# Patient Record
Sex: Female | Born: 1965
Health system: Southern US, Community
[De-identification: ages and names within clinical notes are randomized; demographics above are authoritative.]

## PROBLEM LIST (undated history)

## (undated) DIAGNOSIS — M545 Low back pain, unspecified: Secondary | ICD-10-CM

## (undated) DIAGNOSIS — R000 Tachycardia, unspecified: Secondary | ICD-10-CM

## (undated) DIAGNOSIS — E11621 Type 2 diabetes mellitus with foot ulcer: Secondary | ICD-10-CM

## (undated) DIAGNOSIS — E538 Deficiency of other specified B group vitamins: Secondary | ICD-10-CM

## (undated) DIAGNOSIS — T148XXA Other injury of unspecified body region, initial encounter: Secondary | ICD-10-CM

## (undated) DIAGNOSIS — G5750 Tarsal tunnel syndrome, unspecified lower limb: Secondary | ICD-10-CM

## (undated) DIAGNOSIS — Z8619 Personal history of other infectious and parasitic diseases: Secondary | ICD-10-CM

## (undated) DIAGNOSIS — J449 Chronic obstructive pulmonary disease, unspecified: Secondary | ICD-10-CM

## (undated) DIAGNOSIS — Z794 Long term (current) use of insulin: Secondary | ICD-10-CM

## (undated) DIAGNOSIS — M1990 Unspecified osteoarthritis, unspecified site: Secondary | ICD-10-CM

## (undated) DIAGNOSIS — E039 Hypothyroidism, unspecified: Secondary | ICD-10-CM

## (undated) DIAGNOSIS — Z87898 Personal history of other specified conditions: Secondary | ICD-10-CM

## (undated) DIAGNOSIS — E119 Type 2 diabetes mellitus without complications: Secondary | ICD-10-CM

## (undated) DIAGNOSIS — Z973 Presence of spectacles and contact lenses: Secondary | ICD-10-CM

## (undated) DIAGNOSIS — G629 Polyneuropathy, unspecified: Secondary | ICD-10-CM

## (undated) DIAGNOSIS — F419 Anxiety disorder, unspecified: Secondary | ICD-10-CM

## (undated) DIAGNOSIS — M659 Synovitis and tenosynovitis, unspecified: Secondary | ICD-10-CM

## (undated) DIAGNOSIS — L97519 Non-pressure chronic ulcer of other part of right foot with unspecified severity: Secondary | ICD-10-CM

## (undated) DIAGNOSIS — E785 Hyperlipidemia, unspecified: Secondary | ICD-10-CM

## (undated) DIAGNOSIS — R55 Syncope and collapse: Secondary | ICD-10-CM

## (undated) DIAGNOSIS — I4510 Unspecified right bundle-branch block: Secondary | ICD-10-CM

## (undated) DIAGNOSIS — Z8489 Family history of other specified conditions: Secondary | ICD-10-CM

## (undated) DIAGNOSIS — M5382 Other specified dorsopathies, cervical region: Secondary | ICD-10-CM

## (undated) DIAGNOSIS — G894 Chronic pain syndrome: Secondary | ICD-10-CM

## (undated) DIAGNOSIS — K5909 Other constipation: Secondary | ICD-10-CM

## (undated) HISTORY — DX: Tarsal tunnel syndrome, unspecified lower limb: G57.50

## (undated) HISTORY — DX: Syncope and collapse: R55

## (undated) HISTORY — PX: ABDOMINAL HYSTERECTOMY: SHX81

## (undated) HISTORY — PX: ANTERIOR CERVICAL DECOMP/DISCECTOMY FUSION: SHX1161

## (undated) HISTORY — DX: Synovitis and tenosynovitis, unspecified: M65.9

## (undated) HISTORY — PX: CARDIAC CATHETERIZATION: SHX172

## (undated) HISTORY — PX: HARDWARE REMOVAL: SHX979

## (undated) HISTORY — DX: Anxiety disorder, unspecified: F41.9

## (undated) HISTORY — PX: CERVICAL FUSION: SHX112

## (undated) HISTORY — DX: Type 2 diabetes mellitus without complications: E11.9

## (undated) HISTORY — DX: Hyperlipidemia, unspecified: E78.5

## (undated) HISTORY — DX: Low back pain: M54.5

## (undated) HISTORY — PX: CARPAL TUNNEL RELEASE: SHX101

## (undated) HISTORY — PX: ORIF METATARSAL FRACTURE: SUR942

## (undated) HISTORY — DX: Low back pain, unspecified: M54.50

## (undated) HISTORY — PX: TOTAL ABDOMINAL HYSTERECTOMY W/ BILATERAL SALPINGOOPHORECTOMY: SHX83

## (undated) HISTORY — PX: LUMBAR DISC SURGERY: SHX700

## (undated) HISTORY — DX: Chronic obstructive pulmonary disease, unspecified: J44.9

## (undated) HISTORY — DX: Tachycardia, unspecified: R00.0

## (undated) HISTORY — DX: Hypothyroidism, unspecified: E03.9

## (undated) HISTORY — DX: Polyneuropathy, unspecified: G62.9

---

## 1998-09-08 ENCOUNTER — Other Ambulatory Visit: Admission: RE | Admit: 1998-09-08 | Discharge: 1998-09-08 | Payer: Self-pay | Admitting: Obstetrics and Gynecology

## 1999-04-05 ENCOUNTER — Ambulatory Visit (HOSPITAL_COMMUNITY): Admission: RE | Admit: 1999-04-05 | Discharge: 1999-04-05 | Payer: Self-pay | Admitting: Internal Medicine

## 1999-04-05 ENCOUNTER — Encounter: Payer: Self-pay | Admitting: Internal Medicine

## 1999-11-02 ENCOUNTER — Encounter: Admission: RE | Admit: 1999-11-02 | Discharge: 1999-11-02 | Payer: Self-pay | Admitting: Internal Medicine

## 1999-11-02 ENCOUNTER — Encounter: Payer: Self-pay | Admitting: Internal Medicine

## 2000-01-12 ENCOUNTER — Encounter: Payer: Self-pay | Admitting: Internal Medicine

## 2000-01-12 ENCOUNTER — Ambulatory Visit (HOSPITAL_COMMUNITY): Admission: RE | Admit: 2000-01-12 | Discharge: 2000-01-12 | Payer: Self-pay | Admitting: Internal Medicine

## 2000-02-04 ENCOUNTER — Encounter: Payer: Self-pay | Admitting: Emergency Medicine

## 2000-02-04 ENCOUNTER — Emergency Department (HOSPITAL_COMMUNITY): Admission: RE | Admit: 2000-02-04 | Discharge: 2000-02-04 | Payer: Self-pay | Admitting: Internal Medicine

## 2000-12-28 ENCOUNTER — Encounter: Admission: RE | Admit: 2000-12-28 | Discharge: 2000-12-28 | Payer: Self-pay | Admitting: Obstetrics and Gynecology

## 2000-12-28 ENCOUNTER — Encounter: Payer: Self-pay | Admitting: Obstetrics and Gynecology

## 2001-01-27 ENCOUNTER — Emergency Department (HOSPITAL_COMMUNITY): Admission: EM | Admit: 2001-01-27 | Discharge: 2001-01-27 | Payer: Self-pay | Admitting: Emergency Medicine

## 2001-01-27 ENCOUNTER — Encounter: Payer: Self-pay | Admitting: Emergency Medicine

## 2001-01-29 ENCOUNTER — Emergency Department (HOSPITAL_COMMUNITY): Admission: EM | Admit: 2001-01-29 | Discharge: 2001-01-29 | Payer: Self-pay | Admitting: Emergency Medicine

## 2001-03-15 ENCOUNTER — Encounter: Admission: RE | Admit: 2001-03-15 | Discharge: 2001-06-13 | Payer: Self-pay | Admitting: Internal Medicine

## 2001-05-25 ENCOUNTER — Observation Stay (HOSPITAL_COMMUNITY): Admission: EM | Admit: 2001-05-25 | Discharge: 2001-05-26 | Payer: Self-pay

## 2001-05-26 ENCOUNTER — Encounter: Payer: Self-pay | Admitting: Internal Medicine

## 2001-07-04 ENCOUNTER — Encounter: Admission: RE | Admit: 2001-07-04 | Discharge: 2001-10-02 | Payer: Self-pay | Admitting: Internal Medicine

## 2001-10-18 ENCOUNTER — Encounter: Admission: RE | Admit: 2001-10-18 | Discharge: 2002-01-16 | Payer: Self-pay | Admitting: Internal Medicine

## 2002-01-10 ENCOUNTER — Observation Stay (HOSPITAL_COMMUNITY): Admission: EM | Admit: 2002-01-10 | Discharge: 2002-01-14 | Payer: Self-pay | Admitting: *Deleted

## 2002-01-13 ENCOUNTER — Encounter: Payer: Self-pay | Admitting: Internal Medicine

## 2002-01-17 ENCOUNTER — Inpatient Hospital Stay (HOSPITAL_COMMUNITY): Admission: AD | Admit: 2002-01-17 | Discharge: 2002-01-24 | Payer: Self-pay | Admitting: Internal Medicine

## 2002-03-22 ENCOUNTER — Other Ambulatory Visit: Admission: RE | Admit: 2002-03-22 | Discharge: 2002-03-22 | Payer: Self-pay | Admitting: Obstetrics and Gynecology

## 2002-05-07 ENCOUNTER — Emergency Department (HOSPITAL_COMMUNITY): Admission: EM | Admit: 2002-05-07 | Discharge: 2002-05-07 | Payer: Self-pay | Admitting: Emergency Medicine

## 2002-05-24 ENCOUNTER — Observation Stay (HOSPITAL_COMMUNITY): Admission: EM | Admit: 2002-05-24 | Discharge: 2002-05-25 | Payer: Self-pay | Admitting: Emergency Medicine

## 2002-05-25 ENCOUNTER — Encounter: Payer: Self-pay | Admitting: Cardiology

## 2002-05-27 ENCOUNTER — Encounter: Payer: Self-pay | Admitting: Cardiology

## 2002-05-27 ENCOUNTER — Ambulatory Visit (HOSPITAL_COMMUNITY): Admission: RE | Admit: 2002-05-27 | Discharge: 2002-05-27 | Payer: Self-pay | Admitting: Cardiology

## 2002-06-19 ENCOUNTER — Emergency Department (HOSPITAL_COMMUNITY): Admission: EM | Admit: 2002-06-19 | Discharge: 2002-06-19 | Payer: Self-pay | Admitting: Emergency Medicine

## 2002-06-25 ENCOUNTER — Encounter: Payer: Self-pay | Admitting: Internal Medicine

## 2002-06-25 ENCOUNTER — Encounter: Admission: RE | Admit: 2002-06-25 | Discharge: 2002-06-25 | Payer: Self-pay | Admitting: Internal Medicine

## 2002-07-12 ENCOUNTER — Emergency Department (HOSPITAL_COMMUNITY): Admission: EM | Admit: 2002-07-12 | Discharge: 2002-07-12 | Payer: Self-pay | Admitting: Emergency Medicine

## 2002-12-30 ENCOUNTER — Encounter: Admission: RE | Admit: 2002-12-30 | Discharge: 2003-03-30 | Payer: Self-pay | Admitting: Internal Medicine

## 2004-01-16 ENCOUNTER — Encounter: Admission: RE | Admit: 2004-01-16 | Discharge: 2004-01-16 | Payer: Self-pay | Admitting: Neurology

## 2004-01-17 ENCOUNTER — Encounter: Admission: RE | Admit: 2004-01-17 | Discharge: 2004-01-17 | Payer: Self-pay | Admitting: Neurology

## 2004-02-12 ENCOUNTER — Ambulatory Visit (HOSPITAL_COMMUNITY): Admission: RE | Admit: 2004-02-12 | Discharge: 2004-02-13 | Payer: Self-pay | Admitting: Neurosurgery

## 2004-04-26 ENCOUNTER — Encounter: Admission: RE | Admit: 2004-04-26 | Discharge: 2004-06-29 | Payer: Self-pay | Admitting: Neurosurgery

## 2004-06-23 ENCOUNTER — Other Ambulatory Visit: Admission: RE | Admit: 2004-06-23 | Discharge: 2004-06-23 | Payer: Self-pay | Admitting: Obstetrics and Gynecology

## 2004-07-27 ENCOUNTER — Encounter: Admission: RE | Admit: 2004-07-27 | Discharge: 2004-08-26 | Payer: Self-pay | Admitting: Internal Medicine

## 2004-09-02 ENCOUNTER — Encounter: Admission: RE | Admit: 2004-09-02 | Discharge: 2004-11-15 | Payer: Self-pay | Admitting: General Practice

## 2004-09-13 ENCOUNTER — Ambulatory Visit: Payer: Self-pay | Admitting: Internal Medicine

## 2004-09-13 ENCOUNTER — Ambulatory Visit (HOSPITAL_COMMUNITY): Admission: RE | Admit: 2004-09-13 | Discharge: 2004-09-13 | Payer: Self-pay | Admitting: Internal Medicine

## 2004-11-15 ENCOUNTER — Ambulatory Visit: Payer: Self-pay | Admitting: Internal Medicine

## 2004-11-17 ENCOUNTER — Ambulatory Visit: Payer: Self-pay | Admitting: Internal Medicine

## 2004-11-22 ENCOUNTER — Encounter: Admission: RE | Admit: 2004-11-22 | Discharge: 2005-01-13 | Payer: Self-pay | Admitting: Internal Medicine

## 2005-01-17 ENCOUNTER — Ambulatory Visit: Payer: Self-pay | Admitting: Internal Medicine

## 2005-01-26 ENCOUNTER — Ambulatory Visit: Payer: Self-pay | Admitting: Internal Medicine

## 2005-02-21 ENCOUNTER — Ambulatory Visit: Payer: Self-pay | Admitting: Internal Medicine

## 2005-03-04 ENCOUNTER — Encounter: Admission: RE | Admit: 2005-03-04 | Discharge: 2005-06-02 | Payer: Self-pay | Admitting: Anesthesiology

## 2005-03-21 ENCOUNTER — Ambulatory Visit: Payer: Self-pay | Admitting: Internal Medicine

## 2005-03-25 ENCOUNTER — Ambulatory Visit: Payer: Self-pay | Admitting: Internal Medicine

## 2005-03-28 ENCOUNTER — Emergency Department (HOSPITAL_COMMUNITY): Admission: EM | Admit: 2005-03-28 | Discharge: 2005-03-28 | Payer: Self-pay | Admitting: Emergency Medicine

## 2005-05-16 ENCOUNTER — Ambulatory Visit: Payer: Self-pay | Admitting: Internal Medicine

## 2005-05-19 ENCOUNTER — Ambulatory Visit: Payer: Self-pay

## 2005-05-23 ENCOUNTER — Ambulatory Visit: Payer: Self-pay | Admitting: Internal Medicine

## 2005-05-24 ENCOUNTER — Ambulatory Visit (HOSPITAL_COMMUNITY): Admission: RE | Admit: 2005-05-24 | Discharge: 2005-05-24 | Payer: Self-pay | Admitting: Internal Medicine

## 2005-05-30 ENCOUNTER — Ambulatory Visit: Payer: Self-pay | Admitting: Internal Medicine

## 2005-06-29 ENCOUNTER — Ambulatory Visit: Payer: Self-pay | Admitting: Internal Medicine

## 2005-07-05 ENCOUNTER — Emergency Department (HOSPITAL_COMMUNITY): Admission: EM | Admit: 2005-07-05 | Discharge: 2005-07-06 | Payer: Self-pay | Admitting: *Deleted

## 2005-07-07 ENCOUNTER — Ambulatory Visit (HOSPITAL_COMMUNITY): Admission: RE | Admit: 2005-07-07 | Discharge: 2005-07-08 | Payer: Self-pay | Admitting: Orthopaedic Surgery

## 2005-08-22 ENCOUNTER — Ambulatory Visit: Payer: Self-pay | Admitting: Internal Medicine

## 2005-10-10 ENCOUNTER — Ambulatory Visit: Payer: Self-pay | Admitting: Internal Medicine

## 2005-10-12 ENCOUNTER — Encounter: Admission: RE | Admit: 2005-10-12 | Discharge: 2005-11-28 | Payer: Self-pay | Admitting: Orthopaedic Surgery

## 2005-12-12 ENCOUNTER — Ambulatory Visit: Payer: Self-pay | Admitting: Internal Medicine

## 2006-02-06 ENCOUNTER — Encounter: Admission: RE | Admit: 2006-02-06 | Discharge: 2006-02-06 | Payer: Self-pay | Admitting: Vascular Surgery

## 2006-02-14 ENCOUNTER — Ambulatory Visit: Payer: Self-pay | Admitting: Internal Medicine

## 2006-02-27 ENCOUNTER — Encounter: Admission: RE | Admit: 2006-02-27 | Discharge: 2006-03-16 | Payer: Self-pay | Admitting: Internal Medicine

## 2006-04-24 ENCOUNTER — Encounter: Admission: RE | Admit: 2006-04-24 | Discharge: 2006-04-24 | Payer: Self-pay | Admitting: Orthopaedic Surgery

## 2006-05-29 ENCOUNTER — Ambulatory Visit: Payer: Self-pay | Admitting: Internal Medicine

## 2006-06-09 ENCOUNTER — Ambulatory Visit: Payer: Self-pay | Admitting: Internal Medicine

## 2006-06-30 ENCOUNTER — Ambulatory Visit: Payer: Self-pay | Admitting: Internal Medicine

## 2006-07-07 ENCOUNTER — Inpatient Hospital Stay (HOSPITAL_COMMUNITY): Admission: RE | Admit: 2006-07-07 | Discharge: 2006-07-09 | Payer: Self-pay | Admitting: Orthopaedic Surgery

## 2006-08-28 ENCOUNTER — Ambulatory Visit: Payer: Self-pay | Admitting: Internal Medicine

## 2006-08-28 LAB — CONVERTED CEMR LAB
ALT: 16 units/L (ref 0–40)
AST: 16 units/L (ref 0–37)
BUN: 13 mg/dL (ref 6–23)
Chol/HDL Ratio, serum: 7.2
Cholesterol: 205 mg/dL (ref 0–200)
Cortisol, Plasma: 14.4 ug/dL
Creatinine, Ser: 0.8 mg/dL (ref 0.4–1.2)
Glucose, Bld: 117 mg/dL — ABNORMAL HIGH (ref 70–99)
HDL: 28.5 mg/dL — ABNORMAL LOW (ref >39.0)
Hgb A1c MFr Bld: 6.5 % — ABNORMAL HIGH (ref 4.6–6.0)
LDL DIRECT: 95.4 mg/dL
Potassium: 4.6 meq/L (ref 3.5–5.1)
Sodium: 140 meq/L (ref 135–145)
TSH: 2.98 microintl units/mL (ref 0.35–5.50)
Triglyceride fasting, serum: 400 mg/dL (ref 0–149)
VLDL: 80 mg/dL — ABNORMAL HIGH (ref 0–40)

## 2006-10-02 ENCOUNTER — Ambulatory Visit: Payer: Self-pay | Admitting: Internal Medicine

## 2006-11-27 ENCOUNTER — Ambulatory Visit: Payer: Self-pay | Admitting: Internal Medicine

## 2007-02-26 ENCOUNTER — Ambulatory Visit: Payer: Self-pay | Admitting: Internal Medicine

## 2007-03-01 ENCOUNTER — Ambulatory Visit: Payer: Self-pay | Admitting: Internal Medicine

## 2007-03-01 LAB — CONVERTED CEMR LAB
ALT: 16 units/L (ref 0–40)
AST: 15 units/L (ref 0–37)
Albumin: 3.7 g/dL (ref 3.5–5.2)
Alkaline Phosphatase: 78 units/L (ref 39–117)
BUN: 12 mg/dL (ref 6–23)
Basophils Absolute: 0 10*3/uL (ref 0.0–0.1)
Basophils Relative: 0.1 % (ref 0.0–1.0)
Bilirubin Urine: NEGATIVE
Bilirubin, Direct: 0.1 mg/dL (ref 0.0–0.3)
CO2: 31 meq/L (ref 19–32)
Calcium: 9 mg/dL (ref 8.4–10.5)
Chloride: 102 meq/L (ref 96–112)
Cholesterol: 220 mg/dL (ref 0–200)
Creatinine, Ser: 0.8 mg/dL (ref 0.4–1.2)
Direct LDL: 83 mg/dL
Eosinophils Absolute: 0.2 10*3/uL (ref 0.0–0.6)
Eosinophils Relative: 2.6 % (ref 0.0–5.0)
GFR calc Af Amer: 102 mL/min
GFR calc non Af Amer: 84 mL/min
Glucose, Bld: 101 mg/dL — ABNORMAL HIGH (ref 70–99)
HCT: 41.9 % (ref 36.0–46.0)
HDL: 30.6 mg/dL — ABNORMAL LOW (ref >39.0)
Hemoglobin, Urine: NEGATIVE
Hemoglobin: 14.3 g/dL (ref 12.0–15.0)
Hgb A1c MFr Bld: 6.2 % — ABNORMAL HIGH (ref 4.6–6.0)
Ketones, ur: NEGATIVE mg/dL
Leukocytes, UA: NEGATIVE
Lymphocytes Relative: 28.6 % (ref 12.0–46.0)
MCHC: 34.1 g/dL (ref 30.0–36.0)
MCV: 82.9 fL (ref 78.0–100.0)
Monocytes Absolute: 0.2 10*3/uL (ref 0.2–0.7)
Monocytes Relative: 3 % (ref 3.0–11.0)
Neutro Abs: 5.4 10*3/uL (ref 1.4–7.7)
Neutrophils Relative %: 65.7 % (ref 43.0–77.0)
Nitrite: NEGATIVE
Platelets: 280 10*3/uL (ref 150–400)
Potassium: 4.6 meq/L (ref 3.5–5.1)
RBC: 5.06 M/uL (ref 3.87–5.11)
RDW: 12.7 % (ref 11.5–14.6)
Sodium: 139 meq/L (ref 135–145)
Specific Gravity, Urine: 1.01 (ref 1.000–1.03)
TSH: 2.52 microintl units/mL (ref 0.35–5.50)
Total Bilirubin: 0.6 mg/dL (ref 0.3–1.2)
Total CHOL/HDL Ratio: 7.2
Total Protein, Urine: NEGATIVE mg/dL
Total Protein: 6.8 g/dL (ref 6.0–8.3)
Triglycerides: 563 mg/dL (ref 0–149)
Urine Glucose: NEGATIVE mg/dL
Urobilinogen, UA: 0.2 (ref 0.0–1.0)
VLDL: 113 mg/dL — ABNORMAL HIGH (ref 0–40)
Vit D, 1,25-Dihydroxy: 12 — ABNORMAL LOW (ref 20–57)
WBC: 8.1 10*3/uL (ref 4.5–10.5)
pH: 5.5 (ref 5.0–8.0)

## 2007-05-22 DIAGNOSIS — E039 Hypothyroidism, unspecified: Secondary | ICD-10-CM | POA: Insufficient documentation

## 2007-05-23 ENCOUNTER — Ambulatory Visit: Payer: Self-pay | Admitting: Internal Medicine

## 2007-05-23 LAB — CONVERTED CEMR LAB
ALT: 19 units/L (ref 0–35)
AST: 17 units/L (ref 0–37)
Albumin: 3.8 g/dL (ref 3.5–5.2)
Alkaline Phosphatase: 69 units/L (ref 39–117)
BUN: 13 mg/dL (ref 6–23)
Basophils Absolute: 0 10*3/uL (ref 0.0–0.1)
Basophils Relative: 0.1 % (ref 0.0–1.0)
Bilirubin Urine: NEGATIVE
Bilirubin, Direct: 0.1 mg/dL (ref 0.0–0.3)
CO2: 32 meq/L (ref 19–32)
Calcium: 8.9 mg/dL (ref 8.4–10.5)
Chloride: 104 meq/L (ref 96–112)
Creatinine, Ser: 0.8 mg/dL (ref 0.4–1.2)
Eosinophils Absolute: 0.2 10*3/uL (ref 0.0–0.6)
Eosinophils Relative: 2.1 % (ref 0.0–5.0)
GFR calc Af Amer: 102 mL/min
GFR calc non Af Amer: 84 mL/min
Glucose, Bld: 102 mg/dL — ABNORMAL HIGH (ref 70–99)
HCT: 39.7 % (ref 36.0–46.0)
Hemoglobin, Urine: NEGATIVE
Hemoglobin: 13.7 g/dL (ref 12.0–15.0)
Hgb A1c MFr Bld: 6 % (ref 4.6–6.0)
Ketones, ur: NEGATIVE mg/dL
Leukocytes, UA: NEGATIVE
Lymphocytes Relative: 29.2 % (ref 12.0–46.0)
MCHC: 34.5 g/dL (ref 30.0–36.0)
MCV: 84.9 fL (ref 78.0–100.0)
Monocytes Absolute: 0.5 10*3/uL (ref 0.2–0.7)
Monocytes Relative: 5.3 % (ref 3.0–11.0)
Neutro Abs: 6.5 10*3/uL (ref 1.4–7.7)
Neutrophils Relative %: 63.3 % (ref 43.0–77.0)
Nitrite: NEGATIVE
Platelets: 266 10*3/uL (ref 150–400)
Potassium: 3.8 meq/L (ref 3.5–5.1)
RBC: 4.68 M/uL (ref 3.87–5.11)
RDW: 12.4 % (ref 11.5–14.6)
Sed Rate: 19 mm/hr (ref 0–25)
Sodium: 142 meq/L (ref 135–145)
Specific Gravity, Urine: >=1.03 (ref 1.000–1.03)
TSH: 1.52 microintl units/mL (ref 0.35–5.50)
Total Bilirubin: 0.6 mg/dL (ref 0.3–1.2)
Total Protein, Urine: NEGATIVE mg/dL
Total Protein: 7.1 g/dL (ref 6.0–8.3)
Urine Glucose: NEGATIVE mg/dL
Urobilinogen, UA: 0.2 (ref 0.0–1.0)
Vitamin B-12: 303 pg/mL (ref 211–911)
WBC: 10.1 10*3/uL (ref 4.5–10.5)
pH: 5.5 (ref 5.0–8.0)

## 2007-06-19 ENCOUNTER — Ambulatory Visit: Payer: Self-pay | Admitting: Endocrinology

## 2007-08-20 ENCOUNTER — Ambulatory Visit: Payer: Self-pay | Admitting: Internal Medicine

## 2007-08-20 DIAGNOSIS — E785 Hyperlipidemia, unspecified: Secondary | ICD-10-CM | POA: Insufficient documentation

## 2007-08-21 ENCOUNTER — Encounter: Payer: Self-pay | Admitting: Internal Medicine

## 2007-08-27 ENCOUNTER — Encounter: Payer: Self-pay | Admitting: Internal Medicine

## 2007-08-27 DIAGNOSIS — M545 Low back pain, unspecified: Secondary | ICD-10-CM | POA: Insufficient documentation

## 2007-08-27 DIAGNOSIS — E1140 Type 2 diabetes mellitus with diabetic neuropathy, unspecified: Secondary | ICD-10-CM | POA: Insufficient documentation

## 2007-10-03 ENCOUNTER — Telehealth: Payer: Self-pay | Admitting: Internal Medicine

## 2007-11-06 ENCOUNTER — Encounter: Payer: Self-pay | Admitting: Internal Medicine

## 2007-11-13 ENCOUNTER — Ambulatory Visit: Payer: Self-pay | Admitting: Internal Medicine

## 2007-11-13 DIAGNOSIS — J019 Acute sinusitis, unspecified: Secondary | ICD-10-CM | POA: Insufficient documentation

## 2007-11-13 DIAGNOSIS — J441 Chronic obstructive pulmonary disease with (acute) exacerbation: Secondary | ICD-10-CM | POA: Insufficient documentation

## 2007-12-10 ENCOUNTER — Ambulatory Visit: Payer: Self-pay | Admitting: Internal Medicine

## 2008-04-01 ENCOUNTER — Ambulatory Visit: Payer: Self-pay | Admitting: Oncology

## 2008-04-07 ENCOUNTER — Encounter: Payer: Self-pay | Admitting: Internal Medicine

## 2008-04-18 ENCOUNTER — Ambulatory Visit: Payer: Self-pay | Admitting: Internal Medicine

## 2008-04-18 LAB — CONVERTED CEMR LAB: Vit D, 1,25-Dihydroxy: 17 — ABNORMAL LOW (ref 30–89)

## 2008-04-21 ENCOUNTER — Ambulatory Visit: Payer: Self-pay | Admitting: Internal Medicine

## 2008-04-21 DIAGNOSIS — M79609 Pain in unspecified limb: Secondary | ICD-10-CM | POA: Insufficient documentation

## 2008-04-21 DIAGNOSIS — F17200 Nicotine dependence, unspecified, uncomplicated: Secondary | ICD-10-CM | POA: Insufficient documentation

## 2008-04-21 DIAGNOSIS — R109 Unspecified abdominal pain: Secondary | ICD-10-CM | POA: Insufficient documentation

## 2008-04-21 DIAGNOSIS — R209 Unspecified disturbances of skin sensation: Secondary | ICD-10-CM | POA: Insufficient documentation

## 2008-04-21 LAB — CONVERTED CEMR LAB
BUN: 13 mg/dL (ref 6–23)
CO2: 32 meq/L (ref 19–32)
Calcium: 8.9 mg/dL (ref 8.4–10.5)
Chloride: 101 meq/L (ref 96–112)
Creatinine, Ser: 0.8 mg/dL (ref 0.4–1.2)
GFR calc Af Amer: 101 mL/min
GFR calc non Af Amer: 84 mL/min
Glucose, Bld: 98 mg/dL (ref 70–99)
Hgb A1c MFr Bld: 6.7 % — ABNORMAL HIGH (ref 4.6–6.0)
Potassium: 4.3 meq/L (ref 3.5–5.1)
Sodium: 138 meq/L (ref 135–145)
TSH: 1.21 microintl units/mL (ref 0.35–5.50)
Vitamin B-12: 363 pg/mL (ref 211–911)

## 2008-04-22 LAB — CONVERTED CEMR LAB
Bilirubin Urine: NEGATIVE
Hemoglobin, Urine: NEGATIVE
Ketones, ur: NEGATIVE mg/dL
Leukocytes, UA: NEGATIVE
Nitrite: NEGATIVE
Specific Gravity, Urine: >=1.03 (ref 1.000–1.03)
Total Protein, Urine: NEGATIVE mg/dL
Urine Glucose: NEGATIVE mg/dL
Urobilinogen, UA: 0.2 (ref 0.0–1.0)
pH: 5 (ref 5.0–8.0)

## 2008-04-23 ENCOUNTER — Encounter: Payer: Self-pay | Admitting: Internal Medicine

## 2008-05-16 ENCOUNTER — Telehealth: Payer: Self-pay | Admitting: Internal Medicine

## 2008-05-24 ENCOUNTER — Encounter: Admission: RE | Admit: 2008-05-24 | Discharge: 2008-05-24 | Payer: Self-pay | Admitting: Orthopaedic Surgery

## 2008-05-29 ENCOUNTER — Telehealth: Payer: Self-pay | Admitting: Internal Medicine

## 2008-07-21 ENCOUNTER — Ambulatory Visit: Payer: Self-pay | Admitting: Internal Medicine

## 2008-07-21 LAB — CONVERTED CEMR LAB
ALT: 17 units/L (ref 0–35)
AST: 18 units/L (ref 0–37)
Albumin: 3.7 g/dL (ref 3.5–5.2)
Alkaline Phosphatase: 59 units/L (ref 39–117)
Basophils Absolute: 0 10*3/uL (ref 0.0–0.1)
Basophils Relative: 0.6 % (ref 0.0–3.0)
Bilirubin, Direct: 0.1 mg/dL (ref 0.0–0.3)
Cholesterol: 250 mg/dL (ref 0–200)
Direct LDL: 100.6 mg/dL
Eosinophils Absolute: 0.2 10*3/uL (ref 0.0–0.7)
Eosinophils Relative: 3 % (ref 0.0–5.0)
HCT: 41 % (ref 36.0–46.0)
HDL: 28.9 mg/dL — ABNORMAL LOW (ref >39.0)
Hemoglobin: 14.4 g/dL (ref 12.0–15.0)
Hgb A1c MFr Bld: 6.4 % — ABNORMAL HIGH (ref 4.6–6.0)
Lymphocytes Relative: 51.3 % — ABNORMAL HIGH (ref 12.0–46.0)
MCHC: 35 g/dL (ref 30.0–36.0)
MCV: 87.2 fL (ref 78.0–100.0)
Monocytes Absolute: 0.5 10*3/uL (ref 0.1–1.0)
Monocytes Relative: 8.4 % (ref 3.0–12.0)
Neutro Abs: 2.3 10*3/uL (ref 1.4–7.7)
Neutrophils Relative %: 36.7 % — ABNORMAL LOW (ref 43.0–77.0)
Platelets: 243 10*3/uL (ref 150–400)
RBC: 4.71 M/uL (ref 3.87–5.11)
RDW: 11.4 % — ABNORMAL LOW (ref 11.5–14.6)
Total Bilirubin: 0.6 mg/dL (ref 0.3–1.2)
Total CHOL/HDL Ratio: 8.7
Total Protein: 7.1 g/dL (ref 6.0–8.3)
Triglycerides: 556 mg/dL (ref 0–149)
VLDL: 111 mg/dL — ABNORMAL HIGH (ref 0–40)
WBC: 6.4 10*3/uL (ref 4.5–10.5)

## 2008-07-28 ENCOUNTER — Ambulatory Visit: Payer: Self-pay | Admitting: Internal Medicine

## 2008-07-28 DIAGNOSIS — M25529 Pain in unspecified elbow: Secondary | ICD-10-CM | POA: Insufficient documentation

## 2008-07-28 DIAGNOSIS — F419 Anxiety disorder, unspecified: Secondary | ICD-10-CM | POA: Insufficient documentation

## 2008-07-28 DIAGNOSIS — F411 Generalized anxiety disorder: Secondary | ICD-10-CM

## 2008-10-06 ENCOUNTER — Ambulatory Visit: Payer: Self-pay | Admitting: Internal Medicine

## 2008-10-13 ENCOUNTER — Encounter: Payer: Self-pay | Admitting: Internal Medicine

## 2008-10-20 ENCOUNTER — Encounter: Payer: Self-pay | Admitting: Internal Medicine

## 2008-10-31 DIAGNOSIS — G5750 Tarsal tunnel syndrome, unspecified lower limb: Secondary | ICD-10-CM

## 2008-10-31 HISTORY — DX: Tarsal tunnel syndrome, unspecified lower limb: G57.50

## 2008-10-31 HISTORY — PX: CARPAL TUNNEL RELEASE: SHX101

## 2008-11-03 ENCOUNTER — Ambulatory Visit: Payer: Self-pay | Admitting: Internal Medicine

## 2008-11-04 ENCOUNTER — Encounter: Payer: Self-pay | Admitting: Internal Medicine

## 2008-11-05 ENCOUNTER — Ambulatory Visit: Payer: Self-pay

## 2008-12-01 ENCOUNTER — Encounter: Payer: Self-pay | Admitting: Internal Medicine

## 2008-12-25 ENCOUNTER — Ambulatory Visit (HOSPITAL_BASED_OUTPATIENT_CLINIC_OR_DEPARTMENT_OTHER): Admission: RE | Admit: 2008-12-25 | Discharge: 2008-12-25 | Payer: Self-pay | Admitting: Orthopedic Surgery

## 2008-12-29 ENCOUNTER — Ambulatory Visit: Payer: Self-pay | Admitting: Internal Medicine

## 2008-12-30 LAB — CONVERTED CEMR LAB
ALT: 32 units/L (ref 0–35)
AST: 32 units/L (ref 0–37)
Albumin: 3.9 g/dL (ref 3.5–5.2)
Alkaline Phosphatase: 78 units/L (ref 39–117)
BUN: 16 mg/dL (ref 6–23)
Bilirubin, Direct: 0.2 mg/dL (ref 0.0–0.3)
CO2: 30 meq/L (ref 19–32)
Calcium: 9.3 mg/dL (ref 8.4–10.5)
Chloride: 97 meq/L (ref 96–112)
Cholesterol: 214 mg/dL (ref 0–200)
Creatinine, Ser: 0.7 mg/dL (ref 0.4–1.2)
Direct LDL: 96.7 mg/dL
GFR calc Af Amer: 117 mL/min
GFR calc non Af Amer: 97 mL/min
Glucose, Bld: 193 mg/dL — ABNORMAL HIGH (ref 70–99)
HDL: 41.4 mg/dL (ref >39.0)
Hgb A1c MFr Bld: 8 % — ABNORMAL HIGH (ref 4.6–6.0)
Potassium: 4.6 meq/L (ref 3.5–5.1)
Sodium: 136 meq/L (ref 135–145)
TSH: 3.37 microintl units/mL (ref 0.35–5.50)
Total Bilirubin: 0.9 mg/dL (ref 0.3–1.2)
Total CHOL/HDL Ratio: 5.2
Total Protein: 7.3 g/dL (ref 6.0–8.3)
Triglycerides: 403 mg/dL (ref 0–149)
VLDL: 81 mg/dL — ABNORMAL HIGH (ref 0–40)
Vitamin B-12: 299 pg/mL (ref 211–911)

## 2009-01-05 ENCOUNTER — Ambulatory Visit: Payer: Self-pay | Admitting: Internal Medicine

## 2009-01-05 DIAGNOSIS — R609 Edema, unspecified: Secondary | ICD-10-CM | POA: Insufficient documentation

## 2009-01-05 DIAGNOSIS — E538 Deficiency of other specified B group vitamins: Secondary | ICD-10-CM | POA: Insufficient documentation

## 2009-01-05 DIAGNOSIS — M109 Gout, unspecified: Secondary | ICD-10-CM | POA: Insufficient documentation

## 2009-01-07 ENCOUNTER — Encounter: Payer: Self-pay | Admitting: Internal Medicine

## 2009-01-12 ENCOUNTER — Encounter: Payer: Self-pay | Admitting: Internal Medicine

## 2009-02-09 ENCOUNTER — Encounter: Payer: Self-pay | Admitting: Internal Medicine

## 2009-03-24 ENCOUNTER — Encounter: Payer: Self-pay | Admitting: Internal Medicine

## 2009-04-16 ENCOUNTER — Encounter: Payer: Self-pay | Admitting: Internal Medicine

## 2009-04-20 ENCOUNTER — Ambulatory Visit: Payer: Self-pay | Admitting: Internal Medicine

## 2009-04-20 LAB — CONVERTED CEMR LAB
ALT: 19 units/L (ref 0–35)
AST: 20 units/L (ref 0–37)
Albumin: 4.1 g/dL (ref 3.5–5.2)
Alkaline Phosphatase: 82 units/L (ref 39–117)
BUN: 17 mg/dL (ref 6–23)
Basophils Absolute: 0.2 10*3/uL — ABNORMAL HIGH (ref 0.0–0.1)
Basophils Relative: 2.2 % (ref 0.0–3.0)
Bilirubin, Direct: 0.1 mg/dL (ref 0.0–0.3)
CO2: 31 meq/L (ref 19–32)
Calcium: 9.4 mg/dL (ref 8.4–10.5)
Chloride: 100 meq/L (ref 96–112)
Creatinine, Ser: 0.7 mg/dL (ref 0.4–1.2)
Eosinophils Absolute: 0.1 10*3/uL (ref 0.0–0.7)
Eosinophils Relative: 1.3 % (ref 0.0–5.0)
GFR calc non Af Amer: 96.89 mL/min (ref >60)
Glucose, Bld: 129 mg/dL — ABNORMAL HIGH (ref 70–99)
HCT: 45.5 % (ref 36.0–46.0)
Hemoglobin: 15.9 g/dL — ABNORMAL HIGH (ref 12.0–15.0)
Hgb A1c MFr Bld: 7.5 % — ABNORMAL HIGH (ref 4.6–6.5)
Lymphocytes Relative: 32.9 % (ref 12.0–46.0)
Lymphs Abs: 3.6 10*3/uL (ref 0.7–4.0)
MCHC: 34.9 g/dL (ref 30.0–36.0)
MCV: 86.4 fL (ref 78.0–100.0)
Monocytes Absolute: 0.7 10*3/uL (ref 0.1–1.0)
Monocytes Relative: 6.3 % (ref 3.0–12.0)
Neutro Abs: 6.2 10*3/uL (ref 1.4–7.7)
Neutrophils Relative %: 57.3 % (ref 43.0–77.0)
Platelets: 256 10*3/uL (ref 150.0–400.0)
Potassium: 4 meq/L (ref 3.5–5.1)
RBC: 5.27 M/uL — ABNORMAL HIGH (ref 3.87–5.11)
RDW: 12.6 % (ref 11.5–14.6)
Sodium: 140 meq/L (ref 135–145)
TSH: 1.52 microintl units/mL (ref 0.35–5.50)
Total Bilirubin: 0.8 mg/dL (ref 0.3–1.2)
Total Protein: 7.9 g/dL (ref 6.0–8.3)
WBC: 10.8 10*3/uL — ABNORMAL HIGH (ref 4.5–10.5)

## 2009-05-01 ENCOUNTER — Ambulatory Visit: Payer: Self-pay | Admitting: Internal Medicine

## 2009-05-01 DIAGNOSIS — R799 Abnormal finding of blood chemistry, unspecified: Secondary | ICD-10-CM | POA: Insufficient documentation

## 2009-05-05 ENCOUNTER — Telehealth: Payer: Self-pay | Admitting: Internal Medicine

## 2009-05-05 LAB — CONVERTED CEMR LAB
Bilirubin Urine: NEGATIVE
Hemoglobin, Urine: NEGATIVE
Ketones, ur: NEGATIVE mg/dL
Leukocytes, UA: NEGATIVE
Nitrite: NEGATIVE
Sed Rate: 40 mm/hr — ABNORMAL HIGH (ref 0–22)
Specific Gravity, Urine: 1.015 (ref 1.000–1.030)
Total Protein, Urine: NEGATIVE mg/dL
Urine Glucose: NEGATIVE mg/dL
Urobilinogen, UA: 0.2 (ref 0.0–1.0)
pH: 5 (ref 5.0–8.0)

## 2009-05-07 ENCOUNTER — Telehealth: Payer: Self-pay | Admitting: Internal Medicine

## 2009-05-08 ENCOUNTER — Encounter: Payer: Self-pay | Admitting: Internal Medicine

## 2009-06-09 ENCOUNTER — Encounter: Payer: Self-pay | Admitting: Internal Medicine

## 2009-07-27 ENCOUNTER — Encounter: Payer: Self-pay | Admitting: Internal Medicine

## 2009-07-29 ENCOUNTER — Telehealth: Payer: Self-pay | Admitting: Internal Medicine

## 2009-07-30 ENCOUNTER — Telehealth: Payer: Self-pay | Admitting: Internal Medicine

## 2009-09-07 ENCOUNTER — Ambulatory Visit: Payer: Self-pay | Admitting: Internal Medicine

## 2009-09-09 LAB — CONVERTED CEMR LAB
ALT: 22 units/L (ref 0–35)
AST: 19 units/L (ref 0–37)
Albumin: 3.8 g/dL (ref 3.5–5.2)
Alkaline Phosphatase: 87 units/L (ref 39–117)
BUN: 9 mg/dL (ref 6–23)
Basophils Absolute: 0.2 10*3/uL — ABNORMAL HIGH (ref 0.0–0.1)
Basophils Relative: 2.3 % (ref 0.0–3.0)
Bilirubin Urine: NEGATIVE
Bilirubin, Direct: 0.1 mg/dL (ref 0.0–0.3)
CO2: 32 meq/L (ref 19–32)
Calcium: 9.2 mg/dL (ref 8.4–10.5)
Chloride: 101 meq/L (ref 96–112)
Cholesterol: 151 mg/dL (ref 0–200)
Creatinine, Ser: 0.8 mg/dL (ref 0.4–1.2)
Direct LDL: 86.5 mg/dL
Eosinophils Absolute: 0.2 10*3/uL (ref 0.0–0.7)
Eosinophils Relative: 1.6 % (ref 0.0–5.0)
GFR calc non Af Amer: 82.91 mL/min (ref >60)
Glucose, Bld: 163 mg/dL — ABNORMAL HIGH (ref 70–99)
HCT: 43 % (ref 36.0–46.0)
HDL: 32.6 mg/dL — ABNORMAL LOW (ref >39.00)
Hemoglobin, Urine: NEGATIVE
Hemoglobin: 14.8 g/dL (ref 12.0–15.0)
Ketones, ur: NEGATIVE mg/dL
Leukocytes, UA: NEGATIVE
Lymphocytes Relative: 31.2 % (ref 12.0–46.0)
Lymphs Abs: 2.9 10*3/uL (ref 0.7–4.0)
MCHC: 34.5 g/dL (ref 30.0–36.0)
MCV: 87.4 fL (ref 78.0–100.0)
Monocytes Absolute: 0.5 10*3/uL (ref 0.1–1.0)
Monocytes Relative: 4.8 % (ref 3.0–12.0)
Neutro Abs: 5.6 10*3/uL (ref 1.4–7.7)
Neutrophils Relative %: 60.1 % (ref 43.0–77.0)
Nitrite: NEGATIVE
Platelets: 264 10*3/uL (ref 150.0–400.0)
Potassium: 4.3 meq/L (ref 3.5–5.1)
RBC: 4.92 M/uL (ref 3.87–5.11)
RDW: 13 % (ref 11.5–14.6)
Sed Rate: 23 mm/hr — ABNORMAL HIGH (ref 0–22)
Sodium: 141 meq/L (ref 135–145)
Specific Gravity, Urine: 1.01 (ref 1.000–1.030)
TSH: 0.81 microintl units/mL (ref 0.35–5.50)
Total Bilirubin: 0.5 mg/dL (ref 0.3–1.2)
Total CHOL/HDL Ratio: 5
Total Protein, Urine: NEGATIVE mg/dL
Total Protein: 7.5 g/dL (ref 6.0–8.3)
Triglycerides: 295 mg/dL — ABNORMAL HIGH (ref 0.0–149.0)
Urine Glucose: 250 mg/dL
Urobilinogen, UA: 0.2 (ref 0.0–1.0)
VLDL: 59 mg/dL — ABNORMAL HIGH (ref 0.0–40.0)
Vitamin B-12: 303 pg/mL (ref 211–911)
WBC: 9.4 10*3/uL (ref 4.5–10.5)
pH: 5.5 (ref 5.0–8.0)

## 2009-09-22 ENCOUNTER — Ambulatory Visit (HOSPITAL_BASED_OUTPATIENT_CLINIC_OR_DEPARTMENT_OTHER): Admission: RE | Admit: 2009-09-22 | Discharge: 2009-09-22 | Payer: Self-pay | Admitting: Orthopedic Surgery

## 2009-09-29 ENCOUNTER — Ambulatory Visit: Payer: Self-pay | Admitting: Internal Medicine

## 2009-09-29 LAB — CONVERTED CEMR LAB
ALT: 14 units/L (ref 0–35)
AST: 12 units/L (ref 0–37)
Albumin: 3.6 g/dL (ref 3.5–5.2)
Alkaline Phosphatase: 80 units/L (ref 39–117)
BUN: 14 mg/dL (ref 6–23)
Basophils Absolute: 0.1 10*3/uL (ref 0.0–0.1)
Basophils Relative: 1.6 % (ref 0.0–3.0)
Bilirubin Urine: NEGATIVE
Bilirubin, Direct: 0.1 mg/dL (ref 0.0–0.3)
CO2: 31 meq/L (ref 19–32)
Calcium: 8.8 mg/dL (ref 8.4–10.5)
Chloride: 103 meq/L (ref 96–112)
Cholesterol: 137 mg/dL (ref 0–200)
Creatinine, Ser: 0.8 mg/dL (ref 0.4–1.2)
Direct LDL: 74.6 mg/dL
Eosinophils Absolute: 0.1 10*3/uL (ref 0.0–0.7)
Eosinophils Relative: 1.9 % (ref 0.0–5.0)
GFR calc non Af Amer: 82.88 mL/min (ref >60)
Glucose, Bld: 124 mg/dL — ABNORMAL HIGH (ref 70–99)
HCT: 44.5 % (ref 36.0–46.0)
HDL: 30.2 mg/dL — ABNORMAL LOW (ref >39.00)
Hemoglobin, Urine: NEGATIVE
Hemoglobin: 15.1 g/dL — ABNORMAL HIGH (ref 12.0–15.0)
Ketones, ur: NEGATIVE mg/dL
Leukocytes, UA: NEGATIVE
Lymphocytes Relative: 32 % (ref 12.0–46.0)
Lymphs Abs: 2.4 10*3/uL (ref 0.7–4.0)
MCHC: 33.8 g/dL (ref 30.0–36.0)
MCV: 87.5 fL (ref 78.0–100.0)
Monocytes Absolute: 0.4 10*3/uL (ref 0.1–1.0)
Monocytes Relative: 4.7 % (ref 3.0–12.0)
Neutro Abs: 4.5 10*3/uL (ref 1.4–7.7)
Neutrophils Relative %: 59.8 % (ref 43.0–77.0)
Nitrite: NEGATIVE
Platelets: 251 10*3/uL (ref 150.0–400.0)
Potassium: 4.5 meq/L (ref 3.5–5.1)
RBC: 5.09 M/uL (ref 3.87–5.11)
RDW: 12.4 % (ref 11.5–14.6)
Sodium: 139 meq/L (ref 135–145)
Specific Gravity, Urine: <=1.005 (ref 1.000–1.030)
TSH: 2.25 microintl units/mL (ref 0.35–5.50)
Total Bilirubin: 0.5 mg/dL (ref 0.3–1.2)
Total CHOL/HDL Ratio: 5
Total Protein, Urine: NEGATIVE mg/dL
Total Protein: 7.3 g/dL (ref 6.0–8.3)
Triglycerides: 221 mg/dL — ABNORMAL HIGH (ref 0.0–149.0)
Urine Glucose: NEGATIVE mg/dL
Urobilinogen, UA: 0.2 (ref 0.0–1.0)
VLDL: 44.2 mg/dL — ABNORMAL HIGH (ref 0.0–40.0)
WBC: 7.5 10*3/uL (ref 4.5–10.5)
pH: 5 (ref 5.0–8.0)

## 2009-10-02 ENCOUNTER — Ambulatory Visit: Payer: Self-pay | Admitting: Internal Medicine

## 2009-10-02 DIAGNOSIS — R000 Tachycardia, unspecified: Secondary | ICD-10-CM | POA: Insufficient documentation

## 2009-10-05 ENCOUNTER — Telehealth: Payer: Self-pay | Admitting: Internal Medicine

## 2009-10-27 ENCOUNTER — Encounter: Payer: Self-pay | Admitting: Internal Medicine

## 2009-11-03 ENCOUNTER — Telehealth: Payer: Self-pay | Admitting: Internal Medicine

## 2009-12-15 ENCOUNTER — Encounter: Payer: Self-pay | Admitting: Internal Medicine

## 2009-12-16 ENCOUNTER — Telehealth: Payer: Self-pay | Admitting: Internal Medicine

## 2009-12-21 ENCOUNTER — Encounter: Payer: Self-pay | Admitting: Internal Medicine

## 2009-12-25 ENCOUNTER — Telehealth: Payer: Self-pay | Admitting: Internal Medicine

## 2009-12-28 ENCOUNTER — Telehealth: Payer: Self-pay | Admitting: Internal Medicine

## 2009-12-29 ENCOUNTER — Encounter: Payer: Self-pay | Admitting: Internal Medicine

## 2010-01-04 ENCOUNTER — Ambulatory Visit: Payer: Self-pay | Admitting: Internal Medicine

## 2010-01-04 DIAGNOSIS — R635 Abnormal weight gain: Secondary | ICD-10-CM | POA: Insufficient documentation

## 2010-01-04 DIAGNOSIS — M2550 Pain in unspecified joint: Secondary | ICD-10-CM | POA: Insufficient documentation

## 2010-01-07 LAB — CONVERTED CEMR LAB
ALT: 20 units/L (ref 0–35)
AST: 18 units/L (ref 0–37)
Albumin: 4 g/dL (ref 3.5–5.2)
Alkaline Phosphatase: 68 units/L (ref 39–117)
BUN: 12 mg/dL (ref 6–23)
Bilirubin, Direct: 0.1 mg/dL (ref 0.0–0.3)
CK-MB: 1.2 ng/mL (ref 0.3–4.0)
CO2: 31 meq/L (ref 19–32)
Calcium: 9.1 mg/dL (ref 8.4–10.5)
Chloride: 106 meq/L (ref 96–112)
Creatinine, Ser: 0.8 mg/dL (ref 0.4–1.2)
GFR calc non Af Amer: 82.78 mL/min (ref >60)
Glucose, Bld: 130 mg/dL — ABNORMAL HIGH (ref 70–99)
Hgb A1c MFr Bld: 7 % — ABNORMAL HIGH (ref 4.6–6.5)
Potassium: 4.5 meq/L (ref 3.5–5.1)
Sodium: 141 meq/L (ref 135–145)
TSH: 1.66 microintl units/mL (ref 0.35–5.50)
Total Bilirubin: 0.5 mg/dL (ref 0.3–1.2)
Total Protein: 7.6 g/dL (ref 6.0–8.3)
Vit D, 25-Hydroxy: 18 ng/mL — ABNORMAL LOW (ref 30–89)
Vitamin B-12: 258 pg/mL (ref 211–911)

## 2010-01-14 ENCOUNTER — Encounter: Payer: Self-pay | Admitting: Internal Medicine

## 2010-01-20 ENCOUNTER — Encounter: Payer: Self-pay | Admitting: Internal Medicine

## 2010-03-31 ENCOUNTER — Encounter: Payer: Self-pay | Admitting: Internal Medicine

## 2010-04-12 ENCOUNTER — Telehealth: Payer: Self-pay | Admitting: Internal Medicine

## 2010-04-12 ENCOUNTER — Ambulatory Visit: Payer: Self-pay | Admitting: Internal Medicine

## 2010-04-14 ENCOUNTER — Telehealth: Payer: Self-pay | Admitting: Internal Medicine

## 2010-05-05 ENCOUNTER — Ambulatory Visit: Payer: Self-pay | Admitting: Internal Medicine

## 2010-05-17 ENCOUNTER — Encounter: Payer: Self-pay | Admitting: Internal Medicine

## 2010-07-01 ENCOUNTER — Telehealth: Payer: Self-pay | Admitting: Internal Medicine

## 2010-07-19 ENCOUNTER — Ambulatory Visit: Payer: Self-pay | Admitting: Internal Medicine

## 2010-07-20 LAB — CONVERTED CEMR LAB
ALT: 15 units/L (ref 0–35)
AST: 15 units/L (ref 0–37)
Albumin: 3.7 g/dL (ref 3.5–5.2)
Alkaline Phosphatase: 82 units/L (ref 39–117)
BUN: 15 mg/dL (ref 6–23)
Basophils Absolute: 0 10*3/uL (ref 0.0–0.1)
Basophils Relative: 0.6 % (ref 0.0–3.0)
Bilirubin, Direct: 0.1 mg/dL (ref 0.0–0.3)
CO2: 33 meq/L — ABNORMAL HIGH (ref 19–32)
Calcium: 9.2 mg/dL (ref 8.4–10.5)
Chloride: 98 meq/L (ref 96–112)
Creatinine, Ser: 0.8 mg/dL (ref 0.4–1.2)
Eosinophils Absolute: 0.1 10*3/uL (ref 0.0–0.7)
Eosinophils Relative: 1.9 % (ref 0.0–5.0)
GFR calc non Af Amer: 85.03 mL/min (ref >60)
Glucose, Bld: 189 mg/dL — ABNORMAL HIGH (ref 70–99)
HCT: 43.2 % (ref 36.0–46.0)
Hemoglobin: 14.9 g/dL (ref 12.0–15.0)
Lymphocytes Relative: 36.7 % (ref 12.0–46.0)
Lymphs Abs: 2.4 10*3/uL (ref 0.7–4.0)
MCHC: 34.5 g/dL (ref 30.0–36.0)
MCV: 87.8 fL (ref 78.0–100.0)
Monocytes Absolute: 0.4 10*3/uL (ref 0.1–1.0)
Monocytes Relative: 6.1 % (ref 3.0–12.0)
Neutro Abs: 3.6 10*3/uL (ref 1.4–7.7)
Neutrophils Relative %: 54.7 % (ref 43.0–77.0)
Platelets: 236 10*3/uL (ref 150.0–400.0)
Potassium: 4.6 meq/L (ref 3.5–5.1)
RBC: 4.92 M/uL (ref 3.87–5.11)
RDW: 13.4 % (ref 11.5–14.6)
Sed Rate: 24 mm/hr — ABNORMAL HIGH (ref 0–22)
Sodium: 137 meq/L (ref 135–145)
TSH: 1.32 microintl units/mL (ref 0.35–5.50)
Total Bilirubin: 0.4 mg/dL (ref 0.3–1.2)
Total Protein: 6.7 g/dL (ref 6.0–8.3)
Uric Acid, Serum: 4.3 mg/dL (ref 2.4–7.0)
WBC: 6.6 10*3/uL (ref 4.5–10.5)

## 2010-08-05 ENCOUNTER — Telehealth: Payer: Self-pay | Admitting: Internal Medicine

## 2010-09-30 ENCOUNTER — Telehealth (INDEPENDENT_AMBULATORY_CARE_PROVIDER_SITE_OTHER): Payer: Self-pay | Admitting: *Deleted

## 2010-10-11 ENCOUNTER — Ambulatory Visit: Payer: Self-pay | Admitting: Internal Medicine

## 2010-10-18 ENCOUNTER — Ambulatory Visit: Payer: Self-pay | Admitting: Internal Medicine

## 2010-10-18 DIAGNOSIS — K5289 Other specified noninfective gastroenteritis and colitis: Secondary | ICD-10-CM | POA: Insufficient documentation

## 2010-10-20 LAB — CONVERTED CEMR LAB
BUN: 17 mg/dL (ref 6–23)
Basophils Absolute: 0 10*3/uL (ref 0.0–0.1)
Basophils Relative: 0.5 % (ref 0.0–3.0)
CO2: 32 meq/L (ref 19–32)
Calcium: 9.7 mg/dL (ref 8.4–10.5)
Chloride: 96 meq/L (ref 96–112)
Creatinine, Ser: 0.9 mg/dL (ref 0.4–1.2)
Eosinophils Absolute: 0.2 10*3/uL (ref 0.0–0.7)
Eosinophils Relative: 1.7 % (ref 0.0–5.0)
GFR calc non Af Amer: 71.09 mL/min (ref >60.00)
Glucose, Bld: 276 mg/dL — ABNORMAL HIGH (ref 70–99)
HCT: 45.8 % (ref 36.0–46.0)
Hemoglobin: 15.8 g/dL — ABNORMAL HIGH (ref 12.0–15.0)
Hgb A1c MFr Bld: 7.6 % — ABNORMAL HIGH (ref 4.6–6.5)
Lymphocytes Relative: 28.7 % (ref 12.0–46.0)
Lymphs Abs: 2.6 10*3/uL (ref 0.7–4.0)
MCHC: 34.5 g/dL (ref 30.0–36.0)
MCV: 88.1 fL (ref 78.0–100.0)
Monocytes Absolute: 0.5 10*3/uL (ref 0.1–1.0)
Monocytes Relative: 5.7 % (ref 3.0–12.0)
Neutro Abs: 5.8 10*3/uL (ref 1.4–7.7)
Neutrophils Relative %: 63.4 % (ref 43.0–77.0)
Platelets: 287 10*3/uL (ref 150.0–400.0)
Potassium: 4.4 meq/L (ref 3.5–5.1)
RBC: 5.2 M/uL — ABNORMAL HIGH (ref 3.87–5.11)
RDW: 13.4 % (ref 11.5–14.6)
Sodium: 137 meq/L (ref 135–145)
Vitamin B-12: 819 pg/mL (ref 211–911)
WBC: 9.1 10*3/uL (ref 4.5–10.5)

## 2010-10-29 ENCOUNTER — Telehealth: Payer: Self-pay | Admitting: Internal Medicine

## 2010-11-04 ENCOUNTER — Encounter: Payer: Self-pay | Admitting: Internal Medicine

## 2010-11-05 ENCOUNTER — Telehealth: Payer: Self-pay | Admitting: Internal Medicine

## 2010-11-17 DIAGNOSIS — G5750 Tarsal tunnel syndrome, unspecified lower limb: Secondary | ICD-10-CM

## 2010-11-18 ENCOUNTER — Encounter: Payer: Self-pay | Admitting: Internal Medicine

## 2010-11-19 ENCOUNTER — Encounter: Payer: Self-pay | Admitting: Internal Medicine

## 2010-12-02 NOTE — Assessment & Plan Note (Signed)
Vital Signs:  Patient Profile:   45 Years Old Female Weight:      231 pounds Pulse rate:   82 / minute BP sitting:   109 / 77                 History of Present Illness: The patient presents for a follow up of hypertension, diabetes, hyperlipidemia   Current Allergies: ! DEMEROL ! * NIASPAN ! * LOVASTATIN ! PAXIL ! * VYTORIN ! * METFORMIN  Past Medical History:    syncope, poss conversion disorder vs psychogenic seizures    Diabetes mellitus, type II    dyslipidemia    anxiety    Hypothyroidism    Low back pain Dr Noel Gerold    Peripheral neuropathy                    Gyn Dr Arelia Sneddon   Family History:    Family History Lung cance Fr  Social History:    Occupation:    Married    Current Smoker    Alcohol use-no    Drug use-no    Regular exercise-no   Risk Factors:  Tobacco use:  current Drug use:  no Alcohol use:  no Exercise:  no    Physical Exam  General:     Well-developed,well-nourished,in no acute distress; alert,appropriate and cooperative throughout examination Eyes:     No corneal or conjunctival inflammation noted. EOMI. Perrla. Funduscopic exam benign, without hemorrhages, exudates or papilledema. Vision grossly normal. Nose:     External nasal examination shows no deformity or inflammation. Nasal mucosa are pink and moist without lesions or exudates. Mouth:     Oral mucosa and oropharynx without lesions or exudates.  Teeth in good repair. Lungs:     Normal respiratory effort, chest expands symmetrically. Lungs are clear to auscultation, no crackles or wheezes. Heart:     Normal rate and regular rhythm. S1 and S2 normal without gallop, murmur, click, rub or other extra sounds. Abdomen:     Bowel sounds positive,abdomen soft and non-tender without masses, organomegaly or hernias noted. Msk:     No deformity or scoliosis noted of thoracic or lumbar spine.   Neurologic:     No cranial nerve deficits noted. Station and gait are normal.  Plantar reflexes are down-going bilaterally. DTRs are symmetrical throughout. Sensory, motor and coordinative functions appear intact. Skin:     Intact without suspicious lesions or rashes Psych:     Cognition and judgment appear intact. Alert and cooperative with normal attention span and concentration. No apparent delusions, illusions, hallucinations    Impression & Recommendations:  Problem # 1:  PERIPHERAL NEUROPATHY (ICD-356.9) Assessment: Unchanged Amitriptyline to try to try  Problem # 2:  LOW BACK PAIN (ICD-724.2) Assessment: Improved Rare Vicodin prn  Problem # 3:  DIABETES MELLITUS, TYPE II (ICD-250.00) Assessment: Unchanged  Her updated medication list for this problem includes:    Actos 45 Mg Tabs (Pioglitazone hcl) .Marland Kitchen... Take one tab by mouth once daily   Problem # 4:  HYPERLIPIDEMIA, OTHER UNSPECIFIED (ICD-272.4) Assessment: Unchanged  Her updated medication list for this problem includes:    Vytorin 10-40 Mg Tabs (Ezetimibe-simvastatin) .Marland Kitchen... Take one tab by mouth once daily   Problem # 5:  HYPOTHYROIDISM, UNSPECIFIED (ICD-244.9) Assessment: Unchanged  Her updated medication list for this problem includes:    Synthroid 75 Mcg Tabs (Levothyroxine sodium) .Marland Kitchen... Take one tab by mouth once daily   Complete Medication List: 1)  Proamatine  5 Mg Tabs (Midodrine hcl) .Marland Kitchen.. 1-2 bid 2)  Actos 45 Mg Tabs (Pioglitazone hcl) .... Take one tab by mouth once daily 3)  Synthroid 75 Mcg Tabs (Levothyroxine sodium) .... Take one tab by mouth once daily 4)  Asa 325  .... Take one tab by mouth once daily 5)  Flexeril  .... As needed 6)  Vit B Complex  .... Take one tab by mouth once daily 7)  Vytorin 10-40 Mg Tabs (Ezetimibe-simvastatin) .... Take one tab by mouth once daily 8)  Estradiol 1mg   .... Take one tab by mouth once daily 9)  Amaryl  .... Take one tab by mouth once daily 10)  Darvocet  .... Prn 11)  Flax Seed Oil  .... Take one tab by mouth once daily 12)   Amlodipine Besylate 10 Mg Tabs (Amlodipine besylate) .Marland Kitchen.. 1-2 once daily   Patient Instructions: 1)  Please schedule a follow-up appointment in 3 months with labs.    Prescriptions: AMLODIPINE BESYLATE 10 MG  TABS (AMLODIPINE BESYLATE) 1-2 once daily  #30 x 11   Entered and Authorized by:   Tresa Garter MD   Signed by:   Tresa Garter MD on 08/27/2007   Method used:   Print then Give to Patient   RxID:   [id number]  ]

## 2010-12-02 NOTE — Progress Notes (Signed)
Summary: Darvocet  Phone Note Call from Patient Call back at Home Phone [phone]   Caller: Pt Call For: Dr Posey Rea Summary of Call: Pt states Darvocet is the only thing that she can take for headaches. She states she does not take the two medications at the same time or even on the same day. Please advise. Initial call taken by: Verdell Face,  July 30, 2009 4:24 PM  Follow-up for Phone Call        OK Darvocet #30 Follow-up by: Tresa Garter MD,  July 31, 2009 12:16 AM  Additional Follow-up for Phone Call Additional follow up Details #1::        Pt informed  Additional Follow-up by: Lamar Sprinkles, CMA,  July 31, 2009 6:43 PM    New/Updated Medications: DARVOCET-N 100 100-650 MG TABS (PROPOXYPHENE N-APAP) 1 by mouth 4 times daily as neede for pain Prescriptions: DARVOCET-N 100 100-650 MG TABS (PROPOXYPHENE N-APAP) 1 by mouth 4 times daily as neede for pain  #30 x 0   Entered by:   Lamar Sprinkles, CMA   Authorized by:   Tresa Garter MD   Signed by:   Lamar Sprinkles, CMA on 07/31/2009   Method used:   [facility]ph[facility] to ...       [facility]  [address] [phone][facility] (retail)       [address]       [place], [place]  [address]       Ph: [phone] or [phone]       Fax: [phone]   RxID:   [id number]

## 2010-12-02 NOTE — Assessment & Plan Note (Signed)
Summary: PHYSICAL--STC   Vital Signs:  Patient profile:   45 year old female Weight:      223 pounds Temp:     97.8 degrees F oral Pulse rate:   114 / minute BP sitting:   120 / 80  (left arm)  Vitals Entered By: Tora Perches (October 02, 2009 3:10 PM) CC: cpx Is Patient Diabetic? Yes   CC:  cpx.  History of Present Illness: The patient presents for a wellness examination   Preventive Screening-Counseling & Management  Alcohol-Tobacco     Smoking Status: current  Current Medications (verified): 1)  Actos 45 Mg  Tabs (Pioglitazone Hcl) .... Take One Tab By Mouth Once Daily 2)  Synthroid 75 Mcg  Tabs (Levothyroxine Sodium) .... Take One Tab By Mouth Once Daily 3)  Glimepiride 1 Mg  Tabs (Glimepiride) .... 1/2 Po Bid 4)  Estrace 1 Mg  Tabs (Estradiol) .Marland Kitchen.. 1 By Mouth Qd 5)  Lamotrigine 25 Mg  Tabs (Lamotrigine) .... Two Times A Day 6)  Crestor 40 Mg Tabs (Rosuvastatin Calcium) .Marland Kitchen.. 1 Tablet By Mouth Daily 7)  Hydrocodone-Acetaminophen 5-325 Mg Tabs (Hydrocodone-Acetaminophen) .Marland Kitchen.. 1 By Mouth Up To 4 Times Per Day As Needed For Pain 8)  Vitamin B-12 Cr 1000 Mcg  Tbcr (Cyanocobalamin) .... Take One Tablet By Mouth Daily 9)  Vitamin D3 1000 Unit  Tabs (Cholecalciferol) .... 2 Qd 10)  Gabapentin 100 Mg Caps (Gabapentin) .Marland Kitchen.. 1 By Mouth Up To Qid As Needed Leg Pain and Tingling  Allergies: 1)  ! Demerol 2)  ! * Niaspan 3)  ! * Lovastatin 4)  ! Paxil 5)  ! * Metformin  Past History:  Past Surgical History: Last updated: 01/05/2009 C4-5 anterior cervical fusion Hysterectomy L CTS 2010  Family History: Last updated: 11/13/2007 Family History Lung cance Fr mother with alcoholism  Past Medical History: syncope, poss conversion disorder vs psychogenic seizures Diabetes mellitus, type II Hypothyroidism Low back pain Dr Noel Gerold Peripheral neuropathy Gyn Dr Arelia Sneddon COPD vit d deficient migraine Hyperlipidemia Anxiety Gout (?) Diet coke overuse -  tachycardia  Social History: Occupation: Married 3d GS due Nov Diet Coke: 10 cans a day Alcohol use-no Drug use-no Regular exercise-no Former Smoker 2009 restarted 2010  Review of Systems       The patient complains of weight loss and dyspnea on exertion.  The patient denies anorexia, fever, weight gain, vision loss, decreased hearing, hoarseness, chest pain, syncope, peripheral edema, prolonged cough, headaches, hemoptysis, abdominal pain, melena, hematochezia, severe indigestion/heartburn, hematuria, incontinence, genital sores, muscle weakness, suspicious skin lesions, transient blindness, difficulty walking, depression, unusual weight change, abnormal bleeding, enlarged lymph nodes, angioedema, and breast masses.    Physical Exam  General:  NAD overweight-appearing.   Head:  Normocephalic and atraumatic without obvious abnormalities. No apparent alopecia or balding. Eyes:  No corneal or conjunctival inflammation noted. EOMI. Perrla. Ears:  External ear exam shows no significant lesions or deformities.  Otoscopic examination reveals clear canals, tympanic membranes are intact bilaterally without bulging, retraction, inflammation or discharge. Hearing is grossly normal bilaterally. Nose:  External nasal examination shows no deformity or inflammation. Nasal mucosa are pink and moist without lesions or exudates. Mouth:  Oral mucosa and oropharynx without lesions or exudates.  Teeth in good repair. Neck:  No deformities, masses, or tenderness noted. Lungs:  Normal respiratory effort, chest expands symmetrically. Lungs are clear to auscultation, no crackles or wheezes. Heart:  regular rhythm, no gallop, and tachycardia.   Abdomen:  Bowel sounds positive,abdomen  soft and non-tender without masses, organomegaly or hernias noted. Msk:  Lumbar-sacral spine is tender to palpation over paraspinal muscles and painfull with the ROM  Stiff LS back Pulses:  WNL Extremities:  No edema Neurologic:   No cranial nerve deficits noted. Station and gait are normal. Plantar reflexes are down-going bilaterally. DTRs are symmetrical throughout. Sensory, motor and coordinative functions appear intact. Skin:  Clear Cervical Nodes:  No lymphadenopathy noted Inguinal Nodes:  No significant adenopathy Psych:  Cognition and judgment appear intact. Alert and cooperative with normal attention span and concentration. No apparent delusions, illusions, hallucinations   Impression & Recommendations:  Problem # 1:  PHYSICAL EXAMINATION (ICD-V70.0) Health and age related issues were discussed. Available screening tests and vaccinations were discussed as well. Healthy life style including good diet and execise was discussed.  The labs were reviewed with the patient.   Orders: EKG w/ Interpretation (93000)  Problem # 2:  TACHYCARDIA (ICD-785.0) Assessment: Unchanged Cut back on cokes  Problem # 3:  EDEMA (ICD-782.3) Assessment: Improved  Problem # 4:  DIABETES MELLITUS, TYPE II (ICD-250.00) Assessment: Comment Only  Her updated medication list for this problem includes:    Actos 45 Mg Tabs (Pioglitazone hcl) .Marland Kitchen... Take one tab by mouth once daily    Glimepiride 1 Mg Tabs (Glimepiride) .Marland Kitchen... 1/2 po bid  Problem # 5:  B12 DEFICIENCY (ICD-266.2) Assessment: Comment Only On prescription therapy   Complete Medication List: 1)  Actos 45 Mg Tabs (Pioglitazone hcl) .... Take one tab by mouth once daily 2)  Synthroid 75 Mcg Tabs (Levothyroxine sodium) .... Take one tab by mouth once daily 3)  Glimepiride 1 Mg Tabs (Glimepiride) .... 1/2 po bid 4)  Estrace 1 Mg Tabs (Estradiol) .Marland Kitchen.. 1 by mouth qd 5)  Lamotrigine 25 Mg Tabs (Lamotrigine) .... Two times a day 6)  Crestor 40 Mg Tabs (Rosuvastatin calcium) .Marland Kitchen.. 1 tablet by mouth daily 7)  Hydrocodone-acetaminophen 5-325 Mg Tabs (Hydrocodone-acetaminophen) .Marland Kitchen.. 1 by mouth up to 4 times per day as needed for pain 8)  Vitamin B-12 Cr 1000 Mcg Tbcr  (Cyanocobalamin) .... Take one tablet by mouth daily 9)  Vitamin D3 1000 Unit Tabs (Cholecalciferol) .... 2 qd 10)  Gabapentin 100 Mg Caps (Gabapentin) .Marland Kitchen.. 1 by mouth up to qid as needed leg pain and tingling 11)  Chantix Starting Month Pak 0.5 Mg X 11 & 1 Mg X 42 Tabs (Varenicline tartrate) .... As dirrected 12)  Chantix Continuing Month Pak 1 Mg Tabs (Varenicline tartrate) .... As dirrected  Contraindications/Deferment of Procedures/Staging:    Treatment: Flu Shot    Contraindication: N/A   Patient Instructions: 1)  Please schedule a follow-up appointment in 4 months. 2)  BMP prior to visit, ICD-9:250.00 3)  Hepatic Panel prior to visit, ICD-9: 4)  HbgA1C prior to visit, ICD-9: 5)  Try Melatonin Prescriptions: CHANTIX CONTINUING MONTH PAK 1 MG TABS (VARENICLINE TARTRATE) as dirrected  #1 x 5   Entered and Authorized by:   Tresa Garter MD   Signed by:   Tresa Garter MD on 10/02/2009   Method used:   Print then Give to Patient   RxID:   [id number] CHANTIX STARTING MONTH PAK 0.5 MG X 11 & 1 MG X 42 TABS (VARENICLINE TARTRATE) as dirrected  #1 x 0   Entered and Authorized by:   Tresa Garter MD   Signed by:   Tresa Garter MD on 10/02/2009   Method used:   Print then Give to  Patient   RxID:   [id number]

## 2010-12-02 NOTE — Miscellaneous (Signed)
Summary: hydrocodone-apap  Clinical Lists Changes  Medications: Rx of HYDROCODONE-ACETAMINOPHEN 5-325 MG TABS (HYDROCODONE-ACETAMINOPHEN) 1 by mouth up to 4 times per day as needed for pain;  #120 x 3;  Signed;  Entered by: Tora Perches;  Authorized by: Tresa Garter MD;  Method used: [facility]  [address] [phone]*, [address] [place], [place], [place]  [address], Ph: [phone] or [phone], Fax: [phone]    Prescriptions: HYDROCODONE-ACETAMINOPHEN 5-325 MG TABS (HYDROCODONE-ACETAMINOPHEN) 1 by mouth up to 4 times per day as needed for pain  #120 x 3   Entered by:   Tora Perches   Authorized by:   Tresa Garter MD   Signed by:   Tora Perches on 04/16/2009   Method used:   Telephoned to ...       [facility]  [address] [phone]* (retail)       [address] [place], [place]  [address]       Ph: [phone] or [phone]       Fax: [phone]   RxID:   [id number]

## 2010-12-02 NOTE — Progress Notes (Signed)
Summary: Rf Hydroco-Acetamin  Phone Note Refill Request Message from:  Fax from Pharmacy  Refills Requested: Medication #1:  HYDROCODONE-ACETAMINOPHEN 10-325 MG TABS 1 by mouth qid as needed pain   Dosage confirmed as above?Dosage Confirmed   Supply Requested: 120   Last Refilled: 07/02/2010  Method Requested: Telephone to Pharmacy Next Appointment Scheduled: 10-18-10 Initial call taken by: Lanier Prude, [facility]),  August 05, 2010 10:33 AM  Follow-up for Phone Call        ok to ref Follow-up by: Tresa Garter MD,  August 05, 2010 12:45 PM  Additional Follow-up for Phone Call Additional follow up Details #1::        Rx called to pharmacy Additional Follow-up by: Lanier Prude, [facility]),  August 05, 2010 4:50 PM    Prescriptions: HYDROCODONE-ACETAMINOPHEN 10-325 MG TABS (HYDROCODONE-ACETAMINOPHEN) 1 by mouth qid as needed pain  #120 x 0   Entered by:   Lanier Prude, [facility])   Authorized by:   Tresa Garter MD   Signed by:   Lanier Prude, CMA(AAMA) on 08/05/2010   Method used:   Telephoned to ...       [facility]  [address] [phone]* (retail)       [address] [place], [place]  [address]       Ph: [phone] or [phone]       Fax: [phone]   RxID:   [id number]

## 2010-12-02 NOTE — Progress Notes (Signed)
----   Converted from flag ---- ---- 05/04/2009 12:19 PM, Tresa Garter MD wrote: [facility] fax 05/01/09 docs to dr Bennett Scrape [facility] ?name ------------------------------  faxed to both timothy vogler in [place] and [place]/vg

## 2010-12-02 NOTE — Letter (Signed)
Summary: [facility]   Imported By: Lester  08/11/2009 10:26:57  _____________________________________________________________________  External Attachment:    Type:   Image     Comment:   External Document

## 2010-12-02 NOTE — Assessment & Plan Note (Signed)
Summary: 3 MO FU/$50/PN   Vital Signs:  Patient Profile:   45 Years Old Female Weight:      230 pounds Temp:     97 degrees F oral Pulse rate:   88 / minute BP sitting:   108 / 70  (left arm)  Vitals Entered By: Tora Perches (July 28, 2008 3:06 PM)                 Chief Complaint:  Multiple medical problems or concerns.  History of Present Illness: The patient presents for a follow up of hypertension, diabetes, hyperlipidemia. Daughter Ala Dach had a swine flu 3 wks ago. C/o L elbow pain x 2 wks     Current Allergies (reviewed today): ! DEMEROL ! * NIASPAN ! * LOVASTATIN ! PAXIL ! * METFORMIN  Past Medical History:    Reviewed history from 11/13/2007 and no changes required:       syncope, poss conversion disorder vs psychogenic seizures       Diabetes mellitus, type II       Hypothyroidism       Low back pain Dr Noel Gerold       Peripheral neuropathy       Gyn Dr Arelia Sneddon       COPD       vit d deficient       migraine       Hyperlipidemia       Anxiety   Family History:    Reviewed history from 11/13/2007 and no changes required:       Family History Lung cance Fr       mother with alcoholism  Social History:    Reviewed history from 08/20/2007 and no changes required:       Occupation:       Married 3d GS due Nov              Alcohol use-no       Drug use-no       Regular exercise-no       Former Smoker 2009   Risk Factors:  Tobacco use:  quit   Review of Systems  The patient denies anorexia, decreased hearing, peripheral edema, and abdominal pain.     Physical Exam  General:     NAD Eyes:     No corneal or conjunctival inflammation noted. EOMI. Perrla. Funduscopic exam benign, without hemorrhages, exudates or papilledema. Vision grossly normal. Nose:     External nasal examination shows no deformity or inflammation. Nasal mucosa are pink and moist without lesions or exudates. Mouth:     Oral mucosa and oropharynx without lesions or  exudates.  Teeth in good repair. Neck:     No deformities, masses, or tenderness noted. Lungs:     Normal respiratory effort, chest expands symmetrically. Lungs are clear to auscultation, no crackles or wheezes. Heart:     Normal rate and regular rhythm. S1 and S2 normal without gallop, murmur, click, rub or other extra sounds. Abdomen:     Bowel sounds positive,abdomen soft and non-tender without masses, organomegaly or hernias noted. Msk:     No deformity or scoliosis noted of thoracic or lumbar spine.  L lat epicond. is painful Extremities:     No clubbing, cyanosis, edema, or deformity noted with normal full range of motion of all joints.   Neurologic:     No cranial nerve deficits noted. Station and gait are normal. Plantar reflexes are down-going bilaterally. DTRs are symmetrical throughout.  Sensory, motor and coordinative functions appear intact. Skin:     Intact without suspicious lesions or rashes Psych:     Cognition and judgment appear intact. Alert and cooperative with normal attention span and concentration. No apparent delusions, illusions, hallucinations    Impression & Recommendations:  Problem # 1:  DIABETES MELLITUS, TYPE II (ICD-250.00) Assessment: Unchanged  Her updated medication list for this problem includes:    Actos 45 Mg Tabs (Pioglitazone hcl) .Marland Kitchen... Take one tab by mouth once daily    Glimepiride 1 Mg Tabs (Glimepiride) .Marland Kitchen... 1po q am   Problem # 2:  HYPOTHYROIDISM, UNSPECIFIED (ICD-244.9) Assessment: Unchanged  Her updated medication list for this problem includes:    Synthroid 75 Mcg Tabs (Levothyroxine sodium) .Marland Kitchen... Take one tab by mouth once daily   Problem # 3:  ELBOW PAIN (ICD-719.42) L, lat epicon-itis Assessment: New Mobic Ice Will inject if not better  Problem # 4:  HYPERLIPIDEMIA, OTHER UNSPECIFIED (ICD-272.4) Assessment: Deteriorated  The following medications were removed from the medication list:    Vytorin 10-40 Mg Tabs  (Ezetimibe-simvastatin) .Marland Kitchen... 1 by mouth qd  Her updated medication list for this problem includes:    Crestor 40 Mg Tabs (Rosuvastatin calcium) .Marland Kitchen... 1 tablet by mouth daily will try  The following medications were removed from the medication list:    Vytorin 10-40 Mg Tabs (Ezetimibe-simvastatin) .Marland Kitchen... 1 by mouth qd  Her updated medication list for this problem includes:    Crestor 40 Mg Tabs (Rosuvastatin calcium) .Marland Kitchen... 1 tablet by mouth daily   Problem # 5:  PARESTHESIA (ICD-782.0) Incr. Amitript. to 50-100 mg q hs   Problem # 6:  TOBACCO USE DISORDER/SMOKER-SMOKING CESSATION DISCUSSED (ICD-305.1) Assessment: Comment Only Stopped smoking! Her updated medication list for this problem includes:    Chantix Starting Month Pak 0.5 Mg X 11 & 1 Mg X 42 Misc (Varenicline tartrate) .Marland Kitchen... 0.5mg  by mouth once daily for 3 days, then twice daily for 4 days and then 1mg  by mouth 2 times daily    Chantix 1 Mg Tabs (Varenicline tartrate) .Marland Kitchen... 1 tab by mouth 2 times daily   Complete Medication List: 1)  Actos 45 Mg Tabs (Pioglitazone hcl) .... Take one tab by mouth once daily 2)  Synthroid 75 Mcg Tabs (Levothyroxine sodium) .... Take one tab by mouth once daily 3)  Glimepiride 1 Mg Tabs (Glimepiride) .Marland Kitchen.. 1po q am 4)  Estrace 1 Mg Tabs (Estradiol) .Marland Kitchen.. 1 by mouth qd 5)  Vitamin D3 1000 Unit Tabs (Cholecalciferol) .... 2 qd 6)  Lamotrigine 25 Mg Tabs (Lamotrigine) .... Two times a day 7)  Zolpidem Tartrate 10 Mg Tabs (Zolpidem tartrate) .... 1/2 or 1 by mouth at hs prn 8)  Chantix Starting Month Pak 0.5 Mg X 11 & 1 Mg X 42 Misc (Varenicline tartrate) .... 0.5mg  by mouth once daily for 3 days, then twice daily for 4 days and then 1mg  by mouth 2 times daily 9)  Chantix 1 Mg Tabs (Varenicline tartrate) .Marland Kitchen.. 1 tab by mouth 2 times daily 10)  Darvocet-n 100 100-650 Mg Tabs (Propoxyphene n-apap) .Marland Kitchen.. 1 by mouth two times a day as needed pain 11)  Meloxicam 15 Mg Tabs (Meloxicam) .... One by mouth daily  x 2 wks, then prn 12)  Crestor 40 Mg Tabs (Rosuvastatin calcium) .Marland Kitchen.. 1 tablet by mouth daily 13)  Amitriptyline Hcl 50 Mg Tabs (Amitriptyline hcl) .... Take 1-2  tab by mouth at bedtime   Patient Instructions: 1)  Ice 2)  Please schedule a follow-up appointment in 3 months. 3)  BMP prior to visit, ICD-9: 4)  Hepatic Panel prior to visit, ICD-9: 5)  Lipid Panel prior to visit, ICD-9: 250.00 6)  HbgA1C prior to visit, ICD-9:   Prescriptions: AMITRIPTYLINE HCL 50 MG TABS (AMITRIPTYLINE HCL) Take 1-2  tab by mouth at bedtime  #60 x 12   Entered and Authorized by:   Tresa Garter MD   Signed by:   Tresa Garter MD on 07/28/2008   Method used:   Print then Give to Patient   RxID:   [id number] CRESTOR 40 MG TABS (ROSUVASTATIN CALCIUM) 1 tablet by mouth daily  #90 x 3   Entered and Authorized by:   Tresa Garter MD   Signed by:   Tresa Garter MD on 07/28/2008   Method used:   Print then Give to Patient   RxID:   [id number] MELOXICAM 15 MG TABS (MELOXICAM) one by mouth daily x 2 wks, then prn  #30 x 2   Entered and Authorized by:   Tresa Garter MD   Signed by:   Tresa Garter MD on 07/28/2008   Method used:   Print then Give to Patient   RxID:   [id number]  ]

## 2010-12-02 NOTE — Letter (Signed)
Summary: [facility]   Imported By: Sherian Rein 04/07/2009 11:02:55  _____________________________________________________________________  External Attachment:    Type:   Image     Comment:   External Document

## 2010-12-02 NOTE — Progress Notes (Signed)
Summary: hydrocodone  Phone Note Other Incoming Call back at fax   Caller: [facility] Details for Reason: refill on hydrocodone Details of Action Taken: ok x 5 refills will fax back o pharmacy/vg   Initial call taken by: Tora Perches,  December 28, 2009 1:14 PM    Prescriptions: HYDROCODONE-ACETAMINOPHEN 10-325 MG TABS (HYDROCODONE-ACETAMINOPHEN) 1 by mouth qid as needed pain  #120 x 5   Entered by:   Tora Perches   Authorized by:   Tresa Garter MD   Signed by:   Tora Perches on 12/28/2009   Method used:   [facility]phoned to ...       [facility]  [address] [phone]* (retail)       [address] [place], [place]  [address]       Ph: [phone] or [phone]       Fax: [phone]   RxID:   [id number]

## 2010-12-02 NOTE — Assessment & Plan Note (Signed)
Summary: ARM IS SWELLING /NWS $50   Vital Signs:  Patient Profile:   45 Years Old Female Weight:      252 pounds Temp:     97.0 degrees F oral Pulse rate:   84 / minute Pulse rhythm:   regular BP sitting:   120 / 74  (left arm) Cuff size:   large  Vitals Entered By: Rock Nephew CMA (November 03, 2008 4:33 PM)                 Chief Complaint:  r ARM PAIN AND SWELLING X 1 WK.  History of Present Illness: C/o R arm swelling x 6 d - throbbing    Current Allergies: ! DEMEROL ! * NIASPAN ! * LOVASTATIN ! PAXIL ! * METFORMIN  Past Medical History:    Reviewed history from 07/28/2008 and no changes required:       syncope, poss conversion disorder vs psychogenic seizures       Diabetes mellitus, type II       Hypothyroidism       Low back pain Dr Noel Gerold       Peripheral neuropathy       Gyn Dr Arelia Sneddon       COPD       vit d deficient       migraine       Hyperlipidemia       Anxiety   Family History:    Reviewed history from 11/13/2007 and no changes required:       Family History Lung cance Fr       mother with alcoholism  Social History:    Reviewed history from 07/28/2008 and no changes required:       Occupation:       Married 3d GS due Nov              Alcohol use-no       Drug use-no       Regular exercise-no       Former Smoker 2009    Review of Systems  The patient denies fever, syncope, dyspnea on exertion, and chest pain.     Physical Exam  General:     NAD Head:     Normocephalic and atraumatic without obvious abnormalities. No apparent alopecia or balding. Nose:     External nasal examination shows no deformity or inflammation. Nasal mucosa are pink and moist without lesions or exudates. Mouth:     Oral mucosa and oropharynx without lesions or exudates.  Teeth in good repair. Neck:     No deformities, masses, or tenderness noted. Lungs:     Normal respiratory effort, chest expands symmetrically. Lungs are clear to auscultation,  no crackles or wheezes. Heart:     Normal rate and regular rhythm. S1 and S2 normal without gallop, murmur, click, rub or other extra sounds. Abdomen:     Bowel sounds positive,abdomen soft and non-tender without masses, organomegaly or hernias noted. Msk:     R arm and forearm are slightely puffy and blueish over palmar aspects, NT Pulses:     R and L carotid,radial,femoral,dorsalis pedis and posterior tibial pulses are full and equal bilaterally Extremities:     No clubbing, cyanosis, edema, or deformity noted with normal full range of motion of all joints.   Neurologic:     No cranial nerve deficits noted. Station and gait are normal. Plantar reflexes are down-going bilaterally. DTRs are symmetrical throughout. Sensory, motor and coordinative  functions appear intact. Skin:     Intact without suspicious lesions or rashes Psych:     Cognition and judgment appear intact. Alert and cooperative with normal attention span and concentration. No apparent delusions, illusions, hallucinations    Impression & Recommendations:  Problem # 1:  ELBOW PAIN (ICD-719.42)/ R arm and forearm pain - likely a hematoma Assessment: New R/o DVT- get an [place] Elev/rest Vicodin prn Orders: Doppler Referral (Doppler) >20 min  Problem # 2:  ANXIETY (ICD-300.00) Assessment: Unchanged  Her updated medication list for this problem includes:    Amitriptyline Hcl 50 Mg Tabs (Amitriptyline hcl) .Marland Kitchen... Take 1-2  tab by mouth at bedtime   Complete Medication List: 1)  Actos 45 Mg Tabs (Pioglitazone hcl) .... Take one tab by mouth once daily 2)  Synthroid 75 Mcg Tabs (Levothyroxine sodium) .... Take one tab by mouth once daily 3)  Glimepiride 1 Mg Tabs (Glimepiride) .Marland Kitchen.. 1po q am 4)  Estrace 1 Mg Tabs (Estradiol) .Marland Kitchen.. 1 by mouth qd 5)  Vitamin D3 1000 Unit Tabs (Cholecalciferol) .... 2 qd 6)  Lamotrigine 25 Mg Tabs (Lamotrigine) .... Two times a day 7)  Zolpidem Tartrate 10 Mg Tabs (Zolpidem tartrate) .... 1/2  or 1 by mouth at hs prn 8)  Darvocet-n 100 100-650 Mg Tabs (Propoxyphene n-apap) .Marland Kitchen.. 1 by mouth two times a day as needed pain 9)  Meloxicam 15 Mg Tabs (Meloxicam) .... One by mouth daily x 2 wks, then prn 10)  Crestor 40 Mg Tabs (Rosuvastatin calcium) .Marland Kitchen.. 1 tablet by mouth daily 11)  Amitriptyline Hcl 50 Mg Tabs (Amitriptyline hcl) .... Take 1-2  tab by mouth at bedtime 12)  Ibuprofen 600 Mg Tabs (Ibuprofen) .Marland Kitchen.. 1 by mouth two times a day pc x 3 wks, then prn 13)  Hydrocodone-acetaminophen 5-325 Mg Tabs (Hydrocodone-acetaminophen) .Marland Kitchen.. 1 by mouth up to 4 times per day as needed for pain   Patient Instructions: 1)  Heat, elevate on a pillow 2)  Call if you are not better in a reasonable ammount of time or if worse. Go to ER if feeling really bad! 3)  Please schedule a follow-up appointment in 2 weeks.   Prescriptions: HYDROCODONE-ACETAMINOPHEN 5-325 MG TABS (HYDROCODONE-ACETAMINOPHEN) 1 by mouth up to 4 times per day as needed for pain  #60 x 1   Entered and Authorized by:   Tresa Garter MD   Signed by:   Tresa Garter MD on 11/03/2008   Method used:   Print then Give to Patient   RxID:   [id number]  ]

## 2010-12-02 NOTE — Letter (Signed)
Summary: [facility]   Imported By: Sherian Rein 01/19/2010 12:19:44  _____________________________________________________________________  External Attachment:    Type:   Image     Comment:   External Document

## 2010-12-02 NOTE — Progress Notes (Signed)
Summary: Diabetic testing  Phone Note Other Incoming   [facility] [phone] Summary of Call: [facility] medical called to find out how many times per day the patient checks her blood sugar. I spoke with the patient and she checks it 2x per day.  Notified [employer] and they will over MD order form. Initial call taken by: Lucious Groves,  December 16, 2009 4:21 PM

## 2010-12-02 NOTE — Assessment & Plan Note (Signed)
Summary: 3 mo fu/$50/pn   Vital Signs:  Patient Profile:   45 Years Old Female Weight:      247 pounds Temp:     97.2 degrees F oral Pulse rate:   108 / minute BP sitting:   110 / 80  (left arm)  Vitals Entered By: Tora Perches (October 06, 2008 2:30 PM)                 Chief Complaint:  Multiple medical problems or concerns.  History of Present Illness: The patient presents for a follow up of hypertension, diabetes, hyperlipidemia     Current Allergies (reviewed today): ! DEMEROL ! * NIASPAN ! * LOVASTATIN ! PAXIL ! * METFORMIN  Past Medical History:    Reviewed history from 07/28/2008 and no changes required:       syncope, poss conversion disorder vs psychogenic seizures       Diabetes mellitus, type II       Hypothyroidism       Low back pain Dr Noel Gerold       Peripheral neuropathy       Gyn Dr Arelia Sneddon       COPD       vit d deficient       migraine       Hyperlipidemia       Anxiety   Family History:    Reviewed history from 11/13/2007 and no changes required:       Family History Lung cance Fr       mother with alcoholism  Social History:    Reviewed history from 07/28/2008 and no changes required:       Occupation:       Married 3d GS due Nov              Alcohol use-no       Drug use-no       Regular exercise-no       Former Smoker 2009    Review of Systems  The patient denies fever, chest pain, dyspnea on exertion, abdominal pain, and hematochezia.     Physical Exam  General:     NAD Head:     Normocephalic and atraumatic without obvious abnormalities. No apparent alopecia or balding. Ears:     External ear exam shows no significant lesions or deformities.  Otoscopic examination reveals clear canals, tympanic membranes are intact bilaterally without bulging, retraction, inflammation or discharge. Hearing is grossly normal bilaterally. Nose:     External nasal examination shows no deformity or inflammation. Nasal mucosa are pink and  moist without lesions or exudates. Mouth:     Oral mucosa and oropharynx without lesions or exudates.  Teeth in good repair. Neck:     No deformities, masses, or tenderness noted. Lungs:     Normal respiratory effort, chest expands symmetrically. Lungs are clear to auscultation, no crackles or wheezes. Heart:     Normal rate and regular rhythm. S1 and S2 normal without gallop, murmur, click, rub or other extra sounds. Abdomen:     Bowel sounds positive,abdomen soft and non-tender without masses, organomegaly or hernias noted. Msk:     No deformity or scoliosis noted of thoracic or lumbar spine.  CTS signs are (+) B Pulses:     R and L carotid,radial,femoral,dorsalis pedis and posterior tibial pulses are full and equal bilaterally Extremities:     No clubbing, cyanosis, edema, or deformity noted with normal full range of motion of all joints.  Neurologic:     No cranial nerve deficits noted. Station and gait are normal. Plantar reflexes are down-going bilaterally. DTRs are symmetrical throughout. Sensory, motor and coordinative functions appear intact. Skin:     Intact without suspicious lesions or rashes Psych:     Cognition and judgment appear intact. Alert and cooperative with normal attention span and concentration. No apparent delusions, illusions, hallucinations    Impression & Recommendations:  Problem # 1:  HYPERLIPIDEMIA (ICD-272.4) Assessment: Improved  Her updated medication list for this problem includes:    Crestor 40 Mg Tabs (Rosuvastatin calcium) .Marland Kitchen... 1 tablet by mouth daily   Problem # 2:  PARESTHESIA (ICD-782.0) B hands, worse - poss CTS Assessment: Deteriorated Ortho cons Orders: Orthopedic Referral (Ortho)   Problem # 3:  HYPOTHYROIDISM, UNSPECIFIED (ICD-244.9) Assessment: Comment Only  Her updated medication list for this problem includes:    Synthroid 75 Mcg Tabs (Levothyroxine sodium) .Marland Kitchen... Take one tab by mouth once daily   Problem # 4:   DIABETES MELLITUS, TYPE II (ICD-250.00) Assessment: Comment Only Loose wt! Her updated medication list for this problem includes:    Actos 45 Mg Tabs (Pioglitazone hcl) .Marland Kitchen... Take one tab by mouth once daily    Glimepiride 1 Mg Tabs (Glimepiride) .Marland Kitchen... 1po q am   Complete Medication List: 1)  Actos 45 Mg Tabs (Pioglitazone hcl) .... Take one tab by mouth once daily 2)  Synthroid 75 Mcg Tabs (Levothyroxine sodium) .... Take one tab by mouth once daily 3)  Glimepiride 1 Mg Tabs (Glimepiride) .Marland Kitchen.. 1po q am 4)  Estrace 1 Mg Tabs (Estradiol) .Marland Kitchen.. 1 by mouth qd 5)  Vitamin D3 1000 Unit Tabs (Cholecalciferol) .... 2 qd 6)  Lamotrigine 25 Mg Tabs (Lamotrigine) .... Two times a day 7)  Zolpidem Tartrate 10 Mg Tabs (Zolpidem tartrate) .... 1/2 or 1 by mouth at hs prn 8)  Darvocet-n 100 100-650 Mg Tabs (Propoxyphene n-apap) .Marland Kitchen.. 1 by mouth two times a day as needed pain 9)  Meloxicam 15 Mg Tabs (Meloxicam) .... One by mouth daily x 2 wks, then prn 10)  Crestor 40 Mg Tabs (Rosuvastatin calcium) .Marland Kitchen.. 1 tablet by mouth daily 11)  Amitriptyline Hcl 50 Mg Tabs (Amitriptyline hcl) .... Take 1-2  tab by mouth at bedtime 12)  Ibuprofen 600 Mg Tabs (Ibuprofen) .Marland Kitchen.. 1 by mouth two times a day pc x 3 wks, then prn   Patient Instructions: 1)  Please schedule a follow-up appointment in 3 months. 2)  BMP prior to visit, ICD-9: 3)  Hepatic Panel prior to visit, ICD-9: 250.00 4)  Lipid Panel prior to visit, ICD-9: 5)  TSH prior to visit, ICD-9: 6)  HbgA1C prior to visit, ICD-9: 7)  Vit B12 782.0   Prescriptions: IBUPROFEN 600 MG  TABS (IBUPROFEN) 1 by mouth two times a day pc x 3 wks, then prn  #60 x 6   Entered and Authorized by:   Tresa Garter MD   Signed by:   Tresa Garter MD on 10/06/2008   Method used:   Print then Give to Patient   RxID:   [id number] AMITRIPTYLINE HCL 50 MG TABS (AMITRIPTYLINE HCL) Take 1-2  tab by mouth at bedtime  #60 x 6   Entered and Authorized by:    Tresa Garter MD   Signed by:   Tresa Garter MD on 10/06/2008   Method used:   Print then Give to Patient   RxID:   [id number] DARVOCET-N 100 100-650 MG TABS (  PROPOXYPHENE N-APAP) 1 by mouth two times a day as needed pain  #120 x 3   Entered and Authorized by:   Tresa Garter MD   Signed by:   Tresa Garter MD on 10/06/2008   Method used:   Print then Give to Patient   RxID:   [id number]  ]

## 2010-12-02 NOTE — Letter (Signed)
Summary: [facility]   Imported By: Sherian Rein 05/05/2010 11:13:50  _____________________________________________________________________  External Attachment:    Type:   Image     Comment:   External Document

## 2010-12-02 NOTE — Progress Notes (Signed)
Summary: Rf Hydroco/APAP  Phone Note Refill Request Message from:  Fax from Pharmacy  Refills Requested: Medication #1:  HYDROCODONE-ACETAMINOPHEN 10-325 MG TABS 1 by mouth qid as needed pain   Dosage confirmed as above?Dosage Confirmed   Supply Requested: 120   Last Refilled: 08/05/2010  Method Requested: Telephone to Pharmacy Next Appointment Scheduled: 10-18-10 Initial call taken by: Lanier Prude, [facility]),  September 30, 2010 12:02 PM  Follow-up for Phone Call        ok to ref Follow-up by: Tresa Garter MD,  September 30, 2010 1:02 PM  Additional Follow-up for Phone Call Additional follow up Details #1::        Rx called to pharmacy Additional Follow-up by: Lanier Prude, [facility]),  September 30, 2010 2:27 PM    Prescriptions: HYDROCODONE-ACETAMINOPHEN 10-325 MG TABS (HYDROCODONE-ACETAMINOPHEN) 1 by mouth qid as needed pain  #120 x 0   Entered by:   Lanier Prude, [facility])   Authorized by:   Tresa Garter MD   Signed by:   Lanier Prude, CMA(AAMA) on 09/30/2010   Method used:   [facility]ph[facility] ...       [facility]  [address] [phone] ([facility]ail[facility]       [address] [place], [place]  [address]       Ph: [phone] or [phone]       Fax: [phone]   RxID:   [id number]

## 2010-12-02 NOTE — Progress Notes (Signed)
Summary: Propoxyphen-apap  Phone Note From Pharmacy   [facility] [phone] Reason for Call: Needs renewal Summary of Call: Requesting renewal on propoxyphen-apap 100-650 mg # 100 take 1 qid as needed for pain. Last filled on 02/15/08. This medication is not on med list. Is this OK Initial call taken by: Orlan Leavens,  May 16, 2008 9:08 AM  Follow-up for Phone Call        OK Follow-up by: Tresa Garter MD,  May 16, 2008 1:04 PM    New/Updated Medications: DARVOCET-N 100 100-650 MG TABS (PROPOXYPHENE N-APAP) 1 by mouth two times a day as needed pain   Prescriptions: DARVOCET-N 100 100-650 MG TABS (PROPOXYPHENE N-APAP) 1 by mouth two times a day as needed pain  #60 x 2   Entered by:   Lamar Sprinkles   Authorized by:   Tresa Garter MD   Signed by:   Lamar Sprinkles on 05/16/2008   Method used:   Faxed to ...       [facility] [phone][facility]       [address]       [place], [place]  [address]       Ph: [phone] or [phone]       Fax: [phone]   RxID:   [id number]

## 2010-12-02 NOTE — Assessment & Plan Note (Signed)
Summary: CHEST CONGESTION/COLD CHILLS/NML   Vital Signs:  Patient Profile:   45 Years Old Female Weight:      233 pounds Temp:     97.5 degrees F oral Pulse rate:   84 / minute BP sitting:   110 / 73  (right arm) Cuff size:   regular  Pt. in pain?   no  Vitals Entered By: Maris Berger (November 13, 2007 3:07 PM)                  Chief Complaint:  congestion.  History of Present Illness: here with acute onset facial pain, pressure, fever and greenish d/c for several days  Current Allergies (reviewed today): ! DEMEROL ! * NIASPAN ! * LOVASTATIN ! PAXIL ! * METFORMIN Updated/Current Medications (including changes made in today's visit):  ACTOS 45 MG  TABS (PIOGLITAZONE HCL) Take one tab by mouth once daily SYNTHROID 75 MCG  TABS (LEVOTHYROXINE SODIUM) Take one tab by mouth once daily * VIT B COMPLEX Take one tab by mouth once daily VYTORIN 10-40 MG  TABS (EZETIMIBE-SIMVASTATIN) Take one tab by mouth once daily * ESTRADIOL 1MG  Take one tab by mouth once daily GLIMEPIRIDE 1 MG  TABS (GLIMEPIRIDE) 1po q am PROPOXYPHENE N-APAP 100-650 MG  TABS (PROPOXYPHENE N-APAP) qid as needed VICODIN 5-500 MG  TABS (HYDROCODONE-ACETAMINOPHEN) 1 by mouth once daily prn AMITRIPTYLINE HCL 25 MG  TABS (AMITRIPTYLINE HCL) 1 by mouth qhs CEFTIN 250 MG  TABS (CEFUROXIME AXETIL) 1 by mouth bid PROMETHAZINE-CODEINE 6.25-10 MG/5ML  SYRP (PROMETHAZINE-CODEINE) 1 tsp by mouth qid prn   Past Medical History:    Reviewed history from 08/20/2007 and no changes required:       syncope, poss conversion disorder vs psychogenic seizures       Diabetes mellitus, type II       dyslipidemia       anxiety       Hypothyroidism       Low back pain Dr Noel Gerold       Peripheral neuropathy       Gyn Dr Arelia Sneddon       COPD       vit d deficient       migraine  Past Surgical History:    Reviewed history from 05/22/2007 and no changes required:       C4-5 anterior cervical fusion        Hysterectomy   Family History:    Family History Lung cance Fr    mother with alcoholism     Physical Exam  General:     mild ill Head:     Normocephalic and atraumatic without obvious abnormalities. No apparent alopecia or balding. Eyes:     No corneal or conjunctival inflammation noted. EOMI. Perrla.  Ears:     bilat tm's red, sinus tender bilat Nose:     nasal dischargemucosal pallor.   Mouth:     pharyngeal erythema.   Neck:     cervical lymphadenopathy.   Lungs:     Normal respiratory effort, chest expands symmetrically. Lungs are clear to auscultation, no crackles or wheezes. Heart:     Normal rate and regular rhythm. S1 and S2 normal without gallop, murmur, click, rub or other extra sounds. Extremities:     no edema    Impression & Recommendations:  Problem # 1:  SINUSITIS- ACUTE-NOS (ICD-461.9)  Her updated medication list for this problem includes:    Ceftin 250 Mg Tabs (Cefuroxime axetil) .Marland KitchenMarland KitchenMarland KitchenMarland Kitchen 1  by mouth bid    Promethazine-codeine 6.25-10 Mg/23ml Syrp (Promethazine-codeine) .Marland Kitchen... 1 tsp by mouth qid prn  tx as above  Problem # 2:  DIABETES MELLITUS, TYPE II (ICD-250.00)  Her updated medication list for this problem includes:    Actos 45 Mg Tabs (Pioglitazone hcl) .Marland Kitchen... Take one tab by mouth once daily    Glimepiride 1 Mg Tabs (Glimepiride) .Marland Kitchen... 1po q am cont meds, check cbg's - call for > 200  Complete Medication List: 1)  Actos 45 Mg Tabs (Pioglitazone hcl) .... Take one tab by mouth once daily 2)  Synthroid 75 Mcg Tabs (Levothyroxine sodium) .... Take one tab by mouth once daily 3)  Vit B Complex  .... Take one tab by mouth once daily 4)  Vytorin 10-40 Mg Tabs (Ezetimibe-simvastatin) .... Take one tab by mouth once daily 5)  Estradiol 1mg   .... Take one tab by mouth once daily 6)  Glimepiride 1 Mg Tabs (Glimepiride) .Marland Kitchen.. 1po q am 7)  Propoxyphene N-apap 100-650 Mg Tabs (Propoxyphene n-apap) .... Qid as needed 8)  Vicodin 5-500 Mg Tabs  (Hydrocodone-acetaminophen) .Marland Kitchen.. 1 by mouth once daily prn 9)  Amitriptyline Hcl 25 Mg Tabs (Amitriptyline hcl) .Marland Kitchen.. 1 by mouth qhs 10)  Ceftin 250 Mg Tabs (Cefuroxime axetil) .Marland Kitchen.. 1 by mouth bid 11)  Promethazine-codeine 6.25-10 Mg/10ml Syrp (Promethazine-codeine) .Marland Kitchen.. 1 tsp by mouth qid prn   Patient Instructions: 1)  Take all medications as prescribed 2)  Please schedule an appointment with your primary doctor as needed    Prescriptions: PROMETHAZINE-CODEINE 6.25-10 MG/5ML  SYRP (PROMETHAZINE-CODEINE) 1 tsp by mouth qid prn  #8 oz x 1   Entered and Authorized by:   Corwin Levins MD   Signed by:   Corwin Levins MD on 11/13/2007   Method used:   Print then Give to Patient   RxID:   [id number] CEFTIN 250 MG  TABS (CEFUROXIME AXETIL) 1 by mouth bid  #20 x 0   Entered and Authorized by:   Corwin Levins MD   Signed by:   Corwin Levins MD on 11/13/2007   Method used:   Print then Give to Patient   RxID:   [id number] AMITRIPTYLINE HCL 25 MG  TABS (AMITRIPTYLINE HCL) 1 by mouth qhs  #30 x 11   Entered and Authorized by:   Corwin Levins MD   Signed by:   Corwin Levins MD on 11/13/2007   Method used:   Print then Give to Patient   RxID:   [id number] VICODIN 5-500 MG  TABS (HYDROCODONE-ACETAMINOPHEN) 1 by mouth once daily prn  #50 x 2   Entered and Authorized by:   Corwin Levins MD   Signed by:   Corwin Levins MD on 11/13/2007   Method used:   Print then Give to Patient   RxID:   [id number]  ]

## 2010-12-02 NOTE — Letter (Signed)
[facility]: [facility]   Imported By: Lester Hermosa 02/19/2009 10:18:12  _____________________________________________________________________  External Attachment:    Type:   Image     Comment:   External Document

## 2010-12-02 NOTE — Miscellaneous (Signed)
Summary: Orders Update  Clinical Lists Changes  Orders: Added new Service order of [place] NS50 (NS50) - Signed 

## 2010-12-02 NOTE — Miscellaneous (Signed)
Summary: propoxyphen-apap  Clinical Lists Changes  Medications: Changed medication from * DARVOCET prn to PROPOXYPHENE N-APAP 100-650 MG  TABS (PROPOXYPHENE N-APAP) qid as needed - Signed Rx of PROPOXYPHENE N-APAP 100-650 MG  TABS (PROPOXYPHENE N-APAP) qid as needed;  #100 x 2;  Signed;  Entered by: Tora Perches;  Authorized by: Tresa Garter MD;  Method used: [facility]oned to [facility] [phone]*, [address] [place], [place], [place]  [address], Ph: [phone] or [phone], Fax: [phone]    Prescriptions: PROPOXYPHENE N-APAP 100-650 MG  TABS (PROPOXYPHENE N-APAP) qid as needed  #100 x 2   Entered by:   Tora Perches   Authorized by:   Tresa Garter MD   Signed by:   Tora Perches on 11/06/2007   Method used:   Telephoned to ...       [facility] [phone]*       [address]       [place], [place]  [address]       Ph: [phone] or [phone]       Fax: [phone]   RxID:   [id number]

## 2010-12-02 NOTE — Consult Note (Signed)
Summary: Bilat wrist pain/[facility]  Bilat wrist pain/[facility]   Imported By: Lester [place] 10/23/2008 11:59:43  _____________________________________________________________________  External Attachment:    Type:   Image     Comment:   External Document

## 2010-12-02 NOTE — Progress Notes (Signed)
Summary: Rf Hydroco/APAP  Phone Note Refill Request Message from:  Fax from Pharmacy  Refills Requested: Medication #1:  HYDROCODONE-ACETAMINOPHEN 10-325 MG TABS 1 by mouth qid as needed pain   Dosage confirmed as above?Dosage Confirmed   Supply Requested: 120   Last Refilled: 09/30/2010 Katherine Herrera   Method Requested: Telephone to Pharmacy Next Appointment Scheduled: 12/2010  Follow-up for Phone Call        ok to ref if time Follow-up by: Tresa Garter MD,  October 29, 2010 5:29 PM    Prescriptions: HYDROCODONE-ACETAMINOPHEN 10-325 MG TABS (HYDROCODONE-ACETAMINOPHEN) 1 by mouth qid as needed pain  #120 x 0   Entered by:   Lamar Sprinkles, CMA   Authorized by:   Tresa Garter MD   Signed by:   Lamar Sprinkles, CMA on 10/29/2010   Method used:   Telephoned to ...       [facility]* (retail)       [address]       [place], [place]  [address]       Ph: [phone]       Fax: [phone]   RxID:   [id number]

## 2010-12-02 NOTE — Progress Notes (Signed)
Summary: Levothyroxine  Phone Note From Pharmacy   [facility] Reason for Call: Needs renewal Summary of Call: Requesting renewal on Levothyroxine # 30 take 1 by mouth once daily. Last filled 04/05/08. Initial call taken by: Orlan Leavens,  May 29, 2008 2:10 PM  Follow-up for Phone Call        Sent renewal into pharm. Follow-up by: Orlan Leavens,  May 29, 2008 2:11 PM      Prescriptions: SYNTHROID 75 MCG  TABS (LEVOTHYROXINE SODIUM) Take one tab by mouth once daily  #30 x 6   Entered by:   Orlan Leavens   Authorized by:   Tresa Garter MD   Signed by:   Orlan Leavens on 05/29/2008   Method used:   Electronically sent to ...       [facility]  [address] [id number]       [address] [place], [place]         Ph: [phone] or [phone]       Fax: [phone]   RxID:   [id number]

## 2010-12-02 NOTE — Letter (Signed)
Summary: Last OV note on bottom/[facility]  Last OV note on bottom/[facility]   Imported By: Lester Peterson 11/13/2009 10:27:39  _____________________________________________________________________  External Attachment:    Type:   Image     Comment:   External Document

## 2010-12-02 NOTE — Progress Notes (Signed)
Summary: LETTER  Phone Note Call from Patient Call back at Home Phone [phone]   Summary of Call: Patient is requesting a letter from Dr Posey Rea for disability. She needs it to state dx and details of her limitations. *(ex. unable to sit or stand for long periods, "nerve problems") Initial call taken by: Lamar Sprinkles, CMA,  December 25, 2009 11:43 AM  Follow-up for Phone Call        ok Follow-up by: Tresa Garter MD,  December 29, 2009 1:30 PM  Additional Follow-up for Phone Call Additional follow up Details #1::        Patient notified and requested that letter is mailed to her new address. System update with new address  Additional Follow-up by: Rock Nephew CMA,  December 29, 2009 2:07 PM

## 2010-12-02 NOTE — Letter (Signed)
Summary: Diabetic [facility]oes/[facility]  [facility]   Imported By: Sherian Rein 01/20/2010 09:43:14  _____________________________________________________________________  External Attachment:    Type:   Image     Comment:   External Document

## 2010-12-02 NOTE — Progress Notes (Signed)
Summary: rx request  Phone Note Call from Patient   Summary of Call: Pt called wanting a perscription for Darvocet. She states it is the only medication that helps her with her headaches. I do not see medication in pt's med list. But she states she has taken it before. Please Advise Initial call taken by: Ami Bullins CMA,  July 29, 2009 3:59 PM  Follow-up for Phone Call        It is the same family as Hydroc/APAP - pls use it; can't take both... Follow-up by: Tresa Garter MD,  July 29, 2009 5:36 PM  Additional Follow-up for Phone Call Additional follow up Details #1::        LM with other per MD advisement Additional Follow-up by: Rock Nephew CMA,  July 30, 2009 9:54 AM

## 2010-12-02 NOTE — Letter (Signed)
Summary: [facility]  [facility]   Imported By: Lennie Odor 06/08/2010 11:20:58  _____________________________________________________________________  External Attachment:    Type:   Image     Comment:   External Document

## 2010-12-02 NOTE — Assessment & Plan Note (Signed)
Summary: 4 mos f/u #/cd   Vital Signs:  Patient profile:   45 year old female Weight:      242 pounds Temp:     98.5 degrees F oral Pulse rate:   85 / minute BP sitting:   110 / 80  (left arm)  Vitals Entered By: Tora Perches (January 04, 2010 2:02 PM) CC: f/u Is Patient Diabetic? Yes   CC:  f/u.  History of Present Illness: The patient presents for a follow up of hypertension, diabetes, hyperlipidemia and L foot pain On  Crestor  C/o body aches all over x months  Preventive Screening-Counseling & Management  Alcohol-Tobacco     Smoking Status: current  Current Medications (verified): 1)  Actos 45 Mg  Tabs (Pioglitazone Hcl) .... Take One Tab By Mouth Once Daily 2)  Synthroid 75 Mcg  Tabs (Levothyroxine Sodium) .... Take One Tab By Mouth Once Daily 3)  Glimepiride 1 Mg  Tabs (Glimepiride) .... 1/2 Po Bid 4)  Estrace 1 Mg  Tabs (Estradiol) .Marland Kitchen.. 1 By Mouth Qd 5)  Lamotrigine 25 Mg  Tabs (Lamotrigine) .... Two Times A Day 6)  Crestor 40 Mg Tabs (Rosuvastatin Calcium) .Marland Kitchen.. 1 Tablet By Mouth Daily 7)  Vitamin B-12 Cr 1000 Mcg  Tbcr (Cyanocobalamin) .... Take One Tablet By Mouth Daily 8)  Vitamin D3 1000 Unit  Tabs (Cholecalciferol) .... 2 Qd 9)  Gabapentin 100 Mg Caps (Gabapentin) .Marland Kitchen.. 1 By Mouth Up To Qid As Needed Leg Pain and Tingling 10)  Chantix Starting Month Pak 0.5 Mg X 11 & 1 Mg X 42 Tabs (Varenicline Tartrate) .... As Dirrected 11)  Chantix Continuing Month Pak 1 Mg Tabs (Varenicline Tartrate) .... As Dirrected 12)  Hydrocodone-Acetaminophen 10-325 Mg Tabs (Hydrocodone-Acetaminophen) .Marland Kitchen.. 1 By Mouth Qid As Needed Pain 13)  Tramadol Hcl 50 Mg Tabs (Tramadol Hcl) .Marland Kitchen.. 1-2 Tabs By Mouth Two Times A Day As Needed Pain  Allergies: 1)  ! Demerol 2)  ! * Niaspan 3)  ! * Lovastatin 4)  ! Paxil 5)  ! * Metformin  Past History:  Past Surgical History: Last updated: 01/05/2009 C4-5 anterior cervical fusion Hysterectomy L CTS 2010  Family History: Last updated:  11/13/2007 Family History Lung cance Fr mother with alcoholism  Past Medical History: syncope, poss conversion disorder vs psychogenic seizures Diabetes mellitus, type II Hypothyroidism Low back pain Dr Noel Gerold Peripheral neuropathy Gyn Dr Arelia Sneddon COPD vit d deficient migraine Hyperlipidemia Anxiety Gout (?) Diet coke overuse - tachycardia L foot tarsal tunnel syndr 2010 Dr Marco Collie in [place]  Social History: Occupation: Married 3d GS due Nov Diet Coke: 10 cans a day Alcohol use-no Drug use-no Regular exercise-no Former Smoker 2009 restarted 2010 stopped 12/2009  Review of Systems       The patient complains of weight gain.  The patient denies anorexia, dyspnea on exertion, prolonged cough, and abdominal pain.    Physical Exam  General:  NAD overweight-appearing.   Eyes:  No corneal or conjunctival inflammation noted. EOMI. Perrla. Nose:  External nasal examination shows no deformity or inflammation. Nasal mucosa are pink and moist without lesions or exudates. Mouth:  Oral mucosa and oropharynx without lesions or exudates.  Teeth in good repair. Lungs:  Normal respiratory effort, chest expands symmetrically. Lungs are clear to auscultation, no crackles or wheezes. Heart:  regular rhythm, no gallop, and tachycardia.   Abdomen:  Bowel sounds positive,abdomen soft and non-tender without masses, organomegaly or hernias noted. Msk:  Lumbar-sacral spine is tender to  palpation over paraspinal muscles and painfull with the ROM  Stiff LS back Tender FMS points. L foot hurts dorsally to palp. Extremities:  No edema Neurologic:  No cranial nerve deficits noted. Station and gait are normal. Plantar reflexes are down-going bilaterally. DTRs are symmetrical throughout. Sensory, motor and coordinative functions appear intact. Skin:  Clear Psych:  Cognition and judgment appear intact. Alert and cooperative with normal attention span and concentration. No apparent delusions, illusions,  hallucinations   Impression & Recommendations:  Problem # 1:  DIABETES MELLITUS, TYPE II (ICD-250.00) Assessment Unchanged  Her updated medication list for this problem includes:    Actos 45 Mg Tabs (Pioglitazone hcl) .Marland Kitchen... Take one tab by mouth once daily    Glimepiride 1 Mg Tabs (Glimepiride) .Marland Kitchen... 1/2 po bid  Problem # 2:  HYPERLIPIDEMIA, OTHER UNSPECIFIED (ICD-272.4) Assessment: Unchanged  Her updated medication list for this problem includes:    Crestor 40 Mg Tabs (Rosuvastatin calcium) .Marland Kitchen... 1 tablet by mouth daily  Orders: TLB-B12, Serum-Total ONLY (04540-J81) TLB-BMP (Basic Metabolic Panel-BMET) (80048-METABOL) TLB-Hepatic/Liver Function Pnl (80076-HEPATIC) TLB-A1C / Hgb A1C (Glycohemoglobin) (83036-A1C) TLB-TSH (Thyroid Stimulating Hormone) (84443-TSH) T-Vitamin D (25-Hydroxy) (19147-82956)  Problem # 3:  LOW BACK PAIN (ICD-724.2) Assessment: Unchanged  Her updated medication list for this problem includes:    Hydrocodone-acetaminophen 10-325 Mg Tabs (Hydrocodone-acetaminophen) .Marland Kitchen... 1 by mouth qid as needed pain    Tramadol Hcl 50 Mg Tabs (Tramadol hcl) .Marland Kitchen... 1-2 tabs by mouth two times a day as needed pain  Problem # 4:  B12 DEFICIENCY (ICD-266.2) Assessment: Comment Only  Orders: TLB-B12, Serum-Total ONLY (21308-M57) TLB-BMP (Basic Metabolic Panel-BMET) (80048-METABOL) TLB-Hepatic/Liver Function Pnl (80076-HEPATIC) TLB-A1C / Hgb A1C (Glycohemoglobin) (83036-A1C) TLB-TSH (Thyroid Stimulating Hormone) (84443-TSH) T-Vitamin D (25-Hydroxy) (84696-29528)  Problem # 5:  WEIGHT GAIN, ABNORMAL (ICD-783.1)  Better diet discussed  Orders: TLB-B12, Serum-Total ONLY (41324-M01) TLB-BMP (Basic Metabolic Panel-BMET) (80048-METABOL) TLB-Hepatic/Liver Function Pnl (80076-HEPATIC) TLB-A1C / Hgb A1C (Glycohemoglobin) (83036-A1C) TLB-TSH (Thyroid Stimulating Hormone) (84443-TSH) T-Vitamin D (25-Hydroxy) (02725-36644)  Problem # 6:  ARTHRALGIA (ICD-719.40) - poss  FMS Assessment: New  Hold Crestor  Orders: TLB-B12, Serum-Total ONLY (03474-Q59) TLB-BMP (Basic Metabolic Panel-BMET) (80048-METABOL) TLB-Hepatic/Liver Function Pnl (80076-HEPATIC) TLB-A1C / Hgb A1C (Glycohemoglobin) (83036-A1C) TLB-TSH (Thyroid Stimulating Hormone) (84443-TSH) T-Vitamin D (25-Hydroxy) (56387-56433) T-CK Total (29518-84166)  Complete Medication List: 1)  Actos 45 Mg Tabs (Pioglitazone hcl) .... Take one tab by mouth once daily 2)  Synthroid 75 Mcg Tabs (Levothyroxine sodium) .... Take one tab by mouth once daily 3)  Glimepiride 1 Mg Tabs (Glimepiride) .... 1/2 po bid 4)  Estrace 1 Mg Tabs (Estradiol) .Marland Kitchen.. 1 by mouth qd 5)  Lamotrigine 25 Mg Tabs (Lamotrigine) .... Two times a day 6)  Crestor 40 Mg Tabs (Rosuvastatin calcium) .Marland Kitchen.. 1 tablet by mouth daily 7)  Vitamin B-12 Cr 1000 Mcg Tbcr (Cyanocobalamin) .... Take one tablet by mouth daily 8)  Vitamin D3 1000 Unit Tabs (Cholecalciferol) .... 2 qd 9)  Gabapentin 100 Mg Caps (Gabapentin) .Marland Kitchen.. 1 by mouth up to qid as needed leg pain and tingling 10)  Chantix Starting Month Pak 0.5 Mg X 11 & 1 Mg X 42 Tabs (Varenicline tartrate) .... As dirrected 11)  Chantix Continuing Month Pak 1 Mg Tabs (Varenicline tartrate) .... As dirrected 12)  Hydrocodone-acetaminophen 10-325 Mg Tabs (Hydrocodone-acetaminophen) .Marland Kitchen.. 1 by mouth qid as needed pain 13)  Tramadol Hcl 50 Mg Tabs (Tramadol hcl) .Marland Kitchen.. 1-2 tabs by mouth two times a day as needed pain  Contraindications/Deferment of Procedures/Staging:  Test/Procedure: FLU VAX    Reason for deferment: patient declined   Patient Instructions: 1)  Hold Crestor x 2 wks to see if aches are better 2)  Do not buy chips! 3)  Please schedule a follow-up appointment in 3 months.  Appended Document: Orders Update    Clinical Lists Changes  Orders: Added new Test order of TLB-CK Total Only(Creatine Kinase/CPK) (82550-CK) - Signed

## 2010-12-02 NOTE — Assessment & Plan Note (Signed)
Summary: 4 MO FU/$50/PN--RS'D/CD   Vital Signs:  Patient profile:   45 year old female Weight:      229 pounds Temp:     96.7 degrees F oral Pulse rate:   95 / minute BP sitting:   122 / 72  (left arm)  Vitals Entered By: Tora Perches (September 07, 2009 2:07 PM) CC: f/u Is Patient Diabetic? Yes   CC:  f/u.  History of Present Illness: The patient presents for a follow up of hypertension, diabetes, hyperlipidemia   Preventive Screening-Counseling & Management  Alcohol-Tobacco     Smoking Status: current  Current Medications (verified): 1)  Actos 45 Mg  Tabs (Pioglitazone Hcl) .... Take One Tab By Mouth Once Daily 2)  Synthroid 75 Mcg  Tabs (Levothyroxine Sodium) .... Take One Tab By Mouth Once Daily 3)  Glimepiride 1 Mg  Tabs (Glimepiride) .... 1/2 Po Bid 4)  Estrace 1 Mg  Tabs (Estradiol) .Marland Kitchen.. 1 By Mouth Qd 5)  Lamotrigine 25 Mg  Tabs (Lamotrigine) .... Two Times A Day 6)  Meloxicam 15 Mg Tabs (Meloxicam) .... One By Mouth Daily X 2 Wks, Then Prn 7)  Crestor 40 Mg Tabs (Rosuvastatin Calcium) .Marland Kitchen.. 1 Tablet By Mouth Daily 8)  Amitriptyline Hcl 50 Mg Tabs (Amitriptyline Hcl) .... Take 1-2  Tab By Mouth At Bedtime 9)  Hydrocodone-Acetaminophen 5-325 Mg Tabs (Hydrocodone-Acetaminophen) .Marland Kitchen.. 1 By Mouth Up To 4 Times Per Day As Needed For Pain 10)  Vitamin B-12 Cr 1000 Mcg  Tbcr (Cyanocobalamin) .... Take One Tablet By Mouth Daily 11)  Vitamin D3 1000 Unit  Tabs (Cholecalciferol) .... 2 Qd  Allergies: 1)  ! Demerol 2)  ! * Niaspan 3)  ! * Lovastatin 4)  ! Paxil 5)  ! * Metformin  Past History:  Past Medical History: Last updated: 01/05/2009 syncope, poss conversion disorder vs psychogenic seizures Diabetes mellitus, type II Hypothyroidism Low back pain Dr Noel Gerold Peripheral neuropathy Gyn Dr Arelia Sneddon COPD vit d deficient migraine Hyperlipidemia Anxiety Gout (?)  Social History: Last updated: 05/01/2009 Occupation: Married 3d GS due Nov  Alcohol use-no Drug  use-no Regular exercise-no Former Smoker 2009 restarted 2010  Social History: Smoking Status:  current  Physical Exam  General:  NAD Nose:  External nasal examination shows no deformity or inflammation. Nasal mucosa are pink and moist without lesions or exudates. Mouth:  Oral mucosa and oropharynx without lesions or exudates.  Teeth in good repair. Lungs:  Normal respiratory effort, chest expands symmetrically. Lungs are clear to auscultation, no crackles or wheezes. Heart:  Normal rate and regular rhythm. S1 and S2 normal without gallop, murmur, click, rub or other extra sounds. Abdomen:  Bowel sounds positive,abdomen soft and non-tender without masses, organomegaly or hernias noted. Msk:  Lumbar-sacral spine is tender to palpation over paraspinal muscles and painfull with the ROM  Stiff LS back Neurologic:  No cranial nerve deficits noted. Station and gait are normal. Plantar reflexes are down-going bilaterally. DTRs are symmetrical throughout. Sensory, motor and coordinative functions appear intact.   Impression & Recommendations:  Problem # 1:  DIABETES MELLITUS, TYPE II (ICD-250.00) Assessment Unchanged  Her updated medication list for this problem includes:    Actos 45 Mg Tabs (Pioglitazone hcl) .Marland Kitchen... Take one tab by mouth once daily    Glimepiride 1 Mg Tabs (Glimepiride) .Marland Kitchen... 1/2 po bid  Orders: TLB-B12, Serum-Total ONLY (09811-B14) TLB-BMP (Basic Metabolic Panel-BMET) (80048-METABOL) TLB-CBC Platelet - w/Differential (85025-CBCD) TLB-Hepatic/Liver Function Pnl (80076-HEPATIC) TLB-Lipid Panel (80061-LIPID) TLB-TSH (Thyroid Stimulating  Hormone) (84443-TSH) TLB-Sedimentation Rate (ESR) (85652-ESR) TLB-Udip ONLY (81003-UDIP)  Problem # 2:  B12 DEFICIENCY (ICD-266.2) Assessment: Unchanged  Orders: TLB-B12, Serum-Total ONLY (40981-X91) TLB-BMP (Basic Metabolic Panel-BMET) (80048-METABOL) TLB-CBC Platelet - w/Differential (85025-CBCD) TLB-Hepatic/Liver Function Pnl  (80076-HEPATIC) TLB-Lipid Panel (80061-LIPID) TLB-TSH (Thyroid Stimulating Hormone) (84443-TSH) TLB-Sedimentation Rate (ESR) (85652-ESR) TLB-Udip ONLY (81003-UDIP)  Problem # 3:  COPD (ICD-496) Assessment: Unchanged  Problem # 4:  TOBACCO USE DISORDER/SMOKER-SMOKING CESSATION DISCUSSED (ICD-305.1) Assessment: Comment Only  Encouraged smoking cessation and discussed different methods for smoking cessation.   Problem # 5:  LOW BACK PAIN (ICD-724.2) Assessment: Unchanged See "Patient Instructions".  Her updated medication list for this problem includes:    Meloxicam 15 Mg Tabs (Meloxicam) ..... One by mouth daily x 2 wks, then prn    Hydrocodone-acetaminophen 5-325 Mg Tabs (Hydrocodone-acetaminophen) .Marland Kitchen... 1 by mouth up to 4 times per day as needed for pain    Propoxacet-n 100-650 Mg Tabs (Propoxyphene n-apap) .Marland Kitchen... 1 by mouth as needed headache qid  Complete Medication List: 1)  Actos 45 Mg Tabs (Pioglitazone hcl) .... Take one tab by mouth once daily 2)  Synthroid 75 Mcg Tabs (Levothyroxine sodium) .... Take one tab by mouth once daily 3)  Glimepiride 1 Mg Tabs (Glimepiride) .... 1/2 po bid 4)  Estrace 1 Mg Tabs (Estradiol) .Marland Kitchen.. 1 by mouth qd 5)  Lamotrigine 25 Mg Tabs (Lamotrigine) .... Two times a day 6)  Meloxicam 15 Mg Tabs (Meloxicam) .... One by mouth daily x 2 wks, then prn 7)  Crestor 40 Mg Tabs (Rosuvastatin calcium) .Marland Kitchen.. 1 tablet by mouth daily 8)  Hydrocodone-acetaminophen 5-325 Mg Tabs (Hydrocodone-acetaminophen) .Marland Kitchen.. 1 by mouth up to 4 times per day as needed for pain 9)  Vitamin B-12 Cr 1000 Mcg Tbcr (Cyanocobalamin) .... Take one tablet by mouth daily 10)  Vitamin D3 1000 Unit Tabs (Cholecalciferol) .... 2 qd 11)  Propoxacet-n 100-650 Mg Tabs (Propoxyphene n-apap) .Marland Kitchen.. 1 by mouth as needed headache qid 12)  Gabapentin 100 Mg Caps (Gabapentin) .Marland Kitchen.. 1 by mouth up to qid as needed leg pain and tingling  Patient Instructions: 1)  Try to eat more raw plant food, fresh  and dry fruit, raw almonds, leafy vegetables, whole foods and less red meat, less animal fat. Poultry and fish is better for you than pork and beef. Avoid processed foods (canned soups, hot dogs, sausage, bacon , frozen dinners). Avoid corn syrup, high fructose syrup or aspartam and Splenda  containing drinks. Honey, Agave and Stevia are better sweeteners. Make your own  dressing with olive oil, wine vinegar, lemon juce, garlic etc. for your salads. 2)  Start taking a yoga class  3)  Use stretching exercises that I have provided (15 min. or longer every day) 4)  Please schedule a follow-up appointment in 4 months. Prescriptions: GABAPENTIN 100 MG CAPS (GABAPENTIN) 1 by mouth up to qid as needed leg pain and tingling  #120 x 6   Entered and Authorized by:   Tresa Garter MD   Signed by:   Tresa Garter MD on 09/07/2009   Method used:   Print then Give to Patient   RxID:   [id number] PROPOXACET-N 100-650 MG TABS (PROPOXYPHENE N-APAP) 1 by mouth as needed headache qid  #60 x 3   Entered and Authorized by:   Tresa Garter MD   Signed by:   Tresa Garter MD on 09/07/2009   Method used:   Print then Give to Patient   RxID:   [id number]

## 2010-12-02 NOTE — Letter (Signed)
Summary: CMN for [facility]oes & [facility]s/[facility]  [facility] for [facility]oes & [facility]s/[facility]   Imported By: Sherian Rein 01/27/2010 11:40:02  _____________________________________________________________________  External Attachment:    Type:   Image     Comment:   External Document

## 2010-12-02 NOTE — Letter (Signed)
Summary: [facility]   Imported By: Sherian Rein 01/29/2009 08:28:37  _____________________________________________________________________  External Attachment:    Type:   Image     Comment:   External Document

## 2010-12-02 NOTE — Progress Notes (Signed)
Summary: crestor, actos  Phone Note Other Incoming Call back at fax   Caller: [facility] Details for Reason: refill on crestor, actos Details of Action Taken: ok x 3 refills--will fax back to pharmacy/vg Initial call taken by: Tora Perches,  December 28, 2009 12:06 PM    Prescriptions: CRESTOR 40 MG TABS (ROSUVASTATIN CALCIUM) 1 tablet by mouth daily  #90 x 3   Entered by:   Tora Perches   Authorized by:   Tresa Garter MD   Signed by:   Tora Perches on 12/28/2009   Method used:   Faxed to ...       [facility]* (mail-order)             ,          Ph: [phone]       Fax: [phone]   RxID:   [id number] ACTOS 45 MG  TABS (PIOGLITAZONE HCL) Take one tab by mouth once daily  #90 x 3   Entered by:   Tora Perches   Authorized by:   Tresa Garter MD   Signed by:   Tora Perches on 12/28/2009   Method used:   Faxed to ...       [facility]* (mail-order)             ,          Ph: [phone]       Fax: [phone]   RxID:   [id number]

## 2010-12-02 NOTE — Assessment & Plan Note (Signed)
Summary: SURGERY CLEARANCE-ELEVATED BLOOD COUNT-FOOT SURGERY-$50-PER S...   Vital Signs:  Patient profile:   45 year old female Weight:      221 pounds Temp:     96.7 degrees F oral Pulse rate:   101 / minute BP sitting:   102 / 74  (left arm)  Vitals Entered By: Tora Perches (May 01, 2009 2:40 PM) CC: f/u Is Patient Diabetic? Yes   CC:  f/u.  History of Present Illness: IM Consult Req Dr Marquette Old fax [phone] Podiatry Reuel Boom) Reason: Med clearance for L foot surgery  45 yo pt with abn CBC, DM, HTN  Current Medications (verified): 1)  Actos 45 Mg  Tabs (Pioglitazone Hcl) .... Take One Tab By Mouth Once Daily 2)  Synthroid 75 Mcg  Tabs (Levothyroxine Sodium) .... Take One Tab By Mouth Once Daily 3)  Glimepiride 1 Mg  Tabs (Glimepiride) .... 1/2 Po Q Am 4)  Estrace 1 Mg  Tabs (Estradiol) .Marland Kitchen.. 1 By Mouth Qd 5)  Lamotrigine 25 Mg  Tabs (Lamotrigine) .... Two Times A Day 6)  Zolpidem Tartrate 10 Mg  Tabs (Zolpidem Tartrate) .... 1/2 or 1 By Mouth At [facility] Prn 7)  Meloxicam 15 Mg Tabs (Meloxicam) .... One By Mouth Daily X 2 Wks, Then Prn 8)  Crestor 40 Mg Tabs (Rosuvastatin Calcium) .Marland Kitchen.. 1 Tablet By Mouth Daily 9)  Amitriptyline Hcl 50 Mg Tabs (Amitriptyline Hcl) .... Take 1-2  Tab By Mouth At Bedtime 10)  Hydrocodone-Acetaminophen 5-325 Mg Tabs (Hydrocodone-Acetaminophen) .Marland Kitchen.. 1 By Mouth Up To 4 Times Per Day As Needed For Pain 11)  Spironolactone 50 Mg  Tabs (Spironolactone) .Marland Kitchen.. 1 By Mouth Once Daily in Am 12)  Vitamin B-12 Cr 1000 Mcg  Tbcr (Cyanocobalamin) .... Take One Tablet By Mouth Daily 13)  Vitamin D3 1000 Unit  Tabs (Cholecalciferol) .... 2 Qd 14)  Darvocet-N 100 100-650 Mg Tabs (Propoxyphene N-Apap) .Marland Kitchen.. 1 By Mouth 4 Times Daily As Neede For Pain  Allergies: 1)  ! Demerol 2)  ! * Niaspan 3)  ! * Lovastatin 4)  ! Paxil 5)  ! 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  The patient denies anorexia, fever, weight loss, weight gain, vision loss, decreased hearing, hoarseness, chest pain, syncope, dyspnea on exertion, peripheral edema, prolonged cough, headaches, hemoptysis, abdominal pain, melena, hematochezia, severe indigestion/heartburn, hematuria, incontinence, genital sores, muscle weakness, suspicious skin lesions, transient blindness, depression, unusual weight change, abnormal bleeding, enlarged lymph nodes, angioedema, and breast masses.    Physical Exam  General:  NAD Eyes:  No corneal or conjunctival inflammation noted. EOMI. Perrla. Funduscopic exam benign, without hemorrhages, exudates or papilledema. Vision grossly normal. Nose:  External nasal examination shows no deformity or inflammation. Nasal mucosa are pink and moist without  lesions or exudates. Mouth:  Oral mucosa and oropharynx without lesions or exudates.  Teeth in good repair. Neck:  No deformities, masses, or tenderness noted. Lungs:  Normal respiratory effort, chest expands symmetrically. Lungs are clear to auscultation, no crackles or wheezes. Heart:  Normal rate and regular rhythm. S1 and S2 normal without gallop, murmur, click, rub or  other extra sounds. Abdomen:  Bowel sounds positive,abdomen soft and non-tender without masses, organomegaly or hernias noted. Msk:  L foot dorsum is swollen and tender Neurologic:  No cranial nerve deficits noted. Station and gait are normal. Plantar reflexes are down-going bilaterally. DTRs are symmetrical throughout. Sensory, motor and coordinative functions appear intact. Skin:  Intact without suspicious lesions or rashes Psych:  Cognition and judgment appear intact. Alert and cooperative with normal attention span and concentration. No apparent delusions, illusions, hallucinations   Impression & Recommendations:  Problem # 1:  PREOPERATIVE EXAMINATION (ICD-V72.84) Assessment Comment Only We discussed her abn CBC. Most likely it is related to smoking. We will get a pathology peripheral smear for a better understanding. She was adviced to stop smoking. Get a CXR.She should be clear for surgery if ordered tests are acceptable.Marland KitchenMarland KitchenRTC 1 months. Thank you! Orders: TLB-Udip ONLY (81003-UDIP) T- * Misc. Laboratory test [phone]) T-2 View CXR, Same Day (71020.5TC)  Problem # 2:  CBC, ABNORMAL (ICD-790.99) Assessment: Comment Only Cut back on smoking! Doubt other hematologic disease Orders: TLB-Udip ONLY (81003-UDIP) T- * Misc. Laboratory test [id number]) TLB-Sedimentation Rate (ESR) ([id number]-ESR) T-2 View CXR, Same Day (71020.5TC)  Problem # 3:  EDEMA (ICD-782.3) Assessment: Unchanged  Her updated medication list for this problem includes:    Spironolactone 50 Mg Tabs (Spironolactone) .Marland Kitchen... 1 by mouth once daily in am  Orders: T-2 View CXR, Same Day (71020.5TC)  Problem # 4:  B12 DEFICIENCY (ICD-266.2) Assessment: Unchanged On prescription therapy   Problem # 5:  TOBACCO USE DISORDER/SMOKER-SMOKING CESSATION DISCUSSED (ICD-305.1) Assessment: Deteriorated  Encouraged smoking cessation and discussed different methods for smoking cessation.   Problem # 6:  COPD (ICD-496) Assessment:  Unchanged  Problem # 7:  HYPOTHYROIDISM (ICD-244.9) Assessment: Unchanged  Her updated medication list for this problem includes:    Synthroid 75 Mcg Tabs (Levothyroxine sodium) .Marland Kitchen... Take one tab by mouth once daily  Problem # 8:  DIABETES MELLITUS, TYPE II (ICD-250.00) Assessment: Unchanged  Her updated medication list for this problem includes:    Actos 45 Mg Tabs (Pioglitazone hcl) .Marland Kitchen... Take one tab by mouth once daily    Glimepiride 1 Mg Tabs (Glimepiride) .Marland Kitchen... 1/2 po bid  Labs Reviewed: Creat: 0.7 (04/20/2009)    Reviewed HgBA1c results: 7.5 (04/20/2009)  8.0 (12/29/2008)  Complete Medication List: 1)  Actos 45 Mg Tabs (Pioglitazone hcl) .... Take one tab by mouth once daily 2)  Synthroid 75 Mcg Tabs (Levothyroxine sodium) .... Take one tab by mouth once daily 3)  Glimepiride 1 Mg Tabs (Glimepiride) .... 1/2 po bid 4)  Estrace 1 Mg Tabs (Estradiol) .Marland Kitchen.. 1 by mouth qd 5)  Lamotrigine 25 Mg Tabs (Lamotrigine) .... Two times a day 6)  Zolpidem Tartrate 10 Mg Tabs (Zolpidem tartrate) .... 1/2 or 1 by mouth at hs prn 7)  Meloxicam 15 Mg Tabs (Meloxicam) .... One by mouth daily x 2 wks, then prn 8)  Crestor 40 Mg Tabs (Rosuvastatin calcium) .Marland Kitchen.. 1 tablet by mouth daily 9)  Amitriptyline Hcl 50 Mg Tabs (Amitriptyline hcl) .... Take 1-2  tab by mouth at bedtime 10)  Hydrocodone-acetaminophen 5-325 Mg Tabs (Hydrocodone-acetaminophen) .Marland KitchenMarland KitchenMarland Kitchen  1 by mouth up to 4 times per day as needed for pain 11)  Spironolactone 50 Mg Tabs (Spironolactone) .Marland Kitchen.. 1 by mouth once daily in am 12)  Vitamin B-12 Cr 1000 Mcg Tbcr (Cyanocobalamin) .... Take one tablet by mouth daily 13)  Vitamin D3 1000 Unit Tabs (Cholecalciferol) .... 2 qd 14)  Darvocet-n 100 100-650 Mg Tabs (Propoxyphene n-apap) .Marland Kitchen.. 1 by mouth 4 times daily as neede for pain  Patient Instructions: 1)  Please schedule a follow-up appointment in 1 month. 2)  Try to eat more raw plant food, fresh and dry fruit, raw almonds, leafy vegies,  whole foods and less meat, animal fat. Avoid processed foods, (canned soups, hot dogs, sausage , frozen dinners), animal fat, red meat.  3)  Please schedule a follow-up appointment in 1 month. 4)  BMP prior to visit, ICD-9: 5)  CBC w/ Diff prior to visit, ICD-9: 995.20  Appended Document: SURGERY CLEARANCE-ELEVATED BLOOD COUNT-FOOT SURGERY-$50-PER S... copy faxed to both Timothy Vogler in [place] and [facility]  Appended Document: SURGERY CLEARANCE-ELEVATED BLOOD COUNT-FOOT SURGERY-$50-PER S... DM, HTN are stable. Abn CBC is a new problem.

## 2010-12-02 NOTE — Progress Notes (Signed)
Summary: Refill--Ibuprofen  Phone Note Refill Request Message from:  Fax from Pharmacy on April 14, 2010 11:21 AM  Refills Requested: Medication #1:  Ibuprofen 600mg  This is currently not on patient med list. Please advise.  Initial call taken by: Lucious Groves,  April 14, 2010 11:22 AM  Follow-up for Phone Call        ok Follow-up by: Tresa Garter MD,  April 14, 2010 1:22 PM  Additional Follow-up for Phone Call Additional follow up Details #1::        Written rx given to pt at office visit monday Additional Follow-up by: Lamar Sprinkles, CMA,  April 14, 2010 3:44 PM

## 2010-12-02 NOTE — Assessment & Plan Note (Signed)
Summary: 3 mo rov /nws $50   Vital Signs:  Patient Profile:   45 Years Old Female Weight:      251 pounds Temp:     98.5 degrees F oral Pulse rate:   116 / minute BP sitting:   112 / 80  (left arm)  Vitals Entered By: Tora Perches (January 05, 2009 1:46 PM)                 Chief Complaint:  Multiple medical problems or concerns.  History of Present Illness: The patient presents for a follow up of hypertension, diabetes, hyperlipidemia C/o L footpain, swelling and a  knot and LE swelling and pain x months, worse over past months . She has an appt w/podiatrist this Wed, having xrays today    Prior Medications Reviewed Using: Patient Recall  Prior Medication List:  ACTOS 45 MG  TABS (PIOGLITAZONE HCL) Take one tab by mouth once daily SYNTHROID 75 MCG  TABS (LEVOTHYROXINE SODIUM) Take one tab by mouth once daily GLIMEPIRIDE 1 MG  TABS (GLIMEPIRIDE) 1po q am ESTRACE 1 MG  TABS (ESTRADIOL) 1 by mouth qd LAMOTRIGINE 25 MG  TABS (LAMOTRIGINE) two times a day ZOLPIDEM TARTRATE 10 MG  TABS (ZOLPIDEM TARTRATE) 1/2 or 1 by mouth at hs prn MELOXICAM 15 MG TABS (MELOXICAM) one by mouth daily x 2 wks, then prn DARVOCET-N 100 100-650 MG TABS (PROPOXYPHENE N-APAP) 1 by mouth two times a day as needed pain CRESTOR 40 MG TABS (ROSUVASTATIN CALCIUM) 1 tablet by mouth daily AMITRIPTYLINE HCL 50 MG TABS (AMITRIPTYLINE HCL) Take 1-2  tab by mouth at bedtime IBUPROFEN 600 MG  TABS (IBUPROFEN) 1 by mouth two times a day pc x 3 wks, then prn HYDROCODONE-ACETAMINOPHEN 5-325 MG TABS (HYDROCODONE-ACETAMINOPHEN) 1 by mouth up to 4 times per day as needed for pain VITAMIN D3 1000 UNIT  TABS (CHOLECALCIFEROL) 2 qd   Current Allergies (reviewed today): ! DEMEROL ! * NIASPAN ! * LOVASTATIN ! PAXIL ! * METFORMIN  Past Medical History:    Reviewed history from 07/28/2008 and no changes required:       syncope, poss conversion disorder vs psychogenic seizures       Diabetes mellitus, type II  Hypothyroidism       Low back pain Dr Noel Gerold       Peripheral neuropathy       Gyn Dr Arelia Sneddon       COPD       vit d deficient       migraine       Hyperlipidemia       Anxiety       Gout (?)  Past Surgical History:    C4-5 anterior cervical fusion    Hysterectomy    L CTS 2010   Family History:    Reviewed history from 11/13/2007 and no changes required:       Family History Lung cance Fr       mother with alcoholism  Social History:    Reviewed history from 07/28/2008 and no changes required:       Occupation:       Married 3d GS due Nov              Alcohol use-no       Drug use-no       Regular exercise-no       Former Smoker 2009    Review of Systems  The patient denies fever, weight gain, prolonged cough,  hemoptysis, and abdominal pain.     Physical Exam  General:     NAD Ears:     External ear exam shows no significant lesions or deformities.  Otoscopic examination reveals clear canals, tympanic membranes are intact bilaterally without bulging, retraction, inflammation or discharge. Hearing is grossly normal bilaterally. Nose:     External nasal examination shows no deformity or inflammation. Nasal mucosa are pink and moist without lesions or exudates. Mouth:     Oral mucosa and oropharynx without lesions or exudates.  Teeth in good repair. Lungs:     Normal respiratory effort, chest expands symmetrically. Lungs are clear to auscultation, no crackles or wheezes. Heart:     Normal rate and regular rhythm. S1 and S2 normal without gallop, murmur, click, rub or other extra sounds. Abdomen:     Bowel sounds positive,abdomen soft and non-tender without masses, organomegaly or hernias noted. Msk:     L hand is swollen post op L foot dorsum is swollen and tender Extremities:     trace left pedal edema.   Neurologic:     No cranial nerve deficits noted. Station and gait are normal. Plantar reflexes are down-going bilaterally. DTRs are symmetrical throughout.  Sensory, motor and coordinative functions appear intact. Skin:     Intact without suspicious lesions or rashes Psych:     Cognition and judgment appear intact. Alert and cooperative with normal attention span and concentration. No apparent delusions, illusions, hallucinations    Impression & Recommendations:  Problem # 1:  FOOT PAIN (ICD-729.5) L due to #2 likely Assessment: New  Problem # 2:  GOUT (ICD-274.9) Assessment: New  The following medications were removed from the medication list:    Ibuprofen 600 Mg Tabs (Ibuprofen) .Marland Kitchen... 1 by mouth two times a day pc x 3 wks, then prn  Her updated medication list for this problem includes:    Meloxicam 15 Mg Tabs (Meloxicam) ..... One by mouth daily x 2 wks, then prn   Problem # 3:  EDEMA (ICD-782.3) Assessment: New  Her updated medication list for this problem includes:    Spironolactone 50 Mg Tabs (Spironolactone) .Marland Kitchen... 1 by mouth once daily in am   Problem # 4:  DIABETES MELLITUS, TYPE II (ICD-250.00) Assessment: Deteriorated  Her updated medication list for this problem includes:    Actos 45 Mg Tabs (Pioglitazone hcl) .Marland Kitchen... Take one tab by mouth once daily    Glimepiride 1 Mg Tabs (Glimepiride) .Marland Kitchen... 1po q am  Labs Reviewed: HgBA1c: 8.0 (12/29/2008)   Creat: 0.7 (12/29/2008)      Problem # 5:  B12 DEFICIENCY (ICD-266.2) Assessment: New Start Rx  Problem # 6:  HYPOTHYROIDISM, UNSPECIFIED (ICD-244.9) Assessment: Comment Only  Her updated medication list for this problem includes:    Synthroid 75 Mcg Tabs (Levothyroxine sodium) .Marland Kitchen... Take one tab by mouth once daily  Labs Reviewed: TSH: 3.37 (12/29/2008)    HgBA1c: 8.0 (12/29/2008) Chol: 214 (12/29/2008)   HDL: 41.4 (12/29/2008)   LDL: 96.7 (12/29/2008)   TG: 403 (12/29/2008)   Complete Medication List: 1)  Actos 45 Mg Tabs (Pioglitazone hcl) .... Take one tab by mouth once daily 2)  Synthroid 75 Mcg Tabs (Levothyroxine sodium) .... Take one tab by mouth once  daily 3)  Glimepiride 1 Mg Tabs (Glimepiride) .Marland Kitchen.. 1po q am 4)  Estrace 1 Mg Tabs (Estradiol) .Marland Kitchen.. 1 by mouth qd 5)  Lamotrigine 25 Mg Tabs (Lamotrigine) .... Two times a day 6)  Zolpidem Tartrate 10 Mg Tabs (Zolpidem  tartrate) .... 1/2 or 1 by mouth at hs prn 7)  Meloxicam 15 Mg Tabs (Meloxicam) .... One by mouth daily x 2 wks, then prn 8)  Crestor 40 Mg Tabs (Rosuvastatin calcium) .Marland Kitchen.. 1 tablet by mouth daily 9)  Amitriptyline Hcl 50 Mg Tabs (Amitriptyline hcl) .... Take 1-2  tab by mouth at bedtime 10)  Hydrocodone-acetaminophen 5-325 Mg Tabs (Hydrocodone-acetaminophen) .Marland Kitchen.. 1 by mouth up to 4 times per day as needed for pain 11)  Spironolactone 50 Mg Tabs (Spironolactone) .Marland Kitchen.. 1 by mouth once daily in am 12)  Vitamin B-12 Cr 1000 Mcg Tbcr (Cyanocobalamin) .... Take one tablet by mouth daily 13)  Vitamin D3 1000 Unit Tabs (Cholecalciferol) .... 2 qd   Patient Instructions: 1)  Please schedule a follow-up appointment in 3 months. 2)  BMP prior to visit, ICD-9: 3)  Hepatic Panel prior to visit, ICD-9: 4)  HbgA1C prior to visit, ICD-9: 5)  uric acid 6)  Vit D 7)  Vit B12 266.0   250.00   Prescriptions: VITAMIN B-12 CR 1000 MCG  TBCR (CYANOCOBALAMIN) Take one tablet by mouth daily  #30 x 12   Entered and Authorized by:   Tresa Garter MD   Signed by:   Tresa Garter MD on 01/05/2009   Method used:   Print then Give to Patient   RxID:   [id number] HYDROCODONE-ACETAMINOPHEN 5-325 MG TABS (HYDROCODONE-ACETAMINOPHEN) 1 by mouth up to 4 times per day as needed for pain  #120 x 1   Entered and Authorized by:   Tresa Garter MD   Signed by:   Tresa Garter MD on 01/05/2009   Method used:   Print then Give to Patient   RxID:   [id number] MELOXICAM 15 MG TABS (MELOXICAM) one by mouth daily x 2 wks, then prn  #30 x 3   Entered and Authorized by:   Tresa Garter MD   Signed by:   Tresa Garter MD on 01/05/2009   Method used:   Print then Give  to Patient   RxID:   [id number] SPIRONOLACTONE 50 MG  TABS (SPIRONOLACTONE) 1 by mouth once daily in AM  #30 x 6   Entered and Authorized by:   Tresa Garter MD   Signed by:   Tresa Garter MD on 01/05/2009   Method used:   Print then Give to Patient   RxID:   [id number]

## 2010-12-02 NOTE — Assessment & Plan Note (Signed)
Summary: 3 MO ROV /NWS   Vital Signs:  Patient profile:   45 year old female Height:      69 inches Weight:      245.25 pounds BMI:     36.35 O2 Sat:      95 % on Room air Temp:     97.1 degrees F oral Pulse rate:   81 / minute BP sitting:   120 / 74  (left arm) Cuff size:   large  Vitals Entered By: Lucious Groves (April 12, 2010 2:32 PM)  O2 Flow:  Room air CC: 3 mo rtn ov./kb Is Patient Diabetic? Yes Pain Assessment Patient in pain? no        CC:  3 mo rtn ov./kb.  History of Present Illness: The patient presents for a follow up of hypertension, diabetes, hyperlipidemia F/u foot pain CBG<120  Current Medications (verified): 1)  Actos 45 Mg  Tabs (Pioglitazone Hcl) .... Take One Tab By Mouth Once Daily 2)  Synthroid 75 Mcg  Tabs (Levothyroxine Sodium) .... Take One Tab By Mouth Once Daily 3)  Glimepiride 1 Mg  Tabs (Glimepiride) .... 1/2 Po Bid 4)  Estrace 1 Mg  Tabs (Estradiol) .Marland Kitchen.. 1 By Mouth Qd 5)  Lamotrigine 25 Mg  Tabs (Lamotrigine) .... Two Times A Day 6)  Crestor 40 Mg Tabs (Rosuvastatin Calcium) .Marland Kitchen.. 1 Tablet By Mouth Daily 7)  Vitamin B-12 Cr 1000 Mcg  Tbcr (Cyanocobalamin) .... Take One Tablet By Mouth Daily 8)  Vitamin D3 1000 Unit  Tabs (Cholecalciferol) .... 2 Qd 9)  Gabapentin 100 Mg Caps (Gabapentin) .Marland Kitchen.. 1 By Mouth Up To Qid As Needed Leg Pain and Tingling 10)  Chantix Starting Month Pak 0.5 Mg X 11 & 1 Mg X 42 Tabs (Varenicline Tartrate) .... As Dirrected 11)  Chantix Continuing Month Pak 1 Mg Tabs (Varenicline Tartrate) .... As Dirrected 12)  Hydrocodone-Acetaminophen 10-325 Mg Tabs (Hydrocodone-Acetaminophen) .Marland Kitchen.. 1 By Mouth Qid As Needed Pain 13)  Tramadol Hcl 50 Mg Tabs (Tramadol Hcl) .Marland Kitchen.. 1-2 Tabs By Mouth Two Times A Day As Needed Pain  Allergies (verified): 1)  ! Demerol 2)  ! * Niaspan 3)  ! * Lovastatin 4)  ! Paxil 5)  ! 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    Physical Exam  General:  NAD overweight-appearing.   Nose:  External nasal examination shows no deformity or inflammation. Nasal mucosa are pink and moist without lesions or exudates. Mouth:  Oral mucosa and oropharynx without lesions or exudates.  Teeth in good repair. Neck:  No deformities, masses, or tenderness noted. Lungs:  Normal respiratory effort, chest expands symmetrically. Lungs are clear to auscultation, no crackles or wheezes. Heart:  regular rhythm, no gallop, and tachycardia.   Abdomen:  Bowel sounds positive,abdomen soft and non-tender without masses, organomegaly or hernias noted. Msk:  Lumbar-sacral spine is tender to palpation over paraspinal muscles and painfull with the ROM  Stiff LS back Tender FMS points. L foot hurts dorsally to palp. Neurologic:  No cranial nerve deficits noted. Station and gait are normal. Plantar reflexes are down-going bilaterally. DTRs are symmetrical throughout. Sensory, motor and coordinative functions appear intact. Skin:  Clear Psych:  Cognition and judgment appear intact. Alert and cooperative with normal attention span and concentration. No apparent delusions, illusions, hallucinations   Impression & Recommendations:  Problem # 1:  FOOT PAIN (ICD-729.5) Assessment Unchanged Waiting on Surgery  Problem # 2:  B12 DEFICIENCY (ICD-266.2) Assessment: Comment Only On  prescription drug  therapy   Problem # 3:  HYPERLIPIDEMIA (ICD-272.4) Assessment: Unchanged  Her updated medication list for this problem includes:    Crestor 40 Mg Tabs (Rosuvastatin calcium) .Marland Kitchen... 1 tablet by mouth daily  Problem # 4:  DIABETES MELLITUS, TYPE II (ICD-250.00) Assessment: Unchanged  Her updated medication list for this problem includes:    Actos 45 Mg Tabs (Pioglitazone hcl) .Marland Kitchen... Take one tab by mouth once daily    Glimepiride 1 Mg Tabs (Glimepiride) .Marland Kitchen... 1/2 po bid  Complete Medication List: 1)  Actos 45 Mg Tabs (Pioglitazone hcl) .... Take one tab by mouth once daily 2)  Synthroid 75 Mcg Tabs (Levothyroxine sodium) .... Take one tab by mouth once daily 3)  Glimepiride 1 Mg Tabs (Glimepiride) .... 1/2 po bid 4)  Estrace 1 Mg Tabs (Estradiol) .Marland Kitchen.. 1 by mouth qd 5)  Lamotrigine 25 Mg Tabs (Lamotrigine) .... Two times a day 6)  Crestor 40 Mg Tabs (Rosuvastatin calcium) .Marland Kitchen.. 1 tablet by mouth daily 7)  Vitamin B-12 Cr 1000 Mcg Tbcr (Cyanocobalamin) .... Take one tablet by mouth daily 8)  Vitamin D3 1000 Unit Tabs (Cholecalciferol) .... 2 qd 9)  Gabapentin 100 Mg Caps (Gabapentin) .Marland Kitchen.. 1 by mouth up to qid as needed leg pain and tingling 10)  Chantix Starting Month Pak 0.5 Mg X 11 & 1 Mg X 42 Tabs (Varenicline tartrate) .... As dirrected 11)  Chantix Continuing Month Pak 1 Mg Tabs (Varenicline tartrate) .... As dirrected 12)  Hydrocodone-acetaminophen 10-325 Mg Tabs (Hydrocodone-acetaminophen) .Marland Kitchen.. 1 by mouth qid as needed pain 13)  Tramadol Hcl 50 Mg Tabs (Tramadol hcl) .Marland Kitchen.. 1-2 tabs by mouth two times a day as needed pain 14)  Gabapentin 300 Mg Caps (Gabapentin) .Marland Kitchen.. 1 by mouth three times a day as needed 15)  Ibuprofen 600 Mg Tabs (Ibuprofen) .Marland Kitchen.. 1 by mouth bid  pc x 1 wk then as needed for  pain  Patient Instructions: 1)  Please schedule a follow-up appointment in 3 months. 2)  BMP prior to visit, ICD-9: 3)  Hepatic Panel prior to visit, ICD-9: 4)  Lipid Panel  prior to visit, ICD-9: 250.00 995.20 5)  TSH prior to visit, ICD-9: 6)  CBC w/ Diff prior to visit, ICD-9: 7)  HbgA1C prior to visit, ICD-9: 8)  Urine Microalbumin prior to visit, ICD-9: Prescriptions: IBUPROFEN 600 MG TABS (IBUPROFEN) 1 by mouth bid  pc x 1 wk then as needed for  pain  #60 x 3   Entered and Authorized by:   Tresa Garter MD   Signed by:   Tresa Garter MD on 04/14/2010   Method used:   Print then Give to Patient   RxID:   [id number] GABAPENTIN 300 MG CAPS (GABAPENTIN) 1 by mouth three times a day as needed  #90 x 3   Entered by:   Lucious Groves   Authorized by:   Tresa Garter MD  Signed by:   Tresa Garter MD on 04/14/2010   Method used:   Print then Give to Patient   RxID:   [id number]

## 2010-12-02 NOTE — Assessment & Plan Note (Signed)
Summary: FU $50 STC   Vital Signs:  Patient Profile:   45 Years Old Female Weight:      231 pounds Temp:     97 degrees F oral Pulse rate:   85 / minute BP sitting:   114 / 77  (left arm)  Vitals Entered By: Tora Perches (April 21, 2008 3:22 PM)                 Chief Complaint:  Multiple medical problems or concerns.  History of Present Illness: C/o R foot pain, saw a poditrist - was told she had a high arch. tingling/pain between L 2-3 toes. C/o abd pains x 1.5 wks: lower back and to the front, "like labour". C/o insomnia.    Current Allergies (reviewed today): ! DEMEROL ! * NIASPAN ! * LOVASTATIN ! PAXIL ! * METFORMIN  Past Medical History:    Reviewed history from 11/13/2007 and no changes required:       syncope, poss conversion disorder vs psychogenic seizures       Diabetes mellitus, type II       dyslipidemia       anxiety       Hypothyroidism       Low back pain Dr Noel Gerold       Peripheral neuropathy       Gyn Dr Arelia Sneddon       COPD       vit d deficient       migraine  Past Surgical History:    Reviewed history from 11/13/2007 and no changes required:       C4-5 anterior cervical fusion       Hysterectomy   Family History:    Reviewed history from 11/13/2007 and no changes required:       Family History Lung cance Fr       mother with alcoholism  Social History:    Reviewed history from 08/20/2007 and no changes required:       Occupation:       Married       Current Smoker       Alcohol use-no       Drug use-no       Regular exercise-no    Review of Systems       The patient complains of abdominal pain and difficulty walking.  The patient denies fever, chest pain, syncope, melena, hematochezia, and depression.     Physical Exam  General:     overweight-appearing.   Head:     Normocephalic and atraumatic without obvious abnormalities. No apparent alopecia or balding. Nose:     External nasal examination shows no deformity or  inflammation. Nasal mucosa are pink and moist without lesions or exudates. Mouth:     Oral mucosa and oropharynx without lesions or exudates.  Teeth in good repair. Neck:     No deformities, masses, or tenderness noted. Lungs:     Normal respiratory effort, chest expands symmetrically. Lungs are clear to auscultation, no crackles or wheezes. Heart:     Normal rate and regular rhythm. S1 and S2 normal without gallop, murmur, click, rub or other extra sounds. Abdomen:     Bowel sounds positive,abdomen soft and non-tender without masses, organomegaly or hernias noted. Msk:     Lumbar-sacral spine is tender to palpation over paraspinal muscles and painfull with the ROM R lat foot tender with a lat callus L pain between toes #2-3 Neurologic:     No cranial  nerve deficits noted. Station and gait are normal. Plantar reflexes are down-going bilaterally. DTRs are symmetrical throughout. Sensory, motor and coordinative functions appear intact. Skin:     Intact without suspicious lesions or rashes Psych:     Cognition and judgment appear intact. Alert and cooperative with normal attention span and concentration. No apparent delusions, illusions, hallucinations    Impression & Recommendations:  Problem # 1:  DIABETES MELLITUS, TYPE II (ICD-250.00) Assessment: Unchanged  Her updated medication list for this problem includes:    Actos 45 Mg Tabs (Pioglitazone hcl) .Marland Kitchen... Take one tab by mouth once daily    Glimepiride 1 Mg Tabs (Glimepiride) .Marland Kitchen... 1po q am  Labs Reviewed: HgBA1c: 6.7 (04/18/2008)   Creat: 0.8 (04/18/2008)      Problem # 2:  HYPERLIPIDEMIA, OTHER UNSPECIFIED (ICD-272.4) Assessment: Unchanged  Her updated medication list for this problem includes:    Vytorin 10-40 Mg Tabs (Ezetimibe-simvastatin) .Marland Kitchen... 1 by mouth qd   Problem # 3:  PARESTHESIA (ICD-782.0) L foot, poss. Morton's neuroma Assessment: New Dr Charlsie Merles Orders: Podiatry Referral (Podiatry)   Problem # 4:  FOOT  PAIN (ICD-729.5) R, poss capsulitis Assessment: Deteriorated NB open back wide shoes Orders: Podiatry Referral (Podiatry)   Problem # 5:  HYPOTHYROIDISM, UNSPECIFIED (ICD-244.9)  Her updated medication list for this problem includes:    Synthroid 75 Mcg Tabs (Levothyroxine sodium) .Marland Kitchen... Take one tab by mouth once daily   Problem # 6:  COPD (ICD-496)  Problem # 7:  TOBACCO USE DISORDER/SMOKER-SMOKING CESSATION DISCUSSED (ICD-305.1) Assessment: Unchanged Encouraged smoking cessation and discussed different methods for smoking cessation > 10 min.  Her updated medication list for this problem includes:    Chantix Starting Month Pak 0.5 Mg X 11 & 1 Mg X 42 Misc (Varenicline tartrate) .Marland Kitchen... 0.5mg  by mouth once daily for 3 days, then twice daily for 4 days and then 1mg  by mouth 2 times daily    Chantix 1 Mg Tabs (Varenicline tartrate) .Marland Kitchen... 1 tab by mouth 2 times daily  Orders: Tobacco use cessation intensive >10 minutes (21308)   Complete Medication List: 1)  Vytorin 10-40 Mg Tabs (Ezetimibe-simvastatin) .Marland Kitchen.. 1 by mouth qd 2)  Actos 45 Mg Tabs (Pioglitazone hcl) .... Take one tab by mouth once daily 3)  Synthroid 75 Mcg Tabs (Levothyroxine sodium) .... Take one tab by mouth once daily 4)  Glimepiride 1 Mg Tabs (Glimepiride) .Marland Kitchen.. 1po q am 5)  Amitriptyline Hcl 25 Mg Tabs (Amitriptyline hcl) .... 2 by mouth qhs 6)  Estrace 1 Mg Tabs (Estradiol) .Marland Kitchen.. 1 by mouth qd 7)  Vicodin 5-500 Mg Tabs (Hydrocodone-acetaminophen) .Marland Kitchen.. 1 by mouth qid prn 8)  Vitamin D3 1000 Unit Tabs (Cholecalciferol) .... 2 qd 9)  Lamotrigine 25 Mg Tabs (Lamotrigine) .... Two times a day 10)  Zolpidem Tartrate 10 Mg Tabs (Zolpidem tartrate) .... 1/2 or 1 by mouth at hs prn 11)  Chantix Starting Month Pak 0.5 Mg X 11 & 1 Mg X 42 Misc (Varenicline tartrate) .... 0.5mg  by mouth once daily for 3 days, then twice daily for 4 days and then 1mg  by mouth 2 times daily 12)  Chantix 1 Mg Tabs (Varenicline tartrate) .Marland Kitchen.. 1 tab  by mouth 2 times daily  Other Orders: TLB-Udip ONLY (81003-UDIP)   Patient Instructions: 1)  Please schedule a follow-up appointment in 3 months. 2)  Hepatic Panel prior to visit, ICD-9: 3)  Lipid Panel prior to visit, ICD-9: 4)  CBC w/ Diff prior to visit, ICD-9:250.00  995.2 5)  HbgA1C prior to visit, ICD-9:   Prescriptions: ZOLPIDEM TARTRATE 10 MG  TABS (ZOLPIDEM TARTRATE) 1/2 or 1 by mouth at hs prn  #30 x 3   Entered and Authorized by:   Tresa Garter MD   Signed by:   Tresa Garter MD on 04/21/2008   Method used:   Print then Give to Patient   RxID:   [id number] CHANTIX 1 MG TABS (VARENICLINE TARTRATE) 1 tab by mouth 2 times daily  #60 x 3   Entered and Authorized by:   Tresa Garter MD   Signed by:   Tresa Garter MD on 04/21/2008   Method used:   Print then Give to Patient   RxID:   [id number] CHANTIX STARTING MONTH PAK 0.5 MG X 11 & 1 MG X 42  MISC (VARENICLINE TARTRATE) 0.5mg  by mouth once daily for 3 days, then twice daily for 4 days and then 1mg  by mouth 2 times daily  #1 pack x 0   Entered and Authorized by:   Tresa Garter MD   Signed by:   Tresa Garter MD on 04/21/2008   Method used:   Print then Give to Patient   RxID:   [id number] ZOLPIDEM TARTRATE 10 MG  TABS (ZOLPIDEM TARTRATE) 1/2 or 1 by mouth at hs prn  #30 x 3   Entered and Authorized by:   Tresa Garter MD   Signed by:   Tresa Garter MD on 04/21/2008   Method used:   Electron[facility] sent to ...       [facility] [phone][facility]       [address]       [place], [place]  [address]       Ph: [phone] or [phone]       Fax: [phone]   RxID:   [id number]  ]

## 2010-12-02 NOTE — Progress Notes (Signed)
Summary: Neurontin request  Phone Note Call from Patient Call back at Home Phone [phone]   Summary of Call: Patient left message that she forgot to talk to the MD about neurontin. Patient would like to re-start this med at a higher dosage b/c it did not work before. Please advise. Initial call taken by: Lucious Groves,  April 12, 2010 3:23 PM  Follow-up for Phone Call        OK Follow-up by: Tresa Garter MD,  April 12, 2010 5:05 PM  Additional Follow-up for Phone Call Additional follow up Details #1::        Pt informed, refill sent in. Pt wants higher dose than she was on in the past. I advised pt to start as ordered and call back if that does not help for advisement from MD for increase if possible.  Additional Follow-up by: Lamar Sprinkles, CMA,  April 13, 2010 10:30 AM    Prescriptions: GABAPENTIN 100 MG CAPS (GABAPENTIN) 1 by mouth up to qid as needed leg pain and tingling  #120 x 6   Entered by:   Lamar Sprinkles, CMA   Authorized by:   Tresa Garter MD   Signed by:   Lamar Sprinkles, CMA on 04/13/2010   Method used:   [facility]        [facility] [phone][facility] (retail)       [address]       [place], [place]  [address]       Ph: [phone] or [phone]       Fax: [phone]   RxID:   [id number]

## 2010-12-02 NOTE — Letter (Signed)
Summary: CMN for [facility]  CMN for [facility]   Imported By: Sherian Rein 06/15/2009 08:44:18  _____________________________________________________________________  External Attachment:    Type:   Image     Comment:   External Document

## 2010-12-02 NOTE — Letter (Signed)
Summary: L carpal tunnel/[facility]  L carpal tunnel/[facility]   Imported By: Lester [place] 12/11/2008 10:14:10  _____________________________________________________________________  External Attachment:    Type:   Image     Comment:   External Document

## 2010-12-02 NOTE — Progress Notes (Signed)
Summary: Rx request  Phone Note Call from Patient Call back at Home Phone [phone]   Caller: Patient Call For: Tresa Garter MD Reason for Call: Talk to Nurse Summary of Call: Patient came into the office requesting a stronger medication for her hands. The one she is currently taking is not strong enough. Initial call taken by: Irma Newness,  October 05, 2009 9:58 AM  Follow-up for Phone Call        She can take Hydroc/APAP 1.5 or 2 tabs instead of one prn Follow-up by: Tresa Garter MD,  October 06, 2009 12:19 AM  Additional Follow-up for Phone Call Additional follow up Details #1::        Patient notified, and notified that she has been taking 2 tabs. Patient increased to 2 tabs approx. 1 month ago. Please advise. Additional Follow-up by: Lucious Groves,  October 06, 2009 9:36 AM    Additional Follow-up for Phone Call Additional follow up Details #2::    I'll change to 10/325 Follow-up by: Tresa Garter MD,  October 07, 2009 1:30 PM  Additional Follow-up for Phone Call Additional follow up Details #3:: Details for Additional Follow-up Action Taken: EMR updated, original rx was put in as solution, rx called in, left mess to call office back ....................Marland KitchenLamar Sprinkles, CMA  October 07, 2009 4:15 PM   Spoke with pt, she is taking 2 gabapentin daily, informed her that rx says she can take 1 qid as needed, pt is aware and will call back with further problems. Rx cancelled at pharm and called into pharm in [place] .......................Marland KitchenLamar Sprinkles, CMA  October 07, 2009 4:31 PM   New/Updated Medications: HYDROCODONE-ACETAMINOPHEN 10-325 MG/15ML SOLN (HYDROCODONE-ACETAMINOPHEN) 1 by mouth qid as needed pain HYDROCODONE-ACETAMINOPHEN 10-325 MG TABS (HYDROCODONE-ACETAMINOPHEN) 1 by mouth qid as needed pain Prescriptions: HYDROCODONE-ACETAMINOPHEN 10-325 MG TABS (HYDROCODONE-ACETAMINOPHEN) 1 by mouth qid as needed pain  #120 x 2   Entered by:   Lamar Sprinkles, CMA   Authorized by:   Tresa Garter MD   Signed by:   Lamar Sprinkles, CMA on 10/07/2009   Method used:   Telephoned to ...       [facility]  [address] [phone]* (retail)       [address] [place], [place]  [address]       Ph: [phone] or [phone]       Fax: [phone]   RxID:   [id number] HYDROCODONE-ACETAMINOPHEN 10-325 MG TABS (HYDROCODONE-ACETAMINOPHEN) 1 by mouth qid as needed pain  #120 x 2   Entered by:   Lamar Sprinkles, CMA   Authorized by:   Tresa Garter MD   Signed by:   Lamar Sprinkles, CMA on 10/07/2009   Method used:   Telephoned to ...       [facility]  [address] [phone]* (retail)       [address]       [place], [place]  [address]       Ph: [phone] or [phone]       Fax: [phone]   RxID:   [id number] HYDROCODONE-ACETAMINOPHEN 10-325 MG/15ML SOLN (HYDROCODONE-ACETAMINOPHEN) 1 by mouth qid as needed pain  #120 x 2   Entered and Authorized by:   Tresa Garter MD   Signed by:   Lucious Groves on 10/07/2009   Method used:   Print then Give to Patient  RxID:   [id number]

## 2010-12-02 NOTE — Assessment & Plan Note (Signed)
Summary: 3 MTH FU  $50   STC   Vital Signs:  Patient profile:   45 year old female Height:      69 inches Weight:      222 pounds BMI:     32.90 Temp:     97 degrees F oral Pulse rate:   100 / minute BP sitting:   120 / 84  (left arm)  Vitals Entered By: Tora Perches (April 20, 2009 1:30 PM) CC: f/u Is Patient Diabetic? Yes   CC:  f/u.  History of Present Illness: The patient presents for a follow up of hypertension, diabetes, hyperlipidemia. Lost wt!   Current Medications (verified): 1)  Actos 45 Mg  Tabs (Pioglitazone Hcl) .... Take One Tab By Mouth Once Daily 2)  Synthroid 75 Mcg  Tabs (Levothyroxine Sodium) .... Take One Tab By Mouth Once Daily 3)  Glimepiride 1 Mg  Tabs (Glimepiride) .Marland Kitchen.. 1po Q Am 4)  Estrace 1 Mg  Tabs (Estradiol) .Marland Kitchen.. 1 By Mouth Qd 5)  Lamotrigine 25 Mg  Tabs (Lamotrigine) .... Two Times A Day 6)  Zolpidem Tartrate 10 Mg  Tabs (Zolpidem Tartrate) .... 1/2 or 1 By Mouth At [facility] Prn 7)  Meloxicam 15 Mg Tabs (Meloxicam) .... One By Mouth Daily X 2 Wks, Then Prn 8)  Crestor 40 Mg Tabs (Rosuvastatin Calcium) .Marland Kitchen.. 1 Tablet By Mouth Daily 9)  Amitriptyline Hcl 50 Mg Tabs (Amitriptyline Hcl) .... Take 1-2  Tab By Mouth At Bedtime 10)  Hydrocodone-Acetaminophen 5-325 Mg Tabs (Hydrocodone-Acetaminophen) .Marland Kitchen.. 1 By Mouth Up To 4 Times Per Day As Needed For Pain 11)  Spironolactone 50 Mg  Tabs (Spironolactone) .Marland Kitchen.. 1 By Mouth Once Daily in Am 12)  Vitamin B-12 Cr 1000 Mcg  Tbcr (Cyanocobalamin) .... Take One Tablet By Mouth Daily 13)  Vitamin D3 1000 Unit  Tabs (Cholecalciferol) .... 2 Qd  Allergies: 1)  ! Demerol 2)  ! * Niaspan 3)  ! * Lovastatin 4)  ! Paxil 5)  ! * Metformin  Past History:  Past Medical History: Last updated: 01/05/2009 syncope, poss conversion disorder vs psychogenic seizures Diabetes mellitus, type II Hypothyroidism Low back pain Dr Noel Gerold Peripheral neuropathy Gyn Dr Arelia Sneddon COPD vit d  deficient migraine Hyperlipidemia Anxiety Gout (?)  Past Surgical History: Last updated: 01/05/2009 C4-5 anterior cervical fusion Hysterectomy L CTS 2010  Family History: Last updated: 11/13/2007 Family History Lung cance Fr mother with alcoholism  Social History: Last updated: 07/28/2008 Occupation: Married 3d GS due Nov  Alcohol use-no Drug use-no Regular exercise-no Former Smoker 2009  Review of Systems  The patient denies fever, chest pain, dyspnea on exertion, and peripheral edema.         On a diet  Physical Exam  General:  NAD Nose:  External nasal examination shows no deformity or inflammation. Nasal mucosa are pink and moist without lesions or exudates. Mouth:  Oral mucosa and oropharynx without lesions or exudates.  Teeth in good repair. Neck:  No deformities, masses, or tenderness noted. Lungs:  Normal respiratory effort, chest expands symmetrically. Lungs are clear to auscultation, no crackles or wheezes. Heart:  Normal rate and regular rhythm. S1 and S2 normal without gallop, murmur, click, rub or other extra sounds. Abdomen:  Bowel sounds positive,abdomen soft and non-tender without masses, organomegaly or hernias noted. Msk:  L foot dorsum is swollen and tender Extremities:  trace left pedal edema.   Neurologic:  No cranial nerve deficits noted. Station and gait are normal. Plantar reflexes are  down-going bilaterally. DTRs are symmetrical throughout. Sensory, motor and coordinative functions appear intact. Skin:  Intact without suspicious lesions or rashes Psych:  Cognition and judgment appear intact. Alert and cooperative with normal attention span and concentration. No apparent delusions, illusions, hallucinations   Impression & Recommendations:  Problem # 1:  B12 DEFICIENCY (ICD-266.2) Assessment Comment Only On prescription therapy  Problem # 2:  HYPERLIPIDEMIA (ICD-272.4) Assessment: Comment Only  Her updated medication list for this  problem includes:    Crestor 40 Mg Tabs (Rosuvastatin calcium) .Marland Kitchen... 1 tablet by mouth daily  Problem # 3:  GOUT (ICD-274.9)  Orders: TLB-A1C / Hgb A1C (Glycohemoglobin) (83036-A1C) TLB-BMP (Basic Metabolic Panel-BMET) (80048-METABOL) TLB-Hepatic/Liver Function Pnl (80076-HEPATIC) TLB-CBC Platelet - w/Differential (85025-CBCD) TLB-TSH (Thyroid Stimulating Hormone) (84443-TSH)  Problem # 4:  PARESTHESIA (ICD-782.0) Assessment: Improved  Problem # 5:  Wt loss on a diet Assessment: Comment Only  Complete Medication List: 1)  Actos 45 Mg Tabs (Pioglitazone hcl) .... Take one tab by mouth once daily 2)  Synthroid 75 Mcg Tabs (Levothyroxine sodium) .... Take one tab by mouth once daily 3)  Glimepiride 1 Mg Tabs (Glimepiride) .... 1/2 po q am 4)  Estrace 1 Mg Tabs (Estradiol) .Marland Kitchen.. 1 by mouth qd 5)  Lamotrigine 25 Mg Tabs (Lamotrigine) .... Two times a day 6)  Zolpidem Tartrate 10 Mg Tabs (Zolpidem tartrate) .... 1/2 or 1 by mouth at hs prn 7)  Meloxicam 15 Mg Tabs (Meloxicam) .... One by mouth daily x 2 wks, then prn 8)  Crestor 40 Mg Tabs (Rosuvastatin calcium) .Marland Kitchen.. 1 tablet by mouth daily 9)  Amitriptyline Hcl 50 Mg Tabs (Amitriptyline hcl) .... Take 1-2  tab by mouth at bedtime 10)  Hydrocodone-acetaminophen 5-325 Mg Tabs (Hydrocodone-acetaminophen) .Marland Kitchen.. 1 by mouth up to 4 times per day as needed for pain 11)  Spironolactone 50 Mg Tabs (Spironolactone) .Marland Kitchen.. 1 by mouth once daily in am 12)  Vitamin B-12 Cr 1000 Mcg Tbcr (Cyanocobalamin) .... Take one tablet by mouth daily 13)  Vitamin D3 1000 Unit Tabs (Cholecalciferol) .... 2 qd 14)  Darvocet-n 100 100-650 Mg Tabs (Propoxyphene n-apap) .Marland Kitchen.. 1 by mouth 4 times daily as neede for pain  Patient Instructions: 1)  Please schedule a follow-up appointment in 4 months. 2)  BMP prior to visit, ICD-9: 3)  lipids 4)  Hepatic Panel prior to visit, ICD-9: 5)  Lipid Panel prior to visit, ICD-9: 6)  HbgA1C prior to visit, ICD-9:250.00 7)   [email or web address] 8)  Start a yoga class Prescriptions: DARVOCET-N 100 100-650 MG TABS (PROPOXYPHENE N-APAP) 1 by mouth 4 times daily as neede for pain  #30 x 0   Entered and Authorized by:   Tresa Garter MD   Signed by:   Tresa Garter MD on 04/20/2009   Method used:   Print then Give to Patient   RxID:   [id number]

## 2010-12-02 NOTE — Progress Notes (Signed)
  Phone Note Refill Request Message from:  Fax from Pharmacy on November 05, 2010 4:44 PM  Refills Requested: Medication #1:  LAMOTRIGINE 25 MG  TABS two times a day Initial call taken by: Rock Nephew CMA,  November 05, 2010 4:44 PM    Prescriptions: LAMOTRIGINE 25 MG  TABS (LAMOTRIGINE) two times a day  #120 Tablet x 2   Entered by:   Rock Nephew CMA   Authorized by:   Tresa Garter MD   Signed by:   Rock Nephew CMA on 11/05/2010   Method used:   Electronically to        [facility]* (retail)       [address]       [place], [place]  [address]       Ph: [phone]       Fax: [phone]   RxID:   [id number]

## 2010-12-02 NOTE — Letter (Signed)
Summary: F/U Carpal tunnel Syndrome LT Release/[facility]  F/U Carpal tunnel Syndrome LT Release/[facility]   Imported By: Sherian Rein 01/29/2009 08:31:20  _____________________________________________________________________  External Attachment:    Type:   Image     Comment:   External Document

## 2010-12-02 NOTE — Letter (Signed)
Summary: Generic Letter  [facility]  [address] [place], [place] [address]   Phone: [phone]  Fax: [phone]    12/29/2009  DT:OIZTI [facility] [address] [place], [place]  [address]  Ms. Deleonardis has been chronically disabled due to her low back and knee osteoarthritis. She has not been able to sit or stand for a prolonged period of time. She has been depressed and anxious as well. She has been suffering with diabetes with complications.           Sincerely,   Jacinta Shoe MD

## 2010-12-02 NOTE — Progress Notes (Signed)
Summary: LETTER  Phone Note From Other Clinic   Caller: [phone] Summary of Call: Pt needs medical clearance letter to be faxed to above number.  Initial call taken by: Lamar Sprinkles,  May 07, 2009 4:34 PM  Follow-up for Phone Call        DONE Follow-up by: Tresa Garter MD,  May 08, 2009 1:02 PM  Additional Follow-up for Phone Call Additional follow up Details #1::        Faxed letter Additional Follow-up by: Lamar Sprinkles,  May 08, 2009 3:51 PM

## 2010-12-02 NOTE — Miscellaneous (Signed)
Summary: hyydrocodone  Clinical Lists Changes  Medications: Rx of VICODIN 5-500 MG TABS (HYDROCODONE-ACETAMINOPHEN) 1 by mouth qid prn;  #50 x 6;  Signed;  Entered by: Tora Perches;  Authorized by: Tresa Garter MD;  Method used: [facility] [phone]*, [address] [place], [place], [place]  , [place]: [phone] or [phone], Fax: [phone]    Prescriptions: VICODIN 5-500 MG TABS (HYDROCODONE-ACETAMINOPHEN) 1 by mouth qid prn  #50 x 6   Entered by:   Tora Perches   Authorized by:   Tresa Garter MD   Signed by:   Tora Perches on 04/23/2008   Method used:   Faxed to ...       [facility] [address] [id number]*       [address] [place], [place]         Ph: [phone] or [phone]       Fax: [phone]   RxID:   [id number]

## 2010-12-02 NOTE — Letter (Signed)
Summary: Generic Letter  [facility]  [address] [place], [place] [address]   Phone: [phone]  Fax: [phone]    05/08/2009 TO:  [phone] XL:KGMWN [place] [address] [place], [place]  [address]   Ms. Kuehnle is medically clear for her foot surgery.           Sincerely,   Jacinta Shoe MD

## 2010-12-02 NOTE — Letter (Signed)
Summary: OV/[facility]  OV/[facility]   Imported By: Sherian Rein 10/07/2009 07:48:24  _____________________________________________________________________  External Attachment:    Type:   Image     Comment:   External Document

## 2010-12-02 NOTE — Letter (Signed)
Summary: [facility]  [facility]   Imported By: Lennie Odor 11/17/2010 11:55:19  _____________________________________________________________________  External Attachment:    Type:   Image     Comment:   External Document

## 2010-12-02 NOTE — Assessment & Plan Note (Signed)
Summary: 3 MTH FU-STC   Vital Signs:  Patient Profile:   45 Years Old Female Weight:      240 pounds Temp:     97.2 degrees F oral Pulse rate:   100 / minute BP sitting:   126 / 82  (left arm)  Vitals Entered By: Tora Perches (December 10, 2007 2:00 PM)             Is Patient Diabetic? No     Chief Complaint:  Multiple medical problems or concerns.  History of Present Illness: The patient presents for a follow up of hypertension, diabetes, hyperlipidemia. C/o HA's and a neck pain. Finished abx. C/o sinus pressure.   Current Allergies (reviewed today): ! DEMEROL ! * NIASPAN ! * LOVASTATIN ! PAXIL ! * METFORMIN  Past Medical History:    Reviewed history from 11/13/2007 and no changes required:       syncope, poss conversion disorder vs psychogenic seizures       Diabetes mellitus, type II       dyslipidemia       anxiety       Hypothyroidism       Low back pain Dr Noel Gerold       Peripheral neuropathy       Gyn Dr Arelia Sneddon       COPD       vit d deficient       migraine   Family History:    Reviewed history from 11/13/2007 and no changes required:       Family History Lung cance Fr       mother with alcoholism  Social History:    Reviewed history from 08/20/2007 and no changes required:       Occupation:       Married       Current Smoker       Alcohol use-no       Drug use-no       Regular exercise-no    Review of Systems       As above   Physical Exam  General:     Well-developed,well-nourished,in no acute distress; alert,appropriate and cooperative throughout examination Head:     Normocephalic and atraumatic without obvious abnormalities. No apparent alopecia or balding. Ears:     External ear exam shows no significant lesions or deformities.  Otoscopic examination reveals clear canals, tympanic membranes are intact bilaterally without bulging, retraction, inflammation or discharge. Hearing is grossly normal bilaterally. Nose:     Eryth.  mucosa with d/c Mouth:     Oral mucosa and oropharynx without lesions or exudates.  Teeth in good repair. Neck:     No deformities, masses, or tenderness noted. Lungs:     Normal respiratory effort, chest expands symmetrically. Lungs are clear to auscultation, no crackles or wheezes. Heart:     Normal rate and regular rhythm. S1 and S2 normal without gallop, murmur, click, rub or other extra sounds. Abdomen:     Bowel sounds positive,abdomen soft and non-tender without masses, organomegaly or hernias noted. Msk:     Cervical spine is tender to palpation over paraspinal muscles and with the ROM  Neurologic:     alert & oriented X3.   Skin:     turgor normal.   Psych:     memory intact for recent and remote and good eye contact.      Impression & Recommendations:  Problem # 1:  HYPERLIPIDEMIA, OTHER UNSPECIFIED (ICD-272.4) Assessment: Deteriorated Labs  were done in [place] The following medications were removed from the medication list:    Vytorin 10-40 Mg Tabs (Ezetimibe-simvastatin) .Marland Kitchen... Take one tab by mouth once daily  Her updated medication list for this problem includes:    Vytorin 10-40 Mg Tabs (Ezetimibe-simvastatin) .Marland Kitchen... 1 by mouth qd   Problem # 2:  SINUSITIS- ACUTE-NOS (ICD-461.9) Assessment: Deteriorated  The following medications were removed from the medication list:    Ceftin 250 Mg Tabs (Cefuroxime axetil) .Marland Kitchen... 1 by mouth bid    Promethazine-codeine 6.25-10 Mg/46ml Syrp (Promethazine-codeine) .Marland Kitchen... 1 tsp by mouth qid prn  Her updated medication list for this problem includes:    Ceftin 500 Mg Tabs (Cefuroxime axetil) .Marland Kitchen... Take 1 tab by mouth twice a day   Problem # 3:  PERIPHERAL NEUROPATHY (ICD-356.9) Assessment: Improved Increase Amitript. dose at HS  Problem # 4:  DIABETES MELLITUS, TYPE II (ICD-250.00) Assessment: Unchanged On Rx Her updated medication list for this problem includes:    Actos 45 Mg Tabs (Pioglitazone hcl) .Marland Kitchen... Take one  tab by mouth once daily    Glimepiride 1 Mg Tabs (Glimepiride) .Marland Kitchen... 1po q am   Complete Medication List: 1)  Vytorin 10-40 Mg Tabs (Ezetimibe-simvastatin) .Marland Kitchen.. 1 by mouth qd 2)  Actos 45 Mg Tabs (Pioglitazone hcl) .... Take one tab by mouth once daily 3)  Synthroid 75 Mcg Tabs (Levothyroxine sodium) .... Take one tab by mouth once daily 4)  Glimepiride 1 Mg Tabs (Glimepiride) .Marland Kitchen.. 1po q am 5)  Amitriptyline Hcl 25 Mg Tabs (Amitriptyline hcl) .... 2 by mouth qhs 6)  Estrace 1 Mg Tabs (Estradiol) .Marland Kitchen.. 1 by mouth qd 7)  Vicodin 5-500 Mg Tabs (Hydrocodone-acetaminophen) .Marland Kitchen.. 1 by mouth qid prn 8)  Vitamin D3 1000 Unit Tabs (Cholecalciferol) .Marland Kitchen.. 1 qd 9)  Ceftin 500 Mg Tabs (Cefuroxime axetil) .... Take 1 tab by mouth twice a day   Patient Instructions: 1)  Please schedule a follow-up appointment in 4 months. 2)  BMP prior to visit, ICD-9: 3)  TSH prior to visit, ICD-9: 4)  HbgA1C prior to visit, ICD-9: 5)  Vit B12 782.0   250.00 6)  Vit d    Prescriptions: CEFTIN 500 MG TABS (CEFUROXIME AXETIL) Take 1 tab by mouth twice a day  #28 x 1   Entered and Authorized by:   Tresa Garter MD   Signed by:   Tresa Garter MD on 12/10/2007   Method used:   Electronically sent to ...       [facility] [phone]*       [address]       [place], [place]  [address]       Ph: [phone] or [phone]       Fax: [phone]   RxID:   [id number] VYTORIN 10-40 MG TABS (EZETIMIBE-SIMVASTATIN) 1 by mouth qd  #30 x 12   Entered and Authorized by:   Tresa Garter MD   Signed by:   Tresa Garter MD on 12/10/2007   Method used:   Electronically sent to ...       [facility] [phone]*       [address]       [place], [place]  [address]       Ph: [phone] or [phone]       Fax: [phone]   RxID:  [id number]  ] 

## 2010-12-02 NOTE — Progress Notes (Signed)
Summary: med change  Phone Note Call from Patient Call back at Home Phone [phone]   Caller: Patient Summary of Call: Patient called requesting an alternative for Darvocet, she states that vicodin does not help at all. Please advise Initial call taken by: Rock Nephew CMA,  November 03, 2009 4:03 PM  Follow-up for Phone Call        Tramadol Follow-up by: Tresa Garter MD,  November 04, 2009 8:25 AM  Additional Follow-up for Phone Call Additional follow up Details #1::        Patient notified per MD Additional Follow-up by: Rock Nephew CMA,  November 04, 2009 11:03 AM    New/Updated Medications: TRAMADOL HCL 50 MG TABS (TRAMADOL HCL) 1-2 tabs by mouth two times a day as needed pain Prescriptions: TRAMADOL HCL 50 MG TABS (TRAMADOL HCL) 1-2 tabs by mouth two times a day as needed pain  #120 x 3   Entered by:   Rock Nephew CMA   Authorized by:   Tresa Garter MD   Signed by:   Rock Nephew CMA on 11/04/2009   Method used:   Electronically to        [facility] [phone]* (retail)       [address]       [place], [place]  [address]       Ph: [phone] or [phone]       Fax: [phone]   RxID:   [id number]

## 2010-12-02 NOTE — Progress Notes (Signed)
Summary: CPX  Phone Note Call from Patient   Caller: [phone] Summary of Call: Pt says that if she doesn't have a CPX by December 30th her premium will go up for next year. Can we put patient in for CPX by Dec 30th??? Initial call taken by: Lamar Sprinkles,  October 03, 2007 5:11 PM  Follow-up for Phone Call        [facility] Follow-up by: Tresa Garter MD,  October 03, 2007 10:21 PM  Additional Follow-up for Phone Call Additional follow up Details #1::        Patient notified schedule absol full//not CPX avail w/o cx Additional Follow-up by: Rock Nephew CMA,  October 04, 2007 10:42 AM

## 2010-12-02 NOTE — Medication Information (Signed)
Summary: [facility]   Imported By: Lester  12/23/2009 07:28:04  _____________________________________________________________________  External Attachment:    Type:   Image     Comment:   External Document

## 2010-12-02 NOTE — Progress Notes (Signed)
Summary: Rf Vicodin  Phone Note Refill Request Message from:  Fax from Pharmacy  Refills Requested: Medication #1:  HYDROCODONE-ACETAMINOPHEN 10-325 MG TABS 1 by mouth qid as needed pain   Dosage confirmed as above?Dosage Confirmed   Supply Requested: 120   Last Refilled: 05/31/2010  Method Requested: Telephone to Pharmacy Next Appointment Scheduled: 07-19-10 Initial call taken by: Lanier Prude, [facility]),  July 01, 2010 3:18 PM  Follow-up for Phone Call        ok to ref Follow-up by: Tresa Garter MD,  July 02, 2010 12:50 PM  Additional Follow-up for Phone Call Additional follow up Details #1::        Rx called to pharmacy Additional Follow-up by: Lanier Prude, [facility]),  July 02, 2010 3:25 PM    Prescriptions: HYDROCODONE-ACETAMINOPHEN 10-325 MG TABS (HYDROCODONE-ACETAMINOPHEN) 1 by mouth qid as needed pain  #120 x 0   Entered by:   Lanier Prude, CMA(AAMA)   Authorized by:   Tresa Garter MD   Signed by:   Lanier Prude, CMA(AAMA) on 07/02/2010   Method used:   Telephoned to ...       [facility]  [address] [phone]* (retail)       [address] [place], [place]  [address]       Ph: [phone] or [phone]       Fax: [phone]   RxID:   [id number]

## 2010-12-02 NOTE — Letter (Signed)
Summary: [facility]  [facility]   Imported By: Lester Alger 11/10/2008 08:48:40  _____________________________________________________________________  External Attachment:    Type:   Image     Comment:   External Document

## 2010-12-02 NOTE — Assessment & Plan Note (Signed)
Summary: 3 MO ROV /NWS   Vital Signs:  Patient profile:   45 year old female Height:      69 inches Weight:      246 pounds BMI:     36.46 Temp:     97.0 degrees F oral Pulse rate:   84 / minute Pulse rhythm:   regular Resp:     16 per minute BP sitting:   104 / 70  (left arm) Cuff size:   large  Vitals Entered By: Lanier Prude, Beverly Gust) (July 19, 2010 1:38 PM) CC: 3 mo f/u Is Patient Diabetic? Yes Comments pt is requesting gabapentin to be increased.  She states she never had Gabapentin 300mg .   She is not taking Chantix or Ibuprofen   CC:  3 mo f/u.  History of Present Illness: The patient presents for a follow up of hypertension, diabetes, hyperlipidemia C/o pain and swelling  in L foot post-op   Current Medications (verified): 1)  Actos 45 Mg  Tabs (Pioglitazone Hcl) .... Take One Tab By Mouth Once Daily 2)  Synthroid 75 Mcg  Tabs (Levothyroxine Sodium) .... Take One Tab By Mouth Once Daily 3)  Glimepiride 1 Mg  Tabs (Glimepiride) .... 1/2 Po Bid 4)  Estrace 1 Mg  Tabs (Estradiol) .Marland Kitchen.. 1 By Mouth Qd 5)  Lamotrigine 25 Mg  Tabs (Lamotrigine) .... Two Times A Day 6)  Crestor 40 Mg Tabs (Rosuvastatin Calcium) .Marland Kitchen.. 1 Tablet By Mouth Daily 7)  Vitamin B-12 Cr 1000 Mcg  Tbcr (Cyanocobalamin) .... Take One Tablet By Mouth Daily 8)  Vitamin D3 1000 Unit  Tabs (Cholecalciferol) .... 2 Qd 9)  Gabapentin 100 Mg Caps (Gabapentin) .Marland Kitchen.. 1 By Mouth Up To Qid As Needed Leg Pain and Tingling 10)  Chantix Starting Month Pak 0.5 Mg X 11 & 1 Mg X 42 Tabs (Varenicline Tartrate) .... As Dirrected 11)  Chantix Continuing Month Pak 1 Mg Tabs (Varenicline Tartrate) .... As Dirrected 12)  Hydrocodone-Acetaminophen 10-325 Mg Tabs (Hydrocodone-Acetaminophen) .Marland Kitchen.. 1 By Mouth Qid As Needed Pain 13)  Tramadol Hcl 50 Mg Tabs (Tramadol Hcl) .Marland Kitchen.. 1-2 Tabs By Mouth Two Times A Day As Needed Pain 14)  Gabapentin 300 Mg Caps (Gabapentin) .Marland Kitchen.. 1 By Mouth Three Times A Day As Needed 15)  Ibuprofen  600 Mg Tabs (Ibuprofen) .Marland Kitchen.. 1 By Mouth Bid  Pc X 1 Wk Then As Needed For  Pain  Allergies (verified): 1)  ! Demerol 2)  ! * Niaspan 3)  ! * Lovastatin 4)  ! Paxil 5)  ! * Metformin  Past History:  Past Medical History: Last updated: 01/04/2010 syncope, poss conversion disorder vs psychogenic seizures Diabetes mellitus, type II Hypothyroidism Low back pain Dr Noel Gerold Peripheral neuropathy Gyn Dr Arelia Sneddon COPD vit d deficient migraine Hyperlipidemia Anxiety Gout (?) Diet coke overuse - tachycardia L foot tarsal tunnel syndr 2010 Dr Marco Collie in [place]  Social History: Last updated: 01/04/2010 Occupation: Married 3d GS due Nov Diet Coke: 10 cans a day Alcohol use-no Drug use-no Regular exercise-no Former Smoker 2009 restarted 2010 stopped 12/2009  Social History: Reviewed history from 01/04/2010 and no changes required. Occupation: Married 3d GS due Nov Diet Coke: 10 cans a day Alcohol use-no Drug use-no Regular exercise-no Former Smoker 2009 restarted 2010 stopped 12/2009  Review of Systems  The patient denies fever, weight loss, weight gain, and abdominal pain.    Physical Exam  General:  NAD overweight-appearing.   Nose:  External nasal examination shows no deformity or inflammation.  Nasal mucosa are pink and moist without lesions or exudates. Mouth:  Oral mucosa and oropharynx without lesions or exudates.  Teeth in good repair. Neck:  No deformities, masses, or tenderness noted. Lungs:  Normal respiratory effort, chest expands symmetrically. Lungs are clear to auscultation, no crackles or wheezes. Heart:  regular rhythm, no gallop, and tachycardia.   Abdomen:  Bowel sounds positive,abdomen soft and non-tender without masses, organomegaly or hernias noted. Msk:  Lumbar-sacral spine is tender to palpation over paraspinal muscles and painfull with the ROM  Stiff LS back Tender FMS points. L foot is in a surgical  boot Extremities:  No edema Neurologic:  No cranial  nerve deficits noted. Station and gait are normal. Plantar reflexes are down-going bilaterally. DTRs are symmetrical throughout. Sensory, motor and coordinative functions appear intact. Skin:  Clear Psych:  Cognition and judgment appear intact. Alert and cooperative with normal attention span and concentration. No apparent delusions, illusions, hallucinations   Impression & Recommendations:  Problem # 1:  EDEMA (ICD-782.3) Assessment Unchanged Elevate leg  Problem # 2:  FOOT PAIN (ICD-729.5) R post-op Assessment: Unchanged  Problem # 3:  B12 DEFICIENCY (ICD-266.2) Assessment: Unchanged On the regimen of medicine(s) reflected in the chart    Problem # 4:  PERIPHERAL NEUROPATHY (ICD-356.9) Assessment: Unchanged On the regimen of medicine(s) reflected in the chart    Problem # 5:  DIABETES MELLITUS, TYPE II (ICD-250.00) Assessment: Unchanged  Her updated medication list for this problem includes:    Actos 45 Mg Tabs (Pioglitazone hcl) .Marland Kitchen... Take one tab by mouth once daily    Glimepiride 1 Mg Tabs (Glimepiride) .Marland Kitchen... 1/2 po bid  Complete Medication List: 1)  Actos 45 Mg Tabs (Pioglitazone hcl) .... Take one tab by mouth once daily 2)  Synthroid 75 Mcg Tabs (Levothyroxine sodium) .... Take one tab by mouth once daily 3)  Glimepiride 1 Mg Tabs (Glimepiride) .... 1/2 po bid 4)  Estrace 1 Mg Tabs (Estradiol) .Marland Kitchen.. 1 by mouth qd 5)  Lamotrigine 25 Mg Tabs (Lamotrigine) .... Two times a day 6)  Crestor 40 Mg Tabs (Rosuvastatin calcium) .Marland Kitchen.. 1 tablet by mouth daily 7)  Vitamin B-12 Cr 1000 Mcg Tbcr (Cyanocobalamin) .... Take one tablet by mouth daily 8)  Vitamin D3 1000 Unit Tabs (Cholecalciferol) .... 2 qd 9)  Hydrocodone-acetaminophen 10-325 Mg Tabs (Hydrocodone-acetaminophen) .Marland Kitchen.. 1 by mouth qid as needed pain 10)  Tramadol Hcl 50 Mg Tabs (Tramadol hcl) .Marland Kitchen.. 1-2 tabs by mouth two times a day as needed pain 11)  Gabapentin 300 Mg Caps (Gabapentin) .Marland Kitchen.. 1 by mouth three times a day as  needed 12)  Ibuprofen 600 Mg Tabs (Ibuprofen) .Marland Kitchen.. 1 by mouth bid  pc x 1 wk then as needed for  pain 13)  Zolpidem Tartrate 10 Mg Tabs (Zolpidem tartrate) .... 1/2-1 tab at bedtime as needed insomnia  Other Orders: TLB-Uric Acid, Blood (84550-URIC) TLB-Sedimentation Rate (ESR) (85652-ESR) TLB-BMP (Basic Metabolic Panel-BMET) (80048-METABOL) TLB-CBC Platelet - w/Differential (85025-CBCD) TLB-Hepatic/Liver Function Pnl (80076-HEPATIC) TLB-TSH (Thyroid Stimulating Hormone) (84443-TSH)  Contraindications/Deferment of Procedures/Staging:    Test/Procedure: FLU VAX    Reason for deferment: patient declined   Patient Instructions: 1)  Please schedule a follow-up appointment in 3 months. 2)  BMP prior to visit, ICD-9: 3)  Hepatic Panel prior to visit, ICD-9:250.00 4)  vit b12  266.20 5)  Lipid Panel prior to visit, ICD-9: 6)  CBC w/ Diff prior to visit, ICD-9: 7)  HbgA1C prior to visit, ICD-9: Prescriptions: ZOLPIDEM TARTRATE 10 MG  TABS (ZOLPIDEM TARTRATE) 1/2-1 tab at bedtime as needed insomnia  #30 x 2   Entered and Authorized by:   Tresa Garter MD   Signed by:   Tresa Garter MD on 07/19/2010   Method used:   Print then Give to Patient   RxID:   [id number] GABAPENTIN 300 MG CAPS (GABAPENTIN) 1 by mouth three times a day as needed  #90 x 6   Entered and Authorized by:   Tresa Garter MD   Signed by:   Tresa Garter MD on 07/19/2010   Method used:   Print then Give to Patient   RxID:   [id number]

## 2010-12-02 NOTE — Letter (Signed)
[facility]: [facility]   Imported By: Lester Luther 01/20/2009 11:59:40  _____________________________________________________________________  External Attachment:    Type:   Image     Comment:   External Document

## 2010-12-02 NOTE — Assessment & Plan Note (Signed)
Summary: 3 MO ROV /NWS  #   Vital Signs:  Patient profile:   45 year old female Height:      69 inches Weight:      243 pounds BMI:     36.01 Temp:     98.9 degrees F oral Pulse rate:   88 / minute Pulse rhythm:   regular Resp:     16 per minute BP sitting:   96 / 62  (left arm) Cuff size:   large  Vitals Entered By: Lanier Prude, Beverly Gust) (October 18, 2010 2:44 PM) CC: 3 mo f/u  Is Patient Diabetic? Yes Comments pt was recently evaluated and treated at [facility] in [place] for infection in her small bowel. She was given Cipro 500mg  two times a day, metronidazole 500mg  two times a day, Percocet and dilaudid but is unsure of the mg.   CC:  3 mo f/u .  History of Present Illness: The patient presents for a follow up of diabetes, hyperlipidemia  C/o recent gastroenteritis last wk - went to ER twice - on Flagyl now  Current Medications (verified): 1)  Actos 45 Mg  Tabs (Pioglitazone Hcl) .... Take One Tab By Mouth Once Daily 2)  Synthroid 75 Mcg  Tabs (Levothyroxine Sodium) .... Take One Tab By Mouth Once Daily 3)  Glimepiride 1 Mg  Tabs (Glimepiride) .... 1/2 Po Bid 4)  Estrace 1 Mg  Tabs (Estradiol) .Marland Kitchen.. 1 By Mouth Qd 5)  Lamotrigine 25 Mg  Tabs (Lamotrigine) .... Two Times A Day 6)  Crestor 40 Mg Tabs (Rosuvastatin Calcium) .Marland Kitchen.. 1 Tablet By Mouth Daily 7)  Vitamin B-12 Cr 1000 Mcg  Tbcr (Cyanocobalamin) .... Take One Tablet By Mouth Daily 8)  Vitamin D3 1000 Unit  Tabs (Cholecalciferol) .... 2 Qd 9)  Hydrocodone-Acetaminophen 10-325 Mg Tabs (Hydrocodone-Acetaminophen) .Marland Kitchen.. 1 By Mouth Qid As Needed Pain 10)  Gabapentin 300 Mg Caps (Gabapentin) .Marland Kitchen.. 1 By Mouth Three Times A Day As Needed 11)  Ibuprofen 600 Mg Tabs (Ibuprofen) .Marland Kitchen.. 1 By Mouth Bid  Pc X 1 Wk Then As Needed For  Pain 12)  Zolpidem Tartrate 10 Mg Tabs (Zolpidem Tartrate) .... 1/2-1 Tab At Bedtime As Needed Insomnia  Allergies (verified): 1)  ! Demerol 2)  ! * Niaspan 3)  ! * Lovastatin 4)  ! Paxil 5)   ! * Metformin  Past History:  Past Medical History: Last updated: 01/04/2010 syncope, poss conversion disorder vs psychogenic seizures Diabetes mellitus, type II Hypothyroidism Low back pain Dr Noel Gerold Peripheral neuropathy Gyn Dr Arelia Sneddon COPD vit d deficient migraine Hyperlipidemia Anxiety Gout (?) Diet coke overuse - tachycardia L foot tarsal tunnel syndr 2010 Dr Marco Collie in [place]  Social History: Last updated: 01/04/2010 Occupation: Married 3d GS due Nov Diet Coke: 10 cans a day Alcohol use-no Drug use-no Regular exercise-no Former Smoker 2009 restarted 2010 stopped 12/2009  Physical Exam  General:  NAD overweight-appearing.   Nose:  External nasal examination shows no deformity or inflammation. Nasal mucosa are pink and moist without lesions or exudates. Mouth:  Oral mucosa and oropharynx without lesions or exudates.  Teeth in good repair. Neck:  No deformities, masses, or tenderness noted. Lungs:  Normal respiratory effort, chest expands symmetrically. Lungs are clear to auscultation, no crackles or wheezes. Heart:  regular rhythm, no gallop, and tachycardia.   Abdomen:  Bowel sounds positive,abdomen soft and non-tender without masses, organomegaly or hernias noted. Msk:  Lumbar-sacral spine is tender to palpation over paraspinal muscles and painfull with  the ROM  Stiff LS back Tender FMS points. L foot is in a surgical  boot Extremities:  trace left pedal edema.   Neurologic:  No cranial nerve deficits noted. Station and gait are normal. Plantar reflexes are down-going bilaterally. DTRs are symmetrical throughout. Sensory, motor and coordinative functions appear intact. Skin:  Clear Psych:  Cognition and judgment appear intact. Alert and cooperative with normal attention span and concentration. No apparent delusions, illusions, hallucinations   Impression & Recommendations:  Problem # 1:  GASTROENTERITIS (ICD-558.9) Assessment Improved On On the regimen of  medicine(s) reflected in the chart    Problem # 2:  DIABETES MELLITUS, TYPE II (ICD-250.00) Assessment: Unchanged  Her updated medication list for this problem includes:    Actos 45 Mg Tabs (Pioglitazone hcl) .Marland Kitchen... Take one tab by mouth once daily    Glimepiride 1 Mg Tabs (Glimepiride) .Marland Kitchen... 1/2 po bid  Orders: TLB-A1C / Hgb A1C (Glycohemoglobin) (83036-A1C) TLB-B12, Serum-Total ONLY (16109-U04) TLB-BMP (Basic Metabolic Panel-BMET) (80048-METABOL) TLB-CBC Platelet - w/Differential (85025-CBCD)  Problem # 3:  B12 DEFICIENCY (ICD-266.2) Assessment: Unchanged On the regimen of medicine(s) reflected in the chart    Problem # 4:  HYPERLIPIDEMIA (ICD-272.4) Assessment: Unchanged  Her updated medication list for this problem includes:    Crestor 40 Mg Tabs (Rosuvastatin calcium) .Marland Kitchen... 1 tablet by mouth daily  Complete Medication List: 1)  Actos 45 Mg Tabs (Pioglitazone hcl) .... Take one tab by mouth once daily 2)  Synthroid 75 Mcg Tabs (Levothyroxine sodium) .... Take one tab by mouth once daily 3)  Glimepiride 1 Mg Tabs (Glimepiride) .... 1/2 po bid 4)  Estrace 1 Mg Tabs (Estradiol) .Marland Kitchen.. 1 by mouth qd 5)  Lamotrigine 25 Mg Tabs (Lamotrigine) .... Two times a day 6)  Crestor 40 Mg Tabs (Rosuvastatin calcium) .Marland Kitchen.. 1 tablet by mouth daily 7)  Vitamin B-12 Cr 1000 Mcg Tbcr (Cyanocobalamin) .... Take one tablet by mouth daily 8)  Vitamin D3 1000 Unit Tabs (Cholecalciferol) .... 2 qd 9)  Hydrocodone-acetaminophen 10-325 Mg Tabs (Hydrocodone-acetaminophen) .Marland Kitchen.. 1 by mouth qid as needed pain 10)  Gabapentin 300 Mg Caps (Gabapentin) .Marland Kitchen.. 1 by mouth three times a day as needed 11)  Ibuprofen 600 Mg Tabs (Ibuprofen) .Marland Kitchen.. 1 by mouth bid  pc x 1 wk then as needed for  pain 12)  Zolpidem Tartrate 10 Mg Tabs (Zolpidem tartrate) .... 1/2-1 tab at bedtime as needed insomnia  Patient Instructions: 1)  Please schedule a follow-up appointment in 3 months. 2)  BMP prior to visit, ICD-9:250.00 3)   HbgA1C prior to visit, ICD-9:   Orders Added: 1)  Est. Patient Level IV [54098] 2)  TLB-A1C / Hgb A1C (Glycohemoglobin) [83036-A1C] 3)  TLB-B12, Serum-Total ONLY [82607-B12] 4)  TLB-BMP (Basic Metabolic Panel-BMET) [80048-METABOL] 5)  TLB-CBC Platelet - w/Differential [85025-CBCD]

## 2010-12-02 NOTE — Medication Information (Signed)
Summary: [facility]  [facility]   Imported By: Esmeralda Links D'jimraou 12/03/2007 13:30:05  _____________________________________________________________________  External Attachment:    Type:   Image     Comment:   External Document

## 2010-12-02 NOTE — Miscellaneous (Signed)
  Clinical Lists Changes  Medications: Removed medication of * ASA 325 Take one tab by mouth once daily - Signed Removed medication of * FLEXERIL as needed - Signed Removed medication of PROAMATINE 5 MG  TABS (MIDODRINE HCL) 1-2 bid - Signed Removed medication of AMLODIPINE BESYLATE 10 MG  TABS (AMLODIPINE BESYLATE) 1-2 once daily - Signed

## 2010-12-08 NOTE — Procedures (Signed)
Summary: Colonoscopy/[facility]  Colonoscopy/[facility]   Imported By: Sherian Rein 12/02/2010 10:33:18  _____________________________________________________________________  External Attachment:    Type:   Image     Comment:   External Document

## 2010-12-24 ENCOUNTER — Telehealth: Payer: Self-pay | Admitting: Internal Medicine

## 2010-12-27 ENCOUNTER — Encounter: Payer: Self-pay | Admitting: Internal Medicine

## 2010-12-27 ENCOUNTER — Telehealth: Payer: Self-pay | Admitting: Internal Medicine

## 2010-12-28 NOTE — Progress Notes (Signed)
Summary: Rx request  Phone Note From Pharmacy   Caller: [facility] [phone] Call For: Hydrocodone-APAP 10-325  Reason for Call: Needs renewal Summary of Call: Pharmacy faxed request for Rx refill authorization  Sig: 1 tablet by mouth QID as needed for pain #120x0 [facility] [phone]   Initial call taken by: Burnard Leigh [facility]),  December 24, 2010 12:04 PM  Follow-up for Phone Call        ok x1 Follow-up by: Tresa Garter MD,  December 24, 2010 6:05 PM    Prescriptions: HYDROCODONE-ACETAMINOPHEN 10-325 MG TABS (HYDROCODONE-ACETAMINOPHEN) 1 by mouth qid as needed pain  #120 x 0   Entered by:   Lamar Sprinkles, CMA   Authorized by:   Tresa Garter MD   Signed by:   Lamar Sprinkles, CMA on 12/24/2010   Method used:   Telephoned to ...       [facility]* (retail)       [address]       [place], [place]  [address]       Ph: [phone]       Fax: [phone]   RxID:   [id number]

## 2011-01-06 NOTE — Progress Notes (Signed)
Summary: REFILL?   Phone Note Call from Patient Call back at [facility] Phone [phone]   Summary of Call: Patient is requesting refill of tramadol. I see this was removed. Please advise.  Initial call taken by: Lamar Sprinkles, CMA,  December 27, 2010 12:43 PM  Follow-up for Phone Call        ok Follow-up by: Tresa Garter MD,  December 27, 2010 6:39 PM    New/Updated Medications: TRAMADOL HCL 50 MG TABS (TRAMADOL HCL) 1-2 tabs by mouth two times a day as needed pain Prescriptions: TRAMADOL HCL 50 MG TABS (TRAMADOL HCL) 1-2 tabs by mouth two times a day as needed pain  #120 x 3   Entered and Authorized by:   Tresa Garter MD   Signed by:   Lamar Sprinkles, CMA on 12/27/2010   Method used:   Electronically to        [facility] [phone]* (retail)       [address]       [place], [place]  [address]       Ph: [phone] or [phone]       Fax: [phone]   RxID:   [id number]   Appended Document: REFILL?  spoke to Katie at above listed pharm and advised her to put med back. Will send Rf to [facility] pharmacy because we have received multiple requests from there.

## 2011-01-06 NOTE — Letter (Signed)
Summary: [facility]  [facility]   Imported By: Sherian Rein 12/29/2010 11:51:04  _____________________________________________________________________  External Attachment:    Type:   Image     Comment:   External Document

## 2011-01-10 ENCOUNTER — Other Ambulatory Visit: Payer: Self-pay

## 2011-01-17 ENCOUNTER — Ambulatory Visit (INDEPENDENT_AMBULATORY_CARE_PROVIDER_SITE_OTHER): Payer: Managed Care, Other (non HMO) | Admitting: Internal Medicine

## 2011-01-17 ENCOUNTER — Encounter: Payer: Self-pay | Admitting: Internal Medicine

## 2011-01-17 DIAGNOSIS — M2550 Pain in unspecified joint: Secondary | ICD-10-CM

## 2011-01-17 DIAGNOSIS — E119 Type 2 diabetes mellitus without complications: Secondary | ICD-10-CM

## 2011-01-17 DIAGNOSIS — M25519 Pain in unspecified shoulder: Secondary | ICD-10-CM | POA: Insufficient documentation

## 2011-01-17 DIAGNOSIS — E039 Hypothyroidism, unspecified: Secondary | ICD-10-CM

## 2011-01-17 DIAGNOSIS — R609 Edema, unspecified: Secondary | ICD-10-CM

## 2011-01-17 NOTE — Progress Notes (Unsigned)
  Subjective:    Patient ID: Katherine Herrera, female    DOB: Dec 10, 1965, 45 y.o.   MRN: [id number]  HPI    Review of Systems     Objective:   Physical Exam        Assessment & Plan:

## 2011-01-27 NOTE — Assessment & Plan Note (Signed)
Vital Signs:  Patient profile:   45 year old female Height:      69 inches Weight:      242 pounds BMI:     35.87 Temp:     97.8 degrees F oral Pulse rate:   88 / minute Pulse rhythm:   regular Resp:     16 per minute BP sitting:   104 / 60  (left arm) Cuff size:   regular  Vitals Entered By: Lanier Prude, CMA(AAMA) (January 17, 2011 3:01 PM) CC: f/u c/o shooting pains in Lt arm X 3-4 days and cough when lying down Is Patient Diabetic? No   CC:  f/u c/o shooting pains in Lt arm X 3-4 days and cough when lying down.  History of Present Illness: The patient presents for a follow up of hypertension, diabetes, hyperlipidemia C/o sinus congestion, cough and allergies C/o R arm shooting pains neck and down, dull; worse w/neck turning (?) triggered by nothing  Current Medications (verified): 1)  Actos 45 Mg  Tabs (Pioglitazone Hcl) .... Take One Tab By Mouth Once Daily 2)  Synthroid 75 Mcg  Tabs (Levothyroxine Sodium) .... Take One Tab By Mouth Once Daily 3)  Glimepiride 1 Mg  Tabs (Glimepiride) .... 1/2 Po Bid 4)  Estrace 1 Mg  Tabs (Estradiol) .Marland Kitchen.. 1 By Mouth Qd 5)  Lamotrigine 25 Mg  Tabs (Lamotrigine) .... Two Times A Day 6)  Crestor 40 Mg Tabs (Rosuvastatin Calcium) .Marland Kitchen.. 1 Tablet By Mouth Daily 7)  Vitamin B-12 Cr 1000 Mcg  Tbcr (Cyanocobalamin) .... Take One Tablet By Mouth Daily 8)  Vitamin D3 1000 Unit  Tabs (Cholecalciferol) .... 2 Qd 9)  Gabapentin 300 Mg Caps (Gabapentin) .Marland Kitchen.. 1 By Mouth Three Times A Day As Needed 10)  Ibuprofen 600 Mg Tabs (Ibuprofen) .Marland Kitchen.. 1 By Mouth Bid  Pc X 1 Wk Then As Needed For  Pain 11)  Zolpidem Tartrate 10 Mg Tabs (Zolpidem Tartrate) .... 1/2-1 Tab At Bedtime As Needed Insomnia 12)  Tramadol Hcl 50 Mg Tabs (Tramadol Hcl) .Marland Kitchen.. 1-2 Tabs By Mouth Two Times A Day As Needed Pain  Allergies (verified): 1)  ! Demerol 2)  ! * Niaspan 3)  ! * Lovastatin 4)  ! Paxil 5)  ! * Metformin  Past History:  Past Medical History: Last updated:  01/04/2010 syncope, poss conversion disorder vs psychogenic seizures Diabetes mellitus, type II Hypothyroidism Low back pain Dr Noel Gerold Peripheral neuropathy Gyn Dr Arelia Sneddon COPD vit d deficient migraine Hyperlipidemia Anxiety Gout (?) Diet coke overuse - tachycardia L foot tarsal tunnel syndr 2010 Dr Marco Collie in [place]  Social History: Last updated: 01/04/2010 Occupation: Married 3d GS due Nov Diet Coke: 10 cans a day Alcohol use-no Drug use-no Regular exercise-no Former Smoker 2009 restarted 2010 stopped 12/2009  Review of Systems  The patient denies fever, vision loss, chest pain, dyspnea on exertion, abdominal pain, and hematochezia.    Physical Exam  General:  NAD overweight-appearing.   Ears:  External ear exam shows no significant lesions or deformities.  Otoscopic examination reveals clear canals, tympanic membranes are intact bilaterally without bulging, retraction, inflammation or discharge. Hearing is grossly normal bilaterally. Nose:  External nasal examination shows no deformity or inflammation. Nasal mucosa are pink and moist without lesions or exudates. Mouth:  Oral mucosa and oropharynx without lesions or exudates.  Teeth in good repair. Neck:  No deformities, masses, or tenderness noted. Heart:  regular rhythm, no gallop, and tachycardia.   Abdomen:  Bowel sounds  positive,abdomen soft and non-tender without masses, organomegaly or hernias noted. Msk:  R subacr is tender w/palp and ROM B traps and deltoids are ok Neck is ok Extremities:  trace left pedal edema.   Neurologic:  No cranial nerve deficits noted. Station and gait are normal. Plantar reflexes are down-going bilaterally. DTRs are symmetrical throughout. Sensory, motor and coordinative functions appear intact. Skin:  Clear Psych:  Cognition and judgment appear intact. Alert and cooperative with normal attention span and concentration. No apparent delusions, illusions, hallucinations   Impression &  Recommendations:  Problem # 1:  B12 DEFICIENCY (ICD-266.2) Assessment Improved On the regimen of medicine(s) reflected in the chart    Problem # 2:  SHOULDER PAIN (ICD-719.41) R subacr Assessment: New Inject if she wishes/ ifneeded Stretching exercises Her updated medication list for this problem includes:    Ibuprofen 600 Mg Tabs (Ibuprofen) .Marland Kitchen... 1 by mouth bid  pc x 1 wk then as needed for  pain    Tramadol Hcl 50 Mg Tabs (Tramadol hcl) .Marland Kitchen... 1-2 tabs by mouth two times a day as needed pain  Problem # 3:  DIABETES MELLITUS, TYPE II (ICD-250.00) Assessment: Unchanged  Her updated medication list for this problem includes:    Actos 45 Mg Tabs (Pioglitazone hcl) .Marland Kitchen... Take one tab by mouth once daily    Glimepiride 1 Mg Tabs (Glimepiride) .Marland Kitchen... 1/2 po bid  Problem # 4:  HYPERLIPIDEMIA (ICD-272.4) Assessment: Unchanged  Her updated medication list for this problem includes:    Crestor 40 Mg Tabs (Rosuvastatin calcium) .Marland Kitchen... 1 tablet by mouth daily  Labs Reviewed: SGOT: 15 (07/19/2010)   SGPT: 15 (07/19/2010)   HDL:30.20 (09/29/2009), 32.60 (09/07/2009)  LDL:DEL (12/29/2008), DEL (07/21/2008)  Chol:137 (09/29/2009), 151 (09/07/2009)  Trig:221.0 (09/29/2009), 295.0 (09/07/2009)  Complete Medication List: 1)  Actos 45 Mg Tabs (Pioglitazone hcl) .... Take one tab by mouth once daily 2)  Synthroid 75 Mcg Tabs (Levothyroxine sodium) .... Take one tab by mouth once daily 3)  Glimepiride 1 Mg Tabs (Glimepiride) .... 1/2 po bid 4)  Estrace 1 Mg Tabs (Estradiol) .Marland Kitchen.. 1 by mouth qd 5)  Lamotrigine 25 Mg Tabs (Lamotrigine) .... Two times a day 6)  Crestor 40 Mg Tabs (Rosuvastatin calcium) .Marland Kitchen.. 1 tablet by mouth daily 7)  Vitamin B-12 Cr 1000 Mcg Tbcr (Cyanocobalamin) .... Take one tablet by mouth daily 8)  Vitamin D3 1000 Unit Tabs (Cholecalciferol) .... 2 qd 9)  Gabapentin 300 Mg Caps (Gabapentin) .Marland Kitchen.. 1 by mouth three times a day as needed 10)  Ibuprofen 600 Mg Tabs (Ibuprofen) .Marland Kitchen.. 1  by mouth bid  pc x 1 wk then as needed for  pain 11)  Zolpidem Tartrate 10 Mg Tabs (Zolpidem tartrate) .... 1/2-1 tab at bedtime as needed insomnia 12)  Tramadol Hcl 50 Mg Tabs (Tramadol hcl) .Marland Kitchen.. 1-2 tabs by mouth two times a day as needed pain  Patient Instructions: 1)  Please schedule a follow-up appointment in 3 months well w/labs. 2)  HbgA1C prior to visit, ICD-9:250.00 3)  uric acid   995.20 4)  Vit B12  266.20   Orders Added: 1)  Est. Patient Level IV [16109]

## 2011-02-02 LAB — BASIC METABOLIC PANEL
BUN: 9 mg/dL (ref 6–23)
CO2: 31 mEq/L (ref 19–32)
Calcium: 8.9 mg/dL (ref 8.4–10.5)
Chloride: 103 mEq/L (ref 96–112)
Creatinine, Ser: 0.85 mg/dL (ref 0.4–1.2)
GFR calc Af Amer: >60 mL/min (ref >60)
GFR calc non Af Amer: >60 mL/min (ref >60)
Glucose, Bld: 111 mg/dL — ABNORMAL HIGH (ref 70–99)
Potassium: 4.8 mEq/L (ref 3.5–5.1)
Sodium: 140 mEq/L (ref 135–145)

## 2011-02-02 LAB — POCT HEMOGLOBIN-HEMACUE: Hemoglobin: 16.1 g/dL — ABNORMAL HIGH (ref 12.0–15.0)

## 2011-02-02 LAB — GLUCOSE, CAPILLARY
Glucose-Capillary: 138 mg/dL — ABNORMAL HIGH (ref 70–99)
Glucose-Capillary: 86 mg/dL (ref 70–99)

## 2011-02-07 ENCOUNTER — Telehealth: Payer: Self-pay | Admitting: *Deleted

## 2011-02-07 NOTE — Telephone Encounter (Signed)
rec rf req for Zolpidem 10mg  1/2-1 po qhs prn  and Hydroco/apap 10-325 1 po qid #120... Ok to [employer] [facility]?

## 2011-02-08 MED ORDER — HYDROCODONE-ACETAMINOPHEN 10-325 MG PO TABS: 1.0000 | ORAL_TABLET | Freq: Four times a day (QID) | ORAL | Status: DC | PRN

## 2011-02-08 MED ORDER — ZOLPIDEM TARTRATE 10 MG PO TABS: 5.0000 mg | ORAL_TABLET | Freq: Every evening | ORAL | Status: DC | PRN

## 2011-02-08 NOTE — Telephone Encounter (Signed)
rf phoned in 

## 2011-02-08 NOTE — Telephone Encounter (Signed)
OK to fill this prescription with additional refills x2 Thank you!  

## 2011-02-15 LAB — GLUCOSE, CAPILLARY
Glucose-Capillary: 175 mg/dL — ABNORMAL HIGH (ref 70–99)
Glucose-Capillary: 195 mg/dL — ABNORMAL HIGH (ref 70–99)

## 2011-02-15 LAB — BASIC METABOLIC PANEL
BUN: 10 mg/dL (ref 6–23)
CO2: 29 mEq/L (ref 19–32)
Calcium: 9.1 mg/dL (ref 8.4–10.5)
Chloride: 103 mEq/L (ref 96–112)
Creatinine, Ser: 0.69 mg/dL (ref 0.4–1.2)
GFR calc Af Amer: >60 mL/min (ref >60)
GFR calc non Af Amer: >60 mL/min (ref >60)
Glucose, Bld: 209 mg/dL — ABNORMAL HIGH (ref 70–99)
Potassium: 4.5 mEq/L (ref 3.5–5.1)
Sodium: 138 mEq/L (ref 135–145)

## 2011-02-15 LAB — POCT HEMOGLOBIN-HEMACUE: Hemoglobin: 15 g/dL (ref 12.0–15.0)

## 2011-03-15 NOTE — Op Note (Signed)
Katherine Herrera, Katherine Herrera               ACCOUNT NO.:  [id number]   MEDICAL RECORD NO.:  [id number]          PATIENT TYPE:  AMB   LOCATION:  DSC                          FACILITY:  [facility]   PHYSICIAN:  Cindee Salt, M.D.       DATE OF BIRTH:  November 15, 1965   DATE OF PROCEDURE:  12/25/2008  DATE OF DISCHARGE:                               OPERATIVE REPORT   PREOPERATIVE DIAGNOSIS:  Carpal tunnel syndrome left hand.   POSTOPERATIVE DIAGNOSIS:  Carpal tunnel syndrome left hand.   OPERATION:  Decompression of the left median nerve.   SURGEON:  Cindee Salt, MD   ASSISTANT:  Joaquin Courts, RN   ANESTHESIA:  Forearm based IV regional.   ANESTHESIOLOGIST:  W. Autumn Patty, MD.   HISTORY:  The patient is a 45 year old female with a history of carpal  tunnel syndrome, EMG nerve conduction is positive.  This has not  responded to conservative treatment.  She has elected to undergo  surgical decompression.  Pre, peri and postoperative course were  discussed along with risks and complications.  She is aware that there  is no guarantee with the surgery, possibility of infection, recurrence  injury to arteries, nerves, tendons, incomplete relief of symptoms and  dystrophy.  In the preoperative area, the patient is seen.  The  extremity marked by both the patient and surgeon.  Antibiotic given.   PROCEDURE:  The patient was brought to the operating room where a  forearm based IV regional anesthetic was carried out without difficulty.  She was prepped using DuraPrep, supine position, left arm free.  A time-  out was taken confirming the patient and procedure.  A longitudinal  incision was made in the palm and carried down into subcutaneous tissue.  She had had some feeling.  This was then infiltrated with 0.25% Marcaine  without epinephrine.  The palmar fascia was split and superficial palmar  arch was identified.  The flexor tendon to the ring and little finger  identified.  To the ulnar side of the  median nerve, the carpal  retinaculum was incised with sharp dissection.  A right-angle and Sewall  retractor were placed between the skin and forearm fascia.  The fascia  was released for approximately a centimeter and half proximal to the  wrist crease under direct vision.  Canal was explored.  No further  lesions were identified.  Area compression to the nerve was apparent.  The wound was irrigated with saline and Marcaine.  The skin was then  closed with interrupted 4-0 Vicryl Rapide sutures.  A sterile  compressive dressing and splint to the wrist was applied.  The patient  tolerated the procedure well and was taken to the recovery room for  observation in satisfactory condition.  She will be discharged home to  return to the [facility] in 1 week on Vicodin.           ______________________________  Cindee Salt, M.D.     GK/MEDQ  D:  12/25/2008  T:  12/25/2008  Job:  [id number]   cc:   Georgina Quint. Plotnikov,  MD

## 2011-03-18 NOTE — Discharge Summary (Signed)
[place]. [facility]  Patient:    Katherine Herrera, Katherine Herrera Visit Number: [id number] MRN: [id number]          Service Type: MED Location: [phone] Attending Physician:  Dorena Cookey Dictated by:   Cornell Barman, P.A. Admit Date:  01/10/2002 Discharge Date: 01/14/2002   CC:         Sonda Primes, M.D. [facility]   Discharge Summary  DISCHARGE DIAGNOSES: 1. Atypical chest pain. 2. Vasovagal syncope. 3. Viral labyrinthitis. 4. Hypothyroid. 5. Hyperglycemia. 6. Migraine headache.  HISTORY OF PRESENT ILLNESS:  Ms. Starkel is a 45 year old white female who presented with chest pain and questionable history of syncope.  She describes the onset of sharp chest pain around 11:30 a.m. while typing at work.  She got up to go to the bathroom.  As she was walking back from the bathroom, she felt presyncopal, noting a sensation of disorientation, diaphoresis, and lightheadedness.  She walked back to her desk and sat down.  She was told by a co-worker she looked pale.  She does recall EMS being called, but she does not recall the time interval between calling EMS and talking with EMS while lying on a stretcher.  There was no recorded seizure activity or definite loss of consciousness noted by co-workers.  Husband did arrive prior to the paramedics.  He stated she was incoherent.  Reportedly when EMS arrived, her systolic blood pressure was 80.  Her CBG was 82.  On arrival to the emergency department she does feel better.  She describes chest wall pain to be similar to the pain she had nine months ago.  She denies any prior history of syncope or presyncope.  She denies any seizure disorder or recent head trauma.  PAST MEDICAL HISTORY: 1. Hypercholesterolemia. 2. Borderline diabetes mellitus. 3. Status post TAH-BSO. 4. Abdominal scar tissue and adhesions. 5. Remote history of skin graft to the right foot. 6. Status post right inguinal hernia repair.  HOSPITAL  COURSE:  #1 -  ATYPICAL CHEST PAIN:  The patient has minimal cardiac risk factors except for hypercholesterolemia.  Her cardiac enzymes were negative for myocardial infarction.  EKG was without ischemia.  The patient had had a stress Cardiolite in July 2002, which was negative for ischemia.  On this admission the patient had a CT of the chest with bilateral lower extremities that ruled out PE and DVT.  We suspected chest wall inflammation and started her on some Toradol.  The patient did have some relief with Toradol but not complete.  Therefore, we did give her one dose of Solu-Medrol and started her on Vioxx.  The patients chest wall pain has subsided.  #2 -  NEAR-SYNCOPE:  This appears to be vasovagal.  For thoroughness, however, we did ask for neurology to see the patient.  Dr. Anne Hahn saw the patient and agreed that this was likely vasovagal syncope.  He noted nystagmus and gait instability and felt this may be vestibular in etiology.  He recommended meclizine as needed for dizziness.  The patients dizziness has improved with meclizine.  #3 -  HEADACHE:  The patient describes many "sinus headaches."  On further history per Dr. Alwyn Ren, it does sound more like migraines.  He did use Imitrex with the patient, and she did describe relief of her headache with Imitrex. The patient would like a prescription for Imitrex at discharge.  Because of persistent headache before the Imitrex, neurology recommended an MRI, results of which are still pending at  the time of dictation but if it is normal, she will be discharged home.  #4 -  ELEVATED THYROID STIMULATING HORMONE WITH A NORMAL FREE T4:  This does go along with the fact she has had difficulty losing weight.  We will initiate the patient on a low dose of Synthroid.  #5 -  DYSLIPIDEMIA:  The patient is followed at the lipid clinic.  She currently takes omega-3 fatty acid, it looks like a multivitamin, and Niaspan at home.  Her lipid profile  in the hospital revealed a total cholesterol of 132, her triglycerides were elevated at 236, her HDL was low at 31, and LDL was normal at 48.  #6 -  HYPERGLYCEMIA:  The patient describes borderline diabetes.  Her blood sugars were mildly elevated in the 140s.  Hemoglobin a1C was normal at 5.7, and microalbumin was 0.5.  The patient may need further outpatient nutrition counseling regarding a no concentrated sweets diet.  DISCHARGE LABORATORY DATA:  TSH was elevated at 12.054, free T4 was 1.18. Microalbumin was 0.5 mg/dl.  Hemoglobin 13.  Hemoglobin a1C was 5.7.  Urine pregnancy was negative.  DISCHARGE MEDICATIONS:  She is to resume her home medications, including Premarin 0.9 mg q.d., omega-3 fatty acid, Niaspan, meclizine 12.5 mg q.4-6h. p.r.n., Imitrex 25 mg p.r.n. headache, Synthroid 50 mcg q.d., Vioxx 25 mg for four additional days, Protonix 40 mg for a 30-day trial.  FOLLOW-UP:  We have asked the patient to follow up with Dr. Posey Rea in the next week. Dictated by:   Cornell Barman, P.A. Attending Physician:  Dorena Cookey DD:  01/14/02 TD:  01/15/02 Job: [id number]

## 2011-03-18 NOTE — Op Note (Signed)
NAME:  Katherine Herrera, Katherine Herrera                         ACCOUNT NO.:  [id number]   MEDICAL RECORD NO.:  [id number]                   PATIENT TYPE:  OIB   LOCATION:  3005                                 FACILITY:  [facility]   PHYSICIAN:  Clydene Fake, M.D.               DATE OF BIRTH:  June 06, 1966   DATE OF PROCEDURE:  02/12/2004  DATE OF DISCHARGE:                                 OPERATIVE REPORT   PREOPERATIVE DIAGNOSIS:  Herniated nucleus pulposus and spondylosis at C4-5  with myelopathy.   POSTOPERATIVE DIAGNOSIS:  Herniated nucleus pulposus and spondylosis at C4-5  with myelopathy.   OPERATION PERFORMED:  Anterior cervical decompression, diskectomy and fusion  at C4-5 with LifeNet allograft bone tether anterior cervical plate.   SURGEON:  Clydene Fake, M.D.   ASSISTANT:  Hilda Lias, M.D.   ANESTHESIA:  General endotracheal.   ESTIMATED BLOOD LOSS:  Minimal.   BLOOD REPLACED:  None.   DRAINS:  None.   COMPLICATIONS:  None.   INDICATIONS FOR PROCEDURE:  Patient is a 45 year old who has had neck and  shoulder pain, headaches, tremors in arms and legs.  MRI was done of the  cervical spine showing large central disk herniation causing cord  compression.  Patient brought for decompression fusion.   DESCRIPTION OF PROCEDURE:  The patient was brought to the operating room.  General anesthesia induced.  Patient was placed in 10 pounds halter  traction, prepped and draped in sterile fashion.  Site of incision was  injected with 10 mL of Xylocaine with epinephrine.  Incision was then made  from the midline to the anterior border of the sternocleidomastoid muscle on  the left side.  Incision taken down to the platysma.  Hemostasis obtained  with Bovie cautery.  The platysma was incised with the Bovie and blunt  dissection was taken to the anterior cervical fascia and the anterior  cervical spine.  Large osteophyte was seen.  Needle was placed over that  disk space.  X-ray was  obtained showing this was the 4-5 interspace.  Disk  space was incised with a 15 blade and partial diskectomy was performed as  the needle was removed.  The longus colli muscle was reflected laterally on  each side and self-retaining retractor was placed.  Osteophytes were removed  and diskectomy was performed with pituitary rongeurs and curets. Microscope  was brought into the field at this point and using 1 and 2 mm Kerrison  punches the posterior ligament disk herniation and osteophytes were removed.  There was central indentation of the cord and as we removed the central  process this resolved.  We had a good central decompression of the spinal  cord.  Bilateral foraminotomies were done with Kerrison punches.  When we  were finished, we had good central and lateral decompression.  A high speed  drill was used to remove cartilaginous end plates at each side.  LifeNet  trials were used to find the height of the bone plug and measured 7 mm and  this 7 mm LifeNet allograft bone was then tapped into place, countersunk  about 1 mm.  We checked the position of the graft,  there was plenty of room  between dura and bone graft.  Wound was irrigated with antibiotic solution.  Weight was removed from the traction.  Also while we did the diskectomy,  distraction pins were placed in C4 and 5 to hold distraction.  These were  also removed at this point.  Hemostasis of the bone was obtained with  Gelfoam and thrombin.  Tether anterior cervical plate was placed over the  anterior cervical spine.  Two screws placed into C4 and two into C5.  These  were tightened down and lateral x-rays obtained showing good position of  plates, screws and bone graft at the 4-5 level.  Wound was irrigated with  antibiotic solution.  Retractors removed.  Hemostasis obtained with bipolar  cauterization and Gelfoam and Thrombin.  Gelfoam was irrigated out.  The  platysma was closed with 3-0 Vicryl interrupted  suture.   Subcutaneous  tissue was closed with same and skin closed with Benzoin and Steri-Strips.  Dressing was placed.  The patient was awakened from anesthesia and placed in  a soft cervical collar and transferred to recovery room in stable condition.                                               Clydene Fake, M.D.    JRH/MEDQ  D:  02/12/2004  T:  02/13/2004  Job:  [id number]

## 2011-03-18 NOTE — Op Note (Signed)
NAMECHELCI, WINTERMUTE               ACCOUNT NO.:  [id number]   MEDICAL RECORD NO.:  [id number]          PATIENT TYPE:  OIB   LOCATION:  5036                         FACILITY:  [facility]   PHYSICIAN:  Sharolyn Douglas, M.D.        DATE OF BIRTH:  May 27, 1966   DATE OF PROCEDURE:  DATE OF DISCHARGE:                                 OPERATIVE REPORT   DIAGNOSIS:  Left L5-S1 herniated nucleus pulposus with left lower extremity  radiculopathy.   PROCEDURE:  Left L5-S1 microdiskectomy.   SURGEON:  Sharolyn Douglas, M.D.   ASSISTANT:  Verlin Fester, P.A.   ANESTHESIA:  General endotracheal.   ESTIMATED BLOOD LOSS:  Minimal.   COMPLICATIONS:  None.   INDICATIONS:  The patient is a pleasant 45 year old female with severe left  lower extremity radiculopathy secondary to extruded disk herniation at L5-  S1. She has failed to respond to conservative measures and elected to  proceed with microdiskectomy. Risk and benefits reviewed.   DESCRIPTION OF PROCEDURE:  The patient was identified in the holding area  and taken to the operating room, and underwent general endotracheal  anesthesia difficulty, given prophylactic IV antibiotics. Carefully turned  supine onto the Wilson frame. All bony prominences padded. Face aspect at  all times. Back prepped and draped in usual sterile fashion. Fluoroscopy was  brought onto the field. The L5-S1 level was identified. A small 2-cm  incision was made, just the left of midline over the interspace. Dissection  was carried sharply through the deep fascia. Dilators were used form the Max-  S retractor system to spread the paraspinal muscles docking on the L5-S1  interspace. The 70-mm blades were then attached to the Max-S retractor and  slid over the final dilator. The retractor was attached to the operating  room table using the attachment arm.   The retractor was then expanded gently dilating the paraspinal muscles. The  microscope was draped and brought onto the field. A  final fluoroscopic  imaging confirmed that we were well-positioned over the L5-S1 interspace.  High-speed bur used to remove the inferior one-third of the L5 lamina.  Ligamentum flavum was removed piecemeal. The S1 nerve root was mobilized  medially and the spinal canal explored. An extruded disk herniation was  identified medial to the pedicle which was displacing the S1 nerve root.  Several fragments were removed using the micro pituitary. The disk space  itself was entered and several additional loose fragments were removed. The  wound was irrigated; hemostasis was achieved. Fentanyl 2 mL was left in the  epidural space for postoperative  analgesia. The deep fascia closed with an #0 Vicryl, subcutaneous layer  closed with 2-0 Vicryl, followed by Dermabond to approximate the skin edges.  The patient was turned supine, extubated without difficulty, and transferred  to recovery in stable condition.      Sharolyn Douglas, M.D.  Electronically Signed     MC/MEDQ  D:  07/07/2005  T:  07/08/2005  Job:  [id number]

## 2011-03-18 NOTE — Discharge Summary (Signed)
[place]. [facility]  Patient:    NOVA, SCHMUHL                      MRN: [id number] Adm. Date:  [id number] Disc. Date: [id number] Attending:  Carrie Mew CC:         Sonda Primes, M.D. [facility]   Discharge Summary  DISCHARGE DIAGNOSES: 1. Chest pain, likely musculoskeletal. 2. Hyperlipidemia. 3. Status post hysterectomy and oophorectomy. 4. History of abdominal adhesions. 5. History of inguinal hernia.  DISCHARGE MEDICATIONS: 1. Tricor 160 mg p.o. q.d. 2. Premarin 0.9 mg p.o. q.d. 3. Calcium 1200 p.o. q.d. 4. Pravachol 80 mg p.o. q.d.  CONDITION ON DISCHARGE:  Improved, although still has musculoskeletal chest pain.  FOLLOWUP PLANS:  Dr. Posey Rea in one to two weeks.  HOSPITAL LABORATORIES:  Troponin and CKs all remained normal.  HOSPITAL PROCEDURES:  Stress Cardiolite demonstrated no evidence of ischemia.  HOSPITAL COURSE:  The patient was admitted to the hospital service on May 25, 2001, with a three day history of intermittent chest pain.  Although the chest pain sounded atypical, she does have a history of hyperlipidemia and a vague family history of coronary artery disease and for that reason she was admitted.  Laboratories as above.  Stress test as above.  She improved while hospitalized and will be discharged with a higher dose of Pravachol, up to 80 mg a day, and Vioxx 25 mg p.o. q.d.  Otherwise, her home medications will be the same.  DISCHARGE MEDICATIONS: 1. Pravachol 80 mg p.o. q.d. 2. Aspirin 325 mg p.o. q.d. 3. Vioxx 25 mg p.o. q.d. 4. Tricor 160 mg p.o. q.d. 5. Premarin 0.9 mg p.o. q.d. DD:  05/26/01 TD:  05/26/01 Job: [id number]

## 2011-03-18 NOTE — Assessment & Plan Note (Signed)
[facility]                             PRIMARY CARE OFFICE NOTE   Katherine Herrera, Katherine Herrera                      MRN:          [id number]  DATE:06/30/2006                            DOB:          Jan 12, 1966    CONSULTATION:   CONSULT REQUESTED BY:  Dr. Noel Gerold, orthopedic surgery.   REASON:  Medical clearance.  She is to have cervical spine surgery next  Thursday.   HISTORY:  The patient is a 45 year old female with multiple medical problems  including recurrent syncope with diabetes who presents for a pre-op  clearance.  She has no active complaints.  Continues to have syncopal spells  (near syncopal spells) as before.  She has been well controlled between 120-  130.   PAST MEDICAL HISTORY:  Recurrent syncopal episodes - vasovagal versus  others.  In 2004 she was diagnosed with psychogenic seizures (conversion  seizures) at [facility].  In 2005 she had C4, C5 ACF for myelopathy by  Dr. Phoebe Perch.  She had possible viral encephalitis in March 2003 combined  dyslipidemia and diabetes with hypertension.  Also has a history of  hypothyroidism.   ALLERGIES:  DEMEROL, NIASPAN, PAXIL, VYTORIN, METFORMIN.   CURRENT MEDICATIONS:  1. Amaryl 1 mg a day.  2. Actos 45 mg a day.  3. Vicodin 500 p.r.n.  4. Estrogen 1 mg daily.  5. Synthroid 75 micrograms daily.  6. Proamatine 5 mg four times daily.  7. Ecotrin 1 daily.   FAMILY HISTORY:  Mother had alcoholism.   SOCIAL HISTORY:  She is on disability.  Has been driving for five years.  Smokes one pack a day.  No alcohol.   REVIEW OF SYSTEMS:  No chest pain or shortness of breath, syncopal spells as  above.  No diarrhea.  Sugar is in well control.  Denies headaches.  The rest  is as above or negative.   PHYSICAL EXAMINATION:  VITAL SIGNS:  Blood pressure 120/85.  Pulse 84.  Temperature 96.9.  Weight 220 pounds.  GENERAL:  She is in no acute distress.  HEENT:  Moist mucosa.  NECK:  Supple. No  thyromegaly or bruit.  LUNGS:  Clear to auscultation and percussion.  No wheezes or rales.  HEART:  S1, S2, no murmur, no gallop.  ABDOMEN:  Soft. Non-tender.  EXTREMITIES:  No edema.  NEUROLOGICAL:  She is alert and oriented x4.  Denies being depressed at  present.  SKIN:  Clear.   LABORATORY DATA:  Labs not available.   ASSESSMENT/PLAN:  1. Surgical clearance. The patient should be clear for surgery if her lab      work, EKG, chest x-ray are acceptable on Tuesday,  (she is going to      have lab at short stay prior to surgery).  Dr. Felicity Coyer will be happy      to help you out with in patient management if she needs to stay after      surgery.  I told her not to take Amaryl on the day of surgery.  2. Type 2 diabetes.  Blood sugars have been in  130-135 range.  She is on      Amaryl 1 mg daily.  Hold Amaryl on day of surgery.  I would plan to      increase Amaryl to 1 mg twice a day after surgery when she resumes      diet.  I would use sliding scale Humalog perioperatively.  3. Syncope, chronic. Will continue with Proamatine, may require more fluid      peri-operatively.  Her heart catheterization was normal in July 2003.  4. Tobacco smoking with chronic obstructive pulmonary disease of a non-      degree.  I would use albuterol peri-operatively and she was advised to      stop smoking as soon as possible.                                   Sonda Primes, MD   AP/MedQ  DD:  06/30/2006  DT:  06/30/2006  Job #:  [id number]   cc:   Sharolyn Douglas, MD
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
## 2011-03-18 NOTE — H&P (Signed)
[place]. [facility]  Patient:    Katherine Herrera, Katherine Herrera Visit Number: [id number] MRN: [id number]          Service Type: MED Location: [phone] Attending Physician:  Dorena Cookey Dictated by:   Sonda Primes, M.D. [facility] Admit Date:  01/10/2002 Discharge Date: 01/14/2002   CC:         Marlan Palau, M.D.   History and Physical  DATE OF BIRTH:  1966-01-04  CHIEF COMPLAINT:  Sluggish, confused, passing out.  HISTORY OF PRESENT ILLNESS:  The patient is a 45 year old female who was recently hospitalized for syncope, chest pain, and other problems, discharged last Monday.  After discharge she remained dizzy, sluggish, passing out, confused at times, very weak.  There has been no chest pain.  She denies orthostatic symptoms.  She passed out twice last night.  There was no seizure like activity.  PAST MEDICAL HISTORY:  Remote history of seizures when she was 45 years of age.  She was treated with phenobarbital at the time.  FAMILY HISTORY:  Her mother has history of alcoholism.  SOCIAL HISTORY:  The patient does drink alcohol.  Denies using drugs.  CURRENT MEDICATIONS:  She has a prescription for Tricor and Pravachol, not taking lately.  She has been taking meclizine lately q.d.  Other medicines include no sedatives.  REVIEW OF SYSTEMS:  As above.  She has a new car.  No potential carbon monoxide leaks, etcetera.  Denies chills or fever.  She continues to have severe headache.  No nausea and vomiting.  No double vision.  PHYSICAL EXAMINATION  VITAL SIGNS:  Blood pressure lying 114/80, standing 114/80, heart rate 85, temperature 98.8, weight 218 pounds.  GENERAL:  She is in no acute distress.  Appears intoxicated and drowsy.  Had a hard time keeping her eyes open and communicating.  Speech is somewhat slurred.  HEENT:  Pupils:  Reactive.  There is a mild horizontal nystagmus.  NEUROLOGIC:  Hall-Pike maneuver is negative.  Muscle  strength within normal limits, although hard to assess due to poor cooperation.  Deep tendon reflexes are within normal limits.  No meningeal signs.  LUNGS:  Clear.  HEART:  Regular.  S1, S2.  No murmur.  No gallop.  ABDOMEN:  Soft, nontender.  EXTREMITIES:  Lower extremities without edema.  LABORATORIES:  Discharge summary from January 14, 2002 reviewed.  MRI done during last hospitalization revealed advanced cerebellar atrophy.  There is also a ______ CT scan done April 2001 with mild cerebellar atrophy.  ASSESSMENT AND PLAN: 1. Syncope of unclear etiology.  Could be neurocardiogenic or vasovagal.  Will    admit to the hospital.  Will hydrate with intravenous fluids.  Obtain    cortisol level and other tests. 2. Confusion, drowsiness, vertigo.  Rule out encephalitis.  Rule out toxic    reaction.  She does not have any CNS active drugs in her regimen except for    meclizine.  Doubt such a severe reaction to meclizine.  She may need a    spinal tap. 3. Cerebellar atrophy of unknown etiology.  Neurology consult is pending.    Obtain heavy metal screen, drug screen. 4. Hypothyroidism.  Continue replacement. Dictated by:   Sonda Primes, M.D. [facility] Attending Physician:  Dorena Cookey DD:  01/17/02 TD:  01/17/02 Job: [id number]

## 2011-03-18 NOTE — Cardiovascular Report (Signed)
NAME:  JALEAH, LEFEVRE                         ACCOUNT NO.:  [id number]   MEDICAL RECORD NO.:  [id number]                   PATIENT TYPE:  OIB   LOCATION:  2864                                 FACILITY:  [facility]   PHYSICIAN:  Cristy Hilts. Jacinto Halim, M.D.                  DATE OF BIRTH:  Jun 02, 1966   DATE OF PROCEDURE:  05/27/2002  DATE OF DISCHARGE:  05/27/2002                              CARDIAC CATHETERIZATION   PROCEDURE:  1. Left heart catheterization, including left ventriculography.  2. Selective right and left  coronary aortography.  3. Right femoral angiography and closure of right femoral arterial access     with Perclose.   INDICATIONS:  The patient is a 45 year old female with history of  hyperlipidemia, presently on two lipid-lowering therapies, who has been  having recurrent episodes of syncope. She underwent a Cardiolite stress test  which revealed anterior wall ischemia. Due to abnormal Cardiolite stress  test and recurrent syncope, she was brought to the cardiac catheterization  lab to evaluate her coronary anatomy.   HEMODYNAMIC DATA:  Left ventricular pressure 131/14, end diastolic pressure  23 mmHg.  Aortic pressure of 110/76 with mean of 92 mmHg.  There was no  pressure gradient across the aortic valve.   LEFT VENTRICULOGRAPHY:  Left ventricle:  The left ventricular systolic  function was estimated at 70%.  There was no mitral regurgitation.  There  was no wall motion abnormality.   CORONARY ANGIOGRAPHY:  1. Right coronary artery:  The right coronary artery is dominant artery.  It     is disease-free.  2. Left main coronary:  Left main coronary artery is a large caliber vessel     and is normal.  3. Circumflex:  The circumflex artery gives a very high AV groove branch and     continues as a large obtuse marginal 1.  This is normal.  4. Left anterior descending:  Left anterior descending artery is a large     caliber vessel and wraps around the apex.  It gives origin  to a moderate     diagonal one.  It is disease-free.   IMPRESSION:  1. Normal left ventricular systolic function, ejection fraction 70%.  2. Mildly elevated left ventricular end diastolic pressure, suggesting     diastolic dysfunction.  3. Normal coronary arteries.   RECOMMENDATIONS:  1. At this time my recommendations are to continue present medical therapy.     The stress test was falsely positive.  Will continue with risk factor     modification as it is again indicated.  2. Continued therapy with Midodrine for orthostatic hypertension as     indicated.   DESCRIPTION OF PROCEDURE:  Under the usual sterile precautions and using 6  [place] via the right femoral arterial access, a 6 [place] multipurpose BD  catheter was inserted in the right femoral artery and  ______________________. The catheter was  gently advanced into the left  ventricle and left ventricular pressures were monitored. Hand contrast  injections was performed in both the LAO and RAO projections. The catheter  was flushed with heparin and pulled back into the ascending aorta and left  ventricle and pressures was monitored. The right coronary artery was  selectively engaged and angiography was performed. In a similar fashion, the  left main  coronary artery was selectively engaged and angiography was performed. Then  the catheter was pulled out of the body. Right femoral angiography was  performed through the arterial access sheath and the access was closed with  Perclose; excellent hemostasis was obtained. The patient was discharged in  stable condition.                                                Cristy Hilts. Jacinto Halim, M.D.    Pilar Plate  D:  05/27/2002  T:  06/03/2002  Job:  [id number]   cc:   Georgina Quint. Plotnikov, M.D. [facility]   Ricky D. Gasper Sells, M.D.

## 2011-03-18 NOTE — Op Note (Signed)
Katherine Herrera, Katherine Herrera               ACCOUNT NO.:  [id number]   MEDICAL RECORD NO.:  [id number]          PATIENT TYPE:  OIB   LOCATION:  5040                         FACILITY:  [facility]   PHYSICIAN:  Sharolyn Douglas, M.D.        DATE OF BIRTH:  1966/08/12   DATE OF PROCEDURE:  07/06/2006  DATE OF DISCHARGE:                                 OPERATIVE REPORT   PREOPERATIVE DIAGNOSIS:  Cervical spondylotic radiculopathy C5-6 and C6-7  below previous C4-5 instrumented anterior cervical fusion.   POSTOPERATIVE DIAGNOSES:  Cervical spondylotic radiculopathy C5-6 and C6-7  below previous C4-5 instrumented anterior cervical fusion.   PROCEDURE:  1. Exploration of C4-5 fusion with removal of anterior cervical plate.  2. Anterior cervical diskectomy C5-6 and C6-7 with decompression of the      thecal sac and nerve roots bilaterally.  3. Anterior cervical arthrodesis C5-6, C6-7, placement of 2 allograft      prosthesis spacers packed with local autogenous bone graft.  4. Anterior cervical plating C5 through C7 using the Abbott spine system.   SURGEON:  Sharolyn Douglas, MD.   ASSISTANT:  Verlin Fester, P.A.   ANESTHESIA:  General endotracheal.   ESTIMATED BLOOD LOSS:  50 mL.   COMPLICATIONS:  None.   NEEDLE AND SPONGE COUNT:  Correct.   INDICATIONS:  The patient is a pleasant female with persistent neck and left  upper extremity pain.  She has failed to respond to all conservative  treatment.  She has had a previous anterior cervical diskectomy and fusion  with plate at O1-3.  She had spondylosis and foraminal narrowing below.  She  now presents for removal of the previous plate and extension of the fusion  across the C5-6 and C6-7 levels in hopes of improving her symptoms.  The  risks and benefits were reviewed.   DESCRIPTION OF PROCEDURE:  The patient was identified in the holding area  and after informed consent, she was taken to the operating room.  She  underwent general endotracheal anesthesia  without difficulty.  She was given  prophylactic IV antibiotics.  She was positioned supine with her neck in  slight extension using the [place] head rest, 5 pounds halter traction  applied, the neck prepped and draped in the usual sterile fashion. A  transverse incision was made at the level of the cricoid cartilage on left  side.  Dissection was carried sharply through the platysma.  The interval  between the SCM and strap muscles medially was developed down to the  prevertebral space.  The esophagus, trachea, and carotid sheath were  identified.  There was scarring from previous surgery and great care was  taken to avoid any injury to the adjacent structures.  The anterior cervical  spine was identified and the osteophytes were easily palpable along with the  anterior cervical plate.  Further dissection revealed the plate at Y8-6.  The longus colli muscle was elevated out over the C5-6 and C6-7 disk space  levels.  Deep retractors were placed.  We then used the appropriate  screwdrivers for the tether anterior cervical plate  which was then removed  by simply backing out the screws.  We then used the electrocautery to  explore the fusion at C4-5 and it appeared to be solid.  We then draped the  microscope.  The remainder of the procedure was done using the microscope  for magnification and lighting. We placed Caspar distraction pins in the C5,  C6 and C7 vertebral bodies.  Gentle distraction was applied.  We found the  disk spaces to be very degenerative and narrowed.  The diskectomy was  carried back to the posterior longitudinal ligament.  The high-speed bur was  used take down the uncovertebral joint and posterior vertebral margins.  A  Kerrison punch used to undercut the vertebral margins and complete wide  foraminotomies.  We felt we had a good decompression.  We then placed a 7 mm  allograft prosthesis spacer which had been packed with local autogenous bone  graft which was  collected from the local bone spurs along with the drill  shavings.  This was countersunk 1 mm into the interspace.  We then performed  a similar procedure at C6-7.  Again the uncovertebral spurs were drilled out  and the posterior dural margins were undercut, wide foraminotomies were  completed.  We found foraminal stenosis at both levels which appeared to be  worse on the right side.  Once we were satisfied with the decompression of  at C6-7, a 6-mm allograft prosthesis spacer was packed with local bone graft  and tamped into the interspace 1 mm.  We then turned our attention to  placing an anterior cervical plate using the Abbott spine system from C5 to  C7.  The previous screw holes were utilized proximally.  We used 4.0 mm  rescue screws 12 mm in length proximally.  The remaining four holes of the  plate were filled with 12 mm 3.5 screws.  The bone quality was good and the  screw purchase was excellent.  We ensured that the locking mechanism  engaged.  We took an intraoperative x-ray which showed good positioning of  the plate and screws as well as interbody grafts from C5 to C7.  We  inspected the esophagus, trachea, and carotid sheath and there were no  apparent injuries.  Hemostasis was achieved, the deep Penrose drain was left  in place, the platysma closed with interrupted 2-0 Vicryl suture.  Subcutaneous layer closed with interrupted 3-0 Vicryl followed by a running  4-0 subcuticular Vicryl suture on the skin edges.  Benzoin and Steri-Strips  placed.  Sterile dressing applied.  The patient was placed into a cervical  collar, extubated without difficulty and transferred to recovery in stable  condition able to move her upper and lower extremities.  It should be noted  that my assistant, Verlin Fester, PA,  helped throughout the procedure  including during the positioning, the exposure, the decompression, the instrumentation, removal of the exploration of the fusion, the  arthrodesis  and she also assisted with wound closure.      Sharolyn Douglas, M.D.  Electronically Signed     MC/MEDQ  D:  07/06/2006  T:  07/07/2006  Job:  [id number]

## 2011-03-18 NOTE — Consult Note (Signed)
NAME:  Katherine Herrera, Katherine Herrera                         ACCOUNT NO.:  [id number]   MEDICAL RECORD NO.:  [id number]                   PATIENT TYPE:  EMS   LOCATION:  MINO                                 FACILITY:  [facility]   PHYSICIAN:  Santina Evans A. Orlin Hilding, M.D.          DATE OF BIRTH:  12-02-1965   DATE OF CONSULTATION:  06/19/2002  DATE OF DISCHARGE:  06/19/2002                          INTERNAL MEDICINE CONSULTATION   NEUROLOGY CONSULTATION:   PATIENT ADDRESS:  [address] [place], [place] [address].   HISTORY OF PRESENT ILLNESS:  The patient is a 45 year old white right-handed  female well known to the neurology service who has a past medical history of  syncopal episodes dating back to March 2003.  These episodes are often  accompanied by chest pain, dizziness, sluggishness, generalized weakness and  confusion.  The patient has had an extensive neurological evaluation  including an MRI which showed cerebellar atrophy.  She also has had a normal  EEG and normal brainstem auditory-evoked potentials.  When she was initially  seen back in March, she was diagnosed with viral labyrinthitis and  symptomatically improved posthospitalization on meclizine and Valium.  Her  cerebellar disease was not felt to be a contributing factor and was thought  to be congenital.  The patient apparently did well for approximately 2  months and then began having syncopal episodes while at work.  Among her  complaints at the time were again, dizziness, generalized weakness and what  she described as triple vision.  The patient had subsequently been diagnosed  with orthostatic hypotension by her cardiologist, Dr. Jacinto Halim.  She had a  subsequent Cardiolite which was abnormal and underwent cardiac  catheterization on May 27, 2002 which showed normal coronary arteries.  Her  ejection fraction was 70%.  Her orthostatic hypotension has been treated  with ProAmatine and there has been some interval improvement of  the syncopal  episodes since being started on this medication.  The patient's husband  feels that her syncopal episodes seem to be exacerbated by stress at work.  She called Dr. Thad Ranger several days ago who advised her to decrease her  work hours to no more than 20 per week.  She reports having three episodes  of passing out while at work total.  After these episodes, the patient  reports that she becomes incoherent for approximately 1 hour where she  cannot respond to others although she can hear them and that she is  disoriented.  She then remains lethargic for the rest of the day.  She had  another episode today which was similar to her prior episodes.  She  complained of chest pain and right shoulder pain along with dizziness,  generalized weakness (like someone took a vacuum and sucked all of the  energy out of me).  She is sent here from Dr. Verl Dicker office for further  workup and evaluation.   PAST MEDICAL HISTORY:  1. The patient  is G5, P2, A3 (miscarriage x3).  2. Hyperlipidemia.  3. Recurrent syncope (normal cardiac catheterization May 27, 2002).  4. Hypothyroidism with decreased total T4.  5. Impaired glucose tolerance.  6. Advanced cerebellar atrophy (negative toxin workup), questionable     congenital issue.  7. Orthostatic hypotension (systolic blood pressure decreased by 30 mmHg     with no compensatory increase in heart rate).  8. Obesity.  9. History of hysterectomy on hormone replacement therapy.  10.      History of hernia repair.  11.      Viral labyrinthitis.  12.      Seizure disorder in teens (normal EEG on March 2003).  13.      Peripheral paresthesias thought to be secondary to hypothyroidism.  14.      Migraine headaches.   ALLERGIES:  DEMEROL causes hives.   CURRENT MEDICATIONS:  1. Synthroid 75 mcg p.o. q.d. (recently increased from 50 mcg q.d.).  2. Pravachol 80 mg q.h.s. (discontinued while on event monitor).  3. Tricor 160 mg p.o. q.d.  4.  Premarin 0.9 mg p.o. q.h.s.  5. ProAmatine 10 mg p.o. b.i.d.  6. Aspirin 325 mg p.o. q.d.  7. Calcium supplements.  8. Black seed oil.   FAMILY HISTORY:  The patient's mother is alive with diabetes and  hypercholesterolemia as well as hypertension.  The patient reports that her  father is deceased secondary to lung cancer.  Apparently, he died fairly  young in his late 3s.   SOCIAL HISTORY:  The patient is married with two adult children.  She  currently does not drink or smoke cigarettes.  She has recently been  employed full time but has cut down to part time secondary to increased  stress at work.   REVIEW OF SYSTEMS:  CONSTITUTIONAL: No fever or chills.  No significant  weight changes.  HEENT: No recent visual complaints including blurry vision  or diplopia.  History of seeing triple.  None recent.  CARDIOVASCULAR: Chest  pain as per HPI.  No arrhythmias.  No lower extremity edema.  RESPIRATORY:  Some shortness of breath.  No paroxysmal nocturnal dyspnea.  GI: No bowel  incontinence.  No abdominal pain.  Bowels move normally.  No hematochezia or  melena.  GU: The patient reports some frequency of urination, particularly  after these episodes but no dysuria.  No hematuria.  MUSCULOSKELETAL: The  patient reports generalized weakness but no focal complaints.  NEUROLOGICAL:  As per HPI.  Some headaches.  History of seizures, none since teens.   PHYSICAL EXAMINATION:  GENERAL: The patient is alert and oriented x3.  Speech is fluent.  No evidence of dysarthria or slurring of speech.  Mood is  stable with affect full range.  The patient is able to concentrate well and  has good fund of knowledge.  CRANIAL NERVES: Pupils are equal and briskly reactive.  There is slight  horizontal nystagmus.  Extraocular movements are intact.  Visual fields are  full.  The face, tongue and palate move normally and symmetrically.  Hearing is intact to finger rub.  MOTOR: Normal bulk and tone in all  extremities.  STRENGTH: She has 5/5 strength to her biceps, triceps, deltoids, hand grips,  quadriceps, gastrocnemius, anterior tibialis, and iliopsoas muscles.  SENSATION: Sensation is intact to light touch and pinprick in all  extremities.  Position sense is intact.  REFLEXES: Diminished tendon jerks in the biceps, triceps, and  brachioradialis tendons.  She has 2+ patellars.  Unable  to elicit ankle  jerks.  Toes are downgoing bilaterally.  She does have high arches.  CEREBELLAR: Gait is normal.  She can walk in tandem and heel-to-toe.  No  dysmetria with finger-nose-finger, heel-to-shin, or rapid alternating  movements.   ASSESSMENT AND PLAN:  The patient is a 45 year old white female with past  medical history of syncope, cerebellar atrophy, remote history of seizures,  and hypothyroidism.  The patient's neurological examination is  currently normal and nonfocal.  There is no sign of muscular weakness or  incoordination.  The patient's presentation was discussed with Dr. Thad Ranger,  who sees her at the office.  The plan is to follow up with him next week,  with an already scheduled appointment.  The patient will remain out of work  until that time.     Hillery Aldo, M.D.                      Catherine A. Orlin Hilding, M.D.    CR/MEDQ  D:  06/19/2002  T:  06/21/2002  Job:  [id number]

## 2011-03-18 NOTE — Consult Note (Signed)
. [facility]  Patient:    Katherine Herrera, Katherine Herrera Visit Number: [id number] MRN: [id number]          Service Type: MED Location: [phone] Attending Physician:  Tresa Garter Dictated by:   Kelli Hope, M.D. Proc. Date: 01/17/02 Admit Date:  01/17/2002                            Consultation Report  DATE OF BIRTH:  November 28, 1974  REQUESTING PHYSICIAN:  Sonda Primes, M.D.  REASON FOR CONSULTATION:  Dizziness and syncope.  HISTORY OF PRESENT ILLNESS:  This is a reconsultation evaluation on this existing [facility] patient, a 45 year old woman admitted about six daysago with recurrent syncope and dizziness. She was evaluated by Dr. Anne Hahn at that time who thought she had a normal examination except for some nystagmus and diagnosed labyrinthitis and vasovagal syncope, as her episodes of loss of consciousness were fairly classic for vasovagal events. She had an MRI of the brain which was unremarkable except for some fairy impressive cerebellar atrophy and was discharged on meclizine. Since then, she has persisted with a dull frontal headache and episodic dizziness, then over the last 24 hours, she has had three more syncopal episodes and subsequently admitted. There has been some concern of her grogginess and confusion. The patient and her husband say that she has felt intermittently groggy and confused; however, on my arrival, she is alert and fully oriented. She says she is still having some dizziness as well as some numbness of her extremities.  PAST MEDICAL HISTORY:  Remarkable for elevated cholesterol and a recent diagnosis of hypothyroidism. She also had epilepsy as a child and was on phenobarbital for some time.  MEDICATIONS: 1. Tricor. 2. Pravachol. 3. Meclizine 12.5 mg q.6 p.r.n. 4. Vioxx. 5. Protonix. 6. Synthroid.  FAMILY HISTORY:  Her mother alive with diabetes and high cholesterol. Her father  died of lung cancer.  SOCIAL HISTORY:  She does not currently drink or smoke. She lives with her husband and two children.  REVIEW OF SYSTEMS:  Some dull frontal headaches and dizziness as above. She also has some night sweats, generalized weakness, numbness, insomnolence. She denies weight loss, diplopia, hearing loss or tinnitus.  PHYSICAL EXAMINATION:  VITAL SIGNS:  Temperature 97.1, blood pressure 117/70, pulse 74, respirations 20.  GENERAL:  She is alert and in no evident distress. Speech is fluent and not dysarthric. Mood is euthymic and affect appropriate.  CRANIAL NERVES:  Pupils are equal and briskly reactive. Examination of examination reveals bilateral and horizontal nystagmus with a little bit of rotatory nystagmus on up gaze. The face, tongue, and palate moves normally and symmetrically. Hearing is intact to finger rub. Motor exam reveals normal bulk and tone in all extremities. Sensation is intact to light touch in all extremities. Examination of reflexes reveals diminished tendon jerks in the lower extremities. Toes are downgoing. She does have high arches. Attempting to stand, up she complains of dizziness which did not resolve and subsequently the gait exam was deferred. Finger-to-nose revealed slight dysmetria.  LABORATORY AND ACCESSORY DATA:  MRI of the brain is personally reviewed and revealed a cerebellar atrophy but is otherwise normal.  IMPRESSION: 1. Viral labyrinthitis, but I doubt if she has any sort of encephalitis, and    therefore, I do not feel she needs a lumbar puncture at this time. 2. Recurrent syncope, doubt these are seizure events. 3. Cerebellar  atrophy, etiology of which is unclear, but it is not related to    her acute problem.  RECOMMENDATIONS: 1. Will check EEG. 2. Might try Valium for her symptomatic release and see this does any better    than meclizine. Dictated by:   Kelli Hope, M.D. Attending Physician:  Tresa Garter DD:  01/17/02 TD:  01/19/02 Job: [id number]

## 2011-03-18 NOTE — Discharge Summary (Signed)
[place]. [facility]  Patient:    KANDA, DELUNA Visit Number: [id number] MRN: [id number]          Service Type: MED Location: [phone] Attending Physician:  Tresa Garter Dictated by:   Cornell Barman, P.A. Admit Date:  01/17/2002 Discharge Date: 01/24/2002                             Discharge Summary  DISCHARGE DIAGNOSES: 1. Dizziness. 2. Syncope. 3. Lethargy.  BRIEF ADMISSION HISTORY:  Ms. Bakke is a 45 year old white female who was recently hospitalized with syncope, chest pain, and peripheral paresthesia. The patient was discharged with resolution of her symptoms.  However, she states that after discharge, she had persistent dizziness, sluggishness, and several episodes of syncope.  PAST MEDICAL HISTORY: 1. A remote history of seizures, it looks like until maybe the age of 62.  She    was treated with phenobarbital. 2. Hypothyroidism diagnosed in March of 2003. 3. Chest wall inflammation. 4. Status post total abdominal hysterectomy. 5. History of ovarian cyst. 6. Hypercholesterolemia and triglyceridemia.  HOSPITAL COURSE:  #1 - NEUROLOGIC:  The patient presented with recurrent syncope and dizziness. We did ask for Kelli Hope, M.D., to see the patient.  He did review the patients MRI and noted that it revealed cerebellar atrophy but was otherwise normal.  He suspected a viral labyrinthitis as she did have nystagmus.  He did not see any evidence of encephalitis, therefore, did not feel a lumbar puncture was indicated.  In regard to her recurrent syncope, he did not feel that these were related to seizure activity.  He thought the cerebellar atrophy etiology was not clear but may be congenital.  He did recommend obtaining an EEG which was normal and ruled out any seizure activity.  The working diagnosis at this time is that the patient probably has, again, a viral labyrinthitis that may be worsened or exacerbated by her  cerebellar atrophy.  In regard to her syncope, she may have been a little bit orthostatic on admission. The patient did receive IV fluids and orthostatic blood pressures at this time have been normal.  There is no evidence of any vasovagal syncope or neurocardiogenic syncope at this time.  As noted, there were no structural changes on MRI.  Her EEG was normal and her EKG was normal. The patient has not had any arrhythmias on her telemetry monitoring.  As noted, the patient was found to be hypothyroid with a depressed T4 during her most recent admission.  This is likely the etiology of her peripheral paresthesias and we have started her on Synthroid and expect this to improve. We did have physical therapy see the patient and they noted that she did much better with her rolling walker.  We will arrange for a rolling walker at discharge and also arrange for outpatient physical therapy.  #2 - HYPOTHYROIDISM:  As noted, this was diagnosed during her previous hospitalization on January 10, 2002.  She was started on Synthroid.  #3 - MIGRAINE HEADACHES:  The patient does describe migraine headaches.  We have used some Imitrex during this hospitalization and she was given prescription at discharge.  We recommend the patient be referred to the headache clinic at some point.  LABS AT DISCHARGE:  Urine for heavy metal screen was negative for lead, mercury, or arsenic.  Sed rate was mildly elevated at 28.  Glucose was 178. TSH was 3.264.  B12 was 350. Ferritin was 160. Cortisol was 4.2.  Ceruloplasmin was 44.  RPR was nonreactive.  MRI revealed pan cerebellar atrophy versus hypothesis.  This was thought to be congenital.  Pons is noted to be of normal size and there were no acute lesions noted.  MEDICATIONS AT DISCHARGE: 1. Pravachol 80 mg q.d. 2. Tricor 160 mg q.d. 3. Premarin 0.9 mg q.d. 4. Synthroid 0.05 mg q.d. 5. Imitrex 25 mg p.r.n. 6. Protonix 40 mg q.d. 7. Meclozine 12.5 mg q.4-6h.  p.r.n. 8. Ambien 10 mg one half to one tablet h.s. p.r.n.  ACTIVITY:  No driving.  The patient has a note to be out of work January 17, 2002, through January 29, 2002.  FOLLOW-UP:  The patient is to follow up with Sonda Primes, M.D., on Monday, January 28, 2002.  Again, he may want to consider outpatient [facility]. Dictated by:   Cornell Barman, P.A. Attending Physician:  Tresa Garter DD:  01/24/02 TD:  01/25/02 Job: [id number]

## 2011-03-18 NOTE — Consult Note (Signed)
[place]. [facility]  Patient:    Katherine Herrera, Katherine Herrera Visit Number: [id number] MRN: [id number]          Service Type: MED Location: [phone] Attending Physician:  Dorena Cookey Dictated by:   Marlan Palau, M.D. Proc. Date: 01/12/02 Admit Date:  01/10/2002   CC:         Titus Dubin. Alwyn Ren, M.D. [facility]  [facility]; [address] [facility].   Consultation Report  HISTORY OF PRESENT ILLNESS:  Katherine Herrera is a 45 year old right-handed white female born 1966/06/11 with a history of obesity, diabetes, history of seizures as a child.  This patient has come to the [facility] after having a syncopal event that occurred at work.  Patient claims she had gone up to use the bathroom, urinated, returned, and began coming dizzy, light-headed, diaphoretic, nauseated.  Patient was in the sitting position, but still felt poorly.  Blurred vision was noted.  Patient apparently then lost consciousness.  EMS was called.  Upon arrival patient was still in the sitting position.  Blood pressure was 82/palpable.  CBG was 82. Patient was given IV fluids and transferred to the stretcher.  Patient recalls EMS being present.  Total duration of loss of consciousness is not clear. Patient has continued to feel dizzy during this hospitalization.  Had some unsteadiness with gait.  CT scan of the head done through the emergency room was unremarkable.  Patient denies any clear history of hypoglycemic episodes previously.  Neurology was asked to see this patient for further evaluation. Patient notes no new numbness or weakness on the face, arms, or legs.  Does have some history of numbness below the elbows and below the knees bilaterally.  PAST MEDICAL HISTORY: 1. History of syncope. 2. Obesity. 3. Diabetes. 4. Seizures as a child off anticonvulsants for a number of years. 5. History of hysterectomy. 6. Ovarian cyst resection. 7. History of  hernia surgery. 8. History of hypercholesterolemia. 9. History of skin graft as a teenager.  ALLERGIES:  DEMEROL.  SOCIAL HISTORY:  Does not smoke or drink.  [place] in [place].  Is married.  Has two children ages 69 and 96, both daughters.  One daughter has seizures.  CURRENT MEDICATIONS: 1. Toradol p.r.n. 2. Protonix 40 mg q.d. 3. Tylenol p.r.n. 4. Patient was given one dose of Solu-Medrol. 5. Is currently on Vioxx 25 mg one q.d. 6. Prior to admission patient was on Premarin, Tricor, multivitamins, Niaspan.  FAMILY HISTORY:  Notable that mother is alive with hypertension, diabetes, hypercholesterolemia.  Father died with cancer.  Patient has two brothers.  As far as she knows they are alive and well.  REVIEW OF SYSTEMS:  Ongoing dizziness.  No fever, chills.  Patient has headache problems.  Does note some shortness of breath.  Had some chest pain on admission.  Does note some nausea.  Has had nose bleed during this admission.  Denies any problems controlling the bowels or the bladder.  No new numbness or weakness on the face, arms, or legs.  PHYSICAL EXAMINATION  VITAL SIGNS:  Blood pressure currently 130/70, heart rate 76, respiratory rate 20, temperature afebrile.  GENERAL:  This patient is a moderately obese white female who is alert and cooperative at the time of examination.  HEENT:  Head is atraumatic.  Eyes:  Pupils are equal, round, and reactive to light.  Disks are flat bilaterally.  NECK:  Supple.  No carotid bruits  noted.  RESPIRATORY:  Clear.  CARDIOVASCULAR:  Regular rate and rhythm.  No obvious murmurs or rubs noted.  EXTREMITIES:  Without significant edema.  NEUROLOGIC:  Cranial nerves as above.  Facial symmetry is present.  Patient has good sensation to face to pin prick and soft touch bilaterally.  Patient has good strength to the facial muscles and the muscles to head turn and shoulder shrug bilaterally.  Patient has fair  finger-nose-finger, heel-to-shin bilaterally.  Patient is able to ambulate, walk slowly.  No pronator drift is seen.  Romberg negative.  Patient, again, does have some end gaze nystagmus bilaterally.  Strength is full, normal on all fours.  Patient has good symmetric, but somewhat depressed reflexes and toes are neutral bilaterally. Patient has symmetric pin prick, soft touch, vibratory sensation throughout.  LABORATORIES:  Notable for sodium 142, potassium 4.1, chloride 105, CO2 28, glucose 101, BUN 17, creatinine 0.9, calcium 9.1.  CPK 64, troponin I less than 0.01.  Cholesterol level 132, triglycerides 263, HDL 31, VLDL 53, LDL 48. White count 10.9, hemoglobin 13.1, hematocrit 37.9, MCV 81.7, platelets 249,000.  Calcium level 8.4, total protein 6.7, albumin 3.4, AST 21, ALT 16, ALP 54, total bilirubin 0.4, magnesium 2.1.  Urine pregnancy test negative. Urinalysis reveals specific gravity 1.035, pH 5.5, negative nitrites.  Chest x-ray shows no acute abnormalities.  CT of the chest showed no pulmonary emboli, mild bilateral lower lobe atelectasis.  EKG reveals normal sinus rhythm, normal EKG, heart rate 71.  IMPRESSION: 1. Probable vasovagal syncope. 2. Obesity. 3. Borderline diabetes. 4. Nystagmus by examination, rule out vestibulitis.  This patient has a nonfocal neurologic examination at this point.  Does have some bilateral end gaze nystagmus at this point.  Patient walks slowly, but is able to ambulate fairly well.  No focal neurologic findings otherwise are seen.  It is possible this patient may have viral vestibulitis resulting in some dizziness with onset of vasovagal syncope.  No other obvious source of the syncope is noted.  Need to consider and rule out the possibility of a hypoglycemic event.  Glucose tolerance test needs to be performed.  PLAN: 1. Will consider addition of low dose meclizine if needed for dizziness.  2. MRI scan of brain if dizziness and gait  problems do not improve over the    next several days. 3. Will follow patients clinical course while in-house.  Patient should do    well in the future. Dictated by:   Marlan Palau, M.D. Attending Physician:  Dorena Cookey DD:  01/12/02 TD:  01/14/02 Job: [id number]

## 2011-03-29 ENCOUNTER — Other Ambulatory Visit: Payer: Self-pay | Admitting: *Deleted

## 2011-03-29 MED ORDER — LEVOTHYROXINE SODIUM 75 MCG PO TABS: 75.0000 ug | ORAL_TABLET | Freq: Every day | ORAL | Status: DC

## 2011-03-29 MED ORDER — GLIMEPIRIDE 1 MG PO TABS: 0.5000 mg | ORAL_TABLET | Freq: Every day | ORAL | Status: DC

## 2011-04-06 ENCOUNTER — Other Ambulatory Visit: Payer: Self-pay | Admitting: *Deleted

## 2011-04-06 MED ORDER — PIOGLITAZONE HCL 45 MG PO TABS: 45.0000 mg | ORAL_TABLET | Freq: Every day | ORAL | Status: DC

## 2011-04-11 ENCOUNTER — Other Ambulatory Visit: Payer: Managed Care, Other (non HMO)

## 2011-04-11 ENCOUNTER — Other Ambulatory Visit: Payer: Self-pay | Admitting: Internal Medicine

## 2011-04-11 DIAGNOSIS — E119 Type 2 diabetes mellitus without complications: Secondary | ICD-10-CM

## 2011-04-11 DIAGNOSIS — E538 Deficiency of other specified B group vitamins: Secondary | ICD-10-CM

## 2011-04-11 DIAGNOSIS — Z0000 Encounter for general adult medical examination without abnormal findings: Secondary | ICD-10-CM

## 2011-04-11 DIAGNOSIS — T887XXA Unspecified adverse effect of drug or medicament, initial encounter: Secondary | ICD-10-CM

## 2011-04-12 ENCOUNTER — Other Ambulatory Visit (INDEPENDENT_AMBULATORY_CARE_PROVIDER_SITE_OTHER): Payer: Managed Care, Other (non HMO) | Admitting: Internal Medicine

## 2011-04-12 ENCOUNTER — Other Ambulatory Visit (INDEPENDENT_AMBULATORY_CARE_PROVIDER_SITE_OTHER): Payer: Managed Care, Other (non HMO)

## 2011-04-12 DIAGNOSIS — E119 Type 2 diabetes mellitus without complications: Secondary | ICD-10-CM

## 2011-04-12 DIAGNOSIS — Z0000 Encounter for general adult medical examination without abnormal findings: Secondary | ICD-10-CM

## 2011-04-12 DIAGNOSIS — T887XXA Unspecified adverse effect of drug or medicament, initial encounter: Secondary | ICD-10-CM

## 2011-04-12 DIAGNOSIS — E538 Deficiency of other specified B group vitamins: Secondary | ICD-10-CM

## 2011-04-12 LAB — URINALYSIS, ROUTINE W REFLEX MICROSCOPIC
Hgb urine dipstick: NEGATIVE
Ketones, ur: NEGATIVE
Leukocytes, UA: NEGATIVE
Nitrite: NEGATIVE
Specific Gravity, Urine: >=1.03 (ref 1.000–1.030)
Urine Glucose: NEGATIVE
Urobilinogen, UA: 1 (ref 0.0–1.0)
pH: 5 (ref 5.0–8.0)

## 2011-04-12 LAB — HEPATIC FUNCTION PANEL
ALT: 20 U/L (ref 0–35)
AST: 19 U/L (ref 0–37)
Albumin: 4.1 g/dL (ref 3.5–5.2)
Alkaline Phosphatase: 77 U/L (ref 39–117)
Bilirubin, Direct: 0.1 mg/dL (ref 0.0–0.3)
Total Bilirubin: 0.6 mg/dL (ref 0.3–1.2)
Total Protein: 7.3 g/dL (ref 6.0–8.3)

## 2011-04-12 LAB — TSH: TSH: 1.62 u[IU]/mL (ref 0.35–5.50)

## 2011-04-12 LAB — CBC WITH DIFFERENTIAL/PLATELET
Basophils Absolute: 0 10*3/uL (ref 0.0–0.1)
Basophils Relative: 0.4 % (ref 0.0–3.0)
Eosinophils Absolute: 0.1 10*3/uL (ref 0.0–0.7)
Eosinophils Relative: 1.9 % (ref 0.0–5.0)
HCT: 43.9 % (ref 36.0–46.0)
Hemoglobin: 14.9 g/dL (ref 12.0–15.0)
Lymphocytes Relative: 32.9 % (ref 12.0–46.0)
Lymphs Abs: 2.6 10*3/uL (ref 0.7–4.0)
MCHC: 34 g/dL (ref 30.0–36.0)
MCV: 87.8 fl (ref 78.0–100.0)
Monocytes Absolute: 0.4 10*3/uL (ref 0.1–1.0)
Monocytes Relative: 5.6 % (ref 3.0–12.0)
Neutro Abs: 4.6 10*3/uL (ref 1.4–7.7)
Neutrophils Relative %: 59.2 % (ref 43.0–77.0)
Platelets: 227 10*3/uL (ref 150.0–400.0)
RBC: 5 Mil/uL (ref 3.87–5.11)
RDW: 12.8 % (ref 11.5–14.6)
WBC: 7.8 10*3/uL (ref 4.5–10.5)

## 2011-04-12 LAB — BASIC METABOLIC PANEL
BUN: 19 mg/dL (ref 6–23)
CO2: 29 mEq/L (ref 19–32)
Calcium: 9.2 mg/dL (ref 8.4–10.5)
Chloride: 100 mEq/L (ref 96–112)
Creatinine, Ser: 0.9 mg/dL (ref 0.4–1.2)
GFR: 71.85 mL/min (ref >60.00)
Glucose, Bld: 167 mg/dL — ABNORMAL HIGH (ref 70–99)
Potassium: 5.1 mEq/L (ref 3.5–5.1)
Sodium: 135 mEq/L (ref 135–145)

## 2011-04-12 LAB — VITAMIN B12: Vitamin B-12: 1113 pg/mL — ABNORMAL HIGH (ref 211–911)

## 2011-04-12 LAB — LIPID PANEL
Cholesterol: 236 mg/dL — ABNORMAL HIGH (ref 0–200)
HDL: 38.4 mg/dL — ABNORMAL LOW (ref >39.00)
Total CHOL/HDL Ratio: 6
Triglycerides: 452 mg/dL — ABNORMAL HIGH (ref 0.0–149.0)
VLDL: 90.4 mg/dL — ABNORMAL HIGH (ref 0.0–40.0)

## 2011-04-12 LAB — URIC ACID: Uric Acid, Serum: 5.3 mg/dL (ref 2.4–7.0)

## 2011-04-12 LAB — HEMOGLOBIN A1C: Hgb A1c MFr Bld: 9.7 % — ABNORMAL HIGH (ref 4.6–6.5)

## 2011-04-12 LAB — LDL CHOLESTEROL, DIRECT: Direct LDL: 114.3 mg/dL

## 2011-04-18 ENCOUNTER — Ambulatory Visit (INDEPENDENT_AMBULATORY_CARE_PROVIDER_SITE_OTHER): Payer: Managed Care, Other (non HMO) | Admitting: Internal Medicine

## 2011-04-18 ENCOUNTER — Encounter: Payer: Self-pay | Admitting: Internal Medicine

## 2011-04-18 DIAGNOSIS — M791 Myalgia, unspecified site: Secondary | ICD-10-CM | POA: Insufficient documentation

## 2011-04-18 DIAGNOSIS — E119 Type 2 diabetes mellitus without complications: Secondary | ICD-10-CM

## 2011-04-18 DIAGNOSIS — R609 Edema, unspecified: Secondary | ICD-10-CM

## 2011-04-18 DIAGNOSIS — IMO0001 Reserved for inherently not codable concepts without codable children: Secondary | ICD-10-CM

## 2011-04-18 MED ORDER — GLIMEPIRIDE 1 MG PO TABS: ORAL_TABLET | ORAL | Status: DC

## 2011-04-18 NOTE — Progress Notes (Signed)
  Subjective:    Patient ID: Katherine Herrera, female    DOB: Feb 28, 1966, 45 y.o.   MRN: [id number]  HPI   The patient presents for a follow-up of  chronic hypertension, chronic dyslipidemia, type 2 diabetes controlled with medicines  Legs still hurt. C/o L ankle still hurts Review of Systems  Constitutional: Negative for chills, activity change, appetite change, fatigue and unexpected weight change.  HENT: Negative for congestion, mouth sores and sinus pressure.   Eyes: Negative for visual disturbance.  Respiratory: Negative for cough and chest tightness.   Gastrointestinal: Negative for nausea and abdominal pain.  Genitourinary: Negative for frequency, difficulty urinating and vaginal pain.  Musculoskeletal: Positive for myalgias, back pain, arthralgias and gait problem.  Skin: Negative for pallor and rash.  Neurological: Negative for dizziness, tremors, weakness, numbness and headaches.  Psychiatric/Behavioral: Negative for confusion and sleep disturbance.   Wt Readings from Last 3 Encounters:  04/18/11 245 lb (111.131 kg)  01/17/11 242 lb (109.77 kg)  10/18/10 243 lb (110.224 kg)       Objective:   Physical Exam  Constitutional: She appears well-developed. No distress.       Obese - in NAD  HENT:  Head: Normocephalic.  Right Ear: External ear normal.  Left Ear: External ear normal.  Nose: Nose normal.  Mouth/Throat: Oropharynx is clear and moist.  Eyes: Conjunctivae are normal. Pupils are equal, round, and reactive to light. Right eye exhibits no discharge. Left eye exhibits no discharge.  Neck: Normal range of motion. Neck supple. No JVD present. No tracheal deviation present. No thyromegaly present.  Cardiovascular: Normal rate, regular rhythm and normal heart sounds.   Pulmonary/Chest: No stridor. No respiratory distress. She has no wheezes.  Abdominal: Soft. Bowel sounds are normal. She exhibits no distension and no mass. There is no tenderness. There is no rebound and  no guarding.  Musculoskeletal: She exhibits no edema and no tenderness.  Lymphadenopathy:    She has no cervical adenopathy.  Neurological: She displays normal reflexes. No cranial nerve deficit. She exhibits normal muscle tone. Coordination normal.  Skin: No rash noted. No erythema.  Psychiatric: She has a normal mood and affect. Her behavior is normal. Judgment and thought content normal.        Lab Results  Component Value Date   WBC 7.8 04/12/2011   HGB 14.9 04/12/2011   HCT 43.9 04/12/2011   PLT 227.0 04/12/2011   CHOL 236* 04/12/2011   TRIG 452.0* 04/12/2011   HDL 38.40* 04/12/2011   LDLDIRECT 114.3 04/12/2011   ALT 20 04/12/2011   AST 19 04/12/2011   NA 135 04/12/2011   K 5.1 04/12/2011   CL 100 04/12/2011   CREATININE 0.9 04/12/2011   BUN 19 04/12/2011   CO2 29 04/12/2011   TSH 1.62 04/12/2011   HGBA1C 9.7* 04/12/2011     Assessment & Plan:

## 2011-04-18 NOTE — Assessment & Plan Note (Signed)
Hold Crestor 

## 2011-04-18 NOTE — Assessment & Plan Note (Signed)
Better  

## 2011-04-18 NOTE — Assessment & Plan Note (Addendum)
Worse 

## 2011-04-26 ENCOUNTER — Other Ambulatory Visit: Payer: Self-pay | Admitting: *Deleted

## 2011-04-26 MED ORDER — LAMOTRIGINE 25 MG PO TABS: 25.0000 mg | ORAL_TABLET | Freq: Two times a day (BID) | ORAL | Status: DC

## 2011-05-09 ENCOUNTER — Other Ambulatory Visit: Payer: Self-pay | Admitting: *Deleted

## 2011-05-09 MED ORDER — ROSUVASTATIN CALCIUM 40 MG PO TABS: 40.0000 mg | ORAL_TABLET | Freq: Every day | ORAL | Status: DC

## 2011-05-24 ENCOUNTER — Telehealth: Payer: Self-pay | Admitting: *Deleted

## 2011-05-24 NOTE — Telephone Encounter (Signed)
Rec Rf req for Hydroco/APAP 10-325. 1 po qid prn. # 120. Last filled 04-16-11. Ok to Rf?

## 2011-05-24 NOTE — Telephone Encounter (Signed)
OK to fill this prescription with additional refills x2 Thank you!  

## 2011-05-25 MED ORDER — HYDROCODONE-ACETAMINOPHEN 10-325 MG PO TABS: 1.0000 | ORAL_TABLET | Freq: Four times a day (QID) | ORAL | Status: DC | PRN

## 2011-06-29 ENCOUNTER — Other Ambulatory Visit: Payer: Self-pay | Admitting: Internal Medicine

## 2011-06-30 ENCOUNTER — Telehealth: Payer: Self-pay | Admitting: *Deleted

## 2011-06-30 NOTE — Telephone Encounter (Signed)
ciprofloxacin (CIPRO) 500 MG tablet [Pharmacy Med Name: CIPROFLOXACIN HCL 500 MG TAB] TAKE 1 TABLET EVERY 12 HOURS FOR 10 DAYS Disp: 20 tablet R: 0 Start: 06/29/2011 Class: Normal Last refill: 10/12/2010

## 2011-07-15 ENCOUNTER — Other Ambulatory Visit: Payer: Self-pay | Admitting: *Deleted

## 2011-07-15 MED ORDER — LAMOTRIGINE 25 MG PO TABS: 25.0000 mg | ORAL_TABLET | Freq: Two times a day (BID) | ORAL | Status: DC

## 2011-07-25 ENCOUNTER — Encounter: Payer: Self-pay | Admitting: Internal Medicine

## 2011-07-25 ENCOUNTER — Ambulatory Visit (INDEPENDENT_AMBULATORY_CARE_PROVIDER_SITE_OTHER): Payer: Managed Care, Other (non HMO) | Admitting: Internal Medicine

## 2011-07-25 ENCOUNTER — Other Ambulatory Visit (INDEPENDENT_AMBULATORY_CARE_PROVIDER_SITE_OTHER): Payer: Managed Care, Other (non HMO)

## 2011-07-25 VITALS — BP 120/74 | HR 80 | Temp 98.4°F | Resp 16 | Wt 243.0 lb

## 2011-07-25 DIAGNOSIS — E119 Type 2 diabetes mellitus without complications: Secondary | ICD-10-CM

## 2011-07-25 DIAGNOSIS — IMO0001 Reserved for inherently not codable concepts without codable children: Secondary | ICD-10-CM

## 2011-07-25 DIAGNOSIS — G5750 Tarsal tunnel syndrome, unspecified lower limb: Secondary | ICD-10-CM

## 2011-07-25 DIAGNOSIS — F411 Generalized anxiety disorder: Secondary | ICD-10-CM

## 2011-07-25 DIAGNOSIS — M791 Myalgia, unspecified site: Secondary | ICD-10-CM

## 2011-07-25 DIAGNOSIS — R609 Edema, unspecified: Secondary | ICD-10-CM

## 2011-07-25 DIAGNOSIS — R799 Abnormal finding of blood chemistry, unspecified: Secondary | ICD-10-CM

## 2011-07-25 DIAGNOSIS — E538 Deficiency of other specified B group vitamins: Secondary | ICD-10-CM

## 2011-07-25 LAB — CK: Total CK: 98 U/L (ref 7–177)

## 2011-07-25 MED ORDER — GABAPENTIN 300 MG PO CAPS: 300.0000 mg | ORAL_CAPSULE | Freq: Four times a day (QID) | ORAL | Status: DC

## 2011-07-25 NOTE — Assessment & Plan Note (Signed)
monitoring

## 2011-07-25 NOTE — Assessment & Plan Note (Signed)
Post - op changes

## 2011-07-25 NOTE — Assessment & Plan Note (Signed)
Continue with current prescription therapy as reflected on the Med list.  

## 2011-07-25 NOTE — Progress Notes (Signed)
  Subjective:    Patient ID: Katherine Herrera, female    DOB: 1966-01-08, 45 y.o.   MRN: [id number]  HPI  The patient presents for a follow-up of  chronic hypertension, chronic dyslipidemia, type 2 diabetes controlled with medicines  C/o L foot heel numbness C/o Lat feet pain   Review of Systems  Constitutional: Negative for chills, activity change, appetite change, fatigue and unexpected weight change.  HENT: Negative for congestion, mouth sores and sinus pressure.   Eyes: Negative for visual disturbance.  Respiratory: Negative for cough and chest tightness.   Gastrointestinal: Negative for nausea and abdominal pain.  Genitourinary: Negative for frequency, difficulty urinating and vaginal pain.  Musculoskeletal: Positive for arthralgias. Negative for back pain and gait problem.  Skin: Negative for pallor and rash.  Neurological: Negative for dizziness, tremors, weakness, numbness and headaches.  Psychiatric/Behavioral: Negative for confusion and sleep disturbance.       Objective:   Physical Exam  Constitutional: She appears well-developed and well-nourished. No distress.  HENT:  Head: Normocephalic.  Right Ear: External ear normal.  Left Ear: External ear normal.  Nose: Nose normal.  Mouth/Throat: Oropharynx is clear and moist.  Eyes: Conjunctivae are normal. Pupils are equal, round, and reactive to light. Right eye exhibits no discharge. Left eye exhibits no discharge.  Neck: Normal range of motion. Neck supple. No JVD present. No tracheal deviation present. No thyromegaly present.  Cardiovascular: Normal rate, regular rhythm and normal heart sounds.   Pulmonary/Chest: No stridor. No respiratory distress. She has no wheezes.  Abdominal: Soft. Bowel sounds are normal. She exhibits no distension and no mass. There is no tenderness. There is no rebound and no guarding.  Musculoskeletal: She exhibits edema and tenderness (LBP and B legs hurt).  Lymphadenopathy:    She has no  cervical adenopathy.  Neurological: She displays normal reflexes. No cranial nerve deficit. She exhibits normal muscle tone. Coordination normal.  Skin: No rash noted. No erythema.  Psychiatric: She has a normal mood and affect. Her behavior is normal. Judgment and thought content normal.          Assessment & Plan:

## 2011-07-26 ENCOUNTER — Telehealth: Payer: Self-pay | Admitting: Internal Medicine

## 2011-07-26 LAB — COMPREHENSIVE METABOLIC PANEL
ALT: 19 U/L (ref 0–35)
AST: 18 U/L (ref 0–37)
Albumin: 3.7 g/dL (ref 3.5–5.2)
Alkaline Phosphatase: 88 U/L (ref 39–117)
BUN: 15 mg/dL (ref 6–23)
CO2: 21 mEq/L (ref 19–32)
Calcium: 8.9 mg/dL (ref 8.4–10.5)
Chloride: 105 mEq/L (ref 96–112)
Creatinine, Ser: 0.8 mg/dL (ref 0.4–1.2)
GFR: 81.03 mL/min (ref >60.00)
Glucose, Bld: 279 mg/dL — ABNORMAL HIGH (ref 70–99)
Potassium: 4.5 mEq/L (ref 3.5–5.1)
Sodium: 142 mEq/L (ref 135–145)
Total Bilirubin: 0.3 mg/dL (ref 0.3–1.2)
Total Protein: 7.1 g/dL (ref 6.0–8.3)

## 2011-07-26 LAB — HEMOGLOBIN A1C: Hgb A1c MFr Bld: 9.1 % — ABNORMAL HIGH (ref 4.6–6.5)

## 2011-07-26 NOTE — Telephone Encounter (Signed)
Katherine Herrera , please, inform the patient: labs are OK, except for a very poor diabetes control RTC to discuss Victoza shots (non-insulin option) or insulin shots     Thank you !

## 2011-07-27 NOTE — Telephone Encounter (Signed)
Patient informed and scheduled for OV next week to discuss

## 2011-08-02 ENCOUNTER — Encounter: Payer: Self-pay | Admitting: Internal Medicine

## 2011-08-02 ENCOUNTER — Ambulatory Visit (INDEPENDENT_AMBULATORY_CARE_PROVIDER_SITE_OTHER): Payer: Managed Care, Other (non HMO) | Admitting: Internal Medicine

## 2011-08-02 VITALS — BP 102/80 | HR 84 | Temp 98.3°F | Resp 16 | Wt 241.0 lb

## 2011-08-02 DIAGNOSIS — E119 Type 2 diabetes mellitus without complications: Secondary | ICD-10-CM

## 2011-08-02 DIAGNOSIS — R209 Unspecified disturbances of skin sensation: Secondary | ICD-10-CM

## 2011-08-02 DIAGNOSIS — R609 Edema, unspecified: Secondary | ICD-10-CM

## 2011-08-02 MED ORDER — GLIMEPIRIDE 2 MG PO TABS: 2.0000 mg | ORAL_TABLET | Freq: Two times a day (BID) | ORAL | Status: DC

## 2011-08-02 NOTE — Patient Instructions (Signed)
Wt Readings from Last 3 Encounters:  08/02/11 241 lb (109.317 kg)  07/25/11 243 lb (110.224 kg)  04/18/11 245 lb (111.131 kg)

## 2011-08-02 NOTE — Assessment & Plan Note (Signed)
Risks associated with treatment noncompliance were discussed. Compliance was encouraged. 

## 2011-08-02 NOTE — Progress Notes (Signed)
  Subjective:    Patient ID: Katherine Herrera, female    DOB: 1966/02/09, 45 y.o.   MRN: [id number]  HPI F/u DM not well controlled  Review of Systems  Constitutional: Negative for chills, activity change, appetite change, fatigue and unexpected weight change.  HENT: Negative for congestion, mouth sores and sinus pressure.   Eyes: Negative for visual disturbance.  Respiratory: Negative for cough and chest tightness.   Gastrointestinal: Negative for nausea and abdominal pain.  Genitourinary: Negative for frequency, difficulty urinating and vaginal pain.  Musculoskeletal: Positive for arthralgias. Negative for back pain and gait problem.  Skin: Negative for pallor and rash.  Neurological: Negative for dizziness, tremors, weakness, numbness and headaches.  Psychiatric/Behavioral: Negative for confusion and sleep disturbance.       Objective:   Physical Exam  Constitutional: She appears well-developed. No distress.       obese  HENT:  Head: Normocephalic.  Right Ear: External ear normal.  Left Ear: External ear normal.  Nose: Nose normal.  Mouth/Throat: Oropharynx is clear and moist.  Eyes: Conjunctivae are normal. Pupils are equal, round, and reactive to light. Right eye exhibits no discharge. Left eye exhibits no discharge.  Neck: Normal range of motion. Neck supple. No JVD present. No tracheal deviation present. No thyromegaly present.  Cardiovascular: Normal rate, regular rhythm and normal heart sounds.   Pulmonary/Chest: No stridor. No respiratory distress. She has no wheezes.  Abdominal: Soft. Bowel sounds are normal. She exhibits no distension and no mass. There is no tenderness. There is no rebound and no guarding.  Musculoskeletal: She exhibits no edema and no tenderness.  Lymphadenopathy:    She has no cervical adenopathy.  Neurological: She displays normal reflexes. No cranial nerve deficit. She exhibits normal muscle tone. Coordination normal.  Skin: No rash noted. No  erythema.  Psychiatric: She has a normal mood and affect. Her behavior is normal. Judgment and thought content normal.      Lab Results  Component Value Date   WBC 7.8 04/12/2011   HGB 14.9 04/12/2011   HCT 43.9 04/12/2011   PLT 227.0 04/12/2011   CHOL 236* 04/12/2011   TRIG 452.0* 04/12/2011   HDL 38.40* 04/12/2011   LDLDIRECT 114.3 04/12/2011   ALT 19 07/25/2011   AST 18 07/25/2011   NA 142 07/25/2011   K 4.5 07/25/2011   CL 105 07/25/2011   CREATININE 0.8 07/25/2011   BUN 15 07/25/2011   CO2 21 07/25/2011   TSH 1.62 04/12/2011   HGBA1C 9.1* 07/25/2011       Assessment & Plan:

## 2011-08-05 ENCOUNTER — Encounter: Payer: Self-pay | Admitting: Internal Medicine

## 2011-08-05 NOTE — Assessment & Plan Note (Signed)
Better  NAS diet 

## 2011-08-05 NOTE — Assessment & Plan Note (Signed)
Treat DM 

## 2011-08-30 ENCOUNTER — Encounter: Payer: Self-pay | Admitting: Internal Medicine

## 2011-08-30 ENCOUNTER — Ambulatory Visit (INDEPENDENT_AMBULATORY_CARE_PROVIDER_SITE_OTHER): Payer: Managed Care, Other (non HMO) | Admitting: Internal Medicine

## 2011-08-30 VITALS — BP 122/86 | HR 94 | Temp 97.6°F | Wt 240.0 lb

## 2011-08-30 DIAGNOSIS — J069 Acute upper respiratory infection, unspecified: Secondary | ICD-10-CM | POA: Insufficient documentation

## 2011-08-30 DIAGNOSIS — E119 Type 2 diabetes mellitus without complications: Secondary | ICD-10-CM

## 2011-08-30 MED ORDER — PROMETHAZINE-CODEINE 6.25-10 MG/5ML PO SYRP: 5.0000 mL | ORAL_SOLUTION | ORAL | Status: AC | PRN

## 2011-08-30 MED ORDER — AZITHROMYCIN 250 MG PO TABS: ORAL_TABLET | ORAL | Status: AC

## 2011-08-30 NOTE — Progress Notes (Signed)
  Subjective:    Patient ID: Katherine Herrera, female    DOB: August 12, 1966, 45 y.o.   MRN: [id number]  HPI   HPI  C/o URI sx's x  20 days. C/o ST, cough, weakness. Not better with OTC medicines. Actually, the patient is getting worse. The patient did not sleep last night due to cough.  Review of Systems  Constitutional: Positive for fever, chills and fatigue.  HENT: Positive for congestion, rhinorrhea, sneezing and postnasal drip.   Eyes: Positive for photophobia and pain. Negative for discharge and visual disturbance.  Respiratory: Positive for cough and wheezing.   Positive for chest pain.  Gastrointestinal: Negative for vomiting, abdominal pain, diarrhea and abdominal distention.  Genitourinary: Negative for dysuria and difficulty urinating.  Skin: Negative for rash.  Neurological: Positive for dizziness, weakness and light-headedness.      Review of Systems     Objective:   Physical Exam  Constitutional: She appears well-developed and well-nourished. No distress.  HENT:  Head: Normocephalic.  Right Ear: External ear normal.  Left Ear: External ear normal.  Nose: Nose normal.  Mouth/Throat: Oropharynx is clear and moist.  Eyes: Conjunctivae are normal. Right eye exhibits no discharge. Left eye exhibits no discharge.       eryth mucosa`  Neck: Normal range of motion. Neck supple. No JVD present. No tracheal deviation present. No thyromegaly present.  Cardiovascular: Normal rate, regular rhythm and normal heart sounds.   Pulmonary/Chest: No stridor. No respiratory distress. She has no wheezes.  Abdominal: Soft. Bowel sounds are normal. She exhibits no distension and no mass. There is no tenderness. There is no rebound and no guarding.  Musculoskeletal: She exhibits no edema and no tenderness.  Lymphadenopathy:    She has no cervical adenopathy.  Neurological: She displays normal reflexes. No cranial nerve deficit. She exhibits normal muscle tone. Coordination normal.  Skin:  No rash noted. No erythema.  Psychiatric: She has a normal mood and affect. Her behavior is normal. Judgment and thought content normal.          Assessment & Plan:

## 2011-08-30 NOTE — Assessment & Plan Note (Signed)
See meds 

## 2011-08-30 NOTE — Assessment & Plan Note (Signed)
Continue with current prescription therapy as reflected on the Med list.  

## 2011-09-12 ENCOUNTER — Telehealth: Payer: Self-pay | Admitting: *Deleted

## 2011-09-12 NOTE — Telephone Encounter (Signed)
Rf req for Hydroc/APAP 10-325 1 po qid prn pain. # 120. Last filled 08-06-11. Ok to Rf?

## 2011-09-12 NOTE — Telephone Encounter (Signed)
OK to fill this prescription with additional refills x1 Thank you!  

## 2011-09-13 MED ORDER — HYDROCODONE-ACETAMINOPHEN 10-325 MG PO TABS: 1.0000 | ORAL_TABLET | Freq: Four times a day (QID) | ORAL | Status: DC | PRN

## 2011-09-17 ENCOUNTER — Other Ambulatory Visit: Payer: Self-pay | Admitting: Internal Medicine

## 2011-09-19 ENCOUNTER — Telehealth: Payer: Self-pay | Admitting: *Deleted

## 2011-09-19 NOTE — Telephone Encounter (Signed)
OK to fill this prescription with additional refills x3 Thank you!  

## 2011-09-19 NOTE — Telephone Encounter (Signed)
Rf req for zolpidem 10mg  1/2-1 po qhs prn. # 30. Last filled 02-08-11. Ok to Rf?

## 2011-09-20 MED ORDER — ZOLPIDEM TARTRATE 10 MG PO TABS: 5.0000 mg | ORAL_TABLET | Freq: Every evening | ORAL | Status: DC | PRN

## 2011-10-18 ENCOUNTER — Other Ambulatory Visit (INDEPENDENT_AMBULATORY_CARE_PROVIDER_SITE_OTHER): Payer: Managed Care, Other (non HMO)

## 2011-10-18 ENCOUNTER — Encounter: Payer: Self-pay | Admitting: Internal Medicine

## 2011-10-18 ENCOUNTER — Ambulatory Visit (INDEPENDENT_AMBULATORY_CARE_PROVIDER_SITE_OTHER): Payer: Managed Care, Other (non HMO) | Admitting: Internal Medicine

## 2011-10-18 VITALS — BP 110/80 | HR 84 | Temp 97.8°F | Resp 16 | Wt 247.0 lb

## 2011-10-18 DIAGNOSIS — IMO0001 Reserved for inherently not codable concepts without codable children: Secondary | ICD-10-CM

## 2011-10-18 DIAGNOSIS — R609 Edema, unspecified: Secondary | ICD-10-CM

## 2011-10-18 DIAGNOSIS — R799 Abnormal finding of blood chemistry, unspecified: Secondary | ICD-10-CM

## 2011-10-18 DIAGNOSIS — E119 Type 2 diabetes mellitus without complications: Secondary | ICD-10-CM

## 2011-10-18 DIAGNOSIS — F411 Generalized anxiety disorder: Secondary | ICD-10-CM

## 2011-10-18 DIAGNOSIS — E039 Hypothyroidism, unspecified: Secondary | ICD-10-CM

## 2011-10-18 DIAGNOSIS — G609 Hereditary and idiopathic neuropathy, unspecified: Secondary | ICD-10-CM

## 2011-10-18 DIAGNOSIS — E538 Deficiency of other specified B group vitamins: Secondary | ICD-10-CM

## 2011-10-18 DIAGNOSIS — M791 Myalgia, unspecified site: Secondary | ICD-10-CM

## 2011-10-18 LAB — VITAMIN B12: Vitamin B-12: 486 pg/mL (ref 211–911)

## 2011-10-18 LAB — CBC WITH DIFFERENTIAL/PLATELET
Basophils Absolute: 0.1 10*3/uL (ref 0.0–0.1)
Basophils Relative: 0.8 % (ref 0.0–3.0)
Eosinophils Absolute: 0.1 10*3/uL (ref 0.0–0.7)
Eosinophils Relative: 1 % (ref 0.0–5.0)
HCT: 40.8 % (ref 36.0–46.0)
Hemoglobin: 14.1 g/dL (ref 12.0–15.0)
Lymphocytes Relative: 28.5 % (ref 12.0–46.0)
Lymphs Abs: 2.8 10*3/uL (ref 0.7–4.0)
MCHC: 34.6 g/dL (ref 30.0–36.0)
MCV: 88.1 fl (ref 78.0–100.0)
Monocytes Absolute: 0.4 10*3/uL (ref 0.1–1.0)
Monocytes Relative: 3.8 % (ref 3.0–12.0)
Neutro Abs: 6.5 10*3/uL (ref 1.4–7.7)
Neutrophils Relative %: 65.9 % (ref 43.0–77.0)
Platelets: 257 10*3/uL (ref 150.0–400.0)
RBC: 4.63 Mil/uL (ref 3.87–5.11)
RDW: 13.5 % (ref 11.5–14.6)
WBC: 9.9 10*3/uL (ref 4.5–10.5)

## 2011-10-18 LAB — COMPREHENSIVE METABOLIC PANEL
ALT: 21 U/L (ref 0–35)
AST: 19 U/L (ref 0–37)
Albumin: 3.8 g/dL (ref 3.5–5.2)
Alkaline Phosphatase: 81 U/L (ref 39–117)
BUN: 16 mg/dL (ref 6–23)
CO2: 28 mEq/L (ref 19–32)
Calcium: 8.7 mg/dL (ref 8.4–10.5)
Chloride: 101 mEq/L (ref 96–112)
Creatinine, Ser: 1 mg/dL (ref 0.4–1.2)
GFR: 63.47 mL/min (ref >60.00)
Glucose, Bld: 215 mg/dL — ABNORMAL HIGH (ref 70–99)
Potassium: 4 mEq/L (ref 3.5–5.1)
Sodium: 136 mEq/L (ref 135–145)
Total Bilirubin: 0.2 mg/dL — ABNORMAL LOW (ref 0.3–1.2)
Total Protein: 7.2 g/dL (ref 6.0–8.3)

## 2011-10-18 LAB — HEMOGLOBIN A1C: Hgb A1c MFr Bld: 7.7 % — ABNORMAL HIGH (ref 4.6–6.5)

## 2011-10-18 LAB — CK: Total CK: 79 U/L (ref 7–177)

## 2011-10-18 LAB — TSH: TSH: 1.31 u[IU]/mL (ref 0.35–5.50)

## 2011-10-18 MED ORDER — CYCLOBENZAPRINE HCL 5 MG PO TABS: 5.0000 mg | ORAL_TABLET | Freq: Two times a day (BID) | ORAL | Status: DC | PRN

## 2011-10-18 MED ORDER — HYDROCODONE-ACETAMINOPHEN 10-325 MG PO TABS: 1.0000 | ORAL_TABLET | Freq: Four times a day (QID) | ORAL | Status: DC | PRN

## 2011-10-18 NOTE — Assessment & Plan Note (Signed)
Continue with current prescription therapy as reflected on the Med list.  

## 2011-10-18 NOTE — Assessment & Plan Note (Signed)
Meds, diet were adjusted

## 2011-10-18 NOTE — Assessment & Plan Note (Signed)
Crestor is on hold since 8/12 Check labs Flexeril prn

## 2011-10-19 ENCOUNTER — Telehealth: Payer: Self-pay | Admitting: Internal Medicine

## 2011-10-19 NOTE — Telephone Encounter (Signed)
Katherine Herrera, please, inform patient that all labs are OK Sugar control is much better Thx

## 2011-10-20 NOTE — Telephone Encounter (Signed)
Left message on machine for pt to return my call  

## 2011-10-22 NOTE — Progress Notes (Signed)
  Subjective:    Patient ID: Katherine Herrera, female    DOB: 03-Jun-1966, 45 y.o.   MRN: [id number]  HPI  The patient presents for a follow-up of  chronic hypertension, chronic dyslipidemia, type 2 diabetes   Review of Systems  Constitutional: Negative for chills, activity change, appetite change, fatigue and unexpected weight change.  HENT: Negative for congestion, mouth sores and sinus pressure.   Eyes: Negative for visual disturbance.  Respiratory: Negative for cough and chest tightness.   Gastrointestinal: Negative for nausea and abdominal pain.  Genitourinary: Negative for frequency, difficulty urinating and vaginal pain.  Musculoskeletal: Negative for back pain and gait problem.  Skin: Negative for pallor and rash.  Neurological: Negative for dizziness, tremors, weakness, numbness and headaches.  Psychiatric/Behavioral: Negative for suicidal ideas, confusion and sleep disturbance.       Objective:   Physical Exam  Constitutional: She appears well-developed. No distress.  HENT:  Head: Normocephalic.  Right Ear: External ear normal.  Left Ear: External ear normal.  Nose: Nose normal.  Mouth/Throat: Oropharynx is clear and moist.  Eyes: Conjunctivae are normal. Pupils are equal, round, and reactive to light. Right eye exhibits no discharge. Left eye exhibits no discharge.  Neck: Normal range of motion. Neck supple. No JVD present. No tracheal deviation present. No thyromegaly present.  Cardiovascular: Normal rate, regular rhythm and normal heart sounds.   Pulmonary/Chest: No stridor. No respiratory distress. She has no wheezes.  Abdominal: Soft. Bowel sounds are normal. She exhibits no distension and no mass. There is no tenderness. There is no rebound and no guarding.  Musculoskeletal: She exhibits tenderness (LS spine is tender). She exhibits no edema.  Lymphadenopathy:    She has no cervical adenopathy.  Neurological: She displays normal reflexes. No cranial nerve deficit.  She exhibits normal muscle tone. Coordination normal.  Skin: No rash noted. No erythema.  Psychiatric: She has a normal mood and affect. Her behavior is normal. Judgment and thought content normal.     Lab Results  Component Value Date   WBC 9.9 10/18/2011   HGB 14.1 10/18/2011   HCT 40.8 10/18/2011   PLT 257.0 10/18/2011   GLUCOSE 215* 10/18/2011   CHOL 236* 04/12/2011   TRIG 452.0* 04/12/2011   HDL 38.40* 04/12/2011   LDLDIRECT 114.3 04/12/2011   ALT 21 10/18/2011   AST 19 10/18/2011   NA 136 10/18/2011   K 4.0 10/18/2011   CL 101 10/18/2011   CREATININE 1.0 10/18/2011   BUN 16 10/18/2011   CO2 28 10/18/2011   TSH 1.31 10/18/2011   HGBA1C 7.7* 10/18/2011        Assessment & Plan:

## 2011-10-26 NOTE — Telephone Encounter (Signed)
Left detailed mess informing pt of below.  

## 2011-10-26 NOTE — Telephone Encounter (Signed)
Left message on machine for pt to return my call  

## 2011-10-28 ENCOUNTER — Ambulatory Visit: Payer: Managed Care, Other (non HMO) | Admitting: Internal Medicine

## 2011-11-03 ENCOUNTER — Other Ambulatory Visit: Payer: Self-pay | Admitting: *Deleted

## 2011-11-03 MED ORDER — LEVOTHYROXINE SODIUM 75 MCG PO TABS: 75.0000 ug | ORAL_TABLET | Freq: Every day | ORAL | Status: DC

## 2011-11-24 ENCOUNTER — Telehealth: Payer: Self-pay | Admitting: *Deleted

## 2011-11-24 NOTE — Telephone Encounter (Signed)
Rf req for Hydroco/APAP 10-325 1 po qid prn pain. #120. Last filled 12.13.12. Ok to Rf?

## 2011-11-25 MED ORDER — HYDROCODONE-ACETAMINOPHEN 10-325 MG PO TABS: 1.0000 | ORAL_TABLET | Freq: Four times a day (QID) | ORAL | Status: DC | PRN

## 2011-11-25 NOTE — Telephone Encounter (Signed)
OK to fill this prescription with additional refills x2 Thank you!  

## 2011-12-09 ENCOUNTER — Other Ambulatory Visit: Payer: Self-pay | Admitting: *Deleted

## 2011-12-09 MED ORDER — PIOGLITAZONE HCL 45 MG PO TABS: 45.0000 mg | ORAL_TABLET | Freq: Every day | ORAL | Status: DC

## 2011-12-19 ENCOUNTER — Encounter: Payer: Self-pay | Admitting: Internal Medicine

## 2011-12-19 ENCOUNTER — Ambulatory Visit (INDEPENDENT_AMBULATORY_CARE_PROVIDER_SITE_OTHER): Payer: Managed Care, Other (non HMO) | Admitting: Internal Medicine

## 2011-12-19 VITALS — BP 108/64 | HR 100 | Temp 97.5°F | Resp 16 | Wt 247.8 lb

## 2011-12-19 DIAGNOSIS — M109 Gout, unspecified: Secondary | ICD-10-CM

## 2011-12-19 DIAGNOSIS — E119 Type 2 diabetes mellitus without complications: Secondary | ICD-10-CM

## 2011-12-19 DIAGNOSIS — E538 Deficiency of other specified B group vitamins: Secondary | ICD-10-CM

## 2011-12-19 DIAGNOSIS — M545 Low back pain, unspecified: Secondary | ICD-10-CM

## 2011-12-19 DIAGNOSIS — M2550 Pain in unspecified joint: Secondary | ICD-10-CM

## 2011-12-19 DIAGNOSIS — J449 Chronic obstructive pulmonary disease, unspecified: Secondary | ICD-10-CM

## 2011-12-19 MED ORDER — GABAPENTIN 600 MG PO TABS: 600.0000 mg | ORAL_TABLET | Freq: Three times a day (TID) | ORAL | Status: DC

## 2011-12-19 MED ORDER — ROSUVASTATIN CALCIUM 10 MG PO TABS: 10.0000 mg | ORAL_TABLET | Freq: Every day | ORAL | Status: DC

## 2011-12-19 NOTE — Assessment & Plan Note (Signed)
Continue with current prescription therapy as reflected on the Med list.  

## 2011-12-19 NOTE — Progress Notes (Signed)
Patient ID: Katherine Herrera, female   DOB: 1966-01-22, 46 y.o.   MRN: [id number]  Subjective:    Patient ID: Katherine Herrera, female    DOB: 1966-08-25, 46 y.o.   MRN: [id number]  HPI  The patient presents for a follow-up of  chronic hypertension, chronic dyslipidemia, type 2 diabetes and FMS - asking to increase Gabapentin   Review of Systems  Constitutional: Negative for chills, activity change, appetite change, fatigue and unexpected weight change.  HENT: Negative for congestion, mouth sores and sinus pressure.   Eyes: Negative for visual disturbance.  Respiratory: Negative for cough and chest tightness.   Gastrointestinal: Negative for nausea and abdominal pain.  Genitourinary: Negative for frequency, difficulty urinating and vaginal pain.  Musculoskeletal: Positive for arthralgias. Negative for back pain and gait problem.  Skin: Negative for pallor and rash.  Neurological: Negative for dizziness, tremors, weakness, numbness and headaches.  Psychiatric/Behavioral: Negative for suicidal ideas, confusion and sleep disturbance. The patient is nervous/anxious.        Objective:   Physical Exam  Constitutional: She appears well-developed. No distress.  HENT:  Head: Normocephalic.  Right Ear: External ear normal.  Left Ear: External ear normal.  Nose: Nose normal.  Mouth/Throat: Oropharynx is clear and moist.  Eyes: Conjunctivae are normal. Pupils are equal, round, and reactive to light. Right eye exhibits no discharge. Left eye exhibits no discharge.  Neck: Normal range of motion. Neck supple. No JVD present. No tracheal deviation present. No thyromegaly present.  Cardiovascular: Normal rate, regular rhythm and normal heart sounds.   Pulmonary/Chest: No stridor. No respiratory distress. She has no wheezes.  Abdominal: Soft. Bowel sounds are normal. She exhibits no distension and no mass. There is no tenderness. There is no rebound and no guarding.  Musculoskeletal: She exhibits  tenderness (LS spine is tender, leg muscles hurt). She exhibits no edema.  Lymphadenopathy:    She has no cervical adenopathy.  Neurological: She displays normal reflexes. No cranial nerve deficit. She exhibits normal muscle tone. Coordination normal.  Skin: No rash noted. No erythema.  Psychiatric: She has a normal mood and affect. Her behavior is normal. Judgment and thought content normal.     Lab Results  Component Value Date   WBC 9.9 10/18/2011   HGB 14.1 10/18/2011   HCT 40.8 10/18/2011   PLT 257.0 10/18/2011   GLUCOSE 215* 10/18/2011   CHOL 236* 04/12/2011   TRIG 452.0* 04/12/2011   HDL 38.40* 04/12/2011   LDLDIRECT 114.3 04/12/2011   ALT 21 10/18/2011   AST 19 10/18/2011   NA 136 10/18/2011   K 4.0 10/18/2011   CL 101 10/18/2011   CREATININE 1.0 10/18/2011   BUN 16 10/18/2011   CO2 28 10/18/2011   TSH 1.31 10/18/2011   HGBA1C 7.7* 10/18/2011        Assessment & Plan:

## 2011-12-19 NOTE — Assessment & Plan Note (Signed)
FMS Increased current prescription therapy w/Gabapentin

## 2011-12-20 ENCOUNTER — Other Ambulatory Visit: Payer: Self-pay | Admitting: *Deleted

## 2011-12-20 MED ORDER — LAMOTRIGINE 25 MG PO TABS: 25.0000 mg | ORAL_TABLET | Freq: Two times a day (BID) | ORAL | Status: DC

## 2012-01-16 ENCOUNTER — Telehealth: Payer: Self-pay | Admitting: *Deleted

## 2012-01-16 NOTE — Telephone Encounter (Signed)
Pt is req Rx for a bathroom chair. Please advise.

## 2012-01-16 NOTE — Telephone Encounter (Signed)
Ok Thx 

## 2012-01-18 NOTE — Telephone Encounter (Signed)
Handwritten Rx mailed to pt.

## 2012-01-30 LAB — HM MAMMOGRAPHY

## 2012-02-27 ENCOUNTER — Other Ambulatory Visit: Payer: Self-pay | Admitting: Internal Medicine

## 2012-02-28 ENCOUNTER — Telehealth: Payer: Self-pay | Admitting: *Deleted

## 2012-02-28 MED ORDER — HYDROCODONE-ACETAMINOPHEN 10-325 MG PO TABS: 1.0000 | ORAL_TABLET | Freq: Four times a day (QID) | ORAL | Status: DC | PRN

## 2012-02-28 NOTE — Telephone Encounter (Signed)
OK to fill this prescription with additional refills x1 Thank you!  

## 2012-02-28 NOTE — Telephone Encounter (Signed)
Done

## 2012-02-28 NOTE — Telephone Encounter (Signed)
Requested Medications     NORCO 10-325 MG per tablet [Pharmacy Med Name: NORCO 10-325 TABLET]   TAKE 1 TABLET BY MOUTH 4 TIMES DAILY AS NEEDED.   Disp: 120 each Start: 02/27/2012  Class: Normal   Requested on: 11/25/2011   Originally ordered on: 11/17/2010  Last refill: 01/27/2012

## 2012-03-19 ENCOUNTER — Encounter: Payer: Self-pay | Admitting: Internal Medicine

## 2012-03-19 ENCOUNTER — Other Ambulatory Visit (INDEPENDENT_AMBULATORY_CARE_PROVIDER_SITE_OTHER): Payer: Managed Care, Other (non HMO)

## 2012-03-19 ENCOUNTER — Ambulatory Visit (INDEPENDENT_AMBULATORY_CARE_PROVIDER_SITE_OTHER): Payer: Managed Care, Other (non HMO) | Admitting: Internal Medicine

## 2012-03-19 VITALS — BP 128/92 | HR 84 | Temp 97.7°F | Resp 16 | Wt 243.0 lb

## 2012-03-19 DIAGNOSIS — E1140 Type 2 diabetes mellitus with diabetic neuropathy, unspecified: Secondary | ICD-10-CM

## 2012-03-19 DIAGNOSIS — E1142 Type 2 diabetes mellitus with diabetic polyneuropathy: Secondary | ICD-10-CM

## 2012-03-19 DIAGNOSIS — E1149 Type 2 diabetes mellitus with other diabetic neurological complication: Secondary | ICD-10-CM

## 2012-03-19 DIAGNOSIS — E538 Deficiency of other specified B group vitamins: Secondary | ICD-10-CM

## 2012-03-19 DIAGNOSIS — M109 Gout, unspecified: Secondary | ICD-10-CM

## 2012-03-19 DIAGNOSIS — R609 Edema, unspecified: Secondary | ICD-10-CM

## 2012-03-19 DIAGNOSIS — M545 Low back pain, unspecified: Secondary | ICD-10-CM

## 2012-03-19 DIAGNOSIS — F17200 Nicotine dependence, unspecified, uncomplicated: Secondary | ICD-10-CM

## 2012-03-19 DIAGNOSIS — E119 Type 2 diabetes mellitus without complications: Secondary | ICD-10-CM

## 2012-03-19 DIAGNOSIS — J309 Allergic rhinitis, unspecified: Secondary | ICD-10-CM | POA: Insufficient documentation

## 2012-03-19 LAB — BASIC METABOLIC PANEL
BUN: 15 mg/dL (ref 6–23)
CO2: 29 mEq/L (ref 19–32)
Calcium: 8.9 mg/dL (ref 8.4–10.5)
Chloride: 98 mEq/L (ref 96–112)
Creatinine, Ser: 0.8 mg/dL (ref 0.4–1.2)
GFR: 88.3 mL/min (ref >60.00)
Glucose, Bld: 224 mg/dL — ABNORMAL HIGH (ref 70–99)
Potassium: 4.2 mEq/L (ref 3.5–5.1)
Sodium: 135 mEq/L (ref 135–145)

## 2012-03-19 LAB — LIPID PANEL
Cholesterol: 265 mg/dL — ABNORMAL HIGH (ref 0–200)
HDL: 36.2 mg/dL — ABNORMAL LOW (ref >39.00)
Total CHOL/HDL Ratio: 7
Triglycerides: 422 mg/dL — ABNORMAL HIGH (ref 0.0–149.0)
VLDL: 84.4 mg/dL — ABNORMAL HIGH (ref 0.0–40.0)

## 2012-03-19 LAB — HEMOGLOBIN A1C: Hgb A1c MFr Bld: 8.7 % — ABNORMAL HIGH (ref 4.6–6.5)

## 2012-03-19 MED ORDER — HYDROCODONE-ACETAMINOPHEN 10-325 MG PO TABS: 1.0000 | ORAL_TABLET | Freq: Four times a day (QID) | ORAL | Status: DC | PRN

## 2012-03-19 MED ORDER — FLUTICASONE PROPIONATE 50 MCG/ACT NA SUSP: 2.0000 | Freq: Every day | NASAL | Status: DC

## 2012-03-19 MED ORDER — LORATADINE 10 MG PO TABS: 10.0000 mg | ORAL_TABLET | Freq: Every day | ORAL | Status: DC

## 2012-03-19 NOTE — Assessment & Plan Note (Signed)
Doing well 

## 2012-03-19 NOTE — Progress Notes (Signed)
  Subjective:    Patient ID: Katherine Herrera, female    DOB: [birth year], 46 y.o.   MRN: [id number]  HPI  The patient presents for a follow-up of  chronic hypertension, chronic dyslipidemia, type 2 diabetes and FMS - asking to increase Gabapentin C/o URI and allergy sx's x 1 wk  Wt Readings from Last 3 Encounters:  03/19/12 243 lb (110.224 kg)  12/19/11 247 lb 12 oz (112.379 kg)  10/18/11 247 lb (112.038 kg)   BP Readings from Last 3 Encounters:  03/19/12 128/92  12/19/11 108/64  10/18/11 110/80      Review of Systems  Constitutional: Negative for chills, activity change, appetite change, fatigue and unexpected weight change.  HENT: Negative for congestion, mouth sores and sinus pressure.   Eyes: Negative for visual disturbance.  Respiratory: Negative for cough and chest tightness.   Gastrointestinal: Negative for nausea and abdominal pain.  Genitourinary: Negative for frequency, difficulty urinating and vaginal pain.  Musculoskeletal: Positive for arthralgias. Negative for back pain and gait problem.  Skin: Negative for pallor and rash.  Neurological: Negative for dizziness, tremors, weakness, numbness and headaches.  Psychiatric/Behavioral: Negative for suicidal ideas, confusion and sleep disturbance. The patient is nervous/anxious.        Objective:   Physical Exam  Constitutional: She appears well-developed. No distress.  HENT:  Head: Normocephalic.  Right Ear: External ear normal.  Left Ear: External ear normal.  Nose: Nose normal.  Mouth/Throat: Oropharynx is clear and moist.  Eyes: Conjunctivae are normal. Pupils are equal, round, and reactive to light. Right eye exhibits no discharge. Left eye exhibits no discharge.  Neck: Normal range of motion. Neck supple. No JVD present. No tracheal deviation present. No thyromegaly present.  Cardiovascular: Normal rate, regular rhythm and normal heart sounds.   Pulmonary/Chest: No stridor. No respiratory distress. She has no  wheezes.  Abdominal: Soft. Bowel sounds are normal. She exhibits no distension and no mass. There is no tenderness. There is no rebound and no guarding.  Musculoskeletal: She exhibits tenderness (LS spine is tender, leg muscles hurt). She exhibits no edema.  Lymphadenopathy:    She has no cervical adenopathy.  Neurological: She displays normal reflexes. No cranial nerve deficit. She exhibits normal muscle tone. Coordination normal.  Skin: No rash noted. No erythema.  Psychiatric: She has a normal mood and affect. Her behavior is normal. Judgment and thought content normal.     Lab Results  Component Value Date   WBC 9.9 10/18/2011   HGB 14.1 10/18/2011   HCT 40.8 10/18/2011   PLT 257.0 10/18/2011   GLUCOSE 215* 10/18/2011   CHOL 236* 04/12/2011   TRIG 452.0* 04/12/2011   HDL 38.40* 04/12/2011   LDLDIRECT 114.3 04/12/2011   ALT 21 10/18/2011   AST 19 10/18/2011   NA 136 10/18/2011   K 4.0 10/18/2011   CL 101 10/18/2011   CREATININE 1.0 10/18/2011   BUN 16 10/18/2011   CO2 28 10/18/2011   TSH 1.31 10/18/2011   HGBA1C 7.7* 10/18/2011        Assessment & Plan:

## 2012-03-19 NOTE — Assessment & Plan Note (Signed)
Continue with current prescription therapy as reflected on the Med list.  

## 2012-03-19 NOTE — Assessment & Plan Note (Signed)
See meds 

## 2012-03-19 NOTE — Assessment & Plan Note (Signed)
Chronic, B feet involved Not too well controlled Continue with current prescription therapy as reflected on the Med list.

## 2012-03-19 NOTE — Assessment & Plan Note (Signed)
Better  

## 2012-03-20 ENCOUNTER — Telehealth: Payer: Self-pay | Admitting: Internal Medicine

## 2012-03-20 DIAGNOSIS — IMO0002 Reserved for concepts with insufficient information to code with codable children: Secondary | ICD-10-CM

## 2012-03-20 DIAGNOSIS — E1165 Type 2 diabetes mellitus with hyperglycemia: Secondary | ICD-10-CM

## 2012-03-20 LAB — LDL CHOLESTEROL, DIRECT: Direct LDL: 160.5 mg/dL

## 2012-03-20 LAB — TSH: TSH: 1.74 u[IU]/mL (ref 0.35–5.50)

## 2012-03-20 NOTE — Telephone Encounter (Signed)
Left mess for patient to call back.  

## 2012-03-20 NOTE — Telephone Encounter (Signed)
Katherine Herrera, please, inform patient that all labs are normal except for high glu and cholesterol I would suggest an Endocr cons to discuss Rx - ?OK to sche Thx

## 2012-03-21 NOTE — Telephone Encounter (Signed)
Pt informed. She is ok with endo consult. She request this be done in the next wk or so as her husband is going to have surgery soon.

## 2012-03-22 NOTE — Telephone Encounter (Signed)
Endocr cons Dr Everardo All - this or next wk pls Dx uncontr DM Thx

## 2012-04-04 ENCOUNTER — Encounter: Payer: Self-pay | Admitting: Endocrinology

## 2012-04-04 ENCOUNTER — Ambulatory Visit (INDEPENDENT_AMBULATORY_CARE_PROVIDER_SITE_OTHER): Payer: Managed Care, Other (non HMO) | Admitting: Endocrinology

## 2012-04-04 VITALS — BP 110/62 | HR 82 | Temp 97.6°F | Ht 69.5 in | Wt 243.0 lb

## 2012-04-04 DIAGNOSIS — E1140 Type 2 diabetes mellitus with diabetic neuropathy, unspecified: Secondary | ICD-10-CM

## 2012-04-04 DIAGNOSIS — E1142 Type 2 diabetes mellitus with diabetic polyneuropathy: Secondary | ICD-10-CM

## 2012-04-04 DIAGNOSIS — E1149 Type 2 diabetes mellitus with other diabetic neurological complication: Secondary | ICD-10-CM

## 2012-04-04 MED ORDER — SITAGLIPTIN PHOSPHATE 100 MG PO TABS: 100.0000 mg | ORAL_TABLET | Freq: Every day | ORAL | Status: DC

## 2012-04-04 NOTE — Progress Notes (Signed)
Subjective:    Patient ID: Katherine Herrera, female    DOB: 1965-12-05, 46 y.o.   MRN: [id number]  HPI pt states 7 years h/o dm, complicated by peripheral sensory neuropathy.  she has never been on insulin.  pt says his diet is "ok," and exercise is limited by health probs. Pt states many years of moderate pain at the lower back, and assoc neck pain. Past Medical History  Diagnosis Date  . Syncope     poss conversion disorder vs psychogenic seizures  . DM (diabetes mellitus)   . Hypothyroidism   . LBP (low back pain)     DR Noel Gerold  . Peripheral neuropathy   . COPD (chronic obstructive pulmonary disease)   . Vitamin D deficiency   . Migraine   . Hyperlipidemia   . Anxiety   . Gout     possible  . Tachycardia     diet coke overuse?  . TTS (tarsal tunnel syndrome) 2010    Left Foot [facility] in [place]    Past Surgical History  Procedure Date  . Cervical fusion     C4-5  . Abdominal hysterectomy   . Carpal tunnel release 2010    LEFT    History   Social History  . Marital Status: Married    Spouse Name: N/A    Number of Children: N/A  . Years of Education: N/A   Occupational History  . Not on file.   Social History Main Topics  . Smoking status: Former Smoker    Quit date: 12/01/2009  . Smokeless tobacco: Not on file  . Alcohol Use: No  . Drug Use: No  . Sexually Active: Yes   Other Topics Concern  . Not on file   Social History Narrative   GYN - Dr Cherie Dark cancerMother w/alcoholismMarried 3d GS due NovDiet Coke: 10 cans a dayRegular exercise - NOFormer smoker 2009 restared 2010 stopped 12/2009    Current Outpatient Prescriptions on File Prior to Visit  Medication Sig Dispense Refill  . Cholecalciferol (VITAMIN D3) 1000 UNIT tablet Take 2,000 Units by mouth daily.        Marland Kitchen estradiol (ESTRACE) 1 MG tablet Take 1 mg by mouth daily.        . fluticasone (FLONASE) 50 MCG/ACT nasal spray Place 2 sprays into the nose daily.  16 g  6  . gabapentin  (NEURONTIN) 600 MG tablet Take 1 tablet (600 mg total) by mouth 3 (three) times daily.  120 tablet  5  . glimepiride (AMARYL) 2 MG tablet Take 1 tablet (2 mg total) by mouth 2 (two) times daily.  60 tablet  11  . HYDROcodone-acetaminophen (NORCO) 10-325 MG per tablet Take 1 tablet by mouth 4 (four) times daily as needed. For PAIN  120 tablet  1  . lamoTRIgine (LAMICTAL) 25 MG tablet Take 1 tablet (25 mg total) by mouth 2 (two) times daily.  120 tablet  1  . levothyroxine (SYNTHROID, LEVOTHROID) 75 MCG tablet Take 1 tablet (75 mcg total) by mouth daily.  30 tablet  5  . loratadine (CLARITIN) 10 MG tablet Take 1 tablet (10 mg total) by mouth daily.  100 tablet  3  . pioglitazone (ACTOS) 45 MG tablet Take 1 tablet (45 mg total) by mouth daily.  30 tablet  5  . rosuvastatin (CRESTOR) 10 MG tablet Take 1 tablet (10 mg total) by mouth daily.  90 tablet  3  . vitamin B-12 (CYANOCOBALAMIN) 1000 MCG tablet Take 1,000  mcg by mouth daily.        Marland Kitchen zolpidem (AMBIEN) 10 MG tablet Take 0.5-1 tablets (5-10 mg total) by mouth at bedtime as needed. For insomnia  30 tablet  3  . cyclobenzaprine (FLEXERIL) 5 MG tablet Take 5-10 mg by mouth 2 (two) times daily as needed.        Allergies  Allergen Reactions  . Crestor (Rosuvastatin Calcium)     myalgia  . Lovastatin   . Meperidine Hcl   . Metformin   . Niacin   . Paroxetine     Family History  Problem Relation Age of Onset  . Diabetes Mother   . Hypertension Mother   . Cancer Father 36    ? bone cancer    BP 110/62  Pulse 82  Temp(Src) 97.6 F (36.4 C) (Oral)  Ht 5' 9.5" (1.765 m)  Wt 243 lb (110.224 kg)  BMI 35.37 kg/m2  SpO2 96%  Review of Systems denies blurry vision, headache, chest pain, sob, n/v, urinary frequency, excessive diaphoresis, memory loss, depression, and rhinorrhea.  She has been eval for left foot pain.  She has numbness of the toes, easy bruising, and leg cramps.  She has lost a few lbs, due to her efforts.  No menses due to  TAH.    Objective:   Physical Exam VS: see vs page GEN: no distress HEAD: head: no deformity eyes: no periorbital swelling, no proptosis external nose and ears are normal mouth: no lesion seen NECK: supple, thyroid is not enlarged CHEST WALL: no deformity LUNGS:  Clear to auscultation. CV: reg rate and rhythm, no murmur ABD: abdomen is soft, nontender.  no hepatosplenomegaly.  not distended.  no hernia MUSCULOSKELETAL: muscle bulk and strength are grossly normal.  no obvious joint swelling.  gait is normal and steady.  Healed surgical scar at the lower back. EXTEMITIES: no deformity.  no ulcer on the feet.  feet are of normal color and temp.  no edema.  Healed surgical scar on the left foot PULSES: dorsalis pedis intact bilat.  no carotid bruit NEURO:  cn 2-12 grossly intact.   readily moves all 4's.  sensation is intact to touch on the feet, but decreased from normal. SKIN:  Normal texture and temperature.  No rash or suspicious lesion is visible.   NODES:  None palpable at the neck PSYCH: alert, oriented x3.  Does not appear anxious nor depressed.  Lab Results  Component Value Date   HGBA1C 8.7* 03/19/2012      Assessment & Plan:  DM.  needs increased rx.  She should try to minimize the glimepiride. Low-back pain.  This limits exercise rx of DM. Weight loss, due to her efforts.  This helps the control of DM.

## 2012-04-04 NOTE — Patient Instructions (Addendum)
good diet and exercise habits significanly improve the control of your diabetes.  please let me know if you wish to be referred to a dietician, or for weight-loss surgery.  high blood sugar is very risky to your health.  you should see an eye doctor every year. controlling your blood pressure and cholesterol drastically reduces the damage diabetes does to your body.  this also applies to quitting smoking.  please discuss these with your doctor.  you should take an aspirin every day, unless you have been advised by a doctor not to. check your blood sugar once a day.  vary the time of day when you check, between before the 3 meals, and at bedtime.  also check if you have symptoms of your blood sugar being too high or too low.  please keep a record of the readings and bring it to your next appointment here.  please call us sooner if your blood sugar goes below 70, or if it stays over 200. For now: Add januvia 100 mg daily. Reduce the glimepiride to 1 mg daily. Refer to a dietician specialist.  you will receive a phone call, about a day and time for an appointment please come back for a follow-up appointment in 1 month.

## 2012-04-16 ENCOUNTER — Encounter: Payer: Self-pay | Admitting: *Deleted

## 2012-04-16 ENCOUNTER — Encounter: Payer: Managed Care, Other (non HMO) | Attending: Endocrinology | Admitting: *Deleted

## 2012-04-16 VITALS — Wt 240.8 lb

## 2012-04-16 DIAGNOSIS — E1140 Type 2 diabetes mellitus with diabetic neuropathy, unspecified: Secondary | ICD-10-CM

## 2012-04-16 DIAGNOSIS — Z713 Dietary counseling and surveillance: Secondary | ICD-10-CM | POA: Insufficient documentation

## 2012-04-16 DIAGNOSIS — E119 Type 2 diabetes mellitus without complications: Secondary | ICD-10-CM | POA: Insufficient documentation

## 2012-04-16 NOTE — Patient Instructions (Addendum)
Goals:  Follow Diabetes Meal Plan as instructed  Eat 3 meals and 2 snacks, every 3-5 hrs  Limit carbohydrate intake to 30-45 grams carbohydrate/meal  Limit carbohydrate intake to 15 grams carbohydrate/snack  Add lean protein foods to meals/snacks  Monitor glucose levels as instructed by your doctor  Aim for 30 mins of physical activity daily  Bring food record and glucose log to your next nutrition visit 

## 2012-04-16 NOTE — Progress Notes (Signed)
HgA1C: 8.7%  Patient was seen on 04/16/12 for the first of a series of three diabetes self-management courses at the [facility]. The following learning objectives were met by the patient during this course:   Defines the role of glucose and insulin  Identifies type of diabetes and pathophysiology  Defines the diagnostic criteria for diabetes and prediabetes  States the risk factors for Type 2 Diabetes  States the symptoms of Type 2 Diabetes  Defines Type 2 Diabetes treatment goals  Defines Type 2 Diabetes treatment options  States the rationale for glucose monitoring  Identifies A1C, glucose targets, and testing times  Identifies proper sharps disposal  Defines the purpose of a diabetes food plan  Identifies carbohydrate food groups  Defines effects of carbohydrate foods on glucose levels  Identifies carbohydrate choices/grams/food labels  States benefits of physical activity and effect on glucose  Review of suggested activity guidelines  Handouts given during class include:  Type 2 Diabetes: Basics Book  My Food Plan Book  Food and Activity Log  Patient has established the following initial goals:  Follow Diabetes Meal Plan as instructed  Eat 3 meals and 2 snacks, every 3-5 hrs  Limit carbohydrate intake to 30-45 grams carbohydrate/meal  Limit carbohydrate intake to 15 grams carbohydrate/snack  Add lean protein foods to meals/snacks  Monitor glucose levels as instructed by your doctor  Aim for 30 mins of physical activity daily  Bring food record and glucose log to your next nutrition visit  Follow-Up Plan: Attend Core 2 and Core 3 classes

## 2012-04-17 ENCOUNTER — Encounter: Payer: Self-pay | Admitting: *Deleted

## 2012-04-20 ENCOUNTER — Other Ambulatory Visit: Payer: Self-pay | Admitting: Internal Medicine

## 2012-05-08 ENCOUNTER — Ambulatory Visit (INDEPENDENT_AMBULATORY_CARE_PROVIDER_SITE_OTHER): Payer: Managed Care, Other (non HMO) | Admitting: Endocrinology

## 2012-05-08 ENCOUNTER — Other Ambulatory Visit: Payer: Self-pay | Admitting: *Deleted

## 2012-05-08 ENCOUNTER — Encounter: Payer: Self-pay | Admitting: *Deleted

## 2012-05-08 ENCOUNTER — Encounter: Payer: Self-pay | Admitting: Endocrinology

## 2012-05-08 ENCOUNTER — Encounter: Payer: Managed Care, Other (non HMO) | Attending: Endocrinology | Admitting: *Deleted

## 2012-05-08 VITALS — BP 102/70 | HR 103 | Temp 98.0°F | Ht 69.0 in | Wt 247.0 lb

## 2012-05-08 DIAGNOSIS — E1140 Type 2 diabetes mellitus with diabetic neuropathy, unspecified: Secondary | ICD-10-CM

## 2012-05-08 DIAGNOSIS — E1149 Type 2 diabetes mellitus with other diabetic neurological complication: Secondary | ICD-10-CM

## 2012-05-08 DIAGNOSIS — E119 Type 2 diabetes mellitus without complications: Secondary | ICD-10-CM | POA: Insufficient documentation

## 2012-05-08 DIAGNOSIS — Z713 Dietary counseling and surveillance: Secondary | ICD-10-CM | POA: Insufficient documentation

## 2012-05-08 DIAGNOSIS — E039 Hypothyroidism, unspecified: Secondary | ICD-10-CM

## 2012-05-08 DIAGNOSIS — E1142 Type 2 diabetes mellitus with diabetic polyneuropathy: Secondary | ICD-10-CM

## 2012-05-08 MED ORDER — BROMOCRIPTINE MESYLATE 2.5 MG PO TABS: 2.5000 mg | ORAL_TABLET | Freq: Every day | ORAL | Status: DC

## 2012-05-08 MED ORDER — GLIMEPIRIDE 1 MG PO TABS: 1.0000 mg | ORAL_TABLET | Freq: Every day | ORAL | Status: DC

## 2012-05-08 MED ORDER — SITAGLIPTIN PHOSPHATE 100 MG PO TABS: 100.0000 mg | ORAL_TABLET | Freq: Every day | ORAL | Status: DC

## 2012-05-08 MED ORDER — PIOGLITAZONE HCL 45 MG PO TABS: 45.0000 mg | ORAL_TABLET | Freq: Every day | ORAL | Status: DC

## 2012-05-08 NOTE — Progress Notes (Signed)
  Patient was seen on 05/08/12 for the second of a series of three diabetes self-management courses at the [facility]. The following learning objectives were met by the patient during this course:   Explain basic nutrition maintenance and quality assurance  Describe causes, symptoms and treatment of hypoglycemia and hyperglycemia  Explain how to manage diabetes during illness  Describe the importance of good nutrition for health and healthy eating strategies  List strategies to follow meal plan when dining out  Describe the effects of alcohol on glucose and how to use it safely  Describe problem solving skills for day-to-day glucose challenges  Describe strategies to use when treatment plan needs to change  Identify important factors involved in successful weight loss  Describe ways to remain physically active  Describe the impact of regular activity on insulin resistance   Handouts given in class:  [facility] Oral medication/insulin handout  Follow-Up Plan: Patient will attend the final class of the ADA Diabetes Self-Care Education.   

## 2012-05-08 NOTE — Patient Instructions (Signed)
Goals:  Follow Diabetes Meal Plan as instructed  Eat 3 meals and 2 snacks, every 3-5 hrs  Limit carbohydrate intake to 45 grams carbohydrate/meal  Limit carbohydrate intake to 15 grams carbohydrate/snack  Add lean protein foods to meals/snacks  Monitor glucose levels as instructed by your doctor  Aim for 30 mins of physical activity daily  Bring food record and glucose log to your next nutrition visit  

## 2012-05-08 NOTE — Progress Notes (Signed)
Subjective:    Patient ID: Katherine Herrera, female    DOB: 01-10-1966, 46 y.o.   MRN: [id number]  HPI pt returns for f/u of type 2 DM (dx'ed 2006, complicated by peripheral sensory neuropathy).  She did not tolerate metformin (n/v).  no cbg record, but states cbg's are persistently in the high-200's.   Past Medical History  Diagnosis Date  . Syncope     poss conversion disorder vs psychogenic seizures  . DM (diabetes mellitus)   . Hypothyroidism   . LBP (low back pain)     DR Noel Gerold  . Peripheral neuropathy   . COPD (chronic obstructive pulmonary disease)   . Vitamin D deficiency   . Migraine   . Hyperlipidemia   . Anxiety   . Gout     possible  . Tachycardia     diet coke overuse?  . TTS (tarsal tunnel syndrome) 2010    Left Foot [facility] in [place]    Past Surgical History  Procedure Date  . Cervical fusion     C4-5  . Abdominal hysterectomy   . Carpal tunnel release 2010    LEFT    History   Social History  . Marital Status: Married    Spouse Name: N/A    Number of Children: N/A  . Years of Education: N/A   Occupational History  . Not on file.   Social History Main Topics  . Smoking status: Former Smoker    Quit date: 12/01/2009  . Smokeless tobacco: Not on file  . Alcohol Use: No  . Drug Use: No  . Sexually Active: Yes   Other Topics Concern  . Not on file   Social History Narrative   GYN - Dr Cherie Dark cancerMother w/alcoholismMarried 3d GS due NovDiet Coke: 10 cans a dayRegular exercise - NOFormer smoker 2009 restared 2010 stopped 12/2009    Current Outpatient Prescriptions on File Prior to Visit  Medication Sig Dispense Refill  . Cholecalciferol (VITAMIN D3) 1000 UNIT tablet Take 2,000 Units by mouth daily.        Marland Kitchen estradiol (ESTRACE) 1 MG tablet Take 1 mg by mouth daily.        Marland Kitchen FLEXERIL 5 MG tablet TAKE 1 OR 2 TABLETS BY MOUTH TWICE DAILY AS NEEDED FOR MUSCLE SPASMS  100 each  0  . gabapentin (NEURONTIN) 600 MG tablet Take 1 tablet  (600 mg total) by mouth 3 (three) times daily.  120 tablet  5  . HYDROcodone-acetaminophen (NORCO) 10-325 MG per tablet Take 1 tablet by mouth 4 (four) times daily as needed. For PAIN  120 tablet  1  . lamoTRIgine (LAMICTAL) 25 MG tablet Take 1 tablet (25 mg total) by mouth 2 (two) times daily.  120 tablet  1  . levothyroxine (SYNTHROID, LEVOTHROID) 75 MCG tablet Take 1 tablet (75 mcg total) by mouth daily.  30 tablet  5  . pioglitazone (ACTOS) 45 MG tablet Take 1 tablet (45 mg total) by mouth daily.  90 tablet  3  . rosuvastatin (CRESTOR) 10 MG tablet Take 1 tablet (10 mg total) by mouth daily.  90 tablet  3  . sitaGLIPtin (JANUVIA) 100 MG tablet Take 1 tablet (100 mg total) by mouth daily.  90 tablet  3  . vitamin B-12 (CYANOCOBALAMIN) 1000 MCG tablet Take 1,000 mcg by mouth daily.        Marland Kitchen zolpidem (AMBIEN) 10 MG tablet Take 0.5-1 tablets (5-10 mg total) by mouth at bedtime as needed. For  insomnia  30 tablet  3  . bromocriptine (PARLODEL) 2.5 MG tablet Take 1 tablet (2.5 mg total) by mouth at bedtime.  90 tablet  3  . fluticasone (FLONASE) 50 MCG/ACT nasal spray Place 2 sprays into the nose daily.  16 g  6  . loratadine (CLARITIN) 10 MG tablet Take 1 tablet (10 mg total) by mouth daily.  100 tablet  3    Allergies  Allergen Reactions  . Crestor (Rosuvastatin Calcium)     myalgia  . Lovastatin   . Meperidine Hcl   . Metformin Nausea And Vomiting  . Niacin   . Paroxetine     Family History  Problem Relation Age of Onset  . Diabetes Mother   . Hypertension Mother   . Cancer Father 39    ? bone cancer    BP 102/70  Pulse 103  Temp 98 F (36.7 C) (Oral)  Ht 5\' 9"  (1.753 m)  Wt 247 lb (112.038 kg)  BMI 36.48 kg/m2  SpO2 97%    Review of Systems denies hypoglycemia.    Objective:   Physical Exam VITAL SIGNS:  See vs page GENERAL: no distress Ext: trace bilat leg edema    Assessment & Plan:  DM.  needs increased rx

## 2012-05-08 NOTE — Patient Instructions (Addendum)
good diet and exercise habits significanly improve the control of your diabetes.  please let me know if you wish to be referred to a dietician, or for weight-loss surgery.  high blood sugar is very risky to your health.  you should see an eye doctor every year. controlling your blood pressure and cholesterol drastically reduces the damage diabetes does to your body.  this also applies to quitting smoking.  please discuss these with your doctor.  you should take an aspirin every day, unless you have been advised by a doctor not to. check your blood sugar once a day.  vary the time of day when you check, between before the 3 meals, and at bedtime.  also check if you have symptoms of your blood sugar being too high or too low.  please keep a record of the readings and bring it to your next appointment here.  please call us sooner if your blood sugar goes below 70, or if it stays over 200. Please add "bromocriptine," to help your blood sugar. It has possible side effects of nausea and dizziness.  These go away with time.  You can avoid these by taking it at bedtime, and by taking just take 1/2 pill for the first week.   please come back for a follow-up appointment in 1 month.

## 2012-05-22 DIAGNOSIS — E1140 Type 2 diabetes mellitus with diabetic neuropathy, unspecified: Secondary | ICD-10-CM

## 2012-05-30 NOTE — Progress Notes (Signed)
  Patient was seen on 05/22/2012 for the third of a series of three diabetes self-management courses at the [facility]. The following learning objectives were met by the patient during this course:    Describe how diabetes changes over time   Identify diabetes complications and ways to prevent them   Describe strategies that can promote heart health including lowering blood pressure and cholesterol   Describe strategies to lower dietary fat and sodium in the diet   Identify physical activities that benefit cardiovascular health   Evaluate success in meeting personal goal   Describe the belief that they can live successfully with diabetes day to day   Establish 2-3 goals that they will plan to diligently work on until they return for the free 49-month follow-up visit  The following handouts were given in class:  3 Month Follow Up Visit handout  Goal setting handout  Class evaluation form  Your patient has established the following 3 month goals for diabetes self-care:  Look for patterns in my record book at least 20 days a month  Test my glucose at least 1 times a day 7 days a week.  Follow-Up Plan: Patient will attend a 3 month follow-up visit for diabetes self-management education.

## 2012-06-07 ENCOUNTER — Other Ambulatory Visit: Payer: Self-pay | Admitting: Internal Medicine

## 2012-06-14 ENCOUNTER — Encounter: Payer: Self-pay | Admitting: Internal Medicine

## 2012-06-14 ENCOUNTER — Ambulatory Visit (INDEPENDENT_AMBULATORY_CARE_PROVIDER_SITE_OTHER): Payer: Medicare PPO | Admitting: Internal Medicine

## 2012-06-14 VITALS — BP 108/72 | HR 80 | Temp 97.8°F | Resp 15

## 2012-06-14 DIAGNOSIS — IMO0002 Reserved for concepts with insufficient information to code with codable children: Secondary | ICD-10-CM

## 2012-06-14 DIAGNOSIS — M2550 Pain in unspecified joint: Secondary | ICD-10-CM

## 2012-06-14 MED ORDER — AMOXICILLIN-POT CLAVULANATE 875-125 MG PO TABS: 1.0000 | ORAL_TABLET | Freq: Two times a day (BID) | ORAL | Status: AC

## 2012-06-14 MED ORDER — MUPIROCIN 2 % EX OINT: TOPICAL_OINTMENT | CUTANEOUS | Status: AC

## 2012-06-14 NOTE — Assessment & Plan Note (Signed)
No need for I&D she opened it up herself Augmentin x 10 d Mupirocin bid

## 2012-06-18 ENCOUNTER — Encounter: Payer: Self-pay | Admitting: Internal Medicine

## 2012-06-18 NOTE — Progress Notes (Signed)
  Subjective:    Patient ID: Katherine Herrera, female    DOB: Mar 07, 1966, 46 y.o.   MRN: [id number]  HPI   C/o paronychia x several days - she opened it up and drained the pus the other day...  Review of Systems  Constitutional: Negative for fever.       Objective:   Physical Exam  Constitutional: She appears well-developed.       Obese   Cardiovascular: Normal rate.   Pulmonary/Chest: She has no wheezes.  Musculoskeletal: She exhibits no edema and no tenderness.  Skin:       Finger (L index) w/o an abscess - still w/erythema          Assessment & Plan:

## 2012-06-19 ENCOUNTER — Ambulatory Visit: Payer: Managed Care, Other (non HMO) | Admitting: Internal Medicine

## 2012-06-19 ENCOUNTER — Ambulatory Visit (INDEPENDENT_AMBULATORY_CARE_PROVIDER_SITE_OTHER): Payer: Medicare PPO | Admitting: Endocrinology

## 2012-06-19 ENCOUNTER — Encounter: Payer: Self-pay | Admitting: Endocrinology

## 2012-06-19 VITALS — BP 110/72 | HR 94 | Temp 97.8°F | Ht 69.0 in | Wt 245.0 lb

## 2012-06-19 DIAGNOSIS — E1140 Type 2 diabetes mellitus with diabetic neuropathy, unspecified: Secondary | ICD-10-CM

## 2012-06-19 DIAGNOSIS — E1149 Type 2 diabetes mellitus with other diabetic neurological complication: Secondary | ICD-10-CM

## 2012-06-19 DIAGNOSIS — E1142 Type 2 diabetes mellitus with diabetic polyneuropathy: Secondary | ICD-10-CM

## 2012-06-19 NOTE — Patient Instructions (Addendum)
check your blood sugar once a day.  vary the time of day when you check, between before the 3 meals, and at bedtime.  also check if you have symptoms of your blood sugar being too high or too low.  please keep a record of the readings and bring it to your next appointment here.  please call us sooner if your blood sugar goes below 70, or if it stays over 200. please come back for a follow-up appointment in 2-4 weeks. Stop glimepiride, januvia, and actos.   Start humalog 5 units 3x a day (just before each meal).  Here are some samples.

## 2012-06-19 NOTE — Progress Notes (Signed)
Subjective:    Patient ID: Katherine Herrera, female    DOB: 17-Jun-1966, 46 y.o.   MRN: [id number]  HPI pt returns for f/u of type 2 DM (dx'ed 2006, complicated by peripheral sensory neuropathy).  She did not tolerate metformin (n/v).  no cbg record, but states cbg's are persistently in the high-200's.  Pt states few weeks of slight "lumps" at the face or neck, and assoc pain.  This started in the context of taking the parlodel.  She stopped it, and sxs resolved.  She says the parlodel did help her cbg's.   Past Medical History  Diagnosis Date  . Syncope     poss conversion disorder vs psychogenic seizures  . DM (diabetes mellitus)   . Hypothyroidism   . LBP (low back pain)     DR Noel Gerold  . Peripheral neuropathy   . COPD (chronic obstructive pulmonary disease)   . Vitamin D deficiency   . Migraine   . Hyperlipidemia   . Anxiety   . Gout     possible  . Tachycardia     diet coke overuse?  . TTS (tarsal tunnel syndrome) 2010    Left Foot [facility] in [place]    Past Surgical History  Procedure Date  . Cervical fusion     C4-5  . Abdominal hysterectomy   . Carpal tunnel release 2010    LEFT    History   Social History  . Marital Status: Married    Spouse Name: N/A    Number of Children: N/A  . Years of Education: N/A   Occupational History  . Not on file.   Social History Main Topics  . Smoking status: Former Smoker    Quit date: 12/01/2009  . Smokeless tobacco: Not on file  . Alcohol Use: No  . Drug Use: No  . Sexually Active: Yes   Other Topics Concern  . Not on file   Social History Narrative   GYN - Dr Cherie Dark cancerMother w/alcoholismMarried 3d GS due NovDiet Coke: 10 cans a dayRegular exercise - NOFormer smoker 2009 restared 2010 stopped 12/2009    Current Outpatient Prescriptions on File Prior to Visit  Medication Sig Dispense Refill  . amoxicillin-clavulanate (AUGMENTIN) 875-125 MG per tablet Take 1 tablet by mouth 2 (two) times daily.  20  tablet  0  . Cholecalciferol (VITAMIN D3) 1000 UNIT tablet Take 2,000 Units by mouth daily.        Marland Kitchen estradiol (ESTRACE) 1 MG tablet Take 1 mg by mouth daily.        Marland Kitchen FLEXERIL 5 MG tablet TAKE 1 OR 2 TABLETS BY MOUTH TWICE DAILY AS NEEDED FOR MUSCLE SPASMS  100 each  0  . fluticasone (FLONASE) 50 MCG/ACT nasal spray Place 2 sprays into the nose daily.  16 g  6  . gabapentin (NEURONTIN) 600 MG tablet Take 1 tablet (600 mg total) by mouth 3 (three) times daily.  120 tablet  5  . HYDROcodone-acetaminophen (NORCO) 10-325 MG per tablet Take 1 tablet by mouth 4 (four) times daily as needed. For PAIN  120 tablet  1  . insulin lispro (HUMALOG KWIKPEN) 100 UNIT/ML injection Inject 5 Units into the skin 3 (three) times daily before meals. And pen needles 3/day      . lamoTRIgine (LAMICTAL) 25 MG tablet Take 1 tablet (25 mg total) by mouth 2 (two) times daily.  120 tablet  1  . loratadine (CLARITIN) 10 MG tablet Take 1 tablet (10 mg  total) by mouth daily.  100 tablet  3  . mupirocin ointment (BACTROBAN) 2 % Use bid  15 g  0  . rosuvastatin (CRESTOR) 10 MG tablet Take 1 tablet (10 mg total) by mouth daily.  90 tablet  3  . SYNTHROID 75 MCG tablet TAKE 1 TABLET BY MOUTH ONCE DAILY  30 each  5  . vitamin B-12 (CYANOCOBALAMIN) 1000 MCG tablet Take 1,000 mcg by mouth daily.        Marland Kitchen zolpidem (AMBIEN) 10 MG tablet Take 0.5-1 tablets (5-10 mg total) by mouth at bedtime as needed. For insomnia  30 tablet  3    Allergies  Allergen Reactions  . Crestor (Rosuvastatin Calcium)     myalgia  . Lovastatin   . Meperidine Hcl   . Metformin Nausea And Vomiting  . Niacin   . Paroxetine    Family History  Problem Relation Age of Onset  . Diabetes Mother   . Hypertension Mother   . Cancer Father 81    ? bone cancer   BP 110/72  Pulse 94  Temp 97.8 F (36.6 C) (Oral)  Ht 5\' 9"  (1.753 m)  Wt 245 lb (111.131 kg)  BMI 36.18 kg/m2  SpO2 97%  Review of Systems denies hypoglycemia    Objective:   Physical  Exam VITAL SIGNS:  See vs page GENERAL: no distress Skin of the face is normal today.      Assessment & Plan:  DM.  She needs insulin.  i demonstrated an insulin pen Facial rash, ? Due to parlodel

## 2012-06-26 ENCOUNTER — Other Ambulatory Visit: Payer: Self-pay | Admitting: Internal Medicine

## 2012-06-27 NOTE — Telephone Encounter (Signed)
Ok to Rf? 

## 2012-06-30 ENCOUNTER — Other Ambulatory Visit: Payer: Self-pay | Admitting: Internal Medicine

## 2012-07-03 NOTE — Telephone Encounter (Signed)
Ok to Rf? 

## 2012-07-04 ENCOUNTER — Ambulatory Visit: Payer: Medicare PPO | Admitting: Internal Medicine

## 2012-07-04 NOTE — Telephone Encounter (Signed)
Please advise on Rf. Thanks 

## 2012-07-04 NOTE — Telephone Encounter (Signed)
Please advise on Rf. Thanks

## 2012-07-05 ENCOUNTER — Telehealth: Payer: Self-pay | Admitting: *Deleted

## 2012-07-05 NOTE — Telephone Encounter (Signed)
I approved Norco. She should not be using both Thx

## 2012-07-05 NOTE — Telephone Encounter (Signed)
Left msg on triage requesting to speak with nurse. Wanting to know why pain med was denied...Raechel Chute

## 2012-07-05 NOTE — Telephone Encounter (Signed)
Please advise on Rf for Tramadol and Norco. Pharmacy states these were denied. Please advise.

## 2012-07-06 NOTE — Telephone Encounter (Signed)
Norco phoned in.

## 2012-07-09 ENCOUNTER — Encounter: Payer: Self-pay | Admitting: Endocrinology

## 2012-07-09 ENCOUNTER — Ambulatory Visit (INDEPENDENT_AMBULATORY_CARE_PROVIDER_SITE_OTHER): Payer: Medicare PPO | Admitting: Endocrinology

## 2012-07-09 VITALS — BP 110/72 | HR 105 | Temp 96.8°F | Ht 69.0 in | Wt 243.0 lb

## 2012-07-09 DIAGNOSIS — E1142 Type 2 diabetes mellitus with diabetic polyneuropathy: Secondary | ICD-10-CM

## 2012-07-09 DIAGNOSIS — E1149 Type 2 diabetes mellitus with other diabetic neurological complication: Secondary | ICD-10-CM

## 2012-07-09 DIAGNOSIS — E1140 Type 2 diabetes mellitus with diabetic neuropathy, unspecified: Secondary | ICD-10-CM

## 2012-07-09 MED ORDER — INSULIN LISPRO 100 UNIT/ML ~~LOC~~ SOLN: 8.0000 [IU] | Freq: Three times a day (TID) | SUBCUTANEOUS | Status: DC

## 2012-07-09 MED ORDER — GABAPENTIN 600 MG PO TABS: 600.0000 mg | ORAL_TABLET | Freq: Three times a day (TID) | ORAL | Status: DC

## 2012-07-09 NOTE — Patient Instructions (Addendum)
check your blood sugar once a day.  vary the time of day when you check, between before the 3 meals, and at bedtime.  also check if you have symptoms of your blood sugar being too high or too low.  please keep a record of the readings and bring it to your next appointment here.  please call us sooner if your blood sugar goes below 70, or if it stays over 200.  please come back for a follow-up appointment in 4 weeks.  increase humalog to 8 units 3x a day (just before each meal).  Here are some more samples.

## 2012-07-09 NOTE — Progress Notes (Signed)
Subjective:    Patient ID: Katherine Herrera, female    DOB: 07-06-1966, 46 y.o.   MRN: [id number]  HPI pt returns for f/u of type 2 DM (dx'ed 2006, complicated by peripheral sensory neuropathy.  She did not tolerate metformin (n/v)).  no cbg record, but states cbg's vary from 130-300, but most are in the mid-100's.  It is in general higher as the day goes on. Past Medical History  Diagnosis Date  . Syncope     poss conversion disorder vs psychogenic seizures  . DM (diabetes mellitus)   . Hypothyroidism   . LBP (low back pain)     DR Noel Gerold  . Peripheral neuropathy   . COPD (chronic obstructive pulmonary disease)   . Vitamin D deficiency   . Migraine   . Hyperlipidemia   . Anxiety   . Gout     possible  . Tachycardia     diet coke overuse?  . TTS (tarsal tunnel syndrome) 2010    Left Foot [facility] in [place]    Past Surgical History  Procedure Date  . Cervical fusion     C4-5  . Abdominal hysterectomy   . Carpal tunnel release 2010    LEFT    History   Social History  . Marital Status: Married    Spouse Name: N/A    Number of Children: N/A  . Years of Education: N/A   Occupational History  . Not on file.   Social History Main Topics  . Smoking status: Former Smoker    Quit date: 12/01/2009  . Smokeless tobacco: Not on file  . Alcohol Use: No  . Drug Use: No  . Sexually Active: Yes   Other Topics Concern  . Not on file   Social History Narrative   GYN - Dr Cherie Dark cancerMother w/alcoholismMarried 3d GS due NovDiet Coke: 10 cans a dayRegular exercise - NOFormer smoker 2009 restared 2010 stopped 12/2009    Current Outpatient Prescriptions on File Prior to Visit  Medication Sig Dispense Refill  . Cholecalciferol (VITAMIN D3) 1000 UNIT tablet Take 2,000 Units by mouth daily.        Marland Kitchen estradiol (ESTRACE) 1 MG tablet Take 1 mg by mouth daily.        . fluticasone (FLONASE) 50 MCG/ACT nasal spray Place 2 sprays into the nose daily.  16 g  6  .  gabapentin (NEURONTIN) 600 MG tablet Take 1 tablet (600 mg total) by mouth 3 (three) times daily.  120 tablet  5  . insulin lispro (HUMALOG KWIKPEN) 100 UNIT/ML injection Inject 8 Units into the skin 3 (three) times daily before meals. And pen needles 3/day  15 mL  11  . lamoTRIgine (LAMICTAL) 25 MG tablet Take 1 tablet (25 mg total) by mouth 2 (two) times daily.  120 tablet  1  . loratadine (CLARITIN) 10 MG tablet Take 1 tablet (10 mg total) by mouth daily.  100 tablet  3  . NORCO 10-325 MG per tablet TAKE ONE TABLET BY MOUTH FOUR TIMES A DAY AS NEEDED FOR PAIN  120 each  0  . rosuvastatin (CRESTOR) 10 MG tablet Take 1 tablet (10 mg total) by mouth daily.  90 tablet  3  . SYNTHROID 75 MCG tablet TAKE 1 TABLET BY MOUTH ONCE DAILY  30 each  5  . vitamin B-12 (CYANOCOBALAMIN) 1000 MCG tablet Take 1,000 mcg by mouth daily.        Marland Kitchen zolpidem (AMBIEN) 10 MG tablet Take  0.5-1 tablets (5-10 mg total) by mouth at bedtime as needed. For insomnia  30 tablet  3  . FLEXERIL 5 MG tablet TAKE 1 OR 2 TABLETS BY MOUTH TWICE DAILY AS NEEDED FOR MUSCLE SPASMS  60 each  5    Allergies  Allergen Reactions  . Crestor (Rosuvastatin Calcium)     myalgia  . Lovastatin   . Meperidine Hcl   . Metformin Nausea And Vomiting  . Niacin   . Paroxetine     Family History  Problem Relation Age of Onset  . Diabetes Mother   . Hypertension Mother   . Cancer Father 26    ? bone cancer    BP 110/72  Pulse 105  Temp 96.8 F (36 C) (Oral)  Ht 5\' 9"  (1.753 m)  Wt 243 lb (110.224 kg)  BMI 35.88 kg/m2  SpO2 97%  Review of Systems denies hypoglycemia.    Objective:   Physical Exam VITAL SIGNS:  See vs page GENERAL: no distress SKIN:  Insulin injection sites at the anterior abdomen are normal     Assessment & Plan:  DM, needs increased rx

## 2012-07-10 ENCOUNTER — Other Ambulatory Visit: Payer: Self-pay | Admitting: Internal Medicine

## 2012-07-10 NOTE — Telephone Encounter (Signed)
Ok to Rf in PCP absence? 

## 2012-07-12 ENCOUNTER — Telehealth: Payer: Self-pay

## 2012-07-12 MED ORDER — FLUCONAZOLE 150 MG PO TABS: 150.0000 mg | ORAL_TABLET | Freq: Once | ORAL | Status: AC

## 2012-07-12 NOTE — Telephone Encounter (Signed)
Pt called requesting Rx for Diflucan, please advise 

## 2012-07-12 NOTE — Telephone Encounter (Signed)
sent 

## 2012-07-12 NOTE — Telephone Encounter (Signed)
Pt informed rx sent to Pharmacy.

## 2012-07-30 ENCOUNTER — Other Ambulatory Visit: Payer: Self-pay | Admitting: Internal Medicine

## 2012-08-13 ENCOUNTER — Ambulatory Visit (INDEPENDENT_AMBULATORY_CARE_PROVIDER_SITE_OTHER): Payer: Medicare PPO | Admitting: Endocrinology

## 2012-08-13 ENCOUNTER — Encounter: Payer: Self-pay | Admitting: Endocrinology

## 2012-08-13 VITALS — BP 124/82 | HR 102 | Temp 97.6°F | Wt 231.0 lb

## 2012-08-13 DIAGNOSIS — E1140 Type 2 diabetes mellitus with diabetic neuropathy, unspecified: Secondary | ICD-10-CM

## 2012-08-13 DIAGNOSIS — E1149 Type 2 diabetes mellitus with other diabetic neurological complication: Secondary | ICD-10-CM

## 2012-08-13 DIAGNOSIS — E1142 Type 2 diabetes mellitus with diabetic polyneuropathy: Secondary | ICD-10-CM

## 2012-08-13 NOTE — Progress Notes (Signed)
Subjective:    Patient ID: Katherine Herrera, female    DOB: [birth year], 46 y.o.   MRN: [id number]  HPI pt returns for f/u of type 2 DM (dx'ed 2006, complicated by peripheral sensory neuropathy.  She did not tolerate metformin (n/v)).  no cbg record.  Pt says she checks cbg's in am and at hs only.  It varies from 150-300's.  It is in general higher in am that at hs, despite no hs snack.  She is hesitant to add an additional type of insulin.   Past Medical History  Diagnosis Date  . Syncope     poss conversion disorder vs psychogenic seizures  . DM (diabetes mellitus)   . Hypothyroidism   . LBP (low back pain)     DR Noel Gerold  . Peripheral neuropathy   . COPD (chronic obstructive pulmonary disease)   . Vitamin D deficiency   . Migraine   . Hyperlipidemia   . Anxiety   . Gout     possible  . Tachycardia     diet coke overuse?  . TTS (tarsal tunnel syndrome) 2010    Left Foot [facility] in [place]    Past Surgical History  Procedure Date  . Cervical fusion     C4-5  . Abdominal hysterectomy   . Carpal tunnel release 2010    LEFT    History   Social History  . Marital Status: Married    Spouse Name: N/A    Number of Children: N/A  . Years of Education: N/A   Occupational History  . Not on file.   Social History Main Topics  . Smoking status: Former Smoker    Quit date: 12/01/2009  . Smokeless tobacco: Not on file  . Alcohol Use: No  . Drug Use: No  . Sexually Active: Yes   Other Topics Concern  . Not on file   Social History Narrative   GYN - Dr Cherie Dark cancerMother w/alcoholismMarried 3d GS due NovDiet Coke: 10 cans a dayRegular exercise - NOFormer smoker 2009 restared 2010 stopped 12/2009    Current Outpatient Prescriptions on File Prior to Visit  Medication Sig Dispense Refill  . Cholecalciferol (VITAMIN D3) 1000 UNIT tablet Take 2,000 Units by mouth daily.        Marland Kitchen estradiol (ESTRACE) 1 MG tablet Take 1 mg by mouth daily.        Marland Kitchen FLEXERIL 5 MG  tablet TAKE 1 OR 2 TABLETS BY MOUTH TWICE DAILY AS NEEDED FOR MUSCLE SPASMS  60 each  5  . fluticasone (FLONASE) 50 MCG/ACT nasal spray Place 2 sprays into the nose daily.  16 g  6  . gabapentin (NEURONTIN) 600 MG tablet Take 1 tablet (600 mg total) by mouth 3 (three) times daily.  120 tablet  5  . insulin lispro (HUMALOG) 100 UNIT/ML injection Inject 10 Units into the skin 3 (three) times daily before meals. And pen needles 3/day      . LAMICTAL 25 MG tablet TAKE 1 TABLET BY MOUTH TWICE A DAY.  60 tablet  5  . loratadine (CLARITIN) 10 MG tablet Take 1 tablet (10 mg total) by mouth daily.  100 tablet  3  . NORCO 10-325 MG per tablet TAKE ONE TABLET BY MOUTH FOUR TIMES A DAY AS NEEDED FOR PAIN  120 each  0  . rosuvastatin (CRESTOR) 10 MG tablet Take 1 tablet (10 mg total) by mouth daily.  90 tablet  3  . SYNTHROID 75 MCG tablet  TAKE 1 TABLET BY MOUTH ONCE DAILY  30 each  5  . vitamin B-12 (CYANOCOBALAMIN) 1000 MCG tablet Take 1,000 mcg by mouth daily.        Marland Kitchen zolpidem (AMBIEN) 10 MG tablet Take 0.5-1 tablets (5-10 mg total) by mouth at bedtime as needed. For insomnia  30 tablet  3    Allergies  Allergen Reactions  . Crestor (Rosuvastatin Calcium)     myalgia  . Lovastatin   . Meperidine Hcl   . Metformin Nausea And Vomiting  . Niacin   . Paroxetine     Family History  Problem Relation Age of Onset  . Diabetes Mother   . Hypertension Mother   . Cancer Father 67    ? bone cancer   BP 124/82  Pulse 102  Temp 97.6 F (36.4 C) (Oral)  Wt 231 lb (104.781 kg)  SpO2 96% Review of Systems denies hypoglycemia    Objective:   Physical Exam VITAL SIGNS:  See vs page GENERAL: no distress PSYCH: Alert and oriented x 3.  Does not appear anxious nor depressed.     Assessment & Plan:  DM: needs increased rx.  She may need to add intermediate or long-acting insulin at hs

## 2012-08-13 NOTE — Patient Instructions (Addendum)
check your blood sugar once a day.  vary the time of day when you check, between before the 3 meals, and at bedtime.  also check if you have symptoms of your blood sugar being too high or too low.  please keep a record of the readings and bring it to your next appointment here.  please call us sooner if your blood sugar goes below 70, or if it stays over 200.  please come back for a follow-up appointment in 4 weeks.   increase humalog to 10 units 3x a day (just before each meal).  If all of your blood sugar readings stay over 140 on this, please increase to 12 units 3 times a day (just before each meal).  Here are some more samples.   If your blood sugar stays highest in the morning (despite not eating at bedtime), we'll have to add another insulin.

## 2012-08-14 ENCOUNTER — Telehealth: Payer: Self-pay | Admitting: Endocrinology

## 2012-08-14 MED ORDER — BACLOFEN 10 MG PO TABS: 10.0000 mg | ORAL_TABLET | Freq: Two times a day (BID) | ORAL | Status: DC | PRN

## 2012-08-14 NOTE — Telephone Encounter (Signed)
Ok to try Baclofen instead Thx

## 2012-08-14 NOTE — Telephone Encounter (Signed)
Please ask dr Posey Rea, as i just take care of the DM

## 2012-08-14 NOTE — Telephone Encounter (Signed)
Pt's insurance reports that Flexoril is not covered. They suggested baclofen as a substitute. Pt is aware that Dr. Posey Rea is prescribing physician but still wants to see if Dr. Everardo All can begin prescribing instead. Please advise.

## 2012-08-20 ENCOUNTER — Ambulatory Visit: Payer: Medicare PPO | Admitting: Dietician

## 2012-08-22 ENCOUNTER — Other Ambulatory Visit: Payer: Self-pay | Admitting: Internal Medicine

## 2012-08-23 NOTE — Telephone Encounter (Signed)
Ok to Rf? 

## 2012-08-28 NOTE — Telephone Encounter (Signed)
Ok to Rf? 

## 2012-08-30 ENCOUNTER — Telehealth: Payer: Self-pay | Admitting: *Deleted

## 2012-08-30 NOTE — Telephone Encounter (Signed)
Pt called questioning why she was denied being able to get diabetic shoes by Dr. Everardo All. Pt states she has been getting them for years. Pt received last diabetic shoes on 12/27/10 per Dr. Posey Rea. Please advise.

## 2012-08-30 NOTE — Telephone Encounter (Signed)
Medicare is getting more strict enforcing the criteria for shoes.

## 2012-08-31 ENCOUNTER — Telehealth: Payer: Self-pay | Admitting: *Deleted

## 2012-08-31 NOTE — Telephone Encounter (Signed)
Spoke with pt and told her what Dr. Everardo All said, Medicare is getting more strict enforcing the criteria for shoes. Advised pt that she should discuss the criteria at her next appt with Dr. Everardo All on 11/13. Pt agreed.

## 2012-09-04 ENCOUNTER — Telehealth: Payer: Self-pay | Admitting: *Deleted

## 2012-09-04 ENCOUNTER — Encounter: Payer: Medicare PPO | Attending: Endocrinology | Admitting: Dietician

## 2012-09-04 ENCOUNTER — Encounter: Payer: Self-pay | Admitting: Dietician

## 2012-09-04 DIAGNOSIS — E1140 Type 2 diabetes mellitus with diabetic neuropathy, unspecified: Secondary | ICD-10-CM

## 2012-09-04 NOTE — Telephone Encounter (Signed)
i did ref

## 2012-09-04 NOTE — Telephone Encounter (Signed)
I will advise pt.  Pt also wants to know the name of the foot dr you recommend for her to see for her toenails? Please advise.

## 2012-09-04 NOTE — Telephone Encounter (Signed)
Pt came by the office requesting help to get her diabetic shoes. Pt did not remember the phone call from last week about this. Pt feels she meets at least 3 of the 5 criteria for the shoes due to her previous problems with her feet. Also, pt wants to know the name of the foot dr you recommend for her to see for her toenails? Please advise.

## 2012-09-04 NOTE — Telephone Encounter (Signed)
Medicare has certain criteria, and fortunately for your health, you don't have any of these.

## 2012-09-05 NOTE — Telephone Encounter (Signed)
Called pt and told her per Dr Everardo All that Medicate has certain criteria, and fortunately for her health, you don't any of these. Also told pt that the podiatry referral had been made and she should hear something soon. Pt will discuss criteria with Dr Everardo All on her next office visit.

## 2012-09-05 NOTE — Progress Notes (Signed)
  Patient was seen on 09/04/2012 for their 3 month follow-up as a part of the diabetes self-management courses at the [facility]. The following learning objectives were met by your patient during this course:  Patient self reports the following:  Diabetes control has improved since diabetes self-management training: yes, while blood glucose levels remain elevated, she feels that she has more knowledge of why it maybe up and approaches for trying to bring it down. Number of days blood glucose is >200: Most days it is in the 200-300 range.  Currently on Humalog.  Reports MD is considering adding another insulin. Last MD appointment for diabetes: October 2013, aand is to see Dr. Everardo All again on 09/12/2012 Changes in treatment plan: Started rapid acting insulin at 12 units AC meals three times a day. Confidence with ability to manage diabetes: States she feels for confident dealing with her diabetes Areas for improvement with diabetes self-care: Wants to lose more weight and to get her blood glucose at more normal levesl Willingness to participate in diabetes support group: Unsure of participation.  Has free swim time on Monday night.  Please see Diabetes Flow sheet for findings related to patient's self-care.  Follow-Up Plan: Patient is eligible for a "free" 30 minute diabetes self-care appointment in the next year. Patient to call and schedule as needed.

## 2012-09-10 ENCOUNTER — Ambulatory Visit (INDEPENDENT_AMBULATORY_CARE_PROVIDER_SITE_OTHER): Payer: Medicare PPO | Admitting: Internal Medicine

## 2012-09-10 ENCOUNTER — Encounter: Payer: Self-pay | Admitting: Internal Medicine

## 2012-09-10 VITALS — BP 130/72 | HR 76 | Temp 97.2°F | Resp 16 | Wt 234.0 lb

## 2012-09-10 DIAGNOSIS — R609 Edema, unspecified: Secondary | ICD-10-CM

## 2012-09-10 DIAGNOSIS — E039 Hypothyroidism, unspecified: Secondary | ICD-10-CM

## 2012-09-10 DIAGNOSIS — G609 Hereditary and idiopathic neuropathy, unspecified: Secondary | ICD-10-CM

## 2012-09-10 DIAGNOSIS — M545 Low back pain, unspecified: Secondary | ICD-10-CM

## 2012-09-10 DIAGNOSIS — E1142 Type 2 diabetes mellitus with diabetic polyneuropathy: Secondary | ICD-10-CM

## 2012-09-10 DIAGNOSIS — M2550 Pain in unspecified joint: Secondary | ICD-10-CM

## 2012-09-10 DIAGNOSIS — E538 Deficiency of other specified B group vitamins: Secondary | ICD-10-CM

## 2012-09-10 DIAGNOSIS — E1149 Type 2 diabetes mellitus with other diabetic neurological complication: Secondary | ICD-10-CM

## 2012-09-10 DIAGNOSIS — E1140 Type 2 diabetes mellitus with diabetic neuropathy, unspecified: Secondary | ICD-10-CM

## 2012-09-10 MED ORDER — HYDROCODONE-ACETAMINOPHEN 10-325 MG PO TABS: 1.0000 | ORAL_TABLET | Freq: Four times a day (QID) | ORAL | Status: DC | PRN

## 2012-09-10 MED ORDER — TRAMADOL HCL 50 MG PO TABS: 50.0000 mg | ORAL_TABLET | Freq: Two times a day (BID) | ORAL | Status: DC | PRN

## 2012-09-10 NOTE — Assessment & Plan Note (Signed)
Continue with current prescription therapy as reflected on the Med list.  

## 2012-09-10 NOTE — Progress Notes (Signed)
  Subjective:    Patient ID: Katherine Herrera, female    DOB: 1966-04-24, 46 y.o.   MRN: [id number]  HPI  The patient presents for a follow-up of  chronic hypertension, chronic dyslipidemia, type 2 diabetes and FMS - asking to increase Gabapentin CBGs are high: work in progress w/Dr Everardo All Vicodin is helping w/LBP Tramadol is helping w/HA - not using together  Wt Readings from Last 3 Encounters:  09/10/12 234 lb (106.142 kg)  09/04/12 237 lb (107.502 kg)  08/13/12 231 lb (104.781 kg)   BP Readings from Last 3 Encounters:  09/10/12 130/72  08/13/12 124/82  07/09/12 110/72      Review of Systems  Constitutional: Negative for chills, activity change, appetite change, fatigue and unexpected weight change.  HENT: Negative for congestion, mouth sores and sinus pressure.   Eyes: Negative for visual disturbance.  Respiratory: Negative for cough and chest tightness.   Gastrointestinal: Negative for nausea and abdominal pain.  Genitourinary: Negative for frequency, difficulty urinating and vaginal pain.  Musculoskeletal: Positive for arthralgias. Negative for back pain and gait problem.  Skin: Negative for pallor and rash.  Neurological: Negative for dizziness, tremors, weakness, numbness and headaches.  Psychiatric/Behavioral: Negative for suicidal ideas, confusion and sleep disturbance. The patient is nervous/anxious.        Objective:   Physical Exam  Constitutional: She appears well-developed. No distress.  HENT:  Head: Normocephalic.  Right Ear: External ear normal.  Left Ear: External ear normal.  Nose: Nose normal.  Mouth/Throat: Oropharynx is clear and moist.  Eyes: Conjunctivae normal are normal. Pupils are equal, round, and reactive to light. Right eye exhibits no discharge. Left eye exhibits no discharge.  Neck: Normal range of motion. Neck supple. No JVD present. No tracheal deviation present. No thyromegaly present.  Cardiovascular: Normal rate, regular rhythm and  normal heart sounds.   Pulmonary/Chest: No stridor. No respiratory distress. She has no wheezes.  Abdominal: Soft. Bowel sounds are normal. She exhibits no distension and no mass. There is no tenderness. There is no rebound and no guarding.  Musculoskeletal: She exhibits tenderness (LS spine is tender, leg muscles hurt). She exhibits no edema.  Lymphadenopathy:    She has no cervical adenopathy.  Neurological: She displays normal reflexes. No cranial nerve deficit. She exhibits normal muscle tone. Coordination normal.  Skin: No rash noted. No erythema.  Psychiatric: She has a normal mood and affect. Her behavior is normal. Judgment and thought content normal.     Lab Results  Component Value Date   WBC 9.9 10/18/2011   HGB 14.1 10/18/2011   HCT 40.8 10/18/2011   PLT 257.0 10/18/2011   GLUCOSE 224* 03/19/2012   CHOL 265* 03/19/2012   TRIG 422.0* 03/19/2012   HDL 36.20* 03/19/2012   LDLDIRECT 160.5 03/19/2012   ALT 21 10/18/2011   AST 19 10/18/2011   NA 135 03/19/2012   K 4.2 03/19/2012   CL 98 03/19/2012   CREATININE 0.8 03/19/2012   BUN 15 03/19/2012   CO2 29 03/19/2012   TSH 1.74 03/19/2012   HGBA1C 8.7* 03/19/2012        Assessment & Plan:

## 2012-09-10 NOTE — Assessment & Plan Note (Signed)
Vicodin is helping w/LBP Tramadol is helping w/HA - not using together

## 2012-09-10 NOTE — Assessment & Plan Note (Signed)
[employer] classes for FMS

## 2012-09-11 ENCOUNTER — Other Ambulatory Visit: Payer: Self-pay | Admitting: Internal Medicine

## 2012-09-12 ENCOUNTER — Ambulatory Visit (INDEPENDENT_AMBULATORY_CARE_PROVIDER_SITE_OTHER): Payer: Medicare PPO | Admitting: Endocrinology

## 2012-09-12 ENCOUNTER — Encounter: Payer: Self-pay | Admitting: Endocrinology

## 2012-09-12 ENCOUNTER — Ambulatory Visit
Admission: RE | Admit: 2012-09-12 | Discharge: 2012-09-12 | Disposition: A | Discharge status: Home / Self Care | Payer: Medicare PPO | Source: Ambulatory Visit | Attending: Endocrinology | Admitting: Endocrinology

## 2012-09-12 ENCOUNTER — Telehealth: Payer: Self-pay

## 2012-09-12 VITALS — BP 126/78 | HR 83 | Wt 238.0 lb

## 2012-09-12 DIAGNOSIS — M79673 Pain in unspecified foot: Secondary | ICD-10-CM

## 2012-09-12 DIAGNOSIS — M79609 Pain in unspecified limb: Secondary | ICD-10-CM

## 2012-09-12 MED ORDER — ZOLPIDEM TARTRATE 10 MG PO TABS: 5.0000 mg | ORAL_TABLET | Freq: Every evening | ORAL | Status: DC | PRN

## 2012-09-12 NOTE — Telephone Encounter (Signed)
OK to fill this 90 d prescription with additional refills x1 Thank you!  

## 2012-09-12 NOTE — Telephone Encounter (Signed)
Rx printed/faxed to [facility]

## 2012-09-12 NOTE — Progress Notes (Signed)
Subjective:    Patient ID: Katherine Herrera, female    DOB: [birth year], 46 y.o.   MRN: [id number]  HPI pt returns for f/u of type 2 DM (dx'ed 2006, complicated by peripheral sensory neuropathy.  She did not tolerate metformin (n/v)).  no cbg record.  no cbg record, but states cbg's vary from 200-300's. There is no trend throughout the day, but she only checks cbg at hs, and in am.   She had an episode of weakness last week, when she was at water aerobics.  She was taken by ambulance to ER, but she does not know if it was low.  She has not had any other episodes of hypoglycemia, even with activity.  Pt states few years of moderate numbness of the feet, and assoc pain. She had foot surgery for what pt describes as tarsal tunnel syndrome (approx 2009).   Past Medical History  Diagnosis Date  . Syncope     poss conversion disorder vs psychogenic seizures  . DM (diabetes mellitus)   . Hypothyroidism   . LBP (low back pain)     DR Noel Gerold  . Peripheral neuropathy   . COPD (chronic obstructive pulmonary disease)   . Vitamin D deficiency   . Migraine   . Hyperlipidemia   . Anxiety   . Gout     possible  . Tachycardia     diet coke overuse?  . TTS (tarsal tunnel syndrome) 2010    Left Foot [facility] in [place]    Past Surgical History  Procedure Date  . Cervical fusion     C4-5  . Abdominal hysterectomy   . Carpal tunnel release 2010    LEFT    History   Social History  . Marital Status: Married    Spouse Name: N/A    Number of Children: N/A  . Years of Education: N/A   Occupational History  . Not on file.   Social History Main Topics  . Smoking status: Former Smoker    Quit date: 12/01/2009  . Smokeless tobacco: Not on file  . Alcohol Use: No  . Drug Use: No  . Sexually Active: Yes   Other Topics Concern  . Not on file   Social History Narrative   GYN - Dr Cherie Dark cancerMother w/alcoholismMarried 3d GS due NovDiet Coke: 10 cans a dayRegular exercise -  NOFormer smoker 2009 restared 2010 stopped 12/2009    Current Outpatient Prescriptions on File Prior to Visit  Medication Sig Dispense Refill  . baclofen (LIORESAL) 10 MG tablet Take 1 tablet (10 mg total) by mouth 2 (two) times daily as needed (spasms, cramps).  60 each  3  . Cholecalciferol (VITAMIN D3) 1000 UNIT tablet Take 2,000 Units by mouth daily.        Marland Kitchen estradiol (ESTRACE) 1 MG tablet Take 1 mg by mouth daily.        . fluticasone (FLONASE) 50 MCG/ACT nasal spray Place 2 sprays into the nose daily.  16 g  6  . gabapentin (NEURONTIN) 600 MG tablet Take 1 tablet (600 mg total) by mouth 3 (three) times daily.  120 tablet  5  . HYDROcodone-acetaminophen (NORCO) 10-325 MG per tablet Take 1 tablet by mouth every 6 (six) hours as needed for pain.  120 tablet  2  . insulin lispro (HUMALOG) 100 UNIT/ML injection Inject 16 Units into the skin 3 (three) times daily before meals. And pen needles 3/day      . LAMICTAL 25 MG  tablet TAKE 1 TABLET BY MOUTH TWICE A DAY.  60 tablet  5  . loratadine (CLARITIN) 10 MG tablet Take 1 tablet (10 mg total) by mouth daily.  100 tablet  3  . rosuvastatin (CRESTOR) 10 MG tablet Take 1 tablet (10 mg total) by mouth daily.  90 tablet  3  . SYNTHROID 75 MCG tablet TAKE 1 TABLET BY MOUTH ONCE DAILY  30 each  5  . traMADol (ULTRAM) 50 MG tablet Take 1-2 tablets (50-100 mg total) by mouth 2 (two) times daily as needed (headache).  60 tablet  2  . vitamin B-12 (CYANOCOBALAMIN) 1000 MCG tablet Take 1,000 mcg by mouth daily.        Marland Kitchen zolpidem (AMBIEN) 10 MG tablet Take 0.5-1 tablets (5-10 mg total) by mouth at bedtime as needed. For insomnia  90 tablet  1    Allergies  Allergen Reactions  . Crestor (Rosuvastatin Calcium)     myalgia  . Lovastatin   . Meperidine Hcl   . Metformin Nausea And Vomiting  . Niacin   . Paroxetine     Family History  Problem Relation Age of Onset  . Diabetes Mother   . Hypertension Mother   . Cancer Father 79    ? bone cancer     BP 126/78  Pulse 83  Wt 238 lb (107.956 kg)  SpO2 95%  Review of Systems Denies LOC and foot ulcer.       Objective:   Physical Exam VITAL SIGNS:  See vs page GENERAL: no distress Pulses: dorsalis pedis intact bilat, but decreased from normal.   Feet: no deformity.  no ulcer on the feet.  feet are of normal color and temp.  no edema.  Old healed surgical scar, between the left lateral malleolus and the achilles tendon.   Neuro: sensation is intact to touch on the feet, but decreased from normal.    outside test results are reviewed:  i reviewed ER records.  No field glucose was done, but hypoglycemia is presumed (i reviewed x-ray report: OA)    Assessment & Plan:  DM: therapy limited by noncompliance with cbg monitoring.  i'll do the best i can. Foot pain, neuropathic vs OA, new

## 2012-09-12 NOTE — Patient Instructions (Addendum)
check your blood sugar twice a day.  vary the time of day when you check, between before the 3 meals, and at bedtime.  also check if you have symptoms of your blood sugar being too high or too low.  please keep a record of the readings and bring it to your next appointment here.  please call us sooner if your blood sugar goes below 70, or if it stays over 200.  please come back for a follow-up appointment in 4 weeks.   increase humalog to 16 units 3x a day (just before each meal).  However, if you are going to be active, take only 5 units.  If your blood sugar stays highest in the morning (despite not eating at bedtime), we'll have to add another insulin.   Please sign a release of information for the 09/05/12 ER visit at [facility].   X-rays are being requested for you today.  We'll contact you with results.  Let's check a circulation test of the feet.  If this shows circulation problems, i would be happy to do the form for inserts.

## 2012-09-12 NOTE — Telephone Encounter (Signed)
Please advis if ok to refill zolpidem for this pt to [facility]  Mail order. Thanks, last filled 2012

## 2012-09-13 ENCOUNTER — Telehealth: Payer: Self-pay | Admitting: *Deleted

## 2012-09-13 LAB — HEMOGLOBIN A1C
Hgb A1c MFr Bld: 10.7 % — ABNORMAL HIGH (ref <5.7)
Mean Plasma Glucose: 260 mg/dL — ABNORMAL HIGH (ref <117)

## 2012-09-13 NOTE — Telephone Encounter (Signed)
Patient notified of x-ray report normal and to continue insulin as discussed with Dr Everardo All.

## 2012-09-13 NOTE — Telephone Encounter (Signed)
Message copied by Elnora Morrison on Thu Sep 13, 2012  4:21 PM ------      Message from: Romero Belling      Created: Thu Sep 13, 2012  6:45 AM       please call patient:      X-ray is normal except for mild arthritis      Blood sugar is high.  Please increase insulin as we discussed

## 2012-09-24 ENCOUNTER — Encounter (INDEPENDENT_AMBULATORY_CARE_PROVIDER_SITE_OTHER): Payer: Medicare PPO

## 2012-09-24 ENCOUNTER — Telehealth: Payer: Self-pay | Admitting: Internal Medicine

## 2012-09-24 ENCOUNTER — Telehealth: Payer: Self-pay

## 2012-09-24 DIAGNOSIS — M79609 Pain in unspecified limb: Secondary | ICD-10-CM

## 2012-09-24 DIAGNOSIS — E1159 Type 2 diabetes mellitus with other circulatory complications: Secondary | ICD-10-CM

## 2012-09-24 DIAGNOSIS — M79673 Pain in unspecified foot: Secondary | ICD-10-CM

## 2012-09-24 MED ORDER — INSULIN GLARGINE 100 UNIT/ML ~~LOC~~ SOLN: 20.0000 [IU] | Freq: Every day | SUBCUTANEOUS | Status: DC

## 2012-09-24 NOTE — Telephone Encounter (Signed)
error 

## 2012-09-24 NOTE — Telephone Encounter (Signed)
Fax received from [facility]: pt call 3 days ago. Sugar was 359, blurred vision. Taking Novolog 16 units tid as per Dr. George Hugh directions. Called Dr. Patsy Lager and he advised her to take 5 units of Novolog then, and then increase dose to 18 units tid over the weekend and call Dr. Everardo All in am today. Dr. Everardo All is out this week. I called back pt at 10:30 am, when I saw the fax, but she was not available. I called back at 2 pm and she mentioned that her sugars stay in the 200s, lowest was 215 this am. I advised her to add Lantus 20 units qhs (she did discuss about this with Dr. Everardo All at last appt) and decrease her Novolog to 15 units tid (an absolute increase in daily dose of 11 units). I called in Lantus solostar pens at [facility] [phone]. I advised pt to call me in 2 days to let me know about her sugars and to check more often in these few days after adding Lantus. She agreed to plan.

## 2012-10-04 ENCOUNTER — Telehealth: Payer: Self-pay | Admitting: *Deleted

## 2012-10-04 ENCOUNTER — Other Ambulatory Visit: Payer: Self-pay | Admitting: *Deleted

## 2012-10-04 MED ORDER — LAMOTRIGINE 25 MG PO TABS: 25.0000 mg | ORAL_TABLET | Freq: Two times a day (BID) | ORAL | Status: DC

## 2012-10-04 MED ORDER — LEVOTHYROXINE SODIUM 75 MCG PO TABS: 75.0000 ug | ORAL_TABLET | Freq: Every day | ORAL | Status: DC

## 2012-10-04 MED ORDER — BACLOFEN 10 MG PO TABS: 10.0000 mg | ORAL_TABLET | Freq: Two times a day (BID) | ORAL | Status: DC | PRN

## 2012-10-04 NOTE — Telephone Encounter (Signed)
Rf req for Tramadol to be sent to [facility]. Please advise.

## 2012-10-05 MED ORDER — TRAMADOL HCL 50 MG PO TABS: 50.0000 mg | ORAL_TABLET | Freq: Two times a day (BID) | ORAL | Status: DC | PRN

## 2012-10-05 NOTE — Telephone Encounter (Signed)
Done

## 2012-10-05 NOTE — Telephone Encounter (Signed)
Ok Thx 

## 2012-10-10 ENCOUNTER — Ambulatory Visit: Payer: Medicare PPO | Admitting: Endocrinology

## 2012-10-13 ENCOUNTER — Encounter: Payer: Self-pay | Admitting: *Deleted

## 2012-10-13 DIAGNOSIS — Q667 Congenital pes cavus, unspecified foot: Secondary | ICD-10-CM

## 2012-10-13 DIAGNOSIS — M6700 Short Achilles tendon (acquired), unspecified ankle: Secondary | ICD-10-CM | POA: Insufficient documentation

## 2012-10-16 ENCOUNTER — Encounter: Payer: Self-pay | Admitting: Endocrinology

## 2012-10-16 ENCOUNTER — Ambulatory Visit (INDEPENDENT_AMBULATORY_CARE_PROVIDER_SITE_OTHER): Payer: Medicare PPO | Admitting: Endocrinology

## 2012-10-16 VITALS — BP 118/70 | HR 83 | Temp 97.5°F | Wt 243.0 lb

## 2012-10-16 DIAGNOSIS — E1142 Type 2 diabetes mellitus with diabetic polyneuropathy: Secondary | ICD-10-CM

## 2012-10-16 DIAGNOSIS — E1140 Type 2 diabetes mellitus with diabetic neuropathy, unspecified: Secondary | ICD-10-CM

## 2012-10-16 DIAGNOSIS — E1149 Type 2 diabetes mellitus with other diabetic neurological complication: Secondary | ICD-10-CM

## 2012-10-16 MED ORDER — GABAPENTIN 600 MG PO TABS: 600.0000 mg | ORAL_TABLET | Freq: Three times a day (TID) | ORAL | Status: DC

## 2012-10-16 MED ORDER — INSULIN LISPRO 100 UNIT/ML ~~LOC~~ SOLN: 20.0000 [IU] | Freq: Three times a day (TID) | SUBCUTANEOUS | Status: DC

## 2012-10-16 MED ORDER — INSULIN GLARGINE 100 UNIT/ML ~~LOC~~ SOLN: 20.0000 [IU] | Freq: Every day | SUBCUTANEOUS | Status: DC

## 2012-10-16 NOTE — Patient Instructions (Addendum)
check your blood sugar twice a day.  vary the time of day when you check, between before the 3 meals, and at bedtime.  also check if you have symptoms of your blood sugar being too high or too low.  please keep a record of the readings and bring it to your next appointment here.  please call us sooner if your blood sugar goes below 70, or if it stays over 200.   please come back for a follow-up appointment in 4 weeks.   increase humalog to 20 units 3x a day (just before each meal).  However, if you are going to be active, take only 5 units.

## 2012-10-16 NOTE — Progress Notes (Signed)
Subjective:    Patient ID: Katherine Herrera, female    DOB: February 02, 1966, 46 y.o.   MRN: [id number]  HPI pt returns for f/u of type 2 DM (dx'ed 2006, complicated by peripheral sensory neuropathy.  She did not tolerate metformin, due to n/v).  no cbg record, but states cbg's vary from 160-300's.  It is in general higher as the day goes on. pt states she feels well in general, except for fatigue.   Past Medical History  Diagnosis Date  . Syncope     poss conversion disorder vs psychogenic seizures  . DM (diabetes mellitus)   . Hypothyroidism   . LBP (low back pain)     DR Noel Gerold  . Peripheral neuropathy   . COPD (chronic obstructive pulmonary disease)   . Vitamin D deficiency   . Migraine   . Hyperlipidemia   . Anxiety   . Gout     possible  . Tachycardia     diet coke overuse?  . TTS (tarsal tunnel syndrome) 2010    Left Foot [facility] in [place]    Past Surgical History  Procedure Date  . Cervical fusion     C4-5  . Abdominal hysterectomy   . Carpal tunnel release 2010    LEFT    History   Social History  . Marital Status: Married    Spouse Name: N/A    Number of Children: N/A  . Years of Education: N/A   Occupational History  . Not on file.   Social History Main Topics  . Smoking status: Former Smoker    Quit date: 12/01/2009  . Smokeless tobacco: Not on file  . Alcohol Use: No  . Drug Use: No  . Sexually Active: Yes   Other Topics Concern  . Not on file   Social History Narrative   GYN - Dr Cherie Dark cancerMother w/alcoholismMarried 3d GS due NovDiet Coke: 10 cans a dayRegular exercise - NOFormer smoker 2009 restared 2010 stopped 12/2009    Current Outpatient Prescriptions on File Prior to Visit  Medication Sig Dispense Refill  . baclofen (LIORESAL) 10 MG tablet Take 1 tablet (10 mg total) by mouth 2 (two) times daily as needed (spasms, cramps).  180 each  3  . Cholecalciferol (VITAMIN D3) 1000 UNIT tablet Take 2,000 Units by mouth daily.         Marland Kitchen estradiol (ESTRACE) 1 MG tablet Take 1 mg by mouth daily.        . fluticasone (FLONASE) 50 MCG/ACT nasal spray Place 2 sprays into the nose daily.  16 g  6  . gabapentin (NEURONTIN) 600 MG tablet Take 1 tablet (600 mg total) by mouth 3 (three) times daily.  360 tablet  3  . HYDROcodone-acetaminophen (NORCO) 10-325 MG per tablet Take 1 tablet by mouth every 6 (six) hours as needed for pain.  120 tablet  2  . insulin glargine (LANTUS SOLOSTAR) 100 UNIT/ML injection Inject 20 Units into the skin at bedtime.  15 mL  3  . insulin lispro (HUMALOG) 100 UNIT/ML injection Inject 20 Units into the skin 3 (three) times daily before meals. And pen needles 3/day  30 mL  11  . LAMICTAL 25 MG tablet TAKE 1 TABLET BY MOUTH TWICE A DAY.  60 tablet  5  . lamoTRIgine (LAMICTAL) 25 MG tablet Take 1 tablet (25 mg total) by mouth 2 (two) times daily.  180 tablet  3  . levothyroxine (SYNTHROID, LEVOTHROID) 75 MCG tablet Take 1 tablet (  75 mcg total) by mouth daily.  90 tablet  3  . loratadine (CLARITIN) 10 MG tablet Take 1 tablet (10 mg total) by mouth daily.  100 tablet  3  . rosuvastatin (CRESTOR) 10 MG tablet Take 1 tablet (10 mg total) by mouth daily.  90 tablet  3  . SYNTHROID 75 MCG tablet TAKE 1 TABLET BY MOUTH ONCE DAILY  30 each  5  . traMADol (ULTRAM) 50 MG tablet Take 1-2 tablets (50-100 mg total) by mouth 2 (two) times daily as needed (headache).  180 tablet  2  . vitamin B-12 (CYANOCOBALAMIN) 1000 MCG tablet Take 1,000 mcg by mouth daily.        Marland Kitchen zolpidem (AMBIEN) 10 MG tablet Take 0.5-1 tablets (5-10 mg total) by mouth at bedtime as needed. For insomnia  90 tablet  1    Allergies  Allergen Reactions  . Crestor (Rosuvastatin Calcium)     myalgia  . Lovastatin   . Meperidine Hcl   . Metformin Nausea And Vomiting  . Niacin   . Paroxetine     Family History  Problem Relation Age of Onset  . Diabetes Mother   . Hypertension Mother   . Cancer Father 26    ? bone cancer    BP 118/70  Pulse  83  Temp 97.5 F (36.4 C) (Oral)  Wt 243 lb (110.224 kg)  SpO2 97%  Review of Systems denies hypoglycemia.      Objective:   Physical Exam VITAL SIGNS:  See vs page GENERAL: no distress SKIN:  Insulin injection sites at the anterior abdomen are normal     Assessment & Plan:  DM, needs increased rx

## 2012-11-14 ENCOUNTER — Ambulatory Visit (INDEPENDENT_AMBULATORY_CARE_PROVIDER_SITE_OTHER): Payer: Medicare PPO | Admitting: Endocrinology

## 2012-11-14 VITALS — BP 122/80 | HR 80 | Temp 97.8°F | Wt 247.0 lb

## 2012-11-14 DIAGNOSIS — E1142 Type 2 diabetes mellitus with diabetic polyneuropathy: Secondary | ICD-10-CM

## 2012-11-14 DIAGNOSIS — E1140 Type 2 diabetes mellitus with diabetic neuropathy, unspecified: Secondary | ICD-10-CM

## 2012-11-14 DIAGNOSIS — E1149 Type 2 diabetes mellitus with other diabetic neurological complication: Secondary | ICD-10-CM

## 2012-11-14 MED ORDER — INSULIN LISPRO 100 UNIT/ML ~~LOC~~ SOLN: 25.0000 [IU] | Freq: Three times a day (TID) | SUBCUTANEOUS | Status: DC

## 2012-11-14 NOTE — Patient Instructions (Addendum)
check your blood sugar twice a day.  vary the time of day when you check, between before the 3 meals, and at bedtime.  also check if you have symptoms of your blood sugar being too high or too low.  please keep a record of the readings and bring it to your next appointment here.  please call us sooner if your blood sugar goes below 70, or if it stays over 200.   please come back for a follow-up appointment in 1 month. increase humalog to 25 units 3x a day (just before each meal).  However, if you are going to be active, take only 5 units.

## 2012-11-14 NOTE — Progress Notes (Signed)
Subjective:    Patient ID: [name], female    DOB: [birth year], 47 y.o.   MRN: [id number]  HPI pt returns for f/u of type 2 DM (dx'ed 2006, complicated by peripheral sensory neuropathy.  She did not tolerate metformin, due to n/v).  no cbg record, but states cbg's vary from 190-240.  It is in general higher as the day goes on. pt states she feels well in general, except for anxiety.   Past Medical History  Diagnosis Date  . Syncope     poss conversion disorder vs psychogenic seizures  . DM (diabetes mellitus)   . Hypothyroidism   . LBP (low back pain)     DR Noel Gerold  . Peripheral neuropathy   . COPD (chronic obstructive pulmonary disease)   . Vitamin D deficiency   . Migraine   . Hyperlipidemia   . Anxiety   . Gout     possible  . Tachycardia     diet coke overuse?  . TTS (tarsal tunnel syndrome) 2010    Left Foot [facility] in [place]    Past Surgical History  Procedure Date  . Cervical fusion     C4-5  . Abdominal hysterectomy   . Carpal tunnel release 2010    LEFT    History   Social History  . Marital Status: Married    Spouse Name: N/A    Number of Children: N/A  . Years of Education: N/A   Occupational History  . Not on file.   Social History Main Topics  . Smoking status: Former Smoker    Quit date: 12/01/2009  . Smokeless tobacco: Not on file  . Alcohol Use: No  . Drug Use: No  . Sexually Active: Yes   Other Topics Concern  . Not on file   Social History Narrative   GYN - Dr Cherie Dark cancerMother w/alcoholismMarried 3d GS due NovDiet Coke: 10 cans a dayRegular exercise - NOFormer smoker 2009 restared 2010 stopped 12/2009    Current Outpatient Prescriptions on File Prior to Visit  Medication Sig Dispense Refill  . baclofen (LIORESAL) 10 MG tablet Take 1 tablet (10 mg total) by mouth 2 (two) times daily as needed (spasms, cramps).  180 each  3  . Cholecalciferol (VITAMIN D3) 1000 UNIT tablet Take 2,000 Units by mouth daily.        Marland Kitchen  estradiol (ESTRACE) 1 MG tablet Take 1 mg by mouth daily.        . fluticasone (FLONASE) 50 MCG/ACT nasal spray Place 2 sprays into the nose daily.  16 g  6  . gabapentin (NEURONTIN) 600 MG tablet Take 1 tablet (600 mg total) by mouth 3 (three) times daily.  360 tablet  3  . HYDROcodone-acetaminophen (NORCO) 10-325 MG per tablet Take 1 tablet by mouth every 6 (six) hours as needed for pain.  120 tablet  2  . insulin glargine (LANTUS SOLOSTAR) 100 UNIT/ML injection Inject 20 Units into the skin at bedtime.  15 mL  3  . insulin lispro (HUMALOG) 100 UNIT/ML injection Inject 25 Units into the skin 3 (three) times daily before meals. And pen needles 3/day  30 mL  11  . LAMICTAL 25 MG tablet TAKE 1 TABLET BY MOUTH TWICE A DAY.  60 tablet  5  . lamoTRIgine (LAMICTAL) 25 MG tablet Take 1 tablet (25 mg total) by mouth 2 (two) times daily.  180 tablet  3  . levothyroxine (SYNTHROID, LEVOTHROID) 75 MCG tablet Take 1 tablet (  75 mcg total) by mouth daily.  90 tablet  3  . loratadine (CLARITIN) 10 MG tablet Take 1 tablet (10 mg total) by mouth daily.  100 tablet  3  . rosuvastatin (CRESTOR) 10 MG tablet Take 1 tablet (10 mg total) by mouth daily.  90 tablet  3  . SYNTHROID 75 MCG tablet TAKE 1 TABLET BY MOUTH ONCE DAILY  30 each  5  . traMADol (ULTRAM) 50 MG tablet Take 1-2 tablets (50-100 mg total) by mouth 2 (two) times daily as needed (headache).  180 tablet  2  . vitamin B-12 (CYANOCOBALAMIN) 1000 MCG tablet Take 1,000 mcg by mouth daily.        Marland Kitchen zolpidem (AMBIEN) 10 MG tablet Take 0.5-1 tablets (5-10 mg total) by mouth at bedtime as needed. For insomnia  90 tablet  1    Allergies  Allergen Reactions  . Crestor (Rosuvastatin Calcium)     myalgia  . Lovastatin   . Meperidine Hcl   . Metformin Nausea And Vomiting  . Niacin   . Paroxetine    Family History  Problem Relation Age of Onset  . Diabetes Mother   . Hypertension Mother   . Cancer Father 57    ? bone cancer   BP 122/80  Pulse 80   Temp 97.8 F (36.6 C) (Oral)  Wt 247 lb (112.038 kg)  SpO2 98%  Review of Systems denies hypoglycemia    Objective:   Physical Exam VITAL SIGNS:  See vs page GENERAL: no distress PSYCH: Alert and oriented x 3.  Does not appear anxious nor depressed.     Assessment & Plan:  DM: needs increased rx

## 2012-12-06 ENCOUNTER — Telehealth: Payer: Self-pay | Admitting: *Deleted

## 2012-12-06 NOTE — Telephone Encounter (Signed)
Rf req for Zolpidem 10 mg. For 90 day supply. Must be written/signed and faxed back to [facility] [phone]. Please advise.

## 2012-12-07 MED ORDER — ZOLPIDEM TARTRATE 10 MG PO TABS: 5.0000 mg | ORAL_TABLET | Freq: Every evening | ORAL | Status: DC | PRN

## 2012-12-07 NOTE — Telephone Encounter (Signed)
OK to fill this prescription with additional refills x1 Thank you!  

## 2012-12-07 NOTE — Telephone Encounter (Signed)
Rx printed/signed/faxed to [facility].

## 2012-12-12 ENCOUNTER — Ambulatory Visit: Payer: Medicare PPO | Admitting: Internal Medicine

## 2012-12-19 ENCOUNTER — Ambulatory Visit (INDEPENDENT_AMBULATORY_CARE_PROVIDER_SITE_OTHER): Payer: Medicare PPO | Admitting: Endocrinology

## 2012-12-19 ENCOUNTER — Encounter: Payer: Self-pay | Admitting: Endocrinology

## 2012-12-19 VITALS — BP 126/84 | HR 80 | Wt 252.0 lb

## 2012-12-19 DIAGNOSIS — E1140 Type 2 diabetes mellitus with diabetic neuropathy, unspecified: Secondary | ICD-10-CM

## 2012-12-19 DIAGNOSIS — E1142 Type 2 diabetes mellitus with diabetic polyneuropathy: Secondary | ICD-10-CM

## 2012-12-19 DIAGNOSIS — E1149 Type 2 diabetes mellitus with other diabetic neurological complication: Secondary | ICD-10-CM

## 2012-12-19 MED ORDER — INSULIN LISPRO 100 UNIT/ML ~~LOC~~ SOLN: 25.0000 [IU] | Freq: Three times a day (TID) | SUBCUTANEOUS | Status: DC

## 2012-12-19 MED ORDER — INSULIN GLARGINE 100 UNIT/ML ~~LOC~~ SOLN: 30.0000 [IU] | Freq: Every day | SUBCUTANEOUS | Status: DC

## 2012-12-19 NOTE — Progress Notes (Signed)
Subjective:    Patient ID: Katherine Herrera, female    DOB: 14-Aug-1966, 47 y.o.   MRN: [id number]  HPI pt returns for f/u of type 2 DM (dx'ed 2006, complicated by peripheral sensory neuropathy.  She did not tolerate metformin, due to n/v; he last episode of severe hypoglycemia was in late-2013).  no cbg record, but states cbg's vary from 140-200.  It is highest in am--she feels this is due to hs-snack.    Past Medical History  Diagnosis Date  . Syncope     poss conversion disorder vs psychogenic seizures  . DM (diabetes mellitus)   . Hypothyroidism   . LBP (low back pain)     DR Noel Gerold  . Peripheral neuropathy   . COPD (chronic obstructive pulmonary disease)   . Vitamin D deficiency   . Migraine   . Hyperlipidemia   . Anxiety   . Gout     possible  . Tachycardia     diet coke overuse?  . TTS (tarsal tunnel syndrome) 2010    Left Foot [facility] in [place]    Past Surgical History  Procedure Laterality Date  . Cervical fusion      C4-5  . Abdominal hysterectomy    . Carpal tunnel release  2010    LEFT    History   Social History  . Marital Status: Married    Spouse Name: N/A    Number of Children: N/A  . Years of Education: N/A   Occupational History  . Not on file.   Social History Main Topics  . Smoking status: Former Smoker    Quit date: 12/01/2009  . Smokeless tobacco: Not on file  . Alcohol Use: No  . Drug Use: No  . Sexually Active: Yes   Other Topics Concern  . Not on file   Social History Narrative   GYN - Dr Arelia Sneddon      FAMILY HISTORY   Lung cancer   Mother w/alcoholism      Married 3d GS due Nov   Diet Coke: 10 cans a day      Regular exercise - NO      Former smoker 2009 restared 2010 stopped 12/2009    Current Outpatient Prescriptions on File Prior to Visit  Medication Sig Dispense Refill  . baclofen (LIORESAL) 10 MG tablet Take 1 tablet (10 mg total) by mouth 2 (two) times daily as needed (spasms, cramps).  180 each  3  . Cholecalciferol  (VITAMIN D3) 1000 UNIT tablet Take 2,000 Units by mouth daily.        Marland Kitchen estradiol (ESTRACE) 1 MG tablet Take 1 mg by mouth daily.        . fluticasone (FLONASE) 50 MCG/ACT nasal spray Place 2 sprays into the nose daily.  16 g  6  . gabapentin (NEURONTIN) 600 MG tablet Take 1 tablet (600 mg total) by mouth 3 (three) times daily.  360 tablet  3  . HYDROcodone-acetaminophen (NORCO) 10-325 MG per tablet Take 1 tablet by mouth every 6 (six) hours as needed for pain.  120 tablet  2  . LAMICTAL 25 MG tablet TAKE 1 TABLET BY MOUTH TWICE A DAY.  60 tablet  5  . lamoTRIgine (LAMICTAL) 25 MG tablet Take 1 tablet (25 mg total) by mouth 2 (two) times daily.  180 tablet  3  . levothyroxine (SYNTHROID, LEVOTHROID) 75 MCG tablet Take 1 tablet (75 mcg total) by mouth daily.  90 tablet  3  .  loratadine (CLARITIN) 10 MG tablet Take 1 tablet (10 mg total) by mouth daily.  100 tablet  3  . SYNTHROID 75 MCG tablet TAKE 1 TABLET BY MOUTH ONCE DAILY  30 each  5  . traMADol (ULTRAM) 50 MG tablet Take 1-2 tablets (50-100 mg total) by mouth 2 (two) times daily as needed (headache).  180 tablet  2  . vitamin B-12 (CYANOCOBALAMIN) 1000 MCG tablet Take 1,000 mcg by mouth daily.        Marland Kitchen zolpidem (AMBIEN) 10 MG tablet Take 0.5-1 tablets (5-10 mg total) by mouth at bedtime as needed. For insomnia  90 tablet  1  . rosuvastatin (CRESTOR) 10 MG tablet Take 1 tablet (10 mg total) by mouth daily.  90 tablet  3   No current facility-administered medications on file prior to visit.    Allergies  Allergen Reactions  . Crestor (Rosuvastatin Calcium)     myalgia  . Lovastatin   . Meperidine Hcl   . Metformin Nausea And Vomiting  . Niacin   . Paroxetine     Family History  Problem Relation Age of Onset  . Diabetes Mother   . Hypertension Mother   . Cancer Father 70    ? bone cancer    BP 126/84  Pulse 80  Wt 252 lb (114.306 kg)  BMI 37.2 kg/m2  SpO2 97%    Review of Systems denies hypoglycemia    Objective:    Physical Exam VITAL SIGNS:  See vs page GENERAL: no distress PSYCH: Alert and oriented x 3.  Does not appear anxious nor depressed.   Lab Results  Component Value Date   HGBA1C 9.5* 12/19/2012      Assessment & Plan:  DM: needs increased rx

## 2012-12-19 NOTE — Patient Instructions (Addendum)
check your blood sugar twice a day.  vary the time of day when you check, between before the 3 meals, and at bedtime.  also check if you have symptoms of your blood sugar being too high or too low.  please keep a record of the readings and bring it to your next appointment here.  please call us sooner if your blood sugar goes below 70, or if it stays over 200.   please come back for a follow-up appointment in 1 month. increase humalog to 25 units 3x a day (just before each meal).  However, if you are going to be active, take only 5 units.  Please increase the lantus back up to 30 units at bedtime.   There is no medical reason to eat at bedtime.

## 2012-12-20 LAB — MICROALBUMIN / CREATININE URINE RATIO
Creatinine,U: 76.1 mg/dL
Microalb Creat Ratio: 0.4 mg/g (ref 0.0–30.0)
Microalb, Ur: 0.3 mg/dL (ref 0.0–1.9)

## 2012-12-20 LAB — HEMOGLOBIN A1C: Hgb A1c MFr Bld: 9.5 % — ABNORMAL HIGH (ref 4.6–6.5)

## 2013-01-02 ENCOUNTER — Other Ambulatory Visit (INDEPENDENT_AMBULATORY_CARE_PROVIDER_SITE_OTHER): Payer: Medicare PPO

## 2013-01-02 ENCOUNTER — Other Ambulatory Visit: Payer: Self-pay | Admitting: *Deleted

## 2013-01-02 ENCOUNTER — Ambulatory Visit: Payer: Medicare PPO | Admitting: Internal Medicine

## 2013-01-02 DIAGNOSIS — E039 Hypothyroidism, unspecified: Secondary | ICD-10-CM

## 2013-01-02 DIAGNOSIS — R799 Abnormal finding of blood chemistry, unspecified: Secondary | ICD-10-CM

## 2013-01-02 DIAGNOSIS — E785 Hyperlipidemia, unspecified: Secondary | ICD-10-CM

## 2013-01-02 LAB — TSH: TSH: 1.67 u[IU]/mL (ref 0.35–5.50)

## 2013-01-02 LAB — LIPID PANEL
Cholesterol: 142 mg/dL (ref 0–200)
HDL: 32.2 mg/dL — ABNORMAL LOW (ref >39.00)
LDL Cholesterol: 81 mg/dL (ref 0–99)
Total CHOL/HDL Ratio: 4
Triglycerides: 144 mg/dL (ref 0.0–149.0)
VLDL: 28.8 mg/dL (ref 0.0–40.0)

## 2013-01-02 LAB — CBC WITH DIFFERENTIAL/PLATELET
Basophils Absolute: 0 10*3/uL (ref 0.0–0.1)
Basophils Relative: 0.6 % (ref 0.0–3.0)
Eosinophils Absolute: 0.2 10*3/uL (ref 0.0–0.7)
Eosinophils Relative: 2.1 % (ref 0.0–5.0)
HCT: 41.9 % (ref 36.0–46.0)
Hemoglobin: 14.1 g/dL (ref 12.0–15.0)
Lymphocytes Relative: 29.3 % (ref 12.0–46.0)
Lymphs Abs: 2.2 10*3/uL (ref 0.7–4.0)
MCHC: 33.7 g/dL (ref 30.0–36.0)
MCV: 86.5 fl (ref 78.0–100.0)
Monocytes Absolute: 0.5 10*3/uL (ref 0.1–1.0)
Monocytes Relative: 6.4 % (ref 3.0–12.0)
Neutro Abs: 4.6 10*3/uL (ref 1.4–7.7)
Neutrophils Relative %: 61.6 % (ref 43.0–77.0)
Platelets: 253 10*3/uL (ref 150.0–400.0)
RBC: 4.84 Mil/uL (ref 3.87–5.11)
RDW: 12.9 % (ref 11.5–14.6)
WBC: 7.5 10*3/uL (ref 4.5–10.5)

## 2013-01-02 LAB — BASIC METABOLIC PANEL
BUN: 16 mg/dL (ref 6–23)
CO2: 29 mEq/L (ref 19–32)
Calcium: 8.8 mg/dL (ref 8.4–10.5)
Chloride: 102 mEq/L (ref 96–112)
Creatinine, Ser: 0.9 mg/dL (ref 0.4–1.2)
GFR: 76.16 mL/min (ref >60.00)
Glucose, Bld: 232 mg/dL — ABNORMAL HIGH (ref 70–99)
Potassium: 4.7 mEq/L (ref 3.5–5.1)
Sodium: 136 mEq/L (ref 135–145)

## 2013-01-05 ENCOUNTER — Other Ambulatory Visit: Payer: Self-pay | Admitting: Internal Medicine

## 2013-01-09 ENCOUNTER — Ambulatory Visit (INDEPENDENT_AMBULATORY_CARE_PROVIDER_SITE_OTHER): Payer: Medicare PPO | Admitting: Internal Medicine

## 2013-01-09 ENCOUNTER — Encounter: Payer: Self-pay | Admitting: Internal Medicine

## 2013-01-09 VITALS — BP 120/80 | HR 80 | Temp 97.7°F | Resp 16 | Wt 256.0 lb

## 2013-01-09 DIAGNOSIS — E538 Deficiency of other specified B group vitamins: Secondary | ICD-10-CM

## 2013-01-09 DIAGNOSIS — E1149 Type 2 diabetes mellitus with other diabetic neurological complication: Secondary | ICD-10-CM

## 2013-01-09 DIAGNOSIS — E1142 Type 2 diabetes mellitus with diabetic polyneuropathy: Secondary | ICD-10-CM

## 2013-01-09 DIAGNOSIS — E039 Hypothyroidism, unspecified: Secondary | ICD-10-CM

## 2013-01-09 DIAGNOSIS — G609 Hereditary and idiopathic neuropathy, unspecified: Secondary | ICD-10-CM

## 2013-01-09 DIAGNOSIS — M545 Low back pain, unspecified: Secondary | ICD-10-CM

## 2013-01-09 DIAGNOSIS — E1140 Type 2 diabetes mellitus with diabetic neuropathy, unspecified: Secondary | ICD-10-CM

## 2013-01-09 DIAGNOSIS — E785 Hyperlipidemia, unspecified: Secondary | ICD-10-CM

## 2013-01-09 MED ORDER — HYDROCODONE-ACETAMINOPHEN 10-325 MG PO TABS: 1.0000 | ORAL_TABLET | Freq: Four times a day (QID) | ORAL | Status: DC | PRN

## 2013-01-09 MED ORDER — BACLOFEN 10 MG PO TABS: 20.0000 mg | ORAL_TABLET | Freq: Two times a day (BID) | ORAL | Status: DC

## 2013-01-09 MED ORDER — GABAPENTIN 800 MG PO TABS: 800.0000 mg | ORAL_TABLET | Freq: Three times a day (TID) | ORAL | Status: DC

## 2013-01-09 NOTE — Assessment & Plan Note (Signed)
Flexeril is not covered. Baclofen is not helping back spasms at this current dose. Will double the dose. Stop it if side effects or if not helping

## 2013-01-09 NOTE — Assessment & Plan Note (Signed)
Increased Gabapentin 

## 2013-01-09 NOTE — Assessment & Plan Note (Signed)
Continue with current prescription therapy as reflected on the Med list.  

## 2013-01-09 NOTE — Assessment & Plan Note (Signed)
Better Continue with current prescription therapy as reflected on the Med list.  

## 2013-01-09 NOTE — Progress Notes (Signed)
   Subjective:    HPI  The patient presents for a follow-up of  chronic hypertension, chronic dyslipidemia, type 2 diabetes and FMS and LE neuropathy - asking to increase Gabapentin CBGs are high: work in progress w/Dr Everardo All - better Vicodin is helping w/LBP Tramadol is helping w/HA - not using together  Wt Readings from Last 3 Encounters:  01/09/13 256 lb (116.121 kg)  12/19/12 252 lb (114.306 kg)  11/14/12 247 lb (112.038 kg)   BP Readings from Last 3 Encounters:  01/09/13 120/80  12/19/12 126/84  11/14/12 122/80      Review of Systems  Constitutional: Negative for chills, activity change, appetite change, fatigue and unexpected weight change.  HENT: Negative for congestion, mouth sores and sinus pressure.   Eyes: Negative for visual disturbance.  Respiratory: Negative for cough and chest tightness.   Gastrointestinal: Negative for nausea and abdominal pain.  Genitourinary: Negative for frequency, difficulty urinating and vaginal pain.  Musculoskeletal: Positive for arthralgias. Negative for back pain and gait problem.  Skin: Negative for pallor and rash.  Neurological: Negative for dizziness, tremors, weakness, numbness and headaches.  Psychiatric/Behavioral: Negative for suicidal ideas, confusion and sleep disturbance. The patient is nervous/anxious.        Objective:   Physical Exam  Constitutional: She appears well-developed. No distress.  HENT:  Head: Normocephalic.  Right Ear: External ear normal.  Left Ear: External ear normal.  Nose: Nose normal.  Mouth/Throat: Oropharynx is clear and moist.  Eyes: Conjunctivae are normal. Pupils are equal, round, and reactive to light. Right eye exhibits no discharge. Left eye exhibits no discharge.  Neck: Normal range of motion. Neck supple. No JVD present. No tracheal deviation present. No thyromegaly present.  Cardiovascular: Normal rate, regular rhythm and normal heart sounds.   Pulmonary/Chest: No stridor. No  respiratory distress. She has no wheezes.  Abdominal: Soft. Bowel sounds are normal. She exhibits no distension and no mass. There is no tenderness. There is no rebound and no guarding.  Musculoskeletal: She exhibits tenderness (LS spine is tender, leg muscles hurt). She exhibits no edema.  Lymphadenopathy:    She has no cervical adenopathy.  Neurological: She displays normal reflexes. No cranial nerve deficit. She exhibits normal muscle tone. Coordination normal.  Skin: No rash noted. No erythema.  Psychiatric: She has a normal mood and affect. Her behavior is normal. Judgment and thought content normal.     Lab Results  Component Value Date   WBC 7.5 01/02/2013   HGB 14.1 01/02/2013   HCT 41.9 01/02/2013   PLT 253.0 01/02/2013   GLUCOSE 232* 01/02/2013   CHOL 142 01/02/2013   TRIG 144.0 01/02/2013   HDL 32.20* 01/02/2013   LDLDIRECT 160.5 03/19/2012   LDLCALC 81 01/02/2013   ALT 21 10/18/2011   AST 19 10/18/2011   NA 136 01/02/2013   K 4.7 01/02/2013   CL 102 01/02/2013   CREATININE 0.9 01/02/2013   BUN 16 01/02/2013   CO2 29 01/02/2013   TSH 1.67 01/02/2013   HGBA1C 9.5* 12/19/2012   MICROALBUR 0.3 12/19/2012        Assessment & Plan:

## 2013-01-10 ENCOUNTER — Other Ambulatory Visit: Payer: Self-pay | Admitting: Internal Medicine

## 2013-01-14 ENCOUNTER — Other Ambulatory Visit: Payer: Self-pay | Admitting: *Deleted

## 2013-01-14 MED ORDER — INSULIN GLARGINE 100 UNIT/ML ~~LOC~~ SOLN: 30.0000 [IU] | Freq: Every day | SUBCUTANEOUS | Status: DC

## 2013-01-15 ENCOUNTER — Other Ambulatory Visit: Payer: Self-pay | Admitting: Internal Medicine

## 2013-01-16 ENCOUNTER — Encounter: Payer: Self-pay | Admitting: Endocrinology

## 2013-01-16 ENCOUNTER — Ambulatory Visit (INDEPENDENT_AMBULATORY_CARE_PROVIDER_SITE_OTHER): Payer: Medicare PPO | Admitting: Endocrinology

## 2013-01-16 VITALS — BP 124/74 | HR 80 | Wt 254.0 lb

## 2013-01-16 DIAGNOSIS — E1140 Type 2 diabetes mellitus with diabetic neuropathy, unspecified: Secondary | ICD-10-CM

## 2013-01-16 DIAGNOSIS — E1149 Type 2 diabetes mellitus with other diabetic neurological complication: Secondary | ICD-10-CM

## 2013-01-16 DIAGNOSIS — E1142 Type 2 diabetes mellitus with diabetic polyneuropathy: Secondary | ICD-10-CM

## 2013-01-16 MED ORDER — INSULIN GLARGINE 100 UNIT/ML ~~LOC~~ SOLN: 35.0000 [IU] | Freq: Every day | SUBCUTANEOUS | Status: DC

## 2013-01-16 NOTE — Patient Instructions (Addendum)
check your blood sugar twice a day.  vary the time of day when you check, between before the 3 meals, and at bedtime.  also check if you have symptoms of your blood sugar being too high or too low.  please keep a record of the readings and bring it to your next appointment here.  please call us sooner if your blood sugar goes below 70, or if it stays over 200.   please come back for a follow-up appointment in 1 month. increase humalog to 25 units 3x a day (just before each meal).  However, if you are going to be active, take only 5 units.  Please increase the lantus back up to 35 units at bedtime.

## 2013-01-16 NOTE — Progress Notes (Signed)
Subjective:    Patient ID: Katherine Herrera, female    DOB: Oct 04, 1966, 47 y.o.   MRN: [id number]  HPI pt returns for f/u of type 2 DM (dx'ed 2006, complicated by peripheral sensory neuropathy.  She did not tolerate metformin, due to n/v; he last episode of severe hypoglycemia was in late-2013).  Pt says she seldom checks cbg's.  no cbg record, but states cbg's vary from 166-220.   It is still highest in am--she feels this is due to hs-snack.  Past Medical History  Diagnosis Date  . Syncope     poss conversion disorder vs psychogenic seizures  . DM (diabetes mellitus)   . Hypothyroidism   . LBP (low back pain)     DR Noel Gerold  . Peripheral neuropathy   . COPD (chronic obstructive pulmonary disease)   . Vitamin D deficiency   . Migraine   . Hyperlipidemia   . Anxiety   . Gout     possible  . Tachycardia     diet coke overuse?  . TTS (tarsal tunnel syndrome) 2010    Left Foot [facility] in [place]    Past Surgical History  Procedure Laterality Date  . Cervical fusion      C4-5  . Abdominal hysterectomy    . Carpal tunnel release  2010    LEFT    History   Social History  . Marital Status: Married    Spouse Name: N/A    Number of Children: N/A  . Years of Education: N/A   Occupational History  . Not on file.   Social History Main Topics  . Smoking status: Former Smoker    Quit date: 12/01/2009  . Smokeless tobacco: Not on file  . Alcohol Use: No  . Drug Use: No  . Sexually Active: Yes   Other Topics Concern  . Not on file   Social History Narrative   GYN - Dr Arelia Sneddon      FAMILY HISTORY   Lung cancer   Mother w/alcoholism      Married 3d GS due Nov   Diet Coke: 10 cans a day      Regular exercise - NO      Former smoker 2009 restared 2010 stopped 12/2009    Current Outpatient Prescriptions on File Prior to Visit  Medication Sig Dispense Refill  . baclofen (LIORESAL) 10 MG tablet Take 2 tablets (20 mg total) by mouth 2 (two) times daily.  360 each  0  .  Cholecalciferol (VITAMIN D3) 1000 UNIT tablet Take 2,000 Units by mouth daily.        Marland Kitchen estradiol (ESTRACE) 1 MG tablet Take 1 mg by mouth daily.        . fluticasone (FLONASE) 50 MCG/ACT nasal spray Place 2 sprays into the nose daily.  16 g  6  . gabapentin (NEURONTIN) 800 MG tablet Take 1 tablet (800 mg total) by mouth 3 (three) times daily.  270 tablet  1  . HYDROcodone-acetaminophen (NORCO) 10-325 MG per tablet Take 1 tablet by mouth every 6 (six) hours as needed for pain.  120 tablet  1  . insulin lispro (HUMALOG) 100 UNIT/ML injection Inject 25 Units into the skin 3 (three) times daily before meals. And pen needles 4/day  30 mL  11  . levothyroxine (SYNTHROID, LEVOTHROID) 75 MCG tablet Take 1 tablet (75 mcg total) by mouth daily.  90 tablet  3  . loratadine (CLARITIN) 10 MG tablet Take 1 tablet (10 mg total)  by mouth daily.  100 tablet  3  . SYNTHROID 75 MCG tablet TAKE 1 TABLET BY MOUTH ONCE DAILY  30 each  5  . traMADol (ULTRAM) 50 MG tablet Take 1-2 tablets (50-100 mg total) by mouth 2 (two) times daily as needed (headache).  180 tablet  2  . vitamin B-12 (CYANOCOBALAMIN) 1000 MCG tablet Take 1,000 mcg by mouth daily.        Marland Kitchen zolpidem (AMBIEN) 10 MG tablet Take 0.5-1 tablets (5-10 mg total) by mouth at bedtime as needed. For insomnia  90 tablet  1  . rosuvastatin (CRESTOR) 10 MG tablet Take 1 tablet (10 mg total) by mouth daily.  90 tablet  3   No current facility-administered medications on file prior to visit.    Allergies  Allergen Reactions  . Crestor (Rosuvastatin Calcium)     myalgia  . Lovastatin   . Meperidine Hcl   . Metformin Nausea And Vomiting  . Niacin   . Paroxetine     Family History  Problem Relation Age of Onset  . Diabetes Mother   . Hypertension Mother   . Cancer Father 46    ? bone cancer    BP 124/74  Pulse 80  Wt 254 lb (115.214 kg)  BMI 37.49 kg/m2  SpO2 98%   Review of Systems denies hypoglycemia.    Objective:   Physical Exam VITAL  SIGNS:  See vs page GENERAL: no distress.   Pulses: dorsalis pedis intact bilat, but decreased from normal.   Feet: no deformity.  no ulcer on the feet.  feet are of normal color and temp.  no edema.  Old healed surgical scar, between the left lateral malleolus and the achilles tendon.   Neuro: sensation is intact to touch on the feet, but decreased from normal.       Assessment & Plan:  DM: therapy limited by noncompliance with cbg recording.  i'll do the best i can.

## 2013-01-22 ENCOUNTER — Ambulatory Visit (INDEPENDENT_AMBULATORY_CARE_PROVIDER_SITE_OTHER): Payer: Medicare PPO | Admitting: Podiatry

## 2013-01-22 ENCOUNTER — Encounter: Payer: Self-pay | Admitting: Podiatry

## 2013-01-22 VITALS — BP 143/98 | HR 100 | Ht 69.5 in | Wt 253.0 lb

## 2013-01-22 DIAGNOSIS — M216X1 Other acquired deformities of right foot: Secondary | ICD-10-CM

## 2013-01-22 DIAGNOSIS — M25579 Pain in unspecified ankle and joints of unspecified foot: Secondary | ICD-10-CM | POA: Insufficient documentation

## 2013-01-22 DIAGNOSIS — M7741 Metatarsalgia, right foot: Secondary | ICD-10-CM

## 2013-01-22 DIAGNOSIS — M216X9 Other acquired deformities of unspecified foot: Secondary | ICD-10-CM

## 2013-01-22 DIAGNOSIS — M7660 Achilles tendinitis, unspecified leg: Secondary | ICD-10-CM

## 2013-01-22 DIAGNOSIS — L84 Corns and callosities: Secondary | ICD-10-CM

## 2013-01-22 DIAGNOSIS — M7662 Achilles tendinitis, left leg: Secondary | ICD-10-CM

## 2013-01-22 DIAGNOSIS — M7750 Other enthesopathy of unspecified foot: Secondary | ICD-10-CM

## 2013-01-22 NOTE — Progress Notes (Signed)
Subjective:  Has had left foot pain at ball and lateral ankle. Was instructed to do Physical therapy and wear well supported shoes. Patient has had 3 months of physical therapy to stretch Achilles tendon. Now the left foot pain is only at the back along the Achilles tendon. Right foot is hurting like the left one used to hurt, at arch and ball of the foot under 5th MPJ area with callus. Used compound cream for pain. Night splint was too painful to use.   Objective:  Orthopedic evaluation of lower limb: Severe Cavus type foot with rearfoot varus.  Callus under 5th MPJ bilateral R>L, 1st MPJ right. Ankle ROM: Able to dorsiflex 10 degrees on left ankle with knee extended. Right side: Not able to dorsiflex ankle joint beyond 90 degree with knee extended position.  Positive of arch pain on right with weight bearing.  Neurologic, Dermatologic, Vascular Status show no abnormal findings and are all within normal.  Assessment:  1. Improved left Achilles tendon function. Ankle normal ROM fully restored. Forefoot compensation is minimum with minimum forefoot pain. This caused some soreness at the Achilles tendon. 2. Right Achilles tendon is still tight with forefoot compensation; excess forefoot loading to lateral column, excess tension at medial strip of Plantar fascial band, excess callus formation under 5th MPJ.   Plan:  1. Continue stretch exercise to stretch Achilles tendon especially on Rt.  2. May use compounding cream as needed. 3. Use well supported shoes.  4. Plantar calluses all debrided. Return in 2 months to re-evaluate right foot.

## 2013-01-22 NOTE — Patient Instructions (Addendum)
Seen for bilateral foot pain. Left at achilles tendon, Right at arch and bottom callus. Left Achilles tendon has good functional stretch at ankle joint and reduced pathological compensation. Right Achilles tendon is still tight, causing compensatory abnormal weight shifting to lateral callused area. Instruction: Continue with Stretch exercise and use compound cream as needed for sore area.  Continue with custom shoe inserts.  Return in 2 months.

## 2013-01-23 ENCOUNTER — Telehealth: Payer: Self-pay | Admitting: Internal Medicine

## 2013-01-23 MED ORDER — GABAPENTIN 800 MG PO TABS: 800.0000 mg | ORAL_TABLET | Freq: Three times a day (TID) | ORAL | Status: DC

## 2013-01-23 MED ORDER — BACLOFEN 10 MG PO TABS: 20.0000 mg | ORAL_TABLET | Freq: Two times a day (BID) | ORAL | Status: DC

## 2013-01-23 NOTE — Telephone Encounter (Signed)
Please fax new RX's to [facility] for Baclofen and gabapentin. The fax number is [phone].  Include her name, dob, and member number

## 2013-01-23 NOTE — Telephone Encounter (Signed)
Done

## 2013-01-25 ENCOUNTER — Telehealth: Payer: Self-pay | Admitting: *Deleted

## 2013-01-25 NOTE — Telephone Encounter (Signed)
Rf req for Tramadol 50 mg 1-2 po bid prn for HA. # 180.

## 2013-01-28 NOTE — Telephone Encounter (Signed)
Ok 0 ref

## 2013-01-30 MED ORDER — TRAMADOL HCL 50 MG PO TABS: 50.0000 mg | ORAL_TABLET | Freq: Two times a day (BID) | ORAL | Status: DC | PRN

## 2013-02-18 ENCOUNTER — Ambulatory Visit: Payer: Medicare PPO | Admitting: Podiatry

## 2013-03-18 ENCOUNTER — Ambulatory Visit (INDEPENDENT_AMBULATORY_CARE_PROVIDER_SITE_OTHER): Payer: Medicare PPO | Admitting: Endocrinology

## 2013-03-18 ENCOUNTER — Encounter: Payer: Self-pay | Admitting: Endocrinology

## 2013-03-18 VITALS — BP 126/70 | HR 104 | Ht 70.0 in | Wt 256.0 lb

## 2013-03-18 DIAGNOSIS — E1149 Type 2 diabetes mellitus with other diabetic neurological complication: Secondary | ICD-10-CM

## 2013-03-18 DIAGNOSIS — E1140 Type 2 diabetes mellitus with diabetic neuropathy, unspecified: Secondary | ICD-10-CM

## 2013-03-18 DIAGNOSIS — E1142 Type 2 diabetes mellitus with diabetic polyneuropathy: Secondary | ICD-10-CM

## 2013-03-18 MED ORDER — ROSUVASTATIN CALCIUM 10 MG PO TABS: 10.0000 mg | ORAL_TABLET | Freq: Every day | ORAL | Status: DC

## 2013-03-18 NOTE — Progress Notes (Signed)
Subjective:    Patient ID: Katherine Herrera, female    DOB: 07-30-1966, 47 y.o.   MRN: [id number]  HPI pt returns for f/u of type 2 DM (dx'ed 2006, not in the context of pancreatitis; she has associated sensory neuropathy of the lower extremities; her last episode of severe hypoglycemia was in late-2013; she has never had DKA).  Pt says she seldom checks cbg's.  no cbg record, but states cbg's vary from 71-200's.  It is in general lower in the morning and afternoon.   Past Medical History  Diagnosis Date  . Syncope     poss conversion disorder vs psychogenic seizures  . DM (diabetes mellitus)   . Hypothyroidism   . LBP (low back pain)     DR Noel Gerold  . Peripheral neuropathy   . COPD (chronic obstructive pulmonary disease)   . Vitamin D deficiency   . Migraine   . Hyperlipidemia   . Anxiety   . Gout     possible  . Tachycardia     diet coke overuse?  . TTS (tarsal tunnel syndrome) 2010    Left Foot [facility] in [place]    Past Surgical History  Procedure Laterality Date  . Cervical fusion      C4-5  . Abdominal hysterectomy    . Carpal tunnel release  2010    LEFT    History   Social History  . Marital Status: Married    Spouse Name: N/A    Number of Children: N/A  . Years of Education: N/A   Occupational History  . Not on file.   Social History Main Topics  . Smoking status: Former Smoker    Quit date: 12/01/2009  . Smokeless tobacco: Never Used  . Alcohol Use: No  . Drug Use: No  . Sexually Active: Yes   Other Topics Concern  . Not on file   Social History Narrative   GYN - Dr Arelia Sneddon      FAMILY HISTORY   Lung cancer   Mother w/alcoholism      Married 3d GS due Nov   Diet Coke: 10 cans a day      Regular exercise - NO      Former smoker 2009 restared 2010 stopped 12/2009    Current Outpatient Prescriptions on File Prior to Visit  Medication Sig Dispense Refill  . baclofen (LIORESAL) 10 MG tablet Take 2 tablets (20 mg total) by mouth 2 (two) times  daily.  360 each  1  . Cholecalciferol (VITAMIN D3) 1000 UNIT tablet Take 2,000 Units by mouth daily.        Marland Kitchen estradiol (ESTRACE) 1 MG tablet Take 1 mg by mouth daily.        . fluticasone (FLONASE) 50 MCG/ACT nasal spray Place 2 sprays into the nose daily.  16 g  6  . gabapentin (NEURONTIN) 800 MG tablet Take 1 tablet (800 mg total) by mouth 3 (three) times daily.  270 tablet  1  . HYDROcodone-acetaminophen (NORCO) 10-325 MG per tablet Take 1 tablet by mouth every 6 (six) hours as needed for pain.  120 tablet  1  . insulin glargine (LANTUS SOLOSTAR) 100 UNIT/ML injection Inject 0.35 mLs (35 Units total) into the skin at bedtime.  5 pen  PRN  . levothyroxine (SYNTHROID, LEVOTHROID) 75 MCG tablet Take 1 tablet (75 mcg total) by mouth daily.  90 tablet  3  . loratadine (CLARITIN) 10 MG tablet Take 1 tablet (10 mg total) by  mouth daily.  100 tablet  3  . SYNTHROID 75 MCG tablet TAKE 1 TABLET BY MOUTH ONCE DAILY  30 each  5  . traMADol (ULTRAM) 50 MG tablet Take 1-2 tablets (50-100 mg total) by mouth 2 (two) times daily as needed (headache).  180 tablet  0  . vitamin B-12 (CYANOCOBALAMIN) 1000 MCG tablet Take 1,000 mcg by mouth daily.        Marland Kitchen zolpidem (AMBIEN) 10 MG tablet Take 0.5-1 tablets (5-10 mg total) by mouth at bedtime as needed. For insomnia  90 tablet  1   No current facility-administered medications on file prior to visit.    Allergies  Allergen Reactions  . Crestor (Rosuvastatin Calcium)     myalgia  . Lovastatin   . Meperidine Hcl   . Metformin Nausea And Vomiting  . Niacin   . Paroxetine     Family History  Problem Relation Age of Onset  . Diabetes Mother   . Hypertension Mother   . Cancer Father 81    ? bone cancer    BP 126/70  Pulse 104  Ht 5\' 10"  (1.778 m)  Wt 256 lb (116.121 kg)  BMI 36.73 kg/m2  SpO2 100%  Review of Systems Denies LOC and weight change.    Objective:   Physical Exam VITAL SIGNS:  See vs page. GENERAL: no distress. SKIN:  Insulin  injection sites at the anterior abdomen are normal.    Lab Results  Component Value Date   HGBA1C 9.5* 12/19/2012      Assessment & Plan:  DM: needs increased rx Benefit of improved glycemic control should be weighed against risk of hypoglycemia.

## 2013-03-18 NOTE — Patient Instructions (Addendum)
check your blood sugar twice a day.  vary the time of day when you check, between before the 3 meals, and at bedtime.  also check if you have symptoms of your blood sugar being too high or too low.  please keep a record of the readings and bring it to your next appointment here.  please call us sooner if your blood sugar goes below 70, or if it stays over 200.   please come back for a follow-up appointment in 1 month. increase humalog to 3x a day (just before each meal), 35-20-35 units.  However, if you are going to be active, take only 5 units.  Please continue the same lantus.

## 2013-03-26 ENCOUNTER — Ambulatory Visit (INDEPENDENT_AMBULATORY_CARE_PROVIDER_SITE_OTHER): Payer: Medicare PPO | Admitting: Podiatry

## 2013-03-26 VITALS — BP 115/79 | HR 95

## 2013-03-26 DIAGNOSIS — M25579 Pain in unspecified ankle and joints of unspecified foot: Secondary | ICD-10-CM

## 2013-03-26 DIAGNOSIS — M7750 Other enthesopathy of unspecified foot: Secondary | ICD-10-CM

## 2013-03-26 DIAGNOSIS — M659 Synovitis and tenosynovitis, unspecified: Secondary | ICD-10-CM

## 2013-03-26 DIAGNOSIS — M25571 Pain in right ankle and joints of right foot: Secondary | ICD-10-CM

## 2013-03-26 NOTE — Progress Notes (Signed)
Subjective: Pulled ligament on side of ankle (lateral), heard a loud pop, swelled up bad, happened 5 weeks ago. Pain is still shooting up to the leg on postero lateral aspect of right lower limb. Stated that the left foot is feeling fine since she had done stretch exercise. Now the right foot is acting up. Sore on left lateral ankle. Objective: Mild edema right lateral ankle behind the lateral malleolus. Tender at the lateral aspect of right calf. No acute redness or acute edema seen. Very tight right Achilles tendon. Left side is normal. Severe rearfoot varus right.  Pes cavus bilateral. Assessment: Strained Peroneus Longus tendon right. Uncompensated rearfoot varus right.  Plan: Reviewed clinical findings. Advised to continue with stretch exercise as tolerated.  Ankle brace dispensed right. Return in one month for follow up.

## 2013-04-01 ENCOUNTER — Other Ambulatory Visit: Payer: Self-pay | Admitting: *Deleted

## 2013-04-01 MED ORDER — ROSUVASTATIN CALCIUM 10 MG PO TABS: 10.0000 mg | ORAL_TABLET | Freq: Every day | ORAL | Status: DC

## 2013-04-02 ENCOUNTER — Other Ambulatory Visit: Payer: Self-pay | Admitting: Internal Medicine

## 2013-04-09 ENCOUNTER — Encounter: Payer: Self-pay | Admitting: Internal Medicine

## 2013-04-09 ENCOUNTER — Ambulatory Visit (INDEPENDENT_AMBULATORY_CARE_PROVIDER_SITE_OTHER): Payer: Medicare PPO | Admitting: Internal Medicine

## 2013-04-09 VITALS — BP 100/78 | HR 76 | Temp 98.0°F | Resp 16 | Wt 256.0 lb

## 2013-04-09 DIAGNOSIS — F411 Generalized anxiety disorder: Secondary | ICD-10-CM

## 2013-04-09 DIAGNOSIS — E1149 Type 2 diabetes mellitus with other diabetic neurological complication: Secondary | ICD-10-CM

## 2013-04-09 DIAGNOSIS — M545 Low back pain, unspecified: Secondary | ICD-10-CM

## 2013-04-09 DIAGNOSIS — E538 Deficiency of other specified B group vitamins: Secondary | ICD-10-CM

## 2013-04-09 DIAGNOSIS — M109 Gout, unspecified: Secondary | ICD-10-CM

## 2013-04-09 DIAGNOSIS — E1140 Type 2 diabetes mellitus with diabetic neuropathy, unspecified: Secondary | ICD-10-CM

## 2013-04-09 DIAGNOSIS — E1142 Type 2 diabetes mellitus with diabetic polyneuropathy: Secondary | ICD-10-CM

## 2013-04-09 MED ORDER — CLONAZEPAM 0.5 MG PO TABS: 0.5000 mg | ORAL_TABLET | Freq: Two times a day (BID) | ORAL | Status: DC | PRN

## 2013-04-09 NOTE — Assessment & Plan Note (Signed)
Continue with current prescription therapy as reflected on the Med list.  

## 2013-04-09 NOTE — Progress Notes (Signed)
   Subjective:    HPI  The patient presents for a follow-up of  chronic hypertension, chronic dyslipidemia, type 2 diabetes and FMS and LE neuropathy - asking to increase Gabapentin CBGs are high: work in progress w/Dr Everardo All - better Vicodin is helping w/LBP Tramadol is helping w/HA - not using together C/o R foot pain C/o anxiety, mood swing  Wt Readings from Last 3 Encounters:  04/09/13 256 lb (116.121 kg)  03/18/13 256 lb (116.121 kg)  01/22/13 253 lb (114.76 kg)   BP Readings from Last 3 Encounters:  04/09/13 100/78  03/26/13 115/79  03/18/13 126/70      Review of Systems  Constitutional: Negative for chills, activity change, appetite change, fatigue and unexpected weight change.  HENT: Negative for congestion, mouth sores and sinus pressure.   Eyes: Negative for visual disturbance.  Respiratory: Negative for cough and chest tightness.   Gastrointestinal: Negative for nausea and abdominal pain.  Genitourinary: Negative for frequency, difficulty urinating and vaginal pain.  Musculoskeletal: Positive for arthralgias. Negative for back pain and gait problem.  Skin: Negative for pallor and rash.  Neurological: Negative for dizziness, tremors, weakness, numbness and headaches.  Psychiatric/Behavioral: Negative for suicidal ideas, confusion and sleep disturbance. The patient is nervous/anxious.        Objective:   Physical Exam  Constitutional: She appears well-developed. No distress.  HENT:  Head: Normocephalic.  Right Ear: External ear normal.  Left Ear: External ear normal.  Nose: Nose normal.  Mouth/Throat: Oropharynx is clear and moist.  Eyes: Conjunctivae are normal. Pupils are equal, round, and reactive to light. Right eye exhibits no discharge. Left eye exhibits no discharge.  Neck: Normal range of motion. Neck supple. No JVD present. No tracheal deviation present. No thyromegaly present.  Cardiovascular: Normal rate, regular rhythm and normal heart sounds.    Pulmonary/Chest: No stridor. No respiratory distress. She has no wheezes.  Abdominal: Soft. Bowel sounds are normal. She exhibits no distension and no mass. There is no tenderness. There is no rebound and no guarding.  Musculoskeletal: She exhibits tenderness (LS spine is tender, leg muscles hurt). She exhibits no edema.  Lymphadenopathy:    She has no cervical adenopathy.  Neurological: She displays normal reflexes. No cranial nerve deficit. She exhibits normal muscle tone. Coordination normal.  Skin: No rash noted. No erythema.  Psychiatric: She has a normal mood and affect. Her behavior is normal. Judgment and thought content normal.     Lab Results  Component Value Date   WBC 7.5 01/02/2013   HGB 14.1 01/02/2013   HCT 41.9 01/02/2013   PLT 253.0 01/02/2013   GLUCOSE 232* 01/02/2013   CHOL 142 01/02/2013   TRIG 144.0 01/02/2013   HDL 32.20* 01/02/2013   LDLDIRECT 160.5 03/19/2012   LDLCALC 81 01/02/2013   ALT 21 10/18/2011   AST 19 10/18/2011   NA 136 01/02/2013   K 4.7 01/02/2013   CL 102 01/02/2013   CREATININE 0.9 01/02/2013   BUN 16 01/02/2013   CO2 29 01/02/2013   TSH 1.67 01/02/2013   HGBA1C 9.5* 12/19/2012   MICROALBUR 0.3 12/19/2012        Assessment & Plan:

## 2013-04-09 NOTE — Assessment & Plan Note (Signed)
Re-injured recently

## 2013-04-09 NOTE — Assessment & Plan Note (Addendum)
Continue with current prescription therapy as reflected on the Med list.  

## 2013-04-17 ENCOUNTER — Ambulatory Visit: Payer: Medicare PPO | Admitting: Endocrinology

## 2013-04-17 ENCOUNTER — Telehealth: Payer: Self-pay | Admitting: Internal Medicine

## 2013-04-17 NOTE — Telephone Encounter (Signed)
Pt's husband to pick up samples of humalog kiwk pen u 100 and lantus solostar

## 2013-04-17 NOTE — Telephone Encounter (Signed)
Pt's spouse called and left message on voice mail. Patient is really sick, nauseated, vomiting, etc.  Pt was advised to let Dr. Everardo All know if these symptoms occurred so that the patient could be given sample shots.  Could you route this to Dr. Everardo All for him to decide if it is ok for patient to have the shots and for the patients spouse to come and pick them up.  Both patient and spouse are going out of town tomorrow.  I am cancelling the appointment for today, they would like to reschedule some time after 04/26/13. Please advise.

## 2013-04-17 NOTE — Telephone Encounter (Signed)
i don't understand "sample shots."  Please explain

## 2013-04-23 ENCOUNTER — Other Ambulatory Visit: Payer: Self-pay | Admitting: Obstetrics and Gynecology

## 2013-04-23 DIAGNOSIS — Z1239 Encounter for other screening for malignant neoplasm of breast: Secondary | ICD-10-CM

## 2013-05-06 ENCOUNTER — Ambulatory Visit (INDEPENDENT_AMBULATORY_CARE_PROVIDER_SITE_OTHER): Payer: Medicare PPO | Admitting: Podiatry

## 2013-05-06 DIAGNOSIS — M79609 Pain in unspecified limb: Secondary | ICD-10-CM

## 2013-05-06 DIAGNOSIS — M6701 Short Achilles tendon (acquired), right ankle: Secondary | ICD-10-CM

## 2013-05-06 DIAGNOSIS — M6240 Contracture of muscle, unspecified site: Secondary | ICD-10-CM

## 2013-05-06 NOTE — Progress Notes (Signed)
Stated that right foot still hurts on ball of the foot. Wearing surgical shoes because of swelling in ankle. Had tried physical therapy that helped on the left side, but right side is still tight on Achilles tendon. Wears ankle brace 3 x / week. Still uses compound cream for pain.  Objective: Cavus type foot with severe rearfoot varus and tight Achilles tendon. Pain on ball of right foot. Severe callus under 5th MPJ and plantar medial aspect heel right.  Subjective: Ankle Equinus right. Uncompensated rearfoot varus right. Metatarsalgia right.  Plan: Reviewed clinical findings. Reviewed percutaneous TAL procedure for right. Patient will return with her decision.

## 2013-05-07 ENCOUNTER — Other Ambulatory Visit: Payer: Self-pay | Admitting: Internal Medicine

## 2013-05-08 ENCOUNTER — Ambulatory Visit
Admission: RE | Admit: 2013-05-08 | Discharge: 2013-05-08 | Disposition: A | Discharge status: Home / Self Care | Payer: Medicare PPO | Source: Ambulatory Visit | Attending: Obstetrics and Gynecology | Admitting: Obstetrics and Gynecology

## 2013-05-08 DIAGNOSIS — Z1239 Encounter for other screening for malignant neoplasm of breast: Secondary | ICD-10-CM

## 2013-05-08 MED ORDER — GADOBENATE DIMEGLUMINE 529 MG/ML IV SOLN
20.0000 mL | Freq: Once | INTRAVENOUS | Status: AC | PRN
  Administered 2013-05-08: 20 mL via INTRAVENOUS

## 2013-05-09 ENCOUNTER — Other Ambulatory Visit: Payer: Self-pay | Admitting: Endocrinology

## 2013-05-10 ENCOUNTER — Telehealth: Payer: Self-pay | Admitting: *Deleted

## 2013-05-10 NOTE — Telephone Encounter (Signed)
Pt called requesting hydrocodone-Acetaminophen refill.  Please advise

## 2013-05-11 NOTE — Telephone Encounter (Signed)
Ok Thx 

## 2013-05-24 ENCOUNTER — Ambulatory Visit (INDEPENDENT_AMBULATORY_CARE_PROVIDER_SITE_OTHER): Payer: Medicare PPO | Admitting: Endocrinology

## 2013-05-24 ENCOUNTER — Encounter: Payer: Self-pay | Admitting: Endocrinology

## 2013-05-24 VITALS — BP 126/78 | HR 75 | Ht 69.0 in | Wt 254.0 lb

## 2013-05-24 DIAGNOSIS — E119 Type 2 diabetes mellitus without complications: Secondary | ICD-10-CM

## 2013-05-24 DIAGNOSIS — E1142 Type 2 diabetes mellitus with diabetic polyneuropathy: Secondary | ICD-10-CM

## 2013-05-24 DIAGNOSIS — E1149 Type 2 diabetes mellitus with other diabetic neurological complication: Secondary | ICD-10-CM

## 2013-05-24 DIAGNOSIS — E1140 Type 2 diabetes mellitus with diabetic neuropathy, unspecified: Secondary | ICD-10-CM

## 2013-05-24 LAB — HEMOGLOBIN A1C: Hgb A1c MFr Bld: 7.8 % — ABNORMAL HIGH (ref 4.6–6.5)

## 2013-05-24 NOTE — Patient Instructions (Addendum)
check your blood sugar twice a day.  vary the time of day when you check, between before the 3 meals, and at bedtime.  also check if you have symptoms of your blood sugar being too high or too low.  please keep a record of the readings and bring it to your next appointment here.  please call us sooner if your blood sugar goes below 70, or if it stays over 200.   please come back for a follow-up appointment in 3 months. A diabetes blood test is requested for you today.  We'll contact you with results.

## 2013-05-24 NOTE — Progress Notes (Signed)
Subjective:    Patient ID: Katherine Herrera, female    DOB: [birth year], 47 y.o.   MRN: [id number]  HPI pt returns for f/u of type 2 DM (dx'ed 2006, not in the context of pancreatitis; she has moderate sensory neuropathy of the lower extremities, but no associated complications; her last episode of severe hypoglycemia was in late 2013; she has never had DKA).  no cbg record, but states cbg's vary from 100-220.  It is in general lowest before lunch, and highest at hs  She has severe fatigue after lunch (she checked cbg once during the sxs--109).   Past Medical History  Diagnosis Date  . Syncope     poss conversion disorder vs psychogenic seizures  . DM (diabetes mellitus)   . Hypothyroidism   . LBP (low back pain)     DR Noel Gerold  . Peripheral neuropathy   . COPD (chronic obstructive pulmonary disease)   . Vitamin D deficiency   . Migraine   . Hyperlipidemia   . Anxiety   . Gout     possible  . Tachycardia     diet coke overuse?  . TTS (tarsal tunnel syndrome) 2010    Left Foot [facility] in [place]    Past Surgical History  Procedure Laterality Date  . Cervical fusion      C4-5  . Abdominal hysterectomy    . Carpal tunnel release  2010    LEFT    History   Social History  . Marital Status: Married    Spouse Name: N/A    Number of Children: N/A  . Years of Education: N/A   Occupational History  . Not on file.   Social History Main Topics  . Smoking status: Former Smoker    Quit date: 12/01/2009  . Smokeless tobacco: Never Used  . Alcohol Use: No  . Drug Use: No  . Sexually Active: Yes   Other Topics Concern  . Not on file   Social History Narrative   GYN - Dr Arelia Sneddon      FAMILY HISTORY   Lung cancer   Mother w/alcoholism      Married 3d GS due Nov   Diet Coke: 10 cans a day      Regular exercise - NO      Former smoker 2009 restared 2010 stopped 12/2009    Current Outpatient Prescriptions on File Prior to Visit  Medication Sig Dispense Refill  . ACCU-CHEK  AVIVA PLUS test strip       . ACCU-CHEK SOFTCLIX LANCETS lancets       . baclofen (LIORESAL) 10 MG tablet Take 2 tablets (20 mg total) by mouth 2 (two) times daily.  360 each  1  . BD PEN NEEDLE NANO U/F 32G X 4 MM MISC       . Blood Glucose Monitoring Suppl (ACCU-CHEK AVIVA PLUS) W/DEVICE KIT USE AS DIRECTED  1 kit  0  . Cholecalciferol (VITAMIN D3) 1000 UNIT tablet Take 2,000 Units by mouth daily.        . clonazePAM (KLONOPIN) 0.5 MG tablet Take 1 tablet (0.5 mg total) by mouth 2 (two) times daily as needed for anxiety.  30 tablet  1  . CRESTOR 10 MG tablet TAKE 1 TABLET BY MOUTH ONCE DAILY  30 tablet  5  . estradiol (ESTRACE) 1 MG tablet Take 1 mg by mouth daily.        Marland Kitchen gabapentin (NEURONTIN) 800 MG tablet Take 1 tablet (800 mg total)  by mouth 3 (three) times daily.  270 tablet  1  . HYDROcodone-acetaminophen (NORCO) 10-325 MG per tablet TAKE ONE TABLET BY MOUTH EVERY 6 HOURS AS NEEDED FOR PAIN  120 tablet  0  . insulin glargine (LANTUS SOLOSTAR) 100 UNIT/ML injection Inject 0.35 mLs (35 Units total) into the skin at bedtime.  5 pen  PRN  . insulin lispro (HUMALOG) 100 UNIT/ML injection 3x a day (just before each meal), 35-20-35 units, and pen needles 4/day      . lamoTRIgine (LAMICTAL) 25 MG tablet Take 25 mg by mouth 2 (two) times daily.       Marland Kitchen levothyroxine (SYNTHROID, LEVOTHROID) 75 MCG tablet Take 1 tablet (75 mcg total) by mouth daily.  90 tablet  3  . traMADol (ULTRAM) 50 MG tablet Take 1-2 tablets (50-100 mg total) by mouth 2 (two) times daily as needed (headache).  180 tablet  0  . vitamin B-12 (CYANOCOBALAMIN) 1000 MCG tablet Take 1,000 mcg by mouth daily.        Marland Kitchen zolpidem (AMBIEN) 10 MG tablet Take 0.5-1 tablets (5-10 mg total) by mouth at bedtime as needed. For insomnia  90 tablet  1   No current facility-administered medications on file prior to visit.    Allergies  Allergen Reactions  . Crestor (Rosuvastatin Calcium)     myalgia  . Lovastatin   . Meperidine Hcl   .  Metformin Nausea And Vomiting  . Niacin   . Paroxetine     Family History  Problem Relation Age of Onset  . Diabetes Mother   . Hypertension Mother   . Cancer Father 31    ? bone cancer    BP 126/78  Pulse 75  Ht 5\' 9"  (1.753 m)  Wt 254 lb (115.214 kg)  BMI 37.49 kg/m2  SpO2 97%    Review of Systems denies hypoglycemia and weight change    Objective:   Physical Exam VITAL SIGNS:  See vs page GENERAL: no distress   Lab Results  Component Value Date   HGBA1C 7.8* 05/24/2013       Assessment & Plan:  DM: This insulin regimen was chosen from multiple options, as it best matches her insulin to her changing requirements throughout the day.  The benefits of glycemic control must be weighed against the risks of hypoglycemia.  She needs increased rx

## 2013-06-14 ENCOUNTER — Other Ambulatory Visit: Payer: Self-pay | Admitting: *Deleted

## 2013-06-14 MED ORDER — ROSUVASTATIN CALCIUM 10 MG PO TABS: 10.0000 mg | ORAL_TABLET | Freq: Every day | ORAL | Status: DC

## 2013-06-15 ENCOUNTER — Other Ambulatory Visit: Payer: Self-pay | Admitting: Internal Medicine

## 2013-06-17 NOTE — Telephone Encounter (Signed)
Ok to refill? Last OV 6.10.14 Last filled 7.8.14

## 2013-07-02 ENCOUNTER — Telehealth: Payer: Self-pay | Admitting: Endocrinology

## 2013-07-02 NOTE — Telephone Encounter (Signed)
Left message,We dont have samples

## 2013-07-10 ENCOUNTER — Ambulatory Visit (INDEPENDENT_AMBULATORY_CARE_PROVIDER_SITE_OTHER): Payer: Medicare PPO | Admitting: Endocrinology

## 2013-07-10 ENCOUNTER — Encounter: Payer: Self-pay | Admitting: Endocrinology

## 2013-07-10 ENCOUNTER — Encounter: Payer: Self-pay | Admitting: Internal Medicine

## 2013-07-10 ENCOUNTER — Ambulatory Visit (INDEPENDENT_AMBULATORY_CARE_PROVIDER_SITE_OTHER): Payer: Medicare PPO | Admitting: Internal Medicine

## 2013-07-10 VITALS — BP 120/72 | HR 76 | Temp 98.2°F | Resp 16 | Wt 258.0 lb

## 2013-07-10 VITALS — BP 124/72 | HR 74 | Ht 70.0 in | Wt 258.0 lb

## 2013-07-10 DIAGNOSIS — E1149 Type 2 diabetes mellitus with other diabetic neurological complication: Secondary | ICD-10-CM

## 2013-07-10 DIAGNOSIS — E1140 Type 2 diabetes mellitus with diabetic neuropathy, unspecified: Secondary | ICD-10-CM

## 2013-07-10 DIAGNOSIS — M109 Gout, unspecified: Secondary | ICD-10-CM

## 2013-07-10 DIAGNOSIS — E1142 Type 2 diabetes mellitus with diabetic polyneuropathy: Secondary | ICD-10-CM

## 2013-07-10 DIAGNOSIS — E538 Deficiency of other specified B group vitamins: Secondary | ICD-10-CM

## 2013-07-10 MED ORDER — INSULIN REGULAR HUMAN 100 UNIT/ML IJ SOLN: INTRAMUSCULAR | Status: DC

## 2013-07-10 MED ORDER — INSULIN NPH (HUMAN) (ISOPHANE) 100 UNIT/ML ~~LOC~~ SUSP: 30.0000 [IU] | Freq: Every day | SUBCUTANEOUS | Status: DC

## 2013-07-10 NOTE — Assessment & Plan Note (Signed)
Continue with current prescription therapy as reflected on the Med list.  

## 2013-07-10 NOTE — Progress Notes (Signed)
Subjective:    Patient ID: Katherine Herrera, female    DOB: 1966-01-18, 47 y.o.   MRN: [id number]  HPI pt returns for f/u of type 2 DM (dx'ed 2006, not in the context of pancreatitis; she has moderate sensory neuropathy of the lower extremities, but no associated complications; her last episode of severe hypoglycemia was in late 2013; she has never had DKA).  no cbg record, but states cbg's vary from 100-220.  It is in general lowest before lunch, and highest at hs  She has severe fatigue after lunch (she checked cbg once during the sxs--109).  Pt says she needs to change to cheaper insulins. pt states she feels well in general, except for diarrhea.   Past Medical History  Diagnosis Date  . Syncope     poss conversion disorder vs psychogenic seizures  . DM (diabetes mellitus)   . Hypothyroidism   . LBP (low back pain)     DR Noel Gerold  . Peripheral neuropathy   . COPD (chronic obstructive pulmonary disease)   . Vitamin D deficiency   . Migraine   . Hyperlipidemia   . Anxiety   . Gout     possible  . Tachycardia     diet coke overuse?  . TTS (tarsal tunnel syndrome) 2010    Left Foot [facility] in [place]    Past Surgical History  Procedure Laterality Date  . Cervical fusion      C4-5  . Abdominal hysterectomy    . Carpal tunnel release  2010    LEFT    History   Social History  . Marital Status: Married    Spouse Name: N/A    Number of Children: N/A  . Years of Education: N/A   Occupational History  . Not on file.   Social History Main Topics  . Smoking status: Former Smoker    Quit date: 12/01/2009  . Smokeless tobacco: Never Used  . Alcohol Use: No  . Drug Use: No  . Sexual Activity: Yes   Other Topics Concern  . Not on file   Social History Narrative   GYN - Dr Arelia Sneddon      FAMILY HISTORY   Lung cancer   Mother w/alcoholism      Married 3d GS due Nov   Diet Coke: 10 cans a day      Regular exercise - NO      Former smoker 2009 restared 2010 stopped 12/2009     Current Outpatient Prescriptions on File Prior to Visit  Medication Sig Dispense Refill  . ACCU-CHEK AVIVA PLUS test strip       . ACCU-CHEK SOFTCLIX LANCETS lancets       . baclofen (LIORESAL) 10 MG tablet Take 2 tablets (20 mg total) by mouth 2 (two) times daily.  360 each  1  . BD PEN NEEDLE NANO U/F 32G X 4 MM MISC       . Blood Glucose Monitoring Suppl (ACCU-CHEK AVIVA PLUS) W/DEVICE KIT USE AS DIRECTED  1 kit  0  . Cholecalciferol (VITAMIN D3) 1000 UNIT tablet Take 2,000 Units by mouth daily.        . clonazePAM (KLONOPIN) 0.5 MG tablet Take 1 tablet (0.5 mg total) by mouth 2 (two) times daily as needed for anxiety.  30 tablet  1  . estradiol (ESTRACE) 1 MG tablet Take 1 mg by mouth daily.        Marland Kitchen gabapentin (NEURONTIN) 800 MG tablet Take 1 tablet (800  mg total) by mouth 3 (three) times daily.  270 tablet  1  . HYDROcodone-acetaminophen (NORCO) 10-325 MG per tablet TAKE ONE TABLET BY MOUTH EVERY 6 HOURS AS NEEDED FOR PAIN  120 tablet  1  . lamoTRIgine (LAMICTAL) 25 MG tablet Take 25 mg by mouth 2 (two) times daily.       Marland Kitchen levothyroxine (SYNTHROID, LEVOTHROID) 75 MCG tablet Take 1 tablet (75 mcg total) by mouth daily.  90 tablet  3  . rosuvastatin (CRESTOR) 10 MG tablet Take 1 tablet (10 mg total) by mouth daily.  21 tablet  0  . traMADol (ULTRAM) 50 MG tablet Take 1-2 tablets (50-100 mg total) by mouth 2 (two) times daily as needed (headache).  180 tablet  0  . vitamin B-12 (CYANOCOBALAMIN) 1000 MCG tablet Take 1,000 mcg by mouth daily.        Marland Kitchen zolpidem (AMBIEN) 10 MG tablet Take 0.5-1 tablets (5-10 mg total) by mouth at bedtime as needed. For insomnia  90 tablet  1   No current facility-administered medications on file prior to visit.    Allergies  Allergen Reactions  . Crestor [Rosuvastatin Calcium]     myalgia  . Lovastatin   . Meperidine Hcl   . Metformin Nausea And Vomiting  . Niacin   . Paroxetine     Family History  Problem Relation Age of Onset  . Diabetes  Mother   . Hypertension Mother   . Cancer Father 22    ? bone cancer    BP 124/72  Pulse 74  Ht 5\' 10"  (1.778 m)  Wt 258 lb (117.028 kg)  BMI 37.02 kg/m2  SpO2 98%  Review of Systems denies hypoglycemia and weight change    Objective:   Physical Exam VITAL SIGNS:  See vs page GENERAL: no distress   Lab Results  Component Value Date   HGBA1C 7.8* 05/24/2013       Assessment & Plan:  DM: therapy limited by pt's request for least expensive meds. Diarrhea: this complicates the rx of DM. Neuropathy: this limits exercise rx of DM.

## 2013-07-10 NOTE — Progress Notes (Signed)
   Subjective:    HPI  C/o LBP - worse - she slipped and almost fell last week  The patient presents for a follow-up of  chronic hypertension, chronic dyslipidemia, type 2 diabetes and FMS and LE neuropathy - asking to increase Gabapentin CBGs are high: work in progress w/Dr Everardo All - better Vicodin is helping w/LBP Tramadol is helping w/HA - not using together F/u L foot pain F/u anxiety, mood swing  Wt Readings from Last 3 Encounters:  07/10/13 258 lb (117.028 kg)  07/10/13 258 lb (117.028 kg)  05/24/13 254 lb (115.214 kg)   BP Readings from Last 3 Encounters:  07/10/13 120/72  07/10/13 124/72  05/24/13 126/78      Review of Systems  Constitutional: Negative for chills, activity change, appetite change, fatigue and unexpected weight change.  HENT: Negative for congestion, mouth sores and sinus pressure.   Eyes: Negative for visual disturbance.  Respiratory: Negative for cough and chest tightness.   Gastrointestinal: Negative for nausea and abdominal pain.  Genitourinary: Negative for frequency, difficulty urinating and vaginal pain.  Musculoskeletal: Positive for arthralgias. Negative for back pain and gait problem.  Skin: Negative for pallor and rash.  Neurological: Negative for dizziness, tremors, weakness, numbness and headaches.  Psychiatric/Behavioral: Negative for suicidal ideas, confusion and sleep disturbance. The patient is nervous/anxious.        Objective:   Physical Exam  Constitutional: She appears well-developed. No distress.  HENT:  Head: Normocephalic.  Right Ear: External ear normal.  Left Ear: External ear normal.  Nose: Nose normal.  Mouth/Throat: Oropharynx is clear and moist.  Eyes: Conjunctivae are normal. Pupils are equal, round, and reactive to light. Right eye exhibits no discharge. Left eye exhibits no discharge.  Neck: Normal range of motion. Neck supple. No JVD present. No tracheal deviation present. No thyromegaly present.   Cardiovascular: Normal rate, regular rhythm and normal heart sounds.   Pulmonary/Chest: No stridor. No respiratory distress. She has no wheezes.  Abdominal: Soft. Bowel sounds are normal. She exhibits no distension and no mass. There is no tenderness. There is no rebound and no guarding.  Musculoskeletal: She exhibits tenderness (LS spine is tender, leg muscles hurt). She exhibits no edema.  Lymphadenopathy:    She has no cervical adenopathy.  Neurological: She displays normal reflexes. No cranial nerve deficit. She exhibits normal muscle tone. Coordination normal.  Skin: No rash noted. No erythema.  Psychiatric: She has a normal mood and affect. Her behavior is normal. Judgment and thought content normal.     Lab Results  Component Value Date   WBC 7.5 01/02/2013   HGB 14.1 01/02/2013   HCT 41.9 01/02/2013   PLT 253.0 01/02/2013   GLUCOSE 232* 01/02/2013   CHOL 142 01/02/2013   TRIG 144.0 01/02/2013   HDL 32.20* 01/02/2013   LDLDIRECT 160.5 03/19/2012   LDLCALC 81 01/02/2013   ALT 21 10/18/2011   AST 19 10/18/2011   NA 136 01/02/2013   K 4.7 01/02/2013   CL 102 01/02/2013   CREATININE 0.9 01/02/2013   BUN 16 01/02/2013   CO2 29 01/02/2013   TSH 1.67 01/02/2013   HGBA1C 7.8* 05/24/2013   MICROALBUR 0.3 12/19/2012        Assessment & Plan:

## 2013-07-10 NOTE — Assessment & Plan Note (Signed)
No relapse 

## 2013-07-10 NOTE — Patient Instructions (Addendum)
Please change the insulins to these new prescriptions.  Please come back for a follow-up appointment in 6 weeks.

## 2013-08-06 ENCOUNTER — Ambulatory Visit: Payer: Medicare PPO | Admitting: Endocrinology

## 2013-08-22 ENCOUNTER — Telehealth: Payer: Self-pay | Admitting: *Deleted

## 2013-08-22 NOTE — Telephone Encounter (Signed)
Pt called requesting hydrocodone refill.  Please advise 

## 2013-08-23 NOTE — Telephone Encounter (Signed)
OK to fill this prescription with additional refills x0 Thank you!  

## 2013-08-27 ENCOUNTER — Encounter: Payer: Self-pay | Admitting: Podiatry

## 2013-08-27 ENCOUNTER — Ambulatory Visit (INDEPENDENT_AMBULATORY_CARE_PROVIDER_SITE_OTHER): Payer: Medicare PPO | Admitting: Podiatry

## 2013-08-27 VITALS — BP 144/92 | HR 102 | Ht 69.5 in | Wt 258.0 lb

## 2013-08-27 DIAGNOSIS — M25571 Pain in right ankle and joints of right foot: Secondary | ICD-10-CM

## 2013-08-27 DIAGNOSIS — M6701 Short Achilles tendon (acquired), right ankle: Secondary | ICD-10-CM

## 2013-08-27 DIAGNOSIS — M25579 Pain in unspecified ankle and joints of unspecified foot: Secondary | ICD-10-CM

## 2013-08-27 DIAGNOSIS — M6240 Contracture of muscle, unspecified site: Secondary | ICD-10-CM

## 2013-08-27 NOTE — Patient Instructions (Signed)
Seen for pain on right foot. Noted of tight Achilles tendon right>left. Need Physical therapy to stretch tendon. Will write referral. Return 1 month after starting on Physical therapy.

## 2013-08-27 NOTE — Progress Notes (Signed)
Left foot still has swelling and tightness at back of calf.  Right foot pain starts from the 5th Met head and goes down to ankle and leg for duration of 3 weeks.  Wearing Regular Tennis shoes and has inserts. Has Diabetic shoes with inserts and recently replaced with new liner.   Objective: Pain upon light pressure to right lateral ankle. Positive of mild edema lateral malleoli right. Cavus type foot with forefoot varus. Positive of plantar callus under 5th MPJ right>left. Tight Achilles tendon right>left.  Assessment: Ankle Equinus bilateral right>left. Forefoot varus with plantar callus sub 5 bilateral right>left. Ankle edema right lateral.  Plan: Reviewed clinical findings and available treatment options. Explained the benefit of physical therapy. Both feet calluses trimmed.  Return in one month.

## 2013-09-02 ENCOUNTER — Other Ambulatory Visit: Payer: Self-pay | Admitting: *Deleted

## 2013-09-02 MED ORDER — HYDROCODONE-ACETAMINOPHEN 10-325 MG PO TABS: ORAL_TABLET | ORAL | Status: DC

## 2013-09-02 NOTE — Telephone Encounter (Signed)
Spoke with pt advised Rx called in

## 2013-09-02 NOTE — Telephone Encounter (Signed)
A user error has taken place.

## 2013-09-03 ENCOUNTER — Ambulatory Visit (INDEPENDENT_AMBULATORY_CARE_PROVIDER_SITE_OTHER): Payer: Medicare PPO | Admitting: Podiatry

## 2013-09-03 ENCOUNTER — Encounter: Payer: Self-pay | Admitting: Podiatry

## 2013-09-03 VITALS — BP 121/82 | HR 91 | Ht 69.5 in | Wt 258.0 lb

## 2013-09-03 DIAGNOSIS — M21961 Unspecified acquired deformity of right lower leg: Secondary | ICD-10-CM

## 2013-09-03 DIAGNOSIS — M659 Synovitis and tenosynovitis, unspecified: Secondary | ICD-10-CM

## 2013-09-03 DIAGNOSIS — M21969 Unspecified acquired deformity of unspecified lower leg: Secondary | ICD-10-CM

## 2013-09-03 DIAGNOSIS — M65979 Unspecified synovitis and tenosynovitis, unspecified ankle and foot: Secondary | ICD-10-CM

## 2013-09-03 DIAGNOSIS — M21962 Unspecified acquired deformity of left lower leg: Secondary | ICD-10-CM | POA: Insufficient documentation

## 2013-09-03 HISTORY — DX: Unspecified synovitis and tenosynovitis, unspecified ankle and foot: M65.979

## 2013-09-03 HISTORY — DX: Synovitis and tenosynovitis, unspecified: M65.9

## 2013-09-03 NOTE — Progress Notes (Signed)
Patient came in with increasing pain and swelling on right lateral ankle. Patient and husband wants to know what it would show in X-ray.  Objective: Cavus foot with varus position of calcaneous right. Hypermobile first ray right>L.  X-ray show high cavus foot with short first ray. Hard callus under the 5th MPJ bilateral. Varus turning heels bilateral.  Assessment: Tenosynovitis right ankle. Hypermobile and short first ray. Lateral weight shifting bilateral. Uncompensated rearfoot varus bilateral.  Plan: Reviewed findings. Offered pain patch and surgical options(Cotton osteotomy with graft). May require Dwyer osteotomy if not resolved with Cotton. Return to do pre-op.

## 2013-09-03 NOTE — Patient Instructions (Signed)
Seen for pain on right lateral ankle. Cavus foot with turning foot. May benefit from surgical intervention, Cotton osteotomy with bone graft right.  Return to do pre-op.

## 2013-09-09 ENCOUNTER — Telehealth: Payer: Self-pay | Admitting: Endocrinology

## 2013-09-09 NOTE — Telephone Encounter (Signed)
noted 

## 2013-09-09 NOTE — Telephone Encounter (Signed)
Pt called to can 3 month follow up w/ Dr. Everardo All, she will call later to r/s  [facility]

## 2013-09-10 ENCOUNTER — Other Ambulatory Visit: Payer: Self-pay | Admitting: Internal Medicine

## 2013-09-11 ENCOUNTER — Ambulatory Visit: Payer: Medicare PPO | Admitting: Endocrinology

## 2013-09-30 ENCOUNTER — Encounter: Payer: Self-pay | Admitting: Podiatry

## 2013-09-30 ENCOUNTER — Ambulatory Visit (INDEPENDENT_AMBULATORY_CARE_PROVIDER_SITE_OTHER): Payer: Medicare PPO | Admitting: Podiatry

## 2013-09-30 VITALS — BP 129/85 | HR 86

## 2013-09-30 DIAGNOSIS — L600 Ingrowing nail: Secondary | ICD-10-CM | POA: Insufficient documentation

## 2013-09-30 DIAGNOSIS — M79675 Pain in left toe(s): Secondary | ICD-10-CM | POA: Insufficient documentation

## 2013-09-30 DIAGNOSIS — M79609 Pain in unspecified limb: Secondary | ICD-10-CM

## 2013-09-30 MED ORDER — AMOXICILLIN-POT CLAVULANATE 500-125 MG PO TABS: 1.0000 | ORAL_TABLET | Freq: Three times a day (TID) | ORAL | Status: DC

## 2013-09-30 NOTE — Progress Notes (Signed)
Patient presents with ingrown nail on right great toe x 2 weeks. Has a friend cut her ingrown nail and still hurt.  Also request for a surgical consultation.  Objective: Painful ingrown inflamed nail right great toe. No ascending cellulitis or edema or drainage noted.  Plantar lateral 5th MPJ callus. Hypermobile first ray right.  Assessment: Inflamed ingrown nail right great toe. Hypermobile first ray right.  Plan: Will start with antibiotics. Return this Friday. If not resolved, will do ingrown nail surgery.

## 2013-09-30 NOTE — Patient Instructions (Signed)
Seen for ingrown nail left great toe. Need be in antibiotics. Rx sent. Take one every 8 hours. Return Friday. Will do pre-op when old bill is taken care of.

## 2013-10-09 ENCOUNTER — Ambulatory Visit (INDEPENDENT_AMBULATORY_CARE_PROVIDER_SITE_OTHER): Payer: Medicare PPO | Admitting: Internal Medicine

## 2013-10-09 ENCOUNTER — Other Ambulatory Visit (INDEPENDENT_AMBULATORY_CARE_PROVIDER_SITE_OTHER): Payer: Medicare PPO

## 2013-10-09 ENCOUNTER — Encounter: Payer: Self-pay | Admitting: Internal Medicine

## 2013-10-09 VITALS — BP 136/74 | HR 99 | Temp 97.1°F | Resp 16 | Ht 69.5 in | Wt 266.0 lb

## 2013-10-09 DIAGNOSIS — M545 Low back pain, unspecified: Secondary | ICD-10-CM

## 2013-10-09 DIAGNOSIS — E1142 Type 2 diabetes mellitus with diabetic polyneuropathy: Secondary | ICD-10-CM

## 2013-10-09 DIAGNOSIS — F411 Generalized anxiety disorder: Secondary | ICD-10-CM

## 2013-10-09 DIAGNOSIS — R252 Cramp and spasm: Secondary | ICD-10-CM

## 2013-10-09 DIAGNOSIS — M791 Myalgia, unspecified site: Secondary | ICD-10-CM

## 2013-10-09 DIAGNOSIS — E1140 Type 2 diabetes mellitus with diabetic neuropathy, unspecified: Secondary | ICD-10-CM

## 2013-10-09 DIAGNOSIS — E1149 Type 2 diabetes mellitus with other diabetic neurological complication: Secondary | ICD-10-CM

## 2013-10-09 DIAGNOSIS — R635 Abnormal weight gain: Secondary | ICD-10-CM

## 2013-10-09 DIAGNOSIS — E538 Deficiency of other specified B group vitamins: Secondary | ICD-10-CM

## 2013-10-09 DIAGNOSIS — N760 Acute vaginitis: Secondary | ICD-10-CM | POA: Insufficient documentation

## 2013-10-09 DIAGNOSIS — IMO0001 Reserved for inherently not codable concepts without codable children: Secondary | ICD-10-CM

## 2013-10-09 LAB — BASIC METABOLIC PANEL
BUN: 11 mg/dL (ref 6–23)
CO2: 32 mEq/L (ref 19–32)
Calcium: 8.6 mg/dL (ref 8.4–10.5)
Chloride: 97 mEq/L (ref 96–112)
Creatinine, Ser: 0.9 mg/dL (ref 0.4–1.2)
GFR: 75.91 mL/min (ref >60.00)
Glucose, Bld: 245 mg/dL — ABNORMAL HIGH (ref 70–99)
Potassium: 4 mEq/L (ref 3.5–5.1)
Sodium: 136 mEq/L (ref 135–145)

## 2013-10-09 LAB — HEMOGLOBIN A1C: Hgb A1c MFr Bld: 9 % — ABNORMAL HIGH (ref 4.6–6.5)

## 2013-10-09 LAB — VITAMIN B12: Vitamin B-12: 253 pg/mL (ref 211–911)

## 2013-10-09 MED ORDER — FLUCONAZOLE 150 MG PO TABS: 150.0000 mg | ORAL_TABLET | Freq: Once | ORAL | Status: DC

## 2013-10-09 MED ORDER — GLUCOSE BLOOD VI STRP: ORAL_STRIP | Status: DC

## 2013-10-09 MED ORDER — HYDROCODONE-ACETAMINOPHEN 10-325 MG PO TABS: ORAL_TABLET | ORAL | Status: DC

## 2013-10-09 NOTE — Assessment & Plan Note (Signed)
Continue with current prescription therapy as reflected on the Med list.  

## 2013-10-09 NOTE — Assessment & Plan Note (Signed)
Recurrent - poss due to Crestor

## 2013-10-09 NOTE — Patient Instructions (Signed)
Wt Readings from Last 3 Encounters:  10/09/13 266 lb (120.657 kg)  09/03/13 258 lb (117.028 kg)  08/27/13 258 lb (117.028 kg)

## 2013-10-09 NOTE — Assessment & Plan Note (Signed)
Hold crestor 

## 2013-10-09 NOTE — Assessment & Plan Note (Signed)
12/14 due to abx Diflucan po qd

## 2013-10-09 NOTE — Assessment & Plan Note (Signed)
Continue with current prescription therapy as reflected on the Med list. Loose wt 

## 2013-10-09 NOTE — Progress Notes (Signed)
   Subjective:    HPI  C/o leg cramps F/u LBP - worse - she slipped and almost fell last week  The patient presents for a follow-up of  chronic hypertension, chronic dyslipidemia, type 2 diabetes and FMS and LE neuropathy - asking to increase Gabapentin CBGs are high: work in progress w/Dr Everardo All - better Vicodin is helping w/LBP Tramadol is helping w/HA - not using together F/u L foot pain F/u anxiety, mood swing  Wt Readings from Last 3 Encounters:  10/09/13 266 lb (120.657 kg)  09/03/13 258 lb (117.028 kg)  08/27/13 258 lb (117.028 kg)   BP Readings from Last 3 Encounters:  10/09/13 136/74  09/30/13 129/85  09/03/13 121/82      Review of Systems  Constitutional: Negative for chills, activity change, appetite change, fatigue and unexpected weight change.  HENT: Negative for congestion, mouth sores and sinus pressure.   Eyes: Negative for visual disturbance.  Respiratory: Negative for cough and chest tightness.   Gastrointestinal: Negative for nausea and abdominal pain.  Genitourinary: Negative for frequency, difficulty urinating and vaginal pain.  Musculoskeletal: Positive for arthralgias. Negative for back pain and gait problem.  Skin: Negative for pallor and rash.  Neurological: Negative for dizziness, tremors, weakness, numbness and headaches.  Psychiatric/Behavioral: Negative for suicidal ideas, confusion and sleep disturbance. The patient is nervous/anxious.        Objective:   Physical Exam  Constitutional: She appears well-developed. No distress.  HENT:  Head: Normocephalic.  Right Ear: External ear normal.  Left Ear: External ear normal.  Nose: Nose normal.  Mouth/Throat: Oropharynx is clear and moist.  Eyes: Conjunctivae are normal. Pupils are equal, round, and reactive to light. Right eye exhibits no discharge. Left eye exhibits no discharge.  Neck: Normal range of motion. Neck supple. No JVD present. No tracheal deviation present. No thyromegaly  present.  Cardiovascular: Normal rate, regular rhythm and normal heart sounds.   Pulmonary/Chest: No stridor. No respiratory distress. She has no wheezes.  Abdominal: Soft. Bowel sounds are normal. She exhibits no distension and no mass. There is no tenderness. There is no rebound and no guarding.  Musculoskeletal: She exhibits tenderness (LS spine is tender, leg muscles hurt). She exhibits no edema.  Lymphadenopathy:    She has no cervical adenopathy.  Neurological: She displays normal reflexes. No cranial nerve deficit. She exhibits normal muscle tone. Coordination normal.  Skin: No rash noted. No erythema.  Psychiatric: She has a normal mood and affect. Her behavior is normal. Judgment and thought content normal.     Lab Results  Component Value Date   WBC 7.5 01/02/2013   HGB 14.1 01/02/2013   HCT 41.9 01/02/2013   PLT 253.0 01/02/2013   GLUCOSE 232* 01/02/2013   CHOL 142 01/02/2013   TRIG 144.0 01/02/2013   HDL 32.20* 01/02/2013   LDLDIRECT 160.5 03/19/2012   LDLCALC 81 01/02/2013   ALT 21 10/18/2011   AST 19 10/18/2011   NA 136 01/02/2013   K 4.7 01/02/2013   CL 102 01/02/2013   CREATININE 0.9 01/02/2013   BUN 16 01/02/2013   CO2 29 01/02/2013   TSH 1.67 01/02/2013   HGBA1C 7.8* 05/24/2013   MICROALBUR 0.3 12/19/2012        Assessment & Plan:

## 2013-10-09 NOTE — Assessment & Plan Note (Signed)
Wt Readings from Last 3 Encounters:  10/09/13 266 lb (120.657 kg)  09/03/13 258 lb (117.028 kg)  08/27/13 258 lb (117.028 kg)    

## 2013-10-23 ENCOUNTER — Encounter: Payer: Self-pay | Admitting: Internal Medicine

## 2013-11-05 ENCOUNTER — Other Ambulatory Visit: Payer: Self-pay | Admitting: *Deleted

## 2013-11-05 MED ORDER — LEVOTHYROXINE SODIUM 75 MCG PO TABS: 75.0000 ug | ORAL_TABLET | Freq: Every day | ORAL | Status: DC

## 2013-12-03 ENCOUNTER — Other Ambulatory Visit: Payer: Self-pay | Admitting: *Deleted

## 2013-12-03 MED ORDER — GABAPENTIN 800 MG PO TABS: 800.0000 mg | ORAL_TABLET | Freq: Three times a day (TID) | ORAL | Status: DC

## 2013-12-04 ENCOUNTER — Other Ambulatory Visit: Payer: Self-pay | Admitting: Internal Medicine

## 2013-12-05 ENCOUNTER — Telehealth: Payer: Self-pay | Admitting: *Deleted

## 2013-12-05 NOTE — Telephone Encounter (Signed)
OK to fill this prescription with additional refills x1 Thank you!  

## 2013-12-05 NOTE — Telephone Encounter (Signed)
Rf req for Zolpidem 10 mg for a 90 day supply. Please advise.

## 2013-12-10 ENCOUNTER — Ambulatory Visit: Payer: Medicare PPO | Admitting: Podiatry

## 2013-12-10 NOTE — Telephone Encounter (Signed)
Rx sent to pharmacy   

## 2013-12-11 ENCOUNTER — Other Ambulatory Visit: Payer: Self-pay | Admitting: *Deleted

## 2013-12-11 MED ORDER — LAMOTRIGINE 25 MG PO TABS: 25.0000 mg | ORAL_TABLET | Freq: Two times a day (BID) | ORAL | Status: DC

## 2014-01-01 ENCOUNTER — Ambulatory Visit (INDEPENDENT_AMBULATORY_CARE_PROVIDER_SITE_OTHER): Payer: Medicare PPO | Admitting: Internal Medicine

## 2014-01-01 ENCOUNTER — Encounter: Payer: Self-pay | Admitting: Internal Medicine

## 2014-01-01 VITALS — BP 130/90 | HR 80 | Temp 98.7°F | Resp 16 | Wt 269.0 lb

## 2014-01-01 DIAGNOSIS — M109 Gout, unspecified: Secondary | ICD-10-CM

## 2014-01-01 DIAGNOSIS — E1142 Type 2 diabetes mellitus with diabetic polyneuropathy: Secondary | ICD-10-CM

## 2014-01-01 DIAGNOSIS — M545 Low back pain, unspecified: Secondary | ICD-10-CM

## 2014-01-01 DIAGNOSIS — E538 Deficiency of other specified B group vitamins: Secondary | ICD-10-CM

## 2014-01-01 DIAGNOSIS — E1149 Type 2 diabetes mellitus with other diabetic neurological complication: Secondary | ICD-10-CM

## 2014-01-01 DIAGNOSIS — E1140 Type 2 diabetes mellitus with diabetic neuropathy, unspecified: Secondary | ICD-10-CM

## 2014-01-01 MED ORDER — HYDROCODONE-ACETAMINOPHEN 10-325 MG PO TABS: ORAL_TABLET | ORAL | Status: DC

## 2014-01-01 MED ORDER — LORATADINE 10 MG PO TABS: ORAL_TABLET | ORAL | Status: DC

## 2014-01-01 NOTE — Progress Notes (Signed)
   Subjective:    HPI  F/u leg cramps F/u LBP - worse - she slipped and almost fell last week  The patient presents for a follow-up of  chronic hypertension, chronic dyslipidemia, type 2 diabetes and FMS and LE neuropathy - asking to increase Gabapentin CBGs are high: work in progress w/Dr Loanne Drilling - better Vicodin is helping w/LBP Tramadol is helping w/HA - not using together F/u L foot pain F/u anxiety, mood swing  Wt Readings from Last 3 Encounters:  01/01/14 269 lb (122.018 kg)  10/09/13 266 lb (120.657 kg)  09/03/13 258 lb (117.028 kg)   BP Readings from Last 3 Encounters:  01/01/14 130/90  10/09/13 136/74  09/30/13 129/85      Review of Systems  Constitutional: Negative for chills, activity change, appetite change, fatigue and unexpected weight change.  HENT: Negative for congestion, mouth sores and sinus pressure.   Eyes: Negative for visual disturbance.  Respiratory: Negative for cough and chest tightness.   Gastrointestinal: Negative for nausea and abdominal pain.  Genitourinary: Negative for frequency, difficulty urinating and vaginal pain.  Musculoskeletal: Positive for arthralgias. Negative for back pain and gait problem.  Skin: Negative for pallor and rash.  Neurological: Negative for dizziness, tremors, weakness, numbness and headaches.  Psychiatric/Behavioral: Negative for suicidal ideas, confusion and sleep disturbance. The patient is nervous/anxious.        Objective:   Physical Exam  Constitutional: She appears well-developed. No distress.  HENT:  Head: Normocephalic.  Right Ear: External ear normal.  Left Ear: External ear normal.  Nose: Nose normal.  Mouth/Throat: Oropharynx is clear and moist.  Eyes: Conjunctivae are normal. Pupils are equal, round, and reactive to light. Right eye exhibits no discharge. Left eye exhibits no discharge.  Neck: Normal range of motion. Neck supple. No JVD present. No tracheal deviation present. No thyromegaly  present.  Cardiovascular: Normal rate, regular rhythm and normal heart sounds.   Pulmonary/Chest: No stridor. No respiratory distress. She has no wheezes.  Abdominal: Soft. Bowel sounds are normal. She exhibits no distension and no mass. There is no tenderness. There is no rebound and no guarding.  Musculoskeletal: She exhibits tenderness (LS spine is tender, leg muscles hurt). She exhibits no edema.  Lymphadenopathy:    She has no cervical adenopathy.  Neurological: She displays normal reflexes. No cranial nerve deficit. She exhibits normal muscle tone. Coordination normal.  Skin: No rash noted. No erythema.  Psychiatric: She has a normal mood and affect. Her behavior is normal. Judgment and thought content normal.     Lab Results  Component Value Date   WBC 7.5 01/02/2013   HGB 14.1 01/02/2013   HCT 41.9 01/02/2013   PLT 253.0 01/02/2013   GLUCOSE 245* 10/09/2013   CHOL 142 01/02/2013   TRIG 144.0 01/02/2013   HDL 32.20* 01/02/2013   LDLDIRECT 160.5 03/19/2012   LDLCALC 81 01/02/2013   ALT 21 10/18/2011   AST 19 10/18/2011   NA 136 10/09/2013   K 4.0 10/09/2013   CL 97 10/09/2013   CREATININE 0.9 10/09/2013   BUN 11 10/09/2013   CO2 32 10/09/2013   TSH 1.67 01/02/2013   HGBA1C 9.0* 10/09/2013   MICROALBUR 0.3 12/19/2012        Assessment & Plan:

## 2014-01-01 NOTE — Assessment & Plan Note (Signed)
Continue with current prescription therapy as reflected on the Med list.  

## 2014-01-01 NOTE — Progress Notes (Signed)
Pre visit review using our clinic review tool, if applicable. No additional management support is needed unless otherwise documented below in the visit note. 

## 2014-01-03 ENCOUNTER — Telehealth: Payer: Self-pay

## 2014-01-03 NOTE — Telephone Encounter (Signed)
Relevant patient education assigned to patient using Emmi. ° °

## 2014-01-13 ENCOUNTER — Ambulatory Visit (INDEPENDENT_AMBULATORY_CARE_PROVIDER_SITE_OTHER): Payer: Medicare PPO | Admitting: Endocrinology

## 2014-01-13 ENCOUNTER — Encounter: Payer: Self-pay | Admitting: Internal Medicine

## 2014-01-13 ENCOUNTER — Encounter: Payer: Self-pay | Admitting: Endocrinology

## 2014-01-13 VITALS — BP 118/64 | HR 87 | Temp 97.7°F | Ht 69.5 in | Wt 266.0 lb

## 2014-01-13 DIAGNOSIS — E1142 Type 2 diabetes mellitus with diabetic polyneuropathy: Secondary | ICD-10-CM

## 2014-01-13 DIAGNOSIS — E1140 Type 2 diabetes mellitus with diabetic neuropathy, unspecified: Secondary | ICD-10-CM

## 2014-01-13 DIAGNOSIS — E1149 Type 2 diabetes mellitus with other diabetic neurological complication: Secondary | ICD-10-CM

## 2014-01-13 LAB — HEMOGLOBIN A1C
Hgb A1c MFr Bld: 8.4 % — ABNORMAL HIGH (ref <5.7)
Mean Plasma Glucose: 194 mg/dL — ABNORMAL HIGH (ref <117)

## 2014-01-13 NOTE — Progress Notes (Signed)
Subjective:    Patient ID: Katherine Herrera, female    DOB: [birth year], 48 y.o.   MRN: [id number]  HPI pt returns for f/u of type 2 DM (dx'ed 2006, not in the context of pancreatitis; she has moderate sensory neuropathy of the lower extremities, but no associated complications; her last episode of severe hypoglycemia was in late 2013; she has never had DKA; she takes multiple daily injections; she takes human insulin, for cost reasons).  no cbg record, but states cbg's are lowest in the afternoon (90).  It is higher at other times of day (200's).  It is highest before lunch.  It is higher at hs, than in am.   Past Medical History  Diagnosis Date  . Syncope     poss conversion disorder vs psychogenic seizures  . DM (diabetes mellitus)   . Hypothyroidism   . LBP (low back pain)     DR Patrice Paradise  . Peripheral neuropathy   . COPD (chronic obstructive pulmonary disease)   . Vitamin D deficiency   . Migraine   . Hyperlipidemia   . Anxiety   . Gout     possible  . Tachycardia     diet coke overuse?  . TTS (tarsal tunnel syndrome) 2010    Left Foot [facility] in [place]    Past Surgical History  Procedure Laterality Date  . Cervical fusion      C4-5  . Abdominal hysterectomy    . Carpal tunnel release  2010    LEFT    History   Social History  . Marital Status: Married    Spouse Name: N/A    Number of Children: N/A  . Years of Education: N/A   Occupational History  . Not on file.   Social History Main Topics  . Smoking status: Former Smoker    Quit date: 12/01/2009  . Smokeless tobacco: Never Used  . Alcohol Use: No  . Drug Use: No  . Sexual Activity: Yes   Other Topics Concern  . Not on file   Social History Narrative   GYN - Dr Radene Knee      FAMILY HISTORY   Lung cancer   Mother w/alcoholism      Married 3d GS due Nov   Diet Coke: 10 cans a day      Regular exercise - NO      Former smoker 2009 restared 2010 stopped 12/2009    Current Outpatient Prescriptions on  File Prior to Visit  Medication Sig Dispense Refill  . ACCU-CHEK SOFTCLIX LANCETS lancets       . B-D INS SYRINGE 0.5CC/31GX5/16 31G X 5/16" 0.5 ML MISC       . baclofen (LIORESAL) 10 MG tablet Take 2 tablets (20 mg total) by mouth 2 (two) times daily.  360 each  1  . BD PEN NEEDLE NANO U/F 32G X 4 MM MISC       . Blood Glucose Monitoring Suppl (ACCU-CHEK AVIVA PLUS) W/DEVICE KIT USE AS DIRECTED  1 kit  0  . Cholecalciferol (VITAMIN D3) 1000 UNIT tablet Take 2,000 Units by mouth daily.        . clonazePAM (KLONOPIN) 0.5 MG tablet TAKE ONE TABLET BY MOUTH 2 TIMES A DAY AS NEEDED FOR ANXIETY  60 tablet  2  . estradiol (ESTRACE) 1 MG tablet Take 1 mg by mouth daily.        . fluconazole (DIFLUCAN) 150 MG tablet Take 1 tablet (150 mg total) by  mouth once.  2 tablet  2  . gabapentin (NEURONTIN) 800 MG tablet Take 1 tablet (800 mg total) by mouth 3 (three) times daily.  270 tablet  1  . glucose blood (ACCU-CHEK AVIVA PLUS) test strip As directed  100 each  11  . HYDROcodone-acetaminophen (NORCO) 10-325 MG per tablet TAKE ONE TABLET BY MOUTH EVERY 6 HOURS AS NEEDED FOR PAIN  Please fill on or after 03/09/14  120 tablet  0  . lamoTRIgine (LAMICTAL) 25 MG tablet Take 1 tablet (25 mg total) by mouth 2 (two) times daily.  180 tablet  1  . levothyroxine (SYNTHROID, LEVOTHROID) 75 MCG tablet Take 1 tablet (75 mcg total) by mouth daily.  90 tablet  3  . loratadine (ALLERGY) 10 MG tablet TAKE ONE TABLET BY MOUTH EVERY DAY  90 tablet  3  . rosuvastatin (CRESTOR) 10 MG tablet Take 1 tablet (10 mg total) by mouth daily.  21 tablet  0  . traMADol (ULTRAM) 50 MG tablet Take 1-2 tablets (50-100 mg total) by mouth 2 (two) times daily as needed (headache).  180 tablet  0  . vitamin B-12 (CYANOCOBALAMIN) 1000 MCG tablet Take 1,000 mcg by mouth daily.        Marland Kitchen zolpidem (AMBIEN) 10 MG tablet Take 0.5-1 tablets (5-10 mg total) by mouth at bedtime as needed. For insomnia  90 tablet  1   No current facility-administered  medications on file prior to visit.    Allergies  Allergen Reactions  . Crestor [Rosuvastatin Calcium]     myalgia  . Lovastatin   . Meperidine Hcl   . Metformin Nausea And Vomiting  . Niacin   . Paroxetine     Family History  Problem Relation Age of Onset  . Diabetes Mother   . Hypertension Mother   . Cancer Father 43    ? bone cancer    BP 118/64  Pulse 87  Temp(Src) 97.7 F (36.5 C) (Oral)  Ht 5' 9.5" (1.765 m)  Wt 266 lb (120.657 kg)  BMI 38.73 kg/m2  SpO2 92%  Review of Systems She denies hypoglycemia and weight change    Objective:   Physical Exam VITAL SIGNS:  See vs page GENERAL: no distress    Lab Results  Component Value Date   HGBA1C 8.4* 01/13/2014      Assessment & Plan:  DM: she needs increased rx Neuropathy: this limits exercise rx of DM.

## 2014-01-13 NOTE — Patient Instructions (Addendum)
check your blood sugar twice a day.  vary the time of day when you check, between before the 3 meals, and at bedtime.  also check if you have symptoms of your blood sugar being too high or too low.  please keep a record of the readings and bring it to your next appointment here.  please call us sooner if your blood sugar goes below 70, or if it stays over 200.   please come back for a follow-up appointment in 3 months. A diabetes blood test is requested for you today.  We'll contact you with results.       Purine Restricted Diet A low-purine diet consists of foods that reduce uric acid made in your body. INDICATIONS FOR USE  Your caregiver may ask you to follow a low-purine diet to reduce gout flairs.  GUIDELINES  Avoid high-purine foods, including all alcohol, yeast extracts taken as supplements, and sauces made from meats (like gravy). Do not eat high-purine meats, including anchovies, sardines, herring, mussels, tuna, codfish, scallops, trout, haddock, bacon, organ meats, tripe, goose, wild game, and sweetbreads.  Grains  Allowed/Recommended: All, except those listed to consume in moderation.  Consume in Moderation: Oatmeal ( cup uncooked daily), wheat bran or germ ( cup daily), and whole grains. Vegetables  Allowed/Recommended: All, except those listed to consume in moderation.  Consume in Moderation: Asparagus, cauliflower, spinach, mushrooms, and green peas ( cup daily). Fruit  Allowed/Recommended: All.  Consume in Moderation: None. Meat and Meat Substitutes  Allowed/Recommended: Eggs, nuts, and peanut butter.  Consume in Moderation: Limit to 4 to 6 oz daily. Avoid high-purine meats. Lentils, peas, and dried beans (1 cup daily). Milk  Allowed/Recommended: All. Choose low-fat or skim when possible.  Consume in Moderation: None. Fats and Oils  Allowed/Recommended: All.  Consume in Moderation: None. Beverages  Allowed/Recommended: All, except those listed to  avoid.  Avoid: All alcohol. Condiments/Miscellaneous  Allowed/Recommended: All, except those listed to consume in moderation.  Consume in Moderation: Bouillon and meat-based broths and soups. Document Released: 02/11/2011 Document Revised: 01/09/2012 Document Reviewed: 02/11/2011 [facility] Patient Information 2014 [place].

## 2014-01-14 LAB — MICROALBUMIN / CREATININE URINE RATIO
Creatinine, Urine: 64 mg/dL
Microalb Creat Ratio: 7.8 mg/g (ref 0.0–30.0)
Microalb, Ur: 0.5 mg/dL (ref 0.00–1.89)

## 2014-01-20 ENCOUNTER — Other Ambulatory Visit: Payer: Self-pay | Admitting: Internal Medicine

## 2014-01-20 DIAGNOSIS — M25579 Pain in unspecified ankle and joints of unspecified foot: Secondary | ICD-10-CM

## 2014-01-21 ENCOUNTER — Encounter: Payer: Self-pay | Admitting: Internal Medicine

## 2014-01-21 ENCOUNTER — Ambulatory Visit (INDEPENDENT_AMBULATORY_CARE_PROVIDER_SITE_OTHER): Payer: Medicare PPO | Admitting: Internal Medicine

## 2014-01-21 ENCOUNTER — Other Ambulatory Visit (INDEPENDENT_AMBULATORY_CARE_PROVIDER_SITE_OTHER): Payer: Medicare PPO

## 2014-01-21 VITALS — BP 120/78 | HR 68 | Temp 98.3°F | Resp 16 | Wt 264.0 lb

## 2014-01-21 DIAGNOSIS — E538 Deficiency of other specified B group vitamins: Secondary | ICD-10-CM

## 2014-01-21 DIAGNOSIS — R21 Rash and other nonspecific skin eruption: Secondary | ICD-10-CM

## 2014-01-21 DIAGNOSIS — E1149 Type 2 diabetes mellitus with other diabetic neurological complication: Secondary | ICD-10-CM

## 2014-01-21 DIAGNOSIS — E1140 Type 2 diabetes mellitus with diabetic neuropathy, unspecified: Secondary | ICD-10-CM

## 2014-01-21 DIAGNOSIS — M545 Low back pain, unspecified: Secondary | ICD-10-CM

## 2014-01-21 DIAGNOSIS — E1142 Type 2 diabetes mellitus with diabetic polyneuropathy: Secondary | ICD-10-CM

## 2014-01-21 LAB — CBC WITH DIFFERENTIAL/PLATELET
Basophils Absolute: 0.1 10*3/uL (ref 0.0–0.1)
Basophils Relative: 1 % (ref 0.0–3.0)
Eosinophils Absolute: 0.2 10*3/uL (ref 0.0–0.7)
Eosinophils Relative: 1.2 % (ref 0.0–5.0)
HCT: 40.5 % (ref 36.0–46.0)
Hemoglobin: 13.4 g/dL (ref 12.0–15.0)
Lymphocytes Relative: 16.2 % (ref 12.0–46.0)
Lymphs Abs: 2.1 10*3/uL (ref 0.7–4.0)
MCHC: 33.1 g/dL (ref 30.0–36.0)
MCV: 89.2 fl (ref 78.0–100.0)
Monocytes Absolute: 0.7 10*3/uL (ref 0.1–1.0)
Monocytes Relative: 5.2 % (ref 3.0–12.0)
Neutro Abs: 10.1 10*3/uL — ABNORMAL HIGH (ref 1.4–7.7)
Neutrophils Relative %: 76.4 % (ref 43.0–77.0)
Platelets: 296 10*3/uL (ref 150.0–400.0)
RBC: 4.54 Mil/uL (ref 3.87–5.11)
RDW: 14 % (ref 11.5–14.6)
WBC: 13.2 10*3/uL — ABNORMAL HIGH (ref 4.5–10.5)

## 2014-01-21 LAB — BASIC METABOLIC PANEL
BUN: 9 mg/dL (ref 6–23)
CO2: 29 mEq/L (ref 19–32)
Calcium: 9.1 mg/dL (ref 8.4–10.5)
Chloride: 95 mEq/L — ABNORMAL LOW (ref 96–112)
Creatinine, Ser: 0.9 mg/dL (ref 0.4–1.2)
GFR: 70.08 mL/min (ref >60.00)
Glucose, Bld: 212 mg/dL — ABNORMAL HIGH (ref 70–99)
Potassium: 4.1 mEq/L (ref 3.5–5.1)
Sodium: 134 mEq/L — ABNORMAL LOW (ref 135–145)

## 2014-01-21 LAB — URINALYSIS
Bilirubin Urine: NEGATIVE
Ketones, ur: NEGATIVE
Leukocytes, UA: NEGATIVE
Nitrite: NEGATIVE
Specific Gravity, Urine: >=1.03 — AB (ref 1.000–1.030)
Urine Glucose: 250 — AB
Urobilinogen, UA: 1 (ref 0.0–1.0)
pH: 5.5 (ref 5.0–8.0)

## 2014-01-21 LAB — HEPATIC FUNCTION PANEL
ALT: 16 U/L (ref 0–35)
AST: 26 U/L (ref 0–37)
Albumin: 3.5 g/dL (ref 3.5–5.2)
Alkaline Phosphatase: 100 U/L (ref 39–117)
Bilirubin, Direct: 0.2 mg/dL (ref 0.0–0.3)
Total Bilirubin: 0.8 mg/dL (ref 0.3–1.2)
Total Protein: 7.8 g/dL (ref 6.0–8.3)

## 2014-01-21 LAB — SEDIMENTATION RATE: Sed Rate: 72 mm/hr — ABNORMAL HIGH (ref 0–22)

## 2014-01-21 LAB — RHEUMATOID FACTOR: Rhuematoid fact SerPl-aCnc: 14 IU/mL (ref <=14)

## 2014-01-21 LAB — CK: Total CK: 120 U/L (ref 7–177)

## 2014-01-21 MED ORDER — DOXYCYCLINE HYCLATE 100 MG PO TABS: 100.0000 mg | ORAL_TABLET | Freq: Two times a day (BID) | ORAL | Status: DC

## 2014-01-21 MED ORDER — METHYLPREDNISOLONE ACETATE 80 MG/ML IJ SUSP
80.0000 mg | Freq: Once | INTRAMUSCULAR | Status: AC
  Administered 2014-01-21: 80 mg via INTRAMUSCULAR

## 2014-01-21 MED ORDER — TRIAMCINOLONE ACETONIDE 0.5 % EX OINT: 1.0000 "application " | TOPICAL_OINTMENT | Freq: Two times a day (BID) | CUTANEOUS | Status: DC

## 2014-01-21 NOTE — Progress Notes (Signed)
Pre visit review using our clinic review tool, if applicable. No additional management support is needed unless otherwise documented below in the visit note. 

## 2014-01-21 NOTE — Progress Notes (Signed)
Subjective:    Rash This is a new problem. The current episode started 1 to 4 weeks ago. The problem has been gradually worsening since onset. The affected locations include the right ankle, left lower leg, right lower leg and left ankle. The rash is characterized by blistering, burning, pain and redness. She was exposed to nothing. Pertinent negatives include no congestion, cough, fatigue or shortness of breath. Past treatments include analgesics and anti-itch cream. The treatment provided no relief. There is no history of eczema.    C/o painful burning rash on B LEs x 10 d; L>R F/u leg cramps F/u LBP - worse - she slipped and almost fell last week  The patient presents for a follow-up of  chronic hypertension, chronic dyslipidemia, type 2 diabetes and FMS and LE neuropathy - asking to increase Gabapentin CBGs are high: work in progress w/Dr Loanne Drilling - better Vicodin is helping w/LBP Tramadol is helping w/HA - not using together F/u L foot pain F/u anxiety, mood swing  Wt Readings from Last 3 Encounters:  01/21/14 264 lb (119.75 kg)  01/13/14 266 lb (120.657 kg)  01/01/14 269 lb (122.018 kg)   BP Readings from Last 3 Encounters:  01/21/14 120/78  01/13/14 118/64  01/01/14 130/90      Review of Systems  Constitutional: Negative for chills, activity change, appetite change, fatigue and unexpected weight change.  HENT: Negative for congestion, mouth sores and sinus pressure.   Eyes: Negative for visual disturbance.  Respiratory: Negative for cough, chest tightness and shortness of breath.   Gastrointestinal: Negative for nausea and abdominal pain.  Genitourinary: Negative for frequency, difficulty urinating and vaginal pain.  Musculoskeletal: Positive for arthralgias. Negative for back pain and gait problem.  Skin: Negative for pallor and rash.  Neurological: Negative for dizziness, tremors, weakness, numbness and headaches.  Psychiatric/Behavioral: Negative for suicidal  ideas, confusion and sleep disturbance. The patient is nervous/anxious.        Objective:   Physical Exam  Constitutional: She appears well-developed. No distress.  HENT:  Head: Normocephalic.  Right Ear: External ear normal.  Left Ear: External ear normal.  Nose: Nose normal.  Mouth/Throat: Oropharynx is clear and moist.  Eyes: Conjunctivae are normal. Pupils are equal, round, and reactive to light. Right eye exhibits no discharge. Left eye exhibits no discharge.  Neck: Normal range of motion. Neck supple. No JVD present. No tracheal deviation present. No thyromegaly present.  Cardiovascular: Normal rate, regular rhythm and normal heart sounds.   Pulmonary/Chest: No stridor. No respiratory distress. She has no wheezes.  Abdominal: Soft. Bowel sounds are normal. She exhibits no distension and no mass. There is no tenderness. There is no rebound and no guarding.  Musculoskeletal: She exhibits tenderness (LS spine is tender, leg muscles hurt). She exhibits no edema.  Lymphadenopathy:    She has no cervical adenopathy.  Neurological: She displays normal reflexes. No cranial nerve deficit. She exhibits normal muscle tone. Coordination normal.  Skin: No rash noted. No erythema.  Psychiatric: She has a normal mood and affect. Her behavior is normal. Judgment and thought content normal.  Ectima type necrotic rash on L and RLE 0.2-1 cm each (L>>R) Trace edema   Lab Results  Component Value Date   WBC 7.5 01/02/2013   HGB 14.1 01/02/2013   HCT 41.9 01/02/2013   PLT 253.0 01/02/2013   GLUCOSE 245* 10/09/2013   CHOL 142 01/02/2013   TRIG 144.0 01/02/2013   HDL 32.20* 01/02/2013   LDLDIRECT 160.5 03/19/2012  LDLCALC 81 01/02/2013   ALT 21 10/18/2011   AST 19 10/18/2011   NA 136 10/09/2013   K 4.0 10/09/2013   CL 97 10/09/2013   CREATININE 0.9 10/09/2013   BUN 11 10/09/2013   CO2 32 10/09/2013   TSH 1.67 01/02/2013   HGBA1C 8.4* 01/13/2014   MICROALBUR 0.50 01/13/2014        Assessment & Plan:

## 2014-01-21 NOTE — Assessment & Plan Note (Signed)
3/15 Ectima type necrotic rash on L and RLE 0.2-1 cm each (L>>R) Probable vasculitis Derm cons Empiric abx/Depomedrol/cream Pain meds Labs

## 2014-01-21 NOTE — Assessment & Plan Note (Signed)
Improve CBG control 

## 2014-01-21 NOTE — Assessment & Plan Note (Signed)
Continue with current prescription therapy as reflected on the Med list.  

## 2014-01-22 LAB — ANA: Anti Nuclear Antibody(ANA): NEGATIVE

## 2014-01-24 ENCOUNTER — Ambulatory Visit (INDEPENDENT_AMBULATORY_CARE_PROVIDER_SITE_OTHER): Payer: Medicare PPO | Admitting: Podiatry

## 2014-01-24 ENCOUNTER — Encounter: Payer: Self-pay | Admitting: Podiatry

## 2014-01-24 VITALS — BP 115/76 | HR 88 | Ht 69.0 in | Wt 264.0 lb

## 2014-01-24 DIAGNOSIS — L851 Acquired keratosis [keratoderma] palmaris et plantaris: Secondary | ICD-10-CM

## 2014-01-24 DIAGNOSIS — M79606 Pain in leg, unspecified: Secondary | ICD-10-CM | POA: Insufficient documentation

## 2014-01-24 DIAGNOSIS — M79609 Pain in unspecified limb: Secondary | ICD-10-CM

## 2014-01-24 DIAGNOSIS — L570 Actinic keratosis: Secondary | ICD-10-CM | POA: Insufficient documentation

## 2014-01-24 NOTE — Patient Instructions (Signed)
Seen for pain under ball of right foot. Thick callus debrided. Return as needed.

## 2014-01-24 NOTE — Progress Notes (Signed)
Subjective: 48 year old female presents accompanied by her husband complaining of pain under callused right foot.  Stated that she was recently diagnosed with Vasculitis that caused multiple spots of blackend red skin erruptions in both legs and feet.  She is under her PCP care with antibiotics and cream for the past 2 weeks.  Today patient points thick callused area under the 5th MPJ of right foot being very painful for several months.   Objective:  Painful plantar lateral 5th MPJ callus right foot. Tick heel calluses bilateral R>L.  Hypermobile first ray right.  Cavus type foot with forefoot varus right.   Assessment:  Painful plantar callus under 5th MPJ right. Hypermobile first ray right.  Recent erruption of Vasculitis in lower limbs. Existing Fibromyalgia.   Plan: Painful calluses debrided. Return as needed.

## 2014-01-28 ENCOUNTER — Inpatient Hospital Stay (HOSPITAL_COMMUNITY)
Admission: EM | Admit: 2014-01-28 | Discharge: 2014-01-31 | DRG: 546 | Disposition: A | Discharge status: Home / Self Care | Payer: Medicare PPO | Attending: Internal Medicine | Admitting: Internal Medicine

## 2014-01-28 ENCOUNTER — Encounter (HOSPITAL_COMMUNITY): Payer: Self-pay | Admitting: Emergency Medicine

## 2014-01-28 DIAGNOSIS — F17200 Nicotine dependence, unspecified, uncomplicated: Secondary | ICD-10-CM

## 2014-01-28 DIAGNOSIS — M79675 Pain in left toe(s): Secondary | ICD-10-CM

## 2014-01-28 DIAGNOSIS — E1142 Type 2 diabetes mellitus with diabetic polyneuropathy: Secondary | ICD-10-CM | POA: Diagnosis present

## 2014-01-28 DIAGNOSIS — N760 Acute vaginitis: Secondary | ICD-10-CM

## 2014-01-28 DIAGNOSIS — Z794 Long term (current) use of insulin: Secondary | ICD-10-CM

## 2014-01-28 DIAGNOSIS — L570 Actinic keratosis: Secondary | ICD-10-CM

## 2014-01-28 DIAGNOSIS — Z113 Encounter for screening for infections with a predominantly sexual mode of transmission: Secondary | ICD-10-CM

## 2014-01-28 DIAGNOSIS — R799 Abnormal finding of blood chemistry, unspecified: Secondary | ICD-10-CM

## 2014-01-28 DIAGNOSIS — M2550 Pain in unspecified joint: Secondary | ICD-10-CM

## 2014-01-28 DIAGNOSIS — E1140 Type 2 diabetes mellitus with diabetic neuropathy, unspecified: Secondary | ICD-10-CM

## 2014-01-28 DIAGNOSIS — M25529 Pain in unspecified elbow: Secondary | ICD-10-CM

## 2014-01-28 DIAGNOSIS — I10 Essential (primary) hypertension: Secondary | ICD-10-CM | POA: Diagnosis present

## 2014-01-28 DIAGNOSIS — E039 Hypothyroidism, unspecified: Secondary | ICD-10-CM

## 2014-01-28 DIAGNOSIS — E119 Type 2 diabetes mellitus without complications: Secondary | ICD-10-CM

## 2014-01-28 DIAGNOSIS — R252 Cramp and spasm: Secondary | ICD-10-CM

## 2014-01-28 DIAGNOSIS — J019 Acute sinusitis, unspecified: Secondary | ICD-10-CM

## 2014-01-28 DIAGNOSIS — D692 Other nonthrombocytopenic purpura: Secondary | ICD-10-CM

## 2014-01-28 DIAGNOSIS — R609 Edema, unspecified: Secondary | ICD-10-CM

## 2014-01-28 DIAGNOSIS — M545 Low back pain, unspecified: Secondary | ICD-10-CM

## 2014-01-28 DIAGNOSIS — R109 Unspecified abdominal pain: Secondary | ICD-10-CM

## 2014-01-28 DIAGNOSIS — E538 Deficiency of other specified B group vitamins: Secondary | ICD-10-CM

## 2014-01-28 DIAGNOSIS — R233 Spontaneous ecchymoses: Secondary | ICD-10-CM | POA: Diagnosis present

## 2014-01-28 DIAGNOSIS — J449 Chronic obstructive pulmonary disease, unspecified: Secondary | ICD-10-CM

## 2014-01-28 DIAGNOSIS — K5289 Other specified noninfective gastroenteritis and colitis: Secondary | ICD-10-CM

## 2014-01-28 DIAGNOSIS — M109 Gout, unspecified: Secondary | ICD-10-CM

## 2014-01-28 DIAGNOSIS — R000 Tachycardia, unspecified: Secondary | ICD-10-CM

## 2014-01-28 DIAGNOSIS — R209 Unspecified disturbances of skin sensation: Secondary | ICD-10-CM

## 2014-01-28 DIAGNOSIS — Z79899 Other long term (current) drug therapy: Secondary | ICD-10-CM

## 2014-01-28 DIAGNOSIS — G609 Hereditary and idiopathic neuropathy, unspecified: Secondary | ICD-10-CM

## 2014-01-28 DIAGNOSIS — L02419 Cutaneous abscess of limb, unspecified: Secondary | ICD-10-CM | POA: Diagnosis present

## 2014-01-28 DIAGNOSIS — M6700 Short Achilles tendon (acquired), unspecified ankle: Secondary | ICD-10-CM

## 2014-01-28 DIAGNOSIS — M79606 Pain in leg, unspecified: Secondary | ICD-10-CM

## 2014-01-28 DIAGNOSIS — M79673 Pain in unspecified foot: Secondary | ICD-10-CM

## 2014-01-28 DIAGNOSIS — R635 Abnormal weight gain: Secondary | ICD-10-CM

## 2014-01-28 DIAGNOSIS — M79609 Pain in unspecified limb: Secondary | ICD-10-CM

## 2014-01-28 DIAGNOSIS — Q667 Congenital pes cavus, unspecified foot: Secondary | ICD-10-CM

## 2014-01-28 DIAGNOSIS — Z87891 Personal history of nicotine dependence: Secondary | ICD-10-CM

## 2014-01-28 DIAGNOSIS — E785 Hyperlipidemia, unspecified: Secondary | ICD-10-CM

## 2014-01-28 DIAGNOSIS — M65979 Unspecified synovitis and tenosynovitis, unspecified ankle and foot: Secondary | ICD-10-CM

## 2014-01-28 DIAGNOSIS — G5750 Tarsal tunnel syndrome, unspecified lower limb: Secondary | ICD-10-CM

## 2014-01-28 DIAGNOSIS — L959 Vasculitis limited to the skin, unspecified: Secondary | ICD-10-CM

## 2014-01-28 DIAGNOSIS — M25519 Pain in unspecified shoulder: Secondary | ICD-10-CM

## 2014-01-28 DIAGNOSIS — L03119 Cellulitis of unspecified part of limb: Secondary | ICD-10-CM

## 2014-01-28 DIAGNOSIS — I776 Arteritis, unspecified: Principal | ICD-10-CM

## 2014-01-28 DIAGNOSIS — M791 Myalgia, unspecified site: Secondary | ICD-10-CM

## 2014-01-28 DIAGNOSIS — F411 Generalized anxiety disorder: Secondary | ICD-10-CM

## 2014-01-28 DIAGNOSIS — E1149 Type 2 diabetes mellitus with other diabetic neurological complication: Secondary | ICD-10-CM | POA: Diagnosis present

## 2014-01-28 DIAGNOSIS — M659 Synovitis and tenosynovitis, unspecified: Secondary | ICD-10-CM

## 2014-01-28 DIAGNOSIS — Z981 Arthrodesis status: Secondary | ICD-10-CM

## 2014-01-28 DIAGNOSIS — M25579 Pain in unspecified ankle and joints of unspecified foot: Secondary | ICD-10-CM

## 2014-01-28 DIAGNOSIS — J4489 Other specified chronic obstructive pulmonary disease: Secondary | ICD-10-CM

## 2014-01-28 DIAGNOSIS — J069 Acute upper respiratory infection, unspecified: Secondary | ICD-10-CM

## 2014-01-28 DIAGNOSIS — IMO0002 Reserved for concepts with insufficient information to code with codable children: Secondary | ICD-10-CM

## 2014-01-28 DIAGNOSIS — M21961 Unspecified acquired deformity of right lower leg: Secondary | ICD-10-CM

## 2014-01-28 DIAGNOSIS — R21 Rash and other nonspecific skin eruption: Secondary | ICD-10-CM

## 2014-01-28 DIAGNOSIS — L600 Ingrowing nail: Secondary | ICD-10-CM

## 2014-01-28 LAB — APTT: aPTT: 33 seconds (ref 24–37)

## 2014-01-28 LAB — CBC WITH DIFFERENTIAL/PLATELET
Basophils Absolute: 0 10*3/uL (ref 0.0–0.1)
Basophils Relative: 0 % (ref 0–1)
Eosinophils Absolute: 0.1 10*3/uL (ref 0.0–0.7)
Eosinophils Relative: 1 % (ref 0–5)
HCT: 43 % (ref 36.0–46.0)
Hemoglobin: 14.6 g/dL (ref 12.0–15.0)
Lymphocytes Relative: 26 % (ref 12–46)
Lymphs Abs: 2.5 10*3/uL (ref 0.7–4.0)
MCH: 30.4 pg (ref 26.0–34.0)
MCHC: 34 g/dL (ref 30.0–36.0)
MCV: 89.6 fL (ref 78.0–100.0)
Monocytes Absolute: 0.6 10*3/uL (ref 0.1–1.0)
Monocytes Relative: 6 % (ref 3–12)
Neutro Abs: 6.4 10*3/uL (ref 1.7–7.7)
Neutrophils Relative %: 67 % (ref 43–77)
Platelets: 342 10*3/uL (ref 150–400)
RBC: 4.8 MIL/uL (ref 3.87–5.11)
RDW: 13.6 % (ref 11.5–15.5)
WBC: 9.7 10*3/uL (ref 4.0–10.5)

## 2014-01-28 LAB — COMPREHENSIVE METABOLIC PANEL
ALT: 11 U/L (ref 0–35)
AST: 17 U/L (ref 0–37)
Albumin: 3.2 g/dL — ABNORMAL LOW (ref 3.5–5.2)
Alkaline Phosphatase: 126 U/L — ABNORMAL HIGH (ref 39–117)
BUN: 10 mg/dL (ref 6–23)
CO2: 28 mEq/L (ref 19–32)
Calcium: 9.7 mg/dL (ref 8.4–10.5)
Chloride: 99 mEq/L (ref 96–112)
Creatinine, Ser: 0.72 mg/dL (ref 0.50–1.10)
GFR calc Af Amer: >90 mL/min (ref >90)
GFR calc non Af Amer: >90 mL/min (ref >90)
Glucose, Bld: 115 mg/dL — ABNORMAL HIGH (ref 70–99)
Potassium: 4.6 mEq/L (ref 3.7–5.3)
Sodium: 139 mEq/L (ref 137–147)
Total Bilirubin: 0.4 mg/dL (ref 0.3–1.2)
Total Protein: 8 g/dL (ref 6.0–8.3)

## 2014-01-28 LAB — C-REACTIVE PROTEIN: CRP: 5.2 mg/dL — ABNORMAL HIGH (ref <0.60)

## 2014-01-28 LAB — PROTIME-INR
INR: 0.95 (ref 0.00–1.49)
Prothrombin Time: 12.5 seconds (ref 11.6–15.2)

## 2014-01-28 LAB — GLUCOSE, CAPILLARY: Glucose-Capillary: 396 mg/dL — ABNORMAL HIGH (ref 70–99)

## 2014-01-28 LAB — SEDIMENTATION RATE: Sed Rate: 50 mm/hr — ABNORMAL HIGH (ref 0–22)

## 2014-01-28 LAB — C3 COMPLEMENT: C3 Complement: 193 mg/dL — ABNORMAL HIGH (ref 90–180)

## 2014-01-28 LAB — C4 COMPLEMENT: Complement C4, Body Fluid: 31 mg/dL (ref 10–40)

## 2014-01-28 LAB — I-STAT CG4 LACTIC ACID, ED: Lactic Acid, Venous: 0.98 mmol/L (ref 0.5–2.2)

## 2014-01-28 MED ORDER — ONDANSETRON HCL 4 MG PO TABS: 4.0000 mg | ORAL_TABLET | Freq: Four times a day (QID) | ORAL | Status: DC | PRN

## 2014-01-28 MED ORDER — ESTRADIOL 1 MG PO TABS
1.0000 mg | ORAL_TABLET | Freq: Every day | ORAL | Status: DC
  Administered 2014-01-28 – 2014-01-31 (×4): 1 mg via ORAL
  Filled 2014-01-28 (×4): qty 1

## 2014-01-28 MED ORDER — ALUM & MAG HYDROXIDE-SIMETH 200-200-20 MG/5ML PO SUSP: 30.0000 mL | Freq: Four times a day (QID) | ORAL | Status: DC | PRN

## 2014-01-28 MED ORDER — ACETAMINOPHEN 325 MG PO TABS: 650.0000 mg | ORAL_TABLET | Freq: Four times a day (QID) | ORAL | Status: DC | PRN

## 2014-01-28 MED ORDER — ONDANSETRON HCL 4 MG/2ML IJ SOLN
4.0000 mg | Freq: Once | INTRAMUSCULAR | Status: AC
  Administered 2014-01-28: 4 mg via INTRAVENOUS
  Filled 2014-01-28: qty 2

## 2014-01-28 MED ORDER — VANCOMYCIN HCL IN DEXTROSE 1-5 GM/200ML-% IV SOLN
1000.0000 mg | Freq: Three times a day (TID) | INTRAVENOUS | Status: DC
  Administered 2014-01-29 (×2): 1000 mg via INTRAVENOUS
  Filled 2014-01-28 (×4): qty 200

## 2014-01-28 MED ORDER — INSULIN NPH (HUMAN) (ISOPHANE) 100 UNIT/ML ~~LOC~~ SUSP
35.0000 [IU] | Freq: Every day | SUBCUTANEOUS | Status: DC
  Administered 2014-01-28: 35 [IU] via SUBCUTANEOUS
  Filled 2014-01-28: qty 10

## 2014-01-28 MED ORDER — BACLOFEN 20 MG PO TABS
20.0000 mg | ORAL_TABLET | Freq: Two times a day (BID) | ORAL | Status: DC
  Administered 2014-01-28 – 2014-01-31 (×6): 20 mg via ORAL
  Filled 2014-01-28 (×7): qty 1

## 2014-01-28 MED ORDER — METHYLPREDNISOLONE SODIUM SUCC 125 MG IJ SOLR
125.0000 mg | Freq: Once | INTRAMUSCULAR | Status: AC
  Administered 2014-01-28: 125 mg via INTRAVENOUS
  Filled 2014-01-28: qty 2

## 2014-01-28 MED ORDER — VITAMIN D3 25 MCG (1000 UNIT) PO TABS
2000.0000 [IU] | ORAL_TABLET | Freq: Every day | ORAL | Status: DC
  Administered 2014-01-28 – 2014-01-31 (×4): 2000 [IU] via ORAL
  Filled 2014-01-28 (×4): qty 2

## 2014-01-28 MED ORDER — ZOLPIDEM TARTRATE 5 MG PO TABS
5.0000 mg | ORAL_TABLET | Freq: Every evening | ORAL | Status: DC | PRN
  Administered 2014-01-29 – 2014-01-30 (×2): 5 mg via ORAL
  Filled 2014-01-28 (×2): qty 1

## 2014-01-28 MED ORDER — HYDROCODONE-ACETAMINOPHEN 5-325 MG PO TABS
1.0000 | ORAL_TABLET | ORAL | Status: DC | PRN
  Administered 2014-01-28 – 2014-01-30 (×5): 2 via ORAL
  Filled 2014-01-28 (×5): qty 2

## 2014-01-28 MED ORDER — HYDROMORPHONE HCL PF 1 MG/ML IJ SOLN
1.0000 mg | INTRAMUSCULAR | Status: DC | PRN
  Administered 2014-01-28 – 2014-01-30 (×4): 1 mg via INTRAVENOUS
  Filled 2014-01-28 (×3): qty 1

## 2014-01-28 MED ORDER — HYDROMORPHONE HCL PF 1 MG/ML IJ SOLN
INTRAMUSCULAR | Status: AC
  Filled 2014-01-28: qty 1

## 2014-01-28 MED ORDER — VANCOMYCIN HCL IN DEXTROSE 1-5 GM/200ML-% IV SOLN
1000.0000 mg | Freq: Once | INTRAVENOUS | Status: AC
  Administered 2014-01-28: 1000 mg via INTRAVENOUS
  Filled 2014-01-28: qty 200

## 2014-01-28 MED ORDER — INSULIN ASPART 100 UNIT/ML ~~LOC~~ SOLN
0.0000 [IU] | Freq: Three times a day (TID) | SUBCUTANEOUS | Status: DC
  Administered 2014-01-29: 11 [IU] via SUBCUTANEOUS
  Administered 2014-01-29: 8 [IU] via SUBCUTANEOUS

## 2014-01-28 MED ORDER — ONDANSETRON HCL 4 MG/2ML IJ SOLN
4.0000 mg | Freq: Four times a day (QID) | INTRAMUSCULAR | Status: DC | PRN
Start: 2014-01-28 — End: 2014-01-31

## 2014-01-28 MED ORDER — VITAMIN B-12 1000 MCG PO TABS
1000.0000 ug | ORAL_TABLET | Freq: Every day | ORAL | Status: DC
  Administered 2014-01-28 – 2014-01-31 (×4): 1000 ug via ORAL
  Filled 2014-01-28 (×4): qty 1

## 2014-01-28 MED ORDER — HYDROMORPHONE HCL PF 1 MG/ML IJ SOLN
1.0000 mg | Freq: Once | INTRAMUSCULAR | Status: AC
  Administered 2014-01-28: 1 mg via INTRAVENOUS
  Filled 2014-01-28: qty 1

## 2014-01-28 MED ORDER — INSULIN ASPART 100 UNIT/ML ~~LOC~~ SOLN
0.0000 [IU] | Freq: Every day | SUBCUTANEOUS | Status: DC
  Administered 2014-01-28: 5 [IU] via SUBCUTANEOUS

## 2014-01-28 MED ORDER — LAMOTRIGINE 25 MG PO TABS
25.0000 mg | ORAL_TABLET | Freq: Two times a day (BID) | ORAL | Status: DC
  Administered 2014-01-28 – 2014-01-31 (×6): 25 mg via ORAL
  Filled 2014-01-28 (×7): qty 1

## 2014-01-28 MED ORDER — ACETAMINOPHEN 650 MG RE SUPP: 650.0000 mg | Freq: Four times a day (QID) | RECTAL | Status: DC | PRN

## 2014-01-28 MED ORDER — GABAPENTIN 800 MG PO TABS
800.0000 mg | ORAL_TABLET | Freq: Three times a day (TID) | ORAL | Status: DC
  Filled 2014-01-28: qty 1

## 2014-01-28 MED ORDER — MORPHINE SULFATE 4 MG/ML IJ SOLN
4.0000 mg | Freq: Once | INTRAMUSCULAR | Status: AC
  Administered 2014-01-28: 4 mg via INTRAVENOUS
  Filled 2014-01-28: qty 1

## 2014-01-28 MED ORDER — ONDANSETRON HCL 4 MG/2ML IJ SOLN
4.0000 mg | Freq: Once | INTRAMUSCULAR | Status: AC
Start: 2014-01-28 — End: 2014-01-28
  Administered 2014-01-28: 4 mg via INTRAVENOUS
  Filled 2014-01-28: qty 2

## 2014-01-28 MED ORDER — LEVOTHYROXINE SODIUM 75 MCG PO TABS
75.0000 ug | ORAL_TABLET | Freq: Every day | ORAL | Status: DC
  Administered 2014-01-28 – 2014-01-31 (×4): 75 ug via ORAL
  Filled 2014-01-28 (×4): qty 1

## 2014-01-28 MED ORDER — CLONAZEPAM 0.5 MG PO TABS
0.5000 mg | ORAL_TABLET | Freq: Two times a day (BID) | ORAL | Status: DC | PRN
  Administered 2014-01-30: 0.5 mg via ORAL
  Filled 2014-01-28: qty 1

## 2014-01-28 MED ORDER — HEPARIN SODIUM (PORCINE) 5000 UNIT/ML IJ SOLN
5000.0000 [IU] | Freq: Three times a day (TID) | INTRAMUSCULAR | Status: DC
  Administered 2014-01-28 – 2014-01-31 (×8): 5000 [IU] via SUBCUTANEOUS
  Filled 2014-01-28 (×11): qty 1

## 2014-01-28 MED ORDER — GABAPENTIN 400 MG PO CAPS
800.0000 mg | ORAL_CAPSULE | Freq: Three times a day (TID) | ORAL | Status: DC
  Administered 2014-01-28 – 2014-01-30 (×5): 800 mg via ORAL
  Filled 2014-01-28 (×7): qty 2

## 2014-01-28 NOTE — ED Notes (Signed)
Pt reports having a rash to bilateral lower legs x 1 week, has been to pcp and told it was cellulitis and started on a cream and antibiotics but reports the rash, swelling and pain has increased.

## 2014-01-28 NOTE — Progress Notes (Signed)
Katherine Herrera NOTE  Pharmacy Consult to verify insulin outpatient regimen  48 yo F with history of DM presents to the ED with rash on legs that has worsened for past 2-3 weeks.  Pharmacy consulted to verify PTA insulin.  I spoke with patient's outpatient pharmacy ([facility]), and they have on record 2 orders:  Insulin NPH: 30 units qHS (but patient takes 35 units, see below) Insulin Regular: 35 units with breakfast, 20 units with lunch, and 45 units with dinner  After then speaking with patient personally, she reports actually taking 35 units of her NPH every night at bedtime.  She takes the regular insulin as ordered above.  Katherine Herrera, PharmD Clinical Pharmacist - Resident Pager: [phone] Pharmacy: [phone] 01/28/2014 6:20 PM

## 2014-01-28 NOTE — H&P (Signed)
History and Physical       Hospital Admission Note Date: 01/28/2014  Patient name: Katherine Herrera Medical record number: [id number] Date of birth: Oct 22, 1966 Age: 48 y.o. Gender: female  PCP: Walker Kehr, MD    Chief Complaint:  Rash on the legs, worsening for the last 2-3 weeks  HPI: Patient is a 48 year old female with history of hypertension, dyslipidemia, diabetes mellitus, diabetic neuropathy presented with petechial rash that started on both ankles about 3 weeks ago. Patient reports no fevers, congestion, cough or any upper respiratory symptoms or GI symptoms. The patient went to her PCP and was prescribed doxycycline for possible cellulitis however with no improvement. Patient denies any change in her medications or any recent new medications. Other than occasional night sweats, she did denies any loss of weight, joint pains, fevers or headaches or visual issues at this time. Patient reports that she had 1 episode of fever one week ago but it resolved.   Review of Systems:  Constitutional: Denies fever, chills, diaphoresis, poor appetite and fatigue.  HEENT: Denies photophobia, eye pain, redness, hearing loss, ear pain, congestion, sore throat, rhinorrhea, sneezing, mouth sores, trouble swallowing, neck pain, neck stiffness and tinnitus.   Respiratory: Denies SOB, DOE, cough, chest tightness,  and wheezing.   Cardiovascular: Denies chest pain, palpitations and leg swelling.  Gastrointestinal: Denies nausea, vomiting, abdominal pain, diarrhea, constipation, blood in stool and abdominal distention.  Genitourinary: Denies dysuria, urgency, frequency, hematuria, flank pain and difficulty urinating.  Musculoskeletal: Denies myalgias, back pain, joint swelling, arthralgias and gait problem.  Skin: Please see history of present illness  Neurological: Denies dizziness, seizures, syncope, weakness, light-headedness, numbness and  headaches.  Hematological: Denies adenopathy. Easy bruising, personal or family bleeding history  Psychiatric/Behavioral: Denies suicidal ideation, mood changes, confusion, nervousness, sleep disturbance and agitation  Past Medical History: Past Medical History  Diagnosis Date  . Syncope     poss conversion disorder vs psychogenic seizures  . DM (diabetes mellitus)   . Hypothyroidism   . LBP (low back pain)     DR Patrice Paradise  . Peripheral neuropathy   . COPD (chronic obstructive pulmonary disease)   . Vitamin D deficiency   . Migraine   . Hyperlipidemia   . Anxiety   . Gout     possible  . Tachycardia     diet coke overuse?  . TTS (tarsal tunnel syndrome) 2010    Left Foot [facility] in [place]   Past Surgical History  Procedure Laterality Date  . Cervical fusion      C4-5  . Abdominal hysterectomy    . Carpal tunnel release  2010    LEFT    Medications: Prior to Admission medications   Medication Sig Start Date End Date Taking? Authorizing Provider  baclofen (LIORESAL) 10 MG tablet Take 2 tablets (20 mg total) by mouth 2 (two) times daily. 01/23/13  Yes [name] Plotnikov, MD  Cholecalciferol (VITAMIN D3) 1000 UNIT tablet Take 2,000 Units by mouth daily.     Yes Historical Provider, MD  clonazePAM (KLONOPIN) 0.5 MG tablet TAKE ONE TABLET BY MOUTH 2 TIMES A DAY AS NEEDED FOR ANXIETY 09/10/13  Yes [name] Plotnikov, MD  doxycycline (VIBRA-TABS) 100 MG tablet Take 100 mg by mouth 2 (two) times daily. 01/21/14  Yes [name] Plotnikov, MD  estradiol (ESTRACE) 1 MG tablet Take 1 mg by mouth daily.     Yes Historical Provider, MD  gabapentin (NEURONTIN) 800 MG tablet Take 1 tablet (800 mg total)  by mouth 3 (three) times daily. 12/03/13  Yes [name] Plotnikov, MD  HYDROcodone-acetaminophen (NORCO) 10-325 MG per tablet TAKE ONE TABLET BY MOUTH EVERY 6 HOURS AS NEEDED FOR PAIN  Please fill on or after 03/09/14 01/01/14  Yes [name] Plotnikov, MD  insulin NPH Human (HUMULIN N,NOVOLIN N) 100  UNIT/ML injection Inject 35 Units into the skin at bedtime. 07/10/13  Yes Renato Shin, MD  insulin regular (NOVOLIN R,HUMULIN R) 100 units/mL injection Inject 35-45 Units into the skin 3 (three) times daily before meals. 35 units every morning, 20 units at lunch and 45 units in the evening 07/10/13  Yes Renato Shin, MD  lamoTRIgine (LAMICTAL) 25 MG tablet Take 1 tablet (25 mg total) by mouth 2 (two) times daily. 12/11/13  Yes [name] Plotnikov, MD  levothyroxine (SYNTHROID, LEVOTHROID) 75 MCG tablet Take 1 tablet (75 mcg total) by mouth daily. 11/05/13  Yes Cassandria Anger, MD  PRESCRIPTION MEDICATION Apply 1-2 g topically See admin instructions. Apply 1-2 grams (1-2 ML) to affected area 2 to 3 times a day Diclofenac/Baclofen/Bupivacane   Yes Historical Provider, MD  rosuvastatin (CRESTOR) 10 MG tablet Take 10 mg by mouth See admin instructions. Take 1 tablet every 3 days   Yes Historical Provider, MD  traMADol (ULTRAM) 50 MG tablet Take 1-2 tablets (50-100 mg total) by mouth 2 (two) times daily as needed (headache). 01/30/13  Yes [name] Plotnikov, MD  triamcinolone ointment (KENALOG) 0.5 % Apply 1 application topically 2 (two) times daily. 01/21/14  Yes [name] Plotnikov, MD  vitamin B-12 (CYANOCOBALAMIN) 1000 MCG tablet Take 1,000 mcg by mouth daily.     Yes Historical Provider, MD  zolpidem (AMBIEN) 10 MG tablet Take 0.5-1 tablets (5-10 mg total) by mouth at bedtime as needed. For insomnia 12/07/12  Yes Cassandria Anger, MD  ACCU-CHEK SOFTCLIX LANCETS lancets  01/23/13   Historical Provider, MD  B-D INS SYRINGE 0.5CC/31GX5/16 31G X 5/16" 0.5 ML MISC  08/15/13   Historical Provider, MD  BD PEN NEEDLE NANO U/F 32G X 4 MM MISC  03/17/13   Historical Provider, MD  Blood Glucose Monitoring Suppl (ACCU-CHEK AVIVA PLUS) W/DEVICE KIT USE AS DIRECTED 05/09/13   Renato Shin, MD  glucose blood (ACCU-CHEK AVIVA PLUS) test strip As directed 10/09/13   Cassandria Anger, MD  Lancet Devices (EASY TOUCH  LANCING DEVICE) MISC  01/14/14   Historical Provider, MD    Allergies:   Allergies  Allergen Reactions  . Crestor [Rosuvastatin Calcium]     myalgia  . Lovastatin Other (See Comments)    Unknown   . Metformin Nausea And Vomiting  . Niacin Other (See Comments)    Unknown   . Paroxetine Other (See Comments)    Unknown   . Meperidine Hcl Rash    Social History:  reports that she quit smoking about 4 years ago. She has never used smokeless tobacco. She reports that she does not drink alcohol or use illicit drugs.  Family History: Family History  Problem Relation Age of Onset  . Diabetes Mother   . Hypertension Mother   . Cancer Father 73    ? bone cancer    Physical Exam: Blood pressure 114/59, pulse 90, temperature 98.1 F (36.7 C), temperature source Oral, resp. rate 14, height '5\' 9"'  (1.753 m), weight 121.836 kg (268 lb 9.6 oz), SpO2 90.00%. General: Alert, awake, oriented x3, in no acute distress. HEENT: normocephalic, atraumatic, anicteric sclera, pink conjunctiva, pupils equal and reactive to light  and accomodation, oropharynx clear Neck: supple, no masses or lymphadenopathy, no goiter, no bruits  Heart: Regular rate and rhythm, without murmurs, rubs or gallops. Lungs: Clear to auscultation bilaterally, no wheezing, rales or rhonchi. Abdomen: Soft, nontender, nondistended, positive bowel sounds, no masses. Extremities: No clubbing, cyanosis or edema with positive pedal pulses. Neuro: Grossly intact, no focal neurological deficits, strength 5/5 upper and lower extremities bilaterally Psych: alert and oriented x 3, normal mood and affect Skin: Petechial rash more concentrated on both ankles with some superimposed cellulitis, spreading upward to the thighs and lower abdomen, nontender, non-blanchable   LABS on Admission:  Basic Metabolic Panel:  Recent Labs Lab 01/28/14 1346  NA 139  K 4.6  CL 99  CO2 28  GLUCOSE 115*  BUN 10  CREATININE 0.72  CALCIUM 9.7    Liver Function Tests:  Recent Labs Lab 01/28/14 1346  AST 17  ALT 11  ALKPHOS 126*  BILITOT 0.4  PROT 8.0  ALBUMIN 3.2*   No results found for this basename: LIPASE, AMYLASE,  in the last 168 hours No results found for this basename: AMMONIA,  in the last 168 hours CBC:  Recent Labs Lab 01/28/14 1346  WBC 9.7  NEUTROABS 6.4  HGB 14.6  HCT 43.0  MCV 89.6  PLT 342   Cardiac Enzymes: No results found for this basename: CKTOTAL, CKMB, CKMBINDEX, TROPONINI,  in the last 168 hours BNP: No components found with this basename: POCBNP,  CBG: No results found for this basename: GLUCAP,  in the last 168 hours   Radiological Exams on Admission: No results found.  Assessment/Plan Principal Problem:   Rash: Appears to be vasculitis, petechial, non-tender and non-blanchable, may have slight superimposed cellulitis - Will obtain ESR, CRP, complement levels, ANCA screen, cryoglobulin - Will continue IV vancomycin for now for superimposed cellulitis at the ankles, 1 dose of IV Solu-Medrol - Patient needs skin biopsy for the current diagnosis however unfortunately dermatology service is not available. I even contacted patient's dermatologist, Dr. Onalee Hua office, who she was going to see in April for this issue, unfortunately Dr. Denna Haggard is not available for consult. I have consulted gen surgery for the skin biopsy and infectious disease, discussed with Dr. Linus Salmons.  Active Problems:   Unspecified hypothyroidism - Continue Synthroid     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 - Continue insulin, per patient's home schedule and correction factor sliding scale   DVT prophylaxis:  Akin subcutaneous   CODE STATUS:   Family Communication: Admission, patients condition and plan of care including tests being ordered have been discussed with the patient and husband who indicates understanding and agree with the plan and Code Status   Further plan will depend as patient's  clinical course evolves and further radiologic and laboratory data become available.   Time Spent on Admission: 1 hour  Marzella Miracle M.D. [facility] 01/28/2014, 4:18 PM Pager: [phone]  If 7PM-7AM, please contact night-coverage [email or web address] Password [email or web address]

## 2014-01-28 NOTE — ED Notes (Signed)
Phlebotomy notified of need for lab draws.

## 2014-01-28 NOTE — ED Notes (Signed)
Contacted bed control regarding delay. Pt updated

## 2014-01-28 NOTE — ED Provider Notes (Signed)
CSN: [id number]     Arrival date & time 01/28/14  1154 History   First MD Initiated Contact with Patient 01/28/14 1224     Chief Complaint  Patient presents with  . Leg Pain     (Consider location/radiation/quality/duration/timing/severity/associated sxs/prior Treatment) HPI Comments: Patient is a 48 year old female with history of diabetes, hypothyroidism, peripheral neuropathy, COPD, vitamin D deficiency, hyperlipidemia, gout, tachycardia who presents today with a rash to bilateral lower legs. She reports that the rash has been gradually worsening for the past 3 weeks, but suddenly worsened in the past week. She was seen by her primary care physician who prescribed her doxycycline. She has been taking this as prescribed for the past week without relief of her symptoms. She has a burning pain associated with this rash. She's never had a rash like this in the past. She was febrile one week ago, but this has resolved. She denies any new symptoms including chest pain, shortness of breath, nausea, vomiting, abdominal pain. Walking does not worsen her pain. She is scheduled to see a dermatologist in April.  The history is provided by the patient. No language interpreter was used.    Past Medical History  Diagnosis Date  . Syncope     poss conversion disorder vs psychogenic seizures  . DM (diabetes mellitus)   . Hypothyroidism   . LBP (low back pain)     DR Patrice Paradise  . Peripheral neuropathy   . COPD (chronic obstructive pulmonary disease)   . Vitamin D deficiency   . Migraine   . Hyperlipidemia   . Anxiety   . Gout     possible  . Tachycardia     diet coke overuse?  . TTS (tarsal tunnel syndrome) 2010    Left Foot [facility] in [place]   Past Surgical History  Procedure Laterality Date  . Cervical fusion      C4-5  . Abdominal hysterectomy    . Carpal tunnel release  2010    LEFT   Family History  Problem Relation Age of Onset  . Diabetes Mother   . Hypertension Mother   . Cancer  Father 68    ? bone cancer   History  Substance Use Topics  . Smoking status: Former Smoker    Quit date: 12/01/2009  . Smokeless tobacco: Never Used  . Alcohol Use: No   OB History   Grav Para Term Preterm Abortions TAB SAB Ect Mult Living                 Review of Systems  Constitutional: Positive for fever.  Respiratory: Negative for shortness of breath.   Cardiovascular: Negative for chest pain.  Gastrointestinal: Negative for nausea, vomiting and abdominal pain.  Musculoskeletal: Positive for joint swelling and myalgias.  All other systems reviewed and are negative.      Allergies  Crestor; Lovastatin; Metformin; Niacin; Paroxetine; and Meperidine hcl  Home Medications   Current Outpatient Rx  Name  Route  Sig  Dispense  Refill  . baclofen (LIORESAL) 10 MG tablet   Oral   Take 2 tablets (20 mg total) by mouth 2 (two) times daily.   360 each   1   . Cholecalciferol (VITAMIN D3) 1000 UNIT tablet   Oral   Take 2,000 Units by mouth daily.           . clonazePAM (KLONOPIN) 0.5 MG tablet      TAKE ONE TABLET BY MOUTH 2 TIMES A DAY AS NEEDED FOR  ANXIETY         . doxycycline (VIBRA-TABS) 100 MG tablet   Oral   Take 100 mg by mouth 2 (two) times daily.         Marland Kitchen estradiol (ESTRACE) 1 MG tablet   Oral   Take 1 mg by mouth daily.           Marland Kitchen gabapentin (NEURONTIN) 800 MG tablet   Oral   Take 1 tablet (800 mg total) by mouth 3 (three) times daily.   270 tablet   1   . HYDROcodone-acetaminophen (NORCO) 10-325 MG per tablet      TAKE ONE TABLET BY MOUTH EVERY 6 HOURS AS NEEDED FOR PAIN  Please fill on or after 03/09/14   120 tablet   0   . insulin NPH Human (HUMULIN N,NOVOLIN N) 100 UNIT/ML injection   Subcutaneous   Inject 35 Units into the skin at bedtime.         . insulin regular (NOVOLIN R,HUMULIN R) 100 units/mL injection   Subcutaneous   Inject 35-45 Units into the skin 3 (three) times daily before meals. 35 units every morning, 20  units at lunch and 45 units in the evening         . lamoTRIgine (LAMICTAL) 25 MG tablet   Oral   Take 1 tablet (25 mg total) by mouth 2 (two) times daily.   180 tablet   1   . levothyroxine (SYNTHROID, LEVOTHROID) 75 MCG tablet   Oral   Take 1 tablet (75 mcg total) by mouth daily.   90 tablet   3   . PRESCRIPTION MEDICATION   Topical   Apply 1-2 g topically See admin instructions. Apply 1-2 grams (1-2 ML) to affected area 2 to 3 times a day Diclofenac/Baclofen/Bupivacane         . rosuvastatin (CRESTOR) 10 MG tablet   Oral   Take 10 mg by mouth See admin instructions. Take 1 tablet every 3 days         . traMADol (ULTRAM) 50 MG tablet   Oral   Take 1-2 tablets (50-100 mg total) by mouth 2 (two) times daily as needed (headache).   180 tablet   0   . triamcinolone ointment (KENALOG) 0.5 %   Topical   Apply 1 application topically 2 (two) times daily.   90 g   1   . vitamin B-12 (CYANOCOBALAMIN) 1000 MCG tablet   Oral   Take 1,000 mcg by mouth daily.           Marland Kitchen zolpidem (AMBIEN) 10 MG tablet   Oral   Take 0.5-1 tablets (5-10 mg total) by mouth at bedtime as needed. For insomnia   90 tablet   1   . ACCU-CHEK SOFTCLIX LANCETS lancets               . B-D INS SYRINGE 0.5CC/31GX5/16 31G X 5/16" 0.5 ML MISC               . BD PEN NEEDLE NANO U/F 32G X 4 MM MISC               . Blood Glucose Monitoring Suppl (ACCU-CHEK AVIVA PLUS) W/DEVICE KIT      USE AS DIRECTED   1 kit   0   . glucose blood (ACCU-CHEK AVIVA PLUS) test strip      As directed   100 each   11   . Lancet Devices (EASY TOUCH LANCING  DEVICE) MISC                BP 122/69  Pulse 87  Temp(Src) 97.7 F (36.5 C) (Oral)  Resp 18  Ht _0  (1.753 m)  Wt 268 lb 9.6 oz (121.836 kg)  BMI 39.65 kg/m2  SpO2 96% Physical Exam  Nursing note and vitals reviewed. Constitutional: She is oriented to person, place, and time. She appears well-developed and well-nourished. No  distress.  HENT:  Head: Normocephalic and atraumatic.  Right Ear: External ear normal.  Left Ear: External ear normal.  Nose: Nose normal.  Mouth/Throat: Oropharynx is clear and moist.  Eyes: Conjunctivae are normal.  Neck: Normal range of motion.  Cardiovascular: Normal rate, regular rhythm and normal heart sounds.   Pulmonary/Chest: Effort normal and breath sounds normal. No stridor. No respiratory distress. She has no wheezes. She has no rales.  Abdominal: Soft. She exhibits no distension.  Musculoskeletal: Normal range of motion.  Neurological: She is alert and oriented to person, place, and time. She has normal strength.  Skin: Skin is warm and dry. Rash noted. She is not diaphoretic. No erythema.  Petechial rash on lower extremities bilaterally. Left worse than right. Worse distally. Superimposed cellulitis with erythema and warmth.   Psychiatric: She has a normal mood and affect. Her behavior is normal.    ED Course  Procedures (including critical care time) Labs Review Labs Reviewed  COMPREHENSIVE METABOLIC PANEL - Abnormal; Notable for the following:    Glucose, Bld 115 (*)    Albumin 3.2 (*)    Alkaline Phosphatase 126 (*)    All other components within normal limits  SEDIMENTATION RATE - Abnormal; Notable for the following:    Sed Rate 50 (*)    All other components within normal limits  CULTURE, BLOOD (ROUTINE X 2)  CULTURE, BLOOD (ROUTINE X 2)  CBC WITH DIFFERENTIAL  PROTIME-INR  APTT  C-REACTIVE PROTEIN  C4 COMPLEMENT  C3 COMPLEMENT  CRYOGLOBULIN  ANCA SCREEN W REFLEX TITER  MPO/PR-3 (ANCA) ANTIBODIES  I-STAT CG4 LACTIC ACID, ED   Imaging Review No results found.   EKG Interpretation None      MDM   Final diagnoses:  Rash  Diabetes  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   Patient presents to ED with petechial rash consistent with possible vasculitis. There is a superimposed cellulitis. Patient has failed outpatient therapy with  doxycycline. Patient is not septic. Vancomycin started in ED. Patient admitted to medicine for further evaluation. Vital signs stable. Pain is controlled. Dr. Zenia Resides evaluated patient and agrees with plan. Patient / Family / Caregiver informed of clinical course, understand medical decision-making process, and agree with plan.     Elwyn Lade, PA-C 01/28/14 [phone]

## 2014-01-28 NOTE — ED Notes (Signed)
Dr Allen at bedside  

## 2014-01-28 NOTE — Progress Notes (Signed)
ANTIBIOTIC CONSULT NOTE - INITIAL  Pharmacy Consult for Vancomycin Indication: cellulitis  Allergies  Allergen Reactions  . Crestor [Rosuvastatin Calcium]     myalgia  . Lovastatin Other (See Comments)    Unknown   . Metformin Nausea And Vomiting  . Niacin Other (See Comments)    Unknown   . Paroxetine Other (See Comments)    Unknown   . Meperidine Hcl Rash    Patient Measurements: Height: 5\' 9"  (175.3 cm) Weight: 260 lb 6.4 oz (118.117 kg) IBW/kg (Calculated) : 66.2 Adjusted Body Weight:   Vital Signs: Temp: 98.1 F (36.7 C) (03/31 2134) Temp src: Oral (03/31 2134) BP: 121/76 mmHg (03/31 2134) Pulse Rate: 86 (03/31 2134) Intake/Output from previous day:   Intake/Output from this shift:    Labs:  Recent Labs  01/28/14 1346  WBC 9.7  HGB 14.6  PLT 342  CREATININE 0.72   Estimated Creatinine Clearance: 118.1 ml/min (by C-G formula based on Cr of 0.72). No results found for this basename: VANCOTROUGH, VANCOPEAK, VANCORANDOM, GENTTROUGH, GENTPEAK, GENTRANDOM, TOBRATROUGH, TOBRAPEAK, TOBRARND, AMIKACINPEAK, AMIKACINTROU, AMIKACIN,  in the last 72 hours   Microbiology: No results found for this or any previous visit (from the past 720 hour(s)).  Medical History: Past Medical History  Diagnosis Date  . Syncope     poss conversion disorder vs psychogenic seizures  . DM (diabetes mellitus)   . Hypothyroidism   . LBP (low back pain)     DR Patrice Paradise  . Peripheral neuropathy   . COPD (chronic obstructive pulmonary disease)   . Vitamin D deficiency   . Migraine   . Hyperlipidemia   . Anxiety   . Gout     possible  . Tachycardia     diet coke overuse?  . TTS (tarsal tunnel syndrome) 2010    Left Foot [facility] in [place]    Medications:  Scheduled:  . baclofen  20 mg Oral BID  . cholecalciferol  2,000 Units Oral Daily  . estradiol  1 mg Oral Daily  . gabapentin  800 mg Oral TID  . heparin  5,000 Units Subcutaneous 3 times per day  . [START ON 01/29/2014] insulin  aspart  0-15 Units Subcutaneous TID WC  . insulin aspart  0-5 Units Subcutaneous QHS  . insulin NPH Human  35 Units Subcutaneous QHS  . lamoTRIgine  25 mg Oral BID  . levothyroxine  75 mcg Oral Daily  . [START ON 01/29/2014] vancomycin  1,000 mg Intravenous Q8H  . vitamin B-12  1,000 mcg Oral Daily   Assessment: 48 yr old female with history of HTN, DM, dyslipidemia, diabetic neuropathy presented with cellulitis on both ankles that had been treated on an outpatient basis with Doxycycline but continued to get worse. The MD has ordered Vancomycin per pharmacy. She received one Gm of Vancomycin in the ED.  Goal of Therapy:  Vancomycin trough level 10-15 mcg/ml  Plan:  Will start with Vancomycin 1 Gm IV q8h and check a vanc trough when appropriate.   Minta Balsam 01/28/2014,9:54 PM

## 2014-01-28 NOTE — ED Notes (Signed)
Holding Vanc until blood cultures drawn per [place] PA

## 2014-01-28 NOTE — ED Notes (Signed)
Attempted report 

## 2014-01-29 ENCOUNTER — Encounter (HOSPITAL_COMMUNITY): Payer: Self-pay | Admitting: General Surgery

## 2014-01-29 DIAGNOSIS — J449 Chronic obstructive pulmonary disease, unspecified: Secondary | ICD-10-CM

## 2014-01-29 DIAGNOSIS — R109 Unspecified abdominal pain: Secondary | ICD-10-CM

## 2014-01-29 DIAGNOSIS — R21 Rash and other nonspecific skin eruption: Secondary | ICD-10-CM

## 2014-01-29 DIAGNOSIS — M310 Hypersensitivity angiitis: Secondary | ICD-10-CM

## 2014-01-29 DIAGNOSIS — J019 Acute sinusitis, unspecified: Secondary | ICD-10-CM

## 2014-01-29 DIAGNOSIS — F411 Generalized anxiety disorder: Secondary | ICD-10-CM

## 2014-01-29 LAB — GLUCOSE, CAPILLARY
Glucose-Capillary: 298 mg/dL — ABNORMAL HIGH (ref 70–99)
Glucose-Capillary: 346 mg/dL — ABNORMAL HIGH (ref 70–99)
Glucose-Capillary: 423 mg/dL — ABNORMAL HIGH (ref 70–99)
Glucose-Capillary: 284 mg/dL — ABNORMAL HIGH (ref 70–99)

## 2014-01-29 LAB — CBC
HCT: 40.5 % (ref 36.0–46.0)
Hemoglobin: 13.7 g/dL (ref 12.0–15.0)
MCH: 30.4 pg (ref 26.0–34.0)
MCHC: 33.8 g/dL (ref 30.0–36.0)
MCV: 89.8 fL (ref 78.0–100.0)
Platelets: 402 10*3/uL — ABNORMAL HIGH (ref 150–400)
RBC: 4.51 MIL/uL (ref 3.87–5.11)
RDW: 13.3 % (ref 11.5–15.5)
WBC: 16.6 10*3/uL — ABNORMAL HIGH (ref 4.0–10.5)

## 2014-01-29 LAB — BASIC METABOLIC PANEL
BUN: 18 mg/dL (ref 6–23)
CO2: 25 mEq/L (ref 19–32)
Calcium: 9.2 mg/dL (ref 8.4–10.5)
Chloride: 93 mEq/L — ABNORMAL LOW (ref 96–112)
Creatinine, Ser: 0.72 mg/dL (ref 0.50–1.10)
GFR calc Af Amer: >90 mL/min (ref >90)
GFR calc non Af Amer: >90 mL/min (ref >90)
Glucose, Bld: 317 mg/dL — ABNORMAL HIGH (ref 70–99)
Potassium: 5.1 mEq/L (ref 3.7–5.3)
Sodium: 134 mEq/L — ABNORMAL LOW (ref 137–147)

## 2014-01-29 LAB — HEMOGLOBIN A1C
Hgb A1c MFr Bld: 7.8 % — ABNORMAL HIGH (ref <5.7)
Mean Plasma Glucose: 177 mg/dL — ABNORMAL HIGH (ref <117)

## 2014-01-29 LAB — ANCA SCREEN W REFLEX TITER
Atypical p-ANCA Screen: NEGATIVE
c-ANCA Screen: NEGATIVE
p-ANCA Screen: NEGATIVE

## 2014-01-29 LAB — MPO/PR-3 (ANCA) ANTIBODIES
Myeloperoxidase Abs: <1
Serine Protease 3: <1

## 2014-01-29 LAB — TSH: TSH: 0.546 u[IU]/mL (ref 0.350–4.500)

## 2014-01-29 MED ORDER — INSULIN ASPART 100 UNIT/ML ~~LOC~~ SOLN
20.0000 [IU] | Freq: Once | SUBCUTANEOUS | Status: AC
  Administered 2014-01-29: 20 [IU] via SUBCUTANEOUS

## 2014-01-29 MED ORDER — LIDOCAINE HCL (PF) 1 % IJ SOLN
INTRAMUSCULAR | Status: AC
  Filled 2014-01-29: qty 5

## 2014-01-29 MED ORDER — LIDOCAINE HCL (PF) 1 % IJ SOLN: 5.0000 mL | Freq: Once | INTRAMUSCULAR | Status: DC

## 2014-01-29 MED ORDER — INSULIN GLARGINE 100 UNIT/ML ~~LOC~~ SOLN
45.0000 [IU] | Freq: Every day | SUBCUTANEOUS | Status: DC
  Administered 2014-01-29: 45 [IU] via SUBCUTANEOUS
  Filled 2014-01-29 (×2): qty 0.45

## 2014-01-29 NOTE — Procedures (Signed)
Incision and Drainage Procedure Note  Pre-operative Diagnosis: rash  Post-operative Diagnosis: same  Indications: this is a 48 yo female who has had a rash on her bilateral lower extremities for the last 3 weeks.  She has been treated with a cream (suspect steroid) and abx to no avail.  She was admitted for further work up.  We have been asked to complete skin biopsy.  Anesthesia: 1% plain lidocaine  Procedure Details  The procedure, risks and complications have been discussed in detail (including, but not limited to infection, bleeding) with the patient, and the patient has gave verbal consent to the procedure.  The skin was sterilely prepped and draped over the affected area in the usual fashion. After adequate local anesthesia to her left medial calf 2 45mm punch biopsies were used to obtain 2 separate skin biopsies.  Healthy tissue and rash obtained in each specimen.  Hemostasis was obtained.  Wounds dressed. The patient was observed until stable.  Findings: Rash noted  EBL: 2 cc's  Drains: none  Condition: Tolerated procedure well and Stable   Complications: none.  Herbie Lehrmann E 01/29/2014 10:22 AM

## 2014-01-29 NOTE — Progress Notes (Signed)
Utilization review completed.  

## 2014-01-29 NOTE — Progress Notes (Signed)
Pt CBG 423 MD made aware, one time order given for 20 units insulin.

## 2014-01-29 NOTE — Progress Notes (Addendum)
Inpatient Diabetes Program Recommendations  AACE/ADA: New Consensus Statement on Inpatient Glycemic Control (2013)  Target Ranges:  Prepandial:   less than 140 mg/dL      Peak postprandial:   less than 180 mg/dL (1-2 hours)      Critically ill patients:  140 - 180 mg/dL    Results for Katherine Herrera, Katherine Herrera (MRN [id number]) as of 01/29/2014 08:41  Ref. Range 01/28/2014 21:31 01/29/2014 07:51  Glucose-Capillary Latest Range: 70-99 mg/dL 396 (H) 298 (H)    Results for Katherine Herrera, Katherine Herrera (MRN [id number]) as of 01/29/2014 08:41  Ref. Range 01/28/2014 13:46  Hemoglobin A1C Latest Range: <5.7 % 7.8 (H)     Reason for Assessment: Admitted with Rash/Cellulitis on LE.  History of DM2, HTN  Diabetes history: Type 2 DM Home DM Meds: NPH insulin 35 units QHS + Regular insulin 35 units with breakfast/ 20 units with lunch/ 45 units with supper PCP: Walker Kehr, MD    **Patient was given one dose of IV Solumedrol (125 mg) yesterday at 4pm.  A1C shows decent control at home prior to admission.  Patient was started on home dose of NPH (35 units) last night as well as Novolog Moderate SSI tid ac + HS.  **Note patient's CBG this AM was elevated to 298 mg/dl likely due to the IV Solumedrol patient received yesterday.   MD-If patient's PO intake is decent, patient may need a portion of her home meal coverage added back to regimen.  Takes 35 units with breakfast/ 20 units with lunch/ 45 units with supper at home.   Will follow. Wyn Quaker RN, MSN, CDE Diabetes Coordinator Inpatient Diabetes Program Team Pager: [phone] (8a-10p)

## 2014-01-29 NOTE — Care Management Note (Unsigned)
    Page 1 of 1   01/30/2014     3:57:38 PM   CARE MANAGEMENT NOTE 01/30/2014  Patient:  SATIN, BOAL   Account Number:  [id number]  Date Initiated:  01/29/2014  Documentation initiated by:  Tomi Bamberger  Subjective/Objective Assessment:   dx cellulitis vs vasculitis  admit- lives with spouse.     Action/Plan:   Anticipated DC Date:  01/31/2014   Anticipated DC Plan:  [place]  CM consult      Choice offered to / List presented to:             Status of service:  In process, will continue to follow Medicare Important Message given?   (If response is "NO", the following Medicare IM given date fields will be blank) Date Medicare IM given:   Date Additional Medicare IM given:    Discharge Disposition:    Per UR Regulation:    If discussed at Long Length of Stay Meetings, dates discussed:    Comments:  01/30/14 [place], BSN [phone] NCM ran benefit check on lantus and novolog insulin vial, for a thirty day supply co pay is $45, but for mail order is $125.  NCM informed MD of this information.

## 2014-01-29 NOTE — Progress Notes (Signed)
[facility] PROGRESS NOTE   Katherine Herrera OLM:[id number] DOB: 02-Jul-1966 DOA: 01/28/2014 PCP: Walker Kehr, MD  HPI/Subjective: Seen with husband at bedside.  Assessment/Plan: Principal Problem:   Rash Active Problems:   Unspecified hypothyroidism   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   Rash -Petechial, non-tender non-blanchable. -Appears to be vasculitic rash, ESR and CRP elevated. -Has negative ANCA screen and a negative ANA. Rheumatoid factor is 14. -Skin biopsy was done earlier today by general surgery, seen by ID. -Check cryoglobulins, hep C antibodies. -Tried to speak to rheumatology, will try and a.m.  Type 2 diabetes mellitus -Patient reported peripheral sensory neuropathy. -Uncontrolled diabetes mellitus with hemoglobin A1c of 7.7. -Continue home medications, we might need to adjust the insulin regimen as patient is on NPH nightly only.   Hypothyroidism -Continue home medications, check TSH.  Code Status: Full code Family Communication: Plan discussed with the patient. Disposition Plan: Remains inpatient   Consultants:  ID  Procedures:  None  Antibiotics:  Vancomycin, discontinued on 4/1   Objective: Filed Vitals:   01/29/14 1449  BP: 121/79  Pulse: 88  Temp: 98.4 F (36.9 C)  Resp: 18    Intake/Output Summary (Last 24 hours) at 01/29/14 1757 Last data filed at 01/29/14 0049  Gross per 24 hour  Intake    440 ml  Output      0 ml  Net    440 ml   Filed Weights   01/28/14 1203 01/28/14 1848  Weight: 121.836 kg (268 lb 9.6 oz) 118.117 kg (260 lb 6.4 oz)    Exam: General: Alert and awake, oriented x3, not in any acute distress. HEENT: anicteric sclera, pupils reactive to light and accommodation, EOMI CVS: S1-S2 clear, no murmur rubs or gallops Chest: clear to auscultation bilaterally, no wheezing, rales or rhonchi Abdomen: soft nontender, nondistended, normal bowel sounds, no organomegaly Extremities: no  cyanosis, clubbing or edema noted bilaterally Neuro: Cranial nerves II-XII intact, no focal neurological deficits  Data Reviewed: Basic Metabolic Panel:  Recent Labs Lab 01/28/14 1346 01/29/14 0640  NA 139 134*  K 4.6 5.1  CL 99 93*  CO2 28 25  GLUCOSE 115* 317*  BUN 10 18  CREATININE 0.72 0.72  CALCIUM 9.7 9.2   Liver Function Tests:  Recent Labs Lab 01/28/14 1346  AST 17  ALT 11  ALKPHOS 126*  BILITOT 0.4  PROT 8.0  ALBUMIN 3.2*   No results found for this basename: LIPASE, AMYLASE,  in the last 168 hours No results found for this basename: AMMONIA,  in the last 168 hours CBC:  Recent Labs Lab 01/28/14 1346 01/29/14 0640  WBC 9.7 16.6*  NEUTROABS 6.4  --   HGB 14.6 13.7  HCT 43.0 40.5  MCV 89.6 89.8  PLT 342 402*   Cardiac Enzymes: No results found for this basename: CKTOTAL, CKMB, CKMBINDEX, TROPONINI,  in the last 168 hours BNP (last 3 results) No results found for this basename: PROBNP,  in the last 8760 hours CBG:  Recent Labs Lab 01/28/14 2131 01/29/14 0751 01/29/14 1210 01/29/14 1714  GLUCAP 396* 298* 346* 423*    Micro Recent Results (from the past 240 hour(s))  CULTURE, BLOOD (ROUTINE X 2)     Status: None   Collection Time    01/28/14  3:55 PM      Result Value Ref Range Status   Specimen Description BLOOD RIGHT ANTECUBITAL   Final   Special Requests BOTTLES DRAWN AEROBIC AND ANAEROBIC 10MLS  Final   Culture  Setup Time     Final   Value: 01/28/2014 20:00     Performed at [facility]   Culture     Final   Value:        BLOOD CULTURE RECEIVED NO GROWTH TO DATE CULTURE WILL BE HELD FOR 5 DAYS BEFORE ISSUING A FINAL NEGATIVE REPORT     Performed at [facility]   Report Status PENDING   Incomplete  CULTURE, BLOOD (ROUTINE X 2)     Status: None   Collection Time    01/28/14  4:05 PM      Result Value Ref Range Status   Specimen Description BLOOD LEFT ANTECUBITAL   Final   Special Requests BOTTLES DRAWN AEROBIC  AND ANAEROBIC 10MLS   Final   Culture  Setup Time     Final   Value: 01/28/2014 20:00     Performed at [facility]   Culture     Final   Value:        BLOOD CULTURE RECEIVED NO GROWTH TO DATE CULTURE WILL BE HELD FOR 5 DAYS BEFORE ISSUING A FINAL NEGATIVE REPORT     Performed at [facility]   Report Status PENDING   Incomplete     Studies: No results found.  Scheduled Meds: . baclofen  20 mg Oral BID  . cholecalciferol  2,000 Units Oral Daily  . estradiol  1 mg Oral Daily  . gabapentin  800 mg Oral TID  . heparin  5,000 Units Subcutaneous 3 times per day  . insulin aspart  0-15 Units Subcutaneous TID WC  . insulin aspart  0-5 Units Subcutaneous QHS  . insulin NPH Human  35 Units Subcutaneous QHS  . lamoTRIgine  25 mg Oral BID  . levothyroxine  75 mcg Oral Daily  . lidocaine (PF)  5 mL Intradermal Once  . lidocaine (PF)      . vitamin B-12  1,000 mcg Oral Daily   Continuous Infusions:      Time spent: 35 minutes    [facility]  [facility] Pager [phone] If 7PM-7AM, please contact night-coverage at [email or web address], password [facility] 01/29/2014, 5:57 PM  LOS: 1 day

## 2014-01-29 NOTE — Consult Note (Signed)
[facility]    Date of Admission:  01/28/2014  Date of Consult:  01/29/2014  Reason for Consult: skin rash Referring Physician: Dr. Hartford Poli   HPI: Katherine Herrera is an 48 y.o. female with past medical history significant for apparent fibromyalgia diabetes mellitus who presented to her primary care physician in late March with a history of a worsening rash that occurred over several weeks in time.  Her PCP Dr. Henderson [place] suspected possible vasculitis and gave her shot of solumedrol and topical steroid along with Doxycyline (I presume for concern for RMSF, ERhlichia). Despite these therapies her rash has worsened and her lesions that have put petechial purpuric-like appearance have coalesced along her foot feet. She also has new fresh lesions on her abdomen. She was admitted to the hospitalist service last night and started on IV vancomycin she was seen by general surgery for biopsy today. She had various serologies and labs sent off in the interim.  She's not known to have viral hepatitis or HIV although tests have not yet been sent. She states that she has never been known to have a connective tissue disease although I do not know she's had much of a workup when she was diagnosed with fibromyalgia. She does have an elevated sedimentation rate and rheumatoid factor ANA so far have been negative.  She states she's never had any lesions like this in the past.  She is without fever except on one brief occasion. She has not been exposed to any chemicals or new detergents. She has not put her legs in a hot tub or fresh or salt water recently.   Past Medical History  Diagnosis Date  . Syncope     poss conversion disorder vs psychogenic seizures  . DM (diabetes mellitus)   . Hypothyroidism   . LBP (low back pain)     DR Patrice Paradise  . Peripheral neuropathy   . COPD (chronic obstructive pulmonary disease)   . Vitamin D deficiency   . Migraine   . Hyperlipidemia   .  Anxiety   . Gout     possible  . Tachycardia     diet coke overuse?  . TTS (tarsal tunnel syndrome) 2010    Left Foot [place] in [place]    Past Surgical History  Procedure Laterality Date  . Cervical fusion      C4-5  . Abdominal hysterectomy    . Carpal tunnel release  2010    LEFT  ergies:   Allergies  Allergen Reactions  . Crestor [Rosuvastatin Calcium]     myalgia  . Lovastatin Other (See Comments)    Unknown   . Metformin Nausea And Vomiting  . Niacin Other (See Comments)    Unknown   . Paroxetine Other (See Comments)    Unknown   . Meperidine Hcl Rash     Medications: I have reviewed patients current medications as documented in Epic Anti-infectives   Start     Dose/Rate Route Frequency Ordered Stop   01/29/14 0000  vancomycin (VANCOCIN) IVPB 1000 mg/200 mL premix  Status:  Discontinued     1,000 mg 200 mL/hr over 60 Minutes Intravenous Every 8 hours 01/28/14 1950 01/29/14 1151   01/28/14 1445  vancomycin (VANCOCIN) IVPB 1000 mg/200 mL premix     1,000 mg 200 mL/hr over 60 Minutes Intravenous  Once 01/28/14 1441 01/28/14 1705      Social History:  reports that she quit smoking about 4 years ago. She  has never used smokeless tobacco. She reports that she does not drink alcohol or use illicit drugs.  Family History  Problem Relation Age of Onset  . Diabetes Mother   . Hypertension Mother   . Cancer Father 89    ? bone cancer    As in HPI and primary teams notes otherwise 12 point review of systems is negative  Blood pressure 108/68, pulse 84, temperature 97.6 F (36.4 C), temperature source Oral, resp. rate 18, height '5\' 9"'  (1.753 m), weight 260 lb 6.4 oz (118.117 kg), SpO2 90.00%. General: Alert and awake, oriented x3, not in any acute distress. HEENT: anicteric sclera, pupils reactive to light and accommodation, EOMI, oropharynx clear and without exudate CVS regular rate, normal r,  no murmur rubs or gallops Chest: clear to auscultation bilaterally, no  wheezing, rales or rhonchi Abdomen: soft nontender, nondistended, normal bowel sounds,  Impressive purpuric like rash with coalescence on ankles. See pictures below                Neuro: nonfocal, strength and sensation intact   Results for orders placed during the hospital encounter of 01/28/14 (from the past 48 hour(s))  CBC WITH DIFFERENTIAL     Status: None   Collection Time    01/28/14  1:46 PM      Result Value Ref Range   WBC 9.7  4.0 - 10.5 K/uL   RBC 4.80  3.87 - 5.11 MIL/uL   Hemoglobin 14.6  12.0 - 15.0 g/dL   HCT 43.0  36.0 - 46.0 %   MCV 89.6  78.0 - 100.0 fL   MCH 30.4  26.0 - 34.0 pg   MCHC 34.0  30.0 - 36.0 g/dL   RDW 13.6  11.5 - 15.5 %   Platelets 342  150 - 400 K/uL   Neutrophils Relative % 67  43 - 77 %   Neutro Abs 6.4  1.7 - 7.7 K/uL   Lymphocytes Relative 26  12 - 46 %   Lymphs Abs 2.5  0.7 - 4.0 K/uL   Monocytes Relative 6  3 - 12 %   Monocytes Absolute 0.6  0.1 - 1.0 K/uL   Eosinophils Relative 1  0 - 5 %   Eosinophils Absolute 0.1  0.0 - 0.7 K/uL   Basophils Relative 0  0 - 1 %   Basophils Absolute 0.0  0.0 - 0.1 K/uL  COMPREHENSIVE METABOLIC PANEL     Status: Abnormal   Collection Time    01/28/14  1:46 PM      Result Value Ref Range   Sodium 139  137 - 147 mEq/L   Potassium 4.6  3.7 - 5.3 mEq/L   Chloride 99  96 - 112 mEq/L   CO2 28  19 - 32 mEq/L   Glucose, Bld 115 (*) 70 - 99 mg/dL   BUN 10  6 - 23 mg/dL   Creatinine, Ser 0.72  0.50 - 1.10 mg/dL   Calcium 9.7  8.4 - 10.5 mg/dL   Total Protein 8.0  6.0 - 8.3 g/dL   Albumin 3.2 (*) 3.5 - 5.2 g/dL   AST 17  0 - 37 U/L   ALT 11  0 - 35 U/L   Alkaline Phosphatase 126 (*) 39 - 117 U/L   Total Bilirubin 0.4  0.3 - 1.2 mg/dL   GFR calc non Af Amer >90  >90 mL/min   GFR calc Af Amer >90  >90 mL/min   Comment: (NOTE)  The eGFR has been calculated using the CKD EPI equation.     This calculation has not been validated in all clinical situations.     eGFR's persistently <90  mL/min signify possible Chronic Kidney     Disease.  SEDIMENTATION RATE     Status: Abnormal   Collection Time    01/28/14  1:46 PM      Result Value Ref Range   Sed Rate 50 (*) 0 - 22 mm/hr  HEMOGLOBIN A1C     Status: Abnormal   Collection Time    01/28/14  1:46 PM      Result Value Ref Range   Hemoglobin A1C 7.8 (*) <5.7 %   Comment: (NOTE)                                                                               According to the ADA Clinical Practice Recommendations for 2011, when     HbA1c is used as a screening test:      >=6.5%   Diagnostic of Diabetes Mellitus               (if abnormal result is confirmed)     5.7-6.4%   Increased risk of developing Diabetes Mellitus     References:Diagnosis and Classification of Diabetes Mellitus,Diabetes     RFFM,3846,65(LDJTT 1):S62-S69 and Standards of Medical Care in             Diabetes - 2011,Diabetes Care,2011,34 (Suppl 1):S11-S61.   Mean Plasma Glucose 177 (*) <117 mg/dL   Comment: Performed at [facility], ED     Status: None   Collection Time    01/28/14  2:16 PM      Result Value Ref Range   Lactic Acid, Venous 0.98  0.5 - 2.2 mmol/L  PROTIME-INR     Status: None   Collection Time    01/28/14  2:54 PM      Result Value Ref Range   Prothrombin Time 12.5  11.6 - 15.2 seconds   INR 0.95  0.00 - 1.49  APTT     Status: None   Collection Time    01/28/14  2:54 PM      Result Value Ref Range   aPTT 33  24 - 37 seconds  C-REACTIVE PROTEIN     Status: Abnormal   Collection Time    01/28/14  3:54 PM      Result Value Ref Range   CRP 5.2 (*) <0.60 mg/dL   Comment: Performed at [facility]     Status: None   Collection Time    01/28/14  3:54 PM      Result Value Ref Range   Complement C4, Body Fluid 31  10 - 40 mg/dL   Comment: Performed at [facility]     Status: Abnormal   Collection Time    01/28/14  3:54 PM      Result Value Ref  Range   C3 Complement 193 (*) 90 - 180 mg/dL   Comment: Performed at [facility], BLOOD (ROUTINE X 2)     Status:  None   Collection Time    01/28/14  3:55 PM      Result Value Ref Range   Specimen Description BLOOD RIGHT ANTECUBITAL     Special Requests BOTTLES DRAWN AEROBIC AND ANAEROBIC 10MLS     Culture  Setup Time       Value: 01/28/2014 20:00     Performed at [facility]   Culture       Value:        BLOOD CULTURE RECEIVED NO GROWTH TO DATE CULTURE WILL BE HELD FOR 5 DAYS BEFORE ISSUING A FINAL NEGATIVE REPORT     Performed at [facility]   Report Status PENDING    CULTURE, BLOOD (ROUTINE X 2)     Status: None   Collection Time    01/28/14  4:05 PM      Result Value Ref Range   Specimen Description BLOOD LEFT ANTECUBITAL     Special Requests BOTTLES DRAWN AEROBIC AND ANAEROBIC 10MLS     Culture  Setup Time       Value: 01/28/2014 20:00     Performed at [facility]   Culture       Value:        BLOOD CULTURE RECEIVED NO GROWTH TO DATE CULTURE WILL BE HELD FOR 5 DAYS BEFORE ISSUING A FINAL NEGATIVE REPORT     Performed at [facility]   Report Status PENDING    MPO/PR-3 (ANCA) ANTIBODIES     Status: None   Collection Time    01/28/14  4:13 PM      Result Value Ref Range   Myeloperoxidase Abs <1.0  <1.0 AI   Comment: (NOTE)                                  Value   Interpretation                                 <1.0 AI: No Antibody Detected                              >or=1.0 AI: Antibody Detected     Autoantibodies to myeloperoxidase (MPO) are commonly     associated with the following small-vessel     vasculitides: microscopic polyangiitis,     polyarteritis nodosa, Churg-Strauss syndrome,     necrotizing and crescentic glomerulonephritis and     occasionally Wegener's granulomatosis. The perinuclear     IFA pattern, (p-ANCA), is based largely on autoantibody     to myeloperoxidase which serves as the primary  antigen     These autoantibodies are present in active disease     state.   Serine Protease 3 <1.0  <1.0 AI   Comment: (NOTE)                                  Value   Interpretation                                 <1.0 AI: No Antibody Detected                              >  or=1.0 AI: Antibody Detected     Autoantibodies to proteinase-3 (PR-3) are accepted as     characteristic for granulomatosis with polyangiitis     (Wegener's), and are detectable in 95% of the     histologically proven cases. The cytoplasmic IFA     pattern, (c-ANCA), is based largely on autoantibody to     PR-3 which serves as the primary antigen.     These autoantibodies are present in active disease     state.     Performed at [facility], [facility]     Status: Abnormal   Collection Time    01/28/14  9:31 PM      Result Value Ref Range   Glucose-Capillary 396 (*) 70 - 99 mg/dL  BASIC METABOLIC PANEL     Status: Abnormal   Collection Time    01/29/14  6:40 AM      Result Value Ref Range   Sodium 134 (*) 137 - 147 mEq/L   Potassium 5.1  3.7 - 5.3 mEq/L   Chloride 93 (*) 96 - 112 mEq/L   CO2 25  19 - 32 mEq/L   Glucose, Bld 317 (*) 70 - 99 mg/dL   BUN 18  6 - 23 mg/dL   Creatinine, Ser 0.72  0.50 - 1.10 mg/dL   Calcium 9.2  8.4 - 10.5 mg/dL   GFR calc non Af Amer >90  >90 mL/min   GFR calc Af Amer >90  >90 mL/min   Comment: (NOTE)     The eGFR has been calculated using the CKD EPI equation.     This calculation has not been validated in all clinical situations.     eGFR's persistently <90 mL/min signify possible Chronic Kidney     Disease.  CBC     Status: Abnormal   Collection Time    01/29/14  6:40 AM      Result Value Ref Range   WBC 16.6 (*) 4.0 - 10.5 K/uL   RBC 4.51  3.87 - 5.11 MIL/uL   Hemoglobin 13.7  12.0 - 15.0 g/dL   HCT 40.5  36.0 - 46.0 %   MCV 89.8  78.0 - 100.0 fL   MCH 30.4  26.0 - 34.0 pg   MCHC 33.8  30.0 - 36.0 g/dL   RDW 13.3  11.5 - 15.5 %   Platelets 402  (*) 150 - 400 K/uL  GLUCOSE, CAPILLARY     Status: Abnormal   Collection Time    01/29/14  7:51 AM      Result Value Ref Range   Glucose-Capillary 298 (*) 70 - 99 mg/dL  GLUCOSE, CAPILLARY     Status: Abnormal   Collection Time    01/29/14 12:10 PM      Result Value Ref Range   Glucose-Capillary 346 (*) 70 - 99 mg/dL      Component Value Date/Time   SDES BLOOD LEFT ANTECUBITAL 01/28/2014 1605   SPECREQUEST BOTTLES DRAWN AEROBIC AND ANAEROBIC 10MLS 01/28/2014 1605   CULT  Value:        BLOOD CULTURE RECEIVED NO GROWTH TO DATE CULTURE WILL BE HELD FOR 5 DAYS BEFORE ISSUING A FINAL NEGATIVE REPORT Performed at [facility] 01/28/2014 1605   REPTSTATUS PENDING 01/28/2014 1605   No results found.   Recent Results (from the past 720 hour(s))  CULTURE, BLOOD (ROUTINE X 2)     Status: None   Collection Time    01/28/14  3:55 PM  Result Value Ref Range Status   Specimen Description BLOOD RIGHT ANTECUBITAL   Final   Special Requests BOTTLES DRAWN AEROBIC AND ANAEROBIC 10MLS   Final   Culture  Setup Time     Final   Value: 01/28/2014 20:00     Performed at [facility]   Culture     Final   Value:        BLOOD CULTURE RECEIVED NO GROWTH TO DATE CULTURE WILL BE HELD FOR 5 DAYS BEFORE ISSUING A FINAL NEGATIVE REPORT     Performed at [facility]   Report Status PENDING   Incomplete  CULTURE, BLOOD (ROUTINE X 2)     Status: None   Collection Time    01/28/14  4:05 PM      Result Value Ref Range Status   Specimen Description BLOOD LEFT ANTECUBITAL   Final   Special Requests BOTTLES DRAWN AEROBIC AND ANAEROBIC 10MLS   Final   Culture  Setup Time     Final   Value: 01/28/2014 20:00     Performed at [facility]   Culture     Final   Value:        BLOOD CULTURE RECEIVED NO GROWTH TO DATE CULTURE WILL BE HELD FOR 5 DAYS BEFORE ISSUING A FINAL NEGATIVE REPORT     Performed at [facility]   Report Status PENDING   Incomplete      Impression/Recommendation #1 Extensive and progressive rash:  Agree with sending cryoglobulins, ANA, RF, ANCA, complement.  I will send Hepatitis panel, HIV ab, HIV RNA, will also send parvovirus panel  She does not need IV vancomycin  Given her LACK of improvement on doxy this CANNOT be RMSF  Hopefully biopsy will give [place] the answer  Would consider urine drug screen though she denies cocaine use or recreational drug use  Would at see if Dermatologist could provide telephonic consultation and examine pics if empiric steroids being considered. She has received one dose of Solu-Medrol.   Thank you so much for this interesting consult  [facility] [place] [phone] (pager) [phone] (office) 01/29/2014, 1:21 PM  Rhina [place] 01/29/2014, 1:21 PM

## 2014-01-29 NOTE — Consult Note (Signed)
Katherine Herrera 1966/01/31  [id number].   Requesting MD: Dr. Estill Cotta  Chief Complaint/Reason for Consult: skin rash, needs biopsy HPI: This is a 48 yo female who developed a rash on her feet about 3 weeks ago.  It has since spread up her legs, but mostly aggregates below her knees.  She just noticed a few areas on her lower central abdomen.  After about a week, she saw her PCP who tried abx therapy and a cream.  Neither of these helped, except the cream decreased some of the burning.  Her pain and rash persisted.  She was noted a have a low grade fever several days ago.  She presented to the [facility], where she was admitted for further evaluation.  We have been asked to see the patient for biopsies.  ROS: Please see HPI, otherwise all other systems are negative  Family History  Problem Relation Age of Onset  . Diabetes Mother   . Hypertension Mother   . Cancer Father 40    ? bone cancer    Past Medical History  Diagnosis Date  . Syncope     poss conversion disorder vs psychogenic seizures  . DM (diabetes mellitus)   . Hypothyroidism   . LBP (low back pain)     DR Patrice Paradise  . Peripheral neuropathy   . COPD (chronic obstructive pulmonary disease)   . Vitamin D deficiency   . Migraine   . Hyperlipidemia   . Anxiety   . Gout     possible  . Tachycardia     diet coke overuse?  . TTS (tarsal tunnel syndrome) 2010    Left Foot [facility] in [place]    Past Surgical History  Procedure Laterality Date  . Cervical fusion      C4-5  . Abdominal hysterectomy    . Carpal tunnel release  2010    LEFT    Social History:  reports that she quit smoking about 4 years ago. She has never used smokeless tobacco. She reports that she does not drink alcohol or use illicit drugs.  Allergies:  Allergies  Allergen Reactions  . Crestor [Rosuvastatin Calcium]     myalgia  . Lovastatin Other (See Comments)    Unknown   . Metformin Nausea And Vomiting  . Niacin Other (See Comments)    Unknown   .  Paroxetine Other (See Comments)    Unknown   . Meperidine Hcl Rash    Medications Prior to Admission  Medication Sig Dispense Refill  . baclofen (LIORESAL) 10 MG tablet Take 2 tablets (20 mg total) by mouth 2 (two) times daily.  360 each  1  . Cholecalciferol (VITAMIN D3) 1000 UNIT tablet Take 2,000 Units by mouth daily.        . clonazePAM (KLONOPIN) 0.5 MG tablet TAKE ONE TABLET BY MOUTH 2 TIMES A DAY AS NEEDED FOR ANXIETY      . doxycycline (VIBRA-TABS) 100 MG tablet Take 100 mg by mouth 2 (two) times daily.      Marland Kitchen estradiol (ESTRACE) 1 MG tablet Take 1 mg by mouth daily.        Marland Kitchen gabapentin (NEURONTIN) 800 MG tablet Take 1 tablet (800 mg total) by mouth 3 (three) times daily.  270 tablet  1  . HYDROcodone-acetaminophen (NORCO) 10-325 MG per tablet TAKE ONE TABLET BY MOUTH EVERY 6 HOURS AS NEEDED FOR PAIN  Please fill on or after 03/09/14  120 tablet  0  . insulin NPH Human (HUMULIN  N,NOVOLIN N) 100 UNIT/ML injection Inject 35 Units into the skin at bedtime.      . insulin regular (NOVOLIN R,HUMULIN R) 100 units/mL injection Inject 35-45 Units into the skin 3 (three) times daily before meals. 35 units every morning, 20 units at lunch and 45 units in the evening      . lamoTRIgine (LAMICTAL) 25 MG tablet Take 1 tablet (25 mg total) by mouth 2 (two) times daily.  180 tablet  1  . levothyroxine (SYNTHROID, LEVOTHROID) 75 MCG tablet Take 1 tablet (75 mcg total) by mouth daily.  90 tablet  3  . PRESCRIPTION MEDICATION Apply 1-2 g topically See admin instructions. Apply 1-2 grams (1-2 ML) to affected area 2 to 3 times a day Diclofenac/Baclofen/Bupivacane      . rosuvastatin (CRESTOR) 10 MG tablet Take 10 mg by mouth See admin instructions. Take 1 tablet every 3 days      . traMADol (ULTRAM) 50 MG tablet Take 1-2 tablets (50-100 mg total) by mouth 2 (two) times daily as needed (headache).  180 tablet  0  . triamcinolone ointment (KENALOG) 0.5 % Apply 1 application topically 2 (two) times daily.   90 g  1  . vitamin B-12 (CYANOCOBALAMIN) 1000 MCG tablet Take 1,000 mcg by mouth daily.        Marland Kitchen zolpidem (AMBIEN) 10 MG tablet Take 0.5-1 tablets (5-10 mg total) by mouth at bedtime as needed. For insomnia  90 tablet  1  . ACCU-CHEK SOFTCLIX LANCETS lancets       . B-D INS SYRINGE 0.5CC/31GX5/16 31G X 5/16" 0.5 ML MISC       . BD PEN NEEDLE NANO U/F 32G X 4 MM MISC       . Blood Glucose Monitoring Suppl (ACCU-CHEK AVIVA PLUS) W/DEVICE KIT USE AS DIRECTED  1 kit  0  . glucose blood (ACCU-CHEK AVIVA PLUS) test strip As directed  100 each  11  . Lancet Devices (EASY TOUCH LANCING DEVICE) MISC         Blood pressure 108/68, pulse 84, temperature 97.6 F (36.4 C), temperature source Oral, resp. rate 18, height '5\' 9"'  (1.753 m), weight 260 lb 6.4 oz (118.117 kg), SpO2 90.00%. Physical Exam: General: pleasant, WD, WN, obese white female who is laying in bed in NAD HEENT: head is normocephalic, atraumatic.  Sclera are noninjected.  PERRL.  Ears and nose without any masses or lesions.  Mouth is pink and moist Abd: soft, NT, ND, +BS, obese, some nonblanching petechial type lesions on the central lower portion of her abdomen.  She has some cat scratches on the upper portion of her abdomen. no masses, hernias, or organomegaly Skin: warm and dry with no masses, lesions.  She has a diffuse rash on her bilateral lower extremities aggregating below the knees.  This is a nonblanching, maculopapular rash with a few areas that appear petechial in nature as well.  Psych: A&Ox3 with an appropriate affect.    Results for orders placed during the hospital encounter of 01/28/14 (from the past 48 hour(s))  CBC WITH DIFFERENTIAL     Status: None   Collection Time    01/28/14  1:46 PM      Result Value Ref Range   WBC 9.7  4.0 - 10.5 K/uL   RBC 4.80  3.87 - 5.11 MIL/uL   Hemoglobin 14.6  12.0 - 15.0 g/dL   HCT 43.0  36.0 - 46.0 %   MCV 89.6  78.0 - 100.0 fL  MCH 30.4  26.0 - 34.0 pg   MCHC 34.0  30.0 - 36.0  g/dL   RDW 13.6  11.5 - 15.5 %   Platelets 342  150 - 400 K/uL   Neutrophils Relative % 67  43 - 77 %   Neutro Abs 6.4  1.7 - 7.7 K/uL   Lymphocytes Relative 26  12 - 46 %   Lymphs Abs 2.5  0.7 - 4.0 K/uL   Monocytes Relative 6  3 - 12 %   Monocytes Absolute 0.6  0.1 - 1.0 K/uL   Eosinophils Relative 1  0 - 5 %   Eosinophils Absolute 0.1  0.0 - 0.7 K/uL   Basophils Relative 0  0 - 1 %   Basophils Absolute 0.0  0.0 - 0.1 K/uL  COMPREHENSIVE METABOLIC PANEL     Status: Abnormal   Collection Time    01/28/14  1:46 PM      Result Value Ref Range   Sodium 139  137 - 147 mEq/L   Potassium 4.6  3.7 - 5.3 mEq/L   Chloride 99  96 - 112 mEq/L   CO2 28  19 - 32 mEq/L   Glucose, Bld 115 (*) 70 - 99 mg/dL   BUN 10  6 - 23 mg/dL   Creatinine, Ser 0.72  0.50 - 1.10 mg/dL   Calcium 9.7  8.4 - 10.5 mg/dL   Total Protein 8.0  6.0 - 8.3 g/dL   Albumin 3.2 (*) 3.5 - 5.2 g/dL   AST 17  0 - 37 U/L   ALT 11  0 - 35 U/L   Alkaline Phosphatase 126 (*) 39 - 117 U/L   Total Bilirubin 0.4  0.3 - 1.2 mg/dL   GFR calc non Af Amer >90  >90 mL/min   GFR calc Af Amer >90  >90 mL/min   Comment: (NOTE)     The eGFR has been calculated using the CKD EPI equation.     This calculation has not been validated in all clinical situations.     eGFR's persistently <90 mL/min signify possible Chronic Kidney     Disease.  SEDIMENTATION RATE     Status: Abnormal   Collection Time    01/28/14  1:46 PM      Result Value Ref Range   Sed Rate 50 (*) 0 - 22 mm/hr  HEMOGLOBIN A1C     Status: Abnormal   Collection Time    01/28/14  1:46 PM      Result Value Ref Range   Hemoglobin A1C 7.8 (*) <5.7 %   Comment: (NOTE)                                                                               According to the ADA Clinical Practice Recommendations for 2011, when     HbA1c is used as a screening test:      >=6.5%   Diagnostic of Diabetes Mellitus               (if abnormal result is confirmed)     5.7-6.4%    Increased risk of developing Diabetes Mellitus     References:Diagnosis and Classification of Diabetes  Mellitus,Diabetes     PIRJ,1884,16(SAYTK 1):S62-S69 and Standards of Medical Care in             Diabetes - 2011,Diabetes ZSWF,0932,35 (Suppl 1):S11-S61.   Mean Plasma Glucose 177 (*) <117 mg/dL   Comment: Performed at [facility], ED     Status: None   Collection Time    01/28/14  2:16 PM      Result Value Ref Range   Lactic Acid, Venous 0.98  0.5 - 2.2 mmol/L  PROTIME-INR     Status: None   Collection Time    01/28/14  2:54 PM      Result Value Ref Range   Prothrombin Time 12.5  11.6 - 15.2 seconds   INR 0.95  0.00 - 1.49  APTT     Status: None   Collection Time    01/28/14  2:54 PM      Result Value Ref Range   aPTT 33  24 - 37 seconds  C-REACTIVE PROTEIN     Status: Abnormal   Collection Time    01/28/14  3:54 PM      Result Value Ref Range   CRP 5.2 (*) <0.60 mg/dL   Comment: Performed at [place]     Status: None   Collection Time    01/28/14  3:54 PM      Result Value Ref Range   Complement C4, Body Fluid 31  10 - 40 mg/dL   Comment: Performed at [facility]     Status: Abnormal   Collection Time    01/28/14  3:54 PM      Result Value Ref Range   C3 Complement 193 (*) 90 - 180 mg/dL   Comment: Performed at [facility], BLOOD (ROUTINE X 2)     Status: None   Collection Time    01/28/14  3:55 PM      Result Value Ref Range   Specimen Description BLOOD RIGHT ANTECUBITAL     Special Requests BOTTLES DRAWN AEROBIC AND ANAEROBIC 10MLS     Culture  Setup Time       Value: 01/28/2014 20:00     Performed at [facility]   Culture       Value:        BLOOD CULTURE RECEIVED NO GROWTH TO DATE CULTURE WILL BE HELD FOR 5 DAYS BEFORE ISSUING A FINAL NEGATIVE REPORT     Performed at [facility]   Report Status PENDING    CULTURE, BLOOD (ROUTINE X 2)      Status: None   Collection Time    01/28/14  4:05 PM      Result Value Ref Range   Specimen Description BLOOD LEFT ANTECUBITAL     Special Requests BOTTLES DRAWN AEROBIC AND ANAEROBIC 10MLS     Culture  Setup Time       Value: 01/28/2014 20:00     Performed at [facility]   Culture       Value:        BLOOD CULTURE RECEIVED NO GROWTH TO DATE CULTURE WILL BE HELD FOR 5 DAYS BEFORE ISSUING A FINAL NEGATIVE REPORT     Performed at [facility]   Report Status PENDING    GLUCOSE, CAPILLARY     Status: Abnormal   Collection Time    01/28/14  9:31 PM  Result Value Ref Range   Glucose-Capillary 396 (*) 70 - 99 mg/dL  BASIC METABOLIC PANEL     Status: Abnormal   Collection Time    01/29/14  6:40 AM      Result Value Ref Range   Sodium 134 (*) 137 - 147 mEq/L   Potassium 5.1  3.7 - 5.3 mEq/L   Chloride 93 (*) 96 - 112 mEq/L   CO2 25  19 - 32 mEq/L   Glucose, Bld 317 (*) 70 - 99 mg/dL   BUN 18  6 - 23 mg/dL   Creatinine, Ser 0.72  0.50 - 1.10 mg/dL   Calcium 9.2  8.4 - 10.5 mg/dL   GFR calc non Af Amer >90  >90 mL/min   GFR calc Af Amer >90  >90 mL/min   Comment: (NOTE)     The eGFR has been calculated using the CKD EPI equation.     This calculation has not been validated in all clinical situations.     eGFR's persistently <90 mL/min signify possible Chronic Kidney     Disease.  CBC     Status: Abnormal   Collection Time    01/29/14  6:40 AM      Result Value Ref Range   WBC 16.6 (*) 4.0 - 10.5 K/uL   RBC 4.51  3.87 - 5.11 MIL/uL   Hemoglobin 13.7  12.0 - 15.0 g/dL   HCT 40.5  36.0 - 46.0 %   MCV 89.8  78.0 - 100.0 fL   MCH 30.4  26.0 - 34.0 pg   MCHC 33.8  30.0 - 36.0 g/dL   RDW 13.3  11.5 - 15.5 %   Platelets 402 (*) 150 - 400 K/uL  GLUCOSE, CAPILLARY     Status: Abnormal   Collection Time    01/29/14  7:51 AM      Result Value Ref Range   Glucose-Capillary 298 (*) 70 - 99 mg/dL   No results found.     Assessment/Plan 1. Rash, presumed some  type of vasculitis  Plan: 1. I will take 2 separate skin biopsies from her leg.  I will take two separate ones for pathology.  One will be put in formalin and one in Michele's media for appropriate testing of vasculitis.  We will defer further work up to the primary team.  Deaire Mcwhirter E 01/29/2014, 10:24 AM Pager: [phone]

## 2014-01-30 ENCOUNTER — Encounter: Payer: Self-pay | Admitting: Internal Medicine

## 2014-01-30 DIAGNOSIS — I776 Arteritis, unspecified: Principal | ICD-10-CM

## 2014-01-30 DIAGNOSIS — R209 Unspecified disturbances of skin sensation: Secondary | ICD-10-CM

## 2014-01-30 DIAGNOSIS — L959 Vasculitis limited to the skin, unspecified: Secondary | ICD-10-CM | POA: Diagnosis present

## 2014-01-30 DIAGNOSIS — E039 Hypothyroidism, unspecified: Secondary | ICD-10-CM

## 2014-01-30 DIAGNOSIS — M109 Gout, unspecified: Secondary | ICD-10-CM

## 2014-01-30 LAB — BASIC METABOLIC PANEL WITH GFR
BUN: 22 mg/dL (ref 6–23)
CO2: 27 meq/L (ref 19–32)
Calcium: 9.3 mg/dL (ref 8.4–10.5)
Chloride: 91 meq/L — ABNORMAL LOW (ref 96–112)
Creatinine, Ser: 0.89 mg/dL (ref 0.50–1.10)
GFR calc Af Amer: 87 mL/min — ABNORMAL LOW
GFR calc non Af Amer: 75 mL/min — ABNORMAL LOW
Glucose, Bld: 181 mg/dL — ABNORMAL HIGH (ref 70–99)
Potassium: 4.6 meq/L (ref 3.7–5.3)
Sodium: 133 meq/L — ABNORMAL LOW (ref 137–147)

## 2014-01-30 LAB — GLUCOSE, CAPILLARY
Glucose-Capillary: 201 mg/dL — ABNORMAL HIGH (ref 70–99)
Glucose-Capillary: 254 mg/dL — ABNORMAL HIGH (ref 70–99)
Glucose-Capillary: 203 mg/dL — ABNORMAL HIGH (ref 70–99)
Glucose-Capillary: 282 mg/dL — ABNORMAL HIGH (ref 70–99)

## 2014-01-30 LAB — HEPATITIS C ANTIBODY (REFLEX): HCV Ab: NEGATIVE

## 2014-01-30 LAB — HIV ANTIBODY (ROUTINE TESTING W REFLEX): HIV: NONREACTIVE

## 2014-01-30 MED ORDER — INSULIN GLARGINE 100 UNIT/ML ~~LOC~~ SOLN
50.0000 [IU] | Freq: Every day | SUBCUTANEOUS | Status: DC
  Administered 2014-01-30: 50 [IU] via SUBCUTANEOUS
  Filled 2014-01-30 (×2): qty 0.5

## 2014-01-30 MED ORDER — HYDROMORPHONE HCL PF 1 MG/ML IJ SOLN
0.5000 mg | INTRAMUSCULAR | Status: DC | PRN
  Administered 2014-01-30 – 2014-01-31 (×6): 0.5 mg via INTRAVENOUS
  Filled 2014-01-30 (×6): qty 1

## 2014-01-30 MED ORDER — DIPHENHYDRAMINE HCL 25 MG PO CAPS
25.0000 mg | ORAL_CAPSULE | Freq: Four times a day (QID) | ORAL | Status: DC | PRN
  Administered 2014-01-30 – 2014-01-31 (×5): 25 mg via ORAL
  Filled 2014-01-30 (×5): qty 1

## 2014-01-30 MED ORDER — HYDROMORPHONE HCL PF 1 MG/ML IJ SOLN: 2.0000 mg | INTRAMUSCULAR | Status: DC | PRN

## 2014-01-30 MED ORDER — HYDROMORPHONE HCL PF 1 MG/ML IJ SOLN
1.5000 mg | INTRAMUSCULAR | Status: DC | PRN
  Administered 2014-01-30: 1.5 mg via INTRAVENOUS
  Filled 2014-01-30: qty 2

## 2014-01-30 MED ORDER — HYDROMORPHONE HCL PF 1 MG/ML IJ SOLN
1.0000 mg | Freq: Once | INTRAMUSCULAR | Status: AC
  Administered 2014-01-30: 1 mg via INTRAVENOUS
  Filled 2014-01-30: qty 1

## 2014-01-30 MED ORDER — GABAPENTIN 400 MG PO CAPS
800.0000 mg | ORAL_CAPSULE | Freq: Four times a day (QID) | ORAL | Status: DC
  Administered 2014-01-30 – 2014-01-31 (×3): 800 mg via ORAL
  Filled 2014-01-30 (×6): qty 2

## 2014-01-30 MED ORDER — INSULIN ASPART 100 UNIT/ML ~~LOC~~ SOLN
0.0000 [IU] | Freq: Three times a day (TID) | SUBCUTANEOUS | Status: DC
  Administered 2014-01-30: 3 [IU] via SUBCUTANEOUS
  Administered 2014-01-30: 5 [IU] via SUBCUTANEOUS
  Administered 2014-01-30: 3 [IU] via SUBCUTANEOUS
  Administered 2014-01-31: 1 [IU] via SUBCUTANEOUS

## 2014-01-30 MED ORDER — GABAPENTIN 400 MG PO CAPS
800.0000 mg | ORAL_CAPSULE | Freq: Three times a day (TID) | ORAL | Status: DC
  Filled 2014-01-30 (×3): qty 2

## 2014-01-30 MED ORDER — DOCUSATE SODIUM 100 MG PO CAPS
100.0000 mg | ORAL_CAPSULE | Freq: Every day | ORAL | Status: DC | PRN
  Administered 2014-01-30: 100 mg via ORAL
  Filled 2014-01-30: qty 1

## 2014-01-30 MED ORDER — INSULIN ASPART 100 UNIT/ML ~~LOC~~ SOLN
10.0000 [IU] | Freq: Three times a day (TID) | SUBCUTANEOUS | Status: DC
  Administered 2014-01-30 – 2014-01-31 (×2): 10 [IU] via SUBCUTANEOUS

## 2014-01-30 MED ORDER — HYDROCODONE-ACETAMINOPHEN 5-325 MG PO TABS
2.0000 | ORAL_TABLET | ORAL | Status: DC | PRN
  Administered 2014-01-30 – 2014-01-31 (×7): 2 via ORAL
  Filled 2014-01-30 (×7): qty 2

## 2014-01-30 NOTE — Progress Notes (Addendum)
[facility] PROGRESS NOTE   Katherine Herrera JYN:[id number] DOB: Oct 30, 1966 DOA: 01/28/2014 PCP: Walker Kehr, MD  HPI/Subjective: Seen with husband at bedside. The seems to be improving.  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 -Appears to be vasculitic rash, ESR and CRP elevated. -Has negative ANCA screen and a negative ANA. Rheumatoid factor is 14. -Skin biopsy was done earlier today by general surgery, seen by ID. -Check cryoglobulins, hep C antibodies. -Skin biopsy showed superficial and mid dermal vessel vasculitis. No malignancy identified.  Type 2 diabetes mellitus -Patient reported peripheral sensory neuropathy. -Uncontrolled diabetes mellitus with hemoglobin A1c of 7.7. -Continue home medications, we might need to adjust the insulin regimen as patient is on NPH nightly only.  -Will switch the insulin to Lantus and NovoLog, benefits check showed that patient will probably be able to afford it. -Increase Lantus to 50 units at night, give meal coverage with NovoLog plus the sliding scale.  Hypothyroidism -Continue home medications, check TSH.  Foot pain -Patient is on Vicodin at home, She also on Neurontin. -Patient still complaining about burning pain in the foot and ask him to increase narcotics. -Neurontin dose increased, decreased Dilaudid.  Code Status: Full code Family Communication: Plan discussed with the patient. Disposition Plan: Remains inpatient   Consultants:  ID  Procedures:  None  Antibiotics:  Vancomycin, discontinued on 4/1   Objective: Filed Vitals:   01/30/14 1443  BP: 118/66  Pulse: 80  Temp: 97.2 F (36.2 C)  Resp: 18    Intake/Output Summary (Last 24 hours) at 01/30/14 1618 Last data filed at 01/30/14 0519  Gross per 24 hour  Intake    240 ml    Output   1000 ml  Net   -760 ml   Filed Weights   01/28/14 1203 01/28/14 1848  Weight: 121.836 kg (268 lb 9.6 oz) 118.117 kg (260 lb 6.4 oz)    Exam: General: Alert and awake, oriented x3, not in any acute distress. HEENT: anicteric sclera, pupils reactive to light and accommodation, EOMI CVS: S1-S2 clear, no murmur rubs or gallops Chest: clear to auscultation bilaterally, no wheezing, rales or rhonchi Abdomen: soft nontender, nondistended, normal bowel sounds, no organomegaly Extremities: no cyanosis, clubbing or edema noted bilaterally Neuro: Cranial nerves II-XII intact, no focal neurological deficits  Data Reviewed: Basic Metabolic Panel:  Recent Labs Lab 01/28/14 1346 01/29/14 0640 01/30/14 0850  NA 139 134* 133*  K 4.6 5.1 4.6  CL 99 93* 91*  CO2 _0 GLUCOSE 115* 317* 181*  BUN _1 CREATININE 0.72 0.72 0.89  CALCIUM 9.7 9.2 9.3   Liver Function Tests:  Recent Labs Lab 01/28/14 1346  AST 17  ALT 11  ALKPHOS 126*  BILITOT 0.4  PROT 8.0  ALBUMIN 3.2*   No results found for this basename: LIPASE, AMYLASE,  in the last 168 hours No results found for this basename: AMMONIA,  in the last 168 hours CBC:  Recent Labs Lab 01/28/14 1346 01/29/14 0640  WBC 9.7 16.6*  NEUTROABS 6.4  --   HGB 14.6 13.7  HCT 43.0 40.5  MCV 89.6 89.8  PLT 342 402*   Cardiac Enzymes: No results found for this basename: CKTOTAL, CKMB, CKMBINDEX, TROPONINI,  in the last 168 hours BNP (last 3 results) No results found for this basename: PROBNP,  in the last 8760 hours  CBG:  Recent Labs Lab 01/29/14 1210 01/29/14 1714 01/29/14 2200 01/30/14 0744 01/30/14 1212  GLUCAP 346* 423* 284* 201* 254*    Micro Recent Results (from the past 240 hour(s))  CULTURE, BLOOD (ROUTINE X 2)     Status: None   Collection Time    01/28/14  3:55 PM      Result Value Ref Range Status   Specimen Description BLOOD RIGHT ANTECUBITAL   Final   Special Requests BOTTLES DRAWN  AEROBIC AND ANAEROBIC 10MLS   Final   Culture  Setup Time     Final   Value: 01/28/2014 20:00     Performed at [facility]   Culture     Final   Value:        BLOOD CULTURE RECEIVED NO GROWTH TO DATE CULTURE WILL BE HELD FOR 5 DAYS BEFORE ISSUING A FINAL NEGATIVE REPORT     Performed at [facility]   Report Status PENDING   Incomplete  CULTURE, BLOOD (ROUTINE X 2)     Status: None   Collection Time    01/28/14  4:05 PM      Result Value Ref Range Status   Specimen Description BLOOD LEFT ANTECUBITAL   Final   Special Requests BOTTLES DRAWN AEROBIC AND ANAEROBIC 10MLS   Final   Culture  Setup Time     Final   Value: 01/28/2014 20:00     Performed at [facility]   Culture     Final   Value:        BLOOD CULTURE RECEIVED NO GROWTH TO DATE CULTURE WILL BE HELD FOR 5 DAYS BEFORE ISSUING A FINAL NEGATIVE REPORT     Performed at [facility]   Report Status PENDING   Incomplete     Studies: No results found.  Scheduled Meds: . baclofen  20 mg Oral BID  . cholecalciferol  2,000 Units Oral Daily  . estradiol  1 mg Oral Daily  . gabapentin  800 mg Oral TID WC & HS  . heparin  5,000 Units Subcutaneous 3 times per day  . insulin aspart  0-9 Units Subcutaneous TID WC  . insulin aspart  10 Units Subcutaneous TID WC  . insulin glargine  50 Units Subcutaneous QHS  . lamoTRIgine  25 mg Oral BID  . levothyroxine  75 mcg Oral Daily  . lidocaine (PF)  5 mL Intradermal Once  . vitamin B-12  1,000 mcg Oral Daily   Continuous Infusions:      Time spent: 35 minutes    ELMAHI,MUTAZ A  [facility] Pager [phone] If 7PM-7AM, please contact night-coverage at [email or web address], password [email or web address] 01/30/2014, 4:18 PM  LOS: 2 days               

## 2014-01-30 NOTE — Progress Notes (Signed)
[facility] for Infectious Disease    Subjective: Complaining  pain not well-controlled   Antibiotics:  Anti-infectives   Start     Dose/Rate Route Frequency Ordered Stop   01/29/14 0000  vancomycin (VANCOCIN) IVPB 1000 mg/200 mL premix  Status:  Discontinued     1,000 mg 200 mL/hr over 60 Minutes Intravenous Every 8 hours 01/28/14 1950 01/29/14 1151   01/28/14 1445  vancomycin (VANCOCIN) IVPB 1000 mg/200 mL premix     1,000 mg 200 mL/hr over 60 Minutes Intravenous  Once 01/28/14 1441 01/28/14 1705      Medications: Scheduled Meds: . baclofen  20 mg Oral BID  . cholecalciferol  2,000 Units Oral Daily  . estradiol  1 mg Oral Daily  . gabapentin  800 mg Oral QID  . heparin  5,000 Units Subcutaneous 3 times per day  . insulin aspart  0-9 Units Subcutaneous TID WC  . insulin aspart  10 Units Subcutaneous TID WC  . insulin glargine  50 Units Subcutaneous QHS  . lamoTRIgine  25 mg Oral BID  . levothyroxine  75 mcg Oral Daily  . lidocaine (PF)  5 mL Intradermal Once  . vitamin B-12  1,000 mcg Oral Daily   Continuous Infusions:  PRN Meds:.acetaminophen, acetaminophen, alum & mag hydroxide-simeth, clonazePAM, diphenhydrAMINE, HYDROcodone-acetaminophen, HYDROmorphone (DILAUDID) injection, ondansetron (ZOFRAN) IV, ondansetron, zolpidem    Objective: Weight change:   Intake/Output Summary (Last 24 hours) at 01/30/14 1729 Last data filed at 01/30/14 0519  Gross per 24 hour  Intake    240 ml  Output   1000 ml  Net   -760 ml   Blood pressure 118/66, pulse 80, temperature 97.2 F (36.2 C), temperature source Oral, resp. rate 18, height _0  (1.753 m), weight 260 lb 6.4 oz (118.117 kg), SpO2 94.00%. Temp:  [97.2 F (36.2 C)-97.5 F (36.4 C)] 97.2 F (36.2 C) (04/02 1443) Pulse Rate:  [69-80] 80 (04/02 1443) Resp:  [18] 18 (04/02 1443) BP: (118-128)/(66-85) 118/66 mmHg (04/02 1443) SpO2:  [91 %-94 %] 94 % (04/02 1443)  Physical Exam: General: Alert and awake,  oriented x3, not in any acute distress.  HEENT: anicteric sclera, pupils reactive to light and accommodation, EOMI, oropharynx clear and without exudate  CVS regular rate, normal r, no murmur rubs or gallops  Chest: clear to auscultation bilaterally, no wheezing, rales or rhonchi  Abdomen: soft nontender, nondistended, normal bowel sounds,  Impressive purpuric like rash with coalescence on ankles. See pictures  l         CBC:   Recent Labs  01/28/14 1346 01/29/14 0640 01/30/14 0850  WBC 9.7 16.6*  --   HGB 14.6 13.7  --   HCT 43.0 40.5  --   NA 139 134* 133*  K 4.6 5.1 4.6  CL 99 93* 91*  CO2 _1 BUN _2 CREATININE 0.72 0.72 0.89     BMET  Recent Labs  01/29/14 0640 01/30/14 0850  NA 134* 133*  K 5.1 4.6  CL 93* 91*  CO2 25 27  GLUCOSE 317* 181*  BUN 18 22  CREATININE 0.72 0.89  CALCIUM 9.2 9.3     Liver Panel   Recent Labs  01/28/14 1346  PROT 8.0  ALBUMIN 3.2*  AST 17  ALT 11  ALKPHOS 126*  BILITOT 0.4       Sedimentation Rate  Recent Labs  01/28/14 1346  ESRSEDRATE 50*   C-Reactive Protein  Recent Labs  01/28/14 1554  CRP 5.2*    Micro Results: Recent Results (from the past 240 hour(s))  CULTURE, BLOOD (ROUTINE X 2)     Status: None   Collection Time    01/28/14  3:55 PM      Result Value Ref Range Status   Specimen Description BLOOD RIGHT ANTECUBITAL   Final   Special Requests BOTTLES DRAWN AEROBIC AND ANAEROBIC 10MLS   Final   Culture  Setup Time     Final   Value: 01/28/2014 20:00     Performed at [facility]   Culture     Final   Value:        BLOOD CULTURE RECEIVED NO GROWTH TO DATE CULTURE WILL BE HELD FOR 5 DAYS BEFORE ISSUING A FINAL NEGATIVE REPORT     Performed at [facility]   Report Status PENDING   Incomplete  CULTURE, BLOOD (ROUTINE X 2)     Status: None   Collection Time    01/28/14  4:05 PM      Result Value Ref Range Status   Specimen Description BLOOD LEFT  ANTECUBITAL   Final   Special Requests BOTTLES DRAWN AEROBIC AND ANAEROBIC 10MLS   Final   Culture  Setup Time     Final   Value: 01/28/2014 20:00     Performed at [facility]   Culture     Final   Value:        BLOOD CULTURE RECEIVED NO GROWTH TO DATE CULTURE WILL BE HELD FOR 5 DAYS BEFORE ISSUING A FINAL NEGATIVE REPORT     Performed at [facility]   Report Status PENDING   Incomplete    Studies/Results: No results found.    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 female with  Progressive purpuric rash with vasculitis seen on biopsy  #1 Purpuric rash due to vasculitis: --followup labs, including ID and autoimmune, so far nothing remarkable other than high ESr and CRP --would give steroids --would check urine tox screen  #2 Contact; no need for contact precautions  I will sign off at this point  Please call with further questions   LOS: 2 days   Alcide Evener 01/30/2014, 5:29 PM

## 2014-01-30 NOTE — Progress Notes (Signed)
Notified Crosley, MD that pt having pain in left leg that is ineffective with Vicodin, not time for dilaudid. MD put in dilaudid 1mg  x1. Will continue to monitor pt. Ranelle Oyster, RN

## 2014-01-30 NOTE — Progress Notes (Signed)
  Subjective: Biopsy sites OK.  She has multiple complaints and Medicine is working on those with her.  Objective: Vital signs in last 24 hours: Temp:  [97.3 F (36.3 C)-98.4 F (36.9 C)] 97.3 F (36.3 C) (04/02 1039) Pulse Rate:  [69-88] 69 (04/02 0605) Resp:  [18] 18 (04/02 0605) BP: (118-128)/(79-85) 118/80 mmHg (04/02 0605) SpO2:  [91 %-92 %] 91 % (04/02 0605) Last BM Date: 01/28/14 Afebrile, VSS No labs Intake/Output from previous day: 04/01 0701 - 04/02 0700 In: 240 [P.O.:240] Out: 1000 [Urine:1000] Intake/Output this shift:   Alert cooperative talking with Dr. Hartford Poli, says skin lesions are painful.   Skin: Skin color, texture, turgor normal. No rashes or lesions or bx sites OK  Lab Results:   Recent Labs  01/28/14 1346 01/29/14 0640  WBC 9.7 16.6*  HGB 14.6 13.7  HCT 43.0 40.5  PLT 342 402*    BMET  Recent Labs  01/28/14 1346 01/29/14 0640  NA 139 134*  K 4.6 5.1  CL 99 93*  CO2 28 25  GLUCOSE 115* 317*  BUN 10 18  CREATININE 0.72 0.72  CALCIUM 9.7 9.2   PT/INR  Recent Labs  01/28/14 1454  LABPROT 12.5  INR 0.95     Recent Labs Lab 01/28/14 1346  AST 17  ALT 11  ALKPHOS 126*  BILITOT 0.4  PROT 8.0  ALBUMIN 3.2*     Lipase  No results found for this basename: lipase     Studies/Results: No results found.  Medications: . baclofen  20 mg Oral BID  . cholecalciferol  2,000 Units Oral Daily  . estradiol  1 mg Oral Daily  . gabapentin  800 mg Oral TID WC & HS  . heparin  5,000 Units Subcutaneous 3 times per day  . insulin aspart  0-9 Units Subcutaneous TID WC  . insulin glargine  45 Units Subcutaneous QHS  . lamoTRIgine  25 mg Oral BID  . levothyroxine  75 mcg Oral Daily  . lidocaine (PF)  5 mL Intradermal Once  . vitamin B-12  1,000 mcg Oral Daily    Assessment/Plan 1. Rash, presumed some type of vasculitis, s/p punch biopsy with 4 mm punch bx yesterday, 2 sties.  Plan:  You can place band aides over the sites, clean  with soap and water.  Call if we can be of further assistance.       LOS: 2 days    Katherine Herrera 01/30/2014

## 2014-01-31 DIAGNOSIS — M2550 Pain in unspecified joint: Secondary | ICD-10-CM

## 2014-01-31 DIAGNOSIS — L988 Other specified disorders of the skin and subcutaneous tissue: Secondary | ICD-10-CM

## 2014-01-31 LAB — URINE CULTURE: Colony Count: 2000

## 2014-01-31 LAB — MPO/PR-3 (ANCA) ANTIBODIES
Myeloperoxidase Abs: <1
Serine Protease 3: <1

## 2014-01-31 LAB — SJOGRENS SYNDROME-B EXTRACTABLE NUCLEAR ANTIBODY: SSB (La) (ENA) Antibody, IgG: <1

## 2014-01-31 LAB — HIV-1 RNA QUANT-NO REFLEX-BLD
HIV 1 RNA Quant: <20 copies/mL (ref <20)
HIV-1 RNA Quant, Log: <1.3 {Log} (ref <1.30)

## 2014-01-31 LAB — GLUCOSE, CAPILLARY: Glucose-Capillary: 143 mg/dL — ABNORMAL HIGH (ref 70–99)

## 2014-01-31 LAB — SJOGRENS SYNDROME-A EXTRACTABLE NUCLEAR ANTIBODY: SSA (Ro) (ENA) Antibody, IgG: <1

## 2014-01-31 MED ORDER — OXYCODONE-ACETAMINOPHEN 10-325 MG PO TABS: 1.0000 | ORAL_TABLET | ORAL | Status: DC | PRN

## 2014-01-31 MED ORDER — INSULIN ASPART 100 UNIT/ML ~~LOC~~ SOLN: 0.0000 [IU] | Freq: Three times a day (TID) | SUBCUTANEOUS | Status: DC

## 2014-01-31 MED ORDER — FLUCONAZOLE 100 MG PO TABS: 100.0000 mg | ORAL_TABLET | Freq: Every day | ORAL | Status: DC

## 2014-01-31 MED ORDER — DIPHENHYDRAMINE HCL 25 MG PO CAPS: 25.0000 mg | ORAL_CAPSULE | Freq: Four times a day (QID) | ORAL | Status: DC | PRN

## 2014-01-31 MED ORDER — INSULIN GLARGINE 100 UNIT/ML ~~LOC~~ SOLN: 50.0000 [IU] | Freq: Every day | SUBCUTANEOUS | Status: DC

## 2014-01-31 MED ORDER — INSULIN ASPART 100 UNIT/ML ~~LOC~~ SOLN: 10.0000 [IU] | Freq: Three times a day (TID) | SUBCUTANEOUS | Status: DC

## 2014-01-31 MED ORDER — GABAPENTIN 800 MG PO TABS: 800.0000 mg | ORAL_TABLET | Freq: Four times a day (QID) | ORAL | Status: DC

## 2014-01-31 MED ORDER — PREDNISONE 10 MG PO TABS: 40.0000 mg | ORAL_TABLET | Freq: Every day | ORAL | Status: DC

## 2014-01-31 NOTE — Progress Notes (Signed)
Patient discharged to home.  Patient alert, oriented, verbally responsive, breathing regular and non-labored throughout, no s/s of distress noted throughout, no c/o pain throughout, discharge instructions thoroughly verbalized to patient and husband at bedside.  Both verbalized understanding throughout.  Patient left unit per wheelchair accompanied by transportation and husband.  Belongings with patient.  VS WNL.  Dirk Dress 01/31/2014

## 2014-01-31 NOTE — Discharge Summary (Addendum)
Addendum  Patient seen and examined, chart and data base reviewed.  I agree with the above assessment and plan.  For full details please see Mrs. Imogene Burn PA note.  Cutaneous vasculitis, discharged on prednisone to follow up with dermatology as outpatient.  Diabetic regimen changed to Lantus 50 units at night, insulin sliding scale, instructed to take the blood sugar log to her primary care doctor to figure out NovoLog dose for meals likely somewhere between 12 and 20 units.   Birdie Hopes, MD [facility] Pager: [phone] 01/31/2014, 4:58 PM

## 2014-01-31 NOTE — Discharge Summary (Signed)
Physician Discharge Summary  Katherine Herrera IEP:[id number] DOB: 05-Mar-1966 DOA: 01/28/2014  PCP: Walker Kehr, MD  Admit date: 01/28/2014 Discharge date: 01/31/2014  Time spent: 50 minutes  Recommendations for Outpatient Follow-up:  1. Follow up with Dr. Onalee Hua office on 4/21.  Earlier if at all possible. 2. Needs on-going DM management. 3. Currently on prednisone for vasculitic rash.  Will need to be adjusted in next 2 weeks.   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 Patient reports no fevers, congestion, cough or any upper respiratory symptoms or GI symptoms. The patient went to her PCP and was prescribed doxycycline for possible cellulitis unfortunately with no improvement. Patient denies any change in her medications or any recent new medications.   Hospital Course:   Rash/Dermal vasculitis  -Petechial, non-tender non-blanchable.  -Appears to be vasculitic rash, ESR and CRP elevated.  -Has negative ANCA screen and a negative ANA. Rheumatoid factor is 14.  -Skin biopsy was done by general surgery, seen by ID.  -Hep C antibodies negative, HIV negative.  -Skin biopsy showed superficial and mid dermal vessel vasculitis. No malignancy identified.  -The rash has improved over the last 48 hours.  It is nearly resolved on her abdomen and lighter on her lower extremities.  Type 2 diabetes mellitus  -Patient reported peripheral sensory neuropathy.  -Uncontrolled diabetes mellitus with hemoglobin A1c of 7.7.  -Will switch the insulin to Lantus and NovoLog, benefits  check shows that it should be no more than she is currently paying. -Will d/c on Lantus 50 units at night, and novolog for meal coverage and sliding scale.   Hypothyroidism  -Continue home medications,  TSH currently 0.546.  Foot pain / Rash pain -Patient is on Vicodin at home, She also on Neurontin.  -Patient still complaining about burning pain in the foot and asking to increase narcotics.  -Neurontin dose increased as the pain is burning and seems neuropathic.   -Will d/c on percocet rather than vicodin.  Procedures:  Punch biopsy x 2 on 4/1 by general surgery  Consultations:  Infectious Disease  Surgery  Discharge Exam: Filed Vitals:   01/31/14 0630  BP: 113/66  Pulse: 66  Temp: 97.5 F (36.4 C)  Resp: 18   Filed Weights   01/28/14 1203 01/28/14 1848  Weight: 121.836 kg (268 lb 9.6 oz) 118.117 kg (260 lb 6.4 oz)    General: Alert and awake, oriented x3, not in any acute distress.  HEENT: anicteric sclera, pupils reactive to light and accommodation, EOMI  CVS: S1-S2 clear, no murmur rubs or gallops  Chest: clear to auscultation bilaterally, no wheezing, rales or rhonchi  Abdomen: soft nontender, nondistended, normal bowel sounds, no organomegaly  Extremities: no cyanosis, clubbing or edema noted bilaterally  Neuro: Cranial nerves II-XII intact, no focal neurological deficits Skin:  Purpuric rash, with some scabbing primarily on the distal lower extremities bilaterally, small amount on lower central abdomen.  Rash is lighter in color 4/3 compared to 4/2.        Discharge Orders   Future Appointments Provider Department Dept Phone   02/11/2014 2:45 PM Cassandria Anger, MD [facility] [phone]   03/17/2014 1:30 PM Renato Shin, MD [facility] Endocrinology [phone]   03/26/2014 1:30 PM [place], [place] [phone]   04/04/2014 1:15 PM Cassandria Anger, MD [place]  [phone]   Future Orders Complete By Expires   Diet - low sodium heart healthy  As directed    Increase activity slowly  As directed        Medication List    STOP taking these medications       doxycycline 100 MG tablet  Commonly known as:  VIBRA-TABS     HYDROcodone-acetaminophen 10-325 MG per tablet  Commonly known as:  NORCO     insulin NPH Human 100 UNIT/ML injection  Commonly known as:  HUMULIN N,NOVOLIN N     insulin regular 100 units/mL injection  Commonly known as:  NOVOLIN R,HUMULIN R      TAKE these medications       ACCU-CHEK AVIVA PLUS W/DEVICE Kit  USE AS DIRECTED     ACCU-CHEK SOFTCLIX LANCETS lancets     B-D INS SYRINGE 0.5CC/31GX5/16 31G X 5/16" 0.5 ML Misc  Generic drug:  Insulin Syringe-Needle U-100     baclofen 10 MG tablet  Commonly known as:  LIORESAL  Take 2 tablets (20 mg total) by mouth 2 (two) times daily.     BD PEN NEEDLE NANO U/F 32G X 4 MM Misc  Generic drug:  Insulin Pen Needle     cholecalciferol 1000 UNITS tablet  Commonly known as:  VITAMIN D  Take 2,000 Units by mouth daily.     clonazePAM 0.5 MG tablet  Commonly known as:  KLONOPIN  TAKE ONE TABLET BY MOUTH 2 TIMES A DAY AS NEEDED FOR ANXIETY     diphenhydrAMINE 25 mg capsule  Commonly known as:  BENADRYL  Take 1 capsule (25 mg total) by mouth every 6 (six) hours as needed for itching or sleep.     EASY TOUCH LANCING DEVICE Misc     estradiol 1 MG tablet  Commonly known as:  ESTRACE  Take 1 mg by mouth daily.     fluconazole 100 MG tablet  Commonly known as:  DIFLUCAN  Take 1 tablet (100 mg total) by mouth daily.     gabapentin 800 MG tablet  Commonly known as:  NEURONTIN  Take 1 tablet (800 mg total) by mouth 4 (four) times daily.     glucose blood test strip  Commonly known as:  ACCU-CHEK AVIVA PLUS  As directed     insulin aspart 100 UNIT/ML injection  Commonly known as:  novoLOG  - Inject 0-15 Units into the skin 3 (three) times daily with meals. CBG  70 - 120: 0 units  - CBG 121 - 150: 2 units  - CBG 151 - 200: 3 units  - CBG 201 - 250: 5 units  - CBG 251 - 300: 8 units  - CBG 301 - 350: 11 units  - CBG 351 - 400: 15 units     insulin aspart 100 UNIT/ML injection  Commonly known as:  novoLOG  Inject 10 Units into the skin 3 (three) times daily with meals.     insulin glargine 100 UNIT/ML injection  Commonly known as:  LANTUS  Inject 0.5 mLs (50 Units total) into the skin at bedtime.     lamoTRIgine 25 MG tablet  Commonly known as:  LAMICTAL  Take 1 tablet (25 mg total) by mouth 2 (two)  times daily.     levothyroxine 75 MCG tablet  Commonly known as:  SYNTHROID, LEVOTHROID  Take 1 tablet (75 mcg total) by mouth daily.     oxyCODONE-acetaminophen 10-325 MG per tablet  Commonly known as:  PERCOCET  Take 1 tablet by mouth every 4 (four) hours as needed for pain.     predniSONE 10 MG tablet  Commonly known as:  DELTASONE  Take 4 tablets (40 mg total) by mouth daily with breakfast. Take 4 tablets daily with breakfast for 1 week.  Then 2 tabs daily with breakfast for 1 week.  Then stop.     PRESCRIPTION MEDICATION  - Apply 1-2 g topically See admin instructions. Apply 1-2 grams (1-2 ML) to affected area 2 to 3 times a day  - Diclofenac/Baclofen/Bupivacane     rosuvastatin 10 MG tablet  Commonly known as:  CRESTOR  Take 10 mg by mouth See admin instructions. Take 1 tablet every 3 days     traMADol 50 MG tablet  Commonly known as:  ULTRAM  Take 1-2 tablets (50-100 mg total) by mouth 2 (two) times daily as needed (headache).     triamcinolone ointment 0.5 %  Commonly known as:  KENALOG  Apply 1 application topically 2 (two) times daily.     vitamin B-12 1000 MCG tablet  Commonly known as:  CYANOCOBALAMIN  Take 1,000 mcg by mouth daily.     zolpidem 10 MG tablet  Commonly known as:  AMBIEN  Take 0.5-1 tablets (5-10 mg total) by mouth at bedtime as needed. For insomnia       Allergies  Allergen Reactions  .  Crestor [Rosuvastatin Calcium]     myalgia  . Lovastatin Other (See Comments)    Unknown   . Metformin Nausea And Vomiting  . Niacin Other (See Comments)    Unknown   . Paroxetine Other (See Comments)    Unknown   . Meperidine Hcl Rash   Follow-up Information   Follow up with Walker Kehr, MD In 1 week.   Specialty:  Internal Medicine   Contact information:   [address]  [place] [address] [phone]       Please follow up. (Needs Dermatology or Rheumatology follow up for Vasculitis.)        The results of significant diagnostics from this hospitalization (including imaging, microbiology, ancillary and laboratory) are listed below for reference.    Significant Diagnostic Studies: Surgical pathology:  Diagnosis Skin , Left medial calf - SUPERFICIAL AND MID DERMAL VESSEL VASCULITIS. - NO DYSPLASIA, ATYPIA OR MALIGNANCY IDENTIFIED. - SEE COMMENT. Microscopic Comment The differential diagnosis with this histologic picture includes leukocytoclastic vasculitis versus polyarteritis nodosa (either primary skin or systemic).  Microbiology: Recent Results (from the past 240 hour(s))  CULTURE, BLOOD (ROUTINE X 2)     Status: None   Collection Time    01/28/14  3:55 PM      Result Value Ref Range Status   Specimen Description BLOOD RIGHT ANTECUBITAL   Final   Special Requests BOTTLES DRAWN AEROBIC AND ANAEROBIC 10MLS   Final   Culture  Setup Time     Final   Value: 01/28/2014 20:00     Performed at [facility]   Culture     Final   Value:        BLOOD CULTURE RECEIVED NO GROWTH TO DATE CULTURE WILL BE HELD FOR 5 DAYS BEFORE ISSUING A FINAL NEGATIVE REPORT  Performed at [facility]   Report Status PENDING   Incomplete  CULTURE, BLOOD (ROUTINE X 2)     Status: None   Collection Time    01/28/14  4:05 PM      Result Value Ref Range Status   Specimen Description BLOOD LEFT ANTECUBITAL   Final   Special Requests  BOTTLES DRAWN AEROBIC AND ANAEROBIC 10MLS   Final   Culture  Setup Time     Final   Value: 01/28/2014 20:00     Performed at [facility]   Culture     Final   Value:        BLOOD CULTURE RECEIVED NO GROWTH TO DATE CULTURE WILL BE HELD FOR 5 DAYS BEFORE ISSUING A FINAL NEGATIVE REPORT     Performed at [facility]   Report Status PENDING   Incomplete  URINE CULTURE     Status: None   Collection Time    01/29/14 10:07 PM      Result Value Ref Range Status   Specimen Description URINE, RANDOM   Final   Special Requests NONE   Final   Culture  Setup Time     Final   Value: 01/30/2014 01:00     Performed at [facility] Count     Final   Value: 2,000 COLONIES/ML     Performed at [facility]   Culture     Final   Value: INSIGNIFICANT GROWTH     Performed at [facility]   Report Status 01/31/2014 FINAL   Final     Labs: Basic Metabolic Panel:  Recent Labs Lab 01/28/14 1346 01/29/14 0640 01/30/14 0850  NA 139 134* 133*  K 4.6 5.1 4.6  CL 99 93* 91*  CO2 _0 GLUCOSE 115* 317* 181*  BUN _1 CREATININE 0.72 0.72 0.89  CALCIUM 9.7 9.2 9.3   Liver Function Tests:  Recent Labs Lab 01/28/14 1346  AST 17  ALT 11  ALKPHOS 126*  BILITOT 0.4  PROT 8.0  ALBUMIN 3.2*   CBC:  Recent Labs Lab 01/28/14 1346 01/29/14 0640  WBC 9.7 16.6*  NEUTROABS 6.4  --   HGB 14.6 13.7  HCT 43.0 40.5  MCV 89.6 89.8  PLT 342 402*   CBG:  Recent Labs Lab 01/30/14 0744 01/30/14 1212 01/30/14 1729 01/30/14 2134 01/31/14 0732  GLUCAP 201* 254* 203* 282* 143*    Impressive purpuric like rash with coalescence on ankles. See pictures below         Signed:  Karen Kitchens [phone] [facility] 01/31/2014, 3:20 PM

## 2014-01-31 NOTE — ED Provider Notes (Signed)
Medical screening examination/treatment/procedure(s) were performed by non-physician practitioner and as supervising physician I was immediately available for consultation/collaboration.   Leota Jacobsen, MD 01/31/14 [phone]

## 2014-02-01 LAB — DRUGS OF ABUSE SCREEN W/O ALC, ROUTINE URINE
Amphetamine Screen, Ur: NEGATIVE
Barbiturate Quant, Ur: NEGATIVE
Benzodiazepines.: NEGATIVE
Cocaine Metabolites: NEGATIVE
Creatinine,U: 116.6 mg/dL
Marijuana Metabolite: NEGATIVE
Methadone: NEGATIVE
Opiate Screen, Urine: POSITIVE — AB
Phencyclidine (PCP): NEGATIVE
Propoxyphene: NEGATIVE

## 2014-02-02 ENCOUNTER — Encounter: Payer: Self-pay | Admitting: Internal Medicine

## 2014-02-03 LAB — CULTURE, BLOOD (ROUTINE X 2)
Culture: NO GROWTH
Culture: NO GROWTH

## 2014-02-04 LAB — OPIATE, QUANTITATIVE, URINE
6 Monoacetylmorphine, Ur-Confirm: NEGATIVE ng/mL
Codeine Urine: NEGATIVE ng/mL
Hydrocodone: 3631 ng/mL
Hydromorphone GC/MS Conf: 446 ng/mL
Morphine, Confirm: NEGATIVE ng/mL
Norhydrocodone, Ur: 1522 ng/mL
Noroxycodone, Ur: NEGATIVE ng/mL
Oxycodone, ur: NEGATIVE ng/mL
Oxymorphone: NEGATIVE ng/mL

## 2014-02-11 ENCOUNTER — Ambulatory Visit: Payer: Medicare PPO | Admitting: Internal Medicine

## 2014-03-17 ENCOUNTER — Ambulatory Visit: Payer: Medicare PPO | Admitting: Endocrinology

## 2014-03-18 ENCOUNTER — Encounter: Payer: Self-pay | Admitting: Endocrinology

## 2014-03-18 ENCOUNTER — Ambulatory Visit (INDEPENDENT_AMBULATORY_CARE_PROVIDER_SITE_OTHER): Payer: Medicare PPO | Admitting: Endocrinology

## 2014-03-18 VITALS — BP 126/76 | HR 111 | Temp 97.9°F | Ht 69.0 in | Wt 260.0 lb

## 2014-03-18 DIAGNOSIS — E1149 Type 2 diabetes mellitus with other diabetic neurological complication: Secondary | ICD-10-CM

## 2014-03-18 DIAGNOSIS — E1140 Type 2 diabetes mellitus with diabetic neuropathy, unspecified: Secondary | ICD-10-CM

## 2014-03-18 DIAGNOSIS — E1142 Type 2 diabetes mellitus with diabetic polyneuropathy: Secondary | ICD-10-CM

## 2014-03-18 MED ORDER — INSULIN NPH (HUMAN) (ISOPHANE) 100 UNIT/ML ~~LOC~~ SUSP: 35.0000 [IU] | Freq: Every day | SUBCUTANEOUS | Status: DC

## 2014-03-18 MED ORDER — INSULIN REGULAR HUMAN 100 UNIT/ML IJ SOLN: INTRAMUSCULAR | Status: DC

## 2014-03-18 NOTE — Progress Notes (Signed)
Subjective:    Patient ID: Katherine Herrera, female    DOB: [birth year], 48 y.o.   MRN: [id number]  HPI pt returns for f/u of type 2 DM (dx'ed 2006, not in the context of pancreatitis; she has moderate sensory neuropathy of the lower extremities, but no associated complications; her last episode of severe hypoglycemia was in late 2013; she has never had DKA; she takes multiple daily injections; she takes human insulin, for cost reasons).  She was recently in the hospital.  She was changed to lantus 50 units qhs, and prn novolog (she averages approx 35 units total per day).  no cbg record, but states cbg's vary from 92-210.  She says she takes the insulin as rx'ed, but it is too expensive.   Past Medical History  Diagnosis Date  . Syncope     poss conversion disorder vs psychogenic seizures  . DM (diabetes mellitus)   . Hypothyroidism   . LBP (low back pain)     DR Patrice Paradise  . Peripheral neuropathy   . COPD (chronic obstructive pulmonary disease)   . Vitamin D deficiency   . Migraine   . Hyperlipidemia   . Anxiety   . Gout     possible  . Tachycardia     diet coke overuse?  . TTS (tarsal tunnel syndrome) 2010    Left Foot [facility] in [place]    Past Surgical History  Procedure Laterality Date  . Cervical fusion      C4-5  . Abdominal hysterectomy    . Carpal tunnel release  2010    LEFT    History   Social History  . Marital Status: Married    Spouse Name: N/A    Number of Children: N/A  . Years of Education: N/A   Occupational History  . Not on file.   Social History Main Topics  . Smoking status: Former Smoker    Quit date: 12/01/2009  . Smokeless tobacco: Never Used  . Alcohol Use: No  . Drug Use: No  . Sexual Activity: Yes   Other Topics Concern  . Not on file   Social History Narrative   GYN - Dr Radene Knee      FAMILY HISTORY   Lung cancer   Mother w/alcoholism      Married 3d GS due Nov   Diet Coke: 10 cans a day      Regular exercise - NO      Former  smoker 2009 restared 2010 stopped 12/2009    Current Outpatient Prescriptions on File Prior to Visit  Medication Sig Dispense Refill  . ACCU-CHEK SOFTCLIX LANCETS lancets       . B-D INS SYRINGE 0.5CC/31GX5/16 31G X 5/16" 0.5 ML MISC       . baclofen (LIORESAL) 10 MG tablet Take 2 tablets (20 mg total) by mouth 2 (two) times daily.  360 each  1  . BD PEN NEEDLE NANO U/F 32G X 4 MM MISC       . Blood Glucose Monitoring Suppl (ACCU-CHEK AVIVA PLUS) W/DEVICE KIT USE AS DIRECTED  1 kit  0  . Cholecalciferol (VITAMIN D3) 1000 UNIT tablet Take 2,000 Units by mouth daily.        . clonazePAM (KLONOPIN) 0.5 MG tablet TAKE ONE TABLET BY MOUTH 2 TIMES A DAY AS NEEDED FOR ANXIETY      . diphenhydrAMINE (BENADRYL) 25 mg capsule Take 1 capsule (25 mg total) by mouth every 6 (six) hours as needed  for itching or sleep.  30 capsule  0  . estradiol (ESTRACE) 1 MG tablet Take 1 mg by mouth daily.        . fluconazole (DIFLUCAN) 100 MG tablet Take 1 tablet (100 mg total) by mouth daily.  5 tablet  0  . gabapentin (NEURONTIN) 800 MG tablet Take 1 tablet (800 mg total) by mouth 4 (four) times daily.  120 tablet  1  . glucose blood (ACCU-CHEK AVIVA PLUS) test strip As directed  100 each  11  . lamoTRIgine (LAMICTAL) 25 MG tablet Take 1 tablet (25 mg total) by mouth 2 (two) times daily.  180 tablet  1  . Lancet Devices (EASY TOUCH LANCING DEVICE) MISC       . levothyroxine (SYNTHROID, LEVOTHROID) 75 MCG tablet Take 1 tablet (75 mcg total) by mouth daily.  90 tablet  3  . oxyCODONE-acetaminophen (PERCOCET) 10-325 MG per tablet Take 1 tablet by mouth every 4 (four) hours as needed for pain.  30 tablet  0  . predniSONE (DELTASONE) 10 MG tablet Take 4 tablets (40 mg total) by mouth daily with breakfast. Take 4 tablets daily with breakfast for 1 week.  Then 2 tabs daily with breakfast for 1 week.  Then stop.  42 tablet  0  . PRESCRIPTION MEDICATION Apply 1-2 g topically See admin instructions. Apply 1-2 grams (1-2 ML) to  affected area 2 to 3 times a day Diclofenac/Baclofen/Bupivacane      . rosuvastatin (CRESTOR) 10 MG tablet Take 10 mg by mouth See admin instructions. Take 1 tablet every 3 days      . traMADol (ULTRAM) 50 MG tablet Take 1-2 tablets (50-100 mg total) by mouth 2 (two) times daily as needed (headache).  180 tablet  0  . triamcinolone ointment (KENALOG) 0.5 % Apply 1 application topically 2 (two) times daily.  90 g  1  . vitamin B-12 (CYANOCOBALAMIN) 1000 MCG tablet Take 1,000 mcg by mouth daily.        Marland Kitchen zolpidem (AMBIEN) 10 MG tablet Take 0.5-1 tablets (5-10 mg total) by mouth at bedtime as needed. For insomnia  90 tablet  1   No current facility-administered medications on file prior to visit.    Allergies  Allergen Reactions  . Crestor [Rosuvastatin Calcium]     myalgia  . Lovastatin Other (See Comments)    Unknown   . Metformin Nausea And Vomiting  . Niacin Other (See Comments)    Unknown   . Paroxetine Other (See Comments)    Unknown   . Meperidine Hcl Rash    Family History  Problem Relation Age of Onset  . Diabetes Mother   . Hypertension Mother   . Cancer Father 70    ? bone cancer    BP 126/76  Pulse 111  Temp(Src) 97.9 F (36.6 C) (Oral)  Ht '5\' 9"'  (1.753 m)  Wt 260 lb (117.935 kg)  BMI 38.38 kg/m2  SpO2 93%  Review of Systems She denies hypoglycemia and weight change.      Objective:   Physical Exam VITAL SIGNS:  See vs page GENERAL: no distress Pulses: dorsalis pedis intact bilat.   Feet: no deformity. normal color and temp.  no edema. Skin:  Multiple ulcers at the left leg and foot (sees derm).  Healed skin graft site at the dorsal aspect of the right foot.   Neuro: sensation is intact to touch on the feet.    Lab Results  Component Value Date  HGBA1C 7.8* 01/28/2014      Assessment & Plan:  DM: i advised pt of risk from this insulin regimen.  She agrees to change back. Noncompliance with cbg recording: I'll work around this as best I  can   Patient Instructions  check your blood sugar twice a day.  vary the time of day when you check, between before the 3 meals, and at bedtime.  also check if you have symptoms of your blood sugar being too high or too low.  please keep a record of the readings and bring it to your next appointment here.  please call us sooner if your blood sugar goes below 70, or if it stays over 200.   please come back for a follow-up appointment in 2 months.  Please resume the NPH insulin at 35 units at bedtime. Also, please resume the regular insulin to 3x a day (just before each meal), 40-20-50.

## 2014-03-18 NOTE — Patient Instructions (Addendum)
check your blood sugar twice a day.  vary the time of day when you check, between before the 3 meals, and at bedtime.  also check if you have symptoms of your blood sugar being too high or too low.  please keep a record of the readings and bring it to your next appointment here.  please call us sooner if your blood sugar goes below 70, or if it stays over 200.   please come back for a follow-up appointment in 2 months.  Please resume the NPH insulin at 35 units at bedtime. Also, please resume the regular insulin to 3x a day (just before each meal), 40-20-50.

## 2014-03-25 ENCOUNTER — Ambulatory Visit (INDEPENDENT_AMBULATORY_CARE_PROVIDER_SITE_OTHER): Payer: Medicare PPO | Admitting: Podiatry

## 2014-03-25 ENCOUNTER — Encounter: Payer: Self-pay | Admitting: Podiatry

## 2014-03-25 VITALS — BP 139/88 | HR 100 | Ht 69.0 in | Wt 260.0 lb

## 2014-03-25 DIAGNOSIS — L84 Corns and callosities: Secondary | ICD-10-CM | POA: Insufficient documentation

## 2014-03-25 DIAGNOSIS — E1149 Type 2 diabetes mellitus with other diabetic neurological complication: Secondary | ICD-10-CM

## 2014-03-25 DIAGNOSIS — R21 Rash and other nonspecific skin eruption: Secondary | ICD-10-CM

## 2014-03-25 DIAGNOSIS — M79609 Pain in unspecified limb: Secondary | ICD-10-CM

## 2014-03-25 NOTE — Progress Notes (Signed)
48 year old female presents with dark hard scabbed left lower limb and stating that she has diabetic ulcers. This problem started about 3 months ago and under doctor's care.  Stated that she was told diabetic vasculitis.  Her blood sugar runs from 175 to 235.  Requesting calluses trimmed on right foot.   Objective: Dark brown hard scabbed skin on left lower limb sizing from 1cm to 3cm in diameter, mostly dry.  Plantar callus under 5th MPJ right foot.  Edema and erythema left foot and leg.   Assessment: Diabetes uncontrolled. Vasculitis and edema left lower limb. Painful callus right foot.  Plan: Painful calluses debrided. Advised to wear better shoe gear.  Return as needed.

## 2014-03-25 NOTE — Patient Instructions (Signed)
Seen for painful callus. All debrided.

## 2014-03-26 ENCOUNTER — Ambulatory Visit: Payer: Medicare PPO | Admitting: Podiatry

## 2014-03-28 ENCOUNTER — Other Ambulatory Visit: Payer: Self-pay | Admitting: Internal Medicine

## 2014-03-31 ENCOUNTER — Ambulatory Visit (INDEPENDENT_AMBULATORY_CARE_PROVIDER_SITE_OTHER): Payer: Medicare PPO | Admitting: Internal Medicine

## 2014-03-31 ENCOUNTER — Encounter: Payer: Self-pay | Admitting: Internal Medicine

## 2014-03-31 VITALS — BP 98/64 | HR 68 | Temp 97.5°F | Resp 16 | Wt 266.0 lb

## 2014-03-31 DIAGNOSIS — E1149 Type 2 diabetes mellitus with other diabetic neurological complication: Secondary | ICD-10-CM

## 2014-03-31 DIAGNOSIS — M79609 Pain in unspecified limb: Secondary | ICD-10-CM

## 2014-03-31 DIAGNOSIS — M79605 Pain in left leg: Secondary | ICD-10-CM | POA: Insufficient documentation

## 2014-03-31 DIAGNOSIS — E1142 Type 2 diabetes mellitus with diabetic polyneuropathy: Secondary | ICD-10-CM

## 2014-03-31 DIAGNOSIS — M545 Low back pain, unspecified: Secondary | ICD-10-CM

## 2014-03-31 DIAGNOSIS — G609 Hereditary and idiopathic neuropathy, unspecified: Secondary | ICD-10-CM

## 2014-03-31 DIAGNOSIS — R21 Rash and other nonspecific skin eruption: Secondary | ICD-10-CM

## 2014-03-31 DIAGNOSIS — E1140 Type 2 diabetes mellitus with diabetic neuropathy, unspecified: Secondary | ICD-10-CM

## 2014-03-31 MED ORDER — HYDROCODONE BITARTRATE ER 40 MG PO T24A: 40.0000 mg | EXTENDED_RELEASE_TABLET | Freq: Every day | ORAL | Status: DC

## 2014-03-31 NOTE — Assessment & Plan Note (Signed)
Continue with current prescription therapy as reflected on the Med list.  

## 2014-03-31 NOTE — Progress Notes (Signed)
Subjective:    Rash This is a chronic problem. The current episode started more than 1 month ago. The problem is unchanged. The affected locations include the right ankle, left lower leg, right lower leg and left ankle. The rash is characterized by blistering, burning, pain, redness and swelling. She was exposed to nothing. Pertinent negatives include no congestion, cough, fatigue or shortness of breath. Past treatments include analgesics and anti-itch cream. The treatment provided no relief. There is no history of eczema.    C/o painful burning rash on B LEs x 3-4 mo; L>R. ?diabetic vasculitis per [facility] in [place]; s/p bx. C/o pain 10/10 at night. Hydrocodone 10/325 is helping very little (6/day).  F/u leg cramps F/u LBP - worse - she slipped and almost fell last week  The patient presents for a follow-up of  chronic hypertension, chronic dyslipidemia, type 2 diabetes and FMS and LE neuropathy - asking to increase Gabapentin CBGs are high: work in progress w/Dr Loanne Drilling - better Vicodin is helping w/LBP Tramadol is helping w/HA - not using together F/u L foot pain F/u anxiety, mood swing  Wt Readings from Last 3 Encounters:  03/31/14 266 lb (120.657 kg)  03/25/14 260 lb (117.935 kg)  03/18/14 260 lb (117.935 kg)   BP Readings from Last 3 Encounters:  03/31/14 98/64  03/25/14 139/88  03/18/14 126/76      Review of Systems  Constitutional: Negative for chills, activity change, appetite change, fatigue and unexpected weight change.  HENT: Negative for congestion, mouth sores and sinus pressure.   Eyes: Negative for visual disturbance.  Respiratory: Negative for cough, chest tightness and shortness of breath.   Gastrointestinal: Negative for nausea and abdominal pain.  Genitourinary: Negative for frequency, difficulty urinating and vaginal pain.  Musculoskeletal: Positive for arthralgias. Negative for back pain and gait problem.  Skin: Negative for pallor  and rash.  Neurological: Negative for dizziness, tremors, weakness, numbness and headaches.  Psychiatric/Behavioral: Negative for suicidal ideas, confusion and sleep disturbance. The patient is nervous/anxious.        Objective:   Physical Exam  Constitutional: She appears well-developed. No distress.  HENT:  Head: Normocephalic.  Right Ear: External ear normal.  Left Ear: External ear normal.  Nose: Nose normal.  Mouth/Throat: Oropharynx is clear and moist.  Eyes: Conjunctivae are normal. Pupils are equal, round, and reactive to light. Right eye exhibits no discharge. Left eye exhibits no discharge.  Neck: Normal range of motion. Neck supple. No JVD present. No tracheal deviation present. No thyromegaly present.  Cardiovascular: Normal rate, regular rhythm and normal heart sounds.   Pulmonary/Chest: No stridor. No respiratory distress. She has no wheezes.  Abdominal: Soft. Bowel sounds are normal. She exhibits no distension and no mass. There is no tenderness. There is no rebound and no guarding.  Musculoskeletal: She exhibits tenderness (LS spine is tender, leg muscles hurt). She exhibits no edema.  Lymphadenopathy:    She has no cervical adenopathy.  Neurological: She displays normal reflexes. No cranial nerve deficit. She exhibits normal muscle tone. Coordination normal.  Skin: No rash noted. No erythema.  Psychiatric: She has a normal mood and affect. Her behavior is normal. Judgment and thought content normal.  Ectima type necrotic rash on LLE, mostly foot and ankle - several lesions measuring from 1 to 4 cm in diameter, two large confluent lesions on dorsal foot   RLE 0.2-1 cm scars each healed Trace edema   Lab Results  Component Value Date   WBC  16.6* 01/29/2014   HGB 13.7 01/29/2014   HCT 40.5 01/29/2014   PLT 402* 01/29/2014   GLUCOSE 181* 01/30/2014   CHOL 142 01/02/2013   TRIG 144.0 01/02/2013   HDL 32.20* 01/02/2013   LDLDIRECT 160.5 03/19/2012   LDLCALC 81 01/02/2013   ALT 11  01/28/2014   AST 17 01/28/2014   NA 133* 01/30/2014   K 4.6 01/30/2014   CL 91* 01/30/2014   CREATININE 0.89 01/30/2014   BUN 22 01/30/2014   CO2 27 01/30/2014   TSH 0.546 01/29/2014   INR 0.95 01/28/2014   HGBA1C 7.8* 01/28/2014   MICROALBUR 0.50 01/13/2014    A complex case - records reviewed    Assessment & Plan:

## 2014-03-31 NOTE — Assessment & Plan Note (Signed)
6/15 - painful burning rash on B LEs x 3-4 mo; L>R. ?diabetic vasculitis per [facility] in [place]; s/p bx. C/o pain 10/10 at night. Hydrocodone is helping very little (6/day). On Dapsone

## 2014-03-31 NOTE — Patient Instructions (Signed)
Gluten free trial (no wheat products) for 4-6 weeks. OK to use gluten-free bread and gluten-free pasta.  Milk free trial (no milk, ice cream, cheese and yogurt) for 4-6 weeks. OK to use almond, coconut, rice or soy milk. "Almond breeze" brand tastes good.  

## 2014-03-31 NOTE — Assessment & Plan Note (Signed)
6/15 - painful burning rash on B LEs x 3-4 mo; L>R. ?diabetic vasculitis per [facility] in [place]; s/p bx. C/o pain 10/10 at night. Hydrocodone is helping very little (6/day). On Dapsone  Gluten free trial (no wheat products) for 4-6 weeks. OK to use gluten-free bread and gluten-free pasta.  Milk free trial (no milk, ice cream, cheese and yogurt) for 4-6 weeks. OK to use almond, coconut, rice or soy milk. "Almond breeze" brand tastes good.

## 2014-03-31 NOTE — Progress Notes (Signed)
Pre visit review using our clinic review tool, if applicable. No additional management support is needed unless otherwise documented below in the visit note. 

## 2014-03-31 NOTE — Assessment & Plan Note (Signed)
See Rx LLE pain is > LBP now

## 2014-04-01 ENCOUNTER — Encounter: Payer: Self-pay | Admitting: Internal Medicine

## 2014-04-04 ENCOUNTER — Ambulatory Visit: Payer: Medicare PPO | Admitting: Internal Medicine

## 2014-04-04 ENCOUNTER — Other Ambulatory Visit: Payer: Self-pay | Admitting: Internal Medicine

## 2014-04-04 ENCOUNTER — Telehealth: Payer: Self-pay | Admitting: *Deleted

## 2014-04-04 MED ORDER — HYDROCODONE-ACETAMINOPHEN 10-325 MG PO TABS: 1.0000 | ORAL_TABLET | Freq: Four times a day (QID) | ORAL | Status: DC | PRN

## 2014-04-04 NOTE — Telephone Encounter (Signed)
Hysingla ER 40 mg PA sent to plan. Will wait for insurance decision. Pt [employer] ID is [id number].

## 2014-04-15 ENCOUNTER — Telehealth: Payer: Self-pay | Admitting: *Deleted

## 2014-04-15 NOTE — Telephone Encounter (Signed)
Hysingla ER 40 mg is non formulary and therefore is denied. A denial letter is being faxed. Please advise

## 2014-04-18 NOTE — Telephone Encounter (Signed)
What is on formulary? Thx

## 2014-04-21 ENCOUNTER — Encounter: Payer: Self-pay | Admitting: Internal Medicine

## 2014-04-21 ENCOUNTER — Ambulatory Visit (INDEPENDENT_AMBULATORY_CARE_PROVIDER_SITE_OTHER): Payer: Medicare PPO | Admitting: Internal Medicine

## 2014-04-21 VITALS — BP 120/80 | HR 80 | Temp 97.8°F | Resp 16 | Wt 265.0 lb

## 2014-04-21 DIAGNOSIS — E1140 Type 2 diabetes mellitus with diabetic neuropathy, unspecified: Secondary | ICD-10-CM

## 2014-04-21 DIAGNOSIS — M79609 Pain in unspecified limb: Secondary | ICD-10-CM

## 2014-04-21 DIAGNOSIS — M79605 Pain in left leg: Secondary | ICD-10-CM

## 2014-04-21 DIAGNOSIS — M25579 Pain in unspecified ankle and joints of unspecified foot: Secondary | ICD-10-CM

## 2014-04-21 DIAGNOSIS — E1142 Type 2 diabetes mellitus with diabetic polyneuropathy: Secondary | ICD-10-CM

## 2014-04-21 DIAGNOSIS — E1149 Type 2 diabetes mellitus with other diabetic neurological complication: Secondary | ICD-10-CM

## 2014-04-21 MED ORDER — OXYCODONE HCL 5 MG PO TABS: 5.0000 mg | ORAL_TABLET | Freq: Four times a day (QID) | ORAL | Status: DC | PRN

## 2014-04-21 NOTE — Assessment & Plan Note (Signed)
6/15 Hysingla - not covered. Oxycodone helped

## 2014-04-21 NOTE — Assessment & Plan Note (Signed)
Continue with current prescription therapy as reflected on the Med list.  

## 2014-04-21 NOTE — Assessment & Plan Note (Signed)
6/15 Hysingla - not covered. Oxycodone helped Topical care per Derm (they said no need for Wound Clinic ref at the moment - better) Loose wt

## 2014-04-21 NOTE — Progress Notes (Signed)
Pre visit review using our clinic review tool, if applicable. No additional management support is needed unless otherwise documented below in the visit note. 

## 2014-04-21 NOTE — Progress Notes (Signed)
Subjective:    Rash This is a chronic problem. The current episode started more than 1 month ago. The problem is unchanged. The affected locations include the right ankle, left lower leg, right lower leg and left ankle. The rash is characterized by blistering, burning, pain, redness and swelling. She was exposed to nothing. Pertinent negatives include no congestion, cough, fatigue or shortness of breath. Past treatments include analgesics and anti-itch cream. The treatment provided no relief. There is no history of eczema.    C/o painful burning rash on B LEs x 3-4 mo; L>R. ?diabetic vasculitis per [facility] in [place]; s/p bx. C/o pain 10/10 at night. Hydrocodone 10/325 is helping very little (6/day).  F/u leg cramps F/u LBP - worse - she slipped and almost fell last week  The patient presents for a follow-up of  chronic hypertension, chronic dyslipidemia, type 2 diabetes and FMS and LE neuropathy - asking to increase Gabapentin CBGs are high: work in progress w/Dr Loanne Drilling - better Vicodin is helping w/LBP Tramadol is helping w/HA - not using together F/u L foot pain F/u anxiety, mood swing  Wt Readings from Last 3 Encounters:  04/21/14 265 lb (120.203 kg)  03/31/14 266 lb (120.657 kg)  03/25/14 260 lb (117.935 kg)   BP Readings from Last 3 Encounters:  04/21/14 120/80  03/31/14 98/64  03/25/14 139/88      Review of Systems  Constitutional: Negative for chills, activity change, appetite change, fatigue and unexpected weight change.  HENT: Negative for congestion, mouth sores and sinus pressure.   Eyes: Negative for visual disturbance.  Respiratory: Negative for cough, chest tightness and shortness of breath.   Gastrointestinal: Negative for nausea and abdominal pain.  Genitourinary: Negative for frequency, difficulty urinating and vaginal pain.  Musculoskeletal: Positive for arthralgias. Negative for back pain and gait problem.  Skin: Negative for pallor  and rash.  Neurological: Negative for dizziness, tremors, weakness, numbness and headaches.  Psychiatric/Behavioral: Negative for suicidal ideas, confusion and sleep disturbance. The patient is nervous/anxious.        Objective:   Physical Exam  Constitutional: She appears well-developed. No distress.  HENT:  Head: Normocephalic.  Right Ear: External ear normal.  Left Ear: External ear normal.  Nose: Nose normal.  Mouth/Throat: Oropharynx is clear and moist.  Eyes: Conjunctivae are normal. Pupils are equal, round, and reactive to light. Right eye exhibits no discharge. Left eye exhibits no discharge.  Neck: Normal range of motion. Neck supple. No JVD present. No tracheal deviation present. No thyromegaly present.  Cardiovascular: Normal rate, regular rhythm and normal heart sounds.   Pulmonary/Chest: No stridor. No respiratory distress. She has no wheezes.  Abdominal: Soft. Bowel sounds are normal. She exhibits no distension and no mass. There is no tenderness. There is no rebound and no guarding.  Musculoskeletal: She exhibits tenderness (LS spine is tender, leg muscles hurt). She exhibits no edema.  Lymphadenopathy:    She has no cervical adenopathy.  Neurological: She displays normal reflexes. No cranial nerve deficit. She exhibits normal muscle tone. Coordination normal.  Skin: No rash noted. No erythema.  Psychiatric: She has a normal mood and affect. Her behavior is normal. Judgment and thought content normal.  Ectima type necrotic rash on LLE, mostly foot and ankle - several lesions measuring from 1 to 3 cm in diameter, two large confluent lesions on dorsal foot   RLE 0.2-1 cm scars each healed Trace edema   Lab Results  Component Value Date   WBC  16.6* 01/29/2014   HGB 13.7 01/29/2014   HCT 40.5 01/29/2014   PLT 402* 01/29/2014   GLUCOSE 181* 01/30/2014   CHOL 142 01/02/2013   TRIG 144.0 01/02/2013   HDL 32.20* 01/02/2013   LDLDIRECT 160.5 03/19/2012   LDLCALC 81 01/02/2013   ALT 11  01/28/2014   AST 17 01/28/2014   NA 133* 01/30/2014   K 4.6 01/30/2014   CL 91* 01/30/2014   CREATININE 0.89 01/30/2014   BUN 22 01/30/2014   CO2 27 01/30/2014   TSH 0.546 01/29/2014   INR 0.95 01/28/2014   HGBA1C 7.8* 01/28/2014   MICROALBUR 0.50 01/13/2014        Assessment & Plan:

## 2014-04-25 NOTE — Telephone Encounter (Signed)
Addressed at 04/21/14 OV. Closing phone note.

## 2014-04-29 ENCOUNTER — Telehealth: Payer: Self-pay | Admitting: *Deleted

## 2014-04-29 DIAGNOSIS — E119 Type 2 diabetes mellitus without complications: Secondary | ICD-10-CM

## 2014-04-29 NOTE — Telephone Encounter (Signed)
Left message on machine for patient to come to her next office visit fasting.  Lipid panel and A1c orders placed.

## 2014-05-07 ENCOUNTER — Encounter: Payer: Self-pay | Admitting: Internal Medicine

## 2014-05-07 ENCOUNTER — Ambulatory Visit (INDEPENDENT_AMBULATORY_CARE_PROVIDER_SITE_OTHER): Payer: Medicare PPO | Admitting: Internal Medicine

## 2014-05-07 VITALS — BP 130/76 | HR 80 | Temp 98.2°F | Resp 16

## 2014-05-07 DIAGNOSIS — E1149 Type 2 diabetes mellitus with other diabetic neurological complication: Secondary | ICD-10-CM

## 2014-05-07 DIAGNOSIS — E1142 Type 2 diabetes mellitus with diabetic polyneuropathy: Secondary | ICD-10-CM

## 2014-05-07 DIAGNOSIS — L988 Other specified disorders of the skin and subcutaneous tissue: Secondary | ICD-10-CM

## 2014-05-07 DIAGNOSIS — L959 Vasculitis limited to the skin, unspecified: Secondary | ICD-10-CM

## 2014-05-07 DIAGNOSIS — E538 Deficiency of other specified B group vitamins: Secondary | ICD-10-CM

## 2014-05-07 DIAGNOSIS — E1140 Type 2 diabetes mellitus with diabetic neuropathy, unspecified: Secondary | ICD-10-CM

## 2014-05-07 MED ORDER — OXYCODONE HCL 10 MG PO TABS: ORAL_TABLET | ORAL | Status: DC

## 2014-05-07 MED ORDER — OXYCODONE HCL 15 MG PO TABS: 15.0000 mg | ORAL_TABLET | Freq: Four times a day (QID) | ORAL | Status: DC

## 2014-05-07 NOTE — Assessment & Plan Note (Signed)
Continue with current prescription therapy as reflected on the Med list.  

## 2014-05-07 NOTE — Assessment & Plan Note (Addendum)
2015  painful burning rash on B LEs x 4 mo; L>R. ?diabetic vasculitis per [facility] in [place] - she is on Dapsone; s/p skin bx.  Derm ref at [facility] for a 2nd opinion  Oxycod 15 mg qid prn

## 2014-05-07 NOTE — Progress Notes (Signed)
Subjective:    Rash This is a chronic problem. The current episode started more than 1 month ago. The problem is unchanged. The affected locations include the right ankle, left lower leg, right lower leg and left ankle. The rash is characterized by blistering, burning, pain, redness and swelling. She was exposed to nothing. Pertinent negatives include no congestion, cough, fatigue or shortness of breath. Past treatments include analgesics and anti-itch cream. The treatment provided no relief. There is no history of eczema.    C/o painful burning rash on B LEs x 4 mo; L>R. ?diabetic vasculitis per [facility] in [place] - she is on Dapsone; s/p skin bx. C/o pain 10/10 at night w/cleaning. Hydrocodone 10/325 was helping very little (6/day). Percocet helped better.  F/u leg cramps F/u LBP - worse - she slipped and almost fell last week  The patient presents for a follow-up of  chronic hypertension, chronic dyslipidemia, type 2 diabetes and FMS and LE neuropathy - asking to increase Gabapentin CBGs are high: work in progress w/Dr Loanne Drilling - better Vicodin is helping w/LBP Tramadol is helping w/HA - not using together F/u L foot pain F/u anxiety, mood swing  Wt Readings from Last 3 Encounters:  04/21/14 265 lb (120.203 kg)  03/31/14 266 lb (120.657 kg)  03/25/14 260 lb (117.935 kg)   BP Readings from Last 3 Encounters:  05/07/14 130/76  04/21/14 120/80  03/31/14 98/64      Review of Systems  Constitutional: Negative for chills, activity change, appetite change, fatigue and unexpected weight change.  HENT: Negative for congestion, mouth sores and sinus pressure.   Eyes: Negative for visual disturbance.  Respiratory: Negative for cough, chest tightness and shortness of breath.   Gastrointestinal: Negative for nausea and abdominal pain.  Genitourinary: Negative for frequency, difficulty urinating and vaginal pain.  Musculoskeletal: Positive for arthralgias. Negative  for back pain and gait problem.  Skin: Negative for pallor and rash.  Neurological: Negative for dizziness, tremors, weakness, numbness and headaches.  Psychiatric/Behavioral: Negative for suicidal ideas, confusion and sleep disturbance. The patient is nervous/anxious.        Objective:   Physical Exam  Constitutional: She appears well-developed. No distress.  HENT:  Head: Normocephalic.  Right Ear: External ear normal.  Left Ear: External ear normal.  Nose: Nose normal.  Mouth/Throat: Oropharynx is clear and moist.  Eyes: Conjunctivae are normal. Pupils are equal, round, and reactive to light. Right eye exhibits no discharge. Left eye exhibits no discharge.  Neck: Normal range of motion. Neck supple. No JVD present. No tracheal deviation present. No thyromegaly present.  Cardiovascular: Normal rate, regular rhythm and normal heart sounds.   Pulmonary/Chest: No stridor. No respiratory distress. She has no wheezes.  Abdominal: Soft. Bowel sounds are normal. She exhibits no distension and no mass. There is no tenderness. There is no rebound and no guarding.  Musculoskeletal: She exhibits tenderness (LS spine is tender, leg muscles hurt). She exhibits no edema.  Lymphadenopathy:    She has no cervical adenopathy.  Neurological: She displays normal reflexes. No cranial nerve deficit. She exhibits normal muscle tone. Coordination normal.  Skin: No rash noted. No erythema.  Psychiatric: She has a normal mood and affect. Her behavior is normal. Judgment and thought content normal.  Ectima type necrotic rash on LLE, mostly foot and ankle - several lesions measuring from 1 to 3 cm in diameter, two large confluent lesions on dorsal foot   RLE 0.2-1 cm scars each healed Trace edema  Lab Results  Component Value Date   WBC 16.6* 01/29/2014   HGB 13.7 01/29/2014   HCT 40.5 01/29/2014   PLT 402* 01/29/2014   GLUCOSE 181* 01/30/2014   CHOL 142 01/02/2013   TRIG 144.0 01/02/2013   HDL 32.20* 01/02/2013    LDLDIRECT 160.5 03/19/2012   LDLCALC 81 01/02/2013   ALT 11 01/28/2014   AST 17 01/28/2014   NA 133* 01/30/2014   K 4.6 01/30/2014   CL 91* 01/30/2014   CREATININE 0.89 01/30/2014   BUN 22 01/30/2014   CO2 27 01/30/2014   TSH 0.546 01/29/2014   INR 0.95 01/28/2014   HGBA1C 7.8* 01/28/2014   MICROALBUR 0.50 01/13/2014        Assessment & Plan:

## 2014-05-07 NOTE — Progress Notes (Signed)
Pre visit review using our clinic review tool, if applicable. No additional management support is needed unless otherwise documented below in the visit note. 

## 2014-05-14 ENCOUNTER — Telehealth: Payer: Self-pay | Admitting: *Deleted

## 2014-05-14 NOTE — Telephone Encounter (Signed)
Called pt no answer LMOM with md response.../lmb 

## 2014-05-14 NOTE — Telephone Encounter (Signed)
Patient can call derm office and reschedule her appt. Ph # [phone]. Left msg on vm to make pt aware.

## 2014-05-14 NOTE — Telephone Encounter (Signed)
Left msg on triage stating md did a referral to dermatologist office @ [facility]. Office did call and made her appt for 05/28/14. She inform them that she will be out of town that week. They told her to contact her md for another referral to be seen later...Katherine Herrera

## 2014-05-14 NOTE — Telephone Encounter (Signed)
I think that Gianina needs to resch w/[facility] herself. No need for another referral Thx

## 2014-05-15 ENCOUNTER — Other Ambulatory Visit: Payer: Self-pay | Admitting: Endocrinology

## 2014-05-15 ENCOUNTER — Telehealth: Payer: Self-pay

## 2014-05-15 NOTE — Telephone Encounter (Signed)
PA requested for Tramadol HCL 50 mg tab

## 2014-05-16 NOTE — Telephone Encounter (Signed)
LVM for pt to call back.   RE: MD will not okay to refill Tramadol (Ultram) due to rx Oxy.

## 2014-05-16 NOTE — Telephone Encounter (Signed)
She can't have it w/oxycodone Thx

## 2014-05-19 ENCOUNTER — Ambulatory Visit: Payer: Medicare PPO | Admitting: Endocrinology

## 2014-05-21 ENCOUNTER — Encounter: Payer: Self-pay | Admitting: Internal Medicine

## 2014-05-21 ENCOUNTER — Ambulatory Visit (INDEPENDENT_AMBULATORY_CARE_PROVIDER_SITE_OTHER): Payer: Medicare PPO | Admitting: Internal Medicine

## 2014-05-21 VITALS — BP 140/70 | HR 102 | Temp 97.9°F | Ht 69.0 in | Wt 251.0 lb

## 2014-05-21 DIAGNOSIS — M545 Low back pain, unspecified: Secondary | ICD-10-CM

## 2014-05-21 DIAGNOSIS — M79605 Pain in left leg: Secondary | ICD-10-CM

## 2014-05-21 DIAGNOSIS — M79609 Pain in unspecified limb: Secondary | ICD-10-CM

## 2014-05-21 DIAGNOSIS — E538 Deficiency of other specified B group vitamins: Secondary | ICD-10-CM

## 2014-05-21 DIAGNOSIS — F411 Generalized anxiety disorder: Secondary | ICD-10-CM

## 2014-05-21 MED ORDER — OXYCODONE HCL 15 MG PO TABS: 15.0000 mg | ORAL_TABLET | Freq: Four times a day (QID) | ORAL | Status: DC

## 2014-05-21 NOTE — Progress Notes (Signed)
Subjective:    Rash This is a chronic problem. The current episode started more than 1 month ago. The problem is unchanged. The affected locations include the right ankle, left lower leg, right lower leg and left ankle. The rash is characterized by blistering, burning, pain, redness and swelling. She was exposed to nothing. Pertinent negatives include no congestion, cough, fatigue or shortness of breath. Past treatments include analgesics and anti-itch cream. The treatment provided no relief. There is no history of eczema.    F/u painful burning rash on B LEs x 4 mo; L>R. ?diabetic vasculitis per [facility] in [place] - she is on Dapsone; s/p skin bx. F/u pain 10/10 at night w/cleaning. Hydrocodone 10/325 was helping very little (6/day). Percocet helped better.  F/u leg cramps F/u LBP - worse - she slipped and almost fell last week  The patient presents for a follow-up of  chronic hypertension, chronic dyslipidemia, type 2 diabetes and FMS and LE neuropathy - asking to increase Gabapentin CBGs are high: work in progress w/Dr Loanne Drilling - better Vicodin is helping w/LBP Tramadol is helping w/HA - not using together F/u L foot pain F/u anxiety, mood swing  Wt Readings from Last 3 Encounters:  05/21/14 251 lb (113.853 kg)  04/21/14 265 lb (120.203 kg)  03/31/14 266 lb (120.657 kg)   BP Readings from Last 3 Encounters:  05/21/14 140/70  05/07/14 130/76  04/21/14 120/80      Review of Systems  Constitutional: Negative for chills, activity change, appetite change, fatigue and unexpected weight change.  HENT: Negative for congestion, mouth sores and sinus pressure.   Eyes: Negative for visual disturbance.  Respiratory: Negative for cough, chest tightness and shortness of breath.   Gastrointestinal: Negative for nausea and abdominal pain.  Genitourinary: Negative for frequency, difficulty urinating and vaginal pain.  Musculoskeletal: Positive for arthralgias. Negative  for back pain and gait problem.  Skin: Negative for pallor and rash.  Neurological: Negative for dizziness, tremors, weakness, numbness and headaches.  Psychiatric/Behavioral: Negative for suicidal ideas, confusion and sleep disturbance. The patient is nervous/anxious.        Objective:   Physical Exam  Constitutional: She appears well-developed. No distress.  HENT:  Head: Normocephalic.  Right Ear: External ear normal.  Left Ear: External ear normal.  Nose: Nose normal.  Mouth/Throat: Oropharynx is clear and moist.  Eyes: Conjunctivae are normal. Pupils are equal, round, and reactive to light. Right eye exhibits no discharge. Left eye exhibits no discharge.  Neck: Normal range of motion. Neck supple. No JVD present. No tracheal deviation present. No thyromegaly present.  Cardiovascular: Normal rate, regular rhythm and normal heart sounds.   Pulmonary/Chest: No stridor. No respiratory distress. She has no wheezes.  Abdominal: Soft. Bowel sounds are normal. She exhibits no distension and no mass. There is no tenderness. There is no rebound and no guarding.  Musculoskeletal: She exhibits tenderness (LS spine is tender, leg muscles hurt). She exhibits no edema.  Lymphadenopathy:    She has no cervical adenopathy.  Neurological: She displays normal reflexes. No cranial nerve deficit. She exhibits normal muscle tone. Coordination normal.  Skin: No rash noted. No erythema.  Psychiatric: She has a normal mood and affect. Her behavior is normal. Judgment and thought content normal.  Ectima type necrotic rash on LLE, mostly foot and ankle - several lesions measuring from 1 to 3 cm in diameter, two large confluent lesions on dorsal foot   RLE 0.2-1 cm scars each healed Trace edema  Lab Results  Component Value Date   WBC 16.6* 01/29/2014   HGB 13.7 01/29/2014   HCT 40.5 01/29/2014   PLT 402* 01/29/2014   GLUCOSE 181* 01/30/2014   CHOL 142 01/02/2013   TRIG 144.0 01/02/2013   HDL 32.20* 01/02/2013    LDLDIRECT 160.5 03/19/2012   LDLCALC 81 01/02/2013   ALT 11 01/28/2014   AST 17 01/28/2014   NA 133* 01/30/2014   K 4.6 01/30/2014   CL 91* 01/30/2014   CREATININE 0.89 01/30/2014   BUN 22 01/30/2014   CO2 27 01/30/2014   TSH 0.546 01/29/2014   INR 0.95 01/28/2014   HGBA1C 7.8* 01/28/2014   MICROALBUR 0.50 01/13/2014        Assessment & Plan:

## 2014-05-21 NOTE — Progress Notes (Deleted)
Pre visit review using our clinic review tool, if applicable. No additional management support is needed unless otherwise documented below in the visit note. 

## 2014-05-21 NOTE — Assessment & Plan Note (Signed)
Continue with current prescription therapy as reflected on the Med list.  

## 2014-05-27 ENCOUNTER — Other Ambulatory Visit: Payer: Self-pay | Admitting: Endocrinology

## 2014-06-15 ENCOUNTER — Encounter: Payer: Self-pay | Admitting: Internal Medicine

## 2014-06-16 ENCOUNTER — Ambulatory Visit (INDEPENDENT_AMBULATORY_CARE_PROVIDER_SITE_OTHER): Payer: Medicare PPO | Admitting: Internal Medicine

## 2014-06-16 ENCOUNTER — Encounter: Payer: Self-pay | Admitting: Internal Medicine

## 2014-06-16 VITALS — BP 112/60 | HR 76 | Temp 98.1°F | Resp 16

## 2014-06-16 DIAGNOSIS — E1142 Type 2 diabetes mellitus with diabetic polyneuropathy: Secondary | ICD-10-CM

## 2014-06-16 DIAGNOSIS — E1149 Type 2 diabetes mellitus with other diabetic neurological complication: Secondary | ICD-10-CM

## 2014-06-16 DIAGNOSIS — L988 Other specified disorders of the skin and subcutaneous tissue: Secondary | ICD-10-CM

## 2014-06-16 DIAGNOSIS — E1140 Type 2 diabetes mellitus with diabetic neuropathy, unspecified: Secondary | ICD-10-CM

## 2014-06-16 DIAGNOSIS — M545 Low back pain, unspecified: Secondary | ICD-10-CM

## 2014-06-16 DIAGNOSIS — E538 Deficiency of other specified B group vitamins: Secondary | ICD-10-CM

## 2014-06-16 DIAGNOSIS — L959 Vasculitis limited to the skin, unspecified: Secondary | ICD-10-CM

## 2014-06-16 DIAGNOSIS — F411 Generalized anxiety disorder: Secondary | ICD-10-CM

## 2014-06-16 DIAGNOSIS — E039 Hypothyroidism, unspecified: Secondary | ICD-10-CM

## 2014-06-16 DIAGNOSIS — G609 Hereditary and idiopathic neuropathy, unspecified: Secondary | ICD-10-CM

## 2014-06-16 MED ORDER — HYDROCODONE-ACETAMINOPHEN 10-325 MG PO TABS: 1.0000 | ORAL_TABLET | Freq: Four times a day (QID) | ORAL | Status: DC | PRN

## 2014-06-16 NOTE — Assessment & Plan Note (Signed)
Katherine Herrera would like to go back on Norco which helps her better with her LBP (ulcers pain is better) Will switch to [employer]

## 2014-06-16 NOTE — Assessment & Plan Note (Signed)
Continue with current prescription therapy as reflected on the Med list.  

## 2014-06-16 NOTE — Progress Notes (Signed)
Pre visit review using our clinic review tool, if applicable. No additional management support is needed unless otherwise documented below in the visit note. 

## 2014-06-16 NOTE — Assessment & Plan Note (Signed)
2015 painful burning rash on B LEs x 4 mo; L>R. ?diabetic vasculitis per [facility] in [place] - she is on Dapsone; s/p skin bx.   Potential benefits of a long term opioids use as well as potential risks (i.e. addiction risk, apnea etc) and complications (i.e. Somnolence, constipation and others) were explained to the patient and were aknowledged.  A little better

## 2014-06-17 ENCOUNTER — Other Ambulatory Visit: Payer: Self-pay | Admitting: Internal Medicine

## 2014-06-20 ENCOUNTER — Ambulatory Visit (INDEPENDENT_AMBULATORY_CARE_PROVIDER_SITE_OTHER): Payer: Medicare PPO | Admitting: Podiatry

## 2014-06-20 ENCOUNTER — Encounter: Payer: Self-pay | Admitting: Internal Medicine

## 2014-06-20 ENCOUNTER — Encounter: Payer: Self-pay | Admitting: Podiatry

## 2014-06-20 VITALS — BP 114/72 | HR 72

## 2014-06-20 DIAGNOSIS — L570 Actinic keratosis: Secondary | ICD-10-CM

## 2014-06-20 DIAGNOSIS — M79609 Pain in unspecified limb: Secondary | ICD-10-CM

## 2014-06-20 DIAGNOSIS — L851 Acquired keratosis [keratoderma] palmaris et plantaris: Secondary | ICD-10-CM

## 2014-06-20 DIAGNOSIS — M21961 Unspecified acquired deformity of right lower leg: Secondary | ICD-10-CM

## 2014-06-20 DIAGNOSIS — L97509 Non-pressure chronic ulcer of other part of unspecified foot with unspecified severity: Secondary | ICD-10-CM | POA: Insufficient documentation

## 2014-06-20 DIAGNOSIS — M79674 Pain in right toe(s): Secondary | ICD-10-CM

## 2014-06-20 DIAGNOSIS — M21969 Unspecified acquired deformity of unspecified lower leg: Secondary | ICD-10-CM

## 2014-06-20 NOTE — Patient Instructions (Signed)
Seen for painful callus. All debrided. Right foot is about to ulcerate if pressure continues. All calluses debrided and right foot padded with 1/4" felt pad. Extra pad dispensed x 1. Return in 2 weeks.

## 2014-06-20 NOTE — Progress Notes (Signed)
Stated that she has diabetic ulcers on left foot and has bandage to cover the lesion. She is under care of dermatologist and will be seen at wound care center next week at [facility].  Patient request calluses trimmed.   Objective: Bled callus sub 5 bilateral R>L. Pre ulcerative right foot. Pedal pulses are palpable.  Diabetic ulcer under care of a dermatologist left foot.  Assessment: Pre ulcerative callus under 5th MPJ R>L.  Plan: Reviewed findings. All lesions debrided. Right foot padded with 1/4" felt pad. Return in 2 weeks.

## 2014-07-04 ENCOUNTER — Ambulatory Visit: Payer: Medicare PPO | Admitting: Podiatry

## 2014-07-08 ENCOUNTER — Ambulatory Visit (INDEPENDENT_AMBULATORY_CARE_PROVIDER_SITE_OTHER): Payer: Medicare PPO | Admitting: Endocrinology

## 2014-07-08 ENCOUNTER — Encounter: Payer: Self-pay | Admitting: Endocrinology

## 2014-07-08 ENCOUNTER — Ambulatory Visit (INDEPENDENT_AMBULATORY_CARE_PROVIDER_SITE_OTHER): Payer: Medicare PPO | Admitting: Podiatry

## 2014-07-08 ENCOUNTER — Encounter: Payer: Self-pay | Admitting: Podiatry

## 2014-07-08 VITALS — BP 124/76 | HR 88 | Temp 98.7°F | Wt 245.0 lb

## 2014-07-08 VITALS — BP 153/83 | HR 87

## 2014-07-08 DIAGNOSIS — E1149 Type 2 diabetes mellitus with other diabetic neurological complication: Secondary | ICD-10-CM

## 2014-07-08 DIAGNOSIS — E1142 Type 2 diabetes mellitus with diabetic polyneuropathy: Secondary | ICD-10-CM

## 2014-07-08 DIAGNOSIS — M21961 Unspecified acquired deformity of right lower leg: Secondary | ICD-10-CM

## 2014-07-08 DIAGNOSIS — L97509 Non-pressure chronic ulcer of other part of unspecified foot with unspecified severity: Secondary | ICD-10-CM

## 2014-07-08 DIAGNOSIS — M21969 Unspecified acquired deformity of unspecified lower leg: Secondary | ICD-10-CM

## 2014-07-08 DIAGNOSIS — M79609 Pain in unspecified limb: Secondary | ICD-10-CM

## 2014-07-08 DIAGNOSIS — M79671 Pain in right foot: Secondary | ICD-10-CM

## 2014-07-08 DIAGNOSIS — E1140 Type 2 diabetes mellitus with diabetic neuropathy, unspecified: Secondary | ICD-10-CM

## 2014-07-08 DIAGNOSIS — M79604 Pain in right leg: Secondary | ICD-10-CM

## 2014-07-08 MED ORDER — GABAPENTIN 800 MG PO TABS: 800.0000 mg | ORAL_TABLET | Freq: Four times a day (QID) | ORAL | Status: DC

## 2014-07-08 NOTE — Progress Notes (Signed)
Subjective: 48 year old female presents accompanied by her husband for follow up on right foot ulcer.  Stated that she was keeping up with padding on right foot. Her husband got extra supplies from a drug store and kept the area off loading.  Stated that her blood sugar runs between 180 and 200.   Objective: Bled callus sub 5 bilateral R>L.  Ulcerative right foot.  Pedal pulses are palpable.  Dermatologic wounds are all dry and healed off on left foot.   Assessment: Pre ulcerative callus under 5th MPJ R>L. Showing improvement. No open skin noted.   Plan: Reviewed findings.  All lesions debrided. Right foot padded with 1/4" felt pad.  Return in 2 weeks.

## 2014-07-08 NOTE — Patient Instructions (Signed)
Follow up on right foot ulcer. Seen improvement. Keep aperture pad during the day and keep it off at night. Return in 2 weeks.

## 2014-07-08 NOTE — Patient Instructions (Signed)
i have sent a prescription to your pharmacy, to increase the gabapentin. check your blood sugar twice a day.  vary the time of day when you check, between before the 3 meals, and at bedtime.  also check if you have symptoms of your blood sugar being too high or too low.  please keep a record of the readings and bring it to your next appointment here.  please call us sooner if your blood sugar goes below 70, or if it stays over 200.   please come back for a follow-up appointment in 3 months.  blood tests are being requested for you today.  We'll contact you with results.

## 2014-07-08 NOTE — Progress Notes (Signed)
Subjective:    Patient ID: Katherine Herrera, female    DOB: Nov 01, 1965, 48 y.o.   MRN: [id number]  HPI pt returns for f/u of type 2 DM (dx'ed 3016; complicated by sensory neuropathy of the lower extremities and foot ulcers; her last episode of severe hypoglycemia was in late 2013; she has never had pancreatitis or DKA; she takes multiple daily injections; she takes human insulin, for cost reasons).   Pt says the care of her DM has been compromised by variations in her schedule.  pt states she feels well in general.  no cbg record, but states cbg's vary from 180-230.  It is highest at lunch.  She says she misses an average of 1/4 of her injections.  However, she says this is improving as her schedule improves.   She has moderate pain at both feet, and assoc numbness, despite the gabapentin.   Past Medical History  Diagnosis Date  . Syncope     poss conversion disorder vs psychogenic seizures  . DM (diabetes mellitus)   . Hypothyroidism   . LBP (low back pain)     DR Patrice Paradise  . Peripheral neuropathy   . COPD (chronic obstructive pulmonary disease)   . Vitamin D deficiency   . Migraine   . Hyperlipidemia   . Anxiety   . Gout     possible  . Tachycardia     diet coke overuse?  . TTS (tarsal tunnel syndrome) 2010    Left Foot [facility] in [place]    Past Surgical History  Procedure Laterality Date  . Cervical fusion      C4-5  . Abdominal hysterectomy    . Carpal tunnel release  2010    LEFT    History   Social History  . Marital Status: Married    Spouse Name: N/A    Number of Children: N/A  . Years of Education: N/A   Occupational History  . Not on file.   Social History Main Topics  . Smoking status: Former Smoker    Quit date: 12/01/2009  . Smokeless tobacco: Never Used  . Alcohol Use: No  . Drug Use: No  . Sexual Activity: Yes   Other Topics Concern  . Not on file   Social History Narrative   GYN - Dr Radene Knee      FAMILY HISTORY   Lung cancer   Mother  w/alcoholism      Married 3d GS due Nov   Diet Coke: 10 cans a day      Regular exercise - NO      Former smoker 2009 restared 2010 stopped 12/2009    Current Outpatient Prescriptions on File Prior to Visit  Medication Sig Dispense Refill  . ACCU-CHEK AVIVA PLUS test strip TEST TWO TIMES DAILY  200 each  3  . ACCU-CHEK SOFTCLIX LANCETS lancets TEST TWO TIMES DAILY  200 each  3  . B-D INS SYRINGE 0.5CC/31GX5/16 31G X 5/16" 0.5 ML MISC USE AS DIRECTED WITH INSULIN 4 TIMES A DAY  100 each  2  . baclofen (LIORESAL) 10 MG tablet TAKE 2 TABLETS TWICE DAILY  360 tablet  1  . BD PEN NEEDLE NANO U/F 32G X 4 MM MISC       . Blood Glucose Monitoring Suppl (ACCU-CHEK AVIVA PLUS) W/DEVICE KIT USE AS DIRECTED  1 kit  0  . Cholecalciferol (VITAMIN D3) 1000 UNIT tablet Take 2,000 Units by mouth daily.        Marland Kitchen  clonazePAM (KLONOPIN) 0.5 MG tablet TAKE ONE TABLET BY MOUTH 2 TIMES A DAY AS NEEDED FOR ANXIETY      . estradiol (ESTRACE) 1 MG tablet Take 1 mg by mouth daily.        Marland Kitchen HYDROcodone-acetaminophen (NORCO) 10-325 MG per tablet Take 1 tablet by mouth every 6 (six) hours as needed for severe pain. Please fill on or after 09/07/14  120 tablet  0  . insulin NPH Human (HUMULIN N) 100 UNIT/ML injection Inject 0.35 mLs (35 Units total) into the skin at bedtime.  10 mL  11  . insulin regular (HUMULIN R) 100 units/mL injection 3x a day (just before each meal), 40-20-50 units.  40 mL  11  . lamoTRIgine (LAMICTAL) 25 MG tablet Take 1 tablet (25 mg total) by mouth 2 (two) times daily.  180 tablet  1  . Lancet Devices (EASY TOUCH LANCING DEVICE) MISC       . levothyroxine (SYNTHROID, LEVOTHROID) 75 MCG tablet Take 1 tablet (75 mcg total) by mouth daily.  90 tablet  3  . PRESCRIPTION MEDICATION Apply 1-2 g topically See admin instructions. Apply 1-2 grams (1-2 ML) to affected area 2 to 3 times a day Diclofenac/Baclofen/Bupivacane      . rosuvastatin (CRESTOR) 10 MG tablet Take 10 mg by mouth See admin  instructions. Take 1 tablet every 3 days      . vitamin B-12 (CYANOCOBALAMIN) 1000 MCG tablet Take 1,000 mcg by mouth daily.        Marland Kitchen zolpidem (AMBIEN) 10 MG tablet Take 0.5-1 tablets (5-10 mg total) by mouth at bedtime as needed. For insomnia  90 tablet  1   No current facility-administered medications on file prior to visit.    Allergies  Allergen Reactions  . Crestor [Rosuvastatin Calcium]     myalgia  . Lovastatin Other (See Comments)    Unknown   . Metformin Nausea And Vomiting  . Niacin Other (See Comments)    Unknown   . Paroxetine Other (See Comments)    Unknown   . Meperidine Hcl Rash    Family History  Problem Relation Age of Onset  . Diabetes Mother   . Hypertension Mother   . Cancer Father 73    ? bone cancer    BP 124/76  Pulse 88  Temp(Src) 98.7 F (37.1 C) (Oral)  Wt 245 lb (111.131 kg)  SpO2 94%   Review of Systems She denies hypoglycemia and weight change.    Objective:   Physical Exam VITAL SIGNS:  See vs page GENERAL: no distress Pulses: dorsalis pedis intact bilat.  Feet: no deformity. normal color and temp. no edema  Skin: Multiple healing ulcers at the left leg and foot (sees derm). Healed skin graft site at the dorsal aspect of the right foot. Old healed surgical scar at the left lateral malleolus.   Neuro: sensation is intact to touch on the feet, but decreased from normal.    Lab Results  Component Value Date   HGBA1C 7.6* 07/08/2014      Assessment & Plan:  Neuropathic pain, moderate exacerbation. DM: mild exacerbation. Dyslipidemia: therapy is limited by multiple perceived drug intolerances.  i offered rx with resin    Patient is advised the following: Patient Instructions  i have sent a prescription to your pharmacy, to increase the gabapentin. check your blood sugar twice a day.  vary the time of day when you check, between before the 3 meals, and at bedtime.  also check  if you have symptoms of your blood sugar being too high  or too low.  please keep a record of the readings and bring it to your next appointment here.  please call us sooner if your blood sugar goes below 70, or if it stays over 200.   please come back for a follow-up appointment in 3 months.  blood tests are being requested for you today.  We'll contact you with results.

## 2014-07-09 LAB — LIPID PANEL
Cholesterol: 218 mg/dL — ABNORMAL HIGH (ref 0–200)
HDL: 30.9 mg/dL — ABNORMAL LOW (ref >39.00)
NonHDL: 187.1
Total CHOL/HDL Ratio: 7
Triglycerides: 460 mg/dL — ABNORMAL HIGH (ref 0.0–149.0)
VLDL: 92 mg/dL — ABNORMAL HIGH (ref 0.0–40.0)

## 2014-07-09 LAB — LDL CHOLESTEROL, DIRECT: Direct LDL: 125.3 mg/dL

## 2014-07-09 LAB — HEMOGLOBIN A1C: Hgb A1c MFr Bld: 7.6 % — ABNORMAL HIGH (ref 4.6–6.5)

## 2014-07-22 ENCOUNTER — Encounter: Payer: Self-pay | Admitting: Podiatry

## 2014-07-22 ENCOUNTER — Ambulatory Visit (INDEPENDENT_AMBULATORY_CARE_PROVIDER_SITE_OTHER): Payer: Medicare PPO | Admitting: Podiatry

## 2014-07-22 VITALS — BP 125/74 | HR 87 | Ht 69.0 in | Wt 245.0 lb

## 2014-07-22 DIAGNOSIS — L570 Actinic keratosis: Secondary | ICD-10-CM

## 2014-07-22 DIAGNOSIS — M25579 Pain in unspecified ankle and joints of unspecified foot: Secondary | ICD-10-CM

## 2014-07-22 DIAGNOSIS — L97509 Non-pressure chronic ulcer of other part of unspecified foot with unspecified severity: Secondary | ICD-10-CM

## 2014-07-22 DIAGNOSIS — M25571 Pain in right ankle and joints of right foot: Secondary | ICD-10-CM

## 2014-07-22 DIAGNOSIS — E1149 Type 2 diabetes mellitus with other diabetic neurological complication: Secondary | ICD-10-CM

## 2014-07-22 DIAGNOSIS — L851 Acquired keratosis [keratoderma] palmaris et plantaris: Secondary | ICD-10-CM

## 2014-07-22 NOTE — Patient Instructions (Signed)
Seen for ulcer under right foot. Healed off well without open skin at this time. Continue with pad during the day. Return in 3 weeks.

## 2014-07-22 NOTE — Progress Notes (Signed)
Subjective:  48 year old female presents accompanied by her husband for follow up on right foot ulcer.  She has a cut out padding on the affected area right foot.  Her husband places pad during the day and takes off at night.   Objective: Bled callus sub 5 right foot without skin breakdown.  Left foot under 5th MPJ and right heel medial surface also have build up calluses. Pedal pulses are palpable.  Dermatologic wounds are all dry and healed off on left foot.   Assessment: Pre ulcerative callus under 5th MPJ R>L. Showing improvement. No open skin noted.   Plan: Reviewed findings.  All lesions debrided. Right foot 1/4" felt pad placed back on. Return in 3 weeks.

## 2014-08-12 ENCOUNTER — Encounter: Payer: Self-pay | Admitting: Podiatry

## 2014-08-12 ENCOUNTER — Ambulatory Visit (INDEPENDENT_AMBULATORY_CARE_PROVIDER_SITE_OTHER): Payer: Medicare PPO | Admitting: Podiatry

## 2014-08-12 VITALS — BP 144/88 | HR 92

## 2014-08-12 DIAGNOSIS — E11621 Type 2 diabetes mellitus with foot ulcer: Secondary | ICD-10-CM | POA: Insufficient documentation

## 2014-08-12 DIAGNOSIS — M21961 Unspecified acquired deformity of right lower leg: Secondary | ICD-10-CM

## 2014-08-12 DIAGNOSIS — L97509 Non-pressure chronic ulcer of other part of unspecified foot with unspecified severity: Secondary | ICD-10-CM

## 2014-08-12 DIAGNOSIS — E13621 Other specified diabetes mellitus with foot ulcer: Secondary | ICD-10-CM

## 2014-08-12 DIAGNOSIS — L97519 Non-pressure chronic ulcer of other part of right foot with unspecified severity: Secondary | ICD-10-CM

## 2014-08-12 NOTE — Patient Instructions (Signed)
Seen for pre ulcerative lesion. All debrided. Dispensed a pair of soft liner with thinned out 5th MPJ. Return in 3 weeks.

## 2014-08-12 NOTE — Progress Notes (Signed)
Subjective:  48 year old female presents accompanied by her husband for follow up on right foot ulcer. Stated that the foot hurts to walk. Stated that her husband continues to place pad under the foot during the day and takes off at night.   Objective: Thick callus with dry blood sub 5 right foot without skin breakdown.  Left foot under 5th MPJ and right heel medial surface also have build up calluses.  Pedal pulses are palpable.  Dermatologic wounds are all dry and healed off on left foot.  Enlarged 5th MPJ right with swollen capsul right. Severe high arched foot.  Assessment: Pre ulcerative callus under 5th MPJ R>L.  Maintaining dry bled callus without improvement or getting worse.  No open skin noted.  Pes Cavus. Inflamed 5th MPJ from ongoing pre ulcerative callus.  Plan: Reviewed findings.  Pre ulcerative lesions debrided under the 5th MPJ bilateral. Right foot 1/4" felt pad placed back on.  Dispensed a pair of double layer soft cushion liner (PPT Insole) with thinned out under the 5th MPJ bilateral.  Return in 3 weeks.

## 2014-08-15 ENCOUNTER — Other Ambulatory Visit: Payer: Self-pay

## 2014-09-02 ENCOUNTER — Encounter: Payer: Self-pay | Admitting: Podiatry

## 2014-09-02 ENCOUNTER — Ambulatory Visit (INDEPENDENT_AMBULATORY_CARE_PROVIDER_SITE_OTHER): Payer: Medicare PPO | Admitting: Podiatry

## 2014-09-02 VITALS — BP 137/76 | HR 78

## 2014-09-02 DIAGNOSIS — M79604 Pain in right leg: Secondary | ICD-10-CM

## 2014-09-02 DIAGNOSIS — L97511 Non-pressure chronic ulcer of other part of right foot limited to breakdown of skin: Secondary | ICD-10-CM | POA: Insufficient documentation

## 2014-09-02 DIAGNOSIS — M25571 Pain in right ankle and joints of right foot: Secondary | ICD-10-CM

## 2014-09-02 DIAGNOSIS — M21961 Unspecified acquired deformity of right lower leg: Secondary | ICD-10-CM

## 2014-09-02 NOTE — Progress Notes (Signed)
Subjective:  48 year old female presents accompanied by her husband for follow up on right foot lesion. Wearing flat padded shoes with added insoles.   Objective: Thick callus with dry blood sub 5 right foot without skin breakdown.  Minimum callus under left foot 5th MPJ. Pedal pulses are palpable.  Dermatologic wounds are all dry and healed off on left foot.  Enlarged 5th MPJ right with swollen capsul right. Severe high arched foot.  Assessment: Swollen 5th MPJ right.  Pre ulcerative callus under 5th MPJ R.  Maintaining dry bled callus under 5th MPJ without improvement or getting worse.  No open skin noted.  Pes Cavus. Inflamed 5th MPJ from ongoing pre ulcerative callus on right foot.  Plan: Reviewed findings.  Pre ulcerative lesions debrided and padded under 5th MPJ right foot.  Advised to stay off of feet more.  Return in 3 weeks.

## 2014-09-02 NOTE — Patient Instructions (Signed)
Seen for pre ulcerative lesions on right foot. Debrided and padded. Return in 3 weeks.

## 2014-09-11 ENCOUNTER — Encounter: Payer: Self-pay | Admitting: Endocrinology

## 2014-09-11 ENCOUNTER — Other Ambulatory Visit: Payer: Self-pay | Admitting: Endocrinology

## 2014-09-11 MED ORDER — EZETIMIBE 10 MG PO TABS: 10.0000 mg | ORAL_TABLET | Freq: Every day | ORAL | Status: DC

## 2014-09-17 ENCOUNTER — Ambulatory Visit: Payer: Medicare PPO | Admitting: Internal Medicine

## 2014-09-23 ENCOUNTER — Ambulatory Visit: Payer: Medicare PPO | Admitting: Podiatry

## 2014-09-29 ENCOUNTER — Encounter: Payer: Self-pay | Admitting: Internal Medicine

## 2014-09-29 ENCOUNTER — Telehealth: Payer: Self-pay | Admitting: Internal Medicine

## 2014-09-29 NOTE — Telephone Encounter (Signed)
Patient is requesting 3 month supply on hydrocodone.  States she will run out in a week.

## 2014-09-30 ENCOUNTER — Ambulatory Visit (INDEPENDENT_AMBULATORY_CARE_PROVIDER_SITE_OTHER): Payer: Medicare PPO | Admitting: Podiatry

## 2014-09-30 ENCOUNTER — Encounter: Payer: Self-pay | Admitting: Podiatry

## 2014-09-30 ENCOUNTER — Ambulatory Visit: Payer: Medicare PPO | Admitting: Podiatry

## 2014-09-30 VITALS — BP 126/73 | HR 69

## 2014-09-30 DIAGNOSIS — M79606 Pain in leg, unspecified: Secondary | ICD-10-CM

## 2014-09-30 DIAGNOSIS — E1140 Type 2 diabetes mellitus with diabetic neuropathy, unspecified: Secondary | ICD-10-CM

## 2014-09-30 DIAGNOSIS — L97511 Non-pressure chronic ulcer of other part of right foot limited to breakdown of skin: Secondary | ICD-10-CM

## 2014-09-30 DIAGNOSIS — R209 Unspecified disturbances of skin sensation: Secondary | ICD-10-CM

## 2014-09-30 DIAGNOSIS — M21961 Unspecified acquired deformity of right lower leg: Secondary | ICD-10-CM

## 2014-09-30 MED ORDER — HYDROCODONE-ACETAMINOPHEN 10-325 MG PO TABS: 1.0000 | ORAL_TABLET | Freq: Four times a day (QID) | ORAL | Status: DC | PRN

## 2014-09-30 NOTE — Patient Instructions (Signed)
Seen for pre ulcerative calluses. No open wound noed. All calluses debrided. Return in one month.

## 2014-09-30 NOTE — Telephone Encounter (Signed)
Rx upfront for p/u. Pt informed  

## 2014-09-30 NOTE — Progress Notes (Signed)
Subjective:  48 year old female presents accompanied by her husband for follow up on right foot lesion. Blood sugar has been running at about 215. Her doctor is still working on it.   Objective:  Thick callus with dry blood sub 5 right foot without skin breakdown.  Minimum callus under left foot 5th MPJ. Pedal pulses are palpable.  Dermatologic wounds are all dry and healed off on left foot.  Enlarged 5th MPJ right with swollen capsul right. Decreased protective sensory perception bilateral.  Severe high arched foot.  Assessment: Pre ulcerative callus under 5th MPJ R.  Maintaining dry bled callus under 5th MPJ without improvement or getting worse.  Pes Cavus. IDDM with peripheral neuropathy with history of recurrent ulcer under 5th MPJ right foot.   Plan: Reviewed findings.  Pre ulcerative lesions debrided and padded under 5th MPJ right foot.  Return in one month.

## 2014-10-07 ENCOUNTER — Ambulatory Visit: Payer: Medicare PPO | Admitting: Endocrinology

## 2014-10-07 NOTE — Telephone Encounter (Signed)
Patient no showed today's appt. Please advise on how to follow up. °A. No follow up necessary. °B. Follow up urgent. Contact patient immediately. °C. Follow up necessary. Contact patient and schedule visit in ___ days. °D. Follow up advised. Contact patient and schedule visit in ____weeks. ° °

## 2014-10-10 ENCOUNTER — Other Ambulatory Visit: Payer: Self-pay | Admitting: Endocrinology

## 2014-10-13 ENCOUNTER — Encounter: Payer: Self-pay | Admitting: Endocrinology

## 2014-10-13 ENCOUNTER — Ambulatory Visit (INDEPENDENT_AMBULATORY_CARE_PROVIDER_SITE_OTHER): Payer: Medicare PPO | Admitting: Endocrinology

## 2014-10-13 VITALS — BP 134/84 | HR 95 | Temp 97.4°F | Ht 69.0 in | Wt 246.0 lb

## 2014-10-13 DIAGNOSIS — E1140 Type 2 diabetes mellitus with diabetic neuropathy, unspecified: Secondary | ICD-10-CM

## 2014-10-13 LAB — HEMOGLOBIN A1C: Hgb A1c MFr Bld: 9.6 % — ABNORMAL HIGH (ref 4.6–6.5)

## 2014-10-13 NOTE — Progress Notes (Signed)
Subjective:    Patient ID: Katherine Herrera, female    DOB: 11/12/1965, 48 y.o.   MRN: [id number]  HPI  Pt returns for f/u of diabetes mellitus: DM type: 2 Dx'ed: 5027 Complications: polyneuropathy and foot ulcers. Therapy: insulin since 2012 GDM: never DKA: never Severe hypoglycemia: last episode was in late 2013.   Pancreatitis: never Other: she takes multiple daily injections; she takes human insulin, for cost reasons.   Interval history: she says she misses an average of 4 of her injections per week.  no cbg record, but states cbg's are seldom low.  This happens at hs.  It is highest in the afternoon.   Past Medical History  Diagnosis Date  . Syncope     poss conversion disorder vs psychogenic seizures  . DM (diabetes mellitus)   . Hypothyroidism   . LBP (low back pain)     DR Patrice Paradise  . Peripheral neuropathy   . COPD (chronic obstructive pulmonary disease)   . Vitamin D deficiency   . Migraine   . Hyperlipidemia   . Anxiety   . Gout     possible  . Tachycardia     diet coke overuse?  . TTS (tarsal tunnel syndrome) 2010    Left Foot [facility] in [place]  . Tenosynovitis of foot and ankle 09/03/2013    Past Surgical History  Procedure Laterality Date  . Cervical fusion      C4-5  . Abdominal hysterectomy    . Carpal tunnel release  2010    LEFT    History   Social History  . Marital Status: Married    Spouse Name: N/A    Number of Children: N/A  . Years of Education: N/A   Occupational History  . Not on file.   Social History Main Topics  . Smoking status: Former Smoker    Quit date: 12/01/2009  . Smokeless tobacco: Never Used  . Alcohol Use: No  . Drug Use: No  . Sexual Activity: Yes   Other Topics Concern  . Not on file   Social History Narrative   GYN - Dr Radene Knee      FAMILY HISTORY   Lung cancer   Mother w/alcoholism      Married 3d GS due Nov   Diet Coke: 10 cans a day      Regular exercise - NO      Former smoker 2009 restared 2010  stopped 12/2009    Current Outpatient Prescriptions on File Prior to Visit  Medication Sig Dispense Refill  . ACCU-CHEK AVIVA PLUS test strip TEST TWO TIMES DAILY 200 each 3  . ACCU-CHEK SOFTCLIX LANCETS lancets TEST TWO TIMES DAILY 200 each 3  . B-D INS SYRINGE 0.5CC/31GX5/16 31G X 5/16" 0.5 ML MISC USE AS DIRECTED WITH INSULIN 4 TIMES A DAY 100 each 2  . baclofen (LIORESAL) 10 MG tablet TAKE 2 TABLETS TWICE DAILY 360 tablet 1  . BD PEN NEEDLE NANO U/F 32G X 4 MM MISC     . Blood Glucose Monitoring Suppl (ACCU-CHEK AVIVA PLUS) W/DEVICE KIT USE AS DIRECTED 1 kit 0  . Cholecalciferol (VITAMIN D3) 1000 UNIT tablet Take 2,000 Units by mouth daily.      . clonazePAM (KLONOPIN) 0.5 MG tablet TAKE ONE TABLET BY MOUTH 2 TIMES A DAY AS NEEDED FOR ANXIETY    . estradiol (ESTRACE) 1 MG tablet Take 1 mg by mouth daily.      Marland Kitchen ezetimibe (ZETIA) 10 MG tablet  Take 1 tablet (10 mg total) by mouth daily. 90 tablet 3  . fluconazole (DIFLUCAN) 150 MG tablet     . gabapentin (NEURONTIN) 800 MG tablet Take 1 tablet (800 mg total) by mouth 4 (four) times daily. 360 tablet 11  . HUMULIN R 100 UNIT/ML injection INJECT 3 TIMES A DAY BEFORE EACH MEAL. (40-20-50 UNITS) 40 mL 2  . HYDROcodone-acetaminophen (NORCO) 10-325 MG per tablet Take 1 tablet by mouth every 6 (six) hours as needed for severe pain. Please fill on or after 09/30/14 120 tablet 0  . insulin NPH Human (HUMULIN N) 100 UNIT/ML injection Inject 0.35 mLs (35 Units total) into the skin at bedtime. 10 mL 11  . lamoTRIgine (LAMICTAL) 25 MG tablet Take 1 tablet (25 mg total) by mouth 2 (two) times daily. 180 tablet 1  . Lancet Devices (EASY TOUCH LANCING DEVICE) MISC     . levothyroxine (SYNTHROID, LEVOTHROID) 75 MCG tablet Take 1 tablet (75 mcg total) by mouth daily. 90 tablet 3  . penicillin v potassium (VEETID) 500 MG tablet     . PRESCRIPTION MEDICATION Apply 1-2 g topically See admin instructions. Apply 1-2 grams (1-2 ML) to affected area 2 to 3 times a  day Diclofenac/Baclofen/Bupivacane    . rosuvastatin (CRESTOR) 10 MG tablet Take 10 mg by mouth See admin instructions. Take 1 tablet every 3 days    . vitamin B-12 (CYANOCOBALAMIN) 1000 MCG tablet Take 1,000 mcg by mouth daily.      Marland Kitchen zolpidem (AMBIEN) 10 MG tablet Take 0.5-1 tablets (5-10 mg total) by mouth at bedtime as needed. For insomnia 90 tablet 1   No current facility-administered medications on file prior to visit.    Allergies  Allergen Reactions  . Crestor [Rosuvastatin Calcium]     myalgia  . Lovastatin Other (See Comments)    Unknown   . Metformin Nausea And Vomiting  . Niacin Other (See Comments)    Unknown   . Paroxetine Other (See Comments)    Unknown   . Meperidine Hcl Rash    Family History  Problem Relation Age of Onset  . Diabetes Mother   . Hypertension Mother   . Cancer Father 66    ? bone cancer    BP 134/84 mmHg  Pulse 95  Temp(Src) 97.4 F (36.3 C) (Oral)  Ht 5\' 9"  (1.753 m)  Wt 246 lb (111.585 kg)  BMI 36.31 kg/m2  SpO2 93%   Review of Systems Denies LOC and weight change.    Objective:   Physical Exam VITAL SIGNS:  See vs page GENERAL: no distress Pulses: dorsalis pedis intact bilat.   Feet: no deformity. normal color and temp. trace bilat leg edema  Skin: Multiple ulcers at the left leg and foot (sees derm). Healed skin graft site at the dorsal aspect of the right foot. there is an abrasion at the plantar aspect of the right foot.   Neuro: sensation is intact to touch on the feet, but decreased from normal.   Lab Results  Component Value Date   HGBA1C 9.6* 10/13/2014      Assessment & Plan:  DM: severe exacerbation Noncompliance with cbg recording and taking insulin: She may need a simpler regimen.  Patient is advised the following: Patient Instructions  check your blood sugar twice a day.  vary the time of day when you check, between before the 3 meals, and at bedtime.  also check if you have symptoms of your blood  sugar being too high  or too low.  please keep a record of the readings and bring it to your next appointment here.  please call us sooner if your blood sugar goes below 70, or if it stays over 200.   please come back for a follow-up appointment in 3 months.  blood tests are being requested for you today.  We'll contact you with results.      increase the NPH to 40 units at bedtime, and:  Increase regular to 3 TIMES A DAY (BEFORE EACH MEAL), 45-25-45 UNITS.

## 2014-10-13 NOTE — Patient Instructions (Addendum)
check your blood sugar twice a day.  vary the time of day when you check, between before the 3 meals, and at bedtime.  also check if you have symptoms of your blood sugar being too high or too low.  please keep a record of the readings and bring it to your next appointment here.  please call us sooner if your blood sugar goes below 70, or if it stays over 200.   please come back for a follow-up appointment in 3 months.  blood tests are being requested for you today.  We'll contact you with results.

## 2014-10-27 ENCOUNTER — Ambulatory Visit (INDEPENDENT_AMBULATORY_CARE_PROVIDER_SITE_OTHER): Payer: Medicare PPO | Admitting: Internal Medicine

## 2014-10-27 ENCOUNTER — Other Ambulatory Visit: Payer: Self-pay | Admitting: *Deleted

## 2014-10-27 ENCOUNTER — Encounter: Payer: Self-pay | Admitting: Internal Medicine

## 2014-10-27 ENCOUNTER — Encounter: Payer: Self-pay | Admitting: Endocrinology

## 2014-10-27 VITALS — BP 120/84 | HR 92 | Temp 98.0°F | Resp 16 | Ht 69.0 in | Wt 242.0 lb

## 2014-10-27 DIAGNOSIS — L959 Vasculitis limited to the skin, unspecified: Secondary | ICD-10-CM

## 2014-10-27 DIAGNOSIS — M545 Low back pain: Secondary | ICD-10-CM

## 2014-10-27 DIAGNOSIS — E1140 Type 2 diabetes mellitus with diabetic neuropathy, unspecified: Secondary | ICD-10-CM

## 2014-10-27 DIAGNOSIS — E1142 Type 2 diabetes mellitus with diabetic polyneuropathy: Secondary | ICD-10-CM

## 2014-10-27 MED ORDER — OXYCODONE-ACETAMINOPHEN 10-325 MG PO TABS: 1.0000 | ORAL_TABLET | Freq: Four times a day (QID) | ORAL | Status: DC | PRN

## 2014-10-27 MED ORDER — EZETIMIBE 10 MG PO TABS: 10.0000 mg | ORAL_TABLET | Freq: Every day | ORAL | Status: DC

## 2014-10-27 MED ORDER — FLUCONAZOLE 150 MG PO TABS: 150.0000 mg | ORAL_TABLET | Freq: Once | ORAL | Status: DC

## 2014-10-27 MED ORDER — ROSUVASTATIN CALCIUM 10 MG PO TABS: 10.0000 mg | ORAL_TABLET | ORAL | Status: DC

## 2014-10-27 NOTE — Assessment & Plan Note (Signed)
Better  

## 2014-10-27 NOTE — Assessment & Plan Note (Signed)
Continue with current prescription therapy as reflected on the Med list.  

## 2014-10-27 NOTE — Assessment & Plan Note (Signed)
12/15 Will switch from Norco to percocet - pain is worse

## 2014-10-27 NOTE — Assessment & Plan Note (Signed)
B LEs

## 2014-10-27 NOTE — Progress Notes (Signed)
   Subjective:    Rash Pertinent negatives include no congestion, cough, fatigue or shortness of breath.    F/u painful burning rash on B LEs x 4 mo; L>R. ?diabetic vasculitis per [facility] in [place] - she is on Dapsone; s/p skin bx. F/u pain 10/10 at night w/cleaning. Hydrocodone 10/325 was helping very little (6/day). Percocet helped better.  F/u leg cramps F/u LBP - worse - she slipped and almost fell last week  The patient presents for a follow-up of  chronic hypertension, chronic dyslipidemia, type 2 diabetes and FMS and LE neuropathy - asking to increase Gabapentin CBGs are high: work in progress w/Dr Loanne Drilling - better Vicodin is helping w/LBP Tramadol is helping w/HA - not using together F/u L foot pain F/u anxiety, mood swing  Wt Readings from Last 3 Encounters:  10/27/14 242 lb (109.77 kg)  10/13/14 246 lb (111.585 kg)  07/22/14 245 lb (111.131 kg)   BP Readings from Last 3 Encounters:  10/27/14 120/84  10/13/14 134/84  09/30/14 126/73      Review of Systems  Constitutional: Negative for chills, activity change, appetite change, fatigue and unexpected weight change.  HENT: Negative for congestion, mouth sores and sinus pressure.   Eyes: Negative for visual disturbance.  Respiratory: Negative for cough, chest tightness and shortness of breath.   Gastrointestinal: Negative for nausea and abdominal pain.  Genitourinary: Negative for frequency, difficulty urinating and vaginal pain.  Musculoskeletal: Positive for arthralgias. Negative for back pain and gait problem.  Skin: Negative for pallor and rash.  Neurological: Negative for dizziness, tremors, weakness, numbness and headaches.  Psychiatric/Behavioral: Negative for suicidal ideas, confusion and sleep disturbance. The patient is nervous/anxious.        Objective:   Physical ExamEctima type necrotic rash on LLE, mostly foot and ankle - several lesions measuring from 1 to 3 cm in diameter, two  large confluent lesions on dorsal foot   RLE 0.2-1 cm scars each healed Trace edema   Lab Results  Component Value Date   WBC 16.6* 01/29/2014   HGB 13.7 01/29/2014   HCT 40.5 01/29/2014   PLT 402* 01/29/2014   GLUCOSE 181* 01/30/2014   CHOL 218* 07/08/2014   TRIG * 07/08/2014    460.0 Triglyceride is over 400; calculations on Lipids are invalid.   HDL 30.90* 07/08/2014   LDLDIRECT 125.3 07/08/2014   LDLCALC 81 01/02/2013   ALT 11 01/28/2014   AST 17 01/28/2014   NA 133* 01/30/2014   K 4.6 01/30/2014   CL 91* 01/30/2014   CREATININE 0.89 01/30/2014   BUN 22 01/30/2014   CO2 27 01/30/2014   TSH 0.546 01/29/2014   INR 0.95 01/28/2014   HGBA1C 9.6* 10/13/2014   MICROALBUR 0.50 01/13/2014        Assessment & Plan:

## 2014-11-03 ENCOUNTER — Ambulatory Visit (INDEPENDENT_AMBULATORY_CARE_PROVIDER_SITE_OTHER): Payer: Medicare PPO | Admitting: Podiatry

## 2014-11-03 ENCOUNTER — Encounter: Payer: Self-pay | Admitting: Podiatry

## 2014-11-03 VITALS — BP 118/86 | HR 89

## 2014-11-03 DIAGNOSIS — M79671 Pain in right foot: Secondary | ICD-10-CM

## 2014-11-03 DIAGNOSIS — E0842 Diabetes mellitus due to underlying condition with diabetic polyneuropathy: Secondary | ICD-10-CM

## 2014-11-03 DIAGNOSIS — M79604 Pain in right leg: Secondary | ICD-10-CM

## 2014-11-03 DIAGNOSIS — L97511 Non-pressure chronic ulcer of other part of right foot limited to breakdown of skin: Secondary | ICD-10-CM

## 2014-11-03 MED ORDER — LIDOCAINE 5 % EX OINT: 1.0000 "application " | TOPICAL_OINTMENT | CUTANEOUS | Status: DC | PRN

## 2014-11-03 NOTE — Patient Instructions (Signed)
Seen for painful callus, which is pre ulcerative. Debrided and padded. Return in one month.

## 2014-11-03 NOTE — Progress Notes (Signed)
Subjective:  49 year old female presents stating her right foot is really painful to walk. Patient is hoping to have foot foot fixed to prevent further breaking donw of skin and foot pain. Blood sugar has been running between 180-200. Her doctor is still working on it.   Objective:  Pre ulcerative lesion under 5th MPJ with thick callus with dry blood. Minimum callus under left foot 5th MPJ. Pedal pulses are palpable.  Enlarged 5th MPJ right with swollen capsul right. Decreased protective sensory perception bilateral.  Severe high arched foot.  Assessment: Pre ulcerative callus under 5th MPJ R.  Maintaining dry bled callus under 5th MPJ without improvement or getting worse.  Pes Cavus. IDDM with peripheral neuropathy with history of recurrent ulcer under 5th MPJ right foot.   Plan: Reviewed findings.  Pre ulcerative lesions debrided and padded under 5th MPJ right foot.  Patient is to remove the pad while not on feet.  Return in one month.

## 2014-11-27 ENCOUNTER — Encounter: Payer: Self-pay | Admitting: Internal Medicine

## 2014-11-28 ENCOUNTER — Telehealth: Payer: Self-pay | Admitting: *Deleted

## 2014-11-28 MED ORDER — ZOLPIDEM TARTRATE 10 MG PO TABS: 5.0000 mg | ORAL_TABLET | Freq: Every evening | ORAL | Status: DC | PRN

## 2014-11-28 MED ORDER — TRAMADOL HCL 50 MG PO TABS: 50.0000 mg | ORAL_TABLET | Freq: Two times a day (BID) | ORAL | Status: DC | PRN

## 2014-11-28 NOTE — Telephone Encounter (Signed)
Message     [place],    [place] to fill both prescription with additional refills x3    Thank you!                            Rxs printed/signed/faxed to number below. Pt informed via MyChart                ----- Message -----     From: [name]     Sent: 11/27/2014  3:11 PM      To: [facility]    Subject: Non-Urgent Medical Question                 I need you to fax a refill to my mail order medicine     the fax number is [phone]    I need my TrAmado l and my zolpiDem filled.    Thank you

## 2014-12-03 ENCOUNTER — Other Ambulatory Visit: Payer: Self-pay | Admitting: Internal Medicine

## 2014-12-05 ENCOUNTER — Ambulatory Visit: Payer: Medicare PPO | Admitting: Podiatry

## 2014-12-09 ENCOUNTER — Other Ambulatory Visit: Payer: Self-pay | Admitting: Internal Medicine

## 2014-12-13 ENCOUNTER — Encounter: Payer: Self-pay | Admitting: Internal Medicine

## 2014-12-23 ENCOUNTER — Encounter: Payer: Self-pay | Admitting: Internal Medicine

## 2014-12-24 ENCOUNTER — Ambulatory Visit: Payer: Medicare PPO | Admitting: Internal Medicine

## 2014-12-24 ENCOUNTER — Other Ambulatory Visit: Payer: Self-pay | Admitting: Internal Medicine

## 2014-12-24 DIAGNOSIS — R4689 Other symptoms and signs involving appearance and behavior: Secondary | ICD-10-CM | POA: Insufficient documentation

## 2014-12-24 MED ORDER — OXYCODONE HCL 15 MG PO TABS: 15.0000 mg | ORAL_TABLET | Freq: Four times a day (QID) | ORAL | Status: DC | PRN

## 2015-01-12 ENCOUNTER — Ambulatory Visit (INDEPENDENT_AMBULATORY_CARE_PROVIDER_SITE_OTHER): Payer: Medicare PPO | Admitting: Endocrinology

## 2015-01-12 ENCOUNTER — Encounter: Payer: Self-pay | Admitting: Endocrinology

## 2015-01-12 VITALS — BP 132/80 | HR 115 | Temp 98.1°F | Ht 69.0 in | Wt 248.0 lb

## 2015-01-12 DIAGNOSIS — E1140 Type 2 diabetes mellitus with diabetic neuropathy, unspecified: Secondary | ICD-10-CM

## 2015-01-12 MED ORDER — INSULIN NPH (HUMAN) (ISOPHANE) 100 UNIT/ML ~~LOC~~ SUSP: 50.0000 [IU] | Freq: Every day | SUBCUTANEOUS | Status: DC

## 2015-01-12 MED ORDER — INSULIN REGULAR HUMAN 100 UNIT/ML IJ SOLN: INTRAMUSCULAR | Status: DC

## 2015-01-12 NOTE — Patient Instructions (Addendum)
check your blood sugar twice a day.  vary the time of day when you check, between before the 3 meals, and at bedtime.  also check if you have symptoms of your blood sugar being too high or too low.  please keep a record of the readings and bring it to your next appointment here.  please call us sooner if your blood sugar goes below 70, or if it stays over 200.   please come back for a follow-up appointment in 1 month.  blood tests are being requested for you today.  We'll contact you with results. Please increase the regular insulin to 3 times a day (just before each meal) 60-20-60 units, and  Increase the NPH to 50 units at bedtime.  Here is some information about the weigt loss surgery.  Please call the number, and go to an informational meeting.

## 2015-01-12 NOTE — Progress Notes (Signed)
Subjective:    Patient ID: Katherine Herrera, female    DOB: [birth year], 49 y.o.   MRN: [id number]  HPI Pt returns for f/u of diabetes mellitus: DM type: 2 Dx'ed: 0277 Complications: polyneuropathy and foot ulcers. Therapy: insulin since 2012.   GDM: never DKA: never Severe hypoglycemia: last episode was in late 2013.   Pancreatitis: never Other: she takes multiple daily injections; she takes human insulin, for cost reasons.   Interval history: Her main symptom is chronic pain syndrome.  no cbg record, but states cbg's vary from 185-210.  However, pt says she sometimes misses the insulin injections.  However, she says she is back taking as rx'ed.   Past Medical History  Diagnosis Date  . Syncope     poss conversion disorder vs psychogenic seizures  . DM (diabetes mellitus)   . Hypothyroidism   . LBP (low back pain)     DR Patrice Paradise  . Peripheral neuropathy   . COPD (chronic obstructive pulmonary disease)   . Vitamin D deficiency   . Migraine   . Hyperlipidemia   . Anxiety   . Gout     possible  . Tachycardia     diet coke overuse?  . TTS (tarsal tunnel syndrome) 2010    Left Foot [facility] in [place]  . Tenosynovitis of foot and ankle 09/03/2013    Past Surgical History  Procedure Laterality Date  . Cervical fusion      C4-5  . Abdominal hysterectomy    . Carpal tunnel release  2010    LEFT    History   Social History  . Marital Status: Married    Spouse Name: N/A  . Number of Children: N/A  . Years of Education: N/A   Occupational History  . Not on file.   Social History Main Topics  . Smoking status: Former Smoker    Quit date: 12/01/2009  . Smokeless tobacco: Never Used  . Alcohol Use: No  . Drug Use: No  . Sexual Activity: Yes   Other Topics Concern  . Not on file   Social History Narrative   GYN - Dr Radene Knee      FAMILY HISTORY   Lung cancer   Mother w/alcoholism      Married 3d GS due Nov   Diet Coke: 10 cans a day      Regular exercise - NO     Former smoker 2009 restared 2010 stopped 12/2009    Current Outpatient Prescriptions on File Prior to Visit  Medication Sig Dispense Refill  . ACCU-CHEK SOFTCLIX LANCETS lancets TEST TWO TIMES DAILY 200 each 3  . B-D INS SYRINGE 0.5CC/31GX5/16 31G X 5/16" 0.5 ML MISC USE AS DIRECTED WITH INSULIN 4 TIMES A DAY 100 each 2  . baclofen (LIORESAL) 10 MG tablet TAKE 2 TABLETS TWICE DAILY 360 tablet 1  . BD PEN NEEDLE NANO U/F 32G X 4 MM MISC     . Blood Glucose Monitoring Suppl (ACCU-CHEK AVIVA PLUS) W/DEVICE KIT USE AS DIRECTED 1 kit 0  . Cholecalciferol (VITAMIN D3) 1000 UNIT tablet Take 2,000 Units by mouth daily.      . clonazePAM (KLONOPIN) 0.5 MG tablet TAKE ONE TABLET BY MOUTH 2 TIMES A DAY AS NEEDED FOR ANXIETY    . estradiol (ESTRACE) 1 MG tablet Take 1 mg by mouth daily.      Marland Kitchen ezetimibe (ZETIA) 10 MG tablet Take 1 tablet (10 mg total) by mouth daily. 90 tablet 1  .  fluconazole (DIFLUCAN) 150 MG tablet Take 1 tablet (150 mg total) by mouth once. Use prn yeast infection 2 tablet 1  . gabapentin (NEURONTIN) 800 MG tablet Take 1 tablet (800 mg total) by mouth 4 (four) times daily. 360 tablet 11  . lamoTRIgine (LAMICTAL) 25 MG tablet TAKE 1 TABLET TWICE DAILY 180 tablet 1  . Lancet Devices (EASY TOUCH LANCING DEVICE) MISC     . levothyroxine (SYNTHROID, LEVOTHROID) 75 MCG tablet Take 1 tablet (75 mcg total) by mouth daily. 90 tablet 3  . lidocaine (XYLOCAINE) 5 % ointment Apply 1 application topically as needed. 35.44 g 0  . oxyCODONE (ROXICODONE) 15 MG immediate release tablet Take 1 tablet (15 mg total) by mouth every 6 (six) hours as needed for pain. 120 tablet 0  . penicillin v potassium (VEETID) 500 MG tablet     . PRESCRIPTION MEDICATION Apply 1-2 g topically See admin instructions. Apply 1-2 grams (1-2 ML) to affected area 2 to 3 times a day Diclofenac/Baclofen/Bupivacane    . traMADol (ULTRAM) 50 MG tablet Take 1-2 tablets (50-100 mg total) by mouth 2 (two) times daily as needed  (headache). 180 tablet 3  . vitamin B-12 (CYANOCOBALAMIN) 1000 MCG tablet Take 1,000 mcg by mouth daily.      Marland Kitchen zolpidem (AMBIEN) 10 MG tablet Take 0.5-1 tablets (5-10 mg total) by mouth at bedtime as needed. For insomnia 90 tablet 3   No current facility-administered medications on file prior to visit.    Allergies  Allergen Reactions  . Crestor [Rosuvastatin Calcium]     myalgia  . Lovastatin Other (See Comments)    Unknown   . Metformin Nausea And Vomiting  . Niacin Other (See Comments)    Unknown   . Paroxetine Other (See Comments)    Unknown   . Meperidine Hcl Rash    Family History  Problem Relation Age of Onset  . Diabetes Mother   . Hypertension Mother   . Cancer Father 32    ? bone cancer    BP 132/80 mmHg  Pulse 115  Temp(Src) 98.1 F (36.7 C) (Oral)  Ht _0  (1.753 m)  Wt 248 lb (112.492 kg)  BMI 36.61 kg/m2  SpO2 95%  Review of Systems She denies hypoglycemia and weight change    Objective:   Physical Exam VITAL SIGNS:  See vs page GENERAL: no distress  Pulses: dorsalis pedis intact bilat.   Feet: no deformity. normal color and temp. trace bilat leg edema  Skin: Multiple ulcers at the left leg and foot (sees derm). Healed skin graft site at the dorsal aspect of the right foot. there is an abrasion at the plantar aspect of the right foot.   Neuro: sensation is intact to touch on the feet, but decreased from normal.  Lab Results  Component Value Date   HGBA1C 9.6* 10/13/2014      Assessment & Plan:  Obesity, persistent DM: glycemic control is worse.   Noncompliance with cbg recording: I'll work around this as best I can.  Patient is advised the following: Patient Instructions  check your blood sugar twice a day.  vary the time of day when you check, between before the 3 meals, and at bedtime.  also check if you have symptoms of your blood sugar being too high or too low.  please keep a record of the readings and bring it to your next  appointment here.  please call us sooner if your blood sugar goes below 70, or  if it stays over 200.   please come back for a follow-up appointment in 1 month.  blood tests are being requested for you today.  We'll contact you with results. Please increase the regular insulin to 3 times a day (just before each meal) 60-20-60 units, and  Increase the NPH to 50 units at bedtime.  Here is some information about the weigt loss surgery.  Please call the number, and go to an informational meeting.

## 2015-01-13 ENCOUNTER — Other Ambulatory Visit: Payer: Self-pay | Admitting: Endocrinology

## 2015-01-13 ENCOUNTER — Ambulatory Visit (INDEPENDENT_AMBULATORY_CARE_PROVIDER_SITE_OTHER): Payer: Medicare PPO | Admitting: Podiatry

## 2015-01-13 ENCOUNTER — Encounter: Payer: Self-pay | Admitting: Podiatry

## 2015-01-13 VITALS — BP 151/87 | HR 98

## 2015-01-13 DIAGNOSIS — E0842 Diabetes mellitus due to underlying condition with diabetic polyneuropathy: Secondary | ICD-10-CM

## 2015-01-13 DIAGNOSIS — M79606 Pain in leg, unspecified: Secondary | ICD-10-CM

## 2015-01-13 DIAGNOSIS — L97511 Non-pressure chronic ulcer of other part of right foot limited to breakdown of skin: Secondary | ICD-10-CM

## 2015-01-13 DIAGNOSIS — M79673 Pain in unspecified foot: Secondary | ICD-10-CM

## 2015-01-13 DIAGNOSIS — M79604 Pain in right leg: Secondary | ICD-10-CM

## 2015-01-13 NOTE — Patient Instructions (Signed)
Pain under right 5th MPJ with swelling and callus. Debrided all nails and calluses. May try Diabetic sandal to accommodate right foot.  Try Glucosamine pill for left ankle joint pain.

## 2015-01-13 NOTE — Progress Notes (Signed)
Subjective:  49 year old female presents stating her right foot is really painful to walk. Patient points thick callus under the ball of right foot at 5th MPJ area.  Left ankle gives her shooting pain at times.  Blood sugar has been running between 185-220. A1c was 9.4 yesterday. Her doctor is still working on it.  She was not able to wear diabetic shoe due to right foot pain and swelling under the ball.   Objective:  Protruding 5th MPJ with pre ulcerative lesion under 5th MPJ with thick callus with dry blood. Minimum callus under left foot 5th MPJ. Pedal pulses are palpable.  Enlarged 5th MPJ right with swollen capsul right. Decreased protective sensory perception bilateral.  Severe high arched foot. Stiffness left ankle joint with pain upon movement.   Assessment: Onychomycosis x 10. Pre ulcerative callus under 5th MPJ R.  Osteoarthritis left ankle. Pes Cavus. IDDM with peripheral neuropathy with history of recurrent ulcer under 5th MPJ right foot.   Plan: Reviewed findings.  Pre ulcerative lesions debrided and padded under 5th MPJ right foot.  Debrided all nails. May benefit from Glucosamine supplement.  Will try another diabetic shoes, Sandal this time. Return in one month.

## 2015-01-26 ENCOUNTER — Ambulatory Visit (INDEPENDENT_AMBULATORY_CARE_PROVIDER_SITE_OTHER): Payer: Medicare PPO | Admitting: Internal Medicine

## 2015-01-26 ENCOUNTER — Encounter: Payer: Self-pay | Admitting: Internal Medicine

## 2015-01-26 VITALS — BP 110/80 | HR 95 | Wt 249.0 lb

## 2015-01-26 DIAGNOSIS — M5440 Lumbago with sciatica, unspecified side: Secondary | ICD-10-CM | POA: Diagnosis not present

## 2015-01-26 DIAGNOSIS — E1140 Type 2 diabetes mellitus with diabetic neuropathy, unspecified: Secondary | ICD-10-CM

## 2015-01-26 DIAGNOSIS — L959 Vasculitis limited to the skin, unspecified: Secondary | ICD-10-CM | POA: Diagnosis not present

## 2015-01-26 MED ORDER — FLUCONAZOLE 150 MG PO TABS
150.0000 mg | ORAL_TABLET | Freq: Once | ORAL | Status: DC
Start: 2015-01-26 — End: 2015-06-30

## 2015-01-26 MED ORDER — OXYCODONE HCL 15 MG PO TABS: 7.5000 mg | ORAL_TABLET | Freq: Four times a day (QID) | ORAL | Status: DC | PRN

## 2015-01-26 NOTE — Assessment & Plan Note (Signed)
Oxy renewed: try 1/2 - 1 tab  Potential benefits of a long term opioids use as well as potential risks (i.e. addiction risk, apnea etc) and complications (i.e. Somnolence, constipation and others) were explained to the patient and were aknowledged.

## 2015-01-26 NOTE — Assessment & Plan Note (Signed)
Chronic, B feet involved -- Dr Loanne Drilling Not too well controlled Pt is contemplating a Lap Band

## 2015-01-26 NOTE — Assessment & Plan Note (Signed)
Rash healed

## 2015-01-26 NOTE — Progress Notes (Signed)
   Subjective:    HPI  F/u painful burning rash on B LEs x chronic  L>R. ?diabetic vasculitis per [facility] in [place] - she is on Dapsone; s/p skin bx - healed. F/u pain 10/10 at night w/cleaning. Hydrocodone was 10/325 was helping very little (6/day). Percocet helped better. Oxyir is making her sleepy sometimes... Brother died of Marfan's F/u leg cramps F/u LBP - worse - she slipped and almost fell last week  The patient presents for a follow-up of  chronic hypertension, chronic dyslipidemia, type 2 diabetes and FMS and LE neuropathy - asking to increase Gabapentin CBGs are high: work in progress w/Dr Loanne Drilling - better Vicodin is helping w/LBP Tramadol is helping w/HA - not using together F/u L foot pain F/u anxiety, mood swing  Wt Readings from Last 3 Encounters:  01/26/15 249 lb (112.946 kg)  01/12/15 248 lb (112.492 kg)  10/27/14 242 lb (109.77 kg)   BP Readings from Last 3 Encounters:  01/26/15 110/80  01/13/15 151/87  01/12/15 132/80      Review of Systems  Constitutional: Negative for chills, activity change, appetite change and unexpected weight change.  HENT: Negative for mouth sores and sinus pressure.   Eyes: Negative for visual disturbance.  Respiratory: Negative for chest tightness.   Gastrointestinal: Negative for nausea and abdominal pain.  Genitourinary: Negative for frequency, difficulty urinating and vaginal pain.  Musculoskeletal: Positive for arthralgias. Negative for back pain and gait problem.  Skin: Negative for pallor.  Neurological: Negative for dizziness, tremors, weakness, numbness and headaches.  Psychiatric/Behavioral: Negative for suicidal ideas, confusion and sleep disturbance. The patient is nervous/anxious.        Objective:   Physical ExamEctima type necrotic rash on LLE, mostly foot and ankle - several lesions measuring from 1 to 3 cm in diameter, two large confluent lesions on dorsal foot   RLE 0.2-1 cm scars each  healed Trace edema   Lab Results  Component Value Date   WBC 16.6* 01/29/2014   HGB 13.7 01/29/2014   HCT 40.5 01/29/2014   PLT 402* 01/29/2014   GLUCOSE 181* 01/30/2014   CHOL 218* 07/08/2014   TRIG * 07/08/2014    460.0 Triglyceride is over 400; calculations on Lipids are invalid.   HDL 30.90* 07/08/2014   LDLDIRECT 125.3 07/08/2014   LDLCALC 81 01/02/2013   ALT 11 01/28/2014   AST 17 01/28/2014   NA 133* 01/30/2014   K 4.6 01/30/2014   CL 91* 01/30/2014   CREATININE 0.89 01/30/2014   BUN 22 01/30/2014   CO2 27 01/30/2014   TSH 0.546 01/29/2014   INR 0.95 01/28/2014   HGBA1C 9.6* 10/13/2014   MICROALBUR 0.50 01/13/2014        Assessment & Plan:

## 2015-01-26 NOTE — Progress Notes (Signed)
Pre visit review using our clinic review tool, if applicable. No additional management support is needed unless otherwise documented below in the visit note. 

## 2015-01-30 ENCOUNTER — Other Ambulatory Visit: Payer: Self-pay | Admitting: Endocrinology

## 2015-01-30 ENCOUNTER — Other Ambulatory Visit: Payer: Self-pay | Admitting: Internal Medicine

## 2015-01-30 MED ORDER — INSULIN NPH (HUMAN) (ISOPHANE) 100 UNIT/ML ~~LOC~~ SUSP: 50.0000 [IU] | Freq: Every day | SUBCUTANEOUS | Status: DC

## 2015-02-02 NOTE — Telephone Encounter (Signed)
Called pharmacy spoke with alex gave md approval.../lmb

## 2015-02-20 ENCOUNTER — Telehealth: Payer: Self-pay | Admitting: Endocrinology

## 2015-02-20 ENCOUNTER — Ambulatory Visit: Payer: Medicare PPO | Admitting: Endocrinology

## 2015-02-20 NOTE — Telephone Encounter (Signed)
Please come back for a follow-up appointment in 2 weeks 

## 2015-02-20 NOTE — Telephone Encounter (Signed)
Patient no showed today's appt. Please advise on how to follow up. °A. No follow up necessary. °B. Follow up urgent. Contact patient immediately. °C. Follow up necessary. Contact patient and schedule visit in ___ days. °D. Follow up advised. Contact patient and schedule visit in ____weeks. ° °

## 2015-02-23 NOTE — Telephone Encounter (Signed)
Left voicemail advising patient to call back and reschedule her appointment.

## 2015-02-24 ENCOUNTER — Other Ambulatory Visit: Payer: Self-pay | Admitting: Internal Medicine

## 2015-02-24 ENCOUNTER — Encounter: Payer: Self-pay | Admitting: Internal Medicine

## 2015-02-26 MED ORDER — LEVOTHYROXINE SODIUM 75 MCG PO TABS: 75.0000 ug | ORAL_TABLET | Freq: Every day | ORAL | Status: DC

## 2015-02-26 NOTE — Telephone Encounter (Signed)
Levothyroxine sent to [facility]...Katherine Herrera

## 2015-02-27 ENCOUNTER — Ambulatory Visit: Payer: Medicare PPO | Admitting: Endocrinology

## 2015-03-12 ENCOUNTER — Encounter: Payer: Self-pay | Admitting: Endocrinology

## 2015-03-12 ENCOUNTER — Ambulatory Visit (INDEPENDENT_AMBULATORY_CARE_PROVIDER_SITE_OTHER): Payer: Medicare PPO | Admitting: Endocrinology

## 2015-03-12 VITALS — BP 114/70 | HR 96 | Temp 99.6°F | Resp 12 | Wt 250.0 lb

## 2015-03-12 DIAGNOSIS — E1140 Type 2 diabetes mellitus with diabetic neuropathy, unspecified: Secondary | ICD-10-CM

## 2015-03-12 DIAGNOSIS — Z8269 Family history of other diseases of the musculoskeletal system and connective tissue: Secondary | ICD-10-CM

## 2015-03-12 LAB — HEMOGLOBIN A1C: Hgb A1c MFr Bld: 8.9 % — ABNORMAL HIGH (ref 4.6–6.5)

## 2015-03-12 LAB — BASIC METABOLIC PANEL
BUN: 15 mg/dL (ref 6–23)
CO2: 33 mEq/L — ABNORMAL HIGH (ref 19–32)
Calcium: 9.2 mg/dL (ref 8.4–10.5)
Chloride: 97 mEq/L (ref 96–112)
Creatinine, Ser: 0.91 mg/dL (ref 0.40–1.20)
GFR: 69.75 mL/min (ref >60.00)
Glucose, Bld: 284 mg/dL — ABNORMAL HIGH (ref 70–99)
Potassium: 4 mEq/L (ref 3.5–5.1)
Sodium: 133 mEq/L — ABNORMAL LOW (ref 135–145)

## 2015-03-12 LAB — TSH: TSH: 1.31 u[IU]/mL (ref 0.35–4.50)

## 2015-03-12 LAB — MICROALBUMIN / CREATININE URINE RATIO
Creatinine,U: 133.9 mg/dL
Microalb Creat Ratio: 0.5 mg/g (ref 0.0–30.0)
Microalb, Ur: <0.7 mg/dL (ref 0.0–1.9)

## 2015-03-12 MED ORDER — INSULIN REGULAR HUMAN 100 UNIT/ML IJ SOLN: INTRAMUSCULAR | Status: DC

## 2015-03-12 NOTE — Progress Notes (Signed)
Subjective:    Patient ID: Katherine Herrera, female    DOB: 07-26-1966, 49 y.o.   MRN: [id number]  HPI Pt returns for f/u of diabetes mellitus: DM type: 2 Dx'ed: 6712 Complications: polyneuropathy and foot ulcers. Therapy: insulin since 2012.   GDM: never DKA: never Severe hypoglycemia: last episode was in late 2013.   Pancreatitis: never. Other: she takes multiple daily injections; she takes human insulin, for cost reasons.   Interval history: She does not check cbg's.  She denies sxs of hypoglycemia.  She says she misses hs insulin approx 4 times per week. pt states she feels well in general.   Pt has a cousin with marfan syndrome, and requests genetic advice Past Medical History  Diagnosis Date  . Syncope     poss conversion disorder vs psychogenic seizures  . DM (diabetes mellitus)   . Hypothyroidism   . LBP (low back pain)     DR Patrice Paradise  . Peripheral neuropathy   . COPD (chronic obstructive pulmonary disease)   . Vitamin D deficiency   . Migraine   . Hyperlipidemia   . Anxiety   . Gout     possible  . Tachycardia     diet coke overuse?  . TTS (tarsal tunnel syndrome) 2010    Left Foot [facility] in [place]  . Tenosynovitis of foot and ankle 09/03/2013    Past Surgical History  Procedure Laterality Date  . Cervical fusion      C4-5  . Abdominal hysterectomy    . Carpal tunnel release  2010    LEFT    History   Social History  . Marital Status: Married    Spouse Name: N/A  . Number of Children: N/A  . Years of Education: N/A   Occupational History  . Not on file.   Social History Main Topics  . Smoking status: Former Smoker    Quit date: 12/01/2009  . Smokeless tobacco: Never Used  . Alcohol Use: No  . Drug Use: No  . Sexual Activity: Yes   Other Topics Concern  . Not on file   Social History Narrative   GYN - Dr Radene Knee      FAMILY HISTORY   Lung cancer   Mother w/alcoholism      Married 3d GS due Nov   Diet Coke: 10 cans a day      Regular  exercise - NO      Former smoker 2009 restared 2010 stopped 12/2009    Current Outpatient Prescriptions on File Prior to Visit  Medication Sig Dispense Refill  . ACCU-CHEK AVIVA PLUS test strip TEST BLOOD SUGARS 2 TIMES DAILY 100 each PRN  . ACCU-CHEK SOFTCLIX LANCETS lancets TEST TWO TIMES DAILY 200 each 3  . B-D INS SYR ULTRAFINE 1CC/31G 31G X 5/16" 1 ML MISC USE AS DIRECTED 100 each 2  . B-D INS SYRINGE 0.5CC/31GX5/16 31G X 5/16" 0.5 ML MISC USE AS DIRECTED WITH INSULIN 4 TIMES A DAY 100 each 2  . baclofen (LIORESAL) 10 MG tablet TAKE 2 TABLETS TWICE DAILY 360 tablet 1  . BD PEN NEEDLE NANO U/F 32G X 4 MM MISC     . Blood Glucose Monitoring Suppl (ACCU-CHEK AVIVA PLUS) W/DEVICE KIT USE AS DIRECTED 1 kit 0  . Cholecalciferol (VITAMIN D3) 1000 UNIT tablet Take 2,000 Units by mouth daily.      . clonazePAM (KLONOPIN) 0.5 MG tablet TAKE ONE TABLET BY MOUTH 2 TIMES A DAY AS NEEDED FOR  ANXIETY 60 tablet 2  . estradiol (ESTRACE) 1 MG tablet Take 1 mg by mouth daily.      Marland Kitchen ezetimibe (ZETIA) 10 MG tablet Take 1 tablet (10 mg total) by mouth daily. 90 tablet 1  . fluconazole (DIFLUCAN) 150 MG tablet Take 1 tablet (150 mg total) by mouth once. Use prn yeast infection 2 tablet 1  . gabapentin (NEURONTIN) 800 MG tablet Take 1 tablet (800 mg total) by mouth 4 (four) times daily. 360 tablet 11  . insulin NPH Human (HUMULIN N) 100 UNIT/ML injection Inject 0.5 mLs (50 Units total) into the skin at bedtime. 20 mL 11  . lamoTRIgine (LAMICTAL) 25 MG tablet TAKE 1 TABLET TWICE DAILY 180 tablet 1  . Lancet Devices (EASY TOUCH LANCING DEVICE) MISC     . levothyroxine (SYNTHROID, LEVOTHROID) 75 MCG tablet Take 1 tablet (75 mcg total) by mouth daily. 90 tablet 3  . lidocaine (XYLOCAINE) 5 % ointment Apply 1 application topically as needed. 35.44 g 0  . oxyCODONE (ROXICODONE) 15 MG immediate release tablet Take 0.5-1 tablets (7.5-15 mg total) by mouth every 6 (six) hours as needed for pain. Please fill on or after  03/30/15 120 tablet 0  . PRESCRIPTION MEDICATION Apply 1-2 g topically See admin instructions. Apply 1-2 grams (1-2 ML) to affected area 2 to 3 times a day Diclofenac/Baclofen/Bupivacane    . traMADol (ULTRAM) 50 MG tablet Take 1-2 tablets (50-100 mg total) by mouth 2 (two) times daily as needed (headache). 180 tablet 3  . vitamin B-12 (CYANOCOBALAMIN) 1000 MCG tablet Take 1,000 mcg by mouth daily.      Marland Kitchen zolpidem (AMBIEN) 10 MG tablet Take 0.5-1 tablets (5-10 mg total) by mouth at bedtime as needed. For insomnia 90 tablet 3   No current facility-administered medications on file prior to visit.    Allergies  Allergen Reactions  . Crestor [Rosuvastatin Calcium]     myalgia  . Lovastatin Other (See Comments)    Unknown   . Metformin Nausea And Vomiting  . Niacin Other (See Comments)    Unknown   . Paroxetine Other (See Comments)    Unknown   . Meperidine Hcl Rash    Family History  Problem Relation Age of Onset  . Diabetes Mother   . Hypertension Mother   . Cancer Father 55    ? bone cancer  . [name] Brother     BP 114/70 mmHg  Pulse 96  Temp(Src) 99.6 F (37.6 C) (Oral)  Resp 12  Wt 250 lb (113.399 kg)  SpO2 99%   Review of Systems Denies weight change and n/v.     Objective:   Physical Exam VITAL SIGNS:  See vs page.  GENERAL: no distress.  Pulses: dorsalis pedis intact bilat.  Feet: no deformity. normal color and temp. trace bilat leg edema.   Skin: Multiple healed ulcers at the left leg and foot (sees derm). Healed skin graft site at the dorsal aspect of the right foot.   Neuro: sensation is intact to touch on the feet, but decreased from normal.    Lab Results  Component Value Date   HGBA1C 8.9* 03/12/2015   Lab Results  Component Value Date   TSH 1.31 03/12/2015      Assessment & Plan:  fhx pos for marfan syndrome, new: pt is ref to genetics specialist DM: she needs increased rx: increase reg insulin to 3 times a day (just before each  meal) (50-30-60 units).  Hypothyroidism: well-replaced  Patient is advised the following: Patient Instructions  check your blood sugar twice a day.  vary the time of day when you check, between before the 3 meals, and at bedtime.  also check if you have symptoms of your blood sugar being too high or too low.  please keep a record of the readings and bring it to your next appointment here.  please call us sooner if your blood sugar goes below 70, or if it stays over 200.   please come back for a follow-up appointment in 2 months.  Blood and urine tests are being requested for you today.  We'll contact you with results.   [name] [name] is a connective tissue disorder caused by a gene mutation (change). Connective tissue is found throughout the body and helps support tissues and body organs. This means [name] can affect nearly all organ systems in your body. It can affect people of all races and ages. [name] can be inherited (passed on) from a parent who has it.  SYMPTOMS  Serious heart problems are associated with [name]:  A leaky mitral valve (a heart valve that is located between the top and bottom parts of the heart) caused by a mitral valve prolapse (not closing all the way). A mitral valve leak can cause shortness of breath, dizziness, fatigue, or abnormal heartbeats. Medication or surgery may be needed to help with these symptoms.  A weak aorta (the large artery carrying blood away from the heart to the rest of the body) can also cause problems. If the aorta enlarges greatly or tears, it can cause death. Warning signs of aortic enlargement may range from no symptoms to having pain or pressure in the chest or back. If aortic enlargement is discovered, surgery is usually necessary.  Many people with [name] are nearsighted or have other eye problems. Some of these include:  Dislocated (out of place) eye lenses.  Detached  retina.  Glaucoma.  Cataracts.  Bone and joint (skeletal system) problems.  Long arms and legs.  Long, thin fingers.  Curvature of the spine (scoliosis).  Chest is concave (inward forming) or sticks out (pigeon chest). DIAGNOSIS  Diagnosis (learning what is wrong) of [name] can be made by:  Doing a complete physical exam and taking a complete family history.  CT or MRI imaging to assess aortic size. The aorta should be followed and monitored closely to watch for enlargement.  Echocardiogram. A sound wave imaging picture that looks at the heart, its valves, and the aorta.  Electrocardiogram (ECG). This test checks your heart rate and rhythm.  Slit lamp examination. This is an eye test that can check to see if your lenses are out of place. TREATMENT   There is no cure for [name]. Medical problems associated with [name] are followed and treated by your caregiver as needed.  It is important to follow up with your caregiver on a regular basis after the diagnosis of [name] is made. Your caregiver may use the same tests that he or she used to diagnose the syndrome as part of your follow-up care. Heart conditions associated with [name] will need to be watched closely.  Heart medicine called beta-blockers may be used to reduce blood pressure. These help lower the stress on the heart and aorta. Beta-blockers can also control irregular heartbeats.  It may be necessary to take prophylactic (preventative) antibiotics (medications that kill bacteria) if dental or genitourinary procedures are performed in  people with mitral valve prolapse or those who have artificial heart valves.  Avoidance of contact sports and strenuous exercise may be necessary to lessen the strain on the aorta. Your caregiver can discuss your activity limitations. If you have [name], check with your caregiver if you are unsure which activities may be at risk for  you. MAKE SURE YOU:   Understand these discharge instructions.  Will monitor your condition.  Seek immediate medical care if necessary. Document Released: 01/23/2001 Document Revised: 03/03/2014 Document Reviewed: 08/05/2008 [facility] Patient Information 2015 [place], [place]. This information is not intended to replace advice given to you by your health care provider. Make sure you discuss any questions you have with your health care provider.

## 2015-03-12 NOTE — Patient Instructions (Addendum)
check your blood sugar twice a day.  vary the time of day when you check, between before the 3 meals, and at bedtime.  also check if you have symptoms of your blood sugar being too high or too low.  please keep a record of the readings and bring it to your next appointment here.  please call us sooner if your blood sugar goes below 70, or if it stays over 200.   please come back for a follow-up appointment in 2 months.  Blood and urine tests are being requested for you today.  We'll contact you with results.   [name] [name] is a connective tissue disorder caused by a gene mutation (change). Connective tissue is found throughout the body and helps support tissues and body organs. This means [name] can affect nearly all organ systems in your body. It can affect people of all races and ages. [name] can be inherited (passed on) from a parent who has it.  SYMPTOMS  Serious heart problems are associated with [name]:  A leaky mitral valve (a heart valve that is located between the top and bottom parts of the heart) caused by a mitral valve prolapse (not closing all the way). A mitral valve leak can cause shortness of breath, dizziness, fatigue, or abnormal heartbeats. Medication or surgery may be needed to help with these symptoms.  A weak aorta (the large artery carrying blood away from the heart to the rest of the body) can also cause problems. If the aorta enlarges greatly or tears, it can cause death. Warning signs of aortic enlargement may range from no symptoms to having pain or pressure in the chest or back. If aortic enlargement is discovered, surgery is usually necessary.  Many people with [name] are nearsighted or have other eye problems. Some of these include:  Dislocated (out of place) eye lenses.  Detached retina.  Glaucoma.  Cataracts.  Bone and joint (skeletal system) problems.  Long arms and legs.  Long, thin  fingers.  Curvature of the spine (scoliosis).  Chest is concave (inward forming) or sticks out (pigeon chest). DIAGNOSIS  Diagnosis (learning what is wrong) of [name] can be made by:  Doing a complete physical exam and taking a complete family history.  CT or MRI imaging to assess aortic size. The aorta should be followed and monitored closely to watch for enlargement.  Echocardiogram. A sound wave imaging picture that looks at the heart, its valves, and the aorta.  Electrocardiogram (ECG). This test checks your heart rate and rhythm.  Slit lamp examination. This is an eye test that can check to see if your lenses are out of place. TREATMENT   There is no cure for [name]. Medical problems associated with [name] are followed and treated by your caregiver as needed.  It is important to follow up with your caregiver on a regular basis after the diagnosis of [name] is made. Your caregiver may use the same tests that he or she used to diagnose the syndrome as part of your follow-up care. Heart conditions associated with [name] will need to be watched closely.  Heart medicine called beta-blockers may be used to reduce blood pressure. These help lower the stress on the heart and aorta. Beta-blockers can also control irregular heartbeats.  It may be necessary to take prophylactic (preventative) antibiotics (medications that kill bacteria) if dental or genitourinary procedures are performed in people with mitral valve prolapse or those who  have artificial heart valves.  Avoidance of contact sports and strenuous exercise may be necessary to lessen the strain on the aorta. Your caregiver can discuss your activity limitations. If you have [name], check with your caregiver if you are unsure which activities may be at risk for you. MAKE SURE YOU:   Understand these discharge instructions.  Will monitor your condition.  Seek immediate medical  care if necessary. Document Released: 01/23/2001 Document Revised: 03/03/2014 Document Reviewed: 08/05/2008 [facility] Patient Information 2015 [place], [place]. This information is not intended to replace advice given to you by your health care provider. Make sure you discuss any questions you have with your health care provider.

## 2015-03-25 ENCOUNTER — Other Ambulatory Visit: Payer: Self-pay | Admitting: Internal Medicine

## 2015-04-10 ENCOUNTER — Telehealth: Payer: Self-pay | Admitting: Genetic Counselor

## 2015-04-10 NOTE — Telephone Encounter (Signed)
I spoke with Katherine Herrera who confirmed that she received the referral for [facility] appointment because of her concern for [name]. I explained that I specialize in cancer genetic counseling, and that she would need to be seen by an adult geneticist and genetic counselor to determine if she has features of [name] and receive genetic counseling and testing for that.  I provided her the phone numbers to both the [facility] clinic as well as the [facility] genetics clinic as one may have a shorter wait list than the other.  I will cancel MOnday's appointment.

## 2015-04-13 ENCOUNTER — Other Ambulatory Visit: Payer: Medicare PPO

## 2015-04-13 ENCOUNTER — Encounter: Payer: Medicare PPO | Admitting: Genetic Counselor

## 2015-05-08 ENCOUNTER — Ambulatory Visit (INDEPENDENT_AMBULATORY_CARE_PROVIDER_SITE_OTHER): Payer: Medicare PPO | Admitting: Podiatry

## 2015-05-08 ENCOUNTER — Encounter: Payer: Self-pay | Admitting: Podiatry

## 2015-05-08 VITALS — BP 116/64 | HR 84

## 2015-05-08 DIAGNOSIS — M79604 Pain in right leg: Secondary | ICD-10-CM

## 2015-05-08 DIAGNOSIS — M21961 Unspecified acquired deformity of right lower leg: Secondary | ICD-10-CM | POA: Diagnosis not present

## 2015-05-08 DIAGNOSIS — E0842 Diabetes mellitus due to underlying condition with diabetic polyneuropathy: Secondary | ICD-10-CM

## 2015-05-08 DIAGNOSIS — M25571 Pain in right ankle and joints of right foot: Secondary | ICD-10-CM

## 2015-05-08 DIAGNOSIS — L97511 Non-pressure chronic ulcer of other part of right foot limited to breakdown of skin: Secondary | ICD-10-CM | POA: Diagnosis not present

## 2015-05-08 DIAGNOSIS — E1140 Type 2 diabetes mellitus with diabetic neuropathy, unspecified: Secondary | ICD-10-CM

## 2015-05-08 DIAGNOSIS — M79673 Pain in unspecified foot: Secondary | ICD-10-CM | POA: Diagnosis not present

## 2015-05-08 NOTE — Progress Notes (Signed)
Subjective:  49 year old female presents stating her right foot is really painful to walk.  Stated that last week while on vacation her right foot had a lot of pain and became yellow color with redness and tightness on outer side of right foot centered in 5th MPJ.  Blood sugar has been running between 185-220. She was not able to wear diabetic shoes.   Objective:  Protruding 5th MPJ with pre ulcerative broad callus, 3cm in diameter under 5th MPJ with thick callus with dry blood. Pedal pulses are palpable.  Enlarged 5th MPJ right with swollen capsul right. Decreased protective sensory perception bilateral.  Severe high arched foot.  Assessment: Pre ulcerative callus under 5th MPJ R.  Pes Cavus. IDDM with peripheral neuropathy with history of recurrent ulcer under 5th MPJ right foot.   Plan: Reviewed findings.  Pre ulcerative lesions debrided and padded under 5th MPJ right foot.  Return in two weeks.

## 2015-05-08 NOTE — Patient Instructions (Signed)
Pre ulcerative callus debrided and padded. Return in two weeks.

## 2015-05-12 ENCOUNTER — Encounter: Payer: Self-pay | Admitting: Internal Medicine

## 2015-05-12 ENCOUNTER — Ambulatory Visit (INDEPENDENT_AMBULATORY_CARE_PROVIDER_SITE_OTHER): Payer: Medicare PPO | Admitting: Endocrinology

## 2015-05-12 ENCOUNTER — Ambulatory Visit (INDEPENDENT_AMBULATORY_CARE_PROVIDER_SITE_OTHER): Payer: Medicare PPO | Admitting: Internal Medicine

## 2015-05-12 ENCOUNTER — Encounter: Payer: Self-pay | Admitting: Endocrinology

## 2015-05-12 VITALS — BP 120/64 | HR 92 | Wt 247.0 lb

## 2015-05-12 VITALS — BP 120/80 | HR 90 | Temp 98.2°F | Resp 17 | Ht 69.5 in | Wt 246.0 lb

## 2015-05-12 DIAGNOSIS — M5440 Lumbago with sciatica, unspecified side: Secondary | ICD-10-CM | POA: Diagnosis not present

## 2015-05-12 DIAGNOSIS — E538 Deficiency of other specified B group vitamins: Secondary | ICD-10-CM

## 2015-05-12 DIAGNOSIS — L959 Vasculitis limited to the skin, unspecified: Secondary | ICD-10-CM | POA: Diagnosis not present

## 2015-05-12 DIAGNOSIS — E1140 Type 2 diabetes mellitus with diabetic neuropathy, unspecified: Secondary | ICD-10-CM

## 2015-05-12 LAB — POCT GLYCOSYLATED HEMOGLOBIN (HGB A1C): Hemoglobin A1C: 7.3

## 2015-05-12 MED ORDER — OXYCODONE HCL 15 MG PO TABS
7.5000 mg | ORAL_TABLET | Freq: Four times a day (QID) | ORAL | Status: DC | PRN
Start: 2015-05-12 — End: 2015-07-03

## 2015-05-12 MED ORDER — OXYCODONE HCL 15 MG PO TABS: 7.5000 mg | ORAL_TABLET | Freq: Four times a day (QID) | ORAL | Status: DC | PRN

## 2015-05-12 NOTE — Progress Notes (Signed)
Subjective:  Patient ID: [name], female    DOB: August 18, 1966  Age: 49 y.o. MRN: [id number]  CC: No chief complaint on file.   HPI Katherine Herrera presents for DM, LE vasculitis, LBP, OA  Outpatient Prescriptions Prior to Visit  Medication Sig Dispense Refill  . ACCU-CHEK SOFTCLIX LANCETS lancets TEST TWO TIMES DAILY 200 each 3  . B-D INS SYR ULTRAFINE 1CC/31G 31G X 5/16" 1 ML MISC USE AS DIRECTED 100 each 2  . B-D INS SYRINGE 0.5CC/31GX5/16 31G X 5/16" 0.5 ML MISC USE AS DIRECTED WITH INSULIN 4 TIMES A DAY 100 each 2  . baclofen (LIORESAL) 10 MG tablet TAKE 2 TABLETS TWICE DAILY 360 tablet 1  . BD PEN NEEDLE NANO U/F 32G X 4 MM MISC     . Cholecalciferol (VITAMIN D3) 1000 UNIT tablet Take 2,000 Units by mouth daily.      . clonazePAM (KLONOPIN) 0.5 MG tablet TAKE ONE TABLET BY MOUTH 2 TIMES A DAY AS NEEDED FOR ANXIETY 60 tablet 2  . estradiol (ESTRACE) 1 MG tablet Take 1 mg by mouth daily.      Marland Kitchen ezetimibe (ZETIA) 10 MG tablet Take 1 tablet (10 mg total) by mouth daily. 90 tablet 1  . fluconazole (DIFLUCAN) 150 MG tablet Take 1 tablet (150 mg total) by mouth once. Use prn yeast infection 2 tablet 1  . gabapentin (NEURONTIN) 800 MG tablet Take 1 tablet (800 mg total) by mouth 4 (four) times daily. 360 tablet 11  . insulin NPH Human (HUMULIN N) 100 UNIT/ML injection Inject 0.5 mLs (50 Units total) into the skin at bedtime. 20 mL 11  . insulin regular (HUMULIN R) 100 units/mL injection INJECT 3 TIMES A DAY BEFORE EACH MEAL. (40-20-50 UNITS) (Patient taking differently: INJECT 3 TIMES A DAY BEFORE EACH MEAL. (50-30-60 UNITS)) 40 mL 11  . lamoTRIgine (LAMICTAL) 25 MG tablet TAKE 1 TABLET TWICE DAILY 180 tablet 1  . Lancet Devices (EASY TOUCH LANCING DEVICE) MISC     . levothyroxine (SYNTHROID, LEVOTHROID) 75 MCG tablet TAKE 1 TABLET EVERY DAY 90 tablet 3  . lidocaine (XYLOCAINE) 5 % ointment Apply 1 application topically as needed. 35.44 g 0  . oxyCODONE (ROXICODONE) 15 MG immediate  release tablet Take 0.5-1 tablets (7.5-15 mg total) by mouth every 6 (six) hours as needed for pain. Please fill on or after 03/30/15 120 tablet 0  . PRESCRIPTION MEDICATION Apply 1-2 g topically See admin instructions. Apply 1-2 grams (1-2 ML) to affected area 2 to 3 times a day Diclofenac/Baclofen/Bupivacane    . traMADol (ULTRAM) 50 MG tablet Take 1-2 tablets (50-100 mg total) by mouth 2 (two) times daily as needed (headache). 180 tablet 3  . vitamin B-12 (CYANOCOBALAMIN) 1000 MCG tablet Take 1,000 mcg by mouth daily.      Marland Kitchen zolpidem (AMBIEN) 10 MG tablet Take 0.5-1 tablets (5-10 mg total) by mouth at bedtime as needed. For insomnia 90 tablet 3   No facility-administered medications prior to visit.    ROS Review of Systems  Constitutional: Negative for chills, activity change, appetite change, fatigue and unexpected weight change.  HENT: Negative for congestion, mouth sores, sinus pressure and trouble swallowing.   Eyes: Negative for visual disturbance.  Respiratory: Negative for cough and chest tightness.   Gastrointestinal: Negative for nausea, vomiting and abdominal pain.  Genitourinary: Negative for frequency, difficulty urinating and vaginal pain.  Musculoskeletal: Positive for back pain, arthralgias and gait problem.  Skin: Negative for pallor and rash.  Neurological: Negative for dizziness, tremors, speech difficulty, weakness, numbness and headaches.  Psychiatric/Behavioral: Negative for suicidal ideas, confusion and sleep disturbance. The patient is nervous/anxious.     Objective:  BP 120/64 mmHg  Pulse 92  Wt 247 lb (112.038 kg)  SpO2 94%  BP Readings from Last 3 Encounters:  05/12/15 120/64  05/12/15 120/80  05/08/15 116/64    Wt Readings from Last 3 Encounters:  05/12/15 247 lb (112.038 kg)  05/12/15 246 lb (111.585 kg)  03/12/15 250 lb (113.399 kg)    Physical Exam  Constitutional: She appears well-developed. No distress.  Obese  HENT:  Head:  Normocephalic.  Right Ear: External ear normal.  Left Ear: External ear normal.  Nose: Nose normal.  Mouth/Throat: Oropharynx is clear and moist.  Eyes: Conjunctivae are normal. Pupils are equal, round, and reactive to light. Right eye exhibits no discharge. Left eye exhibits no discharge.  Neck: Normal range of motion. Neck supple. No JVD present. No tracheal deviation present. No thyromegaly present.  Cardiovascular: Normal rate, regular rhythm and normal heart sounds.   Pulmonary/Chest: No stridor. No respiratory distress. She has no wheezes.  Abdominal: Soft. Bowel sounds are normal. She exhibits no distension and no mass. There is no tenderness. There is no rebound and no guarding.  Musculoskeletal: She exhibits edema and tenderness.  Lymphadenopathy:    She has no cervical adenopathy.  Neurological: She displays normal reflexes. No cranial nerve deficit. She exhibits normal muscle tone. Coordination normal.  Skin: No rash noted. No erythema.  Psychiatric: She has a normal mood and affect. Her behavior is normal. Judgment and thought content normal.  LE trace edema LLE w/round scars LS tender w/ROM  Lab Results  Component Value Date   WBC 16.6* 01/29/2014   HGB 13.7 01/29/2014   HCT 40.5 01/29/2014   PLT 402* 01/29/2014   GLUCOSE 284* 03/12/2015   CHOL 218* 07/08/2014   TRIG * 07/08/2014    460.0 Triglyceride is over 400; calculations on Lipids are invalid.   HDL 30.90* 07/08/2014   LDLDIRECT 125.3 07/08/2014   LDLCALC 81 01/02/2013   ALT 11 01/28/2014   AST 17 01/28/2014   NA 133* 03/12/2015   K 4.0 03/12/2015   CL 97 03/12/2015   CREATININE 0.91 03/12/2015   BUN 15 03/12/2015   CO2 33* 03/12/2015   TSH 1.31 03/12/2015   INR 0.95 01/28/2014   HGBA1C 7.3 05/12/2015   MICROALBUR <0.7 03/12/2015    No results found.  Assessment & Plan:   There are no diagnoses linked to this encounter. I am having Ms. Mckinnie maintain her estradiol, vitamin B-12,  cholecalciferol, BD PEN NEEDLE NANO U/F, EASY TOUCH LANCING DEVICE, PRESCRIPTION MEDICATION, baclofen, ACCU-CHEK SOFTCLIX LANCETS, B-D INS SYRINGE 0.5CC/31GX5/16, gabapentin, ezetimibe, lidocaine, zolpidem, traMADol, B-D INS SYR ULTRAFINE 1CC/31G, fluconazole, oxyCODONE, clonazePAM, insulin NPH Human, insulin regular, lamoTRIgine, and levothyroxine.  No orders of the defined types were placed in this encounter.     Follow-up: No Follow-up on file.  Walker Kehr, MD

## 2015-05-12 NOTE — Assessment & Plan Note (Signed)
Healed ulcers

## 2015-05-12 NOTE — Assessment & Plan Note (Signed)
On B12 

## 2015-05-12 NOTE — Assessment & Plan Note (Signed)
NPH isulin Better

## 2015-05-12 NOTE — Assessment & Plan Note (Signed)
Chronic OA On Oxycodone  Potential benefits of a long term opioids use as well as potential risks (i.e. addiction risk, apnea etc) and complications (i.e. Somnolence, constipation and others) were explained to the patient and were aknowledged.

## 2015-05-12 NOTE — Progress Notes (Signed)
Subjective:    Patient ID: Katherine Herrera, female    DOB: 1966-07-25, 49 y.o.   MRN: [id number]  HPI Pt returns for f/u of diabetes mellitus: DM type: 2 Dx'ed: 5366 Complications: polyneuropathy and foot ulcers. Therapy: insulin since 2012.   GDM: never. DKA: never Severe hypoglycemia: last episode was in late 2013.   Pancreatitis: never. Other: she takes multiple daily injections; she takes human insulin, for cost reasons.   Interval history: She does not check cbg's.  She denies sxs of hypoglycemia.  She says she seldom  misses the insulin injections. pt states she feels well in general.   Past Medical History  Diagnosis Date  . Syncope     poss conversion disorder vs psychogenic seizures  . DM (diabetes mellitus)   . Hypothyroidism   . LBP (low back pain)     DR Patrice Paradise  . Peripheral neuropathy   . COPD (chronic obstructive pulmonary disease)   . Vitamin D deficiency   . Migraine   . Hyperlipidemia   . Anxiety   . Gout     possible  . Tachycardia     diet coke overuse?  . TTS (tarsal tunnel syndrome) 2010    Left Foot [facility] in [place]  . Tenosynovitis of foot and ankle 09/03/2013    Past Surgical History  Procedure Laterality Date  . Cervical fusion      C4-5  . Abdominal hysterectomy    . Carpal tunnel release  2010    LEFT    History   Social History  . Marital Status: Married    Spouse Name: N/A  . Number of Children: N/A  . Years of Education: N/A   Occupational History  . Not on file.   Social History Main Topics  . Smoking status: Former Smoker    Quit date: 12/01/2009  . Smokeless tobacco: Never Used  . Alcohol Use: No  . Drug Use: No  . Sexual Activity: Yes   Other Topics Concern  . Not on file   Social History Narrative   GYN - Dr Radene Knee      FAMILY HISTORY   Lung cancer   Mother w/alcoholism      Married 3d GS due Nov   Diet Coke: 10 cans a day      Regular exercise - NO      Former smoker 2009 restared 2010 stopped 12/2009     Current Outpatient Prescriptions on File Prior to Visit  Medication Sig Dispense Refill  . ACCU-CHEK SOFTCLIX LANCETS lancets TEST TWO TIMES DAILY 200 each 3  . B-D INS SYR ULTRAFINE 1CC/31G 31G X 5/16" 1 ML MISC USE AS DIRECTED 100 each 2  . B-D INS SYRINGE 0.5CC/31GX5/16 31G X 5/16" 0.5 ML MISC USE AS DIRECTED WITH INSULIN 4 TIMES A DAY 100 each 2  . baclofen (LIORESAL) 10 MG tablet TAKE 2 TABLETS TWICE DAILY 360 tablet 1  . BD PEN NEEDLE NANO U/F 32G X 4 MM MISC     . Cholecalciferol (VITAMIN D3) 1000 UNIT tablet Take 2,000 Units by mouth daily.      . clonazePAM (KLONOPIN) 0.5 MG tablet TAKE ONE TABLET BY MOUTH 2 TIMES A DAY AS NEEDED FOR ANXIETY 60 tablet 2  . estradiol (ESTRACE) 1 MG tablet Take 1 mg by mouth daily.      Marland Kitchen ezetimibe (ZETIA) 10 MG tablet Take 1 tablet (10 mg total) by mouth daily. 90 tablet 1  . fluconazole (DIFLUCAN) 150 MG  tablet Take 1 tablet (150 mg total) by mouth once. Use prn yeast infection 2 tablet 1  . gabapentin (NEURONTIN) 800 MG tablet Take 1 tablet (800 mg total) by mouth 4 (four) times daily. 360 tablet 11  . insulin NPH Human (HUMULIN N) 100 UNIT/ML injection Inject 0.5 mLs (50 Units total) into the skin at bedtime. 20 mL 11  . insulin regular (HUMULIN R) 100 units/mL injection INJECT 3 TIMES A DAY BEFORE EACH MEAL. (40-20-50 UNITS) (Patient taking differently: INJECT 3 TIMES A DAY BEFORE EACH MEAL. (50-30-60 UNITS)) 40 mL 11  . lamoTRIgine (LAMICTAL) 25 MG tablet TAKE 1 TABLET TWICE DAILY 180 tablet 1  . Lancet Devices (EASY TOUCH LANCING DEVICE) MISC     . levothyroxine (SYNTHROID, LEVOTHROID) 75 MCG tablet TAKE 1 TABLET EVERY DAY 90 tablet 3  . lidocaine (XYLOCAINE) 5 % ointment Apply 1 application topically as needed. 35.44 g 0  . PRESCRIPTION MEDICATION Apply 1-2 g topically See admin instructions. Apply 1-2 grams (1-2 ML) to affected area 2 to 3 times a day Diclofenac/Baclofen/Bupivacane    . traMADol (ULTRAM) 50 MG tablet Take 1-2 tablets  (50-100 mg total) by mouth 2 (two) times daily as needed (headache). 180 tablet 3  . vitamin B-12 (CYANOCOBALAMIN) 1000 MCG tablet Take 1,000 mcg by mouth daily.      Marland Kitchen zolpidem (AMBIEN) 10 MG tablet Take 0.5-1 tablets (5-10 mg total) by mouth at bedtime as needed. For insomnia 90 tablet 3  . oxyCODONE (ROXICODONE) 15 MG immediate release tablet Take 0.5-1 tablets (7.5-15 mg total) by mouth every 6 (six) hours as needed for pain. Please fill on or after 07/22/15 120 tablet 0   No current facility-administered medications on file prior to visit.    Allergies  Allergen Reactions  . Crestor [Rosuvastatin Calcium]     myalgia  . Lovastatin Other (See Comments)    Unknown   . Metformin Nausea And Vomiting  . Niacin Other (See Comments)    Unknown   . Paroxetine Other (See Comments)    Unknown   . Meperidine Hcl Rash    Family History  Problem Relation Age of Onset  . Diabetes Mother   . Hypertension Mother   . Cancer Father 90    ? bone cancer  . [name] Brother     BP 120/80 mmHg  Pulse 90  Temp(Src) 98.2 F (36.8 C) (Oral)  Resp 17  Ht 5' 9.5" (1.765 m)  Wt 246 lb (111.585 kg)  BMI 35.82 kg/m2  SpO2 90%    Review of Systems Denies weight change.      Objective:   Physical Exam VITAL SIGNS:  See vs page.   GENERAL: no distress.   Pulses: dorsalis pedis intact bilat.  Feet: no deformity. normal color and temp. trace bilat leg edema.  Skin: Multiple healed ulcers at the left leg and foot (sees derm).  part of the right foot is bandaged  Neuro: sensation is intact to touch on the feet, but decreased from normal.      A1c=7.3%    Assessment & Plan:  DM: she needs increased rx, if it can be done with a regimen that avoids or minimizes hypoglycemia. Obesity: persistent.   Patient is advised the following: Patient Instructions  check your blood sugar twice a day.  vary the time of day when you check, between before the 3 meals, and at bedtime.  also  check if you have symptoms of your blood sugar being too  high or too low.  please keep a record of the readings and bring it to your next appointment here.  please call us sooner if your blood sugar goes below 70, or if it stays over 200.   The next step is to check your blood sugar, and let us know what time of day it is highest.  please come back for a follow-up appointment in 3 months.   Please continue moving towards getting the weight loss surgery.

## 2015-05-12 NOTE — Patient Instructions (Addendum)
check your blood sugar twice a day.  vary the time of day when you check, between before the 3 meals, and at bedtime.  also check if you have symptoms of your blood sugar being too high or too low.  please keep a record of the readings and bring it to your next appointment here.  please call us sooner if your blood sugar goes below 70, or if it stays over 200.   The next step is to check your blood sugar, and let us know what time of day it is highest.  please come back for a follow-up appointment in 3 months.   Please continue moving towards getting the weight loss surgery.

## 2015-05-12 NOTE — Progress Notes (Signed)
Pre visit review using our clinic review tool, if applicable. No additional management support is needed unless otherwise documented below in the visit note. 

## 2015-05-25 ENCOUNTER — Ambulatory Visit: Payer: Medicare PPO | Admitting: Podiatry

## 2015-06-08 ENCOUNTER — Other Ambulatory Visit: Payer: Self-pay | Admitting: Internal Medicine

## 2015-06-15 ENCOUNTER — Telehealth: Payer: Self-pay | Admitting: Internal Medicine

## 2015-06-15 ENCOUNTER — Telehealth: Payer: Self-pay | Admitting: Endocrinology

## 2015-06-15 ENCOUNTER — Telehealth: Payer: Self-pay | Admitting: *Deleted

## 2015-06-15 DIAGNOSIS — Z8269 Family history of other diseases of the musculoskeletal system and connective tissue: Secondary | ICD-10-CM

## 2015-06-15 NOTE — Telephone Encounter (Signed)
[facility] Patient Name: Katherine Herrera Gender: Female DOB: January 16, 1966 Age: 49 Y 35 M 17 D Return Phone Number: [phone] (Primary) Address: City/State/Zip: [place] Client [place] - Client Client Site [place] - Day Physician Plotnikov, Alex Contact Type Call Call Type Triage / Clinical Relationship To Patient Self Appointment Disposition EMR Patient Reports Appointment Already Scheduled Info pasted into Epic Yes Return Phone Number [phone] (Primary) Chief Complaint BREATHING - shortness of breath or sounds breathless Initial Comment Caller states she has shortness of breath and has chest pain sometimes and beats really fast. [place] Not Listed Patient states she already made an appointment and does not want to go to ED PreDisposition Call Doctor Nurse Assessment Nurse: Verlin Fester, RN, Stanton Kidney Date/Time Eilene Ghazi Time): 06/15/2015 9:17:51 AM Confirm and document reason for call. If symptomatic, describe symptoms. ---Patient states she is having shortness of breath, chest pain and sometimes the heart beats really fast. no symptoms right now. Has the patient traveled out of the country within the last 30 days? ---No Does the patient require triage? ---Yes Related visit to physician within the last 2 weeks? ---No Does the PT have any chronic conditions? (i.e. diabetes, asthma, etc.) ---Yes List chronic conditions. ---"diabetes, Did the patient indicate they were pregnant? ---No Guidelines Guideline Title Affirmed Question Affirmed Notes Nurse Date/Time Eilene Ghazi Time) Chest Pain Taking a deep breath makes pain worse Verlin Fester, RN, Stanton Kidney 06/15/2015 9:20:08 AM Disp. Time Eilene Ghazi Time) Disposition Final User 06/15/2015 9:13:13 AM Send to Urgent Joelyn Oms 06/15/2015 9:30:33 AM Called On-Call Provider Verlin Fester, RN, Stanton Kidney Reason: Called back line and notified them  of ED outcome refusal spoke with [facility] 06/15/2015 9:31:16 AM Call Completed Verlin Fester, RN, Stanton Kidney PLEASE NOTE: All timestamps contained within this report are represented as [place] Standard Time. CONFIDENTIALTY NOTICE: This fax transmission is intended only for the addressee. It contains information that is legally privileged, confidential or otherwise protected from use or disclosure. If you are not the intended recipient, you are strictly prohibited from reviewing, disclosing, copying using or disseminating any of this information or taking any action in reliance on or regarding this information. If you have received this fax in error, please notify us immediately by telephone so that we can arrange for its return to [place]. Phone: [phone], Toll-Free: [phone], Fax: [phone] Page: 2 of 2 Call Id: [id number] 06/15/2015 9:25:19 AM Go to ED Now (or PCP triage) Yes Verlin Fester, RN, Nemiah Commander Understands: Yes Disagree/Comply: Disagree Disagree/Comply Reason: Disagree with instructions Care Advice Given Per Guideline GO TO ED NOW (OR PCP TRIAGE): BRING MEDICINES: * Please bring a list of your current medicines when you go to see the doctor. * It is also a good idea to bring the pill bottles too. This will help the doctor to make certain you are taking the right medicines and the right dose. CALL EMS IF: * Severe difficulty breathing occurs * Passes out or becomes too weak to stand * You become worse. CARE ADVICE given per Chest Pain (Adult) guideline. After Care Instructions Given Call Event Type User Date / Time Description

## 2015-06-15 NOTE — Telephone Encounter (Signed)
Patient Name: Katherine Herrera  DOB: Jan 13, 1966    Initial Comment Caller states she has shortness of breath and has chest pain sometimes and beats really fast.    Nurse Assessment  Nurse: Verlin Fester RN, Stanton Kidney Date/Time (Eastern Time): 06/15/2015 9:17:51 AM  Confirm and document reason for call. If symptomatic, describe symptoms. ---Patient states she is having shortness of breath, chest pain and sometimes the heart beats really fast. no symptoms right now.  Has the patient traveled out of the country within the last 30 days? ---No  Does the patient require triage? ---Yes  Related visit to physician within the last 2 weeks? ---No  Does the PT have any chronic conditions? (i.e. diabetes, asthma, etc.) ---Yes  List chronic conditions. ---"diabetes,  Did the patient indicate they were pregnant? ---No     Guidelines    Guideline Title Affirmed Question Affirmed Notes  Chest Pain Taking a deep breath makes pain worse    Final Disposition User   Go to ED Now (or PCP triage) Verlin Fester, RN, Stanton Kidney    Disagree/Comply: Disagree  Disagree/Comply Reason: Disagree with instructions   States she has made an appointment to be seen and she will keep this and doesn't want to go to ED

## 2015-06-15 NOTE — Telephone Encounter (Signed)
Pt states that we need to send her to [facility] for her genetic counseling please

## 2015-06-15 NOTE — Telephone Encounter (Signed)
done

## 2015-06-15 NOTE — Telephone Encounter (Signed)
See note below and please advise, Thanks! 

## 2015-06-15 NOTE — Addendum Note (Signed)
Addended by: Renato Shin on: 06/15/2015 12:01 PM   Modules accepted: Orders

## 2015-06-16 ENCOUNTER — Other Ambulatory Visit (INDEPENDENT_AMBULATORY_CARE_PROVIDER_SITE_OTHER): Payer: Medicare PPO

## 2015-06-16 ENCOUNTER — Ambulatory Visit (INDEPENDENT_AMBULATORY_CARE_PROVIDER_SITE_OTHER)
Admission: RE | Admit: 2015-06-16 | Discharge: 2015-06-16 | Disposition: A | Discharge status: Home / Self Care | Payer: Medicare PPO | Source: Ambulatory Visit | Attending: Internal Medicine | Admitting: Internal Medicine

## 2015-06-16 ENCOUNTER — Ambulatory Visit (INDEPENDENT_AMBULATORY_CARE_PROVIDER_SITE_OTHER): Payer: Medicare PPO | Admitting: Internal Medicine

## 2015-06-16 ENCOUNTER — Encounter: Payer: Self-pay | Admitting: Internal Medicine

## 2015-06-16 VITALS — BP 128/76 | HR 65 | Wt 243.0 lb

## 2015-06-16 DIAGNOSIS — R0789 Other chest pain: Secondary | ICD-10-CM | POA: Diagnosis not present

## 2015-06-16 DIAGNOSIS — R0609 Other forms of dyspnea: Secondary | ICD-10-CM

## 2015-06-16 DIAGNOSIS — R0600 Dyspnea, unspecified: Secondary | ICD-10-CM | POA: Insufficient documentation

## 2015-06-16 DIAGNOSIS — J441 Chronic obstructive pulmonary disease with (acute) exacerbation: Secondary | ICD-10-CM

## 2015-06-16 LAB — CBC WITH DIFFERENTIAL/PLATELET
Basophils Absolute: 0 10*3/uL (ref 0.0–0.1)
Basophils Relative: 0.4 % (ref 0.0–3.0)
Eosinophils Absolute: 0.2 10*3/uL (ref 0.0–0.7)
Eosinophils Relative: 1.4 % (ref 0.0–5.0)
HCT: 47.7 % — ABNORMAL HIGH (ref 36.0–46.0)
Hemoglobin: 16 g/dL — ABNORMAL HIGH (ref 12.0–15.0)
Lymphocytes Relative: 27.5 % (ref 12.0–46.0)
Lymphs Abs: 3.2 10*3/uL (ref 0.7–4.0)
MCHC: 33.6 g/dL (ref 30.0–36.0)
MCV: 84.7 fl (ref 78.0–100.0)
Monocytes Absolute: 0.6 10*3/uL (ref 0.1–1.0)
Monocytes Relative: 4.8 % (ref 3.0–12.0)
Neutro Abs: 7.7 10*3/uL (ref 1.4–7.7)
Neutrophils Relative %: 65.9 % (ref 43.0–77.0)
Platelets: 315 10*3/uL (ref 150.0–400.0)
RBC: 5.63 Mil/uL — ABNORMAL HIGH (ref 3.87–5.11)
RDW: 13.6 % (ref 11.5–15.5)
WBC: 11.7 10*3/uL — ABNORMAL HIGH (ref 4.0–10.5)

## 2015-06-16 LAB — BASIC METABOLIC PANEL
BUN: 12 mg/dL (ref 6–23)
CO2: 32 mEq/L (ref 19–32)
Calcium: 9.7 mg/dL (ref 8.4–10.5)
Chloride: 99 mEq/L (ref 96–112)
Creatinine, Ser: 0.77 mg/dL (ref 0.40–1.20)
GFR: 84.49 mL/min (ref >60.00)
Glucose, Bld: 104 mg/dL — ABNORMAL HIGH (ref 70–99)
Potassium: 4.4 mEq/L (ref 3.5–5.1)
Sodium: 138 mEq/L (ref 135–145)

## 2015-06-16 MED ORDER — ASPIRIN 325 MG PO TABS: 325.0000 mg | ORAL_TABLET | Freq: Every day | ORAL | Status: DC

## 2015-06-16 MED ORDER — ALBUTEROL SULFATE 108 (90 BASE) MCG/ACT IN AEPB: 1.0000 | INHALATION_SPRAY | Freq: Four times a day (QID) | RESPIRATORY_TRACT | Status: DC | PRN

## 2015-06-16 MED ORDER — ZOLPIDEM TARTRATE 10 MG PO TABS: 5.0000 mg | ORAL_TABLET | Freq: Every evening | ORAL | Status: DC | PRN

## 2015-06-16 MED ORDER — TRAMADOL HCL 50 MG PO TABS: 50.0000 mg | ORAL_TABLET | Freq: Two times a day (BID) | ORAL | Status: DC | PRN

## 2015-06-16 NOTE — Assessment & Plan Note (Addendum)
Proair prn CXR I personally provided Proair respiclick inhaler use teaching. After the teaching patient was able to demonstrate it's use effectively. All questions were answered

## 2015-06-16 NOTE — Assessment & Plan Note (Signed)
EKG ok Stress ECHO Proair CXR

## 2015-06-16 NOTE — Progress Notes (Signed)
Pre visit review using our clinic review tool, if applicable. No additional management support is needed unless otherwise documented below in the visit note. 

## 2015-06-16 NOTE — Assessment & Plan Note (Signed)
ASA Stress ECHO EKG ok Labs

## 2015-06-16 NOTE — Progress Notes (Signed)
Subjective:  Patient ID: Katherine Herrera, female    DOB: 02-Aug-1966  Age: 49 y.o. MRN: [id number]  CC: No chief complaint on file.   HPI Katherine Herrera presents for CP, SOB x 3-5 d off and on. Worse w/activity, better w/rest. Pt has had these sx's before. Taking Tramadol rare  Outpatient Prescriptions Prior to Visit  Medication Sig Dispense Refill  . ACCU-CHEK SOFTCLIX LANCETS lancets TEST TWO TIMES DAILY 200 each 3  . B-D INS SYR ULTRAFINE 1CC/31G 31G X 5/16" 1 ML MISC USE AS DIRECTED 100 each 2  . B-D INS SYRINGE 0.5CC/31GX5/16 31G X 5/16" 0.5 ML MISC USE AS DIRECTED WITH INSULIN 4 TIMES A DAY 100 each 2  . baclofen (LIORESAL) 10 MG tablet TAKE 2 TABLETS TWICE DAILY 360 tablet 1  . BD PEN NEEDLE NANO U/F 32G X 4 MM MISC     . Cholecalciferol (VITAMIN D3) 1000 UNIT tablet Take 2,000 Units by mouth daily.      . clonazePAM (KLONOPIN) 0.5 MG tablet TAKE ONE TABLET BY MOUTH 2 TIMES A DAY AS NEEDED FOR ANXIETY 60 tablet 2  . estradiol (ESTRACE) 1 MG tablet Take 1 mg by mouth daily.      Marland Kitchen ezetimibe (ZETIA) 10 MG tablet Take 1 tablet (10 mg total) by mouth daily. 90 tablet 1  . fluconazole (DIFLUCAN) 150 MG tablet Take 1 tablet (150 mg total) by mouth once. Use prn yeast infection 2 tablet 1  . gabapentin (NEURONTIN) 800 MG tablet Take 1 tablet (800 mg total) by mouth 4 (four) times daily. 360 tablet 11  . insulin NPH Human (HUMULIN N) 100 UNIT/ML injection Inject 0.5 mLs (50 Units total) into the skin at bedtime. 20 mL 11  . insulin regular (HUMULIN R) 100 units/mL injection INJECT 3 TIMES A DAY BEFORE EACH MEAL. (40-20-50 UNITS) (Patient taking differently: INJECT 3 TIMES A DAY BEFORE EACH MEAL. (50-30-60 UNITS)) 40 mL 11  . lamoTRIgine (LAMICTAL) 25 MG tablet TAKE 1 TABLET TWICE DAILY 180 tablet 1  . Lancet Devices (EASY TOUCH LANCING DEVICE) MISC     . levothyroxine (SYNTHROID, LEVOTHROID) 75 MCG tablet TAKE 1 TABLET EVERY DAY 90 tablet 3  . lidocaine (XYLOCAINE) 5 % ointment Apply 1  application topically as needed. 35.44 g 0  . oxyCODONE (ROXICODONE) 15 MG immediate release tablet Take 0.5-1 tablets (7.5-15 mg total) by mouth every 6 (six) hours as needed for pain. Please fill on or after 07/22/15 120 tablet 0  . PRESCRIPTION MEDICATION Apply 1-2 g topically See admin instructions. Apply 1-2 grams (1-2 ML) to affected area 2 to 3 times a day Diclofenac/Baclofen/Bupivacane    . traMADol (ULTRAM) 50 MG tablet Take 1-2 tablets (50-100 mg total) by mouth 2 (two) times daily as needed (for headache). 180 tablet 0  . vitamin B-12 (CYANOCOBALAMIN) 1000 MCG tablet Take 1,000 mcg by mouth daily.      Marland Kitchen zolpidem (AMBIEN) 10 MG tablet Take 0.5-1 tablets (5-10 mg total) by mouth at bedtime as needed for sleep. 90 tablet 1   No facility-administered medications prior to visit.    ROS Review of Systems  Constitutional: Negative for chills, activity change, appetite change, fatigue and unexpected weight change.  HENT: Negative for congestion, mouth sores and sinus pressure.   Eyes: Negative for visual disturbance.  Respiratory: Positive for chest tightness and shortness of breath. Negative for apnea, cough, choking and wheezing.   Cardiovascular: Positive for chest pain. Negative for palpitations and leg swelling.  Gastrointestinal: Negative for nausea, vomiting and abdominal pain.  Genitourinary: Negative for frequency, difficulty urinating and vaginal pain.  Musculoskeletal: Positive for back pain and arthralgias. Negative for myalgias and gait problem.  Skin: Negative for pallor, rash and wound.  Neurological: Negative for dizziness, tremors, weakness, numbness and headaches.  Psychiatric/Behavioral: Negative for suicidal ideas, confusion and sleep disturbance. The patient is not nervous/anxious.     Objective:  BP 128/76 mmHg  Pulse 65  Wt 243 lb (110.224 kg)  SpO2 99%  BP Readings from Last 3 Encounters:  06/16/15 128/76  05/12/15 120/64  05/12/15 120/80    Wt  Readings from Last 3 Encounters:  06/16/15 243 lb (110.224 kg)  05/12/15 247 lb (112.038 kg)  05/12/15 246 lb (111.585 kg)    Physical Exam  Constitutional: She appears well-developed. No distress.  HENT:  Head: Normocephalic.  Right Ear: External ear normal.  Left Ear: External ear normal.  Nose: Nose normal.  Mouth/Throat: Oropharynx is clear and moist.  Eyes: Conjunctivae are normal. Pupils are equal, round, and reactive to light. Right eye exhibits no discharge. Left eye exhibits no discharge.  Neck: Normal range of motion. Neck supple. No JVD present. No tracheal deviation present. No thyromegaly present.  Cardiovascular: Normal rate, regular rhythm and normal heart sounds.   Pulmonary/Chest: No stridor. No respiratory distress. She has no wheezes.  Abdominal: Soft. Bowel sounds are normal. She exhibits no distension and no mass. There is no tenderness. There is no rebound and no guarding.  Musculoskeletal: She exhibits edema and tenderness.  Lymphadenopathy:    She has no cervical adenopathy.  Neurological: She displays normal reflexes. No cranial nerve deficit. She exhibits normal muscle tone. Coordination normal.  Skin: No rash noted. No erythema.  Psychiatric: She has a normal mood and affect. Her behavior is normal. Judgment and thought content normal.  Obese LE B trace edema  Lab Results  Component Value Date   WBC 16.6* 01/29/2014   HGB 13.7 01/29/2014   HCT 40.5 01/29/2014   PLT 402* 01/29/2014   GLUCOSE 284* 03/12/2015   CHOL 218* 07/08/2014   TRIG * 07/08/2014    460.0 Triglyceride is over 400; calculations on Lipids are invalid.   HDL 30.90* 07/08/2014   LDLDIRECT 125.3 07/08/2014   LDLCALC 81 01/02/2013   ALT 11 01/28/2014   AST 17 01/28/2014   NA 133* 03/12/2015   K 4.0 03/12/2015   CL 97 03/12/2015   CREATININE 0.91 03/12/2015   BUN 15 03/12/2015   CO2 33* 03/12/2015   TSH 1.31 03/12/2015   INR 0.95 01/28/2014   HGBA1C 7.3 05/12/2015   MICROALBUR  <0.7 03/12/2015   EKG NSR   Assessment & Plan:   Diagnoses and all orders for this visit:  Other chest pain -     EKG 12-Lead   I am having Ms. Touch maintain her estradiol, vitamin B-12, cholecalciferol, BD PEN NEEDLE NANO U/F, EASY TOUCH LANCING DEVICE, PRESCRIPTION MEDICATION, baclofen, ACCU-CHEK SOFTCLIX LANCETS, B-D INS SYRINGE 0.5CC/31GX5/16, gabapentin, ezetimibe, lidocaine, B-D INS SYR ULTRAFINE 1CC/31G, fluconazole, clonazePAM, insulin NPH Human, insulin regular, lamoTRIgine, levothyroxine, oxyCODONE, traMADol, zolpidem, and ACCU-CHEK AVIVA PLUS.  Meds ordered this encounter  Medications  . ACCU-CHEK AVIVA PLUS test strip    Sig: 3 (three) times a week.     Follow-up: No Follow-up on file.  Walker Kehr, MD

## 2015-06-16 NOTE — Patient Instructions (Signed)
Go to ER or call 911 if worse

## 2015-06-16 NOTE — Telephone Encounter (Signed)
Rxs printed. Faxed to pharmacy.

## 2015-06-16 NOTE — Addendum Note (Signed)
Addended by: Cresenciano Lick on: 06/16/2015 02:18 PM   Modules accepted: Orders

## 2015-06-17 ENCOUNTER — Other Ambulatory Visit: Payer: Self-pay | Admitting: Internal Medicine

## 2015-06-17 ENCOUNTER — Telehealth: Payer: Self-pay

## 2015-06-17 DIAGNOSIS — R9389 Abnormal findings on diagnostic imaging of other specified body structures: Secondary | ICD-10-CM

## 2015-06-17 LAB — CREATININE KINASE MB: CK-MB: 2.3 ng/mL (ref 0.3–4.0)

## 2015-06-17 LAB — TSH: TSH: 1.94 u[IU]/mL (ref 0.35–4.50)

## 2015-06-17 LAB — D-DIMER, QUANTITATIVE: D-Dimer, Quant: 0.3 ug/mL-FEU (ref 0.00–0.48)

## 2015-06-17 LAB — TROPONIN I: TNIDX: 0 ug/l (ref 0.00–0.06)

## 2015-06-17 NOTE — Telephone Encounter (Signed)
Per dr plotnikov---he will be entering order for patient to have CT of chest without contrast---per tonya/[facility], chest xray was not clear with results----i called patient to advise that the appropriate dept will be calling her back

## 2015-06-18 ENCOUNTER — Telehealth: Payer: Self-pay | Admitting: Endocrinology

## 2015-06-18 NOTE — Telephone Encounter (Signed)
Pt returning call regarding genetic scheduling

## 2015-06-19 ENCOUNTER — Ambulatory Visit (INDEPENDENT_AMBULATORY_CARE_PROVIDER_SITE_OTHER): Payer: Medicare PPO | Admitting: Podiatry

## 2015-06-19 ENCOUNTER — Encounter: Payer: Self-pay | Admitting: Podiatry

## 2015-06-19 VITALS — BP 126/66 | HR 79

## 2015-06-19 DIAGNOSIS — E1140 Type 2 diabetes mellitus with diabetic neuropathy, unspecified: Secondary | ICD-10-CM

## 2015-06-19 DIAGNOSIS — M216X1 Other acquired deformities of right foot: Secondary | ICD-10-CM | POA: Diagnosis not present

## 2015-06-19 DIAGNOSIS — M79671 Pain in right foot: Secondary | ICD-10-CM | POA: Diagnosis not present

## 2015-06-19 DIAGNOSIS — R609 Edema, unspecified: Secondary | ICD-10-CM

## 2015-06-19 DIAGNOSIS — R600 Localized edema: Secondary | ICD-10-CM | POA: Insufficient documentation

## 2015-06-19 DIAGNOSIS — E0842 Diabetes mellitus due to underlying condition with diabetic polyneuropathy: Secondary | ICD-10-CM

## 2015-06-19 DIAGNOSIS — M21961 Unspecified acquired deformity of right lower leg: Secondary | ICD-10-CM | POA: Diagnosis not present

## 2015-06-19 DIAGNOSIS — L97511 Non-pressure chronic ulcer of other part of right foot limited to breakdown of skin: Secondary | ICD-10-CM | POA: Diagnosis not present

## 2015-06-19 DIAGNOSIS — L600 Ingrowing nail: Secondary | ICD-10-CM | POA: Diagnosis not present

## 2015-06-19 NOTE — Progress Notes (Signed)
Subjective:  49 year old female presents accompanied by her husband complaining of pain and swelling in right foot with a swollen callus under 5th MPJ.  Also request nails trimmed. Ingrown nails hurt on both feet.  Blood sugar is getting better. Last HgA1c reading was somewhere at 7. Patient is wearing bedroom slippers. Husband wants some type of procedure to relieve pain in right foot.   Objective:  Hypertrophic nails and ingrown nails both great toes. Positive of pain and swelling, (non pitting ) on right lateral 1/2 of foot with extreme tenderness upon light pressure. No cellulitis noted. No increase in temperature. Patient is able to take weight bearing steps. No open skin lesions noted.  Protruding 5th MPJ with pre ulcerative broad callus, 3cm in diameter under 5th MPJ with thick callus with dry blood. Pedal pulses are all palpable.  Enlarged 5th MPJ right with swollen capsul right. Decreased protective sensory perception bilateral.  Severe high arched foot.  Assessment: Pre ulcerative callus under 5th MPJ R.  Pes Cavus. Pain and swelling right lateral column.  IDDM with peripheral neuropathy with history of recurrent ulcer under 5th MPJ right foot.   Plan: Reviewed findings.  Pre ulcerative lesions debrided and padded under 5th MPJ right foot.  Compression bandage applied to right foot with instruction to stay off of feet. Ingrown nails and all other nails debrided. Informed that she need medical clearance for foot surgery.  Return in 4 weeks.

## 2015-06-19 NOTE — Patient Instructions (Signed)
Seen for pain and swelling in right foot. X-ray show no abnormal findings. Compression bandage applied. Stay off of feet. Need medication clearance for foot surgery (Elevated 5th metatarsal bone with screw fixation).  Return in 4 weeks.

## 2015-06-24 ENCOUNTER — Telehealth: Payer: Self-pay | Admitting: Internal Medicine

## 2015-06-24 NOTE — Telephone Encounter (Signed)
Can you please [facility] Her number is [phone] Her appt is tomorrow

## 2015-06-25 ENCOUNTER — Ambulatory Visit (INDEPENDENT_AMBULATORY_CARE_PROVIDER_SITE_OTHER)
Admission: RE | Admit: 2015-06-25 | Discharge: 2015-06-25 | Disposition: A | Discharge status: Home / Self Care | Payer: Medicare PPO | Source: Ambulatory Visit | Attending: Internal Medicine | Admitting: Internal Medicine

## 2015-06-25 ENCOUNTER — Ambulatory Visit (HOSPITAL_COMMUNITY): Payer: Medicare PPO

## 2015-06-25 DIAGNOSIS — R938 Abnormal findings on diagnostic imaging of other specified body structures: Secondary | ICD-10-CM

## 2015-06-25 DIAGNOSIS — R9389 Abnormal findings on diagnostic imaging of other specified body structures: Secondary | ICD-10-CM

## 2015-06-30 ENCOUNTER — Ambulatory Visit (INDEPENDENT_AMBULATORY_CARE_PROVIDER_SITE_OTHER): Payer: Medicare PPO | Admitting: Internal Medicine

## 2015-06-30 ENCOUNTER — Encounter: Payer: Self-pay | Admitting: Internal Medicine

## 2015-06-30 VITALS — BP 110/70 | HR 80 | Wt 250.0 lb

## 2015-06-30 DIAGNOSIS — R0600 Dyspnea, unspecified: Secondary | ICD-10-CM

## 2015-06-30 DIAGNOSIS — M79672 Pain in left foot: Secondary | ICD-10-CM

## 2015-06-30 DIAGNOSIS — J441 Chronic obstructive pulmonary disease with (acute) exacerbation: Secondary | ICD-10-CM | POA: Diagnosis not present

## 2015-06-30 DIAGNOSIS — E1140 Type 2 diabetes mellitus with diabetic neuropathy, unspecified: Secondary | ICD-10-CM

## 2015-06-30 DIAGNOSIS — E538 Deficiency of other specified B group vitamins: Secondary | ICD-10-CM

## 2015-06-30 DIAGNOSIS — R0609 Other forms of dyspnea: Secondary | ICD-10-CM

## 2015-06-30 DIAGNOSIS — R0789 Other chest pain: Secondary | ICD-10-CM

## 2015-06-30 NOTE — Assessment & Plan Note (Signed)
On B12 

## 2015-06-30 NOTE — Assessment & Plan Note (Signed)
8/16 Dr Caffie Pinto is planning surgery on R foot.Marland KitchenMarland Kitchen

## 2015-06-30 NOTE — Assessment & Plan Note (Signed)
Discussed goal Hgb A1c<7.0%

## 2015-06-30 NOTE — Progress Notes (Signed)
Subjective:  Patient ID: [name], female    DOB: [birth year]  Age: 49 y.o. MRN: [id number]  CC: No chief complaint on file.   HPI   MAVEN ROSANDER presents for SOB f/u - not better (heat related and aggravated by hot flashes). C/o R CP too. Chest CT was OK. C/o R lat foot pain - Dr Caffie Pinto is planning surgery.Marland KitchenMarland KitchenStress ECHO has not been done yet...  Outpatient Prescriptions Prior to Visit  Medication Sig Dispense Refill  . ACCU-CHEK AVIVA PLUS test strip 3 (three) times a week.    Marland Kitchen ACCU-CHEK SOFTCLIX LANCETS lancets TEST TWO TIMES DAILY 200 each 3  . Albuterol Sulfate (PROAIR RESPICLICK) 277 (90 BASE) MCG/ACT AEPB Inhale 1-2 puffs into the lungs 4 (four) times daily as needed. 1 each 5  . aspirin (BAYER ASPIRIN) 325 MG tablet Take 1 tablet (325 mg total) by mouth daily. 100 tablet 3  . B-D INS SYR ULTRAFINE 1CC/31G 31G X 5/16" 1 ML MISC USE AS DIRECTED 100 each 2  . B-D INS SYRINGE 0.5CC/31GX5/16 31G X 5/16" 0.5 ML MISC USE AS DIRECTED WITH INSULIN 4 TIMES A DAY 100 each 2  . baclofen (LIORESAL) 10 MG tablet TAKE 2 TABLETS TWICE DAILY 360 tablet 1  . BD PEN NEEDLE NANO U/F 32G X 4 MM MISC     . Cholecalciferol (VITAMIN D3) 1000 UNIT tablet Take 2,000 Units by mouth daily.      . clonazePAM (KLONOPIN) 0.5 MG tablet TAKE ONE TABLET BY MOUTH 2 TIMES A DAY AS NEEDED FOR ANXIETY 60 tablet 2  . estradiol (ESTRACE) 1 MG tablet Take 1 mg by mouth daily.      Marland Kitchen ezetimibe (ZETIA) 10 MG tablet Take 1 tablet (10 mg total) by mouth daily. 90 tablet 1  . fluconazole (DIFLUCAN) 150 MG tablet Take 1 tablet (150 mg total) by mouth once. Use prn yeast infection 2 tablet 1  . gabapentin (NEURONTIN) 800 MG tablet Take 1 tablet (800 mg total) by mouth 4 (four) times daily. 360 tablet 11  . insulin NPH Human (HUMULIN N) 100 UNIT/ML injection Inject 0.5 mLs (50 Units total) into the skin at bedtime. 20 mL 11  . insulin regular (HUMULIN R) 100 units/mL injection INJECT 3 TIMES A DAY BEFORE EACH MEAL.  (40-20-50 UNITS) (Patient taking differently: INJECT 3 TIMES A DAY BEFORE EACH MEAL. (50-30-60 UNITS)) 40 mL 11  . lamoTRIgine (LAMICTAL) 25 MG tablet TAKE 1 TABLET TWICE DAILY 180 tablet 1  . Lancet Devices (EASY TOUCH LANCING DEVICE) MISC     . levothyroxine (SYNTHROID, LEVOTHROID) 75 MCG tablet TAKE 1 TABLET EVERY DAY 90 tablet 3  . lidocaine (XYLOCAINE) 5 % ointment Apply 1 application topically as needed. 35.44 g 0  . oxyCODONE (ROXICODONE) 15 MG immediate release tablet Take 0.5-1 tablets (7.5-15 mg total) by mouth every 6 (six) hours as needed for pain. Please fill on or after 07/22/15 120 tablet 0  . PRESCRIPTION MEDICATION Apply 1-2 g topically See admin instructions. Apply 1-2 grams (1-2 ML) to affected area 2 to 3 times a day Diclofenac/Baclofen/Bupivacane    . traMADol (ULTRAM) 50 MG tablet Take 1-2 tablets (50-100 mg total) by mouth 2 (two) times daily as needed (for headache). 180 tablet 0  . vitamin B-12 (CYANOCOBALAMIN) 1000 MCG tablet Take 1,000 mcg by mouth daily.      Marland Kitchen zolpidem (AMBIEN) 10 MG tablet Take 0.5-1 tablets (5-10 mg total) by mouth at bedtime as needed for sleep. [place]  tablet 1   No facility-administered medications prior to visit.    ROS Review of Systems  Constitutional: Negative for chills, activity change, appetite change, fatigue and unexpected weight change.  HENT: Negative for congestion, mouth sores and sinus pressure.   Eyes: Negative for visual disturbance.  Respiratory: Positive for chest tightness and shortness of breath. Negative for cough.   Cardiovascular: Positive for chest pain.  Gastrointestinal: Negative for nausea and abdominal pain.  Genitourinary: Negative for frequency, difficulty urinating and vaginal pain.  Musculoskeletal: Negative for back pain and gait problem.  Skin: Negative for pallor and rash.  Neurological: Negative for dizziness, tremors, weakness, numbness and headaches.  Psychiatric/Behavioral: Negative for suicidal ideas,  confusion and sleep disturbance.    Objective:  BP 110/70 mmHg  Pulse 80  Wt 250 lb (113.399 kg)  SpO2 93%  BP Readings from Last 3 Encounters:  06/30/15 110/70  06/19/15 126/66  06/16/15 128/76    Wt Readings from Last 3 Encounters:  06/30/15 250 lb (113.399 kg)  06/16/15 243 lb (110.224 kg)  05/12/15 247 lb (112.038 kg)    Physical Exam  Constitutional: She appears well-developed. No distress.  HENT:  Head: Normocephalic.  Right Ear: External ear normal.  Left Ear: External ear normal.  Nose: Nose normal.  Mouth/Throat: Oropharynx is clear and moist.  Eyes: Conjunctivae are normal. Pupils are equal, round, and reactive to light. Right eye exhibits no discharge. Left eye exhibits no discharge.  Neck: Normal range of motion. Neck supple. No JVD present. No tracheal deviation present. No thyromegaly present.  Cardiovascular: Normal rate, regular rhythm and normal heart sounds.   Pulmonary/Chest: No stridor. No respiratory distress. She has no wheezes. She has no rales. She exhibits tenderness (R side).  Abdominal: Soft. Bowel sounds are normal. She exhibits no distension and no mass. There is no tenderness. There is no rebound and no guarding.  Musculoskeletal: She exhibits tenderness. She exhibits no edema.  Lymphadenopathy:    She has no cervical adenopathy.  Neurological: She displays normal reflexes. No cranial nerve deficit. She exhibits normal muscle tone. Coordination normal.  Skin: No rash noted. No erythema.  Psychiatric: She has a normal mood and affect. Her behavior is normal. Judgment and thought content normal.  Obese  R lat foot is tender Lab Results  Component Value Date   WBC 11.7* 06/16/2015   HGB 16.0* 06/16/2015   HCT 47.7* 06/16/2015   PLT 315.0 06/16/2015   GLUCOSE 104* 06/16/2015   CHOL 218* 07/08/2014   TRIG * 07/08/2014    460.0 Triglyceride is over 400; calculations on Lipids are invalid.   HDL 30.90* 07/08/2014   LDLDIRECT 125.3 07/08/2014    LDLCALC 81 01/02/2013   ALT 11 01/28/2014   AST 17 01/28/2014   NA 138 06/16/2015   K 4.4 06/16/2015   CL 99 06/16/2015   CREATININE 0.77 06/16/2015   BUN 12 06/16/2015   CO2 32 06/16/2015   TSH 1.94 06/16/2015   INR 0.95 01/28/2014   HGBA1C 7.3 05/12/2015   MICROALBUR <0.7 03/12/2015    Ct Chest Wo Contrast  06/25/2015   CLINICAL DATA:  Abnormal chest x-ray with progressive shortness of breath.  EXAM: CT CHEST WITHOUT CONTRAST  TECHNIQUE: Multidetector CT imaging of the chest was performed following the standard protocol without IV contrast.  COMPARISON:  Chest x-ray dated 06/16/2015.  FINDINGS: Heart size is normal. No pericardial effusion. Thoracic aorta is normal in caliber and configuration. Scattered small lymph nodes noted within the mediastinum. No  mass or enlarged lymph nodes within the mediastinum or perihilar regions.  Lungs are clear. No evidence of pneumonia. No pleural effusion. No pulmonary nodule or mass. No interstitial thickening. Trachea and central bronchi appear normal. Limited images of the upper abdomen are unremarkable.  Mild degenerative change noted within the thoracic spine but no acute osseous abnormality.  IMPRESSION: Essentially normal chest CT. No acute findings. Lungs are clear and mediastinum appears normal. The nodular opacity identified in the right suprahilar region on recent chest x-ray was likely due to superimposition of normal suprahilar vessels.   Electronically Signed   By: Franki Cabot M.D.   On: 06/25/2015 14:39    Assessment & Plan:   There are no diagnoses linked to this encounter. I am having Ms. Henckel maintain her estradiol, vitamin B-12, cholecalciferol, BD PEN NEEDLE NANO U/F, EASY TOUCH LANCING DEVICE, PRESCRIPTION MEDICATION, baclofen, ACCU-CHEK SOFTCLIX LANCETS, B-D INS SYRINGE 0.5CC/31GX5/16, gabapentin, ezetimibe, lidocaine, B-D INS SYR ULTRAFINE 1CC/31G, fluconazole, clonazePAM, insulin NPH Human, insulin regular, lamoTRIgine,  levothyroxine, oxyCODONE, traMADol, zolpidem, ACCU-CHEK AVIVA PLUS, Albuterol Sulfate, and aspirin.  No orders of the defined types were placed in this encounter.     Follow-up: No Follow-up on file.  Walker Kehr, MD

## 2015-06-30 NOTE — Assessment & Plan Note (Addendum)
CT CHEST ok Doing OK. Will re-check in 2 weeks

## 2015-06-30 NOTE — Progress Notes (Signed)
Pre visit review using our clinic review tool, if applicable. No additional management support is needed unless otherwise documented below in the visit note. 

## 2015-07-01 NOTE — Assessment & Plan Note (Signed)
Stress ECHO re-ordered

## 2015-07-03 ENCOUNTER — Telehealth: Payer: Self-pay | Admitting: Internal Medicine

## 2015-07-03 ENCOUNTER — Telehealth: Payer: Self-pay | Admitting: *Deleted

## 2015-07-03 MED ORDER — OXYCODONE HCL 15 MG PO TABS: 15.0000 mg | ORAL_TABLET | Freq: Four times a day (QID) | ORAL | Status: DC | PRN

## 2015-07-03 NOTE — Telephone Encounter (Signed)
Pt has [employer] PPO and does not require an insurance referral.

## 2015-07-03 NOTE — Telephone Encounter (Signed)
Sorry! OK RX Thx

## 2015-07-03 NOTE — Telephone Encounter (Signed)
Left msg on triage stating wife broke her foot on yesterday, and she is taking more of the pain med she uses for her back. Wanting md to rx additional pain med to help for her foot...Johny Chess

## 2015-07-03 NOTE — Telephone Encounter (Signed)
Is requesting a [employer] referral for a broken right foot.  Patient went to [facility] and they referred her to [facility] orthopedic surgeon in [place]. Fax number [phone]. Patient has appointment scheduled for 9/6 at 8:20am.

## 2015-07-07 ENCOUNTER — Other Ambulatory Visit: Payer: Self-pay | Admitting: Endocrinology

## 2015-07-07 NOTE — Telephone Encounter (Signed)
Call pt no answer [facility] rx ready for pick-up. Has been place in cabinet...Katherine Herrera

## 2015-07-09 ENCOUNTER — Ambulatory Visit: Payer: Medicare PPO | Admitting: Dietician

## 2015-07-20 ENCOUNTER — Ambulatory Visit: Payer: Medicare PPO | Admitting: Podiatry

## 2015-07-24 ENCOUNTER — Encounter: Payer: Self-pay | Admitting: Internal Medicine

## 2015-07-24 ENCOUNTER — Ambulatory Visit (INDEPENDENT_AMBULATORY_CARE_PROVIDER_SITE_OTHER): Payer: Medicare PPO | Admitting: Internal Medicine

## 2015-07-24 VITALS — BP 110/70 | HR 85 | Temp 98.0°F | Wt 246.0 lb

## 2015-07-24 DIAGNOSIS — L02419 Cutaneous abscess of limb, unspecified: Secondary | ICD-10-CM | POA: Diagnosis not present

## 2015-07-24 DIAGNOSIS — L03031 Cellulitis of right toe: Secondary | ICD-10-CM | POA: Insufficient documentation

## 2015-07-24 DIAGNOSIS — L03119 Cellulitis of unspecified part of limb: Secondary | ICD-10-CM | POA: Diagnosis not present

## 2015-07-24 DIAGNOSIS — R110 Nausea: Secondary | ICD-10-CM | POA: Diagnosis not present

## 2015-07-24 DIAGNOSIS — L02611 Cutaneous abscess of right foot: Secondary | ICD-10-CM | POA: Insufficient documentation

## 2015-07-24 MED ORDER — ONDANSETRON HCL 4 MG/2ML IJ SOLN
4.0000 mg | Freq: Once | INTRAMUSCULAR | Status: AC
  Administered 2015-07-24: 4 mg via INTRAMUSCULAR

## 2015-07-24 MED ORDER — SILVER SULFADIAZINE 1 % EX CREA: 1.0000 "application " | TOPICAL_CREAM | Freq: Two times a day (BID) | CUTANEOUS | Status: DC

## 2015-07-24 MED ORDER — DOXYCYCLINE HYCLATE 100 MG PO TABS: 100.0000 mg | ORAL_TABLET | Freq: Two times a day (BID) | ORAL | Status: DC

## 2015-07-24 MED ORDER — ONDANSETRON HCL 4 MG PO TABS: 4.0000 mg | ORAL_TABLET | Freq: Three times a day (TID) | ORAL | Status: DC | PRN

## 2015-07-24 NOTE — Progress Notes (Signed)
Subjective:  Patient ID: Katherine Herrera, female    DOB: [birth year]  Age: 49 y.o. MRN: [id number]  CC: No chief complaint on file.   HPI Katherine Herrera presents for RLE sore and red area from the cast. Dr Koleen Nimrod gave pt Septra - pt took it x 2 d and stopped due to nausea. S/p R foot fx 3 wks ago  Outpatient Prescriptions Prior to Visit  Medication Sig Dispense Refill  . ACCU-CHEK AVIVA PLUS test strip 3 (three) times a week.    Marland Kitchen ACCU-CHEK SOFTCLIX LANCETS lancets TEST TWO TIMES DAILY 200 each 3  . Albuterol Sulfate (PROAIR RESPICLICK) 329 (90 BASE) MCG/ACT AEPB Inhale 1-2 puffs into the lungs 4 (four) times daily as needed. 1 each 5  . aspirin (BAYER ASPIRIN) 325 MG tablet Take 1 tablet (325 mg total) by mouth daily. 100 tablet 3  . B-D INS SYR ULTRAFINE 1CC/31G 31G X 5/16" 1 ML MISC USE AS DIRECTED 100 each 2  . B-D INS SYRINGE 0.5CC/31GX5/16 31G X 5/16" 0.5 ML MISC USE AS DIRECTED WITH INSULIN 4 TIMES A DAY 100 each 0  . baclofen (LIORESAL) 10 MG tablet TAKE 2 TABLETS TWICE DAILY 360 tablet 1  . BD PEN NEEDLE NANO U/F 32G X 4 MM MISC     . Cholecalciferol (VITAMIN D3) 1000 UNIT tablet Take 2,000 Units by mouth daily.      . clonazePAM (KLONOPIN) 0.5 MG tablet TAKE ONE TABLET BY MOUTH 2 TIMES A DAY AS NEEDED FOR ANXIETY 60 tablet 2  . estradiol (ESTRACE) 1 MG tablet Take 1 mg by mouth daily.      Marland Kitchen ezetimibe (ZETIA) 10 MG tablet Take 1 tablet (10 mg total) by mouth daily. 90 tablet 1  . gabapentin (NEURONTIN) 800 MG tablet Take 1 tablet (800 mg total) by mouth 4 (four) times daily. 360 tablet 11  . insulin NPH Human (HUMULIN N) 100 UNIT/ML injection Inject 0.5 mLs (50 Units total) into the skin at bedtime. 20 mL 11  . insulin regular (HUMULIN R) 100 units/mL injection INJECT 3 TIMES A DAY BEFORE EACH MEAL. (40-20-50 UNITS) (Patient taking differently: INJECT 3 TIMES A DAY BEFORE EACH MEAL. (50-30-60 UNITS)) 40 mL 11  . lamoTRIgine (LAMICTAL) 25 MG tablet TAKE 1 TABLET TWICE DAILY  180 tablet 1  . Lancet Devices (EASY TOUCH LANCING DEVICE) MISC     . levothyroxine (SYNTHROID, LEVOTHROID) 75 MCG tablet TAKE 1 TABLET EVERY DAY 90 tablet 3  . lidocaine (XYLOCAINE) 5 % ointment Apply 1 application topically as needed. 35.44 g 0  . oxyCODONE (ROXICODONE) 15 MG immediate release tablet Take 1-1.5 tablets (15-22.5 mg total) by mouth every 6 (six) hours as needed for pain. OK to fill early 120 tablet 0  . PRESCRIPTION MEDICATION Apply 1-2 g topically See admin instructions. Apply 1-2 grams (1-2 ML) to affected area 2 to 3 times a day Diclofenac/Baclofen/Bupivacane    . vitamin B-12 (CYANOCOBALAMIN) 1000 MCG tablet Take 1,000 mcg by mouth daily.      Marland Kitchen zolpidem (AMBIEN) 10 MG tablet Take 0.5-1 tablets (5-10 mg total) by mouth at bedtime as needed for sleep. 90 tablet 1   No facility-administered medications prior to visit.    ROS Review of Systems  Constitutional: Positive for fever, chills and fatigue. Negative for activity change, appetite change and unexpected weight change.  HENT: Negative for congestion, mouth sores and sinus pressure.   Eyes: Negative for visual disturbance.  Respiratory: Negative for cough and  chest tightness.   Gastrointestinal: Positive for nausea. Negative for abdominal pain.  Genitourinary: Negative for frequency, difficulty urinating and vaginal pain.  Musculoskeletal: Positive for back pain and arthralgias. Negative for gait problem.  Skin: Positive for rash and wound. Negative for pallor.  Neurological: Negative for dizziness, tremors, weakness, numbness and headaches.  Psychiatric/Behavioral: Negative for confusion and sleep disturbance.    Objective:  BP 110/70 mmHg  Pulse 85  Temp(Src) 98 F (36.7 C) (Oral)  Wt 246 lb (111.585 kg)  SpO2 96%  BP Readings from Last 3 Encounters:  07/24/15 110/70  06/30/15 110/70  06/19/15 126/66    Wt Readings from Last 3 Encounters:  07/24/15 246 lb (111.585 kg)  06/30/15 250 lb (113.399 kg)    06/16/15 243 lb (110.224 kg)    Physical Exam  Constitutional: She appears well-developed. No distress.  HENT:  Head: Normocephalic.  Right Ear: External ear normal.  Left Ear: External ear normal.  Nose: Nose normal.  Mouth/Throat: Oropharynx is clear and moist.  Eyes: Conjunctivae are normal. Pupils are equal, round, and reactive to light. Right eye exhibits no discharge. Left eye exhibits no discharge.  Neck: Normal range of motion. Neck supple. No JVD present. No tracheal deviation present. No thyromegaly present.  Cardiovascular: Normal rate, regular rhythm and normal heart sounds.   Pulmonary/Chest: No stridor. No respiratory distress. She has no wheezes.  Abdominal: Soft. Bowel sounds are normal. She exhibits no distension and no mass. There is no tenderness. There is no rebound and no guarding.  Musculoskeletal: She exhibits edema and tenderness.  Lymphadenopathy:    She has no cervical adenopathy.  Neurological: She displays normal reflexes. No cranial nerve deficit. She exhibits normal muscle tone. Coordination normal.  Skin: No rash noted. There is erythema.  Psychiatric: She has a normal mood and affect. Her behavior is normal. Judgment and thought content normal.  R prox shin 1.4x0.6 cm abrasion and 6x8 erythema - wound dressed  Lab Results  Component Value Date   WBC 11.7* 06/16/2015   HGB 16.0* 06/16/2015   HCT 47.7* 06/16/2015   PLT 315.0 06/16/2015   GLUCOSE 104* 06/16/2015   CHOL 218* 07/08/2014   TRIG * 07/08/2014    460.0 Triglyceride is over 400; calculations on Lipids are invalid.   HDL 30.90* 07/08/2014   LDLDIRECT 125.3 07/08/2014   LDLCALC 81 01/02/2013   ALT 11 01/28/2014   AST 17 01/28/2014   NA 138 06/16/2015   K 4.4 06/16/2015   CL 99 06/16/2015   CREATININE 0.77 06/16/2015   BUN 12 06/16/2015   CO2 32 06/16/2015   TSH 1.94 06/16/2015   INR 0.95 01/28/2014   HGBA1C 7.3 05/12/2015   MICROALBUR <0.7 03/12/2015    Ct Chest Wo  Contrast  06/25/2015   CLINICAL DATA:  Abnormal chest x-ray with progressive shortness of breath.  EXAM: CT CHEST WITHOUT CONTRAST  TECHNIQUE: Multidetector CT imaging of the chest was performed following the standard protocol without IV contrast.  COMPARISON:  Chest x-ray dated 06/16/2015.  FINDINGS: Heart size is normal. No pericardial effusion. Thoracic aorta is normal in caliber and configuration. Scattered small lymph nodes noted within the mediastinum. No mass or enlarged lymph nodes within the mediastinum or perihilar regions.  Lungs are clear. No evidence of pneumonia. No pleural effusion. No pulmonary nodule or mass. No interstitial thickening. Trachea and central bronchi appear normal. Limited images of the upper abdomen are unremarkable.  Mild degenerative change noted within the thoracic spine but no acute  osseous abnormality.  IMPRESSION: Essentially normal chest CT. No acute findings. Lungs are clear and mediastinum appears normal. The nodular opacity identified in the right suprahilar region on recent chest x-ray was likely due to superimposition of normal suprahilar vessels.   Electronically Signed   By: Franki Cabot M.D.   On: 06/25/2015 14:39    Assessment & Plan:   Diagnoses and all orders for this visit:  Cellulitis and abscess of leg  Nausea without vomiting -     ondansetron (ZOFRAN) injection 4 mg; Inject 2 mLs (4 mg total) into the muscle once.  Other orders -     ondansetron (ZOFRAN) 4 MG tablet; Take 1 tablet (4 mg total) by mouth every 8 (eight) hours as needed for nausea or vomiting. -     doxycycline (VIBRA-TABS) 100 MG tablet; Take 1 tablet (100 mg total) by mouth 2 (two) times daily. -     silver sulfADIAZINE (SILVADENE) 1 % cream; Apply 1 application topically 2 (two) times daily. On the leg wound  I am having Ms. Hallahan start on ondansetron, doxycycline, and silver sulfADIAZINE. I am also having her maintain her estradiol, vitamin B-12, cholecalciferol, BD PEN  NEEDLE NANO U/F, EASY TOUCH LANCING DEVICE, PRESCRIPTION MEDICATION, baclofen, ACCU-CHEK SOFTCLIX LANCETS, gabapentin, ezetimibe, lidocaine, B-D INS SYR ULTRAFINE 1CC/31G, clonazePAM, insulin NPH Human, insulin regular, lamoTRIgine, levothyroxine, zolpidem, ACCU-CHEK AVIVA PLUS, Albuterol Sulfate, aspirin, oxyCODONE, and B-D INS SYRINGE 0.5CC/31GX5/16. We administered ondansetron.  Meds ordered this encounter  Medications  . ondansetron (ZOFRAN) 4 MG tablet    Sig: Take 1 tablet (4 mg total) by mouth every 8 (eight) hours as needed for nausea or vomiting.    Dispense:  20 tablet    Refill:  0  . doxycycline (VIBRA-TABS) 100 MG tablet    Sig: Take 1 tablet (100 mg total) by mouth 2 (two) times daily.    Dispense:  20 tablet    Refill:  0  . silver sulfADIAZINE (SILVADENE) 1 % cream    Sig: Apply 1 application topically 2 (two) times daily. On the leg wound    Dispense:  25 g    Refill:  0  . ondansetron (ZOFRAN) injection 4 mg    Sig:      Follow-up: Return in about 1 week (around 07/31/2015) for a follow-up visit.  Walker Kehr, MD

## 2015-07-24 NOTE — Assessment & Plan Note (Signed)
D/c Septra Zofran

## 2015-07-24 NOTE — Assessment & Plan Note (Signed)
RLE sore and red area from the cast. Dr Koleen Nimrod gave pt Septra - pt took it x 2 d and stopped due to nausea. S/p R foot fx 3 wks ago D/c Septra. Start Doxy tomorrow. Silvadene cream qd

## 2015-07-24 NOTE — Progress Notes (Signed)
Pre visit review using our clinic review tool, if applicable. No additional management support is needed unless otherwise documented below in the visit note. 

## 2015-08-12 ENCOUNTER — Encounter: Payer: Self-pay | Admitting: Internal Medicine

## 2015-08-12 ENCOUNTER — Ambulatory Visit (INDEPENDENT_AMBULATORY_CARE_PROVIDER_SITE_OTHER): Payer: Medicare PPO | Admitting: Internal Medicine

## 2015-08-12 ENCOUNTER — Encounter: Payer: Self-pay | Admitting: Endocrinology

## 2015-08-12 ENCOUNTER — Ambulatory Visit (INDEPENDENT_AMBULATORY_CARE_PROVIDER_SITE_OTHER): Payer: Medicare PPO | Admitting: Endocrinology

## 2015-08-12 VITALS — BP 118/74 | HR 98 | Wt 255.0 lb

## 2015-08-12 VITALS — BP 144/84 | HR 96 | Temp 98.0°F | Ht 69.5 in | Wt 247.0 lb

## 2015-08-12 DIAGNOSIS — M25571 Pain in right ankle and joints of right foot: Secondary | ICD-10-CM | POA: Diagnosis not present

## 2015-08-12 DIAGNOSIS — R0609 Other forms of dyspnea: Secondary | ICD-10-CM

## 2015-08-12 DIAGNOSIS — E1140 Type 2 diabetes mellitus with diabetic neuropathy, unspecified: Secondary | ICD-10-CM

## 2015-08-12 DIAGNOSIS — R0600 Dyspnea, unspecified: Secondary | ICD-10-CM

## 2015-08-12 DIAGNOSIS — E538 Deficiency of other specified B group vitamins: Secondary | ICD-10-CM | POA: Diagnosis not present

## 2015-08-12 DIAGNOSIS — Z23 Encounter for immunization: Secondary | ICD-10-CM | POA: Diagnosis not present

## 2015-08-12 LAB — POCT GLYCOSYLATED HEMOGLOBIN (HGB A1C): Hemoglobin A1C: 8.3

## 2015-08-12 MED ORDER — OXYCODONE HCL 15 MG PO TABS: 15.0000 mg | ORAL_TABLET | Freq: Four times a day (QID) | ORAL | Status: DC | PRN

## 2015-08-12 MED ORDER — INSULIN REGULAR HUMAN 100 UNIT/ML IJ SOLN: INTRAMUSCULAR | Status: DC

## 2015-08-12 MED ORDER — INSULIN NPH (HUMAN) (ISOPHANE) 100 UNIT/ML ~~LOC~~ SUSP: 35.0000 [IU] | Freq: Every day | SUBCUTANEOUS | Status: DC

## 2015-08-12 MED ORDER — CLONAZEPAM 0.5 MG PO TABS: ORAL_TABLET | ORAL | Status: DC

## 2015-08-12 NOTE — Assessment & Plan Note (Signed)
No relapse 

## 2015-08-12 NOTE — Progress Notes (Signed)
Subjective:    Patient ID: Katherine Herrera, female    DOB: [birth year], 49 y.o.   MRN: [id number]  HPI Pt returns for f/u of diabetes mellitus: DM type: Insulin-requiring type 2 Dx'ed: 5400 Complications: polyneuropathy and foot ulcers.  Therapy: insulin since 2012.   GDM: never. DKA: never Severe hypoglycemia: last episode was in late 2013.   Pancreatitis: never. Other: she takes multiple daily injections; she takes human insulin, for cost reasons.   Interval history: She seldom checks cbg's.  She denies sxs of hypoglycemia.  She says she seldom  misses the insulin injections, except the HS, which she frequently misses.  Past Medical History  Diagnosis Date  . Syncope     poss conversion disorder vs psychogenic seizures  . DM (diabetes mellitus) ([place])   . Hypothyroidism   . LBP (low back pain)     DR Patrice Paradise  . Peripheral neuropathy ([place])   . COPD (chronic obstructive pulmonary disease) ([place])   . Vitamin D deficiency   . Migraine   . Hyperlipidemia   . Anxiety   . Gout     possible  . Tachycardia     diet coke overuse?  . TTS (tarsal tunnel syndrome) 2010    Left Foot [facility] in [place]  . Tenosynovitis of foot and ankle 09/03/2013    Past Surgical History  Procedure Laterality Date  . Cervical fusion      C4-5  . Abdominal hysterectomy    . Carpal tunnel release  2010    LEFT    Social History   Social History  . Marital Status: Married    Spouse Name: N/A  . Number of Children: N/A  . Years of Education: N/A   Occupational History  . Not on file.   Social History Main Topics  . Smoking status: Former Smoker    Quit date: 12/01/2009  . Smokeless tobacco: Never Used  . Alcohol Use: No  . Drug Use: No  . Sexual Activity: Yes   Other Topics Concern  . Not on file   Social History Narrative   GYN - Dr Radene Knee      FAMILY HISTORY   Lung cancer   Mother w/alcoholism      Married 3d GS due Nov   Diet Coke: 10 cans a day      Regular exercise - NO     Former smoker 2009 restared 2010 stopped 12/2009    Current Outpatient Prescriptions on File Prior to Visit  Medication Sig Dispense Refill  . ACCU-CHEK AVIVA PLUS test strip 3 (three) times a week.    Marland Kitchen ACCU-CHEK SOFTCLIX LANCETS lancets TEST TWO TIMES DAILY 200 each 3  . Albuterol Sulfate (PROAIR RESPICLICK) 867 (90 BASE) MCG/ACT AEPB Inhale 1-2 puffs into the lungs 4 (four) times daily as needed. 1 each 5  . aspirin (BAYER ASPIRIN) 325 MG tablet Take 1 tablet (325 mg total) by mouth daily. 100 tablet 3  . B-D INS SYR ULTRAFINE 1CC/31G 31G X 5/16" 1 ML MISC USE AS DIRECTED 100 each 2  . B-D INS SYRINGE 0.5CC/31GX5/16 31G X 5/16" 0.5 ML MISC USE AS DIRECTED WITH INSULIN 4 TIMES A DAY 100 each 0  . baclofen (LIORESAL) 10 MG tablet TAKE 2 TABLETS TWICE DAILY 360 tablet 1  . BD PEN NEEDLE NANO U/F 32G X 4 MM MISC     . Cholecalciferol (VITAMIN D3) 1000 UNIT tablet Take 2,000 Units by mouth daily.      Marland Kitchen  doxycycline (VIBRA-TABS) 100 MG tablet Take 1 tablet (100 mg total) by mouth 2 (two) times daily. 20 tablet 0  . estradiol (ESTRACE) 1 MG tablet Take 1 mg by mouth daily.      Marland Kitchen ezetimibe (ZETIA) 10 MG tablet Take 1 tablet (10 mg total) by mouth daily. 90 tablet 1  . gabapentin (NEURONTIN) 800 MG tablet Take 1 tablet (800 mg total) by mouth 4 (four) times daily. 360 tablet 11  . lamoTRIgine (LAMICTAL) 25 MG tablet TAKE 1 TABLET TWICE DAILY 180 tablet 1  . Lancet Devices (EASY TOUCH LANCING DEVICE) MISC     . levothyroxine (SYNTHROID, LEVOTHROID) 75 MCG tablet TAKE 1 TABLET EVERY DAY 90 tablet 3  . lidocaine (XYLOCAINE) 5 % ointment Apply 1 application topically as needed. 35.44 g 0  . ondansetron (ZOFRAN) 4 MG tablet Take 1 tablet (4 mg total) by mouth every 8 (eight) hours as needed for nausea or vomiting. 20 tablet 0  . PRESCRIPTION MEDICATION Apply 1-2 g topically See admin instructions. Apply 1-2 grams (1-2 ML) to affected area 2 to 3 times a day Diclofenac/Baclofen/Bupivacane    . silver  sulfADIAZINE (SILVADENE) 1 % cream Apply 1 application topically 2 (two) times daily. On the leg wound 25 g 0  . vitamin B-12 (CYANOCOBALAMIN) 1000 MCG tablet Take 1,000 mcg by mouth daily.      Marland Kitchen zolpidem (AMBIEN) 10 MG tablet Take 0.5-1 tablets (5-10 mg total) by mouth at bedtime as needed for sleep. 90 tablet 1   No current facility-administered medications on file prior to visit.    Allergies  Allergen Reactions  . Crestor [Rosuvastatin Calcium]     myalgia  . Lovastatin Other (See Comments)    Unknown   . Metformin Nausea And Vomiting  . Niacin Other (See Comments)    Unknown   . Paroxetine Other (See Comments)    Unknown   . Meperidine Hcl Rash    Family History  Problem Relation Age of Onset  . Diabetes Mother   . Hypertension Mother   . Cancer Father 43    ? bone cancer  . [name] Brother     BP 144/84 mmHg  Pulse 96  Temp(Src) 98 F (36.7 C) (Oral)  Ht 5' 9.5" (1.765 m)  Wt 247 lb (112.038 kg)  BMI 35.96 kg/m2  SpO2 92%  Review of Systems She denies hypoglycemia    Objective:   Physical Exam VITAL SIGNS:  See vs page GENERAL: no distress Right leg and foot are in a boot.  Pulses: left dorsalis pedis intact.  Left foot: no deformity. normal color and temp. trace leg edema.  Skin: Multiple healed ulcers at the left leg and foot (sees derm).   Neuro: sensation is intact to touch on the left foot, but decreased from normal.   A1c=8.3%    Assessment & Plan:  DM: she needs increased rx.   Noncompliance with cbg monitoring and insulin, worse: we could change to qd insulin, but I hesitate to, as a1c is not too bad.     Patient is advised the following: Patient Instructions  check your blood sugar twice a day.  vary the time of day when you check, between before the 3 meals, and at bedtime.  also check if you have symptoms of your blood sugar being too high or too low.  please keep a record of the readings and bring it to your next  appointment here.  please call us sooner if your  blood sugar goes below 70, or if it stays over 200.   Please make every effort to remember the bedtime insulin.  please come back for a follow-up appointment in 2 months.

## 2015-08-12 NOTE — Progress Notes (Signed)
Subjective:  Patient ID: Katherine Herrera, female    DOB: 1966-09-01  Age: 49 y.o. MRN: [id number]  CC: No chief complaint on file.   HPI TANEIL LAZARUS presents for chronic pain, LBP, anxiety, DM f/up  Outpatient Prescriptions Prior to Visit  Medication Sig Dispense Refill  . ACCU-CHEK AVIVA PLUS test strip 3 (three) times a week.    Marland Kitchen ACCU-CHEK SOFTCLIX LANCETS lancets TEST TWO TIMES DAILY 200 each 3  . Albuterol Sulfate (PROAIR RESPICLICK) 536 (90 BASE) MCG/ACT AEPB Inhale 1-2 puffs into the lungs 4 (four) times daily as needed. 1 each 5  . aspirin (BAYER ASPIRIN) 325 MG tablet Take 1 tablet (325 mg total) by mouth daily. 100 tablet 3  . B-D INS SYR ULTRAFINE 1CC/31G 31G X 5/16" 1 ML MISC USE AS DIRECTED 100 each 2  . B-D INS SYRINGE 0.5CC/31GX5/16 31G X 5/16" 0.5 ML MISC USE AS DIRECTED WITH INSULIN 4 TIMES A DAY 100 each 0  . baclofen (LIORESAL) 10 MG tablet TAKE 2 TABLETS TWICE DAILY 360 tablet 1  . BD PEN NEEDLE NANO U/F 32G X 4 MM MISC     . Cholecalciferol (VITAMIN D3) 1000 UNIT tablet Take 2,000 Units by mouth daily.      Marland Kitchen doxycycline (VIBRA-TABS) 100 MG tablet Take 1 tablet (100 mg total) by mouth 2 (two) times daily. 20 tablet 0  . estradiol (ESTRACE) 1 MG tablet Take 1 mg by mouth daily.      Marland Kitchen ezetimibe (ZETIA) 10 MG tablet Take 1 tablet (10 mg total) by mouth daily. 90 tablet 1  . gabapentin (NEURONTIN) 800 MG tablet Take 1 tablet (800 mg total) by mouth 4 (four) times daily. 360 tablet 11  . insulin NPH Human (HUMULIN N) 100 UNIT/ML injection Inject 0.35 mLs (35 Units total) into the skin at bedtime. 20 mL 11  . insulin regular (HUMULIN R) 100 units/mL injection INJECT 3 TIMES A DAY BEFORE EACH MEAL. (60-20-60 UNITS) 40 mL 11  . lamoTRIgine (LAMICTAL) 25 MG tablet TAKE 1 TABLET TWICE DAILY 180 tablet 1  . Lancet Devices (EASY TOUCH LANCING DEVICE) MISC     . levothyroxine (SYNTHROID, LEVOTHROID) 75 MCG tablet TAKE 1 TABLET EVERY DAY 90 tablet 3  . lidocaine (XYLOCAINE)  5 % ointment Apply 1 application topically as needed. 35.44 g 0  . ondansetron (ZOFRAN) 4 MG tablet Take 1 tablet (4 mg total) by mouth every 8 (eight) hours as needed for nausea or vomiting. 20 tablet 0  . PRESCRIPTION MEDICATION Apply 1-2 g topically See admin instructions. Apply 1-2 grams (1-2 ML) to affected area 2 to 3 times a day Diclofenac/Baclofen/Bupivacane    . silver sulfADIAZINE (SILVADENE) 1 % cream Apply 1 application topically 2 (two) times daily. On the leg wound 25 g 0  . vitamin B-12 (CYANOCOBALAMIN) 1000 MCG tablet Take 1,000 mcg by mouth daily.      Marland Kitchen zolpidem (AMBIEN) 10 MG tablet Take 0.5-1 tablets (5-10 mg total) by mouth at bedtime as needed for sleep. 90 tablet 1  . clonazePAM (KLONOPIN) 0.5 MG tablet TAKE ONE TABLET BY MOUTH 2 TIMES A DAY AS NEEDED FOR ANXIETY 60 tablet 2  . oxyCODONE (ROXICODONE) 15 MG immediate release tablet Take 1-1.5 tablets (15-22.5 mg total) by mouth every 6 (six) hours as needed for pain. OK to fill early 120 tablet 0   No facility-administered medications prior to visit.    ROS Review of Systems  Constitutional: Negative for chills, activity change,  appetite change, fatigue and unexpected weight change.  HENT: Negative for congestion, mouth sores and sinus pressure.   Eyes: Negative for visual disturbance.  Respiratory: Negative for cough and chest tightness.   Gastrointestinal: Negative for nausea and abdominal pain.  Genitourinary: Negative for frequency, difficulty urinating and vaginal pain.  Musculoskeletal: Positive for back pain, arthralgias and gait problem.  Skin: Negative for pallor and rash.  Neurological: Negative for dizziness, tremors, weakness, numbness and headaches.  Psychiatric/Behavioral: Positive for decreased concentration. Negative for confusion and sleep disturbance. The patient is nervous/anxious.     Objective:  BP 118/74 mmHg  Pulse 98  Wt 255 lb (115.667 kg)  SpO2 96%  BP Readings from Last 3 Encounters:    08/12/15 118/74  08/12/15 144/84  07/24/15 110/70    Wt Readings from Last 3 Encounters:  08/12/15 255 lb (115.667 kg)  08/12/15 247 lb (112.038 kg)  07/24/15 246 lb (111.585 kg)    Physical Exam  Constitutional: She appears well-developed. No distress.  HENT:  Head: Normocephalic.  Right Ear: External ear normal.  Left Ear: External ear normal.  Nose: Nose normal.  Mouth/Throat: Oropharynx is clear and moist.  Eyes: Conjunctivae are normal. Pupils are equal, round, and reactive to light. Right eye exhibits no discharge. Left eye exhibits no discharge.  Neck: Normal range of motion. Neck supple. No JVD present. No tracheal deviation present. No thyromegaly present.  Cardiovascular: Normal rate, regular rhythm and normal heart sounds.   Pulmonary/Chest: No stridor. No respiratory distress. She has no wheezes.  Abdominal: Soft. Bowel sounds are normal. She exhibits no distension and no mass. There is no tenderness. There is no rebound and no guarding.  Musculoskeletal: She exhibits tenderness. She exhibits no edema.  Lymphadenopathy:    She has no cervical adenopathy.  Neurological: She displays normal reflexes. No cranial nerve deficit. She exhibits normal muscle tone. Coordination normal.  Skin: No rash noted. No erythema.  Psychiatric: She has a normal mood and affect. Her behavior is normal. Judgment and thought content normal.   R foot is in a boot Lab Results  Component Value Date   WBC 11.7* 06/16/2015   HGB 16.0* 06/16/2015   HCT 47.7* 06/16/2015   PLT 315.0 06/16/2015   GLUCOSE 104* 06/16/2015   CHOL 218* 07/08/2014   TRIG * 07/08/2014    460.0 Triglyceride is over 400; calculations on Lipids are invalid.   HDL 30.90* 07/08/2014   LDLDIRECT 125.3 07/08/2014   LDLCALC 81 01/02/2013   ALT 11 01/28/2014   AST 17 01/28/2014   NA 138 06/16/2015   K 4.4 06/16/2015   CL 99 06/16/2015   CREATININE 0.77 06/16/2015   BUN 12 06/16/2015   CO2 32 06/16/2015   TSH 1.94  06/16/2015   INR 0.95 01/28/2014   HGBA1C 8.3 08/12/2015   MICROALBUR <0.7 03/12/2015    Ct Chest Wo Contrast  06/25/2015  CLINICAL DATA:  Abnormal chest x-ray with progressive shortness of breath. EXAM: CT CHEST WITHOUT CONTRAST TECHNIQUE: Multidetector CT imaging of the chest was performed following the standard protocol without IV contrast. COMPARISON:  Chest x-ray dated 06/16/2015. FINDINGS: Heart size is normal. No pericardial effusion. Thoracic aorta is normal in caliber and configuration. Scattered small lymph nodes noted within the mediastinum. No mass or enlarged lymph nodes within the mediastinum or perihilar regions. Lungs are clear. No evidence of pneumonia. No pleural effusion. No pulmonary nodule or mass. No interstitial thickening. Trachea and central bronchi appear normal. Limited images of the upper  abdomen are unremarkable. Mild degenerative change noted within the thoracic spine but no acute osseous abnormality. IMPRESSION: Essentially normal chest CT. No acute findings. Lungs are clear and mediastinum appears normal. The nodular opacity identified in the right suprahilar region on recent chest x-ray was likely due to superimposition of normal suprahilar vessels. Electronically Signed   By: Franki Cabot M.D.   On: 06/25/2015 14:39    Assessment & Plan:   Diagnoses and all orders for this visit:  B12 deficiency  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)  Pain in joint, ankle and foot, right  DOE (dyspnea on exertion)  Need for influenza vaccination -     Flu Vaccine QUAD 36+ mos IM  Other orders -     clonazePAM (KLONOPIN) 0.5 MG tablet; TAKE ONE TABLET BY MOUTH 2 TIMES A DAY AS NEEDED FOR ANXIETY -     Discontinue: oxyCODONE (ROXICODONE) 15 MG immediate release tablet; Take 1-1.5 tablets (15-22.5 mg total) by mouth every 6 (six) hours as needed for pain. Please fill on or after 08/24/15 -     Discontinue: oxyCODONE (ROXICODONE) 15 MG immediate release  tablet; Take 1-1.5 tablets (15-22.5 mg total) by mouth every 6 (six) hours as needed for pain. Please fill on or after 09/24/15 -     oxyCODONE (ROXICODONE) 15 MG immediate release tablet; Take 1-1.5 tablets (15-22.5 mg total) by mouth every 6 (six) hours as needed for pain. Please fill on or after 10/24/15   I have discontinued Ms. Bogle's oxyCODONE and oxyCODONE. I have also changed her oxyCODONE. Additionally, I am having her maintain her estradiol, vitamin B-12, cholecalciferol, BD PEN NEEDLE NANO U/F, EASY TOUCH LANCING DEVICE, PRESCRIPTION MEDICATION, baclofen, ACCU-CHEK SOFTCLIX LANCETS, gabapentin, ezetimibe, lidocaine, B-D INS SYR ULTRAFINE 1CC/31G, lamoTRIgine, levothyroxine, zolpidem, ACCU-CHEK AVIVA PLUS, Albuterol Sulfate, aspirin, B-D INS SYRINGE 0.5CC/31GX5/16, ondansetron, doxycycline, silver sulfADIAZINE, insulin regular, insulin NPH Human, and clonazePAM.  Meds ordered this encounter  Medications  . clonazePAM (KLONOPIN) 0.5 MG tablet    Sig: TAKE ONE TABLET BY MOUTH 2 TIMES A DAY AS NEEDED FOR ANXIETY    Dispense:  60 tablet    Refill:  2  . DISCONTD: oxyCODONE (ROXICODONE) 15 MG immediate release tablet    Sig: Take 1-1.5 tablets (15-22.5 mg total) by mouth every 6 (six) hours as needed for pain. Please fill on or after 08/24/15    Dispense:  120 tablet    Refill:  0  . DISCONTD: oxyCODONE (ROXICODONE) 15 MG immediate release tablet    Sig: Take 1-1.5 tablets (15-22.5 mg total) by mouth every 6 (six) hours as needed for pain. Please fill on or after 09/24/15    Dispense:  120 tablet    Refill:  0  . oxyCODONE (ROXICODONE) 15 MG immediate release tablet    Sig: Take 1-1.5 tablets (15-22.5 mg total) by mouth every 6 (six) hours as needed for pain. Please fill on or after 10/24/15    Dispense:  120 tablet    Refill:  0     Follow-up: Return in about 3 months (around 11/12/2015) for a follow-up visit.  Walker Kehr, MD

## 2015-08-12 NOTE — Progress Notes (Signed)
Pre visit review using our clinic review tool, if applicable. No additional management support is needed unless otherwise documented below in the visit note. 

## 2015-08-12 NOTE — Assessment & Plan Note (Signed)
In a boot

## 2015-08-12 NOTE — Assessment & Plan Note (Signed)
On Insulin NPH 

## 2015-08-12 NOTE — Patient Instructions (Addendum)
check your blood sugar twice a day.  vary the time of day when you check, between before the 3 meals, and at bedtime.  also check if you have symptoms of your blood sugar being too high or too low.  please keep a record of the readings and bring it to your next appointment here.  please call us sooner if your blood sugar goes below 70, or if it stays over 200.   Please make every effort to remember the bedtime insulin.  please come back for a follow-up appointment in 2 months.

## 2015-08-12 NOTE — Assessment & Plan Note (Signed)
On B12 

## 2015-08-14 ENCOUNTER — Other Ambulatory Visit: Payer: Self-pay | Admitting: Internal Medicine

## 2015-08-14 ENCOUNTER — Other Ambulatory Visit: Payer: Self-pay | Admitting: Endocrinology

## 2015-08-18 ENCOUNTER — Encounter: Payer: Self-pay | Admitting: Internal Medicine

## 2015-08-18 MED ORDER — BACLOFEN 10 MG PO TABS: 20.0000 mg | ORAL_TABLET | Freq: Two times a day (BID) | ORAL | Status: DC

## 2015-08-31 ENCOUNTER — Ambulatory Visit: Payer: Medicare PPO | Admitting: Podiatry

## 2015-09-04 ENCOUNTER — Ambulatory Visit: Payer: Medicare PPO | Admitting: Podiatry

## 2015-09-11 ENCOUNTER — Telehealth: Payer: Self-pay | Admitting: *Deleted

## 2015-09-11 MED ORDER — OXYCODONE HCL 15 MG PO TABS: 15.0000 mg | ORAL_TABLET | Freq: Four times a day (QID) | ORAL | Status: DC | PRN

## 2015-09-11 NOTE — Telephone Encounter (Signed)
Patient had to double up on her existing oxycodone meds when she broke her foot, so she is currently out of oxycodone and is not supposed to get another refill until 11/22---can you give her oxycodone rx for 12 days until she can refill the first prescription of oxycodone she has---please advise, i will call patient back

## 2015-09-11 NOTE — Telephone Encounter (Signed)
Pt left vm stating she is having foot surgery on the 22nd. They gave her dilaudid and it makes her itch. She states they advised her to call PCP for some different break through pain management. Please advise.

## 2015-09-11 NOTE — Telephone Encounter (Signed)
Pt called back. I informed her we are waiting on Dr.'s response. She was hoping to hear back from you today.

## 2015-09-11 NOTE — Telephone Encounter (Signed)
Ok to fill early Thx 

## 2015-09-11 NOTE — Addendum Note (Signed)
Addended by: Cassandria Anger on: 09/11/2015 05:26 PM   Modules accepted: Orders

## 2015-09-11 NOTE — Telephone Encounter (Signed)
Take 1/2 or 1 extra oxycodone a day for a few days Thx

## 2015-09-12 NOTE — Telephone Encounter (Signed)
Pt informed script at front 

## 2015-09-28 ENCOUNTER — Other Ambulatory Visit: Payer: Self-pay | Admitting: Endocrinology

## 2015-10-12 ENCOUNTER — Ambulatory Visit (INDEPENDENT_AMBULATORY_CARE_PROVIDER_SITE_OTHER): Payer: Medicare PPO | Admitting: Endocrinology

## 2015-10-12 ENCOUNTER — Encounter: Payer: Self-pay | Admitting: Endocrinology

## 2015-10-12 VITALS — BP 132/70 | HR 82 | Temp 98.1°F | Ht 69.5 in | Wt 247.0 lb

## 2015-10-12 DIAGNOSIS — E1140 Type 2 diabetes mellitus with diabetic neuropathy, unspecified: Secondary | ICD-10-CM | POA: Diagnosis not present

## 2015-10-12 LAB — POCT GLYCOSYLATED HEMOGLOBIN (HGB A1C): Hemoglobin A1C: 7.5

## 2015-10-12 MED ORDER — INSULIN REGULAR HUMAN 100 UNIT/ML IJ SOLN: INTRAMUSCULAR | Status: DC

## 2015-10-12 NOTE — Progress Notes (Signed)
Subjective:    Patient ID: Katherine Herrera, female    DOB: 23-Sep-1966, 49 y.o.   MRN: [id number]  HPI Pt returns for f/u of diabetes mellitus: DM type: Insulin-requiring type 2 Dx'ed: 123456 Complications: polyneuropathy and foot ulcers.  Therapy: insulin since 2012.   GDM: never. DKA: never Severe hypoglycemia: last episode was in late 2013.   Pancreatitis: never. Other: she takes multiple daily injections; she takes human insulin, for cost reasons.   Interval history: She seldom checks cbg's.  She denies sxs of hypoglycemia.  She says she now seldom  misses the insulin injections.  no cbg record, but states cbg's are seldom low.  This usually happens in the afternoon.  It is highest at lunch (high-100's).  Past Medical History  Diagnosis Date  . Syncope     poss conversion disorder vs psychogenic seizures  . DM (diabetes mellitus) ([place])   . Hypothyroidism   . LBP (low back pain)     DR Patrice Paradise  . Peripheral neuropathy ([place])   . COPD (chronic obstructive pulmonary disease) ([place])   . Vitamin D deficiency   . Migraine   . Hyperlipidemia   . Anxiety   . Gout     possible  . Tachycardia     diet coke overuse?  . TTS (tarsal tunnel syndrome) 2010    Left Foot [facility] in [place]  . Tenosynovitis of foot and ankle 09/03/2013    Past Surgical History  Procedure Laterality Date  . Cervical fusion      C4-5  . Abdominal hysterectomy    . Carpal tunnel release  2010    LEFT    Social History   Social History  . Marital Status: Married    Spouse Name: N/A  . Number of Children: N/A  . Years of Education: N/A   Occupational History  . Not on file.   Social History Main Topics  . Smoking status: Former Smoker    Quit date: 12/01/2009  . Smokeless tobacco: Never Used  . Alcohol Use: No  . Drug Use: No  . Sexual Activity: Yes   Other Topics Concern  . Not on file   Social History Narrative   GYN - Dr Radene Knee      FAMILY HISTORY   Lung cancer   Mother w/alcoholism     Married 3d GS due Nov   Diet Coke: 10 cans a day      Regular exercise - NO      Former smoker 2009 restared 2010 stopped 12/2009    Current Outpatient Prescriptions on File Prior to Visit  Medication Sig Dispense Refill  . ACCU-CHEK AVIVA PLUS test strip 3 (three) times a week.    Marland Kitchen ACCU-CHEK SOFTCLIX LANCETS lancets TEST TWO TIMES DAILY 200 each 3  . Albuterol Sulfate (PROAIR RESPICLICK) 123XX123 (90 BASE) MCG/ACT AEPB Inhale 1-2 puffs into the lungs 4 (four) times daily as needed. 1 each 5  . aspirin (BAYER ASPIRIN) 325 MG tablet Take 1 tablet (325 mg total) by mouth daily. 100 tablet 3  . B-D INS SYR ULTRAFINE 1CC/31G 31G X 5/16" 1 ML MISC USE AS DIRECTED 100 each 2  . B-D INS SYRINGE 0.5CC/31GX5/16 31G X 5/16" 0.5 ML MISC USE AS DIRECTED WITH INSULIN 4 TIMES A DAY 100 each 0  . baclofen (LIORESAL) 10 MG tablet Take 2 tablets (20 mg total) by mouth 2 (two) times daily. 360 tablet 1  . BD PEN NEEDLE NANO U/F 32G X 4  MM MISC     . Cholecalciferol (VITAMIN D3) 1000 UNIT tablet Take 2,000 Units by mouth daily.      . clonazePAM (KLONOPIN) 0.5 MG tablet TAKE ONE TABLET BY MOUTH 2 TIMES A DAY AS NEEDED FOR ANXIETY 60 tablet 2  . estradiol (ESTRACE) 1 MG tablet Take 1 mg by mouth daily.      Marland Kitchen ezetimibe (ZETIA) 10 MG tablet Take 1 tablet (10 mg total) by mouth daily. 90 tablet 1  . gabapentin (NEURONTIN) 800 MG tablet TAKE 1 TABLET FOUR TIMES DAILY 360 tablet 3  . insulin NPH Human (HUMULIN N) 100 UNIT/ML injection Inject 0.35 mLs (35 Units total) into the skin at bedtime. 20 mL 11  . lamoTRIgine (LAMICTAL) 25 MG tablet TAKE 1 TABLET TWICE DAILY 180 tablet 1  . Lancet Devices (EASY TOUCH LANCING DEVICE) MISC     . levothyroxine (SYNTHROID, LEVOTHROID) 75 MCG tablet TAKE 1 TABLET EVERY DAY 90 tablet 3  . lidocaine (XYLOCAINE) 5 % ointment Apply 1 application topically as needed. 35.44 g 0  . ondansetron (ZOFRAN) 4 MG tablet Take 1 tablet (4 mg total) by mouth every 8 (eight) hours as needed for  nausea or vomiting. 20 tablet 0  . oxyCODONE (ROXICODONE) 15 MG immediate release tablet Take 1-1.5 tablets (15-22.5 mg total) by mouth every 6 (six) hours as needed for pain. Please fill on or after 09/11/15. Ok to fill early due to foot fracture 120 tablet 0  . PRESCRIPTION MEDICATION Apply 1-2 g topically See admin instructions. Apply 1-2 grams (1-2 ML) to affected area 2 to 3 times a day Diclofenac/Baclofen/Bupivacane    . silver sulfADIAZINE (SILVADENE) 1 % cream Apply 1 application topically 2 (two) times daily. On the leg wound 25 g 0  . traMADol (ULTRAM) 50 MG tablet TAKE 1 TO 2 TABLETS TWICE DAILY AS NEEDED FOR HEADACHE 60 tablet 0  . vitamin B-12 (CYANOCOBALAMIN) 1000 MCG tablet Take 1,000 mcg by mouth daily.      Marland Kitchen zolpidem (AMBIEN) 10 MG tablet TAKE 1/2 TO 1 TABLET AT BEDTIME AS NEEDED FOR INSOMNIA 30 tablet 0   No current facility-administered medications on file prior to visit.    Allergies  Allergen Reactions  . Crestor [Rosuvastatin Calcium]     myalgia  . Lovastatin Other (See Comments)    Unknown   . Metformin Nausea And Vomiting  . Niacin Other (See Comments)    Unknown   . Paroxetine Other (See Comments)    Unknown   . Meperidine Hcl Rash    Family History  Problem Relation Age of Onset  . Diabetes Mother   . Hypertension Mother   . Cancer Father 32    ? bone cancer  . [name] Brother     BP 132/70 mmHg  Pulse 82  Temp(Src) 98.1 F (36.7 C) (Oral)  Ht 5' 9.5" (1.765 m)  Wt 247 lb (112.038 kg)  BMI 35.96 kg/m2  SpO2 92%  Review of Systems Denies LOC.      Objective:   Physical Exam VITAL SIGNS:  See vs page GENERAL: no distress Pulses: dorsalis pedis intact bilat.   MSK: no deformity of the feet CV: trace bilat leg edema Skin: Multiple healed ulcers at the left leg and foot.  there is a healing surgical scar at the lateral aspect of the right foot .  normal color and temp on the feet. Neuro: sensation is intact to touch on the feet,  but decreased from normal..  A1c=7.5%    Assessment & Plan:  DM: The pattern of his cbg's indicates she needs some adjustment in his therapy.  Patient is advised the following: Patient Instructions  check your blood sugar twice a day.  vary the time of day when you check, between before the 3 meals, and at bedtime.  also check if you have symptoms of your blood sugar being too high or too low.  please keep a record of the readings and bring it to your next appointment here.  please call us sooner if your blood sugar goes below 70, or if it stays over 200.   Please change the reg insulin to 3 times a day (just before each meal) 70-15-60 units.  Please continue the same NPH at bedtime.   please come back for a follow-up appointment in 3 months.

## 2015-10-12 NOTE — Patient Instructions (Addendum)
check your blood sugar twice a day.  vary the time of day when you check, between before the 3 meals, and at bedtime.  also check if you have symptoms of your blood sugar being too high or too low.  please keep a record of the readings and bring it to your next appointment here.  please call us sooner if your blood sugar goes below 70, or if it stays over 200.   Please change the reg insulin to 3 times a day (just before each meal) 70-15-60 units.  Please continue the same NPH at bedtime.   please come back for a follow-up appointment in 3 months.

## 2015-10-30 ENCOUNTER — Other Ambulatory Visit: Payer: Self-pay | Admitting: Internal Medicine

## 2015-11-03 ENCOUNTER — Other Ambulatory Visit: Payer: Self-pay | Admitting: Endocrinology

## 2015-11-11 ENCOUNTER — Ambulatory Visit (INDEPENDENT_AMBULATORY_CARE_PROVIDER_SITE_OTHER): Payer: Medicare Other | Admitting: Internal Medicine

## 2015-11-11 ENCOUNTER — Encounter: Payer: Self-pay | Admitting: Internal Medicine

## 2015-11-11 VITALS — BP 110/72 | HR 98 | Wt 247.0 lb

## 2015-11-11 DIAGNOSIS — R21 Rash and other nonspecific skin eruption: Secondary | ICD-10-CM | POA: Diagnosis not present

## 2015-11-11 DIAGNOSIS — R600 Localized edema: Secondary | ICD-10-CM | POA: Diagnosis not present

## 2015-11-11 DIAGNOSIS — M5440 Lumbago with sciatica, unspecified side: Secondary | ICD-10-CM | POA: Diagnosis not present

## 2015-11-11 MED ORDER — OXYCODONE HCL 15 MG PO TABS: 15.0000 mg | ORAL_TABLET | Freq: Four times a day (QID) | ORAL | Status: DC | PRN

## 2015-11-11 MED ORDER — CLOTRIMAZOLE-BETAMETHASONE 1-0.05 % EX CREA: 1.0000 "application " | TOPICAL_CREAM | Freq: Two times a day (BID) | CUTANEOUS | Status: DC

## 2015-11-11 MED ORDER — CLONAZEPAM 0.5 MG PO TABS: ORAL_TABLET | ORAL | Status: DC

## 2015-11-11 NOTE — Progress Notes (Signed)
Subjective:  Patient ID: Katherine Herrera, female    DOB: 01/30/1966  Age: 50 y.o. MRN: [id number]  CC: No chief complaint on file.   HPI Katherine Herrera presents for DM, HTN, chronic pain f/u C/o rash behind R and L ear  Outpatient Prescriptions Prior to Visit  Medication Sig Dispense Refill  . ACCU-CHEK AVIVA PLUS test strip 3 (three) times a week.    Marland Kitchen ACCU-CHEK SOFTCLIX LANCETS lancets TEST TWO TIMES DAILY 200 each 3  . Albuterol Sulfate (PROAIR RESPICLICK) 123XX123 (90 BASE) MCG/ACT AEPB Inhale 1-2 puffs into the lungs 4 (four) times daily as needed. 1 each 5  . aspirin (BAYER ASPIRIN) 325 MG tablet Take 1 tablet (325 mg total) by mouth daily. 100 tablet 3  . B-D INS SYR ULTRAFINE 1CC/31G 31G X 5/16" 1 ML MISC USE AS DIRECTED 100 each 0  . B-D INS SYRINGE 0.5CC/31GX5/16 31G X 5/16" 0.5 ML MISC USE AS DIRECTED WITH INSULIN 4 TIMES A DAY 100 each 0  . baclofen (LIORESAL) 10 MG tablet Take 2 tablets (20 mg total) by mouth 2 (two) times daily. 360 tablet 1  . BD PEN NEEDLE NANO U/F 32G X 4 MM MISC     . Cholecalciferol (VITAMIN D3) 1000 UNIT tablet Take 2,000 Units by mouth daily.      . clonazePAM (KLONOPIN) 0.5 MG tablet TAKE ONE TABLET BY MOUTH 2 TIMES A DAY AS NEEDED FOR ANXIETY 60 tablet 2  . estradiol (ESTRACE) 1 MG tablet Take 1 mg by mouth daily.      Marland Kitchen gabapentin (NEURONTIN) 800 MG tablet TAKE 1 TABLET FOUR TIMES DAILY 360 tablet 3  . insulin NPH Human (HUMULIN N) 100 UNIT/ML injection Inject 0.35 mLs (35 Units total) into the skin at bedtime. 20 mL 11  . insulin regular (HUMULIN R) 100 units/mL injection INJECT 3 TIMES A DAY BEFORE EACH MEAL. (70-15-60 UNITS) 50 mL 11  . lamoTRIgine (LAMICTAL) 25 MG tablet TAKE 1 TABLET TWICE DAILY 180 tablet 1  . Lancet Devices (EASY TOUCH LANCING DEVICE) MISC     . levothyroxine (SYNTHROID, LEVOTHROID) 75 MCG tablet TAKE 1 TABLET EVERY DAY 90 tablet 3  . lidocaine (XYLOCAINE) 5 % ointment Apply 1 application topically as needed. 35.44 g 0  .  ondansetron (ZOFRAN) 4 MG tablet Take 1 tablet (4 mg total) by mouth every 8 (eight) hours as needed for nausea or vomiting. 20 tablet 0  . oxyCODONE (ROXICODONE) 15 MG immediate release tablet Take 1-1.5 tablets (15-22.5 mg total) by mouth every 6 (six) hours as needed for pain. Please fill on or after 09/11/15. Ok to fill early due to foot fracture 120 tablet 0  . PRESCRIPTION MEDICATION Apply 1-2 g topically See admin instructions. Apply 1-2 grams (1-2 ML) to affected area 2 to 3 times a day Diclofenac/Baclofen/Bupivacane    . silver sulfADIAZINE (SILVADENE) 1 % cream Apply 1 application topically 2 (two) times daily. On the leg wound 25 g 0  . traMADol (ULTRAM) 50 MG tablet TAKE 1 TO 2 TABLETS TWICE DAILY AS NEEDED FOR HEADACHE 60 tablet 0  . vitamin B-12 (CYANOCOBALAMIN) 1000 MCG tablet Take 1,000 mcg by mouth daily.      Marland Kitchen ZETIA 10 MG tablet TAKE ONE TABLET BY MOUTH EVERY DAY 30 tablet 11  . zolpidem (AMBIEN) 10 MG tablet TAKE 1/2 TO 1 TABLET AT BEDTIME AS NEEDED FOR INSOMNIA 30 tablet 0   No facility-administered medications prior to visit.    ROS  Review of Systems  Constitutional: Positive for unexpected weight change. Negative for chills, activity change, appetite change and fatigue.  HENT: Negative for congestion, mouth sores and sinus pressure.   Eyes: Negative for visual disturbance.  Respiratory: Negative for cough and chest tightness.   Gastrointestinal: Negative for nausea and abdominal pain.  Genitourinary: Negative for frequency, difficulty urinating and vaginal pain.  Musculoskeletal: Positive for back pain, arthralgias and gait problem.  Skin: Positive for rash. Negative for pallor.  Neurological: Negative for dizziness, tremors, weakness, numbness and headaches.  Psychiatric/Behavioral: Negative for suicidal ideas, confusion and sleep disturbance.   R foot hurts post-op Objective:  BP 110/72 mmHg  Pulse 98  Wt 247 lb (112.038 kg)  SpO2 97%  BP Readings from Last 3  Encounters:  11/11/15 110/72  10/12/15 132/70  08/12/15 118/74    Wt Readings from Last 3 Encounters:  11/11/15 247 lb (112.038 kg)  10/12/15 247 lb (112.038 kg)  08/12/15 255 lb (115.667 kg)    Physical Exam  Constitutional: She appears well-developed. No distress.  HENT:  Head: Normocephalic.  Right Ear: External ear normal.  Left Ear: External ear normal.  Nose: Nose normal.  Mouth/Throat: Oropharynx is clear and moist.  Eyes: Conjunctivae are normal. Pupils are equal, round, and reactive to light. Right eye exhibits no discharge. Left eye exhibits no discharge.  Neck: Normal range of motion. Neck supple. No JVD present. No tracheal deviation present. No thyromegaly present.  Cardiovascular: Normal rate, regular rhythm and normal heart sounds.   Pulmonary/Chest: No stridor. No respiratory distress. She has no wheezes.  Abdominal: Soft. Bowel sounds are normal. She exhibits no distension and no mass. There is no tenderness. There is no rebound and no guarding.  Musculoskeletal: She exhibits edema and tenderness.  Lymphadenopathy:    She has no cervical adenopathy.  Neurological: She displays normal reflexes. No cranial nerve deficit. She exhibits normal muscle tone. Coordination abnormal.  Skin: Rash noted. There is erythema.  Psychiatric: She has a normal mood and affect. Her behavior is normal. Judgment and thought content normal.  On a knee scooter Psoriatic rash behind B ears LS tender  Lab Results  Component Value Date   WBC 11.7* 06/16/2015   HGB 16.0* 06/16/2015   HCT 47.7* 06/16/2015   PLT 315.0 06/16/2015   GLUCOSE 104* 06/16/2015   CHOL 218* 07/08/2014   TRIG * 07/08/2014    460.0 Triglyceride is over 400; calculations on Lipids are invalid.   HDL 30.90* 07/08/2014   LDLDIRECT 125.3 07/08/2014   LDLCALC 81 01/02/2013   ALT 11 01/28/2014   AST 17 01/28/2014   NA 138 06/16/2015   K 4.4 06/16/2015   CL 99 06/16/2015   CREATININE 0.77 06/16/2015   BUN 12  06/16/2015   CO2 32 06/16/2015   TSH 1.94 06/16/2015   INR 0.95 01/28/2014   HGBA1C 7.5 10/12/2015   MICROALBUR <0.7 03/12/2015    Ct Chest Wo Contrast  06/25/2015  CLINICAL DATA:  Abnormal chest x-ray with progressive shortness of breath. EXAM: CT CHEST WITHOUT CONTRAST TECHNIQUE: Multidetector CT imaging of the chest was performed following the standard protocol without IV contrast. COMPARISON:  Chest x-ray dated 06/16/2015. FINDINGS: Heart size is normal. No pericardial effusion. Thoracic aorta is normal in caliber and configuration. Scattered small lymph nodes noted within the mediastinum. No mass or enlarged lymph nodes within the mediastinum or perihilar regions. Lungs are clear. No evidence of pneumonia. No pleural effusion. No pulmonary nodule or mass. No interstitial thickening.  Trachea and central bronchi appear normal. Limited images of the upper abdomen are unremarkable. Mild degenerative change noted within the thoracic spine but no acute osseous abnormality. IMPRESSION: Essentially normal chest CT. No acute findings. Lungs are clear and mediastinum appears normal. The nodular opacity identified in the right suprahilar region on recent chest x-ray was likely due to superimposition of normal suprahilar vessels. Electronically Signed   By: Franki Cabot M.D.   On: 06/25/2015 14:39    Assessment & Plan:   There are no diagnoses linked to this encounter. I am having Katherine Herrera maintain her estradiol, vitamin B-12, cholecalciferol, BD PEN NEEDLE NANO U/F, EASY TOUCH LANCING DEVICE, PRESCRIPTION MEDICATION, ACCU-CHEK SOFTCLIX LANCETS, lidocaine, lamoTRIgine, levothyroxine, ACCU-CHEK AVIVA PLUS, Albuterol Sulfate, aspirin, ondansetron, silver sulfADIAZINE, insulin NPH Human, clonazePAM, gabapentin, traMADol, zolpidem, baclofen, oxyCODONE, insulin regular, ZETIA, B-D INS SYRINGE 0.5CC/31GX5/16, and B-D INS SYR ULTRAFINE 1CC/31G.  No orders of the defined types were placed in this encounter.       Follow-up: No Follow-up on file.  Walker Kehr, MD

## 2015-11-11 NOTE — Assessment & Plan Note (Signed)
Psoriatic rash behind B ears Rx - Lotrisone bid

## 2015-11-11 NOTE — Progress Notes (Signed)
Pre visit review using our clinic review tool, if applicable. No additional management support is needed unless otherwise documented below in the visit note. 

## 2015-11-11 NOTE — Assessment & Plan Note (Signed)
Chronic OA On Oxycodone  Potential benefits of a long term opioids use as well as potential risks (i.e. addiction risk, apnea etc) and complications (i.e. Somnolence, constipation and others) were explained to the patient and were aknowledged. 

## 2015-11-11 NOTE — Assessment & Plan Note (Signed)
Better  

## 2015-11-12 DIAGNOSIS — Z8781 Personal history of (healed) traumatic fracture: Secondary | ICD-10-CM | POA: Diagnosis not present

## 2015-11-12 DIAGNOSIS — Z967 Presence of other bone and tendon implants: Secondary | ICD-10-CM | POA: Diagnosis not present

## 2015-11-12 DIAGNOSIS — L0390 Cellulitis, unspecified: Secondary | ICD-10-CM | POA: Diagnosis not present

## 2015-11-24 ENCOUNTER — Encounter: Payer: Self-pay | Admitting: Internal Medicine

## 2015-11-25 ENCOUNTER — Other Ambulatory Visit: Payer: Self-pay

## 2015-11-25 MED ORDER — FLUCONAZOLE 150 MG PO TABS: 150.0000 mg | ORAL_TABLET | Freq: Once | ORAL | Status: DC

## 2015-11-25 MED ORDER — GABAPENTIN 800 MG PO TABS: ORAL_TABLET | ORAL | Status: DC

## 2015-11-27 ENCOUNTER — Other Ambulatory Visit: Payer: Self-pay

## 2015-11-27 DIAGNOSIS — S92351S Displaced fracture of fifth metatarsal bone, right foot, sequela: Secondary | ICD-10-CM | POA: Diagnosis not present

## 2015-11-27 DIAGNOSIS — E119 Type 2 diabetes mellitus without complications: Secondary | ICD-10-CM | POA: Diagnosis not present

## 2015-11-27 MED ORDER — LEVOTHYROXINE SODIUM 75 MCG PO TABS: 75.0000 ug | ORAL_TABLET | Freq: Every day | ORAL | Status: DC

## 2015-11-27 MED ORDER — LAMOTRIGINE 25 MG PO TABS: 25.0000 mg | ORAL_TABLET | Freq: Two times a day (BID) | ORAL | Status: DC

## 2015-11-27 MED ORDER — GABAPENTIN 800 MG PO TABS: ORAL_TABLET | ORAL | Status: DC

## 2015-12-04 MED ORDER — ZOLPIDEM TARTRATE 10 MG PO TABS: 5.0000 mg | ORAL_TABLET | Freq: Every day | ORAL | Status: DC

## 2015-12-04 NOTE — Telephone Encounter (Signed)
Rx refill called into [facility])

## 2015-12-14 DIAGNOSIS — S92351S Displaced fracture of fifth metatarsal bone, right foot, sequela: Secondary | ICD-10-CM | POA: Diagnosis not present

## 2015-12-15 ENCOUNTER — Encounter: Payer: Self-pay | Admitting: Internal Medicine

## 2015-12-23 DIAGNOSIS — S92351S Displaced fracture of fifth metatarsal bone, right foot, sequela: Secondary | ICD-10-CM | POA: Diagnosis not present

## 2015-12-28 DIAGNOSIS — M79671 Pain in right foot: Secondary | ICD-10-CM | POA: Diagnosis not present

## 2016-01-06 DIAGNOSIS — T85848A Pain due to other internal prosthetic devices, implants and grafts, initial encounter: Secondary | ICD-10-CM | POA: Insufficient documentation

## 2016-01-07 DIAGNOSIS — Z79890 Hormone replacement therapy (postmenopausal): Secondary | ICD-10-CM | POA: Diagnosis not present

## 2016-01-07 DIAGNOSIS — E119 Type 2 diabetes mellitus without complications: Secondary | ICD-10-CM | POA: Diagnosis not present

## 2016-01-07 DIAGNOSIS — Z794 Long term (current) use of insulin: Secondary | ICD-10-CM | POA: Diagnosis not present

## 2016-01-07 DIAGNOSIS — F419 Anxiety disorder, unspecified: Secondary | ICD-10-CM | POA: Diagnosis not present

## 2016-01-07 DIAGNOSIS — E7800 Pure hypercholesterolemia, unspecified: Secondary | ICD-10-CM | POA: Diagnosis not present

## 2016-01-07 DIAGNOSIS — T8484XA Pain due to internal orthopedic prosthetic devices, implants and grafts, initial encounter: Secondary | ICD-10-CM | POA: Diagnosis not present

## 2016-01-07 DIAGNOSIS — E0590 Thyrotoxicosis, unspecified without thyrotoxic crisis or storm: Secondary | ICD-10-CM | POA: Diagnosis not present

## 2016-01-07 DIAGNOSIS — Z888 Allergy status to other drugs, medicaments and biological substances status: Secondary | ICD-10-CM | POA: Diagnosis not present

## 2016-01-07 DIAGNOSIS — Z886 Allergy status to analgesic agent status: Secondary | ICD-10-CM | POA: Diagnosis not present

## 2016-01-07 DIAGNOSIS — Z79891 Long term (current) use of opiate analgesic: Secondary | ICD-10-CM | POA: Diagnosis not present

## 2016-01-07 DIAGNOSIS — S92351D Displaced fracture of fifth metatarsal bone, right foot, subsequent encounter for fracture with routine healing: Secondary | ICD-10-CM | POA: Diagnosis not present

## 2016-01-07 DIAGNOSIS — J449 Chronic obstructive pulmonary disease, unspecified: Secondary | ICD-10-CM | POA: Diagnosis not present

## 2016-01-07 DIAGNOSIS — F17210 Nicotine dependence, cigarettes, uncomplicated: Secondary | ICD-10-CM | POA: Diagnosis not present

## 2016-01-07 DIAGNOSIS — E1140 Type 2 diabetes mellitus with diabetic neuropathy, unspecified: Secondary | ICD-10-CM | POA: Diagnosis not present

## 2016-01-13 ENCOUNTER — Ambulatory Visit (INDEPENDENT_AMBULATORY_CARE_PROVIDER_SITE_OTHER): Payer: Medicare Other | Admitting: Internal Medicine

## 2016-01-13 ENCOUNTER — Encounter: Payer: Self-pay | Admitting: Internal Medicine

## 2016-01-13 VITALS — BP 124/72 | HR 88 | Wt 245.0 lb

## 2016-01-13 DIAGNOSIS — S8000XA Contusion of unspecified knee, initial encounter: Secondary | ICD-10-CM | POA: Insufficient documentation

## 2016-01-13 DIAGNOSIS — L959 Vasculitis limited to the skin, unspecified: Secondary | ICD-10-CM | POA: Diagnosis not present

## 2016-01-13 DIAGNOSIS — S8002XA Contusion of left knee, initial encounter: Secondary | ICD-10-CM

## 2016-01-13 DIAGNOSIS — M5440 Lumbago with sciatica, unspecified side: Secondary | ICD-10-CM | POA: Diagnosis not present

## 2016-01-13 DIAGNOSIS — R635 Abnormal weight gain: Secondary | ICD-10-CM | POA: Diagnosis not present

## 2016-01-13 MED ORDER — OXYCODONE HCL 15 MG PO TABS: 15.0000 mg | ORAL_TABLET | Freq: Four times a day (QID) | ORAL | Status: DC | PRN

## 2016-01-13 NOTE — Progress Notes (Signed)
Subjective:  Patient ID: Katherine Herrera, female    DOB: 10-20-1966  Age: 50 y.o. MRN: [id number]  CC: No chief complaint on file.   HPI Katherine Herrera presents for chronic pain, anxiety F/u R ear rash - resolved Pt fell off of a knee scooter at 3 pm today and hurt her L knee  Outpatient Prescriptions Prior to Visit  Medication Sig Dispense Refill  . ACCU-CHEK AVIVA PLUS test strip 3 (three) times a week.    Marland Kitchen ACCU-CHEK SOFTCLIX LANCETS lancets TEST TWO TIMES DAILY 200 each 3  . Albuterol Sulfate (PROAIR RESPICLICK) 123XX123 (90 BASE) MCG/ACT AEPB Inhale 1-2 puffs into the lungs 4 (four) times daily as needed. 1 each 5  . aspirin (BAYER ASPIRIN) 325 MG tablet Take 1 tablet (325 mg total) by mouth daily. 100 tablet 3  . B-D INS SYR ULTRAFINE 1CC/31G 31G X 5/16" 1 ML MISC USE AS DIRECTED 100 each 0  . B-D INS SYRINGE 0.5CC/31GX5/16 31G X 5/16" 0.5 ML MISC USE AS DIRECTED WITH INSULIN 4 TIMES A DAY 100 each 0  . baclofen (LIORESAL) 10 MG tablet Take 2 tablets (20 mg total) by mouth 2 (two) times daily. 360 tablet 1  . BD PEN NEEDLE NANO U/F 32G X 4 MM MISC     . Cholecalciferol (VITAMIN D3) 1000 UNIT tablet Take 2,000 Units by mouth daily.      . clonazePAM (KLONOPIN) 0.5 MG tablet TAKE ONE TABLET BY MOUTH 2 TIMES A DAY AS NEEDED FOR ANXIETY 60 tablet 2  . estradiol (ESTRACE) 1 MG tablet Take 1 mg by mouth daily.      . fluconazole (DIFLUCAN) 150 MG tablet Take 1 tablet (150 mg total) by mouth once. 3 tablet 1  . gabapentin (NEURONTIN) 800 MG tablet TAKE 1 TABLET FOUR TIMES DAILY 360 tablet 2  . insulin NPH Human (HUMULIN N) 100 UNIT/ML injection Inject 0.35 mLs (35 Units total) into the skin at bedtime. 20 mL 11  . insulin regular (HUMULIN R) 100 units/mL injection INJECT 3 TIMES A DAY BEFORE EACH MEAL. (70-15-60 UNITS) 50 mL 11  . lamoTRIgine (LAMICTAL) 25 MG tablet Take 1 tablet (25 mg total) by mouth 2 (two) times daily. 180 tablet 1  . Lancet Devices (EASY TOUCH LANCING DEVICE) MISC       . levothyroxine (SYNTHROID, LEVOTHROID) 75 MCG tablet Take 1 tablet (75 mcg total) by mouth daily. 90 tablet 1  . lidocaine (XYLOCAINE) 5 % ointment Apply 1 application topically as needed. 35.44 g 0  . ondansetron (ZOFRAN) 4 MG tablet Take 1 tablet (4 mg total) by mouth every 8 (eight) hours as needed for nausea or vomiting. 20 tablet 0  . PRESCRIPTION MEDICATION Apply 1-2 g topically See admin instructions. Apply 1-2 grams (1-2 ML) to affected area 2 to 3 times a day Diclofenac/Baclofen/Bupivacane    . traMADol (ULTRAM) 50 MG tablet TAKE 1 TO 2 TABLETS TWICE DAILY AS NEEDED FOR HEADACHE 60 tablet 0  . vitamin B-12 (CYANOCOBALAMIN) 1000 MCG tablet Take 1,000 mcg by mouth daily.      Marland Kitchen ZETIA 10 MG tablet TAKE ONE TABLET BY MOUTH EVERY DAY 30 tablet 11  . zolpidem (AMBIEN) 10 MG tablet Take 0.5 tablets (5 mg total) by mouth at bedtime. 30 tablet 2  . oxyCODONE (ROXICODONE) 15 MG immediate release tablet Take 1-1.5 tablets (15-22.5 mg total) by mouth every 6 (six) hours as needed for pain. Please fill on or after 12/12/15. Ok to fill  early due to foot fracture 120 tablet 0  . clotrimazole-betamethasone (LOTRISONE) cream Apply 1 application topically 2 (two) times daily. (Patient not taking: Reported on 01/13/2016) 45 g 1  . silver sulfADIAZINE (SILVADENE) 1 % cream Apply 1 application topically 2 (two) times daily. On the leg wound (Patient not taking: Reported on 01/13/2016) 25 g 0   No facility-administered medications prior to visit.    ROS Review of Systems  Constitutional: Negative for fever, chills, activity change, appetite change, fatigue and unexpected weight change.  HENT: Negative for congestion, mouth sores and sinus pressure.   Eyes: Negative for visual disturbance.  Respiratory: Negative for cough and chest tightness.   Gastrointestinal: Negative for nausea and abdominal pain.  Genitourinary: Negative for frequency, difficulty urinating and vaginal pain.  Musculoskeletal: Positive  for back pain and arthralgias. Negative for gait problem.  Skin: Negative for pallor and rash.  Neurological: Negative for dizziness, tremors, weakness, numbness and headaches.  Psychiatric/Behavioral: Negative for confusion and sleep disturbance. The patient is nervous/anxious.     Objective:  BP 124/72 mmHg  Pulse 88  Wt 245 lb (111.131 kg)  SpO2 96%  BP Readings from Last 3 Encounters:  01/13/16 124/72  11/11/15 110/72  10/12/15 132/70    Wt Readings from Last 3 Encounters:  01/13/16 245 lb (111.131 kg)  11/11/15 247 lb (112.038 kg)  10/12/15 247 lb (112.038 kg)    Physical Exam  Constitutional: She appears well-developed. No distress.  HENT:  Head: Normocephalic.  Right Ear: External ear normal.  Left Ear: External ear normal.  Nose: Nose normal.  Mouth/Throat: Oropharynx is clear and moist.  Eyes: Conjunctivae are normal. Pupils are equal, round, and reactive to light. Right eye exhibits no discharge. Left eye exhibits no discharge.  Neck: Normal range of motion. Neck supple. No JVD present. No tracheal deviation present. No thyromegaly present.  Cardiovascular: Normal rate, regular rhythm and normal heart sounds.   Pulmonary/Chest: No stridor. No respiratory distress. She has no wheezes.  Abdominal: Soft. Bowel sounds are normal. She exhibits no distension and no mass. There is no tenderness. There is no rebound and no guarding.  Musculoskeletal: She exhibits tenderness. She exhibits no edema.  Lymphadenopathy:    She has no cervical adenopathy.  Neurological: She displays normal reflexes. No cranial nerve deficit. She exhibits normal muscle tone. Coordination normal.  Skin: No rash noted. No erythema.  Psychiatric: She has a normal mood and affect. Her behavior is normal. Judgment and thought content normal.  R foot is in a surgical shoe L knee 2 cm abrasion Lab Results  Component Value Date   WBC 11.7* 06/16/2015   HGB 16.0* 06/16/2015   HCT 47.7* 06/16/2015     PLT 315.0 06/16/2015   GLUCOSE 104* 06/16/2015   CHOL 218* 07/08/2014   TRIG * 07/08/2014    460.0 Triglyceride is over 400; calculations on Lipids are invalid.   HDL 30.90* 07/08/2014   LDLDIRECT 125.3 07/08/2014   LDLCALC 81 01/02/2013   ALT 11 01/28/2014   AST 17 01/28/2014   NA 138 06/16/2015   K 4.4 06/16/2015   CL 99 06/16/2015   CREATININE 0.77 06/16/2015   BUN 12 06/16/2015   CO2 32 06/16/2015   TSH 1.94 06/16/2015   INR 0.95 01/28/2014   HGBA1C 7.5 10/12/2015   MICROALBUR <0.7 03/12/2015    Ct Chest Wo Contrast  06/25/2015  CLINICAL DATA:  Abnormal chest x-ray with progressive shortness of breath. EXAM: CT CHEST WITHOUT CONTRAST TECHNIQUE: Multidetector  CT imaging of the chest was performed following the standard protocol without IV contrast. COMPARISON:  Chest x-ray dated 06/16/2015. FINDINGS: Heart size is normal. No pericardial effusion. Thoracic aorta is normal in caliber and configuration. Scattered small lymph nodes noted within the mediastinum. No mass or enlarged lymph nodes within the mediastinum or perihilar regions. Lungs are clear. No evidence of pneumonia. No pleural effusion. No pulmonary nodule or mass. No interstitial thickening. Trachea and central bronchi appear normal. Limited images of the upper abdomen are unremarkable. Mild degenerative change noted within the thoracic spine but no acute osseous abnormality. IMPRESSION: Essentially normal chest CT. No acute findings. Lungs are clear and mediastinum appears normal. The nodular opacity identified in the right suprahilar region on recent chest x-ray was likely due to superimposition of normal suprahilar vessels. Electronically Signed   By: Franki Cabot M.D.   On: 06/25/2015 14:39    Assessment & Plan:   Diagnoses and all orders for this visit:  Knee contusion, left, initial encounter  Cutaneous vasculitis  WEIGHT GAIN, ABNORMAL  Low back pain with sciatica, sciatica laterality unspecified,  unspecified back pain laterality, unspecified chronicity  Other orders -     Discontinue: oxyCODONE (ROXICODONE) 15 MG immediate release tablet; Take 1 tablet (15 mg total) by mouth every 6 (six) hours as needed for pain. Please fill on or after 01/13/16. -     Discontinue: oxyCODONE (ROXICODONE) 15 MG immediate release tablet; Take 1 tablet (15 mg total) by mouth every 6 (six) hours as needed for pain. Please fill on or after 02/13/16. -     oxyCODONE (ROXICODONE) 15 MG immediate release tablet; Take 1 tablet (15 mg total) by mouth every 6 (six) hours as needed for pain. Please fill on or after 03/14/16.  I have discontinued Ms. Linhart's oxyCODONE and oxyCODONE. I have also changed her oxyCODONE. Additionally, I am having her maintain her estradiol, vitamin B-12, cholecalciferol, BD PEN NEEDLE NANO U/F, EASY TOUCH LANCING DEVICE, PRESCRIPTION MEDICATION, ACCU-CHEK SOFTCLIX LANCETS, lidocaine, ACCU-CHEK AVIVA PLUS, Albuterol Sulfate, aspirin, ondansetron, silver sulfADIAZINE, insulin NPH Human, traMADol, baclofen, insulin regular, ZETIA, B-D INS SYRINGE 0.5CC/31GX5/16, B-D INS SYR ULTRAFINE 1CC/31G, clonazePAM, clotrimazole-betamethasone, fluconazole, zolpidem, gabapentin, lamoTRIgine, levothyroxine, and doxycycline.  Meds ordered this encounter  Medications  . doxycycline (VIBRA-TABS) 100 MG tablet    Sig: Take 1 tablet by mouth 2 (two) times daily.  Marland Kitchen DISCONTD: oxyCODONE (ROXICODONE) 15 MG immediate release tablet    Sig: Take 1 tablet (15 mg total) by mouth every 6 (six) hours as needed for pain. Please fill on or after 01/13/16.    Dispense:  120 tablet    Refill:  0  . DISCONTD: oxyCODONE (ROXICODONE) 15 MG immediate release tablet    Sig: Take 1 tablet (15 mg total) by mouth every 6 (six) hours as needed for pain. Please fill on or after 02/13/16.    Dispense:  120 tablet    Refill:  0  . oxyCODONE (ROXICODONE) 15 MG immediate release tablet    Sig: Take 1 tablet (15 mg total) by mouth every  6 (six) hours as needed for pain. Please fill on or after 03/14/16.    Dispense:  120 tablet    Refill:  0     Follow-up: No Follow-up on file.  Walker Kehr, MD

## 2016-01-13 NOTE — Progress Notes (Signed)
Pre visit review using our clinic review tool, if applicable. No additional management support is needed unless otherwise documented below in the visit note. 

## 2016-01-13 NOTE — Assessment & Plan Note (Signed)
Wt Readings from Last 3 Encounters:  01/13/16 245 lb (111.131 kg)  11/11/15 247 lb (112.038 kg)  10/12/15 247 lb (112.038 kg)

## 2016-01-13 NOTE — Assessment & Plan Note (Signed)
Doing better.   

## 2016-01-13 NOTE — Assessment & Plan Note (Signed)
On Oxycodone     Potential benefits of a long term opioids use as well as potential risks (i.e. addiction risk, apnea etc) and complications (i.e. Somnolence, constipation and others) were explained to the patient and were aknowledged. Risks of use w/benzodiazepines discussed

## 2016-01-15 ENCOUNTER — Encounter: Payer: Self-pay | Admitting: Endocrinology

## 2016-01-15 ENCOUNTER — Ambulatory Visit (INDEPENDENT_AMBULATORY_CARE_PROVIDER_SITE_OTHER): Payer: Medicare Other | Admitting: Endocrinology

## 2016-01-15 VITALS — BP 132/70 | HR 95 | Temp 97.7°F | Ht 69.5 in | Wt 257.0 lb

## 2016-01-15 DIAGNOSIS — E1140 Type 2 diabetes mellitus with diabetic neuropathy, unspecified: Secondary | ICD-10-CM | POA: Diagnosis not present

## 2016-01-15 LAB — POCT GLYCOSYLATED HEMOGLOBIN (HGB A1C): Hemoglobin A1C: 6.6

## 2016-01-15 NOTE — Progress Notes (Signed)
Subjective:    Patient ID: Katherine Herrera, female    DOB: 12-30-1965, 50 y.o.   MRN: [id number]  HPI Pt returns for f/u of diabetes mellitus: DM type: Insulin-requiring type 2 Dx'ed: 123456 Complications: polyneuropathy and foot ulcers.  Therapy: insulin since 2012.   GDM: never. DKA: never Severe hypoglycemia: last episode was in late 2013.   Pancreatitis: never.  Other: she takes multiple daily injections; she takes human insulin, for cost reasons.   Interval history: Her right foot continue to feel better.  She seldom has hypoglycemia, and these episodes are mild.  This happens when a meal is delayed.   Past Medical History  Diagnosis Date  . Syncope     poss conversion disorder vs psychogenic seizures  . DM (diabetes mellitus) ([place])   . Hypothyroidism   . LBP (low back pain)     DR Patrice Paradise  . Peripheral neuropathy ([place])   . COPD (chronic obstructive pulmonary disease) ([place])   . Vitamin D deficiency   . Migraine   . Hyperlipidemia   . Anxiety   . Gout     possible  . Tachycardia     diet coke overuse?  . TTS (tarsal tunnel syndrome) 2010    Left Foot [place] in [place]  . Tenosynovitis of foot and ankle 09/03/2013    Past Surgical History  Procedure Laterality Date  . Cervical fusion      C4-5  . Abdominal hysterectomy    . Carpal tunnel release  2010    LEFT    Social History   Social History  . Marital Status: Married    Spouse Name: N/A  . Number of Children: N/A  . Years of Education: N/A   Occupational History  . Not on file.   Social History Main Topics  . Smoking status: Former Smoker    Quit date: 12/01/2009  . Smokeless tobacco: Never Used  . Alcohol Use: No  . Drug Use: No  . Sexual Activity: Yes   Other Topics Concern  . Not on file   Social History Narrative   GYN - Dr Radene Knee      FAMILY HISTORY   Lung cancer   Mother w/alcoholism      Married 3d GS due Nov   Diet Coke: 10 cans a day      Regular exercise - NO      Former smoker  2009 restared 2010 stopped 12/2009    Current Outpatient Prescriptions on File Prior to Visit  Medication Sig Dispense Refill  . ACCU-CHEK AVIVA PLUS test strip 3 (three) times a week.    Marland Kitchen ACCU-CHEK SOFTCLIX LANCETS lancets TEST TWO TIMES DAILY 200 each 3  . Albuterol Sulfate (PROAIR RESPICLICK) 123XX123 (90 BASE) MCG/ACT AEPB Inhale 1-2 puffs into the lungs 4 (four) times daily as needed. 1 each 5  . aspirin (BAYER ASPIRIN) 325 MG tablet Take 1 tablet (325 mg total) by mouth daily. 100 tablet 3  . B-D INS SYR ULTRAFINE 1CC/31G 31G X 5/16" 1 ML MISC USE AS DIRECTED 100 each 0  . B-D INS SYRINGE 0.5CC/31GX5/16 31G X 5/16" 0.5 ML MISC USE AS DIRECTED WITH INSULIN 4 TIMES A DAY 100 each 0  . baclofen (LIORESAL) 10 MG tablet Take 2 tablets (20 mg total) by mouth 2 (two) times daily. 360 tablet 1  . BD PEN NEEDLE NANO U/F 32G X 4 MM MISC     . Cholecalciferol (VITAMIN D3) 1000 UNIT tablet Take 2,000 Units  by mouth daily.      . clonazePAM (KLONOPIN) 0.5 MG tablet TAKE ONE TABLET BY MOUTH 2 TIMES A DAY AS NEEDED FOR ANXIETY 60 tablet 2  . clotrimazole-betamethasone (LOTRISONE) cream Apply 1 application topically 2 (two) times daily. 45 g 1  . doxycycline (VIBRA-TABS) 100 MG tablet Take 1 tablet by mouth 2 (two) times daily.    Marland Kitchen estradiol (ESTRACE) 1 MG tablet Take 1 mg by mouth daily.      . fluconazole (DIFLUCAN) 150 MG tablet Take 1 tablet (150 mg total) by mouth once. 3 tablet 1  . gabapentin (NEURONTIN) 800 MG tablet TAKE 1 TABLET FOUR TIMES DAILY 360 tablet 2  . insulin NPH Human (HUMULIN N) 100 UNIT/ML injection Inject 0.35 mLs (35 Units total) into the skin at bedtime. 20 mL 11  . insulin regular (HUMULIN R) 100 units/mL injection INJECT 3 TIMES A DAY BEFORE EACH MEAL. (70-15-60 UNITS) 50 mL 11  . lamoTRIgine (LAMICTAL) 25 MG tablet Take 1 tablet (25 mg total) by mouth 2 (two) times daily. 180 tablet 1  . Lancet Devices (EASY TOUCH LANCING DEVICE) MISC     . levothyroxine (SYNTHROID, LEVOTHROID)  75 MCG tablet Take 1 tablet (75 mcg total) by mouth daily. 90 tablet 1  . lidocaine (XYLOCAINE) 5 % ointment Apply 1 application topically as needed. 35.44 g 0  . ondansetron (ZOFRAN) 4 MG tablet Take 1 tablet (4 mg total) by mouth every 8 (eight) hours as needed for nausea or vomiting. 20 tablet 0  . oxyCODONE (ROXICODONE) 15 MG immediate release tablet Take 1 tablet (15 mg total) by mouth every 6 (six) hours as needed for pain. Please fill on or after 03/14/16. 120 tablet 0  . PRESCRIPTION MEDICATION Apply 1-2 g topically See admin instructions. Apply 1-2 grams (1-2 ML) to affected area 2 to 3 times a day Diclofenac/Baclofen/Bupivacane    . silver sulfADIAZINE (SILVADENE) 1 % cream Apply 1 application topically 2 (two) times daily. On the leg wound 25 g 0  . traMADol (ULTRAM) 50 MG tablet TAKE 1 TO 2 TABLETS TWICE DAILY AS NEEDED FOR HEADACHE 60 tablet 0  . vitamin B-12 (CYANOCOBALAMIN) 1000 MCG tablet Take 1,000 mcg by mouth daily.      Marland Kitchen ZETIA 10 MG tablet TAKE ONE TABLET BY MOUTH EVERY DAY 30 tablet 11  . zolpidem (AMBIEN) 10 MG tablet Take 0.5 tablets (5 mg total) by mouth at bedtime. 30 tablet 2   No current facility-administered medications on file prior to visit.    Allergies  Allergen Reactions  . Crestor [Rosuvastatin Calcium]     myalgia  . Lovastatin Other (See Comments)    Unknown   . Metformin Nausea And Vomiting  . Niacin Other (See Comments)    Unknown   . Paroxetine Other (See Comments)    Unknown   . Meperidine Hcl Rash    Family History  Problem Relation Age of Onset  . Diabetes Mother   . Hypertension Mother   . Cancer Father 58    ? bone cancer  . [name] Brother     BP 132/70 mmHg  Pulse 95  Temp(Src) 97.7 F (36.5 C) (Oral)  Ht 5' 9.5" (1.765 m)  Wt 257 lb (116.574 kg)  BMI 37.42 kg/m2  SpO2 99%   Review of Systems She denies LOC    Objective:   Physical Exam VITAL SIGNS: See vs page GENERAL: no distress Pulses: left dorsalis  pedis intact.  MSK: no deformity  of the left foot CV: trace bilat leg edema Skin: Multiple healed ulcers at the left leg and foot. normal color and temp on the left foot. Neuro: sensation is intact to touch on the left foot, but decreased from normal. Ext: Right foot in a bandage   A1c=6.5%    Assessment & Plan:  DM: well-controlled.  Patient is advised the following: Patient Instructions  check your blood sugar twice a day.  vary the time of day when you check, between before the 3 meals, and at bedtime.  also check if you have symptoms of your blood sugar being too high or too low.  please keep a record of the readings and bring it to your next appointment here.  please call us sooner if your blood sugar goes below 70, or if it stays over 200.   Please continue the same insulins for now.     please come back for a follow-up appointment in 4 months.

## 2016-01-15 NOTE — Patient Instructions (Addendum)
check your blood sugar twice a day.  vary the time of day when you check, between before the 3 meals, and at bedtime.  also check if you have symptoms of your blood sugar being too high or too low.  please keep a record of the readings and bring it to your next appointment here.  please call us sooner if your blood sugar goes below 70, or if it stays over 200.   Please continue the same insulins for now.     please come back for a follow-up appointment in 4 months.

## 2016-01-27 DIAGNOSIS — Z9889 Other specified postprocedural states: Secondary | ICD-10-CM | POA: Diagnosis not present

## 2016-02-13 ENCOUNTER — Other Ambulatory Visit: Payer: Self-pay | Admitting: Endocrinology

## 2016-02-15 ENCOUNTER — Ambulatory Visit: Payer: Medicare PPO | Admitting: Sports Medicine

## 2016-02-17 ENCOUNTER — Ambulatory Visit: Payer: Medicare Other | Admitting: Podiatry

## 2016-02-17 DIAGNOSIS — Z794 Long term (current) use of insulin: Secondary | ICD-10-CM | POA: Diagnosis not present

## 2016-02-17 DIAGNOSIS — E1140 Type 2 diabetes mellitus with diabetic neuropathy, unspecified: Secondary | ICD-10-CM | POA: Diagnosis not present

## 2016-02-17 DIAGNOSIS — Z79899 Other long term (current) drug therapy: Secondary | ICD-10-CM | POA: Diagnosis not present

## 2016-02-17 DIAGNOSIS — J449 Chronic obstructive pulmonary disease, unspecified: Secondary | ICD-10-CM | POA: Diagnosis not present

## 2016-02-17 DIAGNOSIS — M62838 Other muscle spasm: Secondary | ICD-10-CM | POA: Diagnosis not present

## 2016-02-17 DIAGNOSIS — M542 Cervicalgia: Secondary | ICD-10-CM | POA: Diagnosis not present

## 2016-02-17 DIAGNOSIS — Z888 Allergy status to other drugs, medicaments and biological substances status: Secondary | ICD-10-CM | POA: Diagnosis not present

## 2016-02-17 DIAGNOSIS — Z883 Allergy status to other anti-infective agents status: Secondary | ICD-10-CM | POA: Diagnosis not present

## 2016-02-17 DIAGNOSIS — Z79890 Hormone replacement therapy (postmenopausal): Secondary | ICD-10-CM | POA: Diagnosis not present

## 2016-02-17 DIAGNOSIS — Z9889 Other specified postprocedural states: Secondary | ICD-10-CM | POA: Diagnosis not present

## 2016-02-17 DIAGNOSIS — Z885 Allergy status to narcotic agent status: Secondary | ICD-10-CM | POA: Diagnosis not present

## 2016-02-17 DIAGNOSIS — E7800 Pure hypercholesterolemia, unspecified: Secondary | ICD-10-CM | POA: Diagnosis not present

## 2016-02-17 DIAGNOSIS — E039 Hypothyroidism, unspecified: Secondary | ICD-10-CM | POA: Diagnosis not present

## 2016-02-17 DIAGNOSIS — M79671 Pain in right foot: Secondary | ICD-10-CM | POA: Diagnosis not present

## 2016-03-02 ENCOUNTER — Ambulatory Visit: Payer: Medicare Other | Admitting: Podiatry

## 2016-03-08 DIAGNOSIS — S92351A Displaced fracture of fifth metatarsal bone, right foot, initial encounter for closed fracture: Secondary | ICD-10-CM | POA: Diagnosis not present

## 2016-03-08 DIAGNOSIS — R600 Localized edema: Secondary | ICD-10-CM | POA: Diagnosis not present

## 2016-03-10 ENCOUNTER — Ambulatory Visit: Payer: Medicare Other | Admitting: Podiatry

## 2016-04-04 ENCOUNTER — Ambulatory Visit (INDEPENDENT_AMBULATORY_CARE_PROVIDER_SITE_OTHER): Payer: Medicare Other | Admitting: Podiatry

## 2016-04-04 ENCOUNTER — Encounter: Payer: Self-pay | Admitting: Podiatry

## 2016-04-04 VITALS — BP 118/78 | HR 74 | Resp 16

## 2016-04-04 DIAGNOSIS — Q828 Other specified congenital malformations of skin: Secondary | ICD-10-CM | POA: Diagnosis not present

## 2016-04-04 DIAGNOSIS — B351 Tinea unguium: Secondary | ICD-10-CM | POA: Diagnosis not present

## 2016-04-04 DIAGNOSIS — M79606 Pain in leg, unspecified: Secondary | ICD-10-CM | POA: Diagnosis not present

## 2016-04-04 DIAGNOSIS — E0842 Diabetes mellitus due to underlying condition with diabetic polyneuropathy: Secondary | ICD-10-CM

## 2016-04-04 NOTE — Progress Notes (Signed)
   Subjective:    Patient ID: Katherine Herrera, female    DOB: 10/10/1966, 50 y.o.   MRN: [id number]  HPI    Review of Systems  All other systems reviewed and are negative.      Objective:   Physical Exam        Assessment & Plan:

## 2016-04-06 NOTE — Progress Notes (Signed)
Subjective:     Patient ID: Katherine Herrera, female   DOB: 08-24-1966, 50 y.o.   MRN: [id number]  HPI patient presents stating that she is a diabetic for a long time in poor health and has nail disease that become thick and make it hard for her to wear shoe gear comfortably. Feels like the right big toenail is ingrown and she scared to get it out due to her long-term diabetes   Review of Systems  All other systems reviewed and are negative.      Objective:   Physical Exam  Constitutional: She is oriented to person, place, and time.  Musculoskeletal: Normal range of motion.  Neurological: She is oriented to person, place, and time.  Skin: Skin is warm and dry.  Nursing note and vitals reviewed.  vascular status found to be diminished with diminishment of DP and PT pulses bilateral. I noted there to be relatively dry skin formation in her plantar feet and there is diminishment of sharp dull and vibratory at the current time. Patient has moderate depression of the arch bilateral and areas of irritation on the fifth metatarsals were she walks on this but there is no current ulceration around this area     Assessment:     Long-term diabetic at risk factors of neurological vascular disease with nail disease 1-5 both feet that are painful and lesion formation bilateral that is keratotic with some porokeratotic characteristic and is painful    Plan:     H&P and condition reviewed and today I debrided the lesions on the plantar fifth metatarsal bilateral and I debrided nailbeds 1-5 both feet with no iatrogenic bleeding noted. I then discussed long-term diabetic shoes due to her risk factors and she will be approved to have a pair diabetic shoes made

## 2016-04-08 ENCOUNTER — Encounter: Payer: Self-pay | Admitting: Internal Medicine

## 2016-04-08 ENCOUNTER — Ambulatory Visit (INDEPENDENT_AMBULATORY_CARE_PROVIDER_SITE_OTHER): Payer: Medicare Other | Admitting: Internal Medicine

## 2016-04-08 VITALS — BP 108/66 | HR 96 | Wt 255.0 lb

## 2016-04-08 DIAGNOSIS — E1140 Type 2 diabetes mellitus with diabetic neuropathy, unspecified: Secondary | ICD-10-CM

## 2016-04-08 DIAGNOSIS — E538 Deficiency of other specified B group vitamins: Secondary | ICD-10-CM

## 2016-04-08 DIAGNOSIS — M5440 Lumbago with sciatica, unspecified side: Secondary | ICD-10-CM

## 2016-04-08 MED ORDER — OXYCODONE HCL 15 MG PO TABS: 15.0000 mg | ORAL_TABLET | Freq: Four times a day (QID) | ORAL | Status: DC | PRN

## 2016-04-08 NOTE — Progress Notes (Signed)
Subjective:  Patient ID: Katherine Herrera, female    DOB: [birth year]  Age: 50 y.o. MRN: [id number]  CC: No chief complaint on file.   HPI Katherine Herrera presents for DM, HTN, chronic pain f/u  Outpatient Prescriptions Prior to Visit  Medication Sig Dispense Refill  . ACCU-CHEK AVIVA PLUS test strip 3 (three) times a week.    Marland Kitchen ACCU-CHEK SOFTCLIX LANCETS lancets TEST TWO TIMES DAILY 200 each 3  . Albuterol Sulfate (PROAIR RESPICLICK) 123XX123 (90 BASE) MCG/ACT AEPB Inhale 1-2 puffs into the lungs 4 (four) times daily as needed. 1 each 5  . aspirin (BAYER ASPIRIN) 325 MG tablet Take 1 tablet (325 mg total) by mouth daily. 100 tablet 3  . B-D INS SYR ULTRAFINE 1CC/31G 31G X 5/16" 1 ML MISC USE AS DIRECTED 100 each 0  . B-D INS SYRINGE 0.5CC/31GX5/16 31G X 5/16" 0.5 ML MISC USE AS DIRECTED WITH INSULIN 4 TIMES A DAY 100 each 0  . baclofen (LIORESAL) 10 MG tablet Take 2 tablets (20 mg total) by mouth 2 (two) times daily. 360 tablet 1  . BD PEN NEEDLE NANO U/F 32G X 4 MM MISC     . Cholecalciferol (VITAMIN D3) 1000 UNIT tablet Take 2,000 Units by mouth daily.      . clonazePAM (KLONOPIN) 0.5 MG tablet TAKE ONE TABLET BY MOUTH 2 TIMES A DAY AS NEEDED FOR ANXIETY 60 tablet 2  . clotrimazole-betamethasone (LOTRISONE) cream Apply 1 application topically 2 (two) times daily. 45 g 1  . estradiol (ESTRACE) 1 MG tablet Take 1 mg by mouth daily.      . fluconazole (DIFLUCAN) 150 MG tablet Take 1 tablet (150 mg total) by mouth once. 3 tablet 1  . gabapentin (NEURONTIN) 800 MG tablet TAKE 1 TABLET FOUR TIMES DAILY 360 tablet 2  . insulin NPH Human (HUMULIN N) 100 UNIT/ML injection Inject 0.35 mLs (35 Units total) into the skin at bedtime. 20 mL 11  . insulin regular (HUMULIN R) 100 units/mL injection INJECT 3 TIMES A DAY BEFORE EACH MEAL. (70-15-60 UNITS) 50 mL 11  . lamoTRIgine (LAMICTAL) 25 MG tablet Take 1 tablet (25 mg total) by mouth 2 (two) times daily. 180 tablet 1  . Lancet Devices (EASY TOUCH  LANCING DEVICE) MISC     . levothyroxine (SYNTHROID, LEVOTHROID) 75 MCG tablet Take 1 tablet (75 mcg total) by mouth daily. 90 tablet 1  . lidocaine (XYLOCAINE) 5 % ointment Apply 1 application topically as needed. 35.44 g 0  . ondansetron (ZOFRAN) 4 MG tablet Take 1 tablet (4 mg total) by mouth every 8 (eight) hours as needed for nausea or vomiting. 20 tablet 0  . oxyCODONE (ROXICODONE) 15 MG immediate release tablet Take 1 tablet (15 mg total) by mouth every 6 (six) hours as needed for pain. Please fill on or after 03/14/16. 120 tablet 0  . PRESCRIPTION MEDICATION Apply 1-2 g topically See admin instructions. Apply 1-2 grams (1-2 ML) to affected area 2 to 3 times a day Diclofenac/Baclofen/Bupivacane    . silver sulfADIAZINE (SILVADENE) 1 % cream Apply 1 application topically 2 (two) times daily. On the leg wound 25 g 0  . traMADol (ULTRAM) 50 MG tablet TAKE 1 TO 2 TABLETS TWICE DAILY AS NEEDED FOR HEADACHE 60 tablet 0  . vitamin B-12 (CYANOCOBALAMIN) 1000 MCG tablet Take 1,000 mcg by mouth daily.      Marland Kitchen ZETIA 10 MG tablet TAKE ONE TABLET BY MOUTH EVERY DAY 30 tablet 11  .  zolpidem (AMBIEN) 10 MG tablet Take 0.5 tablets (5 mg total) by mouth at bedtime. 30 tablet 2   No facility-administered medications prior to visit.    ROS Review of Systems  Constitutional: Negative for chills, activity change, appetite change, fatigue and unexpected weight change.  HENT: Negative for congestion, mouth sores and sinus pressure.   Eyes: Negative for visual disturbance.  Respiratory: Negative for cough and chest tightness.   Gastrointestinal: Negative for nausea and abdominal pain.  Genitourinary: Negative for frequency, difficulty urinating and vaginal pain.  Musculoskeletal: Positive for back pain, arthralgias and gait problem.  Skin: Negative for pallor, rash and wound.  Neurological: Negative for dizziness, tremors, weakness, numbness and headaches.  Psychiatric/Behavioral: Negative for suicidal  ideas, confusion and sleep disturbance.    Objective:  BP 108/66 mmHg  Pulse 96  Wt 255 lb (115.667 kg)  SpO2 93%  BP Readings from Last 3 Encounters:  04/08/16 108/66  04/04/16 118/78  01/15/16 132/70    Wt Readings from Last 3 Encounters:  04/08/16 255 lb (115.667 kg)  01/15/16 257 lb (116.574 kg)  01/13/16 245 lb (111.131 kg)    Physical Exam  Constitutional: She appears well-developed. No distress.  HENT:  Head: Normocephalic.  Right Ear: External ear normal.  Left Ear: External ear normal.  Nose: Nose normal.  Mouth/Throat: Oropharynx is clear and moist.  Eyes: Conjunctivae are normal. Pupils are equal, round, and reactive to light. Right eye exhibits no discharge. Left eye exhibits no discharge.  Neck: Normal range of motion. Neck supple. No JVD present. No tracheal deviation present. No thyromegaly present.  Cardiovascular: Normal rate, regular rhythm and normal heart sounds.   Pulmonary/Chest: No stridor. No respiratory distress. She has no wheezes.  Abdominal: Soft. Bowel sounds are normal. She exhibits no distension and no mass. There is no tenderness. There is no rebound and no guarding.  Musculoskeletal: She exhibits edema and tenderness.  Lymphadenopathy:    She has no cervical adenopathy.  Neurological: She displays normal reflexes. No cranial nerve deficit. She exhibits normal muscle tone. Coordination normal.  Skin: No rash noted. No erythema.  Psychiatric: She has a normal mood and affect. Her behavior is normal. Judgment and thought content normal.  trace edema L ankle Feet and LS spine are tender  Lab Results  Component Value Date   WBC 11.7* 06/16/2015   HGB 16.0* 06/16/2015   HCT 47.7* 06/16/2015   PLT 315.0 06/16/2015   GLUCOSE 104* 06/16/2015   CHOL 218* 07/08/2014   TRIG * 07/08/2014    460.0 Triglyceride is over 400; calculations on Lipids are invalid.   HDL 30.90* 07/08/2014   LDLDIRECT 125.3 07/08/2014   LDLCALC 81 01/02/2013   ALT 11  01/28/2014   AST 17 01/28/2014   NA 138 06/16/2015   K 4.4 06/16/2015   CL 99 06/16/2015   CREATININE 0.77 06/16/2015   BUN 12 06/16/2015   CO2 32 06/16/2015   TSH 1.94 06/16/2015   INR 0.95 01/28/2014   HGBA1C 6.6 01/15/2016   MICROALBUR <0.7 03/12/2015    Ct Chest Wo Contrast  06/25/2015  CLINICAL DATA:  Abnormal chest x-ray with progressive shortness of breath. EXAM: CT CHEST WITHOUT CONTRAST TECHNIQUE: Multidetector CT imaging of the chest was performed following the standard protocol without IV contrast. COMPARISON:  Chest x-ray dated 06/16/2015. FINDINGS: Heart size is normal. No pericardial effusion. Thoracic aorta is normal in caliber and configuration. Scattered small lymph nodes noted within the mediastinum. No mass or enlarged lymph nodes  within the mediastinum or perihilar regions. Lungs are clear. No evidence of pneumonia. No pleural effusion. No pulmonary nodule or mass. No interstitial thickening. Trachea and central bronchi appear normal. Limited images of the upper abdomen are unremarkable. Mild degenerative change noted within the thoracic spine but no acute osseous abnormality. IMPRESSION: Essentially normal chest CT. No acute findings. Lungs are clear and mediastinum appears normal. The nodular opacity identified in the right suprahilar region on recent chest x-ray was likely due to superimposition of normal suprahilar vessels. Electronically Signed   By: Franki Cabot M.D.   On: 06/25/2015 14:39    Assessment & Plan:   There are no diagnoses linked to this encounter. I am having Katherine Herrera maintain her estradiol, vitamin B-12, cholecalciferol, BD PEN NEEDLE NANO U/F, EASY TOUCH LANCING DEVICE, PRESCRIPTION MEDICATION, ACCU-CHEK SOFTCLIX LANCETS, lidocaine, ACCU-CHEK AVIVA PLUS, Albuterol Sulfate, aspirin, ondansetron, silver sulfADIAZINE, insulin NPH Human, traMADol, baclofen, insulin regular, ZETIA, clonazePAM, clotrimazole-betamethasone, fluconazole, zolpidem, gabapentin,  lamoTRIgine, levothyroxine, oxyCODONE, B-D INS SYR ULTRAFINE 1CC/31G, and B-D INS SYRINGE 0.5CC/31GX5/16.  No orders of the defined types were placed in this encounter.     Follow-up: No Follow-up on file.  Walker Kehr, MD

## 2016-04-08 NOTE — Assessment & Plan Note (Signed)
Humulin R and N 

## 2016-04-08 NOTE — Assessment & Plan Note (Signed)
On B12 

## 2016-04-08 NOTE — Progress Notes (Signed)
Pre visit review using our clinic review tool, if applicable. No additional management support is needed unless otherwise documented below in the visit note. 

## 2016-04-08 NOTE — Assessment & Plan Note (Signed)
Chronic OA On Oxycodone  Potential benefits of a long term opioids use as well as potential risks (i.e. addiction risk, apnea etc) and complications (i.e. Somnolence, constipation and others) were explained to the patient and were aknowledged. Risks of use w/benzodiazepines discussed

## 2016-04-20 ENCOUNTER — Other Ambulatory Visit: Payer: Self-pay | Admitting: Endocrinology

## 2016-04-22 ENCOUNTER — Other Ambulatory Visit: Payer: Self-pay | Admitting: Endocrinology

## 2016-05-06 ENCOUNTER — Telehealth: Payer: Self-pay | Admitting: Podiatry

## 2016-05-06 NOTE — Telephone Encounter (Signed)
Pt called to check to see if we got auth from pcp for pt to get diabetic shoes. Please call pt when we get auth

## 2016-05-18 ENCOUNTER — Ambulatory Visit: Payer: Medicare Other | Admitting: Endocrinology

## 2016-06-01 ENCOUNTER — Ambulatory Visit (INDEPENDENT_AMBULATORY_CARE_PROVIDER_SITE_OTHER): Payer: Medicare Other | Admitting: Podiatry

## 2016-06-01 ENCOUNTER — Ambulatory Visit (INDEPENDENT_AMBULATORY_CARE_PROVIDER_SITE_OTHER): Payer: Medicare Other

## 2016-06-01 ENCOUNTER — Encounter: Payer: Self-pay | Admitting: Podiatry

## 2016-06-01 VITALS — BP 109/67 | HR 74 | Resp 16

## 2016-06-01 DIAGNOSIS — M21961 Unspecified acquired deformity of right lower leg: Secondary | ICD-10-CM

## 2016-06-01 DIAGNOSIS — M79671 Pain in right foot: Secondary | ICD-10-CM | POA: Diagnosis not present

## 2016-06-01 DIAGNOSIS — M779 Enthesopathy, unspecified: Secondary | ICD-10-CM

## 2016-06-01 DIAGNOSIS — Q828 Other specified congenital malformations of skin: Secondary | ICD-10-CM | POA: Diagnosis not present

## 2016-06-01 DIAGNOSIS — E1140 Type 2 diabetes mellitus with diabetic neuropathy, unspecified: Secondary | ICD-10-CM | POA: Diagnosis not present

## 2016-06-01 MED ORDER — DOXYCYCLINE HYCLATE 100 MG PO TABS: 100.0000 mg | ORAL_TABLET | Freq: Two times a day (BID) | ORAL | 0 refills | Status: DC

## 2016-06-01 NOTE — Progress Notes (Signed)
Subjective:     Patient ID: Katherine Herrera, female   DOB: [birth year], 50 y.o.   MRN: [id number]  HPI patient presents with inflammation and pain around the fifth metatarsal base with fluid buildup noted and inability to wear shoe gear comfortably. Patient does not remember specific injury but does have long-term diabetes and has severe lesions underneath the fifth metatarsals both feet and history of neurological disease   Review of Systems     Objective:   Physical Exam Vascular and neurological status which has not changed significantly with severe keratotic lesion sub-5 right and left with redness around the base of the fifth metatarsal right and history of having a plate put on this after having had a fracture the fifth metatarsal. Does have diabetic risk with diminished neurological and vascular status    Assessment:     Lesion formation with redness which could be infection or inflammation even though there is no proximal signs of infection and no systemic signs of infection and chronic porokeratotic lesion sub-5 bilateral    Plan:     At risk diabetic with significant lesion formation and inflammatory or infective process, reviewed condition and at this point debridement of significant lesions with neurological elements created fifth metatarsal both feet and I went ahead as precautionary measure placed patient on doxycycline 100 mg twice a day and gave instructions that it's possible that we may need to treat it as an inflammatory condition at next visit. Reappoint to recheck and for routine care nailbeds and also checked on diabetic shoes which hopefully we will have for her second

## 2016-06-08 ENCOUNTER — Telehealth: Payer: Self-pay

## 2016-06-08 NOTE — Telephone Encounter (Signed)
Reduce all insulin doses by 10 units

## 2016-06-08 NOTE — Telephone Encounter (Signed)
Pt's husband called and stated early this morning at 5 am the pt's blood sugar dropped to 30. During this time the pt was unconscious. Pt's husband treated the pt initially by giving a tablespoon of pure sugar and letting it dissolve in the pt's mouth. After doing this the pt regained consciousness and was able to eat a piece of toast and drink some OJ. After eating the pt's blood sugar was 85. Fasting this morning at 7 am the blood sugar was up to 185. Pt is currently taking 70 units a breakfast, 15 units at lunch and 60 units at supper of Humulin R. Pt is also taking 35 units of humulin N at bedtime. Could you please review and please advise on how to proceed during Dr. Cordelia Pen absence? Thanks!

## 2016-06-08 NOTE — Telephone Encounter (Signed)
I contacted the pt's husband and advised of note below. Pt's husband verbalized understanding and stated he would call back if her blood sugar does not normalize. He voiced understanding.

## 2016-06-17 ENCOUNTER — Ambulatory Visit: Payer: Medicare Other | Admitting: Podiatry

## 2016-06-20 ENCOUNTER — Ambulatory Visit: Payer: Medicare Other | Admitting: Endocrinology

## 2016-06-20 ENCOUNTER — Encounter: Payer: Self-pay | Admitting: Endocrinology

## 2016-06-20 ENCOUNTER — Ambulatory Visit (INDEPENDENT_AMBULATORY_CARE_PROVIDER_SITE_OTHER): Payer: Medicare Other | Admitting: Endocrinology

## 2016-06-20 DIAGNOSIS — E1140 Type 2 diabetes mellitus with diabetic neuropathy, unspecified: Secondary | ICD-10-CM

## 2016-06-20 MED ORDER — INSULIN REGULAR HUMAN 100 UNIT/ML IJ SOLN
INTRAMUSCULAR | 11 refills | Status: DC
Start: 2016-06-20 — End: 2016-09-07

## 2016-06-20 NOTE — Patient Instructions (Addendum)
check your blood sugar twice a day.  vary the time of day when you check, between before the 3 meals, and at bedtime.  also check if you have symptoms of your blood sugar being too high or too low.  please keep a record of the readings and bring it to your next appointment here.  please call us sooner if your blood sugar goes below 70, or if it stays over 200.   Please stop taking the NPH, and reduce the regular to the amounts noted below.    please come back for a follow-up appointment in 2-3 months.

## 2016-06-20 NOTE — Progress Notes (Signed)
   Subjective:    Patient ID: Katherine Herrera, female    DOB: 1966/09/18, 50 y.o.   MRN: [id number]  HPI Pt returns for f/u of diabetes mellitus: DM type: Insulin-requiring type 2 Dx'ed: 123456 Complications: polyneuropathy and foot ulcers.  Therapy: insulin since 2012.   GDM: never. DKA: never Severe hypoglycemia: last episode was in late 2013.   Pancreatitis: never.   Other: she takes multiple daily injections; she takes human insulin, for cost reasons.   Interval history: she has mild hypoglycemia 1-2 times per week.  This can happen at any time of day.  Because of this. She skips approx 1 dose of regular per day.  She usually skips the NPH.  she brings a record of her cbg's which i have reviewed today.  It varies from 43-301.  She called our office, and reg was reduced to 3 times a day (just before each meal) 60-5-50 units.  Since the reduction, she continues to have mild hypoglycemia approx twice a day.   Review of Systems Denies LOC.    Objective:   Physical Exam  VITAL SIGNS:  See vs page GENERAL: no distress Pulses: dorsalis pedis intact bilat.   MSK: no deformity of the feet CV: trace bilat leg edema Skin: there is a healed ulcer at the lateral aspect of the right foot.  normal color and temp on the feet.  Neuro: sensation is intact to touch on the feet, but decreased from normal.   A1c=6.7%    Assessment & Plan:  Insulin-requiring type 2 DM: overcontrolled

## 2016-06-22 ENCOUNTER — Telehealth: Payer: Self-pay | Admitting: Endocrinology

## 2016-06-22 MED ORDER — ACCU-CHEK SOFTCLIX LANCETS MISC: 3 refills | Status: DC

## 2016-06-22 MED ORDER — ACCU-CHEK AVIVA PLUS VI STRP: ORAL_STRIP | 2 refills | Status: DC

## 2016-06-22 NOTE — Telephone Encounter (Signed)
Patient need a prescription for diabetic testing supply's.  Katherine Herrera, Katherine Herrera [employer] [phone] (Phone) [phone] (Fax)   [facility] is faxing over a request.

## 2016-06-22 NOTE — Telephone Encounter (Signed)
Rx submitted

## 2016-06-24 ENCOUNTER — Other Ambulatory Visit: Payer: Self-pay

## 2016-06-24 MED ORDER — ONETOUCH DELICA LANCETS 33G MISC: 2 refills | Status: DC

## 2016-06-24 MED ORDER — GLUCOSE BLOOD VI STRP: ORAL_STRIP | 12 refills | Status: DC

## 2016-06-30 NOTE — Telephone Encounter (Signed)
Maricela from [facility] need clarification  on Verify size and brand name.  Ref # [id number]

## 2016-07-01 ENCOUNTER — Other Ambulatory Visit: Payer: Self-pay

## 2016-07-01 DIAGNOSIS — E1140 Type 2 diabetes mellitus with diabetic neuropathy, unspecified: Secondary | ICD-10-CM

## 2016-07-01 MED ORDER — "INSULIN SYRINGE-NEEDLE U-100 31G X 5/16"" 0.5 ML MISC": 2 refills | Status: DC

## 2016-07-01 MED ORDER — ACCU-CHEK SOFTCLIX LANCET DEV MISC: 0 refills | Status: DC

## 2016-07-01 MED ORDER — ACCU-CHEK SOFTCLIX LANCET DEV MISC: 12 refills | Status: AC

## 2016-07-01 NOTE — Telephone Encounter (Signed)
[facility] notified of brand and size of insulin needle.

## 2016-07-08 ENCOUNTER — Ambulatory Visit: Payer: Medicare Other | Admitting: Internal Medicine

## 2016-07-08 ENCOUNTER — Telehealth: Payer: Self-pay | Admitting: Internal Medicine

## 2016-07-08 NOTE — Telephone Encounter (Signed)
OK. Thx

## 2016-07-08 NOTE — Telephone Encounter (Signed)
Patient could not make appt today.  She had to move until next opening on the 19th.  Patient is requesting enough oxycodone to get her through until that date.  Patient states she will need refill for the 15th through the 19th.

## 2016-07-11 ENCOUNTER — Ambulatory Visit (INDEPENDENT_AMBULATORY_CARE_PROVIDER_SITE_OTHER): Payer: Medicare Other | Admitting: Podiatry

## 2016-07-11 DIAGNOSIS — L84 Corns and callosities: Secondary | ICD-10-CM | POA: Diagnosis not present

## 2016-07-11 DIAGNOSIS — E0844 Diabetes mellitus due to underlying condition with diabetic amyotrophy: Secondary | ICD-10-CM | POA: Diagnosis not present

## 2016-07-11 DIAGNOSIS — Q828 Other specified congenital malformations of skin: Secondary | ICD-10-CM

## 2016-07-11 DIAGNOSIS — L97519 Non-pressure chronic ulcer of other part of right foot with unspecified severity: Secondary | ICD-10-CM | POA: Diagnosis not present

## 2016-07-11 DIAGNOSIS — E13621 Other specified diabetes mellitus with foot ulcer: Secondary | ICD-10-CM

## 2016-07-11 DIAGNOSIS — M79676 Pain in unspecified toe(s): Secondary | ICD-10-CM

## 2016-07-11 DIAGNOSIS — M79606 Pain in leg, unspecified: Secondary | ICD-10-CM

## 2016-07-11 DIAGNOSIS — M14671 Charcot's joint, right ankle and foot: Secondary | ICD-10-CM | POA: Diagnosis not present

## 2016-07-11 DIAGNOSIS — E1140 Type 2 diabetes mellitus with diabetic neuropathy, unspecified: Secondary | ICD-10-CM | POA: Diagnosis not present

## 2016-07-11 DIAGNOSIS — B351 Tinea unguium: Secondary | ICD-10-CM | POA: Diagnosis not present

## 2016-07-11 MED ORDER — OXYCODONE HCL 15 MG PO TABS: 15.0000 mg | ORAL_TABLET | Freq: Four times a day (QID) | ORAL | 0 refills | Status: DC | PRN

## 2016-07-11 NOTE — Telephone Encounter (Signed)
Printed script, Md signed, notified pt rx ready for pick-up...Katherine Herrera

## 2016-07-11 NOTE — Patient Instructions (Signed)

## 2016-07-11 NOTE — Progress Notes (Signed)
Patient ID: Katherine Herrera, female   DOB: 02-12-1966, 50 y.o.   MRN: [id number]  Patient presents for diabetic shoe pick up, shoes are tried on for good fit.  Patient received 1 pair Apex [id number] [place] boat shoe bone in women's size 10 extra wide and 3 pairs custom molded diabetic inserts.  Verbal and written break in and wear instructions given.

## 2016-07-12 NOTE — Progress Notes (Signed)
Subjective:     Patient ID: Katherine Herrera, female   DOB: Mar 21, 1966, 50 y.o.   MRN: [id number]  HPI patient presents as poor health individual who has long-term history of diabetes extreme obesity foot deformity nail disease and at risk ulcerations   Review of Systems     Objective:   Physical Exam Neurovascular status unchanged from previous visit with patient noted to have lesion underneath the right foot chronic in nature with no proximal edema erythema drainage noted nail disease 1-5 both feet with thick yellow incurvated nailbeds and long-term neuropathic changes with diminishment of sharp Dole vibratory    Assessment:     At risk patient with history of ulceration who has nail disease with thickness and pain and lesion formation that she cannot take care of it along with at risk ulceration possibilities    Plan:     H&P on conditions reviewed and at this time debrided nailbeds 1-5 both feet debrided lesion with no active drainage noted applied padding and dispensed diabetic shoes to try to reduce pressure on her feet and she'll be seen back again for [place] to reevaluate in the next several months or earlier if needed for any

## 2016-07-15 DIAGNOSIS — M79671 Pain in right foot: Secondary | ICD-10-CM | POA: Diagnosis not present

## 2016-07-15 DIAGNOSIS — S92351S Displaced fracture of fifth metatarsal bone, right foot, sequela: Secondary | ICD-10-CM | POA: Diagnosis not present

## 2016-07-19 ENCOUNTER — Ambulatory Visit (INDEPENDENT_AMBULATORY_CARE_PROVIDER_SITE_OTHER): Payer: Medicare Other | Admitting: Internal Medicine

## 2016-07-19 ENCOUNTER — Other Ambulatory Visit (INDEPENDENT_AMBULATORY_CARE_PROVIDER_SITE_OTHER): Payer: Medicare Other

## 2016-07-19 ENCOUNTER — Encounter: Payer: Self-pay | Admitting: Internal Medicine

## 2016-07-19 DIAGNOSIS — E538 Deficiency of other specified B group vitamins: Secondary | ICD-10-CM | POA: Diagnosis not present

## 2016-07-19 DIAGNOSIS — M5440 Lumbago with sciatica, unspecified side: Secondary | ICD-10-CM

## 2016-07-19 DIAGNOSIS — Z23 Encounter for immunization: Secondary | ICD-10-CM | POA: Diagnosis not present

## 2016-07-19 DIAGNOSIS — M79604 Pain in right leg: Secondary | ICD-10-CM | POA: Diagnosis not present

## 2016-07-19 DIAGNOSIS — E1140 Type 2 diabetes mellitus with diabetic neuropathy, unspecified: Secondary | ICD-10-CM

## 2016-07-19 LAB — CBC WITH DIFFERENTIAL/PLATELET
Basophils Absolute: 0.1 10*3/uL (ref 0.0–0.1)
Basophils Relative: 0.6 % (ref 0.0–3.0)
Eosinophils Absolute: 0.2 10*3/uL (ref 0.0–0.7)
Eosinophils Relative: 1.9 % (ref 0.0–5.0)
HCT: 45 % (ref 36.0–46.0)
Hemoglobin: 15.4 g/dL — ABNORMAL HIGH (ref 12.0–15.0)
Lymphocytes Relative: 31.7 % (ref 12.0–46.0)
Lymphs Abs: 3.9 10*3/uL (ref 0.7–4.0)
MCHC: 34.1 g/dL (ref 30.0–36.0)
MCV: 83.5 fl (ref 78.0–100.0)
Monocytes Absolute: 0.6 10*3/uL (ref 0.1–1.0)
Monocytes Relative: 4.8 % (ref 3.0–12.0)
Neutro Abs: 7.4 10*3/uL (ref 1.4–7.7)
Neutrophils Relative %: 61 % (ref 43.0–77.0)
Platelets: 287 10*3/uL (ref 150.0–400.0)
RBC: 5.39 Mil/uL — ABNORMAL HIGH (ref 3.87–5.11)
RDW: 13.7 % (ref 11.5–15.5)
WBC: 12.2 10*3/uL — ABNORMAL HIGH (ref 4.0–10.5)

## 2016-07-19 LAB — BASIC METABOLIC PANEL
BUN: 12 mg/dL (ref 6–23)
CO2: 32 mEq/L (ref 19–32)
Calcium: 8.8 mg/dL (ref 8.4–10.5)
Chloride: 99 mEq/L (ref 96–112)
Creatinine, Ser: 0.89 mg/dL (ref 0.40–1.20)
GFR: 71.17 mL/min (ref >60.00)
Glucose, Bld: 239 mg/dL — ABNORMAL HIGH (ref 70–99)
Potassium: 4.6 mEq/L (ref 3.5–5.1)
Sodium: 135 mEq/L (ref 135–145)

## 2016-07-19 LAB — HEPATIC FUNCTION PANEL
ALT: 9 U/L (ref 0–35)
AST: 8 U/L (ref 0–37)
Albumin: 3.7 g/dL (ref 3.5–5.2)
Alkaline Phosphatase: 128 U/L — ABNORMAL HIGH (ref 39–117)
Bilirubin, Direct: 0 mg/dL (ref 0.0–0.3)
Total Bilirubin: 0.3 mg/dL (ref 0.2–1.2)
Total Protein: 7.5 g/dL (ref 6.0–8.3)

## 2016-07-19 LAB — VITAMIN B12: Vitamin B-12: 162 pg/mL — ABNORMAL LOW (ref 211–911)

## 2016-07-19 LAB — LIPID PANEL
Cholesterol: 223 mg/dL — ABNORMAL HIGH (ref 0–200)
HDL: 27.4 mg/dL — ABNORMAL LOW (ref >39.00)
Total CHOL/HDL Ratio: 8
Triglycerides: 527 mg/dL — ABNORMAL HIGH (ref 0.0–149.0)

## 2016-07-19 LAB — MICROALBUMIN / CREATININE URINE RATIO
Creatinine,U: 246.3 mg/dL
Microalb Creat Ratio: 0.4 mg/g (ref 0.0–30.0)
Microalb, Ur: 0.9 mg/dL (ref 0.0–1.9)

## 2016-07-19 LAB — LDL CHOLESTEROL, DIRECT: Direct LDL: 106 mg/dL

## 2016-07-19 LAB — TSH: TSH: 1.16 u[IU]/mL (ref 0.35–4.50)

## 2016-07-19 LAB — HEMOGLOBIN A1C: Hgb A1c MFr Bld: 7.8 % — ABNORMAL HIGH (ref 4.6–6.5)

## 2016-07-19 MED ORDER — OXYCODONE HCL 15 MG PO TABS: 15.0000 mg | ORAL_TABLET | Freq: Four times a day (QID) | ORAL | 0 refills | Status: DC | PRN

## 2016-07-19 NOTE — Assessment & Plan Note (Signed)
On B12 

## 2016-07-19 NOTE — Progress Notes (Signed)
Subjective:  Patient ID: Katherine Herrera, female    DOB: 05/19/1966  Age: 50 y.o. MRN: [id number]  CC: No chief complaint on file.   HPI Katherine Herrera presents for DM, HTN, anxiety, insomnia f/u  Outpatient Medications Prior to Visit  Medication Sig Dispense Refill  . Albuterol Sulfate (PROAIR RESPICLICK) 123XX123 (90 BASE) MCG/ACT AEPB Inhale 1-2 puffs into the lungs 4 (four) times daily as needed. 1 each 5  . aspirin (BAYER ASPIRIN) 325 MG tablet Take 1 tablet (325 mg total) by mouth daily. 100 tablet 3  . B-D INS SYR ULTRAFINE 1CC/31G 31G X 5/16" 1 ML MISC USE AS DIRECTED 100 each 0  . baclofen (LIORESAL) 10 MG tablet Take 2 tablets (20 mg total) by mouth 2 (two) times daily. 360 tablet 1  . BD PEN NEEDLE NANO U/F 32G X 4 MM MISC     . Cholecalciferol (VITAMIN D3) 1000 UNIT tablet Take 2,000 Units by mouth daily.      . clonazePAM (KLONOPIN) 0.5 MG tablet TAKE ONE TABLET BY MOUTH 2 TIMES A DAY AS NEEDED FOR ANXIETY 60 tablet 2  . clotrimazole-betamethasone (LOTRISONE) cream Apply 1 application topically 2 (two) times daily. 45 g 1  . doxycycline (VIBRA-TABS) 100 MG tablet Take 1 tablet (100 mg total) by mouth 2 (two) times daily. 20 tablet 0  . estradiol (ESTRACE) 1 MG tablet Take 1 mg by mouth daily.      . fluconazole (DIFLUCAN) 150 MG tablet Take 1 tablet (150 mg total) by mouth once. 3 tablet 1  . gabapentin (NEURONTIN) 800 MG tablet TAKE 1 TABLET FOUR TIMES DAILY 360 tablet 2  . glucose blood (ONETOUCH VERIO) test strip Use to check blood sugar 2 times per day. 100 each 12  . insulin regular (HUMULIN R) 100 units/mL injection 50 units with breakfast, and 40 units with supper. 30 mL 11  . Insulin Syringe-Needle U-100 (B-D INS SYRINGE 0.5CC/31GX5/16) 31G X 5/16" 0.5 ML MISC USE AS DIRECTED WITH INSULIN 4 TIMES A DAY 400 each 2  . lamoTRIgine (LAMICTAL) 25 MG tablet Take 1 tablet (25 mg total) by mouth 2 (two) times daily. 180 tablet 1  . Lancet Devices (ACCU-CHEK SOFTCLIX) lancets  Test two times daily 100 each 12  . Lancet Devices (EASY TOUCH LANCING DEVICE) MISC     . levothyroxine (SYNTHROID, LEVOTHROID) 75 MCG tablet Take 1 tablet (75 mcg total) by mouth daily. 90 tablet 1  . lidocaine (XYLOCAINE) 5 % ointment Apply 1 application topically as needed. 35.44 g 0  . ondansetron (ZOFRAN) 4 MG tablet Take 1 tablet (4 mg total) by mouth every 8 (eight) hours as needed for nausea or vomiting. 20 tablet 0  . ONETOUCH DELICA LANCETS 99991111 MISC Use to check blood sugar 2 times per day. 200 each 2  . oxyCODONE (ROXICODONE) 15 MG immediate release tablet Take 1 tablet (15 mg total) by mouth every 6 (six) hours as needed for pain. Please fill on or after 07/15/16. 10 tablet 0  . PRESCRIPTION MEDICATION Apply 1-2 g topically See admin instructions. Apply 1-2 grams (1-2 ML) to affected area 2 to 3 times a day Diclofenac/Baclofen/Bupivacane    . silver sulfADIAZINE (SILVADENE) 1 % cream Apply 1 application topically 2 (two) times daily. On the leg wound 25 g 0  . traMADol (ULTRAM) 50 MG tablet TAKE 1 TO 2 TABLETS TWICE DAILY AS NEEDED FOR HEADACHE 60 tablet 0  . vitamin B-12 (CYANOCOBALAMIN) 1000 MCG tablet Take 1,000  mcg by mouth daily.      Marland Kitchen ZETIA 10 MG tablet TAKE ONE TABLET BY MOUTH EVERY DAY 30 tablet 11  . zolpidem (AMBIEN) 10 MG tablet Take 0.5 tablets (5 mg total) by mouth at bedtime. 30 tablet 2   No facility-administered medications prior to visit.     ROS Review of Systems  Constitutional: Positive for fatigue. Negative for activity change, appetite change, chills and unexpected weight change.  HENT: Negative for congestion, mouth sores and sinus pressure.   Eyes: Negative for visual disturbance.  Respiratory: Negative for cough and chest tightness.   Gastrointestinal: Negative for abdominal pain and nausea.  Genitourinary: Negative for difficulty urinating, frequency and vaginal pain.  Musculoskeletal: Positive for arthralgias and back pain. Negative for gait problem.    Skin: Negative for pallor and rash.  Neurological: Negative for dizziness, tremors, weakness, numbness and headaches.  Psychiatric/Behavioral: Positive for sleep disturbance. Negative for confusion. The patient is nervous/anxious.     Objective:  BP 102/60   Pulse 86   Temp 97.9 F (36.6 C) (Oral)   Wt 237 lb (107.5 kg)   SpO2 94%   BMI 34.01 kg/m   BP Readings from Last 3 Encounters:  07/19/16 102/60  06/20/16 118/70  06/01/16 109/67    Wt Readings from Last 3 Encounters:  07/19/16 237 lb (107.5 kg)  06/20/16 240 lb (108.9 kg)  04/08/16 255 lb (115.7 kg)    Physical Exam  Constitutional: She appears well-developed. No distress.  HENT:  Head: Normocephalic.  Right Ear: External ear normal.  Left Ear: External ear normal.  Nose: Nose normal.  Mouth/Throat: Oropharynx is clear and moist.  Eyes: Conjunctivae are normal. Pupils are equal, round, and reactive to light. Right eye exhibits no discharge. Left eye exhibits no discharge.  Neck: Normal range of motion. Neck supple. No JVD present. No tracheal deviation present. No thyromegaly present.  Cardiovascular: Normal rate, regular rhythm and normal heart sounds.   Pulmonary/Chest: No stridor. No respiratory distress. She has no wheezes.  Abdominal: Soft. Bowel sounds are normal. She exhibits no distension and no mass. There is no tenderness. There is no rebound and no guarding.  Musculoskeletal: She exhibits tenderness. She exhibits no edema.  Lymphadenopathy:    She has no cervical adenopathy.  Neurological: She displays normal reflexes. No cranial nerve deficit. She exhibits normal muscle tone. Coordination normal.  Skin: No rash noted. No erythema.  Psychiatric: She has a normal mood and affect. Her behavior is normal. Judgment and thought content normal.  Obese LS is tender  Lab Results  Component Value Date   WBC 11.7 (H) 06/16/2015   HGB 16.0 (H) 06/16/2015   HCT 47.7 (H) 06/16/2015   PLT 315.0 06/16/2015    GLUCOSE 104 (H) 06/16/2015   CHOL 218 (H) 07/08/2014   TRIG (H) 07/08/2014    460.0 Triglyceride is over 400; calculations on Lipids are invalid.   HDL 30.90 (L) 07/08/2014   LDLDIRECT 125.3 07/08/2014   LDLCALC 81 01/02/2013   ALT 11 01/28/2014   AST 17 01/28/2014   NA 138 06/16/2015   K 4.4 06/16/2015   CL 99 06/16/2015   CREATININE 0.77 06/16/2015   BUN 12 06/16/2015   CO2 32 06/16/2015   TSH 1.94 06/16/2015   INR 0.95 01/28/2014   HGBA1C 6.6 01/15/2016   MICROALBUR <0.7 03/12/2015    Ct Chest Wo Contrast  Result Date: 06/25/2015 CLINICAL DATA:  Abnormal chest x-ray with progressive shortness of breath. EXAM: CT CHEST  WITHOUT CONTRAST TECHNIQUE: Multidetector CT imaging of the chest was performed following the standard protocol without IV contrast. COMPARISON:  Chest x-ray dated 06/16/2015. FINDINGS: Heart size is normal. No pericardial effusion. Thoracic aorta is normal in caliber and configuration. Scattered small lymph nodes noted within the mediastinum. No mass or enlarged lymph nodes within the mediastinum or perihilar regions. Lungs are clear. No evidence of pneumonia. No pleural effusion. No pulmonary nodule or mass. No interstitial thickening. Trachea and central bronchi appear normal. Limited images of the upper abdomen are unremarkable. Mild degenerative change noted within the thoracic spine but no acute osseous abnormality. IMPRESSION: Essentially normal chest CT. No acute findings. Lungs are clear and mediastinum appears normal. The nodular opacity identified in the right suprahilar region on recent chest x-ray was likely due to superimposition of normal suprahilar vessels. Electronically Signed   By: Franki Cabot M.D.   On: 06/25/2015 14:39    Assessment & Plan:   There are no diagnoses linked to this encounter. I am having Ms. Gover maintain her estradiol, vitamin B-12, cholecalciferol, BD PEN NEEDLE NANO U/F, EASY TOUCH LANCING DEVICE, PRESCRIPTION MEDICATION,  lidocaine, Albuterol Sulfate, aspirin, ondansetron, silver sulfADIAZINE, traMADol, baclofen, ZETIA, clonazePAM, clotrimazole-betamethasone, fluconazole, zolpidem, gabapentin, lamoTRIgine, levothyroxine, B-D INS SYR ULTRAFINE 1CC/31G, doxycycline, insulin regular, glucose blood, ONETOUCH DELICA LANCETS 99991111, Insulin Syringe-Needle U-100, accu-chek softclix, and oxyCODONE.  No orders of the defined types were placed in this encounter.    Follow-up: No Follow-up on file.  Walker Kehr, MD

## 2016-07-19 NOTE — Patient Instructions (Signed)
McKenzie lumbar pillow 

## 2016-07-19 NOTE — Assessment & Plan Note (Signed)
Chronic OA, neuropathy On Oxycodone 15 mg; Gabapentin  Potential benefits of a long term opioids use as well as potential risks (i.e. addiction risk, apnea etc) and complications (i.e. Somnolence, constipation and others) were explained to the patient and were aknowledged. Risks of use w/benzodiazepines discussed  Do not take w/Clonazepam

## 2016-07-19 NOTE — Assessment & Plan Note (Signed)
Chronic OA On Oxycodone 15 mg  Potential benefits of a long term opioids use as well as potential risks (i.e. addiction risk, apnea etc) and complications (i.e. Somnolence, constipation and others) were explained to the patient and were aknowledged. Risks of use w/benzodiazepines discussed  Do not take w/Clonazepam 

## 2016-07-19 NOTE — Assessment & Plan Note (Signed)
Chronic, B feet involved -- Dr Loanne Drilling Humulin R and N Labs

## 2016-07-19 NOTE — Addendum Note (Signed)
Addended by: Cresenciano Lick on: 07/19/2016 03:42 PM   Modules accepted: Orders

## 2016-07-19 NOTE — Progress Notes (Signed)
Pre visit review using our clinic review tool, if applicable. No additional management support is needed unless otherwise documented below in the visit note. 

## 2016-07-20 LAB — HEPATITIS C ANTIBODY: HCV Ab: NEGATIVE

## 2016-08-19 ENCOUNTER — Ambulatory Visit: Payer: Medicare Other | Admitting: Endocrinology

## 2016-08-25 ENCOUNTER — Encounter: Payer: Self-pay | Admitting: Podiatry

## 2016-08-25 ENCOUNTER — Ambulatory Visit (INDEPENDENT_AMBULATORY_CARE_PROVIDER_SITE_OTHER): Payer: Medicare Other | Admitting: Podiatry

## 2016-08-25 DIAGNOSIS — E0842 Diabetes mellitus due to underlying condition with diabetic polyneuropathy: Secondary | ICD-10-CM | POA: Diagnosis not present

## 2016-08-25 DIAGNOSIS — E1140 Type 2 diabetes mellitus with diabetic neuropathy, unspecified: Secondary | ICD-10-CM

## 2016-08-25 DIAGNOSIS — Q828 Other specified congenital malformations of skin: Secondary | ICD-10-CM

## 2016-08-25 DIAGNOSIS — L84 Corns and callosities: Secondary | ICD-10-CM

## 2016-08-26 ENCOUNTER — Ambulatory Visit: Payer: Medicare Other | Admitting: Podiatry

## 2016-09-04 NOTE — Progress Notes (Signed)
Subjective:     Patient ID: Katherine Herrera, female   DOB: [birth year], 50 y.o.   MRN: [id number]  HPI patient states that overall she's doing well with lesion formation but she's noted no drainage or redness currently   Review of Systems     Objective:   Physical Exam Neurovascular status unchanged with long-term diabetic with patient noted to have keratotic lesion fifth metatarsal left underneath the right foot and lateral side of the foot with no erythema no edema or no drainage noted currently    Assessment:     Lesion formation secondary to pressure with no indications of current infective process    Plan:     Diabetic shoes were dispensed with instructions on usage and sharp debridement accomplished with no drainage noted or erythema edema. Encouraged to continue daily inspections and to come in when ever symptomatic or if any changes were to occur

## 2016-09-07 ENCOUNTER — Encounter: Payer: Self-pay | Admitting: Endocrinology

## 2016-09-07 ENCOUNTER — Ambulatory Visit (INDEPENDENT_AMBULATORY_CARE_PROVIDER_SITE_OTHER): Payer: Medicare Other | Admitting: Endocrinology

## 2016-09-07 DIAGNOSIS — E1140 Type 2 diabetes mellitus with diabetic neuropathy, unspecified: Secondary | ICD-10-CM | POA: Diagnosis not present

## 2016-09-07 MED ORDER — INSULIN REGULAR HUMAN 100 UNIT/ML IJ SOLN: INTRAMUSCULAR | 11 refills | Status: DC

## 2016-09-07 MED ORDER — LEVOTHYROXINE SODIUM 75 MCG PO TABS: 75.0000 ug | ORAL_TABLET | Freq: Every day | ORAL | 3 refills | Status: DC

## 2016-09-07 NOTE — Progress Notes (Signed)
Subjective:    Patient ID: Katherine Herrera, female    DOB: 08/16/1966, 50 y.o.   MRN: [id number]  HPI Pt returns for f/u of diabetes mellitus: DM type: Insulin-requiring type 2 Dx'ed: 123456 Complications: polyneuropathy and foot ulcers.  Therapy: insulin since 2012.   GDM: never. DKA: never Severe hypoglycemia: last episode was in late 2013.   Pancreatitis: never.   Other: she takes multiple daily injections; she takes human insulin, for cost reasons.   Interval history: She has mild hypoglycemia in the middle of the night, approx once per week.  It is highest at supper (200).  pt states she feels well in general.  pt states she feels well in general.  Past Medical History:  Diagnosis Date  . Anxiety   . COPD (chronic obstructive pulmonary disease) ([place])   . DM (diabetes mellitus) ([place])   . Gout    possible  . Hyperlipidemia   . Hypothyroidism   . LBP (low back pain)    DR Patrice [place]  . Migraine   . Peripheral neuropathy ([place])   . Syncope    poss conversion disorder vs psychogenic seizures  . Tachycardia    diet coke overuse?  Marland Kitchen Tenosynovitis of foot and ankle 09/03/2013  . TTS (tarsal tunnel syndrome) 2010   Left Foot [place] in [place]  . Vitamin D deficiency     Past Surgical History:  Procedure Laterality Date  . ABDOMINAL HYSTERECTOMY    . CARPAL TUNNEL RELEASE  2010   LEFT  . CERVICAL FUSION     C4-5    Social History   Social History  . Marital status: Married    Spouse name: N/A  . Number of children: N/A  . Years of education: N/A   Occupational History  . Not on file.   Social History Main Topics  . Smoking status: Former Smoker    Quit date: 12/01/2009  . Smokeless tobacco: Never Used  . Alcohol use No  . Drug use: No  . Sexual activity: Yes   Other Topics Concern  . Not on file   Social History Narrative   GYN - Dr Radene Knee      FAMILY HISTORY   Lung cancer   Mother w/alcoholism      Married 3d GS due Nov   Diet Coke: 10 cans a day      Regular exercise - NO      Former smoker 2009 restared 2010 stopped 12/2009    Current Outpatient Prescriptions on File Prior to Visit  Medication Sig Dispense Refill  . Albuterol Sulfate (PROAIR RESPICLICK) 123XX123 (90 BASE) MCG/ACT AEPB Inhale 1-2 puffs into the lungs 4 (four) times daily as needed. 1 each 5  . aspirin (BAYER ASPIRIN) 325 MG tablet Take 1 tablet (325 mg total) by mouth daily. 100 tablet 3  . baclofen (LIORESAL) 10 MG tablet Take 2 tablets (20 mg total) by mouth 2 (two) times daily. 360 tablet 1  . BD PEN NEEDLE NANO U/F 32G X 4 MM MISC     . Cholecalciferol (VITAMIN D3) 1000 UNIT tablet Take 2,000 Units by mouth daily.      . clonazePAM (KLONOPIN) 0.5 MG tablet TAKE ONE TABLET BY MOUTH 2 TIMES A DAY AS NEEDED FOR ANXIETY 60 tablet 2  . clotrimazole-betamethasone (LOTRISONE) cream Apply 1 application topically 2 (two) times daily. 45 g 1  . doxycycline (VIBRA-TABS) 100 MG tablet Take 1 tablet (100 mg total) by mouth 2 (two) times daily. [place]  tablet 0  . estradiol (ESTRACE) 1 MG tablet Take 1 mg by mouth daily.      . fluconazole (DIFLUCAN) 150 MG tablet Take 1 tablet (150 mg total) by mouth once. 3 tablet 1  . gabapentin (NEURONTIN) 800 MG tablet TAKE 1 TABLET FOUR TIMES DAILY 360 tablet 2  . glucose blood (ONETOUCH VERIO) test strip Use to check blood sugar 2 times per day. 100 each 12  . Insulin Syringe-Needle U-100 (B-D INS SYRINGE 0.5CC/31GX5/16) 31G X 5/16" 0.5 ML MISC USE AS DIRECTED WITH INSULIN 4 TIMES A DAY 400 each 2  . lamoTRIgine (LAMICTAL) 25 MG tablet Take 1 tablet (25 mg total) by mouth 2 (two) times daily. 180 tablet 1  . Lancet Devices (ACCU-CHEK SOFTCLIX) lancets Test two times daily 100 each 12  . Lancet Devices (EASY TOUCH LANCING DEVICE) MISC     . lidocaine (XYLOCAINE) 5 % ointment Apply 1 application topically as needed. 35.44 g 0  . ondansetron (ZOFRAN) 4 MG tablet Take 1 tablet (4 mg total) by mouth every 8 (eight) hours as needed for nausea or vomiting. 20  tablet 0  . ONETOUCH DELICA LANCETS 99991111 MISC Use to check blood sugar 2 times per day. 200 each 2  . oxyCODONE (ROXICODONE) 15 MG immediate release tablet Take 1 tablet (15 mg total) by mouth every 6 (six) hours as needed for pain. Please fill on or after 07/19/16. 120 tablet 0  . PRESCRIPTION MEDICATION Apply 1-2 g topically See admin instructions. Apply 1-2 grams (1-2 ML) to affected area 2 to 3 times a day Diclofenac/Baclofen/Bupivacane    . silver sulfADIAZINE (SILVADENE) 1 % cream Apply 1 application topically 2 (two) times daily. On the leg wound 25 g 0  . traMADol (ULTRAM) 50 MG tablet TAKE 1 TO 2 TABLETS TWICE DAILY AS NEEDED FOR HEADACHE 60 tablet 0  . vitamin B-12 (CYANOCOBALAMIN) 1000 MCG tablet Take 1,000 mcg by mouth daily.      Marland Kitchen ZETIA 10 MG tablet TAKE ONE TABLET BY MOUTH EVERY DAY 30 tablet 11  . zolpidem (AMBIEN) 10 MG tablet Take 0.5 tablets (5 mg total) by mouth at bedtime. 30 tablet 2   No current facility-administered medications on file prior to visit.     Allergies  Allergen Reactions  . Hydromorphone Hcl Itching  . Cephalexin Nausea And Vomiting  . Meperidine Rash  . Rosuvastatin Calcium Other (See Comments)    myalgia  . Crestor [Rosuvastatin Calcium]     myalgia  . Lovastatin Other (See Comments)    Unknown   . Metformin Nausea And Vomiting  . Niacin Other (See Comments)    Unknown   . Paroxetine Other (See Comments)    Unknown   . Meperidine Hcl Rash    Family History  Problem Relation Age of Onset  . Diabetes Mother   . Hypertension Mother   . Cancer Father 7    ? bone cancer  . [name] Brother     BP 122/70   Pulse 75   Ht 5\' 10"  (1.778 m)   Wt 237 lb (107.5 kg)   SpO2 96%   BMI 34.01 kg/m   Review of Systems Denies LOC    Objective:   Physical Exam VITAL SIGNS:  See vs page.   GENERAL: no distress.   Pulses: dorsalis pedis intact bilat.   MSK: no deformity of the feet.  CV: trace bilat leg edema.   Skin: there is a  healed ulcer at the  lateral aspect of the right foot.  normal color and temp on the feet.   Neuro: sensation is intact to touch on the feet, but decreased from normal.    Lab Results  Component Value Date   CREATININE 0.89 07/19/2016   BUN 12 07/19/2016   NA 135 07/19/2016   K 4.6 07/19/2016   CL 99 07/19/2016   CO2 32 07/19/2016   Lab Results  Component Value Date   TSH 1.16 07/19/2016   Lab Results  Component Value Date   HGBA1C 7.8 (H) 07/19/2016      Assessment & Plan:  Insulin-requiring type 2 DM: long duration of action is causing hypoglycemia.  Based on the pattern of her cbg's, she needs some adjustment in her therapy.  Patient is advised the following: Patient Instructions  check your blood sugar twice a day.  vary the time of day when you check, between before the 3 meals, and at bedtime.  also check if you have symptoms of your blood sugar being too high or too low.  please keep a record of the readings and bring it to your next appointment here.  please call us sooner if your blood sugar goes below 70, or if it stays over 200.    Please change reg insulin to 60 units with breakfast, and 30 units with supper. please come back for a follow-up appointment in 3-4 months.

## 2016-09-07 NOTE — Patient Instructions (Addendum)
check your blood sugar twice a day.  vary the time of day when you check, between before the 3 meals, and at bedtime.  also check if you have symptoms of your blood sugar being too high or too low.  please keep a record of the readings and bring it to your next appointment here.  please call us sooner if your blood sugar goes below 70, or if it stays over 200.    Please change reg insulin to 60 units with breakfast, and 30 units with supper. please come back for a follow-up appointment in 3-4 months.

## 2016-09-08 ENCOUNTER — Other Ambulatory Visit: Payer: Self-pay | Admitting: Endocrinology

## 2016-09-09 DIAGNOSIS — R600 Localized edema: Secondary | ICD-10-CM | POA: Diagnosis not present

## 2016-09-09 DIAGNOSIS — M79671 Pain in right foot: Secondary | ICD-10-CM | POA: Diagnosis not present

## 2016-09-19 DIAGNOSIS — M79671 Pain in right foot: Secondary | ICD-10-CM | POA: Diagnosis not present

## 2016-10-05 ENCOUNTER — Ambulatory Visit (INDEPENDENT_AMBULATORY_CARE_PROVIDER_SITE_OTHER): Payer: Medicare Other | Admitting: Podiatry

## 2016-10-05 ENCOUNTER — Encounter: Payer: Self-pay | Admitting: Podiatry

## 2016-10-05 DIAGNOSIS — Q828 Other specified congenital malformations of skin: Secondary | ICD-10-CM

## 2016-10-05 DIAGNOSIS — E1140 Type 2 diabetes mellitus with diabetic neuropathy, unspecified: Secondary | ICD-10-CM

## 2016-10-05 DIAGNOSIS — L84 Corns and callosities: Secondary | ICD-10-CM

## 2016-10-05 DIAGNOSIS — S92301A Fracture of unspecified metatarsal bone(s), right foot, initial encounter for closed fracture: Secondary | ICD-10-CM | POA: Diagnosis not present

## 2016-10-06 NOTE — Progress Notes (Signed)
Subjective:     Patient ID: Katherine Herrera, female   DOB: 09-Jan-1966, 50 y.o.   MRN: [id number]  HPI patient presents stating that the trimming is helping her immensely and she is not draining currently   Review of Systems     Objective:   Physical Exam Neurovascular status is intact with patient found to have severe keratotic lesion bilateral fifth metatarsal and base of fifth metatarsal right    Assessment:     Chronic lesion formation bilateral that's not currently ulcerated but pre-ulceration    Plan:     Deep debridement of tissue accomplished with sharp debridement no active drainage noted and reappoint in 4 weeks to continue this treatment and prevent ulceration. If any changes were to occur she is to reappoint immediately

## 2016-10-07 ENCOUNTER — Ambulatory Visit: Payer: Medicare Other | Admitting: Podiatry

## 2016-10-19 ENCOUNTER — Ambulatory Visit: Payer: Medicare Other | Admitting: Internal Medicine

## 2016-10-23 ENCOUNTER — Other Ambulatory Visit: Payer: Self-pay | Admitting: Internal Medicine

## 2016-10-27 ENCOUNTER — Telehealth: Payer: Self-pay | Admitting: Internal Medicine

## 2016-10-27 NOTE — Telephone Encounter (Signed)
[facility] - Client [facility]  Patient Name: Katherine Herrera  DOB: 10/15/1966    Initial Comment Caller states she thinks she has a blood clot. She is having leg pain that has spread over the past couple of days. There is a knot on leg that is red and bruised, looks like blood is coming up to surface around it.   Nurse Assessment  Nurse: Wayne Sever, RN, Tillie Rung Date/Time (Eastern Time): 10/27/2016 9:42:18 AM  Confirm and document reason for call. If symptomatic, describe symptoms. ---Caller states she is having right leg pain. She states she has knot and swelling on the side of it. She states it happened a few days and the knot popped up last night.  Does the patient have any new or worsening symptoms? ---Yes  Will a triage be completed? ---Yes  Related visit to physician within the last 2 weeks? ---No  Does the PT have any chronic conditions? (i.e. diabetes, asthma, etc.) ---Yes  List chronic conditions. ---Type 2 Diabetes  Is the patient pregnant or possibly pregnant? (Ask all females between the ages of 36-55) ---No  Is this a behavioral health or substance abuse call? ---No     Guidelines    Guideline Title Affirmed Question Affirmed Notes  Leg Pain Localized pain, redness or hard lump along vein    Final Disposition User   See Physician within 24 Hours [place], RN, Tillie Rung    Comments  Scheduled with Pricilla Holm, at 415p on 12/29   Referrals  REFERRED TO PCP OFFICE   Disagree/Comply: Comply

## 2016-10-28 ENCOUNTER — Ambulatory Visit (INDEPENDENT_AMBULATORY_CARE_PROVIDER_SITE_OTHER): Payer: Medicare Other | Admitting: Internal Medicine

## 2016-10-28 ENCOUNTER — Encounter: Payer: Self-pay | Admitting: Internal Medicine

## 2016-10-28 VITALS — BP 122/82 | HR 75 | Temp 97.5°F | Resp 14 | Ht 70.0 in | Wt 239.0 lb

## 2016-10-28 DIAGNOSIS — F17200 Nicotine dependence, unspecified, uncomplicated: Secondary | ICD-10-CM | POA: Diagnosis not present

## 2016-10-28 DIAGNOSIS — M7989 Other specified soft tissue disorders: Secondary | ICD-10-CM | POA: Diagnosis not present

## 2016-10-28 MED ORDER — ROPINIROLE HCL 0.5 MG PO TABS: 0.5000 mg | ORAL_TABLET | Freq: Every day | ORAL | 1 refills | Status: DC

## 2016-10-28 MED ORDER — DICLOFENAC SODIUM 1 % TD GEL: 2.0000 g | Freq: Four times a day (QID) | TRANSDERMAL | 6 refills | Status: DC

## 2016-10-28 NOTE — Patient Instructions (Signed)
We have ordered the ultrasound of the leg and this will be scheduled.   We have sent in a medicine called requip (ropinirole) that you can take in the evening for the pain in the legs.   We have also sent in a gel medicine for the pain in the legs that you can use up to three times per day called voltaren gel.

## 2016-10-28 NOTE — Assessment & Plan Note (Addendum)
Concerning given less activity over the last month as well as current smoker (1 PPD) and obese (BMI 34). Checking US of the leg to rule out blood clot. Taking aspirin 325 mg daily already so will leave that until results. Rx for ropinirole for pain and voltaren gel.

## 2016-10-28 NOTE — Progress Notes (Signed)
Pre visit review using our clinic review tool, if applicable. No additional management support is needed unless otherwise documented below in the visit note. 

## 2016-10-28 NOTE — Progress Notes (Signed)
   Subjective:    Patient ID: Katherine Herrera, female    DOB: 15-Nov-1965, 50 y.o.   MRN: [id number]  HPI The patient is a 50 YO female coming in for spot on her leg and pain. She has broken her foot recently and not as much activity lately. She is walking some. Saw her podiatrist about 1 month ago to discuss with them. It is still hurting. She also has a bruise and she is very concerned about blood clots. She is currently smoking about 1 PPD. The pain is above the knee and some bruising on the skin although she does not recall any injury or overuse. No falls recently.   Review of Systems  Constitutional: Positive for activity change. Negative for appetite change, fatigue, fever and unexpected weight change.  Respiratory: Negative.   Cardiovascular: Negative.   Gastrointestinal: Negative.   Musculoskeletal: Positive for arthralgias, gait problem and myalgias. Negative for back pain, joint swelling, neck pain and neck stiffness.  Skin: Positive for color change. Negative for rash and wound.      Objective:   Physical Exam  Constitutional: She is oriented to person, place, and time. She appears well-developed and well-nourished.  HENT:  Head: Normocephalic and atraumatic.  Eyes: EOM are normal.  Cardiovascular: Normal rate and regular rhythm.   Pulmonary/Chest: Effort normal. No respiratory distress. She has no wheezes. She has no rales.  Abdominal: Soft. She exhibits no distension. There is no tenderness. There is no rebound.  Musculoskeletal:  Some increased size of the right leg than left leg, no calf tenderness but pain above the knee and thigh.   Neurological: She is alert and oriented to person, place, and time.  Skin: Skin is warm and dry.   Vitals:   10/28/16 1620  BP: 122/82  Pulse: 75  Resp: 14  Temp: 97.5 F (36.4 C)  TempSrc: Oral  SpO2: 97%  Weight: 239 lb (108.4 kg)  Height: 5\' 10"  (1.778 m)      Assessment & Plan:

## 2016-11-01 ENCOUNTER — Encounter: Payer: Self-pay | Admitting: Internal Medicine

## 2016-11-01 DIAGNOSIS — E113293 Type 2 diabetes mellitus with mild nonproliferative diabetic retinopathy without macular edema, bilateral: Secondary | ICD-10-CM | POA: Diagnosis not present

## 2016-11-01 LAB — HM DIABETES EYE EXAM

## 2016-11-02 ENCOUNTER — Other Ambulatory Visit: Payer: Medicare Other

## 2016-11-09 ENCOUNTER — Ambulatory Visit (INDEPENDENT_AMBULATORY_CARE_PROVIDER_SITE_OTHER): Payer: Medicare Other | Admitting: Podiatry

## 2016-11-09 DIAGNOSIS — B351 Tinea unguium: Secondary | ICD-10-CM

## 2016-11-09 DIAGNOSIS — Q828 Other specified congenital malformations of skin: Secondary | ICD-10-CM | POA: Diagnosis not present

## 2016-11-09 DIAGNOSIS — L84 Corns and callosities: Secondary | ICD-10-CM

## 2016-11-09 DIAGNOSIS — M79606 Pain in leg, unspecified: Secondary | ICD-10-CM

## 2016-11-09 DIAGNOSIS — E0842 Diabetes mellitus due to underlying condition with diabetic polyneuropathy: Secondary | ICD-10-CM

## 2016-11-10 ENCOUNTER — Ambulatory Visit
Admission: RE | Admit: 2016-11-10 | Discharge: 2016-11-10 | Disposition: A | Discharge status: Home / Self Care | Payer: Medicare Other | Source: Ambulatory Visit | Attending: Internal Medicine | Admitting: Internal Medicine

## 2016-11-10 DIAGNOSIS — F17200 Nicotine dependence, unspecified, uncomplicated: Secondary | ICD-10-CM

## 2016-11-10 DIAGNOSIS — M7989 Other specified soft tissue disorders: Secondary | ICD-10-CM | POA: Diagnosis not present

## 2016-11-10 NOTE — Progress Notes (Signed)
Subjective:     Patient ID: Katherine Herrera, female   DOB: May 05, 1966, 51 y.o.   MRN: [id number]  HPI patient presents stating that she has chronic lesion on the fifth metatarsal head left and lesion on the fifth metatarsal head right and also nail disease with thick yellow brittle nails that become painful. This is been an ongoing event for her and were working on it periodically with no indications of infection currently   Review of Systems     Objective:   Physical Exam Neurovascular status was intact and checked with previous visit with patient having diminished neurological sensation. Patient's found to have a thick keratotic lesion sub-fifth metatarsal head left pre-ulcerative in its nature and lesion formation fifth right with long-term history of diabetes with diminishment of sharp [employer] vibratory. Patient's nails 1-5 both feet are incurvated and    Assessment:     Pre-ulcerative type callus fifth left secondary to neuropathic changes and generalized poor health along with nail disease 1-5 both feet and lesion formation    Plan:     H&P conditions reviewed and using sterile instrumentation debridement accomplished today along with debridement of nailbeds with no iatrogenic bleeding. Instructed on daily checks and if any redness any drainage any swelling or odor should occur she is to let us know immediately but at this time her areas are clean

## 2016-11-18 ENCOUNTER — Ambulatory Visit (INDEPENDENT_AMBULATORY_CARE_PROVIDER_SITE_OTHER): Payer: Medicare Other | Admitting: Internal Medicine

## 2016-11-18 ENCOUNTER — Encounter: Payer: Self-pay | Admitting: Internal Medicine

## 2016-11-18 DIAGNOSIS — J069 Acute upper respiratory infection, unspecified: Secondary | ICD-10-CM

## 2016-11-18 DIAGNOSIS — E785 Hyperlipidemia, unspecified: Secondary | ICD-10-CM

## 2016-11-18 DIAGNOSIS — E1140 Type 2 diabetes mellitus with diabetic neuropathy, unspecified: Secondary | ICD-10-CM

## 2016-11-18 DIAGNOSIS — L959 Vasculitis limited to the skin, unspecified: Secondary | ICD-10-CM

## 2016-11-18 DIAGNOSIS — E0842 Diabetes mellitus due to underlying condition with diabetic polyneuropathy: Secondary | ICD-10-CM | POA: Diagnosis not present

## 2016-11-18 DIAGNOSIS — B9789 Other viral agents as the cause of diseases classified elsewhere: Secondary | ICD-10-CM

## 2016-11-18 DIAGNOSIS — E538 Deficiency of other specified B group vitamins: Secondary | ICD-10-CM

## 2016-11-18 DIAGNOSIS — M5440 Lumbago with sciatica, unspecified side: Secondary | ICD-10-CM

## 2016-11-18 MED ORDER — OXYCODONE HCL 15 MG PO TABS: 15.0000 mg | ORAL_TABLET | Freq: Four times a day (QID) | ORAL | 0 refills | Status: DC | PRN

## 2016-11-18 MED ORDER — BACLOFEN 10 MG PO TABS: 20.0000 mg | ORAL_TABLET | Freq: Two times a day (BID) | ORAL | 1 refills | Status: DC

## 2016-11-18 NOTE — Assessment & Plan Note (Signed)
Get a flu shot in 1-2 weeks or when feeling better

## 2016-11-18 NOTE — Assessment & Plan Note (Signed)
On opioids  Potential benefits of a long term opioids use as well as potential risks (i.e. addiction risk, apnea etc) and complications (i.e. Somnolence, constipation and others) were explained to the patient and were aknowledged.

## 2016-11-18 NOTE — Patient Instructions (Signed)
Use over-the-counter  "cold" medicines  such as "Afrin" nasal spray for nasal congestion as directed instead. Use" Delsym" or" Robitussin" cough syrup varietis for cough.  You can use plain "Tylenol" or "Advil" for fever, chills and achyness. Use Halls or Ricola cough drops.   "Common cold" symptoms are usually triggered by a virus.  The antibiotics are usually not necessary. On average, a" viral cold" illness would take 4-7 days to resolve. Please, make an appointment if you are not better or if you're worse.  

## 2016-11-18 NOTE — Assessment & Plan Note (Signed)
In remission.

## 2016-11-18 NOTE — Progress Notes (Signed)
Subjective:  Patient ID: Katherine Herrera, female    DOB: 10/15/1966  Age: 51 y.o. MRN: [id number]  CC: No chief complaint on file.   HPI VONDELL TROTTIER presents for chronic pain, DM, B12 def. She had foot fx in Sept 2017 C/o chills, nausea starting this am  Outpatient Medications Prior to Visit  Medication Sig Dispense Refill  . Albuterol Sulfate (PROAIR RESPICLICK) 123XX123 (90 BASE) MCG/ACT AEPB Inhale 1-2 puffs into the lungs 4 (four) times daily as needed. 1 each 5  . aspirin (BAYER ASPIRIN) 325 MG tablet Take 1 tablet (325 mg total) by mouth daily. 100 tablet 3  . B-D INS SYR ULTRAFINE 1CC/31G 31G X 5/16" 1 ML MISC USE AS DIRECTED 100 each 0  . baclofen (LIORESAL) 10 MG tablet Take 2 tablets (20 mg total) by mouth 2 (two) times daily. 360 tablet 1  . BD PEN NEEDLE NANO U/F 32G X 4 MM MISC     . Cholecalciferol (VITAMIN D3) 1000 UNIT tablet Take 2,000 Units by mouth daily.      . clonazePAM (KLONOPIN) 0.5 MG tablet TAKE ONE TABLET BY MOUTH 2 TIMES A DAY AS NEEDED FOR ANXIETY 60 tablet 2  . clotrimazole-betamethasone (LOTRISONE) cream Apply 1 application topically 2 (two) times daily. 45 g 1  . diclofenac sodium (VOLTAREN) 1 % GEL Apply 2 g topically 4 (four) times daily. 100 g 6  . estradiol (ESTRACE) 1 MG tablet Take 1 mg by mouth daily.      . fluconazole (DIFLUCAN) 150 MG tablet Take 1 tablet (150 mg total) by mouth once. 3 tablet 1  . gabapentin (NEURONTIN) 800 MG tablet TAKE 1 TABLET FOUR TIMES DAILY 360 tablet 2  . glucose blood (ONETOUCH VERIO) test strip Use to check blood sugar 2 times per day. 100 each 12  . insulin regular (HUMULIN R) 100 units/mL injection 60 units with breakfast, and 30 units with supper. 30 mL 11  . Insulin Syringe-Needle U-100 (B-D INS SYRINGE 0.5CC/31GX5/16) 31G X 5/16" 0.5 ML MISC USE AS DIRECTED WITH INSULIN 4 TIMES A DAY 400 each 2  . lamoTRIgine (LAMICTAL) 25 MG tablet TAKE 1 TABLET BY MOUTH TWO  TIMES DAILY 180 tablet 0  . Lancet Devices (ACCU-CHEK  SOFTCLIX) lancets Test two times daily 100 each 12  . Lancet Devices (EASY TOUCH LANCING DEVICE) MISC     . levothyroxine (SYNTHROID, LEVOTHROID) 75 MCG tablet TAKE 1 TABLET BY MOUTH  DAILY 90 tablet 1  . lidocaine (XYLOCAINE) 5 % ointment Apply 1 application topically as needed. 35.44 g 0  . ondansetron (ZOFRAN) 4 MG tablet Take 1 tablet (4 mg total) by mouth every 8 (eight) hours as needed for nausea or vomiting. 20 tablet 0  . ONETOUCH DELICA LANCETS 99991111 MISC Use to check blood sugar 2 times per day. 200 each 2  . oxyCODONE (ROXICODONE) 15 MG immediate release tablet Take 1 tablet (15 mg total) by mouth every 6 (six) hours as needed for pain. Please fill on or after 07/19/16. 120 tablet 0  . PRESCRIPTION MEDICATION Apply 1-2 g topically See admin instructions. Apply 1-2 grams (1-2 ML) to affected area 2 to 3 times a day Diclofenac/Baclofen/Bupivacane    . rOPINIRole (REQUIP) 0.5 MG tablet Take 1 tablet (0.5 mg total) by mouth at bedtime. 30 tablet 1  . silver sulfADIAZINE (SILVADENE) 1 % cream Apply 1 application topically 2 (two) times daily. On the leg wound 25 g 0  . traMADol (ULTRAM) 50 MG  tablet TAKE 1 TO 2 TABLETS TWICE DAILY AS NEEDED FOR HEADACHE 60 tablet 0  . vitamin B-12 (CYANOCOBALAMIN) 1000 MCG tablet Take 1,000 mcg by mouth daily.      Marland Kitchen ZETIA 10 MG tablet TAKE ONE TABLET BY MOUTH EVERY DAY 30 tablet 11  . zolpidem (AMBIEN) 10 MG tablet Take 0.5 tablets (5 mg total) by mouth at bedtime. 30 tablet 2   No facility-administered medications prior to visit.     ROS Review of Systems  Constitutional: Positive for chills and fatigue. Negative for activity change, appetite change and unexpected weight change.  HENT: Negative for congestion, mouth sores and sinus pressure.   Eyes: Negative for visual disturbance.  Respiratory: Negative for cough and chest tightness.   Gastrointestinal: Positive for nausea. Negative for abdominal pain.  Genitourinary: Negative for difficulty  urinating, frequency and vaginal pain.  Musculoskeletal: Positive for arthralgias and back pain. Negative for gait problem.  Skin: Negative for pallor and rash.  Neurological: Negative for dizziness, tremors, weakness, numbness and headaches.  Psychiatric/Behavioral: Negative for confusion and sleep disturbance.    Objective:  BP 120/80   Pulse 89   Temp 97.9 F (36.6 C) (Oral)   Wt 238 lb (108 kg)   SpO2 96%   BMI 34.15 kg/m   BP Readings from Last 3 Encounters:  11/18/16 120/80  10/28/16 122/82  09/07/16 122/70    Wt Readings from Last 3 Encounters:  11/18/16 238 lb (108 kg)  10/28/16 239 lb (108.4 kg)  09/07/16 237 lb (107.5 kg)    Physical Exam  Constitutional: She appears well-developed. No distress.  HENT:  Head: Normocephalic.  Right Ear: External ear normal.  Left Ear: External ear normal.  Nose: Nose normal.  Mouth/Throat: Oropharynx is clear and moist.  Eyes: Conjunctivae are normal. Pupils are equal, round, and reactive to light. Right eye exhibits no discharge. Left eye exhibits no discharge.  Neck: Normal range of motion. Neck supple. No JVD present. No tracheal deviation present. No thyromegaly present.  Cardiovascular: Normal rate, regular rhythm and normal heart sounds.   Pulmonary/Chest: No stridor. No respiratory distress. She has no wheezes.  Abdominal: Soft. Bowel sounds are normal. She exhibits no distension and no mass. There is no tenderness. There is no rebound and no guarding.  Musculoskeletal: She exhibits tenderness. She exhibits no edema.  Lymphadenopathy:    She has no cervical adenopathy.  Neurological: She displays normal reflexes. No cranial nerve deficit. She exhibits normal muscle tone. Coordination normal.  Skin: No rash noted. No erythema.  Psychiatric: She has a normal mood and affect. Her behavior is normal. Judgment and thought content normal.  neck, LS - tender R foot - surgical shoe  Lab Results  Component Value Date   WBC  12.2 (H) 07/19/2016   HGB 15.4 (H) 07/19/2016   HCT 45.0 07/19/2016   PLT 287.0 07/19/2016   GLUCOSE 239 (H) 07/19/2016   CHOL 223 (H) 07/19/2016   TRIG (H) 07/19/2016    527.0 Triglyceride is over 400; calculations on Lipids are invalid.   HDL 27.40 (L) 07/19/2016   LDLDIRECT 106.0 07/19/2016   LDLCALC 81 01/02/2013   ALT 9 07/19/2016   AST 8 07/19/2016   NA 135 07/19/2016   K 4.6 07/19/2016   CL 99 07/19/2016   CREATININE 0.89 07/19/2016   BUN 12 07/19/2016   CO2 32 07/19/2016   TSH 1.16 07/19/2016   INR 0.95 01/28/2014   HGBA1C 7.8 (H) 07/19/2016   MICROALBUR 0.9 07/19/2016  US Venous Img Lower Unilateral Right  Result Date: 11/10/2016 CLINICAL DATA:  Right leg swelling x2 weeks EXAM: RIGHT LOWER EXTREMITY VENOUS DOPPLER ULTRASOUND TECHNIQUE: Gray-scale sonography with compression, as well as color and duplex ultrasound, were performed to evaluate the deep venous system from the level of the common femoral vein through the popliteal and proximal calf veins. COMPARISON:  None FINDINGS: Normal compressibility of the common femoral, superficial femoral, and popliteal veins, as well as the proximal calf veins. No filling defects to suggest DVT on grayscale or color Doppler imaging. Doppler waveforms show normal direction of venous flow, normal respiratory phasicity and response to augmentation. Visualized segments of the saphenous venous system normal in caliber and compressibility. Survey views of the contralateral common femoral vein are unremarkable. IMPRESSION: No evidence of  lower extremity deep vein thrombosis, right. Electronically Signed   By: Lucrezia Europe M.D.   On: 11/10/2016 15:43    Assessment & Plan:   There are no diagnoses linked to this encounter. I am having Ms. Wender maintain her estradiol, vitamin B-12, cholecalciferol, BD PEN NEEDLE NANO U/F, EASY TOUCH LANCING DEVICE, PRESCRIPTION MEDICATION, lidocaine, Albuterol Sulfate, aspirin, ondansetron, silver  sulfADIAZINE, traMADol, baclofen, ZETIA, clonazePAM, clotrimazole-betamethasone, fluconazole, zolpidem, gabapentin, glucose blood, ONETOUCH DELICA LANCETS 99991111, Insulin Syringe-Needle U-100, accu-chek softclix, oxyCODONE, insulin regular, B-D INS SYR ULTRAFINE 1CC/31G, lamoTRIgine, levothyroxine, diclofenac sodium, and rOPINIRole.  No orders of the defined types were placed in this encounter.    Follow-up: No Follow-up on file.  Walker Kehr, MD

## 2016-11-18 NOTE — Progress Notes (Signed)
Pre visit review using our clinic review tool, if applicable. No additional management support is needed unless otherwise documented below in the visit note. 

## 2016-11-18 NOTE — Assessment & Plan Note (Signed)
Baclofen prn

## 2016-11-18 NOTE — Assessment & Plan Note (Addendum)
Humulin R,N F/u w/Dr Loanne Drilling in Feb

## 2016-11-18 NOTE — Assessment & Plan Note (Signed)
On B12 

## 2016-11-18 NOTE — Assessment & Plan Note (Signed)
On Zetia 

## 2016-12-05 ENCOUNTER — Encounter (HOSPITAL_COMMUNITY): Payer: Self-pay | Admitting: Emergency Medicine

## 2016-12-05 ENCOUNTER — Encounter: Payer: Self-pay | Admitting: Internal Medicine

## 2016-12-05 ENCOUNTER — Emergency Department (HOSPITAL_COMMUNITY)
Admission: EM | Admit: 2016-12-05 | Discharge: 2016-12-05 | Disposition: A | Discharge status: Home / Self Care | Payer: Medicare Other | Attending: Emergency Medicine | Admitting: Emergency Medicine

## 2016-12-05 ENCOUNTER — Ambulatory Visit (INDEPENDENT_AMBULATORY_CARE_PROVIDER_SITE_OTHER): Payer: Medicare Other | Admitting: Internal Medicine

## 2016-12-05 DIAGNOSIS — Z5321 Procedure and treatment not carried out due to patient leaving prior to being seen by health care provider: Secondary | ICD-10-CM | POA: Diagnosis not present

## 2016-12-05 DIAGNOSIS — Z79890 Hormone replacement therapy (postmenopausal): Secondary | ICD-10-CM | POA: Diagnosis not present

## 2016-12-05 DIAGNOSIS — J069 Acute upper respiratory infection, unspecified: Secondary | ICD-10-CM | POA: Diagnosis not present

## 2016-12-05 DIAGNOSIS — E0590 Thyrotoxicosis, unspecified without thyrotoxic crisis or storm: Secondary | ICD-10-CM | POA: Diagnosis not present

## 2016-12-05 DIAGNOSIS — M5440 Lumbago with sciatica, unspecified side: Secondary | ICD-10-CM

## 2016-12-05 DIAGNOSIS — Z794 Long term (current) use of insulin: Secondary | ICD-10-CM | POA: Diagnosis not present

## 2016-12-05 DIAGNOSIS — Z888 Allergy status to other drugs, medicaments and biological substances status: Secondary | ICD-10-CM | POA: Diagnosis not present

## 2016-12-05 DIAGNOSIS — Z885 Allergy status to narcotic agent status: Secondary | ICD-10-CM | POA: Diagnosis not present

## 2016-12-05 DIAGNOSIS — E1165 Type 2 diabetes mellitus with hyperglycemia: Secondary | ICD-10-CM | POA: Insufficient documentation

## 2016-12-05 DIAGNOSIS — R110 Nausea: Secondary | ICD-10-CM | POA: Diagnosis not present

## 2016-12-05 DIAGNOSIS — E538 Deficiency of other specified B group vitamins: Secondary | ICD-10-CM

## 2016-12-05 DIAGNOSIS — R0602 Shortness of breath: Secondary | ICD-10-CM | POA: Diagnosis not present

## 2016-12-05 DIAGNOSIS — Z9114 Patient's other noncompliance with medication regimen: Secondary | ICD-10-CM | POA: Diagnosis not present

## 2016-12-05 DIAGNOSIS — E1140 Type 2 diabetes mellitus with diabetic neuropathy, unspecified: Secondary | ICD-10-CM

## 2016-12-05 DIAGNOSIS — B9789 Other viral agents as the cause of diseases classified elsewhere: Secondary | ICD-10-CM

## 2016-12-05 DIAGNOSIS — R5383 Other fatigue: Secondary | ICD-10-CM | POA: Diagnosis not present

## 2016-12-05 DIAGNOSIS — Z79899 Other long term (current) drug therapy: Secondary | ICD-10-CM | POA: Diagnosis not present

## 2016-12-05 DIAGNOSIS — Z9889 Other specified postprocedural states: Secondary | ICD-10-CM | POA: Diagnosis not present

## 2016-12-05 DIAGNOSIS — J449 Chronic obstructive pulmonary disease, unspecified: Secondary | ICD-10-CM | POA: Diagnosis not present

## 2016-12-05 LAB — POCT GLUCOSE (DEVICE FOR HOME USE): POC Glucose: 468 mg/dl — AB (ref 70–99)

## 2016-12-05 MED ORDER — AZITHROMYCIN 250 MG PO TABS: ORAL_TABLET | ORAL | 0 refills | Status: DC

## 2016-12-05 NOTE — Patient Instructions (Addendum)
Go to ER now

## 2016-12-05 NOTE — ED Triage Notes (Signed)
Pt sent by MD related to hyperglycemia; reading from office per chart 468; pt recent flu like symptoms and verbalizes has not taken medication for DM daily as prescribed.

## 2016-12-05 NOTE — Assessment & Plan Note (Signed)
On Oxycodone 

## 2016-12-05 NOTE — Progress Notes (Signed)
Subjective:  Patient ID: Katherine Herrera, female    DOB: [birth year]  Age: 51 y.o. MRN: [id number]  CC: No chief complaint on file.   HPI [name] presents for URI sx's x 1 week F/u DM, HTN, chronic pain. Pt states CBGs are 450-510 over past week C/o nausea: unable to eat right   Outpatient Medications Prior to Visit  Medication Sig Dispense Refill  . Albuterol Sulfate (PROAIR RESPICLICK) 123XX123 (90 BASE) MCG/ACT AEPB Inhale 1-2 puffs into the lungs 4 (four) times daily as needed. 1 each 5  . aspirin (BAYER ASPIRIN) 325 MG tablet Take 1 tablet (325 mg total) by mouth daily. 100 tablet 3  . B-D INS SYR ULTRAFINE 1CC/31G 31G X 5/16" 1 ML MISC USE AS DIRECTED 100 each 0  . baclofen (LIORESAL) 10 MG tablet Take 2 tablets (20 mg total) by mouth 2 (two) times daily. 360 tablet 1  . BD PEN NEEDLE NANO U/F 32G X 4 MM MISC     . Cholecalciferol (VITAMIN D3) 1000 UNIT tablet Take 2,000 Units by mouth daily.      . clonazePAM (KLONOPIN) 0.5 MG tablet TAKE ONE TABLET BY MOUTH 2 TIMES A DAY AS NEEDED FOR ANXIETY 60 tablet 2  . clotrimazole-betamethasone (LOTRISONE) cream Apply 1 application topically 2 (two) times daily. 45 g 1  . diclofenac sodium (VOLTAREN) 1 % GEL Apply 2 g topically 4 (four) times daily. 100 g 6  . estradiol (ESTRACE) 1 MG tablet Take 1 mg by mouth daily.      . fluconazole (DIFLUCAN) 150 MG tablet Take 1 tablet (150 mg total) by mouth once. 3 tablet 1  . gabapentin (NEURONTIN) 800 MG tablet TAKE 1 TABLET FOUR TIMES DAILY 360 tablet 2  . glucose blood (ONETOUCH VERIO) test strip Use to check blood sugar 2 times per day. 100 each 12  . insulin regular (HUMULIN R) 100 units/mL injection 60 units with breakfast, and 30 units with supper. 30 mL 11  . Insulin Syringe-Needle U-100 (B-D INS SYRINGE 0.5CC/31GX5/16) 31G X 5/16" 0.5 ML MISC USE AS DIRECTED WITH INSULIN 4 TIMES A DAY 400 each 2  . lamoTRIgine (LAMICTAL) 25 MG tablet TAKE 1 TABLET BY MOUTH TWO  TIMES DAILY 180 tablet 0    . Lancet Devices (ACCU-CHEK SOFTCLIX) lancets Test two times daily 100 each 12  . Lancet Devices (EASY TOUCH LANCING DEVICE) MISC     . levothyroxine (SYNTHROID, LEVOTHROID) 75 MCG tablet TAKE 1 TABLET BY MOUTH  DAILY 90 tablet 1  . lidocaine (XYLOCAINE) 5 % ointment Apply 1 application topically as needed. 35.44 g 0  . ondansetron (ZOFRAN) 4 MG tablet Take 1 tablet (4 mg total) by mouth every 8 (eight) hours as needed for nausea or vomiting. 20 tablet 0  . ONETOUCH DELICA LANCETS 99991111 MISC Use to check blood sugar 2 times per day. 200 each 2  . oxyCODONE (ROXICODONE) 15 MG immediate release tablet Take 1 tablet (15 mg total) by mouth every 6 (six) hours as needed for pain. Please fill on or after 01/17/17. 120 tablet 0  . PRESCRIPTION MEDICATION Apply 1-2 g topically See admin instructions. Apply 1-2 grams (1-2 ML) to affected area 2 to 3 times a day Diclofenac/Baclofen/Bupivacane    . rOPINIRole (REQUIP) 0.5 MG tablet Take 1 tablet (0.5 mg total) by mouth at bedtime. 30 tablet 1  . silver sulfADIAZINE (SILVADENE) 1 % cream Apply 1 application topically 2 (two) times daily. On the leg wound 25  g 0  . traMADol (ULTRAM) 50 MG tablet TAKE 1 TO 2 TABLETS TWICE DAILY AS NEEDED FOR HEADACHE 60 tablet 0  . vitamin B-12 (CYANOCOBALAMIN) 1000 MCG tablet Take 1,000 mcg by mouth daily.      Marland Kitchen ZETIA 10 MG tablet TAKE ONE TABLET BY MOUTH EVERY DAY 30 tablet 11  . zolpidem (AMBIEN) 10 MG tablet Take 0.5 tablets (5 mg total) by mouth at bedtime. 30 tablet 2   No facility-administered medications prior to visit.     ROS Review of Systems  Constitutional: Positive for chills. Negative for activity change, appetite change, fatigue and unexpected weight change.  HENT: Positive for congestion, postnasal drip, sinus pressure and sore throat. Negative for mouth sores.   Eyes: Negative for visual disturbance.  Respiratory: Negative for cough and chest tightness.   Gastrointestinal: Negative for abdominal pain  and nausea.  Genitourinary: Negative for difficulty urinating, frequency and vaginal pain.  Musculoskeletal: Positive for arthralgias, back pain and gait problem.  Skin: Negative for pallor and rash.  Neurological: Negative for dizziness, tremors, weakness, numbness and headaches.  Psychiatric/Behavioral: Negative for confusion and sleep disturbance.    Objective:  BP 138/78   Pulse 92   Temp 97.9 F (36.6 C) (Oral)   Resp 20   Wt 230 lb (104.3 kg)   SpO2 96%   BMI 33.00 kg/m   BP Readings from Last 3 Encounters:  12/05/16 138/78  11/18/16 120/80  10/28/16 122/82    Wt Readings from Last 3 Encounters:  12/05/16 230 lb (104.3 kg)  11/18/16 238 lb (108 kg)  10/28/16 239 lb (108.4 kg)    Physical Exam  Constitutional: She appears well-developed. No distress.  HENT:  Head: Normocephalic.  Right Ear: External ear normal.  Left Ear: External ear normal.  Nose: Nose normal.  Mouth/Throat: Oropharynx is clear and moist.  Eyes: Conjunctivae are normal. Pupils are equal, round, and reactive to light. Right eye exhibits no discharge. Left eye exhibits no discharge.  Neck: Normal range of motion. Neck supple. No JVD present. No tracheal deviation present. No thyromegaly present.  Cardiovascular: Normal rate, regular rhythm and normal heart sounds.   Pulmonary/Chest: No stridor. No respiratory distress. She has no wheezes.  Abdominal: Soft. Bowel sounds are normal. She exhibits no distension and no mass. There is no tenderness. There is no rebound and no guarding.  Musculoskeletal: She exhibits no edema or tenderness.  Lymphadenopathy:    She has no cervical adenopathy.  Neurological: She displays normal reflexes. No cranial nerve deficit. She exhibits normal muscle tone. Coordination abnormal.  Skin: No rash noted. No erythema.  Psychiatric: She has a normal mood and affect. Her behavior is normal. Judgment and thought content normal.  R foot is in a surgical shoe Looks  tired HEENT dry  Lab Results  Component Value Date   WBC 12.2 (H) 07/19/2016   HGB 15.4 (H) 07/19/2016   HCT 45.0 07/19/2016   PLT 287.0 07/19/2016   GLUCOSE 239 (H) 07/19/2016   CHOL 223 (H) 07/19/2016   TRIG (H) 07/19/2016    527.0 Triglyceride is over 400; calculations on Lipids are invalid.   HDL 27.40 (L) 07/19/2016   LDLDIRECT 106.0 07/19/2016   LDLCALC 81 01/02/2013   ALT 9 07/19/2016   AST 8 07/19/2016   NA 135 07/19/2016   K 4.6 07/19/2016   CL 99 07/19/2016   CREATININE 0.89 07/19/2016   BUN 12 07/19/2016   CO2 32 07/19/2016   TSH 1.16 07/19/2016  INR 0.95 01/28/2014   HGBA1C 7.8 (H) 07/19/2016   MICROALBUR 0.9 07/19/2016    US Venous Img Lower Unilateral Right  Result Date: 11/10/2016 CLINICAL DATA:  Right leg swelling x2 weeks EXAM: RIGHT LOWER EXTREMITY VENOUS DOPPLER ULTRASOUND TECHNIQUE: Gray-scale sonography with compression, as well as color and duplex ultrasound, were performed to evaluate the deep venous system from the level of the common femoral vein through the popliteal and proximal calf veins. COMPARISON:  None FINDINGS: Normal compressibility of the common femoral, superficial femoral, and popliteal veins, as well as the proximal calf veins. No filling defects to suggest DVT on grayscale or color Doppler imaging. Doppler waveforms show normal direction of venous flow, normal respiratory phasicity and response to augmentation. Visualized segments of the saphenous venous system normal in caliber and compressibility. Survey views of the contralateral common femoral vein are unremarkable. IMPRESSION: No evidence of  lower extremity deep vein thrombosis, right. Electronically Signed   By: Lucrezia Europe M.D.   On: 11/10/2016 15:43    Assessment & Plan:   There are no diagnoses linked to this encounter. I am having Ms. Rua maintain her estradiol, vitamin B-12, cholecalciferol, BD PEN NEEDLE NANO U/F, EASY TOUCH LANCING DEVICE, PRESCRIPTION MEDICATION,  lidocaine, Albuterol Sulfate, aspirin, ondansetron, silver sulfADIAZINE, traMADol, ZETIA, clonazePAM, clotrimazole-betamethasone, fluconazole, zolpidem, gabapentin, glucose blood, ONETOUCH DELICA LANCETS 99991111, Insulin Syringe-Needle U-100, accu-chek softclix, insulin regular, B-D INS SYR ULTRAFINE 1CC/31G, lamoTRIgine, levothyroxine, diclofenac sodium, rOPINIRole, baclofen, and oxyCODONE.  No orders of the defined types were placed in this encounter.    Follow-up: No Follow-up on file.  Walker Kehr, MD

## 2016-12-05 NOTE — Assessment & Plan Note (Signed)
On B12 

## 2016-12-05 NOTE — Progress Notes (Signed)
Pre visit review using our clinic review tool, if applicable. No additional management support is needed unless otherwise documented below in the visit note. 

## 2016-12-05 NOTE — Assessment & Plan Note (Signed)
Zpac if worse 

## 2016-12-05 NOTE — ED Notes (Signed)
Pt stated "was going to [place]" per front desk clerk.

## 2016-12-05 NOTE — Assessment & Plan Note (Signed)
Worse due to URI  Pt states CBGs are 450-510 over past week nausea: unable to eat right Go to ER now

## 2016-12-08 ENCOUNTER — Ambulatory Visit: Payer: Medicare Other | Admitting: Endocrinology

## 2016-12-09 ENCOUNTER — Other Ambulatory Visit: Payer: Self-pay | Admitting: Internal Medicine

## 2016-12-21 ENCOUNTER — Ambulatory Visit: Payer: Medicare Other | Admitting: Podiatry

## 2016-12-27 ENCOUNTER — Other Ambulatory Visit: Payer: Self-pay | Admitting: Endocrinology

## 2016-12-29 ENCOUNTER — Encounter: Payer: Self-pay | Admitting: Podiatry

## 2016-12-29 ENCOUNTER — Ambulatory Visit (INDEPENDENT_AMBULATORY_CARE_PROVIDER_SITE_OTHER): Payer: Medicare Other

## 2016-12-29 ENCOUNTER — Ambulatory Visit (INDEPENDENT_AMBULATORY_CARE_PROVIDER_SITE_OTHER): Payer: Medicare Other | Admitting: Podiatry

## 2016-12-29 DIAGNOSIS — M14671 Charcot's joint, right ankle and foot: Secondary | ICD-10-CM | POA: Diagnosis not present

## 2016-12-29 DIAGNOSIS — L84 Corns and callosities: Secondary | ICD-10-CM

## 2016-12-29 DIAGNOSIS — L97402 Non-pressure chronic ulcer of unspecified heel and midfoot with fat layer exposed: Secondary | ICD-10-CM

## 2016-12-30 NOTE — Progress Notes (Signed)
Subjective:     Patient ID: Katherine Herrera, female   DOB: 1966-01-02, 51 y.o.   MRN: [id number]  HPI patient presents with significant keratotic tissue plantar aspect both feet   Review of Systems     Objective:   Physical Exam Neurovascular status intact with significant lesions plantar aspect bilateral    Assessment:     Chronic keratotic lesion    Plan:     Debride lesions with no iatrogenic bleeding noted

## 2017-02-08 ENCOUNTER — Ambulatory Visit: Payer: Medicare Other | Admitting: Endocrinology

## 2017-02-09 ENCOUNTER — Ambulatory Visit (INDEPENDENT_AMBULATORY_CARE_PROVIDER_SITE_OTHER): Payer: Medicare Other | Admitting: Podiatry

## 2017-02-09 DIAGNOSIS — E1140 Type 2 diabetes mellitus with diabetic neuropathy, unspecified: Secondary | ICD-10-CM | POA: Diagnosis not present

## 2017-02-09 DIAGNOSIS — L97402 Non-pressure chronic ulcer of unspecified heel and midfoot with fat layer exposed: Secondary | ICD-10-CM

## 2017-02-12 NOTE — Progress Notes (Signed)
Subjective:     Patient ID: Katherine Herrera, female   DOB: 08/04/1966, 51 y.o.   MRN: [id number]  HPI patient presents with chronic lesion sub-fifth metatarsal left that has mild drainage noted but is localized with long-term history of diabetes and poor health   Review of Systems     Objective:   Physical Exam Neurovascular status diminished with history of amputation and patient found to have significant lesion sub-fifth metatarsal left that still present and flares up at different times    Assessment:     Localized ulceration with no proximal edema erythema drainage head of fifth metatarsal left    Plan:     Debridement of lesion left which was limited to superficial tissue with no deep drainage probe area did not feel anything deep and flushed the area applied Iodosorb with thick padding to take pressure off it and recommended diabetic shoes we will get her approved for these to her family physician

## 2017-02-15 ENCOUNTER — Other Ambulatory Visit: Payer: Self-pay

## 2017-02-15 ENCOUNTER — Ambulatory Visit (INDEPENDENT_AMBULATORY_CARE_PROVIDER_SITE_OTHER): Payer: Medicare Other | Admitting: Endocrinology

## 2017-02-15 VITALS — BP 132/74 | HR 74 | Ht 69.5 in | Wt 233.8 lb

## 2017-02-15 DIAGNOSIS — E1140 Type 2 diabetes mellitus with diabetic neuropathy, unspecified: Secondary | ICD-10-CM | POA: Diagnosis not present

## 2017-02-15 LAB — POCT GLYCOSYLATED HEMOGLOBIN (HGB A1C): Hemoglobin A1C: 9.8

## 2017-02-15 MED ORDER — INSULIN REGULAR HUMAN 100 UNIT/ML IJ SOLN
INTRAMUSCULAR | 11 refills | Status: DC
Start: 2017-02-15 — End: 2017-08-09

## 2017-02-15 NOTE — Telephone Encounter (Signed)
Patient hasnt been prescribed RX since 08/17/2015, okay to refill?

## 2017-02-15 NOTE — Progress Notes (Signed)
Subjective:    Patient ID: Katherine Herrera, female    DOB: Sep 10, 1966, 51 y.o.   MRN: [id number]  HPI Pt returns for f/u of diabetes mellitus: DM type: Insulin-requiring type 2.  Dx'ed: 2006.   Complications: polyneuropathy and foot ulcers.  Therapy: insulin since 2012.   GDM: never. DKA: never.   Severe hypoglycemia: last episode was in late 2013.   Pancreatitis: never.   Other: she takes multiple daily injections; she takes human insulin, for cost reasons; due to long duration of action, she does not need basal insulin.   Interval history: She seldom has hypoglycemia, and these episodes are mild.  pt states she feels well in general.  pt states she feels well in general.  She says she seldom misses the insulin.  No recent steroids.  no cbg record, but states cbg's are highest at hs, and lowest in the afternoon.    Past Medical History:  Diagnosis Date  . Anxiety   . COPD (chronic obstructive pulmonary disease) ([place])   . DM (diabetes mellitus) ([place])   . Gout    possible  . Hyperlipidemia   . Hypothyroidism   . LBP (low back pain)    DR Patrice Paradise  . Migraine   . Peripheral neuropathy ([place])   . Syncope    poss conversion disorder vs psychogenic seizures  . Tachycardia    diet coke overuse?  Marland Kitchen Tenosynovitis of foot and ankle 09/03/2013  . TTS (tarsal tunnel syndrome) 2010   Left Foot [facility] in [place]  . Vitamin D deficiency     Past Surgical History:  Procedure Laterality Date  . ABDOMINAL HYSTERECTOMY    . CARPAL TUNNEL RELEASE  2010   LEFT  . CERVICAL FUSION     C4-5    Social History   Social History  . Marital status: Married    Spouse name: N/A  . Number of children: N/A  . Years of education: N/A   Occupational History  . Not on file.   Social History Main Topics  . Smoking status: Current Every Day Smoker    Packs/day: 1.00    Last attempt to quit: 12/01/2009  . Smokeless tobacco: Never Used  . Alcohol use No  . Drug use: No  . Sexual activity: Yes    Other Topics Concern  . Not on file   Social History Narrative   GYN - Dr Radene Knee      FAMILY HISTORY   Lung cancer   Mother w/alcoholism      Married 3d GS due Nov   Diet Coke: 10 cans a day      Regular exercise - NO      Former smoker 2009 restared 2010 stopped 12/2009    Current Outpatient Prescriptions on File Prior to Visit  Medication Sig Dispense Refill  . aspirin (BAYER ASPIRIN) 325 MG tablet Take 1 tablet (325 mg total) by mouth daily. 100 tablet 3  . B-D INS SYR ULTRAFINE 1CC/31G 31G X 5/16" 1 ML MISC USE AS DIRECTED 100 each 0  . BD PEN NEEDLE NANO U/F 32G X 4 MM MISC     . Cholecalciferol (VITAMIN D3) 1000 UNIT tablet Take 2,000 Units by mouth daily.      . clonazePAM (KLONOPIN) 0.5 MG tablet TAKE ONE TABLET BY MOUTH 2 TIMES A DAY AS NEEDED FOR ANXIETY 60 tablet 2  . clotrimazole-betamethasone (LOTRISONE) cream Apply 1 application topically 2 (two) times daily. 45 g 1  . estradiol (ESTRACE)  1 MG tablet Take 1 mg by mouth daily.      Marland Kitchen gabapentin (NEURONTIN) 800 MG tablet TAKE 1 TABLET FOUR TIMES  DAILY 360 tablet 1  . glucose blood (ONETOUCH VERIO) test strip Use to check blood sugar 2 times per day. 100 each 12  . Insulin Syringe-Needle U-100 (B-D INS SYRINGE 0.5CC/31GX5/16) 31G X 5/16" 0.5 ML MISC USE AS DIRECTED WITH INSULIN 4 TIMES A DAY 400 each 2  . lamoTRIgine (LAMICTAL) 25 MG tablet TAKE 1 TABLET BY MOUTH TWO  TIMES DAILY 180 tablet 0  . Lancet Devices (ACCU-CHEK SOFTCLIX) lancets Test two times daily 100 each 12  . Lancet Devices (EASY TOUCH LANCING DEVICE) MISC     . levothyroxine (SYNTHROID, LEVOTHROID) 75 MCG tablet TAKE 1 TABLET BY MOUTH  DAILY 90 tablet 1  . ondansetron (ZOFRAN) 4 MG tablet Take 1 tablet (4 mg total) by mouth every 8 (eight) hours as needed for nausea or vomiting. 20 tablet 0  . ONETOUCH DELICA LANCETS 16X MISC Use to check blood sugar 2 times per day. 200 each 2  . oxyCODONE (ROXICODONE) 15 MG immediate release tablet Take 1 tablet  (15 mg total) by mouth every 6 (six) hours as needed for pain. Please fill on or after 01/17/17. 120 tablet 0  . rOPINIRole (REQUIP) 0.5 MG tablet Take 1 tablet (0.5 mg total) by mouth at bedtime. 30 tablet 1  . traMADol (ULTRAM) 50 MG tablet TAKE 1 TO 2 TABLETS TWICE DAILY AS NEEDED FOR HEADACHE 60 tablet 0  . vitamin B-12 (CYANOCOBALAMIN) 1000 MCG tablet Take 1,000 mcg by mouth daily.      Marland Kitchen zolpidem (AMBIEN) 10 MG tablet Take 0.5 tablets (5 mg total) by mouth at bedtime. 30 tablet 2  . Albuterol Sulfate (PROAIR RESPICLICK) 096 (90 BASE) MCG/ACT AEPB Inhale 1-2 puffs into the lungs 4 (four) times daily as needed. (Patient not taking: Reported on 02/15/2017) 1 each 5  . baclofen (LIORESAL) 10 MG tablet Take 2 tablets (20 mg total) by mouth 2 (two) times daily. (Patient not taking: Reported on 02/15/2017) 360 tablet 1  . diclofenac sodium (VOLTAREN) 1 % GEL Apply 2 g topically 4 (four) times daily. (Patient not taking: Reported on 02/15/2017) 100 g 6  . ezetimibe (ZETIA) 10 MG tablet TAKE ONE TABLET BY MOUTH EVERY DAY (Patient not taking: Reported on 02/15/2017) 30 tablet 5  . fluconazole (DIFLUCAN) 150 MG tablet Take 1 tablet (150 mg total) by mouth once. (Patient not taking: Reported on 02/15/2017) 3 tablet 1  . lidocaine (XYLOCAINE) 5 % ointment Apply 1 application topically as needed. (Patient not taking: Reported on 02/15/2017) 35.44 g 0  . PRESCRIPTION MEDICATION Apply 1-2 g topically See admin instructions. Apply 1-2 grams (1-2 ML) to affected area 2 to 3 times a day Diclofenac/Baclofen/Bupivacane    . silver sulfADIAZINE (SILVADENE) 1 % cream Apply 1 application topically 2 (two) times daily. On the leg wound (Patient not taking: Reported on 02/15/2017) 25 g 0   No current facility-administered medications on file prior to visit.     Allergies  Allergen Reactions  . Hydromorphone Hcl Itching  . Cephalexin Nausea And Vomiting  . Meperidine Rash  . Rosuvastatin Calcium Other (See Comments)     myalgia  . Crestor [Rosuvastatin Calcium]     myalgia  . Lovastatin Other (See Comments)    Unknown   . Metformin Nausea And Vomiting  . Niacin Other (See Comments)    Unknown   . Paroxetine  Other (See Comments)    Unknown   . Meperidine Hcl Rash    Family History  Problem Relation Age of Onset  . Diabetes Mother   . Hypertension Mother   . Cancer Father 85    ? bone cancer  . [name] Brother     BP 132/74   Pulse 74   Ht 5' 9.5" (1.765 m)   Wt 233 lb 12.8 oz (106.1 kg)   BMI 34.03 kg/m   Review of Systems Denies LOC    Objective:   Physical Exam VITAL SIGNS:  See vs page.   GENERAL: no distress.   Pulses: dorsalis pedis intact bilat.   MSK: no deformity of the feet.  CV: trace bilat leg edema.   Skin: there is a healed ulcer at the lateral aspect of the right foot.  There is a patchy, scaly rash (sees podiatry).  Otherwise normal color and temp on the feet.   Neuro: sensation is intact to touch on the feet, but decreased from normal.    Lab Results  Component Value Date   HGBA1C 9.8 02/15/2017      Assessment & Plan:  Insulin-requiring type 2 DM, with polyneuropathy: worse.  Patient Instructions  check your blood sugar twice a day.  vary the time of day when you check, between before the 3 meals, and at bedtime.  also check if you have symptoms of your blood sugar being too high or too low.  please keep a record of the readings and bring it to your next appointment here.  please call us sooner if your blood sugar goes below 70, or if it stays over 200.   Please increase the regular to the amounts noted below.    Please call us next week, to tell us how the blood sugar is doing.   please come back for a follow-up appointment in 3 months.

## 2017-02-15 NOTE — Patient Instructions (Addendum)
check your blood sugar twice a day.  vary the time of day when you check, between before the 3 meals, and at bedtime.  also check if you have symptoms of your blood sugar being too high or too low.  please keep a record of the readings and bring it to your next appointment here.  please call us sooner if your blood sugar goes below 70, or if it stays over 200.   Please increase the regular to the amounts noted below.    Please call us next week, to tell us how the blood sugar is doing.   please come back for a follow-up appointment in 3 months.

## 2017-02-17 ENCOUNTER — Encounter: Payer: Self-pay | Admitting: Internal Medicine

## 2017-02-17 ENCOUNTER — Ambulatory Visit (INDEPENDENT_AMBULATORY_CARE_PROVIDER_SITE_OTHER): Payer: Medicare Other | Admitting: Internal Medicine

## 2017-02-17 VITALS — BP 112/68 | HR 80 | Temp 98.2°F | Ht 69.5 in | Wt 235.0 lb

## 2017-02-17 DIAGNOSIS — L959 Vasculitis limited to the skin, unspecified: Secondary | ICD-10-CM

## 2017-02-17 DIAGNOSIS — R000 Tachycardia, unspecified: Secondary | ICD-10-CM

## 2017-02-17 DIAGNOSIS — E1140 Type 2 diabetes mellitus with diabetic neuropathy, unspecified: Secondary | ICD-10-CM

## 2017-02-17 DIAGNOSIS — E034 Atrophy of thyroid (acquired): Secondary | ICD-10-CM

## 2017-02-17 DIAGNOSIS — M5440 Lumbago with sciatica, unspecified side: Secondary | ICD-10-CM | POA: Diagnosis not present

## 2017-02-17 DIAGNOSIS — R110 Nausea: Secondary | ICD-10-CM | POA: Diagnosis not present

## 2017-02-17 DIAGNOSIS — E538 Deficiency of other specified B group vitamins: Secondary | ICD-10-CM

## 2017-02-17 DIAGNOSIS — Z23 Encounter for immunization: Secondary | ICD-10-CM

## 2017-02-17 MED ORDER — OXYCODONE HCL 15 MG PO TABS: 15.0000 mg | ORAL_TABLET | Freq: Four times a day (QID) | ORAL | 0 refills | Status: DC | PRN

## 2017-02-17 MED ORDER — CLOTRIMAZOLE-BETAMETHASONE 1-0.05 % EX CREA
1.0000 | TOPICAL_CREAM | Freq: Two times a day (BID) | CUTANEOUS | 1 refills | Status: DC
Start: 2017-02-17 — End: 2018-09-28

## 2017-02-17 MED ORDER — CLONAZEPAM 0.5 MG PO TABS
ORAL_TABLET | ORAL | 2 refills | Status: DC
Start: 2017-02-17 — End: 2017-12-27

## 2017-02-17 NOTE — Progress Notes (Signed)
Pre visit review using our clinic review tool, if applicable. No additional management support is needed unless otherwise documented below in the visit note. 

## 2017-02-17 NOTE — Assessment & Plan Note (Signed)
Humulin R, N F/u w/Dr Loanne Drilling

## 2017-02-17 NOTE — Progress Notes (Signed)
Subjective:  Patient ID: Katherine Herrera, female    DOB: 1966-08-21  Age: 51 y.o. MRN: [id number]  CC: No chief complaint on file.   HPI GWENITH TSCHIDA presents for DM, HTN, LBP f/u  Outpatient Medications Prior to Visit  Medication Sig Dispense Refill  . Albuterol Sulfate (PROAIR RESPICLICK) 782 (90 BASE) MCG/ACT AEPB Inhale 1-2 puffs into the lungs 4 (four) times daily as needed. 1 each 5  . aspirin (BAYER ASPIRIN) 325 MG tablet Take 1 tablet (325 mg total) by mouth daily. 100 tablet 3  . B-D INS SYR ULTRAFINE 1CC/31G 31G X 5/16" 1 ML MISC USE AS DIRECTED 100 each 0  . baclofen (LIORESAL) 10 MG tablet Take 2 tablets (20 mg total) by mouth 2 (two) times daily. 360 tablet 1  . BD PEN NEEDLE NANO U/F 32G X 4 MM MISC     . Cholecalciferol (VITAMIN D3) 1000 UNIT tablet Take 2,000 Units by mouth daily.      . clonazePAM (KLONOPIN) 0.5 MG tablet TAKE ONE TABLET BY MOUTH 2 TIMES A DAY AS NEEDED FOR ANXIETY 60 tablet 2  . clotrimazole-betamethasone (LOTRISONE) cream Apply 1 application topically 2 (two) times daily. 45 g 1  . diclofenac sodium (VOLTAREN) 1 % GEL Apply 2 g topically 4 (four) times daily. 100 g 6  . estradiol (ESTRACE) 1 MG tablet Take 1 mg by mouth daily.      Marland Kitchen ezetimibe (ZETIA) 10 MG tablet TAKE ONE TABLET BY MOUTH EVERY DAY 30 tablet 5  . fluconazole (DIFLUCAN) 150 MG tablet Take 1 tablet (150 mg total) by mouth once. 3 tablet 1  . gabapentin (NEURONTIN) 800 MG tablet TAKE 1 TABLET FOUR TIMES  DAILY 360 tablet 1  . glucose blood (ONETOUCH VERIO) test strip Use to check blood sugar 2 times per day. 100 each 12  . insulin regular (HUMULIN R) 250 units/2.41mL (100 units/mL) injection 65 units with breakfast, and 45 units with supper, and syringes 2/day 40 mL 11  . Insulin Syringe-Needle U-100 (B-D INS SYRINGE 0.5CC/31GX5/16) 31G X 5/16" 0.5 ML MISC USE AS DIRECTED WITH INSULIN 4 TIMES A DAY 400 each 2  . lamoTRIgine (LAMICTAL) 25 MG tablet TAKE 1 TABLET BY MOUTH TWO  TIMES DAILY  180 tablet 0  . Lancet Devices (ACCU-CHEK SOFTCLIX) lancets Test two times daily 100 each 12  . Lancet Devices (EASY TOUCH LANCING DEVICE) MISC     . levothyroxine (SYNTHROID, LEVOTHROID) 75 MCG tablet TAKE 1 TABLET BY MOUTH  DAILY 90 tablet 1  . lidocaine (XYLOCAINE) 5 % ointment Apply 1 application topically as needed. 35.44 g 0  . ondansetron (ZOFRAN) 4 MG tablet Take 1 tablet (4 mg total) by mouth every 8 (eight) hours as needed for nausea or vomiting. 20 tablet 0  . ONETOUCH DELICA LANCETS 42P MISC Use to check blood sugar 2 times per day. 200 each 2  . oxyCODONE (ROXICODONE) 15 MG immediate release tablet Take 1 tablet (15 mg total) by mouth every 6 (six) hours as needed for pain. Please fill on or after 01/17/17. 120 tablet 0  . PRESCRIPTION MEDICATION Apply 1-2 g topically See admin instructions. Apply 1-2 grams (1-2 ML) to affected area 2 to 3 times a day Diclofenac/Baclofen/Bupivacane    . rOPINIRole (REQUIP) 0.5 MG tablet Take 1 tablet (0.5 mg total) by mouth at bedtime. 30 tablet 1  . silver sulfADIAZINE (SILVADENE) 1 % cream Apply 1 application topically 2 (two) times daily. On the leg wound  25 g 0  . traMADol (ULTRAM) 50 MG tablet TAKE 1 TO 2 TABLETS TWICE DAILY AS NEEDED FOR HEADACHE 60 tablet 0  . vitamin B-12 (CYANOCOBALAMIN) 1000 MCG tablet Take 1,000 mcg by mouth daily.      Marland Kitchen zolpidem (AMBIEN) 10 MG tablet Take 0.5 tablets (5 mg total) by mouth at bedtime. 30 tablet 2   No facility-administered medications prior to visit.     ROS Review of Systems  Constitutional: Positive for fatigue. Negative for activity change, appetite change, chills and unexpected weight change.  HENT: Negative for congestion, mouth sores and sinus pressure.   Eyes: Negative for visual disturbance.  Respiratory: Negative for cough and chest tightness.   Gastrointestinal: Negative for abdominal pain and nausea.  Genitourinary: Negative for difficulty urinating, frequency and vaginal pain.    Musculoskeletal: Positive for arthralgias and back pain. Negative for gait problem.  Skin: Negative for pallor and rash.  Neurological: Negative for dizziness, tremors, weakness, numbness and headaches.  Psychiatric/Behavioral: Negative for confusion, sleep disturbance and suicidal ideas.    Objective:  BP 112/68 (BP Location: Left Arm, Patient Position: Sitting, Cuff Size: Large)   Pulse 80   Temp 98.2 F (36.8 C) (Oral)   Ht 5' 9.5" (1.765 m)   Wt 235 lb 0.6 oz (106.6 kg)   SpO2 98%   BMI 34.21 kg/m   BP Readings from Last 3 Encounters:  02/17/17 112/68  02/15/17 132/74  12/05/16 137/83    Wt Readings from Last 3 Encounters:  02/17/17 235 lb 0.6 oz (106.6 kg)  02/15/17 233 lb 12.8 oz (106.1 kg)  12/05/16 225 lb (102.1 kg)    Physical Exam  Constitutional: She appears well-developed. No distress.  HENT:  Head: Normocephalic.  Right Ear: External ear normal.  Left Ear: External ear normal.  Nose: Nose normal.  Mouth/Throat: Oropharynx is clear and moist.  Eyes: Conjunctivae are normal. Pupils are equal, round, and reactive to light. Right eye exhibits no discharge. Left eye exhibits no discharge.  Neck: Normal range of motion. Neck supple. No JVD present. No tracheal deviation present. No thyromegaly present.  Cardiovascular: Normal rate, regular rhythm and normal heart sounds.   Pulmonary/Chest: No stridor. No respiratory distress. She has no wheezes.  Abdominal: Soft. Bowel sounds are normal. She exhibits no distension and no mass. There is no tenderness. There is no rebound and no guarding.  Musculoskeletal: She exhibits no edema or tenderness.  Lymphadenopathy:    She has no cervical adenopathy.  Neurological: She displays normal reflexes. No cranial nerve deficit. She exhibits normal muscle tone. Coordination normal.  Skin: No rash noted. No erythema.  Psychiatric: She has a normal mood and affect. Her behavior is normal. Judgment and thought content normal.   skin irrit behind B ears - scaly  Lab Results  Component Value Date   WBC 12.2 (H) 07/19/2016   HGB 15.4 (H) 07/19/2016   HCT 45.0 07/19/2016   PLT 287.0 07/19/2016   GLUCOSE 239 (H) 07/19/2016   CHOL 223 (H) 07/19/2016   TRIG (H) 07/19/2016    527.0 Triglyceride is over 400; calculations on Lipids are invalid.   HDL 27.40 (L) 07/19/2016   LDLDIRECT 106.0 07/19/2016   LDLCALC 81 01/02/2013   ALT 9 07/19/2016   AST 8 07/19/2016   NA 135 07/19/2016   K 4.6 07/19/2016   CL 99 07/19/2016   CREATININE 0.89 07/19/2016   BUN 12 07/19/2016   CO2 32 07/19/2016   TSH 1.16 07/19/2016  INR 0.95 01/28/2014   HGBA1C 9.8 02/15/2017   MICROALBUR 0.9 07/19/2016    No results found.  Assessment & Plan:   There are no diagnoses linked to this encounter. I am having Ms. Seibert maintain her estradiol, vitamin B-12, cholecalciferol, BD PEN NEEDLE NANO U/F, EASY TOUCH LANCING DEVICE, PRESCRIPTION MEDICATION, lidocaine, Albuterol Sulfate, aspirin, ondansetron, silver sulfADIAZINE, traMADol, clonazePAM, clotrimazole-betamethasone, fluconazole, zolpidem, glucose blood, ONETOUCH DELICA LANCETS 83M, Insulin Syringe-Needle U-100, accu-chek softclix, B-D INS SYR ULTRAFINE 1CC/31G, lamoTRIgine, levothyroxine, diclofenac sodium, rOPINIRole, baclofen, oxyCODONE, ezetimibe, gabapentin, and insulin regular.  No orders of the defined types were placed in this encounter.    Follow-up: No Follow-up on file.  Walker Kehr, MD

## 2017-02-17 NOTE — Assessment & Plan Note (Signed)
No relapse 

## 2017-02-17 NOTE — Assessment & Plan Note (Signed)
On Levothroid 

## 2017-02-17 NOTE — Assessment & Plan Note (Signed)
?  diabetic gastroparesis

## 2017-02-17 NOTE — Assessment & Plan Note (Signed)
Monitor HR

## 2017-02-17 NOTE — Patient Instructions (Signed)
MC well w/Jill 

## 2017-02-17 NOTE — Assessment & Plan Note (Signed)
On B12 

## 2017-02-17 NOTE — Assessment & Plan Note (Signed)
Oxycodone  °

## 2017-03-10 ENCOUNTER — Other Ambulatory Visit: Payer: Self-pay | Admitting: Internal Medicine

## 2017-03-15 ENCOUNTER — Other Ambulatory Visit: Payer: Self-pay | Admitting: Internal Medicine

## 2017-03-15 ENCOUNTER — Other Ambulatory Visit: Payer: Self-pay | Admitting: Endocrinology

## 2017-03-22 ENCOUNTER — Encounter: Payer: Self-pay | Admitting: Podiatry

## 2017-03-22 ENCOUNTER — Ambulatory Visit (INDEPENDENT_AMBULATORY_CARE_PROVIDER_SITE_OTHER): Payer: Medicare Other | Admitting: Podiatry

## 2017-03-22 DIAGNOSIS — E0842 Diabetes mellitus due to underlying condition with diabetic polyneuropathy: Secondary | ICD-10-CM

## 2017-03-22 DIAGNOSIS — L97402 Non-pressure chronic ulcer of unspecified heel and midfoot with fat layer exposed: Secondary | ICD-10-CM

## 2017-03-22 NOTE — Patient Instructions (Signed)

## 2017-03-25 NOTE — Progress Notes (Signed)
Subjective:    Patient ID: Katherine Herrera, female   DOB: 51 y.o.   MRN: [id number]   HPI patient presents with chronic lesion underneath the fifth metatarsal left with breakdown of tissue that only extends through skin with minimal subcutaneous exposure but quite a bit of pain around the area and presents to pickup diabetic shoes to try to reduce weightbearing pressure and to try to reduce the pressure against the fifth metatarsal    ROS      Objective:  Physical Exam Vasco status intact diminished neurological sensation consistent with previous visit with patient found to have severe thickness of the plantar lesion fifth metatarsal left with keratotic tissue formation mild breakdown of tissue within the central portion of it and pain when palpated    Assessment:   Chronic lesion secondary to long-term diabetic neuropathy and abnormal weightbearing        Plan:    Reviewed case with patient and diabetic shoes were dispensed by pedal at this and found to fit appropriately with custom insoles to reduce the plantar pressure which may need to be modified. At this time debrided the lesion fully and took it down and did not find any bone probing when I checked the center the lesion flushed the area applied padding with Iodosorb and instructed on home care for this. Reappoint 4 weeks or earlier if any issues should occur

## 2017-04-17 ENCOUNTER — Ambulatory Visit (INDEPENDENT_AMBULATORY_CARE_PROVIDER_SITE_OTHER): Payer: Medicare Other | Admitting: Podiatry

## 2017-04-17 ENCOUNTER — Ambulatory Visit: Payer: Medicare Other

## 2017-04-17 ENCOUNTER — Encounter: Payer: Self-pay | Admitting: Podiatry

## 2017-04-17 DIAGNOSIS — L97402 Non-pressure chronic ulcer of unspecified heel and midfoot with fat layer exposed: Secondary | ICD-10-CM

## 2017-04-17 DIAGNOSIS — M14671 Charcot's joint, right ankle and foot: Secondary | ICD-10-CM

## 2017-04-17 DIAGNOSIS — E0842 Diabetes mellitus due to underlying condition with diabetic polyneuropathy: Secondary | ICD-10-CM

## 2017-04-19 NOTE — Progress Notes (Signed)
Subjective:    Patient ID: Katherine Herrera, female   DOB: 51 y.o.   MRN: [id number]   HPI patient presents stating that I have had his breakdown of tissue left that seems to be slowly improving and a my right foot I do have some collapse of the arch that I wanted to have checked    ROS      Objective:  Physical Exam neurovascular status unchanged from previous visit with patient found to have lesion formation plantar lateral aspect left fifth metatarsal that upon debridement shows continuous superficial opening but much better than previous with approximate 5 x 5 mm opening witha cutaneous exposure. On the right foot there is noted to be moderate collapse of the arch with a inverted foot structure     Assessment:    Ulceration left fifth metatarsal localized in nature with right foot showing moderate Charcot type deformity     Plan:    H&P condition reviewed debridement of lesions on the left was accomplished and flushed the area applied Iodosorb with sterile dressing and on the right foot reviewed x-rays and do not recommend treatment and less symptoms were to worsen  X-rays indicate there is moderate collapse medial longitudinal arch right

## 2017-04-23 DIAGNOSIS — R600 Localized edema: Secondary | ICD-10-CM | POA: Diagnosis not present

## 2017-04-23 DIAGNOSIS — M7989 Other specified soft tissue disorders: Secondary | ICD-10-CM | POA: Diagnosis not present

## 2017-04-23 DIAGNOSIS — N179 Acute kidney failure, unspecified: Secondary | ICD-10-CM | POA: Diagnosis not present

## 2017-04-23 DIAGNOSIS — E1165 Type 2 diabetes mellitus with hyperglycemia: Secondary | ICD-10-CM | POA: Diagnosis not present

## 2017-04-23 DIAGNOSIS — J449 Chronic obstructive pulmonary disease, unspecified: Secondary | ICD-10-CM | POA: Diagnosis not present

## 2017-04-23 DIAGNOSIS — E1142 Type 2 diabetes mellitus with diabetic polyneuropathy: Secondary | ICD-10-CM | POA: Diagnosis not present

## 2017-04-23 DIAGNOSIS — E11628 Type 2 diabetes mellitus with other skin complications: Secondary | ICD-10-CM | POA: Diagnosis not present

## 2017-04-23 DIAGNOSIS — R52 Pain, unspecified: Secondary | ICD-10-CM | POA: Diagnosis not present

## 2017-04-23 DIAGNOSIS — M79605 Pain in left leg: Secondary | ICD-10-CM | POA: Diagnosis not present

## 2017-04-23 DIAGNOSIS — M7730 Calcaneal spur, unspecified foot: Secondary | ICD-10-CM | POA: Diagnosis not present

## 2017-04-23 DIAGNOSIS — L03116 Cellulitis of left lower limb: Secondary | ICD-10-CM | POA: Diagnosis not present

## 2017-04-23 DIAGNOSIS — R739 Hyperglycemia, unspecified: Secondary | ICD-10-CM | POA: Diagnosis not present

## 2017-04-23 DIAGNOSIS — Z794 Long term (current) use of insulin: Secondary | ICD-10-CM | POA: Diagnosis not present

## 2017-04-23 DIAGNOSIS — E871 Hypo-osmolality and hyponatremia: Secondary | ICD-10-CM | POA: Diagnosis not present

## 2017-04-23 DIAGNOSIS — M79662 Pain in left lower leg: Secondary | ICD-10-CM | POA: Diagnosis not present

## 2017-04-23 DIAGNOSIS — E11610 Type 2 diabetes mellitus with diabetic neuropathic arthropathy: Secondary | ICD-10-CM | POA: Insufficient documentation

## 2017-04-23 DIAGNOSIS — E038 Other specified hypothyroidism: Secondary | ICD-10-CM | POA: Diagnosis not present

## 2017-04-23 DIAGNOSIS — R509 Fever, unspecified: Secondary | ICD-10-CM | POA: Diagnosis not present

## 2017-05-03 DIAGNOSIS — N179 Acute kidney failure, unspecified: Secondary | ICD-10-CM | POA: Insufficient documentation

## 2017-05-08 ENCOUNTER — Emergency Department (HOSPITAL_COMMUNITY)
Admission: EM | Admit: 2017-05-08 | Discharge: 2017-05-09 | Disposition: A | Discharge status: Home / Self Care | Payer: Medicare Other | Attending: Emergency Medicine | Admitting: Emergency Medicine

## 2017-05-08 DIAGNOSIS — E039 Hypothyroidism, unspecified: Secondary | ICD-10-CM | POA: Diagnosis not present

## 2017-05-08 DIAGNOSIS — J449 Chronic obstructive pulmonary disease, unspecified: Secondary | ICD-10-CM | POA: Insufficient documentation

## 2017-05-08 DIAGNOSIS — L03116 Cellulitis of left lower limb: Secondary | ICD-10-CM

## 2017-05-08 DIAGNOSIS — Z794 Long term (current) use of insulin: Secondary | ICD-10-CM | POA: Insufficient documentation

## 2017-05-08 DIAGNOSIS — F17200 Nicotine dependence, unspecified, uncomplicated: Secondary | ICD-10-CM | POA: Diagnosis not present

## 2017-05-08 DIAGNOSIS — F419 Anxiety disorder, unspecified: Secondary | ICD-10-CM | POA: Diagnosis not present

## 2017-05-08 DIAGNOSIS — M79662 Pain in left lower leg: Secondary | ICD-10-CM | POA: Diagnosis present

## 2017-05-08 DIAGNOSIS — Z79899 Other long term (current) drug therapy: Secondary | ICD-10-CM | POA: Diagnosis not present

## 2017-05-08 DIAGNOSIS — Z7982 Long term (current) use of aspirin: Secondary | ICD-10-CM | POA: Insufficient documentation

## 2017-05-08 DIAGNOSIS — E119 Type 2 diabetes mellitus without complications: Secondary | ICD-10-CM | POA: Diagnosis not present

## 2017-05-08 LAB — COMPREHENSIVE METABOLIC PANEL
ALT: 16 U/L (ref 14–54)
AST: 16 U/L (ref 15–41)
Albumin: 3.3 g/dL — ABNORMAL LOW (ref 3.5–5.0)
Alkaline Phosphatase: 108 U/L (ref 38–126)
Anion gap: 9 (ref 5–15)
BUN: 22 mg/dL — ABNORMAL HIGH (ref 6–20)
CO2: 26 mmol/L (ref 22–32)
Calcium: 9.2 mg/dL (ref 8.9–10.3)
Chloride: 102 mmol/L (ref 101–111)
Creatinine, Ser: 2.03 mg/dL — ABNORMAL HIGH (ref 0.44–1.00)
GFR calc Af Amer: 32 mL/min — ABNORMAL LOW (ref >60)
GFR calc non Af Amer: 27 mL/min — ABNORMAL LOW (ref >60)
Glucose, Bld: 143 mg/dL — ABNORMAL HIGH (ref 65–99)
Potassium: 5.6 mmol/L — ABNORMAL HIGH (ref 3.5–5.1)
Sodium: 137 mmol/L (ref 135–145)
Total Bilirubin: 0.8 mg/dL (ref 0.3–1.2)
Total Protein: 8.5 g/dL — ABNORMAL HIGH (ref 6.5–8.1)

## 2017-05-08 LAB — CBC WITH DIFFERENTIAL/PLATELET
Basophils Absolute: 0.1 10*3/uL (ref 0.0–0.1)
Basophils Relative: 1 %
Eosinophils Absolute: 0 10*3/uL (ref 0.0–0.7)
Eosinophils Relative: 0 %
HCT: 42.2 % (ref 36.0–46.0)
Hemoglobin: 13.6 g/dL (ref 12.0–15.0)
Lymphocytes Relative: 24 %
Lymphs Abs: 2.3 10*3/uL (ref 0.7–4.0)
MCH: 28 pg (ref 26.0–34.0)
MCHC: 32.2 g/dL (ref 30.0–36.0)
MCV: 87 fL (ref 78.0–100.0)
Monocytes Absolute: 0.5 10*3/uL (ref 0.1–1.0)
Monocytes Relative: 5 %
Neutro Abs: 7 10*3/uL (ref 1.7–7.7)
Neutrophils Relative %: 70 %
Platelets: 514 10*3/uL — ABNORMAL HIGH (ref 150–400)
RBC: 4.85 MIL/uL (ref 3.87–5.11)
RDW: 13.7 % (ref 11.5–15.5)
WBC: 9.9 10*3/uL (ref 4.0–10.5)

## 2017-05-08 LAB — URINALYSIS, ROUTINE W REFLEX MICROSCOPIC
Bacteria, UA: NONE SEEN
Bilirubin Urine: NEGATIVE
Glucose, UA: NEGATIVE mg/dL
Ketones, ur: 20 mg/dL — AB
Leukocytes, UA: NEGATIVE
Nitrite: NEGATIVE
Protein, ur: NEGATIVE mg/dL
Specific Gravity, Urine: 1.013 (ref 1.005–1.030)
pH: 6 (ref 5.0–8.0)

## 2017-05-08 LAB — CBG MONITORING, ED: Glucose-Capillary: 146 mg/dL — ABNORMAL HIGH (ref 65–99)

## 2017-05-08 LAB — I-STAT CG4 LACTIC ACID, ED: Lactic Acid, Venous: 1.04 mmol/L (ref 0.5–1.9)

## 2017-05-08 MED ORDER — ONDANSETRON 4 MG PO TBDP
8.0000 mg | ORAL_TABLET | Freq: Once | ORAL | Status: AC
  Administered 2017-05-09: 8 mg via ORAL
  Filled 2017-05-08: qty 2

## 2017-05-08 MED ORDER — ONDANSETRON 4 MG PO TBDP: 4.0000 mg | ORAL_TABLET | Freq: Once | ORAL | Status: DC | PRN

## 2017-05-08 MED ORDER — DOXYCYCLINE HYCLATE 100 MG PO TABS
100.0000 mg | ORAL_TABLET | Freq: Once | ORAL | Status: AC
  Administered 2017-05-09: 100 mg via ORAL
  Filled 2017-05-08: qty 1

## 2017-05-08 MED ORDER — ONDANSETRON 4 MG PO TBDP
ORAL_TABLET | ORAL | Status: AC
  Administered 2017-05-08: 4 mg
  Filled 2017-05-08: qty 1

## 2017-05-08 NOTE — ED Notes (Signed)
No answer vitals re-check.

## 2017-05-08 NOTE — ED Notes (Signed)
Pt in lobby states she did not hear her name being called, next to go to room

## 2017-05-08 NOTE — ED Notes (Signed)
Pt did not answer for vitals recheck 

## 2017-05-08 NOTE — ED Triage Notes (Signed)
Pt reports recently admitted to [facility] for left leg cellulitis. Discharged Friday. Leg worsened on Sunday. Now here with red, hot, swollen, tender lower extremity. Pt awake, alert, oriented x4, VSS.

## 2017-05-08 NOTE — ED Notes (Addendum)
Patient is in lobby vomiting. This RN looked up patient and found she was Discharge at [facility] as LWBS after Triage. Says she has been back and forth to bathroom and did not hear her name being called. Registration notified of need to be put back in system.

## 2017-05-08 NOTE — ED Provider Notes (Signed)
[facility] DEPT Provider Note   CSN: [id number] Arrival date & time: 05/08/17  1326     History   Chief Complaint Chief Complaint  Patient presents with  . Leg Pain    HPI Katherine Herrera is a 51 y.o. female.  Patient with past medical history of diabetes, cellulitis, peripheral neuropathy, and anxiety presents to the emergency department with chief complaint of cellulitis on her left lower extremity. She states that she was recently admitted at [facility] in [place] about 2 weeks ago. She stayed in the hospital for approximately 10 days. She was treated with Vancomycin and Zosyn, which was transitioned to Rocephin after a negative MRSA screen and the development of AKI.  She was discharged on doxycycline, but states that she has not been able to tolerate the antibiotic because of vomiting. She states that she was given Phenergan, but has not been helping. She believes that her symptoms are worsening. She reports subjective fevers and chills, but denies any measured temperature. She denies any other associated symptoms. There are no modifying factors.   The history is provided by the patient. No language interpreter was used.    Past Medical History:  Diagnosis Date  . Anxiety   . COPD (chronic obstructive pulmonary disease) ([place])   . DM (diabetes mellitus) ([place])   . Gout    possible  . Hyperlipidemia   . Hypothyroidism   . LBP (low back pain)    DR Patrice Paradise  . Migraine   . Peripheral neuropathy   . Syncope    poss conversion disorder vs psychogenic seizures  . Tachycardia    diet coke overuse?  Marland Kitchen Tenosynovitis of foot and ankle 09/03/2013  . TTS (tarsal tunnel syndrome) 2010   Left Foot [facility] in [place]  . Vitamin D deficiency     Patient Active Problem List   Diagnosis Date Noted  . Leg swelling 10/28/2016  . Knee contusion 01/13/2016  . Cellulitis and abscess of leg 07/24/2015  . Nausea without vomiting 07/24/2015  . Edema of right foot 06/19/2015  . DOE (dyspnea  on exertion) 06/16/2015  . Chest pain, atypical 06/16/2015  . Family history of musculoskeletal disease 03/12/2015  . Non-compliant behavior 12/24/2014  . Ulcer of right foot limited to breakdown of skin ([place]) 09/02/2014  . Ulcer of foot due to diabetes mellitus ([place]) 08/12/2014  . Ulcer of other part of foot 06/20/2014  . Leg pain, left 03/31/2014  . Callus of foot 03/25/2014  . 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 Rash 01/28/2014  . Pain in lower limb 01/24/2014  . Keratosis 01/24/2014  . Leg cramps 10/09/2013  . Vaginitis and vulvovaginitis 10/09/2013  . Ingrown nail 09/30/2013  . Pain in toe of left foot 09/30/2013  . Tenosynovitis of foot and ankle 09/03/2013  . Deformity of metatarsal bone of right foot 09/03/2013  . Pain in joint, ankle and foot 01/22/2013  . Pes cavus 10/13/2012  . Acquired tight Achilles tendon 10/13/2012  . Paronychia 06/14/2012  . 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 ([place]) 03/19/2012  . Allergic rhinitis 03/19/2012  . URI (upper respiratory infection) 08/30/2011  . Myalgia 04/18/2011  . SHOULDER PAIN 01/17/2011  . TTS (tarsal tunnel syndrome)   . GASTROENTERITIS 10/18/2010  . ARTHRALGIA 01/04/2010  . WEIGHT GAIN, ABNORMAL 01/04/2010  . TACHYCARDIA 10/02/2009  . CBC, ABNORMAL 05/01/2009  . B12 deficiency 01/05/2009  . GOUT 01/05/2009  . Edema 01/05/2009  . ANXIETY 07/28/2008  . ELBOW PAIN 07/28/2008  . TOBACCO USE  DISORDER/SMOKER-SMOKING CESSATION DISCUSSED 04/21/2008  . FOOT PAIN 04/21/2008  . PARESTHESIA 04/21/2008  . ABDOMINAL PAIN 04/21/2008  . SINUSITIS- ACUTE-NOS 11/13/2007  . COPD exacerbation ([place]) 11/13/2007  . Diabetic neuropathy ([place]) 08/27/2007  . LOW BACK PAIN 08/27/2007  . Dyslipidemia 08/20/2007  . Hypothyroidism 05/22/2007    Past Surgical History:  Procedure Laterality Date  . ABDOMINAL HYSTERECTOMY    . CARPAL TUNNEL RELEASE  2010   LEFT  . CERVICAL FUSION     C4-5    OB History    No data available        Home Medications    Prior to Admission medications   Medication Sig Start Date End Date Taking? Authorizing Provider  Albuterol Sulfate (PROAIR RESPICLICK) 176 (90 BASE) MCG/ACT AEPB Inhale 1-2 puffs into the lungs 4 (four) times daily as needed. 06/16/15  Yes Plotnikov, [name], MD  aspirin (BAYER ASPIRIN) 325 MG tablet Take 1 tablet (325 mg total) by mouth daily. 06/16/15  Yes Plotnikov, [name], MD  baclofen (LIORESAL) 10 MG tablet Take 2 tablets (20 mg total) by mouth 2 (two) times daily. 11/18/16  Yes Plotnikov, [name], MD  Cholecalciferol (VITAMIN D3) 1000 UNIT tablet Take 2,000 Units by mouth daily.     Yes [provider]  clonazePAM (KLONOPIN) 0.5 MG tablet TAKE ONE TABLET BY MOUTH 2 TIMES A DAY AS NEEDED FOR ANXIETY 02/17/17  Yes Plotnikov, [name], MD  clotrimazole-betamethasone (LOTRISONE) cream Apply 1 application topically 2 (two) times daily. 02/17/17  Yes Plotnikov, [name], MD  diclofenac sodium (VOLTAREN) 1 % GEL Apply 2 g topically 4 (four) times daily. 10/28/16  Yes Hoyt Koch, MD  ezetimibe (ZETIA) 10 MG tablet TAKE ONE TABLET BY MOUTH EVERY DAY 12/09/16  Yes Plotnikov, [name], MD  gabapentin (NEURONTIN) 800 MG tablet TAKE 1 TABLET FOUR TIMES  DAILY 12/27/16  Yes Renato Shin, MD  insulin glargine (LANTUS) 100 UNIT/ML injection Inject 14 Units into the skin at bedtime.   Yes [provider]  insulin regular (HUMULIN R) 250 units/2.71mL (100 units/mL) injection 65 units with breakfast, and 45 units with supper, and syringes 2/day Patient taking differently: Inject 14 Units into the skin 2 (two) times daily before a meal.  02/15/17  Yes Renato Shin, MD  lamoTRIgine (LAMICTAL) 25 MG tablet TAKE 1 TABLET BY MOUTH TWO  TIMES DAILY 03/19/17  Yes Plotnikov, [name], MD  levothyroxine (SYNTHROID, LEVOTHROID) 75 MCG tablet TAKE 1 TABLET BY MOUTH  DAILY 10/25/16  Yes Plotnikov, [name], MD  lidocaine (XYLOCAINE) 5 % ointment Apply 1  application topically as needed. 11/03/14  Yes Sheard, Myeong O, DPM  ondansetron (ZOFRAN) 4 MG tablet Take 1 tablet (4 mg total) by mouth every 8 (eight) hours as needed for nausea or vomiting. 07/24/15  Yes Plotnikov, [name], MD  oxyCODONE (ROXICODONE) 15 MG immediate release tablet Take 1 tablet (15 mg total) by mouth every 6 (six) hours as needed for pain. Please fill on or after 04/19/17. 02/17/17  Yes Plotnikov, [name], MD  rOPINIRole (REQUIP) 0.5 MG tablet TAKE ONE TABLET BY MOUTH AT BEDTIME 03/14/17  Yes Plotnikov, [name], MD  vitamin B-12 (CYANOCOBALAMIN) 1000 MCG tablet Take 1,000 mcg by mouth daily.     Yes [provider]  B-D INS SYR ULTRAFINE 1CC/31G 31G X 5/16" 1 ML MISC USE AS DIRECTED 09/08/16   Renato Shin, MD  glucose blood (ONETOUCH VERIO) test strip Use to check blood sugar 2 times per  day. 06/24/16   Renato Shin, MD  Insulin Syringe-Needle U-100 (B-D INS SYRINGE 0.5CC/31GX5/16) 31G X 5/16" 0.5 ML MISC USE AS DIRECTED WITH INSULIN 4 TIMES A DAY 07/01/16   Renato Shin, MD  Lancet Devices (ACCU-CHEK [facility]) lancets Test two times daily 07/01/16   Renato Shin, MD  [facility] DELICA LANCETS 28B [place] TEST TWO TIMES DAILY 03/15/17   Renato Shin, MD    Family History Family History  Problem Relation Age of Onset  . Diabetes Mother   . Hypertension Mother   . Cancer Father 75       ? bone cancer  . [name] Brother     Social History Social History  Substance Use Topics  . Smoking status: Current Every Day Smoker    Packs/day: 1.00    Last attempt to quit: 12/01/2009  . Smokeless tobacco: Never Used  . Alcohol use No     Allergies   Hydromorphone hcl; Cephalexin; Meperidine; Rosuvastatin calcium; Crestor [rosuvastatin calcium]; Lovastatin; Metformin; Niacin; Paroxetine; and Meperidine hcl   Review of Systems Review of Systems  All other systems reviewed and are negative.    Physical Exam Updated Vital Signs BP (!) 173/87 (BP Location:  Left Arm)   Pulse 70   Temp 98 F (36.7 C) (Oral)   Resp 20   SpO2 97%   Physical Exam  Constitutional: She is oriented to person, place, and time. She appears well-developed and well-nourished.  HENT:  Head: Normocephalic and atraumatic.  Eyes: Conjunctivae and EOM are normal. Pupils are equal, round, and reactive to light.  Neck: Normal range of motion. Neck supple.  Cardiovascular: Normal rate, regular rhythm and intact distal pulses.  Exam reveals no gallop and no friction rub.   No murmur heard. Pulmonary/Chest: Effort normal and breath sounds normal. No respiratory distress. She has no wheezes. She has no rales. She exhibits no tenderness.  Abdominal: Soft. Bowel sounds are normal. She exhibits no distension and no mass. There is no tenderness. There is no rebound and no guarding.  Musculoskeletal: Normal range of motion. She exhibits no edema or tenderness.  Neurological: She is alert and oriented to person, place, and time.  Skin: Skin is warm and dry.  Moderate erythema with vesicles on the LLE anteriorly  Psychiatric: She has a normal mood and affect. Her behavior is normal. Judgment and thought content normal.  Nursing note and vitals reviewed.    ED Treatments / Results  Labs (all labs ordered are listed, but only abnormal results are displayed) Labs Reviewed  COMPREHENSIVE METABOLIC PANEL - Abnormal; Notable for the following:       Result Value   Potassium 5.6 (*)    Glucose, Bld 143 (*)    BUN 22 (*)    Creatinine, Ser 2.03 (*)    Total Protein 8.5 (*)    Albumin 3.3 (*)    GFR calc non Af Amer 27 (*)    GFR calc Af Amer 32 (*)    All other components within normal limits  CBC WITH DIFFERENTIAL/PLATELET - Abnormal; Notable for the following:    Platelets 514 (*)    All other components within normal limits  URINALYSIS, ROUTINE W REFLEX MICROSCOPIC - Abnormal; Notable for the following:    Hgb urine dipstick SMALL (*)    Ketones, ur 20 (*)    Squamous  Epithelial / LPF 0-5 (*)    All other components within normal limits  CBG MONITORING, ED - Abnormal; Notable for the following:  Glucose-Capillary 146 (*)    All other components within normal limits  I-STAT CG4 LACTIC ACID, ED  I-STAT CG4 LACTIC ACID, ED    EKG  EKG Interpretation None       Radiology No results found.  Procedures Procedures (including critical care time)  Medications Ordered in ED Medications  ondansetron (ZOFRAN-ODT) disintegrating tablet 4 mg (not administered)  ondansetron (ZOFRAN-ODT) disintegrating tablet 8 mg (not administered)  ondansetron (ZOFRAN-ODT) 4 MG disintegrating tablet (4 mg  Given 05/08/17 2200)     Initial Impression / Assessment and Plan / ED Course  I have reviewed the triage vital signs and the nursing notes.  Pertinent labs & imaging results that were available during my care of the patient were reviewed by me and considered in my medical decision making (see chart for details).     Patient with cellulitis to LLE.  Recently discharged from [facility] in [place], but has not been able to tolerate PO antibiotics due to vomiting.  She is non-toxic appearing, does not appear to be septic or bacteremic.  I believe that she could still continue outpatient treatment if she is able to tolerate PO.  Will PO challenge after zofran.    Creatinine and potassium are trending down.  05/05/17 K was 6.2, Cr 2.65.  12:49 AM Patient tolerating POs with zofran.  Will advise discontinuing phenergan and using zofran.  Will also switch from doxy capsule to tablet.  Discussed with Dr. Randal Buba, who agrees with the plan.  Final Clinical Impressions(s) / ED Diagnoses   Final diagnoses:  Cellulitis of left lower extremity    New Prescriptions New Prescriptions   DOXYCYCLINE (VIBRA-TABS) 100 MG TABLET    Take 1 tablet (100 mg total) by mouth 2 (two) times daily.   ONDANSETRON (ZOFRAN ODT) 4 MG DISINTEGRATING TABLET    Take 1 tablet (4 mg total) by  mouth every 8 (eight) hours as needed for nausea or vomiting.     Montine Circle, PA-C 05/09/17 0050    Palumbo, April, MD 05/09/17 [phone]

## 2017-05-09 MED ORDER — ONDANSETRON 4 MG PO TBDP: 4.0000 mg | ORAL_TABLET | Freq: Three times a day (TID) | ORAL | 0 refills | Status: DC | PRN

## 2017-05-09 MED ORDER — DOXYCYCLINE HYCLATE 100 MG PO TABS: 100.0000 mg | ORAL_TABLET | Freq: Two times a day (BID) | ORAL | 0 refills | Status: DC

## 2017-05-09 NOTE — ED Notes (Signed)
Granddaughter called and wanted an [name] [phone])

## 2017-05-09 NOTE — ED Notes (Signed)
Granddaughter called and wanted an [name] [phone])

## 2017-05-10 ENCOUNTER — Ambulatory Visit (INDEPENDENT_AMBULATORY_CARE_PROVIDER_SITE_OTHER): Payer: Medicare Other | Admitting: Internal Medicine

## 2017-05-10 ENCOUNTER — Inpatient Hospital Stay: Payer: Medicare Other | Admitting: Internal Medicine

## 2017-05-10 ENCOUNTER — Other Ambulatory Visit (INDEPENDENT_AMBULATORY_CARE_PROVIDER_SITE_OTHER): Payer: Medicare Other

## 2017-05-10 DIAGNOSIS — E1140 Type 2 diabetes mellitus with diabetic neuropathy, unspecified: Secondary | ICD-10-CM | POA: Diagnosis not present

## 2017-05-10 DIAGNOSIS — M79605 Pain in left leg: Secondary | ICD-10-CM | POA: Diagnosis not present

## 2017-05-10 DIAGNOSIS — E0842 Diabetes mellitus due to underlying condition with diabetic polyneuropathy: Secondary | ICD-10-CM | POA: Diagnosis not present

## 2017-05-10 DIAGNOSIS — R110 Nausea: Secondary | ICD-10-CM | POA: Diagnosis not present

## 2017-05-10 DIAGNOSIS — E538 Deficiency of other specified B group vitamins: Secondary | ICD-10-CM | POA: Diagnosis not present

## 2017-05-10 DIAGNOSIS — L959 Vasculitis limited to the skin, unspecified: Secondary | ICD-10-CM | POA: Diagnosis not present

## 2017-05-10 DIAGNOSIS — R600 Localized edema: Secondary | ICD-10-CM | POA: Diagnosis not present

## 2017-05-10 LAB — SEDIMENTATION RATE: Sed Rate: 93 mm/hr — ABNORMAL HIGH (ref 0–30)

## 2017-05-10 LAB — CBC WITH DIFFERENTIAL/PLATELET
Basophils Absolute: 0.1 10*3/uL (ref 0.0–0.1)
Basophils Relative: 0.6 % (ref 0.0–3.0)
Eosinophils Absolute: 0.1 10*3/uL (ref 0.0–0.7)
Eosinophils Relative: 1.3 % (ref 0.0–5.0)
HCT: 40.5 % (ref 36.0–46.0)
Hemoglobin: 13.5 g/dL (ref 12.0–15.0)
Lymphocytes Relative: 31.2 % (ref 12.0–46.0)
Lymphs Abs: 3.3 10*3/uL (ref 0.7–4.0)
MCHC: 33.2 g/dL (ref 30.0–36.0)
MCV: 84.5 fl (ref 78.0–100.0)
Monocytes Absolute: 0.7 10*3/uL (ref 0.1–1.0)
Monocytes Relative: 6.7 % (ref 3.0–12.0)
Neutro Abs: 6.3 10*3/uL (ref 1.4–7.7)
Neutrophils Relative %: 60.2 % (ref 43.0–77.0)
Platelets: 546 10*3/uL — ABNORMAL HIGH (ref 150.0–400.0)
RBC: 4.8 Mil/uL (ref 3.87–5.11)
RDW: 14.2 % (ref 11.5–15.5)
WBC: 10.5 10*3/uL (ref 4.0–10.5)

## 2017-05-10 LAB — BASIC METABOLIC PANEL
BUN: 26 mg/dL — ABNORMAL HIGH (ref 6–23)
CO2: 29 mEq/L (ref 19–32)
Calcium: 9.6 mg/dL (ref 8.4–10.5)
Chloride: 98 mEq/L (ref 96–112)
Creatinine, Ser: 1.74 mg/dL — ABNORMAL HIGH (ref 0.40–1.20)
GFR: 32.73 mL/min — ABNORMAL LOW (ref >60.00)
Glucose, Bld: 137 mg/dL — ABNORMAL HIGH (ref 70–99)
Potassium: 4.4 mEq/L (ref 3.5–5.1)
Sodium: 133 mEq/L — ABNORMAL LOW (ref 135–145)

## 2017-05-10 LAB — HEPATIC FUNCTION PANEL
ALT: 8 U/L (ref 0–35)
AST: 9 U/L (ref 0–37)
Albumin: 3.8 g/dL (ref 3.5–5.2)
Alkaline Phosphatase: 94 U/L (ref 39–117)
Bilirubin, Direct: 0.1 mg/dL (ref 0.0–0.3)
Total Bilirubin: 0.5 mg/dL (ref 0.2–1.2)
Total Protein: 8.4 g/dL — ABNORMAL HIGH (ref 6.0–8.3)

## 2017-05-10 LAB — HEMOGLOBIN A1C: Hgb A1c MFr Bld: 8.5 % — ABNORMAL HIGH (ref 4.6–6.5)

## 2017-05-10 MED ORDER — ONDANSETRON 4 MG PO TBDP
4.0000 mg | ORAL_TABLET | Freq: Once | ORAL | Status: AC
  Administered 2017-05-10: 4 mg via ORAL

## 2017-05-10 NOTE — Assessment & Plan Note (Addendum)
Labs Humulin R and N

## 2017-05-10 NOTE — Assessment & Plan Note (Signed)
BMET 

## 2017-05-10 NOTE — Assessment & Plan Note (Signed)
Zofran IM Labs

## 2017-05-10 NOTE — Assessment & Plan Note (Signed)
On B12 

## 2017-05-10 NOTE — Patient Instructions (Signed)
Go to ER if worse 

## 2017-05-10 NOTE — Progress Notes (Signed)
Subjective:  Patient ID: Katherine Herrera, female    DOB: 02-Oct-1966  Age: 51 y.o. MRN: [id number]  CC: No chief complaint on file.   HPI GLORIAN MCDONELL presents for LLE pain, swelling and redness  Merie states that she was recently admitted at [facility] in [place] about 2 weeks ago for 2 weeks. She stayed in the hospital for approximately 10 days. She was treated with Vancomycin and Zosyn, which was transitioned to Rocephin after a negative MRSA screen and the development of AKI.  She was discharged on doxycycline, but states that she has not been able to tolerate the antibiotic because of vomiting. Still vomiting - last time this am. She states that she was given Phenergan, Zofran - not helping. She believes that her symptoms are worsening. She reports subjective fevers and chills, but denies any measured temperature.  Outpatient Medications Prior to Visit  Medication Sig Dispense Refill  . Albuterol Sulfate (PROAIR RESPICLICK) 409 (90 BASE) MCG/ACT AEPB Inhale 1-2 puffs into the lungs 4 (four) times daily as needed. 1 each 5  . aspirin (BAYER ASPIRIN) 325 MG tablet Take 1 tablet (325 mg total) by mouth daily. 100 tablet 3  . B-D INS SYR ULTRAFINE 1CC/31G 31G X 5/16" 1 ML MISC USE AS DIRECTED 100 each 0  . baclofen (LIORESAL) 10 MG tablet Take 2 tablets (20 mg total) by mouth 2 (two) times daily. 360 tablet 1  . Cholecalciferol (VITAMIN D3) 1000 UNIT tablet Take 2,000 Units by mouth daily.      . clonazePAM (KLONOPIN) 0.5 MG tablet TAKE ONE TABLET BY MOUTH 2 TIMES A DAY AS NEEDED FOR ANXIETY 60 tablet 2  . clotrimazole-betamethasone (LOTRISONE) cream Apply 1 application topically 2 (two) times daily. 45 g 1  . diclofenac sodium (VOLTAREN) 1 % GEL Apply 2 g topically 4 (four) times daily. 100 g 6  . doxycycline (VIBRA-TABS) 100 MG tablet Take 1 tablet (100 mg total) by mouth 2 (two) times daily. 14 tablet 0  . ezetimibe (ZETIA) 10 MG tablet TAKE ONE TABLET BY MOUTH EVERY DAY 30 tablet 5    . gabapentin (NEURONTIN) 800 MG tablet TAKE 1 TABLET FOUR TIMES  DAILY 360 tablet 1  . glucose blood (ONETOUCH VERIO) test strip Use to check blood sugar 2 times per day. 100 each 12  . insulin glargine (LANTUS) 100 UNIT/ML injection Inject 14 Units into the skin at bedtime.    . insulin regular (HUMULIN R) 250 units/2.71mL (100 units/mL) injection 65 units with breakfast, and 45 units with supper, and syringes 2/day (Patient taking differently: Inject 14 Units into the skin 2 (two) times daily before a meal. ) 40 mL 11  . Insulin Syringe-Needle U-100 (B-D INS SYRINGE 0.5CC/31GX5/16) 31G X 5/16" 0.5 ML MISC USE AS DIRECTED WITH INSULIN 4 TIMES A DAY 400 each 2  . lamoTRIgine (LAMICTAL) 25 MG tablet TAKE 1 TABLET BY MOUTH TWO  TIMES DAILY 180 tablet 1  . Lancet Devices (ACCU-CHEK SOFTCLIX) lancets Test two times daily 100 each 12  . levothyroxine (SYNTHROID, LEVOTHROID) 75 MCG tablet TAKE 1 TABLET BY MOUTH  DAILY 90 tablet 1  . lidocaine (XYLOCAINE) 5 % ointment Apply 1 application topically as needed. 35.44 g 0  . ondansetron (ZOFRAN ODT) 4 MG disintegrating tablet Take 1 tablet (4 mg total) by mouth every 8 (eight) hours as needed for nausea or vomiting. 10 tablet 0  . ondansetron (ZOFRAN) 4 MG tablet Take 1 tablet (4 mg total) by mouth every  8 (eight) hours as needed for nausea or vomiting. 20 tablet 0  . ONETOUCH DELICA LANCETS 95J MISC TEST TWO TIMES DAILY 200 each 2  . oxyCODONE (ROXICODONE) 15 MG immediate release tablet Take 1 tablet (15 mg total) by mouth every 6 (six) hours as needed for pain. Please fill on or after 04/19/17. 120 tablet 0  . rOPINIRole (REQUIP) 0.5 MG tablet TAKE ONE TABLET BY MOUTH AT BEDTIME 30 tablet 0  . vitamin B-12 (CYANOCOBALAMIN) 1000 MCG tablet Take 1,000 mcg by mouth daily.       No facility-administered medications prior to visit.     ROS Review of Systems  Constitutional: Positive for chills, fatigue and fever. Negative for activity change, appetite change  and unexpected weight change.  HENT: Negative for congestion, mouth sores and sinus pressure.   Eyes: Negative for visual disturbance.  Respiratory: Negative for cough and chest tightness.   Gastrointestinal: Positive for nausea and vomiting. Negative for abdominal pain.  Genitourinary: Negative for difficulty urinating, frequency and vaginal pain.  Musculoskeletal: Positive for arthralgias, back pain and gait problem.  Skin: Positive for color change, rash and wound. Negative for pallor.  Neurological: Negative for dizziness, tremors, weakness, numbness and headaches.  Psychiatric/Behavioral: Negative for confusion and sleep disturbance.    Objective:  There were no vitals taken for this visit.  BP Readings from Last 3 Encounters:  05/09/17 140/76  02/17/17 112/68  02/15/17 132/74    Wt Readings from Last 3 Encounters:  02/17/17 235 lb 0.6 oz (106.6 kg)  02/15/17 233 lb 12.8 oz (106.1 kg)  12/05/16 225 lb (102.1 kg)    Physical Exam  Constitutional: She appears well-developed. No distress.  HENT:  Head: Normocephalic.  Right Ear: External ear normal.  Left Ear: External ear normal.  Nose: Nose normal.  Mouth/Throat: Oropharynx is clear and moist.  Eyes: Conjunctivae are normal. Pupils are equal, round, and reactive to light. Right eye exhibits no discharge. Left eye exhibits no discharge.  Neck: Normal range of motion. Neck supple. No JVD present. No tracheal deviation present. No thyromegaly present.  Cardiovascular: Normal rate, regular rhythm and normal heart sounds.   Pulmonary/Chest: No stridor. No respiratory distress. She has no wheezes.  Abdominal: Soft. Bowel sounds are normal. She exhibits no distension and no mass. There is no tenderness. There is no rebound and no guarding.  Musculoskeletal: She exhibits tenderness. She exhibits no edema.  Lymphadenopathy:    She has no cervical adenopathy.  Neurological: She displays normal reflexes. No cranial nerve deficit.  She exhibits normal muscle tone. Coordination normal.  Skin: Rash noted. There is erythema.  Psychiatric: She has a normal mood and affect. Her behavior is normal. Judgment and thought content normal.  scabs, blistering over the area of her L shin erythema and with 1+ edema   Lab Results  Component Value Date   WBC 9.9 05/08/2017   HGB 13.6 05/08/2017   HCT 42.2 05/08/2017   PLT 514 (H) 05/08/2017   GLUCOSE 143 (H) 05/08/2017   CHOL 223 (H) 07/19/2016   TRIG (H) 07/19/2016    527.0 Triglyceride is over 400; calculations on Lipids are invalid.   HDL 27.40 (L) 07/19/2016   LDLDIRECT 106.0 07/19/2016   LDLCALC 81 01/02/2013   ALT 16 05/08/2017   AST 16 05/08/2017   NA 137 05/08/2017   K 5.6 (H) 05/08/2017   CL 102 05/08/2017   CREATININE 2.03 (H) 05/08/2017   BUN 22 (H) 05/08/2017   CO2 26 05/08/2017  TSH 1.16 07/19/2016   INR 0.95 01/28/2014   HGBA1C 9.8 02/15/2017   MICROALBUR 0.9 07/19/2016    No results found.  Assessment & Plan:   There are no diagnoses linked to this encounter. I am having Ms. Giannotti maintain her vitamin B-12, cholecalciferol, lidocaine, Albuterol Sulfate, aspirin, ondansetron, glucose blood, Insulin Syringe-Needle U-100, accu-chek softclix, B-D INS SYR ULTRAFINE 1CC/31G, levothyroxine, diclofenac sodium, baclofen, ezetimibe, gabapentin, insulin regular, clonazePAM, oxyCODONE, clotrimazole-betamethasone, rOPINIRole, lamoTRIgine, ONETOUCH DELICA LANCETS 89V, insulin glargine, doxycycline, and ondansetron.  No orders of the defined types were placed in this encounter.    Follow-up: No Follow-up on file.  Walker Kehr, MD

## 2017-05-10 NOTE — Assessment & Plan Note (Addendum)
[facility] in [place]  Labs - D-dimer Ven [place] Xarelto samples

## 2017-05-10 NOTE — Assessment & Plan Note (Addendum)
?  relapse vs other Will ref to [facility] in [place]

## 2017-05-11 LAB — D-DIMER, QUANTITATIVE: D-Dimer, Quant: 0.91 mcg/mL FEU — ABNORMAL HIGH (ref <0.50)

## 2017-05-12 ENCOUNTER — Ambulatory Visit (HOSPITAL_COMMUNITY)
Admission: RE | Admit: 2017-05-12 | Discharge: 2017-05-12 | Disposition: A | Discharge status: Home / Self Care | Payer: Medicare Other | Source: Ambulatory Visit | Attending: Internal Medicine | Admitting: Internal Medicine

## 2017-05-12 ENCOUNTER — Telehealth: Payer: Self-pay | Admitting: Internal Medicine

## 2017-05-12 DIAGNOSIS — R600 Localized edema: Secondary | ICD-10-CM | POA: Diagnosis not present

## 2017-05-12 DIAGNOSIS — M79605 Pain in left leg: Secondary | ICD-10-CM | POA: Diagnosis not present

## 2017-05-12 NOTE — Telephone Encounter (Signed)
Received preliminary report form [facility] vascular for [name]. She is negative for DVT. Gave result to [employer]. Pt was not to start Suralto--per Plot.

## 2017-05-16 ENCOUNTER — Other Ambulatory Visit: Payer: Medicare Other | Admitting: Orthotics

## 2017-05-16 DIAGNOSIS — L03116 Cellulitis of left lower limb: Secondary | ICD-10-CM | POA: Diagnosis not present

## 2017-05-16 DIAGNOSIS — R21 Rash and other nonspecific skin eruption: Secondary | ICD-10-CM | POA: Diagnosis not present

## 2017-05-17 ENCOUNTER — Ambulatory Visit: Payer: Medicare Other | Admitting: Endocrinology

## 2017-05-18 NOTE — Progress Notes (Signed)
Pre visit review using our clinic review tool, if applicable. No additional management support is needed unless otherwise documented below in the visit note. 

## 2017-05-18 NOTE — Progress Notes (Signed)
Subjective:   Katherine Herrera is a 51 y.o. female who presents for an Initial Medicare Annual Wellness Visit.  Review of Systems    No ROS.  Medicare Wellness Visit. Additional risk factors are reflected in the social history.   Cardiac Risk Factors include: diabetes mellitus;dyslipidemia;sedentary lifestyle Sleep patterns: has daytime sleepiness, gets up 1 times nightly to void and sleeps 6-7 hours nightly.    Home Safety/Smoke Alarms: Feels safe in home. Smoke alarms in place.  Living environment; residence and Firearm Safety: 1-story house/ trailer, no firearms. Lives with family, no needs for DME, good support system  Seat Belt Safety/Bike Helmet: Wears seat belt.   Counseling:   Eye Exam- appointment yearly Dental- appointment every 6 months  Female:   Pap- N/A, hysterectomy     Mammo- Last 01/15/15, normal, patient states she will make an appointment with Dr. Radene Knee who orders her mammograms     Dexa scan- N/D      CCS- Last 11/18/10, polyp, no recall data     Objective:    Today's Vitals   05/19/17 1518 05/19/17 1611  BP: 118/70   Pulse: 96   Temp: (!) 97.5 F (36.4 C)   TempSrc: Oral   SpO2: 97%   Weight: 216 lb (98 kg)   Height: 5' 9.5" (1.765 m)   PainSc:  8    Body mass index is 31.44 kg/m.   Current Medications (verified) Outpatient Encounter Prescriptions as of 05/19/2017  Medication Sig  . Albuterol Sulfate (PROAIR RESPICLICK) 341 (90 BASE) MCG/ACT AEPB Inhale 1-2 puffs into the lungs 4 (four) times daily as needed.  Marland Kitchen aspirin (BAYER ASPIRIN) 325 MG tablet Take 1 tablet (325 mg total) by mouth daily.  . B-D INS SYR ULTRAFINE 1CC/31G 31G X 5/16" 1 ML MISC USE AS DIRECTED  . baclofen (LIORESAL) 10 MG tablet Take 2 tablets (20 mg total) by mouth 2 (two) times daily.  . Cholecalciferol (VITAMIN D3) 1000 UNIT tablet Take 2,000 Units by mouth daily.    . clonazePAM (KLONOPIN) 0.5 MG tablet TAKE ONE TABLET BY MOUTH 2 TIMES A DAY AS NEEDED FOR ANXIETY  .  clotrimazole-betamethasone (LOTRISONE) cream Apply 1 application topically 2 (two) times daily.  . diclofenac sodium (VOLTAREN) 1 % GEL Apply 2 g topically 4 (four) times daily.  Marland Kitchen ezetimibe (ZETIA) 10 MG tablet TAKE ONE TABLET BY MOUTH EVERY DAY  . gabapentin (NEURONTIN) 800 MG tablet TAKE 1 TABLET FOUR TIMES  DAILY  . glucose blood (ONETOUCH VERIO) test strip Use to check blood sugar 2 times per day.  . insulin glargine (LANTUS) 100 UNIT/ML injection Inject 14 Units into the skin at bedtime.  . insulin regular (HUMULIN R) 250 units/2.25mL (100 units/mL) injection 65 units with breakfast, and 45 units with supper, and syringes 2/day (Patient taking differently: Inject 14 Units into the skin 2 (two) times daily before a meal. )  . Insulin Syringe-Needle U-100 (B-D INS SYRINGE 0.5CC/31GX5/16) 31G X 5/16" 0.5 ML MISC USE AS DIRECTED WITH INSULIN 4 TIMES A DAY  . lamoTRIgine (LAMICTAL) 25 MG tablet TAKE 1 TABLET BY MOUTH TWO  TIMES DAILY  . Lancet Devices (ACCU-CHEK SOFTCLIX) lancets Test two times daily  . levothyroxine (SYNTHROID, LEVOTHROID) 75 MCG tablet TAKE 1 TABLET BY MOUTH  DAILY  . lidocaine (XYLOCAINE) 5 % ointment Apply 1 application topically as needed.  . ondansetron (ZOFRAN ODT) 4 MG disintegrating tablet Take 1 tablet (4 mg total) by mouth every 8 (eight) hours as needed  for nausea or vomiting.  . ondansetron (ZOFRAN) 4 MG tablet Take 1 tablet (4 mg total) by mouth every 8 (eight) hours as needed for nausea or vomiting.  Glory Rosebush DELICA LANCETS 63O MISC TEST TWO TIMES DAILY  . oxyCODONE (ROXICODONE) 15 MG immediate release tablet Take 1 tablet (15 mg total) by mouth every 6 (six) hours as needed for pain. Please fill on or after 06/19/17.  Marland Kitchen rOPINIRole (REQUIP) 0.5 MG tablet TAKE ONE TABLET BY MOUTH AT BEDTIME  . vitamin B-12 (CYANOCOBALAMIN) 1000 MCG tablet Take 1,000 mcg by mouth daily.    . [DISCONTINUED] doxycycline (VIBRA-TABS) 100 MG tablet Take 1 tablet (100 mg total) by mouth 2  (two) times daily.  . [DISCONTINUED] ondansetron (ZOFRAN ODT) 4 MG disintegrating tablet Take 1 tablet (4 mg total) by mouth every 8 (eight) hours as needed for nausea or vomiting.  . [DISCONTINUED] oxyCODONE (ROXICODONE) 15 MG immediate release tablet Take 1 tablet (15 mg total) by mouth every 6 (six) hours as needed for pain. Please fill on or after 04/19/17.  . [DISCONTINUED] oxyCODONE (ROXICODONE) 15 MG immediate release tablet Take 1 tablet (15 mg total) by mouth every 6 (six) hours as needed for pain. Please fill on or after 05/19/17.   No facility-administered encounter medications on file as of 05/19/2017.     Allergies (verified) Hydromorphone hcl; Cephalexin; Meperidine; Rosuvastatin calcium; Crestor [rosuvastatin calcium]; Lovastatin; Metformin; Niacin; Paroxetine; and Meperidine hcl   History: Past Medical History:  Diagnosis Date  . Anxiety   . COPD (chronic obstructive pulmonary disease) ([place])   . DM (diabetes mellitus) ([employer])   . Gout    possible  . Hyperlipidemia   . Hypothyroidism   . LBP (low back pain)    DR Patrice Paradise  . Migraine   . Peripheral neuropathy   . Syncope    poss conversion disorder vs psychogenic seizures  . Tachycardia    diet coke overuse?  Marland Kitchen Tenosynovitis of foot and ankle 09/03/2013  . TTS (tarsal tunnel syndrome) 2010   Left Foot [facility] in [place]  . Vitamin D deficiency    Past Surgical History:  Procedure Laterality Date  . ABDOMINAL HYSTERECTOMY    . CARPAL TUNNEL RELEASE  2010   LEFT  . CERVICAL FUSION     C4-5   Family History  Problem Relation Age of Onset  . Diabetes Mother   . Hypertension Mother   . Cancer Father 57       ? bone cancer  . [name] Brother    Social History   Occupational History  . Not on file.   Social History Main Topics  . Smoking status: Current Every Day Smoker    Packs/day: 1.00    Last attempt to quit: 12/01/2009  . Smokeless tobacco: Never Used  . Alcohol use No  . Drug use: No  . Sexual  activity: Yes    Tobacco Counseling Ready to quit: Not Answered Counseling given: Not Answered   Activities of Daily Living In your present state of health, do you have any difficulty performing the following activities: 05/19/2017  Hearing? N  Vision? N  Difficulty concentrating or making decisions? N  Walking or climbing stairs? Y  Dressing or bathing? N  Doing errands, shopping? Y  Preparing Food and eating ? Y  Using the Toilet? N  In the past six months, have you accidently leaked urine? N  Do you have problems with loss of bowel control? N  Managing your Medications?  N  Managing your Finances? N  Housekeeping or managing your Housekeeping? Y  Some recent data might be hidden    Immunizations and Health Maintenance Immunization History  Administered Date(s) Administered  . DTaP 06/15/2011  . Influenza,inj,Quad PF,36+ Mos 08/12/2015, 07/19/2016  . Pneumococcal Conjugate-13 02/17/2017  . Td 10/31/2008   Health Maintenance Due  Topic Date Due  . PNEUMOCOCCAL POLYSACCHARIDE VACCINE (1) 11/29/1967  . PAP SMEAR  11/28/1986  . COLONOSCOPY  11/29/2015  . MAMMOGRAM  01/14/2017    Patient Care Team: Plotnikov, [name], MD as PCP - General  Indicate any recent Medical Services you may have received from other than [facility] providers in the past year (date may be approximate).     Assessment:   This is a routine wellness examination for Christie.Physical assessment deferred to PCP.   Hearing/Vision screen Hearing Screening Comments: Able to hear conversational tones w/o difficulty. No issues reported.  Passed whisper test Vision Screening Comments: Wears glasses  Dietary issues and exercise activities discussed: Current Exercise Habits: The patient does not participate in regular exercise at present (chair exercise pamphlet provided), Exercise limited by: Other - see comments (BLE ulcerations and pain)  Diet (meal preparation, eat out, water intake, caffeinated  beverages, dairy products, fruits and vegetables): in general, a "healthy" diet     Reviewed heart healthy and diabetic diet, encouraged patient to increase daily water intake. Patient states she does not need printed diet education.  Goals    . Stay as healthy as possible          Stay as active as I can, eat healthy, enjoy family.      Depression Screen PHQ 2/9 Scores 05/19/2017 02/17/2017  PHQ - 2 Score 2 0  PHQ- 9 Score 6 -    Fall Risk Fall Risk  05/19/2017 02/17/2017  Falls in the past year? No No    Cognitive Function:       Ad8 score reviewed for issues:  Issues making decisions: no  Less interest in hobbies / activities: no  Repeats questions, stories (family complaining): no  Trouble using ordinary gadgets (microwave, computer, phone):no  Forgets the month or year: no  Mismanaging finances: no  Remembering appts: no  Daily problems with thinking and/or memory: no Ad8 score is= 0  Screening Tests Health Maintenance  Topic Date Due  . PNEUMOCOCCAL POLYSACCHARIDE VACCINE (1) 11/29/1967  . PAP SMEAR  11/28/1986  . COLONOSCOPY  11/29/2015  . MAMMOGRAM  01/14/2017  . INFLUENZA VACCINE  05/31/2017  . URINE MICROALBUMIN  07/19/2017  . OPHTHALMOLOGY EXAM  11/01/2017  . HEMOGLOBIN A1C  11/10/2017  . FOOT EXAM  02/15/2018  . TETANUS/TDAP  10/31/2018  . HIV Screening  Completed      Plan:    Continue doing brain stimulating activities (puzzles, reading, adult coloring books, staying active) to keep memory sharp.   Continue to eat heart healthy diet (full of fruits, vegetables, whole grains, lean protein, water--limit salt, fat, and sugar intake) and increase physical activity as tolerated.   I have personally reviewed and noted the following in the patient's chart:   . Medical and social history . Use of alcohol, tobacco or illicit drugs  . Current medications and supplements . Functional ability and status . Nutritional status . Physical  activity . Advanced directives . List of other physicians . Vitals . Screenings to include cognitive, depression, and falls . Referrals and appointments  In addition, I have reviewed and discussed with patient certain preventive  protocols, quality metrics, and best practice recommendations. A written personalized care plan for preventive services as well as general preventive health recommendations were provided to patient.     Michiel Cowboy, RN   05/19/2017

## 2017-05-19 ENCOUNTER — Ambulatory Visit (INDEPENDENT_AMBULATORY_CARE_PROVIDER_SITE_OTHER): Payer: Medicare Other | Admitting: Internal Medicine

## 2017-05-19 VITALS — BP 118/70 | HR 96 | Temp 97.5°F | Ht 69.5 in | Wt 216.0 lb

## 2017-05-19 DIAGNOSIS — L959 Vasculitis limited to the skin, unspecified: Secondary | ICD-10-CM | POA: Diagnosis not present

## 2017-05-19 DIAGNOSIS — R600 Localized edema: Secondary | ICD-10-CM | POA: Diagnosis not present

## 2017-05-19 DIAGNOSIS — Z0000 Encounter for general adult medical examination without abnormal findings: Secondary | ICD-10-CM

## 2017-05-19 DIAGNOSIS — L989 Disorder of the skin and subcutaneous tissue, unspecified: Secondary | ICD-10-CM | POA: Diagnosis not present

## 2017-05-19 DIAGNOSIS — R110 Nausea: Secondary | ICD-10-CM | POA: Diagnosis not present

## 2017-05-19 MED ORDER — OXYCODONE HCL 15 MG PO TABS: 15.0000 mg | ORAL_TABLET | Freq: Four times a day (QID) | ORAL | 0 refills | Status: DC | PRN

## 2017-05-19 MED ORDER — ONDANSETRON 4 MG PO TBDP: 4.0000 mg | ORAL_TABLET | Freq: Three times a day (TID) | ORAL | 0 refills | Status: DC | PRN

## 2017-05-19 NOTE — Assessment & Plan Note (Signed)
Bridgid did see a dermatologist and had a skin bx. She is on Sulfa now.

## 2017-05-19 NOTE — Progress Notes (Signed)
Subjective:  Patient ID: Katherine Herrera, female    DOB: [birth year]  Age: 51 y.o. MRN: [id number]  CC: No chief complaint on file.   HPI Katherine Herrera presents for LLE skin rash: she did see a dermatologist and had a skin bx. She is on Sulfa now. Nausea is better a little Swelling is better  Outpatient Medications Prior to Visit  Medication Sig Dispense Refill  . Albuterol Sulfate (PROAIR RESPICLICK) 761 (90 BASE) MCG/ACT AEPB Inhale 1-2 puffs into the lungs 4 (four) times daily as needed. 1 each 5  . aspirin (BAYER ASPIRIN) 325 MG tablet Take 1 tablet (325 mg total) by mouth daily. 100 tablet 3  . B-D INS SYR ULTRAFINE 1CC/31G 31G X 5/16" 1 ML MISC USE AS DIRECTED 100 each 0  . baclofen (LIORESAL) 10 MG tablet Take 2 tablets (20 mg total) by mouth 2 (two) times daily. 360 tablet 1  . Cholecalciferol (VITAMIN D3) 1000 UNIT tablet Take 2,000 Units by mouth daily.      . clonazePAM (KLONOPIN) 0.5 MG tablet TAKE ONE TABLET BY MOUTH 2 TIMES A DAY AS NEEDED FOR ANXIETY 60 tablet 2  . clotrimazole-betamethasone (LOTRISONE) cream Apply 1 application topically 2 (two) times daily. 45 g 1  . diclofenac sodium (VOLTAREN) 1 % GEL Apply 2 g topically 4 (four) times daily. 100 g 6  . doxycycline (VIBRA-TABS) 100 MG tablet Take 1 tablet (100 mg total) by mouth 2 (two) times daily. 14 tablet 0  . ezetimibe (ZETIA) 10 MG tablet TAKE ONE TABLET BY MOUTH EVERY DAY 30 tablet 5  . gabapentin (NEURONTIN) 800 MG tablet TAKE 1 TABLET FOUR TIMES  DAILY 360 tablet 1  . glucose blood (ONETOUCH VERIO) test strip Use to check blood sugar 2 times per day. 100 each 12  . insulin glargine (LANTUS) 100 UNIT/ML injection Inject 14 Units into the skin at bedtime.    . insulin regular (HUMULIN R) 250 units/2.73mL (100 units/mL) injection 65 units with breakfast, and 45 units with supper, and syringes 2/day (Patient taking differently: Inject 14 Units into the skin 2 (two) times daily before a meal. ) 40 mL 11  . Insulin  Syringe-Needle U-100 (B-D INS SYRINGE 0.5CC/31GX5/16) 31G X 5/16" 0.5 ML MISC USE AS DIRECTED WITH INSULIN 4 TIMES A DAY 400 each 2  . lamoTRIgine (LAMICTAL) 25 MG tablet TAKE 1 TABLET BY MOUTH TWO  TIMES DAILY 180 tablet 1  . Lancet Devices (ACCU-CHEK SOFTCLIX) lancets Test two times daily 100 each 12  . levothyroxine (SYNTHROID, LEVOTHROID) 75 MCG tablet TAKE 1 TABLET BY MOUTH  DAILY 90 tablet 1  . lidocaine (XYLOCAINE) 5 % ointment Apply 1 application topically as needed. 35.44 g 0  . ondansetron (ZOFRAN ODT) 4 MG disintegrating tablet Take 1 tablet (4 mg total) by mouth every 8 (eight) hours as needed for nausea or vomiting. 10 tablet 0  . ondansetron (ZOFRAN) 4 MG tablet Take 1 tablet (4 mg total) by mouth every 8 (eight) hours as needed for nausea or vomiting. 20 tablet 0  . ONETOUCH DELICA LANCETS 60V MISC TEST TWO TIMES DAILY 200 each 2  . oxyCODONE (ROXICODONE) 15 MG immediate release tablet Take 1 tablet (15 mg total) by mouth every 6 (six) hours as needed for pain. Please fill on or after 04/19/17. 120 tablet 0  . rOPINIRole (REQUIP) 0.5 MG tablet TAKE ONE TABLET BY MOUTH AT BEDTIME 30 tablet 0  . vitamin B-12 (CYANOCOBALAMIN) 1000 MCG tablet Take 1,000  mcg by mouth daily.       No facility-administered medications prior to visit.     ROS Review of Systems  Constitutional: Positive for fatigue and unexpected weight change. Negative for activity change, appetite change and chills.  HENT: Negative for congestion, mouth sores and sinus pressure.   Eyes: Negative for visual disturbance.  Respiratory: Negative for cough and chest tightness.   Gastrointestinal: Positive for nausea. Negative for abdominal pain.  Genitourinary: Negative for difficulty urinating, frequency and vaginal pain.  Musculoskeletal: Positive for arthralgias, back pain and gait problem.  Skin: Positive for color change, rash and wound. Negative for pallor.  Neurological: Negative for dizziness, tremors, weakness,  numbness and headaches.  Psychiatric/Behavioral: Negative for confusion and sleep disturbance.    Objective:  BP 118/70 (BP Location: Left Arm, Patient Position: Sitting, Cuff Size: Large)   Pulse 96   Temp (!) 97.5 F (36.4 C) (Oral)   Ht 5' 9.5" (1.765 m)   Wt 216 lb (98 kg)   SpO2 97%   BMI 31.44 kg/m   BP Readings from Last 3 Encounters:  05/19/17 118/70  05/09/17 140/76  02/17/17 112/68    Wt Readings from Last 3 Encounters:  05/19/17 216 lb (98 kg)  02/17/17 235 lb 0.6 oz (106.6 kg)  02/15/17 233 lb 12.8 oz (106.1 kg)    Physical Exam  Constitutional: She appears well-developed. No distress.  HENT:  Head: Normocephalic.  Right Ear: External ear normal.  Left Ear: External ear normal.  Nose: Nose normal.  Mouth/Throat: Oropharynx is clear and moist.  Eyes: Pupils are equal, round, and reactive to light. Conjunctivae are normal. Right eye exhibits no discharge. Left eye exhibits no discharge.  Neck: Normal range of motion. Neck supple. No JVD present. No tracheal deviation present. No thyromegaly present.  Cardiovascular: Normal rate, regular rhythm and normal heart sounds.   Pulmonary/Chest: No stridor. No respiratory distress. She has no wheezes.  Abdominal: Soft. Bowel sounds are normal. She exhibits no distension and no mass. There is no tenderness. There is no rebound and no guarding.  Musculoskeletal: She exhibits no edema or tenderness.  Lymphadenopathy:    She has no cervical adenopathy.  Neurological: She displays normal reflexes. No cranial nerve deficit. She exhibits normal muscle tone. Coordination normal.  Skin: No rash noted. No erythema.  Psychiatric: She has a normal mood and affect. Her behavior is normal. Judgment and thought content normal.  LLE is dressed R lat foot w/eryth swelling laterally  Lab Results  Component Value Date   WBC 10.5 05/10/2017   HGB 13.5 05/10/2017   HCT 40.5 05/10/2017   PLT 546.0 (H) 05/10/2017   GLUCOSE 137 (H)  05/10/2017   CHOL 223 (H) 07/19/2016   TRIG (H) 07/19/2016    527.0 Triglyceride is over 400; calculations on Lipids are invalid.   HDL 27.40 (L) 07/19/2016   LDLDIRECT 106.0 07/19/2016   LDLCALC 81 01/02/2013   ALT 8 05/10/2017   AST 9 05/10/2017   NA 133 (L) 05/10/2017   K 4.4 05/10/2017   CL 98 05/10/2017   CREATININE 1.74 (H) 05/10/2017   BUN 26 (H) 05/10/2017   CO2 29 05/10/2017   TSH 1.16 07/19/2016   INR 0.95 01/28/2014   HGBA1C 8.5 (H) 05/10/2017   MICROALBUR 0.9 07/19/2016    No results found.  Assessment & Plan:   There are no diagnoses linked to this encounter. I am having Ms. Chio maintain her vitamin B-12, cholecalciferol, lidocaine, Albuterol Sulfate, aspirin, ondansetron,  glucose blood, Insulin Syringe-Needle U-100, accu-chek softclix, B-D INS SYR ULTRAFINE 1CC/31G, levothyroxine, diclofenac sodium, baclofen, ezetimibe, gabapentin, insulin regular, clonazePAM, oxyCODONE, clotrimazole-betamethasone, rOPINIRole, lamoTRIgine, ONETOUCH DELICA LANCETS 38I, insulin glargine, doxycycline, and ondansetron.  No orders of the defined types were placed in this encounter.    Follow-up: No Follow-up on file.  Walker Kehr, MD

## 2017-05-19 NOTE — Assessment & Plan Note (Signed)
Better  

## 2017-05-19 NOTE — Patient Instructions (Signed)
Continue doing brain stimulating activities (puzzles, reading, adult coloring books, staying active) to keep memory sharp.   Continue to eat heart healthy diet (full of fruits, vegetables, whole grains, lean protein, water--limit salt, fat, and sugar intake) and increase physical activity as tolerated.   Katherine Herrera , Thank you for taking time to come for your Medicare Wellness Visit. I appreciate your ongoing commitment to your health goals. Please review the following plan we discussed and let me know if I can assist you in the future.   These are the goals we discussed: Goals    . Stay as healthy as possible          Stay as active as I can, eat healthy, enjoy family.       This is a list of the screening recommended for you and due dates:  Health Maintenance  Topic Date Due  . Pneumococcal vaccine (1) 11/29/1967  . Pap Smear  11/28/1986  . Colon Cancer Screening  11/29/2015  . Mammogram  01/14/2017  . Flu Shot  05/31/2017  . Urine Protein Check  07/19/2017  . Eye exam for diabetics  11/01/2017  . Hemoglobin A1C  11/10/2017  . Complete foot exam   02/15/2018  . Tetanus Vaccine  10/31/2018  . HIV Screening  Completed   Insomnia Insomnia is a sleep disorder that makes it difficult to fall asleep or to stay asleep. Insomnia can cause tiredness (fatigue), low energy, difficulty concentrating, mood swings, and poor performance at work or school. There are three different ways to classify insomnia:  Difficulty falling asleep.  Difficulty staying asleep.  Waking up too early in the morning.  Any type of insomnia can be long-term (chronic) or short-term (acute). Both are common. Short-term insomnia usually lasts for three months or less. Chronic insomnia occurs at least three times a week for longer than three months. What are the causes? Insomnia may be caused by another condition, situation, or substance, such as:  Anxiety.  Certain medicines.  Gastroesophageal reflux  disease (GERD) or other gastrointestinal conditions.  Asthma or other breathing conditions.  Restless legs syndrome, sleep apnea, or other sleep disorders.  Chronic pain.  Menopause. This may include hot flashes.  Stroke.  Abuse of alcohol, tobacco, or illegal drugs.  Depression.  Caffeine.  Neurological disorders, such as Alzheimer disease.  An overactive thyroid (hyperthyroidism).  The cause of insomnia may not be known. What increases the risk? Risk factors for insomnia include:  Gender. Women are more commonly affected than men.  Age. Insomnia is more common as you get older.  Stress. This may involve your professional or personal life.  Income. Insomnia is more common in people with lower income.  Lack of exercise.  Irregular work schedule or night shifts.  Traveling between different time zones.  What are the signs or symptoms? If you have insomnia, trouble falling asleep or trouble staying asleep is the main symptom. This may lead to other symptoms, such as:  Feeling fatigued.  Feeling nervous about going to sleep.  Not feeling rested in the morning.  Having trouble concentrating.  Feeling irritable, anxious, or depressed.  How is this treated? Treatment for insomnia depends on the cause. If your insomnia is caused by an underlying condition, treatment will focus on addressing the condition. Treatment may also include:  Medicines to help you sleep.  Counseling or therapy.  Lifestyle adjustments.  Follow these instructions at home:  Take medicines only as directed by your health care provider.  Keep regular sleeping and waking hours. Avoid naps.  Keep a sleep diary to help you and your health care provider figure out what could be causing your insomnia. Include: ? When you sleep. ? When you wake up during the night. ? How well you sleep. ? How rested you feel the next day. ? Any side effects of medicines you are taking. ? What you eat  and drink.  Make your bedroom a comfortable place where it is easy to fall asleep: ? Put up shades or special blackout curtains to block light from outside. ? Use a white noise machine to block noise. ? Keep the temperature cool.  Exercise regularly as directed by your health care provider. Avoid exercising right before bedtime.  Use relaxation techniques to manage stress. Ask your health care provider to suggest some techniques that may work well for you. These may include: ? Breathing exercises. ? Routines to release muscle tension. ? Visualizing peaceful scenes.  Cut back on alcohol, caffeinated beverages, and cigarettes, especially close to bedtime. These can disrupt your sleep.  Do not overeat or eat spicy foods right before bedtime. This can lead to digestive discomfort that can make it hard for you to sleep.  Limit screen use before bedtime. This includes: ? Watching TV. ? Using your smartphone, tablet, and computer.  Stick to a routine. This can help you fall asleep faster. Try to do a quiet activity, brush your teeth, and go to bed at the same time each night.  Get out of bed if you are still awake after 15 minutes of trying to sleep. Keep the lights down, but try reading or doing a quiet activity. When you feel sleepy, go back to bed.  Make sure that you drive carefully. Avoid driving if you feel very sleepy.  Keep all follow-up appointments as directed by your health care provider. This is important. Contact a health care provider if:  You are tired throughout the day or have trouble in your daily routine due to sleepiness.  You continue to have sleep problems or your sleep problems get worse. Get help right away if:  You have serious thoughts about hurting yourself or someone else. This information is not intended to replace advice given to you by your health care provider. Make sure you discuss any questions you have with your health care provider. Document  Released: 10/14/2000 Document Revised: 03/18/2016 Document Reviewed: 07/18/2014 Elsevier Interactive Patient Education  [employer].

## 2017-05-19 NOTE — Assessment & Plan Note (Signed)
Zofran ODT prn Better

## 2017-05-21 DIAGNOSIS — E1142 Type 2 diabetes mellitus with diabetic polyneuropathy: Secondary | ICD-10-CM | POA: Diagnosis not present

## 2017-05-21 DIAGNOSIS — E871 Hypo-osmolality and hyponatremia: Secondary | ICD-10-CM | POA: Diagnosis not present

## 2017-05-21 DIAGNOSIS — E038 Other specified hypothyroidism: Secondary | ICD-10-CM | POA: Diagnosis not present

## 2017-05-21 DIAGNOSIS — T402X5A Adverse effect of other opioids, initial encounter: Secondary | ICD-10-CM | POA: Diagnosis not present

## 2017-05-21 DIAGNOSIS — R1032 Left lower quadrant pain: Secondary | ICD-10-CM | POA: Diagnosis not present

## 2017-05-21 DIAGNOSIS — R748 Abnormal levels of other serum enzymes: Secondary | ICD-10-CM | POA: Diagnosis not present

## 2017-05-21 DIAGNOSIS — K859 Acute pancreatitis without necrosis or infection, unspecified: Secondary | ICD-10-CM | POA: Diagnosis not present

## 2017-05-21 DIAGNOSIS — R1031 Right lower quadrant pain: Secondary | ICD-10-CM | POA: Diagnosis not present

## 2017-05-21 DIAGNOSIS — R160 Hepatomegaly, not elsewhere classified: Secondary | ICD-10-CM | POA: Diagnosis not present

## 2017-05-21 DIAGNOSIS — Z881 Allergy status to other antibiotic agents status: Secondary | ICD-10-CM | POA: Diagnosis not present

## 2017-05-21 DIAGNOSIS — E11610 Type 2 diabetes mellitus with diabetic neuropathic arthropathy: Secondary | ICD-10-CM | POA: Diagnosis not present

## 2017-05-21 DIAGNOSIS — K5730 Diverticulosis of large intestine without perforation or abscess without bleeding: Secondary | ICD-10-CM | POA: Diagnosis not present

## 2017-05-21 DIAGNOSIS — R030 Elevated blood-pressure reading, without diagnosis of hypertension: Secondary | ICD-10-CM | POA: Diagnosis not present

## 2017-05-21 DIAGNOSIS — Z79891 Long term (current) use of opiate analgesic: Secondary | ICD-10-CM | POA: Diagnosis not present

## 2017-05-21 DIAGNOSIS — R1030 Lower abdominal pain, unspecified: Secondary | ICD-10-CM | POA: Diagnosis not present

## 2017-05-21 DIAGNOSIS — Z7982 Long term (current) use of aspirin: Secondary | ICD-10-CM | POA: Diagnosis not present

## 2017-05-21 DIAGNOSIS — R1084 Generalized abdominal pain: Secondary | ICD-10-CM | POA: Diagnosis not present

## 2017-05-21 DIAGNOSIS — Z888 Allergy status to other drugs, medicaments and biological substances status: Secondary | ICD-10-CM | POA: Diagnosis not present

## 2017-05-21 DIAGNOSIS — J449 Chronic obstructive pulmonary disease, unspecified: Secondary | ICD-10-CM | POA: Diagnosis not present

## 2017-05-21 DIAGNOSIS — Z79899 Other long term (current) drug therapy: Secondary | ICD-10-CM | POA: Diagnosis not present

## 2017-05-21 DIAGNOSIS — K8020 Calculus of gallbladder without cholecystitis without obstruction: Secondary | ICD-10-CM | POA: Diagnosis not present

## 2017-05-21 DIAGNOSIS — K5903 Drug induced constipation: Secondary | ICD-10-CM | POA: Diagnosis not present

## 2017-05-21 DIAGNOSIS — R7989 Other specified abnormal findings of blood chemistry: Secondary | ICD-10-CM | POA: Diagnosis not present

## 2017-05-21 DIAGNOSIS — R945 Abnormal results of liver function studies: Secondary | ICD-10-CM | POA: Diagnosis not present

## 2017-05-21 DIAGNOSIS — Z794 Long term (current) use of insulin: Secondary | ICD-10-CM | POA: Diagnosis not present

## 2017-05-21 DIAGNOSIS — K5900 Constipation, unspecified: Secondary | ICD-10-CM | POA: Diagnosis not present

## 2017-05-21 DIAGNOSIS — Z885 Allergy status to narcotic agent status: Secondary | ICD-10-CM | POA: Diagnosis not present

## 2017-05-22 ENCOUNTER — Ambulatory Visit: Payer: Medicare Other | Admitting: Endocrinology

## 2017-05-22 DIAGNOSIS — Z0289 Encounter for other administrative examinations: Secondary | ICD-10-CM

## 2017-05-22 DIAGNOSIS — K8020 Calculus of gallbladder without cholecystitis without obstruction: Secondary | ICD-10-CM | POA: Diagnosis not present

## 2017-05-22 DIAGNOSIS — E11610 Type 2 diabetes mellitus with diabetic neuropathic arthropathy: Secondary | ICD-10-CM | POA: Diagnosis not present

## 2017-05-22 DIAGNOSIS — J449 Chronic obstructive pulmonary disease, unspecified: Secondary | ICD-10-CM | POA: Diagnosis not present

## 2017-05-22 DIAGNOSIS — R109 Unspecified abdominal pain: Secondary | ICD-10-CM | POA: Diagnosis not present

## 2017-05-22 DIAGNOSIS — R7989 Other specified abnormal findings of blood chemistry: Secondary | ICD-10-CM | POA: Diagnosis not present

## 2017-05-22 DIAGNOSIS — K859 Acute pancreatitis without necrosis or infection, unspecified: Secondary | ICD-10-CM | POA: Diagnosis not present

## 2017-05-22 DIAGNOSIS — K5903 Drug induced constipation: Secondary | ICD-10-CM | POA: Diagnosis not present

## 2017-05-22 DIAGNOSIS — R748 Abnormal levels of other serum enzymes: Secondary | ICD-10-CM | POA: Insufficient documentation

## 2017-05-23 DIAGNOSIS — R109 Unspecified abdominal pain: Secondary | ICD-10-CM | POA: Diagnosis not present

## 2017-05-23 DIAGNOSIS — N179 Acute kidney failure, unspecified: Secondary | ICD-10-CM | POA: Diagnosis not present

## 2017-05-23 DIAGNOSIS — K859 Acute pancreatitis without necrosis or infection, unspecified: Secondary | ICD-10-CM | POA: Diagnosis not present

## 2017-05-23 DIAGNOSIS — K5903 Drug induced constipation: Secondary | ICD-10-CM | POA: Diagnosis not present

## 2017-05-23 DIAGNOSIS — R748 Abnormal levels of other serum enzymes: Secondary | ICD-10-CM | POA: Diagnosis not present

## 2017-05-28 DIAGNOSIS — D171 Benign lipomatous neoplasm of skin and subcutaneous tissue of trunk: Secondary | ICD-10-CM | POA: Diagnosis not present

## 2017-05-28 DIAGNOSIS — I4510 Unspecified right bundle-branch block: Secondary | ICD-10-CM | POA: Diagnosis not present

## 2017-05-28 DIAGNOSIS — Z79899 Other long term (current) drug therapy: Secondary | ICD-10-CM | POA: Diagnosis not present

## 2017-05-28 DIAGNOSIS — R748 Abnormal levels of other serum enzymes: Secondary | ICD-10-CM | POA: Diagnosis not present

## 2017-05-28 DIAGNOSIS — J449 Chronic obstructive pulmonary disease, unspecified: Secondary | ICD-10-CM | POA: Diagnosis not present

## 2017-05-28 DIAGNOSIS — R1013 Epigastric pain: Secondary | ICD-10-CM | POA: Diagnosis not present

## 2017-05-28 DIAGNOSIS — E1165 Type 2 diabetes mellitus with hyperglycemia: Secondary | ICD-10-CM | POA: Diagnosis not present

## 2017-05-28 DIAGNOSIS — R071 Chest pain on breathing: Secondary | ICD-10-CM | POA: Diagnosis not present

## 2017-05-28 DIAGNOSIS — E039 Hypothyroidism, unspecified: Secondary | ICD-10-CM | POA: Diagnosis not present

## 2017-05-28 DIAGNOSIS — Z7982 Long term (current) use of aspirin: Secondary | ICD-10-CM | POA: Diagnosis not present

## 2017-05-28 DIAGNOSIS — R739 Hyperglycemia, unspecified: Secondary | ICD-10-CM | POA: Diagnosis not present

## 2017-05-28 DIAGNOSIS — R109 Unspecified abdominal pain: Secondary | ICD-10-CM | POA: Diagnosis not present

## 2017-05-28 DIAGNOSIS — G894 Chronic pain syndrome: Secondary | ICD-10-CM | POA: Diagnosis not present

## 2017-05-28 DIAGNOSIS — R7989 Other specified abnormal findings of blood chemistry: Secondary | ICD-10-CM | POA: Diagnosis not present

## 2017-05-28 DIAGNOSIS — K5900 Constipation, unspecified: Secondary | ICD-10-CM | POA: Diagnosis not present

## 2017-05-28 DIAGNOSIS — E785 Hyperlipidemia, unspecified: Secondary | ICD-10-CM | POA: Diagnosis not present

## 2017-05-28 DIAGNOSIS — K5903 Drug induced constipation: Secondary | ICD-10-CM | POA: Diagnosis not present

## 2017-05-28 DIAGNOSIS — M546 Pain in thoracic spine: Secondary | ICD-10-CM | POA: Diagnosis not present

## 2017-05-28 DIAGNOSIS — R9431 Abnormal electrocardiogram [ECG] [EKG]: Secondary | ICD-10-CM | POA: Diagnosis not present

## 2017-05-28 DIAGNOSIS — Z794 Long term (current) use of insulin: Secondary | ICD-10-CM | POA: Diagnosis not present

## 2017-05-28 DIAGNOSIS — K760 Fatty (change of) liver, not elsewhere classified: Secondary | ICD-10-CM | POA: Diagnosis not present

## 2017-05-28 DIAGNOSIS — R079 Chest pain, unspecified: Secondary | ICD-10-CM | POA: Diagnosis not present

## 2017-05-28 DIAGNOSIS — K8500 Idiopathic acute pancreatitis without necrosis or infection: Secondary | ICD-10-CM | POA: Diagnosis not present

## 2017-05-28 DIAGNOSIS — R21 Rash and other nonspecific skin eruption: Secondary | ICD-10-CM | POA: Diagnosis not present

## 2017-05-28 DIAGNOSIS — K859 Acute pancreatitis without necrosis or infection, unspecified: Secondary | ICD-10-CM | POA: Diagnosis not present

## 2017-05-29 DIAGNOSIS — E1165 Type 2 diabetes mellitus with hyperglycemia: Secondary | ICD-10-CM | POA: Diagnosis not present

## 2017-05-29 DIAGNOSIS — K5903 Drug induced constipation: Secondary | ICD-10-CM | POA: Diagnosis not present

## 2017-05-29 DIAGNOSIS — E785 Hyperlipidemia, unspecified: Secondary | ICD-10-CM | POA: Diagnosis not present

## 2017-05-29 DIAGNOSIS — R1013 Epigastric pain: Secondary | ICD-10-CM | POA: Diagnosis not present

## 2017-05-30 DIAGNOSIS — E1165 Type 2 diabetes mellitus with hyperglycemia: Secondary | ICD-10-CM | POA: Diagnosis not present

## 2017-05-30 DIAGNOSIS — E785 Hyperlipidemia, unspecified: Secondary | ICD-10-CM | POA: Diagnosis not present

## 2017-05-30 DIAGNOSIS — R1013 Epigastric pain: Secondary | ICD-10-CM | POA: Diagnosis not present

## 2017-05-30 DIAGNOSIS — K5903 Drug induced constipation: Secondary | ICD-10-CM | POA: Diagnosis not present

## 2017-05-31 ENCOUNTER — Ambulatory Visit: Payer: Medicare Other | Admitting: Podiatry

## 2017-06-06 ENCOUNTER — Other Ambulatory Visit: Payer: Self-pay | Admitting: Internal Medicine

## 2017-06-10 DIAGNOSIS — M25571 Pain in right ankle and joints of right foot: Secondary | ICD-10-CM | POA: Diagnosis not present

## 2017-06-10 DIAGNOSIS — Z79899 Other long term (current) drug therapy: Secondary | ICD-10-CM | POA: Diagnosis not present

## 2017-06-10 DIAGNOSIS — Z885 Allergy status to narcotic agent status: Secondary | ICD-10-CM | POA: Diagnosis not present

## 2017-06-10 DIAGNOSIS — Z720 Tobacco use: Secondary | ICD-10-CM | POA: Diagnosis not present

## 2017-06-10 DIAGNOSIS — Z888 Allergy status to other drugs, medicaments and biological substances status: Secondary | ICD-10-CM | POA: Diagnosis not present

## 2017-06-10 DIAGNOSIS — Z882 Allergy status to sulfonamides status: Secondary | ICD-10-CM | POA: Diagnosis not present

## 2017-06-10 DIAGNOSIS — L03115 Cellulitis of right lower limb: Secondary | ICD-10-CM | POA: Diagnosis not present

## 2017-06-10 DIAGNOSIS — E1140 Type 2 diabetes mellitus with diabetic neuropathy, unspecified: Secondary | ICD-10-CM | POA: Diagnosis not present

## 2017-06-10 DIAGNOSIS — M7731 Calcaneal spur, right foot: Secondary | ICD-10-CM | POA: Diagnosis not present

## 2017-06-10 DIAGNOSIS — Z7982 Long term (current) use of aspirin: Secondary | ICD-10-CM | POA: Diagnosis not present

## 2017-06-10 DIAGNOSIS — J449 Chronic obstructive pulmonary disease, unspecified: Secondary | ICD-10-CM | POA: Diagnosis not present

## 2017-06-10 DIAGNOSIS — M79671 Pain in right foot: Secondary | ICD-10-CM | POA: Diagnosis not present

## 2017-06-10 DIAGNOSIS — Z794 Long term (current) use of insulin: Secondary | ICD-10-CM | POA: Diagnosis not present

## 2017-06-10 DIAGNOSIS — E0590 Thyrotoxicosis, unspecified without thyrotoxic crisis or storm: Secondary | ICD-10-CM | POA: Diagnosis not present

## 2017-06-10 DIAGNOSIS — E11628 Type 2 diabetes mellitus with other skin complications: Secondary | ICD-10-CM | POA: Diagnosis not present

## 2017-06-10 DIAGNOSIS — M7989 Other specified soft tissue disorders: Secondary | ICD-10-CM | POA: Diagnosis not present

## 2017-06-12 ENCOUNTER — Ambulatory Visit (INDEPENDENT_AMBULATORY_CARE_PROVIDER_SITE_OTHER): Payer: Medicare Other | Admitting: Internal Medicine

## 2017-06-12 ENCOUNTER — Encounter: Payer: Self-pay | Admitting: Internal Medicine

## 2017-06-12 DIAGNOSIS — M7989 Other specified soft tissue disorders: Secondary | ICD-10-CM

## 2017-06-12 DIAGNOSIS — E0842 Diabetes mellitus due to underlying condition with diabetic polyneuropathy: Secondary | ICD-10-CM | POA: Diagnosis not present

## 2017-06-12 DIAGNOSIS — E1140 Type 2 diabetes mellitus with diabetic neuropathy, unspecified: Secondary | ICD-10-CM

## 2017-06-12 MED ORDER — EZETIMIBE 10 MG PO TABS: 10.0000 mg | ORAL_TABLET | Freq: Every day | ORAL | 11 refills | Status: DC

## 2017-06-12 MED ORDER — OXYCODONE HCL 15 MG PO TABS: 15.0000 mg | ORAL_TABLET | Freq: Four times a day (QID) | ORAL | 0 refills | Status: DC | PRN

## 2017-06-12 NOTE — Assessment & Plan Note (Signed)
Humulin R and N 

## 2017-06-12 NOTE — Assessment & Plan Note (Signed)
No change 

## 2017-06-12 NOTE — Assessment & Plan Note (Addendum)
LLE cellulitis ER visit on 05/08/17 - given PO Doxy. Podiartry (Dr Paulla Dolly) appt on Wednesday: R foot and shin are swollen w/erythema. Abscess-like formation on the R lat foot covered w/fibrin, tender  R>L. Cont Doxy Doppler (-)

## 2017-06-12 NOTE — Progress Notes (Signed)
Subjective:  Patient ID: Katherine Herrera, female    DOB: 1966-01-31  Age: 51 y.o. MRN: [id number]  CC: No chief complaint on file.   HPI Katherine Herrera presents for LLE cellulitis ER visit on 05/08/17 - given PO Doxy. Podiartry (Dr Paulla Dolly) appt on Wednesday:  Per ER:  "Patient with past medical history of diabetes, cellulitis, peripheral neuropathy, and anxiety presents to the emergency department with chief complaint of cellulitis on her left lower extremity. She states that she was recently admitted at [facility] in [place] about 2 weeks ago. She stayed in the hospital for approximately 10 days. She was treated with Vancomycin and Zosyn, which was transitioned to Rocephin after a negative MRSA screen and the development of AKI.  She was discharged on doxycycline, but states that she has not been able to tolerate the antibiotic because of vomiting. She states that she was given Phenergan, but has not been helping. She believes that her symptoms are worsening. She reports subjective fevers and chills, but denies any measured temperature"   Outpatient Medications Prior to Visit  Medication Sig Dispense Refill  . Albuterol Sulfate (PROAIR RESPICLICK) 250 (90 BASE) MCG/ACT AEPB Inhale 1-2 puffs into the lungs 4 (four) times daily as needed. 1 each 5  . aspirin (BAYER ASPIRIN) 325 MG tablet Take 1 tablet (325 mg total) by mouth daily. 100 tablet 3  . B-D INS SYR ULTRAFINE 1CC/31G 31G X 5/16" 1 ML MISC USE AS DIRECTED 100 each 0  . baclofen (LIORESAL) 10 MG tablet Take 2 tablets (20 mg total) by mouth 2 (two) times daily. 360 tablet 1  . BISACODYL 5 MG EC tablet TAKE TWO TABLETS BY MOUTH EVERY DAY 30 tablet 5  . Cholecalciferol (VITAMIN D3) 1000 UNIT tablet Take 2,000 Units by mouth daily.      . clonazePAM (KLONOPIN) 0.5 MG tablet TAKE ONE TABLET BY MOUTH 2 TIMES A DAY AS NEEDED FOR ANXIETY 60 tablet 2  . clotrimazole-betamethasone (LOTRISONE) cream Apply 1 application topically 2 (two) times  daily. 45 g 1  . diclofenac sodium (VOLTAREN) 1 % GEL Apply 2 g topically 4 (four) times daily. 100 g 6  . ezetimibe (ZETIA) 10 MG tablet TAKE ONE TABLET BY MOUTH EVERY DAY 30 tablet 5  . gabapentin (NEURONTIN) 800 MG tablet TAKE 1 TABLET FOUR TIMES  DAILY 360 tablet 1  . glucose blood (ONETOUCH VERIO) test strip Use to check blood sugar 2 times per day. 100 each 12  . insulin glargine (LANTUS) 100 UNIT/ML injection Inject 14 Units into the skin at bedtime.    . insulin regular (HUMULIN R) 250 units/2.45mL (100 units/mL) injection 65 units with breakfast, and 45 units with supper, and syringes 2/day (Patient taking differently: Inject 14 Units into the skin 2 (two) times daily before a meal. ) 40 mL 11  . Insulin Syringe-Needle U-100 (B-D INS SYRINGE 0.5CC/31GX5/16) 31G X 5/16" 0.5 ML MISC USE AS DIRECTED WITH INSULIN 4 TIMES A DAY 400 each 2  . lamoTRIgine (LAMICTAL) 25 MG tablet TAKE 1 TABLET BY MOUTH TWO  TIMES DAILY 180 tablet 1  . Lancet Devices (ACCU-CHEK SOFTCLIX) lancets Test two times daily 100 each 12  . levothyroxine (SYNTHROID, LEVOTHROID) 75 MCG tablet TAKE 1 TABLET BY MOUTH  DAILY 90 tablet 1  . lidocaine (XYLOCAINE) 5 % ointment Apply 1 application topically as needed. 35.44 g 0  . ondansetron (ZOFRAN ODT) 4 MG disintegrating tablet Take 1 tablet (4 mg total) by mouth every 8 (  eight) hours as needed for nausea or vomiting. 21 tablet 0  . ondansetron (ZOFRAN) 4 MG tablet Take 1 tablet (4 mg total) by mouth every 8 (eight) hours as needed for nausea or vomiting. 20 tablet 0  . ONETOUCH DELICA LANCETS 14E MISC TEST TWO TIMES DAILY 200 each 2  . oxyCODONE (ROXICODONE) 15 MG immediate release tablet Take 1 tablet (15 mg total) by mouth every 6 (six) hours as needed for pain. Please fill on or after 06/19/17. 120 tablet 0  . polyethylene glycol powder (GLYCOLAX/MIRALAX) powder TAKE 17 GRAMS BY MOUTH EVERY 12 HOURS AS NEEDED 500 g 5  . rOPINIRole (REQUIP) 0.5 MG tablet TAKE ONE TABLET BY  MOUTH AT BEDTIME 30 tablet 0  . vitamin B-12 (CYANOCOBALAMIN) 1000 MCG tablet Take 1,000 mcg by mouth daily.       No facility-administered medications prior to visit.     ROS Review of Systems  Constitutional: Negative for activity change, appetite change, chills, fatigue and unexpected weight change.  HENT: Negative for congestion, mouth sores and sinus pressure.   Eyes: Negative for visual disturbance.  Respiratory: Negative for cough and chest tightness.   Gastrointestinal: Negative for abdominal pain and nausea.  Genitourinary: Negative for difficulty urinating, frequency and vaginal pain.  Musculoskeletal: Positive for arthralgias, back pain and gait problem.  Skin: Positive for color change, rash and wound. Negative for pallor.  Neurological: Negative for dizziness, tremors, weakness, numbness and headaches.  Psychiatric/Behavioral: Negative for confusion and sleep disturbance.    Objective:  BP 120/72 (BP Location: Left Arm, Patient Position: Sitting, Cuff Size: Normal)   Pulse 75   Temp 98.4 F (36.9 C) (Oral)   Ht 5' 9.5" (1.765 m)   Wt 213 lb (96.6 kg)   SpO2 99%   BMI 31.00 kg/m   BP Readings from Last 3 Encounters:  06/12/17 120/72  05/19/17 118/70  05/09/17 140/76    Wt Readings from Last 3 Encounters:  06/12/17 213 lb (96.6 kg)  05/19/17 216 lb (98 kg)  02/17/17 235 lb 0.6 oz (106.6 kg)    Physical Exam  Constitutional: She appears well-developed. No distress.  HENT:  Head: Normocephalic.  Right Ear: External ear normal.  Left Ear: External ear normal.  Nose: Nose normal.  Mouth/Throat: Oropharynx is clear and moist.  Eyes: Pupils are equal, round, and reactive to light. Conjunctivae are normal. Right eye exhibits no discharge. Left eye exhibits no discharge.  Neck: Normal range of motion. Neck supple. No JVD present. No tracheal deviation present. No thyromegaly present.  Cardiovascular: Normal rate, regular rhythm and normal heart sounds.     Pulmonary/Chest: No stridor. No respiratory distress. She has no wheezes.  Abdominal: Soft. Bowel sounds are normal. She exhibits no distension and no mass. There is no tenderness. There is no rebound and no guarding.  Musculoskeletal: She exhibits edema and tenderness.  Lymphadenopathy:    She has no cervical adenopathy.  Neurological: She displays normal reflexes. No cranial nerve deficit. She exhibits normal muscle tone. Coordination normal.  Skin: No rash noted. There is erythema.  Psychiatric: She has a normal mood and affect. Her behavior is normal. Judgment and thought content normal.  R foot and shin are swollen w/erythema. Abscess-like formation on the R lat foot covered w/fibrin, tender LLE - better  Lab Results  Component Value Date   WBC 10.5 05/10/2017   HGB 13.5 05/10/2017   HCT 40.5 05/10/2017   PLT 546.0 (H) 05/10/2017   GLUCOSE 137 (H) 05/10/2017  CHOL 223 (H) 07/19/2016   TRIG (H) 07/19/2016    527.0 Triglyceride is over 400; calculations on Lipids are invalid.   HDL 27.40 (L) 07/19/2016   LDLDIRECT 106.0 07/19/2016   LDLCALC 81 01/02/2013   ALT 8 05/10/2017   AST 9 05/10/2017   NA 133 (L) 05/10/2017   K 4.4 05/10/2017   CL 98 05/10/2017   CREATININE 1.74 (H) 05/10/2017   BUN 26 (H) 05/10/2017   CO2 29 05/10/2017   TSH 1.16 07/19/2016   INR 0.95 01/28/2014   HGBA1C 8.5 (H) 05/10/2017   MICROALBUR 0.9 07/19/2016    No results found.  Assessment & Plan:   There are no diagnoses linked to this encounter. I am having Ms. Mulka maintain her vitamin B-12, cholecalciferol, lidocaine, Albuterol Sulfate, aspirin, ondansetron, glucose blood, Insulin Syringe-Needle U-100, accu-chek softclix, B-D INS SYR ULTRAFINE 1CC/31G, levothyroxine, diclofenac sodium, baclofen, ezetimibe, gabapentin, insulin regular, clonazePAM, clotrimazole-betamethasone, rOPINIRole, lamoTRIgine, ONETOUCH DELICA LANCETS 10C, insulin glargine, ondansetron, oxyCODONE, polyethylene glycol  powder, bisacodyl, and doxycycline.  Meds ordered this encounter  Medications  . doxycycline (MONODOX) 100 MG capsule    Sig: Take by mouth.  . DISCONTD: ondansetron (ZOFRAN-ODT) 4 MG disintegrating tablet    Sig: Take by mouth.     Follow-up: No Follow-up on file.  Walker Kehr, MD

## 2017-06-14 ENCOUNTER — Encounter: Payer: Self-pay | Admitting: Podiatry

## 2017-06-14 ENCOUNTER — Ambulatory Visit (INDEPENDENT_AMBULATORY_CARE_PROVIDER_SITE_OTHER): Payer: Medicare Other | Admitting: Podiatry

## 2017-06-14 ENCOUNTER — Telehealth: Payer: Self-pay | Admitting: Internal Medicine

## 2017-06-14 DIAGNOSIS — L97402 Non-pressure chronic ulcer of unspecified heel and midfoot with fat layer exposed: Secondary | ICD-10-CM

## 2017-06-15 NOTE — Progress Notes (Signed)
Subjective:    Patient ID: Katherine Herrera, female   DOB: 51 y.o.   MRN: [id number]   HPI patient states she's been in the hospital with apparent cellulitic-like symptoms which have occurred in the left than the right foot and she did have reaction to that and had Lilia Pro compromise especially pancreas and has recovered from this but continues to have redness in the right foot and is currently on doxycycline which seems to be helping    ROS      Objective:  Physical Exam neurovascular unchanged with keratotic lesion fifth metatarsal left measuring approximately 8 x 8 mm with 2 mm of depth and on the right fifth metatarsal base enlargement with discoloration with no proximal edema erythema drainage currently that measures approximately 1.5 cm x 1.5 cm with only 1 mm of probe with no indication of bone infection and patient states she's had x-rays done at the hospital on Sunday which were negative     Assessment:    Ulceration with probably cellulitic event even though patient is not good historian     Plan:    Sharp debridement of ulcerations bilateral accomplished with sterile dressing application and Silvadene application. Strict instructions that if any redness should occur drainage to go straight to the hospital and if not I'm going to have Dr. March Rummage take a look at her few weeks and I do think ultimately she will benefit from a fifth metatarsal head resection left and possible bone shaving of the base of fifth metatarsal right with previous fracture that had occurred and did not heal properly

## 2017-06-19 ENCOUNTER — Telehealth: Payer: Self-pay | Admitting: Endocrinology

## 2017-06-19 ENCOUNTER — Other Ambulatory Visit: Payer: Self-pay

## 2017-06-19 ENCOUNTER — Other Ambulatory Visit: Payer: Self-pay | Admitting: Internal Medicine

## 2017-06-19 MED ORDER — INSULIN GLARGINE 100 UNIT/ML ~~LOC~~ SOLN: SUBCUTANEOUS | 3 refills | Status: DC

## 2017-06-19 NOTE — Telephone Encounter (Signed)
MEDICATION: insulin glargine (LANTUS) 100 UNIT/ML injection  PHARMACY:  [place], [address] [phone] (Phone) [phone] (Fax)   IS THIS A 90 DAY SUPPLY : same as last time  IS PATIENT OUT OF MEDICTAION: yes  IF NOT; HOW MUCH IS LEFT: n/a  LAST APPOINTMENT DATE:02/15/17  NEXT APPOINTMENT DATE:n/a  OTHER COMMENTS: Dr. Loanne Drilling has not filled before, patient has been out for 5 days.   **Let patient know to contact pharmacy at the end of the day to make sure medication is ready. **  ** Please notify patient to allow 48-72 hours to process**  **Encourage patient to contact the pharmacy for refills or they can request refills through [facility]**

## 2017-06-19 NOTE — Telephone Encounter (Signed)
Pharmacy called stating they believe the instructions on this script Is wrong,  The pt states Plot told her to go up to 20 units, since it was precribed as 14 units the insurance will not pay for it and they need a new script of 20 units for the insurance to pay for it. Please advise and call back, ask for Katherine Herrera at pharmacy

## 2017-06-19 NOTE — Telephone Encounter (Signed)
Pls advise on msg below .Did you advise pt to increase insulin to 20 units at last ov did not see in notes...Johny Chess

## 2017-06-19 NOTE — Telephone Encounter (Signed)
Routing to you °

## 2017-06-19 NOTE — Telephone Encounter (Signed)
Ok 20 units Lantus is correct Thx

## 2017-06-20 ENCOUNTER — Other Ambulatory Visit: Payer: Self-pay

## 2017-06-20 MED ORDER — INSULIN GLARGINE 100 UNIT/ML ~~LOC~~ SOLN: 20.0000 [IU] | Freq: Every day | SUBCUTANEOUS | 3 refills | Status: DC

## 2017-06-20 NOTE — Telephone Encounter (Signed)
Updated med list sent new rx to pharmacy...Johny Chess

## 2017-06-20 NOTE — Addendum Note (Signed)
Addended by: Earnstine Regal on: 06/20/2017 09:42 AM   Modules accepted: Orders

## 2017-06-20 NOTE — Telephone Encounter (Signed)
Submitted

## 2017-06-30 ENCOUNTER — Other Ambulatory Visit: Payer: Self-pay | Admitting: Endocrinology

## 2017-06-30 ENCOUNTER — Ambulatory Visit: Payer: Medicare Other | Admitting: Internal Medicine

## 2017-06-30 ENCOUNTER — Other Ambulatory Visit: Payer: Self-pay | Admitting: Internal Medicine

## 2017-06-30 NOTE — Telephone Encounter (Signed)
Pt called requesting this refill. She said that they are leaving to go out of the country on [place] 7th and want to make sure that she has her medications when she leaves.

## 2017-06-30 NOTE — Telephone Encounter (Signed)
MEDICATION: gabapentin (neurontin) 800mg  tab  PHARMACY: [facility]    IS THIS A 90 DAY SUPPLY : yes  IS PATIENT OUT OF MEDICATION: no  IF NOT; HOW MUCH IS LEFT: 2 days left  LAST APPOINTMENT DATE: 04/18  NEXT APPOINTMENT DATE: 10/10  OTHER COMMENTS:    **Let patient know to contact pharmacy at the end of the day to make sure medication is ready. **  ** Please notify patient to allow 48-72 hours to process**  **Encourage patient to contact the pharmacy for refills or they can request refills through [facility]**

## 2017-07-05 ENCOUNTER — Ambulatory Visit (INDEPENDENT_AMBULATORY_CARE_PROVIDER_SITE_OTHER): Payer: Medicare Other | Admitting: Podiatry

## 2017-07-05 ENCOUNTER — Ambulatory Visit: Payer: Medicare Other

## 2017-07-05 ENCOUNTER — Ambulatory Visit (INDEPENDENT_AMBULATORY_CARE_PROVIDER_SITE_OTHER): Payer: Medicare Other

## 2017-07-05 ENCOUNTER — Encounter: Payer: Self-pay | Admitting: Podiatry

## 2017-07-05 ENCOUNTER — Encounter: Payer: Self-pay | Admitting: Internal Medicine

## 2017-07-05 VITALS — BP 99/62 | HR 81 | Resp 18

## 2017-07-05 DIAGNOSIS — L97521 Non-pressure chronic ulcer of other part of left foot limited to breakdown of skin: Secondary | ICD-10-CM | POA: Diagnosis not present

## 2017-07-05 DIAGNOSIS — L97511 Non-pressure chronic ulcer of other part of right foot limited to breakdown of skin: Secondary | ICD-10-CM

## 2017-07-05 DIAGNOSIS — L97509 Non-pressure chronic ulcer of other part of unspecified foot with unspecified severity: Secondary | ICD-10-CM | POA: Diagnosis not present

## 2017-07-05 NOTE — Progress Notes (Signed)
   Subjective:    Patient ID: Katherine Herrera, female    DOB: 1966/09/14, 51 y.o.   MRN: [id number]  HPI   Chief Complaint  Patient presents with  . Foot Ulcer    i have some places on both feet    51 y.o. female returns for f/u of bilateral foot wounds. Presents in normal shoegear. Reports the wounds callus over. Denies other care. Denies N/V/F/Ch.  Review of Systems  All other systems reviewed and are negative.      Objective:   Physical Exam Vitals:   07/05/17 1335  BP: 99/62  Pulse: 81  Resp: 18   General AA&O x3. Normal mood and affect.  Vascular Dorsalis pedis and posterior tibial pulses  present 1+ bilaterally  Capillary refill normal to all digits. Pedal hair growth diminished.  Neurologic Epicritic sensation grossly present. Protective sensation absent  Dermatologic (Wound) Wound Location: Lt. foot 5th met base Wound Measurement: 1 x 1 x 0.3 Wound Base: Granular/Healthy Peri-wound: Calloused Exudate: None: wound tissue dry Wound progress: Decreased since last check.  Wound Location: Lt. foot 5th met base Wound Measurement: 2 x 2 x 0. 2 Wound Base: Granular/Healthy Peri-wound: Calloused Exudate: None: wound tissue dry Wound progress: Decreased since last check.  Orthopedic: Cavus foot type bilat. No tenderness to palpation about the wounds.   Radiographs: Taken and reviewed. No osseous erosions present. No acute fractures or dislocations.     Assessment & Plan:  Ulcerations Bilateral 5th Met Bases -Ulcerations debrided as below. -Surgical shoes dispensed. Lesions off-loaded with padding. -Educated patient on wound care. -Will avoid exostectomy at this time. -Advised to return in 2 weeks.  Procedure: Selective Debridement of Wound - Lt 5th met base Rationale: Removal of devitalized tissue from the wound to promote healing.  Pre-Debridement Wound Measurements: 1 cm x 1 cm x 0.3 cm  Post-Debridement Wound Measurements: same as pre-debridement. Type  of Debridement: Selective Tissue Removed: Devitalized soft-tissue Instrumentation: 3-0 mm dermal curette Dressing: Dry, sterile, compression dressing. Disposition: Patient tolerated procedure well. Patient to return in 1 week for follow-up.  Procedure: Selective Debridement of Wound - Rt 5th Met base Rationale: Removal of devitalized tissue from the wound to promote healing.  Pre-Debridement Wound Measurements: 2 cm x 2 cm x 0.2 cm  Post-Debridement Wound Measurements: same as pre-debridement. Type of Debridement: Selective Tissue Removed: Devitalized soft-tissue Instrumentation: 3-0 mm dermal curette Dressing: Dry, sterile, compression dressing. Disposition: Patient tolerated procedure well. Patient to return in 1 week for follow-up.

## 2017-07-10 ENCOUNTER — Telehealth: Payer: Self-pay | Admitting: Endocrinology

## 2017-07-10 NOTE — Telephone Encounter (Signed)
MEDICATION: "SMALL NEEDLES"    PHARMACY:   [place], [place] - [phone] ([phone] (Fax)   IS THIS A 90 DAY SUPPLY : yes  IS PATIENT OUT OF MEDICATION: no  IF NOT; HOW MUCH IS LEFT: unsure; "running low"  LAST APPOINTMENT DATE: 09/07/16  NEXT APPOINTMENT DATE:08/09/17  OTHER COMMENTS:    **Let patient know to contact pharmacy at the end of the day to make sure medication is ready. **  ** Please notify patient to allow 48-72 hours to process**  **Encourage patient to contact the pharmacy for refills or they can request refills through [facility]**

## 2017-07-11 ENCOUNTER — Other Ambulatory Visit: Payer: Self-pay

## 2017-07-11 MED ORDER — "INSULIN SYRINGE-NEEDLE U-100 31G X 5/16"" 0.5 ML MISC": 2 refills | Status: DC

## 2017-07-21 ENCOUNTER — Ambulatory Visit: Payer: Medicare Other | Admitting: Endocrinology

## 2017-07-24 ENCOUNTER — Ambulatory Visit: Payer: Medicare Other | Admitting: Internal Medicine

## 2017-07-26 ENCOUNTER — Ambulatory Visit (INDEPENDENT_AMBULATORY_CARE_PROVIDER_SITE_OTHER): Payer: Medicare Other | Admitting: Podiatry

## 2017-07-26 DIAGNOSIS — L97521 Non-pressure chronic ulcer of other part of left foot limited to breakdown of skin: Secondary | ICD-10-CM

## 2017-07-26 DIAGNOSIS — L97511 Non-pressure chronic ulcer of other part of right foot limited to breakdown of skin: Secondary | ICD-10-CM

## 2017-07-26 NOTE — Progress Notes (Signed)
   Subjective:    Patient ID: Katherine Herrera, female    DOB: 26-Oct-1966, 51 y.o.   MRN: [id number]  HPI 51 year old female returns for follow up of bilateral foot wounds. Spent 2 weeks out of state on vacation and states she did not go into the water as advised. Believes her wounds to be about the same. Denies pain. Denies N/V/F/Ch.  Review of Systems    Objective:   Physical Exam General AA&O x3. Normal mood and affect.  Vascular Dorsalis pedis and posterior tibial pulses  present 1+ bilaterally  Capillary refill normal to all digits. Pedal hair growth diminished.  Neurologic Epicritic sensation grossly present. Protective sensation absent  Dermatologic (Wound) Wound Location: Lt. foot 5th met head ulceration Wound Measurement: 0.8 cm x 0.5 cm x 0.1cm  Wound Base: Granular/Healthy Peri-wound: Calloused Exudate: None: wound tissue dry Wound progress: increased since last check.  Wound Location: Rt. foot 5th met base Wound Measurement: 1 cm x 1 cm x 0.1 Wound Base: Granular/Healthy Peri-wound: Calloused Exudate: None: wound tissue dry Wound progress: increased since last check.  Orthopedic: Cavus foot type bilat. No tenderness to palpation about the wounds.     Assessment & Plan:  L 5th Met Head Ulcer; R 5th Met Base Ulcer -Selective Debridement of Ulcers as Below -Dressed with abx ointment and DSD. -Conitnue off-loading in surgical shoes. -Wounds improved since last eval by approx 50%.  Procedure: Selective Debridement of Wound L 5th Met Head Rationale: Removal of devitalized tissue from the wound to promote healing.  Pre-Debridement Wound Measurements: 0.8 cm x 1.5 cm x 0.1 cm  Post-Debridement Wound Measurements: same as pre-debridement. Type of Debridement: Selective Tissue Removed: Devitalized soft-tissue Instrumentation: 312 blade, tissue nipper Dressing: Dry, sterile, compression dressing. Disposition: Patient tolerated procedure well. Patient to return in 1 week  for follow-up.  Procedure: Selective Debridement of Wound R 5th Met Base Rationale: Removal of devitalized tissue from the wound to promote healing.  Pre-Debridement Wound Measurements: 1 cm x 1 cm x 0.1 cm  Post-Debridement Wound Measurements: same as pre-debridement. Type of Debridement: Selective Tissue Removed: Devitalized soft-tissue Instrumentation: 312 blade, tissue nipper Dressing: Dry, sterile, compression dressing. Disposition: Patient tolerated procedure well. Patient to return in 1 week for follow-up.

## 2017-08-02 ENCOUNTER — Ambulatory Visit (INDEPENDENT_AMBULATORY_CARE_PROVIDER_SITE_OTHER): Payer: Medicare Other | Admitting: Internal Medicine

## 2017-08-02 ENCOUNTER — Encounter: Payer: Self-pay | Admitting: Internal Medicine

## 2017-08-02 DIAGNOSIS — M5440 Lumbago with sciatica, unspecified side: Secondary | ICD-10-CM

## 2017-08-02 DIAGNOSIS — F411 Generalized anxiety disorder: Secondary | ICD-10-CM

## 2017-08-02 DIAGNOSIS — E1140 Type 2 diabetes mellitus with diabetic neuropathy, unspecified: Secondary | ICD-10-CM | POA: Diagnosis not present

## 2017-08-02 DIAGNOSIS — H6123 Impacted cerumen, bilateral: Secondary | ICD-10-CM

## 2017-08-02 DIAGNOSIS — L97511 Non-pressure chronic ulcer of other part of right foot limited to breakdown of skin: Secondary | ICD-10-CM | POA: Diagnosis not present

## 2017-08-02 DIAGNOSIS — H9201 Otalgia, right ear: Secondary | ICD-10-CM | POA: Diagnosis not present

## 2017-08-02 DIAGNOSIS — H6120 Impacted cerumen, unspecified ear: Secondary | ICD-10-CM | POA: Insufficient documentation

## 2017-08-02 MED ORDER — OXYCODONE HCL 15 MG PO TABS: 15.0000 mg | ORAL_TABLET | Freq: Four times a day (QID) | ORAL | 0 refills | Status: DC | PRN

## 2017-08-02 NOTE — Assessment & Plan Note (Signed)
Clonazepam prn  Potential benefits of a long term benzodiazepines  use as well as potential risks  and complications were explained to the patient and were aknowledged.  

## 2017-08-02 NOTE — Assessment & Plan Note (Addendum)
Oxy prn: Rx re-written  Potential benefits of a long term opioids use as well as potential risks (i.e. addiction risk, apnea etc) and complications (i.e. Somnolence, constipation and others) were explained to the patient and were aknowledged.

## 2017-08-02 NOTE — Assessment & Plan Note (Signed)
Humulin R and N

## 2017-08-02 NOTE — Progress Notes (Signed)
Subjective:  Patient ID: Katherine Herrera, female    DOB: Feb 08, 1966  Age: 51 y.o. MRN: [id number]  CC: No chief complaint on file.   HPI Katherine Herrera presents for DM, foot wound, LBP, HTN f/u C/o R ear clogged  Outpatient Medications Prior to Visit  Medication Sig Dispense Refill  . Albuterol Sulfate (PROAIR RESPICLICK) 182 (90 BASE) MCG/ACT AEPB Inhale 1-2 puffs into the lungs 4 (four) times daily as needed. 1 each 5  . aspirin (BAYER ASPIRIN) 325 MG tablet Take 1 tablet (325 mg total) by mouth daily. 100 tablet 3  . B-D INS SYR ULTRAFINE 1CC/31G 31G X 5/16" 1 ML MISC USE AS DIRECTED 100 each 0  . baclofen (LIORESAL) 10 MG tablet TAKE 2 TABLETS BY MOUTH TWO TIMES DAILY 360 tablet 1  . BISACODYL 5 MG EC tablet TAKE TWO TABLETS BY MOUTH EVERY DAY 30 tablet 5  . Cholecalciferol (VITAMIN D3) 1000 UNIT tablet Take 2,000 Units by mouth daily.      . clonazePAM (KLONOPIN) 0.5 MG tablet TAKE ONE TABLET BY MOUTH 2 TIMES A DAY AS NEEDED FOR ANXIETY 60 tablet 2  . clotrimazole-betamethasone (LOTRISONE) cream Apply 1 application topically 2 (two) times daily. 45 g 1  . diclofenac sodium (VOLTAREN) 1 % GEL Apply 2 g topically 4 (four) times daily. 100 g 6  . ezetimibe (ZETIA) 10 MG tablet Take 1 tablet (10 mg total) by mouth daily. 30 tablet 11  . gabapentin (NEURONTIN) 800 MG tablet TAKE 1 TABLET FOUR TIMES  DAILY 360 tablet 3  . glucose blood (ONETOUCH VERIO) test strip Use to check blood sugar 2 times per day. 100 each 12  . insulin glargine (LANTUS) 100 UNIT/ML injection Inject 0.2 mLs (20 Units total) into the skin at bedtime. 20 mL 3  . insulin regular (HUMULIN R) 250 units/2.110mL (100 units/mL) injection 65 units with breakfast, and 45 units with supper, and syringes 2/day (Patient taking differently: Inject 14 Units into the skin 2 (two) times daily before a meal. ) 40 mL 11  . Insulin Syringe-Needle U-100 (B-D INS SYRINGE 0.5CC/31GX5/16) 31G X 5/16" 0.5 ML MISC USE AS DIRECTED WITH INSULIN  4 TIMES A DAY 400 each 2  . lamoTRIgine (LAMICTAL) 25 MG tablet TAKE 1 TABLET BY MOUTH TWO  TIMES DAILY 180 tablet 1  . Lancet Devices (ACCU-CHEK SOFTCLIX) lancets Test two times daily 100 each 12  . levothyroxine (SYNTHROID, LEVOTHROID) 75 MCG tablet TAKE 1 TABLET BY MOUTH  DAILY 90 tablet 1  . lidocaine (XYLOCAINE) 5 % ointment Apply 1 application topically as needed. 35.44 g 0  . ondansetron (ZOFRAN ODT) 4 MG disintegrating tablet Take 1 tablet (4 mg total) by mouth every 8 (eight) hours as needed for nausea or vomiting. 21 tablet 0  . ONETOUCH DELICA LANCETS 99B MISC TEST TWO TIMES DAILY 200 each 2  . oxyCODONE (ROXICODONE) 15 MG immediate release tablet Take 1 tablet (15 mg total) by mouth every 6 (six) hours as needed for pain. Please fill on or after 08/19/17. 120 tablet 0  . polyethylene glycol powder (GLYCOLAX/MIRALAX) powder TAKE 17 GRAMS BY MOUTH EVERY 12 HOURS AS NEEDED 500 g 5  . rOPINIRole (REQUIP) 0.5 MG tablet TAKE ONE TABLET BY MOUTH AT BEDTIME 30 tablet 5  . vitamin B-12 (CYANOCOBALAMIN) 1000 MCG tablet Take 1,000 mcg by mouth daily.       No facility-administered medications prior to visit.     ROS Review of Systems  Constitutional: Positive  for fatigue. Negative for activity change, appetite change, chills and unexpected weight change.  HENT: Negative for congestion, mouth sores and sinus pressure.   Eyes: Negative for visual disturbance.  Respiratory: Negative for cough and chest tightness.   Gastrointestinal: Negative for abdominal pain and nausea.  Genitourinary: Negative for difficulty urinating, frequency and vaginal pain.  Musculoskeletal: Positive for arthralgias, back pain and gait problem.  Skin: Negative for pallor and rash.  Neurological: Negative for dizziness, tremors, weakness, numbness and headaches.  Psychiatric/Behavioral: Negative for confusion and sleep disturbance. The patient is nervous/anxious.     Objective:  BP 122/68 (BP Location: Left Arm,  Patient Position: Sitting, Cuff Size: Large)   Pulse 68   Temp 98 F (36.7 C) (Oral)   Ht 5' 9.5" (1.765 m)   Wt 208 lb (94.3 kg)   SpO2 (!) 87%   BMI 30.28 kg/m   BP Readings from Last 3 Encounters:  08/02/17 122/68  07/05/17 99/62  06/12/17 120/72    Wt Readings from Last 3 Encounters:  08/02/17 208 lb (94.3 kg)  06/12/17 213 lb (96.6 kg)  05/19/17 216 lb (98 kg)    Physical Exam  Constitutional: She appears well-developed. No distress.  HENT:  Head: Normocephalic.  Right Ear: External ear normal.  Left Ear: External ear normal.  Nose: Nose normal.  Mouth/Throat: Oropharynx is clear and moist.  Eyes: Pupils are equal, round, and reactive to light. Conjunctivae are normal. Right eye exhibits no discharge. Left eye exhibits no discharge.  Neck: Normal range of motion. Neck supple. No JVD present. No tracheal deviation present. No thyromegaly present.  Cardiovascular: Normal rate, regular rhythm and normal heart sounds.   Pulmonary/Chest: No stridor. No respiratory distress. She has no wheezes.  Abdominal: Soft. Bowel sounds are normal. She exhibits no distension and no mass. There is no tenderness. There is no rebound and no guarding.  Musculoskeletal: She exhibits tenderness. She exhibits no edema.  Lymphadenopathy:    She has no cervical adenopathy.  Neurological: She displays normal reflexes. No cranial nerve deficit. She exhibits normal muscle tone. Coordination normal.  Skin: No rash noted. No erythema.  Psychiatric: She has a normal mood and affect. Her behavior is normal. Judgment and thought content normal.  B wax R lat foot ulcer    Procedure Note :     Procedure :  Ear irrigation   Indication:  Cerumen impaction B   Risks, including pain, dizziness, eardrum perforation, bleeding, infection and others as well as benefits were explained to the patient in detail. Verbal consent was obtained and the patient agreed to proceed.    We used "The Elephant Ear  Irrigation Device" filled with lukewarm water for irrigation. A large amount wax was recovered. Procedure has also required manual wax removal with an ear loop.   Tolerated well. Complications: None.   Postprocedure instructions :  Call if problems.    Lab Results  Component Value Date   WBC 10.5 05/10/2017   HGB 13.5 05/10/2017   HCT 40.5 05/10/2017   PLT 546.0 (H) 05/10/2017   GLUCOSE 137 (H) 05/10/2017   CHOL 223 (H) 07/19/2016   TRIG (H) 07/19/2016    527.0 Triglyceride is over 400; calculations on Lipids are invalid.   HDL 27.40 (L) 07/19/2016   LDLDIRECT 106.0 07/19/2016   LDLCALC 81 01/02/2013   ALT 8 05/10/2017   AST 9 05/10/2017   NA 133 (L) 05/10/2017   K 4.4 05/10/2017   CL 98 05/10/2017   CREATININE  1.74 (H) 05/10/2017   BUN 26 (H) 05/10/2017   CO2 29 05/10/2017   TSH 1.16 07/19/2016   INR 0.95 01/28/2014   HGBA1C 8.5 (H) 05/10/2017   MICROALBUR 0.9 07/19/2016    No results found.  Assessment & Plan:   There are no diagnoses linked to this encounter. I am having Ms. Paar maintain her vitamin B-12, cholecalciferol, lidocaine, Albuterol Sulfate, aspirin, glucose blood, accu-chek softclix, B-D INS SYR ULTRAFINE 1CC/31G, diclofenac sodium, insulin regular, clonazePAM, clotrimazole-betamethasone, lamoTRIgine, ONETOUCH DELICA LANCETS 88B, ondansetron, polyethylene glycol powder, bisacodyl, oxyCODONE, ezetimibe, rOPINIRole, insulin glargine, levothyroxine, gabapentin, baclofen, and Insulin Syringe-Needle U-100.  No orders of the defined types were placed in this encounter.    Follow-up: No Follow-up on file.  Walker Kehr, MD

## 2017-08-02 NOTE — Assessment & Plan Note (Signed)
See procedure 

## 2017-08-02 NOTE — Assessment & Plan Note (Signed)
Wax B - will irrigate

## 2017-08-04 ENCOUNTER — Encounter: Payer: Self-pay | Admitting: Podiatry

## 2017-08-04 ENCOUNTER — Ambulatory Visit (INDEPENDENT_AMBULATORY_CARE_PROVIDER_SITE_OTHER): Payer: Medicare Other | Admitting: Podiatry

## 2017-08-04 DIAGNOSIS — L97511 Non-pressure chronic ulcer of other part of right foot limited to breakdown of skin: Secondary | ICD-10-CM | POA: Diagnosis not present

## 2017-08-04 DIAGNOSIS — L97521 Non-pressure chronic ulcer of other part of left foot limited to breakdown of skin: Secondary | ICD-10-CM | POA: Diagnosis not present

## 2017-08-04 NOTE — Progress Notes (Signed)
  Subjective:  Patient ID: Katherine Herrera, female    DOB: 1966/05/14,  MRN: [id number]  Chief Complaint  Patient presents with  . Foot Ulcer    Follow up ulcers sub 5th MPJ left and 5th met base right   "I guess they are okay"   51 y.o. female returns for the above complaint. Has been applying antibiotic ointment, padding and wearing her surgical shoes. No new complaints.  Objective:   General AA&O x3. Normal mood and affect.  Vascular Dorsalis pedis and posterior tibial pulses  present 1+ bilaterally  Capillary refill normal to all digits. Pedal hair growth diminished.  Neurologic Epicritic sensation grossly present. Protective sensation absent  Dermatologic (Wound) Wound Location: Lt. foot 5th met head ulceration Wound Measurement: 0.7 cm x 0.5 cm x 0.1cm  Wound Base: Granular/Healthy Peri-wound: Calloused Exudate: None: wound tissue dry Wound progress: increased since last check.  Wound Location: Rt. foot 5th met base Wound Measurement: 0.9 cm x 0.5 cm x 0.1 Wound Base: Granular/Healthy Peri-wound: Calloused Exudate: None: wound tissue dry Wound progress: increased since last check.  Orthopedic: Cavus foot type bilat. No tenderness to palpation about the wounds.      Assessment & Plan:  Patient was evaluated and treated and all questions answered.  L 5th Met Head Ulcer; R 5th Met Base Ulcer -Selective Debridement of Ulcers as Below -Dressed with padding and dry sterile dressing. -Continue off-loading in surgical shoes. -Wounds slightly improved at this visit  Procedure: Selective Debridement of Wound L 5th Met Head Rationale: Removal of devitalized tissue from the wound to promote healing.  Pre-Debridement Wound Measurements: 0.7 cm x 0.5 cm x 0.1 cm  Post-Debridement Wound Measurements: same as pre-debridement. Type of Debridement: Selective Tissue Removed: Devitalized soft-tissue Instrumentation: 312 blade, tissue nipper Dressing: Dry, sterile, compression  dressing. Disposition: Patient tolerated procedure well. Patient to return in 1 week for follow-up.  Procedure: Selective Debridement of Wound R 5th Met Base Rationale: Removal of devitalized tissue from the wound to promote healing.  Pre-Debridement Wound Measurements: 0.9 cm x 0.5 cm x 0.1 cm  Post-Debridement Wound Measurements: same as pre-debridement. Type of Debridement: Selective Tissue Removed: Devitalized soft-tissue Instrumentation: 312 blade, tissue nipper Dressing: Dry, sterile, compression dressing. Disposition: Patient tolerated procedure well. Patient to return in 1 week for follow-up.  Return in about 1 week (around 08/11/2017) for Bilateral foot wounds.

## 2017-08-07 DIAGNOSIS — N760 Acute vaginitis: Secondary | ICD-10-CM | POA: Diagnosis not present

## 2017-08-09 ENCOUNTER — Ambulatory Visit (INDEPENDENT_AMBULATORY_CARE_PROVIDER_SITE_OTHER): Payer: Medicare Other | Admitting: Endocrinology

## 2017-08-09 ENCOUNTER — Encounter: Payer: Self-pay | Admitting: Endocrinology

## 2017-08-09 VITALS — BP 110/72 | HR 75 | Wt 206.0 lb

## 2017-08-09 DIAGNOSIS — E1140 Type 2 diabetes mellitus with diabetic neuropathy, unspecified: Secondary | ICD-10-CM

## 2017-08-09 LAB — POCT GLYCOSYLATED HEMOGLOBIN (HGB A1C): Hemoglobin A1C: 11.8

## 2017-08-09 MED ORDER — INSULIN NPH (HUMAN) (ISOPHANE) 100 UNIT/ML ~~LOC~~ SUSP: 20.0000 [IU] | Freq: Every day | SUBCUTANEOUS | 11 refills | Status: DC

## 2017-08-09 MED ORDER — INSULIN REGULAR HUMAN 100 UNIT/ML IJ SOLN: 25.0000 [IU] | Freq: Three times a day (TID) | INTRAMUSCULAR | 11 refills | Status: DC

## 2017-08-09 NOTE — Progress Notes (Signed)
Subjective:    Patient ID: Katherine Herrera, female    DOB: [birth year], 51 y.o.   MRN: [id number]  HPI Pt returns for f/u of diabetes mellitus: DM type: Insulin-requiring type 2.  Dx'ed: 2006.   Complications: polyneuropathy, renal failure and foot ulcers.  Therapy: insulin since 2012.   GDM: never. DKA: never.   Severe hypoglycemia: last episode was in late 2013.   Pancreatitis: never.   Other: she takes multiple daily injections; she takes human insulin, for cost reasons; she has long duration of action of insulin.   Interval history: She takes regular, 20 units with breakfast and lunch, but none with supper.  She also takes lantus, 20 units QHS.  Pt says she can no longer afford lantus.  no cbg record, but states cbg's are in the 200's.  There is no trend throughout the day.  Past Medical History:  Diagnosis Date  . Anxiety   . COPD (chronic obstructive pulmonary disease) ([place])   . DM (diabetes mellitus) ([place])   . Gout    possible  . Hyperlipidemia   . Hypothyroidism   . LBP (low back pain)    DR Patrice [place]  . Migraine   . Peripheral neuropathy   . Syncope    poss conversion disorder vs psychogenic seizures  . Tachycardia    diet coke overuse?  Marland Kitchen Tenosynovitis of foot and ankle 09/03/2013  . TTS (tarsal tunnel syndrome) 2010   Left Foot [facility] in [place]  . Vitamin D deficiency     Past Surgical History:  Procedure Laterality Date  . ABDOMINAL HYSTERECTOMY    . CARPAL TUNNEL RELEASE  2010   LEFT  . CERVICAL FUSION     C4-5    Social History   Social History  . Marital status: Married    Spouse name: N/A  . Number of children: N/A  . Years of education: N/A   Occupational History  . Not on file.   Social History Main Topics  . Smoking status: Current Every Day Smoker    Packs/day: 1.00    Last attempt to quit: 12/01/2009  . Smokeless tobacco: Never Used  . Alcohol use No  . Drug use: No  . Sexual activity: Yes   Other Topics Concern  . Not on file   Social  History Narrative   GYN - Dr Radene Knee      FAMILY HISTORY   Lung cancer   Mother w/alcoholism      Married 3d GS due Nov   Diet Coke: 10 cans a day      Regular exercise - NO      Former smoker 2009 restared 2010 stopped 12/2009    Current Outpatient Prescriptions on File Prior to Visit  Medication Sig Dispense Refill  . Albuterol Sulfate (PROAIR RESPICLICK) 462 (90 BASE) MCG/ACT AEPB Inhale 1-2 puffs into the lungs 4 (four) times daily as needed. 1 each 5  . aspirin (BAYER ASPIRIN) 325 MG tablet Take 1 tablet (325 mg total) by mouth daily. 100 tablet 3  . B-D INS SYR ULTRAFINE 1CC/31G 31G X 5/16" 1 ML MISC USE AS DIRECTED 100 each 0  . baclofen (LIORESAL) 10 MG tablet TAKE 2 TABLETS BY MOUTH TWO TIMES DAILY 360 tablet 1  . BISACODYL 5 MG EC tablet TAKE TWO TABLETS BY MOUTH EVERY DAY 30 tablet 5  . Cholecalciferol (VITAMIN D3) 1000 UNIT tablet Take 2,000 Units by mouth daily.      . clonazePAM (KLONOPIN) 0.5 MG  tablet TAKE ONE TABLET BY MOUTH 2 TIMES A DAY AS NEEDED FOR ANXIETY 60 tablet 2  . clotrimazole-betamethasone (LOTRISONE) cream Apply 1 application topically 2 (two) times daily. 45 g 1  . diclofenac sodium (VOLTAREN) 1 % GEL Apply 2 g topically 4 (four) times daily. 100 g 6  . ezetimibe (ZETIA) 10 MG tablet Take 1 tablet (10 mg total) by mouth daily. 30 tablet 11  . gabapentin (NEURONTIN) 800 MG tablet TAKE 1 TABLET FOUR TIMES  DAILY 360 tablet 3  . glucose blood (ONETOUCH VERIO) test strip Use to check blood sugar 2 times per day. 100 each 12  . Insulin Syringe-Needle U-100 (B-D INS SYRINGE 0.5CC/31GX5/16) 31G X 5/16" 0.5 ML MISC USE AS DIRECTED WITH INSULIN 4 TIMES A DAY 400 each 2  . lamoTRIgine (LAMICTAL) 25 MG tablet TAKE 1 TABLET BY MOUTH TWO  TIMES DAILY 180 tablet 1  . Lancet Devices (ACCU-CHEK SOFTCLIX) lancets Test two times daily 100 each 12  . levothyroxine (SYNTHROID, LEVOTHROID) 75 MCG tablet TAKE 1 TABLET BY MOUTH  DAILY 90 tablet 1  . lidocaine (XYLOCAINE) 5 %  ointment Apply 1 application topically as needed. 35.44 g 0  . ondansetron (ZOFRAN ODT) 4 MG disintegrating tablet Take 1 tablet (4 mg total) by mouth every 8 (eight) hours as needed for nausea or vomiting. 21 tablet 0  . ONETOUCH DELICA LANCETS 75I MISC TEST TWO TIMES DAILY 200 each 2  . oxyCODONE (ROXICODONE) 15 MG immediate release tablet Take 1 tablet (15 mg total) by mouth every 6 (six) hours as needed for pain. Please fill on or after 09/02/17. 120 tablet 0  . polyethylene glycol powder (GLYCOLAX/MIRALAX) powder TAKE 17 GRAMS BY MOUTH EVERY 12 HOURS AS NEEDED 500 g 5  . rOPINIRole (REQUIP) 0.5 MG tablet TAKE ONE TABLET BY MOUTH AT BEDTIME 30 tablet 5  . vitamin B-12 (CYANOCOBALAMIN) 1000 MCG tablet Take 1,000 mcg by mouth daily.       No current facility-administered medications on file prior to visit.     Allergies  Allergen Reactions  . Hydromorphone Hcl Itching  . Cephalexin Nausea And Vomiting  . Meperidine Rash  . Rosuvastatin Calcium Other (See Comments)    myalgia  . Crestor [Rosuvastatin Calcium]     myalgia  . Lovastatin Other (See Comments)    Unknown   . Metformin Nausea And Vomiting  . Niacin Other (See Comments)    Unknown   . Paroxetine Other (See Comments)    Unknown   . Meperidine Hcl Rash    Family History  Problem Relation Age of Onset  . Diabetes Mother   . Hypertension Mother   . Cancer Father 43       ? bone cancer  . [name] Brother     BP 110/72   Pulse 75   Wt 206 lb (93.4 kg)   SpO2 97%   BMI 29.98 kg/m   Review of Systems She denies hypoglycemia.      Objective:   Physical Exam VITAL SIGNS:  See vs page GENERAL: no distress LUNGS:  Clear to auscultation NECK: There is no palpable thyroid enlargement.  No thyroid nodule is palpable.  No palpable lymphadenopathy at the anterior neck. HEART:  Regular rate and rhythm without murmurs noted. Normal S1,S2.   Pulses: no carotid bruit Ext: both feet are partially bandaged.    Neuro: sensation is intact to touch on the feet, but decreased from normal    Lab Results  Component  Value Date   HGBA1C 11.8 08/09/2017   Lab Results  Component Value Date   CREATININE 1.74 (H) 05/10/2017   BUN 26 (H) 05/10/2017   NA 133 (L) 05/10/2017   K 4.4 05/10/2017   CL 98 05/10/2017   CO2 29 05/10/2017       Assessment & Plan:  Insulin-requiring type 2 DM, with polyneuropathy: worse.  She requests cheaper HS insulin.   Renal failure: this is also contributing to long duration of action of insulin.    Patient Instructions  check your blood sugar twice a day.  vary the time of day when you check, between before the 3 meals, and at bedtime.  also check if you have symptoms of your blood sugar being too high or too low.  please keep a record of the readings and bring it to your next appointment here.  please call us sooner if your blood sugar goes below 70, or if it stays over 200.   Please increase the regular to 25 units 3 times a day (just before each meal), and I have sent a prescription to your pharmacy, to change lantus to NPH, 20 units at bedtime. Please call or message [place] next week, to tell us how the blood sugar is doing.   please come back for a follow-up appointment in 3 months.

## 2017-08-09 NOTE — Progress Notes (Signed)
1/18

## 2017-08-09 NOTE — Patient Instructions (Addendum)
check your blood sugar twice a day.  vary the time of day when you check, between before the 3 meals, and at bedtime.  also check if you have symptoms of your blood sugar being too high or too low.  please keep a record of the readings and bring it to your next appointment here.  please call us sooner if your blood sugar goes below 70, or if it stays over 200.   Please increase the regular to 25 units 3 times a day (just before each meal), and I have sent a prescription to your pharmacy, to change lantus to NPH, 20 units at bedtime. Please call or message [place] next week, to tell us how the blood sugar is doing.   please come back for a follow-up appointment in 3 months.

## 2017-08-10 ENCOUNTER — Telehealth: Payer: Self-pay | Admitting: Endocrinology

## 2017-08-10 NOTE — Telephone Encounter (Signed)
[place] stated patient has prescription for [employer] prefer  Humulin N. [phone]

## 2017-08-11 ENCOUNTER — Other Ambulatory Visit: Payer: Self-pay

## 2017-08-11 ENCOUNTER — Ambulatory Visit (INDEPENDENT_AMBULATORY_CARE_PROVIDER_SITE_OTHER): Payer: Medicare Other | Admitting: Podiatry

## 2017-08-11 DIAGNOSIS — L97511 Non-pressure chronic ulcer of other part of right foot limited to breakdown of skin: Secondary | ICD-10-CM

## 2017-08-11 DIAGNOSIS — L97521 Non-pressure chronic ulcer of other part of left foot limited to breakdown of skin: Secondary | ICD-10-CM | POA: Diagnosis not present

## 2017-08-11 MED ORDER — INSULIN NPH (HUMAN) (ISOPHANE) 100 UNIT/ML ~~LOC~~ SUSP: SUBCUTANEOUS | 11 refills | Status: DC

## 2017-08-11 NOTE — Telephone Encounter (Signed)
Ordered as Humulin instead.

## 2017-08-14 DIAGNOSIS — E113293 Type 2 diabetes mellitus with mild nonproliferative diabetic retinopathy without macular edema, bilateral: Secondary | ICD-10-CM | POA: Diagnosis not present

## 2017-08-14 DIAGNOSIS — H5213 Myopia, bilateral: Secondary | ICD-10-CM | POA: Diagnosis not present

## 2017-08-14 NOTE — Progress Notes (Signed)
  Subjective:  Patient ID: Katherine Herrera, female    DOB: Jul 30, 1966,  MRN: [id number]  51 y.o. female returns for Follow-up of bilateral foot ulcers. Denies any complaints. Requests more padding to her offloading shoes as she believes that they are thinning out.  Objective:   General AA&O x3. Normal mood and affect.  Vascular Dorsalis pedis and posterior tibial pulses  present 1+ bilaterally  Capillary refill normal to all digits. Pedal hair growth diminished.  Neurologic Epicritic sensation grossly present. Protective sensation absent  Dermatologic (Wound) Wound Location: Lt. foot 5th met head ulceration Wound Measurement: 0.5 cm x 0.4 cm x 0.1cm  Wound Base: Granular/Healthy Peri-wound: Calloused Exudate: None: wound tissue dry Wound progress: increased since last check.  Wound Location: Rt. foot 5th met base Wound Measurement: 0.6 cm x 0.4 cm x 0.1 Wound Base: Granular/Healthy Peri-wound: Calloused Exudate: None: wound tissue dry Wound progress: increased since last check.  Orthopedic: Cavus foot type bilat. No tenderness to palpation about the wounds.      Assessment & Plan:  Patient was evaluated and treated and all questions answered.  L 5th Met Head Ulcer; R 5th Met Base Ulcer -Selective Debridement of Ulcers as Below -Dressed with Endoform matrix to promote healing and dry sterile dressing. -Continue off-loading in surgical shoes. Padding supplemented today -Wounds both improved since last visit.  Procedure: Selective Debridement of Wound L 5th Met Head Rationale: Removal of devitalized tissue from the wound to promote healing.  Pre-Debridement Wound Measurements: 0.5 cm x 0.4 cm x 0.1 cm  Post-Debridement Wound Measurements: same as pre-debridement. Type of Debridement: Selective Tissue Removed: Devitalized soft-tissue Instrumentation: 312 blade, tissue nipper Dressing: Dry, sterile, compression dressing. Disposition: Patient tolerated procedure well. Patient  to return in 1 week for follow-up.  Procedure: Selective Debridement of Wound R 5th Met Base Rationale: Removal of devitalized tissue from the wound to promote healing.  Pre-Debridement Wound Measurements: 0.6 cm x 0.4 cm x 0.1 cm  Post-Debridement Wound Measurements: same as pre-debridement. Type of Debridement: Selective Tissue Removed: Devitalized soft-tissue Instrumentation: 312 blade, tissue nipper Dressing: Dry, sterile, compression dressing. Disposition: Patient tolerated procedure well. Patient to return in 1 week for follow-up.  Return in about 1 week (around 08/18/2017).

## 2017-08-18 ENCOUNTER — Ambulatory Visit: Payer: Medicare Other | Admitting: Podiatry

## 2017-08-18 ENCOUNTER — Other Ambulatory Visit: Payer: Self-pay | Admitting: Internal Medicine

## 2017-08-28 ENCOUNTER — Other Ambulatory Visit: Payer: Self-pay | Admitting: Internal Medicine

## 2017-09-01 ENCOUNTER — Ambulatory Visit: Payer: Medicare Other | Admitting: Podiatry

## 2017-09-06 ENCOUNTER — Ambulatory Visit (INDEPENDENT_AMBULATORY_CARE_PROVIDER_SITE_OTHER): Payer: Medicare Other | Admitting: Podiatry

## 2017-09-06 ENCOUNTER — Encounter: Payer: Self-pay | Admitting: Podiatry

## 2017-09-06 DIAGNOSIS — L97511 Non-pressure chronic ulcer of other part of right foot limited to breakdown of skin: Secondary | ICD-10-CM | POA: Diagnosis not present

## 2017-09-06 DIAGNOSIS — L97521 Non-pressure chronic ulcer of other part of left foot limited to breakdown of skin: Secondary | ICD-10-CM | POA: Diagnosis not present

## 2017-09-08 ENCOUNTER — Other Ambulatory Visit: Payer: Self-pay | Admitting: Endocrinology

## 2017-09-10 ENCOUNTER — Other Ambulatory Visit: Payer: Self-pay | Admitting: Internal Medicine

## 2017-09-20 ENCOUNTER — Ambulatory Visit: Payer: Medicare Other | Admitting: Podiatry

## 2017-09-24 NOTE — Progress Notes (Signed)
  Subjective:  Patient ID: Katherine Herrera, female    DOB: 11/23/1965,  MRN: [id number]  Chief Complaint  Patient presents with  . Foot Ulcer    my right foot seems to be worse and the left foot is doing better   51 y.o. female returns for the above complaint.  Based right foot seems to be worse with the left foot is doing a little better.  No new issues  Objective:  There were no vitals filed for this visit. General AA&O x3. Normal mood and affect.  Vascular Pedal pulses palpable.  Neurologic Epicritic sensation grossly intact.  Dermatologic Left foot fifth met at ulceration measuring 0.5 x 0.4 x 0.1 granular wound base with periwound callus.   Right fifth met base measuring 0.6 x 0.6 x 0.1 with granular base.  Periwound callus noted.  Orthopedic: No pain to palpation either foot.   Assessment & Plan:  Patient was evaluated and treated and all questions answered.  Ulcerations bilateral feet -Appear to be somewhat improving/ -Padding dispensed -Continue local care  Return in about 2 weeks (around 09/20/2017) for Wound Care.

## 2017-10-02 DIAGNOSIS — Z1231 Encounter for screening mammogram for malignant neoplasm of breast: Secondary | ICD-10-CM | POA: Diagnosis not present

## 2017-10-02 DIAGNOSIS — Z01419 Encounter for gynecological examination (general) (routine) without abnormal findings: Secondary | ICD-10-CM | POA: Diagnosis not present

## 2017-10-02 DIAGNOSIS — Z13820 Encounter for screening for osteoporosis: Secondary | ICD-10-CM | POA: Diagnosis not present

## 2017-10-02 DIAGNOSIS — N958 Other specified menopausal and perimenopausal disorders: Secondary | ICD-10-CM | POA: Diagnosis not present

## 2017-10-06 ENCOUNTER — Ambulatory Visit (INDEPENDENT_AMBULATORY_CARE_PROVIDER_SITE_OTHER): Payer: Medicare Other | Admitting: Internal Medicine

## 2017-10-06 ENCOUNTER — Other Ambulatory Visit (INDEPENDENT_AMBULATORY_CARE_PROVIDER_SITE_OTHER): Payer: Medicare Other

## 2017-10-06 ENCOUNTER — Encounter: Payer: Self-pay | Admitting: Internal Medicine

## 2017-10-06 DIAGNOSIS — E034 Atrophy of thyroid (acquired): Secondary | ICD-10-CM | POA: Diagnosis not present

## 2017-10-06 DIAGNOSIS — E08621 Diabetes mellitus due to underlying condition with foot ulcer: Secondary | ICD-10-CM

## 2017-10-06 DIAGNOSIS — L959 Vasculitis limited to the skin, unspecified: Secondary | ICD-10-CM

## 2017-10-06 DIAGNOSIS — E1140 Type 2 diabetes mellitus with diabetic neuropathy, unspecified: Secondary | ICD-10-CM | POA: Diagnosis not present

## 2017-10-06 DIAGNOSIS — L97512 Non-pressure chronic ulcer of other part of right foot with fat layer exposed: Secondary | ICD-10-CM

## 2017-10-06 DIAGNOSIS — M5440 Lumbago with sciatica, unspecified side: Secondary | ICD-10-CM | POA: Diagnosis not present

## 2017-10-06 LAB — TSH: TSH: 0.62 u[IU]/mL (ref 0.35–4.50)

## 2017-10-06 MED ORDER — LEVOTHYROXINE SODIUM 75 MCG PO TABS: 75.0000 ug | ORAL_TABLET | Freq: Every day | ORAL | 3 refills | Status: DC

## 2017-10-06 MED ORDER — OXYCODONE HCL 15 MG PO TABS: 15.0000 mg | ORAL_TABLET | Freq: Four times a day (QID) | ORAL | 0 refills | Status: DC | PRN

## 2017-10-06 MED ORDER — BACLOFEN 10 MG PO TABS: ORAL_TABLET | ORAL | 3 refills | Status: DC

## 2017-10-06 NOTE — Assessment & Plan Note (Signed)
On Oxycodone 15 mg  Potential benefits of a long term opioids use as well as potential risks (i.e. addiction risk, apnea etc) and complications (i.e. Somnolence, constipation and others) were explained to the patient and were aknowledged. Risks of use w/benzodiazepines discussed  Do not take w/Clonazepam  

## 2017-10-06 NOTE — Progress Notes (Signed)
Subjective:  Patient ID: Katherine Herrera, female    DOB: 08/07/1966  Age: 51 y.o. MRN: [id number]  CC: No chief complaint on file.   HPI Katherine Herrera presents for DM, leg ulcers, neuropathy f/u  Outpatient Medications Prior to Visit  Medication Sig Dispense Refill  . Albuterol Sulfate (PROAIR RESPICLICK) 505 (90 BASE) MCG/ACT AEPB Inhale 1-2 puffs into the lungs 4 (four) times daily as needed. 1 each 5  . aspirin (BAYER ASPIRIN) 325 MG tablet Take 1 tablet (325 mg total) by mouth daily. 100 tablet 3  . B-D INS SYR ULTRAFINE 1CC/31G 31G X 5/16" 1 ML MISC USE AS DIRECTED 100 each 0  . BISACODYL 5 MG EC tablet TAKE TWO TABLETS BY MOUTH EVERY DAY 30 tablet 5  . BISACODYL 5 MG EC tablet TAKE TWO TABLETS BY MOUTH EVERY DAY 60 tablet 2  . Cholecalciferol (VITAMIN D3) 1000 UNIT tablet Take 2,000 Units by mouth daily.      . clonazePAM (KLONOPIN) 0.5 MG tablet TAKE ONE TABLET BY MOUTH 2 TIMES A DAY AS NEEDED FOR ANXIETY 60 tablet 2  . clotrimazole-betamethasone (LOTRISONE) cream Apply 1 application topically 2 (two) times daily. 45 g 1  . diclofenac sodium (VOLTAREN) 1 % GEL Apply 2 g topically 4 (four) times daily. 100 g 6  . ezetimibe (ZETIA) 10 MG tablet Take 1 tablet (10 mg total) by mouth daily. 30 tablet 11  . gabapentin (NEURONTIN) 800 MG tablet TAKE 1 TABLET FOUR TIMES  DAILY 360 tablet 3  . glucose blood (ONETOUCH VERIO) test strip Use to check blood sugar 2 times per day. 100 each 12  . insulin NPH Human (HUMULIN N) 100 UNIT/ML injection Inject 20 units into the skin daily at bedtime. 10 mL 11  . insulin regular (HUMULIN R) 100 units/mL injection Inject 0.25 mLs (25 Units total) into the skin 3 (three) times daily before meals. 65 units with breakfast, and 45 units with supper, and syringes 2/day 40 mL 11  . Insulin Syringe-Needle U-100 (SURE COMFORT INSULIN SYRINGE) 31G X 5/16" 0.5 ML MISC USE AS DIRECTED WITH INSULIN 4 TIMES A DAY 400 each 2  . lamoTRIgine (LAMICTAL) 25 MG tablet  TAKE 1 TABLET BY MOUTH TWO  TIMES DAILY 180 tablet 1  . Lancet Devices (ACCU-CHEK SOFTCLIX) lancets Test two times daily 100 each 12  . lidocaine (XYLOCAINE) 5 % ointment Apply 1 application topically as needed. 35.44 g 0  . lisinopril (PRINIVIL,ZESTRIL) 10 MG tablet TAKE ONE TABLET BY MOUTH EVERY DAY 30 tablet 11  . ondansetron (ZOFRAN ODT) 4 MG disintegrating tablet Take 1 tablet (4 mg total) by mouth every 8 (eight) hours as needed for nausea or vomiting. 21 tablet 0  . ONETOUCH DELICA LANCETS 39J MISC TEST TWO TIMES DAILY 200 each 2  . oxyCODONE (ROXICODONE) 15 MG immediate release tablet Take 1 tablet (15 mg total) by mouth every 6 (six) hours as needed for pain. Please fill on or after 09/02/17. 120 tablet 0  . polyethylene glycol powder (GLYCOLAX/MIRALAX) powder TAKE 17 GRAMS BY MOUTH EVERY 12 HOURS AS NEEDED 500 g 5  . rOPINIRole (REQUIP) 0.5 MG tablet TAKE ONE TABLET BY MOUTH AT BEDTIME 30 tablet 5  . vitamin B-12 (CYANOCOBALAMIN) 1000 MCG tablet Take 1,000 mcg by mouth daily.      . baclofen (LIORESAL) 10 MG tablet TAKE 2 TABLETS BY MOUTH TWO TIMES DAILY 360 tablet 1  . levothyroxine (SYNTHROID, LEVOTHROID) 75 MCG tablet TAKE 1 TABLET BY  MOUTH  DAILY 90 tablet 1   No facility-administered medications prior to visit.     ROS Review of Systems  Constitutional: Positive for fatigue. Negative for activity change, appetite change, chills and unexpected weight change.  HENT: Negative for congestion, mouth sores and sinus pressure.   Eyes: Negative for visual disturbance.  Respiratory: Negative for cough and chest tightness.   Gastrointestinal: Negative for abdominal pain and nausea.  Genitourinary: Negative for difficulty urinating, frequency and vaginal pain.  Musculoskeletal: Positive for arthralgias, back pain and gait problem.  Skin: Positive for color change and wound. Negative for pallor and rash.  Neurological: Negative for dizziness, tremors, weakness, numbness and headaches.    Psychiatric/Behavioral: Negative for confusion and sleep disturbance.    Objective:  BP 118/74 (BP Location: Left Arm, Patient Position: Sitting, Cuff Size: Large)   Pulse 85   Temp 98.5 F (36.9 C) (Oral)   Ht 5' 9.5" (1.765 m)   Wt 216 lb (98 kg)   SpO2 98%   BMI 31.44 kg/m   BP Readings from Last 3 Encounters:  10/06/17 118/74  08/09/17 110/72  08/02/17 122/68    Wt Readings from Last 3 Encounters:  10/06/17 216 lb (98 kg)  08/09/17 206 lb (93.4 kg)  08/02/17 208 lb (94.3 kg)    Physical Exam  Constitutional: She appears well-developed. No distress.  HENT:  Head: Normocephalic.  Right Ear: External ear normal.  Left Ear: External ear normal.  Nose: Nose normal.  Mouth/Throat: Oropharynx is clear and moist.  Eyes: Conjunctivae are normal. Pupils are equal, round, and reactive to light. Right eye exhibits no discharge. Left eye exhibits no discharge.  Neck: Normal range of motion. Neck supple. No JVD present. No tracheal deviation present. No thyromegaly present.  Cardiovascular: Normal rate, regular rhythm and normal heart sounds.  Pulmonary/Chest: No stridor. No respiratory distress. She has no wheezes.  Abdominal: Soft. Bowel sounds are normal. She exhibits no distension and no mass. There is no tenderness. There is no rebound and no guarding.  Musculoskeletal: She exhibits tenderness. She exhibits no edema.  Lymphadenopathy:    She has no cervical adenopathy.  Neurological: She displays normal reflexes. No cranial nerve deficit. She exhibits normal muscle tone. Coordination normal.  Skin: No rash noted. No erythema.  Psychiatric: She has a normal mood and affect. Her behavior is normal. Judgment and thought content normal.  LS tender Wounds dressed  Lab Results  Component Value Date   WBC 10.5 05/10/2017   HGB 13.5 05/10/2017   HCT 40.5 05/10/2017   PLT 546.0 (H) 05/10/2017   GLUCOSE 137 (H) 05/10/2017   CHOL 223 (H) 07/19/2016   TRIG (H) 07/19/2016     527.0 Triglyceride is over 400; calculations on Lipids are invalid.   HDL 27.40 (L) 07/19/2016   LDLDIRECT 106.0 07/19/2016   LDLCALC 81 01/02/2013   ALT 8 05/10/2017   AST 9 05/10/2017   NA 133 (L) 05/10/2017   K 4.4 05/10/2017   CL 98 05/10/2017   CREATININE 1.74 (H) 05/10/2017   BUN 26 (H) 05/10/2017   CO2 29 05/10/2017   TSH 1.16 07/19/2016   INR 0.95 01/28/2014   HGBA1C 11.8 08/09/2017   MICROALBUR 0.9 07/19/2016    No results found.  Assessment & Plan:   There are no diagnoses linked to this encounter. I have changed Katherine Herrera's levothyroxine. I am also having her maintain her vitamin B-12, cholecalciferol, lidocaine, Albuterol Sulfate, aspirin, glucose blood, accu-chek softclix, B-D INS SYR  ULTRAFINE 1CC/31G, diclofenac sodium, clonazePAM, clotrimazole-betamethasone, ONETOUCH DELICA LANCETS 95Q, ondansetron, polyethylene glycol powder, bisacodyl, ezetimibe, rOPINIRole, gabapentin, oxyCODONE, insulin regular, insulin NPH Human, lamoTRIgine, lisinopril, Insulin Syringe-Needle U-100, bisacodyl, and baclofen.  Meds ordered this encounter  Medications  . baclofen (LIORESAL) 10 MG tablet    Sig: TAKE 2 TABLETS BY MOUTH TWO TIMES DAILY    Dispense:  360 tablet    Refill:  3  . levothyroxine (SYNTHROID, LEVOTHROID) 75 MCG tablet    Sig: Take 1 tablet (75 mcg total) by mouth daily.    Dispense:  90 tablet    Refill:  3     Follow-up: No Follow-up on file.  Walker Kehr, MD

## 2017-10-06 NOTE — Assessment & Plan Note (Signed)
Off Dapsone

## 2017-10-06 NOTE — Assessment & Plan Note (Signed)
Humulin R and N

## 2017-10-06 NOTE — Assessment & Plan Note (Signed)
Levothroid 

## 2017-10-06 NOTE — Addendum Note (Signed)
Addended by: Cassandria Anger on: 10/06/2017 04:21 PM   Modules accepted: Orders

## 2017-10-13 ENCOUNTER — Ambulatory Visit: Payer: Medicare Other | Admitting: Podiatry

## 2017-10-13 ENCOUNTER — Encounter: Payer: Self-pay | Admitting: Podiatry

## 2017-10-13 DIAGNOSIS — L97521 Non-pressure chronic ulcer of other part of left foot limited to breakdown of skin: Secondary | ICD-10-CM | POA: Diagnosis not present

## 2017-10-13 DIAGNOSIS — L97511 Non-pressure chronic ulcer of other part of right foot limited to breakdown of skin: Secondary | ICD-10-CM

## 2017-10-15 NOTE — Progress Notes (Signed)
  Subjective:  Patient ID: Katherine Herrera, female    DOB: 13-Feb-1966,  MRN: [id number]  Chief Complaint  Patient presents with  . Foot Ulcer    Follow up ulcerations bilateral  "They look the same to me"   51 y.o. female returns for wound care. Believes the wound to be be about the same. Missed her last appointment. Denies N/V/F/Ch.   Objective:  There were no vitals filed for this visit. General AA&O x3. Normal mood and affect.  Vascular Foot warm to touch.  Neurologic Sensation grossly diminished.  Dermatologic (Wound) Wound Location: Lt. foot 5th met head Wound Measurement: 0.5 x 0.5 x 0.1 Wound Base: Granular/Healthy Peri-wound: Calloused Exudate: None: wound tissue dry  Wound progress: No Change since last check.  Wound Location: Rt. foot 5th met base Wound Measurement: 0.4 x 0.3 x 0.1 Wound Base: Granular/Healthy Peri-wound: Calloused Exudate: None: wound tissue dry  Wound progress: No Change since last check.  Orthopedic: No pain to palpation either foot.   Assessment & Plan:  Patient was evaluated and treated and all questions answered.  Ulcer bilateral feet -Debridement as below. -Dressed with DSD. -Patient to be fitted for DM shoes to help offload deformities.  Procedure: Selective Debridement of Wound Rationale: Removal of devitalized tissue from the wound to promote healing.  Pre-Debridement Wound Measurements: 0.5 x 0.5 x 0.1 L, 0.4 x 0.3 x 0.1 R Post-Debridement Wound Measurements: same as pre-debridement. Type of Debridement: Selective Tissue Removed: Devitalized soft-tissue Instrumentation: 3-0 mm dermal curette Dressing: Dry, sterile, compression dressing. Disposition: Patient tolerated procedure well. Patient to return in 1 week for follow-up.  Return in about 3 weeks (around 11/03/2017) for Wound Care.

## 2017-10-18 ENCOUNTER — Ambulatory Visit: Payer: Medicare Other | Admitting: Orthotics

## 2017-10-18 DIAGNOSIS — E1140 Type 2 diabetes mellitus with diabetic neuropathy, unspecified: Secondary | ICD-10-CM

## 2017-10-18 DIAGNOSIS — L97509 Non-pressure chronic ulcer of other part of unspecified foot with unspecified severity: Secondary | ICD-10-CM

## 2017-10-18 DIAGNOSIS — E0842 Diabetes mellitus due to underlying condition with diabetic polyneuropathy: Secondary | ICD-10-CM

## 2017-10-18 DIAGNOSIS — M14671 Charcot's joint, right ankle and foot: Secondary | ICD-10-CM

## 2017-10-18 NOTE — Progress Notes (Signed)
Patient came in today for fitting and eval for diabetic shoes: Patient' doctor here is March Rummage, PCP is Renato Shin.  Patient presents with DM2, HT, hx ulcer 5th met base right, Pes Cavus deformity  Patient was measured with brannock 95M device and cast in foam for custom inserts.  Patient chose Apex [id number]

## 2017-11-03 ENCOUNTER — Ambulatory Visit (INDEPENDENT_AMBULATORY_CARE_PROVIDER_SITE_OTHER): Payer: Medicare Other | Admitting: Podiatry

## 2017-11-03 DIAGNOSIS — L97509 Non-pressure chronic ulcer of other part of unspecified foot with unspecified severity: Secondary | ICD-10-CM

## 2017-11-13 ENCOUNTER — Encounter: Payer: Self-pay | Admitting: Endocrinology

## 2017-11-13 ENCOUNTER — Ambulatory Visit (INDEPENDENT_AMBULATORY_CARE_PROVIDER_SITE_OTHER): Payer: Medicare Other | Admitting: Endocrinology

## 2017-11-13 VITALS — BP 95/67 | HR 89 | Wt 231.2 lb

## 2017-11-13 DIAGNOSIS — E1140 Type 2 diabetes mellitus with diabetic neuropathy, unspecified: Secondary | ICD-10-CM | POA: Diagnosis not present

## 2017-11-13 LAB — POCT GLYCOSYLATED HEMOGLOBIN (HGB A1C): Hemoglobin A1C: 10.3

## 2017-11-13 MED ORDER — INSULIN NPH ISOPHANE & REGULAR (70-30) 100 UNIT/ML ~~LOC~~ SUSP: SUBCUTANEOUS | 11 refills | Status: DC

## 2017-11-13 NOTE — Progress Notes (Signed)
Subjective:    Patient ID: Katherine Herrera, female    DOB: December 06, 1965, 52 y.o.   MRN: [id number]  HPI Pt returns for f/u of diabetes mellitus: DM type: Insulin-requiring type 2.  Dx'ed: 2006.   Complications: polyneuropathy, renal failure and foot ulcers.  Therapy: insulin since 2012.   GDM: never. DKA: never.   Severe hypoglycemia: last episode was in late 2013.   Pancreatitis: never.   Other: she takes multiple daily injections; she takes human insulin, for cost reasons; she has long duration of action of insulin.   Interval history: Pt says she misses approx 3-4 insulin doses per week.  no cbg record, but states cbg's are highest at lunch, and lowest at HS. pt states she feels well in general.   Past Medical History:  Diagnosis Date  . Anxiety   . COPD (chronic obstructive pulmonary disease) ([place])   . DM (diabetes mellitus) ([place])   . Gout    possible  . Hyperlipidemia   . Hypothyroidism   . LBP (low back pain)    DR Patrice Paradise  . Migraine   . Peripheral neuropathy   . Syncope    poss conversion disorder vs psychogenic seizures  . Tachycardia    diet coke overuse?  Marland Kitchen Tenosynovitis of foot and ankle 09/03/2013  . TTS (tarsal tunnel syndrome) 2010   Left Foot [facility] in [place]  . Vitamin D deficiency     Past Surgical History:  Procedure Laterality Date  . ABDOMINAL HYSTERECTOMY    . CARPAL TUNNEL RELEASE  2010   LEFT  . CERVICAL FUSION     C4-5    Social History   Socioeconomic History  . Marital status: Married    Spouse name: Not on file  . Number of children: Not on file  . Years of education: Not on file  . Highest education level: Not on file  Social Needs  . Financial resource strain: Not on file  . Food insecurity - worry: Not on file  . Food insecurity - inability: Not on file  . Transportation needs - medical: Not on file  . Transportation needs - non-medical: Not on file  Occupational History  . Not on file  Tobacco Use  . Smoking status: Current  Every Day Smoker    Packs/day: 1.00    Last attempt to quit: 12/01/2009    Years since quitting: 7.9  . Smokeless tobacco: Never Used  Substance and Sexual Activity  . Alcohol use: No    Alcohol/week: 0.0 oz  . Drug use: No  . Sexual activity: Yes  Other Topics Concern  . Not on file  Social History Narrative   GYN - Dr Radene Knee      FAMILY HISTORY   Lung cancer   Mother w/alcoholism      Married 3d GS due Nov   Diet Coke: 10 cans a day      Regular exercise - NO      Former smoker 2009 restared 2010 stopped 12/2009    Current Outpatient Medications on File Prior to Visit  Medication Sig Dispense Refill  . Albuterol Sulfate (PROAIR RESPICLICK) 008 (90 BASE) MCG/ACT AEPB Inhale 1-2 puffs into the lungs 4 (four) times daily as needed. 1 each 5  . aspirin (BAYER ASPIRIN) 325 MG tablet Take 1 tablet (325 mg total) by mouth daily. 100 tablet 3  . B-D INS SYR ULTRAFINE 1CC/31G 31G X 5/16" 1 ML MISC USE AS DIRECTED 100 each 0  .  baclofen (LIORESAL) 10 MG tablet TAKE 2 TABLETS BY MOUTH TWO TIMES DAILY 360 tablet 3  . BISACODYL 5 MG EC tablet TAKE TWO TABLETS BY MOUTH EVERY DAY 30 tablet 5  . Cholecalciferol (VITAMIN D3) 1000 UNIT tablet Take 2,000 Units by mouth daily.      . clonazePAM (KLONOPIN) 0.5 MG tablet TAKE ONE TABLET BY MOUTH 2 TIMES A DAY AS NEEDED FOR ANXIETY 60 tablet 2  . clotrimazole-betamethasone (LOTRISONE) cream Apply 1 application topically 2 (two) times daily. 45 g 1  . diclofenac sodium (VOLTAREN) 1 % GEL Apply 2 g topically 4 (four) times daily. 100 g 6  . ezetimibe (ZETIA) 10 MG tablet Take 1 tablet (10 mg total) by mouth daily. 30 tablet 11  . gabapentin (NEURONTIN) 800 MG tablet TAKE 1 TABLET FOUR TIMES  DAILY 360 tablet 3  . glucose blood (ONETOUCH VERIO) test strip Use to check blood sugar 2 times per day. 100 each 12  . Insulin Syringe-Needle U-100 (SURE COMFORT INSULIN SYRINGE) 31G X 5/16" 0.5 ML MISC USE AS DIRECTED WITH INSULIN 4 TIMES A DAY 400 each 2  .  lamoTRIgine (LAMICTAL) 25 MG tablet TAKE 1 TABLET BY MOUTH TWO  TIMES DAILY 180 tablet 1  . Lancet Devices (ACCU-CHEK SOFTCLIX) lancets Test two times daily 100 each 12  . levothyroxine (SYNTHROID, LEVOTHROID) 75 MCG tablet Take 1 tablet (75 mcg total) by mouth daily. 90 tablet 3  . lidocaine (XYLOCAINE) 5 % ointment Apply 1 application topically as needed. 35.44 g 0  . lisinopril (PRINIVIL,ZESTRIL) 10 MG tablet TAKE ONE TABLET BY MOUTH EVERY DAY 30 tablet 11  . ondansetron (ZOFRAN ODT) 4 MG disintegrating tablet Take 1 tablet (4 mg total) by mouth every 8 (eight) hours as needed for nausea or vomiting. 21 tablet 0  . ONETOUCH DELICA LANCETS 32D MISC TEST TWO TIMES DAILY 200 each 2  . oxyCODONE (ROXICODONE) 15 MG immediate release tablet Take 1 tablet (15 mg total) by mouth every 6 (six) hours as needed for pain. Please fill on or after 12/14/17. 120 tablet 0  . polyethylene glycol powder (GLYCOLAX/MIRALAX) powder TAKE 17 GRAMS BY MOUTH EVERY 12 HOURS AS NEEDED 500 g 5  . rOPINIRole (REQUIP) 0.5 MG tablet TAKE ONE TABLET BY MOUTH AT BEDTIME 30 tablet 5  . vitamin B-12 (CYANOCOBALAMIN) 1000 MCG tablet Take 1,000 mcg by mouth daily.       No current facility-administered medications on file prior to visit.     Allergies  Allergen Reactions  . Hydromorphone Hcl Itching  . Cephalexin Nausea And Vomiting  . Lovastatin Other (See Comments)    Other reaction(s): Unknown (See Comments) Possible myalgia Unknown   . Meperidine Rash  . Metformin And Related Nausea And Vomiting  . Rosuvastatin Calcium Other (See Comments)    Other reaction(s): Myalgia Other reaction(s): Other myalgia myalgia  . Sulfamethoxazole-Trimethoprim     Other reaction(s): Unknown DILI, pancreatitis  . Crestor [Rosuvastatin Calcium]     myalgia  . Metformin Nausea And Vomiting  . Niacin Other (See Comments)    Unknown   . Meperidine Hcl Rash  . Paroxetine Other (See Comments)    Other reaction(s): Unknown (See  Comments) unknown Unknown     Family History  Problem Relation Age of Onset  . Diabetes Mother   . Hypertension Mother   . Cancer Father 62       ? bone cancer  . [name] Brother     BP 95/67 (BP Location:  Left Arm, Patient Position: Sitting, Cuff Size: Normal)   Pulse 89   Wt 231 lb 3.2 oz (104.9 kg)   SpO2 97%   BMI 33.65 kg/m   Review of Systems She denies hypoglycemia.      Objective:   Physical Exam VITAL SIGNS:  See vs page GENERAL: no distress Pulses: dorsalis pedis intact bilat.   MSK: no deformity of the feet CV: 1+ bilat leg edema Skin:  no open ulcer on the feet, but there are multiple healed ulcers.  normal color and temp on the feet. Neuro: sensation is intact to touch on the feet, but decreased from normal.   Ext: There is bilateral onychomycosis of the toenails  Lab Results  Component Value Date   HGBA1C 10.3 11/13/2017       Assessment & Plan:  Insulin-requiring type 2 DM: ongoing poor glycemic control Noncompliance with cbg recording and insulin: she needs a simpler regimen  Patient Instructions  check your blood sugar twice a day.  vary the time of day when you check, between before the 3 meals, and at bedtime.  also check if you have symptoms of your blood sugar being too high or too low.  please keep a record of the readings and bring it to your next appointment here.  please call us sooner if your blood sugar goes below 70, or if it stays over 200.   I have sent a prescription to your pharmacy, to change both insulins to "70/30," 60 units with breakfast, and 35 units with supper.  Please call or message [place] next week, to tell us how the blood sugar is doing.   please come back for a follow-up appointment in 2 months.

## 2017-11-13 NOTE — Patient Instructions (Addendum)
check your blood sugar twice a day.  vary the time of day when you check, between before the 3 meals, and at bedtime.  also check if you have symptoms of your blood sugar being too high or too low.  please keep a record of the readings and bring it to your next appointment here.  please call us sooner if your blood sugar goes below 70, or if it stays over 200.   I have sent a prescription to your pharmacy, to change both insulins to "70/30," 60 units with breakfast, and 35 units with supper.  Please call or message [place] next week, to tell us how the blood sugar is doing.   please come back for a follow-up appointment in 2 months.

## 2017-11-16 ENCOUNTER — Telehealth: Payer: Self-pay

## 2017-11-16 ENCOUNTER — Ambulatory Visit: Payer: Medicare Other | Admitting: Orthotics

## 2017-11-16 NOTE — Telephone Encounter (Signed)
Patient calling stating Dr. March Rummage is her foot doctor and his office has sent paperwork to be filled out for diabetic shoes and they were not filled out correctly- patient states Dr. Loanne Drilling put on the form that she does not need something that her foot doctor is saying she does need please call the patient to resolve this issue she needs the paperwork asap because she had to cancel her appointment today for foot doctor because paperwork has not been done- she stated she is in a lot of pain

## 2017-11-16 NOTE — Telephone Encounter (Signed)
The form was filled out correctly, and I have nothing to add

## 2017-11-16 NOTE — Telephone Encounter (Signed)
I called and left patient VM regarding note below. I stated that she could call back with further questions.

## 2017-11-21 ENCOUNTER — Telehealth: Payer: Self-pay | Admitting: Endocrinology

## 2017-11-21 NOTE — Telephone Encounter (Signed)
Patient is calling on the status of a form that need to be filled out concerning her diabetic shoes fax to Dr March Rummage office.

## 2017-11-23 ENCOUNTER — Telehealth: Payer: Self-pay | Admitting: Podiatry

## 2017-11-23 NOTE — Telephone Encounter (Signed)
Pt stated her dr office is suppose to be faxing 2 papers over for dr Loanne Drilling to fill out and fax back for her diabetic shoes. She is checking on status  Please advise

## 2017-11-23 NOTE — Telephone Encounter (Signed)
Spoke with Dr March Rummage and we are going to fax to Dr Alain Marion since that who signed for the shoes last year.  I have notified pts husband and he said thank you and that we are awesome.

## 2017-11-23 NOTE — Telephone Encounter (Signed)
Pt has called several times about diabetic paperwork and I have faxed it to Dr Loanne Drilling several times trying to get the second page filled out. I got the first page only. [employer] has also faxed it 6 times with the last 3 times only needing the 2nd page and has not gotten a response. I notified pt we still have not gotten it.

## 2017-11-28 ENCOUNTER — Telehealth: Payer: Self-pay | Admitting: Internal Medicine

## 2017-11-28 NOTE — Telephone Encounter (Signed)
Pt notified, husband picked up form

## 2017-11-28 NOTE — Telephone Encounter (Signed)
Copied from [place]. Topic: General - Other >> Nov 28, 2017  1:59 PM Yvette Rack wrote: Reason for CRM: patient calling to see if Dr Alain Marion has received paper work that was faxed  from Dr March Rummage for shoes  >> Nov 28, 2017  3:14 PM Cleaster Corin, NT wrote: patient calling to see if Dr Alain Marion has received paper work that was faxed from Dr March Rummage for shoes pt. Can be reached at [phone] Pt. Said husband can come in and bring paper work but needs Dr. To sign today but let her know that Dr. Lynnda Child problaby be with pt. sbut ok to drop papers off.

## 2017-12-06 NOTE — Telephone Encounter (Signed)
Please advise on below and if you will fill out her form from [facility]

## 2017-12-06 NOTE — Telephone Encounter (Signed)
Form has been done, and I have nothing to add.  This appears to be a request to sign my agreement with the other Dr's findings.  This is declined.

## 2017-12-08 ENCOUNTER — Encounter: Payer: Self-pay | Admitting: Podiatry

## 2017-12-08 ENCOUNTER — Ambulatory Visit (INDEPENDENT_AMBULATORY_CARE_PROVIDER_SITE_OTHER): Payer: Medicare Other | Admitting: Podiatry

## 2017-12-08 ENCOUNTER — Ambulatory Visit (INDEPENDENT_AMBULATORY_CARE_PROVIDER_SITE_OTHER): Payer: Medicare Other | Admitting: Orthotics

## 2017-12-08 DIAGNOSIS — L84 Corns and callosities: Secondary | ICD-10-CM

## 2017-12-08 DIAGNOSIS — L97509 Non-pressure chronic ulcer of other part of unspecified foot with unspecified severity: Secondary | ICD-10-CM | POA: Diagnosis not present

## 2017-12-08 DIAGNOSIS — M14671 Charcot's joint, right ankle and foot: Secondary | ICD-10-CM

## 2017-12-08 DIAGNOSIS — E1140 Type 2 diabetes mellitus with diabetic neuropathy, unspecified: Secondary | ICD-10-CM | POA: Diagnosis not present

## 2017-12-24 NOTE — Progress Notes (Signed)

## 2017-12-27 ENCOUNTER — Other Ambulatory Visit: Payer: Self-pay | Admitting: Internal Medicine

## 2017-12-27 NOTE — Telephone Encounter (Signed)
Routing to dr plotnikov, please advise, thanks 

## 2018-01-05 ENCOUNTER — Ambulatory Visit: Payer: Medicare Other | Admitting: Podiatry

## 2018-01-05 ENCOUNTER — Encounter: Payer: Self-pay | Admitting: Internal Medicine

## 2018-01-05 ENCOUNTER — Ambulatory Visit (INDEPENDENT_AMBULATORY_CARE_PROVIDER_SITE_OTHER): Payer: Medicare Other | Admitting: Internal Medicine

## 2018-01-05 DIAGNOSIS — E1140 Type 2 diabetes mellitus with diabetic neuropathy, unspecified: Secondary | ICD-10-CM

## 2018-01-05 DIAGNOSIS — M5440 Lumbago with sciatica, unspecified side: Secondary | ICD-10-CM | POA: Diagnosis not present

## 2018-01-05 DIAGNOSIS — F17200 Nicotine dependence, unspecified, uncomplicated: Secondary | ICD-10-CM

## 2018-01-05 DIAGNOSIS — E538 Deficiency of other specified B group vitamins: Secondary | ICD-10-CM | POA: Diagnosis not present

## 2018-01-05 DIAGNOSIS — L959 Vasculitis limited to the skin, unspecified: Secondary | ICD-10-CM | POA: Diagnosis not present

## 2018-01-05 MED ORDER — OXYCODONE HCL 15 MG PO TABS: 15.0000 mg | ORAL_TABLET | Freq: Four times a day (QID) | ORAL | 0 refills | Status: DC | PRN

## 2018-01-05 NOTE — Assessment & Plan Note (Addendum)
Humulin R and N - d/c 2019 on 70/30 insulin

## 2018-01-05 NOTE — Assessment & Plan Note (Signed)
On B12 

## 2018-01-05 NOTE — Assessment & Plan Note (Signed)
Oxycodone prn UDS  Potential benefits of a long term opioids use as well as potential risks (i.e. addiction risk, apnea etc) and complications (i.e. Somnolence, constipation and others) were explained to the patient and were aknowledged.

## 2018-01-05 NOTE — Assessment & Plan Note (Signed)
Abx, pain meds  Potential benefits of a long term opioids use as well as potential risks (i.e. addiction risk, apnea etc) and complications (i.e. Somnolence, constipation and others) were explained to the patient and were aknowledged.

## 2018-01-05 NOTE — Assessment & Plan Note (Signed)
0.5 ppd smoker - discussed

## 2018-01-05 NOTE — Progress Notes (Signed)
Subjective:  Patient ID: Katherine Herrera, female    DOB: [birth year]  Age: 52 y.o. MRN: [id number]  CC: No chief complaint on file.   HPI Katherine Herrera presents for DM, HTN, chronic pain  Outpatient Medications Prior to Visit  Medication Sig Dispense Refill  . Albuterol Sulfate (PROAIR RESPICLICK) 387 (90 BASE) MCG/ACT AEPB Inhale 1-2 puffs into the lungs 4 (four) times daily as needed. 1 each 5  . aspirin (BAYER ASPIRIN) 325 MG tablet Take 1 tablet (325 mg total) by mouth daily. 100 tablet 3  . B-D INS SYR ULTRAFINE 1CC/31G 31G X 5/16" 1 ML MISC USE AS DIRECTED 100 each 0  . baclofen (LIORESAL) 10 MG tablet TAKE 2 TABLETS BY MOUTH TWO TIMES DAILY 360 tablet 3  . BISACODYL 5 MG EC tablet TAKE TWO TABLETS BY MOUTH EVERY DAY 30 tablet 5  . Cholecalciferol (VITAMIN D3) 1000 UNIT tablet Take 2,000 Units by mouth daily.      . clonazePAM (KLONOPIN) 0.5 MG tablet TAKE ONE TABLET BY MOUTH 2 TIMES A DAY AS NEEDED FOR ANXIETY 60 tablet 3  . clotrimazole-betamethasone (LOTRISONE) cream Apply 1 application topically 2 (two) times daily. 45 g 1  . diclofenac sodium (VOLTAREN) 1 % GEL Apply 2 g topically 4 (four) times daily. 100 g 6  . ezetimibe (ZETIA) 10 MG tablet Take 1 tablet (10 mg total) by mouth daily. 30 tablet 11  . gabapentin (NEURONTIN) 800 MG tablet TAKE 1 TABLET FOUR TIMES  DAILY 360 tablet 3  . glucose blood (ONETOUCH VERIO) test strip Use to check blood sugar 2 times per day. 100 each 12  . insulin NPH-regular Human (NOVOLIN 70/30) (70-30) 100 UNIT/ML injection 60 units with breakfast, and 35 units with supper, and syringes 30 mL 11  . Insulin Syringe-Needle U-100 (SURE COMFORT INSULIN SYRINGE) 31G X 5/16" 0.5 ML MISC USE AS DIRECTED WITH INSULIN 4 TIMES A DAY 400 each 2  . lamoTRIgine (LAMICTAL) 25 MG tablet TAKE 1 TABLET BY MOUTH TWO  TIMES DAILY 180 tablet 1  . Lancet Devices (ACCU-CHEK SOFTCLIX) lancets Test two times daily 100 each 12  . levothyroxine (SYNTHROID, LEVOTHROID) 75  MCG tablet Take 1 tablet (75 mcg total) by mouth daily. 90 tablet 3  . lidocaine (XYLOCAINE) 5 % ointment Apply 1 application topically as needed. 35.44 g 0  . lisinopril (PRINIVIL,ZESTRIL) 10 MG tablet TAKE ONE TABLET BY MOUTH EVERY DAY 30 tablet 11  . ondansetron (ZOFRAN ODT) 4 MG disintegrating tablet Take 1 tablet (4 mg total) by mouth every 8 (eight) hours as needed for nausea or vomiting. 21 tablet 0  . ONETOUCH DELICA LANCETS 56E MISC TEST TWO TIMES DAILY 200 each 2  . oxyCODONE (ROXICODONE) 15 MG immediate release tablet Take 1 tablet (15 mg total) by mouth every 6 (six) hours as needed for pain. Please fill on or after 12/14/17. 120 tablet 0  . polyethylene glycol powder (GLYCOLAX/MIRALAX) powder TAKE 17 GRAMS BY MOUTH EVERY 12 HOURS AS NEEDED 500 g 5  . rOPINIRole (REQUIP) 0.5 MG tablet TAKE ONE TABLET BY MOUTH AT BEDTIME 30 tablet 5  . vitamin B-12 (CYANOCOBALAMIN) 1000 MCG tablet Take 1,000 mcg by mouth daily.       No facility-administered medications prior to visit.     ROS Review of Systems  Constitutional: Positive for fatigue. Negative for activity change, appetite change, chills and unexpected weight change.  HENT: Negative for congestion, mouth sores and sinus pressure.   Eyes: Negative  for visual disturbance.  Respiratory: Negative for cough and chest tightness.   Gastrointestinal: Negative for abdominal pain and nausea.  Genitourinary: Negative for difficulty urinating, frequency and vaginal pain.  Musculoskeletal: Positive for arthralgias, back pain and gait problem.  Skin: Negative for pallor and rash.  Neurological: Negative for dizziness, tremors, weakness, numbness and headaches.  Psychiatric/Behavioral: Positive for dysphoric mood. Negative for confusion, sleep disturbance and suicidal ideas. The patient is nervous/anxious.     Objective:  BP 108/64 (BP Location: Left Arm, Patient Position: Sitting, Cuff Size: Large)   Pulse 76   Temp 98.3 F (36.8 C) (Oral)    Ht 5' 9.5" (1.765 m)   Wt 231 lb (104.8 kg)   SpO2 99%   BMI 33.62 kg/m   BP Readings from Last 3 Encounters:  01/05/18 108/64  11/13/17 95/67  10/06/17 118/74    Wt Readings from Last 3 Encounters:  01/05/18 231 lb (104.8 kg)  11/13/17 231 lb 3.2 oz (104.9 kg)  10/06/17 216 lb (98 kg)    Physical Exam  Constitutional: She appears well-developed. No distress.  HENT:  Head: Normocephalic.  Right Ear: External ear normal.  Left Ear: External ear normal.  Nose: Nose normal.  Mouth/Throat: Oropharynx is clear and moist.  Eyes: Conjunctivae are normal. Pupils are equal, round, and reactive to light. Right eye exhibits no discharge. Left eye exhibits no discharge.  Neck: Normal range of motion. Neck supple. No JVD present. No tracheal deviation present. No thyromegaly present.  Cardiovascular: Normal rate, regular rhythm and normal heart sounds.  Pulmonary/Chest: No stridor. No respiratory distress. She has no wheezes.  Abdominal: Soft. Bowel sounds are normal. She exhibits no distension and no mass. There is no tenderness. There is no rebound and no guarding.  Musculoskeletal: She exhibits tenderness. She exhibits no edema.  Lymphadenopathy:    She has no cervical adenopathy.  Neurological: She displays normal reflexes. No cranial nerve deficit. She exhibits normal muscle tone. Coordination normal.  Skin: No rash noted. No erythema.  Psychiatric: She has a normal mood and affect. Her behavior is normal. Judgment and thought content normal.  LE w/trace edema Obese  Lab Results  Component Value Date   WBC 10.5 05/10/2017   HGB 13.5 05/10/2017   HCT 40.5 05/10/2017   PLT 546.0 (H) 05/10/2017   GLUCOSE 137 (H) 05/10/2017   CHOL 223 (H) 07/19/2016   TRIG (H) 07/19/2016    527.0 Triglyceride is over 400; calculations on Lipids are invalid.   HDL 27.40 (L) 07/19/2016   LDLDIRECT 106.0 07/19/2016   LDLCALC 81 01/02/2013   ALT 8 05/10/2017   AST 9 05/10/2017   NA 133 (L)  05/10/2017   K 4.4 05/10/2017   CL 98 05/10/2017   CREATININE 1.74 (H) 05/10/2017   BUN 26 (H) 05/10/2017   CO2 29 05/10/2017   TSH 0.62 10/06/2017   INR 0.95 01/28/2014   HGBA1C 10.3 11/13/2017   MICROALBUR 0.9 07/19/2016    No results found.  Assessment & Plan:   There are no diagnoses linked to this encounter. I am having Katherine Herrera maintain her vitamin B-12, cholecalciferol, lidocaine, Albuterol Sulfate, aspirin, glucose blood, accu-chek softclix, B-D INS SYR ULTRAFINE 1CC/31G, diclofenac sodium, clotrimazole-betamethasone, ONETOUCH DELICA LANCETS 47Q, ondansetron, polyethylene glycol powder, bisacodyl, ezetimibe, rOPINIRole, gabapentin, lamoTRIgine, lisinopril, Insulin Syringe-Needle U-100, baclofen, levothyroxine, oxyCODONE, insulin NPH-regular Human, and clonazePAM.  No orders of the defined types were placed in this encounter.    Follow-up: No Follow-up on file.  Alex  Plotnikov, MD

## 2018-01-10 ENCOUNTER — Ambulatory Visit: Payer: Medicare Other | Admitting: Endocrinology

## 2018-01-16 ENCOUNTER — Ambulatory Visit: Payer: Medicare Other | Admitting: Family Medicine

## 2018-01-16 DIAGNOSIS — Z0289 Encounter for other administrative examinations: Secondary | ICD-10-CM

## 2018-01-16 NOTE — Progress Notes (Deleted)
Katherine Herrera - 52 y.o. female MRN [id number]  Date of birth: Nov 07, 1965  SUBJECTIVE:  Including CC & ROS.  No chief complaint on file.   Katherine Herrera is a 52 y.o. female that is  ***.  ***   Review of Systems  HISTORY: Past Medical, Surgical, Social, and Family History Reviewed & Updated per EMR.   Pertinent Historical Findings include:  Past Medical History:  Diagnosis Date  . Anxiety   . COPD (chronic obstructive pulmonary disease) ([place])   . DM (diabetes mellitus) ([place])   . Gout    possible  . Hyperlipidemia   . Hypothyroidism   . LBP (low back pain)    DR Patrice Paradise  . Migraine   . Peripheral neuropathy   . Syncope    poss conversion disorder vs psychogenic seizures  . Tachycardia    diet coke overuse?  Marland Kitchen Tenosynovitis of foot and ankle 09/03/2013  . TTS (tarsal tunnel syndrome) 2010   Left Foot [facility] in [place]  . Vitamin D deficiency     Past Surgical History:  Procedure Laterality Date  . ABDOMINAL HYSTERECTOMY    . CARPAL TUNNEL RELEASE  2010   LEFT  . CERVICAL FUSION     C4-5    Allergies  Allergen Reactions  . Hydromorphone Hcl Itching  . Cephalexin Nausea And Vomiting  . Lovastatin Other (See Comments)    Other reaction(s): Unknown (See Comments) Possible myalgia Unknown   . Meperidine Rash  . Metformin And Related Nausea And Vomiting  . Rosuvastatin Calcium Other (See Comments)    Other reaction(s): Myalgia Other reaction(s): Other myalgia myalgia  . Sulfamethoxazole-Trimethoprim     Other reaction(s): Unknown DILI, pancreatitis  . Crestor [Rosuvastatin Calcium]     myalgia  . Metformin Nausea And Vomiting  . Niacin Other (See Comments)    Unknown   . Meperidine Hcl Rash  . Paroxetine Other (See Comments)    Other reaction(s): Unknown (See Comments) unknown Unknown     Family History  Problem Relation Age of Onset  . Diabetes Mother   . Hypertension Mother   . Cancer Father 18       ? bone cancer  . [name] Brother       Social History   Socioeconomic History  . Marital status: Married    Spouse name: Not on file  . Number of children: Not on file  . Years of education: Not on file  . Highest education level: Not on file  Social Needs  . Financial resource strain: Not on file  . Food insecurity - worry: Not on file  . Food insecurity - inability: Not on file  . Transportation needs - medical: Not on file  . Transportation needs - non-medical: Not on file  Occupational History  . Not on file  Tobacco Use  . Smoking status: Current Every Day Smoker    Packs/day: 1.00    Last attempt to quit: 12/01/2009    Years since quitting: 8.1  . Smokeless tobacco: Never Used  Substance and Sexual Activity  . Alcohol use: No    Alcohol/week: 0.0 oz  . Drug use: No  . Sexual activity: Yes  Other Topics Concern  . Not on file  Social History Narrative   GYN - Dr Radene Knee      FAMILY HISTORY   Lung cancer   Mother w/alcoholism      Married 3d GS due Nov   Diet Coke: 10 cans a day  Regular exercise - NO      Former smoker 2009 restared 2010 stopped 12/2009     PHYSICAL EXAM:  VS: There were no vitals taken for this visit. Physical Exam Gen: NAD, alert, cooperative with exam, well-appearing ENT: normal lips, normal nasal mucosa,  Eye: normal EOM, normal conjunctiva and lids CV:  no edema, +2 pedal pulses   Resp: no accessory muscle use, non-labored,  GI: no masses or tenderness, no hernia  Skin: no rashes, no areas of induration  Neuro: normal tone, normal sensation to touch Psych:  normal insight, alert and oriented MSK:  ***      ASSESSMENT & PLAN:   No problem-specific Assessment & Plan notes found for this encounter.

## 2018-01-25 ENCOUNTER — Ambulatory Visit: Payer: Medicare Other | Admitting: Podiatry

## 2018-01-25 DIAGNOSIS — L97521 Non-pressure chronic ulcer of other part of left foot limited to breakdown of skin: Secondary | ICD-10-CM

## 2018-01-25 DIAGNOSIS — S90822A Blister (nonthermal), left foot, initial encounter: Secondary | ICD-10-CM | POA: Diagnosis not present

## 2018-01-25 DIAGNOSIS — L97511 Non-pressure chronic ulcer of other part of right foot limited to breakdown of skin: Secondary | ICD-10-CM

## 2018-01-25 NOTE — Progress Notes (Signed)
  Subjective:  Patient ID: Katherine Herrera, female    DOB: [birth year],  MRN: [id number]  No chief complaint on file.  52 y.o. female returns for wound care.  Reports new lesion to the bottom of the left foot that she believes from her diabetic shoes.  States that she went back to her [employer] after she noticed the lesion come up in her left foot.  Objective:  There were no vitals filed for this visit. General AA&O x3. Normal mood and affect.  Vascular Foot warm to touch.  Neurologic Sensation grossly diminished.  Dermatologic (Wound) Wound Location: Lt. foot 5th met head Wound Measurement: 6x1.5 post-debridement Wound Base: Granular/Healthy Peri-wound: Calloused Exudate: None: wound tissue dry  Bulla with dark brown liquid without any warmth erythema or signs of acute infection likely hemorrhagic fluid.   Wound Location: Rt. foot 5th met base Wound Measurement: 1x0.5 post-debridement Wound Base: Granular/Healthy Peri-wound: Calloused Exudate: None: wound tissue dry   Orthopedic: No pain to palpation either foot.   Assessment & Plan:  Patient was evaluated and treated and all questions answered.  Ulcer bilateral feet -Debridement as below. -Bulla incised as below -Dressed with medihoney and Band-Aid right, medihoney and dry sterile dressing left -We will monitor for signs of recurrence and adjust diabetic shoes if needed.  Procedure: Excisional Debridement of Wound Rationale: Removal of non-viable soft tissue from the wound to promote healing.  Anesthesia: none Pre-Debridement Wound Measurements: Overlying hyperkeratosis Post-Debridement Wound Measurements: 6x1.5 post-debridement, 1x0.5 post-debridement Type of Debridement: Excisional Tissue Removed: Non-viable soft tissue Depth of Debridement: subq Instrumentation: 312 blade, tissue nipper  T states that echnique: Sharp excisional debridement to bleeding, viable wound base.  Dressing: Dry, sterile, compression  dressing. Disposition: Patient tolerated procedure well. Patient to return in 1 week for follow-up.  Procedure: I&D bulla left foot Anesthesia: none Instrumentation: 312 blade Technique: Following skin prep, the bulla was incised with a 312 blade.  Dark thick brown fluid was collected.  Fluid was non-odorous.  Likely hemorrhagic fluid. Dressing: Dry, sterile, compression dressing. Disposition: Patient tolerated procedure well. Patient to return in 1 week for follow-up.   Return in about 1 week (around 02/01/2018) for Post-op.

## 2018-02-02 ENCOUNTER — Ambulatory Visit: Payer: Medicare Other | Admitting: Podiatry

## 2018-02-02 DIAGNOSIS — L97421 Non-pressure chronic ulcer of left heel and midfoot limited to breakdown of skin: Secondary | ICD-10-CM

## 2018-02-02 DIAGNOSIS — E08621 Diabetes mellitus due to underlying condition with foot ulcer: Secondary | ICD-10-CM | POA: Diagnosis not present

## 2018-02-02 DIAGNOSIS — L97429 Non-pressure chronic ulcer of left heel and midfoot with unspecified severity: Secondary | ICD-10-CM | POA: Diagnosis not present

## 2018-02-02 DIAGNOSIS — E11621 Type 2 diabetes mellitus with foot ulcer: Secondary | ICD-10-CM | POA: Diagnosis not present

## 2018-02-20 ENCOUNTER — Other Ambulatory Visit: Payer: Self-pay | Admitting: Internal Medicine

## 2018-02-27 NOTE — Progress Notes (Signed)
  Subjective:  Patient ID: Katherine Herrera, female    DOB: 1965-11-20,  MRN: [id number]  Chief Complaint  Patient presents with  . Wound Check    1 week check - looks great       52 y.o. female returns for wound care. States that the wounds are looking much better. Denies new issues.  Objective:  There were no vitals filed for this visit. General AA&O x3. Normal mood and affect.  Vascular Foot warm to touch.  Neurologic Sensation grossly diminished.  Dermatologic (Wound) Wound Location: Lt. foot 5th met head Wound Measurement: 1x1 post-debridement Wound Base: Granular/Healthy Peri-wound: Calloused Exudate: None: wound tissue dry   Wound Location: Rt. foot 5th met base Wound Measurement: epithelialized Wound Base: Granular/Healthy Peri-wound: Calloused Exudate: None: wound tissue dry     Orthopedic: No pain to palpation either foot.   Assessment & Plan:  Patient was evaluated and treated and all questions answered.  Ulcer bilateral feet -Debridement L foot as as below. -Dressed with silvadene L. No dressing R.  Procedure: Excisional Debridement of Wound Rationale: Removal of non-viable soft tissue from the wound to promote healing.  Anesthesia: none  Post-Debridement Wound Measurements: 1x1  Type of Debridement: Excisional Tissue Removed: Non-viable soft tissue Depth of Debridement: subq Instrumentation: 312 blade, tissue nipper. Technique: Sharp excisional debridement to bleeding, viable wound base.  Dressing: Dry, sterile, compression dressing. Disposition: Patient tolerated procedure well. Patient to return in 1 week for follow-up.  Return in about 1 month (around 03/02/2018) for Wound Care.

## 2018-03-01 ENCOUNTER — Ambulatory Visit (INDEPENDENT_AMBULATORY_CARE_PROVIDER_SITE_OTHER): Payer: Medicare Other

## 2018-03-01 ENCOUNTER — Ambulatory Visit (INDEPENDENT_AMBULATORY_CARE_PROVIDER_SITE_OTHER): Payer: Medicare Other | Admitting: Podiatry

## 2018-03-01 DIAGNOSIS — L97511 Non-pressure chronic ulcer of other part of right foot limited to breakdown of skin: Secondary | ICD-10-CM | POA: Diagnosis not present

## 2018-03-01 DIAGNOSIS — E11621 Type 2 diabetes mellitus with foot ulcer: Secondary | ICD-10-CM

## 2018-03-01 DIAGNOSIS — L97429 Non-pressure chronic ulcer of left heel and midfoot with unspecified severity: Secondary | ICD-10-CM

## 2018-03-01 NOTE — Progress Notes (Signed)
  Subjective:  Patient ID: Katherine Herrera, female    DOB: May 15, 1966,  MRN: [id number]  Chief Complaint  Patient presents with  . Diabetic Ulcer    Left foot looks angry, is painful   52 y.o. female returns for wound care.States that the left foot wound is painful.  Right foot wound remains healed.  Has been wearing her surgical shoes just replaced the insert states that they wear out very quickly from pressure  Objective:  There were no vitals filed for this visit. General AA&O x3. Normal mood and affect.  Vascular Foot warm to touch.  Neurologic Sensation grossly diminished.  Dermatologic (Wound) Wound Location: Lt. foot 5th met head Wound Measurement: 1.5x1.5 post-debridement Wound Base: Granular/Healthy Peri-wound: Calloused Exudate: None: wound tissue dry  R 5th metatarsal base wound healed with thin overlying hyperkeratosis. No open ulcer.  Orthopedic: Pain to palpation L 5th met head.   Assessment & Plan:  Patient was evaluated and treated and all questions answered.  Ulcer R Foot -Healed. No recurrence. -Continue surgical shoe.  Ulcer L Foot -Continued callus and ulcer formation despite DM shoes. -X-rays taken reviewed for surveillance no underlying osseous erosion -Debridement as below -Discussed the patient that due to the chronicity of the ulcer and failure to leave the ulcer with aggressive offloading would benefit from excision of the fifth metatarsal head.  Patient verbalized understanding and agrees to proceed.  All risk benefits alternatives been to patient no guarantees given.  We will plan for a date in the next 2 weeks  Procedure: Excisional Debridement of Wound Rationale: Removal of non-viable soft tissue from the wound to promote healing.  Anesthesia: none Pre-Debridement Wound Measurements: overlying hyperkeratosis  Post-Debridement Wound Measurements: 1.5 cm x 1.5 cm x 0.1 cm  Type of Debridement: Excisional Tissue Removed: Non-viable soft  tissue Depth of Debridement: subq Instrumentation: 3-0 mm dermal curette Technique: Sharp excisional debridement to bleeding, viable wound base.  Dressing: Dry, sterile, compression dressing. Disposition: Patient tolerated procedure well. Patient to return in 1 week for follow-up.  Return for post op.

## 2018-03-01 NOTE — Patient Instructions (Signed)
Pre-Operative Instructions  Congratulations, you have decided to take an important step towards improving your quality of life.  You can be assured that the doctors and staff at [facility] will be with you every step of the way.  Here are some important things you should know:  1. Plan to be at the surgery center/hospital at least 1 (one) hour prior to your scheduled time, unless otherwise directed by the surgical center/hospital staff.  You must have a responsible adult accompany you, remain during the surgery and drive you home.  Make sure you have directions to the surgical center/hospital to ensure you arrive on time. 2. If you are having surgery at [facility] or [facility] hospitals, you will need a copy of your medical history and physical form from your family physician within one month prior to the date of surgery. We will give you a form for your primary physician to complete.  3. We make every effort to accommodate the date you request for surgery.  However, there are times where surgery dates or times have to be moved.  We will contact you as soon as possible if a change in schedule is required.   4. No aspirin/ibuprofen for one week before surgery.  If you are on aspirin, any non-steroidal anti-inflammatory medications (Mobic, Aleve, Ibuprofen) should not be taken seven (7) days prior to your surgery.  You make take Tylenol for pain prior to surgery.  5. Medications - If you are taking daily heart and blood pressure medications, seizure, reflux, allergy, asthma, anxiety, pain or diabetes medications, make sure you notify the surgery center/hospital before the day of surgery so they can tell you which medications you should take or avoid the day of surgery. 6. No food or drink after midnight the night before surgery unless directed otherwise by surgical center/hospital staff. 7. No alcoholic beverages 24-hours prior to surgery.  No smoking 24-hours prior or 24-hours after  surgery. 8. Wear loose pants or shorts. They should be loose enough to fit over bandages, boots, and casts. 9. Don't wear slip-on shoes. Sneakers are preferred. 10. Bring your boot with you to the surgery center/hospital.  Also bring crutches or a walker if your physician has prescribed it for you.  If you do not have this equipment, it will be provided for you after surgery. 11. If you have not been contacted by the surgery center/hospital by the day before your surgery, call to confirm the date and time of your surgery. 12. Leave-time from work may vary depending on the type of surgery you have.  Appropriate arrangements should be made prior to surgery with your employer. 13. Prescriptions will be provided immediately following surgery by your doctor.  Fill these as soon as possible after surgery and take the medication as directed. Pain medications will not be refilled on weekends and must be approved by the doctor. 14. Remove nail polish on the operative foot and avoid getting pedicures prior to surgery. 15. Wash the night before surgery.  The night before surgery wash the foot and leg well with water and the antibacterial soap provided. Be sure to pay special attention to beneath the toenails and in between the toes.  Wash for at least three (3) minutes. Rinse thoroughly with water and dry well with a towel.  Perform this wash unless told not to do so by your physician.  Enclosed: 1 Ice pack (please put in freezer the night before surgery)   1 Hibiclens skin cleaner     Pre-op instructions  If you have any questions regarding the instructions, please do not hesitate to call our office.  [place]: [address], [place], [place] [address] -- [phone]  [place]: [address] [place], [place] [address] -- [phone]  [place]: [address]  [place], [place] [address] -- [phone]  [place]: [address], [address], [place], [place] [address] -- [phone]  Website:  [email or web address] 

## 2018-03-02 ENCOUNTER — Telehealth: Payer: Self-pay | Admitting: *Deleted

## 2018-03-02 NOTE — Telephone Encounter (Signed)
"  I am calling to schedule my surgery.  I was given the option to do it on a Wednesday or Thursday of next week.  I'd like to do it on a Thursday."  We have you scheduled for surgery on Thursday of next week.  "Okay great, what time will I need to be there?"  We have you scheduled for 12:30 pm but you will most likely need to be there a hour early.  Someone from the surgical center will call you a day or two prior to your surgery date and give you all the pertinent information you need.

## 2018-03-08 ENCOUNTER — Other Ambulatory Visit: Payer: Self-pay | Admitting: Podiatry

## 2018-03-08 ENCOUNTER — Encounter: Payer: Self-pay | Admitting: Podiatry

## 2018-03-08 DIAGNOSIS — M216X2 Other acquired deformities of left foot: Secondary | ICD-10-CM | POA: Diagnosis not present

## 2018-03-08 DIAGNOSIS — L97529 Non-pressure chronic ulcer of other part of left foot with unspecified severity: Secondary | ICD-10-CM | POA: Diagnosis not present

## 2018-03-08 DIAGNOSIS — E11621 Type 2 diabetes mellitus with foot ulcer: Secondary | ICD-10-CM | POA: Diagnosis not present

## 2018-03-08 DIAGNOSIS — M899 Disorder of bone, unspecified: Secondary | ICD-10-CM | POA: Diagnosis not present

## 2018-03-08 DIAGNOSIS — E7800 Pure hypercholesterolemia, unspecified: Secondary | ICD-10-CM | POA: Diagnosis not present

## 2018-03-08 DIAGNOSIS — S92355A Nondisplaced fracture of fifth metatarsal bone, left foot, initial encounter for closed fracture: Secondary | ICD-10-CM | POA: Diagnosis not present

## 2018-03-08 DIAGNOSIS — M722 Plantar fascial fibromatosis: Secondary | ICD-10-CM | POA: Diagnosis not present

## 2018-03-08 MED ORDER — OXYCODONE-ACETAMINOPHEN 10-325 MG PO TABS: 1.0000 | ORAL_TABLET | ORAL | 0 refills | Status: DC | PRN

## 2018-03-08 MED ORDER — PROMETHAZINE HCL 25 MG PO TABS: 25.0000 mg | ORAL_TABLET | Freq: Three times a day (TID) | ORAL | 0 refills | Status: DC | PRN

## 2018-03-08 MED ORDER — CLINDAMYCIN HCL 150 MG PO CAPS: 150.0000 mg | ORAL_CAPSULE | Freq: Two times a day (BID) | ORAL | 0 refills | Status: DC

## 2018-03-09 ENCOUNTER — Telehealth: Payer: Self-pay

## 2018-03-09 NOTE — Telephone Encounter (Signed)
Called patient and left a message advising we were just calling to see how she is doing after surgery. Advised to call back with any questions or problems and to keep her scheduled follow up appointment.

## 2018-03-09 NOTE — Progress Notes (Signed)
DOS  03/08/18  Left fifth metatarsal head excision and wound closure.

## 2018-03-14 ENCOUNTER — Encounter: Payer: Self-pay | Admitting: Podiatry

## 2018-03-15 ENCOUNTER — Other Ambulatory Visit: Payer: Medicare Other

## 2018-03-16 ENCOUNTER — Encounter: Payer: Self-pay | Admitting: Podiatry

## 2018-03-16 ENCOUNTER — Ambulatory Visit (INDEPENDENT_AMBULATORY_CARE_PROVIDER_SITE_OTHER): Payer: Medicare Other

## 2018-03-16 ENCOUNTER — Ambulatory Visit (INDEPENDENT_AMBULATORY_CARE_PROVIDER_SITE_OTHER): Payer: Medicare Other | Admitting: Podiatry

## 2018-03-16 VITALS — BP 138/79 | HR 78 | Temp 96.0°F

## 2018-03-16 DIAGNOSIS — L97429 Non-pressure chronic ulcer of left heel and midfoot with unspecified severity: Secondary | ICD-10-CM

## 2018-03-16 DIAGNOSIS — E11621 Type 2 diabetes mellitus with foot ulcer: Secondary | ICD-10-CM

## 2018-03-16 DIAGNOSIS — L97421 Non-pressure chronic ulcer of left heel and midfoot limited to breakdown of skin: Secondary | ICD-10-CM | POA: Diagnosis not present

## 2018-03-16 DIAGNOSIS — E08621 Diabetes mellitus due to underlying condition with foot ulcer: Secondary | ICD-10-CM

## 2018-03-16 MED ORDER — OXYCODONE-ACETAMINOPHEN 10-325 MG PO TABS: 1.0000 | ORAL_TABLET | ORAL | 0 refills | Status: DC | PRN

## 2018-03-18 NOTE — Progress Notes (Signed)
  Subjective:  Patient ID: Katherine Herrera, female    DOB: May 15, 1966,  MRN: [id number]  Chief Complaint  Patient presents with  . Routine Post Op    dos 05.09.2019 Metatarsal Head Res. 5th Lt; Poss. Excision of Pressure Ulcer Lt, " my foot throbs at times, I have had some nausea and heartburn"    DOS: 03/08/2018 Procedure: Left fifth metatarsal head resection, excision of pressure ulcer.  52 y.o. female returns for post-op check. Denies N/V/F/Ch.  States she is doing okay.  Reports nausea and heartburn.  Has been taking the pain meds as needed but needs a refill today of the antibiotic.  Denies other pedal issues  Objective:   General AA&O x3. Normal mood and affect.  Vascular Foot warm and well perfused.  Neurologic Gross sensation intact.  Dermatologic Skin healing well without signs of infection. Skin edges well coapted without signs of infection.  Orthopedic: Tenderness to palpation noted about the surgical site.    Assessment & Plan:  Patient was evaluated and treated and all questions answered.  S/p Left fifth metatarsal head resection -Progressing as expected post-operatively. -Sutures: intact. -Medications refilled: Percocet -Foot redressed.   -X-rays taken reviewed consistent with postop state   Return in about 1 week (around 03/23/2018) for Post-op.

## 2018-03-23 ENCOUNTER — Ambulatory Visit (INDEPENDENT_AMBULATORY_CARE_PROVIDER_SITE_OTHER): Payer: Self-pay | Admitting: Podiatry

## 2018-03-23 ENCOUNTER — Encounter: Payer: Self-pay | Admitting: Podiatry

## 2018-03-23 DIAGNOSIS — E11621 Type 2 diabetes mellitus with foot ulcer: Secondary | ICD-10-CM

## 2018-03-23 DIAGNOSIS — L97429 Non-pressure chronic ulcer of left heel and midfoot with unspecified severity: Secondary | ICD-10-CM

## 2018-03-23 NOTE — Progress Notes (Signed)
Subjective:   Patient ID: Katherine Herrera, female   DOB: 52 y.o.   MRN: [id number]   HPI Patient presents stating that she is doing very well post surgery walking without pain and has minimal swelling   ROS      Objective:  Physical Exam  Neurovascular status unchanged with patient's left foot healing well stitches intact wound edges well coapted no drainage no erythema no edema noted     Assessment:  Doing well post fifth metatarsal head resection left along with excision of plantar ulceration     Plan:  Reapplied sterile dressing advised on continued elevation compression and reappoint 1 week for suture removal with Dr. March Rummage

## 2018-03-30 ENCOUNTER — Ambulatory Visit (INDEPENDENT_AMBULATORY_CARE_PROVIDER_SITE_OTHER): Payer: Medicare Other | Admitting: Podiatry

## 2018-03-30 DIAGNOSIS — Z9889 Other specified postprocedural states: Secondary | ICD-10-CM | POA: Diagnosis not present

## 2018-03-30 NOTE — Progress Notes (Signed)
  Subjective:  Patient ID: Katherine Herrera, female    DOB: [birth year],  MRN: [id number]  Chief Complaint  Patient presents with  . Routine Post Op    POV # 2 - possible suture removal    DOS: 03/08/2018 Procedure: Left fifth metatarsal head resection, excision of pressure ulcer.  52 y.o. female returns for post-op check. Denies N/V/F/Ch.  States she is having some burning pain but otherwise doing well.  Objective:   General AA&O x3. Normal mood and affect.  Vascular Foot warm and well perfused.  Neurologic Gross sensation intact.  Dermatologic Skin healing well without signs of infection. Skin edges well coapted without signs of infection.  Orthopedic: Tenderness to palpation noted about the surgical site.    Assessment & Plan:  Patient was evaluated and treated and all questions answered.  S/p Left fifth metatarsal head resection -Progressing as expected post-operatively. -Sutures: Removed. -Medications refilled: None -Foot redressed.  -Surgical shoe dispensed today.  Patient to wear boot for 1 more week then transition to surgical shoe   Return in about 3 weeks (around 04/20/2018).

## 2018-04-18 ENCOUNTER — Other Ambulatory Visit: Payer: Self-pay | Admitting: Internal Medicine

## 2018-04-18 ENCOUNTER — Other Ambulatory Visit: Payer: Self-pay | Admitting: Endocrinology

## 2018-04-20 ENCOUNTER — Encounter: Payer: Medicare Other | Admitting: Podiatry

## 2018-04-20 ENCOUNTER — Ambulatory Visit (INDEPENDENT_AMBULATORY_CARE_PROVIDER_SITE_OTHER): Payer: Medicare Other | Admitting: Podiatry

## 2018-04-20 DIAGNOSIS — B351 Tinea unguium: Secondary | ICD-10-CM

## 2018-04-20 DIAGNOSIS — E1142 Type 2 diabetes mellitus with diabetic polyneuropathy: Secondary | ICD-10-CM | POA: Diagnosis not present

## 2018-04-20 DIAGNOSIS — Z9889 Other specified postprocedural states: Secondary | ICD-10-CM

## 2018-04-20 DIAGNOSIS — Q828 Other specified congenital malformations of skin: Secondary | ICD-10-CM

## 2018-04-20 NOTE — Progress Notes (Signed)
  Subjective:  Patient ID: Katherine Herrera, female    DOB: 10/16/1966,  MRN: [id number]  Chief Complaint  Patient presents with  . Routine Post Op    doing great    DOS: 03/08/2018 Procedure: Left fifth metatarsal head resection, excision of pressure ulcer.  52 y.o. female returns for post-op check. Denies N/V/F/Ch.  States the pain is better than it was prior to surgery.  Has some callus formation on the right foot with some pain  Objective:   General AA&O x3. Normal mood and affect.  Vascular Foot warm and well perfused.  Neurologic Gross sensation intact.  Dermatologic  Skin well-healed.  Hyperkeratosis surrounding incision noted  Pre-ulcerative lesion right fifth met styloid process  Orthopedic: Tenderness to palpation noted about the surgical site.    Assessment & Plan:  Patient was evaluated and treated and all questions answered.  S/p Left fifth metatarsal head resection -Progressing as expected post-operatively. -Sutures: Removed. -Medications refilled: None -Foot redressed.  -Surgical shoe dispensed today.  Patient to wear boot for 1 more week then transition to surgical shoe  Right fifth metatarsal styloid process pre-ulcerative lesion -Debrided with 312 blade without incident  Procedure: Paring of Lesion Rationale: painful hyperkeratotic lesion Type of Debridement: manual, sharp debridement. Instrumentation: 312 blade Number of Lesions: 1   DM with diabetic peripheral neuropathy and onychomycosis -Nails debrided as below.  Procedure: Nail Debridement Rationale: Patient meets criteria for routine foot care due to DPN Type of Debridement: manual, sharp debridement. Instrumentation: Nail nipper, rotary burr. Number of Nails: 10   No follow-ups on file.

## 2018-04-27 ENCOUNTER — Ambulatory Visit (INDEPENDENT_AMBULATORY_CARE_PROVIDER_SITE_OTHER): Payer: Medicare Other | Admitting: Internal Medicine

## 2018-04-27 ENCOUNTER — Encounter: Payer: Self-pay | Admitting: Internal Medicine

## 2018-04-27 VITALS — BP 130/68 | HR 82 | Temp 98.2°F | Ht 69.5 in | Wt 221.0 lb

## 2018-04-27 DIAGNOSIS — E1140 Type 2 diabetes mellitus with diabetic neuropathy, unspecified: Secondary | ICD-10-CM

## 2018-04-27 DIAGNOSIS — M5440 Lumbago with sciatica, unspecified side: Secondary | ICD-10-CM | POA: Diagnosis not present

## 2018-04-27 DIAGNOSIS — E0842 Diabetes mellitus due to underlying condition with diabetic polyneuropathy: Secondary | ICD-10-CM | POA: Diagnosis not present

## 2018-04-27 DIAGNOSIS — L959 Vasculitis limited to the skin, unspecified: Secondary | ICD-10-CM | POA: Diagnosis not present

## 2018-04-27 MED ORDER — OXYCODONE HCL 15 MG PO TABS: 15.0000 mg | ORAL_TABLET | Freq: Four times a day (QID) | ORAL | 0 refills | Status: DC | PRN

## 2018-04-27 MED ORDER — LEVOTHYROXINE SODIUM 75 MCG PO TABS: 75.0000 ug | ORAL_TABLET | Freq: Every day | ORAL | 3 refills | Status: DC

## 2018-04-27 MED ORDER — OXYCODONE HCL 15 MG PO TABS
15.0000 mg | ORAL_TABLET | Freq: Four times a day (QID) | ORAL | 0 refills | Status: DC | PRN
Start: 2018-04-27 — End: 2018-04-27

## 2018-04-27 NOTE — Assessment & Plan Note (Signed)
In remission.

## 2018-04-27 NOTE — Addendum Note (Signed)
Addended by: Karren Cobble on: 04/27/2018 03:59 PM   Modules accepted: Orders

## 2018-04-27 NOTE — Assessment & Plan Note (Signed)
The pt wants to switch to Dr Chalmers Cater - will refer

## 2018-04-27 NOTE — Assessment & Plan Note (Signed)
Gabapentin

## 2018-04-27 NOTE — Assessment & Plan Note (Signed)
Oxycodone prn  Potential benefits of a long term opioids use as well as potential risks (i.e. addiction risk, apnea etc) and complications (i.e. Somnolence, constipation and others) were explained to the patient and were aknowledged. 

## 2018-04-27 NOTE — Progress Notes (Signed)
Subjective:  Patient ID: Katherine Herrera, female    DOB: April 20, 1966  Age: 52 y.o. MRN: [id number]  CC: No chief complaint on file.   HPI Katherine Herrera presents for chronic pain, anxiety, DM, hypothyroidism f/u   Outpatient Medications Prior to Visit  Medication Sig Dispense Refill  . Albuterol Sulfate (PROAIR RESPICLICK) 741 (90 BASE) MCG/ACT AEPB Inhale 1-2 puffs into the lungs 4 (four) times daily as needed. 1 each 5  . aspirin (BAYER ASPIRIN) 325 MG tablet Take 1 tablet (325 mg total) by mouth daily. 100 tablet 3  . B-D INS SYR ULTRAFINE 1CC/31G 31G X 5/16" 1 ML MISC USE AS DIRECTED 100 each 0  . baclofen (LIORESAL) 10 MG tablet TAKE 2 TABLETS BY MOUTH TWO TIMES DAILY 360 tablet 3  . BISACODYL 5 MG EC tablet TAKE TWO TABLETS BY MOUTH EVERY DAY 30 tablet 5  . Cholecalciferol (VITAMIN D3) 1000 UNIT tablet Take 2,000 Units by mouth daily.      . clindamycin (CLEOCIN) 150 MG capsule Take 1 capsule (150 mg total) by mouth 2 (two) times daily. 14 capsule 0  . clonazePAM (KLONOPIN) 0.5 MG tablet TAKE ONE TABLET BY MOUTH 2 TIMES A DAY AS NEEDED FOR ANXIETY 60 tablet 3  . clotrimazole-betamethasone (LOTRISONE) cream Apply 1 application topically 2 (two) times daily. 45 g 1  . diclofenac sodium (VOLTAREN) 1 % GEL Apply 2 g topically 4 (four) times daily. 100 g 6  . ezetimibe (ZETIA) 10 MG tablet Take 1 tablet (10 mg total) by mouth daily. 30 tablet 11  . gabapentin (NEURONTIN) 800 MG tablet TAKE 1 TABLET BY MOUTH FOUR TIMES DAILY 360 tablet 3  . glucose blood (ONETOUCH VERIO) test strip Use to check blood sugar 2 times per day. 100 each 12  . insulin NPH-regular Human (NOVOLIN 70/30) (70-30) 100 UNIT/ML injection 60 units with breakfast, and 35 units with supper, and syringes 30 mL 11  . Insulin Syringe-Needle U-100 (SURE COMFORT INSULIN SYRINGE) 31G X 5/16" 0.5 ML MISC USE AS DIRECTED WITH INSULIN 4 TIMES A DAY 400 each 2  . lamoTRIgine (LAMICTAL) 25 MG tablet TAKE 1 TABLET BY MOUTH TWO   TIMES DAILY 180 tablet 1  . Lancet Devices (ACCU-CHEK SOFTCLIX) lancets Test two times daily 100 each 12  . levothyroxine (SYNTHROID, LEVOTHROID) 75 MCG tablet Take 1 tablet (75 mcg total) by mouth daily. 90 tablet 3  . lidocaine (XYLOCAINE) 5 % ointment Apply 1 application topically as needed. 35.44 g 0  . lisinopril (PRINIVIL,ZESTRIL) 10 MG tablet TAKE ONE TABLET BY MOUTH EVERY DAY 30 tablet 11  . ondansetron (ZOFRAN ODT) 4 MG disintegrating tablet Take 1 tablet (4 mg total) by mouth every 8 (eight) hours as needed for nausea or vomiting. 21 tablet 0  . ONETOUCH DELICA LANCETS 28N MISC TEST TWO TIMES DAILY 200 each 2  . oxyCODONE (ROXICODONE) 15 MG immediate release tablet Take 1 tablet (15 mg total) by mouth every 6 (six) hours as needed for pain. Please fill on or after 03/13/18. 120 tablet 0  . oxyCODONE-acetaminophen (PERCOCET) 10-325 MG tablet Take 1 tablet by mouth every 4 (four) hours as needed for pain. 20 tablet 0  . polyethylene glycol powder (GLYCOLAX/MIRALAX) powder TAKE 17 GRAMS BY MOUTH EVERY 12 HOURS AS NEEDED 500 g 5  . promethazine (PHENERGAN) 25 MG tablet Take 1 tablet (25 mg total) by mouth every 8 (eight) hours as needed for nausea or vomiting. 20 tablet 0  . rOPINIRole (REQUIP)  0.5 MG tablet TAKE ONE TABLET BY MOUTH AT BEDTIME 30 tablet 5  . vitamin B-12 (CYANOCOBALAMIN) 1000 MCG tablet Take 1,000 mcg by mouth daily.       No facility-administered medications prior to visit.     ROS: Review of Systems  Constitutional: Negative for activity change, appetite change, chills, fatigue and unexpected weight change.  HENT: Negative for congestion, mouth sores and sinus pressure.   Eyes: Negative for visual disturbance.  Respiratory: Negative for cough and chest tightness.   Gastrointestinal: Negative for abdominal pain and nausea.  Genitourinary: Negative for difficulty urinating, frequency and vaginal pain.  Musculoskeletal: Positive for arthralgias, back pain and gait  problem.  Skin: Negative for pallor and rash.  Neurological: Negative for dizziness, tremors, weakness, numbness and headaches.  Psychiatric/Behavioral: Negative for confusion and sleep disturbance.    Objective:  BP 130/68 (BP Location: Left Arm, Patient Position: Sitting, Cuff Size: Large)   Pulse 82   Temp 98.2 F (36.8 C) (Oral)   Ht 5' 9.5" (1.765 m)   Wt 221 lb (100.2 kg)   SpO2 97%   BMI 32.17 kg/m   BP Readings from Last 3 Encounters:  04/27/18 130/68  03/16/18 138/79  01/05/18 108/64    Wt Readings from Last 3 Encounters:  04/27/18 221 lb (100.2 kg)  01/05/18 231 lb (104.8 kg)  11/13/17 231 lb 3.2 oz (104.9 kg)    Physical Exam  Constitutional: She appears well-developed. No distress.  HENT:  Head: Normocephalic.  Right Ear: External ear normal.  Left Ear: External ear normal.  Nose: Nose normal.  Mouth/Throat: Oropharynx is clear and moist.  Eyes: Pupils are equal, round, and reactive to light. Conjunctivae are normal. Right eye exhibits no discharge. Left eye exhibits no discharge.  Neck: Normal range of motion. Neck supple. No JVD present. No tracheal deviation present. No thyromegaly present.  Cardiovascular: Normal rate, regular rhythm and normal heart sounds.  Pulmonary/Chest: No stridor. No respiratory distress. She has no wheezes.  Abdominal: Soft. Bowel sounds are normal. She exhibits no distension and no mass. There is no tenderness. There is no rebound and no guarding.  Musculoskeletal: She exhibits tenderness. She exhibits no edema.  Lymphadenopathy:    She has no cervical adenopathy.  Neurological: She displays normal reflexes. No cranial nerve deficit. She exhibits normal muscle tone. Coordination normal.  Skin: No rash noted. No erythema.  Psychiatric: She has a normal mood and affect. Her behavior is normal. Judgment and thought content normal.  LS tender L foot in a surgical shoe  Lab Results  Component Value Date   WBC 10.5 05/10/2017    HGB 13.5 05/10/2017   HCT 40.5 05/10/2017   PLT 546.0 (H) 05/10/2017   GLUCOSE 137 (H) 05/10/2017   CHOL 223 (H) 07/19/2016   TRIG (H) 07/19/2016    527.0 Triglyceride is over 400; calculations on Lipids are invalid.   HDL 27.40 (L) 07/19/2016   LDLDIRECT 106.0 07/19/2016   LDLCALC 81 01/02/2013   ALT 8 05/10/2017   AST 9 05/10/2017   NA 133 (L) 05/10/2017   K 4.4 05/10/2017   CL 98 05/10/2017   CREATININE 1.74 (H) 05/10/2017   BUN 26 (H) 05/10/2017   CO2 29 05/10/2017   TSH 0.62 10/06/2017   INR 0.95 01/28/2014   HGBA1C 10.3 11/13/2017   MICROALBUR 0.9 07/19/2016    No results found.  Assessment & Plan:   There are no diagnoses linked to this encounter.   No orders of the  defined types were placed in this encounter.    Follow-up: No follow-ups on file.  Walker Kehr, MD

## 2018-05-18 ENCOUNTER — Encounter: Payer: Self-pay | Admitting: Podiatry

## 2018-05-18 ENCOUNTER — Ambulatory Visit (INDEPENDENT_AMBULATORY_CARE_PROVIDER_SITE_OTHER): Payer: Medicare Other | Admitting: Podiatry

## 2018-05-18 DIAGNOSIS — Q667 Congenital pes cavus: Secondary | ICD-10-CM | POA: Diagnosis not present

## 2018-05-18 DIAGNOSIS — Q6671 Congenital pes cavus, right foot: Secondary | ICD-10-CM

## 2018-05-18 DIAGNOSIS — R269 Unspecified abnormalities of gait and mobility: Secondary | ICD-10-CM | POA: Diagnosis not present

## 2018-05-20 NOTE — Progress Notes (Signed)
  Subjective:  Patient ID: Katherine Herrera, female    DOB: [birth year],  MRN: [id number]  Chief Complaint  Patient presents with  . Routine Post Op    DOS 03-08-18  Left fifth metatarsal head resection, excision of pressure ulcer   "Its doing fine. I've noticed that my right foot is rolling, you can see it on my shoe"    DOS: 03/08/2018 Procedure: Left fifth metatarsal head resection, excision of pressure ulcer.  52 y.o. female returns for post-op check. Denies N/V/F/Ch.  States that the left foot is doing fine.  Worried that her right foot is rolling.  States that she puts too much pressure on the outside of the foot.  States her shoe breakdown  Objective:   General AA&O x3. Normal mood and affect.  Vascular Foot warm and well perfused.  Neurologic Gross sensation intact.  Dermatologic  Skin well-healed.  Minimal hyperkeratosis  Pre-ulcerative lesion right fifth met styloid process  Orthopedic: Tenderness to palpation noted about the surgical site.    Assessment & Plan:  Patient was evaluated and treated and all questions answered.  S/p Left fifth metatarsal head resection -Progressing as expected post-operatively. -Skin well-healed. -Foot redressed.   Right fifth metatarsal styloid process pre-ulcerative lesion with cavus foot type. -No debridement performed today. -Would benefit from brace for right cavus foot deformity.  Will refer to orthotist for evaluation   Return in about 1 month (around 06/15/2018).

## 2018-05-24 ENCOUNTER — Ambulatory Visit: Payer: Medicare Other | Admitting: Orthotics

## 2018-05-24 DIAGNOSIS — L97429 Non-pressure chronic ulcer of left heel and midfoot with unspecified severity: Secondary | ICD-10-CM

## 2018-05-24 DIAGNOSIS — E11621 Type 2 diabetes mellitus with foot ulcer: Secondary | ICD-10-CM

## 2018-05-24 DIAGNOSIS — R269 Unspecified abnormalities of gait and mobility: Secondary | ICD-10-CM

## 2018-05-24 DIAGNOSIS — Z9889 Other specified postprocedural states: Secondary | ICD-10-CM

## 2018-05-24 NOTE — Progress Notes (Signed)
Patient presents today for evaluation/casting for AFO brace (r).   Patient has hx of the following conditions: Gait instability,  Ankle instabilty, Rigid RF Varus, Cavus, boney prominence lateral cuboid. Gait analysis done and patient displays abnormality of gait in both sagittial and frontal planes, and could benefit in aggressive ankle support.  Patient chose [place] brace w/ lace/speed laces.

## 2018-06-13 ENCOUNTER — Other Ambulatory Visit: Payer: Self-pay | Admitting: Internal Medicine

## 2018-06-17 NOTE — Progress Notes (Signed)
  Subjective:  Patient ID: Katherine Herrera, female    DOB: Oct 04, 1966,  MRN: [id number]  Chief Complaint  Patient presents with  . Foot Ulcer     1 week wound check, left   52 y.o. female returns for wound care. Believes the wound to be be about the same. Missed her last appointment. Denies N/V/F/Ch.   Objective:  There were no vitals filed for this visit. General AA&O x3. Normal mood and affect.  Vascular Foot warm to touch.  Neurologic Sensation grossly diminished.  Dermatologic (Wound) Wound Location: Lt. foot 5th met head Wound Measurement: 0.5 x 0.5 x 0.1 Wound Base: Granular/Healthy Peri-wound: Calloused Exudate: None: wound tissue dry  Wound progress: No Change since last check.  Wound Location: Rt. foot 5th met base Wound Measurement: 0.4 x 0.3 x 0.1 Wound Base: Granular/Healthy Peri-wound: Calloused Exudate: None: wound tissue dry  Wound progress: No Change since last check.  Orthopedic: No pain to palpation either foot.   Assessment & Plan:  Patient was evaluated and treated and all questions answered.  Ulcer bilateral feet -Debridement as below. -Dressed with DSD. -Patient to be fitted for DM shoes to help offload deformities.  Procedure: Selective Debridement of Wound Rationale: Removal of devitalized tissue from the wound to promote healing.  Pre-Debridement Wound Measurements: 0.5 x 0.5 x 0.1 L, 0.4 x 0.3 x 0.1 R Post-Debridement Wound Measurements: same as pre-debridement. Type of Debridement: Selective Tissue Removed: Devitalized soft-tissue Instrumentation: 3-0 mm dermal curette Dressing: Dry, sterile, compression dressing. Disposition: Patient tolerated procedure well. Patient to return in 1 week for follow-up.  No follow-ups on file.

## 2018-06-22 ENCOUNTER — Encounter: Payer: Medicare Other | Admitting: Podiatry

## 2018-07-11 NOTE — Progress Notes (Signed)
  Subjective:  Patient ID: Katherine Herrera, female    DOB: 09/12/1966,  MRN: [id number]  Chief Complaint  Patient presents with  . Foot Ulcer    Follow up ulcer sub 5th met base right   "Its been sore and swollen"   52 y.o. female returns for wound care.  States the wound is sore and swollen.  Objective:  There were no vitals filed for this visit. General AA&O x3. Normal mood and affect.  Vascular Foot warm to touch.  Neurologic Sensation grossly diminished.  Dermatologic (Wound) Wound Location: Lt. foot 5th met head Wound Measurement: 0.5 x 0.5 x 0.1 Wound Base: Granular/Healthy Peri-wound: Calloused Exudate: None: wound tissue dry  Wound progress: No Change since last check.  Wound Location: Rt. foot 5th met base Wound Measurement: 0.4 x 0.3 x 0.1 Wound Base: Granular/Healthy Peri-wound: Calloused Exudate: None: wound tissue dry  Wound progress: No Change since last check.  Orthopedic: No pain to palpation either foot.   Assessment & Plan:  Patient was evaluated and treated and all questions answered.  Ulcer bilateral feet -Debridement as below. -Dressed with DSD. -Patient to be fitted for DM shoes to help offload deformities.  Return in about 4 weeks (around 01/05/2018) for Foot Ulcer, right foot.

## 2018-07-12 DIAGNOSIS — R110 Nausea: Secondary | ICD-10-CM | POA: Diagnosis not present

## 2018-07-12 DIAGNOSIS — I4510 Unspecified right bundle-branch block: Secondary | ICD-10-CM | POA: Diagnosis not present

## 2018-07-12 DIAGNOSIS — R079 Chest pain, unspecified: Secondary | ICD-10-CM | POA: Diagnosis not present

## 2018-07-12 DIAGNOSIS — R072 Precordial pain: Secondary | ICD-10-CM | POA: Diagnosis not present

## 2018-07-12 DIAGNOSIS — R4020 Unspecified coma: Secondary | ICD-10-CM | POA: Diagnosis not present

## 2018-07-12 DIAGNOSIS — R55 Syncope and collapse: Secondary | ICD-10-CM | POA: Diagnosis not present

## 2018-07-12 DIAGNOSIS — R531 Weakness: Secondary | ICD-10-CM | POA: Diagnosis not present

## 2018-07-12 DIAGNOSIS — R002 Palpitations: Secondary | ICD-10-CM | POA: Diagnosis not present

## 2018-07-12 DIAGNOSIS — J9811 Atelectasis: Secondary | ICD-10-CM | POA: Diagnosis not present

## 2018-07-12 DIAGNOSIS — R0789 Other chest pain: Secondary | ICD-10-CM | POA: Diagnosis not present

## 2018-07-13 DIAGNOSIS — I4510 Unspecified right bundle-branch block: Secondary | ICD-10-CM | POA: Diagnosis not present

## 2018-07-26 ENCOUNTER — Ambulatory Visit (INDEPENDENT_AMBULATORY_CARE_PROVIDER_SITE_OTHER): Payer: Medicare Other | Admitting: Orthotics

## 2018-07-26 DIAGNOSIS — R269 Unspecified abnormalities of gait and mobility: Secondary | ICD-10-CM

## 2018-07-26 DIAGNOSIS — E1142 Type 2 diabetes mellitus with diabetic polyneuropathy: Secondary | ICD-10-CM

## 2018-07-26 DIAGNOSIS — L97429 Non-pressure chronic ulcer of left heel and midfoot with unspecified severity: Secondary | ICD-10-CM

## 2018-07-26 DIAGNOSIS — Q667 Congenital pes cavus: Secondary | ICD-10-CM

## 2018-07-26 DIAGNOSIS — E11621 Type 2 diabetes mellitus with foot ulcer: Secondary | ICD-10-CM

## 2018-07-26 DIAGNOSIS — Q6671 Congenital pes cavus, right foot: Secondary | ICD-10-CM

## 2018-07-26 NOTE — Progress Notes (Signed)
Patient came in today to pick up standard Afo brace.  Patient was evaluated for fit and function.   The brace fit very well and there were any complaints of the way it felt once donned.  The brace offered ankle stability in both saggital and coroneal planes.  Patient advised to always wear proper fitting shoes with brace. 

## 2018-07-30 ENCOUNTER — Ambulatory Visit (INDEPENDENT_AMBULATORY_CARE_PROVIDER_SITE_OTHER): Payer: Medicare Other | Admitting: Internal Medicine

## 2018-07-30 ENCOUNTER — Encounter: Payer: Self-pay | Admitting: Internal Medicine

## 2018-07-30 VITALS — BP 126/72 | HR 79 | Temp 98.2°F | Ht 69.5 in | Wt 225.0 lb

## 2018-07-30 DIAGNOSIS — R0789 Other chest pain: Secondary | ICD-10-CM

## 2018-07-30 DIAGNOSIS — R55 Syncope and collapse: Secondary | ICD-10-CM | POA: Insufficient documentation

## 2018-07-30 DIAGNOSIS — M5440 Lumbago with sciatica, unspecified side: Secondary | ICD-10-CM | POA: Diagnosis not present

## 2018-07-30 DIAGNOSIS — E1140 Type 2 diabetes mellitus with diabetic neuropathy, unspecified: Secondary | ICD-10-CM

## 2018-07-30 DIAGNOSIS — E538 Deficiency of other specified B group vitamins: Secondary | ICD-10-CM

## 2018-07-30 MED ORDER — OXYCODONE HCL 15 MG PO TABS: 15.0000 mg | ORAL_TABLET | Freq: Four times a day (QID) | ORAL | 0 refills | Status: DC | PRN

## 2018-07-30 NOTE — Assessment & Plan Note (Signed)
Oxycodone Rx UDS

## 2018-07-30 NOTE — Assessment & Plan Note (Signed)
Labs

## 2018-07-30 NOTE — Assessment & Plan Note (Signed)
On B12 

## 2018-07-30 NOTE — Assessment & Plan Note (Signed)
Remote Cardiology ref

## 2018-07-30 NOTE — Assessment & Plan Note (Signed)
?  vaso-vagal vs other S/p ER eval (-) Cardiology ref

## 2018-07-30 NOTE — Progress Notes (Signed)
Subjective:  Patient ID: Katherine Herrera, female    DOB: 10/24/1966  Age: 52 y.o. MRN: [id number]  CC: No chief complaint on file.   HPI Katherine Herrera presents for DM, chronic pain, LBP, neuropathy f/u. The pt passed out on 9/26 - went to [facility] ER by ambulance, later released  Outpatient Medications Prior to Visit  Medication Sig Dispense Refill  . Albuterol Sulfate (PROAIR RESPICLICK) 510 (90 BASE) MCG/ACT AEPB Inhale 1-2 puffs into the lungs 4 (four) times daily as needed. 1 each 5  . aspirin (BAYER ASPIRIN) 325 MG tablet Take 1 tablet (325 mg total) by mouth daily. 100 tablet 3  . B-D INS SYR ULTRAFINE 1CC/31G 31G X 5/16" 1 ML MISC USE AS DIRECTED 100 each 0  . baclofen (LIORESAL) 10 MG tablet TAKE 2 TABLETS BY MOUTH TWO TIMES DAILY 360 tablet 3  . BISACODYL 5 MG EC tablet TAKE TWO TABLETS BY MOUTH EVERY DAY 30 tablet 5  . Cholecalciferol (VITAMIN D3) 1000 UNIT tablet Take 2,000 Units by mouth daily.      . clonazePAM (KLONOPIN) 0.5 MG tablet TAKE ONE TABLET BY MOUTH 2 TIMES A DAY AS NEEDED FOR ANXIETY 60 tablet 3  . clotrimazole-betamethasone (LOTRISONE) cream Apply 1 application topically 2 (two) times daily. 45 g 1  . diclofenac sodium (VOLTAREN) 1 % GEL Apply 2 g topically 4 (four) times daily. 100 g 6  . ezetimibe (ZETIA) 10 MG tablet TAKE ONE TABLET BY MOUTH EVERY DAY 30 tablet 11  . gabapentin (NEURONTIN) 800 MG tablet TAKE 1 TABLET BY MOUTH FOUR TIMES DAILY 360 tablet 3  . glucose blood (ONETOUCH VERIO) test strip Use to check blood sugar 2 times per day. 100 each 12  . insulin NPH-regular Human (NOVOLIN 70/30) (70-30) 100 UNIT/ML injection 60 units with breakfast, and 35 units with supper, and syringes 30 mL 11  . Insulin Syringe-Needle U-100 (SURE COMFORT INSULIN SYRINGE) 31G X 5/16" 0.5 ML MISC USE AS DIRECTED WITH INSULIN 4 TIMES A DAY 400 each 2  . lamoTRIgine (LAMICTAL) 25 MG tablet TAKE 1 TABLET BY MOUTH TWO  TIMES DAILY 180 tablet 1  . Lancet Devices (ACCU-CHEK SOFTCLIX)  lancets Test two times daily 100 each 12  . levothyroxine (SYNTHROID, LEVOTHROID) 75 MCG tablet Take 1 tablet (75 mcg total) by mouth daily. 90 tablet 3  . lidocaine (XYLOCAINE) 5 % ointment Apply 1 application topically as needed. 35.44 g 0  . lisinopril (PRINIVIL,ZESTRIL) 10 MG tablet TAKE ONE TABLET BY MOUTH EVERY DAY 30 tablet 11  . ondansetron (ZOFRAN ODT) 4 MG disintegrating tablet Take 1 tablet (4 mg total) by mouth every 8 (eight) hours as needed for nausea or vomiting. 21 tablet 0  . ONETOUCH DELICA LANCETS 25E MISC TEST TWO TIMES DAILY 200 each 2  . oxyCODONE (ROXICODONE) 15 MG immediate release tablet Take 1 tablet (15 mg total) by mouth every 6 (six) hours as needed for pain. Please fill on or after 06/27/18. 120 tablet 0  . oxyCODONE (ROXICODONE) 15 MG immediate release tablet Take 1 tablet (15 mg total) by mouth every 6 (six) hours as needed for pain. Please fill on or after 05/27/18. 120 tablet 0  . polyethylene glycol powder (GLYCOLAX/MIRALAX) powder TAKE 17 GRAMS BY MOUTH EVERY 12 HOURS AS NEEDED 500 g 5  . promethazine (PHENERGAN) 25 MG tablet Take 1 tablet (25 mg total) by mouth every 8 (eight) hours as needed for nausea or vomiting. 20 tablet 0  . rOPINIRole (  REQUIP) 0.5 MG tablet TAKE ONE TABLET BY MOUTH AT BEDTIME 30 tablet 5  . vitamin B-12 (CYANOCOBALAMIN) 1000 MCG tablet Take 1,000 mcg by mouth daily.       No facility-administered medications prior to visit.     ROS: Review of Systems  Constitutional: Positive for fatigue. Negative for activity change, appetite change, chills and unexpected weight change.  HENT: Negative for congestion, mouth sores and sinus pressure.   Eyes: Negative for visual disturbance.  Respiratory: Negative for cough and chest tightness.   Cardiovascular: Positive for chest pain.  Gastrointestinal: Negative for abdominal pain and nausea.  Genitourinary: Negative for difficulty urinating, frequency and vaginal pain.  Musculoskeletal: Positive  for arthralgias, back pain and gait problem.  Skin: Negative for pallor and rash.  Neurological: Positive for syncope. Negative for dizziness, tremors, weakness, numbness and headaches.  Psychiatric/Behavioral: Positive for dysphoric mood. Negative for confusion, sleep disturbance and suicidal ideas. The patient is nervous/anxious.     Objective:  BP 126/72 (BP Location: Left Arm, Patient Position: Sitting, Cuff Size: Normal)   Pulse 79   Temp 98.2 F (36.8 C) (Oral)   Ht 5' 9.5" (1.765 m)   Wt 225 lb (102.1 kg)   SpO2 96%   BMI 32.75 kg/m   BP Readings from Last 3 Encounters:  07/30/18 126/72  04/27/18 130/68  03/16/18 138/79    Wt Readings from Last 3 Encounters:  07/30/18 225 lb (102.1 kg)  04/27/18 221 lb (100.2 kg)  01/05/18 231 lb (104.8 kg)    Physical Exam  Constitutional: She appears well-developed. No distress.  HENT:  Head: Normocephalic.  Right Ear: External ear normal.  Left Ear: External ear normal.  Nose: Nose normal.  Mouth/Throat: Oropharynx is clear and moist.  Eyes: Pupils are equal, round, and reactive to light. Conjunctivae are normal. Right eye exhibits no discharge. Left eye exhibits no discharge.  Neck: Normal range of motion. Neck supple. No JVD present. No tracheal deviation present. No thyromegaly present.  Cardiovascular: Normal rate, regular rhythm and normal heart sounds.  Pulmonary/Chest: No stridor. No respiratory distress. She has no wheezes.  Abdominal: Soft. Bowel sounds are normal. She exhibits no distension and no mass. There is no tenderness. There is no rebound and no guarding.  Musculoskeletal: She exhibits tenderness. She exhibits no edema.  Lymphadenopathy:    She has no cervical adenopathy.  Neurological: She displays normal reflexes. No cranial nerve deficit. She exhibits normal muscle tone. Coordination abnormal.  Skin: No rash noted. No erythema.  Psychiatric: She has a normal mood and affect. Her behavior is normal.  Judgment and thought content normal.  L foot surg shoe R foot brace  Lab Results  Component Value Date   WBC 10.5 05/10/2017   HGB 13.5 05/10/2017   HCT 40.5 05/10/2017   PLT 546.0 (H) 05/10/2017   GLUCOSE 137 (H) 05/10/2017   CHOL 223 (H) 07/19/2016   TRIG (H) 07/19/2016    527.0 Triglyceride is over 400; calculations on Lipids are invalid.   HDL 27.40 (L) 07/19/2016   LDLDIRECT 106.0 07/19/2016   LDLCALC 81 01/02/2013   ALT 8 05/10/2017   AST 9 05/10/2017   NA 133 (L) 05/10/2017   K 4.4 05/10/2017   CL 98 05/10/2017   CREATININE 1.74 (H) 05/10/2017   BUN 26 (H) 05/10/2017   CO2 29 05/10/2017   TSH 0.62 10/06/2017   INR 0.95 01/28/2014   HGBA1C 10.3 11/13/2017   MICROALBUR 0.9 07/19/2016    No results found.  Assessment & Plan:   There are no diagnoses linked to this encounter.   No orders of the defined types were placed in this encounter.    Follow-up: No follow-ups on file.  Walker Kehr, MD

## 2018-07-31 DIAGNOSIS — E7800 Pure hypercholesterolemia, unspecified: Secondary | ICD-10-CM | POA: Diagnosis not present

## 2018-07-31 DIAGNOSIS — E039 Hypothyroidism, unspecified: Secondary | ICD-10-CM | POA: Diagnosis not present

## 2018-07-31 DIAGNOSIS — E1140 Type 2 diabetes mellitus with diabetic neuropathy, unspecified: Secondary | ICD-10-CM | POA: Diagnosis not present

## 2018-07-31 DIAGNOSIS — I10 Essential (primary) hypertension: Secondary | ICD-10-CM | POA: Diagnosis not present

## 2018-07-31 DIAGNOSIS — Z23 Encounter for immunization: Secondary | ICD-10-CM | POA: Diagnosis not present

## 2018-07-31 DIAGNOSIS — E1165 Type 2 diabetes mellitus with hyperglycemia: Secondary | ICD-10-CM | POA: Diagnosis not present

## 2018-08-10 ENCOUNTER — Encounter: Payer: Medicare Other | Admitting: Podiatry

## 2018-08-17 ENCOUNTER — Encounter: Payer: Self-pay | Admitting: Cardiovascular Disease

## 2018-08-28 ENCOUNTER — Other Ambulatory Visit: Payer: Self-pay | Admitting: Internal Medicine

## 2018-08-28 NOTE — Telephone Encounter (Signed)
Oxycodone 15 mg IR refill Last Refill:07/30/18 #120  Last OV: 07/30/18 PCP: Dr Alain Marion Pharmacy:[facility]

## 2018-08-28 NOTE — Telephone Encounter (Signed)
Copied from [id number] [phone] [phone]. Topic: Quick Communication - See Telephone Encounter >> Aug 28, 2018 11:25 AM [name], NT wrote: CRM for notification. See Telephone encounter for: 08/28/18.  Patient is calling and requesting a refill on oxyCODONE (ROXICODONE) 15 MG immediate release tablet she states she usually gets 3 month supply but did not get the November prescription.   [place], [place] - [name] [place] [address] Phone: [phone] Fax: [phone]  Patient is calling back and states she is also needing a prescription for December as well as her next appointment is not until 10/29/18. Please advise.  Patient would like a call once this has been taken care of.

## 2018-08-29 MED ORDER — OXYCODONE HCL 15 MG PO TABS: 15.0000 mg | ORAL_TABLET | Freq: Four times a day (QID) | ORAL | 0 refills | Status: DC | PRN

## 2018-08-29 NOTE — Telephone Encounter (Signed)
Please advise, pt states she lost November RX

## 2018-09-05 ENCOUNTER — Ambulatory Visit: Payer: Medicare Other | Admitting: Cardiovascular Disease

## 2018-09-06 ENCOUNTER — Encounter: Payer: Medicare Other | Admitting: Podiatry

## 2018-09-12 ENCOUNTER — Other Ambulatory Visit: Payer: Self-pay

## 2018-09-12 NOTE — Patient Outreach (Signed)
[facility]) Care Management  09/12/2018  FLORRIE RAMIRES 14-Apr-1966 [id number]   Medication Adherence call to Mrs. Melynda Krzywicki left a message for patient to call back patient is due on Lisinopril 10 mg under [facility].   [place] Management Direct Dial [phone]  Fax [phone] [email or web address]

## 2018-09-17 ENCOUNTER — Other Ambulatory Visit: Payer: Self-pay | Admitting: Internal Medicine

## 2018-09-28 ENCOUNTER — Other Ambulatory Visit: Payer: Self-pay

## 2018-09-28 ENCOUNTER — Emergency Department (HOSPITAL_COMMUNITY): Payer: Medicare Other

## 2018-09-28 ENCOUNTER — Inpatient Hospital Stay (HOSPITAL_COMMUNITY)
Admission: EM | Admit: 2018-09-28 | Discharge: 2018-10-05 | DRG: 872 | Disposition: A | Discharge status: Home / Self Care | Payer: Medicare Other | Attending: Family Medicine | Admitting: Family Medicine

## 2018-09-28 ENCOUNTER — Encounter (HOSPITAL_COMMUNITY): Payer: Self-pay | Admitting: Emergency Medicine

## 2018-09-28 DIAGNOSIS — E1122 Type 2 diabetes mellitus with diabetic chronic kidney disease: Secondary | ICD-10-CM | POA: Diagnosis present

## 2018-09-28 DIAGNOSIS — R609 Edema, unspecified: Secondary | ICD-10-CM

## 2018-09-28 DIAGNOSIS — A409 Streptococcal sepsis, unspecified: Secondary | ICD-10-CM | POA: Diagnosis not present

## 2018-09-28 DIAGNOSIS — A419 Sepsis, unspecified organism: Secondary | ICD-10-CM | POA: Diagnosis not present

## 2018-09-28 DIAGNOSIS — J449 Chronic obstructive pulmonary disease, unspecified: Secondary | ICD-10-CM | POA: Diagnosis present

## 2018-09-28 DIAGNOSIS — F17210 Nicotine dependence, cigarettes, uncomplicated: Secondary | ICD-10-CM | POA: Diagnosis present

## 2018-09-28 DIAGNOSIS — E872 Acidosis: Secondary | ICD-10-CM | POA: Diagnosis not present

## 2018-09-28 DIAGNOSIS — R569 Unspecified convulsions: Secondary | ICD-10-CM | POA: Diagnosis not present

## 2018-09-28 DIAGNOSIS — F411 Generalized anxiety disorder: Secondary | ICD-10-CM | POA: Diagnosis present

## 2018-09-28 DIAGNOSIS — F419 Anxiety disorder, unspecified: Secondary | ICD-10-CM | POA: Diagnosis present

## 2018-09-28 DIAGNOSIS — R6520 Severe sepsis without septic shock: Secondary | ICD-10-CM | POA: Diagnosis not present

## 2018-09-28 DIAGNOSIS — L97512 Non-pressure chronic ulcer of other part of right foot with fat layer exposed: Secondary | ICD-10-CM | POA: Diagnosis not present

## 2018-09-28 DIAGNOSIS — E861 Hypovolemia: Secondary | ICD-10-CM | POA: Diagnosis not present

## 2018-09-28 DIAGNOSIS — E785 Hyperlipidemia, unspecified: Secondary | ICD-10-CM | POA: Diagnosis not present

## 2018-09-28 DIAGNOSIS — E871 Hypo-osmolality and hyponatremia: Secondary | ICD-10-CM | POA: Diagnosis not present

## 2018-09-28 DIAGNOSIS — E1140 Type 2 diabetes mellitus with diabetic neuropathy, unspecified: Secondary | ICD-10-CM | POA: Diagnosis present

## 2018-09-28 DIAGNOSIS — K5903 Drug induced constipation: Secondary | ICD-10-CM | POA: Diagnosis not present

## 2018-09-28 DIAGNOSIS — E039 Hypothyroidism, unspecified: Secondary | ICD-10-CM | POA: Diagnosis present

## 2018-09-28 DIAGNOSIS — N181 Chronic kidney disease, stage 1: Secondary | ICD-10-CM | POA: Diagnosis present

## 2018-09-28 DIAGNOSIS — G9009 Other idiopathic peripheral autonomic neuropathy: Secondary | ICD-10-CM | POA: Diagnosis not present

## 2018-09-28 DIAGNOSIS — L0390 Cellulitis, unspecified: Secondary | ICD-10-CM

## 2018-09-28 DIAGNOSIS — Z7982 Long term (current) use of aspirin: Secondary | ICD-10-CM | POA: Diagnosis not present

## 2018-09-28 DIAGNOSIS — Z79890 Hormone replacement therapy (postmenopausal): Secondary | ICD-10-CM | POA: Diagnosis not present

## 2018-09-28 DIAGNOSIS — E1142 Type 2 diabetes mellitus with diabetic polyneuropathy: Secondary | ICD-10-CM | POA: Diagnosis not present

## 2018-09-28 DIAGNOSIS — Z79899 Other long term (current) drug therapy: Secondary | ICD-10-CM | POA: Diagnosis not present

## 2018-09-28 DIAGNOSIS — Z833 Family history of diabetes mellitus: Secondary | ICD-10-CM

## 2018-09-28 DIAGNOSIS — L03116 Cellulitis of left lower limb: Secondary | ICD-10-CM

## 2018-09-28 DIAGNOSIS — Z794 Long term (current) use of insulin: Secondary | ICD-10-CM

## 2018-09-28 DIAGNOSIS — E860 Dehydration: Secondary | ICD-10-CM | POA: Diagnosis present

## 2018-09-28 DIAGNOSIS — R600 Localized edema: Secondary | ICD-10-CM | POA: Diagnosis not present

## 2018-09-28 DIAGNOSIS — N289 Disorder of kidney and ureter, unspecified: Secondary | ICD-10-CM

## 2018-09-28 DIAGNOSIS — G8929 Other chronic pain: Secondary | ICD-10-CM | POA: Diagnosis present

## 2018-09-28 DIAGNOSIS — E11621 Type 2 diabetes mellitus with foot ulcer: Secondary | ICD-10-CM | POA: Diagnosis present

## 2018-09-28 DIAGNOSIS — T402X5A Adverse effect of other opioids, initial encounter: Secondary | ICD-10-CM | POA: Diagnosis not present

## 2018-09-28 DIAGNOSIS — Z791 Long term (current) use of non-steroidal anti-inflammatories (NSAID): Secondary | ICD-10-CM

## 2018-09-28 DIAGNOSIS — E1151 Type 2 diabetes mellitus with diabetic peripheral angiopathy without gangrene: Secondary | ICD-10-CM | POA: Diagnosis present

## 2018-09-28 DIAGNOSIS — E08621 Diabetes mellitus due to underlying condition with foot ulcer: Secondary | ICD-10-CM | POA: Diagnosis not present

## 2018-09-28 DIAGNOSIS — L97509 Non-pressure chronic ulcer of other part of unspecified foot with unspecified severity: Secondary | ICD-10-CM

## 2018-09-28 DIAGNOSIS — E034 Atrophy of thyroid (acquired): Secondary | ICD-10-CM | POA: Diagnosis not present

## 2018-09-28 DIAGNOSIS — G894 Chronic pain syndrome: Secondary | ICD-10-CM | POA: Diagnosis present

## 2018-09-28 DIAGNOSIS — L97529 Non-pressure chronic ulcer of other part of left foot with unspecified severity: Secondary | ICD-10-CM | POA: Diagnosis present

## 2018-09-28 DIAGNOSIS — I129 Hypertensive chronic kidney disease with stage 1 through stage 4 chronic kidney disease, or unspecified chronic kidney disease: Secondary | ICD-10-CM | POA: Diagnosis present

## 2018-09-28 DIAGNOSIS — G253 Myoclonus: Secondary | ICD-10-CM | POA: Diagnosis present

## 2018-09-28 DIAGNOSIS — R509 Fever, unspecified: Secondary | ICD-10-CM | POA: Diagnosis not present

## 2018-09-28 DIAGNOSIS — L97409 Non-pressure chronic ulcer of unspecified heel and midfoot with unspecified severity: Secondary | ICD-10-CM | POA: Diagnosis not present

## 2018-09-28 LAB — CBC WITH DIFFERENTIAL/PLATELET
Abs Immature Granulocytes: 0.07 10*3/uL (ref 0.00–0.07)
Basophils Absolute: 0.1 10*3/uL (ref 0.0–0.1)
Basophils Relative: 0 %
Eosinophils Absolute: 0.1 10*3/uL (ref 0.0–0.5)
Eosinophils Relative: 1 %
HCT: 45.3 % (ref 36.0–46.0)
Hemoglobin: 14.2 g/dL (ref 12.0–15.0)
Immature Granulocytes: 0 %
Lymphocytes Relative: 6 %
Lymphs Abs: 1.2 10*3/uL (ref 0.7–4.0)
MCH: 27.6 pg (ref 26.0–34.0)
MCHC: 31.3 g/dL (ref 30.0–36.0)
MCV: 88 fL (ref 80.0–100.0)
Monocytes Absolute: 0.9 10*3/uL (ref 0.1–1.0)
Monocytes Relative: 5 %
Neutro Abs: 17.1 10*3/uL — ABNORMAL HIGH (ref 1.7–7.7)
Neutrophils Relative %: 88 %
Platelets: 342 10*3/uL (ref 150–400)
RBC: 5.15 MIL/uL — ABNORMAL HIGH (ref 3.87–5.11)
RDW: 12.5 % (ref 11.5–15.5)
WBC: 19.3 10*3/uL — ABNORMAL HIGH (ref 4.0–10.5)
nRBC: 0 % (ref 0.0–0.2)

## 2018-09-28 LAB — COMPREHENSIVE METABOLIC PANEL
ALT: 15 U/L (ref 0–44)
AST: 16 U/L (ref 15–41)
Albumin: 4.3 g/dL (ref 3.5–5.0)
Alkaline Phosphatase: 106 U/L (ref 38–126)
Anion gap: 11 (ref 5–15)
BUN: 12 mg/dL (ref 6–20)
CO2: 27 mmol/L (ref 22–32)
Calcium: 9.1 mg/dL (ref 8.9–10.3)
Chloride: 93 mmol/L — ABNORMAL LOW (ref 98–111)
Creatinine, Ser: 1.1 mg/dL — ABNORMAL HIGH (ref 0.44–1.00)
GFR calc Af Amer: >60 mL/min (ref >60)
GFR calc non Af Amer: 58 mL/min — ABNORMAL LOW (ref >60)
Glucose, Bld: 231 mg/dL — ABNORMAL HIGH (ref 70–99)
Potassium: 4.6 mmol/L (ref 3.5–5.1)
Sodium: 131 mmol/L — ABNORMAL LOW (ref 135–145)
Total Bilirubin: 0.5 mg/dL (ref 0.3–1.2)
Total Protein: 9.2 g/dL — ABNORMAL HIGH (ref 6.5–8.1)

## 2018-09-28 LAB — URINALYSIS, ROUTINE W REFLEX MICROSCOPIC
Bilirubin Urine: NEGATIVE
Glucose, UA: NEGATIVE mg/dL
Hgb urine dipstick: NEGATIVE
Ketones, ur: NEGATIVE mg/dL
Leukocytes, UA: NEGATIVE
Nitrite: NEGATIVE
Protein, ur: NEGATIVE mg/dL
Specific Gravity, Urine: 1.009 (ref 1.005–1.030)
pH: 7 (ref 5.0–8.0)

## 2018-09-28 LAB — LACTIC ACID, PLASMA: Lactic Acid, Venous: 2.2 mmol/L (ref 0.5–1.9)

## 2018-09-28 LAB — BRAIN NATRIURETIC PEPTIDE: B Natriuretic Peptide: 58 pg/mL (ref 0.0–100.0)

## 2018-09-28 LAB — CBG MONITORING, ED: Glucose-Capillary: 219 mg/dL — ABNORMAL HIGH (ref 70–99)

## 2018-09-28 MED ORDER — OXYCODONE-ACETAMINOPHEN 5-325 MG PO TABS
2.0000 | ORAL_TABLET | Freq: Once | ORAL | Status: AC
  Administered 2018-09-28: 2 via ORAL
  Filled 2018-09-28: qty 2

## 2018-09-28 MED ORDER — FENTANYL CITRATE (PF) 100 MCG/2ML IJ SOLN
100.0000 ug | Freq: Once | INTRAMUSCULAR | Status: AC
  Administered 2018-09-28: 100 ug via INTRAVENOUS
  Filled 2018-09-28: qty 2

## 2018-09-28 MED ORDER — HYDROXYZINE HCL 25 MG PO TABS
25.0000 mg | ORAL_TABLET | Freq: Once | ORAL | Status: AC
  Administered 2018-09-28: 25 mg via ORAL
  Filled 2018-09-28: qty 1

## 2018-09-28 MED ORDER — METRONIDAZOLE IN NACL 5-0.79 MG/ML-% IV SOLN
500.0000 mg | Freq: Three times a day (TID) | INTRAVENOUS | Status: DC
  Administered 2018-09-28 – 2018-09-30 (×5): 500 mg via INTRAVENOUS
  Filled 2018-09-28 (×5): qty 100

## 2018-09-28 MED ORDER — SODIUM CHLORIDE 0.9 % IV BOLUS (SEPSIS)
1000.0000 mL | Freq: Once | INTRAVENOUS | Status: AC
  Administered 2018-09-28: 1000 mL via INTRAVENOUS

## 2018-09-28 MED ORDER — SODIUM CHLORIDE 0.9 % IV SOLN
2.0000 g | Freq: Once | INTRAVENOUS | Status: AC
  Administered 2018-09-28: 2 g via INTRAVENOUS
  Filled 2018-09-28: qty 2

## 2018-09-28 MED ORDER — SODIUM CHLORIDE 0.9 % IV SOLN
1000.0000 mL | INTRAVENOUS | Status: DC
  Administered 2018-09-28: 1000 mL via INTRAVENOUS

## 2018-09-28 MED ORDER — ACETAMINOPHEN 500 MG PO TABS
1000.0000 mg | ORAL_TABLET | Freq: Once | ORAL | Status: AC
  Administered 2018-09-28: 1000 mg via ORAL
  Filled 2018-09-28: qty 2

## 2018-09-28 MED ORDER — VANCOMYCIN HCL 10 G IV SOLR
1500.0000 mg | Freq: Once | INTRAVENOUS | Status: AC
  Administered 2018-09-28: 1500 mg via INTRAVENOUS
  Filled 2018-09-28 (×2): qty 1500

## 2018-09-28 MED ORDER — VANCOMYCIN HCL IN DEXTROSE 1-5 GM/200ML-% IV SOLN: 1000.0000 mg | Freq: Once | INTRAVENOUS | Status: DC

## 2018-09-28 NOTE — ED Notes (Signed)
CRITICAL VALUE ALERT  Critical Value:  Lactic Acid 2.2  Date & Time Notied:  09/28/18 & 2215 hrs  Provider Notified: Lily Kocher, PA  Orders Received/Actions taken: N/A

## 2018-09-28 NOTE — ED Provider Notes (Signed)
[facility] EMERGENCY DEPARTMENT Provider Note   CSN: [id number] Arrival date & time: 09/28/18  2023     History   Chief Complaint Chief Complaint  Patient presents with  . Fever    HPI Katherine Herrera is a 52 y.o. female.  Patient is a 52 year old female who presents to the emergency department with a complaint of fever and chills.  The patient has a history of diabetes mellitus, chronic obstructive pulmonary disease, diabetic ulcers of the left foot, diabetic neuropathy, low back pain, and anxiety.  Patient states that she has had some problems with pain of her foot since having had surgery on the left foot.  She has had a diabetic ulcer there and she is also had surgery to help relieve some of the pressure on that portion of her foot by podiatry.  Today she said that she noted chills, she measured a temperature of 100.5 at home.  She states that the wound wherein she had the ulcer had healed, but remains sore and was more sore than usual today.  She failed it and it was warm to touch.  She noticed increased redness of the left lower leg.  And she says that when she takes a step she has pain from the third and fourth toe up to her groin area.  She has not had any vomiting, but has had nausea.  There is been no recent injury, no fall, or trauma to the left lower extremity.  The patient states that she had members of her family to take a look at her leg and they said it looked much worse than usual and she came to the emergency department for additional evaluation and management.  The history is provided by the patient.  Fever   Pertinent negatives include no chest pain and no cough.    Past Medical History:  Diagnosis Date  . Anxiety   . COPD (chronic obstructive pulmonary disease) ([place])   . DM (diabetes mellitus) ([place])   . Gout    possible  . Hyperlipidemia   . Hypothyroidism   . LBP (low back pain)    DR Patrice Paradise  . Migraine   . Peripheral neuropathy   . Syncope    poss  conversion disorder vs psychogenic seizures  . Tachycardia    diet coke overuse?  Marland Kitchen Tenosynovitis of foot and ankle 09/03/2013  . TTS (tarsal tunnel syndrome) 2010   Left Foot [place] in [place]  . Vitamin D deficiency     Patient Active Problem List   Diagnosis Date Noted  . Syncope 07/30/2018  . Earache on right 08/02/2017  . Cerumen impaction 08/02/2017  . Leg swelling 10/28/2016  . Knee contusion 01/13/2016  . Cellulitis and abscess of leg 07/24/2015  . Nausea without vomiting 07/24/2015  . Leg edema, left 06/19/2015  . DOE (dyspnea on exertion) 06/16/2015  . Chest pain, atypical 06/16/2015  . Family history of musculoskeletal disease 03/12/2015  . Non-compliant behavior 12/24/2014  . Ulcer of right foot limited to breakdown of skin ([place]) 09/02/2014  . Ulcer of foot due to diabetes mellitus ([place]) 08/12/2014  . Ulcer of other part of foot 06/20/2014  . Leg pain, left 03/31/2014  . Callus of foot 03/25/2014  . 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 Rash 01/28/2014  . Pain in lower limb 01/24/2014  . Keratosis 01/24/2014  . Leg cramps 10/09/2013  . Vaginitis and vulvovaginitis 10/09/2013  . Ingrown nail 09/30/2013  . Pain in toe of left foot 09/30/2013  .  Tenosynovitis of foot and ankle 09/03/2013  . Deformity of metatarsal bone of right foot 09/03/2013  . Pain in joint, ankle and foot 01/22/2013  . Pes cavus 10/13/2012  . Acquired tight Achilles tendon 10/13/2012  . Paronychia 06/14/2012  . 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 ([place]) 03/19/2012  . Allergic rhinitis 03/19/2012  . URI (upper respiratory infection) 08/30/2011  . Myalgia 04/18/2011  . SHOULDER PAIN 01/17/2011  . TTS (tarsal tunnel syndrome)   . GASTROENTERITIS 10/18/2010  . ARTHRALGIA 01/04/2010  . WEIGHT GAIN, ABNORMAL 01/04/2010  . TACHYCARDIA 10/02/2009  . CBC, ABNORMAL 05/01/2009  . B12 deficiency 01/05/2009  . GOUT 01/05/2009  . Edema 01/05/2009  . Anxiety state 07/28/2008  . ELBOW PAIN  07/28/2008  . TOBACCO USE DISORDER/SMOKER-SMOKING CESSATION DISCUSSED 04/21/2008  . FOOT PAIN 04/21/2008  . PARESTHESIA 04/21/2008  . ABDOMINAL PAIN 04/21/2008  . SINUSITIS- ACUTE-NOS 11/13/2007  . COPD exacerbation ([place]) 11/13/2007  . Diabetic neuropathy ([place]) 08/27/2007  . LOW BACK PAIN 08/27/2007  . Dyslipidemia 08/20/2007  . Hypothyroidism 05/22/2007    Past Surgical History:  Procedure Laterality Date  . ABDOMINAL HYSTERECTOMY    . CARPAL TUNNEL RELEASE  2010   LEFT  . CERVICAL FUSION     C4-5     OB History   None      Home Medications    Prior to Admission medications   Medication Sig Start Date End Date Taking? Authorizing Provider  Albuterol Sulfate (PROAIR RESPICLICK) 528 (90 BASE) MCG/ACT AEPB Inhale 1-2 puffs into the lungs 4 (four) times daily as needed. 06/16/15   Plotnikov, [name], MD  aspirin (BAYER ASPIRIN) 325 MG tablet Take 1 tablet (325 mg total) by mouth daily. 06/16/15   Plotnikov, [name], MD  B-D INS SYR ULTRAFINE 1CC/31G 31G X 5/16" 1 ML MISC USE AS DIRECTED 09/08/16   Renato Shin, MD  baclofen (LIORESAL) 10 MG tablet TAKE 2 TABLETS BY MOUTH TWO TIMES DAILY 10/06/17   Plotnikov, [name], MD  BISACODYL 5 MG EC tablet TAKE TWO TABLETS BY MOUTH EVERY DAY 06/06/17   Plotnikov, [name], MD  Cholecalciferol (VITAMIN D3) 1000 UNIT tablet Take 2,000 Units by mouth daily.      [provider]  clonazePAM (KLONOPIN) 0.5 MG tablet TAKE ONE TABLET BY MOUTH 2 TIMES A DAY AS NEEDED FOR ANXIETY 12/28/17   Plotnikov, [name], MD  clotrimazole-betamethasone (LOTRISONE) cream Apply 1 application topically 2 (two) times daily. 02/17/17   Plotnikov, [name], MD  diclofenac sodium (VOLTAREN) 1 % GEL Apply 2 g topically 4 (four) times daily. 10/28/16   Hoyt Koch, MD  ezetimibe (ZETIA) 10 MG tablet TAKE ONE TABLET BY MOUTH EVERY DAY 06/13/18   Plotnikov, [name], MD  gabapentin (NEURONTIN) 800 MG tablet TAKE 1 TABLET BY MOUTH FOUR TIMES DAILY  04/18/18   Renato Shin, MD  glucose blood (ONETOUCH VERIO) test strip Use to check blood sugar 2 times per day. 06/24/16   Renato Shin, MD  insulin NPH-regular Human (NOVOLIN 70/30) (70-30) 100 UNIT/ML injection 60 units with breakfast, and 35 units with supper, and syringes 11/13/17   Renato Shin, MD  Insulin Syringe-Needle U-100 (SURE COMFORT INSULIN SYRINGE) 31G X 5/16" 0.5 ML MISC USE AS DIRECTED WITH INSULIN 4 TIMES A DAY 09/08/17   Renato Shin, MD  lamoTRIgine (LAMICTAL) 25 MG tablet TAKE 1 TABLET BY MOUTH TWO  TIMES DAILY 04/18/18   Plotnikov, [name], MD  Lancet Devices (ACCU-CHEK [facility]) lancets Test two  times daily 07/01/16   Renato Shin, MD  levothyroxine (SYNTHROID, LEVOTHROID) 75 MCG tablet Take 1 tablet (75 mcg total) by mouth daily. 04/27/18   Plotnikov, [name], MD  lidocaine (XYLOCAINE) 5 % ointment Apply 1 application topically as needed. 11/03/14   Sheard, Myeong O, DPM  lisinopril (PRINIVIL,ZESTRIL) 10 MG tablet TAKE ONE TABLET BY MOUTH EVERY DAY 09/17/18   Plotnikov, [name], MD  ondansetron (ZOFRAN ODT) 4 MG disintegrating tablet Take 1 tablet (4 mg total) by mouth every 8 (eight) hours as needed for nausea or vomiting. 05/19/17   Plotnikov, [name], MD  [facility] DELICA LANCETS 75F MISC TEST TWO TIMES DAILY 04/18/18   Renato Shin, MD  oxyCODONE (ROXICODONE) 15 MG immediate release tablet Take 1 tablet (15 mg total) by mouth every 6 (six) hours as needed for pain. 08/29/18   Plotnikov, [name], MD  polyethylene glycol powder (GLYCOLAX/MIRALAX) powder TAKE 17 GRAMS BY MOUTH EVERY 12 HOURS AS NEEDED 06/06/17   Plotnikov, [name], MD  promethazine (PHENERGAN) 25 MG tablet Take 1 tablet (25 mg total) by mouth every 8 (eight) hours as needed for nausea or vomiting. 03/08/18   Evelina Bucy, DPM  rOPINIRole (REQUIP) 0.5 MG tablet TAKE ONE TABLET BY MOUTH AT BEDTIME 02/21/18   Plotnikov, [name], MD  vitamin B-12 (CYANOCOBALAMIN) 1000 MCG tablet Take 1,000 mcg by mouth  daily.      [provider]    Family History Family History  Problem Relation Age of Onset  . Diabetes Mother   . Hypertension Mother   . Cancer Father 4       ? bone cancer  . [name] Brother     Social History Social History   Tobacco Use  . Smoking status: Current Every Day Smoker    Packs/day: 1.00    Last attempt to quit: 12/01/2009    Years since quitting: 8.8  . Smokeless tobacco: Never Used  Substance Use Topics  . Alcohol use: No    Alcohol/week: 0.0 standard drinks  . Drug use: No     Allergies   Hydromorphone hcl; Cephalexin; Demerol  [meperidine hcl]; Lovastatin; Meperidine; Metformin and related; Rosuvastatin calcium; Sulfamethoxazole-trimethoprim; Crestor [rosuvastatin calcium]; Metformin; Niacin; Meperidine hcl; and Paroxetine   Review of Systems Review of Systems  Constitutional: Positive for chills and fever. Negative for activity change.       All ROS Neg except as noted in HPI  HENT: Negative for nosebleeds.   Eyes: Negative for photophobia and discharge.  Respiratory: Negative for cough, shortness of breath and wheezing.   Cardiovascular: Positive for leg swelling. Negative for chest pain and palpitations.  Gastrointestinal: Negative for abdominal pain and blood in stool.  Genitourinary: Negative for dysuria, frequency and hematuria.  Musculoskeletal: Positive for arthralgias and back pain. Negative for neck pain.  Skin: Negative.        Ulcer of the left foot  Neurological: Negative for dizziness, seizures and speech difficulty.  Psychiatric/Behavioral: Negative for confusion and hallucinations.     Physical Exam Updated Vital Signs BP (!) 168/80   Pulse (!) 136   Temp (!) 102.3 F (39.1 C) (Tympanic)   Resp (!) 22   Ht 5\' 9"  (1.753 m)   Wt 117.9 kg   SpO2 100%   BMI 38.40 kg/m   Physical Exam  Constitutional: She is oriented to person, place, and time. She appears well-developed and well-nourished.  Non-toxic  appearance. She appears ill.  HENT:  Head: Normocephalic.  Right Ear: Tympanic membrane and external ear normal.  Left Ear: Tympanic membrane and external ear normal.  Eyes: Pupils are equal, round, and reactive to light. EOM and lids are normal.  Neck: Normal range of motion. Neck supple. Carotid bruit is not present.  Cardiovascular: Regular rhythm, normal heart sounds, intact distal pulses and normal pulses. Tachycardia present.  Patient tachycardic at 136 bpm.  Pulmonary/Chest: Breath sounds normal. No respiratory distress.  Few scattered rhonchi.  There is symmetrical rise and fall of the chest.  The patient speaks in complete sentences without problem.  Abdominal: Soft. Bowel sounds are normal. There is no tenderness. There is no guarding.  Musculoskeletal: Normal range of motion.       Left lower leg: She exhibits tenderness and swelling.  There is an old bruise area at the base of the third and fourth metatarsal.  There are no lesions noted between the toes.  There is no evidence of a puncture wound to the plantar surface of the left foot.  There is tenderness in this area.  There is a scabbed ulcer of the lateral portion of the left foot near the fifth metatarsal head.  The dorsum of the foot is warm to touch.  There is a large area of increase redness and tenderness of the lower portion of the left lower extremity.  It is hot to touch and tender to palpation.  It is circumferential.  There is some increased warmth to the thigh area.  There is no effusion noted of the knee.  The knee is not hot.  There are palpable nodes of the left inguinal area.  Lymphadenopathy:       Head (right side): No submandibular adenopathy present.       Head (left side): No submandibular adenopathy present.    She has no cervical adenopathy.  Neurological: She is alert and oriented to person, place, and time. She has normal strength. No cranial nerve deficit or sensory deficit.  Skin: Skin is warm and dry.    Psychiatric: Her speech is normal. Her mood appears anxious.  Nursing note and vitals reviewed.    ED Treatments / Results  Labs (all labs ordered are listed, but only abnormal results are displayed) Labs Reviewed  CULTURE, BLOOD (ROUTINE X 2)  CULTURE, BLOOD (ROUTINE X 2)  URINE CULTURE  COMPREHENSIVE METABOLIC PANEL  CBC WITH DIFFERENTIAL/PLATELET  URINALYSIS, ROUTINE W REFLEX MICROSCOPIC  LACTIC ACID, PLASMA  LACTIC ACID, PLASMA  BRAIN NATRIURETIC PEPTIDE  CBG MONITORING, ED    EKG None  Radiology No results found.  Procedures Procedures (including critical care time) CRITICAL CARE Performed by: Lily Kocher Total critical care time: **35* minutes Critical care time was exclusive of separately billable procedures and treating other patients. Critical care was necessary to treat or prevent imminent or life-threatening deterioration. Critical care was time spent personally by me on the following activities: development of treatment plan with patient and/or surrogate as well as nursing, discussions with consultants, evaluation of patient's response to treatment, examination of patient, obtaining history from patient or surrogate, ordering and performing treatments and interventions, ordering and review of laboratory studies, ordering and review of radiographic studies, pulse oximetry and re-evaluation of patient's condition.  Medications Ordered in ED Medications  ceFEPIme (MAXIPIME) 2 g in sodium chloride 0.9 % 100 mL IVPB (has no administration in time range)  metroNIDAZOLE (FLAGYL) IVPB 500 mg (has no administration in time range)  vancomycin (VANCOCIN) IVPB 1000 mg/200 mL premix (has no administration in  time range)  fentaNYL (SUBLIMAZE) injection 100 mcg (has no administration in time range)  hydrOXYzine (ATARAX/VISTARIL) tablet 25 mg (has no administration in time range)     Initial Impression / Assessment and Plan / ED Course  I have reviewed the triage vital  signs and the nursing notes.  Pertinent labs & imaging results that were available during my care of the patient were reviewed by me and considered in my medical decision making (see chart for details).       Final Clinical Impressions(s) / ED Diagnoses MDM  Temperature is elevated at 102.3, heart rate is elevated at 136, respiratory rate is elevated at 22, and the blood pressure is elevated at 168/80.  The pulse oximetry is 100% on room air.  Within normal limits by my interpretation.   Code sepsis called.  Source of infection is most likely cellulitis of the left lower extremity. Pt seen with me by Dr Sedonia Small.  CBG 219.  IV fluid bolus started.  IV vancomycin, Maxipime, and Flagyl. White blood cell count returns on the CBC at 19,300.  No noted shift to the left.  Recheck.  Patient states she can feel the medication for pain, but it has not stopped her pain yet.  Heart rate remains elevated.  Blood pressure stable.  The B natruretic peptide is normal at 58, the lactic acid is elevated at 2.2. Complaints of metabolic panel showed glucose elevated at 231, creatinine elevated at 1.10, otherwise essentially within normal limits.  The anion gap is normal at 11.  Recheck.  Heart rate down to 115.  Respiratory rate down to 16, blood pressure improved over 120/72.  Pulse oximetry is 95% on room air.  Patient remains awake and alert.  Pain only minimally improving after IV fentanyl.  Chest x-ray shows no active disease.  Left tibia/fibula shows diffuse soft tissue swelling without soft tissue emphysema.  No occult fracture appreciated.  X-ray of the left foot shows status post amputation of the distal fifth metatarsal.  There is some osteopenia present with possible subtle cortical loss consistent with possible early changes of osteomyelitis.  There is also noted a soft tissue ulcer at the plantar/lateral aspect of the foot at the level of the amputation.Case discussed with pt and family. Results  given. Recheck.  Patient states she is just not getting any relief from the IV fentanyl.  Patient will receive oral Percocet.  Heart rate improving.  Temperature down to 100.1 after oral Tylenol.  Discussed admission with patient. Questions from patient and family discussed. Case discussed with [facility] hospitalist.  Patient will be admitted to the hospital.   Final diagnoses:  None    ED Discharge Orders    None       Lily Kocher, PA-C 09/28/18 [id number]    Maudie Flakes, MD 09/30/18 [phone]

## 2018-09-28 NOTE — ED Triage Notes (Signed)
Pt c/o fever, chills and left lower leg pain and redness.

## 2018-09-29 ENCOUNTER — Inpatient Hospital Stay (HOSPITAL_COMMUNITY): Payer: Medicare Other

## 2018-09-29 ENCOUNTER — Other Ambulatory Visit: Payer: Self-pay

## 2018-09-29 ENCOUNTER — Encounter (HOSPITAL_COMMUNITY): Payer: Self-pay | Admitting: *Deleted

## 2018-09-29 DIAGNOSIS — L0390 Cellulitis, unspecified: Secondary | ICD-10-CM

## 2018-09-29 DIAGNOSIS — E11621 Type 2 diabetes mellitus with foot ulcer: Secondary | ICD-10-CM

## 2018-09-29 DIAGNOSIS — G894 Chronic pain syndrome: Secondary | ICD-10-CM

## 2018-09-29 DIAGNOSIS — A419 Sepsis, unspecified organism: Secondary | ICD-10-CM

## 2018-09-29 DIAGNOSIS — L03116 Cellulitis of left lower limb: Secondary | ICD-10-CM

## 2018-09-29 DIAGNOSIS — N289 Disorder of kidney and ureter, unspecified: Secondary | ICD-10-CM

## 2018-09-29 DIAGNOSIS — E871 Hypo-osmolality and hyponatremia: Secondary | ICD-10-CM | POA: Diagnosis present

## 2018-09-29 DIAGNOSIS — L97509 Non-pressure chronic ulcer of other part of unspecified foot with unspecified severity: Secondary | ICD-10-CM

## 2018-09-29 DIAGNOSIS — E034 Atrophy of thyroid (acquired): Secondary | ICD-10-CM

## 2018-09-29 DIAGNOSIS — G8929 Other chronic pain: Secondary | ICD-10-CM | POA: Diagnosis present

## 2018-09-29 DIAGNOSIS — L97409 Non-pressure chronic ulcer of unspecified heel and midfoot with unspecified severity: Secondary | ICD-10-CM

## 2018-09-29 DIAGNOSIS — E1140 Type 2 diabetes mellitus with diabetic neuropathy, unspecified: Secondary | ICD-10-CM

## 2018-09-29 LAB — BLOOD CULTURE ID PANEL (REFLEXED)

## 2018-09-29 LAB — GLUCOSE, CAPILLARY
Glucose-Capillary: 125 mg/dL — ABNORMAL HIGH (ref 70–99)
Glucose-Capillary: 173 mg/dL — ABNORMAL HIGH (ref 70–99)
Glucose-Capillary: 180 mg/dL — ABNORMAL HIGH (ref 70–99)
Glucose-Capillary: 208 mg/dL — ABNORMAL HIGH (ref 70–99)
Glucose-Capillary: 209 mg/dL — ABNORMAL HIGH (ref 70–99)

## 2018-09-29 LAB — CBC WITH DIFFERENTIAL/PLATELET
Abs Immature Granulocytes: 0.05 10*3/uL (ref 0.00–0.07)
Basophils Absolute: 0 10*3/uL (ref 0.0–0.1)
Basophils Relative: 0 %
Eosinophils Absolute: 0 10*3/uL (ref 0.0–0.5)
Eosinophils Relative: 0 %
HCT: 39.4 % (ref 36.0–46.0)
Hemoglobin: 12.2 g/dL (ref 12.0–15.0)
Immature Granulocytes: 0 %
Lymphocytes Relative: 8 %
Lymphs Abs: 1.1 10*3/uL (ref 0.7–4.0)
MCH: 28 pg (ref 26.0–34.0)
MCHC: 31 g/dL (ref 30.0–36.0)
MCV: 90.6 fL (ref 80.0–100.0)
Monocytes Absolute: 0.9 10*3/uL (ref 0.1–1.0)
Monocytes Relative: 6 %
Neutro Abs: 12.3 10*3/uL — ABNORMAL HIGH (ref 1.7–7.7)
Neutrophils Relative %: 86 %
Platelets: 240 10*3/uL (ref 150–400)
RBC: 4.35 MIL/uL (ref 3.87–5.11)
RDW: 12.6 % (ref 11.5–15.5)
WBC: 14.4 10*3/uL — ABNORMAL HIGH (ref 4.0–10.5)
nRBC: 0 % (ref 0.0–0.2)

## 2018-09-29 LAB — PREALBUMIN: Prealbumin: 14.6 mg/dL — ABNORMAL LOW (ref 18–38)

## 2018-09-29 LAB — BASIC METABOLIC PANEL
Anion gap: 8 (ref 5–15)
BUN: 13 mg/dL (ref 6–20)
CO2: 25 mmol/L (ref 22–32)
Calcium: 7.9 mg/dL — ABNORMAL LOW (ref 8.9–10.3)
Chloride: 102 mmol/L (ref 98–111)
Creatinine, Ser: 0.94 mg/dL (ref 0.44–1.00)
GFR calc Af Amer: >60 mL/min (ref >60)
GFR calc non Af Amer: >60 mL/min (ref >60)
Glucose, Bld: 211 mg/dL — ABNORMAL HIGH (ref 70–99)
Potassium: 4.1 mmol/L (ref 3.5–5.1)
Sodium: 135 mmol/L (ref 135–145)

## 2018-09-29 LAB — HEMOGLOBIN A1C
Hgb A1c MFr Bld: 8.5 % — ABNORMAL HIGH (ref 4.8–5.6)
Mean Plasma Glucose: 197.25 mg/dL

## 2018-09-29 LAB — SEDIMENTATION RATE: Sed Rate: 45 mm/hr — ABNORMAL HIGH (ref 0–22)

## 2018-09-29 LAB — C-REACTIVE PROTEIN: CRP: 9.8 mg/dL — ABNORMAL HIGH (ref <1.0)

## 2018-09-29 LAB — LACTIC ACID, PLASMA: Lactic Acid, Venous: 2.5 mmol/L (ref 0.5–1.9)

## 2018-09-29 MED ORDER — INSULIN DETEMIR 100 UNIT/ML ~~LOC~~ SOLN
35.0000 [IU] | Freq: Two times a day (BID) | SUBCUTANEOUS | Status: DC
  Administered 2018-09-29 (×2): 35 [IU] via SUBCUTANEOUS
  Filled 2018-09-29 (×6): qty 0.35

## 2018-09-29 MED ORDER — CLONAZEPAM 0.5 MG PO TABS
0.5000 mg | ORAL_TABLET | Freq: Two times a day (BID) | ORAL | Status: DC | PRN
  Administered 2018-09-29 – 2018-10-03 (×4): 0.5 mg via ORAL
  Filled 2018-09-29 (×4): qty 1

## 2018-09-29 MED ORDER — SODIUM CHLORIDE 0.9 % IV BOLUS
500.0000 mL | Freq: Once | INTRAVENOUS | Status: AC
  Administered 2018-09-29: 500 mL via INTRAVENOUS

## 2018-09-29 MED ORDER — ONDANSETRON HCL 4 MG PO TABS: 4.0000 mg | ORAL_TABLET | Freq: Four times a day (QID) | ORAL | Status: DC | PRN

## 2018-09-29 MED ORDER — VANCOMYCIN HCL IN DEXTROSE 1-5 GM/200ML-% IV SOLN: 1000.0000 mg | Freq: Two times a day (BID) | INTRAVENOUS | Status: DC

## 2018-09-29 MED ORDER — VANCOMYCIN HCL IN DEXTROSE 1-5 GM/200ML-% IV SOLN
1000.0000 mg | Freq: Two times a day (BID) | INTRAVENOUS | Status: DC
  Administered 2018-09-29: 1000 mg via INTRAVENOUS
  Filled 2018-09-29: qty 200

## 2018-09-29 MED ORDER — DOCUSATE SODIUM 100 MG PO CAPS
200.0000 mg | ORAL_CAPSULE | Freq: Every evening | ORAL | Status: DC
  Administered 2018-09-29 – 2018-10-02 (×4): 200 mg via ORAL
  Filled 2018-09-29 (×4): qty 2

## 2018-09-29 MED ORDER — HEPARIN SODIUM (PORCINE) 5000 UNIT/ML IJ SOLN
5000.0000 [IU] | Freq: Three times a day (TID) | INTRAMUSCULAR | Status: DC
  Administered 2018-09-29 – 2018-10-05 (×19): 5000 [IU] via SUBCUTANEOUS
  Filled 2018-09-29 (×19): qty 1

## 2018-09-29 MED ORDER — INSULIN ASPART 100 UNIT/ML ~~LOC~~ SOLN
0.0000 [IU] | Freq: Three times a day (TID) | SUBCUTANEOUS | Status: DC
  Administered 2018-09-29 (×2): 3 [IU] via SUBCUTANEOUS

## 2018-09-29 MED ORDER — INSULIN ASPART 100 UNIT/ML ~~LOC~~ SOLN: 0.0000 [IU] | Freq: Every day | SUBCUTANEOUS | Status: DC

## 2018-09-29 MED ORDER — OXYCODONE HCL 5 MG PO TABS
15.0000 mg | ORAL_TABLET | Freq: Four times a day (QID) | ORAL | Status: DC | PRN
  Administered 2018-09-29 (×2): 15 mg via ORAL
  Filled 2018-09-29 (×2): qty 3

## 2018-09-29 MED ORDER — LEVOTHYROXINE SODIUM 75 MCG PO TABS
75.0000 ug | ORAL_TABLET | Freq: Every day | ORAL | Status: DC
  Administered 2018-09-29 – 2018-10-05 (×7): 75 ug via ORAL
  Filled 2018-09-29 (×7): qty 1

## 2018-09-29 MED ORDER — SODIUM CHLORIDE 0.9% FLUSH
3.0000 mL | Freq: Two times a day (BID) | INTRAVENOUS | Status: DC
  Administered 2018-09-29 – 2018-10-05 (×9): 3 mL via INTRAVENOUS

## 2018-09-29 MED ORDER — SODIUM CHLORIDE 0.9 % IV SOLN
2.0000 g | Freq: Three times a day (TID) | INTRAVENOUS | Status: DC
  Administered 2018-09-29 – 2018-09-30 (×4): 2 g via INTRAVENOUS
  Filled 2018-09-29 (×8): qty 2

## 2018-09-29 MED ORDER — ONDANSETRON HCL 4 MG/2ML IJ SOLN: 4.0000 mg | Freq: Four times a day (QID) | INTRAMUSCULAR | Status: DC | PRN

## 2018-09-29 MED ORDER — BACLOFEN 10 MG PO TABS
20.0000 mg | ORAL_TABLET | Freq: Two times a day (BID) | ORAL | Status: DC
  Administered 2018-09-29 – 2018-10-05 (×14): 20 mg via ORAL
  Filled 2018-09-29 (×14): qty 2

## 2018-09-29 MED ORDER — LAMOTRIGINE 25 MG PO TABS
25.0000 mg | ORAL_TABLET | Freq: Two times a day (BID) | ORAL | Status: DC
  Administered 2018-09-29 – 2018-10-05 (×14): 25 mg via ORAL
  Filled 2018-09-29 (×21): qty 1

## 2018-09-29 MED ORDER — ACETAMINOPHEN 650 MG RE SUPP: 650.0000 mg | Freq: Four times a day (QID) | RECTAL | Status: DC | PRN

## 2018-09-29 MED ORDER — INSULIN ASPART PROT & ASPART (70-30 MIX) 100 UNIT/ML ~~LOC~~ SUSP
60.0000 [IU] | Freq: Every day | SUBCUTANEOUS | Status: DC
  Administered 2018-09-30 – 2018-10-01 (×2): 60 [IU] via SUBCUTANEOUS
  Filled 2018-09-29: qty 10

## 2018-09-29 MED ORDER — INSULIN ASPART 100 UNIT/ML ~~LOC~~ SOLN
0.0000 [IU] | Freq: Every day | SUBCUTANEOUS | Status: DC
  Administered 2018-09-30: 5 [IU] via SUBCUTANEOUS
  Administered 2018-10-02: 3 [IU] via SUBCUTANEOUS

## 2018-09-29 MED ORDER — VITAMIN B-12 1000 MCG PO TABS
1000.0000 ug | ORAL_TABLET | Freq: Every day | ORAL | Status: DC
  Administered 2018-09-29 – 2018-10-05 (×7): 1000 ug via ORAL
  Filled 2018-09-29 (×7): qty 1

## 2018-09-29 MED ORDER — GABAPENTIN 400 MG PO CAPS
800.0000 mg | ORAL_CAPSULE | Freq: Four times a day (QID) | ORAL | Status: DC
  Administered 2018-09-29 – 2018-10-05 (×25): 800 mg via ORAL
  Filled 2018-09-29 (×25): qty 2

## 2018-09-29 MED ORDER — ACETAMINOPHEN 325 MG PO TABS
650.0000 mg | ORAL_TABLET | Freq: Four times a day (QID) | ORAL | Status: DC | PRN
  Administered 2018-10-03: 650 mg via ORAL
  Filled 2018-09-29: qty 2

## 2018-09-29 MED ORDER — PRO-STAT SUGAR FREE PO LIQD
30.0000 mL | Freq: Three times a day (TID) | ORAL | Status: DC
  Administered 2018-09-29 – 2018-10-05 (×19): 30 mL via ORAL
  Filled 2018-09-29 (×18): qty 30

## 2018-09-29 MED ORDER — INSULIN DETEMIR 100 UNIT/ML ~~LOC~~ SOLN
35.0000 [IU] | Freq: Every day | SUBCUTANEOUS | Status: DC
  Administered 2018-09-30: 35 [IU] via SUBCUTANEOUS
  Filled 2018-09-29 (×2): qty 0.35

## 2018-09-29 MED ORDER — JUVEN PO PACK
1.0000 | PACK | Freq: Two times a day (BID) | ORAL | Status: DC
  Administered 2018-09-29 – 2018-10-05 (×13): 1 via ORAL
  Filled 2018-09-29 (×12): qty 1

## 2018-09-29 MED ORDER — ROPINIROLE HCL 0.25 MG PO TABS
0.5000 mg | ORAL_TABLET | Freq: Every day | ORAL | Status: DC
  Administered 2018-09-29 – 2018-10-04 (×7): 0.5 mg via ORAL
  Filled 2018-09-29 (×7): qty 2

## 2018-09-29 MED ORDER — SODIUM CHLORIDE 0.9 % IV SOLN
INTRAVENOUS | Status: AC
  Filled 2018-09-29: qty 2

## 2018-09-29 MED ORDER — EZETIMIBE 10 MG PO TABS
10.0000 mg | ORAL_TABLET | Freq: Every day | ORAL | Status: DC
  Administered 2018-09-29 – 2018-10-05 (×7): 10 mg via ORAL
  Filled 2018-09-29 (×7): qty 1

## 2018-09-29 MED ORDER — OXYCODONE HCL 5 MG PO TABS
30.0000 mg | ORAL_TABLET | Freq: Four times a day (QID) | ORAL | Status: DC | PRN
  Administered 2018-09-29 – 2018-09-30 (×4): 30 mg via ORAL
  Filled 2018-09-29 (×4): qty 6

## 2018-09-29 MED ORDER — SODIUM CHLORIDE 0.9 % IV SOLN
INTRAVENOUS | Status: AC
  Administered 2018-09-29 (×2): via INTRAVENOUS

## 2018-09-29 MED ORDER — OXYCODONE HCL 5 MG PO TABS
15.0000 mg | ORAL_TABLET | Freq: Four times a day (QID) | ORAL | Status: DC | PRN
  Administered 2018-09-29 – 2018-09-30 (×4): 15 mg via ORAL
  Filled 2018-09-29 (×4): qty 3

## 2018-09-29 MED ORDER — ADULT MULTIVITAMIN W/MINERALS CH
1.0000 | ORAL_TABLET | Freq: Every day | ORAL | Status: DC
  Administered 2018-09-29 – 2018-10-05 (×7): 1 via ORAL
  Filled 2018-09-29 (×7): qty 1

## 2018-09-29 NOTE — Progress Notes (Addendum)
PROGRESS NOTE    Katherine Herrera   PYK:[id number]  DOB: 05-23-1966  DOA: 09/28/2018 PCP: Cassandria Anger, MD   Brief Narrative:  Katherine Herrera is a 52 y.o. female with medical history significant for insulin-dependent diabetes mellitus, hypertension, hypothyroidism, chronic pain, and diabetic ulcers involving bilateral feet status post resection of the distal fifth metatarsal on the left, now presenting to the emergency department for evaluation of fevers, chills, and left ankle pain, swelling, and redness.  In ED > temp 102.3, HR 130s, WBC 19   Subjective: Continues to have pain in left leg     Assessment & Plan:   Principal Problem:   Severe Sepsis due to cellulitis   Lactic acidosis - Vanc, Flagyl Cefepime - blood cultures > gram + Cocci on gram stain - left leg erythema begins at the ankle and extends 3/4 way up the leg- see picture below- need to elevate foot, cont IV antibiotics with polymicrobial coverage- pain medication increased -the callous on the left foot does not appear to be infected to me  xray of the foot > Osteopenia with possible subtle cortical loss at the base of the fifth middle phalanx, early changes of osteomyelitis cannot be excluded-  - ABI found to be normal   Active Problems:   Acute on Chronic pain due to above infection  - on Oxycodone 15 mg every 6 hrs- increase to 30 mg QID  - cont Neurontin, Baclofen    Hypothyroidism - synthroid    Anxiety state - Clonzepam, Lamictal    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    - a1c on 11/30- 8.5 - on  70/30 at home - increase 70/30 to 60 in AM as sugars are high - change sliding scale to lunchtime only    Mild renal insufficiency/   Hyponatremia - improved- likely dehydration  DVT prophylaxis: Heparin Code Status: Full code Family Communication:   Disposition Plan: home when infection improves Consultants:   none Procedures:   none Antimicrobials:  Anti-infectives  (From admission, onward)   Start     Dose/Rate Route Frequency Ordered Stop   09/30/18 1000  vancomycin (VANCOCIN) IVPB 1000 mg/200 mL premix  Status:  Discontinued     1,000 mg 200 mL/hr over 60 Minutes Intravenous Every 12 hours 09/29/18 0003 09/29/18 0012   09/29/18 1000  vancomycin (VANCOCIN) IVPB 1000 mg/200 mL premix     1,000 mg 200 mL/hr over 60 Minutes Intravenous Every 12 hours 09/29/18 0012     09/29/18 0600  ceFEPIme (MAXIPIME) 2 g in sodium chloride 0.9 % 100 mL IVPB     2 g 200 mL/hr over 30 Minutes Intravenous Every 8 hours 09/29/18 0001     09/28/18 2115  vancomycin (VANCOCIN) 1,500 mg in sodium chloride 0.9 % 500 mL IVPB     1,500 mg 250 mL/hr over 120 Minutes Intravenous  Once 09/28/18 2106 09/29/18 0106   09/28/18 2100  ceFEPIme (MAXIPIME) 2 g in sodium chloride 0.9 % 100 mL IVPB     2 g 200 mL/hr over 30 Minutes Intravenous  Once 09/28/18 2054 09/28/18 2232   09/28/18 2100  metroNIDAZOLE (FLAGYL) IVPB 500 mg     500 mg 100 mL/hr over 60 Minutes Intravenous Every 8 hours 09/28/18 2054     09/28/18 2100  vancomycin (VANCOCIN) IVPB 1000 mg/200 mL premix  Status:  Discontinued     1,000 mg 200 mL/hr over 60 Minutes Intravenous  Once 09/28/18 2054 09/28/18 2105  Objective: Vitals:   09/28/18 2313 09/28/18 2330 09/29/18 0005 09/29/18 0542  BP:  116/67 (!) 102/57 113/61  Pulse:  (!) 114 (!) 109 (!) 101  Resp:  20 16 18   Temp: 100.1 F (37.8 C)  100.1 F (37.8 C) 98.7 F (37.1 C)  TempSrc: Oral  Oral Oral  SpO2:  97% 98% 97%  Weight:      Height:        Intake/Output Summary (Last 24 hours) at 09/29/2018 1305 Last data filed at 09/29/2018 0310 Gross per 24 hour  Intake 3292.41 ml  Output -  Net 3292.41 ml   Filed Weights   09/28/18 2028  Weight: 117.9 kg    Examination: General exam: Appears comfortable  HEENT: PERRLA, oral mucosa moist, no sclera icterus or thrush Respiratory system: Clear to auscultation. Respiratory effort  normal. Cardiovascular system: S1 & S2 heard, RRR.   Gastrointestinal system: Abdomen soft, non-tender, nondistended. Normal bowel sounds. Central nervous system: Alert and oriented. No focal neurological deficits. Extremities: No cyanosis, clubbing or edema Skin: No rashes or ulcers Psychiatry:  Mood & affect appropriate.         Data Reviewed: I have personally reviewed following labs and imaging studies  CBC: Recent Labs  Lab 09/28/18 2112 09/29/18 0656  WBC 19.3* 14.4*  NEUTROABS 17.1* 12.3*  HGB 14.2 12.2  HCT 45.3 39.4  MCV 88.0 90.6  PLT 342 017   Basic Metabolic Panel: Recent Labs  Lab 09/28/18 2112 09/29/18 0656  NA 131* 135  K 4.6 4.1  CL 93* 102  CO2 27 25  GLUCOSE 231* 211*  BUN 12 13  CREATININE 1.10* 0.94  CALCIUM 9.1 7.9*   GFR: Estimated Creatinine Clearance: 96 mL/min (by C-G formula based on SCr of 0.94 mg/dL). Liver Function Tests: Recent Labs  Lab 09/28/18 2112  AST 16  ALT 15  ALKPHOS 106  BILITOT 0.5  PROT 9.2*  ALBUMIN 4.3   No results for input(s): LIPASE, AMYLASE in the last 168 hours. No results for input(s): AMMONIA in the last 168 hours. Coagulation Profile: No results for input(s): INR, PROTIME in the last 168 hours. Cardiac Enzymes: No results for input(s): CKTOTAL, CKMB, CKMBINDEX, TROPONINI in the last 168 hours. BNP (last 3 results) No results for input(s): PROBNP in the last 8760 hours. HbA1C: Recent Labs    09/29/18 0656  HGBA1C 8.5*   CBG: Recent Labs  Lab 09/28/18 2131 09/29/18 0016 09/29/18 0738 09/29/18 1149  GLUCAP 219* 173* 208* 209*   Lipid Profile: No results for input(s): CHOL, HDL, LDLCALC, TRIG, CHOLHDL, LDLDIRECT in the last 72 hours. Thyroid Function Tests: No results for input(s): TSH, T4TOTAL, FREET4, T3FREE, THYROIDAB in the last 72 hours. Anemia Panel: No results for input(s): VITAMINB12, FOLATE, FERRITIN, TIBC, IRON, RETICCTPCT in the last 72 hours. Urine analysis:    Component  Value Date/Time   COLORURINE YELLOW 09/28/2018 2318   APPEARANCEUR CLEAR 09/28/2018 2318   LABSPEC 1.009 09/28/2018 2318   PHURINE 7.0 09/28/2018 2318   GLUCOSEU NEGATIVE 09/28/2018 2318   GLUCOSEU 250 (A) 01/21/2014 1159   HGBUR NEGATIVE 09/28/2018 2318   BILIRUBINUR NEGATIVE 09/28/2018 2318   KETONESUR NEGATIVE 09/28/2018 2318   PROTEINUR NEGATIVE 09/28/2018 2318   UROBILINOGEN 1.0 01/21/2014 1159   NITRITE NEGATIVE 09/28/2018 2318   LEUKOCYTESUR NEGATIVE 09/28/2018 2318   Sepsis Labs: @LABRCNTIP (procalcitonin:4,lacticidven:4) ) Recent Results (from the past 240 hour(s))  Blood Culture (routine x 2)     Status: None (Preliminary result)   Collection  Time: 09/28/18  9:12 PM  Result Value Ref Range Status   Specimen Description RIGHT ANTECUBITAL  Final   Special Requests   Final    BOTTLES DRAWN AEROBIC AND ANAEROBIC Blood Culture adequate volume   Culture  Setup Time   Final    GRAM POSITIVE COCCI Gram Stain Report Called to,Read Back By and Verified With: FOLEY B. @ 4818 ON [id number] BY HENDERSON L. Performed at [facility], [address], [place], [place] [address]    Culture PENDING  Incomplete   Report Status PENDING  Incomplete  Blood Culture (routine x 2)     Status: None (Preliminary result)   Collection Time: 09/28/18  9:40 PM  Result Value Ref Range Status   Specimen Description BLOOD LEFT FOREARM  Final   Special Requests   Final    BOTTLES DRAWN AEROBIC AND ANAEROBIC Blood Culture adequate volume   Culture   Final    NO GROWTH < 12 HOURS Performed at [facility], [address], [place], [place] [address]    Report Status PENDING  Incomplete         Radiology Studies: Dg Tibia/fibula Left  Result Date: 09/28/2018 CLINICAL DATA:  Cellulitis EXAM: LEFT TIBIA AND FIBULA - 2 VIEW COMPARISON:  None. FINDINGS: Diffuse soft tissue swelling without soft tissue emphysema. No acute fracture or malalignment. No periostitis or bone destruction. IMPRESSION: No  acute osseous abnormality Electronically Signed   By: Donavan Foil M.D.   On: 09/28/2018 23:00   US Arterial Abi (screening Lower Extremity)  Result Date: 09/29/2018 CLINICAL DATA:  Nonhealing left diabetic ulcer, cellulitis. Left lower extremity rest pain and claudication. Diabetes, hyperlipidemia, obesity, previous tobacco abuse. EXAM: NONINVASIVE PHYSIOLOGIC VASCULAR STUDY OF BILATERAL LOWER EXTREMITIES TECHNIQUE: Evaluation of both lower extremities were performed at rest, including calculation of ankle-brachial indices with single level Doppler, pressure and pulse volume recording. COMPARISON:  None. FINDINGS: Right ABI:  1.08 Left ABI:  1.11 Right Lower Extremity:  Monophasic arterial waveforms distally. Left Lower Extremity:  Monophasic arterial waveforms distally. IMPRESSION: Normal bilateral ABIs. Vascular calcification in the setting of diabetes (medial arterial sclerosis of [place]cke[place]) can result in decreased vascular compressibility, and nondiagnostic ABIs. Should the patient fail conservative treatment, consider CTA runoff (higher spatial resolution) or MRA runoff (no radiation risk, can be performed noncontrast in the setting of renal dysfunction) to better define the site and nature of arterial occlusive disease and delineate treatment options. Electronically Signed   By: Lucrezia Europe M.D.   On: 09/29/2018 11:03   Dg Chest Port 1 View  Result Date: 09/28/2018 CLINICAL DATA:  fever, chills onset today. Left lower leg pain and redness-surgery a few months ago. History of DM, COPD, tachycardia. EXAM: PORTABLE CHEST 1 VIEW COMPARISON:  06/16/2015 FINDINGS: Cardiac silhouette is normal in size. No mediastinal or hilar masses or evidence of adenopathy. Clear lungs.  No pleural effusion or pneumothorax. Stable changes from a previous anterior cervical spine fusion. Skeletal structures are grossly intact. IMPRESSION: No active disease. Electronically Signed   By: Lajean Manes M.D.   On: 09/28/2018  21:53   Dg Foot Complete Left  Result Date: 09/28/2018 CLINICAL DATA:  Cellulitis EXAM: LEFT FOOT - COMPLETE 3+ VIEW COMPARISON:  03/16/2018, 03/01/2018 FINDINGS: No acute displaced fracture is seen. Amputation of the distal fifth metatarsal with smooth cut margins. Osteopenia at the base of the fifth proximal phalanx with possible loss of cortex on oblique view. Skin thickening and probable ulcer at the level of the  amputation. Moderate plantar calcaneal spur. IMPRESSION: 1. Status post amputation of distal fifth metatarsal. Osteopenia with possible subtle cortical loss at the base of the fifth middle phalanx, early changes of osteomyelitis cannot be excluded. 2. Soft tissue ulcer plantar/lateral aspect of the foot at the level of the amputation. Electronically Signed   By: Donavan Foil M.D.   On: 09/28/2018 23:03      Scheduled Meds: . baclofen  20 mg Oral BID  . docusate sodium  200 mg Oral QPM  . ezetimibe  10 mg Oral Daily  . feeding supplement (PRO-STAT SUGAR FREE 64)  30 mL Oral TID BM  . gabapentin  800 mg Oral QID  . heparin  5,000 Units Subcutaneous Q8H  . insulin aspart  0-5 Units Subcutaneous QHS  . insulin aspart  0-9 Units Subcutaneous TID WC  . insulin detemir  35 Units Subcutaneous BID  . lamoTRIgine  25 mg Oral BID  . levothyroxine  75 mcg Oral Q0600  . multivitamin with minerals  1 tablet Oral Daily  . nutrition supplement (JUVEN)  1 packet Oral BID BM  . rOPINIRole  0.5 mg Oral QHS  . sodium chloride flush  3 mL Intravenous Q12H  . vitamin B-12  1,000 mcg Oral Daily   Continuous Infusions: . ceFEPime (MAXIPIME) IV 2 g (09/29/18 0535)  . metronidazole 500 mg (09/29/18 1253)  . vancomycin 1,000 mg (09/29/18 0837)     LOS: 1 day    Time spent in minutes: Schuyler, MD [facility] Pager: [email or web address] Password [email or web address] 09/29/2018, 1:05 PM

## 2018-09-29 NOTE — Progress Notes (Signed)
CRITICAL VALUE ALERT  Critical Value:  Lactic Acid 2.5  Date & Time Notied:  09/29/18 0030  Provider Notified: Opyd  Orders Received/Actions taken:

## 2018-09-29 NOTE — Progress Notes (Signed)
CRITICAL VALUE ALERT  Critical Value:  Blood cultures: gram (+) cocci, BCID:Strep.   Date & Time Notied:  09/29/18, 1625.  Provider Notified: Wynelle Cleveland, MD.  Orders Received/Actions taken: D/C Vancomycin, continue other antibiotics.

## 2018-09-29 NOTE — Progress Notes (Signed)
Pharmacy Antibiotic Note  Katherine Herrera is a 52 y.o. female admitted on 09/28/2018 with cellulitis.  Pharmacy has been consulted for Vancomycin and Cefepime dosing.  Plan: Vancomycin 1500 mg IV x 1 dose. Vancomycin 1000 mg  IV every 12 hours.  Goal trough 10-15 mcg/mL.  Cefepime 2000 mg IV every 8 hours. Monitor labs, c/s, and vanco trough as indicated.  Height: 5\' 9"  (175.3 cm) Weight: 260 lb (117.9 kg) IBW/kg (Calculated) : 66.2  Temp (24hrs), Avg:101.2 F (38.4 C), Min:100.1 F (37.8 C), Max:102.3 F (39.1 C)  Recent Labs  Lab 09/28/18 2112  WBC 19.3*  CREATININE 1.10*  LATICACIDVEN 2.2*    Estimated Creatinine Clearance: 82.1 mL/min (A) (by C-G formula based on SCr of 1.1 mg/dL (H)).    Allergies  Allergen Reactions  . Hydromorphone Hcl Itching  . Cephalexin Nausea And Vomiting  . Demerol  [Meperidine Hcl] Rash  . Lovastatin Other (See Comments)    Other reaction(s): Unknown (See Comments) Possible myalgia Unknown   . Metformin And Related Nausea And Vomiting  . Sulfamethoxazole-Trimethoprim     Other reaction(s): Unknown DILI, pancreatitis  . Crestor [Rosuvastatin Calcium]     myalgia  . Niacin Other (See Comments)    Unknown   . Paroxetine Other (See Comments)    Other reaction(s): Unknown (See Comments) unknown Unknown     Antimicrobials this admission: Vanco 11/29 Cefepime 11/29 >>   Dose adjustments this admission: N/A  Microbiology results: 11/29 BCx: pending 11/29 UCx: pending    Thank you for allowing pharmacy to be a part of this patient's care.  Ramond Craver 09/29/2018 12:04 AM

## 2018-09-29 NOTE — Progress Notes (Addendum)
Initial Nutrition Assessment  DOCUMENTATION CODES:  Obesity unspecified  INTERVENTION:  To help patient meet protein needs, Will order 30 mL Prostat TID, each supplement provides 100 kcal and 15 grams of protein.  Will order Juven supplement which contains CaHMB, Arginine, Glutamine and collagen to promote wound healing/tissue granulation.   Depending on a1C, may need reeducation on DM diet to help wound healing.   Mvi with minerals to ensure adequate substrate for wound healing  NUTRITION DIAGNOSIS:  Increased nutrient needs related to wound healing as evidenced by the estimated nutrition requirements for this outcome.   GOAL:  Patient will meet greater than or equal to 90% of their needs  MONITOR:  Labs, Weight trends, PO intake, Diet advancement, I & O's, Supplement acceptance, Skin   REASON FOR ASSESSMENT:  Consult Wound healing   ASSESSMENT:  52 y/o female PMHx IDDM, HTN/HLD, tobacco abuse, Hypothyroidism, chronic pain, COPD, Anxiety and diabetic ulcers involving bilateral feet. Presents to ED w/ fevers, chills and L ankle pain, swelling and redness. Workup consistent with sepsis 2/2 cellulitis/foot ulcers. Admitted for management.  RD operating remotely on weekends. Per review of documentation available, there is no report of patient having any decreased appetite or weight changes PTA. There also is no documentation of n/v/c/d or other GI upset  Per weight history in chart, the patient appears to have gained a exceedingly large amount of weight in just the past couple months. She was admitted at 260 lbs, which is a gain of 35 lbs in just 2 months. Admission wt highly suspicious for being reported or estimated. Will use IBW for dosing.   Long term, it appears her weight had been 240-265 lbs from 2012-2017. She appears to have gradually been losing weight since and had lost 20-40 lbs in the past couple years.   There is no documented PO intake as of yet. Will preemptively add  oral supplements just in case. +Juven to support wound healing.   A1c currently pending. Last a1c in chart 10.3. Taken in January. If A1c still this high, will assuredly need reeducation on DM diet as she is at risk for nonhealing of wounds/ eventual amputation  Labs: BGs: 170-220, Lactic Acid: 2.5 Meds: Colace, Baclofen, B12, Insulin, Requip  Recent Labs  Lab 09/28/18 2112 09/29/18 0656  NA 131* 135  K 4.6 4.1  CL 93* 102  CO2 27 25  BUN 12 13  CREATININE 1.10* 0.94  CALCIUM 9.1 7.9*  GLUCOSE 231* 211*   NUTRITION - FOCUSED PHYSICAL EXAM: Unable to conduct  Diet Order:   Diet Order            Diet heart healthy/carb modified Room service appropriate? Yes; Fluid consistency: Thin  Diet effective now             EDUCATION NEEDS:  No education needs have been identified at this time  Skin: Cellulitis to LLE   Last BM: 11/29  Height:  Ht Readings from Last 1 Encounters:  09/28/18 5\' 9"  (1.753 m)   Weight:  Wt Readings from Last 1 Encounters:  09/28/18 117.9 kg   Wt Readings from Last 10 Encounters:  09/28/18 117.9 kg  07/30/18 102.1 kg  04/27/18 100.2 kg  01/05/18 104.8 kg  11/13/17 104.9 kg  10/06/17 98 kg  08/09/17 93.4 kg  08/02/17 94.3 kg  06/12/17 96.6 kg  05/19/17 98 kg   Ideal Body Weight:  65.91 kg  BMI:  Body mass index is 38.4 kg/m.  Estimated Nutritional Needs:  Kcal:  1850-2100 kcals (28-32 kcal/kg bw) Protein:  92-105g Pro (1.4-1.6 g/kg ibw) Fluid:  1.9-2.1 L fluid (1 ml/kcal)   Burtis Junes RD, LDN, CNSC Clinical Nutrition Available Tues-Sat via Pager: [phone] 09/29/2018 9:39 AM

## 2018-09-29 NOTE — Progress Notes (Signed)
Inpatient Diabetes Program Recommendations  AACE/ADA: New Consensus Statement on Inpatient Glycemic Control (2019)  Target Ranges:  Prepandial:   less than 140 mg/dL      Peak postprandial:   less than 180 mg/dL (1-2 hours)      Critically ill patients:  140 - 180 mg/dL   Results for LILLYAUNA, JENKINSON (MRN [id number]) as of 09/29/2018 07:44  Ref. Range 09/28/2018 21:31 09/29/2018 00:16 09/29/2018 07:38  Glucose-Capillary Latest Ref Range: 70 - 99 mg/dL 219 (H) 173 (H) 208 (H)  Results for KAMMI, HECHLER (MRN [id number]) as of 09/29/2018 07:44  Ref. Range 11/13/2017 14:39  Hemoglobin A1C Unknown 10.3   Review of Glycemic Control  Diabetes history: DM2 Outpatient Diabetes medications: 70/30 60 units BID Current orders for Inpatient glycemic control: Levemir 35 units BID, Novolog 0-9 units TID with meals, Novolog 0-5 units QHS  Inpatient Diabetes Program Recommendations:  HbgA1C: A1C in process.  NOTE: Noted consult for Diabetes Coordinator. Diabetes Coordinator is not on campus over the weekend but available by pager from 8am to 5pm for questions or concerns. Chart reviewed. Noted patient was seeing Dr. Loanne Drilling (Endocrinologist) for DM control and last office visited noted in chart was on 11/13/17 and last A1C at that time was 10.3%. No recommendations at this time. Will follow along.  Thanks, Barnie Alderman, RN, MSN, CDE Diabetes Coordinator Inpatient Diabetes Program [phone] (Team Pager from 8am to 5pm)

## 2018-09-29 NOTE — H&P (Signed)
History and Physical    Katherine Herrera YKD:[id number] DOB: [birth year] DOA: 09/28/2018  PCP: Cassandria Anger, MD   Patient coming from: Home   Chief Complaint: Fever, chills, and left lower leg pain, swelling, and erythema   HPI: Katherine Herrera is a 52 y.o. female with medical history significant for insulin-dependent diabetes mellitus, hypertension, hypothyroidism, chronic pain, and diabetic ulcers involving bilateral feet status post resection of the distal fifth metatarsal on the left, now presenting to the emergency department for evaluation of fevers, chills, and left ankle pain, swelling, and redness.  Patient reports experiencing some shooting pains from the left foot over the past couple days, did not think much of it at the time, but developed fevers and chills today with worsening pain in the left foot and ankle, and noticed new redness and swelling involving the lower left leg.  There has not been any drainage from her ulcers.  She denies any chest pain, palpitations, shortness of breath, or cough.  She denies dysuria or flank pain.  She has not been on antibiotics recently.  ED Course: Upon arrival to the ED, patient is found to be febrile to 39.1 C, saturating well on room air, tachycardic in the 130s, and with stable blood pressure.  EKG features sinus tachycardia with rate 119 and repolarization abnormality.  Chest x-ray is negative for acute cardiopulmonary disease.  Chemistry panel is notable for sodium of 131, glucose 231, and creatinine 1.10, slightly higher than in September.  CBC features a leukocytosis to 19,300.  Lactic acid is elevated to 2.2.  Radiographs of the left foot and lower leg are notable for prior resection of the distal fifth metatarsal on the left and possible subtle cortical loss at the base of the fifth middle phalanx, unable to exclude early osteomyelitis.  Blood cultures were collected, 1 L of normal saline given, pain medications administered, and the  patient was treated with empiric vancomycin, cefepime, and Flagyl in the ED.  Heart rate improved, blood pressure remained stable, and the patient will be admitted for ongoing evaluation and management.  Review of Systems:  All other systems reviewed and apart from HPI, are negative.  Past Medical History:  Diagnosis Date  . Anxiety   . COPD (chronic obstructive pulmonary disease) ([place])   . DM (diabetes mellitus) ([place])   . Gout    possible  . Hyperlipidemia   . Hypothyroidism   . LBP (low back pain)    DR Patrice Paradise  . Migraine   . Peripheral neuropathy   . Syncope    poss conversion disorder vs psychogenic seizures  . Tachycardia    diet coke overuse?  Marland Kitchen Tenosynovitis of foot and ankle 09/03/2013  . TTS (tarsal tunnel syndrome) 2010   Left Foot [place] in [place]  . Vitamin D deficiency     Past Surgical History:  Procedure Laterality Date  . ABDOMINAL HYSTERECTOMY    . CARPAL TUNNEL RELEASE  2010   LEFT  . CERVICAL FUSION     C4-5     reports that she has been smoking. She has been smoking about 1.00 pack per day. She has never used smokeless tobacco. She reports that she does not drink alcohol or use drugs.  Allergies  Allergen Reactions  . Hydromorphone Hcl Itching  . Cephalexin Nausea And Vomiting  . Demerol  [Meperidine Hcl] Rash  . Lovastatin Other (See Comments)    Other reaction(s): Unknown (See Comments) Possible myalgia Unknown   . Metformin And  Related Nausea And Vomiting  . Sulfamethoxazole-Trimethoprim     Other reaction(s): Unknown DILI, pancreatitis  . Crestor [Rosuvastatin Calcium]     myalgia  . Niacin Other (See Comments)    Unknown   . Paroxetine Other (See Comments)    Other reaction(s): Unknown (See Comments) unknown Unknown     Family History  Problem Relation Age of Onset  . Diabetes Mother   . Hypertension Mother   . Cancer Father 43       ? bone cancer  . [name] Brother      Prior to Admission medications   Medication Sig  Start Date End Date Taking? Authorizing Provider  aspirin (BAYER ASPIRIN) 325 MG tablet Take 1 tablet (325 mg total) by mouth daily. 06/16/15  Yes Plotnikov, [name], MD  baclofen (LIORESAL) 10 MG tablet TAKE 2 TABLETS BY MOUTH TWO TIMES DAILY Patient taking differently: Take 20 mg by mouth 2 (two) times daily.  10/06/17  Yes Plotnikov, [name], MD  Cholecalciferol (VITAMIN D3) 1000 UNIT tablet Take 1,000 Units by mouth daily.    Yes [provider]  clonazePAM (KLONOPIN) 0.5 MG tablet TAKE ONE TABLET BY MOUTH 2 TIMES A DAY AS NEEDED FOR ANXIETY Patient taking differently: Take 0.5 mg by mouth 2 (two) times daily as needed for anxiety.  12/28/17  Yes Plotnikov, [name], MD  docusate sodium (COLACE) 100 MG capsule Take 200 mg by mouth every evening.   Yes [provider]  ezetimibe (ZETIA) 10 MG tablet TAKE ONE TABLET BY MOUTH EVERY DAY Patient taking differently: Take 10 mg by mouth daily.  06/13/18  Yes Plotnikov, [name], MD  gabapentin (NEURONTIN) 800 MG tablet TAKE 1 TABLET BY MOUTH FOUR TIMES DAILY Patient taking differently: Take 800 mg by mouth 4 (four) times daily.  04/18/18  Yes Renato Shin, MD  insulin NPH-regular Human (NOVOLIN 70/30) (70-30) 100 UNIT/ML injection 60 units with breakfast, and 35 units with supper, and syringes Patient taking differently: Inject 60 Units into the skin 2 (two) times daily with a meal.  11/13/17  Yes Renato Shin, MD  lamoTRIgine (LAMICTAL) 25 MG tablet TAKE 1 TABLET BY MOUTH TWO  TIMES DAILY Patient taking differently: Take 25 mg by mouth 2 (two) times daily.  04/18/18  Yes Plotnikov, [name], MD  levothyroxine (SYNTHROID, LEVOTHROID) 75 MCG tablet Take 1 tablet (75 mcg total) by mouth daily. 04/27/18  Yes Plotnikov, [name], MD  lisinopril (PRINIVIL,ZESTRIL) 10 MG tablet TAKE ONE TABLET BY MOUTH EVERY DAY Patient taking differently: Take 10 mg by mouth daily.  09/17/18  Yes Plotnikov, [name], MD  loratadine (CLARITIN) 10 MG  tablet Take 10 mg by mouth daily as needed for allergies.   Yes [provider]  oxyCODONE (ROXICODONE) 15 MG immediate release tablet Take 1 tablet (15 mg total) by mouth every 6 (six) hours as needed for pain. 08/29/18  Yes Plotnikov, [name], MD  rOPINIRole (REQUIP) 0.5 MG tablet TAKE ONE TABLET BY MOUTH AT BEDTIME Patient taking differently: Take 0.5 mg by mouth at bedtime.  02/21/18  Yes Plotnikov, [name], MD  vitamin B-12 (CYANOCOBALAMIN) 1000 MCG tablet Take 1,000 mcg by mouth daily.     Yes [provider]  B-D INS SYR ULTRAFINE 1CC/31G 31G X 5/16" 1 ML MISC USE AS DIRECTED 09/08/16   Renato Shin, MD  glucose blood (ONETOUCH VERIO) test strip Use to check blood sugar 2 times per day. 06/24/16   Renato Shin, MD  Insulin Syringe-Needle U-100 (SURE COMFORT INSULIN SYRINGE) 31G X 5/16" 0.5 ML MISC USE AS DIRECTED WITH INSULIN 4 TIMES A DAY 09/08/17   Renato Shin, MD  Lancet Devices (ACCU-CHEK [facility]) lancets Test two times daily 07/01/16   Renato Shin, MD  [facility] DELICA LANCETS 30Q [place] TEST TWO TIMES DAILY 04/18/18   Renato Shin, MD    Physical Exam: Vitals:   09/28/18 2300 09/28/18 2313 09/28/18 2330 09/29/18 0005  BP: 111/62  116/67 (!) 102/57  Pulse: (!) 119  (!) 114 (!) 109  Resp: 16  20 16   Temp:  100.1 F (37.8 C)  100.1 F (37.8 C)  TempSrc:  Oral  Oral  SpO2: 94%  97% 98%  Weight:      Height:        Constitutional: NAD, calm  Eyes: PERTLA, lids and conjunctivae normal ENMT: Mucous membranes are moist. Posterior pharynx clear of any exudate or lesions.   Neck: normal, supple, no masses, no thyromegaly Respiratory: clear to auscultation bilaterally, no wheezing, no crackles. Normal respiratory effort.   Cardiovascular: S1 & S2 heard, regular rate and rhythm. No extremity edema.   Abdomen: No distension, no tenderness, soft. Bowel sounds normal.  Musculoskeletal: no clubbing / cyanosis. No joint deformity upper and lower extremities.      Skin: Callous and ulceration at lateral mid foot bilaterally. Heat, erythema, and edema involving lower left leg and extending to mid-shin. Warm, dry, well-perfused. Neurologic: No facial asymmetry. Sensation diminished in feet bilaterally. Moving all extremities.  Psychiatric: Alert and oriented to person, place, and situation. Calm, coopperative.    Labs on Admission: I have personally reviewed following labs and imaging studies  CBC: Recent Labs  Lab 09/28/18 2112  WBC 19.3*  NEUTROABS 17.1*  HGB 14.2  HCT 45.3  MCV 88.0  PLT 762   Basic Metabolic Panel: Recent Labs  Lab 09/28/18 2112  NA 131*  K 4.6  CL 93*  CO2 27  GLUCOSE 231*  BUN 12  CREATININE 1.10*  CALCIUM 9.1   GFR: Estimated Creatinine Clearance: 82.1 mL/min (A) (by C-G formula based on SCr of 1.1 mg/dL (H)). Liver Function Tests: Recent Labs  Lab 09/28/18 2112  AST 16  ALT 15  ALKPHOS 106  BILITOT 0.5  PROT 9.2*  ALBUMIN 4.3   No results for input(s): LIPASE, AMYLASE in the last 168 hours. No results for input(s): AMMONIA in the last 168 hours. Coagulation Profile: No results for input(s): INR, PROTIME in the last 168 hours. Cardiac Enzymes: No results for input(s): CKTOTAL, CKMB, CKMBINDEX, TROPONINI in the last 168 hours. BNP (last 3 results) No results for input(s): PROBNP in the last 8760 hours. HbA1C: No results for input(s): HGBA1C in the last 72 hours. CBG: Recent Labs  Lab 09/28/18 2131 09/29/18 0016  GLUCAP 219* 173*   Lipid Profile: No results for input(s): CHOL, HDL, LDLCALC, TRIG, CHOLHDL, LDLDIRECT in the last 72 hours. Thyroid Function Tests: No results for input(s): TSH, T4TOTAL, FREET4, T3FREE, THYROIDAB in the last 72 hours. Anemia Panel: No results for input(s): VITAMINB12, FOLATE, FERRITIN, TIBC, IRON, RETICCTPCT in the last 72 hours. Urine analysis:    Component Value Date/Time   COLORURINE YELLOW 09/28/2018 2318   APPEARANCEUR CLEAR 09/28/2018 2318   LABSPEC  1.009 09/28/2018 2318   PHURINE 7.0 09/28/2018 2318   GLUCOSEU NEGATIVE 09/28/2018 2318   GLUCOSEU 250 (A) 01/21/2014 1159   HGBUR NEGATIVE 09/28/2018 2318   [place] NEGATIVE 09/28/2018 2318   [place] 09/28/2018 2318  PROTEINUR NEGATIVE 09/28/2018 2318   UROBILINOGEN 1.0 01/21/2014 1159   NITRITE NEGATIVE 09/28/2018 2318   LEUKOCYTESUR NEGATIVE 09/28/2018 2318   Sepsis Labs: @LABRCNTIP (procalcitonin:4,lacticidven:4) ) Recent Results (from the past 240 hour(s))  Blood Culture (routine x 2)     Status: None (Preliminary result)   Collection Time: 09/28/18  9:12 PM  Result Value Ref Range Status   Specimen Description RIGHT ANTECUBITAL  Final   Special Requests   Final    BOTTLES DRAWN AEROBIC AND ANAEROBIC Blood Culture adequate volume Performed at [facility], [address] [place], [place] [address]    Culture PENDING  Incomplete   Report Status PENDING  Incomplete  Blood Culture (routine x 2)     Status: None (Preliminary result)   Collection Time: 09/28/18  9:40 PM  Result Value Ref Range Status   Specimen Description BLOOD LEFT FOREARM  Final   Special Requests   Final    BOTTLES DRAWN AEROBIC AND ANAEROBIC Blood Culture adequate volume Performed at [facility], [address] [place], [place] [address]    Culture PENDING  Incomplete   Report Status PENDING  Incomplete     Radiological Exams on Admission: Dg Tibia/fibula Left  Result Date: 09/28/2018 CLINICAL DATA:  Cellulitis EXAM: LEFT TIBIA AND FIBULA - 2 VIEW COMPARISON:  None. FINDINGS: Diffuse soft tissue swelling without soft tissue emphysema. No acute fracture or malalignment. No periostitis or bone destruction. IMPRESSION: No acute osseous abnormality Electronically Signed   By: Donavan Foil M.D.   On: 09/28/2018 23:00   Dg Chest Port 1 View  Result Date: 09/28/2018 CLINICAL DATA:  fever, chills onset today. Left lower leg pain and redness-surgery a few months ago. History of DM, COPD,  tachycardia. EXAM: PORTABLE CHEST 1 VIEW COMPARISON:  06/16/2015 FINDINGS: Cardiac silhouette is normal in size. No mediastinal or hilar masses or evidence of adenopathy. Clear lungs.  No pleural effusion or pneumothorax. Stable changes from a previous anterior cervical spine fusion. Skeletal structures are grossly intact. IMPRESSION: No active disease. Electronically Signed   By: Lajean Manes M.D.   On: 09/28/2018 21:53   Dg Foot Complete Left  Result Date: 09/28/2018 CLINICAL DATA:  Cellulitis EXAM: LEFT FOOT - COMPLETE 3+ VIEW COMPARISON:  03/16/2018, 03/01/2018 FINDINGS: No acute displaced fracture is seen. Amputation of the distal fifth metatarsal with smooth cut margins. Osteopenia at the base of the fifth proximal phalanx with possible loss of cortex on oblique view. Skin thickening and probable ulcer at the level of the amputation. Moderate plantar calcaneal spur. IMPRESSION: 1. Status post amputation of distal fifth metatarsal. Osteopenia with possible subtle cortical loss at the base of the fifth middle phalanx, early changes of osteomyelitis cannot be excluded. 2. Soft tissue ulcer plantar/lateral aspect of the foot at the level of the amputation. Electronically Signed   By: Donavan Foil M.D.   On: 09/28/2018 23:03    EKG: Independently reviewed. Sinus tachycardia, non-specific repolarization abnormality.   Assessment/Plan   1. Sepsis secondary to cellulitis; diabetic foot ulcers - Presents with fever, chills, and left ankle redness, swelling, and pain  - She is found to be febrile with tachycardia, leukocytosis, and mild lactate elevation  - She has diabetic foot ulcers, is s/p resection of distal left 5th metatarsal, and plain films unable to exclude acute osteomyelitis  - Blood cultures collected in ED, fluid bolus given, and she was treated with cefepime, vancomycin, and Flagyl   - Check ABI's and inflammatory markers, continue current antibiotics while  following cultures, consider  advanced imaging and/or surgical consultation based on inflammatory markers and clinical course  2. Mild renal insufficiency  - SCr is 1.10, slightly up from September  - Continue IVF hydration, hold lisinopril for now, repeat chem panel in am   3. Hyponatremia  - Serum sodium is 131 on admission in setting of hypovolemia  - Given 1 liter NS in ED and continued on NS infusion  - Repeat chem panel in am    4. Insulin-dependent DM  - A1c was 10.3% in January '19  - Managed at home with insulin NPH 60 units BID  - Check CBG's, use Levemir with Novolog correctional while in hospital    5. Hypothyroidism  - Continue Synthroid    6. Chronic pain  - Continue home-regimen with baclofen, gabapentin, and prn oxycodone    7. Hypertension  - BP at goal  - Lisinopril held on admission d/t elevated creatinine     DVT prophylaxis: sq heparin  Code Status: Full  Family Communication: Daughter updated at bedside Consults called: None Admission status: Inpatient     Vianne Bulls, MD [facility] Pager [phone]  If 7PM-7AM, please contact night-coverage [email or web address] Password [email or web address]  09/29/2018, 12:24 AM

## 2018-09-30 DIAGNOSIS — E08621 Diabetes mellitus due to underlying condition with foot ulcer: Secondary | ICD-10-CM

## 2018-09-30 DIAGNOSIS — L97512 Non-pressure chronic ulcer of other part of right foot with fat layer exposed: Secondary | ICD-10-CM

## 2018-09-30 LAB — HIV ANTIBODY (ROUTINE TESTING W REFLEX): HIV Screen 4th Generation wRfx: NONREACTIVE

## 2018-09-30 LAB — GLUCOSE, CAPILLARY
Glucose-Capillary: 182 mg/dL — ABNORMAL HIGH (ref 70–99)
Glucose-Capillary: 256 mg/dL — ABNORMAL HIGH (ref 70–99)
Glucose-Capillary: 173 mg/dL — ABNORMAL HIGH (ref 70–99)
Glucose-Capillary: 274 mg/dL — ABNORMAL HIGH (ref 70–99)

## 2018-09-30 LAB — URINE CULTURE: Culture: NO GROWTH

## 2018-09-30 LAB — TSH: TSH: 1.151 u[IU]/mL (ref 0.350–4.500)

## 2018-09-30 LAB — AMMONIA: Ammonia: 18 umol/L (ref 9–35)

## 2018-09-30 LAB — VITAMIN B12: Vitamin B-12: 1060 pg/mL — ABNORMAL HIGH (ref 180–914)

## 2018-09-30 LAB — PHOSPHORUS: Phosphorus: 3.1 mg/dL (ref 2.5–4.6)

## 2018-09-30 LAB — MAGNESIUM: Magnesium: 2.1 mg/dL (ref 1.7–2.4)

## 2018-09-30 MED ORDER — OXYCODONE HCL 5 MG PO TABS
30.0000 mg | ORAL_TABLET | Freq: Four times a day (QID) | ORAL | Status: DC | PRN
  Administered 2018-09-30 – 2018-10-03 (×8): 30 mg via ORAL
  Filled 2018-09-30 (×8): qty 6

## 2018-09-30 MED ORDER — SODIUM CHLORIDE 0.9 % IV SOLN
2.0000 g | INTRAVENOUS | Status: AC
  Administered 2018-09-30 – 2018-10-04 (×5): 2 g via INTRAVENOUS
  Filled 2018-09-30: qty 2
  Filled 2018-09-30 (×4): qty 20

## 2018-09-30 NOTE — Progress Notes (Signed)
PROGRESS NOTE    Katherine Herrera  TKZ:[id number] DOB: 05-30-1966 DOA: 09/28/2018 PCP: Cassandria Anger, MD    Brief Narrative:  52 year old female with a history of insulin-dependent diabetes, hypertension, hypothyroidism, chronic pain syndrome, diabetic neuropathy in her feet, recent resection of her distal fifth metatarsal on the left, presented to the emergency room with fevers, chills and ankle pain.  She was found to have swelling and redness in her left lower extremity consistent with a cellulitis.  She was admitted for further treatments.  She also complains of numbness/tingling in her hands bilaterally with occasional jerking for the past few days.   Assessment & Plan:   Principal Problem:   Sepsis due to cellulitis [facility]) Active Problems:   Hypothyroidism   Anxiety state   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)   Ulcer of foot due to diabetes mellitus (HCC)   Mild renal insufficiency   Hyponatremia   Chronic pain   Cellulitis of left lower leg   Diabetic foot ulcer ([place])   1. Sepsis.  Related to cellulitis.  Blood cultures show positive for Streptococcus.  Continue on ceftriaxone.  Follow-up sensitivities.  Hemodynamics are stable. 2. Cellulitis of left lower leg.  Does not appear to be progressing beyond marked borders.  Continue on intravenous antibiotics, keep elevated.  ABIs done bilaterally are greater than 1.  X-ray of left tibia/fibula does not show any deep infection.  On x-ray of her left foot, it could not rule out underlying osteomyelitis.  Clinically, she does not appear to have any ongoing infection of her foot.  Cellulitis did not appear to extend down to her foot.  She does have a callus on the plantar aspect of her foot, but this does not appear to be actively infected.  Only chronic changes noted in left foot. 3. Bilateral hand paresthesias/numbness.  Checking electrolytes (potassium, magnesium, phosphorus, calcium) as well as TSH, B12,  ammonia.  She notes occasional jerking in her arms.  Appear to be myoclonic.  Continue to monitor.  She will does also complain of swelling in her right hand.  Will check venous ultrasound.  If weakness/paresthesias persist, can consider MRI of her C/T-spine.  Approximately, in her upper arm and shoulder she does not have any symptoms.  Seems to be more of a peripheral neuropathy. 4. Anxiety.  Continue on clonazepam and Lamictal. 5. Chronic pain syndrome.  Continue on oxycodone, Neurontin and baclofen. 6. Hypothyroidism.  Continue Synthroid 7. Diabetes.  Continued on Lantus and 70/30 insulin.  Blood sugar stable.  A1c is 8.5   DVT prophylaxis: Heparin Code Status: Full code Family Communication: No family present Disposition Plan: Discharge home once improved   Consultants:     Procedures:     Antimicrobials:   Cefepime 11/29 > 12/1  Flagyl 11/29 > 12/1  Ceftriaxone 12/1 >   Subjective: Reports continued pain in her left leg.  Feels that she has had numbness and paresthesias in her hands bilaterally over the past 2 days.  Objective: Vitals:   09/29/18 1354 09/29/18 2113 09/30/18 0516 09/30/18 1352  BP: (!) 109/57 (!) 98/58 114/62 100/75  Pulse: (!) 108 (!) 108 89 86  Resp: 19 18 18 18   Temp: 99.9 F (37.7 C) 99.7 F (37.6 C) 98.5 F (36.9 C) 98.8 F (37.1 C)  TempSrc: Oral Oral Oral Oral  SpO2: 95% 92% 97% 100%  Weight:      Height:        Intake/Output Summary (Last 24  hours) at 09/30/2018 1704 Last data filed at 09/30/2018 1400 Gross per 24 hour  Intake 2722.57 ml  Output 1300 ml  Net 1422.57 ml   Filed Weights   09/28/18 2028  Weight: 117.9 kg    Examination:  General exam: Appears calm and comfortable  Respiratory system: Clear to auscultation. Respiratory effort normal. Cardiovascular system: S1 & S2 heard, RRR. No JVD, murmurs, rubs, gallops or clicks. No pedal edema. Gastrointestinal system: Abdomen is nondistended, soft and nontender. No  organomegaly or masses felt. Normal bowel sounds heard. Central nervous system: Alert and oriented. No focal neurological deficits. Extremities: grip strength is decreased bilaterally, strength is 5/5 in upper arms and shoulder shrugs bilaterally. Pulses intact bilaterally Skin: circumferential erythema noted over left tibia/fibula.  There is a callus noted on the plantar surface of left foot at the base of her fifth digit.  No surrounding erythema/drainage/swelling noted in her foot. Psychiatry: she engages in conversation and appears mildly anxious, but is also somnolent at times   Data Reviewed: I have personally reviewed following labs and imaging studies  CBC: Recent Labs  Lab 09/28/18 2112 09/29/18 0656  WBC 19.3* 14.4*  NEUTROABS 17.1* 12.3*  HGB 14.2 12.2  HCT 45.3 39.4  MCV 88.0 90.6  PLT 342 315   Basic Metabolic Panel: Recent Labs  Lab 09/28/18 2112 09/29/18 0656 09/30/18 1144  NA 131* 135  --   K 4.6 4.1  --   CL 93* 102  --   CO2 27 25  --   GLUCOSE 231* 211*  --   BUN 12 13  --   CREATININE 1.10* 0.94  --   CALCIUM 9.1 7.9*  --   MG  --   --  2.1  PHOS  --   --  3.1   GFR: Estimated Creatinine Clearance: 96 mL/min (by C-G formula based on SCr of 0.94 mg/dL). Liver Function Tests: Recent Labs  Lab 09/28/18 2112  AST 16  ALT 15  ALKPHOS 106  BILITOT 0.5  PROT 9.2*  ALBUMIN 4.3   No results for input(s): LIPASE, AMYLASE in the last 168 hours. Recent Labs  Lab 09/30/18 1144  AMMONIA 18   Coagulation Profile: No results for input(s): INR, PROTIME in the last 168 hours. Cardiac Enzymes: No results for input(s): CKTOTAL, CKMB, CKMBINDEX, TROPONINI in the last 168 hours. BNP (last 3 results) No results for input(s): PROBNP in the last 8760 hours. HbA1C: Recent Labs    09/29/18 0656  HGBA1C 8.5*   CBG: Recent Labs  Lab 09/29/18 1548 09/29/18 2047 09/30/18 0726 09/30/18 1144 09/30/18 1631  GLUCAP 125* 180* 182* 256* 173*   Lipid  Profile: No results for input(s): CHOL, HDL, LDLCALC, TRIG, CHOLHDL, LDLDIRECT in the last 72 hours. Thyroid Function Tests: Recent Labs    09/30/18 1144  TSH 1.151   Anemia Panel: Recent Labs    09/30/18 1144  VITAMINB12 1,060*   Sepsis Labs: Recent Labs  Lab 09/28/18 2112 09/28/18 2348  LATICACIDVEN 2.2* 2.5*    Recent Results (from the past 240 hour(s))  Blood Culture (routine x 2)     Status: None (Preliminary result)   Collection Time: 09/28/18  9:12 PM  Result Value Ref Range Status   Specimen Description   Final    BLOOD RIGHT ANTECUBITAL Performed at [facility], [place] [address] [place], [place] [address]    Special Requests   Final    BOTTLES DRAWN AEROBIC AND ANAEROBIC Blood Culture adequate volume  Performed at [facility], [address], [place], [place] [address]    Culture  Setup Time   Final    GRAM POSITIVE COCCI Gram Stain Report Called to,Read Back By and Verified With: FOLEY B. @ 1137 ON [id number] BY HENDERSON L. GRAM STAIN REVIEWED-AGREE WITH RESULT CRITICAL RESULT CALLED TO, READ BACK BY AND VERIFIED WITH: RN BRITTANY FOLEY [phone] FCP    Culture   Final    GRAM POSITIVE COCCI IDENTIFICATION TO FOLLOW Performed at [facility], [place] [address], [place], [place] [address]    Report Status PENDING  Incomplete  Blood Culture ID Panel (Reflexed)     Status: Abnormal   Collection Time: 09/28/18  9:12 PM  Result Value Ref Range Status   Enterococcus species NOT DETECTED NOT DETECTED Final   Listeria monocytogenes NOT DETECTED NOT DETECTED Final   Staphylococcus species NOT DETECTED NOT DETECTED Final   Staphylococcus aureus (BCID) NOT DETECTED NOT DETECTED Final   Streptococcus species DETECTED (A) NOT DETECTED Final    Comment: Not Enterococcus species, Streptococcus agalactiae, Streptococcus pyogenes, or Streptococcus pneumoniae. CRITICAL RESULT CALLED TO, READ BACK BY AND VERIFIED WITH: RN [id number] [place] [phone]  FCP    Streptococcus agalactiae NOT DETECTED NOT DETECTED Final   Streptococcus pneumoniae NOT DETECTED NOT DETECTED Final   Streptococcus pyogenes NOT DETECTED NOT DETECTED Final   Acinetobacter baumannii NOT DETECTED NOT DETECTED Final   Enterobacteriaceae species NOT DETECTED NOT DETECTED Final   Enterobacter cloacae complex NOT DETECTED NOT DETECTED Final   Escherichia coli NOT DETECTED NOT DETECTED Final   Klebsiella oxytoca NOT DETECTED NOT DETECTED Final   Klebsiella pneumoniae NOT DETECTED NOT DETECTED Final   Proteus species NOT DETECTED NOT DETECTED Final   Serratia marcescens NOT DETECTED NOT DETECTED Final   Haemophilus influenzae NOT DETECTED NOT DETECTED Final   Neisseria meningitidis NOT DETECTED NOT DETECTED Final   Pseudomonas aeruginosa NOT DETECTED NOT DETECTED Final   Candida albicans NOT DETECTED NOT DETECTED Final   Candida glabrata NOT DETECTED NOT DETECTED Final   Candida krusei NOT DETECTED NOT DETECTED Final   Candida parapsilosis NOT DETECTED NOT DETECTED Final   Candida tropicalis NOT DETECTED NOT DETECTED Final    Comment: Performed at [facility], [place] [address], [place], [place] [address]  Blood Culture (routine x 2)     Status: None (Preliminary result)   Collection Time: 09/28/18  9:40 PM  Result Value Ref Range Status   Specimen Description BLOOD LEFT FOREARM  Final   Special Requests   Final    BOTTLES DRAWN AEROBIC AND ANAEROBIC Blood Culture adequate volume   Culture   Final    NO GROWTH 2 DAYS Performed at [facility], [address] [place], [place] [address]    Report Status PENDING  Incomplete  Urine culture     Status: None   Collection Time: 09/28/18 11:18 PM  Result Value Ref Range Status   Specimen Description   Final    URINE, CLEAN CATCH Performed at [facility], [address], [place], [place] [address]    Special Requests   Final    NONE Performed at [facility], [address], [place], [place] [address]    Culture    Final    NO GROWTH Performed at [facility], [place] [address], [place],  [address]    Report Status 09/30/2018 FINAL  Final         Radiology Studies:  Dg Tibia/fibula Left  Result Date: 09/28/2018 CLINICAL DATA:  Cellulitis EXAM: LEFT TIBIA AND FIBULA - 2 VIEW COMPARISON:  None. FINDINGS: Diffuse soft tissue swelling without soft tissue emphysema. No acute fracture or malalignment. No periostitis or bone destruction. IMPRESSION: No acute osseous abnormality Electronically Signed   By: Donavan Foil M.D.   On: 09/28/2018 23:00   US Arterial Abi (screening Lower Extremity)  Result Date: 09/29/2018 CLINICAL DATA:  Nonhealing left diabetic ulcer, cellulitis. Left lower extremity rest pain and claudication. Diabetes, hyperlipidemia, obesity, previous tobacco abuse. EXAM: NONINVASIVE PHYSIOLOGIC VASCULAR STUDY OF BILATERAL LOWER EXTREMITIES TECHNIQUE: Evaluation of both lower extremities were performed at rest, including calculation of ankle-brachial indices with single level Doppler, pressure and pulse volume recording. COMPARISON:  None. FINDINGS: Right ABI:  1.08 Left ABI:  1.11 Right Lower Extremity:  Monophasic arterial waveforms distally. Left Lower Extremity:  Monophasic arterial waveforms distally. IMPRESSION: Normal bilateral ABIs. Vascular calcification in the setting of diabetes (medial arterial sclerosis of [place]) can result in decreased vascular compressibility, and nondiagnostic ABIs. Should the patient fail conservative treatment, consider CTA runoff (higher spatial resolution) or MRA runoff (no radiation risk, can be performed noncontrast in the setting of renal dysfunction) to better define the site and nature of arterial occlusive disease and delineate treatment options. Electronically Signed   By: Lucrezia Europe M.D.   On: 09/29/2018 11:03   Dg Chest Port 1 View  Result Date: 09/28/2018 CLINICAL DATA:  fever, chills onset today. Left lower leg pain and  redness-surgery a few months ago. History of DM, COPD, tachycardia. EXAM: PORTABLE CHEST 1 VIEW COMPARISON:  06/16/2015 FINDINGS: Cardiac silhouette is normal in size. No mediastinal or hilar masses or evidence of adenopathy. Clear lungs.  No pleural effusion or pneumothorax. Stable changes from a previous anterior cervical spine fusion. Skeletal structures are grossly intact. IMPRESSION: No active disease. Electronically Signed   By: Lajean Manes M.D.   On: 09/28/2018 21:53   Dg Foot Complete Left  Result Date: 09/28/2018 CLINICAL DATA:  Cellulitis EXAM: LEFT FOOT - COMPLETE 3+ VIEW COMPARISON:  03/16/2018, 03/01/2018 FINDINGS: No acute displaced fracture is seen. Amputation of the distal fifth metatarsal with smooth cut margins. Osteopenia at the base of the fifth proximal phalanx with possible loss of cortex on oblique view. Skin thickening and probable ulcer at the level of the amputation. Moderate plantar calcaneal spur. IMPRESSION: 1. Status post amputation of distal fifth metatarsal. Osteopenia with possible subtle cortical loss at the base of the fifth middle phalanx, early changes of osteomyelitis cannot be excluded. 2. Soft tissue ulcer plantar/lateral aspect of the foot at the level of the amputation. Electronically Signed   By: Donavan Foil M.D.   On: 09/28/2018 23:03        Scheduled Meds: . baclofen  20 mg Oral BID  . docusate sodium  200 mg Oral QPM  . ezetimibe  10 mg Oral Daily  . feeding supplement (PRO-STAT SUGAR FREE 64)  30 mL Oral TID BM  . gabapentin  800 mg Oral QID  . heparin  5,000 Units Subcutaneous Q8H  . insulin aspart  0-9 Units Subcutaneous QAC lunch  . insulin aspart protamine- aspart  60 Units Subcutaneous Q breakfast  . insulin detemir  35 Units Subcutaneous QHS  . lamoTRIgine  25 mg Oral BID  . levothyroxine  75 mcg Oral Q0600  . multivitamin with minerals  1 tablet Oral Daily  . nutrition supplement (JUVEN)  1 packet Oral BID BM  .  rOPINIRole  0.5 mg  Oral QHS  . sodium chloride flush  3 mL Intravenous Q12H  . vitamin B-12  1,000 mcg Oral Daily   Continuous Infusions: . cefTRIAXone (ROCEPHIN)  IV       LOS: 2 days    Time spent: 46mins   Kathie Dike, MD [facility] Pager [phone]  If 7PM-7AM, please contact night-coverage [email or web address] Password [email or web address] 09/30/2018, 5:04 PM

## 2018-09-30 NOTE — Progress Notes (Signed)
PHARMACY - PHYSICIAN COMMUNICATION CRITICAL VALUE ALERT - BLOOD CULTURE IDENTIFICATION (BCID)  Katherine Herrera is an 52 y.o. female who presented to [facility] on [facility]/28/2018 with a chief complaint of  cellulitis.  Assessment:  Sepsis due to cellulitis  Name of physician (or Provider) Contacted:  [facility]  Current antibiotics: cefepime, vancomycin, metronidazole  Changes to prescribed antibiotics recommended: ceftriaxone 2g IV q24h  (negative for S. pneumoniae, S. agalactiae, and S. pyogenes)  Results for orders placed or performed during the hospital encounter of 09/28/18  Blood Culture ID Panel (Reflexed) (Collected: 09/28/2018  9:12 PM)  Result Value Ref Range   Enterococcus species NOT DETECTED NOT DETECTED   Listeria monocytogenes NOT DETECTED NOT DETECTED   Staphylococcus species NOT DETECTED NOT DETECTED   Staphylococcus aureus (BCID) NOT DETECTED NOT DETECTED   Streptococcus species DETECTED (A) NOT DETECTED   Streptococcus agalactiae NOT DETECTED NOT DETECTED   Streptococcus pneumoniae NOT DETECTED NOT DETECTED   Streptococcus pyogenes NOT DETECTED NOT DETECTED   Acinetobacter baumannii NOT DETECTED NOT DETECTED   Enterobacteriaceae species NOT DETECTED NOT DETECTED   Enterobacter cloacae complex NOT DETECTED NOT DETECTED   Escherichia coli NOT DETECTED NOT DETECTED   Klebsiella oxytoca NOT DETECTED NOT DETECTED   Klebsiella pneumoniae NOT DETECTED NOT DETECTED   Proteus species NOT DETECTED NOT DETECTED   Serratia marcescens NOT DETECTED NOT DETECTED   Haemophilus influenzae NOT DETECTED NOT DETECTED   Neisseria meningitidis NOT DETECTED NOT DETECTED   Pseudomonas aeruginosa NOT DETECTED NOT DETECTED   Candida albicans NOT DETECTED NOT DETECTED   Candida glabrata NOT DETECTED NOT DETECTED   Candida krusei NOT DETECTED NOT DETECTED   Candida parapsilosis NOT DETECTED NOT DETECTED   Candida tropicalis NOT DETECTED NOT DETECTED    Despina Pole 09/30/2018  10:31 AM

## 2018-10-01 ENCOUNTER — Inpatient Hospital Stay (HOSPITAL_COMMUNITY): Payer: Medicare Other

## 2018-10-01 DIAGNOSIS — E1142 Type 2 diabetes mellitus with diabetic polyneuropathy: Secondary | ICD-10-CM | POA: Diagnosis present

## 2018-10-01 LAB — BASIC METABOLIC PANEL
Anion gap: 7 (ref 5–15)
BUN: 24 mg/dL — ABNORMAL HIGH (ref 6–20)
CO2: 27 mmol/L (ref 22–32)
Calcium: 8.4 mg/dL — ABNORMAL LOW (ref 8.9–10.3)
Chloride: 101 mmol/L (ref 98–111)
Creatinine, Ser: 1.06 mg/dL — ABNORMAL HIGH (ref 0.44–1.00)
GFR calc Af Amer: >60 mL/min (ref >60)
GFR calc non Af Amer: >60 mL/min (ref >60)
Glucose, Bld: 184 mg/dL — ABNORMAL HIGH (ref 70–99)
Potassium: 4.5 mmol/L (ref 3.5–5.1)
Sodium: 135 mmol/L (ref 135–145)

## 2018-10-01 LAB — CBC
HCT: 37.2 % (ref 36.0–46.0)
Hemoglobin: 11.4 g/dL — ABNORMAL LOW (ref 12.0–15.0)
MCH: 27.9 pg (ref 26.0–34.0)
MCHC: 30.6 g/dL (ref 30.0–36.0)
MCV: 91 fL (ref 80.0–100.0)
Platelets: 232 10*3/uL (ref 150–400)
RBC: 4.09 MIL/uL (ref 3.87–5.11)
RDW: 12.5 % (ref 11.5–15.5)
WBC: 10.3 10*3/uL (ref 4.0–10.5)
nRBC: 0 % (ref 0.0–0.2)

## 2018-10-01 LAB — GLUCOSE, CAPILLARY
Glucose-Capillary: 170 mg/dL — ABNORMAL HIGH (ref 70–99)
Glucose-Capillary: 61 mg/dL — ABNORMAL LOW (ref 70–99)
Glucose-Capillary: 136 mg/dL — ABNORMAL HIGH (ref 70–99)
Glucose-Capillary: 148 mg/dL — ABNORMAL HIGH (ref 70–99)
Glucose-Capillary: 173 mg/dL — ABNORMAL HIGH (ref 70–99)

## 2018-10-01 LAB — CULTURE, BLOOD (ROUTINE X 2): Special Requests: ADEQUATE

## 2018-10-01 MED ORDER — INSULIN ASPART PROT & ASPART (70-30 MIX) 100 UNIT/ML ~~LOC~~ SUSP
50.0000 [IU] | Freq: Two times a day (BID) | SUBCUTANEOUS | Status: DC
  Administered 2018-10-01 – 2018-10-02 (×3): 50 [IU] via SUBCUTANEOUS
  Filled 2018-10-01: qty 10

## 2018-10-01 MED ORDER — SALINE SPRAY 0.65 % NA SOLN
1.0000 | NASAL | Status: DC | PRN
  Administered 2018-10-01: 2 via NASAL
  Filled 2018-10-01: qty 44

## 2018-10-01 MED ORDER — KETOROLAC TROMETHAMINE 30 MG/ML IJ SOLN
30.0000 mg | Freq: Once | INTRAMUSCULAR | Status: AC
  Administered 2018-10-01: 30 mg via INTRAVENOUS
  Filled 2018-10-01: qty 1

## 2018-10-01 NOTE — Progress Notes (Signed)
PROGRESS NOTE    Katherine Herrera  AST:[id number] DOB: 03-13-1966 DOA: 09/28/2018 PCP: Cassandria Anger, MD    Brief Narrative:  52 year old female with a history of insulin-dependent diabetes, hypertension, hypothyroidism, chronic pain syndrome, diabetic neuropathy in her feet, recent resection of her distal fifth metatarsal on the left, presented to the emergency room with fevers, chills and ankle pain.  She was found to have swelling and redness in her left lower extremity consistent with a cellulitis.  She was admitted for further treatments.  She also complains of numbness/tingling in her hands bilaterally with occasional jerking for the past few days.  Assessment & Plan:   Principal Problem:   Sepsis due to cellulitis [facility]) Active Problems:   Hypothyroidism   Anxiety state   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)   Ulcer of foot due to diabetes mellitus (HCC)   Mild renal insufficiency   Hyponatremia   Chronic pain   Cellulitis of left lower leg   Diabetic foot ulcer ([place])  1. Sepsis.  Secondary to left leg cellulitis.  Blood cultures show positive for Streptococcus.  Continue on ceftriaxone.  Highly sensitive to ceftriaxone per I&D and sensitivities.   Hemodynamics are stable. 2. Cellulitis of left lower leg.  Does not appear to be progressing beyond marked borders.  Continue on intravenous antibiotics, keep elevated.  ABIs done bilaterally are greater than 1.  X-ray of left tibia/fibula does not show any deep infection.    Cellulitis did not appear to extend down to her foot.  She does have a callus on the plantar aspect of her foot, but this does not appear to be actively infected.  Only chronic changes noted in left foot. 3. Right hand paresthesias/numbness - Pt has history of bilateral carpal tunnel syndrome s/p surgical correction.  Offered MRI but patient says that she could not tolerate MRI today even with sedation.  Will ask neurology to see her tomorrow.    I question if this is diabetic neuropathy.    4. Generalized Anxiety Disorder.  Continue on clonazepam and Lamictal. 5. Chronic pain syndrome.  Continue on oxycodone, Neurontin and baclofen. 6. Hypothyroidism.  Continue Synthroid 7. Uncontrolled type 2 insulin requiring Diabetes Mellitus - she has had some low BS today.  Discontinue Levemir and change 70/30 insulin to 50 units twice daily with meals.  Change CBGs to 5x per day.  Monitor for hypoglycemia.    DVT prophylaxis: Heparin Code Status: Full code Family Communication: No family present Disposition Plan: Discharge home once improved  Consultants:     Procedures:     Antimicrobials:   Cefepime 11/29 > 12/1  Flagyl 11/29 > 12/1  Ceftriaxone 12/1 >   Subjective: Pt says that pain and swelling in left leg seem to be getting better.  C/o 2 weeks of numbness and paresthesias in her right hand and arm.   Objective: Vitals:   09/30/18 2104 10/01/18 0646 10/01/18 0837 10/01/18 1449  BP: (!) 98/58 115/67 101/65 101/61  Pulse: 83 87 72 77  Resp: 19   14  Temp: 99 F (37.2 C) 98.5 F (36.9 C)  98.4 F (36.9 C)  TempSrc: Oral Oral  Oral  SpO2: 95% 96%  100%  Weight:      Height:        Intake/Output Summary (Last 24 hours) at 10/01/2018 1619 Last data filed at 10/01/2018 1300 Gross per 24 hour  Intake 940 ml  Output 300 ml  Net 640 ml  Filed Weights   09/28/18 2028  Weight: 117.9 kg   Examination:  General exam: Appears calm and comfortable.   Respiratory system: Clear to auscultation. Respiratory effort normal. Cardiovascular system: S1 & S2 heard, RRR. No JVD, murmurs, rubs, gallops or clicks. No pedal edema. Gastrointestinal system: Abdomen is nondistended, soft and nontender. No organomegaly or masses felt. Normal bowel sounds heard. Central nervous system: Alert and oriented. No focal neurological deficits. Extremities: grip strength is decreased bilaterally, strength is 5/5 in upper arms and shoulder  shrugs bilaterally. Pulses intact bilaterally Skin: bright erythema noted over left lower leg and calf but not surpassing the marked lines and there is wrinkling of skin suggesting improvement in cellulitis infection, no drainage and no blisters seen.  There is a callus noted on the plantar surface of left foot at the base of her fifth digit.  No surrounding erythema/drainage/swelling noted in her foot. Psychiatry: anxiety noted.    Data Reviewed: I have personally reviewed following labs and imaging studies  CBC: Recent Labs  Lab 09/28/18 2112 09/29/18 0656 10/01/18 0503  WBC 19.3* 14.4* 10.3  NEUTROABS 17.1* 12.3*  --   HGB 14.2 12.2 11.4*  HCT 45.3 39.4 37.2  MCV 88.0 90.6 91.0  PLT 342 240 382   Basic Metabolic Panel: Recent Labs  Lab 09/28/18 2112 09/29/18 0656 09/30/18 1144 10/01/18 0503  NA 131* 135  --  135  K 4.6 4.1  --  4.5  CL 93* 102  --  101  CO2 27 25  --  27  GLUCOSE 231* 211*  --  184*  BUN 12 13  --  24*  CREATININE 1.10* 0.94  --  1.06*  CALCIUM 9.1 7.9*  --  8.4*  MG  --   --  2.1  --   PHOS  --   --  3.1  --    GFR: Estimated Creatinine Clearance: 85.2 mL/min (A) (by C-G formula based on SCr of 1.06 mg/dL (H)). Liver Function Tests: Recent Labs  Lab 09/28/18 2112  AST 16  ALT 15  ALKPHOS 106  BILITOT 0.5  PROT 9.2*  ALBUMIN 4.3   No results for input(s): LIPASE, AMYLASE in the last 168 hours. Recent Labs  Lab 09/30/18 1144  AMMONIA 18   Coagulation Profile: No results for input(s): INR, PROTIME in the last 168 hours. Cardiac Enzymes: No results for input(s): CKTOTAL, CKMB, CKMBINDEX, TROPONINI in the last 168 hours. BNP (last 3 results) No results for input(s): PROBNP in the last 8760 hours. HbA1C: Recent Labs    09/29/18 0656  HGBA1C 8.5*   CBG: Recent Labs  Lab 09/30/18 1631 09/30/18 2101 10/01/18 0753 10/01/18 1146 10/01/18 1239  GLUCAP 173* 274* 170* 61* 136*   Lipid Profile: No results for input(s): CHOL, HDL,  LDLCALC, TRIG, CHOLHDL, LDLDIRECT in the last 72 hours. Thyroid Function Tests: Recent Labs    09/30/18 1144  TSH 1.151   Anemia Panel: Recent Labs    09/30/18 1144  VITAMINB12 1,060*   Sepsis Labs: Recent Labs  Lab 09/28/18 2112 09/28/18 2348  LATICACIDVEN 2.2* 2.5*    Recent Results (from the past 240 hour(s))  Blood Culture (routine x 2)     Status: Abnormal   Collection Time: 09/28/18  9:12 PM  Result Value Ref Range Status   Specimen Description   Final    BLOOD RIGHT ANTECUBITAL Performed at [facility], [place] [address], [place], [place] [address]    Special Requests  Final    BOTTLES DRAWN AEROBIC AND ANAEROBIC Blood Culture adequate volume Performed at [facility], [address] [place], [place] [address]    Culture  Setup Time   Final    GRAM POSITIVE COCCI Gram Stain Report Called to,Read Back By and Verified With: FOLEY B. @ 3500 ON [id number] BY HENDERSON L. GRAM STAIN REVIEWED-AGREE WITH RESULT CRITICAL RESULT CALLED TO, READ BACK BY AND VERIFIED WITH: RN Marye Round FOLEY [id number] FCP Performed at [facility], [place] [address], [place], [place] [address]    Culture STREPTOCOCCUS GROUP C (A)  Final   Report Status 10/01/2018 FINAL  Final   Organism ID, Bacteria STREPTOCOCCUS GROUP C  Final      Susceptibility   Streptococcus group c - MIC*    CLINDAMYCIN <=0.25 SENSITIVE Sensitive     AMPICILLIN <=0.25 SENSITIVE Sensitive     ERYTHROMYCIN <=0.12 SENSITIVE Sensitive     VANCOMYCIN 0.5 SENSITIVE Sensitive     CEFTRIAXONE <=0.12 SENSITIVE Sensitive     LEVOFLOXACIN 0.5 SENSITIVE Sensitive     PENICILLIN Value in next row Sensitive      SENSITIVE<=0.06    * STREPTOCOCCUS GROUP C  Blood Culture ID Panel (Reflexed)     Status: Abnormal   Collection Time: 09/28/18  9:12 PM  Result Value Ref Range Status   Enterococcus species NOT DETECTED NOT DETECTED Final   Listeria monocytogenes NOT DETECTED NOT DETECTED Final   Staphylococcus species  NOT DETECTED NOT DETECTED Final   Staphylococcus aureus (BCID) NOT DETECTED NOT DETECTED Final   Streptococcus species DETECTED (A) NOT DETECTED Final    Comment: Not Enterococcus species, Streptococcus agalactiae, Streptococcus pyogenes, or Streptococcus pneumoniae. CRITICAL RESULT CALLED TO, READ BACK BY AND VERIFIED WITH: RN [id number] [place] [id number] [id number] FCP    Streptococcus agalactiae NOT DETECTED NOT DETECTED Final   Streptococcus pneumoniae NOT DETECTED NOT DETECTED Final   Streptococcus pyogenes NOT DETECTED NOT DETECTED Final   Acinetobacter baumannii NOT DETECTED NOT DETECTED Final   Enterobacteriaceae species NOT DETECTED NOT DETECTED Final   Enterobacter cloacae complex NOT DETECTED NOT DETECTED Final   Escherichia coli NOT DETECTED NOT DETECTED Final   Klebsiella oxytoca NOT DETECTED NOT DETECTED Final   Klebsiella pneumoniae NOT DETECTED NOT DETECTED Final   Proteus species NOT DETECTED NOT DETECTED Final   Serratia marcescens NOT DETECTED NOT DETECTED Final   Haemophilus influenzae NOT DETECTED NOT DETECTED Final   Neisseria meningitidis NOT DETECTED NOT DETECTED Final   Pseudomonas aeruginosa NOT DETECTED NOT DETECTED Final   Candida albicans NOT DETECTED NOT DETECTED Final   Candida glabrata NOT DETECTED NOT DETECTED Final   Candida krusei NOT DETECTED NOT DETECTED Final   Candida parapsilosis NOT DETECTED NOT DETECTED Final   Candida tropicalis NOT DETECTED NOT DETECTED Final    Comment: Performed at [facility], [place] [address] [place], [place] [address]  Blood Culture (routine x 2)     Status: None (Preliminary result)   Collection Time: 09/28/18  9:40 PM  Result Value Ref Range Status   Specimen Description BLOOD LEFT FOREARM  Final   Special Requests   Final    BOTTLES DRAWN AEROBIC AND ANAEROBIC Blood Culture adequate volume   Culture   Final    NO GROWTH 3 DAYS Performed at [facility], [address] [place],  [address]    Report Status  PENDING  Incomplete  Urine culture     Status: None   Collection Time:  09/28/18 11:18 PM  Result Value Ref Range Status   Specimen Description   Final    URINE, CLEAN CATCH Performed at [facility], [address], [place], [place] [address]    Special Requests   Final    NONE Performed at [facility], [address] [place], [place] [address]    Culture   Final    NO GROWTH Performed at [facility], [place] [address], [place],  [address]    Report Status 09/30/2018 FINAL  Final    Radiology Studies: US Venous Img Upper Uni Right  Result Date: 10/01/2018 CLINICAL DATA:  Right arm swelling EXAM: RIGHT UPPER EXTREMITY VENOUS DUPLEX ULTRASOUND TECHNIQUE: Doppler venous assessment of the right upper extremity deep venous system was performed, including characterization of spectral flow, compressibility, and phasicity. COMPARISON:  None. FINDINGS: There is complete compressibility of the right jugular, subclavian, axillary, brachial, radial, and ulnar veins. Doppler analysis demonstrates phasicity. There is no evidence of superficial vein thrombosis in the right upper extremity. IMPRESSION: No evidence of DVT in the right upper extremity or right internal jugular vein. Electronically Signed   By: Marybelle Killings M.D.   On: 10/01/2018 10:26   Scheduled Meds: . baclofen  20 mg Oral BID  . docusate sodium  200 mg Oral QPM  . ezetimibe  10 mg Oral Daily  . feeding supplement (PRO-STAT SUGAR FREE 64)  30 mL Oral TID BM  . gabapentin  800 mg Oral QID  . heparin  5,000 Units Subcutaneous Q8H  . insulin aspart  0-9 Units Subcutaneous QAC lunch  . insulin aspart protamine- aspart  50 Units Subcutaneous BID WC  . lamoTRIgine  25 mg Oral BID  . levothyroxine  75 mcg Oral Q0600  . multivitamin with minerals  1 tablet Oral Daily  . nutrition supplement (JUVEN)  1 packet Oral BID BM  . rOPINIRole  0.5 mg Oral QHS  . sodium chloride flush  3 mL Intravenous Q12H  . vitamin B-12  1,000 mcg  Oral Daily   Continuous Infusions: . cefTRIAXone (ROCEPHIN)  IV Stopped (09/30/18 1822)     LOS: 3 days    Time spent: 25 mins  Irwin Brakeman, MD [facility] Pager [phone]  If 7PM-7AM, please contact night-coverage [email or web address] Password [facility] 10/01/2018, 4:19 PM

## 2018-10-01 NOTE — Progress Notes (Signed)
Stage 1 CKD

## 2018-10-01 NOTE — Consult Note (Signed)
WOC consulted for lower extremity wound.  Discussed with bedside nurse. This morning patient would not initially allow bedside nurse to look at the leg or foot. Requested bedside nurse to contact [place] nurse if patient would allow her to assess the area.  Patient with previous surgical wound but currently there is no open wound, no drainage. Calloused/scabbed area per bedside nurse.  No need for topical wound care at this time.    Re consult if needed, will not follow at this time. Thanks  Michell Kader [employer], RN,CWOCN, CNS, [place] [phone])

## 2018-10-01 NOTE — Progress Notes (Signed)
Inpatient Diabetes Program Recommendations  AACE/ADA: New Consensus Statement on Inpatient Glycemic Control (2015)  Target Ranges:  Prepandial:   less than 140 mg/dL      Peak postprandial:   less than 180 mg/dL (1-2 hours)      Critically ill patients:  140 - 180 mg/dL   Results for BRIYANNA, BILLINGHAM (MRN [id number]) as of 10/01/2018 07:32  Ref. Range 09/30/2018 07:26 09/30/2018 11:44 09/30/2018 16:31 09/30/2018 21:01  Glucose-Capillary Latest Ref Range: 70 - 99 mg/dL 182 (H)  60 units 70/30 Insulin given at 10am 256 (H)  5 units NOVOLOG  173 (H) 274 (H)  35 units LEVEMIR   Results for MAILA, DUKES (MRN [id number]) as of 10/01/2018 07:32  Ref. Range 11/13/2017 14:39 09/29/2018 06:56  Hemoglobin A1C Latest Ref Range: 4.8 - 5.6 % 10.3 8.5 (H)     Home DM Meds: 70/30 Insulin- 60 units BID  Current Orders: 70/30 Insulin- 60 units QAM      Levemir 35 units QHS      Novolog Sensitive Correction Scale/ SSI (0-9 units) at Lunch Only (12pm)      Patient was seeing Dr. Loanne Drilling (Endocrinologist) for DM control and last office visited noted in chart was on 11/13/17.  Last A1C at that time was 10.3%.  Pt was instructed at that visit with Dr. Loanne Drilling to take 70/30 Insulin 60 units AM and 35 units in the PM.    MD- Note patient currently receiving Levemir and 70/30 Insulin.  Novolog Correction scale only being given at 12pm.  May consider stopping Levemir and Changing 70/30 Insulin to BID dosing since patient takes the 70/30 Insulin BID at home.  Please also consider increasing the frequency of the Novolog Correction scale to TID [facility] (currently only ordered to be given at 12pm)      --Will follow patient during hospitalization--  Wyn Quaker RN, MSN, CDE Diabetes Coordinator Inpatient Glycemic Control Team Team Pager: [phone] (8a-5p)

## 2018-10-02 ENCOUNTER — Other Ambulatory Visit: Payer: Self-pay | Admitting: Internal Medicine

## 2018-10-02 LAB — CBC WITH DIFFERENTIAL/PLATELET
Abs Immature Granulocytes: 0.03 10*3/uL (ref 0.00–0.07)
Basophils Absolute: 0 10*3/uL (ref 0.0–0.1)
Basophils Relative: 1 %
Eosinophils Absolute: 0.3 10*3/uL (ref 0.0–0.5)
Eosinophils Relative: 4 %
HCT: 39.7 % (ref 36.0–46.0)
Hemoglobin: 11.9 g/dL — ABNORMAL LOW (ref 12.0–15.0)
Immature Granulocytes: 0 %
Lymphocytes Relative: 34 %
Lymphs Abs: 2.9 10*3/uL (ref 0.7–4.0)
MCH: 27.5 pg (ref 26.0–34.0)
MCHC: 30 g/dL (ref 30.0–36.0)
MCV: 91.7 fL (ref 80.0–100.0)
Monocytes Absolute: 0.7 10*3/uL (ref 0.1–1.0)
Monocytes Relative: 8 %
Neutro Abs: 4.6 10*3/uL (ref 1.7–7.7)
Neutrophils Relative %: 53 %
Platelets: 267 10*3/uL (ref 150–400)
RBC: 4.33 MIL/uL (ref 3.87–5.11)
RDW: 12.4 % (ref 11.5–15.5)
WBC: 8.6 10*3/uL (ref 4.0–10.5)
nRBC: 0 % (ref 0.0–0.2)

## 2018-10-02 LAB — COMPREHENSIVE METABOLIC PANEL
ALT: 11 U/L (ref 0–44)
AST: 15 U/L (ref 15–41)
Albumin: 3.1 g/dL — ABNORMAL LOW (ref 3.5–5.0)
Alkaline Phosphatase: 77 U/L (ref 38–126)
Anion gap: 8 (ref 5–15)
BUN: 29 mg/dL — ABNORMAL HIGH (ref 6–20)
CO2: 27 mmol/L (ref 22–32)
Calcium: 8.3 mg/dL — ABNORMAL LOW (ref 8.9–10.3)
Chloride: 101 mmol/L (ref 98–111)
Creatinine, Ser: 1.09 mg/dL — ABNORMAL HIGH (ref 0.44–1.00)
GFR calc Af Amer: >60 mL/min (ref >60)
GFR calc non Af Amer: 58 mL/min — ABNORMAL LOW (ref >60)
Glucose, Bld: 141 mg/dL — ABNORMAL HIGH (ref 70–99)
Potassium: 4.7 mmol/L (ref 3.5–5.1)
Sodium: 136 mmol/L (ref 135–145)
Total Bilirubin: 0.4 mg/dL (ref 0.3–1.2)
Total Protein: 7.3 g/dL (ref 6.5–8.1)

## 2018-10-02 LAB — GLUCOSE, CAPILLARY
Glucose-Capillary: 143 mg/dL — ABNORMAL HIGH (ref 70–99)
Glucose-Capillary: 127 mg/dL — ABNORMAL HIGH (ref 70–99)
Glucose-Capillary: 137 mg/dL — ABNORMAL HIGH (ref 70–99)
Glucose-Capillary: 250 mg/dL — ABNORMAL HIGH (ref 70–99)
Glucose-Capillary: 151 mg/dL — ABNORMAL HIGH (ref 70–99)
Glucose-Capillary: 236 mg/dL — ABNORMAL HIGH (ref 70–99)

## 2018-10-02 LAB — MAGNESIUM: Magnesium: 2.5 mg/dL — ABNORMAL HIGH (ref 1.7–2.4)

## 2018-10-02 MED ORDER — BISACODYL 5 MG PO TBEC
10.0000 mg | DELAYED_RELEASE_TABLET | Freq: Once | ORAL | Status: AC
  Administered 2018-10-02: 10 mg via ORAL
  Filled 2018-10-02: qty 2

## 2018-10-02 NOTE — Progress Notes (Signed)
RN paged Katherine Blinks, NP to make her aware patient hasn't had a BM since 09/28/18 and is taking narcotic pain medication.  RN requested something to help with constipation, awaiting response.  P.J. Linus Mako, RN

## 2018-10-02 NOTE — Progress Notes (Deleted)
No chief complaint on file.   History of Present Illness: 52 yo female with history of COPD, DM, hyperlipidemia, hypothyroidism and peripheral neuropathy here today as a new consult, referred by Dr.   Darl Householder Care Physician: Plotnikov, [name], MD   Past Medical History:  Diagnosis Date  . Anxiety   . COPD (chronic obstructive pulmonary disease) ([place])   . DM (diabetes mellitus) ([place])   . Gout    possible  . Hyperlipidemia   . Hypothyroidism   . LBP (low back pain)    DR Patrice Paradise  . Migraine   . Peripheral neuropathy   . Syncope    poss conversion disorder vs psychogenic seizures  . Tachycardia    diet coke overuse?  Marland Kitchen Tenosynovitis of foot and ankle 09/03/2013  . TTS (tarsal tunnel syndrome) 2010   Left Foot [place] in [place]  . Vitamin D deficiency     Past Surgical History:  Procedure Laterality Date  . ABDOMINAL HYSTERECTOMY    . CARPAL TUNNEL RELEASE  2010   LEFT  . CERVICAL FUSION     C4-5    No current facility-administered medications for this visit.    No current outpatient medications on file.   Facility-Administered Medications Ordered in Other Visits  Medication Dose Route Frequency Provider Last Rate Last Dose  . acetaminophen (TYLENOL) tablet 650 mg  650 mg Oral Q6H PRN Opyd, Ilene Qua, MD       Or  . acetaminophen (TYLENOL) suppository 650 mg  650 mg Rectal Q6H PRN Opyd, Ilene Qua, MD      . baclofen (LIORESAL) tablet 20 mg  20 mg Oral BID Opyd, Ilene Qua, MD   20 mg at 10/02/18 1034  . cefTRIAXone (ROCEPHIN) 2 g in sodium chloride 0.9 % 100 mL IVPB  2 g Intravenous Q24H Kathie Dike, MD 200 mL/hr at 10/01/18 1756 2 g at 10/01/18 1756  . clonazePAM (KLONOPIN) tablet 0.5 mg  0.5 mg Oral BID PRN Vianne Bulls, MD   0.5 mg at 10/01/18 0552  . docusate sodium (COLACE) capsule 200 mg  200 mg Oral QPM Opyd, Ilene Qua, MD   200 mg at 10/01/18 1753  . ezetimibe (ZETIA) tablet 10 mg  10 mg Oral Daily Opyd, Ilene Qua, MD   10 mg at 10/02/18 1034  . feeding  supplement (PRO-STAT SUGAR FREE 64) liquid 30 mL  30 mL Oral TID BM Rizwan, Saima, MD   30 mL at 10/02/18 1034  . gabapentin (NEURONTIN) capsule 800 mg  800 mg Oral QID Opyd, Ilene Qua, MD   800 mg at 10/02/18 1033  . heparin injection 5,000 Units  5,000 Units Subcutaneous Q8H Opyd, Ilene Qua, MD   5,000 Units at 10/02/18 0538  . insulin aspart (novoLOG) injection 0-9 Units  0-9 Units Subcutaneous QAC lunch Debbe Odea, MD   3 Units at 10/02/18 1253  . insulin aspart protamine- aspart (NOVOLOG MIX 70/30) injection 50 Units  50 Units Subcutaneous BID WC Johnson, Clanford L, MD   50 Units at 10/02/18 0820  . lamoTRIgine (LAMICTAL) tablet 25 mg  25 mg Oral BID Opyd, Ilene Qua, MD   25 mg at 10/02/18 1000  . levothyroxine (SYNTHROID, LEVOTHROID) tablet 75 mcg  75 mcg Oral Q0600 Vianne Bulls, MD   75 mcg at 10/02/18 0538  . multivitamin with minerals tablet 1 tablet  1 tablet Oral Daily Debbe Odea, MD   1 tablet at 10/02/18 1033  . nutrition supplement (JUVEN) (JUVEN)  powder packet 1 packet  1 packet Oral BID BM Debbe Odea, MD   1 packet at 10/02/18 1000  . ondansetron (ZOFRAN) tablet 4 mg  4 mg Oral Q6H PRN Opyd, Ilene Qua, MD       Or  . ondansetron (ZOFRAN) injection 4 mg  4 mg Intravenous Q6H PRN Opyd, Ilene Qua, MD      . oxyCODONE (Oxy IR/ROXICODONE) immediate release tablet 30 mg  30 mg Oral Q6H PRN Kathie Dike, MD   30 mg at 10/02/18 0537  . rOPINIRole (REQUIP) tablet 0.5 mg  0.5 mg Oral QHS Opyd, Ilene Qua, MD   0.5 mg at 10/01/18 2247  . sodium chloride (OCEAN) 0.65 % nasal spray 1-2 spray  1-2 spray Each Nare PRN Opyd, Ilene Qua, MD   2 spray at 10/01/18 0553  . sodium chloride flush (NS) 0.9 % injection 3 mL  3 mL Intravenous Q12H Opyd, Ilene Qua, MD   3 mL at 10/01/18 2247  . vitamin B-12 (CYANOCOBALAMIN) tablet 1,000 mcg  1,000 mcg Oral Daily Opyd, Ilene Qua, MD   1,000 mcg at 10/02/18 1034    Allergies  Allergen Reactions  . Hydromorphone Hcl Itching  . Cephalexin  Nausea And Vomiting  . Demerol  [Meperidine Hcl] Rash  . Lovastatin Other (See Comments)    Other reaction(s): Unknown (See Comments) Possible myalgia Unknown   . Metformin And Related Nausea And Vomiting  . Sulfamethoxazole-Trimethoprim     Other reaction(s): Unknown DILI, pancreatitis  . Crestor [Rosuvastatin Calcium]     myalgia  . Niacin Other (See Comments)    Unknown   . Paroxetine Other (See Comments)    Other reaction(s): Unknown (See Comments) unknown Unknown     Social History   Socioeconomic History  . Marital status: Married    Spouse name: Not on file  . Number of children: Not on file  . Years of education: Not on file  . Highest education level: Not on file  Occupational History  . Not on file  Social Needs  . Financial resource strain: Patient refused  . Food insecurity:    Worry: Patient refused    Inability: Patient refused  . Transportation needs:    Medical: Patient refused    Non-medical: Patient refused  Tobacco Use  . Smoking status: Current Every Day Smoker    Packs/day: 1.00    Last attempt to quit: 12/01/2009    Years since quitting: 8.8  . Smokeless tobacco: Never Used  Substance and Sexual Activity  . Alcohol use: No    Alcohol/week: 0.0 standard drinks  . Drug use: No  . Sexual activity: Yes  Lifestyle  . Physical activity:    Days per week: Patient refused    Minutes per session: Patient refused  . Stress: Patient refused  Relationships  . Social connections:    Talks on phone: Patient refused    Gets together: Patient refused    Attends religious service: Patient refused    Active member of club or organization: Patient refused    Attends meetings of clubs or organizations: Patient refused    Relationship status: Patient refused  . Intimate partner violence:    Fear of current or ex partner: Patient refused    Emotionally abused: Patient refused    Physically abused: Patient refused    Forced sexual activity: Patient  refused  Other Topics Concern  . Not on file  Social History Narrative   GYN - Dr Radene Knee  FAMILY HISTORY   Lung cancer   Mother w/alcoholism      Married 3d GS due Nov   Diet Coke: 10 cans a day      Regular exercise - NO      Former smoker 2009 restared 2010 stopped 12/2009    Family History  Problem Relation Age of Onset  . Diabetes Mother   . Hypertension Mother   . Cancer Father 37       ? bone cancer  . [name] Brother     Review of Systems:  As stated in the HPI and otherwise negative.   There were no vitals taken for this visit.  Physical Examination: General: Well developed, well nourished, NAD  HEENT: OP clear, mucus membranes moist  SKIN: warm, dry. No rashes. Neuro: No focal deficits  Musculoskeletal: Muscle strength 5/5 all ext  Psychiatric: Mood and affect normal  Neck: No JVD, no carotid bruits, no thyromegaly, no lymphadenopathy.  Lungs:Clear bilaterally, no wheezes, rhonci, crackles Cardiovascular: Regular rate and rhythm. No murmurs, gallops or rubs. Abdomen:Soft. Bowel sounds present. Non-tender.  Extremities: No lower extremity edema. Pulses are 2 + in the bilateral DP/PT.  EKG:  EKG {ACTION; IS/IS VXB:93903009} ordered today. The ekg ordered today demonstrates ***  Recent Labs: 09/28/2018: B Natriuretic Peptide 58.0 09/30/2018: TSH 1.151 10/02/2018: ALT 11; BUN 29; Creatinine, Ser 1.09; Hemoglobin 11.9; Magnesium 2.5; Platelets 267; Potassium 4.7; Sodium 136   Lipid Panel    Component Value Date/Time   CHOL 223 (H) 07/19/2016 1519   TRIG (H) 07/19/2016 1519    527.0 Triglyceride is over 400; calculations on Lipids are invalid.   TRIG 400 (HH) 08/28/2006 0948   HDL 27.40 (L) 07/19/2016 1519   CHOLHDL 8 07/19/2016 1519   VLDL 92.0 (H) 07/08/2014 1648   LDLCALC 81 01/02/2013 0942   LDLDIRECT 106.0 07/19/2016 1519     Wt Readings from Last 3 Encounters:  09/28/18 260 lb (117.9 kg)  07/30/18 225 lb (102.1 kg)  04/27/18 221  lb (100.2 kg)     Other studies Reviewed: Additional studies/ records that were reviewed today include: ***. Review of the above records demonstrates: ***   Assessment and Plan:   1.   Current medicines are reviewed at length with the patient today.  The patient {ACTIONS; HAS/DOES NOT HAVE:19233} concerns regarding medicines.  The following changes have been made:  {PLAN; NO CHANGE:13088:s}  Labs/ tests ordered today include: *** No orders of the defined types were placed in this encounter.    Disposition:   FU with *** in {gen number 2-33:007622} {TIME; UNITS DAY/WEEK/MONTH:19136}   Signed, Lauree Chandler, MD 10/02/2018 2:33 PM    [address], [place], [place]  [address] Phone: [phone]; Fax: [phone]

## 2018-10-02 NOTE — Progress Notes (Signed)
PROGRESS NOTE    Katherine Herrera  IWL:[id number] DOB: 1966/05/02 DOA: 09/28/2018 PCP: Cassandria Anger, MD    Brief Narrative:  52 year old female with a history of insulin-dependent diabetes, hypertension, hypothyroidism, chronic pain syndrome, diabetic neuropathy in her feet, recent resection of her distal fifth metatarsal on the left, presented to the emergency room with fevers, chills and ankle pain.  She was found to have swelling and redness in her left lower extremity consistent with a cellulitis.  She was admitted for further treatments.  She also complains of numbness/tingling in her hands bilaterally with occasional jerking for the past few days.  Assessment & Plan:   Principal Problem:   Sepsis due to cellulitis [facility]) Active Problems:   Hypothyroidism   Anxiety state   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)   Ulcer of foot due to diabetes mellitus (HCC)   Mild renal insufficiency   Hyponatremia   Chronic pain   Cellulitis of left lower leg   Diabetic foot ulcer (HCC)   Polyneuropathy due to type 2 diabetes mellitus ([place])  1. Sepsis.  Resolved now.  Secondary to left leg cellulitis.  Blood cultures show positive for Streptococcus.  Continue on ceftriaxone as highly sensitive to ceftriaxone per I&D and sensitivities.   Hemodynamics are stable. 2. Cellulitis of left lower leg - Improving.   Continue on intravenous antibiotics, keep elevated.  ABIs done bilaterally are greater than 1.  X-ray of left tibia/fibula does not show any deep infection.    Cellulitis did not appear to extend down to her foot.  She does have a callus on the plantar aspect of her foot, but this does not appear to be actively infected.  Only chronic changes noted in left foot. 3. Right hand paresthesias/numbness - Pt has history of bilateral carpal tunnel syndrome s/p surgical correction.  Offered MRI but patient says that she could not tolerate MRI today even with sedation.  Will ask  neurology to see.   I suspect this is a manifestation of diabetic neuropathy.    4. Generalized Anxiety Disorder.  Continue on clonazepam and Lamictal. 5. Chronic pain syndrome.  Continue on oxycodone, Neurontin and baclofen. 6. Hypothyroidism.  Continue Synthroid 7. Uncontrolled type 2 insulin requiring Diabetes Mellitus - she has had some low BS today.  Discontinue Levemir and change 70/30 insulin to 50 units twice daily with meals.  CBGs to 5x per day.  Monitor for hypoglycemia.    DVT prophylaxis: Heparin Code Status: Full code Family Communication: No family present Disposition Plan: Discharge home once improved  Consultants:     Procedures:     Antimicrobials:   Cefepime 11/29 > 12/1  Flagyl 11/29 > 12/1  Ceftriaxone 12/1 >   Subjective: Pt says that she feels that leg is getting better.  She still has numbness, tingling in right hand fingers.    Objective: Vitals:   10/01/18 1943 10/01/18 2107 10/02/18 0506 10/02/18 1439  BP:  96/69 107/65 113/69  Pulse:  81 81 88  Resp:  17 16 18   Temp:  98.7 F (37.1 C) 98.3 F (36.8 C)   TempSrc:  Oral Oral   SpO2: 99% 95% 99% 96%  Weight:      Height:        Intake/Output Summary (Last 24 hours) at 10/02/2018 1548 Last data filed at 10/02/2018 0900 Gross per 24 hour  Intake 480 ml  Output -  Net 480 ml   Filed Weights   09/28/18 2028  Weight: 117.9 kg   Examination:  General exam: Appears calm and comfortable.   Respiratory system: Clear to auscultation. Respiratory effort normal. Cardiovascular system: S1 & S2 heard, RRR. No JVD, murmurs, rubs, gallops or clicks. No pedal edema. Gastrointestinal system: Abdomen is nondistended, soft and nontender. No organomegaly or masses felt. Normal bowel sounds heard. Central nervous system: Alert and oriented. No focal neurological deficits. Extremities: grip strength is decreased bilaterally, strength is 5/5 in upper arms and shoulder shrugs bilaterally. Pulses intact  bilaterally Skin: bright erythema noted over left lower leg and calf but not surpassing the marked lines and there is wrinkling of skin suggesting improvement in cellulitis infection, no drainage and no blisters seen.  There is a callus noted on the plantar surface of left foot at the base of her fifth digit.  No surrounding erythema/drainage/swelling noted in her foot.     Psychiatry: anxiety noted.    Data Reviewed: I have personally reviewed following labs and imaging studies  CBC: Recent Labs  Lab 09/28/18 2112 09/29/18 0656 10/01/18 0503 10/02/18 0415  WBC 19.3* 14.4* 10.3 8.6  NEUTROABS 17.1* 12.3*  --  4.6  HGB 14.2 12.2 11.4* 11.9*  HCT 45.3 39.4 37.2 39.7  MCV 88.0 90.6 91.0 91.7  PLT 342 240 232 300   Basic Metabolic Panel: Recent Labs  Lab 09/28/18 2112 09/29/18 0656 09/30/18 1144 10/01/18 0503 10/02/18 0415  NA 131* 135  --  135 136  K 4.6 4.1  --  4.5 4.7  CL 93* 102  --  101 101  CO2 27 25  --  27 27  GLUCOSE 231* 211*  --  184* 141*  BUN 12 13  --  24* 29*  CREATININE 1.10* 0.94  --  1.06* 1.09*  CALCIUM 9.1 7.9*  --  8.4* 8.3*  MG  --   --  2.1  --  2.5*  PHOS  --   --  3.1  --   --    GFR: Estimated Creatinine Clearance: 82.8 mL/min (A) (by C-G formula based on SCr of 1.09 mg/dL (H)). Liver Function Tests: Recent Labs  Lab 09/28/18 2112 10/02/18 0415  AST 16 15  ALT 15 11  ALKPHOS 106 77  BILITOT 0.5 0.4  PROT 9.2* 7.3  ALBUMIN 4.3 3.1*   No results for input(s): LIPASE, AMYLASE in the last 168 hours. Recent Labs  Lab 09/30/18 1144  AMMONIA 18   Coagulation Profile: No results for input(s): INR, PROTIME in the last 168 hours. Cardiac Enzymes: No results for input(s): CKTOTAL, CKMB, CKMBINDEX, TROPONINI in the last 168 hours. BNP (last 3 results) No results for input(s): PROBNP in the last 8760 hours. HbA1C: No results for input(s): HGBA1C in the last 72 hours. CBG: Recent Labs  Lab 10/01/18 2108 10/02/18 0329 10/02/18 0513  10/02/18 0742 10/02/18 1113  GLUCAP 173* 143* 127* 137* 250*   Lipid Profile: No results for input(s): CHOL, HDL, LDLCALC, TRIG, CHOLHDL, LDLDIRECT in the last 72 hours. Thyroid Function Tests: Recent Labs    09/30/18 1144  TSH 1.151   Anemia Panel: Recent Labs    09/30/18 1144  VITAMINB12 1,060*   Sepsis Labs: Recent Labs  Lab 09/28/18 2112 09/28/18 2348  LATICACIDVEN 2.2* 2.5*    Recent Results (from the past 240 hour(s))  Blood Culture (routine x 2)     Status: Abnormal   Collection Time: 09/28/18  9:12 PM  Result Value Ref Range Status   Specimen Description   Final  BLOOD RIGHT ANTECUBITAL Performed at [facility], [place] [address] [place], [place] [address]    Special Requests   Final    BOTTLES DRAWN AEROBIC AND ANAEROBIC Blood Culture adequate volume Performed at [facility], [address], [place], [place] [address]    Culture  Setup Time   Final    GRAM POSITIVE COCCI Gram Stain Report Called to,Read Back By and Verified With: FOLEY B. @ 2585 ON [id number] BY HENDERSON L. GRAM STAIN REVIEWED-AGREE WITH RESULT CRITICAL RESULT CALLED TO, READ BACK BY AND VERIFIED WITH: RN Marye Round FOLEY [id number] FCP Performed at [facility], [place] [address], [place], [place] [address]    Culture STREPTOCOCCUS GROUP C (A)  Final   Report Status 10/01/2018 FINAL  Final   Organism ID, Bacteria STREPTOCOCCUS GROUP C  Final      Susceptibility   Streptococcus group c - MIC*    CLINDAMYCIN <=0.25 SENSITIVE Sensitive     AMPICILLIN <=0.25 SENSITIVE Sensitive     ERYTHROMYCIN <=0.12 SENSITIVE Sensitive     VANCOMYCIN 0.5 SENSITIVE Sensitive     CEFTRIAXONE <=0.12 SENSITIVE Sensitive     LEVOFLOXACIN 0.5 SENSITIVE Sensitive     PENICILLIN Value in next row Sensitive      SENSITIVE<=0.06    * STREPTOCOCCUS GROUP C  Blood Culture ID Panel (Reflexed)     Status: Abnormal   Collection Time: 09/28/18  9:12 PM  Result Value Ref Range Status   Enterococcus  species NOT DETECTED NOT DETECTED Final   Listeria monocytogenes NOT DETECTED NOT DETECTED Final   Staphylococcus species NOT DETECTED NOT DETECTED Final   Staphylococcus aureus (BCID) NOT DETECTED NOT DETECTED Final   Streptococcus species DETECTED (A) NOT DETECTED Final    Comment: Not Enterococcus species, Streptococcus agalactiae, Streptococcus pyogenes, or Streptococcus pneumoniae. CRITICAL RESULT CALLED TO, READ BACK BY AND VERIFIED WITH: RN [id number] Christie [id number] FCP    Streptococcus agalactiae NOT DETECTED NOT DETECTED Final   Streptococcus pneumoniae NOT DETECTED NOT DETECTED Final   Streptococcus pyogenes NOT DETECTED NOT DETECTED Final   Acinetobacter baumannii NOT DETECTED NOT DETECTED Final   Enterobacteriaceae species NOT DETECTED NOT DETECTED Final   Enterobacter cloacae complex NOT DETECTED NOT DETECTED Final   Escherichia coli NOT DETECTED NOT DETECTED Final   Klebsiella oxytoca NOT DETECTED NOT DETECTED Final   Klebsiella pneumoniae NOT DETECTED NOT DETECTED Final   Proteus species NOT DETECTED NOT DETECTED Final   Serratia marcescens NOT DETECTED NOT DETECTED Final   Haemophilus influenzae NOT DETECTED NOT DETECTED Final   Neisseria meningitidis NOT DETECTED NOT DETECTED Final   Pseudomonas aeruginosa NOT DETECTED NOT DETECTED Final   Candida albicans NOT DETECTED NOT DETECTED Final   Candida glabrata NOT DETECTED NOT DETECTED Final   Candida krusei NOT DETECTED NOT DETECTED Final   Candida parapsilosis NOT DETECTED NOT DETECTED Final   Candida tropicalis NOT DETECTED NOT DETECTED Final    Comment: Performed at [facility], [place] [address], [place], [place] [address]  Blood Culture (routine x 2)     Status: None (Preliminary result)   Collection Time: 09/28/18  9:40 PM  Result Value Ref Range Status   Specimen Description BLOOD LEFT FOREARM  Final   Special Requests   Final    BOTTLES DRAWN AEROBIC AND ANAEROBIC Blood Culture adequate volume    Culture   Final    NO GROWTH 4 DAYS Performed at [facility], [address] [place],  [place] [address]    Report Status PENDING  Incomplete  Urine culture     Status: None   Collection Time: 09/28/18 11:18 PM  Result Value Ref Range Status   Specimen Description   Final    URINE, CLEAN CATCH Performed at [facility], [address] [place], [place] [address]    Special Requests   Final    NONE Performed at [facility], [address], [place], [place] [address]    Culture   Final    NO GROWTH Performed at [facility], [place] [address], [place], [place] [address]    Report Status 09/30/2018 FINAL  Final    Radiology Studies: US Venous Img Upper Uni Right  Result Date: 10/01/2018 CLINICAL DATA:  Right arm swelling EXAM: RIGHT UPPER EXTREMITY VENOUS DUPLEX ULTRASOUND TECHNIQUE: Doppler venous assessment of the right upper extremity deep venous system was performed, including characterization of spectral flow, compressibility, and phasicity. COMPARISON:  None. FINDINGS: There is complete compressibility of the right jugular, subclavian, axillary, brachial, radial, and ulnar veins. Doppler analysis demonstrates phasicity. There is no evidence of superficial vein thrombosis in the right upper extremity. IMPRESSION: No evidence of DVT in the right upper extremity or right internal jugular vein. Electronically Signed   By: Marybelle Killings M.D.   On: 10/01/2018 10:26   Scheduled Meds: . baclofen  20 mg Oral BID  . docusate sodium  200 mg Oral QPM  . ezetimibe  10 mg Oral Daily  . feeding supplement (PRO-STAT SUGAR FREE 64)  30 mL Oral TID BM  . gabapentin  800 mg Oral QID  . heparin  5,000 Units Subcutaneous Q8H  . insulin aspart  0-9 Units Subcutaneous QAC lunch  . insulin aspart protamine- aspart  50 Units Subcutaneous BID WC  . lamoTRIgine  25 mg Oral BID  . levothyroxine  75 mcg Oral Q0600  . multivitamin with minerals  1 tablet Oral Daily  . nutrition supplement (JUVEN)  1  packet Oral BID BM  . rOPINIRole  0.5 mg Oral QHS  . sodium chloride flush  3 mL Intravenous Q12H  . vitamin B-12  1,000 mcg Oral Daily   Continuous Infusions: . cefTRIAXone (ROCEPHIN)  IV 2 g (10/01/18 1756)     LOS: 4 days   Time spent: 23 mins  Irwin Brakeman, MD [facility] Pager [phone]  If 7PM-7AM, please contact night-coverage [email or web address] Password [facility] 10/02/2018, 3:48 PM

## 2018-10-02 NOTE — Consult Note (Signed)
[place] A. Merlene Laughter, MD     [email or web address]          Katherine Herrera is an 52 y.o. female.   ASSESSMENT/PLAN: 1.  Episodic myoclonic activity: I suspect that this is most likely due to acute medical illness particularly sepsis.  Medication effect may also be an issue.  Patient has had this before associated with syncope and altered mentation.  An EEG will therefore be obtained.  2.  Diabetic polyneuropathy  3.  Right carpal tunnel syndrome: Right hand brace is recommended.  Electrodiagnostic testing can be done when she has left the hospital.  This can be arranged in the office.    The patient is a 52 year old white female who presents with acute onset of jerky activity involving the extremities bilaterally especially on the right side.  She is right-handed.  She reports that the symptoms started when she developed redness and swelling of the left leg.  She has been admitted to the hospital for cellulitis due to sepsis.  She time that she had a fever 103.  She does not report having drowsiness or altered mentation during this event.  She reports however that about a month and a half ago she had episode of syncope.  She apparently blacked out for about 15 minutes.  She tells me she was admitted to the hospital in [facility] for this.  She also had another episode where she was confused and had jerky activity of the upper extremities like she has at this time.  In addition to the jerky activity, she reports having a lot of numbness, pain dysesthesia and swelling of the hands bilaterally.  The right hand is much more symptomatic.  Sometimes she reports having weakness of the right hand to the point that she drops things.  She has difficulty using the right hand.  The review of systems otherwise negative.    GENERAL: This a pleasant female who appears about 70 to 44 years older than the stated age.  She is in no acute distress but seems to be in some discomfort from the  hands bilaterally especially the right side.  HEENT:  This is norma.  ABDOMEN: soft  EXTREMITIES: There is significant erythema of the left leg up to about two thirds.  There is mild edema associated with this.  There is subtle swelling and arthritic changes of the hands bilaterally.  There is marked Tinel sign at the wrist on the right.  Tinel sign is negative at the left wrist.  BACK: Normal  SKIN: Normal by inspection.    MENTAL STATUS: Alert and oriented. Speech, language and cognition are generally intact. Judgment and insight normal.   CRANIAL NERVES: Pupils are equal, round and reactive to light and accomodation; extra ocular movements are full, there is no significant nystagmus; visual fields are full; upper and lower facial muscles are normal in strength and symmetric, there is no flattening of the nasolabial folds; tongue is midline; uvula is midline; shoulder elevation is normal.  MOTOR: There is mild weakness of the right hand mostly due to pain.  She has normal abductor pollicis brevis muscle strength bilaterally.  Otherwise, tone bulk and strength are normal throughout in the upper and lower extremities.    COORDINATION: Left finger to nose is normal, right finger to nose is normal; there is episodic jerky myoclonic movements involving the right upper extremity.  REFLEXES: Deep tendon reflexes are symmetrical and normal in the upper extremities but markedly diminished to trace  in the legs.    SENSATION: Diminished in the right hand.  GAIT: Normal.      Blood pressure 113/69, pulse 88, temperature 98.3 F (36.8 C), temperature source Oral, resp. rate 18, height 5\' 9"  (1.753 m), weight 117.9 kg, SpO2 96 %.  Past Medical History:  Diagnosis Date  . Anxiety   . COPD (chronic obstructive pulmonary disease) ([place])   . DM (diabetes mellitus) ([place])   . Gout    possible  . Hyperlipidemia   . Hypothyroidism   . LBP (low back pain)    DR Patrice Paradise  . Migraine   . Peripheral  neuropathy   . Syncope    poss conversion disorder vs psychogenic seizures  . Tachycardia    diet coke overuse?  Marland Kitchen Tenosynovitis of foot and ankle 09/03/2013  . TTS (tarsal tunnel syndrome) 2010   Left Foot [place] in [place]  . Vitamin D deficiency     Past Surgical History:  Procedure Laterality Date  . ABDOMINAL HYSTERECTOMY    . CARPAL TUNNEL RELEASE  2010   LEFT  . CERVICAL FUSION     C4-5    Family History  Problem Relation Age of Onset  . Diabetes Mother   . Hypertension Mother   . Cancer Father 74       ? bone cancer  . [name] Brother     Social History:  reports that she has been smoking. She has been smoking about 1.00 pack per day. She has never used smokeless tobacco. She reports that she does not drink alcohol or use drugs.  Allergies:  Allergies  Allergen Reactions  . Hydromorphone Hcl Itching  . Cephalexin Nausea And Vomiting  . Demerol  [Meperidine Hcl] Rash  . Lovastatin Other (See Comments)    Other reaction(s): Unknown (See Comments) Possible myalgia Unknown   . Metformin And Related Nausea And Vomiting  . Sulfamethoxazole-Trimethoprim     Other reaction(s): Unknown DILI, pancreatitis  . Crestor [Rosuvastatin Calcium]     myalgia  . Niacin Other (See Comments)    Unknown   . Paroxetine Other (See Comments)    Other reaction(s): Unknown (See Comments) unknown Unknown     Medications: Prior to Admission medications   Medication Sig Start Date End Date Taking? Authorizing Provider  aspirin (BAYER ASPIRIN) 325 MG tablet Take 1 tablet (325 mg total) by mouth daily. 06/16/15  Yes Plotnikov, [name], MD  baclofen (LIORESAL) 10 MG tablet TAKE 2 TABLETS BY MOUTH TWO TIMES DAILY Patient taking differently: Take 20 mg by mouth 2 (two) times daily.  10/06/17  Yes Plotnikov, [name], MD  Cholecalciferol (VITAMIN D3) 1000 UNIT tablet Take 1,000 Units by mouth daily.    Yes [provider]  clonazePAM (KLONOPIN) 0.5 MG tablet TAKE ONE  TABLET BY MOUTH 2 TIMES A DAY AS NEEDED FOR ANXIETY Patient taking differently: Take 0.5 mg by mouth 2 (two) times daily as needed for anxiety.  12/28/17  Yes Plotnikov, [name], MD  docusate sodium (COLACE) 100 MG capsule Take 200 mg by mouth every evening.   Yes [provider]  ezetimibe (ZETIA) 10 MG tablet TAKE ONE TABLET BY MOUTH EVERY DAY Patient taking differently: Take 10 mg by mouth daily.  06/13/18  Yes Plotnikov, [name], MD  gabapentin (NEURONTIN) 800 MG tablet TAKE 1 TABLET BY MOUTH FOUR TIMES DAILY Patient taking differently: Take 800 mg by mouth 4 (four) times daily.  04/18/18  Yes Renato Shin, MD  insulin NPH-regular  Human (NOVOLIN 70/30) (70-30) 100 UNIT/ML injection 60 units with breakfast, and 35 units with supper, and syringes Patient taking differently: Inject 60 Units into the skin 2 (two) times daily with a meal.  11/13/17  Yes Renato Shin, MD  lamoTRIgine (LAMICTAL) 25 MG tablet TAKE 1 TABLET BY MOUTH TWO  TIMES DAILY Patient taking differently: Take 25 mg by mouth 2 (two) times daily.  04/18/18  Yes Plotnikov, [name], MD  levothyroxine (SYNTHROID, LEVOTHROID) 75 MCG tablet Take 1 tablet (75 mcg total) by mouth daily. 04/27/18  Yes Plotnikov, [name], MD  lisinopril (PRINIVIL,ZESTRIL) 10 MG tablet TAKE ONE TABLET BY MOUTH EVERY DAY Patient taking differently: Take 10 mg by mouth daily.  09/17/18  Yes Plotnikov, [name], MD  loratadine (CLARITIN) 10 MG tablet Take 10 mg by mouth daily as needed for allergies.   Yes [provider]  oxyCODONE (ROXICODONE) 15 MG immediate release tablet Take 1 tablet (15 mg total) by mouth every 6 (six) hours as needed for pain. 08/29/18  Yes Plotnikov, [name], MD  rOPINIRole (REQUIP) 0.5 MG tablet TAKE ONE TABLET BY MOUTH AT BEDTIME Patient taking differently: Take 0.5 mg by mouth at bedtime.  02/21/18  Yes Plotnikov, [name], MD  vitamin B-12 (CYANOCOBALAMIN) 1000 MCG tablet Take 1,000 mcg by mouth daily.      Yes [provider]  B-D INS SYR ULTRAFINE 1CC/31G 31G X 5/16" 1 ML MISC USE AS DIRECTED 09/08/16   Renato Shin, MD  glucose blood (ONETOUCH VERIO) test strip Use to check blood sugar 2 times per day. 06/24/16   Renato Shin, MD  Insulin Syringe-Needle U-100 (SURE COMFORT INSULIN SYRINGE) 31G X 5/16" 0.5 ML MISC USE AS DIRECTED WITH INSULIN 4 TIMES A DAY 09/08/17   Renato Shin, MD  Lancet Devices (ACCU-CHEK [facility]) lancets Test two times daily 07/01/16   Renato Shin, MD  [facility] DELICA LANCETS [address] [place] TEST TWO TIMES DAILY 04/18/18   Renato Shin, MD    Scheduled Meds: . baclofen  20 mg Oral BID  . docusate sodium  200 mg Oral QPM  . ezetimibe  10 mg Oral Daily  . feeding supplement (PRO-STAT SUGAR FREE 64)  30 mL Oral TID BM  . gabapentin  800 mg Oral QID  . heparin  5,000 Units Subcutaneous Q8H  . insulin aspart  0-9 Units Subcutaneous QAC lunch  . insulin aspart protamine- aspart  50 Units Subcutaneous BID WC  . lamoTRIgine  25 mg Oral BID  . levothyroxine  75 mcg Oral Q0600  . multivitamin with minerals  1 tablet Oral Daily  . nutrition supplement (JUVEN)  1 packet Oral BID BM  . rOPINIRole  0.5 mg Oral QHS  . sodium chloride flush  3 mL Intravenous Q12H  . vitamin B-12  1,000 mcg Oral Daily   Continuous Infusions: . cefTRIAXone (ROCEPHIN)  IV 2 g (10/01/18 1756)   PRN Meds:.acetaminophen **OR** acetaminophen, clonazePAM, ondansetron **OR** ondansetron (ZOFRAN) IV, oxyCODONE, sodium chloride     Results for orders placed or performed during the hospital encounter of 09/28/18 (from the past 48 hour(s))  Glucose, capillary     Status: Abnormal   Collection Time: 09/30/18  4:31 PM  Result Value Ref Range   Glucose-Capillary 173 (H) 70 - 99 mg/dL   Comment 1 Notify RN    Comment 2 Document in Chart   Glucose, capillary     Status: Abnormal   Collection Time: 09/30/18  9:01 PM  Result Value Ref  Range   Glucose-Capillary 274 (H) 70 - 99 mg/dL   Comment 1 Notify  RN    Comment 2 Document in Chart   CBC     Status: Abnormal   Collection Time: 10/01/18  5:03 AM  Result Value Ref Range   WBC 10.3 4.0 - 10.5 K/uL   RBC 4.09 3.87 - 5.11 MIL/uL   Hemoglobin 11.4 (L) 12.0 - 15.0 g/dL   HCT 37.2 36.0 - 46.0 %   MCV 91.0 80.0 - 100.0 fL   MCH 27.9 26.0 - 34.0 pg   MCHC 30.6 30.0 - 36.0 g/dL   RDW 12.5 11.5 - 15.5 %   Platelets 232 150 - 400 K/uL   nRBC 0.0 0.0 - 0.2 %    Comment: Performed at [facility], [address], [place], [place] [address]  Basic metabolic panel     Status: Abnormal   Collection Time: 10/01/18  5:03 AM  Result Value Ref Range   Sodium 135 135 - 145 mmol/L   Potassium 4.5 3.5 - 5.1 mmol/L   Chloride 101 98 - 111 mmol/L   CO2 27 22 - 32 mmol/L   Glucose, Bld 184 (H) 70 - 99 mg/dL   BUN 24 (H) 6 - 20 mg/dL   Creatinine, Ser 1.06 (H) 0.44 - 1.00 mg/dL   Calcium 8.4 (L) 8.9 - 10.3 mg/dL   GFR calc non Af Amer >60 >60 mL/min   GFR calc Af Amer >60 >60 mL/min   Anion gap 7 5 - 15    Comment: Performed at [facility], [address] [place], [place] [address]  Glucose, capillary     Status: Abnormal   Collection Time: 10/01/18  7:53 AM  Result Value Ref Range   Glucose-Capillary 170 (H) 70 - 99 mg/dL  Glucose, capillary     Status: Abnormal   Collection Time: 10/01/18 11:46 AM  Result Value Ref Range   Glucose-Capillary 61 (L) 70 - 99 mg/dL   Comment 1 Notify RN   Glucose, capillary     Status: Abnormal   Collection Time: 10/01/18 12:39 PM  Result Value Ref Range   Glucose-Capillary 136 (H) 70 - 99 mg/dL  Glucose, capillary     Status: Abnormal   Collection Time: 10/01/18  4:32 PM  Result Value Ref Range   Glucose-Capillary 148 (H) 70 - 99 mg/dL   Comment 1 Notify RN    Comment 2 Document in Chart   Glucose, capillary     Status: Abnormal   Collection Time: 10/01/18  9:08 PM  Result Value Ref Range   Glucose-Capillary 173 (H) 70 - 99 mg/dL  Glucose, capillary     Status: Abnormal   Collection Time: 10/02/18   3:29 AM  Result Value Ref Range   Glucose-Capillary 143 (H) 70 - 99 mg/dL  CBC with Differential/Platelet     Status: Abnormal   Collection Time: 10/02/18  4:15 AM  Result Value Ref Range   WBC 8.6 4.0 - 10.5 K/uL   RBC 4.33 3.87 - 5.11 MIL/uL   Hemoglobin 11.9 (L) 12.0 - 15.0 g/dL   HCT 39.7 36.0 - 46.0 %   MCV 91.7 80.0 - 100.0 fL   MCH 27.5 26.0 - 34.0 pg   MCHC 30.0 30.0 - 36.0 g/dL   RDW 12.4 11.5 - 15.5 %   Platelets 267 150 - 400 K/uL   nRBC 0.0 0.0 - 0.2 %   Neutrophils Relative % 53 %   Neutro Abs  4.6 1.7 - 7.7 K/uL   Lymphocytes Relative 34 %   Lymphs Abs 2.9 0.7 - 4.0 K/uL   Monocytes Relative 8 %   Monocytes Absolute 0.7 0.1 - 1.0 K/uL   Eosinophils Relative 4 %   Eosinophils Absolute 0.3 0.0 - 0.5 K/uL   Basophils Relative 1 %   Basophils Absolute 0.0 0.0 - 0.1 K/uL   Immature Granulocytes 0 %   Abs Immature Granulocytes 0.03 0.00 - 0.07 K/uL    Comment: Performed at [facility], [address] [place], [place] [address]  Comprehensive metabolic panel     Status: Abnormal   Collection Time: 10/02/18  4:15 AM  Result Value Ref Range   Sodium 136 135 - 145 mmol/L   Potassium 4.7 3.5 - 5.1 mmol/L   Chloride 101 98 - 111 mmol/L   CO2 27 22 - 32 mmol/L   Glucose, Bld 141 (H) 70 - 99 mg/dL   BUN 29 (H) 6 - 20 mg/dL   Creatinine, Ser 1.09 (H) 0.44 - 1.00 mg/dL   Calcium 8.3 (L) 8.9 - 10.3 mg/dL   Total Protein 7.3 6.5 - 8.1 g/dL   Albumin 3.1 (L) 3.5 - 5.0 g/dL   AST 15 15 - 41 U/L   ALT 11 0 - 44 U/L   Alkaline Phosphatase 77 38 - 126 U/L   Total Bilirubin 0.4 0.3 - 1.2 mg/dL   GFR calc non Af Amer 58 (L) >60 mL/min   GFR calc Af Amer >60 >60 mL/min   Anion gap 8 5 - 15    Comment: Performed at [facility] [address], [place], [place] [address]  Magnesium     Status: Abnormal   Collection Time: 10/02/18  4:15 AM  Result Value Ref Range   Magnesium 2.5 (H) 1.7 - 2.4 mg/dL    Comment: Performed at [facility], [address], [place], [place] [address]   Glucose, capillary     Status: Abnormal   Collection Time: 10/02/18  5:13 AM  Result Value Ref Range   Glucose-Capillary 127 (H) 70 - 99 mg/dL   Comment 1 Notify RN   Glucose, capillary     Status: Abnormal   Collection Time: 10/02/18  7:42 AM  Result Value Ref Range   Glucose-Capillary 137 (H) 70 - 99 mg/dL   Comment 1 Notify RN    Comment 2 Document in Chart   Glucose, capillary     Status: Abnormal   Collection Time: 10/02/18 11:13 AM  Result Value Ref Range   Glucose-Capillary 250 (H) 70 - 99 mg/dL   Comment 1 Notify RN    Comment 2 Document in Chart     Studies/Results:     Aowyn Rozeboom A. Merlene Laughter, M.D.  Diplomate, Tax adviser of Psychiatry and Neurology ( Neurology). 10/02/2018, 2:45 PM

## 2018-10-03 ENCOUNTER — Ambulatory Visit: Payer: Medicare Other | Admitting: Cardiovascular Disease

## 2018-10-03 ENCOUNTER — Inpatient Hospital Stay (HOSPITAL_COMMUNITY)
Admit: 2018-10-03 | Discharge: 2018-10-03 | Disposition: A | Discharge status: Home / Self Care | Payer: Medicare Other | Attending: Neurology | Admitting: Neurology

## 2018-10-03 DIAGNOSIS — R569 Unspecified convulsions: Secondary | ICD-10-CM | POA: Diagnosis not present

## 2018-10-03 LAB — CULTURE, BLOOD (ROUTINE X 2)
Culture: NO GROWTH
Special Requests: ADEQUATE

## 2018-10-03 LAB — GLUCOSE, CAPILLARY
Glucose-Capillary: 233 mg/dL — ABNORMAL HIGH (ref 70–99)
Glucose-Capillary: 220 mg/dL — ABNORMAL HIGH (ref 70–99)
Glucose-Capillary: 96 mg/dL (ref 70–99)
Glucose-Capillary: 104 mg/dL — ABNORMAL HIGH (ref 70–99)
Glucose-Capillary: 159 mg/dL — ABNORMAL HIGH (ref 70–99)

## 2018-10-03 LAB — CREATININE, SERUM
Creatinine, Ser: 1.13 mg/dL — ABNORMAL HIGH (ref 0.44–1.00)
GFR calc Af Amer: >60 mL/min (ref >60)
GFR calc non Af Amer: 56 mL/min — ABNORMAL LOW (ref >60)

## 2018-10-03 MED ORDER — POLYETHYLENE GLYCOL 3350 17 G PO PACK
17.0000 g | PACK | Freq: Two times a day (BID) | ORAL | Status: DC
  Administered 2018-10-03 – 2018-10-05 (×5): 17 g via ORAL
  Filled 2018-10-03 (×5): qty 1

## 2018-10-03 MED ORDER — INSULIN ASPART PROT & ASPART (70-30 MIX) 100 UNIT/ML ~~LOC~~ SUSP
60.0000 [IU] | Freq: Two times a day (BID) | SUBCUTANEOUS | Status: DC
  Administered 2018-10-03 – 2018-10-05 (×5): 60 [IU] via SUBCUTANEOUS
  Filled 2018-10-03: qty 10

## 2018-10-03 MED ORDER — BISACODYL 10 MG RE SUPP: 10.0000 mg | Freq: Every day | RECTAL | Status: DC | PRN

## 2018-10-03 MED ORDER — INSULIN ASPART 100 UNIT/ML ~~LOC~~ SOLN
0.0000 [IU] | Freq: Three times a day (TID) | SUBCUTANEOUS | Status: DC
  Administered 2018-10-03: 5 [IU] via SUBCUTANEOUS
  Administered 2018-10-04: 8 [IU] via SUBCUTANEOUS
  Administered 2018-10-04 – 2018-10-05 (×2): 2 [IU] via SUBCUTANEOUS

## 2018-10-03 MED ORDER — OXYCODONE HCL 5 MG PO TABS
15.0000 mg | ORAL_TABLET | Freq: Four times a day (QID) | ORAL | Status: DC | PRN
  Administered 2018-10-03 – 2018-10-05 (×5): 15 mg via ORAL
  Filled 2018-10-03 (×5): qty 3

## 2018-10-03 MED ORDER — SORBITOL 70 % SOLN
960.0000 mL | TOPICAL_OIL | Freq: Once | ORAL | Status: DC | PRN
  Filled 2018-10-03: qty 473

## 2018-10-03 MED ORDER — SENNOSIDES-DOCUSATE SODIUM 8.6-50 MG PO TABS
2.0000 | ORAL_TABLET | Freq: Two times a day (BID) | ORAL | Status: DC
  Administered 2018-10-03 – 2018-10-05 (×5): 2 via ORAL
  Filled 2018-10-03 (×4): qty 2

## 2018-10-03 MED ORDER — MAGNESIUM CITRATE PO SOLN
1.0000 | Freq: Once | ORAL | Status: AC
  Administered 2018-10-03: 1 via ORAL
  Filled 2018-10-03: qty 296

## 2018-10-03 NOTE — Care Management (Signed)
Admitted with cellulitis. CM consult for [facility] needs. Pt is from home, lives with spouse and is ind pta. Pt has insurance and PCP. Pt on [facility] registry but not active. Pt has had 0 ED visits and only 1 admission in 6 months. Pt high readmission risk d/t number of active medications. Per attending pt has no HH needs and will return home with self care at [place]. CM may be re-consulted if needs arise.

## 2018-10-03 NOTE — Progress Notes (Addendum)
PROGRESS NOTE    Katherine Herrera  BHA:[id number] DOB: [birth year] DOA: 09/28/2018 PCP: Cassandria Anger, MD    Brief Narrative:  52 year old female with a history of insulin-dependent diabetes, hypertension, hypothyroidism, chronic pain syndrome, diabetic neuropathy in her feet, recent resection of her distal fifth metatarsal on the left, presented to the emergency room with fevers, chills and ankle pain.  She was found to have swelling and redness in her left lower extremity consistent with a cellulitis.  She was admitted for further treatments.  She also complains of numbness/tingling in her hands bilaterally with occasional jerking for the past few days.  Assessment & Plan:   Principal Problem:   Sepsis due to cellulitis [facility]) Active Problems:   Hypothyroidism   Anxiety state   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)   Ulcer of foot due to diabetes mellitus (HCC)   Mild renal insufficiency   Hyponatremia   Chronic pain   Cellulitis of left lower leg   Diabetic foot ulcer (HCC)   Polyneuropathy due to type 2 diabetes mellitus ([place])  1. Sepsis.  Resolved now.  Secondary to left leg cellulitis.  Blood cultures show positive for Streptococcus.  Continue on ceftriaxone as highly sensitive to ceftriaxone per I&D and sensitivities.   Hemodynamics are stable. 2. Streptococcus bacteremia- pansensitive, treating with IV ceftriaxone.   3. Cellulitis of left lower leg - Improving.   Continue on intravenous ceftriaxone, keep elevated.  Transition to oral amoxicillin 12/5 dosed by pharmacist.  ABIs done bilaterally are greater than 1.  X-ray of left tibia/fibula does not show any deep infection.    Cellulitis did not appear to extend down to her foot.  She does have a callus on the plantar aspect of her foot, but this does not appear to be actively infected.  Only chronic changes noted in left foot. 4. Right hand paresthesias/numbness - Pt has history of bilateral carpal tunnel  syndrome s/p surgical correction.  Offered MRI but patient says that she could not tolerate MRI today even with sedation.  Appreciate neurology consultation.  EEG is pending.  This likely is a manifestation of diabetic neuropathy.    5. Opioid constipation - Mag citrate, SMOG enema ordered, continue scheduled miralax, peri-colace.  6. Generalized Anxiety Disorder.  Continue on clonazepam and Lamictal. 7. Chronic pain syndrome.  Continue on oxycodone, Neurontin and baclofen. 8. Hypothyroidism.  Continue Synthroid 9. Uncontrolled type 2 insulin requiring Diabetes Mellitus - controlled but not fully optimal, increased 70/30 insulin to 60 units twice daily with meals.  CBGs to 5x per day.  Monitor for hypoglycemia.    DVT prophylaxis: Heparin Code Status: Full code Family Communication: No family present Disposition Plan: Discharge home in 1-2 days  Consultants:     Procedures:     Antimicrobials:   Cefepime 11/29 > 12/1  Flagyl 11/29 > 12/1  Ceftriaxone 12/1 >  Subjective: Pt reporting less paresthesias in right arm, and less pain and swelling in left leg  Objective: Vitals:   10/02/18 1439 10/02/18 2015 10/02/18 2151 10/03/18 0554  BP: 113/69  102/71 124/82  Pulse: 88  90 72  Resp: 18  18 18   Temp:   98.4 F (36.9 C) 97.9 F (36.6 C)  TempSrc:   Oral Oral  SpO2: 96% 98% 95% 97%  Weight:      Height:       No intake or output data in the 24 hours ending 10/03/18 1414 Filed Weights   09/28/18 2028  Weight: 117.9 kg   Examination:  General exam: Appears calm and comfortable.   Respiratory system: Clear to auscultation. Respiratory effort normal. Cardiovascular system: S1 & S2 heard, RRR. No JVD, murmurs, rubs, gallops or clicks. No pedal edema. Gastrointestinal system: Abdomen is nondistended, soft and nontender. No organomegaly or masses felt. Normal bowel sounds heard. Central nervous system: Alert and oriented. No focal neurological deficits. Extremities: grip  strength is decreased bilaterally, strength is 5/5 in upper arms and shoulder shrugs bilaterally. Pulses intact bilaterally Skin: erythema and edema continues to improve noted over left lower leg and calf but not surpassing the marked lines and there is increased wrinkling of skin suggesting improvement in cellulitis infection, no drainage and no blisters seen.  There is a callus noted on the plantar surface of left foot at the base of her fifth digit.  No surrounding erythema/drainage/swelling noted in her foot.     Psychiatry: anxiety noted.    Data Reviewed: I have personally reviewed following labs and imaging studies  CBC: Recent Labs  Lab 09/28/18 2112 09/29/18 0656 10/01/18 0503 10/02/18 0415  WBC 19.3* 14.4* 10.3 8.6  NEUTROABS 17.1* 12.3*  --  4.6  HGB 14.2 12.2 11.4* 11.9*  HCT 45.3 39.4 37.2 39.7  MCV 88.0 90.6 91.0 91.7  PLT 342 240 232 536   Basic Metabolic Panel: Recent Labs  Lab 09/28/18 2112 09/29/18 0656 09/30/18 1144 10/01/18 0503 10/02/18 0415 10/03/18 0532  NA 131* 135  --  135 136  --   K 4.6 4.1  --  4.5 4.7  --   CL 93* 102  --  101 101  --   CO2 27 25  --  27 27  --   GLUCOSE 231* 211*  --  184* 141*  --   BUN 12 13  --  24* 29*  --   CREATININE 1.10* 0.94  --  1.06* 1.09* 1.13*  CALCIUM 9.1 7.9*  --  8.4* 8.3*  --   MG  --   --  2.1  --  2.5*  --   PHOS  --   --  3.1  --   --   --    GFR: Estimated Creatinine Clearance: 79.9 mL/min (A) (by C-G formula based on SCr of 1.13 mg/dL (H)). Liver Function Tests: Recent Labs  Lab 09/28/18 2112 10/02/18 0415  AST 16 15  ALT 15 11  ALKPHOS 106 77  BILITOT 0.5 0.4  PROT 9.2* 7.3  ALBUMIN 4.3 3.1*   No results for input(s): LIPASE, AMYLASE in the last 168 hours. Recent Labs  Lab 09/30/18 1144  AMMONIA 18   Coagulation Profile: No results for input(s): INR, PROTIME in the last 168 hours. Cardiac Enzymes: No results for input(s): CKTOTAL, CKMB, CKMBINDEX, TROPONINI in the last 168  hours. BNP (last 3 results) No results for input(s): PROBNP in the last 8760 hours. HbA1C: No results for input(s): HGBA1C in the last 72 hours. CBG: Recent Labs  Lab 10/02/18 1632 10/02/18 2152 10/03/18 0255 10/03/18 0759 10/03/18 1210  GLUCAP 151* 236* 233* 220* 96   Lipid Profile: No results for input(s): CHOL, HDL, LDLCALC, TRIG, CHOLHDL, LDLDIRECT in the last 72 hours. Thyroid Function Tests: No results for input(s): TSH, T4TOTAL, FREET4, T3FREE, THYROIDAB in the last 72 hours. Anemia Panel: No results for input(s): VITAMINB12, FOLATE, FERRITIN, TIBC, IRON, RETICCTPCT in the last 72 hours. Sepsis Labs: Recent Labs  Lab 09/28/18 2112 09/28/18 2348  LATICACIDVEN 2.2* 2.5*    Recent  Results (from the past 240 hour(s))  Blood Culture (routine x 2)     Status: Abnormal   Collection Time: 09/28/18  9:12 PM  Result Value Ref Range Status   Specimen Description   Final    BLOOD RIGHT ANTECUBITAL Performed at [facility], [place] [address], [place], [place] [address]    Special Requests   Final    BOTTLES DRAWN AEROBIC AND ANAEROBIC Blood Culture adequate volume Performed at [facility], [address], [place], [place] [address]    Culture  Setup Time   Final    GRAM POSITIVE COCCI Gram Stain Report Called to,Read Back By and Verified With: FOLEY B. @ 1093 ON [id number] BY HENDERSON L. GRAM STAIN REVIEWED-AGREE WITH RESULT CRITICAL RESULT CALLED TO, READ BACK BY AND VERIFIED WITH: RN Marye Round FOLEY [phone] [id number] FCP Performed at [facility], [place] [address] [place], [place] [address]    Culture STREPTOCOCCUS GROUP C (A)  Final   Report Status 10/01/2018 FINAL  Final   Organism ID, Bacteria STREPTOCOCCUS GROUP C  Final      Susceptibility   Streptococcus group c - MIC*    CLINDAMYCIN <=0.25 SENSITIVE Sensitive     AMPICILLIN <=0.25 SENSITIVE Sensitive     ERYTHROMYCIN <=0.12 SENSITIVE Sensitive     VANCOMYCIN 0.5 SENSITIVE Sensitive     CEFTRIAXONE <=0.12  SENSITIVE Sensitive     LEVOFLOXACIN 0.5 SENSITIVE Sensitive     PENICILLIN Value in next row Sensitive      SENSITIVE<=0.06    * STREPTOCOCCUS GROUP C  Blood Culture ID Panel (Reflexed)     Status: Abnormal   Collection Time: 09/28/18  9:12 PM  Result Value Ref Range Status   Enterococcus species NOT DETECTED NOT DETECTED Final   Listeria monocytogenes NOT DETECTED NOT DETECTED Final   Staphylococcus species NOT DETECTED NOT DETECTED Final   Staphylococcus aureus (BCID) NOT DETECTED NOT DETECTED Final   Streptococcus species DETECTED (A) NOT DETECTED Final    Comment: Not Enterococcus species, Streptococcus agalactiae, Streptococcus pyogenes, or Streptococcus pneumoniae. CRITICAL RESULT CALLED TO, READ BACK BY AND VERIFIED WITH: RN [id number] [place] [phone] FCP    Streptococcus agalactiae NOT DETECTED NOT DETECTED Final   Streptococcus pneumoniae NOT DETECTED NOT DETECTED Final   Streptococcus pyogenes NOT DETECTED NOT DETECTED Final   Acinetobacter baumannii NOT DETECTED NOT DETECTED Final   Enterobacteriaceae species NOT DETECTED NOT DETECTED Final   Enterobacter cloacae complex NOT DETECTED NOT DETECTED Final   Escherichia coli NOT DETECTED NOT DETECTED Final   Klebsiella oxytoca NOT DETECTED NOT DETECTED Final   Klebsiella pneumoniae NOT DETECTED NOT DETECTED Final   Proteus species NOT DETECTED NOT DETECTED Final   Serratia marcescens NOT DETECTED NOT DETECTED Final   Haemophilus influenzae NOT DETECTED NOT DETECTED Final   Neisseria meningitidis NOT DETECTED NOT DETECTED Final   Pseudomonas aeruginosa NOT DETECTED NOT DETECTED Final   Candida albicans NOT DETECTED NOT DETECTED Final   Candida glabrata NOT DETECTED NOT DETECTED Final   Candida krusei NOT DETECTED NOT DETECTED Final   Candida parapsilosis NOT DETECTED NOT DETECTED Final   Candida tropicalis NOT DETECTED NOT DETECTED Final    Comment: Performed at [facility], [place] [address], [place],  [place] [address]  Blood Culture (routine x 2)     Status: None   Collection Time: 09/28/18  9:40 PM  Result Value Ref Range Status   Specimen Description BLOOD LEFT FOREARM  Final  Special Requests   Final    BOTTLES DRAWN AEROBIC AND ANAEROBIC Blood Culture adequate volume   Culture   Final    NO GROWTH 5 DAYS Performed at [facility], [address], [place], [place] [address]    Report Status 10/03/2018 FINAL  Final  Urine culture     Status: None   Collection Time: 09/28/18 11:18 PM  Result Value Ref Range Status   Specimen Description   Final    URINE, CLEAN CATCH Performed at [facility], [address], [place], [place] [address]    Special Requests   Final    NONE Performed at [facility], [address], [place], [place] [address]    Culture   Final    NO GROWTH Performed at [facility], [place] [address], [place], [place] [address]    Report Status 09/30/2018 FINAL  Final    Radiology Studies: No results found. Scheduled Meds: . baclofen  20 mg Oral BID  . ezetimibe  10 mg Oral Daily  . feeding supplement (PRO-STAT SUGAR FREE 64)  30 mL Oral TID BM  . gabapentin  800 mg Oral QID  . heparin  5,000 Units Subcutaneous Q8H  . insulin aspart  0-15 Units Subcutaneous TID WC  . insulin aspart protamine- aspart  60 Units Subcutaneous BID WC  . lamoTRIgine  25 mg Oral BID  . levothyroxine  75 mcg Oral Q0600  . multivitamin with minerals  1 tablet Oral Daily  . nutrition supplement (JUVEN)  1 packet Oral BID BM  . polyethylene glycol  17 g Oral BID  . rOPINIRole  0.5 mg Oral QHS  . senna-docusate  2 tablet Oral BID  . sodium chloride flush  3 mL Intravenous Q12H  . vitamin B-12  1,000 mcg Oral Daily   Continuous Infusions: . cefTRIAXone (ROCEPHIN)  IV 2 g (10/02/18 1808)    LOS: 5 days   Time spent:17 mins  Irwin Brakeman, MD [facility] Pager [phone]  If 7PM-7AM, please contact night-coverage [email or web address] Password [email or web address] 10/03/2018, 2:14 PM

## 2018-10-03 NOTE — Procedures (Signed)
[facility] A. Merlene Laughter, MD     [email or web address]           HISTORY: The patient is a 52 year old who presents with episodes of shaking that this has sometimes been associated with loss of consciousness.  The studies been done to evaluate for epileptic seizures.  MEDICATIONS: Scheduled Meds: . baclofen  20 mg Oral BID  . ezetimibe  10 mg Oral Daily  . feeding supplement (PRO-STAT SUGAR FREE 64)  30 mL Oral TID BM  . gabapentin  800 mg Oral QID  . heparin  5,000 Units Subcutaneous Q8H  . insulin aspart  0-15 Units Subcutaneous TID WC  . insulin aspart protamine- aspart  60 Units Subcutaneous BID WC  . lamoTRIgine  25 mg Oral BID  . levothyroxine  75 mcg Oral Q0600  . multivitamin with minerals  1 tablet Oral Daily  . nutrition supplement (JUVEN)  1 packet Oral BID BM  . polyethylene glycol  17 g Oral BID  . rOPINIRole  0.5 mg Oral QHS  . senna-docusate  2 tablet Oral BID  . sodium chloride flush  3 mL Intravenous Q12H  . vitamin B-12  1,000 mcg Oral Daily   Continuous Infusions: . cefTRIAXone (ROCEPHIN)  IV 2 g (10/02/18 1808)   PRN Meds:.acetaminophen **OR** acetaminophen, bisacodyl, clonazePAM, ondansetron **OR** ondansetron (ZOFRAN) IV, oxyCODONE, sodium chloride, sorbitol, milk of mag, mineral oil, glycerin (SMOG) enema  Prior to Admission medications   Medication Sig Start Date End Date Taking? Authorizing Provider  aspirin (BAYER ASPIRIN) 325 MG tablet Take 1 tablet (325 mg total) by mouth daily. 06/16/15  Yes Plotnikov, [name], MD  Cholecalciferol (VITAMIN D3) 1000 UNIT tablet Take 1,000 Units by mouth daily.    Yes [provider]  clonazePAM (KLONOPIN) 0.5 MG tablet TAKE ONE TABLET BY MOUTH 2 TIMES A DAY AS NEEDED FOR ANXIETY Patient taking differently: Take 0.5 mg by mouth 2 (two) times daily as needed for anxiety.  12/28/17  Yes Plotnikov, [name], MD  docusate sodium (COLACE) 100 MG capsule Take 200 mg by mouth every evening.   Yes  [provider]  ezetimibe (ZETIA) 10 MG tablet TAKE ONE TABLET BY MOUTH EVERY DAY Patient taking differently: Take 10 mg by mouth daily.  06/13/18  Yes Plotnikov, [name], MD  gabapentin (NEURONTIN) 800 MG tablet TAKE 1 TABLET BY MOUTH FOUR TIMES DAILY Patient taking differently: Take 800 mg by mouth 4 (four) times daily.  04/18/18  Yes Renato Shin, MD  insulin NPH-regular Human (NOVOLIN 70/30) (70-30) 100 UNIT/ML injection 60 units with breakfast, and 35 units with supper, and syringes Patient taking differently: Inject 60 Units into the skin 2 (two) times daily with a meal.  11/13/17  Yes Renato Shin, MD  lisinopril (PRINIVIL,ZESTRIL) 10 MG tablet TAKE ONE TABLET BY MOUTH EVERY DAY Patient taking differently: Take 10 mg by mouth daily.  09/17/18  Yes Plotnikov, [name], MD  loratadine (CLARITIN) 10 MG tablet Take 10 mg by mouth daily as needed for allergies.   Yes [provider]  oxyCODONE (ROXICODONE) 15 MG immediate release tablet Take 1 tablet (15 mg total) by mouth every 6 (six) hours as needed for pain. 08/29/18  Yes Plotnikov, [name], MD  rOPINIRole (REQUIP) 0.5 MG tablet TAKE ONE TABLET BY MOUTH AT BEDTIME Patient taking differently: Take 0.5 mg by mouth at bedtime.  02/21/18  Yes Plotnikov, [name], MD  vitamin B-12 (CYANOCOBALAMIN) 1000 MCG tablet Take 1,000 mcg by mouth daily.  Yes [provider]  B-D INS SYR ULTRAFINE 1CC/31G 31G X 5/16" 1 ML MISC USE AS DIRECTED 09/08/16   Renato Shin, MD  baclofen (LIORESAL) 10 MG tablet Take 2 tablets (20 mg total) by mouth 2 (two) times daily. 10/03/18   Plotnikov, [name], MD  glucose blood (ONETOUCH VERIO) test strip Use to check blood sugar 2 times per day. 06/24/16   Renato Shin, MD  Insulin Syringe-Needle U-100 (SURE COMFORT INSULIN SYRINGE) 31G X 5/16" 0.5 ML MISC USE AS DIRECTED WITH INSULIN 4 TIMES A DAY 09/08/17   Renato Shin, MD  lamoTRIgine (LAMICTAL) 25 MG tablet TAKE 1 TABLET BY MOUTH TWO   TIMES DAILY 10/03/18   Plotnikov, [name], MD  Lancet Devices (ACCU-CHEK [facility]) lancets Test two times daily 07/01/16   Renato Shin, MD  levothyroxine (SYNTHROID, LEVOTHROID) 75 MCG tablet TAKE 1 TABLET BY MOUTH  DAILY 10/03/18   Plotnikov, [name], MD  [facility] DELICA LANCETS 09G [place] TEST TWO TIMES DAILY 04/18/18   Renato Shin, MD      ANALYSIS: A 16 channel recording using standard 10 20 measurements is conducted for 24 minutes.  There is a well-formed posterior dominant rhythm of 11- 12 Hz which attenuates with eye opening.  There is beta activity observed in the frontal areas.  Awake and sleep architecture is documented.  In fact, most of the recording occurs during stage II non-REM sleep.  Prominent K complexes and sleep spindles are observed.  Photic stimulation and hyperventilation are not conducted.  There is no focal or lateralized slowing.  There is no epileptiform activity is observed.   IMPRESSION: 1.  This is a normal recording awake and sleep states.      Jobin Montelongo A. Merlene Laughter, M.D.  Diplomate, Tax adviser of Psychiatry and Neurology ( Neurology).

## 2018-10-03 NOTE — Progress Notes (Signed)
EEG completed. Results pending

## 2018-10-03 NOTE — Care Management Important Message (Signed)
Important Message  Patient Details  Name: Katherine Herrera MRN: [id number] Date of Birth: 12/16/1965   Medicare Important Message Given:  Yes    Shelda Altes 10/03/2018, 1:57 PM

## 2018-10-03 NOTE — Plan of Care (Signed)

## 2018-10-04 ENCOUNTER — Encounter: Payer: Medicare Other | Admitting: Podiatry

## 2018-10-04 LAB — CBC WITH DIFFERENTIAL/PLATELET
Abs Immature Granulocytes: 0.06 10*3/uL (ref 0.00–0.07)
Basophils Absolute: 0.1 10*3/uL (ref 0.0–0.1)
Basophils Relative: 1 %
Eosinophils Absolute: 0.3 10*3/uL (ref 0.0–0.5)
Eosinophils Relative: 3 %
HCT: 41.5 % (ref 36.0–46.0)
Hemoglobin: 12.8 g/dL (ref 12.0–15.0)
Immature Granulocytes: 1 %
Lymphocytes Relative: 27 %
Lymphs Abs: 2.6 10*3/uL (ref 0.7–4.0)
MCH: 27.7 pg (ref 26.0–34.0)
MCHC: 30.8 g/dL (ref 30.0–36.0)
MCV: 89.8 fL (ref 80.0–100.0)
Monocytes Absolute: 0.7 10*3/uL (ref 0.1–1.0)
Monocytes Relative: 7 %
Neutro Abs: 6.1 10*3/uL (ref 1.7–7.7)
Neutrophils Relative %: 61 %
Platelets: 309 10*3/uL (ref 150–400)
RBC: 4.62 MIL/uL (ref 3.87–5.11)
RDW: 12.4 % (ref 11.5–15.5)
WBC: 9.9 10*3/uL (ref 4.0–10.5)
nRBC: 0 % (ref 0.0–0.2)

## 2018-10-04 LAB — GLUCOSE, CAPILLARY
Glucose-Capillary: 118 mg/dL — ABNORMAL HIGH (ref 70–99)
Glucose-Capillary: 141 mg/dL — ABNORMAL HIGH (ref 70–99)
Glucose-Capillary: 81 mg/dL (ref 70–99)
Glucose-Capillary: 278 mg/dL — ABNORMAL HIGH (ref 70–99)
Glucose-Capillary: 105 mg/dL — ABNORMAL HIGH (ref 70–99)

## 2018-10-04 LAB — BASIC METABOLIC PANEL
Anion gap: 8 (ref 5–15)
BUN: 25 mg/dL — ABNORMAL HIGH (ref 6–20)
CO2: 31 mmol/L (ref 22–32)
Calcium: 8.9 mg/dL (ref 8.9–10.3)
Chloride: 100 mmol/L (ref 98–111)
Creatinine, Ser: 0.92 mg/dL (ref 0.44–1.00)
GFR calc Af Amer: >60 mL/min (ref >60)
GFR calc non Af Amer: >60 mL/min (ref >60)
Glucose, Bld: 132 mg/dL — ABNORMAL HIGH (ref 70–99)
Potassium: 4.4 mmol/L (ref 3.5–5.1)
Sodium: 139 mmol/L (ref 135–145)

## 2018-10-04 MED ORDER — SORBITOL 70 % SOLN
960.0000 mL | TOPICAL_OIL | Freq: Once | ORAL | Status: AC
  Administered 2018-10-04: 960 mL via RECTAL
  Filled 2018-10-04: qty 473

## 2018-10-04 MED ORDER — AMOXICILLIN 250 MG PO CAPS
500.0000 mg | ORAL_CAPSULE | Freq: Three times a day (TID) | ORAL | Status: DC
  Administered 2018-10-05: 500 mg via ORAL
  Filled 2018-10-04: qty 2

## 2018-10-04 NOTE — Plan of Care (Signed)

## 2018-10-04 NOTE — Progress Notes (Signed)
PROGRESS NOTE    Katherine Herrera  IRS:[id number] DOB: 1966/03/02 DOA: 09/28/2018 PCP: Cassandria Anger, MD    Brief Narrative:  52 year old female with a history of insulin-dependent diabetes, hypertension, hypothyroidism, chronic pain syndrome, diabetic neuropathy in her feet, recent resection of her distal fifth metatarsal on the left, presented to the emergency room with fevers, chills and ankle pain.  She was found to have swelling and redness in her left lower extremity consistent with a cellulitis.  She was admitted for further treatments.  She also complains of numbness/tingling in her hands bilaterally with occasional jerking for the past few days.  Assessment & Plan:   Principal Problem:   Sepsis due to cellulitis [facility]) Active Problems:   Hypothyroidism   Anxiety state   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)   Ulcer of foot due to diabetes mellitus (HCC)   Mild renal insufficiency   Hyponatremia   Chronic pain   Cellulitis of left lower leg   Diabetic foot ulcer (HCC)   Polyneuropathy due to type 2 diabetes mellitus ([place])  1. Sepsis.  Resolved now.  Secondary to left leg cellulitis.  Blood cultures show positive for Streptococcus.  Continue on ceftriaxone as highly sensitive to ceftriaxone per I&D and sensitivities.   Hemodynamics are stable. 2. Streptococcus bacteremia- pansensitive, treating with IV ceftriaxone.   3. Cellulitis of left lower leg - Improving.   Continue on intravenous ceftriaxone, keep elevated.  Transitioned to oral amoxicillin 12/5 dosed by pharmacist.  ABIs done bilaterally are greater than 1.  X-ray of left tibia/fibula does not show any deep infection.    Cellulitis did not appear to extend down to her foot.  She does have a callus on the plantar aspect of her foot, but this does not appear to be actively infected.  Only chronic changes noted in left foot. 4. Right hand paresthesias/numbness - Pt has history of bilateral carpal tunnel  syndrome s/p surgical correction.  Offered MRI but patient says that she could not tolerate MRI today even with sedation.  Appreciate neurology consultation.  EEG is pending.  This likely is a manifestation of diabetic neuropathy.    5. Opioid constipation - Mag citrate, SMOG enema ordered, continue scheduled miralax, peri-colace. SMOG enema this morning.  6. Generalized Anxiety Disorder.  Continue on clonazepam and Lamictal. 7. Chronic pain syndrome.  Continue on oxycodone, Neurontin and baclofen. 8. Hypothyroidism.  Continue Synthroid 9. Uncontrolled type 2 insulin requiring Diabetes Mellitus - better controlled after increasing 70/30 insulin to 60 units twice daily with meals.  CBGs to 5x per day.  Monitor for hypoglycemia.    DVT prophylaxis: Heparin Code Status: Full code Family Communication: No family present Disposition Plan: Discharge home tomorrow if medically stable  Consultants:     Procedures:     Antimicrobials:   Cefepime 11/29 > 12/1  Flagyl 11/29 > 12/1  Ceftriaxone 12/1 >  Subjective: Pt reporting she still has not had a bowel movement.   Objective: Vitals:   10/03/18 0554 10/03/18 1939 10/03/18 2208 10/04/18 0547  BP: 124/82  121/61 107/77  Pulse: 72  80 69  Resp: 18  18 18   Temp: 97.9 F (36.6 C)  98.7 F (37.1 C) 98.2 F (36.8 C)  TempSrc: Oral  Oral Oral  SpO2: 97% 93% 100% 100%  Weight:      Height:        Intake/Output Summary (Last 24 hours) at 10/04/2018 1129 Last data filed at 10/04/2018 0900 Gross  per 24 hour  Intake 240 ml  Output -  Net 240 ml   Filed Weights   09/28/18 2028  Weight: 117.9 kg   Examination:  General exam: Appears calm and comfortable.   Respiratory system: Clear to auscultation. Respiratory effort normal. Cardiovascular system: S1 & S2 heard, RRR. No JVD, murmurs, rubs, gallops or clicks. No pedal edema. Gastrointestinal system: Abdomen is nondistended, soft and nontender. No organomegaly or masses felt.  Normal bowel sounds heard. Central nervous system: Alert and oriented. No focal neurological deficits. Extremities: grip strength is decreased bilaterally, strength is 5/5 in upper arms and shoulder shrugs bilaterally. Pulses intact bilaterally Skin: erythema and edema continues to improve noted over left lower leg and calf but not surpassing the marked lines and there is increased wrinkling of skin suggesting improvement in cellulitis infection, no drainage and no blisters seen.  There is a callus noted on the plantar surface of left foot at the base of her fifth digit.  No surrounding erythema/drainage/swelling noted in her foot.        Psychiatry: anxiety noted.    Data Reviewed: I have personally reviewed following labs and imaging studies  CBC: Recent Labs  Lab 09/28/18 2112 09/29/18 0656 10/01/18 0503 10/02/18 0415 10/04/18 0451  WBC 19.3* 14.4* 10.3 8.6 9.9  NEUTROABS 17.1* 12.3*  --  4.6 6.1  HGB 14.2 12.2 11.4* 11.9* 12.8  HCT 45.3 39.4 37.2 39.7 41.5  MCV 88.0 90.6 91.0 91.7 89.8  PLT 342 240 232 267 694   Basic Metabolic Panel: Recent Labs  Lab 09/28/18 2112 09/29/18 0656 09/30/18 1144 10/01/18 0503 10/02/18 0415 10/03/18 0532 10/04/18 0451  NA 131* 135  --  135 136  --  139  K 4.6 4.1  --  4.5 4.7  --  4.4  CL 93* 102  --  101 101  --  100  CO2 27 25  --  27 27  --  31  GLUCOSE 231* 211*  --  184* 141*  --  132*  BUN 12 13  --  24* 29*  --  25*  CREATININE 1.10* 0.94  --  1.06* 1.09* 1.13* 0.92  CALCIUM 9.1 7.9*  --  8.4* 8.3*  --  8.9  MG  --   --  2.1  --  2.5*  --   --   PHOS  --   --  3.1  --   --   --   --    GFR: Estimated Creatinine Clearance: 98.1 mL/min (by C-G formula based on SCr of 0.92 mg/dL). Liver Function Tests: Recent Labs  Lab 09/28/18 2112 10/02/18 0415  AST 16 15  ALT 15 11  ALKPHOS 106 77  BILITOT 0.5 0.4  PROT 9.2* 7.3  ALBUMIN 4.3 3.1*   No results for input(s): LIPASE, AMYLASE in the last 168 hours. Recent Labs    Lab 09/30/18 1144  AMMONIA 18   Coagulation Profile: No results for input(s): INR, PROTIME in the last 168 hours. Cardiac Enzymes: No results for input(s): CKTOTAL, CKMB, CKMBINDEX, TROPONINI in the last 168 hours. BNP (last 3 results) No results for input(s): PROBNP in the last 8760 hours. HbA1C: No results for input(s): HGBA1C in the last 72 hours. CBG: Recent Labs  Lab 10/03/18 1210 10/03/18 1614 10/03/18 2209 10/04/18 0358 10/04/18 0741  GLUCAP 96 104* 159* 118* 141*   Lipid Profile: No results for input(s): CHOL, HDL, LDLCALC, TRIG, CHOLHDL, LDLDIRECT in the last 72 hours. Thyroid Function Tests:  No results for input(s): TSH, T4TOTAL, FREET4, T3FREE, THYROIDAB in the last 72 hours. Anemia Panel: No results for input(s): VITAMINB12, FOLATE, FERRITIN, TIBC, IRON, RETICCTPCT in the last 72 hours. Sepsis Labs: Recent Labs  Lab 09/28/18 2112 09/28/18 2348  LATICACIDVEN 2.2* 2.5*    Recent Results (from the past 240 hour(s))  Blood Culture (routine x 2)     Status: Abnormal   Collection Time: 09/28/18  9:12 PM  Result Value Ref Range Status   Specimen Description   Final    BLOOD RIGHT ANTECUBITAL Performed at [facility], [place] [address] [place], [place] [address]    Special Requests   Final    BOTTLES DRAWN AEROBIC AND ANAEROBIC Blood Culture adequate volume Performed at [facility], [address], [place], [place] [address]    Culture  Setup Time   Final    GRAM POSITIVE COCCI Gram Stain Report Called to,Read Back By and Verified With: FOLEY B. @ 1093 ON [id number] BY HENDERSON L. GRAM STAIN REVIEWED-AGREE WITH RESULT CRITICAL RESULT CALLED TO, READ BACK BY AND VERIFIED WITH: RN Marye Round FOLEY [phone] [id number] FCP Performed at [facility], [place] [address] [place], [place] [address]    Culture STREPTOCOCCUS GROUP C (A)  Final   Report Status 10/01/2018 FINAL  Final   Organism ID, Bacteria STREPTOCOCCUS GROUP C  Final      Susceptibility    Streptococcus group c - MIC*    CLINDAMYCIN <=0.25 SENSITIVE Sensitive     AMPICILLIN <=0.25 SENSITIVE Sensitive     ERYTHROMYCIN <=0.12 SENSITIVE Sensitive     VANCOMYCIN 0.5 SENSITIVE Sensitive     CEFTRIAXONE <=0.12 SENSITIVE Sensitive     LEVOFLOXACIN 0.5 SENSITIVE Sensitive     PENICILLIN Value in next row Sensitive      SENSITIVE<=0.06    * STREPTOCOCCUS GROUP C  Blood Culture ID Panel (Reflexed)     Status: Abnormal   Collection Time: 09/28/18  9:12 PM  Result Value Ref Range Status   Enterococcus species NOT DETECTED NOT DETECTED Final   Listeria monocytogenes NOT DETECTED NOT DETECTED Final   Staphylococcus species NOT DETECTED NOT DETECTED Final   Staphylococcus aureus (BCID) NOT DETECTED NOT DETECTED Final   Streptococcus species DETECTED (A) NOT DETECTED Final    Comment: Not Enterococcus species, Streptococcus agalactiae, Streptococcus pyogenes, or Streptococcus pneumoniae. CRITICAL RESULT CALLED TO, READ BACK BY AND VERIFIED WITH: RN [id number] [place] [phone] [id number] FCP    Streptococcus agalactiae NOT DETECTED NOT DETECTED Final   Streptococcus pneumoniae NOT DETECTED NOT DETECTED Final   Streptococcus pyogenes NOT DETECTED NOT DETECTED Final   Acinetobacter baumannii NOT DETECTED NOT DETECTED Final   Enterobacteriaceae species NOT DETECTED NOT DETECTED Final   Enterobacter cloacae complex NOT DETECTED NOT DETECTED Final   Escherichia coli NOT DETECTED NOT DETECTED Final   Klebsiella oxytoca NOT DETECTED NOT DETECTED Final   Klebsiella pneumoniae NOT DETECTED NOT DETECTED Final   Proteus species NOT DETECTED NOT DETECTED Final   Serratia marcescens NOT DETECTED NOT DETECTED Final   Haemophilus influenzae NOT DETECTED NOT DETECTED Final   Neisseria meningitidis NOT DETECTED NOT DETECTED Final   Pseudomonas aeruginosa NOT DETECTED NOT DETECTED Final   Candida albicans NOT DETECTED NOT DETECTED Final   Candida glabrata NOT DETECTED NOT DETECTED Final   Candida  krusei NOT DETECTED NOT DETECTED Final   Candida parapsilosis NOT DETECTED NOT DETECTED Final   Candida tropicalis NOT DETECTED NOT DETECTED Final    Comment:  Performed at [facility], [place] [address] [place], [place] [address]  Blood Culture (routine x 2)     Status: None   Collection Time: 09/28/18  9:40 PM  Result Value Ref Range Status   Specimen Description BLOOD LEFT FOREARM  Final   Special Requests   Final    BOTTLES DRAWN AEROBIC AND ANAEROBIC Blood Culture adequate volume   Culture   Final    NO GROWTH 5 DAYS Performed at [facility], [address], [place], [place] [address]    Report Status 10/03/2018 FINAL  Final  Urine culture     Status: None   Collection Time: 09/28/18 11:18 PM  Result Value Ref Range Status   Specimen Description   Final    URINE, CLEAN CATCH Performed at [facility], [address], [place], [place] [address]    Special Requests   Final    NONE Performed at [facility], [address], [place], [place] [address]    Culture   Final    NO GROWTH Performed at [facility], [place] [address] [place], [place] [address]    Report Status 09/30/2018 FINAL  Final    Radiology Studies: No results found. Scheduled Meds: . baclofen  20 mg Oral BID  . ezetimibe  10 mg Oral Daily  . feeding supplement (PRO-STAT SUGAR FREE 64)  30 mL Oral TID BM  . gabapentin  800 mg Oral QID  . heparin  5,000 Units Subcutaneous Q8H  . insulin aspart  0-15 Units Subcutaneous TID WC  . insulin aspart protamine- aspart  60 Units Subcutaneous BID WC  . lamoTRIgine  25 mg Oral BID  . levothyroxine  75 mcg Oral Q0600  . multivitamin with minerals  1 tablet Oral Daily  . nutrition supplement (JUVEN)  1 packet Oral BID BM  . polyethylene glycol  17 g Oral BID  . rOPINIRole  0.5 mg Oral QHS  . senna-docusate  2 tablet Oral BID  . sodium chloride flush  3 mL Intravenous Q12H  . vitamin B-12  1,000 mcg Oral Daily   Continuous Infusions: . cefTRIAXone  (ROCEPHIN)  IV 2 g (10/03/18 1737)    LOS: 6 days   Time spent:19 mins  Irwin Brakeman, MD [facility] Pager [phone]  If 7PM-7AM, please contact night-coverage [email or web address] Password [email or web address] 10/04/2018, 11:29 AM

## 2018-10-05 ENCOUNTER — Telehealth: Payer: Self-pay | Admitting: *Deleted

## 2018-10-05 LAB — GLUCOSE, CAPILLARY
Glucose-Capillary: 129 mg/dL — ABNORMAL HIGH (ref 70–99)
Glucose-Capillary: 145 mg/dL — ABNORMAL HIGH (ref 70–99)

## 2018-10-05 MED ORDER — PROBIOTIC 250 MG PO CAPS: 1.0000 | ORAL_CAPSULE | Freq: Two times a day (BID) | ORAL | Status: DC

## 2018-10-05 MED ORDER — AMOXICILLIN 500 MG PO CAPS: 500.0000 mg | ORAL_CAPSULE | Freq: Three times a day (TID) | ORAL | 0 refills | Status: AC

## 2018-10-05 MED ORDER — INSULIN NPH ISOPHANE & REGULAR (70-30) 100 UNIT/ML ~~LOC~~ SUSP: 60.0000 [IU] | Freq: Two times a day (BID) | SUBCUTANEOUS | Status: DC

## 2018-10-05 MED ORDER — ROPINIROLE HCL 0.5 MG PO TABS: 0.5000 mg | ORAL_TABLET | Freq: Every day | ORAL | Status: DC

## 2018-10-05 MED ORDER — CLONAZEPAM 0.5 MG PO TABS: 0.5000 mg | ORAL_TABLET | Freq: Two times a day (BID) | ORAL | Status: DC | PRN

## 2018-10-05 MED ORDER — GABAPENTIN 800 MG PO TABS: 800.0000 mg | ORAL_TABLET | Freq: Four times a day (QID) | ORAL | Status: DC

## 2018-10-05 NOTE — Telephone Encounter (Signed)
Called pt to make hosp follow-up no answer LMOM RTC, Sent CRM to [facility] for fyi.Marland KitchenJohny Chess

## 2018-10-05 NOTE — Progress Notes (Signed)
Per patient IV catheter removed last night. Discharge instructions including medications and follow up appointments were reviewed and discussed with patient. All questions were answered and no further questions at this time. Pt in stable condition and in no acute distress at time of discharge. Pt escorted by nurse tech.

## 2018-10-05 NOTE — Discharge Instructions (Signed)
Please start taking probiotics over the counter for next 2 weeks  Antibiotic Medicine, Adult Antibiotic medicines treat infections caused by a type of germ called bacteria. They work by killing the bacteria that make you sick. When do I need to take antibiotics? You often need these medicines to treat bacterial infections, such as:  A urinary tract infection (UTI).  Strep throat.  Meningitis. This affects the spinal cord and brain.  A bad lung infection.  You may start the medicines while your doctor waits for tests to come back. When the tests come back, your doctor may change or stop your medicine. When are antibiotics not needed? You do not need these medicines for most common illnesses, such as:  A cold.  The flu.  A sore throat.  Antibiotics are not always needed for all infections caused by bacteria. Do not ask for these medicines, or take them, when they are not needed. What are the risks of taking antibiotics? Most antibiotics can cause an infection called Clostridium difficile.This causes watery poop (diarrhea). Let your doctor know right away if:  You have watery poop while taking an antibiotic.  You have watery poop after you stop taking an antibiotic. The illness can happen weeks after you stop the medicine.  You also have a risk of getting an infection in the future that antibiotics cannot treat (antibiotic-resistant infection). This type of infection can be dangerous. What else should I know about taking antibiotics?  You need to take the entire prescription. ? Take the medicine for as long as told by your doctor. ? Do not stop taking it even if you start to feel better.  Try not to miss any doses. If you miss a dose, call your doctor.  Birth control pills may not work. If you take birth control pills: ? Keep on taking them. ? Use a second form of birth control, such as a condom. Do this for as long as told by your doctor.  Ask your doctor: ? How long to  wait in between doses. ? If you should take the medicine with food. ? If there is anything you should stay away from while taking the antibiotic, such as: ? Food. ? Drinks. ? Medicines. ? If there are any side effects you should watch for.  Only take the medicines that your doctor told you to take. Do not take medicines that were given to someone else.  Drink a large glass of water with the medicine.  Ask the pharmacist for a tool to measure the medicine, such as: ? A syringe. ? A cup. ? A spoon.  Throw away any extra medicine. Contact a doctor if:  You get worse.  You have new joint pain or muscle aches after starting the medicine.  You have side effects from the medicine, such as: ? Stomach pain. ? Watery poop. ? Feeling sick to your stomach (nausea). Get help right away if:  You have signs of a very bad allergic reaction. If this happens, stop taking the medicine right away. Signs may include: ? Hives. These are raised, itchy, red bumps on the skin. ? Skin rash. ? Trouble breathing. ? Wheezing. ? Swelling. ? Feeling dizzy. ? Throwing up (vomiting).  Your pee (urine) is dark, or is the color of blood.  Your skin turns yellow.  You bruise easily.  You bleed easily.  You have very bad watery poop and cramps in your belly.  You have a very bad headache. Summary  Antibiotics are  often used to treat infections caused by bacteria.  Only take these medicines when needed.  Let your doctor know if you have watery poop while taking an antibiotic.  You need to take the entire prescription. This information is not intended to replace advice given to you by your health care provider. Make sure you discuss any questions you have with your health care provider. Document Released: 07/26/2008 Document Revised: 10/19/2016 Document Reviewed: 10/19/2016 Elsevier Interactive Patient Education  2017 Else[employer]   Cellulitis, Adult Cellulitis is a skin infection. The  infected area is usually red and sore. This condition occurs most often in the arms and lower legs. It is very important to get treated for this condition. Follow these instructions at home:  Take over-the-counter and prescription medicines only as told by your doctor.  If you were prescribed an antibiotic medicine, take it as told by your doctor. Do not stop taking the antibiotic even if you start to feel better.  Drink enough fluid to keep your pee (urine) clear or pale yellow.  Do not touch or rub the infected area.  Raise (elevate) the infected area above the level of your heart while you are sitting or lying down.  Place warm or cold wet cloths (warm or cold compresses) on the infected area. Do this as told by your doctor.  Keep all follow-up visits as told by your doctor. This is important. These visits let your doctor make sure your infection is not getting worse. Contact a doctor if:  You have a fever.  Your symptoms do not get better after 1-2 days of treatment.  Your bone or joint under the infected area starts to hurt after the skin has healed.  Your infection comes back. This can happen in the same area or another area.  You have a swollen bump in the infected area.  You have new symptoms.  You feel ill and also have muscle aches and pains. Get help right away if:  Your symptoms get worse.  You feel very sleepy.  You throw up (vomit) or have watery poop (diarrhea) for a long time.  There are red streaks coming from the infected area.  Your red area gets larger.  Your red area turns darker. This information is not intended to replace advice given to you by your health care provider. Make sure you discuss any questions you have with your health care provider. Document Released: 04/04/2008 Document Revised: 03/24/2016 Document Reviewed: 08/26/2015 Elsevier Interactive Patient Education  2018 [employer].   Follow with Primary MD  Plotnikov, [name], MD   and other consultants as instructed your Hospitalist MD  Please get a complete blood count and chemistry panel checked by your Primary MD at your next visit, and again as instructed by your Primary MD.  Get Medicines reviewed and adjusted: Please take all your medications with you for your next visit with your Primary MD  Laboratory/radiological data: Please request your Primary MD to go over all hospital tests and procedure/radiological results at the follow up, please ask your Primary MD to get all Hospital records sent to his/her office.  In some cases, they will be blood work, cultures and biopsy results pending at the time of your discharge. Please request that your primary care M.D. follows up on these results.  Also Note the following: If you experience worsening of your admission symptoms, develop shortness of breath, life threatening emergency, suicidal or homicidal thoughts you must seek medical attention immediately by calling  911 or calling your MD immediately  if symptoms less severe.  You must read complete instructions/literature along with all the possible adverse reactions/side effects for all the Medicines you take and that have been prescribed to you. Take any new Medicines after you have completely understood and accpet all the possible adverse reactions/side effects.   Do not drive when taking Pain medications or sleeping medications (Benzodaizepines)  Do not take more than prescribed Pain, Sleep and Anxiety Medications. It is not advisable to combine anxiety,sleep and pain medications without talking with your primary care practitioner  Special Instructions: If you have smoked or chewed Tobacco  in the last 2 yrs please stop smoking, stop any regular Alcohol  and or any Recreational drug use.  Wear Seat belts while driving.  Please note: You were cared for by a hospitalist during your hospital stay. Once you are discharged, your primary care physician will handle any  further medical issues. Please note that NO REFILLS for any discharge medications will be authorized once you are discharged, as it is imperative that you return to your primary care physician (or establish a relationship with a primary care physician if you do not have one) for your post hospital discharge needs so that they can reassess your need for medications and monitor your lab values.   Seek medical care or return to ER if symptoms get worse, or don't improve.

## 2018-10-05 NOTE — Discharge Summary (Signed)
Physician Discharge Summary  Katherine Herrera KXF:[id number] DOB: 1966/03/19 DOA: 09/28/2018  PCP: Katherine Anger, MD Neurologist: Dr. Merlene Laughter  Admit date: 09/28/2018 Discharge date: 10/05/2018  Admitted From: Home  Disposition: Home   Recommendations for Outpatient Follow-up:  1. Follow up with PCP in 1 weeks for recheck 2. Follow up with neurologist in 2 weeks for recheck.   Discharge Condition: STABLE   CODE STATUS: FULL    Brief Hospitalization Summary: Please see all hospital notes, images, labs for full details of the hospitalization. HPI: Katherine Herrera is a 52 y.o. female with medical history significant for insulin-dependent diabetes mellitus, hypertension, hypothyroidism, chronic pain, and diabetic ulcers involving bilateral feet status post resection of the distal fifth metatarsal on the left, now presenting to the emergency department for evaluation of fevers, chills, and left ankle pain, swelling, and redness.  Patient reports experiencing some shooting pains from the left foot over the past couple days, did not think much of it at the time, but developed fevers and chills today with worsening pain in the left foot and ankle, and noticed new redness and swelling involving the lower left leg.  There has not been any drainage from her ulcers.  She denies any chest pain, palpitations, shortness of breath, or cough.  She denies dysuria or flank pain.  She has not been on antibiotics recently.  ED Course: Upon arrival to the ED, patient is found to be febrile to 39.1 C, saturating well on room air, tachycardic in the 130s, and with stable blood pressure.  EKG features sinus tachycardia with rate 119 and repolarization abnormality.  Chest x-ray is negative for acute cardiopulmonary disease.  Chemistry panel is notable for sodium of 131, glucose 231, and creatinine 1.10, slightly higher than in September.  CBC features a leukocytosis to 19,300.  Lactic acid is elevated to 2.2.   Radiographs of the left foot and lower leg are notable for prior resection of the distal fifth metatarsal on the left and possible subtle cortical loss at the base of the fifth middle phalanx, unable to exclude early osteomyelitis.  Blood cultures were collected, 1 L of normal saline given, pain medications administered, and the patient was treated with empiric vancomycin, cefepime, and Flagyl in the ED.  Heart rate improved, blood pressure remained stable, and the patient will be admitted for ongoing evaluation and management.  Brief Narrative:  52 year old female with a history of insulin-dependent diabetes, hypertension, hypothyroidism, chronic pain syndrome, diabetic neuropathy in her feet, recent resection of her distal fifth metatarsal on the left, presented to the emergency room with fevers, chills and ankle pain.  She was found to have swelling and redness in her left lower extremity consistent with a cellulitis.  She was admitted for further treatments.  She also complains of numbness/tingling in her hands bilaterally with occasional jerking for the past few days.  Assessment & Plan:   Principal Problem:   Sepsis due to cellulitis [facility]) Active Problems:   Hypothyroidism   Anxiety state   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)   Ulcer of foot due to diabetes mellitus (HCC)   Mild renal insufficiency   Hyponatremia   Chronic pain   Cellulitis of left lower leg   Diabetic foot ulcer (HCC)   Polyneuropathy due to type 2 diabetes mellitus ([place])  1. Sepsis.  Resolved now.  Secondary to left leg cellulitis.  Blood cultures show positive for Streptococcus.  Continue on ceftriaxone as highly sensitive to ceftriaxone  per I&D and sensitivities.   Hemodynamics are stable. 2. Streptococcus bacteremia- pansensitive, treating with IV ceftriaxone then de-escalated to amoxicillin to complete full 10 days of therapy.  3. Cellulitis of left lower leg - Improving.   Treated with  intravenous ceftriaxone, keep elevated.  Transitioned to oral amoxicillin to complete full 10 day course.   ABIs done bilaterally are greater than 1.  X-ray of left tibia/fibula does not show any deep infection.    Cellulitis did not appear to extend down to her foot.  She does have a callus on the plantar aspect of her foot, but this does not appear to be actively infected.  Only chronic changes noted in left foot. 4. Right hand paresthesias/numbness - Pt has history of bilateral carpal tunnel syndrome s/p surgical correction.  Offered MRI but patient says that she could not tolerate MRI today even with sedation.  Appreciate neurology consultation.  EEG completed with normal results.  This likely is a manifestation of diabetic neuropathy.  Outpatient neurology follow up recommended.  5. Opioid constipation - Mag citrate, SMOG enema ordered, continue scheduled miralax, peri-colace. SMOG enema this morning.  Pt has had a bowel movement after the enema was given.  6. Generalized Anxiety Disorder.  Continue on home meds.  7. Chronic pain syndrome.  Continue on oxycodone, Neurontin and baclofen plus laxatives.  8. Hypothyroidism.  Continue Synthroid 9. Uncontrolled type 2 insulin requiring Diabetes Mellitus - better controlled after increasing 70/30 insulin to 60 units twice daily with meals.  CBGs to 5x per day.  Monitor for hypoglycemia.    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)   Ulcer of foot due to diabetes mellitus ([place])   Mild renal insufficiency   Hyponatremia   Chronic  pain   Cellulitis of left lower leg   Diabetic foot ulcer (HCC)   Polyneuropathy due to type 2 diabetes mellitus [facility])  Discharge Instructions: Discharge Instructions    Call MD for:  difficulty breathing, headache or visual disturbances   Complete by:  As directed    Call MD for:  extreme fatigue   Complete by:  As directed    Call MD for:  persistant dizziness or light-headedness   Complete by:  As directed    Call MD for:  persistant nausea and vomiting   Complete by:  As directed    Call MD for:  severe uncontrolled pain   Complete by:  As directed    Increase activity slowly   Complete by:  As directed      Allergies as of 10/05/2018      Reactions   Hydromorphone Hcl Itching   Cephalexin Nausea And Vomiting   Demerol  [meperidine Hcl] Rash   Lovastatin Other (See Comments)   Other reaction(s): Unknown (See Comments) Possible myalgia Unknown    Metformin And Related Nausea And Vomiting   Sulfamethoxazole-trimethoprim    Other reaction(s): Unknown DILI, pancreatitis   Crestor [rosuvastatin Calcium]    myalgia   Niacin Other (See Comments)   Unknown    Paroxetine Other (See Comments)  Other reaction(s): Unknown (See Comments) unknown Unknown       Medication List    TAKE these medications   accu-chek softclix lancets Test two times daily   amoxicillin 500 MG capsule Commonly known as:  AMOXIL Take 1 capsule (500 mg total) by mouth every 8 (eight) hours for 3 days.   aspirin 325 MG tablet Take 1 tablet (325 mg total) by mouth daily.   B-D INS SYR ULTRAFINE 1CC/31G 31G X 5/16" 1 ML Misc Generic drug:  Insulin Syringe-Needle U-100 USE AS DIRECTED   Insulin Syringe-Needle U-100 31G X 5/16" 0.5 ML Misc USE AS DIRECTED WITH INSULIN 4 TIMES A DAY   baclofen 10 MG tablet Commonly known as:  LIORESAL Take 2 tablets (20 mg total) by mouth 2 (two) times daily.   cholecalciferol 25 MCG (1000 UT) tablet Commonly known as:  VITAMIN D Take 1,000 Units by  mouth daily.   clonazePAM 0.5 MG tablet Commonly known as:  KLONOPIN Take 1 tablet (0.5 mg total) by mouth 2 (two) times daily as needed for anxiety. What changed:  See the new instructions.   docusate sodium 100 MG capsule Commonly known as:  COLACE Take 200 mg by mouth every evening.   ezetimibe 10 MG tablet Commonly known as:  ZETIA TAKE ONE TABLET BY MOUTH EVERY DAY   gabapentin 800 MG tablet Commonly known as:  NEURONTIN Take 1 tablet (800 mg total) by mouth 4 (four) times daily.   glucose blood test strip Use to check blood sugar 2 times per day.   insulin NPH-regular Human (70-30) 100 UNIT/ML injection Inject 60 Units into the skin 2 (two) times daily with a meal.   lamoTRIgine 25 MG tablet Commonly known as:  LAMICTAL TAKE 1 TABLET BY MOUTH TWO  TIMES DAILY   levothyroxine 75 MCG tablet Commonly known as:  SYNTHROID, LEVOTHROID TAKE 1 TABLET BY MOUTH  DAILY   lisinopril 10 MG tablet Commonly known as:  PRINIVIL,ZESTRIL TAKE ONE TABLET BY MOUTH EVERY DAY   loratadine 10 MG tablet Commonly known as:  CLARITIN Take 10 mg by mouth daily as needed for allergies.   ONETOUCH DELICA LANCETS 40X Misc TEST TWO TIMES DAILY   oxyCODONE 15 MG immediate release tablet Commonly known as:  ROXICODONE Take 1 tablet (15 mg total) by mouth every 6 (six) hours as needed for pain.   Probiotic 250 MG Caps Take 1 capsule by mouth 2 (two) times daily for 14 days.   rOPINIRole 0.5 MG tablet Commonly known as:  REQUIP Take 1 tablet (0.5 mg total) by mouth at bedtime.   vitamin B-12 1000 MCG tablet Commonly known as:  CYANOCOBALAMIN Take 1,000 mcg by mouth daily.      Follow-up Information    Phillips Odor, MD. Schedule an appointment as soon as possible for a visit in 2 week(s).   Specialty:  Neurology Why:  Hospital Follow Up  Contact information: [address] [place] [facility] [address] [phone]        Plotnikov, [name], MD. Schedule an appointment as  soon as possible for a visit in 1 week(s).   Specialty:  Internal Medicine Why:  Hospital Follow Up Contact information: [place] [address] [phone]          Allergies  Allergen Reactions  . Hydromorphone Hcl Itching  . Cephalexin Nausea And Vomiting  . Demerol  [Meperidine Hcl] Rash  . Lovastatin Other (See Comments)    Other reaction(s): Unknown (See  Comments) Possible myalgia Unknown   . Metformin And Related Nausea And Vomiting  . Sulfamethoxazole-Trimethoprim     Other reaction(s): Unknown DILI, pancreatitis  . Crestor [Rosuvastatin Calcium]     myalgia  . Niacin Other (See Comments)    Unknown   . Paroxetine Other (See Comments)    Other reaction(s): Unknown (See Comments) unknown Unknown    Allergies as of 10/05/2018      Reactions   Hydromorphone Hcl Itching   Cephalexin Nausea And Vomiting   Demerol  [meperidine Hcl] Rash   Lovastatin Other (See Comments)   Other reaction(s): Unknown (See Comments) Possible myalgia Unknown    Metformin And Related Nausea And Vomiting   Sulfamethoxazole-trimethoprim    Other reaction(s): Unknown DILI, pancreatitis   Crestor [rosuvastatin Calcium]    myalgia   Niacin Other (See Comments)   Unknown    Paroxetine Other (See Comments)   Other reaction(s): Unknown (See Comments) unknown Unknown       Medication List    TAKE these medications   accu-chek softclix lancets Test two times daily   amoxicillin 500 MG capsule Commonly known as:  AMOXIL Take 1 capsule (500 mg total) by mouth every 8 (eight) hours for 3 days.   aspirin 325 MG tablet Take 1 tablet (325 mg total) by mouth daily.   B-D INS SYR ULTRAFINE 1CC/31G 31G X 5/16" 1 ML Misc Generic drug:  Insulin Syringe-Needle U-100 USE AS DIRECTED   Insulin Syringe-Needle U-100 31G X 5/16" 0.5 ML Misc USE AS DIRECTED WITH INSULIN 4 TIMES A DAY   baclofen 10 MG tablet Commonly known as:  LIORESAL Take 2 tablets (20 mg total) by mouth  2 (two) times daily.   cholecalciferol 25 MCG (1000 UT) tablet Commonly known as:  VITAMIN D Take 1,000 Units by mouth daily.   clonazePAM 0.5 MG tablet Commonly known as:  KLONOPIN Take 1 tablet (0.5 mg total) by mouth 2 (two) times daily as needed for anxiety. What changed:  See the new instructions.   docusate sodium 100 MG capsule Commonly known as:  COLACE Take 200 mg by mouth every evening.   ezetimibe 10 MG tablet Commonly known as:  ZETIA TAKE ONE TABLET BY MOUTH EVERY DAY   gabapentin 800 MG tablet Commonly known as:  NEURONTIN Take 1 tablet (800 mg total) by mouth 4 (four) times daily.   glucose blood test strip Use to check blood sugar 2 times per day.   insulin NPH-regular Human (70-30) 100 UNIT/ML injection Inject 60 Units into the skin 2 (two) times daily with a meal.   lamoTRIgine 25 MG tablet Commonly known as:  LAMICTAL TAKE 1 TABLET BY MOUTH TWO  TIMES DAILY   levothyroxine 75 MCG tablet Commonly known as:  SYNTHROID, LEVOTHROID TAKE 1 TABLET BY MOUTH  DAILY   lisinopril 10 MG tablet Commonly known as:  PRINIVIL,ZESTRIL TAKE ONE TABLET BY MOUTH EVERY DAY   loratadine 10 MG tablet Commonly known as:  CLARITIN Take 10 mg by mouth daily as needed for allergies.   ONETOUCH DELICA LANCETS 37T Misc TEST TWO TIMES DAILY   oxyCODONE 15 MG immediate release tablet Commonly known as:  ROXICODONE Take 1 tablet (15 mg total) by mouth every 6 (six) hours as needed for pain.   Probiotic 250 MG Caps Take 1 capsule by mouth 2 (two) times daily for 14 days.   rOPINIRole 0.5 MG tablet Commonly known as:  REQUIP Take 1 tablet (0.5 mg total) by mouth at  bedtime.   vitamin B-12 1000 MCG tablet Commonly known as:  CYANOCOBALAMIN Take 1,000 mcg by mouth daily.       Procedures/Studies: Dg Tibia/fibula Left  Result Date: 09/28/2018 CLINICAL DATA:  Cellulitis EXAM: LEFT TIBIA AND FIBULA - 2 VIEW COMPARISON:  None. FINDINGS: Diffuse soft tissue swelling  without soft tissue emphysema. No acute fracture or malalignment. No periostitis or bone destruction. IMPRESSION: No acute osseous abnormality Electronically Signed   By: Donavan Foil M.D.   On: 09/28/2018 23:00   US Venous Img Upper Uni Right  Result Date: 10/01/2018 CLINICAL DATA:  Right arm swelling EXAM: RIGHT UPPER EXTREMITY VENOUS DUPLEX ULTRASOUND TECHNIQUE: Doppler venous assessment of the right upper extremity deep venous system was performed, including characterization of spectral flow, compressibility, and phasicity. COMPARISON:  None. FINDINGS: There is complete compressibility of the right jugular, subclavian, axillary, brachial, radial, and ulnar veins. Doppler analysis demonstrates phasicity. There is no evidence of superficial vein thrombosis in the right upper extremity. IMPRESSION: No evidence of DVT in the right upper extremity or right internal jugular vein. Electronically Signed   By: Marybelle Killings M.D.   On: 10/01/2018 10:26   US Arterial Abi (screening Lower Extremity)  Result Date: 09/29/2018 CLINICAL DATA:  Nonhealing left diabetic ulcer, cellulitis. Left lower extremity rest pain and claudication. Diabetes, hyperlipidemia, obesity, previous tobacco abuse. EXAM: NONINVASIVE PHYSIOLOGIC VASCULAR STUDY OF BILATERAL LOWER EXTREMITIES TECHNIQUE: Evaluation of both lower extremities were performed at rest, including calculation of ankle-brachial indices with single level Doppler, pressure and pulse volume recording. COMPARISON:  None. FINDINGS: Right ABI:  1.08 Left ABI:  1.11 Right Lower Extremity:  Monophasic arterial waveforms distally. Left Lower Extremity:  Monophasic arterial waveforms distally. IMPRESSION: Normal bilateral ABIs. Vascular calcification in the setting of diabetes (medial arterial sclerosis of [place]) can result in decreased vascular compressibility, and nondiagnostic ABIs. Should the patient fail conservative treatment, consider CTA runoff (higher spatial  resolution) or MRA runoff (no radiation risk, can be performed noncontrast in the setting of renal dysfunction) to better define the site and nature of arterial occlusive disease and delineate treatment options. Electronically Signed   By: Lucrezia Europe M.D.   On: 09/29/2018 11:03   Dg Chest Port 1 View  Result Date: 09/28/2018 CLINICAL DATA:  fever, chills onset today. Left lower leg pain and redness-surgery a few months ago. History of DM, COPD, tachycardia. EXAM: PORTABLE CHEST 1 VIEW COMPARISON:  06/16/2015 FINDINGS: Cardiac silhouette is normal in size. No mediastinal or hilar masses or evidence of adenopathy. Clear lungs.  No pleural effusion or pneumothorax. Stable changes from a previous anterior cervical spine fusion. Skeletal structures are grossly intact. IMPRESSION: No active disease. Electronically Signed   By: Lajean Manes M.D.   On: 09/28/2018 21:53   Dg Foot Complete Left  Result Date: 09/28/2018 CLINICAL DATA:  Cellulitis EXAM: LEFT FOOT - COMPLETE 3+ VIEW COMPARISON:  03/16/2018, 03/01/2018 FINDINGS: No acute displaced fracture is seen. Amputation of the distal fifth metatarsal with smooth cut margins. Osteopenia at the base of the fifth proximal phalanx with possible loss of cortex on oblique view. Skin thickening and probable ulcer at the level of the amputation. Moderate plantar calcaneal spur. IMPRESSION: 1. Status post amputation of distal fifth metatarsal. Osteopenia with possible subtle cortical loss at the base of the fifth middle phalanx, early changes of osteomyelitis cannot be excluded. 2. Soft tissue ulcer plantar/lateral aspect of the foot at the level of the amputation. Electronically Signed   By: Donavan Foil  M.D.   On: 09/28/2018 23:03      Subjective: Pt says that she feels much better. She has less swelling and discomfort in left leg and has been able to ambulate.  Pt tolerating oral amoxicillin and feels ready to go home today.   Discharge Exam: Vitals:    10/04/18 2132 10/05/18 0531  BP: 135/70 (!) 113/55  Pulse: 76 69  Resp:    Temp: 98.6 F (37 C) 98.4 F (36.9 C)  SpO2: 95% 93%   Vitals:   10/04/18 0547 10/04/18 1534 10/04/18 2132 10/05/18 0531  BP: 107/77 131/81 135/70 (!) 113/55  Pulse: 69 72 76 69  Resp: 18 20    Temp: 98.2 F (36.8 C) 98.4 F (36.9 C) 98.6 F (37 C) 98.4 F (36.9 C)  TempSrc: Oral Oral Oral Oral  SpO2: 100% 97% 95% 93%  Weight:      Height:       General exam: Appears calm and comfortable.   Respiratory system: Clear to auscultation. Respiratory effort normal. Cardiovascular system: S1 & S2 heard, RRR. No JVD, murmurs, rubs, gallops or clicks. No pedal edema. Gastrointestinal system: Abdomen is nondistended, soft and nontender. No organomegaly or masses felt. Normal bowel sounds heard. Central nervous system: Alert and oriented. No focal neurological deficits. Extremities: grip strength is decreased bilaterally, strength is 5/5 in upper arms and shoulder shrugs bilaterally. Pulses intact bilaterally Skin: erythema and edema continues to improve noted over left lower leg and calf but not surpassing the marked lines and there is increased wrinkling of skin suggesting improvement in cellulitis infection, no drainage and no blisters seen.  Infection appears much better and stable for discharge home with outpatient follow up.     Psychiatry: normal affect.    The results of significant diagnostics from this hospitalization (including imaging, microbiology, ancillary and laboratory) are listed below for reference.     Microbiology: Recent Results (from the past 240 hour(s))  Blood Culture (routine x 2)     Status: Abnormal   Collection Time: 09/28/18  9:12 PM  Result Value Ref Range Status   Specimen Description   Final    BLOOD RIGHT ANTECUBITAL Performed at [facility], [place] [address], [place], [place] [address]    Special Requests   Final    BOTTLES DRAWN AEROBIC AND ANAEROBIC Blood  Culture adequate volume Performed at [facility], [address], [place], [place] [address]    Culture  Setup Time   Final    GRAM POSITIVE COCCI Gram Stain Report Called to,Read Back By and Verified With: FOLEY B. @ [id number] ON [id number] BY HENDERSON L. GRAM STAIN REVIEWED-AGREE WITH RESULT CRITICAL RESULT CALLED TO, READ BACK BY AND VERIFIED WITH: RN Marye Round FOLEY [id number] FCP Performed at [facility], [place] [address], [place], [place] [address]    Culture STREPTOCOCCUS GROUP C (A)  Final   Report Status 10/01/2018 FINAL  Final   Organism ID, Bacteria STREPTOCOCCUS GROUP C  Final      Susceptibility   Streptococcus group c - MIC*    CLINDAMYCIN <=0.25 SENSITIVE Sensitive     AMPICILLIN <=0.25 SENSITIVE Sensitive     ERYTHROMYCIN <=0.12 SENSITIVE Sensitive     VANCOMYCIN 0.5 SENSITIVE Sensitive     CEFTRIAXONE <=0.12 SENSITIVE Sensitive     LEVOFLOXACIN 0.5 SENSITIVE Sensitive     PENICILLIN Value in next row Sensitive      SENSITIVE<=0.06    * STREPTOCOCCUS GROUP C  Blood Culture  ID Panel (Reflexed)     Status: Abnormal   Collection Time: 09/28/18  9:12 PM  Result Value Ref Range Status   Enterococcus species NOT DETECTED NOT DETECTED Final   Listeria monocytogenes NOT DETECTED NOT DETECTED Final   Staphylococcus species NOT DETECTED NOT DETECTED Final   Staphylococcus aureus (BCID) NOT DETECTED NOT DETECTED Final   Streptococcus species DETECTED (A) NOT DETECTED Final    Comment: Not Enterococcus species, Streptococcus agalactiae, Streptococcus pyogenes, or Streptococcus pneumoniae. CRITICAL RESULT CALLED TO, READ BACK BY AND VERIFIED WITH: RN [id number] [place] [id number] FCP    Streptococcus agalactiae NOT DETECTED NOT DETECTED Final   Streptococcus pneumoniae NOT DETECTED NOT DETECTED Final   Streptococcus pyogenes NOT DETECTED NOT DETECTED Final   Acinetobacter baumannii NOT DETECTED NOT DETECTED Final   Enterobacteriaceae species NOT DETECTED NOT DETECTED  Final   Enterobacter cloacae complex NOT DETECTED NOT DETECTED Final   Escherichia coli NOT DETECTED NOT DETECTED Final   Klebsiella oxytoca NOT DETECTED NOT DETECTED Final   Klebsiella pneumoniae NOT DETECTED NOT DETECTED Final   Proteus species NOT DETECTED NOT DETECTED Final   Serratia marcescens NOT DETECTED NOT DETECTED Final   Haemophilus influenzae NOT DETECTED NOT DETECTED Final   Neisseria meningitidis NOT DETECTED NOT DETECTED Final   Pseudomonas aeruginosa NOT DETECTED NOT DETECTED Final   Candida albicans NOT DETECTED NOT DETECTED Final   Candida glabrata NOT DETECTED NOT DETECTED Final   Candida krusei NOT DETECTED NOT DETECTED Final   Candida parapsilosis NOT DETECTED NOT DETECTED Final   Candida tropicalis NOT DETECTED NOT DETECTED Final    Comment: Performed at [facility], [place] [address] [place], [place] [address]  Blood Culture (routine x 2)     Status: None   Collection Time: 09/28/18  9:40 PM  Result Value Ref Range Status   Specimen Description BLOOD LEFT FOREARM  Final   Special Requests   Final    BOTTLES DRAWN AEROBIC AND ANAEROBIC Blood Culture adequate volume   Culture   Final    NO GROWTH 5 DAYS Performed at [facility], [address], [place], [place] [address]    Report Status 10/03/2018 FINAL  Final  Urine culture     Status: None   Collection Time: 09/28/18 11:18 PM  Result Value Ref Range Status   Specimen Description   Final    URINE, CLEAN CATCH Performed at [facility], [address] [place], [place] [address]    Special Requests   Final    NONE Performed at [facility], [address], [place], [place] [address]    Culture   Final    NO GROWTH Performed at [facility], [place] [address], [place],  [address]    Report Status 09/30/2018 FINAL  Final     Labs: BNP (last 3 results) Recent Labs    09/28/18 2112  BNP 33.8   Basic Metabolic Panel: Recent Labs  Lab 09/28/18 2112 09/29/18 0656 09/30/18 1144  10/01/18 0503 10/02/18 0415 10/03/18 0532 10/04/18 0451  NA 131* 135  --  135 136  --  139  K 4.6 4.1  --  4.5 4.7  --  4.4  CL 93* 102  --  101 101  --  100  CO2 27 25  --  27 27  --  31  GLUCOSE 231* 211*  --  184* 141*  --  132*  BUN 12 13  --  24* 29*  --  25*  CREATININE 1.10* 0.94  --  1.06* 1.09* 1.13* 0.92  CALCIUM 9.1 7.9*  --  8.4* 8.3*  --  8.9  MG  --   --  2.1  --  2.5*  --   --   PHOS  --   --  3.1  --   --   --   --    Liver Function Tests: Recent Labs  Lab 09/28/18 2112 10/02/18 0415  AST 16 15  ALT 15 11  ALKPHOS 106 77  BILITOT 0.5 0.4  PROT 9.2* 7.3  ALBUMIN 4.3 3.1*   No results for input(s): LIPASE, AMYLASE in the last 168 hours. Recent Labs  Lab 09/30/18 1144  AMMONIA 18   CBC: Recent Labs  Lab 09/28/18 2112 09/29/18 0656 10/01/18 0503 10/02/18 0415 10/04/18 0451  WBC 19.3* 14.4* 10.3 8.6 9.9  NEUTROABS 17.1* 12.3*  --  4.6 6.1  HGB 14.2 12.2 11.4* 11.9* 12.8  HCT 45.3 39.4 37.2 39.7 41.5  MCV 88.0 90.6 91.0 91.7 89.8  PLT 342 240 232 267 309   Cardiac Enzymes: No results for input(s): CKTOTAL, CKMB, CKMBINDEX, TROPONINI in the last 168 hours. BNP: Invalid input(s): POCBNP CBG: Recent Labs  Lab 10/04/18 1131 10/04/18 1659 10/04/18 2212 10/05/18 0406 10/05/18 0723  GLUCAP 81 278* 105* 129* 145*   D-Dimer No results for input(s): DDIMER in the last 72 hours. Hgb A1c No results for input(s): HGBA1C in the last 72 hours. Lipid Profile No results for input(s): CHOL, HDL, LDLCALC, TRIG, CHOLHDL, LDLDIRECT in the last 72 hours. Thyroid function studies No results for input(s): TSH, T4TOTAL, T3FREE, THYROIDAB in the last 72 hours.  Invalid input(s): FREET3 Anemia work up No results for input(s): VITAMINB12, FOLATE, FERRITIN, TIBC, IRON, RETICCTPCT in the last 72 hours. Urinalysis    Component Value Date/Time   COLORURINE YELLOW 09/28/2018 2318   APPEARANCEUR CLEAR 09/28/2018 2318   LABSPEC 1.009 09/28/2018 2318   PHURINE  7.0 09/28/2018 2318   GLUCOSEU NEGATIVE 09/28/2018 2318   GLUCOSEU 250 (A) 01/21/2014 1159   HGBUR NEGATIVE 09/28/2018 2318   [place] NEGATIVE 09/28/2018 2318   KETONESUR NEGATIVE 09/28/2018 2318   PROTEINUR NEGATIVE 09/28/2018 2318   UROBILINOGEN 1.0 01/21/2014 1159   NITRITE NEGATIVE 09/28/2018 2318   LEUKOCYTESUR NEGATIVE 09/28/2018 2318   Sepsis Labs Invalid input(s): PROCALCITONIN,  WBC,  LACTICIDVEN Microbiology Recent Results (from the past 240 hour(s))  Blood Culture (routine x 2)     Status: Abnormal   Collection Time: 09/28/18  9:12 PM  Result Value Ref Range Status   Specimen Description   Final    BLOOD RIGHT ANTECUBITAL Performed at [facility], [place] [address], [place], [place] [address]    Special Requests   Final    BOTTLES DRAWN AEROBIC AND ANAEROBIC Blood Culture adequate volume Performed at [facility], [address], [place], [place] [address]    Culture  Setup Time   Final    GRAM POSITIVE COCCI Gram Stain Report Called to,Read Back By and Verified With: FOLEY B. @ [id number] ON [id number] BY HENDERSON L. GRAM STAIN REVIEWED-AGREE WITH RESULT CRITICAL RESULT CALLED TO, READ BACK BY AND VERIFIED WITH: RN Marye Round FOLEY [id number] FCP Performed at [facility], [place] [address] [place],  [address]    Culture STREPTOCOCCUS GROUP C (A)  Final   Report Status 10/01/2018 FINAL  Final   Organism ID, Bacteria STREPTOCOCCUS GROUP C  Final      Susceptibility  Streptococcus group c - MIC*    CLINDAMYCIN <=0.25 SENSITIVE Sensitive     AMPICILLIN <=0.25 SENSITIVE Sensitive     ERYTHROMYCIN <=0.12 SENSITIVE Sensitive     VANCOMYCIN 0.5 SENSITIVE Sensitive     CEFTRIAXONE <=0.12 SENSITIVE Sensitive     LEVOFLOXACIN 0.5 SENSITIVE Sensitive     PENICILLIN Value in next row Sensitive      SENSITIVE<=0.06    * STREPTOCOCCUS GROUP C  Blood Culture ID Panel (Reflexed)     Status: Abnormal   Collection Time: 09/28/18  9:12 PM  Result Value Ref Range  Status   Enterococcus species NOT DETECTED NOT DETECTED Final   Listeria monocytogenes NOT DETECTED NOT DETECTED Final   Staphylococcus species NOT DETECTED NOT DETECTED Final   Staphylococcus aureus (BCID) NOT DETECTED NOT DETECTED Final   Streptococcus species DETECTED (A) NOT DETECTED Final    Comment: Not Enterococcus species, Streptococcus agalactiae, Streptococcus pyogenes, or Streptococcus pneumoniae. CRITICAL RESULT CALLED TO, READ BACK BY AND VERIFIED WITH: RN [id number] [place] [phone] [id number] FCP    Streptococcus agalactiae NOT DETECTED NOT DETECTED Final   Streptococcus pneumoniae NOT DETECTED NOT DETECTED Final   Streptococcus pyogenes NOT DETECTED NOT DETECTED Final   Acinetobacter baumannii NOT DETECTED NOT DETECTED Final   Enterobacteriaceae species NOT DETECTED NOT DETECTED Final   Enterobacter cloacae complex NOT DETECTED NOT DETECTED Final   Escherichia coli NOT DETECTED NOT DETECTED Final   Klebsiella oxytoca NOT DETECTED NOT DETECTED Final   Klebsiella pneumoniae NOT DETECTED NOT DETECTED Final   Proteus species NOT DETECTED NOT DETECTED Final   Serratia marcescens NOT DETECTED NOT DETECTED Final   Haemophilus influenzae NOT DETECTED NOT DETECTED Final   Neisseria meningitidis NOT DETECTED NOT DETECTED Final   Pseudomonas aeruginosa NOT DETECTED NOT DETECTED Final   Candida albicans NOT DETECTED NOT DETECTED Final   Candida glabrata NOT DETECTED NOT DETECTED Final   Candida krusei NOT DETECTED NOT DETECTED Final   Candida parapsilosis NOT DETECTED NOT DETECTED Final   Candida tropicalis NOT DETECTED NOT DETECTED Final    Comment: Performed at [facility], [place] [address] [place], [place] [address]  Blood Culture (routine x 2)     Status: None   Collection Time: 09/28/18  9:40 PM  Result Value Ref Range Status   Specimen Description BLOOD LEFT FOREARM  Final   Special Requests   Final    BOTTLES DRAWN AEROBIC AND ANAEROBIC Blood Culture adequate volume    Culture   Final    NO GROWTH 5 DAYS Performed at [facility], [address] [place], [place] [address]    Report Status 10/03/2018 FINAL  Final  Urine culture     Status: None   Collection Time: 09/28/18 11:18 PM  Result Value Ref Range Status   Specimen Description   Final    URINE, CLEAN CATCH Performed at [facility], [address] [place], [place] [address]    Special Requests   Final    NONE Performed at [facility], [address] [place], [place] [address]    Culture   Final    NO GROWTH Performed at [facility], [place] [address], [place],  [address]    Report Status 09/30/2018 FINAL  Final   Time coordinating discharge: 35 minutes  SIGNED:  Irwin Brakeman, MD  [facility] 10/05/2018, 10:36 AM Pager [phone]  If 7PM-7AM, please contact night-coverage [email or web address] Password [email or web address]

## 2018-10-08 NOTE — Telephone Encounter (Signed)
Transition Care Management Follow-up Telephone Call   Date discharged? 10/05/18   How have you been since you were released from the hospital? Pt states she is doing ok   Do you understand why you were in the hospital? YES   Do you understand the discharge instructions? YES   Where were you discharged to? Home   Items Reviewed:  Medications reviewed: YES  Allergies reviewed: YES  Dietary changes reviewed: NO  Referrals reviewed: YES, have appt schedule w/neurology this Wednesday    Functional Questionnaire:   Activities of Daily Living (ADLs):   She states she are independent in the following: ambulation, bathing and hygiene, feeding, continence, grooming, toileting and dressing States she doesn't require assistance    Any transportation issues/concerns?: NO   Any patient concerns? NO   Confirmed importance and date/time of follow-up visits scheduled YES, appt 10/17/18  Provider Appointment booked with Dr. Alain Marion  Confirmed with patient if condition begins to worsen call PCP or go to the ER.  Patient was given the office number and encouraged to call back with question or concerns.  : YES

## 2018-10-11 ENCOUNTER — Ambulatory Visit (INDEPENDENT_AMBULATORY_CARE_PROVIDER_SITE_OTHER): Payer: Medicare Other | Admitting: Podiatry

## 2018-10-11 ENCOUNTER — Encounter

## 2018-10-11 DIAGNOSIS — Z9889 Other specified postprocedural states: Secondary | ICD-10-CM | POA: Diagnosis not present

## 2018-10-11 DIAGNOSIS — E1142 Type 2 diabetes mellitus with diabetic polyneuropathy: Secondary | ICD-10-CM | POA: Diagnosis not present

## 2018-10-11 DIAGNOSIS — Q6671 Congenital pes cavus, right foot: Secondary | ICD-10-CM

## 2018-10-11 DIAGNOSIS — L97429 Non-pressure chronic ulcer of left heel and midfoot with unspecified severity: Secondary | ICD-10-CM | POA: Diagnosis not present

## 2018-10-11 DIAGNOSIS — E11621 Type 2 diabetes mellitus with foot ulcer: Secondary | ICD-10-CM

## 2018-10-17 ENCOUNTER — Encounter: Payer: Self-pay | Admitting: Internal Medicine

## 2018-10-17 ENCOUNTER — Ambulatory Visit (INDEPENDENT_AMBULATORY_CARE_PROVIDER_SITE_OTHER): Payer: Medicare Other | Admitting: Internal Medicine

## 2018-10-17 VITALS — BP 122/68 | HR 81 | Temp 98.1°F | Ht 69.0 in | Wt 223.0 lb

## 2018-10-17 DIAGNOSIS — E1140 Type 2 diabetes mellitus with diabetic neuropathy, unspecified: Secondary | ICD-10-CM | POA: Diagnosis not present

## 2018-10-17 DIAGNOSIS — M5440 Lumbago with sciatica, unspecified side: Secondary | ICD-10-CM

## 2018-10-17 DIAGNOSIS — L03119 Cellulitis of unspecified part of limb: Secondary | ICD-10-CM

## 2018-10-17 DIAGNOSIS — L03116 Cellulitis of left lower limb: Secondary | ICD-10-CM | POA: Diagnosis not present

## 2018-10-17 DIAGNOSIS — E538 Deficiency of other specified B group vitamins: Secondary | ICD-10-CM

## 2018-10-17 DIAGNOSIS — L02419 Cutaneous abscess of limb, unspecified: Secondary | ICD-10-CM

## 2018-10-17 MED ORDER — RESTORA PO CAPS: ORAL_CAPSULE | ORAL | 1 refills | Status: DC

## 2018-10-17 MED ORDER — AMOXICILLIN-POT CLAVULANATE 875-125 MG PO TABS: 1.0000 | ORAL_TABLET | Freq: Two times a day (BID) | ORAL | 0 refills | Status: DC

## 2018-10-17 MED ORDER — FLUCONAZOLE 150 MG PO TABS: 150.0000 mg | ORAL_TABLET | Freq: Once | ORAL | 1 refills | Status: AC

## 2018-10-17 NOTE — Assessment & Plan Note (Signed)
70/30 insulin 

## 2018-10-17 NOTE — Assessment & Plan Note (Signed)
Re-start B12 

## 2018-10-17 NOTE — Assessment & Plan Note (Signed)
Chronic OA On Oxycodone 15 mg  Potential benefits of a long term opioids use as well as potential risks (i.e. addiction risk, apnea etc) and complications (i.e. Somnolence, constipation and others) were explained to the patient and were aknowledged. Risks of use w/benzodiazepines discussed  Do not take w/Clonazepam 

## 2018-10-17 NOTE — Assessment & Plan Note (Addendum)
12/19 Augmentin Probiotic

## 2018-10-17 NOTE — Progress Notes (Signed)
Subjective:  Patient ID: Katherine Herrera, female    DOB: February 19, 1966  Age: 52 y.o. MRN: [id number]  CC: No chief complaint on file.   HPI MONEISHA VOSLER presents for L foot/leg cellulitis: heat and redness are spreading on the L distal shin; pain. Hosp d/c reviewed - 10/05/18. F/u DM, LBP, Streptococcus sepsis  Per hx: "Brief Hospitalization Summary: Please see all hospital notes, images, labs for full details of the hospitalization. RSW:NIOEV M McGowanis a 52 y.o.femalewith medical history significant forinsulin-dependent diabetes mellitus, hypertension, hypothyroidism, chronic pain, and diabetic ulcers involving bilateral feet status post resection of the distal fifth metatarsal on the left, now presenting to the emergency department for evaluation of fevers, chills, and left ankle pain, swelling, and redness. Patient reports experiencing some shooting pains from the left foot over the past couple days, did not think much of it at the time, but developed fevers and chills today with worsening pain in the left foot and ankle, and noticed new redness and swelling involving the lower left leg. There has not been any drainage from her ulcers. She denies any chest pain, palpitations, shortness of breath, or cough. She denies dysuria or flank pain. She has not been on antibiotics recently.  ED Course:Upon arrival to the ED, patient is found to be febrile to 39.1C,saturating well on room air, tachycardic in the 130s, and with stable blood pressure. EKG features sinus tachycardia with rate 119 and repolarization abnormality. Chest x-ray is negative for acute cardiopulmonary disease. Chemistry panel is notable for sodium of 131, glucose 231, and creatinine 1.10, slightly higher than in September. CBC features a leukocytosis to 19,300. Lactic acid is elevated to 2.2. Radiographs of the left foot and lower leg are notable for prior resection of the distal fifth metatarsal on the left and  possible subtle cortical loss at the base of the fifth middle phalanx, unable to exclude early osteomyelitis. Blood cultures were collected, 1 L of normal saline given, pain medications administered, and the patient was treated with empiric vancomycin, cefepime, and Flagyl in the ED. Heart rate improved, blood pressure remained stable, and the patient will be admitted for ongoing evaluation and management.  Brief Narrative: 52 year old female with a history of insulin-dependent diabetes, hypertension, hypothyroidism, chronic pain syndrome, diabetic neuropathy in her feet, recent resection of her distal fifth metatarsal on the left, presented to the emergency room with fevers, chills and ankle pain. She was found to have swelling and redness in her left lower extremity consistent with a cellulitis. She was admitted for further treatments. She also complains of numbness/tingling in her hands bilaterally with occasional jerking for the past few days.  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 Sepsis. Resolved now. Secondary to left leg cellulitis. Blood cultures show positive for Streptococcus. Continue on ceftriaxone as highly sensitive to ceftriaxone per I&D and sensitivities. Hemodynamics are stable. 2. Streptococcus bacteremia- pansensitive, treating with IV ceftriaxone then de-escalated to amoxicillin to complete full 10 days of therapy.  3. Cellulitis of left lower leg - Improving. Treated with intravenous ceftriaxone, keep elevated. Transitionedto oral amoxicillin to complete full 10 day course.  ABIs done  bilaterally are greater than 1. X-ray of left tibia/fibula does not  show any deep infection. Cellulitis did not appear to extend down to her foot. She does have a callus on the plantar aspect of her foot, but this does not appear to be actively infected. Only chronic changes noted in left foot. 4. Right hand paresthesias/numbness - Pt has history of bilateral carpal tunnel syndrome s/p surgical correction. Offered MRI but patient says that she could not tolerate MRI today even with sedation. Appreciate neurology consultation. EEG completed with normal results. This likely is a manifestation of diabetic neuropathy. Outpatient neurologyfollow up recommended.  5. Opioid constipation - Mag citrate, SMOG enema ordered, continue scheduled miralax, peri-colace.SMOG enema this morning. Pt has had a bowel movement after the enema was given.  6. Generalized Anxiety Disorder. Continue on home meds.  7. Chronic pain syndrome. Continue on oxycodone, Neurontin and baclofen plus laxatives.  8. Hypothyroidism. Continue Synthroid 9. Uncontrolled type 2 insulin requiring Diabetes Mellitus -bettercontrolledafter increasing70/30 insulin to 60 units twice daily with meals. CBGs to 5x per day. Monitor for hypoglycemia. "    Outpatient Medications Prior to Visit  Medication Sig Dispense Refill  . aspirin (BAYER ASPIRIN) 325 MG tablet Take 1 tablet (325 mg total) by mouth daily. 100 tablet 3  . B-D INS SYR ULTRAFINE 1CC/31G 31G X 5/16" 1 ML MISC USE AS DIRECTED 100 each 0  . baclofen (LIORESAL) 10 MG tablet Take 2 tablets (20 mg total) by mouth 2 (two) times daily. 180 tablet 1  . Cholecalciferol (VITAMIN D3) 1000 UNIT tablet Take 1,000 Units by mouth daily.     . clonazePAM (KLONOPIN) 0.5 MG tablet Take 1 tablet (0.5 mg total) by mouth 2 (two) times daily as needed for anxiety.    . docusate sodium (COLACE) 100 MG capsule Take 200 mg by mouth every evening.    . ezetimibe (ZETIA) 10 MG tablet  TAKE ONE TABLET BY MOUTH EVERY DAY (Patient taking differently: Take 10 mg by mouth daily. ) 30 tablet 11  . gabapentin (NEURONTIN) 800 MG tablet Take 1 tablet (800 mg total) by mouth 4 (four) times daily.    Marland Kitchen glucose blood (ONETOUCH VERIO) test strip Use to check blood sugar 2 times per day. 100 each 12  . insulin NPH-regular Human (NOVOLIN 70/30) (70-30) 100 UNIT/ML injection Inject 60 Units into the skin 2 (two) times daily with a meal.    . Insulin Syringe-Needle U-100 (SURE COMFORT INSULIN SYRINGE) 31G X 5/16" 0.5 ML MISC USE AS DIRECTED WITH INSULIN 4 TIMES A DAY 400 each 2  . lamoTRIgine (LAMICTAL) 25 MG tablet TAKE 1 TABLET BY MOUTH TWO  TIMES DAILY 180 tablet 1  . Lancet Devices (ACCU-CHEK SOFTCLIX) lancets Test two times daily 100 each 12  . levothyroxine (SYNTHROID, LEVOTHROID) 75 MCG tablet TAKE 1 TABLET BY MOUTH  DAILY 90 tablet 3  . lisinopril (PRINIVIL,ZESTRIL) 10 MG tablet TAKE ONE TABLET BY MOUTH EVERY DAY (Patient taking differently: Take 10 mg by mouth daily. ) 30 tablet 11  . loratadine (CLARITIN) 10 MG tablet Take 10 mg by mouth daily as needed for allergies.    Glory Rosebush DELICA LANCETS 86V MISC TEST TWO TIMES DAILY 200 each 2  . oxyCODONE (ROXICODONE) 15 MG immediate release tablet Take 1 tablet (15 mg total) by mouth every 6 (six) hours as needed for pain. 120 tablet 0  . rOPINIRole (REQUIP) 0.5 MG tablet Take 1 tablet (0.5 mg total) by mouth at bedtime.    . vitamin B-12 (CYANOCOBALAMIN) 1000 MCG tablet Take 1,000 mcg by  mouth daily.      . Saccharomyces boulardii (PROBIOTIC) 250 MG CAPS Take 1 capsule by mouth 2 (two) times daily for 14 days.     No facility-administered medications prior to visit.     ROS: Review of Systems  Objective:  BP 122/68 (BP Location: Left Arm, Patient Position: Sitting, Cuff Size: Large)   Pulse 81   Temp 98.1 F (36.7 C) (Oral)   Ht 5\' 9"  (1.753 m)   Wt 223 lb (101.2 kg)   SpO2 98%   BMI 32.93 kg/m   BP Readings from Last 3  Encounters:  10/17/18 122/68  10/05/18 (!) 113/55  07/30/18 126/72    Wt Readings from Last 3 Encounters:  10/17/18 223 lb (101.2 kg)  09/28/18 260 lb (117.9 kg)  07/30/18 225 lb (102.1 kg)    Physical Exam L foot wound is dressed L foot/leg cellulitis: heat and redness are spreading on the L distal shin; pain on palpation. Lab Results  Component Value Date   WBC 9.9 10/04/2018   HGB 12.8 10/04/2018   HCT 41.5 10/04/2018   PLT 309 10/04/2018   GLUCOSE 132 (H) 10/04/2018   CHOL 223 (H) 07/19/2016   TRIG (H) 07/19/2016    527.0 Triglyceride is over 400; calculations on Lipids are invalid.   HDL 27.40 (L) 07/19/2016   LDLDIRECT 106.0 07/19/2016   LDLCALC 81 01/02/2013   ALT 11 10/02/2018   AST 15 10/02/2018   NA 139 10/04/2018   K 4.4 10/04/2018   CL 100 10/04/2018   CREATININE 0.92 10/04/2018   BUN 25 (H) 10/04/2018   CO2 31 10/04/2018   TSH 1.151 09/30/2018   INR 0.95 01/28/2014   HGBA1C 8.5 (H) 09/29/2018   MICROALBUR 0.9 07/19/2016    No results found.  Assessment & Plan:   There are no diagnoses linked to this encounter.   No orders of the defined types were placed in this encounter.    Follow-up: No follow-ups on file.  Walker Kehr, MD

## 2018-10-17 NOTE — Assessment & Plan Note (Addendum)
Augmentin Restora Diflucan

## 2018-10-18 ENCOUNTER — Other Ambulatory Visit: Payer: Self-pay | Admitting: Internal Medicine

## 2018-10-18 NOTE — Telephone Encounter (Signed)
Copied from [place] #[id number]. Topic: Quick Communication - Rx Refill/Question >> Oct 18, 2018 11:56 AM Percell Belt A wrote: Medication:  levothyroxine (SYNTHROID, LEVOTHROID) 75 MCG tablet [[id number]] ezetimibe (ZETIA) 10 MG tablet [[id number]] rOPINIRole (REQUIP) 0.5 MG tablet [[id number]]  lisinopril (PRINIVIL,ZESTRIL) 10 MG tablet [[id number]]  Pt stated that that she needs refill on these 3 meds and needs [facility]   Has the patient contacted their pharmacy?  (Agent: If no, request that the patient contact the pharmacy for the refill.) (Agent: If yes, when and what did the pharmacy advise?)  Preferred Pharmacy (with phone number or street name): [place], [place] [employer] [phone] (Phone)   Agent: Please be advised that RX refills may take up to 3 business days. We ask that you follow-up with your pharmacy.

## 2018-10-18 NOTE — Telephone Encounter (Signed)
[facility] called and spoke to [facility], Education administrator who confirmed receipt of Levothyroxine 75 mcg on 10/03/18 #90/3 refills, she says it is too early to refill and will be filled on 10/29/18. I advised it was a request sent, she says she will cancel that request on her end, request refused.

## 2018-10-18 NOTE — Telephone Encounter (Signed)
Requested medication (s) are due for refill today: Yes  Requested medication (s) are on the active medication list: Yes  Last refill:  Zetia 06/13/18 (local pharmacy); Requip (print Rx by hospitalist on 10/05/18); Lisinopril 09/17/18  Future visit scheduled: Yes  Notes to clinic:  Lipid labs failed; All to go to mail order pharmacy    Requested Prescriptions  Pending Prescriptions Disp Refills   ezetimibe (ZETIA) 10 MG tablet 30 tablet 11    Sig: Take 1 tablet (10 mg total) by mouth daily.     Cardiovascular:  Antilipid - Sterol Transport Inhibitors Failed - 10/18/2018  2:36 PM      Failed - Total Cholesterol in normal range and within 360 days    Cholesterol  Date Value Ref Range Status  07/19/2016 223 (H) 0 - 200 mg/dL Final    Comment:    ATP III Classification       Desirable:  < 200 mg/dL               Borderline High:  200 - 239 mg/dL          High:  > = 240 mg/dL         Failed - LDL in normal range and within 360 days    LDL Cholesterol  Date Value Ref Range Status  01/02/2013 81 0 - 99 mg/dL Final         Failed - HDL in normal range and within 360 days    HDL  Date Value Ref Range Status  07/19/2016 27.40 (L) >39.00 mg/dL Final         Failed - Triglycerides in normal range and within 360 days    Triglycerides  Date Value Ref Range Status  07/19/2016 (H) 0.0 - 149.0 mg/dL Final   527.0 Triglyceride is over 400; calculations on Lipids are invalid.    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 ([place])   [facility] Plot[facility], [name], MD   2 months ago Syncope, unspecified syncope type   [place], [name], MD   5 months ago Low back pain with sciatica, sciatica laterality unspecified, unspecified back pain laterality, unspecified chronicity   [place], [name], MD   9 months ago Cutaneous vasculitis   [place] Plotnikov, [name], MD   1 year ago Cutaneous vasculitis   [place], MD      Future Appointments            In 1 week Plotnikov, [name], MD [place]          rOPINIRole (REQUIP) 0.5 MG tablet      Sig: Take 1 tablet (0.5 mg total) by mouth at bedtime.     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 ([place])   [facility], [name], MD   2 months ago Syncope, unspecified syncope type   [place], [name], MD   5 months ago Low back pain with sciatica, sciatica laterality unspecified, unspecified back pain laterality, unspecified chronicity   [place] Plotnikov, [name], MD   9 months ago Cutaneous vasculitis   [place] Plotnikov, [name], MD   1 year ago Cutaneous vasculitis   [place], MD      Future Appointments            In 1 week Plotnikov, [name], MD [place]          lisinopril (PRINIVIL,ZESTRIL) 10 MG tablet 30 tablet 11    Sig: Take 1 tablet (10 mg total) by mouth daily.     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 ([place])   [facility], [name], MD   2 months ago Syncope, unspecified syncope type   [place], [name], MD   5 months ago Low back pain with sciatica, sciatica laterality unspecified, unspecified back pain laterality, unspecified chronicity   [place] Plotnikov, [name], MD   9 months ago Cutaneous vasculitis   [place] Plotnikov, [name], MD   1 year ago Cutaneous vasculitis   [place] Plotnikov, [name], MD      Future Appointments            In 1 week Plotnikov, [name], MD [place]         Refused Prescriptions Disp Refills   levothyroxine (SYNTHROID, LEVOTHROID) 75 MCG tablet 90 tablet 3    Sig: Take 1 tablet (75 mcg total) by mouth daily.     Endocrinology:  Hypothyroid Agents Failed - 10/18/2018  2:36 PM      Failed - TSH needs to be rechecked within 3 months after an abnormal result. Refill until TSH is due.      Passed - TSH in normal range and within 360 days    TSH  Date Value Ref Range Status  09/30/2018 1.151 0.350 - 4.500  uIU/mL Final    Comment:    Performed by a 3rd Generation assay with a functional sensitivity of <=0.01 uIU/mL. 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

## 2018-10-19 MED ORDER — ROPINIROLE HCL 0.5 MG PO TABS: 0.5000 mg | ORAL_TABLET | Freq: Every day | ORAL | 1 refills | Status: DC

## 2018-10-19 MED ORDER — EZETIMIBE 10 MG PO TABS: 10.0000 mg | ORAL_TABLET | Freq: Every day | ORAL | 1 refills | Status: DC

## 2018-10-19 MED ORDER — LISINOPRIL 10 MG PO TABS: 10.0000 mg | ORAL_TABLET | Freq: Every day | ORAL | 1 refills | Status: DC

## 2018-10-25 ENCOUNTER — Ambulatory Visit: Payer: Medicare Other | Admitting: Podiatry

## 2018-10-25 ENCOUNTER — Encounter: Payer: Self-pay | Admitting: Podiatry

## 2018-10-25 DIAGNOSIS — M21961 Unspecified acquired deformity of right lower leg: Secondary | ICD-10-CM | POA: Diagnosis not present

## 2018-10-25 DIAGNOSIS — L97429 Non-pressure chronic ulcer of left heel and midfoot with unspecified severity: Secondary | ICD-10-CM

## 2018-10-25 DIAGNOSIS — E1142 Type 2 diabetes mellitus with diabetic polyneuropathy: Secondary | ICD-10-CM | POA: Diagnosis not present

## 2018-10-25 DIAGNOSIS — M898X7 Other specified disorders of bone, ankle and foot: Secondary | ICD-10-CM | POA: Diagnosis not present

## 2018-10-25 DIAGNOSIS — E11621 Type 2 diabetes mellitus with foot ulcer: Secondary | ICD-10-CM | POA: Diagnosis not present

## 2018-10-25 DIAGNOSIS — M7751 Other enthesopathy of right foot: Secondary | ICD-10-CM | POA: Diagnosis not present

## 2018-10-25 NOTE — Patient Instructions (Signed)
Pre-Operative Instructions  Congratulations, you have decided to take an important step towards improving your quality of life.  You can be assured that the doctors and staff at [facility] will be with you every step of the way.  Here are some important things you should know:  1. Plan to be at the surgery center/hospital at least 1 (one) hour prior to your scheduled time, unless otherwise directed by the surgical center/hospital staff.  You must have a responsible adult accompany you, remain during the surgery and drive you home.  Make sure you have directions to the surgical center/hospital to ensure you arrive on time. 2. If you are having surgery at [facility] or [facility], you will need a copy of your medical history and physical form from your family physician within one month prior to the date of surgery. We will give you a form for your primary physician to complete.  3. We make every effort to accommodate the date you request for surgery.  However, there are times where surgery dates or times have to be moved.  We will contact you as soon as possible if a change in schedule is required.   4. No aspirin/ibuprofen for one week before surgery.  If you are on aspirin, any non-steroidal anti-inflammatory medications (Mobic, Aleve, Ibuprofen) should not be taken seven (7) days prior to your surgery.  You make take Tylenol for pain prior to surgery.  5. Medications - If you are taking daily heart and blood pressure medications, seizure, reflux, allergy, asthma, anxiety, pain or diabetes medications, make sure you notify the surgery center/hospital before the day of surgery so they can tell you which medications you should take or avoid the day of surgery. 6. No food or drink after midnight the night before surgery unless directed otherwise by surgical center/hospital staff. 7. No alcoholic beverages 24-hours prior to surgery.  No smoking 24-hours prior or 24-hours after  surgery. 8. Wear loose pants or shorts. They should be loose enough to fit over bandages, boots, and casts. 9. Don't wear slip-on shoes. Sneakers are preferred. 10. Bring your boot with you to the surgery center/hospital.  Also bring crutches or a walker if your physician has prescribed it for you.  If you do not have this equipment, it will be provided for you after surgery. 11. If you have not been contacted by the surgery center/hospital by the day before your surgery, call to confirm the date and time of your surgery. 12. Leave-time from work may vary depending on the type of surgery you have.  Appropriate arrangements should be made prior to surgery with your employer. 13. Prescriptions will be provided immediately following surgery by your doctor.  Fill these as soon as possible after surgery and take the medication as directed. Pain medications will not be refilled on weekends and must be approved by the doctor. 14. Remove nail polish on the operative foot and avoid getting pedicures prior to surgery. 15. Wash the night before surgery.  The night before surgery wash the foot and leg well with water and the antibacterial soap provided. Be sure to pay special attention to beneath the toenails and in between the toes.  Wash for at least three (3) minutes. Rinse thoroughly with water and dry well with a towel.  Perform this wash unless told not to do so by your physician.  Enclosed: 1 Ice pack (please put in freezer the night before surgery)   1 Hibiclens skin cleaner     Pre-op instructions  If you have any questions regarding the instructions, please do not hesitate to call our office.  [place]: [address], [place], [place] [address] -- [phone]  [place]: [address] [place], [place] [address] -- [phone]  [place]: [address]  [place], [place] [address] -- [phone]  [place]: [address], [address], [place], [place] [address] -- [phone]  Website:  [email or web address] 

## 2018-10-25 NOTE — Progress Notes (Signed)
  Subjective:  Patient ID: Katherine Herrera, female    DOB: 06-Sep-1966,  MRN: [id number]  Chief Complaint  Patient presents with  . Foot Ulcer    left foot follow up; pt stated, "feet are painful today"   52 y.o. female returns for post-op check. Denies N/V/F/Ch.  States that the feet are very painful today but the ulcers are about the same.  Objective:   General AA&O x3. Normal mood and affect.  Vascular Foot warm and well perfused.  Neurologic Gross sensation diminished.  Dermatologic Ulcer present to the left fifth metatarsal head measuring one by one post-debridement Pre-ulcerative lesions about the right fifth metatarsal head, fifth metatarsal base  Orthopedic: Tenderness to palpation noted about the fifth metatarsal head area bilaterally, fifth met base right    Assessment & Plan:  Patient was evaluated and treated and all questions answered.  Left foot wound sub-met fifth metatarsal -Discussed resecting more bone to prevent recurrence of the ulceration.  Patient verbalized understanding wishes to proceed would consider concomitant wound closure at that time and possible adjacent tissue transfer.  Procedure: Excisional Debridement of Wound Rationale: Removal of non-viable soft tissue from the wound to promote healing.  Anesthesia: none Pre-Debridement Wound Measurements: 0.5 cm x 0.5 cm x 0.1 cm  Post-Debridement Wound Measurements: 1 cm x 1 cm x 0.1 cm  Type of Debridement: Sharp Excisional Tissue Removed: Non-viable soft tissue Depth of Debridement: subcutaneous tissue. Technique: Sharp excisional debridement to bleeding, viable wound base.  Dressing: Dry, sterile, compression dressing. Disposition: Patient tolerated procedure well. Patient to return in 1 week for follow-up.  Right fifth metatarsal head ulceration -Discussed surgical excision of the fifth metatarsal head.  Patient verbalized understanding wishes to proceed  Right fifth metatarsal styloid process  pre-ulcerative lesion with cavus foot type. -Continue AFO brace -Would consider fifth metatarsal base exostectomy at time of surgery  -Patient has failed all conservative therapy and wishes to proceed with surgical intervention. All risks, benefits, and alternatives discussed with patient. No guarantees given. Consent reviewed and signed by patient. -Planned procedures: Left fifth metatarsal resection, right fifth metatarsal head excision, fifth metatarsal base exostectomy (possible)  20 minutes of face to face time were spent with the patient. >50% of this was spent on counseling and coordination of care. Specifically discussed with patient the above diagnoses and overall treatment plan.   Return for post op.

## 2018-10-27 ENCOUNTER — Encounter: Payer: Self-pay | Admitting: Internal Medicine

## 2018-10-29 ENCOUNTER — Ambulatory Visit (INDEPENDENT_AMBULATORY_CARE_PROVIDER_SITE_OTHER): Payer: Medicare Other | Admitting: Internal Medicine

## 2018-10-29 ENCOUNTER — Encounter: Payer: Self-pay | Admitting: Internal Medicine

## 2018-10-29 DIAGNOSIS — R55 Syncope and collapse: Secondary | ICD-10-CM | POA: Diagnosis not present

## 2018-10-29 DIAGNOSIS — E034 Atrophy of thyroid (acquired): Secondary | ICD-10-CM

## 2018-10-29 DIAGNOSIS — E08621 Diabetes mellitus due to underlying condition with foot ulcer: Secondary | ICD-10-CM

## 2018-10-29 DIAGNOSIS — L97512 Non-pressure chronic ulcer of other part of right foot with fat layer exposed: Secondary | ICD-10-CM

## 2018-10-29 DIAGNOSIS — E538 Deficiency of other specified B group vitamins: Secondary | ICD-10-CM

## 2018-10-29 DIAGNOSIS — L959 Vasculitis limited to the skin, unspecified: Secondary | ICD-10-CM | POA: Diagnosis not present

## 2018-10-29 DIAGNOSIS — N760 Acute vaginitis: Secondary | ICD-10-CM

## 2018-10-29 DIAGNOSIS — E11610 Type 2 diabetes mellitus with diabetic neuropathic arthropathy: Secondary | ICD-10-CM

## 2018-10-29 DIAGNOSIS — E1140 Type 2 diabetes mellitus with diabetic neuropathy, unspecified: Secondary | ICD-10-CM

## 2018-10-29 DIAGNOSIS — M5440 Lumbago with sciatica, unspecified side: Secondary | ICD-10-CM

## 2018-10-29 DIAGNOSIS — E785 Hyperlipidemia, unspecified: Secondary | ICD-10-CM

## 2018-10-29 MED ORDER — OXYCODONE HCL 15 MG PO TABS: 15.0000 mg | ORAL_TABLET | Freq: Four times a day (QID) | ORAL | 0 refills | Status: DC | PRN

## 2018-10-29 MED ORDER — FLUCONAZOLE 150 MG PO TABS: 150.0000 mg | ORAL_TABLET | Freq: Once | ORAL | 1 refills | Status: AC

## 2018-10-29 NOTE — Assessment & Plan Note (Signed)
In remission.

## 2018-10-29 NOTE — Assessment & Plan Note (Signed)
No relapse 

## 2018-10-29 NOTE — Assessment & Plan Note (Signed)
On Oxycodone 15 mg  Potential benefits of a long term opioids use as well as potential risks (i.e. addiction risk, apnea etc) and complications (i.e. Somnolence, constipation and others) were explained to the patient and were aknowledged. Risks of use w/benzodiazepines discussed 

## 2018-10-29 NOTE — Assessment & Plan Note (Signed)
Offered cardiac CT scan for calcium scoring 

## 2018-10-29 NOTE — Patient Instructions (Signed)

## 2018-10-29 NOTE — Assessment & Plan Note (Signed)
Levothroid 

## 2018-10-29 NOTE — Assessment & Plan Note (Signed)
Vit B12 

## 2018-10-29 NOTE — Assessment & Plan Note (Signed)
F/u w/Dr March Rummage

## 2018-10-29 NOTE — Assessment & Plan Note (Signed)
Diflucan

## 2018-10-29 NOTE — Progress Notes (Signed)
Subjective:  Patient ID: Katherine Herrera, female    DOB: September 07, 1966  Age: 52 y.o. MRN: [id number]  CC: No chief complaint on file.   HPI Katherine Herrera presents for DM, HTN, foot ulcers. The pt is planning to have a L foot surgery... F/u LBP  Outpatient Medications Prior to Visit  Medication Sig Dispense Refill  . aspirin (BAYER ASPIRIN) 325 MG tablet Take 1 tablet (325 mg total) by mouth daily. 100 tablet 3  . B-D INS SYR ULTRAFINE 1CC/31G 31G X 5/16" 1 ML MISC USE AS DIRECTED 100 each 0  . baclofen (LIORESAL) 10 MG tablet Take 2 tablets (20 mg total) by mouth 2 (two) times daily. 180 tablet 1  . Cholecalciferol (VITAMIN D3) 1000 UNIT tablet Take 1,000 Units by mouth daily.     . clonazePAM (KLONOPIN) 0.5 MG tablet Take 1 tablet (0.5 mg total) by mouth 2 (two) times daily as needed for anxiety.    . docusate sodium (COLACE) 100 MG capsule Take 200 mg by mouth every evening.    . ezetimibe (ZETIA) 10 MG tablet Take 1 tablet (10 mg total) by mouth daily. 90 tablet 1  . gabapentin (NEURONTIN) 800 MG tablet Take 1 tablet (800 mg total) by mouth 4 (four) times daily.    Marland Kitchen glucose blood (ONETOUCH VERIO) test strip Use to check blood sugar 2 times per day. 100 each 12  . insulin NPH-regular Human (NOVOLIN 70/30) (70-30) 100 UNIT/ML injection Inject 60 Units into the skin 2 (two) times daily with a meal.    . Insulin Syringe-Needle U-100 (SURE COMFORT INSULIN SYRINGE) 31G X 5/16" 0.5 ML MISC USE AS DIRECTED WITH INSULIN 4 TIMES A DAY 400 each 2  . lamoTRIgine (LAMICTAL) 25 MG tablet TAKE 1 TABLET BY MOUTH TWO  TIMES DAILY 180 tablet 1  . Lancet Devices (ACCU-CHEK SOFTCLIX) lancets Test two times daily 100 each 12  . levothyroxine (SYNTHROID, LEVOTHROID) 75 MCG tablet TAKE 1 TABLET BY MOUTH  DAILY 90 tablet 3  . lisinopril (PRINIVIL,ZESTRIL) 10 MG tablet Take 1 tablet (10 mg total) by mouth daily. 90 tablet 1  . loratadine (CLARITIN) 10 MG tablet Take 10 mg by mouth daily as needed for  allergies.    Katherine Herrera DELICA LANCETS 10U MISC TEST TWO TIMES DAILY 200 each 2  . oxyCODONE (ROXICODONE) 15 MG immediate release tablet Take 1 tablet (15 mg total) by mouth every 6 (six) hours as needed for pain. 120 tablet 0  . Probiotic Product (RESTORA) CAPS 1 po qd 30 capsule 1  . rOPINIRole (REQUIP) 0.5 MG tablet Take 1 tablet (0.5 mg total) by mouth at bedtime. 90 tablet 1  . vitamin B-12 (CYANOCOBALAMIN) 1000 MCG tablet Take 1,000 mcg by mouth daily.      Marland Kitchen amoxicillin-clavulanate (AUGMENTIN) 875-125 MG tablet Take 1 tablet by mouth 2 (two) times daily. (Patient not taking: Reported on 10/29/2018) 20 tablet 0   No facility-administered medications prior to visit.     ROS: Review of Systems  Constitutional: Negative for activity change, appetite change, chills, fatigue and unexpected weight change.  HENT: Negative for congestion, mouth sores and sinus pressure.   Eyes: Negative for visual disturbance.  Respiratory: Negative for cough and chest tightness.   Gastrointestinal: Negative for abdominal pain and nausea.  Genitourinary: Negative for difficulty urinating, frequency and vaginal pain.  Musculoskeletal: Positive for arthralgias, back pain and gait problem.  Skin: Negative for pallor and rash.  Neurological: Negative for dizziness, tremors, weakness,  numbness and headaches.  Psychiatric/Behavioral: Negative for confusion, sleep disturbance and suicidal ideas. The patient is nervous/anxious.     Objective:  BP 130/76   Pulse 86   Temp 98.4 F (36.9 C) (Oral)   Ht 5\' 9"  (1.753 m)   Wt 220 lb (99.8 kg)   SpO2 96%   BMI 32.49 kg/m   BP Readings from Last 3 Encounters:  10/29/18 130/76  10/17/18 122/68  10/05/18 (!) 113/55    Wt Readings from Last 3 Encounters:  10/29/18 220 lb (99.8 kg)  10/17/18 223 lb (101.2 kg)  09/28/18 260 lb (117.9 kg)    Physical Exam Constitutional:      General: She is not in acute distress.    Appearance: She is well-developed.    HENT:     Head: Normocephalic.     Right Ear: External ear normal.     Left Ear: External ear normal.     Nose: Nose normal.  Eyes:     General:        Right eye: No discharge.        Left eye: No discharge.     Conjunctiva/sclera: Conjunctivae normal.     Pupils: Pupils are equal, round, and reactive to light.  Neck:     Musculoskeletal: Normal range of motion and neck supple.     Thyroid: No thyromegaly.     Vascular: No JVD.     Trachea: No tracheal deviation.  Cardiovascular:     Rate and Rhythm: Normal rate and regular rhythm.     Heart sounds: Normal heart sounds.  Pulmonary:     Effort: No respiratory distress.     Breath sounds: No stridor. No wheezing.  Abdominal:     General: Bowel sounds are normal. There is no distension.     Palpations: Abdomen is soft. There is no mass.     Tenderness: There is no abdominal tenderness. There is no guarding or rebound.  Musculoskeletal:        General: Swelling and tenderness present.  Lymphadenopathy:     Cervical: No cervical adenopathy.  Skin:    Findings: No erythema or rash.  Neurological:     Mental Status: She is oriented to person, place, and time.     Cranial Nerves: No cranial nerve deficit.     Motor: No abnormal muscle tone.     Coordination: Coordination normal.     Deep Tendon Reflexes: Reflexes normal.  Psychiatric:        Behavior: Behavior normal.        Thought Content: Thought content normal.        Judgment: Judgment normal.   LS tender Post 0p feet changes  Lab Results  Component Value Date   WBC 9.9 10/04/2018   HGB 12.8 10/04/2018   HCT 41.5 10/04/2018   PLT 309 10/04/2018   GLUCOSE 132 (H) 10/04/2018   CHOL 223 (H) 07/19/2016   TRIG (H) 07/19/2016    527.0 Triglyceride is over 400; calculations on Lipids are invalid.   HDL 27.40 (L) 07/19/2016   LDLDIRECT 106.0 07/19/2016   LDLCALC 81 01/02/2013   ALT 11 10/02/2018   AST 15 10/02/2018   NA 139 10/04/2018   K 4.4 10/04/2018   CL 100  10/04/2018   CREATININE 0.92 10/04/2018   BUN 25 (H) 10/04/2018   CO2 31 10/04/2018   TSH 1.151 09/30/2018   INR 0.95 01/28/2014   HGBA1C 8.5 (H) 09/29/2018   MICROALBUR 0.9 07/19/2016  No results found.  Assessment & Plan:   There are no diagnoses linked to this encounter.   No orders of the defined types were placed in this encounter.    Follow-up: No follow-ups on file.  Walker Kehr, MD

## 2018-10-30 ENCOUNTER — Telehealth: Payer: Self-pay | Admitting: Internal Medicine

## 2018-10-30 NOTE — Telephone Encounter (Signed)
Medication not on current medication but pt was prescribed this in the past.

## 2018-10-30 NOTE — Telephone Encounter (Unsigned)
Copied from [place] [id number]. Topic: Quick Communication - Rx Refill/Question >> Oct 30, 2018 11:37 AM Judyann Munson wrote: Medication: traMADol (ULTRAM) 50 MG tablet   Has the patient contacted their pharmacy?yes   Preferred Pharmacy (with phone number or street name):[place], [facility] [address] [place] [address] Phone: [phone] Fax: [phone] Not a 24 hour pharmacy; exact hours not known.    Agent: Please be advised that RX refills may take up to 3 business days. We ask that you follow-up with your pharmacy.

## 2018-11-01 ENCOUNTER — Telehealth: Payer: Self-pay | Admitting: *Deleted

## 2018-11-01 NOTE — Telephone Encounter (Signed)
Last filled in 2016, please advise

## 2018-11-01 NOTE — Telephone Encounter (Signed)
"  I talked to Dr. March Rummage last week.  I haven't heard back from you about when my date of surgery is.  If you could, call me back."

## 2018-11-02 NOTE — Telephone Encounter (Signed)
Do not fill tramadol.  The patient is on oxycodone.  Thank you

## 2018-11-05 NOTE — Telephone Encounter (Signed)
"  No one has called me about the scheduling of my surgery."  Have you signed consent forms and did you receive a surgery kit?  "Yes, I did all that and I received a bag with the brush and ice pack in it.  I'd like to schedule it around the last week of January."  We already have you scheduled for January 29.  Someone from the surgical center will give you a call a day or two prior to your surgery date and they will give you your arrival time.  All you need to do is go online and register with the surgery center, instructions are in the brochure that we gave you on a dark blue page.  It says [employer].  "I'm not good with computers but I can get my daughter to help me.  Thank you so much."

## 2018-11-20 ENCOUNTER — Telehealth: Payer: Self-pay | Admitting: *Deleted

## 2018-11-20 NOTE — Telephone Encounter (Addendum)
"  I got a call from someone at the surgery center.  They said I had to pay $600 upfront in order for me to have the surgery. I don't have that type of money all at once.  They told me to call you and let you know."  Dr. March Rummage can do your surgery at [facility].  You will be able to make payment arrangements.  We will file your claim.  Once we find out what you will owe, we'll send you a statement.  Once you get the statement, you can call Jocelyn Lamer in our insurance department and then you can schedule payment arrangements.  "What about [facility], how do I make arrangements with them?"  They will send you a statement then you can make arrangements with them as well.  You will need a history and physical form completed by your primary care physician.  The appointment has to be within a 30 day span of your surgical date.  Have you had a physical done recently?  "Yes, I had one done on January 16."  Dr. March Rummage could possibly do your surgery on February 12.  "Can you fax me the form.  I will see if my doctor can go ahead and fill the form out.  He may want to see me again before he fills it out."  Call me back and let me know as soon as you find out.  What's your fax number?  "I will.  My fax number is [phone]. "  I faxed the history and physical forms to the patient.

## 2018-11-21 ENCOUNTER — Telehealth: Payer: Self-pay

## 2018-11-21 NOTE — Telephone Encounter (Signed)
Form in folder  Copied from [place]. Topic: General - Other >> Nov 20, 2018  4:25 PM Valla Leaver wrote: Reason for CRM: Patient needs a form filled out by [facility] and faxed to Fax:[phone]. Her podiatrist says this is needed for her diabetic foot surgery. "You will need a history and physical form completed by your primary care physician. " copy from TE today from Podiatry. She's faxing these forms now. >> Nov 20, 2018  4:42 PM Para Skeans A wrote: Juluis Rainier

## 2018-11-21 NOTE — Telephone Encounter (Signed)
Postop appointments have been canceled.  °

## 2018-11-22 ENCOUNTER — Telehealth: Payer: Self-pay | Admitting: *Deleted

## 2018-11-22 NOTE — Telephone Encounter (Signed)
"  I'm ready to schedule my surgery now.  I have an appointment set up to see my primary care doctor on February 19."  Do you have a date that you want to have your surgery in mind?"  I'd like to do it in March."  Dr. March Rummage can do it at [facility] on January 02, 2019 at 8:30 am.  "Okay, that date will be fine.  Who do I need to speak to at [facility] about making payment arrangements?"  You should get a call from someone at [facility] regarding that.  A pre-op nurse will call you and she can give you your arrival time for your surgery.  They usually have you come about two hours prior to your surgery date.  "Okay, thank you so much."

## 2018-11-25 NOTE — Progress Notes (Signed)
  Subjective:  Patient ID: Katherine Herrera, female    DOB: September 16, 1966,  MRN: [id number]  Chief Complaint  Patient presents with  . Routine Post Op    #5dos 05.09.2019 Metatarsal Head Res. 5th Lt; Poss. Excision of Pressure Ulcer Lt/only appt. available pt in Hospital    DOS: 03/08/2018 Procedure: Left fifth metatarsal head resection, excision of pressure ulcer.  53 y.o. female returns for post-op check. Denies N/V/F/Ch.  Returns for f/u has been in the hospital recently for cellulitis state the ulcer on her foot has come back  Objective:   General AA&O x3. Normal mood and affect.  Vascular Foot warm and well perfused.  Neurologic Gross sensation intact.  Dermatologic Return hytperkeratosis left 5th MPJ Edema LLE reslving cellulitis Pre-ulcerative lesion right fifth met styloid process  Orthopedic: Tenderness to palpation noted about the surgical site.    Assessment & Plan:  Patient was evaluated and treated and all questions answered.  Diabetic ulcer left 5th MPJ -Debrided today with 312 blade -Dressed with silvadene DSD -F/u in 2 weeks for recheck    Return in about 2 weeks (around 10/25/2018) for wound care.

## 2018-11-30 ENCOUNTER — Ambulatory Visit: Payer: Medicare Other | Admitting: Podiatry

## 2018-11-30 DIAGNOSIS — M21961 Unspecified acquired deformity of right lower leg: Secondary | ICD-10-CM | POA: Diagnosis not present

## 2018-11-30 DIAGNOSIS — E11621 Type 2 diabetes mellitus with foot ulcer: Secondary | ICD-10-CM

## 2018-11-30 DIAGNOSIS — L97429 Non-pressure chronic ulcer of left heel and midfoot with unspecified severity: Secondary | ICD-10-CM | POA: Diagnosis not present

## 2018-11-30 DIAGNOSIS — M7751 Other enthesopathy of right foot: Secondary | ICD-10-CM

## 2018-11-30 DIAGNOSIS — M898X7 Other specified disorders of bone, ankle and foot: Secondary | ICD-10-CM

## 2018-11-30 NOTE — Progress Notes (Signed)
  Subjective:  Patient ID: Katherine Herrera, female    DOB: September 01, 1966,  MRN: [id number]  Chief Complaint  Patient presents with  . Diabetic Ulcer    1 month follow up left foot ulcer   53 y.o. female returns for care of her left foot ulcer. Had to reschedule her surgery now pending in March having pain in both feet. Using the AFO. Denies new issues.  Objective:   General AA&O x3. Normal mood and affect.  Vascular Foot warm and well perfused.  Neurologic Gross sensation diminished.  Dermatologic Ulcer present to the left fifth metatarsal head measuring 1.5x1.5 post-debridement Pre-ulcerative lesions about the right fifth metatarsal head, fifth metatarsal base  Orthopedic: Tenderness to palpation noted about the fifth metatarsal head area bilaterally, fifth met base right    Assessment & Plan:  Patient was evaluated and treated and all questions answered.  Left foot wound sub-met fifth metatarsal -Planning metatarsal resection with concomitant wound closure at that time and possible adjacent tissue transfer. -Wound debrided as below  Procedure: Excisional Debridement of Wound Rationale: Removal of non-viable soft tissue from the wound to promote healing.  Anesthesia: none Pre-Debridement Wound Measurements: 1 cm x 1 cm x 0.3 cm  Post-Debridement Wound Measurements: 1.5 cm x 1.5 cm x 0.3 cm  Type of Debridement: Sharp Excisional Tissue Removed: Non-viable soft tissue Depth of Debridement: subcutaneous tissue. Technique: Sharp excisional debridement to bleeding, viable wound base.  Dressing: Dry, sterile, compression dressing. Disposition: Patient tolerated procedure well. Patient to return in 1 week for follow-up.  Right fifth metatarsal head ulceration -Planning right 5th metatarsal head excision.  Right fifth metatarsal styloid process pre-ulcerative lesion with cavus foot type. -Plan for 5th met base exostectomy   No follow-ups on file.

## 2018-12-03 ENCOUNTER — Telehealth: Payer: Self-pay | Admitting: *Deleted

## 2018-12-03 MED ORDER — DOXYCYCLINE HYCLATE 100 MG PO TABS: 100.0000 mg | ORAL_TABLET | Freq: Two times a day (BID) | ORAL | 0 refills | Status: DC

## 2018-12-03 NOTE — Addendum Note (Signed)
Addended by: Hardie Pulley on: 12/03/2018 05:15 PM   Modules accepted: Orders

## 2018-12-03 NOTE — Telephone Encounter (Signed)
Pt states she was to get an antibiotic but she thinks it was sent to the wrong pharmacy.

## 2018-12-03 NOTE — Telephone Encounter (Signed)
Rx sent can you inform. 

## 2018-12-04 NOTE — Telephone Encounter (Signed)
Left message informing pt the rx had been sent to her pharmacy.

## 2018-12-06 ENCOUNTER — Other Ambulatory Visit: Payer: Medicare Other

## 2018-12-14 ENCOUNTER — Ambulatory Visit: Payer: Medicare Other | Admitting: Podiatry

## 2018-12-14 VITALS — Temp 97.9°F

## 2018-12-14 DIAGNOSIS — L97429 Non-pressure chronic ulcer of left heel and midfoot with unspecified severity: Secondary | ICD-10-CM

## 2018-12-14 DIAGNOSIS — E11621 Type 2 diabetes mellitus with foot ulcer: Secondary | ICD-10-CM

## 2018-12-19 ENCOUNTER — Ambulatory Visit (INDEPENDENT_AMBULATORY_CARE_PROVIDER_SITE_OTHER)
Admission: RE | Admit: 2018-12-19 | Discharge: 2018-12-19 | Disposition: A | Discharge status: Home / Self Care | Payer: Medicare Other | Source: Ambulatory Visit | Attending: Internal Medicine | Admitting: Internal Medicine

## 2018-12-19 ENCOUNTER — Other Ambulatory Visit (INDEPENDENT_AMBULATORY_CARE_PROVIDER_SITE_OTHER): Payer: Medicare Other

## 2018-12-19 ENCOUNTER — Encounter: Payer: Self-pay | Admitting: Internal Medicine

## 2018-12-19 ENCOUNTER — Ambulatory Visit (INDEPENDENT_AMBULATORY_CARE_PROVIDER_SITE_OTHER): Payer: Medicare Other | Admitting: Internal Medicine

## 2018-12-19 VITALS — BP 122/68 | HR 87 | Temp 97.9°F | Ht 69.0 in | Wt 216.0 lb

## 2018-12-19 DIAGNOSIS — Z01818 Encounter for other preprocedural examination: Secondary | ICD-10-CM

## 2018-12-19 DIAGNOSIS — E559 Vitamin D deficiency, unspecified: Secondary | ICD-10-CM | POA: Diagnosis not present

## 2018-12-19 DIAGNOSIS — E785 Hyperlipidemia, unspecified: Secondary | ICD-10-CM

## 2018-12-19 DIAGNOSIS — R748 Abnormal levels of other serum enzymes: Secondary | ICD-10-CM

## 2018-12-19 DIAGNOSIS — E1140 Type 2 diabetes mellitus with diabetic neuropathy, unspecified: Secondary | ICD-10-CM | POA: Diagnosis not present

## 2018-12-19 DIAGNOSIS — E0842 Diabetes mellitus due to underlying condition with diabetic polyneuropathy: Secondary | ICD-10-CM

## 2018-12-19 DIAGNOSIS — E538 Deficiency of other specified B group vitamins: Secondary | ICD-10-CM

## 2018-12-19 DIAGNOSIS — L959 Vasculitis limited to the skin, unspecified: Secondary | ICD-10-CM | POA: Diagnosis not present

## 2018-12-19 LAB — CBC WITH DIFFERENTIAL/PLATELET
Basophils Absolute: 0.1 10*3/uL (ref 0.0–0.1)
Basophils Relative: 1.3 % (ref 0.0–3.0)
Eosinophils Absolute: 0.2 10*3/uL (ref 0.0–0.7)
Eosinophils Relative: 1.4 % (ref 0.0–5.0)
HCT: 41.9 % (ref 36.0–46.0)
Hemoglobin: 13.7 g/dL (ref 12.0–15.0)
Lymphocytes Relative: 25.7 % (ref 12.0–46.0)
Lymphs Abs: 2.8 10*3/uL (ref 0.7–4.0)
MCHC: 32.7 g/dL (ref 30.0–36.0)
MCV: 84.8 fl (ref 78.0–100.0)
Monocytes Absolute: 0.5 10*3/uL (ref 0.1–1.0)
Monocytes Relative: 4.4 % (ref 3.0–12.0)
Neutro Abs: 7.3 10*3/uL (ref 1.4–7.7)
Neutrophils Relative %: 67.2 % (ref 43.0–77.0)
Platelets: 294 10*3/uL (ref 150.0–400.0)
RBC: 4.94 Mil/uL (ref 3.87–5.11)
RDW: 14.8 % (ref 11.5–15.5)
WBC: 10.9 10*3/uL — ABNORMAL HIGH (ref 4.0–10.5)

## 2018-12-19 LAB — TSH: TSH: 0.51 u[IU]/mL (ref 0.35–4.50)

## 2018-12-19 LAB — HEPATIC FUNCTION PANEL
ALT: 14 U/L (ref 0–35)
AST: 13 U/L (ref 0–37)
Albumin: 3.5 g/dL (ref 3.5–5.2)
Alkaline Phosphatase: 93 U/L (ref 39–117)
Bilirubin, Direct: 0.1 mg/dL (ref 0.0–0.3)
Total Bilirubin: 0.2 mg/dL (ref 0.2–1.2)
Total Protein: 7 g/dL (ref 6.0–8.3)

## 2018-12-19 LAB — BASIC METABOLIC PANEL
BUN: 15 mg/dL (ref 6–23)
CO2: 28 mEq/L (ref 19–32)
Calcium: 8.8 mg/dL (ref 8.4–10.5)
Chloride: 102 mEq/L (ref 96–112)
Creatinine, Ser: 1.03 mg/dL (ref 0.40–1.20)
GFR: 56.04 mL/min — ABNORMAL LOW (ref >60.00)
Glucose, Bld: 264 mg/dL — ABNORMAL HIGH (ref 70–99)
Potassium: 4.2 mEq/L (ref 3.5–5.1)
Sodium: 137 mEq/L (ref 135–145)

## 2018-12-19 LAB — VITAMIN B12: Vitamin B-12: >1525 pg/mL — ABNORMAL HIGH (ref 211–911)

## 2018-12-19 LAB — VITAMIN D 25 HYDROXY (VIT D DEFICIENCY, FRACTURES): VITD: 28.51 ng/mL — ABNORMAL LOW (ref 30.00–100.00)

## 2018-12-19 NOTE — Assessment & Plan Note (Signed)
Katherine Herrera should be clear for her podiatric surgery assuming her labs, CXR and her cardiac ECHO are acceptable (all ordered). Thank you!

## 2018-12-19 NOTE — Assessment & Plan Note (Signed)
No relapse 

## 2018-12-19 NOTE — Assessment & Plan Note (Signed)
On B12 po 

## 2018-12-19 NOTE — Assessment & Plan Note (Signed)
Improve CBGs F/u w/Dr Chalmers Cater

## 2018-12-19 NOTE — Progress Notes (Signed)
Subjective:  Patient ID: Katherine Herrera, female    DOB: December 28, 1965  Age: 53 y.o. MRN: [id number]  CC: No chief complaint on file.   HPI Katherine Herrera presents for IM consult Reason : B foot surgery in March 2019 Req by Dr March Rummage Hx - 53 yo F w/multiple med problems (DM, HTN, LBP) - all stable  Past Medical History:  Diagnosis Date  . Anxiety   . COPD (chronic obstructive pulmonary disease) ([place])   . DM (diabetes mellitus) ([place])   . Gout    possible  . Hyperlipidemia   . Hypothyroidism   . LBP (low back pain)    DR Patrice Paradise  . Migraine   . Peripheral neuropathy   . Syncope    poss conversion disorder vs psychogenic seizures  . Tachycardia    diet coke overuse?  Marland Kitchen Tenosynovitis of foot and ankle 09/03/2013  . TTS (tarsal tunnel syndrome) 2010   Left Foot [facility] in [place]  . Vitamin D deficiency    Past Surgical History:  Procedure Laterality Date  . ABDOMINAL HYSTERECTOMY    . CARPAL TUNNEL RELEASE  2010   LEFT  . CERVICAL FUSION     C4-5    reports that she has been smoking. She has been smoking about 1.00 pack per day. She has never used smokeless tobacco. She reports that she does not drink alcohol or use drugs. family history includes Cancer (age of onset: 37) in her father; Diabetes in her mother; Hypertension in her mother; [name] in her brother. Allergies  Allergen Reactions  . Hydromorphone Hcl Itching  . Cephalexin Nausea And Vomiting  . Demerol  [Meperidine Hcl] Rash  . Lovastatin Other (See Comments)    Other reaction(s): Unknown (See Comments) Possible myalgia Unknown   . Metformin And Related Nausea And Vomiting  . Sulfamethoxazole-Trimethoprim     Other reaction(s): Unknown DILI, pancreatitis  . Crestor [Rosuvastatin Calcium]     myalgia  . Niacin Other (See Comments)    Unknown   . Paroxetine Other (See Comments)    Other reaction(s): Unknown (See Comments) unknown Unknown      Outpatient Medications Prior to Visit  Medication Sig  Dispense Refill  . aspirin (BAYER ASPIRIN) 325 MG tablet Take 1 tablet (325 mg total) by mouth daily. 100 tablet 3  . B-D INS SYR ULTRAFINE 1CC/31G 31G X 5/16" 1 ML MISC USE AS DIRECTED 100 each 0  . baclofen (LIORESAL) 10 MG tablet Take 2 tablets (20 mg total) by mouth 2 (two) times daily. 180 tablet 1  . Blood Glucose Monitoring Suppl (ONETOUCH VERIO) w/Device KIT     . Cholecalciferol (VITAMIN D3) 1000 UNIT tablet Take 1,000 Units by mouth daily.     . clonazePAM (KLONOPIN) 0.5 MG tablet Take 1 tablet (0.5 mg total) by mouth 2 (two) times daily as needed for anxiety.    . docusate sodium (COLACE) 100 MG capsule Take 200 mg by mouth every evening.    Marland Kitchen doxycycline (VIBRA-TABS) 100 MG tablet Take 1 tablet (100 mg total) by mouth 2 (two) times daily. 20 tablet 0  . ezetimibe (ZETIA) 10 MG tablet Take 1 tablet (10 mg total) by mouth daily. 90 tablet 1  . gabapentin (NEURONTIN) 800 MG tablet Take 1 tablet (800 mg total) by mouth 4 (four) times daily.    Marland Kitchen glucose blood (ONETOUCH VERIO) test strip Use to check blood sugar 2 times per day. 100 each 12  . insulin NPH-regular Human (  NOVOLIN 70/30) (70-30) 100 UNIT/ML injection Inject 60 Units into the skin 2 (two) times daily with a meal.    . Insulin Syringe-Needle U-100 (SURE COMFORT INSULIN SYRINGE) 31G X 5/16" 0.5 ML MISC USE AS DIRECTED WITH INSULIN 4 TIMES A DAY 400 each 2  . lamoTRIgine (LAMICTAL) 25 MG tablet TAKE 1 TABLET BY MOUTH TWO  TIMES DAILY 180 tablet 1  . Lancet Devices (ACCU-CHEK SOFTCLIX) lancets Test two times daily 100 each 12  . levothyroxine (SYNTHROID, LEVOTHROID) 75 MCG tablet TAKE 1 TABLET BY MOUTH  DAILY 90 tablet 3  . lisinopril (PRINIVIL,ZESTRIL) 10 MG tablet Take 1 tablet (10 mg total) by mouth daily. 90 tablet 1  . loratadine (CLARITIN) 10 MG tablet Take 10 mg by mouth daily as needed for allergies.    Glory Rosebush DELICA LANCETS 20U MISC TEST TWO TIMES DAILY 200 each 2  . oxyCODONE (ROXICODONE) 15 MG immediate release  tablet Take 1 tablet (15 mg total) by mouth every 6 (six) hours as needed for pain. 120 tablet 0  . Probiotic Product (RESTORA) CAPS 1 po qd 30 capsule 1  . rOPINIRole (REQUIP) 0.5 MG tablet Take 1 tablet (0.5 mg total) by mouth at bedtime. 90 tablet 1  . vitamin B-12 (CYANOCOBALAMIN) 1000 MCG tablet Take 1,000 mcg by mouth daily.       No facility-administered medications prior to visit.     ROS: Review of Systems  Constitutional: Positive for fatigue. Negative for activity change, appetite change, chills and unexpected weight change.  HENT: Negative for congestion, mouth sores and sinus pressure.   Eyes: Negative for visual disturbance.  Respiratory: Negative for cough, chest tightness, shortness of breath and wheezing.   Cardiovascular: Positive for leg swelling. Negative for chest pain and palpitations.  Gastrointestinal: Negative for abdominal pain and nausea.  Genitourinary: Negative for difficulty urinating, frequency and vaginal pain.  Musculoskeletal: Positive for arthralgias, back pain, gait problem and joint swelling.  Skin: Negative for pallor and rash.  Neurological: Positive for numbness. Negative for dizziness, tremors, weakness and headaches.  Psychiatric/Behavioral: Negative for confusion, sleep disturbance and suicidal ideas. The patient is nervous/anxious.     Objective:  BP 122/68 (BP Location: Left Arm, Patient Position: Sitting, Cuff Size: Normal)   Pulse 87   Temp 97.9 F (36.6 C) (Oral)   Ht _0  (1.753 m)   Wt 216 lb (98 kg)   SpO2 99%   BMI 31.90 kg/m   BP Readings from Last 3 Encounters:  12/19/18 122/68  10/29/18 130/76  10/17/18 122/68    Wt Readings from Last 3 Encounters:  12/19/18 216 lb (98 kg)  10/29/18 220 lb (99.8 kg)  10/17/18 223 lb (101.2 kg)    Physical Exam Constitutional:      General: She is not in acute distress.    Appearance: She is well-developed.  HENT:     Head: Normocephalic.     Right Ear: External ear normal.      Left Ear: External ear normal.     Nose: Nose normal.  Eyes:     General:        Right eye: No discharge.        Left eye: No discharge.     Conjunctiva/sclera: Conjunctivae normal.     Pupils: Pupils are equal, round, and reactive to light.  Neck:     Musculoskeletal: Normal range of motion and neck supple.     Thyroid: No thyromegaly.     Vascular: No JVD.  Trachea: No tracheal deviation.  Cardiovascular:     Rate and Rhythm: Normal rate and regular rhythm.     Heart sounds: Murmur present.  Pulmonary:     Effort: No respiratory distress.     Breath sounds: No stridor. No wheezing.  Abdominal:     General: Bowel sounds are normal. There is no distension.     Palpations: Abdomen is soft. There is no mass.     Tenderness: There is no abdominal tenderness. There is no guarding or rebound.  Musculoskeletal:        General: No tenderness.  Lymphadenopathy:     Cervical: No cervical adenopathy.  Skin:    Findings: No erythema or rash.  Neurological:     Cranial Nerves: No cranial nerve deficit.     Motor: No abnormal muscle tone.     Coordination: Coordination normal.     Gait: Gait abnormal.     Deep Tendon Reflexes: Reflexes normal.  Psychiatric:        Behavior: Behavior normal.        Thought Content: Thought content normal.        Judgment: Judgment normal.   obese 1-2/6 murmur  Procedure: EKG Indication: pre-op Impression: NSR. RBBB  Lab Results  Component Value Date   WBC 9.9 10/04/2018   HGB 12.8 10/04/2018   HCT 41.5 10/04/2018   PLT 309 10/04/2018   GLUCOSE 132 (H) 10/04/2018   CHOL 223 (H) 07/19/2016   TRIG (H) 07/19/2016    527.0 Triglyceride is over 400; calculations on Lipids are invalid.   HDL 27.40 (L) 07/19/2016   LDLDIRECT 106.0 07/19/2016   LDLCALC 81 01/02/2013   ALT 11 10/02/2018   AST 15 10/02/2018   NA 139 10/04/2018   K 4.4 10/04/2018   CL 100 10/04/2018   CREATININE 0.92 10/04/2018   BUN 25 (H) 10/04/2018   CO2 31 10/04/2018     TSH 1.151 09/30/2018   INR 0.95 01/28/2014   HGBA1C 8.5 (H) 09/29/2018   MICROALBUR 0.9 07/19/2016    No results found.  Assessment & Plan:   There are no diagnoses linked to this encounter.   No orders of the defined types were placed in this encounter.    Follow-up: No follow-ups on file.  Walker Kehr, MD

## 2018-12-19 NOTE — Assessment & Plan Note (Signed)
EKG

## 2018-12-19 NOTE — Assessment & Plan Note (Signed)
Chronic sx's/pain

## 2018-12-19 NOTE — Assessment & Plan Note (Signed)
LFTs 

## 2018-12-20 LAB — URINALYSIS
Bilirubin Urine: NEGATIVE
Hgb urine dipstick: NEGATIVE
Ketones, ur: NEGATIVE
Leukocytes,Ua: NEGATIVE
Nitrite: NEGATIVE
Specific Gravity, Urine: >=1.03 — AB (ref 1.000–1.030)
Total Protein, Urine: NEGATIVE
Urine Glucose: 500 — AB
Urobilinogen, UA: 0.2 (ref 0.0–1.0)
pH: 5.5 (ref 5.0–8.0)

## 2018-12-22 ENCOUNTER — Other Ambulatory Visit: Payer: Self-pay | Admitting: Internal Medicine

## 2018-12-22 MED ORDER — VITAMIN D3 25 MCG (1000 UNIT) PO TABS: 2000.0000 [IU] | ORAL_TABLET | Freq: Every day | ORAL | 3 refills | Status: DC

## 2018-12-22 MED ORDER — VITAMIN D3 1.25 MG (50000 UT) PO CAPS: 1.0000 | ORAL_CAPSULE | ORAL | 0 refills | Status: DC

## 2018-12-25 NOTE — Progress Notes (Signed)
Cardiology Office Note   Date:  12/31/2018   ID:  SANTOS HARDWICK, DOB 08-29-1966, MRN [id number]  PCP:  Cassandria Anger, MD  Cardiologist:   Jenkins Rouge, MD   No chief complaint on file.     History of Present Illness: Katherine Herrera is a 53 y.o. female who presents for consultation regarding chest pain and syncope Referred by Dr Laurian Brim.  She is a smoker with COPD, HLD, DM Hypothyroidism  Primary indicated syncope ? Conversion reaction psychogenic or seizures. She needs foot surgery for an ulcer by Dr March Rummage 5 th metatarsal ABI"s 09/29/18 normal but non compressible and monophasic distal pulses   TTE 12/26/18 EF 60-65% no valve disease  She is scheduled for foot surgery 01/02/19  No angina but has not had stress testing in over 15 years and is diabetic with smoking  Had nightmares with chantix in past     Past Medical History:  Diagnosis Date  . Anxiety   . COPD (chronic obstructive pulmonary disease) ([place])   . DM (diabetes mellitus) ([place])    type 2  . Family history of adverse reaction to anesthesia    mother hard to wake up  . Gout    possible  . Hyperlipidemia   . Hypothyroidism   . LBP (low back pain)    DR Patrice Paradise  . Migraine   . Peripheral neuropathy   . Syncope    poss conversion disorder vs psychogenic seizures  . Tachycardia    diet coke overuse?  Marland Kitchen Tenosynovitis of foot and ankle 09/03/2013  . TTS (tarsal tunnel syndrome) 2010   Left Foot Vog[place] in [place]  . Vitamin D deficiency   . Wound, open    both feet outer parts no drainage, changes dressing 1 day, wounds dime size for last month    Past Surgical History:  Procedure Laterality Date  . ABDOMINAL HYSTERECTOMY    . CARPAL TUNNEL RELEASE  2010   LEFT  . CERVICAL FUSION     C4-5     Current Outpatient Medications  Medication Sig Dispense Refill  . aspirin (BAYER ASPIRIN) 325 MG tablet Take 1 tablet (325 mg total) by mouth daily. 100 tablet 3  . B-D INS SYR ULTRAFINE 1CC/31G 31G X 5/16"  1 ML MISC USE AS DIRECTED 100 each 0  . baclofen (LIORESAL) 10 MG tablet Take 2 tablets (20 mg total) by mouth 2 (two) times daily. 180 tablet 1  . Blood Glucose Monitoring Suppl (ONETOUCH VERIO) w/Device KIT     . cholecalciferol (VITAMIN D) 25 MCG (1000 UT) tablet Take 2 tablets (2,000 Units total) by mouth daily. 100 tablet 3  . clonazePAM (KLONOPIN) 0.5 MG tablet Take 1 tablet (0.5 mg total) by mouth 2 (two) times daily as needed for anxiety.    . docusate sodium (COLACE) 100 MG capsule Take 200 mg by mouth every evening.    Marland Kitchen doxycycline (VIBRA-TABS) 100 MG tablet Take 1 tablet (100 mg total) by mouth 2 (two) times daily. 20 tablet 0  . ezetimibe (ZETIA) 10 MG tablet Take 1 tablet (10 mg total) by mouth daily. (Patient taking differently: Take 10 mg by mouth every evening. ) 90 tablet 1  . gabapentin (NEURONTIN) 800 MG tablet Take 1 tablet (800 mg total) by mouth 4 (four) times daily.    Marland Kitchen glucose blood (ONETOUCH VERIO) test strip Use to check blood sugar 2 times per day. 100 each 12  . insulin NPH-regular Human (70-30) 100  UNIT/ML injection Inject 70 Units into the skin 2 (two) times daily with a meal.    . Insulin Syringe-Needle U-100 (SURE COMFORT INSULIN SYRINGE) 31G X 5/16" 0.5 ML MISC USE AS DIRECTED WITH INSULIN 4 TIMES A DAY 400 each 2  . lamoTRIgine (LAMICTAL) 25 MG tablet TAKE 1 TABLET BY MOUTH TWO  TIMES DAILY 180 tablet 1  . Lancet Devices (ACCU-CHEK SOFTCLIX) lancets Test two times daily 100 each 12  . levothyroxine (SYNTHROID, LEVOTHROID) 75 MCG tablet TAKE 1 TABLET BY MOUTH  DAILY 90 tablet 3  . lisinopril (PRINIVIL,ZESTRIL) 10 MG tablet Take 1 tablet (10 mg total) by mouth daily. 90 tablet 1  . loratadine (CLARITIN) 10 MG tablet Take 10 mg by mouth daily as needed for allergies.    Glory Rosebush DELICA LANCETS 16X MISC TEST TWO TIMES DAILY 200 each 2  . oxyCODONE (ROXICODONE) 15 MG immediate release tablet Take 1 tablet (15 mg total) by mouth every 6 (six) hours as needed for  pain. 120 tablet 0  . rOPINIRole (REQUIP) 0.5 MG tablet Take 1 tablet (0.5 mg total) by mouth at bedtime. 90 tablet 1  . vitamin B-12 (CYANOCOBALAMIN) 1000 MCG tablet Take 1,000 mcg by mouth daily.       No current facility-administered medications for this visit.     Allergies:   Hydromorphone hcl; Cephalexin; Demerol  [meperidine hcl]; Lovastatin; Metformin and related; Sulfamethoxazole-trimethoprim; Crestor [rosuvastatin calcium]; Niacin; and Paroxetine    Social History:  The patient  reports that she has been smoking. She has been smoking about 1.00 pack per day. She has never used smokeless tobacco. She reports that she does not drink alcohol or use drugs.   Family History:  The patient's family history includes Cancer (age of onset: 8) in her father; Diabetes in her mother; Hypertension in her mother; [name] in her brother.    ROS:  Please see the history of present illness.   Otherwise, review of systems are positive for none.   All other systems are reviewed and negative.    PHYSICAL EXAM: VS:  BP 118/72   Pulse 89   Ht _0  (1.753 m)   Wt 98.7 kg   SpO2 98%   BMI 32.13 kg/m  , BMI Body mass index is 32.13 kg/m. Affect appropriate Healthy:  appears stated age 5: normal Neck supple with no adenopathy JVP normal no bruits no thyromegaly Lungs clear with no wheezing and good diaphragmatic motion Heart:  S1/S2 no murmur, no rub, gallop or click PMI normal Abdomen: benighn, BS positve, no tenderness, no AAA no bruit.  No HSM or HJR Distal pulses intact with no bruits No edema Neuro non-focal Skin warm and dry No muscular weakness    EKG:  12/19/18 SR RBBB no acute changes    Recent Labs: 09/28/2018: B Natriuretic Peptide 58.0 10/02/2018: Magnesium 2.5 12/19/2018: ALT 14; BUN 15; Creatinine, Ser 1.03; Hemoglobin 13.7; Platelets 294.0; Potassium 4.2; Sodium 137; TSH 0.51    Lipid Panel    Component Value Date/Time   CHOL 223 (H) 07/19/2016 1519     TRIG (H) 07/19/2016 1519    527.0 Triglyceride is over 400; calculations on Lipids are invalid.   TRIG 400 (HH) 08/28/2006 0948   HDL 27.40 (L) 07/19/2016 1519   CHOLHDL 8 07/19/2016 1519   VLDL 92.0 (H) 07/08/2014 1648   LDLCALC 81 01/02/2013 0942   LDLDIRECT 106.0 07/19/2016 1519      Wt Readings from Last 3 Encounters:  12/31/18  98.7 kg  12/19/18 98 kg  10/29/18 99.8 kg      Other studies Reviewed: Additional studies/ records that were reviewed today include: Notes from primary ECG labs notes from podiatry Dr March Rummage ABI's .    ASSESSMENT AND PLAN:  1.  Chest Pain: atypical with DM will f/u with Lexiscan Myovue 2.  Syncope: doubt cardiac etiology with normal exam and TTE observe  3. DM:  Discussed low carb diet.  Target hemoglobin A1c is 6.5 or less.  Continue current medications. 4. Podiatry:  F/u Dr March Rummage will clear for surgery this Wendsday stress testing more for long term prognosis 5. RBBB: benign yearly ECG no high grade heart block 7. PVD:  Needs LE arterial duplex as ABI's were non diagnostic due to non compressible vessels   Current medicines are reviewed at length with the patient today.  The patient does not have concerns regarding medicines.  The following changes have been made:  no change  Labs/ tests ordered today include: Myovue and LE arterial duplex with ABI"s    Orders Placed This Encounter  Procedures  . Ambulatory referral to Genetics  . MYOCARDIAL PERFUSION IMAGING     Disposition:   FU with cardiology PRN     Signed, Jenkins Rouge, MD  12/31/2018 10:19 AM    [facility] [place], [place], [place]  [address] Phone: [phone]; Fax: [phone]

## 2018-12-26 ENCOUNTER — Ambulatory Visit (HOSPITAL_COMMUNITY): Payer: Medicare Other | Attending: Internal Medicine

## 2018-12-26 DIAGNOSIS — Z01818 Encounter for other preprocedural examination: Secondary | ICD-10-CM

## 2018-12-28 ENCOUNTER — Ambulatory Visit (INDEPENDENT_AMBULATORY_CARE_PROVIDER_SITE_OTHER): Payer: Medicare Other | Admitting: Podiatry

## 2018-12-28 ENCOUNTER — Encounter: Payer: Self-pay | Admitting: Podiatry

## 2018-12-28 DIAGNOSIS — M898X7 Other specified disorders of bone, ankle and foot: Secondary | ICD-10-CM

## 2018-12-28 DIAGNOSIS — M21961 Unspecified acquired deformity of right lower leg: Secondary | ICD-10-CM | POA: Diagnosis not present

## 2018-12-28 DIAGNOSIS — E11621 Type 2 diabetes mellitus with foot ulcer: Secondary | ICD-10-CM

## 2018-12-28 DIAGNOSIS — M7751 Other enthesopathy of right foot: Secondary | ICD-10-CM | POA: Diagnosis not present

## 2018-12-28 DIAGNOSIS — L97429 Non-pressure chronic ulcer of left heel and midfoot with unspecified severity: Secondary | ICD-10-CM

## 2018-12-28 MED ORDER — DOXYCYCLINE HYCLATE 100 MG PO TABS: 100.0000 mg | ORAL_TABLET | Freq: Two times a day (BID) | ORAL | 0 refills | Status: DC

## 2018-12-28 NOTE — Progress Notes (Signed)
  Subjective:  Patient ID: Katherine Herrera, female    DOB: [birth year],  MRN: [id number]  Chief Complaint  Patient presents with  . Wound Check    Pt states bilateral ulcers are draining a lot, and plantar right has worsened. Pt states her AFO brace on right foot is not helping and making the bottom of her foot worse.   53 y.o. female returns for care of her left foot ulcer. History as above.  Objective:   General AA&O x3. Normal mood and affect.  Vascular Foot warm and well perfused.  Neurologic Gross sensation diminished.  Dermatologic Ulcer present to the left fifth metatarsal head measuring 1.5x1.5 post-debridement Pre-ulcerative lesions about the right fifth metatarsal head, fifth metatarsal base  Orthopedic: Tenderness to palpation noted about the fifth metatarsal head area bilaterally, fifth met base right    Assessment & Plan:  Patient was evaluated and treated and all questions answered.  Left foot wound sub-met fifth metatarsal -Planning metatarsal resection with concomitant wound closure at that time and possible adjacent tissue transfer. -Wound debrided as below  Procedure: Selective Debridement of Wound Rationale: Removal of devitalized tissue from the wound to promote healing.  Pre-Debridement Wound Measurements: 1.5 cm x 1.5 cm x 0.2 cm  Post-Debridement Wound Measurements: same as pre-debridement. Type of Debridement: sharp selective Tissue Removed: Devitalized soft-tissue Dressing: Dry, sterile, compression dressing. Disposition: Patient tolerated procedure well. Patient to return in 1 week for follow-up.    Right fifth metatarsal head ulceration -Planning right 5th metatarsal head excision.  Right fifth metatarsal styloid process pre-ulcerative lesion with cavus foot type. -Plan for 5th met base exostectomy   No follow-ups on file.

## 2018-12-28 NOTE — Progress Notes (Signed)
Received pt pcp h&p via fax , placed with chart.

## 2018-12-29 NOTE — Progress Notes (Signed)
  Subjective:  Patient ID: Katherine Herrera, female    DOB: 12-Apr-1966,  MRN: [id number]  Chief Complaint  Patient presents with  . Foot Ulcer    Follow up ulcers bilateral   "They are not good"   53 y.o. female returns for care of her left foot ulcer. History as above.  Objective:   General AA&O x3. Normal mood and affect.  Vascular Foot warm and well perfused.  Neurologic Gross sensation diminished.  Dermatologic   2 x 2 ulceration left fifth MPJ without drainage.  Slight local redness but without warmth   Fifth metatarsal base ulceration 1.5 x 1.5 with granular wound base  Orthopedic: Tenderness to palpation noted about the fifth metatarsal head area bilaterally, fifth met base right    Assessment & Plan:  Patient was evaluated and treated and all questions answered.  Left foot wound sub-met fifth metatarsal -Planning metatarsal resection with concomitant wound closure at that time and possible adjacent tissue transfer. -Wounds debrided as below  Right fifth metatarsal head ulceration -Planning right 5th metatarsal head excision. -Wounds debrided as below.  Right fifth metatarsal styloid process pre-ulcerative lesion with cavus foot type. -Plan for 5th met base exostectomy  Procedure: Selective Debridement of Wound Rationale: Removal of devitalized tissue from the wound to promote healing.  Pre-Debridement Wound Measurements: 1.5 x 1.5, 2 x 2 cm  Post-Debridement Wound Measurements: same as pre-debridement. Type of Debridement: sharp selective Tissue Removed: Devitalized soft-tissue Dressing: Dry, sterile, compression dressing. Disposition: Patient tolerated procedure well. Patient to return in 1 week for follow-up.  Return for post-operatively.

## 2018-12-29 NOTE — H&P (View-Only) (Signed)
  Subjective:  Patient ID: Katherine Herrera, female    DOB: [birth year],  MRN: [id number]  Chief Complaint  Patient presents with  . Foot Ulcer    Follow up ulcers bilateral   "They are not good"   53 y.o. female returns for care of her left foot ulcer. History as above.  Objective:   General AA&O x3. Normal mood and affect.  Vascular Foot warm and well perfused.  Neurologic Gross sensation diminished.  Dermatologic   2 x 2 ulceration left fifth MPJ without drainage.  Slight local redness but without warmth   Fifth metatarsal base ulceration 1.5 x 1.5 with granular wound base  Orthopedic: Tenderness to palpation noted about the fifth metatarsal head area bilaterally, fifth met base right    Assessment & Plan:  Patient was evaluated and treated and all questions answered.  Left foot wound sub-met fifth metatarsal -Planning metatarsal resection with concomitant wound closure at that time and possible adjacent tissue transfer. -Wounds debrided as below  Right fifth metatarsal head ulceration -Planning right 5th metatarsal head excision. -Wounds debrided as below.  Right fifth metatarsal styloid process pre-ulcerative lesion with cavus foot type. -Plan for 5th met base exostectomy  Procedure: Selective Debridement of Wound Rationale: Removal of devitalized tissue from the wound to promote healing.  Pre-Debridement Wound Measurements: 1.5 x 1.5, 2 x 2 cm  Post-Debridement Wound Measurements: same as pre-debridement. Type of Debridement: sharp selective Tissue Removed: Devitalized soft-tissue Dressing: Dry, sterile, compression dressing. Disposition: Patient tolerated procedure well. Patient to return in 1 week for follow-up.  Return for post-operatively.

## 2018-12-31 ENCOUNTER — Other Ambulatory Visit: Payer: Self-pay

## 2018-12-31 ENCOUNTER — Encounter (HOSPITAL_BASED_OUTPATIENT_CLINIC_OR_DEPARTMENT_OTHER): Payer: Self-pay | Admitting: *Deleted

## 2018-12-31 ENCOUNTER — Telehealth: Payer: Self-pay | Admitting: *Deleted

## 2018-12-31 ENCOUNTER — Encounter: Payer: Self-pay | Admitting: Cardiovascular Disease

## 2018-12-31 ENCOUNTER — Ambulatory Visit: Payer: Medicare Other | Admitting: Cardiovascular Disease

## 2018-12-31 VITALS — BP 118/72 | HR 89 | Ht 69.0 in | Wt 217.6 lb

## 2018-12-31 DIAGNOSIS — I4510 Unspecified right bundle-branch block: Secondary | ICD-10-CM | POA: Diagnosis not present

## 2018-12-31 DIAGNOSIS — Q8740 Marfan's syndrome, unspecified: Secondary | ICD-10-CM | POA: Diagnosis not present

## 2018-12-31 DIAGNOSIS — R079 Chest pain, unspecified: Secondary | ICD-10-CM | POA: Diagnosis not present

## 2018-12-31 DIAGNOSIS — R55 Syncope and collapse: Secondary | ICD-10-CM | POA: Diagnosis not present

## 2018-12-31 DIAGNOSIS — I739 Peripheral vascular disease, unspecified: Secondary | ICD-10-CM | POA: Diagnosis not present

## 2018-12-31 NOTE — Progress Notes (Signed)
Npo after midnight, arrive 630 am 01-02-2019 [facility] meds to take sip of water: oxycodone prn, clonazepam prn, gabapentin, lamictal, baclofen, doxycycline, levothyroxine, take 1/2 dose nph insulin pm 01-01-19 Records on chart: h & p dr Alain Marion 12-02-2018 Records on chart/epic: cbc with dif, hepatic function, ua, bmet labs done 12-19-18, echo 12-26-18, ekg 12-19-18, chest xray 12-19-2018 Patient to call dr price about stopping 325 mg aspirin  Daughter Lawson driver Requested surgery orders  Lm with delydia at dr price

## 2018-12-31 NOTE — Patient Instructions (Addendum)
Medication Instructions:   If you need a refill on your cardiac medications before your next appointment, please call your pharmacy.   Lab work:  If you have labs (blood work) drawn today and your tests are completely normal, you will receive your results only by: Marland Kitchen MyChart Message (if you have MyChart) OR . A paper copy in the mail If you have any lab test that is abnormal or we need to change your treatment, we will call you to review the results.  Testing/Procedures:. Your physician has requested that you have a lexiscan myoview. For further information please visit [email or web address]. Please follow instruction sheet, as given.  Your physician has requested that you have a lower extremity arterial duplex. This test is an ultrasound of the arteries in the legs. It looks at arterial blood flow in the legs. Allow one hour for Lower Arterial scans. There are no restrictions or special instructions.  Follow-Up: At [facility], you and your health needs are our priority.  As part of our continuing mission to provide you with exceptional heart care, we have created designated Provider Care Teams.  These Care Teams include your primary Cardiologist (physician) and Advanced Practice Providers (APPs -  Physician Assistants and Nurse Practitioners) who all work together to provide you with the care you need, when you need it. Your physician recommends that you schedule a follow-up appointment as needed with Dr. Johnsie Cancel.  Your physician recommends that you schedule an appointment with Dr. Broadus John.

## 2018-12-31 NOTE — Telephone Encounter (Signed)
"  I'm calling to see if you received my medical clearance from Dr. Alain Marion."  Yes, I can see it in Epic.  "I just wanted to make sure because I know how papers can get lost."

## 2018-12-31 NOTE — Telephone Encounter (Signed)
"  Dr. March Rummage needs to put in some orders for Katherine Herrera.  Her surgery is on 03/04 at the surgery center.  Also this patient has not been instructed to stop her 325 mg Aspirin.  So if you all could let her know about that.  Dr. Alain Marion did give her medical clearance but it was not addressed about the Aspirin, according to the patient."

## 2018-12-31 NOTE — Telephone Encounter (Signed)
Orders put in. I'm ok with her continuing her ASA. Can you call and inform?

## 2019-01-01 ENCOUNTER — Encounter (HOSPITAL_BASED_OUTPATIENT_CLINIC_OR_DEPARTMENT_OTHER): Payer: Self-pay | Admitting: Anesthesiology

## 2019-01-01 NOTE — Anesthesia Preprocedure Evaluation (Addendum)
Anesthesia Evaluation  Patient identified by MRN, date of birth, ID band Patient awake    Reviewed: Allergy & Precautions, NPO status , Patient's Chart, lab work & pertinent test results  Airway Mallampati: I  TM Distance: >3 FB Neck ROM: Full    Dental  (+) Teeth Intact, Dental Advisory Given   Pulmonary COPD, Current Smoker,    breath sounds clear to auscultation       Cardiovascular hypertension, Pt. on medications  Rhythm:Regular Rate:Normal     Neuro/Psych  Headaches, Anxiety    GI/Hepatic negative GI ROS, Neg liver ROS,   Endo/Other  diabetes, Type 2, Oral Hypoglycemic AgentsHypothyroidism   Renal/GU Renal disease     Musculoskeletal  (+) Arthritis ,   Abdominal Normal abdominal exam  (+)   Peds  Hematology   Anesthesia Other Findings - HLD  Reproductive/Obstetrics                            Lab Results  Component Value Date   WBC 10.9 (H) 12/19/2018   HGB 13.7 12/19/2018   HCT 41.9 12/19/2018   MCV 84.8 12/19/2018   PLT 294.0 12/19/2018   Lab Results  Component Value Date   CREATININE 1.03 12/19/2018   BUN 15 12/19/2018   NA 137 12/19/2018   K 4.2 12/19/2018   CL 102 12/19/2018   CO2 28 12/19/2018   Lab Results  Component Value Date   INR 0.95 01/28/2014   Echo: 1. The left ventricle has normal systolic function with an ejection fraction of 60-65%. The cavity size was normal. Left ventricular diastolic parameters were normal.  2. The right ventricle has normal systolic function. The cavity was normal. There is no increase in right ventricular wall thickness.  3. The mitral valve is normal in structure. There is mild mitral annular calcification present.  4. The tricuspid valve is normal in structure.  5. The aortic root and ascending aorta are normal in size and structure.  Anesthesia Physical Anesthesia Plan  ASA: III  Anesthesia Plan: General   Post-op Pain  Management:    Induction: Intravenous  PONV Risk Score and Plan: 3 and Ondansetron, Dexamethasone and Midazolam  Airway Management Planned: LMA  Additional Equipment: None  Intra-op Plan:   Post-operative Plan: Extubation in OR  Informed Consent: I have reviewed the patients History and Physical, chart, labs and discussed the procedure including the risks, benefits and alternatives for the proposed anesthesia with the patient or authorized representative who has indicated his/her understanding and acceptance.     Dental advisory given  Plan Discussed with: CRNA  Anesthesia Plan Comments:        Anesthesia Quick Evaluation

## 2019-01-02 ENCOUNTER — Encounter (HOSPITAL_BASED_OUTPATIENT_CLINIC_OR_DEPARTMENT_OTHER): Payer: Self-pay

## 2019-01-02 ENCOUNTER — Ambulatory Visit (HOSPITAL_COMMUNITY): Payer: Medicare Other

## 2019-01-02 ENCOUNTER — Encounter (HOSPITAL_BASED_OUTPATIENT_CLINIC_OR_DEPARTMENT_OTHER)
Admission: RE | Disposition: A | Discharge status: Home / Self Care | Payer: Self-pay | Source: Home / Self Care | Attending: Podiatry

## 2019-01-02 ENCOUNTER — Ambulatory Visit (HOSPITAL_BASED_OUTPATIENT_CLINIC_OR_DEPARTMENT_OTHER)
Admission: RE | Admit: 2019-01-02 | Discharge: 2019-01-02 | Disposition: A | Discharge status: Home / Self Care | Payer: Medicare Other | Attending: Podiatry | Admitting: Podiatry

## 2019-01-02 ENCOUNTER — Telehealth: Payer: Self-pay | Admitting: Podiatry

## 2019-01-02 ENCOUNTER — Ambulatory Visit (HOSPITAL_BASED_OUTPATIENT_CLINIC_OR_DEPARTMENT_OTHER): Payer: Medicare Other | Admitting: Anesthesiology

## 2019-01-02 DIAGNOSIS — M21962 Unspecified acquired deformity of left lower leg: Secondary | ICD-10-CM

## 2019-01-02 DIAGNOSIS — M216X1 Other acquired deformities of right foot: Secondary | ICD-10-CM | POA: Insufficient documentation

## 2019-01-02 DIAGNOSIS — J449 Chronic obstructive pulmonary disease, unspecified: Secondary | ICD-10-CM | POA: Insufficient documentation

## 2019-01-02 DIAGNOSIS — F17200 Nicotine dependence, unspecified, uncomplicated: Secondary | ICD-10-CM | POA: Insufficient documentation

## 2019-01-02 DIAGNOSIS — L97519 Non-pressure chronic ulcer of other part of right foot with unspecified severity: Secondary | ICD-10-CM | POA: Insufficient documentation

## 2019-01-02 DIAGNOSIS — M1990 Unspecified osteoarthritis, unspecified site: Secondary | ICD-10-CM | POA: Diagnosis not present

## 2019-01-02 DIAGNOSIS — M216X2 Other acquired deformities of left foot: Secondary | ICD-10-CM | POA: Diagnosis not present

## 2019-01-02 DIAGNOSIS — I776 Arteritis, unspecified: Secondary | ICD-10-CM | POA: Insufficient documentation

## 2019-01-02 DIAGNOSIS — L97529 Non-pressure chronic ulcer of other part of left foot with unspecified severity: Secondary | ICD-10-CM | POA: Diagnosis not present

## 2019-01-02 DIAGNOSIS — E08621 Diabetes mellitus due to underlying condition with foot ulcer: Secondary | ICD-10-CM | POA: Diagnosis not present

## 2019-01-02 DIAGNOSIS — E11621 Type 2 diabetes mellitus with foot ulcer: Secondary | ICD-10-CM | POA: Insufficient documentation

## 2019-01-02 DIAGNOSIS — L97511 Non-pressure chronic ulcer of other part of right foot limited to breakdown of skin: Secondary | ICD-10-CM | POA: Diagnosis not present

## 2019-01-02 DIAGNOSIS — Z9889 Other specified postprocedural states: Secondary | ICD-10-CM | POA: Diagnosis not present

## 2019-01-02 DIAGNOSIS — M898X7 Other specified disorders of bone, ankle and foot: Secondary | ICD-10-CM

## 2019-01-02 DIAGNOSIS — M7751 Other enthesopathy of right foot: Secondary | ICD-10-CM

## 2019-01-02 DIAGNOSIS — Z7984 Long term (current) use of oral hypoglycemic drugs: Secondary | ICD-10-CM | POA: Insufficient documentation

## 2019-01-02 DIAGNOSIS — I10 Essential (primary) hypertension: Secondary | ICD-10-CM | POA: Insufficient documentation

## 2019-01-02 DIAGNOSIS — L97422 Non-pressure chronic ulcer of left heel and midfoot with fat layer exposed: Secondary | ICD-10-CM | POA: Diagnosis not present

## 2019-01-02 DIAGNOSIS — E1121 Type 2 diabetes mellitus with diabetic nephropathy: Secondary | ICD-10-CM | POA: Insufficient documentation

## 2019-01-02 HISTORY — DX: Family history of other specified conditions: Z84.89

## 2019-01-02 HISTORY — PX: METATARSAL HEAD EXCISION: SHX5027

## 2019-01-02 HISTORY — DX: Other injury of unspecified body region, initial encounter: T14.8XXA

## 2019-01-02 LAB — GLUCOSE, CAPILLARY
Glucose-Capillary: 226 mg/dL — ABNORMAL HIGH (ref 70–99)
Glucose-Capillary: 284 mg/dL — ABNORMAL HIGH (ref 70–99)

## 2019-01-02 SURGERY — EXCISION, METATARSAL BONE, HEAD
Anesthesia: General | Site: Foot | Laterality: Bilateral

## 2019-01-02 MED ORDER — FENTANYL CITRATE (PF) 100 MCG/2ML IJ SOLN
INTRAMUSCULAR | Status: AC
  Filled 2019-01-02: qty 2

## 2019-01-02 MED ORDER — EPHEDRINE 5 MG/ML INJ
INTRAVENOUS | Status: AC
  Filled 2019-01-02: qty 10

## 2019-01-02 MED ORDER — VANCOMYCIN HCL 1 G IV SOLR
1000.0000 mg | INTRAVENOUS | Status: DC
  Filled 2019-01-02: qty 1000

## 2019-01-02 MED ORDER — MIDAZOLAM HCL 2 MG/2ML IJ SOLN
INTRAMUSCULAR | Status: AC
  Filled 2019-01-02: qty 2

## 2019-01-02 MED ORDER — ACETAMINOPHEN 325 MG PO TABS
325.0000 mg | ORAL_TABLET | Freq: Once | ORAL | Status: DC
  Filled 2019-01-02: qty 2

## 2019-01-02 MED ORDER — PROMETHAZINE HCL 25 MG/ML IJ SOLN
6.2500 mg | INTRAMUSCULAR | Status: DC | PRN
  Filled 2019-01-02: qty 1

## 2019-01-02 MED ORDER — PHENYLEPHRINE 40 MCG/ML (10ML) SYRINGE FOR IV PUSH (FOR BLOOD PRESSURE SUPPORT)
PREFILLED_SYRINGE | INTRAVENOUS | Status: DC | PRN
  Administered 2019-01-02: 120 ug via INTRAVENOUS
  Administered 2019-01-02 (×2): 80 ug via INTRAVENOUS

## 2019-01-02 MED ORDER — MIDAZOLAM HCL 5 MG/5ML IJ SOLN
INTRAMUSCULAR | Status: DC | PRN
  Administered 2019-01-02: 2 mg via INTRAVENOUS

## 2019-01-02 MED ORDER — VANCOMYCIN HCL 1000 MG IV SOLR
INTRAVENOUS | Status: DC | PRN
  Administered 2019-01-02: 1000 mg via TOPICAL

## 2019-01-02 MED ORDER — CLINDAMYCIN PHOSPHATE 900 MG/50ML IV SOLN
INTRAVENOUS | Status: AC
  Filled 2019-01-02: qty 50

## 2019-01-02 MED ORDER — ACETAMINOPHEN 160 MG/5ML PO SOLN
325.0000 mg | Freq: Once | ORAL | Status: DC
  Filled 2019-01-02: qty 20.3

## 2019-01-02 MED ORDER — FENTANYL CITRATE (PF) 100 MCG/2ML IJ SOLN
INTRAMUSCULAR | Status: DC | PRN
  Administered 2019-01-02 (×2): 50 ug via INTRAVENOUS

## 2019-01-02 MED ORDER — LACTATED RINGERS IV SOLN
INTRAVENOUS | Status: DC
  Administered 2019-01-02: 12:00:00 via INTRAVENOUS
  Filled 2019-01-02: qty 1000

## 2019-01-02 MED ORDER — ONDANSETRON HCL 4 MG/2ML IJ SOLN
INTRAMUSCULAR | Status: AC
  Filled 2019-01-02: qty 2

## 2019-01-02 MED ORDER — PHENYLEPHRINE 40 MCG/ML (10ML) SYRINGE FOR IV PUSH (FOR BLOOD PRESSURE SUPPORT)
PREFILLED_SYRINGE | INTRAVENOUS | Status: AC
  Filled 2019-01-02: qty 10

## 2019-01-02 MED ORDER — PROPOFOL 10 MG/ML IV BOLUS
INTRAVENOUS | Status: DC | PRN
  Administered 2019-01-02: 200 mg via INTRAVENOUS

## 2019-01-02 MED ORDER — EPHEDRINE SULFATE 50 MG/ML IJ SOLN
INTRAMUSCULAR | Status: DC | PRN
  Administered 2019-01-02: 15 mg via INTRAVENOUS
  Administered 2019-01-02 (×2): 10 mg via INTRAVENOUS

## 2019-01-02 MED ORDER — BUPIVACAINE HCL (PF) 0.5 % IJ SOLN
INTRAMUSCULAR | Status: DC | PRN
  Administered 2019-01-02: 20 mL

## 2019-01-02 MED ORDER — CLINDAMYCIN PHOSPHATE 900 MG/50ML IV SOLN
900.0000 mg | INTRAVENOUS | Status: AC
  Administered 2019-01-02: 900 mg via INTRAVENOUS
  Filled 2019-01-02: qty 50

## 2019-01-02 MED ORDER — FENTANYL CITRATE (PF) 100 MCG/2ML IJ SOLN
25.0000 ug | INTRAMUSCULAR | Status: DC | PRN
  Administered 2019-01-02 (×2): 25 ug via INTRAVENOUS
  Administered 2019-01-02 (×2): 50 ug via INTRAVENOUS
  Filled 2019-01-02: qty 1

## 2019-01-02 MED ORDER — OXYCODONE HCL 5 MG PO TABS
5.0000 mg | ORAL_TABLET | Freq: Once | ORAL | Status: DC | PRN
  Filled 2019-01-02: qty 1

## 2019-01-02 MED ORDER — OXYCODONE HCL 5 MG/5ML PO SOLN
5.0000 mg | Freq: Once | ORAL | Status: DC | PRN
  Filled 2019-01-02: qty 5

## 2019-01-02 MED ORDER — LIDOCAINE 2% (20 MG/ML) 5 ML SYRINGE
INTRAMUSCULAR | Status: AC
  Filled 2019-01-02: qty 5

## 2019-01-02 MED ORDER — DEXAMETHASONE SODIUM PHOSPHATE 10 MG/ML IJ SOLN
INTRAMUSCULAR | Status: AC
  Filled 2019-01-02: qty 1

## 2019-01-02 MED ORDER — SODIUM CHLORIDE 0.9 % IV SOLN
INTRAVENOUS | Status: DC | PRN
  Administered 2019-01-02: 50 ug/min via INTRAVENOUS

## 2019-01-02 MED ORDER — ONDANSETRON HCL 4 MG/2ML IJ SOLN
INTRAMUSCULAR | Status: DC | PRN
  Administered 2019-01-02: 4 mg via INTRAVENOUS

## 2019-01-02 MED ORDER — DEXAMETHASONE SODIUM PHOSPHATE 10 MG/ML IJ SOLN
INTRAMUSCULAR | Status: DC | PRN
  Administered 2019-01-02: 5 mg via INTRAVENOUS

## 2019-01-02 MED ORDER — PROPOFOL 500 MG/50ML IV EMUL
INTRAVENOUS | Status: AC
  Filled 2019-01-02: qty 50

## 2019-01-02 MED ORDER — OXYCODONE-ACETAMINOPHEN 10-325 MG PO TABS: 1.0000 | ORAL_TABLET | ORAL | 0 refills | Status: DC | PRN

## 2019-01-02 MED ORDER — ACETAMINOPHEN 10 MG/ML IV SOLN
INTRAVENOUS | Status: AC
  Filled 2019-01-02: qty 100

## 2019-01-02 MED ORDER — LACTATED RINGERS IV SOLN
INTRAVENOUS | Status: DC
  Administered 2019-01-02 (×2): via INTRAVENOUS
  Filled 2019-01-02: qty 1000

## 2019-01-02 MED ORDER — ACETAMINOPHEN 10 MG/ML IV SOLN
1000.0000 mg | Freq: Once | INTRAVENOUS | Status: DC | PRN
  Administered 2019-01-02: 1000 mg via INTRAVENOUS
  Filled 2019-01-02: qty 100

## 2019-01-02 MED ORDER — LIDOCAINE 2% (20 MG/ML) 5 ML SYRINGE
INTRAMUSCULAR | Status: DC | PRN
  Administered 2019-01-02: 80 mg via INTRAVENOUS

## 2019-01-02 SURGICAL SUPPLY — 55 items
BANDAGE ACE 4X5 VEL STRL LF (GAUZE/BANDAGES/DRESSINGS) ×2 IMPLANT
BANDAGE ACE 6X5 VEL STRL LF (GAUZE/BANDAGES/DRESSINGS) ×1 IMPLANT
BANDAGE ELASTIC 4 VELCRO ST LF (GAUZE/BANDAGES/DRESSINGS) ×2 IMPLANT
BLADE SURG 10 STRL SS (BLADE) IMPLANT
BLADE SURG 15 STRL LF DISP TIS (BLADE) ×1 IMPLANT
BLADE SURG 15 STRL SS (BLADE) ×2
BNDG GAUZE ELAST 4 BULKY (GAUZE/BANDAGES/DRESSINGS) ×2 IMPLANT
CONT SPECI 4OZ STER CLIK (MISCELLANEOUS) ×2 IMPLANT
COVER BACK TABLE 60X90IN (DRAPES) ×2 IMPLANT
COVER SURGICAL LIGHT HANDLE (MISCELLANEOUS) ×2 IMPLANT
COVER WAND RF STERILE (DRAPES) ×3 IMPLANT
CUFF TOURN SGL QUICK 24 (TOURNIQUET CUFF) ×2
CUFF TRNQT CYL 24X4X40X1 (TOURNIQUET CUFF) ×1 IMPLANT
DECANTER SPIKE VIAL GLASS SM (MISCELLANEOUS) ×2 IMPLANT
DRAPE EXTREMITY BILATERAL (DRAPES) ×1 IMPLANT
DRAPE EXTREMITY T 121X128X90 (DISPOSABLE) ×2 IMPLANT
GAUZE 4X4 16PLY RFD (DISPOSABLE) IMPLANT
GAUZE SPONGE 4X4 12PLY STRL (GAUZE/BANDAGES/DRESSINGS) ×4 IMPLANT
GAUZE XEROFORM 1X8 LF (GAUZE/BANDAGES/DRESSINGS) ×2 IMPLANT
GLOVE BIO SURGEON STRL SZ7.5 (GLOVE) ×2 IMPLANT
GLOVE BIOGEL PI IND STRL 6.5 (GLOVE) IMPLANT
GLOVE BIOGEL PI IND STRL 7.5 (GLOVE) IMPLANT
GLOVE BIOGEL PI IND STRL 8 (GLOVE) ×1 IMPLANT
GLOVE BIOGEL PI INDICATOR 6.5 (GLOVE) ×1
GLOVE BIOGEL PI INDICATOR 7.5 (GLOVE) ×1
GLOVE BIOGEL PI INDICATOR 8 (GLOVE) ×1
GLOVE ECLIPSE 6.5 STRL STRAW (GLOVE) ×1 IMPLANT
GOWN SPEC L3 XXLG W/TWL (GOWN DISPOSABLE) ×1 IMPLANT
GOWN STRL REUS W/TWL XL LVL3 (GOWN DISPOSABLE) ×2 IMPLANT
NDL HYPO 25X1 1.5 SAFETY (NEEDLE) ×1 IMPLANT
NEEDLE HYPO 25X1 1.5 SAFETY (NEEDLE) ×2 IMPLANT
PACK BASIN DAY SURGERY FS (CUSTOM PROCEDURE TRAY) ×2 IMPLANT
PACK ORTHO EXTREMITY (CUSTOM PROCEDURE TRAY) ×1 IMPLANT
PADDING UNDERCAST 2 STRL (CAST SUPPLIES)
PADDING UNDERCAST 2X4 STRL (CAST SUPPLIES) ×1 IMPLANT
PENCIL BUTTON HOLSTER BLD 10FT (ELECTRODE) ×2 IMPLANT
SOL PREP PROV IODINE SCRUB 4OZ (MISCELLANEOUS) ×4 IMPLANT
SPONGE LAP 4X18 RFD (DISPOSABLE) ×2 IMPLANT
STAPLER VISISTAT 35W (STAPLE) ×2 IMPLANT
SUT MNCRL AB 3-0 PS2 18 (SUTURE) ×1 IMPLANT
SUT MNCRL AB 4-0 PS2 18 (SUTURE) ×1 IMPLANT
SUT MON AB 2-0 CT1 36 (SUTURE) ×1 IMPLANT
SUT MON AB 3-0 SH 27 (SUTURE) ×2
SUT MON AB 3-0 SH27 (SUTURE) IMPLANT
SUT PROLENE 4 0 PS 2 18 (SUTURE) ×2 IMPLANT
SUT VIC AB 1 CT1 27 (SUTURE)
SUT VIC AB 1 CT1 27XBRD ANTBC (SUTURE) ×1 IMPLANT
SWAB CULTURE ESWAB REG 1ML (MISCELLANEOUS) ×1 IMPLANT
SYR BULB IRRIGATION 50ML (SYRINGE) ×2 IMPLANT
SYRINGE 20CC LL (MISCELLANEOUS) ×2 IMPLANT
TOWEL OR 17X26 10 PK STRL BLUE (TOWEL DISPOSABLE) ×2 IMPLANT
TRAY DSU PREP LF (CUSTOM PROCEDURE TRAY) ×2 IMPLANT
TUBE CONNECTING 12X1/4 (SUCTIONS) ×2 IMPLANT
UNDERPAD 30X30 (UNDERPADS AND DIAPERS) ×8 IMPLANT
YANKAUER SUCT BULB TIP NO VENT (SUCTIONS) ×2 IMPLANT

## 2019-01-02 NOTE — Brief Op Note (Signed)
01/02/2019  10:20 AM  PATIENT:  Katherine Herrera  53 y.o. female  PRE-OPERATIVE DIAGNOSIS:  diabetic ulcer left foot, capsulitis joint bilateral foot  POST-OPERATIVE DIAGNOSIS:  diabetic ulcer left foot, capsulitis joint bilateral foot  PROCEDURE:  Procedure(s): Left foot 5th metatarsal resection, wound excision, adjacent tissue transfer. Right fifth metatarsal head resection, fitfth metatarsal base resection, intermediate wound repair. (Bilateral)  SURGEON:  Surgeon(s) and Role:    * Evelina Bucy, DPM - Primary  PHYSICIAN ASSISTANT:   ASSISTANTS: none   ANESTHESIA:   local and MAC  EBL:  5 ml   BLOOD ADMINISTERED:none  DRAINS: none   LOCAL MEDICATIONS USED:  MARCAINE    and Amount: 20 ml  SPECIMEN:   ID Type Source Tests Collected by Time Destination  A : left foot bone culture Tissue Bone AEROBIC/ANAEROBIC CULTURE (SURGICAL/DEEP WOUND) Evelina Bucy, DPM 01/02/2019 5520   B : right foot culture swab Tissue Bone AEROBIC/ANAEROBIC CULTURE (SURGICAL/DEEP WOUND) Evelina Bucy, DPM 01/02/2019 0944       DISPOSITION OF SPECIMEN:  micro  COUNTS:  YES  TOURNIQUET:   Total Tourniquet Time Documented: Calf (Left) - 40 minutes Total: Calf (Left) - 40 minutes  Calf (Right) - 41 minutes Total: Calf (Right) - 41 minutes   DICTATION: .Note written in EPIC  PLAN OF CARE: Discharge to home after PACU  PATIENT DISPOSITION:  PACU - hemodynamically stable.   Delay start of Pharmacological VTE agent (>24hrs) due to surgical blood loss or risk of bleeding: not applicable

## 2019-01-02 NOTE — Transfer of Care (Signed)
Immediate Anesthesia Transfer of Care Note  Patient: Katherine Herrera  Procedure(s) Performed: Left foot 5th metatarsal resection, wound excision, adjacent tissue transfer. Right fifth metatarsal head resection, fitfth metatarsal base resection, intermediate wound repair. (Bilateral Foot)  Patient Location: PACU  Anesthesia Type:General  Level of Consciousness: awake  Airway & Oxygen Therapy: Patient Spontanous Breathing and Patient connected to nasal cannula oxygen  Post-op Assessment: Report given to RN and Post -op Vital signs reviewed and stable  Post vital signs: Reviewed and stable  Last Vitals:  Vitals Value Taken Time  BP    Temp    Pulse    Resp    SpO2      Last Pain:  Vitals:   01/02/19 0728  TempSrc:   PainSc: 0-No pain      Patients Stated Pain Goal: 8 (99/27/80 0447)  Complications: No apparent anesthesia complications

## 2019-01-02 NOTE — Discharge Instructions (Signed)

## 2019-01-02 NOTE — Telephone Encounter (Signed)
Katherine Herrera, pharmacist called in regards to prescriptions received by Dr. March Rummage today for oxycodone and acetaminophen 10-325. Insurance said this is a very high dose as she just got a dose of oxycodone 15 mg from Dr. Alain Marion on 2/24. Calling to see if pt is supposed to be on both of these.

## 2019-01-02 NOTE — Addendum Note (Signed)
Addended by: Harriett Sine D on: 01/02/2019 04:43 PM   Modules accepted: Orders

## 2019-01-02 NOTE — Interval H&P Note (Signed)
History and Physical Interval Note:  01/02/2019 8:06 AM  Katherine Herrera  has presented today for surgery, with the diagnosis of diabetic ulcer left foot, capsulitis joint bilateral foot  The various methods of treatment have been discussed with the patient and family. After consideration of risks, benefits and other options for treatment, the patient has consented to  Procedure(s): METATARSAL HEAD EXCISION FIFTH RIGHT TOE AND FIFTH LEFT TOE, POSSIBLE OSTECTOMY FIFTH RIGHT TOE, POSSIBLE WOUND EXCISION AND CLOSURE INCLUDING ADJACENT SKIN TRANSFER IF NEEDED (Bilateral) as a surgical intervention .  The patient's history has been reviewed, patient examined, no change in status, stable for surgery.  I have reviewed the patient's chart and labs.  Questions were answered to the patient's satisfaction.     Evelina Bucy

## 2019-01-02 NOTE — Telephone Encounter (Signed)
Tracey with [facility] called wanting to speak with Dr. March Rummage with postoperative questions regarding Ms. Winer.   Per Dr. March Rummage, he called and spoke to [facility] and confirmed no other medication changes other than patients pain medications.

## 2019-01-02 NOTE — Anesthesia Postprocedure Evaluation (Addendum)
Anesthesia Post Note  Patient: Katherine Herrera  Procedure(s) Performed: Left foot 5th metatarsal resection, wound excision, adjacent tissue transfer. Right fifth metatarsal head resection, fitfth metatarsal base resection, intermediate wound repair. (Bilateral Foot)     Patient location during evaluation: PACU Anesthesia Type: General Level of consciousness: awake and alert Pain management: pain level controlled Vital Signs Assessment: post-procedure vital signs reviewed and stable Respiratory status: spontaneous breathing, nonlabored ventilation, respiratory function stable and patient connected to nasal cannula oxygen Cardiovascular status: blood pressure returned to baseline and stable Postop Assessment: no apparent nausea or vomiting Anesthetic complications: no Comments: Occasional desaturations in PACU to 85% on RA. Will continue monitor. Pt does not use supplemental O2 at home. If desaturations continue, pt will need to stay overnight for observation. Dr. March Rummage has been notified by RN.     Last Vitals:  Vitals:   01/02/19 1130 01/02/19 1145  BP: 110/60 (!) 106/58  Pulse: 92 92  Resp: 11 13  Temp:    SpO2: 92% 94%    Last Pain:  Vitals:   01/02/19 1145  TempSrc:   PainSc: 3                  Effie Berkshire

## 2019-01-02 NOTE — Addendum Note (Signed)
Addendum  created 01/02/19 1347 by Effie Berkshire, MD   Clinical Note Signed

## 2019-01-02 NOTE — Anesthesia Procedure Notes (Signed)
Procedure Name: LMA Insertion Date/Time: 01/02/2019 8:32 AM Performed by: Lieutenant Diego, CRNA Pre-anesthesia Checklist: Patient identified, Emergency Drugs available, Suction available and Patient being monitored Patient Re-evaluated:Patient Re-evaluated prior to induction Oxygen Delivery Method: Circle system utilized Preoxygenation: Pre-oxygenation with 100% oxygen Induction Type: IV induction Ventilation: Mask ventilation without difficulty LMA: LMA inserted LMA Size: 4.0 Number of attempts: 1 Airway Equipment and Method: Bite block Placement Confirmation: positive ETCO2 and breath sounds checked- equal and bilateral Tube secured with: Tape Dental Injury: Teeth and Oropharynx as per pre-operative assessment

## 2019-01-02 NOTE — Telephone Encounter (Signed)
Dr. March Rummage states discontinue the oxycodone 10/325, the Oxycodone 15mg  may take care of pt's pain. I spoke with Maudie Mercury - [facility] [phone] and informed of Dr. Eleanora Neighbor discontinuing the Oxycodone 10/325mg .

## 2019-01-03 ENCOUNTER — Encounter (HOSPITAL_BASED_OUTPATIENT_CLINIC_OR_DEPARTMENT_OTHER): Payer: Self-pay | Admitting: Podiatry

## 2019-01-03 ENCOUNTER — Other Ambulatory Visit: Payer: Self-pay | Admitting: Podiatry

## 2019-01-03 ENCOUNTER — Telehealth: Payer: Self-pay | Admitting: Podiatry

## 2019-01-03 MED ORDER — OXYCODONE HCL 5 MG PO TABS: 5.0000 mg | ORAL_TABLET | ORAL | 0 refills | Status: DC | PRN

## 2019-01-03 NOTE — Telephone Encounter (Signed)
Called pt post-surgery; pt stated, "In a little bit of pain, tried to get pain medication Oxy that was prescribed but think it was sent to wrong pharmacy." Informed pt I would talk to Dr. And see what was going on. She stated, "I have some dried up blood on bandage"; I asked if bandage was still in tact, she stated, "yes, I haven't touched it". Informed pt I would see about her medication and call back.

## 2019-01-03 NOTE — Telephone Encounter (Signed)
I called Katherine Herrera, Katherine Herrera states her regular pain medication is not covering the shooting pain she is having. I told Katherine Herrera I would inform Dr. March Rummage and call again.

## 2019-01-03 NOTE — Telephone Encounter (Signed)
Sent!

## 2019-01-03 NOTE — Telephone Encounter (Signed)
I informed pt the added pain medication had been sent and pt asked if it had been sent to [facility]. I told her no it had been sent to a [employer], but I would have Dr. March Rummage change to the [facility].

## 2019-01-03 NOTE — Telephone Encounter (Signed)
Pt called again following up on her request for medication.

## 2019-01-03 NOTE — Telephone Encounter (Signed)
Pt had surgery yesterday and was prescribed pain medication but when they went to the pharmacy to pick up the medication they stated the prescription was inactive. Please give pts daughter a call back.

## 2019-01-04 ENCOUNTER — Telehealth: Payer: Self-pay

## 2019-01-04 NOTE — Telephone Encounter (Signed)
POST OP CALL-    1) General condition stated by the patient:   2) Is the pt having pain? Some  3) Pain score:   4) Has the pt taken Rx'd pain medication, regularly or PRN?    5) Is the pain medication giving relief? Yes  6) Any fever, chills, nausea, or vomiting, shortness of breath or tightness in calf? No  7) Is the bandage clean, dry and intact? Yes  8) Is there excessive tightness, bleeding or drainage coming through the bandage?  9) Did you understand all of the post op instruction sheet given?  10) Any questions or concerns regarding post op care/recovery?    Confirmed POV appointment with patient   Spoke with patients daughter

## 2019-01-07 ENCOUNTER — Other Ambulatory Visit: Payer: Self-pay | Admitting: Podiatry

## 2019-01-07 LAB — AEROBIC/ANAEROBIC CULTURE W GRAM STAIN (SURGICAL/DEEP WOUND)
Culture: NORMAL
Gram Stain: NONE SEEN

## 2019-01-07 MED ORDER — CLINDAMYCIN HCL 300 MG PO CAPS: 300.0000 mg | ORAL_CAPSULE | Freq: Two times a day (BID) | ORAL | 0 refills | Status: DC

## 2019-01-10 ENCOUNTER — Other Ambulatory Visit: Payer: Self-pay

## 2019-01-10 ENCOUNTER — Encounter: Payer: Self-pay | Admitting: Podiatry

## 2019-01-10 ENCOUNTER — Ambulatory Visit (INDEPENDENT_AMBULATORY_CARE_PROVIDER_SITE_OTHER): Payer: Medicare Other | Admitting: Podiatry

## 2019-01-10 VITALS — BP 120/69 | HR 72 | Temp 97.4°F

## 2019-01-10 DIAGNOSIS — Z9889 Other specified postprocedural states: Secondary | ICD-10-CM

## 2019-01-10 MED ORDER — HYDROMORPHONE HCL 2 MG PO TABS: 2.0000 mg | ORAL_TABLET | ORAL | 0 refills | Status: DC | PRN

## 2019-01-15 ENCOUNTER — Other Ambulatory Visit: Payer: Self-pay | Admitting: Cardiovascular Disease

## 2019-01-15 DIAGNOSIS — I739 Peripheral vascular disease, unspecified: Secondary | ICD-10-CM

## 2019-01-17 ENCOUNTER — Ambulatory Visit (HOSPITAL_COMMUNITY): Payer: Medicare Other

## 2019-01-18 ENCOUNTER — Other Ambulatory Visit: Payer: Self-pay

## 2019-01-18 ENCOUNTER — Ambulatory Visit (INDEPENDENT_AMBULATORY_CARE_PROVIDER_SITE_OTHER): Payer: Self-pay | Admitting: Podiatry

## 2019-01-18 DIAGNOSIS — Z09 Encounter for follow-up examination after completed treatment for conditions other than malignant neoplasm: Secondary | ICD-10-CM

## 2019-01-18 DIAGNOSIS — E11621 Type 2 diabetes mellitus with foot ulcer: Secondary | ICD-10-CM

## 2019-01-18 DIAGNOSIS — L97429 Non-pressure chronic ulcer of left heel and midfoot with unspecified severity: Secondary | ICD-10-CM

## 2019-01-18 DIAGNOSIS — M898X7 Other specified disorders of bone, ankle and foot: Secondary | ICD-10-CM

## 2019-01-21 NOTE — Progress Notes (Signed)
Subjective:   Patient ID: Katherine Herrera, female   DOB: 53 y.o.   MRN: [id number]   HPI Patient states doing okay and states that here for check   ROS      Objective:  Physical Exam  Neurovascular status unchanged with crusted tissue lateral side right fifth metatarsal with previous surgery was performed by Dr. 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 March Rummage     Plan:  Tissue looks healthy of the time and at this point I went ahead and reapplied sterile dressing instructed on continued immobilization and reappoint for evaluation by Dr. March Rummage and if any changes were to occur inform us immediately

## 2019-01-24 ENCOUNTER — Other Ambulatory Visit: Payer: Self-pay

## 2019-01-24 ENCOUNTER — Ambulatory Visit (HOSPITAL_COMMUNITY): Payer: Medicare Other

## 2019-01-24 ENCOUNTER — Ambulatory Visit (INDEPENDENT_AMBULATORY_CARE_PROVIDER_SITE_OTHER): Payer: Medicare Other | Admitting: Podiatry

## 2019-01-24 DIAGNOSIS — Z9889 Other specified postprocedural states: Secondary | ICD-10-CM

## 2019-01-28 NOTE — Progress Notes (Signed)
Subjective:  Patient ID: Katherine Herrera, female    DOB: [birth year],  MRN: [id number]  Chief Complaint  Patient presents with  . Routine Post Op    DOS 3.4.2020 MT head res 5th. Pt states healing well with no concerns. Pt states some pain and swelling still, but is minor. Pt denies fever/nausea/vomiting/chills.   53 y.o. female returns for post-op check. Doing well only having some minor pain.  Review of Systems: Negative except as noted in the HPI. Denies N/V/F/Ch.  Past Medical History:  Diagnosis Date  . Anxiety   . COPD (chronic obstructive pulmonary disease) ([place])   . DM (diabetes mellitus) ([place])    type 2  . Family history of adverse reaction to anesthesia    mother hard to wake up  . Gout    possible  . Hyperlipidemia   . Hypothyroidism   . LBP (low back pain)    DR Patrice Paradise  . Migraine   . Peripheral neuropathy   . Syncope    poss conversion disorder vs psychogenic seizures  . Tachycardia    diet coke overuse?  Marland Kitchen Tenosynovitis of foot and ankle 09/03/2013  . TTS (tarsal tunnel syndrome) 2010   Left Foot [place] in [place]  . Vitamin D deficiency   . Wound, open    both feet outer parts no drainage, changes dressing 1 day, wounds dime size for last month    Current Outpatient Medications:  .  aspirin (BAYER ASPIRIN) 325 MG tablet, Take 1 tablet (325 mg total) by mouth daily., Disp: 100 tablet, Rfl: 3 .  B-D INS SYR ULTRAFINE 1CC/31G 31G X 5/16" 1 ML MISC, USE AS DIRECTED, Disp: 100 each, Rfl: 0 .  baclofen (LIORESAL) 10 MG tablet, Take 2 tablets (20 mg total) by mouth 2 (two) times daily., Disp: 180 tablet, Rfl: 1 .  Blood Glucose Monitoring Suppl (ONETOUCH VERIO) w/Device KIT, , Disp: , Rfl:  .  cholecalciferol (VITAMIN D) 25 MCG (1000 UT) tablet, Take 2 tablets (2,000 Units total) by mouth daily., Disp: 100 tablet, Rfl: 3 .  clindamycin (CLEOCIN) 300 MG capsule, Take 1 capsule (300 mg total) by mouth 2 (two) times daily., Disp: 14 capsule, Rfl: 0 .  clonazePAM  (KLONOPIN) 0.5 MG tablet, Take 1 tablet (0.5 mg total) by mouth 2 (two) times daily as needed for anxiety., Disp: , Rfl:  .  docusate sodium (COLACE) 100 MG capsule, Take 200 mg by mouth every evening., Disp: , Rfl:  .  doxycycline (VIBRA-TABS) 100 MG tablet, Take 1 tablet (100 mg total) by mouth 2 (two) times daily., Disp: 20 tablet, Rfl: 0 .  ezetimibe (ZETIA) 10 MG tablet, Take 1 tablet (10 mg total) by mouth daily. (Patient taking differently: Take 10 mg by mouth every evening. ), Disp: 90 tablet, Rfl: 1 .  gabapentin (NEURONTIN) 800 MG tablet, Take 1 tablet (800 mg total) by mouth 4 (four) times daily., Disp: , Rfl:  .  glucose blood (ONETOUCH VERIO) test strip, Use to check blood sugar 2 times per day., Disp: 100 each, Rfl: 12 .  HYDROmorphone (DILAUDID) 2 MG tablet, Take 1 tablet (2 mg total) by mouth every 4 (four) hours as needed for severe pain., Disp: 20 tablet, Rfl: 0 .  insulin NPH-regular Human (70-30) 100 UNIT/ML injection, Inject 70 Units into the skin 2 (two) times daily with a meal., Disp: , Rfl:  .  Insulin Syringe-Needle U-100 (SURE COMFORT INSULIN SYRINGE) 31G X 5/16" 0.5 ML MISC, USE AS DIRECTED WITH  INSULIN 4 TIMES A DAY, Disp: 400 each, Rfl: 2 .  lamoTRIgine (LAMICTAL) 25 MG tablet, TAKE 1 TABLET BY MOUTH TWO  TIMES DAILY, Disp: 180 tablet, Rfl: 1 .  Lancet Devices (ACCU-CHEK SOFTCLIX) lancets, Test two times daily, Disp: 100 each, Rfl: 12 .  levothyroxine (SYNTHROID, LEVOTHROID) 75 MCG tablet, TAKE 1 TABLET BY MOUTH  DAILY, Disp: 90 tablet, Rfl: 3 .  lisinopril (PRINIVIL,ZESTRIL) 10 MG tablet, Take 1 tablet (10 mg total) by mouth daily., Disp: 90 tablet, Rfl: 1 .  loratadine (CLARITIN) 10 MG tablet, Take 10 mg by mouth daily as needed for allergies., Disp: , Rfl:  .  ONETOUCH DELICA LANCETS 84R MISC, TEST TWO TIMES DAILY, Disp: 200 each, Rfl: 2 .  oxyCODONE (ROXICODONE) 15 MG immediate release tablet, Take 1 tablet (15 mg total) by mouth every 6 (six) hours as needed for  pain., Disp: 120 tablet, Rfl: 0 .  oxyCODONE (ROXICODONE) 5 MG immediate release tablet, Take 1 tablet (5 mg total) by mouth every 4 (four) hours as needed for severe pain., Disp: 30 tablet, Rfl: 0 .  rOPINIRole (REQUIP) 0.5 MG tablet, Take 1 tablet (0.5 mg total) by mouth at bedtime., Disp: 90 tablet, Rfl: 1 .  vitamin B-12 (CYANOCOBALAMIN) 1000 MCG tablet, Take 1,000 mcg by mouth daily.  , Disp: , Rfl:   Social History   Tobacco Use  Smoking Status Current Every Day Smoker  . Packs/day: 1.00  Smokeless Tobacco Never Used    Allergies  Allergen Reactions  . Hydromorphone Hcl Itching  . Cephalexin Nausea And Vomiting  . Demerol  [Meperidine Hcl] Rash  . Lovastatin Other (See Comments)    Other reaction(s): Unknown (See Comments) Possible myalgia Unknown   . Metformin And Related Nausea And Vomiting  . Sulfamethoxazole-Trimethoprim     Other reaction(s): Unknown DILI, pancreatitis  . Crestor [Rosuvastatin Calcium]     myalgia  . Niacin Other (See Comments)    Unknown   . Paroxetine Other (See Comments)    Other reaction(s): Unknown (See Comments) unknown Unknown    Objective:  There were no vitals filed for this visit. There is no height or weight on file to calculate BMI. Constitutional Well developed. Well nourished.  Vascular Foot warm and well perfused. Capillary refill normal to all digits.   Neurologic Normal speech. Oriented to person, place, and time. Epicritic sensation to light touch grossly present bilaterally.  Dermatologic Skin healing well without signs of infection. Skin edges well coapted without signs of infection.  Orthopedic: Tenderness to palpation noted about the surgical site.   Radiographs: None Assessment:   1. Post-operative state    Plan:  Patient was evaluated and treated and all questions answered.  S/p foot surgery bilaterally -Progressing as expected post-operatively. -XR: none -WB Status: WBAT in surgical shoes -Sutures: all  sutures and staples removed. -Medications: none refilled. -Foot redressed.  Return in about 4 weeks (around 02/21/2019) for Post-op.

## 2019-01-29 ENCOUNTER — Ambulatory Visit: Payer: Medicare Other | Admitting: Internal Medicine

## 2019-01-31 ENCOUNTER — Ambulatory Visit: Payer: Medicare Other | Admitting: Internal Medicine

## 2019-02-05 NOTE — Op Note (Signed)
Patient Name: Katherine Herrera DOB: July 09, 1966  MRN: [id number]   Date of Service: 01/02/2019  Surgeon: Dr. Hardie Pulley, DPM Assistants: None Pre-operative Diagnosis:  Ulcer of Left Foot Ulcer of Right Foot Metatarsal Deformity Left Metatarsal Deformity Right Post-operative Diagnosis:  same Procedures:  1) Left 5th Metatarsal Resection  2) Excision of Ulcer  3) Adjacent tissue Transfer left foot  4) Right 5th Metatarsal Head Resection  5) Right 5th Metatarsal Base Exostectomy  6) Intermediate Wound Repair Right Pathology/Specimens: ID Type Source Tests Collected by Time Destination  A : left foot bone culture Tissue Bone AEROBIC/ANAEROBIC CULTURE (SURGICAL/DEEP WOUND) Evelina Bucy, DPM 01/02/2019 0321   B : right foot culture swab Tissue Bone AEROBIC/ANAEROBIC CULTURE (SURGICAL/DEEP WOUND) Evelina Bucy, DPM 01/02/2019 0944    Anesthesia: MAC/local Hemostasis:  Total Tourniquet Time Documented: Calf (Left) - 40 minutes Total: Calf (Left) - 40 minutes  Calf (Right) - 41 minutes Total: Calf (Right) - 41 minutes  Estimated Blood Loss: 10 ml Materials: * No implants in log * Medications: None Complications: None  Indications for Procedure:  This is a 53 y.o. female with chronic wounds to both feet.    Procedure in Detail: Patient was identified in pre-operative holding area. Formal consent was signed and both lower extremity were marked. Patient was brought back to the operating room. Anesthesia was induced. The extremity was prepped and draped in the usual sterile fashion. Timeout was taken to confirm patient name, laterality, and procedure prior to incision.   Left 5th metatarsal resection Attention was then directed to the left fifth metatarsal area.  There is ulceration measuring 2 x 2 without probe to bone.  An incision was made about the lateral aspect of the fifth metatarsal phalangeal joint.  Dissection was continued down to level of the fifth metatarsal capsule.   Care was taken avoid all vital neurovascular structures all bleeders were cauterized with electrocautery as needed.  Linear incision made the fifth metatarsal capsule and the periosteum was gently freed from the bone.  Sagittal saw was used to resect a portion of the remaining fifth metatarsal distally.  A small portion of the bone was sent for microbiology for bone culture the edges were rasped smooth.  The wound was then irrigated and closed with Monocryl and nylon  Left Foot Excision of Ulcer, Adjacent Tissue Transfer  Attention then directed the plantar aspect of the left fifth metatarsal area with areas of wound measuring 2 x 2.  The wound was sharply excised in toto with a 15 blade.  The wound was then irrigated.  A local lobed rotation flap was drawn and raised from the underlying skin subtenons tissue with care to avoid damage to the pedicle.  This was rotated into position and adhered with Monocryl and skin staples.  Right Fifth Metatarsal Head Excision Attention was then directed to the right fifth metatarsal head area.  A linear incision was made on lateral aspect of the fifth metatarsophalangeal joint.  Dissection carried out the skin and subcu tissue with care to avoid vital neurovascular structures.  Dissection was carried down to level of the metatarsophalangeal joint capsule linear incision is made in the capsule.  A sagittal saw was used to cut the fifth metatarsal head at the anatomical neck and this was sharply excised with a 15 blade.  The area was then copiously irrigated and closed in layers with Monocryl and skin staples  Right 5th Metatarsal Base Exostectomy Attention then directed to the right fifth metatarsal  base area.  There was a noted 1.5 x 1.5 cm ulceration at this area.  A swab culture was taken of this wound.  A linear incision was made at the lateral aspect of the fifth tarsometatarsal joint and dissection was carried down through the joint capsule.  The capsule was then  linearly incised gently freed from the bone a power rasp was used to rasp the prominent exostosis about the lateral and plantar aspects of the fifth metatarsal.  The bony prominence was felt to be reduced.  Right Intermediate Wound Repair Attention was then directed to the wound at the plantar aspect of the 5th metatarsal base, again measuring 1.5x1.5. Tthe wound edges were sharply excised with a 15 blade.  The skin was undermined from the underlying subcu tissue and the wound was closed in layers with Monocryl and nylon.  The foot was then dressed with Xeroform 4 x 4 Kerlix and Ace bandage. Patient tolerated the procedure well.   Disposition: Following a period of post-operative monitoring, patient will be transferred home.

## 2019-02-06 ENCOUNTER — Ambulatory Visit (INDEPENDENT_AMBULATORY_CARE_PROVIDER_SITE_OTHER): Payer: Medicare Other | Admitting: Internal Medicine

## 2019-02-06 ENCOUNTER — Encounter: Payer: Self-pay | Admitting: Internal Medicine

## 2019-02-06 DIAGNOSIS — E11621 Type 2 diabetes mellitus with foot ulcer: Secondary | ICD-10-CM

## 2019-02-06 DIAGNOSIS — M5440 Lumbago with sciatica, unspecified side: Secondary | ICD-10-CM

## 2019-02-06 DIAGNOSIS — L97404 Non-pressure chronic ulcer of unspecified heel and midfoot with necrosis of bone: Secondary | ICD-10-CM | POA: Diagnosis not present

## 2019-02-06 DIAGNOSIS — E1140 Type 2 diabetes mellitus with diabetic neuropathy, unspecified: Secondary | ICD-10-CM

## 2019-02-06 MED ORDER — OXYCODONE HCL 15 MG PO TABS: 15.0000 mg | ORAL_TABLET | Freq: Four times a day (QID) | ORAL | 0 refills | Status: DC | PRN

## 2019-02-06 MED ORDER — OXYCODONE HCL 5 MG PO TABS: 5.0000 mg | ORAL_TABLET | Freq: Four times a day (QID) | ORAL | 0 refills | Status: DC | PRN

## 2019-02-06 NOTE — Assessment & Plan Note (Signed)
70/30 insulin

## 2019-02-06 NOTE — Progress Notes (Signed)
Virtual Visit via Telephone Note  I connected with DAANA PETRASEK on 02/06/19 at  3:00 PM EDT by telephone and verified that I am speaking with the correct person using two identifiers.   I discussed the limitations, risks, security and privacy concerns of performing an evaluation and management service by telephone and the availability of in person appointments. I also discussed with the patient that there may be a patient responsible charge related to this service. The patient expressed understanding and agreed to proceed.   History of Present Illness:   F/u LBP, DM, HTN  Observations/Objective:  NAD Assessment and Plan:  See Plan Follow Up Instructions:    I discussed the assessment and treatment plan with the patient. The patient was provided an opportunity to ask questions and all were answered. The patient agreed with the plan and demonstrated an understanding of the instructions.   The patient was advised to call back or seek an in-person evaluation if the symptoms worsen or if the condition fails to improve as anticipated.  I provided 20 minutes of non-face-to-face time during this encounter.   Walker Kehr, MD

## 2019-02-06 NOTE — Assessment & Plan Note (Signed)
Healing

## 2019-02-06 NOTE — Assessment & Plan Note (Signed)
On Oxycodone 15 mg  Potential benefits of a long term opioids use as well as potential risks (i.e. addiction risk, apnea etc) and complications (i.e. Somnolence, constipation and others) were explained to the patient and were aknowledged. Risks of use w/benzodiazepines discussed  Do not take w/Clonazepam  

## 2019-02-07 ENCOUNTER — Other Ambulatory Visit: Payer: Medicare Other

## 2019-02-07 ENCOUNTER — Encounter: Payer: Medicare Other | Admitting: Genetic Counselor

## 2019-02-07 ENCOUNTER — Telehealth: Payer: Medicare Other | Admitting: Genetic Counselor

## 2019-02-12 NOTE — Progress Notes (Signed)
Subjective:  Patient ID: Katherine Herrera, female    DOB: 1966-04-22,  MRN: [id number]  Chief Complaint  Patient presents with  . Routine Post Op    DOS 01/02/2019 Metatarsal Head Res. 5th Rt, Poss. Wound Excision & Closure including Adjacent Skin Transfer Lt; "DOING ALRIGHT WITH LITTLE PAIN; BEEN TAKING PAIN MEDS THATS HELPING"    Date of Service: 01/02/2019 Procedures:             1) Left 5th Metatarsal Resection             2) Excision of Ulcer             3) Adjacent tissue Transfer left foot             4) Right 5th Metatarsal Head Resection             5) Right 5th Metatarsal Base Exostectomy             6) Intermediate Wound Repair Right  53 y.o. female returns for post-op check. Hx as above.  Review of Systems: Negative except as noted in the HPI. Denies N/V/F/Ch.  Past Medical History:  Diagnosis Date  . Anxiety   . COPD (chronic obstructive pulmonary disease) ([place])   . DM (diabetes mellitus) ([place])    type 2  . Family history of adverse reaction to anesthesia    mother hard to wake up  . Gout    possible  . Hyperlipidemia   . Hypothyroidism   . LBP (low back pain)    DR Patrice Paradise  . Migraine   . Peripheral neuropathy   . Syncope    poss conversion disorder vs psychogenic seizures  . Tachycardia    diet coke overuse?  Marland Kitchen Tenosynovitis of foot and ankle 09/03/2013  . TTS (tarsal tunnel syndrome) 2010   Left Foot [facility] in [place]  . Vitamin D deficiency   . Wound, open    both feet outer parts no drainage, changes dressing 1 day, wounds dime size for last month    Current Outpatient Medications:  .  aspirin (BAYER ASPIRIN) 325 MG tablet, Take 1 tablet (325 mg total) by mouth daily., Disp: 100 tablet, Rfl: 3 .  B-D INS SYR ULTRAFINE 1CC/31G 31G X 5/16" 1 ML MISC, USE AS DIRECTED, Disp: 100 each, Rfl: 0 .  baclofen (LIORESAL) 10 MG tablet, Take 2 tablets (20 mg total) by mouth 2 (two) times daily., Disp: 180 tablet, Rfl: 1 .  Blood Glucose Monitoring Suppl (ONETOUCH  VERIO) w/Device KIT, , Disp: , Rfl:  .  cholecalciferol (VITAMIN D) 25 MCG (1000 UT) tablet, Take 2 tablets (2,000 Units total) by mouth daily., Disp: 100 tablet, Rfl: 3 .  clindamycin (CLEOCIN) 300 MG capsule, Take 1 capsule (300 mg total) by mouth 2 (two) times daily., Disp: 14 capsule, Rfl: 0 .  clonazePAM (KLONOPIN) 0.5 MG tablet, Take 1 tablet (0.5 mg total) by mouth 2 (two) times daily as needed for anxiety., Disp: , Rfl:  .  docusate sodium (COLACE) 100 MG capsule, Take 200 mg by mouth every evening., Disp: , Rfl:  .  ezetimibe (ZETIA) 10 MG tablet, Take 1 tablet (10 mg total) by mouth daily. (Patient taking differently: Take 10 mg by mouth every evening. ), Disp: 90 tablet, Rfl: 1 .  gabapentin (NEURONTIN) 800 MG tablet, Take 1 tablet (800 mg total) by mouth 4 (four) times daily., Disp: , Rfl:  .  glucose blood (ONETOUCH VERIO) test strip, Use to check  blood sugar 2 times per day., Disp: 100 each, Rfl: 12 .  insulin NPH-regular Human (70-30) 100 UNIT/ML injection, Inject 70 Units into the skin 2 (two) times daily with a meal., Disp: , Rfl:  .  Insulin Syringe-Needle U-100 (SURE COMFORT INSULIN SYRINGE) 31G X 5/16" 0.5 ML MISC, USE AS DIRECTED WITH INSULIN 4 TIMES A DAY, Disp: 400 each, Rfl: 2 .  lamoTRIgine (LAMICTAL) 25 MG tablet, TAKE 1 TABLET BY MOUTH TWO  TIMES DAILY, Disp: 180 tablet, Rfl: 1 .  Lancet Devices (ACCU-CHEK SOFTCLIX) lancets, Test two times daily, Disp: 100 each, Rfl: 12 .  levothyroxine (SYNTHROID, LEVOTHROID) 75 MCG tablet, TAKE 1 TABLET BY MOUTH  DAILY, Disp: 90 tablet, Rfl: 3 .  lisinopril (PRINIVIL,ZESTRIL) 10 MG tablet, Take 1 tablet (10 mg total) by mouth daily., Disp: 90 tablet, Rfl: 1 .  loratadine (CLARITIN) 10 MG tablet, Take 10 mg by mouth daily as needed for allergies., Disp: , Rfl:  .  ONETOUCH DELICA LANCETS 48G MISC, TEST TWO TIMES DAILY, Disp: 200 each, Rfl: 2 .  rOPINIRole (REQUIP) 0.5 MG tablet, Take 1 tablet (0.5 mg total) by mouth at bedtime., Disp: 90  tablet, Rfl: 1 .  vitamin B-12 (CYANOCOBALAMIN) 1000 MCG tablet, Take 1,000 mcg by mouth daily.  , Disp: , Rfl:  .  oxyCODONE (ROXICODONE) 15 MG immediate release tablet, Take 1 tablet (15 mg total) by mouth every 6 (six) hours as needed for pain., Disp: 120 tablet, Rfl: 0 .  oxyCODONE (ROXICODONE) 5 MG immediate release tablet, Take 1 tablet (5 mg total) by mouth every 6 (six) hours as needed for severe pain., Disp: 120 tablet, Rfl: 0  Social History   Tobacco Use  Smoking Status Current Every Day Smoker  . Packs/day: 1.00  Smokeless Tobacco Never Used    Allergies  Allergen Reactions  . Hydromorphone Hcl Itching  . Cephalexin Nausea And Vomiting  . Demerol  [Meperidine Hcl] Rash  . Lovastatin Other (See Comments)    Other reaction(s): Unknown (See Comments) Possible myalgia Unknown   . Metformin And Related Nausea And Vomiting  . Sulfamethoxazole-Trimethoprim     Other reaction(s): Unknown DILI, pancreatitis  . Crestor [Rosuvastatin Calcium]     myalgia  . Niacin Other (See Comments)    Unknown   . Paroxetine Other (See Comments)    Other reaction(s): Unknown (See Comments) unknown Unknown    Objective:   Vitals:   01/10/19 1128  BP: 120/69  Pulse: 72  Temp: (!) 97.4 F (36.3 C)   There is no height or weight on file to calculate BMI. Constitutional Well developed. Well nourished.  Vascular Foot warm and well perfused. Capillary refill normal to all digits.   Neurologic Normal speech. Oriented to person, place, and time. Epicritic sensation to light touch grossly present bilaterally.  Dermatologic Skin healing well without signs of infection. Skin edges well coapted without signs of infection.  Orthopedic: Tenderness to palpation noted about the surgical site.   Radiographs: None today. Assessment:   1. Post-operative state    Plan:  Patient was evaluated and treated and all questions answered.  S/p foot surgery bilaterally -Progressing as expected  post-operatively. -XR: None -WB Status: WBAT in sx shoes -Sutures: intact. -Medications: pain med refilled. -Foot redressed.  Return in about 1 week (around 01/17/2019).

## 2019-02-15 ENCOUNTER — Ambulatory Visit: Payer: Self-pay

## 2019-02-15 NOTE — Telephone Encounter (Signed)
Patient called and says for the past couple weeks, she's been having off and on lower abdominal pain that goes to her lower back and more on the right side. She says the pain is intense and lasts about 30 minutes. She says she takes a pain pill for her back and it will make her sleep and the pain goes away. She says she's not hurting now, but would like a visit to discuss what is causing it. She says she's not having any urine problems, no burning, no discoloration to her urine, no pain with urination. She denies other symptoms. I asked about virtual visit, she says she can on Monday. I called the office and spoke to [place], [facility] who asks to speak to the patient, the call was connected successfully.   Answer Assessment - Initial Assessment Questions 1. LOCATION: "Where does it hurt?"      Lower abdomen 2. RADIATION: "Does the pain shoot anywhere else?" (e.g., chest, back)     Back more on the right side 3. ONSET: "When did the pain begin?" (e.g., minutes, hours or days ago)      Gotten worse over the last 2 weeks 4. SUDDEN: "Gradual or sudden onset?"     Sudden 5. PATTERN "Does the pain come and go, or is it constant?"    - If constant: "Is it getting better, staying the same, or worsening?"      (Note: Constant means the pain never goes away completely; most serious pain is constant and it progresses)     - If intermittent: "How long does it last?" "Do you have pain now?"     (Note: Intermittent means the pain goes away completely between bouts)     Come and go; no pain now; lasts 3-4 hours when have the pain 6. SEVERITY: "How bad is the pain?"  (e.g., Scale 1-10; mild, moderate, or severe)   - MILD (1-3): doesn't interfere with normal activities, abdomen soft and not tender to touch    - MODERATE (4-7): interferes with normal activities or awakens from sleep, tender to touch    - SEVERE (8-10): excruciating pain, doubled over, unable to do any normal activities      No pain now 7. RECURRENT  SYMPTOM: "Have you ever had this type of abdominal pain before?" If so, ask: "When was the last time?" and "What happened that time?"      Not the lower abdomen 8. CAUSE: "What do you think is causing the abdominal pain?"     I don't know 9. RELIEVING/AGGRAVATING FACTORS: "What makes it better or worse?" (e.g., movement, antacids, bowel movement)     Taking pain medication that normally takes for chronic back pain took the pain away 10. OTHER SYMPTOMS: "Has there been any vomiting, diarrhea, constipation, or urine problems?"       A little diarrhea in last 2 weeks 11. PREGNANCY: "Is there any chance you are pregnant?" "When was your last menstrual period?"       No  Protocols used: ABDOMINAL PAIN - [facility]

## 2019-02-15 NOTE — Telephone Encounter (Signed)
Tammy/sched is calling patient to make sure she can not do virtual today, if not, will try to get scheduled with plot[place] for Monday 02/18/19

## 2019-02-18 ENCOUNTER — Ambulatory Visit (INDEPENDENT_AMBULATORY_CARE_PROVIDER_SITE_OTHER): Payer: Medicare Other | Admitting: Internal Medicine

## 2019-02-18 ENCOUNTER — Encounter: Payer: Self-pay | Admitting: Internal Medicine

## 2019-02-18 DIAGNOSIS — R1031 Right lower quadrant pain: Secondary | ICD-10-CM | POA: Diagnosis not present

## 2019-02-18 NOTE — Assessment & Plan Note (Signed)
Katherine Herrera is complaining of several episodes of her stomach being upset over past year.  It was pain.  Over past 2 weeks she had 3 episodes of severe pain in the lower abdomen/groin area more on the right side.  Each episode lasted for 2-3 hours.  The pain was worse than childbirth.  Eventually she took her pain medicine and the pain subsided.  There was no nausea/vomiting/bulging of the abdomen/diarrhea.  She thinks she had appendectomy when she was 53 years of age.  No previous history of kidney stones. The etiology is unclear.  Will obtain urinalysis and other labs.  CT of the abdomen to rule out nephrolithiasis.  Take Oxy PRN.  Go to ER if worse

## 2019-02-18 NOTE — Progress Notes (Signed)
Virtual Visit via Telephone Note  I connected with Katherine Herrera on 02/18/19 at  4:00 PM EDT by telephone and verified that I am speaking with the correct person using two identifiers.   I discussed the limitations, risks, security and privacy concerns of performing an evaluation and management service by telephone and the availability of in person appointments. I also discussed with the patient that there may be a patient responsible charge related to this service. The patient expressed understanding and agreed to proceed.   History of Present Illness:   Katherine Herrera is complaining of several episodes of her stomach being upset over past year.  It was pain.  Over past 2 weeks she had 3 episodes of severe pain in the lower abdomen/groin area more on the right side.  Each episode lasted for 2-3 hours.  The pain was worse than childbirth.  Eventually she took her pain medicine and the pain subsided.  There was no nausea/vomiting/bulging of the abdomen/diarrhea.  She thinks she had appendectomy when she was 53 years of age.  No previous history of kidney stones. Observations/Objective: Katherine Herrera is in no acute distress.  She appears well  Assessment and Plan:  See plan Follow Up Instructions:    I discussed the assessment and treatment plan with the patient. The patient was provided an opportunity to ask questions and all were answered. The patient agreed with the plan and demonstrated an understanding of the instructions.   The patient was advised to call back or seek an in-person evaluation if the symptoms worsen or if the condition fails to improve as anticipated.  I provided 20 minutes of non-face-to-face time during this encounter.   Walker Kehr, MD

## 2019-02-19 ENCOUNTER — Telehealth: Payer: Self-pay | Admitting: *Deleted

## 2019-02-19 NOTE — Telephone Encounter (Signed)
Covid-19 travel screening questions  Have you traveled in the last 14 days? If yes where? No Do you now or have you had a fever in the last 14 days? No Do you have any respiratory symptoms of shortness of breath or cough now or in the last 14 days? No Do you have a medical history of Congestive Heart Failure? No Do you have a medical history of lung disease? No Do you have any family members or close contacts with diagnosed or suspected Covid-19? No      

## 2019-02-20 ENCOUNTER — Other Ambulatory Visit (INDEPENDENT_AMBULATORY_CARE_PROVIDER_SITE_OTHER): Payer: Medicare Other

## 2019-02-20 ENCOUNTER — Ambulatory Visit (INDEPENDENT_AMBULATORY_CARE_PROVIDER_SITE_OTHER)
Admission: RE | Admit: 2019-02-20 | Discharge: 2019-02-20 | Disposition: A | Discharge status: Home / Self Care | Payer: Medicare Other | Source: Ambulatory Visit | Attending: Internal Medicine | Admitting: Internal Medicine

## 2019-02-20 ENCOUNTER — Other Ambulatory Visit: Payer: Self-pay

## 2019-02-20 DIAGNOSIS — R1031 Right lower quadrant pain: Secondary | ICD-10-CM

## 2019-02-20 LAB — URINALYSIS
Bilirubin Urine: NEGATIVE
Hgb urine dipstick: NEGATIVE
Ketones, ur: NEGATIVE
Leukocytes,Ua: NEGATIVE
Nitrite: NEGATIVE
Specific Gravity, Urine: 1.01 (ref 1.000–1.030)
Total Protein, Urine: NEGATIVE
Urine Glucose: NEGATIVE
Urobilinogen, UA: 0.2 (ref 0.0–1.0)
pH: 5.5 (ref 5.0–8.0)

## 2019-02-20 LAB — BASIC METABOLIC PANEL
BUN: 20 mg/dL (ref 6–23)
CO2: 30 mEq/L (ref 19–32)
Calcium: 8.8 mg/dL (ref 8.4–10.5)
Chloride: 100 mEq/L (ref 96–112)
Creatinine, Ser: 0.96 mg/dL (ref 0.40–1.20)
GFR: 60.74 mL/min (ref >60.00)
Glucose, Bld: 208 mg/dL — ABNORMAL HIGH (ref 70–99)
Potassium: 4.7 mEq/L (ref 3.5–5.1)
Sodium: 135 mEq/L (ref 135–145)

## 2019-02-20 LAB — CBC WITH DIFFERENTIAL/PLATELET
Basophils Absolute: 0.1 10*3/uL (ref 0.0–0.1)
Basophils Relative: 1 % (ref 0.0–3.0)
Eosinophils Absolute: 0.3 10*3/uL (ref 0.0–0.7)
Eosinophils Relative: 2.5 % (ref 0.0–5.0)
HCT: 42.3 % (ref 36.0–46.0)
Hemoglobin: 14.2 g/dL (ref 12.0–15.0)
Lymphocytes Relative: 29.3 % (ref 12.0–46.0)
Lymphs Abs: 3 10*3/uL (ref 0.7–4.0)
MCHC: 33.5 g/dL (ref 30.0–36.0)
MCV: 85.2 fl (ref 78.0–100.0)
Monocytes Absolute: 0.6 10*3/uL (ref 0.1–1.0)
Monocytes Relative: 5.8 % (ref 3.0–12.0)
Neutro Abs: 6.4 10*3/uL (ref 1.4–7.7)
Neutrophils Relative %: 61.4 % (ref 43.0–77.0)
Platelets: 271 10*3/uL (ref 150.0–400.0)
RBC: 4.97 Mil/uL (ref 3.87–5.11)
RDW: 14.3 % (ref 11.5–15.5)
WBC: 10.4 10*3/uL (ref 4.0–10.5)

## 2019-02-21 ENCOUNTER — Encounter: Payer: Self-pay | Admitting: Podiatry

## 2019-02-21 ENCOUNTER — Ambulatory Visit (INDEPENDENT_AMBULATORY_CARE_PROVIDER_SITE_OTHER): Payer: Medicare Other | Admitting: Podiatry

## 2019-02-21 VITALS — Temp 97.2°F

## 2019-02-21 DIAGNOSIS — E7800 Pure hypercholesterolemia, unspecified: Secondary | ICD-10-CM | POA: Diagnosis not present

## 2019-02-21 DIAGNOSIS — I10 Essential (primary) hypertension: Secondary | ICD-10-CM | POA: Diagnosis not present

## 2019-02-21 DIAGNOSIS — Z9889 Other specified postprocedural states: Secondary | ICD-10-CM

## 2019-02-21 DIAGNOSIS — E1165 Type 2 diabetes mellitus with hyperglycemia: Secondary | ICD-10-CM | POA: Diagnosis not present

## 2019-02-21 DIAGNOSIS — E039 Hypothyroidism, unspecified: Secondary | ICD-10-CM | POA: Diagnosis not present

## 2019-02-21 DIAGNOSIS — N189 Chronic kidney disease, unspecified: Secondary | ICD-10-CM | POA: Diagnosis not present

## 2019-02-21 DIAGNOSIS — E1140 Type 2 diabetes mellitus with diabetic neuropathy, unspecified: Secondary | ICD-10-CM | POA: Diagnosis not present

## 2019-02-21 NOTE — Progress Notes (Signed)
Subjective:  Patient ID: Katherine Herrera, female    DOB: [birth year],  MRN: [id number]  Chief Complaint  Patient presents with   Routine Post Op    RT foot: " my foot hurts quite a bit on the top and outside, it feels like nerve pain"    53 y.o. female returns for post-op check. History as above. Otherwise states it does not hurt to walk on her feet.  Review of Systems: Negative except as noted in the HPI. Denies N/V/F/Ch.  Past Medical History:  Diagnosis Date   Anxiety    COPD (chronic obstructive pulmonary disease) ([place])    DM (diabetes mellitus) ([place])    type 2   Family history of adverse reaction to anesthesia    mother hard to wake up   Gout    possible   Hyperlipidemia    Hypothyroidism    LBP (low back pain)    DR Patrice Paradise   Migraine    Peripheral neuropathy    Syncope    poss conversion disorder vs psychogenic seizures   Tachycardia    diet coke overuse?   Tenosynovitis of foot and ankle 09/03/2013   TTS (tarsal tunnel syndrome) 2010   Left Foot [facility] in [place]   Vitamin D deficiency    Wound, open    both feet outer parts no drainage, changes dressing 1 day, wounds dime size for last month    Current Outpatient Medications:    aspirin (BAYER ASPIRIN) 325 MG tablet, Take 1 tablet (325 mg total) by mouth daily., Disp: 100 tablet, Rfl: 3   B-D INS SYR ULTRAFINE 1CC/31G 31G X 5/16" 1 ML MISC, USE AS DIRECTED, Disp: 100 each, Rfl: 0   baclofen (LIORESAL) 10 MG tablet, Take 2 tablets (20 mg total) by mouth 2 (two) times daily., Disp: 180 tablet, Rfl: 1   Blood Glucose Monitoring Suppl (ONETOUCH VERIO) w/Device KIT, , Disp: , Rfl:    cholecalciferol (VITAMIN D) 25 MCG (1000 UT) tablet, Take 2 tablets (2,000 Units total) by mouth daily., Disp: 100 tablet, Rfl: 3   clindamycin (CLEOCIN) 300 MG capsule, Take 1 capsule (300 mg total) by mouth 2 (two) times daily., Disp: 14 capsule, Rfl: 0   clonazePAM (KLONOPIN) 0.5 MG tablet, Take 1 tablet (0.5 mg  total) by mouth 2 (two) times daily as needed for anxiety., Disp: , Rfl:    docusate sodium (COLACE) 100 MG capsule, Take 200 mg by mouth every evening., Disp: , Rfl:    ezetimibe (ZETIA) 10 MG tablet, Take 1 tablet (10 mg total) by mouth daily. (Patient taking differently: Take 10 mg by mouth every evening. ), Disp: 90 tablet, Rfl: 1   gabapentin (NEURONTIN) 800 MG tablet, Take 1 tablet (800 mg total) by mouth 4 (four) times daily., Disp: , Rfl:    glucose blood (ONETOUCH VERIO) test strip, Use to check blood sugar 2 times per day., Disp: 100 each, Rfl: 12   insulin NPH-regular Human (70-30) 100 UNIT/ML injection, Inject 70 Units into the skin 2 (two) times daily with a meal., Disp: , Rfl:    Insulin Syringe-Needle U-100 (SURE COMFORT INSULIN SYRINGE) 31G X 5/16" 0.5 ML MISC, USE AS DIRECTED WITH INSULIN 4 TIMES A DAY, Disp: 400 each, Rfl: 2   lamoTRIgine (LAMICTAL) 25 MG tablet, TAKE 1 TABLET BY MOUTH TWO  TIMES DAILY, Disp: 180 tablet, Rfl: 1   Lancet Devices (ACCU-CHEK SOFTCLIX) lancets, Test two times daily, Disp: 100 each, Rfl: 12   levothyroxine (SYNTHROID, LEVOTHROID)  75 MCG tablet, TAKE 1 TABLET BY MOUTH  DAILY, Disp: 90 tablet, Rfl: 3   lisinopril (PRINIVIL,ZESTRIL) 10 MG tablet, Take 1 tablet (10 mg total) by mouth daily., Disp: 90 tablet, Rfl: 1   loratadine (CLARITIN) 10 MG tablet, Take 10 mg by mouth daily as needed for allergies., Disp: , Rfl:    ONETOUCH DELICA LANCETS 80I MISC, TEST TWO TIMES DAILY, Disp: 200 each, Rfl: 2   oxyCODONE (ROXICODONE) 15 MG immediate release tablet, Take 1 tablet (15 mg total) by mouth every 6 (six) hours as needed for pain., Disp: 120 tablet, Rfl: 0   oxyCODONE (ROXICODONE) 5 MG immediate release tablet, Take 1 tablet (5 mg total) by mouth every 6 (six) hours as needed for severe pain., Disp: 120 tablet, Rfl: 0   rOPINIRole (REQUIP) 0.5 MG tablet, Take 1 tablet (0.5 mg total) by mouth at bedtime., Disp: 90 tablet, Rfl: 1   vitamin B-12  (CYANOCOBALAMIN) 1000 MCG tablet, Take 1,000 mcg by mouth daily.  , Disp: , Rfl:   Social History   Tobacco Use  Smoking Status Current Every Day Smoker   Packs/day: 1.00  Smokeless Tobacco Never Used    Allergies  Allergen Reactions   Hydromorphone Hcl Itching   Cephalexin Nausea And Vomiting   Demerol  [Meperidine Hcl] Rash   Lovastatin Other (See Comments)    Other reaction(s): Unknown (See Comments) Possible myalgia Unknown    Metformin And Related Nausea And Vomiting   Sulfamethoxazole-Trimethoprim     Other reaction(s): Unknown DILI, pancreatitis   Crestor [Rosuvastatin Calcium]     myalgia   Niacin Other (See Comments)    Unknown    Paroxetine Other (See Comments)    Other reaction(s): Unknown (See Comments) unknown Unknown    Objective:   Vitals:   02/21/19 1348  Temp: (!) 97.2 F (36.2 C)   There is no height or weight on file to calculate BMI. Constitutional Well developed. Well nourished.  Vascular Foot warm and well perfused. Capillary refill normal to all digits.   Neurologic Normal speech. Oriented to person, place, and time. Epicritic sensation to light touch grossly present bilaterally.  Dermatologic 5th MPJ sites well healed bilateral foot. 5th met base right with small area of open ulceration with granular base. No warmth, erythema, signs of acute infection noted.  Orthopedic: Tenderness to palpation noted about the surgical site.   Radiographs: None Assessment:   1. Post-operative state    Plan:  Patient was evaluated and treated and all questions answered.  S/p foot surgery bilaterally -Progressing as expected post-operatively. -XR: none -WB Status: WBAT in surgical shoes -Sutures: out -Medications: none refilled. -Foot redressed. -Dress right foot daily with neosporin.  Return in about 2 weeks (around 03/07/2019) for Post-op.

## 2019-03-07 ENCOUNTER — Ambulatory Visit (INDEPENDENT_AMBULATORY_CARE_PROVIDER_SITE_OTHER): Payer: Medicare Other | Admitting: Podiatry

## 2019-03-07 ENCOUNTER — Telehealth: Payer: Self-pay | Admitting: Internal Medicine

## 2019-03-07 ENCOUNTER — Other Ambulatory Visit: Payer: Self-pay

## 2019-03-07 VITALS — Temp 97.7°F

## 2019-03-07 DIAGNOSIS — B351 Tinea unguium: Secondary | ICD-10-CM

## 2019-03-07 DIAGNOSIS — E1169 Type 2 diabetes mellitus with other specified complication: Secondary | ICD-10-CM | POA: Diagnosis not present

## 2019-03-07 DIAGNOSIS — E1151 Type 2 diabetes mellitus with diabetic peripheral angiopathy without gangrene: Secondary | ICD-10-CM

## 2019-03-07 DIAGNOSIS — Z9889 Other specified postprocedural states: Secondary | ICD-10-CM

## 2019-03-07 NOTE — Telephone Encounter (Signed)
Copied from [place] [id number]. Topic: General - Other >> Mar 07, 2019 10:42 AM Katherine Herrera, Katherine Herrera wrote: Reason for CRM: Patient calling and states that Sharyn Lull, patient's Secondary school teacher, should be in contact with office in the next few days regarding the discussion of the patient being eligible for a support animal. Patient states that she has a history of depression and anxiety and this is why she is needing the support animal.   CB#: [phone]

## 2019-03-13 NOTE — Telephone Encounter (Signed)
Received form and it was given to PCP

## 2019-03-19 ENCOUNTER — Ambulatory Visit: Payer: Medicare Other | Admitting: Orthotics

## 2019-03-19 ENCOUNTER — Other Ambulatory Visit: Payer: Self-pay

## 2019-03-19 DIAGNOSIS — Z9889 Other specified postprocedural states: Secondary | ICD-10-CM

## 2019-03-19 DIAGNOSIS — E11621 Type 2 diabetes mellitus with foot ulcer: Secondary | ICD-10-CM

## 2019-03-19 DIAGNOSIS — E1142 Type 2 diabetes mellitus with diabetic polyneuropathy: Secondary | ICD-10-CM

## 2019-03-19 NOTE — Telephone Encounter (Signed)
Pt would like a refill on tramadol, last prescribed in 2016, please advise

## 2019-03-22 ENCOUNTER — Other Ambulatory Visit: Payer: Self-pay | Admitting: Internal Medicine

## 2019-03-22 NOTE — Telephone Encounter (Signed)
Copied from [place] [phone]. Topic: Quick Communication - Rx Refill/Question >> Mar 22, 2019  4:35 PM Keene Breath wrote: Medication: rOPINIRole (REQUIP) 0.5 MG tablet / baclofen (LIORESAL) 10 MG tablet / ezetimibe (ZETIA) 10 MG tablet / gabapentin (NEURONTIN) 800 MG tablet / lisinopril (PRINIVIL,ZESTRIL) 10 MG tablet  Patient called to request a refill for the above medications  Preferred Pharmacy (with phone number or street name): [place], [place] [employer] [phone] (Phone) [phone] (Fax)

## 2019-03-22 NOTE — Telephone Encounter (Signed)
Please advise about gabapentin, last prescribed by Dr. Loanne Drilling

## 2019-03-26 NOTE — Progress Notes (Signed)

## 2019-03-28 MED ORDER — BACLOFEN 10 MG PO TABS: 20.0000 mg | ORAL_TABLET | Freq: Two times a day (BID) | ORAL | 1 refills | Status: DC

## 2019-03-28 MED ORDER — ROPINIROLE HCL 0.5 MG PO TABS: 0.5000 mg | ORAL_TABLET | Freq: Every day | ORAL | 1 refills | Status: DC

## 2019-03-28 MED ORDER — LISINOPRIL 10 MG PO TABS: 10.0000 mg | ORAL_TABLET | Freq: Every day | ORAL | 3 refills | Status: DC

## 2019-03-28 MED ORDER — EZETIMIBE 10 MG PO TABS: 10.0000 mg | ORAL_TABLET | Freq: Every day | ORAL | 3 refills | Status: DC

## 2019-03-28 MED ORDER — GABAPENTIN 800 MG PO TABS: 800.0000 mg | ORAL_TABLET | Freq: Four times a day (QID) | ORAL | 1 refills | Status: DC

## 2019-03-31 NOTE — Progress Notes (Signed)
Subjective:  Patient ID: Katherine Herrera, female    DOB: 03/26/1966,  MRN: [id number]  Chief Complaint  Patient presents with  . Wound Check    Pt states right foot lateral wound is painful and possibly worse than last visit. Pt denies fever/nausea/vomiting/chills. Pt states left foot is healing and feeling okay.  . Nail Problem    Nail trim 1-5 bilateral   53 y.o. female returns for post-op check. History as above.   Review of Systems: Negative except as noted in the HPI. Denies N/V/F/Ch.  Past Medical History:  Diagnosis Date  . Anxiety   . COPD (chronic obstructive pulmonary disease) ([place])   . DM (diabetes mellitus) ([place])    type 2  . Family history of adverse reaction to anesthesia    mother hard to wake up  . Gout    possible  . Hyperlipidemia   . Hypothyroidism   . LBP (low back pain)    DR Patrice Paradise  . Migraine   . Peripheral neuropathy   . Syncope    poss conversion disorder vs psychogenic seizures  . Tachycardia    diet coke overuse?  Marland Kitchen Tenosynovitis of foot and ankle 09/03/2013  . TTS (tarsal tunnel syndrome) 2010   Left Foot [facility] in [place]  . Vitamin D deficiency   . Wound, open    both feet outer parts no drainage, changes dressing 1 day, wounds dime size for last month    Current Outpatient Medications:  .  aspirin (BAYER ASPIRIN) 325 MG tablet, Take 1 tablet (325 mg total) by mouth daily., Disp: 100 tablet, Rfl: 3 .  B-D INS SYR ULTRAFINE 1CC/31G 31G X 5/16" 1 ML MISC, USE AS DIRECTED, Disp: 100 each, Rfl: 0 .  Blood Glucose Monitoring Suppl (ONETOUCH VERIO) w/Device KIT, , Disp: , Rfl:  .  cholecalciferol (VITAMIN D) 25 MCG (1000 UT) tablet, Take 2 tablets (2,000 Units total) by mouth daily., Disp: 100 tablet, Rfl: 3 .  clindamycin (CLEOCIN) 300 MG capsule, Take 1 capsule (300 mg total) by mouth 2 (two) times daily., Disp: 14 capsule, Rfl: 0 .  clonazePAM (KLONOPIN) 0.5 MG tablet, Take 1 tablet (0.5 mg total) by mouth 2 (two) times daily as needed for  anxiety., Disp: , Rfl:  .  docusate sodium (COLACE) 100 MG capsule, Take 200 mg by mouth every evening., Disp: , Rfl:  .  glucose blood (ONETOUCH VERIO) test strip, Use to check blood sugar 2 times per day., Disp: 100 each, Rfl: 12 .  insulin NPH-regular Human (70-30) 100 UNIT/ML injection, Inject 70 Units into the skin 2 (two) times daily with a meal., Disp: , Rfl:  .  Insulin Syringe-Needle U-100 (SURE COMFORT INSULIN SYRINGE) 31G X 5/16" 0.5 ML MISC, USE AS DIRECTED WITH INSULIN 4 TIMES A DAY, Disp: 400 each, Rfl: 2 .  lamoTRIgine (LAMICTAL) 25 MG tablet, TAKE 1 TABLET BY MOUTH TWO  TIMES DAILY, Disp: 180 tablet, Rfl: 1 .  Lancet Devices (ACCU-CHEK SOFTCLIX) lancets, Test two times daily, Disp: 100 each, Rfl: 12 .  levothyroxine (SYNTHROID, LEVOTHROID) 75 MCG tablet, TAKE 1 TABLET BY MOUTH  DAILY, Disp: 90 tablet, Rfl: 3 .  loratadine (CLARITIN) 10 MG tablet, Take 10 mg by mouth daily as needed for allergies., Disp: , Rfl:  .  ONETOUCH DELICA LANCETS 95K MISC, TEST TWO TIMES DAILY, Disp: 200 each, Rfl: 2 .  oxyCODONE (ROXICODONE) 15 MG immediate release tablet, Take 1 tablet (15 mg total) by mouth every 6 (six) hours  as needed for pain., Disp: 120 tablet, Rfl: 0 .  oxyCODONE (ROXICODONE) 5 MG immediate release tablet, Take 1 tablet (5 mg total) by mouth every 6 (six) hours as needed for severe pain., Disp: 120 tablet, Rfl: 0 .  vitamin B-12 (CYANOCOBALAMIN) 1000 MCG tablet, Take 1,000 mcg by mouth daily.  , Disp: , Rfl:  .  baclofen (LIORESAL) 10 MG tablet, Take 2 tablets (20 mg total) by mouth 2 (two) times daily., Disp: 180 tablet, Rfl: 1 .  ezetimibe (ZETIA) 10 MG tablet, Take 1 tablet (10 mg total) by mouth daily., Disp: 90 tablet, Rfl: 3 .  gabapentin (NEURONTIN) 800 MG tablet, Take 1 tablet (800 mg total) by mouth 4 (four) times daily., Disp: 360 tablet, Rfl: 1 .  lisinopril (ZESTRIL) 10 MG tablet, Take 1 tablet (10 mg total) by mouth daily., Disp: 90 tablet, Rfl: 3 .  rOPINIRole (REQUIP)  0.5 MG tablet, Take 1 tablet (0.5 mg total) by mouth at bedtime., Disp: 90 tablet, Rfl: 1  Social History   Tobacco Use  Smoking Status Current Every Day Smoker  . Packs/day: 1.00  Smokeless Tobacco Never Used    Allergies  Allergen Reactions  . Hydromorphone Hcl Itching  . Cephalexin Nausea And Vomiting  . Demerol  [Meperidine Hcl] Rash  . Lovastatin Other (See Comments)    Other reaction(s): Unknown (See Comments) Possible myalgia Unknown   . Metformin And Related Nausea And Vomiting  . Sulfamethoxazole-Trimethoprim     Other reaction(s): Unknown DILI, pancreatitis  . Crestor [Rosuvastatin Calcium]     myalgia  . Niacin Other (See Comments)    Unknown   . Paroxetine Other (See Comments)    Other reaction(s): Unknown (See Comments) unknown Unknown    Objective:   Vitals:   03/07/19 1418  Temp: 97.7 F (36.5 C)   There is no height or weight on file to calculate BMI. Constitutional Well developed. Well nourished.  Vascular Foot warm and well perfused. Capillary refill normal to all digits.   Neurologic Normal speech. Oriented to person, place, and time. Epicritic sensation to light touch grossly present bilaterally.  Dermatologic 5th MPJ sites well healed bilateral foot. 5th met base right still with significant hyperkeratosis and pain to palpation  Orthopedic: Tenderness to palpation noted about the surgical site right no pain left.   Radiographs: None Assessment:   1. Onychomycosis of multiple toenails with type 2 diabetes mellitus and peripheral angiopathy ([place])   2. Post-operative state    Plan:  Patient was evaluated and treated and all questions answered.  S/p foot surgery bilaterally -Progressing as expected post-operatively. -XR: none -WB Status: WBAT in surgical shoes.  Pending eval by orthotist -Discussed the patient that due to her foot type it was not completely possible to excise the bone about the fifth met base area on her right foot and  providing the same pain relief that we could on the left.  We attempted to reduce the bone on the right side.  Hopefully we can get a shoe option that would help on her right side.  DM with DPN, Onycho -Nails debrided x10  Return in about 4 weeks (around 04/04/2019).

## 2019-04-02 ENCOUNTER — Ambulatory Visit (INDEPENDENT_AMBULATORY_CARE_PROVIDER_SITE_OTHER): Payer: Medicare Other | Admitting: Podiatry

## 2019-04-02 ENCOUNTER — Encounter: Payer: Self-pay | Admitting: Podiatry

## 2019-04-02 ENCOUNTER — Ambulatory Visit (INDEPENDENT_AMBULATORY_CARE_PROVIDER_SITE_OTHER): Payer: Medicare Other

## 2019-04-02 ENCOUNTER — Other Ambulatory Visit: Payer: Self-pay

## 2019-04-02 VITALS — Temp 98.7°F | Resp 16

## 2019-04-02 DIAGNOSIS — L97511 Non-pressure chronic ulcer of other part of right foot limited to breakdown of skin: Secondary | ICD-10-CM | POA: Diagnosis not present

## 2019-04-02 DIAGNOSIS — Z9889 Other specified postprocedural states: Secondary | ICD-10-CM

## 2019-04-02 NOTE — Progress Notes (Signed)
Subjective:  Patient ID: Katherine Herrera, female    DOB: Aug 04, 1966,  MRN: [id number]  Chief Complaint  Patient presents with  . Routine Post Op    POV -pt states," this past weeks I think my foot started getting infected, it feel like walking in fluid, and its been very tender ( 5/10)." . -w/ redness and slight swelling -w/ bloody fluid Tx: sx shoe, and abx cream    53 y.o. female returns for post-op check. History as above.   Review of Systems: Negative except as noted in the HPI. Denies N/V/F/Ch.  Past Medical History:  Diagnosis Date  . Anxiety   . COPD (chronic obstructive pulmonary disease) ([place])   . DM (diabetes mellitus) ([place])    type 2  . Family history of adverse reaction to anesthesia    mother hard to wake up  . Gout    possible  . Hyperlipidemia   . Hypothyroidism   . LBP (low back pain)    DR Patrice Paradise  . Migraine   . Peripheral neuropathy   . Syncope    poss conversion disorder vs psychogenic seizures  . Tachycardia    diet coke overuse?  Marland Kitchen Tenosynovitis of foot and ankle 09/03/2013  . TTS (tarsal tunnel syndrome) 2010   Left Foot [place] in [place]  . Vitamin D deficiency   . Wound, open    both feet outer parts no drainage, changes dressing 1 day, wounds dime size for last month    Current Outpatient Medications:  .  aspirin (BAYER ASPIRIN) 325 MG tablet, Take 1 tablet (325 mg total) by mouth daily., Disp: 100 tablet, Rfl: 3 .  B-D INS SYR ULTRAFINE 1CC/31G 31G X 5/16" 1 ML MISC, USE AS DIRECTED, Disp: 100 each, Rfl: 0 .  baclofen (LIORESAL) 10 MG tablet, Take 2 tablets (20 mg total) by mouth 2 (two) times daily., Disp: 180 tablet, Rfl: 1 .  Blood Glucose Monitoring Suppl (ONETOUCH VERIO) w/Device KIT, , Disp: , Rfl:  .  cholecalciferol (VITAMIN D) 25 MCG (1000 UT) tablet, Take 2 tablets (2,000 Units total) by mouth daily., Disp: 100 tablet, Rfl: 3 .  clindamycin (CLEOCIN) 300 MG capsule, Take 1 capsule (300 mg total) by mouth 2 (two) times daily., Disp: 14  capsule, Rfl: 0 .  clonazePAM (KLONOPIN) 0.5 MG tablet, Take 1 tablet (0.5 mg total) by mouth 2 (two) times daily as needed for anxiety., Disp: , Rfl:  .  docusate sodium (COLACE) 100 MG capsule, Take 200 mg by mouth every evening., Disp: , Rfl:  .  ezetimibe (ZETIA) 10 MG tablet, Take 1 tablet (10 mg total) by mouth daily., Disp: 90 tablet, Rfl: 3 .  gabapentin (NEURONTIN) 800 MG tablet, Take 1 tablet (800 mg total) by mouth 4 (four) times daily., Disp: 360 tablet, Rfl: 1 .  glucose blood (ONETOUCH VERIO) test strip, Use to check blood sugar 2 times per day., Disp: 100 each, Rfl: 12 .  insulin NPH-regular Human (70-30) 100 UNIT/ML injection, Inject 70 Units into the skin 2 (two) times daily with a meal., Disp: , Rfl:  .  Insulin Syringe-Needle U-100 (SURE COMFORT INSULIN SYRINGE) 31G X 5/16" 0.5 ML MISC, USE AS DIRECTED WITH INSULIN 4 TIMES A DAY, Disp: 400 each, Rfl: 2 .  lamoTRIgine (LAMICTAL) 25 MG tablet, TAKE 1 TABLET BY MOUTH TWO  TIMES DAILY, Disp: 180 tablet, Rfl: 1 .  Lancet Devices (ACCU-CHEK SOFTCLIX) lancets, Test two times daily, Disp: 100 each, Rfl: 12 .  levothyroxine (  SYNTHROID, LEVOTHROID) 75 MCG tablet, TAKE 1 TABLET BY MOUTH  DAILY, Disp: 90 tablet, Rfl: 3 .  lisinopril (ZESTRIL) 10 MG tablet, Take 1 tablet (10 mg total) by mouth daily., Disp: 90 tablet, Rfl: 3 .  loratadine (CLARITIN) 10 MG tablet, Take 10 mg by mouth daily as needed for allergies., Disp: , Rfl:  .  ONETOUCH DELICA LANCETS 79T MISC, TEST TWO TIMES DAILY, Disp: 200 each, Rfl: 2 .  oxyCODONE (ROXICODONE) 15 MG immediate release tablet, Take 1 tablet (15 mg total) by mouth every 6 (six) hours as needed for pain., Disp: 120 tablet, Rfl: 0 .  oxyCODONE (ROXICODONE) 5 MG immediate release tablet, Take 1 tablet (5 mg total) by mouth every 6 (six) hours as needed for severe pain., Disp: 120 tablet, Rfl: 0 .  rOPINIRole (REQUIP) 0.5 MG tablet, Take 1 tablet (0.5 mg total) by mouth at bedtime., Disp: 90 tablet, Rfl: 1 .   vitamin B-12 (CYANOCOBALAMIN) 1000 MCG tablet, Take 1,000 mcg by mouth daily.  , Disp: , Rfl:   Social History   Tobacco Use  Smoking Status Current Every Day Smoker  . Packs/day: 1.00  Smokeless Tobacco Never Used    Allergies  Allergen Reactions  . Hydromorphone Hcl Itching  . Cephalexin Nausea And Vomiting  . Demerol  [Meperidine Hcl] Rash  . Lovastatin Other (See Comments)    Other reaction(s): Unknown (See Comments) Possible myalgia Unknown   . Metformin And Related Nausea And Vomiting  . Sulfamethoxazole-Trimethoprim     Other reaction(s): Unknown DILI, pancreatitis  . Crestor [Rosuvastatin Calcium]     myalgia  . Niacin Other (See Comments)    Unknown   . Paroxetine Other (See Comments)    Other reaction(s): Unknown (See Comments) unknown Unknown    Objective:   Vitals:   04/02/19 1143  Resp: 16  Temp: 98.7 F (37.1 C)   There is no height or weight on file to calculate BMI. Constitutional Well developed. Well nourished.  Vascular Foot warm and well perfused. Capillary refill normal to all digits.   Neurologic Normal speech. Oriented to person, place, and time. Epicritic sensation to light touch grossly present bilaterally.  Dermatologic 5th MPJ sites well healed bilateral foot. 5th met base right still with significant hyperkeratosis and 4x3 ulcer upon debridement. No probe to bone. No fluctuance, no crepitus. No erythema. No ascending cellulitis. No signs of acute infection.   Orthopedic: Tenderness to palpation noted about the left 5th met base.    Radiographs: Taken and reviewed c/w post op state no osseous erosion or periosteal reaction. Assessment:   1. Skin ulcer of right foot, limited to breakdown of skin ([place])   2. Post-operative state    Plan:  Patient was evaluated and treated and all questions answered.  S/p foot surgery bilaterally -Progressing as expected post-operatively. -XR: none -WB Status: WBAT in surgical shoes.  Pending custom  shoes. -Right foot ulcer excisionally debrided to bleeding viable tisuse. Covered under global.  No follow-ups on file.

## 2019-04-04 ENCOUNTER — Ambulatory Visit: Payer: Medicare Other | Admitting: Podiatry

## 2019-04-04 ENCOUNTER — Other Ambulatory Visit: Payer: Self-pay

## 2019-04-04 ENCOUNTER — Other Ambulatory Visit: Payer: Medicare Other

## 2019-04-04 DIAGNOSIS — L97511 Non-pressure chronic ulcer of other part of right foot limited to breakdown of skin: Secondary | ICD-10-CM | POA: Diagnosis not present

## 2019-04-04 NOTE — Progress Notes (Signed)
Subjective:  Patient ID: Katherine Herrera, female    DOB: 24-Oct-1966,  MRN: [id number]  Chief Complaint  Patient presents with  . Wound Check    Pt states right foot wound is looking worse and feeling worse. Pt denies fever/nausea/vomiting/chills and states no pus drainage.   53 y.o. female returns for post-op check. History as above.   Review of Systems: Negative except as noted in the HPI. Denies N/V/F/Ch.  Past Medical History:  Diagnosis Date  . Anxiety   . COPD (chronic obstructive pulmonary disease) ([place])   . DM (diabetes mellitus) ([place])    type 2  . Family history of adverse reaction to anesthesia    mother hard to wake up  . Gout    possible  . Hyperlipidemia   . Hypothyroidism   . LBP (low back pain)    DR Patrice Paradise  . Migraine   . Peripheral neuropathy   . Syncope    poss conversion disorder vs psychogenic seizures  . Tachycardia    diet coke overuse?  Marland Kitchen Tenosynovitis of foot and ankle 09/03/2013  . TTS (tarsal tunnel syndrome) 2010   Left Foot [facility] in [place]  . Vitamin D deficiency   . Wound, open    both feet outer parts no drainage, changes dressing 1 day, wounds dime size for last month    Current Outpatient Medications:  .  aspirin (BAYER ASPIRIN) 325 MG tablet, Take 1 tablet (325 mg total) by mouth daily., Disp: 100 tablet, Rfl: 3 .  B-D INS SYR ULTRAFINE 1CC/31G 31G X 5/16" 1 ML MISC, USE AS DIRECTED, Disp: 100 each, Rfl: 0 .  baclofen (LIORESAL) 10 MG tablet, Take 2 tablets (20 mg total) by mouth 2 (two) times daily., Disp: 180 tablet, Rfl: 1 .  Blood Glucose Monitoring Suppl (ONETOUCH VERIO) w/Device KIT, , Disp: , Rfl:  .  cholecalciferol (VITAMIN D) 25 MCG (1000 UT) tablet, Take 2 tablets (2,000 Units total) by mouth daily., Disp: 100 tablet, Rfl: 3 .  clindamycin (CLEOCIN) 300 MG capsule, Take 1 capsule (300 mg total) by mouth 2 (two) times daily., Disp: 14 capsule, Rfl: 0 .  clonazePAM (KLONOPIN) 0.5 MG tablet, Take 1 tablet (0.5 mg total) by mouth 2  (two) times daily as needed for anxiety., Disp: , Rfl:  .  docusate sodium (COLACE) 100 MG capsule, Take 200 mg by mouth every evening., Disp: , Rfl:  .  ezetimibe (ZETIA) 10 MG tablet, Take 1 tablet (10 mg total) by mouth daily., Disp: 90 tablet, Rfl: 3 .  gabapentin (NEURONTIN) 800 MG tablet, Take 1 tablet (800 mg total) by mouth 4 (four) times daily., Disp: 360 tablet, Rfl: 1 .  glucose blood (ONETOUCH VERIO) test strip, Use to check blood sugar 2 times per day., Disp: 100 each, Rfl: 12 .  insulin NPH-regular Human (70-30) 100 UNIT/ML injection, Inject 70 Units into the skin 2 (two) times daily with a meal., Disp: , Rfl:  .  Insulin Syringe-Needle U-100 (SURE COMFORT INSULIN SYRINGE) 31G X 5/16" 0.5 ML MISC, USE AS DIRECTED WITH INSULIN 4 TIMES A DAY, Disp: 400 each, Rfl: 2 .  lamoTRIgine (LAMICTAL) 25 MG tablet, TAKE 1 TABLET BY MOUTH TWO  TIMES DAILY, Disp: 180 tablet, Rfl: 1 .  Lancet Devices (ACCU-CHEK SOFTCLIX) lancets, Test two times daily, Disp: 100 each, Rfl: 12 .  levothyroxine (SYNTHROID, LEVOTHROID) 75 MCG tablet, TAKE 1 TABLET BY MOUTH  DAILY, Disp: 90 tablet, Rfl: 3 .  lisinopril (ZESTRIL) 10 MG tablet,  Take 1 tablet (10 mg total) by mouth daily., Disp: 90 tablet, Rfl: 3 .  loratadine (CLARITIN) 10 MG tablet, Take 10 mg by mouth daily as needed for allergies., Disp: , Rfl:  .  ONETOUCH DELICA LANCETS 31V MISC, TEST TWO TIMES DAILY, Disp: 200 each, Rfl: 2 .  oxyCODONE (ROXICODONE) 15 MG immediate release tablet, Take 1 tablet (15 mg total) by mouth every 6 (six) hours as needed for pain., Disp: 120 tablet, Rfl: 0 .  oxyCODONE (ROXICODONE) 5 MG immediate release tablet, Take 1 tablet (5 mg total) by mouth every 6 (six) hours as needed for severe pain., Disp: 120 tablet, Rfl: 0 .  rOPINIRole (REQUIP) 0.5 MG tablet, Take 1 tablet (0.5 mg total) by mouth at bedtime., Disp: 90 tablet, Rfl: 1 .  vitamin B-12 (CYANOCOBALAMIN) 1000 MCG tablet, Take 1,000 mcg by mouth daily.  , Disp: , Rfl:    Social History   Tobacco Use  Smoking Status Current Every Day Smoker  . Packs/day: 1.00  Smokeless Tobacco Never Used    Allergies  Allergen Reactions  . Hydromorphone Hcl Itching  . Cephalexin Nausea And Vomiting  . Demerol  [Meperidine Hcl] Rash  . Lovastatin Other (See Comments)    Other reaction(s): Unknown (See Comments) Possible myalgia Unknown   . Metformin And Related Nausea And Vomiting  . Sulfamethoxazole-Trimethoprim     Other reaction(s): Unknown DILI, pancreatitis  . Crestor [Rosuvastatin Calcium]     myalgia  . Niacin Other (See Comments)    Unknown   . Paroxetine Other (See Comments)    Other reaction(s): Unknown (See Comments) unknown Unknown    Objective:   There were no vitals filed for this visit. There is no height or weight on file to calculate BMI. Constitutional Well developed. Well nourished.  Vascular Foot warm and well perfused. Capillary refill normal to all digits.   Neurologic Normal speech. Oriented to person, place, and time. Epicritic sensation to light touch grossly present bilaterally.  Dermatologic 5th MPJ sites well healed bilateral foot. 5th met base wound improving 2x1 today with slight hyperkeratosis no warmth no redness to signs of infeciton.  Orthopedic: Tenderness to palpation noted about the left 5th met base.  Cavus foot type right   Radiographs: None Assessment:   1. Skin ulcer of right foot, limited to breakdown of skin [facility])    Plan:  Patient was evaluated and treated and all questions answered.  S/p foot surgery bilaterally -Wound improve since last eval. -WB Status: WBAT in surgical shoes.  Pending custom shoes. -Unna boot applied right foot for reduction of swelling.  Return in about 1 week (around 04/11/2019).

## 2019-04-10 ENCOUNTER — Other Ambulatory Visit: Payer: Self-pay

## 2019-04-10 ENCOUNTER — Encounter: Payer: Self-pay | Admitting: Internal Medicine

## 2019-04-10 ENCOUNTER — Ambulatory Visit (INDEPENDENT_AMBULATORY_CARE_PROVIDER_SITE_OTHER): Payer: Medicare Other | Admitting: Internal Medicine

## 2019-04-10 DIAGNOSIS — E1140 Type 2 diabetes mellitus with diabetic neuropathy, unspecified: Secondary | ICD-10-CM

## 2019-04-10 DIAGNOSIS — E1142 Type 2 diabetes mellitus with diabetic polyneuropathy: Secondary | ICD-10-CM

## 2019-04-10 DIAGNOSIS — E538 Deficiency of other specified B group vitamins: Secondary | ICD-10-CM

## 2019-04-10 DIAGNOSIS — M5440 Lumbago with sciatica, unspecified side: Secondary | ICD-10-CM | POA: Diagnosis not present

## 2019-04-10 DIAGNOSIS — F411 Generalized anxiety disorder: Secondary | ICD-10-CM | POA: Diagnosis not present

## 2019-04-10 MED ORDER — OXYCODONE HCL 15 MG PO TABS: 15.0000 mg | ORAL_TABLET | Freq: Four times a day (QID) | ORAL | 0 refills | Status: DC | PRN

## 2019-04-10 MED ORDER — CLONAZEPAM 0.5 MG PO TABS: 0.5000 mg | ORAL_TABLET | Freq: Two times a day (BID) | ORAL | 1 refills | Status: DC | PRN

## 2019-04-10 MED ORDER — OXYCODONE HCL 5 MG PO TABS: 5.0000 mg | ORAL_TABLET | Freq: Four times a day (QID) | ORAL | 0 refills | Status: DC | PRN

## 2019-04-10 NOTE — Assessment & Plan Note (Signed)
Chronic OA On Oxycodone 15 mg  Potential benefits of a long term opioids use as well as potential risks (i.e. addiction risk, apnea etc) and complications (i.e. Somnolence, constipation and others) were explained to the patient and were aknowledged. Risks of use w/benzodiazepines discussed  Do not take w/Clonazepam

## 2019-04-10 NOTE — Assessment & Plan Note (Signed)
On B12 

## 2019-04-10 NOTE — Assessment & Plan Note (Signed)
Improve CBG control 

## 2019-04-10 NOTE — Progress Notes (Signed)
Subjective:  Patient ID: Katherine Herrera, female    DOB: May 12, 1966  Age: 53 y.o. MRN: [id number]  CC: No chief complaint on file.   HPI SHOUA ULLOA presents for chronic pain, DM, foot surgeries  Outpatient Medications Prior to Visit  Medication Sig Dispense Refill   aspirin (BAYER ASPIRIN) 325 MG tablet Take 1 tablet (325 mg total) by mouth daily. 100 tablet 3   B-D INS SYR ULTRAFINE 1CC/31G 31G X 5/16" 1 ML MISC USE AS DIRECTED 100 each 0   baclofen (LIORESAL) 10 MG tablet Take 2 tablets (20 mg total) by mouth 2 (two) times daily. 180 tablet 1   Blood Glucose Monitoring Suppl (ONETOUCH VERIO) w/Device KIT      cholecalciferol (VITAMIN D) 25 MCG (1000 UT) tablet Take 2 tablets (2,000 Units total) by mouth daily. 100 tablet 3   clindamycin (CLEOCIN) 300 MG capsule Take 1 capsule (300 mg total) by mouth 2 (two) times daily. 14 capsule 0   clonazePAM (KLONOPIN) 0.5 MG tablet Take 1 tablet (0.5 mg total) by mouth 2 (two) times daily as needed for anxiety.     docusate sodium (COLACE) 100 MG capsule Take 200 mg by mouth every evening.     ezetimibe (ZETIA) 10 MG tablet Take 1 tablet (10 mg total) by mouth daily. 90 tablet 3   gabapentin (NEURONTIN) 800 MG tablet Take 1 tablet (800 mg total) by mouth 4 (four) times daily. 360 tablet 1   glucose blood (ONETOUCH VERIO) test strip Use to check blood sugar 2 times per day. 100 each 12   insulin NPH-regular Human (70-30) 100 UNIT/ML injection Inject 70 Units into the skin 2 (two) times daily with a meal.     Insulin Syringe-Needle U-100 (SURE COMFORT INSULIN SYRINGE) 31G X 5/16" 0.5 ML MISC USE AS DIRECTED WITH INSULIN 4 TIMES A DAY 400 each 2   lamoTRIgine (LAMICTAL) 25 MG tablet TAKE 1 TABLET BY MOUTH TWO  TIMES DAILY 180 tablet 1   Lancet Devices (ACCU-CHEK SOFTCLIX) lancets Test two times daily 100 each 12   levothyroxine (SYNTHROID, LEVOTHROID) 75 MCG tablet TAKE 1 TABLET BY MOUTH  DAILY 90 tablet 3   lisinopril (ZESTRIL)  10 MG tablet Take 1 tablet (10 mg total) by mouth daily. 90 tablet 3   loratadine (CLARITIN) 10 MG tablet Take 10 mg by mouth daily as needed for allergies.     ONETOUCH DELICA LANCETS 10F MISC TEST TWO TIMES DAILY 200 each 2   oxyCODONE (ROXICODONE) 15 MG immediate release tablet Take 1 tablet (15 mg total) by mouth every 6 (six) hours as needed for pain. 120 tablet 0   oxyCODONE (ROXICODONE) 5 MG immediate release tablet Take 1 tablet (5 mg total) by mouth every 6 (six) hours as needed for severe pain. 120 tablet 0   rOPINIRole (REQUIP) 0.5 MG tablet Take 1 tablet (0.5 mg total) by mouth at bedtime. 90 tablet 1   vitamin B-12 (CYANOCOBALAMIN) 1000 MCG tablet Take 1,000 mcg by mouth daily.       No facility-administered medications prior to visit.     ROS: Review of Systems  Constitutional: Positive for fatigue. Negative for activity change, appetite change, chills and unexpected weight change.  HENT: Negative for congestion, mouth sores and sinus pressure.   Eyes: Negative for visual disturbance.  Respiratory: Negative for cough and chest tightness.   Gastrointestinal: Negative for abdominal pain and nausea.  Genitourinary: Negative for difficulty urinating, frequency and vaginal pain.  Musculoskeletal:  Positive for arthralgias, back pain and gait problem.  Skin: Positive for color change and wound. Negative for pallor and rash.  Neurological: Negative for dizziness, tremors, weakness, numbness and headaches.  Psychiatric/Behavioral: Positive for sleep disturbance. Negative for confusion and suicidal ideas. The patient is nervous/anxious.     Objective:  BP (!) 156/82 (BP Location: Left Arm, Patient Position: Sitting, Cuff Size: Large)    Pulse 95    Temp 98 F (36.7 C) (Oral)    Ht '5\' 9"'  (1.753 m)    Wt 224 lb (101.6 kg)    SpO2 96%    BMI 33.08 kg/m   BP Readings from Last 3 Encounters:  04/10/19 (!) 156/82  01/10/19 120/69  01/02/19 102/60    Wt Readings from Last 3  Encounters:  04/10/19 224 lb (101.6 kg)  01/02/19 213 lb 12.8 oz (97 kg)  12/31/18 217 lb 9.6 oz (98.7 kg)    Physical Exam Constitutional:      General: She is not in acute distress.    Appearance: She is well-developed.  HENT:     Head: Normocephalic.     Right Ear: External ear normal.     Left Ear: External ear normal.     Nose: Nose normal.  Eyes:     General:        Right eye: No discharge.        Left eye: No discharge.     Conjunctiva/sclera: Conjunctivae normal.     Pupils: Pupils are equal, round, and reactive to light.  Neck:     Musculoskeletal: Normal range of motion and neck supple.     Thyroid: No thyromegaly.     Vascular: No JVD.     Trachea: No tracheal deviation.  Cardiovascular:     Rate and Rhythm: Normal rate and regular rhythm.     Heart sounds: Normal heart sounds.  Pulmonary:     Effort: No respiratory distress.     Breath sounds: No stridor. No wheezing.  Abdominal:     General: Bowel sounds are normal. There is no distension.     Palpations: Abdomen is soft. There is no mass.     Tenderness: There is no abdominal tenderness. There is no guarding or rebound.  Musculoskeletal:        General: Tenderness and deformity present.     Right lower leg: No edema.     Left lower leg: No edema.  Lymphadenopathy:     Cervical: No cervical adenopathy.  Skin:    Findings: No erythema or rash.  Neurological:     Mental Status: She is oriented to person, place, and time.     Cranial Nerves: No cranial nerve deficit.     Motor: No abnormal muscle tone.     Coordination: Coordination normal.     Gait: Gait abnormal.     Deep Tendon Reflexes: Reflexes normal.  Psychiatric:        Behavior: Behavior normal.        Thought Content: Thought content normal.        Judgment: Judgment normal.    B surgical shoes LS tender Obese  Lab Results  Component Value Date   WBC 10.4 02/20/2019   HGB 14.2 02/20/2019   HCT 42.3 02/20/2019   PLT 271.0 02/20/2019    GLUCOSE 208 (H) 02/20/2019   CHOL 223 (H) 07/19/2016   TRIG (H) 07/19/2016    527.0 Triglyceride is over 400; calculations on Lipids are invalid.   HDL  27.40 (L) 07/19/2016   LDLDIRECT 106.0 07/19/2016   LDLCALC 81 01/02/2013   ALT 14 12/19/2018   AST 13 12/19/2018   NA 135 02/20/2019   K 4.7 02/20/2019   CL 100 02/20/2019   CREATININE 0.96 02/20/2019   BUN 20 02/20/2019   CO2 30 02/20/2019   TSH 0.51 12/19/2018   INR 0.95 01/28/2014   HGBA1C 8.5 (H) 09/29/2018   MICROALBUR 0.9 07/19/2016    Ct Renal Stone Study  Result Date: 02/20/2019 CLINICAL DATA:  Right lower quadrant/flank pain EXAM: CT ABDOMEN AND PELVIS WITHOUT CONTRAST TECHNIQUE: Multidetector CT imaging of the abdomen and pelvis was performed following the standard protocol without oral or IV contrast. COMPARISON:  None. FINDINGS: Lower chest: There is slight posterior right base atelectasis. There is no lung base edema or consolidation. There are scattered foci of coronary artery calcification. Hepatobiliary: No focal liver lesions are evident on this noncontrast enhanced study. The gallbladder wall is not appreciably thickened. There is no biliary duct dilatation. Pancreas: There is no pancreatic mass or inflammatory focus. Spleen: No splenic lesions are evident. Adrenals/Urinary Tract: Adrenals bilaterally appear unremarkable. Kidneys bilaterally show no appreciable mass or hydronephrosis on either side. There is no evident renal or ureteral calculus on either side. Urinary bladder is midline with wall thickness upper normal for degree of bladder distention. Stomach/Bowel: There is no appreciable bowel wall or mesenteric thickening. Moderate stool is noted in the colon. There is no bowel obstruction. No free air or portal venous air. Vascular/Lymphatic: There is calcification in portions of the distal aorta as well as common iliac arteries. No aneurysm evident. There is a retroaortic left renal vein, an anatomic variant. There  is no adenopathy in the abdomen or pelvis. Reproductive: Uterus is absent.  There is no evident pelvic mass. Other: There is no periappendiceal region inflammatory change. No abscess or ascites is evident in the abdomen or pelvis. There is a small ventral hernia containing only fat. Musculoskeletal: There are foci of degenerative change in the lumbar spine. There are no blastic or lytic bone lesions. There is no appreciable intramuscular lesion. There is mild soft tissue stranding in the anterolateral left abdominal wall. No fluid collection in this area. IMPRESSION: 1. A cause for patient's symptoms has not been established with this study. 2. No evident renal or ureteral calculus. No hydronephrosis. Urinary bladder wall thickness is felt to be at the upper limit of normal for degree of distention. It may be prudent to correlate with urinalysis to assess for potential early cystitis. 3. No demonstrable bowel obstruction. No abscess in the abdomen or pelvis. No periappendiceal region inflammation. Appendix is not seen and by report is absent. 4. Mild soft tissue stranding in the anterolateral left abdominal wall which may represent a localized area of cellulitis. No fluid collection or abscess in this area. In the midline at the level of the umbilicus, there is a small ventral hernia containing only fat. 5. There is aortic and bilateral iliac artery atherosclerosis. There are scattered foci of coronary artery calcification. 6.  Uterus absent. Electronically Signed   By: Lowella Grip III M.D.   On: 02/20/2019 13:36    Assessment & Plan:   There are no diagnoses linked to this encounter.   No orders of the defined types were placed in this encounter.    Follow-up: No follow-ups on file.  Walker Kehr, MD

## 2019-04-10 NOTE — Assessment & Plan Note (Signed)
Clonazepam prn  Potential benefits of a long term benzodiazepines  use as well as potential risks  and complications were explained to the patient and were aknowledged.  

## 2019-04-10 NOTE — Assessment & Plan Note (Signed)
Better per pt A1c was >8%

## 2019-04-11 ENCOUNTER — Ambulatory Visit: Payer: Medicare Other | Admitting: Podiatry

## 2019-04-12 ENCOUNTER — Other Ambulatory Visit: Payer: Self-pay | Admitting: Podiatry

## 2019-04-12 ENCOUNTER — Ambulatory Visit (INDEPENDENT_AMBULATORY_CARE_PROVIDER_SITE_OTHER): Payer: Medicare Other | Admitting: Podiatry

## 2019-04-12 ENCOUNTER — Other Ambulatory Visit: Payer: Self-pay

## 2019-04-12 VITALS — Temp 97.7°F

## 2019-04-12 DIAGNOSIS — L97511 Non-pressure chronic ulcer of other part of right foot limited to breakdown of skin: Secondary | ICD-10-CM

## 2019-04-18 ENCOUNTER — Ambulatory Visit: Payer: Medicare Other | Admitting: Podiatry

## 2019-04-23 ENCOUNTER — Telehealth: Payer: Self-pay | Admitting: Internal Medicine

## 2019-04-23 NOTE — Telephone Encounter (Signed)
Medication: traMADol (ULTRAM) 50 MG tablet  Has the patient contacted their pharmacy? Yes.   (Agent: If no, request that the patient contact the pharmacy for the refill.) (Agent: If yes, when and what did the pharmacy advise?)  Preferred Pharmacy (with phone number or street name): [id number] - [place], [place] [phone] (Phone) [phone] (Fax)    Agent: Please be advised that RX refills may take up to 3 business days. We ask that you follow-up with your pharmacy.

## 2019-04-23 NOTE — Telephone Encounter (Signed)
Dohmeier is on oxycodone.  I will not prescribe tramadol.  Thanks

## 2019-04-23 NOTE — Telephone Encounter (Signed)
Last prescribed in 2016, please advise

## 2019-04-24 NOTE — Telephone Encounter (Signed)
Pt would like to know if there is something else she can take for her headaches since the tramadol seemed to be the only thing that helps.

## 2019-04-25 ENCOUNTER — Ambulatory Visit (INDEPENDENT_AMBULATORY_CARE_PROVIDER_SITE_OTHER): Payer: Medicare Other | Admitting: Podiatry

## 2019-04-25 ENCOUNTER — Other Ambulatory Visit: Payer: Self-pay

## 2019-04-25 DIAGNOSIS — L97511 Non-pressure chronic ulcer of other part of right foot limited to breakdown of skin: Secondary | ICD-10-CM

## 2019-04-25 NOTE — Progress Notes (Signed)
Subjective:  Patient ID: Katherine Herrera, female    DOB: Nov 16, 1965,  MRN: [id number]  No chief complaint on file.  53 y.o. female returns for post-op check. Doing well awaiting custom shoes feels like there is fluid in her right foot. Wearing surgical shoes.  Review of Systems: Negative except as noted in the HPI. Denies N/V/F/Ch.  Past Medical History:  Diagnosis Date  . Anxiety   . COPD (chronic obstructive pulmonary disease) ([place])   . DM (diabetes mellitus) ([place])    type 2  . Family history of adverse reaction to anesthesia    mother hard to wake up  . Gout    possible  . Hyperlipidemia   . Hypothyroidism   . LBP (low back pain)    DR Patrice Paradise  . Migraine   . Peripheral neuropathy   . Syncope    poss conversion disorder vs psychogenic seizures  . Tachycardia    diet coke overuse?  Marland Kitchen Tenosynovitis of foot and ankle 09/03/2013  . TTS (tarsal tunnel syndrome) 2010   Left Foot Vog[place] in [place]  . Vitamin D deficiency   . Wound, open    both feet outer parts no drainage, changes dressing 1 day, wounds dime size for last month    Current Outpatient Medications:  .  aspirin (BAYER ASPIRIN) 325 MG tablet, Take 1 tablet (325 mg total) by mouth daily., Disp: 100 tablet, Rfl: 3 .  B-D INS SYR ULTRAFINE 1CC/31G 31G X 5/16" 1 ML MISC, USE AS DIRECTED, Disp: 100 each, Rfl: 0 .  baclofen (LIORESAL) 10 MG tablet, Take 2 tablets (20 mg total) by mouth 2 (two) times daily., Disp: 180 tablet, Rfl: 1 .  Blood Glucose Monitoring Suppl (ONETOUCH VERIO) w/Device KIT, , Disp: , Rfl:  .  cholecalciferol (VITAMIN D) 25 MCG (1000 UT) tablet, Take 2 tablets (2,000 Units total) by mouth daily., Disp: 100 tablet, Rfl: 3 .  clindamycin (CLEOCIN) 300 MG capsule, Take 1 capsule (300 mg total) by mouth 2 (two) times daily., Disp: 14 capsule, Rfl: 0 .  clonazePAM (KLONOPIN) 0.5 MG tablet, Take 1 tablet (0.5 mg total) by mouth 2 (two) times daily as needed for anxiety., Disp: 30 tablet, Rfl: 1 .  docusate  sodium (COLACE) 100 MG capsule, Take 200 mg by mouth every evening., Disp: , Rfl:  .  ezetimibe (ZETIA) 10 MG tablet, Take 1 tablet (10 mg total) by mouth daily., Disp: 90 tablet, Rfl: 3 .  gabapentin (NEURONTIN) 800 MG tablet, Take 1 tablet (800 mg total) by mouth 4 (four) times daily., Disp: 360 tablet, Rfl: 1 .  glucose blood (ONETOUCH VERIO) test strip, Use to check blood sugar 2 times per day., Disp: 100 each, Rfl: 12 .  insulin NPH-regular Human (70-30) 100 UNIT/ML injection, Inject 70 Units into the skin 2 (two) times daily with a meal., Disp: , Rfl:  .  Insulin Syringe-Needle U-100 (SURE COMFORT INSULIN SYRINGE) 31G X 5/16" 0.5 ML MISC, USE AS DIRECTED WITH INSULIN 4 TIMES A DAY, Disp: 400 each, Rfl: 2 .  lamoTRIgine (LAMICTAL) 25 MG tablet, TAKE 1 TABLET BY MOUTH TWO  TIMES DAILY, Disp: 180 tablet, Rfl: 1 .  Lancet Devices (ACCU-CHEK SOFTCLIX) lancets, Test two times daily, Disp: 100 each, Rfl: 12 .  levothyroxine (SYNTHROID, LEVOTHROID) 75 MCG tablet, TAKE 1 TABLET BY MOUTH  DAILY, Disp: 90 tablet, Rfl: 3 .  lisinopril (ZESTRIL) 10 MG tablet, Take 1 tablet (10 mg total) by mouth daily., Disp: 90 tablet, Rfl: 3 .  loratadine (CLARITIN) 10 MG tablet, Take 10 mg by mouth daily as needed for allergies., Disp: , Rfl:  .  ONETOUCH DELICA LANCETS 17H MISC, TEST TWO TIMES DAILY, Disp: 200 each, Rfl: 2 .  oxyCODONE (ROXICODONE) 15 MG immediate release tablet, Take 1 tablet (15 mg total) by mouth every 6 (six) hours as needed for pain., Disp: 120 tablet, Rfl: 0 .  oxyCODONE (ROXICODONE) 5 MG immediate release tablet, Take 1 tablet (5 mg total) by mouth every 6 (six) hours as needed for severe pain., Disp: 120 tablet, Rfl: 0 .  rOPINIRole (REQUIP) 0.5 MG tablet, Take 1 tablet (0.5 mg total) by mouth at bedtime., Disp: 90 tablet, Rfl: 1 .  vitamin B-12 (CYANOCOBALAMIN) 1000 MCG tablet, Take 1,000 mcg by mouth daily.  , Disp: , Rfl:   Social History   Tobacco Use  Smoking Status Current Every Day  Smoker  . Packs/day: 1.00  Smokeless Tobacco Never Used    Allergies  Allergen Reactions  . Hydromorphone Hcl Itching  . Cephalexin Nausea And Vomiting  . Demerol  [Meperidine Hcl] Rash  . Lovastatin Other (See Comments)    Other reaction(s): Unknown (See Comments) Possible myalgia Unknown   . Metformin And Related Nausea And Vomiting  . Sulfamethoxazole-Trimethoprim     Other reaction(s): Unknown DILI, pancreatitis  . Crestor [Rosuvastatin Calcium]     myalgia  . Niacin Other (See Comments)    Unknown   . Paroxetine Other (See Comments)    Other reaction(s): Unknown (See Comments) unknown Unknown    Objective:   There were no vitals filed for this visit. There is no height or weight on file to calculate BMI. Constitutional Well developed. Well nourished.  Vascular Foot warm and well perfused. Capillary refill normal to all digits.   Neurologic Normal speech. Oriented to person, place, and time. Epicritic sensation to light touch grossly present bilaterally.  Dermatologic 5th MPJ sites well healed bilateral foot. 5th met base wound improving 1x1 without warmth erythema signs of acute infection.  Orthopedic: Tenderness to palpation noted about the left 5th met base.  Cavus foot type right   Radiographs: None Assessment:   1. Skin ulcer of right foot, limited to breakdown of skin [facility])    Plan:  Patient was evaluated and treated and all questions answered.  S/p foot surgery bilaterally -Wound improve since last eval. -WB Status: WBAT in surgical shoes.  Pending custom shoes. -Right foot wound debrided as below  Procedure: Selective Debridement of Wound Rationale: Removal of devitalized tissue from the wound to promote healing.  Pre-Debridement Wound Measurements: 1 cm x 1 cm x 0.2 cm  Post-Debridement Wound Measurements: same as pre-debridement. Type of Debridement: sharp selective Tissue Removed: Devitalized soft-tissue Dressing: Dry, sterile, compression  dressing. Disposition: Patient tolerated procedure well. Patient to return in 1 week for follow-up.    Return in about 1 month (around 05/25/2019).

## 2019-04-26 LAB — WOUND CULTURE
MICRO NUMBER:: 565799
SPECIMEN QUALITY:: ADEQUATE

## 2019-04-26 LAB — HOUSE ACCOUNT TRACKING

## 2019-04-30 NOTE — Progress Notes (Signed)
Subjective:  Patient ID: Katherine Herrera, female    DOB: [birth year],  MRN: [id number]  Chief Complaint  Patient presents with  . Wound Check    Pt states right foot lateral wound is painful but feels about the same as last visit. Pt states she has noticed some pus the past 4 days.   53 y.o. female returns for post-op check. History as above.   Review of Systems: Negative except as noted in the HPI. Denies N/V/F/Ch.  Past Medical History:  Diagnosis Date  . Anxiety   . COPD (chronic obstructive pulmonary disease) ([place])   . DM (diabetes mellitus) ([place])    type 2  . Family history of adverse reaction to anesthesia    mother hard to wake up  . Gout    possible  . Hyperlipidemia   . Hypothyroidism   . LBP (low back pain)    DR Patrice Paradise  . Migraine   . Peripheral neuropathy   . Syncope    poss conversion disorder vs psychogenic seizures  . Tachycardia    diet coke overuse?  Marland Kitchen Tenosynovitis of foot and ankle 09/03/2013  . TTS (tarsal tunnel syndrome) 2010   Left Foot [place] in [place]  . Vitamin D deficiency   . Wound, open    both feet outer parts no drainage, changes dressing 1 day, wounds dime size for last month    Current Outpatient Medications:  .  aspirin (BAYER ASPIRIN) 325 MG tablet, Take 1 tablet (325 mg total) by mouth daily., Disp: 100 tablet, Rfl: 3 .  B-D INS SYR ULTRAFINE 1CC/31G 31G X 5/16" 1 ML MISC, USE AS DIRECTED, Disp: 100 each, Rfl: 0 .  baclofen (LIORESAL) 10 MG tablet, Take 2 tablets (20 mg total) by mouth 2 (two) times daily., Disp: 180 tablet, Rfl: 1 .  Blood Glucose Monitoring Suppl (ONETOUCH VERIO) w/Device KIT, , Disp: , Rfl:  .  cholecalciferol (VITAMIN D) 25 MCG (1000 UT) tablet, Take 2 tablets (2,000 Units total) by mouth daily., Disp: 100 tablet, Rfl: 3 .  clindamycin (CLEOCIN) 300 MG capsule, Take 1 capsule (300 mg total) by mouth 2 (two) times daily., Disp: 14 capsule, Rfl: 0 .  clonazePAM (KLONOPIN) 0.5 MG tablet, Take 1 tablet (0.5 mg total) by  mouth 2 (two) times daily as needed for anxiety., Disp: 30 tablet, Rfl: 1 .  docusate sodium (COLACE) 100 MG capsule, Take 200 mg by mouth every evening., Disp: , Rfl:  .  ezetimibe (ZETIA) 10 MG tablet, Take 1 tablet (10 mg total) by mouth daily., Disp: 90 tablet, Rfl: 3 .  gabapentin (NEURONTIN) 800 MG tablet, Take 1 tablet (800 mg total) by mouth 4 (four) times daily., Disp: 360 tablet, Rfl: 1 .  glucose blood (ONETOUCH VERIO) test strip, Use to check blood sugar 2 times per day., Disp: 100 each, Rfl: 12 .  insulin NPH-regular Human (70-30) 100 UNIT/ML injection, Inject 70 Units into the skin 2 (two) times daily with a meal., Disp: , Rfl:  .  Insulin Syringe-Needle U-100 (SURE COMFORT INSULIN SYRINGE) 31G X 5/16" 0.5 ML MISC, USE AS DIRECTED WITH INSULIN 4 TIMES A DAY, Disp: 400 each, Rfl: 2 .  lamoTRIgine (LAMICTAL) 25 MG tablet, TAKE 1 TABLET BY MOUTH TWO  TIMES DAILY, Disp: 180 tablet, Rfl: 1 .  Lancet Devices (ACCU-CHEK SOFTCLIX) lancets, Test two times daily, Disp: 100 each, Rfl: 12 .  levothyroxine (SYNTHROID, LEVOTHROID) 75 MCG tablet, TAKE 1 TABLET BY MOUTH  DAILY, Disp: 90 tablet,  Rfl: 3 .  lisinopril (ZESTRIL) 10 MG tablet, Take 1 tablet (10 mg total) by mouth daily., Disp: 90 tablet, Rfl: 3 .  loratadine (CLARITIN) 10 MG tablet, Take 10 mg by mouth daily as needed for allergies., Disp: , Rfl:  .  ONETOUCH DELICA LANCETS 94V MISC, TEST TWO TIMES DAILY, Disp: 200 each, Rfl: 2 .  oxyCODONE (ROXICODONE) 15 MG immediate release tablet, Take 1 tablet (15 mg total) by mouth every 6 (six) hours as needed for pain., Disp: 120 tablet, Rfl: 0 .  oxyCODONE (ROXICODONE) 5 MG immediate release tablet, Take 1 tablet (5 mg total) by mouth every 6 (six) hours as needed for severe pain., Disp: 120 tablet, Rfl: 0 .  rOPINIRole (REQUIP) 0.5 MG tablet, Take 1 tablet (0.5 mg total) by mouth at bedtime., Disp: 90 tablet, Rfl: 1 .  vitamin B-12 (CYANOCOBALAMIN) 1000 MCG tablet, Take 1,000 mcg by mouth daily.   , Disp: , Rfl:   Social History   Tobacco Use  Smoking Status Current Every Day Smoker  . Packs/day: 1.00  Smokeless Tobacco Never Used    Allergies  Allergen Reactions  . Hydromorphone Hcl Itching  . Cephalexin Nausea And Vomiting  . Demerol  [Meperidine Hcl] Rash  . Lovastatin Other (See Comments)    Other reaction(s): Unknown (See Comments) Possible myalgia Unknown   . Metformin And Related Nausea And Vomiting  . Sulfamethoxazole-Trimethoprim     Other reaction(s): Unknown DILI, pancreatitis  . Crestor [Rosuvastatin Calcium]     myalgia  . Niacin Other (See Comments)    Unknown   . Paroxetine Other (See Comments)    Other reaction(s): Unknown (See Comments) unknown Unknown    Objective:   Vitals:   04/12/19 1636  Temp: 97.7 F (36.5 C)   There is no height or weight on file to calculate BMI. Constitutional Well developed. Well nourished.  Vascular Foot warm and well perfused. Capillary refill normal to all digits.   Neurologic Normal speech. Oriented to person, place, and time. Epicritic sensation to light touch grossly present bilaterally.  Dermatologic 5th MPJ sites well healed bilateral foot. 5th met base wound with thick drainage without frank purulence. No warmth erythema signs of acute infection.  Orthopedic: Tenderness to palpation noted about the left 5th met base.  Cavus foot type right   Radiographs: None Assessment:   1. Skin ulcer of right foot, limited to breakdown of skin [facility])    Plan:  Patient was evaluated and treated and all questions answered.  S/p foot surgery bilaterally -Drainage noted about the wound cultured. No frank purulence. No need for abx therapy at this time. -WB Status: WBAT in surgical shoes.  Pending custom shoes.  No follow-ups on file.

## 2019-05-02 NOTE — Telephone Encounter (Signed)
I do not see a reply message

## 2019-05-02 NOTE — Telephone Encounter (Signed)
Can try Exedrin prn Thx

## 2019-05-02 NOTE — Telephone Encounter (Signed)
LM notifying pt

## 2019-05-17 ENCOUNTER — Telehealth: Payer: Self-pay | Admitting: Podiatry

## 2019-05-17 NOTE — Telephone Encounter (Signed)
Noted thanks °

## 2019-05-17 NOTE — Telephone Encounter (Signed)
Custom shoes in and I called pt to schedule her to see Liliane Channel to pick up and she is out of town and will just pick them up on 7.30.2020 when she is scheduled to see Dr March Rummage and I added to Rick's schedule as well.

## 2019-05-24 ENCOUNTER — Other Ambulatory Visit: Payer: Self-pay

## 2019-05-27 ENCOUNTER — Telehealth: Payer: Self-pay | Admitting: *Deleted

## 2019-05-27 NOTE — Telephone Encounter (Signed)
Left message for patient to call back to find out what patient is needing. CRM created.

## 2019-05-28 ENCOUNTER — Ambulatory Visit (HOSPITAL_COMMUNITY)
Admission: RE | Admit: 2019-05-28 | Payer: Medicare Other | Source: Ambulatory Visit | Attending: Cardiovascular Disease | Admitting: Cardiovascular Disease

## 2019-05-29 NOTE — Telephone Encounter (Signed)
Patient states she was given 5MG  of her Oxycodone and she usually gets 15MG 's. She has been taking 3 5mg 's and would like to know why she was prescribed 5MG  when she has been getting 15MG  for foever.

## 2019-05-30 ENCOUNTER — Encounter: Payer: Self-pay | Admitting: Podiatry

## 2019-05-30 ENCOUNTER — Other Ambulatory Visit: Payer: Self-pay

## 2019-05-30 ENCOUNTER — Ambulatory Visit (INDEPENDENT_AMBULATORY_CARE_PROVIDER_SITE_OTHER): Payer: Medicare Other | Admitting: Orthotics

## 2019-05-30 ENCOUNTER — Ambulatory Visit (INDEPENDENT_AMBULATORY_CARE_PROVIDER_SITE_OTHER): Payer: Medicare Other | Admitting: Podiatry

## 2019-05-30 VITALS — Temp 97.4°F

## 2019-05-30 DIAGNOSIS — E1142 Type 2 diabetes mellitus with diabetic polyneuropathy: Secondary | ICD-10-CM

## 2019-05-30 DIAGNOSIS — M21961 Unspecified acquired deformity of right lower leg: Secondary | ICD-10-CM

## 2019-05-30 DIAGNOSIS — Q6671 Congenital pes cavus, right foot: Secondary | ICD-10-CM

## 2019-05-30 DIAGNOSIS — E11621 Type 2 diabetes mellitus with foot ulcer: Secondary | ICD-10-CM

## 2019-05-30 DIAGNOSIS — Z9889 Other specified postprocedural states: Secondary | ICD-10-CM

## 2019-05-30 DIAGNOSIS — M898X7 Other specified disorders of bone, ankle and foot: Secondary | ICD-10-CM

## 2019-05-30 DIAGNOSIS — L97511 Non-pressure chronic ulcer of other part of right foot limited to breakdown of skin: Secondary | ICD-10-CM | POA: Diagnosis not present

## 2019-05-30 DIAGNOSIS — L97429 Non-pressure chronic ulcer of left heel and midfoot with unspecified severity: Secondary | ICD-10-CM

## 2019-05-30 MED ORDER — OXYCODONE HCL 15 MG PO TABS: 15.0000 mg | ORAL_TABLET | Freq: Four times a day (QID) | ORAL | 0 refills | Status: DC | PRN

## 2019-05-30 NOTE — Telephone Encounter (Signed)
Corrected Thx 

## 2019-05-30 NOTE — Progress Notes (Signed)
Patient came in today to pick up custom diabetic shoes and custom inserts.  Same was well pleased with fit and function.   The patient could ambulate without any discomfort; there were no signs of any quality issues. The foot ortheses offered full contact with plantar surface and contoured the arch well.   The shoes fit well with no heel slippage and areas of pressure concern.   Patient advised to contact us if any problems arise.  Patient also advised on how to report any issues.

## 2019-05-31 NOTE — Telephone Encounter (Signed)
Left detailed message informing pt of below.  

## 2019-06-02 NOTE — Progress Notes (Signed)
Subjective:  Patient ID: Katherine Herrera, female    DOB: 10-18-1966,  MRN: [id number]  Chief Complaint  Patient presents with  . Foot Pain    right foot pain f/u, pt states that she is doing a lot better, but is still concerned that she is not healing as fast as she would like to.   53 y.o. female returns for post-op check.  Frustrated that the wound is nonhealing but otherwise her wounds have remained healed and she is not had issues with them.  Review of Systems: Negative except as noted in the HPI. Denies N/V/F/Ch.  Past Medical History:  Diagnosis Date  . Anxiety   . COPD (chronic obstructive pulmonary disease) ([place])   . DM (diabetes mellitus) ([place])    type 2  . Family history of adverse reaction to anesthesia    mother hard to wake up  . Gout    possible  . Hyperlipidemia   . Hypothyroidism   . LBP (low back pain)    DR Patrice Paradise  . Migraine   . Peripheral neuropathy   . Syncope    poss conversion disorder vs psychogenic seizures  . Tachycardia    diet coke overuse?  Marland Kitchen Tenosynovitis of foot and ankle 09/03/2013  . TTS (tarsal tunnel syndrome) 2010   Left Foot Vogler in [place]  . Vitamin D deficiency   . Wound, open    both feet outer parts no drainage, changes dressing 1 day, wounds dime size for last month    Current Outpatient Medications:  .  aspirin (BAYER ASPIRIN) 325 MG tablet, Take 1 tablet (325 mg total) by mouth daily., Disp: 100 tablet, Rfl: 3 .  B-D INS SYR ULTRAFINE 1CC/31G 31G X 5/16" 1 ML MISC, USE AS DIRECTED, Disp: 100 each, Rfl: 0 .  baclofen (LIORESAL) 10 MG tablet, Take 2 tablets (20 mg total) by mouth 2 (two) times daily., Disp: 180 tablet, Rfl: 1 .  Blood Glucose Monitoring Suppl (ONETOUCH VERIO) w/Device KIT, , Disp: , Rfl:  .  cholecalciferol (VITAMIN D) 25 MCG (1000 UT) tablet, Take 2 tablets (2,000 Units total) by mouth daily., Disp: 100 tablet, Rfl: 3 .  clindamycin (CLEOCIN) 300 MG capsule, Take 1 capsule (300 mg total) by mouth 2 (two) times  daily., Disp: 14 capsule, Rfl: 0 .  clonazePAM (KLONOPIN) 0.5 MG tablet, Take 1 tablet (0.5 mg total) by mouth 2 (two) times daily as needed for anxiety., Disp: 30 tablet, Rfl: 1 .  docusate sodium (COLACE) 100 MG capsule, Take 200 mg by mouth every evening., Disp: , Rfl:  .  ezetimibe (ZETIA) 10 MG tablet, Take 1 tablet (10 mg total) by mouth daily., Disp: 90 tablet, Rfl: 3 .  gabapentin (NEURONTIN) 800 MG tablet, Take 1 tablet (800 mg total) by mouth 4 (four) times daily., Disp: 360 tablet, Rfl: 1 .  glucose blood (ONETOUCH VERIO) test strip, Use to check blood sugar 2 times per day., Disp: 100 each, Rfl: 12 .  insulin NPH-regular Human (70-30) 100 UNIT/ML injection, Inject 70 Units into the skin 2 (two) times daily with a meal., Disp: , Rfl:  .  Insulin Syringe-Needle U-100 (SURE COMFORT INSULIN SYRINGE) 31G X 5/16" 0.5 ML MISC, USE AS DIRECTED WITH INSULIN 4 TIMES A DAY, Disp: 400 each, Rfl: 2 .  lamoTRIgine (LAMICTAL) 25 MG tablet, TAKE 1 TABLET BY MOUTH TWO  TIMES DAILY, Disp: 180 tablet, Rfl: 1 .  Lancet Devices (ACCU-CHEK SOFTCLIX) lancets, Test two times daily, Disp: 100 each,  Rfl: 12 .  levothyroxine (SYNTHROID, LEVOTHROID) 75 MCG tablet, TAKE 1 TABLET BY MOUTH  DAILY, Disp: 90 tablet, Rfl: 3 .  lisinopril (ZESTRIL) 10 MG tablet, Take 1 tablet (10 mg total) by mouth daily., Disp: 90 tablet, Rfl: 3 .  loratadine (CLARITIN) 10 MG tablet, Take 10 mg by mouth daily as needed for allergies., Disp: , Rfl:  .  ONETOUCH DELICA LANCETS 29G MISC, TEST TWO TIMES DAILY, Disp: 200 each, Rfl: 2 .  rOPINIRole (REQUIP) 0.5 MG tablet, Take 1 tablet (0.5 mg total) by mouth at bedtime., Disp: 90 tablet, Rfl: 1 .  vitamin B-12 (CYANOCOBALAMIN) 1000 MCG tablet, Take 1,000 mcg by mouth daily.  , Disp: , Rfl:  .  oxyCODONE (ROXICODONE) 15 MG immediate release tablet, Take 1 tablet (15 mg total) by mouth every 6 (six) hours as needed for pain., Disp: 120 tablet, Rfl: 0  Social History   Tobacco Use  Smoking  Status Current Every Day Smoker  . Packs/day: 1.00  Smokeless Tobacco Never Used    Allergies  Allergen Reactions  . Hydromorphone Hcl Itching  . Cephalexin Nausea And Vomiting  . Demerol  [Meperidine Hcl] Rash  . Lovastatin Other (See Comments)    Other reaction(s): Unknown (See Comments) Possible myalgia Unknown   . Metformin And Related Nausea And Vomiting  . Sulfamethoxazole-Trimethoprim     Other reaction(s): Unknown DILI, pancreatitis  . Crestor [Rosuvastatin Calcium]     myalgia  . Niacin Other (See Comments)    Unknown   . Paroxetine Other (See Comments)    Other reaction(s): Unknown (See Comments) unknown Unknown    Objective:   Vitals:   05/30/19 1554  Temp: (!) 97.4 F (36.3 C)   There is no height or weight on file to calculate BMI. Constitutional Well developed. Well nourished.  Vascular Foot warm and well perfused. Capillary refill normal to all digits.   Neurologic Normal speech. Oriented to person, place, and time. Epicritic sensation to light touch grossly present bilaterally.  Dermatologic 5th MPJ sites well healed bilateral foot. 5th met base wound impro operative  Orthopedic: Tenderness to palpation noted about the left 5th met base.  Cavus foot type right   Radiographs: None Assessment:   1. Skin ulcer of right foot, limited to breakdown of skin [facility])    Plan:  Patient was evaluated and treated and all questions answered.  S/p foot surgery bilaterally -Trial custom shoes to see if they will provide adequate offloading. -Debridement of the right fifth wound.  Left foot wound  Procedure: Excisional Debridement of Wound Rationale: Removal of non-viable soft tissue from the wound to promote healing.  Anesthesia: none Pre-Debridement Wound Measurements: 0.5 cm x 0.5 cm x 0.2 cm  Post-Debridement Wound Measurements: 1.5 cm x 1 cm x 0.2 cm  Type of Debridement: Sharp Excisional Tissue Removed: Non-viable soft tissue Depth of Debridement:  subcutaneous tissue. Technique: Sharp excisional debridement to bleeding, viable wound base.  Dressing: Dry, sterile, compression dressing. Disposition: Patient tolerated procedure well. Patient to return in 1 week for follow-up.     No follow-ups on file.

## 2019-06-05 ENCOUNTER — Telehealth: Payer: Self-pay | Admitting: Podiatry

## 2019-06-05 MED ORDER — DOXYCYCLINE HYCLATE 100 MG PO CAPS: 100.0000 mg | ORAL_CAPSULE | Freq: Two times a day (BID) | ORAL | 0 refills | Status: DC

## 2019-06-05 NOTE — Telephone Encounter (Signed)
Left message informing pt the antibiotic was ordered by Dr. March Rummage and called to [id number].

## 2019-06-05 NOTE — Addendum Note (Signed)
Addended by: Harriett Sine D on: 06/05/2019 04:07 PM   Modules accepted: Orders

## 2019-06-05 NOTE — Telephone Encounter (Signed)
Pt called stating she has an open sore on her foot that seems to be infected and has some odor coming from it. Pt would like to know if the doctor can prescribe her an antibiotic.  Pt was told she would need to come in to be seen due to the possible infection and odor from the wound. Pt is scheduled for an appt tomorrow, 06/06/19 @ 2:00PM

## 2019-06-05 NOTE — Telephone Encounter (Signed)
Dr. March Rummage states order Doxycycline 100mg  capsules #6 one capsule bid.

## 2019-06-06 ENCOUNTER — Other Ambulatory Visit: Payer: Self-pay

## 2019-06-06 ENCOUNTER — Ambulatory Visit (INDEPENDENT_AMBULATORY_CARE_PROVIDER_SITE_OTHER): Payer: Medicare Other | Admitting: Podiatry

## 2019-06-06 ENCOUNTER — Ambulatory Visit (INDEPENDENT_AMBULATORY_CARE_PROVIDER_SITE_OTHER): Payer: Medicare Other

## 2019-06-06 DIAGNOSIS — L97511 Non-pressure chronic ulcer of other part of right foot limited to breakdown of skin: Secondary | ICD-10-CM

## 2019-06-06 DIAGNOSIS — L928 Other granulomatous disorders of the skin and subcutaneous tissue: Secondary | ICD-10-CM | POA: Diagnosis not present

## 2019-06-06 NOTE — Progress Notes (Signed)
Subjective:  Patient ID: Katherine Herrera, female    DOB: September 05, 1966,  MRN: [id number]  Chief Complaint  Patient presents with  . Diabetic Ulcer    1 week follow up, right foot   53 y.o. female returns for post-op check.  Wearing her custom shoes. Thought the wound had an odor the other day and was concerned. Called in and was given a prescription for doxycycline which she is taking.  Review of Systems: Negative except as noted in the HPI. Denies N/V/F/Ch.  Past Medical History:  Diagnosis Date  . Anxiety   . COPD (chronic obstructive pulmonary disease) ([place])   . DM (diabetes mellitus) ([place])    type 2  . Family history of adverse reaction to anesthesia    mother hard to wake up  . Gout    possible  . Hyperlipidemia   . Hypothyroidism   . LBP (low back pain)    DR Patrice Paradise  . Migraine   . Peripheral neuropathy   . Syncope    poss conversion disorder vs psychogenic seizures  . Tachycardia    diet coke overuse?  Marland Kitchen Tenosynovitis of foot and ankle 09/03/2013  . TTS (tarsal tunnel syndrome) 2010   Left Foot [place] in [place]  . Vitamin D deficiency   . Wound, open    both feet outer parts no drainage, changes dressing 1 day, wounds dime size for last month    Current Outpatient Medications:  .  aspirin (BAYER ASPIRIN) 325 MG tablet, Take 1 tablet (325 mg total) by mouth daily., Disp: 100 tablet, Rfl: 3 .  B-D INS SYR ULTRAFINE 1CC/31G 31G X 5/16" 1 ML MISC, USE AS DIRECTED, Disp: 100 each, Rfl: 0 .  baclofen (LIORESAL) 10 MG tablet, Take 2 tablets (20 mg total) by mouth 2 (two) times daily., Disp: 180 tablet, Rfl: 1 .  Blood Glucose Monitoring Suppl (ONETOUCH VERIO) w/Device KIT, , Disp: , Rfl:  .  cholecalciferol (VITAMIN D) 25 MCG (1000 UT) tablet, Take 2 tablets (2,000 Units total) by mouth daily., Disp: 100 tablet, Rfl: 3 .  clindamycin (CLEOCIN) 300 MG capsule, Take 1 capsule (300 mg total) by mouth 2 (two) times daily., Disp: 14 capsule, Rfl: 0 .  clonazePAM (KLONOPIN) 0.5 MG  tablet, Take 1 tablet (0.5 mg total) by mouth 2 (two) times daily as needed for anxiety., Disp: 30 tablet, Rfl: 1 .  docusate sodium (COLACE) 100 MG capsule, Take 200 mg by mouth every evening., Disp: , Rfl:  .  doxycycline (VIBRAMYCIN) 100 MG capsule, Take 1 capsule (100 mg total) by mouth 2 (two) times daily., Disp: 6 capsule, Rfl: 0 .  ezetimibe (ZETIA) 10 MG tablet, Take 1 tablet (10 mg total) by mouth daily., Disp: 90 tablet, Rfl: 3 .  gabapentin (NEURONTIN) 800 MG tablet, Take 1 tablet (800 mg total) by mouth 4 (four) times daily., Disp: 360 tablet, Rfl: 1 .  glucose blood (ONETOUCH VERIO) test strip, Use to check blood sugar 2 times per day., Disp: 100 each, Rfl: 12 .  insulin NPH-regular Human (70-30) 100 UNIT/ML injection, Inject 70 Units into the skin 2 (two) times daily with a meal., Disp: , Rfl:  .  Insulin Syringe-Needle U-100 (SURE COMFORT INSULIN SYRINGE) 31G X 5/16" 0.5 ML MISC, USE AS DIRECTED WITH INSULIN 4 TIMES A DAY, Disp: 400 each, Rfl: 2 .  lamoTRIgine (LAMICTAL) 25 MG tablet, TAKE 1 TABLET BY MOUTH TWO  TIMES DAILY, Disp: 180 tablet, Rfl: 1 .  Lancet Devices (ACCU-CHEK  SOFTCLIX) lancets, Test two times daily, Disp: 100 each, Rfl: 12 .  levothyroxine (SYNTHROID, LEVOTHROID) 75 MCG tablet, TAKE 1 TABLET BY MOUTH  DAILY, Disp: 90 tablet, Rfl: 3 .  lisinopril (ZESTRIL) 10 MG tablet, Take 1 tablet (10 mg total) by mouth daily., Disp: 90 tablet, Rfl: 3 .  loratadine (CLARITIN) 10 MG tablet, Take 10 mg by mouth daily as needed for allergies., Disp: , Rfl:  .  ONETOUCH DELICA LANCETS 98X MISC, TEST TWO TIMES DAILY, Disp: 200 each, Rfl: 2 .  oxyCODONE (ROXICODONE) 15 MG immediate release tablet, Take 1 tablet (15 mg total) by mouth every 6 (six) hours as needed for pain., Disp: 120 tablet, Rfl: 0 .  rOPINIRole (REQUIP) 0.5 MG tablet, Take 1 tablet (0.5 mg total) by mouth at bedtime., Disp: 90 tablet, Rfl: 1 .  vitamin B-12 (CYANOCOBALAMIN) 1000 MCG tablet, Take 1,000 mcg by mouth  daily.  , Disp: , Rfl:   Social History   Tobacco Use  Smoking Status Current Every Day Smoker  . Packs/day: 1.00  Smokeless Tobacco Never Used    Allergies  Allergen Reactions  . Hydromorphone Hcl Itching  . Cephalexin Nausea And Vomiting  . Demerol  [Meperidine Hcl] Rash  . Lovastatin Other (See Comments)    Other reaction(s): Unknown (See Comments) Possible myalgia Unknown   . Metformin And Related Nausea And Vomiting  . Sulfamethoxazole-Trimethoprim     Other reaction(s): Unknown DILI, pancreatitis  . Crestor [Rosuvastatin Calcium]     myalgia  . Niacin Other (See Comments)    Unknown   . Paroxetine Other (See Comments)    Other reaction(s): Unknown (See Comments) unknown Unknown    Objective:   There were no vitals filed for this visit. There is no height or weight on file to calculate BMI. Constitutional Well developed. Well nourished.  Vascular Foot warm and well perfused. Capillary refill normal to all digits.   Neurologic Normal speech. Oriented to person, place, and time. Epicritic sensation to light touch grossly present bilaterally.  Dermatologic 5th MPJ sites well healed bilateral foot. 5th met base wound with hyperkeratosis, granular wound base. No warmth, erythema, signs of infection   Orthopedic: Tenderness to palpation noted about the left 5th met base.  Cavus foot type right   Radiographs: None Assessment:   1. Skin ulcer of right foot, limited to breakdown of skin ([place])   2. Other granulomatous disorders of the skin and subcutaneous tissue    Plan:  Patient was evaluated and treated and all questions answered.  Right 5th Met Base wound -Debrided as below -Dressed with medihoney and DSD -Overall I think that the custom shoe is helping heel debridement as the wound has less hyperkeratosis today.  We applied silver nitrate to the wound today to promote epithelialization.  We will check the wound next week to check progress.  Should the wound  persist despite conservative therapy would consider possible excision.  She does not have much peroneus brevis function in her foot is quite adductovarus position.  I do not think that sacrificing the peroneus brevis attachment would significantly alter the mechanics of her foot.  Were we to resect the fifth metatarsal base would consider tenodesis of the peroneals at the cuboid to preserve some function however.  We discussed this today with the patient and will monitor progress with the new shoes and discuss surgery if indicated.  Procedure: Chemical Cauterization of Granulation Tissue Rationale: Cauterize granular wound base to promote healing.  Wound Measurements: 1.5  cm x 1 cm x 0.2 cm  Instrumentation: Silver nitrate stick x3 Dressing: Dry, sterile, compression dressing. Disposition: Patient tolerated procedure well. Patient to return in 1 week for follow-up.    Return in about 1 week (around 06/13/2019) for Wound Care, Right.

## 2019-06-13 ENCOUNTER — Ambulatory Visit (INDEPENDENT_AMBULATORY_CARE_PROVIDER_SITE_OTHER): Payer: Medicare Other | Admitting: Podiatry

## 2019-06-13 DIAGNOSIS — Z5329 Procedure and treatment not carried out because of patient's decision for other reasons: Secondary | ICD-10-CM

## 2019-06-13 NOTE — Progress Notes (Signed)
   Complete physical exam  Patient: Katherine Herrera   DOB: 08/20/1999   53 y.o. Female  MRN: [id number]  Subjective:    No chief complaint on file.   Katherine Herrera is a 53 y.o. female who presents today for a complete physical exam. She reports consuming a {diet types:17450} diet. {types:19826} She generally feels {DESC; WELL/FAIRLY WELL/POORLY:18703}. She reports sleeping {DESC; WELL/FAIRLY WELL/POORLY:18703}. She {does/does not:200015} have additional problems to discuss today.    Most recent fall risk assessment:    04/27/2022   10:42 AM  Fall Risk   Falls in the past year? 0  Number falls in past yr: 0  Injury with Fall? 0  Risk for fall due to : No Fall Risks  Follow up Falls evaluation completed     Most recent depression screenings:    04/27/2022   10:42 AM 03/18/2021   10:46 AM  PHQ 2/9 Scores  PHQ - 2 Score 0 0  PHQ- 9 Score 5     {VISON DENTAL STD PSA (Optional):27386}  {History (Optional):23778}  Patient Care Team: Jessup, Joy, NP as PCP - General (Nurse Practitioner)   Outpatient Medications Prior to Visit  Medication Sig   fluticasone (FLONASE) 50 MCG/ACT nasal spray Place 2 sprays into both nostrils in the morning and at bedtime. After 7 days, reduce to once daily.   norgestimate-ethinyl estradiol (SPRINTEC 28) 0.25-35 MG-MCG tablet Take 1 tablet by mouth daily.   Nystatin POWD Apply liberally to affected area 2 times per day   spironolactone (ALDACTONE) 100 MG tablet Take 1 tablet (100 mg total) by mouth daily.   No facility-administered medications prior to visit.    ROS        Objective:     There were no vitals taken for this visit. {Vitals History (Optional):23777}  Physical Exam   No results found for any visits on 06/02/22. {Show previous labs (optional):23779}    Assessment & Plan:    Routine Health Maintenance and Physical Exam  Immunization History  Administered Date(s) Administered   DTaP 11/03/1999, 12/30/1999,  03/09/2000, 11/23/2000, 06/08/2004   Hepatitis A 04/04/2008, 04/10/2009   Hepatitis B 08/21/1999, 09/28/1999, 03/09/2000   HiB (PRP-OMP) 11/03/1999, 12/30/1999, 03/09/2000, 11/23/2000   IPV 11/03/1999, 12/30/1999, 08/28/2000, 06/08/2004   Influenza,inj,Quad PF,6+ Mos 07/11/2014   Influenza-Unspecified 10/10/2012   MMR 08/28/2001, 06/08/2004   Meningococcal Polysaccharide 04/09/2012   Pneumococcal Conjugate-13 11/23/2000   Pneumococcal-Unspecified 03/09/2000, 05/23/2000   Tdap 04/09/2012   Varicella 08/28/2000, 04/04/2008    Health Maintenance  Topic Date Due   HIV Screening  Never done   Hepatitis C Screening  Never done   INFLUENZA VACCINE  05/31/2022   PAP-Cervical Cytology Screening  06/02/2022 (Originally 08/19/2020)   PAP SMEAR-Modifier  06/02/2022 (Originally 08/19/2020)   TETANUS/TDAP  06/02/2022 (Originally 04/09/2022)   HPV VACCINES  Discontinued   COVID-19 Vaccine  Discontinued    Discussed health benefits of physical activity, and encouraged her to engage in regular exercise appropriate for her age and condition.  Problem List Items Addressed This Visit   None Visit Diagnoses     Annual physical exam    -  Primary   Cervical cancer screening       Need for Tdap vaccination          No follow-ups on file.     Joy Jessup, NP   

## 2019-06-18 ENCOUNTER — Ambulatory Visit: Payer: Medicare Other | Admitting: Internal Medicine

## 2019-06-21 ENCOUNTER — Ambulatory Visit: Payer: Medicare Other | Admitting: Podiatry

## 2019-06-27 ENCOUNTER — Telehealth: Payer: Self-pay | Admitting: Podiatry

## 2019-06-27 DIAGNOSIS — I10 Essential (primary) hypertension: Secondary | ICD-10-CM | POA: Diagnosis not present

## 2019-06-27 DIAGNOSIS — N189 Chronic kidney disease, unspecified: Secondary | ICD-10-CM | POA: Diagnosis not present

## 2019-06-27 DIAGNOSIS — E7800 Pure hypercholesterolemia, unspecified: Secondary | ICD-10-CM | POA: Diagnosis not present

## 2019-06-27 DIAGNOSIS — E1140 Type 2 diabetes mellitus with diabetic neuropathy, unspecified: Secondary | ICD-10-CM | POA: Diagnosis not present

## 2019-06-27 DIAGNOSIS — E039 Hypothyroidism, unspecified: Secondary | ICD-10-CM | POA: Diagnosis not present

## 2019-06-27 DIAGNOSIS — E1165 Type 2 diabetes mellitus with hyperglycemia: Secondary | ICD-10-CM | POA: Diagnosis not present

## 2019-06-27 NOTE — Telephone Encounter (Signed)
Pt thinks foot might be infected and is requesting an antibiotic.  [facility] [phone].

## 2019-06-27 NOTE — Telephone Encounter (Signed)
Thanks

## 2019-06-27 NOTE — Telephone Encounter (Signed)
I called Katherine Herrera and informed that she had not shown up for her last appt, and Dr. March Rummage would like to see her before prescribing an antibiotic.

## 2019-06-28 ENCOUNTER — Encounter: Payer: Self-pay | Admitting: Podiatry

## 2019-06-28 ENCOUNTER — Other Ambulatory Visit: Payer: Self-pay

## 2019-06-28 ENCOUNTER — Ambulatory Visit (INDEPENDENT_AMBULATORY_CARE_PROVIDER_SITE_OTHER): Payer: Medicare Other

## 2019-06-28 ENCOUNTER — Ambulatory Visit (INDEPENDENT_AMBULATORY_CARE_PROVIDER_SITE_OTHER): Payer: Medicare Other | Admitting: Podiatry

## 2019-06-28 VITALS — Temp 98.1°F

## 2019-06-28 DIAGNOSIS — L97511 Non-pressure chronic ulcer of other part of right foot limited to breakdown of skin: Secondary | ICD-10-CM

## 2019-06-28 DIAGNOSIS — L03115 Cellulitis of right lower limb: Secondary | ICD-10-CM | POA: Diagnosis not present

## 2019-06-28 MED ORDER — CLINDAMYCIN HCL 300 MG PO CAPS: 300.0000 mg | ORAL_CAPSULE | Freq: Two times a day (BID) | ORAL | 0 refills | Status: DC

## 2019-06-28 MED ORDER — CIPROFLOXACIN HCL 500 MG PO TABS: 500.0000 mg | ORAL_TABLET | Freq: Two times a day (BID) | ORAL | 0 refills | Status: DC

## 2019-07-01 ENCOUNTER — Encounter: Payer: Self-pay | Admitting: Internal Medicine

## 2019-07-01 ENCOUNTER — Ambulatory Visit (INDEPENDENT_AMBULATORY_CARE_PROVIDER_SITE_OTHER): Payer: Medicare Other | Admitting: Internal Medicine

## 2019-07-01 ENCOUNTER — Other Ambulatory Visit: Payer: Self-pay

## 2019-07-01 VITALS — BP 118/66 | HR 83 | Temp 98.2°F | Ht 69.0 in | Wt 229.0 lb

## 2019-07-01 DIAGNOSIS — E538 Deficiency of other specified B group vitamins: Secondary | ICD-10-CM | POA: Diagnosis not present

## 2019-07-01 DIAGNOSIS — E1140 Type 2 diabetes mellitus with diabetic neuropathy, unspecified: Secondary | ICD-10-CM

## 2019-07-01 DIAGNOSIS — E11621 Type 2 diabetes mellitus with foot ulcer: Secondary | ICD-10-CM

## 2019-07-01 DIAGNOSIS — E1142 Type 2 diabetes mellitus with diabetic polyneuropathy: Secondary | ICD-10-CM

## 2019-07-01 DIAGNOSIS — M79671 Pain in right foot: Secondary | ICD-10-CM

## 2019-07-01 DIAGNOSIS — M5440 Lumbago with sciatica, unspecified side: Secondary | ICD-10-CM

## 2019-07-01 DIAGNOSIS — Z23 Encounter for immunization: Secondary | ICD-10-CM

## 2019-07-01 DIAGNOSIS — L97404 Non-pressure chronic ulcer of unspecified heel and midfoot with necrosis of bone: Secondary | ICD-10-CM

## 2019-07-01 MED ORDER — OXYCODONE HCL 15 MG PO TABS: 15.0000 mg | ORAL_TABLET | Freq: Four times a day (QID) | ORAL | 0 refills | Status: DC | PRN

## 2019-07-01 MED ORDER — CLONAZEPAM 0.5 MG PO TABS: 0.5000 mg | ORAL_TABLET | Freq: Two times a day (BID) | ORAL | 1 refills | Status: DC | PRN

## 2019-07-01 NOTE — Assessment & Plan Note (Signed)
Dr March Rummage this wk On abx

## 2019-07-01 NOTE — Assessment & Plan Note (Signed)
R -- f/u w/Dr March Rummage: on two abx now

## 2019-07-01 NOTE — Assessment & Plan Note (Signed)
On B12 

## 2019-07-01 NOTE — Assessment & Plan Note (Signed)
Chronic OA On Oxycodone 15 mg  Potential benefits of a long term opioids use as well as potential risks (i.e. addiction risk, apnea etc) and complications (i.e. Somnolence, constipation and others) were explained to the patient and were aknowledged. Risks of use w/benzodiazepines discussed  Do not take w/Clonazepam 

## 2019-07-01 NOTE — Assessment & Plan Note (Signed)
F/u w/ Dr March Rummage, Dr Chalmers Cater

## 2019-07-01 NOTE — Assessment & Plan Note (Signed)
F/u w/Dr Balan 

## 2019-07-01 NOTE — Progress Notes (Signed)
Subjective:  Patient ID: Katherine Herrera, female    DOB: Jul 21, 1966  Age: 52 y.o. MRN: [id number]  CC: No chief complaint on file.   HPI Katherine Herrera presents for chronic LBP, anxiety, DM f/u  Outpatient Medications Prior to Visit  Medication Sig Dispense Refill   aspirin (BAYER ASPIRIN) 325 MG tablet Take 1 tablet (325 mg total) by mouth daily. 100 tablet 3   B-D INS SYR ULTRAFINE 1CC/31G 31G X 5/16" 1 ML MISC USE AS DIRECTED 100 each 0   baclofen (LIORESAL) 10 MG tablet Take 2 tablets (20 mg total) by mouth 2 (two) times daily. 180 tablet 1   Blood Glucose Monitoring Suppl (ONETOUCH VERIO) w/Device KIT      cholecalciferol (VITAMIN D) 25 MCG (1000 UT) tablet Take 2 tablets (2,000 Units total) by mouth daily. 100 tablet 3   ciprofloxacin (CIPRO) 500 MG tablet Take 1 tablet (500 mg total) by mouth 2 (two) times daily. 14 tablet 0   clindamycin (CLEOCIN) 300 MG capsule Take 1 capsule (300 mg total) by mouth 2 (two) times daily. 14 capsule 0   clonazePAM (KLONOPIN) 0.5 MG tablet Take 1 tablet (0.5 mg total) by mouth 2 (two) times daily as needed for anxiety. 30 tablet 1   docusate sodium (COLACE) 100 MG capsule Take 200 mg by mouth every evening.     doxycycline (VIBRAMYCIN) 100 MG capsule Take 1 capsule (100 mg total) by mouth 2 (two) times daily. 6 capsule 0   ezetimibe (ZETIA) 10 MG tablet Take 1 tablet (10 mg total) by mouth daily. 90 tablet 3   gabapentin (NEURONTIN) 800 MG tablet Take 1 tablet (800 mg total) by mouth 4 (four) times daily. 360 tablet 1   glucose blood (ONETOUCH VERIO) test strip Use to check blood sugar 2 times per day. 100 each 12   insulin NPH-regular Human (70-30) 100 UNIT/ML injection Inject 70 Units into the skin 2 (two) times daily with a meal.     Insulin Syringe-Needle U-100 (SURE COMFORT INSULIN SYRINGE) 31G X 5/16" 0.5 ML MISC USE AS DIRECTED WITH INSULIN 4 TIMES A DAY 400 each 2   lamoTRIgine (LAMICTAL) 25 MG tablet TAKE 1 TABLET BY MOUTH  TWO  TIMES DAILY 180 tablet 1   Lancet Devices (ACCU-CHEK SOFTCLIX) lancets Test two times daily 100 each 12   levothyroxine (SYNTHROID, LEVOTHROID) 75 MCG tablet TAKE 1 TABLET BY MOUTH  DAILY 90 tablet 3   lisinopril (ZESTRIL) 10 MG tablet Take 1 tablet (10 mg total) by mouth daily. 90 tablet 3   loratadine (CLARITIN) 10 MG tablet Take 10 mg by mouth daily as needed for allergies.     ONETOUCH DELICA LANCETS 69C MISC TEST TWO TIMES DAILY 200 each 2   oxyCODONE (ROXICODONE) 15 MG immediate release tablet Take 1 tablet (15 mg total) by mouth every 6 (six) hours as needed for pain. 120 tablet 0   rOPINIRole (REQUIP) 0.5 MG tablet Take 1 tablet (0.5 mg total) by mouth at bedtime. 90 tablet 1   vitamin B-12 (CYANOCOBALAMIN) 1000 MCG tablet Take 1,000 mcg by mouth daily.       No facility-administered medications prior to visit.     ROS: Review of Systems  Constitutional: Negative for activity change, appetite change, chills, fatigue and unexpected weight change.  HENT: Negative for congestion, mouth sores and sinus pressure.   Eyes: Negative for visual disturbance.  Respiratory: Negative for cough and chest tightness.   Gastrointestinal: Negative for abdominal pain  and nausea.  Genitourinary: Negative for difficulty urinating, frequency and vaginal pain.  Musculoskeletal: Positive for arthralgias, back pain and gait problem.  Skin: Positive for wound. Negative for pallor and rash.  Neurological: Negative for dizziness, tremors, weakness, numbness and headaches.  Psychiatric/Behavioral: Negative for confusion, sleep disturbance and suicidal ideas. The patient is nervous/anxious.     Objective:  BP 118/66 (BP Location: Left Arm, Patient Position: Sitting, Cuff Size: Large)    Pulse 83    Temp 98.2 F (36.8 C) (Oral)    Ht _0  (1.753 m)    Wt 229 lb (103.9 kg)    SpO2 94%    BMI 33.82 kg/m   BP Readings from Last 3 Encounters:  07/01/19 118/66  04/10/19 (!) 156/82  01/10/19  120/69    Wt Readings from Last 3 Encounters:  07/01/19 229 lb (103.9 kg)  04/10/19 224 lb (101.6 kg)  01/02/19 213 lb 12.8 oz (97 kg)    Physical Exam Constitutional:      General: She is not in acute distress.    Appearance: She is well-developed. She is obese.  HENT:     Head: Normocephalic.     Right Ear: External ear normal.     Left Ear: External ear normal.     Nose: Nose normal.  Eyes:     General:        Right eye: No discharge.        Left eye: No discharge.     Conjunctiva/sclera: Conjunctivae normal.     Pupils: Pupils are equal, round, and reactive to light.  Neck:     Musculoskeletal: Normal range of motion and neck supple.     Thyroid: No thyromegaly.     Vascular: No JVD.     Trachea: No tracheal deviation.  Cardiovascular:     Rate and Rhythm: Normal rate and regular rhythm.     Heart sounds: Normal heart sounds.  Pulmonary:     Effort: No respiratory distress.     Breath sounds: No stridor. No wheezing.  Abdominal:     General: Bowel sounds are normal. There is no distension.     Palpations: Abdomen is soft. There is no mass.     Tenderness: There is no abdominal tenderness. There is no guarding or rebound.  Musculoskeletal:        General: Swelling, tenderness and deformity present.     Right lower leg: Edema present.     Left lower leg: Edema present.  Lymphadenopathy:     Cervical: No cervical adenopathy.  Skin:    Findings: No erythema or rash.  Neurological:     Cranial Nerves: No cranial nerve deficit.     Motor: No abnormal muscle tone.     Coordination: Coordination normal.     Gait: Gait abnormal.     Deep Tendon Reflexes: Reflexes normal.  Psychiatric:        Behavior: Behavior normal.        Thought Content: Thought content normal.        Judgment: Judgment normal.   B surg shoes R foot wound - dressed; R ankle w/trace edema, erythema - not new per pt Lab Results  Component Value Date   WBC 10.4 02/20/2019   HGB 14.2  02/20/2019   HCT 42.3 02/20/2019   PLT 271.0 02/20/2019   GLUCOSE 208 (H) 02/20/2019   CHOL 223 (H) 07/19/2016   TRIG (H) 07/19/2016    527.0 Triglyceride is over 400; calculations on  Lipids are invalid.   HDL 27.40 (L) 07/19/2016   LDLDIRECT 106.0 07/19/2016   LDLCALC 81 01/02/2013   ALT 14 12/19/2018   AST 13 12/19/2018   NA 135 02/20/2019   K 4.7 02/20/2019   CL 100 02/20/2019   CREATININE 0.96 02/20/2019   BUN 20 02/20/2019   CO2 30 02/20/2019   TSH 0.51 12/19/2018   INR 0.95 01/28/2014   HGBA1C 8.5 (H) 09/29/2018   MICROALBUR 0.9 07/19/2016    Ct Renal Stone Study  Result Date: 02/20/2019 CLINICAL DATA:  Right lower quadrant/flank pain EXAM: CT ABDOMEN AND PELVIS WITHOUT CONTRAST TECHNIQUE: Multidetector CT imaging of the abdomen and pelvis was performed following the standard protocol without oral or IV contrast. COMPARISON:  None. FINDINGS: Lower chest: There is slight posterior right base atelectasis. There is no lung base edema or consolidation. There are scattered foci of coronary artery calcification. Hepatobiliary: No focal liver lesions are evident on this noncontrast enhanced study. The gallbladder wall is not appreciably thickened. There is no biliary duct dilatation. Pancreas: There is no pancreatic mass or inflammatory focus. Spleen: No splenic lesions are evident. Adrenals/Urinary Tract: Adrenals bilaterally appear unremarkable. Kidneys bilaterally show no appreciable mass or hydronephrosis on either side. There is no evident renal or ureteral calculus on either side. Urinary bladder is midline with wall thickness upper normal for degree of bladder distention. Stomach/Bowel: There is no appreciable bowel wall or mesenteric thickening. Moderate stool is noted in the colon. There is no bowel obstruction. No free air or portal venous air. Vascular/Lymphatic: There is calcification in portions of the distal aorta as well as common iliac arteries. No aneurysm evident. There is  a retroaortic left renal vein, an anatomic variant. There is no adenopathy in the abdomen or pelvis. Reproductive: Uterus is absent.  There is no evident pelvic mass. Other: There is no periappendiceal region inflammatory change. No abscess or ascites is evident in the abdomen or pelvis. There is a small ventral hernia containing only fat. Musculoskeletal: There are foci of degenerative change in the lumbar spine. There are no blastic or lytic bone lesions. There is no appreciable intramuscular lesion. There is mild soft tissue stranding in the anterolateral left abdominal wall. No fluid collection in this area. IMPRESSION: 1. A cause for patient's symptoms has not been established with this study. 2. No evident renal or ureteral calculus. No hydronephrosis. Urinary bladder wall thickness is felt to be at the upper limit of normal for degree of distention. It may be prudent to correlate with urinalysis to assess for potential early cystitis. 3. No demonstrable bowel obstruction. No abscess in the abdomen or pelvis. No periappendiceal region inflammation. Appendix is not seen and by report is absent. 4. Mild soft tissue stranding in the anterolateral left abdominal wall which may represent a localized area of cellulitis. No fluid collection or abscess in this area. In the midline at the level of the umbilicus, there is a small ventral hernia containing only fat. 5. There is aortic and bilateral iliac artery atherosclerosis. There are scattered foci of coronary artery calcification. 6.  Uterus absent. Electronically Signed   By: Lowella Grip III M.D.   On: 02/20/2019 13:36    Assessment & Plan:   Diagnoses and all orders for this visit:  Need for influenza vaccination -     Flu Vaccine QUAD 36+ mos IM     No orders of the defined types were placed in this encounter.    Follow-up: No follow-ups on  file.  Walker Kehr, MD

## 2019-07-04 ENCOUNTER — Ambulatory Visit (INDEPENDENT_AMBULATORY_CARE_PROVIDER_SITE_OTHER): Payer: Medicare Other | Admitting: Podiatry

## 2019-07-04 ENCOUNTER — Encounter (HOSPITAL_COMMUNITY): Payer: Self-pay | Admitting: *Deleted

## 2019-07-04 ENCOUNTER — Inpatient Hospital Stay (HOSPITAL_COMMUNITY)
Admission: AD | Admit: 2019-07-04 | Discharge: 2019-07-10 | DRG: 617 | Disposition: A | Discharge status: Home Health | Payer: Medicare Other | Source: Ambulatory Visit | Attending: Internal Medicine | Admitting: Internal Medicine

## 2019-07-04 ENCOUNTER — Other Ambulatory Visit: Payer: Self-pay

## 2019-07-04 VITALS — Temp 97.9°F

## 2019-07-04 DIAGNOSIS — Z79890 Hormone replacement therapy (postmenopausal): Secondary | ICD-10-CM

## 2019-07-04 DIAGNOSIS — N179 Acute kidney failure, unspecified: Secondary | ICD-10-CM | POA: Diagnosis present

## 2019-07-04 DIAGNOSIS — R52 Pain, unspecified: Secondary | ICD-10-CM | POA: Diagnosis not present

## 2019-07-04 DIAGNOSIS — L97404 Non-pressure chronic ulcer of unspecified heel and midfoot with necrosis of bone: Secondary | ICD-10-CM

## 2019-07-04 DIAGNOSIS — E039 Hypothyroidism, unspecified: Secondary | ICD-10-CM | POA: Diagnosis present

## 2019-07-04 DIAGNOSIS — Z885 Allergy status to narcotic agent status: Secondary | ICD-10-CM

## 2019-07-04 DIAGNOSIS — Z833 Family history of diabetes mellitus: Secondary | ICD-10-CM

## 2019-07-04 DIAGNOSIS — E034 Atrophy of thyroid (acquired): Secondary | ICD-10-CM | POA: Diagnosis not present

## 2019-07-04 DIAGNOSIS — Z8279 Family history of other congenital malformations, deformations and chromosomal abnormalities: Secondary | ICD-10-CM

## 2019-07-04 DIAGNOSIS — E669 Obesity, unspecified: Secondary | ICD-10-CM | POA: Diagnosis present

## 2019-07-04 DIAGNOSIS — Z888 Allergy status to other drugs, medicaments and biological substances status: Secondary | ICD-10-CM

## 2019-07-04 DIAGNOSIS — E11621 Type 2 diabetes mellitus with foot ulcer: Secondary | ICD-10-CM | POA: Diagnosis not present

## 2019-07-04 DIAGNOSIS — F411 Generalized anxiety disorder: Secondary | ICD-10-CM | POA: Diagnosis present

## 2019-07-04 DIAGNOSIS — G40909 Epilepsy, unspecified, not intractable, without status epilepticus: Secondary | ICD-10-CM | POA: Diagnosis present

## 2019-07-04 DIAGNOSIS — E785 Hyperlipidemia, unspecified: Secondary | ICD-10-CM | POA: Diagnosis not present

## 2019-07-04 DIAGNOSIS — G8929 Other chronic pain: Secondary | ICD-10-CM | POA: Diagnosis present

## 2019-07-04 DIAGNOSIS — D1801 Hemangioma of skin and subcutaneous tissue: Secondary | ICD-10-CM | POA: Diagnosis not present

## 2019-07-04 DIAGNOSIS — J449 Chronic obstructive pulmonary disease, unspecified: Secondary | ICD-10-CM | POA: Diagnosis not present

## 2019-07-04 DIAGNOSIS — L03115 Cellulitis of right lower limb: Secondary | ICD-10-CM | POA: Diagnosis not present

## 2019-07-04 DIAGNOSIS — Z7984 Long term (current) use of oral hypoglycemic drugs: Secondary | ICD-10-CM | POA: Diagnosis not present

## 2019-07-04 DIAGNOSIS — Z809 Family history of malignant neoplasm, unspecified: Secondary | ICD-10-CM | POA: Diagnosis not present

## 2019-07-04 DIAGNOSIS — E0842 Diabetes mellitus due to underlying condition with diabetic polyneuropathy: Secondary | ICD-10-CM | POA: Diagnosis not present

## 2019-07-04 DIAGNOSIS — M7671 Peroneal tendinitis, right leg: Secondary | ICD-10-CM | POA: Diagnosis not present

## 2019-07-04 DIAGNOSIS — L97511 Non-pressure chronic ulcer of other part of right foot limited to breakdown of skin: Secondary | ICD-10-CM | POA: Diagnosis not present

## 2019-07-04 DIAGNOSIS — I10 Essential (primary) hypertension: Secondary | ICD-10-CM | POA: Diagnosis not present

## 2019-07-04 DIAGNOSIS — F419 Anxiety disorder, unspecified: Secondary | ICD-10-CM | POA: Diagnosis present

## 2019-07-04 DIAGNOSIS — L97514 Non-pressure chronic ulcer of other part of right foot with necrosis of bone: Secondary | ICD-10-CM | POA: Diagnosis present

## 2019-07-04 DIAGNOSIS — M109 Gout, unspecified: Secondary | ICD-10-CM | POA: Diagnosis not present

## 2019-07-04 DIAGNOSIS — E1169 Type 2 diabetes mellitus with other specified complication: Principal | ICD-10-CM | POA: Diagnosis present

## 2019-07-04 DIAGNOSIS — E1140 Type 2 diabetes mellitus with diabetic neuropathy, unspecified: Secondary | ICD-10-CM | POA: Diagnosis not present

## 2019-07-04 DIAGNOSIS — L97519 Non-pressure chronic ulcer of other part of right foot with unspecified severity: Secondary | ICD-10-CM | POA: Diagnosis present

## 2019-07-04 DIAGNOSIS — E08621 Diabetes mellitus due to underlying condition with foot ulcer: Secondary | ICD-10-CM | POA: Diagnosis not present

## 2019-07-04 DIAGNOSIS — M549 Dorsalgia, unspecified: Secondary | ICD-10-CM | POA: Diagnosis present

## 2019-07-04 DIAGNOSIS — Z20828 Contact with and (suspected) exposure to other viral communicable diseases: Secondary | ICD-10-CM | POA: Diagnosis present

## 2019-07-04 DIAGNOSIS — Z882 Allergy status to sulfonamides status: Secondary | ICD-10-CM | POA: Diagnosis not present

## 2019-07-04 DIAGNOSIS — L97509 Non-pressure chronic ulcer of other part of unspecified foot with unspecified severity: Secondary | ICD-10-CM | POA: Diagnosis present

## 2019-07-04 DIAGNOSIS — Z8249 Family history of ischemic heart disease and other diseases of the circulatory system: Secondary | ICD-10-CM

## 2019-07-04 DIAGNOSIS — M19071 Primary osteoarthritis, right ankle and foot: Secondary | ICD-10-CM | POA: Diagnosis not present

## 2019-07-04 DIAGNOSIS — M869 Osteomyelitis, unspecified: Secondary | ICD-10-CM | POA: Diagnosis not present

## 2019-07-04 DIAGNOSIS — Z6833 Body mass index (BMI) 33.0-33.9, adult: Secondary | ICD-10-CM

## 2019-07-04 DIAGNOSIS — Z794 Long term (current) use of insulin: Secondary | ICD-10-CM | POA: Diagnosis not present

## 2019-07-04 DIAGNOSIS — E11622 Type 2 diabetes mellitus with other skin ulcer: Secondary | ICD-10-CM | POA: Diagnosis not present

## 2019-07-04 DIAGNOSIS — Z9889 Other specified postprocedural states: Secondary | ICD-10-CM

## 2019-07-04 DIAGNOSIS — G2581 Restless legs syndrome: Secondary | ICD-10-CM | POA: Diagnosis present

## 2019-07-04 LAB — CBC
HCT: 44.8 % (ref 36.0–46.0)
Hemoglobin: 14.7 g/dL (ref 12.0–15.0)
MCH: 28.7 pg (ref 26.0–34.0)
MCHC: 32.8 g/dL (ref 30.0–36.0)
MCV: 87.3 fL (ref 80.0–100.0)
Platelets: 321 10*3/uL (ref 150–400)
RBC: 5.13 MIL/uL — ABNORMAL HIGH (ref 3.87–5.11)
RDW: 13.8 % (ref 11.5–15.5)
WBC: 12.6 10*3/uL — ABNORMAL HIGH (ref 4.0–10.5)
nRBC: 0 % (ref 0.0–0.2)

## 2019-07-04 LAB — GLUCOSE, CAPILLARY: Glucose-Capillary: 315 mg/dL — ABNORMAL HIGH (ref 70–99)

## 2019-07-04 MED ORDER — ONDANSETRON HCL 4 MG/2ML IJ SOLN: 4.0000 mg | Freq: Four times a day (QID) | INTRAMUSCULAR | Status: DC | PRN

## 2019-07-04 MED ORDER — ONDANSETRON HCL 4 MG PO TABS: 4.0000 mg | ORAL_TABLET | Freq: Four times a day (QID) | ORAL | Status: DC | PRN

## 2019-07-04 MED ORDER — KETOROLAC TROMETHAMINE 30 MG/ML IJ SOLN
30.0000 mg | Freq: Four times a day (QID) | INTRAMUSCULAR | Status: DC | PRN
  Administered 2019-07-05: 30 mg via INTRAVENOUS
  Filled 2019-07-04: qty 1

## 2019-07-04 MED ORDER — ENOXAPARIN SODIUM 40 MG/0.4ML ~~LOC~~ SOLN
40.0000 mg | SUBCUTANEOUS | Status: DC
  Administered 2019-07-05 – 2019-07-10 (×6): 40 mg via SUBCUTANEOUS
  Filled 2019-07-04 (×6): qty 0.4

## 2019-07-04 MED ORDER — SODIUM CHLORIDE 0.9 % IV SOLN
INTRAVENOUS | Status: DC
  Administered 2019-07-04 – 2019-07-07 (×5): via INTRAVENOUS

## 2019-07-04 MED ORDER — INSULIN ASPART 100 UNIT/ML ~~LOC~~ SOLN
0.0000 [IU] | Freq: Every day | SUBCUTANEOUS | Status: DC
  Administered 2019-07-04: 4 [IU] via SUBCUTANEOUS
  Administered 2019-07-05 – 2019-07-06 (×2): 3 [IU] via SUBCUTANEOUS
  Administered 2019-07-07: 2 [IU] via SUBCUTANEOUS
  Administered 2019-07-08: 3 [IU] via SUBCUTANEOUS

## 2019-07-04 MED ORDER — INSULIN ASPART 100 UNIT/ML ~~LOC~~ SOLN
0.0000 [IU] | Freq: Three times a day (TID) | SUBCUTANEOUS | Status: DC
  Administered 2019-07-05: 7 [IU] via SUBCUTANEOUS
  Administered 2019-07-05 (×2): 4 [IU] via SUBCUTANEOUS
  Administered 2019-07-06: 15 [IU] via SUBCUTANEOUS
  Administered 2019-07-06: 4 [IU] via SUBCUTANEOUS
  Administered 2019-07-06: 11 [IU] via SUBCUTANEOUS
  Administered 2019-07-07 (×2): 7 [IU] via SUBCUTANEOUS
  Administered 2019-07-07 – 2019-07-08 (×3): 4 [IU] via SUBCUTANEOUS
  Administered 2019-07-08: 7 [IU] via SUBCUTANEOUS
  Administered 2019-07-09: 13:00:00 15 [IU] via SUBCUTANEOUS
  Administered 2019-07-09: 7 [IU] via SUBCUTANEOUS
  Administered 2019-07-09 – 2019-07-10 (×2): 4 [IU] via SUBCUTANEOUS
  Administered 2019-07-10: 11 [IU] via SUBCUTANEOUS

## 2019-07-04 MED ORDER — VANCOMYCIN HCL 10 G IV SOLR
2000.0000 mg | Freq: Once | INTRAVENOUS | Status: AC
  Administered 2019-07-05: 2000 mg via INTRAVENOUS
  Filled 2019-07-04: qty 2000

## 2019-07-04 NOTE — H&P (Signed)
History and Physical   Katherine Herrera TTS:[id number] DOB: 1966/05/22 DOA: 07/04/2019  Referring MD/NP/PA: Dr. March Rummage, podiatry  PCP: Cassandria Anger, MD   Outpatient Specialists: Dr. March Rummage podiatry  Patient coming from: Home, direct admit  Chief Complaint: Right foot ulcer and cellulitis  HPI: Katherine Herrera is a 53 y.o. female with medical history significant of diabetes, hyperlipidemia, hypothyroidism, COPD who was sent over for direct admission by podiatry.  Patient went to see them with right lower extremity diabetic ulcer and is swelling.  She was seen last week and evaluated.  She had imaging showing no osteomyelitis back then.  Patient initiated on ciprofloxacin and clindamycin.  She came back for a visit this week with cellulitis of the foot and legs.  There was worry of possible osteo-that was masked by the x-ray last week.  Patient therefore was sent over for direct admission to evaluate and treat since she failed outpatient treatment.  She denied any fever or chills denied any nausea vomiting or diarrhea.  Patient has continued to take her antibiotics prior to coming back.  Blood sugar has been largely controlled.  No injury.  No exposure to any body was COVID-19.  Podiatry will follow-up.  Recommendation for ruling out DVT and osteo-.   Review of Systems: As per HPI otherwise 10 point review of systems negative.    Past Medical History:  Diagnosis Date  . Anxiety   . COPD (chronic obstructive pulmonary disease) ([place])   . DM (diabetes mellitus) ([place])    type 2  . Family history of adverse reaction to anesthesia    mother hard to wake up  . Gout    possible  . Hyperlipidemia   . Hypothyroidism   . LBP (low back pain)    DR Patrice [place]  . Migraine   . Peripheral neuropathy   . Syncope    poss conversion disorder vs psychogenic seizures  . Tachycardia    diet coke overuse?  Marland Kitchen Tenosynovitis of foot and ankle 09/03/2013  . TTS (tarsal tunnel syndrome) 2010   Left Foot  [facility] in [place]  . Vitamin D deficiency   . Wound, open    both feet outer parts no drainage, changes dressing 1 day, wounds dime size for last month    Past Surgical History:  Procedure Laterality Date  . ABDOMINAL HYSTERECTOMY    . CARPAL TUNNEL RELEASE  2010   LEFT  . CERVICAL FUSION     C4-5  . METATARSAL HEAD EXCISION Bilateral 01/02/2019   Procedure: Left foot 5th metatarsal resection, wound excision, adjacent tissue transfer. Right fifth metatarsal head resection, fitfth metatarsal base resection, intermediate wound repair.;  Surgeon: Evelina Bucy, DPM;  Location: [facility];  Service: Podiatry;  Laterality: Bilateral;     reports that she has been smoking. She has been smoking about 1.00 pack per day. She has never used smokeless tobacco. She reports that she does not drink alcohol or use drugs.  Allergies  Allergen Reactions  . Hydromorphone Hcl Itching  . Cephalexin Nausea And Vomiting  . Demerol  [Meperidine Hcl] Rash  . Lovastatin Other (See Comments)    Other reaction(s): Unknown (See Comments) Possible myalgia Unknown   . Metformin And Related Nausea And Vomiting  . Sulfamethoxazole-Trimethoprim     Other reaction(s): Unknown DILI, pancreatitis  . Crestor [Rosuvastatin Calcium]     myalgia  . Niacin Other (See Comments)    Unknown   . Paroxetine Other (See Comments)  Other reaction(s): Unknown (See Comments) unknown Unknown     Family History  Problem Relation Age of Onset  . Diabetes Mother   . Hypertension Mother   . Cancer Father 64       ? bone cancer  . [name] Brother      Prior to Admission medications   Medication Sig Start Date End Date Taking? Authorizing Provider  aspirin (BAYER ASPIRIN) 325 MG tablet Take 1 tablet (325 mg total) by mouth daily. 06/16/15   Plotnikov, [name], MD  B-D INS SYR ULTRAFINE 1CC/31G 31G X 5/16" 1 ML MISC USE AS DIRECTED 09/08/16   Renato Shin, MD  baclofen (LIORESAL) 10 MG tablet  Take 2 tablets (20 mg total) by mouth 2 (two) times daily. 03/28/19   Plotnikov, [name], MD  Blood Glucose Monitoring Suppl (ONETOUCH VERIO) w/Device KIT  11/28/18   [provider]  cholecalciferol (VITAMIN D) 25 MCG (1000 UT) tablet Take 2 tablets (2,000 Units total) by mouth daily. 12/22/18   Plotnikov, [name], MD  ciprofloxacin (CIPRO) 500 MG tablet Take 1 tablet (500 mg total) by mouth 2 (two) times daily. 06/28/19   Evelina Bucy, DPM  clindamycin (CLEOCIN) 300 MG capsule Take 1 capsule (300 mg total) by mouth 2 (two) times daily. 06/28/19   Evelina Bucy, DPM  clonazePAM (KLONOPIN) 0.5 MG tablet Take 1 tablet (0.5 mg total) by mouth 2 (two) times daily as needed for anxiety. 07/01/19   Plotnikov, [name], MD  docusate sodium (COLACE) 100 MG capsule Take 200 mg by mouth every evening.    [provider]  doxycycline (VIBRAMYCIN) 100 MG capsule Take 1 capsule (100 mg total) by mouth 2 (two) times daily. 06/05/19   Evelina Bucy, DPM  ezetimibe (ZETIA) 10 MG tablet Take 1 tablet (10 mg total) by mouth daily. 03/28/19   Plotnikov, [name], MD  gabapentin (NEURONTIN) 800 MG tablet Take 1 tablet (800 mg total) by mouth 4 (four) times daily. 03/28/19   Plotnikov, [name], MD  glucose blood (ONETOUCH VERIO) test strip Use to check blood sugar 2 times per day. 06/24/16   Renato Shin, MD  insulin NPH-regular Human (70-30) 100 UNIT/ML injection Inject 70 Units into the skin 2 (two) times daily with a meal.    [provider]  Insulin Syringe-Needle U-100 (SURE COMFORT INSULIN SYRINGE) 31G X 5/16" 0.5 ML MISC USE AS DIRECTED WITH INSULIN 4 TIMES A DAY 09/08/17   Renato Shin, MD  lamoTRIgine (LAMICTAL) 25 MG tablet TAKE 1 TABLET BY MOUTH TWO  TIMES DAILY 10/03/18   Plotnikov, [name], MD  Lancet Devices (ACCU-CHEK [facility]) lancets Test two times daily 07/01/16   Renato Shin, MD  levothyroxine (SYNTHROID, LEVOTHROID) 75 MCG tablet TAKE 1 TABLET BY MOUTH  DAILY  10/03/18   Plotnikov, [name], MD  lisinopril (ZESTRIL) 10 MG tablet Take 1 tablet (10 mg total) by mouth daily. 03/28/19   Plotnikov, [name], MD  loratadine (CLARITIN) 10 MG tablet Take 10 mg by mouth daily as needed for allergies.    [provider]  [facility] DELICA LANCETS 50T [place] TEST TWO TIMES DAILY 04/18/18   Renato Shin, MD  oxyCODONE (ROXICODONE) 15 MG immediate release tablet Take 1 tablet (15 mg total) by mouth every 6 (six) hours as needed for pain. 07/01/19   Plotnikov, [name], MD  oxyCODONE (ROXICODONE) 15 MG immediate release tablet Take 1 tablet (15 mg total) by mouth every 6 (six) hours as  needed. 07/01/19   Plotnikov, [name], MD  rOPINIRole (REQUIP) 0.5 MG tablet Take 1 tablet (0.5 mg total) by mouth at bedtime. 03/28/19   Plotnikov, [name], MD  vitamin B-12 (CYANOCOBALAMIN) 1000 MCG tablet Take 1,000 mcg by mouth daily.      [provider]    Physical Exam: Vitals:   07/04/19 2232 07/05/19 0043  BP: 133/65 (!) 155/81  Pulse: 93 94  Resp: 16 (!) 24  Temp: 98.8 F (37.1 C) 98.1 F (36.7 C)  TempSrc: Oral   SpO2: 95% 98%      Constitutional: NAD, calm, comfortable Vitals:   07/04/19 2232 07/05/19 0043  BP: 133/65 (!) 155/81  Pulse: 93 94  Resp: 16 (!) 24  Temp: 98.8 F (37.1 C) 98.1 F (36.7 C)  TempSrc: Oral   SpO2: 95% 98%   Eyes: PERRL, lids and conjunctivae normal ENMT: Mucous membranes are moist. Posterior pharynx clear of any exudate or lesions.Normal dentition.  Neck: normal, supple, no masses, no thyromegaly Respiratory: clear to auscultation bilaterally, no wheezing, no crackles. Normal respiratory effort. No accessory muscle use.  Cardiovascular: Regular rate and rhythm, no murmurs / rubs / gallops. No extremity edema. 2+ pedal pulses. No carotid bruits.  Abdomen: no tenderness, no masses palpated. No hepatosplenomegaly. Bowel sounds positive.  Musculoskeletal: no clubbing / cyanosis.  Right lower leg swollen, red  tender, ulcer good ROM, no contractures. Normal muscle tone.  Skin: no rashes, lesions, ulcers. No induration Neurologic: CN 2-12 grossly intact. Sensation intact, DTR normal. Strength 5/5 in all 4.  Psychiatric: Normal judgment and insight. Alert and oriented x 3. Normal mood.     Labs on Admission: I have personally reviewed following labs and imaging studies  CBC: Recent Labs  Lab 07/04/19 2338  WBC 12.6*  HGB 14.7  HCT 44.8  MCV 87.3  PLT 665   Basic Metabolic Panel: Recent Labs  Lab 07/04/19 2338  CREATININE 1.31*   GFR: Estimated Creatinine Clearance: 63.7 mL/min (A) (by C-G formula based on SCr of 1.31 mg/dL (H)). Liver Function Tests: No results for input(s): AST, ALT, ALKPHOS, BILITOT, PROT, ALBUMIN in the last 168 hours. No results for input(s): LIPASE, AMYLASE in the last 168 hours. No results for input(s): AMMONIA in the last 168 hours. Coagulation Profile: No results for input(s): INR, PROTIME in the last 168 hours. Cardiac Enzymes: No results for input(s): CKTOTAL, CKMB, CKMBINDEX, TROPONINI in the last 168 hours. BNP (last 3 results) No results for input(s): PROBNP in the last 8760 hours. HbA1C: Recent Labs    07/04/19 2338  HGBA1C 8.1*   CBG: Recent Labs  Lab 07/04/19 2342  GLUCAP 315*   Lipid Profile: No results for input(s): CHOL, HDL, LDLCALC, TRIG, CHOLHDL, LDLDIRECT in the last 72 hours. Thyroid Function Tests: No results for input(s): TSH, T4TOTAL, FREET4, T3FREE, THYROIDAB in the last 72 hours. Anemia Panel: No results for input(s): VITAMINB12, FOLATE, FERRITIN, TIBC, IRON, RETICCTPCT in the last 72 hours. Urine analysis:    Component Value Date/Time   COLORURINE YELLOW 02/20/2019 1330   APPEARANCEUR CLEAR 02/20/2019 1330   LABSPEC 1.010 02/20/2019 1330   PHURINE 5.5 02/20/2019 1330   GLUCOSEU NEGATIVE 02/20/2019 1330   HGBUR NEGATIVE 02/20/2019 1330   BILIRUBINUR NEGATIVE 02/20/2019 1330   KETONESUR NEGATIVE 02/20/2019 1330    PROTEINUR NEGATIVE 09/28/2018 2318   UROBILINOGEN 0.2 02/20/2019 1330   NITRITE NEGATIVE 02/20/2019 1330   LEUKOCYTESUR NEGATIVE 02/20/2019 1330   Sepsis Labs: _0 (procalcitonin:4,lacticidven:4) ) Recent Results (from the  past 240 hour(s))  MRSA PCR Screening     Status: None   Collection Time: 07/05/19 12:46 AM   Specimen: Nasopharyngeal  Result Value Ref Range Status   MRSA by PCR NEGATIVE NEGATIVE Final    Comment:        The GeneXpert MRSA Assay (FDA approved for NASAL specimens only), is one component of a comprehensive MRSA colonization surveillance program. It is not intended to diagnose MRSA infection nor to guide or monitor treatment for MRSA infections. Performed at [facility], [place] [address], [place],  [place] [address]      Radiological Exams on Admission: No results found.  Assessment/Plan Principal Problem:   Diabetic foot ulcer ([place]) Active Problems:   Hypothyroidism   Dyslipidemia   GOUT   Anxiety state   Diabetic neuropathy ([place])   Right foot ulcer ([place])     #1 diabetic ulcer of the right foot: Infected with cellulitis in the area.  Patient will be admitted and initiated on IV vancomycin.  MRI will be done as well as ultrasound to rule out DVT.  Podiatry to follow.  Wound care consult.  #2 diabetes: Sliding scale insulin with supportive care  #3 hypothyroidism: Continue with levothyroxine.  #4 dyslipidemia: Resume home statin  #5 anxiety state: Continue home regimen.  #6 hypertension: Resume home regimen and continue  #7 tobacco abuse: Tobacco cessation counseling.   DVT prophylaxis: Lovenox Code Status: Full code Family Communication: Discussed care with the patient Disposition Plan: Home Consults called: Podiatry Admission status: Inpatient  Severity of Illness: The appropriate patient status for this patient is INPATIENT. Inpatient status is judged to be reasonable and necessary in order to provide the required  intensity of service to ensure the patient's safety. The patient's presenting symptoms, physical exam findings, and initial radiographic and laboratory data in the context of their chronic comorbidities is felt to place them at high risk for further clinical deterioration. Furthermore, it is not anticipated that the patient will be medically stable for discharge from the hospital within 2 midnights of admission. The following factors support the patient status of inpatient.   " The patient's presenting symptoms include right foot swelling and tenderness. " The worrisome physical exam findings include swollen red right lower extremity. " The initial radiographic and laboratory data are worrisome because of evidence of superficial infection. " The chronic co-morbidities include diabetes with neuropathy.   * I certify that at the point of admission it is my clinical judgment that the patient will require inpatient hospital care spanning beyond 2 midnights from the point of admission due to high intensity of service, high risk for further deterioration and high frequency of surveillance required.Barbette Merino MD [facility] Pager [phone]  If 7PM-7AM, please contact night-coverage [email or web address] Password [email or web address]  07/05/2019, 2:42 AM

## 2019-07-05 ENCOUNTER — Inpatient Hospital Stay (HOSPITAL_COMMUNITY): Payer: Medicare Other

## 2019-07-05 ENCOUNTER — Encounter (HOSPITAL_COMMUNITY): Payer: Self-pay | Admitting: *Deleted

## 2019-07-05 ENCOUNTER — Encounter (HOSPITAL_COMMUNITY)
Admission: AD | Disposition: A | Discharge status: Home Health | Payer: Self-pay | Source: Ambulatory Visit | Attending: Internal Medicine

## 2019-07-05 ENCOUNTER — Encounter (HOSPITAL_COMMUNITY): Payer: Medicare Other

## 2019-07-05 ENCOUNTER — Inpatient Hospital Stay (HOSPITAL_COMMUNITY): Payer: Medicare Other | Admitting: Anesthesiology

## 2019-07-05 DIAGNOSIS — M7671 Peroneal tendinitis, right leg: Secondary | ICD-10-CM

## 2019-07-05 DIAGNOSIS — E0842 Diabetes mellitus due to underlying condition with diabetic polyneuropathy: Secondary | ICD-10-CM

## 2019-07-05 DIAGNOSIS — L97514 Non-pressure chronic ulcer of other part of right foot with necrosis of bone: Secondary | ICD-10-CM

## 2019-07-05 DIAGNOSIS — E08621 Diabetes mellitus due to underlying condition with foot ulcer: Secondary | ICD-10-CM

## 2019-07-05 HISTORY — PX: METATARSAL OSTEOTOMY: SHX1641

## 2019-07-05 HISTORY — PX: APPLICATION OF WOUND VAC: SHX5189

## 2019-07-05 LAB — CBC
HCT: 39.1 % (ref 36.0–46.0)
Hemoglobin: 12.6 g/dL (ref 12.0–15.0)
MCH: 28.3 pg (ref 26.0–34.0)
MCHC: 32.2 g/dL (ref 30.0–36.0)
MCV: 87.9 fL (ref 80.0–100.0)
Platelets: 298 10*3/uL (ref 150–400)
RBC: 4.45 MIL/uL (ref 3.87–5.11)
RDW: 13.8 % (ref 11.5–15.5)
WBC: 11.9 10*3/uL — ABNORMAL HIGH (ref 4.0–10.5)
nRBC: 0 % (ref 0.0–0.2)

## 2019-07-05 LAB — GLUCOSE, CAPILLARY
Glucose-Capillary: 190 mg/dL — ABNORMAL HIGH (ref 70–99)
Glucose-Capillary: 231 mg/dL — ABNORMAL HIGH (ref 70–99)
Glucose-Capillary: 175 mg/dL — ABNORMAL HIGH (ref 70–99)
Glucose-Capillary: 168 mg/dL — ABNORMAL HIGH (ref 70–99)
Glucose-Capillary: 264 mg/dL — ABNORMAL HIGH (ref 70–99)

## 2019-07-05 LAB — HEMOGLOBIN A1C
Hgb A1c MFr Bld: 8.1 % — ABNORMAL HIGH (ref 4.8–5.6)
Mean Plasma Glucose: 185.77 mg/dL

## 2019-07-05 LAB — CREATININE, SERUM
Creatinine, Ser: 1.31 mg/dL — ABNORMAL HIGH (ref 0.44–1.00)
GFR calc Af Amer: 54 mL/min — ABNORMAL LOW (ref >60)
GFR calc non Af Amer: 46 mL/min — ABNORMAL LOW (ref >60)

## 2019-07-05 LAB — COMPREHENSIVE METABOLIC PANEL
ALT: 13 U/L (ref 0–44)
AST: 16 U/L (ref 15–41)
Albumin: 2.8 g/dL — ABNORMAL LOW (ref 3.5–5.0)
Alkaline Phosphatase: 94 U/L (ref 38–126)
Anion gap: 8 (ref 5–15)
BUN: 24 mg/dL — ABNORMAL HIGH (ref 6–20)
CO2: 24 mmol/L (ref 22–32)
Calcium: 8.1 mg/dL — ABNORMAL LOW (ref 8.9–10.3)
Chloride: 104 mmol/L (ref 98–111)
Creatinine, Ser: 1.26 mg/dL — ABNORMAL HIGH (ref 0.44–1.00)
GFR calc Af Amer: 56 mL/min — ABNORMAL LOW (ref >60)
GFR calc non Af Amer: 49 mL/min — ABNORMAL LOW (ref >60)
Glucose, Bld: 241 mg/dL — ABNORMAL HIGH (ref 70–99)
Potassium: 4.2 mmol/L (ref 3.5–5.1)
Sodium: 136 mmol/L (ref 135–145)
Total Bilirubin: 0.4 mg/dL (ref 0.3–1.2)
Total Protein: 6.4 g/dL — ABNORMAL LOW (ref 6.5–8.1)

## 2019-07-05 LAB — SARS CORONAVIRUS 2 (TAT 6-24 HRS): SARS Coronavirus 2: NEGATIVE

## 2019-07-05 LAB — MRSA PCR SCREENING: MRSA by PCR: NEGATIVE

## 2019-07-05 LAB — SEDIMENTATION RATE: Sed Rate: 48 mm/hr — ABNORMAL HIGH (ref 0–22)

## 2019-07-05 LAB — C-REACTIVE PROTEIN: CRP: 1 mg/dL — ABNORMAL HIGH (ref <1.0)

## 2019-07-05 SURGERY — OSTEOTOMY, METATARSAL BONE
Anesthesia: General | Site: Toe | Laterality: Right

## 2019-07-05 MED ORDER — DEXAMETHASONE SODIUM PHOSPHATE 10 MG/ML IJ SOLN
INTRAMUSCULAR | Status: DC | PRN
  Administered 2019-07-05: 5 mg via INTRAVENOUS

## 2019-07-05 MED ORDER — CLONAZEPAM 0.5 MG PO TABS: 0.5000 mg | ORAL_TABLET | Freq: Two times a day (BID) | ORAL | Status: DC | PRN

## 2019-07-05 MED ORDER — SODIUM CHLORIDE 0.9% FLUSH
10.0000 mL | INTRAVENOUS | Status: DC | PRN
  Administered 2019-07-10: 10 mL
  Filled 2019-07-05: qty 40

## 2019-07-05 MED ORDER — FENTANYL CITRATE (PF) 250 MCG/5ML IJ SOLN
INTRAMUSCULAR | Status: AC
  Filled 2019-07-05: qty 5

## 2019-07-05 MED ORDER — ONDANSETRON HCL 4 MG/2ML IJ SOLN
INTRAMUSCULAR | Status: AC
  Filled 2019-07-05: qty 2

## 2019-07-05 MED ORDER — SUCCINYLCHOLINE CHLORIDE 200 MG/10ML IV SOSY
PREFILLED_SYRINGE | INTRAVENOUS | Status: AC
  Filled 2019-07-05: qty 10

## 2019-07-05 MED ORDER — ACETAMINOPHEN 10 MG/ML IV SOLN
1000.0000 mg | Freq: Once | INTRAVENOUS | Status: AC
  Administered 2019-07-05: 1000 mg via INTRAVENOUS

## 2019-07-05 MED ORDER — EPHEDRINE SULFATE 50 MG/ML IJ SOLN
INTRAMUSCULAR | Status: DC | PRN
  Administered 2019-07-05: 10 mg via INTRAVENOUS

## 2019-07-05 MED ORDER — BUPIVACAINE HCL (PF) 0.5 % IJ SOLN
INTRAMUSCULAR | Status: AC
  Filled 2019-07-05: qty 30

## 2019-07-05 MED ORDER — VANCOMYCIN HCL 10 G IV SOLR
1250.0000 mg | INTRAVENOUS | Status: DC
  Administered 2019-07-05 – 2019-07-07 (×3): 1250 mg via INTRAVENOUS
  Filled 2019-07-05 (×3): qty 1250

## 2019-07-05 MED ORDER — VANCOMYCIN HCL 1 G IV SOLR
INTRAVENOUS | Status: DC | PRN
  Administered 2019-07-05: 1000 mg via TOPICAL

## 2019-07-05 MED ORDER — HYDROGEN PEROXIDE 3 % EX SOLN
CUTANEOUS | Status: DC | PRN
  Administered 2019-07-05: 1

## 2019-07-05 MED ORDER — EPHEDRINE 5 MG/ML INJ
INTRAVENOUS | Status: AC
  Filled 2019-07-05: qty 10

## 2019-07-05 MED ORDER — PROMETHAZINE HCL 25 MG/ML IJ SOLN: 6.2500 mg | INTRAMUSCULAR | Status: DC | PRN

## 2019-07-05 MED ORDER — LIDOCAINE 2% (20 MG/ML) 5 ML SYRINGE
INTRAMUSCULAR | Status: AC
  Filled 2019-07-05: qty 5

## 2019-07-05 MED ORDER — SODIUM CHLORIDE 0.9 % IV SOLN
2.0000 g | INTRAVENOUS | Status: DC
  Administered 2019-07-06 – 2019-07-10 (×5): 2 g via INTRAVENOUS
  Filled 2019-07-05 (×5): qty 20

## 2019-07-05 MED ORDER — BACLOFEN 20 MG PO TABS
20.0000 mg | ORAL_TABLET | Freq: Two times a day (BID) | ORAL | Status: DC
  Administered 2019-07-05 – 2019-07-10 (×11): 20 mg via ORAL
  Filled 2019-07-05 (×11): qty 1

## 2019-07-05 MED ORDER — MIDAZOLAM HCL 5 MG/5ML IJ SOLN
INTRAMUSCULAR | Status: DC | PRN
  Administered 2019-07-05: 2 mg via INTRAVENOUS

## 2019-07-05 MED ORDER — BUPIVACAINE HCL 0.5 % IJ SOLN
INTRAMUSCULAR | Status: DC | PRN
  Administered 2019-07-05: 10 mL

## 2019-07-05 MED ORDER — CLINDAMYCIN PHOSPHATE 900 MG/50ML IV SOLN
900.0000 mg | INTRAVENOUS | Status: AC
  Administered 2019-07-05: 900 mg via INTRAVENOUS

## 2019-07-05 MED ORDER — SODIUM CHLORIDE 0.9 % IV SOLN
1.0000 g | INTRAVENOUS | Status: DC
  Administered 2019-07-05: 1 g via INTRAVENOUS
  Filled 2019-07-05: qty 10

## 2019-07-05 MED ORDER — MIDAZOLAM HCL 2 MG/2ML IJ SOLN
INTRAMUSCULAR | Status: AC
  Filled 2019-07-05: qty 2

## 2019-07-05 MED ORDER — LACTATED RINGERS IV SOLN
INTRAVENOUS | Status: DC
  Administered 2019-07-05: 13:00:00 via INTRAVENOUS

## 2019-07-05 MED ORDER — VANCOMYCIN HCL 1000 MG IV SOLR
INTRAVENOUS | Status: AC
  Filled 2019-07-05: qty 1000

## 2019-07-05 MED ORDER — ASPIRIN 325 MG PO TABS
325.0000 mg | ORAL_TABLET | Freq: Every day | ORAL | Status: DC
  Administered 2019-07-05 – 2019-07-10 (×6): 325 mg via ORAL
  Filled 2019-07-05 (×6): qty 1

## 2019-07-05 MED ORDER — KETOROLAC TROMETHAMINE 30 MG/ML IJ SOLN
INTRAMUSCULAR | Status: AC
  Filled 2019-07-05: qty 1

## 2019-07-05 MED ORDER — LAMOTRIGINE 25 MG PO TABS
25.0000 mg | ORAL_TABLET | Freq: Two times a day (BID) | ORAL | Status: DC
  Administered 2019-07-05 – 2019-07-10 (×11): 25 mg via ORAL
  Filled 2019-07-05 (×11): qty 1

## 2019-07-05 MED ORDER — ROCURONIUM BROMIDE 10 MG/ML (PF) SYRINGE
PREFILLED_SYRINGE | INTRAVENOUS | Status: AC
  Filled 2019-07-05: qty 10

## 2019-07-05 MED ORDER — LEVOTHYROXINE SODIUM 75 MCG PO TABS
75.0000 ug | ORAL_TABLET | Freq: Every day | ORAL | Status: DC
  Administered 2019-07-05 – 2019-07-10 (×6): 75 ug via ORAL
  Filled 2019-07-05 (×6): qty 1

## 2019-07-05 MED ORDER — ONDANSETRON HCL 4 MG/2ML IJ SOLN
INTRAMUSCULAR | Status: DC | PRN
  Administered 2019-07-05: 4 mg via INTRAVENOUS

## 2019-07-05 MED ORDER — KETOROLAC TROMETHAMINE 30 MG/ML IJ SOLN
30.0000 mg | Freq: Once | INTRAMUSCULAR | Status: AC
  Administered 2019-07-05: 30 mg via INTRAVENOUS

## 2019-07-05 MED ORDER — PROPOFOL 10 MG/ML IV BOLUS
INTRAVENOUS | Status: AC
  Filled 2019-07-05: qty 20

## 2019-07-05 MED ORDER — DEXAMETHASONE SODIUM PHOSPHATE 10 MG/ML IJ SOLN
INTRAMUSCULAR | Status: AC
  Filled 2019-07-05: qty 2

## 2019-07-05 MED ORDER — ACETAMINOPHEN 10 MG/ML IV SOLN
INTRAVENOUS | Status: AC
  Administered 2019-07-05: 16:00:00
  Filled 2019-07-05: qty 100

## 2019-07-05 MED ORDER — LIDOCAINE 2% (20 MG/ML) 5 ML SYRINGE
INTRAMUSCULAR | Status: DC | PRN
  Administered 2019-07-05: 60 mg via INTRAVENOUS

## 2019-07-05 MED ORDER — GABAPENTIN 400 MG PO CAPS
800.0000 mg | ORAL_CAPSULE | Freq: Four times a day (QID) | ORAL | Status: DC
  Administered 2019-07-05 – 2019-07-10 (×21): 800 mg via ORAL
  Filled 2019-07-05 (×21): qty 2

## 2019-07-05 MED ORDER — NICOTINE 21 MG/24HR TD PT24
21.0000 mg | MEDICATED_PATCH | Freq: Every day | TRANSDERMAL | Status: DC
  Filled 2019-07-05 (×6): qty 1

## 2019-07-05 MED ORDER — FENTANYL CITRATE (PF) 100 MCG/2ML IJ SOLN
INTRAMUSCULAR | Status: AC
  Filled 2019-07-05: qty 2

## 2019-07-05 MED ORDER — ROPINIROLE HCL 1 MG PO TABS
0.5000 mg | ORAL_TABLET | Freq: Every day | ORAL | Status: DC
  Administered 2019-07-05 – 2019-07-09 (×5): 0.5 mg via ORAL
  Filled 2019-07-05 (×5): qty 1

## 2019-07-05 MED ORDER — PHENYLEPHRINE HCL (PRESSORS) 10 MG/ML IV SOLN
INTRAVENOUS | Status: DC | PRN
  Administered 2019-07-05: 80 ug via INTRAVENOUS

## 2019-07-05 MED ORDER — PROPOFOL 10 MG/ML IV BOLUS
INTRAVENOUS | Status: DC | PRN
  Administered 2019-07-05: 200 mg via INTRAVENOUS

## 2019-07-05 MED ORDER — SODIUM CHLORIDE 0.9% FLUSH
10.0000 mL | Freq: Two times a day (BID) | INTRAVENOUS | Status: DC
  Administered 2019-07-05 – 2019-07-09 (×3): 10 mL

## 2019-07-05 MED ORDER — 0.9 % SODIUM CHLORIDE (POUR BTL) OPTIME
TOPICAL | Status: DC | PRN
  Administered 2019-07-05: 1000 mL

## 2019-07-05 MED ORDER — OXYCODONE-ACETAMINOPHEN 5-325 MG PO TABS
2.0000 | ORAL_TABLET | Freq: Once | ORAL | Status: AC
  Administered 2019-07-05: 2 via ORAL
  Filled 2019-07-05: qty 2

## 2019-07-05 MED ORDER — INSULIN DETEMIR 100 UNIT/ML ~~LOC~~ SOLN
10.0000 [IU] | Freq: Two times a day (BID) | SUBCUTANEOUS | Status: DC
  Administered 2019-07-05 – 2019-07-06 (×2): 10 [IU] via SUBCUTANEOUS
  Filled 2019-07-05 (×3): qty 0.1

## 2019-07-05 MED ORDER — OXYCODONE HCL 5 MG PO TABS
15.0000 mg | ORAL_TABLET | Freq: Four times a day (QID) | ORAL | Status: DC | PRN
  Administered 2019-07-05 – 2019-07-07 (×8): 15 mg via ORAL
  Filled 2019-07-05 (×9): qty 3

## 2019-07-05 MED ORDER — PHENYLEPHRINE 40 MCG/ML (10ML) SYRINGE FOR IV PUSH (FOR BLOOD PRESSURE SUPPORT)
PREFILLED_SYRINGE | INTRAVENOUS | Status: AC
  Filled 2019-07-05: qty 10

## 2019-07-05 MED ORDER — FENTANYL CITRATE (PF) 100 MCG/2ML IJ SOLN
25.0000 ug | INTRAMUSCULAR | Status: DC | PRN
  Administered 2019-07-05 (×3): 50 ug via INTRAVENOUS

## 2019-07-05 MED ORDER — CLINDAMYCIN PHOSPHATE 900 MG/50ML IV SOLN
INTRAVENOUS | Status: AC
  Filled 2019-07-05: qty 50

## 2019-07-05 SURGICAL SUPPLY — 44 items
BLADE AVERAGE 25X9 (BLADE) ×1 IMPLANT
BLADE LONG MED 31X9 (MISCELLANEOUS) ×2 IMPLANT
BNDG ELASTIC 4X5.8 VLCR STR LF (GAUZE/BANDAGES/DRESSINGS) ×3 IMPLANT
BNDG GAUZE ELAST 4 BULKY (GAUZE/BANDAGES/DRESSINGS) ×3 IMPLANT
CONT SPEC 4OZ CLIKSEAL STRL BL (MISCELLANEOUS) ×4 IMPLANT
COVER SURGICAL LIGHT HANDLE (MISCELLANEOUS) ×3 IMPLANT
COVER WAND RF STERILE (DRAPES) ×2 IMPLANT
DRESSING PEEL AND PLAC PRVNA20 (GAUZE/BANDAGES/DRESSINGS) IMPLANT
DRSG EMULSION OIL 3X3 NADH (GAUZE/BANDAGES/DRESSINGS) ×2 IMPLANT
DRSG PEEL AND PLACE PREVENA 20 (GAUZE/BANDAGES/DRESSINGS) ×6
ELECT REM PT RETURN 9FT ADLT (ELECTROSURGICAL) ×3
ELECTRODE REM PT RTRN 9FT ADLT (ELECTROSURGICAL) ×2 IMPLANT
GAUZE SPONGE 4X4 12PLY STRL (GAUZE/BANDAGES/DRESSINGS) ×3 IMPLANT
GLOVE BIO SURGEON STRL SZ7.5 (GLOVE) ×4 IMPLANT
GLOVE BIOGEL PI IND STRL 8 (GLOVE) ×2 IMPLANT
GLOVE BIOGEL PI INDICATOR 8 (GLOVE) ×2
GOWN STRL REUS W/ TWL LRG LVL3 (GOWN DISPOSABLE) ×2 IMPLANT
GOWN STRL REUS W/ TWL XL LVL3 (GOWN DISPOSABLE) ×2 IMPLANT
GOWN STRL REUS W/TWL LRG LVL3 (GOWN DISPOSABLE) ×3
GOWN STRL REUS W/TWL XL LVL3 (GOWN DISPOSABLE) ×3
HYDROGEN PEROXIDE 16OZ (MISCELLANEOUS) ×1 IMPLANT
KIT BASIN OR (CUSTOM PROCEDURE TRAY) ×3 IMPLANT
KIT DRSG PREVENA PLUS 7DAY 125 (MISCELLANEOUS) ×1 IMPLANT
KIT TURNOVER KIT B (KITS) ×3 IMPLANT
NDL HYPO 25GX1X1/2 BEV (NEEDLE) ×2 IMPLANT
NEEDLE HYPO 25GX1X1/2 BEV (NEEDLE) ×3 IMPLANT
NS IRRIG 1000ML POUR BTL (IV SOLUTION) ×3 IMPLANT
PACK ORTHO EXTREMITY (CUSTOM PROCEDURE TRAY) ×3 IMPLANT
PAD ARMBOARD 7.5X6 YLW CONV (MISCELLANEOUS) ×3 IMPLANT
PAD CAST 4YDX4 CTTN HI CHSV (CAST SUPPLIES) IMPLANT
PADDING CAST COTTON 4X4 STRL (CAST SUPPLIES) ×3
SOL PREP POV-IOD 4OZ 10% (MISCELLANEOUS) ×3 IMPLANT
STAPLER VISISTAT 35W (STAPLE) ×1 IMPLANT
SUT ETHILON 3 0 PS 1 (SUTURE) ×5 IMPLANT
SUT MERSILENE 4 0 P 3 (SUTURE) ×1 IMPLANT
SUT VIC AB 3-0 PS2 18 (SUTURE) ×3
SUT VIC AB 3-0 PS2 18XBRD (SUTURE) IMPLANT
SUT VIC AB 4-0 PS2 27 (SUTURE) ×1 IMPLANT
SWAB CULTURE LIQUID MINI MALE (MISCELLANEOUS) ×1 IMPLANT
SYR CONTROL 10ML LL (SYRINGE) ×3 IMPLANT
TIP IRRIG INTERPULSE NO COAXIL (MISCELLANEOUS) ×1 IMPLANT
TOWEL GREEN STERILE (TOWEL DISPOSABLE) ×3 IMPLANT
TUBE CONNECTING 12X1/4 (SUCTIONS) ×3 IMPLANT
YANKAUER SUCT BULB TIP NO VENT (SUCTIONS) ×3 IMPLANT

## 2019-07-05 NOTE — Progress Notes (Signed)
Patient is a direct admit from home.  Patient is admitted for cellulitis.  Patient is alert and oriented, able to answer questions. Right Leg noted to be red and swollen. With wound to the sole of right foot.  Patient was oriented to the room and equipment, placed on progressive monitor. Lungs clear to auscultation. No sign of distress noted. Will continue to monitor.

## 2019-07-05 NOTE — Transfer of Care (Signed)
Immediate Anesthesia Transfer of Care Note  Patient: Katherine Herrera  Procedure(s) Performed: RIGHT FOOT 5TH METATARSAL RESECTION; PERONEAL TENDONESIS, EXCISION OF ULCER, LOCAL TISSUE TRANSFER  (Right Toe) Application Of Wound Vac (Right Foot)  Patient Location: PACU  Anesthesia Type:General  Level of Consciousness: awake, alert  and oriented  Airway & Oxygen Therapy: Patient Spontanous Breathing and Patient connected to nasal cannula oxygen  Post-op Assessment: Report given to RN and Post -op Vital signs reviewed and stable  Post vital signs: Reviewed and stable  Last Vitals:  Vitals Value Taken Time  BP 156/95 07/05/19 1443  Temp    Pulse 81 07/05/19 1443  Resp 14 07/05/19 1443  SpO2 98 % 07/05/19 1443  Vitals shown include unvalidated device data.  Last Pain:  Vitals:   07/05/19 1210  TempSrc: Oral  PainSc:       Patients Stated Pain Goal: 0 ([id number])  Complications: No apparent anesthesia complications

## 2019-07-05 NOTE — Progress Notes (Signed)
Pharmacy Antibiotic Note  Katherine Herrera is a 53 y.o. female admitted on 07/04/2019 with right foot ulcer and cellulitis, concern for osteomyelitis.Marland Kitchen  Pharmacy has been consulted for Vancomycin dosing.     Vancomycin 2gm IV given ~12am today. Also started on Ceftriaxone 1gm IV q24hrs.   S/p OR this afternoon for 5th metatarsal resection/excision of ulcer, wound VAC. Surgical cultures sent. Clindamycin 900 mg IV given pre-op.  Vancomycin powder packed into wound.    Plan:  Vancomycin 1250 mg IV Q 24 hrs to begin tonight.  Goal AUC 400-550.  Expected AUC: 489  SCr used: 1.26  Increase Ceftriaxone to 2 gm IV q24hrs.  Follow renal function, culture data, clinical progress and antibiotic plans.  Vanc levels at steady state.    Height: 5\' 9"  (175.3 cm) Weight: 228 lb 13.4 oz (103.8 kg) IBW/kg (Calculated) : 66.2  Temp (24hrs), Avg:98 F (36.7 C), Min:97.4 F (36.3 C), Max:98.8 F (37.1 C)  Recent Labs  Lab 07/04/19 2338 07/05/19 0243  WBC 12.6* 11.9*  CREATININE 1.31* 1.26*    Estimated Creatinine Clearance: 66.2 mL/min (A) (by C-G formula based on SCr of 1.26 mg/dL (H)).    Allergies  Allergen Reactions  . Hydromorphone Hcl Itching  . Cephalexin Nausea And Vomiting  . Demerol  [Meperidine Hcl] Rash  . Lovastatin Other (See Comments)    Other reaction(s): Unknown (See Comments) Possible myalgia Unknown   . Metformin And Related Nausea And Vomiting  . Sulfamethoxazole-Trimethoprim     Other reaction(s): Unknown DILI, pancreatitis  . Crestor [Rosuvastatin Calcium]     myalgia  . Niacin Other (See Comments)    Unknown   . Paroxetine Other (See Comments)    Other reaction(s): Unknown (See Comments) unknown Unknown     Antimicrobials this admission:  Vancomycin 9/4>>  Ceftriaxone 9/4>>  Clindamycin x 1 pre-op on 9/4  Dose adjustments this admission:  9/4: increase Ceftriaxone from 1 to 2gm IV q24hrs  Microbiology results:   9/4 right foot tissue x 2:  9/4  COVID: negative  9/4 MRSA PCR: negative    Thank you for allowing pharmacy to be a part of this patient's care.  Arty Baumgartner, [place] Pager: [phone] or phone: [phone] 07/05/2019 4:43 PM

## 2019-07-05 NOTE — Progress Notes (Signed)
Inpatient Diabetes Program Recommendations  AACE/ADA: New Consensus Statement on Inpatient Glycemic Control (2015)  Target Ranges:  Prepandial:   less than 140 mg/dL      Peak postprandial:   less than 180 mg/dL (1-2 hours)      Critically ill patients:  140 - 180 mg/dL   Lab Results  Component Value Date   GLUCAP 190 (H) 07/05/2019   HGBA1C 8.1 (H) 07/04/2019    Review of Glycemic Control Results for EMONII, CLEMETSON (MRN [id number]) as of 07/05/2019 11:59  Ref. Range 01/02/2019 07:48 01/02/2019 10:44 07/04/2019 23:42 07/05/2019 08:20  Glucose-Capillary Latest Ref Range: 70 - 99 mg/dL 226 (H) 284 (H) 315 (H) 190 (H)   Diabetes history: Type 2 DM Outpatient Diabetes medications: Novolog 70/30 70 units BID Current orders for Inpatient glycemic control: Novolog 0-20 units TID, Novolog 0-5 units QHS  Inpatient Diabetes Program Recommendations:    Consider adding portion of basal: Levemir 10 units BID.  Thanks, Bronson Curb, MSN, RNC-OB Diabetes Coordinator [phone] (8a-5p)

## 2019-07-05 NOTE — Progress Notes (Signed)
  Subjective:  Patient ID: AHMIA GERST, female    DOB: 09/22/1966,  MRN: [id number]  Patient seen at bedside. Pain improved. Hasn't eaten since last night. Objective:   Vitals:   07/05/19 0043 07/05/19 0535  BP: (!) 155/81 104/74  Pulse: 94 68  Resp: (!) 24 13  Temp: 98.1 F (36.7 C) 97.6 F (36.4 C)  SpO2: 98% 92%   General AA&O x3. Normal mood and affect.  Vascular Dorsalis pedis and posterior tibial pulses 2/4 bilat. Brisk capillary refill to all digits. Pedal hair present.  Neurologic Epicritic sensation grossly diminished.  Dermatologic Cellulitis improved to right foot. Edema reduced. Ulcer without active drainage. Still with local warmth and slight erythema Right leg edematous, pitting edema.  Orthopedic: Positive motor distally.    Assessment & Plan:  Patient was evaluated and treated and all questions answered.  Right Foot Ulceration with Cellulitis, Concern for OM -Cellulitis much improved today -MRI reviewed, marrow edema, cortical disruption. Findings concerning for OM. -Discussed with patient proceeding with 5th metatarsal excision, possible peroneal tenodesis. Will consider wound excision/ closure. Patient agrees to proceed. -Continue NPO -Proceed to OR today. -Will continue to follow.  Evelina Bucy, DPM  Accessible via secure chat for questions or concerns.

## 2019-07-05 NOTE — Anesthesia Procedure Notes (Addendum)
Procedure Name: LMA Insertion Date/Time: 07/05/2019 12:54 PM Performed by: Scheryl Darter, CRNA Pre-anesthesia Checklist: Patient identified, Emergency Drugs available, Suction available and Patient being monitored Patient Re-evaluated:Patient Re-evaluated prior to induction Oxygen Delivery Method: Circle System Utilized Preoxygenation: Pre-oxygenation with 100% oxygen Induction Type: IV induction Ventilation: Mask ventilation without difficulty LMA: LMA inserted LMA Size: 4.0 Number of attempts: 1 Airway Equipment and Method: Bite block Placement Confirmation: positive ETCO2 Tube secured with: Tape Dental Injury: Teeth and Oropharynx as per pre-operative assessment

## 2019-07-05 NOTE — Addendum Note (Signed)
Addendum  created 07/05/19 1335 by Moshe Salisbury, CRNA   Intraprocedure Flowsheets edited

## 2019-07-05 NOTE — Consult Note (Signed)
[place] Nurse wound consult note Patient receiving care in [facility] 5W30.  Reason for Consult: left foot wound/cellulitis Primary RN present at the time of my assessment.  The patient states she saw Dr. March Rummage, Podiatrist, yesterday, and he has been managing her right foot wound and care.  She further states he told her he would see her in the hospital.  Please follow wound care orders provided by Dr. March Rummage. Monitor the wound area(s) for worsening of condition such as: Signs/symptoms of infection,  Increase in size,  Development of or worsening of odor, Development of pain, or increased pain at the affected locations.  Notify the medical team if any of these develop.  Thank you for the consult.  Discussed plan of care with the patient and bedside nurse.  [place] nurse will not follow at this time.  Please re-consult the [place] team if needed.  Val Riles, RN, MSN, CWOCN, CNS-BC, pager [phone]

## 2019-07-05 NOTE — Anesthesia Preprocedure Evaluation (Signed)
Anesthesia Evaluation  Patient identified by MRN, date of birth, ID band Patient awake    Reviewed: Allergy & Precautions, NPO status , Patient's Chart, lab work & pertinent test results  Airway Mallampati: I  TM Distance: >3 FB Neck ROM: Full    Dental  (+) Teeth Intact, Dental Advisory Given   Pulmonary COPD, Current Smoker and Patient abstained from smoking.,    breath sounds clear to auscultation       Cardiovascular hypertension, Pt. on medications  Rhythm:Regular Rate:Normal     Neuro/Psych  Headaches, Anxiety    GI/Hepatic negative GI ROS, Neg liver ROS,   Endo/Other  diabetes, Type 2, Oral Hypoglycemic AgentsHypothyroidism   Renal/GU Renal disease     Musculoskeletal  (+) Arthritis ,   Abdominal Normal abdominal exam  (+)   Peds  Hematology   Anesthesia Other Findings - HLD  Reproductive/Obstetrics                             Lab Results  Component Value Date   WBC 11.9 (H) 07/05/2019   HGB 12.6 07/05/2019   HCT 39.1 07/05/2019   MCV 87.9 07/05/2019   PLT 298 07/05/2019   Lab Results  Component Value Date   CREATININE 1.26 (H) 07/05/2019   BUN 24 (H) 07/05/2019   NA 136 07/05/2019   K 4.2 07/05/2019   CL 104 07/05/2019   CO2 24 07/05/2019   Lab Results  Component Value Date   INR 0.95 01/28/2014   Echo: 1. The left ventricle has normal systolic function with an ejection fraction of 60-65%. The cavity size was normal. Left ventricular diastolic parameters were normal.  2. The right ventricle has normal systolic function. The cavity was normal. There is no increase in right ventricular wall thickness.  3. The mitral valve is normal in structure. There is mild mitral annular calcification present.  4. The tricuspid valve is normal in structure.  5. The aortic root and ascending aorta are normal in size and structure.  Anesthesia Physical  Anesthesia Plan  ASA:  III  Anesthesia Plan: General   Post-op Pain Management:    Induction: Intravenous  PONV Risk Score and Plan: 3 and Ondansetron, Dexamethasone and Midazolam  Airway Management Planned: LMA  Additional Equipment: None  Intra-op Plan:   Post-operative Plan: Extubation in OR  Informed Consent: I have reviewed the patients History and Physical, chart, labs and discussed the procedure including the risks, benefits and alternatives for the proposed anesthesia with the patient or authorized representative who has indicated his/her understanding and acceptance.     Dental advisory given  Plan Discussed with: CRNA  Anesthesia Plan Comments:         Anesthesia Quick Evaluation

## 2019-07-05 NOTE — Progress Notes (Signed)
Paged the Night coverage for MD to come and see patient. Awaiting response from MD.

## 2019-07-05 NOTE — Addendum Note (Signed)
Addendum  created 07/05/19 1339 by Moshe Salisbury, CRNA   Intraprocedure Flowsheets edited

## 2019-07-05 NOTE — Brief Op Note (Signed)
07/05/2019  2:36 PM  PATIENT:  Katherine Herrera  53 y.o. female  PRE-OPERATIVE DIAGNOSIS:  Right foot osteomyelitis ulcer  POST-OPERATIVE DIAGNOSIS:  Right foot osteomyelitis ulcer  PROCEDURE:  Procedure(s): RIGHT FOOT 5TH METATARSAL RESECTION; PERONEAL TENDONESIS, EXCISION OF ULCER, LOCAL TISSUE TRANSFER  (Right) Application Of Wound Vac (Right)  SURGEON:  Surgeon(s) and Role:    * Evelina Bucy, DPM - Primary   Boneta Lucks, DPM- assistant surgeon  PHYSICIAN ASSISTANT:   ASSISTANTS: None   ANESTHESIA:   general  EBL:  15 mL   BLOOD ADMINISTERED:none  DRAINS: Wound Vac (privena)   LOCAL MEDICATIONS USED:  BUPIVICAINE 10 cc   SPECIMEN:   ID Type Source Tests Collected by Time Destination  1 : Metatarsal bone right foot Tissue Foot, Right SURGICAL PATHOLOGY Evelina Bucy, DPM 07/05/2019 1330   2 : Soft Tissue mass, right foot Tissue Foot, Right SURGICAL PATHOLOGY Evelina Bucy, DPM 07/05/2019 1338   A : Metatarsal bone clean margin Tissue Foot, Right AEROBIC/ANAEROBIC CULTURE (SURGICAL/DEEP WOUND) Evelina Bucy, DPM 07/05/2019 1317   B : Right foot swab Tissue Foot, Right AEROBIC/ANAEROBIC CULTURE (SURGICAL/DEEP WOUND) Evelina Bucy, DPM 07/05/2019 1346    COUNTS:  YES  TOURNIQUET:   Total Tourniquet Time Documented: Calf (Right) - 76 minutes Total: Calf (Right) - 76 minutes   DICTATION: .Viviann Spare Dictation  PLAN OF CARE: Transfer to floors  PATIENT DISPOSITION:  PACU - hemodynamically stable.   Delay start of Pharmacological VTE agent (>24hrs) due to surgical blood loss or risk of bleeding:  No

## 2019-07-05 NOTE — Progress Notes (Signed)
Patient was c/o of 8/10 pain at the surgical site on her right foot. [place],NP notified. Awaiting for orders at this time.

## 2019-07-05 NOTE — Op Note (Signed)
Patient Name: Katherine Herrera DOB: 01-27-1966  MRN: [id number]   Date of Service: 07/05/2019  Surgeon: Dr. Hardie Pulley, DPM Assistants: Dr. Boneta Lucks (assistant surgeon) Pre-operative Diagnosis:  Diabetic ulcer right foot with necrosis of bone, osteomyelitis Post-operative Diagnosis:  same Procedures:  1) Right 5th metatarsal metatarsectomy  2) Right peroneal tenodesis / tendon transfer  3) Right foot excision of ulcer  4) Right foot adjacent tissue transfer  5) Right foot application of wound VAC  Pathology/Specimens: ID Type Source Tests Collected by Time Destination  1 : Metatarsal bone right foot Tissue Foot, Right SURGICAL PATHOLOGY Evelina Bucy, DPM 07/05/2019 1330   2 : Soft Tissue mass, right foot Tissue Foot, Right SURGICAL PATHOLOGY Evelina Bucy, DPM 07/05/2019 1338   A : Metatarsal bone clean margin Tissue Foot, Right AEROBIC/ANAEROBIC CULTURE (SURGICAL/DEEP WOUND) Evelina Bucy, DPM 07/05/2019 1317   B : Right foot swab Tissue Foot, Right AEROBIC/ANAEROBIC CULTURE (SURGICAL/DEEP WOUND) Evelina Bucy, DPM 07/05/2019 1346    Anesthesia:  Hemostasis:  Total Tourniquet Time Documented: Calf (Right) - 76 minutes Total: Calf (Right) - 76 minutes  Estimated Blood Loss: 15 mL Materials: * No implants in log * Medications: 1g Vancomycin powder Complications: none  Indications for Procedure:  This is a 53 y.o. female with a chronic ulcer to the right foot. The wound has worsened and showed signs of acute infection   Procedure in Detail: Patient was identified in pre-operative holding area. Formal consent was signed and the right lower extremity was marked. Patient was brought back to the operating room. Anesthesia was induced. The extremity was prepped and draped in the usual sterile fashion. Timeout was taken to confirm patient name, laterality, and procedure prior to incision.   5th Metatarsal Metatarsectomy Attention was then directed to the right lateral foot.   An incision was made lateral to the fifth metatarsal area.  Dissection was carried down sharply to the level of the bone. All soft tissue attachments were released from the base of the bone. The peroneus brevis tendon was quite thin and attenuated. The metatarsal base was excised with a sagittal saw. The bone was rather soft. This was collected for pathology. Additional sections of metatarsal bone were resected until firm bone was noted. The final segment was collected as a clean specimen for microbiology.  There was an additional firm mass between the interval of the bone and the ulcer. This had the atypical appearance, with some aspects of tendon and some encapsulated fat. This was collected for pathology and sent separately.  Peroneal Tenodesis The area was then copiously irrigated. Dissection was continued to the area plantar to the cuboid. The tendon slip of the peroneus longus tendon was located. The small remaining end of the peroneus brevis was sutured to the visible tendon plantar to the cuboid. The area was again copiously irrigated. Vancomycin powder was then packed into the wound. The deep tissue over the area of the previous bone resection was closed with 3-0 vicryl.  Excision of Ulcer and Adjacent Skin Transfer / Rotation Flap  Attention was then directed to the plantar foot ulcer. The ulcer measured 2x1. The ulcer was primarily excised. This led to a triangular skin deficit of 2x3 cm. In order to facilitate closure, a small rotation flap was incised to cover the deficit. The skin was undermined to allow for mobilization of the skin. The deep tissue was closed with 3-0 vicryl. The skin was then closed with 3-0 nylon horizontal mattress sutures in  order to bring the skin edges together. Two additional over and over trauma stitches were used to additionally coapt the skin edges. There was a triangular aspect where the rotation flap met the dorsal skin, this was adhered with corner stiches. The  skin was finally closed with skin staples. The remainder of the incision was primarily closed with 3-0 nylon and skin staples.   Application of Wound VAC The foot was then dressed with a Prevena incisional wound VAC. Patient tolerated the procedure well.   Disposition: Following a period of post-operative monitoring, patient will be transferred back to the floor.

## 2019-07-05 NOTE — Anesthesia Postprocedure Evaluation (Signed)
Anesthesia Post Note  Patient: Katherine Herrera  Procedure(s) Performed: RIGHT FOOT 5TH METATARSAL RESECTION; PERONEAL TENDONESIS, EXCISION OF ULCER, LOCAL TISSUE TRANSFER  (Right Toe) Application Of Wound Vac (Right Foot)     Patient location during evaluation: PACU Anesthesia Type: General Level of consciousness: awake and alert Pain management: pain level controlled Vital Signs Assessment: post-procedure vital signs reviewed and stable Respiratory status: spontaneous breathing, nonlabored ventilation, respiratory function stable and patient connected to nasal cannula oxygen Cardiovascular status: blood pressure returned to baseline and stable Postop Assessment: no apparent nausea or vomiting Anesthetic complications: no    Last Vitals:  Vitals:   07/05/19 1558 07/05/19 1627  BP: 133/78 (!) 144/90  Pulse: 62 65  Resp: (!) 21 20  Temp:  (!) 36.3 C  SpO2: 94% 97%    Last Pain:  Vitals:   07/05/19 1636  TempSrc:   PainSc: 10-Worst pain ever                 Ryan P Ellender

## 2019-07-05 NOTE — Progress Notes (Signed)
SARS Corona Virus test  and MRSA PCR screening test done and sent to Lab.

## 2019-07-05 NOTE — Progress Notes (Signed)
Pharmacy Antibiotic Note  Katherine Herrera is a 53 y.o. female admitted on 07/04/2019 with cellulitis.  Pharmacy has been consulted for vancomycin dosing.  Plan: Vancomycin 2gm IV x 1 then 1750 mg IV q24 hours F/u renal function, cultures and clinical course     Temp (24hrs), Avg:98.4 F (36.9 C), Min:97.9 F (36.6 C), Max:98.8 F (37.1 C)  Recent Labs  Lab 07/04/19 2338  WBC 12.6*  CREATININE 1.31*    Estimated Creatinine Clearance: 63.7 mL/min (A) (by C-G formula based on SCr of 1.31 mg/dL (H)).    Allergies  Allergen Reactions  . Hydromorphone Hcl Itching  . Cephalexin Nausea And Vomiting  . Demerol  [Meperidine Hcl] Rash  . Lovastatin Other (See Comments)    Other reaction(s): Unknown (See Comments) Possible myalgia Unknown   . Metformin And Related Nausea And Vomiting  . Sulfamethoxazole-Trimethoprim     Other reaction(s): Unknown DILI, pancreatitis  . Crestor [Rosuvastatin Calcium]     myalgia  . Niacin Other (See Comments)    Unknown   . Paroxetine Other (See Comments)    Other reaction(s): Unknown (See Comments) unknown Unknown     Thank you for allowing pharmacy to be a part of this patient's care.  Danny Zimny, [place] 07/05/2019 12:38 AM

## 2019-07-05 NOTE — Progress Notes (Signed)
PROGRESS NOTE  Katherine Herrera RCV:[id number] DOB: [birth year] DOA: 07/04/2019 PCP: Cassandria Anger, MD  HPI/Recap of past 24 hours: HPI from Dr Preston Fleeting Katherine Herrera is a 53 y.o. female with medical history significant of diabetes, hyperlipidemia, hypothyroidism, COPD who was sent over for direct admission by podiatry.  Patient went to see them with right lower extremity diabetic ulcer and swelling. Patient was initiated on ciprofloxacin and clindamycin, without significant improvement. There was concern for possible osteomyelitis and pt was therefore sent over for direct admission for further management as she failed outpatient treatment. Podiatry will follow-up.   Today, pt reported chronic back pain, denies any fever/chills, n/v/d, abdominal pain, chest pain, SOB. Requesting her pain meds.    Assessment/Plan: Principal Problem:   Diabetic foot ulcer ([place]) Active Problems:   Hypothyroidism   Dyslipidemia   GOUT   Anxiety state   Diabetic neuropathy ([place])   Right foot ulcer ([place])  Diabetic R foot ulceration with cellulitis, concern for osteomyelitis of 5th metatarsal R foot Currently afebrile, with leukocytosis ESR, CRP pending BC X 2 pending MRI R foot suspicious for early osteomyelitis Podiatry on board: For 5th metatarsal excision on 07/05/19 Continue IV vancomycin, ceftriaxone  Monitor closely  AKI Likely 2/2 to above Vs poor oral intake Continue IVF Daily BMP  Diabetes mellitus type 2 Uncontrolled, A1c 8.1 SSI, novolog, hypoglycemic protocol, accuchecks Start levemir 10U BID  HTN Continue to hold home lisinopril 2/2 to AKI  Hypothyroidism Continue synthroid  Chronic back pain/peripheral neuropathy Continue home baclofen, gabapentin, oxycodone   Anxiety Continue home klonopin prn  Seizure disorder/RLS Continue Lamictal, Requip  Tobacco abuse Continue nicotine patch       Malnutrition Type:      Malnutrition Characteristics:       Nutrition Interventions:       Estimated body mass index is 33.79 kg/m as calculated from the following:   Height as of this encounter: '5\' 9"'  (1.753 m).   Weight as of this encounter: 103.8 kg.     Code Status: Full  Family Communication: None at bedside  Disposition Plan: TBD    Consultants:  Podiatry    Procedures:  Amputation of 5th metatarsal on 07/05/19  Antimicrobials:  Vancomycin  Ceftriaxone   DVT prophylaxis: Lovenox   Objective: Vitals:   07/05/19 0043 07/05/19 0535 07/05/19 1210 07/05/19 1231  BP: (!) 155/81 104/74 126/72   Pulse: 94 68 61   Resp: (!) '24 13 17   ' Temp: 98.1 F (36.7 C) 97.6 F (36.4 C) 98.1 F (36.7 C)   TempSrc:  Oral Oral   SpO2: 98% 92% 99%   Weight:    103.8 kg  Height:    '5\' 9"'  (1.753 m)    Intake/Output Summary (Last 24 hours) at 07/05/2019 1259 Last data filed at 07/05/2019 0400 Gross per 24 hour  Intake 896.56 ml  Output -  Net 896.56 ml   Filed Weights   07/05/19 1231  Weight: 103.8 kg    Exam:  General: NAD   Cardiovascular: S1, S2 present  Respiratory: CTAB  Abdomen: Soft, nontender, nondistended, bowel sounds present  Musculoskeletal: +1 bilateral pedal edema noted  Skin: Ulcer noted on R foot, warmth, erythema, edema, without drainage   Psychiatry: Normal mood     Data Reviewed: CBC: Recent Labs  Lab 07/04/19 2338 07/05/19 0243  WBC 12.6* 11.9*  HGB 14.7 12.6  HCT 44.8 39.1  MCV 87.3 87.9  PLT 321 102   Basic Metabolic Panel:  Recent Labs  Lab 07/04/19 2338 07/05/19 0243  NA  --  136  K  --  4.2  CL  --  104  CO2  --  24  GLUCOSE  --  241*  BUN  --  24*  CREATININE 1.31* 1.26*  CALCIUM  --  8.1*   GFR: Estimated Creatinine Clearance: 66.2 mL/min (A) (by C-G formula based on SCr of 1.26 mg/dL (H)). Liver Function Tests: Recent Labs  Lab 07/05/19 0243  AST 16  ALT 13  ALKPHOS 94  BILITOT 0.4  PROT 6.4*  ALBUMIN 2.8*   No results for input(s): LIPASE, AMYLASE in the  last 168 hours. No results for input(s): AMMONIA in the last 168 hours. Coagulation Profile: No results for input(s): INR, PROTIME in the last 168 hours. Cardiac Enzymes: No results for input(s): CKTOTAL, CKMB, CKMBINDEX, TROPONINI in the last 168 hours. BNP (last 3 results) No results for input(s): PROBNP in the last 8760 hours. HbA1C: Recent Labs    07/04/19 2338  HGBA1C 8.1*   CBG: Recent Labs  Lab 07/04/19 2342 07/05/19 0820 07/05/19 1207  GLUCAP 315* 190* 231*   Lipid Profile: No results for input(s): CHOL, HDL, LDLCALC, TRIG, CHOLHDL, LDLDIRECT in the last 72 hours. Thyroid Function Tests: No results for input(s): TSH, T4TOTAL, FREET4, T3FREE, THYROIDAB in the last 72 hours. Anemia Panel: No results for input(s): VITAMINB12, FOLATE, FERRITIN, TIBC, IRON, RETICCTPCT in the last 72 hours. Urine analysis:    Component Value Date/Time   COLORURINE YELLOW 02/20/2019 1330   APPEARANCEUR CLEAR 02/20/2019 1330   LABSPEC 1.010 02/20/2019 1330   PHURINE 5.5 02/20/2019 1330   GLUCOSEU NEGATIVE 02/20/2019 1330   HGBUR NEGATIVE 02/20/2019 1330   BILIRUBINUR NEGATIVE 02/20/2019 1330   KETONESUR NEGATIVE 02/20/2019 1330   PROTEINUR NEGATIVE 09/28/2018 2318   UROBILINOGEN 0.2 02/20/2019 1330   NITRITE NEGATIVE 02/20/2019 1330   LEUKOCYTESUR NEGATIVE 02/20/2019 1330   Sepsis Labs: '@LABRCNTIP' (procalcitonin:4,lacticidven:4)  ) Recent Results (from the past 240 hour(s))  MRSA PCR Screening     Status: None   Collection Time: 07/05/19 12:46 AM   Specimen: Nasopharyngeal  Result Value Ref Range Status   MRSA by PCR NEGATIVE NEGATIVE Final    Comment:        The GeneXpert MRSA Assay (FDA approved for NASAL specimens only), is one component of a comprehensive MRSA colonization surveillance program. It is not intended to diagnose MRSA infection nor to guide or monitor treatment for MRSA infections. Performed at [facility], [place] [address] [place], [place]  [address]   SARS CORONAVIRUS 2 (TAT 6-24 HRS) Nasopharyngeal Nasopharyngeal Swab     Status: None   Collection Time: 07/05/19 12:46 AM   Specimen: Nasopharyngeal Swab  Result Value Ref Range Status   SARS Coronavirus 2 NEGATIVE NEGATIVE Final    Comment: (NOTE) SARS-CoV-2 target nucleic acids are NOT DETECTED. The SARS-CoV-2 RNA is generally detectable in upper and lower respiratory specimens during the acute phase of infection. Negative results do not preclude SARS-CoV-2 infection, do not rule out co-infections with other pathogens, and should not be used as the sole basis for treatment or other patient management decisions. Negative results must be combined with clinical observations, patient history, and epidemiological information. The expected result is Negative. Fact Sheet for Patients: SugarRoll.be Fact Sheet for Healthcare Providers: https://www.woods-mathews.com/ This test is not yet approved or cleared by the [place] FDA and  has been authorized for detection and/or diagnosis of SARS-CoV-2 by FDA under an  Emergency Use Authorization (EUA). This EUA will remain  in effect (meaning this test can be used) for the duration of the COVID-19 declaration under Section 56 4(b)(1) of the Act, 21 U.S.C. section 360bbb-3(b)(1), unless the authorization is terminated or revoked sooner. Performed at [facility], [place] [address], [place], [place] [address]       Studies: Mr Foot Right Wo Contrast  Result Date: 07/05/2019 CLINICAL DATA:  Diabetic with right foot ulcer and cellulitis. Limited response to antibiotics. Clinical concern of osteomyelitis. EXAM: MRI OF THE RIGHT FOREFOOT WITHOUT CONTRAST TECHNIQUE: Multiplanar, multisequence MR imaging of the right forefoot was performed. No intravenous contrast was administered. COMPARISON:  Radiographs 06/20/2019, 04/02/2019 and 01/02/2019. FINDINGS: Bones/Joint/Cartilage Patient underwent  resection of the 5th metatarsal head on 01/02/2019. The amputation site appears sharp. There is periosteal thickening involving the base of the 5th metatarsal which appears chronic. Low-level marrow T2 hyperintensity is present within the base of the 5th metatarsal without corresponding T1 signal abnormality or cortical destruction. There are overlying soft tissue changes with inflammation and possible soft tissue emphysema in this area. There is mild T2 hyperintensity laterally in the cuboid without corresponding T1 signal abnormality or cortical destruction. Apparent arthropathy at the 2nd DIP joint. No other focal osseous lesions are identified. There are mild degenerative changes in the midfoot. Ligaments Intact Lisfranc ligament. Muscles and Tendons Diffuse forefoot muscular atrophy without focal fluid collection. No significant tenosynovitis. Soft tissues There is skin irregularity and ulceration along the plantar and lateral aspect of the 5th metatarsal base. There are inflammatory changes and possible soft tissue emphysema in the subcutaneous fat. There is no well-defined fluid collection. As noted above, the soft tissue changes are adjacent to marrow changes in the base of the 5th metatarsal. IMPRESSION: 1. Soft tissue ulceration along the plantar and lateral aspect of the 5th metatarsal base with underlying inflammatory changes and possible soft tissue emphysema. These findings are in close proximity to T2 marrow changes in the base of the 5th metatarsal, suspicious for early osteomyelitis. There is no cortical destruction. 2. 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 3. Apparent arthropathy at the 2nd DIP joint. Electronically Signed   By: Richardean Sale M.D.   On: 07/05/2019 12:46    Scheduled Meds: . [MAR Hold] aspirin  325 mg Oral Daily  . [MAR Hold] baclofen  20 mg Oral BID  . [MAR Hold] enoxaparin (LOVENOX) injection  40 mg Subcutaneous Q24H  . [MAR Hold] gabapentin  800 mg Oral QID  .  [MAR Hold] insulin aspart  0-20 Units Subcutaneous TID WC  . [MAR Hold] insulin aspart  0-5 Units Subcutaneous QHS  . insulin detemir  10 Units Subcutaneous BID  . [MAR Hold] lamoTRIgine  25 mg Oral BID  . [MAR Hold] levothyroxine  75 mcg Oral Q0600  . [MAR Hold] nicotine  21 mg Transdermal Daily  . [MAR Hold] rOPINIRole  0.5 mg Oral QHS    Continuous Infusions: . sodium chloride 100 mL/hr at 07/05/19 1148  . [MAR Hold] cefTRIAXone (ROCEPHIN)  IV 1 g (07/05/19 0953)  . clindamycin    . clindamycin (CLEOCIN) IV    . lactated ringers 10 mL/hr at 07/05/19 1236  . [MAR Hold] vancomycin       LOS: 1 day     Alma Friendly, MD [facility]  If 7PM-7AM, please contact night-coverage [email or web address] 07/05/2019, 12:59 PM

## 2019-07-06 ENCOUNTER — Inpatient Hospital Stay (HOSPITAL_COMMUNITY): Payer: Medicare Other

## 2019-07-06 ENCOUNTER — Encounter (HOSPITAL_COMMUNITY): Payer: Self-pay | Admitting: Podiatry

## 2019-07-06 DIAGNOSIS — R52 Pain, unspecified: Secondary | ICD-10-CM

## 2019-07-06 LAB — CBC WITH DIFFERENTIAL/PLATELET
Abs Immature Granulocytes: 0.06 10*3/uL (ref 0.00–0.07)
Basophils Absolute: 0.1 10*3/uL (ref 0.0–0.1)
Basophils Relative: 0 %
Eosinophils Absolute: 0.1 10*3/uL (ref 0.0–0.5)
Eosinophils Relative: 1 %
HCT: 39.6 % (ref 36.0–46.0)
Hemoglobin: 12.5 g/dL (ref 12.0–15.0)
Immature Granulocytes: 1 %
Lymphocytes Relative: 22 %
Lymphs Abs: 2.7 10*3/uL (ref 0.7–4.0)
MCH: 28.2 pg (ref 26.0–34.0)
MCHC: 31.6 g/dL (ref 30.0–36.0)
MCV: 89.4 fL (ref 80.0–100.0)
Monocytes Absolute: 0.7 10*3/uL (ref 0.1–1.0)
Monocytes Relative: 5 %
Neutro Abs: 8.7 10*3/uL — ABNORMAL HIGH (ref 1.7–7.7)
Neutrophils Relative %: 71 %
Platelets: 281 10*3/uL (ref 150–400)
RBC: 4.43 MIL/uL (ref 3.87–5.11)
RDW: 13.6 % (ref 11.5–15.5)
WBC: 12.2 10*3/uL — ABNORMAL HIGH (ref 4.0–10.5)
nRBC: 0 % (ref 0.0–0.2)

## 2019-07-06 LAB — BASIC METABOLIC PANEL
Anion gap: 7 (ref 5–15)
BUN: 25 mg/dL — ABNORMAL HIGH (ref 6–20)
CO2: 24 mmol/L (ref 22–32)
Calcium: 8 mg/dL — ABNORMAL LOW (ref 8.9–10.3)
Chloride: 102 mmol/L (ref 98–111)
Creatinine, Ser: 1.25 mg/dL — ABNORMAL HIGH (ref 0.44–1.00)
GFR calc Af Amer: 57 mL/min — ABNORMAL LOW (ref >60)
GFR calc non Af Amer: 49 mL/min — ABNORMAL LOW (ref >60)
Glucose, Bld: 375 mg/dL — ABNORMAL HIGH (ref 70–99)
Potassium: 5 mmol/L (ref 3.5–5.1)
Sodium: 133 mmol/L — ABNORMAL LOW (ref 135–145)

## 2019-07-06 LAB — GLUCOSE, CAPILLARY
Glucose-Capillary: 347 mg/dL — ABNORMAL HIGH (ref 70–99)
Glucose-Capillary: 289 mg/dL — ABNORMAL HIGH (ref 70–99)
Glucose-Capillary: 198 mg/dL — ABNORMAL HIGH (ref 70–99)
Glucose-Capillary: 296 mg/dL — ABNORMAL HIGH (ref 70–99)

## 2019-07-06 MED ORDER — INSULIN DETEMIR 100 UNIT/ML ~~LOC~~ SOLN
15.0000 [IU] | Freq: Two times a day (BID) | SUBCUTANEOUS | Status: DC
  Administered 2019-07-06 – 2019-07-10 (×8): 15 [IU] via SUBCUTANEOUS
  Filled 2019-07-06 (×9): qty 0.15

## 2019-07-06 MED ORDER — POLYETHYLENE GLYCOL 3350 17 G PO PACK
17.0000 g | PACK | Freq: Two times a day (BID) | ORAL | Status: DC
  Administered 2019-07-06 – 2019-07-09 (×6): 17 g via ORAL
  Filled 2019-07-06 (×7): qty 1

## 2019-07-06 MED ORDER — SENNOSIDES-DOCUSATE SODIUM 8.6-50 MG PO TABS
1.0000 | ORAL_TABLET | Freq: Two times a day (BID) | ORAL | Status: DC
  Administered 2019-07-06 – 2019-07-10 (×7): 1 via ORAL
  Filled 2019-07-06 (×8): qty 1

## 2019-07-06 MED ORDER — DIPHENHYDRAMINE HCL 25 MG PO CAPS: 25.0000 mg | ORAL_CAPSULE | Freq: Three times a day (TID) | ORAL | Status: DC | PRN

## 2019-07-06 MED ORDER — MORPHINE SULFATE (PF) 2 MG/ML IV SOLN
2.0000 mg | INTRAVENOUS | Status: DC | PRN
  Administered 2019-07-06 – 2019-07-08 (×5): 2 mg via INTRAVENOUS
  Filled 2019-07-06 (×5): qty 1

## 2019-07-06 NOTE — Progress Notes (Signed)
PROGRESS NOTE  Katherine Herrera:[id number] DOB: 09-05-1966 DOA: 07/04/2019 PCP: Cassandria Anger, MD  HPI/Recap of past 24 hours: HPI from Dr Preston Fleeting Katherine Herrera is a 53 y.o. female with medical history significant of diabetes, hyperlipidemia, hypothyroidism, COPD who was sent over for direct admission by podiatry.  Patient went to see them with right lower extremity diabetic ulcer and swelling. Patient was initiated on ciprofloxacin and clindamycin, without significant improvement. There was concern for possible osteomyelitis and pt was therefore sent over for direct admission for further management as she failed outpatient treatment. Podiatry will follow-up.   Today, patient denies any new complaints, having postop pain.     Assessment/Plan: Principal Problem:   Diabetic foot ulcer ([place]) Active Problems:   Hypothyroidism   Dyslipidemia   GOUT   Anxiety state   Diabetic neuropathy ([place])   Right foot ulcer ([place])   Peroneal tendonitis, right  Diabetic R foot ulceration with cellulitis, concern for osteomyelitis of 5th metatarsal R foot s/p fifth metatarsal resection, wound excision and closure, application of wound VAC on 07/05/2019 by podiatry Currently afebrile, with leukocytosis ESR 48, CRP 1 BC X 2 NGTD MRI R foot suspicious for early osteomyelitis Podiatry on board Continue IV vancomycin, ceftriaxone, pending cultures Pain management by podiatry/surgery, bowel regimen Monitor closely  AKI Likely 2/2 to above Vs poor oral intake Vs ?CKD Continue IVF Daily BMP  Diabetes mellitus type 2 Uncontrolled, A1c 8.1 SSI, novolog, hypoglycemic protocol, accuchecks Started levemir 10U BID, increase to 15U BID  HTN Continue to hold home lisinopril 2/2 to AKI  Hypothyroidism Continue synthroid  Chronic back pain/peripheral neuropathy Continue home baclofen, gabapentin, oxycodone   Anxiety Continue home klonopin prn  Seizure disorder/RLS Continue Lamictal, Requip   Tobacco abuse Continue nicotine patch       Malnutrition Type:      Malnutrition Characteristics:      Nutrition Interventions:       Estimated body mass index is 33.79 kg/m as calculated from the following:   Height as of this encounter: _0  (1.753 m).   Weight as of this encounter: 103.8 kg.     Code Status: Full  Family Communication: None at bedside  Disposition Plan: CIR   Consultants:  Podiatry    Procedures:  Amputation of 5th metatarsal on 07/05/19  Antimicrobials:  Vancomycin  Ceftriaxone   DVT prophylaxis: Lovenox   Objective: Vitals:   07/05/19 2140 07/05/19 2307 07/06/19 0607 07/06/19 1239  BP: 135/74 125/76 132/80 128/72  Pulse: 72 75 68 60  Resp: _1 Temp: 98 F (36.7 C) 98.1 F (36.7 C) (!) 97.5 F (36.4 C) 97.9 F (36.6 C)  TempSrc:      SpO2: 97% 97% 99% 100%  Weight:      Height:        Intake/Output Summary (Last 24 hours) at 07/06/2019 1609 Last data filed at 07/06/2019 0900 Gross per 24 hour  Intake 3044.23 ml  Output 950 ml  Net 2094.23 ml   Filed Weights   07/05/19 1231  Weight: 103.8 kg    Exam:  General: NAD   Cardiovascular: S1, S2 present  Respiratory: CTAB  Abdomen: Soft, nontender, nondistended, bowel sounds present  Musculoskeletal: Trace bilateral pedal edema noted, right foot dressing C/D/I  Skin:  As above  Psychiatry: Normal mood     Data Reviewed: CBC: Recent Labs  Lab 07/04/19 2338 07/05/19 0243 07/06/19 0336  WBC 12.6* 11.9* 12.2*  NEUTROABS  --   --  8.7*  HGB 14.7 12.6 12.5  HCT 44.8 39.1 39.6  MCV 87.3 87.9 89.4  PLT 321 298 248   Basic Metabolic Panel: Recent Labs  Lab 07/04/19 2338 07/05/19 0243 07/06/19 0336  NA  --  136 133*  K  --  4.2 5.0  CL  --  104 102  CO2  --  24 24  GLUCOSE  --  241* 375*  BUN  --  24* 25*  CREATININE 1.31* 1.26* 1.25*  CALCIUM  --  8.1* 8.0*   GFR: Estimated Creatinine Clearance: 66.7 mL/min (A) (by C-G formula  based on SCr of 1.25 mg/dL (H)). Liver Function Tests: Recent Labs  Lab 07/05/19 0243  AST 16  ALT 13  ALKPHOS 94  BILITOT 0.4  PROT 6.4*  ALBUMIN 2.8*   No results for input(s): LIPASE, AMYLASE in the last 168 hours. No results for input(s): AMMONIA in the last 168 hours. Coagulation Profile: No results for input(s): INR, PROTIME in the last 168 hours. Cardiac Enzymes: No results for input(s): CKTOTAL, CKMB, CKMBINDEX, TROPONINI in the last 168 hours. BNP (last 3 results) No results for input(s): PROBNP in the last 8760 hours. HbA1C: Recent Labs    07/04/19 2338  HGBA1C 8.1*   CBG: Recent Labs  Lab 07/05/19 1443 07/05/19 1651 07/05/19 2141 07/06/19 0802 07/06/19 1222  GLUCAP 175* 168* 264* 347* 289*   Lipid Profile: No results for input(s): CHOL, HDL, LDLCALC, TRIG, CHOLHDL, LDLDIRECT in the last 72 hours. Thyroid Function Tests: No results for input(s): TSH, T4TOTAL, FREET4, T3FREE, THYROIDAB in the last 72 hours. Anemia Panel: No results for input(s): VITAMINB12, FOLATE, FERRITIN, TIBC, IRON, RETICCTPCT in the last 72 hours. Urine analysis:    Component Value Date/Time   COLORURINE YELLOW 02/20/2019 1330   APPEARANCEUR CLEAR 02/20/2019 1330   LABSPEC 1.010 02/20/2019 1330   PHURINE 5.5 02/20/2019 1330   GLUCOSEU NEGATIVE 02/20/2019 1330   HGBUR NEGATIVE 02/20/2019 1330   BILIRUBINUR NEGATIVE 02/20/2019 1330   KETONESUR NEGATIVE 02/20/2019 1330   PROTEINUR NEGATIVE 09/28/2018 2318   UROBILINOGEN 0.2 02/20/2019 1330   NITRITE NEGATIVE 02/20/2019 1330   LEUKOCYTESUR NEGATIVE 02/20/2019 1330   Sepsis Labs: _0 (procalcitonin:4,lacticidven:4)  ) Recent Results (from the past 240 hour(s))  MRSA PCR Screening     Status: None   Collection Time: 07/05/19 12:46 AM   Specimen: Nasopharyngeal  Result Value Ref Range Status   MRSA by PCR NEGATIVE NEGATIVE Final    Comment:        The GeneXpert MRSA Assay (FDA approved for NASAL specimens only), is  one component of a comprehensive MRSA colonization surveillance program. It is not intended to diagnose MRSA infection nor to guide or monitor treatment for MRSA infections. Performed at [facility], [place] [address] [place], [place] [address]   SARS CORONAVIRUS 2 (TAT 6-24 HRS) Nasopharyngeal Nasopharyngeal Swab     Status: None   Collection Time: 07/05/19 12:46 AM   Specimen: Nasopharyngeal Swab  Result Value Ref Range Status   SARS Coronavirus 2 NEGATIVE NEGATIVE Final    Comment: (NOTE) SARS-CoV-2 target nucleic acids are NOT DETECTED. The SARS-CoV-2 RNA is generally detectable in upper and lower respiratory specimens during the acute phase of infection. Negative results do not preclude SARS-CoV-2 infection, do not rule out co-infections with other pathogens, and should not be used as the sole basis for treatment or other patient management decisions. Negative results must be combined with clinical observations, patient history, and epidemiological information. The expected  result is Negative. Fact Sheet for Patients: SugarRoll.be Fact Sheet for Healthcare Providers: https://www.woods-mathews.com/ This test is not yet approved or cleared by the [place] FDA and  has been authorized for detection and/or diagnosis of SARS-CoV-2 by FDA under an Emergency Use Authorization (EUA). This EUA will remain  in effect (meaning this test can be used) for the duration of the COVID-19 declaration under Section 56 4(b)(1) of the Act, 21 U.S.C. section 360bbb-3(b)(1), unless the authorization is terminated or revoked sooner. Performed at [facility], [place] [address], [place], [place] [address]   Aerobic/Anaerobic Culture (surgical/deep wound)     Status: None (Preliminary result)   Collection Time: 07/05/19  1:17 PM   Specimen: Foot, Right; Tissue  Result Value Ref Range Status   Specimen Description BONE  Final   Special  Requests RIGHT FOOT SAMPLE A METATARSAL CLEAN MARGIN  Final   Gram Stain NO WBC SEEN NO ORGANISMS SEEN   Final   Culture   Final    NO GROWTH < 24 HOURS Performed at [facility], [place] [address] [place], [place] [address]    Report Status PENDING  Incomplete  Aerobic/Anaerobic Culture (surgical/deep wound)     Status: None (Preliminary result)   Collection Time: 07/05/19  1:46 PM   Specimen: Foot, Right; Tissue  Result Value Ref Range Status   Specimen Description FOOT  Final   Special Requests RIGHT  Final   Gram Stain   Final    RARE WBC PRESENT, PREDOMINANTLY PMN NO ORGANISMS SEEN    Culture   Final    NO GROWTH 1 DAY Performed at [facility], [place] [address] [place], [place] [address]    Report Status PENDING  Incomplete  Culture, blood (routine x 2)     Status: None (Preliminary result)   Collection Time: 07/05/19  4:36 PM   Specimen: BLOOD RIGHT ARM  Result Value Ref Range Status   Specimen Description BLOOD RIGHT ARM  Final   Special Requests   Final    BOTTLES DRAWN AEROBIC ONLY Blood Culture adequate volume   Culture   Final    NO GROWTH < 24 HOURS Performed at [facility], [place] [address], [place], [place] [address]    Report Status PENDING  Incomplete  Culture, blood (routine x 2)     Status: None (Preliminary result)   Collection Time: 07/05/19  4:36 PM   Specimen: BLOOD RIGHT ARM  Result Value Ref Range Status   Specimen Description BLOOD RIGHT ARM  Final   Special Requests   Final    BOTTLES DRAWN AEROBIC ONLY Blood Culture adequate volume   Culture   Final    NO GROWTH < 24 HOURS Performed at [facility], [place] [address], [place], [place] [address]    Report Status PENDING  Incomplete      Studies: No results found.  Scheduled Meds: . aspirin  325 mg Oral Daily  . baclofen  20 mg Oral BID  . enoxaparin (LOVENOX) injection  40 mg Subcutaneous Q24H  . gabapentin  800 mg Oral QID  . insulin aspart  0-20 Units  Subcutaneous TID WC  . insulin aspart  0-5 Units Subcutaneous QHS  . insulin detemir  10 Units Subcutaneous BID  . lamoTRIgine  25 mg Oral BID  . levothyroxine  75 mcg Oral Q0600  . nicotine  21 mg Transdermal Daily  . rOPINIRole  0.5 mg Oral QHS  .  sodium chloride flush  10-40 mL Intracatheter Q12H    Continuous Infusions: . sodium chloride 100 mL/hr at 07/06/19 1524  . cefTRIAXone (ROCEPHIN)  IV 2 g (07/06/19 0827)  . lactated ringers 10 mL/hr at 07/05/19 1236  . vancomycin 1,250 mg (07/05/19 2338)     LOS: 2 days     Alma Friendly, MD [facility]  If 7PM-7AM, please contact night-coverage [email or web address] 07/06/2019, 4:09 PM

## 2019-07-06 NOTE — Progress Notes (Signed)
Subjective:  Patient ID: Katherine Herrera, female    DOB: [birth year],  MRN: [id number]  Patient seen at bedside. Having a lot of pain. Objective:   Vitals:   07/05/19 2307 07/06/19 0607  BP: 125/76 132/80  Pulse: 75 68  Resp: 16 14  Temp: 98.1 F (36.7 C) (!) 97.5 F (36.4 C)  SpO2: 97% 99%   General AA&O x3. Normal mood and affect.  Vascular Dorsalis pedis and posterior tibial pulses 2/4 bilat. Brisk capillary refill to all digits. Pedal hair present.  Neurologic Epicritic sensation grossly diminished.  Dermatologic Cellulitis improved to right foot. Edema reduced. Ulcer without active drainage. Still with local warmth and slight erythema Right leg edematous, pitting edema.  Orthopedic: Positive motor distally.   Results for orders placed or performed during the hospital encounter of 07/04/19  MRSA PCR Screening     Status: None   Collection Time: 07/05/19 12:46 AM   Specimen: Nasopharyngeal  Result Value Ref Range Status   MRSA by PCR NEGATIVE NEGATIVE Final    Comment:        The GeneXpert MRSA Assay (FDA approved for NASAL specimens only), is one component of a comprehensive MRSA colonization surveillance program. It is not intended to diagnose MRSA infection nor to guide or monitor treatment for MRSA infections. Performed at [facility], [place] [address], [place], [place] [address]   SARS CORONAVIRUS 2 (TAT 6-24 HRS) Nasopharyngeal Nasopharyngeal Swab     Status: None   Collection Time: 07/05/19 12:46 AM   Specimen: Nasopharyngeal Swab  Result Value Ref Range Status   SARS Coronavirus 2 NEGATIVE NEGATIVE Final    Comment: (NOTE) SARS-CoV-2 target nucleic acids are NOT DETECTED. The SARS-CoV-2 RNA is generally detectable in upper and lower respiratory specimens during the acute phase of infection. Negative results do not preclude SARS-CoV-2 infection, do not rule out co-infections with other pathogens, and should not be used as the sole basis for  treatment or other patient management decisions. Negative results must be combined with clinical observations, patient history, and epidemiological information. The expected result is Negative. Fact Sheet for Patients: SugarRoll.be Fact Sheet for Healthcare Providers: https://www.woods-mathews.com/ This test is not yet approved or cleared by the [place] FDA and  has been authorized for detection and/or diagnosis of SARS-CoV-2 by FDA under an Emergency Use Authorization (EUA). This EUA will remain  in effect (meaning this test can be used) for the duration of the COVID-19 declaration under Section 56 4(b)(1) of the Act, 21 U.S.C. section 360bbb-3(b)(1), unless the authorization is terminated or revoked sooner. Performed at [facility], [place] [address], [place], [place] [address]   Aerobic/Anaerobic Culture (surgical/deep wound)     Status: None (Preliminary result)   Collection Time: 07/05/19  1:17 PM   Specimen: Foot, Right; Tissue  Result Value Ref Range Status   Specimen Description BONE  Final   Special Requests RIGHT FOOT SAMPLE A METATARSAL CLEAN MARGIN  Final   Gram Stain   Final    NO WBC SEEN NO ORGANISMS SEEN Performed at [facility], [place] [address] [place], [place] [address]    Culture PENDING  Incomplete   Report Status PENDING  Incomplete  Aerobic/Anaerobic Culture (surgical/deep wound)     Status: None (Preliminary result)   Collection Time: 07/05/19  1:46 PM   Specimen: Foot, Right; Tissue  Result Value Ref Range Status   Specimen Description FOOT  Final   Special Requests RIGHT  Final  Gram Stain   Final    RARE WBC PRESENT, PREDOMINANTLY PMN NO ORGANISMS SEEN Performed at [facility], [place] [address], [place], [place] [address]    Culture PENDING  Incomplete   Report Status PENDING  Incomplete   Results for orders placed or performed during the hospital encounter of 07/04/19 (from the past  24 hour(s))  Glucose, capillary     Status: Abnormal   Collection Time: 07/05/19 12:07 PM  Result Value Ref Range   Glucose-Capillary 231 (H) 70 - 99 mg/dL  Aerobic/Anaerobic Culture (surgical/deep wound)     Status: None (Preliminary result)   Collection Time: 07/05/19  1:17 PM   Specimen: Foot, Right; Tissue  Result Value Ref Range   Specimen Description BONE    Special Requests RIGHT FOOT SAMPLE A METATARSAL CLEAN MARGIN    Gram Stain      NO WBC SEEN NO ORGANISMS SEEN Performed at [facility], [place] [address] [place], [place] [address]    Culture PENDING    Report Status PENDING   Aerobic/Anaerobic Culture (surgical/deep wound)     Status: None (Preliminary result)   Collection Time: 07/05/19  1:46 PM   Specimen: Foot, Right; Tissue  Result Value Ref Range   Specimen Description FOOT    Special Requests RIGHT    Gram Stain      RARE WBC PRESENT, PREDOMINANTLY PMN NO ORGANISMS SEEN Performed at [facility], [place] [address], [place], [place] [address]    Culture PENDING    Report Status PENDING   Glucose, capillary     Status: Abnormal   Collection Time: 07/05/19  2:43 PM  Result Value Ref Range   Glucose-Capillary 175 (H) 70 - 99 mg/dL  Sedimentation rate     Status: Abnormal   Collection Time: 07/05/19  4:36 PM  Result Value Ref Range   Sed Rate 48 (H) 0 - 22 mm/hr  C-reactive protein     Status: Abnormal   Collection Time: 07/05/19  4:36 PM  Result Value Ref Range   CRP 1.0 (H) <1.0 mg/dL  Glucose, capillary     Status: Abnormal   Collection Time: 07/05/19  4:51 PM  Result Value Ref Range   Glucose-Capillary 168 (H) 70 - 99 mg/dL  Glucose, capillary     Status: Abnormal   Collection Time: 07/05/19  9:41 PM  Result Value Ref Range   Glucose-Capillary 264 (H) 70 - 99 mg/dL  CBC with Differential/Platelet     Status: Abnormal   Collection Time: 07/06/19  3:36 AM  Result Value Ref Range   WBC 12.2 (H) 4.0 - 10.5 K/uL   RBC 4.43 3.87 - 5.11 MIL/uL    Hemoglobin 12.5 12.0 - 15.0 g/dL   HCT 39.6 36.0 - 46.0 %   MCV 89.4 80.0 - 100.0 fL   MCH 28.2 26.0 - 34.0 pg   MCHC 31.6 30.0 - 36.0 g/dL   RDW 13.6 11.5 - 15.5 %   Platelets 281 150 - 400 K/uL   nRBC 0.0 0.0 - 0.2 %   Neutrophils Relative % 71 %   Neutro Abs 8.7 (H) 1.7 - 7.7 K/uL   Lymphocytes Relative 22 %   Lymphs Abs 2.7 0.7 - 4.0 K/uL   Monocytes Relative 5 %   Monocytes Absolute 0.7 0.1 - 1.0 K/uL   Eosinophils Relative 1 %   Eosinophils Absolute 0.1 0.0 - 0.5 K/uL   Basophils Relative 0 %   Basophils Absolute  0.1 0.0 - 0.1 K/uL   Immature Granulocytes 1 %   Abs Immature Granulocytes 0.06 0.00 - 0.07 K/uL  Basic metabolic panel     Status: Abnormal   Collection Time: 07/06/19  3:36 AM  Result Value Ref Range   Sodium 133 (L) 135 - 145 mmol/L   Potassium 5.0 3.5 - 5.1 mmol/L   Chloride 102 98 - 111 mmol/L   CO2 24 22 - 32 mmol/L   Glucose, Bld 375 (H) 70 - 99 mg/dL   BUN 25 (H) 6 - 20 mg/dL   Creatinine, Ser 1.25 (H) 0.44 - 1.00 mg/dL   Calcium 8.0 (L) 8.9 - 10.3 mg/dL   GFR calc non Af Amer 49 (L) >60 mL/min   GFR calc Af Amer 57 (L) >60 mL/min   Anion gap 7 5 - 15  Glucose, capillary     Status: Abnormal   Collection Time: 07/06/19  8:02 AM  Result Value Ref Range   Glucose-Capillary 347 (H) 70 - 99 mg/dL    Assessment & Plan:  Patient was evaluated and treated and all questions answered.  Right Foot Ulceration with Cellulitis, Concern for OM; S/p 5th met resection, peroneal tenodesis, wound excision and closure. -Cellulitis proximally resolved. Edema resolved. -Wound VAC on and functioning. Leave intact. -Pending [place] RLE. -Cultures pending. Continue empiric abx -Work with PT. WBAT in boot. Boot ordered. -Work on pain control. She does have high opioid tolerance, takes 68m oxycodone at baseline. -Will provide pain Rx at d/c. -Plan for d/c once Abx plan in place, patient able to WB on extremity safely, pain controlled.   MEvelina Bucy DPM   Accessible via secure chat for questions or concerns.

## 2019-07-06 NOTE — Progress Notes (Signed)
VASCULAR LAB PRELIMINARY  PRELIMINARY  PRELIMINARY  PRELIMINARY  Right lower extremity venous duplex completed.    Preliminary report:  See CV proc for preliminary results.   Jevante Hollibaugh, RVT 07/06/2019, 7:40 PM

## 2019-07-06 NOTE — Evaluation (Signed)
Physical Therapy Evaluation Patient Details Name: Katherine Herrera MRN: [id number] DOB: December 10, 1965 Today's Date: 07/06/2019   History of Present Illness  53 yo female with onset of R foot ulcer and cellulitis was admitted to hosp from her PCP's office.  Pt is now seen with R 5th metatarsal resection, has peroneal tendonesis and placement of a wound vac.  PMHx:   DM, COPD, hypothyroidism, anxiety, tarsal tunnel syndrome, tachycardia, conversion syncope, PN, Gout,  Clinical Impression  Pt is up to side of bed and walked a short trip, but was in pain despite the use of cam boot and her pain meds having been given.  Contacted nursing to see what meds might be available, and nursing is able to bring a choice of meds to pt.  Her plan is ask for CIR to increase safety and balance with RW, and to avoid a higher fall risk given that her home situation is requiring more help.  Pt is motivated to go home, but will recommend rehab to avoid a fall risk and concern for surgery being damaged give that pt reports having hit her feet in walking on many occasions.      Follow Up Recommendations CIR    Equipment Recommendations  Rolling walker with 5" wheels    Recommendations for Other Services       Precautions / Restrictions Precautions Precautions: Fall Precaution Comments: cam boot on at all times Required Braces or Orthoses: Other Brace(cam boot R foot all times) Restrictions Weight Bearing Restrictions: Yes RLE Weight Bearing: Weight bearing as tolerated      Mobility  Bed Mobility Overal bed mobility: Needs Assistance Bed Mobility: Supine to Sit     Supine to sit: Min guard;Min assist     General bed mobility comments: min assist for RLE but otherwise pt can follow instructions well  Transfers Overall transfer level: Needs assistance Equipment used: Rolling walker (2 wheeled);1 person hand held assist Transfers: Sit to/from Stand Sit to Stand: Min guard         General transfer  comment: min guard on RW with RUE being side to assist   Ambulation/Gait Ambulation/Gait assistance: Min guard Gait Distance (Feet): 30 Feet Assistive device: Rolling walker (2 wheeled);1 person hand held assist Gait Pattern/deviations: Step-through pattern;Shuffle;Decreased stride length;Wide base of support Gait velocity:  reduced Gait velocity interpretation: <1.31 ft/sec, indicative of household ambulator General Gait Details: pt was in severe pain by end of first half, slowed pace and reduced WB on RLE  Stairs            Wheelchair Mobility    Modified Rankin (Stroke Patients Only)       Balance Overall balance assessment: Needs assistance Sitting-balance support: Feet supported Sitting balance-Leahy Scale: Fair     Standing balance support: Bilateral upper extremity supported;During functional activity Standing balance-Leahy Scale: Poor Standing balance comment: poor with RW to support RLE                             Pertinent Vitals/Pain Pain Assessment: 0-10 Pain Score: 10-Worst pain ever Pain Location: R foot after walking Pain Descriptors / Indicators: Aching;Operative site guarding Pain Intervention(s): Limited activity within patient's tolerance;Premedicated before session    Home Living Family/patient expects to be discharged to:: Private residence Living Arrangements: Alone Available Help at Discharge: Family;Friend(s);Available PRN/intermittently Type of Home: House Home Access: Ramped entrance     Home Layout: One level Home Equipment: [place] - 4 wheels;Other (  comment)(knee walker)      Prior Function Level of Independence: Independent with assistive device(s)         Comments: has been able to manage with previous equipment     Hand Dominance   Dominant Hand: Right    Extremity/Trunk Assessment   Upper Extremity Assessment Upper Extremity Assessment: Overall WFL for tasks assessed    Lower Extremity  Assessment Lower Extremity Assessment: Generalized weakness    Cervical / Trunk Assessment Cervical / Trunk Assessment: Kyphotic  Communication   Communication: No difficulties  Cognition Arousal/Alertness: Awake/alert Behavior During Therapy: WFL for tasks assessed/performed Overall Cognitive Status: Within Functional Limits for tasks assessed                                 General Comments: pt is in cam boot R foot with minimal pain until walking      General Comments General comments (skin integrity, edema, etc.): pt is in pain after walking and asked nursing to bring meds and look at wound vac     Exercises     Assessment/Plan    PT Assessment Patient needs continued PT services  PT Problem List Decreased strength;Decreased range of motion;Decreased activity tolerance;Decreased balance;Decreased mobility;Decreased coordination;Decreased cognition;Decreased knowledge of use of DME;Decreased safety awareness;Decreased knowledge of precautions;Cardiopulmonary status limiting activity;Obesity;Decreased skin integrity;Pain       PT Treatment Interventions DME instruction;Gait training;Functional mobility training;Therapeutic activities;Therapeutic exercise;Balance training;Neuromuscular re-education;Patient/family education    PT Goals (Current goals can be found in the Care Plan section)  Acute Rehab PT Goals Patient Stated Goal: to walk and to have less pain PT Goal Formulation: With patient Time For Goal Achievement: 07/20/19 Potential to Achieve Goals: Good    Frequency Min 3X/week   Barriers to discharge Inaccessible home environment;Decreased caregiver support gave pt instructions for elevation and resting of RLE in cam boot    Co-evaluation               AM-PAC PT "6 Clicks" Mobility  Outcome Measure Help needed turning from your back to your side while in a flat bed without using bedrails?: A Little Help needed moving from lying on your  back to sitting on the side of a flat bed without using bedrails?: A Little Help needed moving to and from a bed to a chair (including a wheelchair)?: A Little Help needed standing up from a chair using your arms (e.g., wheelchair or bedside chair)?: A Little Help needed to walk in hospital room?: A Lot Help needed climbing 3-5 steps with a railing? : Total 6 Click Score: 15    End of Session Equipment Utilized During Treatment: Gait belt Activity Tolerance: Patient limited by fatigue;Patient limited by pain Patient left: in bed;with call bell/phone within reach;with bed alarm set Nurse Communication: Mobility status PT Visit Diagnosis: Unsteadiness on feet (R26.81);Muscle weakness (generalized) (M62.81);Pain;Adult, failure to thrive (R62.7) Pain - Right/Left: Right Pain - part of body: Ankle and joints of foot    Time: [id number] PT Time Calculation (min) (ACUTE ONLY): 36 min   Charges:   PT Evaluation $PT Eval Moderate Complexity: 1 Mod PT Treatments $Gait Training: 8-22 mins       Ramond Dial 07/06/2019, 4:08 PM   Mee Hives, PT MS Acute Rehab Dept. Number: [place]

## 2019-07-06 NOTE — Progress Notes (Signed)
Orthopedic Tech Progress Note Patient Details:  Katherine Herrera 1966-06-10 [id number]  Patient ID: [name], female   DOB: [birth year], 53 y.o.   MRN: [id number]   [employer] for short cam walker boot. 07/06/2019, 12:14 PM

## 2019-07-07 LAB — CBC WITH DIFFERENTIAL/PLATELET
Abs Immature Granulocytes: 0.04 10*3/uL (ref 0.00–0.07)
Basophils Absolute: 0.1 10*3/uL (ref 0.0–0.1)
Basophils Relative: 1 %
Eosinophils Absolute: 0.3 10*3/uL (ref 0.0–0.5)
Eosinophils Relative: 3 %
HCT: 39.2 % (ref 36.0–46.0)
Hemoglobin: 12.4 g/dL (ref 12.0–15.0)
Immature Granulocytes: 0 %
Lymphocytes Relative: 30 %
Lymphs Abs: 3 10*3/uL (ref 0.7–4.0)
MCH: 28.2 pg (ref 26.0–34.0)
MCHC: 31.6 g/dL (ref 30.0–36.0)
MCV: 89.1 fL (ref 80.0–100.0)
Monocytes Absolute: 0.6 10*3/uL (ref 0.1–1.0)
Monocytes Relative: 6 %
Neutro Abs: 5.9 10*3/uL (ref 1.7–7.7)
Neutrophils Relative %: 60 %
Platelets: 265 10*3/uL (ref 150–400)
RBC: 4.4 MIL/uL (ref 3.87–5.11)
RDW: 14 % (ref 11.5–15.5)
WBC: 9.8 10*3/uL (ref 4.0–10.5)
nRBC: 0 % (ref 0.0–0.2)

## 2019-07-07 LAB — BASIC METABOLIC PANEL
Anion gap: 6 (ref 5–15)
BUN: 19 mg/dL (ref 6–20)
CO2: 25 mmol/L (ref 22–32)
Calcium: 8.1 mg/dL — ABNORMAL LOW (ref 8.9–10.3)
Chloride: 108 mmol/L (ref 98–111)
Creatinine, Ser: 1.09 mg/dL — ABNORMAL HIGH (ref 0.44–1.00)
GFR calc Af Amer: >60 mL/min (ref >60)
GFR calc non Af Amer: 58 mL/min — ABNORMAL LOW (ref >60)
Glucose, Bld: 252 mg/dL — ABNORMAL HIGH (ref 70–99)
Potassium: 4.9 mmol/L (ref 3.5–5.1)
Sodium: 139 mmol/L (ref 135–145)

## 2019-07-07 LAB — GLUCOSE, CAPILLARY
Glucose-Capillary: 158 mg/dL — ABNORMAL HIGH (ref 70–99)
Glucose-Capillary: 201 mg/dL — ABNORMAL HIGH (ref 70–99)
Glucose-Capillary: 211 mg/dL — ABNORMAL HIGH (ref 70–99)
Glucose-Capillary: 234 mg/dL — ABNORMAL HIGH (ref 70–99)

## 2019-07-07 NOTE — Progress Notes (Signed)
Rehab Admissions Coordinator Note:  Patient was screened by Cleatrice Burke for appropriateness for an Inpatient Acute Rehab Consult per PT recs. Pt min guard assist. It is unlikely that [facility] Medicare will approve admission at this high functional level. Pain is barrier to progression.  Cleatrice Burke RN MSN 07/07/2019, 10:11 AM  I can be reached at [phone].

## 2019-07-07 NOTE — Progress Notes (Signed)
Subjective:  Patient ID: KASEE HANTZ, female    DOB: 1966-04-18,  MRN: [id number]  Patient seen at bedside. Worked with PT yesterday. Still having a lot of pain but it is improving. Objective:   Vitals:   07/06/19 2111 07/07/19 0524  BP: 137/66 (!) 112/57  Pulse: 70 65  Resp: 16 16  Temp: 98 F (36.7 C) 98 F (36.7 C)  SpO2: 98% 94%   General AA&O x3. Normal mood and affect.  Vascular Dorsalis pedis and posterior tibial pulses 2/4 bilat. Brisk capillary refill to all digits. Pedal hair present.  Neurologic Epicritic sensation grossly diminished.  Dermatologic Cellulitis resolved. Edema resolved. Dressing intact RLE. [place] on and functioning.  Orthopedic: Positive motor distally.   Results for orders placed or performed during the hospital encounter of 07/04/19  MRSA PCR Screening     Status: None   Collection Time: 07/05/19 12:46 AM   Specimen: Nasopharyngeal  Result Value Ref Range Status   MRSA by PCR NEGATIVE NEGATIVE Final    Comment:        The GeneXpert MRSA Assay (FDA approved for NASAL specimens only), is one component of a comprehensive MRSA colonization surveillance program. It is not intended to diagnose MRSA infection nor to guide or monitor treatment for MRSA infections. Performed at [facility], [place] [address], [place], [place] [address]   SARS CORONAVIRUS 2 (TAT 6-24 HRS) Nasopharyngeal Nasopharyngeal Swab     Status: None   Collection Time: 07/05/19 12:46 AM   Specimen: Nasopharyngeal Swab  Result Value Ref Range Status   SARS Coronavirus 2 NEGATIVE NEGATIVE Final    Comment: (NOTE) SARS-CoV-2 target nucleic acids are NOT DETECTED. The SARS-CoV-2 RNA is generally detectable in upper and lower respiratory specimens during the acute phase of infection. Negative results do not preclude SARS-CoV-2 infection, do not rule out co-infections with other pathogens, and should not be used as the sole basis for treatment or other patient management  decisions. Negative results must be combined with clinical observations, patient history, and epidemiological information. The expected result is Negative. Fact Sheet for Patients: SugarRoll.be Fact Sheet for Healthcare Providers: https://www.woods-mathews.com/ This test is not yet approved or cleared by the [place] FDA and  has been authorized for detection and/or diagnosis of SARS-CoV-2 by FDA under an Emergency Use Authorization (EUA). This EUA will remain  in effect (meaning this test can be used) for the duration of the COVID-19 declaration under Section 56 4(b)(1) of the Act, 21 U.S.C. section 360bbb-3(b)(1), unless the authorization is terminated or revoked sooner. Performed at [facility], [place] [address], [place], [place] [address]   Aerobic/Anaerobic Culture (surgical/deep wound)     Status: None (Preliminary result)   Collection Time: 07/05/19  1:17 PM   Specimen: Foot, Right; Tissue  Result Value Ref Range Status   Specimen Description BONE  Final   Special Requests RIGHT FOOT SAMPLE A METATARSAL CLEAN MARGIN  Final   Gram Stain NO WBC SEEN NO ORGANISMS SEEN   Final   Culture   Final    NO GROWTH < 24 HOURS Performed at [facility], [place] [address], [place], [place] [address]    Report Status PENDING  Incomplete  Aerobic/Anaerobic Culture (surgical/deep wound)     Status: None (Preliminary result)   Collection Time: 07/05/19  1:46 PM   Specimen: Foot, Right; Tissue  Result Value Ref Range Status   Specimen Description FOOT  Final   Special Requests RIGHT  Final   Gram Stain   Final    RARE WBC PRESENT, PREDOMINANTLY PMN NO ORGANISMS SEEN    Culture   Final    NO GROWTH 1 DAY Performed at [place] [address] [place], [place] [address]    Report Status PENDING  Incomplete  Culture, blood (routine x 2)     Status: None (Preliminary result)   Collection Time: 07/05/19  4:36 PM    Specimen: BLOOD RIGHT ARM  Result Value Ref Range Status   Specimen Description BLOOD RIGHT ARM  Final   Special Requests   Final    BOTTLES DRAWN AEROBIC ONLY Blood Culture adequate volume   Culture   Final    NO GROWTH 2 DAYS Performed at [facility], [place] [address], [place], [place] [address]    Report Status PENDING  Incomplete  Culture, blood (routine x 2)     Status: None (Preliminary result)   Collection Time: 07/05/19  4:36 PM   Specimen: BLOOD RIGHT ARM  Result Value Ref Range Status   Specimen Description BLOOD RIGHT ARM  Final   Special Requests   Final    BOTTLES DRAWN AEROBIC ONLY Blood Culture adequate volume   Culture   Final    NO GROWTH 2 DAYS Performed at [facility], [place] [address] [place], [place] [address]    Report Status PENDING  Incomplete   Results for orders placed or performed during the hospital encounter of 07/04/19 (from the past 24 hour(s))  Glucose, capillary     Status: Abnormal   Collection Time: 07/06/19 12:22 PM  Result Value Ref Range   Glucose-Capillary 289 (H) 70 - 99 mg/dL  Glucose, capillary     Status: Abnormal   Collection Time: 07/06/19  4:22 PM  Result Value Ref Range   Glucose-Capillary 198 (H) 70 - 99 mg/dL  Glucose, capillary     Status: Abnormal   Collection Time: 07/06/19  9:14 PM  Result Value Ref Range   Glucose-Capillary 296 (H) 70 - 99 mg/dL  CBC with Differential/Platelet     Status: None   Collection Time: 07/07/19  3:28 AM  Result Value Ref Range   WBC 9.8 4.0 - 10.5 K/uL   RBC 4.40 3.87 - 5.11 MIL/uL   Hemoglobin 12.4 12.0 - 15.0 g/dL   HCT 39.2 36.0 - 46.0 %   MCV 89.1 80.0 - 100.0 fL   MCH 28.2 26.0 - 34.0 pg   MCHC 31.6 30.0 - 36.0 g/dL   RDW 14.0 11.5 - 15.5 %   Platelets 265 150 - 400 K/uL   nRBC 0.0 0.0 - 0.2 %   Neutrophils Relative % 60 %   Neutro Abs 5.9 1.7 - 7.7 K/uL   Lymphocytes Relative 30 %   Lymphs Abs 3.0 0.7 - 4.0 K/uL   Monocytes Relative 6 %   Monocytes Absolute 0.6 0.1 -  1.0 K/uL   Eosinophils Relative 3 %   Eosinophils Absolute 0.3 0.0 - 0.5 K/uL   Basophils Relative 1 %   Basophils Absolute 0.1 0.0 - 0.1 K/uL   Immature Granulocytes 0 %   Abs Immature Granulocytes 0.04 0.00 - 0.07 K/uL  Basic metabolic panel     Status: Abnormal   Collection Time: 07/07/19  3:28 AM  Result Value Ref Range   Sodium 139 135 - 145 mmol/L   Potassium 4.9 3.5 - 5.1 mmol/L   Chloride 108 98 - 111 mmol/L  CO2 25 22 - 32 mmol/L   Glucose, Bld 252 (H) 70 - 99 mg/dL   BUN 19 6 - 20 mg/dL   Creatinine, Ser 1.09 (H) 0.44 - 1.00 mg/dL   Calcium 8.1 (L) 8.9 - 10.3 mg/dL   GFR calc non Af Amer 58 (L) >60 mL/min   GFR calc Af Amer >60 >60 mL/min   Anion gap 6 5 - 15  Glucose, capillary     Status: Abnormal   Collection Time: 07/07/19  8:17 AM  Result Value Ref Range   Glucose-Capillary 158 (H) 70 - 99 mg/dL    Assessment & Plan:  Patient was evaluated and treated and all questions answered.  Right Foot Ulceration with Cellulitis, Concern for OM; S/p 5th met resection, peroneal tenodesis, wound excision and closure. -Edema and cellulitis appear resolved, labs normalized. -Wound VAC on and functioning. Leave intact.  -DVT US Negative R -Cultures pending. Continue empiric abx -Continue PT. -Continue work on pain control. Will provide pain Rx at d/c. -Plan for d/c once Abx plan in place, patient able to WB on extremity safely, pain controlled.   Evelina Bucy, DPM  Accessible via secure chat for questions or concerns.

## 2019-07-07 NOTE — Progress Notes (Signed)
PROGRESS NOTE  Katherine Herrera:[id number] DOB: [birth year] DOA: 07/04/2019 PCP: Cassandria Anger, MD  HPI/Recap of past 24 hours: HPI from Dr Preston Fleeting [name] is a 53 y.o. female with medical history significant of diabetes, hyperlipidemia, hypothyroidism, COPD who was sent over for direct admission by podiatry.  Patient went to see them with right lower extremity diabetic ulcer and swelling. Patient was initiated on ciprofloxacin and clindamycin, without significant improvement. There was concern for possible osteomyelitis and pt was therefore sent over for direct admission for further management as she failed outpatient treatment. Podiatry will follow-up.   Today, patient still complaining of postop pain, although noted morphine helps.  Denies any other new complaints.    Assessment/Plan: Principal Problem:   Diabetic foot ulcer ([place]) Active Problems:   Hypothyroidism   Dyslipidemia   GOUT   Anxiety state   Diabetic neuropathy ([place])   Right foot ulcer ([facility])   Peroneal tendonitis, right  Diabetic R foot ulceration with cellulitis, concern for osteomyelitis of 5th metatarsal R foot s/p fifth metatarsal resection, wound excision and closure, application of wound VAC on 07/05/2019 by podiatry Currently afebrile, with resolved leukocytosis ESR 48, CRP 1 BC X 2 NGTD MRI R foot suspicious for early osteomyelitis Podiatry on board Continue IV vancomycin, ceftriaxone, pending cultures Pain management by podiatry/surgery, bowel regimen Monitor closely  AKI Improving Likely 2/2 to above Vs poor oral intake Daily BMP  Diabetes mellitus type 2 Uncontrolled, A1c 8.1 SSI, novolog, hypoglycemic protocol, accuchecks Started levemir 10U BID, increase to 15U BID  HTN Continue to hold home lisinopril 2/2 to AKI  Hypothyroidism Continue synthroid  Chronic back pain/peripheral neuropathy Continue home baclofen, gabapentin, oxycodone   Anxiety Continue home klonopin prn  Seizure disorder/RLS Continue Lamictal, Requip  Tobacco abuse Continue nicotine patch       Malnutrition Type:      Malnutrition Characteristics:      Nutrition Interventions:       Estimated body mass index is 33.79 kg/m as calculated from the following:   Height as of this encounter: _0  (1.753 m).   Weight as of this encounter: 103.8 kg.     Code Status: Full  Family Communication: None at bedside  Disposition Plan: CIR   Consultants:  Podiatry    Procedures:  Amputation of 5th metatarsal on 07/05/19  Antimicrobials:  Vancomycin  Ceftriaxone   DVT prophylaxis: Lovenox   Objective: Vitals:   07/06/19 2111 07/07/19 0524 07/07/19 1225 07/07/19 1227  BP: 137/66 (!) 112/57 (!) 175/78 (!) 170/78  Pulse: 70 65 62 64  Resp: _1 Temp: 98 F (36.7 C) 98 F (36.7 C) 97.9 F (36.6 C)   TempSrc: Oral Oral Oral   SpO2: 98% 94% 100% 99%  Weight:      Height:        Intake/Output Summary (Last 24 hours) at 07/07/2019 1529 Last data filed at 07/07/2019 1010 Gross per 24 hour  Intake 2751.65 ml  Output 150 ml  Net 2601.65 ml   Filed Weights   07/05/19 1231  Weight: 103.8 kg    Exam:  General: NAD   Cardiovascular: S1, S2 present  Respiratory: CTAB  Abdomen: Soft, nontender, nondistended, bowel sounds present  Musculoskeletal: Trace bilateral pedal edema noted, right foot dressing C/D/I  Skin:  As above  Psychiatry: Normal mood     Data Reviewed: CBC: Recent Labs  Lab 07/04/19 2338 07/05/19 0243 07/06/19 0336 07/07/19 0328  WBC 12.6* 11.9*  12.2* 9.8  NEUTROABS  --   --  8.7* 5.9  HGB 14.7 12.6 12.5 12.4  HCT 44.8 39.1 39.6 39.2  MCV 87.3 87.9 89.4 89.1  PLT 321 298 281 681   Basic Metabolic Panel: Recent Labs  Lab 07/04/19 2338 07/05/19 0243 07/06/19 0336 07/07/19 0328  NA  --  136 133* 139  K  --  4.2 5.0 4.9  CL  --  104 102 108  CO2  --  _0 GLUCOSE  --  241* 375* 252*  BUN  --  24* 25* 19   CREATININE 1.31* 1.26* 1.25* 1.09*  CALCIUM  --  8.1* 8.0* 8.1*   GFR: Estimated Creatinine Clearance: 76.5 mL/min (A) (by C-G formula based on SCr of 1.09 mg/dL (H)). Liver Function Tests: Recent Labs  Lab 07/05/19 0243  AST 16  ALT 13  ALKPHOS 94  BILITOT 0.4  PROT 6.4*  ALBUMIN 2.8*   No results for input(s): LIPASE, AMYLASE in the last 168 hours. No results for input(s): AMMONIA in the last 168 hours. Coagulation Profile: No results for input(s): INR, PROTIME in the last 168 hours. Cardiac Enzymes: No results for input(s): CKTOTAL, CKMB, CKMBINDEX, TROPONINI in the last 168 hours. BNP (last 3 results) No results for input(s): PROBNP in the last 8760 hours. HbA1C: Recent Labs    07/04/19 2338  HGBA1C 8.1*   CBG: Recent Labs  Lab 07/06/19 1222 07/06/19 1622 07/06/19 2114 07/07/19 0817 07/07/19 1221  GLUCAP 289* 198* 296* 158* 201*   Lipid Profile: No results for input(s): CHOL, HDL, LDLCALC, TRIG, CHOLHDL, LDLDIRECT in the last 72 hours. Thyroid Function Tests: No results for input(s): TSH, T4TOTAL, FREET4, T3FREE, THYROIDAB in the last 72 hours. Anemia Panel: No results for input(s): VITAMINB12, FOLATE, FERRITIN, TIBC, IRON, RETICCTPCT in the last 72 hours. Urine analysis:    Component Value Date/Time   COLORURINE YELLOW 02/20/2019 1330   APPEARANCEUR CLEAR 02/20/2019 1330   LABSPEC 1.010 02/20/2019 1330   PHURINE 5.5 02/20/2019 1330   GLUCOSEU NEGATIVE 02/20/2019 1330   HGBUR NEGATIVE 02/20/2019 1330   BILIRUBINUR NEGATIVE 02/20/2019 1330   KETONESUR NEGATIVE 02/20/2019 1330   PROTEINUR NEGATIVE 09/28/2018 2318   UROBILINOGEN 0.2 02/20/2019 1330   NITRITE NEGATIVE 02/20/2019 1330   LEUKOCYTESUR NEGATIVE 02/20/2019 1330   Sepsis Labs: _1 (procalcitonin:4,lacticidven:4)  ) Recent Results (from the past 240 hour(s))  MRSA PCR Screening     Status: None   Collection Time: 07/05/19 12:46 AM   Specimen: Nasopharyngeal  Result Value Ref Range  Status   MRSA by PCR NEGATIVE NEGATIVE Final    Comment:        The GeneXpert MRSA Assay (FDA approved for NASAL specimens only), is one component of a comprehensive MRSA colonization surveillance program. It is not intended to diagnose MRSA infection nor to guide or monitor treatment for MRSA infections. Performed at [facility], [place] [address], [place], [place] [address]   SARS CORONAVIRUS 2 (TAT 6-24 HRS) Nasopharyngeal Nasopharyngeal Swab     Status: None   Collection Time: 07/05/19 12:46 AM   Specimen: Nasopharyngeal Swab  Result Value Ref Range Status   SARS Coronavirus 2 NEGATIVE NEGATIVE Final    Comment: (NOTE) SARS-CoV-2 target nucleic acids are NOT DETECTED. The SARS-CoV-2 RNA is generally detectable in upper and lower respiratory specimens during the acute phase of infection. Negative results do not preclude SARS-CoV-2 infection, do not rule out co-infections with other pathogens, and should not be used as  the sole basis for treatment or other patient management decisions. Negative results must be combined with clinical observations, patient history, and epidemiological information. The expected result is Negative. Fact Sheet for Patients: SugarRoll.be Fact Sheet for Healthcare Providers: https://www.woods-mathews.com/ This test is not yet approved or cleared by the [place] FDA and  has been authorized for detection and/or diagnosis of SARS-CoV-2 by FDA under an Emergency Use Authorization (EUA). This EUA will remain  in effect (meaning this test can be used) for the duration of the COVID-19 declaration under Section 56 4(b)(1) of the Act, 21 U.S.C. section 360bbb-3(b)(1), unless the authorization is terminated or revoked sooner. Performed at [facility], [place] [address] [place], [place] [address]   Aerobic/Anaerobic Culture (surgical/deep wound)     Status: None (Preliminary result)   Collection  Time: 07/05/19  1:17 PM   Specimen: Foot, Right; Tissue  Result Value Ref Range Status   Specimen Description BONE  Final   Special Requests RIGHT FOOT SAMPLE A METATARSAL CLEAN MARGIN  Final   Gram Stain NO WBC SEEN NO ORGANISMS SEEN   Final   Culture   Final    NO GROWTH 2 DAYS NO ANAEROBES ISOLATED; CULTURE IN PROGRESS FOR 5 DAYS Performed at [facility], [place] [address], [place], [place] [address]    Report Status PENDING  Incomplete  Aerobic/Anaerobic Culture (surgical/deep wound)     Status: None (Preliminary result)   Collection Time: 07/05/19  1:46 PM   Specimen: Foot, Right; Tissue  Result Value Ref Range Status   Specimen Description FOOT  Final   Special Requests RIGHT  Final   Gram Stain   Final    RARE WBC PRESENT, PREDOMINANTLY PMN NO ORGANISMS SEEN Performed at [facility], [place] [address] [place], [place] [address]    Culture   Final    RARE NORMAL SKIN FLORA NO ANAEROBES ISOLATED; CULTURE IN PROGRESS FOR 5 DAYS    Report Status PENDING  Incomplete  Culture, blood (routine x 2)     Status: None (Preliminary result)   Collection Time: 07/05/19  4:36 PM   Specimen: BLOOD RIGHT ARM  Result Value Ref Range Status   Specimen Description BLOOD RIGHT ARM  Final   Special Requests   Final    BOTTLES DRAWN AEROBIC ONLY Blood Culture adequate volume   Culture   Final    NO GROWTH 2 DAYS Performed at [facility], [place] [address], [place], [place] [address]    Report Status PENDING  Incomplete  Culture, blood (routine x 2)     Status: None (Preliminary result)   Collection Time: 07/05/19  4:36 PM   Specimen: BLOOD RIGHT ARM  Result Value Ref Range Status   Specimen Description BLOOD RIGHT ARM  Final   Special Requests   Final    BOTTLES DRAWN AEROBIC ONLY Blood Culture adequate volume   Culture   Final    NO GROWTH 2 DAYS Performed at [facility], [place] [address] [place], [place] [address]    Report Status PENDING  Incomplete       Studies: No results found.  Scheduled Meds: . aspirin  325 mg Oral Daily  . baclofen  20 mg Oral BID  . enoxaparin (LOVENOX) injection  40 mg Subcutaneous Q24H  . gabapentin  800 mg Oral QID  . insulin aspart  0-20 Units Subcutaneous TID WC  . insulin aspart  0-5 Units Subcutaneous QHS  .  insulin detemir  15 Units Subcutaneous BID  . lamoTRIgine  25 mg Oral BID  . levothyroxine  75 mcg Oral Q0600  . nicotine  21 mg Transdermal Daily  . polyethylene glycol  17 g Oral BID  . rOPINIRole  0.5 mg Oral QHS  . senna-docusate  1 tablet Oral BID  . sodium chloride flush  10-40 mL Intracatheter Q12H    Continuous Infusions: . sodium chloride 100 mL/hr at 07/07/19 0604  . cefTRIAXone (ROCEPHIN)  IV 2 g (07/07/19 0828)  . lactated ringers 10 mL/hr at 07/05/19 1236  . vancomycin 1,250 mg (07/06/19 2351)     LOS: 3 days     Alma Friendly, MD [facility]  If 7PM-7AM, please contact night-coverage [email or web address] 07/07/2019, 3:29 PM

## 2019-07-08 LAB — CBC WITH DIFFERENTIAL/PLATELET
Abs Immature Granulocytes: 0.03 10*3/uL (ref 0.00–0.07)
Basophils Absolute: 0.1 10*3/uL (ref 0.0–0.1)
Basophils Relative: 1 %
Eosinophils Absolute: 0.3 10*3/uL (ref 0.0–0.5)
Eosinophils Relative: 3 %
HCT: 40.1 % (ref 36.0–46.0)
Hemoglobin: 12.7 g/dL (ref 12.0–15.0)
Immature Granulocytes: 0 %
Lymphocytes Relative: 25 %
Lymphs Abs: 2.7 10*3/uL (ref 0.7–4.0)
MCH: 28.1 pg (ref 26.0–34.0)
MCHC: 31.7 g/dL (ref 30.0–36.0)
MCV: 88.7 fL (ref 80.0–100.0)
Monocytes Absolute: 0.8 10*3/uL (ref 0.1–1.0)
Monocytes Relative: 7 %
Neutro Abs: 7 10*3/uL (ref 1.7–7.7)
Neutrophils Relative %: 64 %
Platelets: 283 10*3/uL (ref 150–400)
RBC: 4.52 MIL/uL (ref 3.87–5.11)
RDW: 13.5 % (ref 11.5–15.5)
WBC: 10.9 10*3/uL — ABNORMAL HIGH (ref 4.0–10.5)
nRBC: 0 % (ref 0.0–0.2)

## 2019-07-08 LAB — BASIC METABOLIC PANEL
Anion gap: 6 (ref 5–15)
BUN: 14 mg/dL (ref 6–20)
CO2: 26 mmol/L (ref 22–32)
Calcium: 8.4 mg/dL — ABNORMAL LOW (ref 8.9–10.3)
Chloride: 105 mmol/L (ref 98–111)
Creatinine, Ser: 0.99 mg/dL (ref 0.44–1.00)
GFR calc Af Amer: >60 mL/min (ref >60)
GFR calc non Af Amer: >60 mL/min (ref >60)
Glucose, Bld: 156 mg/dL — ABNORMAL HIGH (ref 70–99)
Potassium: 5 mmol/L (ref 3.5–5.1)
Sodium: 137 mmol/L (ref 135–145)

## 2019-07-08 LAB — GLUCOSE, CAPILLARY
Glucose-Capillary: 156 mg/dL — ABNORMAL HIGH (ref 70–99)
Glucose-Capillary: 211 mg/dL — ABNORMAL HIGH (ref 70–99)
Glucose-Capillary: 200 mg/dL — ABNORMAL HIGH (ref 70–99)
Glucose-Capillary: 253 mg/dL — ABNORMAL HIGH (ref 70–99)

## 2019-07-08 MED ORDER — HYDROMORPHONE HCL 2 MG PO TABS
4.0000 mg | ORAL_TABLET | Freq: Four times a day (QID) | ORAL | Status: DC | PRN
  Administered 2019-07-08 – 2019-07-09 (×4): 4 mg via ORAL
  Filled 2019-07-08 (×4): qty 2

## 2019-07-08 MED ORDER — VANCOMYCIN HCL 10 G IV SOLR
1500.0000 mg | INTRAVENOUS | Status: DC
  Administered 2019-07-08 – 2019-07-10 (×2): 1500 mg via INTRAVENOUS
  Filled 2019-07-08 (×3): qty 1500

## 2019-07-08 NOTE — Care Management Important Message (Signed)
Important Message  Patient Details  Name: Katherine Herrera MRN: [id number] Date of Birth: 06-26-1966   Medicare Important Message Given:  Yes     [name] [address] 07/08/2019, 3:58 PM

## 2019-07-08 NOTE — Progress Notes (Signed)
PROGRESS NOTE  Katherine Herrera:[id number] DOB: 12-12-1965 DOA: 07/04/2019 PCP: Cassandria Anger, MD  HPI/Recap of past 24 hours: HPI from Dr Preston Fleeting Katherine Herrera is a 53 y.o. female with medical history significant of diabetes, hyperlipidemia, hypothyroidism, COPD who was sent over for direct admission by podiatry.  Patient went to see them with right lower extremity diabetic ulcer and swelling. Patient was initiated on ciprofloxacin and clindamycin, without significant improvement. There was concern for possible osteomyelitis and pt was therefore sent over for direct admission for further management as she failed outpatient treatment. Podiatry will follow-up.    Today, patient denies any new complaints other than postop pain, worse with weightbearing.  Denies any chest pain, shortness of breath, abdominal pain, nausea/vomiting, fever/chills.   Assessment/Plan: Principal Problem:   Diabetic foot ulcer ([place]) Active Problems:   Hypothyroidism   Dyslipidemia   GOUT   Anxiety state   Diabetic neuropathy (H[facility])   Right foot ulcer ([facility]CC)   Peroneal tendonitis, right  Diabetic R foot ulceration with cellulitis, concern for osteomyelitis of 5th metatarsal R foot s/p fifth metatarsal resection, wound excision and closure, application of wound VAC on 07/05/2019 by podiatry Currently afebrile, with fluctuating leukocytosis ESR 48, CRP 1 BC X 2 NGTD MRI R foot suspicious for early osteomyelitis Podiatry on board Bone culture showing NGTD, deep tissue culture showed normal skin flora, awaiting final cultures Continue IV vancomycin, ceftriaxone, pending cultures Pain management by podiatry/surgery, bowel regimen Monitor closely  AKI Improving Likely 2/2 to above Daily BMP  Diabetes mellitus type 2 Uncontrolled, A1c 8.1 SSI, novolog, hypoglycemic protocol, accuchecks Continue Levemir 15U BID  HTN Stable Continue to hold home lisinopril 2/2 to AKI  Hypothyroidism Continue  synthroid  Chronic back pain/peripheral neuropathy Continue home baclofen, gabapentin, oxycodone   Anxiety Continue home klonopin prn  Seizure disorder/RLS Continue Lamictal, Requip  Tobacco abuse Continue nicotine patch       Malnutrition Type:      Malnutrition Characteristics:      Nutrition Interventions:       Estimated body mass index is 33.79 kg/m as calculated from the following:   Height as of this encounter: 5' 9" (1.753 m).   Weight as of this encounter: 103.8 kg.     Code Status: Full  Family Communication: None at bedside  Disposition Plan: To be determined   Consultants:  Podiatry    Procedures:  Amputation of 5th metatarsal on 07/05/19  Antimicrobials:  Vancomycin  Ceftriaxone   DVT prophylaxis: Lovenox   Objective: Vitals:   07/07/19 1227 07/07/19 2113 07/08/19 0605 07/08/19 1249  BP: (!) 170/78 (!) 161/74 134/62 131/73  Pulse: 64 79 72 65  Resp:  _0 Temp:  98.7 F (37.1 C) 97.6 F (36.4 C) 98 F (36.7 C)  TempSrc:      SpO2: 99% 98% 98% 95%  Weight:      Height:        Intake/Output Summary (Last 24 hours) at 07/08/2019 1412 Last data filed at 07/08/2019 0900 Gross per 24 hour  Intake 1571.45 ml  Output 300 ml  Net 1271.45 ml   Filed Weights   07/05/19 1231  Weight: 103.8 kg    Exam:  General: NAD   Cardiovascular: S1, S2 present  Respiratory: CTAB  Abdomen: Soft, nontender, nondistended, bowel sounds present  Musculoskeletal: Trace bilateral pedal edema noted, right foot dressing C/D/I  Skin: Normal  Psychiatry: Normal mood     Data Reviewed: CBC: Recent  Labs  Lab 07/04/19 2338 07/05/19 0243 07/06/19 0336 07/07/19 0328 07/08/19 0353  WBC 12.6* 11.9* 12.2* 9.8 10.9*  NEUTROABS  --   --  8.7* 5.9 7.0  HGB 14.7 12.6 12.5 12.4 12.7  HCT 44.8 39.1 39.6 39.2 40.1  MCV 87.3 87.9 89.4 89.1 88.7  PLT 321 298 281 265 549   Basic Metabolic Panel: Recent Labs  Lab 07/04/19 2338  07/05/19 0243 07/06/19 0336 07/07/19 0328 07/08/19 0353  NA  --  136 133* 139 137  K  --  4.2 5.0 4.9 5.0  CL  --  104 102 108 105  CO2  --  _0 GLUCOSE  --  241* 375* 252* 156*  BUN  --  24* 25* 19 14  CREATININE 1.31* 1.26* 1.25* 1.09* 0.99  CALCIUM  --  8.1* 8.0* 8.1* 8.4*   GFR: Estimated Creatinine Clearance: 84.2 mL/min (by C-G formula based on SCr of 0.99 mg/dL). Liver Function Tests: Recent Labs  Lab 07/05/19 0243  AST 16  ALT 13  ALKPHOS 94  BILITOT 0.4  PROT 6.4*  ALBUMIN 2.8*   No results for input(s): LIPASE, AMYLASE in the last 168 hours. No results for input(s): AMMONIA in the last 168 hours. Coagulation Profile: No results for input(s): INR, PROTIME in the last 168 hours. Cardiac Enzymes: No results for input(s): CKTOTAL, CKMB, CKMBINDEX, TROPONINI in the last 168 hours. BNP (last 3 results) No results for input(s): PROBNP in the last 8760 hours. HbA1C: No results for input(s): HGBA1C in the last 72 hours. CBG: Recent Labs  Lab 07/07/19 1221 07/07/19 1645 07/07/19 2115 07/08/19 0740 07/08/19 1318  GLUCAP 201* 211* 234* 156* 211*   Lipid Profile: No results for input(s): CHOL, HDL, LDLCALC, TRIG, CHOLHDL, LDLDIRECT in the last 72 hours. Thyroid Function Tests: No results for input(s): TSH, T4TOTAL, FREET4, T3FREE, THYROIDAB in the last 72 hours. Anemia Panel: No results for input(s): VITAMINB12, FOLATE, FERRITIN, TIBC, IRON, RETICCTPCT in the last 72 hours. Urine analysis:    Component Value Date/Time   COLORURINE YELLOW 02/20/2019 1330   APPEARANCEUR CLEAR 02/20/2019 1330   LABSPEC 1.010 02/20/2019 1330   PHURINE 5.5 02/20/2019 1330   GLUCOSEU NEGATIVE 02/20/2019 1330   HGBUR NEGATIVE 02/20/2019 1330   BILIRUBINUR NEGATIVE 02/20/2019 1330   KETONESUR NEGATIVE 02/20/2019 1330   PROTEINUR NEGATIVE 09/28/2018 2318   UROBILINOGEN 0.2 02/20/2019 1330   NITRITE NEGATIVE 02/20/2019 1330   LEUKOCYTESUR NEGATIVE 02/20/2019 1330    Sepsis Labs: _1 (procalcitonin:4,lacticidven:4)  ) Recent Results (from the past 240 hour(s))  MRSA PCR Screening     Status: None   Collection Time: 07/05/19 12:46 AM   Specimen: Nasopharyngeal  Result Value Ref Range Status   MRSA by PCR NEGATIVE NEGATIVE Final    Comment:        The GeneXpert MRSA Assay (FDA approved for NASAL specimens only), is one component of a comprehensive MRSA colonization surveillance program. It is not intended to diagnose MRSA infection nor to guide or monitor treatment for MRSA infections. Performed at [facility], [place] [address] [place], [place] [address]   SARS CORONAVIRUS 2 (TAT 6-24 HRS) Nasopharyngeal Nasopharyngeal Swab     Status: None   Collection Time: 07/05/19 12:46 AM   Specimen: Nasopharyngeal Swab  Result Value Ref Range Status   SARS Coronavirus 2 NEGATIVE NEGATIVE Final    Comment: (NOTE) SARS-CoV-2 target nucleic acids are NOT DETECTED. The SARS-CoV-2 RNA is generally detectable in upper and lower  respiratory specimens during the acute phase of infection. Negative results do not preclude SARS-CoV-2 infection, do not rule out co-infections with other pathogens, and should not be used as the sole basis for treatment or other patient management decisions. Negative results must be combined with clinical observations, patient history, and epidemiological information. The expected result is Negative. Fact Sheet for Patients: SugarRoll.be Fact Sheet for Healthcare Providers: https://www.woods-mathews.com/ This test is not yet approved or cleared by the [place] FDA and  has been authorized for detection and/or diagnosis of SARS-CoV-2 by FDA under an Emergency Use Authorization (EUA). This EUA will remain  in effect (meaning this test can be used) for the duration of the COVID-19 declaration under Section 56 4(b)(1) of the Act, 21 U.S.C. section 360bbb-3(b)(1), unless the  authorization is terminated or revoked sooner. Performed at [facility], [place] [address] [place], [place] [address]   Aerobic/Anaerobic Culture (surgical/deep wound)     Status: None (Preliminary result)   Collection Time: 07/05/19  1:17 PM   Specimen: Foot, Right; Tissue  Result Value Ref Range Status   Specimen Description BONE  Final   Special Requests RIGHT FOOT SAMPLE A METATARSAL CLEAN MARGIN  Final   Gram Stain NO WBC SEEN NO ORGANISMS SEEN   Final   Culture   Final    NO GROWTH 3 DAYS NO ANAEROBES ISOLATED; CULTURE IN PROGRESS FOR 5 DAYS Performed at [facility], [place] [address], [place], [place] [address]    Report Status PENDING  Incomplete  Aerobic/Anaerobic Culture (surgical/deep wound)     Status: None (Preliminary result)   Collection Time: 07/05/19  1:46 PM   Specimen: Foot, Right; Tissue  Result Value Ref Range Status   Specimen Description FOOT  Final   Special Requests RIGHT  Final   Gram Stain   Final    RARE WBC PRESENT, PREDOMINANTLY PMN NO ORGANISMS SEEN Performed at [facility], [place] [address], [place], [place] [address]    Culture   Final    RARE NORMAL SKIN FLORA NO ANAEROBES ISOLATED; CULTURE IN PROGRESS FOR 5 DAYS    Report Status PENDING  Incomplete  Culture, blood (routine x 2)     Status: None (Preliminary result)   Collection Time: 07/05/19  4:36 PM   Specimen: BLOOD RIGHT ARM  Result Value Ref Range Status   Specimen Description BLOOD RIGHT ARM  Final   Special Requests   Final    BOTTLES DRAWN AEROBIC ONLY Blood Culture adequate volume   Culture   Final    NO GROWTH 3 DAYS Performed at [facility], [place] [address] [place], [place] [address]    Report Status PENDING  Incomplete  Culture, blood (routine x 2)     Status: None (Preliminary result)   Collection Time: 07/05/19  4:36 PM   Specimen: BLOOD RIGHT ARM  Result Value Ref Range Status   Specimen Description BLOOD RIGHT ARM  Final   Special Requests    Final    BOTTLES DRAWN AEROBIC ONLY Blood Culture adequate volume   Culture   Final    NO GROWTH 3 DAYS Performed at [facility], [place] [address] [place], [place] [address]    Report Status PENDING  Incomplete      Studies: No results found.  Scheduled Meds: . aspirin  325 mg Oral Daily  . baclofen  20 mg Oral BID  . enoxaparin (LOVENOX) injection  40 mg Subcutaneous Q24H  .  gabapentin  800 mg Oral QID  . insulin aspart  0-20 Units Subcutaneous TID WC  . insulin aspart  0-5 Units Subcutaneous QHS  . insulin detemir  15 Units Subcutaneous BID  . lamoTRIgine  25 mg Oral BID  . levothyroxine  75 mcg Oral Q0600  . nicotine  21 mg Transdermal Daily  . polyethylene glycol  17 g Oral BID  . rOPINIRole  0.5 mg Oral QHS  . senna-docusate  1 tablet Oral BID  . sodium chloride flush  10-40 mL Intracatheter Q12H    Continuous Infusions: . cefTRIAXone (ROCEPHIN)  IV 2 g (07/08/19 0851)  . lactated ringers 10 mL/hr at 07/05/19 1236  . vancomycin       LOS: 4 days     Alma Friendly, MD [facility]  If 7PM-7AM, please contact night-coverage [email or web address] 07/08/2019, 2:12 PM

## 2019-07-08 NOTE — Progress Notes (Signed)
Pharmacy Antibiotic Note  Katherine Herrera is a 53 y.o. female admitted on 07/04/2019 with right foot ulcer and cellulitis, concern for osteomyelitis. S/p 5th metatarsal resection/excision on 9/4. Pharmacy has been consulted for vancomycin dosing.   Currently on vancomycin 1250mg  IV q24h. Scr is downtrending from 1.09 to 0.99. Recalculated based on current regimen of vancomycin 1250mg  IV q24h and current Scr of 0.99 produces a subtherapetuic expected AUC of 392.2 (goal 400-550). Will adjust vancomycin regimen based on current Scr. Afebrile. WBC 10.9.   Vancomycin 1500 mg IV Q 24 hrs. Goal AUC 400-550. Expected AUC: 470.7 SCr used: 0.99 Vd used: 0.5  Plan: Change vancomycin 1250mg  IV q24h to vancomycin 1500mg  IV q24h  Monitor serum creatinine and clinical progression  Obtain vancomycin levels at steady state   Height: 5\' 9"  (175.3 cm) Weight: 228 lb 13.4 oz (103.8 kg) IBW/kg (Calculated) : 66.2  Temp (24hrs), Avg:98.1 F (36.7 C), Min:97.6 F (36.4 C), Max:98.7 F (37.1 C)  Recent Labs  Lab 07/04/19 2338 07/05/19 0243 07/06/19 0336 07/07/19 0328 07/08/19 0353  WBC 12.6* 11.9* 12.2* 9.8 10.9*  CREATININE 1.31* 1.26* 1.25* 1.09* 0.99    Estimated Creatinine Clearance: 84.2 mL/min (by C-G formula based on SCr of 0.99 mg/dL).    Allergies  Allergen Reactions  . Hydromorphone Hcl Itching  . Cephalexin Nausea And Vomiting  . Demerol  [Meperidine Hcl] Rash  . Lovastatin Other (See Comments)    Other reaction(s): Unknown (See Comments) Possible myalgia Unknown   . Metformin And Related Nausea And Vomiting  . Sulfamethoxazole-Trimethoprim     Other reaction(s): Unknown DILI, pancreatitis  . Crestor [Rosuvastatin Calcium]     myalgia  . Niacin Other (See Comments)    Unknown   . Paroxetine Other (See Comments)    Other reaction(s): Unknown (See Comments) unknown Unknown     Antimicrobials this admission: Vancomycin 9/4>> Ceftriaxone 9/4>> Clindamycin x 1 pre-op on  9/4  Dose adjustments this admission: 9/4: Increase Ceftriaxone from 1 to 2gm IV q24h 9/7: Change vancomycin 1250mg  IV q24h to 1500mg  IV q24h  Microbiology results:  9/4 Bcx: ngtd 9/4 right foot tissue: ngtd 9/4 MRSA PCR: negative    Thank you for allowing pharmacy to be a part of this patient's care.  Cristela Felt, PharmD PGY1 Pharmacy Resident [employer]: [phone]  07/08/2019 11:08 AM

## 2019-07-08 NOTE — Progress Notes (Signed)
Subjective:  Patient ID: Katherine Herrera, female    DOB: December 12, 1965,  MRN: [id number]  Patient seen at bedside. Did not work with PT yesterday. Still having a lot of pain to the RLE especially with WB. Objective:   Vitals:   07/07/19 2113 07/08/19 0605  BP: (!) 161/74 134/62  Pulse: 79 72  Resp: 16 16  Temp: 98.7 F (37.1 C) 97.6 F (36.4 C)  SpO2: 98% 98%   General AA&O x3. Normal mood and affect.  Vascular Dorsalis pedis and posterior tibial pulses 2/4 bilat. Brisk capillary refill to all digits. Pedal hair present.  Neurologic Epicritic sensation grossly diminished.  Dermatologic Cellulitis resolved. Edema resolved. Dressing intact RLE. [place] on and functioning.  Orthopedic: Positive motor distally.   Results for orders placed or performed during the hospital encounter of 07/04/19  MRSA PCR Screening     Status: None   Collection Time: 07/05/19 12:46 AM   Specimen: Nasopharyngeal  Result Value Ref Range Status   MRSA by PCR NEGATIVE NEGATIVE Final    Comment:        The GeneXpert MRSA Assay (FDA approved for NASAL specimens only), is one component of a comprehensive MRSA colonization surveillance program. It is not intended to diagnose MRSA infection nor to guide or monitor treatment for MRSA infections. Performed at [facility], [place] [address] [place], [place] [address]   SARS CORONAVIRUS 2 (TAT 6-24 HRS) Nasopharyngeal Nasopharyngeal Swab     Status: None   Collection Time: 07/05/19 12:46 AM   Specimen: Nasopharyngeal Swab  Result Value Ref Range Status   SARS Coronavirus 2 NEGATIVE NEGATIVE Final    Comment: (NOTE) SARS-CoV-2 target nucleic acids are NOT DETECTED. The SARS-CoV-2 RNA is generally detectable in upper and lower respiratory specimens during the acute phase of infection. Negative results do not preclude SARS-CoV-2 infection, do not rule out co-infections with other pathogens, and should not be used as the sole basis for treatment or other  patient management decisions. Negative results must be combined with clinical observations, patient history, and epidemiological information. The expected result is Negative. Fact Sheet for Patients: SugarRoll.be Fact Sheet for Healthcare Providers: https://www.woods-mathews.com/ This test is not yet approved or cleared by the [place] FDA and  has been authorized for detection and/or diagnosis of SARS-CoV-2 by FDA under an Emergency Use Authorization (EUA). This EUA will remain  in effect (meaning this test can be used) for the duration of the COVID-19 declaration under Section 56 4(b)(1) of the Act, 21 U.S.C. section 360bbb-3(b)(1), unless the authorization is terminated or revoked sooner. Performed at [facility], [place] [address] [place], [place] [address]   Aerobic/Anaerobic Culture (surgical/deep wound)     Status: None (Preliminary result)   Collection Time: 07/05/19  1:17 PM   Specimen: Foot, Right; Tissue  Result Value Ref Range Status   Specimen Description BONE  Final   Special Requests RIGHT FOOT SAMPLE A METATARSAL CLEAN MARGIN  Final   Gram Stain NO WBC SEEN NO ORGANISMS SEEN   Final   Culture   Final    NO GROWTH 2 DAYS NO ANAEROBES ISOLATED; CULTURE IN PROGRESS FOR 5 DAYS Performed at [facility], [place] [address], [place], [place] [address]    Report Status PENDING  Incomplete  Aerobic/Anaerobic Culture (surgical/deep wound)     Status: None (Preliminary result)   Collection Time: 07/05/19  1:46 PM   Specimen: Foot, Right; Tissue  Result Value Ref Range Status  Specimen Description FOOT  Final   Special Requests RIGHT  Final   Gram Stain   Final    RARE WBC PRESENT, PREDOMINANTLY PMN NO ORGANISMS SEEN Performed at [facility], [address], [place], [place] [address]    Culture   Final    RARE NORMAL SKIN FLORA NO ANAEROBES ISOLATED; CULTURE IN PROGRESS FOR 5 DAYS    Report Status  PENDING  Incomplete  Culture, blood (routine x 2)     Status: None (Preliminary result)   Collection Time: 07/05/19  4:36 PM   Specimen: BLOOD RIGHT ARM  Result Value Ref Range Status   Specimen Description BLOOD RIGHT ARM  Final   Special Requests   Final    BOTTLES DRAWN AEROBIC ONLY Blood Culture adequate volume   Culture   Final    NO GROWTH 3 DAYS Performed at [facility], [place] [address] [place], [place] [address]    Report Status PENDING  Incomplete  Culture, blood (routine x 2)     Status: None (Preliminary result)   Collection Time: 07/05/19  4:36 PM   Specimen: BLOOD RIGHT ARM  Result Value Ref Range Status   Specimen Description BLOOD RIGHT ARM  Final   Special Requests   Final    BOTTLES DRAWN AEROBIC ONLY Blood Culture adequate volume   Culture   Final    NO GROWTH 3 DAYS Performed at [facility], [place] [address], [place], [place] [address]    Report Status PENDING  Incomplete   Results for orders placed or performed during the hospital encounter of 07/04/19 (from the past 24 hour(s))  Glucose, capillary     Status: Abnormal   Collection Time: 07/07/19 12:21 PM  Result Value Ref Range   Glucose-Capillary 201 (H) 70 - 99 mg/dL  Glucose, capillary     Status: Abnormal   Collection Time: 07/07/19  4:45 PM  Result Value Ref Range   Glucose-Capillary 211 (H) 70 - 99 mg/dL  Glucose, capillary     Status: Abnormal   Collection Time: 07/07/19  9:15 PM  Result Value Ref Range   Glucose-Capillary 234 (H) 70 - 99 mg/dL  CBC with Differential/Platelet     Status: Abnormal   Collection Time: 07/08/19  3:53 AM  Result Value Ref Range   WBC 10.9 (H) 4.0 - 10.5 K/uL   RBC 4.52 3.87 - 5.11 MIL/uL   Hemoglobin 12.7 12.0 - 15.0 g/dL   HCT 40.1 36.0 - 46.0 %   MCV 88.7 80.0 - 100.0 fL   MCH 28.1 26.0 - 34.0 pg   MCHC 31.7 30.0 - 36.0 g/dL   RDW 13.5 11.5 - 15.5 %   Platelets 283 150 - 400 K/uL   nRBC 0.0 0.0 - 0.2 %   Neutrophils Relative % 64 %   Neutro Abs  7.0 1.7 - 7.7 K/uL   Lymphocytes Relative 25 %   Lymphs Abs 2.7 0.7 - 4.0 K/uL   Monocytes Relative 7 %   Monocytes Absolute 0.8 0.1 - 1.0 K/uL   Eosinophils Relative 3 %   Eosinophils Absolute 0.3 0.0 - 0.5 K/uL   Basophils Relative 1 %   Basophils Absolute 0.1 0.0 - 0.1 K/uL   Immature Granulocytes 0 %   Abs Immature Granulocytes 0.03 0.00 - 0.07 K/uL  Basic metabolic panel     Status: Abnormal   Collection Time: 07/08/19  3:53 AM  Result Value Ref Range   Sodium 137  135 - 145 mmol/L   Potassium 5.0 3.5 - 5.1 mmol/L   Chloride 105 98 - 111 mmol/L   CO2 26 22 - 32 mmol/L   Glucose, Bld 156 (H) 70 - 99 mg/dL   BUN 14 6 - 20 mg/dL   Creatinine, Ser 0.99 0.44 - 1.00 mg/dL   Calcium 8.4 (L) 8.9 - 10.3 mg/dL   GFR calc non Af Amer >60 >60 mL/min   GFR calc Af Amer >60 >60 mL/min   Anion gap 6 5 - 15  Glucose, capillary     Status: Abnormal   Collection Time: 07/08/19  7:40 AM  Result Value Ref Range   Glucose-Capillary 156 (H) 70 - 99 mg/dL    Assessment & Plan:  Patient was evaluated and treated and all questions answered.  Right Foot Ulceration with Cellulitis, Concern for OM; S/p 5th met resection, peroneal tenodesis, wound excision and closure. -Edema and cellulitis appear resolved, labs normalized. -Wound VAC on and functioning. Leave intact.  -Cultures pending. Bone culture NGTD, Deep tissue culture with normal flora - would consider broad spectrum PO at discharge if Bone culture remains negative. -Continue PT. -Continue work on pain control. Will provide pain Rx at d/c. -Plan for d/c once Abx plan in place, patient able to WB on extremity safely, pain controlled.   Evelina Bucy, DPM  Accessible via secure chat for questions or concerns.

## 2019-07-09 LAB — GLUCOSE, CAPILLARY
Glucose-Capillary: 181 mg/dL — ABNORMAL HIGH (ref 70–99)
Glucose-Capillary: 338 mg/dL — ABNORMAL HIGH (ref 70–99)
Glucose-Capillary: 247 mg/dL — ABNORMAL HIGH (ref 70–99)
Glucose-Capillary: 199 mg/dL — ABNORMAL HIGH (ref 70–99)

## 2019-07-09 LAB — CBC WITH DIFFERENTIAL/PLATELET
Abs Immature Granulocytes: 0.04 10*3/uL (ref 0.00–0.07)
Basophils Absolute: 0.1 10*3/uL (ref 0.0–0.1)
Basophils Relative: 1 %
Eosinophils Absolute: 0.2 10*3/uL (ref 0.0–0.5)
Eosinophils Relative: 2 %
HCT: 41.7 % (ref 36.0–46.0)
Hemoglobin: 13.4 g/dL (ref 12.0–15.0)
Immature Granulocytes: 0 %
Lymphocytes Relative: 24 %
Lymphs Abs: 2.6 10*3/uL (ref 0.7–4.0)
MCH: 28 pg (ref 26.0–34.0)
MCHC: 32.1 g/dL (ref 30.0–36.0)
MCV: 87.2 fL (ref 80.0–100.0)
Monocytes Absolute: 0.8 10*3/uL (ref 0.1–1.0)
Monocytes Relative: 8 %
Neutro Abs: 7.2 10*3/uL (ref 1.7–7.7)
Neutrophils Relative %: 65 %
Platelets: 280 10*3/uL (ref 150–400)
RBC: 4.78 MIL/uL (ref 3.87–5.11)
RDW: 13.5 % (ref 11.5–15.5)
WBC: 11 10*3/uL — ABNORMAL HIGH (ref 4.0–10.5)
nRBC: 0 % (ref 0.0–0.2)

## 2019-07-09 LAB — BASIC METABOLIC PANEL
Anion gap: 10 (ref 5–15)
BUN: 13 mg/dL (ref 6–20)
CO2: 26 mmol/L (ref 22–32)
Calcium: 8.7 mg/dL — ABNORMAL LOW (ref 8.9–10.3)
Chloride: 102 mmol/L (ref 98–111)
Creatinine, Ser: 0.97 mg/dL (ref 0.44–1.00)
GFR calc Af Amer: >60 mL/min (ref >60)
GFR calc non Af Amer: >60 mL/min (ref >60)
Glucose, Bld: 159 mg/dL — ABNORMAL HIGH (ref 70–99)
Potassium: 4.5 mmol/L (ref 3.5–5.1)
Sodium: 138 mmol/L (ref 135–145)

## 2019-07-09 MED ORDER — HYDROMORPHONE HCL 2 MG PO TABS
4.0000 mg | ORAL_TABLET | ORAL | Status: DC | PRN
  Administered 2019-07-09 – 2019-07-10 (×7): 4 mg via ORAL
  Filled 2019-07-09 (×7): qty 2

## 2019-07-09 MED ORDER — INSULIN ASPART 100 UNIT/ML ~~LOC~~ SOLN
5.0000 [IU] | Freq: Three times a day (TID) | SUBCUTANEOUS | Status: DC
  Administered 2019-07-09 – 2019-07-10 (×3): 5 [IU] via SUBCUTANEOUS

## 2019-07-09 MED ORDER — HYDRALAZINE HCL 20 MG/ML IJ SOLN: 10.0000 mg | Freq: Four times a day (QID) | INTRAMUSCULAR | Status: DC | PRN

## 2019-07-09 NOTE — Progress Notes (Signed)
Inpatient Diabetes Program Recommendations  AACE/ADA: New Consensus Statement on Inpatient Glycemic Control (2015)  Target Ranges:  Prepandial:   less than 140 mg/dL      Peak postprandial:   less than 180 mg/dL (1-2 hours)      Critically ill patients:  140 - 180 mg/dL   Lab Results  Component Value Date   GLUCAP 338 (H) 07/09/2019   HGBA1C 8.1 (H) 07/04/2019    Review of Glycemic Control Results for Katherine Herrera, Katherine Herrera (MRN [id number]) as of 07/09/2019 13:00  Ref. Range 07/08/2019 21:44 07/09/2019 08:04 07/09/2019 12:07  Glucose-Capillary Latest Ref Range: 70 - 99 mg/dL 253 (H) 181 (H) 338 (H)   Diabetes history: Type 2 DM Outpatient Diabetes medications: Novolog 70/30 70 units BID Current orders for Inpatient glycemic control: Novolog 0-20 units TID, Novolog 0-5 units QHS, Levemir 15 units BID  Inpatient Diabetes Program Recommendations:      Consider adding Novolog 5 units TID (assuming patient is consuming >50% of meals).   Thanks, Bronson Curb, MSN, RNC-OB Diabetes Coordinator [phone] (8a-5p)

## 2019-07-09 NOTE — Progress Notes (Signed)
Physical Therapy Treatment Patient Details Name: Katherine Herrera MRN: [id number] DOB: [birth year] Today's Date: 07/09/2019    History of Present Illness 53 yo female with onset of R foot ulcer and cellulitis was admitted to hosp from her PCP's office.  Pt is now seen with R 5th metatarsal resection, has peroneal tendonesis and placement of a wound vac.  PMHx:   DM, COPD, hypothyroidism, anxiety, tarsal tunnel syndrome, tachycardia, conversion syncope, PN, Gout,    PT Comments    Patient received in bed, very excited about working with therapy today. Able to complete bed mobility with S and assist for line management, and all other mobility with Min guard for safety with RW. Able to perform pivot transfer to [facility] and urinated, then able to tolerate gait training approximately 35f with min guard and RW, continues to be severely limited by pain. She was left up in the chair with all needs met and nurse present and attending.     Follow Up Recommendations  CIR     Equipment Recommendations  Rolling walker with 5" wheels    Recommendations for Other Services       Precautions / Restrictions Precautions Precautions: Fall Precaution Comments: cam boot on at all times Required Braces or Orthoses: Other Brace(cam boot) Restrictions Weight Bearing Restrictions: No RLE Weight Bearing: Weight bearing as tolerated    Mobility  Bed Mobility Overal bed mobility: Needs Assistance Bed Mobility: Supine to Sit     Supine to sit: Supervision     General bed mobility comments: S for safety and assist with wound vac line, otherwise no assist given  Transfers Overall transfer level: Needs assistance Equipment used: Rolling walker (2 wheeled) Transfers: Sit to/from SOmnicareSit to Stand: Min guard Stand pivot transfers: Min guard       General transfer comment: min guard for safety, line management  Ambulation/Gait Ambulation/Gait assistance: Min guard Gait Distance  (Feet): 30 Feet Assistive device: Rolling walker (2 wheeled) Gait Pattern/deviations: Step-through pattern;Shuffle;Decreased stride length;Wide base of support Gait velocity:  reduced   General Gait Details: gait distance continues to be severely limited by pain   Stairs             Wheelchair Mobility    Modified Rankin (Stroke Patients Only)       Balance Overall balance assessment: Needs assistance Sitting-balance support: Feet supported;No upper extremity supported Sitting balance-Leahy Scale: Good     Standing balance support: Bilateral upper extremity supported;During functional activity Standing balance-Leahy Scale: Fair Standing balance comment: reliant on external UE support                            Cognition Arousal/Alertness: Awake/alert Behavior During Therapy: WFL for tasks assessed/performed Overall Cognitive Status: Within Functional Limits for tasks assessed                                 General Comments: pt is in cam boot R foot with minimal pain until walking      Exercises      General Comments        Pertinent Vitals/Pain Pain Assessment: 0-10 Pain Score: 5  Pain Location: R foot after walking Pain Descriptors / Indicators: Aching;Operative site guarding Pain Intervention(s): Limited activity within patient's tolerance;Monitored during session    Home Living  Prior Function            PT Goals (current goals can now be found in the care plan section) Acute Rehab PT Goals Patient Stated Goal: to walk and to have less pain PT Goal Formulation: With patient Time For Goal Achievement: 07/20/19 Potential to Achieve Goals: Good Progress towards PT goals: Progressing toward goals    Frequency    Min 3X/week      PT Plan Current plan remains appropriate    Co-evaluation              AM-PAC PT "6 Clicks" Mobility   Outcome Measure  Help needed turning from  your back to your side while in a flat bed without using bedrails?: A Little Help needed moving from lying on your back to sitting on the side of a flat bed without using bedrails?: A Little Help needed moving to and from a bed to a chair (including a wheelchair)?: A Little Help needed standing up from a chair using your arms (e.g., wheelchair or bedside chair)?: A Little Help needed to walk in hospital room?: A Little Help needed climbing 3-5 steps with a railing? : A Lot 6 Click Score: 17    End of Session   Activity Tolerance: Patient limited by pain Patient left: in chair;with call bell/phone within reach;with nursing/sitter in room   PT Visit Diagnosis: Unsteadiness on feet (R26.81);Muscle weakness (generalized) (M62.81);Pain;Adult, failure to thrive (R62.7) Pain - Right/Left: Right Pain - part of body: Ankle and joints of foot     Time: 2993-7169 PT Time Calculation (min) (ACUTE ONLY): 31 min  Charges:  $Gait Training: 8-22 mins $Therapeutic Activity: 8-22 mins                     Deniece Ree PT, DPT, CBIS  Supplemental Physical Therapist [place]    Pager [phone] Acute Rehab Office [phone]

## 2019-07-09 NOTE — TOC Initial Note (Signed)
Transition of Care [facility]) - Initial/Assessment Note    Patient Details  Name: Katherine Herrera MRN: [id number] Date of Birth: [birth year]  Transition of Care [facility]) CM/SW Contact:    Benard Halsted, LCSW Phone Number: 07/09/2019, 2:46 PM  Clinical Narrative:                 CSW received consult regarding PT recommendation of SNF at discharge if CIR is unable to accept patient.  Patient is refusing SNF. She reports that she would prefer to return home with home health if CIR cannot accept her. She states her son is getting her a bedside commode and table tray since he works for a medical supply store, but she is requesting a rolling walker.   Patient is requesting home health services. CSW confirmed her address and PCP. She will go home with her Pra[employer]a wound vac. CSW will reach out to home health agencies.     Cedric Fishman LCSW [phone]    Expected Discharge Plan: Home w Home Health Services Barriers to Discharge: Continued Medical Work up   Patient Goals and CMS Choice Patient states their goals for this hospitalization and ongoing recovery are:: Rehab CMS Medicare.gov Compare Post Acute Care list provided to:: Patient Choice offered to / list presented to : Patient  Expected Discharge Plan and Services Expected Discharge Plan: [place] In-house Referral: Clinical Social Work Discharge Planning Services: CM Consult Post Acute Care Choice: Home Health, Durable Medical Equipment Living arrangements for the past 2 months: Single Family Home                 DME Arranged: Walker rolling DME Agency: [employer]       HH Arranged: RN, PT          Prior Living Arrangements/Services Living arrangements for the past 2 months: Single Family Home Lives with:: Spouse Patient language and need for interpreter reviewed:: Yes Do you feel safe going back to the place where you live?: Yes      Need for Family Participation in Patient Care: No (Comment) Care  giver support system in place?: Yes (comment)   Criminal Activity/Legal Involvement Pertinent to Current Situation/Hospitalization: No - Comment as needed  Activities of Daily Living Home Assistive Devices/Equipment: CBG Meter ADL Screening (condition at time of admission) Patient's cognitive ability adequate to safely complete daily activities?: Yes Is the patient deaf or have difficulty hearing?: No Does the patient have difficulty seeing, even when wearing glasses/contacts?: No Does the patient have difficulty concentrating, remembering, or making decisions?: No Patient able to express need for assistance with ADLs?: Yes Does the patient have difficulty dressing or bathing?: Yes Independently performs ADLs?: Yes (appropriate for developmental age) Does the patient have difficulty walking or climbing stairs?: Yes Weakness of Legs: Both Weakness of Arms/Hands: None  Permission Sought/Granted Permission sought to share information with : Facility Art therapist granted to share information with : Yes, Verbal Permission Granted     Permission granted to share info w AGENCY: Home Health        Emotional Assessment Appearance:: Appears stated age Attitude/Demeanor/Rapport: Engaged Affect (typically observed): Accepting, Appropriate Orientation: : Oriented to Self, Oriented to Place, Oriented to  Time, Oriented to Situation Alcohol / Substance Use: Not Applicable Psych Involvement: No (comment)  Admission diagnosis:  rt foot celulitis diabetic ulcer Patient Active Problem List   Diagnosis Date Noted  . Peroneal tendonitis, right   . Right foot ulcer ([place]) 07/04/2019  .  Capsulitis of metatarsophalangeal (MTP) joint of right foot   . Exostosis of bone of foot   . Preop exam for internal medicine 12/19/2018  . Polyneuropathy due to type 2 diabetes mellitus ([place]) 10/01/2018  . Mild renal insufficiency 09/29/2018  . Hyponatremia 09/29/2018  . Chronic pain  09/29/2018  . Cellulitis of left lower leg   . Diabetic foot ulcer ([employer])   . Sepsis due to cellulitis ([place]) 09/28/2018  . Syncope 07/30/2018  . Earache on right 08/02/2017  . Cerumen impaction 08/02/2017  . Constipation due to pain medication 05/22/2017  . Elevated liver enzymes 05/22/2017  . AKI (acute kidney injury) ([place]) 05/03/2017  . Charcot foot due to diabetes mellitus ([place]) 04/23/2017  . Leg swelling 10/28/2016  . Knee contusion 01/13/2016  . Pain from implanted hardware 01/06/2016  . Cellulitis and abscess of leg 07/24/2015  . Nausea without vomiting 07/24/2015  . Leg edema, left 06/19/2015  . DOE (dyspnea on exertion) 06/16/2015  . Chest pain, atypical 06/16/2015  . Family history of musculoskeletal disease 03/12/2015  . Non-compliant behavior 12/24/2014  . Ulcer of right foot limited to breakdown of skin ([place]) 09/02/2014  . Ulcer of foot due to diabetes mellitus ([place]) 08/12/2014  . Ulcer of other part of foot 06/20/2014  . Leg pain, left 03/31/2014  . Callus of foot 03/25/2014  . 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 Rash 01/28/2014  . Pain in lower limb 01/24/2014  . Keratosis 01/24/2014  . Leg cramps 10/09/2013  . Vaginitis and vulvovaginitis 10/09/2013  . Ingrown nail 09/30/2013  . Pain in toe of left foot 09/30/2013  . Tenosynovitis of foot and ankle 09/03/2013  . Metatarsal deformity, left 09/03/2013  . Pain in joint, ankle and foot 01/22/2013  . Pes cavus 10/13/2012  . Acquired tight Achilles tendon 10/13/2012  . Paronychia 06/14/2012  . 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 ([place]) 03/19/2012  . Allergic rhinitis 03/19/2012  . URI (upper respiratory infection) 08/30/2011  . Myalgia 04/18/2011  . SHOULDER PAIN 01/17/2011  . TTS (tarsal tunnel syndrome)   . GASTROENTERITIS 10/18/2010  . ARTHRALGIA 01/04/2010  . WEIGHT GAIN, ABNORMAL 01/04/2010  . TACHYCARDIA 10/02/2009  . CBC, ABNORMAL 05/01/2009  . B12 deficiency 01/05/2009  . GOUT 01/05/2009   . Edema 01/05/2009  . Anxiety state 07/28/2008  . ELBOW PAIN 07/28/2008  . TOBACCO USE DISORDER/SMOKER-SMOKING CESSATION DISCUSSED 04/21/2008  . FOOT PAIN 04/21/2008  . PARESTHESIA 04/21/2008  . Abdominal pain 04/21/2008  . SINUSITIS- ACUTE-NOS 11/13/2007  . COPD exacerbation ([place]) 11/13/2007  . Diabetic neuropathy ([place]) 08/27/2007  . LOW BACK PAIN 08/27/2007  . Dyslipidemia 08/20/2007  . Hypothyroidism 05/22/2007   PCP:  Cassandria Anger, MD Pharmacy:   [facility], [address] [address] [place] [address] Phone: [phone] Fax: [phone]  [id number] - [place], [place] [address] Phone: [phone] Fax: [phone]     Social Determinants of Health (SDOH) Interventions    Readmission Risk Interventions No flowsheet data found.

## 2019-07-09 NOTE — Progress Notes (Signed)
Inpatient Rehabilitation Admissions Coordinator  Patient is not a candidate for CIR at min guard assist level. Recommend Home with [facility] and family assist or SNF.  Danne [place], RN, MSN Rehab Admissions Coordinator [phone] 07/09/2019 5:22 PM

## 2019-07-09 NOTE — Progress Notes (Signed)
PROGRESS NOTE  Katherine Herrera KCL:[id number] DOB: Jul 03, 1966 DOA: 07/04/2019 PCP: Cassandria Anger, MD  HPI/Recap of past 24 hours: HPI from Dr Preston Fleeting Katherine Herrera is a 53 y.o. female with medical history significant of diabetes, hyperlipidemia, hypothyroidism, COPD who was sent over for direct admission by podiatry.  Patient went to see them with right lower extremity diabetic ulcer and swelling. Patient was initiated on ciprofloxacin and clindamycin, without significant improvement. There was concern for possible osteomyelitis and pt was therefore sent over for direct admission for further management as she failed outpatient treatment. Podiatry will follow-up.    Today, patient still reporting some postop pain, otherwise no new complaints.   Assessment/Plan: Principal Problem:   Diabetic foot ulcer ([place]) Active Problems:   Hypothyroidism   Dyslipidemia   GOUT   Anxiety state   Diabetic neuropathy (HCC)   Right foot ulcer (HCC)   Peroneal tendonitis, right  Diabetic R foot ulceration with cellulitis, concern for osteomyelitis of 5th metatarsal R foot s/p fifth metatarsal resection, wound excision and closure, application of wound VAC on 07/05/2019 by podiatry Currently afebrile, with fluctuating leukocytosis ESR 48, CRP 1 BC X 2 NGTD MRI R foot suspicious for early osteomyelitis Podiatry on board, appreciate recs Bone culture showing NGTD, deep tissue culture showed normal skin flora, awaiting final cultures Continue IV vancomycin, ceftriaxone, pending cultures Pain management by podiatry/surgery, bowel regimen Monitor closely  AKI Resolved Likely 2/2 to above Daily BMP  Diabetes mellitus type 2 Uncontrolled, A1c 8.1 SSI, novolog TID, hypoglycemic protocol, accuchecks Continue Levemir 15U BID Follows with an endocrinologist  HTN Stable Continue to hold home lisinopril 2/2 to AKI IV hydralazine PRN  Hypothyroidism Continue synthroid  Chronic back  pain/peripheral neuropathy Continue home baclofen, gabapentin, oxycodone   Anxiety Continue home klonopin prn  Seizure disorder/RLS Continue Lamictal, Requip  Tobacco abuse Continue nicotine patch       Malnutrition Type:      Malnutrition Characteristics:      Nutrition Interventions:       Estimated body mass index is 33.79 kg/m as calculated from the following:   Height as of this encounter: '5\' 9"'  (1.753 m).   Weight as of this encounter: 103.8 kg.     Code Status: Full  Family Communication: None at bedside  Disposition Plan: To be determined   Consultants:  Podiatry    Procedures:  Amputation of 5th metatarsal on 07/05/19  Antimicrobials:  Vancomycin  Ceftriaxone   DVT prophylaxis: Lovenox   Objective: Vitals:   07/08/19 0605 07/08/19 1249 07/08/19 2136 07/09/19 0347  BP: 134/62 131/73 (!) 176/90 (!) 145/73  Pulse: 72 65 86 77  Resp: '16 16 16 16  ' Temp: 97.6 F (36.4 C) 98 F (36.7 C) 98.3 F (36.8 C) 100 F (37.8 C)  TempSrc:      SpO2: 98% 95% 96% 97%  Weight:      Height:        Intake/Output Summary (Last 24 hours) at 07/09/2019 1446 Last data filed at 07/09/2019 1230 Gross per 24 hour  Intake 1440 ml  Output 1200 ml  Net 240 ml   Filed Weights   07/05/19 1231  Weight: 103.8 kg    Exam:  General: NAD   Cardiovascular: S1, S2 present  Respiratory: CTAB  Abdomen: Soft, nontender, nondistended, bowel sounds present  Musculoskeletal: Trace bilateral pedal edema noted, right foot dressing C/D/I  Skin: Normal  Psychiatry: Normal mood     Data Reviewed: CBC: Recent Labs  Lab 07/05/19 0243 07/06/19 0336 07/07/19 0328 07/08/19 0353 07/09/19 0358  WBC 11.9* 12.2* 9.8 10.9* 11.0*  NEUTROABS  --  8.7* 5.9 7.0 7.2  HGB 12.6 12.5 12.4 12.7 13.4  HCT 39.1 39.6 39.2 40.1 41.7  MCV 87.9 89.4 89.1 88.7 87.2  PLT 298 281 265 283 235   Basic Metabolic Panel: Recent Labs  Lab 07/05/19 0243 07/06/19 0336  07/07/19 0328 07/08/19 0353 07/09/19 0358  NA 136 133* 139 137 138  K 4.2 5.0 4.9 5.0 4.5  CL 104 102 108 105 102  CO2 '24 24 25 26 26  ' GLUCOSE 241* 375* 252* 156* 159*  BUN 24* 25* '19 14 13  ' CREATININE 1.26* 1.25* 1.09* 0.99 0.97  CALCIUM 8.1* 8.0* 8.1* 8.4* 8.7*   GFR: Estimated Creatinine Clearance: 86 mL/min (by C-G formula based on SCr of 0.97 mg/dL). Liver Function Tests: Recent Labs  Lab 07/05/19 0243  AST 16  ALT 13  ALKPHOS 94  BILITOT 0.4  PROT 6.4*  ALBUMIN 2.8*   No results for input(s): LIPASE, AMYLASE in the last 168 hours. No results for input(s): AMMONIA in the last 168 hours. Coagulation Profile: No results for input(s): INR, PROTIME in the last 168 hours. Cardiac Enzymes: No results for input(s): CKTOTAL, CKMB, CKMBINDEX, TROPONINI in the last 168 hours. BNP (last 3 results) No results for input(s): PROBNP in the last 8760 hours. HbA1C: No results for input(s): HGBA1C in the last 72 hours. CBG: Recent Labs  Lab 07/08/19 1318 07/08/19 1638 07/08/19 2144 07/09/19 0804 07/09/19 1207  GLUCAP 211* 200* 253* 181* 338*   Lipid Profile: No results for input(s): CHOL, HDL, LDLCALC, TRIG, CHOLHDL, LDLDIRECT in the last 72 hours. Thyroid Function Tests: No results for input(s): TSH, T4TOTAL, FREET4, T3FREE, THYROIDAB in the last 72 hours. Anemia Panel: No results for input(s): VITAMINB12, FOLATE, FERRITIN, TIBC, IRON, RETICCTPCT in the last 72 hours. Urine analysis:    Component Value Date/Time   COLORURINE YELLOW 02/20/2019 1330   APPEARANCEUR CLEAR 02/20/2019 1330   LABSPEC 1.010 02/20/2019 1330   PHURINE 5.5 02/20/2019 1330   GLUCOSEU NEGATIVE 02/20/2019 1330   HGBUR NEGATIVE 02/20/2019 1330   BILIRUBINUR NEGATIVE 02/20/2019 1330   KETONESUR NEGATIVE 02/20/2019 1330   PROTEINUR NEGATIVE 09/28/2018 2318   UROBILINOGEN 0.2 02/20/2019 1330   NITRITE NEGATIVE 02/20/2019 1330   LEUKOCYTESUR NEGATIVE 02/20/2019 1330   Sepsis Labs:  '@LABRCNTIP' (procalcitonin:4,lacticidven:4)  ) Recent Results (from the past 240 hour(s))  MRSA PCR Screening     Status: None   Collection Time: 07/05/19 12:46 AM   Specimen: Nasopharyngeal  Result Value Ref Range Status   MRSA by PCR NEGATIVE NEGATIVE Final    Comment:        The GeneXpert MRSA Assay (FDA approved for NASAL specimens only), is one component of a comprehensive MRSA colonization surveillance program. It is not intended to diagnose MRSA infection nor to guide or monitor treatment for MRSA infections. Performed at [facility], [place] [address], [place], [place] [address]   SARS CORONAVIRUS 2 (TAT 6-24 HRS) Nasopharyngeal Nasopharyngeal Swab     Status: None   Collection Time: 07/05/19 12:46 AM   Specimen: Nasopharyngeal Swab  Result Value Ref Range Status   SARS Coronavirus 2 NEGATIVE NEGATIVE Final    Comment: (NOTE) SARS-CoV-2 target nucleic acids are NOT DETECTED. The SARS-CoV-2 RNA is generally detectable in upper and lower respiratory specimens during the acute phase of infection. Negative results do not preclude SARS-CoV-2 infection, do not rule  out co-infections with other pathogens, and should not be used as the sole basis for treatment or other patient management decisions. Negative results must be combined with clinical observations, patient history, and epidemiological information. The expected result is Negative. Fact Sheet for Patients: SugarRoll.be Fact Sheet for Healthcare Providers: https://www.woods-mathews.com/ This test is not yet approved or cleared by the [place] FDA and  has been authorized for detection and/or diagnosis of SARS-CoV-2 by FDA under an Emergency Use Authorization (EUA). This EUA will remain  in effect (meaning this test can be used) for the duration of the COVID-19 declaration under Section 56 4(b)(1) of the Act, 21 U.S.C. section 360bbb-3(b)(1), unless the authorization  is terminated or revoked sooner. Performed at [facility], [place] [address], [place], [place] [address]   Aerobic/Anaerobic Culture (surgical/deep wound)     Status: None (Preliminary result)   Collection Time: 07/05/19  1:17 PM   Specimen: Foot, Right; Tissue  Result Value Ref Range Status   Specimen Description BONE  Final   Special Requests RIGHT FOOT SAMPLE A METATARSAL CLEAN MARGIN  Final   Gram Stain NO WBC SEEN NO ORGANISMS SEEN   Final   Culture   Final    NO GROWTH 4 DAYS NO ANAEROBES ISOLATED; CULTURE IN PROGRESS FOR 5 DAYS Performed at [facility], [place] [address], [place], [place] [address]    Report Status PENDING  Incomplete  Aerobic/Anaerobic Culture (surgical/deep wound)     Status: None (Preliminary result)   Collection Time: 07/05/19  1:46 PM   Specimen: Foot, Right; Tissue  Result Value Ref Range Status   Specimen Description FOOT  Final   Special Requests RIGHT  Final   Gram Stain   Final    RARE WBC PRESENT, PREDOMINANTLY PMN NO ORGANISMS SEEN Performed at [facility], [place] [address] [place], [place] [address]    Culture   Final    RARE NORMAL SKIN FLORA NO ANAEROBES ISOLATED; CULTURE IN PROGRESS FOR 5 DAYS    Report Status PENDING  Incomplete  Culture, blood (routine x 2)     Status: None (Preliminary result)   Collection Time: 07/05/19  4:36 PM   Specimen: BLOOD RIGHT ARM  Result Value Ref Range Status   Specimen Description BLOOD RIGHT ARM  Final   Special Requests   Final    BOTTLES DRAWN AEROBIC ONLY Blood Culture adequate volume   Culture   Final    NO GROWTH 4 DAYS Performed at [facility], [place] [address], [place], [place] [address]    Report Status PENDING  Incomplete  Culture, blood (routine x 2)     Status: None (Preliminary result)   Collection Time: 07/05/19  4:36 PM   Specimen: BLOOD RIGHT ARM  Result Value Ref Range Status   Specimen Description BLOOD RIGHT ARM  Final   Special Requests   Final     BOTTLES DRAWN AEROBIC ONLY Blood Culture adequate volume   Culture   Final    NO GROWTH 4 DAYS Performed at [facility], [place] [address] [place], [place] [address]    Report Status PENDING  Incomplete      Studies: No results found.  Scheduled Meds: . aspirin  325 mg Oral Daily  . baclofen  20 mg Oral BID  . enoxaparin (LOVENOX) injection  40 mg Subcutaneous Q24H  . gabapentin  800 mg Oral QID  . insulin aspart  0-20 Units Subcutaneous TID WC  .  insulin aspart  0-5 Units Subcutaneous QHS  . insulin aspart  5 Units Subcutaneous TID WC  . insulin detemir  15 Units Subcutaneous BID  . lamoTRIgine  25 mg Oral BID  . levothyroxine  75 mcg Oral Q0600  . nicotine  21 mg Transdermal Daily  . polyethylene glycol  17 g Oral BID  . rOPINIRole  0.5 mg Oral QHS  . senna-docusate  1 tablet Oral BID  . sodium chloride flush  10-40 mL Intracatheter Q12H    Continuous Infusions: . cefTRIAXone (ROCEPHIN)  IV 2 g (07/09/19 0834)  . lactated ringers 10 mL/hr at 07/05/19 1236  . vancomycin Stopped (07/09/19 0152)     LOS: 5 days     Alma Friendly, MD [facility]  If 7PM-7AM, please contact night-coverage [email or web address] 07/09/2019, 2:46 PM

## 2019-07-09 NOTE — Progress Notes (Signed)
Subjective:  Patient ID: Katherine Herrera, female    DOB: 11-26-1965,  MRN: [id number]  Patient seen at bedside. Again did not work with PT yesterday. Pain under control with Dilaudid PO but not lasting long enough. Objective:   Vitals:   07/08/19 2136 07/09/19 0347  BP: (!) 176/90 (!) 145/73  Pulse: 86 77  Resp: 16 16  Temp: 98.3 F (36.8 C) 100 F (37.8 C)  SpO2: 96% 97%   General AA&O x3. Normal mood and affect.  Vascular Dorsalis pedis and posterior tibial pulses 2/4 bilat. Brisk capillary refill to all digits. Pedal hair present.  Neurologic Epicritic sensation grossly diminished.  Dermatologic Cellulitis resolved. Edema resolved. Dressing intact RLE. [place] on and functioning.  Orthopedic: Positive motor distally.   Results for orders placed or performed during the hospital encounter of 07/04/19  MRSA PCR Screening     Status: None   Collection Time: 07/05/19 12:46 AM   Specimen: Nasopharyngeal  Result Value Ref Range Status   MRSA by PCR NEGATIVE NEGATIVE Final    Comment:        The GeneXpert MRSA Assay (FDA approved for NASAL specimens only), is one component of a comprehensive MRSA colonization surveillance program. It is not intended to diagnose MRSA infection nor to guide or monitor treatment for MRSA infections. Performed at [facility], [place] [address], [place], [place] [address]   SARS CORONAVIRUS 2 (TAT 6-24 HRS) Nasopharyngeal Nasopharyngeal Swab     Status: None   Collection Time: 07/05/19 12:46 AM   Specimen: Nasopharyngeal Swab  Result Value Ref Range Status   SARS Coronavirus 2 NEGATIVE NEGATIVE Final    Comment: (NOTE) SARS-CoV-2 target nucleic acids are NOT DETECTED. The SARS-CoV-2 RNA is generally detectable in upper and lower respiratory specimens during the acute phase of infection. Negative results do not preclude SARS-CoV-2 infection, do not rule out co-infections with other pathogens, and should not be used as the sole basis for  treatment or other patient management decisions. Negative results must be combined with clinical observations, patient history, and epidemiological information. The expected result is Negative. Fact Sheet for Patients: SugarRoll.be Fact Sheet for Healthcare Providers: https://www.woods-mathews.com/ This test is not yet approved or cleared by the [place] FDA and  has been authorized for detection and/or diagnosis of SARS-CoV-2 by FDA under an Emergency Use Authorization (EUA). This EUA will remain  in effect (meaning this test can be used) for the duration of the COVID-19 declaration under Section 56 4(b)(1) of the Act, 21 U.S.C. section 360bbb-3(b)(1), unless the authorization is terminated or revoked sooner. Performed at [facility], [place] [address] [place], [place] [address]   Aerobic/Anaerobic Culture (surgical/deep wound)     Status: None (Preliminary result)   Collection Time: 07/05/19  1:17 PM   Specimen: Foot, Right; Tissue  Result Value Ref Range Status   Specimen Description BONE  Final   Special Requests RIGHT FOOT SAMPLE A METATARSAL CLEAN MARGIN  Final   Gram Stain NO WBC SEEN NO ORGANISMS SEEN   Final   Culture   Final    NO GROWTH 3 DAYS NO ANAEROBES ISOLATED; CULTURE IN PROGRESS FOR 5 DAYS Performed at [facility], [place] [address], [place], [place] [address]    Report Status PENDING  Incomplete  Aerobic/Anaerobic Culture (surgical/deep wound)     Status: None (Preliminary result)   Collection Time: 07/05/19  1:46 PM   Specimen: Foot, Right; Tissue  Result Value Ref Range Status  Specimen Description FOOT  Final   Special Requests RIGHT  Final   Gram Stain   Final    RARE WBC PRESENT, PREDOMINANTLY PMN NO ORGANISMS SEEN Performed at [facility], [address] [place], [place] [address]    Culture   Final    RARE NORMAL SKIN FLORA NO ANAEROBES ISOLATED; CULTURE IN PROGRESS FOR 5 DAYS     Report Status PENDING  Incomplete  Culture, blood (routine x 2)     Status: None (Preliminary result)   Collection Time: 07/05/19  4:36 PM   Specimen: BLOOD RIGHT ARM  Result Value Ref Range Status   Specimen Description BLOOD RIGHT ARM  Final   Special Requests   Final    BOTTLES DRAWN AEROBIC ONLY Blood Culture adequate volume   Culture   Final    NO GROWTH 4 DAYS Performed at [facility], [place] [address] [place], [place] [address]    Report Status PENDING  Incomplete  Culture, blood (routine x 2)     Status: None (Preliminary result)   Collection Time: 07/05/19  4:36 PM   Specimen: BLOOD RIGHT ARM  Result Value Ref Range Status   Specimen Description BLOOD RIGHT ARM  Final   Special Requests   Final    BOTTLES DRAWN AEROBIC ONLY Blood Culture adequate volume   Culture   Final    NO GROWTH 4 DAYS Performed at [facility], [place] [address] [place], [place] [address]    Report Status PENDING  Incomplete   Results for orders placed or performed during the hospital encounter of 07/04/19 (from the past 24 hour(s))  Glucose, capillary     Status: Abnormal   Collection Time: 07/08/19  1:18 PM  Result Value Ref Range   Glucose-Capillary 211 (H) 70 - 99 mg/dL  Glucose, capillary     Status: Abnormal   Collection Time: 07/08/19  4:38 PM  Result Value Ref Range   Glucose-Capillary 200 (H) 70 - 99 mg/dL  Glucose, capillary     Status: Abnormal   Collection Time: 07/08/19  9:44 PM  Result Value Ref Range   Glucose-Capillary 253 (H) 70 - 99 mg/dL  CBC with Differential/Platelet     Status: Abnormal   Collection Time: 07/09/19  3:58 AM  Result Value Ref Range   WBC 11.0 (H) 4.0 - 10.5 K/uL   RBC 4.78 3.87 - 5.11 MIL/uL   Hemoglobin 13.4 12.0 - 15.0 g/dL   HCT 41.7 36.0 - 46.0 %   MCV 87.2 80.0 - 100.0 fL   MCH 28.0 26.0 - 34.0 pg   MCHC 32.1 30.0 - 36.0 g/dL   RDW 13.5 11.5 - 15.5 %   Platelets 280 150 - 400 K/uL   nRBC 0.0 0.0 - 0.2 %   Neutrophils Relative % 65 %    Neutro Abs 7.2 1.7 - 7.7 K/uL   Lymphocytes Relative 24 %   Lymphs Abs 2.6 0.7 - 4.0 K/uL   Monocytes Relative 8 %   Monocytes Absolute 0.8 0.1 - 1.0 K/uL   Eosinophils Relative 2 %   Eosinophils Absolute 0.2 0.0 - 0.5 K/uL   Basophils Relative 1 %   Basophils Absolute 0.1 0.0 - 0.1 K/uL   Immature Granulocytes 0 %   Abs Immature Granulocytes 0.04 0.00 - 0.07 K/uL  Basic metabolic panel     Status: Abnormal   Collection Time: 07/09/19  3:58 AM  Result Value Ref Range   Sodium  138 135 - 145 mmol/L   Potassium 4.5 3.5 - 5.1 mmol/L   Chloride 102 98 - 111 mmol/L   CO2 26 22 - 32 mmol/L   Glucose, Bld 159 (H) 70 - 99 mg/dL   BUN 13 6 - 20 mg/dL   Creatinine, Ser 0.97 0.44 - 1.00 mg/dL   Calcium 8.7 (L) 8.9 - 10.3 mg/dL   GFR calc non Af Amer >60 >60 mL/min   GFR calc Af Amer >60 >60 mL/min   Anion gap 10 5 - 15  Glucose, capillary     Status: Abnormal   Collection Time: 07/09/19  8:04 AM  Result Value Ref Range   Glucose-Capillary 181 (H) 70 - 99 mg/dL    Assessment & Plan:  Patient was evaluated and treated and all questions answered.  Right Foot Ulceration with Cellulitis, Concern for OM; S/p 5th met resection, peroneal tenodesis, wound excision and closure. -Edema and cellulitis appear resolved, labs normalized. -Wound VAC on and functioning. Leave intact.  -Cultures pending. Bone culture NGTD, Deep tissue culture with normal flora - would consider broad spectrum PO at discharge if Bone culture remains negative. -Continue PT. -Continue work on pain control. Switch to Dilaudid 52m PO q3hrs. Will provide pain Rx at d/c.  -Plan for d/c once Abx plan in place, patient able to WB on extremity safely, pain controlled.   MEvelina Bucy DPM  Accessible via secure chat for questions or concerns.

## 2019-07-10 ENCOUNTER — Telehealth: Payer: Self-pay | Admitting: *Deleted

## 2019-07-10 ENCOUNTER — Other Ambulatory Visit: Payer: Self-pay | Admitting: Podiatry

## 2019-07-10 DIAGNOSIS — E039 Hypothyroidism, unspecified: Secondary | ICD-10-CM

## 2019-07-10 LAB — AEROBIC/ANAEROBIC CULTURE W GRAM STAIN (SURGICAL/DEEP WOUND)
Culture: NO GROWTH
Culture: NORMAL
Gram Stain: NONE SEEN

## 2019-07-10 LAB — CBC WITH DIFFERENTIAL/PLATELET
Abs Immature Granulocytes: 0.04 10*3/uL (ref 0.00–0.07)
Basophils Absolute: 0.1 10*3/uL (ref 0.0–0.1)
Basophils Relative: 1 %
Eosinophils Absolute: 0.3 10*3/uL (ref 0.0–0.5)
Eosinophils Relative: 3 %
HCT: 40.8 % (ref 36.0–46.0)
Hemoglobin: 13.2 g/dL (ref 12.0–15.0)
Immature Granulocytes: 0 %
Lymphocytes Relative: 25 %
Lymphs Abs: 2.8 10*3/uL (ref 0.7–4.0)
MCH: 28.1 pg (ref 26.0–34.0)
MCHC: 32.4 g/dL (ref 30.0–36.0)
MCV: 86.8 fL (ref 80.0–100.0)
Monocytes Absolute: 1 10*3/uL (ref 0.1–1.0)
Monocytes Relative: 9 %
Neutro Abs: 6.9 10*3/uL (ref 1.7–7.7)
Neutrophils Relative %: 62 %
Platelets: 286 10*3/uL (ref 150–400)
RBC: 4.7 MIL/uL (ref 3.87–5.11)
RDW: 13.2 % (ref 11.5–15.5)
WBC: 11.2 10*3/uL — ABNORMAL HIGH (ref 4.0–10.5)
nRBC: 0 % (ref 0.0–0.2)

## 2019-07-10 LAB — CULTURE, BLOOD (ROUTINE X 2)
Culture: NO GROWTH
Culture: NO GROWTH
Special Requests: ADEQUATE
Special Requests: ADEQUATE

## 2019-07-10 LAB — BASIC METABOLIC PANEL
Anion gap: 9 (ref 5–15)
BUN: 11 mg/dL (ref 6–20)
CO2: 27 mmol/L (ref 22–32)
Calcium: 9 mg/dL (ref 8.9–10.3)
Chloride: 101 mmol/L (ref 98–111)
Creatinine, Ser: 0.97 mg/dL (ref 0.44–1.00)
GFR calc Af Amer: >60 mL/min (ref >60)
GFR calc non Af Amer: >60 mL/min (ref >60)
Glucose, Bld: 158 mg/dL — ABNORMAL HIGH (ref 70–99)
Potassium: 4.7 mmol/L (ref 3.5–5.1)
Sodium: 137 mmol/L (ref 135–145)

## 2019-07-10 LAB — GLUCOSE, CAPILLARY
Glucose-Capillary: 164 mg/dL — ABNORMAL HIGH (ref 70–99)
Glucose-Capillary: 273 mg/dL — ABNORMAL HIGH (ref 70–99)

## 2019-07-10 MED ORDER — DOXYCYCLINE HYCLATE 100 MG PO TABS: 100.0000 mg | ORAL_TABLET | Freq: Two times a day (BID) | ORAL | 0 refills | Status: DC

## 2019-07-10 MED ORDER — POLYETHYLENE GLYCOL 3350 17 G PO PACK: 17.0000 g | PACK | Freq: Every day | ORAL | 0 refills | Status: DC | PRN

## 2019-07-10 MED ORDER — AMOXICILLIN-POT CLAVULANATE 875-125 MG PO TABS: 1.0000 | ORAL_TABLET | Freq: Two times a day (BID) | ORAL | 0 refills | Status: DC

## 2019-07-10 MED ORDER — SENNOSIDES-DOCUSATE SODIUM 8.6-50 MG PO TABS: 1.0000 | ORAL_TABLET | Freq: Two times a day (BID) | ORAL | 0 refills | Status: DC

## 2019-07-10 MED ORDER — HYDROMORPHONE HCL 4 MG PO TABS: 4.0000 mg | ORAL_TABLET | ORAL | 0 refills | Status: DC | PRN

## 2019-07-10 NOTE — TOC Progression Note (Signed)
Transition of Care [facility]) - Progression Note    Patient Details  Name: Katherine Herrera MRN: [id number] Date of Birth: May 13, 1966  Transition of Care [facility]) CM/SW [place], LCSW Phone Number: 07/10/2019, 10:00 AM  Clinical Narrative:    Patient has been denied by [facility], [facility], [facility], and [facility]. [name] is reviewing. Rolling walker to be delivered to the room.    Expected Discharge Plan: [place] Barriers to Discharge: Continued Medical Work up  Expected Discharge Plan and Services Expected Discharge Plan: [place] In-house Referral: Clinical Social Work Discharge Planning Services: CM Consult Post Acute Care Choice: Home Health, Durable Medical Equipment Living arrangements for the past 2 months: Single Family Home Expected Discharge Date: 07/10/19               DME Arranged: Gilford Rile rolling DME Agency: [facility]       HH Arranged: RN, PT           Social Determinants of Health (SDOH) Interventions    Readmission Risk Interventions No flowsheet data found.

## 2019-07-10 NOTE — Progress Notes (Signed)
Nsg Discharge Note  Admit Date:  07/04/2019 Discharge date: 07/10/2019   [name] to be D/C'd Home per MD order.  AVS completed.  Patient/caregiver able to verbalize understanding.  Discharge Medication: Allergies as of 07/10/2019      Reactions   Hydromorphone Hcl Itching   Patient has been tolerating Hydromorphone tablets without adverse effect (07/09/19)   Cephalexin Nausea And Vomiting   Demerol  [meperidine Hcl] Rash   Lovastatin Other (See Comments)   Other reaction(s): Unknown (See Comments) Possible myalgia Unknown    Metformin And Related Nausea And Vomiting   Sulfamethoxazole-trimethoprim    Other reaction(s): Unknown DILI, pancreatitis   Crestor [rosuvastatin Calcium]    myalgia   Niacin Other (See Comments)   Unknown    Paroxetine Other (See Comments)   Other reaction(s): Unknown (See Comments) unknown Unknown       Medication List    STOP taking these medications   docusate sodium 100 MG capsule Commonly known as: COLACE   oxyCODONE 15 MG immediate release tablet Commonly known as: ROXICODONE     TAKE these medications   accu-chek softclix lancets Test two times daily   amoxicillin-clavulanate 875-125 MG tablet Commonly known as: Augmentin Take 1 tablet by mouth 2 (two) times daily for 10 days.   aspirin 325 MG tablet Commonly known as: Bayer Aspirin Take 1 tablet (325 mg total) by mouth daily.   B-D INS SYR ULTRAFINE 1CC/31G 31G X 5/16" 1 ML Misc Generic drug: Insulin Syringe-Needle U-100 USE AS DIRECTED   Insulin Syringe-Needle U-100 31G X 5/16" 0.5 ML Misc Commonly known as: Sure Comfort Insulin Syringe USE AS DIRECTED WITH INSULIN 4 TIMES A DAY   baclofen 10 MG tablet Commonly known as: LIORESAL Take 2 tablets (20 mg total) by mouth 2 (two) times daily.   cholecalciferol 25 MCG (1000 UT) tablet Commonly known as: VITAMIN D Take 2 tablets (2,000 Units total) by mouth daily.   clonazePAM 0.5 MG tablet Commonly known as:  KLONOPIN Take 1 tablet (0.5 mg total) by mouth 2 (two) times daily as needed for anxiety.   doxycycline 100 MG tablet Commonly known as: VIBRA-TABS Take 1 tablet (100 mg total) by mouth 2 (two) times daily for 10 days.   ezetimibe 10 MG tablet Commonly known as: ZETIA Take 1 tablet (10 mg total) by mouth daily.   gabapentin 800 MG tablet Commonly known as: NEURONTIN Take 1 tablet (800 mg total) by mouth 4 (four) times daily.   glucose blood test strip Commonly known as: OneTouch Verio Use to check blood sugar 2 times per day.   insulin NPH-regular Human (70-30) 100 UNIT/ML injection Inject 70 Units into the skin 2 (two) times daily with a meal.   lamoTRIgine 25 MG tablet Commonly known as: LAMICTAL TAKE 1 TABLET BY MOUTH TWO  TIMES DAILY   levothyroxine 75 MCG tablet Commonly known as: SYNTHROID TAKE 1 TABLET BY MOUTH  DAILY   lisinopril 10 MG tablet Commonly known as: ZESTRIL Take 1 tablet (10 mg total) by mouth daily.   loratadine 10 MG tablet Commonly known as: CLARITIN Take 10 mg by mouth daily as needed for allergies.   OneTouch Verio w/Device Kit   polyethylene glycol 17 g packet Commonly known as: MIRALAX / GLYCOLAX Take 17 g by mouth daily as needed for moderate constipation.   rOPINIRole 0.5 MG tablet Commonly known as: REQUIP Take 1 tablet (0.5 mg total) by mouth at bedtime.   senna-docusate 8.6-50 MG tablet Commonly  known as: Senokot-S Take 1 tablet by mouth 2 (two) times daily.   vitamin B-12 1000 MCG tablet Commonly known as: CYANOCOBALAMIN Take 1,000 mcg by mouth daily.            Durable Medical Equipment  (From admission, onward)         Start     Ordered   07/10/19 1002  For home use only DME Walker rolling  Once    Question:  Patient needs a walker to treat with the following condition  Answer:  Weakness   07/10/19 1001          Discharge Assessment: Vitals:   07/10/19 0557 07/10/19 1525  BP: 111/65 113/66  Pulse: 72 86   Resp:  18  Temp: 98.2 F (36.8 C) 98.1 F (36.7 C)  SpO2: 92% 96%   Skin clean, dry and intact without evidence of skin break down, no evidence of skin tears noted. IV catheter discontinued intact. Site without signs and symptoms of complications - no redness or edema noted at insertion site, patient denies c/o pain - only slight tenderness at site.  Dressing with slight pressure applied.  D/c Instructions-Education: Discharge instructions given to patient/family with verbalized understanding. D/c education completed with patient/family including follow up instructions, medication list, d/c activities limitations if indicated, with other d/c instructions as indicated by MD - patient able to verbalize understanding, all questions fully answered. Patient instructed to return to ED, call 911, or call MD for any changes in condition.  Patient escorted via [place], and D/C home via private auto.  Hassan Rowan, RN 07/10/2019 5:19 PM

## 2019-07-10 NOTE — Progress Notes (Signed)
Rx sent to pharmacy   

## 2019-07-10 NOTE — Progress Notes (Signed)
Spoke with RN Lauri and made her aware that she could DC the midline

## 2019-07-10 NOTE — TOC Transition Note (Signed)
Transition of Care [facility]) - CM/SW Discharge Note   Patient Details  Name: Katherine Herrera MRN: [id number] Date of Birth: 09-21-1966  Transition of Care [facility]) CM/SW Contact:  Benard Halsted, LCSW Phone Number: 07/10/2019, 3:01 PM   Clinical Narrative:    [name] has accepted patient for Home health PT and can begin services on 9/12. Patient aware.    Final next level of care: [place] Barriers to Discharge: Continued Medical Work up   Patient Goals and CMS Choice Patient states their goals for this hospitalization and ongoing recovery are:: Rehab CMS Medicare.gov Compare Post Acute Care list provided to:: Patient Choice offered to / list presented to : Patient  Discharge Placement                       Discharge Plan and Services In-house Referral: Clinical Social Work Discharge Planning Services: CM Consult Post Acute Care Choice: Home Health, Durable Medical Equipment          DME Arranged: Walker rolling DME Agency: [employer]       HH Arranged: RN, PT          Social Determinants of Health (SDOH) Interventions     Readmission Risk Interventions No flowsheet data found.

## 2019-07-10 NOTE — Progress Notes (Signed)
Katherine Herrera ws talking to Dr. March Rummage on telephone.  She repeated that he told her that wound vac dressing will not ned to be changed before her appt with him on Friday.  She is to bring dressing supply changes to office visit.  Pt has a clear understanding of this.

## 2019-07-10 NOTE — Telephone Encounter (Signed)
Dr.  March Rummage states in [place], Hydromorphone is to replace the Hydrocodone and pt only takes clonazepam as needed. I spoke with Merrilee Seashore - [facility] and informed of Dr. Eleanora Neighbor statement.

## 2019-07-10 NOTE — Progress Notes (Signed)
Physical Therapy Treatment Patient Details Name: Katherine Herrera MRN: [id number] DOB: 1966-10-20 Today's Date: 07/10/2019    History of Present Illness 53 yo female with onset of R foot ulcer and cellulitis was admitted to hosp from her PCP's office.  Pt is now seen with R 5th metatarsal resection, has peroneal tendonesis and placement of a wound vac.  PMHx:   DM, COPD, hypothyroidism, anxiety, tarsal tunnel syndrome, tachycardia, conversion syncope, PN, Gout,    PT Comments    Pt did well moving around hospital room with her RW and CAM boot.  Educated pt on promoting healing and safety at home with RW and boot.  Pt feels ready to go home.  I agree with [facility] services at DC.  Follow Up Recommendations  Home health PT;Supervision - Intermittent     Equipment Recommendations  (Pt has her RW for home - I adjusted for her)    Recommendations for Other Services       Precautions / Restrictions Precautions Precautions: Fall Precaution Comments: cam boot on at all times.  talked to pt about wearing other shoe too for left foot protection and more equal shoe height. Restrictions Weight Bearing Restrictions: No RLE Weight Bearing: Weight bearing as tolerated    Mobility  Bed Mobility               General bed mobility comments: pt sitting up in [place] when i arrived  Transfers Overall transfer level: Needs assistance Equipment used: Rolling walker (2 wheeled) Transfers: Sit to/from Stand   Stand pivot transfers: Supervision       General transfer comment: pt needed verbal education for safety.  pt tried to leave RW and walk a few steps to chair at end of walk - educated her on keeping RW with her all the time.  Ambulation/Gait Ambulation/Gait assistance: Min guard Gait Distance (Feet): 30 Feet Assistive device: Rolling walker (2 wheeled) Gait Pattern/deviations: Shuffle;Decreased stride length;Wide base of support;Step-to pattern     General Gait Details: pt safe with  walking with RW.  Pt tends to lean on RW - kyphotic posture.  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[place] (Stroke Patients Only)       Balance               Standing balance comment: pt felt safe standing without RW but encouraged her to keep it with her at all times for safety                            Cognition Arousal/Alertness: Awake/alert Behavior During Therapy: [facility] for tasks assessed/performed Overall Cognitive Status: Within Functional Limits for tasks assessed                                        Exercises Other Exercises Other Exercises: Talked to pt about exercises she can do with arms and other leg to help keep them strong as she heals with CAM boot.    General Comments General comments (skin integrity, edema, etc.): Educated pt on promoting healing - elevating when not up - talked about techniques, improved eating/looking at food labels to improve AIC, and continuing not smoking at DC (it has been 1 1/2 weeks since  last cigarette and she plans to not start back).      Pertinent Vitals/Pain Pain Assessment: 0-10 Pain Score: 3  Pain Location: R foot after walking Pain Intervention(s): Monitored during session    Home Living                      Prior Function            PT Goals (current goals can now be found in the care plan section) Progress towards PT goals: Progressing toward goals    Frequency    Min 3X/week      PT Plan Current plan remains appropriate    Co-evaluation              AM-PAC PT "6 Clicks" Mobility   Outcome Measure  Help needed turning from your back to your side while in a flat bed without using bedrails?: None Help needed moving from lying on your back to sitting on the side of a flat bed without using bedrails?: None Help needed moving to and from a bed to a chair (including a  wheelchair)?: A Little Help needed standing up from a chair using your arms (e.g., wheelchair or bedside chair)?: A Little Help needed to walk in hospital room?: A Little Help needed climbing 3-5 steps with a railing? : A Little 6 Click Score: 20    End of Session Equipment Utilized During Treatment: Gait belt Activity Tolerance: Patient tolerated treatment well Patient left: in chair;with call bell/phone within reach Nurse Communication: Mobility status PT Visit Diagnosis: Unsteadiness on feet (R26.81);Muscle weakness (generalized) (M62.81);Pain Pain - Right/Left: Right Pain - part of body: Ankle and joints of foot     Time: 1140-1205 PT Time Calculation (min) (ACUTE ONLY): 25 min  Charges:  $Gait Training: 23-37 mins                     07/10/2019   Rande Lawman, PT    Loyal Buba 07/10/2019, 12:13 PM

## 2019-07-10 NOTE — Discharge Summary (Signed)
Physician Discharge Summary  Katherine Herrera TUU:[id number] DOB: April 24, 1966 DOA: 07/04/2019  PCP: Cassandria Anger, MD  Admit date: 07/04/2019 Discharge date: 07/10/2019  Admitted From: Home Disposition: Home  Recommendations for Outpatient Follow-up:  1. Follow up with PCP in 1 week with repeat CBC/BMP 2. Follow-up with podiatry/Dr. March Rummage at earliest convenience.  Wound care as per Dr. Eleanora Neighbor recommendations 3. Follow up in ED if symptoms worsen or new appear   Home Health: PT/RN Equipment/Devices: Wound VAC  Discharge Condition: Stable CODE STATUS: Full Diet recommendation: Heart healthy/carb modified  Brief/Interim Summary: 53 year old female with history of diabetes mellitus type 2, hyperlipidemia, hypothyroidism, COPD was sent for direct admission by podiatry for right foot ulceration with cellulitis and possible osteomyelitis.  She underwent fifth metatarsal resection with application of wound VAC on 07/05/2019 by podiatry.  She has been on broad-spectrum antibiotics.  Cultures negative so far.  She is hemodynamically stable.  PT recommended CIR versus SNF.  Patient apparently is not a candidate for CIR.  Patient refused SNF.  She will be discharged home with home health on oral antibiotics and wound VAC with outpatient follow-up with podiatry.  Discharge Diagnoses:   Diabetic right foot ulceration with cellulitis with concern for osteomyelitis of fifth metatarsal of right foot -MRI of right foot suspicious for early osteomyelitis -Status post fifth metatarsal resection, wound excision and closure, application of wound VAC on 07/05/2019 by podiatry -Cultures negative so far.  Currently afebrile with very minimal leukocytosis -Currently on IV vancomycin and Rocephin -Hemodynamically stable.   PT recommended CIR versus SNF.  Patient apparently is not a candidate for CIR.  Patient refused SNF.  She will be discharged home with home health on oral Augmentin and doxycycline for another  10 days and wound VAC with outpatient follow-up with podiatry. -Wound care/wound VAC care as per podiatry.  Discharge pain management/prescription as per podiatry. -Continue bowel regimen  AKI -Resolved with hydration.  Stable.  Resume lisinopril  Diabetes mellitus type 2 uncontrolled with hyperglycemia -A1c 8.1 -Resume home regimen.  Outpatient follow-up.  Carb modified diet  Hypertension -Blood pressure stable.  Resume lisinopril.  Outpatient follow-up  Chronic back pain/peripheral neuropathy -Resume home baclofen, gabapentin  Anxiety -Continue home Klonopin as needed  Seizure disorder/RLS -Continue home Lamictal and Requip  Tobacco abuse--outpatient follow-up.  Patient needs to quit smoking.  Obesity -Outpatient follow-up  Discharge Instructions  Discharge Instructions    Diet - low sodium heart healthy   Complete by: As directed    Diet Carb Modified   Complete by: As directed    Increase activity slowly   Complete by: As directed      Allergies as of 07/10/2019      Reactions   Hydromorphone Hcl Itching   Patient has been tolerating Hydromorphone tablets without adverse effect (07/09/19)   Cephalexin Nausea And Vomiting   Demerol  [meperidine Hcl] Rash   Lovastatin Other (See Comments)   Other reaction(s): Unknown (See Comments) Possible myalgia Unknown    Metformin And Related Nausea And Vomiting   Sulfamethoxazole-trimethoprim    Other reaction(s): Unknown DILI, pancreatitis   Crestor [rosuvastatin Calcium]    myalgia   Niacin Other (See Comments)   Unknown    Paroxetine Other (See Comments)   Other reaction(s): Unknown (See Comments) unknown Unknown       Medication List    STOP taking these medications   docusate sodium 100 MG capsule Commonly known as: COLACE   oxyCODONE 15 MG immediate release tablet Commonly  known as: ROXICODONE     TAKE these medications   accu-chek softclix lancets Test two times daily   amoxicillin-clavulanate  875-125 MG tablet Commonly known as: Augmentin Take 1 tablet by mouth 2 (two) times daily for 10 days.   aspirin 325 MG tablet Commonly known as: Bayer Aspirin Take 1 tablet (325 mg total) by mouth daily.   B-D INS SYR ULTRAFINE 1CC/31G 31G X 5/16" 1 ML Misc Generic drug: Insulin Syringe-Needle U-100 USE AS DIRECTED   Insulin Syringe-Needle U-100 31G X 5/16" 0.5 ML Misc Commonly known as: Sure Comfort Insulin Syringe USE AS DIRECTED WITH INSULIN 4 TIMES A DAY   baclofen 10 MG tablet Commonly known as: LIORESAL Take 2 tablets (20 mg total) by mouth 2 (two) times daily.   cholecalciferol 25 MCG (1000 UT) tablet Commonly known as: VITAMIN D Take 2 tablets (2,000 Units total) by mouth daily.   clonazePAM 0.5 MG tablet Commonly known as: KLONOPIN Take 1 tablet (0.5 mg total) by mouth 2 (two) times daily as needed for anxiety.   doxycycline 100 MG tablet Commonly known as: VIBRA-TABS Take 1 tablet (100 mg total) by mouth 2 (two) times daily for 10 days.   ezetimibe 10 MG tablet Commonly known as: ZETIA Take 1 tablet (10 mg total) by mouth daily.   gabapentin 800 MG tablet Commonly known as: NEURONTIN Take 1 tablet (800 mg total) by mouth 4 (four) times daily.   glucose blood test strip Commonly known as: OneTouch Verio Use to check blood sugar 2 times per day.   insulin NPH-regular Human (70-30) 100 UNIT/ML injection Inject 70 Units into the skin 2 (two) times daily with a meal.   lamoTRIgine 25 MG tablet Commonly known as: LAMICTAL TAKE 1 TABLET BY MOUTH TWO  TIMES DAILY   levothyroxine 75 MCG tablet Commonly known as: SYNTHROID TAKE 1 TABLET BY MOUTH  DAILY   lisinopril 10 MG tablet Commonly known as: ZESTRIL Take 1 tablet (10 mg total) by mouth daily.   loratadine 10 MG tablet Commonly known as: CLARITIN Take 10 mg by mouth daily as needed for allergies.   OneTouch Verio w/Device Kit   polyethylene glycol 17 g packet Commonly known as: MIRALAX /  GLYCOLAX Take 17 g by mouth daily as needed for moderate constipation.   rOPINIRole 0.5 MG tablet Commonly known as: REQUIP Take 1 tablet (0.5 mg total) by mouth at bedtime.   senna-docusate 8.6-50 MG tablet Commonly known as: Senokot-S Take 1 tablet by mouth 2 (two) times daily.   vitamin B-12 1000 MCG tablet Commonly known as: CYANOCOBALAMIN Take 1,000 mcg by mouth daily.      Follow-up Information    Plotnikov, [name], MD. Schedule an appointment as soon as possible for a visit in 1 week(s).   Specialty: Internal Medicine Why: With repeat CBC/BMP Contact information: [place] [id number] [phone]        Evelina Bucy, DPM Follow up.   Specialty: Podiatry Why: At earliest convenience  Contact information: [address] [place] [address] [phone]          Allergies  Allergen Reactions  . Hydromorphone Hcl Itching    Patient has been tolerating Hydromorphone tablets without adverse effect (07/09/19)  . Cephalexin Nausea And Vomiting  . Demerol  [Meperidine Hcl] Rash  . Lovastatin Other (See Comments)    Other reaction(s): Unknown (See Comments) Possible myalgia Unknown   . Metformin And Related Nausea And Vomiting  .  Sulfamethoxazole-Trimethoprim     Other reaction(s): Unknown DILI, pancreatitis  . Crestor [Rosuvastatin Calcium]     myalgia  . Niacin Other (See Comments)    Unknown   . Paroxetine Other (See Comments)    Other reaction(s): Unknown (See Comments) unknown Unknown     Consultations:  Podiatry   Procedures/Studies: Mr Foot Right Wo Contrast  Result Date: 07/05/2019 CLINICAL DATA:  Diabetic with right foot ulcer and cellulitis. Limited response to antibiotics. Clinical concern of osteomyelitis. EXAM: MRI OF THE RIGHT FOREFOOT WITHOUT CONTRAST TECHNIQUE: Multiplanar, multisequence MR imaging of the right forefoot was performed. No intravenous contrast was administered. COMPARISON:  Radiographs  06/20/2019, 04/02/2019 and 01/02/2019. FINDINGS: Bones/Joint/Cartilage Patient underwent resection of the 5th metatarsal head on 01/02/2019. The amputation site appears sharp. There is periosteal thickening involving the base of the 5th metatarsal which appears chronic. Low-level marrow T2 hyperintensity is present within the base of the 5th metatarsal without corresponding T1 signal abnormality or cortical destruction. There are overlying soft tissue changes with inflammation and possible soft tissue emphysema in this area. There is mild T2 hyperintensity laterally in the cuboid without corresponding T1 signal abnormality or cortical destruction. Apparent arthropathy at the 2nd DIP joint. No other focal osseous lesions are identified. There are mild degenerative changes in the midfoot. Ligaments Intact Lisfranc ligament. Muscles and Tendons Diffuse forefoot muscular atrophy without focal fluid collection. No significant tenosynovitis. Soft tissues There is skin irregularity and ulceration along the plantar and lateral aspect of the 5th metatarsal base. There are inflammatory changes and possible soft tissue emphysema in the subcutaneous fat. There is no well-defined fluid collection. As noted above, the soft tissue changes are adjacent to marrow changes in the base of the 5th metatarsal. IMPRESSION: 1. Soft tissue ulceration along the plantar and lateral aspect of the 5th metatarsal base with underlying inflammatory changes and possible soft tissue emphysema. These findings are in close proximity to T2 marrow changes in the base of the 5th metatarsal, suspicious for early osteomyelitis. There is no cortical destruction. 2. 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 3. Apparent arthropathy at the 2nd DIP joint. Electronically Signed   By: Richardean Sale M.D.   On: 07/05/2019 12:46   Dg Foot 2 Views Right  Result Date: 07/05/2019 CLINICAL DATA:  Status post right fifth metatarsal resection, peroneal  tenodesis, ulcer repair EXAM: RIGHT FOOT - 2 VIEW COMPARISON:  06/24/2019 FINDINGS: Redemonstrated postoperative findings of distal right fifth metatarsal amputation with new overlying skin staples about the base of the right fifth metatarsal. There is soft tissue edema about the midfoot and ankle. Mild right first metatarsophalangeal arthrosis. IMPRESSION: Redemonstrated postoperative findings of distal right fifth metatarsal amputation with new overlying skin staples about the base of the right fifth metatarsal. There is soft tissue edema about the midfoot and ankle. Electronically Signed   By: Eddie Candle M.D.   On: 07/05/2019 16:45   Dg Foot Complete Right  Result Date: 07/01/2019 Please see detailed radiograph report in office note.  Vas [place] Lower Extremity Venous (dvt)  Result Date: 07/08/2019  Lower Venous Study Indications: Pain. Other Indications: Diabetic ulcer with necrosis of bone, osteomyelitis. Limitations: Body habitus, edema, poor ultrasound/tissue interface and orthopaedic appliance. Comparison Study: Prior negative left lower extremity venous duplex from 05/12/17                   is available for comparison. Performing Technologist: Sharion Dove RVS  Examination Guidelines: A complete evaluation includes B-mode imaging, spectral  Doppler, color Doppler, and power Doppler as needed of all accessible portions of each vessel. Bilateral testing is considered an integral part of a complete examination. Limited examinations for reoccurring indications may be performed as noted.  +---------+---------------+---------+-----------+----------+------------------+ RIGHT    CompressibilityPhasicitySpontaneityPropertiesThrombus Aging     +---------+---------------+---------+-----------+----------+------------------+ CFV      Full           Yes      Yes                                     +---------+---------------+---------+-----------+----------+------------------+ SFJ      Full                                                             +---------+---------------+---------+-----------+----------+------------------+ FV Prox  Full                                                            +---------+---------------+---------+-----------+----------+------------------+ FV Mid   Full                                                            +---------+---------------+---------+-----------+----------+------------------+ FV DistalFull                                                            +---------+---------------+---------+-----------+----------+------------------+ PFV      Full                                                            +---------+---------------+---------+-----------+----------+------------------+ POP                     Yes      Yes                  Patent by color                                                          Doppler            +---------+---------------+---------+-----------+----------+------------------+ PTV  Not visualized     +---------+---------------+---------+-----------+----------+------------------+ PERO                                                  Not visualized     +---------+---------------+---------+-----------+----------+------------------+   +----+---------------+---------+-----------+----------+--------------+ LEFTCompressibilityPhasicitySpontaneityPropertiesThrombus Aging +----+---------------+---------+-----------+----------+--------------+ CFV Full           Yes      Yes                                 +----+---------------+---------+-----------+----------+--------------+     Summary: Right: There is no evidence of deep vein thrombosis in the lower extremity. However, portions of this examination were limited- see technologist comments above. Left: No evidence of common femoral vein obstruction.  *See table(s) above for  measurements and observations. Electronically signed by Harold Barban MD on 07/08/2019 at 1:11:38 AM.    Final        Subjective: Patient seen and examined at bedside.  She denies any overnight fever, nausea or vomiting.  Denies worsening foot pain.  Discharge Exam: Vitals:   07/09/19 2305 07/10/19 0557  BP: (!) 150/91 111/65  Pulse: 79 72  Resp: 19   Temp: 98.8 F (37.1 C) 98.2 F (36.8 C)  SpO2: 96% 92%    General: Pt is alert, awake, not in acute distress Cardiovascular: rate controlled, S1/S2 + Respiratory: bilateral decreased breath sounds at bases Abdominal: Soft, NT, ND, bowel sounds + Extremities: Right foot dressing present with wound VAC.    The results of significant diagnostics from this hospitalization (including imaging, microbiology, ancillary and laboratory) are listed below for reference.     Microbiology: Recent Results (from the past 240 hour(s))  MRSA PCR Screening     Status: None   Collection Time: 07/05/19 12:46 AM   Specimen: Nasopharyngeal  Result Value Ref Range Status   MRSA by PCR NEGATIVE NEGATIVE Final    Comment:        The GeneXpert MRSA Assay (FDA approved for NASAL specimens only), is one component of a comprehensive MRSA colonization surveillance program. It is not intended to diagnose MRSA infection nor to guide or monitor treatment for MRSA infections. Performed at [facility], [place] [address], [place], [place] [address]   SARS CORONAVIRUS 2 (TAT 6-24 HRS) Nasopharyngeal Nasopharyngeal Swab     Status: None   Collection Time: 07/05/19 12:46 AM   Specimen: Nasopharyngeal Swab  Result Value Ref Range Status   SARS Coronavirus 2 NEGATIVE NEGATIVE Final    Comment: (NOTE) SARS-CoV-2 target nucleic acids are NOT DETECTED. The SARS-CoV-2 RNA is generally detectable in upper and lower respiratory specimens during the acute phase of infection. Negative results do not preclude SARS-CoV-2 infection, do not rule  out co-infections with other pathogens, and should not be used as the sole basis for treatment or other patient management decisions. Negative results must be combined with clinical observations, patient history, and epidemiological information. The expected result is Negative. Fact Sheet for Patients: SugarRoll.be Fact Sheet for Healthcare Providers: https://www.woods-mathews.com/ This test is not yet approved or cleared by the [place] FDA and  has been authorized for detection and/or diagnosis of SARS-CoV-2 by FDA under an Emergency Use Authorization (EUA). This EUA will remain  in effect (meaning this test can be used) for the duration of  the COVID-19 declaration under Section 56 4(b)(1) of the Act, 21 U.S.C. section 360bbb-3(b)(1), unless the authorization is terminated or revoked sooner. Performed at [facility], [place] [address] [place], [place] [address]   Aerobic/Anaerobic Culture (surgical/deep wound)     Status: None (Preliminary result)   Collection Time: 07/05/19  1:17 PM   Specimen: Foot, Right; Tissue  Result Value Ref Range Status   Specimen Description BONE  Final   Special Requests RIGHT FOOT SAMPLE A METATARSAL CLEAN MARGIN  Final   Gram Stain NO WBC SEEN NO ORGANISMS SEEN   Final   Culture   Final    NO GROWTH 4 DAYS NO ANAEROBES ISOLATED; CULTURE IN PROGRESS FOR 5 DAYS Performed at [facility], [place] [address] [place], [place] [address]    Report Status PENDING  Incomplete  Aerobic/Anaerobic Culture (surgical/deep wound)     Status: None (Preliminary result)   Collection Time: 07/05/19  1:46 PM   Specimen: Foot, Right; Tissue  Result Value Ref Range Status   Specimen Description FOOT  Final   Special Requests RIGHT  Final   Gram Stain   Final    RARE WBC PRESENT, PREDOMINANTLY PMN NO ORGANISMS SEEN Performed at [facility], [place] [address] [place], [place] [address]    Culture   Final     RARE NORMAL SKIN FLORA NO ANAEROBES ISOLATED; CULTURE IN PROGRESS FOR 5 DAYS    Report Status PENDING  Incomplete  Culture, blood (routine x 2)     Status: None   Collection Time: 07/05/19  4:36 PM   Specimen: BLOOD RIGHT ARM  Result Value Ref Range Status   Specimen Description BLOOD RIGHT ARM  Final   Special Requests   Final    BOTTLES DRAWN AEROBIC ONLY Blood Culture adequate volume   Culture   Final    NO GROWTH 5 DAYS Performed at [facility], [place] [address] [place], [place] [address]    Report Status 07/10/2019 FINAL  Final  Culture, blood (routine x 2)     Status: None   Collection Time: 07/05/19  4:36 PM   Specimen: BLOOD RIGHT ARM  Result Value Ref Range Status   Specimen Description BLOOD RIGHT ARM  Final   Special Requests   Final    BOTTLES DRAWN AEROBIC ONLY Blood Culture adequate volume   Culture   Final    NO GROWTH 5 DAYS Performed at [facility], [place] [address], [place], [place] [address]    Report Status 07/10/2019 FINAL  Final     Labs: BNP (last 3 results) Recent Labs    09/28/18 2112  BNP 58.8   Basic Metabolic Panel: Recent Labs  Lab 07/06/19 0336 07/07/19 0328 07/08/19 0353 07/09/19 0358 07/10/19 0425  NA 133* 139 137 138 137  K 5.0 4.9 5.0 4.5 4.7  CL 102 108 105 102 101  CO2 '24 25 26 26 27  ' GLUCOSE 375* 252* 156* 159* 158*  BUN 25* '19 14 13 11  ' CREATININE 1.25* 1.09* 0.99 0.97 0.97  CALCIUM 8.0* 8.1* 8.4* 8.7* 9.0   Liver Function Tests: Recent Labs  Lab 07/05/19 0243  AST 16  ALT 13  ALKPHOS 94  BILITOT 0.4  PROT 6.4*  ALBUMIN 2.8*   No results for input(s): LIPASE, AMYLASE in the last 168 hours. No results for input(s): AMMONIA in the last 168 hours. CBC: Recent Labs  Lab 07/06/19 0336 07/07/19 0328 07/08/19 0353  07/09/19 0358 07/10/19 0425  WBC 12.2* 9.8 10.9* 11.0* 11.2*  NEUTROABS 8.7* 5.9 7.0 7.2 6.9  HGB 12.5 12.4 12.7 13.4 13.2  HCT 39.6 39.2 40.1 41.7 40.8  MCV 89.4 89.1 88.7 87.2 86.8   PLT 281 265 283 280 286   Cardiac Enzymes: No results for input(s): CKTOTAL, CKMB, CKMBINDEX, TROPONINI in the last 168 hours. BNP: Invalid input(s): POCBNP CBG: Recent Labs  Lab 07/09/19 0804 07/09/19 1207 07/09/19 1653 07/09/19 2256 07/10/19 0751  GLUCAP 181* 338* 247* 199* 164*   D-Dimer No results for input(s): DDIMER in the last 72 hours. Hgb A1c No results for input(s): HGBA1C in the last 72 hours. Lipid Profile No results for input(s): CHOL, HDL, LDLCALC, TRIG, CHOLHDL, LDLDIRECT in the last 72 hours. Thyroid function studies No results for input(s): TSH, T4TOTAL, T3FREE, THYROIDAB in the last 72 hours.  Invalid input(s): FREET3 Anemia work up No results for input(s): VITAMINB12, FOLATE, FERRITIN, TIBC, IRON, RETICCTPCT in the last 72 hours. Urinalysis    Component Value Date/Time   COLORURINE YELLOW 02/20/2019 1330   APPEARANCEUR CLEAR 02/20/2019 1330   LABSPEC 1.010 02/20/2019 1330   PHURINE 5.5 02/20/2019 1330   GLUCOSEU NEGATIVE 02/20/2019 1330   HGBUR NEGATIVE 02/20/2019 1330   BILIRUBINUR NEGATIVE 02/20/2019 1330   KETONESUR NEGATIVE 02/20/2019 1330   PROTEINUR NEGATIVE 09/28/2018 2318   UROBILINOGEN 0.2 02/20/2019 1330   NITRITE NEGATIVE 02/20/2019 1330   LEUKOCYTESUR NEGATIVE 02/20/2019 1330   Sepsis Labs Invalid input(s): PROCALCITONIN,  WBC,  LACTICIDVEN Microbiology Recent Results (from the past 240 hour(s))  MRSA PCR Screening     Status: None   Collection Time: 07/05/19 12:46 AM   Specimen: Nasopharyngeal  Result Value Ref Range Status   MRSA by PCR NEGATIVE NEGATIVE Final    Comment:        The GeneXpert MRSA Assay (FDA approved for NASAL specimens only), is one component of a comprehensive MRSA colonization surveillance program. It is not intended to diagnose MRSA infection nor to guide or monitor treatment for MRSA infections. Performed at [facility], [place] [address] [place], [place] [address]   SARS CORONAVIRUS 2 (TAT  6-24 HRS) Nasopharyngeal Nasopharyngeal Swab     Status: None   Collection Time: 07/05/19 12:46 AM   Specimen: Nasopharyngeal Swab  Result Value Ref Range Status   SARS Coronavirus 2 NEGATIVE NEGATIVE Final    Comment: (NOTE) SARS-CoV-2 target nucleic acids are NOT DETECTED. The SARS-CoV-2 RNA is generally detectable in upper and lower respiratory specimens during the acute phase of infection. Negative results do not preclude SARS-CoV-2 infection, do not rule out co-infections with other pathogens, and should not be used as the sole basis for treatment or other patient management decisions. Negative results must be combined with clinical observations, patient history, and epidemiological information. The expected result is Negative. Fact Sheet for Patients: SugarRoll.be Fact Sheet for Healthcare Providers: https://www.woods-mathews.com/ This test is not yet approved or cleared by the [place] FDA and  has been authorized for detection and/or diagnosis of SARS-CoV-2 by FDA under an Emergency Use Authorization (EUA). This EUA will remain  in effect (meaning this test can be used) for the duration of the COVID-19 declaration under Section 56 4(b)(1) of the Act, 21 U.S.C. section 360bbb-3(b)(1), unless the authorization is terminated or revoked sooner. Performed at [facility], [place] [address] [place], [place] [address]   Aerobic/Anaerobic Culture (surgical/deep wound)     Status: None (Preliminary result)   Collection Time:  07/05/19  1:17 PM   Specimen: Foot, Right; Tissue  Result Value Ref Range Status   Specimen Description BONE  Final   Special Requests RIGHT FOOT SAMPLE A METATARSAL CLEAN MARGIN  Final   Gram Stain NO WBC SEEN NO ORGANISMS SEEN   Final   Culture   Final    NO GROWTH 4 DAYS NO ANAEROBES ISOLATED; CULTURE IN PROGRESS FOR 5 DAYS Performed at [facility], [address] [place], [place] [address]     Report Status PENDING  Incomplete  Aerobic/Anaerobic Culture (surgical/deep wound)     Status: None (Preliminary result)   Collection Time: 07/05/19  1:46 PM   Specimen: Foot, Right; Tissue  Result Value Ref Range Status   Specimen Description FOOT  Final   Special Requests RIGHT  Final   Gram Stain   Final    RARE WBC PRESENT, PREDOMINANTLY PMN NO ORGANISMS SEEN Performed at [facility], [place] [address] [place], [place] [address]    Culture   Final    RARE NORMAL SKIN FLORA NO ANAEROBES ISOLATED; CULTURE IN PROGRESS FOR 5 DAYS    Report Status PENDING  Incomplete  Culture, blood (routine x 2)     Status: None   Collection Time: 07/05/19  4:36 PM   Specimen: BLOOD RIGHT ARM  Result Value Ref Range Status   Specimen Description BLOOD RIGHT ARM  Final   Special Requests   Final    BOTTLES DRAWN AEROBIC ONLY Blood Culture adequate volume   Culture   Final    NO GROWTH 5 DAYS Performed at [facility], [place] [address] [place], [place] [address]    Report Status 07/10/2019 FINAL  Final  Culture, blood (routine x 2)     Status: None   Collection Time: 07/05/19  4:36 PM   Specimen: BLOOD RIGHT ARM  Result Value Ref Range Status   Specimen Description BLOOD RIGHT ARM  Final   Special Requests   Final    BOTTLES DRAWN AEROBIC ONLY Blood Culture adequate volume   Culture   Final    NO GROWTH 5 DAYS Performed at [facility], [place] [address], [place], [place] [address]    Report Status 07/10/2019 FINAL  Final     Time coordinating discharge: 35 minutes  SIGNED:   Aline August, MD  [facility] 07/10/2019, 9:13 AM

## 2019-07-10 NOTE — Telephone Encounter (Signed)
[facility] - [facility] staff called states they received rx for hydromorphone and it appears to be replacing hydrocodone and pti is also on Clonazepam, needs clarification of orders.

## 2019-07-11 ENCOUNTER — Telehealth: Payer: Self-pay | Admitting: *Deleted

## 2019-07-11 NOTE — Telephone Encounter (Signed)
Called pt to make hosp f/u appt w/PCP. Pt states she doesn't need to see Dr. Alain Marion. She was admitted due to her foot problem, and her podiatrist did her surgery on he foot. Pt states she is actually following up w/Dr. March Rummage on tomorrow. Will keep her follow-up appt that's scheduled in Dec w/Plotnikov.Marland KitchenJohny Chess

## 2019-07-12 ENCOUNTER — Telehealth: Payer: Self-pay | Admitting: *Deleted

## 2019-07-12 ENCOUNTER — Other Ambulatory Visit: Payer: Self-pay

## 2019-07-12 ENCOUNTER — Ambulatory Visit (INDEPENDENT_AMBULATORY_CARE_PROVIDER_SITE_OTHER): Payer: Medicare Other | Admitting: Podiatry

## 2019-07-12 DIAGNOSIS — L97511 Non-pressure chronic ulcer of other part of right foot limited to breakdown of skin: Secondary | ICD-10-CM

## 2019-07-12 MED ORDER — HYDROMORPHONE HCL 2 MG PO TABS: 6.0000 mg | ORAL_TABLET | ORAL | 0 refills | Status: DC | PRN

## 2019-07-12 MED ORDER — HYDROMORPHONE HCL 4 MG PO TABS: 6.0000 mg | ORAL_TABLET | Freq: Every day | ORAL | 0 refills | Status: DC | PRN

## 2019-07-12 NOTE — Telephone Encounter (Signed)
[facility] - [facility] states Hydromorphone 2 mg needs PA.

## 2019-07-12 NOTE — Telephone Encounter (Signed)
I spoke with [facility] - pharmacist-Kim and she states Hydromorphone 2mg  3 tablets every 4 hours = 18, which exceeds the number of pills recommended per day of 8 pills/day of morphine recommended and requires Provider to contact insurance for PA [phone].

## 2019-07-12 NOTE — Telephone Encounter (Signed)
Left message informing pt Dr. March Rummage had ordered Hydromorphone and to pay strict attention to the new instructions.

## 2019-07-13 DIAGNOSIS — L03115 Cellulitis of right lower limb: Secondary | ICD-10-CM | POA: Diagnosis not present

## 2019-07-13 DIAGNOSIS — Z4781 Encounter for orthopedic aftercare following surgical amputation: Secondary | ICD-10-CM | POA: Diagnosis not present

## 2019-07-13 DIAGNOSIS — E1142 Type 2 diabetes mellitus with diabetic polyneuropathy: Secondary | ICD-10-CM | POA: Diagnosis not present

## 2019-07-13 DIAGNOSIS — E11621 Type 2 diabetes mellitus with foot ulcer: Secondary | ICD-10-CM | POA: Diagnosis not present

## 2019-07-13 DIAGNOSIS — L97518 Non-pressure chronic ulcer of other part of right foot with other specified severity: Secondary | ICD-10-CM | POA: Diagnosis not present

## 2019-07-15 ENCOUNTER — Telehealth: Payer: Self-pay | Admitting: Podiatry

## 2019-07-15 NOTE — Telephone Encounter (Signed)
Pt states the wound vac only had one light for the days on Friday, but Dr. March Rummage thought it would be okay. I told pt that we could get her in tomorrow, and to call the troubleshooting number. Pt is scheduled with Dr. Cannon Kettle 4:15pm tomorrow.

## 2019-07-15 NOTE — Telephone Encounter (Signed)
You had a new wound vac put on Friday and it's made weird sounds and then it shut down. Called the on-call doctor told her to call the office on Monday. She doesn't know what to do and if she needs to come in. The on-call doctor said it would be fine for the weekend.

## 2019-07-16 ENCOUNTER — Ambulatory Visit (INDEPENDENT_AMBULATORY_CARE_PROVIDER_SITE_OTHER): Payer: Self-pay | Admitting: Sports Medicine

## 2019-07-16 ENCOUNTER — Encounter: Payer: Self-pay | Admitting: Sports Medicine

## 2019-07-16 ENCOUNTER — Other Ambulatory Visit: Payer: Self-pay

## 2019-07-16 DIAGNOSIS — E1142 Type 2 diabetes mellitus with diabetic polyneuropathy: Secondary | ICD-10-CM

## 2019-07-16 DIAGNOSIS — L97511 Non-pressure chronic ulcer of other part of right foot limited to breakdown of skin: Secondary | ICD-10-CM

## 2019-07-16 DIAGNOSIS — L03115 Cellulitis of right lower limb: Secondary | ICD-10-CM | POA: Diagnosis not present

## 2019-07-16 DIAGNOSIS — L97518 Non-pressure chronic ulcer of other part of right foot with other specified severity: Secondary | ICD-10-CM | POA: Diagnosis not present

## 2019-07-16 DIAGNOSIS — M21961 Unspecified acquired deformity of right lower leg: Secondary | ICD-10-CM

## 2019-07-16 DIAGNOSIS — Z4781 Encounter for orthopedic aftercare following surgical amputation: Secondary | ICD-10-CM | POA: Diagnosis not present

## 2019-07-16 DIAGNOSIS — E11621 Type 2 diabetes mellitus with foot ulcer: Secondary | ICD-10-CM | POA: Diagnosis not present

## 2019-07-16 DIAGNOSIS — Z9889 Other specified postprocedural states: Secondary | ICD-10-CM

## 2019-07-16 NOTE — Progress Notes (Signed)
Subjective: Katherine Herrera is a 53 y.o. female patient seen today in office for post op wound vac check. Patient is s/p RIGHT FOOT 5TH METATARSAL RESECTION; PERONEAL TENDONESIS, EXCISION OF ULCER, LOCAL TISSUE TRANSFER performed by DR. PRICE on 9/4, denies calf pain, denies headache, chest pain, shortness of breath, nausea, vomiting, fever, or chills. Patient states that she noticed on Saturday that the wound VAC stopped working.  Patient reports that she had an home nurse come to visit her today and has been going several days with the wound VAC not operating.  Patient reports that she looked up the information about her wound VAC and her wound VAC is supposed to go dead after 7 days but reports that her home nurse did not know this and was unsure how to proceed and since she was coming to office today did not change the dressing.  Patient denies nausea vomiting fever chills or any other constitutional symptoms at this time.  No other issues noted.   Patient Active Problem List   Diagnosis Date Noted  . Peroneal tendonitis, right   . Right foot ulcer ([place]) 07/04/2019  . Capsulitis of metatarsophalangeal (MTP) joint of right foot   . Exostosis of bone of foot   . Preop exam for internal medicine 12/19/2018  . Polyneuropathy due to type 2 diabetes mellitus ([place]) 10/01/2018  . Mild renal insufficiency 09/29/2018  . Hyponatremia 09/29/2018  . Chronic pain 09/29/2018  . Cellulitis of left lower leg   . Diabetic foot ulcer ([place])   . Sepsis due to cellulitis ([place]) 09/28/2018  . Syncope 07/30/2018  . Earache on right 08/02/2017  . Cerumen impaction 08/02/2017  . Constipation due to pain medication 05/22/2017  . Elevated liver enzymes 05/22/2017  . AKI (acute kidney injury) ([place]) 05/03/2017  . Charcot foot due to diabetes mellitus ([place]) 04/23/2017  . Leg swelling 10/28/2016  . Knee contusion 01/13/2016  . Pain from implanted hardware 01/06/2016  . Cellulitis and abscess of leg 07/24/2015  .  Nausea without vomiting 07/24/2015  . Leg edema, left 06/19/2015  . DOE (dyspnea on exertion) 06/16/2015  . Chest pain, atypical 06/16/2015  . Family history of musculoskeletal disease 03/12/2015  . Non-compliant behavior 12/24/2014  . Ulcer of right foot limited to breakdown of skin ([place]) 09/02/2014  . Ulcer of foot due to diabetes mellitus ([place]) 08/12/2014  . Ulcer of other part of foot 06/20/2014  . Leg pain, left 03/31/2014  . Callus of foot 03/25/2014  . 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 Rash 01/28/2014  . Pain in lower limb 01/24/2014  . Keratosis 01/24/2014  . Leg cramps 10/09/2013  . Vaginitis and vulvovaginitis 10/09/2013  . Ingrown nail 09/30/2013  . Pain in toe of left foot 09/30/2013  . Tenosynovitis of foot and ankle 09/03/2013  . Metatarsal deformity, left 09/03/2013  . Pain in joint, ankle and foot 01/22/2013  . Pes cavus 10/13/2012  . Acquired tight Achilles tendon 10/13/2012  . Paronychia 06/14/2012  . 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 ([place]) 03/19/2012  . Allergic rhinitis 03/19/2012  . URI (upper respiratory infection) 08/30/2011  . Myalgia 04/18/2011  . SHOULDER PAIN 01/17/2011  . TTS (tarsal tunnel syndrome)   . GASTROENTERITIS 10/18/2010  . ARTHRALGIA 01/04/2010  . WEIGHT GAIN, ABNORMAL 01/04/2010  . TACHYCARDIA 10/02/2009  . CBC, ABNORMAL 05/01/2009  . B12 deficiency 01/05/2009  . GOUT 01/05/2009  . Edema 01/05/2009  . Anxiety state 07/28/2008  . ELBOW PAIN 07/28/2008  . TOBACCO USE DISORDER/SMOKER-SMOKING  CESSATION DISCUSSED 04/21/2008  . FOOT PAIN 04/21/2008  . PARESTHESIA 04/21/2008  . Abdominal pain 04/21/2008  . SINUSITIS- ACUTE-NOS 11/13/2007  . COPD exacerbation ([place]) 11/13/2007  . Diabetic neuropathy ([place]) 08/27/2007  . LOW BACK PAIN 08/27/2007  . Dyslipidemia 08/20/2007  . Hypothyroidism 05/22/2007    Current Outpatient Medications on File Prior to Visit  Medication Sig Dispense Refill  . amoxicillin-clavulanate  (AUGMENTIN) 875-125 MG tablet Take 1 tablet by mouth 2 (two) times daily for 10 days. 20 tablet 0  . aspirin (BAYER ASPIRIN) 325 MG tablet Take 1 tablet (325 mg total) by mouth daily. 100 tablet 3  . B-D INS SYR ULTRAFINE 1CC/31G 31G X 5/16" 1 ML MISC USE AS DIRECTED 100 each 0  . baclofen (LIORESAL) 10 MG tablet Take 2 tablets (20 mg total) by mouth 2 (two) times daily. 180 tablet 1  . Blood Glucose Monitoring Suppl (ONETOUCH VERIO) w/Device KIT     . cholecalciferol (VITAMIN D) 25 MCG (1000 UT) tablet Take 2 tablets (2,000 Units total) by mouth daily. 100 tablet 3  . clonazePAM (KLONOPIN) 0.5 MG tablet Take 1 tablet (0.5 mg total) by mouth 2 (two) times daily as needed for anxiety. 30 tablet 1  . doxycycline (VIBRA-TABS) 100 MG tablet Take 1 tablet (100 mg total) by mouth 2 (two) times daily for 10 days. 20 tablet 0  . ezetimibe (ZETIA) 10 MG tablet Take 1 tablet (10 mg total) by mouth daily. 90 tablet 3  . gabapentin (NEURONTIN) 800 MG tablet Take 1 tablet (800 mg total) by mouth 4 (four) times daily. 360 tablet 1  . glucose blood (ONETOUCH VERIO) test strip Use to check blood sugar 2 times per day. 100 each 12  . HYDROmorphone (DILAUDID) 4 MG tablet Take 1.5 tablets (6 mg total) by mouth 5 (five) times daily as needed for severe pain. Every 4 hours as needed for severe pain, not to exceed 5 times daily 42 tablet 0  . insulin NPH-regular Human (70-30) 100 UNIT/ML injection Inject 70 Units into the skin 2 (two) times daily with a meal.    . Insulin Syringe-Needle U-100 (SURE COMFORT INSULIN SYRINGE) 31G X 5/16" 0.5 ML MISC USE AS DIRECTED WITH INSULIN 4 TIMES A DAY 400 each 2  . lamoTRIgine (LAMICTAL) 25 MG tablet TAKE 1 TABLET BY MOUTH TWO  TIMES DAILY (Patient taking differently: Take 25 mg by mouth 2 (two) times daily. ) 180 tablet 1  . Lancet Devices (ACCU-CHEK SOFTCLIX) lancets Test two times daily 100 each 12  . levothyroxine (SYNTHROID, LEVOTHROID) 75 MCG tablet TAKE 1 TABLET BY MOUTH  DAILY  90 tablet 3  . lisinopril (ZESTRIL) 10 MG tablet Take 1 tablet (10 mg total) by mouth daily. 90 tablet 3  . loratadine (CLARITIN) 10 MG tablet Take 10 mg by mouth daily as needed for allergies.    . polyethylene glycol (MIRALAX / GLYCOLAX) 17 g packet Take 17 g by mouth daily as needed for moderate constipation. 14 each 0  . rOPINIRole (REQUIP) 0.5 MG tablet Take 1 tablet (0.5 mg total) by mouth at bedtime. 90 tablet 1  . senna-docusate (SENOKOT-S) 8.6-50 MG tablet Take 1 tablet by mouth 2 (two) times daily. 20 tablet 0  . vitamin B-12 (CYANOCOBALAMIN) 1000 MCG tablet Take 1,000 mcg by mouth daily.       No current facility-administered medications on file prior to visit.     Allergies  Allergen Reactions  . Hydromorphone Hcl Itching    Patient has  been tolerating Hydromorphone tablets without adverse effect (07/09/19)  . Cephalexin Nausea And Vomiting  . Demerol  [Meperidine Hcl] Rash  . Lovastatin Other (See Comments)    Other reaction(s): Unknown (See Comments) Possible myalgia Unknown   . Metformin And Related Nausea And Vomiting  . Sulfamethoxazole-Trimethoprim     Other reaction(s): Unknown DILI, pancreatitis  . Crestor [Rosuvastatin Calcium]     myalgia  . Niacin Other (See Comments)    Unknown   . Paroxetine Other (See Comments)    Other reaction(s): Unknown (See Comments) unknown Unknown     Objective: There were no vitals filed for this visit.  General: No acute distress, AAOx3  Right foot: Sutures and staples intact severe maceration at surgical site from sponge being left on since Saturday with wound VAC not operating, no colitis/erythema, no warmth, no drainage, very minimal malodor, no other signs of infection noted, capillary fill time within normal limits, no pain or crepitation with range of motion right foot or patient to calf.       Assessment and Plan:  Problem List Items Addressed This Visit      Other   Right foot ulcer ([place])    Other Visit  Diagnoses    Post-operative state    -  Primary   Deformity of metatarsal bone of right foot       DM type 2 with diabetic peripheral neuropathy ([place])           -Patient seen and evaluated -Prevena wound vac removed -Applied betadine and dry sterile dressing to surgical site right foot secured with ACE wrap and stockinet  -Advised patient to make sure to keep dressings clean, dry, and intact to right surgical site, ordered for home nurse to reassess wound tomorrow and come out and apply more Betadine and dry dressing until patient follows up with Dr. March Rummage on Thursday for reassessment to see if he wants to resume wound VAC versus discontinuing it -Advised patient to continue with cam boot -Advised patient to limit activity to necessity  -Advised patient to do you with postoperative medication as prescribed by Dr. March Rummage -Will plan for wound check with Dr. March Rummage  at next office visit. In the meantime, patient to call office if any issues or problems arise.   Landis Martins, DPM

## 2019-07-17 ENCOUNTER — Telehealth: Payer: Self-pay | Admitting: *Deleted

## 2019-07-17 DIAGNOSIS — Z9889 Other specified postprocedural states: Secondary | ICD-10-CM

## 2019-07-17 DIAGNOSIS — L97511 Non-pressure chronic ulcer of other part of right foot limited to breakdown of skin: Secondary | ICD-10-CM

## 2019-07-17 NOTE — Telephone Encounter (Signed)
-----   Message from Landis Martins, [place] sent at 07/16/2019  5:50 PM EDT ----- Regarding: Home nurse Please contact [facility] home nursing and have them come out tomorrow 9/16 to change patient's dressing apply betadine and dry dressing to the right foot. Patient to see Dr. March Rummage on Thursday for re-check of the wound. He will determine if he wants resume or restart the vac.  The Prevena vac which the patient has is only good for 7 days and then the vac automatically shuts off -Dr. Cannon Kettle

## 2019-07-17 NOTE — Telephone Encounter (Signed)
I informed Katherine Herrera and faxed orders to [facility].

## 2019-07-17 NOTE — Telephone Encounter (Signed)
I called pt and she states she did not refuse [facility] services.

## 2019-07-17 NOTE — Telephone Encounter (Signed)
Faxed copy of Dr. Leeanne Rio 07/16/2019 5:50pm orders to [facility].

## 2019-07-17 NOTE — Telephone Encounter (Signed)
Dr. March Rummage ordered iodosorb to incision site 3 times a day. Left message informing pt Katherine Herrera would be contacted with new orders.

## 2019-07-17 NOTE — Telephone Encounter (Deleted)
Dr. March Rummage ordered iodosorb to the surgery sites 3 times a week. Left message informing pt Dr. March Rummage had changed the wound care orders and it would contact [facility] to begin wound care tomorrow.

## 2019-07-17 NOTE — Telephone Encounter (Signed)
If there is no strike through she can leave in place until tomorrow however if there is strike through or drainage patient can change it herself or get a family/friend to help to change the dressing until she can be seen tomorrow by Dr. March Rummage

## 2019-07-17 NOTE — Telephone Encounter (Signed)
Katherine Herrera - Katherine Herrera states pt was opened with their agency yesterday, but pt refuses nursing services and betadine dry dressing is not a skilled nursing task and will not be a covered service.

## 2019-07-19 ENCOUNTER — Ambulatory Visit (INDEPENDENT_AMBULATORY_CARE_PROVIDER_SITE_OTHER): Payer: Self-pay | Admitting: Podiatry

## 2019-07-19 ENCOUNTER — Ambulatory Visit (INDEPENDENT_AMBULATORY_CARE_PROVIDER_SITE_OTHER): Payer: Medicare Other

## 2019-07-19 ENCOUNTER — Inpatient Hospital Stay (HOSPITAL_COMMUNITY)
Admission: AD | Admit: 2019-07-19 | Discharge: 2019-07-25 | DRG: 464 | Disposition: A | Discharge status: Home / Self Care | Payer: Medicare Other | Source: Ambulatory Visit | Attending: Internal Medicine | Admitting: Internal Medicine

## 2019-07-19 ENCOUNTER — Other Ambulatory Visit: Payer: Self-pay

## 2019-07-19 ENCOUNTER — Encounter (HOSPITAL_COMMUNITY): Payer: Self-pay

## 2019-07-19 VITALS — Temp 99.6°F

## 2019-07-19 DIAGNOSIS — E039 Hypothyroidism, unspecified: Secondary | ICD-10-CM | POA: Diagnosis present

## 2019-07-19 DIAGNOSIS — Z20828 Contact with and (suspected) exposure to other viral communicable diseases: Secondary | ICD-10-CM | POA: Diagnosis present

## 2019-07-19 DIAGNOSIS — Z90710 Acquired absence of both cervix and uterus: Secondary | ICD-10-CM

## 2019-07-19 DIAGNOSIS — L02611 Cutaneous abscess of right foot: Secondary | ICD-10-CM | POA: Diagnosis present

## 2019-07-19 DIAGNOSIS — E669 Obesity, unspecified: Secondary | ICD-10-CM | POA: Diagnosis present

## 2019-07-19 DIAGNOSIS — Z981 Arthrodesis status: Secondary | ICD-10-CM | POA: Diagnosis not present

## 2019-07-19 DIAGNOSIS — T8781 Dehiscence of amputation stump: Secondary | ICD-10-CM | POA: Diagnosis not present

## 2019-07-19 DIAGNOSIS — Z6833 Body mass index (BMI) 33.0-33.9, adult: Secondary | ICD-10-CM | POA: Diagnosis not present

## 2019-07-19 DIAGNOSIS — F17210 Nicotine dependence, cigarettes, uncomplicated: Secondary | ICD-10-CM | POA: Diagnosis present

## 2019-07-19 DIAGNOSIS — Z89432 Acquired absence of left foot: Secondary | ICD-10-CM

## 2019-07-19 DIAGNOSIS — N179 Acute kidney failure, unspecified: Secondary | ICD-10-CM | POA: Diagnosis present

## 2019-07-19 DIAGNOSIS — L03115 Cellulitis of right lower limb: Secondary | ICD-10-CM | POA: Diagnosis present

## 2019-07-19 DIAGNOSIS — G8929 Other chronic pain: Secondary | ICD-10-CM | POA: Diagnosis not present

## 2019-07-19 DIAGNOSIS — Z794 Long term (current) use of insulin: Secondary | ICD-10-CM | POA: Diagnosis not present

## 2019-07-19 DIAGNOSIS — E0865 Diabetes mellitus due to underlying condition with hyperglycemia: Secondary | ICD-10-CM | POA: Diagnosis not present

## 2019-07-19 DIAGNOSIS — L97414 Non-pressure chronic ulcer of right heel and midfoot with necrosis of bone: Secondary | ICD-10-CM | POA: Diagnosis not present

## 2019-07-19 DIAGNOSIS — E11621 Type 2 diabetes mellitus with foot ulcer: Secondary | ICD-10-CM | POA: Diagnosis not present

## 2019-07-19 DIAGNOSIS — E11649 Type 2 diabetes mellitus with hypoglycemia without coma: Secondary | ICD-10-CM | POA: Diagnosis not present

## 2019-07-19 DIAGNOSIS — L97519 Non-pressure chronic ulcer of other part of right foot with unspecified severity: Secondary | ICD-10-CM | POA: Diagnosis present

## 2019-07-19 DIAGNOSIS — L97511 Non-pressure chronic ulcer of other part of right foot limited to breakdown of skin: Secondary | ICD-10-CM | POA: Diagnosis not present

## 2019-07-19 DIAGNOSIS — F419 Anxiety disorder, unspecified: Secondary | ICD-10-CM | POA: Diagnosis present

## 2019-07-19 DIAGNOSIS — Z79890 Hormone replacement therapy (postmenopausal): Secondary | ICD-10-CM

## 2019-07-19 DIAGNOSIS — Z882 Allergy status to sulfonamides status: Secondary | ICD-10-CM

## 2019-07-19 DIAGNOSIS — M79609 Pain in unspecified limb: Secondary | ICD-10-CM | POA: Diagnosis present

## 2019-07-19 DIAGNOSIS — L03031 Cellulitis of right toe: Secondary | ICD-10-CM | POA: Diagnosis present

## 2019-07-19 DIAGNOSIS — L0390 Cellulitis, unspecified: Secondary | ICD-10-CM | POA: Diagnosis not present

## 2019-07-19 DIAGNOSIS — J441 Chronic obstructive pulmonary disease with (acute) exacerbation: Secondary | ICD-10-CM | POA: Diagnosis not present

## 2019-07-19 DIAGNOSIS — I10 Essential (primary) hypertension: Secondary | ICD-10-CM | POA: Diagnosis present

## 2019-07-19 DIAGNOSIS — E785 Hyperlipidemia, unspecified: Secondary | ICD-10-CM | POA: Diagnosis present

## 2019-07-19 DIAGNOSIS — E875 Hyperkalemia: Secondary | ICD-10-CM | POA: Diagnosis not present

## 2019-07-19 DIAGNOSIS — E1142 Type 2 diabetes mellitus with diabetic polyneuropathy: Secondary | ICD-10-CM | POA: Diagnosis not present

## 2019-07-19 DIAGNOSIS — L97518 Non-pressure chronic ulcer of other part of right foot with other specified severity: Secondary | ICD-10-CM | POA: Diagnosis not present

## 2019-07-19 DIAGNOSIS — J449 Chronic obstructive pulmonary disease, unspecified: Secondary | ICD-10-CM | POA: Diagnosis present

## 2019-07-19 DIAGNOSIS — Z888 Allergy status to other drugs, medicaments and biological substances status: Secondary | ICD-10-CM

## 2019-07-19 DIAGNOSIS — T8130XA Disruption of wound, unspecified, initial encounter: Secondary | ICD-10-CM | POA: Diagnosis not present

## 2019-07-19 DIAGNOSIS — Z8249 Family history of ischemic heart disease and other diseases of the circulatory system: Secondary | ICD-10-CM

## 2019-07-19 DIAGNOSIS — M545 Low back pain: Secondary | ICD-10-CM | POA: Diagnosis not present

## 2019-07-19 DIAGNOSIS — Z89431 Acquired absence of right foot: Secondary | ICD-10-CM

## 2019-07-19 DIAGNOSIS — Z8279 Family history of other congenital malformations, deformations and chromosomal abnormalities: Secondary | ICD-10-CM

## 2019-07-19 DIAGNOSIS — Z4781 Encounter for orthopedic aftercare following surgical amputation: Secondary | ICD-10-CM | POA: Diagnosis not present

## 2019-07-19 DIAGNOSIS — E1140 Type 2 diabetes mellitus with diabetic neuropathy, unspecified: Secondary | ICD-10-CM | POA: Diagnosis not present

## 2019-07-19 DIAGNOSIS — Z79899 Other long term (current) drug therapy: Secondary | ICD-10-CM

## 2019-07-19 DIAGNOSIS — L97413 Non-pressure chronic ulcer of right heel and midfoot with necrosis of muscle: Secondary | ICD-10-CM | POA: Diagnosis not present

## 2019-07-19 DIAGNOSIS — E11628 Type 2 diabetes mellitus with other skin complications: Secondary | ICD-10-CM | POA: Diagnosis present

## 2019-07-19 DIAGNOSIS — E1165 Type 2 diabetes mellitus with hyperglycemia: Secondary | ICD-10-CM | POA: Diagnosis not present

## 2019-07-19 DIAGNOSIS — Z885 Allergy status to narcotic agent status: Secondary | ICD-10-CM

## 2019-07-19 DIAGNOSIS — Z833 Family history of diabetes mellitus: Secondary | ICD-10-CM

## 2019-07-19 DIAGNOSIS — Z7982 Long term (current) use of aspirin: Secondary | ICD-10-CM

## 2019-07-19 DIAGNOSIS — G894 Chronic pain syndrome: Secondary | ICD-10-CM

## 2019-07-19 DIAGNOSIS — L97919 Non-pressure chronic ulcer of unspecified part of right lower leg with unspecified severity: Secondary | ICD-10-CM | POA: Diagnosis not present

## 2019-07-19 DIAGNOSIS — F17200 Nicotine dependence, unspecified, uncomplicated: Secondary | ICD-10-CM | POA: Diagnosis not present

## 2019-07-19 LAB — CBC WITH DIFFERENTIAL/PLATELET
Abs Immature Granulocytes: 0.04 10*3/uL (ref 0.00–0.07)
Basophils Absolute: 0.1 10*3/uL (ref 0.0–0.1)
Basophils Relative: 1 %
Eosinophils Absolute: 0.4 10*3/uL (ref 0.0–0.5)
Eosinophils Relative: 3 %
HCT: 43.1 % (ref 36.0–46.0)
Hemoglobin: 13.6 g/dL (ref 12.0–15.0)
Immature Granulocytes: 0 %
Lymphocytes Relative: 34 %
Lymphs Abs: 3.9 10*3/uL (ref 0.7–4.0)
MCH: 27.9 pg (ref 26.0–34.0)
MCHC: 31.6 g/dL (ref 30.0–36.0)
MCV: 88.3 fL (ref 80.0–100.0)
Monocytes Absolute: 0.7 10*3/uL (ref 0.1–1.0)
Monocytes Relative: 6 %
Neutro Abs: 6.3 10*3/uL (ref 1.7–7.7)
Neutrophils Relative %: 56 %
Platelets: 492 10*3/uL — ABNORMAL HIGH (ref 150–400)
RBC: 4.88 MIL/uL (ref 3.87–5.11)
RDW: 13.4 % (ref 11.5–15.5)
WBC: 11.4 10*3/uL — ABNORMAL HIGH (ref 4.0–10.5)
nRBC: 0 % (ref 0.0–0.2)

## 2019-07-19 LAB — GLUCOSE, CAPILLARY: Glucose-Capillary: 235 mg/dL — ABNORMAL HIGH (ref 70–99)

## 2019-07-19 MED ORDER — ONDANSETRON HCL 4 MG PO TABS: 4.0000 mg | ORAL_TABLET | Freq: Four times a day (QID) | ORAL | Status: DC | PRN

## 2019-07-19 MED ORDER — ACETAMINOPHEN 650 MG RE SUPP: 650.0000 mg | Freq: Four times a day (QID) | RECTAL | Status: DC | PRN

## 2019-07-19 MED ORDER — SENNA 8.6 MG PO TABS
1.0000 | ORAL_TABLET | Freq: Two times a day (BID) | ORAL | Status: DC
  Administered 2019-07-19 – 2019-07-25 (×9): 8.6 mg via ORAL
  Filled 2019-07-19 (×11): qty 1

## 2019-07-19 MED ORDER — SODIUM CHLORIDE 0.9 % IV SOLN
250.0000 mL | INTRAVENOUS | Status: DC | PRN
  Administered 2019-07-20: 250 mL via INTRAVENOUS

## 2019-07-19 MED ORDER — HYDROMORPHONE HCL 2 MG PO TABS
6.0000 mg | ORAL_TABLET | Freq: Every day | ORAL | Status: DC | PRN
  Administered 2019-07-19 – 2019-07-25 (×18): 6 mg via ORAL
  Filled 2019-07-19 (×20): qty 3

## 2019-07-19 MED ORDER — CLONAZEPAM 0.5 MG PO TABS
0.5000 mg | ORAL_TABLET | Freq: Two times a day (BID) | ORAL | Status: DC | PRN
  Filled 2019-07-19: qty 1

## 2019-07-19 MED ORDER — POLYETHYLENE GLYCOL 3350 17 G PO PACK: 17.0000 g | PACK | Freq: Every day | ORAL | Status: DC | PRN

## 2019-07-19 MED ORDER — VANCOMYCIN HCL 10 G IV SOLR
2000.0000 mg | Freq: Once | INTRAVENOUS | Status: AC
  Administered 2019-07-20: 2000 mg via INTRAVENOUS
  Filled 2019-07-19: qty 2000

## 2019-07-19 MED ORDER — INSULIN ASPART 100 UNIT/ML ~~LOC~~ SOLN
0.0000 [IU] | SUBCUTANEOUS | Status: DC
  Administered 2019-07-19: 3 [IU] via SUBCUTANEOUS
  Administered 2019-07-20: 2 [IU] via SUBCUTANEOUS
  Administered 2019-07-20: 9 [IU] via SUBCUTANEOUS
  Administered 2019-07-20 – 2019-07-21 (×2): 5 [IU] via SUBCUTANEOUS
  Administered 2019-07-21 (×2): 9 [IU] via SUBCUTANEOUS

## 2019-07-19 MED ORDER — ONDANSETRON HCL 4 MG/2ML IJ SOLN
4.0000 mg | Freq: Four times a day (QID) | INTRAMUSCULAR | Status: DC | PRN
  Filled 2019-07-19: qty 2

## 2019-07-19 MED ORDER — ROPINIROLE HCL 0.5 MG PO TABS
0.5000 mg | ORAL_TABLET | Freq: Every day | ORAL | Status: DC
  Administered 2019-07-19 – 2019-07-24 (×6): 0.5 mg via ORAL
  Filled 2019-07-19 (×8): qty 1

## 2019-07-19 MED ORDER — LEVOTHYROXINE SODIUM 75 MCG PO TABS
75.0000 ug | ORAL_TABLET | Freq: Every day | ORAL | Status: DC
  Administered 2019-07-20 – 2019-07-25 (×5): 75 ug via ORAL
  Filled 2019-07-19 (×5): qty 1

## 2019-07-19 MED ORDER — EZETIMIBE 10 MG PO TABS
10.0000 mg | ORAL_TABLET | Freq: Every day | ORAL | Status: DC
  Administered 2019-07-19 – 2019-07-25 (×6): 10 mg via ORAL
  Filled 2019-07-19 (×7): qty 1

## 2019-07-19 MED ORDER — PIPERACILLIN-TAZOBACTAM 3.375 G IVPB 30 MIN
3.3750 g | INTRAVENOUS | Status: AC
  Administered 2019-07-20: 3.375 g via INTRAVENOUS
  Filled 2019-07-19 (×2): qty 50

## 2019-07-19 MED ORDER — LORATADINE 10 MG PO TABS: 10.0000 mg | ORAL_TABLET | Freq: Every day | ORAL | Status: DC | PRN

## 2019-07-19 MED ORDER — TRAMADOL HCL 50 MG PO TABS
50.0000 mg | ORAL_TABLET | Freq: Four times a day (QID) | ORAL | Status: DC | PRN
  Administered 2019-07-24: 50 mg via ORAL
  Filled 2019-07-19 (×2): qty 1

## 2019-07-19 MED ORDER — SODIUM CHLORIDE 0.9% FLUSH: 3.0000 mL | INTRAVENOUS | Status: DC | PRN

## 2019-07-19 MED ORDER — BACLOFEN 20 MG PO TABS
20.0000 mg | ORAL_TABLET | Freq: Two times a day (BID) | ORAL | Status: DC
  Administered 2019-07-19 – 2019-07-25 (×12): 20 mg via ORAL
  Filled 2019-07-19 (×12): qty 1

## 2019-07-19 MED ORDER — GABAPENTIN 400 MG PO CAPS
800.0000 mg | ORAL_CAPSULE | Freq: Four times a day (QID) | ORAL | Status: DC
  Administered 2019-07-19 – 2019-07-25 (×18): 800 mg via ORAL
  Filled 2019-07-19 (×18): qty 2

## 2019-07-19 MED ORDER — LAMOTRIGINE 25 MG PO TABS
25.0000 mg | ORAL_TABLET | Freq: Two times a day (BID) | ORAL | Status: DC
  Administered 2019-07-19 – 2019-07-25 (×11): 25 mg via ORAL
  Filled 2019-07-19 (×12): qty 1

## 2019-07-19 MED ORDER — ACETAMINOPHEN 325 MG PO TABS
650.0000 mg | ORAL_TABLET | Freq: Four times a day (QID) | ORAL | Status: DC | PRN
  Administered 2019-07-24 – 2019-07-25 (×2): 650 mg via ORAL
  Filled 2019-07-19 (×2): qty 2

## 2019-07-19 NOTE — Progress Notes (Signed)
Pharmacy Antibiotic Note  Katherine Herrera is a 53 y.o. female admitted on 07/19/2019 with wound infection.  Pharmacy has been consulted for zosyn and vancomycin dosing.  Plan: Vancomycin 2 Gm x1 then 1500 mg IV q24h est AUC = 470 Goal AUC = 400-550 ZEI Daily SCr F/u cultures/levels    Temp (24hrs), Avg:99.3 F (37.4 C), Min:98.9 F (37.2 C), Max:99.6 F (37.6 C)  No results for input(s): WBC, CREATININE, LATICACIDVEN, VANCOTROUGH, VANCOPEAK, VANCORANDOM, GENTTROUGH, GENTPEAK, GENTRANDOM, TOBRATROUGH, TOBRAPEAK, TOBRARND, AMIKACINPEAK, AMIKACINTROU, AMIKACIN in the last 168 hours.  CrCl cannot be calculated (Unknown ideal weight.).    Allergies  Allergen Reactions  . Hydromorphone Hcl Itching    Patient has been tolerating Hydromorphone tablets without adverse effect (07/09/19)  . Cephalexin Nausea And Vomiting  . Demerol  [Meperidine Hcl] Rash  . Lovastatin Other (See Comments)    Other reaction(s): Unknown (See Comments) Possible myalgia Unknown   . Metformin And Related Nausea And Vomiting  . Sulfamethoxazole-Trimethoprim     Other reaction(s): Unknown DILI, pancreatitis  . Crestor [Rosuvastatin Calcium]     myalgia  . Niacin Other (See Comments)    Unknown   . Paroxetine Other (See Comments)    Other reaction(s): Unknown (See Comments) unknown Unknown     Antimicrobials this admission: 9/18 zosyn >>  9/18 vancomycin >>   Dose adjustments this admission:   Microbiology results:  BCx:   UCx:    Sputum:    MRSA PCR:   Thank you for allowing pharmacy to be a part of this patient's care.  Dorrene German 07/19/2019 10:03 PM

## 2019-07-19 NOTE — H&P (Addendum)
[name] FXJ:[id number] DOB: Apr 19, 1966 DOA: 07/19/2019     PCP: Cassandria Anger, MD   Outpatient Specialists:      Podiatry Dr. March Rummage  Patient arrived to ER on  at   Patient coming from: home Lives  With family   Chief Complaint: Worsening wound  HPI: Katherine Herrera is a 53 y.o. female with medical history significant of DM2, diabetic foot with Charcot deformity seen, sp fifth metatarsal resection, chronic pain, COPD, hypothyroidism, HLD    Presented with worsening wound after right foot metatarsal resection On 4th of September patient undergone right foot fifth metatarsal resection with excision of ulcer done by Dr. March Rummage She was noted on Saturday about 6 days ago that the wound vacs that she was started on stopped working.  She had home nurse come out to see her. She was seen in the office today seems like the wound was not improving despite being on oral Augmentin Dr. March Rummage her podiatrist recommended patient to be admitted for IV antibiotics vancomycin and Zosyn recommended for her to be n.p.o. after midnight for possible intervention Patient otherwise has not had any nausea vomiting or fevers  Patient arrived from home as a direct admit  She reports her BG has been running low lately, She has been using a walker She is still on Augmentin sh has one more day left  Infectious risk factors:  Reports none In  ER RAPID COVID TEST  in house testing  Pending  Lab Results  Component Value Date   [place] NEGATIVE 07/05/2019    Regarding pertinent Chronic problems:     Hyperlipidemia -  on statins Zetia   HTN on lisinopril   DM 2 -  Lab Results  Component Value Date   HGBA1C 8.1 (H) 07/04/2019   on insulin NPH 70/30,     Hypothyroidism:  Lab Results  Component Value Date   TSH 0.51 12/19/2018   on synthroid   obesity-   BMI Readings from Last 1 Encounters:  07/05/19 33.79 kg/m         COPD - not  followed by pulmonology not on baseline  oxygen, occasionally still smokes  not on any medication     Significant initial  Findings: Abnormal Labs Reviewed - No data to display   Otherwise labs showing:    Recent Labs  Lab 07/19/19 2320  NA 139  K 4.2  CO2 29  GLUCOSE 260*  BUN 19  CREATININE 1.16*  CALCIUM 9.1    Cr   Up from baseline see below Lab Results  Component Value Date   CREATININE 1.16 (H) 07/19/2019   CREATININE 0.97 07/10/2019   CREATININE 0.97 07/09/2019    No results for input(s): AST, ALT, ALKPHOS, BILITOT, PROT, ALBUMIN in the last 168 hours. Lab Results  Component Value Date   CALCIUM 9.1 07/19/2019   PHOS 3.1 09/30/2018       WBC      Component Value Date/Time   WBC 11.4 (H) 07/19/2019 2320   ANC    Component Value Date/Time   NEUTROABS 6.3 07/19/2019 2320      Plt: Lab Results  Component Value Date   PLT 492 (H) 07/19/2019        COVID-19 Labs  No results for input(s): DDIMER, FERRITIN, LDH, CRP in the last 72 hours.  Lab Results  Component Value Date   SARSCOV2NAA NEGATIVE 07/05/2019     HG/HCT  Stable     Component Value  Date/Time   HGB 13.6 07/19/2019 2320   HCT 43.1 07/19/2019 2320      ECG: Ordered    BNP (last 3 results) Recent Labs    09/28/18 2112  BNP 58.0    ProBNP (last 3 results) No results for input(s): PROBNP in the last 8760 hours.  DM  labs:  HbA1C: Recent Labs    09/29/18 0656 07/04/19 2338  HGBA1C 8.5* 8.1*      ED Triage Vitals  Enc Vitals Group     BP 07/19/19 2053 130/82     Pulse Rate 07/19/19 2053 88     Resp 07/19/19 2053 16     Temp 07/19/19 2053 98.9 F (37.2 C)     Temp Source 07/19/19 2053 Oral     SpO2 07/19/19 2053 97 %     Weight --      Height --      Head Circumference --      Peak Flow --      Pain Score 07/19/19 2048 9     Pain Loc --      Pain Edu? --      Excl. in [place]? --   TMAX(24)@       Latest  Blood pressure 130/82, pulse 88, temperature 98.9 F (37.2 C), temperature source Oral, resp.  rate 16, SpO2 97 %.    Hospitalist was called for admission for diabetic foot ulcer   Review of Systems:    Pertinent positives include:  Fatigue, foot pain  Constitutional:  No weight loss, night sweats, Fevers, chills, weight loss  HEENT:  No headaches, Difficulty swallowing,Tooth/dental problems,Sore throat,  No sneezing, itching, ear ache, nasal congestion, post nasal drip,  Cardio-vascular:  No chest pain, Orthopnea, PND, anasarca, dizziness, palpitations.no Bilateral lower extremity swelling  GI:  No heartburn, indigestion, abdominal pain, nausea, vomiting, diarrhea, change in bowel habits, loss of appetite, melena, blood in stool, hematemesis Resp:  no shortness of breath at rest. No dyspnea on exertion, No excess mucus, no productive cough, No non-productive cough, No coughing up of blood.No change in color of mucus.No wheezing. Skin:  no rash or lesions. No jaundice GU:  no dysuria, change in color of urine, no urgency or frequency. No straining to urinate.  No flank pain.  Musculoskeletal:  No joint pain or no joint swelling. No decreased range of motion. No back pain.  Psych:  No change in mood or affect. No depression or anxiety. No memory loss.  Neuro: no localizing neurological complaints, no tingling, no weakness, no double vision, no gait abnormality, no slurred speech, no confusion  All systems reviewed and apart from [place] all are negative  Past Medical History:   Past Medical History:  Diagnosis Date  . Anxiety   . COPD (chronic obstructive pulmonary disease) ([place])   . DM (diabetes mellitus) ([place])    type 2  . Family history of adverse reaction to anesthesia    mother hard to wake up  . Gout    possible  . Hyperlipidemia   . Hypothyroidism   . LBP (low back pain)    DR Patrice Paradise  . Migraine   . Peripheral neuropathy   . Syncope    poss conversion disorder vs psychogenic seizures  . Tachycardia    diet coke overuse?  Marland Kitchen Tenosynovitis of foot and  ankle 09/03/2013  . TTS (tarsal tunnel syndrome) 2010   Left Foot [place] in [place]  . Vitamin D deficiency   . Wound, open  both feet outer parts no drainage, changes dressing 1 day, wounds dime size for last month     Past Surgical History:  Procedure Laterality Date  . ABDOMINAL HYSTERECTOMY    . APPLICATION OF WOUND VAC Right 07/05/2019   Procedure: Application Of Wound Vac;  Surgeon: Evelina Bucy, DPM;  Location: [facility];  Service: Podiatry;  Laterality: Right;  . CARPAL TUNNEL RELEASE  2010   LEFT  . CERVICAL FUSION     C4-5  . METATARSAL HEAD EXCISION Bilateral 01/02/2019   Procedure: Left foot 5th metatarsal resection, wound excision, adjacent tissue transfer. Right fifth metatarsal head resection, fitfth metatarsal base resection, intermediate wound repair.;  Surgeon: Evelina Bucy, DPM;  Location: [facility];  Service: Podiatry;  Laterality: Bilateral;  . METATARSAL OSTEOTOMY Right 07/05/2019   Procedure: RIGHT FOOT 5TH METATARSAL RESECTION; PERONEAL TENDONESIS, EXCISION OF ULCER, LOCAL TISSUE TRANSFER ;  Surgeon: Evelina Bucy, DPM;  Location: [facility];  Service: Podiatry;  Laterality: Right;    Social History:  Ambulatory  Gilford Rile      reports that she has been smoking. She has been smoking about 1.00 pack per day. She has never used smokeless tobacco. She reports that she does not drink alcohol or use drugs.     Family History:   Family History  Problem Relation Age of Onset  . Diabetes Mother   . Hypertension Mother   . Cancer Father 71       ? bone cancer  . [name] Brother     Allergies: Allergies  Allergen Reactions  . Hydromorphone Hcl Itching    Patient has been tolerating Hydromorphone tablets without adverse effect (07/09/19)  . Cephalexin Nausea And Vomiting  . Demerol  [Meperidine Hcl] Rash  . Lovastatin Other (See Comments)    Other reaction(s): Unknown (See Comments) Possible myalgia Unknown   . Metformin And Related Nausea  And Vomiting  . Sulfamethoxazole-Trimethoprim     Other reaction(s): Unknown DILI, pancreatitis  . Crestor [Rosuvastatin Calcium]     myalgia  . Niacin Other (See Comments)    Unknown   . Paroxetine Other (See Comments)    Other reaction(s): Unknown (See Comments) unknown Unknown      Prior to Admission medications   Medication Sig Start Date End Date Taking? Authorizing Provider  amoxicillin-clavulanate (AUGMENTIN) 875-125 MG tablet Take 1 tablet by mouth 2 (two) times daily for 10 days. 07/10/19 07/20/19  Aline August, MD  aspirin (BAYER ASPIRIN) 325 MG tablet Take 1 tablet (325 mg total) by mouth daily. 06/16/15   Plotnikov, [name], MD  B-D INS SYR ULTRAFINE 1CC/31G 31G X 5/16" 1 ML MISC USE AS DIRECTED 09/08/16   Renato Shin, MD  baclofen (LIORESAL) 10 MG tablet Take 2 tablets (20 mg total) by mouth 2 (two) times daily. 03/28/19   Plotnikov, [name], MD  Blood Glucose Monitoring Suppl (ONETOUCH VERIO) w/Device KIT  11/28/18   [provider]  cholecalciferol (VITAMIN D) 25 MCG (1000 UT) tablet Take 2 tablets (2,000 Units total) by mouth daily. 12/22/18   Plotnikov, [name], MD  clonazePAM (KLONOPIN) 0.5 MG tablet Take 1 tablet (0.5 mg total) by mouth 2 (two) times daily as needed for anxiety. 07/01/19   Plotnikov, [name], MD  doxycycline (VIBRA-TABS) 100 MG tablet Take 1 tablet (100 mg total) by mouth 2 (two) times daily for 10 days. 07/10/19 07/20/19  Aline August, MD  ezetimibe (ZETIA) 10 MG tablet Take 1 tablet (10 mg  total) by mouth daily. 03/28/19   Plotnikov, [name], MD  gabapentin (NEURONTIN) 800 MG tablet Take 1 tablet (800 mg total) by mouth 4 (four) times daily. 03/28/19   Plotnikov, [name], MD  glucose blood (ONETOUCH VERIO) test strip Use to check blood sugar 2 times per day. 06/24/16   Renato Shin, MD  HYDROmorphone (DILAUDID) 4 MG tablet Take 1.5 tablets (6 mg total) by mouth 5 (five) times daily as needed for severe pain. Every 4 hours as needed for  severe pain, not to exceed 5 times daily 07/12/19   Price, Christian Mate, DPM  insulin NPH-regular Human (70-30) 100 UNIT/ML injection Inject 70 Units into the skin 2 (two) times daily with a meal.    [provider]  Insulin Syringe-Needle U-100 (SURE COMFORT INSULIN SYRINGE) 31G X 5/16" 0.5 ML MISC USE AS DIRECTED WITH INSULIN 4 TIMES A DAY 09/08/17   Renato Shin, MD  lamoTRIgine (LAMICTAL) 25 MG tablet TAKE 1 TABLET BY MOUTH TWO  TIMES DAILY Patient taking differently: Take 25 mg by mouth 2 (two) times daily.  10/03/18   Plotnikov, [name], MD  Lancet Devices (ACCU-CHEK [facility]) lancets Test two times daily 07/01/16   Renato Shin, MD  levothyroxine (SYNTHROID, LEVOTHROID) 75 MCG tablet TAKE 1 TABLET BY MOUTH  DAILY 10/03/18   Plotnikov, [name], MD  lisinopril (ZESTRIL) 10 MG tablet Take 1 tablet (10 mg total) by mouth daily. 03/28/19   Plotnikov, [name], MD  loratadine (CLARITIN) 10 MG tablet Take 10 mg by mouth daily as needed for allergies.    [provider]  polyethylene glycol (MIRALAX / GLYCOLAX) 17 g packet Take 17 g by mouth daily as needed for moderate constipation. 07/10/19   Aline August, MD  rOPINIRole (REQUIP) 0.5 MG tablet Take 1 tablet (0.5 mg total) by mouth at bedtime. 03/28/19   Plotnikov, [name], MD  senna-docusate (SENOKOT-S) 8.6-50 MG tablet Take 1 tablet by mouth 2 (two) times daily. 07/10/19   Aline August, MD  vitamin B-12 (CYANOCOBALAMIN) 1000 MCG tablet Take 1,000 mcg by mouth daily.      [provider]   Physical Exam: Blood pressure 130/82, pulse 88, temperature 98.9 F (37.2 C), temperature source Oral, resp. rate 16, SpO2 97 %. 1. General:  in No Acute distress   Chronically ill  -appearing 2. Psychological: Alert and  Oriented 3. Head/ENT:    Dry Mucous Membranes                          Head Non traumatic, neck supple                        Poor Dentition 4. SKIN:   decreased Skin turgor,  Skin clean Dry and intact no rash 5.  Heart: Regular rate and rhythm no  Murmur, no Rub or gallop 6. Lungs:  no wheezes or crackles   7. Abdomen: Soft, non-tender, Non distended  bowel sounds present 8. Lower extremities: no clubbing, cyanosis, no edema, Right foot warm with extensive dressing in place  9. Neurologically Grossly intact, moving all 4 extremities equally   10. MSK: Normal range of motion   All other LABS:     Recent Labs  Lab 07/19/19 2320  WBC 11.4*  NEUTROABS 6.3  HGB 13.6  HCT 43.1  MCV 88.3  PLT 492*     Recent Labs  Lab 07/19/19 2320  NA 139  K 4.2  CL 98  CO2 29  GLUCOSE 260*  BUN 19  CREATININE 1.16*  CALCIUM 9.1     Recent Labs  Lab 07/19/19 2320  AST 13*  ALT 11  ALKPHOS 112  BILITOT 0.2*  PROT 7.9  ALBUMIN 3.4*       Cultures:    Component Value Date/Time   SDES BLOOD RIGHT ARM 07/05/2019 1636   SDES BLOOD RIGHT ARM 07/05/2019 1636   SPECREQUEST  07/05/2019 1636    BOTTLES DRAWN AEROBIC ONLY Blood Culture adequate volume   SPECREQUEST  07/05/2019 1636    BOTTLES DRAWN AEROBIC ONLY Blood Culture adequate volume   CULT  07/05/2019 1636    NO GROWTH 5 DAYS Performed at [facility], [place] [address] [place], [place] [address]    CULT  07/05/2019 1636    NO GROWTH 5 DAYS Performed at [facility], [place] [address], [place], [place] [address]    REPTSTATUS 07/10/2019 FINAL 07/05/2019 1636   REPTSTATUS 07/10/2019 FINAL 07/05/2019 1636     Radiological Exams on Admission: No results found.  Chart has been reviewed    Assessment/Plan  53 y.o. female with medical history significant of Dm2, diabetic foot with Charcot deformity seen, sp fifth metatarsal resection, chronic pain, COPD, hypothyroidism, HLD     Admitted for diabetic foot infection  Present on Admission: .Right  Diabetic foot ulcer ([place]) - -admit perdiabetic foot ulcer protocol will      Start IV zosyn and Vanc     Will obtain MRSA screening,       obtain blood cultures   Make NPO  post midnight Wound consult  Discussed with Dr. Moise Boring from Dr. March Rummage office Plan to keep n.p.o. post midnight continue antibiotics they will reassess in the morning     further antibiotic adjustment pending above results  . Polyneuropathy due to type 2 diabetes mellitus ([place]) - continue Neurontin  . Hypothyroidism - - Check TSH continue home medications at current dose  . Dyslipidemia - continue home meds  . TOBACCO USE  - tobacco cessation protocol  . FOOT PAIN -continue home medications and add IV as needed for inpatient n.p.o.  . 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) -  - Order Sensitive  SSI   -Patient reports BG have been running lower, BG 235 today , will switch to Lantus while hospitalized at 20 units QHS and continue to monitor may need to adjust pending further results  with sensitive sliding scale every 4 hours  -  check TSH and HgA1C     . Chronic pain -  Chronic continue home medications    Other plan as per orders.  DVT prophylaxis:  SCD     Code Status:  FULL CODE  as per patient   I had personally discussed CODE STATUS with patient   Family Communication:   Family not at  Bedside    Disposition Plan:     To home once workup is complete and patient is stable                     Would benefit from PT/OT eval prior to DC  Ordered                                    Wound care  consulted  Consults called: none please alert podiatry in AM that patient has been admitted    Admission status:  ED Disposition    None           inpatient     Expect 2 midnight stay secondary to severity of patient's current illness including  evidence of worsening of foot ulcer and extensive comorbidities including:  Chronic pain  DM2      COPD/asthma   . History of amputation   That are currently affecting medical management.   I expect  patient to be hospitalized for 2 midnights requiring inpatient medical  care.  Patient is at high risk for adverse outcome (such as loss of life or disability) if not treated.  Indication for inpatient stay as follows:    severe pain requiring acute inpatient management,  inability to maintain oral hydration overnight   Need for operative/procedural  intervention    Need for IV antibiotics, IV fluids,        Level of care    medical floor      Precautions:   No active isolations  PPE: Used by the provider:   P100  eye Goggles,  Gloves    Katherine Herrera 07/20/2019, 12:09 AM    [facility]     after 2 AM please page floor coverage PA If 7AM-7PM, please contact the day team taking care of the patient using [email or web address]

## 2019-07-20 ENCOUNTER — Encounter (HOSPITAL_COMMUNITY)
Admission: AD | Disposition: A | Discharge status: Home / Self Care | Payer: Self-pay | Source: Ambulatory Visit | Attending: Internal Medicine

## 2019-07-20 ENCOUNTER — Inpatient Hospital Stay (HOSPITAL_COMMUNITY): Payer: Medicare Other

## 2019-07-20 ENCOUNTER — Inpatient Hospital Stay (HOSPITAL_COMMUNITY): Payer: Medicare Other | Admitting: Certified Registered"

## 2019-07-20 DIAGNOSIS — L02611 Cutaneous abscess of right foot: Secondary | ICD-10-CM

## 2019-07-20 DIAGNOSIS — E11621 Type 2 diabetes mellitus with foot ulcer: Secondary | ICD-10-CM

## 2019-07-20 DIAGNOSIS — J441 Chronic obstructive pulmonary disease with (acute) exacerbation: Secondary | ICD-10-CM | POA: Diagnosis not present

## 2019-07-20 DIAGNOSIS — L97919 Non-pressure chronic ulcer of unspecified part of right lower leg with unspecified severity: Secondary | ICD-10-CM

## 2019-07-20 DIAGNOSIS — L97519 Non-pressure chronic ulcer of other part of right foot with unspecified severity: Secondary | ICD-10-CM | POA: Diagnosis not present

## 2019-07-20 DIAGNOSIS — E039 Hypothyroidism, unspecified: Secondary | ICD-10-CM | POA: Diagnosis not present

## 2019-07-20 DIAGNOSIS — L03031 Cellulitis of right toe: Secondary | ICD-10-CM

## 2019-07-20 DIAGNOSIS — L97413 Non-pressure chronic ulcer of right heel and midfoot with necrosis of muscle: Secondary | ICD-10-CM

## 2019-07-20 HISTORY — PX: INCISION AND DRAINAGE OF WOUND: SHX1803

## 2019-07-20 LAB — COMPREHENSIVE METABOLIC PANEL
ALT: 11 U/L (ref 0–44)
ALT: 10 U/L (ref 0–44)
AST: 13 U/L — ABNORMAL LOW (ref 15–41)
AST: 11 U/L — ABNORMAL LOW (ref 15–41)
Albumin: 3.4 g/dL — ABNORMAL LOW (ref 3.5–5.0)
Albumin: 2.9 g/dL — ABNORMAL LOW (ref 3.5–5.0)
Alkaline Phosphatase: 112 U/L (ref 38–126)
Alkaline Phosphatase: 95 U/L (ref 38–126)
Anion gap: 12 (ref 5–15)
Anion gap: 10 (ref 5–15)
BUN: 19 mg/dL (ref 6–20)
BUN: 23 mg/dL — ABNORMAL HIGH (ref 6–20)
CO2: 29 mmol/L (ref 22–32)
CO2: 26 mmol/L (ref 22–32)
Calcium: 9.1 mg/dL (ref 8.9–10.3)
Calcium: 8.8 mg/dL — ABNORMAL LOW (ref 8.9–10.3)
Chloride: 98 mmol/L (ref 98–111)
Chloride: 102 mmol/L (ref 98–111)
Creatinine, Ser: 1.16 mg/dL — ABNORMAL HIGH (ref 0.44–1.00)
Creatinine, Ser: 1.11 mg/dL — ABNORMAL HIGH (ref 0.44–1.00)
GFR calc Af Amer: >60 mL/min (ref >60)
GFR calc Af Amer: >60 mL/min (ref >60)
GFR calc non Af Amer: 54 mL/min — ABNORMAL LOW (ref >60)
GFR calc non Af Amer: 57 mL/min — ABNORMAL LOW (ref >60)
Glucose, Bld: 260 mg/dL — ABNORMAL HIGH (ref 70–99)
Glucose, Bld: 164 mg/dL — ABNORMAL HIGH (ref 70–99)
Potassium: 4.2 mmol/L (ref 3.5–5.1)
Potassium: 4.5 mmol/L (ref 3.5–5.1)
Sodium: 139 mmol/L (ref 135–145)
Sodium: 138 mmol/L (ref 135–145)
Total Bilirubin: 0.2 mg/dL — ABNORMAL LOW (ref 0.3–1.2)
Total Bilirubin: 0.4 mg/dL (ref 0.3–1.2)
Total Protein: 7.9 g/dL (ref 6.5–8.1)
Total Protein: 7 g/dL (ref 6.5–8.1)

## 2019-07-20 LAB — GLUCOSE, CAPILLARY
Glucose-Capillary: 140 mg/dL — ABNORMAL HIGH (ref 70–99)
Glucose-Capillary: 155 mg/dL — ABNORMAL HIGH (ref 70–99)
Glucose-Capillary: 162 mg/dL — ABNORMAL HIGH (ref 70–99)
Glucose-Capillary: 163 mg/dL — ABNORMAL HIGH (ref 70–99)
Glucose-Capillary: 287 mg/dL — ABNORMAL HIGH (ref 70–99)
Glucose-Capillary: 385 mg/dL — ABNORMAL HIGH (ref 70–99)
Glucose-Capillary: 99 mg/dL (ref 70–99)

## 2019-07-20 LAB — TYPE AND SCREEN
ABO/RH(D): O POS
Antibody Screen: NEGATIVE

## 2019-07-20 LAB — C-REACTIVE PROTEIN: CRP: 2.5 mg/dL — ABNORMAL HIGH (ref <1.0)

## 2019-07-20 LAB — PROTIME-INR
INR: 1 (ref 0.8–1.2)
Prothrombin Time: 12.9 seconds (ref 11.4–15.2)

## 2019-07-20 LAB — MAGNESIUM: Magnesium: 2.1 mg/dL (ref 1.7–2.4)

## 2019-07-20 LAB — CBC
HCT: 38.7 % (ref 36.0–46.0)
Hemoglobin: 12.1 g/dL (ref 12.0–15.0)
MCH: 27.6 pg (ref 26.0–34.0)
MCHC: 31.3 g/dL (ref 30.0–36.0)
MCV: 88.2 fL (ref 80.0–100.0)
Platelets: 448 10*3/uL — ABNORMAL HIGH (ref 150–400)
RBC: 4.39 MIL/uL (ref 3.87–5.11)
RDW: 13.3 % (ref 11.5–15.5)
WBC: 9.9 10*3/uL (ref 4.0–10.5)
nRBC: 0 % (ref 0.0–0.2)

## 2019-07-20 LAB — ABO/RH: ABO/RH(D): O POS

## 2019-07-20 LAB — SARS CORONAVIRUS 2 (TAT 6-24 HRS): SARS Coronavirus 2: NEGATIVE

## 2019-07-20 LAB — HEMOGLOBIN A1C
Hgb A1c MFr Bld: 7.8 % — ABNORMAL HIGH (ref 4.8–5.6)
Mean Plasma Glucose: 177.16 mg/dL

## 2019-07-20 LAB — SEDIMENTATION RATE: Sed Rate: 67 mm/hr — ABNORMAL HIGH (ref 0–22)

## 2019-07-20 LAB — HIV ANTIBODY (ROUTINE TESTING W REFLEX): HIV Screen 4th Generation wRfx: NONREACTIVE

## 2019-07-20 LAB — PHOSPHORUS: Phosphorus: 4.8 mg/dL — ABNORMAL HIGH (ref 2.5–4.6)

## 2019-07-20 LAB — TSH: TSH: 1.575 u[IU]/mL (ref 0.350–4.500)

## 2019-07-20 LAB — MRSA PCR SCREENING: MRSA by PCR: NEGATIVE

## 2019-07-20 LAB — PREALBUMIN: Prealbumin: 13.2 mg/dL — ABNORMAL LOW (ref 18–38)

## 2019-07-20 SURGERY — IRRIGATION AND DEBRIDEMENT WOUND
Anesthesia: General | Site: Foot | Laterality: Right

## 2019-07-20 MED ORDER — EPHEDRINE SULFATE-NACL 50-0.9 MG/10ML-% IV SOSY
PREFILLED_SYRINGE | INTRAVENOUS | Status: DC | PRN
  Administered 2019-07-20: 5 mg via INTRAVENOUS

## 2019-07-20 MED ORDER — FENTANYL CITRATE (PF) 100 MCG/2ML IJ SOLN
INTRAMUSCULAR | Status: DC | PRN
  Administered 2019-07-20 (×2): 50 ug via INTRAVENOUS

## 2019-07-20 MED ORDER — VANCOMYCIN HCL 10 G IV SOLR
1500.0000 mg | Freq: Every day | INTRAVENOUS | Status: DC
  Administered 2019-07-21 – 2019-07-22 (×2): 1500 mg via INTRAVENOUS
  Filled 2019-07-20 (×2): qty 1500

## 2019-07-20 MED ORDER — LIDOCAINE 2% (20 MG/ML) 5 ML SYRINGE
INTRAMUSCULAR | Status: DC | PRN
  Administered 2019-07-20: 100 mg via INTRAVENOUS

## 2019-07-20 MED ORDER — MIDAZOLAM HCL 5 MG/5ML IJ SOLN
INTRAMUSCULAR | Status: DC | PRN
  Administered 2019-07-20: 2 mg via INTRAVENOUS

## 2019-07-20 MED ORDER — OXYCODONE HCL 5 MG/5ML PO SOLN: 5.0000 mg | Freq: Once | ORAL | Status: DC | PRN

## 2019-07-20 MED ORDER — DEXAMETHASONE SODIUM PHOSPHATE 10 MG/ML IJ SOLN
INTRAMUSCULAR | Status: DC | PRN
  Administered 2019-07-20: 4 mg via INTRAVENOUS

## 2019-07-20 MED ORDER — SODIUM CHLORIDE 0.9% FLUSH: 10.0000 mL | INTRAVENOUS | Status: DC | PRN

## 2019-07-20 MED ORDER — BUPIVACAINE HCL 0.5 % IJ SOLN
INTRAMUSCULAR | Status: DC | PRN
  Administered 2019-07-20: 10 mL

## 2019-07-20 MED ORDER — VANCOMYCIN HCL 1000 MG IV SOLR
INTRAVENOUS | Status: AC
  Filled 2019-07-20: qty 1000

## 2019-07-20 MED ORDER — MIDAZOLAM HCL 2 MG/2ML IJ SOLN
INTRAMUSCULAR | Status: AC
  Filled 2019-07-20: qty 2

## 2019-07-20 MED ORDER — PROPOFOL 10 MG/ML IV BOLUS
INTRAVENOUS | Status: DC | PRN
  Administered 2019-07-20: 150 mg via INTRAVENOUS

## 2019-07-20 MED ORDER — MORPHINE SULFATE (PF) 4 MG/ML IV SOLN
1.0000 mg | INTRAVENOUS | Status: DC | PRN
  Administered 2019-07-20 (×2): 2 mg via INTRAVENOUS

## 2019-07-20 MED ORDER — PROMETHAZINE HCL 25 MG/ML IJ SOLN: 6.2500 mg | INTRAMUSCULAR | Status: DC | PRN

## 2019-07-20 MED ORDER — ONDANSETRON HCL 4 MG/2ML IJ SOLN
INTRAMUSCULAR | Status: DC | PRN
  Administered 2019-07-20: 4 mg via INTRAVENOUS

## 2019-07-20 MED ORDER — 0.9 % SODIUM CHLORIDE (POUR BTL) OPTIME
TOPICAL | Status: DC | PRN
  Administered 2019-07-20: 15:00:00 1000 mL

## 2019-07-20 MED ORDER — BUPIVACAINE HCL (PF) 0.5 % IJ SOLN
INTRAMUSCULAR | Status: AC
  Filled 2019-07-20: qty 30

## 2019-07-20 MED ORDER — FENTANYL CITRATE (PF) 100 MCG/2ML IJ SOLN
INTRAMUSCULAR | Status: AC
  Filled 2019-07-20: qty 2

## 2019-07-20 MED ORDER — GLYCOPYRROLATE PF 0.2 MG/ML IJ SOSY
PREFILLED_SYRINGE | INTRAMUSCULAR | Status: DC | PRN
  Administered 2019-07-20: .2 mg via INTRAVENOUS

## 2019-07-20 MED ORDER — OXYCODONE HCL 5 MG PO TABS: 5.0000 mg | ORAL_TABLET | Freq: Once | ORAL | Status: DC | PRN

## 2019-07-20 MED ORDER — PHENYLEPHRINE 40 MCG/ML (10ML) SYRINGE FOR IV PUSH (FOR BLOOD PRESSURE SUPPORT)
PREFILLED_SYRINGE | INTRAVENOUS | Status: AC
  Filled 2019-07-20: qty 20

## 2019-07-20 MED ORDER — INSULIN GLARGINE 100 UNIT/ML ~~LOC~~ SOLN
20.0000 [IU] | Freq: Every day | SUBCUTANEOUS | Status: DC
  Administered 2019-07-20 (×2): 20 [IU] via SUBCUTANEOUS
  Filled 2019-07-20 (×4): qty 0.2

## 2019-07-20 MED ORDER — MORPHINE SULFATE (PF) 10 MG/ML IV SOLN
INTRAVENOUS | Status: AC
  Filled 2019-07-20: qty 1

## 2019-07-20 MED ORDER — SODIUM CHLORIDE 0.9 % IR SOLN
Status: DC | PRN
  Administered 2019-07-20: 3000 mL

## 2019-07-20 MED ORDER — PIPERACILLIN-TAZOBACTAM 3.375 G IVPB
3.3750 g | Freq: Three times a day (TID) | INTRAVENOUS | Status: DC
  Administered 2019-07-20 – 2019-07-25 (×14): 3.375 g via INTRAVENOUS
  Filled 2019-07-20 (×19): qty 50

## 2019-07-20 MED ORDER — PHENYLEPHRINE 40 MCG/ML (10ML) SYRINGE FOR IV PUSH (FOR BLOOD PRESSURE SUPPORT)
PREFILLED_SYRINGE | INTRAVENOUS | Status: DC | PRN
  Administered 2019-07-20: 120 ug via INTRAVENOUS
  Administered 2019-07-20: 80 ug via INTRAVENOUS
  Administered 2019-07-20: 120 ug via INTRAVENOUS

## 2019-07-20 SURGICAL SUPPLY — 42 items
APL PRP STRL LF DISP 70% ISPRP (MISCELLANEOUS) ×1
BLADE HEX COATED 2.75 (ELECTRODE) ×2 IMPLANT
BLADE SURG 15 STRL LF DISP TIS (BLADE) ×1 IMPLANT
BLADE SURG 15 STRL SS (BLADE) ×2
BNDG CMPR 9X4 STRL LF SNTH (GAUZE/BANDAGES/DRESSINGS) ×1
BNDG COHESIVE 4X5 TAN NS LF (GAUZE/BANDAGES/DRESSINGS) IMPLANT
BNDG ELASTIC 3X5.8 VLCR STR LF (GAUZE/BANDAGES/DRESSINGS) ×2 IMPLANT
BNDG ELASTIC 4X5.8 VLCR STR LF (GAUZE/BANDAGES/DRESSINGS) ×1 IMPLANT
BNDG ESMARK 4X9 LF (GAUZE/BANDAGES/DRESSINGS) ×2 IMPLANT
BNDG GAUZE ELAST 4 BULKY (GAUZE/BANDAGES/DRESSINGS) ×2 IMPLANT
CHLORAPREP W/TINT 26 (MISCELLANEOUS) ×2 IMPLANT
COVER BACK TABLE 60X90IN (DRAPES) ×2 IMPLANT
CUFF TOURN SGL QUICK 18X4 (TOURNIQUET CUFF) ×2 IMPLANT
DRAPE POUCH INSTRU U-SHP 10X18 (DRAPES) ×2 IMPLANT
DRAPE SHEET LG 3/4 BI-LAMINATE (DRAPES) ×4 IMPLANT
DRSG PAD ABDOMINAL 8X10 ST (GAUZE/BANDAGES/DRESSINGS) IMPLANT
ELECT PENCIL ROCKER SW 15FT (MISCELLANEOUS) ×2 IMPLANT
GAUZE SPONGE 4X4 12PLY STRL (GAUZE/BANDAGES/DRESSINGS) ×2 IMPLANT
GAUZE XEROFORM 1X8 LF (GAUZE/BANDAGES/DRESSINGS) ×1 IMPLANT
GLOVE BIO SURGEON STRL SZ7.5 (GLOVE) ×2 IMPLANT
GLOVE BIOGEL PI IND STRL 8 (GLOVE) ×1 IMPLANT
GLOVE BIOGEL PI INDICATOR 8 (GLOVE) ×1
GOWN STRL REUS W/TWL XL LVL3 (GOWN DISPOSABLE) ×4 IMPLANT
KIT BASIN OR (CUSTOM PROCEDURE TRAY) ×2 IMPLANT
KIT TURNOVER KIT A (KITS) IMPLANT
NDL HYPO 25X1 1.5 SAFETY (NEEDLE) ×1 IMPLANT
NEEDLE HYPO 25X1 1.5 SAFETY (NEEDLE) ×2 IMPLANT
PACK GENERAL/GYN (CUSTOM PROCEDURE TRAY) ×1 IMPLANT
SET IRRIG Y TYPE TUR BLADDER L (SET/KITS/TRAYS/PACK) IMPLANT
STAPLER VISISTAT 35W (STAPLE) ×2 IMPLANT
STOCKINETTE 6  STRL (DRAPES) ×1
STOCKINETTE 6 STRL (DRAPES) ×1 IMPLANT
SUT ETHILON 4 0 PS 2 18 (SUTURE) IMPLANT
SUT MNCRL AB 3-0 PS2 18 (SUTURE) IMPLANT
SUT MON AB 5-0 PS2 18 (SUTURE) IMPLANT
SUT VIC AB 3-0 PS2 18 (SUTURE)
SUT VIC AB 3-0 PS2 18XBRD (SUTURE) IMPLANT
SUT VIC AB 4-0 PS2 18 (SUTURE) ×2 IMPLANT
SWAB COLLECTION DEVICE MRSA (MISCELLANEOUS) ×1 IMPLANT
SWAB CULTURE ESWAB REG 1ML (MISCELLANEOUS) ×1 IMPLANT
SYR CONTROL 10ML LL (SYRINGE) ×2 IMPLANT
YANKAUER SUCT BULB TIP 10FT TU (MISCELLANEOUS) ×2 IMPLANT

## 2019-07-20 NOTE — Anesthesia Procedure Notes (Signed)
Procedure Name: Intubation Date/Time: 07/20/2019 2:25 PM Performed by: Silas [place], CRNA Pre-anesthesia Checklist: Patient identified, Emergency Drugs available, Suction available and Patient being monitored Patient Re-evaluated:Patient Re-evaluated prior to induction Oxygen Delivery Method: Circle system utilized Preoxygenation: Pre-oxygenation with 100% oxygen Induction Type: IV induction Ventilation: Mask ventilation without difficulty LMA: LMA with gastric port inserted LMA Size: 4.0 Tube type: Oral Number of attempts: 1 Airway Equipment and Method: Stylet and Oral airway Placement Confirmation: ETT inserted through vocal cords under direct vision,  positive ETCO2 and breath sounds checked- equal and bilateral Tube secured with: Tape Dental Injury: Teeth and Oropharynx as per pre-operative assessment

## 2019-07-20 NOTE — Transfer of Care (Signed)
Immediate Anesthesia Transfer of Care Note  Patient: Katherine Herrera  Procedure(s) Performed: IRRIGATION AND DEBRIDEMENT foot (Right Foot)  Patient Location: PACU  Anesthesia Type:General  Level of Consciousness: awake and patient cooperative  Airway & Oxygen Therapy: Patient Spontanous Breathing and Patient connected to face mask oxygen  Post-op Assessment: Report given to RN and Post -op Vital signs reviewed and stable  Post vital signs: Reviewed and stable  Last Vitals:  Vitals Value Taken Time  BP 124/76 07/20/19 1546  Temp 36.4 C 07/20/19 1508  Pulse 74 07/20/19 1547  Resp 11 07/20/19 1547  SpO2 95 % 07/20/19 1547  Vitals shown include unvalidated device data.  Last Pain:  Vitals:   07/20/19 1530  TempSrc:   PainSc: Asleep      Patients Stated Pain Goal: 4 ([id number])  Complications: No apparent anesthesia complications

## 2019-07-20 NOTE — Progress Notes (Signed)
OT Cancellation Note  Patient Details Name: Katherine Herrera MRN: [id number] DOB: 02-23-1966   Cancelled Treatment:    Reason Eval/Treat Not Completed: Other (comment). Plan is for sx.  [place] 07/20/2019, 12:17 PM  Lesle Chris, OTR/L Acute Rehabilitation Services [phone] [facility] pager [phone] office 07/20/2019

## 2019-07-20 NOTE — Progress Notes (Signed)
PROGRESS NOTE                                                                                                                                                                                                             Patient Demographics:    Katherine Herrera, is a 53 y.o. female, DOB - June 25, 1966, [id number]  Admit date - 07/19/2019   Admitting Physician Toy Baker, MD  Outpatient Primary MD for the patient is Plotnikov, [name], MD  LOS - 1  Outpatient Specialists: Podiatry  No chief complaint on file.      Brief Narrative 53 y/o female with history of type 2 diabetes mellitus, Charcot deformity of right foot status post fifth metatarsal resection recently, hypothyroidism, COPD, chronic pain and hyperlipidemia presented with worsened wound over the right foot metatarsal resection that she had about 2 weeks back.  She was sent home on wound VAC and Augmentin but about 6 days back the wound VAC stopped working.  She was seen in the office by her podiatrist on the day of admission and recommended her to be admitted to the hospital for IV antibiotics and surgical intervention. Patient denies any fevers, chills or nausea.  Reports that her CBG has been running in the 50s to 60s last few days despite adequate meal intake.    Subjective:   Complains of pain in her right foot.   Assessment  & Plan :   Principal problem Right diabetic foot ulcer, with poor healing On empiric IV vancomycin and Zosyn.  Blood cultures sent.  Patient n.p.o. for surgical intervention by podiatry today. Pain control with PRN tramadol and Dilaudid.  Type 2 diabetes mellitus, uncontrolled A1c of 7.8.  Monitor on Lantus 20 units daily and sliding scale coverage.  CBG in 150-250 range.  Hypothyroidism Continue Synthroid  Diabetic polyneuropathy Continue Neurontin  Tobacco abuse Counseled on cessation  Chronic pain Home meds  continued   Code Status : Full code  Family Communication  : None  Disposition Plan  : Home once clinically improved  Barriers For Discharge : Active symptoms  Consults  : Podiatry  Procedures  : None  DVT Prophylaxis  :  Lovenox -   Lab Results  Component Value Date   PLT 448 (H) 07/20/2019    Antibiotics  :    Anti-infectives (From admission, onward)  Start     Dose/Rate Route Frequency Ordered Stop   07/21/19 0300  vancomycin (VANCOCIN) 1,500 mg in sodium chloride 0.9 % 500 mL IVPB     1,500 mg 250 mL/hr over 120 Minutes Intravenous Daily 07/20/19 0120     07/20/19 0900  piperacillin-tazobactam (ZOSYN) IVPB 3.375 g     3.375 g 12.5 mL/hr over 240 Minutes Intravenous Every 8 hours 07/20/19 0120     07/19/19 2230  vancomycin (VANCOCIN) 2,000 mg in sodium chloride 0.9 % 500 mL IVPB     2,000 mg 250 mL/hr over 120 Minutes Intravenous  Once 07/19/19 2200 07/20/19 0243   07/19/19 2200  piperacillin-tazobactam (ZOSYN) IVPB 3.375 g     3.375 g 100 mL/hr over 30 Minutes Intravenous NOW 07/19/19 2200 07/20/19 0317        Objective:   Vitals:   07/19/19 2335 07/20/19 0040 07/20/19 0455 07/20/19 1000  BP:  119/67 109/74 111/69  Pulse:  88 72 67  Resp:  16 16 16   Temp:  98.7 F (37.1 C) 98.1 F (36.7 C) 98.7 F (37.1 C)  TempSrc:  Oral Oral Oral  SpO2:  91% 92% 92%  Weight: 101.8 kg     Height: 5\' 9"  (1.753 m)       Wt Readings from Last 3 Encounters:  07/19/19 101.8 kg  07/05/19 103.8 kg  07/01/19 103.9 kg     Intake/Output Summary (Last 24 hours) at 07/20/2019 1159 Last data filed at 07/20/2019 0600 Gross per 24 hour  Intake 587.68 ml  Output --  Net 587.68 ml     Physical Exam  Gen: not in distress HEENT:moist mucosa, supple neck Chest: clear b/l, no added sounds CVS: N S1&S2, no murmurs,  GI: soft, NT, ND,  Musculoskeletal: Warm, no edema, dressing over right foot     Data Review:    CBC Recent Labs  Lab 07/19/19 2320 07/20/19 0451   WBC 11.4* 9.9  HGB 13.6 12.1  HCT 43.1 38.7  PLT 492* 448*  MCV 88.3 88.2  MCH 27.9 27.6  MCHC 31.6 31.3  RDW 13.4 13.3  LYMPHSABS 3.9  --   MONOABS 0.7  --   EOSABS 0.4  --   BASOSABS 0.1  --     Chemistries  Recent Labs  Lab 07/19/19 2320 07/20/19 0451  NA 139 138  K 4.2 4.5  CL 98 102  CO2 29 26  GLUCOSE 260* 164*  BUN 19 23*  CREATININE 1.16* 1.11*  CALCIUM 9.1 8.8*  MG  --  2.1  AST 13* 11*  ALT 11 10  ALKPHOS 112 95  BILITOT 0.2* 0.4   ------------------------------------------------------------------------------------------------------------------ No results for input(s): CHOL, HDL, LDLCALC, TRIG, CHOLHDL, LDLDIRECT in the last 72 hours.  Lab Results  Component Value Date   HGBA1C 7.8 (H) 07/19/2019   ------------------------------------------------------------------------------------------------------------------ Recent Labs    07/20/19 0451  TSH 1.575   ------------------------------------------------------------------------------------------------------------------ No results for input(s): VITAMINB12, FOLATE, FERRITIN, TIBC, IRON, RETICCTPCT in the last 72 hours.  Coagulation profile Recent Labs  Lab 07/19/19 2320  INR 1.0    No results for input(s): DDIMER in the last 72 hours.  Cardiac Enzymes No results for input(s): CKMB, TROPONINI, MYOGLOBIN in the last 168 hours.  Invalid input(s): CK ------------------------------------------------------------------------------------------------------------------    Component Value Date/Time   BNP 58.0 09/28/2018 2112    Inpatient Medications  Scheduled Meds:  baclofen  20 mg Oral BID   ezetimibe  10 mg Oral Daily   gabapentin  800  mg Oral QID   insulin aspart  0-9 Units Subcutaneous Q4H   insulin glargine  20 Units Subcutaneous QHS   lamoTRIgine  25 mg Oral BID   levothyroxine  75 mcg Oral Daily   rOPINIRole  0.5 mg Oral QHS   senna  1 tablet Oral BID   Continuous  Infusions:  sodium chloride 250 mL (07/20/19 0042)   piperacillin-tazobactam (ZOSYN)  IV 3.375 g (07/20/19 1000)   [START ON 07/21/2019] vancomycin     PRN Meds:.sodium chloride, acetaminophen **OR** acetaminophen, clonazePAM, HYDROmorphone, loratadine, ondansetron **OR** ondansetron (ZOFRAN) IV, polyethylene glycol, sodium chloride flush, sodium chloride flush, traMADol  Micro Results Recent Results (from the past 240 hour(s))  SARS CORONAVIRUS 2 (TAT 6-24 HRS) Nasopharyngeal Nasopharyngeal Swab     Status: None   Collection Time: 07/19/19  9:43 PM   Specimen: Nasopharyngeal Swab  Result Value Ref Range Status   SARS Coronavirus 2 NEGATIVE NEGATIVE Final    Comment: (NOTE) SARS-CoV-2 target nucleic acids are NOT DETECTED. The SARS-CoV-2 RNA is generally detectable in upper and lower respiratory specimens during the acute phase of infection. Negative results do not preclude SARS-CoV-2 infection, do not rule out co-infections with other pathogens, and should not be used as the sole basis for treatment or other patient management decisions. Negative results must be combined with clinical observations, patient history, and epidemiological information. The expected result is Negative. Fact Sheet for Patients: SugarRoll.be Fact Sheet for Healthcare Providers: https://www.woods-mathews.com/ This test is not yet approved or cleared by the [place] FDA and  has been authorized for detection and/or diagnosis of SARS-CoV-2 by FDA under an Emergency Use Authorization (EUA). This EUA will remain  in effect (meaning this test can be used) for the duration of the COVID-19 declaration under Section 56 4(b)(1) of the Act, 21 U.S.C. section 360bbb-3(b)(1), unless the authorization is terminated or revoked sooner. Performed at [facility], [place] [address], [place], [place] [address]   MRSA PCR Screening     Status: None   Collection Time:  07/19/19  9:54 PM   Specimen: Nasal Mucosa; Nasopharyngeal  Result Value Ref Range Status   MRSA by PCR NEGATIVE NEGATIVE Final    Comment:        The GeneXpert MRSA Assay (FDA approved for NASAL specimens only), is one component of a comprehensive MRSA colonization surveillance program. It is not intended to diagnose MRSA infection nor to guide or monitor treatment for MRSA infections. Performed at [facility], [place] [address], [place], [place] [address]   Blood Cultures x 2 sites     Status: None (Preliminary result)   Collection Time: 07/19/19 11:20 PM   Specimen: BLOOD  Result Value Ref Range Status   Specimen Description   Final    BLOOD BLOOD RIGHT FOREARM Performed at [facility] [facility] [address], [place], [place] [address]    Special Requests   Final    BOTTLES DRAWN AEROBIC ONLY Blood Culture results may not be optimal due to an excessive volume of blood received in culture bottles Performed at [facility] [address], [place], [place] [address]    Culture PENDING  Incomplete   Report Status PENDING  Incomplete  Blood Cultures x 2 sites     Status: None (Preliminary result)   Collection Time: 07/19/19 11:20 PM   Specimen: BLOOD  Result Value Ref Range Status   Specimen Description   Final    BLOOD RIGHT ANTECUBITAL Performed  at [facility], [place] [address] [place], [place] [address]    Special Requests   Final    BOTTLES DRAWN AEROBIC ONLY Blood Culture results may not be optimal due to an excessive volume of blood received in culture bottles Performed at [facility] [address] [place], [place] [address]    Culture PENDING  Incomplete   Report Status PENDING  Incomplete    Radiology Reports Mr Foot Right Wo Contrast  Result Date: 07/05/2019 CLINICAL DATA:  Diabetic with right foot ulcer and cellulitis. Limited response to antibiotics. Clinical concern of osteomyelitis. EXAM: MRI  OF THE RIGHT FOREFOOT WITHOUT CONTRAST TECHNIQUE: Multiplanar, multisequence MR imaging of the right forefoot was performed. No intravenous contrast was administered. COMPARISON:  Radiographs 06/20/2019, 04/02/2019 and 01/02/2019. FINDINGS: Bones/Joint/Cartilage Patient underwent resection of the 5th metatarsal head on 01/02/2019. The amputation site appears sharp. There is periosteal thickening involving the base of the 5th metatarsal which appears chronic. Low-level marrow T2 hyperintensity is present within the base of the 5th metatarsal without corresponding T1 signal abnormality or cortical destruction. There are overlying soft tissue changes with inflammation and possible soft tissue emphysema in this area. There is mild T2 hyperintensity laterally in the cuboid without corresponding T1 signal abnormality or cortical destruction. Apparent arthropathy at the 2nd DIP joint. No other focal osseous lesions are identified. There are mild degenerative changes in the midfoot. Ligaments Intact Lisfranc ligament. Muscles and Tendons Diffuse forefoot muscular atrophy without focal fluid collection. No significant tenosynovitis. Soft tissues There is skin irregularity and ulceration along the plantar and lateral aspect of the 5th metatarsal base. There are inflammatory changes and possible soft tissue emphysema in the subcutaneous fat. There is no well-defined fluid collection. As noted above, the soft tissue changes are adjacent to marrow changes in the base of the 5th metatarsal. IMPRESSION: 1. Soft tissue ulceration along the plantar and lateral aspect of the 5th metatarsal base with underlying inflammatory changes and possible soft tissue emphysema. These findings are in close proximity to T2 marrow changes in the base of the 5th metatarsal, suspicious for early osteomyelitis. There is no cortical destruction. 2. 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 3. Apparent arthropathy at the 2nd DIP joint.  Electronically Signed   By: Richardean Sale M.D.   On: 07/05/2019 12:46   Dg Foot 2 Views Right  Result Date: 07/05/2019 CLINICAL DATA:  Status post right fifth metatarsal resection, peroneal tenodesis, ulcer repair EXAM: RIGHT FOOT - 2 VIEW COMPARISON:  06/24/2019 FINDINGS: Redemonstrated postoperative findings of distal right fifth metatarsal amputation with new overlying skin staples about the base of the right fifth metatarsal. There is soft tissue edema about the midfoot and ankle. Mild right first metatarsophalangeal arthrosis. IMPRESSION: Redemonstrated postoperative findings of distal right fifth metatarsal amputation with new overlying skin staples about the base of the right fifth metatarsal. There is soft tissue edema about the midfoot and ankle. Electronically Signed   By: Eddie Candle M.D.   On: 07/05/2019 16:45   Dg Foot Complete Right  Result Date: 07/01/2019 Please see detailed radiograph report in office note.  Vas [place] Burnard Bunting With/wo Tbi  Result Date: 07/20/2019 LOWER EXTREMITY DOPPLER STUDY Indications: Ulceration. High Risk Factors: Diabetes. Other Factors: Post op foot infection worsening even with antibiotics. Admitted                for IV antibiotics and I&D of foot.  Vascular Interventions: 07/05/19 right 5th metatarsal resection. Comparison Study: 09/24/12  WNL Performing Technologist: June Leap Rdms, Rvt  Examination Guidelines: A complete evaluation includes at minimum, Doppler waveform signals and systolic blood pressure reading at the level of bilateral brachial, anterior tibial, and posterior tibial arteries, when vessel segments are accessible. Bilateral testing is considered an integral part of a complete examination. Photoelectric Plethysmograph (PPG) waveforms and toe systolic pressure readings are included as required and additional duplex testing as needed. Limited examinations for reoccurring indications may be performed as noted.  ABI Findings:  +---------+------------------+-----+---------+-------------------------+  Right     Rt Pressure (mmHg) Index Waveform  Comment                    +---------+------------------+-----+---------+-------------------------+  Brachial                           triphasic BP not obtained due to IV  +---------+------------------+-----+---------+-------------------------+  ATA       148                1.33  biphasic                             +---------+------------------+-----+---------+-------------------------+  PTA       142                1.28  triphasic                            +---------+------------------+-----+---------+-------------------------+  Great Toe 142                1.28  Abnormal                             +---------+------------------+-----+---------+-------------------------+ +---------+------------------+-----+---------+-------+  Left      Lt Pressure (mmHg) Index Waveform  Comment  +---------+------------------+-----+---------+-------+  Brachial  111                      triphasic          +---------+------------------+-----+---------+-------+  ATA       146                1.32  triphasic          +---------+------------------+-----+---------+-------+  PTA       155                1.40  biphasic           +---------+------------------+-----+---------+-------+  Great Toe 141                1.27  Abnormal           +---------+------------------+-----+---------+-------+  Summary: Right: Resting right ankle-brachial index indicates noncompressible right lower extremity arteries. The right toe-brachial index is normal. Left: Resting left ankle-brachial index indicates noncompressible left lower extremity arteries. The left toe-brachial index is normal.  *See table(s) above for measurements and observations.     Preliminary    Vas [place] Lower Extremity Venous (dvt)  Result Date: 07/08/2019  Lower Venous Study Indications: Pain. Other Indications: Diabetic ulcer with necrosis of bone, osteomyelitis.  Limitations: Body habitus, edema, poor ultrasound/tissue interface and orthopaedic appliance. Comparison Study: Prior negative left lower extremity venous duplex from 05/12/17                   is available for comparison.  Performing Technologist: Sharion Dove RVS  Examination Guidelines: A complete evaluation includes B-mode imaging, spectral Doppler, color Doppler, and power Doppler as needed of all accessible portions of each vessel. Bilateral testing is considered an integral part of a complete examination. Limited examinations for reoccurring indications may be performed as noted.  +---------+---------------+---------+-----------+----------+------------------+  RIGHT     Compressibility Phasicity Spontaneity Properties Thrombus Aging      +---------+---------------+---------+-----------+----------+------------------+  CFV       Full            Yes       Yes                                        +---------+---------------+---------+-----------+----------+------------------+  SFJ       Full                                                                 +---------+---------------+---------+-----------+----------+------------------+  FV Prox   Full                                                                 +---------+---------------+---------+-----------+----------+------------------+  FV Mid    Full                                                                 +---------+---------------+---------+-----------+----------+------------------+  FV Distal Full                                                                 +---------+---------------+---------+-----------+----------+------------------+  PFV       Full                                                                 +---------+---------------+---------+-----------+----------+------------------+  POP                       Yes       Yes                    Patent by color  Doppler              +---------+---------------+---------+-----------+----------+------------------+  PTV                                                        Not visualized      +---------+---------------+---------+-----------+----------+------------------+  PERO                                                       Not visualized      +---------+---------------+---------+-----------+----------+------------------+   +----+---------------+---------+-----------+----------+--------------+  LEFT Compressibility Phasicity Spontaneity Properties Thrombus Aging  +----+---------------+---------+-----------+----------+--------------+  CFV  Full            Yes       Yes                                    +----+---------------+---------+-----------+----------+--------------+     Summary: Right: There is no evidence of deep vein thrombosis in the lower extremity. However, portions of this examination were limited- see technologist comments above. Left: No evidence of common femoral vein obstruction.  *See table(s) above for measurements and observations. Electronically signed by Harold Barban MD on 07/08/2019 at 1:11:38 AM.    Final     Time Spent in minutes  25   Fabiola Mudgett M.D on 07/20/2019 at 11:59 AM  Between 7am to 7pm - Pager - [phone]  After 7pm go to [email or web address] - password [facility]  [facility] -  Office  [phone]

## 2019-07-20 NOTE — Anesthesia Postprocedure Evaluation (Signed)
Anesthesia Post Note  Patient: Katherine Herrera  Procedure(s) Performed: IRRIGATION AND DEBRIDEMENT foot (Right Foot)     Patient location during evaluation: PACU Anesthesia Type: General Level of consciousness: sedated and patient cooperative Pain management: pain level controlled Vital Signs Assessment: post-procedure vital signs reviewed and stable Respiratory status: spontaneous breathing Cardiovascular status: stable Anesthetic complications: no    Last Vitals:  Vitals:   07/20/19 1545 07/20/19 1611  BP: 124/76 121/71  Pulse: 75 74  Resp: 12 18  Temp: 36.6 C 37 C  SpO2: 100% 93%    Last Pain:  Vitals:   07/20/19 1611  TempSrc: Oral  PainSc:                  [name]

## 2019-07-20 NOTE — Consult Note (Signed)
[facility] Nurse wound consult note Reason for Consult: Infected foot ulcer Wound type:Neuropathic foot ulceration Patient for surgical intervention by podiatry (Dr. March Rummage) today.  [facility] nursing team will not follow, but will remain available to this patient, the nursing and medical teams.  Please re-consult if needed. Thanks, Maudie Flakes, MSN, RN, [place], Arther Abbott  Pager# [phone]

## 2019-07-20 NOTE — Op Note (Signed)
Patient Name: Katherine Herrera DOB: Feb 10, 1966  MRN: [id number]   Date of Service: 07/20/2019  Surgeon: Dr. Hardie Pulley, DPM Assistants: None Pre-operative Diagnosis:  Right foot ulcer, cellulitis, wound dehiscence Post-operative Diagnosis:  Same Procedures:  1) Incision and drainage right foot wound below the deep fascia Pathology/Specimens: ID Type Source Tests Collected by Time Destination  A : deep wound right foot aerobic anaerobic gramstain Tissue PATH Other GRAM STAIN, AEROBIC/ANAEROBIC CULTURE (SURGICAL/DEEP WOUND) Evelina Bucy, DPM 07/20/2019 1440    Anesthesia: MAC/local Hemostasis: * No tourniquets in log * Estimated Blood Loss: 30 mL Materials: * No implants in log * Medications: 1g vancomycin powder Complications: none  Indications for Procedure:  This is a 53 y.o. female with a chronic right foot wound. Recently underwent incision and drainage, excision of the ulcer, 5th metatarsal resection for wound infection with early osteomyelitis. Was admitted for worsening cellulitis over the past 4 days. The wound had local erythema and dehiscence and increasing pain. All risks, benefits, and alternatives were discussed with the patient. No guarantees were given.   Procedure in Detail: Patient was identified in pre-operative holding area. Formal consent was signed and the right lower extremity was marked. Patient was brought back to the operating room. Anesthesia was induced. The extremity was prepped and draped in the usual sterile fashion. Timeout was taken to confirm patient name, laterality, and procedure prior to incision.   Attention was then directed to the right foot. The sutures and staples were removed. An incision was made through the dehisced area down through the deep fascia. The area was blunt dissected. No purulence was noted. A swab culture was taken of this area. The wound was sharply excisionally debrided of non-viable soft tissue. Debridement was down to the  level of the deep fascia. The area was thoroughly irrigated with 3L of NS via pulse lavage.   Post-debridement and irrigation the wound measured 3x1 with healthy viable tissue. The wound was packed with iodoform packing. Vancomycin powder was applied topically. The foot was then dressed with xeroform, 4x4, kerlix, ACE. Patient tolerated the procedure well.   Disposition: Following a period of post-operative monitoring, patient will be transferred back to the floor. She will benefit from RTOR for likely repeat debridement with possible wound VAC application. Will monitor clinically. Will check cultures for abx selection.

## 2019-07-20 NOTE — Anesthesia Preprocedure Evaluation (Signed)
Anesthesia Evaluation  Patient identified by MRN, date of birth, ID band Patient awake    Reviewed: Allergy & Precautions, NPO status , Patient's Chart, lab work & pertinent test results  Airway Mallampati: I  TM Distance: >3 FB Neck ROM: Full    Dental  (+) Teeth Intact, Dental Advisory Given   Pulmonary COPD, Current Smoker and Patient abstained from smoking.,    breath sounds clear to auscultation       Cardiovascular hypertension, Pt. on medications  Rhythm:Regular Rate:Normal     Neuro/Psych  Headaches, Anxiety    GI/Hepatic negative GI ROS, Neg liver ROS,   Endo/Other  diabetes, Type 2, Oral Hypoglycemic AgentsHypothyroidism   Renal/GU Renal disease     Musculoskeletal  (+) Arthritis ,   Abdominal Normal abdominal exam  (+)   Peds  Hematology   Anesthesia Other Findings - HLD  Reproductive/Obstetrics                             Lab Results  Component Value Date   WBC 9.9 07/20/2019   HGB 12.1 07/20/2019   HCT 38.7 07/20/2019   MCV 88.2 07/20/2019   PLT 448 (H) 07/20/2019   Lab Results  Component Value Date   CREATININE 1.11 (H) 07/20/2019   BUN 23 (H) 07/20/2019   NA 138 07/20/2019   K 4.5 07/20/2019   CL 102 07/20/2019   CO2 26 07/20/2019   Lab Results  Component Value Date   INR 1.0 07/19/2019   INR 0.95 01/28/2014   Echo: 1. The left ventricle has normal systolic function with an ejection fraction of 60-65%. The cavity size was normal. Left ventricular diastolic parameters were normal.  2. The right ventricle has normal systolic function. The cavity was normal. There is no increase in right ventricular wall thickness.  3. The mitral valve is normal in structure. There is mild mitral annular calcification present.  4. The tricuspid valve is normal in structure.  5. The aortic root and ascending aorta are normal in size and structure.  Anesthesia  Physical  Anesthesia Plan  ASA: III  Anesthesia Plan: General   Post-op Pain Management:    Induction: Intravenous  PONV Risk Score and Plan: 3 and Ondansetron, Dexamethasone and Midazolam  Airway Management Planned: LMA  Additional Equipment: None  Intra-op Plan:   Post-operative Plan: Extubation in OR  Informed Consent: I have reviewed the patients History and Physical, chart, labs and discussed the procedure including the risks, benefits and alternatives for the proposed anesthesia with the patient or authorized representative who has indicated his/her understanding and acceptance.     Dental advisory given  Plan Discussed with: CRNA  Anesthesia Plan Comments:         Anesthesia Quick Evaluation

## 2019-07-20 NOTE — Progress Notes (Addendum)
  Subjective:  Patient ID: Katherine Herrera, female    DOB: 18-Feb-1966,  MRN: [id number]  Patient seen bedside. Still having a lot of pain in the foot. States she doesn't feel that the antibiotics have made much of a difference yet.  Objective:   Vitals:   07/20/19 0455 07/20/19 1000  BP: 109/74 111/69  Pulse: 72 67  Resp: 16 16  Temp: 98.1 F (36.7 C) 98.7 F (37.1 C)  SpO2: 92% 92%   General AA&O x3. Normal mood and affect.  Vascular Dorsalis pedis and posterior tibial pulses 2/4 bilat. Brisk capillary refill to all digits. Pedal hair present.  Neurologic Epicritic sensation grossly diminished.  Dermatologic R foot wound with local cellulitis, warmth, minimal active drainage. Wound dehiscence noted centrally.  Orthopedic: MMT 5/5 in dorsiflexion, plantarflexion, inversion, and eversion. Normal joint ROM without pain or crepitus.   Assessment & Plan:  Patient was evaluated and treated and all questions answered.  Right Foot Cellulitis with wound dehiscence -Discsussed with patient she would benefit from debridement as she has not had much improvement on IV abx alone. Patient amenable. Consent reviewed and signed -RLE marked. -Continue empiric abx.  Will obtain wound culture intra-op. -WBAT RLE in CAM walker -Will follow post-op  To OR today for debridement of the surgical wound. Evelina Bucy, DPM  Accessible via secure chat for questions or concerns.

## 2019-07-20 NOTE — Progress Notes (Signed)
ABI       has been completed. Preliminary results can be found under CV proc through chart review. Jorge Retz, BS, RDMS, RVT   

## 2019-07-20 NOTE — Plan of Care (Signed)
  Problem: Education: Goal: Knowledge of General Education information will improve Description: Including pain rating scale, medication(s)/side effects and non-pharmacologic comfort measures Outcome: Progressing   Problem: Coping: Goal: Level of anxiety will decrease Outcome: Progressing   Problem: Pain Managment: Goal: General experience of comfort will improve Outcome: Progressing   

## 2019-07-20 NOTE — Progress Notes (Signed)
PT Cancellation Note  Patient Details Name: MISCHEL RAFFERTY MRN: [id number] DOB: 07-23-1966   Cancelled Treatment:    Reason Eval/Treat Not Completed: Medical issues which prohibited therapy, going to surgery today.   Claretha Cooper 07/20/2019, 12:58 PM  [place] Pager [phone] Office [phone]

## 2019-07-21 ENCOUNTER — Encounter (HOSPITAL_COMMUNITY): Payer: Self-pay | Admitting: Podiatry

## 2019-07-21 DIAGNOSIS — E0865 Diabetes mellitus due to underlying condition with hyperglycemia: Secondary | ICD-10-CM

## 2019-07-21 DIAGNOSIS — Z794 Long term (current) use of insulin: Secondary | ICD-10-CM

## 2019-07-21 LAB — GLUCOSE, CAPILLARY
Glucose-Capillary: 271 mg/dL — ABNORMAL HIGH (ref 70–99)
Glucose-Capillary: 301 mg/dL — ABNORMAL HIGH (ref 70–99)
Glucose-Capillary: 314 mg/dL — ABNORMAL HIGH (ref 70–99)
Glucose-Capillary: 356 mg/dL — ABNORMAL HIGH (ref 70–99)
Glucose-Capillary: 422 mg/dL — ABNORMAL HIGH (ref 70–99)
Glucose-Capillary: 482 mg/dL — ABNORMAL HIGH (ref 70–99)

## 2019-07-21 LAB — CREATININE, SERUM
Creatinine, Ser: 1.18 mg/dL — ABNORMAL HIGH (ref 0.44–1.00)
GFR calc Af Amer: >60 mL/min (ref >60)
GFR calc non Af Amer: 53 mL/min — ABNORMAL LOW (ref >60)

## 2019-07-21 MED ORDER — INSULIN GLARGINE 100 UNIT/ML ~~LOC~~ SOLN
35.0000 [IU] | Freq: Every day | SUBCUTANEOUS | Status: DC
  Administered 2019-07-21: 35 [IU] via SUBCUTANEOUS
  Filled 2019-07-21: qty 0.35

## 2019-07-21 MED ORDER — INSULIN ASPART 100 UNIT/ML ~~LOC~~ SOLN
0.0000 [IU] | Freq: Three times a day (TID) | SUBCUTANEOUS | Status: DC
  Administered 2019-07-21 (×2): 11 [IU] via SUBCUTANEOUS
  Administered 2019-07-22: 3 [IU] via SUBCUTANEOUS
  Administered 2019-07-22: 15 [IU] via SUBCUTANEOUS
  Administered 2019-07-22: 8 [IU] via SUBCUTANEOUS
  Administered 2019-07-23: 11 [IU] via SUBCUTANEOUS
  Administered 2019-07-23: 5 [IU] via SUBCUTANEOUS
  Administered 2019-07-23 – 2019-07-25 (×2): 3 [IU] via SUBCUTANEOUS

## 2019-07-21 MED ORDER — KETOROLAC TROMETHAMINE 30 MG/ML IJ SOLN
30.0000 mg | Freq: Four times a day (QID) | INTRAMUSCULAR | Status: DC | PRN
  Administered 2019-07-21 – 2019-07-25 (×9): 30 mg via INTRAVENOUS
  Filled 2019-07-21 (×11): qty 1

## 2019-07-21 MED ORDER — INSULIN ASPART 100 UNIT/ML ~~LOC~~ SOLN
4.0000 [IU] | Freq: Three times a day (TID) | SUBCUTANEOUS | Status: DC
  Administered 2019-07-21: 4 [IU] via SUBCUTANEOUS

## 2019-07-21 MED ORDER — JUVEN PO PACK
1.0000 | PACK | Freq: Two times a day (BID) | ORAL | Status: DC
  Administered 2019-07-21 – 2019-07-25 (×5): 1 via ORAL
  Filled 2019-07-21 (×9): qty 1

## 2019-07-21 NOTE — Progress Notes (Addendum)
Initial Nutrition Assessment  DOCUMENTATION CODES:   Obesity unspecified  INTERVENTION:   -Juven Fruit Punch BID, each serving provides 95kcal and 2.5g of protein (amino acids glutamine and arginine)   NUTRITION DIAGNOSIS:   Increased nutrient needs related to wound healing as evidenced by estimated needs.  GOAL:   Patient will meet greater than or equal to 90% of their needs  MONITOR:   PO intake, Supplement acceptance, Labs, Weight trends, Skin, I & O's  REASON FOR ASSESSMENT:   Consult Wound healing  ASSESSMENT:   53 y/o female with history of type 2 diabetes mellitus, Charcot deformity of right foot status post fifth metatarsal resection recently, hypothyroidism, COPD, chronic pain and hyperlipidemia presented with worsened wound over the right foot metatarsal resection that she had about 2 weeks back.  She was sent home on wound VAC and Augmentin but about 6 days back the wound VAC stopped working.  She was seen in the office by her podiatrist on the day of admission and recommended her to be admitted to the hospital for IV antibiotics and surgical intervention.   9/4: s/p  Right 5th metatarsal metatarsectomy, Wound Vac 9/19: s/p Incision and drainage right foot wound below the deep fascia  **RD working remotely**  Patient was eating well during previous admission following her metatarsal resection. Pt reports eating adequately since. Pt would benefit from nutritional supplements to aid in wound healing, will order Juven supplements.  Per weight records, weights have been stable. I/Os: +799 ml since admit  Medications reviewed. Labs reviewed: CBGs: [id number]  NUTRITION - FOCUSED PHYSICAL EXAM:  Unable to perform- working remotely.  Diet Order:   Diet Order            Diet Carb Modified Fluid consistency: Thin; Room service appropriate? Yes  Diet effective now              EDUCATION NEEDS:   No education needs have been identified at this time  Skin:   Skin Assessment: Skin Integrity Issues: Skin Integrity Issues:: Diabetic Ulcer Diabetic Ulcer: left foot  Last BM:  9/19  Height:   Ht Readings from Last 1 Encounters:  07/19/19 5\' 9"  (1.753 m)    Weight:   Wt Readings from Last 1 Encounters:  07/19/19 101.8 kg    Ideal Body Weight:  65.9 kg  BMI:  Body mass index is 33.14 kg/m.  Estimated Nutritional Needs:   Kcal:  1850-2050  Protein:  80-90g  Fluid:  2L/day  Clayton Bibles, MS, RD, LDN Inpatient Clinical Dietitian Pager: [phone] After Hours Pager: [phone]

## 2019-07-21 NOTE — Progress Notes (Signed)
This shift pt informed this RN that  She does not want Tramadol for pain medication. It does nothing for her. Pt advised that PO Dilaudid not due yet. Will continue to monitor

## 2019-07-21 NOTE — Evaluation (Signed)
Physical Therapy Evaluation Patient Details Name: Katherine Herrera MRN: [id number] DOB: [birth year] Today's Date: 07/21/2019   History of Present Illness  53 y/o female with history of type 2 diabetes mellitus, Charcot deformity of right foot status post fifth metatarsal resection recently, hypothyroidism, COPD, chronic pain and hyperlipidemia presented with worsened wound over the right foot metatarsal resection that she had about 2 weeks back.  She was sent home on wound VAC and Augmentin but about 6 days back the wound VAC stopped working.  She was seen in the office by her podiatrist on the day of admission and recommended her to be admitted to the hospital for IV antibiotics and surgical intervention. Pt is now s/p Incision and drainage right foot wound below the deep fascia on 07/20/19.  Clinical Impression  Pt admitted as above and presenting with functional mobility limitations 2* R foot pain with limited WB tolerance, ltd endurance and ambulatory balance deficits.  Pt should progress to dc home with assist of family/friends.    Follow Up Recommendations No PT follow up    Equipment Recommendations  None recommended by PT    Recommendations for Other Services       Precautions / Restrictions Precautions Precautions: Fall Precaution Comments: CAM boot on pt upon OT arrival Required Braces or Orthoses: (PT note from last admission 07/06/19 says CAM boot at all time) Restrictions Weight Bearing Restrictions: No RLE Weight Bearing: Weight bearing as tolerated Other Position/Activity Restrictions: WBAT per RN and pt      Mobility  Bed Mobility Overal bed mobility: Modified Independent Bed Mobility: Supine to Sit;Sit to Supine     Supine to sit: HOB elevated;Modified independent (Device/Increase time) Sit to supine: HOB elevated;Modified independent (Device/Increase time)   General bed mobility comments: No assist required  Transfers Overall transfer level: Needs  assistance Equipment used: Rolling walker (2 wheeled) Transfers: Sit to/from Stand Sit to Stand: Min guard   Squat pivot transfers: Supervision     General transfer comment: cues for use of UEs to self assist and steady assist  Ambulation/Gait Ambulation/Gait assistance: Min guard Gait Distance (Feet): 80 Feet Assistive device: Rolling walker (2 wheeled) Gait Pattern/deviations: Shuffle;Decreased stride length;Wide base of support;Step-to pattern Gait velocity:  reduced   General Gait Details: cues for sequence, posture and position from RW; distance pain ltd, Shoe on L foot to equalize LE length to cam boot  Stairs            Wheelchair Mobility    Modified Rankin (Stroke Patients Only)       Balance Overall balance assessment: Needs assistance Sitting-balance support: Feet supported;No upper extremity supported Sitting balance-Leahy Scale: Good     Standing balance support: Bilateral upper extremity supported;During functional activity Standing balance-Leahy Scale: Fair Standing balance comment: increased pain with WB RLE.                              Pertinent Vitals/Pain Pain Assessment: 0-10 Pain Score: 7  Faces Pain Scale: Hurts even more Pain Location: R foot Pain Descriptors / Indicators: Grimacing;Operative site guarding Pain Intervention(s): Limited activity within patient's tolerance;Monitored during session;RN gave pain meds during session;Premedicated before session    [place] expects to be discharged to:: Private residence Living Arrangements: Alone Available Help at Discharge: Family;Friend(s);Available PRN/intermittently Type of Home: Apartment Home Access: Ramped entrance     Home Layout: One level Home Equipment: Bedside commode;Walker - 4 wheels  Prior Function Level of Independence: Independent with assistive device(s)         Comments: has been able to manage with previous equipment      Hand Dominance   Dominant Hand: Right    Extremity/Trunk Assessment   Upper Extremity Assessment Upper Extremity Assessment: Overall WFL for tasks assessed    Lower Extremity Assessment Lower Extremity Assessment: Overall WFL for tasks assessed;RLE deficits/detail RLE Deficits / Details: Cam boot in place       Communication   Communication: No difficulties  Cognition Arousal/Alertness: Awake/alert Behavior During Therapy: WFL for tasks assessed/performed Overall Cognitive Status: Within Functional Limits for tasks assessed                                 General Comments: pt is in cam boot R foot with minimal pain until walking      General Comments      Exercises     Assessment/Plan    PT Assessment Patient needs continued PT services  PT Problem List Decreased strength;Decreased range of motion;Decreased activity tolerance;Decreased balance;Decreased mobility;Decreased coordination;Decreased knowledge of use of DME;Decreased safety awareness;Decreased knowledge of precautions;Decreased skin integrity;Pain       PT Treatment Interventions DME instruction;Gait training;Functional mobility training;Therapeutic activities;Therapeutic exercise;Balance training;Neuromuscular re-education;Patient/family education    PT Goals (Current goals can be found in the Care Plan section)  Acute Rehab PT Goals Patient Stated Goal: to walk and to have less pain PT Goal Formulation: With patient Time For Goal Achievement: 08/03/19 Potential to Achieve Goals: Good    Frequency Min 3X/week   Barriers to discharge        Co-evaluation               AM-PAC PT "6 Clicks" Mobility  Outcome Measure Help needed turning from your back to your side while in a flat bed without using bedrails?: None Help needed moving from lying on your back to sitting on the side of a flat bed without using bedrails?: None Help needed moving to and from a bed to a chair  (including a wheelchair)?: A Little Help needed standing up from a chair using your arms (e.g., wheelchair or bedside chair)?: A Little Help needed to walk in hospital room?: A Little Help needed climbing 3-5 steps with a railing? : A Little 6 Click Score: 20    End of Session Equipment Utilized During Treatment: Gait belt Activity Tolerance: Patient tolerated treatment well Patient left: in bed;with call bell/phone within reach;with nursing/sitter in room;with family/visitor present Nurse Communication: Mobility status PT Visit Diagnosis: Unsteadiness on feet (R26.81);Muscle weakness (generalized) (M62.81);Pain Pain - Right/Left: Right Pain - part of body: Ankle and joints of foot    Time: [id number] PT Time Calculation (min) (ACUTE ONLY): 20 min   Charges:   PT Evaluation $PT Eval Low Complexity: 1 Low          [place] Pager [phone] Office [phone]   Jobe Mutch 07/21/2019, 4:40 PM

## 2019-07-21 NOTE — Progress Notes (Signed)
OT Cancellation Note  Patient Details Name: Katherine Herrera MRN: [id number] DOB: [birth year]   Cancelled Treatment:    Reason Eval/Treat Not Completed: Pain limiting ability to participate. Pt reporting high level of pain in RLE. RN giving oral pain med to pt upon OT arrival. Plan to give that some time to take effect and then reattempt.  Tyrone Schimke, OT Acute Rehabilitation Services Pager: [phone] Office: [phone]  07/21/2019, 9:04 AM

## 2019-07-21 NOTE — Progress Notes (Addendum)
PROGRESS NOTE                                                                                                                                                                                                             Patient Demographics:    Katherine Herrera, is a 53 y.o. female, DOB - Dec 06, 1965, [id number]  Admit date - 07/19/2019   Admitting Physician Toy Baker, MD  Outpatient Primary MD for the patient is Plotnikov, [name], MD  LOS - 2  Outpatient Specialists: Podiatry  No chief complaint on file.      Brief Narrative 53 y/o female with history of type 2 diabetes mellitus, Charcot deformity of right foot status post fifth metatarsal resection recently, hypothyroidism, COPD, chronic pain and hyperlipidemia presented with worsened wound over the right foot metatarsal resection that she had about 2 weeks back.  She was sent home on wound VAC and Augmentin but about 6 days back the wound VAC stopped working.  She was seen in the office by her podiatrist on the day of admission and recommended her to be admitted to the hospital for IV antibiotics and surgical intervention. Patient denies any fevers, chills or nausea.  Reports that her CBG has been running in the 50s to 60s last few days despite adequate meal intake.    Subjective:   Underwent I&D of the right foot yesterday.  Complains of severe pain on the foot.  Afebrile.   Assessment  & Plan :   Principal problem Right diabetic foot ulcer, with poor healing On empiric IV vancomycin and Zosyn.    Underwent I&D of the right foot by podiatry on 9/19. Pain control with PRN tramadol and home dose Dilaudid.  Added PRN Toradol given uncontrolled pain. Blood and wound culture pending.  Type 2 diabetes mellitus, uncontrolled with hyperglycemia A1c of 7.8.  CBGs in the range of 250-400.  Will increase Lantus to 35 units and add pre-meal aspart 4 units 3 times  daily.  Hypothyroidism Continue Synthroid  Diabetic polyneuropathy Continue Neurontin  Tobacco abuse Counseled on cessation  Chronic pain Home meds continued   Code Status : Full code  Family Communication  : None  Disposition Plan  : Home once clinically improved  Barriers For Discharge : Active symptoms  Consults  : Podiatry  Procedures  : None  DVT  Prophylaxis  :  Lovenox -   Lab Results  Component Value Date   PLT 448 (H) 07/20/2019    Antibiotics  :    Anti-infectives (From admission, onward)   Start     Dose/Rate Route Frequency Ordered Stop   07/21/19 0300  vancomycin (VANCOCIN) 1,500 mg in sodium chloride 0.9 % 500 mL IVPB     1,500 mg 250 mL/hr over 120 Minutes Intravenous Daily 07/20/19 0120     07/20/19 0900  piperacillin-tazobactam (ZOSYN) IVPB 3.375 g     3.375 g 12.5 mL/hr over 240 Minutes Intravenous Every 8 hours 07/20/19 0120     07/19/19 2230  vancomycin (VANCOCIN) 2,000 mg in sodium chloride 0.9 % 500 mL IVPB     2,000 mg 250 mL/hr over 120 Minutes Intravenous  Once 07/19/19 2200 07/20/19 0243   07/19/19 2200  piperacillin-tazobactam (ZOSYN) IVPB 3.375 g     3.375 g 100 mL/hr over 30 Minutes Intravenous NOW 07/19/19 2200 07/20/19 0317        Objective:   Vitals:   07/20/19 2023 07/21/19 0012 07/21/19 0407 07/21/19 1006  BP: 121/73 107/77 108/62 107/64  Pulse: 65 62 67 64  Resp: 19 20 19 16   Temp: 98.6 F (37 C) 98.6 F (37 C) (!) 97.5 F (36.4 C) 98.2 F (36.8 C)  TempSrc: Oral   Oral  SpO2: 92% 92% 92% 90%  Weight:      Height:        Wt Readings from Last 3 Encounters:  07/19/19 101.8 kg  07/05/19 103.8 kg  07/01/19 103.9 kg     Intake/Output Summary (Last 24 hours) at 07/21/2019 1320 Last data filed at 07/21/2019 [id number] Gross per 24 hour  Intake 242.13 ml  Output 30 ml  Net 212.13 ml    Physical exam Distress with pain HEENT: Moist mucosa, supple neck Chest: Clear CVs: Normal S1-S2 GI: Soft, nondistended,  nontender Musculoskeletal: Dressing over the right foot, with severe tenderness over the foot up to the tibia.  No edema      Data Review:    CBC Recent Labs  Lab 07/19/19 2320 07/20/19 0451  WBC 11.4* 9.9  HGB 13.6 12.1  HCT 43.1 38.7  PLT 492* 448*  MCV 88.3 88.2  MCH 27.9 27.6  MCHC 31.6 31.3  RDW 13.4 13.3  LYMPHSABS 3.9  --   MONOABS 0.7  --   EOSABS 0.4  --   BASOSABS 0.1  --     Chemistries  Recent Labs  Lab 07/19/19 2320 07/20/19 0451 07/21/19 0518  NA 139 138  --   K 4.2 4.5  --   CL 98 102  --   CO2 29 26  --   GLUCOSE 260* 164*  --   BUN 19 23*  --   CREATININE 1.16* 1.11* 1.18*  CALCIUM 9.1 8.8*  --   MG  --  2.1  --   AST 13* 11*  --   ALT 11 10  --   ALKPHOS 112 95  --   BILITOT 0.2* 0.4  --    ------------------------------------------------------------------------------------------------------------------ No results for input(s): CHOL, HDL, LDLCALC, TRIG, CHOLHDL, LDLDIRECT in the last 72 hours.  Lab Results  Component Value Date   HGBA1C 7.8 (H) 07/19/2019   ------------------------------------------------------------------------------------------------------------------ Recent Labs    07/20/19 0451  TSH 1.575   ------------------------------------------------------------------------------------------------------------------ No results for input(s): VITAMINB12, FOLATE, FERRITIN, TIBC, IRON, RETICCTPCT in the last 72 hours.  Coagulation profile Recent Labs  Lab 07/19/19  2320  INR 1.0    No results for input(s): DDIMER in the last 72 hours.  Cardiac Enzymes No results for input(s): CKMB, TROPONINI, MYOGLOBIN in the last 168 hours.  Invalid input(s): CK ------------------------------------------------------------------------------------------------------------------    Component Value Date/Time   BNP 58.0 09/28/2018 2112    Inpatient Medications  Scheduled Meds:  baclofen  20 mg Oral BID   ezetimibe  10 mg Oral  Daily   gabapentin  800 mg Oral QID   insulin aspart  0-15 Units Subcutaneous TID WC   insulin glargine  20 Units Subcutaneous QHS   lamoTRIgine  25 mg Oral BID   levothyroxine  75 mcg Oral Daily   rOPINIRole  0.5 mg Oral QHS   senna  1 tablet Oral BID   Continuous Infusions:  sodium chloride 250 mL (07/20/19 0042)   piperacillin-tazobactam (ZOSYN)  IV 3.375 g (07/21/19 0535)   vancomycin 1,500 mg (07/21/19 0232)   PRN Meds:.sodium chloride, acetaminophen **OR** acetaminophen, clonazePAM, HYDROmorphone, ketorolac, loratadine, ondansetron **OR** ondansetron (ZOFRAN) IV, polyethylene glycol, sodium chloride flush, sodium chloride flush, traMADol  Micro Results Recent Results (from the past 240 hour(s))  SARS CORONAVIRUS 2 (TAT 6-24 HRS) Nasopharyngeal Nasopharyngeal Swab     Status: None   Collection Time: 07/19/19  9:43 PM   Specimen: Nasopharyngeal Swab  Result Value Ref Range Status   SARS Coronavirus 2 NEGATIVE NEGATIVE Final    Comment: (NOTE) SARS-CoV-2 target nucleic acids are NOT DETECTED. The SARS-CoV-2 RNA is generally detectable in upper and lower respiratory specimens during the acute phase of infection. Negative results do not preclude SARS-CoV-2 infection, do not rule out co-infections with other pathogens, and should not be used as the sole basis for treatment or other patient management decisions. Negative results must be combined with clinical observations, patient history, and epidemiological information. The expected result is Negative. Fact Sheet for Patients: SugarRoll.be Fact Sheet for Healthcare Providers: https://www.woods-mathews.com/ This test is not yet approved or cleared by the [place] FDA and  has been authorized for detection and/or diagnosis of SARS-CoV-2 by FDA under an Emergency Use Authorization (EUA). This EUA will remain  in effect (meaning this test can be used) for the duration of  the COVID-19 declaration under Section 56 4(b)(1) of the Act, 21 U.S.C. section 360bbb-3(b)(1), unless the authorization is terminated or revoked sooner. Performed at [facility], [place] [address] [place], [place] [address]   MRSA PCR Screening     Status: None   Collection Time: 07/19/19  9:54 PM   Specimen: Nasal Mucosa; Nasopharyngeal  Result Value Ref Range Status   MRSA by PCR NEGATIVE NEGATIVE Final    Comment:        The GeneXpert MRSA Assay (FDA approved for NASAL specimens only), is one component of a comprehensive MRSA colonization surveillance program. It is not intended to diagnose MRSA infection nor to guide or monitor treatment for MRSA infections. Performed at [facility], [place] [address] [place], [place] [address]   Blood Cultures x 2 sites     Status: None (Preliminary result)   Collection Time: 07/19/19 11:20 PM   Specimen: BLOOD  Result Value Ref Range Status   Specimen Description   Final    BLOOD BLOOD RIGHT FOREARM Performed at [facility] [address], [place], [place] [address]    Special Requests   Final    BOTTLES DRAWN AEROBIC ONLY Blood Culture results may not be optimal due to an excessive volume  of blood received in culture bottles Performed at [facility], [place] [address] [place], [place] [address]    Culture PENDING  Incomplete   Report Status PENDING  Incomplete  Blood Cultures x 2 sites     Status: None (Preliminary result)   Collection Time: 07/19/19 11:20 PM   Specimen: BLOOD  Result Value Ref Range Status   Specimen Description   Final    BLOOD RIGHT ANTECUBITAL Performed at [facility] [address], [place], [place] [address]    Special Requests   Final    BOTTLES DRAWN AEROBIC ONLY Blood Culture results may not be optimal due to an excessive volume of blood received in culture bottles Performed at [facility] [address] [place],  [place] [address]    Culture PENDING  Incomplete   Report Status PENDING  Incomplete  Aerobic/Anaerobic Culture (surgical/deep wound)     Status: None (Preliminary result)   Collection Time: 07/20/19  2:40 PM   Specimen: PATH Other; Tissue  Result Value Ref Range Status   Specimen Description   Final    WOUND RIGHT FOOT Performed at [facility], [place] [address], [place], [place] [address]    Special Requests   Final    NONE Performed at [facility], [place] [address] [place], [place] [address]    Gram Stain   Final    RARE WBC PRESENT, PREDOMINANTLY PMN NO ORGANISMS SEEN    Culture   Final    NO GROWTH < 24 HOURS Performed at [place] [address] [place], [place] [address]    Report Status PENDING  Incomplete    Radiology Reports Mr Foot Right Wo Contrast  Result Date: 07/05/2019 CLINICAL DATA:  Diabetic with right foot ulcer and cellulitis. Limited response to antibiotics. Clinical concern of osteomyelitis. EXAM: MRI OF THE RIGHT FOREFOOT WITHOUT CONTRAST TECHNIQUE: Multiplanar, multisequence MR imaging of the right forefoot was performed. No intravenous contrast was administered. COMPARISON:  Radiographs 06/20/2019, 04/02/2019 and 01/02/2019. FINDINGS: Bones/Joint/Cartilage Patient underwent resection of the 5th metatarsal head on 01/02/2019. The amputation site appears sharp. There is periosteal thickening involving the base of the 5th metatarsal which appears chronic. Low-level marrow T2 hyperintensity is present within the base of the 5th metatarsal without corresponding T1 signal abnormality or cortical destruction. There are overlying soft tissue changes with inflammation and possible soft tissue emphysema in this area. There is mild T2 hyperintensity laterally in the cuboid without corresponding T1 signal abnormality or cortical destruction. Apparent arthropathy at the 2nd DIP joint. No other focal osseous lesions are identified. There are mild  degenerative changes in the midfoot. Ligaments Intact Lisfranc ligament. Muscles and Tendons Diffuse forefoot muscular atrophy without focal fluid collection. No significant tenosynovitis. Soft tissues There is skin irregularity and ulceration along the plantar and lateral aspect of the 5th metatarsal base. There are inflammatory changes and possible soft tissue emphysema in the subcutaneous fat. There is no well-defined fluid collection. As noted above, the soft tissue changes are adjacent to marrow changes in the base of the 5th metatarsal. IMPRESSION: 1. Soft tissue ulceration along the plantar and lateral aspect of the 5th metatarsal base with underlying inflammatory changes and possible soft tissue emphysema. These findings are in close proximity to T2 marrow changes in the base of the 5th metatarsal, suspicious for early osteomyelitis. There is no cortical destruction. 2. 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 3. Apparent arthropathy at the  2nd DIP joint. Electronically Signed   By: Richardean Sale M.D.   On: 07/05/2019 12:46   Dg Foot 2 Views Right  Result Date: 07/05/2019 CLINICAL DATA:  Status post right fifth metatarsal resection, peroneal tenodesis, ulcer repair EXAM: RIGHT FOOT - 2 VIEW COMPARISON:  06/24/2019 FINDINGS: Redemonstrated postoperative findings of distal right fifth metatarsal amputation with new overlying skin staples about the base of the right fifth metatarsal. There is soft tissue edema about the midfoot and ankle. Mild right first metatarsophalangeal arthrosis. IMPRESSION: Redemonstrated postoperative findings of distal right fifth metatarsal amputation with new overlying skin staples about the base of the right fifth metatarsal. There is soft tissue edema about the midfoot and ankle. Electronically Signed   By: Eddie Candle M.D.   On: 07/05/2019 16:45   Dg Foot Complete Right  Result Date: 07/01/2019 Please see detailed radiograph report in office note.  Vas [place] Burnard Bunting  With/wo Tbi  Result Date: 07/21/2019 LOWER EXTREMITY DOPPLER STUDY Indications: Ulceration. High Risk Factors: Diabetes. Other Factors: Post op foot infection worsening even with antibiotics. Admitted                for IV antibiotics and I&D of foot.  Vascular Interventions: 07/05/19 right 5th metatarsal resection. Comparison Study: 09/24/12 WNL Performing Technologist: June Leap Rdms, Rvt  Examination Guidelines: A complete evaluation includes at minimum, Doppler waveform signals and systolic blood pressure reading at the level of bilateral brachial, anterior tibial, and posterior tibial arteries, when vessel segments are accessible. Bilateral testing is considered an integral part of a complete examination. Photoelectric Plethysmograph (PPG) waveforms and toe systolic pressure readings are included as required and additional duplex testing as needed. Limited examinations for reoccurring indications may be performed as noted.  ABI Findings: +---------+------------------+-----+---------+-------------------------+  Right     Rt Pressure (mmHg) Index Waveform  Comment                    +---------+------------------+-----+---------+-------------------------+  Brachial                           triphasic BP not obtained due to IV  +---------+------------------+-----+---------+-------------------------+  ATA       148                1.33  biphasic                             +---------+------------------+-----+---------+-------------------------+  PTA       142                1.28  triphasic                            +---------+------------------+-----+---------+-------------------------+  Great Toe 142                1.28  Abnormal                             +---------+------------------+-----+---------+-------------------------+ +---------+------------------+-----+---------+-------+  Left      Lt Pressure (mmHg) Index Waveform  Comment  +---------+------------------+-----+---------+-------+  Brachial  111                       triphasic          +---------+------------------+-----+---------+-------+  ATA       146  1.32  triphasic          +---------+------------------+-----+---------+-------+  PTA       155                1.40  biphasic           +---------+------------------+-----+---------+-------+  Great Toe 141                1.27  Abnormal           +---------+------------------+-----+---------+-------+  Summary: Right: Resting right ankle-brachial index indicates noncompressible right lower extremity arteries. The right toe-brachial index is normal. Left: Resting left ankle-brachial index indicates noncompressible left lower extremity arteries. The left toe-brachial index is normal.  *See table(s) above for measurements and observations.  Electronically signed by Ruta Hinds MD on 07/21/2019 at 11:00:34 AM.    Final    Vas [place] Lower Extremity Venous (dvt)  Result Date: 07/08/2019  Lower Venous Study Indications: Pain. Other Indications: Diabetic ulcer with necrosis of bone, osteomyelitis. Limitations: Body habitus, edema, poor ultrasound/tissue interface and orthopaedic appliance. Comparison Study: Prior negative left lower extremity venous duplex from 05/12/17                   is available for comparison. Performing Technologist: Sharion Dove RVS  Examination Guidelines: A complete evaluation includes B-mode imaging, spectral Doppler, color Doppler, and power Doppler as needed of all accessible portions of each vessel. Bilateral testing is considered an integral part of a complete examination. Limited examinations for reoccurring indications may be performed as noted.  +---------+---------------+---------+-----------+----------+------------------+  RIGHT     Compressibility Phasicity Spontaneity Properties Thrombus Aging      +---------+---------------+---------+-----------+----------+------------------+  CFV       Full            Yes       Yes                                         +---------+---------------+---------+-----------+----------+------------------+  SFJ       Full                                                                 +---------+---------------+---------+-----------+----------+------------------+  FV Prox   Full                                                                 +---------+---------------+---------+-----------+----------+------------------+  FV Mid    Full                                                                 +---------+---------------+---------+-----------+----------+------------------+  FV Distal Full                                                                 +---------+---------------+---------+-----------+----------+------------------+  PFV       Full                                                                 +---------+---------------+---------+-----------+----------+------------------+  POP                       Yes       Yes                    Patent by color                                                                 Doppler             +---------+---------------+---------+-----------+----------+------------------+  PTV                                                        Not visualized      +---------+---------------+---------+-----------+----------+------------------+  PERO                                                       Not visualized      +---------+---------------+---------+-----------+----------+------------------+   +----+---------------+---------+-----------+----------+--------------+  LEFT Compressibility Phasicity Spontaneity Properties Thrombus Aging  +----+---------------+---------+-----------+----------+--------------+  CFV  Full            Yes       Yes                                    +----+---------------+---------+-----------+----------+--------------+     Summary: Right: There is no evidence of deep vein thrombosis in the lower extremity. However, portions of this examination were limited- see  technologist comments above. Left: No evidence of common femoral vein obstruction.  *See table(s) above for measurements and observations. Electronically signed by Harold Barban MD on 07/08/2019 at 1:11:38 AM.    Final     Time Spent in minutes 35   Indie Boehne M.D on 07/21/2019 at 1:20 PM  Between 7am to 7pm - Pager - [phone]  After 7pm go to [email or web address] - password [facility]  [facility] -  Office  [phone]

## 2019-07-21 NOTE — Evaluation (Signed)
Occupational Therapy Evaluation Patient Details Name: Katherine Herrera MRN: [id number] DOB: 1966-04-05 Today's Date: 07/21/2019    History of Present Illness 53 y/o female with history of type 2 diabetes mellitus, Charcot deformity of right foot status post fifth metatarsal resection recently, hypothyroidism, COPD, chronic pain and hyperlipidemia presented with worsened wound over the right foot metatarsal resection that she had about 2 weeks back.  She was sent home on wound VAC and Augmentin but about 6 days back the wound VAC stopped working.  She was seen in the office by her podiatrist on the day of admission and recommended her to be admitted to the hospital for IV antibiotics and surgical intervention. Pt is now s/p Incision and drainage right foot wound below the deep fascia on 07/20/19.   Clinical Impression   Pt admitted with the above diagnoses and presents with below problem list. Pt will benefit from continued acute OT to address the below listed deficits and maximize independence with basic ADLs prior to d/c home. At baseline, pt is mod I with ADLs. Pt is currently setup to min guard with ADLs and functional transfers/mobility. Of note, pt is discharging to a different home setting than last admission earlier this month. Pt plans to d/c to apartment, alone, with PRN assist from daughter and friend(s).     Follow Up Recommendations  Home health OT;Supervision - Intermittent(OOB/mobility)    Equipment Recommendations  None recommended by OT    Recommendations for Other Services PT consult     Precautions / Restrictions Precautions Precautions: Fall Precaution Comments: CAM boot on pt upon OT arrival Required Braces or Orthoses: (PT note from last admission 07/06/19 says CAM boot at all time) Restrictions RLE Weight Bearing: Weight bearing as tolerated Other Position/Activity Restrictions: WBAT per RN      Mobility Bed Mobility Overal bed mobility: Needs Assistance Bed  Mobility: Supine to Sit     Supine to sit: Supervision     General bed mobility comments: supervision for safety  Transfers Overall transfer level: Needs assistance Equipment used: Rolling walker (2 wheeled) Transfers: Sit to/from [employer] Sit to Stand: Min guard   Squat pivot transfers: Supervision     General transfer comment: Pt did squat-pivot to [facility] at start of session which is her preferred way to get to [facility] due to baseline increased urinary frequency. Pt also completed sit<>stand from [facility] and to recliner utilizing rw. Min guard for safety    Balance Overall balance assessment: Needs assistance Sitting-balance support: Feet supported;No upper extremity supported Sitting balance-Leahy Scale: Good     Standing balance support: Bilateral upper extremity supported;During functional activity Standing balance-Leahy Scale: Fair Standing balance comment: increased pain with WB RLE.                            ADL either performed or assessed with clinical judgement   ADL Overall ADL's : Needs assistance/impaired Eating/Feeding: Set up;Sitting   Grooming: Set up;Sitting   Upper Body Bathing: Set up;Sitting   Lower Body Bathing: Min guard;Sit to/from stand   Upper Body Dressing : Set up;Sitting   Lower Body Dressing: Min guard;Sit to/from stand   Toilet Transfer: Min Contractor Details (indicate cue type and reason): Pt has been transferring EOB to [facility] today as squat-pivot. Plans to do it that way at home per her report. Pt reports she has increased urinary frequency at baseline.  Toileting- Clothing Manipulation and Hygiene: Set up;Min  guard;Sit to/from stand;Sitting/lateral lean   Tub/ Shower Transfer: Nurse, learning disability Details (indicate cue type and reason): sponge bathing currently  Functional mobility during ADLs: Min guard;Rolling walker General ADL Comments: Pt completed bed mobility, pivot to  BSC, pericare, then ambulate around foot of bed to sit up in recliner for a bit. Min guard for safety     Vision         Perception     Praxis      Pertinent Vitals/Pain Pain Assessment: Faces Faces Pain Scale: Hurts even more Pain Location: R foot during in room ambulation Pain Descriptors / Indicators: Grimacing;Operative site guarding Pain Intervention(s): Limited activity within patient's tolerance;Monitored during session;Premedicated before session;Repositioned;Other (comment)(elevated)     Hand Dominance Right   Extremity/Trunk Assessment Upper Extremity Assessment Upper Extremity Assessment: Overall WFL for tasks assessed;Generalized weakness   Lower Extremity Assessment Lower Extremity Assessment: Defer to PT evaluation       Communication Communication Communication: No difficulties   Cognition Arousal/Alertness: Awake/alert Behavior During Therapy: WFL for tasks assessed/performed Overall Cognitive Status: Within Functional Limits for tasks assessed                                     General Comments       Exercises     Shoulder Instructions      Home Living Family/patient expects to be discharged to:: Private residence Living Arrangements: Alone Available Help at Discharge: Family;Friend(s);Available PRN/intermittently Type of Home: Apartment       Home Layout: One level     Bathroom Shower/Tub: Teacher, early years/pre: Standard     Home Equipment: Bedside commode;Walker - 4 wheels          Prior Functioning/Environment Level of Independence: Independent with assistive device(s)                 OT Problem List: Decreased activity tolerance;Impaired balance (sitting and/or standing);Decreased knowledge of use of DME or AE;Decreased knowledge of precautions;Pain      OT Treatment/Interventions: Self-care/ADL training;DME and/or AE instruction;Therapeutic activities;Patient/family education;Balance  training    OT Goals(Current goals can be found in the care plan section) Acute Rehab OT Goals Patient Stated Goal: to walk and to have less pain OT Goal Formulation: With patient Time For Goal Achievement: 08/04/19 Potential to Achieve Goals: Good ADL Goals Pt Will Perform Grooming: with modified independence;sitting;standing Pt Will Perform Lower Body Bathing: with modified independence;sit to/from stand Pt Will Perform Lower Body Dressing: with modified independence;sit to/from stand Pt Will Transfer to Toilet: with modified independence;ambulating Pt Will Perform Toileting - Clothing Manipulation and hygiene: with modified independence;sit to/from stand  OT Frequency: Min 2X/week   Barriers to D/C:    PRN assist, could have overnight assist if needed. Discussed timing certain ADLs with availability of assistance such as sponge bathing       Co-evaluation              AM-PAC OT "6 Clicks" Daily Activity     Outcome Measure Help from another person eating meals?: None Help from another person taking care of personal grooming?: None Help from another person toileting, which includes using toliet, bedpan, or urinal?: A Little Help from another person bathing (including washing, rinsing, drying)?: A Little Help from another person to put on and taking off regular upper body clothing?: None Help from another person to put on and taking off  regular lower body clothing?: A Little 6 Click Score: 21   End of Session Equipment Utilized During Treatment: Rolling walker;Other (comment)(R CAM boot on throughout session) Nurse Communication: Mobility status  Activity Tolerance: Patient tolerated treatment well;Patient limited by pain Patient left: in chair;with call bell/phone within reach  OT Visit Diagnosis: Unsteadiness on feet (R26.81);Pain Pain - Right/Left: Right Pain - part of body: Ankle and joints of foot                Time: 1325-1340 OT Time Calculation (min): 15  min Charges:  OT General Charges $OT Visit: 1 Visit OT Evaluation $OT Eval Low Complexity: [place], OT Acute Rehabilitation Services Pager: [phone] Office: [phone]   Hortencia Pilar 07/21/2019, 2:10 PM

## 2019-07-22 ENCOUNTER — Inpatient Hospital Stay (HOSPITAL_COMMUNITY): Payer: Medicare Other | Admitting: Certified Registered Nurse Anesthetist

## 2019-07-22 ENCOUNTER — Encounter (HOSPITAL_COMMUNITY)
Admission: AD | Disposition: A | Discharge status: Home / Self Care | Payer: Self-pay | Source: Ambulatory Visit | Attending: Internal Medicine

## 2019-07-22 ENCOUNTER — Encounter (HOSPITAL_COMMUNITY): Payer: Self-pay | Admitting: General Practice

## 2019-07-22 HISTORY — PX: INCISION AND DRAINAGE OF WOUND: SHX1803

## 2019-07-22 LAB — BASIC METABOLIC PANEL
Anion gap: 9 (ref 5–15)
BUN: 28 mg/dL — ABNORMAL HIGH (ref 6–20)
CO2: 28 mmol/L (ref 22–32)
Calcium: 8.6 mg/dL — ABNORMAL LOW (ref 8.9–10.3)
Chloride: 97 mmol/L — ABNORMAL LOW (ref 98–111)
Creatinine, Ser: 1.39 mg/dL — ABNORMAL HIGH (ref 0.44–1.00)
GFR calc Af Amer: 50 mL/min — ABNORMAL LOW (ref >60)
GFR calc non Af Amer: 43 mL/min — ABNORMAL LOW (ref >60)
Glucose, Bld: 384 mg/dL — ABNORMAL HIGH (ref 70–99)
Potassium: 4.6 mmol/L (ref 3.5–5.1)
Sodium: 134 mmol/L — ABNORMAL LOW (ref 135–145)

## 2019-07-22 LAB — GLUCOSE, CAPILLARY
Glucose-Capillary: 125 mg/dL — ABNORMAL HIGH (ref 70–99)
Glucose-Capillary: 150 mg/dL — ABNORMAL HIGH (ref 70–99)
Glucose-Capillary: 168 mg/dL — ABNORMAL HIGH (ref 70–99)
Glucose-Capillary: 264 mg/dL — ABNORMAL HIGH (ref 70–99)
Glucose-Capillary: 373 mg/dL — ABNORMAL HIGH (ref 70–99)
Glucose-Capillary: 376 mg/dL — ABNORMAL HIGH (ref 70–99)
Glucose-Capillary: 402 mg/dL — ABNORMAL HIGH (ref 70–99)
Glucose-Capillary: 467 mg/dL — ABNORMAL HIGH (ref 70–99)

## 2019-07-22 LAB — CBC
HCT: 37.5 % (ref 36.0–46.0)
Hemoglobin: 11.7 g/dL — ABNORMAL LOW (ref 12.0–15.0)
MCH: 27.5 pg (ref 26.0–34.0)
MCHC: 31.2 g/dL (ref 30.0–36.0)
MCV: 88.2 fL (ref 80.0–100.0)
Platelets: 367 10*3/uL (ref 150–400)
RBC: 4.25 MIL/uL (ref 3.87–5.11)
RDW: 12.7 % (ref 11.5–15.5)
WBC: 7.7 10*3/uL (ref 4.0–10.5)
nRBC: 0 % (ref 0.0–0.2)

## 2019-07-22 SURGERY — IRRIGATION AND DEBRIDEMENT WOUND
Anesthesia: General | Site: Foot | Laterality: Right

## 2019-07-22 MED ORDER — INSULIN ASPART 100 UNIT/ML ~~LOC~~ SOLN
8.0000 [IU] | Freq: Three times a day (TID) | SUBCUTANEOUS | Status: DC
  Administered 2019-07-22 – 2019-07-23 (×3): 8 [IU] via SUBCUTANEOUS

## 2019-07-22 MED ORDER — EPHEDRINE SULFATE-NACL 50-0.9 MG/10ML-% IV SOSY
PREFILLED_SYRINGE | INTRAVENOUS | Status: DC | PRN
  Administered 2019-07-22 (×3): 10 mg via INTRAVENOUS

## 2019-07-22 MED ORDER — VANCOMYCIN VARIABLE DOSE PER UNSTABLE RENAL FUNCTION (PHARMACIST DOSING): Status: DC

## 2019-07-22 MED ORDER — FENTANYL CITRATE (PF) 100 MCG/2ML IJ SOLN
INTRAMUSCULAR | Status: DC | PRN
  Administered 2019-07-22: 25 ug via INTRAVENOUS

## 2019-07-22 MED ORDER — PHENYLEPHRINE 40 MCG/ML (10ML) SYRINGE FOR IV PUSH (FOR BLOOD PRESSURE SUPPORT)
PREFILLED_SYRINGE | INTRAVENOUS | Status: AC
  Filled 2019-07-22: qty 10

## 2019-07-22 MED ORDER — SODIUM CHLORIDE 0.9 % IR SOLN
Status: DC | PRN
  Administered 2019-07-22: 3000 mL

## 2019-07-22 MED ORDER — OXYCODONE HCL 5 MG PO TABS: 5.0000 mg | ORAL_TABLET | Freq: Once | ORAL | Status: DC | PRN

## 2019-07-22 MED ORDER — SODIUM CHLORIDE 0.9 % IV SOLN
INTRAVENOUS | Status: AC
  Administered 2019-07-22 (×3): via INTRAVENOUS

## 2019-07-22 MED ORDER — VANCOMYCIN HCL 1000 MG IV SOLR
INTRAVENOUS | Status: DC | PRN
  Administered 2019-07-22: 1000 mg via TOPICAL

## 2019-07-22 MED ORDER — EPHEDRINE 5 MG/ML INJ
INTRAVENOUS | Status: AC
  Filled 2019-07-22: qty 10

## 2019-07-22 MED ORDER — ONDANSETRON HCL 4 MG/2ML IJ SOLN
INTRAMUSCULAR | Status: DC | PRN
  Administered 2019-07-22: 4 mg via INTRAVENOUS

## 2019-07-22 MED ORDER — FENTANYL CITRATE (PF) 100 MCG/2ML IJ SOLN
INTRAMUSCULAR | Status: AC
  Administered 2019-07-22: 50 ug via INTRAVENOUS
  Filled 2019-07-22: qty 2

## 2019-07-22 MED ORDER — BUPIVACAINE HCL 0.5 % IJ SOLN
INTRAMUSCULAR | Status: DC | PRN
  Administered 2019-07-22: 10 mL

## 2019-07-22 MED ORDER — PROPOFOL 10 MG/ML IV BOLUS
INTRAVENOUS | Status: AC
  Filled 2019-07-22: qty 20

## 2019-07-22 MED ORDER — BUPIVACAINE HCL (PF) 0.5 % IJ SOLN
INTRAMUSCULAR | Status: AC
  Filled 2019-07-22: qty 30

## 2019-07-22 MED ORDER — 0.9 % SODIUM CHLORIDE (POUR BTL) OPTIME
TOPICAL | Status: DC | PRN
  Administered 2019-07-22: 1000 mL

## 2019-07-22 MED ORDER — LIDOCAINE 2% (20 MG/ML) 5 ML SYRINGE
INTRAMUSCULAR | Status: AC
  Filled 2019-07-22: qty 5

## 2019-07-22 MED ORDER — MIDAZOLAM HCL 5 MG/5ML IJ SOLN
INTRAMUSCULAR | Status: DC | PRN
  Administered 2019-07-22: 2 mg via INTRAVENOUS

## 2019-07-22 MED ORDER — ONDANSETRON HCL 4 MG/2ML IJ SOLN: 4.0000 mg | Freq: Four times a day (QID) | INTRAMUSCULAR | Status: DC | PRN

## 2019-07-22 MED ORDER — LIDOCAINE 2% (20 MG/ML) 5 ML SYRINGE
INTRAMUSCULAR | Status: DC | PRN
  Administered 2019-07-22: 80 mg via INTRAVENOUS

## 2019-07-22 MED ORDER — OXYCODONE HCL 5 MG/5ML PO SOLN: 5.0000 mg | Freq: Once | ORAL | Status: DC | PRN

## 2019-07-22 MED ORDER — INSULIN ASPART 100 UNIT/ML ~~LOC~~ SOLN
5.0000 [IU] | Freq: Once | SUBCUTANEOUS | Status: AC
  Administered 2019-07-22: 04:00:00 5 [IU] via SUBCUTANEOUS

## 2019-07-22 MED ORDER — FENTANYL CITRATE (PF) 100 MCG/2ML IJ SOLN
25.0000 ug | INTRAMUSCULAR | Status: DC | PRN
  Administered 2019-07-22: 19:00:00 50 ug via INTRAVENOUS

## 2019-07-22 MED ORDER — VANCOMYCIN HCL 1000 MG IV SOLR
INTRAVENOUS | Status: AC
  Filled 2019-07-22: qty 1000

## 2019-07-22 MED ORDER — INSULIN GLARGINE 100 UNIT/ML ~~LOC~~ SOLN
45.0000 [IU] | Freq: Every day | SUBCUTANEOUS | Status: DC
  Administered 2019-07-22: 45 [IU] via SUBCUTANEOUS
  Filled 2019-07-22: qty 0.45

## 2019-07-22 MED ORDER — MIDAZOLAM HCL 2 MG/2ML IJ SOLN
INTRAMUSCULAR | Status: AC
  Filled 2019-07-22: qty 2

## 2019-07-22 MED ORDER — FENTANYL CITRATE (PF) 100 MCG/2ML IJ SOLN
INTRAMUSCULAR | Status: AC
  Filled 2019-07-22: qty 2

## 2019-07-22 MED ORDER — LACTATED RINGERS IV SOLN
INTRAVENOUS | Status: DC
  Administered 2019-07-22: 18:00:00 via INTRAVENOUS

## 2019-07-22 MED ORDER — PROPOFOL 10 MG/ML IV BOLUS
INTRAVENOUS | Status: DC | PRN
  Administered 2019-07-22: 160 mg via INTRAVENOUS

## 2019-07-22 SURGICAL SUPPLY — 46 items
APL PRP STRL LF DISP 70% ISPRP (MISCELLANEOUS) ×1
BLADE HEX COATED 2.75 (ELECTRODE) ×2 IMPLANT
BLADE SURG 15 STRL LF DISP TIS (BLADE) ×1 IMPLANT
BLADE SURG 15 STRL SS (BLADE) ×2
BNDG CMPR 9X4 STRL LF SNTH (GAUZE/BANDAGES/DRESSINGS) ×1
BNDG CMPR MED 10X6 ELC LF (GAUZE/BANDAGES/DRESSINGS) ×1
BNDG ELASTIC 3X5.8 VLCR STR LF (GAUZE/BANDAGES/DRESSINGS) ×2 IMPLANT
BNDG ELASTIC 6X10 VLCR STRL LF (GAUZE/BANDAGES/DRESSINGS) ×1 IMPLANT
BNDG ESMARK 4X9 LF (GAUZE/BANDAGES/DRESSINGS) ×2 IMPLANT
BNDG GAUZE ELAST 4 BULKY (GAUZE/BANDAGES/DRESSINGS) ×2 IMPLANT
CANISTER WOUNDNEG PRESSURE 500 (CANNISTER) ×1 IMPLANT
CHLORAPREP W/TINT 26 (MISCELLANEOUS) ×2 IMPLANT
COVER BACK TABLE 60X90IN (DRAPES) ×2 IMPLANT
CUFF TOURN SGL QUICK 18X4 (TOURNIQUET CUFF) ×2 IMPLANT
DRAPE EXTREMITY T 121X128X90 (DISPOSABLE) ×1 IMPLANT
DRAPE IMP U-DRAPE 54X76 (DRAPES) ×1 IMPLANT
DRAPE POUCH INSTRU U-SHP 10X18 (DRAPES) ×2 IMPLANT
DRAPE SHEET LG 3/4 BI-LAMINATE (DRAPES) ×5 IMPLANT
DRSG PAD ABDOMINAL 8X10 ST (GAUZE/BANDAGES/DRESSINGS) IMPLANT
DRSG VAC ATS MED SENSATRAC (GAUZE/BANDAGES/DRESSINGS) ×1 IMPLANT
ELECT PENCIL ROCKER SW 15FT (MISCELLANEOUS) ×2 IMPLANT
GAUZE SPONGE 4X4 12PLY STRL (GAUZE/BANDAGES/DRESSINGS) ×2 IMPLANT
GAUZE XEROFORM 1X8 LF (GAUZE/BANDAGES/DRESSINGS) IMPLANT
GLOVE BIO SURGEON STRL SZ7.5 (GLOVE) ×2 IMPLANT
GLOVE BIOGEL PI IND STRL 8 (GLOVE) ×1 IMPLANT
GLOVE BIOGEL PI INDICATOR 8 (GLOVE) ×1
GOWN STRL REUS W/TWL XL LVL3 (GOWN DISPOSABLE) ×4 IMPLANT
IRRIG SUCT STRYKERFLOW 2 WTIP (MISCELLANEOUS) ×2
IRRIGATION SUCT STRKRFLW 2 WTP (MISCELLANEOUS) IMPLANT
KIT BASIN OR (CUSTOM PROCEDURE TRAY) ×2 IMPLANT
KIT TURNOVER KIT A (KITS) IMPLANT
NDL HYPO 25X1 1.5 SAFETY (NEEDLE) ×1 IMPLANT
NEEDLE HYPO 25X1 1.5 SAFETY (NEEDLE) ×2 IMPLANT
PADDING CAST COTTON 6X4 STRL (CAST SUPPLIES) ×1 IMPLANT
SET IRRIG Y TYPE TUR BLADDER L (SET/KITS/TRAYS/PACK) IMPLANT
STAPLER VISISTAT 35W (STAPLE) ×2 IMPLANT
STOCKINETTE 6  STRL (DRAPES) ×2
STOCKINETTE 6 STRL (DRAPES) ×1 IMPLANT
SUT ETHILON 4 0 PS 2 18 (SUTURE) IMPLANT
SUT MNCRL AB 3-0 PS2 18 (SUTURE) IMPLANT
SUT MON AB 5-0 PS2 18 (SUTURE) IMPLANT
SUT VIC AB 3-0 PS2 18 (SUTURE)
SUT VIC AB 3-0 PS2 18XBRD (SUTURE) IMPLANT
SUT VIC AB 4-0 PS2 18 (SUTURE) ×2 IMPLANT
SYR CONTROL 10ML LL (SYRINGE) ×2 IMPLANT
YANKAUER SUCT BULB TIP 10FT TU (MISCELLANEOUS) ×2 IMPLANT

## 2019-07-22 NOTE — Progress Notes (Signed)
PT Cancellation Note  Patient Details Name: Katherine Herrera MRN: [id number] DOB: Mar 13, 1966   Cancelled Treatment:    Reason Eval/Treat Not Completed: Other (comment) Pt just had pain meds and getting ready for surgery later today.  Will check back as schedule permits.   Stevon Gough,[name] 07/22/2019, 2:03 PM Carmelia Bake, PT, DPT Acute Rehabilitation Services Office: [phone] Pager: [phone]

## 2019-07-22 NOTE — Op Note (Signed)
Patient Name: Katherine Herrera DOB: 1966/06/25  MRN: [id number]   Date of Service: 07/22/2019  Surgeon: Dr. Hardie Pulley, DPM Assistants: None Pre-operative Diagnosis:  Right foot ulcer Post-operative Diagnosis:  * No Diagnosis Codes entered * Procedures:  1) Right foot debridement and irrigation  2) Application of wound VAC Pathology/Specimens: * No specimens in log * Anesthesia: MAC/local Hemostasis: * No tourniquets in log * Estimated Blood Loss: 10 mL Materials: * No implants in log * Medications: 1g vancomycin powder Complications: none  Indications for Procedure:  This is a 53 y.o. female with a wound infection to the right foot. Debridement and wound VAC indicated to assist in resolution of infection and healing of the wound.    Procedure in Detail: Patient was identified in pre-operative holding area. Formal consent was signed and the right lower extremity was marked. Patient was brought back to the operating room and placed on the operating room table in the supine position. Anesthesia was induced.   The extremity was prepped and draped in the usual sterile fashion. Timeout was taken to confirm patient name, laterality, and procedure prior to incision. Attention was then directed to the right foot where a wound measuring 3x2 was encountered.  The wound was sharply excisionally debrided with a 15 blade, followed by a misonix ultrasonic debrider. Debridement was performed to bleeding, viable wound base. The wound was debrided to the level of the fascial tissue. Following debridement the wound measured 4x2.  A wound VAC was applied, with a black sponge adhered to the wound bed, followed by occlusive dressing. The trackpad was placed and set to the wound VAC machine at 125 mmHg.   The foot was then dressed with cast padding and ACE bandage. Patient tolerated the procedure well.   Disposition: Following a period of post-operative monitoring, patient will be transferred back to  the floor.

## 2019-07-22 NOTE — Transfer of Care (Signed)
Immediate Anesthesia Transfer of Care Note  Patient: Katherine Herrera  Procedure(s) Performed: IRRIGATION AND DEBRIDEMENT RIGHT FOOT; APPLICATION OF WOUND VAC (Right Foot)  Patient Location: PACU  Anesthesia Type:General  Level of Consciousness: awake, alert  and patient cooperative  Airway & Oxygen Therapy: Patient Spontanous Breathing and Patient connected to face mask oxygen  Post-op Assessment: Report given to RN and Post -op Vital signs reviewed and stable  Post vital signs: Reviewed and stable  Last Vitals:  Vitals Value Taken Time  BP 128/67 07/22/19 1903  Temp    Pulse 74 07/22/19 1906  Resp 11 07/22/19 1906  SpO2 99 % 07/22/19 1906  Vitals shown include unvalidated device data.  Last Pain:  Vitals:   07/22/19 1754  TempSrc:   PainSc: Asleep      Patients Stated Pain Goal: 2 ([id number])  Complications: No apparent anesthesia complications

## 2019-07-22 NOTE — Progress Notes (Signed)
npatient Diabetes Program Recommendations  AACE/ADA: New Consensus Statement on Inpatient Glycemic Control (2015)  Target Ranges:  Prepandial:   less than 140 mg/dL      Peak postprandial:   less than 180 mg/dL (1-2 hours)      Critically ill patients:  140 - 180 mg/dL   Lab Results  Component Value Date   GLUCAP 264 (H) 07/22/2019   HGBA1C 7.8 (H) 07/19/2019    Review of Glycemic Control Results for RHYEN, MCMICKENS (MRN [id number]) as of 07/22/2019 09:30  Ref. Range 07/21/2019 07:54 07/21/2019 11:38 07/21/2019 16:13 07/21/2019 20:25 07/21/2019 23:59 07/22/2019 03:06 07/22/2019 04:39 07/22/2019 07:58  Glucose-Capillary Latest Ref Range: 70 - 99 mg/dL 271 (H)  Novolog 5 units 314 (H)  Novolog 11 units 301 (H)  Novolog 15 units 482 (H) 467 (H)     Lantus 35 units 402 (H) 376 (H) 264 (H)  Novolog 16 units   Diabetes history: DM 2 Outpatient Diabetes medications: 70/30 70 units tid Current orders for Inpatient glycemic control:  Lantus 45 units qhs Novolog 0-15 units tid Novolog 8 units tid meal coverage  A1c 7.8% on 9/18  Inpatient Diabetes Program Recommendations:    Noted NPO after 10 am. Glucose in 400's yest evening into this am. Pt on a large dose of 70/30 tid at home. Lantus increased from 35 to 45 units qhs. Also Noted Novolog 8 units meal coverage added.   Based on the amount of Novolog given as trends increased. Pt may need more meal coverage when eating.  May also consider adding bedtime Novolog scale.  Will watch trends on current regimen.  Thanks,  Tama Headings RN, MSN, BC-ADM Inpatient Diabetes Coordinator Team Pager [phone] (8a-5p)

## 2019-07-22 NOTE — Anesthesia Preprocedure Evaluation (Signed)
Anesthesia Evaluation  Patient identified by MRN, date of birth, ID band Patient awake    Reviewed: Allergy & Precautions, H&P , NPO status , Patient's Chart, lab work & pertinent test results  Airway Mallampati: II   Neck ROM: full    Dental   Pulmonary COPD, Current Smoker and Patient abstained from smoking.,    breath sounds clear to auscultation       Cardiovascular + DOE   Rhythm:regular Rate:Normal     Neuro/Psych  Headaches, PSYCHIATRIC DISORDERS Anxiety  Neuromuscular disease    GI/Hepatic   Endo/Other  diabetes, Type 2Hypothyroidism obese  Renal/GU Renal disease     Musculoskeletal  (+) Arthritis ,   Abdominal   Peds  Hematology   Anesthesia Other Findings   Reproductive/Obstetrics                             Anesthesia Physical Anesthesia Plan  ASA: II  Anesthesia Plan: General   Post-op Pain Management:    Induction: Intravenous  PONV Risk Score and Plan: 2 and Ondansetron, Dexamethasone, Midazolam and Treatment may vary due to age or medical condition  Airway Management Planned: LMA  Additional Equipment:   Intra-op Plan:   Post-operative Plan:   Informed Consent: I have reviewed the patients History and Physical, chart, labs and discussed the procedure including the risks, benefits and alternatives for the proposed anesthesia with the patient or authorized representative who has indicated his/her understanding and acceptance.       Plan Discussed with: CRNA, Anesthesiologist and Surgeon  Anesthesia Plan Comments:         Anesthesia Quick Evaluation

## 2019-07-22 NOTE — Progress Notes (Signed)
Subjective:  Patient ID: Katherine Herrera, female    DOB: 03/20/1966,  MRN: [id number]  Seen in pre-op. Understands plan for OR today. Still having pain issues RLE. Objective:   Vitals:   07/22/19 1754 07/22/19 1755  BP:  (!) 159/85  Pulse: (!) 59 61  Resp:    Temp:    SpO2: 94% 94%   General AA&O x3. Normal mood and affect.  Vascular Dorsalis pedis and posterior tibial pulses 2/4 bilat. Brisk capillary refill to all digits. Pedal hair present.  Neurologic Epicritic sensation grossly diminished.  Dermatologic R foot dressing c/d/i.  Orthopedic: MMT 5/5 in dorsiflexion, plantarflexion, inversion, and eversion. Normal joint ROM without pain or crepitus.   Results for orders placed or performed during the hospital encounter of 07/19/19 (from the past 24 hour(s))  Glucose, capillary     Status: Abnormal   Collection Time: 07/21/19  8:25 PM  Result Value Ref Range   Glucose-Capillary 482 (H) 70 - 99 mg/dL  Glucose, capillary     Status: Abnormal   Collection Time: 07/21/19 11:59 PM  Result Value Ref Range   Glucose-Capillary 467 (H) 70 - 99 mg/dL  Glucose, capillary     Status: Abnormal   Collection Time: 07/22/19  3:06 AM  Result Value Ref Range   Glucose-Capillary 402 (H) 70 - 99 mg/dL  CBC     Status: Abnormal   Collection Time: 07/22/19  4:38 AM  Result Value Ref Range   WBC 7.7 4.0 - 10.5 K/uL   RBC 4.25 3.87 - 5.11 MIL/uL   Hemoglobin 11.7 (L) 12.0 - 15.0 g/dL   HCT 37.5 36.0 - 46.0 %   MCV 88.2 80.0 - 100.0 fL   MCH 27.5 26.0 - 34.0 pg   MCHC 31.2 30.0 - 36.0 g/dL   RDW 12.7 11.5 - 15.5 %   Platelets 367 150 - 400 K/uL   nRBC 0.0 0.0 - 0.2 %  Basic metabolic panel     Status: Abnormal   Collection Time: 07/22/19  4:38 AM  Result Value Ref Range   Sodium 134 (L) 135 - 145 mmol/L   Potassium 4.6 3.5 - 5.1 mmol/L   Chloride 97 (L) 98 - 111 mmol/L   CO2 28 22 - 32 mmol/L   Glucose, Bld 384 (H) 70 - 99 mg/dL   BUN 28 (H) 6 - 20 mg/dL   Creatinine, Ser 1.39 (H)  0.44 - 1.00 mg/dL   Calcium 8.6 (L) 8.9 - 10.3 mg/dL   GFR calc non Af Amer 43 (L) >60 mL/min   GFR calc Af Amer 50 (L) >60 mL/min   Anion gap 9 5 - 15  Glucose, capillary     Status: Abnormal   Collection Time: 07/22/19  4:39 AM  Result Value Ref Range   Glucose-Capillary 376 (H) 70 - 99 mg/dL  Glucose, capillary     Status: Abnormal   Collection Time: 07/22/19  7:58 AM  Result Value Ref Range   Glucose-Capillary 264 (H) 70 - 99 mg/dL  Glucose, capillary     Status: Abnormal   Collection Time: 07/22/19 11:22 AM  Result Value Ref Range   Glucose-Capillary 373 (H) 70 - 99 mg/dL  Glucose, capillary     Status: Abnormal   Collection Time: 07/22/19  4:54 PM  Result Value Ref Range   Glucose-Capillary 168 (H) 70 - 99 mg/dL    Results for orders placed or performed during the hospital encounter of 07/19/19  SARS CORONAVIRUS 2 (TAT  6-24 HRS) Nasopharyngeal Nasopharyngeal Swab     Status: None   Collection Time: 07/19/19  9:43 PM   Specimen: Nasopharyngeal Swab  Result Value Ref Range Status   SARS Coronavirus 2 NEGATIVE NEGATIVE Final    Comment: (NOTE) SARS-CoV-2 target nucleic acids are NOT DETECTED. The SARS-CoV-2 RNA is generally detectable in upper and lower respiratory specimens during the acute phase of infection. Negative results do not preclude SARS-CoV-2 infection, do not rule out co-infections with other pathogens, and should not be used as the sole basis for treatment or other patient management decisions. Negative results must be combined with clinical observations, patient history, and epidemiological information. The expected result is Negative. Fact Sheet for Patients: SugarRoll.be Fact Sheet for Healthcare Providers: https://www.woods-mathews.com/ This test is not yet approved or cleared by the [place] FDA and  has been authorized for detection and/or diagnosis of SARS-CoV-2 by FDA under an Emergency Use Authorization  (EUA). This EUA will remain  in effect (meaning this test can be used) for the duration of the COVID-19 declaration under Section 56 4(b)(1) of the Act, 21 U.S.C. section 360bbb-3(b)(1), unless the authorization is terminated or revoked sooner. Performed at [facility], [place] [address], [place], [place] [address]   MRSA PCR Screening     Status: None   Collection Time: 07/19/19  9:54 PM   Specimen: Nasal Mucosa; Nasopharyngeal  Result Value Ref Range Status   MRSA by PCR NEGATIVE NEGATIVE Final    Comment:        The GeneXpert MRSA Assay (FDA approved for NASAL specimens only), is one component of a comprehensive MRSA colonization surveillance program. It is not intended to diagnose MRSA infection nor to guide or monitor treatment for MRSA infections. Performed at [facility], [place] [address], [place], [place] [address]   Blood Cultures x 2 sites     Status: None (Preliminary result)   Collection Time: 07/19/19 11:20 PM   Specimen: BLOOD RIGHT FOREARM  Result Value Ref Range Status   Specimen Description BLOOD RIGHT FOREARM  Final   Special Requests   Final    BOTTLES DRAWN AEROBIC ONLY Blood Culture results may not be optimal due to an excessive volume of blood received in culture bottles   Culture   Final    NO GROWTH 2 DAYS Performed at [facility], [place] [address], [place], [place] [address]    Report Status PENDING  Incomplete  Blood Cultures x 2 sites     Status: None (Preliminary result)   Collection Time: 07/19/19 11:20 PM   Specimen: BLOOD  Result Value Ref Range Status   Specimen Description   Final    BLOOD RIGHT ANTECUBITAL Performed at [place] [address], [place], [place] [address]    Special Requests   Final    BOTTLES DRAWN AEROBIC ONLY Blood Culture results may not be optimal due to an excessive volume of blood received in culture bottles   Culture   Final    NO GROWTH 2 DAYS Performed at  [facility], [place] [address] [place], [place] [address]    Report Status PENDING  Incomplete  Aerobic/Anaerobic Culture (surgical/deep wound)     Status: None (Preliminary result)   Collection Time: 07/20/19  2:40 PM   Specimen: PATH Other; Tissue  Result Value Ref Range Status   Specimen Description   Final    WOUND RIGHT FOOT Performed at [facility], [address] [place], [place] [address]    Special Requests   Final    NONE Performed at [facility], [place] [address], [place], [place] [address]    Gram Stain   Final    RARE WBC PRESENT, PREDOMINANTLY PMN NO ORGANISMS SEEN Performed at [facility], [place] [address], [place],  [address]    Culture FEW YEAST  Final   Report Status PENDING  Incomplete    Assessment & Plan:  Patient was evaluated and treated and all questions answered.  Right Foot Cellulitis with wound dehiscence s/p Debridement and irrigation -Continue abx. Cultures NGTD. -Continue pain meds PRN -OR tonight for repeat debridement, wound VAC application  Will follow post-op.  Evelina Bucy, DPM  Accessible via secure chat for questions or concerns.

## 2019-07-22 NOTE — Care Management Important Message (Signed)
Important Message  Patient Details IM Letter given to Sharren Bridge SW to present to the Patient Name: Katherine Herrera MRN: [id number] Date of Birth: Sep 21, 1966   Medicare Important Message Given:  Yes     Kerin Salen 07/22/2019, 11:18 AM

## 2019-07-22 NOTE — Anesthesia Procedure Notes (Signed)
Procedure Name: LMA Insertion Date/Time: 07/22/2019 6:21 PM Performed by: West Pugh, CRNA Pre-anesthesia Checklist: Patient identified, Emergency Drugs available, Suction available, Patient being monitored and Timeout performed Patient Re-evaluated:Patient Re-evaluated prior to induction Oxygen Delivery Method: Circle system utilized Preoxygenation: Pre-oxygenation with 100% oxygen Induction Type: IV induction LMA: LMA with gastric port inserted LMA Size: 4.0 Number of attempts: 1 Placement Confirmation: positive ETCO2 and breath sounds checked- equal and bilateral Tube secured with: Tape Dental Injury: Teeth and Oropharynx as per pre-operative assessment

## 2019-07-22 NOTE — Progress Notes (Signed)
  Subjective:  Patient ID: Katherine Herrera, female    DOB: 28-Dec-1965,  MRN: [id number] *Late entry  Patient seen bedside. Having difficulty with pain control, could not do therapy today. Otherwise no new complaints. Objective:   Vitals:   07/21/19 2356 07/22/19 0444  BP: 137/75 124/79  Pulse: 65 (!) 55  Resp: 20 18  Temp: 98.3 F (36.8 C) 98.1 F (36.7 C)  SpO2: 90% 94%   General AA&O x3. Normal mood and affect.  Vascular Dorsalis pedis and posterior tibial pulses 2/4 bilat. Brisk capillary refill to all digits. Pedal hair present.  Neurologic Epicritic sensation grossly diminished.  Dermatologic R foot wound healing with decreased cellulitis, no active warmth, no purulence.  Orthopedic: MMT 5/5 in dorsiflexion, plantarflexion, inversion, and eversion. Normal joint ROM without pain or crepitus.   Assessment & Plan:  Patient was evaluated and treated and all questions answered.  Right Foot Cellulitis with wound dehiscence s/p Debridement and irrigation -Packing pulled from wound. -Wound cleansed and new dressing applied -Continue abx. Cultures NGTD. -Continue pain meds PRN -Work with therapy when able.  -Will plan for repeat debridement, wound VAC application on Monday. Plan for surgery in the afternoon. NPO past 10am.   Evelina Bucy, DPM  Accessible via secure chat for questions or concerns.

## 2019-07-22 NOTE — Progress Notes (Signed)
Pharmacy Antibiotic Note  Katherine Herrera is a 53 y.o. female admitted on 07/19/2019 with wound infection.  Pharmacy has been consulted for zosyn and vancomycin dosing.  07/22/2019 Scr 1.39, CrCl ~ 59.81mls/min WBC 7.7 Afebrile Scheduled for OR today   Plan: -Due to rising Scr will hold vancomycin and obtain random vanc level tomorrow morning. -Continue ZEI -Daily SCr -F/u cultures/levels   Height: 5\' 9"  (175.3 cm) Weight: 224 lb 6.9 oz (101.8 kg) IBW/kg (Calculated) : 66.2Temp (24hrs), Avg:98.3 F (36.8 C), Min:98.1 F (36.7 C), Max:98.4 F (36.9 C)  Recent Labs  Lab 07/19/19 2320 07/20/19 0451 07/21/19 0518 07/22/19 0438  WBC 11.4* 9.9  --  7.7  CREATININE 1.16* 1.11* 1.18* 1.39*    Estimated Creatinine Clearance: 59.4 mL/min (A) (by C-G formula based on SCr of 1.39 mg/dL (H)).    Allergies  Allergen Reactions  . Hydromorphone Hcl Itching    Patient has been tolerating Hydromorphone tablets without adverse effect (07/09/19)  . Cephalexin Nausea And Vomiting  . Demerol  [Meperidine Hcl] Rash  . Lovastatin Other (See Comments)    Other reaction(s): Unknown (See Comments) Possible myalgia Unknown   . Metformin And Related Nausea And Vomiting  . Sulfamethoxazole-Trimethoprim     Other reaction(s): Unknown DILI, pancreatitis  . Crestor [Rosuvastatin Calcium]     myalgia  . Niacin Other (See Comments)    Unknown   . Paroxetine Other (See Comments)    Other reaction(s): Unknown (See Comments) unknown Unknown     Antimicrobials this admission: 9/18 zosyn >>  9/18 vancomycin >>   Dose adjustments this admission:   Microbiology results:  9/18 BCx: ngtd  UCx:    Sputum:    9/18 MRSA PCR: negative  Thank you for allowing pharmacy to be a part of this patient's care.  Dolly Rias RPh 07/22/2019, 11:17 AM Pager [phone]

## 2019-07-22 NOTE — Progress Notes (Addendum)
PROGRESS NOTE                                                                                                                                                                                                             Patient Demographics:    Katherine Herrera, is a 53 y.o. female, DOB - 1966/05/16, [id number]  Admit date - 07/19/2019   Admitting Physician Toy Baker, MD  Outpatient Primary MD for the patient is Plotnikov, [name], MD  LOS - 3  Outpatient Specialists: Podiatry  No chief complaint on file.      Brief Narrative 53 y/o female with history of type 2 diabetes mellitus, Charcot deformity of right foot status post fifth metatarsal resection recently, hypothyroidism, COPD, chronic pain and hyperlipidemia presented with worsened wound over the right foot metatarsal resection that she had about 2 weeks back.  She was sent home on wound VAC and Augmentin but about 6 days back the wound VAC stopped working.  She was seen in the office by her podiatrist on the day of admission and recommended her to be admitted to the hospital for IV antibiotics and surgical intervention. Patient denies any fevers, chills or nausea.  Reports that her CBG has been running in the 50s to 60s last few days despite adequate meal intake.    Subjective:   Right foot pain improved compared to yesterday.  CBG markedly elevated.  Plan for OR with wound VAC placement this evening.   Assessment  & Plan :   Principal problem Right diabetic foot ulcer, with poor healing Continue empiric IV vancomycin and Zosyn.    Underwent I&D of the right foot by podiatry on 9/19.   Pain control with PRN tramadol, home dose Dilaudid and PRN Toradol, better controlled today. Blood and wound culture pending.  Type 2 diabetes mellitus, uncontrolled with hyperglycemia A1c of 7.8.  Hyperglycemic with blood glucose in 300-400.  Lantus dose further  increased to 45 units daily and increase pre-meal aspart 7 units 3 times daily.  Patient will be n.p.o.  for OR later this afternoon   Acute kidney injury Creatinine worsened to 1.36, baseline 1.1.  Will order IV fluids.  Monitor closely while she is getting PRN Toradol for pain  Hypothyroidism Continue Synthroid  Diabetic polyneuropathy Continue Neurontin  Tobacco abuse Counseled on cessation  Chronic pain  Home meds continued   Code Status : Full code  Family Communication  : None  Disposition Plan  : Home once clinically improved, pending OR  Barriers For Discharge : Active symptoms  Consults  : Podiatry  Procedures  : None  DVT Prophylaxis  :  Lovenox -   Lab Results  Component Value Date   PLT 367 07/22/2019    Antibiotics  :    Anti-infectives (From admission, onward)   Start     Dose/Rate Route Frequency Ordered Stop   07/22/19 1112  vancomycin variable dose per unstable renal function (pharmacist dosing)      Does not apply See admin instructions 07/22/19 1113     07/21/19 0300  vancomycin (VANCOCIN) 1,500 mg in sodium chloride 0.9 % 500 mL IVPB  Status:  Discontinued     1,500 mg 250 mL/hr over 120 Minutes Intravenous Daily 07/20/19 0120 07/22/19 1113   07/20/19 0900  piperacillin-tazobactam (ZOSYN) IVPB 3.375 g     3.375 g 12.5 mL/hr over 240 Minutes Intravenous Every 8 hours 07/20/19 0120     07/19/19 2230  vancomycin (VANCOCIN) 2,000 mg in sodium chloride 0.9 % 500 mL IVPB     2,000 mg 250 mL/hr over 120 Minutes Intravenous  Once 07/19/19 2200 07/20/19 0243   07/19/19 2200  piperacillin-tazobactam (ZOSYN) IVPB 3.375 g     3.375 g 100 mL/hr over 30 Minutes Intravenous NOW 07/19/19 2200 07/20/19 0317        Objective:   Vitals:   07/21/19 1006 07/21/19 1354 07/21/19 2356 07/22/19 0444  BP: 107/64 116/61 137/75 124/79  Pulse: 64 68 65 (!) 55  Resp: 16 16 20 18   Temp: 98.2 F (36.8 C) 98.4 F (36.9 C) 98.3 F (36.8 C) 98.1 F (36.7 C)   TempSrc: Oral Oral Oral Oral  SpO2: 90% 93% 90% 94%  Weight:      Height:        Wt Readings from Last 3 Encounters:  07/19/19 101.8 kg  07/05/19 103.8 kg  07/01/19 103.9 kg    No intake or output data in the 24 hours ending 07/22/19 1139  Physical exam Not in distress HEENT: Moist mucosa, supple neck Chest: Clear bilaterally CVs: Normal S1-S2 GI: Soft, nondistended, nontender Musculoskeletal: Dressing over right foot, minimal tenderness, no edema      Data Review:    CBC Recent Labs  Lab 07/19/19 2320 07/20/19 0451 07/22/19 0438  WBC 11.4* 9.9 7.7  HGB 13.6 12.1 11.7*  HCT 43.1 38.7 37.5  PLT 492* 448* 367  MCV 88.3 88.2 88.2  MCH 27.9 27.6 27.5  MCHC 31.6 31.3 31.2  RDW 13.4 13.3 12.7  LYMPHSABS 3.9  --   --   MONOABS 0.7  --   --   EOSABS 0.4  --   --   BASOSABS 0.1  --   --     Chemistries  Recent Labs  Lab 07/19/19 2320 07/20/19 0451 07/21/19 0518 07/22/19 0438  NA 139 138  --  134*  K 4.2 4.5  --  4.6  CL 98 102  --  97*  CO2 29 26  --  28  GLUCOSE 260* 164*  --  384*  BUN 19 23*  --  28*  CREATININE 1.16* 1.11* 1.18* 1.39*  CALCIUM 9.1 8.8*  --  8.6*  MG  --  2.1  --   --   AST 13* 11*  --   --   ALT 11 10  --   --  ALKPHOS 112 95  --   --   BILITOT 0.2* 0.4  --   --    ------------------------------------------------------------------------------------------------------------------ No results for input(s): CHOL, HDL, LDLCALC, TRIG, CHOLHDL, LDLDIRECT in the last 72 hours.  Lab Results  Component Value Date   HGBA1C 7.8 (H) 07/19/2019   ------------------------------------------------------------------------------------------------------------------ Recent Labs    07/20/19 0451  TSH 1.575   ------------------------------------------------------------------------------------------------------------------ No results for input(s): VITAMINB12, FOLATE, FERRITIN, TIBC, IRON, RETICCTPCT in the last 72 hours.  Coagulation  profile Recent Labs  Lab 07/19/19 2320  INR 1.0    No results for input(s): DDIMER in the last 72 hours.  Cardiac Enzymes No results for input(s): CKMB, TROPONINI, MYOGLOBIN in the last 168 hours.  Invalid input(s): CK ------------------------------------------------------------------------------------------------------------------    Component Value Date/Time   BNP 58.0 09/28/2018 2112    Inpatient Medications  Scheduled Meds:  baclofen  20 mg Oral BID   ezetimibe  10 mg Oral Daily   gabapentin  800 mg Oral QID   insulin aspart  0-15 Units Subcutaneous TID WC   insulin aspart  8 Units Subcutaneous TID WC   insulin glargine  45 Units Subcutaneous QHS   lamoTRIgine  25 mg Oral BID   levothyroxine  75 mcg Oral Daily   nutrition supplement (JUVEN)  1 packet Oral BID BM   rOPINIRole  0.5 mg Oral QHS   senna  1 tablet Oral BID   vancomycin variable dose per unstable renal function (pharmacist dosing)   Does not apply See admin instructions   Continuous Infusions:  sodium chloride 250 mL (07/20/19 0042)   piperacillin-tazobactam (ZOSYN)  IV 3.375 g (07/22/19 0533)   PRN Meds:.sodium chloride, acetaminophen **OR** acetaminophen, clonazePAM, HYDROmorphone, ketorolac, loratadine, ondansetron **OR** ondansetron (ZOFRAN) IV, polyethylene glycol, sodium chloride flush, sodium chloride flush, traMADol  Micro Results Recent Results (from the past 240 hour(s))  SARS CORONAVIRUS 2 (TAT 6-24 HRS) Nasopharyngeal Nasopharyngeal Swab     Status: None   Collection Time: 07/19/19  9:43 PM   Specimen: Nasopharyngeal Swab  Result Value Ref Range Status   SARS Coronavirus 2 NEGATIVE NEGATIVE Final    Comment: (NOTE) SARS-CoV-2 target nucleic acids are NOT DETECTED. The SARS-CoV-2 RNA is generally detectable in upper and lower respiratory specimens during the acute phase of infection. Negative results do not preclude SARS-CoV-2 infection, do not rule out co-infections with  other pathogens, and should not be used as the sole basis for treatment or other patient management decisions. Negative results must be combined with clinical observations, patient history, and epidemiological information. The expected result is Negative. Fact Sheet for Patients: SugarRoll.be Fact Sheet for Healthcare Providers: https://www.woods-mathews.com/ This test is not yet approved or cleared by the [place] FDA and  has been authorized for detection and/or diagnosis of SARS-CoV-2 by FDA under an Emergency Use Authorization (EUA). This EUA will remain  in effect (meaning this test can be used) for the duration of the COVID-19 declaration under Section 56 4(b)(1) of the Act, 21 U.S.C. section 360bbb-3(b)(1), unless the authorization is terminated or revoked sooner. Performed at [facility], [place] [address], [place], [place] [address]   MRSA PCR Screening     Status: None   Collection Time: 07/19/19  9:54 PM   Specimen: Nasal Mucosa; Nasopharyngeal  Result Value Ref Range Status   MRSA by PCR NEGATIVE NEGATIVE Final    Comment:        The GeneXpert MRSA Assay (FDA approved for NASAL specimens only), is one component of  a comprehensive MRSA colonization surveillance program. It is not intended to diagnose MRSA infection nor to guide or monitor treatment for MRSA infections. Performed at [facility], [place] [address], [place], [place] [address]   Blood Cultures x 2 sites     Status: None (Preliminary result)   Collection Time: 07/19/19 11:20 PM   Specimen: BLOOD RIGHT FOREARM  Result Value Ref Range Status   Specimen Description BLOOD RIGHT FOREARM  Final   Special Requests   Final    BOTTLES DRAWN AEROBIC ONLY Blood Culture results may not be optimal due to an excessive volume of blood received in culture bottles   Culture   Final    NO GROWTH 2 DAYS Performed at [facility], [place] [address] [place], [place] [address]    Report Status PENDING  Incomplete  Blood Cultures x 2 sites     Status: None (Preliminary result)   Collection Time: 07/19/19 11:20 PM   Specimen: BLOOD  Result Value Ref Range Status   Specimen Description   Final    BLOOD RIGHT ANTECUBITAL Performed at [place] [address], [place], [place] [address]    Special Requests   Final    BOTTLES DRAWN AEROBIC ONLY Blood Culture results may not be optimal due to an excessive volume of blood received in culture bottles   Culture   Final    NO GROWTH 2 DAYS Performed at [facility], [place] [address] [place], [place] [address]    Report Status PENDING  Incomplete  Aerobic/Anaerobic Culture (surgical/deep wound)     Status: None (Preliminary result)   Collection Time: 07/20/19  2:40 PM   Specimen: PATH Other; Tissue  Result Value Ref Range Status   Specimen Description   Final    WOUND RIGHT FOOT Performed at [facility], [place] [address], [place], [place] [address]    Special Requests   Final    NONE Performed at [facility], [place] [address] [place], [place] [address]    Gram Stain   Final    RARE WBC PRESENT, PREDOMINANTLY PMN NO ORGANISMS SEEN Performed at [facility], [place] [address] [place], [place] [address]    Culture FEW YEAST  Final   Report Status PENDING  Incomplete    Radiology Reports Mr Foot Right Wo Contrast  Result Date: 07/05/2019 CLINICAL DATA:  Diabetic with right foot ulcer and cellulitis. Limited response to antibiotics. Clinical concern of osteomyelitis. EXAM: MRI OF THE RIGHT FOREFOOT WITHOUT CONTRAST TECHNIQUE: Multiplanar, multisequence MR imaging of the right forefoot was performed. No intravenous contrast was administered. COMPARISON:  Radiographs 06/20/2019, 04/02/2019 and 01/02/2019. FINDINGS: Bones/Joint/Cartilage Patient underwent resection of the 5th metatarsal head on 01/02/2019. The amputation site appears  sharp. There is periosteal thickening involving the base of the 5th metatarsal which appears chronic. Low-level marrow T2 hyperintensity is present within the base of the 5th metatarsal without corresponding T1 signal abnormality or cortical destruction. There are overlying soft tissue changes with inflammation and possible soft tissue emphysema in this area. There is mild T2 hyperintensity laterally in the cuboid without corresponding T1 signal abnormality or cortical destruction. Apparent arthropathy at the 2nd DIP joint. No other focal osseous lesions are identified. There are mild degenerative changes in the midfoot. Ligaments Intact Lisfranc ligament. Muscles and Tendons Diffuse forefoot muscular atrophy without focal fluid collection. No significant tenosynovitis. Soft tissues There is skin irregularity and ulceration along the  plantar and lateral aspect of the 5th metatarsal base. There are inflammatory changes and possible soft tissue emphysema in the subcutaneous fat. There is no well-defined fluid collection. As noted above, the soft tissue changes are adjacent to marrow changes in the base of the 5th metatarsal. IMPRESSION: 1. Soft tissue ulceration along the plantar and lateral aspect of the 5th metatarsal base with underlying inflammatory changes and possible soft tissue emphysema. These findings are in close proximity to T2 marrow changes in the base of the 5th metatarsal, suspicious for early osteomyelitis. There is no cortical destruction. 2. 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 3. Apparent arthropathy at the 2nd DIP joint. Electronically Signed   By: Richardean Sale M.D.   On: 07/05/2019 12:46   Dg Foot 2 Views Right  Result Date: 07/05/2019 CLINICAL DATA:  Status post right fifth metatarsal resection, peroneal tenodesis, ulcer repair EXAM: RIGHT FOOT - 2 VIEW COMPARISON:  06/24/2019 FINDINGS: Redemonstrated postoperative findings of distal right fifth metatarsal amputation with new  overlying skin staples about the base of the right fifth metatarsal. There is soft tissue edema about the midfoot and ankle. Mild right first metatarsophalangeal arthrosis. IMPRESSION: Redemonstrated postoperative findings of distal right fifth metatarsal amputation with new overlying skin staples about the base of the right fifth metatarsal. There is soft tissue edema about the midfoot and ankle. Electronically Signed   By: Eddie Candle M.D.   On: 07/05/2019 16:45   Dg Foot Complete Right  Result Date: 07/22/2019 Please see detailed radiograph report in office note.  Dg Foot Complete Right  Result Date: 07/01/2019 Please see detailed radiograph report in office note.  Vas [place] Burnard Bunting With/wo Tbi  Result Date: 07/21/2019 LOWER EXTREMITY DOPPLER STUDY Indications: Ulceration. High Risk Factors: Diabetes. Other Factors: Post op foot infection worsening even with antibiotics. Admitted                for IV antibiotics and I&D of foot.  Vascular Interventions: 07/05/19 right 5th metatarsal resection. Comparison Study: 09/24/12 WNL Performing Technologist: June Leap Rdms, Rvt  Examination Guidelines: A complete evaluation includes at minimum, Doppler waveform signals and systolic blood pressure reading at the level of bilateral brachial, anterior tibial, and posterior tibial arteries, when vessel segments are accessible. Bilateral testing is considered an integral part of a complete examination. Photoelectric Plethysmograph (PPG) waveforms and toe systolic pressure readings are included as required and additional duplex testing as needed. Limited examinations for reoccurring indications may be performed as noted.  ABI Findings: +---------+------------------+-----+---------+-------------------------+  Right     Rt Pressure (mmHg) Index Waveform  Comment                    +---------+------------------+-----+---------+-------------------------+  Brachial                           triphasic BP not obtained due to  IV  +---------+------------------+-----+---------+-------------------------+  ATA       148                1.33  biphasic                             +---------+------------------+-----+---------+-------------------------+  PTA       142                1.28  triphasic                            +---------+------------------+-----+---------+-------------------------+  Great Toe 142                1.28  Abnormal                             +---------+------------------+-----+---------+-------------------------+ +---------+------------------+-----+---------+-------+  Left      Lt Pressure (mmHg) Index Waveform  Comment  +---------+------------------+-----+---------+-------+  Brachial  111                      triphasic          +---------+------------------+-----+---------+-------+  ATA       146                1.32  triphasic          +---------+------------------+-----+---------+-------+  PTA       155                1.40  biphasic           +---------+------------------+-----+---------+-------+  Great Toe 141                1.27  Abnormal           +---------+------------------+-----+---------+-------+  Summary: Right: Resting right ankle-brachial index indicates noncompressible right lower extremity arteries. The right toe-brachial index is normal. Left: Resting left ankle-brachial index indicates noncompressible left lower extremity arteries. The left toe-brachial index is normal.  *See table(s) above for measurements and observations.  Electronically signed by Ruta Hinds MD on 07/21/2019 at 11:00:34 AM.    Final    Vas [place] Lower Extremity Venous (dvt)  Result Date: 07/08/2019  Lower Venous Study Indications: Pain. Other Indications: Diabetic ulcer with necrosis of bone, osteomyelitis. Limitations: Body habitus, edema, poor ultrasound/tissue interface and orthopaedic appliance. Comparison Study: Prior negative left lower extremity venous duplex from 05/12/17                   is available for comparison.  Performing Technologist: Sharion Dove RVS  Examination Guidelines: A complete evaluation includes B-mode imaging, spectral Doppler, color Doppler, and power Doppler as needed of all accessible portions of each vessel. Bilateral testing is considered an integral part of a complete examination. Limited examinations for reoccurring indications may be performed as noted.  +---------+---------------+---------+-----------+----------+------------------+  RIGHT     Compressibility Phasicity Spontaneity Properties Thrombus Aging      +---------+---------------+---------+-----------+----------+------------------+  CFV       Full            Yes       Yes                                        +---------+---------------+---------+-----------+----------+------------------+  SFJ       Full                                                                 +---------+---------------+---------+-----------+----------+------------------+  FV Prox   Full                                                                 +---------+---------------+---------+-----------+----------+------------------+  FV Mid    Full                                                                 +---------+---------------+---------+-----------+----------+------------------+  FV Distal Full                                                                 +---------+---------------+---------+-----------+----------+------------------+  PFV       Full                                                                 +---------+---------------+---------+-----------+----------+------------------+  POP                       Yes       Yes                    Patent by color                                                                 Doppler             +---------+---------------+---------+-----------+----------+------------------+  PTV                                                        Not visualized       +---------+---------------+---------+-----------+----------+------------------+  PERO                                                       Not visualized      +---------+---------------+---------+-----------+----------+------------------+   +----+---------------+---------+-----------+----------+--------------+  LEFT Compressibility Phasicity Spontaneity Properties Thrombus Aging  +----+---------------+---------+-----------+----------+--------------+  CFV  Full            Yes       Yes                                    +----+---------------+---------+-----------+----------+--------------+     Summary: Right: There is no evidence of deep vein thrombosis in the lower extremity. However, portions of this examination were limited- see technologist comments above. Left: No evidence of common femoral vein obstruction.  *See table(s) above for measurements and observations. Electronically signed by Harold Barban MD on  07/08/2019 at 1:11:38 AM.    Final     Time Spent in minutes 35   Chip Canepa M.D on 07/22/2019 at 11:39 AM  Between 7am to 7pm - Pager - [phone]  After 7pm go to [email or web address] - password [facility]  [facility] -  Office  [phone]

## 2019-07-22 NOTE — Plan of Care (Signed)
Plan for OR around 530 pm for debridement, wound VAC application. NPO after 10am. Pre-op orders placed.  Evelina Bucy, DPM  Secure chat with questions or concerns.

## 2019-07-23 ENCOUNTER — Encounter (HOSPITAL_COMMUNITY): Payer: Self-pay | Admitting: Podiatry

## 2019-07-23 LAB — CREATININE, SERUM
Creatinine, Ser: 1.23 mg/dL — ABNORMAL HIGH (ref 0.44–1.00)
GFR calc Af Amer: 58 mL/min — ABNORMAL LOW (ref >60)
GFR calc non Af Amer: 50 mL/min — ABNORMAL LOW (ref >60)

## 2019-07-23 LAB — GLUCOSE, CAPILLARY
Glucose-Capillary: 191 mg/dL — ABNORMAL HIGH (ref 70–99)
Glucose-Capillary: 196 mg/dL — ABNORMAL HIGH (ref 70–99)
Glucose-Capillary: 263 mg/dL — ABNORMAL HIGH (ref 70–99)
Glucose-Capillary: 323 mg/dL — ABNORMAL HIGH (ref 70–99)
Glucose-Capillary: 340 mg/dL — ABNORMAL HIGH (ref 70–99)
Glucose-Capillary: 370 mg/dL — ABNORMAL HIGH (ref 70–99)

## 2019-07-23 LAB — VANCOMYCIN, RANDOM: Vancomycin Rm: 11

## 2019-07-23 MED ORDER — SODIUM CHLORIDE 0.9% FLUSH
3.0000 mL | Freq: Two times a day (BID) | INTRAVENOUS | Status: DC
  Administered 2019-07-24 – 2019-07-25 (×2): 3 mL via INTRAVENOUS

## 2019-07-23 MED ORDER — INSULIN GLARGINE 100 UNIT/ML ~~LOC~~ SOLN: 55.0000 [IU] | Freq: Every day | SUBCUTANEOUS | Status: DC

## 2019-07-23 MED ORDER — INSULIN GLARGINE 100 UNIT/ML ~~LOC~~ SOLN
35.0000 [IU] | Freq: Two times a day (BID) | SUBCUTANEOUS | Status: DC
  Administered 2019-07-23 (×2): 35 [IU] via SUBCUTANEOUS
  Filled 2019-07-23 (×4): qty 0.35

## 2019-07-23 MED ORDER — INSULIN ASPART 100 UNIT/ML ~~LOC~~ SOLN
12.0000 [IU] | Freq: Three times a day (TID) | SUBCUTANEOUS | Status: DC
  Administered 2019-07-23: 12 [IU] via SUBCUTANEOUS

## 2019-07-23 MED ORDER — VANCOMYCIN HCL 10 G IV SOLR
1500.0000 mg | Freq: Every day | INTRAVENOUS | Status: DC
  Administered 2019-07-23 – 2019-07-24 (×2): 1500 mg via INTRAVENOUS
  Filled 2019-07-23 (×2): qty 1500

## 2019-07-23 NOTE — Progress Notes (Signed)
npatient Diabetes Program Recommendations  AACE/ADA: New Consensus Statement on Inpatient Glycemic Control (2015)  Target Ranges:  Prepandial:   less than 140 mg/dL      Peak postprandial:   less than 180 mg/dL (1-2 hours)      Critically ill patients:  140 - 180 mg/dL   Lab Results  Component Value Date   GLUCAP 323 (H) 07/23/2019   HGBA1C 7.8 (H) 07/19/2019    Review of Glycemic Control Results for HIYAB, MALAVE (MRN [id number]) as of 07/23/2019 09:01  Ref. Range 07/22/2019 07:58 07/22/2019 11:22 07/22/2019 16:54 07/22/2019 19:41 07/22/2019 20:34 07/23/2019 00:31 07/23/2019 04:26 07/23/2019 08:02  Glucose-Capillary Latest Ref Range: 70 - 99 mg/dL 264 (H)  Novolog 16 units 373 (H)  Novolog 23 units 168 (H)  Novolog 3 units 150 (H) 125 (H)     Lantus 45 units 340 (H) 370 (H) 323 (H)  Novolog 19 units    Diabetes history: DM 2 Outpatient Diabetes medications: 70/30 70 units tid Current orders for Inpatient glycemic control:  Lantus 45 units qhs Novolog 0-15 units tid Novolog 8 units tid meal coverage  A1c 7.8% on 9/18  Inpatient Diabetes Program Recommendations:    Noted Lantus increased to 55 units. Pt on a large dose of 70/30 tid at home.  Also noted Novolog meal coverage increased to 12 units.   Based on amount of Novolog given and glucose trends, may consider increasing Lantus to 35 units bid. First dose this am.  Thanks,  Tama Headings RN, MSN, BC-ADM Inpatient Diabetes Coordinator Team Pager [phone] (8a-5p)

## 2019-07-23 NOTE — Progress Notes (Signed)
Occupational Therapy Treatment Patient Details Name: Katherine Herrera MRN: [id number] DOB: [birth year] Today's Date: 07/23/2019    History of present illness 53 y/o female with history of type 2 diabetes mellitus, Charcot deformity of right foot status post fifth metatarsal resection recently, hypothyroidism, COPD, chronic pain and hyperlipidemia presented with worsened wound over the right foot metatarsal resection that she had about 2 weeks back.  She was sent home on wound VAC and Augmentin but about 6 days back the wound VAC stopped working.  She was seen in the office by her podiatrist on the day of admission and recommended her to be admitted to the hospital for IV antibiotics and surgical intervention. Pt is now s/p Incision and drainage right foot wound below the deep fascia on 07/20/19.07/22/19 s/p I&D with wound vac placement.   OT comments  This 53 yo female admitted with above presents to acute OT making great progress with mobility and basic ADLs. She will have 24/7 support for a week at home before she will be alone intermittently.   Follow Up Recommendations  Home health OT;Supervision - Intermittent    Equipment Recommendations  None recommended by OT       Precautions / Restrictions Precautions Precautions: Fall Precaution Comments: CAM boot on pt upon OT arrival Required Braces or Orthoses: (cam boot) Restrictions Weight Bearing Restrictions: No RLE Weight Bearing: Weight bearing as tolerated Other Position/Activity Restrictions: WBAT as long as in cam boot per chat text with Dr. March Rummage       Mobility Bed Mobility Overal bed mobility: Modified Independent                Transfers Overall transfer level: Needs assistance Equipment used: Rolling walker (2 wheeled) Transfers: Sit to/from [facility] Sit to Stand: Supervision Stand pivot transfers: Supervision       General transfer comment: min A for ambulation with RW    Balance Overall  balance assessment: Needs assistance Sitting-balance support: No upper extremity supported;Feet supported Sitting balance-Leahy Scale: Good     Standing balance support: No upper extremity supported;During functional activity                               ADL either performed or assessed with clinical judgement   ADL Overall ADL's : Needs assistance/impaired                         Toilet Transfer: Supervision/safety;Squat-pivot;BSC   Toileting- Clothing Manipulation and Hygiene: Supervision/safety;Sit to/from stand Toileting - Clothing Manipulation Details (indicate cue type and reason): at bedside       General ADL Comments: We spoke about if she wants to get a shower she needs to clear it with MD and wound vac person (may have to do it on wound vac change day) but still need to keep leg dry     Vision Patient Visual Report: No change from baseline            Cognition Arousal/Alertness: Awake/alert Behavior During Therapy: WFL for tasks assessed/performed Overall Cognitive Status: Within Functional Limits for tasks assessed                                                     Pertinent Vitals/ Pain  Pain Assessment: Faces Faces Pain Scale: Hurts little more Pain Location: R foot with WB'ing in cam boot Pain Descriptors / Indicators: Grimacing;Operative site guarding Pain Intervention(s): Limited activity within patient's tolerance;Monitored during session;Repositioned;Premedicated before session         Frequency  Min 2X/week        Progress Toward Goals  OT Goals(current goals can now be found in the care plan section)  Progress towards OT goals: Progressing toward goals     Plan Discharge plan remains appropriate       AM-PAC OT "6 Clicks" Daily Activity     Outcome Measure   Help from another person eating meals?: None Help from another person taking care of personal grooming?: A Little Help from  another person toileting, which includes using toliet, bedpan, or urinal?: A Little Help from another person bathing (including washing, rinsing, drying)?: A Little Help from another person to put on and taking off regular upper body clothing?: A Little Help from another person to put on and taking off regular lower body clothing?: A Little 6 Click Score: 19    End of Session Equipment Utilized During Treatment: Rolling walker;Gait belt  OT Visit Diagnosis: Unsteadiness on feet (R26.81);Pain Pain - Right/Left: Right Pain - part of body: Ankle and joints of foot   Activity Tolerance Patient tolerated treatment well   Patient Left in bed;with call bell/phone within reach;with family/visitor present             Time: [id number] OT Time Calculation (min): 44 min  Charges: OT General Charges $OT Visit: 1 Visit OT Treatments $Self Care/Home Management : 38-52 mins  [address]en Circle, OTR/L Acute [employer] Pager [phone] Office [phone]     Almon Register 07/23/2019, 5:16 PM

## 2019-07-23 NOTE — Progress Notes (Signed)
Pharmacy Antibiotic Note  Katherine Herrera is a 53 y.o. female admitted on 07/19/2019 with right diabetic foot ulcer s/p I&D 9/19, 9/21.  Pharmacy has been consulted for Vancomycin and Zosyn dosing.  Today, 07/23/19: SCr improved to 1.23, CrCl ~ 72 mL/min WBC 7.7 (9/21) Afebrile Random Vancomycin level this AM = 11 mcg/mL  Plan: -Resume Vancomycin 1500mg  IV q24h -Vancomycin levels at steady state, as indicated -Continue Zosyn 3.375g IV q8h (each dose infused over 4 hours) -Monitor renal function (daily SCr), cultures, clinical course, duration of therapy    Height: 5\' 9"  (175.3 cm) Weight: 255 lb 8.2 oz (115.9 kg) IBW/kg (Calculated) : 66.2Temp (24hrs), Avg:97.8 F (36.6 C), Min:97.5 F (36.4 C), Max:98.1 F (36.7 C)  Recent Labs  Lab 07/19/19 2320 07/20/19 0451 07/21/19 0518 07/22/19 0438 07/23/19 0421  WBC 11.4* 9.9  --  7.7  --   CREATININE 1.16* 1.11* 1.18* 1.39* 1.23*  VANCORANDOM  --   --   --   --  11    Estimated Creatinine Clearance: 71.9 mL/min (A) (by C-G formula based on SCr of 1.23 mg/dL (H)).    Allergies  Allergen Reactions  . Hydromorphone Hcl Itching    Patient has been tolerating Hydromorphone tablets without adverse effect (07/09/19)  . Cephalexin Nausea And Vomiting  . Demerol  [Meperidine Hcl] Rash  . Lovastatin Other (See Comments)    Other reaction(s): Unknown (See Comments) Possible myalgia Unknown   . Metformin And Related Nausea And Vomiting  . Sulfamethoxazole-Trimethoprim     Other reaction(s): Unknown DILI, pancreatitis  . Crestor [Rosuvastatin Calcium]     myalgia  . Niacin Other (See Comments)    Unknown   . Paroxetine Other (See Comments)    Other reaction(s): Unknown (See Comments) unknown Unknown     Antimicrobials this admission: 9/18 Vancomycin >>  9/18 Zosyn >>   Microbiology results: 9/18 BCx: NGTD  9/18 MRSA PCR: negative 9/18 COVID: negative  9/19 right foot wound: few yeast  Thank you for allowing pharmacy to  be a part of this patient's care.   Lindell Spar, PharmD, BCPS Clinical Pharmacist  07/23/2019, 7:46 AM

## 2019-07-23 NOTE — Progress Notes (Signed)
PROGRESS NOTE                                                                                                                                                                                                             Patient Demographics:    Katherine Herrera, is a 53 y.o. female, DOB - December 06, 1965, [id number]  Admit date - 07/19/2019   Admitting Physician Toy Baker, MD  Outpatient Primary MD for the patient is Plotnikov, [name], MD  LOS - 4  Outpatient Specialists: Podiatry  No chief complaint on file.      Brief Narrative 53 y/o female with history of type 2 diabetes mellitus, Charcot deformity of right foot status post fifth metatarsal resection recently, hypothyroidism, COPD, chronic pain and hyperlipidemia presented with worsened wound over the right foot metatarsal resection that she had about 2 weeks back.  She was sent home on wound VAC and Augmentin but about 6 days back the wound VAC stopped working.  She was seen in the office by her podiatrist on the day of admission and recommended her to be admitted to the hospital for IV antibiotics and surgical intervention. Patient denies any fevers, chills or nausea.  Reports that her CBG has been running in the 50s to 60s last few days despite adequate meal intake.    Subjective:   Right foot pain stable on current pain regimen but reports that it wears off easily.  Blood glucose elevated in the 300s still.   Assessment  & Plan :   Principal problem Right diabetic foot ulcer, with poor healing Continue empiric IV vancomycin and Zosyn.   Underwent I&D of the right foot by podiatry on 9/19, followed by wound VAC placement on 9/21.   Pain control with PRN tramadol, home dose Dilaudid and PRN Toradol, better controlled now. Blood and wound culture without any growth.   Active symptoms Type 2 diabetes mellitus, uncontrolled with hyperglycemia A1c of 7.8.   Still hyperglycemic with blood glucose in the 300s despite increasing Lantus dose (45 units daily and pre-meal aspart 7 units 3 times daily).  Likely caused by her infection.  We switch to Lantus 45 units twice daily.    Acute kidney injury Creatinine improving with IV fluids.  Monitor while patient on PRN Toradol.  Hypothyroidism Continue Synthroid  Diabetic polyneuropathy Continue Neurontin  Tobacco  abuse Counseled on cessation  Chronic pain Home meds continued   Code Status : Full code  Family Communication  : None  Disposition Plan  : Home pending final culture results and further recommendations per podiatry eval.  Barriers For Discharge : Active symptoms  Consults  : Podiatry (Dr. March Rummage)  Procedures  : I&D of right wound  DVT Prophylaxis  :  Lovenox -   Lab Results  Component Value Date   PLT 367 07/22/2019    Antibiotics  :    Anti-infectives (From admission, onward)   Start     Dose/Rate Route Frequency Ordered Stop   07/23/19 0800  vancomycin (VANCOCIN) 1,500 mg in sodium chloride 0.9 % 500 mL IVPB     1,500 mg 250 mL/hr over 120 Minutes Intravenous Daily 07/23/19 0745     07/22/19 1844  vancomycin (VANCOCIN) powder  Status:  Discontinued       As needed 07/22/19 1844 07/22/19 1901   07/22/19 1112  vancomycin variable dose per unstable renal function (pharmacist dosing)  Status:  Discontinued      Does not apply See admin instructions 07/22/19 1113 07/23/19 0745   07/21/19 0300  vancomycin (VANCOCIN) 1,500 mg in sodium chloride 0.9 % 500 mL IVPB  Status:  Discontinued     1,500 mg 250 mL/hr over 120 Minutes Intravenous Daily 07/20/19 0120 07/22/19 1113   07/20/19 0900  piperacillin-tazobactam (ZOSYN) IVPB 3.375 g     3.375 g 12.5 mL/hr over 240 Minutes Intravenous Every 8 hours 07/20/19 0120     07/19/19 2230  vancomycin (VANCOCIN) 2,000 mg in sodium chloride 0.9 % 500 mL IVPB     2,000 mg 250 mL/hr over 120 Minutes Intravenous  Once 07/19/19 2200  07/20/19 0243   07/19/19 2200  piperacillin-tazobactam (ZOSYN) IVPB 3.375 g     3.375 g 100 mL/hr over 30 Minutes Intravenous NOW 07/19/19 2200 07/20/19 0317        Objective:   Vitals:   07/22/19 2016 07/23/19 0030 07/23/19 0429 07/23/19 1438  BP: (!) 143/76 136/75 127/68 (!) 145/85  Pulse: 70 68 69 70  Resp: 19 17 18 16   Temp: 97.7 F (36.5 C) 97.6 F (36.4 C) 97.8 F (36.6 C) 98.4 F (36.9 C)  TempSrc: Oral Oral Oral Oral  SpO2: 99% 95% 90% 98%  Weight: 115.9 kg     Height: 5\' 9"  (1.753 m)       Wt Readings from Last 3 Encounters:  07/22/19 115.9 kg  07/05/19 103.8 kg  07/01/19 103.9 kg     Intake/Output Summary (Last 24 hours) at 07/23/2019 1450 Last data filed at 07/23/2019 1346 Gross per 24 hour  Intake 3295.84 ml  Output 10 ml  Net 3285.84 ml   Physical exam Not in distress HEENT: Moist mucosa, supple neck Chest: Clear CVs: Normal S1-S2 GI: Soft, nontender, nondistended Musculoskeletal: Dressing over right foot with wound VAC, no edema, minimal tenderness      Data Review:    CBC Recent Labs  Lab 07/19/19 2320 07/20/19 0451 07/22/19 0438  WBC 11.4* 9.9 7.7  HGB 13.6 12.1 11.7*  HCT 43.1 38.7 37.5  PLT 492* 448* 367  MCV 88.3 88.2 88.2  MCH 27.9 27.6 27.5  MCHC 31.6 31.3 31.2  RDW 13.4 13.3 12.7  LYMPHSABS 3.9  --   --   MONOABS 0.7  --   --   EOSABS 0.4  --   --   BASOSABS 0.1  --   --  Chemistries  Recent Labs  Lab 07/19/19 2320 07/20/19 0451 07/21/19 0518 07/22/19 0438 07/23/19 0421  NA 139 138  --  134*  --   K 4.2 4.5  --  4.6  --   CL 98 102  --  97*  --   CO2 29 26  --  28  --   GLUCOSE 260* 164*  --  384*  --   BUN 19 23*  --  28*  --   CREATININE 1.16* 1.11* 1.18* 1.39* 1.23*  CALCIUM 9.1 8.8*  --  8.6*  --   MG  --  2.1  --   --   --   AST 13* 11*  --   --   --   ALT 11 10  --   --   --   ALKPHOS 112 95  --   --   --   BILITOT 0.2* 0.4  --   --   --     ------------------------------------------------------------------------------------------------------------------ No results for input(s): CHOL, HDL, LDLCALC, TRIG, CHOLHDL, LDLDIRECT in the last 72 hours.  Lab Results  Component Value Date   HGBA1C 7.8 (H) 07/19/2019   ------------------------------------------------------------------------------------------------------------------ No results for input(s): TSH, T4TOTAL, T3FREE, THYROIDAB in the last 72 hours.  Invalid input(s): FREET3 ------------------------------------------------------------------------------------------------------------------ No results for input(s): VITAMINB12, FOLATE, FERRITIN, TIBC, IRON, RETICCTPCT in the last 72 hours.  Coagulation profile Recent Labs  Lab 07/19/19 2320  INR 1.0    No results for input(s): DDIMER in the last 72 hours.  Cardiac Enzymes No results for input(s): CKMB, TROPONINI, MYOGLOBIN in the last 168 hours.  Invalid input(s): CK ------------------------------------------------------------------------------------------------------------------    Component Value Date/Time   BNP 58.0 09/28/2018 2112    Inpatient Medications  Scheduled Meds:  baclofen  20 mg Oral BID   ezetimibe  10 mg Oral Daily   gabapentin  800 mg Oral QID   insulin aspart  0-15 Units Subcutaneous TID WC   insulin aspart  12 Units Subcutaneous TID WC   insulin glargine  35 Units Subcutaneous BID   lamoTRIgine  25 mg Oral BID   levothyroxine  75 mcg Oral Daily   nutrition supplement (JUVEN)  1 packet Oral BID BM   rOPINIRole  0.5 mg Oral QHS   senna  1 tablet Oral BID   sodium chloride flush  3 mL Intravenous Q12H   Continuous Infusions:  sodium chloride Stopped (07/22/19 1512)   piperacillin-tazobactam (ZOSYN)  IV 3.375 g (07/23/19 1346)   vancomycin 1,500 mg (07/23/19 1025)   PRN Meds:.sodium chloride, acetaminophen **OR** acetaminophen, clonazePAM, HYDROmorphone, ketorolac,  loratadine, ondansetron **OR** ondansetron (ZOFRAN) IV, polyethylene glycol, sodium chloride flush, sodium chloride flush, traMADol  Micro Results Recent Results (from the past 240 hour(s))  SARS CORONAVIRUS 2 (TAT 6-24 HRS) Nasopharyngeal Nasopharyngeal Swab     Status: None   Collection Time: 07/19/19  9:43 PM   Specimen: Nasopharyngeal Swab  Result Value Ref Range Status   SARS Coronavirus 2 NEGATIVE NEGATIVE Final    Comment: (NOTE) SARS-CoV-2 target nucleic acids are NOT DETECTED. The SARS-CoV-2 RNA is generally detectable in upper and lower respiratory specimens during the acute phase of infection. Negative results do not preclude SARS-CoV-2 infection, do not rule out co-infections with other pathogens, and should not be used as the sole basis for treatment or other patient management decisions. Negative results must be combined with clinical observations, patient history, and epidemiological information. The expected result is Negative. Fact Sheet for Patients: SugarRoll.be Fact Sheet for Healthcare  Providers: https://www.woods-mathews.com/ This test is not yet approved or cleared by the [place] and  has been authorized for detection and/or diagnosis of SARS-CoV-2 by FDA under an Emergency Use Authorization (EUA). This EUA will remain  in effect (meaning this test can be used) for the duration of the COVID-19 declaration under Section 56 4(b)(1) of the Act, 21 U.S.C. section 360bbb-3(b)(1), unless the authorization is terminated or revoked sooner. Performed at [facility], [place] [address] [place], [place] [address]   MRSA PCR Screening     Status: None   Collection Time: 07/19/19  9:54 PM   Specimen: Nasal Mucosa; Nasopharyngeal  Result Value Ref Range Status   MRSA by PCR NEGATIVE NEGATIVE Final    Comment:        The GeneXpert MRSA Assay (FDA approved for NASAL specimens only), is one component of  a comprehensive MRSA colonization surveillance program. It is not intended to diagnose MRSA infection nor to guide or monitor treatment for MRSA infections. Performed at [facility], [place] [address] [place], [place] [address]   Blood Cultures x 2 sites     Status: None (Preliminary result)   Collection Time: 07/19/19 11:20 PM   Specimen: BLOOD RIGHT FOREARM  Result Value Ref Range Status   Specimen Description BLOOD RIGHT FOREARM  Final   Special Requests   Final    BOTTLES DRAWN AEROBIC ONLY Blood Culture results may not be optimal due to an excessive volume of blood received in culture bottles   Culture   Final    NO GROWTH 3 DAYS Performed at [facility], [place] [address], [place], [place] [address]    Report Status PENDING  Incomplete  Blood Cultures x 2 sites     Status: None (Preliminary result)   Collection Time: 07/19/19 11:20 PM   Specimen: BLOOD  Result Value Ref Range Status   Specimen Description   Final    BLOOD RIGHT ANTECUBITAL Performed at [place] [address], [place], [place] [address]    Special Requests   Final    BOTTLES DRAWN AEROBIC ONLY Blood Culture results may not be optimal due to an excessive volume of blood received in culture bottles   Culture   Final    NO GROWTH 3 DAYS Performed at [facility], [place] [address] [place], [place] [address]    Report Status PENDING  Incomplete  Aerobic/Anaerobic Culture (surgical/deep wound)     Status: None (Preliminary result)   Collection Time: 07/20/19  2:40 PM   Specimen: PATH Other; Tissue  Result Value Ref Range Status   Specimen Description   Final    WOUND RIGHT FOOT Performed at [facility], [place] [address], [place], [place] [address]    Special Requests   Final    NONE Performed at [facility], [place] [address] [place], [place] [address]    Gram Stain   Final    RARE WBC PRESENT, PREDOMINANTLY PMN NO ORGANISMS  SEEN Performed at [facility], [place] [address] [place],  [address]    Culture   Final    FEW YEAST NO ANAEROBES ISOLATED; CULTURE IN PROGRESS FOR 5 DAYS    Report Status PENDING  Incomplete    Radiology Reports Mr Foot Right Wo Contrast  Result Date: 07/05/2019 CLINICAL DATA:  Diabetic with right foot ulcer and cellulitis. Limited response to antibiotics. Clinical concern of osteomyelitis. EXAM: MRI  OF THE RIGHT FOREFOOT WITHOUT CONTRAST TECHNIQUE: Multiplanar, multisequence MR imaging of the right forefoot was performed. No intravenous contrast was administered. COMPARISON:  Radiographs 06/20/2019, 04/02/2019 and 01/02/2019. FINDINGS: Bones/Joint/Cartilage Patient underwent resection of the 5th metatarsal head on 01/02/2019. The amputation site appears sharp. There is periosteal thickening involving the base of the 5th metatarsal which appears chronic. Low-level marrow T2 hyperintensity is present within the base of the 5th metatarsal without corresponding T1 signal abnormality or cortical destruction. There are overlying soft tissue changes with inflammation and possible soft tissue emphysema in this area. There is mild T2 hyperintensity laterally in the cuboid without corresponding T1 signal abnormality or cortical destruction. Apparent arthropathy at the 2nd DIP joint. No other focal osseous lesions are identified. There are mild degenerative changes in the midfoot. Ligaments Intact Lisfranc ligament. Muscles and Tendons Diffuse forefoot muscular atrophy without focal fluid collection. No significant tenosynovitis. Soft tissues There is skin irregularity and ulceration along the plantar and lateral aspect of the 5th metatarsal base. There are inflammatory changes and possible soft tissue emphysema in the subcutaneous fat. There is no well-defined fluid collection. As noted above, the soft tissue changes are adjacent to marrow changes in the base of the 5th metatarsal. IMPRESSION: 1.  Soft tissue ulceration along the plantar and lateral aspect of the 5th metatarsal base with underlying inflammatory changes and possible soft tissue emphysema. These findings are in close proximity to T2 marrow changes in the base of the 5th metatarsal, suspicious for early osteomyelitis. There is no cortical destruction. 2. 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 3. Apparent arthropathy at the 2nd DIP joint. Electronically Signed   By: Richardean Sale M.D.   On: 07/05/2019 12:46   Dg Foot 2 Views Right  Result Date: 07/05/2019 CLINICAL DATA:  Status post right fifth metatarsal resection, peroneal tenodesis, ulcer repair EXAM: RIGHT FOOT - 2 VIEW COMPARISON:  06/24/2019 FINDINGS: Redemonstrated postoperative findings of distal right fifth metatarsal amputation with new overlying skin staples about the base of the right fifth metatarsal. There is soft tissue edema about the midfoot and ankle. Mild right first metatarsophalangeal arthrosis. IMPRESSION: Redemonstrated postoperative findings of distal right fifth metatarsal amputation with new overlying skin staples about the base of the right fifth metatarsal. There is soft tissue edema about the midfoot and ankle. Electronically Signed   By: Eddie Candle M.D.   On: 07/05/2019 16:45   Dg Foot Complete Right  Result Date: 07/22/2019 Please see detailed radiograph report in office note.  Dg Foot Complete Right  Result Date: 07/01/2019 Please see detailed radiograph report in office note.  Vas [place] Burnard Bunting With/wo Tbi  Result Date: 07/21/2019 LOWER EXTREMITY DOPPLER STUDY Indications: Ulceration. High Risk Factors: Diabetes. Other Factors: Post op foot infection worsening even with antibiotics. Admitted                for IV antibiotics and I&D of foot.  Vascular Interventions: 07/05/19 right 5th metatarsal resection. Comparison Study: 09/24/12 WNL Performing Technologist: June Leap Rdms, Rvt  Examination Guidelines: A complete evaluation includes at  minimum, Doppler waveform signals and systolic blood pressure reading at the level of bilateral brachial, anterior tibial, and posterior tibial arteries, when vessel segments are accessible. Bilateral testing is considered an integral part of a complete examination. Photoelectric Plethysmograph (PPG) waveforms and toe systolic pressure readings are included as required and additional duplex testing as needed. Limited examinations for reoccurring indications may be performed as noted.  ABI Findings: +---------+------------------+-----+---------+-------------------------+  Right  Rt Pressure (mmHg) Index Waveform  Comment                    +---------+------------------+-----+---------+-------------------------+  Brachial                           triphasic BP not obtained due to IV  +---------+------------------+-----+---------+-------------------------+  ATA       148                1.33  biphasic                             +---------+------------------+-----+---------+-------------------------+  PTA       142                1.28  triphasic                            +---------+------------------+-----+---------+-------------------------+  Great Toe 142                1.28  Abnormal                             +---------+------------------+-----+---------+-------------------------+ +---------+------------------+-----+---------+-------+  Left      Lt Pressure (mmHg) Index Waveform  Comment  +---------+------------------+-----+---------+-------+  Brachial  111                      triphasic          +---------+------------------+-----+---------+-------+  ATA       146                1.32  triphasic          +---------+------------------+-----+---------+-------+  PTA       155                1.40  biphasic           +---------+------------------+-----+---------+-------+  Great Toe 141                1.27  Abnormal           +---------+------------------+-----+---------+-------+  Summary: Right: Resting right  ankle-brachial index indicates noncompressible right lower extremity arteries. The right toe-brachial index is normal. Left: Resting left ankle-brachial index indicates noncompressible left lower extremity arteries. The left toe-brachial index is normal.  *See table(s) above for measurements and observations.  Electronically signed by Ruta Hinds MD on 07/21/2019 at 11:00:34 AM.    Final    Vas [place] Lower Extremity Venous (dvt)  Result Date: 07/08/2019  Lower Venous Study Indications: Pain. Other Indications: Diabetic ulcer with necrosis of bone, osteomyelitis. Limitations: Body habitus, edema, poor ultrasound/tissue interface and orthopaedic appliance. Comparison Study: Prior negative left lower extremity venous duplex from 05/12/17                   is available for comparison. Performing Technologist: Sharion Dove RVS  Examination Guidelines: A complete evaluation includes B-mode imaging, spectral Doppler, color Doppler, and power Doppler as needed of all accessible portions of each vessel. Bilateral testing is considered an integral part of a complete examination. Limited examinations for reoccurring indications may be performed as noted.  +---------+---------------+---------+-----------+----------+------------------+  RIGHT     Compressibility Phasicity Spontaneity Properties Thrombus Aging      +---------+---------------+---------+-----------+----------+------------------+  CFV       Full  Yes       Yes                                        +---------+---------------+---------+-----------+----------+------------------+  SFJ       Full                                                                 +---------+---------------+---------+-----------+----------+------------------+  FV Prox   Full                                                                 +---------+---------------+---------+-----------+----------+------------------+  FV Mid    Full                                                                  +---------+---------------+---------+-----------+----------+------------------+  FV Distal Full                                                                 +---------+---------------+---------+-----------+----------+------------------+  PFV       Full                                                                 +---------+---------------+---------+-----------+----------+------------------+  POP                       Yes       Yes                    Patent by color                                                                 Doppler             +---------+---------------+---------+-----------+----------+------------------+  PTV                                                        Not visualized      +---------+---------------+---------+-----------+----------+------------------+  PERO                                                       Not visualized      +---------+---------------+---------+-----------+----------+------------------+   +----+---------------+---------+-----------+----------+--------------+  LEFT Compressibility Phasicity Spontaneity Properties Thrombus Aging  +----+---------------+---------+-----------+----------+--------------+  CFV  Full            Yes       Yes                                    +----+---------------+---------+-----------+----------+--------------+     Summary: Right: There is no evidence of deep vein thrombosis in the lower extremity. However, portions of this examination were limited- see technologist comments above. Left: No evidence of common femoral vein obstruction.  *See table(s) above for measurements and observations. Electronically signed by Harold Barban MD on 07/08/2019 at 1:11:38 AM.    Final     Time Spent in minutes 35   Masato Pettie M.D on 07/23/2019 at 2:50 PM  Between 7am to 7pm - Pager - [phone]  After 7pm go to [email or web address] - password [facility]  [facility] -  Office  [phone]

## 2019-07-23 NOTE — Progress Notes (Signed)
Subjective:  Patient ID: Katherine Herrera, female    DOB: [birth year],  MRN: [id number]  Seen bedside. Pain improving. Worked with OT today. [place] on, no issues. Objective:   Vitals:   07/23/19 1438 07/23/19 2128  BP: (!) 145/85 (!) 159/85  Pulse: 70 68  Resp: 16 20  Temp: 98.4 F (36.9 C) 98.2 F (36.8 C)  SpO2: 98% 93%   General AA&O x3. Normal mood and affect.  Vascular Dorsalis pedis and posterior tibial pulses 2/4 bilat. Brisk capillary refill to all digits. Pedal hair present.  Neurologic Epicritic sensation grossly diminished.  Dermatologic R foot dressing c/d/i. WVAc on and functioning minimal drainage in cannister.  Orthopedic: MMT 5/5 in dorsiflexion, plantarflexion, inversion, and eversion. Normal joint ROM without pain or crepitus.   Results for orders placed or performed during the hospital encounter of 07/19/19 (from the past 24 hour(s))  Glucose, capillary     Status: Abnormal   Collection Time: 07/23/19 12:31 AM  Result Value Ref Range   Glucose-Capillary 340 (H) 70 - 99 mg/dL  Creatinine, serum     Status: Abnormal   Collection Time: 07/23/19  4:21 AM  Result Value Ref Range   Creatinine, Ser 1.23 (H) 0.44 - 1.00 mg/dL   GFR calc non Af Amer 50 (L) >60 mL/min   GFR calc Af Amer 58 (L) >60 mL/min  Vancomycin, random     Status: None   Collection Time: 07/23/19  4:21 AM  Result Value Ref Range   Vancomycin Rm 11   Glucose, capillary     Status: Abnormal   Collection Time: 07/23/19  4:26 AM  Result Value Ref Range   Glucose-Capillary 370 (H) 70 - 99 mg/dL  Glucose, capillary     Status: Abnormal   Collection Time: 07/23/19  8:02 AM  Result Value Ref Range   Glucose-Capillary 323 (H) 70 - 99 mg/dL  Glucose, capillary     Status: Abnormal   Collection Time: 07/23/19 12:33 PM  Result Value Ref Range   Glucose-Capillary 263 (H) 70 - 99 mg/dL  Glucose, capillary     Status: Abnormal   Collection Time: 07/23/19  4:41 PM  Result Value Ref Range   Glucose-Capillary 191 (H) 70 - 99 mg/dL  Glucose, capillary     Status: Abnormal   Collection Time: 07/23/19  9:26 PM  Result Value Ref Range   Glucose-Capillary 196 (H) 70 - 99 mg/dL    Results for orders placed or performed during the hospital encounter of 07/19/19  SARS CORONAVIRUS 2 (TAT 6-24 HRS) Nasopharyngeal Nasopharyngeal Swab     Status: None   Collection Time: 07/19/19  9:43 PM   Specimen: Nasopharyngeal Swab  Result Value Ref Range Status   SARS Coronavirus 2 NEGATIVE NEGATIVE Final    Comment: (NOTE) SARS-CoV-2 target nucleic acids are NOT DETECTED. The SARS-CoV-2 RNA is generally detectable in upper and lower respiratory specimens during the acute phase of infection. Negative results do not preclude SARS-CoV-2 infection, do not rule out co-infections with other pathogens, and should not be used as the sole basis for treatment or other patient management decisions. Negative results must be combined with clinical observations, patient history, and epidemiological information. The expected result is Negative. Fact Sheet for Patients: SugarRoll.be Fact Sheet for Healthcare Providers: https://www.woods-mathews.com/ This test is not yet approved or cleared by the [place] FDA and  has been authorized for detection and/or diagnosis of SARS-CoV-2 by FDA under an Emergency Use Authorization (EUA). This EUA will remain  in effect (meaning this test can be used) for the duration of the COVID-19 declaration under Section 56 4(b)(1) of the Act, 21 U.S.C. section 360bbb-3(b)(1), unless the authorization is terminated or revoked sooner. Performed at [facility], [place] [address], [place], [place] [address]   MRSA PCR Screening     Status: None   Collection Time: 07/19/19  9:54 PM   Specimen: Nasal Mucosa; Nasopharyngeal  Result Value Ref Range Status   MRSA by PCR NEGATIVE NEGATIVE Final    Comment:        The GeneXpert MRSA  Assay (FDA approved for NASAL specimens only), is one component of a comprehensive MRSA colonization surveillance program. It is not intended to diagnose MRSA infection nor to guide or monitor treatment for MRSA infections. Performed at [facility], [place] [address], [place], [place] [address]   Blood Cultures x 2 sites     Status: None (Preliminary result)   Collection Time: 07/19/19 11:20 PM   Specimen: BLOOD RIGHT FOREARM  Result Value Ref Range Status   Specimen Description BLOOD RIGHT FOREARM  Final   Special Requests   Final    BOTTLES DRAWN AEROBIC ONLY Blood Culture results may not be optimal due to an excessive volume of blood received in culture bottles   Culture   Final    NO GROWTH 3 DAYS Performed at [facility], [place] [address] [place], [place] [address]    Report Status PENDING  Incomplete  Blood Cultures x 2 sites     Status: None (Preliminary result)   Collection Time: 07/19/19 11:20 PM   Specimen: BLOOD  Result Value Ref Range Status   Specimen Description   Final    BLOOD RIGHT ANTECUBITAL Performed at [place] [address], [place], [place] [address]    Special Requests   Final    BOTTLES DRAWN AEROBIC ONLY Blood Culture results may not be optimal due to an excessive volume of blood received in culture bottles   Culture   Final    NO GROWTH 3 DAYS Performed at [facility], [place] [address], [place], [place] [address]    Report Status PENDING  Incomplete  Aerobic/Anaerobic Culture (surgical/deep wound)     Status: None (Preliminary result)   Collection Time: 07/20/19  2:40 PM   Specimen: PATH Other; Tissue  Result Value Ref Range Status   Specimen Description   Final    WOUND RIGHT FOOT Performed at [facility], [place] [address] [place], [place] [address]    Special Requests   Final    NONE Performed at [facility], [place] [address] [place], [place] [address]    Gram  Stain   Final    RARE WBC PRESENT, PREDOMINANTLY PMN NO ORGANISMS SEEN Performed at [facility], [place] [address], [place],  [address]    Culture   Final    FEW YEAST NO ANAEROBES ISOLATED; CULTURE IN PROGRESS FOR 5 DAYS    Report Status PENDING  Incomplete    Assessment & Plan:  Patient was evaluated and treated and all questions answered.  Right Foot Cellulitis with wound dehiscence s/p Debridement and irrigation -Continue abx. Cultures growing yeast. Recc add on coverage for yeast, consider fluconazole. -Continue pain meds PRN -OR tomorrow for repeat debridement, wound VAC application -NPO midnight.  Will follow post-op.  Evelina Bucy, DPM  Accessible via secure chat for questions or concerns.

## 2019-07-24 ENCOUNTER — Inpatient Hospital Stay (HOSPITAL_COMMUNITY): Payer: Medicare Other | Admitting: Certified Registered"

## 2019-07-24 ENCOUNTER — Encounter (HOSPITAL_COMMUNITY)
Admission: AD | Disposition: A | Discharge status: Home / Self Care | Payer: Self-pay | Source: Ambulatory Visit | Attending: Internal Medicine

## 2019-07-24 ENCOUNTER — Encounter (HOSPITAL_COMMUNITY): Payer: Self-pay

## 2019-07-24 DIAGNOSIS — E669 Obesity, unspecified: Secondary | ICD-10-CM

## 2019-07-24 DIAGNOSIS — E1121 Type 2 diabetes mellitus with diabetic nephropathy: Secondary | ICD-10-CM

## 2019-07-24 DIAGNOSIS — M109 Gout, unspecified: Secondary | ICD-10-CM

## 2019-07-24 DIAGNOSIS — L570 Actinic keratosis: Secondary | ICD-10-CM

## 2019-07-24 DIAGNOSIS — G5750 Tarsal tunnel syndrome, unspecified lower limb: Secondary | ICD-10-CM

## 2019-07-24 DIAGNOSIS — K5903 Drug induced constipation: Secondary | ICD-10-CM

## 2019-07-24 DIAGNOSIS — E871 Hypo-osmolality and hyponatremia: Secondary | ICD-10-CM

## 2019-07-24 DIAGNOSIS — N179 Acute kidney failure, unspecified: Secondary | ICD-10-CM

## 2019-07-24 DIAGNOSIS — Z7982 Long term (current) use of aspirin: Secondary | ICD-10-CM

## 2019-07-24 DIAGNOSIS — J449 Chronic obstructive pulmonary disease, unspecified: Secondary | ICD-10-CM

## 2019-07-24 DIAGNOSIS — E1165 Type 2 diabetes mellitus with hyperglycemia: Secondary | ICD-10-CM

## 2019-07-24 DIAGNOSIS — E11610 Type 2 diabetes mellitus with diabetic neuropathic arthropathy: Secondary | ICD-10-CM

## 2019-07-24 DIAGNOSIS — F411 Generalized anxiety disorder: Secondary | ICD-10-CM

## 2019-07-24 DIAGNOSIS — M79609 Pain in unspecified limb: Secondary | ICD-10-CM

## 2019-07-24 DIAGNOSIS — J309 Allergic rhinitis, unspecified: Secondary | ICD-10-CM

## 2019-07-24 DIAGNOSIS — Z794 Long term (current) use of insulin: Secondary | ICD-10-CM | POA: Diagnosis not present

## 2019-07-24 DIAGNOSIS — M25529 Pain in unspecified elbow: Secondary | ICD-10-CM

## 2019-07-24 DIAGNOSIS — G2581 Restless legs syndrome: Secondary | ICD-10-CM

## 2019-07-24 DIAGNOSIS — M7751 Other enthesopathy of right foot: Secondary | ICD-10-CM

## 2019-07-24 DIAGNOSIS — E1142 Type 2 diabetes mellitus with diabetic polyneuropathy: Secondary | ICD-10-CM | POA: Diagnosis not present

## 2019-07-24 DIAGNOSIS — M7989 Other specified soft tissue disorders: Secondary | ICD-10-CM

## 2019-07-24 DIAGNOSIS — Z4781 Encounter for orthopedic aftercare following surgical amputation: Secondary | ICD-10-CM | POA: Diagnosis not present

## 2019-07-24 DIAGNOSIS — G8929 Other chronic pain: Secondary | ICD-10-CM

## 2019-07-24 DIAGNOSIS — E11621 Type 2 diabetes mellitus with foot ulcer: Secondary | ICD-10-CM | POA: Diagnosis not present

## 2019-07-24 DIAGNOSIS — M898X7 Other specified disorders of bone, ankle and foot: Secondary | ICD-10-CM

## 2019-07-24 DIAGNOSIS — H6120 Impacted cerumen, unspecified ear: Secondary | ICD-10-CM

## 2019-07-24 DIAGNOSIS — H9201 Otalgia, right ear: Secondary | ICD-10-CM

## 2019-07-24 DIAGNOSIS — E785 Hyperlipidemia, unspecified: Secondary | ICD-10-CM | POA: Diagnosis not present

## 2019-07-24 DIAGNOSIS — F17210 Nicotine dependence, cigarettes, uncomplicated: Secondary | ICD-10-CM

## 2019-07-24 DIAGNOSIS — R000 Tachycardia, unspecified: Secondary | ICD-10-CM

## 2019-07-24 DIAGNOSIS — E538 Deficiency of other specified B group vitamins: Secondary | ICD-10-CM

## 2019-07-24 DIAGNOSIS — M21962 Unspecified acquired deformity of left lower leg: Secondary | ICD-10-CM

## 2019-07-24 DIAGNOSIS — M19071 Primary osteoarthritis, right ankle and foot: Secondary | ICD-10-CM

## 2019-07-24 DIAGNOSIS — E039 Hypothyroidism, unspecified: Secondary | ICD-10-CM

## 2019-07-24 DIAGNOSIS — L600 Ingrowing nail: Secondary | ICD-10-CM

## 2019-07-24 DIAGNOSIS — L03115 Cellulitis of right lower limb: Secondary | ICD-10-CM | POA: Diagnosis not present

## 2019-07-24 DIAGNOSIS — M26629 Arthralgia of temporomandibular joint, unspecified side: Secondary | ICD-10-CM

## 2019-07-24 DIAGNOSIS — G40909 Epilepsy, unspecified, not intractable, without status epilepticus: Secondary | ICD-10-CM

## 2019-07-24 DIAGNOSIS — L97519 Non-pressure chronic ulcer of other part of right foot with unspecified severity: Secondary | ICD-10-CM | POA: Diagnosis not present

## 2019-07-24 DIAGNOSIS — I119 Hypertensive heart disease without heart failure: Secondary | ICD-10-CM

## 2019-07-24 DIAGNOSIS — M545 Low back pain: Secondary | ICD-10-CM

## 2019-07-24 DIAGNOSIS — K529 Noninfective gastroenteritis and colitis, unspecified: Secondary | ICD-10-CM

## 2019-07-24 DIAGNOSIS — M659 Synovitis and tenosynovitis, unspecified: Secondary | ICD-10-CM

## 2019-07-24 DIAGNOSIS — L97518 Non-pressure chronic ulcer of other part of right foot with other specified severity: Secondary | ICD-10-CM | POA: Diagnosis not present

## 2019-07-24 HISTORY — PX: INCISION AND DRAINAGE OF WOUND: SHX1803

## 2019-07-24 LAB — GLUCOSE, CAPILLARY
Glucose-Capillary: 69 mg/dL — ABNORMAL LOW (ref 70–99)
Glucose-Capillary: 109 mg/dL — ABNORMAL HIGH (ref 70–99)
Glucose-Capillary: 90 mg/dL (ref 70–99)
Glucose-Capillary: 82 mg/dL (ref 70–99)
Glucose-Capillary: 65 mg/dL — ABNORMAL LOW (ref 70–99)
Glucose-Capillary: 66 mg/dL — ABNORMAL LOW (ref 70–99)
Glucose-Capillary: 67 mg/dL — ABNORMAL LOW (ref 70–99)
Glucose-Capillary: 102 mg/dL — ABNORMAL HIGH (ref 70–99)
Glucose-Capillary: 62 mg/dL — ABNORMAL LOW (ref 70–99)

## 2019-07-24 LAB — CREATININE, SERUM
Creatinine, Ser: 1.32 mg/dL — ABNORMAL HIGH (ref 0.44–1.00)
GFR calc Af Amer: 53 mL/min — ABNORMAL LOW (ref >60)
GFR calc non Af Amer: 46 mL/min — ABNORMAL LOW (ref >60)

## 2019-07-24 SURGERY — IRRIGATION AND DEBRIDEMENT WOUND
Anesthesia: Monitor Anesthesia Care | Site: Foot | Laterality: Right

## 2019-07-24 MED ORDER — INSULIN GLARGINE 100 UNIT/ML ~~LOC~~ SOLN
32.0000 [IU] | Freq: Two times a day (BID) | SUBCUTANEOUS | Status: DC
  Administered 2019-07-24: 32 [IU] via SUBCUTANEOUS
  Filled 2019-07-24 (×3): qty 0.32

## 2019-07-24 MED ORDER — VANCOMYCIN HCL 1000 MG IV SOLR
INTRAVENOUS | Status: DC | PRN
  Administered 2019-07-24: 1000 mg

## 2019-07-24 MED ORDER — DEXTROSE 50 % IV SOLN
1.0000 | Freq: Once | INTRAVENOUS | Status: AC
  Administered 2019-07-24: 50 mL via INTRAVENOUS
  Filled 2019-07-24: qty 50

## 2019-07-24 MED ORDER — LACTATED RINGERS IV SOLN
INTRAVENOUS | Status: DC
  Administered 2019-07-24: 13:00:00 via INTRAVENOUS

## 2019-07-24 MED ORDER — MIDAZOLAM HCL 2 MG/2ML IJ SOLN
INTRAMUSCULAR | Status: AC
  Filled 2019-07-24: qty 2

## 2019-07-24 MED ORDER — FENTANYL CITRATE (PF) 100 MCG/2ML IJ SOLN
25.0000 ug | INTRAMUSCULAR | Status: DC | PRN
  Administered 2019-07-24: 50 ug via INTRAVENOUS

## 2019-07-24 MED ORDER — ENSURE PRE-SURGERY PO LIQD
296.0000 mL | Freq: Once | ORAL | Status: AC
  Administered 2019-07-24: 296 mL via ORAL
  Filled 2019-07-24: qty 296

## 2019-07-24 MED ORDER — FENTANYL CITRATE (PF) 100 MCG/2ML IJ SOLN
INTRAMUSCULAR | Status: AC
  Filled 2019-07-24: qty 2

## 2019-07-24 MED ORDER — ONDANSETRON HCL 4 MG/2ML IJ SOLN
INTRAMUSCULAR | Status: DC | PRN
  Administered 2019-07-24: 4 mg via INTRAVENOUS

## 2019-07-24 MED ORDER — PHENYLEPHRINE 40 MCG/ML (10ML) SYRINGE FOR IV PUSH (FOR BLOOD PRESSURE SUPPORT)
PREFILLED_SYRINGE | INTRAVENOUS | Status: AC
  Filled 2019-07-24: qty 10

## 2019-07-24 MED ORDER — FENTANYL CITRATE (PF) 100 MCG/2ML IJ SOLN
INTRAMUSCULAR | Status: DC | PRN
  Administered 2019-07-24: 50 ug via INTRAVENOUS

## 2019-07-24 MED ORDER — SODIUM CHLORIDE 0.9 % IV SOLN: INTRAVENOUS | Status: DC

## 2019-07-24 MED ORDER — ACETAMINOPHEN 500 MG PO TABS
1000.0000 mg | ORAL_TABLET | Freq: Once | ORAL | Status: AC
  Administered 2019-07-24: 1000 mg via ORAL
  Filled 2019-07-24: qty 2

## 2019-07-24 MED ORDER — BUPIVACAINE HCL 0.5 % IJ SOLN
INTRAMUSCULAR | Status: DC | PRN
  Administered 2019-07-24: 10 mL

## 2019-07-24 MED ORDER — PROPOFOL 500 MG/50ML IV EMUL
INTRAVENOUS | Status: DC | PRN
  Administered 2019-07-24: 100 ug/kg/min via INTRAVENOUS

## 2019-07-24 MED ORDER — LIP MEDEX EX OINT
TOPICAL_OINTMENT | CUTANEOUS | Status: AC
  Administered 2019-07-24: 11:00:00
  Filled 2019-07-24: qty 7

## 2019-07-24 MED ORDER — FLUCONAZOLE 200 MG PO TABS
200.0000 mg | ORAL_TABLET | Freq: Every day | ORAL | Status: DC
  Administered 2019-07-25: 200 mg via ORAL
  Filled 2019-07-24: qty 1

## 2019-07-24 MED ORDER — PROPOFOL 500 MG/50ML IV EMUL
INTRAVENOUS | Status: DC | PRN
  Administered 2019-07-24: 30 mg via INTRAVENOUS

## 2019-07-24 MED ORDER — BUPIVACAINE HCL (PF) 0.5 % IJ SOLN
INTRAMUSCULAR | Status: AC
  Filled 2019-07-24: qty 30

## 2019-07-24 MED ORDER — VANCOMYCIN HCL 1000 MG IV SOLR
INTRAVENOUS | Status: AC
  Filled 2019-07-24: qty 1000

## 2019-07-24 MED ORDER — ONDANSETRON HCL 4 MG/2ML IJ SOLN: 4.0000 mg | Freq: Once | INTRAMUSCULAR | Status: DC | PRN

## 2019-07-24 MED ORDER — SODIUM CHLORIDE 0.9 % IR SOLN
Status: DC | PRN
  Administered 2019-07-24: 3000 mL

## 2019-07-24 MED ORDER — FLUCONAZOLE IN SODIUM CHLORIDE 400-0.9 MG/200ML-% IV SOLN
400.0000 mg | INTRAVENOUS | Status: DC
  Administered 2019-07-24: 400 mg via INTRAVENOUS
  Filled 2019-07-24: qty 200

## 2019-07-24 MED ORDER — 0.9 % SODIUM CHLORIDE (POUR BTL) OPTIME
TOPICAL | Status: DC | PRN
  Administered 2019-07-24: 1000 mL

## 2019-07-24 MED ORDER — PROPOFOL 10 MG/ML IV BOLUS
INTRAVENOUS | Status: AC
  Filled 2019-07-24: qty 20

## 2019-07-24 MED ORDER — MIDAZOLAM HCL 5 MG/5ML IJ SOLN
INTRAMUSCULAR | Status: DC | PRN
  Administered 2019-07-24: 2 mg via INTRAVENOUS

## 2019-07-24 MED ORDER — LIDOCAINE HCL (CARDIAC) PF 100 MG/5ML IV SOSY
PREFILLED_SYRINGE | INTRAVENOUS | Status: DC | PRN
  Administered 2019-07-24: 60 mg via INTRATRACHEAL

## 2019-07-24 SURGICAL SUPPLY — 35 items
BLADE HEX COATED 2.75 (ELECTRODE) ×3 IMPLANT
BLADE SURG 15 STRL LF DISP TIS (BLADE) ×1 IMPLANT
BLADE SURG 15 STRL SS (BLADE) ×3
BNDG ELASTIC 6X5.8 VLCR STR LF (GAUZE/BANDAGES/DRESSINGS) ×2 IMPLANT
BNDG GAUZE ELAST 4 BULKY (GAUZE/BANDAGES/DRESSINGS) ×2 IMPLANT
COVER BACK TABLE 60X90IN (DRAPES) ×3 IMPLANT
COVER SURGICAL LIGHT HANDLE (MISCELLANEOUS) ×2 IMPLANT
DRAPE POUCH INSTRU U-SHP 10X18 (DRAPES) ×3 IMPLANT
DRAPE SHEET LG 3/4 BI-LAMINATE (DRAPES) ×6 IMPLANT
DRSG VAC ATS MED SENSATRAC (GAUZE/BANDAGES/DRESSINGS) ×2 IMPLANT
ELECT PENCIL ROCKER SW 15FT (MISCELLANEOUS) ×3 IMPLANT
GAUZE SPONGE 4X4 12PLY STRL (GAUZE/BANDAGES/DRESSINGS) ×2 IMPLANT
GLOVE BIO SURGEON STRL SZ7.5 (GLOVE) ×3 IMPLANT
GLOVE BIOGEL PI IND STRL 8 (GLOVE) ×1 IMPLANT
GLOVE BIOGEL PI INDICATOR 8 (GLOVE) ×2
GOWN STRL REUS W/TWL XL LVL3 (GOWN DISPOSABLE) ×6 IMPLANT
HANDPIECE INTERPULSE COAX TIP (DISPOSABLE) ×3
KIT BASIN OR (CUSTOM PROCEDURE TRAY) ×3 IMPLANT
KIT TURNOVER KIT A (KITS) IMPLANT
NDL HYPO 25X1 1.5 SAFETY (NEEDLE) ×1 IMPLANT
NEEDLE HYPO 25X1 1.5 SAFETY (NEEDLE) ×3 IMPLANT
SET HNDPC FAN SPRY TIP SCT (DISPOSABLE) IMPLANT
SET IRRIG Y TYPE TUR BLADDER L (SET/KITS/TRAYS/PACK) IMPLANT
SPONGE LAP 4X18 RFD (DISPOSABLE) ×2 IMPLANT
STOCKINETTE 6  STRL (DRAPES) ×2
STOCKINETTE 6 STRL (DRAPES) ×1 IMPLANT
SUT ETHILON 4 0 PS 2 18 (SUTURE) IMPLANT
SUT MNCRL AB 3-0 PS2 18 (SUTURE) IMPLANT
SUT MON AB 5-0 PS2 18 (SUTURE) IMPLANT
SUT VIC AB 3-0 PS2 18 (SUTURE)
SUT VIC AB 3-0 PS2 18XBRD (SUTURE) IMPLANT
SUT VIC AB 4-0 PS2 18 (SUTURE) ×3 IMPLANT
SYR BULB IRRIGATION 50ML (SYRINGE) ×2 IMPLANT
SYR CONTROL 10ML LL (SYRINGE) ×3 IMPLANT
YANKAUER SUCT BULB TIP 10FT TU (MISCELLANEOUS) ×3 IMPLANT

## 2019-07-24 NOTE — Progress Notes (Signed)
PROGRESS NOTE    Katherine Herrera  [id number] DOB: 1966-06-15 DOA: 07/19/2019 PCP: Cassandria Anger, MD     Brief Narrative:  Katherine Herrera is a 53 year old female with history of type 2 diabetes mellitus, Charcot deformity of right foot status post fifth metatarsal resection recently, hypothyroidism, COPD, chronic pain and hyperlipidemia presented with worsened wound over the right foot metatarsal resection that she had about 2 weeks ago.  She was sent home on wound VAC and Augmentin but about 6 days ago, the wound VAC stopped working.  She was seen in the office by her podiatrist on the day of admission and recommended her to be admitted to the hospital for IV antibiotics and surgical intervention. She underwent debridement and wound vac placement.   New events last 24 hours / Subjective: Doing well overall, thinks that she is getting vaginal yeast infection due to using antibiotics over the past several weeks.  Assessment & Plan:   Active Problems:   Hypothyroidism   Dyslipidemia   TOBACCO USE DISORDER/SMOKER-SMOKING CESSATION DISCUSSED   FOOT PAIN   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)   Cellulitis and abscess of toe of right foot   Chronic pain   Diabetic foot ulcer (HCC)   Polyneuropathy due to type 2 diabetes mellitus ([place])   Right foot ulcer (HCC)   Right diabetic foot ulcer, with poor healing -Continue Zosyn. Added diflucan. Stop vanco today, no evidenced of MRSA, discussed with pharmacy.  -Right foot wound culture 9/19 pending, did show candida  -S/p I&D of the right foot by podiatry on 9/19, followed by wound VAC placement on 9/21  Type 2 diabetes mellitus, uncontrolled with hyperglycemia -A1c of 7.8.   Decrease Lantus dose today due to hypoglycemia this morning, continue SSI   Acute kidney injury  -Baseline Cr 0.97  -IVF   Hypothyroidism -Continue Synthroid  Tobacco abuse -Counseled on cessation  Chronic pain -Home meds continued     DVT prophylaxis: SCD Code Status: Full Family Communication: None Disposition Plan: Pending OR today, need home health on discharge    Consultants:   Podiatry    Antimicrobials:  Anti-infectives (From admission, onward)   Start     Dose/Rate Route Frequency Ordered Stop   07/25/19 1000  fluconazole (DIFLUCAN) tablet 200 mg     200 mg Oral Daily 07/24/19 1314     07/24/19 1000  fluconazole (DIFLUCAN) IVPB 400 mg  Status:  Discontinued     400 mg 100 mL/hr over 120 Minutes Intravenous Every 24 hours 07/24/19 0937 07/24/19 1314   07/23/19 0800  vancomycin (VANCOCIN) 1,500 mg in sodium chloride 0.9 % 500 mL IVPB  Status:  Discontinued     1,500 mg 250 mL/hr over 120 Minutes Intravenous Daily 07/23/19 0745 07/24/19 1310   07/22/19 1844  vancomycin (VANCOCIN) powder  Status:  Discontinued       As needed 07/22/19 1844 07/22/19 1901   07/22/19 1112  vancomycin variable dose per unstable renal function (pharmacist dosing)  Status:  Discontinued      Does not apply See admin instructions 07/22/19 1113 07/23/19 0745   07/21/19 0300  vancomycin (VANCOCIN) 1,500 mg in sodium chloride 0.9 % 500 mL IVPB  Status:  Discontinued     1,500 mg 250 mL/hr over 120 Minutes Intravenous Daily 07/20/19 0120 07/22/19 1113   07/20/19 0900  [MAR Hold]  piperacillin-tazobactam (ZOSYN) IVPB 3.375 g     (MAR Hold since Wed 07/24/2019 at 1227.Hold Reason:  Transfer to a Procedural area.)   3.375 g 12.5 mL/hr over 240 Minutes Intravenous Every 8 hours 07/20/19 0120     07/19/19 2230  vancomycin (VANCOCIN) 2,000 mg in sodium chloride 0.9 % 500 mL IVPB     2,000 mg 250 mL/hr over 120 Minutes Intravenous  Once 07/19/19 2200 07/20/19 0243   07/19/19 2200  piperacillin-tazobactam (ZOSYN) IVPB 3.375 g     3.375 g 100 mL/hr over 30 Minutes Intravenous NOW 07/19/19 2200 07/20/19 0317        Objective: Vitals:   07/23/19 1438 07/23/19 2128 07/24/19 0546 07/24/19 1237  BP: (!) 145/85 (!) 159/85 (!) 161/85  (!) 152/82  Pulse: 70 68 60 65  Resp: 16 20 20 18   Temp: 98.4 F (36.9 C) 98.2 F (36.8 C) 97.8 F (36.6 C) 97.9 F (36.6 C)  TempSrc: Oral Oral Oral Oral  SpO2: 98% 93% 100% 98%  Weight:      Height:        Intake/Output Summary (Last 24 hours) at 07/24/2019 1411 Last data filed at 07/23/2019 2200 Gross per 24 hour  Intake 140 ml  Output -  Net 140 ml   Filed Weights   07/19/19 2335 07/22/19 1741 07/22/19 2016  Weight: 101.8 kg 101.8 kg 115.9 kg    Examination:  General exam: Appears calm and comfortable  Respiratory system: Clear to auscultation. Respiratory effort normal. No respiratory distress. No conversational dyspnea.  Cardiovascular system: S1 & S2 heard, RRR. No murmurs. No pedal edema. Gastrointestinal system: Abdomen is nondistended, soft and nontender. Normal bowel sounds heard. Central nervous system: Alert and oriented. No focal neurological deficits. Speech clear.  Extremities: Symmetric in appearance, right leg in boot with wound vac Skin: No rashes, lesions or ulcers on exposed skin  Psychiatry: Judgement and insight appear normal. Mood & affect appropriate.   Data Reviewed: I have personally reviewed following labs and imaging studies  CBC: Recent Labs  Lab 07/19/19 2320 07/20/19 0451 07/22/19 0438  WBC 11.4* 9.9 7.7  NEUTROABS 6.3  --   --   HGB 13.6 12.1 11.7*  HCT 43.1 38.7 37.5  MCV 88.3 88.2 88.2  PLT 492* 448* [id number]   Basic Metabolic Panel: Recent Labs  Lab 07/19/19 2320 07/20/19 0451 07/21/19 0518 07/22/19 0438 07/23/19 0421 07/24/19 0540  NA 139 138  --  134*  --   --   K 4.2 4.5  --  4.6  --   --   CL 98 102  --  97*  --   --   CO2 29 26  --  28  --   --   GLUCOSE 260* 164*  --  384*  --   --   BUN 19 23*  --  28*  --   --   CREATININE 1.16* 1.11* 1.18* 1.39* 1.23* 1.32*  CALCIUM 9.1 8.8*  --  8.6*  --   --   MG  --  2.1  --   --   --   --   PHOS  --  4.8*  --   --   --   --    GFR: Estimated Creatinine Clearance: 67  mL/min (A) (by C-G formula based on SCr of 1.32 mg/dL (H)). Liver Function Tests: Recent Labs  Lab 07/19/19 2320 07/20/19 0451  AST 13* 11*  ALT 11 10  ALKPHOS 112 95  BILITOT 0.2* 0.4  PROT 7.9 7.0  ALBUMIN 3.4* 2.9*   No results for input(s): LIPASE, AMYLASE  in the last 168 hours. No results for input(s): AMMONIA in the last 168 hours. Coagulation Profile: Recent Labs  Lab 07/19/19 2320  INR 1.0   Cardiac Enzymes: No results for input(s): CKTOTAL, CKMB, CKMBINDEX, TROPONINI in the last 168 hours. BNP (last 3 results) No results for input(s): PROBNP in the last 8760 hours. HbA1C: No results for input(s): HGBA1C in the last 72 hours. CBG: Recent Labs  Lab 07/23/19 1641 07/23/19 2126 07/24/19 0816 07/24/19 0924 07/24/19 1218  GLUCAP 191* 196* 69* 109* 90   Lipid Profile: No results for input(s): CHOL, HDL, LDLCALC, TRIG, CHOLHDL, LDLDIRECT in the last 72 hours. Thyroid Function Tests: No results for input(s): TSH, T4TOTAL, FREET4, T3FREE, THYROIDAB in the last 72 hours. Anemia Panel: No results for input(s): VITAMINB12, FOLATE, FERRITIN, TIBC, IRON, RETICCTPCT in the last 72 hours. Sepsis Labs: No results for input(s): PROCALCITON, LATICACIDVEN in the last 168 hours.  Recent Results (from the past 240 hour(s))  SARS CORONAVIRUS 2 (TAT 6-24 HRS) Nasopharyngeal Nasopharyngeal Swab     Status: None   Collection Time: 07/19/19  9:43 PM   Specimen: Nasopharyngeal Swab  Result Value Ref Range Status   SARS Coronavirus 2 NEGATIVE NEGATIVE Final    Comment: (NOTE) SARS-CoV-2 target nucleic acids are NOT DETECTED. The SARS-CoV-2 RNA is generally detectable in upper and lower respiratory specimens during the acute phase of infection. Negative results do not preclude SARS-CoV-2 infection, do not rule out co-infections with other pathogens, and should not be used as the sole basis for treatment or other patient management decisions. Negative results must be combined with  clinical observations, patient history, and epidemiological information. The expected result is Negative. Fact Sheet for Patients: SugarRoll.be Fact Sheet for Healthcare Providers: https://www.woods-mathews.com/ This test is not yet approved or cleared by the [place] FDA and  has been authorized for detection and/or diagnosis of SARS-CoV-2 by FDA under an Emergency Use Authorization (EUA). This EUA will remain  in effect (meaning this test can be used) for the duration of the COVID-19 declaration under Section 56 4(b)(1) of the Act, 21 U.S.C. section 360bbb-3(b)(1), unless the authorization is terminated or revoked sooner. Performed at [facility], [place] [address] [place], [place] [address]   MRSA PCR Screening     Status: None   Collection Time: 07/19/19  9:54 PM   Specimen: Nasal Mucosa; Nasopharyngeal  Result Value Ref Range Status   MRSA by PCR NEGATIVE NEGATIVE Final    Comment:        The GeneXpert MRSA Assay (FDA approved for NASAL specimens only), is one component of a comprehensive MRSA colonization surveillance program. It is not intended to diagnose MRSA infection nor to guide or monitor treatment for MRSA infections. Performed at [facility], [place] [address], [place], [place] [address]   Blood Cultures x 2 sites     Status: None (Preliminary result)   Collection Time: 07/19/19 11:20 PM   Specimen: BLOOD RIGHT FOREARM  Result Value Ref Range Status   Specimen Description BLOOD RIGHT FOREARM  Final   Special Requests   Final    BOTTLES DRAWN AEROBIC ONLY Blood Culture results may not be optimal due to an excessive volume of blood received in culture bottles   Culture   Final    NO GROWTH 4 DAYS Performed at [facility], [place] [address], [place], [place] [address]    Report Status PENDING  Incomplete  Blood Cultures x 2 sites  Status: None (Preliminary result)   Collection Time:  07/19/19 11:20 PM   Specimen: BLOOD  Result Value Ref Range Status   Specimen Description   Final    BLOOD RIGHT ANTECUBITAL Performed at [place] [address], [place], [place] [address]    Special Requests   Final    BOTTLES DRAWN AEROBIC ONLY Blood Culture results may not be optimal due to an excessive volume of blood received in culture bottles   Culture   Final    NO GROWTH 4 DAYS Performed at [facility], [place] [address] [place], [place] [address]    Report Status PENDING  Incomplete  Aerobic/Anaerobic Culture (surgical/deep wound)     Status: None (Preliminary result)   Collection Time: 07/20/19  2:40 PM   Specimen: PATH Other; Tissue  Result Value Ref Range Status   Specimen Description   Final    WOUND RIGHT FOOT Performed at [facility], [place] [address], [place], [place] [address]    Special Requests   Final    NONE Performed at [facility], [place] [address], [place], [place] [address]    Gram Stain   Final    RARE WBC PRESENT, PREDOMINANTLY PMN NO ORGANISMS SEEN Performed at [facility], [place] [address], [place], [place] [address]    Culture   Final    FEW CANDIDA ALBICANS NO ANAEROBES ISOLATED; CULTURE IN PROGRESS FOR 5 DAYS    Report Status PENDING  Incomplete      Radiology Studies: No results found.    Scheduled Meds: . [MAR Hold] baclofen  20 mg Oral BID  . [MAR Hold] ezetimibe  10 mg Oral Daily  . [START ON 07/25/2019] fluconazole  200 mg Oral Daily  . [MAR Hold] gabapentin  800 mg Oral QID  . [MAR Hold] insulin aspart  0-15 Units Subcutaneous TID WC  . [MAR Hold] insulin glargine  32 Units Subcutaneous BID  . [MAR Hold] lamoTRIgine  25 mg Oral BID  . [MAR Hold] levothyroxine  75 mcg Oral Daily  . [MAR Hold] nutrition supplement (JUVEN)  1 packet Oral BID BM  . [MAR Hold] rOPINIRole  0.5 mg Oral QHS  . [MAR Hold] senna  1 tablet Oral BID  . [MAR Hold] sodium chloride flush  3 mL  Intravenous Q12H   Continuous Infusions: . [MAR Hold] sodium chloride Stopped (07/22/19 1512)  . lactated ringers    . [MAR Hold] piperacillin-tazobactam (ZOSYN)  IV 3.375 g (07/24/19 0530)     LOS: 5 days      Time spent: 35 minutes   Dessa Phi, DO [facility] [email or web address] 07/24/2019, 2:11 PM

## 2019-07-24 NOTE — Transfer of Care (Signed)
Immediate Anesthesia Transfer of Care Note  Patient: Katherine Herrera  Procedure(s) Performed: IRRIGATION AND DEBRIDEMENT WOUND, APPLICATION OF WOUND VAC (Right Foot)  Patient Location: PACU  Anesthesia Type:MAC  Level of Consciousness: oriented, drowsy, patient cooperative and responds to stimulation  Airway & Oxygen Therapy: Patient Spontanous Breathing and Patient connected to face mask oxygen  Post-op Assessment: Report given to RN and Post -op Vital signs reviewed and stable  Post vital signs: Reviewed and stable  Last Vitals:  Vitals Value Taken Time  BP    Temp    Pulse    Resp    SpO2      Last Pain:  Vitals:   07/24/19 1237  TempSrc: Oral  PainSc:       Patients Stated Pain Goal: 2 (75/10/25 8527)  Complications: No apparent anesthesia complications

## 2019-07-24 NOTE — Anesthesia Postprocedure Evaluation (Signed)
Anesthesia Post Note  Patient: Katherine Herrera  Procedure(s) Performed: IRRIGATION AND DEBRIDEMENT RIGHT FOOT; APPLICATION OF WOUND VAC (Right Foot)     Patient location during evaluation: PACU Anesthesia Type: General Level of consciousness: awake and alert Pain management: pain level controlled Vital Signs Assessment: post-procedure vital signs reviewed and stable Respiratory status: spontaneous breathing, nonlabored ventilation, respiratory function stable and patient connected to nasal cannula oxygen Cardiovascular status: blood pressure returned to baseline and stable Postop Assessment: no apparent nausea or vomiting Anesthetic complications: no    Last Vitals:  Vitals:   07/24/19 1515 07/24/19 1539  BP: 124/78 135/80  Pulse: 60 61  Resp: 11 14  Temp:  36.7 C  SpO2: 100% 100%    Last Pain:  Vitals:   07/24/19 1539  TempSrc: Oral  PainSc: [place]

## 2019-07-24 NOTE — Anesthesia Preprocedure Evaluation (Addendum)
Anesthesia Evaluation  Patient identified by MRN, date of birth, ID band Patient awake    Reviewed: Allergy & Precautions, NPO status , Patient's Chart, lab work & pertinent test results  Airway Mallampati: II  TM Distance: >3 FB Neck ROM: Full    Dental  (+) Teeth Intact, Dental Advisory Given   Pulmonary COPD, Current Smoker and Patient abstained from smoking.,    Pulmonary exam normal breath sounds clear to auscultation       Cardiovascular (-) hypertension(-) angina+ DOE  (-) CAD Normal cardiovascular exam Rhythm:Regular Rate:Normal     Neuro/Psych  Headaches, PSYCHIATRIC DISORDERS Anxiety conversion disorder vs psychogenic seizures Neuromuscular disease    GI/Hepatic negative GI ROS, Neg liver ROS,   Endo/Other  diabetes, Type 2, Insulin DependentHypothyroidism   Renal/GU Renal InsufficiencyRenal disease     Musculoskeletal  (+) Arthritis ,   Abdominal   Peds  Hematology  (+) Blood dyscrasia, anemia ,   Anesthesia Other Findings Day of surgery medications reviewed with the patient.  Reproductive/Obstetrics                            Anesthesia Physical Anesthesia Plan  ASA: III  Anesthesia Plan: General   Post-op Pain Management:    Induction: Intravenous  PONV Risk Score and Plan: 2 and Midazolam, Dexamethasone and Ondansetron  Airway Management Planned: LMA  Additional Equipment:   Intra-op Plan:   Post-operative Plan: Extubation in OR  Informed Consent: I have reviewed the patients History and Physical, chart, labs and discussed the procedure including the risks, benefits and alternatives for the proposed anesthesia with the patient or authorized representative who has indicated his/her understanding and acceptance.     Dental advisory given  Plan Discussed with: CRNA  Anesthesia Plan Comments:         Anesthesia Quick Evaluation

## 2019-07-24 NOTE — Anesthesia Postprocedure Evaluation (Signed)
Anesthesia Post Note  Patient: Katherine Herrera  Procedure(s) Performed: IRRIGATION AND DEBRIDEMENT WOUND, APPLICATION OF WOUND VAC (Right Foot)     Patient location during evaluation: PACU Anesthesia Type: MAC Level of consciousness: awake and alert, awake and oriented Pain management: pain level controlled Vital Signs Assessment: post-procedure vital signs reviewed and stable Respiratory status: spontaneous breathing, nonlabored ventilation and respiratory function stable Cardiovascular status: stable and blood pressure returned to baseline Postop Assessment: no apparent nausea or vomiting Anesthetic complications: no    Last Vitals:  Vitals:   07/24/19 1539 07/24/19 1742  BP: 135/80 (!) 175/81  Pulse: 61 64  Resp: 14   Temp: 36.7 C 36.8 C  SpO2: 100% 100%    Last Pain:  Vitals:   07/24/19 1742  TempSrc: Oral  PainSc:                  Catalina Gravel

## 2019-07-24 NOTE — Progress Notes (Signed)
Pharmacy Antibiotic Note  Katherine Herrera is a 53 y.o. female admitted on 07/19/2019 with right diabetic foot ulcer s/p I&D 9/19, 9/21, 9/23.  Pharmacy initially consulted for Vancomycin and Zosyn dosing. MD adding Fluconazole today for Candida albicans in foot wound culture. Vancomycin has been discontinued per MD.    Plan: -Continue Zosyn 3.375g IV q8h (each dose infused over 4 hours) -Fluconazole 400mg  x 1, then 200mg  PO daily -Monitor renal function, cultures, clinical course, duration of therapy    Height: 5\' 9"  (175.3 cm) Weight: 255 lb 8.2 oz (115.9 kg) IBW/kg (Calculated) : 66.2Temp (24hrs), Avg:98.1 F (36.7 C), Min:97.8 F (36.6 C), Max:98.4 F (36.9 C)  Recent Labs  Lab 07/19/19 2320 07/20/19 0451 07/21/19 0518 07/22/19 0438 07/23/19 0421 07/24/19 0540  WBC 11.4* 9.9  --  7.7  --   --   CREATININE 1.16* 1.11* 1.18* 1.39* 1.23* 1.32*  VANCORANDOM  --   --   --   --  11  --     Estimated Creatinine Clearance: 67 mL/min (A) (by C-G formula based on SCr of 1.32 mg/dL (H)).    Allergies  Allergen Reactions  . Hydromorphone Hcl Itching    Patient has been tolerating Hydromorphone tablets without adverse effect (07/09/19)  . Cephalexin Nausea And Vomiting  . Demerol  [Meperidine Hcl] Rash  . Lovastatin Other (See Comments)    Other reaction(s): Unknown (See Comments) Possible myalgia Unknown   . Metformin And Related Nausea And Vomiting  . Sulfamethoxazole-Trimethoprim     Other reaction(s): Unknown DILI, pancreatitis  . Crestor [Rosuvastatin Calcium]     myalgia  . Niacin Other (See Comments)    Unknown   . Paroxetine Other (See Comments)    Other reaction(s): Unknown (See Comments) unknown Unknown     Antimicrobials this admission: 9/18 Vancomycin >> 9/23 9/18 Zosyn >>  9/23 Fluconazole >>  Microbiology results: 9/18 BCx: NGTD  9/18 MRSA PCR: negative 9/18 COVID: negative  9/19 right foot wound cx: few Candida albicans, no anaerobes isolated; cx in  progress x 5 days  9/23 wound cx: sent  9/23 fungus culture: sent   Thank you for allowing pharmacy to be a part of this patient's care.   Lindell Spar, PharmD, BCPS Clinical Pharmacist  07/24/2019, 10:46 AM

## 2019-07-24 NOTE — Op Note (Addendum)
Patient Name: Katherine Herrera DOB: [birth year]  MRN: [id number]   Date of Service: 07/24/2019  Surgeon: Dr. Hardie Pulley, DPM Assistants: None Pre-operative Diagnosis: right foot ulcer Post-operative Diagnosis: same Procedures:             1) Debridement of left foot wound  2) Application of wound VAC Pathology/Specimens: ID Type Source Tests Collected by Time Destination  A : DEEP SWAB RIGHT FOOT Tissue PATH Other ANAEROBIC CULTURE, FUNGUS CULTURE WITH STAIN, AEROBIC/ANAEROBIC CULTURE (SURGICAL/DEEP WOUND) Evelina Bucy, DPM 07/24/2019 1410    Anesthesia: MAC Hemostasis: Anatomic Estimated Blood Loss: 2 ml Materials: None Medications: 1g Vancomycin powder Complications: None  Indications for Procedure:  This is a 53 y.o. female with a chronic RLE wound. She presents today for debridement and wound VAC application to promote healing.   Procedure in Detail: Patient was identified in pre-operative holding area. Formal consent was signed and the right lower extremity was marked. Patient was brought back to the operating room and placed on the operating room table in the supine position. Anesthesia was induced.   The extremity was prepped and draped in the usual sterile fashion. Timeout was taken to confirm patient name, laterality, and procedure prior to incision. Attention was then directed to the right foot where a wound measuring 3x2 was encountered.  A swab culture was taken of the deep soft tissue for microbiology.  The wound was sharply excisionally debrided with a 15 blade, followed by a misonix ultrasonic debrider. Debridement was performed to bleeding, viable wound base. The wound was then copiously irrigated with 2L NS.  A wound VAC was applied, with a black sponge adhered to the wound bed, followed by occlusive dressing. The trackpad was placed and set to the wound VAC machine at 125 mmHg.   The foot was then dressed with 4x4, kerlix, and ACE bandage. Patient tolerated the  procedure well.   Disposition: Following a period of post-operative monitoring, patient will be transferred back to the floor.

## 2019-07-24 NOTE — Progress Notes (Signed)
Subjective:  Patient ID: Katherine Herrera, female    DOB: 01/03/1966,  MRN: [id number]  Seen in pre-op. Understands plan for OR today. Objective:   Vitals:   07/24/19 0546 07/24/19 1237  BP: (!) 161/85 (!) 152/82  Pulse: 60 65  Resp: 20 18  Temp: 97.8 F (36.6 C) 97.9 F (36.6 C)  SpO2: 100% 98%   General AA&O x3. Normal mood and affect.  Vascular Dorsalis pedis and posterior tibial pulses 2/4 bilat. Brisk capillary refill to all digits. Pedal hair present.  Neurologic Epicritic sensation grossly diminished.  Dermatologic R foot dressing c/d/i. WVAc on and functioning minimal drainage in cannister.  Orthopedic: MMT 5/5 in dorsiflexion, plantarflexion, inversion, and eversion. Normal joint ROM without pain or crepitus.   Results for orders placed or performed during the hospital encounter of 07/19/19 (from the past 24 hour(s))  Glucose, capillary     Status: Abnormal   Collection Time: 07/23/19  4:41 PM  Result Value Ref Range   Glucose-Capillary 191 (H) 70 - 99 mg/dL  Glucose, capillary     Status: Abnormal   Collection Time: 07/23/19  9:26 PM  Result Value Ref Range   Glucose-Capillary 196 (H) 70 - 99 mg/dL  Creatinine, serum     Status: Abnormal   Collection Time: 07/24/19  5:40 AM  Result Value Ref Range   Creatinine, Ser 1.32 (H) 0.44 - 1.00 mg/dL   GFR calc non Af Amer 46 (L) >60 mL/min   GFR calc Af Amer 53 (L) >60 mL/min  Glucose, capillary     Status: Abnormal   Collection Time: 07/24/19  8:16 AM  Result Value Ref Range   Glucose-Capillary 69 (L) 70 - 99 mg/dL  Glucose, capillary     Status: Abnormal   Collection Time: 07/24/19  9:24 AM  Result Value Ref Range   Glucose-Capillary 109 (H) 70 - 99 mg/dL  Glucose, capillary     Status: None   Collection Time: 07/24/19 12:18 PM  Result Value Ref Range   Glucose-Capillary 90 70 - 99 mg/dL    Results for orders placed or performed during the hospital encounter of 07/19/19  SARS CORONAVIRUS 2 (TAT 6-24 HRS)  Nasopharyngeal Nasopharyngeal Swab     Status: None   Collection Time: 07/19/19  9:43 PM   Specimen: Nasopharyngeal Swab  Result Value Ref Range Status   SARS Coronavirus 2 NEGATIVE NEGATIVE Final    Comment: (NOTE) SARS-CoV-2 target nucleic acids are NOT DETECTED. The SARS-CoV-2 RNA is generally detectable in upper and lower respiratory specimens during the acute phase of infection. Negative results do not preclude SARS-CoV-2 infection, do not rule out co-infections with other pathogens, and should not be used as the sole basis for treatment or other patient management decisions. Negative results must be combined with clinical observations, patient history, and epidemiological information. The expected result is Negative. Fact Sheet for Patients: SugarRoll.be Fact Sheet for Healthcare Providers: https://www.woods-mathews.com/ This test is not yet approved or cleared by the [place] FDA and  has been authorized for detection and/or diagnosis of SARS-CoV-2 by FDA under an Emergency Use Authorization (EUA). This EUA will remain  in effect (meaning this test can be used) for the duration of the COVID-19 declaration under Section 56 4(b)(1) of the Act, 21 U.S.C. section 360bbb-3(b)(1), unless the authorization is terminated or revoked sooner. Performed at [facility], [place] [address] [place], [place] [address]   MRSA PCR Screening     Status: None   Collection  Time: 07/19/19  9:54 PM   Specimen: Nasal Mucosa; Nasopharyngeal  Result Value Ref Range Status   MRSA by PCR NEGATIVE NEGATIVE Final    Comment:        The GeneXpert MRSA Assay (FDA approved for NASAL specimens only), is one component of a comprehensive MRSA colonization surveillance program. It is not intended to diagnose MRSA infection nor to guide or monitor treatment for MRSA infections. Performed at [facility], [place] [address]  [place], [place] [address]   Blood Cultures x 2 sites     Status: None (Preliminary result)   Collection Time: 07/19/19 11:20 PM   Specimen: BLOOD RIGHT FOREARM  Result Value Ref Range Status   Specimen Description BLOOD RIGHT FOREARM  Final   Special Requests   Final    BOTTLES DRAWN AEROBIC ONLY Blood Culture results may not be optimal due to an excessive volume of blood received in culture bottles   Culture   Final    NO GROWTH 4 DAYS Performed at [facility], [place] [address], [place], [place] [address]    Report Status PENDING  Incomplete  Blood Cultures x 2 sites     Status: None (Preliminary result)   Collection Time: 07/19/19 11:20 PM   Specimen: BLOOD  Result Value Ref Range Status   Specimen Description   Final    BLOOD RIGHT ANTECUBITAL Performed at [place] [address], [place], [place] [address]    Special Requests   Final    BOTTLES DRAWN AEROBIC ONLY Blood Culture results may not be optimal due to an excessive volume of blood received in culture bottles   Culture   Final    NO GROWTH 4 DAYS Performed at [facility], [place] [address] [place], [place] [address]    Report Status PENDING  Incomplete  Aerobic/Anaerobic Culture (surgical/deep wound)     Status: None (Preliminary result)   Collection Time: 07/20/19  2:40 PM   Specimen: PATH Other; Tissue  Result Value Ref Range Status   Specimen Description   Final    WOUND RIGHT FOOT Performed at [facility], [place] [address] [place], [place] [address]    Special Requests   Final    NONE Performed at [facility], [place] [address], [place], [place] [address]    Gram Stain   Final    RARE WBC PRESENT, PREDOMINANTLY PMN NO ORGANISMS SEEN Performed at [facility], [place] [address] [place], [place] [address]    Culture   Final    FEW CANDIDA ALBICANS NO ANAEROBES ISOLATED; CULTURE IN PROGRESS FOR 5 DAYS    Report Status PENDING  Incomplete     Assessment & Plan:  Patient was evaluated and treated and all questions answered.  Right Foot Cellulitis with wound dehiscence s/p Debridement and irrigation -Continue abx. Cultures growing yeast. Recc add on coverage for yeast, consider fluconazole. -Continue pain meds PRN -OR today for repeat debridement, wound VAC application  Will follow post-op.  Evelina Bucy, DPM  Accessible via secure chat for questions or concerns.

## 2019-07-24 NOTE — Progress Notes (Signed)
npatient Diabetes Program Recommendations  AACE/ADA: New Consensus Statement on Inpatient Glycemic Control (2015)  Target Ranges:  Prepandial:   less than 140 mg/dL      Peak postprandial:   less than 180 mg/dL (1-2 hours)      Critically ill patients:  140 - 180 mg/dL   Lab Results  Component Value Date   GLUCAP 69 (L) 07/24/2019   HGBA1C 7.8 (H) 07/19/2019    Review of Glycemic Control Results for Katherine Herrera, Katherine Herrera (MRN [id number]) as of 07/24/2019 08:54  Ref. Range 07/23/2019 08:02 07/23/2019 12:33 07/23/2019 16:41 07/23/2019 21:26 07/24/2019 08:16  Glucose-Capillary Latest Ref Range: 70 - 99 mg/dL 323 (H) 263 (H) 191 (H) 196 (H) 69 (L)    Diabetes history: DM 2 Outpatient Diabetes medications: 70/30 70 units tid Current orders for Inpatient glycemic control:  Lantus 35 units bid Novolog 0-15 units tid Novolog 8 units tid meal coverage  A1c 7.8% on 9/18  Inpatient Diabetes Program Recommendations:    Trends much better. Very mild hypoglycemia of 69 this am. Recommend decreasing Lantus to 32 units bid.  Thanks,  Tama Headings RN, MSN, BC-ADM Inpatient Diabetes Coordinator Team Pager [phone] (8a-5p)

## 2019-07-25 ENCOUNTER — Encounter (HOSPITAL_COMMUNITY): Payer: Self-pay | Admitting: Podiatry

## 2019-07-25 LAB — BASIC METABOLIC PANEL
Anion gap: 6 (ref 5–15)
BUN: 25 mg/dL — ABNORMAL HIGH (ref 6–20)
CO2: 31 mmol/L (ref 22–32)
Calcium: 8.8 mg/dL — ABNORMAL LOW (ref 8.9–10.3)
Chloride: 104 mmol/L (ref 98–111)
Creatinine, Ser: 1.3 mg/dL — ABNORMAL HIGH (ref 0.44–1.00)
GFR calc Af Amer: 54 mL/min — ABNORMAL LOW (ref >60)
GFR calc non Af Amer: 47 mL/min — ABNORMAL LOW (ref >60)
Glucose, Bld: 75 mg/dL (ref 70–99)
Potassium: 5.7 mmol/L — ABNORMAL HIGH (ref 3.5–5.1)
Sodium: 141 mmol/L (ref 135–145)

## 2019-07-25 LAB — CULTURE, BLOOD (ROUTINE X 2)
Culture: NO GROWTH
Culture: NO GROWTH

## 2019-07-25 LAB — AEROBIC/ANAEROBIC CULTURE W GRAM STAIN (SURGICAL/DEEP WOUND)

## 2019-07-25 LAB — CBC
HCT: 36.6 % (ref 36.0–46.0)
Hemoglobin: 11.3 g/dL — ABNORMAL LOW (ref 12.0–15.0)
MCH: 27.6 pg (ref 26.0–34.0)
MCHC: 30.9 g/dL (ref 30.0–36.0)
MCV: 89.3 fL (ref 80.0–100.0)
Platelets: 312 10*3/uL (ref 150–400)
RBC: 4.1 MIL/uL (ref 3.87–5.11)
RDW: 12.9 % (ref 11.5–15.5)
WBC: 9.8 10*3/uL (ref 4.0–10.5)
nRBC: 0 % (ref 0.0–0.2)

## 2019-07-25 LAB — GLUCOSE, CAPILLARY
Glucose-Capillary: 53 mg/dL — ABNORMAL LOW (ref 70–99)
Glucose-Capillary: 55 mg/dL — ABNORMAL LOW (ref 70–99)
Glucose-Capillary: 131 mg/dL — ABNORMAL HIGH (ref 70–99)
Glucose-Capillary: 155 mg/dL — ABNORMAL HIGH (ref 70–99)

## 2019-07-25 LAB — POTASSIUM: Potassium: 5.5 mmol/L — ABNORMAL HIGH (ref 3.5–5.1)

## 2019-07-25 MED ORDER — FLUCONAZOLE 200 MG PO TABS: 200.0000 mg | ORAL_TABLET | Freq: Every day | ORAL | 0 refills | Status: AC

## 2019-07-25 MED ORDER — GLUCOSE 4 G PO CHEW
CHEWABLE_TABLET | ORAL | Status: AC
  Administered 2019-07-25: 8 g
  Filled 2019-07-25: qty 1

## 2019-07-25 MED ORDER — AMOXICILLIN-POT CLAVULANATE 875-125 MG PO TABS: 1.0000 | ORAL_TABLET | Freq: Two times a day (BID) | ORAL | 0 refills | Status: AC

## 2019-07-25 MED ORDER — INSULIN GLARGINE 100 UNIT/ML ~~LOC~~ SOLN
20.0000 [IU] | Freq: Every day | SUBCUTANEOUS | Status: DC
  Filled 2019-07-25: qty 0.2

## 2019-07-25 MED ORDER — FUROSEMIDE 10 MG/ML IJ SOLN
40.0000 mg | Freq: Once | INTRAMUSCULAR | Status: AC
  Administered 2019-07-25: 40 mg via INTRAVENOUS
  Filled 2019-07-25: qty 4

## 2019-07-25 MED ORDER — INSULIN NPH ISOPHANE & REGULAR (70-30) 100 UNIT/ML ~~LOC~~ SUSP: 10.0000 [IU] | Freq: Two times a day (BID) | SUBCUTANEOUS | 11 refills | Status: DC

## 2019-07-25 NOTE — TOC Initial Note (Addendum)
Transition of Care [facility]) - Initial/Assessment Note    Patient Details  Name: Katherine Herrera MRN: [id number] Date of Birth: 1966-02-26  Transition of [facility]) CM/SW Contact:    Nila Nephew, LCSW Phone Number: [phone] 07/25/2019, 9:31 AM  Clinical Narrative:  Pt admitted with right foot ulcer. Had recently had admission and went home with provena wound vac after toe resection, reportedly wound vac stopped working and she required admission for antibiotics and debridement. Pt had [facility] home health PTA and will resume at DC. CSW arranging new wound vac with [employer].  Will follow up.           14:33- Wound vac authorization complete, [employer] delivering to pt this afternoon. Ordered wheelchair for pt from [facility]. Notified [facility] of pt's DC today.      Expected Discharge Plan and [facility]/DME                         Activities of Daily Living Home Assistive Devices/Equipment: None ADL Screening (condition at time of admission) Patient's cognitive ability adequate to safely complete daily activities?: Yes Is the patient deaf or have difficulty hearing?: No Does the patient have difficulty seeing, even when wearing glasses/contacts?: No Does the patient have difficulty concentrating, remembering, or making decisions?: No Patient able to express need for assistance with ADLs?: Yes Does the patient have difficulty dressing or bathing?: No Independently performs ADLs?: Yes (appropriate for developmental age) Does the patient have difficulty walking or climbing stairs?: No Weakness of Legs: Both Weakness of Arms/Hands: None   Admission diagnosis:  rt foot cellulitis Patient Active Problem List   Diagnosis Date Noted  . Peroneal tendonitis, right   . Right foot ulcer ([place]) 07/04/2019  . Capsulitis of metatarsophalangeal (MTP) joint of right foot   . Exostosis of bone of foot   . Preop exam for internal medicine 12/19/2018  . Polyneuropathy due  to type 2 diabetes mellitus ([place]) 10/01/2018  . Mild renal insufficiency 09/29/2018  . Hyponatremia 09/29/2018  . Chronic pain 09/29/2018  . Cellulitis of left lower leg   . Diabetic foot ulcer ([place])   . Sepsis due to cellulitis ([place]) 09/28/2018  . Syncope 07/30/2018  . Earache on right 08/02/2017  . Cerumen impaction 08/02/2017  . Constipation due to pain medication 05/22/2017  . Elevated liver enzymes 05/22/2017  . AKI (acute kidney injury) ([place]) 05/03/2017  . Charcot foot due to diabetes mellitus ([place]) 04/23/2017  . Leg swelling 10/28/2016  . Knee contusion 01/13/2016  . Pain from implanted hardware 01/06/2016  . Cellulitis and abscess of toe of right foot 07/24/2015  . Nausea without vomiting 07/24/2015  . Leg edema, left 06/19/2015  . DOE (dyspnea on exertion) 06/16/2015  . Chest pain, atypical 06/16/2015  . Family history of musculoskeletal disease 03/12/2015  . Non-compliant behavior 12/24/2014  . Ulcer of right foot limited to breakdown of skin ([place]) 09/02/2014  . Ulcer of foot due to diabetes mellitus ([place]) 08/12/2014  . Ulcer of other part of foot 06/20/2014  . Leg pain, left 03/31/2014  . Callus of foot 03/25/2014  . 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 Rash 01/28/2014  . Pain in lower limb 01/24/2014  . Keratosis 01/24/2014  . Leg cramps 10/09/2013  . Vaginitis and vulvovaginitis 10/09/2013  . Ingrown nail 09/30/2013  . Pain in toe of left foot 09/30/2013  . Tenosynovitis of foot and ankle 09/03/2013  .  Metatarsal deformity, left 09/03/2013  . Pain in joint, ankle and foot 01/22/2013  . Pes cavus 10/13/2012  . Acquired tight Achilles tendon 10/13/2012  . Paronychia 06/14/2012  . 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 ([place]) 03/19/2012  . Allergic rhinitis 03/19/2012  . URI (upper respiratory infection) 08/30/2011  . Myalgia 04/18/2011  . SHOULDER PAIN 01/17/2011  . TTS (tarsal tunnel syndrome)   . GASTROENTERITIS 10/18/2010  . ARTHRALGIA 01/04/2010   . WEIGHT GAIN, ABNORMAL 01/04/2010  . TACHYCARDIA 10/02/2009  . CBC, ABNORMAL 05/01/2009  . B12 deficiency 01/05/2009  . GOUT 01/05/2009  . Edema 01/05/2009  . Anxiety state 07/28/2008  . ELBOW PAIN 07/28/2008  . TOBACCO USE DISORDER/SMOKER-SMOKING CESSATION DISCUSSED 04/21/2008  . FOOT PAIN 04/21/2008  . PARESTHESIA 04/21/2008  . Abdominal pain 04/21/2008  . SINUSITIS- ACUTE-NOS 11/13/2007  . COPD exacerbation ([place]) 11/13/2007  . Diabetic neuropathy ([place]) 08/27/2007  . LOW BACK PAIN 08/27/2007  . Dyslipidemia 08/20/2007  . Hypothyroidism 05/22/2007   PCP:  Cassandria Anger, MD Pharmacy:   [facility], [address] [place] [address] Phone: [phone] Fax: [phone]  [id number] - [place], [place] [address] Phone: [phone] Fax: [phone]     Social Determinants of Health (SDOH) Interventions    Readmission Risk Interventions No flowsheet data found.

## 2019-07-25 NOTE — Care Management Important Message (Signed)
Important Message  Patient Details IM Letter given to Velva Harman RN to present to the Patient Name: Katherine Herrera MRN: [id number] Date of Birth: 1966/04/16   Medicare Important Message Given:  Yes     Kerin Salen 07/25/2019, 10:18 AM

## 2019-07-25 NOTE — Progress Notes (Addendum)
npatient Diabetes Program Recommendations  AACE/ADA: New Consensus Statement on Inpatient Glycemic Control (2015)  Target Ranges:  Prepandial:   less than 140 mg/dL      Peak postprandial:   less than 180 mg/dL (1-2 hours)      Critically ill patients:  140 - 180 mg/dL   Lab Results  Component Value Date   GLUCAP 55 (L) 07/25/2019   HGBA1C 7.8 (H) 07/19/2019    Review of Glycemic Control Results for Katherine Herrera, Katherine Herrera (MRN [id number]) as of 07/25/2019 09:49  Ref. Range 07/24/2019 08:16 07/24/2019 09:24 07/24/2019 12:18 07/24/2019 17:39 07/24/2019 20:46 07/24/2019 21:12 07/24/2019 21:14 07/24/2019 21:37 07/24/2019 22:17 07/25/2019 08:29 07/25/2019 09:16  Glucose-Capillary Latest Ref Range: 70 - 99 mg/dL 69 (L) 109 (H) 90 82 67 (L) 65 (L) 66 (L) 62 (L) 102 (H) 53 (L) 55 (L)   Diabetes history: DM 2 Outpatient Diabetes medications: 70/30 70 units tid Current orders for Inpatient glycemic control:  Lantus 35 units bid Novolog 0-15 units tid Novolog 8 units tid meal coverage  A1c 7.8% on 9/18  Inpatient Diabetes Program Recommendations:    Glucose trends much lower. Only received one dose of Lantus 32 units last night. Hypoglycemia in the 50's this am.   Consider reducing Lantus dose to once a day 22 units qhs.  Thanks,  Tama Headings RN, MSN, BC-ADM Inpatient Diabetes Coordinator Team Pager [phone] (8a-5p)

## 2019-07-25 NOTE — Progress Notes (Signed)
Physical Therapy Treatment Patient Details Name: Katherine Herrera MRN: [id number] DOB: 09-15-1966 Today's Date: 07/25/2019    History of Present Illness 53 y/o female with history of type 2 diabetes mellitus, Charcot deformity of right foot status post fifth metatarsal resection recently, hypothyroidism, COPD, chronic pain and hyperlipidemia presented with worsened wound over the right foot metatarsal resection that she had about 2 weeks back.  She was sent home on wound VAC and Augmentin but about 6 days back the wound VAC stopped working.  She was seen in the office by her podiatrist on the day of admission and recommended her to be admitted to the hospital for IV antibiotics and surgical intervention. Pt is now s/p Incision and drainage right foot wound below the deep fascia on 07/20/19.    PT Comments    Pt OOB to [facility] and back.  General transfer comment: pt self able from bed to [facility] and back without walker using hands to steady self.  Also able to perform self peri care and stand mostly on L LE to balance self.  General Gait Details: Transfers only this session.  Pt waiting for nurse to change wound VAC to home VAC then go home. Pt will also need an 18 inch wheelchair with elevating leg rests.  Social Worker notified via phone.    Follow Up Recommendations  No PT follow up      Equipment Recommendations  Wheelchair (measurements PT)  Patient suffers from R foot debridement with application of wound vac which impairs their ability to perform daily activities like amb safely in the home.  A walker alone will not resolve the issues with performing activities of daily living. A wheelchair will allow patient to safely perform daily activities.  The patient can self propel in the home or has a caregiver who can provide assistance.     Recommendations for Other Services       Precautions / Restrictions Precautions Precautions: Fall Precaution Comments: CAM boot and wound VAC R  LE Restrictions Weight Bearing Restrictions: No RLE Weight Bearing: Weight bearing as tolerated Other Position/Activity Restrictions: WBAT with CAM boot    Mobility  Bed Mobility Overal bed mobility: Modified Independent             General bed mobility comments: self able in and out bed  Transfers Overall transfer level: Needs assistance Equipment used: None Transfers: Sit to/from [employer] Transfers Sit to Stand: Modified independent (Device/Increase time) Stand pivot transfers: Modified independent (Device/Increase time)       General transfer comment: pt self able from bed to [facility] and back without walker using hands to steady self.  Also able to perform self peri care and stand mostly on L LE to balance self  Ambulation/Gait             General Gait Details: Transfers only this session.  Pt waiting for nurse to change wound VAC to home VAC then go home.   Stairs Stairs: (pt has a ramp)           Wheelchair Mobility    Modified Rankin (Stroke Patients Only)       Balance                                            Cognition Arousal/Alertness: Awake/alert Behavior During Therapy: WFL for tasks assessed/performed Overall Cognitive Status: Within Functional Limits  for tasks assessed                                 General Comments: "this ain't my first rodeo"      Exercises      General Comments        Pertinent Vitals/Pain Pain Assessment: 0-10 Pain Score: 5  Pain Location: R foot with WB'ing in cam boot Pain Descriptors / Indicators: Grimacing;Operative site guarding Pain Intervention(s): Monitored during session;Repositioned;Other (comment)(elevation)    Home Living                      Prior Function            PT Goals (current goals can now be found in the care plan section) Progress towards PT goals: Progressing toward goals    Frequency    Min 3X/week      PT Plan  Current plan remains appropriate    Co-evaluation              AM-PAC PT "6 Clicks" Mobility   Outcome Measure  Help needed turning from your back to your side while in a flat bed without using bedrails?: None Help needed moving from lying on your back to sitting on the side of a flat bed without using bedrails?: None Help needed moving to and from a bed to a chair (including a wheelchair)?: None Help needed standing up from a chair using your arms (e.g., wheelchair or bedside chair)?: None Help needed to walk in hospital room?: None Help needed climbing 3-5 steps with a railing? : A Little 6 Click Score: 23    End of Session   Activity Tolerance: Patient tolerated treatment well Patient left: in bed;with call bell/phone within reach;with nursing/sitter in room;with family/visitor present   PT Visit Diagnosis: Unsteadiness on feet (R26.81);Muscle weakness (generalized) (M62.81);Pain Pain - Right/Left: Right Pain - part of body: Ankle and joints of foot     Time: 1400-1410 PT Time Calculation (min) (ACUTE ONLY): 10 min  Charges:  $Gait Training: 8-22 mins                     Rica Koyanagi  PTA Acute  Rehabilitation Services Pager      [phone] Office      [phone]

## 2019-07-25 NOTE — Consult Note (Signed)
   [facility] CM Inpatient Consult   07/25/2019  Katherine Herrera [birth year] [id number]    Patient screened if potential [facility] Management services needed bythis [employer] memberwith16% risk score for unplanned readmission, with 2 hospitalizations in the past 6 months.  Chart review andMD brief summary, reveal as follows:  Katherine Reading McGowanis a29year oldfemale with history of type 2 diabetes mellitus, Charcot deformity of right foot status post fifth metatarsal resection recently; hypothyroidism, COPD, chronic pain and hyperlipidemia,   who presented with worsened wound over the right foot metatarsal resection that she had about 2 weeksago. She was sent home on wound VAC and Augmentin but about 6 daysago,the wound VAC stopped working. She was seen in the office by her podiatrist on the day of admission and recommended her to be admitted to the hospital for IV antibiotics and surgical intervention.S/PI&D of the right foot by podiatry on 9/19, followed by wound VAC placement on 9/21 and again on 9/23.   Inpatient DM coordinator has been following patient during this hospitalization with recommendations (recent A1c is 7.8). Patient'sprimary care provider is Dr. Lew Herrera with [facility], listed as providing transition of care follow-up.  Discharge plan per transition of care SW note is home with home health services- had [facility] prior to admission and will resume services at discharge (PT, OT and RN). New wound vac arranged with [facility].  Called and spoke with patient over the hospital phone (HIPAA verified) and [facility] care management services discussed with her. She indicated having no issues with medications (managing herself with daughter-Katherine Herrera's assistance); pharmacy (uses [facility], [place]; transportation (provided by daughter or husband); and patient's daughters and grandchildren will take turns to assist with her care  needs when home. Patient is from home alone, however, at discharge, patient's daughters and her husband will be around to provide needed care.  Patient verbalized good understanding of DM and COPD management (smoking cessation). Patient states that her daughter has always been helpful in keeping her compliant with regular monitoring/ recording of blood sugar; adhere to medications, diet restrictions and notifying provider if needed. She reports that she has a new DM provider (Dr. Chalmers Herrera) whom she follows-up with for issues related to DM.   Patient is aware to follow-up with primary care provider post discharge and to notify him of any health concerns that may arise.  Ifthere areany further changesand needs forappropriatecommunity follow-up, please referto [facility] care management.  Of note, [facility] Care Management services does not replace or interfere with any services that are arranged by transition of care case management or social work.   For questions and additional information, please call:  Katherine Herrera, BSN, RN-BC [facility] Liaison Cell: [phone]

## 2019-07-25 NOTE — Discharge Summary (Signed)
Physician Discharge Summary  Katherine Herrera JXB:[id number] DOB: 07-30-1966 DOA: 07/19/2019  PCP: Cassandria Anger, MD  Admit date: 07/19/2019 Discharge date: 07/25/2019  Admitted From: Home Disposition:  Home  Recommendations for Outpatient Follow-up:  1. Follow up with PCP in 1 week to follow up for diabetes  2. Follow up with Dr. March Rummage 9/25 in office 3. Recommended to repeat BMP in 1 week to recheck Cr and K  4. Follow up on wound culture results   Home Health: PT OT RN  Equipment/Devices: Wound Vac    Discharge Condition: Stable CODE STATUS: Full  Diet recommendation: Carb modified   Brief/Interim Summary: Katherine Herrera is a 53 year old female with history of type 2 diabetes mellitus, Charcot deformity of right foot status post fifth metatarsal resection recently, hypothyroidism, COPD, chronic pain and hyperlipidemia presented with worsened wound over the right foot metatarsal resection that she had about 2 weeks ago. She was sent home on wound VAC and Augmentin but about 6 days ago, the wound VAC stopped working. She was seen in the office by her podiatrist on the day of admission and recommended her to be admitted to the hospital for IV antibiotics and surgical intervention. S/p I&D of the right foot by podiatry on 9/19, followed by wound VAC placement on 9/21 and again on 9/23.   Discharge Diagnoses:  Active Problems:   Hypothyroidism   Dyslipidemia   TOBACCO USE DISORDER/SMOKER-SMOKING CESSATION DISCUSSED   FOOT PAIN   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)   Cellulitis and abscess of toe of right foot   Chronic pain   Diabetic foot ulcer ([place])   Polyneuropathy due to type 2 diabetes mellitus ([place])   Right foot ulcer ([place])   Right diabetic foot ulcer, with poor healing -Right foot wound culture 9/19 showed candida  -Right foot culture from 9/23 pending -S/p I&D of the right foot by podiatry on 9/19, followed by wound vac placement on 9/21 -S/p  debridement of right foot wound and wound vac placement 9/23   -Augmentin and diflucan for discharge home   Type 2 diabetes mellitus, uncontrolled with hyperglycemia -A1c of 7.8.  Decrease Lantus dose today due to hypoglycemia this morning, continue SSI   Acute kidney injury  -Baseline Cr 0.97  -Has been unchanged 1.2-1.3 during past week -Repeat BMP in office   Hypothyroidism -Continue Synthroid  Tobacco abuse -Counseled on cessation  Chronic pain -Home meds continued  Hyperkalemia, mild -1 time lasix     Discharge Instructions  Discharge Instructions    Call MD for:  difficulty breathing, headache or visual disturbances   Complete by: As directed    Call MD for:  extreme fatigue   Complete by: As directed    Call MD for:  persistant dizziness or light-headedness   Complete by: As directed    Call MD for:  persistant nausea and vomiting   Complete by: As directed    Call MD for:  redness, tenderness, or signs of infection (pain, swelling, redness, odor or green/yellow discharge around incision site)   Complete by: As directed    Call MD for:  severe uncontrolled pain   Complete by: As directed    Call MD for:  temperature >100.4   Complete by: As directed    Diet Carb Modified   Complete by: As directed    Discharge instructions   Complete by: As directed    You were cared for by a hospitalist during your  hospital stay. If you have any questions about your discharge medications or the care you received while you were in the hospital after you are discharged, you can call the unit and ask to speak with the hospitalist on call if the hospitalist that took care of you is not available. Once you are discharged, your primary care physician will handle any further medical issues. Please note that NO REFILLS for any discharge medications will be authorized once you are discharged, as it is imperative that you return to your primary care physician (or establish a  relationship with a primary care physician if you do not have one) for your aftercare needs so that they can reassess your need for medications and monitor your lab values.   Discharge instructions   Complete by: As directed    STOP LISINOPRIL UNTIL FOLLOW UP WITH PCP AND REPEAT BLOOD WORK.  INSULIN DOSE HAS BEEN DECREASED AT TIME OF DISCHARGE, FOLLOW UP WITH PCP FOR FURTHER DOSING.  KEEP TRACK OF BLOOD SUGARS DAILY.   Increase activity slowly   Complete by: As directed      Allergies as of 07/25/2019      Reactions   Hydromorphone Hcl Itching   Patient has been tolerating Hydromorphone tablets without adverse effect (07/09/19)   Cephalexin Nausea And Vomiting   Demerol  [meperidine Hcl] Rash   Lovastatin Other (See Comments)   Other reaction(s): Unknown (See Comments) Possible myalgia Unknown    Metformin And Related Nausea And Vomiting   Sulfamethoxazole-trimethoprim    Other reaction(s): Unknown DILI, pancreatitis   Crestor [rosuvastatin Calcium]    myalgia   Niacin Other (See Comments)   Unknown    Paroxetine Other (See Comments)   Other reaction(s): Unknown (See Comments) unknown Unknown       Medication List    STOP taking these medications   doxycycline 100 MG tablet Commonly known as: VIBRA-TABS   lisinopril 10 MG tablet Commonly known as: ZESTRIL     TAKE these medications   accu-chek softclix lancets Test two times daily   amoxicillin-clavulanate 875-125 MG tablet Commonly known as: Augmentin Take 1 tablet by mouth 2 (two) times daily for 7 days.   aspirin 325 MG tablet Commonly known as: Bayer Aspirin Take 1 tablet (325 mg total) by mouth daily.   B-D INS SYR ULTRAFINE 1CC/31G 31G X 5/16" 1 ML Misc Generic drug: Insulin Syringe-Needle U-100 USE AS DIRECTED   Insulin Syringe-Needle U-100 31G X 5/16" 0.5 ML Misc Commonly known as: Sure Comfort Insulin Syringe USE AS DIRECTED WITH INSULIN 4 TIMES A DAY   baclofen 10 MG tablet Commonly known as:  LIORESAL Take 2 tablets (20 mg total) by mouth 2 (two) times daily.   cholecalciferol 25 MCG (1000 UT) tablet Commonly known as: VITAMIN D Take 2 tablets (2,000 Units total) by mouth daily.   clonazePAM 0.5 MG tablet Commonly known as: KLONOPIN Take 1 tablet (0.5 mg total) by mouth 2 (two) times daily as needed for anxiety.   ezetimibe 10 MG tablet Commonly known as: ZETIA Take 1 tablet (10 mg total) by mouth daily.   fluconazole 200 MG tablet Commonly known as: DIFLUCAN Take 1 tablet (200 mg total) by mouth daily for 7 days. Start taking on: July 26, 2019   gabapentin 800 MG tablet Commonly known as: NEURONTIN Take 1 tablet (800 mg total) by mouth 4 (four) times daily.   glucose blood test strip Commonly known as: OneTouch Verio Use to check blood sugar 2 times  per day.   HYDROmorphone 4 MG tablet Commonly known as: Dilaudid Take 1.5 tablets (6 mg total) by mouth 5 (five) times daily as needed for severe pain. Every 4 hours as needed for severe pain, not to exceed 5 times daily   insulin NPH-regular Human (70-30) 100 UNIT/ML injection Inject 10 Units into the skin 2 (two) times daily with a meal. What changed:   how much to take  when to take this   lamoTRIgine 25 MG tablet Commonly known as: LAMICTAL TAKE 1 TABLET BY MOUTH TWO  TIMES DAILY   levothyroxine 75 MCG tablet Commonly known as: SYNTHROID TAKE 1 TABLET BY MOUTH  DAILY What changed: when to take this   loratadine 10 MG tablet Commonly known as: CLARITIN Take 10 mg by mouth daily as needed for allergies.   OneTouch Verio w/Device Kit   polyethylene glycol 17 g packet Commonly known as: MIRALAX / GLYCOLAX Take 17 g by mouth daily as needed for moderate constipation.   rOPINIRole 0.5 MG tablet Commonly known as: REQUIP Take 1 tablet (0.5 mg total) by mouth at bedtime.   senna-docusate 8.6-50 MG tablet Commonly known as: Senokot-S Take 1 tablet by mouth 2 (two) times daily. What changed:  when to take this   vitamin B-12 1000 MCG tablet Commonly known as: CYANOCOBALAMIN Take 1,000 mcg by mouth daily.      Follow-up Information    Plotnikov, [name], MD. Schedule an appointment as soon as possible for a visit in 1 week(s).   Specialty: Internal Medicine Contact information: [address] [address] [phone]        Evelina Bucy, DPM. Go on 07/26/2019.   Specialty: Podiatry Contact information: [address] [address] [phone]          Allergies  Allergen Reactions  . Hydromorphone Hcl Itching    Patient has been tolerating Hydromorphone tablets without adverse effect (07/09/19)  . Cephalexin Nausea And Vomiting  . Demerol  [Meperidine Hcl] Rash  . Lovastatin Other (See Comments)    Other reaction(s): Unknown (See Comments) Possible myalgia Unknown   . Metformin And Related Nausea And Vomiting  . Sulfamethoxazole-Trimethoprim     Other reaction(s): Unknown DILI, pancreatitis  . Crestor [Rosuvastatin Calcium]     myalgia  . Niacin Other (See Comments)    Unknown   . Paroxetine Other (See Comments)    Other reaction(s): Unknown (See Comments) unknown Unknown     Consultations:  Podiatry    Procedures/Studies: Mr Foot Right Wo Contrast  Result Date: 07/05/2019 CLINICAL DATA:  Diabetic with right foot ulcer and cellulitis. Limited response to antibiotics. Clinical concern of osteomyelitis. EXAM: MRI OF THE RIGHT FOREFOOT WITHOUT CONTRAST TECHNIQUE: Multiplanar, multisequence MR imaging of the right forefoot was performed. No intravenous contrast was administered. COMPARISON:  Radiographs 06/20/2019, 04/02/2019 and 01/02/2019. FINDINGS: Bones/Joint/Cartilage Patient underwent resection of the 5th metatarsal head on 01/02/2019. The amputation site appears sharp. There is periosteal thickening involving the base of the 5th metatarsal which appears chronic. Low-level marrow T2 hyperintensity is present within the  base of the 5th metatarsal without corresponding T1 signal abnormality or cortical destruction. There are overlying soft tissue changes with inflammation and possible soft tissue emphysema in this area. There is mild T2 hyperintensity laterally in the cuboid without corresponding T1 signal abnormality or cortical destruction. Apparent arthropathy at the 2nd DIP joint. No other focal osseous lesions are identified. There are mild degenerative changes in the midfoot. Ligaments  Intact Lisfranc ligament. Muscles and Tendons Diffuse forefoot muscular atrophy without focal fluid collection. No significant tenosynovitis. Soft tissues There is skin irregularity and ulceration along the plantar and lateral aspect of the 5th metatarsal base. There are inflammatory changes and possible soft tissue emphysema in the subcutaneous fat. There is no well-defined fluid collection. As noted above, the soft tissue changes are adjacent to marrow changes in the base of the 5th metatarsal. IMPRESSION: 1. Soft tissue ulceration along the plantar and lateral aspect of the 5th metatarsal base with underlying inflammatory changes and possible soft tissue emphysema. These findings are in close proximity to T2 marrow changes in the base of the 5th metatarsal, suspicious for early osteomyelitis. There is no cortical destruction. 2. 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 3. Apparent arthropathy at the 2nd DIP joint. Electronically Signed   By: Richardean Sale M.D.   On: 07/05/2019 12:46   Dg Foot 2 Views Right  Result Date: 07/05/2019 CLINICAL DATA:  Status post right fifth metatarsal resection, peroneal tenodesis, ulcer repair EXAM: RIGHT FOOT - 2 VIEW COMPARISON:  06/24/2019 FINDINGS: Redemonstrated postoperative findings of distal right fifth metatarsal amputation with new overlying skin staples about the base of the right fifth metatarsal. There is soft tissue edema about the midfoot and ankle. Mild right first metatarsophalangeal  arthrosis. IMPRESSION: Redemonstrated postoperative findings of distal right fifth metatarsal amputation with new overlying skin staples about the base of the right fifth metatarsal. There is soft tissue edema about the midfoot and ankle. Electronically Signed   By: Eddie Candle M.D.   On: 07/05/2019 16:45   Dg Foot Complete Right  Result Date: 07/22/2019 Please see detailed radiograph report in office note.  Dg Foot Complete Right  Result Date: 07/01/2019 Please see detailed radiograph report in office note.  Vas [place] Burnard Bunting With/wo Tbi  Result Date: 07/21/2019 LOWER EXTREMITY DOPPLER STUDY Indications: Ulceration. High Risk Factors: Diabetes. Other Factors: Post op foot infection worsening even with antibiotics. Admitted                for IV antibiotics and I&D of foot.  Vascular Interventions: 07/05/19 right 5th metatarsal resection. Comparison Study: 09/24/12 WNL Performing Technologist: June Leap Rdms, Rvt  Examination Guidelines: A complete evaluation includes at minimum, Doppler waveform signals and systolic blood pressure reading at the level of bilateral brachial, anterior tibial, and posterior tibial arteries, when vessel segments are accessible. Bilateral testing is considered an integral part of a complete examination. Photoelectric Plethysmograph (PPG) waveforms and toe systolic pressure readings are included as required and additional duplex testing as needed. Limited examinations for reoccurring indications may be performed as noted.  ABI Findings: +---------+------------------+-----+---------+-------------------------+ Right    Rt Pressure (mmHg)IndexWaveform Comment                   +---------+------------------+-----+---------+-------------------------+ Brachial                        triphasicBP not obtained due to IV +---------+------------------+-----+---------+-------------------------+ ATA      148               1.33 biphasic                            +---------+------------------+-----+---------+-------------------------+ PTA      142               1.28 triphasic                          +---------+------------------+-----+---------+-------------------------+  Great Toe142               1.28 Abnormal                           +---------+------------------+-----+---------+-------------------------+ +---------+------------------+-----+---------+-------+ Left     Lt Pressure (mmHg)IndexWaveform Comment +---------+------------------+-----+---------+-------+ Brachial 111                    triphasic        +---------+------------------+-----+---------+-------+ ATA      146               1.32 triphasic        +---------+------------------+-----+---------+-------+ PTA      155               1.40 biphasic         +---------+------------------+-----+---------+-------+ Great Toe141               1.27 Abnormal         +---------+------------------+-----+---------+-------+  Summary: Right: Resting right ankle-brachial index indicates noncompressible right lower extremity arteries. The right toe-brachial index is normal. Left: Resting left ankle-brachial index indicates noncompressible left lower extremity arteries. The left toe-brachial index is normal.  *See table(s) above for measurements and observations.  Electronically signed by Ruta Hinds MD on 07/21/2019 at 11:00:34 AM.    Final    Vas [place] Lower Extremity Venous (dvt)  Result Date: 07/08/2019  Lower Venous Study Indications: Pain. Other Indications: Diabetic ulcer with necrosis of bone, osteomyelitis. Limitations: Body habitus, edema, poor ultrasound/tissue interface and orthopaedic appliance. Comparison Study: Prior negative left lower extremity venous duplex from 05/12/17                   is available for comparison. Performing Technologist: Sharion Dove RVS  Examination Guidelines: A complete evaluation includes B-mode imaging, spectral Doppler, color Doppler, and  power Doppler as needed of all accessible portions of each vessel. Bilateral testing is considered an integral part of a complete examination. Limited examinations for reoccurring indications may be performed as noted.  +---------+---------------+---------+-----------+----------+------------------+ RIGHT    CompressibilityPhasicitySpontaneityPropertiesThrombus Aging     +---------+---------------+---------+-----------+----------+------------------+ CFV      Full           Yes      Yes                                     +---------+---------------+---------+-----------+----------+------------------+ SFJ      Full                                                            +---------+---------------+---------+-----------+----------+------------------+ FV Prox  Full                                                            +---------+---------------+---------+-----------+----------+------------------+ FV Mid   Full                                                            +---------+---------------+---------+-----------+----------+------------------+  FV DistalFull                                                            +---------+---------------+---------+-----------+----------+------------------+ PFV      Full                                                            +---------+---------------+---------+-----------+----------+------------------+ POP                     Yes      Yes                  Patent by color                                                          Doppler            +---------+---------------+---------+-----------+----------+------------------+ PTV                                                   Not visualized     +---------+---------------+---------+-----------+----------+------------------+ PERO                                                  Not visualized      +---------+---------------+---------+-----------+----------+------------------+   +----+---------------+---------+-----------+----------+--------------+ LEFTCompressibilityPhasicitySpontaneityPropertiesThrombus Aging +----+---------------+---------+-----------+----------+--------------+ CFV Full           Yes      Yes                                 +----+---------------+---------+-----------+----------+--------------+     Summary: Right: There is no evidence of deep vein thrombosis in the lower extremity. However, portions of this examination were limited- see technologist comments above. Left: No evidence of common femoral vein obstruction.  *See table(s) above for measurements and observations. Electronically signed by Harold Barban MD on 07/08/2019 at 1:11:38 AM.    Final        Discharge Exam: Vitals:   07/24/19 2044 07/25/19 0439  BP: (!) 160/83 (!) 147/89  Pulse: 66 67  Resp: 18 18  Temp: 98.2 F (36.8 C) 97.7 F (36.5 C)  SpO2: 96% 97%    General: Pt is alert, awake, not in acute distress Cardiovascular: RRR, S1/S2 +, no edema Respiratory: CTA bilaterally, no wheezing, no rhonchi, no respiratory distress, no conversational dyspnea  Abdominal: Soft, NT, ND, bowel sounds + Extremities: right foot in boot and wound vac in place  Psych: Normal mood and affect, stable judgement and insight     The results of significant diagnostics from this hospitalization (including imaging, microbiology, ancillary  and laboratory) are listed below for reference.     Microbiology: Recent Results (from the past 240 hour(s))  SARS CORONAVIRUS 2 (TAT 6-24 HRS) Nasopharyngeal Nasopharyngeal Swab     Status: None   Collection Time: 07/19/19  9:43 PM   Specimen: Nasopharyngeal Swab  Result Value Ref Range Status   SARS Coronavirus 2 NEGATIVE NEGATIVE Final    Comment: (NOTE) SARS-CoV-2 target nucleic acids are NOT DETECTED. The SARS-CoV-2 RNA is generally detectable in upper and  lower respiratory specimens during the acute phase of infection. Negative results do not preclude SARS-CoV-2 infection, do not rule out co-infections with other pathogens, and should not be used as the sole basis for treatment or other patient management decisions. Negative results must be combined with clinical observations, patient history, and epidemiological information. The expected result is Negative. Fact Sheet for Patients: SugarRoll.be Fact Sheet for Healthcare Providers: https://www.woods-mathews.com/ This test is not yet approved or cleared by the [place] FDA and  has been authorized for detection and/or diagnosis of SARS-CoV-2 by FDA under an Emergency Use Authorization (EUA). This EUA will remain  in effect (meaning this test can be used) for the duration of the COVID-19 declaration under Section 56 4(b)(1) of the Act, 21 U.S.C. section 360bbb-3(b)(1), unless the authorization is terminated or revoked sooner. Performed at [facility], [place] [address], [place], [place] [address]   MRSA PCR Screening     Status: None   Collection Time: 07/19/19  9:54 PM   Specimen: Nasal Mucosa; Nasopharyngeal  Result Value Ref Range Status   MRSA by PCR NEGATIVE NEGATIVE Final    Comment:        The GeneXpert MRSA Assay (FDA approved for NASAL specimens only), is one component of a comprehensive MRSA colonization surveillance program. It is not intended to diagnose MRSA infection nor to guide or monitor treatment for MRSA infections. Performed at [facility], [place] [address] [place], [place] [address]   Blood Cultures x 2 sites     Status: None   Collection Time: 07/19/19 11:20 PM   Specimen: BLOOD RIGHT FOREARM  Result Value Ref Range Status   Specimen Description BLOOD RIGHT FOREARM  Final   Special Requests   Final    BOTTLES DRAWN AEROBIC ONLY Blood Culture results may not be optimal due to an  excessive volume of blood received in culture bottles   Culture   Final    NO GROWTH 5 DAYS Performed at [facility], [place] [address] [place], [place] [address]    Report Status 07/25/2019 FINAL  Final  Blood Cultures x 2 sites     Status: None   Collection Time: 07/19/19 11:20 PM   Specimen: BLOOD  Result Value Ref Range Status   Specimen Description   Final    BLOOD RIGHT ANTECUBITAL Performed at [place] [address], [place], [place] [address]    Special Requests   Final    BOTTLES DRAWN AEROBIC ONLY Blood Culture results may not be optimal due to an excessive volume of blood received in culture bottles   Culture   Final    NO GROWTH 5 DAYS Performed at [facility], [place] [address], [place], [place] [address]    Report Status 07/25/2019 FINAL  Final  Aerobic/Anaerobic Culture (surgical/deep wound)     Status: None   Collection Time: 07/20/19  2:40 PM   Specimen: PATH Other; Tissue  Result Value Ref Range Status  Specimen Description   Final    WOUND RIGHT FOOT Performed at [facility], [place] [address], [place], [place] [address]    Special Requests   Final    NONE Performed at [facility], [place] [address], [place], [place] [address]    Gram Stain   Final    RARE WBC PRESENT, PREDOMINANTLY PMN NO ORGANISMS SEEN    Culture   Final    FEW CANDIDA ALBICANS NO ANAEROBES ISOLATED Performed at [facility], [place] [address], [place], [place] [address]    Report Status 07/25/2019 FINAL  Final  Aerobic/Anaerobic Culture (surgical/deep wound)     Status: None (Preliminary result)   Collection Time: 07/24/19  2:10 PM   Specimen: PATH Other; Tissue  Result Value Ref Range Status   Specimen Description   Final    FOOT RT DEEP SWAB Performed at [facility] [address], [place], [place] [address]    Special Requests   Final    NONE Performed at [facility], [place]  [address], [place], [place] [address]    Gram Stain   Final    FEW WBC PRESENT,BOTH PMN AND MONONUCLEAR NO ORGANISMS SEEN    Culture   Final    NO GROWTH < 24 HOURS Performed at [facility], [place] [address] [place], [place] [address]    Report Status PENDING  Incomplete     Labs: BNP (last 3 results) Recent Labs    09/28/18 2112  BNP 93.8   Basic Metabolic Panel: Recent Labs  Lab 07/19/19 2320 07/20/19 0451 07/21/19 0518 07/22/19 0438 07/23/19 0421 07/24/19 0540 07/25/19 0402 07/25/19 0931  NA 139 138  --  134*  --   --  141  --   K 4.2 4.5  --  4.6  --   --  5.7* 5.5*  CL 98 102  --  97*  --   --  104  --   CO2 29 26  --  28  --   --  31  --   GLUCOSE 260* 164*  --  384*  --   --  75  --   BUN 19 23*  --  28*  --   --  25*  --   CREATININE 1.16* 1.11* 1.18* 1.39* 1.23* 1.32* 1.30*  --   CALCIUM 9.1 8.8*  --  8.6*  --   --  8.8*  --   MG  --  2.1  --   --   --   --   --   --   PHOS  --  4.8*  --   --   --   --   --   --    Liver Function Tests: Recent Labs  Lab 07/19/19 2320 07/20/19 0451  AST 13* 11*  ALT 11 10  ALKPHOS 112 95  BILITOT 0.2* 0.4  PROT 7.9 7.0  ALBUMIN 3.4* 2.9*   No results for input(s): LIPASE, AMYLASE in the last 168 hours. No results for input(s): AMMONIA in the last 168 hours. CBC: Recent Labs  Lab 07/19/19 2320 07/20/19 0451 07/22/19 0438 07/25/19 0402  WBC 11.4* 9.9 7.7 9.8  NEUTROABS 6.3  --   --   --   HGB 13.6 12.1 11.7* 11.3*  HCT 43.1 38.7 37.5 36.6  MCV 88.3 88.2 88.2 89.3  PLT 492* 448* 367 312   Cardiac Enzymes: No results for input(s): CKTOTAL,  CKMB, CKMBINDEX, TROPONINI in the last 168 hours. BNP: Invalid input(s): POCBNP CBG: Recent Labs  Lab 07/24/19 2217 07/25/19 0829 07/25/19 0916 07/25/19 0948 07/25/19 1151  GLUCAP 102* 53* 55* 131* 155*   D-Dimer No results for input(s): DDIMER in the last 72 hours. Hgb A1c No results for input(s): HGBA1C in the last 72 hours. Lipid Profile No results  for input(s): CHOL, HDL, LDLCALC, TRIG, CHOLHDL, LDLDIRECT in the last 72 hours. Thyroid function studies No results for input(s): TSH, T4TOTAL, T3FREE, THYROIDAB in the last 72 hours.  Invalid input(s): FREET3 Anemia work up No results for input(s): VITAMINB12, FOLATE, FERRITIN, TIBC, IRON, RETICCTPCT in the last 72 hours. Urinalysis    Component Value Date/Time   COLORURINE YELLOW 02/20/2019 1330   APPEARANCEUR CLEAR 02/20/2019 1330   LABSPEC 1.010 02/20/2019 1330   PHURINE 5.5 02/20/2019 1330   GLUCOSEU NEGATIVE 02/20/2019 1330   HGBUR NEGATIVE 02/20/2019 1330   BILIRUBINUR NEGATIVE 02/20/2019 1330   KETONESUR NEGATIVE 02/20/2019 1330   PROTEINUR NEGATIVE 09/28/2018 2318   UROBILINOGEN 0.2 02/20/2019 1330   NITRITE NEGATIVE 02/20/2019 1330   LEUKOCYTESUR NEGATIVE 02/20/2019 1330   Sepsis Labs Invalid input(s): PROCALCITONIN,  WBC,  LACTICIDVEN Microbiology Recent Results (from the past 240 hour(s))  SARS CORONAVIRUS 2 (TAT 6-24 HRS) Nasopharyngeal Nasopharyngeal Swab     Status: None   Collection Time: 07/19/19  9:43 PM   Specimen: Nasopharyngeal Swab  Result Value Ref Range Status   SARS Coronavirus 2 NEGATIVE NEGATIVE Final    Comment: (NOTE) SARS-CoV-2 target nucleic acids are NOT DETECTED. The SARS-CoV-2 RNA is generally detectable in upper and lower respiratory specimens during the acute phase of infection. Negative results do not preclude SARS-CoV-2 infection, do not rule out co-infections with other pathogens, and should not be used as the sole basis for treatment or other patient management decisions. Negative results must be combined with clinical observations, patient history, and epidemiological information. The expected result is Negative. Fact Sheet for Patients: SugarRoll.be Fact Sheet for Healthcare Providers: https://www.woods-mathews.com/ This test is not yet approved or cleared by the [place] FDA and   has been authorized for detection and/or diagnosis of SARS-CoV-2 by FDA under an Emergency Use Authorization (EUA). This EUA will remain  in effect (meaning this test can be used) for the duration of the COVID-19 declaration under Section 56 4(b)(1) of the Act, 21 U.S.C. section 360bbb-3(b)(1), unless the authorization is terminated or revoked sooner. Performed at [facility], [place] [address] [place], [place] [address]   MRSA PCR Screening     Status: None   Collection Time: 07/19/19  9:54 PM   Specimen: Nasal Mucosa; Nasopharyngeal  Result Value Ref Range Status   MRSA by PCR NEGATIVE NEGATIVE Final    Comment:        The GeneXpert MRSA Assay (FDA approved for NASAL specimens only), is one component of a comprehensive MRSA colonization surveillance program. It is not intended to diagnose MRSA infection nor to guide or monitor treatment for MRSA infections. Performed at [facility], [place] [address], [place], [place] [address]   Blood Cultures x 2 sites     Status: None   Collection Time: 07/19/19 11:20 PM   Specimen: BLOOD RIGHT FOREARM  Result Value Ref Range Status   Specimen Description BLOOD RIGHT FOREARM  Final   Special Requests   Final    BOTTLES DRAWN AEROBIC ONLY Blood Culture results may not be optimal due to an excessive volume of blood  received in culture bottles   Culture   Final    NO GROWTH 5 DAYS Performed at [facility], [place] [address], [place], [place] [address]    Report Status 07/25/2019 FINAL  Final  Blood Cultures x 2 sites     Status: None   Collection Time: 07/19/19 11:20 PM   Specimen: BLOOD  Result Value Ref Range Status   Specimen Description   Final    BLOOD RIGHT ANTECUBITAL Performed at [place] [address], [place], [place] [address]    Special Requests   Final    BOTTLES DRAWN AEROBIC ONLY Blood Culture results may not be optimal due to an excessive volume of blood received in  culture bottles   Culture   Final    NO GROWTH 5 DAYS Performed at [facility], [place] [address] [place], [place] [address]    Report Status 07/25/2019 FINAL  Final  Aerobic/Anaerobic Culture (surgical/deep wound)     Status: None   Collection Time: 07/20/19  2:40 PM   Specimen: PATH Other; Tissue  Result Value Ref Range Status   Specimen Description   Final    WOUND RIGHT FOOT Performed at [facility], [place] [address], [place], [place] [address]    Special Requests   Final    NONE Performed at [facility], [place] [address], [place], [place] [address]    Gram Stain   Final    RARE WBC PRESENT, PREDOMINANTLY PMN NO ORGANISMS SEEN    Culture   Final    FEW CANDIDA ALBICANS NO ANAEROBES ISOLATED Performed at [facility], [place] [address], [place], [place] [address]    Report Status 07/25/2019 FINAL  Final  Aerobic/Anaerobic Culture (surgical/deep wound)     Status: None (Preliminary result)   Collection Time: 07/24/19  2:10 PM   Specimen: PATH Other; Tissue  Result Value Ref Range Status   Specimen Description   Final    FOOT RT DEEP SWAB Performed at [place] [address], [place], [place] [address]    Special Requests   Final    NONE Performed at [facility], [place] [address] [place], [place] [address]    Gram Stain   Final    FEW WBC PRESENT,BOTH PMN AND MONONUCLEAR NO ORGANISMS SEEN    Culture   Final    NO GROWTH < 24 HOURS Performed at [facility], [place] [address] [place],  [address]    Report Status PENDING  Incomplete     Patient was seen and examined on the day of discharge and was found to be in stable condition. Time coordinating discharge: 25 minutes including assessment and coordination of care, as well as examination of the patient.   SIGNED:  Dessa Phi, DO [facility] [email or web address] 07/25/2019, 12:54 PM

## 2019-07-25 NOTE — Progress Notes (Signed)
PHYSICAL THERAPY   Pt will need an 18 inch wheelchair with elevating leg rests  Patient suffers from R foot debridement with application of wound vac which impairs their ability to perform daily activities like amb safely in the home.  A walker alone will not resolve the issues with performing activities of daily living. A wheelchair will allow patient to safely perform daily activities.  The patient can self propel in the home or has a caregiver who can provide assistance.     Rica Koyanagi  PTA Acute  Rehabilitation Services Pager      [phone] Office      [phone]

## 2019-07-26 ENCOUNTER — Telehealth: Payer: Self-pay | Admitting: *Deleted

## 2019-07-26 ENCOUNTER — Ambulatory Visit (INDEPENDENT_AMBULATORY_CARE_PROVIDER_SITE_OTHER): Payer: Medicare Other | Admitting: Podiatry

## 2019-07-26 ENCOUNTER — Other Ambulatory Visit: Payer: Self-pay

## 2019-07-26 DIAGNOSIS — E08621 Diabetes mellitus due to underlying condition with foot ulcer: Secondary | ICD-10-CM

## 2019-07-26 DIAGNOSIS — L97413 Non-pressure chronic ulcer of right heel and midfoot with necrosis of muscle: Secondary | ICD-10-CM | POA: Diagnosis not present

## 2019-07-26 MED ORDER — HYDROMORPHONE HCL 4 MG PO TABS: 6.0000 mg | ORAL_TABLET | Freq: Every day | ORAL | 0 refills | Status: DC | PRN

## 2019-07-26 NOTE — Telephone Encounter (Signed)
Pt was on TCM report admitted 07/19/19 for IV antibiotics and surgical intervention.S/pI&D of the right foot by podiatry on 9/19, followed by wound VAC placement on 9/21 and again on 9/23. pt D/C 07/25/19, and will follow-up w/podiatrist on 07/26/2019.Katherine KitchenJohny Chess

## 2019-07-26 NOTE — Telephone Encounter (Signed)
Orders given to Candy Sledge for nurse to come out and change wound VAC on Mondays and Wednesdays, patient is coming to the office on Fridays for wound VAC change.

## 2019-07-27 DIAGNOSIS — E11621 Type 2 diabetes mellitus with foot ulcer: Secondary | ICD-10-CM | POA: Diagnosis not present

## 2019-07-27 DIAGNOSIS — Z4781 Encounter for orthopedic aftercare following surgical amputation: Secondary | ICD-10-CM | POA: Diagnosis not present

## 2019-07-27 DIAGNOSIS — L03115 Cellulitis of right lower limb: Secondary | ICD-10-CM | POA: Diagnosis not present

## 2019-07-27 DIAGNOSIS — E1142 Type 2 diabetes mellitus with diabetic polyneuropathy: Secondary | ICD-10-CM | POA: Diagnosis not present

## 2019-07-27 DIAGNOSIS — L97518 Non-pressure chronic ulcer of other part of right foot with other specified severity: Secondary | ICD-10-CM | POA: Diagnosis not present

## 2019-07-28 NOTE — Progress Notes (Signed)
Subjective:  Patient ID: Katherine Herrera, female    DOB: 12-27-1965,  MRN: [id number]  Chief Complaint  Patient presents with  . Wound Check    wound check f/u on ulcer right, pt is concerned that it might be infected, pt states that it is feeling red and tender, and that her legs are starting to swell as well   53 y.o. female returns for post-op check.  History above confirmed with patient  Review of Systems: Negative except as noted in the HPI. Denies N/V/F/Ch.  Past Medical History:  Diagnosis Date  . Anxiety   . COPD (chronic obstructive pulmonary disease) ([place])   . DM (diabetes mellitus) ([place])    type 2  . Family history of adverse reaction to anesthesia    mother hard to wake up  . Gout    possible  . Hyperlipidemia   . Hypothyroidism   . LBP (low back pain)    DR Patrice Paradise  . Migraine   . Peripheral neuropathy   . Syncope    poss conversion disorder vs psychogenic seizures  . Tachycardia    diet coke overuse?  Marland Kitchen Tenosynovitis of foot and ankle 09/03/2013  . TTS (tarsal tunnel syndrome) 2010   Left Foot Vogler in [place]  . Vitamin D deficiency   . Wound, open    both feet outer parts no drainage, changes dressing 1 day, wounds dime size for last month    Current Outpatient Medications:  .  aspirin (BAYER ASPIRIN) 325 MG tablet, Take 1 tablet (325 mg total) by mouth daily., Disp: 100 tablet, Rfl: 3 .  B-D INS SYR ULTRAFINE 1CC/31G 31G X 5/16" 1 ML MISC, USE AS DIRECTED, Disp: 100 each, Rfl: 0 .  baclofen (LIORESAL) 10 MG tablet, Take 2 tablets (20 mg total) by mouth 2 (two) times daily., Disp: 180 tablet, Rfl: 1 .  Blood Glucose Monitoring Suppl (ONETOUCH VERIO) w/Device KIT, , Disp: , Rfl:  .  cholecalciferol (VITAMIN D) 25 MCG (1000 UT) tablet, Take 2 tablets (2,000 Units total) by mouth daily., Disp: 100 tablet, Rfl: 3 .  ezetimibe (ZETIA) 10 MG tablet, Take 1 tablet (10 mg total) by mouth daily., Disp: 90 tablet, Rfl: 3 .  gabapentin (NEURONTIN) 800 MG tablet, Take 1  tablet (800 mg total) by mouth 4 (four) times daily., Disp: 360 tablet, Rfl: 1 .  glucose blood (ONETOUCH VERIO) test strip, Use to check blood sugar 2 times per day., Disp: 100 each, Rfl: 12 .  Insulin Syringe-Needle U-100 (SURE COMFORT INSULIN SYRINGE) 31G X 5/16" 0.5 ML MISC, USE AS DIRECTED WITH INSULIN 4 TIMES A DAY, Disp: 400 each, Rfl: 2 .  lamoTRIgine (LAMICTAL) 25 MG tablet, TAKE 1 TABLET BY MOUTH TWO  TIMES DAILY (Patient taking differently: Take 25 mg by mouth 2 (two) times daily. ), Disp: 180 tablet, Rfl: 1 .  Lancet Devices (ACCU-CHEK SOFTCLIX) lancets, Test two times daily, Disp: 100 each, Rfl: 12 .  levothyroxine (SYNTHROID, LEVOTHROID) 75 MCG tablet, TAKE 1 TABLET BY MOUTH  DAILY (Patient taking differently: Take 75 mcg by mouth daily before breakfast. ), Disp: 90 tablet, Rfl: 3 .  loratadine (CLARITIN) 10 MG tablet, Take 10 mg by mouth daily as needed for allergies., Disp: , Rfl:  .  rOPINIRole (REQUIP) 0.5 MG tablet, Take 1 tablet (0.5 mg total) by mouth at bedtime., Disp: 90 tablet, Rfl: 1 .  vitamin B-12 (CYANOCOBALAMIN) 1000 MCG tablet, Take 1,000 mcg by mouth daily.  , Disp: , Rfl:  .  amoxicillin-clavulanate (AUGMENTIN) 875-125 MG tablet, Take 1 tablet by mouth 2 (two) times daily for 7 days., Disp: 14 tablet, Rfl: 0 .  clonazePAM (KLONOPIN) 0.5 MG tablet, Take 1 tablet (0.5 mg total) by mouth 2 (two) times daily as needed for anxiety., Disp: 30 tablet, Rfl: 1 .  fluconazole (DIFLUCAN) 200 MG tablet, Take 1 tablet (200 mg total) by mouth daily for 7 days., Disp: 7 tablet, Rfl: 0 .  HYDROmorphone (DILAUDID) 4 MG tablet, Take 1.5 tablets (6 mg total) by mouth 5 (five) times daily as needed for severe pain. Every 4 hours as needed for severe pain, not to exceed 5 times daily, Disp: 42 tablet, Rfl: 0 .  insulin NPH-regular Human (70-30) 100 UNIT/ML injection, Inject 10 Units into the skin 2 (two) times daily with a meal., Disp: 10 mL, Rfl: 11 .  polyethylene glycol (MIRALAX /  GLYCOLAX) 17 g packet, Take 17 g by mouth daily as needed for moderate constipation., Disp: 14 each, Rfl: 0 .  senna-docusate (SENOKOT-S) 8.6-50 MG tablet, Take 1 tablet by mouth 2 (two) times daily. (Patient taking differently: Take 1 tablet by mouth at bedtime. ), Disp: 20 tablet, Rfl: 0  Social History   Tobacco Use  Smoking Status Current Every Day Smoker  . Packs/day: 1.00  Smokeless Tobacco Never Used    Allergies  Allergen Reactions  . Hydromorphone Hcl Itching    Patient has been tolerating Hydromorphone tablets without adverse effect (07/09/19)  . Cephalexin Nausea And Vomiting  . Demerol  [Meperidine Hcl] Rash  . Lovastatin Other (See Comments)    Other reaction(s): Unknown (See Comments) Possible myalgia Unknown   . Metformin And Related Nausea And Vomiting  . Sulfamethoxazole-Trimethoprim     Other reaction(s): Unknown DILI, pancreatitis  . Crestor [Rosuvastatin Calcium]     myalgia  . Niacin Other (See Comments)    Unknown   . Paroxetine Other (See Comments)    Other reaction(s): Unknown (See Comments) unknown Unknown    Objective:   Vitals:   06/28/19 1439  Temp: 98.1 F (36.7 C)   There is no height or weight on file to calculate BMI. Constitutional Well developed. Well nourished.  Vascular Foot warm and well perfused. Capillary refill normal to all digits.   Neurologic Normal speech. Oriented to person, place, and time. Epicritic sensation to light touch grossly present bilaterally.  Dermatologic 5th MPJ sites well healed bilateral foot. 5th met base wound with hyperkeratosis, granular wound base. No warmth, erythema, signs of infection.  Right leg edema noted  Orthopedic: Tenderness to palpation noted about the left 5th met base.  Cavus foot type right   Radiographs: X-rays taken reviewed no acute fracture dislocations no signs of acute osteomyelitis Assessment:   1. Skin ulcer of right foot, limited to breakdown of skin ([place])   2. Cellulitis of  foot, right    Plan:  Patient was evaluated and treated and all questions answered.  Right 5th Met Base wound with mild cellulitis -Wound debrided as below -Rx Clinda Cipro -X-rays without signs of osteomyelitis  Procedure: Selective Debridement of Wound Rationale: Removal of devitalized tissue from the wound to promote healing.  Pre-Debridement Wound Measurements: 1 cm x 1 cm x 0.2 cm  Post-Debridement Wound Measurements: same as pre-debridement. Type of Debridement: sharp selective Tissue Removed: Devitalized soft-tissue Dressing: Dry, sterile, compression dressing. Disposition: Patient tolerated procedure well. Patient to return in 1 week for follow-up.     No follow-ups on file.

## 2019-07-29 DIAGNOSIS — E1142 Type 2 diabetes mellitus with diabetic polyneuropathy: Secondary | ICD-10-CM | POA: Diagnosis not present

## 2019-07-29 DIAGNOSIS — L97518 Non-pressure chronic ulcer of other part of right foot with other specified severity: Secondary | ICD-10-CM | POA: Diagnosis not present

## 2019-07-29 DIAGNOSIS — Z4781 Encounter for orthopedic aftercare following surgical amputation: Secondary | ICD-10-CM | POA: Diagnosis not present

## 2019-07-29 DIAGNOSIS — L03115 Cellulitis of right lower limb: Secondary | ICD-10-CM | POA: Diagnosis not present

## 2019-07-29 DIAGNOSIS — E11621 Type 2 diabetes mellitus with foot ulcer: Secondary | ICD-10-CM | POA: Diagnosis not present

## 2019-07-29 LAB — AEROBIC/ANAEROBIC CULTURE W GRAM STAIN (SURGICAL/DEEP WOUND)

## 2019-07-29 NOTE — Progress Notes (Signed)
Subjective:  Patient ID: Katherine Herrera, female    DOB: Feb 18, 1966,  MRN: [id number]  No chief complaint on file.   53 y.o. female presents for wound care. States the wound VAC was giving a blockage alert. Denies other concerns.   Review of Systems: Negative except as noted in the HPI. Denies N/V/F/Ch.  Past Medical History:  Diagnosis Date  . Anxiety   . COPD (chronic obstructive pulmonary disease) ([place])   . DM (diabetes mellitus) ([place])    type 2  . Family history of adverse reaction to anesthesia    mother hard to wake up  . Gout    possible  . Hyperlipidemia   . Hypothyroidism   . LBP (low back pain)    DR Patrice Paradise  . Migraine   . Peripheral neuropathy   . Syncope    poss conversion disorder vs psychogenic seizures  . Tachycardia    diet coke overuse?  Marland Kitchen Tenosynovitis of foot and ankle 09/03/2013  . TTS (tarsal tunnel syndrome) 2010   Left Foot Vog[place] in [place]  . Vitamin D deficiency   . Wound, open    both feet outer parts no drainage, changes dressing 1 day, wounds dime size for last month    Current Outpatient Medications:  .  amoxicillin-clavulanate (AUGMENTIN) 875-125 MG tablet, Take 1 tablet by mouth 2 (two) times daily for 7 days., Disp: 14 tablet, Rfl: 0 .  aspirin (BAYER ASPIRIN) 325 MG tablet, Take 1 tablet (325 mg total) by mouth daily., Disp: 100 tablet, Rfl: 3 .  B-D INS SYR ULTRAFINE 1CC/31G 31G X 5/16" 1 ML MISC, USE AS DIRECTED, Disp: 100 each, Rfl: 0 .  baclofen (LIORESAL) 10 MG tablet, Take 2 tablets (20 mg total) by mouth 2 (two) times daily., Disp: 180 tablet, Rfl: 1 .  Blood Glucose Monitoring Suppl (ONETOUCH VERIO) w/Device KIT, , Disp: , Rfl:  .  cholecalciferol (VITAMIN D) 25 MCG (1000 UT) tablet, Take 2 tablets (2,000 Units total) by mouth daily., Disp: 100 tablet, Rfl: 3 .  clonazePAM (KLONOPIN) 0.5 MG tablet, Take 1 tablet (0.5 mg total) by mouth 2 (two) times daily as needed for anxiety., Disp: 30 tablet, Rfl: 1 .  ezetimibe (ZETIA) 10 MG  tablet, Take 1 tablet (10 mg total) by mouth daily., Disp: 90 tablet, Rfl: 3 .  fluconazole (DIFLUCAN) 200 MG tablet, Take 1 tablet (200 mg total) by mouth daily for 7 days., Disp: 7 tablet, Rfl: 0 .  gabapentin (NEURONTIN) 800 MG tablet, Take 1 tablet (800 mg total) by mouth 4 (four) times daily., Disp: 360 tablet, Rfl: 1 .  glucose blood (ONETOUCH VERIO) test strip, Use to check blood sugar 2 times per day., Disp: 100 each, Rfl: 12 .  HYDROmorphone (DILAUDID) 4 MG tablet, Take 1.5 tablets (6 mg total) by mouth 5 (five) times daily as needed for severe pain. Every 4 hours as needed for severe pain, not to exceed 5 times daily, Disp: 42 tablet, Rfl: 0 .  insulin NPH-regular Human (70-30) 100 UNIT/ML injection, Inject 10 Units into the skin 2 (two) times daily with a meal., Disp: 10 mL, Rfl: 11 .  Insulin Syringe-Needle U-100 (SURE COMFORT INSULIN SYRINGE) 31G X 5/16" 0.5 ML MISC, USE AS DIRECTED WITH INSULIN 4 TIMES A DAY, Disp: 400 each, Rfl: 2 .  lamoTRIgine (LAMICTAL) 25 MG tablet, TAKE 1 TABLET BY MOUTH TWO  TIMES DAILY (Patient taking differently: Take 25 mg by mouth 2 (two) times daily. ), Disp: 180 tablet,  Rfl: 1 .  Lancet Devices (ACCU-CHEK SOFTCLIX) lancets, Test two times daily, Disp: 100 each, Rfl: 12 .  levothyroxine (SYNTHROID, LEVOTHROID) 75 MCG tablet, TAKE 1 TABLET BY MOUTH  DAILY (Patient taking differently: Take 75 mcg by mouth daily before breakfast. ), Disp: 90 tablet, Rfl: 3 .  loratadine (CLARITIN) 10 MG tablet, Take 10 mg by mouth daily as needed for allergies., Disp: , Rfl:  .  polyethylene glycol (MIRALAX / GLYCOLAX) 17 g packet, Take 17 g by mouth daily as needed for moderate constipation., Disp: 14 each, Rfl: 0 .  rOPINIRole (REQUIP) 0.5 MG tablet, Take 1 tablet (0.5 mg total) by mouth at bedtime., Disp: 90 tablet, Rfl: 1 .  senna-docusate (SENOKOT-S) 8.6-50 MG tablet, Take 1 tablet by mouth 2 (two) times daily. (Patient taking differently: Take 1 tablet by mouth at bedtime.  ), Disp: 20 tablet, Rfl: 0 .  vitamin B-12 (CYANOCOBALAMIN) 1000 MCG tablet, Take 1,000 mcg by mouth daily.  , Disp: , Rfl:   Social History   Tobacco Use  Smoking Status Current Every Day Smoker  . Packs/day: 1.00  Smokeless Tobacco Never Used    Allergies  Allergen Reactions  . Hydromorphone Hcl Itching    Patient has been tolerating Hydromorphone tablets without adverse effect (07/09/19)  . Cephalexin Nausea And Vomiting  . Demerol  [Meperidine Hcl] Rash  . Lovastatin Other (See Comments)    Other reaction(s): Unknown (See Comments) Possible myalgia Unknown   . Metformin And Related Nausea And Vomiting  . Sulfamethoxazole-Trimethoprim     Other reaction(s): Unknown DILI, pancreatitis  . Crestor [Rosuvastatin Calcium]     myalgia  . Niacin Other (See Comments)    Unknown   . Paroxetine Other (See Comments)    Other reaction(s): Unknown (See Comments) unknown Unknown    Objective:  There were no vitals filed for this visit. There is no height or weight on file to calculate BMI. Constitutional Well developed. Well nourished.  Vascular Dorsalis pedis pulses palpable bilaterally. Posterior tibial pulses palpable bilaterally. Capillary refill normal to all digits.  No cyanosis or clubbing noted. Pedal hair growth normal.  Neurologic Normal speech. Oriented to person, place, and time. Protective sensation absent  Dermatologic Wound Location: Right 5th met lateral Wound Base: Granular/Healthy Peri-wound: Macerated Exudate: Scant/small amount Serosanguinous exudate Wound Measurements: -2.5x2  Orthopedic: No pain to palpation either foot.   Radiographs: None Assessment:   1. 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  Ulcer right foot -Wound improving. Wound VAC reapplied. Good seal noted. -Continue Abx and  antifungal. -HHC for wound VAC changes twice weekly, will perform in office on Fridays.  Procedure: Wound VAC Application Location: right lateral foot Wound Measurement: 2.5 cm x 2 cm x 0.4 cm  Technique: Black foam to wound base, followed by adherent dressing. Set to 125 mmHg with good seal noted. Disposition: Patient tolerated procedure well.   Return in about 1 week (around 08/02/2019) for Wound VAC change.

## 2019-07-30 DIAGNOSIS — E11621 Type 2 diabetes mellitus with foot ulcer: Secondary | ICD-10-CM | POA: Diagnosis not present

## 2019-07-30 DIAGNOSIS — L03115 Cellulitis of right lower limb: Secondary | ICD-10-CM | POA: Diagnosis not present

## 2019-07-30 DIAGNOSIS — Z4781 Encounter for orthopedic aftercare following surgical amputation: Secondary | ICD-10-CM | POA: Diagnosis not present

## 2019-07-30 DIAGNOSIS — E1142 Type 2 diabetes mellitus with diabetic polyneuropathy: Secondary | ICD-10-CM | POA: Diagnosis not present

## 2019-07-30 DIAGNOSIS — L97518 Non-pressure chronic ulcer of other part of right foot with other specified severity: Secondary | ICD-10-CM | POA: Diagnosis not present

## 2019-07-31 ENCOUNTER — Telehealth: Payer: Self-pay | Admitting: Internal Medicine

## 2019-07-31 NOTE — Telephone Encounter (Signed)
Don calling with [facility] called and stated that he would like verbals OT   1x1 0x1 1x1 0x2 1x1. For ADL IADL Transfers and exercise.

## 2019-07-31 NOTE — Progress Notes (Signed)
Subjective:  Patient ID: Katherine Herrera, female    DOB: [birth year],  MRN: [id number]  No chief complaint on file.  53 y.o. female returns for post-op check.  Still having a lot of pain but states that it is getting somewhat better wound VAC has been doing okay without issues.  Review of Systems: Negative except as noted in the HPI. Denies N/V/F/Ch.  Past Medical History:  Diagnosis Date  . Anxiety   . COPD (chronic obstructive pulmonary disease) ([place])   . DM (diabetes mellitus) ([place])    type 2  . Family history of adverse reaction to anesthesia    mother hard to wake up  . Gout    possible  . Hyperlipidemia   . Hypothyroidism   . LBP (low back pain)    DR Patrice Paradise  . Migraine   . Peripheral neuropathy   . Syncope    poss conversion disorder vs psychogenic seizures  . Tachycardia    diet coke overuse?  Marland Kitchen Tenosynovitis of foot and ankle 09/03/2013  . TTS (tarsal tunnel syndrome) 2010   Left Foot Vogler in [place]  . Vitamin D deficiency   . Wound, open    both feet outer parts no drainage, changes dressing 1 day, wounds dime size for last month    Current Outpatient Medications:  .  amoxicillin-clavulanate (AUGMENTIN) 875-125 MG tablet, Take 1 tablet by mouth 2 (two) times daily for 7 days., Disp: 14 tablet, Rfl: 0 .  aspirin (BAYER ASPIRIN) 325 MG tablet, Take 1 tablet (325 mg total) by mouth daily., Disp: 100 tablet, Rfl: 3 .  B-D INS SYR ULTRAFINE 1CC/31G 31G X 5/16" 1 ML MISC, USE AS DIRECTED, Disp: 100 each, Rfl: 0 .  baclofen (LIORESAL) 10 MG tablet, Take 2 tablets (20 mg total) by mouth 2 (two) times daily., Disp: 180 tablet, Rfl: 1 .  Blood Glucose Monitoring Suppl (ONETOUCH VERIO) w/Device KIT, , Disp: , Rfl:  .  cholecalciferol (VITAMIN D) 25 MCG (1000 UT) tablet, Take 2 tablets (2,000 Units total) by mouth daily., Disp: 100 tablet, Rfl: 3 .  clonazePAM (KLONOPIN) 0.5 MG tablet, Take 1 tablet (0.5 mg total) by mouth 2 (two) times daily as needed for anxiety., Disp: 30  tablet, Rfl: 1 .  ezetimibe (ZETIA) 10 MG tablet, Take 1 tablet (10 mg total) by mouth daily., Disp: 90 tablet, Rfl: 3 .  fluconazole (DIFLUCAN) 200 MG tablet, Take 1 tablet (200 mg total) by mouth daily for 7 days., Disp: 7 tablet, Rfl: 0 .  gabapentin (NEURONTIN) 800 MG tablet, Take 1 tablet (800 mg total) by mouth 4 (four) times daily., Disp: 360 tablet, Rfl: 1 .  glucose blood (ONETOUCH VERIO) test strip, Use to check blood sugar 2 times per day., Disp: 100 each, Rfl: 12 .  HYDROmorphone (DILAUDID) 4 MG tablet, Take 1.5 tablets (6 mg total) by mouth 5 (five) times daily as needed for severe pain. Every 4 hours as needed for severe pain, not to exceed 5 times daily, Disp: 42 tablet, Rfl: 0 .  insulin NPH-regular Human (70-30) 100 UNIT/ML injection, Inject 10 Units into the skin 2 (two) times daily with a meal., Disp: 10 mL, Rfl: 11 .  Insulin Syringe-Needle U-100 (SURE COMFORT INSULIN SYRINGE) 31G X 5/16" 0.5 ML MISC, USE AS DIRECTED WITH INSULIN 4 TIMES A DAY, Disp: 400 each, Rfl: 2 .  lamoTRIgine (LAMICTAL) 25 MG tablet, TAKE 1 TABLET BY MOUTH TWO  TIMES DAILY (Patient taking differently: Take 25 mg by  mouth 2 (two) times daily. ), Disp: 180 tablet, Rfl: 1 .  Lancet Devices (ACCU-CHEK SOFTCLIX) lancets, Test two times daily, Disp: 100 each, Rfl: 12 .  levothyroxine (SYNTHROID, LEVOTHROID) 75 MCG tablet, TAKE 1 TABLET BY MOUTH  DAILY (Patient taking differently: Take 75 mcg by mouth daily before breakfast. ), Disp: 90 tablet, Rfl: 3 .  loratadine (CLARITIN) 10 MG tablet, Take 10 mg by mouth daily as needed for allergies., Disp: , Rfl:  .  polyethylene glycol (MIRALAX / GLYCOLAX) 17 g packet, Take 17 g by mouth daily as needed for moderate constipation., Disp: 14 each, Rfl: 0 .  rOPINIRole (REQUIP) 0.5 MG tablet, Take 1 tablet (0.5 mg total) by mouth at bedtime., Disp: 90 tablet, Rfl: 1 .  senna-docusate (SENOKOT-S) 8.6-50 MG tablet, Take 1 tablet by mouth 2 (two) times daily. (Patient taking  differently: Take 1 tablet by mouth at bedtime. ), Disp: 20 tablet, Rfl: 0 .  vitamin B-12 (CYANOCOBALAMIN) 1000 MCG tablet, Take 1,000 mcg by mouth daily.  , Disp: , Rfl:   Social History   Tobacco Use  Smoking Status Current Every Day Smoker  . Packs/day: 1.00  Smokeless Tobacco Never Used    Allergies  Allergen Reactions  . Hydromorphone Hcl Itching    Patient has been tolerating Hydromorphone tablets without adverse effect (07/09/19)  . Cephalexin Nausea And Vomiting  . Demerol  [Meperidine Hcl] Rash  . Lovastatin Other (See Comments)    Other reaction(s): Unknown (See Comments) Possible myalgia Unknown   . Metformin And Related Nausea And Vomiting  . Sulfamethoxazole-Trimethoprim     Other reaction(s): Unknown DILI, pancreatitis  . Crestor [Rosuvastatin Calcium]     myalgia  . Niacin Other (See Comments)    Unknown   . Paroxetine Other (See Comments)    Other reaction(s): Unknown (See Comments) unknown Unknown    Objective:   There were no vitals filed for this visit. There is no height or weight on file to calculate BMI. Constitutional Well developed. Well nourished.  Vascular Foot warm and well perfused. Capillary refill normal to all digits.   Neurologic Normal speech. Oriented to person, place, and time. Epicritic sensation to light touch grossly present bilaterally.  Dermatologic  incision right foot healing well with slight maceration no warmth no erythema no signs of acute infection  Orthopedic: Tenderness to palpation noted about the left 5th met base.  Cavus foot type right   Radiographs: X-rays taken reviewed no acute fracture dislocations no signs of acute osteomyelitis Assessment:   1. Skin ulcer of right foot, limited to breakdown of skin [facility])    Plan:  Patient was evaluated and treated and all questions answered.  Status post right fifth metatarsal resection with peroneal tenodesis -Wound healing well wound VAC reapplied with fresh  incisional wound VAC dressing.  Follow-up in 1 week for recheck continue antibiotics.  Will send Rx for pain medication  Return in about 1 week (around 07/19/2019) for Post-op.

## 2019-08-01 DIAGNOSIS — E11621 Type 2 diabetes mellitus with foot ulcer: Secondary | ICD-10-CM | POA: Diagnosis not present

## 2019-08-01 DIAGNOSIS — L03115 Cellulitis of right lower limb: Secondary | ICD-10-CM | POA: Diagnosis not present

## 2019-08-01 DIAGNOSIS — L97518 Non-pressure chronic ulcer of other part of right foot with other specified severity: Secondary | ICD-10-CM | POA: Diagnosis not present

## 2019-08-01 DIAGNOSIS — E1142 Type 2 diabetes mellitus with diabetic polyneuropathy: Secondary | ICD-10-CM | POA: Diagnosis not present

## 2019-08-01 DIAGNOSIS — Z4781 Encounter for orthopedic aftercare following surgical amputation: Secondary | ICD-10-CM | POA: Diagnosis not present

## 2019-08-02 ENCOUNTER — Other Ambulatory Visit: Payer: Self-pay

## 2019-08-02 ENCOUNTER — Ambulatory Visit: Payer: Medicare Other | Admitting: Podiatry

## 2019-08-02 DIAGNOSIS — E08621 Diabetes mellitus due to underlying condition with foot ulcer: Secondary | ICD-10-CM

## 2019-08-02 DIAGNOSIS — L97413 Non-pressure chronic ulcer of right heel and midfoot with necrosis of muscle: Secondary | ICD-10-CM

## 2019-08-02 NOTE — Telephone Encounter (Signed)
Orders faxed to [facility] to d/c wound vac. Dressing changes Monday and Wednesday: Cleanse with NS, apply Prisma to wound, cover with 4x4 gauze, kerlix and ace.

## 2019-08-02 NOTE — Telephone Encounter (Signed)
Verbals given, FYI 

## 2019-08-05 DIAGNOSIS — E11621 Type 2 diabetes mellitus with foot ulcer: Secondary | ICD-10-CM | POA: Diagnosis not present

## 2019-08-05 DIAGNOSIS — L03115 Cellulitis of right lower limb: Secondary | ICD-10-CM | POA: Diagnosis not present

## 2019-08-05 DIAGNOSIS — E1142 Type 2 diabetes mellitus with diabetic polyneuropathy: Secondary | ICD-10-CM | POA: Diagnosis not present

## 2019-08-05 DIAGNOSIS — L97518 Non-pressure chronic ulcer of other part of right foot with other specified severity: Secondary | ICD-10-CM | POA: Diagnosis not present

## 2019-08-05 DIAGNOSIS — Z4781 Encounter for orthopedic aftercare following surgical amputation: Secondary | ICD-10-CM | POA: Diagnosis not present

## 2019-08-06 DIAGNOSIS — E11621 Type 2 diabetes mellitus with foot ulcer: Secondary | ICD-10-CM | POA: Diagnosis not present

## 2019-08-06 DIAGNOSIS — E1142 Type 2 diabetes mellitus with diabetic polyneuropathy: Secondary | ICD-10-CM | POA: Diagnosis not present

## 2019-08-06 DIAGNOSIS — Z4781 Encounter for orthopedic aftercare following surgical amputation: Secondary | ICD-10-CM | POA: Diagnosis not present

## 2019-08-06 DIAGNOSIS — L97518 Non-pressure chronic ulcer of other part of right foot with other specified severity: Secondary | ICD-10-CM | POA: Diagnosis not present

## 2019-08-06 DIAGNOSIS — L03115 Cellulitis of right lower limb: Secondary | ICD-10-CM | POA: Diagnosis not present

## 2019-08-07 DIAGNOSIS — E1142 Type 2 diabetes mellitus with diabetic polyneuropathy: Secondary | ICD-10-CM | POA: Diagnosis not present

## 2019-08-07 DIAGNOSIS — L97518 Non-pressure chronic ulcer of other part of right foot with other specified severity: Secondary | ICD-10-CM | POA: Diagnosis not present

## 2019-08-07 DIAGNOSIS — Z4781 Encounter for orthopedic aftercare following surgical amputation: Secondary | ICD-10-CM | POA: Diagnosis not present

## 2019-08-07 DIAGNOSIS — E11621 Type 2 diabetes mellitus with foot ulcer: Secondary | ICD-10-CM | POA: Diagnosis not present

## 2019-08-07 DIAGNOSIS — L03115 Cellulitis of right lower limb: Secondary | ICD-10-CM | POA: Diagnosis not present

## 2019-08-08 ENCOUNTER — Other Ambulatory Visit: Payer: Self-pay

## 2019-08-08 ENCOUNTER — Ambulatory Visit (INDEPENDENT_AMBULATORY_CARE_PROVIDER_SITE_OTHER): Payer: Medicare Other | Admitting: Podiatry

## 2019-08-08 VITALS — Temp 97.6°F

## 2019-08-08 DIAGNOSIS — L97413 Non-pressure chronic ulcer of right heel and midfoot with necrosis of muscle: Secondary | ICD-10-CM | POA: Diagnosis not present

## 2019-08-08 DIAGNOSIS — E08621 Diabetes mellitus due to underlying condition with foot ulcer: Secondary | ICD-10-CM | POA: Diagnosis not present

## 2019-08-08 MED ORDER — HYDROMORPHONE HCL 4 MG PO TABS: 4.0000 mg | ORAL_TABLET | Freq: Every day | ORAL | 0 refills | Status: DC | PRN

## 2019-08-09 DIAGNOSIS — E1142 Type 2 diabetes mellitus with diabetic polyneuropathy: Secondary | ICD-10-CM | POA: Diagnosis not present

## 2019-08-09 DIAGNOSIS — L97518 Non-pressure chronic ulcer of other part of right foot with other specified severity: Secondary | ICD-10-CM | POA: Diagnosis not present

## 2019-08-09 DIAGNOSIS — L03115 Cellulitis of right lower limb: Secondary | ICD-10-CM | POA: Diagnosis not present

## 2019-08-09 DIAGNOSIS — Z4781 Encounter for orthopedic aftercare following surgical amputation: Secondary | ICD-10-CM | POA: Diagnosis not present

## 2019-08-09 DIAGNOSIS — E11621 Type 2 diabetes mellitus with foot ulcer: Secondary | ICD-10-CM | POA: Diagnosis not present

## 2019-08-10 NOTE — Progress Notes (Signed)
Subjective:  Patient ID: Katherine Herrera, female    DOB: 05/03/1966,  MRN: [id number]  Chief Complaint  Patient presents with  . Wound Check    Pt states right lateral wound got very painful 3 days ago, redness around wound, shooting pain up lower leg. Proximal tissue around wound is very tender to touch.   53 y.o. female returns for post-op check.  States there is looking worse having a lot of redness.  Feels very drained.  Review of Systems: Negative except as noted in the HPI. Denies N/V/F/Ch.  Past Medical History:  Diagnosis Date  . Anxiety   . COPD (chronic obstructive pulmonary disease) ([place])   . DM (diabetes mellitus) ([place])    type 2  . Family history of adverse reaction to anesthesia    mother hard to wake up  . Gout    possible  . Hyperlipidemia   . Hypothyroidism   . LBP (low back pain)    DR Patrice Paradise  . Migraine   . Peripheral neuropathy   . Syncope    poss conversion disorder vs psychogenic seizures  . Tachycardia    diet coke overuse?  Marland Kitchen Tenosynovitis of foot and ankle 09/03/2013  . TTS (tarsal tunnel syndrome) 2010   Left Foot [place] in [place]  . Vitamin D deficiency   . Wound, open    both feet outer parts no drainage, changes dressing 1 day, wounds dime size for last month    Current Outpatient Medications:  .  aspirin (BAYER ASPIRIN) 325 MG tablet, Take 1 tablet (325 mg total) by mouth daily., Disp: 100 tablet, Rfl: 3 .  B-D INS SYR ULTRAFINE 1CC/31G 31G X 5/16" 1 ML MISC, USE AS DIRECTED, Disp: 100 each, Rfl: 0 .  baclofen (LIORESAL) 10 MG tablet, Take 2 tablets (20 mg total) by mouth 2 (two) times daily., Disp: 180 tablet, Rfl: 1 .  Blood Glucose Monitoring Suppl (ONETOUCH VERIO) w/Device KIT, , Disp: , Rfl:  .  cholecalciferol (VITAMIN D) 25 MCG (1000 UT) tablet, Take 2 tablets (2,000 Units total) by mouth daily., Disp: 100 tablet, Rfl: 3 .  clonazePAM (KLONOPIN) 0.5 MG tablet, Take 1 tablet (0.5 mg total) by mouth 2 (two) times daily as needed for  anxiety., Disp: 30 tablet, Rfl: 1 .  ezetimibe (ZETIA) 10 MG tablet, Take 1 tablet (10 mg total) by mouth daily., Disp: 90 tablet, Rfl: 3 .  gabapentin (NEURONTIN) 800 MG tablet, Take 1 tablet (800 mg total) by mouth 4 (four) times daily., Disp: 360 tablet, Rfl: 1 .  glucose blood (ONETOUCH VERIO) test strip, Use to check blood sugar 2 times per day., Disp: 100 each, Rfl: 12 .  Insulin Syringe-Needle U-100 (SURE COMFORT INSULIN SYRINGE) 31G X 5/16" 0.5 ML MISC, USE AS DIRECTED WITH INSULIN 4 TIMES A DAY, Disp: 400 each, Rfl: 2 .  lamoTRIgine (LAMICTAL) 25 MG tablet, TAKE 1 TABLET BY MOUTH TWO  TIMES DAILY (Patient taking differently: Take 25 mg by mouth 2 (two) times daily. ), Disp: 180 tablet, Rfl: 1 .  Lancet Devices (ACCU-CHEK SOFTCLIX) lancets, Test two times daily, Disp: 100 each, Rfl: 12 .  levothyroxine (SYNTHROID, LEVOTHROID) 75 MCG tablet, TAKE 1 TABLET BY MOUTH  DAILY (Patient taking differently: Take 75 mcg by mouth daily before breakfast. ), Disp: 90 tablet, Rfl: 3 .  loratadine (CLARITIN) 10 MG tablet, Take 10 mg by mouth daily as needed for allergies., Disp: , Rfl:  .  polyethylene glycol (MIRALAX / GLYCOLAX) 17 g  packet, Take 17 g by mouth daily as needed for moderate constipation., Disp: 14 each, Rfl: 0 .  rOPINIRole (REQUIP) 0.5 MG tablet, Take 1 tablet (0.5 mg total) by mouth at bedtime., Disp: 90 tablet, Rfl: 1 .  senna-docusate (SENOKOT-S) 8.6-50 MG tablet, Take 1 tablet by mouth 2 (two) times daily. (Patient taking differently: Take 1 tablet by mouth at bedtime. ), Disp: 20 tablet, Rfl: 0 .  vitamin B-12 (CYANOCOBALAMIN) 1000 MCG tablet, Take 1,000 mcg by mouth daily.  , Disp: , Rfl:  .  HYDROmorphone (DILAUDID) 4 MG tablet, Take 1 tablet (4 mg total) by mouth 5 (five) times daily as needed for severe pain. Every 4 hours as needed for severe pain, not to exceed 5 times daily, Disp: 30 tablet, Rfl: 0 .  insulin NPH-regular Human (70-30) 100 UNIT/ML injection, Inject 10 Units into  the skin 2 (two) times daily with a meal., Disp: 10 mL, Rfl: 11 .  oxyCODONE (ROXICODONE) 15 MG immediate release tablet, TAKE 1 TABLET (15 MG TOTAL) BY MOUTH EVERY 6 (SIX) HOURS AS NEEDED. DNFB 9/30, Disp: , Rfl:   Social History   Tobacco Use  Smoking Status Current Every Day Smoker  . Packs/day: 1.00  Smokeless Tobacco Never Used    Allergies  Allergen Reactions  . Hydromorphone Hcl Itching    Patient has been tolerating Hydromorphone tablets without adverse effect (07/09/19)  . Cephalexin Nausea And Vomiting  . Demerol  [Meperidine Hcl] Rash  . Lovastatin Other (See Comments)    Other reaction(s): Unknown (See Comments) Possible myalgia Unknown   . Metformin And Related Nausea And Vomiting  . Sulfamethoxazole-Trimethoprim     Other reaction(s): Unknown DILI, pancreatitis  . Crestor [Rosuvastatin Calcium]     myalgia  . Niacin Other (See Comments)    Unknown   . Paroxetine Other (See Comments)    Other reaction(s): Unknown (See Comments) unknown Unknown    Objective:   Vitals:   07/19/19 1609  Temp: 99.6 F (37.6 C)   There is no height or weight on file to calculate BMI. Constitutional Well developed. Well nourished.  Vascular Foot warm and well perfused. Capillary refill normal to all digits.   Neurologic Normal speech. Oriented to person, place, and time. Epicritic sensation to light touch grossly present bilaterally.  Dermatologic  incision right foot with local warmth erythema  Right foot wound with fibrotic wound base erythema along the incision excessive warmth  Orthopedic: Tenderness to palpation noted about the left 5th met base.  Cavus foot type right   Radiographs: X-rays taken reviewed no acute fracture dislocations no signs of acute osteomyelitis Assessment:   1. Skin ulcer of right foot, limited to breakdown of skin [facility])    Plan:  Patient was evaluated and treated and all questions answered.  Status post right fifth metatarsal resection  with peroneal tenodesis -Given redness along the incision recalcitrant to antibiotics will directly made patient to the hospital.  Would benefit from debridement with possible wound VAC application  No follow-ups on file.

## 2019-08-12 DIAGNOSIS — E1165 Type 2 diabetes mellitus with hyperglycemia: Secondary | ICD-10-CM | POA: Diagnosis not present

## 2019-08-12 DIAGNOSIS — E7800 Pure hypercholesterolemia, unspecified: Secondary | ICD-10-CM | POA: Diagnosis not present

## 2019-08-12 DIAGNOSIS — E11621 Type 2 diabetes mellitus with foot ulcer: Secondary | ICD-10-CM | POA: Diagnosis not present

## 2019-08-12 DIAGNOSIS — I10 Essential (primary) hypertension: Secondary | ICD-10-CM | POA: Diagnosis not present

## 2019-08-12 DIAGNOSIS — E1140 Type 2 diabetes mellitus with diabetic neuropathy, unspecified: Secondary | ICD-10-CM | POA: Diagnosis not present

## 2019-08-12 DIAGNOSIS — L03115 Cellulitis of right lower limb: Secondary | ICD-10-CM | POA: Diagnosis not present

## 2019-08-12 DIAGNOSIS — N390 Urinary tract infection, site not specified: Secondary | ICD-10-CM | POA: Diagnosis not present

## 2019-08-12 DIAGNOSIS — Z4781 Encounter for orthopedic aftercare following surgical amputation: Secondary | ICD-10-CM | POA: Diagnosis not present

## 2019-08-12 DIAGNOSIS — E039 Hypothyroidism, unspecified: Secondary | ICD-10-CM | POA: Diagnosis not present

## 2019-08-12 DIAGNOSIS — L97518 Non-pressure chronic ulcer of other part of right foot with other specified severity: Secondary | ICD-10-CM | POA: Diagnosis not present

## 2019-08-12 DIAGNOSIS — E1142 Type 2 diabetes mellitus with diabetic polyneuropathy: Secondary | ICD-10-CM | POA: Diagnosis not present

## 2019-08-14 DIAGNOSIS — E11621 Type 2 diabetes mellitus with foot ulcer: Secondary | ICD-10-CM | POA: Diagnosis not present

## 2019-08-14 DIAGNOSIS — L03115 Cellulitis of right lower limb: Secondary | ICD-10-CM | POA: Diagnosis not present

## 2019-08-14 DIAGNOSIS — L97518 Non-pressure chronic ulcer of other part of right foot with other specified severity: Secondary | ICD-10-CM | POA: Diagnosis not present

## 2019-08-14 DIAGNOSIS — E1142 Type 2 diabetes mellitus with diabetic polyneuropathy: Secondary | ICD-10-CM | POA: Diagnosis not present

## 2019-08-14 DIAGNOSIS — Z4781 Encounter for orthopedic aftercare following surgical amputation: Secondary | ICD-10-CM | POA: Diagnosis not present

## 2019-08-15 ENCOUNTER — Other Ambulatory Visit: Payer: Self-pay | Admitting: Internal Medicine

## 2019-08-15 DIAGNOSIS — E11621 Type 2 diabetes mellitus with foot ulcer: Secondary | ICD-10-CM | POA: Diagnosis not present

## 2019-08-16 ENCOUNTER — Encounter: Payer: Self-pay | Admitting: Podiatry

## 2019-08-16 ENCOUNTER — Other Ambulatory Visit: Payer: Self-pay

## 2019-08-16 ENCOUNTER — Ambulatory Visit (INDEPENDENT_AMBULATORY_CARE_PROVIDER_SITE_OTHER): Payer: Medicare Other | Admitting: Podiatry

## 2019-08-16 VITALS — Temp 97.5°F

## 2019-08-16 DIAGNOSIS — L97413 Non-pressure chronic ulcer of right heel and midfoot with necrosis of muscle: Secondary | ICD-10-CM

## 2019-08-16 DIAGNOSIS — E08621 Diabetes mellitus due to underlying condition with foot ulcer: Secondary | ICD-10-CM

## 2019-08-19 DIAGNOSIS — Z4781 Encounter for orthopedic aftercare following surgical amputation: Secondary | ICD-10-CM | POA: Diagnosis not present

## 2019-08-19 DIAGNOSIS — L97518 Non-pressure chronic ulcer of other part of right foot with other specified severity: Secondary | ICD-10-CM | POA: Diagnosis not present

## 2019-08-19 DIAGNOSIS — E11621 Type 2 diabetes mellitus with foot ulcer: Secondary | ICD-10-CM | POA: Diagnosis not present

## 2019-08-19 DIAGNOSIS — E1142 Type 2 diabetes mellitus with diabetic polyneuropathy: Secondary | ICD-10-CM | POA: Diagnosis not present

## 2019-08-19 DIAGNOSIS — L03115 Cellulitis of right lower limb: Secondary | ICD-10-CM | POA: Diagnosis not present

## 2019-08-20 DIAGNOSIS — Z4781 Encounter for orthopedic aftercare following surgical amputation: Secondary | ICD-10-CM | POA: Diagnosis not present

## 2019-08-20 DIAGNOSIS — L97518 Non-pressure chronic ulcer of other part of right foot with other specified severity: Secondary | ICD-10-CM | POA: Diagnosis not present

## 2019-08-20 DIAGNOSIS — E1142 Type 2 diabetes mellitus with diabetic polyneuropathy: Secondary | ICD-10-CM | POA: Diagnosis not present

## 2019-08-20 DIAGNOSIS — L03115 Cellulitis of right lower limb: Secondary | ICD-10-CM | POA: Diagnosis not present

## 2019-08-20 DIAGNOSIS — E11621 Type 2 diabetes mellitus with foot ulcer: Secondary | ICD-10-CM | POA: Diagnosis not present

## 2019-08-21 DIAGNOSIS — L03115 Cellulitis of right lower limb: Secondary | ICD-10-CM | POA: Diagnosis not present

## 2019-08-21 DIAGNOSIS — Z4781 Encounter for orthopedic aftercare following surgical amputation: Secondary | ICD-10-CM | POA: Diagnosis not present

## 2019-08-21 DIAGNOSIS — E1142 Type 2 diabetes mellitus with diabetic polyneuropathy: Secondary | ICD-10-CM | POA: Diagnosis not present

## 2019-08-21 DIAGNOSIS — L97518 Non-pressure chronic ulcer of other part of right foot with other specified severity: Secondary | ICD-10-CM | POA: Diagnosis not present

## 2019-08-21 DIAGNOSIS — E11621 Type 2 diabetes mellitus with foot ulcer: Secondary | ICD-10-CM | POA: Diagnosis not present

## 2019-08-22 DIAGNOSIS — E11621 Type 2 diabetes mellitus with foot ulcer: Secondary | ICD-10-CM | POA: Diagnosis not present

## 2019-08-22 LAB — FUNGUS CULTURE WITH STAIN

## 2019-08-22 LAB — FUNGAL ORGANISM REFLEX

## 2019-08-22 LAB — FUNGUS CULTURE RESULT

## 2019-08-24 DIAGNOSIS — E11621 Type 2 diabetes mellitus with foot ulcer: Secondary | ICD-10-CM | POA: Diagnosis not present

## 2019-08-25 ENCOUNTER — Other Ambulatory Visit: Payer: Self-pay | Admitting: Internal Medicine

## 2019-08-26 DIAGNOSIS — L97518 Non-pressure chronic ulcer of other part of right foot with other specified severity: Secondary | ICD-10-CM | POA: Diagnosis not present

## 2019-08-26 DIAGNOSIS — Z4781 Encounter for orthopedic aftercare following surgical amputation: Secondary | ICD-10-CM | POA: Diagnosis not present

## 2019-08-26 DIAGNOSIS — E11621 Type 2 diabetes mellitus with foot ulcer: Secondary | ICD-10-CM | POA: Diagnosis not present

## 2019-08-26 DIAGNOSIS — E1142 Type 2 diabetes mellitus with diabetic polyneuropathy: Secondary | ICD-10-CM | POA: Diagnosis not present

## 2019-08-26 DIAGNOSIS — L03115 Cellulitis of right lower limb: Secondary | ICD-10-CM | POA: Diagnosis not present

## 2019-08-27 DIAGNOSIS — Z4781 Encounter for orthopedic aftercare following surgical amputation: Secondary | ICD-10-CM | POA: Diagnosis not present

## 2019-08-27 DIAGNOSIS — L97518 Non-pressure chronic ulcer of other part of right foot with other specified severity: Secondary | ICD-10-CM | POA: Diagnosis not present

## 2019-08-27 DIAGNOSIS — E1142 Type 2 diabetes mellitus with diabetic polyneuropathy: Secondary | ICD-10-CM | POA: Diagnosis not present

## 2019-08-27 DIAGNOSIS — E11621 Type 2 diabetes mellitus with foot ulcer: Secondary | ICD-10-CM | POA: Diagnosis not present

## 2019-08-27 DIAGNOSIS — L03115 Cellulitis of right lower limb: Secondary | ICD-10-CM | POA: Diagnosis not present

## 2019-08-28 DIAGNOSIS — L03115 Cellulitis of right lower limb: Secondary | ICD-10-CM | POA: Diagnosis not present

## 2019-08-28 DIAGNOSIS — E1142 Type 2 diabetes mellitus with diabetic polyneuropathy: Secondary | ICD-10-CM | POA: Diagnosis not present

## 2019-08-28 DIAGNOSIS — Z4781 Encounter for orthopedic aftercare following surgical amputation: Secondary | ICD-10-CM | POA: Diagnosis not present

## 2019-08-28 DIAGNOSIS — L97518 Non-pressure chronic ulcer of other part of right foot with other specified severity: Secondary | ICD-10-CM | POA: Diagnosis not present

## 2019-08-28 DIAGNOSIS — E11621 Type 2 diabetes mellitus with foot ulcer: Secondary | ICD-10-CM | POA: Diagnosis not present

## 2019-08-30 ENCOUNTER — Ambulatory Visit (INDEPENDENT_AMBULATORY_CARE_PROVIDER_SITE_OTHER): Payer: Medicare Other | Admitting: Podiatry

## 2019-08-30 ENCOUNTER — Other Ambulatory Visit: Payer: Self-pay

## 2019-08-30 DIAGNOSIS — Z4781 Encounter for orthopedic aftercare following surgical amputation: Secondary | ICD-10-CM | POA: Diagnosis not present

## 2019-08-30 DIAGNOSIS — E11621 Type 2 diabetes mellitus with foot ulcer: Secondary | ICD-10-CM | POA: Diagnosis not present

## 2019-08-30 DIAGNOSIS — L03115 Cellulitis of right lower limb: Secondary | ICD-10-CM | POA: Diagnosis not present

## 2019-08-30 DIAGNOSIS — L97413 Non-pressure chronic ulcer of right heel and midfoot with necrosis of muscle: Secondary | ICD-10-CM

## 2019-08-30 DIAGNOSIS — L97518 Non-pressure chronic ulcer of other part of right foot with other specified severity: Secondary | ICD-10-CM | POA: Diagnosis not present

## 2019-08-30 DIAGNOSIS — E08621 Diabetes mellitus due to underlying condition with foot ulcer: Secondary | ICD-10-CM

## 2019-08-30 DIAGNOSIS — E1142 Type 2 diabetes mellitus with diabetic polyneuropathy: Secondary | ICD-10-CM | POA: Diagnosis not present

## 2019-08-30 DIAGNOSIS — Z9889 Other specified postprocedural states: Secondary | ICD-10-CM

## 2019-08-30 NOTE — Progress Notes (Signed)
Subjective:  Patient ID: Katherine Herrera, female    DOB: 05/19/1966,  MRN: [id number]  No chief complaint on file.   53 y.o. female presents for wound care.  States the wound is doing better having home health care in addition to PT.  Review of Systems: Negative except as noted in the HPI. Denies N/V/F/Ch.  Past Medical History:  Diagnosis Date  . Anxiety   . COPD (chronic obstructive pulmonary disease) ([place])   . DM (diabetes mellitus) ([place])    type 2  . Family history of adverse reaction to anesthesia    mother hard to wake up  . Gout    possible  . Hyperlipidemia   . Hypothyroidism   . LBP (low back pain)    DR Patrice Paradise  . Migraine   . Peripheral neuropathy   . Syncope    poss conversion disorder vs psychogenic seizures  . Tachycardia    diet coke overuse?  Marland Kitchen Tenosynovitis of foot and ankle 09/03/2013  . TTS (tarsal tunnel syndrome) 2010   Left Foot Vogler in [place]  . Vitamin D deficiency   . Wound, open    both feet outer parts no drainage, changes dressing 1 day, wounds dime size for last month    Current Outpatient Medications:  .  aspirin (BAYER ASPIRIN) 325 MG tablet, Take 1 tablet (325 mg total) by mouth daily., Disp: 100 tablet, Rfl: 3 .  B-D INS SYR ULTRAFINE 1CC/31G 31G X 5/16" 1 ML MISC, USE AS DIRECTED, Disp: 100 each, Rfl: 0 .  baclofen (LIORESAL) 10 MG tablet, TAKE 2 TABLETS BY MOUTH  TWICE DAILY, Disp: 180 tablet, Rfl: 1 .  Blood Glucose Monitoring Suppl (ONETOUCH VERIO) w/Device KIT, , Disp: , Rfl:  .  cholecalciferol (VITAMIN D) 25 MCG (1000 UT) tablet, Take 2 tablets (2,000 Units total) by mouth daily., Disp: 100 tablet, Rfl: 3 .  clonazePAM (KLONOPIN) 0.5 MG tablet, Take 1 tablet (0.5 mg total) by mouth 2 (two) times daily as needed for anxiety., Disp: 30 tablet, Rfl: 1 .  ezetimibe (ZETIA) 10 MG tablet, Take 1 tablet (10 mg total) by mouth daily., Disp: 90 tablet, Rfl: 3 .  gabapentin (NEURONTIN) 800 MG tablet, TAKE 1 TABLET BY MOUTH 4  TIMES DAILY, Disp:  360 tablet, Rfl: 3 .  glucose blood (ONETOUCH VERIO) test strip, Use to check blood sugar 2 times per day., Disp: 100 each, Rfl: 12 .  HYDROmorphone (DILAUDID) 4 MG tablet, Take 1 tablet (4 mg total) by mouth 5 (five) times daily as needed for severe pain. Every 4 hours as needed for severe pain, not to exceed 5 times daily, Disp: 30 tablet, Rfl: 0 .  insulin NPH-regular Human (70-30) 100 UNIT/ML injection, Inject 10 Units into the skin 2 (two) times daily with a meal., Disp: 10 mL, Rfl: 11 .  Insulin Syringe-Needle U-100 (SURE COMFORT INSULIN SYRINGE) 31G X 5/16" 0.5 ML MISC, USE AS DIRECTED WITH INSULIN 4 TIMES A DAY, Disp: 400 each, Rfl: 2 .  lamoTRIgine (LAMICTAL) 25 MG tablet, TAKE 1 TABLET BY MOUTH TWO  TIMES DAILY, Disp: 180 tablet, Rfl: 3 .  Lancet Devices (ACCU-CHEK SOFTCLIX) lancets, Test two times daily, Disp: 100 each, Rfl: 12 .  levothyroxine (SYNTHROID) 75 MCG tablet, TAKE 1 TABLET BY MOUTH  DAILY, Disp: 90 tablet, Rfl: 3 .  loratadine (CLARITIN) 10 MG tablet, Take 10 mg by mouth daily as needed for allergies., Disp: , Rfl:  .  oxyCODONE (ROXICODONE) 15 MG immediate release  tablet, TAKE 1 TABLET (15 MG TOTAL) BY MOUTH EVERY 6 (SIX) HOURS AS NEEDED. DNFB 9/30, Disp: , Rfl:  .  polyethylene glycol (MIRALAX / GLYCOLAX) 17 g packet, Take 17 g by mouth daily as needed for moderate constipation., Disp: 14 each, Rfl: 0 .  rOPINIRole (REQUIP) 0.5 MG tablet, TAKE 1 TABLET BY MOUTH AT  BEDTIME, Disp: 90 tablet, Rfl: 3 .  senna-docusate (SENOKOT-S) 8.6-50 MG tablet, Take 1 tablet by mouth 2 (two) times daily. (Patient taking differently: Take 1 tablet by mouth at bedtime. ), Disp: 20 tablet, Rfl: 0 .  vitamin B-12 (CYANOCOBALAMIN) 1000 MCG tablet, Take 1,000 mcg by mouth daily.  , Disp: , Rfl:   Social History   Tobacco Use  Smoking Status Current Every Day Smoker  . Packs/day: 1.00  Smokeless Tobacco Never Used    Allergies  Allergen Reactions  . Hydromorphone Hcl Itching    Patient has  been tolerating Hydromorphone tablets without adverse effect (07/09/19)  . Cephalexin Nausea And Vomiting  . Demerol  [Meperidine Hcl] Rash  . Lovastatin Other (See Comments)    Other reaction(s): Unknown (See Comments) Possible myalgia Unknown   . Metformin And Related Nausea And Vomiting  . Sulfamethoxazole-Trimethoprim     Other reaction(s): Unknown DILI, pancreatitis  . Crestor [Rosuvastatin Calcium]     myalgia  . Niacin Other (See Comments)    Unknown   . Paroxetine Other (See Comments)    Other reaction(s): Unknown (See Comments) unknown Unknown    Objective:  There were no vitals filed for this visit. There is no height or weight on file to calculate BMI. Constitutional Well developed. Well nourished.  Vascular Dorsalis pedis pulses palpable bilaterally. Posterior tibial pulses palpable bilaterally. Capillary refill normal to all digits.  No cyanosis or clubbing noted. Pedal hair growth normal.  Neurologic Normal speech. Oriented to person, place, and time. Protective sensation absent  Dermatologic Wound Location: Right 5th met lateral Wound Base: Granular/Healthy Peri-wound: Macerated Exudate: Scant/small amount Serosanguinous exudate Wound Measurements: - 1 x 2  Orthopedic: No pain to palpation either foot.   Radiographs: None Assessment:   No diagnosis found. Plan:  Patient was evaluated and treated and all questions answered.  Ulcer right foot -Wound improving. -Gently debrided.  Covered under global. -Packed with Prisma.  Home health care to continue to apply. -Follow-up in 2 weeks for recheck   No follow-ups on file.

## 2019-09-02 ENCOUNTER — Ambulatory Visit (INDEPENDENT_AMBULATORY_CARE_PROVIDER_SITE_OTHER): Payer: Medicare Other | Admitting: Internal Medicine

## 2019-09-02 ENCOUNTER — Other Ambulatory Visit: Payer: Self-pay

## 2019-09-02 ENCOUNTER — Encounter: Payer: Self-pay | Admitting: Internal Medicine

## 2019-09-02 DIAGNOSIS — M5440 Lumbago with sciatica, unspecified side: Secondary | ICD-10-CM | POA: Diagnosis not present

## 2019-09-02 DIAGNOSIS — L959 Vasculitis limited to the skin, unspecified: Secondary | ICD-10-CM | POA: Diagnosis not present

## 2019-09-02 DIAGNOSIS — B379 Candidiasis, unspecified: Secondary | ICD-10-CM | POA: Diagnosis not present

## 2019-09-02 DIAGNOSIS — F411 Generalized anxiety disorder: Secondary | ICD-10-CM | POA: Diagnosis not present

## 2019-09-02 DIAGNOSIS — E1140 Type 2 diabetes mellitus with diabetic neuropathy, unspecified: Secondary | ICD-10-CM

## 2019-09-02 DIAGNOSIS — J449 Chronic obstructive pulmonary disease, unspecified: Secondary | ICD-10-CM | POA: Diagnosis not present

## 2019-09-02 DIAGNOSIS — E1142 Type 2 diabetes mellitus with diabetic polyneuropathy: Secondary | ICD-10-CM | POA: Diagnosis not present

## 2019-09-02 DIAGNOSIS — E11621 Type 2 diabetes mellitus with foot ulcer: Secondary | ICD-10-CM | POA: Diagnosis not present

## 2019-09-02 DIAGNOSIS — E538 Deficiency of other specified B group vitamins: Secondary | ICD-10-CM

## 2019-09-02 DIAGNOSIS — L97511 Non-pressure chronic ulcer of other part of right foot limited to breakdown of skin: Secondary | ICD-10-CM | POA: Diagnosis not present

## 2019-09-02 MED ORDER — OXYCODONE HCL 15 MG PO TABS: 15.0000 mg | ORAL_TABLET | Freq: Four times a day (QID) | ORAL | 0 refills | Status: DC | PRN

## 2019-09-02 MED ORDER — OXYCODONE HCL 15 MG PO TABS: ORAL_TABLET | ORAL | 0 refills | Status: DC

## 2019-09-02 NOTE — Progress Notes (Signed)
Subjective:  Patient ID: Katherine Herrera, female    DOB: 1966-09-03  Age: 53 y.o. MRN: [id number]  CC: No chief complaint on file.   HPI SAIDY ORMAND presents for chronic pain, anxiety, diabetic foot (R) f/u S/p hosp stay and 3 more surgeries on the R foot - Dr March Rummage  Outpatient Medications Prior to Visit  Medication Sig Dispense Refill   aspirin (BAYER ASPIRIN) 325 MG tablet Take 1 tablet (325 mg total) by mouth daily. 100 tablet 3   B-D INS SYR ULTRAFINE 1CC/31G 31G X 5/16" 1 ML MISC USE AS DIRECTED 100 each 0   baclofen (LIORESAL) 10 MG tablet TAKE 2 TABLETS BY MOUTH  TWICE DAILY 180 tablet 1   Blood Glucose Monitoring Suppl (ONETOUCH VERIO) w/Device KIT      cholecalciferol (VITAMIN D) 25 MCG (1000 UT) tablet Take 2 tablets (2,000 Units total) by mouth daily. 100 tablet 3   clonazePAM (KLONOPIN) 0.5 MG tablet Take 1 tablet (0.5 mg total) by mouth 2 (two) times daily as needed for anxiety. 30 tablet 1   ezetimibe (ZETIA) 10 MG tablet Take 1 tablet (10 mg total) by mouth daily. 90 tablet 3   gabapentin (NEURONTIN) 800 MG tablet TAKE 1 TABLET BY MOUTH 4  TIMES DAILY 360 tablet 3   glucose blood (ONETOUCH VERIO) test strip Use to check blood sugar 2 times per day. 100 each 12   HYDROmorphone (DILAUDID) 4 MG tablet Take 1 tablet (4 mg total) by mouth 5 (five) times daily as needed for severe pain. Every 4 hours as needed for severe pain, not to exceed 5 times daily 30 tablet 0   insulin NPH-regular Human (70-30) 100 UNIT/ML injection Inject 10 Units into the skin 2 (two) times daily with a meal. 10 mL 11   Insulin Syringe-Needle U-100 (SURE COMFORT INSULIN SYRINGE) 31G X 5/16" 0.5 ML MISC USE AS DIRECTED WITH INSULIN 4 TIMES A DAY 400 each 2   lamoTRIgine (LAMICTAL) 25 MG tablet TAKE 1 TABLET BY MOUTH TWO  TIMES DAILY 180 tablet 3   Lancet Devices (ACCU-CHEK SOFTCLIX) lancets Test two times daily 100 each 12   levothyroxine (SYNTHROID) 75 MCG tablet TAKE 1 TABLET BY MOUTH   DAILY 90 tablet 3   loratadine (CLARITIN) 10 MG tablet Take 10 mg by mouth daily as needed for allergies.     oxyCODONE (ROXICODONE) 15 MG immediate release tablet TAKE 1 TABLET (15 MG TOTAL) BY MOUTH EVERY 6 (SIX) HOURS AS NEEDED. DNFB 9/30     polyethylene glycol (MIRALAX / GLYCOLAX) 17 g packet Take 17 g by mouth daily as needed for moderate constipation. 14 each 0   rOPINIRole (REQUIP) 0.5 MG tablet TAKE 1 TABLET BY MOUTH AT  BEDTIME 90 tablet 3   senna-docusate (SENOKOT-S) 8.6-50 MG tablet Take 1 tablet by mouth 2 (two) times daily. (Patient taking differently: Take 1 tablet by mouth at bedtime. ) 20 tablet 0   vitamin B-12 (CYANOCOBALAMIN) 1000 MCG tablet Take 1,000 mcg by mouth daily.       No facility-administered medications prior to visit.     ROS: Review of Systems  Constitutional: Negative for activity change, appetite change, chills, fatigue and unexpected weight change.  HENT: Negative for congestion, mouth sores and sinus pressure.   Eyes: Negative for visual disturbance.  Respiratory: Negative for cough and chest tightness.   Gastrointestinal: Negative for abdominal pain and nausea.  Genitourinary: Negative for difficulty urinating, frequency and vaginal pain.  Musculoskeletal: Positive  for back pain and gait problem.  Skin: Positive for wound. Negative for pallor and rash.  Neurological: Negative for dizziness, tremors, weakness, numbness and headaches.  Psychiatric/Behavioral: Negative for confusion and sleep disturbance.    Objective:  BP 120/74 (BP Location: Left Arm, Patient Position: Sitting, Cuff Size: Large)    Pulse 78    Temp (!) 97.5 F (36.4 C) (Oral)    Ht '5\' 9"'  (1.753 m)    Wt 222 lb (100.7 kg)    SpO2 95%    BMI 32.78 kg/m   BP Readings from Last 3 Encounters:  09/02/19 120/74  07/25/19 (!) 184/98  07/10/19 113/66    Wt Readings from Last 3 Encounters:  09/02/19 222 lb (100.7 kg)  07/22/19 255 lb 8.2 oz (115.9 kg)  07/05/19 228 lb 13.4 oz  (103.8 kg)    Physical Exam Constitutional:      General: She is not in acute distress.    Appearance: She is well-developed. She is obese.  HENT:     Head: Normocephalic.     Right Ear: External ear normal.     Left Ear: External ear normal.     Nose: Nose normal.  Eyes:     General:        Right eye: No discharge.        Left eye: No discharge.     Conjunctiva/sclera: Conjunctivae normal.     Pupils: Pupils are equal, round, and reactive to light.  Neck:     Musculoskeletal: Normal range of motion and neck supple.     Thyroid: No thyromegaly.     Vascular: No JVD.     Trachea: No tracheal deviation.  Cardiovascular:     Rate and Rhythm: Normal rate and regular rhythm.     Heart sounds: Normal heart sounds.  Pulmonary:     Effort: No respiratory distress.     Breath sounds: No stridor. No wheezing.  Abdominal:     General: Bowel sounds are normal. There is no distension.     Palpations: Abdomen is soft. There is no mass.     Tenderness: There is no abdominal tenderness. There is no guarding or rebound.  Musculoskeletal:        General: Tenderness present.  Lymphadenopathy:     Cervical: No cervical adenopathy.  Skin:    Coloration: Skin is not pale.     Findings: No erythema or rash.  Neurological:     Mental Status: She is oriented to person, place, and time.     Cranial Nerves: No cranial nerve deficit.     Motor: No weakness or abnormal muscle tone.     Coordination: Coordination abnormal.     Gait: Gait abnormal.     Deep Tendon Reflexes: Reflexes normal.  Psychiatric:        Behavior: Behavior normal.        Thought Content: Thought content normal.        Judgment: Judgment normal.    R foot in a surg shoe; dressed  LS tender w/ROM   Lab Results  Component Value Date   WBC 9.8 07/25/2019   HGB 11.3 (L) 07/25/2019   HCT 36.6 07/25/2019   PLT 312 07/25/2019   GLUCOSE 75 07/25/2019   CHOL 223 (H) 07/19/2016   TRIG (H) 07/19/2016    527.0  Triglyceride is over 400; calculations on Lipids are invalid.   HDL 27.40 (L) 07/19/2016   LDLDIRECT 106.0 07/19/2016   LDLCALC 81 01/02/2013  ALT 10 07/20/2019   AST 11 (L) 07/20/2019   NA 141 07/25/2019   K 5.5 (H) 07/25/2019   CL 104 07/25/2019   CREATININE 1.30 (H) 07/25/2019   BUN 25 (H) 07/25/2019   CO2 31 07/25/2019   TSH 1.575 07/20/2019   INR 1.0 07/19/2019   HGBA1C 7.8 (H) 07/19/2019   MICROALBUR 0.9 07/19/2016    Vas [place] With/wo Tbi  Result Date: 07/21/2019 LOWER EXTREMITY DOPPLER STUDY Indications: Ulceration. High Risk Factors: Diabetes. Other Factors: Post op foot infection worsening even with antibiotics. Admitted                for IV antibiotics and I&D of foot.  Vascular Interventions: 07/05/19 right 5th metatarsal resection. Comparison Study: 09/24/12 WNL Performing Technologist: June Leap Rdms, Rvt  Examination Guidelines: A complete evaluation includes at minimum, Doppler waveform signals and systolic blood pressure reading at the level of bilateral brachial, anterior tibial, and posterior tibial arteries, when vessel segments are accessible. Bilateral testing is considered an integral part of a complete examination. Photoelectric Plethysmograph (PPG) waveforms and toe systolic pressure readings are included as required and additional duplex testing as needed. Limited examinations for reoccurring indications may be performed as noted.  ABI Findings: +---------+------------------+-----+---------+-------------------------+  Right     Rt Pressure (mmHg) Index Waveform  Comment                    +---------+------------------+-----+---------+-------------------------+  Brachial                           triphasic BP not obtained due to IV  +---------+------------------+-----+---------+-------------------------+  ATA       148                1.33  biphasic                             +---------+------------------+-----+---------+-------------------------+  PTA       142                 1.28  triphasic                            +---------+------------------+-----+---------+-------------------------+  Great Toe 142                1.28  Abnormal                             +---------+------------------+-----+---------+-------------------------+ +---------+------------------+-----+---------+-------+  Left      Lt Pressure (mmHg) Index Waveform  Comment  +---------+------------------+-----+---------+-------+  Brachial  111                      triphasic          +---------+------------------+-----+---------+-------+  ATA       146                1.32  triphasic          +---------+------------------+-----+---------+-------+  PTA       155                1.40  biphasic           +---------+------------------+-----+---------+-------+  Great Toe 141                1.27  Abnormal           +---------+------------------+-----+---------+-------+  Summary: Right: Resting right ankle-brachial index indicates noncompressible right lower extremity arteries. The right toe-brachial index is normal. Left: Resting left ankle-brachial index indicates noncompressible left lower extremity arteries. The left toe-brachial index is normal.  *See table(s) above for measurements and observations.  Electronically signed by Ruta Hinds MD on 07/21/2019 at 11:00:34 AM.    Final     Assessment & Plan:   There are no diagnoses linked to this encounter.   No orders of the defined types were placed in this encounter.    Follow-up: No follow-ups on file.  Walker Kehr, MD

## 2019-09-02 NOTE — Assessment & Plan Note (Signed)
B12 po

## 2019-09-02 NOTE — Assessment & Plan Note (Signed)
Clonazepam prn  Potential benefits of a long term benzodiazepines  use as well as potential risks  and complications were explained to the patient and were aknowledged.  

## 2019-09-02 NOTE — Assessment & Plan Note (Signed)
Oxycodone   Potential benefits of a long term opioids use as well as potential risks (i.e. addiction risk, apnea etc) and complications (i.e. Somnolence, constipation and others) were explained to the patient and were aknowledged. 

## 2019-09-04 DIAGNOSIS — E1142 Type 2 diabetes mellitus with diabetic polyneuropathy: Secondary | ICD-10-CM | POA: Diagnosis not present

## 2019-09-04 DIAGNOSIS — B379 Candidiasis, unspecified: Secondary | ICD-10-CM | POA: Diagnosis not present

## 2019-09-04 DIAGNOSIS — L97511 Non-pressure chronic ulcer of other part of right foot limited to breakdown of skin: Secondary | ICD-10-CM | POA: Diagnosis not present

## 2019-09-04 DIAGNOSIS — J449 Chronic obstructive pulmonary disease, unspecified: Secondary | ICD-10-CM | POA: Diagnosis not present

## 2019-09-04 DIAGNOSIS — E11621 Type 2 diabetes mellitus with foot ulcer: Secondary | ICD-10-CM | POA: Diagnosis not present

## 2019-09-06 DIAGNOSIS — B379 Candidiasis, unspecified: Secondary | ICD-10-CM | POA: Diagnosis not present

## 2019-09-06 DIAGNOSIS — E1142 Type 2 diabetes mellitus with diabetic polyneuropathy: Secondary | ICD-10-CM | POA: Diagnosis not present

## 2019-09-06 DIAGNOSIS — J449 Chronic obstructive pulmonary disease, unspecified: Secondary | ICD-10-CM | POA: Diagnosis not present

## 2019-09-06 DIAGNOSIS — E11621 Type 2 diabetes mellitus with foot ulcer: Secondary | ICD-10-CM | POA: Diagnosis not present

## 2019-09-06 DIAGNOSIS — L97511 Non-pressure chronic ulcer of other part of right foot limited to breakdown of skin: Secondary | ICD-10-CM | POA: Diagnosis not present

## 2019-09-08 NOTE — Assessment & Plan Note (Signed)
Follow-up with Dr. Michiel Sites.  Continue with 70/30 insulin

## 2019-09-08 NOTE — Assessment & Plan Note (Signed)
It seems to be under control

## 2019-09-09 DIAGNOSIS — B379 Candidiasis, unspecified: Secondary | ICD-10-CM | POA: Diagnosis not present

## 2019-09-09 DIAGNOSIS — L97511 Non-pressure chronic ulcer of other part of right foot limited to breakdown of skin: Secondary | ICD-10-CM | POA: Diagnosis not present

## 2019-09-09 DIAGNOSIS — E1142 Type 2 diabetes mellitus with diabetic polyneuropathy: Secondary | ICD-10-CM | POA: Diagnosis not present

## 2019-09-09 DIAGNOSIS — E11621 Type 2 diabetes mellitus with foot ulcer: Secondary | ICD-10-CM | POA: Diagnosis not present

## 2019-09-09 DIAGNOSIS — J449 Chronic obstructive pulmonary disease, unspecified: Secondary | ICD-10-CM | POA: Diagnosis not present

## 2019-09-11 DIAGNOSIS — B379 Candidiasis, unspecified: Secondary | ICD-10-CM | POA: Diagnosis not present

## 2019-09-11 DIAGNOSIS — J449 Chronic obstructive pulmonary disease, unspecified: Secondary | ICD-10-CM | POA: Diagnosis not present

## 2019-09-11 DIAGNOSIS — E11621 Type 2 diabetes mellitus with foot ulcer: Secondary | ICD-10-CM | POA: Diagnosis not present

## 2019-09-11 DIAGNOSIS — L97511 Non-pressure chronic ulcer of other part of right foot limited to breakdown of skin: Secondary | ICD-10-CM | POA: Diagnosis not present

## 2019-09-11 DIAGNOSIS — E1142 Type 2 diabetes mellitus with diabetic polyneuropathy: Secondary | ICD-10-CM | POA: Diagnosis not present

## 2019-09-13 ENCOUNTER — Ambulatory Visit: Payer: Medicare Other | Admitting: Podiatry

## 2019-09-16 DIAGNOSIS — E11621 Type 2 diabetes mellitus with foot ulcer: Secondary | ICD-10-CM | POA: Diagnosis not present

## 2019-09-16 DIAGNOSIS — J449 Chronic obstructive pulmonary disease, unspecified: Secondary | ICD-10-CM | POA: Diagnosis not present

## 2019-09-16 DIAGNOSIS — E1142 Type 2 diabetes mellitus with diabetic polyneuropathy: Secondary | ICD-10-CM | POA: Diagnosis not present

## 2019-09-16 DIAGNOSIS — L97511 Non-pressure chronic ulcer of other part of right foot limited to breakdown of skin: Secondary | ICD-10-CM | POA: Diagnosis not present

## 2019-09-16 DIAGNOSIS — B379 Candidiasis, unspecified: Secondary | ICD-10-CM | POA: Diagnosis not present

## 2019-09-18 DIAGNOSIS — B379 Candidiasis, unspecified: Secondary | ICD-10-CM | POA: Diagnosis not present

## 2019-09-18 DIAGNOSIS — J449 Chronic obstructive pulmonary disease, unspecified: Secondary | ICD-10-CM | POA: Diagnosis not present

## 2019-09-18 DIAGNOSIS — E1142 Type 2 diabetes mellitus with diabetic polyneuropathy: Secondary | ICD-10-CM | POA: Diagnosis not present

## 2019-09-18 DIAGNOSIS — L97511 Non-pressure chronic ulcer of other part of right foot limited to breakdown of skin: Secondary | ICD-10-CM | POA: Diagnosis not present

## 2019-09-18 DIAGNOSIS — E11621 Type 2 diabetes mellitus with foot ulcer: Secondary | ICD-10-CM | POA: Diagnosis not present

## 2019-09-23 DIAGNOSIS — L97511 Non-pressure chronic ulcer of other part of right foot limited to breakdown of skin: Secondary | ICD-10-CM | POA: Diagnosis not present

## 2019-09-23 DIAGNOSIS — B379 Candidiasis, unspecified: Secondary | ICD-10-CM | POA: Diagnosis not present

## 2019-09-23 DIAGNOSIS — J449 Chronic obstructive pulmonary disease, unspecified: Secondary | ICD-10-CM | POA: Diagnosis not present

## 2019-09-23 DIAGNOSIS — E11621 Type 2 diabetes mellitus with foot ulcer: Secondary | ICD-10-CM | POA: Diagnosis not present

## 2019-09-23 DIAGNOSIS — E1142 Type 2 diabetes mellitus with diabetic polyneuropathy: Secondary | ICD-10-CM | POA: Diagnosis not present

## 2019-09-24 DIAGNOSIS — M545 Low back pain: Secondary | ICD-10-CM

## 2019-09-24 DIAGNOSIS — M7989 Other specified soft tissue disorders: Secondary | ICD-10-CM

## 2019-09-24 DIAGNOSIS — L600 Ingrowing nail: Secondary | ICD-10-CM

## 2019-09-24 DIAGNOSIS — M65879 Other synovitis and tenosynovitis, unspecified ankle and foot: Secondary | ICD-10-CM

## 2019-09-24 DIAGNOSIS — Z90711 Acquired absence of uterus with remaining cervical stump: Secondary | ICD-10-CM

## 2019-09-24 DIAGNOSIS — Z79891 Long term (current) use of opiate analgesic: Secondary | ICD-10-CM

## 2019-09-24 DIAGNOSIS — M26629 Arthralgia of temporomandibular joint, unspecified side: Secondary | ICD-10-CM

## 2019-09-24 DIAGNOSIS — H6120 Impacted cerumen, unspecified ear: Secondary | ICD-10-CM

## 2019-09-24 DIAGNOSIS — E039 Hypothyroidism, unspecified: Secondary | ICD-10-CM

## 2019-09-24 DIAGNOSIS — M79609 Pain in unspecified limb: Secondary | ICD-10-CM

## 2019-09-24 DIAGNOSIS — E669 Obesity, unspecified: Secondary | ICD-10-CM

## 2019-09-24 DIAGNOSIS — E11621 Type 2 diabetes mellitus with foot ulcer: Secondary | ICD-10-CM

## 2019-09-24 DIAGNOSIS — M25529 Pain in unspecified elbow: Secondary | ICD-10-CM

## 2019-09-24 DIAGNOSIS — E538 Deficiency of other specified B group vitamins: Secondary | ICD-10-CM

## 2019-09-24 DIAGNOSIS — M21962 Unspecified acquired deformity of left lower leg: Secondary | ICD-10-CM

## 2019-09-24 DIAGNOSIS — G40909 Epilepsy, unspecified, not intractable, without status epilepticus: Secondary | ICD-10-CM

## 2019-09-24 DIAGNOSIS — G2581 Restless legs syndrome: Secondary | ICD-10-CM

## 2019-09-24 DIAGNOSIS — E559 Vitamin D deficiency, unspecified: Secondary | ICD-10-CM

## 2019-09-24 DIAGNOSIS — E1121 Type 2 diabetes mellitus with diabetic nephropathy: Secondary | ICD-10-CM

## 2019-09-24 DIAGNOSIS — M898X7 Other specified disorders of bone, ankle and foot: Secondary | ICD-10-CM

## 2019-09-24 DIAGNOSIS — M19071 Primary osteoarthritis, right ankle and foot: Secondary | ICD-10-CM

## 2019-09-24 DIAGNOSIS — J309 Allergic rhinitis, unspecified: Secondary | ICD-10-CM

## 2019-09-24 DIAGNOSIS — K5903 Drug induced constipation: Secondary | ICD-10-CM

## 2019-09-24 DIAGNOSIS — B379 Candidiasis, unspecified: Secondary | ICD-10-CM

## 2019-09-24 DIAGNOSIS — G43909 Migraine, unspecified, not intractable, without status migrainosus: Secondary | ICD-10-CM

## 2019-09-24 DIAGNOSIS — F411 Generalized anxiety disorder: Secondary | ICD-10-CM

## 2019-09-24 DIAGNOSIS — F17210 Nicotine dependence, cigarettes, uncomplicated: Secondary | ICD-10-CM

## 2019-09-24 DIAGNOSIS — L97511 Non-pressure chronic ulcer of other part of right foot limited to breakdown of skin: Secondary | ICD-10-CM

## 2019-09-24 DIAGNOSIS — E875 Hyperkalemia: Secondary | ICD-10-CM

## 2019-09-24 DIAGNOSIS — H9201 Otalgia, right ear: Secondary | ICD-10-CM

## 2019-09-24 DIAGNOSIS — M109 Gout, unspecified: Secondary | ICD-10-CM

## 2019-09-24 DIAGNOSIS — Z6833 Body mass index (BMI) 33.0-33.9, adult: Secondary | ICD-10-CM

## 2019-09-24 DIAGNOSIS — L570 Actinic keratosis: Secondary | ICD-10-CM

## 2019-09-24 DIAGNOSIS — J449 Chronic obstructive pulmonary disease, unspecified: Secondary | ICD-10-CM

## 2019-09-24 DIAGNOSIS — K529 Noninfective gastroenteritis and colitis, unspecified: Secondary | ICD-10-CM

## 2019-09-24 DIAGNOSIS — Z89421 Acquired absence of other right toe(s): Secondary | ICD-10-CM

## 2019-09-24 DIAGNOSIS — G5752 Tarsal tunnel syndrome, left lower limb: Secondary | ICD-10-CM

## 2019-09-24 DIAGNOSIS — I119 Hypertensive heart disease without heart failure: Secondary | ICD-10-CM

## 2019-09-24 DIAGNOSIS — Z794 Long term (current) use of insulin: Secondary | ICD-10-CM

## 2019-09-24 DIAGNOSIS — Z9181 History of falling: Secondary | ICD-10-CM

## 2019-09-24 DIAGNOSIS — G8929 Other chronic pain: Secondary | ICD-10-CM

## 2019-09-24 DIAGNOSIS — E785 Hyperlipidemia, unspecified: Secondary | ICD-10-CM

## 2019-09-24 DIAGNOSIS — E11610 Type 2 diabetes mellitus with diabetic neuropathic arthropathy: Secondary | ICD-10-CM

## 2019-09-24 DIAGNOSIS — R000 Tachycardia, unspecified: Secondary | ICD-10-CM

## 2019-09-24 DIAGNOSIS — Z981 Arthrodesis status: Secondary | ICD-10-CM

## 2019-09-24 DIAGNOSIS — N179 Acute kidney failure, unspecified: Secondary | ICD-10-CM

## 2019-09-24 DIAGNOSIS — E1142 Type 2 diabetes mellitus with diabetic polyneuropathy: Secondary | ICD-10-CM

## 2019-09-24 DIAGNOSIS — M7751 Other enthesopathy of right foot: Secondary | ICD-10-CM

## 2019-09-24 DIAGNOSIS — Z7982 Long term (current) use of aspirin: Secondary | ICD-10-CM

## 2019-09-30 DIAGNOSIS — B379 Candidiasis, unspecified: Secondary | ICD-10-CM | POA: Diagnosis not present

## 2019-09-30 DIAGNOSIS — L97511 Non-pressure chronic ulcer of other part of right foot limited to breakdown of skin: Secondary | ICD-10-CM | POA: Diagnosis not present

## 2019-09-30 DIAGNOSIS — E1142 Type 2 diabetes mellitus with diabetic polyneuropathy: Secondary | ICD-10-CM | POA: Diagnosis not present

## 2019-09-30 DIAGNOSIS — J449 Chronic obstructive pulmonary disease, unspecified: Secondary | ICD-10-CM | POA: Diagnosis not present

## 2019-09-30 DIAGNOSIS — E11621 Type 2 diabetes mellitus with foot ulcer: Secondary | ICD-10-CM | POA: Diagnosis not present

## 2019-10-02 DIAGNOSIS — E1142 Type 2 diabetes mellitus with diabetic polyneuropathy: Secondary | ICD-10-CM | POA: Diagnosis not present

## 2019-10-02 DIAGNOSIS — L97511 Non-pressure chronic ulcer of other part of right foot limited to breakdown of skin: Secondary | ICD-10-CM | POA: Diagnosis not present

## 2019-10-02 DIAGNOSIS — E11621 Type 2 diabetes mellitus with foot ulcer: Secondary | ICD-10-CM | POA: Diagnosis not present

## 2019-10-02 DIAGNOSIS — J449 Chronic obstructive pulmonary disease, unspecified: Secondary | ICD-10-CM | POA: Diagnosis not present

## 2019-10-02 DIAGNOSIS — B379 Candidiasis, unspecified: Secondary | ICD-10-CM | POA: Diagnosis not present

## 2019-10-03 ENCOUNTER — Ambulatory Visit: Payer: Medicare Other | Admitting: Podiatry

## 2019-10-04 ENCOUNTER — Other Ambulatory Visit: Payer: Self-pay

## 2019-10-04 ENCOUNTER — Ambulatory Visit (INDEPENDENT_AMBULATORY_CARE_PROVIDER_SITE_OTHER): Payer: Medicare Other | Admitting: Podiatry

## 2019-10-04 DIAGNOSIS — Z9889 Other specified postprocedural states: Secondary | ICD-10-CM

## 2019-10-07 DIAGNOSIS — J449 Chronic obstructive pulmonary disease, unspecified: Secondary | ICD-10-CM | POA: Diagnosis not present

## 2019-10-07 DIAGNOSIS — E11621 Type 2 diabetes mellitus with foot ulcer: Secondary | ICD-10-CM | POA: Diagnosis not present

## 2019-10-07 DIAGNOSIS — L97511 Non-pressure chronic ulcer of other part of right foot limited to breakdown of skin: Secondary | ICD-10-CM | POA: Diagnosis not present

## 2019-10-07 DIAGNOSIS — E1142 Type 2 diabetes mellitus with diabetic polyneuropathy: Secondary | ICD-10-CM | POA: Diagnosis not present

## 2019-10-07 DIAGNOSIS — B379 Candidiasis, unspecified: Secondary | ICD-10-CM | POA: Diagnosis not present

## 2019-10-07 NOTE — Progress Notes (Signed)
Subjective:  Patient ID: Katherine Herrera, female    DOB: 1966-01-20,  MRN: [id number]  Chief Complaint  Patient presents with  . Wound Check    Pt states occasional pain 4-5 days. Denies fever/nausea/vomiting/chills/drainage. No other concerns.    53 y.o. female presents for wound care.  States wound has been hurting more but otherwise denies issues  Review of Systems: Negative except as noted in the HPI. Denies N/V/F/Ch.  Past Medical History:  Diagnosis Date  . Anxiety   . COPD (chronic obstructive pulmonary disease) ([place])   . DM (diabetes mellitus) ([place])    type 2  . Family history of adverse reaction to anesthesia    mother hard to wake up  . Gout    possible  . Hyperlipidemia   . Hypothyroidism   . LBP (low back pain)    DR Patrice Paradise  . Migraine   . Peripheral neuropathy   . Syncope    poss conversion disorder vs psychogenic seizures  . Tachycardia    diet coke overuse?  Marland Kitchen Tenosynovitis of foot and ankle 09/03/2013  . TTS (tarsal tunnel syndrome) 2010   Left Foot [place] in [place]  . Vitamin D deficiency   . Wound, open    both feet outer parts no drainage, changes dressing 1 day, wounds dime size for last month    Current Outpatient Medications:  .  aspirin (BAYER ASPIRIN) 325 MG tablet, Take 1 tablet (325 mg total) by mouth daily., Disp: 100 tablet, Rfl: 3 .  B-D INS SYR ULTRAFINE 1CC/31G 31G X 5/16" 1 ML MISC, USE AS DIRECTED, Disp: 100 each, Rfl: 0 .  baclofen (LIORESAL) 10 MG tablet, TAKE 2 TABLETS BY MOUTH  TWICE DAILY, Disp: 180 tablet, Rfl: 1 .  Blood Glucose Monitoring Suppl (ONETOUCH VERIO) w/Device KIT, , Disp: , Rfl:  .  cholecalciferol (VITAMIN D) 25 MCG (1000 UT) tablet, Take 2 tablets (2,000 Units total) by mouth daily., Disp: 100 tablet, Rfl: 3 .  clonazePAM (KLONOPIN) 0.5 MG tablet, Take 1 tablet (0.5 mg total) by mouth 2 (two) times daily as needed for anxiety., Disp: 30 tablet, Rfl: 1 .  ezetimibe (ZETIA) 10 MG tablet, Take 1 tablet (10 mg total) by  mouth daily., Disp: 90 tablet, Rfl: 3 .  gabapentin (NEURONTIN) 800 MG tablet, TAKE 1 TABLET BY MOUTH 4  TIMES DAILY, Disp: 360 tablet, Rfl: 3 .  glucose blood (ONETOUCH VERIO) test strip, Use to check blood sugar 2 times per day., Disp: 100 each, Rfl: 12 .  HYDROmorphone (DILAUDID) 4 MG tablet, Take 1 tablet (4 mg total) by mouth 5 (five) times daily as needed for severe pain. Every 4 hours as needed for severe pain, not to exceed 5 times daily, Disp: 30 tablet, Rfl: 0 .  insulin NPH-regular Human (70-30) 100 UNIT/ML injection, Inject 10 Units into the skin 2 (two) times daily with a meal., Disp: 10 mL, Rfl: 11 .  Insulin Syringe-Needle U-100 (SURE COMFORT INSULIN SYRINGE) 31G X 5/16" 0.5 ML MISC, USE AS DIRECTED WITH INSULIN 4 TIMES A DAY, Disp: 400 each, Rfl: 2 .  lamoTRIgine (LAMICTAL) 25 MG tablet, TAKE 1 TABLET BY MOUTH TWO  TIMES DAILY, Disp: 180 tablet, Rfl: 3 .  Lancet Devices (ACCU-CHEK SOFTCLIX) lancets, Test two times daily, Disp: 100 each, Rfl: 12 .  levothyroxine (SYNTHROID) 75 MCG tablet, TAKE 1 TABLET BY MOUTH  DAILY, Disp: 90 tablet, Rfl: 3 .  lisinopril (ZESTRIL) 10 MG tablet, Take 10 mg by mouth daily.,  Disp: , Rfl:  .  loratadine (CLARITIN) 10 MG tablet, Take 10 mg by mouth daily as needed for allergies., Disp: , Rfl:  .  oxyCODONE (ROXICODONE) 15 MG immediate release tablet, TAKE 1 TABLET (15 MG TOTAL) BY MOUTH EVERY 6 (SIX) HOURS AS NEEDED, Disp: 120 tablet, Rfl: 0 .  oxyCODONE (ROXICODONE) 15 MG immediate release tablet, Take 1 tablet (15 mg total) by mouth every 6 (six) hours as needed., Disp: 120 tablet, Rfl: 0 .  oxyCODONE (ROXICODONE) 15 MG immediate release tablet, Take 1 tablet (15 mg total) by mouth every 6 (six) hours as needed for pain., Disp: 120 tablet, Rfl: 0 .  polyethylene glycol (MIRALAX / GLYCOLAX) 17 g packet, Take 17 g by mouth daily as needed for moderate constipation., Disp: 14 each, Rfl: 0 .  rOPINIRole (REQUIP) 0.5 MG tablet, TAKE 1 TABLET BY MOUTH AT   BEDTIME, Disp: 90 tablet, Rfl: 3 .  senna-docusate (SENOKOT-S) 8.6-50 MG tablet, Take 1 tablet by mouth 2 (two) times daily. (Patient taking differently: Take 1 tablet by mouth at bedtime. ), Disp: 20 tablet, Rfl: 0 .  vitamin B-12 (CYANOCOBALAMIN) 1000 MCG tablet, Take 1,000 mcg by mouth daily.  , Disp: , Rfl:   Social History   Tobacco Use  Smoking Status Current Every Day Smoker  . Packs/day: 1.00  Smokeless Tobacco Never Used    Allergies  Allergen Reactions  . Hydromorphone Hcl Itching    Patient has been tolerating Hydromorphone tablets without adverse effect (07/09/19)  . Cephalexin Nausea And Vomiting  . Demerol  [Meperidine Hcl] Rash  . Lovastatin Other (See Comments)    Other reaction(s): Unknown (See Comments) Possible myalgia Unknown   . Metformin And Related Nausea And Vomiting  . Sulfamethoxazole-Trimethoprim     Other reaction(s): Unknown DILI, pancreatitis  . Crestor [Rosuvastatin Calcium]     myalgia  . Niacin Other (See Comments)    Unknown   . Paroxetine Other (See Comments)    Other reaction(s): Unknown (See Comments) unknown Unknown    Objective:  There were no vitals filed for this visit. There is no height or weight on file to calculate BMI. Constitutional Well developed. Well nourished.  Vascular Dorsalis pedis pulses palpable bilaterally. Posterior tibial pulses palpable bilaterally. Capillary refill normal to all digits.  No cyanosis or clubbing noted. Pedal hair growth normal.  Neurologic Normal speech. Oriented to person, place, and time. Protective sensation absent  Dermatologic Wound Location: Right 5th met lateral Wound Base: Granular/Healthy Peri-wound: Macerated Exudate: Scant/small amount Serosanguinous exudate Wound Measurements: - 1 x 2  Orthopedic: No pain to palpation either foot.   Radiographs: None Assessment:   1. Post-operative state    Plan:  Patient was evaluated and treated and all questions answered.  Ulcer  right foot -Wound continues to improve dressed with Medihoney and dry sterile dressing.  Follow-up in 1 week for recheck   Return in about 1 week (around 10/11/2019) for Wound Care, Right.

## 2019-10-09 DIAGNOSIS — B379 Candidiasis, unspecified: Secondary | ICD-10-CM | POA: Diagnosis not present

## 2019-10-09 DIAGNOSIS — J449 Chronic obstructive pulmonary disease, unspecified: Secondary | ICD-10-CM | POA: Diagnosis not present

## 2019-10-09 DIAGNOSIS — L97511 Non-pressure chronic ulcer of other part of right foot limited to breakdown of skin: Secondary | ICD-10-CM | POA: Diagnosis not present

## 2019-10-09 DIAGNOSIS — E11621 Type 2 diabetes mellitus with foot ulcer: Secondary | ICD-10-CM | POA: Diagnosis not present

## 2019-10-09 DIAGNOSIS — E1142 Type 2 diabetes mellitus with diabetic polyneuropathy: Secondary | ICD-10-CM | POA: Diagnosis not present

## 2019-10-19 ENCOUNTER — Other Ambulatory Visit: Payer: Self-pay | Admitting: Endocrinology

## 2019-10-19 ENCOUNTER — Other Ambulatory Visit: Payer: Self-pay | Admitting: Internal Medicine

## 2019-10-24 DIAGNOSIS — E11621 Type 2 diabetes mellitus with foot ulcer: Secondary | ICD-10-CM | POA: Diagnosis not present

## 2019-11-07 ENCOUNTER — Other Ambulatory Visit: Payer: Self-pay | Admitting: Internal Medicine

## 2019-11-07 NOTE — Telephone Encounter (Signed)
Requested medication (s) are due for refill today: yes  Requested medication (s) are on the active medication list: yes  Last refill:  08/26/2019  Future visit scheduled: yes  Notes to clinic:  This refill cannot be delegated  Review for refill   Requested Prescriptions  Pending Prescriptions Disp Refills   baclofen (LIORESAL) 10 MG tablet 180 tablet 1    Sig: Take 2 tablets (20 mg total) by mouth 2 (two) times daily.      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

## 2019-11-07 NOTE — Telephone Encounter (Signed)
Copied from [place] [id number]. Topic: Quick Communication - Rx Refill/Question >> Nov 07, 2019  2:46 PM Leward Quan A wrote: Medication: baclofen (LIORESAL) 10 MG tablet   Has the patient contacted their pharmacy? Yes.   (Agent: If no, request that the patient contact the pharmacy for the refill.) (Agent: If yes, when and what did the pharmacy advise?)  Preferred Pharmacy (with phone number or street name): [place], [place] [employer]  Phone:  [phone] Fax:  [phone]     Agent: Please be advised that RX refills may take up to 3 business days. We ask that you follow-up with your pharmacy.

## 2019-11-08 ENCOUNTER — Ambulatory Visit: Payer: Medicare Other | Admitting: Podiatry

## 2019-11-08 MED ORDER — BACLOFEN 10 MG PO TABS: 20.0000 mg | ORAL_TABLET | Freq: Two times a day (BID) | ORAL | 1 refills | Status: DC

## 2019-11-24 DIAGNOSIS — E11621 Type 2 diabetes mellitus with foot ulcer: Secondary | ICD-10-CM | POA: Diagnosis not present

## 2019-11-29 ENCOUNTER — Other Ambulatory Visit: Payer: Self-pay

## 2019-11-29 ENCOUNTER — Ambulatory Visit (INDEPENDENT_AMBULATORY_CARE_PROVIDER_SITE_OTHER): Payer: Medicare Other | Admitting: Podiatry

## 2019-11-29 DIAGNOSIS — E08621 Diabetes mellitus due to underlying condition with foot ulcer: Secondary | ICD-10-CM | POA: Diagnosis not present

## 2019-11-29 DIAGNOSIS — L97413 Non-pressure chronic ulcer of right heel and midfoot with necrosis of muscle: Secondary | ICD-10-CM | POA: Diagnosis not present

## 2019-12-02 ENCOUNTER — Telehealth: Payer: Self-pay | Admitting: Internal Medicine

## 2019-12-02 MED ORDER — CLONAZEPAM 0.5 MG PO TABS: 0.5000 mg | ORAL_TABLET | Freq: Two times a day (BID) | ORAL | 1 refills | Status: DC | PRN

## 2019-12-02 NOTE — Telephone Encounter (Signed)
Okay.  Thanks.

## 2019-12-02 NOTE — Telephone Encounter (Signed)
Patient is requesting a refill on the following medication.  clonazePAM (KLONOPIN) 0.5 MG tablet   [id number] [place], [place] [address] Phone:  [phone]  Fax:  [phone]

## 2019-12-02 NOTE — Telephone Encounter (Signed)
Check [place] registry last filled 07/01/2019.Marland KitchenJohny Herrera

## 2019-12-03 ENCOUNTER — Other Ambulatory Visit: Payer: Self-pay

## 2019-12-03 NOTE — Telephone Encounter (Signed)
Medication Refill - Medication: oxyCODONE (ROXICODONE) 15 MG immediate release tablet    Has the patient contacted their pharmacy? Yes.   (Agent: If no, request that the patient contact the pharmacy for the refill.) (Agent: If yes, when and what did the pharmacy advise?)  Preferred Pharmacy (with phone number or street name):  [id number] [place] [place]: Please be advised that RX refills may take up to 3 business days. We ask that you follow-up with your pharmacy.

## 2019-12-04 MED ORDER — CLONAZEPAM 0.5 MG PO TABS: 0.5000 mg | ORAL_TABLET | Freq: Two times a day (BID) | ORAL | 1 refills | Status: DC | PRN

## 2019-12-04 NOTE — Addendum Note (Signed)
Addended by: Cassandria Anger on: 12/04/2019 11:32 PM   Modules accepted: Orders

## 2019-12-04 NOTE — Telephone Encounter (Signed)
Pt calld back to check up on this. Thanks

## 2019-12-05 ENCOUNTER — Other Ambulatory Visit: Payer: Self-pay | Admitting: Internal Medicine

## 2019-12-05 MED ORDER — OXYCODONE HCL 15 MG PO TABS: ORAL_TABLET | ORAL | 0 refills | Status: DC

## 2019-12-12 ENCOUNTER — Other Ambulatory Visit: Payer: Self-pay

## 2019-12-12 ENCOUNTER — Ambulatory Visit (INDEPENDENT_AMBULATORY_CARE_PROVIDER_SITE_OTHER): Payer: Medicare Other | Admitting: Podiatry

## 2019-12-12 DIAGNOSIS — L97413 Non-pressure chronic ulcer of right heel and midfoot with necrosis of muscle: Secondary | ICD-10-CM | POA: Diagnosis not present

## 2019-12-12 DIAGNOSIS — E08621 Diabetes mellitus due to underlying condition with foot ulcer: Secondary | ICD-10-CM

## 2019-12-20 ENCOUNTER — Encounter: Payer: Self-pay | Admitting: Internal Medicine

## 2019-12-20 ENCOUNTER — Other Ambulatory Visit: Payer: Self-pay

## 2019-12-20 ENCOUNTER — Ambulatory Visit (INDEPENDENT_AMBULATORY_CARE_PROVIDER_SITE_OTHER): Payer: Medicare Other | Admitting: Internal Medicine

## 2019-12-20 DIAGNOSIS — E11621 Type 2 diabetes mellitus with foot ulcer: Secondary | ICD-10-CM | POA: Diagnosis not present

## 2019-12-20 DIAGNOSIS — E1140 Type 2 diabetes mellitus with diabetic neuropathy, unspecified: Secondary | ICD-10-CM

## 2019-12-20 DIAGNOSIS — M5440 Lumbago with sciatica, unspecified side: Secondary | ICD-10-CM

## 2019-12-20 DIAGNOSIS — E08621 Diabetes mellitus due to underlying condition with foot ulcer: Secondary | ICD-10-CM

## 2019-12-20 DIAGNOSIS — L97512 Non-pressure chronic ulcer of other part of right foot with fat layer exposed: Secondary | ICD-10-CM | POA: Diagnosis not present

## 2019-12-20 DIAGNOSIS — F411 Generalized anxiety disorder: Secondary | ICD-10-CM

## 2019-12-20 MED ORDER — OXYCODONE HCL 15 MG PO TABS: 15.0000 mg | ORAL_TABLET | Freq: Four times a day (QID) | ORAL | 0 refills | Status: DC | PRN

## 2019-12-20 MED ORDER — OXYCODONE HCL 15 MG PO TABS: ORAL_TABLET | ORAL | 0 refills | Status: DC

## 2019-12-20 NOTE — Assessment & Plan Note (Signed)
Follow-up with Dr. March Rummage.  Healing postop

## 2019-12-20 NOTE — Assessment & Plan Note (Addendum)
Her blood sugars are better per patient.  She continues to work with Dr. Michiel Sites on her sugar numbers

## 2019-12-20 NOTE — Progress Notes (Signed)
Virtual Visit via Video Note  I connected with Katherine Herrera on 12/20/19 at  3:00 PM EST by a video enabled telemedicine application and verified that I am speaking with the correct person using two identifiers.   I discussed the limitations of evaluation and management by telemedicine and the availability of in person appointments. The patient expressed understanding and agreed to proceed.  History of Present Illness: We need to follow-up on LBP, DM, anxiety f/u.  She continues to have postop foot pain  There has been no cough, chest pain, shortness of breath, abdominal pain, diarrhea, constipation.   Observations/Objective: The patient appears to be in no acute distress  Assessment and Plan:  See my Assessment and Plan. Follow Up Instructions:    I discussed the assessment and treatment plan with the patient. The patient was provided an opportunity to ask questions and all were answered. The patient agreed with the plan and demonstrated an understanding of the instructions.   The patient was advised to call back or seek an in-person evaluation if the symptoms worsen or if the condition fails to improve as anticipated.  I provided face-to-face time during this encounter. We were at different locations.   Walker Kehr, MD

## 2019-12-20 NOTE — Assessment & Plan Note (Signed)
  Continue with oxycodone as needed Do not take with clonazepam  Potential benefits of a long term opioids use as well as potential risks (i.e. addiction risk, apnea etc) and complications (i.e. Somnolence, constipation and others) were explained to the patient and were aknowledged.

## 2019-12-20 NOTE — Assessment & Plan Note (Signed)
On clonazepam Do not take with oxycodone

## 2019-12-25 DIAGNOSIS — E11621 Type 2 diabetes mellitus with foot ulcer: Secondary | ICD-10-CM | POA: Diagnosis not present

## 2019-12-26 ENCOUNTER — Ambulatory Visit (INDEPENDENT_AMBULATORY_CARE_PROVIDER_SITE_OTHER): Payer: Medicare Other | Admitting: Podiatry

## 2019-12-26 ENCOUNTER — Other Ambulatory Visit: Payer: Self-pay

## 2019-12-26 ENCOUNTER — Ambulatory Visit: Payer: Medicare Other | Admitting: Internal Medicine

## 2019-12-26 DIAGNOSIS — E08621 Diabetes mellitus due to underlying condition with foot ulcer: Secondary | ICD-10-CM

## 2019-12-26 DIAGNOSIS — L97413 Non-pressure chronic ulcer of right heel and midfoot with necrosis of muscle: Secondary | ICD-10-CM | POA: Diagnosis not present

## 2019-12-26 NOTE — Progress Notes (Signed)
  Subjective:  Patient ID: Katherine Herrera, female    DOB: Mar 14, 1966,  MRN: [id number]  Chief Complaint  Patient presents with  . Wound Check    Pt states wound has "deep feeling pain". Denies fever/chills/nausea/vomiting.    54 y.o. female presents for wound care. Hx confirmed with patient.  Objective:  Physical Exam: Wound Location: right lateral foot Wound Measurement: 4x2.5 Wound Base: Granular/Healthy Peri-wound: Macerated, Calloused Exudate: Scant/small amount Serosanguinous exudate wound without warmth, erythema, signs of acute infection    Assessment:   1. 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  Ulcer Right foot -Dressing applied consisting of medihoney -Offload ulcer with surgical shoe, padding applied to surgical shoe -Wound cleansed and debrided  Procedure: Excisional Debridement of Wound Rationale: Removal of non-viable soft tissue from the wound to promote healing.  Anesthesia: none Pre-Debridement Wound Measurements: overlying hyperkeratosis  Post-Debridement Wound Measurements: 4 cm x 2.5 cm x 0.3 cm  Type of Debridement: Sharp Excisional Tissue Removed: Non-viable soft tissue Depth of Debridement: subcutaneous tissue. Technique: Sharp excisional debridement to bleeding, viable wound base.  Dressing: Dry, sterile, compression dressing. Disposition: Patient tolerated procedure well. Patient to return in 1 week for follow-up.  Return in about 2 weeks (around 01/09/2020).

## 2019-12-30 ENCOUNTER — Other Ambulatory Visit: Payer: Self-pay | Admitting: Podiatry

## 2019-12-30 ENCOUNTER — Telehealth: Payer: Self-pay

## 2019-12-30 NOTE — Telephone Encounter (Signed)
New message    The patient calling   C/o infection in right leg , burning and chills,    Appt offer to the patient, the patient declined appt stating she in  [place]  asking if Dr. Alain Marion would call her in something.   No emergency room visit   No urgent care visit  [facility] [phone]

## 2019-12-30 NOTE — Telephone Encounter (Signed)
"  My foot has gotten infected over the weekend, and I needed you to call in an antibiotic for me. [phone] [address]( I need it ASAP, I cant wait 24hrs)"

## 2019-12-30 NOTE — Telephone Encounter (Signed)
Pt called and said that her leg is getting worse and shes not in town to come into office she would like an antibiotic called in pt states she having chills

## 2019-12-31 DIAGNOSIS — Z885 Allergy status to narcotic agent status: Secondary | ICD-10-CM | POA: Diagnosis not present

## 2019-12-31 DIAGNOSIS — Z981 Arthrodesis status: Secondary | ICD-10-CM | POA: Diagnosis not present

## 2019-12-31 DIAGNOSIS — A419 Sepsis, unspecified organism: Secondary | ICD-10-CM | POA: Insufficient documentation

## 2019-12-31 DIAGNOSIS — Z794 Long term (current) use of insulin: Secondary | ICD-10-CM | POA: Diagnosis not present

## 2019-12-31 DIAGNOSIS — L03116 Cellulitis of left lower limb: Secondary | ICD-10-CM | POA: Diagnosis not present

## 2019-12-31 DIAGNOSIS — E11621 Type 2 diabetes mellitus with foot ulcer: Secondary | ICD-10-CM | POA: Diagnosis not present

## 2019-12-31 DIAGNOSIS — M79604 Pain in right leg: Secondary | ICD-10-CM | POA: Diagnosis not present

## 2019-12-31 DIAGNOSIS — A46 Erysipelas: Secondary | ICD-10-CM | POA: Diagnosis not present

## 2019-12-31 DIAGNOSIS — E039 Hypothyroidism, unspecified: Secondary | ICD-10-CM | POA: Diagnosis not present

## 2019-12-31 DIAGNOSIS — L03115 Cellulitis of right lower limb: Secondary | ICD-10-CM | POA: Diagnosis not present

## 2019-12-31 DIAGNOSIS — B954 Other streptococcus as the cause of diseases classified elsewhere: Secondary | ICD-10-CM | POA: Diagnosis not present

## 2019-12-31 DIAGNOSIS — E785 Hyperlipidemia, unspecified: Secondary | ICD-10-CM | POA: Diagnosis not present

## 2019-12-31 DIAGNOSIS — M7989 Other specified soft tissue disorders: Secondary | ICD-10-CM | POA: Diagnosis not present

## 2019-12-31 DIAGNOSIS — R6521 Severe sepsis with septic shock: Secondary | ICD-10-CM | POA: Diagnosis not present

## 2019-12-31 DIAGNOSIS — N179 Acute kidney failure, unspecified: Secondary | ICD-10-CM | POA: Diagnosis not present

## 2019-12-31 DIAGNOSIS — I872 Venous insufficiency (chronic) (peripheral): Secondary | ICD-10-CM | POA: Diagnosis not present

## 2019-12-31 DIAGNOSIS — F17210 Nicotine dependence, cigarettes, uncomplicated: Secondary | ICD-10-CM | POA: Diagnosis not present

## 2019-12-31 DIAGNOSIS — E11628 Type 2 diabetes mellitus with other skin complications: Secondary | ICD-10-CM | POA: Diagnosis not present

## 2019-12-31 DIAGNOSIS — L97519 Non-pressure chronic ulcer of other part of right foot with unspecified severity: Secondary | ICD-10-CM | POA: Diagnosis not present

## 2019-12-31 DIAGNOSIS — L089 Local infection of the skin and subcutaneous tissue, unspecified: Secondary | ICD-10-CM | POA: Diagnosis not present

## 2019-12-31 DIAGNOSIS — Z79899 Other long term (current) drug therapy: Secondary | ICD-10-CM | POA: Diagnosis not present

## 2019-12-31 DIAGNOSIS — R230 Cyanosis: Secondary | ICD-10-CM | POA: Diagnosis not present

## 2019-12-31 DIAGNOSIS — L97419 Non-pressure chronic ulcer of right heel and midfoot with unspecified severity: Secondary | ICD-10-CM | POA: Diagnosis not present

## 2019-12-31 DIAGNOSIS — E1140 Type 2 diabetes mellitus with diabetic neuropathy, unspecified: Secondary | ICD-10-CM | POA: Diagnosis not present

## 2019-12-31 DIAGNOSIS — Z7982 Long term (current) use of aspirin: Secondary | ICD-10-CM | POA: Diagnosis not present

## 2019-12-31 DIAGNOSIS — L97412 Non-pressure chronic ulcer of right heel and midfoot with fat layer exposed: Secondary | ICD-10-CM | POA: Diagnosis not present

## 2019-12-31 DIAGNOSIS — R9431 Abnormal electrocardiogram [ECG] [EKG]: Secondary | ICD-10-CM | POA: Diagnosis not present

## 2019-12-31 DIAGNOSIS — Z20822 Contact with and (suspected) exposure to covid-19: Secondary | ICD-10-CM | POA: Diagnosis not present

## 2019-12-31 DIAGNOSIS — E871 Hypo-osmolality and hyponatremia: Secondary | ICD-10-CM | POA: Diagnosis not present

## 2019-12-31 DIAGNOSIS — E1165 Type 2 diabetes mellitus with hyperglycemia: Secondary | ICD-10-CM | POA: Diagnosis not present

## 2019-12-31 DIAGNOSIS — I959 Hypotension, unspecified: Secondary | ICD-10-CM | POA: Diagnosis not present

## 2019-12-31 MED ORDER — CLINDAMYCIN HCL 300 MG PO CAPS: 300.0000 mg | ORAL_CAPSULE | Freq: Two times a day (BID) | ORAL | 0 refills | Status: DC

## 2019-12-31 MED ORDER — CIPROFLOXACIN HCL 500 MG PO TABS: 500.0000 mg | ORAL_TABLET | Freq: Two times a day (BID) | ORAL | 0 refills | Status: DC

## 2019-12-31 NOTE — Telephone Encounter (Signed)
I've called several times today and started calling this morning. My leg has an infection and I really need some strong antibiotics. [facility] is [phone]. Thank you.

## 2019-12-31 NOTE — Addendum Note (Signed)
Addended by: Hardie Pulley on: 12/31/2019 10:17 AM   Modules accepted: Orders

## 2019-12-31 NOTE — Telephone Encounter (Signed)
Pt went to ED

## 2019-12-31 NOTE — Telephone Encounter (Signed)
Attempted to call patient to see what was going on with her foot. No answer, voicemail left.   Called in Clindamycin and Cipro. Will try to contact patient again.

## 2020-01-01 ENCOUNTER — Telehealth: Payer: Self-pay | Admitting: *Deleted

## 2020-01-01 DIAGNOSIS — L089 Local infection of the skin and subcutaneous tissue, unspecified: Secondary | ICD-10-CM | POA: Insufficient documentation

## 2020-01-01 MED ORDER — GENERIC EXTERNAL MEDICATION
750.00 | Status: DC
Start: 2020-01-01 — End: 2020-01-01

## 2020-01-01 MED ORDER — GENERIC EXTERNAL MEDICATION
0.02 | Status: DC
Start: ? — End: 2020-01-01

## 2020-01-01 MED ORDER — PIPERACILLIN SOD-TAZOBACTAM SO 3.375 (3-0.375) G IV SOLR
3.38 | INTRAVENOUS | Status: DC
Start: 2020-01-01 — End: 2020-01-01

## 2020-01-01 MED ORDER — ONDANSETRON HCL 4 MG/2ML IJ SOLN
4.00 | INTRAMUSCULAR | Status: DC
Start: ? — End: 2020-01-01

## 2020-01-01 MED ORDER — SODIUM CHLORIDE 0.9 % IV SOLN
125.00 | INTRAVENOUS | Status: DC
Start: ? — End: 2020-01-01

## 2020-01-01 MED ORDER — TRAMADOL HCL 50 MG PO TABS
50.00 | ORAL_TABLET | ORAL | Status: DC
Start: ? — End: 2020-01-01

## 2020-01-01 MED ORDER — NICOTINE 21 MG/24HR TD PT24
1.00 | MEDICATED_PATCH | TRANSDERMAL | Status: DC
Start: 2020-01-04 — End: 2020-01-01

## 2020-01-01 MED ORDER — NORMAL SALINE FLUSH 0.9 % IV SOLN
10.00 | INTRAVENOUS | Status: DC
Start: 2020-01-04 — End: 2020-01-01

## 2020-01-01 MED ORDER — POLYETHYLENE GLYCOL 3350 17 GM/SCOOP PO POWD
17.00 | ORAL | Status: DC
Start: 2020-01-01 — End: 2020-01-01

## 2020-01-01 MED ORDER — LAMOTRIGINE 25 MG PO TABS
25.00 | ORAL_TABLET | ORAL | Status: DC
Start: 2020-01-05 — End: 2020-01-01

## 2020-01-01 MED ORDER — LEVOTHYROXINE SODIUM 75 MCG PO TABS
75.00 | ORAL_TABLET | ORAL | Status: DC
Start: 2020-01-05 — End: 2020-01-01

## 2020-01-01 MED ORDER — ENOXAPARIN SODIUM 30 MG/0.3ML ~~LOC~~ SOLN
30.00 | SUBCUTANEOUS | Status: DC
Start: 2020-01-01 — End: 2020-01-01

## 2020-01-01 MED ORDER — CYANOCOBALAMIN 500 MCG PO TABS
1000.00 | ORAL_TABLET | ORAL | Status: DC
Start: 2020-01-05 — End: 2020-01-01

## 2020-01-01 MED ORDER — OXYCODONE HCL 5 MG PO TABS
15.00 | ORAL_TABLET | ORAL | Status: DC
Start: ? — End: 2020-01-01

## 2020-01-01 MED ORDER — ROPINIROLE HCL 0.25 MG PO TABS
0.50 | ORAL_TABLET | ORAL | Status: DC
Start: 2020-01-04 — End: 2020-01-01

## 2020-01-01 MED ORDER — NORMAL SALINE FLUSH 0.9 % IV SOLN
10.00 | INTRAVENOUS | Status: DC
Start: ? — End: 2020-01-01

## 2020-01-01 MED ORDER — GLUCAGON (RDNA) 1 MG IJ KIT
1.00 | PACK | INTRAMUSCULAR | Status: DC
Start: ? — End: 2020-01-01

## 2020-01-01 MED ORDER — CLINDAMYCIN PHOSPHATE IN D5W 900 MG/50ML IV SOLN
900.00 | INTRAVENOUS | Status: DC
Start: 2020-01-01 — End: 2020-01-01

## 2020-01-01 MED ORDER — GLUCOSE 40 % PO GEL
15.00 | ORAL | Status: DC
Start: ? — End: 2020-01-01

## 2020-01-01 MED ORDER — MUPIROCIN 2 % EX OINT
TOPICAL_OINTMENT | CUTANEOUS | Status: DC
Start: 2020-01-05 — End: 2020-01-01

## 2020-01-01 MED ORDER — CHOLECALCIFEROL 25 MCG (1000 UT) PO TABS
1000.00 | ORAL_TABLET | ORAL | Status: DC
Start: 2020-01-05 — End: 2020-01-01

## 2020-01-01 MED ORDER — INSULIN LISPRO 100 UNIT/ML ~~LOC~~ SOLN
0.00 | SUBCUTANEOUS | Status: DC
Start: 2020-01-04 — End: 2020-01-01

## 2020-01-01 MED ORDER — INSULIN DETEMIR 100 UNIT/ML ~~LOC~~ SOLN
20.00 | SUBCUTANEOUS | Status: DC
Start: 2020-01-04 — End: 2020-01-01

## 2020-01-01 MED ORDER — DEXTROSE 50 % IV SOLN
25.00 | INTRAVENOUS | Status: DC
Start: ? — End: 2020-01-01

## 2020-01-01 MED ORDER — GABAPENTIN 300 MG PO CAPS
300.00 | ORAL_CAPSULE | ORAL | Status: DC
Start: 2020-01-01 — End: 2020-01-01

## 2020-01-01 NOTE — Telephone Encounter (Signed)
Left message informing pt Dr.Price was trying to get in touch with her to discuss her symptoms, and to call me [phone] or Dr. March Rummage may try to call again.

## 2020-01-02 DIAGNOSIS — R230 Cyanosis: Secondary | ICD-10-CM | POA: Insufficient documentation

## 2020-01-02 DIAGNOSIS — L0390 Cellulitis, unspecified: Secondary | ICD-10-CM | POA: Insufficient documentation

## 2020-01-02 NOTE — Telephone Encounter (Signed)
Can we call her today?

## 2020-01-02 NOTE — Telephone Encounter (Signed)
Left message informing pt Dr. March Rummage wanted to make sure she had begun the Clindamycin and the status of her foot.

## 2020-01-05 MED ORDER — GABAPENTIN 300 MG PO CAPS
300.00 | ORAL_CAPSULE | ORAL | Status: DC
Start: 2020-01-04 — End: 2020-01-05

## 2020-01-05 MED ORDER — BISACODYL 5 MG PO TBEC
10.00 | DELAYED_RELEASE_TABLET | ORAL | Status: DC
Start: 2020-01-05 — End: 2020-01-05

## 2020-01-05 MED ORDER — ENOXAPARIN SODIUM 40 MG/0.4ML ~~LOC~~ SOLN
40.00 | SUBCUTANEOUS | Status: DC
Start: 2020-01-04 — End: 2020-01-05

## 2020-01-05 MED ORDER — CLONAZEPAM 0.5 MG PO TABS
0.50 | ORAL_TABLET | ORAL | Status: DC
Start: ? — End: 2020-01-05

## 2020-01-05 MED ORDER — DSS 100 MG PO CAPS
200.00 | ORAL_CAPSULE | ORAL | Status: DC
Start: 2020-01-05 — End: 2020-01-05

## 2020-01-05 MED ORDER — CEFDINIR 300 MG PO CAPS
300.00 | ORAL_CAPSULE | ORAL | Status: DC
Start: 2020-01-04 — End: 2020-01-05

## 2020-01-05 NOTE — Progress Notes (Signed)
  Subjective:  Patient ID: Katherine Herrera, female    DOB: [birth year],  MRN: [id number]  Chief Complaint  Patient presents with  . Wound Check    Right foot lateral aspect wound check. Pt states it has gotten worse, particularly in the past 3 days  . Nail Problem    Nail trim 1-5 bilateral    54 y.o. female presents for wound care. Hx confirmed with patient.  Objective:  Physical Exam: Wound Location: Right lateral foot Wound Measurement: 2x3 post-debridement Wound Base: Granular/Healthy Peri-wound: Calloused Exudate: Scant/small amount Serosanguinous exudate wound without warmth, erythema, signs of acute infection    Assessment:   1. Diabetic ulcer of right midfoot associated with diabetes mellitus due to underlying condition, with necrosis of muscle ([place])      Plan:  Patient was evaluated and treated and all questions answered.  Ulcer Right lateral foot -Dressing applied consisting of medihoney -Offload ulcer with surgical shoe -Wound cleansed and debrided -Surgical shoe dispensed.  Procedure: Excisional Debridement of Wound Rationale: Removal of non-viable soft tissue from the wound to promote healing.  Anesthesia: none Pre-Debridement Wound Measurements: overlying hyperkeratosis  Post-Debridement Wound Measurements: 2 cm x 3 cm x 0.2 cm  Type of Debridement: Sharp Excisional Tissue Removed: Non-viable soft tissue Depth of Debridement: subcutaneous tissue. Technique: Sharp excisional debridement to bleeding, viable wound base.  Dressing: Dry, sterile, compression dressing. Disposition: Patient tolerated procedure well. Patient to return in 1 week for follow-up.  Onychomycosis and DPN -Patient is diabetic with a qualifying condition for at risk foot care.  Procedure: Nail Debridement Rationale: Patient meets criteria for routine foot care due to DPN Type of Debridement: manual, sharp debridement. Instrumentation: Nail nipper, rotary burr. Number of Nails:  10     No follow-ups on file.  MDM

## 2020-01-10 ENCOUNTER — Ambulatory Visit (INDEPENDENT_AMBULATORY_CARE_PROVIDER_SITE_OTHER): Payer: Medicare Other | Admitting: Podiatry

## 2020-01-10 ENCOUNTER — Telehealth: Payer: Self-pay | Admitting: *Deleted

## 2020-01-10 ENCOUNTER — Inpatient Hospital Stay (HOSPITAL_COMMUNITY)
Admission: AD | Admit: 2020-01-10 | Discharge: 2020-01-15 | DRG: 638 | Disposition: A | Discharge status: Home / Self Care | Payer: Medicare Other | Source: Ambulatory Visit | Attending: Family Medicine | Admitting: Family Medicine

## 2020-01-10 ENCOUNTER — Other Ambulatory Visit: Payer: Self-pay

## 2020-01-10 VITALS — BP 114/71 | HR 73 | Temp 98.8°F

## 2020-01-10 DIAGNOSIS — Z794 Long term (current) use of insulin: Secondary | ICD-10-CM | POA: Diagnosis not present

## 2020-01-10 DIAGNOSIS — F17210 Nicotine dependence, cigarettes, uncomplicated: Secondary | ICD-10-CM | POA: Diagnosis present

## 2020-01-10 DIAGNOSIS — Z8619 Personal history of other infectious and parasitic diseases: Secondary | ICD-10-CM

## 2020-01-10 DIAGNOSIS — M545 Low back pain: Secondary | ICD-10-CM | POA: Diagnosis present

## 2020-01-10 DIAGNOSIS — E08621 Diabetes mellitus due to underlying condition with foot ulcer: Secondary | ICD-10-CM

## 2020-01-10 DIAGNOSIS — Z6829 Body mass index (BMI) 29.0-29.9, adult: Secondary | ICD-10-CM | POA: Diagnosis not present

## 2020-01-10 DIAGNOSIS — E663 Overweight: Secondary | ICD-10-CM | POA: Diagnosis present

## 2020-01-10 DIAGNOSIS — Z981 Arthrodesis status: Secondary | ICD-10-CM

## 2020-01-10 DIAGNOSIS — Z20822 Contact with and (suspected) exposure to covid-19: Secondary | ICD-10-CM | POA: Diagnosis present

## 2020-01-10 DIAGNOSIS — K5900 Constipation, unspecified: Secondary | ICD-10-CM | POA: Diagnosis present

## 2020-01-10 DIAGNOSIS — Z7982 Long term (current) use of aspirin: Secondary | ICD-10-CM

## 2020-01-10 DIAGNOSIS — E11621 Type 2 diabetes mellitus with foot ulcer: Secondary | ICD-10-CM | POA: Diagnosis not present

## 2020-01-10 DIAGNOSIS — E785 Hyperlipidemia, unspecified: Secondary | ICD-10-CM | POA: Diagnosis not present

## 2020-01-10 DIAGNOSIS — Z888 Allergy status to other drugs, medicaments and biological substances status: Secondary | ICD-10-CM | POA: Diagnosis not present

## 2020-01-10 DIAGNOSIS — F17200 Nicotine dependence, unspecified, uncomplicated: Secondary | ICD-10-CM | POA: Diagnosis present

## 2020-01-10 DIAGNOSIS — J449 Chronic obstructive pulmonary disease, unspecified: Secondary | ICD-10-CM | POA: Diagnosis present

## 2020-01-10 DIAGNOSIS — Z833 Family history of diabetes mellitus: Secondary | ICD-10-CM | POA: Diagnosis not present

## 2020-01-10 DIAGNOSIS — L02619 Cutaneous abscess of unspecified foot: Secondary | ICD-10-CM

## 2020-01-10 DIAGNOSIS — E1169 Type 2 diabetes mellitus with other specified complication: Secondary | ICD-10-CM | POA: Diagnosis not present

## 2020-01-10 DIAGNOSIS — G43909 Migraine, unspecified, not intractable, without status migrainosus: Secondary | ICD-10-CM | POA: Diagnosis present

## 2020-01-10 DIAGNOSIS — M779 Enthesopathy, unspecified: Secondary | ICD-10-CM | POA: Diagnosis present

## 2020-01-10 DIAGNOSIS — L03115 Cellulitis of right lower limb: Secondary | ICD-10-CM | POA: Diagnosis not present

## 2020-01-10 DIAGNOSIS — L97519 Non-pressure chronic ulcer of other part of right foot with unspecified severity: Secondary | ICD-10-CM | POA: Diagnosis present

## 2020-01-10 DIAGNOSIS — G8929 Other chronic pain: Secondary | ICD-10-CM | POA: Diagnosis present

## 2020-01-10 DIAGNOSIS — L03119 Cellulitis of unspecified part of limb: Secondary | ICD-10-CM | POA: Diagnosis not present

## 2020-01-10 DIAGNOSIS — Z8279 Family history of other congenital malformations, deformations and chromosomal abnormalities: Secondary | ICD-10-CM | POA: Diagnosis not present

## 2020-01-10 DIAGNOSIS — M7751 Other enthesopathy of right foot: Secondary | ICD-10-CM | POA: Diagnosis present

## 2020-01-10 DIAGNOSIS — F419 Anxiety disorder, unspecified: Secondary | ICD-10-CM | POA: Diagnosis present

## 2020-01-10 DIAGNOSIS — L97413 Non-pressure chronic ulcer of right heel and midfoot with necrosis of muscle: Secondary | ICD-10-CM | POA: Diagnosis not present

## 2020-01-10 DIAGNOSIS — E039 Hypothyroidism, unspecified: Secondary | ICD-10-CM | POA: Diagnosis present

## 2020-01-10 DIAGNOSIS — E559 Vitamin D deficiency, unspecified: Secondary | ICD-10-CM | POA: Diagnosis present

## 2020-01-10 DIAGNOSIS — Z8249 Family history of ischemic heart disease and other diseases of the circulatory system: Secondary | ICD-10-CM | POA: Diagnosis not present

## 2020-01-10 DIAGNOSIS — E875 Hyperkalemia: Secondary | ICD-10-CM | POA: Diagnosis not present

## 2020-01-10 DIAGNOSIS — R609 Edema, unspecified: Secondary | ICD-10-CM | POA: Diagnosis not present

## 2020-01-10 DIAGNOSIS — L0390 Cellulitis, unspecified: Secondary | ICD-10-CM | POA: Diagnosis not present

## 2020-01-10 DIAGNOSIS — L97511 Non-pressure chronic ulcer of other part of right foot limited to breakdown of skin: Secondary | ICD-10-CM

## 2020-01-10 DIAGNOSIS — Z79890 Hormone replacement therapy (postmenopausal): Secondary | ICD-10-CM

## 2020-01-10 DIAGNOSIS — Z882 Allergy status to sulfonamides status: Secondary | ICD-10-CM

## 2020-01-10 DIAGNOSIS — M109 Gout, unspecified: Secondary | ICD-10-CM | POA: Diagnosis present

## 2020-01-10 DIAGNOSIS — E1142 Type 2 diabetes mellitus with diabetic polyneuropathy: Secondary | ICD-10-CM | POA: Diagnosis not present

## 2020-01-10 DIAGNOSIS — Z79899 Other long term (current) drug therapy: Secondary | ICD-10-CM

## 2020-01-10 DIAGNOSIS — M7989 Other specified soft tissue disorders: Secondary | ICD-10-CM | POA: Diagnosis not present

## 2020-01-10 LAB — CBC WITH DIFFERENTIAL/PLATELET
Abs Immature Granulocytes: 0.03 10*3/uL (ref 0.00–0.07)
Basophils Absolute: 0.1 10*3/uL (ref 0.0–0.1)
Basophils Relative: 1 %
Eosinophils Absolute: 0.2 10*3/uL (ref 0.0–0.5)
Eosinophils Relative: 3 %
HCT: 41.2 % (ref 36.0–46.0)
Hemoglobin: 13 g/dL (ref 12.0–15.0)
Immature Granulocytes: 0 %
Lymphocytes Relative: 36 %
Lymphs Abs: 3 10*3/uL (ref 0.7–4.0)
MCH: 28 pg (ref 26.0–34.0)
MCHC: 31.6 g/dL (ref 30.0–36.0)
MCV: 88.6 fL (ref 80.0–100.0)
Monocytes Absolute: 0.4 10*3/uL (ref 0.1–1.0)
Monocytes Relative: 5 %
Neutro Abs: 4.6 10*3/uL (ref 1.7–7.7)
Neutrophils Relative %: 55 %
Platelets: 538 10*3/uL — ABNORMAL HIGH (ref 150–400)
RBC: 4.65 MIL/uL (ref 3.87–5.11)
RDW: 13.2 % (ref 11.5–15.5)
WBC: 8.4 10*3/uL (ref 4.0–10.5)
nRBC: 0 % (ref 0.0–0.2)

## 2020-01-10 LAB — GLUCOSE, CAPILLARY: Glucose-Capillary: 407 mg/dL — ABNORMAL HIGH (ref 70–99)

## 2020-01-10 MED ORDER — SODIUM CHLORIDE 0.9 % IV SOLN: INTRAVENOUS | Status: DC

## 2020-01-10 MED ORDER — VANCOMYCIN HCL 2000 MG/400ML IV SOLN
2000.0000 mg | Freq: Once | INTRAVENOUS | Status: AC
  Administered 2020-01-11: 2000 mg via INTRAVENOUS
  Filled 2020-01-10: qty 400

## 2020-01-10 MED ORDER — ENOXAPARIN SODIUM 40 MG/0.4ML ~~LOC~~ SOLN
40.0000 mg | Freq: Every day | SUBCUTANEOUS | Status: DC
  Administered 2020-01-11 – 2020-01-14 (×5): 40 mg via SUBCUTANEOUS
  Filled 2020-01-10 (×4): qty 0.4

## 2020-01-10 MED ORDER — ACETAMINOPHEN 650 MG RE SUPP: 650.0000 mg | Freq: Four times a day (QID) | RECTAL | Status: DC | PRN

## 2020-01-10 MED ORDER — INSULIN ASPART 100 UNIT/ML ~~LOC~~ SOLN
0.0000 [IU] | Freq: Three times a day (TID) | SUBCUTANEOUS | Status: DC
  Administered 2020-01-11: 7 [IU] via SUBCUTANEOUS
  Administered 2020-01-11: 4 [IU] via SUBCUTANEOUS

## 2020-01-10 MED ORDER — INSULIN ASPART 100 UNIT/ML ~~LOC~~ SOLN: 0.0000 [IU] | Freq: Every day | SUBCUTANEOUS | Status: DC

## 2020-01-10 MED ORDER — NICOTINE 21 MG/24HR TD PT24
21.0000 mg | MEDICATED_PATCH | Freq: Every day | TRANSDERMAL | Status: DC
  Filled 2020-01-10 (×3): qty 1

## 2020-01-10 MED ORDER — SODIUM CHLORIDE 0.9 % IV SOLN
2.0000 g | Freq: Three times a day (TID) | INTRAVENOUS | Status: DC
  Administered 2020-01-11 – 2020-01-15 (×15): 2 g via INTRAVENOUS
  Filled 2020-01-10 (×18): qty 2

## 2020-01-10 MED ORDER — ONDANSETRON HCL 4 MG/2ML IJ SOLN: 4.0000 mg | Freq: Four times a day (QID) | INTRAMUSCULAR | Status: DC | PRN

## 2020-01-10 MED ORDER — INSULIN ASPART 100 UNIT/ML ~~LOC~~ SOLN
10.0000 [IU] | Freq: Once | SUBCUTANEOUS | Status: AC
  Administered 2020-01-11: 10 [IU] via SUBCUTANEOUS

## 2020-01-10 MED ORDER — ACETAMINOPHEN 325 MG PO TABS
650.0000 mg | ORAL_TABLET | Freq: Four times a day (QID) | ORAL | Status: DC | PRN
  Filled 2020-01-10 (×2): qty 2

## 2020-01-10 MED ORDER — MORPHINE SULFATE (PF) 2 MG/ML IV SOLN
2.0000 mg | INTRAVENOUS | Status: DC | PRN
  Administered 2020-01-11: 2 mg via INTRAVENOUS
  Filled 2020-01-10: qty 1

## 2020-01-10 MED ORDER — ONDANSETRON HCL 4 MG PO TABS: 4.0000 mg | ORAL_TABLET | Freq: Four times a day (QID) | ORAL | Status: DC | PRN

## 2020-01-10 NOTE — Telephone Encounter (Signed)
Katherine Herrera - Patient Placement called states pt is to be in a Telemetry Bed and will call with the location and the 3 west at [facility] was changed.

## 2020-01-10 NOTE — Progress Notes (Signed)
Dr. Humphrey Rolls notified of pt's CBG 407, orders received

## 2020-01-10 NOTE — Telephone Encounter (Signed)
Direct Admit to [place] at the [employer] transferred to [facility] - Patient Placement for Bed at [facility], hold pt until 4:40pm then send to [facility]. Dr. March Rummage called to speak with Hospitalist.

## 2020-01-10 NOTE — H&P (Signed)
History and Physical   Katherine Herrera DOB: August 10, 1966 DOA: 01/10/2020  Referring MD/NP/PA: Dr. March Rummage  PCP: Alain Marion [name], MD   Outpatient Specialists: Dr. March Rummage, podiatrist  Patient coming from: Home  Chief Complaint: Right lower extremity swelling and pain  HPI: Katherine Herrera is a 54 y.o. female with medical history significant of right foot ulcer with cellulitis followed by Dr. March Rummage and is podiatry's clinic, diabetes, COPD, anxiety disorder, morbid obesity, gout, Charcot's joint, hypothyroidism and hyperlipidemia who was sent in as a direct admission by Dr. March Rummage due to worsening cellulitis and infection of her diabetic foot ulcer.  Patient was doing much better was being treated for her diabetic foot ulcer on the lateral aspect of the right foot.  She recently traveled to [place] with her husband when she had a flareup and severe sepsis from the cellulitis and wound infection.  During that hospitalization she required Levophed due to hypotension.  Patient was discharged from the hospital and spent 2 days in [place] before returning home.  Today she went to see her podiatrist who reviewed her case and referred her to the hospital for admission and IV antibiotics.  He will follow up with her in the hospital.  Patient complains of 6 out of 10 pain at the moment.  She has had some fever and chills on and off but not today.  She has been unable to put weight on her foot.  Denied any injury..   Review of Systems: As per HPI otherwise 10 point review of systems negative.    Past Medical History:  Diagnosis Date  . Anxiety   . COPD (chronic obstructive pulmonary disease) ([place])   . DM (diabetes mellitus) ([place])    type 2  . Family history of adverse reaction to anesthesia    mother hard to wake up  . Gout    possible  . Hyperlipidemia   . Hypothyroidism   . LBP (low back pain)    DR Patrice Paradise  . Migraine   . Peripheral neuropathy   . Syncope    poss conversion  disorder vs psychogenic seizures  . Tachycardia    diet coke overuse?  Marland Kitchen Tenosynovitis of foot and ankle 09/03/2013  . TTS (tarsal tunnel syndrome) 2010   Left Foot [facility] in [place]  . Vitamin D deficiency   . Wound, open    both feet outer parts no drainage, changes dressing 1 day, wounds dime size for last month    Past Surgical History:  Procedure Laterality Date  . ABDOMINAL HYSTERECTOMY    . APPLICATION OF WOUND VAC Right 07/05/2019   Procedure: Application Of Wound Vac;  Surgeon: Evelina Bucy, DPM;  Location: [place];  Service: Podiatry;  Laterality: Right;  . CARPAL TUNNEL RELEASE  2010   LEFT  . CERVICAL FUSION     C4-5  . INCISION AND DRAINAGE OF WOUND Right 07/20/2019   Procedure: IRRIGATION AND DEBRIDEMENT foot;  Surgeon: Evelina Bucy, DPM;  Location: [facility];  Service: Podiatry;  Laterality: Right;  . INCISION AND DRAINAGE OF WOUND Right 07/22/2019   Procedure: IRRIGATION AND DEBRIDEMENT RIGHT FOOT; APPLICATION OF WOUND VAC;  Surgeon: Evelina Bucy, DPM;  Location: [facility];  Service: Podiatry;  Laterality: Right;  . INCISION AND DRAINAGE OF WOUND Right 07/24/2019   Procedure: IRRIGATION AND DEBRIDEMENT WOUND, APPLICATION OF WOUND VAC;  Surgeon: Evelina Bucy, DPM;  Location: [facility];  Service: Podiatry;  Laterality: Right;  . METATARSAL HEAD  EXCISION Bilateral 01/02/2019   Procedure: Left foot 5th metatarsal resection, wound excision, adjacent tissue transfer. Right fifth metatarsal head resection, fitfth metatarsal base resection, intermediate wound repair.;  Surgeon: Evelina Bucy, DPM;  Location: [place];  Service: Podiatry;  Laterality: Bilateral;  . METATARSAL OSTEOTOMY Right 07/05/2019   Procedure: RIGHT FOOT 5TH METATARSAL RESECTION; PERONEAL TENDONESIS, EXCISION OF ULCER, LOCAL TISSUE TRANSFER ;  Surgeon: Evelina Bucy, DPM;  Location: [place];  Service: Podiatry;  Laterality: Right;     reports that she has been smoking. She has been smoking  about 1.00 pack per day. She has never used smokeless tobacco. She reports that she does not drink alcohol or use drugs.  Allergies  Allergen Reactions  . Hydromorphone Hcl Itching    Patient has been tolerating Hydromorphone tablets without adverse effect (07/09/19)  . Cephalexin Nausea And Vomiting  . Demerol  [Meperidine Hcl] Rash  . Lovastatin Other (See Comments)    Other reaction(s): Unknown (See Comments) Possible myalgia Unknown   . Metformin And Related Nausea And Vomiting  . Sulfamethoxazole-Trimethoprim     Other reaction(s): Unknown DILI, pancreatitis  . Crestor [Rosuvastatin Calcium]     myalgia  . Niacin Other (See Comments)    Unknown   . Paroxetine Other (See Comments)    Other reaction(s): Unknown (See Comments) unknown Unknown     Family History  Problem Relation Age of Onset  . Diabetes Mother   . Hypertension Mother   . Cancer Father 15       ? bone cancer  . [name] Brother      Prior to Admission medications   Medication Sig Start Date End Date Taking? Authorizing Provider  aspirin (BAYER ASPIRIN) 325 MG tablet Take 1 tablet (325 mg total) by mouth daily. 06/16/15   Plotnikov, [name], MD  B-D INS SYR ULTRAFINE 1CC/31G 31G X 5/16" 1 ML MISC USE AS DIRECTED 09/08/16   Renato Shin, MD  baclofen (LIORESAL) 10 MG tablet Take 2 tablets (20 mg total) by mouth 2 (two) times daily. 11/08/19   Plotnikov, [name], MD  Blood Glucose Monitoring Suppl (ONETOUCH VERIO) w/Device KIT  11/28/18   [provider]  cefdinir (OMNICEF) 300 MG capsule Take by mouth. 01/04/20 01/11/20  [provider]  cholecalciferol (VITAMIN D) 25 MCG (1000 UT) tablet Take 2 tablets (2,000 Units total) by mouth daily. 12/22/18   Plotnikov, [name], MD  ciprofloxacin (CIPRO) 500 MG tablet Take 1 tablet (500 mg total) by mouth 2 (two) times daily. 12/31/19   Evelina Bucy, DPM  clindamycin (CLEOCIN) 300 MG capsule Take 1 capsule (300 mg total) by mouth 2 (two) times  daily. 12/31/19   Evelina Bucy, DPM  clonazePAM (KLONOPIN) 0.5 MG tablet Take 1 tablet (0.5 mg total) by mouth 2 (two) times daily as needed for anxiety. 12/04/19   Plotnikov, [name], MD  ezetimibe (ZETIA) 10 MG tablet Take 1 tablet (10 mg total) by mouth daily. 03/28/19   Plotnikov, [name], MD  gabapentin (NEURONTIN) 800 MG tablet TAKE 1 TABLET BY MOUTH 4  TIMES DAILY 08/16/19   Plotnikov, [name], MD  glucose blood (ONETOUCH VERIO) test strip Use to check blood sugar 2 times per day. 06/24/16   Renato Shin, MD  insulin NPH-regular Human (70-30) 100 UNIT/ML injection Inject 10 Units into the skin 2 (two) times daily with a meal. 07/25/19   Dessa Phi, DO  Insulin Syringe-Needle U-100 (SURE COMFORT INSULIN SYRINGE)  31G X 5/16" 0.5 ML MISC USE AS DIRECTED WITH INSULIN 4 TIMES A DAY 09/08/17   Renato Shin, MD  lamoTRIgine (LAMICTAL) 25 MG tablet TAKE 1 TABLET BY MOUTH TWO  TIMES DAILY 08/26/19   Plotnikov, [name], MD  Lancet Devices (ACCU-CHEK [facility]) lancets Test two times daily 07/01/16   Renato Shin, MD  Lancets (ONETOUCH DELICA PLUS PPIRJJ88C) [place]  11/07/19   [provider]  levothyroxine (SYNTHROID) 75 MCG tablet TAKE 1 TABLET BY MOUTH  DAILY 08/16/19   Plotnikov, [name], MD  lisinopril (ZESTRIL) 10 MG tablet Take 10 mg by mouth daily. 09/14/19   [provider]  loratadine (CLARITIN) 10 MG tablet Take 10 mg by mouth daily as needed for allergies.    [provider]  mupirocin ointment (BACTROBAN) 2 % Apply topically. 01/04/20   [provider]  oxyCODONE (ROXICODONE) 15 MG immediate release tablet Take 1 tablet (15 mg total) by mouth every 6 (six) hours as needed. 12/20/19   Plotnikov, [name], MD  oxyCODONE (ROXICODONE) 15 MG immediate release tablet Take 1 tablet (15 mg total) by mouth every 6 (six) hours as needed for pain. 12/20/19   Plotnikov, [name], MD  oxyCODONE (ROXICODONE) 15 MG immediate release tablet TAKE 1 TABLET (15 MG TOTAL)  BY MOUTH EVERY 6 (SIX) HOURS AS NEEDED 12/20/19   Plotnikov, [name], MD  polyethylene glycol (MIRALAX / GLYCOLAX) 17 g packet Take 17 g by mouth daily as needed for moderate constipation. 07/10/19   Aline August, MD  rOPINIRole (REQUIP) 0.5 MG tablet TAKE 1 TABLET BY MOUTH AT  BEDTIME 08/16/19   Plotnikov, [name], MD  senna-docusate (SENOKOT-S) 8.6-50 MG tablet Take 1 tablet by mouth 2 (two) times daily. Patient taking differently: Take 1 tablet by mouth at bedtime.  07/10/19   Aline August, MD  vitamin B-12 (CYANOCOBALAMIN) 1000 MCG tablet Take 1,000 mcg by mouth daily.      [provider]    Physical Exam: Vitals:   01/10/20 2212 01/10/20 2213  BP:  (!) 144/83  Pulse:  90  Resp:  17  Temp:  98.2 F (36.8 C)  TempSrc:  Oral  SpO2:  98%  Weight: 93.3 kg   Height: 5' 9.5" (1.765 m)       Constitutional: NAD, anxious, morbidly obese Vitals:   01/10/20 2212 01/10/20 2213  BP:  (!) 144/83  Pulse:  90  Resp:  17  Temp:  98.2 F (36.8 C)  TempSrc:  Oral  SpO2:  98%  Weight: 93.3 kg   Height: 5' 9.5" (1.765 m)    Eyes: PERRL, lids and conjunctivae normal ENMT: Mucous membranes are moist. Posterior pharynx clear of any exudate or lesions.Normal dentition.  Neck: normal, supple, no masses, no thyromegaly Respiratory: clear to auscultation bilaterally, no wheezing, no crackles. Normal respiratory effort. No accessory muscle use.  Cardiovascular: Regular rate and rhythm, no murmurs / rubs / gallops. No extremity edema. 2+ pedal pulses. No carotid bruits.  Abdomen: no tenderness, no masses palpated. No hepatosplenomegaly. Bowel sounds positive.  Musculoskeletal: no clubbing / cyanosis.  Deformed right foot with Charcot's joints, swollen right lower extremity with ulcer on the lateral aspect of the right foot dry, crusted not draining good ROM, no contractures. Normal muscle tone.  Skin: Cellulitis and redness of the right lower extremity with warmth all the way to below  the knee. Neurologic: CN 2-12 grossly intact. Sensation intact, DTR normal. Strength 5/5 in all 4.  Psychiatric: Normal  judgment and insight. Alert and oriented x 3. Normal mood.     Labs on Admission: I have personally reviewed following labs and imaging studies  CBC: Recent Labs  Lab 01/10/20 2319  WBC 8.4  NEUTROABS 4.6  HGB 13.0  HCT 41.2  MCV 88.6  PLT 161*   Basic Metabolic Panel: No results for input(s): NA, K, CL, CO2, GLUCOSE, BUN, CREATININE, CALCIUM, MG, PHOS in the last 168 hours. GFR: CrCl cannot be calculated (Patient's most recent lab result is older than the maximum 21 days allowed.). Liver Function Tests: No results for input(s): AST, ALT, ALKPHOS, BILITOT, PROT, ALBUMIN in the last 168 hours. No results for input(s): LIPASE, AMYLASE in the last 168 hours. No results for input(s): AMMONIA in the last 168 hours. Coagulation Profile: No results for input(s): INR, PROTIME in the last 168 hours. Cardiac Enzymes: No results for input(s): CKTOTAL, CKMB, CKMBINDEX, TROPONINI in the last 168 hours. BNP (last 3 results) No results for input(s): PROBNP in the last 8760 hours. HbA1C: No results for input(s): HGBA1C in the last 72 hours. CBG: Recent Labs  Lab 01/10/20 2333  GLUCAP 407*   Lipid Profile: No results for input(s): CHOL, HDL, LDLCALC, TRIG, CHOLHDL, LDLDIRECT in the last 72 hours. Thyroid Function Tests: No results for input(s): TSH, T4TOTAL, FREET4, T3FREE, THYROIDAB in the last 72 hours. Anemia Panel: No results for input(s): VITAMINB12, FOLATE, FERRITIN, TIBC, IRON, RETICCTPCT in the last 72 hours. Urine analysis:    Component Value Date/Time   COLORURINE YELLOW 02/20/2019 1330   APPEARANCEUR CLEAR 02/20/2019 1330   LABSPEC 1.010 02/20/2019 1330   PHURINE 5.5 02/20/2019 1330   GLUCOSEU NEGATIVE 02/20/2019 1330   HGBUR NEGATIVE 02/20/2019 1330   BILIRUBINUR NEGATIVE 02/20/2019 1330   KETONESUR NEGATIVE 02/20/2019 1330   PROTEINUR NEGATIVE  09/28/2018 2318   UROBILINOGEN 0.2 02/20/2019 1330   NITRITE NEGATIVE 02/20/2019 1330   LEUKOCYTESUR NEGATIVE 02/20/2019 1330   Sepsis Labs: '@LABRCNTIP' (procalcitonin:4,lacticidven:4) )No results found for this or any previous visit (from the past 240 hour(s)).   Radiological Exams on Admission: No results found.   Assessment/Plan Principal Problem:   Cellulitis of right lower leg Active Problems:   Hypothyroidism   Dyslipidemia   TOBACCO USE DISORDER/SMOKER-SMOKING CESSATION DISCUSSED   Polyneuropathy due to type 2 diabetes mellitus (HCC)   Capsulitis of metatarsophalangeal (MTP) joint of right foot     #1 right lower extremity cellulitis: Suspected wound infection.  Right leg slightly swollen tender all the way to below the knee.  Also site of healing ulcer on the lateral aspect of the right foot.  Will admit patient initiate vancomycin and cefepime.  She had MRI apparently in [place].  We will get podiatry consult for any repeat imaging if needed.  Wound care per podiatry recommendation.  #2 diabetes: Initiate home regimen and continue with insulin sliding scale.  #3 tobacco abuse: Nicotine patch and counseling provided  #4 hypothyroidism: Continue levothyroxine.  #5 polyneuropathy: Continue close monitoring.  Continue gabapentin and other home regimen.  #6 morbid obesity: Dietary counseling.  #7 history of Charcot's joints: Again continue per podiatry   DVT prophylaxis: Lovenox Code Status: Full code Family Communication: Discussed care with patient and husband over the phone Disposition Plan: Home Consults called: Dr. March Rummage, podiatry Admission status: Inpatient  Severity of Illness: The appropriate patient status for this patient is INPATIENT. Inpatient status is judged to be reasonable and necessary in order to provide the required intensity of service to ensure the patient's safety. The  patient's presenting symptoms, physical exam findings, and initial  radiographic and laboratory data in the context of their chronic comorbidities is felt to place them at high risk for further clinical deterioration. Furthermore, it is not anticipated that the patient will be medically stable for discharge from the hospital within 2 midnights of admission. The following factors support the patient status of inpatient.   " The patient's presenting symptoms include right lower extremity swelling and redness. " The worrisome physical exam findings include cellulitis with swelling of the right leg. " The initial radiographic and laboratory data are worrisome because of labs currently pending. " The chronic co-morbidities include diabetes with foot ulcer.   * I certify that at the point of admission it is my clinical judgment that the patient will require inpatient hospital care spanning beyond 2 midnights from the point of admission due to high intensity of service, high risk for further deterioration and high frequency of surveillance required.Barbette Merino MD [facility] Pager [phone]  If 7PM-7AM, please contact night-coverage [email or web address] Password [facility]  01/11/2020, 12:10 AM

## 2020-01-11 ENCOUNTER — Encounter (HOSPITAL_COMMUNITY): Payer: Self-pay | Admitting: Internal Medicine

## 2020-01-11 DIAGNOSIS — E1142 Type 2 diabetes mellitus with diabetic polyneuropathy: Secondary | ICD-10-CM

## 2020-01-11 DIAGNOSIS — E785 Hyperlipidemia, unspecified: Secondary | ICD-10-CM

## 2020-01-11 DIAGNOSIS — F17200 Nicotine dependence, unspecified, uncomplicated: Secondary | ICD-10-CM

## 2020-01-11 DIAGNOSIS — E039 Hypothyroidism, unspecified: Secondary | ICD-10-CM

## 2020-01-11 LAB — COMPREHENSIVE METABOLIC PANEL
ALT: 14 U/L (ref 0–44)
AST: 18 U/L (ref 15–41)
Albumin: 2.7 g/dL — ABNORMAL LOW (ref 3.5–5.0)
Alkaline Phosphatase: 88 U/L (ref 38–126)
Anion gap: 9 (ref 5–15)
BUN: 20 mg/dL (ref 6–20)
CO2: 28 mmol/L (ref 22–32)
Calcium: 8.9 mg/dL (ref 8.9–10.3)
Chloride: 99 mmol/L (ref 98–111)
Creatinine, Ser: 1.09 mg/dL — ABNORMAL HIGH (ref 0.44–1.00)
GFR calc Af Amer: >60 mL/min (ref >60)
GFR calc non Af Amer: 58 mL/min — ABNORMAL LOW (ref >60)
Glucose, Bld: 136 mg/dL — ABNORMAL HIGH (ref 70–99)
Potassium: 4.7 mmol/L (ref 3.5–5.1)
Sodium: 136 mmol/L (ref 135–145)
Total Bilirubin: 1 mg/dL (ref 0.3–1.2)
Total Protein: 7.4 g/dL (ref 6.5–8.1)

## 2020-01-11 LAB — CBC
HCT: 39.2 % (ref 36.0–46.0)
Hemoglobin: 12.2 g/dL (ref 12.0–15.0)
MCH: 27.6 pg (ref 26.0–34.0)
MCHC: 31.1 g/dL (ref 30.0–36.0)
MCV: 88.7 fL (ref 80.0–100.0)
Platelets: 507 10*3/uL — ABNORMAL HIGH (ref 150–400)
RBC: 4.42 MIL/uL (ref 3.87–5.11)
RDW: 13.1 % (ref 11.5–15.5)
WBC: 9.5 10*3/uL (ref 4.0–10.5)
nRBC: 0 % (ref 0.0–0.2)

## 2020-01-11 LAB — GLUCOSE, CAPILLARY
Glucose-Capillary: 191 mg/dL — ABNORMAL HIGH (ref 70–99)
Glucose-Capillary: 200 mg/dL — ABNORMAL HIGH (ref 70–99)
Glucose-Capillary: 205 mg/dL — ABNORMAL HIGH (ref 70–99)
Glucose-Capillary: 236 mg/dL — ABNORMAL HIGH (ref 70–99)
Glucose-Capillary: 274 mg/dL — ABNORMAL HIGH (ref 70–99)
Glucose-Capillary: 434 mg/dL — ABNORMAL HIGH (ref 70–99)

## 2020-01-11 LAB — BASIC METABOLIC PANEL
Anion gap: 8 (ref 5–15)
BUN: 19 mg/dL (ref 6–20)
CO2: 31 mmol/L (ref 22–32)
Calcium: 9 mg/dL (ref 8.9–10.3)
Chloride: 92 mmol/L — ABNORMAL LOW (ref 98–111)
Creatinine, Ser: 1.18 mg/dL — ABNORMAL HIGH (ref 0.44–1.00)
GFR calc Af Amer: >60 mL/min (ref >60)
GFR calc non Af Amer: 52 mL/min — ABNORMAL LOW (ref >60)
Glucose, Bld: 503 mg/dL (ref 70–99)
Potassium: 5 mmol/L (ref 3.5–5.1)
Sodium: 131 mmol/L — ABNORMAL LOW (ref 135–145)

## 2020-01-11 LAB — HEMOGLOBIN A1C
Hgb A1c MFr Bld: 9.8 % — ABNORMAL HIGH (ref 4.8–5.6)
Mean Plasma Glucose: 234.56 mg/dL

## 2020-01-11 LAB — GLUCOSE, RANDOM: Glucose, Bld: 464 mg/dL — ABNORMAL HIGH (ref 70–99)

## 2020-01-11 MED ORDER — VANCOMYCIN HCL 1750 MG/350ML IV SOLN
1750.0000 mg | INTRAVENOUS | Status: DC
  Administered 2020-01-11 – 2020-01-14 (×4): 1750 mg via INTRAVENOUS
  Filled 2020-01-11 (×5): qty 350

## 2020-01-11 MED ORDER — MORPHINE SULFATE (PF) 2 MG/ML IV SOLN
2.0000 mg | INTRAVENOUS | Status: DC | PRN
  Administered 2020-01-12 – 2020-01-15 (×7): 2 mg via INTRAVENOUS
  Filled 2020-01-11 (×7): qty 1

## 2020-01-11 MED ORDER — MORPHINE SULFATE (PF) 2 MG/ML IV SOLN: 2.0000 mg | INTRAVENOUS | Status: DC | PRN

## 2020-01-11 MED ORDER — INSULIN ASPART 100 UNIT/ML ~~LOC~~ SOLN
0.0000 [IU] | Freq: Every day | SUBCUTANEOUS | Status: DC
  Administered 2020-01-11: 3 [IU] via SUBCUTANEOUS

## 2020-01-11 MED ORDER — INSULIN ASPART 100 UNIT/ML ~~LOC~~ SOLN
0.0000 [IU] | Freq: Three times a day (TID) | SUBCUTANEOUS | Status: DC
  Administered 2020-01-11 – 2020-01-12 (×3): 5 [IU] via SUBCUTANEOUS

## 2020-01-11 MED ORDER — VITAMIN B-12 1000 MCG PO TABS
1000.0000 ug | ORAL_TABLET | Freq: Every day | ORAL | Status: DC
  Administered 2020-01-12 – 2020-01-15 (×4): 1000 ug via ORAL
  Filled 2020-01-11 (×4): qty 1

## 2020-01-11 MED ORDER — OXYCODONE HCL 5 MG PO TABS: 15.0000 mg | ORAL_TABLET | Freq: Four times a day (QID) | ORAL | Status: DC | PRN

## 2020-01-11 MED ORDER — BACLOFEN 20 MG PO TABS
20.0000 mg | ORAL_TABLET | Freq: Two times a day (BID) | ORAL | Status: DC
  Administered 2020-01-11 – 2020-01-15 (×8): 20 mg via ORAL
  Filled 2020-01-11 (×9): qty 1

## 2020-01-11 MED ORDER — BISACODYL 5 MG PO TBEC
10.0000 mg | DELAYED_RELEASE_TABLET | Freq: Every day | ORAL | Status: DC
  Administered 2020-01-11 – 2020-01-14 (×4): 10 mg via ORAL
  Filled 2020-01-11 (×4): qty 2

## 2020-01-11 MED ORDER — VITAMIN D3 25 MCG (1000 UNIT) PO TABS
1000.0000 [IU] | ORAL_TABLET | Freq: Every day | ORAL | Status: DC
  Administered 2020-01-11 – 2020-01-15 (×5): 1000 [IU] via ORAL
  Filled 2020-01-11 (×5): qty 1

## 2020-01-11 MED ORDER — EZETIMIBE 10 MG PO TABS
10.0000 mg | ORAL_TABLET | Freq: Every day | ORAL | Status: DC
  Administered 2020-01-11 – 2020-01-15 (×5): 10 mg via ORAL
  Filled 2020-01-11 (×5): qty 1

## 2020-01-11 MED ORDER — INSULIN ASPART PROT & ASPART (70-30 MIX) 100 UNIT/ML ~~LOC~~ SUSP
30.0000 [IU] | Freq: Two times a day (BID) | SUBCUTANEOUS | Status: DC
  Administered 2020-01-11: 30 [IU] via SUBCUTANEOUS
  Filled 2020-01-11: qty 10

## 2020-01-11 MED ORDER — SENNOSIDES-DOCUSATE SODIUM 8.6-50 MG PO TABS
1.0000 | ORAL_TABLET | Freq: Every day | ORAL | Status: DC
  Administered 2020-01-11 – 2020-01-14 (×4): 1 via ORAL
  Filled 2020-01-11 (×4): qty 1

## 2020-01-11 MED ORDER — LEVOTHYROXINE SODIUM 50 MCG PO TABS
75.0000 ug | ORAL_TABLET | Freq: Every day | ORAL | Status: DC
  Administered 2020-01-12 – 2020-01-15 (×4): 75 ug via ORAL
  Filled 2020-01-11 (×4): qty 1

## 2020-01-11 MED ORDER — GABAPENTIN 400 MG PO CAPS
800.0000 mg | ORAL_CAPSULE | Freq: Four times a day (QID) | ORAL | Status: DC
  Administered 2020-01-11 – 2020-01-15 (×17): 800 mg via ORAL
  Filled 2020-01-11 (×17): qty 2

## 2020-01-11 MED ORDER — ASPIRIN 325 MG PO TABS
325.0000 mg | ORAL_TABLET | Freq: Every day | ORAL | Status: DC
  Administered 2020-01-11 – 2020-01-15 (×5): 325 mg via ORAL
  Filled 2020-01-11 (×5): qty 1

## 2020-01-11 MED ORDER — OXYCODONE HCL 5 MG PO TABS
15.0000 mg | ORAL_TABLET | Freq: Once | ORAL | Status: AC
  Administered 2020-01-11: 15 mg via ORAL
  Filled 2020-01-11: qty 3

## 2020-01-11 MED ORDER — ROPINIROLE HCL 1 MG PO TABS
0.5000 mg | ORAL_TABLET | Freq: Every day | ORAL | Status: DC
  Administered 2020-01-11 – 2020-01-14 (×4): 0.5 mg via ORAL
  Filled 2020-01-11 (×4): qty 1

## 2020-01-11 MED ORDER — INSULIN ASPART 100 UNIT/ML ~~LOC~~ SOLN
10.0000 [IU] | Freq: Once | SUBCUTANEOUS | Status: AC
  Administered 2020-01-11: 10 [IU] via INTRAVENOUS

## 2020-01-11 MED ORDER — OXYCODONE HCL 5 MG PO TABS
15.0000 mg | ORAL_TABLET | ORAL | Status: DC | PRN
  Administered 2020-01-11 (×2): 15 mg via ORAL
  Filled 2020-01-11 (×3): qty 3

## 2020-01-11 MED ORDER — LAMOTRIGINE 25 MG PO TABS
25.0000 mg | ORAL_TABLET | Freq: Two times a day (BID) | ORAL | Status: DC
  Administered 2020-01-11 – 2020-01-15 (×8): 25 mg via ORAL
  Filled 2020-01-11 (×9): qty 1

## 2020-01-11 NOTE — Progress Notes (Signed)
  Subjective:  Patient ID: Katherine Herrera, female    DOB: 11-27-1965,  MRN: [id number]  Patient seen bedside. Still having issues with pain. Thinks the redness in the leg is improving  Objective:   Vitals:   01/11/20 0937 01/11/20 1059  BP: 108/60 (!) 100/59  Pulse: 70 72  Resp: 18 16  Temp: 98.2 F (36.8 C) 98 F (36.7 C)  SpO2: 98% 94%   General AA&O x3. Normal mood and affect.  Vascular Dorsalis pedis and posterior tibial pulses 2/4 bilat. Brisk capillary refill to all digits. Pedal hair present.  Neurologic Epicritic sensation grossly intact.  Dermatologic Right leg cellulitis, blistering noted. Cellulitis appears to be receding. Plantar foot wound without signs of active infection.  Orthopedic: MMT 5/5 in dorsiflexion, plantarflexion, inversion, and eversion. Normal joint ROM without pain or crepitus.    Assessment & Plan:  Patient was evaluated and treated and all questions answered.  Cellulitis R Leg -Continue abx -Adjusted pain medications for patient. Add Oxycodone 15 mg q6 -Wound dressing changed. -Will continue to follow  Evelina Bucy, DPM  Accessible via secure chat for questions or concerns.

## 2020-01-11 NOTE — Progress Notes (Signed)
Pharmacy Antibiotic Note  Katherine Herrera is a 54 y.o. female admitted on 01/10/2020 with wound infection.  Pharmacy has been consulted for Vancomycin, cefepime dosing.  Plan: Vancomycin 2gm iv x1, then Vancomycin 1750 mg IV Q 24 hrs. Goal AUC 400-550. Expected AUC: 488 SCr used: 1.18  Cefepime 2gm iv x1, then 2gm iv q8hr   Height: 5' 9.5" (176.5 cm) Weight: 205 lb 11 oz (93.3 kg) IBW/kg (Calculated) : 67.35  Temp (24hrs), Avg:98.5 F (36.9 C), Min:98.2 F (36.8 C), Max:98.8 F (37.1 C)  Recent Labs  Lab 01/10/20 2319  WBC 8.4  CREATININE 1.18*    Estimated Creatinine Clearance: 66.9 mL/min (A) (by C-G formula based on SCr of 1.18 mg/dL (H)).    Allergies  Allergen Reactions  . Hydromorphone Hcl Itching    Patient has been tolerating Hydromorphone tablets without adverse effect (07/09/19)  . Cephalexin Nausea And Vomiting  . Demerol  [Meperidine Hcl] Rash  . Lovastatin Other (See Comments)    Other reaction(s): Unknown (See Comments) Possible myalgia Unknown   . Metformin And Related Nausea And Vomiting  . Sulfamethoxazole-Trimethoprim     Other reaction(s): Unknown DILI, pancreatitis  . Crestor [Rosuvastatin Calcium]     myalgia  . Niacin Other (See Comments)    Unknown   . Paroxetine Other (See Comments)    Other reaction(s): Unknown (See Comments) unknown Unknown     Antimicrobials this admission: Vancomycin 01/11/2020 >> Cefepime 01/11/2020 >>   Dose adjustments this admission: -  Microbiology results: -  Thank you for allowing pharmacy to be a part of this patient's care.  Nani Skillern Crowford 01/11/2020 4:35 AM

## 2020-01-11 NOTE — Progress Notes (Signed)
PROGRESS NOTE   Katherine Herrera  [id number]    DOB: 24-May-1966    DOA: 01/10/2020  PCP: Cassandria Anger, MD   I have briefly reviewed patients previous medical records in [facility].  Chief Complaint:  Right lower extremity redness, pain and swelling.  Brief Narrative:  54 year old female, PMH of type II DM/IDDM with peripheral neuropathy, anxiety, COPD, hyperlipidemia, hypothyroid, chronic pain, chronic right foot ulcer followed by Dr. March Rummage, podiatry, recently hospitalized in [place] for 5 days due to septic shock from severe sepsis, cellulitis and wound infection, reportedly required Levophed in the hospital, discharged approximately 5 days prior to this admission, return home and followed up with her podiatrist on day of admission who was concerned regarding ongoing cellulitis of her right leg and was referred to [facility] for admission for IV antibiotics.   Assessment & Plan:  Principal Problem:   Cellulitis of right lower leg Active Problems:   Hypothyroidism   Dyslipidemia   TOBACCO USE DISORDER/SMOKER-SMOKING CESSATION DISCUSSED   Polyneuropathy due to type 2 diabetes mellitus (HCC)   Capsulitis of metatarsophalangeal (MTP) joint of right foot   Right leg cellulitis complicating chronic right foot wound, diabetes with peripheral neuropathy: Recent hospitalization at OSH for septic shock related to same.  Hemodynamically stable.  Reportedly had MRI in [place], will defer repeat imaging to Dr. March Rummage.  Continue empirically started IV Cefepime and Vancomycin.  Monitor clinically.  Type II DM with peripheral neuropathy: A1c 9.8 on 3/12 suggesting poor outpatient control.  Patient reports that her CBGs at home usually range in the 220-280 mg per DL range but of late for the last couple of days they have been in the 300-400 range despite compliance with her insulins and medications.  She presented with blood glucose of 503.  Interestingly she had only been  started on SSI on admission.  Reviewed her home medications and resumed 70/30 at slightly reduced dose of 30 units twice daily, changed SSI from resistant to moderate sensitivity.  Monitor closely and adjust insulins as needed.  Continue prior home gabapentin and Lamictal for neuropathic pain.  Hypothyroidism: Continue Synthroid.  Hyperlipidemia: Continue Zetia.  Tobacco abuse: Cessation counseled.  Anxiety disorder: Stable.  Does not appear to have used benzodiazepine this month as per home medications reviewed by pharmacy.  COPD: Stable without clinical bronchospasm.  Tobacco cessation counseled.  Chronic pain: Resumed prior home dose of OxyIR as needed, continue baclofen, gabapentin and Lamictal.  Body mass index is 29.94 kg/m./Overweight   DVT prophylaxis: Lovenox Code Status: Full Family Communication: None at bedside Disposition:  . Patient came from: Home           . Anticipated d/c place: Home . Barriers to d/c: Cellulitis of right leg that needs further IV antibiotics given recent complex hospitalization for same at OSH.   Consultants:   Podiatry/Dr. March Rummage  Procedures:   None  Antimicrobials:   IV cefepime and vancomycin 3/12 >   Subjective:  Did not report much pain when I saw her this morning.  States that the right leg swelling, redness were about the same as on admission without much change.  However subsequently RN paged indicating that patient had pain not controlled with morphine and wanted to resume her OxyIR.  Objective:   Vitals:   01/11/20 0611 01/11/20 0907 01/11/20 0937 01/11/20 1059  BP: (!) 94/52 (!) 111/58 108/60 (!) 100/59  Pulse: 73 72 70 72  Resp: 17  18 16   Temp: 98  F (36.7 C) 98 F (36.7 C) 98.2 F (36.8 C) 98 F (36.7 C)  TempSrc: Oral Oral Oral Oral  SpO2: 94%  98% 94%  Weight:      Height:        General exam: Pleasant young female, moderately built and overweight lying comfortably propped up in bed without  distress. Respiratory system: Clear to auscultation. Respiratory effort normal. Cardiovascular system: S1 & S2 heard, RRR. No JVD, murmurs, rubs, gallops or clicks. No pedal edema. Gastrointestinal system: Abdomen is nondistended, soft and nontender. No organomegaly or masses felt. Normal bowel sounds heard. Central nervous system: Alert and oriented. No focal neurological deficits. Extremities: Symmetric 5 x 5 power. Skin: Right leg mildly swollen below the knee to the foot.  Faint erythema, mild increased warmth but no tenderness of lower two thirds of right leg, almost circumferential, receding from pen marking margins drawn at OSH in [place].  No fluctuation or crepitus.  Has chronic ulcer over lateral aspect of right midfoot with chronic skin changes but no acute findings, drainage.  Trace right leg edema. Psychiatry: Judgement and insight appear normal. Mood & affect appropriate.     Data Reviewed:   I have personally reviewed following labs and imaging studies   CBC: Recent Labs  Lab 01/10/20 2319 01/11/20 0544  WBC 8.4 9.5  NEUTROABS 4.6  --   HGB 13.0 12.2  HCT 41.2 39.2  MCV 88.6 88.7  PLT 538* 507*    Basic Metabolic Panel: Recent Labs  Lab 01/10/20 2319 01/11/20 0049 01/11/20 0544  NA 131*  --  136  K 5.0  --  4.7  CL 92*  --  99  CO2 31  --  28  GLUCOSE 503* 464* 136*  BUN 19  --  20  CREATININE 1.18*  --  1.09*  CALCIUM 9.0  --  8.9    Liver Function Tests: Recent Labs  Lab 01/11/20 0544  AST 18  ALT 14  ALKPHOS 88  BILITOT 1.0  PROT 7.4  ALBUMIN 2.7*    CBG: Recent Labs  Lab 01/11/20 0247 01/11/20 0816 01/11/20 1153  GLUCAP 191* 200* 205*    Microbiology Studies:  No results found for this or any previous visit (from the past 240 hour(s)).   Radiology Studies:  No results found.   Scheduled Meds:   . aspirin  325 mg Oral Daily  . baclofen  20 mg Oral BID  . bisacodyl  10 mg Oral QHS  . cholecalciferol  1,000 Units Oral  Daily  . enoxaparin (LOVENOX) injection  40 mg Subcutaneous QHS  . ezetimibe  10 mg Oral Daily  . gabapentin  800 mg Oral QID  . insulin aspart  0-15 Units Subcutaneous TID WC  . insulin aspart  0-5 Units Subcutaneous QHS  . insulin aspart protamine- aspart  30 Units Subcutaneous BID WC  . lamoTRIgine  25 mg Oral BID  . [START ON 01/12/2020] levothyroxine  75 mcg Oral Q0600  . nicotine  21 mg Transdermal Daily  . rOPINIRole  0.5 mg Oral QHS  . senna-docusate  1 tablet Oral QHS  . [START ON 01/12/2020] vitamin B-12  1,000 mcg Oral Daily    Continuous Infusions:   . ceFEPime (MAXIPIME) IV 2 g (01/11/20 0554)  . vancomycin       LOS: 1 day     Vernell Leep, MD, [place], [facility]. [facility]    To contact the attending provider between 7A-7P or the covering provider during  after hours 7P-7A, please log into the web site [email or web address] and access using universal [place] password for that web site. If you do not have the password, please call the hospital operator.  01/11/2020, 12:35 PM

## 2020-01-11 NOTE — Progress Notes (Signed)
CRITICAL VALUE ALERT  Critical Value:  Glucose lab draw 503 draw 2319, not called from lab until 1236 3/13, CBG 407 at 2333. Dr Humphrey Rolls notified of 407 and order received for 10 unis SQ. With recheck 1 hr after. Have entered an orde for lab draw for recheck. Will notify Dr. Humphrey Rolls of results when pended  Date & Time Notied:  2345 3/12  Provider Notified:Dr. Humphrey Rolls  Orders Received/Actions taken: yes

## 2020-01-11 NOTE — Plan of Care (Signed)
  Problem: Nutrition: Goal: Adequate nutrition will be maintained Outcome: Progressing   Problem: Pain Managment: Goal: General experience of comfort will improve Outcome: Progressing   Problem: Clinical Measurements: Goal: Respiratory complications will improve Outcome: Not Applicable   Problem: Activity: Goal: Risk for activity intolerance will decrease Outcome: Not Applicable   Problem: Elimination: Goal: Will not experience complications related to bowel motility Outcome: Not Applicable Goal: Will not experience complications related to urinary retention Outcome: Not [facility] admission book reviewed and given to pt

## 2020-01-12 ENCOUNTER — Encounter (HOSPITAL_COMMUNITY): Payer: Medicare Other

## 2020-01-12 LAB — GLUCOSE, CAPILLARY
Glucose-Capillary: 234 mg/dL — ABNORMAL HIGH (ref 70–99)
Glucose-Capillary: 223 mg/dL — ABNORMAL HIGH (ref 70–99)
Glucose-Capillary: 192 mg/dL — ABNORMAL HIGH (ref 70–99)
Glucose-Capillary: 198 mg/dL — ABNORMAL HIGH (ref 70–99)

## 2020-01-12 LAB — SARS CORONAVIRUS 2 (TAT 6-24 HRS): SARS Coronavirus 2: NEGATIVE

## 2020-01-12 MED ORDER — INSULIN ASPART 100 UNIT/ML ~~LOC~~ SOLN
0.0000 [IU] | Freq: Three times a day (TID) | SUBCUTANEOUS | Status: DC
  Administered 2020-01-12 – 2020-01-13 (×2): 2 [IU] via SUBCUTANEOUS
  Administered 2020-01-13: 3 [IU] via SUBCUTANEOUS
  Administered 2020-01-13: 1 [IU] via SUBCUTANEOUS
  Administered 2020-01-14: 2 [IU] via SUBCUTANEOUS
  Administered 2020-01-14: 5 [IU] via SUBCUTANEOUS
  Administered 2020-01-15: 2 [IU] via SUBCUTANEOUS
  Administered 2020-01-15: 1 [IU] via SUBCUTANEOUS

## 2020-01-12 MED ORDER — MUPIROCIN CALCIUM 2 % EX CREA
TOPICAL_CREAM | Freq: Two times a day (BID) | CUTANEOUS | Status: DC
  Filled 2020-01-12: qty 15

## 2020-01-12 MED ORDER — OXYCODONE HCL 5 MG PO TABS
15.0000 mg | ORAL_TABLET | Freq: Four times a day (QID) | ORAL | Status: DC | PRN
  Administered 2020-01-12 – 2020-01-15 (×10): 15 mg via ORAL
  Filled 2020-01-12 (×10): qty 3

## 2020-01-12 MED ORDER — INSULIN ASPART 100 UNIT/ML ~~LOC~~ SOLN
0.0000 [IU] | Freq: Every day | SUBCUTANEOUS | Status: DC
  Administered 2020-01-13: 3 [IU] via SUBCUTANEOUS
  Administered 2020-01-14: 2 [IU] via SUBCUTANEOUS

## 2020-01-12 MED ORDER — INSULIN ASPART PROT & ASPART (70-30 MIX) 100 UNIT/ML ~~LOC~~ SUSP
40.0000 [IU] | Freq: Two times a day (BID) | SUBCUTANEOUS | Status: DC
  Administered 2020-01-12 – 2020-01-15 (×7): 40 [IU] via SUBCUTANEOUS
  Filled 2020-01-12: qty 10

## 2020-01-12 NOTE — Progress Notes (Signed)
PROGRESS NOTE   Katherine Herrera  [id number]    DOB: October 31, 1966    DOA: 01/10/2020  PCP: Cassandria Anger, MD   I have briefly reviewed patients previous medical records in [facility].  Chief Complaint:  Right lower extremity redness, pain and swelling.  Brief Narrative:  54 year old female, PMH of type II DM/IDDM with peripheral neuropathy, anxiety, COPD, hyperlipidemia, hypothyroid, chronic pain, chronic right foot ulcer followed by Dr. March Rummage, podiatry, recently hospitalized in [place] for 5 days due to septic shock from severe sepsis, cellulitis and wound infection, reportedly required Levophed in the hospital, discharged approximately 5 days prior to this admission, return home and followed up with her podiatrist on day of admission who was concerned regarding ongoing cellulitis of her right leg and was referred to [facility] for admission for IV antibiotics.  Improving.   Assessment & Plan:  Principal Problem:   Cellulitis of right lower leg Active Problems:   Hypothyroidism   Dyslipidemia   TOBACCO USE DISORDER/SMOKER-SMOKING CESSATION DISCUSSED   Polyneuropathy due to type 2 diabetes mellitus (HCC)   Capsulitis of metatarsophalangeal (MTP) joint of right foot   Right leg cellulitis complicating chronic right foot wound, diabetes with peripheral neuropathy: Recent hospitalization at OSH for septic shock related to same.  Hemodynamically stable.  Reportedly had MRI in [place], will defer repeat imaging to Dr. March Rummage.  Continue empirically started IV Cefepime and Vancomycin.  Improving.  Plan is to continue IV antibiotics for additional 24 hours and then discharged home possibly tomorrow.  Podiatry/Dr. March Rummage input appreciated and agree with getting RLE venous Doppler to rule out DVT.  Applying Silvadene to the blisters on lateral aspect of right calf and foot wound.  Type II DM with peripheral neuropathy: A1c 9.8 on 3/12 suggesting poor outpatient control.   Patient reports that her CBGs at home usually range in the 220-280 mg per DL range but of late for the last couple of days they have been in the 300-400 range despite compliance with her insulins and medications.  She presented with blood glucose of 503.   Continue prior home gabapentin and Lamictal for neuropathic pain.  CBGs persisting in the 200s.  Increased home 70/30 insulin to her home dose of 40 units twice daily, continue SSI and monitor closely.  Follows with Dr. Chalmers Cater, Endocrinology.  Hypothyroidism: Continue Synthroid.  Hyperlipidemia: Continue Zetia.  Tobacco abuse: Cessation counseled.  Anxiety disorder: Stable.  Does not appear to have used benzodiazepine this month as per home medications reviewed by pharmacy.  COPD: Stable without clinical bronchospasm.  Tobacco cessation counseled.  Chronic pain: Resumed prior home dose of OxyIR as needed, continue baclofen, gabapentin and Lamictal.  Based on med review in the hospital, only used OxyIR twice yesterday and hence reduced OxyIR from 6 to prior home dose of 4 times daily as needed.  Follows with her PCP for chronic neck and back pain issues from prior surgery.  Body mass index is 29.94 kg/m./Overweight   DVT prophylaxis: Lovenox Code Status: Full Family Communication: None at bedside Disposition:  . Patient came from: Home           . Anticipated d/c place: Home . Barriers to d/c: Cellulitis of right leg that needs further IV antibiotics given recent complex hospitalization for same at OSH.  Hopeful DC home 3/15.   Consultants:   Podiatry/Dr. March Rummage  Procedures:   None  Antimicrobials:   IV cefepime and vancomycin 3/12 >   Subjective:  Redness  of right leg has resolved.  Pain in right leg this morning.  Follows with PCP for chronic neck and back pain.  Denies any other complaints.  Objective:   Vitals:   01/11/20 2044 01/11/20 2058 01/12/20 0517 01/12/20 1323  BP: 116/65  (!) 95/46 125/66  Pulse: 68  61 63   Resp: 18  16 18   Temp:  97.6 F (36.4 C) 97.8 F (36.6 C) 97.8 F (36.6 C)  TempSrc:  Oral Oral Oral  SpO2: 96%  94% 98%  Weight:      Height:        General exam: Pleasant young female, moderately built and overweight lying comfortably propped up in bed without distress. Respiratory system: Clear to auscultation.  No increased work of breathing. Cardiovascular system: S1 & S2 heard, RRR. No JVD, murmurs, rubs, gallops or clicks. No pedal edema. Gastrointestinal system: Abdomen is nondistended, soft and nontender. No organomegaly or masses felt. Normal bowel sounds heard. Central nervous system: Alert and oriented. No focal neurological deficits. Extremities: Symmetric 5 x 5 power. Skin: Right leg findings much improved compared to yesterday.  Erythema has almost resolved.  No increased warmth.  Minimal patchy tenderness.  Multiple healing blisters over lateral aspect of right calf. No fluctuation or crepitus.  Has chronic ulcer over lateral aspect of right midfoot with chronic skin changes but no acute findings, drainage.  Trace right leg edema. Psychiatry: Judgement and insight appear normal. Mood & affect appropriate.     Data Reviewed:   I have personally reviewed following labs and imaging studies   CBC: Recent Labs  Lab 01/10/20 2319 01/11/20 0544  WBC 8.4 9.5  NEUTROABS 4.6  --   HGB 13.0 12.2  HCT 41.2 39.2  MCV 88.6 88.7  PLT 538* 507*    Basic Metabolic Panel: Recent Labs  Lab 01/10/20 2319 01/11/20 0049 01/11/20 0544  NA 131*  --  136  K 5.0  --  4.7  CL 92*  --  99  CO2 31  --  28  GLUCOSE 503* 464* 136*  BUN 19  --  20  CREATININE 1.18*  --  1.09*  CALCIUM 9.0  --  8.9    Liver Function Tests: Recent Labs  Lab 01/11/20 0544  AST 18  ALT 14  ALKPHOS 88  BILITOT 1.0  PROT 7.4  ALBUMIN 2.7*    CBG: Recent Labs  Lab 01/11/20 2125 01/12/20 0732 01/12/20 1207  GLUCAP 274* 234* 223*    Microbiology Studies:   Recent Results (from  the past 240 hour(s))  SARS CORONAVIRUS 2 (TAT 6-24 HRS) Nasopharyngeal Nasopharyngeal Swab     Status: None   Collection Time: 01/12/20 12:01 AM   Specimen: Nasopharyngeal Swab  Result Value Ref Range Status   SARS Coronavirus 2 NEGATIVE NEGATIVE Final    Comment: (NOTE) SARS-CoV-2 target nucleic acids are NOT DETECTED. The SARS-CoV-2 RNA is generally detectable in upper and lower respiratory specimens during the acute phase of infection. Negative results do not preclude SARS-CoV-2 infection, do not rule out co-infections with other pathogens, and should not be used as the sole basis for treatment or other patient management decisions. Negative results must be combined with clinical observations, patient history, and epidemiological information. The expected result is Negative. Fact Sheet for Patients: SugarRoll.be Fact Sheet for Healthcare Providers: https://www.woods-mathews.com/ This test is not yet approved or cleared by the [place] FDA and  has been authorized for detection and/or diagnosis of SARS-CoV-2 by FDA under an  Emergency Use Authorization (EUA). This EUA will remain  in effect (meaning this test can be used) for the duration of the COVID-19 declaration under Section 56 4(b)(1) of the Act, 21 U.S.C. section 360bbb-3(b)(1), unless the authorization is terminated or revoked sooner. Performed at [facility], [place] [address], [place], [place] [address]      Radiology Studies:  No results found.   Scheduled Meds:   . aspirin  325 mg Oral Daily  . baclofen  20 mg Oral BID  . bisacodyl  10 mg Oral QHS  . cholecalciferol  1,000 Units Oral Daily  . enoxaparin (LOVENOX) injection  40 mg Subcutaneous QHS  . ezetimibe  10 mg Oral Daily  . gabapentin  800 mg Oral QID  . insulin aspart  0-15 Units Subcutaneous TID WC  . insulin aspart  0-5 Units Subcutaneous QHS  . insulin aspart protamine- aspart  40 Units  Subcutaneous BID WC  . lamoTRIgine  25 mg Oral BID  . levothyroxine  75 mcg Oral Q0600  . mupirocin cream   Topical BID  . nicotine  21 mg Transdermal Daily  . rOPINIRole  0.5 mg Oral QHS  . senna-docusate  1 tablet Oral QHS  . vitamin B-12  1,000 mcg Oral Daily    Continuous Infusions:   . ceFEPime (MAXIPIME) IV 2 g (01/12/20 1304)  . vancomycin 1,750 mg (01/11/20 2116)     LOS: 2 days     Vernell Leep, MD, [place], [facility]. [facility]    To contact the attending provider between 7A-7P or the covering provider during after hours 7P-7A, please log into the web site [email or web address] and access using universal [place] password for that web site. If you do not have the password, please call the hospital operator.  01/12/2020, 1:46 PM

## 2020-01-12 NOTE — Progress Notes (Signed)
  Subjective:  Patient ID: Katherine Herrera, female    DOB: [birth year],  MRN: [id number]  Patient seen bedside. Pain getting a little better but still having pain.  Objective:   Vitals:   01/11/20 2058 01/12/20 0517  BP:  (!) 95/46  Pulse:  61  Resp:  16  Temp: 97.6 F (36.4 C) 97.8 F (36.6 C)  SpO2:  94%   General AA&O x3. Normal mood and affect.  Vascular Dorsalis pedis and posterior tibial pulses 2/4 bilat. Brisk capillary refill to all digits. Pedal hair present.  Neurologic Epicritic sensation grossly intact.  Dermatologic Right leg cellulitis, blistering noted. Cellulitis improving, no active redness today.  Orthopedic: MMT 5/5 in dorsiflexion, plantarflexion, inversion, and eversion. Normal joint ROM without pain or crepitus.    Assessment & Plan:  Patient was evaluated and treated and all questions answered.  Cellulitis R Leg -Wound care: at this point will apply silvadene to the blisters about the wound and to the plantar foot wound. Orders placed -DVT US RLE r/o DVT given calf muscle pain -Continue abx -Re-educated on pain medication regimen. Oxycodone was chagned to q4 and morphine 2mg  on PRN for breakthrough pain -Cellulitis improving, likely d/c tomorrow or the day after. -Will continue to follow  Evelina Bucy, DPM  Accessible via secure chat for questions or concerns.

## 2020-01-13 ENCOUNTER — Inpatient Hospital Stay (HOSPITAL_COMMUNITY): Payer: Medicare Other

## 2020-01-13 DIAGNOSIS — L0390 Cellulitis, unspecified: Secondary | ICD-10-CM

## 2020-01-13 DIAGNOSIS — R609 Edema, unspecified: Secondary | ICD-10-CM

## 2020-01-13 DIAGNOSIS — M7989 Other specified soft tissue disorders: Secondary | ICD-10-CM

## 2020-01-13 LAB — GLUCOSE, CAPILLARY
Glucose-Capillary: 205 mg/dL — ABNORMAL HIGH (ref 70–99)
Glucose-Capillary: 129 mg/dL — ABNORMAL HIGH (ref 70–99)
Glucose-Capillary: 199 mg/dL — ABNORMAL HIGH (ref 70–99)
Glucose-Capillary: 261 mg/dL — ABNORMAL HIGH (ref 70–99)

## 2020-01-13 NOTE — Progress Notes (Signed)
Right lower extremity venous duplex exam completed.  Preliminary results can be found under CV proc under chart review.  01/13/2020 9:02 AM  Scotty Weigelt, K., RDMS, RVT

## 2020-01-13 NOTE — Progress Notes (Signed)
PROGRESS NOTE   Katherine Herrera  [id number]    DOB: [birth year]    DOA: 01/10/2020  PCP: Cassandria Anger, MD   I have briefly reviewed patients previous medical records in [facility].  Chief Complaint:  Right lower extremity redness, pain and swelling.  Brief Narrative:  54 year old female, PMH of type II DM/IDDM with peripheral neuropathy, anxiety, COPD, hyperlipidemia, hypothyroid, chronic pain, chronic right foot ulcer followed by Dr. March Rummage, podiatry, recently hospitalized in [place] for 5 days due to septic shock from severe sepsis, cellulitis and wound infection, reportedly required Levophed in the hospital, discharged approximately 5 days prior to this admission, return home and followed up with her podiatrist on day of admission who was concerned regarding ongoing cellulitis of her right leg and was referred to [facility] for admission for IV antibiotics.  Improving.   Assessment & Plan:  Principal Problem:   Cellulitis of right lower leg Active Problems:   Hypothyroidism   Dyslipidemia   TOBACCO USE DISORDER/SMOKER-SMOKING CESSATION DISCUSSED   Polyneuropathy due to type 2 diabetes mellitus (HCC)   Capsulitis of metatarsophalangeal (MTP) joint of right foot   Right leg cellulitis complicating chronic right foot wound, diabetes with peripheral neuropathy: Recent hospitalization at OSH for septic shock related to same.  Hemodynamically stable.  Reportedly had MRI in [place], no indication to repeat.  Treated empirically with IV cefepime and vancomycin, day 4 today.  Right lower extremity venous Doppler negative for DVT.  Applying Silvadene to the blisters on lateral aspect of right calf and foot wound. Continues to improve.  Podiatry/Dr. March Rummage follow-up today appreciated and he feels that the cellulitis is not fully resolved and recommends possible discharge tomorrow.  Type II DM with peripheral neuropathy: A1c 9.8 on 3/12 suggesting poor outpatient control.   Patient reports that her CBGs at home usually range in the 220-280 mg per DL range but of late for the last couple of days they have been in the 300-400 range despite compliance with her insulins and medications.  She presented with blood glucose of 503.   Continue prior home gabapentin and Lamictal for neuropathic pain.  CBGs persisting in the 200s.  Increased home 70/30 insulin to her home dose of 40 units twice daily, continue SSI and monitor closely.  Follows with Dr. Chalmers Cater, Endocrinology.  Better glycemic control since adjusting insulin doses yesterday.  Hypothyroidism: Continue Synthroid.  Hyperlipidemia: Continue Zetia.  Tobacco abuse: Cessation counseled.  Anxiety disorder: Stable.  Does not appear to have used benzodiazepine this month as per home medications reviewed by pharmacy.  COPD: Stable without clinical bronchospasm.  Tobacco cessation counseled.  Chronic pain: Resumed prior home dose of OxyIR as needed, continue baclofen, gabapentin and Lamictal.  Follows with her PCP for chronic neck and back pain issues from prior surgery.  Reports some ongoing pain in the right leg.  Body mass index is 29.94 kg/m./Overweight   DVT prophylaxis: Lovenox Code Status: Full Family Communication: None at bedside Disposition:  . Patient came from: Home           . Anticipated d/c place: Home . Barriers to d/c: Cellulitis of right leg that needs further IV antibiotics given recent complex hospitalization for same at OSH and clearance by Dr. March Rummage.  Hopeful discharge 3/16.   Consultants:   Podiatry/Dr. March Rummage  Procedures:   None  Antimicrobials:   IV cefepime and vancomycin 3/12 >   Subjective:  Ongoing some pain in the right leg mostly in the  posterior aspect.  Objective:   Vitals:   01/12/20 2226 01/13/20 0621 01/13/20 0911 01/13/20 1329  BP: 121/71 (!) 94/53 (!) 95/53 (!) 94/56  Pulse: 72 62 63 69  Resp: 16 16 15 16   Temp: 98.2 F (36.8 C) 97.9 F (36.6 C) 97.6 F (36.4  C) (!) 97.5 F (36.4 C)  TempSrc: Oral Oral Oral Oral  SpO2: 94% 96% 94% 95%  Weight:      Height:        General exam: Pleasant young female, moderately built and overweight lying comfortably propped up in bed without distress. Respiratory system: Clear to auscultation.  No increased work of breathing. Cardiovascular system: S1 & S2 heard, RRR. No JVD, murmurs, rubs, gallops or clicks. No pedal edema. Gastrointestinal system: Abdomen is nondistended, soft and nontender. No organomegaly or masses felt. Normal bowel sounds heard. Central nervous system: Alert and oriented. No focal neurological deficits. Extremities: Symmetric 5 x 5 power. Skin: Right leg and foot dressing clean and dry.  These had just been changed by RN who reported no acute findings. Psychiatry: Judgement and insight appear normal. Mood & affect appropriate.     Data Reviewed:   I have personally reviewed following labs and imaging studies   CBC: Recent Labs  Lab 01/10/20 2319 01/11/20 0544  WBC 8.4 9.5  NEUTROABS 4.6  --   HGB 13.0 12.2  HCT 41.2 39.2  MCV 88.6 88.7  PLT 538* 507*    Basic Metabolic Panel: Recent Labs  Lab 01/10/20 2319 01/11/20 0049 01/11/20 0544  NA 131*  --  136  K 5.0  --  4.7  CL 92*  --  99  CO2 31  --  28  GLUCOSE 503* 464* 136*  BUN 19  --  20  CREATININE 1.18*  --  1.09*  CALCIUM 9.0  --  8.9    Liver Function Tests: Recent Labs  Lab 01/11/20 0544  AST 18  ALT 14  ALKPHOS 88  BILITOT 1.0  PROT 7.4  ALBUMIN 2.7*    CBG: Recent Labs  Lab 01/13/20 0752 01/13/20 1229 01/13/20 1701  GLUCAP 205* 129* 199*    Microbiology Studies:   Recent Results (from the past 240 hour(s))  SARS CORONAVIRUS 2 (TAT 6-24 HRS) Nasopharyngeal Nasopharyngeal Swab     Status: None   Collection Time: 01/12/20 12:01 AM   Specimen: Nasopharyngeal Swab  Result Value Ref Range Status   SARS Coronavirus 2 NEGATIVE NEGATIVE Final    Comment: (NOTE) SARS-CoV-2 target nucleic  acids are NOT DETECTED. The SARS-CoV-2 RNA is generally detectable in upper and lower respiratory specimens during the acute phase of infection. Negative results do not preclude SARS-CoV-2 infection, do not rule out co-infections with other pathogens, and should not be used as the sole basis for treatment or other patient management decisions. Negative results must be combined with clinical observations, patient history, and epidemiological information. The expected result is Negative. Fact Sheet for Patients: SugarRoll.be Fact Sheet for Healthcare Providers: https://www.woods-mathews.com/ This test is not yet approved or cleared by the [place] FDA and  has been authorized for detection and/or diagnosis of SARS-CoV-2 by FDA under an Emergency Use Authorization (EUA). This EUA will remain  in effect (meaning this test can be used) for the duration of the COVID-19 declaration under Section 56 4(b)(1) of the Act, 21 U.S.C. section 360bbb-3(b)(1), unless the authorization is terminated or revoked sooner. Performed at [facility], [place] [address] [place], [place] [address]  Radiology Studies:  VAS [place] LOWER EXTREMITY VENOUS (DVT)  Result Date: 01/13/2020  Lower Venous DVTStudy Indications: Swelling, Edema, and ulceration. Other Indications: History of right leg cellulitis. Anticoagulation: Lovenox. Limitations: Body habitus, poor ultrasound/tissue interface and bandages. Comparison Study: LEV 07-06-19, negative. Performing Technologist: Baldwin Crown ARDMS, RVT  Examination Guidelines: A complete evaluation includes B-mode imaging, spectral Doppler, color Doppler, and power Doppler as needed of all accessible portions of each vessel. Bilateral testing is considered an integral part of a complete examination. Limited examinations for reoccurring indications may be performed as noted. The reflux portion of the exam is performed with the  patient in reverse Trendelenburg.  +---------+---------------+---------+-----------+----------+------------------+ RIGHT    CompressibilityPhasicitySpontaneityPropertiesThrombus Aging     +---------+---------------+---------+-----------+----------+------------------+ CFV      Full           Yes      Yes                                     +---------+---------------+---------+-----------+----------+------------------+ SFJ      Full                                                            +---------+---------------+---------+-----------+----------+------------------+ FV Prox  Full                                                            +---------+---------------+---------+-----------+----------+------------------+ FV Mid   Full                                                            +---------+---------------+---------+-----------+----------+------------------+ FV DistalFull                                                            +---------+---------------+---------+-----------+----------+------------------+ PFV      Full                                                            +---------+---------------+---------+-----------+----------+------------------+ POP      Full           Yes      Yes                                     +---------+---------------+---------+-----------+----------+------------------+ PTV      Full  visualized with                                                          color              +---------+---------------+---------+-----------+----------+------------------+ PERO                                                  visualized with                                                          color              +---------+---------------+---------+-----------+----------+------------------+   +----+---------------+---------+-----------+----------+--------------+  LEFTCompressibilityPhasicitySpontaneityPropertiesThrombus Aging +----+---------------+---------+-----------+----------+--------------+ CFV Full           Yes      Yes                                 +----+---------------+---------+-----------+----------+--------------+ Poorly visualized right calf veins due to edema. Right mid calf veins not visualized due to bandage and open wounds.  Summary: RIGHT: - There is no evidence of deep vein thrombosis in the lower extremity. However, portions of this examination were limited- see technologist comments above.  - No cystic structure found in the popliteal fossa.  LEFT: - No evidence of common femoral vein obstruction.  *See table(s) above for measurements and observations.    Preliminary      Scheduled Meds:   . aspirin  325 mg Oral Daily  . baclofen  20 mg Oral BID  . bisacodyl  10 mg Oral QHS  . cholecalciferol  1,000 Units Oral Daily  . enoxaparin (LOVENOX) injection  40 mg Subcutaneous QHS  . ezetimibe  10 mg Oral Daily  . gabapentin  800 mg Oral QID  . insulin aspart  0-5 Units Subcutaneous QHS  . insulin aspart  0-9 Units Subcutaneous TID WC  . insulin aspart protamine- aspart  40 Units Subcutaneous BID WC  . lamoTRIgine  25 mg Oral BID  . levothyroxine  75 mcg Oral Q0600  . mupirocin cream   Topical BID  . nicotine  21 mg Transdermal Daily  . rOPINIRole  0.5 mg Oral QHS  . senna-docusate  1 tablet Oral QHS  . vitamin B-12  1,000 mcg Oral Daily    Continuous Infusions:   . ceFEPime (MAXIPIME) IV Stopped (01/13/20 1317)  . vancomycin Stopped (01/13/20 0025)     LOS: 3 days     Vernell Leep, MD, [place], [facility]. [facility]    To contact the attending provider between 7A-7P or the covering provider during after hours 7P-7A, please log into the web site [email or web address] and access using universal [place] password for that web site. If you do not have the password, please call the hospital operator.  01/13/2020,  5:07 PM

## 2020-01-13 NOTE — Progress Notes (Signed)
  Subjective:  Patient ID: Katherine Herrera, female    DOB: 09/13/1966,  MRN: [id number]  Patient seen bedside. Pain getting better. Worst at nighttime. Denies new issues.  Objective:   Vitals:   01/13/20 0911 01/13/20 1329  BP: (!) 95/53 (!) 94/56  Pulse: 63 69  Resp: 15 16  Temp: 97.6 F (36.4 C) (!) 97.5 F (36.4 C)  SpO2: 94% 95%   General AA&O x3. Normal mood and affect.  Vascular Dorsalis pedis and posterior tibial pulses 2/4 bilat. Brisk capillary refill to all digits. Pedal hair present.  Neurologic Epicritic sensation grossly intact.  Dermatologic Right leg cellulitis, blistering noted. Cellulitis improving, but with still some active redness today  Orthopedic: MMT 5/5 in dorsiflexion, plantarflexion, inversion, and eversion. Normal joint ROM without pain or crepitus.    Assessment & Plan:  Patient was evaluated and treated and all questions answered.  Cellulitis R Leg -Wound care: silvadene to blisters and plantar wound, DSD -DVT US negative -Continue abx -Continue to work on pain control. -Cellulitis not fully resolved. Plan for discharge tomorrow if amenable. -Will continue to follow  Evelina Bucy, DPM  Accessible via secure chat for questions or concerns.

## 2020-01-13 NOTE — Care Management Important Message (Signed)
Important Message  Patient Details IM Letter given to Marney Doctor RN Case Manager to present to the Patient Name: SACHI MAW MRN: [id number] Date of Birth: August 22, 1966   Medicare Important Message Given:  Yes     Kerin Salen 01/13/2020, 10:45 AM

## 2020-01-14 LAB — CBC
HCT: 40.9 % (ref 36.0–46.0)
Hemoglobin: 12.7 g/dL (ref 12.0–15.0)
MCH: 27.6 pg (ref 26.0–34.0)
MCHC: 31.1 g/dL (ref 30.0–36.0)
MCV: 88.9 fL (ref 80.0–100.0)
Platelets: 425 10*3/uL — ABNORMAL HIGH (ref 150–400)
RBC: 4.6 MIL/uL (ref 3.87–5.11)
RDW: 13.4 % (ref 11.5–15.5)
WBC: 7.5 10*3/uL (ref 4.0–10.5)
nRBC: 0 % (ref 0.0–0.2)

## 2020-01-14 LAB — GLUCOSE, CAPILLARY
Glucose-Capillary: 105 mg/dL — ABNORMAL HIGH (ref 70–99)
Glucose-Capillary: 282 mg/dL — ABNORMAL HIGH (ref 70–99)
Glucose-Capillary: 189 mg/dL — ABNORMAL HIGH (ref 70–99)
Glucose-Capillary: 206 mg/dL — ABNORMAL HIGH (ref 70–99)

## 2020-01-14 LAB — BASIC METABOLIC PANEL
Anion gap: 9 (ref 5–15)
BUN: 22 mg/dL — ABNORMAL HIGH (ref 6–20)
CO2: 21 mmol/L — ABNORMAL LOW (ref 22–32)
Calcium: 8.9 mg/dL (ref 8.9–10.3)
Chloride: 106 mmol/L (ref 98–111)
Creatinine, Ser: 1.11 mg/dL — ABNORMAL HIGH (ref 0.44–1.00)
GFR calc Af Amer: >60 mL/min (ref >60)
GFR calc non Af Amer: 56 mL/min — ABNORMAL LOW (ref >60)
Glucose, Bld: 109 mg/dL — ABNORMAL HIGH (ref 70–99)
Potassium: 6 mmol/L — ABNORMAL HIGH (ref 3.5–5.1)
Sodium: 136 mmol/L (ref 135–145)

## 2020-01-14 MED ORDER — SODIUM ZIRCONIUM CYCLOSILICATE 10 G PO PACK
10.0000 g | PACK | Freq: Once | ORAL | Status: AC
  Administered 2020-01-14: 10 g via ORAL
  Filled 2020-01-14: qty 1

## 2020-01-14 NOTE — Progress Notes (Signed)
Pharmacy Antibiotic Note  Katherine Herrera is a 54 y.o. female admitted on 01/10/2020 with wound infection.  Pharmacy has been consulted for Vancomycin, cefepime dosing.  01/14/2020  D#4 Vancomycin & Cefepime for R leg cellulitis WBC 7.5 - WNL, stable SCr 1.11 - stable AF   Plan: Vancomycin 1750 mg IV Q 24 hrs. Goal AUC 400-550. Expected AUC: 488 SCr used: 1.18 Cefepime 2gm IV q8hr Anticipate DC home soon, no levels planned   Height: 5' 9.5" (176.5 cm) Weight: 205 lb 11 oz (93.3 kg) IBW/kg (Calculated) : 67.35  Temp (24hrs), Avg:97.7 F (36.5 C), Min:97.5 F (36.4 C), Max:98.1 F (36.7 C)  Recent Labs  Lab 01/10/20 2319 01/11/20 0544 01/14/20 0633  WBC 8.4 9.5 7.5  CREATININE 1.18* 1.09* 1.11*    Estimated Creatinine Clearance: 71.2 mL/min (A) (by C-G formula based on SCr of 1.11 mg/dL (H)).    Allergies  Allergen Reactions  . Hydromorphone Hcl Itching    Patient has been tolerating Hydromorphone tablets without adverse effect (07/09/19)  . Cephalexin Nausea And Vomiting  . Demerol  [Meperidine Hcl] Rash  . Lovastatin Other (See Comments)    Other reaction(s): Unknown (See Comments) Possible myalgia Unknown   . Metformin And Related Nausea And Vomiting  . Sulfamethoxazole-Trimethoprim     Other reaction(s): Unknown DILI, pancreatitis  . Crestor [Rosuvastatin Calcium]     myalgia  . Niacin Other (See Comments)    Unknown   . Paroxetine Other (See Comments)    Other reaction(s): Unknown (See Comments) unknown Unknown     Antimicrobials this admission: Vancomycin 01/14/2020 >> Cefepime 01/14/2020 >>  Dose adjustments this admission:-  Microbiology results: 3/14 Covid neg  Thank you for allowing pharmacy to be a part of this patient's care.  Eudelia Bunch, Pharm.D [phone] 01/14/2020 9:57 AM

## 2020-01-14 NOTE — Progress Notes (Signed)
  Subjective:  Patient ID: Katherine Herrera, female    DOB: 01/07/1966,  MRN: [id number]  Patient seen bedside. Having pain in the front of the ankle but thinks it might be from the dressing.  Objective:   Vitals:   01/13/20 2039 01/14/20 0551  BP: 127/60 (!) 103/57  Pulse: 69 62  Resp: 16 14  Temp: 98.1 F (36.7 C) 97.6 F (36.4 C)  SpO2: 97% 96%   General AA&O x3. Normal mood and affect.  Vascular Dorsalis pedis and posterior tibial pulses 2/4 bilat. Brisk capillary refill to all digits. Pedal hair present.  Neurologic Epicritic sensation grossly intact.  Dermatologic Right leg cellulitis, blistering noted. Cellulitis improving, with still one large central anterior area of active cellulitis  Orthopedic: MMT 5/5 in dorsiflexion, plantarflexion, inversion, and eversion. Normal joint ROM without pain or crepitus.    Assessment & Plan:  Patient was evaluated and treated and all questions answered.  Cellulitis R Leg -Wound care: silvadene to blisters and plantar wound, DSD -Continue abx -Continue to work on pain control. -Cellulitis almost resolved. I think it should be resolved tomorrow and she will be fine for d/c at that time. -Will continue to follow  Evelina Bucy, DPM  Accessible via secure chat for questions or concerns.

## 2020-01-14 NOTE — Progress Notes (Signed)
Performed wound per order/instruction. Patient tolerated well. Pain med also given prior to wound redressing.

## 2020-01-14 NOTE — Progress Notes (Signed)
PROGRESS NOTE    Katherine Herrera  [id number] DOB: 07-05-1966 DOA: 01/10/2020 PCP: Cassandria Anger, MD   Brief Narrative:  54 year old with history of DM2 with peripheral neuropathy, anxiety, COPD, HLD, hypothyroidism, chronic pain with chronic foot ulcer admitted to the hospital for right lower extremity cellulitis.  Initially was in septic shock on previous admission and then discharged home.  But due to worsening of the cellulitis she was sent back to the hospital.  Greatly improving with current antibiotics.   Assessment & Plan:   Principal Problem:   Cellulitis of right lower leg Active Problems:   Hypothyroidism   Dyslipidemia   TOBACCO USE DISORDER/SMOKER-SMOKING CESSATION DISCUSSED   Polyneuropathy due to type 2 diabetes mellitus (HCC)   Capsulitis of metatarsophalangeal (MTP) joint of right foot  Right lower extremity cellulitis with chronic right foot wound Diabetes mellitus type 2 with peripheral neuropathy -Currently appears to be improving on IV vancomycin and cefepime, day 5.  Per podiatry continue this for 1 more day and discharge her tomorrow. -Pain control.  Lower extremity Dopplers-negative for DVT -Bear weight as tolerated. -70/30 insulin 40 units twice daily, sliding scale. -Gabapentin 800 mg 4 times daily  Hyperkalemia -Lokelma ordered, stopped all potassium supplements  Hypothyroidism -Synthroid  Hyperlipidemia -Zetia  Anxiety disorder   -Stable  Tobacco use History of COPD -Counseled about quit using this.  As needed bronchodilators  History of chronic pain -On home regimen  DVT prophylaxis: Lovenox Code Status: Full Family Communication: None Disposition Plan:   Patient From= home  Patient Anticipated D/C place= Home  Barriers= continue IV antibiotics today, hopefully discharge tomorrow per podiatry.    Subjective: Tells me her right lower extremity feels slightly better compared to yesterday, erythema is improving.  Able to  bear some weight on it.  Review of Systems Otherwise negative except as per HPI, including: General: Denies fever, chills, night sweats or unintended weight loss. Resp: Denies cough, wheezing, shortness of breath. Cardiac: Denies chest pain, palpitations, orthopnea, paroxysmal nocturnal dyspnea. GI: Denies abdominal pain, nausea, vomiting, diarrhea or constipation GU: Denies dysuria, frequency, hesitancy or incontinence MS: Denies muscle aches, joint pain or swelling Neuro: Denies headache, neurologic deficits (focal weakness, numbness, tingling), abnormal gait Psych: Denies anxiety, depression, SI/HI/AVH Skin: Denies new rashes or lesions ID: Denies sick contacts, exotic exposures, travel  Examination:  General exam: Appears calm and comfortable  Respiratory system: Clear to auscultation. Respiratory effort normal. Cardiovascular system: S1 & S2 heard, RRR. No JVD, murmurs, rubs, gallops or clicks. No pedal edema. Gastrointestinal system: Abdomen is nondistended, soft and nontender. No organomegaly or masses felt. Normal bowel sounds heard. Central nervous system: Alert and oriented. No focal neurological deficits. Extremities: Symmetric 5 x 5 power. Skin: Right lower extremity has some erythema but clearly regressed from previously demarcated line.  Chronic thickened skin on the lateral surface of the right foot, dry skin.  No obvious ulceration. Psychiatry: Judgement and insight appear normal. Mood & affect appropriate.     Objective: Vitals:   01/13/20 0911 01/13/20 1329 01/13/20 2039 01/14/20 0551  BP: (!) 95/53 (!) 94/56 127/60 (!) 103/57  Pulse: 63 69 69 62  Resp: 15 16 16 14   Temp: 97.6 F (36.4 C) (!) 97.5 F (36.4 C) 98.1 F (36.7 C) 97.6 F (36.4 C)  TempSrc: Oral Oral Oral Oral  SpO2: 94% 95% 97% 96%  Weight:      Height:        Intake/Output Summary (Last 24 hours) at 01/14/2020 1129  Last data filed at 01/14/2020 0224 Gross per 24 hour  Intake 615.22 ml    Output --  Net 615.22 ml   Filed Weights   01/10/20 2212  Weight: 93.3 kg     Data Reviewed:   CBC: Recent Labs  Lab 01/10/20 2319 01/11/20 0544 01/14/20 0633  WBC 8.4 9.5 7.5  NEUTROABS 4.6  --   --   HGB 13.0 12.2 12.7  HCT 41.2 39.2 40.9  MCV 88.6 88.7 88.9  PLT 538* 507* [id number]*   Basic Metabolic Panel: Recent Labs  Lab 01/10/20 2319 01/11/20 0049 01/11/20 0544 01/14/20 0633  NA 131*  --  136 136  K 5.0  --  4.7 6.0*  CL 92*  --  99 106  CO2 31  --  28 21*  GLUCOSE 503* 464* 136* 109*  BUN 19  --  20 22*  CREATININE 1.18*  --  1.09* 1.11*  CALCIUM 9.0  --  8.9 8.9   GFR: Estimated Creatinine Clearance: 71.2 mL/min (A) (by C-G formula based on SCr of 1.11 mg/dL (H)). Liver Function Tests: Recent Labs  Lab 01/11/20 0544  AST 18  ALT 14  ALKPHOS 88  BILITOT 1.0  PROT 7.4  ALBUMIN 2.7*   No results for input(s): LIPASE, AMYLASE in the last 168 hours. No results for input(s): AMMONIA in the last 168 hours. Coagulation Profile: No results for input(s): INR, PROTIME in the last 168 hours. Cardiac Enzymes: No results for input(s): CKTOTAL, CKMB, CKMBINDEX, TROPONINI in the last 168 hours. BNP (last 3 results) No results for input(s): PROBNP in the last 8760 hours. HbA1C: No results for input(s): HGBA1C in the last 72 hours. CBG: Recent Labs  Lab 01/13/20 0752 01/13/20 1229 01/13/20 1701 01/13/20 2041 01/14/20 0733  GLUCAP 205* 129* 199* 261* 105*   Lipid Profile: No results for input(s): CHOL, HDL, LDLCALC, TRIG, CHOLHDL, LDLDIRECT in the last 72 hours. Thyroid Function Tests: No results for input(s): TSH, T4TOTAL, FREET4, T3FREE, THYROIDAB in the last 72 hours. Anemia Panel: No results for input(s): VITAMINB12, FOLATE, FERRITIN, TIBC, IRON, RETICCTPCT in the last 72 hours. Sepsis Labs: No results for input(s): PROCALCITON, LATICACIDVEN in the last 168 hours.  Recent Results (from the past 240 hour(s))  SARS CORONAVIRUS 2 (TAT 6-24 HRS)  Nasopharyngeal Nasopharyngeal Swab     Status: None   Collection Time: 01/12/20 12:01 AM   Specimen: Nasopharyngeal Swab  Result Value Ref Range Status   SARS Coronavirus 2 NEGATIVE NEGATIVE Final    Comment: (NOTE) SARS-CoV-2 target nucleic acids are NOT DETECTED. The SARS-CoV-2 RNA is generally detectable in upper and lower respiratory specimens during the acute phase of infection. Negative results do not preclude SARS-CoV-2 infection, do not rule out co-infections with other pathogens, and should not be used as the sole basis for treatment or other patient management decisions. Negative results must be combined with clinical observations, patient history, and epidemiological information. The expected result is Negative. Fact Sheet for Patients: SugarRoll.be Fact Sheet for Healthcare Providers: https://www.woods-mathews.com/ This test is not yet approved or cleared by the [place] FDA and  has been authorized for detection and/or diagnosis of SARS-CoV-2 by FDA under an Emergency Use Authorization (EUA). This EUA will remain  in effect (meaning this test can be used) for the duration of the COVID-19 declaration under Section 56 4(b)(1) of the Act, 21 U.S.C. section 360bbb-3(b)(1), unless the authorization is terminated or revoked sooner. Performed at [facility], [address] [place],  [place] [address]          Radiology Studies: VAS [place] LOWER EXTREMITY VENOUS (DVT)  Result Date: 01/13/2020  Lower Venous DVTStudy Indications: Swelling, Edema, and ulceration. Other Indications: History of right leg cellulitis. Anticoagulation: Lovenox. Limitations: Body habitus, poor ultrasound/tissue interface and bandages. Comparison Study: LEV 07-06-19, negative. Performing Technologist: Baldwin Crown ARDMS, RVT  Examination Guidelines: A complete evaluation includes B-mode imaging, spectral Doppler, color Doppler, and power Doppler as  needed of all accessible portions of each vessel. Bilateral testing is considered an integral part of a complete examination. Limited examinations for reoccurring indications may be performed as noted. The reflux portion of the exam is performed with the patient in reverse Trendelenburg.  +---------+---------------+---------+-----------+----------+------------------+ RIGHT    CompressibilityPhasicitySpontaneityPropertiesThrombus Aging     +---------+---------------+---------+-----------+----------+------------------+ CFV      Full           Yes      Yes                                     +---------+---------------+---------+-----------+----------+------------------+ SFJ      Full                                                            +---------+---------------+---------+-----------+----------+------------------+ FV Prox  Full                                                            +---------+---------------+---------+-----------+----------+------------------+ FV Mid   Full                                                            +---------+---------------+---------+-----------+----------+------------------+ FV DistalFull                                                            +---------+---------------+---------+-----------+----------+------------------+ PFV      Full                                                            +---------+---------------+---------+-----------+----------+------------------+ POP      Full           Yes      Yes                                     +---------+---------------+---------+-----------+----------+------------------+ PTV      Full  visualized with                                                          color              +---------+---------------+---------+-----------+----------+------------------+ PERO                                                   visualized with                                                          color              +---------+---------------+---------+-----------+----------+------------------+   +----+---------------+---------+-----------+----------+--------------+ LEFTCompressibilityPhasicitySpontaneityPropertiesThrombus Aging +----+---------------+---------+-----------+----------+--------------+ CFV Full           Yes      Yes                                 +----+---------------+---------+-----------+----------+--------------+ Poorly visualized right calf veins due to edema. Right mid calf veins not visualized due to bandage and open wounds.  Summary: RIGHT: - There is no evidence of deep vein thrombosis in the lower extremity. However, portions of this examination were limited- see technologist comments above.  - No cystic structure found in the popliteal fossa.  LEFT: - No evidence of common femoral vein obstruction.  *See table(s) above for measurements and observations. Electronically signed by Ruta Hinds MD on 01/13/2020 at 7:16:49 PM.    Final         Scheduled Meds: . aspirin  325 mg Oral Daily  . baclofen  20 mg Oral BID  . bisacodyl  10 mg Oral QHS  . cholecalciferol  1,000 Units Oral Daily  . enoxaparin (LOVENOX) injection  40 mg Subcutaneous QHS  . ezetimibe  10 mg Oral Daily  . gabapentin  800 mg Oral QID  . insulin aspart  0-5 Units Subcutaneous QHS  . insulin aspart  0-9 Units Subcutaneous TID WC  . insulin aspart protamine- aspart  40 Units Subcutaneous BID WC  . lamoTRIgine  25 mg Oral BID  . levothyroxine  75 mcg Oral Q0600  . mupirocin cream   Topical BID  . nicotine  21 mg Transdermal Daily  . rOPINIRole  0.5 mg Oral QHS  . senna-docusate  1 tablet Oral QHS  . vitamin B-12  1,000 mcg Oral Daily   Continuous Infusions: . ceFEPime (MAXIPIME) IV 2 g (01/14/20 0612)  . vancomycin 1,750 mg (01/13/20 2115)     LOS: 4 days   Time spent= 25 mins    Orlandria Kissner Arsenio Loader, MD [facility]  If 7PM-7AM, please contact night-coverage  01/14/2020, 11:29 AM

## 2020-01-15 ENCOUNTER — Other Ambulatory Visit: Payer: Self-pay | Admitting: Podiatry

## 2020-01-15 DIAGNOSIS — M7751 Other enthesopathy of right foot: Secondary | ICD-10-CM

## 2020-01-15 LAB — GLUCOSE, CAPILLARY
Glucose-Capillary: 132 mg/dL — ABNORMAL HIGH (ref 70–99)
Glucose-Capillary: 158 mg/dL — ABNORMAL HIGH (ref 70–99)

## 2020-01-15 LAB — BASIC METABOLIC PANEL
Anion gap: 7 (ref 5–15)
BUN: 21 mg/dL — ABNORMAL HIGH (ref 6–20)
CO2: 27 mmol/L (ref 22–32)
Calcium: 8.8 mg/dL — ABNORMAL LOW (ref 8.9–10.3)
Chloride: 102 mmol/L (ref 98–111)
Creatinine, Ser: 1.1 mg/dL — ABNORMAL HIGH (ref 0.44–1.00)
GFR calc Af Amer: >60 mL/min (ref >60)
GFR calc non Af Amer: 57 mL/min — ABNORMAL LOW (ref >60)
Glucose, Bld: 151 mg/dL — ABNORMAL HIGH (ref 70–99)
Potassium: 4.9 mmol/L (ref 3.5–5.1)
Sodium: 136 mmol/L (ref 135–145)

## 2020-01-15 MED ORDER — CLINDAMYCIN HCL 300 MG PO CAPS: 300.0000 mg | ORAL_CAPSULE | Freq: Three times a day (TID) | ORAL | 0 refills | Status: AC

## 2020-01-15 MED ORDER — HYDROMORPHONE HCL 2 MG PO TABS: 2.0000 mg | ORAL_TABLET | ORAL | 0 refills | Status: DC | PRN

## 2020-01-15 MED ORDER — SILVER SULFADIAZINE 1 % EX CREA: TOPICAL_CREAM | CUTANEOUS | 0 refills | Status: DC

## 2020-01-15 NOTE — Progress Notes (Signed)
  Subjective:  Patient ID: Katherine Herrera, female    DOB: Jan 09, 1966,  MRN: [id number]  Patient seen bedside. No overnight events pain improving only had one episode where the pain woke her up.  Objective:   Vitals:   01/14/20 2044 01/15/20 0619  BP: 113/60 111/66  Pulse: 65 64  Resp: 17 14  Temp: 98.5 F (36.9 C) 98 F (36.7 C)  SpO2: 96% 94%   General AA&O x3. Normal mood and affect.  Vascular Dorsalis pedis and posterior tibial pulses 2/4 bilat. Brisk capillary refill to all digits. Pedal hair present.  Neurologic Epicritic sensation grossly intact.  Dermatologic Right leg cellulitis, blistering noted. Cellulitis almost fully resolved.  Orthopedic: MMT 5/5 in dorsiflexion, plantarflexion, inversion, and eversion. Normal joint ROM without pain or crepitus.   Assessment & Plan:  Patient was evaluated and treated and all questions answered.  Cellulitis R Leg -Wound care: silvadene to blisters and plantar wound, DSD -Cellulitis improved sufficiently to be ok for d/c today. -Sent medications for D/c - Dilaudid for pain control, clindamycin 300 TID, silvadene for wound. -Patient to f/u in the office Friday. To call [phone] for appointment.  Evelina Bucy, DPM  Accessible via secure chat for questions or concerns.

## 2020-01-15 NOTE — Discharge Summary (Signed)
Physician Discharge Summary  CHARISSE Herrera IWP:[id number] DOB: 1966/05/27 DOA: 01/10/2020  PCP: Cassandria Anger, MD  Admit date: 01/10/2020 Discharge date: 01/15/2020  Admitted From: Home Disposition: Home   Recommendations for Outpatient Follow-up:  1. Follow up with PCP in 1-2 weeks 2. Please obtain BMP/CBC in one week  Home Health: None new Equipment/Devices: None new; wound care supplies provided Discharge Condition: Stable CODE STATUS: Full Diet recommendation: Heart healthy, carb-modified  Brief/Interim Summary: Katherine Herrera is a 54 year old female with a history of poorly controlled T2DM with peripheral neuropathy, COPD, HLD, hypothyroidism, chronic pain with chronic right foot ulcer admitted to the hospital for right lower extremity cellulitis. Initially was in septic shock on previous admission and then discharged home.  But due to worsening of the cellulitis she was sent back to the hospital. Podiatry assisted with care and the patient improved greatly on antibiotics. She will discharge in stable condition to complete a course of antibiotics and continued wound care per podiatry recommendations, with plans for follow up in 48 hours.   Discharge Diagnoses:  Principal Problem:   Cellulitis of right lower leg Active Problems:   Hypothyroidism   Dyslipidemia   TOBACCO USE DISORDER/SMOKER-SMOKING CESSATION DISCUSSED   Polyneuropathy due to type 2 diabetes mellitus (HCC)   Capsulitis of metatarsophalangeal (MTP) joint of right foot  Right lower extremity cellulitis with chronic right foot wound complicated by diabetes mellitus type 2 with peripheral neuropathy. No DVT on LE dopplers.  - Sustained improvement on vancomycin and cefepime, will continue clindamycin po prescribed by podiatry.  - Continue wound care as reviewed with patient prior to discharge, silvadene sent to pharmacy.  - Continue insulin and gabapentin, recommend PCP follow up for improved  control.  Hyperkalemia: With slight hemolysis noted. Resolved on recheck on day of discharge.   Hypothyroidism -Synthroid  Hyperlipidemia -Zetia  Anxiety disorder   -Stable  Tobacco use History of COPD - Tobacco cessation counseling was provided. Continue home BDs.  History of chronic pain -On home regimen  Discharge Instructions  Allergies as of 01/15/2020      Reactions   Hydromorphone Hcl Itching   Patient has been tolerating Hydromorphone tablets without adverse effect (07/09/19)   Cephalexin Nausea And Vomiting   Demerol  [meperidine Hcl] Rash   Lovastatin Other (See Comments)   Other reaction(s): Unknown (See Comments) Possible myalgia Unknown    Metformin And Related Nausea And Vomiting   Sulfamethoxazole-trimethoprim    Other reaction(s): Unknown DILI, pancreatitis   Crestor [rosuvastatin Calcium]    myalgia   Niacin Other (See Comments)   Unknown    Paroxetine Other (See Comments)   Other reaction(s): Unknown (See Comments) unknown Unknown       Medication List    STOP taking these medications   cefdinir 300 MG capsule Commonly known as: OMNICEF   oxyCODONE 15 MG immediate release tablet Commonly known as: ROXICODONE     TAKE these medications   accu-chek softclix lancets Test two times daily   aspirin 325 MG tablet Commonly known as: Bayer Aspirin Take 1 tablet (325 mg total) by mouth daily.   B-D INS SYR ULTRAFINE 1CC/31G 31G X 5/16" 1 ML Misc Generic drug: Insulin Syringe-Needle U-100 USE AS DIRECTED   Insulin Syringe-Needle U-100 31G X 5/16" 0.5 ML Misc Commonly known as: Sure Comfort Insulin Syringe USE AS DIRECTED WITH INSULIN 4 TIMES A DAY   baclofen 10 MG tablet Commonly known as: LIORESAL Take 2 tablets (20 mg total) by mouth  2 (two) times daily.   bisacodyl 5 MG EC tablet Commonly known as: DULCOLAX Take 10 mg by mouth at bedtime.   cholecalciferol 25 MCG (1000 UNIT) tablet Commonly known as: VITAMIN D Take 2  tablets (2,000 Units total) by mouth daily. What changed: how much to take   clindamycin 300 MG capsule Commonly known as: CLEOCIN Take 1 capsule (300 mg total) by mouth 3 (three) times daily for 10 days.   clonazePAM 0.5 MG tablet Commonly known as: KLONOPIN Take 1 tablet (0.5 mg total) by mouth 2 (two) times daily as needed for anxiety.   ezetimibe 10 MG tablet Commonly known as: ZETIA Take 1 tablet (10 mg total) by mouth daily.   gabapentin 800 MG tablet Commonly known as: NEURONTIN TAKE 1 TABLET BY MOUTH 4  TIMES DAILY What changed: See the new instructions.   glucose blood test strip Commonly known as: OneTouch Verio Use to check blood sugar 2 times per day.   HYDROmorphone 2 MG tablet Commonly known as: Dilaudid Take 1 tablet (2 mg total) by mouth every 4 (four) hours as needed for severe pain.   insulin NPH-regular Human (70-30) 100 UNIT/ML injection Inject 10 Units into the skin 2 (two) times daily with a meal. What changed:   how much to take  additional instructions   lamoTRIgine 25 MG tablet Commonly known as: LAMICTAL TAKE 1 TABLET BY MOUTH TWO  TIMES DAILY   levothyroxine 75 MCG tablet Commonly known as: SYNTHROID TAKE 1 TABLET BY MOUTH  DAILY   loratadine 10 MG tablet Commonly known as: CLARITIN Take 10 mg by mouth daily as needed for allergies.   mupirocin ointment 2 % Commonly known as: BACTROBAN Apply 1 application topically daily as needed (cellulitis).   OneTouch Delica Plus [id number] Misc   OneTouch Verio w/Device Kit   rOPINIRole 0.5 MG tablet Commonly known as: REQUIP TAKE 1 TABLET BY MOUTH AT  BEDTIME   senna-docusate 8.6-50 MG tablet Commonly known as: Senokot-S Take 1 tablet by mouth 2 (two) times daily. What changed: when to take this   silver sulfADIAZINE 1 % cream Commonly known as: Silvadene Apply pea-sized amount to wound daily.   vitamin B-12 1000 MCG tablet Commonly known as: CYANOCOBALAMIN Take 1,000 mcg by mouth  daily.       Allergies  Allergen Reactions  . Hydromorphone Hcl Itching    Patient has been tolerating Hydromorphone tablets without adverse effect (07/09/19)  . Cephalexin Nausea And Vomiting  . Demerol  [Meperidine Hcl] Rash  . Lovastatin Other (See Comments)    Other reaction(s): Unknown (See Comments) Possible myalgia Unknown   . Metformin And Related Nausea And Vomiting  . Sulfamethoxazole-Trimethoprim     Other reaction(s): Unknown DILI, pancreatitis  . Crestor [Rosuvastatin Calcium]     myalgia  . Niacin Other (See Comments)    Unknown   . Paroxetine Other (See Comments)    Other reaction(s): Unknown (See Comments) unknown Unknown     Consultations:  Podiatry, Dr. Hardie Pulley  Procedures/Studies: VAS [place] LOWER EXTREMITY VENOUS (DVT)  Result Date: 01/13/2020  Lower Venous DVTStudy Indications: Swelling, Edema, and ulceration. Other Indications: History of right leg cellulitis. Anticoagulation: Lovenox. Limitations: Body habitus, poor ultrasound/tissue interface and bandages. Comparison Study: LEV 07-06-19, negative. Performing Technologist: Baldwin Crown ARDMS, RVT  Examination Guidelines: A complete evaluation includes B-mode imaging, spectral Doppler, color Doppler, and power Doppler as needed of all accessible portions of each vessel. Bilateral testing is considered an integral part of a  complete examination. Limited examinations for reoccurring indications may be performed as noted. The reflux portion of the exam is performed with the patient in reverse Trendelenburg.  +---------+---------------+---------+-----------+----------+------------------+ RIGHT    CompressibilityPhasicitySpontaneityPropertiesThrombus Aging     +---------+---------------+---------+-----------+----------+------------------+ CFV      Full           Yes      Yes                                     +---------+---------------+---------+-----------+----------+------------------+ SFJ       Full                                                            +---------+---------------+---------+-----------+----------+------------------+ FV Prox  Full                                                            +---------+---------------+---------+-----------+----------+------------------+ FV Mid   Full                                                            +---------+---------------+---------+-----------+----------+------------------+ FV DistalFull                                                            +---------+---------------+---------+-----------+----------+------------------+ PFV      Full                                                            +---------+---------------+---------+-----------+----------+------------------+ POP      Full           Yes      Yes                                     +---------+---------------+---------+-----------+----------+------------------+ PTV      Full                                         visualized with                                                          color              +---------+---------------+---------+-----------+----------+------------------+  PERO                                                  visualized with                                                          color              +---------+---------------+---------+-----------+----------+------------------+   +----+---------------+---------+-----------+----------+--------------+ LEFTCompressibilityPhasicitySpontaneityPropertiesThrombus Aging +----+---------------+---------+-----------+----------+--------------+ CFV Full           Yes      Yes                                 +----+---------------+---------+-----------+----------+--------------+ Poorly visualized right calf veins due to edema. Right mid calf veins not visualized due to bandage and open wounds.  Summary: RIGHT: - There is no evidence of  deep vein thrombosis in the lower extremity. However, portions of this examination were limited- see technologist comments above.  - No cystic structure found in the popliteal fossa.  LEFT: - No evidence of common femoral vein obstruction.  *See table(s) above for measurements and observations. Electronically signed by Ruta Hinds MD on 01/13/2020 at 7:16:49 PM.    Final      Subjective: Pain is controlled, feels comfortable with wound care recommendations. No fevers, redness and warmth subjectively improved.   Discharge Exam: Vitals:   01/14/20 2044 01/15/20 0619  BP: 113/60 111/66  Pulse: 65 64  Resp: 17 14  Temp: 98.5 F (36.9 C) 98 F (36.7 C)  SpO2: 96% 94%   General: Pt is alert, awake, not in acute distress Cardiovascular: RRR, S1/S2 +, no rubs, no gallops Respiratory: CTA bilaterally, no wheezing, no rhonchi Abdominal: Soft, NT, ND, bowel sounds + Extremities: RLE with no significant erythema, with attention to upper shin demarcation with no fluctuance or induration. Laterally healing eschars and plantar/lateral ulcer without exudate tenderness or fluctuance.  Labs: BNP (last 3 results) No results for input(s): BNP in the last 8760 hours. Basic Metabolic Panel: Recent Labs  Lab 01/10/20 2319 01/11/20 0049 01/11/20 0544 01/14/20 0633 01/15/20 0851  NA 131*  --  136 136 136  K 5.0  --  4.7 6.0* 4.9  CL 92*  --  99 106 102  CO2 31  --  28 21* 27  GLUCOSE 503* 464* 136* 109* 151*  BUN 19  --  20 22* 21*  CREATININE 1.18*  --  1.09* 1.11* 1.10*  CALCIUM 9.0  --  8.9 8.9 8.8*   Liver Function Tests: Recent Labs  Lab 01/11/20 0544  AST 18  ALT 14  ALKPHOS 88  BILITOT 1.0  PROT 7.4  ALBUMIN 2.7*   No results for input(s): LIPASE, AMYLASE in the last 168 hours. No results for input(s): AMMONIA in the last 168 hours. CBC: Recent Labs  Lab 01/10/20 2319 01/11/20 0544 01/14/20 0633  WBC 8.4 9.5 7.5  NEUTROABS 4.6  --   --   HGB 13.0 12.2 12.7  HCT 41.2  39.2 40.9  MCV 88.6 88.7 88.9  PLT 538* 507* 425*   Cardiac Enzymes: No results  for input(s): CKTOTAL, CKMB, CKMBINDEX, TROPONINI in the last 168 hours. BNP: Invalid input(s): POCBNP CBG: Recent Labs  Lab 01/14/20 1204 01/14/20 1719 01/14/20 2042 01/15/20 0744 01/15/20 1205  GLUCAP 282* 189* 206* 132* 158*   D-Dimer No results for input(s): DDIMER in the last 72 hours. Hgb A1c No results for input(s): HGBA1C in the last 72 hours. Lipid Profile No results for input(s): CHOL, HDL, LDLCALC, TRIG, CHOLHDL, LDLDIRECT in the last 72 hours. Thyroid function studies No results for input(s): TSH, T4TOTAL, T3FREE, THYROIDAB in the last 72 hours.  Invalid input(s): FREET3 Anemia work up No results for input(s): VITAMINB12, FOLATE, FERRITIN, TIBC, IRON, RETICCTPCT in the last 72 hours. Urinalysis    Component Value Date/Time   COLORURINE YELLOW 02/20/2019 1330   APPEARANCEUR CLEAR 02/20/2019 1330   LABSPEC 1.010 02/20/2019 1330   PHURINE 5.5 02/20/2019 1330   GLUCOSEU NEGATIVE 02/20/2019 1330   HGBUR NEGATIVE 02/20/2019 1330   BILIRUBINUR NEGATIVE 02/20/2019 1330   KETONESUR NEGATIVE 02/20/2019 1330   PROTEINUR NEGATIVE 09/28/2018 2318   UROBILINOGEN 0.2 02/20/2019 1330   NITRITE NEGATIVE 02/20/2019 1330   LEUKOCYTESUR NEGATIVE 02/20/2019 1330    Microbiology Recent Results (from the past 240 hour(s))  SARS CORONAVIRUS 2 (TAT 6-24 HRS) Nasopharyngeal Nasopharyngeal Swab     Status: None   Collection Time: 01/12/20 12:01 AM   Specimen: Nasopharyngeal Swab  Result Value Ref Range Status   SARS Coronavirus 2 NEGATIVE NEGATIVE Final    Comment: (NOTE) SARS-CoV-2 target nucleic acids are NOT DETECTED. The SARS-CoV-2 RNA is generally detectable in upper and lower respiratory specimens during the acute phase of infection. Negative results do not preclude SARS-CoV-2 infection, do not rule out co-infections with other pathogens, and should not be used as the sole basis for  treatment or other patient management decisions. Negative results must be combined with clinical observations, patient history, and epidemiological information. The expected result is Negative. Fact Sheet for Patients: SugarRoll.be Fact Sheet for Healthcare Providers: https://www.woods-mathews.com/ This test is not yet approved or cleared by the [place] FDA and  has been authorized for detection and/or diagnosis of SARS-CoV-2 by FDA under an Emergency Use Authorization (EUA). This EUA will remain  in effect (meaning this test can be used) for the duration of the COVID-19 declaration under Section 56 4(b)(1) of the Act, 21 U.S.C. section 360bbb-3(b)(1), unless the authorization is terminated or revoked sooner. Performed at [facility], [place] [address], [place], [place] [address]     Time coordinating discharge: Approximately 40 minutes  Patrecia Pour, MD  [facility] 01/15/2020, 12:50 PM

## 2020-01-15 NOTE — TOC Transition Note (Signed)
Transition of Care [facility]) - CM/SW Discharge Note   Patient Details  Name: Katherine Herrera MRN: [id number] Date of Birth: 18-Mar-1966  [facility]) CM/SW Contact:  Lennart Pall, LCSW Phone Number: 01/15/2020, 10:21 AM   Clinical Narrative:   Alerted that pt for d/c today and may need HHRN support.  Discussed with pt and she notes that her daughter has assisted with dressing changes in the past and she feels very confident that she can manage current dressing (she says is simpler than last).  She will be working with RN to video tape the dressing change process and has already spoken with RN about needed dressing change supplies - unit to provide.  No further concerns.    Final next level of care: Home/Self Care Barriers to Discharge: Barriers Resolved   Patient Goals and CMS Choice Patient states their goals for this hospitalization and ongoing recovery are:: To return home with daughter to assist as needed.   Choice offered to / list presented to : NA  Discharge Placement                       Discharge Plan and Services In-house Referral: Clinical Social Work   Post Acute Care Choice: NA                               Social Determinants of Health (SDOH) Interventions     Readmission Risk Interventions Readmission Risk Prevention Plan 01/15/2020  Transportation Screening Complete  PCP or Specialist Appt within 5-7 Days Complete  Home Care Screening Complete  Medication Review (RN CM) Complete  Some recent data might be hidden

## 2020-01-17 ENCOUNTER — Ambulatory Visit (INDEPENDENT_AMBULATORY_CARE_PROVIDER_SITE_OTHER): Payer: Medicare Other | Admitting: Podiatry

## 2020-01-17 ENCOUNTER — Other Ambulatory Visit: Payer: Self-pay

## 2020-01-17 DIAGNOSIS — L97413 Non-pressure chronic ulcer of right heel and midfoot with necrosis of muscle: Secondary | ICD-10-CM

## 2020-01-17 DIAGNOSIS — E08621 Diabetes mellitus due to underlying condition with foot ulcer: Secondary | ICD-10-CM | POA: Diagnosis not present

## 2020-01-17 DIAGNOSIS — E1142 Type 2 diabetes mellitus with diabetic polyneuropathy: Secondary | ICD-10-CM

## 2020-01-21 ENCOUNTER — Encounter: Payer: Self-pay | Admitting: Podiatry

## 2020-01-21 NOTE — Progress Notes (Addendum)
  Subjective:  Patient ID: Katherine Herrera, female    DOB: 03/28/1966,  MRN: [id number]  Chief Complaint  Patient presents with  . Wound Check    Pt states feeling much better with antibiotics. Denies any fever/nausea/vomiting/chills.     54 y.o. female presents for wound care. Hx confirmed with patient.  Patient overall is doing much better with antibiotics.  The cellulitis is resolving.  She has been doing local wound care.  Denies any other acute complaints.  She is known to Dr. March Rummage and will be following back up with Dr. March Rummage. Objective:  Physical Exam: Wound Location: right lateral foot Wound Measurement: 3 cm x 2 cm Wound Base: Granular/Healthy Peri-wound: Macerated, Calloused Exudate: Scant/small amount Serosanguinous exudate wound without warmth, erythema, signs of acute infection    Assessment:   1. 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 DM type 2 with diabetic peripheral neuropathy ([place])      Plan:  Patient was evaluated and treated and all questions answered.  Ulcer Right foot -Dressing applied consisting of medihoney -Offload ulcer with surgical shoe, padding applied to surgical shoe -Wound cleansed and debrided  Procedure: Excisional Debridement of Wound Rationale: Removal of non-viable soft tissue from the wound to promote healing.  Anesthesia: none Pre-Debridement Wound Measurements: overlying hyperkeratosis  Post-Debridement Wound Measurements: 3 cm x 2 cm x 0.3 cm Type of Debridement: Sharp Excisional Tissue Removed: Non-viable soft tissue Depth of Debridement: subcutaneous tissue. Technique: Sharp excisional debridement to bleeding, viable wound base.  Dressing: Dry, sterile, compression dressing. Disposition: Patient tolerated procedure well. Patient to return in 1 week for follow-up.  No follow-ups on file.

## 2020-01-22 DIAGNOSIS — E11621 Type 2 diabetes mellitus with foot ulcer: Secondary | ICD-10-CM | POA: Diagnosis not present

## 2020-01-24 ENCOUNTER — Other Ambulatory Visit: Payer: Self-pay

## 2020-01-24 ENCOUNTER — Other Ambulatory Visit: Payer: Self-pay | Admitting: Internal Medicine

## 2020-01-24 ENCOUNTER — Ambulatory Visit (INDEPENDENT_AMBULATORY_CARE_PROVIDER_SITE_OTHER): Payer: Medicare Other | Admitting: Podiatry

## 2020-01-24 DIAGNOSIS — E08621 Diabetes mellitus due to underlying condition with foot ulcer: Secondary | ICD-10-CM

## 2020-01-24 DIAGNOSIS — L97413 Non-pressure chronic ulcer of right heel and midfoot with necrosis of muscle: Secondary | ICD-10-CM

## 2020-02-07 ENCOUNTER — Ambulatory Visit: Payer: Medicare Other | Admitting: Podiatry

## 2020-02-13 ENCOUNTER — Ambulatory Visit (INDEPENDENT_AMBULATORY_CARE_PROVIDER_SITE_OTHER): Payer: Medicare Other | Admitting: Podiatry

## 2020-02-13 ENCOUNTER — Other Ambulatory Visit: Payer: Self-pay

## 2020-02-13 VITALS — Temp 98.1°F

## 2020-02-13 DIAGNOSIS — L97413 Non-pressure chronic ulcer of right heel and midfoot with necrosis of muscle: Secondary | ICD-10-CM | POA: Diagnosis not present

## 2020-02-13 DIAGNOSIS — E08621 Diabetes mellitus due to underlying condition with foot ulcer: Secondary | ICD-10-CM

## 2020-02-13 MED ORDER — DOXYCYCLINE HYCLATE 100 MG PO TABS: 100.0000 mg | ORAL_TABLET | Freq: Two times a day (BID) | ORAL | 0 refills | Status: DC

## 2020-02-22 DIAGNOSIS — E11621 Type 2 diabetes mellitus with foot ulcer: Secondary | ICD-10-CM | POA: Diagnosis not present

## 2020-02-24 DIAGNOSIS — E1140 Type 2 diabetes mellitus with diabetic neuropathy, unspecified: Secondary | ICD-10-CM | POA: Diagnosis not present

## 2020-02-24 DIAGNOSIS — I10 Essential (primary) hypertension: Secondary | ICD-10-CM | POA: Diagnosis not present

## 2020-02-24 DIAGNOSIS — E7800 Pure hypercholesterolemia, unspecified: Secondary | ICD-10-CM | POA: Diagnosis not present

## 2020-02-24 DIAGNOSIS — E039 Hypothyroidism, unspecified: Secondary | ICD-10-CM | POA: Diagnosis not present

## 2020-02-24 DIAGNOSIS — E1165 Type 2 diabetes mellitus with hyperglycemia: Secondary | ICD-10-CM | POA: Diagnosis not present

## 2020-02-27 ENCOUNTER — Ambulatory Visit (INDEPENDENT_AMBULATORY_CARE_PROVIDER_SITE_OTHER): Payer: Medicare Other | Admitting: Podiatry

## 2020-02-27 ENCOUNTER — Other Ambulatory Visit: Payer: Self-pay

## 2020-02-27 DIAGNOSIS — E08621 Diabetes mellitus due to underlying condition with foot ulcer: Secondary | ICD-10-CM

## 2020-02-27 DIAGNOSIS — L97413 Non-pressure chronic ulcer of right heel and midfoot with necrosis of muscle: Secondary | ICD-10-CM

## 2020-03-12 ENCOUNTER — Ambulatory Visit: Payer: Medicaid Other | Admitting: Podiatry

## 2020-03-13 ENCOUNTER — Telehealth: Payer: Self-pay | Admitting: *Deleted

## 2020-03-13 ENCOUNTER — Ambulatory Visit (INDEPENDENT_AMBULATORY_CARE_PROVIDER_SITE_OTHER): Payer: Medicare Other | Admitting: Podiatry

## 2020-03-13 ENCOUNTER — Other Ambulatory Visit: Payer: Self-pay

## 2020-03-13 ENCOUNTER — Ambulatory Visit (INDEPENDENT_AMBULATORY_CARE_PROVIDER_SITE_OTHER): Payer: Medicare Other

## 2020-03-13 VITALS — Temp 97.1°F

## 2020-03-13 DIAGNOSIS — E08621 Diabetes mellitus due to underlying condition with foot ulcer: Secondary | ICD-10-CM

## 2020-03-13 DIAGNOSIS — M868X7 Other osteomyelitis, ankle and foot: Secondary | ICD-10-CM

## 2020-03-13 DIAGNOSIS — L97413 Non-pressure chronic ulcer of right heel and midfoot with necrosis of muscle: Secondary | ICD-10-CM | POA: Diagnosis not present

## 2020-03-13 MED ORDER — DOXYCYCLINE HYCLATE 100 MG PO TABS: 100.0000 mg | ORAL_TABLET | Freq: Two times a day (BID) | ORAL | 0 refills | Status: DC

## 2020-03-13 NOTE — Telephone Encounter (Signed)
Faxed orders, clinicals and demographics to [facility] for pre-cert and scheduling. 

## 2020-03-13 NOTE — Progress Notes (Signed)
  Subjective:  Patient ID: Katherine Herrera, female    DOB: 01/10/1966,  MRN: [id number]  Chief Complaint  Patient presents with  . Wound Check    R plantar midfoot. Pt stated, "8/10 pain when I walk a lot. I would like to try wearing a short boot instead of the surgical shoe. I've seen pus for a few days. No foul odor/fever/chills/N&V/significantly abnormal glucose. I'm still using silvadene".    54 y.o. female presents for wound care. Hx confirmed with patient.  Objective:  Physical Exam: Wound Location: right 5th met base Wound Measurement: 1x1 Wound Base: Granular/Healthy Peri-wound: Reddened Exudate: None: wound tissue dry appearance is similar to last recheck with some dorsal redness.  No images are attached to the encounter.  Radiographs:  X-ray of the right foot: no ST emphysema, small area of ostoelysis 5th met base, unsure if periosteal reaction from infection or if HO. Assessment:   1. Diabetic ulcer of right midfoot associated with diabetes mellitus due to underlying condition, with necrosis of muscle ([place])      Plan:  Patient was evaluated and treated and all questions answered.  Ulcer right foot -XR reviewed with pt -Order doxycycline for mild redness -Should symptoms persist or worsen proceed right to ED. -Offload ulcer with CAM boot -CAM boot dispensed -Wound cleansed and debrided -Order MRI to r/o Osteomyelitis  Procedure: Selective Debridement of Wound Rationale: Removal of devitalized tissue from the wound to promote healing.  Pre-Debridement Wound Measurements: 1x1 cm x 0.2 cm  Post-Debridement Wound Measurements: same as pre-debridement. Type of Debridement: sharp selective Tissue Removed: Devitalized soft-tissue Dressing: Dry, sterile, compression dressing. Disposition: Patient tolerated procedure well. Patient to return in 1 week for follow-up.   No follow-ups on file.

## 2020-03-19 ENCOUNTER — Other Ambulatory Visit: Payer: Self-pay

## 2020-03-19 ENCOUNTER — Ambulatory Visit (INDEPENDENT_AMBULATORY_CARE_PROVIDER_SITE_OTHER): Payer: Medicare Other | Admitting: Podiatry

## 2020-03-19 DIAGNOSIS — L97413 Non-pressure chronic ulcer of right heel and midfoot with necrosis of muscle: Secondary | ICD-10-CM

## 2020-03-19 DIAGNOSIS — E08621 Diabetes mellitus due to underlying condition with foot ulcer: Secondary | ICD-10-CM

## 2020-03-23 DIAGNOSIS — E11621 Type 2 diabetes mellitus with foot ulcer: Secondary | ICD-10-CM | POA: Diagnosis not present

## 2020-03-26 ENCOUNTER — Other Ambulatory Visit: Payer: Self-pay

## 2020-03-26 ENCOUNTER — Ambulatory Visit (INDEPENDENT_AMBULATORY_CARE_PROVIDER_SITE_OTHER): Payer: Medicare Other | Admitting: Podiatry

## 2020-03-26 VITALS — Temp 97.3°F

## 2020-03-26 DIAGNOSIS — L97413 Non-pressure chronic ulcer of right heel and midfoot with necrosis of muscle: Secondary | ICD-10-CM | POA: Diagnosis not present

## 2020-03-26 DIAGNOSIS — E08621 Diabetes mellitus due to underlying condition with foot ulcer: Secondary | ICD-10-CM

## 2020-03-31 ENCOUNTER — Telehealth: Payer: Self-pay | Admitting: Podiatry

## 2020-03-31 NOTE — Telephone Encounter (Signed)
Pt said Dr. March Rummage ordered an MRI and she hasn't heard back from the imaging center. Please advise.

## 2020-03-31 NOTE — Telephone Encounter (Signed)
I spoke with pt and informed that Katherine Herrera was ready to schedule and she could call [phone].

## 2020-04-06 DIAGNOSIS — I4510 Unspecified right bundle-branch block: Secondary | ICD-10-CM | POA: Diagnosis not present

## 2020-04-06 DIAGNOSIS — R0789 Other chest pain: Secondary | ICD-10-CM | POA: Diagnosis not present

## 2020-04-06 DIAGNOSIS — R110 Nausea: Secondary | ICD-10-CM | POA: Diagnosis not present

## 2020-04-06 DIAGNOSIS — R079 Chest pain, unspecified: Secondary | ICD-10-CM | POA: Diagnosis not present

## 2020-04-06 DIAGNOSIS — R069 Unspecified abnormalities of breathing: Secondary | ICD-10-CM | POA: Diagnosis not present

## 2020-04-08 ENCOUNTER — Ambulatory Visit: Payer: Medicare Other | Admitting: Internal Medicine

## 2020-04-09 ENCOUNTER — Other Ambulatory Visit: Payer: Self-pay

## 2020-04-09 ENCOUNTER — Ambulatory Visit (INDEPENDENT_AMBULATORY_CARE_PROVIDER_SITE_OTHER): Payer: Medicare Other | Admitting: Podiatry

## 2020-04-09 ENCOUNTER — Encounter: Payer: Self-pay | Admitting: Podiatry

## 2020-04-09 DIAGNOSIS — L97413 Non-pressure chronic ulcer of right heel and midfoot with necrosis of muscle: Secondary | ICD-10-CM

## 2020-04-09 DIAGNOSIS — E08621 Diabetes mellitus due to underlying condition with foot ulcer: Secondary | ICD-10-CM | POA: Diagnosis not present

## 2020-04-09 NOTE — Progress Notes (Signed)
  Subjective:  Patient ID: Katherine Herrera, female    DOB: 02-17-1966,  MRN: [id number]  Chief Complaint  Patient presents with  . Wound Check    Pt doing well, states no reddness, swelling went down, taking pain meds as needed- hydrocodone. Overall doing well     54 y.o. female presents for wound care. Hx confirmed with patient.  Objective:  Physical Exam: Wound Location: right 5th met base Wound Measurement: 1.5x1 Wound Base: Granular/Healthy Peri-wound: Reddened Exudate: None: wound tissue dry appearance is similar to last recheck with some dorsal redness.  Assessment:   1. 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  Ulcer right foot -Pending MRI -Selective debridement today -Plan for possible graft at later visit  Procedure: Selective Debridement of Wound Rationale: Removal of devitalized tissue from the wound to promote healing.  Pre-Debridement Wound Measurements: 1.5 cm x 1 cm x 0.2 cm  Post-Debridement Wound Measurements: same as pre-debridement. Type of Debridement: sharp selective Tissue Removed: Devitalized soft-tissue Dressing: Dry, sterile, compression dressing. Disposition: Patient tolerated procedure well. Patient to return in 1 week for follow-up.    Return in about 3 weeks (around 04/30/2020).

## 2020-04-13 NOTE — Progress Notes (Signed)
  Subjective:  Patient ID: Katherine Herrera, female    DOB: 06-21-1966,  MRN: [id number]  Chief Complaint  Patient presents with  . Wound Check    Right lower extremity swelling as returned 2-3 days ago. Pt denies fever/chills/nausea/vomiting.     54 y.o. female presents for wound care. Hx confirmed with patient.  Objective:  Physical Exam: Wound Location: right lateral foot Wound Measurement: 4x2 Wound Base: Granular/Healthy Peri-wound: Macerated, Calloused Exudate: Scant/small amount Serosanguinous exudate wound with mild periwound erythema, signs of acute infection Assessment:   1. Diabetic ulcer of right midfoot associated with diabetes mellitus due to underlying condition, with necrosis of muscle ([place])      Plan:  Patient was evaluated and treated and all questions answered.  Ulcer Right foot -Dressing applied consisting of medihoney -Offload ulcer with surgical shoe, padding applied to surgical shoe -Wound cleansed and debrided -Rx doxycycline for mild periwound erythema.  Procedure: Selective Debridement of Wound Rationale: Removal of devitalized tissue from the wound to promote healing.  Pre-Debridement Wound Measurements: 4 cm x 2 cm x 0.2 cm  Post-Debridement Wound Measurements: same as pre-debridement. Type of Debridement: sharp selective Tissue Removed: Devitalized soft-tissue Dressing: Dry, sterile, compression dressing. Disposition: Patient tolerated procedure well. Patient to return in 1 week for follow-up.    Return in about 2 weeks (around 02/27/2020) for Wound Care.

## 2020-04-16 ENCOUNTER — Ambulatory Visit: Payer: Medicare Other | Admitting: Podiatry

## 2020-04-17 ENCOUNTER — Telehealth: Payer: Self-pay | Admitting: Podiatry

## 2020-04-17 NOTE — Telephone Encounter (Signed)
Katherine Herrera  Medical received fax for chart notes but pages [phone] somehow did not come thru please resend  [phone]

## 2020-04-20 ENCOUNTER — Telehealth: Payer: Self-pay | Admitting: Podiatry

## 2020-04-20 NOTE — Telephone Encounter (Signed)
Katherine Herrera from [facility] called and wanted the pg [phone] sent to fax# [phone] please

## 2020-04-21 ENCOUNTER — Ambulatory Visit (INDEPENDENT_AMBULATORY_CARE_PROVIDER_SITE_OTHER): Payer: Medicare Other | Admitting: Internal Medicine

## 2020-04-21 ENCOUNTER — Other Ambulatory Visit: Payer: Self-pay

## 2020-04-21 ENCOUNTER — Ambulatory Visit: Payer: Medicare Other

## 2020-04-21 ENCOUNTER — Encounter: Payer: Self-pay | Admitting: Internal Medicine

## 2020-04-21 DIAGNOSIS — E11621 Type 2 diabetes mellitus with foot ulcer: Secondary | ICD-10-CM

## 2020-04-21 DIAGNOSIS — L0390 Cellulitis, unspecified: Secondary | ICD-10-CM | POA: Diagnosis not present

## 2020-04-21 DIAGNOSIS — F419 Anxiety disorder, unspecified: Secondary | ICD-10-CM

## 2020-04-21 DIAGNOSIS — E538 Deficiency of other specified B group vitamins: Secondary | ICD-10-CM

## 2020-04-21 DIAGNOSIS — M5440 Lumbago with sciatica, unspecified side: Secondary | ICD-10-CM

## 2020-04-21 DIAGNOSIS — A419 Sepsis, unspecified organism: Secondary | ICD-10-CM

## 2020-04-21 DIAGNOSIS — L97413 Non-pressure chronic ulcer of right heel and midfoot with necrosis of muscle: Secondary | ICD-10-CM

## 2020-04-21 MED ORDER — CLONAZEPAM 0.5 MG PO TABS: 0.5000 mg | ORAL_TABLET | Freq: Two times a day (BID) | ORAL | 1 refills | Status: DC | PRN

## 2020-04-21 MED ORDER — FLUTICASONE PROPIONATE 50 MCG/ACT NA SUSP
2.0000 | Freq: Every day | NASAL | 6 refills | Status: DC
Start: 2020-04-21 — End: 2021-11-29

## 2020-04-21 MED ORDER — LORATADINE 10 MG PO TABS: 10.0000 mg | ORAL_TABLET | Freq: Every day | ORAL | 3 refills | Status: DC | PRN

## 2020-04-21 MED ORDER — OXYCODONE HCL 15 MG PO TABS: 15.0000 mg | ORAL_TABLET | Freq: Four times a day (QID) | ORAL | 0 refills | Status: DC | PRN

## 2020-04-21 NOTE — Assessment & Plan Note (Signed)
The pt had sepsis in [place] 3-4 mo ago

## 2020-04-21 NOTE — Progress Notes (Signed)
Subjective:  Patient ID: Katherine Herrera, female    DOB: 1966/09/11  Age: 54 y.o. MRN: [id number]  CC: No chief complaint on file.   HPI Katherine Herrera presents for chronic pain, DM, HTN, anxiety f/u The pt had sepsis in [place] 3-4 mo ago  Outpatient Medications Prior to Visit  Medication Sig Dispense Refill  . aspirin (BAYER ASPIRIN) 325 MG tablet Take 1 tablet (325 mg total) by mouth daily. 100 tablet 3  . B-D INS SYR ULTRAFINE 1CC/31G 31G X 5/16" 1 ML MISC USE AS DIRECTED 100 each 0  . baclofen (LIORESAL) 10 MG tablet Take 2 tablets (20 mg total) by mouth 2 (two) times daily. 180 tablet 1  . bisacodyl (DULCOLAX) 5 MG EC tablet Take 10 mg by mouth at bedtime.    . Blood Glucose Monitoring Suppl (ONETOUCH VERIO) w/Device KIT     . cholecalciferol (VITAMIN D) 25 MCG (1000 UT) tablet Take 2 tablets (2,000 Units total) by mouth daily. (Patient taking differently: Take 1,000 Units by mouth daily. ) 100 tablet 3  . clonazePAM (KLONOPIN) 0.5 MG tablet Take 1 tablet (0.5 mg total) by mouth 2 (two) times daily as needed for anxiety. 30 tablet 1  . ezetimibe (ZETIA) 10 MG tablet TAKE 1 TABLET BY MOUTH  DAILY 90 tablet 3  . gabapentin (NEURONTIN) 800 MG tablet TAKE 1 TABLET BY MOUTH 4  TIMES DAILY (Patient taking differently: Take 800 mg by mouth in the morning, at noon, in the evening, and at bedtime. ) 360 tablet 3  . glucose blood (ONETOUCH VERIO) test strip Use to check blood sugar 2 times per day. 100 each 12  . HYDROmorphone (DILAUDID) 2 MG tablet Take 1 tablet (2 mg total) by mouth every 4 (four) hours as needed for severe pain. 20 tablet 0  . insulin NPH-regular Human (70-30) 100 UNIT/ML injection Inject 10 Units into the skin 2 (two) times daily with a meal. (Patient taking differently: Inject 40 Units into the skin 2 (two) times daily with a meal. Blood Sugar>200 Take 40-50 units BID) 10 mL 11  . Insulin Syringe-Needle U-100 (SURE COMFORT INSULIN SYRINGE) 31G X 5/16" 0.5 ML MISC USE AS  DIRECTED WITH INSULIN 4 TIMES A DAY 400 each 2  . lamoTRIgine (LAMICTAL) 25 MG tablet TAKE 1 TABLET BY MOUTH TWO  TIMES DAILY 180 tablet 3  . Lancet Devices (ACCU-CHEK SOFTCLIX) lancets Test two times daily 100 each 12  . Lancets (ONETOUCH DELICA PLUS [id number]) MISC     . levothyroxine (SYNTHROID) 75 MCG tablet TAKE 1 TABLET BY MOUTH  DAILY 90 tablet 3  . lisinopril (ZESTRIL) 10 MG tablet TAKE 1 TABLET BY MOUTH  DAILY 90 tablet 3  . loratadine (CLARITIN) 10 MG tablet Take 10 mg by mouth daily as needed for allergies.    . mupirocin ointment (BACTROBAN) 2 % Apply 1 application topically daily as needed (cellulitis).     Marland Kitchen oxyCODONE (ROXICODONE) 15 MG immediate release tablet Take 15 mg by mouth 4 (four) times daily as needed.    Marland Kitchen rOPINIRole (REQUIP) 0.5 MG tablet TAKE 1 TABLET BY MOUTH AT  BEDTIME 90 tablet 3  . senna-docusate (SENOKOT-S) 8.6-50 MG tablet Take 1 tablet by mouth 2 (two) times daily. (Patient taking differently: Take 1 tablet by mouth at bedtime. ) 20 tablet 0  . silver sulfADIAZINE (SILVADENE) 1 % cream Apply pea-sized amount to wound daily. 50 g 0  . vitamin B-12 (CYANOCOBALAMIN) 1000 MCG tablet Take 1,000  mcg by mouth daily.       No facility-administered medications prior to visit.    ROS: Review of Systems  Constitutional: Negative for activity change, appetite change, chills, diaphoresis, fatigue, fever and unexpected weight change.  HENT: Negative for congestion, dental problem, ear pain, hearing loss, mouth sores, postnasal drip, sinus pressure, sneezing, sore throat and voice change.   Eyes: Negative for pain and visual disturbance.  Respiratory: Negative for cough, chest tightness, wheezing and stridor.   Cardiovascular: Negative for chest pain, palpitations and leg swelling.  Gastrointestinal: Negative for abdominal distention, abdominal pain, blood in stool, nausea, rectal pain and vomiting.  Genitourinary: Negative for decreased urine volume, difficulty  urinating, dysuria, frequency, hematuria, menstrual problem, vaginal bleeding, vaginal discharge and vaginal pain.  Musculoskeletal: Positive for arthralgias and gait problem. Negative for back pain, joint swelling and neck pain.  Skin: Negative for color change, rash and wound.  Neurological: Negative for dizziness, tremors, syncope, speech difficulty, weakness and light-headedness.  Hematological: Negative for adenopathy.  Psychiatric/Behavioral: Negative for behavioral problems, confusion, decreased concentration, dysphoric mood, hallucinations, sleep disturbance and suicidal ideas. The patient is not nervous/anxious and is not hyperactive.     Objective:  BP 110/60 (BP Location: Right Arm, Patient Position: Sitting, Cuff Size: Large)   Pulse 75   Temp 98.4 F (36.9 C) (Oral)   Ht 5' 9.5" (1.765 m)   Wt 215 lb (97.5 kg)   SpO2 97%   BMI 31.29 kg/m   BP Readings from Last 3 Encounters:  04/21/20 110/60  01/15/20 130/73  01/10/20 114/71    Wt Readings from Last 3 Encounters:  04/21/20 215 lb (97.5 kg)  01/10/20 205 lb 11 oz (93.3 kg)  09/02/19 222 lb (100.7 kg)    Physical Exam Constitutional:      General: She is not in acute distress.    Appearance: She is well-developed. She is obese.  HENT:     Head: Normocephalic.     Right Ear: External ear normal.     Left Ear: External ear normal.     Nose: Nose normal.  Eyes:     General:        Right eye: No discharge.        Left eye: No discharge.     Conjunctiva/sclera: Conjunctivae normal.     Pupils: Pupils are equal, round, and reactive to light.  Neck:     Thyroid: No thyromegaly.     Vascular: No JVD.     Trachea: No tracheal deviation.  Cardiovascular:     Rate and Rhythm: Normal rate and regular rhythm.     Heart sounds: Normal heart sounds.  Pulmonary:     Effort: No respiratory distress.     Breath sounds: No stridor. No wheezing.  Abdominal:     General: Bowel sounds are normal. There is no distension.       Palpations: Abdomen is soft. There is no mass.     Tenderness: There is no abdominal tenderness. There is no guarding or rebound.  Musculoskeletal:        General: Tenderness present.     Cervical back: Normal range of motion and neck supple.  Lymphadenopathy:     Cervical: No cervical adenopathy.  Skin:    Findings: No erythema or rash.  Neurological:     Mental Status: She is oriented to person, place, and time.     Cranial Nerves: No cranial nerve deficit.     Motor: No abnormal muscle tone.  Coordination: Coordination abnormal.     Gait: Gait abnormal.     Deep Tendon Reflexes: Reflexes normal.  Psychiatric:        Behavior: Behavior normal.        Thought Content: Thought content normal.        Judgment: Judgment normal.   surg shoe L surg boot R  Lab Results  Component Value Date   WBC 7.5 01/14/2020   HGB 12.7 01/14/2020   HCT 40.9 01/14/2020   PLT 425 (H) 01/14/2020   GLUCOSE 151 (H) 01/15/2020   CHOL 223 (H) 07/19/2016   TRIG (H) 07/19/2016    527.0 Triglyceride is over 400; calculations on Lipids are invalid.   HDL 27.40 (L) 07/19/2016   LDLDIRECT 106.0 07/19/2016   LDLCALC 81 01/02/2013   ALT 14 01/11/2020   AST 18 01/11/2020   NA 136 01/15/2020   K 4.9 01/15/2020   CL 102 01/15/2020   CREATININE 1.10 (H) 01/15/2020   BUN 21 (H) 01/15/2020   CO2 27 01/15/2020   TSH 1.575 07/20/2019   INR 1.0 07/19/2019   HGBA1C 9.8 (H) 01/10/2020   MICROALBUR 0.9 07/19/2016    No results found.  Assessment & Plan:    Walker Kehr, MD

## 2020-04-21 NOTE — Assessment & Plan Note (Signed)
On Oxycodone 15 mg  Potential benefits of a long term opioids use as well as potential risks (i.e. addiction risk, apnea etc) and complications (i.e. Somnolence, constipation and others) were explained to the patient and were aknowledged. Risks of use w/benzodiazepines discussed  Do not take w/Clonazepam

## 2020-04-21 NOTE — Assessment & Plan Note (Signed)
On B12 

## 2020-04-21 NOTE — Assessment & Plan Note (Signed)
Clonazepam prn  Potential benefits of a long term benzodiazepines  use as well as potential risks  and complications were explained to the patient and were aknowledged.  

## 2020-04-21 NOTE — Assessment & Plan Note (Signed)
The pt had sepsis in [place] 3-4 mo ago

## 2020-04-23 ENCOUNTER — Ambulatory Visit: Payer: Medicare Other

## 2020-04-23 DIAGNOSIS — E11621 Type 2 diabetes mellitus with foot ulcer: Secondary | ICD-10-CM | POA: Diagnosis not present

## 2020-04-30 ENCOUNTER — Ambulatory Visit (INDEPENDENT_AMBULATORY_CARE_PROVIDER_SITE_OTHER): Payer: Medicare Other | Admitting: Podiatry

## 2020-04-30 ENCOUNTER — Other Ambulatory Visit: Payer: Self-pay

## 2020-04-30 VITALS — Temp 96.4°F

## 2020-04-30 DIAGNOSIS — L97413 Non-pressure chronic ulcer of right heel and midfoot with necrosis of muscle: Secondary | ICD-10-CM | POA: Diagnosis not present

## 2020-04-30 DIAGNOSIS — E08621 Diabetes mellitus due to underlying condition with foot ulcer: Secondary | ICD-10-CM | POA: Diagnosis not present

## 2020-04-30 MED ORDER — ALPRAZOLAM 0.5 MG PO TABS
ORAL_TABLET | ORAL | 0 refills | Status: DC
Start: 2020-04-30 — End: 2020-05-26

## 2020-04-30 NOTE — Progress Notes (Signed)
  Subjective:  Patient ID: Katherine Herrera, female    DOB: [birth year],  MRN: [id number]  Chief Complaint  Patient presents with  . Wound Check    R plantar midfoot. Pt stated, "More pain in these past 3-4 days - 10/10 at times. Increased clear drainage x3-4 days. No pus/foul odor/redness/warmth/fever/chills/N&V. Using Silvadene".    54 y.o. female presents for wound care. Hx confirmed with patient.  Objective:  Physical Exam: Wound Location: right 5th met base Wound Measurement: 1.5x1 Wound Base: Granular/Healthy Peri-wound: Reddened Exudate: None: wound tissue dry appearance is similar to last recheck with some dorsal redness.  Assessment:   1. Diabetic ulcer of right midfoot associated with diabetes mellitus due to underlying condition, with necrosis of muscle ([place])      Plan:  Patient was evaluated and treated and all questions answered.  Ulcer right foot -Pending MRI -Selective debridement today -Dressed with silvadene and mepilex border dressing  Procedure: Selective Debridement of Wound Rationale: Removal of devitalized tissue from the wound to promote healing.  Pre-Debridement Wound Measurements: 1.5 cm x 1 cm x 0.1 cm  Post-Debridement Wound Measurements: same as pre-debridement. Type of Debridement: sharp selective Tissue Removed: Devitalized soft-tissue Dressing: Dry, sterile, compression dressing. Disposition: Patient tolerated procedure well. Patient to return in 1 week for follow-up.    No follow-ups on file.

## 2020-05-02 ENCOUNTER — Other Ambulatory Visit: Payer: Self-pay

## 2020-05-02 ENCOUNTER — Ambulatory Visit
Admission: RE | Admit: 2020-05-02 | Discharge: 2020-05-02 | Disposition: A | Discharge status: Home / Self Care | Payer: Medicare Other | Source: Ambulatory Visit | Attending: Podiatry | Admitting: Podiatry

## 2020-05-02 DIAGNOSIS — S91301A Unspecified open wound, right foot, initial encounter: Secondary | ICD-10-CM | POA: Diagnosis not present

## 2020-05-02 DIAGNOSIS — L97519 Non-pressure chronic ulcer of other part of right foot with unspecified severity: Secondary | ICD-10-CM | POA: Diagnosis not present

## 2020-05-02 DIAGNOSIS — R600 Localized edema: Secondary | ICD-10-CM | POA: Diagnosis not present

## 2020-05-02 DIAGNOSIS — M868X7 Other osteomyelitis, ankle and foot: Secondary | ICD-10-CM

## 2020-05-02 NOTE — Progress Notes (Signed)
  Subjective:  Patient ID: Katherine Herrera, female    DOB: 12-21-1965,  MRN: [id number]  Chief Complaint  Patient presents with  . Wound Check    Pt states finished Doxycycline 2 days ago and no longer has any drainage. Denies fever/chills/nausea/vomiting.     54 y.o. female presents for wound care. Hx confirmed with patient.  Objective:  Physical Exam: Wound Location: right lateral foot Wound Measurement: 2x2 Wound Base: Granular/Healthy Peri-wound: Macerated, Calloused Exudate: Scant/small amount Serosanguinous exudate wound without periwound erythema, signs of acute infection Assessment:   1. Diabetic ulcer of right midfoot associated with diabetes mellitus due to underlying condition, with necrosis of muscle ([place])    Plan:  Patient was evaluated and treated and all questions answered.  Ulcer Right foot -Dressing applied consisting of medihoney -Offload ulcer with surgical shoe, padding applied to surgical shoe -Wound cleansed and debrided  Procedure: Selective Debridement of Wound Rationale: Removal of devitalized tissue from the wound to promote healing.  Pre-Debridement Wound Measurements: 2 cm x 2 cm x 0.3 cm  Post-Debridement Wound Measurements: same as pre-debridement. Type of Debridement: sharp selective Tissue Removed: Devitalized soft-tissue Dressing: Dry, sterile, compression dressing. Disposition: Patient tolerated procedure well. Patient to return in 1 week for follow-up.    No follow-ups on file.

## 2020-05-02 NOTE — Progress Notes (Signed)
  Subjective:  Patient ID: Katherine Herrera, female    DOB: [birth year],  MRN: [id number]  Chief Complaint  Patient presents with  . Wound Check    Pt states improvement, less redness, still taking antibiotics. Pt states reduction in pain when wearing boot. No new concerns. Denies fever/chills/nausea/vomiting.    54 y.o. female presents for wound care. Hx confirmed with patient.  Objective:  Physical Exam: Wound Location: right 5th met base Wound Measurement: 1x1 Wound Base: Granular/Healthy Peri-wound: Reddened Exudate: None: wound tissue dry appearance is improved to last recheck with less dorsal redness.  Assessment:   1. Diabetic ulcer of right midfoot associated with diabetes mellitus due to underlying condition, with necrosis of muscle ([place])      Plan:  Patient was evaluated and treated and all questions answered.  Ulcer right foot -Should symptoms persist or worsen proceed right to ED. -Offload ulcer with CAM boot -Wound cleansed and debrided -Pending MRI to r/o Osteomyelitis  Procedure: Selective Debridement of Wound Rationale: Removal of devitalized tissue from the wound to promote healing.  Pre-Debridement Wound Measurements: 1 cm x 1 cm x 0.2 cm  Post-Debridement Wound Measurements: same as pre-debridement. Type of Debridement: sharp selective Tissue Removed: Devitalized soft-tissue Dressing: Dry, sterile, compression dressing. Disposition: Patient tolerated procedure well. Patient to return in 1 week for follow-up.    No follow-ups on file.

## 2020-05-03 NOTE — Progress Notes (Signed)
  Subjective:  Patient ID: Katherine Herrera, female    DOB: [birth year],  MRN: [id number]  Chief Complaint  Patient presents with  . Wound Check    Pt states "I think it is infected, there was some odor". Pt denies fever//vomiting/chills. "My stomach was a little upset but I did not vomit".     54 y.o. female presents for wound care. Hx confirmed with patient.  Objective:  Physical Exam: Wound Location: Right lateral foot Wound Measurement: 2x3 post-debridement Wound Base: Granular/Healthy Peri-wound: Calloused Exudate: Scant/small amount Serosanguinous exudate No warmth erythema signs of infection noted today    Assessment:   1. Diabetic ulcer of right midfoot associated with diabetes mellitus due to underlying condition, with necrosis of muscle ([place])      Plan:  Patient was evaluated and treated and all questions answered.  Ulcer Right lateral foot -Dressing applied consisting of medihoney -Offload ulcer with surgical shoe -Wound cleansed and debrided -No need for antibiotic therapy today  Procedure: Selective Debridement of Wound Rationale: Removal of devitalized tissue from the wound to promote healing.  Pre-Debridement Wound Measurements: 2 cm x 3 cm x 0.2 cm  Post-Debridement Wound Measurements: same as pre-debridement. Type of Debridement: sharp selective Tissue Removed: Devitalized soft-tissue Dressing: Dry, sterile, compression dressing. Disposition: Patient tolerated procedure well. Patient to return in 1 week for follow-up.      No follow-ups on file.

## 2020-05-08 ENCOUNTER — Ambulatory Visit: Payer: Medicare Other | Admitting: Podiatry

## 2020-05-08 ENCOUNTER — Other Ambulatory Visit: Payer: Self-pay | Admitting: Internal Medicine

## 2020-05-10 NOTE — Progress Notes (Signed)
  Subjective:  Patient ID: Katherine Herrera, female    DOB: July 08, 1966,  MRN: [id number]  Chief Complaint  Patient presents with  . Wound Check    Pt states "I think the infection has returned" Right lower extremity is swollen, red, painful to touch, warm to touch. Pt states "I have chills sometimes"    54 y.o. female presents for wound care. Hx confirmed with patient.  Objective:  Physical Exam: Wound Location: right lateral foot Wound Measurement: 4.5x3 Wound Base: Granular/Healthy Peri-wound: Macerated, Calloused Exudate: Scant/small amount Serosanguinous exudate wound without warmth, erythema, signs of acute infection  Cellulitis of right leg noted   Assessment:   1. Diabetic ulcer of right midfoot associated with diabetes mellitus due to underlying condition, with necrosis of muscle ([place])   2. Cellulitis and abscess of foot, except toes   3. Skin ulcer of right foot, limited to breakdown of skin [facility])      Plan:  Patient was evaluated and treated and all questions answered.  Ulcer Right foot -Dressing applied consisting of medihoney -Given cellulitis of the leg will direct admit for IV antibiotics.  Direct admission arranged to was alone for cellulitis.  Will follow up with patient in the hospital  No follow-ups on file.

## 2020-05-10 NOTE — Progress Notes (Signed)
  Subjective:  Patient ID: Katherine Herrera, female    DOB: 1965/11/01,  MRN: [id number]  Chief Complaint  Patient presents with  . Wound Check    Right foot wound check. Pt states infection is clearing up and has 2 more days of antibiotics. Denies fever/chils/nausea/vomiting and states she has improvement. No new concerns.    54 y.o. female presents for wound care. Hx confirmed with patient.  Objective:  Physical Exam: Wound Location: right lateral foot Wound Measurement: 4 x2 Wound Base: Granular/Healthy Peri-wound: Macerated, Calloused Exudate: Scant/small amount Serosanguinous exudate wound without warmth, erythema, signs of acute infection  Cellulitis of leg resolving   Assessment:   1. Diabetic ulcer of right midfoot associated with diabetes mellitus due to underlying condition, with necrosis of muscle ([place])      Plan:  Patient was evaluated and treated and all questions answered.  Ulcer Right foot -Dressing applied consisting of medihoney -Offload ulcer with surgical shoe, padding applied to surgical shoe -Wound cleansed and debrided  Procedure: Selective Debridement of Wound Rationale: Removal of devitalized tissue from the wound to promote healing.  Pre-Debridement Wound Measurements:  4 cm x 2 cm x 0.2 cm  Post-Debridement Wound Measurements: same as pre-debridement. Type of Debridement: sharp selective Tissue Removed: Devitalized soft-tissue Dressing: Dry, sterile, compression dressing. Disposition: Patient tolerated procedure well. Patient to return in 1 week for follow-up.    No follow-ups on file.

## 2020-05-11 ENCOUNTER — Ambulatory Visit: Payer: Medicare Other | Admitting: Podiatry

## 2020-05-13 NOTE — Progress Notes (Signed)
Subjective:  Patient ID: Katherine Herrera, female    DOB: 1966/06/24,  MRN: [id number]  Chief Complaint  Patient presents with  . Wound Check    Pt states tenderness/redness/swelling. Denies drainage. Denies fever/nausea/vomiting/chills.   54 y.o. female returns for post-op check.  History above confirmed with patient  Review of Systems: Negative except as noted in the HPI. Denies N/V/F/Ch.  Past Medical History:  Diagnosis Date  . Anxiety   . COPD (chronic obstructive pulmonary disease) ([place])   . DM (diabetes mellitus) ([place])    type 2  . Family history of adverse reaction to anesthesia    mother hard to wake up  . Gout    possible  . Hyperlipidemia   . Hypothyroidism   . LBP (low back pain)    DR Patrice [place]  . Migraine   . Peripheral neuropathy   . Syncope    poss conversion disorder vs psychogenic seizures  . Tachycardia    diet coke overuse?  Marland Kitchen Tenosynovitis of foot and ankle 09/03/2013  . TTS (tarsal tunnel syndrome) 2010   Left Foot [facility] in [place]  . Vitamin D deficiency   . Wound, open    both feet outer parts no drainage, changes dressing 1 day, wounds dime size for last month    Current Outpatient Medications:  .  aspirin (BAYER ASPIRIN) 325 MG tablet, Take 1 tablet (325 mg total) by mouth daily., Disp: 100 tablet, Rfl: 3 .  B-D INS SYR ULTRAFINE 1CC/31G 31G X 5/16" 1 ML MISC, USE AS DIRECTED, Disp: 100 each, Rfl: 0 .  Blood Glucose Monitoring Suppl (ONETOUCH VERIO) w/Device KIT, , Disp: , Rfl:  .  cholecalciferol (VITAMIN D) 25 MCG (1000 UT) tablet, Take 2 tablets (2,000 Units total) by mouth daily. (Patient taking differently: Take 1,000 Units by mouth daily. ), Disp: 100 tablet, Rfl: 3 .  glucose blood (ONETOUCH VERIO) test strip, Use to check blood sugar 2 times per day., Disp: 100 each, Rfl: 12 .  Insulin Syringe-Needle U-100 (SURE COMFORT INSULIN SYRINGE) 31G X 5/16" 0.5 ML MISC, USE AS DIRECTED WITH INSULIN 4 TIMES A DAY, Disp: 400 each, Rfl: 2 .  Lancet Devices  (ACCU-CHEK SOFTCLIX) lancets, Test two times daily, Disp: 100 each, Rfl: 12 .  vitamin B-12 (CYANOCOBALAMIN) 1000 MCG tablet, Take 1,000 mcg by mouth daily.  , Disp: , Rfl:  .  ALPRAZolam (XANAX) 0.5 MG tablet, Take tablet 30 mins prior to procedure for procedural anxiety., Disp: 1 tablet, Rfl: 0 .  baclofen (LIORESAL) 10 MG tablet, TAKE 2 TABLETS BY MOUTH  TWICE DAILY, Disp: 180 tablet, Rfl: 1 .  bisacodyl (DULCOLAX) 5 MG EC tablet, Take 10 mg by mouth at bedtime., Disp: , Rfl:  .  clonazePAM (KLONOPIN) 0.5 MG tablet, Take 1 tablet (0.5 mg total) by mouth 2 (two) times daily as needed for anxiety., Disp: 60 tablet, Rfl: 1 .  ezetimibe (ZETIA) 10 MG tablet, TAKE 1 TABLET BY MOUTH  DAILY, Disp: 90 tablet, Rfl: 3 .  fluticasone (FLONASE) 50 MCG/ACT nasal spray, Place 2 sprays into both nostrils daily., Disp: 16 g, Rfl: 6 .  gabapentin (NEURONTIN) 800 MG tablet, TAKE 1 TABLET BY MOUTH 4  TIMES DAILY (Patient taking differently: Take 800 mg by mouth in the morning, at noon, in the evening, and at bedtime. ), Disp: 360 tablet, Rfl: 3 .  HYDROmorphone (DILAUDID) 2 MG tablet, Take 1 tablet (2 mg total) by mouth every 4 (four) hours as needed for severe pain.,  Disp: 20 tablet, Rfl: 0 .  insulin NPH-regular Human (70-30) 100 UNIT/ML injection, Inject 10 Units into the skin 2 (two) times daily with a meal. (Patient taking differently: Inject 40 Units into the skin 2 (two) times daily with a meal. Blood Sugar>200 Take 40-50 units BID), Disp: 10 mL, Rfl: 11 .  lamoTRIgine (LAMICTAL) 25 MG tablet, TAKE 1 TABLET BY MOUTH TWO  TIMES DAILY, Disp: 180 tablet, Rfl: 3 .  Lancets (ONETOUCH DELICA PLUS [id number]) MISC, , Disp: , Rfl:  .  levothyroxine (SYNTHROID) 75 MCG tablet, TAKE 1 TABLET BY MOUTH  DAILY, Disp: 90 tablet, Rfl: 3 .  lisinopril (ZESTRIL) 10 MG tablet, TAKE 1 TABLET BY MOUTH  DAILY, Disp: 90 tablet, Rfl: 3 .  loratadine (CLARITIN) 10 MG tablet, Take 1 tablet (10 mg total) by mouth daily as needed for  allergies., Disp: 100 tablet, Rfl: 3 .  mupirocin ointment (BACTROBAN) 2 %, Apply 1 application topically daily as needed (cellulitis). , Disp: , Rfl:  .  oxyCODONE (ROXICODONE) 15 MG immediate release tablet, Take 1 tablet (15 mg total) by mouth every 6 (six) hours as needed., Disp: 120 tablet, Rfl: 0 .  rOPINIRole (REQUIP) 0.5 MG tablet, TAKE 1 TABLET BY MOUTH AT  BEDTIME, Disp: 90 tablet, Rfl: 3 .  senna-docusate (SENOKOT-S) 8.6-50 MG tablet, Take 1 tablet by mouth 2 (two) times daily. (Patient taking differently: Take 1 tablet by mouth at bedtime. ), Disp: 20 tablet, Rfl: 0 .  silver sulfADIAZINE (SILVADENE) 1 % cream, Apply pea-sized amount to wound daily., Disp: 50 g, Rfl: 0  Social History   Tobacco Use  Smoking Status Current Every Day Smoker  . Packs/day: 1.00  Smokeless Tobacco Never Used    Allergies  Allergen Reactions  . Hydromorphone Hcl Itching    Patient has been tolerating Hydromorphone tablets without adverse effect (07/09/19)  . Cephalexin Nausea And Vomiting  . Demerol  [Meperidine Hcl] Rash  . Lovastatin Other (See Comments)    Other reaction(s): Unknown (See Comments) Possible myalgia Unknown   . Metformin And Related Nausea And Vomiting  . Sulfamethoxazole-Trimethoprim     Other reaction(s): Unknown DILI, pancreatitis  . Crestor [Rosuvastatin Calcium]     myalgia  . Niacin Other (See Comments)    Unknown   . Paroxetine Other (See Comments)    Other reaction(s): Unknown (See Comments) unknown Unknown    Objective:   Vitals:   07/04/19 1538  Temp: 97.9 F (36.6 C)   There is no height or weight on file to calculate BMI. Constitutional Well developed. Well nourished.  Vascular Foot warm and well perfused. Capillary refill normal to all digits.   Neurologic Normal speech. Oriented to person, place, and time. Epicritic sensation to light touch grossly present bilaterally.  Dermatologic 5th MPJ sites well healed bilateral foot. 5th met base wound  with hyperkeratosis, granular wound base. No warmth, erythema, signs of infection.  Right leg edema noted  Orthopedic: Tenderness to palpation noted about the left 5th met base.  Cavus foot type right   Assessment:   1. Skin ulcer of right foot, limited to breakdown of skin [facility])    Plan:  Patient was evaluated and treated and all questions answered.  Right 5th Met Base wound with mild cellulitis -Wound debrided as below  Procedure: Selective Debridement of Wound Rationale: Removal of devitalized tissue from the wound to promote healing.  Pre-Debridement Wound Measurements: 1 cm x 1 cm x 0.2 cm  Post-Debridement Wound Measurements: same as  pre-debridement. Type of Debridement: sharp selective Tissue Removed: Devitalized soft-tissue Dressing: Dry, sterile, compression dressing. Disposition: Patient tolerated procedure well. Patient to return in 1 week for follow-up.     No follow-ups on file.

## 2020-05-13 NOTE — Progress Notes (Signed)
Subjective:  Patient ID: Katherine Herrera, female    DOB: 11/11/1965,  MRN: [id number]  Chief Complaint  Patient presents with  . Wound Check    Follow-up; Right foot; lateral side; pt stated, "I still have some drainage; not in any pain unless does a lot of walking"; pt Diabetic Type 2; Sugar=258 this am; A1C=7.8 (07/19/19)    54 y.o. female presents for wound care.   Review of Systems: Negative except as noted in the HPI. Denies N/V/F/Ch.  Past Medical History:  Diagnosis Date  . Anxiety   . COPD (chronic obstructive pulmonary disease) ([place])   . DM (diabetes mellitus) ([place])    type 2  . Family history of adverse reaction to anesthesia    mother hard to wake up  . Gout    possible  . Hyperlipidemia   . Hypothyroidism   . LBP (low back pain)    DR Patrice Paradise  . Migraine   . Peripheral neuropathy   . Syncope    poss conversion disorder vs psychogenic seizures  . Tachycardia    diet coke overuse?  Marland Kitchen Tenosynovitis of foot and ankle 09/03/2013  . TTS (tarsal tunnel syndrome) 2010   Left Foot Vogler in [place]  . Vitamin D deficiency   . Wound, open    both feet outer parts no drainage, changes dressing 1 day, wounds dime size for last month    Current Outpatient Medications:  .  aspirin (BAYER ASPIRIN) 325 MG tablet, Take 1 tablet (325 mg total) by mouth daily., Disp: 100 tablet, Rfl: 3 .  B-D INS SYR ULTRAFINE 1CC/31G 31G X 5/16" 1 ML MISC, USE AS DIRECTED, Disp: 100 each, Rfl: 0 .  Blood Glucose Monitoring Suppl (ONETOUCH VERIO) w/Device KIT, , Disp: , Rfl:  .  cholecalciferol (VITAMIN D) 25 MCG (1000 UT) tablet, Take 2 tablets (2,000 Units total) by mouth daily. (Patient taking differently: Take 1,000 Units by mouth daily. ), Disp: 100 tablet, Rfl: 3 .  gabapentin (NEURONTIN) 800 MG tablet, TAKE 1 TABLET BY MOUTH 4  TIMES DAILY (Patient taking differently: Take 800 mg by mouth in the morning, at noon, in the evening, and at bedtime. ), Disp: 360 tablet, Rfl: 3 .  glucose blood  (ONETOUCH VERIO) test strip, Use to check blood sugar 2 times per day., Disp: 100 each, Rfl: 12 .  insulin NPH-regular Human (70-30) 100 UNIT/ML injection, Inject 10 Units into the skin 2 (two) times daily with a meal. (Patient taking differently: Inject 40 Units into the skin 2 (two) times daily with a meal. Blood Sugar>200 Take 40-50 units BID), Disp: 10 mL, Rfl: 11 .  Insulin Syringe-Needle U-100 (SURE COMFORT INSULIN SYRINGE) 31G X 5/16" 0.5 ML MISC, USE AS DIRECTED WITH INSULIN 4 TIMES A DAY, Disp: 400 each, Rfl: 2 .  Lancet Devices (ACCU-CHEK SOFTCLIX) lancets, Test two times daily, Disp: 100 each, Rfl: 12 .  levothyroxine (SYNTHROID) 75 MCG tablet, TAKE 1 TABLET BY MOUTH  DAILY, Disp: 90 tablet, Rfl: 3 .  rOPINIRole (REQUIP) 0.5 MG tablet, TAKE 1 TABLET BY MOUTH AT  BEDTIME, Disp: 90 tablet, Rfl: 3 .  senna-docusate (SENOKOT-S) 8.6-50 MG tablet, Take 1 tablet by mouth 2 (two) times daily. (Patient taking differently: Take 1 tablet by mouth at bedtime. ), Disp: 20 tablet, Rfl: 0 .  vitamin B-12 (CYANOCOBALAMIN) 1000 MCG tablet, Take 1,000 mcg by mouth daily.  , Disp: , Rfl:  .  ALPRAZolam (XANAX) 0.5 MG tablet, Take tablet 30 mins prior  to procedure for procedural anxiety., Disp: 1 tablet, Rfl: 0 .  baclofen (LIORESAL) 10 MG tablet, TAKE 2 TABLETS BY MOUTH  TWICE DAILY, Disp: 180 tablet, Rfl: 1 .  bisacodyl (DULCOLAX) 5 MG EC tablet, Take 10 mg by mouth at bedtime., Disp: , Rfl:  .  clonazePAM (KLONOPIN) 0.5 MG tablet, Take 1 tablet (0.5 mg total) by mouth 2 (two) times daily as needed for anxiety., Disp: 60 tablet, Rfl: 1 .  ezetimibe (ZETIA) 10 MG tablet, TAKE 1 TABLET BY MOUTH  DAILY, Disp: 90 tablet, Rfl: 3 .  fluticasone (FLONASE) 50 MCG/ACT nasal spray, Place 2 sprays into both nostrils daily., Disp: 16 g, Rfl: 6 .  HYDROmorphone (DILAUDID) 2 MG tablet, Take 1 tablet (2 mg total) by mouth every 4 (four) hours as needed for severe pain., Disp: 20 tablet, Rfl: 0 .  lamoTRIgine (LAMICTAL) 25  MG tablet, TAKE 1 TABLET BY MOUTH TWO  TIMES DAILY, Disp: 180 tablet, Rfl: 3 .  Lancets (ONETOUCH DELICA PLUS [id number]) MISC, , Disp: , Rfl:  .  lisinopril (ZESTRIL) 10 MG tablet, TAKE 1 TABLET BY MOUTH  DAILY, Disp: 90 tablet, Rfl: 3 .  loratadine (CLARITIN) 10 MG tablet, Take 1 tablet (10 mg total) by mouth daily as needed for allergies., Disp: 100 tablet, Rfl: 3 .  mupirocin ointment (BACTROBAN) 2 %, Apply 1 application topically daily as needed (cellulitis). , Disp: , Rfl:  .  oxyCODONE (ROXICODONE) 15 MG immediate release tablet, Take 1 tablet (15 mg total) by mouth every 6 (six) hours as needed., Disp: 120 tablet, Rfl: 0 .  silver sulfADIAZINE (SILVADENE) 1 % cream, Apply pea-sized amount to wound daily., Disp: 50 g, Rfl: 0  Social History   Tobacco Use  Smoking Status Current Every Day Smoker  . Packs/day: 1.00  Smokeless Tobacco Never Used    Allergies  Allergen Reactions  . Hydromorphone Hcl Itching    Patient has been tolerating Hydromorphone tablets without adverse effect (07/09/19)  . Cephalexin Nausea And Vomiting  . Demerol  [Meperidine Hcl] Rash  . Lovastatin Other (See Comments)    Other reaction(s): Unknown (See Comments) Possible myalgia Unknown   . Metformin And Related Nausea And Vomiting  . Sulfamethoxazole-Trimethoprim     Other reaction(s): Unknown DILI, pancreatitis  . Crestor [Rosuvastatin Calcium]     myalgia  . Niacin Other (See Comments)    Unknown   . Paroxetine Other (See Comments)    Other reaction(s): Unknown (See Comments) unknown Unknown    Objective:   Vitals:   08/16/19 1107  Temp: (!) 97.5 F (36.4 C)   There is no height or weight on file to calculate BMI. Constitutional Well developed. Well nourished.  Vascular Dorsalis pedis pulses palpable bilaterally. Posterior tibial pulses palpable bilaterally. Capillary refill normal to all digits.  No cyanosis or clubbing noted. Pedal hair growth normal.  Neurologic Normal  speech. Oriented to person, place, and time. Protective sensation absent  Dermatologic Wound Location: Right 5th met lateral Wound Base: Granular/Healthy Peri-wound: Macerated Exudate: Scant/small amount Serosanguinous exudate Wound Measurements: -1.5x2  Orthopedic: No pain to palpation either foot.   Radiographs: None Assessment:   1. Diabetic ulcer of right midfoot associated with diabetes mellitus due to underlying condition, with necrosis of muscle ([place])    Plan:  Patient was evaluated and treated and all questions answered.  Ulcer right foot -Wound improving. -Continue Abx -Dress with prisma and DSD    No follow-ups on file.

## 2020-05-13 NOTE — Progress Notes (Signed)
Subjective:  Patient ID: Katherine Herrera, female    DOB: [birth year],  MRN: [id number]  Chief Complaint  Patient presents with  . Wound Check    Right foot wound check. Pt states improvement and healing without any concerns. Denies fever/chills/nausea/vomiting.    54 y.o. female presents for wound care.   Review of Systems: Negative except as noted in the HPI. Denies N/V/F/Ch.  Past Medical History:  Diagnosis Date  . Anxiety   . COPD (chronic obstructive pulmonary disease) ([place])   . DM (diabetes mellitus) ([place])    type 2  . Family history of adverse reaction to anesthesia    mother hard to wake up  . Gout    possible  . Hyperlipidemia   . Hypothyroidism   . LBP (low back pain)    DR Patrice Paradise  . Migraine   . Peripheral neuropathy   . Syncope    poss conversion disorder vs psychogenic seizures  . Tachycardia    diet coke overuse?  Marland Kitchen Tenosynovitis of foot and ankle 09/03/2013  . TTS (tarsal tunnel syndrome) 2010   Left Foot [facility] in [place]  . Vitamin D deficiency   . Wound, open    both feet outer parts no drainage, changes dressing 1 day, wounds dime size for last month    Current Outpatient Medications:  .  aspirin (BAYER ASPIRIN) 325 MG tablet, Take 1 tablet (325 mg total) by mouth daily., Disp: 100 tablet, Rfl: 3 .  B-D INS SYR ULTRAFINE 1CC/31G 31G X 5/16" 1 ML MISC, USE AS DIRECTED, Disp: 100 each, Rfl: 0 .  Blood Glucose Monitoring Suppl (ONETOUCH VERIO) w/Device KIT, , Disp: , Rfl:  .  cholecalciferol (VITAMIN D) 25 MCG (1000 UT) tablet, Take 2 tablets (2,000 Units total) by mouth daily. (Patient taking differently: Take 1,000 Units by mouth daily. ), Disp: 100 tablet, Rfl: 3 .  glucose blood (ONETOUCH VERIO) test strip, Use to check blood sugar 2 times per day., Disp: 100 each, Rfl: 12 .  insulin NPH-regular Human (70-30) 100 UNIT/ML injection, Inject 10 Units into the skin 2 (two) times daily with a meal. (Patient taking differently: Inject 40 Units into the skin 2  (two) times daily with a meal. Blood Sugar>200 Take 40-50 units BID), Disp: 10 mL, Rfl: 11 .  Insulin Syringe-Needle U-100 (SURE COMFORT INSULIN SYRINGE) 31G X 5/16" 0.5 ML MISC, USE AS DIRECTED WITH INSULIN 4 TIMES A DAY, Disp: 400 each, Rfl: 2 .  Lancet Devices (ACCU-CHEK SOFTCLIX) lancets, Test two times daily, Disp: 100 each, Rfl: 12 .  senna-docusate (SENOKOT-S) 8.6-50 MG tablet, Take 1 tablet by mouth 2 (two) times daily. (Patient taking differently: Take 1 tablet by mouth at bedtime. ), Disp: 20 tablet, Rfl: 0 .  vitamin B-12 (CYANOCOBALAMIN) 1000 MCG tablet, Take 1,000 mcg by mouth daily.  , Disp: , Rfl:  .  ALPRAZolam (XANAX) 0.5 MG tablet, Take tablet 30 mins prior to procedure for procedural anxiety., Disp: 1 tablet, Rfl: 0 .  baclofen (LIORESAL) 10 MG tablet, TAKE 2 TABLETS BY MOUTH  TWICE DAILY, Disp: 180 tablet, Rfl: 1 .  bisacodyl (DULCOLAX) 5 MG EC tablet, Take 10 mg by mouth at bedtime., Disp: , Rfl:  .  clonazePAM (KLONOPIN) 0.5 MG tablet, Take 1 tablet (0.5 mg total) by mouth 2 (two) times daily as needed for anxiety., Disp: 60 tablet, Rfl: 1 .  ezetimibe (ZETIA) 10 MG tablet, TAKE 1 TABLET BY MOUTH  DAILY, Disp: 90 tablet, Rfl: 3 .  fluticasone (FLONASE) 50 MCG/ACT nasal spray, Place 2 sprays into both nostrils daily., Disp: 16 g, Rfl: 6 .  gabapentin (NEURONTIN) 800 MG tablet, TAKE 1 TABLET BY MOUTH 4  TIMES DAILY (Patient taking differently: Take 800 mg by mouth in the morning, at noon, in the evening, and at bedtime. ), Disp: 360 tablet, Rfl: 3 .  HYDROmorphone (DILAUDID) 2 MG tablet, Take 1 tablet (2 mg total) by mouth every 4 (four) hours as needed for severe pain., Disp: 20 tablet, Rfl: 0 .  lamoTRIgine (LAMICTAL) 25 MG tablet, TAKE 1 TABLET BY MOUTH TWO  TIMES DAILY, Disp: 180 tablet, Rfl: 3 .  Lancets (ONETOUCH DELICA PLUS [id number]) MISC, , Disp: , Rfl:  .  levothyroxine (SYNTHROID) 75 MCG tablet, TAKE 1 TABLET BY MOUTH  DAILY, Disp: 90 tablet, Rfl: 3 .  lisinopril  (ZESTRIL) 10 MG tablet, TAKE 1 TABLET BY MOUTH  DAILY, Disp: 90 tablet, Rfl: 3 .  loratadine (CLARITIN) 10 MG tablet, Take 1 tablet (10 mg total) by mouth daily as needed for allergies., Disp: 100 tablet, Rfl: 3 .  mupirocin ointment (BACTROBAN) 2 %, Apply 1 application topically daily as needed (cellulitis). , Disp: , Rfl:  .  oxyCODONE (ROXICODONE) 15 MG immediate release tablet, Take 1 tablet (15 mg total) by mouth every 6 (six) hours as needed., Disp: 120 tablet, Rfl: 0 .  rOPINIRole (REQUIP) 0.5 MG tablet, TAKE 1 TABLET BY MOUTH AT  BEDTIME, Disp: 90 tablet, Rfl: 3 .  silver sulfADIAZINE (SILVADENE) 1 % cream, Apply pea-sized amount to wound daily., Disp: 50 g, Rfl: 0  Social History   Tobacco Use  Smoking Status Current Every Day Smoker  . Packs/day: 1.00  Smokeless Tobacco Never Used    Allergies  Allergen Reactions  . Hydromorphone Hcl Itching    Patient has been tolerating Hydromorphone tablets without adverse effect (07/09/19)  . Cephalexin Nausea And Vomiting  . Demerol  [Meperidine Hcl] Rash  . Lovastatin Other (See Comments)    Other reaction(s): Unknown (See Comments) Possible myalgia Unknown   . Metformin And Related Nausea And Vomiting  . Sulfamethoxazole-Trimethoprim     Other reaction(s): Unknown DILI, pancreatitis  . Crestor [Rosuvastatin Calcium]     myalgia  . Niacin Other (See Comments)    Unknown   . Paroxetine Other (See Comments)    Other reaction(s): Unknown (See Comments) unknown Unknown    Objective:   Vitals:   08/08/19 1519  Temp: 97.6 F (36.4 C)   There is no height or weight on file to calculate BMI. Constitutional Well developed. Well nourished.  Vascular Dorsalis pedis pulses palpable bilaterally. Posterior tibial pulses palpable bilaterally. Capillary refill normal to all digits.  No cyanosis or clubbing noted. Pedal hair growth normal.  Neurologic Normal speech. Oriented to person, place, and time. Protective sensation absent    Dermatologic Wound Location: Right 5th met lateral Wound Base: Granular/Healthy Peri-wound: Macerated Exudate: Scant/small amount Serosanguinous exudate Wound Measurements: -2x2  Orthopedic: No pain to palpation either foot.   Radiographs: None Assessment:   1. Diabetic ulcer of right midfoot associated with diabetes mellitus due to underlying condition, with necrosis of muscle ([place])    Plan:  Patient was evaluated and treated and all questions answered.  Ulcer right foot -Wound improving. Wound VAC reapplied. Good seal noted. -Continue Abx -Refill pain rx  Procedure: Wound VAC Application Location: right midfoot Wound Measurement: 2 cm x 2 cm x 0.2 cm  Technique: Black foam to wound base, followed by  adherent dressing. Set to 125 mmHg with good seal noted. Disposition: Patient tolerated procedure well.     No follow-ups on file.

## 2020-05-13 NOTE — Progress Notes (Signed)
  Subjective:  Patient ID: Katherine Herrera, female    DOB: 04/17/1966,  MRN: [id number]  Chief Complaint  Patient presents with  . Wound Check    R plantar midfoot. Pt stated, "It's the same as it was prior to the infection. Finished doxycycline. Redness and pus resolved. No fever/chills/N&V. Some pain at night. Using Silvadene".    54 y.o. female presents for wound care. Hx confirmed with patient.  Objective:  Physical Exam: Wound Location: right 5th met base Wound Measurement: 1x1 Wound Base: Granular/Healthy Peri-wound: Reddened Exudate: None: wound tissue dry appearance is improved to last recheck with less dorsal redness.  Assessment:   1. Diabetic ulcer of right midfoot associated with diabetes mellitus due to underlying condition, with necrosis of muscle ([place])      Plan:  Patient was evaluated and treated and all questions answered.  Ulcer right foot -Continue CAM boot -Wound cleansed and debrided -Pending MRI to r/o Osteomyelitis  Procedure: Selective Debridement of Wound Rationale: Removal of devitalized tissue from the wound to promote healing.  Pre-Debridement Wound Measurements: 1 cm x 1 cm x 0.2 cm  Post-Debridement Wound Measurements: same as pre-debridement. Type of Debridement: sharp selective Tissue Removed: Devitalized soft-tissue Dressing: Dry, sterile, compression dressing. Disposition: Patient tolerated procedure well. Patient to return in 1 week for follow-up.  No follow-ups on file.

## 2020-05-18 DIAGNOSIS — H5213 Myopia, bilateral: Secondary | ICD-10-CM | POA: Diagnosis not present

## 2020-05-19 ENCOUNTER — Other Ambulatory Visit: Payer: Self-pay

## 2020-05-19 NOTE — Telephone Encounter (Signed)
New message    1.Medication Requested:oxyCODONE (ROXICODONE) 15 MG immediate release tablet  2. Pharmacy (Name, Street, City):oxy[facility]OD[facility] (ROXICODONE) 15 MG immediate release tablet  3. On Med List: YES   4. Last Visit with PCP: 6.22.21   5. Next visit date with PCP: 9.23.21    Agent: Please be advised that RX refills may take up to 3 business days. We ask that you follow-up with your pharmacy.

## 2020-05-20 NOTE — Telephone Encounter (Signed)
[place] Controlled Database Checked Last filled: 04/21/2020 (120) LOV w/you: 04/21/2020 Next appt w/you: 07/23/2020

## 2020-05-21 ENCOUNTER — Ambulatory Visit (INDEPENDENT_AMBULATORY_CARE_PROVIDER_SITE_OTHER): Payer: Medicare Other | Admitting: Podiatry

## 2020-05-21 ENCOUNTER — Other Ambulatory Visit: Payer: Self-pay

## 2020-05-21 DIAGNOSIS — L97413 Non-pressure chronic ulcer of right heel and midfoot with necrosis of muscle: Secondary | ICD-10-CM

## 2020-05-21 DIAGNOSIS — M868X7 Other osteomyelitis, ankle and foot: Secondary | ICD-10-CM | POA: Diagnosis not present

## 2020-05-21 DIAGNOSIS — L02619 Cutaneous abscess of unspecified foot: Secondary | ICD-10-CM

## 2020-05-21 DIAGNOSIS — E08621 Diabetes mellitus due to underlying condition with foot ulcer: Secondary | ICD-10-CM | POA: Diagnosis not present

## 2020-05-21 DIAGNOSIS — L03119 Cellulitis of unspecified part of limb: Secondary | ICD-10-CM | POA: Diagnosis not present

## 2020-05-21 DIAGNOSIS — E1142 Type 2 diabetes mellitus with diabetic polyneuropathy: Secondary | ICD-10-CM | POA: Diagnosis not present

## 2020-05-21 NOTE — Progress Notes (Signed)
  Subjective:  Patient ID: Katherine Herrera, female    DOB: 1966-06-14,  MRN: [id number]  Chief Complaint  Patient presents with  . Wound Check    R plantar midfoot. Pt stated, "It feels worse. 10/10 shooting pain that goes up from my foot and into the [lateral] side of my leg. I've seen some pus for about 4 days". Requested new boot.    54 y.o. female presents for wound care. Hx confirmed with patient.  Objective:  Physical Exam: Wound Location: right 5th met base Wound Measurement: 1.5x1 Wound Base: Granular/Healthy Peri-wound: hyperkeratotic Exudate: None: wound tissue dry No warmth erythema signs of infection  MRI reviewed: Findings most compatible with acute on chronic osteomyelitis in the distal 1.5 cm of the cuboid at the level of the fourth and fifth tarsometatarsal joints and bases of the fourth and fifth metatarsals.  Assessment:   1. Diabetic ulcer of right midfoot associated with diabetes mellitus due to underlying condition, with necrosis of muscle ([place])   2. Other osteomyelitis of right foot ([place])   3. DM type 2 with diabetic peripheral neuropathy ([facility])   4. Cellulitis and abscess of foot, except toes      Plan:  Patient was evaluated and treated and all questions answered.  Ulcer right foot -MRI reviewed suggesting chronic OM. No acute signs of wound infection today. -Selective debridement today -Dressed with silvadene and mepilex border dressing -Hold abx for surgery, biopsy -Patient has failed all conservative therapy and wishes to proceed with surgical intervention. All risks, benefits, and alternatives discussed with patient. No guarantees given. Consent reviewed and signed by patient. -Planned procedures: wound debridement, bone biopsy right   Procedure: Selective Debridement of Wound Rationale: Removal of devitalized tissue from the wound to promote healing.  Pre-Debridement Wound Measurements: 1.5 cm x 1.5 cm x 0.3 cm  Post-Debridement Wound  Measurements: same as pre-debridement. Type of Debridement: sharp selective Tissue Removed: Devitalized soft-tissue Dressing: Dry, sterile, compression dressing. Disposition: Patient tolerated procedure well. Patient to return in 1 week for follow-up.      No follow-ups on file.

## 2020-05-21 NOTE — H&P (View-Only) (Signed)
  Subjective:  Patient ID: Katherine Herrera, female    DOB: 07/23/1966,  MRN: [id number]  Chief Complaint  Patient presents with  . Wound Check    R plantar midfoot. Pt stated, "It feels worse. 10/10 shooting pain that goes up from my foot and into the [lateral] side of my leg. I've seen some pus for about 4 days". Requested new boot.    54 y.o. female presents for wound care. Hx confirmed with patient.  Objective:  Physical Exam: Wound Location: right 5th met base Wound Measurement: 1.5x1 Wound Base: Granular/Healthy Peri-wound: hyperkeratotic Exudate: None: wound tissue dry No warmth erythema signs of infection  MRI reviewed: Findings most compatible with acute on chronic osteomyelitis in the distal 1.5 cm of the cuboid at the level of the fourth and fifth tarsometatarsal joints and bases of the fourth and fifth metatarsals.  Assessment:   1. Diabetic ulcer of right midfoot associated with diabetes mellitus due to underlying condition, with necrosis of muscle ([facility])   2. Other osteomyelitis of right foot ([facility])   3. DM type 2 with diabetic peripheral neuropathy ([facility])   4. Cellulitis and abscess of foot, except toes      Plan:  Patient was evaluated and treated and all questions answered.  Ulcer right foot -MRI reviewed suggesting chronic OM. No acute signs of wound infection today. -Selective debridement today -Dressed with silvadene and mepilex border dressing -Hold abx for surgery, biopsy -Patient has failed all conservative therapy and wishes to proceed with surgical intervention. All risks, benefits, and alternatives discussed with patient. No guarantees given. Consent reviewed and signed by patient. -Planned procedures: wound debridement, bone biopsy right   Procedure: Selective Debridement of Wound Rationale: Removal of devitalized tissue from the wound to promote healing.  Pre-Debridement Wound Measurements: 1.5 cm x 1.5 cm x 0.3 cm  Post-Debridement Wound  Measurements: same as pre-debridement. Type of Debridement: sharp selective Tissue Removed: Devitalized soft-tissue Dressing: Dry, sterile, compression dressing. Disposition: Patient tolerated procedure well. Patient to return in 1 week for follow-up.      No follow-ups on file.  

## 2020-05-22 ENCOUNTER — Telehealth: Payer: Self-pay

## 2020-05-22 ENCOUNTER — Telehealth: Payer: Self-pay | Admitting: Internal Medicine

## 2020-05-22 MED ORDER — OXYCODONE HCL 15 MG PO TABS: 15.0000 mg | ORAL_TABLET | Freq: Four times a day (QID) | ORAL | 0 refills | Status: DC | PRN

## 2020-05-22 NOTE — Telephone Encounter (Signed)
Patient calling for status of refill  

## 2020-05-22 NOTE — Telephone Encounter (Signed)
DOS 05/29/2020  Wound debridement & irrigation rt - 11043 BONE BIOPSY - 20220  [facility] EFFECTIVE DATE - 02/29/2020  PLAN DEDUCTIBLE - $0.00 OUT OF POCKET - $4500.00 W/ $4380.33 REMAINING  CO-INSURANCE 0% / Day OUTPATIENT SURGERY 0% / [place] $325 / Day OUTPATIENT SURGERY $325 / [place]   Notification or Prior Authorization is not required for the requested services  Decision ID #:[id number]

## 2020-05-22 NOTE — Telephone Encounter (Signed)
New Message:   Pt is calling to see if we have received a form from the hospital that the pt needs filled out for her to have surgery on next week. Please advise.

## 2020-05-25 ENCOUNTER — Encounter (HOSPITAL_BASED_OUTPATIENT_CLINIC_OR_DEPARTMENT_OTHER): Payer: Self-pay | Admitting: Podiatry

## 2020-05-25 NOTE — Telephone Encounter (Signed)
Left pt a vm informing her that I still have not received anything in regards to the documentation she needs.

## 2020-05-26 ENCOUNTER — Telehealth: Payer: Self-pay

## 2020-05-26 ENCOUNTER — Other Ambulatory Visit: Payer: Self-pay

## 2020-05-26 ENCOUNTER — Encounter (HOSPITAL_BASED_OUTPATIENT_CLINIC_OR_DEPARTMENT_OTHER): Payer: Self-pay | Admitting: Podiatry

## 2020-05-26 NOTE — Telephone Encounter (Signed)
1.Medication Requested:oxyCODONE (ROXICODONE) 15 MG immediate release tablet  2. Pharmacy (Name, Street, City):[id number] - [place], [place] [address]  3. On Med List: Yes   4. Last Visit with PCP: 6.22.21  5. Next visit date with PCP:9.23.21   Agent: Please be advised that RX refills may take up to 3 business days. We ask that you follow-up with your pharmacy.

## 2020-05-26 NOTE — Progress Notes (Addendum)
ADDENDUM:  Spoke w/ anesthesia, Konrad Felix PA,  Stated pt should do any insulin morning of surgery.  Called and spoke w/ pt via phone she verbalized understanding to not do any insulin morning of surgery.  Received pt's pcp, Dr Alain Marion, H&P placed in chart.  Spoke w/ via phone for pre-op interview--- PT Lab needs dos---- Istat               Lab results------ current ekg in epic/ chart COVID test ------ 05-27-2020 @ 1200 Arrive at ------- 1230 NPO after MN NO Solid Food.  Clear liquids from MN until--- 1130 then nothing by mouth Medications to take morning of surgery ----- Gabapentin, Lamictal, Baclofen, Synthroid, Zetia w/ sips of water Diabetic medication ----- pt to do 70% dose of her insulin night before surgery.   Patient Special Instructions ----- will call pt back with instructions about morning insulin after reviewing with anesthesia Pre-Op special Istructions ----- have not received pt's pcp H&P from Dr March Rummage office yet. Patient verbalized understanding of instructions that were given at this phone interview. Patient denies shortness of breath, chest pain, fever, cough a this phone interview.   Anesthesia Review: Hx IDDM2, chronic pain syndrome.  Per pt has limited ROM of cervical neck d/t fusion's.  Chart to be reviewed by anesthesia, Konrad Felix PA  PCP: Dr A. Plotnikov Endocrinologist:  Dr Chalmers Cater (pt stated lov approx. 04/ 2021) Cardiologist :  no Chest x-ray : 12-31-2019 [facility] EKG : 07-22-2019 epic Echo : 12-26-2018 epic Cardiac Cath :  05-27-2002  epic Activity level:   No sob with normal daily activities but does get sob going up stairs Sleep Study/ CPAP :  NO Fasting Blood Sugar :   Am 200--250   / Checks Blood Sugar -- times a day:   Twice daily Blood Thinner/ Instructions Maryjane Hurter Dose:  NO ASA / Instructions/ Last Dose :  ASA 325mg /  Pt stated Dr March Rummage did give any instructions from him about stopping or not.  Pt stated she had stopped with previous  surgery's of her foot.

## 2020-05-27 ENCOUNTER — Other Ambulatory Visit (HOSPITAL_COMMUNITY)
Admission: RE | Admit: 2020-05-27 | Discharge: 2020-05-27 | Disposition: A | Discharge status: Home / Self Care | Payer: Medicare Other | Source: Ambulatory Visit | Attending: Podiatry | Admitting: Podiatry

## 2020-05-27 DIAGNOSIS — Z20822 Contact with and (suspected) exposure to covid-19: Secondary | ICD-10-CM | POA: Diagnosis not present

## 2020-05-27 DIAGNOSIS — Z01812 Encounter for preprocedural laboratory examination: Secondary | ICD-10-CM | POA: Diagnosis not present

## 2020-05-27 LAB — SARS CORONAVIRUS 2 (TAT 6-24 HRS): SARS Coronavirus 2: NEGATIVE

## 2020-05-27 NOTE — Telephone Encounter (Signed)
F/u    Checking on the status of rx.Marland Kitchen

## 2020-05-28 ENCOUNTER — Ambulatory Visit: Payer: Medicare Other | Admitting: Podiatry

## 2020-05-28 ENCOUNTER — Telehealth: Payer: Self-pay

## 2020-05-28 ENCOUNTER — Other Ambulatory Visit: Payer: Self-pay | Admitting: Internal Medicine

## 2020-05-28 NOTE — Telephone Encounter (Signed)
I sent requested fax to [facility] for updated physical. Dr. Alain Marion requested I send the most recent physical (04/21/2020) and he wrote physical date on requested physical form. [facility] requested there be a note on form stating "everything was still the same" from 04/21/2020 physical and write today's date on form. I received ok from Dr.plotnikov to do so.

## 2020-05-28 NOTE — Telephone Encounter (Signed)
Called pt to inform her , her prescription was sent into [facility] on 05/22/2020. Left vm

## 2020-05-29 ENCOUNTER — Ambulatory Visit (HOSPITAL_BASED_OUTPATIENT_CLINIC_OR_DEPARTMENT_OTHER): Payer: Medicare Other | Admitting: Anesthesiology

## 2020-05-29 ENCOUNTER — Encounter (HOSPITAL_BASED_OUTPATIENT_CLINIC_OR_DEPARTMENT_OTHER)
Admission: RE | Disposition: A | Discharge status: Home / Self Care | Payer: Self-pay | Source: Home / Self Care | Attending: Podiatry

## 2020-05-29 ENCOUNTER — Ambulatory Visit (HOSPITAL_BASED_OUTPATIENT_CLINIC_OR_DEPARTMENT_OTHER)
Admission: RE | Admit: 2020-05-29 | Discharge: 2020-05-29 | Disposition: A | Discharge status: Home / Self Care | Payer: Medicare Other | Attending: Podiatry | Admitting: Podiatry

## 2020-05-29 ENCOUNTER — Encounter (HOSPITAL_BASED_OUTPATIENT_CLINIC_OR_DEPARTMENT_OTHER): Payer: Self-pay | Admitting: Podiatry

## 2020-05-29 DIAGNOSIS — E1169 Type 2 diabetes mellitus with other specified complication: Secondary | ICD-10-CM | POA: Diagnosis not present

## 2020-05-29 DIAGNOSIS — L97513 Non-pressure chronic ulcer of other part of right foot with necrosis of muscle: Secondary | ICD-10-CM | POA: Diagnosis not present

## 2020-05-29 DIAGNOSIS — M86671 Other chronic osteomyelitis, right ankle and foot: Secondary | ICD-10-CM

## 2020-05-29 DIAGNOSIS — L97519 Non-pressure chronic ulcer of other part of right foot with unspecified severity: Secondary | ICD-10-CM | POA: Insufficient documentation

## 2020-05-29 DIAGNOSIS — E08621 Diabetes mellitus due to underlying condition with foot ulcer: Secondary | ICD-10-CM

## 2020-05-29 DIAGNOSIS — Z794 Long term (current) use of insulin: Secondary | ICD-10-CM | POA: Diagnosis not present

## 2020-05-29 DIAGNOSIS — E11621 Type 2 diabetes mellitus with foot ulcer: Secondary | ICD-10-CM | POA: Insufficient documentation

## 2020-05-29 DIAGNOSIS — E1142 Type 2 diabetes mellitus with diabetic polyneuropathy: Secondary | ICD-10-CM | POA: Insufficient documentation

## 2020-05-29 DIAGNOSIS — L859 Epidermal thickening, unspecified: Secondary | ICD-10-CM | POA: Insufficient documentation

## 2020-05-29 DIAGNOSIS — M869 Osteomyelitis, unspecified: Secondary | ICD-10-CM | POA: Diagnosis not present

## 2020-05-29 DIAGNOSIS — I10 Essential (primary) hypertension: Secondary | ICD-10-CM | POA: Diagnosis not present

## 2020-05-29 HISTORY — DX: Other constipation: K59.09

## 2020-05-29 HISTORY — DX: Type 2 diabetes mellitus with foot ulcer: E11.621

## 2020-05-29 HISTORY — DX: Personal history of other infectious and parasitic diseases: Z86.19

## 2020-05-29 HISTORY — DX: Other specified dorsopathies, cervical region: M53.82

## 2020-05-29 HISTORY — DX: Unspecified osteoarthritis, unspecified site: M19.90

## 2020-05-29 HISTORY — DX: Chronic pain syndrome: G89.4

## 2020-05-29 HISTORY — DX: Personal history of other specified conditions: Z87.898

## 2020-05-29 HISTORY — DX: Type 2 diabetes mellitus without complications: E11.9

## 2020-05-29 HISTORY — DX: Unspecified right bundle-branch block: I45.10

## 2020-05-29 HISTORY — DX: Deficiency of other specified B group vitamins: E53.8

## 2020-05-29 HISTORY — PX: BONE BIOPSY: SHX375

## 2020-05-29 HISTORY — PX: INCISION AND DRAINAGE OF WOUND: SHX1803

## 2020-05-29 HISTORY — DX: Non-pressure chronic ulcer of other part of right foot with unspecified severity: L97.519

## 2020-05-29 HISTORY — DX: Presence of spectacles and contact lenses: Z97.3

## 2020-05-29 HISTORY — DX: Type 2 diabetes mellitus without complications: Z79.4

## 2020-05-29 LAB — POCT I-STAT, CHEM 8
BUN: 21 mg/dL — ABNORMAL HIGH (ref 6–20)
Calcium, Ion: 1.19 mmol/L (ref 1.15–1.40)
Chloride: 98 mmol/L (ref 98–111)
Creatinine, Ser: 0.9 mg/dL (ref 0.44–1.00)
Glucose, Bld: 253 mg/dL — ABNORMAL HIGH (ref 70–99)
HCT: 48 % — ABNORMAL HIGH (ref 36.0–46.0)
Hemoglobin: 16.3 g/dL — ABNORMAL HIGH (ref 12.0–15.0)
Potassium: 4.6 mmol/L (ref 3.5–5.1)
Sodium: 137 mmol/L (ref 135–145)
TCO2: 24 mmol/L (ref 22–32)

## 2020-05-29 LAB — GLUCOSE, CAPILLARY: Glucose-Capillary: 190 mg/dL — ABNORMAL HIGH (ref 70–99)

## 2020-05-29 SURGERY — IRRIGATION AND DEBRIDEMENT WOUND
Anesthesia: Monitor Anesthesia Care | Site: Foot | Laterality: Right

## 2020-05-29 MED ORDER — INSULIN ASPART 100 UNIT/ML ~~LOC~~ SOLN
SUBCUTANEOUS | Status: AC
  Filled 2020-05-29: qty 1

## 2020-05-29 MED ORDER — PROMETHAZINE HCL 25 MG/ML IJ SOLN: 6.2500 mg | INTRAMUSCULAR | Status: DC | PRN

## 2020-05-29 MED ORDER — FENTANYL CITRATE (PF) 100 MCG/2ML IJ SOLN
25.0000 ug | INTRAMUSCULAR | Status: DC | PRN
  Administered 2020-05-29: 50 ug via INTRAVENOUS
  Administered 2020-05-29: 25 ug via INTRAVENOUS

## 2020-05-29 MED ORDER — MIDAZOLAM HCL 2 MG/2ML IJ SOLN
INTRAMUSCULAR | Status: AC
  Filled 2020-05-29: qty 2

## 2020-05-29 MED ORDER — BUPIVACAINE HCL (PF) 0.5 % IJ SOLN
INTRAMUSCULAR | Status: DC | PRN
  Administered 2020-05-29: 10 mL

## 2020-05-29 MED ORDER — ACETAMINOPHEN 500 MG PO TABS
1000.0000 mg | ORAL_TABLET | Freq: Once | ORAL | Status: AC
  Administered 2020-05-29: 1000 mg via ORAL

## 2020-05-29 MED ORDER — INSULIN REGULAR HUMAN 100 UNIT/ML IJ SOLN
6.0000 [IU] | Freq: Once | INTRAMUSCULAR | Status: AC
  Administered 2020-05-29: 6 [IU] via SUBCUTANEOUS
  Filled 2020-05-29: qty 3

## 2020-05-29 MED ORDER — OXYCODONE HCL 20 MG PO TABS: 20.0000 mg | ORAL_TABLET | Freq: Four times a day (QID) | ORAL | 0 refills | Status: DC | PRN

## 2020-05-29 MED ORDER — MIDAZOLAM HCL 2 MG/2ML IJ SOLN
INTRAMUSCULAR | Status: DC | PRN
  Administered 2020-05-29: 1 mg via INTRAVENOUS

## 2020-05-29 MED ORDER — ACETAMINOPHEN 500 MG PO TABS
ORAL_TABLET | ORAL | Status: AC
  Filled 2020-05-29: qty 2

## 2020-05-29 MED ORDER — PROPOFOL 500 MG/50ML IV EMUL
INTRAVENOUS | Status: DC | PRN
  Administered 2020-05-29: 100 ug/kg/min via INTRAVENOUS

## 2020-05-29 MED ORDER — PROPOFOL 500 MG/50ML IV EMUL
INTRAVENOUS | Status: AC
  Filled 2020-05-29: qty 50

## 2020-05-29 MED ORDER — SODIUM CHLORIDE 0.9 % IR SOLN
Status: DC | PRN
  Administered 2020-05-29: 1000 mL

## 2020-05-29 MED ORDER — PROPOFOL 10 MG/ML IV BOLUS
INTRAVENOUS | Status: DC | PRN
  Administered 2020-05-29: 30 mg via INTRAVENOUS

## 2020-05-29 MED ORDER — LACTATED RINGERS IV SOLN: INTRAVENOUS | Status: DC

## 2020-05-29 MED ORDER — CLINDAMYCIN HCL 300 MG PO CAPS
300.0000 mg | ORAL_CAPSULE | Freq: Two times a day (BID) | ORAL | 0 refills | Status: DC
Start: 2020-05-29 — End: 2021-04-28

## 2020-05-29 MED ORDER — FENTANYL CITRATE (PF) 100 MCG/2ML IJ SOLN
INTRAMUSCULAR | Status: AC
  Filled 2020-05-29: qty 2

## 2020-05-29 SURGICAL SUPPLY — 61 items
APL PRP STRL LF DISP 70% ISPRP (MISCELLANEOUS)
BLADE SURG 15 STRL LF DISP TIS (BLADE) ×1 IMPLANT
BLADE SURG 15 STRL SS (BLADE) ×4
BNDG CMPR 9X4 STRL LF SNTH (GAUZE/BANDAGES/DRESSINGS)
BNDG ELASTIC 3X5.8 VLCR STR LF (GAUZE/BANDAGES/DRESSINGS) ×2 IMPLANT
BNDG ELASTIC 4X5.8 VLCR STR LF (GAUZE/BANDAGES/DRESSINGS) ×2 IMPLANT
BNDG ESMARK 4X9 LF (GAUZE/BANDAGES/DRESSINGS) IMPLANT
BNDG GAUZE ELAST 4 BULKY (GAUZE/BANDAGES/DRESSINGS) ×2 IMPLANT
CHLORAPREP W/TINT 26 (MISCELLANEOUS) IMPLANT
CNTNR URN SCR LID CUP LEK RST (MISCELLANEOUS) IMPLANT
CONT SPEC 4OZ STRL OR WHT (MISCELLANEOUS) ×4
COVER BACK TABLE 60X90IN (DRAPES) ×2 IMPLANT
COVER WAND RF STERILE (DRAPES) ×2 IMPLANT
CUFF TOURN SGL QUICK 18X4 (TOURNIQUET CUFF) IMPLANT
CUFF TOURN SGL QUICK 24 (TOURNIQUET CUFF)
CUFF TRNQT CYL 24X4X16.5-23 (TOURNIQUET CUFF) IMPLANT
DRAPE 3/4 80X56 (DRAPES) ×2 IMPLANT
DRAPE EXTREMITY T 121X128X90 (DISPOSABLE) ×2 IMPLANT
DRAPE SHEET LG 3/4 BI-LAMINATE (DRAPES) ×2 IMPLANT
DRAPE U-SHAPE 47X51 STRL (DRAPES) ×1 IMPLANT
ELECT REM PT RETURN 9FT ADLT (ELECTROSURGICAL) ×2
ELECTRODE REM PT RTRN 9FT ADLT (ELECTROSURGICAL) ×1 IMPLANT
GAUZE SPONGE 4X4 12PLY STRL (GAUZE/BANDAGES/DRESSINGS) ×2 IMPLANT
GAUZE XEROFORM 1X8 LF (GAUZE/BANDAGES/DRESSINGS) ×1 IMPLANT
GLOVE BIO SURGEON STRL SZ 6.5 (GLOVE) ×1 IMPLANT
GLOVE BIO SURGEON STRL SZ7.5 (GLOVE) ×2 IMPLANT
GLOVE BIOGEL PI IND STRL 7.0 (GLOVE) IMPLANT
GLOVE BIOGEL PI IND STRL 7.5 (GLOVE) IMPLANT
GLOVE BIOGEL PI IND STRL 8 (GLOVE) ×1 IMPLANT
GLOVE BIOGEL PI INDICATOR 7.0 (GLOVE) ×1
GLOVE BIOGEL PI INDICATOR 7.5 (GLOVE) ×1
GLOVE BIOGEL PI INDICATOR 8 (GLOVE) ×1
GOWN STRL REUS W/ TWL XL LVL3 (GOWN DISPOSABLE) ×1 IMPLANT
GOWN STRL REUS W/TWL LRG LVL3 (GOWN DISPOSABLE) ×1 IMPLANT
GOWN STRL REUS W/TWL XL LVL3 (GOWN DISPOSABLE) ×2
MANIFOLD NEPTUNE II (INSTRUMENTS) ×1 IMPLANT
NDL HYPO 25X1 1.5 SAFETY (NEEDLE) ×1 IMPLANT
NEEDLE HYPO 25X1 1.5 SAFETY (NEEDLE) ×2 IMPLANT
NS IRRIG 1000ML POUR BTL (IV SOLUTION) ×1 IMPLANT
PACK BASIN DAY SURGERY FS (CUSTOM PROCEDURE TRAY) ×2 IMPLANT
PADDING CAST ABS 4INX4YD NS (CAST SUPPLIES) ×1
PADDING CAST ABS COTTON 4X4 ST (CAST SUPPLIES) ×1 IMPLANT
PENCIL SMOKE EVAC W/HOLSTER (ELECTROSURGICAL) ×1 IMPLANT
SET IRRIG Y TYPE TUR BLADDER L (SET/KITS/TRAYS/PACK) IMPLANT
SPONGE LAP 4X18 RFD (DISPOSABLE) ×2 IMPLANT
STAPLER VISISTAT 35W (STAPLE) IMPLANT
STOCKINETTE 6  STRL (DRAPES) ×2
STOCKINETTE 6 STRL (DRAPES) ×1 IMPLANT
SUCTION FRAZIER HANDLE 10FR (MISCELLANEOUS)
SUCTION TUBE FRAZIER 10FR DISP (MISCELLANEOUS) IMPLANT
SUT ETHILON 4 0 PS 2 18 (SUTURE) ×1 IMPLANT
SUT MNCRL AB 3-0 PS2 18 (SUTURE) IMPLANT
SUT MNCRL AB 4-0 PS2 18 (SUTURE) IMPLANT
SUT VIC AB 2-0 SH 27 (SUTURE)
SUT VIC AB 2-0 SH 27XBRD (SUTURE) IMPLANT
SYR BULB EAR ULCER 3OZ GRN STR (SYRINGE) ×2 IMPLANT
SYR CONTROL 10ML LL (SYRINGE) ×2 IMPLANT
TRAY DSU PREP LF (CUSTOM PROCEDURE TRAY) ×2 IMPLANT
TUBE IRRIGATION SET MISONIX (TUBING) IMPLANT
UNDERPAD 30X36 HEAVY ABSORB (UNDERPADS AND DIAPERS) ×2 IMPLANT
YANKAUER SUCT BULB TIP NO VENT (SUCTIONS) ×1 IMPLANT

## 2020-05-29 NOTE — Discharge Instructions (Signed)
  After Surgery Instructions   1) If you are recuperating from surgery anywhere other than home, please be sure to leave us the number where you can be reached.  2) Go directly home and rest.  3) Keep the operated foot(feet) elevated six inches above the hip when sitting or lying down. This will help control swelling and pain.  4) Support the elevated foot and leg with pillows. DO NOT PLACE PILLOWS UNDER THE KNEE.  5) DO NOT REMOVE or get your bandages WET, unless you were given different instructions by your doctor to do so. This increases the risk of infection.  6) Wear your surgical shoe or surgical boot at all times when you are up on your feet.  7) A limited amount of pain and swelling may occur. The skin may take on a bruised appearance. DO NOT BE ALARMED, THIS IS NORMAL.  8) For slight pain and swelling, apply an ice pack directly over the bandages for 15 minutes only out of each hour of the day. Continue until seen in the office for your first post op visit. DO NOT APPLY ANY FORM OF HEAT TO THE AREA.  9) Have prescriptions filled immediately and take as directed.  10) Drink lots of liquids, water and juice to stay hydrated.  11) CALL IMMEDIATELY IF:  *Bleeding continues until the following day of surgery  *Pain increases and/or does not respond to medication  *Bandages or cast appears to tight  *If your bandage gets wet  *Trip, fall or stump your surgical foot  *If your temperature goes above 101  *If you have ANY questions at all  12) You are expected to be weightbearing after your surgery.   If you need to reach the nurse for any reason, please call: [place]: [phone] [place]: [phone] [place]: [phone]   Post Anesthesia Home Care Instructions  Activity: Get plenty of rest for the remainder of the day. A responsible adult should stay with you for 24 hours following the procedure.  For the next 24 hours, DO NOT: -Drive a  car -Operate machinery -Drink alcoholic beverages -Take any medication unless instructed by your physician -Make any legal decisions or sign important papers.  Meals: Start with liquid foods such as gelatin or soup. Progress to regular foods as tolerated. Avoid greasy, spicy, heavy foods. If nausea and/or vomiting occur, drink only clear liquids until the nausea and/or vomiting subsides. Call your physician if vomiting continues.  Special Instructions/Symptoms: Your throat may feel dry or sore from the anesthesia or the breathing tube placed in your throat during surgery. If this causes discomfort, gargle with warm salt water. The discomfort should disappear within 24 hours.  If you had a scopolamine patch placed behind your ear for the management of post- operative nausea and/or vomiting:  1. The medication in the patch is effective for 72 hours, after which it should be removed.  Wrap patch in a tissue and discard in the trash. Wash hands thoroughly with soap and water. 2. You may remove the patch earlier than 72 hours if you experience unpleasant side effects which may include dry mouth, dizziness or visual disturbances. 3. Avoid touching the patch. Wash your hands with soap and water after contact with the patch.    

## 2020-05-29 NOTE — Telephone Encounter (Signed)
   Patient states medication should have been sent to [place]

## 2020-05-29 NOTE — Addendum Note (Signed)
Addended by: Lauralee Evener C on: 05/29/2020 10:53 AM   Modules accepted: Orders

## 2020-05-29 NOTE — Interval H&P Note (Signed)
History and Physical Interval Note:  05/29/2020 2:59 PM  Katherine Herrera  has presented today for surgery, with the diagnosis of DIABETIC ULCHER Yoder.  The various methods of treatment have been discussed with the patient and family. After consideration of risks, benefits and other options for treatment, the patient has consented to  Procedure(s): RIGHT FOOT WOUND DEBRIDEMENT AND IRRIGATION (Right) BONE BIOPSY (Right) as a surgical intervention.  The patient's history has been reviewed, patient examined, no change in status, stable for surgery.  I have reviewed the patient's chart and labs.  Questions were answered to the patient's satisfaction.     Evelina Bucy

## 2020-05-29 NOTE — Anesthesia Postprocedure Evaluation (Signed)
Anesthesia Post Note  Patient: Katherine Herrera  Procedure(s) Performed: RIGHT FOOT WOUND DEBRIDEMENT AND IRRIGATION (Right Foot) BONE BIOPSY (Right Foot)     Patient location during evaluation: PACU Anesthesia Type: MAC Level of consciousness: awake and alert Pain management: pain level controlled Vital Signs Assessment: post-procedure vital signs reviewed and stable Respiratory status: spontaneous breathing, nonlabored ventilation, respiratory function stable and patient connected to nasal cannula oxygen Cardiovascular status: stable and blood pressure returned to baseline Postop Assessment: no apparent nausea or vomiting Anesthetic complications: no   No complications documented.  Last Vitals:  Vitals:   05/29/20 1645 05/29/20 1700  BP: 105/65 (!) 105/63  Pulse: 63 65  Resp: 14 12  Temp:    SpO2: 92% 93%         RLE Motor Response: Purposeful movement;Responds to commands (05/29/20 1700) RLE Sensation: Decreased;Pain;No numbness (05/29/20 1700)      Effie Berkshire

## 2020-05-29 NOTE — Anesthesia Preprocedure Evaluation (Addendum)
Anesthesia Evaluation  Patient identified by MRN, date of birth, ID band Patient awake    Reviewed: Allergy & Precautions, NPO status , Patient's Chart, lab work & pertinent test results  History of Anesthesia Complications Negative for: history of anesthetic complications  Airway Mallampati: III  TM Distance: >3 FB Neck ROM: Full    Dental no notable dental hx.    Pulmonary COPD, Current SmokerPatient did not abstain from smoking.,    Pulmonary exam normal        Cardiovascular hypertension, Pt. on medications Normal cardiovascular exam  RBBB   Neuro/Psych  Headaches, Anxiety S/P cervical fusion    GI/Hepatic negative GI ROS, Neg liver ROS,   Endo/Other  diabetes, Type 2, Insulin DependentHypothyroidism   Renal/GU Renal InsufficiencyRenal disease  negative genitourinary   Musculoskeletal  (+) Arthritis ,   Abdominal   Peds  Hematology negative hematology ROS (+)   Anesthesia Other Findings Day of surgery medications reviewed with patient.  Reproductive/Obstetrics negative OB ROS                           Anesthesia Physical Anesthesia Plan  ASA: III  Anesthesia Plan: MAC   Post-op Pain Management:    Induction:   PONV Risk Score and Plan: 1 and Treatment may vary due to age or medical condition, Ondansetron, Propofol infusion and Midazolam  Airway Management Planned: Natural Airway and Simple Face Mask  Additional Equipment: None  Intra-op Plan:   Post-operative Plan:   Informed Consent: I have reviewed the patients History and Physical, chart, labs and discussed the procedure including the risks, benefits and alternatives for the proposed anesthesia with the patient or authorized representative who has indicated his/her understanding and acceptance.       Plan Discussed with: CRNA  Anesthesia Plan Comments: (Drank clear liquids today until 13:15, will proceed at or  after 15:15. Daiva Huge, MD)       Anesthesia Quick Evaluation

## 2020-05-29 NOTE — Telephone Encounter (Signed)
Pt states that she only received prescription for [facility]codone [facility] the previous time she received it but [facility]

## 2020-05-29 NOTE — Op Note (Signed)
  Patient Name: Katherine Herrera DOB: 1966/01/08  MRN: [id number]   Date of Surgery: 05/29/2020  Surgeon: Dr. Hardie Pulley, DPM Assistants: None  Pre-operative Diagnosis:  Diabetic ulcer right foot Osteomyelitis Post-operative Diagnosis:  Same Procedures:  1) Bone Biopsy Cuboid Right  2) Bone Biopsy 5th Metatarsal Right  3) Wound Debridement Right Foot Pathology/Specimens: ID Type Source Tests Collected by Time Destination  1 : right cuboid bone Tissue PATH Bone biopsy SURGICAL PATHOLOGY Evelina Bucy, DPM 05/29/2020 1530   2 : fifth metatarsal bone Tissue PATH Bone biopsy SURGICAL PATHOLOGY Evelina Bucy, DPM 05/29/2020 1541   A : right cuboid bone Tissue Bone AEROBIC/ANAEROBIC CULTURE (SURGICAL/DEEP WOUND) Evelina Bucy, DPM 05/29/2020 1537   B : 5th metatarsal  Tissue Bone AEROBIC/ANAEROBIC CULTURE (SURGICAL/DEEP WOUND) Evelina Bucy, DPM 05/29/2020 1540    Anesthesia: MAC/local Hemostasis: * No tourniquets in log * Estimated Blood Loss: 10 mL Materials: * No implants in log * Medications: None Complications: None  Indications for Procedure:  This is a 54 y.o. female with a chronic right foot ulcer. MRI suggested OM of the cuboid. Bone biopsy was indicated to eval for osteomyelitis and to see which pathogens were involved for possible further therapy. All risks, benefits, and alternatives of surgery were discussed. No guarantees were given.   Procedure in Detail: Patient was identified in pre-operative holding area. Formal consent was signed and the right lower extremity was marked. Patient was brought back to the operating room. Anesthesia was induced. The extremity was prepped and draped in the usual sterile fashion. Timeout was taken to confirm patient name, laterality, and procedure prior to incision.   Attention was then directed to the right lateral foot. Fluoroscopy was used to localize the affected area of the cuboid indicated on MRI as well as the 5th  metatarsal bone superficial to the ulceration.  The area over the cuboid was incised with a 15 blade. Blunt dissection was carried down to the bone. A jamshidi needle was used to take a large bone plug from the cuboid bone. This was split for path and micro.  The same procedure was followed for the 5th metatarsal bone. New instrumentation including a new Jamshidi needle was used to avoid cross-contamination.  The wounds were irrigated and closed with 4-0 nylon  Attention was then directed to the plantar foot wound. The wound initially measured 1.5 x1. The wound was debrided of excess hyperkeratosis with a 15 blade. The central area of the wound was then sharply excisionally debrided with a 15 blade down to bleeding viable soft tissue. Debridement was carried down to fascia. The wound was additionally explored with a hemostat to ensure there was no periwound abscess. There was no abscess or deep fluid collection able to be expressed.  The wounds were then cleansed. The foot was then dressed with xeroform, 4x4, kerlix and ACE bandage. Patient tolerated the procedure well.   Disposition: Following a period of post-operative monitoring, patient will be transferred home.

## 2020-05-29 NOTE — Transfer of Care (Signed)
Immediate Anesthesia Transfer of Care Note  Patient: Katherine Herrera  Procedure(s) Performed: Procedure(s) (LRB): RIGHT FOOT WOUND DEBRIDEMENT AND IRRIGATION (Right) BONE BIOPSY (Right)  Patient Location: PACU  Anesthesia Type: MAC  Level of Consciousness: awake, alert , oriented and patient cooperative  Airway & Oxygen Therapy: Patient Spontanous Breathing and Patient connected to face mask oxygen  Post-op Assessment: Report given to PACU RN and Post -op Vital signs reviewed and stable  Post vital signs: Reviewed and stable  Complications: No apparent anesthesia complications Vitals Value Taken Time  BP 108/61 05/29/20 1604  Temp 36.3 C 05/29/20 1604  Pulse 68 05/29/20 1607  Resp 12 05/29/20 1607  SpO2 99 % 05/29/20 1607  Vitals shown include unvalidated device data.  Last Pain:  Vitals:   05/29/20 1604  TempSrc:   PainSc: (P) 10-Worst pain ever      Patients Stated Pain Goal: 4 (91/22/58 3462)  Complications: No complications documented.

## 2020-06-01 ENCOUNTER — Encounter (HOSPITAL_BASED_OUTPATIENT_CLINIC_OR_DEPARTMENT_OTHER): Payer: Self-pay | Admitting: Podiatry

## 2020-06-01 LAB — SURGICAL PATHOLOGY

## 2020-06-02 NOTE — Telephone Encounter (Signed)
    Patient requesting medication be sent to [facility]

## 2020-06-03 LAB — AEROBIC/ANAEROBIC CULTURE W GRAM STAIN (SURGICAL/DEEP WOUND)
Culture: NO GROWTH
Culture: NO GROWTH
Gram Stain: NONE SEEN
Gram Stain: NONE SEEN

## 2020-06-03 NOTE — Telephone Encounter (Signed)
Request has been sent to Dr. Alain Marion and awaiting his approval

## 2020-06-05 ENCOUNTER — Ambulatory Visit: Payer: Medicare Other | Admitting: Podiatry

## 2020-06-05 ENCOUNTER — Other Ambulatory Visit: Payer: Self-pay | Admitting: Internal Medicine

## 2020-06-05 NOTE — Telephone Encounter (Signed)
Pt has been prescribed medication at hospital visit

## 2020-06-05 NOTE — Telephone Encounter (Signed)
New message:   1.Medication Requested: oxyCODONE 20 MG TABS 2. Pharmacy (Name, Street, [place]): [id number] [place] [place] 3. On Med List: yes  4. Last Visit with PCP: 04/21/20  5. Next visit date with PCP: 07/23/20   Agent: Please be advised that RX refills may take up to 3 business days. We ask that you follow-up with your pharmacy.

## 2020-06-05 NOTE — Telephone Encounter (Signed)
[place] Controlled Database Checked Last filled: 05/29/2020 (20) LOV w/you: 04/21/2020 Next appt w/you: 07/23/2020

## 2020-06-09 ENCOUNTER — Ambulatory Visit (INDEPENDENT_AMBULATORY_CARE_PROVIDER_SITE_OTHER): Payer: Medicare Other | Admitting: Podiatry

## 2020-06-09 ENCOUNTER — Other Ambulatory Visit: Payer: Self-pay

## 2020-06-09 DIAGNOSIS — L929 Granulomatous disorder of the skin and subcutaneous tissue, unspecified: Secondary | ICD-10-CM | POA: Diagnosis not present

## 2020-06-09 DIAGNOSIS — H524 Presbyopia: Secondary | ICD-10-CM | POA: Diagnosis not present

## 2020-06-09 DIAGNOSIS — H52221 Regular astigmatism, right eye: Secondary | ICD-10-CM | POA: Diagnosis not present

## 2020-06-09 MED ORDER — OXYCODONE HCL 20 MG PO TABS: 20.0000 mg | ORAL_TABLET | Freq: Four times a day (QID) | ORAL | 0 refills | Status: DC | PRN

## 2020-06-09 NOTE — Progress Notes (Signed)
  Subjective:  Patient ID: Katherine Herrera, female    DOB: 1966/07/28,  MRN: [id number]  Chief Complaint  Patient presents with  . Routine Post Op    POV#1 DOS 7.30.2021 RT FOOT WOUND DEBRIDEMENT & IRRIGATION, BONE BIOPSY. Pt states no concerns. Denies fever/nausea/vomiting/chills.    54 y.o. female presents for wound care. Hx confirmed with patient.  Objective:  Physical Exam: Wound Location: right 5th met base Wound Measurement: 1.5x2 Wound Base: Granular/Healthy Peri-wound: HPKotic Exudate: Scant/small amount Serosanguinous exudate wound without warmth, erythema, signs of acute infection  Assessment:   1. Granulation tissue    Plan:  Patient was evaluated and treated and all questions answered.  Ulcer Right foot -Wound cleansed, debrided, hypergranular tissue debrided and cauterized with silver nitrate -Dressed with mepilex border dressing.  S/p Bone Biopsy -Reviewed results with patient. Biopsies x2 negative on both path and micro.  Return in about 1 week (around 06/16/2020) for Post-Op (No XRs).

## 2020-06-16 ENCOUNTER — Telehealth: Payer: Self-pay | Admitting: Emergency Medicine

## 2020-06-16 ENCOUNTER — Ambulatory Visit: Payer: Medicare Other | Admitting: Podiatry

## 2020-06-16 ENCOUNTER — Ambulatory Visit (INDEPENDENT_AMBULATORY_CARE_PROVIDER_SITE_OTHER): Payer: Medicare Other | Admitting: Podiatry

## 2020-06-16 ENCOUNTER — Other Ambulatory Visit: Payer: Self-pay

## 2020-06-16 DIAGNOSIS — L929 Granulomatous disorder of the skin and subcutaneous tissue, unspecified: Secondary | ICD-10-CM | POA: Diagnosis not present

## 2020-06-16 NOTE — Progress Notes (Signed)
  Subjective:  Patient ID: Katherine Herrera, female    DOB: 1966/01/08,  MRN: [id number]  Chief Complaint  Patient presents with  . Routine Post Op    POV#2 Pt states increased pain over past 4 days and also yellow drainage over past 2 days. Denies fever/chills/nausea/vomiting.    54 y.o. female presents for wound care. Hx confirmed with patient.  Objective:  Physical Exam: Wound Location: right 5th met base Wound Measurement: 2x2x-0.1 Wound Base: hypergranular Peri-wound: HPKotic Exudate: Scant/small amount Serosanguinous exudate wound without warmth, erythema, signs of acute infection  Assessment:   1. Granulation tissue    Plan:  Patient was evaluated and treated and all questions answered.  Ulcer Right foot -Wound cleansed, debrided, hypergranular tissue excised and cauterized with silver nitrate.   No follow-ups on file.

## 2020-06-16 NOTE — Telephone Encounter (Signed)
Pt is requesting a refill on her Oxycodone HCl 15 MG TABS. She stated the 20 MG are too strong. [facility] [place]. Thanks.

## 2020-06-18 ENCOUNTER — Encounter: Payer: Self-pay | Admitting: Genetic Counselor

## 2020-06-18 NOTE — Telephone Encounter (Signed)
Take 10 mg for now OV to change dosage Thx

## 2020-06-22 NOTE — Telephone Encounter (Signed)
Sandralee must have OV q 3 months Thx

## 2020-06-22 NOTE — Telephone Encounter (Signed)
Pt is requesting oxycodone 15mg  that was last prescribed 05/22/20.  She is having back pain. Last OV 12/20/19 Next OV 07/23/20

## 2020-06-24 NOTE — Telephone Encounter (Signed)
Pt's last OV was 04/21/2020

## 2020-06-25 ENCOUNTER — Telehealth: Payer: Self-pay | Admitting: Internal Medicine

## 2020-06-25 NOTE — Telephone Encounter (Signed)
    1.Medication Requested: baclofen (LIORESAL) 10 MG tablet  2. Pharmacy (Name, Street, [place]): [facility]  3. On Med List: YES  4. Last Visit with PCP: 04/21/20  5. Next visit date with PCP: 07/23/20   Agent: Please be advised that RX refills may take up to 3 business days. We ask that you follow-up with your pharmacy.

## 2020-06-25 NOTE — Telephone Encounter (Signed)
Follow up message   Patient checking status of refill

## 2020-06-26 MED ORDER — BACLOFEN 10 MG PO TABS: 20.0000 mg | ORAL_TABLET | Freq: Two times a day (BID) | ORAL | 1 refills | Status: DC

## 2020-06-26 NOTE — Telephone Encounter (Signed)
Called pharmacy that states they have one more refill on file for pt and they were going to send it to pt and I also sent in a new Rx for pt. Pt informed as well

## 2020-06-29 DIAGNOSIS — E1165 Type 2 diabetes mellitus with hyperglycemia: Secondary | ICD-10-CM | POA: Diagnosis not present

## 2020-06-29 DIAGNOSIS — E039 Hypothyroidism, unspecified: Secondary | ICD-10-CM | POA: Diagnosis not present

## 2020-06-29 DIAGNOSIS — E1140 Type 2 diabetes mellitus with diabetic neuropathy, unspecified: Secondary | ICD-10-CM | POA: Diagnosis not present

## 2020-06-29 DIAGNOSIS — I10 Essential (primary) hypertension: Secondary | ICD-10-CM | POA: Diagnosis not present

## 2020-06-29 DIAGNOSIS — E7800 Pure hypercholesterolemia, unspecified: Secondary | ICD-10-CM | POA: Diagnosis not present

## 2020-06-29 NOTE — Telephone Encounter (Signed)
New Message:   Pt is calling and states and is calling to check on the status of her refill. Pt states she wants the 15 mg and not the 20 mg pills. She states what she got on 06/09/20 was only for 20 pills but she states they gave her that due to a mixup of the last time she came into the office. Please advise.

## 2020-06-30 ENCOUNTER — Ambulatory Visit (INDEPENDENT_AMBULATORY_CARE_PROVIDER_SITE_OTHER): Payer: Medicare Other | Admitting: Podiatry

## 2020-06-30 ENCOUNTER — Other Ambulatory Visit: Payer: Self-pay

## 2020-06-30 DIAGNOSIS — L97413 Non-pressure chronic ulcer of right heel and midfoot with necrosis of muscle: Secondary | ICD-10-CM

## 2020-06-30 DIAGNOSIS — L929 Granulomatous disorder of the skin and subcutaneous tissue, unspecified: Secondary | ICD-10-CM

## 2020-06-30 DIAGNOSIS — E08621 Diabetes mellitus due to underlying condition with foot ulcer: Secondary | ICD-10-CM

## 2020-06-30 NOTE — Progress Notes (Signed)
  Subjective:  Patient ID: Katherine Herrera, female    DOB: 1966/01/17,  MRN: [id number]  Chief Complaint  Patient presents with  . Routine Post Op    POV#3 Pt denies fever/chills/nausea/vomiting and states her pain is manageable.    54 y.o. female presents for wound care. Hx confirmed with patient.  Objective:  Physical Exam: Wound Location: right 5th met base Wound Measurement: 2x1.5 Wound Base: hypergranular Peri-wound: HPKotic Exudate: Scant/small amount Serosanguinous exudate wound without warmth, erythema, signs of acute infection  Assessment:   1. Granulation tissue   2. 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  Ulcer Right foot -Wound improving, slowly. Cauterized with silver nitrate. -Should wound fail to progress would consider surgical application of skin graft substitute.  No follow-ups on file.

## 2020-07-01 NOTE — Telephone Encounter (Signed)
Called pt and moved her appointment with PCP to 9/16 at 11:20a . Suggested pt see a different provider in the meantime for med request. Pt declined

## 2020-07-03 ENCOUNTER — Other Ambulatory Visit: Payer: Self-pay | Admitting: Podiatry

## 2020-07-03 MED ORDER — SILVER SULFADIAZINE 1 % EX CREA: TOPICAL_CREAM | CUTANEOUS | 0 refills | Status: DC

## 2020-07-03 NOTE — Progress Notes (Signed)
Rx for silvadene sent

## 2020-07-08 ENCOUNTER — Other Ambulatory Visit: Payer: Self-pay | Admitting: Internal Medicine

## 2020-07-09 ENCOUNTER — Other Ambulatory Visit: Payer: Self-pay | Admitting: Internal Medicine

## 2020-07-16 ENCOUNTER — Other Ambulatory Visit: Payer: Self-pay

## 2020-07-16 ENCOUNTER — Encounter: Payer: Self-pay | Admitting: Internal Medicine

## 2020-07-16 ENCOUNTER — Ambulatory Visit (INDEPENDENT_AMBULATORY_CARE_PROVIDER_SITE_OTHER): Payer: Medicare Other | Admitting: Internal Medicine

## 2020-07-16 VITALS — BP 90/52 | HR 82 | Temp 98.3°F | Ht 69.5 in | Wt 219.0 lb

## 2020-07-16 DIAGNOSIS — E034 Atrophy of thyroid (acquired): Secondary | ICD-10-CM

## 2020-07-16 DIAGNOSIS — E1140 Type 2 diabetes mellitus with diabetic neuropathy, unspecified: Secondary | ICD-10-CM

## 2020-07-16 DIAGNOSIS — L97508 Non-pressure chronic ulcer of other part of unspecified foot with other specified severity: Secondary | ICD-10-CM

## 2020-07-16 DIAGNOSIS — M5440 Lumbago with sciatica, unspecified side: Secondary | ICD-10-CM

## 2020-07-16 DIAGNOSIS — E11621 Type 2 diabetes mellitus with foot ulcer: Secondary | ICD-10-CM

## 2020-07-16 DIAGNOSIS — E538 Deficiency of other specified B group vitamins: Secondary | ICD-10-CM | POA: Diagnosis not present

## 2020-07-16 MED ORDER — CLONAZEPAM 0.5 MG PO TABS: 0.5000 mg | ORAL_TABLET | Freq: Two times a day (BID) | ORAL | 1 refills | Status: DC | PRN

## 2020-07-16 MED ORDER — OXYCODONE HCL 15 MG PO TABS: 15.0000 mg | ORAL_TABLET | Freq: Four times a day (QID) | ORAL | 0 refills | Status: DC | PRN

## 2020-07-16 NOTE — Assessment & Plan Note (Signed)
F/u w/Dr March Rummage

## 2020-07-16 NOTE — Progress Notes (Signed)
Subjective:  Patient ID: Katherine Herrera, female    DOB: 1966-08-17  Age: 54 y.o. MRN: [id number]  CC: No chief complaint on file.   HPI Katherine Herrera presents for chronic pain - needs to reduce Oxy to 15 mg/d F/u DM, anxiety, foot ulcers   Outpatient Medications Prior to Visit  Medication Sig Dispense Refill  . aspirin (BAYER ASPIRIN) 325 MG tablet Take 1 tablet (325 mg total) by mouth daily. 100 tablet 3  . B-D INS SYR ULTRAFINE 1CC/31G 31G X 5/16" 1 ML MISC USE AS DIRECTED 100 each 0  . baclofen (LIORESAL) 10 MG tablet Take 2 tablets (20 mg total) by mouth 2 (two) times daily. 180 tablet 1  . bisacodyl (DULCOLAX) 5 MG EC tablet Take 10 mg by mouth at bedtime.    . Blood Glucose Monitoring Suppl (ONETOUCH VERIO) w/Device KIT     . clindamycin (CLEOCIN) 300 MG capsule Take 1 capsule (300 mg total) by mouth 2 (two) times daily. 14 capsule 0  . clonazePAM (KLONOPIN) 0.5 MG tablet Take 1 tablet (0.5 mg total) by mouth 2 (two) times daily as needed for anxiety. 60 tablet 1  . ezetimibe (ZETIA) 10 MG tablet TAKE 1 TABLET BY MOUTH  DAILY 90 tablet 3  . fluticasone (FLONASE) 50 MCG/ACT nasal spray Place 2 sprays into both nostrils daily. (Patient taking differently: Place 2 sprays into both nostrils daily as needed. ) 16 g 6  . gabapentin (NEURONTIN) 800 MG tablet TAKE 1 TABLET BY MOUTH 4  TIMES DAILY 360 tablet 3  . glucose blood (ONETOUCH VERIO) test strip Use to check blood sugar 2 times per day. 100 each 12  . insulin NPH-regular Human (70-30) 100 UNIT/ML injection Inject 10 Units into the skin 2 (two) times daily with a meal. (Patient taking differently: Inject into the skin as directed. 05-26-2020  Per pt 50 units an am and evening and at lunch time if cbg >200 it's 10 units) 10 mL 11  . Insulin Syringe-Needle U-100 (SURE COMFORT INSULIN SYRINGE) 31G X 5/16" 0.5 ML MISC USE AS DIRECTED WITH INSULIN 4 TIMES A DAY 400 each 2  . lamoTRIgine (LAMICTAL) 25 MG tablet TAKE 1 TABLET BY MOUTH   TWICE DAILY 180 tablet 3  . Lancet Devices (ACCU-CHEK SOFTCLIX) lancets Test two times daily 100 each 12  . Lancets (ONETOUCH DELICA PLUS [id number]) MISC     . levothyroxine (SYNTHROID) 75 MCG tablet TAKE 1 TABLET BY MOUTH  DAILY 90 tablet 3  . lisinopril (ZESTRIL) 10 MG tablet TAKE 1 TABLET BY MOUTH  DAILY (Patient taking differently: Take 10 mg by mouth at bedtime. ) 90 tablet 3  . loratadine (CLARITIN) 10 MG tablet Take 1 tablet (10 mg total) by mouth daily as needed for allergies. 100 tablet 3  . Oxycodone HCl 20 MG TABS Take 1 tablet (20 mg total) by mouth every 6 (six) hours as needed. 20 tablet 0  . rOPINIRole (REQUIP) 0.5 MG tablet TAKE 1 TABLET BY MOUTH AT  BEDTIME 90 tablet 3  . senna-docusate (SENOKOT-S) 8.6-50 MG tablet Take 1 tablet by mouth 2 (two) times daily. (Patient taking differently: Take 1 tablet by mouth at bedtime. ) 20 tablet 0  . silver sulfADIAZINE (SILVADENE) 1 % cream Apply pea-sized amount to wound daily. 50 g 0  . vitamin B-12 (CYANOCOBALAMIN) 1000 MCG tablet Take 1,000 mcg by mouth daily.       No facility-administered medications prior to visit.    ROS:  Review of Systems  Constitutional: Positive for unexpected weight change. Negative for activity change, appetite change, chills and fatigue.  HENT: Negative for congestion, mouth sores and sinus pressure.   Eyes: Negative for visual disturbance.  Respiratory: Negative for cough and chest tightness.   Gastrointestinal: Negative for abdominal pain and nausea.  Genitourinary: Negative for difficulty urinating, frequency and vaginal pain.  Musculoskeletal: Positive for arthralgias, back pain and gait problem.  Skin: Negative for pallor and rash.  Neurological: Negative for dizziness, tremors, weakness, numbness and headaches.  Psychiatric/Behavioral: Negative for confusion, sleep disturbance and suicidal ideas. The patient is nervous/anxious.     Objective:  BP (!) 90/52 (BP Location: Right Arm, Patient  Position: Sitting, Cuff Size: Large)   Pulse 82   Temp 98.3 F (36.8 C) (Oral)   Ht 5' 9.5" (1.765 m)   Wt 219 lb (99.3 kg)   SpO2 95%   BMI 31.88 kg/m   BP Readings from Last 3 Encounters:  07/16/20 (!) 90/52  05/29/20 (!) 130/73  04/21/20 110/60    Wt Readings from Last 3 Encounters:  07/16/20 219 lb (99.3 kg)  05/29/20 (!) 214 lb 14.4 oz (97.5 kg)  04/21/20 215 lb (97.5 kg)    Physical Exam Constitutional:      General: She is not in acute distress.    Appearance: She is well-developed. She is obese.  HENT:     Head: Normocephalic.     Right Ear: External ear normal.     Left Ear: External ear normal.     Nose: Nose normal.  Eyes:     General:        Right eye: No discharge.        Left eye: No discharge.     Conjunctiva/sclera: Conjunctivae normal.     Pupils: Pupils are equal, round, and reactive to light.  Neck:     Thyroid: No thyromegaly.     Vascular: No JVD.     Trachea: No tracheal deviation.  Cardiovascular:     Rate and Rhythm: Normal rate and regular rhythm.     Heart sounds: Normal heart sounds.  Pulmonary:     Effort: No respiratory distress.     Breath sounds: No stridor. No wheezing.  Abdominal:     General: Bowel sounds are normal. There is no distension.     Palpations: Abdomen is soft. There is no mass.     Tenderness: There is no abdominal tenderness. There is no guarding or rebound.  Musculoskeletal:        General: Tenderness present.     Cervical back: Normal range of motion and neck supple.  Lymphadenopathy:     Cervical: No cervical adenopathy.  Skin:    Findings: No erythema or rash.  Neurological:     Mental Status: She is oriented to person, place, and time.     Cranial Nerves: No cranial nerve deficit.     Motor: No abnormal muscle tone.     Coordination: Coordination normal.     Gait: Gait abnormal.     Deep Tendon Reflexes: Reflexes normal.  Psychiatric:        Behavior: Behavior normal.        Thought Content:  Thought content normal.        Judgment: Judgment normal.   R foot in a boot L foot in a surgical shoe LS tender  Lab Results  Component Value Date   WBC 7.5 01/14/2020   HGB 16.3 (H) 05/29/2020  HCT 48.0 (H) 05/29/2020   PLT 425 (H) 01/14/2020   GLUCOSE 253 (H) 05/29/2020   CHOL 223 (H) 07/19/2016   TRIG (H) 07/19/2016    527.0 Triglyceride is over 400; calculations on Lipids are invalid.   HDL 27.40 (L) 07/19/2016   LDLDIRECT 106.0 07/19/2016   LDLCALC 81 01/02/2013   ALT 14 01/11/2020   AST 18 01/11/2020   NA 137 05/29/2020   K 4.6 05/29/2020   CL 98 05/29/2020   CREATININE 0.90 05/29/2020   BUN 21 (H) 05/29/2020   CO2 27 01/15/2020   TSH 1.575 07/20/2019   INR 1.0 07/19/2019   HGBA1C 9.8 (H) 01/10/2020   MICROALBUR 0.9 07/19/2016    No results found.  Assessment & Plan:      Walker Kehr, MD

## 2020-07-16 NOTE — Assessment & Plan Note (Signed)
Chronic OA On Oxycodone 15 mg

## 2020-07-16 NOTE — Assessment & Plan Note (Signed)
On 70/30 insulin

## 2020-07-16 NOTE — Assessment & Plan Note (Signed)
On B12 

## 2020-07-21 ENCOUNTER — Other Ambulatory Visit: Payer: Self-pay

## 2020-07-21 ENCOUNTER — Ambulatory Visit (INDEPENDENT_AMBULATORY_CARE_PROVIDER_SITE_OTHER): Payer: Medicare Other | Admitting: Podiatry

## 2020-07-21 ENCOUNTER — Ambulatory Visit (INDEPENDENT_AMBULATORY_CARE_PROVIDER_SITE_OTHER): Payer: Medicare Other

## 2020-07-21 DIAGNOSIS — E08621 Diabetes mellitus due to underlying condition with foot ulcer: Secondary | ICD-10-CM

## 2020-07-21 DIAGNOSIS — L97413 Non-pressure chronic ulcer of right heel and midfoot with necrosis of muscle: Secondary | ICD-10-CM

## 2020-07-21 DIAGNOSIS — M79671 Pain in right foot: Secondary | ICD-10-CM | POA: Diagnosis not present

## 2020-07-23 ENCOUNTER — Ambulatory Visit: Payer: Medicare Other | Admitting: Internal Medicine

## 2020-07-30 NOTE — Progress Notes (Signed)
  Subjective:  Patient ID: Katherine Herrera, female    DOB: 1966-06-08,  MRN: [id number]  Chief Complaint  Patient presents with  . Routine Post Op    POV #4 DOS 05/29/2020 RT FOOT WOUND DEBRIDEMENT & IRRIGATION, BONE BIOPSY, pt states that her right foot is very painful, pt also states that the pain is elevated to the touch. Pt has been keeping it well bandaged   54 y.o. female presents for wound care. Hx confirmed with patient.  Objective:  Physical Exam: Wound Location: right 5th met base Wound Measurement: 2x2.5 Wound Base: hypergranular Peri-wound: HPKotic Exudate: Scant/small amount Serosanguinous exudate wound without warmth, erythema, signs of acute infection  Assessment:   1. Diabetic ulcer of right midfoot associated with diabetes mellitus due to underlying condition, with necrosis of muscle ([place])   2. Pain in right foot    Plan:  Patient was evaluated and treated and all questions answered.  Ulcer Right foot -X-rays taken and reviewed interval osseous erosions or signs of infection -Wound dressed with Silvadene and dry sterile dressing -Discussed that at some point we may have to consider surgical intervention No follow-ups on file.

## 2020-08-07 ENCOUNTER — Ambulatory Visit: Payer: Medicare Other | Admitting: Podiatry

## 2020-08-11 ENCOUNTER — Ambulatory Visit (INDEPENDENT_AMBULATORY_CARE_PROVIDER_SITE_OTHER): Payer: Medicare Other | Admitting: Podiatry

## 2020-08-11 ENCOUNTER — Other Ambulatory Visit: Payer: Self-pay

## 2020-08-11 DIAGNOSIS — L97413 Non-pressure chronic ulcer of right heel and midfoot with necrosis of muscle: Secondary | ICD-10-CM

## 2020-08-11 DIAGNOSIS — E08621 Diabetes mellitus due to underlying condition with foot ulcer: Secondary | ICD-10-CM | POA: Diagnosis not present

## 2020-08-14 ENCOUNTER — Ambulatory Visit: Payer: Medicare Other | Admitting: Podiatry

## 2020-08-25 ENCOUNTER — Ambulatory Visit (INDEPENDENT_AMBULATORY_CARE_PROVIDER_SITE_OTHER): Payer: Medicare Other | Admitting: Podiatry

## 2020-08-25 ENCOUNTER — Ambulatory Visit: Payer: Medicare Other | Admitting: Podiatry

## 2020-08-25 ENCOUNTER — Other Ambulatory Visit: Payer: Self-pay

## 2020-08-25 DIAGNOSIS — E08621 Diabetes mellitus due to underlying condition with foot ulcer: Secondary | ICD-10-CM | POA: Diagnosis not present

## 2020-08-25 DIAGNOSIS — M79671 Pain in right foot: Secondary | ICD-10-CM | POA: Diagnosis not present

## 2020-08-25 DIAGNOSIS — L97413 Non-pressure chronic ulcer of right heel and midfoot with necrosis of muscle: Secondary | ICD-10-CM

## 2020-08-25 DIAGNOSIS — L929 Granulomatous disorder of the skin and subcutaneous tissue, unspecified: Secondary | ICD-10-CM | POA: Diagnosis not present

## 2020-08-30 NOTE — Progress Notes (Signed)
  Subjective:  Patient ID: Katherine Herrera, female    DOB: [birth year],  MRN: [id number]  Chief Complaint  Patient presents with  . Routine Post Op    Pt states "Feels like fluid under my foot" and has generalized foot pain, primarily plantar. Denies fever/nausea/vomiting/chills.    54 y.o. female presents for wound care. Hx confirmed with patient.  Objective:  Physical Exam: Wound Location: right 5th met base Wound Measurement: 2x1.5 Wound Base: hypergranular Peri-wound: HPKotic Exudate: Scant/small amount Serosanguinous exudate wound without warmth, erythema, signs of acute infection  Assessment:   1. Diabetic ulcer of right midfoot associated with diabetes mellitus due to underlying condition, with necrosis of muscle ([place])    Plan:  Patient was evaluated and treated and all questions answered.  Ulcer Right foot -Wound improving. Debrided as below. Given improvement at this time hold off on surgical intervention. Dressed with Silvadene and DSD today -Continue cam boot  Procedure: Selective Debridement of Wound Rationale: Removal of devitalized tissue from the wound to promote healing.  Pre-Debridement Wound Measurements: 2 cm x 1.5 cm x 0.2 cm  Post-Debridement Wound Measurements: same as pre-debridement. Type of Debridement: sharp selective Tissue Removed: Devitalized soft-tissue Dressing: Dry, sterile, compression dressing. Disposition: Patient tolerated procedure well. Patient to return in 1 week for follow-up.    No follow-ups on file.

## 2020-08-30 NOTE — Progress Notes (Signed)
  Subjective:  Patient ID: Katherine Herrera, female    DOB: 11/05/1965,  MRN: [id number]  Chief Complaint  Patient presents with  . Wound Check    Denies fever/nausea/vomiting/chills. No new concerns.   . Foot Orthotics    Pt has diabetic shoes available for pickup, but needs to get approval from physician before dispensing.   54 y.o. female presents for wound care. Hx confirmed with patient.  Objective:  Physical Exam: Wound Location: right 5th met base Wound Measurement: 2.5 x 1.5 Wound Base: hypergranular Peri-wound: HPKotic Exudate: Scant/small amount Serosanguinous exudate wound without warmth, erythema, signs of acute infection  Assessment:   1. Diabetic ulcer of right midfoot associated with diabetes mellitus due to underlying condition, with necrosis of muscle (HCC)   2. Pain in right foot   3. Granulation tissue    Plan:  Patient was evaluated and treated and all questions answered.  Ulcer Right foot -Wound similar to last visit.  No debridement warranted today.  Cauterized with silver nitrate.  Mepilex border applied over this.  Patient to leave intact for 2 days and then okay to apply Silvadene -Okay for patient to pick up custom shoes  No follow-ups on file.

## 2020-09-02 DIAGNOSIS — E1140 Type 2 diabetes mellitus with diabetic neuropathy, unspecified: Secondary | ICD-10-CM | POA: Diagnosis not present

## 2020-09-02 DIAGNOSIS — I10 Essential (primary) hypertension: Secondary | ICD-10-CM | POA: Diagnosis not present

## 2020-09-02 DIAGNOSIS — E7800 Pure hypercholesterolemia, unspecified: Secondary | ICD-10-CM | POA: Diagnosis not present

## 2020-09-02 DIAGNOSIS — R6889 Other general symptoms and signs: Secondary | ICD-10-CM | POA: Diagnosis not present

## 2020-09-02 DIAGNOSIS — E039 Hypothyroidism, unspecified: Secondary | ICD-10-CM | POA: Diagnosis not present

## 2020-09-02 DIAGNOSIS — E1165 Type 2 diabetes mellitus with hyperglycemia: Secondary | ICD-10-CM | POA: Diagnosis not present

## 2020-09-10 IMAGING — DX DG FOOT COMPLETE 3+V*R*
3 series · 3 of 3 positions shown · non-contrast
Comparison: Left foot x-rays dated September 28, 2018. Right foot
x-rays dated June 01, 2016.

CLINICAL DATA: Status post bilateral fifth metatarsal resections.

EXAM:
LEFT FOOT - COMPLETE 3+ VIEW; RIGHT FOOT COMPLETE - 3+ VIEW

[foot ap]
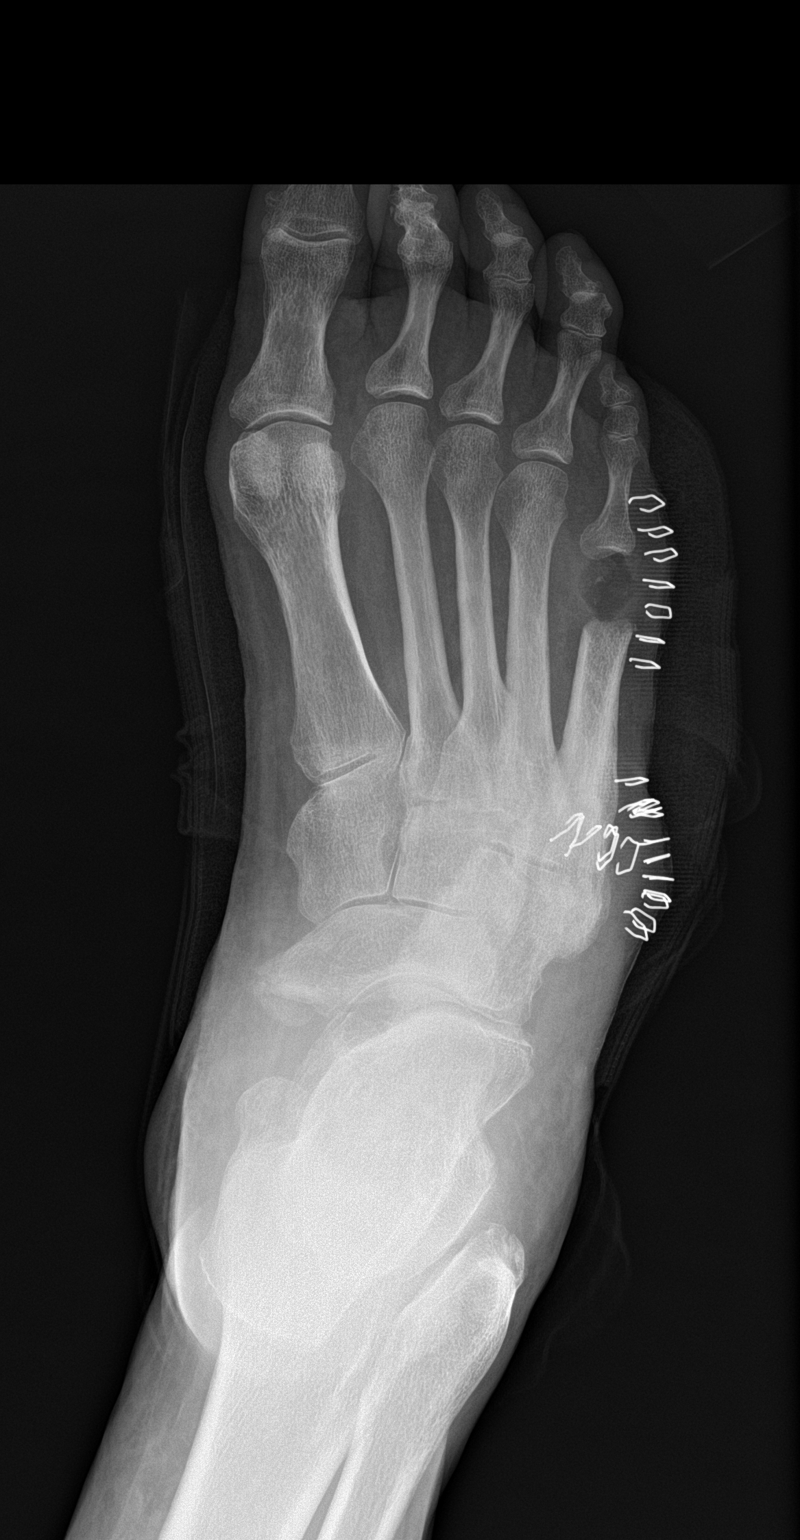

[foot obl]
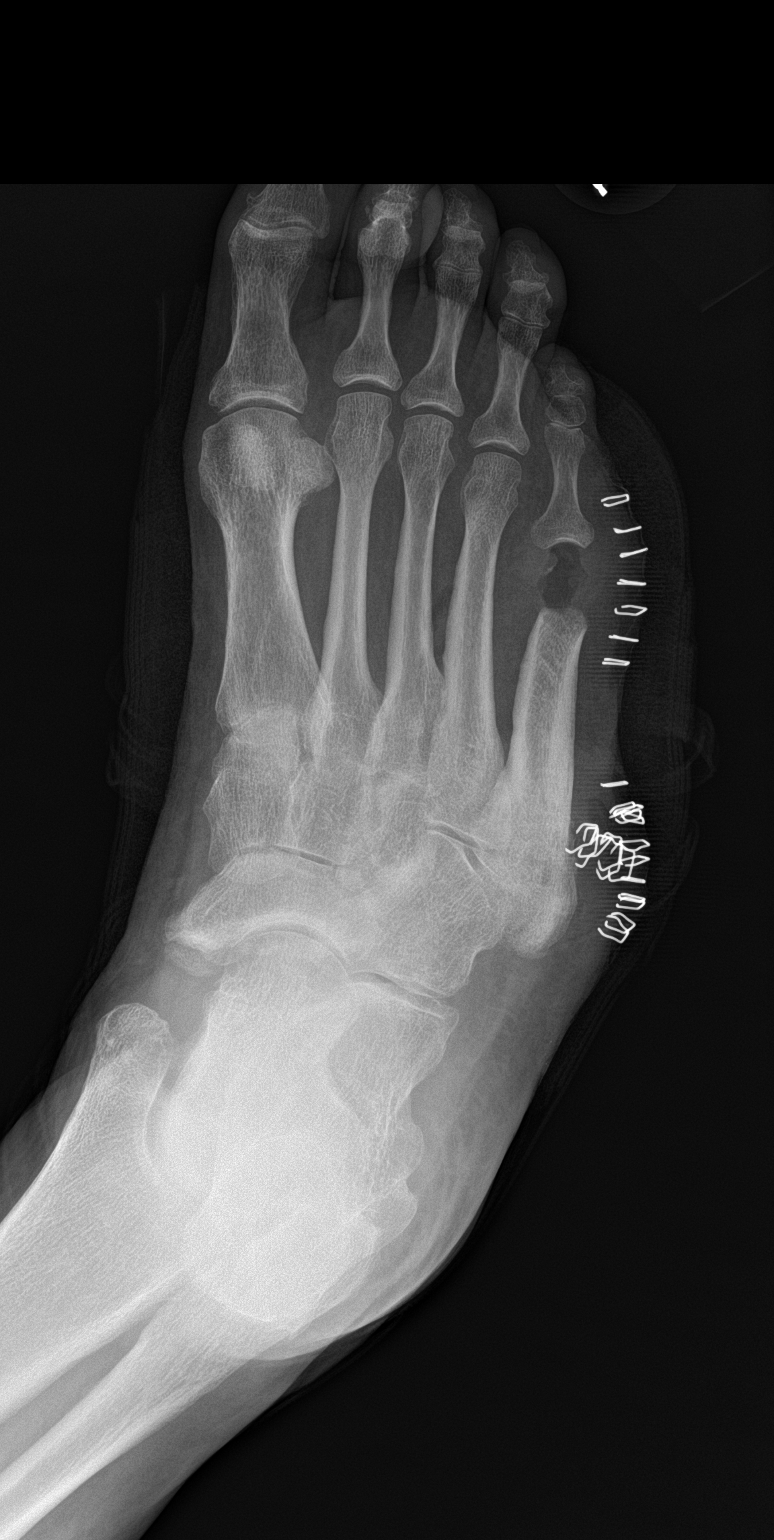

[foot lat]
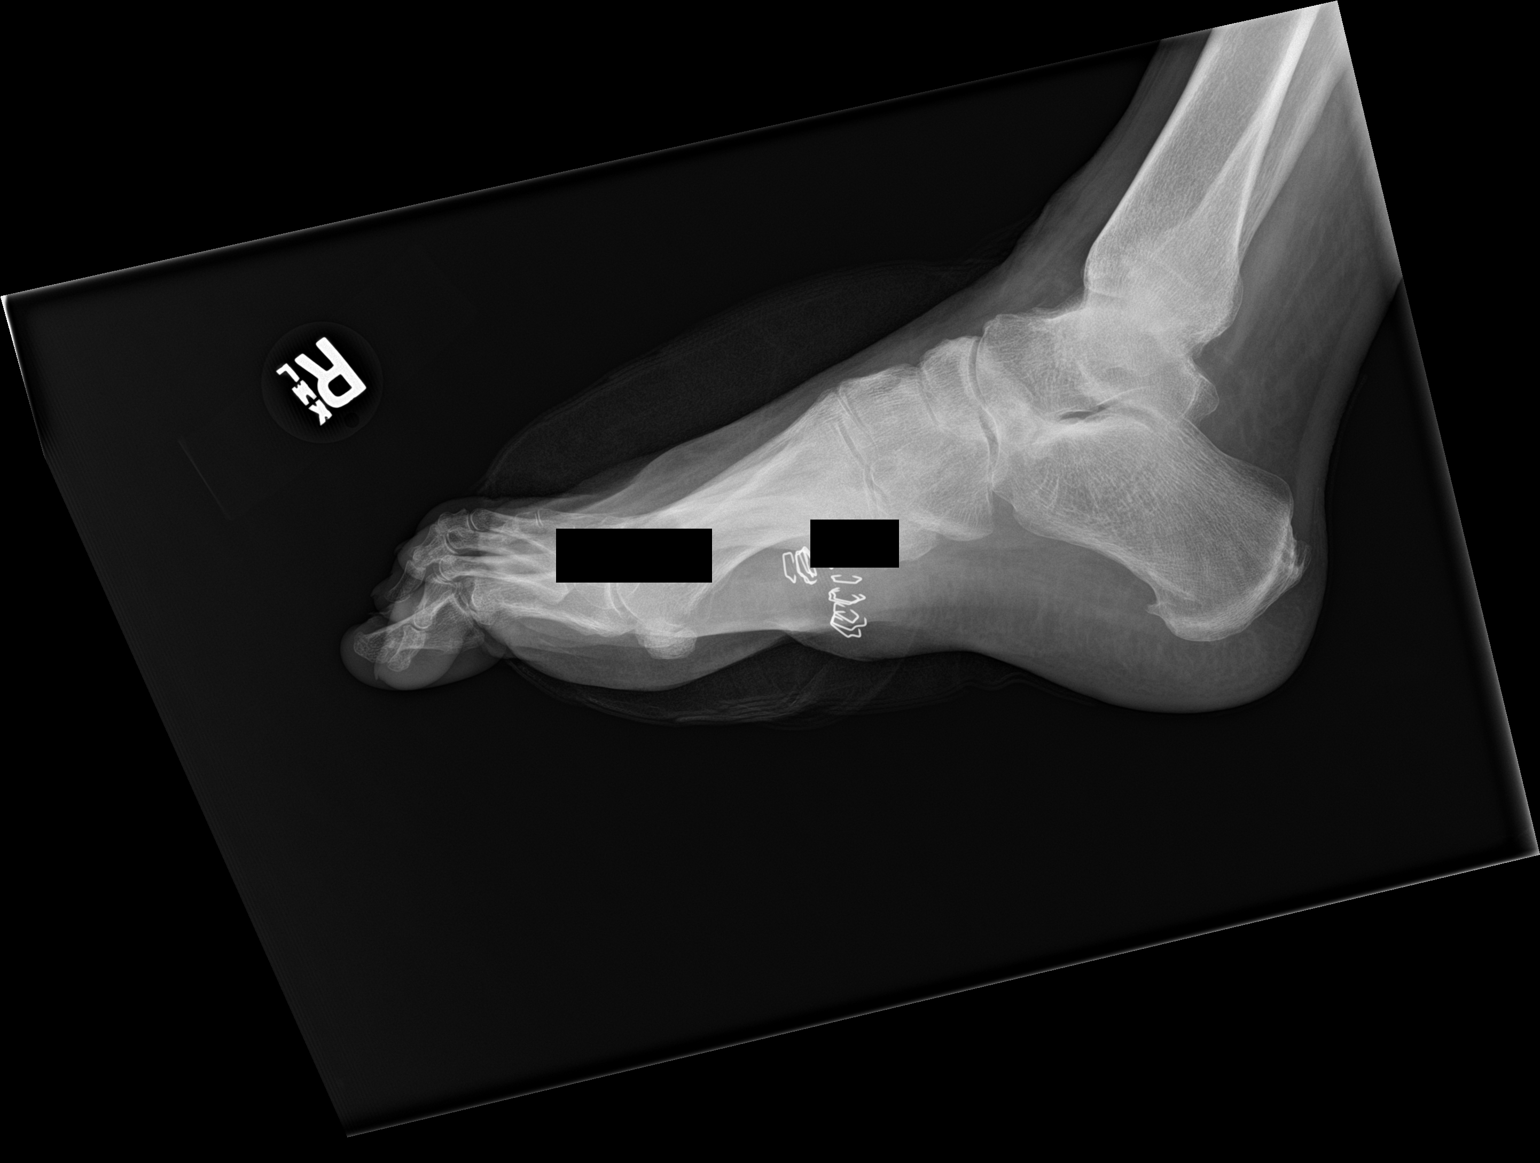

[3 of 3 positions shown; findings below may reference images not displayed]

FINDINGS: Left foot: Interval further resection of the fifth metatarsal shaft.
No acute fracture or dislocation. Joint spaces are preserved. Bone
mineralization is normal. Expected postsurgical changes in the
surrounding soft tissues.

Right foot: Interval resection of the fifth metatarsal head. No
acute fracture or dislocation. Joint spaces are preserved. Bone
mineralization is normal. Expected postsurgical changes in the
surrounding soft tissues.
IMPRESSION: 1. Interval resection of the right fifth metatarsal head and further
resection of the left fifth metatarsal shaft. No acute osseous
abnormality.

## 2020-09-18 ENCOUNTER — Ambulatory Visit: Payer: Medicare Other | Admitting: Podiatry

## 2020-10-09 ENCOUNTER — Other Ambulatory Visit: Payer: Self-pay

## 2020-10-09 ENCOUNTER — Ambulatory Visit (INDEPENDENT_AMBULATORY_CARE_PROVIDER_SITE_OTHER): Payer: Medicare Other | Admitting: Podiatry

## 2020-10-09 VITALS — Temp 99.0°F

## 2020-10-09 DIAGNOSIS — E7800 Pure hypercholesterolemia, unspecified: Secondary | ICD-10-CM | POA: Insufficient documentation

## 2020-10-09 DIAGNOSIS — E08621 Diabetes mellitus due to underlying condition with foot ulcer: Secondary | ICD-10-CM | POA: Diagnosis not present

## 2020-10-09 DIAGNOSIS — L97413 Non-pressure chronic ulcer of right heel and midfoot with necrosis of muscle: Secondary | ICD-10-CM | POA: Diagnosis not present

## 2020-10-09 DIAGNOSIS — L929 Granulomatous disorder of the skin and subcutaneous tissue, unspecified: Secondary | ICD-10-CM | POA: Diagnosis not present

## 2020-10-09 DIAGNOSIS — L03115 Cellulitis of right lower limb: Secondary | ICD-10-CM | POA: Diagnosis not present

## 2020-10-09 DIAGNOSIS — I10 Essential (primary) hypertension: Secondary | ICD-10-CM | POA: Insufficient documentation

## 2020-10-09 DIAGNOSIS — E1165 Type 2 diabetes mellitus with hyperglycemia: Secondary | ICD-10-CM | POA: Insufficient documentation

## 2020-10-09 MED ORDER — DOXYCYCLINE HYCLATE 100 MG PO TABS: 100.0000 mg | ORAL_TABLET | Freq: Two times a day (BID) | ORAL | 0 refills | Status: DC

## 2020-10-09 MED ORDER — SILVER SULFADIAZINE 1 % EX CREA: TOPICAL_CREAM | CUTANEOUS | 0 refills | Status: DC

## 2020-10-09 NOTE — Progress Notes (Signed)
  Subjective:  Patient ID: Katherine Herrera, female    DOB: Feb 03, 1966,  MRN: [id number]  Chief Complaint  Patient presents with  . Leg Problem    Pt states right lower extremity is painful x 2 days and red x 1 day. Denies fever/chills/nausea/vomiting.    54 y.o. female presents for wound care. Hx confirmed with patient.  Objective:  Physical Exam: Wound Location: right 5th met base Wound Measurement: 3x2.5 Wound Base: hypergranular Peri-wound: HPKotic Exudate: Scant/small amount Serosanguinous exudate wound without warmth, erythema, signs of acute infection  Right leg warmth, erythema  Assessment:   1. Granulation tissue   2. Diabetic ulcer of right midfoot associated with diabetes mellitus due to underlying condition, with necrosis of muscle (HCC)   3. Cellulitis of right leg    Plan:  Patient was evaluated and treated and all questions answered.  Ulcer Right foot -Wound slightly larger. Cauterized as below -Plan for tendon transfer once wound progresses further.  Procedure: Chemical Cauterization of Granulation Tissue Rationale: Cauterize granular wound base to promote healing.  Wound Measurements: 3 cm x 2.5 cm x 0.2 cm  Instrumentation: Silver nitrate stick x4 Dressing: Dry, sterile, compression dressing. Disposition: Patient tolerated procedure well. Patient to return in 1 week for follow-up.  Right leg cellulitis -Rx doxcycline. Should this worsen she will need IV abx. Patient verbalized understanding. She will see orthotist on Tuesday for eval and I will pop in and see her to make sure it is not worsening.  No follow-ups on file.

## 2020-10-09 NOTE — Patient Instructions (Signed)
Do not apply Silvadene until Monday (December 13th)

## 2020-10-13 ENCOUNTER — Ambulatory Visit: Payer: Medicare Other | Admitting: Podiatry

## 2020-10-13 ENCOUNTER — Other Ambulatory Visit: Payer: Self-pay

## 2020-10-13 ENCOUNTER — Ambulatory Visit (INDEPENDENT_AMBULATORY_CARE_PROVIDER_SITE_OTHER): Payer: Medicare Other | Admitting: Orthotics

## 2020-10-13 DIAGNOSIS — L97413 Non-pressure chronic ulcer of right heel and midfoot with necrosis of muscle: Secondary | ICD-10-CM

## 2020-10-13 DIAGNOSIS — E1142 Type 2 diabetes mellitus with diabetic polyneuropathy: Secondary | ICD-10-CM

## 2020-10-13 DIAGNOSIS — M21961 Unspecified acquired deformity of right lower leg: Secondary | ICD-10-CM

## 2020-10-13 DIAGNOSIS — L97511 Non-pressure chronic ulcer of other part of right foot limited to breakdown of skin: Secondary | ICD-10-CM

## 2020-10-13 DIAGNOSIS — E08621 Diabetes mellitus due to underlying condition with foot ulcer: Secondary | ICD-10-CM

## 2020-10-13 NOTE — Progress Notes (Signed)
Patient came in today to pick up CUSTOM diabetic shoes and custom inserts to address severe charcot foot deformity RIGHT    Same was well pleased with fit and function.   The patient could ambulate without any discomfort; there were no signs of any quality issues. The foot ortheses offered full contact with plantar surface and contoured the arch well.    The shoes fit well with no heel slippage and areas of pressure concern.   Patient advised to contact us if any problems arise.  Patient also advised on how to report any issues.

## 2020-10-15 ENCOUNTER — Ambulatory Visit: Payer: Medicare Other | Admitting: Internal Medicine

## 2020-10-21 ENCOUNTER — Ambulatory Visit (INDEPENDENT_AMBULATORY_CARE_PROVIDER_SITE_OTHER): Payer: Medicare Other | Admitting: Internal Medicine

## 2020-10-21 ENCOUNTER — Encounter: Payer: Self-pay | Admitting: Internal Medicine

## 2020-10-21 ENCOUNTER — Other Ambulatory Visit: Payer: Self-pay

## 2020-10-21 VITALS — BP 122/78 | HR 72 | Temp 98.5°F | Wt 215.6 lb

## 2020-10-21 DIAGNOSIS — E1140 Type 2 diabetes mellitus with diabetic neuropathy, unspecified: Secondary | ICD-10-CM | POA: Diagnosis not present

## 2020-10-21 DIAGNOSIS — E1142 Type 2 diabetes mellitus with diabetic polyneuropathy: Secondary | ICD-10-CM | POA: Diagnosis not present

## 2020-10-21 DIAGNOSIS — Z23 Encounter for immunization: Secondary | ICD-10-CM

## 2020-10-21 LAB — COMPREHENSIVE METABOLIC PANEL
ALT: 13 U/L (ref 0–35)
AST: 12 U/L (ref 0–37)
Albumin: 3.8 g/dL (ref 3.5–5.2)
Alkaline Phosphatase: 106 U/L (ref 39–117)
BUN: 22 mg/dL (ref 6–23)
CO2: 32 mEq/L (ref 19–32)
Calcium: 9.4 mg/dL (ref 8.4–10.5)
Chloride: 99 mEq/L (ref 96–112)
Creatinine, Ser: 1.04 mg/dL (ref 0.40–1.20)
GFR: 60.77 mL/min (ref >60.00)
Glucose, Bld: 130 mg/dL — ABNORMAL HIGH (ref 70–99)
Potassium: 5.1 mEq/L (ref 3.5–5.1)
Sodium: 135 mEq/L (ref 135–145)
Total Bilirubin: 0.3 mg/dL (ref 0.2–1.2)
Total Protein: 8.2 g/dL (ref 6.0–8.3)

## 2020-10-21 LAB — TSH: TSH: 1.23 u[IU]/mL (ref 0.35–4.50)

## 2020-10-21 LAB — HEMOGLOBIN A1C: Hgb A1c MFr Bld: 9.5 % — ABNORMAL HIGH (ref 4.6–6.5)

## 2020-10-21 MED ORDER — CLONAZEPAM 0.5 MG PO TABS: 0.5000 mg | ORAL_TABLET | Freq: Two times a day (BID) | ORAL | 1 refills | Status: DC | PRN

## 2020-10-21 MED ORDER — OXYCODONE HCL 15 MG PO TABS: 15.0000 mg | ORAL_TABLET | Freq: Four times a day (QID) | ORAL | 0 refills | Status: DC | PRN

## 2020-10-21 NOTE — Patient Instructions (Signed)
   B-complex with Niacin 100 mg    Lion's mane  

## 2020-10-21 NOTE — Progress Notes (Signed)
Subjective:  Patient ID: Katherine Herrera, female    DOB: 08/30/1966  Age: 54 y.o. MRN: [id number]  CC: Follow-up (3 month f/u- Flu shot)   HPI KRISTE BROMAN presents for LBP, DM, HTN, diabetic foot issues B  Outpatient Medications Prior to Visit  Medication Sig Dispense Refill  . aspirin (BAYER ASPIRIN) 325 MG tablet Take 1 tablet (325 mg total) by mouth daily. 100 tablet 3  . B-D INS SYR ULTRAFINE 1CC/31G 31G X 5/16" 1 ML MISC USE AS DIRECTED 100 each 0  . baclofen (LIORESAL) 10 MG tablet Take 2 tablets (20 mg total) by mouth 2 (two) times daily. 180 tablet 1  . bisacodyl (DULCOLAX) 5 MG EC tablet Take 10 mg by mouth at bedtime.    . Blood Glucose Monitoring Suppl (ONETOUCH VERIO) w/Device KIT     . clindamycin (CLEOCIN) 300 MG capsule Take 1 capsule (300 mg total) by mouth 2 (two) times daily. 14 capsule 0  . clonazePAM (KLONOPIN) 0.5 MG tablet Take 1 tablet (0.5 mg total) by mouth 2 (two) times daily as needed for anxiety. 60 tablet 1  . doxycycline (VIBRA-TABS) 100 MG tablet Take 1 tablet (100 mg total) by mouth 2 (two) times daily. 20 tablet 0  . ezetimibe (ZETIA) 10 MG tablet TAKE 1 TABLET BY MOUTH  DAILY 90 tablet 3  . fluticasone (FLONASE) 50 MCG/ACT nasal spray Place 2 sprays into both nostrils daily. (Patient taking differently: Place 2 sprays into both nostrils daily as needed.) 16 g 6  . gabapentin (NEURONTIN) 800 MG tablet TAKE 1 TABLET BY MOUTH 4  TIMES DAILY 360 tablet 3  . glucose blood (ONETOUCH VERIO) test strip Use to check blood sugar 2 times per day. 100 each 12  . insulin NPH-regular Human (70-30) 100 UNIT/ML injection Inject 10 Units into the skin 2 (two) times daily with a meal. (Patient taking differently: Inject into the skin as directed. 05-26-2020  Per pt 50 units an am and evening and at lunch time if cbg >200 it's 10 units) 10 mL 11  . Insulin Syringe-Needle U-100 (SURE COMFORT INSULIN SYRINGE) 31G X 5/16" 0.5 ML MISC USE AS DIRECTED WITH INSULIN 4 TIMES A  DAY 400 each 2  . lamoTRIgine (LAMICTAL) 25 MG tablet TAKE 1 TABLET BY MOUTH  TWICE DAILY 180 tablet 3  . Lancet Devices (ACCU-CHEK SOFTCLIX) lancets Test two times daily 100 each 12  . Lancets (ONETOUCH DELICA PLUS [id number]) MISC     . levothyroxine (SYNTHROID) 75 MCG tablet TAKE 1 TABLET BY MOUTH  DAILY 90 tablet 3  . lisinopril (ZESTRIL) 10 MG tablet TAKE 1 TABLET BY MOUTH  DAILY (Patient taking differently: Take 10 mg by mouth at bedtime.) 90 tablet 3  . loratadine (CLARITIN) 10 MG tablet Take 1 tablet (10 mg total) by mouth daily as needed for allergies. 100 tablet 3  . ondansetron (ZOFRAN) 4 MG tablet 1 tablet    . oxyCODONE (ROXICODONE) 15 MG immediate release tablet Take 1 tablet (15 mg total) by mouth every 6 (six) hours as needed. 120 tablet 0  . rOPINIRole (REQUIP) 0.5 MG tablet TAKE 1 TABLET BY MOUTH AT  BEDTIME 90 tablet 3  . senna-docusate (SENOKOT-S) 8.6-50 MG tablet Take 1 tablet by mouth 2 (two) times daily. (Patient taking differently: Take 1 tablet by mouth at bedtime.) 20 tablet 0  . silver sulfADIAZINE (SILVADENE) 1 % cream Apply pea-sized amount to wound daily. 50 g 0  . traMADol (ULTRAM) 50 MG  tablet 1 tablet as needed    . vitamin B-12 (CYANOCOBALAMIN) 1000 MCG tablet Take 1,000 mcg by mouth daily.    Marland Kitchen zolpidem (AMBIEN) 10 MG tablet 1 tablet at bedtime as needed    . oxyCODONE (ROXICODONE) 15 MG immediate release tablet Take 1 tablet (15 mg total) by mouth every 6 (six) hours as needed. 120 tablet 0  . oxyCODONE (ROXICODONE) 15 MG immediate release tablet Take 1 tablet (15 mg total) by mouth every 6 (six) hours as needed for pain. 120 tablet 0   No facility-administered medications prior to visit.    ROS: Review of Systems  Constitutional: Positive for fatigue. Negative for activity change, appetite change, chills and unexpected weight change.  HENT: Negative for congestion, mouth sores and sinus pressure.   Eyes: Negative for visual disturbance.  Respiratory:  Negative for cough and chest tightness.   Gastrointestinal: Negative for abdominal pain and nausea.  Genitourinary: Negative for difficulty urinating, frequency and vaginal pain.  Musculoskeletal: Positive for arthralgias, back pain and gait problem.  Skin: Negative for pallor and rash.  Neurological: Negative for dizziness, tremors, weakness, numbness and headaches.  Psychiatric/Behavioral: Negative for confusion and sleep disturbance. The patient is nervous/anxious.     Objective:  BP 122/78 (BP Location: Left Arm)   Pulse 72   Temp 98.5 F (36.9 C) (Oral)   Wt 215 lb 9.6 oz (97.8 kg)   SpO2 98%   BMI 31.38 kg/m   BP Readings from Last 3 Encounters:  10/21/20 122/78  07/16/20 (!) 90/52  05/29/20 (!) 130/73    Wt Readings from Last 3 Encounters:  10/21/20 215 lb 9.6 oz (97.8 kg)  07/16/20 219 lb (99.3 kg)  05/29/20 (!) 214 lb 14.4 oz (97.5 kg)    Physical Exam Constitutional:      General: She is not in acute distress.    Appearance: She is well-developed.  HENT:     Head: Normocephalic.     Right Ear: External ear normal.     Left Ear: External ear normal.     Nose: Nose normal.     Mouth/Throat:     Mouth: Oropharynx is clear and moist.  Eyes:     General:        Right eye: No discharge.        Left eye: No discharge.     Conjunctiva/sclera: Conjunctivae normal.     Pupils: Pupils are equal, round, and reactive to light.  Neck:     Thyroid: No thyromegaly.     Vascular: No JVD.     Trachea: No tracheal deviation.  Cardiovascular:     Rate and Rhythm: Normal rate and regular rhythm.     Heart sounds: Normal heart sounds.  Pulmonary:     Effort: No respiratory distress.     Breath sounds: No stridor. No wheezing.  Abdominal:     General: Bowel sounds are normal. There is no distension.     Palpations: Abdomen is soft. There is no mass.     Tenderness: There is no abdominal tenderness. There is no guarding or rebound.  Musculoskeletal:        General:  Tenderness and deformity present. No edema.     Cervical back: Normal range of motion and neck supple.  Lymphadenopathy:     Cervical: No cervical adenopathy.  Skin:    Findings: No erythema or rash.  Neurological:     Mental Status: She is oriented to person, place, and time.  Cranial Nerves: No cranial nerve deficit.     Motor: No abnormal muscle tone.     Coordination: Coordination abnormal.     Gait: Gait abnormal.     Deep Tendon Reflexes: Reflexes normal.  Psychiatric:        Mood and Affect: Mood and affect normal.        Behavior: Behavior normal.        Thought Content: Thought content normal.        Judgment: Judgment normal.   B surg shoes R foot is wrapped  Lab Results  Component Value Date   WBC 7.5 01/14/2020   HGB 16.3 (H) 05/29/2020   HCT 48.0 (H) 05/29/2020   PLT 425 (H) 01/14/2020   GLUCOSE 253 (H) 05/29/2020   CHOL 223 (H) 07/19/2016   TRIG (H) 07/19/2016    527.0 Triglyceride is over 400; calculations on Lipids are invalid.   HDL 27.40 (L) 07/19/2016   LDLDIRECT 106.0 07/19/2016   LDLCALC 81 01/02/2013   ALT 14 01/11/2020   AST 18 01/11/2020   NA 137 05/29/2020   K 4.6 05/29/2020   CL 98 05/29/2020   CREATININE 0.90 05/29/2020   BUN 21 (H) 05/29/2020   CO2 27 01/15/2020   TSH 1.575 07/20/2019   INR 1.0 07/19/2019   HGBA1C 9.8 (H) 01/10/2020   MICROALBUR 0.9 07/19/2016    No results found.  Assessment & Plan:    Walker Kehr, MD

## 2020-10-27 ENCOUNTER — Ambulatory Visit: Payer: Medicare Other | Admitting: Podiatry

## 2020-10-27 ENCOUNTER — Ambulatory Visit (INDEPENDENT_AMBULATORY_CARE_PROVIDER_SITE_OTHER): Payer: Medicare Other | Admitting: Podiatry

## 2020-10-27 ENCOUNTER — Other Ambulatory Visit: Payer: Self-pay

## 2020-10-27 DIAGNOSIS — E08621 Diabetes mellitus due to underlying condition with foot ulcer: Secondary | ICD-10-CM | POA: Diagnosis not present

## 2020-10-27 DIAGNOSIS — L97413 Non-pressure chronic ulcer of right heel and midfoot with necrosis of muscle: Secondary | ICD-10-CM

## 2020-10-27 DIAGNOSIS — I82401 Acute embolism and thrombosis of unspecified deep veins of right lower extremity: Secondary | ICD-10-CM | POA: Diagnosis not present

## 2020-10-27 NOTE — Progress Notes (Signed)
  Subjective:  Patient ID: Katherine Herrera, female    DOB: [birth year],  MRN: [id number]  Chief Complaint  Patient presents with  . Leg Swelling    Right lower extremity swelling 1 day duration. There is a palpable mass noted which is tender when pressed.  . Wound Check    Pt states she is worried about redness.   54 y.o. female presents for wound care. Hx confirmed with patient.  Objective:  Physical Exam: Wound Location: right 5th met base Wound Measurement: 3x2.5 Wound Base: hypergranular Peri-wound: HPKotic Exudate: Scant/small amount Serosanguinous exudate wound without warmth, erythema, signs of acute infection  Right leg warmth, erythema  Assessment:   1. Acute deep vein thrombosis (DVT) of right lower extremity, unspecified vein (HCC)   2. Diabetic ulcer of right midfoot associated with diabetes mellitus due to underlying condition, with necrosis of muscle ([place])    Plan:  Patient was evaluated and treated and all questions answered.  Ulcer Right foot -Wound slightly smaller. Again cauterized given fully granular base.  Procedure: Chemical Cauterization of Granulation Tissue Rationale: Cauterize granular wound base to promote healing.  Wound Measurements: 2.5 cm x 2.5 cm x 0.3 cm  Instrumentation: Silver nitrate stick x3 Dressing: Dry, sterile, compression dressing. Disposition: Patient tolerated procedure well. Patient to return in 1 week for follow-up.  Cavus foot deformity, hammertoes -She has progression of hammertoe deformity and her midfoot appears more rigid. She may benefit from hammertoe correction, repair of varus deformity.  Right leg cellulitis, concern for DVT -Order [place]  Return in about 2 weeks (around 11/10/2020) for Wound Care.

## 2020-10-28 ENCOUNTER — Telehealth: Payer: Self-pay | Admitting: Podiatry

## 2020-10-28 ENCOUNTER — Ambulatory Visit (HOSPITAL_COMMUNITY)
Admission: RE | Admit: 2020-10-28 | Discharge: 2020-10-28 | Disposition: A | Discharge status: Home / Self Care | Payer: Medicare Other | Source: Ambulatory Visit | Attending: Podiatry | Admitting: Podiatry

## 2020-10-28 DIAGNOSIS — I82401 Acute embolism and thrombosis of unspecified deep veins of right lower extremity: Secondary | ICD-10-CM | POA: Insufficient documentation

## 2020-10-28 NOTE — Telephone Encounter (Signed)
The prelim report is negative for right lower extremity DPT. [phone]

## 2020-10-29 NOTE — Telephone Encounter (Signed)
Received - thanks

## 2020-11-03 ENCOUNTER — Ambulatory Visit: Payer: Medicare Other | Admitting: Podiatry

## 2020-11-10 ENCOUNTER — Other Ambulatory Visit: Payer: Self-pay | Admitting: Internal Medicine

## 2020-11-10 ENCOUNTER — Ambulatory Visit (INDEPENDENT_AMBULATORY_CARE_PROVIDER_SITE_OTHER): Payer: Medicare Other | Admitting: Podiatry

## 2020-11-10 ENCOUNTER — Other Ambulatory Visit: Payer: Self-pay

## 2020-11-10 DIAGNOSIS — R6889 Other general symptoms and signs: Secondary | ICD-10-CM | POA: Diagnosis not present

## 2020-11-10 DIAGNOSIS — Q6671 Congenital pes cavus, right foot: Secondary | ICD-10-CM | POA: Diagnosis not present

## 2020-11-10 DIAGNOSIS — L929 Granulomatous disorder of the skin and subcutaneous tissue, unspecified: Secondary | ICD-10-CM

## 2020-11-10 DIAGNOSIS — L97511 Non-pressure chronic ulcer of other part of right foot limited to breakdown of skin: Secondary | ICD-10-CM

## 2020-11-10 DIAGNOSIS — M21961 Unspecified acquired deformity of right lower leg: Secondary | ICD-10-CM

## 2020-11-10 NOTE — Progress Notes (Signed)
  Subjective:  Patient ID: Katherine Herrera, female    DOB: 1966/02/21,  MRN: [id number]  Chief Complaint  Patient presents with  . Wound Check    Right foot wound check. Pt states continued drainage, denies fever/chills/nausea/vomiting.   55 y.o. female presents for wound care. Hx confirmed with patient.  Objective:  Physical Exam: Wound Location: right 5th met base Wound Measurement: 2.5x2.5  Wound Base: hypergranular Peri-wound: HPKotic Exudate: Scant/small amount Serosanguinous exudate wound without warmth, erythema, signs of acute infection  Right leg warmth, erythema  Assessment:   1. Granulation tissue   2. Ulcer of right foot, limited to breakdown of skin (HCC)   3. Cavus deformity of right foot   4. Deformity of metatarsal bone of right foot    Plan:  Patient was evaluated and treated and all questions answered.  Ulcer Right foot -Wound similar in size to last visit.  Cauterized with silver nitrate  Procedure: Chemical Cauterization of Granulation Tissue Rationale: Cauterize granular wound base to promote healing.  Wound Measurements: 2.5 cm x 2.5 cm x 0.1 cm  Instrumentation: Silver nitrate stick x3 Dressing: Dry, sterile, compression dressing. Disposition: Patient tolerated procedure well. Patient to return in 1 week for follow-up.   Cavus foot deformity, hammertoes -We did discuss possibility of correcting the deformity that is leading to ulceration.  Would consider staged procedures possible Dwyer osteotomy of calcaneus with or without further fifth metatarsal resection.   Return in about 3 weeks (around 12/01/2020) for Wound Care.

## 2020-11-23 ENCOUNTER — Telehealth: Payer: Self-pay | Admitting: Internal Medicine

## 2020-11-23 NOTE — Telephone Encounter (Signed)
Last RF per controlled substance database: Clonazepam 10/21/20; Oxycodone 10/21/20 Last OV: 10/21/20 Next OV: 01/26/21

## 2020-11-23 NOTE — Telephone Encounter (Signed)
[facility] called and wanted to know if Dr. Alain Marion wanted the patient to continue oxyCODONE (ROXICODONE) 15 MG immediate release tablet and clonazePAM (KLONOPIN) 0.5 MG tablet. She said the prescription for oxyCODONE (ROXICODONE) 15 MG immediate release tablet was sent back in July. Please give them a call back at [phone]. Ref. # [id number].

## 2020-11-24 MED ORDER — OXYCODONE HCL 15 MG PO TABS: 15.0000 mg | ORAL_TABLET | Freq: Four times a day (QID) | ORAL | 0 refills | Status: DC | PRN

## 2020-11-24 MED ORDER — CLONAZEPAM 0.5 MG PO TABS: 0.5000 mg | ORAL_TABLET | Freq: Two times a day (BID) | ORAL | 1 refills | Status: DC | PRN

## 2020-11-24 NOTE — Telephone Encounter (Signed)
Ok Thx 

## 2020-11-24 NOTE — Addendum Note (Signed)
Addended by: Cassandria Anger on: 11/24/2020 07:40 AM   Modules accepted: Orders

## 2020-11-24 NOTE — Telephone Encounter (Signed)
Notified [facility] pharmacist w/MD response.Marland KitchenJohny Chess

## 2020-12-01 DIAGNOSIS — I10 Essential (primary) hypertension: Secondary | ICD-10-CM | POA: Diagnosis not present

## 2020-12-01 DIAGNOSIS — E1165 Type 2 diabetes mellitus with hyperglycemia: Secondary | ICD-10-CM | POA: Diagnosis not present

## 2020-12-01 DIAGNOSIS — E1140 Type 2 diabetes mellitus with diabetic neuropathy, unspecified: Secondary | ICD-10-CM | POA: Diagnosis not present

## 2020-12-01 DIAGNOSIS — E039 Hypothyroidism, unspecified: Secondary | ICD-10-CM | POA: Diagnosis not present

## 2020-12-01 DIAGNOSIS — N189 Chronic kidney disease, unspecified: Secondary | ICD-10-CM | POA: Diagnosis not present

## 2020-12-01 DIAGNOSIS — E7800 Pure hypercholesterolemia, unspecified: Secondary | ICD-10-CM | POA: Diagnosis not present

## 2020-12-04 ENCOUNTER — Ambulatory Visit: Payer: Medicare Other | Admitting: Podiatry

## 2020-12-08 ENCOUNTER — Ambulatory Visit: Payer: Medicare Other | Admitting: Podiatry

## 2020-12-15 ENCOUNTER — Ambulatory Visit (INDEPENDENT_AMBULATORY_CARE_PROVIDER_SITE_OTHER): Payer: Medicare Other | Admitting: Podiatry

## 2020-12-15 ENCOUNTER — Other Ambulatory Visit: Payer: Self-pay

## 2020-12-15 DIAGNOSIS — E11621 Type 2 diabetes mellitus with foot ulcer: Secondary | ICD-10-CM | POA: Diagnosis not present

## 2020-12-15 DIAGNOSIS — L97415 Non-pressure chronic ulcer of right heel and midfoot with muscle involvement without evidence of necrosis: Secondary | ICD-10-CM | POA: Diagnosis not present

## 2020-12-16 ENCOUNTER — Ambulatory Visit: Payer: Medicare Other

## 2020-12-22 ENCOUNTER — Other Ambulatory Visit: Payer: Self-pay

## 2020-12-22 ENCOUNTER — Ambulatory Visit (INDEPENDENT_AMBULATORY_CARE_PROVIDER_SITE_OTHER): Payer: Medicare Other

## 2020-12-22 VITALS — BP 110/70 | HR 83 | Temp 98.3°F | Ht 70.0 in | Wt 218.6 lb

## 2020-12-22 DIAGNOSIS — Z0000 Encounter for general adult medical examination without abnormal findings: Secondary | ICD-10-CM

## 2020-12-22 NOTE — Patient Instructions (Signed)
Katherine Herrera , Thank you for taking time to come for your Medicare Wellness Visit. I appreciate your ongoing commitment to your health goals. Please review the following plan we discussed and let me know if I can assist you in the future.   Screening recommendations/referrals: Colonoscopy: 11/18/2010; due every 10 years Mammogram: 01/15/2015 Bone Density: never done Recommended yearly ophthalmology/optometry visit for glaucoma screening and checkup Recommended yearly dental visit for hygiene and checkup  Vaccinations: Influenza vaccine: 10/21/2020 Pneumococcal vaccine: 02/17/2017 Tdap vaccine: overdue Shingles vaccine: never done  Covid-19: up to date  Advanced directives: Advance directive discussed with you today. Even though you declined this today please call our office should you change your mind and we can give you the proper paperwork for you to fill out.  Conditions/risks identified: Yes; Reviewed health maintenance screenings with patient today and relevant education, vaccines, and/or referrals were provided. Please continue to do your personal lifestyle choices by: daily care of teeth and gums, regular physical activity (goal should be 5 days a week for 30 minutes), eat a healthy diet, avoid tobacco and drug use, limiting any alcohol intake, taking a low-dose aspirin (if not allergic or have been advised by your provider otherwise) and taking vitamins and minerals as recommended by your provider. Continue doing brain stimulating activities (puzzles, reading, adult coloring books, staying active) to keep memory sharp. Continue to eat heart healthy diet (full of fruits, vegetables, whole grains, lean protein, water--limit salt, fat, and sugar intake) and increase physical activity as tolerated.  Next appointment: Please schedule your next Medicare Wellness Visit with your Nurse Health Advisor in 1 year by calling [phone].   Preventive Care 40-64 Years, Female Preventive care refers  to lifestyle choices and visits with your health care provider that can promote health and wellness. What does preventive care include?  A yearly physical exam. This is also called an annual well check.  Dental exams once or twice a year.  Routine eye exams. Ask your health care provider how often you should have your eyes checked.  Personal lifestyle choices, including:  Daily care of your teeth and gums.  Regular physical activity.  Eating a healthy diet.  Avoiding tobacco and drug use.  Limiting alcohol use.  Practicing safe sex.  Taking low-dose aspirin daily starting at age 65.  Taking vitamin and mineral supplements as recommended by your health care provider. What happens during an annual well check? The services and screenings done by your health care provider during your annual well check will depend on your age, overall health, lifestyle risk factors, and family history of disease. Counseling  Your health care provider may ask you questions about your:  Alcohol use.  Tobacco use.  Drug use.  Emotional well-being.  Home and relationship well-being.  Sexual activity.  Eating habits.  Work and work Statistician.  Method of birth control.  Menstrual cycle.  Pregnancy history. Screening  You may have the following tests or measurements:  Height, weight, and BMI.  Blood pressure.  Lipid and cholesterol levels. These may be checked every 5 years, or more frequently if you are over 58 years old.  Skin check.  Lung cancer screening. You may have this screening every year starting at age 49 if you have a 30-pack-year history of smoking and currently smoke or have quit within the past 15 years.  Fecal occult blood test (FOBT) of the stool. You may have this test every year starting at age 4.  Flexible sigmoidoscopy or colonoscopy. You  may have a sigmoidoscopy every 5 years or a colonoscopy every 10 years starting at age 93.  Hepatitis C blood  test.  Hepatitis B blood test.  Sexually transmitted disease (STD) testing.  Diabetes screening. This is done by checking your blood sugar (glucose) after you have not eaten for a while (fasting). You may have this done every 1-3 years.  Mammogram. This may be done every 1-2 years. Talk to your health care provider about when you should start having regular mammograms. This may depend on whether you have a family history of breast cancer.  BRCA-related cancer screening. This may be done if you have a family history of breast, ovarian, tubal, or peritoneal cancers.  Pelvic exam and Pap test. This may be done every 3 years starting at age 9. Starting at age 58, this may be done every 5 years if you have a Pap test in combination with an HPV test.  Bone density scan. This is done to screen for osteoporosis. You may have this scan if you are at high risk for osteoporosis. Discuss your test results, treatment options, and if necessary, the need for more tests with your health care provider. Vaccines  Your health care provider may recommend certain vaccines, such as:  Influenza vaccine. This is recommended every year.  Tetanus, diphtheria, and acellular pertussis (Tdap, Td) vaccine. You may need a Td booster every 10 years.  Zoster vaccine. You may need this after age 66.  Pneumococcal 13-valent conjugate (PCV13) vaccine. You may need this if you have certain conditions and were not previously vaccinated.  Pneumococcal polysaccharide (PPSV23) vaccine. You may need one or two doses if you smoke cigarettes or if you have certain conditions. Talk to your health care provider about which screenings and vaccines you need and how often you need them. This information is not intended to replace advice given to you by your health care provider. Make sure you discuss any questions you have with your health care provider. Document Released: 11/13/2015 Document Revised: 07/06/2016 Document Reviewed:  08/18/2015 Elsevier Interactive Patient Education  2017 [employer] Prevention in the Home Falls can cause injuries. They can happen to people of all ages. There are many things you can do to make your home safe and to help prevent falls. What can I do on the outside of my home?  Regularly fix the edges of walkways and driveways and fix any cracks.  Remove anything that might make you trip as you walk through a door, such as a raised step or threshold.  Trim any bushes or trees on the path to your home.  Use bright outdoor lighting.  Clear any walking paths of anything that might make someone trip, such as rocks or tools.  Regularly check to see if handrails are loose or broken. Make sure that both sides of any steps have handrails.  Any raised decks and porches should have guardrails on the edges.  Have any leaves, snow, or ice cleared regularly.  Use sand or salt on walking paths during winter.  Clean up any spills in your garage right away. This includes oil or grease spills. What can I do in the bathroom?  Use night lights.  Install grab bars by the toilet and in the tub and shower. Do not use towel bars as grab bars.  Use non-skid mats or decals in the tub or shower.  If you need to sit down in the shower, use a plastic, non-slip stool.  Keep the floor dry. Clean up any water that spills on the floor as soon as it happens.  Remove soap buildup in the tub or shower regularly.  Attach bath mats securely with double-sided non-slip rug tape.  Do not have throw rugs and other things on the floor that can make you trip. What can I do in the bedroom?  Use night lights.  Make sure that you have a light by your bed that is easy to reach.  Do not use any sheets or blankets that are too big for your bed. They should not hang down onto the floor.  Have a firm chair that has side arms. You can use this for support while you get dressed.  Do not have throw  rugs and other things on the floor that can make you trip. What can I do in the kitchen?  Clean up any spills right away.  Avoid walking on wet floors.  Keep items that you use a lot in easy-to-reach places.  If you need to reach something above you, use a strong step stool that has a grab bar.  Keep electrical cords out of the way.  Do not use floor polish or wax that makes floors slippery. If you must use wax, use non-skid floor wax.  Do not have throw rugs and other things on the floor that can make you trip. What can I do with my stairs?  Do not leave any items on the stairs.  Make sure that there are handrails on both sides of the stairs and use them. Fix handrails that are broken or loose. Make sure that handrails are as long as the stairways.  Check any carpeting to make sure that it is firmly attached to the stairs. Fix any carpet that is loose or worn.  Avoid having throw rugs at the top or bottom of the stairs. If you do have throw rugs, attach them to the floor with carpet tape.  Make sure that you have a light switch at the top of the stairs and the bottom of the stairs. If you do not have them, ask someone to add them for you. What else can I do to help prevent falls?  Wear shoes that:  Do not have high heels.  Have rubber bottoms.  Are comfortable and fit you well.  Are closed at the toe. Do not wear sandals.  If you use a stepladder:  Make sure that it is fully opened. Do not climb a closed stepladder.  Make sure that both sides of the stepladder are locked into place.  Ask someone to hold it for you, if possible.  Clearly mark and make sure that you can see:  Any grab bars or handrails.  First and last steps.  Where the edge of each step is.  Use tools that help you move around (mobility aids) if they are needed. These include:  Canes.  Walkers.  Scooters.  Crutches.  Turn on the lights when you go into a dark area. Replace any light  bulbs as soon as they burn out.  Set up your furniture so you have a clear path. Avoid moving your furniture around.  If any of your floors are uneven, fix them.  If there are any pets around you, be aware of where they are.  Review your medicines with your doctor. Some medicines can make you feel dizzy. This can increase your chance of falling. Ask your doctor what other things that you can  do to help prevent falls. This information is not intended to replace advice given to you by your health care provider. Make sure you discuss any questions you have with your health care provider. Document Released: 08/13/2009 Document Revised: 03/24/2016 Document Reviewed: 11/21/2014 Elsevier Interactive Patient Education  2017 [employer].

## 2020-12-22 NOTE — Progress Notes (Addendum)
Subjective:   Katherine Herrera is a 55 y.o. female who presents for Medicare Annual (Subsequent) preventive examination.  Review of Systems    No ROS. Medicare Wellness Visit. Additional risk factors are reflected in social history. Cardiac Risk Factors include: diabetes mellitus;dyslipidemia;hypertension;family history of premature cardiovascular disease;obesity (BMI >30kg/m2);smoking/ tobacco exposure     Objective:    Today's Vitals   12/22/20 0901  BP: 110/70  Pulse: 83  Temp: 98.3 F (36.8 C)  SpO2: 93%  Weight: 218 lb 9.6 oz (99.2 kg)  Height: '5\' 10"'  (1.778 m)  PainSc: 0-No pain   Body mass index is 31.37 kg/m.  Advanced Directives 12/22/2020 05/29/2020 01/10/2020 07/22/2019 07/22/2019 07/19/2019 07/05/2019  Does Patient Have a Medical Advance Directive? No No No No No No No  Would patient like information on creating a medical advance directive? No - Patient declined Yes (MAU/Ambulatory/Procedural Areas - Information given) No - Patient declined No - Patient declined No - Patient declined No - Patient declined No - Patient declined  Pre-existing out of facility DNR order (yellow form or pink MOST form) - - - - - - -    Current Medications (verified) Outpatient Encounter Medications as of 12/22/2020  Medication Sig   aspirin (BAYER ASPIRIN) 325 MG tablet Take 1 tablet (325 mg total) by mouth daily.   B-D INS SYR ULTRAFINE 1CC/31G 31G X 5/16" 1 ML MISC USE AS DIRECTED   baclofen (LIORESAL) 10 MG tablet TAKE 2 TABLETS BY MOUTH  TWICE DAILY   bisacodyl (DULCOLAX) 5 MG EC tablet Take 10 mg by mouth at bedtime.   Blood Glucose Monitoring Suppl (ONETOUCH VERIO) w/Device KIT    clindamycin (CLEOCIN) 300 MG capsule Take 1 capsule (300 mg total) by mouth 2 (two) times daily.   clonazePAM (KLONOPIN) 0.5 MG tablet Take 1 tablet (0.5 mg total) by mouth 2 (two) times daily as needed for anxiety.   doxycycline (VIBRA-TABS) 100 MG tablet Take 1 tablet (100 mg total) by mouth 2 (two) times  daily.   ezetimibe (ZETIA) 10 MG tablet TAKE 1 TABLET BY MOUTH  DAILY   fluticasone (FLONASE) 50 MCG/ACT nasal spray Place 2 sprays into both nostrils daily. (Patient taking differently: Place 2 sprays into both nostrils daily as needed.)   gabapentin (NEURONTIN) 800 MG tablet TAKE 1 TABLET BY MOUTH 4  TIMES DAILY   glucose blood (ONETOUCH VERIO) test strip Use to check blood sugar 2 times per day.   insulin NPH-regular Human (70-30) 100 UNIT/ML injection Inject 10 Units into the skin 2 (two) times daily with a meal. (Patient taking differently: Inject into the skin as directed. 05-26-2020  Per pt 50 units an am and evening and at lunch time if cbg >200 it's 10 units)   Insulin Syringe-Needle U-100 (SURE COMFORT INSULIN SYRINGE) 31G X 5/16" 0.5 ML MISC USE AS DIRECTED WITH INSULIN 4 TIMES A DAY   lamoTRIgine (LAMICTAL) 25 MG tablet TAKE 1 TABLET BY MOUTH  TWICE DAILY   Lancet Devices (ACCU-CHEK SOFTCLIX) lancets Test two times daily   Lancets (ONETOUCH DELICA PLUS [id number]) MISC    levothyroxine (SYNTHROID) 75 MCG tablet TAKE 1 TABLET BY MOUTH  DAILY   lisinopril (ZESTRIL) 10 MG tablet TAKE 1 TABLET BY MOUTH  DAILY (Patient taking differently: Take 10 mg by mouth at bedtime.)   loratadine (CLARITIN) 10 MG tablet Take 1 tablet (10 mg total) by mouth daily as needed for allergies.   ondansetron (ZOFRAN) 4 MG tablet 1 tablet  oxyCODONE (ROXICODONE) 15 MG immediate release tablet Take 1 tablet (15 mg total) by mouth every 6 (six) hours as needed.   rOPINIRole (REQUIP) 0.5 MG tablet TAKE 1 TABLET BY MOUTH AT  BEDTIME   senna-docusate (SENOKOT-S) 8.6-50 MG tablet Take 1 tablet by mouth 2 (two) times daily. (Patient taking differently: Take 1 tablet by mouth at bedtime.)   silver sulfADIAZINE (SILVADENE) 1 % cream Apply pea-sized amount to wound daily.   traMADol (ULTRAM) 50 MG tablet 1 tablet as needed   vitamin B-12 (CYANOCOBALAMIN) 1000 MCG tablet Take 1,000 mcg by mouth daily.   zolpidem (AMBIEN)  10 MG tablet 1 tablet at bedtime as needed   No facility-administered encounter medications on file as of 12/22/2020.    Allergies (verified) Cephalexin, Demerol  [meperidine hcl], Lovastatin, Metformin and related, Sulfamethoxazole-trimethoprim, Crestor [rosuvastatin calcium], Niacin, Other, Hydromorphone hcl, and Paroxetine   History: Past Medical History:  Diagnosis Date   Anxiety    Arthritis    hands   B12 deficiency    Chronic constipation    Chronic pain syndrome    neck, lower back, and foot   COPD (chronic obstructive pulmonary disease) ([place])    Diabetic ulcer of right foot ([place])    Gout    possible   History of seizure    05-26-2020  per pt as child and last one in late teen's   History of sepsis    due to lower extremity cellulits 2019 left , right 03/ 2021   History of syncope    Hyperlipidemia    Hypothyroidism    Impaired range of motion of cervical spine    per pt hx cervical fusion C4 -- 7,  has to be careful about turning her head to the right can cause her to pass out   Insulin dependent type 2 diabetes mellitus [facility])    endocrinologist--- dr Chalmers Cater    (05-26-2020 per pt check's blood sugar twice dialy and at times a third time,  fasting am blood sugar's---- 200 -- 250)   LBP (low back pain)    DR Patrice Paradise   Migraine    Peripheral neuropathy    RBBB (right bundle branch block)    Tenosynovitis of foot and ankle 09/03/2013   TTS (tarsal tunnel syndrome) 2010   Left Foot [facility] in [place]   Vitamin D deficiency    Wears glasses    Past Surgical History:  Procedure Laterality Date   ANTERIOR CERVICAL DECOMP/DISCECTOMY FUSION  02-12-2004;  07-06-2006   C4--5 in 2005;  C5 --7  in  9741   [place] Right 07/05/2019   Procedure: Application Of Wound Vac;  Surgeon: Evelina Bucy, DPM;  Location: [place];  Service: Podiatry;  Laterality: Right;   BONE BIOPSY Right 05/29/2020   Procedure: BONE BIOPSY;  Surgeon: Evelina Bucy, DPM;  Location: [place];  Service: Podiatry;  Laterality: Right;   CARDIAC CATHETERIZATION  05-27-2002   dr Einar Gip   for positive cardiolite;  normal coronaries and LVF, ef 70%   CARPAL TUNNEL RELEASE Bilateral left 12-25-2008;  right 09-22-2009  both '@[facility]'    HARDWARE REMOVAL  01-07-2016  '@[facility]'    right foot   INCISION AND DRAINAGE OF WOUND Right 07/20/2019   Procedure: IRRIGATION AND DEBRIDEMENT foot;  Surgeon: Evelina Bucy, DPM;  [facility];  Service: Podiatry;  Laterality: Right;   INCISION AND DRAINAGE OF WOUND Right 07/22/2019   Procedure: IRRIGATION AND DEBRIDEMENT RIGHT FOOT; APPLICATION  OF WOUND VAC;  Surgeon: Evelina Bucy, DPM;  [facility];  Service: Podiatry;  Laterality: Right;   INCISION AND DRAINAGE OF WOUND Right 07/24/2019   Procedure: IRRIGATION AND DEBRIDEMENT WOUND, APPLICATION OF WOUND VAC;  Surgeon: Evelina Bucy, DPM;  [facility];  Service: Podiatry;  Laterality: Right;   INCISION AND DRAINAGE OF WOUND Right 05/29/2020   Procedure: RIGHT FOOT WOUND DEBRIDEMENT AND IRRIGATION;  Surgeon: Evelina Bucy, DPM;  Location: [place];  Service: Podiatry;  Laterality: Right;   LUMBAR DISC SURGERY  07-07-2005   '[facility]    L5 - S1   METATARSAL HEAD EXCISION Bilateral 01/02/2019   Procedure: Left foot 5th metatarsal resection, wound excision, adjacent tissue transfer. Right fifth metatarsal head resection, fitfth metatarsal base resection, intermediate wound repair.;  Surgeon: Evelina Bucy, DPM;  Location: [place];  Service: Podiatry;  Laterality: Bilateral;   METATARSAL OSTEOTOMY Right 07/05/2019   Procedure: RIGHT FOOT 5TH METATARSAL RESECTION; PERONEAL TENDONESIS, EXCISION OF ULCER, LOCAL TISSUE TRANSFER ;  Surgeon: Evelina Bucy, DPM;  Location: [place];  Service: Podiatry;  Laterality: Right;   ORIF METATARSAL FRACTURE  09-22-2015  '@[facility]'    right 5th   TOTAL ABDOMINAL HYSTERECTOMY W/ BILATERAL SALPINGOOPHORECTOMY  2000 approx.   Family  History  Problem Relation Age of Onset   Diabetes Mother    Hypertension Mother    Cancer Father 77       ? bone cancer   [name] Brother    Social History   Socioeconomic History   Marital status: Married    Spouse name: Not on file   Number of children: Not on file   Years of education: Not on file   Highest education level: Not on file  Occupational History   Not on file  Tobacco Use   Smoking status: Current Every Day Smoker    Packs/day: 1.00    Years: 8.00    Pack years: 8.00    Types: Cigarettes   Smokeless tobacco: Never Used  Vaping Use   Vaping Use: Never used  Substance and Sexual Activity   Alcohol use: No    Alcohol/week: 0.0 standard drinks   Drug use: No   Sexual activity: Never  Other Topics Concern   Not on file  Social History Narrative   GYN - Dr Radene Knee      FAMILY HISTORY   Lung cancer   Mother w/alcoholism      Married 3d GS due Nov   Diet Coke: 10 cans a day      Regular exercise - NO      Former smoker 2009 restared 2010 stopped 12/2009   Social Determinants of Radio broadcast assistant Strain: Low Risk    Difficulty of Paying Living Expenses: Not hard at all  Food Insecurity: No Food Insecurity   Worried About Charity fundraiser in the Last Year: Never true   Arboriculturist in the Last Year: Never true  Transportation Needs: No Transportation Needs   Lack of Transportation (Medical): No   Lack of Transportation (Non-Medical): No  Physical Activity: Inactive   Days of Exercise per Week: 0 days   Minutes of Exercise per Session: 0 min  Stress: No Stress Concern Present   Feeling of Stress : Not at all  Social Connections: Moderately Isolated   Frequency of Communication with Friends and Family: More than three times a week   Frequency of Social Gatherings  with Friends and Family: Once a week   Attends Religious Services: Never   Marine scientist or Organizations: No   Attends Music therapist:  Never   Marital Status: Married    Tobacco Counseling Ready to quit: Not Answered Counseling given: Not Answered   Clinical Intake:  Pre-visit preparation completed: Yes  Pain Score: 0-No pain     BMI - recorded: 31.37 Nutritional Status: BMI > 30  Obese Nutritional Risks: None Diabetes: Yes CBG done?: No Did pt. bring in CBG monitor from home?: No  How often do you need to have someone help you when you read instructions, pamphlets, or other written materials from your doctor or pharmacy?: 1 - Never What is the last grade level you completed in school?: High School Graduate  Diabetic? yes  Interpreter Needed?: No  Information entered by :: Lisette Abu, LPN   Activities of Daily Living In your present state of health, do you have any difficulty performing the following activities: 12/22/2020 05/29/2020  Hearing? N N  Vision? N N  Difficulty concentrating or making decisions? N N  Walking or climbing stairs? N Y  Comment - due to (R) foot  Dressing or bathing? N N  Doing errands, shopping? N -  Preparing Food and eating ? N -  Using the Toilet? N -  In the past six months, have you accidently leaked urine? Y -  Do you have problems with loss of bowel control? N -  Managing your Medications? N -  Managing your Finances? N -  Housekeeping or managing your Housekeeping? N -  Some recent data might be hidden    Patient Care Team: Plotnikov, [name], MD as PCP - General Arvella Nigh, MD as Consulting Physician (Obstetrics and Gynecology) Evelina Bucy, DPM as Consulting Physician (Podiatry) Jacelyn Pi, MD as Referring Physician (Endocrinology)  Indicate any recent Medical Services you may have received from other than [facility] providers in the past year (date may be approximate).     Assessment:   This is a routine wellness examination for Katherine Herrera.  Hearing/Vision screen No exam data present  Dietary issues and exercise activities  discussed: Current Exercise Habits: The patient does not participate in regular exercise at present  Goals      Stay as healthy as possible     Stay as active as I can, eat healthy, enjoy family.       Depression Screen PHQ 2/9 Scores 12/22/2020 04/21/2020 05/19/2017 02/17/2017  PHQ - 2 Score 0 0 2 0  PHQ- 9 Score - - 6 -    Fall Risk Fall Risk  12/22/2020 05/19/2017 02/17/2017  Falls in the past year? 0 No No  Number falls in past yr: 0 - -  Injury with Fall? 0 - -  Risk for fall due to : No Fall Risks - -  Follow up Falls evaluation completed - -    FALL RISK PREVENTION PERTAINING TO THE HOME:  Any stairs in or around the home? Yes  If so, are there any without handrails? No  Home free of loose throw rugs in walkways, pet beds, electrical cords, etc? Yes  Adequate lighting in your home to reduce risk of falls? Yes   ASSISTIVE DEVICES UTILIZED TO PREVENT FALLS:  Life alert? No  Use of a cane, walker or w/c? No  Grab bars in the bathroom? Yes  Shower chair or bench in shower? Yes  Elevated toilet seat or a handicapped toilet?  No   TIMED UP AND GO:  Was the test performed? No .  Length of time to ambulate 10 feet: 0 sec.   Gait steady and fast without use of assistive device  Cognitive Function: Normal cognitive status assessed by direct observation by this Nurse Health Advisor. No abnormalities found.  .        Immunizations Immunization History  Administered Date(s) Administered   DTaP 06/15/2011   Influenza, Quadrivalent, Recombinant, Inj, Pf 07/31/2018   Influenza,inj,Quad PF,6+ Mos 08/12/2015, 07/19/2016, 07/01/2019, 10/21/2020   PFIZER(Purple Top)SARS-COV-2 Vaccination 07/15/2020, 09/28/2020   Pneumococcal Conjugate-13 02/17/2017   Td 10/31/2008    TDAP status: Due, Education has been provided regarding the importance of this vaccine. Advised may receive this vaccine at local pharmacy or Health Dept. Aware to provide a copy of the vaccination record if  obtained from local pharmacy or Health Dept. Verbalized acceptance and understanding.  Flu Vaccine status: Up to date  Pneumococcal vaccine status: Due, Education has been provided regarding the importance of this vaccine. Advised may receive this vaccine at local pharmacy or Health Dept. Aware to provide a copy of the vaccination record if obtained from local pharmacy or Health Dept. Verbalized acceptance and understanding.  Covid-19 vaccine status: Completed vaccines  Qualifies for Shingles Vaccine? Yes   Zostavax completed No   Shingrix Completed?: No.    Education has been provided regarding the importance of this vaccine. Patient has been advised to call insurance company to determine out of pocket expense if they have not yet received this vaccine. Advised may also receive vaccine at local pharmacy or Health Dept. Verbalized acceptance and understanding.  Screening Tests Health Maintenance  Topic Date Due   PNEUMOCOCCAL POLYSACCHARIDE VACCINE AGE 31-64 HIGH RISK  Never done   COLONOSCOPY (Pts 45-63yr Insurance coverage will need to be confirmed)  Never done   MAMMOGRAM  01/14/2017   OPHTHALMOLOGY EXAM  11/01/2017   TETANUS/TDAP  10/31/2018   FOOT EXAM  11/13/2018   COVID-19 Vaccine (3 - Booster for Pfizer series) 03/28/2021   HEMOGLOBIN A1C  04/21/2021   INFLUENZA VACCINE  Completed   Hepatitis C Screening  Completed   HIV Screening  Completed    Health Maintenance  Health Maintenance Due  Topic Date Due   PNEUMOCOCCAL POLYSACCHARIDE VACCINE AGE 31-64 HIGH RISK  Never done   COLONOSCOPY (Pts 45-457yrInsurance coverage will need to be confirmed)  Never done   MAMMOGRAM  01/14/2017   OPHTHALMOLOGY EXAM  11/01/2017   TETANUS/TDAP  10/31/2018   FOOT EXAM  11/13/2018    Colorectal cancer screening: Type of screening: Colonoscopy. Completed 11/18/2010. Repeat every 10 years  Mammogram status: Completed 01/15/2015. Repeat every year  Bone density status: never done  Lung  Cancer Screening: (Low Dose CT Chest recommended if Age 55-80ears, 30 pack-year currently smoking OR have quit w/in 15years.) does qualify.   Lung Cancer Screening Referral: no  Additional Screening:  Hepatitis C Screening: does qualify; Completed yes  Vision Screening: Recommended annual ophthalmology exams for early detection of glaucoma and other disorders of the eye. Is the patient up to date with their annual eye exam?  Yes  Who is the provider or what is the name of the office in which the patient attends annual eye exams? [facility] in [place] pt is not established with a provider, would they like to be referred to a provider to establish care? No .   Dental Screening: Recommended annual dental exams for proper oral hygiene  Community Resource Referral / Chronic Care Management: CRR required this visit?  No   CCM required this visit?  No      Plan:     I have personally reviewed and noted the following in the patient's chart:   Medical and social history Use of alcohol, tobacco or illicit drugs  Current medications and supplements Functional ability and status Nutritional status Physical activity Advanced directives List of other physicians Hospitalizations, surgeries, and ER visits in previous 12 months Vitals Screenings to include cognitive, depression, and falls Referrals and appointments  In addition, I have reviewed and discussed with patient certain preventive protocols, quality metrics, and best practice recommendations. A written personalized care plan for preventive services as well as general preventive health recommendations were provided to patient.     Sheral Flow, LPN   0/[id number]/8891   Nurse Notes:  Medications reviewed with patient; opioid use noted.   Medical screening examination/treatment/procedure(s) were performed by non-physician practitioner and as supervising physician I was immediately available for consultation/collaboration.   I agree with above. Lew Dawes, MD

## 2020-12-23 ENCOUNTER — Other Ambulatory Visit: Payer: Self-pay | Admitting: Internal Medicine

## 2020-12-28 NOTE — Progress Notes (Signed)
  Subjective:  Patient ID: Katherine Herrera, female    DOB: Mar 11, 1966,  MRN: [id number]  Chief Complaint  Patient presents with  . Wound Check     F/U Rt wound check -pt denies N/V/F/Ch -pt states," it's the same." - w/ white draiange and 8/10 sharp pains towards the evening -with odor when it drains - pt denies redness/swelling or heat Tx: dry dressing or silvadene dressing   . Diabetes    FBS: 147 a1C: 9.1   55 y.o. female presents for wound care. Hx confirmed with patient.  Objective:  Physical Exam: Wound Location: right 5th met base Wound Measurement: 2.5x2 Wound Base: hypergranular Peri-wound: HPKotic Exudate: Scant/small amount Serosanguinous exudate wound without warmth, erythema, signs of acute infection  Assessment:   1. Diabetic ulcer of right midfoot associated with type 2 diabetes mellitus, with muscle involvement without evidence of necrosis [facility])    Plan:  Patient was evaluated and treated and all questions answered.  Ulcer Right foot -Wound debridement again cauterized with silver nitrate. -Wound is looking quite granular we will look into skin graft substitute.  Dressed with dry sterile dressing today.  Patient to leave intact for 2 days and then apply Silvadene daily thereafter  Procedure: Chemical Cauterization of Granulation Tissue Rationale: Cauterize granular wound base to promote healing.  Wound Measurements: 2.5 cm x 2 cm x 0.2 cm  Instrumentation: Silver nitrate stick x3 Dressing: Dry, sterile, compression dressing. Disposition: Patient tolerated procedure well. Patient to return in 1 week for follow-up.  Cavus foot deformity, hammertoes -We did discuss possibility of correcting the deformity that is leading to ulceration.  Would consider staged procedures possible Dwyer osteotomy of calcaneus with or without further fifth metatarsal resection.   Return in about 2 weeks (around 12/29/2020) for Wound Care.

## 2021-01-01 ENCOUNTER — Other Ambulatory Visit: Payer: Self-pay

## 2021-01-01 ENCOUNTER — Ambulatory Visit (INDEPENDENT_AMBULATORY_CARE_PROVIDER_SITE_OTHER): Payer: Medicare Other | Admitting: Podiatry

## 2021-01-01 DIAGNOSIS — L97415 Non-pressure chronic ulcer of right heel and midfoot with muscle involvement without evidence of necrosis: Secondary | ICD-10-CM

## 2021-01-01 DIAGNOSIS — E11621 Type 2 diabetes mellitus with foot ulcer: Secondary | ICD-10-CM

## 2021-01-01 NOTE — Progress Notes (Signed)
  Subjective:  Patient ID: Katherine Herrera, female    DOB: Mar 23, 1966,  MRN: [id number]  No chief complaint on file.  54 y.o. female presents for wound care. Hx confirmed with patient.  Objective:  Physical Exam: Wound Location: right 5th met base Wound Measurement: 2.5x2 Wound Base: hypergranular Peri-wound: HPKotic Exudate: Scant/small amount Serosanguinous exudate wound without warmth, erythema, signs of acute infection  Assessment:   1. Diabetic ulcer of right midfoot associated with type 2 diabetes mellitus, with muscle involvement without evidence of necrosis [facility])    Plan:  Patient was evaluated and treated and all questions answered.  Ulcer Right foot -Wound not in need of debridement today -Wound cleansed.  -Puraply applied, Lot [id number] exp 03/22/23; covered with mepitel, 4x4, kerlix, ACE. -Leave intact 1 week.  Cavus foot deformity, hammertoes -We did discuss possibility of correcting the deformity that is leading to ulceration.  Would consider staged procedures possible Dwyer osteotomy of calcaneus with or without further fifth metatarsal resection.  No follow-ups on file.

## 2021-01-08 ENCOUNTER — Other Ambulatory Visit: Payer: Self-pay

## 2021-01-08 ENCOUNTER — Ambulatory Visit (INDEPENDENT_AMBULATORY_CARE_PROVIDER_SITE_OTHER): Payer: Medicare Other | Admitting: Podiatry

## 2021-01-08 DIAGNOSIS — L97415 Non-pressure chronic ulcer of right heel and midfoot with muscle involvement without evidence of necrosis: Secondary | ICD-10-CM

## 2021-01-08 DIAGNOSIS — E11621 Type 2 diabetes mellitus with foot ulcer: Secondary | ICD-10-CM

## 2021-01-08 NOTE — Progress Notes (Signed)
  Subjective:  Patient ID: Katherine Herrera, female    DOB: February 11, 1966,  MRN: [id number]  Chief Complaint  Patient presents with  . Wound Check    Pt states concern of odor. Denies fever/chills/nausea/vomiting.   55 y.o. female presents for wound care. Hx confirmed with patient.  Objective:  Physical Exam: Wound Location: right 5th met base Wound Measurement: 2.5x2.5 Wound Base: hypergranular Peri-wound: HPKotic Exudate: Scant/small amount Serosanguinous exudate wound without warmth, erythema, signs of acute infection  Assessment:   1. Diabetic ulcer of right midfoot associated with type 2 diabetes mellitus, with muscle involvement without evidence of necrosis [facility])    Plan:  Patient was evaluated and treated and all questions answered.  Ulcer Right foot -Wound thoroughly scrubbed and debrided. -Puraply applied, Lot [id number] exp 03/09/23. Covered with mepitel, 4x4, kerlix, [place]. -Leave intact 1 week.  Cavus foot deformity, hammertoes -We did discuss possibility of correcting the deformity that is leading to ulceration.  Would consider staged procedures possible Dwyer osteotomy of calcaneus with or without further fifth metatarsal resection.  Return in about 1 week (around 05/03/8888) for Graft application.

## 2021-01-11 DIAGNOSIS — N189 Chronic kidney disease, unspecified: Secondary | ICD-10-CM | POA: Diagnosis not present

## 2021-01-11 DIAGNOSIS — E039 Hypothyroidism, unspecified: Secondary | ICD-10-CM | POA: Diagnosis not present

## 2021-01-11 DIAGNOSIS — I10 Essential (primary) hypertension: Secondary | ICD-10-CM | POA: Diagnosis not present

## 2021-01-11 DIAGNOSIS — E7800 Pure hypercholesterolemia, unspecified: Secondary | ICD-10-CM | POA: Diagnosis not present

## 2021-01-11 DIAGNOSIS — E1140 Type 2 diabetes mellitus with diabetic neuropathy, unspecified: Secondary | ICD-10-CM | POA: Diagnosis not present

## 2021-01-11 DIAGNOSIS — E1165 Type 2 diabetes mellitus with hyperglycemia: Secondary | ICD-10-CM | POA: Diagnosis not present

## 2021-01-19 ENCOUNTER — Other Ambulatory Visit: Payer: Self-pay

## 2021-01-19 ENCOUNTER — Ambulatory Visit: Payer: Medicare Other | Admitting: Podiatry

## 2021-01-19 ENCOUNTER — Ambulatory Visit (INDEPENDENT_AMBULATORY_CARE_PROVIDER_SITE_OTHER): Payer: Medicare Other | Admitting: Podiatry

## 2021-01-19 DIAGNOSIS — E11621 Type 2 diabetes mellitus with foot ulcer: Secondary | ICD-10-CM | POA: Diagnosis not present

## 2021-01-19 DIAGNOSIS — L97415 Non-pressure chronic ulcer of right heel and midfoot with muscle involvement without evidence of necrosis: Secondary | ICD-10-CM | POA: Diagnosis not present

## 2021-01-19 NOTE — Progress Notes (Signed)
  Subjective:  Patient ID: Katherine Herrera, female    DOB: 1966-05-01,  MRN: [id number]  Chief Complaint  Patient presents with  . Wound Check    Diabetic ulcer. Pt has no questions or concerns.    55 y.o. female presents for wound care. Hx confirmed with patient.  Objective:  Physical Exam: Wound Location: right 5th met base Wound Measurement: 1.8*2.4 Wound Base: hypergranular Peri-wound: HPKotic Exudate: Scant/small amount Serosanguinous exudate wound without warmth, erythema, signs of acute infection  Assessment:   1. Diabetic ulcer of right midfoot associated with type 2 diabetes mellitus, with muscle involvement without evidence of necrosis [place])    Plan:  Patient was evaluated and treated and all questions answered.  Ulcer Right foot -Wound scrubbed, mechanical debridement with wet gauze. Puraply applied, lot [id number], exp 02/04/23. Covered with mepitel, 4x4, kerlix, ACE -Leave intact 1 week.   Cavus foot deformity, hammertoes -Still may do possible surgery. Would consider staged procedures possible Dwyer osteotomy of calcaneus with or without further fifth metatarsal resection.  Return in about 1 week (around 01/26/2021).

## 2021-01-25 ENCOUNTER — Other Ambulatory Visit: Payer: Self-pay

## 2021-01-26 ENCOUNTER — Ambulatory Visit (INDEPENDENT_AMBULATORY_CARE_PROVIDER_SITE_OTHER): Payer: Medicare Other | Admitting: Internal Medicine

## 2021-01-26 ENCOUNTER — Encounter: Payer: Self-pay | Admitting: Internal Medicine

## 2021-01-26 ENCOUNTER — Ambulatory Visit (INDEPENDENT_AMBULATORY_CARE_PROVIDER_SITE_OTHER): Payer: Medicare Other | Admitting: Podiatry

## 2021-01-26 VITALS — BP 110/68 | HR 81 | Temp 98.2°F | Ht 69.5 in | Wt 219.6 lb

## 2021-01-26 DIAGNOSIS — E11621 Type 2 diabetes mellitus with foot ulcer: Secondary | ICD-10-CM

## 2021-01-26 DIAGNOSIS — E11610 Type 2 diabetes mellitus with diabetic neuropathic arthropathy: Secondary | ICD-10-CM | POA: Diagnosis not present

## 2021-01-26 DIAGNOSIS — Z23 Encounter for immunization: Secondary | ICD-10-CM | POA: Diagnosis not present

## 2021-01-26 DIAGNOSIS — M5440 Lumbago with sciatica, unspecified side: Secondary | ICD-10-CM | POA: Diagnosis not present

## 2021-01-26 DIAGNOSIS — E1140 Type 2 diabetes mellitus with diabetic neuropathy, unspecified: Secondary | ICD-10-CM | POA: Diagnosis not present

## 2021-01-26 DIAGNOSIS — E538 Deficiency of other specified B group vitamins: Secondary | ICD-10-CM

## 2021-01-26 DIAGNOSIS — L97415 Non-pressure chronic ulcer of right heel and midfoot with muscle involvement without evidence of necrosis: Secondary | ICD-10-CM

## 2021-01-26 DIAGNOSIS — R635 Abnormal weight gain: Secondary | ICD-10-CM

## 2021-01-26 MED ORDER — OXYCODONE HCL 15 MG PO TABS: 15.0000 mg | ORAL_TABLET | Freq: Four times a day (QID) | ORAL | 0 refills | Status: DC | PRN

## 2021-01-26 MED ORDER — DOXYCYCLINE HYCLATE 100 MG PO TABS: 100.0000 mg | ORAL_TABLET | Freq: Two times a day (BID) | ORAL | 0 refills | Status: DC

## 2021-01-26 NOTE — Assessment & Plan Note (Signed)
Stable wt lately

## 2021-01-26 NOTE — Assessment & Plan Note (Signed)
On Oxycodone 15 mg  Potential benefits of a long term opioids use as well as potential risks (i.e. addiction risk, apnea etc) and complications (i.e. Somnolence, constipation and others) were explained to the patient and were aknowledged. Risks of use w/benzodiazepines discussed

## 2021-01-26 NOTE — Progress Notes (Signed)
Subjective:  Patient ID: Katherine Herrera, female    DOB: 07/25/1966  Age: 55 y.o. MRN: [id number]  CC: Follow-up (3 month f/u- Pt want to know if she need to get pneumonia shot)   HPI Katherine Herrera presents for DM, HTN, chronic pain  Outpatient Medications Prior to Visit  Medication Sig Dispense Refill  . aspirin (BAYER ASPIRIN) 325 MG tablet Take 1 tablet (325 mg total) by mouth daily. 100 tablet 3  . B-D INS SYR ULTRAFINE 1CC/31G 31G X 5/16" 1 ML MISC USE AS DIRECTED 100 each 0  . baclofen (LIORESAL) 10 MG tablet TAKE 2 TABLETS BY MOUTH  TWICE DAILY 180 tablet 1  . bisacodyl (DULCOLAX) 5 MG EC tablet Take 10 mg by mouth at bedtime.    . Blood Glucose Monitoring Suppl (ONETOUCH VERIO) w/Device KIT     . clindamycin (CLEOCIN) 300 MG capsule Take 1 capsule (300 mg total) by mouth 2 (two) times daily. 14 capsule 0  . clonazePAM (KLONOPIN) 0.5 MG tablet Take 1 tablet (0.5 mg total) by mouth 2 (two) times daily as needed for anxiety. 60 tablet 1  . doxycycline (VIBRA-TABS) 100 MG tablet Take 1 tablet (100 mg total) by mouth 2 (two) times daily. 20 tablet 0  . ezetimibe (ZETIA) 10 MG tablet TAKE 1 TABLET BY MOUTH  DAILY 90 tablet 2  . fluticasone (FLONASE) 50 MCG/ACT nasal spray Place 2 sprays into both nostrils daily. (Patient taking differently: Place 2 sprays into both nostrils daily as needed.) 16 g 6  . gabapentin (NEURONTIN) 800 MG tablet TAKE 1 TABLET BY MOUTH 4  TIMES DAILY 360 tablet 3  . glucose blood (ONETOUCH VERIO) test strip Use to check blood sugar 2 times per day. 100 each 12  . insulin NPH-regular Human (70-30) 100 UNIT/ML injection Inject 10 Units into the skin 2 (two) times daily with a meal. (Patient taking differently: Inject into the skin as directed. 05-26-2020  Per pt 50 units an am and evening and at lunch time if cbg >200 it's 10 units) 10 mL 11  . Insulin Syringe-Needle U-100 (SURE COMFORT INSULIN SYRINGE) 31G X 5/16" 0.5 ML MISC USE AS DIRECTED WITH INSULIN 4 TIMES  A DAY 400 each 2  . lamoTRIgine (LAMICTAL) 25 MG tablet TAKE 1 TABLET BY MOUTH  TWICE DAILY 180 tablet 3  . Lancet Devices (ACCU-CHEK SOFTCLIX) lancets Test two times daily 100 each 12  . Lancets (ONETOUCH DELICA PLUS [id number]) MISC     . levothyroxine (SYNTHROID) 75 MCG tablet TAKE 1 TABLET BY MOUTH  DAILY 90 tablet 3  . lisinopril (ZESTRIL) 10 MG tablet TAKE 1 TABLET BY MOUTH  DAILY 90 tablet 2  . loratadine (CLARITIN) 10 MG tablet Take 1 tablet (10 mg total) by mouth daily as needed for allergies. 100 tablet 3  . ondansetron (ZOFRAN) 4 MG tablet 1 tablet    . oxyCODONE (ROXICODONE) 15 MG immediate release tablet Take 1 tablet (15 mg total) by mouth every 6 (six) hours as needed. 120 tablet 0  . rOPINIRole (REQUIP) 0.5 MG tablet TAKE 1 TABLET BY MOUTH AT  BEDTIME 90 tablet 3  . senna-docusate (SENOKOT-S) 8.6-50 MG tablet Take 1 tablet by mouth 2 (two) times daily. (Patient taking differently: Take 1 tablet by mouth at bedtime.) 20 tablet 0  . silver sulfADIAZINE (SILVADENE) 1 % cream Apply pea-sized amount to wound daily. 50 g 0  . traMADol (ULTRAM) 50 MG tablet 1 tablet as needed    .  vitamin B-12 (CYANOCOBALAMIN) 1000 MCG tablet Take 1,000 mcg by mouth daily.    Marland Kitchen zolpidem (AMBIEN) 10 MG tablet 1 tablet at bedtime as needed     No facility-administered medications prior to visit.    ROS: Review of Systems  Constitutional: Negative for activity change, appetite change, chills, fatigue, fever and unexpected weight change.  HENT: Negative for congestion, mouth sores and sinus pressure.   Eyes: Negative for visual disturbance.  Respiratory: Negative for cough and chest tightness.   Gastrointestinal: Negative for abdominal pain and nausea.  Genitourinary: Negative for difficulty urinating, frequency and vaginal pain.  Musculoskeletal: Positive for arthralgias, back pain and gait problem.  Skin: Negative for pallor and rash.  Neurological: Negative for dizziness, tremors, weakness,  numbness and headaches.  Psychiatric/Behavioral: Negative for confusion and sleep disturbance.    Objective:  BP 110/68 (BP Location: Left Arm)   Pulse 81   Temp 98.2 F (36.8 C) (Oral)   Ht 5' 9.5" (1.765 m)   Wt 219 lb 9.6 oz (99.6 kg)   SpO2 97%   BMI 31.96 kg/m   BP Readings from Last 3 Encounters:  01/26/21 110/68  12/22/20 110/70  10/21/20 122/78    Wt Readings from Last 3 Encounters:  01/26/21 219 lb 9.6 oz (99.6 kg)  12/22/20 218 lb 9.6 oz (99.2 kg)  10/21/20 215 lb 9.6 oz (97.8 kg)    Physical Exam Constitutional:      General: She is not in acute distress.    Appearance: She is well-developed. She is obese.  HENT:     Head: Normocephalic.     Right Ear: External ear normal.     Left Ear: External ear normal.     Nose: Nose normal.  Eyes:     General:        Right eye: No discharge.        Left eye: No discharge.     Conjunctiva/sclera: Conjunctivae normal.     Pupils: Pupils are equal, round, and reactive to light.  Neck:     Thyroid: No thyromegaly.     Vascular: No JVD.     Trachea: No tracheal deviation.  Cardiovascular:     Rate and Rhythm: Normal rate and regular rhythm.     Heart sounds: Normal heart sounds.  Pulmonary:     Effort: No respiratory distress.     Breath sounds: No stridor. No wheezing.  Abdominal:     General: Bowel sounds are normal. There is no distension.     Palpations: Abdomen is soft. There is no mass.     Tenderness: There is no abdominal tenderness. There is no guarding or rebound.  Musculoskeletal:        General: Tenderness and deformity present.     Cervical back: Normal range of motion and neck supple.     Right lower leg: No edema.     Left lower leg: No edema.  Lymphadenopathy:     Cervical: No cervical adenopathy.  Skin:    Findings: No erythema or rash.  Neurological:     Cranial Nerves: No cranial nerve deficit.     Motor: No abnormal muscle tone.     Coordination: Coordination normal.     Deep Tendon  Reflexes: Reflexes normal.  Psychiatric:        Behavior: Behavior normal.        Thought Content: Thought content normal.        Judgment: Judgment normal.   R footin a boot -  dressed  Lab Results  Component Value Date   WBC 7.5 01/14/2020   HGB 16.3 (H) 05/29/2020   HCT 48.0 (H) 05/29/2020   PLT 425 (H) 01/14/2020   GLUCOSE 130 (H) 10/21/2020   CHOL 223 (H) 07/19/2016   TRIG (H) 07/19/2016    527.0 Triglyceride is over 400; calculations on Lipids are invalid.   HDL 27.40 (L) 07/19/2016   LDLDIRECT 106.0 07/19/2016   LDLCALC 81 01/02/2013   ALT 13 10/21/2020   AST 12 10/21/2020   NA 135 10/21/2020   K 5.1 10/21/2020   CL 99 10/21/2020   CREATININE 1.04 10/21/2020   BUN 22 10/21/2020   CO2 32 10/21/2020   TSH 1.23 10/21/2020   INR 1.0 07/19/2019   HGBA1C 9.5 (H) 10/21/2020   MICROALBUR 0.9 07/19/2016    VAS [place] LOWER EXTREMITY VENOUS (DVT)  Result Date: 10/28/2020  Lower Venous DVT Study Indications: Pain and Edema involving the right lower extremity. Cellulitis.  Performing Technologist: Ronal Fear RVS, RCS  Examination Guidelines: A complete evaluation includes B-mode imaging, spectral Doppler, color Doppler, and power Doppler as needed of all accessible portions of each vessel. Bilateral testing is considered an integral part of a complete examination. Limited examinations for reoccurring indications may be performed as noted. The reflux portion of the exam is performed with the patient in reverse Trendelenburg.  +---------+---------------+---------+-----------+----------+--------------+ RIGHT    CompressibilityPhasicitySpontaneityPropertiesThrombus Aging +---------+---------------+---------+-----------+----------+--------------+ CFV      Full                                                        +---------+---------------+---------+-----------+----------+--------------+ SFJ      Full                                                         +---------+---------------+---------+-----------+----------+--------------+ FV Prox  Full                                                        +---------+---------------+---------+-----------+----------+--------------+ FV Mid   Full                                                        +---------+---------------+---------+-----------+----------+--------------+ FV DistalFull                                                        +---------+---------------+---------+-----------+----------+--------------+ POP      Full                                                        +---------+---------------+---------+-----------+----------+--------------+  PTV      Full                                                        +---------+---------------+---------+-----------+----------+--------------+ GSV      Full                                                        +---------+---------------+---------+-----------+----------+--------------+ SSV      Full                                                        +---------+---------------+---------+-----------+----------+--------------+    Findings reported to Nurse triage line at 2:40 pm.  Summary: RIGHT: - There is no evidence of deep vein thrombosis in the lower extremity. - There is no evidence of superficial venous thrombosis.  - No cystic structure found in the popliteal fossa.   *See table(s) above for measurements and observations. Electronically signed by Ruta Hinds MD on 10/28/2020 at 3:13:28 PM.    Final     Assessment & Plan:    Walker Kehr, MD

## 2021-01-26 NOTE — Progress Notes (Signed)
  Subjective:  Patient ID: Katherine Herrera, female    DOB: [birth year],  MRN: [id number]  Chief Complaint  Patient presents with  . Diabetic Ulcer       1wk fu for graft application PER DR Ahmeer Tuman   55 y.o. female presents for wound care. Hx confirmed with patient.  Objective:  Physical Exam: Wound Location: right 5th met base Wound Measurement: 2x2 Wound Base: hypergranular Peri-wound: intact Exudate: Scant/small amount Serosanguinous exudate wound without warmth, erythema, signs of acute infection  Assessment:   1. Diabetic ulcer of right midfoot associated with type 2 diabetes mellitus, with muscle involvement without evidence of necrosis [facility])    Plan:  Patient was evaluated and treated and all questions answered.  Ulcer Right foot -Wound scrubbed. Mild cellulitis noted. Malodor to wound. Mechanically debrided today. Rx doxycycline and hold off graft application today.  Cavus foot deformity, hammertoes -Still may do possible surgery. Would consider staged procedures possible Dwyer osteotomy of calcaneus with or without further fifth metatarsal resection.  No follow-ups on file.

## 2021-01-26 NOTE — Addendum Note (Signed)
Addended by: Earnstine Regal on: 01/26/2021 10:49 AM   Modules accepted: Orders

## 2021-01-26 NOTE — Assessment & Plan Note (Signed)
F/u w/dr Chalmers Cater On Insulins

## 2021-01-26 NOTE — Assessment & Plan Note (Signed)
R foot F/u w/dr March Rummage

## 2021-01-26 NOTE — Assessment & Plan Note (Signed)
On B12 

## 2021-01-27 ENCOUNTER — Emergency Department (HOSPITAL_COMMUNITY)
Admission: EM | Admit: 2021-01-27 | Discharge: 2021-01-28 | Disposition: A | Discharge status: Home / Self Care | Payer: Medicare Other | Attending: Emergency Medicine | Admitting: Emergency Medicine

## 2021-01-27 ENCOUNTER — Other Ambulatory Visit: Payer: Self-pay

## 2021-01-27 ENCOUNTER — Encounter (HOSPITAL_COMMUNITY): Payer: Self-pay | Admitting: Emergency Medicine

## 2021-01-27 DIAGNOSIS — Z794 Long term (current) use of insulin: Secondary | ICD-10-CM | POA: Diagnosis not present

## 2021-01-27 DIAGNOSIS — N289 Disorder of kidney and ureter, unspecified: Secondary | ICD-10-CM | POA: Diagnosis not present

## 2021-01-27 DIAGNOSIS — E039 Hypothyroidism, unspecified: Secondary | ICD-10-CM | POA: Insufficient documentation

## 2021-01-27 DIAGNOSIS — Z79899 Other long term (current) drug therapy: Secondary | ICD-10-CM | POA: Diagnosis not present

## 2021-01-27 DIAGNOSIS — E1169 Type 2 diabetes mellitus with other specified complication: Secondary | ICD-10-CM | POA: Diagnosis not present

## 2021-01-27 DIAGNOSIS — R0902 Hypoxemia: Secondary | ICD-10-CM | POA: Diagnosis not present

## 2021-01-27 DIAGNOSIS — R6889 Other general symptoms and signs: Secondary | ICD-10-CM | POA: Diagnosis not present

## 2021-01-27 DIAGNOSIS — E7800 Pure hypercholesterolemia, unspecified: Secondary | ICD-10-CM | POA: Insufficient documentation

## 2021-01-27 DIAGNOSIS — R109 Unspecified abdominal pain: Secondary | ICD-10-CM | POA: Diagnosis not present

## 2021-01-27 DIAGNOSIS — E1142 Type 2 diabetes mellitus with diabetic polyneuropathy: Secondary | ICD-10-CM | POA: Insufficient documentation

## 2021-01-27 DIAGNOSIS — F17210 Nicotine dependence, cigarettes, uncomplicated: Secondary | ICD-10-CM | POA: Insufficient documentation

## 2021-01-27 DIAGNOSIS — I10 Essential (primary) hypertension: Secondary | ICD-10-CM | POA: Diagnosis not present

## 2021-01-27 DIAGNOSIS — R55 Syncope and collapse: Secondary | ICD-10-CM | POA: Diagnosis not present

## 2021-01-27 DIAGNOSIS — J449 Chronic obstructive pulmonary disease, unspecified: Secondary | ICD-10-CM | POA: Diagnosis not present

## 2021-01-27 DIAGNOSIS — R1030 Lower abdominal pain, unspecified: Secondary | ICD-10-CM

## 2021-01-27 DIAGNOSIS — H579 Unspecified disorder of eye and adnexa: Secondary | ICD-10-CM | POA: Diagnosis not present

## 2021-01-27 DIAGNOSIS — Z743 Need for continuous supervision: Secondary | ICD-10-CM | POA: Diagnosis not present

## 2021-01-27 MED ORDER — LACTATED RINGERS IV BOLUS
1000.0000 mL | Freq: Once | INTRAVENOUS | Status: AC
  Administered 2021-01-27: 1000 mL via INTRAVENOUS

## 2021-01-27 NOTE — ED Provider Notes (Signed)
[facility] EMERGENCY DEPARTMENT Provider Note   CSN: [id number] Arrival date & time: 01/27/21  2213   History Chief Complaint  Patient presents with  . Abdominal Pain    Katherine Herrera is a 56 y.o. female.  The history is provided by the patient.  Abdominal Pain She has history of hypertension, diabetes, hyperlipidemia, COPD chronic osteomyelitis of the right foot and ankle and comes in because of an episode of feeling weak and light-headed.  She had onset about 8 PM of feeling weak and lightheaded and thinks she may have had a syncopal episode.  She was also sweaty.  She denies chest pain, heaviness, tightness, pressure.  She denies any dyspnea or nausea.  Shortly after that, she started developing lower abdominal pain.  Pain is severe and she rates it a 10/10.  Nothing makes it better, nothing makes it worse.  There is no radiation of pain.  Heard dizziness is feeling better, but the pain is worsening.  Of note, she was discharged on doxycycline today because of her diabetic foot ulcer.  Past Medical History:  Diagnosis Date  . Anxiety   . Arthritis    hands  . B12 deficiency   . Chronic constipation   . Chronic pain syndrome    neck, lower back, and foot  . COPD (chronic obstructive pulmonary disease) ([place])   . Diabetic ulcer of right foot ([place])   . Gout    possible  . History of seizure    05-26-2020  per pt as child and last one in late teen's  . History of sepsis    due to lower extremity cellulits 2019 left , right 03/ 2021  . History of syncope   . Hyperlipidemia   . Hypothyroidism   . Impaired range of motion of cervical spine    per pt hx cervical fusion C4 -- 7,  has to be careful about turning her head to the right can cause her to pass out  . Insulin dependent type 2 diabetes mellitus [facility])    endocrinologist--- dr Chalmers Cater    (05-26-2020 per pt check's blood sugar twice dialy and at times a third time,  fasting am blood sugar's---- 200 -- 250)  . LBP (low back  pain)    DR Patrice Paradise  . Migraine   . Peripheral neuropathy   . RBBB (right bundle branch block)   . Tenosynovitis of foot and ankle 09/03/2013  . TTS (tarsal tunnel syndrome) 2010   Left Foot [place] in [place]  . Vitamin D deficiency   . Wears glasses     Patient Active Problem List   Diagnosis Date Noted  . Essential hypertension 10/09/2020  . Pure hypercholesterolemia 10/09/2020  . Type 2 diabetes mellitus with hyperglycemia ([place]) 10/09/2020  . Chronic osteomyelitis involving right ankle and foot ([place])   . Cellulitis of right lower leg 01/10/2020  . Cellulitis 01/02/2020  . Skin cyanosis 01/02/2020  . Soft tissue infection 01/01/2020  . Septic shock ([place]) 12/31/2019  . Peroneal tendonitis, right   . Right foot ulcer ([place]) 07/04/2019  . Capsulitis of metatarsophalangeal (MTP) joint of right foot   . Exostosis of bone of foot   . Preop exam for internal medicine 12/19/2018  . Polyneuropathy due to type 2 diabetes mellitus ([place]) 10/01/2018  . Mild renal insufficiency 09/29/2018  . Hyponatremia 09/29/2018  . Chronic pain 09/29/2018  . Cellulitis of left lower leg   . Diabetic foot ulcer ([place])   . Sepsis due to  cellulitis ([place]) 09/28/2018  . Syncope 07/30/2018  . Earache on right 08/02/2017  . Cerumen impaction 08/02/2017  . Constipation due to pain medication 05/22/2017  . Elevated liver enzymes 05/22/2017  . AKI (acute kidney injury) ([place]) 05/03/2017  . Charcot foot due to diabetes mellitus ([place]) 04/23/2017  . Leg swelling 10/28/2016  . Knee contusion 01/13/2016  . Pain from implanted hardware 01/06/2016  . Cellulitis and abscess of toe of right foot 07/24/2015  . Nausea without vomiting 07/24/2015  . Leg edema, left 06/19/2015  . DOE (dyspnea on exertion) 06/16/2015  . Chest pain, atypical 06/16/2015  . Family history of musculoskeletal disease 03/12/2015  . Non-compliant behavior 12/24/2014  . Ulcer of right foot limited to breakdown of skin ([place]) 09/02/2014  . Ulcer of  foot due to diabetes mellitus ([place]) 08/12/2014  . Ulcer of other part of foot 06/20/2014  . Leg pain, left 03/31/2014  . Callus of foot 03/25/2014  . 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 Rash 01/28/2014  . Pain in lower limb 01/24/2014  . Keratosis 01/24/2014  . Leg cramps 10/09/2013  . Vaginitis and vulvovaginitis 10/09/2013  . Ingrown nail 09/30/2013  . Pain in toe of left foot 09/30/2013  . Tenosynovitis of foot and ankle 09/03/2013  . Metatarsal deformity, left 09/03/2013  . Pain in joint, ankle and foot 01/22/2013  . Pes cavus 10/13/2012  . Acquired tight Achilles tendon 10/13/2012  . Paronychia 06/14/2012  . 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 ([place]) 03/19/2012  . Allergic rhinitis 03/19/2012  . URI (upper respiratory infection) 08/30/2011  . Myalgia 04/18/2011  . SHOULDER PAIN 01/17/2011  . TTS (tarsal tunnel syndrome)   . GASTROENTERITIS 10/18/2010  . ARTHRALGIA 01/04/2010  . WEIGHT GAIN, ABNORMAL 01/04/2010  . TACHYCARDIA 10/02/2009  . CBC, ABNORMAL 05/01/2009  . B12 deficiency 01/05/2009  . GOUT 01/05/2009  . Edema 01/05/2009  . Anxiety disorder 07/28/2008  . ELBOW PAIN 07/28/2008  . TOBACCO USE DISORDER/SMOKER-SMOKING CESSATION DISCUSSED 04/21/2008  . FOOT PAIN 04/21/2008  . PARESTHESIA 04/21/2008  . Abdominal pain 04/21/2008  . SINUSITIS- ACUTE-NOS 11/13/2007  . COPD exacerbation ([place]) 11/13/2007  . Diabetic neuropathy ([place]) 08/27/2007  . LOW BACK PAIN 08/27/2007  . Dyslipidemia 08/20/2007  . Hypothyroidism 05/22/2007    Past Surgical History:  Procedure Laterality Date  . ANTERIOR CERVICAL DECOMP/DISCECTOMY FUSION  02-12-2004;  07-06-2006   C4--5 in 2005;  C5 --7  in  2007  . APPLICATION OF WOUND VAC Right 07/05/2019   Procedure: Application Of Wound Vac;  Surgeon: Evelina Bucy, DPM;  Location: [place];  Service: Podiatry;  Laterality: Right;  . BONE BIOPSY Right 05/29/2020   Procedure: BONE BIOPSY;  Surgeon: Evelina Bucy, DPM;  Location:  [place];  Service: Podiatry;  Laterality: Right;  . CARDIAC CATHETERIZATION  05-27-2002   dr Einar Gip   for positive cardiolite;  normal coronaries and LVF, ef 70%  . CARPAL TUNNEL RELEASE Bilateral left 12-25-2008;  right 09-22-2009  both _0   . HARDWARE REMOVAL  01-07-2016  _1    right foot  . INCISION AND DRAINAGE OF WOUND Right 07/20/2019   Procedure: IRRIGATION AND DEBRIDEMENT foot;  Surgeon: Evelina Bucy, DPM;  Location: [facility];  Service: Podiatry;  Laterality: Right;  . INCISION AND DRAINAGE OF WOUND Right 07/22/2019   Procedure: IRRIGATION AND DEBRIDEMENT RIGHT FOOT; APPLICATION OF WOUND VAC;  Surgeon: Evelina Bucy, DPM;  Location: [facility];  Service: Podiatry;  Laterality: Right;  . INCISION AND DRAINAGE OF  WOUND Right 07/24/2019   Procedure: IRRIGATION AND DEBRIDEMENT WOUND, APPLICATION OF WOUND VAC;  Surgeon: Evelina Bucy, DPM;  Location: [facility];  Service: Podiatry;  Laterality: Right;  . INCISION AND DRAINAGE OF WOUND Right 05/29/2020   Procedure: RIGHT FOOT WOUND DEBRIDEMENT AND IRRIGATION;  Surgeon: Evelina Bucy, DPM;  Location: [place];  Service: Podiatry;  Laterality: Right;  . LUMBAR DISC SURGERY  07-07-2005   _0    L5 - S1  . METATARSAL HEAD EXCISION Bilateral 01/02/2019   Procedure: Left foot 5th metatarsal resection, wound excision, adjacent tissue transfer. Right fifth metatarsal head resection, fitfth metatarsal base resection, intermediate wound repair.;  Surgeon: Evelina Bucy, DPM;  Location: [place];  Service: Podiatry;  Laterality: Bilateral;  . METATARSAL OSTEOTOMY Right 07/05/2019   Procedure: RIGHT FOOT 5TH METATARSAL RESECTION; PERONEAL TENDONESIS, EXCISION OF ULCER, LOCAL TISSUE TRANSFER ;  Surgeon: Evelina Bucy, DPM;  Location: [place];  Service: Podiatry;  Laterality: Right;  . ORIF METATARSAL FRACTURE  09-22-2015  _1    right 5th  . TOTAL ABDOMINAL HYSTERECTOMY W/ BILATERAL SALPINGOOPHORECTOMY   2000 approx.     OB History   No obstetric history on file.     Family History  Problem Relation Age of Onset  . Diabetes Mother   . Hypertension Mother   . Cancer Father 54       ? bone cancer  . [name] Brother     Social History   Tobacco Use  . Smoking status: Current Every Day Smoker    Packs/day: 1.00    Years: 8.00    Pack years: 8.00    Types: Cigarettes  . Smokeless tobacco: Never Used  Vaping Use  . Vaping Use: Never used  Substance Use Topics  . Alcohol use: No    Alcohol/week: 0.0 standard drinks  . Drug use: No    Home Medications Prior to Admission medications   Medication Sig Start Date End Date Taking? Authorizing Provider  aspirin (BAYER ASPIRIN) 325 MG tablet Take 1 tablet (325 mg total) by mouth daily. 06/16/15   Plotnikov, [name], MD  B-D INS SYR ULTRAFINE 1CC/31G 31G X 5/16" 1 ML MISC USE AS DIRECTED 09/08/16   Renato Shin, MD  baclofen (LIORESAL) 10 MG tablet TAKE 2 TABLETS BY MOUTH  TWICE DAILY 11/16/20   Plotnikov, [name], MD  bisacodyl (DULCOLAX) 5 MG EC tablet Take 10 mg by mouth at bedtime.    [provider]  Blood Glucose Monitoring Suppl (ONETOUCH VERIO) w/Device KIT  11/28/18   [provider]  clindamycin (CLEOCIN) 300 MG capsule Take 1 capsule (300 mg total) by mouth 2 (two) times daily. 05/29/20   Evelina Bucy, DPM  clonazePAM (KLONOPIN) 0.5 MG tablet Take 1 tablet (0.5 mg total) by mouth 2 (two) times daily as needed for anxiety. 11/24/20   Plotnikov, [name], MD  doxycycline (VIBRA-TABS) 100 MG tablet Take 1 tablet (100 mg total) by mouth 2 (two) times daily. 01/26/21   Evelina Bucy, DPM  ezetimibe (ZETIA) 10 MG tablet TAKE 1 TABLET BY MOUTH  DAILY 12/24/20   Plotnikov, [name], MD  fluticasone (FLONASE) 50 MCG/ACT nasal spray Place 2 sprays into both nostrils daily. Patient taking differently: Place 2 sprays into both nostrils daily as needed. 04/21/20   Plotnikov, [name], MD  gabapentin  (NEURONTIN) 800 MG tablet TAKE 1 TABLET BY MOUTH 4  TIMES DAILY 07/09/20   Plotnikov, [name], MD  glucose blood (ONETOUCH VERIO) test strip Use to check blood sugar 2 times per day. 06/24/16   Renato Shin, MD  insulin NPH-regular Human (70-30) 100 UNIT/ML injection Inject 10 Units into the skin 2 (two) times daily with a meal. Patient taking differently: Inject into the skin as directed. 05-26-2020  Per pt 50 units an am and evening and at lunch time if cbg >200 it's 10 units 07/25/19   Dessa Phi, DO  Insulin Syringe-Needle U-100 (SURE COMFORT INSULIN SYRINGE) 31G X 5/16" 0.5 ML MISC USE AS DIRECTED WITH INSULIN 4 TIMES A DAY 09/08/17   Renato Shin, MD  lamoTRIgine (LAMICTAL) 25 MG tablet TAKE 1 TABLET BY MOUTH  TWICE DAILY 05/29/20   Plotnikov, [name], MD  Lancet Devices (ACCU-CHEK [facility]) lancets Test two times daily 07/01/16   Renato Shin, MD  Lancets (ONETOUCH DELICA PLUS [id number]) [place]  11/07/19   [provider]  levothyroxine (SYNTHROID) 75 MCG tablet TAKE 1 TABLET BY MOUTH  DAILY 07/09/20   Plotnikov, [name], MD  lisinopril (ZESTRIL) 10 MG tablet TAKE 1 TABLET BY MOUTH  DAILY 12/24/20   Plotnikov, [name], MD  loratadine (CLARITIN) 10 MG tablet Take 1 tablet (10 mg total) by mouth daily as needed for allergies. 04/21/20   Plotnikov, [name], MD  ondansetron (ZOFRAN) 4 MG tablet 1 tablet    [provider]  oxyCODONE (ROXICODONE) 15 MG immediate release tablet Take 1 tablet (15 mg total) by mouth every 6 (six) hours as needed. 01/26/21   Plotnikov, [name], MD  rOPINIRole (REQUIP) 0.5 MG tablet TAKE 1 TABLET BY MOUTH AT  BEDTIME 07/09/20   Plotnikov, [name], MD  senna-docusate (SENOKOT-S) 8.6-50 MG tablet Take 1 tablet by mouth 2 (two) times daily. Patient taking differently: Take 1 tablet by mouth at bedtime. 07/10/19   Aline August, MD  silver sulfADIAZINE (SILVADENE) 1 % cream Apply pea-sized amount to wound daily. 10/09/20   Evelina Bucy, DPM  traMADol  (ULTRAM) 50 MG tablet 1 tablet as needed    [provider]  vitamin B-12 (CYANOCOBALAMIN) 1000 MCG tablet Take 1,000 mcg by mouth daily.    [provider]  zolpidem (AMBIEN) 10 MG tablet 1 tablet at bedtime as needed    [provider]    Allergies    Cephalexin, Demerol  [meperidine hcl], Lovastatin, Metformin and related, Sulfamethoxazole-trimethoprim, Crestor [rosuvastatin calcium], Niacin, Other, Hydromorphone hcl, and Paroxetine  Review of Systems   Review of Systems  Gastrointestinal: Positive for abdominal pain.  All other systems reviewed and are negative.   Physical Exam Updated Vital Signs BP (!) 84/57 (BP Location: Left Arm)   Pulse 83   Temp 97.9 F (36.6 C) (Oral)   Resp 17   Wt 100 kg   SpO2 94%   BMI 32.09 kg/m   Physical Exam Vitals and nursing note reviewed.   55 year old female, resting comfortably and in no acute distress. Vital signs are significant for low blood pressure. Oxygen saturation is 94%, which is normal. Head is normocephalic and atraumatic. PERRLA, EOMI. Oropharynx is clear. Neck is nontender and supple without adenopathy or JVD. Back is nontender and there is no CVA tenderness. Lungs are clear without rales, wheezes, or rhonchi. Chest is nontender. Heart has regular rate and rhythm without murmur. Abdomen is soft, flat, with moderate tenderness across the suprapubic area.  There is no rebound or guarding.  There are no masses or hepatosplenomegaly and peristalsis is hypoactive. Extremities have  no cyanosis or edema, full range of motion is present.  Right foot is bandaged, bandages not removed. Skin is warm and dry without rash. Neurologic: Mental status is normal, cranial nerves are intact, there are no motor or sensory deficits.  ED Results / Procedures / Treatments   Labs (all labs ordered are listed, but only abnormal results are displayed) Labs Reviewed  COMPREHENSIVE METABOLIC PANEL - Abnormal; Notable  for the following components:      Result Value   Glucose, Bld 148 (*)    BUN 27 (*)    Creatinine, Ser 1.17 (*)    Calcium 8.2 (*)    Albumin 3.2 (*)    GFR, Estimated 55 (*)    All other components within normal limits  CBC WITH DIFFERENTIAL/PLATELET - Abnormal; Notable for the following components:   WBC 11.1 (*)    Neutro Abs 7.8 (*)    All other components within normal limits  LIPASE, BLOOD  LACTIC ACID, PLASMA  TROPONIN I (HIGH SENSITIVITY)  TROPONIN I (HIGH SENSITIVITY)    EKG EKG Interpretation  Date/Time:  Wednesday January 27 2021 23:40:04 EDT Ventricular Rate:  70 PR Interval:  145 QRS Duration: 165 QT Interval:  420 QTC Calculation: 454 R Axis:   97 Text Interpretation: Sinus rhythm Probable left atrial enlargement RBBB and LPFB When compared with ECG of 07/22/2019, No significant change was found Confirmed by Delora Fuel ([id number]) on 01/27/2021 11:44:45 PM   Radiology CT ABDOMEN PELVIS W CONTRAST  Result Date: 01/28/2021 CLINICAL DATA:  Lower abdominal pain for several hours EXAM: CT ABDOMEN AND PELVIS WITH CONTRAST TECHNIQUE: Multidetector CT imaging of the abdomen and pelvis was performed using the standard protocol following bolus administration of intravenous contrast. CONTRAST:  123m OMNIPAQUE IOHEXOL 300 MG/ML  SOLN COMPARISON:  02/20/2019 FINDINGS: Lower chest: No acute abnormality. Hepatobiliary: No focal liver abnormality is seen. No gallstones, gallbladder wall thickening, or biliary dilatation. Pancreas: Unremarkable. No pancreatic ductal dilatation or surrounding inflammatory changes. Spleen: Normal in size without focal abnormality. Adrenals/Urinary Tract: Adrenal glands are within normal limits. Kidneys demonstrate a normal enhancement pattern bilaterally. No renal calculi or obstructive changes are noted. Normal excretion of contrast is noted on delayed images. The bladder is within normal limits. Stomach/Bowel: No obstructive or inflammatory changes of the  colon are noted. The appendix is not well visualized and may have been surgically removed. No obstructive changes are seen. Mild retained fecal material is noted. Small bowel and stomach appear within normal limits. Vascular/Lymphatic: Aortic atherosclerosis. No enlarged abdominal or pelvic lymph nodes. Left retroaortic renal vein is noted. Reproductive: Status post hysterectomy. No adnexal masses. Other: No abdominal wall hernia or abnormality. No abdominopelvic ascites. Musculoskeletal: No acute or significant osseous findings. IMPRESSION: Changes consistent with mild constipation. No other acute abnormality is noted. Electronically Signed   By: MInez CatalinaM.D.   On: 01/28/2021 01:01    Procedures Procedures   Medications Ordered in ED Medications - No data to display  ED Course  I have reviewed the triage vital signs and the nursing notes.  Pertinent labs & imaging results that were available during my care of the patient were reviewed by me and considered in my medical decision making (see chart for details).  MDM Rules/Calculators/A&P Lower abdominal pain with episode of hypotension and near syncope versus syncope.  Initial blood pressure at triage was low, blood pressure when she was in the room was up to 1563systolic, which is still low for patient with history  of hypertension.  She is status post appendectomy and hysterectomy, most likely cause of pain is diverticulitis, consider abdominal aneurysm, urinary tract infection.  This differential includes conditions with significant morbidity and mortality.  She will be given IV fluids and screening labs obtained and will be sent for CT of abdomen and pelvis.  Old records were reviewed, and she had a CT abdomen and pelvis 2 years ago showing aortic atherosclerosis but no aneurysm.  ECG is unchanged from prior.  Labs show mild renal insufficiency with creatinine not significantly changed from prior, mild leukocytosis with normal differential,  normal lactic acid level.  CT scan showed no acute process, mild constipation.  Blood pressure has come up following IV fluids, I suspect that she may have had a vasovagal reaction.  She continues to complain of abdominal pain, will give a dose of morphine.  Pain has improved following morphine, but is still present.  Vital signs have been stable since she got her IV fluids, will check orthostatic vital signs.  Because of CT finding suggesting constipation, she is given a dose of bisacodyl.  Orthostatic vital signs showed no significant change in pulse or blood pressure.  She is felt to be safe for discharge, but will need to follow-up with primary care provider.  Given strict return precautions.  Final Clinical Impression(s) / ED Diagnoses Final diagnoses:  Near syncope  Abdominal pain, lower  Renal insufficiency    Rx / DC Orders ED Discharge Orders    None       Delora Fuel, MD 41/28/20 [phone]

## 2021-01-27 NOTE — ED Triage Notes (Signed)
Pt c/o lower abd pain that started tonight. On ems arrival pt was having a panic attack per emt.

## 2021-01-28 ENCOUNTER — Emergency Department (HOSPITAL_COMMUNITY): Payer: Medicare Other

## 2021-01-28 ENCOUNTER — Encounter (HOSPITAL_COMMUNITY): Payer: Self-pay | Admitting: Radiology

## 2021-01-28 DIAGNOSIS — R109 Unspecified abdominal pain: Secondary | ICD-10-CM | POA: Diagnosis not present

## 2021-01-28 DIAGNOSIS — N289 Disorder of kidney and ureter, unspecified: Secondary | ICD-10-CM | POA: Diagnosis not present

## 2021-01-28 LAB — CBC WITH DIFFERENTIAL/PLATELET
Abs Immature Granulocytes: 0.05 10*3/uL (ref 0.00–0.07)
Basophils Absolute: 0.1 10*3/uL (ref 0.0–0.1)
Basophils Relative: 1 %
Eosinophils Absolute: 0.3 10*3/uL (ref 0.0–0.5)
Eosinophils Relative: 2 %
HCT: 41.1 % (ref 36.0–46.0)
Hemoglobin: 13 g/dL (ref 12.0–15.0)
Immature Granulocytes: 1 %
Lymphocytes Relative: 19 %
Lymphs Abs: 2.2 10*3/uL (ref 0.7–4.0)
MCH: 27.1 pg (ref 26.0–34.0)
MCHC: 31.6 g/dL (ref 30.0–36.0)
MCV: 85.8 fL (ref 80.0–100.0)
Monocytes Absolute: 0.8 10*3/uL (ref 0.1–1.0)
Monocytes Relative: 7 %
Neutro Abs: 7.8 10*3/uL — ABNORMAL HIGH (ref 1.7–7.7)
Neutrophils Relative %: 70 %
Platelets: 327 10*3/uL (ref 150–400)
RBC: 4.79 MIL/uL (ref 3.87–5.11)
RDW: 14.8 % (ref 11.5–15.5)
WBC: 11.1 10*3/uL — ABNORMAL HIGH (ref 4.0–10.5)
nRBC: 0 % (ref 0.0–0.2)

## 2021-01-28 LAB — COMPREHENSIVE METABOLIC PANEL
ALT: 16 U/L (ref 0–44)
AST: 16 U/L (ref 15–41)
Albumin: 3.2 g/dL — ABNORMAL LOW (ref 3.5–5.0)
Alkaline Phosphatase: 94 U/L (ref 38–126)
Anion gap: 9 (ref 5–15)
BUN: 27 mg/dL — ABNORMAL HIGH (ref 6–20)
CO2: 26 mmol/L (ref 22–32)
Calcium: 8.2 mg/dL — ABNORMAL LOW (ref 8.9–10.3)
Chloride: 101 mmol/L (ref 98–111)
Creatinine, Ser: 1.17 mg/dL — ABNORMAL HIGH (ref 0.44–1.00)
GFR, Estimated: 55 mL/min — ABNORMAL LOW (ref >60)
Glucose, Bld: 148 mg/dL — ABNORMAL HIGH (ref 70–99)
Potassium: 3.9 mmol/L (ref 3.5–5.1)
Sodium: 136 mmol/L (ref 135–145)
Total Bilirubin: 0.3 mg/dL (ref 0.3–1.2)
Total Protein: 7.3 g/dL (ref 6.5–8.1)

## 2021-01-28 LAB — TROPONIN I (HIGH SENSITIVITY)
Troponin I (High Sensitivity): 2 ng/L (ref <18)
Troponin I (High Sensitivity): 3 ng/L (ref <18)

## 2021-01-28 LAB — LIPASE, BLOOD: Lipase: 19 U/L (ref 11–51)

## 2021-01-28 LAB — LACTIC ACID, PLASMA: Lactic Acid, Venous: 1.5 mmol/L (ref 0.5–1.9)

## 2021-01-28 MED ORDER — BISACODYL 5 MG PO TBEC
5.0000 mg | DELAYED_RELEASE_TABLET | Freq: Once | ORAL | Status: DC
  Filled 2021-01-28: qty 1

## 2021-01-28 MED ORDER — MORPHINE SULFATE (PF) 4 MG/ML IV SOLN
4.0000 mg | Freq: Once | INTRAVENOUS | Status: AC
  Administered 2021-01-28: 4 mg via INTRAVENOUS
  Filled 2021-01-28: qty 1

## 2021-01-28 MED ORDER — IOHEXOL 300 MG/ML  SOLN
100.0000 mL | Freq: Once | INTRAMUSCULAR | Status: AC | PRN
  Administered 2021-01-28: 100 mL via INTRAVENOUS

## 2021-01-28 NOTE — Discharge Instructions (Signed)
Return if you are having any further problems. 

## 2021-02-02 ENCOUNTER — Ambulatory Visit (INDEPENDENT_AMBULATORY_CARE_PROVIDER_SITE_OTHER): Payer: Medicare Other | Admitting: Podiatry

## 2021-02-02 ENCOUNTER — Other Ambulatory Visit: Payer: Self-pay

## 2021-02-02 DIAGNOSIS — L97415 Non-pressure chronic ulcer of right heel and midfoot with muscle involvement without evidence of necrosis: Secondary | ICD-10-CM

## 2021-02-02 DIAGNOSIS — E11621 Type 2 diabetes mellitus with foot ulcer: Secondary | ICD-10-CM | POA: Diagnosis not present

## 2021-02-09 ENCOUNTER — Other Ambulatory Visit: Payer: Self-pay

## 2021-02-09 ENCOUNTER — Ambulatory Visit (INDEPENDENT_AMBULATORY_CARE_PROVIDER_SITE_OTHER): Payer: Medicare Other | Admitting: Podiatry

## 2021-02-09 ENCOUNTER — Encounter: Payer: Self-pay | Admitting: Podiatry

## 2021-02-09 DIAGNOSIS — E11621 Type 2 diabetes mellitus with foot ulcer: Secondary | ICD-10-CM | POA: Diagnosis not present

## 2021-02-09 DIAGNOSIS — L97415 Non-pressure chronic ulcer of right heel and midfoot with muscle involvement without evidence of necrosis: Secondary | ICD-10-CM | POA: Diagnosis not present

## 2021-02-23 ENCOUNTER — Other Ambulatory Visit: Payer: Self-pay

## 2021-02-23 ENCOUNTER — Ambulatory Visit (INDEPENDENT_AMBULATORY_CARE_PROVIDER_SITE_OTHER): Payer: Medicare Other | Admitting: Podiatry

## 2021-02-23 DIAGNOSIS — E11621 Type 2 diabetes mellitus with foot ulcer: Secondary | ICD-10-CM | POA: Diagnosis not present

## 2021-02-23 DIAGNOSIS — L97415 Non-pressure chronic ulcer of right heel and midfoot with muscle involvement without evidence of necrosis: Secondary | ICD-10-CM

## 2021-02-23 DIAGNOSIS — R600 Localized edema: Secondary | ICD-10-CM | POA: Diagnosis not present

## 2021-02-23 NOTE — Progress Notes (Signed)
  Subjective:  Patient ID: Katherine Herrera, female    DOB: September 30, 1966,  MRN: [id number]  Chief Complaint  Patient presents with  . PHQ-9 12 Week Follow-up  . Follow-up    1wk fu for graft application    55 y.o. female presents for wound care. Hx confirmed with patient. Recently traveled to [place]. States her right foot is hurting more on the top. Objective:  Physical Exam: Wound Location: right 5th met base Wound Measurement: 1.5x1.5 Wound Base: granular Peri-wound: hyperkeratotic Exudate: Scant/small amount Serosanguinous exudate wound without warmth, erythema, signs of acute infection  Local edema dorsal foot pitting. No calf pain. Negative Homan's sign.  Assessment:   1. Diabetic ulcer of right midfoot associated with type 2 diabetes mellitus, with muscle involvement without evidence of necrosis ([place])   2. Edema of right lower leg    Plan:  Patient was evaluated and treated and all questions answered.  Ulcer Right foot -Wound cleansed and debrided. Puraply graft [id number] applied, exp 03/29/23. Covered with mepitel, 4x4, ABD, ACE bandage.  Right foot edema, recent travel -Will order [place] to r/o DVT  Cavus foot deformity, hammertoes -Still may do possible surgery. Would consider staged procedures possible Dwyer osteotomy of calcaneus with or without further fifth metatarsal resection.  Return in about 1 week (around 03/02/2021) for Wound Care graft .

## 2021-02-23 NOTE — Progress Notes (Signed)
  Subjective:  Patient ID: Katherine Herrera, female    DOB: 02/25/1966,  MRN: [id number]  Chief Complaint  Patient presents with  . Wound Check    Diabetic wound check and graft application. Pt states no changes.    55 y.o. female presents for wound care. Hx confirmed with patient.  Objective:  Physical Exam: Wound Location: right 5th met base Wound Measurement: 2.2*2.3 Wound Base: hypergranular Peri-wound: intact Exudate: Scant/small amount Serosanguinous exudate wound without warmth, erythema, signs of acute infection  Assessment:   1. Diabetic ulcer of right midfoot associated with type 2 diabetes mellitus, with muscle involvement without evidence of necrosis [facility])    Plan:  Patient was evaluated and treated and all questions answered.  Ulcer Right foot -Graft applied -Puraply Lots [id number] exp 02/27/21, covered with mepitel, 4x4, kerlix, ACE  Cavus foot deformity, hammertoes -Still may do possible surgery. Would consider staged procedures possible Dwyer osteotomy of calcaneus with or without further fifth metatarsal resection.  Return in about 1 week (around 02/09/2021) for Wound Care, graft application.

## 2021-02-24 ENCOUNTER — Ambulatory Visit (HOSPITAL_COMMUNITY)
Admission: RE | Admit: 2021-02-24 | Discharge: 2021-02-24 | Disposition: A | Discharge status: Home / Self Care | Payer: Medicare Other | Source: Ambulatory Visit | Attending: Podiatry | Admitting: Podiatry

## 2021-02-24 DIAGNOSIS — L97415 Non-pressure chronic ulcer of right heel and midfoot with muscle involvement without evidence of necrosis: Secondary | ICD-10-CM | POA: Diagnosis not present

## 2021-02-24 DIAGNOSIS — E11621 Type 2 diabetes mellitus with foot ulcer: Secondary | ICD-10-CM | POA: Diagnosis not present

## 2021-02-25 ENCOUNTER — Telehealth: Payer: Self-pay | Admitting: Podiatry

## 2021-02-25 NOTE — Telephone Encounter (Signed)
Patient called stating she would be able to make appointment May 3rd, I offered to reschedule for May 10th but patient stated that was too far and she could just take it off herself because she has done so in the past, Please Advise

## 2021-03-01 DIAGNOSIS — I10 Essential (primary) hypertension: Secondary | ICD-10-CM | POA: Diagnosis not present

## 2021-03-01 DIAGNOSIS — E039 Hypothyroidism, unspecified: Secondary | ICD-10-CM | POA: Diagnosis not present

## 2021-03-01 DIAGNOSIS — E1140 Type 2 diabetes mellitus with diabetic neuropathy, unspecified: Secondary | ICD-10-CM | POA: Diagnosis not present

## 2021-03-01 DIAGNOSIS — E7800 Pure hypercholesterolemia, unspecified: Secondary | ICD-10-CM | POA: Diagnosis not present

## 2021-03-01 DIAGNOSIS — N189 Chronic kidney disease, unspecified: Secondary | ICD-10-CM | POA: Diagnosis not present

## 2021-03-01 DIAGNOSIS — E1165 Type 2 diabetes mellitus with hyperglycemia: Secondary | ICD-10-CM | POA: Diagnosis not present

## 2021-03-02 ENCOUNTER — Ambulatory Visit: Payer: Medicare Other | Admitting: Podiatry

## 2021-03-04 ENCOUNTER — Other Ambulatory Visit: Payer: Self-pay | Admitting: Internal Medicine

## 2021-03-04 NOTE — Progress Notes (Signed)
  Subjective:  Patient ID: Katherine Herrera, female    DOB: 08-11-1966,  MRN: [id number]  No chief complaint on file.   55 y.o. female presents for wound care.  States she is going out of town and would prefer not to have a graft applied.  Is concerned she is going to do a lot of walking however  A1c 9.5 Objective:  Physical Exam: Wound Location: right lateral foot Wound Measurement: 2*2 Wound Base: Granular/Healthy Peri-wound: Normal Exudate: None: wound tissue dry wound without warmth, erythema, signs of acute infection Assessment:   1. Diabetic ulcer of right midfoot associated with type 2 diabetes mellitus, with muscle involvement without evidence of necrosis [facility])     Plan:  Patient was evaluated and treated and all questions answered.  Ulcer right foot -Wound minimally debrided.  Thoroughly cleansed. -PuraPly applied to the wound. Lot [id number] exp 04/05/23 -New short cam boot dispensed.  I advised her to wear this when she is doing a lot of activity -Advised to keep the dressing for as long as possible ideally up to a week however if she has remove this earlier I am fine with that if it has been at least 4-5 days  No follow-ups on file.

## 2021-03-09 ENCOUNTER — Other Ambulatory Visit: Payer: Self-pay

## 2021-03-09 ENCOUNTER — Ambulatory Visit (INDEPENDENT_AMBULATORY_CARE_PROVIDER_SITE_OTHER): Payer: Medicare Other

## 2021-03-09 ENCOUNTER — Ambulatory Visit (INDEPENDENT_AMBULATORY_CARE_PROVIDER_SITE_OTHER): Payer: Medicare Other | Admitting: Podiatry

## 2021-03-09 DIAGNOSIS — M2041 Other hammer toe(s) (acquired), right foot: Secondary | ICD-10-CM

## 2021-03-09 DIAGNOSIS — L97415 Non-pressure chronic ulcer of right heel and midfoot with muscle involvement without evidence of necrosis: Secondary | ICD-10-CM | POA: Diagnosis not present

## 2021-03-09 DIAGNOSIS — E11621 Type 2 diabetes mellitus with foot ulcer: Secondary | ICD-10-CM | POA: Diagnosis not present

## 2021-03-09 NOTE — Progress Notes (Signed)
  Subjective:  Patient ID: Katherine Herrera, female    DOB: 1966/02/09,  MRN: [id number]  Chief Complaint  Patient presents with  . Wound Check    Denies any concerns/f/c/n/v. Minimal blood drainage.   . Nail Problem    Reports concerns w/ 2 rt hammertoe nail.    55 y.o. female presents for wound care. Hx confirmed with patient. Recently traveled to [place]. States her right foot is hurting more on the top. Objective:  Physical Exam: Wound Location: right 5th met base Wound Measurement: 2*1.8 Wound Base: granular Peri-wound: hyperkeratotic Exudate: Scant/small amount Serosanguinous exudate wound without warmth, erythema, signs of acute infection  XR Right foot:  Assessment:   1. Diabetic ulcer of right midfoot associated with type 2 diabetes mellitus, with muscle involvement without evidence of necrosis ([place])   2. Hammer toe of right foot    Plan:  Patient was evaluated and treated and all questions answered.  Ulcer Right foot -Wound cleansed and debrided. Nushield Graft applied ID# [id number], exp 10/07/25. Followed by ABD, kerlix, ACE bandage.   Cavus foot deformity, hammertoes -Worsened 2nd toe hammertoe today, appears acute. -XR today shows worsening 2nd toe hammertoe without osseous injury -Dispense hammertoe crest pad  No follow-ups on file.

## 2021-03-10 DIAGNOSIS — E1165 Type 2 diabetes mellitus with hyperglycemia: Secondary | ICD-10-CM | POA: Diagnosis not present

## 2021-03-10 DIAGNOSIS — E039 Hypothyroidism, unspecified: Secondary | ICD-10-CM | POA: Diagnosis not present

## 2021-03-10 DIAGNOSIS — E7800 Pure hypercholesterolemia, unspecified: Secondary | ICD-10-CM | POA: Diagnosis not present

## 2021-03-13 IMAGING — DX DG FOOT 2V*R*
2 series · 2 of 2 positions shown · non-contrast
Comparison: 06/24/2019

CLINICAL DATA: Status post right fifth metatarsal resection,
peroneal tenodesis, ulcer repair

EXAM:
RIGHT FOOT - 2 VIEW

[foot]
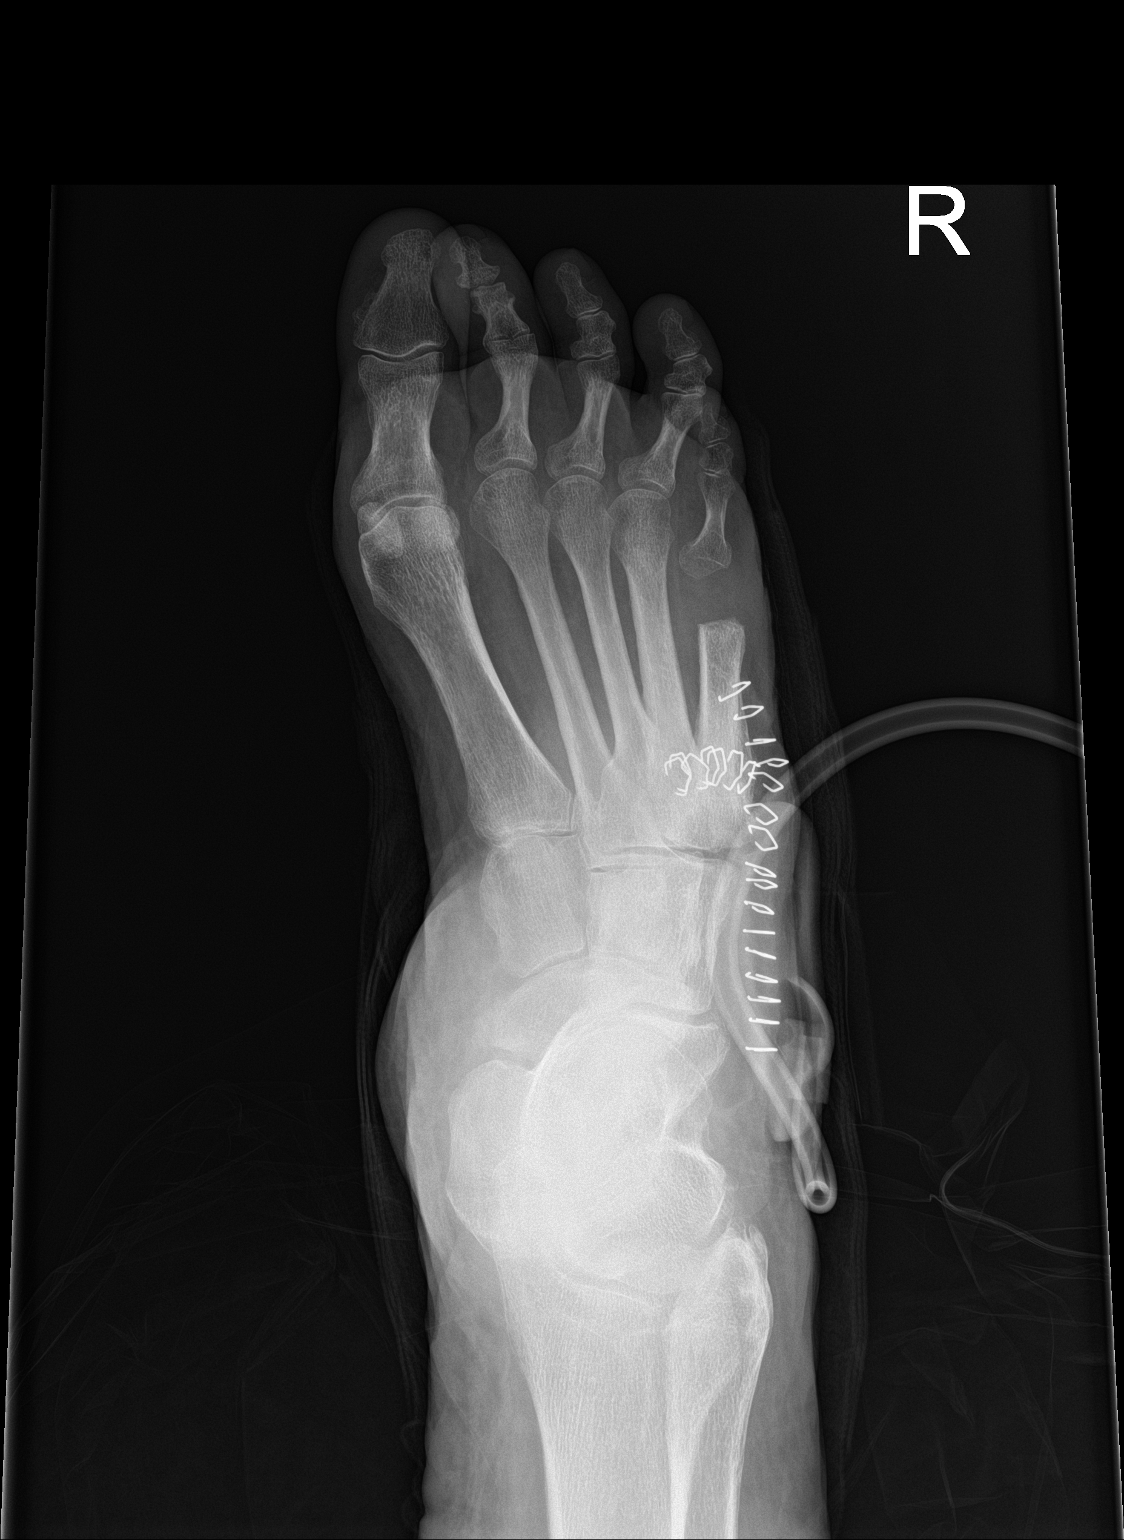

[leg]
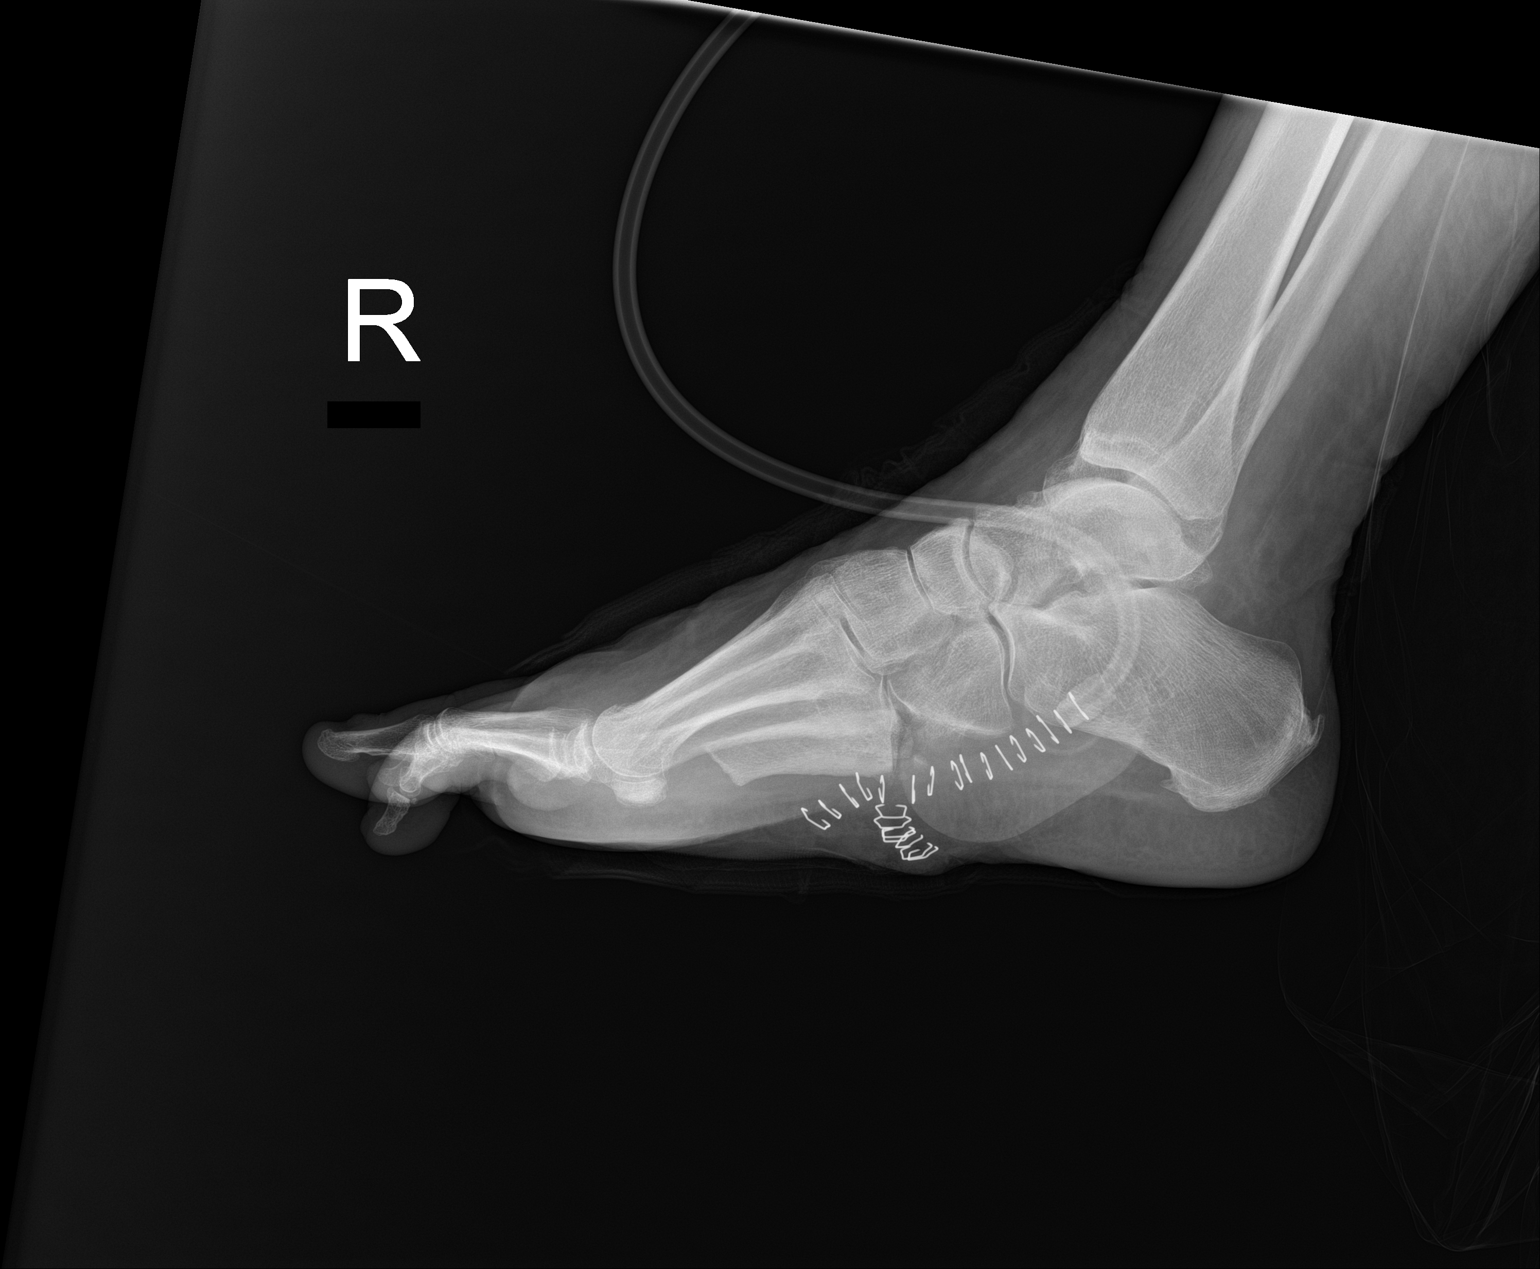

[2 of 2 positions shown; findings below may reference images not displayed]

FINDINGS: Redemonstrated postoperative findings of distal right fifth
metatarsal amputation with new overlying skin staples about the base
of the right fifth metatarsal. There is soft tissue edema about the
midfoot and ankle. Mild right first metatarsophalangeal arthrosis.
IMPRESSION: Redemonstrated postoperative findings of distal right fifth
metatarsal amputation with new overlying skin staples about the base
of the right fifth metatarsal. There is soft tissue edema about the
midfoot and ankle.

## 2021-03-16 ENCOUNTER — Other Ambulatory Visit: Payer: Self-pay

## 2021-03-16 ENCOUNTER — Ambulatory Visit (INDEPENDENT_AMBULATORY_CARE_PROVIDER_SITE_OTHER): Payer: Medicare Other | Admitting: Podiatry

## 2021-03-16 DIAGNOSIS — L97415 Non-pressure chronic ulcer of right heel and midfoot with muscle involvement without evidence of necrosis: Secondary | ICD-10-CM | POA: Diagnosis not present

## 2021-03-16 DIAGNOSIS — E11621 Type 2 diabetes mellitus with foot ulcer: Secondary | ICD-10-CM | POA: Diagnosis not present

## 2021-03-16 NOTE — Progress Notes (Signed)
  Subjective:  Patient ID: Katherine Herrera, female    DOB: 07/05/1966,  MRN: [id number]  Chief Complaint  Patient presents with  . Wound Check    Recheck diabetic ulcer of right foot. Pt state she has been doing well.   55 y.o. female presents for wound care. Hx confirmed with patient.   Objective:  Physical Exam: Wound Location: right 5th met base Wound Measurement: 1.5x1.5 Wound Base: granular Peri-wound: hyperkeratotic Exudate: Scant/small amount Serosanguinous exudate wound without warmth, erythema, signs of acute infection  Assessment:   1. Diabetic ulcer of right midfoot associated with type 2 diabetes mellitus, with muscle involvement without evidence of necrosis [facility])    Plan:  Patient was evaluated and treated and all questions answered.  Ulcer Right foot -Improved today. -Wound cleansed. NuShield graft ID #[id number] exp 10/09/25. Covered with Mepitel, 4x4, ABD, kerlix, ACE bandage. -Continue surgical shoe  Cavus foot deformity, hammertoes -Improved  No follow-ups on file.

## 2021-03-22 ENCOUNTER — Telehealth: Payer: Self-pay | Admitting: Podiatry

## 2021-03-22 NOTE — Telephone Encounter (Signed)
Patient called and stated she is unable to make her appointment tomorrow and asked was it ok for her daughter to remove the skin graft. Please advise

## 2021-03-22 NOTE — Telephone Encounter (Signed)
Yes she can just take the dressing off and shower normally and put a new dressing on and we can reschedule her appt

## 2021-03-23 ENCOUNTER — Ambulatory Visit: Payer: Medicare Other | Admitting: Podiatry

## 2021-03-24 ENCOUNTER — Telehealth: Payer: Self-pay | Admitting: Internal Medicine

## 2021-03-24 MED ORDER — OXYCODONE HCL 15 MG PO TABS: 15.0000 mg | ORAL_TABLET | Freq: Four times a day (QID) | ORAL | 0 refills | Status: DC | PRN

## 2021-03-24 NOTE — Telephone Encounter (Signed)
1.Medication Requested: oxyCODONE (ROXICODONE) 15 MG immediate release tablet    2. Pharmacy (Name, Street, [place]): [id number] - [place], [place]  3. On Med List: yes   4. Last Visit with PCP: 01-26-21  5. Next visit date with PCP:04-25-21   Agent: Please be advised that RX refills may take up to 3 business days. We ask that you follow-up with your pharmacy.

## 2021-03-24 NOTE — Telephone Encounter (Signed)
Pt has ap-pt scheduled for 04/28/21.Marland KitchenJohny Herrera

## 2021-03-24 NOTE — Telephone Encounter (Signed)
Check [place] registry last filled 01/26/2021.Marland KitchenJohny Chess

## 2021-03-24 NOTE — Telephone Encounter (Signed)
Okay.  Done.  Office visit every 3 months

## 2021-03-30 ENCOUNTER — Ambulatory Visit (INDEPENDENT_AMBULATORY_CARE_PROVIDER_SITE_OTHER): Payer: Medicare Other | Admitting: Podiatry

## 2021-03-30 ENCOUNTER — Other Ambulatory Visit: Payer: Self-pay

## 2021-03-30 DIAGNOSIS — E11621 Type 2 diabetes mellitus with foot ulcer: Secondary | ICD-10-CM | POA: Diagnosis not present

## 2021-03-30 DIAGNOSIS — L97415 Non-pressure chronic ulcer of right heel and midfoot with muscle involvement without evidence of necrosis: Secondary | ICD-10-CM | POA: Diagnosis not present

## 2021-03-30 NOTE — Progress Notes (Signed)
  Subjective:  Patient ID: Katherine Herrera, female    DOB: 1965/12/25,  MRN: [id number]  Chief Complaint  Patient presents with  . Wound Check    1 week f/u wound check    55 y.o. female presents for wound care. Hx confirmed with patient.   Objective:  Physical Exam: Wound Location: right 5th met base Wound Measurement: 1.5x1.5 Wound Base: granular Peri-wound: hyperkeratotic Exudate: Scant/small amount Serosanguinous exudate wound without warmth, erythema, signs of acute infection  Assessment:   1. Diabetic ulcer of right midfoot associated with type 2 diabetes mellitus, with muscle involvement without evidence of necrosis [facility])    Plan:  Patient was evaluated and treated and all questions answered.  Ulcer Right foot  -Continues to improve -Nu-Shield ID #[id number] exp 10/14/25 graft applied. Covered with mepitel and DSD. Leave intact x1 week then ok to change dressing.  Cavus foot deformity, hammertoes -Improved  Return in about 10 days (around 04/09/2021).

## 2021-04-09 ENCOUNTER — Other Ambulatory Visit: Payer: Self-pay

## 2021-04-09 ENCOUNTER — Encounter: Payer: Self-pay | Admitting: Pharmacist

## 2021-04-09 ENCOUNTER — Ambulatory Visit (INDEPENDENT_AMBULATORY_CARE_PROVIDER_SITE_OTHER): Payer: Medicare Other | Admitting: Podiatry

## 2021-04-09 DIAGNOSIS — E11621 Type 2 diabetes mellitus with foot ulcer: Secondary | ICD-10-CM

## 2021-04-09 DIAGNOSIS — L97415 Non-pressure chronic ulcer of right heel and midfoot with muscle involvement without evidence of necrosis: Secondary | ICD-10-CM | POA: Diagnosis not present

## 2021-04-09 DIAGNOSIS — G720 Drug-induced myopathy: Secondary | ICD-10-CM

## 2021-04-09 NOTE — Progress Notes (Signed)
  Subjective:  Patient ID: Katherine Herrera, female    DOB: 1966-03-02,  MRN: [id number]  Chief Complaint  Patient presents with   Wound Check    Denies fever/chills/nausea/vomiting. No new concerns.    55 y.o. female presents for wound care. Hx confirmed with patient.   Objective:  Physical Exam: Wound Location: right 5th met base Wound Measurement: 1.5x2 Wound Base: granular Peri-wound: hyperkeratotic Exudate: Scant/small amount Serosanguinous exudate wound without warmth, erythema, signs of acute infection  Assessment:   1. Diabetic ulcer of right midfoot associated with type 2 diabetes mellitus, with muscle involvement without evidence of necrosis [facility])     Plan:  Patient was evaluated and treated and all questions answered.  Ulcer Right foot -Wound similar to last visit -Debrided wound. Applied NuShield ID [id number] exp 09/30/25. Covered with mepitel, 4x4, kerlix, ACE  Cavus foot deformity, hammertoes -Will consider surgical intervention planning for August.  Return in about 1 week (around 04/16/2021) for Wound Care.

## 2021-04-09 NOTE — Progress Notes (Signed)
[facility])                                            [place] Team                                        Statin Quality Measure Assessment    04/09/2021  CIERRIA HEIGHT February 16, 1966 [id number]  Per review of chart and payor information, this patient has been flagged for non-adherence to the following CMS Quality Measure:   [x]  Statin Use in Persons with Diabetes (SUPD)  []  Statin Use in Persons with Cardiovascular Disease ([place])  The ASCVD Risk score Mikey Bussing DC Jr., et al., 2013) failed to calculate for the following reasons:   Cannot find a previous HDL lab   Cannot find a previous total cholesterol lab Last lipid panel 2017 in [facility] records.    Currently prescribed statin:  []  Yes [x]  No     Comments: Allergies listed to lovastatin and rosuvastatin due to myalgias   Please consider ONE of the following recommendations:   Initiate moderate intensity          statin with reduced frequency if prior          statin intolerance 1x weekly, #13, 3 refills   2x weekly, #26, 3 refills   3x weekly, #39, 3 refills   OR    Code for past statin intolerance or other exclusions (required annually)  Drug Induced Myopathy G72.0    Thank you for your time, Ralene Bathe, PharmD, [place] [phone]

## 2021-04-16 ENCOUNTER — Ambulatory Visit: Payer: Medicare Other | Admitting: Podiatry

## 2021-04-24 ENCOUNTER — Ambulatory Visit: Payer: Medicare Other | Admitting: Podiatry

## 2021-04-28 ENCOUNTER — Encounter: Payer: Self-pay | Admitting: Internal Medicine

## 2021-04-28 ENCOUNTER — Other Ambulatory Visit: Payer: Self-pay

## 2021-04-28 ENCOUNTER — Ambulatory Visit (INDEPENDENT_AMBULATORY_CARE_PROVIDER_SITE_OTHER): Payer: Medicare Other | Admitting: Internal Medicine

## 2021-04-28 DIAGNOSIS — M5440 Lumbago with sciatica, unspecified side: Secondary | ICD-10-CM | POA: Diagnosis not present

## 2021-04-28 DIAGNOSIS — G894 Chronic pain syndrome: Secondary | ICD-10-CM

## 2021-04-28 DIAGNOSIS — F17200 Nicotine dependence, unspecified, uncomplicated: Secondary | ICD-10-CM

## 2021-04-28 DIAGNOSIS — E538 Deficiency of other specified B group vitamins: Secondary | ICD-10-CM | POA: Diagnosis not present

## 2021-04-28 MED ORDER — OXYCODONE HCL 15 MG PO TABS: 15.0000 mg | ORAL_TABLET | Freq: Four times a day (QID) | ORAL | 0 refills | Status: DC | PRN

## 2021-04-28 MED ORDER — NICOTINE 21 MG/24HR TD PT24: 21.0000 mg | MEDICATED_PATCH | Freq: Every day | TRANSDERMAL | 0 refills | Status: DC

## 2021-04-28 NOTE — Assessment & Plan Note (Signed)
On B12 

## 2021-04-28 NOTE — Assessment & Plan Note (Signed)
On OXY UDS ordered  Potential benefits of a long term opioids use as well as potential risks (i.e. addiction risk, apnea etc) and complications (i.e. Somnolence, constipation and others) were explained to the patient and were aknowledged.

## 2021-04-28 NOTE — Assessment & Plan Note (Addendum)
Now at 1 ppd - try Nicoderm patches

## 2021-04-28 NOTE — Progress Notes (Signed)
Subjective:  Patient ID: Katherine Herrera, female    DOB: 1966/02/21  Age: 55 y.o. MRN: [id number]  CC: Follow-up (3 month f/u)   HPI Katherine Herrera presents for DM, HTN, LBP, diabetic neuropathy. Labs w/Endo  Outpatient Medications Prior to Visit  Medication Sig Dispense Refill   aspirin (BAYER ASPIRIN) 325 MG tablet Take 1 tablet (325 mg total) by mouth daily. 100 tablet 3   B-D INS SYR ULTRAFINE 1CC/31G 31G X 5/16" 1 ML MISC USE AS DIRECTED 100 each 0   baclofen (LIORESAL) 10 MG tablet TAKE 2 TABLETS BY MOUTH  TWICE DAILY 180 tablet 1   bisacodyl (DULCOLAX) 5 MG EC tablet Take 10 mg by mouth at bedtime.     Blood Glucose Monitoring Suppl (ONETOUCH VERIO) w/Device KIT      clonazePAM (KLONOPIN) 0.5 MG tablet Take 1 tablet (0.5 mg total) by mouth 2 (two) times daily as needed for anxiety. 60 tablet 1   ezetimibe (ZETIA) 10 MG tablet TAKE 1 TABLET BY MOUTH  DAILY 90 tablet 2   fluticasone (FLONASE) 50 MCG/ACT nasal spray Place 2 sprays into both nostrils daily. (Patient taking differently: Place 2 sprays into both nostrils daily as needed.) 16 g 6   gabapentin (NEURONTIN) 800 MG tablet TAKE 1 TABLET BY MOUTH 4  TIMES DAILY 360 tablet 3   glucose blood (ONETOUCH VERIO) test strip Use to check blood sugar 2 times per day. 100 each 12   insulin NPH-regular Human (70-30) 100 UNIT/ML injection Inject 10 Units into the skin 2 (two) times daily with a meal. (Patient taking differently: Inject into the skin as directed. 05-26-2020  Per pt 50 units an am and evening and at lunch time if cbg >200 it's 10 units) 10 mL 11   Insulin Syringe-Needle U-100 (SURE COMFORT INSULIN SYRINGE) 31G X 5/16" 0.5 ML MISC USE AS DIRECTED WITH INSULIN 4 TIMES A DAY 400 each 2   lamoTRIgine (LAMICTAL) 25 MG tablet TAKE 1 TABLET BY MOUTH  TWICE DAILY 180 tablet 3   Lancet Devices (ACCU-CHEK SOFTCLIX) lancets Test two times daily 100 each 12   Lancets (ONETOUCH DELICA PLUS [id number]) MISC      levothyroxine (SYNTHROID)  75 MCG tablet TAKE 1 TABLET BY MOUTH  DAILY 90 tablet 3   lisinopril (ZESTRIL) 10 MG tablet TAKE 1 TABLET BY MOUTH  DAILY 90 tablet 2   loratadine (CLARITIN) 10 MG tablet Take 1 tablet (10 mg total) by mouth daily as needed for allergies. 100 tablet 3   ondansetron (ZOFRAN) 4 MG tablet 1 tablet     oxyCODONE (ROXICODONE) 15 MG immediate release tablet Take 1 tablet (15 mg total) by mouth every 6 (six) hours as needed. 120 tablet 0   rOPINIRole (REQUIP) 0.5 MG tablet TAKE 1 TABLET BY MOUTH AT  BEDTIME 90 tablet 3   senna-docusate (SENOKOT-S) 8.6-50 MG tablet Take 1 tablet by mouth 2 (two) times daily. (Patient taking differently: Take 1 tablet by mouth at bedtime.) 20 tablet 0   traMADol (ULTRAM) 50 MG tablet 1 tablet as needed     vitamin B-12 (CYANOCOBALAMIN) 1000 MCG tablet Take 1,000 mcg by mouth daily.     zolpidem (AMBIEN) 10 MG tablet 1 tablet at bedtime as needed     clindamycin (CLEOCIN) 300 MG capsule Take 1 capsule (300 mg total) by mouth 2 (two) times daily. (Patient not taking: Reported on 04/28/2021) 14 capsule 0   doxycycline (VIBRA-TABS) 100 MG tablet Take 1 tablet (100  mg total) by mouth 2 (two) times daily. (Patient not taking: Reported on 04/28/2021) 20 tablet 0   silver sulfADIAZINE (SILVADENE) 1 % cream Apply pea-sized amount to wound daily. (Patient not taking: Reported on 04/28/2021) 50 g 0   No facility-administered medications prior to visit.    ROS: Review of Systems  Constitutional:  Positive for fatigue. Negative for activity change, appetite change, chills and unexpected weight change.  HENT:  Negative for congestion, mouth sores and sinus pressure.   Eyes:  Negative for visual disturbance.  Respiratory:  Negative for cough and chest tightness.   Gastrointestinal:  Negative for abdominal pain and nausea.  Genitourinary:  Negative for difficulty urinating, frequency and vaginal pain.  Musculoskeletal:  Positive for arthralgias, back pain and gait problem.  Skin:   Positive for wound. Negative for pallor and rash.  Neurological:  Negative for dizziness, tremors, weakness, numbness and headaches.  Psychiatric/Behavioral:  Negative for confusion, sleep disturbance and suicidal ideas.    Objective:  BP 110/68 (BP Location: Left Arm)   Pulse 83   Temp 98.3 F (36.8 C) (Oral)   Ht 5' 9.5" (1.765 m)   Wt 218 lb 3.2 oz (99 kg)   SpO2 95%   BMI 31.76 kg/m   BP Readings from Last 3 Encounters:  04/28/21 110/68  01/28/21 124/88  01/26/21 110/68    Wt Readings from Last 3 Encounters:  04/28/21 218 lb 3.2 oz (99 kg)  01/27/21 220 lb 7.4 oz (100 kg)  01/26/21 219 lb 9.6 oz (99.6 kg)    Physical Exam Constitutional:      General: She is not in acute distress.    Appearance: She is well-developed. She is obese.  HENT:     Head: Normocephalic.     Right Ear: External ear normal.     Left Ear: External ear normal.     Nose: Nose normal.  Eyes:     General:        Right eye: No discharge.        Left eye: No discharge.     Conjunctiva/sclera: Conjunctivae normal.     Pupils: Pupils are equal, round, and reactive to light.  Neck:     Thyroid: No thyromegaly.     Vascular: No JVD.     Trachea: No tracheal deviation.  Cardiovascular:     Rate and Rhythm: Normal rate and regular rhythm.     Heart sounds: Normal heart sounds.  Pulmonary:     Effort: No respiratory distress.     Breath sounds: No stridor. No wheezing.  Abdominal:     General: Bowel sounds are normal. There is no distension.     Palpations: Abdomen is soft. There is no mass.     Tenderness: There is no abdominal tenderness. There is no guarding or rebound.  Musculoskeletal:        General: Tenderness present.     Cervical back: Normal range of motion and neck supple. No rigidity.  Lymphadenopathy:     Cervical: No cervical adenopathy.  Skin:    Findings: No erythema or rash.  Neurological:     Mental Status: She is oriented to person, place, and time.     Cranial Nerves:  No cranial nerve deficit.     Motor: No abnormal muscle tone.     Coordination: Coordination normal.     Gait: Gait abnormal.     Deep Tendon Reflexes: Reflexes normal.  Psychiatric:        Behavior:  Behavior normal.        Thought Content: Thought content normal.        Judgment: Judgment normal.   Using surg shoes B R foot wound is dressed LS w/pain  Lab Results  Component Value Date   WBC 11.1 (H) 01/27/2021   HGB 13.0 01/27/2021   HCT 41.1 01/27/2021   PLT 327 01/27/2021   GLUCOSE 148 (H) 01/27/2021   CHOL 223 (H) 07/19/2016   TRIG (H) 07/19/2016    527.0 Triglyceride is over 400; calculations on Lipids are invalid.   HDL 27.40 (L) 07/19/2016   LDLDIRECT 106.0 07/19/2016   LDLCALC 81 01/02/2013   ALT 16 01/27/2021   AST 16 01/27/2021   NA 136 01/27/2021   K 3.9 01/27/2021   CL 101 01/27/2021   CREATININE 1.17 (H) 01/27/2021   BUN 27 (H) 01/27/2021   CO2 26 01/27/2021   TSH 1.23 10/21/2020   INR 1.0 07/19/2019   HGBA1C 9.5 (H) 10/21/2020   MICROALBUR 0.9 07/19/2016    VAS [place] LOWER EXTREMITY VENOUS (DVT)  Result Date: 02/24/2021  Lower Venous DVT Study Patient Name:  Katherine Herrera [facility]  Date of Exam:   02/24/2021 Medical Rec #: [id number]        Accession #:    [id number] Date of Birth: [birth year]        Patient Gender: F Patient Age:   55Y Exam Location:  [facility] Procedure:      VAS [place] LOWER EXTREMITY VENOUS (DVT) Referring Phys: [id number] [place] --------------------------------------------------------------------------------  Indications: Edema with ulcer.  Comparison Study: Negative right LE DVT exams:                   10/28/2020                   01/13/2020                   07/06/2019                    11/10/2016 Performing Technologist: Ronal Fear RVS, RCS  Examination Guidelines: A complete evaluation includes B-mode imaging, spectral Doppler, color Doppler, and power Doppler as needed of all accessible portions of each vessel. Bilateral  testing is considered an integral part of a complete examination. Limited examinations for reoccurring indications may be performed as noted. The reflux portion of the exam is performed with the patient in reverse Trendelenburg.  +---------+---------------+---------+-----------+----------+-------------------+ RIGHT    CompressibilityPhasicitySpontaneityPropertiesThrombus Aging      +---------+---------------+---------+-----------+----------+-------------------+ CFV      Full                                                             +---------+---------------+---------+-----------+----------+-------------------+ SFJ      Full                                                             +---------+---------------+---------+-----------+----------+-------------------+ FV Prox  Full                                                             +---------+---------------+---------+-----------+----------+-------------------+  FV Mid   Full                                                             +---------+---------------+---------+-----------+----------+-------------------+ FV DistalFull                                                             +---------+---------------+---------+-----------+----------+-------------------+ POP      Full                                                             +---------+---------------+---------+-----------+----------+-------------------+ PTV                                                   Not well visualized +---------+---------------+---------+-----------+----------+-------------------+ PERO                                                  Not well visualized +---------+---------------+---------+-----------+----------+-------------------+   Right Technical Findings: Not visualized segments include posterior tibial and peroneals. Due to patients history of several negative right DVT exams for edema, a spot check of  the deep and superficial veins for competency was performed. A formal reflux exam may be warranted.   Findings reported to nurse triage line at 2:30 pm.  Summary: RIGHT: - There is no evidence of deep vein thrombosis in the lower extremity. - There is no evidence of superficial venous thrombosis. - Deep and superficial vein insufficiency noted. - No cystic structure found in the popliteal fossa.   *See table(s) above for measurements and observations. Electronically signed by Deitra Mayo MD on 02/24/2021 at 3:45:54 PM.    Final     Assessment & Plan:     Walker Kehr, MD

## 2021-04-28 NOTE — Assessment & Plan Note (Signed)
On Oxycodone 15 mg  Potential benefits of a long term opioids use as well as potential risks (i.e. addiction risk, apnea etc) and complications (i.e. Somnolence, constipation and others) were explained to the patient and were aknowledged. Risks of use w/benzodiazepines discussed  Do not take w/Clonazepam

## 2021-04-28 NOTE — Assessment & Plan Note (Signed)
F/u w/Dr March Rummage

## 2021-04-30 ENCOUNTER — Ambulatory Visit (INDEPENDENT_AMBULATORY_CARE_PROVIDER_SITE_OTHER): Payer: Medicare Other | Admitting: Podiatry

## 2021-04-30 ENCOUNTER — Other Ambulatory Visit: Payer: Self-pay

## 2021-04-30 ENCOUNTER — Ambulatory Visit: Payer: Self-pay | Admitting: Podiatry

## 2021-04-30 DIAGNOSIS — M2041 Other hammer toe(s) (acquired), right foot: Secondary | ICD-10-CM | POA: Diagnosis not present

## 2021-04-30 DIAGNOSIS — Q6671 Congenital pes cavus, right foot: Secondary | ICD-10-CM | POA: Diagnosis not present

## 2021-04-30 DIAGNOSIS — L97415 Non-pressure chronic ulcer of right heel and midfoot with muscle involvement without evidence of necrosis: Secondary | ICD-10-CM | POA: Diagnosis not present

## 2021-04-30 DIAGNOSIS — E11621 Type 2 diabetes mellitus with foot ulcer: Secondary | ICD-10-CM

## 2021-04-30 DIAGNOSIS — M21961 Unspecified acquired deformity of right lower leg: Secondary | ICD-10-CM | POA: Diagnosis not present

## 2021-04-30 NOTE — H&P (View-Only) (Signed)
  Subjective:  Patient ID: Katherine Herrera, female    DOB: 02-15-1966,  MRN: [id number]  Chief Complaint  Patient presents with   Wound Check    Denies f/c/n/v. Manageable pain lvls during the day, more unbearable at night. Denies any other concerns.    55 y.o. female presents for wound care. Hx confirmed with patient.   Objective:  Physical Exam: Wound Location: right 5th met base Wound Measurement: 1.5x2 Wound Base: granular Peri-wound: hyperkeratotic Exudate: Scant/small amount Serosanguinous exudate wound without warmth, erythema, signs of acute infection  Assessment:   1. Diabetic ulcer of right midfoot associated with type 2 diabetes mellitus, with muscle involvement without evidence of necrosis ([place])   2. Hammer toe of right foot   3. Cavus deformity of right foot   4. Deformity of metatarsal bone of right foot      Plan:  Patient was evaluated and treated and all questions answered.  Ulcer Right foot -Wound similar in size. Minimally debrided today. Dressed with prisma and DSD. No graft application today.  Cavus foot deformity, hammertoes -At this point her 2nd toe is worsening and surgical intervention is indicated. Discussed attempting to reduce pressure at her ulceration with reduction of the remaining bone rather than complex reconstruction -Patient has failed all conservative therapy and wishes to proceed with surgical intervention. All risks, benefits, and alternatives discussed with patient. No guarantees given. Consent reviewed and signed by patient. -Planned procedures: right foot 2nd toe hammertoe correction with flexor tenotomy or possible arthroplasty; right 5th metatarsal resection, wound debridement and possible skin graft substitute application -ASA 3 - Patient with moderate systemic disease with functional limitations    No follow-ups on file.

## 2021-04-30 NOTE — Progress Notes (Signed)
  Subjective:  Patient ID: Katherine Herrera, female    DOB: 01/28/1966,  MRN: [id number]  Chief Complaint  Patient presents with   Wound Check    Denies f/c/n/v. Manageable pain lvls during the day, more unbearable at night. Denies any other concerns.    55 y.o. female presents for wound care. Hx confirmed with patient.   Objective:  Physical Exam: Wound Location: right 5th met base Wound Measurement: 1.5x2 Wound Base: granular Peri-wound: hyperkeratotic Exudate: Scant/small amount Serosanguinous exudate wound without warmth, erythema, signs of acute infection  Assessment:   1. Diabetic ulcer of right midfoot associated with type 2 diabetes mellitus, with muscle involvement without evidence of necrosis ([facility])   2. Hammer toe of right foot   3. Cavus deformity of right foot   4. Deformity of metatarsal bone of right foot      Plan:  Patient was evaluated and treated and all questions answered.  Ulcer Right foot -Wound similar in size. Minimally debrided today. Dressed with prisma and DSD. No graft application today.  Cavus foot deformity, hammertoes -At this point her 2nd toe is worsening and surgical intervention is indicated. Discussed attempting to reduce pressure at her ulceration with reduction of the remaining bone rather than complex reconstruction -Patient has failed all conservative therapy and wishes to proceed with surgical intervention. All risks, benefits, and alternatives discussed with patient. No guarantees given. Consent reviewed and signed by patient. -Planned procedures: right foot 2nd toe hammertoe correction with flexor tenotomy or possible arthroplasty; right 5th metatarsal resection, wound debridement and possible skin graft substitute application -ASA 3 - Patient with moderate systemic disease with functional limitations    No follow-ups on file.  

## 2021-05-05 ENCOUNTER — Telehealth: Payer: Self-pay | Admitting: Urology

## 2021-05-05 NOTE — Telephone Encounter (Addendum)
DOS - 05/12/21  WOUND DEBRIDE - 46962 HAMMER TOE REPAIR 2ND RIGHT - [place] EFFECTIVE DATE - 10/31/20   PLAN DEDUCTIBLE - $0.00  OUT OF POCKET - $4,500.00 W/ $4,500.00 REMAINING COINSURANCE - 0% COPAY - $325.00   PER [employer] FOR CPT CODES 95284, [id number], [id number] AND  13244 Notification or Prior Authorization is not required for the requested services. CPT CODE 01027 HAS BEEN APPROVED, AUTH # [id number].  Decision ID #:[id number]

## 2021-05-07 ENCOUNTER — Encounter (HOSPITAL_COMMUNITY)
Admission: RE | Admit: 2021-05-07 | Discharge: 2021-05-07 | Disposition: A | Discharge status: Home / Self Care | Payer: Medicare Other | Source: Ambulatory Visit | Attending: Podiatry | Admitting: Podiatry

## 2021-05-07 ENCOUNTER — Other Ambulatory Visit: Payer: Self-pay

## 2021-05-07 ENCOUNTER — Encounter (HOSPITAL_COMMUNITY): Payer: Self-pay

## 2021-05-07 DIAGNOSIS — Z01812 Encounter for preprocedural laboratory examination: Secondary | ICD-10-CM | POA: Diagnosis not present

## 2021-05-07 LAB — CBC
HCT: 44.2 % (ref 36.0–46.0)
Hemoglobin: 14 g/dL (ref 12.0–15.0)
MCH: 27.2 pg (ref 26.0–34.0)
MCHC: 31.7 g/dL (ref 30.0–36.0)
MCV: 86 fL (ref 80.0–100.0)
Platelets: 300 10*3/uL (ref 150–400)
RBC: 5.14 MIL/uL — ABNORMAL HIGH (ref 3.87–5.11)
RDW: 14.9 % (ref 11.5–15.5)
WBC: 9.9 10*3/uL (ref 4.0–10.5)
nRBC: 0 % (ref 0.0–0.2)

## 2021-05-07 LAB — BASIC METABOLIC PANEL
Anion gap: 6 (ref 5–15)
BUN: 17 mg/dL (ref 6–20)
CO2: 27 mmol/L (ref 22–32)
Calcium: 8.9 mg/dL (ref 8.9–10.3)
Chloride: 105 mmol/L (ref 98–111)
Creatinine, Ser: 0.95 mg/dL (ref 0.44–1.00)
GFR, Estimated: >60 mL/min (ref >60)
Glucose, Bld: 121 mg/dL — ABNORMAL HIGH (ref 70–99)
Potassium: 4.4 mmol/L (ref 3.5–5.1)
Sodium: 138 mmol/L (ref 135–145)

## 2021-05-07 LAB — GLUCOSE, CAPILLARY: Glucose-Capillary: 160 mg/dL — ABNORMAL HIGH (ref 70–99)

## 2021-05-07 LAB — HEMOGLOBIN A1C
Hgb A1c MFr Bld: 8.2 % — ABNORMAL HIGH (ref 4.8–5.6)
Mean Plasma Glucose: 188.64 mg/dL

## 2021-05-07 NOTE — Patient Instructions (Addendum)
DUE TO COVID-19 ONLY ONE VISITOR IS ALLOWED TO COME WITH YOU AND STAY IN THE WAITING ROOM ONLY DURING PRE OP AND PROCEDURE.   **NO VISITORS ARE ALLOWED IN THE SHORT STAY AREA OR RECOVERY ROOM!!**       Your procedure is scheduled on: 05/12/21   Report to [facility] Main  Entrance    Report to admitting at 9:45 AM   Call this number if you have problems the morning of surgery [phone]   Do not eat food :After Midnight.   May have liquids until 8:45 AM day of surgery  CLEAR LIQUID DIET  Foods Allowed                                                                     Foods Excluded  Water, Black Coffee and tea, regular and decaf               liquids that you cannot  Plain Jell-O in any flavor  (No red)                                     see through such as: Fruit ices (not with fruit pulp)                                            milk, soups, orange juice              Iced Popsicles (No red)                                               All solid food                                   Apple juices Sports drinks like Gatorade (No red) Lightly seasoned clear broth or consume(fat free) Sugar, honey syrup     The day of surgery:  Drink ONE (1) Pre-Surgery G2 by am the morning of surgery. Drink in one sitting. Do not sip.  This drink was given to you during your hospital  pre-op appointment visit. Nothing else to drink after completing the  Pre-Surgery G2.          If you have questions, please contact your surgeon's office.     Oral Hygiene is also important to reduce your risk of infection.                                    Remember - BRUSH YOUR TEETH THE MORNING OF SURGERY WITH YOUR REGULAR TOOTHPASTE   Do NOT smoke after Midnight  Take these medicines the morning of surgery with A SIP OF WATER: Baclofen, Clonazepam, Ezetimibe, Gabapentin, Lamotrigine, Levothyroxine, Ondansetron, Oxycodone.   DO NOT TAKE ANY ORAL DIABETIC MEDICATIONS DAY OF YOUR  SURGERY  How to Manage Your Diabetes Before and After Surgery  Why is it important to control my blood sugar before and after surgery? Improving blood sugar levels before and after surgery helps healing and can limit problems. A way of improving blood sugar control is eating a healthy diet by:  Eating less sugar and carbohydrates  Increasing activity/exercise  Talking with your doctor about reaching your blood sugar goals High blood sugars (greater than 180 mg/dL) can raise your risk of infections and slow your recovery, so you will need to focus on controlling your diabetes during the weeks before surgery. Make sure that the doctor who takes care of your diabetes knows about your planned surgery including the date and location.  How do I manage my blood sugar before surgery? Check your blood sugar at least 4 times a day, starting 2 days before surgery, to make sure that the level is not too high or low. Check your blood sugar the morning of your surgery when you wake up and every 2 hours until you get to the Short Stay unit. If your blood sugar is less than 70 mg/dL, you will need to treat for low blood sugar: Do not take insulin. Treat a low blood sugar (less than 70 mg/dL) with  cup of clear juice (cranberry or apple), 4 glucose tablets, OR glucose gel. Recheck blood sugar in 15 minutes after treatment (to make sure it is greater than 70 mg/dL). If your blood sugar is not greater than 70 mg/dL on recheck, call [phone] for further instructions. Report your blood sugar to the short stay nurse when you get to Short Stay.  If you are admitted to the hospital after surgery: Your blood sugar will be checked by the staff and you will probably be given insulin after surgery (instead of oral diabetes medicines) to make sure you have good blood sugar levels. The goal for blood sugar control after surgery is 80-180 mg/dL.   WHAT DO I DO ABOUT MY DIABETES MEDICATION?  Do not take oral  diabetes medicines (pills) the morning of surgery.  THE DAY BEFORE SURGERY, take normal dose of NPH insulin in the morning and afternoon. Take 30 units of evening NPH dose.      THE MORNING OF SURGERY, take 25 units NPH insulin.     Reviewed and Endorsed by [facility] Patient Education Committee, August 2015                               You may not have any metal on your body including hair pins, jewelry, and body piercing             Do not wear make-up, lotions, powders, perfumes, or deodorant  Do not wear nail polish including gel and S&S, artificial/acrylic nails, or any other type of covering on natural nails including finger and toenails. If you have artificial nails, gel coating, etc. that needs to be removed by a nail salon please have this removed prior to surgery or surgery may need to be canceled/ delayed if the surgeon/ anesthesia feels like they are unable to be safely monitored.   Do not shave  48 hours prior to surgery.    Do not bring valuables to the hospital. [place].    Patients discharged the day of surgery will not be allowed  to drive home.  Special Instructions: Bring a copy of your healthcare power of attorney and living will documents    the day of surgery if you haven't scanned them in before.  Please read over the following fact sheets you were given: IF YOU HAVE QUESTIONS ABOUT YOUR PRE OP INSTRUCTIONS PLEASE CALL [phone]   [facility] - Preparing for Surgery Before surgery, you can play an important role.  Because skin is not sterile, your skin needs to be as free of germs as possible.  You can reduce the number of germs on your skin by washing with CHG (chlorahexidine gluconate) soap before surgery.  CHG is an antiseptic cleaner which kills germs and bonds with the skin to continue killing germs even after washing. Please DO NOT use if you have an allergy to CHG or antibacterial soaps.  If your skin  becomes reddened/irritated stop using the CHG and inform your nurse when you arrive at Short Stay. Do not shave (including legs and underarms) for at least 48 hours prior to the first CHG shower.  You may shave your face/neck.  Please follow these instructions carefully:  1.  Shower with CHG Soap the night before surgery and the  morning of surgery.  2.  If you choose to wash your hair, wash your hair first as usual with your normal  shampoo.  3.  After you shampoo, rinse your hair and body thoroughly to remove the shampoo.                             4.  Use CHG as you would any other liquid soap.  You can apply chg directly to the skin and wash.  Gently with a scrungie or clean washcloth.  5.  Apply the CHG Soap to your body ONLY FROM THE NECK DOWN.   Do   not use on face/ open                           Wound or open sores. Avoid contact with eyes, ears mouth and   genitals (private parts).                       Wash face,  Genitals (private parts) with your normal soap.             6.  Wash thoroughly, paying special attention to the area where your    surgery  will be performed.  7.  Thoroughly rinse your body with warm water from the neck down.  8.  DO NOT shower/wash with your normal soap after using and rinsing off the CHG Soap.                9.  Pat yourself dry with a clean towel.            10.  Wear clean pajamas.            11.  Place clean sheets on your bed the night of your first shower and do not  sleep with pets. Day of Surgery : Do not apply any lotions/deodorants the morning of surgery.  Please wear clean clothes to the hospital/surgery center.  FAILURE TO FOLLOW THESE INSTRUCTIONS MAY RESULT IN THE CANCELLATION OF YOUR SURGERY  PATIENT SIGNATURE_________________________________  NURSE SIGNATURE__________________________________  ________________________________________________________________________   Adam Phenix  An incentive spirometer is a tool that can  help keep your lungs clear and active. This tool measures how well you are filling your lungs with each breath. Taking long deep breaths may help reverse or decrease the chance of developing breathing (pulmonary) problems (especially infection) following: A long period of time when you are unable to move or be active. BEFORE THE PROCEDURE  If the spirometer includes an indicator to show your best effort, your nurse or respiratory therapist will set it to a desired goal. If possible, sit up straight or lean slightly forward. Try not to slouch. Hold the incentive spirometer in an upright position. INSTRUCTIONS FOR USE  Sit on the edge of your bed if possible, or sit up as far as you can in bed or on a chair. Hold the incentive spirometer in an upright position. Breathe out normally. Place the mouthpiece in your mouth and seal your lips tightly around it. Breathe in slowly and as deeply as possible, raising the piston or the ball toward the top of the column. Hold your breath for 3-5 seconds or for as long as possible. Allow the piston or ball to fall to the bottom of the column. Remove the mouthpiece from your mouth and breathe out normally. Rest for a few seconds and repeat Steps 1 through 7 at least 10 times every 1-2 hours when you are awake. Take your time and take a few normal breaths between deep breaths. The spirometer may include an indicator to show your best effort. Use the indicator as a goal to work toward during each repetition. After each set of 10 deep breaths, practice coughing to be sure your lungs are clear. If you have an incision (the cut made at the time of surgery), support your incision when coughing by placing a pillow or rolled up towels firmly against it. Once you are able to get out of bed, walk around indoors and cough well. You may stop using the incentive spirometer when instructed by your caregiver.  RISKS AND COMPLICATIONS Take your time so you do not get dizzy or  light-headed. If you are in pain, you may need to take or ask for pain medication before doing incentive spirometry. It is harder to take a deep breath if you are having pain. AFTER USE Rest and breathe slowly and easily. It can be helpful to keep track of a log of your progress. Your caregiver can provide you with a simple table to help with this. If you are using the spirometer at home, follow these instructions: [place] IF:  You are having difficultly using the spirometer. You have trouble using the spirometer as often as instructed. Your pain medication is not giving enough relief while using the spirometer. You develop fever of 100.5 F (38.1 C) or higher. SEEK IMMEDIATE MEDICAL CARE IF:  You cough up bloody sputum that had not been present before. You develop fever of 102 F (38.9 C) or greater. You develop worsening pain at or near the incision site. MAKE SURE YOU:  Understand these instructions. Will watch your condition. Will get help right away if you are not doing well or get worse. Document Released: 02/27/2007 Document Revised: 01/09/2012 Document Reviewed: 04/30/2007 [facility] Patient Information 2014 [place], [place].   ________________________________________________________________________

## 2021-05-07 NOTE — Progress Notes (Addendum)
COVID Vaccine Completed: Yes Date COVID Vaccine completed: 07/15/20, 09/28/20 Has received booster: no COVID vaccine manufacturer: Pfizer    Date of COVID positive in last 90 days: N/A  PCP - Lew Dawes, MD Cardiologist - N/A  H&P from PCP on chart  Chest x-ray - 12/31/19 Care Everywhere EKG - 01/28/21 Epic Stress Test - 10 years ago ECHO - 12/26/18 Epic Cardiac Cath - 4 years ago Pacemaker/ICD device last checked: N/A Spinal Cord Stimulator: N/A  Sleep Study - 9 years ago, negative sleep apnea per pt CPAP -   Fasting Blood Sugar - 180-205 Checks Blood Sugar _2 x per week_ times a day  Aspirin Instructions: ASA 81mg , stop 1 week before surgery Last Dose: 05/04/21  Activity level: Can go up a flight of stairs and perform activities of daily living without stopping and without symptoms of chest pain or shortness of breath.       Anesthesia review: tachycardia, DOE, CP evaluated, DM, HTN, COPD, sepsis 3/21 due to LLE cellulitis, seizures as child, syncope has been evaluated, A1C 8.2.  Patient denies shortness of breath, fever, cough and chest pain at PAT appointment   Patient verbalized understanding of instructions that were given to them at the PAT appointment. Patient was also instructed that they will need to review over the PAT instructions again at home before surgery.

## 2021-05-10 ENCOUNTER — Encounter (HOSPITAL_COMMUNITY): Admission: RE | Admit: 2021-05-10 | Payer: Medicare Other | Source: Ambulatory Visit

## 2021-05-10 NOTE — Progress Notes (Signed)
Routed A1C results to Dr. March Rummage.

## 2021-05-12 ENCOUNTER — Ambulatory Visit (HOSPITAL_COMMUNITY)
Admission: RE | Admit: 2021-05-12 | Discharge: 2021-05-12 | Disposition: A | Discharge status: Home / Self Care | Payer: Medicare Other | Attending: Podiatry | Admitting: Podiatry

## 2021-05-12 ENCOUNTER — Encounter (HOSPITAL_COMMUNITY): Payer: Self-pay | Admitting: Podiatry

## 2021-05-12 ENCOUNTER — Ambulatory Visit (HOSPITAL_COMMUNITY): Payer: Medicare Other | Admitting: Physician Assistant

## 2021-05-12 ENCOUNTER — Encounter (HOSPITAL_COMMUNITY)
Admission: RE | Disposition: A | Discharge status: Home / Self Care | Payer: Self-pay | Source: Home / Self Care | Attending: Podiatry

## 2021-05-12 ENCOUNTER — Ambulatory Visit (HOSPITAL_COMMUNITY): Payer: Medicare Other | Admitting: Certified Registered"

## 2021-05-12 DIAGNOSIS — M2041 Other hammer toe(s) (acquired), right foot: Secondary | ICD-10-CM

## 2021-05-12 DIAGNOSIS — E11621 Type 2 diabetes mellitus with foot ulcer: Secondary | ICD-10-CM

## 2021-05-12 DIAGNOSIS — L97415 Non-pressure chronic ulcer of right heel and midfoot with muscle involvement without evidence of necrosis: Secondary | ICD-10-CM

## 2021-05-12 DIAGNOSIS — D1631 Benign neoplasm of short bones of right lower limb: Secondary | ICD-10-CM | POA: Diagnosis not present

## 2021-05-12 DIAGNOSIS — L97511 Non-pressure chronic ulcer of other part of right foot limited to breakdown of skin: Secondary | ICD-10-CM | POA: Diagnosis not present

## 2021-05-12 DIAGNOSIS — E7800 Pure hypercholesterolemia, unspecified: Secondary | ICD-10-CM | POA: Diagnosis not present

## 2021-05-12 DIAGNOSIS — Q6671 Congenital pes cavus, right foot: Secondary | ICD-10-CM

## 2021-05-12 DIAGNOSIS — E10621 Type 1 diabetes mellitus with foot ulcer: Secondary | ICD-10-CM | POA: Diagnosis not present

## 2021-05-12 DIAGNOSIS — M21961 Unspecified acquired deformity of right lower leg: Secondary | ICD-10-CM

## 2021-05-12 HISTORY — PX: METATARSAL HEAD EXCISION: SHX5027

## 2021-05-12 LAB — GLUCOSE, CAPILLARY
Glucose-Capillary: 42 mg/dL — CL (ref 70–99)
Glucose-Capillary: 124 mg/dL — ABNORMAL HIGH (ref 70–99)
Glucose-Capillary: 93 mg/dL (ref 70–99)
Glucose-Capillary: 51 mg/dL — ABNORMAL LOW (ref 70–99)
Glucose-Capillary: 145 mg/dL — ABNORMAL HIGH (ref 70–99)
Glucose-Capillary: 156 mg/dL — ABNORMAL HIGH (ref 70–99)

## 2021-05-12 SURGERY — EXCISION, METATARSAL BONE, HEAD
Anesthesia: Monitor Anesthesia Care | Site: Toe | Laterality: Right

## 2021-05-12 MED ORDER — LIDOCAINE 2% (20 MG/ML) 5 ML SYRINGE
INTRAMUSCULAR | Status: DC | PRN
  Administered 2021-05-12 (×2): 20 mg via INTRAVENOUS

## 2021-05-12 MED ORDER — DEXAMETHASONE SODIUM PHOSPHATE 10 MG/ML IJ SOLN
INTRAMUSCULAR | Status: AC
  Filled 2021-05-12: qty 1

## 2021-05-12 MED ORDER — FENTANYL CITRATE (PF) 100 MCG/2ML IJ SOLN
INTRAMUSCULAR | Status: DC | PRN
  Administered 2021-05-12 (×2): 50 ug via INTRAVENOUS

## 2021-05-12 MED ORDER — FENTANYL CITRATE (PF) 100 MCG/2ML IJ SOLN
INTRAMUSCULAR | Status: AC
  Administered 2021-05-12: 25 ug via INTRAVENOUS
  Filled 2021-05-12: qty 2

## 2021-05-12 MED ORDER — OXYCODONE HCL 5 MG/5ML PO SOLN: 5.0000 mg | Freq: Once | ORAL | Status: DC | PRN

## 2021-05-12 MED ORDER — DEXTROSE 50 % IV SOLN: 1.0000 | INTRAVENOUS | Status: AC

## 2021-05-12 MED ORDER — VANCOMYCIN HCL 1000 MG IV SOLR
INTRAVENOUS | Status: AC
  Filled 2021-05-12: qty 1000

## 2021-05-12 MED ORDER — SUCCINYLCHOLINE CHLORIDE 200 MG/10ML IV SOSY
PREFILLED_SYRINGE | INTRAVENOUS | Status: AC
  Filled 2021-05-12: qty 10

## 2021-05-12 MED ORDER — PHENYLEPHRINE HCL (PRESSORS) 10 MG/ML IV SOLN
INTRAVENOUS | Status: AC
  Filled 2021-05-12: qty 1

## 2021-05-12 MED ORDER — 0.9 % SODIUM CHLORIDE (POUR BTL) OPTIME
TOPICAL | Status: DC | PRN
  Administered 2021-05-12: 1000 mL

## 2021-05-12 MED ORDER — FENTANYL CITRATE (PF) 250 MCG/5ML IJ SOLN
INTRAMUSCULAR | Status: AC
  Filled 2021-05-12: qty 5

## 2021-05-12 MED ORDER — LACTATED RINGERS IV SOLN: INTRAVENOUS | Status: DC

## 2021-05-12 MED ORDER — DEXTROSE 50 % IV SOLN
INTRAVENOUS | Status: AC
  Filled 2021-05-12: qty 50

## 2021-05-12 MED ORDER — PROPOFOL 500 MG/50ML IV EMUL
INTRAVENOUS | Status: AC
  Filled 2021-05-12: qty 200

## 2021-05-12 MED ORDER — PROPOFOL 10 MG/ML IV BOLUS
INTRAVENOUS | Status: AC
  Filled 2021-05-12: qty 20

## 2021-05-12 MED ORDER — DEXTROSE 50 % IV SOLN
INTRAVENOUS | Status: DC | PRN
  Administered 2021-05-12: 25 g via INTRAVENOUS

## 2021-05-12 MED ORDER — PROPOFOL 1000 MG/100ML IV EMUL
INTRAVENOUS | Status: AC
  Filled 2021-05-12: qty 100

## 2021-05-12 MED ORDER — LIDOCAINE 2% (20 MG/ML) 5 ML SYRINGE
INTRAMUSCULAR | Status: AC
  Filled 2021-05-12: qty 10

## 2021-05-12 MED ORDER — MIDAZOLAM HCL 2 MG/2ML IJ SOLN
INTRAMUSCULAR | Status: AC
  Filled 2021-05-12: qty 2

## 2021-05-12 MED ORDER — CLINDAMYCIN HCL 150 MG PO CAPS: 150.0000 mg | ORAL_CAPSULE | Freq: Two times a day (BID) | ORAL | 0 refills | Status: DC

## 2021-05-12 MED ORDER — FENTANYL CITRATE (PF) 100 MCG/2ML IJ SOLN
25.0000 ug | INTRAMUSCULAR | Status: DC | PRN
  Administered 2021-05-12 (×3): 25 ug via INTRAVENOUS

## 2021-05-12 MED ORDER — PROPOFOL 500 MG/50ML IV EMUL
INTRAVENOUS | Status: AC
  Filled 2021-05-12: qty 50

## 2021-05-12 MED ORDER — PHENYLEPHRINE HCL-NACL 10-0.9 MG/250ML-% IV SOLN
INTRAVENOUS | Status: DC | PRN
  Administered 2021-05-12: 25 ug/min via INTRAVENOUS

## 2021-05-12 MED ORDER — BUPIVACAINE HCL (PF) 0.5 % IJ SOLN
INTRAMUSCULAR | Status: DC | PRN
  Administered 2021-05-12: 20 mL

## 2021-05-12 MED ORDER — PROPOFOL 500 MG/50ML IV EMUL
INTRAVENOUS | Status: DC | PRN
  Administered 2021-05-12: 75 ug/kg/min via INTRAVENOUS

## 2021-05-12 MED ORDER — MIDAZOLAM HCL 5 MG/5ML IJ SOLN
INTRAMUSCULAR | Status: DC | PRN
  Administered 2021-05-12 (×2): 1 mg via INTRAVENOUS

## 2021-05-12 MED ORDER — DEXTROSE 50 % IV SOLN
INTRAVENOUS | Status: AC
  Administered 2021-05-12: 50 mL via INTRAVENOUS
  Filled 2021-05-12: qty 50

## 2021-05-12 MED ORDER — ONDANSETRON HCL 4 MG PO TABS: 4.0000 mg | ORAL_TABLET | Freq: Three times a day (TID) | ORAL | 0 refills | Status: DC | PRN

## 2021-05-12 MED ORDER — BUPIVACAINE HCL (PF) 0.5 % IJ SOLN
INTRAMUSCULAR | Status: AC
  Filled 2021-05-12: qty 30

## 2021-05-12 MED ORDER — CLINDAMYCIN PHOSPHATE 900 MG/50ML IV SOLN
INTRAVENOUS | Status: DC | PRN
  Administered 2021-05-12: 900 mg via INTRAVENOUS

## 2021-05-12 MED ORDER — VANCOMYCIN HCL 1000 MG IV SOLR
INTRAVENOUS | Status: DC | PRN
  Administered 2021-05-12: 1000 mg via TOPICAL

## 2021-05-12 MED ORDER — ONDANSETRON HCL 4 MG/2ML IJ SOLN
INTRAMUSCULAR | Status: AC
  Filled 2021-05-12: qty 2

## 2021-05-12 MED ORDER — ONDANSETRON HCL 4 MG/2ML IJ SOLN: 4.0000 mg | Freq: Once | INTRAMUSCULAR | Status: DC | PRN

## 2021-05-12 MED ORDER — OXYCODONE HCL 5 MG PO TABS: 5.0000 mg | ORAL_TABLET | Freq: Once | ORAL | Status: DC | PRN

## 2021-05-12 MED ORDER — ONDANSETRON HCL 4 MG/2ML IJ SOLN
INTRAMUSCULAR | Status: DC | PRN
  Administered 2021-05-12: 4 mg via INTRAVENOUS

## 2021-05-12 MED ORDER — PROPOFOL 10 MG/ML IV BOLUS
INTRAVENOUS | Status: DC | PRN
  Administered 2021-05-12 (×2): 20 mg via INTRAVENOUS

## 2021-05-12 MED ORDER — ORAL CARE MOUTH RINSE
15.0000 mL | Freq: Once | OROMUCOSAL | Status: AC
Start: 2021-05-12 — End: 2021-05-12

## 2021-05-12 MED ORDER — CHLORHEXIDINE GLUCONATE 0.12 % MT SOLN
15.0000 mL | Freq: Once | OROMUCOSAL | Status: AC
  Administered 2021-05-12: 15 mL via OROMUCOSAL

## 2021-05-12 MED ORDER — OXYCODONE HCL 5 MG PO TABS
ORAL_TABLET | ORAL | Status: AC
  Filled 2021-05-12: qty 1

## 2021-05-12 MED ORDER — CLINDAMYCIN PHOSPHATE 900 MG/50ML IV SOLN
900.0000 mg | INTRAVENOUS | Status: DC
  Filled 2021-05-12: qty 50

## 2021-05-12 MED ORDER — OXYCODONE-ACETAMINOPHEN 10-325 MG PO TABS: 1.0000 | ORAL_TABLET | ORAL | 0 refills | Status: DC | PRN

## 2021-05-12 SURGICAL SUPPLY — 57 items
APLIGRAF (Prosthesis & Implant Plastic) ×2 IMPLANT
BAG COUNTER SPONGE SURGICOUNT (BAG) IMPLANT
BAG SPNG CNTER NS LX DISP (BAG)
BLADE OSCILLATING/SAGITTAL (BLADE) ×2
BLADE SURG 15 STRL LF DISP TIS (BLADE) IMPLANT
BLADE SURG 15 STRL SS (BLADE)
BLADE SW THK.38XMED LNG THN (BLADE) IMPLANT
BNDG CMPR 9X4 STRL LF SNTH (GAUZE/BANDAGES/DRESSINGS) ×1
BNDG CONFORM 4 STRL LF (GAUZE/BANDAGES/DRESSINGS) ×2 IMPLANT
BNDG ELASTIC 4X5.8 VLCR STR LF (GAUZE/BANDAGES/DRESSINGS) ×1 IMPLANT
BNDG ELASTIC 6X5.8 VLCR STR LF (GAUZE/BANDAGES/DRESSINGS) ×2 IMPLANT
BNDG ESMARK 4X9 LF (GAUZE/BANDAGES/DRESSINGS) ×1 IMPLANT
BNDG GAUZE ELAST 4 BULKY (GAUZE/BANDAGES/DRESSINGS) ×1 IMPLANT
BUR WEDGE MICA 3.1X13 (BURR) IMPLANT
BURR WEDGE MICA 3.1X13 (BURR) ×2
CLEANER TIP ELECTROSURG 2X2 (MISCELLANEOUS) IMPLANT
CNTNR URN SCR LID CUP LEK RST (MISCELLANEOUS) IMPLANT
CONT SPEC 4OZ STRL OR WHT (MISCELLANEOUS)
COVER SURGICAL LIGHT HANDLE (MISCELLANEOUS) ×2 IMPLANT
CUFF CONTOUR SPSB 18 (TOURNIQUET CUFF) ×1 IMPLANT
CUFF TOURN SGL QUICK 24 (TOURNIQUET CUFF)
CUFF TRNQT CYL 24X4X16.5-23 (TOURNIQUET CUFF) IMPLANT
DECANTER SPIKE VIAL GLASS SM (MISCELLANEOUS) IMPLANT
DRAPE INCISE IOBAN 66X45 STRL (DRAPES) ×1 IMPLANT
DRAPE OEC MINIVIEW 54X84 (DRAPES) ×1 IMPLANT
DRSG MEPITEL 8X12 (GAUZE/BANDAGES/DRESSINGS) ×1 IMPLANT
GAUZE 4X4 16PLY ~~LOC~~+RFID DBL (SPONGE) ×1 IMPLANT
GAUZE SPONGE 4X4 12PLY STRL (GAUZE/BANDAGES/DRESSINGS) ×2 IMPLANT
GAUZE XEROFORM 1X8 LF (GAUZE/BANDAGES/DRESSINGS) ×2 IMPLANT
GLOVE SRG 8 PF TXTR STRL LF DI (GLOVE) ×1 IMPLANT
GLOVE SURG ENC MOIS LTX SZ8 (GLOVE) ×2 IMPLANT
GLOVE SURG LTX SZ8 (GLOVE) ×2 IMPLANT
GLOVE SURG UNDER POLY LF SZ8 (GLOVE) ×2
GOWN STRL REUS W/TWL XL LVL3 (GOWN DISPOSABLE) ×2 IMPLANT
GRAFT APLIGRAF (Prosthesis & Implant Plastic) IMPLANT
IV CATH 18GX1.25 SAFE RETR GRN (IV SOLUTION) ×1 IMPLANT
KIT BASIN OR (CUSTOM PROCEDURE TRAY) ×2 IMPLANT
KIT TURNOVER KIT A (KITS) ×2 IMPLANT
NDL HYPO 25X1 1.5 SAFETY (NEEDLE) ×1 IMPLANT
NDL SAFETY ECLIPSE 18X1.5 (NEEDLE) IMPLANT
NEEDLE HYPO 18GX1.5 SHARP (NEEDLE) ×2
NEEDLE HYPO 25X1 1.5 SAFETY (NEEDLE) ×2 IMPLANT
PACK ORTHO EXTREMITY (CUSTOM PROCEDURE TRAY) ×2 IMPLANT
PADDING UNDERCAST 2 STRL (CAST SUPPLIES) ×1
PADDING UNDERCAST 2X4 STRL (CAST SUPPLIES) ×1 IMPLANT
PENCIL SMOKE EVACUATOR (MISCELLANEOUS) ×2 IMPLANT
SPONGE T-LAP 4X18 ~~LOC~~+RFID (SPONGE) ×1 IMPLANT
STAPLER VISISTAT 35W (STAPLE) ×3 IMPLANT
SUT ETHILON 4 0 PS 2 18 (SUTURE) ×2 IMPLANT
SUT VIC AB 4-0 PS2 27 (SUTURE) ×2 IMPLANT
SYR 20ML LL LF (SYRINGE) IMPLANT
SYR CONTROL 10ML LL (SYRINGE) ×1 IMPLANT
TOWEL OR 17X26 10 PK STRL BLUE (TOWEL DISPOSABLE) ×2 IMPLANT
TOWEL OR NON WOVEN STRL DISP B (DISPOSABLE) ×2 IMPLANT
TRAY PREP A LATEX SAFE STRL (SET/KITS/TRAYS/PACK) ×2 IMPLANT
TUBE SURG IRRIGATION (TUBING) ×1 IMPLANT
UNDERPAD 30X36 HEAVY ABSORB (UNDERPADS AND DIAPERS) ×4 IMPLANT

## 2021-05-12 NOTE — Discharge Instructions (Addendum)
  After Surgery Instructions   1) If you are recuperating from surgery anywhere other than home, please be sure to leave us the number where you can be reached.  2) Go directly home and rest.  3) Keep the operated foot(feet) elevated six inches above the hip when sitting or lying down. This will help control swelling and pain.  4) Support the elevated foot and leg with pillows. DO NOT PLACE PILLOWS UNDER THE KNEE.  5) DO NOT REMOVE or get your bandages WET, unless you were given different instructions by your doctor to do so. This increases the risk of infection.  6) Wear your surgical shoe or surgical boot at all times when you are up on your feet.  7) A limited amount of pain and swelling may occur. The skin may take on a bruised appearance. DO NOT BE ALARMED, THIS IS NORMAL.  8) For slight pain and swelling, apply an ice pack directly over the bandages for 15 minutes only out of each hour of the day. Continue until seen in the office for your first post op visit. DO NOT APPLY ANY FORM OF HEAT TO THE AREA.  9) Have prescriptions filled immediately and take as directed.  10) Drink lots of liquids, water and juice to stay hydrated.  11) CALL IMMEDIATELY IF:  *Bleeding continues until the following day of surgery  *Pain increases and/or does not respond to medication  *Bandages or cast appears to tight  *If your bandage gets wet  *Trip, fall or stump your surgical foot  *If your temperature goes above 101  *If you have ANY questions at all  12) You are expected to be weightbearing after your surgery.   If you need to reach the nurse for any reason, please call: [place]: [phone] [place]: [phone] [place]: [phone]  

## 2021-05-12 NOTE — Progress Notes (Signed)
Orthopedic Tech Progress Note Patient Details:  Katherine Herrera January 15, 1966 [id number] CAM Walker applied to right foot. Patient also requested a post op shoe for her left foot which was provided  Ortho Devices Type of Ortho Device: CAM walker, Postop shoe/boot Ortho Device/Splint Location: Right Foot Ortho Device/Splint Interventions: Application   Post Interventions Patient Tolerated: Well Instructions Provided: Adjustment of device  Aizik Reh E Nanetta Wiegman 05/12/2021, 3:12 PM

## 2021-05-12 NOTE — Anesthesia Preprocedure Evaluation (Signed)
Anesthesia Evaluation  Patient identified by MRN, date of birth, ID band Patient awake    Reviewed: Allergy & Precautions, NPO status , Patient's Chart, lab work & pertinent test results  Airway Mallampati: II  TM Distance: >3 FB Neck ROM: Full    Dental no notable dental hx.    Pulmonary COPD, Current Smoker,    Pulmonary exam normal breath sounds clear to auscultation       Cardiovascular hypertension, + DOE  Normal cardiovascular exam Rhythm:Regular Rate:Normal     Neuro/Psych negative neurological ROS  negative psych ROS   GI/Hepatic negative GI ROS, Neg liver ROS,   Endo/Other  diabetes, Type 1, Insulin DependentHypothyroidism   Renal/GU negative Renal ROS  negative genitourinary   Musculoskeletal negative musculoskeletal ROS (+)   Abdominal   Peds negative pediatric ROS (+)  Hematology negative hematology ROS (+)   Anesthesia Other Findings   Reproductive/Obstetrics negative OB ROS                             Anesthesia Physical Anesthesia Plan  ASA: 3  Anesthesia Plan: MAC   Post-op Pain Management:    Induction: Intravenous  PONV Risk Score and Plan: 2 and Propofol infusion  Airway Management Planned: Simple Face Mask  Additional Equipment:   Intra-op Plan:   Post-operative Plan:   Informed Consent: I have reviewed the patients History and Physical, chart, labs and discussed the procedure including the risks, benefits and alternatives for the proposed anesthesia with the patient or authorized representative who has indicated his/her understanding and acceptance.     Dental advisory given  Plan Discussed with: CRNA and Surgeon  Anesthesia Plan Comments:         Anesthesia Quick Evaluation

## 2021-05-12 NOTE — Anesthesia Procedure Notes (Signed)
Procedure Name: MAC Date/Time: 05/12/2021 12:30 PM Performed by: Cynda Familia, CRNA Pre-anesthesia Checklist: Patient identified, Emergency Drugs available, Suction available, Patient being monitored and Timeout performed Patient Re-evaluated:Patient Re-evaluated prior to induction Oxygen Delivery Method: Simple face mask Placement Confirmation: positive ETCO2 and breath sounds checked- equal and bilateral Dental Injury: Teeth and Oropharynx as per pre-operative assessment

## 2021-05-12 NOTE — Interval H&P Note (Signed)
History and Physical Interval Note:  05/12/2021 12:19 PM  Katherine Herrera  has presented today for surgery, with the diagnosis of Ulcer right foot, 2nd to hammertoe.  The various methods of treatment have been discussed with the patient and family. After consideration of risks, benefits and other options for treatment, the patient has consented to  Procedure(s): Right 5th met base resection (Right) as a surgical intervention.  The patient's history has been reviewed, patient examined, no change in status, stable for surgery.  I have reviewed the patient's chart and labs.  Questions were answered to the patient's satisfaction.     Evelina Bucy

## 2021-05-12 NOTE — Op Note (Signed)
Patient Name: [name] DOB: Aug 12, 1966  MRN: [id number]   Date of Surgery: 05/12/2021  Surgeon: Dr. Hardie Pulley, DPM Assistants: none  Pre-operative Diagnosis:  Hammertoe right 2nd toe, exostosis of bone, ulcer right foot Post-operative Diagnosis:  Same Procedures:  1) Right 2nd hammertoe correction  2) Right 5th metatarsal base resection  3) Application of skin graft substitute Pathology/Specimens: * No specimens in log * Anesthesia: MAC/local Hemostasis: * Missing tourniquet times found for documented tourniquets in log: [id number] * Estimated Blood Loss: 10 mL Materials:  Implant Name Type Inv. Item Serial No. Manufacturer Lot No. LRB No. Used Action  APLIGRAF - [id number] Prosthesis & Implant Plastic APLIGRAF  [employer] [id number] Right 1 Implanted   Medications: 10 ml marcaine 0.5% plain. Complications: none  Indications for Procedure:  This is a 55 y.o. female with a chronic wound to the right foot as well as a hammertoe deformity of the second toe.  She presents today for surgical intervention for these issues.  All risk benefits guarantees given.   Procedure in Detail: Patient was identified in pre-operative holding area. Formal consent was signed and the right lower extremity was marked. Patient was brought back to the operating room. Anesthesia was induced. The extremity was prepped and draped in the usual sterile fashion. Timeout was taken to confirm patient name, laterality, and procedure prior to incision.   Attention was then directed to the right foot.  There was a small ulceration of the plantar aspect of the right fifth metatarsal.  This was covered with Ioban.  Attention was then directed to the 2nd toe.  Incision was made at the dorsal to the digit centered over the interphalangeal joint dissection was carried down to the level of the extensor tendon.  The extensor tendon was transected distal to the proximal phalangeal joint.  The tendon was  freed off the proximal phalanx and the phalangeal head was exposed.  The proximal phalangeal head was excised with a sagittal saw.  The middle phalangeal base was then resected with sagittal saw to denude it of cartilage.    There was residual deformity at the distal interphalangeal joint.  Incision was then made at the plantar aspect of the distal interphalangeal joint.  The blade was rotated to release the flexor tendon and thus release the DIPJ contracture. At this point the toe was able to be reduced to rectus position.  The wound was then irrigated and the extensor complex was repaired with 4-0 Monocryl and the skin with 4-0 nylon.  Attention was then directed to the lateral aspect of the right foot at the fifth metatarsal area.  A small incision was made overlying this area and blunt dissection was continued until the fifth metatarsal base bone was palpated.  A Stryker ProStep Trudee Kuster was then utilized to resect prominent aspect of the plantar fifth metatarsal bone.  This was done under fluoroscopic guidance correction of the prominence was noted.  The wound was then copiously irrigated bone paste was extruded, further irrigated and the skin was closed with 4 nylon.  Attention was then directed to the plantar fifth metatarsal wound.  The Ioban was incised at this time.  The wound was then sharply excisionally debrided with a 15 blade to bleeding viable wound base. The wound at the plantar 5th metatarsal base measuring 2x2 post-debridement.  The wound was thoroughly irrigated and Apligraf was applied and sutured into position with chromic gut suture.  This was then covered with Mepitel.  The foot was  then dressed with xeroform to remaining incisions, 4x4, kerlix, ACE bandage. Patient tolerated the procedure well.   Disposition: Following a period of post-operative monitoring, patient will be transferred home.

## 2021-05-12 NOTE — Progress Notes (Addendum)
Hypoglycemic Event  CBG: 42 at 1022  Treatment: D50 50 mL (25 gm) given at 1028  Symptoms: feels "sluggish",awake and alert  Follow-up CBG  Time:  1100 CBG Result: 124  Possible Reasons for Event: NPO for surgery, patient took full dose of insulin at bedtime and half dose of insulin at 0845 this morning.    Comments/MD notified:Dr.Rose notified, he ordered full amp of dextrose.    Katherine Herrera, Dahlia Byes

## 2021-05-12 NOTE — Brief Op Note (Signed)
05/12/2021  1:48 PM  PATIENT:  Katherine Herrera  55 y.o. female  PRE-OPERATIVE DIAGNOSIS:  Ulcer right foot, 2nd to hammertoe  POST-OPERATIVE DIAGNOSIS:  Ulcer right foot, 2nd to hammertoe  PROCEDURE:  Procedure(s): Right 5th met base resection (Right) Hammertoe correction right 2nd toe Wound debridement right foot Application of skin graft substitute  SURGEON:  Surgeon(s) and Role:    Evelina Bucy, DPM - Primary  PHYSICIAN ASSISTANT:   ASSISTANTS: none   ANESTHESIA:   MAC  EBL:  10 mL   BLOOD ADMINISTERED:none  DRAINS: none   LOCAL MEDICATIONS USED:  MARCAINE 20 ml    SPECIMEN:  No Specimen  DISPOSITION OF SPECIMEN:  N/A  COUNTS:  YES  TOURNIQUET:  * Missing tourniquet times found for documented tourniquets in log: [id number] *  DICTATION: .Dragon Dictation  PLAN OF CARE: Discharge to home after PACU  PATIENT DISPOSITION:  PACU - hemodynamically stable.   Delay start of Pharmacological VTE agent (>24hrs) due to surgical blood loss or risk of bleeding: not applicable

## 2021-05-12 NOTE — Progress Notes (Signed)
Anesthesia H&P Update: History and Physical Exam reviewed; patient is OK for planned anesthetic and procedure. ? ?

## 2021-05-12 NOTE — Transfer of Care (Signed)
Immediate Anesthesia Transfer of Care Note  Patient: EVOLA HOLLIS  Procedure(s) Performed: Right 5th met base resection (Right: Toe)  Patient Location: PACU  Anesthesia Type:MAC  Level of Consciousness: awake, alert  and oriented  Airway & Oxygen Therapy: Patient Spontanous Breathing and Patient connected to face mask oxygen  Post-op Assessment: Report given to RN, Post -op Vital signs reviewed and stable and Patient moving all extremities X 4  Post vital signs: Reviewed and stable  Last Vitals:  Vitals Value Taken Time  BP    Temp    Pulse 57 05/12/21 1349  Resp 10 05/12/21 1349  SpO2 98 % 05/12/21 1349  Vitals shown include unvalidated device data.  Last Pain:  Vitals:   05/12/21 1037  TempSrc:   PainSc: 5       Patients Stated Pain Goal: 4 (16/10/96 0454)  Complications: No notable events documented.

## 2021-05-12 NOTE — Anesthesia Postprocedure Evaluation (Signed)
Anesthesia Post Note  Patient: [name]  Procedure(s) Performed: Right 5th met base resection (Right: Toe)     Patient location during evaluation: PACU Anesthesia Type: MAC Level of consciousness: awake and alert Pain management: pain level controlled Vital Signs Assessment: post-procedure vital signs reviewed and stable Respiratory status: spontaneous breathing, nonlabored ventilation, respiratory function stable and patient connected to nasal cannula oxygen Cardiovascular status: stable and blood pressure returned to baseline Postop Assessment: no apparent nausea or vomiting Anesthetic complications: no   No notable events documented.  Last Vitals:  Vitals:   05/12/21 1348 05/12/21 1400  BP: 98/61 (!) 96/51  Pulse: (!) 57 (!) 58  Resp: 10 11  Temp: 36.6 C   SpO2: 98% 100%    Last Pain:  Vitals:   05/12/21 1412  TempSrc:   PainSc: 9                  Lumir Demetriou S

## 2021-05-13 ENCOUNTER — Encounter (HOSPITAL_COMMUNITY): Payer: Self-pay | Admitting: Podiatry

## 2021-05-13 ENCOUNTER — Telehealth: Payer: Self-pay | Admitting: *Deleted

## 2021-05-13 NOTE — Chronic Care Management (AMB) (Signed)
  Chronic Care Management   Note  05/13/2021 Name: MARIECLAIRE BETTENHAUSEN MRN: [id number] DOB: 05/09/1966  HUBERTA TOMPKINS is a 55 y.o. year old female who is a primary care patient of Plotnikov, [name], MD. I reached out to [name] by phone today in response to a referral sent by Ms. Chauncey Reading Manes's PCP Plotnikov, [name], MD     Ms. Bergeron was given information about Chronic Care Management services today including:  CCM service includes personalized support from designated clinical staff supervised by her physician, including individualized plan of care and coordination with other care providers 24/7 contact phone numbers for assistance for urgent and routine care needs. Service will only be billed when office clinical staff spend 20 minutes or more in a month to coordinate care. Only one practitioner may furnish and bill the service in a calendar month. The patient may stop CCM services at any time (effective at the end of the month) by phone call to the office staff. The patient will be responsible for cost sharing (co-pay) of up to 20% of the service fee (after annual deductible is met).  Patient agreed to services and verbal consent obtained.   Follow up plan: Telephone appointment with care management team member scheduled for:05/27/2021  Julian Hy, [place], [place] Management  Direct Dial: [phone]

## 2021-05-18 ENCOUNTER — Ambulatory Visit (INDEPENDENT_AMBULATORY_CARE_PROVIDER_SITE_OTHER): Payer: Medicare Other

## 2021-05-18 ENCOUNTER — Other Ambulatory Visit: Payer: Self-pay

## 2021-05-18 ENCOUNTER — Ambulatory Visit (INDEPENDENT_AMBULATORY_CARE_PROVIDER_SITE_OTHER): Payer: Medicare Other | Admitting: Podiatry

## 2021-05-18 DIAGNOSIS — E11621 Type 2 diabetes mellitus with foot ulcer: Secondary | ICD-10-CM | POA: Diagnosis not present

## 2021-05-18 DIAGNOSIS — Z9889 Other specified postprocedural states: Secondary | ICD-10-CM

## 2021-05-18 DIAGNOSIS — L97415 Non-pressure chronic ulcer of right heel and midfoot with muscle involvement without evidence of necrosis: Secondary | ICD-10-CM

## 2021-05-18 NOTE — Progress Notes (Signed)
  Subjective:  Patient ID: Katherine Herrera, female    DOB: 08-15-1966,  MRN: [id number]  Chief Complaint  Patient presents with   Routine Post Op    POV #1 DOS 05/12/2021 RT FOOT WOUND DEBRIDMENT & IRRIGATION W/POSS SKIN GRAFT SUB, PARTIAL EXCSION OF 5TH METATARSAL BASE BONE, FLEXOR TENOTOMY, POSS ARTHROPLASTY OF 2ND TOE. Pt complains of  a lot of pain in her toes. No NV, fever or chills.     DOS: 05/12/21 Procedure: RT FOOT WOUND DEBRIDMENT & IRRIGATION W/POSS SKIN GRAFT SUB, PARTIAL EXCSION OF 5TH METATARSAL BASE BONE, FLEXOR TENOTOMY, ARTHROPLASTY OF 2ND TOE    55 y.o. female presents with the above complaint. History confirmed with patient.   Objective:  Physical Exam: tenderness at the surgical site, local edema noted, and calf supple, nontender. Incision: healing well, no significant drainage, no dehiscence, no significant erythema Plantar 5th met wound healing well, 1x1 today with fully granular base.  No images are attached to the encounter.  Radiographs: X-ray of the right foot: consistent with post-op state without acute complication  Assessment:   1. Post-operative state   2. Diabetic ulcer of right midfoot associated with type 2 diabetes mellitus, with muscle involvement without evidence of necrosis [facility])     Plan:  Patient was evaluated and treated and all questions answered.  Post-operative State -XR reviewed with patient -WBAT in CAM boot -Wound cleansed and periwound debrided. Puraply 3.02x3.02 LOT [id number] exp 08/03/23 applied. Secondary dressing of mepitel, 4x4, kerlix, ACE bandage. -XRs needed at follow-up: none  No follow-ups on file.

## 2021-05-25 ENCOUNTER — Other Ambulatory Visit: Payer: Self-pay

## 2021-05-25 ENCOUNTER — Ambulatory Visit (INDEPENDENT_AMBULATORY_CARE_PROVIDER_SITE_OTHER): Payer: Medicare Other | Admitting: Podiatry

## 2021-05-25 DIAGNOSIS — L97415 Non-pressure chronic ulcer of right heel and midfoot with muscle involvement without evidence of necrosis: Secondary | ICD-10-CM

## 2021-05-25 DIAGNOSIS — E11621 Type 2 diabetes mellitus with foot ulcer: Secondary | ICD-10-CM | POA: Diagnosis not present

## 2021-05-25 NOTE — Progress Notes (Signed)
  Subjective:  Patient ID: Katherine Herrera, female    DOB: 09/25/1966,  MRN: [id number]  Chief Complaint  Patient presents with   Routine Post Op    POV right midfoot. Pt states she is doing well.    Rash    Skin rash and blister of left ankle x 2 days. Redness associated with itching and burning.    DOS: 05/12/21 Procedure: RT FOOT WOUND DEBRIDMENT & IRRIGATION W/POSS SKIN GRAFT SUB, PARTIAL EXCSION OF 5TH METATARSAL BASE BONE, FLEXOR TENOTOMY, ARTHROPLASTY OF 2ND TOE    55 y.o. female presents with the above complaint. History confirmed with patient.   Objective:  Physical Exam: tenderness at the surgical site, local edema noted, and calf supple, nontender. Incision: healing well, no significant drainage, no dehiscence, no significant erythema Plantar 5th met wound healing well, 1x1 today with fully granular base.  No images are attached to the encounter.  Radiographs: X-ray of the right foot: consistent with post-op state without acute complication  Assessment:   1. Diabetic ulcer of right midfoot associated with type 2 diabetes mellitus, with muscle involvement without evidence of necrosis [facility])    Plan:  Patient was evaluated and treated and all questions answered.  Post-operative State -XR reviewed with patient -WBAT in CAM boot -Wound cleansed and minimally debrided. Puraply applied -XRs needed at follow-up: none  Procedure: Application of Skin Graft Substitute  Wound Measurements: 1x1.5 cm Type of Debridement: sharp Wound Graft: Puraply AM Secondary Dressing: mepitel, sterile gauze, kerlix, and ABD PAD  Dressing: Dry, sterile, compression dressing. Disposition: Patient tolerated procedure well.   Return in about 1 week (around 06/01/2021) for Post-Op (No XRs).

## 2021-05-27 ENCOUNTER — Ambulatory Visit (INDEPENDENT_AMBULATORY_CARE_PROVIDER_SITE_OTHER): Payer: Medicare Other | Admitting: *Deleted

## 2021-05-27 DIAGNOSIS — E1140 Type 2 diabetes mellitus with diabetic neuropathy, unspecified: Secondary | ICD-10-CM

## 2021-05-27 DIAGNOSIS — E785 Hyperlipidemia, unspecified: Secondary | ICD-10-CM

## 2021-05-28 ENCOUNTER — Telehealth: Payer: Self-pay | Admitting: *Deleted

## 2021-05-28 ENCOUNTER — Telehealth: Payer: Self-pay | Admitting: Internal Medicine

## 2021-05-28 MED ORDER — BACLOFEN 10 MG PO TABS: 20.0000 mg | ORAL_TABLET | Freq: Two times a day (BID) | ORAL | 1 refills | Status: DC | PRN

## 2021-05-28 NOTE — Telephone Encounter (Signed)
Reviewed chart pt is up-to-date [facility]ills [facility]Marland KitchenJohny Chess

## 2021-05-28 NOTE — Telephone Encounter (Signed)
-----   Message from [place] sent at 05/28/2021 10:22 AM EDT ----- Refill request

## 2021-05-28 NOTE — Patient Instructions (Signed)
Visit Information   Katherine Herrera, Katherine Herrera) it was nice talking with you today.   Please read over the attached information, we will discuss this during our future telephone appointment visits    I look forward to talking to you again for an update on Tuesday June 22, 2021 at 3:30 pm- please be listening out for my call that day.  I will call as close to 3:30 pm as possible.  If you need to cancel or re-schedule our telephone visit, please call [phone] and one of our care guides will be happy to assist you.  I look forward to hearing about your progress.   Please don't hesitate to contact me if I can be of assistance to you before our next scheduled appointment.   Katherine Rack, RN, BSN, [facility] RN Care Coordination- [place] [phone]: direct office [phone]: mobile     PATIENT GOALS:   Goals Addressed             This Visit's Progress    Monitor and Manage My Blood Sugar-Diabetes Type 2   On track    Timeframe:  Long-Range Goal Priority:  Medium Start Date:           05/27/21                  Expected End Date:        05/27/22               Follow Up Date 06/22/21    Monitor and write down blood sugar 3-4 times per day Check blood sugar more often if I feel it is too high or too low or if I feel sick Take medications as prescribed Write down questions for my endocrinologist (diabetes doctor) Keep cholesterol low by eating the right foods   Why is this important?   Checking your blood sugar at home helps to keep it from getting very high or very low.  Writing the results in a diary or log helps the doctor know how to care for you.  Your blood sugar log should have the time, date and the results.  Also, write down the amount of insulin or other medicine that you take.  Other information, like what you ate, exercise done and how you were feeling, will also be helpful.            Cholesterol Content in Foods Cholesterol is a  waxy, fat-like substance that helps to carry fat in the blood. The body needs cholesterol in small amounts, but too much cholesterol can causedamage to the arteries and heart. Most people should eat less than 200 milligrams (mg) of cholesterol a day. Foods with cholesterol  Cholesterol is found in animal-based foods, such as meat, seafood, and dairy. Generally, low-fat dairy and lean meats have less cholesterol than full-fat dairy and fatty meats. The milligrams of cholesterol per serving (mg per serving) of common cholesterol-containing foods are listed below. Meat and other proteins Egg -- one large whole egg has 186 mg. Veal shank -- 4 oz has 141 mg. Lean ground [place] (93% lean) -- 4 oz has 118 mg. Fat-trimmed lamb loin -- 4 oz has 106 mg. Lean ground beef (90% lean) -- 4 oz has 100 mg. Lobster -- 3.5 oz has 90 mg. Pork loin chops -- 4 oz has 86 mg. Canned salmon -- 3.5 oz has 83 mg. Fat-trimmed beef top loin -- 4 oz has 78 mg. Frankfurter -- 1  frank (3.5 oz) has 77 mg. Crab -- 3.5 oz has 71 mg. Roasted chicken without skin, white meat -- 4 oz has 66 mg. Light bologna -- 2 oz has 45 mg. Deli-cut [place] -- 2 oz has 31 mg. Canned tuna -- 3.5 oz has 31 mg. Katherine Herrera -- 1 oz has 29 mg. Oysters and mussels (raw) -- 3.5 oz has 25 mg. Mackerel -- 1 oz has 22 mg. Trout -- 1 oz has 20 mg. Pork sausage -- 1 link (1 oz) has 17 mg. Salmon -- 1 oz has 16 mg. Tilapia -- 1 oz has 14 mg. Dairy Soft-serve ice cream --  cup (4 oz) has 103 mg. Whole-milk yogurt -- 1 cup (8 oz) has 29 mg. Cheddar cheese -- 1 oz has 28 mg. American cheese -- 1 oz has 28 mg. Whole milk -- 1 cup (8 oz) has 23 mg. 2% milk -- 1 cup (8 oz) has 18 mg. Cream cheese -- 1 tablespoon (Tbsp) has 15 mg. Cottage cheese --  cup (4 oz) has 14 mg. Low-fat (1%) milk -- 1 cup (8 oz) has 10 mg. Sour cream -- 1 Tbsp has 8.5 mg. Low-fat yogurt -- 1 cup (8 oz) has 8 mg. Nonfat Greek yogurt -- 1 cup (8 oz) has 7 mg. Half-and-half  cream -- 1 Tbsp has 5 mg. Fats and oils Cod liver oil -- 1 tablespoon (Tbsp) has 82 mg. Butter -- 1 Tbsp has 15 mg. Lard -- 1 Tbsp has 14 mg. Bacon grease -- 1 Tbsp has 14 mg. Mayonnaise -- 1 Tbsp has 5-10 mg. Margarine -- 1 Tbsp has 3-10 mg. Exact amounts of cholesterol in these foods may vary depending on specificingredients and brands. Foods without cholesterol Most plant-based foods do not have cholesterol unless you combine them with a food that has cholesterol. Foods without cholesterol include: Grains and cereals. Vegetables. Fruits. Vegetable oils, such as olive, canola, and sunflower oil. Legumes, such as peas, beans, and lentils. Nuts and seeds. Egg whites. Summary The body needs cholesterol in small amounts, but too much cholesterol can cause damage to the arteries and heart. Most people should eat less than 200 milligrams (mg) of cholesterol a day. This information is not intended to replace advice given to you by your health care provider. Make sure you discuss any questions you have with your healthcare provider. Document Revised: 01/28/2020 Document Reviewed: 03/09/2020 Elsevier Patient Education  2022 [employer].   Consent to CCM Services: Katherine Herrera was given information about Chronic Care Management services 05/13/21 including:  CCM service includes personalized support from designated clinical staff supervised by her physician, including individualized plan of care and coordination with other care providers 24/7 contact phone numbers for assistance for urgent and routine care needs. Service will only be billed when office clinical staff spend 20 minutes or more in a month to coordinate care. Only one practitioner may furnish and bill the service in a calendar month. The patient may stop CCM services at any time (effective at the end of the month) by phone call to the office staff. The patient will be responsible for cost sharing (co-pay) of up to 20% of the  service fee (after annual deductible is met).  Patient agreed to services and verbal consent obtained.   The patient verbalized understanding of instructions, educational materials, and care plan provided today and agreed to receive a mailed copy of patient instructions, educational materials, and care plan.  Telephone follow up appointment with care management team member  scheduled for:  Tuesday June 22, 2021 The patient has been provided with contact information for the care management team and has been advised to call with any health related questions or concerns.   Katherine Rack, RN, BSN, [facility] RN Care Coordination- [place] [phone]: direct office [phone]: mobile   CLINICAL CARE PLAN: Patient Care Plan: Diabetes Type 2 (Adult)     Problem Identified: Glycemic Management (Diabetes, Type 2)   Priority: Medium     Long-Range Goal: Glycemic Management Optimized   Start Date: 05/27/2021  Expected End Date: 05/27/2022  This Visit's Progress: On track  Priority: Medium  Note:   Objective:  Lab Results  Component Value Date   HGBA1C 8.2 (H) 05/07/2021   Lab Results  Component Value Date   CREATININE 0.95 05/07/2021   CREATININE 1.17 (H) 01/27/2021   CREATININE 1.04 10/21/2020   No results found for: EGFR Current Barriers:  Knowledge Deficits related to basic Diabetes pathophysiology and self care/management- could benefit from ongoing reinforcement and support Occasionally forgets insulin- midday dose (while family visiting)  Case Manager Clinical Goal(s):  Over the next 12 months, patient will demonstrate ongoing adherence to prescribed treatment plan for diabetes self care/management as evidenced by patient reporting during CCM RN CM outreach of:  Daily monitoring and recording of CBG 2-4 times/ day Adherence to ADA/ carb modified, low sugar diet  Adherence to prescribed medication regimen  Contacting care providers for new  or worsened symptoms, concerns or questions Interventions:  Collaboration with Plotnikov, [name], MD regarding development and update of comprehensive plan of care as evidenced by provider attestation and co-signature Inter-disciplinary care team collaboration (see longitudinal plan of care Review of patient status, including review of consultants reports, relevant laboratory and other test results, and medications completed Chart reviewed including relevant office notes, upcoming scheduled appointments, and lab results, recent surgical procedure on (R) foot- secondary to diabetic ulcer per notes Discussed current  clinical condition with patient and confirmed no current clinical concerns; reports (R) foot healing well; describes new area of blistering on (L) foot- thinks possible spider bite: had podiatry visit yesterday, provider assessed- encouraged patient to monitor/ assess area daily and to contact provider for any worsening or ongoing concerns Assessed baseline knowledge around self-health management of DM and provided additional education to patient about basic DM disease process as indicated Confirmed patient  manages medications independently; reports overall good compliance/ adherence, but reports family has been visiting for 2 weeks and she has occasionally forgotten/ got busy with grandkids and missed mid-day insulin dose: reports family departs tomorrow, she expects to get back on track then Medication review:  declines today: able to verbalize appropriate dosing of insulin per medication list in EHR Confirmed patient monitors/ records blood sugars at home 2-4 times per day; reports fasting blood sugars of 130-150; reports she has been told by endocrinologist to monitor blood sugars before eating, despite that she does not use sliding scale insulin: dicussed value/ rationale of occasionally monitoring post-prandial, 2 hours after meal; fasting blood sugar this morning : 134 Advised  patient, providing education and rationale, to continue monitoring/ recording blood sugars at home as per baseline for now Discussed significance/ value of A1-C level and reviewed patient's historical A1-C trends  Confirmed patient endorses "trying to" follow low carbohydrate, low sugar heart healthy diet-- feels she is overall adherent; discussed patient' concurrent diagnosis of HLD- patient reports she controls this with diet as she was taken off  of medication- positive reinforcement provided Discussed long term complications/ cardiovascular risk in setting of DM with concurrent HLD- confirmed patient understands importance of foot care, vision monitoring, etc Discussed value of making list of questions to take to next endocrinology office visit- including timing of blood sugar monitoring and possibility of starting trulicity/ ozempic Reviewed upcoming provider appointments with patient/ caregiver and confirmed that patient has plans to attend all as scheduled:  podiatry office visit 06/04/21; vision provider/ eye exam "sometime in August 2022;" endocrinology provider office visit 07/06/21; PCP provider 07/29/21 Discussed plans with patient for ongoing care management follow up and provided patient with direct contact information for care management team Self-Care Activities Self administers insulin as prescribed- but occasionally forgets mid-day dose Attends all scheduled provider appointments Checks blood sugars as prescribed and utilize hyper and hypoglycemia protocol as needed Patient Goals: Monitor and write down blood sugar 3-4 times per day Check blood sugar more often if I feel it is too high or too low or if I feel sick Take medications as prescribed Write down questions for my endocrinologist (diabetes doctor) Keep cholesterol low by eating the right foods Follow Up Plan:  Telephone follow up appointment with care management team member scheduled for:  Tuesday June 22, 2021 The patient  has been provided with contact information for the care management team and has been advised to call with any health related questions or concerns.

## 2021-05-28 NOTE — Chronic Care Management (AMB) (Addendum)
Chronic Care Management   CCM RN Visit Note  05/28/2021 Name: Katherine Herrera MRN: [id number] DOB: [birth year]  Subjective: Katherine Herrera is a 55 y.o. year old female who is a primary care patient of Plotnikov, [name], MD. The care management team was consulted for assistance with disease management and care coordination needs.    Engaged with patient by telephone for initial visit in response to provider referral for case management and/or care coordination services.   Consent to Services:  The patient was given information about Chronic Care Management services, agreed to services, and gave verbal consent 05/13/21 prior to initiation of services.  Please see initial visit note for detailed documentation.  Patient agreed to services and verbal consent obtained.   Assessment: Review of patient past medical history, allergies, medications, health status, including review of consultants reports, laboratory and other test data, was performed as part of comprehensive evaluation and provision of chronic care management services.   SDOH (Social Determinants of Health) assessments and interventions performed:  SDOH Interventions    Flowsheet Row Most Recent Value  SDOH Interventions   Food Insecurity Interventions Intervention Not Indicated  Housing Interventions Intervention Not Indicated  Transportation Interventions Intervention Not Indicated  [Uses insurance benefit]       CCM Care Plan  Allergies  Allergen Reactions   Cephalexin Nausea And Vomiting   Demerol  [Meperidine Hcl] Rash   Lovastatin Other (See Comments)    Possible myalgia    Metformin And Related Nausea And Vomiting   Sulfamethoxazole-Trimethoprim     Other reaction(s): Unknown DILI, pancreatitis   Crestor [Rosuvastatin Calcium]     myalgia   Niacin Other (See Comments)    Unknown    Other Other (See Comments)   Hydromorphone Hcl Itching    Patient has been tolerating Hydromorphone tablets without adverse  effect (07/09/19)   Paroxetine Other (See Comments)    Unknown     Outpatient Encounter Medications as of 05/27/2021  Medication Sig Note   aspirin (BAYER ASPIRIN) 325 MG tablet Take 1 tablet (325 mg total) by mouth daily. (Patient not taking: Reported on 05/06/2021)    B-D INS SYR ULTRAFINE 1CC/31G 31G X 5/16" 1 ML MISC USE AS DIRECTED    baclofen (LIORESAL) 10 MG tablet TAKE 2 TABLETS BY MOUTH  TWICE DAILY (Patient taking differently: No sig reported)    bisacodyl (DULCOLAX) 5 MG EC tablet Take 10 mg by mouth at bedtime.    Blood Glucose Monitoring Suppl (ONETOUCH VERIO) w/Device KIT     cholecalciferol (VITAMIN D3) 25 MCG (1000 UNIT) tablet Take 1,000 Units by mouth at bedtime.    clindamycin (CLEOCIN) 150 MG capsule Take 1 capsule (150 mg total) by mouth 2 (two) times daily.    clonazePAM (KLONOPIN) 0.5 MG tablet Take 1 tablet (0.5 mg total) by mouth 2 (two) times daily as needed for anxiety.    docusate sodium (COLACE) 100 MG capsule Take 100 mg by mouth 2 (two) times daily.    ezetimibe (ZETIA) 10 MG tablet TAKE 1 TABLET BY MOUTH  DAILY (Patient taking differently: Take 10 mg by mouth daily.)    fluticasone (FLONASE) 50 MCG/ACT nasal spray Place 2 sprays into both nostrils daily. (Patient not taking: Reported on 05/06/2021)    gabapentin (NEURONTIN) 800 MG tablet TAKE 1 TABLET BY MOUTH 4  TIMES DAILY (Patient taking differently: Take 800 mg by mouth 3 (three) times daily.)    glucose blood (ONETOUCH VERIO) test strip Use to check blood  sugar 2 times per day.    insulin NPH-regular Human (70-30) 100 UNIT/ML injection Inject 10 Units into the skin 2 (two) times daily with a meal. (Patient taking differently: Inject 35-60 Units into the skin as directed. 50 units in the morning, 35 units at lunch, 60 units at dinner) 05/12/2021: 30 units at 0845,05/12/21   Insulin Syringe-Needle U-100 (SURE COMFORT INSULIN SYRINGE) 31G X 5/16" 0.5 ML MISC USE AS DIRECTED WITH INSULIN 4 TIMES A DAY    lamoTRIgine  (LAMICTAL) 25 MG tablet TAKE 1 TABLET BY MOUTH  TWICE DAILY (Patient taking differently: Take 25 mg by mouth 2 (two) times daily.)    Lancet Devices (ACCU-CHEK SOFTCLIX) lancets Test two times daily    Lancets (ONETOUCH DELICA PLUS [id number]) MISC     levothyroxine (SYNTHROID) 75 MCG tablet TAKE 1 TABLET BY MOUTH  DAILY (Patient taking differently: Take 75 mcg by mouth daily before breakfast.)    lisinopril (ZESTRIL) 10 MG tablet TAKE 1 TABLET BY MOUTH  DAILY (Patient taking differently: Take 10 mg by mouth daily.)    loratadine (CLARITIN) 10 MG tablet Take 1 tablet (10 mg total) by mouth daily as needed for allergies. (Patient not taking: Reported on 05/06/2021)    nicotine (NICODERM CQ) 21 mg/24hr patch Place 1 patch (21 mg total) onto the skin daily. Then as dirrected (Patient not taking: Reported on 05/06/2021)    ondansetron (ZOFRAN) 4 MG tablet Take 4 mg by mouth every 8 (eight) hours as needed for nausea or vomiting.    ondansetron (ZOFRAN) 4 MG tablet Take 1 tablet (4 mg total) by mouth every 8 (eight) hours as needed for nausea or vomiting.    oxyCODONE (ROXICODONE) 15 MG immediate release tablet Take 1 tablet (15 mg total) by mouth every 6 (six) hours as needed.    oxyCODONE (ROXICODONE) 15 MG immediate release tablet Take 1 tablet (15 mg total) by mouth every 6 (six) hours as needed.    oxyCODONE (ROXICODONE) 15 MG immediate release tablet Take 1 tablet (15 mg total) by mouth every 6 (six) hours as needed.    oxyCODONE-acetaminophen (PERCOCET) 10-325 MG tablet Take 1 tablet by mouth every 4 (four) hours as needed for pain.    rOPINIRole (REQUIP) 0.5 MG tablet TAKE 1 TABLET BY MOUTH AT  BEDTIME (Patient taking differently: Take 0.5 mg by mouth at bedtime.)    senna-docusate (SENOKOT-S) 8.6-50 MG tablet Take 1 tablet by mouth 2 (two) times daily. (Patient taking differently: Take 1 tablet by mouth at bedtime.)    vitamin B-12 (CYANOCOBALAMIN) 1000 MCG tablet Take 1,000 mcg by mouth daily.    No  facility-administered encounter medications on file as of 05/27/2021.   Patient Active Problem List   Diagnosis Date Noted   Essential hypertension 10/09/2020   Pure hypercholesterolemia 10/09/2020   Type 2 diabetes mellitus with hyperglycemia ([place]) 10/09/2020   Chronic osteomyelitis involving right ankle and foot ([place])    Cellulitis of right lower leg 01/10/2020   Cellulitis 01/02/2020   Skin cyanosis 01/02/2020   Soft tissue infection 01/01/2020   Septic shock ([place]) 12/31/2019   Peroneal tendonitis, right    Right foot ulcer ([place]) 07/04/2019   Capsulitis of metatarsophalangeal (MTP) joint of right foot    Exostosis of bone of foot    Preop exam for internal medicine 12/19/2018   Polyneuropathy due to type 2 diabetes mellitus ([place]) 10/01/2018   Mild renal insufficiency 09/29/2018   Hyponatremia 09/29/2018   Chronic pain 09/29/2018   Cellulitis  of left lower leg    Diabetic foot ulcer ([place])    Sepsis due to cellulitis ([place]) 09/28/2018   Syncope 07/30/2018   Earache on right 08/02/2017   Cerumen impaction 08/02/2017   Constipation due to pain medication 05/22/2017   Elevated liver enzymes 05/22/2017   AKI (acute kidney injury) ([place]) 05/03/2017   Charcot foot due to diabetes mellitus ([place]) 04/23/2017   Leg swelling 10/28/2016   Knee contusion 01/13/2016   Pain from implanted hardware 01/06/2016   Cellulitis and abscess of toe of right foot 07/24/2015   Nausea without vomiting 07/24/2015   Leg edema, left 06/19/2015   DOE (dyspnea on exertion) 06/16/2015   Chest pain, atypical 06/16/2015   Family history of musculoskeletal disease 03/12/2015   Non-compliant behavior 12/24/2014   Ulcer of right foot limited to breakdown of skin ([place]) 09/02/2014   Ulcer of foot due to diabetes mellitus ([place]) 08/12/2014   Ulcer of other part of foot 06/20/2014   Leg pain, left 03/31/2014   Callus of foot 03/25/2014   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   Rash 01/28/2014   Pain in lower limb  01/24/2014   Keratosis 01/24/2014   Leg cramps 10/09/2013   Vaginitis and vulvovaginitis 10/09/2013   Ingrown nail 09/30/2013   Pain in toe of left foot 09/30/2013   Tenosynovitis of foot and ankle 09/03/2013   Metatarsal deformity, left 09/03/2013   Pain in joint, ankle and foot 01/22/2013   Pes cavus 10/13/2012   Acquired tight Achilles tendon 10/13/2012   Paronychia 06/14/2012   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 ([employer]) 03/19/2012   Allergic rhinitis 03/19/2012   URI (upper respiratory infection) 08/30/2011   Myalgia 04/18/2011   SHOULDER PAIN 01/17/2011   TTS (tarsal tunnel syndrome)    GASTROENTERITIS 10/18/2010   ARTHRALGIA 01/04/2010   WEIGHT GAIN, ABNORMAL 01/04/2010   TACHYCARDIA 10/02/2009   CBC, ABNORMAL 05/01/2009   B12 deficiency 01/05/2009   GOUT 01/05/2009   Edema 01/05/2009   Anxiety disorder 07/28/2008   ELBOW PAIN 07/28/2008   TOBACCO USE DISORDER/SMOKER-SMOKING CESSATION DISCUSSED 04/21/2008   FOOT PAIN 04/21/2008   PARESTHESIA 04/21/2008   Abdominal pain 04/21/2008   SINUSITIS- ACUTE-NOS 11/13/2007   COPD exacerbation ([place]) 11/13/2007   Diabetic neuropathy ([place]) 08/27/2007   LOW BACK PAIN 08/27/2007   Dyslipidemia 08/20/2007   Hypothyroidism 05/22/2007   Conditions to be addressed/monitored:  HLD and DMII  Care Plan : Diabetes Type 2 (Adult)  Updates made by Knox Royalty, RN since 05/28/2021 12:00 AM     Problem: Glycemic Management (Diabetes, Type 2)   Priority: Medium     Long-Range Goal: Glycemic Management Optimized   Start Date: 05/27/2021  Expected End Date: 05/27/2022  This Visit's Progress: On track  Priority: Medium  Note:   Objective:  Lab Results  Component Value Date   HGBA1C 8.2 (H) 05/07/2021   Lab Results  Component Value Date   CREATININE 0.95 05/07/2021   CREATININE 1.17 (H) 01/27/2021   CREATININE 1.04 10/21/2020   No results found for: EGFR Current Barriers:  Knowledge Deficits related to basic  Diabetes pathophysiology and self care/management- could benefit from ongoing reinforcement and support Occasionally forgets insulin- midday dose (while family visiting)  Case Manager Clinical Goal(s):  Over the next 12 months, patient will demonstrate ongoing adherence to prescribed treatment plan for diabetes self care/management as evidenced by patient reporting during CCM RN CM outreach of:  Daily monitoring and recording of CBG 2-4 times/ day Adherence to ADA/ carb  modified, low sugar diet  Adherence to prescribed medication regimen  Contacting care providers for new or worsened symptoms, concerns or questions Interventions:  Collaboration with Plotnikov, [name], MD regarding development and update of comprehensive plan of care as evidenced by provider attestation and co-signature Inter-disciplinary care team collaboration (see longitudinal plan of care Review of patient status, including review of consultants reports, relevant laboratory and other test results, and medications completed Chart reviewed including relevant office notes, upcoming scheduled appointments, and lab results, recent surgical procedure on (R) foot- secondary to diabetic ulcer per notes Discussed current  clinical condition with patient and confirmed no current clinical concerns; reports (R) foot healing well; describes new area of blistering on (L) foot- thinks possible spider bite: had podiatry visit yesterday, provider assessed- encouraged patient to monitor/ assess area daily and to contact provider for any worsening or ongoing concerns Assessed baseline knowledge around self-health management of DM and provided additional education to patient about basic DM disease process as indicated Confirmed patient  manages medications independently; reports overall good compliance/ adherence, but reports family has been visiting for 2 weeks and she has occasionally forgotten/ got busy with grandkids and missed mid-day insulin  dose: reports family departs tomorrow, she expects to get back on track then Medication review:  declines today: able to verbalize appropriate dosing of insulin per medication list in EHR Confirmed patient monitors/ records blood sugars at home 2-4 times per day; reports fasting blood sugars of 130-150; reports she has been told by endocrinologist to monitor blood sugars before eating, despite that she does not use sliding scale insulin: dicussed value/ rationale of occasionally monitoring post-prandial, 2 hours after meal; fasting blood sugar this morning : 134 Advised patient, providing education and rationale, to continue monitoring/ recording blood sugars at home as per baseline for now Discussed significance/ value of A1-C level and reviewed patient's historical A1-C trends  Confirmed patient endorses "trying to" follow low carbohydrate, low sugar heart healthy diet-- feels she is overall adherent; discussed patient' concurrent diagnosis of HLD- patient reports she controls this with diet as she was taken off of medication- positive reinforcement provided Discussed long term complications/ cardiovascular risk in setting of DM with concurrent HLD- confirmed patient understands importance of foot care, vision monitoring, etc Discussed value of making list of questions to take to next endocrinology office visit- including timing of blood sugar monitoring and possibility of starting trulicity/ ozempic Reviewed upcoming provider appointments with patient/ caregiver and confirmed that patient has plans to attend all as scheduled:  podiatry office visit 06/04/21; vision provider/ eye exam "sometime in August 2022;" endocrinology provider office visit 07/06/21; PCP provider 07/29/21 Discussed plans with patient for ongoing care management follow up and provided patient with direct contact information for care management team Self-Care Activities Self administers insulin as prescribed- but occasionally forgets  mid-day dose Attends all scheduled provider appointments Checks blood sugars as prescribed and utilize hyper and hypoglycemia protocol as needed Patient Goals: Monitor and write down blood sugar 3-4 times per day Check blood sugar more often if I feel it is too high or too low or if I feel sick Take medications as prescribed Write down questions for my endocrinologist (diabetes doctor) Keep cholesterol low by eating the right foods Follow Up Plan:  Telephone follow up appointment with care management team member scheduled for:  Tuesday June 22, 2021 The patient has been provided with contact information for the care management team and has been advised to call with any health  related questions or concerns.       Plan: Telephone follow up appointment with care management team member scheduled for:  Tuesday June 22, 2021 The patient has been provided with contact information for the care management team and has been advised to call with any health related questions or concerns  Oneta Rack, RN, BSN, [place] [phone]: direct office [phone]: mobile    Medical screening examination/treatment/procedure(s) were performed by non-physician practitioner and as supervising physician I was immediately available for consultation/collaboration.  I agree with above. Lew Dawes, MD

## 2021-05-28 NOTE — Chronic Care Management (AMB) (Signed)
  Chronic Care Management   Outreach Note  05/28/2021 Name: Katherine Herrera MRN: [id number] DOB: 15-Aug-1966  Referred by: Cassandria Anger, MD Reason for referral : No chief complaint on file.   An unsuccessful telephone outreach was attempted today. The patient was referred to the pharmacist for assistance with care management and care coordination.   Follow Up Plan:   Lauretta Grill Upstream Scheduler

## 2021-06-01 ENCOUNTER — Encounter: Payer: Medicare Other | Admitting: Podiatry

## 2021-06-01 ENCOUNTER — Telehealth: Payer: Self-pay | Admitting: Podiatry

## 2021-06-01 MED ORDER — DOXYCYCLINE HYCLATE 100 MG PO TABS: 100.0000 mg | ORAL_TABLET | Freq: Two times a day (BID) | ORAL | 0 refills | Status: DC

## 2021-06-01 NOTE — Telephone Encounter (Signed)
Pt had a spider bite on her left ankle and its redness around the one that broken. She wanted to know if you can call in an antibiotic.  [facility] [place]

## 2021-06-01 NOTE — Addendum Note (Signed)
Addended by: Hardie Pulley on: 06/01/2021 03:56 PM   Modules accepted: Orders

## 2021-06-04 ENCOUNTER — Other Ambulatory Visit: Payer: Self-pay

## 2021-06-04 ENCOUNTER — Ambulatory Visit (INDEPENDENT_AMBULATORY_CARE_PROVIDER_SITE_OTHER): Payer: Medicare Other | Admitting: Podiatry

## 2021-06-04 DIAGNOSIS — E11621 Type 2 diabetes mellitus with foot ulcer: Secondary | ICD-10-CM | POA: Diagnosis not present

## 2021-06-04 DIAGNOSIS — Z9889 Other specified postprocedural states: Secondary | ICD-10-CM

## 2021-06-04 DIAGNOSIS — M2041 Other hammer toe(s) (acquired), right foot: Secondary | ICD-10-CM

## 2021-06-04 DIAGNOSIS — L97415 Non-pressure chronic ulcer of right heel and midfoot with muscle involvement without evidence of necrosis: Secondary | ICD-10-CM

## 2021-06-04 NOTE — Progress Notes (Signed)
  Subjective:  Patient ID: Katherine Herrera, female    DOB: [birth year],  MRN: [id number]  Chief Complaint  Patient presents with   Post-op Follow-up    POV 2 - Dos 05/12/21 no pain just swelling   DOS: 05/12/21 Procedure: RT FOOT WOUND DEBRIDMENT & IRRIGATION W/POSS SKIN GRAFT SUB, PARTIAL EXCSION OF 5TH METATARSAL BASE BONE, FLEXOR TENOTOMY, ARTHROPLASTY OF 2ND TOE    55 y.o. female presents with the above complaint. History confirmed with patient.   Objective:  Physical Exam: no tenderness at the surgical site, local edema noted, and calf supple, nontender. Incision: healing well at 2nd toe Plantar 5th met wound healing well, 1x1.5 with granular base and epithelial tissue. New 1x0.5 linear wound, likely superficial dehiscence without active warmth, erythema, SOI.  Assessment:   1. Diabetic ulcer of right midfoot associated with type 2 diabetes mellitus, with muscle involvement without evidence of necrosis ([place])   2. Hammer toe of right foot   3. Post-operative state    Plan:  Patient was evaluated and treated and all questions answered.  Post-operative State -2nd toe healing well. Sutures removed. -Staples removed from lateral wound.  Ulcer right plantar foot -Wound scrubbed with chlorhexidine -Skin graft substitute applied to promote healing.  Procedure: Application Skin Graft Substitute Rationale: Wound in need of advanced wound therapy to accelerate healing Pre-Debridement Wound Measurements: 1 cm x 1.5 cm x 0.1 cm  Post-Debridement Wound Measurements: same as pre-debridement. Skin substitute: Puraply AM    Lot #: [id number]     Expiration: 08/18/23 Hydration: graft hydrated with saline Secondary Dressing: mepitel, sterile gauze, kerlix, ABD PAD, and ACE bandage Disposition: Patient tolerated procedure well.      No follow-ups on file.

## 2021-06-10 ENCOUNTER — Other Ambulatory Visit: Payer: Self-pay | Admitting: Internal Medicine

## 2021-06-10 ENCOUNTER — Telehealth: Payer: Self-pay | Admitting: Podiatry

## 2021-06-10 MED ORDER — DOXYCYCLINE HYCLATE 100 MG PO TABS: 100.0000 mg | ORAL_TABLET | Freq: Two times a day (BID) | ORAL | 0 refills | Status: DC

## 2021-06-10 NOTE — Telephone Encounter (Signed)
Patient calling to get refill on doxycyline; stated that redness is returning. [facility] ([address] [place]) while patient is out of town.

## 2021-06-10 NOTE — Addendum Note (Signed)
Addended by: Hardie Pulley on: 06/10/2021 12:04 PM   Modules accepted: Orders

## 2021-06-22 ENCOUNTER — Telehealth: Payer: Medicare Other

## 2021-06-22 ENCOUNTER — Telehealth: Payer: Self-pay | Admitting: *Deleted

## 2021-06-22 ENCOUNTER — Ambulatory Visit (INDEPENDENT_AMBULATORY_CARE_PROVIDER_SITE_OTHER): Payer: Medicare Other | Admitting: Podiatry

## 2021-06-22 ENCOUNTER — Encounter: Payer: Self-pay | Admitting: *Deleted

## 2021-06-22 ENCOUNTER — Other Ambulatory Visit: Payer: Self-pay

## 2021-06-22 DIAGNOSIS — L97415 Non-pressure chronic ulcer of right heel and midfoot with muscle involvement without evidence of necrosis: Secondary | ICD-10-CM | POA: Diagnosis not present

## 2021-06-22 DIAGNOSIS — E11621 Type 2 diabetes mellitus with foot ulcer: Secondary | ICD-10-CM | POA: Diagnosis not present

## 2021-06-22 NOTE — Progress Notes (Signed)
  Subjective:  Patient ID: Katherine Herrera, female    DOB: 03/22/1966,  MRN: [id number]  Chief Complaint  Patient presents with   Wound Check    Wound care    DOS: 05/12/21 Procedure: RT FOOT WOUND DEBRIDMENT & IRRIGATION W/POSS SKIN GRAFT SUB, PARTIAL EXCSION OF 5TH METATARSAL BASE BONE, FLEXOR TENOTOMY, ARTHROPLASTY OF 2ND TOE    55 y.o. female presents with the above complaint. History confirmed with patient. States the foot is doing very well denies new issues with it,  Objective:  Physical Exam: no tenderness at the surgical site, local edema noted, and calf supple, nontender. Incision: healing well at 2nd toe Plantar 5th met wound healing well, 1x0.5 with granular base and epithelial tissue. Moderate HPK around wound. No warmth, erythema, SOI  Assessment:   1. Diabetic ulcer of right midfoot associated with type 2 diabetes mellitus, with muscle involvement without evidence of necrosis [facility])     Plan:  Patient was evaluated and treated and all questions answered.  Post-operative State -2nd toe healing well. Sutures removed. -Staples removed from lateral wound.  Ulcer right plantar foot -Continues to improve. -Puraply graft reapplied to promote full healing  Procedure: Application Skin Graft Substitute Rationale: Wound in need of advanced wound therapy to accelerate healing Pre-Debridement Wound Measurements: 1 cm x 0.5 cm x 0.1 cm  Post-Debridement Wound Measurements: same as pre-debridement. Type of Debridement: Selective Tissue Removed: Devitalized soft-tissue Instrumentation: 3 mm dermal curette Skin substitute: Puraply AM    Lot #: [id number]    Expiration: 08/18/23 Hydration: graft hydrated with saline Secondary Dressing: mepitel, sterile gauze, ABD PAD, and ACE bandage Disposition: Patient tolerated procedure well. Patient to return in 1 week for follow-up.   No follow-ups on file.

## 2021-06-22 NOTE — Telephone Encounter (Signed)
  Chronic Care Management   Follow Up Note   06/22/2021 Name: Katherine Herrera MRN: [id number] DOB: 10/16/1966   Referred by: Cassandria Anger, MD Reason for referral : Chronic Care Management (CCM RN CM Unsuccessful outreach attempt; DMII; HLD)  An unsuccessful telephone outreach was attempted today. The patient was referred to the case management team for assistance with care management and care coordination.   Follow Up Plan:  A HIPPA compliant phone message was left for the patient providing contact information and requesting a return call Will place request with scheduling care guide to contact patient to re-schedule today's missed CCM RN follow up telephone appointment    Oneta Rack, RN, BSN, [place] [phone]: direct office [phone]: mobile

## 2021-06-29 ENCOUNTER — Other Ambulatory Visit: Payer: Self-pay

## 2021-06-29 ENCOUNTER — Ambulatory Visit (INDEPENDENT_AMBULATORY_CARE_PROVIDER_SITE_OTHER): Payer: Medicare Other | Admitting: Podiatry

## 2021-06-29 DIAGNOSIS — E11621 Type 2 diabetes mellitus with foot ulcer: Secondary | ICD-10-CM

## 2021-06-29 DIAGNOSIS — L97415 Non-pressure chronic ulcer of right heel and midfoot with muscle involvement without evidence of necrosis: Secondary | ICD-10-CM

## 2021-06-29 MED ORDER — SILVER SULFADIAZINE 1 % EX CREA: TOPICAL_CREAM | CUTANEOUS | 0 refills | Status: DC

## 2021-06-29 NOTE — Progress Notes (Signed)
  Subjective:  Patient ID: Katherine Herrera, female    DOB: Aug 03, 1966,  MRN: [id number]  Chief Complaint  Patient presents with   Diabetic Ulcer    POV #4 DOS 05/12/2021   Post-op Wound Care - Graft. Pt states she is doing well. Pt requested to not have graft application today.    DOS: 05/12/21 Procedure: RT FOOT WOUND DEBRIDMENT & IRRIGATION W/POSS SKIN GRAFT SUB, PARTIAL EXCSION OF 5TH METATARSAL BASE BONE, FLEXOR TENOTOMY, ARTHROPLASTY OF 2ND TOE    55 y.o. female presents with the above complaint. History confirmed with patient. States the wound is doing better denies new issues with her foot.  Objective:  Physical Exam: no tenderness at the surgical site, local edema noted, and calf supple, nontender. Incision: healing well at 2nd toe Plantar 5th met wound healing well, 0.3x0.2 with granular base and epithelial tissue. Mild HPK around wound. No warmth, erythema, SOI  Assessment:   1. Diabetic ulcer of right midfoot associated with type 2 diabetes mellitus, with muscle involvement without evidence of necrosis [facility])     Plan:  Patient was evaluated and treated and all questions answered.  Ulcer right plantar foot -Continues to improve. Hold wound graft today -Dressed with SSD and foam border dressing -New surgical shoe dispensed today -Ok to shower, patient to continue dressing daily with silvadene and bandage. Refill silvadene   No follow-ups on file.

## 2021-07-06 DIAGNOSIS — E039 Hypothyroidism, unspecified: Secondary | ICD-10-CM | POA: Diagnosis not present

## 2021-07-06 DIAGNOSIS — E1140 Type 2 diabetes mellitus with diabetic neuropathy, unspecified: Secondary | ICD-10-CM | POA: Diagnosis not present

## 2021-07-06 DIAGNOSIS — I10 Essential (primary) hypertension: Secondary | ICD-10-CM | POA: Diagnosis not present

## 2021-07-06 DIAGNOSIS — E7800 Pure hypercholesterolemia, unspecified: Secondary | ICD-10-CM | POA: Diagnosis not present

## 2021-07-06 DIAGNOSIS — N189 Chronic kidney disease, unspecified: Secondary | ICD-10-CM | POA: Diagnosis not present

## 2021-07-06 DIAGNOSIS — E1165 Type 2 diabetes mellitus with hyperglycemia: Secondary | ICD-10-CM | POA: Diagnosis not present

## 2021-07-13 ENCOUNTER — Other Ambulatory Visit: Payer: Self-pay

## 2021-07-13 ENCOUNTER — Ambulatory Visit (INDEPENDENT_AMBULATORY_CARE_PROVIDER_SITE_OTHER): Payer: Medicare Other | Admitting: Podiatry

## 2021-07-13 DIAGNOSIS — E11621 Type 2 diabetes mellitus with foot ulcer: Secondary | ICD-10-CM

## 2021-07-13 DIAGNOSIS — E1169 Type 2 diabetes mellitus with other specified complication: Secondary | ICD-10-CM | POA: Diagnosis not present

## 2021-07-13 DIAGNOSIS — E1142 Type 2 diabetes mellitus with diabetic polyneuropathy: Secondary | ICD-10-CM

## 2021-07-13 DIAGNOSIS — B351 Tinea unguium: Secondary | ICD-10-CM | POA: Diagnosis not present

## 2021-07-13 DIAGNOSIS — L97415 Non-pressure chronic ulcer of right heel and midfoot with muscle involvement without evidence of necrosis: Secondary | ICD-10-CM

## 2021-07-18 ENCOUNTER — Other Ambulatory Visit: Payer: Self-pay | Admitting: Internal Medicine

## 2021-07-19 NOTE — Progress Notes (Signed)
  Subjective:  Patient ID: Katherine Herrera, female    DOB: 03/13/1966,  MRN: [id number]  Chief Complaint  Patient presents with   Diabetic Ulcer      POV #5 DOS 05/12/2021   Post-op Wound Care - Graft   DOS: 05/12/21 Procedure: RT FOOT WOUND DEBRIDMENT & IRRIGATION W/POSS SKIN GRAFT SUB, PARTIAL EXCSION OF 5TH METATARSAL BASE BONE, FLEXOR TENOTOMY, ARTHROPLASTY OF 2ND TOE    55 y.o. female presents with the above complaint. History confirmed with patient.  Denies new issues with the foot thinks that the foot is hurting last and she not having much pain at the ulceration  Objective:  Physical Exam: no tenderness at the surgical site, local edema noted, and calf supple, nontender. Incision: healing well at 2nd toe Plantar 5th met wound healing well, 0.3x0.2 with granular base and epithelial tissue. Mild HPK around wound. No warmth, erythema, SOI  Assessment:   1. Diabetic ulcer of right midfoot associated with type 2 diabetes mellitus, with muscle involvement without evidence of necrosis ([place])   2. Onychomycosis of multiple toenails with type 2 diabetes mellitus and peripheral neuropathy ([place])      Plan:  Patient was evaluated and treated and all questions answered.  Ulcer right plantar foot -Wound almost healed.  Gently debrided.  Dressed with Silvadene and Band-Aid.  At this point I think it is okay for patient to attempt to wear normal shoe gear.  We will also get her appointment for new diabetic shoes.  Onychomycosis diabetes peripheral neuropathy -Nails debrided x10   Procedure: Nail Debridement Type of Debridement: manual, sharp debridement. Instrumentation: Nail nipper, rotary burr. Number of Nails: 10     No follow-ups on file.

## 2021-07-23 ENCOUNTER — Telehealth: Payer: Self-pay

## 2021-07-23 NOTE — Telephone Encounter (Signed)
[employer] called to follow up on faxes that has been received.

## 2021-07-29 ENCOUNTER — Ambulatory Visit: Payer: Medicare Other | Admitting: Internal Medicine

## 2021-07-30 ENCOUNTER — Other Ambulatory Visit: Payer: Self-pay

## 2021-07-30 ENCOUNTER — Ambulatory Visit (INDEPENDENT_AMBULATORY_CARE_PROVIDER_SITE_OTHER): Payer: Medicare Other | Admitting: Podiatry

## 2021-07-30 DIAGNOSIS — L97411 Non-pressure chronic ulcer of right heel and midfoot limited to breakdown of skin: Secondary | ICD-10-CM

## 2021-07-30 DIAGNOSIS — E11621 Type 2 diabetes mellitus with foot ulcer: Secondary | ICD-10-CM | POA: Diagnosis not present

## 2021-08-05 ENCOUNTER — Telehealth: Payer: Self-pay | Admitting: *Deleted

## 2021-08-05 ENCOUNTER — Encounter: Payer: Self-pay | Admitting: *Deleted

## 2021-08-05 ENCOUNTER — Telehealth: Payer: Medicare Other

## 2021-08-05 NOTE — Telephone Encounter (Addendum)
  Chronic Care Management   Follow Up Note   08/05/2021 Name: Katherine Herrera MRN: [id number] DOB: 09/24/1966   Referred by: Cassandria Anger, MD Reason for referral : Chronic Care Management (CCM RN CM Telephone Visit- second unsuccessful attempt)  A second unsuccessful telephone outreach was attempted today. The patient was referred to the case management team for assistance with care management and care coordination.   Follow Up Plan:  A HIPPA compliant phone message was left for the patient providing contact information and requesting a return call Will place request with scheduling care guide to contact patient to re-schedule today's missed CCM RN follow up telephone appointment if I do not hear back form patient later this afternoon  4:45 pm: Did not receive call back from patient- sent re-schedule request to scheduling care guide  Oneta Rack, RN, BSN, [facility] [phone]: direct office [phone]: mobile

## 2021-08-06 ENCOUNTER — Telehealth: Payer: Self-pay | Admitting: *Deleted

## 2021-08-06 ENCOUNTER — Encounter: Payer: Self-pay | Admitting: Internal Medicine

## 2021-08-06 DIAGNOSIS — T466X5A Adverse effect of antihyperlipidemic and antiarteriosclerotic drugs, initial encounter: Secondary | ICD-10-CM | POA: Insufficient documentation

## 2021-08-06 DIAGNOSIS — G720 Drug-induced myopathy: Secondary | ICD-10-CM | POA: Insufficient documentation

## 2021-08-06 NOTE — Chronic Care Management (AMB) (Signed)
  Care Management   Note  08/06/2021 Name: Katherine Herrera MRN: [id number] DOB: 06/10/1966  Katherine Herrera is a 55 y.o. year old female who is a primary care patient of Plotnikov, [name], MD and is actively engaged with the care management team. I reached out to [name] by phone today to assist with re-scheduling a follow up visit with the RN Case Manager  Follow up plan: Unsuccessful telephone outreach attempt made. A HIPAA compliant phone message was left for the patient providing contact information and requesting a return call.  The care management team will reach out to the patient again over the next 7 days.  If patient returns call to provider office, please advise to call [place] at [phone].  [place] Management  Direct Dial: [phone]

## 2021-08-06 NOTE — Progress Notes (Signed)
  Subjective:  Patient ID: Katherine Herrera, female    DOB: 11/08/1965,  MRN: [id number]  Chief Complaint  Patient presents with   Diabetic Ulcer    Follow up right foot diabetic ulcer. Pt states she is doing well. No questions or concerns or new complaints.    DOS: 05/12/21 Procedure: RT FOOT WOUND DEBRIDMENT & IRRIGATION W/POSS SKIN GRAFT SUB, PARTIAL EXCSION OF 5TH METATARSAL BASE BONE, FLEXOR TENOTOMY, ARTHROPLASTY OF 2ND TOE    55 y.o. female presents with the above complaint. History confirmed with patient.   Objective:  Physical Exam: no tenderness at the surgical site, local edema noted, and calf supple, nontender. Incision: healing well at 2nd toe Plantar 5th met wound healing well, 0.1x0.2 with granular base and epithelial tissue. Mild HPK around wound. No warmth, erythema, SOI  Assessment:   1. Diabetic ulcer of right midfoot associated with type 2 diabetes mellitus, limited to breakdown of skin ([place])     Plan:  Patient was evaluated and treated and all questions answered.  Ulcer right plantar foot -Again almost fully healed.  There is a small area of residual ulceration which was minimally debrided today.  Dressed with Silvadene and Band-Aid.  We will get her appointment for fitting of diabetic shoes as I think this will help with any further ulceration.  No other surgical intervention planned at this time.  I think she can fit in shoes with custom shoes that she previously had     No follow-ups on file.

## 2021-08-11 ENCOUNTER — Telehealth: Payer: Self-pay | Admitting: Podiatry

## 2021-08-11 ENCOUNTER — Other Ambulatory Visit: Payer: Self-pay

## 2021-08-11 ENCOUNTER — Encounter: Payer: Self-pay | Admitting: Internal Medicine

## 2021-08-11 ENCOUNTER — Ambulatory Visit (INDEPENDENT_AMBULATORY_CARE_PROVIDER_SITE_OTHER): Payer: Medicare Other | Admitting: Internal Medicine

## 2021-08-11 VITALS — BP 112/60 | HR 78 | Temp 98.3°F | Ht 69.5 in | Wt 218.4 lb

## 2021-08-11 DIAGNOSIS — M7989 Other specified soft tissue disorders: Secondary | ICD-10-CM

## 2021-08-11 DIAGNOSIS — F419 Anxiety disorder, unspecified: Secondary | ICD-10-CM

## 2021-08-11 DIAGNOSIS — G894 Chronic pain syndrome: Secondary | ICD-10-CM

## 2021-08-11 DIAGNOSIS — T466X5A Adverse effect of antihyperlipidemic and antiarteriosclerotic drugs, initial encounter: Secondary | ICD-10-CM | POA: Diagnosis not present

## 2021-08-11 DIAGNOSIS — M5440 Lumbago with sciatica, unspecified side: Secondary | ICD-10-CM | POA: Diagnosis not present

## 2021-08-11 DIAGNOSIS — E1140 Type 2 diabetes mellitus with diabetic neuropathy, unspecified: Secondary | ICD-10-CM

## 2021-08-11 DIAGNOSIS — G720 Drug-induced myopathy: Secondary | ICD-10-CM | POA: Diagnosis not present

## 2021-08-11 DIAGNOSIS — Z23 Encounter for immunization: Secondary | ICD-10-CM

## 2021-08-11 MED ORDER — OXYCODONE HCL 15 MG PO TABS: 15.0000 mg | ORAL_TABLET | Freq: Four times a day (QID) | ORAL | 0 refills | Status: DC | PRN

## 2021-08-11 MED ORDER — CLONAZEPAM 0.5 MG PO TABS: 0.5000 mg | ORAL_TABLET | Freq: Two times a day (BID) | ORAL | 1 refills | Status: DC | PRN

## 2021-08-11 NOTE — Assessment & Plan Note (Signed)
Cont on 70/30 insulin 

## 2021-08-11 NOTE — Assessment & Plan Note (Signed)
Cont on Oxycodone 15 mg  Potential benefits of a long term opioids use as well as potential risks (i.e. addiction risk, apnea etc) and complications (i.e. Somnolence, constipation and others) were explained to the patient and were aknowledged. Risks of use w/benzodiazepines discussed  Do not take w/Clonazepam

## 2021-08-11 NOTE — Assessment & Plan Note (Signed)
Better  

## 2021-08-11 NOTE — Telephone Encounter (Signed)
Pt scheduled tomorrow for diabetic shoes and has gotten custom shoes in the past and the assistant in that dept is not able to do custom shoes. She needs to see the pedorthist and he has nothing available as of now until dec. But that maybe changing. But we need to get her appt canceled for tomorrow.

## 2021-08-11 NOTE — Assessment & Plan Note (Signed)
Statin intolerant.  On Fish oil

## 2021-08-11 NOTE — Assessment & Plan Note (Signed)
On OXY Potential benefits of a long term opioids use as well as potential risks (i.e. addiction risk, apnea etc) and complications (i.e. Somnolence, constipation and others) were explained to the patient and were aknowledged.

## 2021-08-11 NOTE — Assessment & Plan Note (Signed)
Cont on Clonazepam prn  Potential benefits of a long term benzodiazepines  use as well as potential risks  and complications were explained to the patient and were aknowledged. 

## 2021-08-11 NOTE — Progress Notes (Signed)
Subjective:  Patient ID: [name], female    DOB: 03-Sep-1966  Age: 55 y.o. MRN: [id number]  CC: Follow-up (3 month f/u- Flu shot)   HPI TIFFANY CALMES presents for LBP, DM, neuropathy f/u Pt had labs w/her Endo - Dr Chalmers Cater  Outpatient Medications Prior to Visit  Medication Sig Dispense Refill   B-D INS SYR ULTRAFINE 1CC/31G 31G X 5/16" 1 ML MISC USE AS DIRECTED 100 each 0   baclofen (LIORESAL) 10 MG tablet Take 2 tablets (20 mg total) by mouth 2 (two) times daily as needed for muscle spasms. 180 tablet 1   bisacodyl (DULCOLAX) 5 MG EC tablet Take 10 mg by mouth at bedtime.     Blood Glucose Monitoring Suppl (ONETOUCH VERIO) w/Device KIT      cholecalciferol (VITAMIN D3) 25 MCG (1000 UNIT) tablet Take 1,000 Units by mouth at bedtime.     docusate sodium (COLACE) 100 MG capsule Take 100 mg by mouth 2 (two) times daily.     ezetimibe (ZETIA) 10 MG tablet TAKE 1 TABLET BY MOUTH  DAILY (Patient taking differently: Take 10 mg by mouth daily.) 90 tablet 2   gabapentin (NEURONTIN) 800 MG tablet TAKE 1 TABLET BY MOUTH 4  TIMES DAILY 360 tablet 3   glucose blood (ONETOUCH VERIO) test strip Use to check blood sugar 2 times per day. 100 each 12   insulin NPH-regular Human (70-30) 100 UNIT/ML injection Inject 10 Units into the skin 2 (two) times daily with a meal. (Patient taking differently: Inject 35-60 Units into the skin as directed. 50 units in the morning, 35 units at lunch, 60 units at dinner) 10 mL 11   Insulin Syringe-Needle U-100 (SURE COMFORT INSULIN SYRINGE) 31G X 5/16" 0.5 ML MISC USE AS DIRECTED WITH INSULIN 4 TIMES A DAY 400 each 2   lamoTRIgine (LAMICTAL) 25 MG tablet TAKE 1 TABLET BY MOUTH  TWICE DAILY 180 tablet 3   Lancet Devices (ACCU-CHEK SOFTCLIX) lancets Test two times daily 100 each 12   Lancets (ONETOUCH DELICA PLUS [id number]) MISC      levothyroxine (SYNTHROID) 75 MCG tablet TAKE 1 TABLET BY MOUTH  DAILY 90 tablet 3   lisinopril (ZESTRIL) 10 MG tablet TAKE 1 TABLET BY  MOUTH  DAILY (Patient taking differently: Take 10 mg by mouth daily.) 90 tablet 2   loratadine (CLARITIN) 10 MG tablet Take 1 tablet (10 mg total) by mouth daily as needed for allergies. 100 tablet 3   nicotine (NICODERM CQ) 21 mg/24hr patch Place 1 patch (21 mg total) onto the skin daily. Then as dirrected 28 patch 0   ondansetron (ZOFRAN) 4 MG tablet Take 4 mg by mouth every 8 (eight) hours as needed for nausea or vomiting.     rOPINIRole (REQUIP) 0.5 MG tablet TAKE 1 TABLET BY MOUTH AT  BEDTIME 90 tablet 3   senna-docusate (SENOKOT-S) 8.6-50 MG tablet Take 1 tablet by mouth 2 (two) times daily. (Patient taking differently: Take 1 tablet by mouth at bedtime.) 20 tablet 0   silver sulfADIAZINE (SILVADENE) 1 % cream Apply pea-sized amount to wound daily. 50 g 0   vitamin B-12 (CYANOCOBALAMIN) 1000 MCG tablet Take 1,000 mcg by mouth daily.     clonazePAM (KLONOPIN) 0.5 MG tablet Take 1 tablet (0.5 mg total) by mouth 2 (two) times daily as needed for anxiety. 60 tablet 1   oxyCODONE (ROXICODONE) 15 MG immediate release tablet Take 1 tablet (15 mg total) by mouth every 6 (six) hours as  needed. 120 tablet 0   oxyCODONE-acetaminophen (PERCOCET) 10-325 MG tablet Take 1 tablet by mouth every 4 (four) hours as needed for pain. 20 tablet 0   fluticasone (FLONASE) 50 MCG/ACT nasal spray Place 2 sprays into both nostrils daily. (Patient not taking: No sig reported) 16 g 6   aspirin (BAYER ASPIRIN) 325 MG tablet Take 1 tablet (325 mg total) by mouth daily. (Patient not taking: No sig reported) 100 tablet 3   clindamycin (CLEOCIN) 150 MG capsule Take 1 capsule (150 mg total) by mouth 2 (two) times daily. (Patient not taking: Reported on 08/11/2021) 14 capsule 0   doxycycline (VIBRA-TABS) 100 MG tablet Take 1 tablet (100 mg total) by mouth 2 (two) times daily. (Patient not taking: Reported on 08/11/2021) 14 tablet 0   No facility-administered medications prior to visit.    ROS: Review of Systems   Constitutional:  Negative for activity change, appetite change, chills, fatigue and unexpected weight change.  HENT:  Negative for congestion, mouth sores and sinus pressure.   Eyes:  Negative for visual disturbance.  Respiratory:  Negative for cough and chest tightness.   Gastrointestinal:  Negative for abdominal pain and nausea.  Genitourinary:  Negative for difficulty urinating, frequency and vaginal pain.  Musculoskeletal:  Positive for back pain and gait problem.  Skin:  Negative for pallor and rash.  Neurological:  Negative for dizziness, tremors, weakness, numbness and headaches.  Psychiatric/Behavioral:  Negative for confusion, sleep disturbance and suicidal ideas.    Objective:  BP 112/60 (BP Location: Left Arm)   Pulse 78   Temp 98.3 F (36.8 C) (Oral)   Ht 5' 9.5" (1.765 m)   Wt 218 lb 6.4 oz (99.1 kg)   SpO2 95%   BMI 31.79 kg/m   BP Readings from Last 3 Encounters:  08/11/21 112/60  05/12/21 (!) 97/56  05/07/21 118/68    Wt Readings from Last 3 Encounters:  08/11/21 218 lb 6.4 oz (99.1 kg)  05/12/21 218 lb 4.1 oz (99 kg)  04/28/21 218 lb 3.2 oz (99 kg)    Physical Exam Constitutional:      General: She is not in acute distress.    Appearance: She is well-developed. She is obese.  HENT:     Head: Normocephalic.     Right Ear: External ear normal.     Left Ear: External ear normal.     Nose: Nose normal.  Eyes:     General:        Right eye: No discharge.        Left eye: No discharge.     Conjunctiva/sclera: Conjunctivae normal.     Pupils: Pupils are equal, round, and reactive to light.  Neck:     Thyroid: No thyromegaly.     Vascular: No JVD.     Trachea: No tracheal deviation.  Cardiovascular:     Rate and Rhythm: Normal rate and regular rhythm.     Heart sounds: Normal heart sounds.  Pulmonary:     Effort: No respiratory distress.     Breath sounds: No stridor. No wheezing.  Abdominal:     General: Bowel sounds are normal. There is no  distension.     Palpations: Abdomen is soft. There is no mass.     Tenderness: There is no abdominal tenderness. There is no guarding or rebound.  Musculoskeletal:        General: Tenderness present.     Cervical back: Normal range of motion and neck supple. No rigidity.  Lymphadenopathy:     Cervical: No cervical adenopathy.  Skin:    Findings: No erythema or rash.  Neurological:     Mental Status: She is oriented to person, place, and time.     Cranial Nerves: No cranial nerve deficit.     Motor: No abnormal muscle tone.     Coordination: Coordination abnormal.     Deep Tendon Reflexes: Reflexes normal.  Psychiatric:        Behavior: Behavior normal.        Thought Content: Thought content normal.        Judgment: Judgment normal.   Surgical shoes on B feet LS w/pain  Lab Results  Component Value Date   WBC 9.9 05/07/2021   HGB 14.0 05/07/2021   HCT 44.2 05/07/2021   PLT 300 05/07/2021   GLUCOSE 121 (H) 05/07/2021   CHOL 223 (H) 07/19/2016   TRIG (H) 07/19/2016    527.0 Triglyceride is over 400; calculations on Lipids are invalid.   HDL 27.40 (L) 07/19/2016   LDLDIRECT 106.0 07/19/2016   LDLCALC 81 01/02/2013   ALT 16 01/27/2021   AST 16 01/27/2021   NA 138 05/07/2021   K 4.4 05/07/2021   CL 105 05/07/2021   CREATININE 0.95 05/07/2021   BUN 17 05/07/2021   CO2 27 05/07/2021   TSH 1.23 10/21/2020   INR 1.0 07/19/2019   HGBA1C 8.2 (H) 05/07/2021   MICROALBUR 0.9 07/19/2016    No results found.  Assessment & Plan:   Problem List Items Addressed This Visit     Anxiety disorder    Cont on Clonazepam prn  Potential benefits of a long term benzodiazepines  use as well as potential risks  and complications were explained to the patient and were aknowledged.      Chronic pain    On OXY Potential benefits of a long term opioids use as well as potential risks (i.e. addiction risk, apnea etc) and complications (i.e. Somnolence, constipation and others) were  explained to the patient and were aknowledged.      Relevant Medications   clonazePAM (KLONOPIN) 0.5 MG tablet   oxyCODONE (ROXICODONE) 15 MG immediate release tablet   oxyCODONE (ROXICODONE) 15 MG immediate release tablet   oxyCODONE (ROXICODONE) 15 MG immediate release tablet   Leg swelling    Better       LOW BACK PAIN    Cont on Oxycodone 15 mg  Potential benefits of a long term opioids use as well as potential risks (i.e. addiction risk, apnea etc) and complications (i.e. Somnolence, constipation and others) were explained to the patient and were aknowledged. Risks of use w/benzodiazepines discussed  Do not take w/Clonazepam      Relevant Medications   oxyCODONE (ROXICODONE) 15 MG immediate release tablet   oxyCODONE (ROXICODONE) 15 MG immediate release tablet   oxyCODONE (ROXICODONE) 15 MG immediate release tablet   Statin myopathy    Statin intolerant.  On Fish oil      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)    Cont on 70/30 insulin      Other Visit Diagnoses     Needs flu shot    -  Primary         Follow-up: Return in about 3 months (around 11/11/2021) for a follow-up visit.  Walker Kehr, MD

## 2021-08-12 ENCOUNTER — Ambulatory Visit (INDEPENDENT_AMBULATORY_CARE_PROVIDER_SITE_OTHER): Payer: Medicare Other | Admitting: *Deleted

## 2021-08-12 DIAGNOSIS — M2041 Other hammer toe(s) (acquired), right foot: Secondary | ICD-10-CM

## 2021-08-12 DIAGNOSIS — L97411 Non-pressure chronic ulcer of right heel and midfoot limited to breakdown of skin: Secondary | ICD-10-CM

## 2021-08-12 DIAGNOSIS — E11621 Type 2 diabetes mellitus with foot ulcer: Secondary | ICD-10-CM

## 2021-08-12 NOTE — Progress Notes (Signed)
Patient presents to the office today for diabetic shoe and insole measuring.  Patient was measured with brannock device to determine size and width for 1 pair of extra depth shoes and foam casted for 3 pair of insoles.   Documentation of medical necessity will be sent to patient's treating diabetic doctor to verify and sign.   Patient's diabetic provider: Dr. Jacelyn Pi  Shoes and insoles will be ordered at that time and patient will be notified for an appointment for fitting when they arrive.   Shoe size (per patient): 9.5-10   Brannock measurement: RIGHT - 10 D, LEFT - 10 C  Patient shoe selection-   1st choice:   Orthofeet 987  2nd choice:  Apex [id number]  Shoe size ordered: Women's 10 X-Wide

## 2021-08-13 NOTE — Chronic Care Management (AMB) (Signed)
  Care Management   Note  08/13/2021 Name: [name] MRN: [id number] DOB: 03-15-1966  Katherine Herrera is a 54 y.o. year old female who is a primary care patient of Plotnikov, [name], MD and is actively engaged with the care management team. I reached out to Katherine Herrera by phone today to assist with re-scheduling a follow up visit with the RN Case Manager  Follow up plan: Telephone appointment with care management team member scheduled for:08/30/21  [facility] Management  Direct Dial: [phone]

## 2021-08-13 NOTE — Chronic Care Management (AMB) (Signed)
  Care Management   Note  08/13/2021 Name: [name] MRN: [id number] DOB: Jul 12, 1966  Katherine Herrera is a 55 y.o. year old female who is a primary care patient of Plotnikov, [name], MD and is actively engaged with the care management team. I reached out to [name] by phone today to assist with re-scheduling a follow up visit with the RN Case Manager  Follow up plan: A second unsuccessful telephone outreach attempt made. A HIPAA compliant phone message was left for the patient providing contact information and requesting a return call. The care management team will reach out to the patient again over the next 7 days. If patient returns call to provider office, please advise to call Alex at [phone].  [facility] Management  Direct Dial: [phone]

## 2021-08-24 ENCOUNTER — Ambulatory Visit (INDEPENDENT_AMBULATORY_CARE_PROVIDER_SITE_OTHER): Payer: Medicare Other | Admitting: Podiatry

## 2021-08-24 DIAGNOSIS — L97411 Non-pressure chronic ulcer of right heel and midfoot limited to breakdown of skin: Secondary | ICD-10-CM | POA: Diagnosis not present

## 2021-08-24 DIAGNOSIS — E11621 Type 2 diabetes mellitus with foot ulcer: Secondary | ICD-10-CM | POA: Diagnosis not present

## 2021-08-24 MED ORDER — DOXYCYCLINE HYCLATE 100 MG PO TABS: 100.0000 mg | ORAL_TABLET | Freq: Two times a day (BID) | ORAL | 0 refills | Status: DC

## 2021-08-24 NOTE — Progress Notes (Signed)
  Subjective:  Patient ID: Katherine Herrera, female    DOB: 1966-10-02,  MRN: [id number]  No chief complaint on file.  DOS: 05/12/21 Procedure: RT FOOT WOUND DEBRIDMENT & IRRIGATION W/POSS SKIN GRAFT SUB, PARTIAL EXCSION OF 5TH METATARSAL BASE BONE, FLEXOR TENOTOMY, ARTHROPLASTY OF 2ND TOE    55 y.o. female presents with the above complaint. History confirmed with patient.   Objective:  Physical Exam: no tenderness at the surgical site, local edema noted, and calf supple, nontender. Incision: healed wound 2nd toe  Plantar 5th met wound with acute worsening - measuring 1.5x0.5 with granular base. Moderate HPK around wound, mild warmth. No probe to bone, no exposed bone Assessment:   1. Diabetic ulcer of right midfoot associated with type 2 diabetes mellitus, limited to breakdown of skin ([place])    Plan:  Patient was evaluated and treated and all questions answered.  Ulcer right plantar foot -Wound with significant interval worsening. Moderate drainage per patient but no active drainage today. No fluid to culture today. Superficial wound specimen unlikely to be of much utility. Rx abx as ppx.  -Wound debrided as below -Dressed with silvadene and sterile foam border dressing. -New surgical shoe dispensed as previous appears worn. Consider PegAssist next visit -F/u in 2 weeks.   No follow-ups on file.

## 2021-08-30 ENCOUNTER — Ambulatory Visit (INDEPENDENT_AMBULATORY_CARE_PROVIDER_SITE_OTHER): Payer: Medicare Other | Admitting: *Deleted

## 2021-08-30 DIAGNOSIS — E1140 Type 2 diabetes mellitus with diabetic neuropathy, unspecified: Secondary | ICD-10-CM | POA: Diagnosis not present

## 2021-08-30 DIAGNOSIS — F17200 Nicotine dependence, unspecified, uncomplicated: Secondary | ICD-10-CM

## 2021-08-30 DIAGNOSIS — E785 Hyperlipidemia, unspecified: Secondary | ICD-10-CM | POA: Diagnosis not present

## 2021-08-30 NOTE — Patient Instructions (Signed)
Visit Information  Katherine Herrera, "Katherine Herrera" it was nice talking with you today.   Please read over the attached information, and start now to re-start checking your blood sugars at home: we will (talk about/ review) these during each of our phone call appointments   I look forward to talking to you again for a telephone update on Monday, September 13, 2021 at 9:00 am- please be listening out for my call that day.  I will call as close to 9:00 am as possible.   If you need to cancel or re-schedule our telephone visit, please call [phone] and one of our care guides will be happy to assist you.   I look forward to hearing about your progress.   Please don't hesitate to contact me if I can be of assistance to you before our next scheduled telephone appointment.   Oneta Rack, RN, BSN, [facility] RN Care Coordination- [place] [phone]: direct office [phone]: mobile   PATIENT GOALS:  Goals Addressed             This Visit's Progress    Monitor and Manage My Blood Sugar-Diabetes Type 2   Not on track    Timeframe:  Long-Range Goal Priority:  Medium Start Date:           05/27/21                  Expected End Date:        05/27/22               Follow Up Date 09/13/21    Monitor and write down blood sugar 3-4 times per day: first thing in the morning before you eat and then again 2 hours after a meal, and at bedtime Write these blood sugar values at home down so we can review them together during our next phone call Check blood sugar more often if I feel it is too high or too low or if I feel sick Take medications as prescribed Write down questions for my endocrinologist (diabetes doctor) and talk to her about about medications like trulicity and ozempic to see if these would be good medications for you to try Keep cholesterol low by eating the right foods Continue taking care of your foot wound and attending podiatry (foot doctor) office  visits   Why is this important?   Checking your blood sugar at home helps to keep it from getting very high or very low.  Writing the results in a diary or log helps the doctor know how to care for you.  Your blood sugar log should have the time, date and the results.  Also, write down the amount of insulin or other medicine that you take.  Other information, like what you ate, exercise done and how you were feeling, will also be helpful.            Preventing Diabetes Mellitus Complications You can help to prevent or slow down problems that are caused by diabetes (diabetes mellitus). Following your diabetes plan and taking care of yourself can reduce your risk of serious or life-threatening complications. What actions can I take to prevent diabetes complications? Diabetes management  Follow instructions from your health care providers about managing your diabetes. Your diabetes may be managed by a team of health care providers who can teach you how to care for yourself and can answer questions that you have. Educate yourself about your condition so you  can make healthy choices about eating and physical activity. Know your target range for your blood sugar (glucose), and check your blood glucose level as often as told. Your health care provider will help you decide how often to check your blood glucose level depending on your treatment goals and how well you are meeting them. Ask your health care provider if you should take low-dose aspirin daily and what dose is recommended for you. Taking low-dose aspirin daily is recommended to help prevent cardiovascular disease. Controlling your blood pressure and cholesterol Your personal target blood pressure is determined based on: Your age. Your medicines. How long you have had diabetes. Any other medical conditions you have. To control your blood pressure: Follow instructions from your health care provider about meal planning, exercise, and  medicines. Make sure your health care provider checks your blood pressure at every medical visit. Monitor your blood pressure at home as told by your health care provider. To control your cholesterol: Follow instructions from your health care provider about meal planning, exercise, and medicines. Have your cholesterol checked at least once a year. You may be prescribed medicine to lower cholesterol (statin). If you are not taking a statin, ask your health care provider if you should be. Controlling your cholesterol may: Help prevent heart disease and stroke. These are the most common health problems for people with diabetes. Improve your blood flow.  Medical appointments and vaccines Schedule and keep yearly physical exams and eye exams. Your health care provider will tell you how often you need medical visits depending on your diabetes management plan. Keep all follow-up visits as told. This is important so possible problems can be identified early and complications can be avoided or treated. Every visit with your health care provider should include measuring your: Weight. Blood pressure. Blood glucose control. Your A1C (hemoglobin A1C) level should be checked: At least 2 times a year, if you are meeting your treatment goals. 4 times a year, if you are not meeting treatment goals or if your treatment goals have changed. Your blood lipids (lipid profile) should be checked yearly. You should also be checked yearly for protein in your urine (urine microalbumin). If you have type 1 diabetes, get an eye exam 3-5 years after you are diagnosed, and then once a year after your first exam. If you have type 2 diabetes, get an eye exam as soon as you are diagnosed, and then once a year after your first exam. It is also important to keep your vaccines current. It is recommended that you receive: A flu (influenza) vaccine every year. A pneumonia (pneumococcal) vaccine and a hepatitis B vaccine. If you  are age 43 or older, you may get the pneumonia vaccine as a series of two separate shots. Ask your health care provider which other vaccines may be recommended. Lifestyle Do not use any products that contain nicotine or tobacco, such as cigarettes, e-cigarettes, and chewing tobacco. If you need help quitting, ask your health care provider. By avoiding nicotine and tobacco: You will lower your risk for heart attack, stroke, nerve disease, and kidney disease. Your cholesterol and blood pressure may improve. Your blood circulation will improve. If you drink alcohol: Limit how much you use to: 0-1 drink a day for women who are not pregnant. 0-2 drinks a day for men. Be aware of how much alcohol is in your drink. In the [place] one drink equals one 12 oz bottle of beer (355 mL), one 5 oz glass of wine (  148 mL), or one 11?2 oz glass of hard liquor (44 mL). Taking care of your feet Diabetes may cause you to have poor blood circulation to your legs and feet. Because of this, taking care of your feet is very important. Diabetes can cause: The skin on the feet to get thinner, break more easily, and heal more slowly. Nerve damage in your legs and feet, which results in decreased feeling. You may not notice minor injuries that could lead to serious problems. To avoid foot problems: Check your skin and feet every day for cuts, bruises, redness, blisters, or sores. Schedule a foot exam with your health care provider once every year. This exam includes: Inspecting the structure and skin of your feet. Checking the pulses and sensation in your feet. Make sure that your health care provider performs a visual foot exam at every medical visit.  Taking care of your teeth People with poorly controlled diabetes are more likely to have gum (periodontal) disease. Diabetes can make periodontal diseases harder to control. If not treated, periodontal diseases can lead to tooth loss. To prevent this: Brush your teeth  twice a day. Floss at least once a day. Visit your dentist 2 times a year. Managing stress Living with diabetes can be stressful. When you are experiencing stress, your blood glucose may be affected in two ways: Stress hormones may cause your blood glucose to rise. You may be distracted from taking good care of yourself. Be aware of your stress level and make changes to help you manage challenging situations. To lower your stress levels: Consider joining a support group. Do planned relaxation or meditation. Do a hobby that you enjoy. Maintain healthy relationships. Exercise regularly. Work with your health care provider or a mental health professional. Where to find more information American Diabetes Association: www.diabetes.org Association of Diabetes Care and Education Specialists: www.diabeteseducator.org Summary You can take action to prevent or slow down problems that are caused by diabetes (diabetes mellitus). Following your diabetes plan and taking care of yourself can reduce your risk of serious or life-threatening complications. Follow instructions from your health care providers about managing your diabetes. Your diabetes may be managed by a team of health care providers who can teach you how to care for yourself and can answer questions that you have. Know your target range for your blood sugar (glucose), and check your blood glucose levels as often as told. Your health care provider will help you decide how often you should check your blood glucose level depending on your treatment goals and how well you are meeting them. Your health care provider will tell you how often you need medical visits depending on your diabetes management plan. Keep all follow-up visits as directed. This is important so possible problems can be identified early and complications can be avoided or treated. This information is not intended to replace advice given to you by your health care provider. Make  sure you discuss any questions you have with your health care provider. Document Revised: 12/06/2019 Document Reviewed: 12/06/2019 Elsevier Patient Education  [place].  Cholesterol Content in Foods Cholesterol is a waxy, fat-like substance that helps to carry fat in the blood. The body needs cholesterol in small amounts, but too much cholesterol can cause damage to the arteries and heart. Most people should eat less than 200 milligrams (mg) of cholesterol a day. Foods with cholesterol Cholesterol is found in animal-based foods, such as meat, seafood, and dairy. Generally, low-fat dairy and lean meats have  less cholesterol than full-fat dairy and fatty meats. The milligrams of cholesterol per serving (mg per serving) of common cholesterol-containing foods are listed below. Meat and other proteins Egg -- one large whole egg has 186 mg. Veal shank -- 4 oz has 141 mg. Lean ground [place] (93% lean) -- 4 oz has 118 mg. Fat-trimmed lamb loin -- 4 oz has 106 mg. Lean ground beef (90% lean) -- 4 oz has 100 mg. Lobster -- 3.5 oz has 90 mg. Pork loin chops -- 4 oz has 86 mg. Canned salmon -- 3.5 oz has 83 mg. Fat-trimmed beef top loin -- 4 oz has 78 mg. Frankfurter -- 1 frank (3.5 oz) has 77 mg. Crab -- 3.5 oz has 71 mg. Roasted chicken without skin, white meat -- 4 oz has 66 mg. Light bologna -- 2 oz has 45 mg. Deli-cut [place] -- 2 oz has 31 mg. Canned tuna -- 3.5 oz has 31 mg. Berniece Salines -- 1 oz has 29 mg. Oysters and mussels (raw) -- 3.5 oz has 25 mg. Mackerel -- 1 oz has 22 mg. Trout -- 1 oz has 20 mg. Pork sausage -- 1 link (1 oz) has 17 mg. Salmon -- 1 oz has 16 mg. Tilapia -- 1 oz has 14 mg. Dairy Soft-serve ice cream --  cup (4 oz) has 103 mg. Whole-milk yogurt -- 1 cup (8 oz) has 29 mg. Cheddar cheese -- 1 oz has 28 mg. American cheese -- 1 oz has 28 mg. Whole milk -- 1 cup (8 oz) has 23 mg. 2% milk -- 1 cup (8 oz) has 18 mg. Cream cheese -- 1 tablespoon (Tbsp) has 15  mg. Cottage cheese --  cup (4 oz) has 14 mg. Low-fat (1%) milk -- 1 cup (8 oz) has 10 mg. Sour cream -- 1 Tbsp has 8.5 mg. Low-fat yogurt -- 1 cup (8 oz) has 8 mg. Nonfat Greek yogurt -- 1 cup (8 oz) has 7 mg. Half-and-half cream -- 1 Tbsp has 5 mg. Fats and oils Cod liver oil -- 1 tablespoon (Tbsp) has 82 mg. Butter -- 1 Tbsp has 15 mg. Lard -- 1 Tbsp has 14 mg. Bacon grease -- 1 Tbsp has 14 mg. Mayonnaise -- 1 Tbsp has 5-10 mg. Margarine -- 1 Tbsp has 3-10 mg. Exact amounts of cholesterol in these foods may vary depending on specific ingredients and brands. Foods without cholesterol Most plant-based foods do not have cholesterol unless you combine them with a food that has cholesterol. Foods without cholesterol include: Grains and cereals. Vegetables. Fruits. Vegetable oils, such as olive, canola, and sunflower oil. Legumes, such as peas, beans, and lentils. Nuts and seeds. Egg whites. Summary The body needs cholesterol in small amounts, but too much cholesterol can cause damage to the arteries and heart. Most people should eat less than 200 milligrams (mg) of cholesterol a day. This information is not intended to replace advice given to you by your health care provider. Make sure you discuss any questions you have with your health care provider. Document Revised: 01/28/2020 Document Reviewed: 03/09/2020 Elsevier Patient Education  2022 [place].  Patient verbalizes understanding of instructions provided today and agrees to view in MyChart Telephone follow up appointment with care management team member scheduled for:  Monday, September 13, 2021 at 9:00 am The patient has been provided with contact information for the care management team and has been advised to call with any health related questions or concerns  Oneta Rack, RN, BSN, [place]  [facility] [phone]: direct office [phone]: mobile

## 2021-08-30 NOTE — Chronic Care Management (AMB) (Addendum)
Chronic Care Management   CCM RN Visit Note  08/30/2021 Name: Katherine Herrera MRN: [id number] DOB: 02-27-1966  Subjective: Katherine Herrera is a 54 y.o. year old female who is a primary care patient of Plotnikov, [name], MD. The care management team was consulted for assistance with disease management and care coordination needs.    Engaged with patient by telephone for follow up visit in response to provider referral for case management and/or care coordination services.   Consent to Services:  The patient was given information about Chronic Care Management services, agreed to services, and gave verbal consent prior to initiation of services.  Please see initial visit note for detailed documentation.  Patient agreed to services and verbal consent obtained.   Assessment: Review of patient past medical history, allergies, medications, health status, including review of consultants reports, laboratory and other test data, was performed as part of comprehensive evaluation and provision of chronic care management services.   CCM Care Plan Allergies  Allergen Reactions   Cephalexin Nausea And Vomiting   Demerol  [Meperidine Hcl] Rash   Lovastatin Other (See Comments)    Possible myalgia    Metformin And Related Nausea And Vomiting   Sulfamethoxazole-Trimethoprim     Other reaction(s): Unknown DILI, pancreatitis   Crestor [Rosuvastatin Calcium]     myalgia   Niacin Other (See Comments)    Unknown    Other Other (See Comments)   Hydromorphone Hcl Itching    Patient has been tolerating Hydromorphone tablets without adverse effect (07/09/19) Other reaction(s): Unknown   Paroxetine Other (See Comments)    Unknown    Outpatient Encounter Medications as of 08/30/2021  Medication Sig Note   insulin NPH-regular Human (70-30) 100 UNIT/ML injection Inject 10 Units into the skin 2 (two) times daily with a meal. (Patient taking differently: Inject 35-60 Units into the skin as directed. 50  units in the morning, 35 units at lunch, 60 units at dinner) 08/30/2021: 08/30/21: Reports currently taking 60 U q am; 45 Units at lunch; 70 Units at dinner    B-D INS SYR ULTRAFINE 1CC/31G 31G X 5/16" 1 ML MISC USE AS DIRECTED    baclofen (LIORESAL) 10 MG tablet Take 2 tablets (20 mg total) by mouth 2 (two) times daily as needed for muscle spasms.    bisacodyl (DULCOLAX) 5 MG EC tablet Take 10 mg by mouth at bedtime.    Blood Glucose Monitoring Suppl (ONETOUCH VERIO) w/Device KIT     cholecalciferol (VITAMIN D3) 25 MCG (1000 UNIT) tablet Take 1,000 Units by mouth at bedtime.    clonazePAM (KLONOPIN) 0.5 MG tablet Take 1 tablet (0.5 mg total) by mouth 2 (two) times daily as needed for anxiety.    docusate sodium (COLACE) 100 MG capsule Take 100 mg by mouth 2 (two) times daily.    doxycycline (VIBRA-TABS) 100 MG tablet Take 1 tablet (100 mg total) by mouth 2 (two) times daily.    ezetimibe (ZETIA) 10 MG tablet TAKE 1 TABLET BY MOUTH  DAILY (Patient taking differently: Take 10 mg by mouth daily.)    fluticasone (FLONASE) 50 MCG/ACT nasal spray Place 2 sprays into both nostrils daily. (Patient not taking: No sig reported)    gabapentin (NEURONTIN) 800 MG tablet TAKE 1 TABLET BY MOUTH 4  TIMES DAILY    glucose blood (ONETOUCH VERIO) test strip Use to check blood sugar 2 times per day.    Insulin Syringe-Needle U-100 (SURE COMFORT INSULIN SYRINGE) 31G X 5/16" 0.5 ML MISC  USE AS DIRECTED WITH INSULIN 4 TIMES A DAY    lamoTRIgine (LAMICTAL) 25 MG tablet TAKE 1 TABLET BY MOUTH  TWICE DAILY    Lancet Devices (ACCU-CHEK SOFTCLIX) lancets Test two times daily    Lancets (ONETOUCH DELICA PLUS [id number]) MISC     levothyroxine (SYNTHROID) 75 MCG tablet TAKE 1 TABLET BY MOUTH  DAILY    lisinopril (ZESTRIL) 10 MG tablet TAKE 1 TABLET BY MOUTH  DAILY (Patient taking differently: Take 10 mg by mouth daily.)    loratadine (CLARITIN) 10 MG tablet Take 1 tablet (10 mg total) by mouth daily as needed for allergies.     nicotine (NICODERM CQ) 21 mg/24hr patch Place 1 patch (21 mg total) onto the skin daily. Then as dirrected    ondansetron (ZOFRAN) 4 MG tablet Take 4 mg by mouth every 8 (eight) hours as needed for nausea or vomiting.    oxyCODONE (ROXICODONE) 15 MG immediate release tablet Take 1 tablet (15 mg total) by mouth every 6 (six) hours as needed.    oxyCODONE (ROXICODONE) 15 MG immediate release tablet Take 1 tablet (15 mg total) by mouth every 6 (six) hours as needed.    oxyCODONE (ROXICODONE) 15 MG immediate release tablet Take 1 tablet (15 mg total) by mouth every 6 (six) hours as needed.    rOPINIRole (REQUIP) 0.5 MG tablet TAKE 1 TABLET BY MOUTH AT  BEDTIME    senna-docusate (SENOKOT-S) 8.6-50 MG tablet Take 1 tablet by mouth 2 (two) times daily. (Patient taking differently: Take 1 tablet by mouth at bedtime.)    silver sulfADIAZINE (SILVADENE) 1 % cream Apply pea-sized amount to wound daily.    vitamin B-12 (CYANOCOBALAMIN) 1000 MCG tablet Take 1,000 mcg by mouth daily.    No facility-administered encounter medications on file as of 08/30/2021.   Patient Active Problem List   Diagnosis Date Noted   Statin myopathy 08/06/2021   Essential hypertension 10/09/2020   Pure hypercholesterolemia 10/09/2020   Type 2 diabetes mellitus with hyperglycemia ([place]) 10/09/2020   Chronic osteomyelitis involving right ankle and foot ([place])    Cellulitis of right lower leg 01/10/2020   Cellulitis 01/02/2020   Skin cyanosis 01/02/2020   Soft tissue infection 01/01/2020   Septic shock ([place]) 12/31/2019   Peroneal tendonitis, right    Right foot ulcer ([place]) 07/04/2019   Capsulitis of metatarsophalangeal (MTP) joint of right foot    Exostosis of bone of foot    Preop exam for internal medicine 12/19/2018   Polyneuropathy due to type 2 diabetes mellitus ([place]) 10/01/2018   Mild renal insufficiency 09/29/2018   Hyponatremia 09/29/2018   Chronic pain 09/29/2018   Cellulitis of left lower leg    Diabetic foot  ulcer ([place])    Sepsis due to cellulitis ([place]) 09/28/2018   Syncope 07/30/2018   Earache on right 08/02/2017   Cerumen impaction 08/02/2017   Constipation due to pain medication 05/22/2017   Elevated liver enzymes 05/22/2017   AKI (acute kidney injury) ([place]) 05/03/2017   Charcot foot due to diabetes mellitus ([place]) 04/23/2017   Leg swelling 10/28/2016   Knee contusion 01/13/2016   Pain from implanted hardware 01/06/2016   Cellulitis and abscess of toe of right foot 07/24/2015   Nausea without vomiting 07/24/2015   Leg edema, left 06/19/2015   DOE (dyspnea on exertion) 06/16/2015   Chest pain, atypical 06/16/2015   Family history of musculoskeletal disease 03/12/2015   Non-compliant behavior 12/24/2014   Ulcer of right foot limited to breakdown of skin (  [place]) 09/02/2014   Ulcer of foot due to diabetes mellitus ([place]) 08/12/2014   Ulcer of other part of foot 06/20/2014   Leg pain, left 03/31/2014   Callus of foot 03/25/2014   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   Rash 01/28/2014   Pain in lower limb 01/24/2014   Keratosis 01/24/2014   Leg cramps 10/09/2013   Vaginitis and vulvovaginitis 10/09/2013   Ingrown nail 09/30/2013   Pain in toe of left foot 09/30/2013   Tenosynovitis of foot and ankle 09/03/2013   Metatarsal deformity, left 09/03/2013   Pain in joint, ankle and foot 01/22/2013   Pes cavus 10/13/2012   Acquired tight Achilles tendon 10/13/2012   Paronychia 06/14/2012   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 ([place]) 03/19/2012   Allergic rhinitis 03/19/2012   URI (upper respiratory infection) 08/30/2011   Myalgia 04/18/2011   SHOULDER PAIN 01/17/2011   TTS (tarsal tunnel syndrome)    GASTROENTERITIS 10/18/2010   ARTHRALGIA 01/04/2010   WEIGHT GAIN, ABNORMAL 01/04/2010   TACHYCARDIA 10/02/2009   CBC, ABNORMAL 05/01/2009   B12 deficiency 01/05/2009   GOUT 01/05/2009   Edema 01/05/2009   Anxiety disorder 07/28/2008   ELBOW PAIN 07/28/2008   TOBACCO USE  DISORDER/SMOKER-SMOKING CESSATION DISCUSSED 04/21/2008   FOOT PAIN 04/21/2008   PARESTHESIA 04/21/2008   Abdominal pain 04/21/2008   SINUSITIS- ACUTE-NOS 11/13/2007   COPD exacerbation ([place]) 11/13/2007   Diabetic neuropathy ([place]) 08/27/2007   LOW BACK PAIN 08/27/2007   Dyslipidemia 08/20/2007   Hypothyroidism 05/22/2007   Conditions to be addressed/monitored:  HLD and DMII  Care Plan : Diabetes Type 2 (Adult)  Updates made by Knox Royalty, RN since 08/30/2021 12:00 AM     Problem: Glycemic Management (Diabetes, Type 2)   Priority: Medium     Long-Range Goal: Glycemic Management Optimized   Start Date: 05/27/2021  Expected End Date: 05/27/2022  This Visit's Progress: Not on track  Recent Progress: On track  Priority: High  Note:   Objective:  Lab Results  Component Value Date   HGBA1C 8.2 (H) 05/07/2021   Lab Results  Component Value Date   CREATININE 0.95 05/07/2021   CREATININE 1.17 (H) 01/27/2021   CREATININE 1.04 10/21/2020   No results found for: EGFR Current Barriers:  Knowledge Deficits related to basic Diabetes pathophysiology and self care/management- could benefit from ongoing reinforcement and support Occasionally forgets insulin- midday dose (when family visiting) Lack of motivation to adherence to established plan of care  08/30/21: patient reports she has not been monitoring/ recording blood sugars at home: unclear reasons: "I just haven't been" 08/30/21: worsening diabetic ulceration wound to (R) foot per podiatry provider- patient placed on antibiotics 08/24/21 Case Manager Clinical Goal(s):  Over the next 12 months, patient will demonstrate ongoing adherence to prescribed treatment plan for diabetes self care/management as evidenced by patient reporting during CCM RN CM outreach of:  Daily monitoring and recording of CBG 2-4 times/ day Adherence to ADA/ carb modified, low sugar diet  Adherence to prescribed medication regimen  Contacting care  providers for new or worsened symptoms, concerns or questions Interventions:  Collaboration with Plotnikov, [name], MD regarding development and update of comprehensive plan of care as evidenced by provider attestation and co-signature Inter-disciplinary care team collaboration (see longitudinal plan of care Review of patient status, including review of consultants reports, relevant laboratory and other test results, and medications completed Chart reviewed including relevant office notes, upcoming scheduled appointments, and lab results, recent surgical procedure on (R) foot- secondary to  diabetic ulcer per notes Discussed current  clinical condition with patient and confirmed no current clinical concerns; reviewed recent podiatry provider office visit: worsening (R) foot wound- placed on antibiotics- reports taking as prescribed, using silvadene at home as prescribed Attempted to review recent blood sugars at home with patient- however, today she reports she has not been monitoring/ recording blood sugars at home; unclear reasons: "I just haven't been;" encouraged patient again to please begin monitoring and writing down on paper blood sugars at home: discussed timing of blood sugar monitoring, encouraged her to begin at least monitoring at home twice a day Reiterated previously provided education around complications of DMII in setting of cardiovascular co morbidities/ HLD: explained to patient risks/ potential complications of ongoing high blood sugars Reinforced previously provided education around A1-C goals/ reviewed personal historical values along with correlation to blood sugars at home; specific goals for patient discussed: A1-C < 7.0; blood sugars at home at 130 consistently Confirmed patient continues following essentially "regular" diet: discussed importance of following low cholesterol, DM carb-modified, low sugar diet; confirmed she received previously mailed Living Well with Diabetes  booklet, has not been reviewing Confirmed patient continues to administer insulin independently: doses updated in EHR according to patient report Confirmed patient has lost printed educational information around low cholesterol diet: she requests re-sending through [facility]- I will do Attempted to review timing of next scheduled endocrinology provider appointment: she is not sure; again discussed value of making list of questions to take to next endocrinology office visit- including timing of blood sugar monitoring and possibility of starting trulicity/ ozempic Reviewed upcoming provider appointments with patient and confirmed that patient has plans to attend all as scheduled:  podiatry office visit 09/07/21 Discussed plans with patient for ongoing care management follow up and provided patient with direct contact information for care management team Self-Care Activities Self administers insulin as prescribed- but occasionally forgets mid-day dose Attends all scheduled provider appointments Checks blood sugars as prescribed and utilize hyper and hypoglycemia protocol as needed: 08/30/21: patient reports she has not been monitoring/ recording blood sugars at home: unclear reasons: "I just haven't been" Patient Goals: Monitor and write down blood sugar 3-4 times per day: first thing in the morning before you eat and then again 2 hours after a meal, and at bedtime Write these blood sugar values at home down so we can review them together during our next phone call Check blood sugar more often if I feel it is too high or too low or if I feel sick Take medications as prescribed Write down questions for my endocrinologist (diabetes doctor) and talk to her about about medications like trulicity and ozempic to see if these would be good medications for you to try Keep cholesterol low by eating the right foods Continue taking care of your foot wound and attending podiatry (foot doctor) office visits Follow  Up Plan:  Telephone follow up appointment with care management team member scheduled for:  Monday September 13, 2021 at 9:00 am The patient has been provided with contact information for the care management team and has been advised to call with any health related questions or concerns    Plan: Telephone follow up appointment with care management team member scheduled for:  Monday, September 13, 2021 at 9:00 am The patient has been provided with contact information for the care management team and has been advised to call with any health related questions or concerns  Oneta Rack, RN, BSN, [employer]  RN [place] [phone]: direct office [phone]: mobile    Medical screening examination/treatment/procedure(s) were performed by non-physician practitioner and as supervising physician I was immediately available for consultation/collaboration.  I agree with above. Lew Dawes, MD

## 2021-09-03 ENCOUNTER — Other Ambulatory Visit: Payer: Self-pay | Admitting: Internal Medicine

## 2021-09-07 ENCOUNTER — Encounter: Payer: Medicare Other | Admitting: Podiatry

## 2021-09-09 ENCOUNTER — Encounter: Payer: Self-pay | Admitting: Internal Medicine

## 2021-09-09 ENCOUNTER — Ambulatory Visit (INDEPENDENT_AMBULATORY_CARE_PROVIDER_SITE_OTHER): Payer: Medicare Other | Admitting: Internal Medicine

## 2021-09-09 ENCOUNTER — Other Ambulatory Visit: Payer: Self-pay

## 2021-09-09 DIAGNOSIS — E1140 Type 2 diabetes mellitus with diabetic neuropathy, unspecified: Secondary | ICD-10-CM | POA: Diagnosis not present

## 2021-09-09 DIAGNOSIS — M5440 Lumbago with sciatica, unspecified side: Secondary | ICD-10-CM

## 2021-09-09 DIAGNOSIS — E041 Nontoxic single thyroid nodule: Secondary | ICD-10-CM | POA: Diagnosis not present

## 2021-09-09 LAB — TSH: TSH: 0.99 u[IU]/mL (ref 0.35–5.50)

## 2021-09-09 NOTE — Progress Notes (Signed)
Subjective:  Patient ID: Katherine Herrera, female    DOB: [birth year]  Age: 55 y.o. MRN: [id number]  CC: Adenopathy (Lymph nodes swollen (L) side)   HPI Katherine Herrera presents for a possible nodule on the neck detected at the home visit by [facility] RN - ?right side F/u DM - on insulin, LBP - taking pain meds  Outpatient Medications Prior to Visit  Medication Sig Dispense Refill   B-D INS SYR ULTRAFINE 1CC/31G 31G X 5/16" 1 ML MISC USE AS DIRECTED 100 each 0   baclofen (LIORESAL) 10 MG tablet Take 2 tablets (20 mg total) by mouth 2 (two) times daily as needed for muscle spasms. 180 tablet 1   bisacodyl (DULCOLAX) 5 MG EC tablet Take 10 mg by mouth at bedtime.     Blood Glucose Monitoring Suppl (ONETOUCH VERIO) w/Device KIT      cholecalciferol (VITAMIN D3) 25 MCG (1000 UNIT) tablet Take 1,000 Units by mouth at bedtime.     clonazePAM (KLONOPIN) 0.5 MG tablet Take 1 tablet (0.5 mg total) by mouth 2 (two) times daily as needed for anxiety. 60 tablet 1   docusate sodium (COLACE) 100 MG capsule Take 100 mg by mouth 2 (two) times daily.     doxycycline (VIBRA-TABS) 100 MG tablet Take 1 tablet (100 mg total) by mouth 2 (two) times daily. 14 tablet 0   ezetimibe (ZETIA) 10 MG tablet TAKE 1 TABLET BY MOUTH  DAILY 90 tablet 3   fluticasone (FLONASE) 50 MCG/ACT nasal spray Place 2 sprays into both nostrils daily. 16 g 6   gabapentin (NEURONTIN) 800 MG tablet TAKE 1 TABLET BY MOUTH 4  TIMES DAILY 360 tablet 3   glucose blood (ONETOUCH VERIO) test strip Use to check blood sugar 2 times per day. 100 each 12   insulin NPH-regular Human (70-30) 100 UNIT/ML injection Inject 10 Units into the skin 2 (two) times daily with a meal. (Patient taking differently: Inject 35-60 Units into the skin as directed. 50 units in the morning, 35 units at lunch, 60 units at dinner) 10 mL 11   Insulin Syringe-Needle U-100 (SURE COMFORT INSULIN SYRINGE) 31G X 5/16" 0.5 ML MISC USE AS DIRECTED WITH INSULIN 4 TIMES A DAY 400 each 2    lamoTRIgine (LAMICTAL) 25 MG tablet TAKE 1 TABLET BY MOUTH  TWICE DAILY 180 tablet 3   Lancet Devices (ACCU-CHEK SOFTCLIX) lancets Test two times daily 100 each 12   Lancets (ONETOUCH DELICA PLUS [id number]) MISC      levothyroxine (SYNTHROID) 75 MCG tablet TAKE 1 TABLET BY MOUTH  DAILY 90 tablet 3   lisinopril (ZESTRIL) 10 MG tablet TAKE 1 TABLET BY MOUTH  DAILY 90 tablet 3   loratadine (CLARITIN) 10 MG tablet Take 1 tablet (10 mg total) by mouth daily as needed for allergies. 100 tablet 3   nicotine (NICODERM CQ) 21 mg/24hr patch Place 1 patch (21 mg total) onto the skin daily. Then as dirrected 28 patch 0   ondansetron (ZOFRAN) 4 MG tablet Take 4 mg by mouth every 8 (eight) hours as needed for nausea or vomiting.     oxyCODONE (ROXICODONE) 15 MG immediate release tablet Take 1 tablet (15 mg total) by mouth every 6 (six) hours as needed. 120 tablet 0   oxyCODONE (ROXICODONE) 15 MG immediate release tablet Take 1 tablet (15 mg total) by mouth every 6 (six) hours as needed. 120 tablet 0   oxyCODONE (ROXICODONE) 15 MG immediate release tablet Take 1 tablet (15  mg total) by mouth every 6 (six) hours as needed. 120 tablet 0   rOPINIRole (REQUIP) 0.5 MG tablet TAKE 1 TABLET BY MOUTH AT  BEDTIME 90 tablet 3   senna-docusate (SENOKOT-S) 8.6-50 MG tablet Take 1 tablet by mouth 2 (two) times daily. (Patient taking differently: Take 1 tablet by mouth at bedtime.) 20 tablet 0   vitamin B-12 (CYANOCOBALAMIN) 1000 MCG tablet Take 1,000 mcg by mouth daily.     silver sulfADIAZINE (SILVADENE) 1 % cream Apply pea-sized amount to wound daily. (Patient not taking: Reported on 09/09/2021) 50 g 0   No facility-administered medications prior to visit.    ROS: Review of Systems  Constitutional:  Positive for fatigue. Negative for fever.  HENT:  Negative for sore throat, trouble swallowing and voice change.   Musculoskeletal:  Positive for arthralgias, back pain and gait problem.   Objective:  BP 108/66 (BP  Location: Left Arm)   Pulse 81   Temp 98.2 F (36.8 C) (Oral)   SpO2 95%   BP Readings from Last 3 Encounters:  09/09/21 108/66  08/11/21 112/60  05/12/21 (!) 97/56    Wt Readings from Last 3 Encounters:  08/11/21 218 lb 6.4 oz (99.1 kg)  05/12/21 218 lb 4.1 oz (99 kg)  04/28/21 218 lb 3.2 oz (99 kg)    Physical Exam Constitutional:      General: She is not in acute distress.    Appearance: She is well-developed. She is obese.  HENT:     Head: Normocephalic.     Right Ear: External ear normal.     Left Ear: External ear normal.     Nose: Nose normal.  Eyes:     General:        Right eye: No discharge.        Left eye: No discharge.     Conjunctiva/sclera: Conjunctivae normal.     Pupils: Pupils are equal, round, and reactive to light.  Neck:     Thyroid: No thyromegaly.     Vascular: No JVD.     Trachea: No tracheal deviation.  Cardiovascular:     Rate and Rhythm: Normal rate and regular rhythm.     Heart sounds: Normal heart sounds.  Pulmonary:     Effort: No respiratory distress.     Breath sounds: No stridor. No wheezing.  Abdominal:     General: Bowel sounds are normal. There is no distension.     Palpations: Abdomen is soft. There is no mass.     Tenderness: There is no abdominal tenderness. There is no guarding or rebound.  Musculoskeletal:        General: No tenderness.     Cervical back: Normal range of motion and neck supple. No rigidity.  Lymphadenopathy:     Cervical: No cervical adenopathy.  Skin:    Findings: No erythema or rash.  Neurological:     Cranial Nerves: No cranial nerve deficit.     Motor: No abnormal muscle tone.     Coordination: Coordination normal.     Deep Tendon Reflexes: Reflexes normal.  Psychiatric:        Behavior: Behavior normal.        Thought Content: Thought content normal.        Judgment: Judgment normal.  B manib salivary glands are enlarged, NT L tyroid nodule <1 cm  Lab Results  Component Value Date   WBC  9.9 05/07/2021   HGB 14.0 05/07/2021   HCT 44.2 05/07/2021  PLT 300 05/07/2021   GLUCOSE 121 (H) 05/07/2021   CHOL 223 (H) 07/19/2016   TRIG (H) 07/19/2016    527.0 Triglyceride is over 400; calculations on Lipids are invalid.   HDL 27.40 (L) 07/19/2016   LDLDIRECT 106.0 07/19/2016   LDLCALC 81 01/02/2013   ALT 16 01/27/2021   AST 16 01/27/2021   NA 138 05/07/2021   K 4.4 05/07/2021   CL 105 05/07/2021   CREATININE 0.95 05/07/2021   BUN 17 05/07/2021   CO2 27 05/07/2021   TSH 1.23 10/21/2020   INR 1.0 07/19/2019   HGBA1C 8.2 (H) 05/07/2021   MICROALBUR 0.9 07/19/2016    No results found.  Assessment & Plan:   Problem List Items Addressed This Visit     LOW BACK PAIN    Chronic  Cont on Oxycodone 15 mg  Potential benefits of a long term opioids use as well as potential risks (i.e. addiction risk, apnea etc) and complications (i.e. Somnolence, constipation and others) were explained to the patient and were aknowledged. Risks of use w/benzodiazepines discussed       Thyroid nodule    New L tyroid nodule <1 cm Will sch thyroid US      Relevant Orders   US SOFT TISSUE HEAD & NECK (NON-THYROID)   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)    F/u w/dr Chalmers Cater Cont on 70/30 insulin         No orders of the defined types were placed in this encounter.     Follow-up: No follow-ups on file.  Walker Kehr, MD

## 2021-09-09 NOTE — Assessment & Plan Note (Signed)
F/u w/dr Chalmers Cater Cont on 70/30 insulin

## 2021-09-09 NOTE — Addendum Note (Signed)
Addended by: Jacobo Forest on: 09/09/2021 02:18 PM   Modules accepted: Orders

## 2021-09-09 NOTE — Assessment & Plan Note (Signed)
Chronic  Cont on Oxycodone 15 mg  Potential benefits of a long term opioids use as well as potential risks (i.e. addiction risk, apnea etc) and complications (i.e. Somnolence, constipation and others) were explained to the patient and were aknowledged. Risks of use w/benzodiazepines discussed

## 2021-09-09 NOTE — Assessment & Plan Note (Signed)
New L tyroid nodule <1 cm Will sch thyroid US

## 2021-09-13 ENCOUNTER — Ambulatory Visit (INDEPENDENT_AMBULATORY_CARE_PROVIDER_SITE_OTHER): Payer: Medicare Other | Admitting: *Deleted

## 2021-09-13 DIAGNOSIS — E1140 Type 2 diabetes mellitus with diabetic neuropathy, unspecified: Secondary | ICD-10-CM

## 2021-09-13 DIAGNOSIS — E785 Hyperlipidemia, unspecified: Secondary | ICD-10-CM

## 2021-09-13 NOTE — Chronic Care Management (AMB) (Addendum)
Chronic Care Management   CCM RN Visit Note  09/13/2021 Name: Katherine Herrera MRN: [id number] DOB: 04-08-1966  Subjective: Katherine Herrera is a 55 y.o. year old female who is a primary care patient of Plotnikov, [name], MD. The care management team was consulted for assistance with disease management and care coordination needs.    Engaged with patient by telephone for follow up visit in response to provider referral for case management and/or care coordination services.   Consent to Services:  The patient was given information about Chronic Care Management services, agreed to services, and gave verbal consent prior to initiation of services.  Please see initial visit note for detailed documentation.  Patient agreed to services and verbal consent obtained.   Assessment: Review of patient past medical history, allergies, medications, health status, including review of consultants reports, laboratory and other test data, was performed as part of comprehensive evaluation and provision of chronic care management services.   CCM Care Plan Allergies  Allergen Reactions   Cephalexin Nausea And Vomiting   Demerol  [Meperidine Hcl] Rash   Lovastatin Other (See Comments)    Possible myalgia    Metformin And Related Nausea And Vomiting   Sulfamethoxazole-Trimethoprim     Other reaction(s): Unknown DILI, pancreatitis   Crestor [Rosuvastatin Calcium]     myalgia   Niacin Other (See Comments)    Unknown    Other Other (See Comments)   Hydromorphone Hcl Itching    Patient has been tolerating Hydromorphone tablets without adverse effect (07/09/19) Other reaction(s): Unknown   Paroxetine Other (See Comments)    Unknown    Outpatient Encounter Medications as of 09/13/2021  Medication Sig Note   B-D INS SYR ULTRAFINE 1CC/31G 31G X 5/16" 1 ML MISC USE AS DIRECTED    baclofen (LIORESAL) 10 MG tablet Take 2 tablets (20 mg total) by mouth 2 (two) times daily as needed for muscle spasms.     bisacodyl (DULCOLAX) 5 MG EC tablet Take 10 mg by mouth at bedtime.    Blood Glucose Monitoring Suppl (ONETOUCH VERIO) w/Device KIT     cholecalciferol (VITAMIN D3) 25 MCG (1000 UNIT) tablet Take 1,000 Units by mouth at bedtime.    clonazePAM (KLONOPIN) 0.5 MG tablet Take 1 tablet (0.5 mg total) by mouth 2 (two) times daily as needed for anxiety.    docusate sodium (COLACE) 100 MG capsule Take 100 mg by mouth 2 (two) times daily.    doxycycline (VIBRA-TABS) 100 MG tablet Take 1 tablet (100 mg total) by mouth 2 (two) times daily.    ezetimibe (ZETIA) 10 MG tablet TAKE 1 TABLET BY MOUTH  DAILY    fluticasone (FLONASE) 50 MCG/ACT nasal spray Place 2 sprays into both nostrils daily.    gabapentin (NEURONTIN) 800 MG tablet TAKE 1 TABLET BY MOUTH 4  TIMES DAILY    glucose blood (ONETOUCH VERIO) test strip Use to check blood sugar 2 times per day.    insulin NPH-regular Human (70-30) 100 UNIT/ML injection Inject 10 Units into the skin 2 (two) times daily with a meal. (Patient taking differently: Inject 35-60 Units into the skin as directed. 50 units in the morning, 35 units at lunch, 60 units at dinner) 08/30/2021: 08/30/21: Reports currently taking 60 U q am; 45 Units at lunch; 70 Units at dinner    Insulin Syringe-Needle U-100 (SURE COMFORT INSULIN SYRINGE) 31G X 5/16" 0.5 ML MISC USE AS DIRECTED WITH INSULIN 4 TIMES A DAY    lamoTRIgine (LAMICTAL) 25  MG tablet TAKE 1 TABLET BY MOUTH  TWICE DAILY    Lancet Devices (ACCU-CHEK SOFTCLIX) lancets Test two times daily    Lancets (ONETOUCH DELICA PLUS ZOXWRU04V) MISC     levothyroxine (SYNTHROID) 75 MCG tablet TAKE 1 TABLET BY MOUTH  DAILY    lisinopril (ZESTRIL) 10 MG tablet TAKE 1 TABLET BY MOUTH  DAILY    loratadine (CLARITIN) 10 MG tablet Take 1 tablet (10 mg total) by mouth daily as needed for allergies.    nicotine (NICODERM CQ) 21 mg/24hr patch Place 1 patch (21 mg total) onto the skin daily. Then as dirrected    ondansetron (ZOFRAN) 4 MG tablet Take  4 mg by mouth every 8 (eight) hours as needed for nausea or vomiting.    oxyCODONE (ROXICODONE) 15 MG immediate release tablet Take 1 tablet (15 mg total) by mouth every 6 (six) hours as needed.    oxyCODONE (ROXICODONE) 15 MG immediate release tablet Take 1 tablet (15 mg total) by mouth every 6 (six) hours as needed.    oxyCODONE (ROXICODONE) 15 MG immediate release tablet Take 1 tablet (15 mg total) by mouth every 6 (six) hours as needed.    rOPINIRole (REQUIP) 0.5 MG tablet TAKE 1 TABLET BY MOUTH AT  BEDTIME    senna-docusate (SENOKOT-S) 8.6-50 MG tablet Take 1 tablet by mouth 2 (two) times daily. (Patient taking differently: Take 1 tablet by mouth at bedtime.)    vitamin B-12 (CYANOCOBALAMIN) 1000 MCG tablet Take 1,000 mcg by mouth daily.    No facility-administered encounter medications on file as of 09/13/2021.   Patient Active Problem List   Diagnosis Date Noted   Thyroid nodule 09/09/2021   Statin myopathy 08/06/2021   Essential hypertension 10/09/2020   Pure hypercholesterolemia 10/09/2020   Type 2 diabetes mellitus with hyperglycemia ([place]) 10/09/2020   Chronic osteomyelitis involving right ankle and foot ([place])    Cellulitis of right lower leg 01/10/2020   Cellulitis 01/02/2020   Skin cyanosis 01/02/2020   Soft tissue infection 01/01/2020   Septic shock ([place]) 12/31/2019   Peroneal tendonitis, right    Right foot ulcer ([place]) 07/04/2019   Capsulitis of metatarsophalangeal (MTP) joint of right foot    Exostosis of bone of foot    Preop exam for internal medicine 12/19/2018   Polyneuropathy due to type 2 diabetes mellitus ([place]) 10/01/2018   Mild renal insufficiency 09/29/2018   Hyponatremia 09/29/2018   Chronic pain 09/29/2018   Cellulitis of left lower leg    Diabetic foot ulcer ([place])    Sepsis due to cellulitis ([place]) 09/28/2018   Syncope 07/30/2018   Earache on right 08/02/2017   Cerumen impaction 08/02/2017   Constipation due to pain medication 05/22/2017   Elevated  liver enzymes 05/22/2017   AKI (acute kidney injury) ([place]) 05/03/2017   Charcot foot due to diabetes mellitus ([place]) 04/23/2017   Leg swelling 10/28/2016   Knee contusion 01/13/2016   Pain from implanted hardware 01/06/2016   Cellulitis and abscess of toe of right foot 07/24/2015   Nausea without vomiting 07/24/2015   Leg edema, left 06/19/2015   DOE (dyspnea on exertion) 06/16/2015   Chest pain, atypical 06/16/2015   Family history of musculoskeletal disease 03/12/2015   Non-compliant behavior 12/24/2014   Ulcer of right foot limited to breakdown of skin ([place]) 09/02/2014   Ulcer of foot due to diabetes mellitus ([place]) 08/12/2014   Ulcer of other part of foot 06/20/2014   Leg pain, left 03/31/2014   Callus of foot 03/25/2014  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   Rash 01/28/2014   Pain in lower limb 01/24/2014   Keratosis 01/24/2014   Leg cramps 10/09/2013   Vaginitis and vulvovaginitis 10/09/2013   Ingrown nail 09/30/2013   Pain in toe of left foot 09/30/2013   Tenosynovitis of foot and ankle 09/03/2013   Metatarsal deformity, left 09/03/2013   Pain in joint, ankle and foot 01/22/2013   Pes cavus 10/13/2012   Acquired tight Achilles tendon 10/13/2012   Paronychia 06/14/2012   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 ([place]) 03/19/2012   Allergic rhinitis 03/19/2012   URI (upper respiratory infection) 08/30/2011   Myalgia 04/18/2011   SHOULDER PAIN 01/17/2011   TTS (tarsal tunnel syndrome)    GASTROENTERITIS 10/18/2010   ARTHRALGIA 01/04/2010   WEIGHT GAIN, ABNORMAL 01/04/2010   TACHYCARDIA 10/02/2009   CBC, ABNORMAL 05/01/2009   B12 deficiency 01/05/2009   GOUT 01/05/2009   Edema 01/05/2009   Anxiety disorder 07/28/2008   ELBOW PAIN 07/28/2008   TOBACCO USE DISORDER/SMOKER-SMOKING CESSATION DISCUSSED 04/21/2008   FOOT PAIN 04/21/2008   PARESTHESIA 04/21/2008   Abdominal pain 04/21/2008   SINUSITIS- ACUTE-NOS 11/13/2007   COPD exacerbation ([place]) 11/13/2007    Diabetic neuropathy ([place]) 08/27/2007   LOW BACK PAIN 08/27/2007   Dyslipidemia 08/20/2007   Hypothyroidism 05/22/2007   Conditions to be addressed/monitored:  HLD and DMII  Care Plan : Diabetes Type 2 (Adult)  Updates made by Knox Royalty, RN since 09/13/2021 12:00 AM     Problem: Glycemic Management (Diabetes, Type 2)   Priority: Medium     Long-Range Goal: Glycemic Management Optimized   Start Date: 05/27/2021  Expected End Date: 05/27/2022  Recent Progress: Not on track  Priority: High  Note:   Objective:  Lab Results  Component Value Date   HGBA1C 8.2 (H) 05/07/2021   Lab Results  Component Value Date   CREATININE 0.95 05/07/2021   CREATININE 1.17 (H) 01/27/2021   CREATININE 1.04 10/21/2020   No results found for: EGFR Current Barriers:  Knowledge Deficits related to basic Diabetes pathophysiology and self care/management- could benefit from ongoing reinforcement and support Occasionally forgets insulin- midday dose (when family visiting) Lack of motivation to adherence to established plan of care  08/30/21: patient reports she has not been monitoring/ recording blood sugars at home: unclear reasons: "I just haven't been" 08/30/21: worsening diabetic ulceration wound to (R) foot per podiatry provider- patient placed on antibiotics 08/24/21 Case Manager Clinical Goal(s):  Over the next 12 months, patient will demonstrate ongoing adherence to prescribed treatment plan for diabetes self care/management as evidenced by patient reporting during CCM RN CM outreach of:  Daily monitoring and recording of CBG 2-4 times/ day Adherence to ADA/ carb modified, low sugar diet  Adherence to prescribed medication regimen  Contacting care providers for new or worsened symptoms, concerns or questions Interventions:  Collaboration with Plotnikov, [name], MD regarding development and update of comprehensive plan of care as evidenced by provider attestation and  co-signature Inter-disciplinary care team collaboration (see longitudinal plan of care Review of patient status, including review of consultants reports, relevant laboratory and other test results, and medications completed  09/13/21- Goal completed due to duplication of goals, progression of care plan with conversion to new format; goals re-established and extended     Care Plan : RN Care Manager Plan of Care  Updates made by Knox Royalty, RN since 09/13/2021 12:00 AM     Problem: Chronic Disease Management Needs   Priority: High  Long-Range Goal: Ongoing development of plan of care for long term chronic disease management   Start Date: 05/27/2021  Expected End Date: 05/27/2022  Priority: High  Note:   Current Barriers:  Chronic Disease Management support and education needs related to HLD and DMII Knowledge Deficits related to basic Diabetes pathophysiology and self care/management- will benefit from ongoing reinforcement and support Occasionally forgets insulin- midday dose ("especially" when family visiting) Lack of motivation to adherence to established plan of care  08/30/21: patient reports she has not been monitoring/ recording blood sugars at home: unclear reasons: "I just haven't been:"  09/13/21-- reports has now started monitoring/ recording at home 08/30/21: worsening diabetic ulceration wound to (R) foot per podiatry provider- patient placed on antibiotics 08/24/21: 09/13/21- reports wound "better;" reports took antibiotics as prescribed   RNCM Clinical Goal(s):  Patient will demonstrate Improved adherence to prescribed treatment plan for HLD and DMII as evidenced by patient reporting of adherence to blood sugar monitoring at home; taking medications as prescribed, learning about dietary strategies for self-health management in HLD/ DMII  through collaboration with RN Care manager, provider, and care team.   Interventions: 1:1 collaboration with primary care  provider regarding development and update of comprehensive plan of care as evidenced by provider attestation and co-signature Inter-disciplinary care team collaboration (see longitudinal plan of care) Evaluation of current treatment plan related to  self management and patient's adherence to plan as established by provider  Hyperlipidemia Interventions: Medication review performed; medication list updated in electronic medical record.  Provider established cholesterol goals reviewed; Counseled on importance of regular laboratory monitoring as prescribed; Provided HLD educational materials; Reviewed role and benefits of statin for ASCVD risk reduction; Reviewed importance of limiting foods high in cholesterol; Patient reports she did not receive previously mailed printed educational material-- apparently, her address listed is for her ex-husband's address; will re-mail to her address  Diabetes Interventions: Assessed patient's understanding of A1c goal: <6.5% Provided education to patient about basic DM disease process; Counseled on importance of regular laboratory monitoring as prescribed; Discussed plans with patient for ongoing care management follow up and provided patient with direct contact information for care management team; Reviewed scheduled/upcoming provider appointments including: 09/14/21- podiatry follow up on foot wound/ ulcer; Review of patient status, including review of consultants reports, relevant laboratory and other test results, and medications completed; Confirmed that patient has finally started monitoring and recording blood sugars at home fasting/ post-prandial- positive reinforcement provided with encouragement to continue efforts Reviewed recent blood sugar ranges at home Fasting: 136-194: reports "mainly in 180-190 range" Post-prandial: 109-179: reports "mainly in 165-180 range" Confirmed no recent low blood sugars at home Discussed general goals for fasting  (<150 to start, ultimately < 130) and postprandial (<160-180) Briefly reinforced previously provided education around correlation of A1-C values to home blood sugars, using personal trends: she continues to require ongoing reinforcement Discussed with provided education around lifestyle/ dietary strategies to reduce blood sugar values: portion control, limiting carbohydrates/ sugar foods/ importance of regular exercise/ activity Confirmed patient took antibiotics as prescribed after last foot center visit: states "wound seems better;" confirmed she plans at attend tomorrow's scheduled appointment Confirmed continues to independently manage insulin at home: reports continues taking 60 Units q am; 45 U at lunch; and 70 U evening Attempted to determine patient's next endocrinology appointment: she is unsure when she is due to return-- encouraged her to follow up and schedule: made patient aware it appears her last endocrinology office visit appears to have been  in September 2022-- patient can not tell me details around visit/ updated A1-C, etc- encouraged her follow up/ engagement with  endocrinology team Reinforced long-term complications/ risks of ongoing high blood sugar-- patient will continue to require ongoing reinforcement and support Confirmed patient did not receive previously provided "Living Well with Diabetes" booklet-- will re-send to correct address  Lab Results  Component Value Date   HGBA1C 8.2 (H) 05/07/2021  Patient Goals/Self-Care Activities: As evidenced by review of EHR, collaboration with care team, and patient reporting during CCM RN CM outreach, Patient  Orabelle [name] will: Self administer medications as prescribed Attend all scheduled provider appointments Call pharmacy for medication refills Call provider office for new concerns or questions Monitor and write down blood sugar 3-4 times per day: first thing in the morning before you eat and then again 2 hours after a meal, and  at bedtime Write these blood sugar values at home down so we can review them together during our future phone calls Check blood sugar more often if I feel it is too high or too low or if I feel sick Continue to follow heart healthy, low salt, low cholesterol, carbohydrate-modified, low sugar diet-- please try to limit your portion sizes Take medications as prescribed Write down questions for my endocrinologist (diabetes doctor) and talk to her about about medications like trulicity and ozempic to see if these would be good medications for you to try Keep cholesterol low by eating the right foods Continue taking care of your foot wound and attending podiatry (foot doctor) office visits Review enclosed educational material  Follow Up Plan:   Telephone follow up appointment with care management team member scheduled for:  Friday, October 01, 2021 at 1:00 pm The patient has been provided with contact information for the care management team and has been advised to call with any health related questions or concerns    Plan: The patient has been provided with contact information for the care management team and has been advised to call with any health related questions or concerns   Oneta Rack, RN, BSN, [place] [phone]: direct office [phone]: mobile   Medical screening examination/treatment/procedure(s) were performed by non-physician practitioner and as supervising physician I was immediately available for consultation/collaboration.  I agree with above. Lew Dawes, MD

## 2021-09-13 NOTE — Patient Instructions (Addendum)
Visit Information   Katherine Herrera, "Katherine Herrera" it was nice talking with you today.   Please read over the attached information, and keep up the good work continuing to monitor and write down your blood sugars at home   I look forward to talking to you again for a telephone update on Friday, October 01, 2021 at 1:00 pm- please be listening out for my call that day.  I will call as close to 1:00 pm as possible.   If you need to cancel or re-schedule our telephone visit, please call [phone] and one of our care guides will be happy to assist you.   I look forward to hearing about your progress.   Please don't hesitate to contact me if I can be of assistance to you before our next scheduled telephone appointment.   Oneta Rack, RN, BSN, [facility] RN Care Coordination- [place] [phone]: direct office [phone]: mobile   Patient Self-Care Activities: Patient Katherine Herrera/ "Katherine Herrera"  will: Self administer medications as prescribed Attend all scheduled provider appointments Call pharmacy for medication refills Call provider office for new concerns or questions Monitor and write down blood sugar 3-4 times per day: first thing in the morning before you eat and then again 2 hours after a meal, and at bedtime Write these blood sugar values at home down so we can review them together during our future phone calls Check blood sugar more often if I feel it is too high or too low or if I feel sick Continue to follow heart healthy, low salt, low cholesterol, carbohydrate-modified, low sugar diet-- please try to limit your portion sizes Take medications as prescribed Write down questions for my endocrinologist (diabetes doctor) and talk to her about about medications like trulicity and ozempic to see if these would be good medications for you to try Keep cholesterol low by eating the right foods Continue taking care of your foot wound and attending podiatry (foot doctor)  office visits Review enclosed educational material   Preventing Diabetes Mellitus Complications You can help to prevent or slow down problems that are caused by diabetes (diabetes mellitus). Following your diabetes plan and taking care of yourself can reduce your risk of serious or life-threatening complications. What actions can I take to prevent diabetes complications? Diabetes management  Follow instructions from your health care providers about managing your diabetes. Your diabetes may be managed by a team of health care providers who can teach you how to care for yourself and can answer questions that you have. Educate yourself about your condition so you can make healthy choices about eating and physical activity. Know your target range for your blood sugar (glucose), and check your blood glucose level as often as told. Your health care provider will help you decide how often to check your blood glucose level depending on your treatment goals and how well you are meeting them. Ask your health care provider if you should take low-dose aspirin daily and what dose is recommended for you. Taking low-dose aspirin daily is recommended to help prevent cardiovascular disease. Controlling your blood pressure and cholesterol Your personal target blood pressure is determined based on: Your age. Your medicines. How long you have had diabetes. Any other medical conditions you have. To control your blood pressure: Follow instructions from your health care provider about meal planning, exercise, and medicines. Make sure your health care provider checks your blood pressure at every medical visit. Monitor your blood pressure at home as  told by your health care provider. To control your cholesterol: Follow instructions from your health care provider about meal planning, exercise, and medicines. Have your cholesterol checked at least once a year. You may be prescribed medicine to lower cholesterol  (statin). If you are not taking a statin, ask your health care provider if you should be. Controlling your cholesterol may: Help prevent heart disease and stroke. These are the most common health problems for people with diabetes. Improve your blood flow.  Medical appointments and vaccines Schedule and keep yearly physical exams and eye exams. Your health care provider will tell you how often you need medical visits depending on your diabetes management plan. Keep all follow-up visits as told. This is important so possible problems can be identified early and complications can be avoided or treated. Every visit with your health care provider should include measuring your: Weight. Blood pressure. Blood glucose control. Your A1C (hemoglobin A1C) level should be checked: At least 2 times a year, if you are meeting your treatment goals. 4 times a year, if you are not meeting treatment goals or if your treatment goals have changed. Your blood lipids (lipid profile) should be checked yearly. You should also be checked yearly for protein in your urine (urine microalbumin). If you have type 1 diabetes, get an eye exam 3-5 years after you are diagnosed, and then once a year after your first exam. If you have type 2 diabetes, get an eye exam as soon as you are diagnosed, and then once a year after your first exam. It is also important to keep your vaccines current. It is recommended that you receive: A flu (influenza) vaccine every year. A pneumonia (pneumococcal) vaccine and a hepatitis B vaccine. If you are age 66 or older, you may get the pneumonia vaccine as a series of two separate shots. Ask your health care provider which other vaccines may be recommended. Lifestyle Do not use any products that contain nicotine or tobacco, such as cigarettes, e-cigarettes, and chewing tobacco. If you need help quitting, ask your health care provider. By avoiding nicotine and tobacco: You will lower your risk for  heart attack, stroke, nerve disease, and kidney disease. Your cholesterol and blood pressure may improve. Your blood circulation will improve. If you drink alcohol: Limit how much you use to: 0-1 drink a day for women who are not pregnant. 0-2 drinks a day for men. Be aware of how much alcohol is in your drink. In the [place] one drink equals one 12 oz bottle of beer (355 mL), one 5 oz glass of wine (148 mL), or one 11?2 oz glass of hard liquor (44 mL). Taking care of your feet Diabetes may cause you to have poor blood circulation to your legs and feet. Because of this, taking care of your feet is very important. Diabetes can cause: The skin on the feet to get thinner, break more easily, and heal more slowly. Nerve damage in your legs and feet, which results in decreased feeling. You may not notice minor injuries that could lead to serious problems. To avoid foot problems: Check your skin and feet every day for cuts, bruises, redness, blisters, or sores. Schedule a foot exam with your health care provider once every year. This exam includes: Inspecting the structure and skin of your feet. Checking the pulses and sensation in your feet. Make sure that your health care provider performs a visual foot exam at every medical visit.  Taking care of your teeth People  with poorly controlled diabetes are more likely to have gum (periodontal) disease. Diabetes can make periodontal diseases harder to control. If not treated, periodontal diseases can lead to tooth loss. To prevent this: Brush your teeth twice a day. Floss at least once a day. Visit your dentist 2 times a year. Managing stress Living with diabetes can be stressful. When you are experiencing stress, your blood glucose may be affected in two ways: Stress hormones may cause your blood glucose to rise. You may be distracted from taking good care of yourself. Be aware of your stress level and make changes to help you manage challenging  situations. To lower your stress levels: Consider joining a support group. Do planned relaxation or meditation. Do a hobby that you enjoy. Maintain healthy relationships. Exercise regularly. Work with your health care provider or a mental health professional. Where to find more information American Diabetes Association: www.diabetes.org Association of Diabetes Care and Education Specialists: www.diabeteseducator.org Summary You can take action to prevent or slow down problems that are caused by diabetes (diabetes mellitus). Following your diabetes plan and taking care of yourself can reduce your risk of serious or life-threatening complications. Follow instructions from your health care providers about managing your diabetes. Your diabetes may be managed by a team of health care providers who can teach you how to care for yourself and can answer questions that you have. Know your target range for your blood sugar (glucose), and check your blood glucose levels as often as told. Your health care provider will help you decide how often you should check your blood glucose level depending on your treatment goals and how well you are meeting them. Your health care provider will tell you how often you need medical visits depending on your diabetes management plan. Keep all follow-up visits as directed. This is important so possible problems can be identified early and complications can be avoided or treated. This information is not intended to replace advice given to you by your health care provider. Make sure you discuss any questions you have with your health care provider. Document Revised: 12/06/2019 Document Reviewed: 12/06/2019 Elsevier Patient Education  [place].  Cholesterol Content in Foods Cholesterol is a waxy, fat-like substance that helps to carry fat in the blood. The body needs cholesterol in small amounts, but too much cholesterol can cause damage to the arteries and  heart. What foods have cholesterol? Cholesterol is found in animal-based foods, such as meat, seafood, and dairy. Generally, low-fat dairy and lean meats have less cholesterol than full-fat dairy and fatty meats. The milligrams of cholesterol per serving (mg per serving) of common cholesterol-containing foods are listed below. Meats and other proteins Egg -- one large whole egg has 186 mg. Veal shank -- 4 oz (113 g) has 141 mg. Lean ground [place] (93% lean) -- 4 oz (113 g) has 118 mg. Fat-trimmed lamb loin -- 4 oz (113 g) has 106 mg. Lean ground beef (90% lean) -- 4 oz (113 g) has 100 mg. Lobster -- 3.5 oz (99 g) has 90 mg. Pork loin chops -- 4 oz (113 g) has 86 mg. Canned salmon -- 3.5 oz (99 g) has 83 mg. Fat-trimmed beef top loin -- 4 oz (113 g) has 78 mg. Frankfurter -- 1 frank (3.5 oz or 99 g) has 77 mg. Crab -- 3.5 oz (99 g) has 71 mg. Roasted chicken without skin, white meat -- 4 oz (113 g) has 66 mg. Light bologna -- 2 oz (57 g)  has 45 mg. Deli-cut [place] -- 2 oz (57 g) has 31 mg. Canned tuna -- 3.5 oz (99 g) has 31 mg. Berniece Salines -- 1 oz (28 g) has 29 mg. Oysters and mussels (raw) -- 3.5 oz (99 g) has 25 mg. Mackerel -- 1 oz (28 g) has 22 mg. Trout -- 1 oz (28 g) has 20 mg. Pork sausage -- 1 link (1 oz or 28 g) has 17 mg. Salmon -- 1 oz (28 g) has 16 mg. Tilapia -- 1 oz (28 g) has 14 mg. Dairy Soft-serve ice cream --  cup (4 oz or 86 g) has 103 mg. Whole-milk yogurt -- 1 cup (8 oz or 245 g) has 29 mg. Cheddar cheese -- 1 oz (28 g) has 28 mg. American cheese -- 1 oz (28 g) has 28 mg. Whole milk -- 1 cup (8 oz or 250 mL) has 23 mg. 2% milk -- 1 cup (8 oz or 250 mL) has 18 mg. Cream cheese -- 1 tablespoon (Tbsp) (14.5 g) has 15 mg. Cottage cheese --  cup (4 oz or 113 g) has 14 mg. Low-fat (1%) milk -- 1 cup (8 oz or 250 mL) has 10 mg. Sour cream -- 1 Tbsp (12 g) has 8.5 mg. Low-fat yogurt -- 1 cup (8 oz or 245 g) has 8 mg. Nonfat Greek yogurt -- 1 cup (8 oz or 228 g) has 7  mg. Half-and-half cream -- 1 Tbsp (15 mL) has 5 mg. Fats and oils Cod liver oil -- 1 tablespoon (Tbsp) (13.6 g) has 82 mg. Butter -- 1 Tbsp (14 g) has 15 mg. Lard -- 1 Tbsp (12.8 g) has 14 mg. Bacon grease -- 1 Tbsp (12.9 g) has 14 mg. Mayonnaise -- 1 Tbsp (13.8 g) has 5-10 mg. Margarine -- 1 Tbsp (14 g) has 3-10 mg. The items listed above may not be a complete list of foods with cholesterol. Exact amounts of cholesterol in these foods may vary depending on specific ingredients and brands. Contact a dietitian for more information. What foods do not have cholesterol? Most plant-based foods do not have cholesterol unless you combine them with a food that has cholesterol. Foods without cholesterol include: Grains and cereals. Vegetables. Fruits. Vegetable oils, such as olive, canola, and sunflower oil. Legumes, such as peas, beans, and lentils. Nuts and seeds. Egg whites. The items listed above may not be a complete list of foods that do not have cholesterol. Contact a dietitian for more information. Summary The body needs cholesterol in small amounts, but too much cholesterol can cause damage to the arteries and heart. Cholesterol is found in animal-based foods, such as meat, seafood, and dairy. Generally, low-fat dairy and lean meats have less cholesterol than full-fat dairy and fatty meats. This information is not intended to replace advice given to you by your health care provider. Make sure you discuss any questions you have with your health care provider. Document Revised: 02/26/2021 Document Reviewed: 02/26/2021 Elsevier Patient Education  [place].   The patient verbalized understanding of instructions, educational materials, and care plan provided today and agreed to receive a mailed copy of patient instructions, educational materials, and care plan Telephone follow up appointment with care management team member scheduled for: Friday, October 01, 2021 at 1:00 pm The  patient has been provided with contact information for the care management team and has been advised to call with any health related questions or concerns  Oneta Rack, RN, BSN, [facility] RN  Care Coordination- [place] [phone]: direct office [phone]: mobile

## 2021-09-14 ENCOUNTER — Other Ambulatory Visit: Payer: Self-pay

## 2021-09-14 ENCOUNTER — Ambulatory Visit (INDEPENDENT_AMBULATORY_CARE_PROVIDER_SITE_OTHER): Payer: Medicare Other | Admitting: Podiatry

## 2021-09-14 DIAGNOSIS — E11621 Type 2 diabetes mellitus with foot ulcer: Secondary | ICD-10-CM | POA: Diagnosis not present

## 2021-09-14 DIAGNOSIS — L97411 Non-pressure chronic ulcer of right heel and midfoot limited to breakdown of skin: Secondary | ICD-10-CM

## 2021-09-14 NOTE — Progress Notes (Signed)
  Subjective:  Patient ID: Katherine Herrera, female    DOB: [birth year],  MRN: [id number]  Chief Complaint  Patient presents with   Routine Post Op    Recheck diabetic ulcer of right midfoot. Pt states she is doing well. No concerns.    DOS: 05/12/21 Procedure: RT FOOT WOUND DEBRIDMENT & IRRIGATION W/POSS SKIN GRAFT SUB, PARTIAL EXCSION OF 5TH METATARSAL BASE BONE, FLEXOR TENOTOMY, ARTHROPLASTY OF 2ND TOE    55 y.o. female presents with the above complaint. History confirmed with patient.   Objective:  Physical Exam: no tenderness at the surgical site, local edema noted, and calf supple, nontender. Incision: healed wound 2nd toe  Plantar 5th met wound improved - measuring 0.5cm diameter with granular base. Moderate HPK around wound, no warmth. No probe to bone, no exposed bone Assessment:   1. Diabetic ulcer of right midfoot associated with type 2 diabetes mellitus, limited to breakdown of skin ([place])    Plan:  Patient was evaluated and treated and all questions answered.  Ulcer right plantar foot -Wound cleansed and debrided -Dressing applied consisting of silvadene and foam border dressing -Offload ulcer with surgical shoe. This appears to have improved the wound considerably.  Procedure: Selective Debridement of Wound Rationale: Removal of devitalized tissue from the wound to promote healing.  Pre-Debridement Wound Measurements: 0.5 cm x 0.5 cm x 0.2 cm  Post-Debridement Wound Measurements: same as pre-debridement. Type of Debridement: sharp selective Instrumentation: dermal curette, tissue nipper Tissue Removed: Devitalized soft-tissue Dressing: Dry, sterile, compression dressing. Disposition: Patient tolerated procedure well.  Return in about 3 weeks (around 10/05/2021) for Wound Care.

## 2021-09-16 ENCOUNTER — Ambulatory Visit
Admission: RE | Admit: 2021-09-16 | Discharge: 2021-09-16 | Disposition: A | Discharge status: Home / Self Care | Payer: Medicare Other | Source: Ambulatory Visit | Attending: Internal Medicine | Admitting: Internal Medicine

## 2021-09-16 DIAGNOSIS — E041 Nontoxic single thyroid nodule: Secondary | ICD-10-CM | POA: Diagnosis not present

## 2021-09-27 ENCOUNTER — Other Ambulatory Visit: Payer: Self-pay | Admitting: Internal Medicine

## 2021-09-29 DIAGNOSIS — E1140 Type 2 diabetes mellitus with diabetic neuropathy, unspecified: Secondary | ICD-10-CM

## 2021-09-29 DIAGNOSIS — E785 Hyperlipidemia, unspecified: Secondary | ICD-10-CM

## 2021-10-01 ENCOUNTER — Telehealth: Payer: Medicare Other

## 2021-10-01 ENCOUNTER — Encounter: Payer: Self-pay | Admitting: *Deleted

## 2021-10-01 ENCOUNTER — Telehealth: Payer: Self-pay | Admitting: *Deleted

## 2021-10-01 NOTE — Telephone Encounter (Signed)
  Chronic Care Management   Follow Up Note   10/01/2021 Name: Katherine Herrera MRN: [id number] DOB: 08/22/1966  Referred by: Cassandria Anger, MD Reason for referral : No chief complaint on file.  An unsuccessful telephone outreach was attempted today. The patient was referred to the case management team for assistance with care management and care coordination.   Follow Up Plan:  A HIPPA compliant phone message was left for the patient providing contact information and requesting a return call Will place request with scheduling care guide to contact patient to re-schedule today's missed CCM RN follow up telephone appointment   Oneta Rack, RN, BSN, [place] [phone]: direct office

## 2021-10-05 ENCOUNTER — Ambulatory Visit (INDEPENDENT_AMBULATORY_CARE_PROVIDER_SITE_OTHER): Payer: Medicare Other | Admitting: Podiatry

## 2021-10-05 ENCOUNTER — Ambulatory Visit: Payer: Medicare Other

## 2021-10-05 ENCOUNTER — Other Ambulatory Visit: Payer: Self-pay

## 2021-10-05 DIAGNOSIS — E1142 Type 2 diabetes mellitus with diabetic polyneuropathy: Secondary | ICD-10-CM | POA: Diagnosis not present

## 2021-10-05 DIAGNOSIS — L89899 Pressure ulcer of other site, unspecified stage: Secondary | ICD-10-CM | POA: Diagnosis not present

## 2021-10-05 DIAGNOSIS — L97411 Non-pressure chronic ulcer of right heel and midfoot limited to breakdown of skin: Secondary | ICD-10-CM | POA: Diagnosis not present

## 2021-10-05 DIAGNOSIS — E11621 Type 2 diabetes mellitus with foot ulcer: Secondary | ICD-10-CM

## 2021-10-05 NOTE — Progress Notes (Signed)
  Subjective:  Patient ID: Katherine Herrera, female    DOB: 12-Aug-1966,  MRN: [id number]  Chief Complaint  Patient presents with   Wound Check    F/U Rt wound -pt states," looks same, some days with more draiange thatn others." - less redness and swelling - w/ tingling at top -4-5/10 pain - Tx: SSD and bandaid daily and sx shoe   DOS: 05/12/21 Procedure: RT FOOT WOUND DEBRIDMENT & IRRIGATION W/POSS SKIN GRAFT SUB, PARTIAL EXCSION OF 5TH METATARSAL BASE BONE, FLEXOR TENOTOMY, ARTHROPLASTY OF 2ND TOE    55 y.o. female presents with the above complaint. History confirmed with patient.   Objective:  Physical Exam: no tenderness at the surgical site, local edema noted, and calf supple, nontender. Incision: healed wound 2nd toe  Plantar 5th met wound measuring 1* 0.5cm with granular base. Moderate HPK around wound, no warmth. No probe to bone, no exposed bone Assessment:   1. Diabetic ulcer of right midfoot associated with type 2 diabetes mellitus, limited to breakdown of skin ([place])    Plan:  Patient was evaluated and treated and all questions answered.  Ulcer right plantar foot -Wound cleansed and debrided -Dressing applied consisting of silvadene and foam border dressing -Offload ulcer with surgical shoe. -Dispensed DM shoes - will allow to wear left, hold off wearing right  Procedure: Excisional Debridement of Wound Indication: Removal of non-viable soft tissue from the wound to promote healing.  Anesthesia: none Pre-Debridement Wound Measurements: 0.5 cm x 0.5 cm x 0.2 cm  Post-Debridement Wound Measurements: 1 cm x 0.5 cm x 0.2 cm  Type of Debridement: Sharp Excisional Tissue Removed: Non-viable soft tissue Instrumentation: 15 blade and tissue nipper Depth of Debridement: subcutaneous tissue. Technique: Sharp excisional debridement to bleeding, viable wound base.  Dressing: Dry, sterile, compression dressing. Disposition: Patient tolerated procedure well.   Return in about 4  weeks (around 11/02/2021) for Wound Care.

## 2021-10-05 NOTE — Progress Notes (Signed)
SITUATION Reason for Visit: Fitting of Diabetic Shoes & Insoles Patient / Caregiver Report:  Patient is satisfied with fit and function of shoes.  OBJECTIVE DATA: Patient History / Diagnosis:  DM type 2 with diabetic peripheral neuropathy ([place])  Change in Status:   None  ACTIONS PERFORMED: In-Person Delivery, patient was fit with: - 1x pair A5500 PDAC approved prefabricated Diabetic Shoes - 3x pair [id number] PDAC approved CAM milled custom diabetic insoles  Shoes and insoles were verified for structural integrity and safety. Patient wore shoes and insoles in office. Skin was inspected and free of areas of concern after wearing shoes and inserts. Shoes and inserts fit properly. Patient / Caregiver provided with ferbal instruction and demonstration regarding donning, doffing, wear, care, proper fit, function, purpose, cleaning, and use of shoes and insoles ' and in all related precautions and risks and benefits regarding shoes and insoles. Patient / Caregiver was instructed to wear properly fitting socks with shoes at all times. Patient was also provided with verbal instruction regarding how to report any failures or malfunctions of shoes or inserts, and necessary follow up care. Patient / Caregiver was also instructed to contact physician regarding change in status that may affect function of shoes and inserts.   Patient / Caregiver verbalized undersatnding of instruction provided. Patient / Caregiver demonstrated independence with proper donning and doffing of shoes and inserts.  Patient understands she is only to wear the left shoe until Dr. March Rummage concludes her wound is stable enough for bilateral shoe wear.  PLAN Patient to follow up as needed. Plan of care was discussed with and agreed upon by patient and/or caregiver. All questions were answered and concerns addressed.

## 2021-10-05 NOTE — Telephone Encounter (Signed)
Rescheduled 10/28/21  [facility] [facility]  Direct Dial: [phone]

## 2021-10-28 ENCOUNTER — Ambulatory Visit (INDEPENDENT_AMBULATORY_CARE_PROVIDER_SITE_OTHER): Payer: Medicare Other | Admitting: *Deleted

## 2021-10-28 DIAGNOSIS — E1140 Type 2 diabetes mellitus with diabetic neuropathy, unspecified: Secondary | ICD-10-CM

## 2021-10-28 DIAGNOSIS — E785 Hyperlipidemia, unspecified: Secondary | ICD-10-CM

## 2021-10-28 NOTE — Patient Instructions (Addendum)
Visit Information  [place]) Katherine Herrera, thank you for taking time to talk with me today. Please don't hesitate to contact me if I can be of assistance to you before our next scheduled telephone appointment.  Below are the goals we discussed today:  Patient Self-Care Activities: Patient Katherine Herrera "Katherine Herrera" will: Take medications as prescribed Attend all scheduled provider appointments Call pharmacy for medication refills Call provider office for new concerns or questions Monitor and write down blood sugar several times per day: first thing in the morning before you eat and then again 2 hours after a meal, and at bedtime Your goal for fasting blood sugars at home is: less than or equal to 130 Your goal for after-eating blood sugars is: between 160-180, 2 hours after a regular sized meal Write these blood sugar values at home down so we can review them together during our future phone calls Check blood sugar more often if I feel it is too high or too low or if I feel sick Continue to follow heart healthy, low salt, low cholesterol, carbohydrate-modified, low sugar diet-- please try to limit your portion sizes Write down questions for my endocrinologist (diabetes doctor) and talk to her about about medications like trulicity and ozempic to see if these would be good medications for you to try Keep cholesterol low by eating the right foods and continuing to take prescribed medication for high cholesterol Continue taking care of your foot wound and attending podiatry (foot doctor) office visits Continue reviewing previously provided educational material   Our next scheduled telephone follow up visit/ appointment with care management team member is scheduled on:  Monday, November 29, 2021 at 11:30 am  If you need to cancel or re-schedule our visit, please call [phone] and our care guide team will be happy to assist you.   I look forward to hearing about your progress.   Oneta Rack, RN, BSN,  [place] [phone]: direct office  If you are experiencing a Mental Health or [place] or need someone to talk to, please  call the Suicide and Crisis Lifeline: 988 call the [place] National Suicide Prevention Lifeline: 817-108-4040 or TTY: 458-766-1121 TTY 361-818-0113) to talk to a trained counselor call 1-800-273-TALK (toll free, 24 hour hotline) go to [facility] [address] [place] [phone]) call 911   The patient verbalized understanding of instructions, educational materials, and care plan provided today and agreed to receive a mailed copy of patient instructions, educational materials, and care plan   Preventing Diabetes Mellitus Complications You can help to prevent or slow down problems that are caused by diabetes (diabetes mellitus). Following your diabetes plan and taking care of yourself can reduce your risk of serious or life-threatening complications. What actions can I take to prevent diabetes complications? Diabetes management  Follow instructions from your health care providers about managing your diabetes. Your diabetes may be managed by a team of health care providers who can teach you how to care for yourself and can answer questions that you have. Educate yourself about your condition so you can make healthy choices about eating and physical activity. Know your target range for your blood sugar (glucose), and check your blood glucose level as often as told. Your health care provider will help you decide how often to check your blood glucose level depending on your treatment goals and how well you are meeting them. Ask your health care provider if  you should take low-dose aspirin daily and what dose is recommended for you. Taking low-dose aspirin daily is recommended to help prevent cardiovascular disease. Controlling your blood pressure and  cholesterol Your personal target blood pressure is determined based on: Your age. Your medicines. How long you have had diabetes. Any other medical conditions you have. To control your blood pressure: Follow instructions from your health care provider about meal planning, exercise, and medicines. Make sure your health care provider checks your blood pressure at every medical visit. Monitor your blood pressure at home as told by your health care provider. To control your cholesterol: Follow instructions from your health care provider about meal planning, exercise, and medicines. Have your cholesterol checked at least once a year. You may be prescribed medicine to lower cholesterol (statin). If you are not taking a statin, ask your health care provider if you should be. Controlling your cholesterol may: Help prevent heart disease and stroke. These are the most common health problems for people with diabetes. Improve your blood flow.  Medical appointments and vaccines Schedule and keep yearly physical exams and eye exams. Your health care provider will tell you how often you need medical visits depending on your diabetes management plan. Keep all follow-up visits as told. This is important so possible problems can be identified early and complications can be avoided or treated. Every visit with your health care provider should include measuring your: Weight. Blood pressure. Blood glucose control. Your A1C (hemoglobin A1C) level should be checked: At least 2 times a year, if you are meeting your treatment goals. 4 times a year, if you are not meeting treatment goals or if your treatment goals have changed. Your blood lipids (lipid profile) should be checked yearly. You should also be checked yearly for protein in your urine (urine microalbumin). If you have type 1 diabetes, get an eye exam 3-5 years after you are diagnosed, and then once a year after your first exam. If you have type 2  diabetes, get an eye exam as soon as you are diagnosed, and then once a year after your first exam. It is also important to keep your vaccines current. It is recommended that you receive: A flu (influenza) vaccine every year. A pneumonia (pneumococcal) vaccine and a hepatitis B vaccine. If you are age 58 or older, you may get the pneumonia vaccine as a series of two separate shots. Ask your health care provider which other vaccines may be recommended. Lifestyle Do not use any products that contain nicotine or tobacco, such as cigarettes, e-cigarettes, and chewing tobacco. If you need help quitting, ask your health care provider. By avoiding nicotine and tobacco: You will lower your risk for heart attack, stroke, nerve disease, and kidney disease. Your cholesterol and blood pressure may improve. Your blood circulation will improve. If you drink alcohol: Limit how much you use to: 0-1 drink a day for women who are not pregnant. 0-2 drinks a day for men. Be aware of how much alcohol is in your drink. In the [place] one drink equals one 12 oz bottle of beer (355 mL), one 5 oz glass of wine (148 mL), or one 11?2 oz glass of hard liquor (44 mL). Taking care of your feet Diabetes may cause you to have poor blood circulation to your legs and feet. Because of this, taking care of your feet is very important. Diabetes can cause: The skin on the feet to get thinner, break more easily, and heal more slowly. Nerve damage  in your legs and feet, which results in decreased feeling. You may not notice minor injuries that could lead to serious problems. To avoid foot problems: Check your skin and feet every day for cuts, bruises, redness, blisters, or sores. Schedule a foot exam with your health care provider once every year. This exam includes: Inspecting the structure and skin of your feet. Checking the pulses and sensation in your feet. Make sure that your health care provider performs a visual foot exam at  every medical visit.  Taking care of your teeth People with poorly controlled diabetes are more likely to have gum (periodontal) disease. Diabetes can make periodontal diseases harder to control. If not treated, periodontal diseases can lead to tooth loss. To prevent this: Brush your teeth twice a day. Floss at least once a day. Visit your dentist 2 times a year. Managing stress Living with diabetes can be stressful. When you are experiencing stress, your blood glucose may be affected in two ways: Stress hormones may cause your blood glucose to rise. You may be distracted from taking good care of yourself. Be aware of your stress level and make changes to help you manage challenging situations. To lower your stress levels: Consider joining a support group. Do planned relaxation or meditation. Do a hobby that you enjoy. Maintain healthy relationships. Exercise regularly. Work with your health care provider or a mental health professional. Where to find more information American Diabetes Association: www.diabetes.org Association of Diabetes Care and Education Specialists: www.diabeteseducator.org Summary You can take action to prevent or slow down problems that are caused by diabetes (diabetes mellitus). Following your diabetes plan and taking care of yourself can reduce your risk of serious or life-threatening complications. Follow instructions from your health care providers about managing your diabetes. Your diabetes may be managed by a team of health care providers who can teach you how to care for yourself and can answer questions that you have. Know your target range for your blood sugar (glucose), and check your blood glucose levels as often as told. Your health care provider will help you decide how often you should check your blood glucose level depending on your treatment goals and how well you are meeting them. Your health care provider will tell you how often you need medical  visits depending on your diabetes management plan. Keep all follow-up visits as directed. This is important so possible problems can be identified early and complications can be avoided or treated. This information is not intended to replace advice given to you by your health care provider. Make sure you discuss any questions you have with your health care provider. Document Revised: 12/06/2019 Document Reviewed: 12/06/2019 Elsevier Patient Education  2022 [employer].

## 2021-10-29 NOTE — Chronic Care Management (AMB) (Addendum)
Chronic Care Management   CCM Katherine Herrera Visit Note  10/29/2021 Name: Katherine Herrera MRN: [id number] DOB: 05-26-1966  Subjective: Katherine Herrera is a 55 y.o. year old female who is a primary care patient of Plotnikov, [name], MD. The care management team was consulted for assistance with disease management and care coordination needs.    Engaged with patient by telephone for follow up visit in response to provider referral for case management and/or care coordination services.   Consent to Services:  The patient was given information about Chronic Care Management services, agreed to services, and gave verbal consent prior to initiation of services.  Please see initial visit note for detailed documentation.  Patient agreed to services and verbal consent obtained.   Assessment: Review of patient past medical history, allergies, medications, health status, including review of consultants reports, laboratory and other test data, was performed as part of comprehensive evaluation and provision of chronic care management services.   SDOH (Social Determinants of Health) assessments and interventions performed:  SDOH Interventions    Flowsheet Row Most Recent Value  SDOH Interventions   Food Insecurity Interventions Intervention Not Indicated  Transportation Interventions Intervention Not Indicated  [Continues to use insurance benefit for transportation to provider appointments,  relies on friends/ family for errand amangement]      CCM Care Plan Allergies  Allergen Reactions   Cephalexin Nausea And Vomiting   Demerol  [Meperidine Hcl] Rash   Lovastatin Other (See Comments)    Possible myalgia    Metformin And Related Nausea And Vomiting   Sulfamethoxazole-Trimethoprim     Other reaction(s): Unknown DILI, pancreatitis   Crestor [Rosuvastatin Calcium]     myalgia   Niacin Other (See Comments)    Unknown    Other Other (See Comments)   Hydromorphone Hcl Itching    Patient has been  tolerating Hydromorphone tablets without adverse effect (07/09/19) Other reaction(s): Unknown   Paroxetine Other (See Comments)    Unknown    Outpatient Encounter Medications as of 10/28/2021  Medication Sig Note   B-D INS SYR ULTRAFINE 1CC/31G 31G X 5/16" 1 ML MISC USE AS DIRECTED    baclofen (LIORESAL) 10 MG tablet TAKE 2 TABLETS BY MOUTH  TWICE DAILY AS NEEDED FOR  MUSCLE SPASMS    bisacodyl (DULCOLAX) 5 MG EC tablet Take 10 mg by mouth at bedtime.    Blood Glucose Monitoring Suppl (ONETOUCH VERIO) w/Device KIT     cholecalciferol (VITAMIN D3) 25 MCG (1000 UNIT) tablet Take 1,000 Units by mouth at bedtime.    clonazePAM (KLONOPIN) 0.5 MG tablet Take 1 tablet (0.5 mg total) by mouth 2 (two) times daily as needed for anxiety.    docusate sodium (COLACE) 100 MG capsule Take 100 mg by mouth 2 (two) times daily.    doxycycline (VIBRA-TABS) 100 MG tablet Take 1 tablet (100 mg total) by mouth 2 (two) times daily.    ezetimibe (ZETIA) 10 MG tablet TAKE 1 TABLET BY MOUTH  DAILY    fluticasone (FLONASE) 50 MCG/ACT nasal spray Place 2 sprays into both nostrils daily.    gabapentin (NEURONTIN) 800 MG tablet TAKE 1 TABLET BY MOUTH 4  TIMES DAILY    glucose blood (ONETOUCH VERIO) test strip Use to check blood sugar 2 times per day.    insulin NPH-regular Human (70-30) 100 UNIT/ML injection Inject 10 Units into the skin 2 (two) times daily with a meal. (Patient taking differently: Inject 35-60 Units into the skin as directed. 50 units in  the morning, 35 units at lunch, 60 units at dinner) 08/30/2021: 08/30/21: Reports currently taking 60 U q am; 45 Units at lunch; 70 Units at dinner    Insulin Syringe-Needle U-100 (SURE COMFORT INSULIN SYRINGE) 31G X 5/16" 0.5 ML MISC USE AS DIRECTED WITH INSULIN 4 TIMES A DAY    lamoTRIgine (LAMICTAL) 25 MG tablet TAKE 1 TABLET BY MOUTH  TWICE DAILY    Lancet Devices (ACCU-CHEK SOFTCLIX) lancets Test two times daily    Lancets (ONETOUCH DELICA PLUS [id number]) MISC      levothyroxine (SYNTHROID) 75 MCG tablet TAKE 1 TABLET BY MOUTH  DAILY    lisinopril (ZESTRIL) 10 MG tablet TAKE 1 TABLET BY MOUTH  DAILY    loratadine (CLARITIN) 10 MG tablet Take 1 tablet (10 mg total) by mouth daily as needed for allergies.    nicotine (NICODERM CQ) 21 mg/24hr patch Place 1 patch (21 mg total) onto the skin daily. Then as dirrected    ondansetron (ZOFRAN) 4 MG tablet Take 4 mg by mouth every 8 (eight) hours as needed for nausea or vomiting.    oxyCODONE (ROXICODONE) 15 MG immediate release tablet Take 1 tablet (15 mg total) by mouth every 6 (six) hours as needed.    oxyCODONE (ROXICODONE) 15 MG immediate release tablet Take 1 tablet (15 mg total) by mouth every 6 (six) hours as needed.    oxyCODONE (ROXICODONE) 15 MG immediate release tablet Take 1 tablet (15 mg total) by mouth every 6 (six) hours as needed.    rOPINIRole (REQUIP) 0.5 MG tablet TAKE 1 TABLET BY MOUTH AT  BEDTIME    senna-docusate (SENOKOT-S) 8.6-50 MG tablet Take 1 tablet by mouth 2 (two) times daily. (Patient taking differently: Take 1 tablet by mouth at bedtime.)    vitamin B-12 (CYANOCOBALAMIN) 1000 MCG tablet Take 1,000 mcg by mouth daily.    No facility-administered encounter medications on file as of 10/28/2021.   Patient Active Problem List   Diagnosis Date Noted   Thyroid nodule 09/09/2021   Statin myopathy 08/06/2021   Essential hypertension 10/09/2020   Pure hypercholesterolemia 10/09/2020   Type 2 diabetes mellitus with hyperglycemia ([place]) 10/09/2020   Chronic osteomyelitis involving right ankle and foot ([place])    Cellulitis of right lower leg 01/10/2020   Cellulitis 01/02/2020   Skin cyanosis 01/02/2020   Soft tissue infection 01/01/2020   Septic shock ([place]) 12/31/2019   Peroneal tendonitis, right    Right foot ulcer ([place]) 07/04/2019   Capsulitis of metatarsophalangeal (MTP) joint of right foot    Exostosis of bone of foot    Preop exam for internal medicine 12/19/2018   Polyneuropathy  due to type 2 diabetes mellitus ([place]) 10/01/2018   Mild renal insufficiency 09/29/2018   Hyponatremia 09/29/2018   Chronic pain 09/29/2018   Cellulitis of left lower leg    Diabetic foot ulcer ([place])    Sepsis due to cellulitis ([place]) 09/28/2018   Syncope 07/30/2018   Earache on right 08/02/2017   Cerumen impaction 08/02/2017   Constipation due to pain medication 05/22/2017   Elevated liver enzymes 05/22/2017   AKI (acute kidney injury) ([place]) 05/03/2017   Charcot foot due to diabetes mellitus ([place]) 04/23/2017   Leg swelling 10/28/2016   Knee contusion 01/13/2016   Pain from implanted hardware 01/06/2016   Cellulitis and abscess of toe of right foot 07/24/2015   Nausea without vomiting 07/24/2015   Leg edema, left 06/19/2015   DOE (dyspnea on exertion) 06/16/2015   Chest pain, atypical 06/16/2015  Family history of musculoskeletal disease 03/12/2015   Non-compliant behavior 12/24/2014   Ulcer of right foot limited to breakdown of skin ([place]) 09/02/2014   Ulcer of foot due to diabetes mellitus ([place]) 08/12/2014   Ulcer of other part of foot 06/20/2014   Leg pain, left 03/31/2014   Callus of foot 03/25/2014   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   Rash 01/28/2014   Pain in lower limb 01/24/2014   Keratosis 01/24/2014   Leg cramps 10/09/2013   Vaginitis and vulvovaginitis 10/09/2013   Ingrown nail 09/30/2013   Pain in toe of left foot 09/30/2013   Tenosynovitis of foot and ankle 09/03/2013   Metatarsal deformity, left 09/03/2013   Pain in joint, ankle and foot 01/22/2013   Pes cavus 10/13/2012   Acquired tight Achilles tendon 10/13/2012   Paronychia 06/14/2012   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 ([place]) 03/19/2012   Allergic rhinitis 03/19/2012   URI (upper respiratory infection) 08/30/2011   Myalgia 04/18/2011   SHOULDER PAIN 01/17/2011   TTS (tarsal tunnel syndrome)    GASTROENTERITIS 10/18/2010   ARTHRALGIA 01/04/2010   WEIGHT GAIN, ABNORMAL 01/04/2010    TACHYCARDIA 10/02/2009   CBC, ABNORMAL 05/01/2009   B12 deficiency 01/05/2009   GOUT 01/05/2009   Edema 01/05/2009   Anxiety disorder 07/28/2008   ELBOW PAIN 07/28/2008   TOBACCO USE DISORDER/SMOKER-SMOKING CESSATION DISCUSSED 04/21/2008   FOOT PAIN 04/21/2008   PARESTHESIA 04/21/2008   Abdominal pain 04/21/2008   SINUSITIS- ACUTE-NOS 11/13/2007   COPD exacerbation ([place]) 11/13/2007   Diabetic neuropathy ([place]) 08/27/2007   LOW BACK PAIN 08/27/2007   Dyslipidemia 08/20/2007   Hypothyroidism 05/22/2007   Conditions to be addressed/monitored:  HLD and DMII  Care Plan : Katherine Herrera Care Manager Plan of Care  Updates made by Katherine Royalty, Katherine Herrera since 10/29/2021 12:00 AM     Problem: Chronic Disease Management Needs   Priority: High     Long-Range Goal: Ongoing development of plan of care for long term chronic disease management   Start Date: 05/27/2021  Expected End Date: 05/27/2022  Priority: High  Note:   Current Barriers:  Chronic Disease Management support and education needs related to HLD and DMII Knowledge Deficits related to basic Diabetes pathophysiology and self care/management- will benefit from ongoing reinforcement and support Occasionally forgets insulin- midday dose ("especially" when family visiting) Lack of motivation to adherence to established plan of care  08/30/21: patient reports she has not been monitoring/ recording blood sugars at home: unclear reasons: "I just haven't been:"  09/13/21-- reports has now started monitoring/ recording at home 08/30/21: worsening diabetic ulceration wound to (R) foot per podiatry provider- patient placed on antibiotics 08/24/21: 09/13/21- reports wound "better;" reports took antibiotics as prescribed   RNCM Clinical Goal(s):  Patient will demonstrate Improved adherence to prescribed treatment plan for HLD and DMII as evidenced by patient reporting of adherence to blood sugar monitoring at home; taking medications as prescribed, learning  about dietary strategies for self-health management in HLD/ DMII  through collaboration with Katherine Herrera Care manager, provider, and care team.   Interventions: 1:1 collaboration with primary care provider regarding development and update of comprehensive plan of care as evidenced by provider attestation and co-signature Inter-disciplinary care team collaboration (see longitudinal plan of care) Evaluation of current treatment plan related to  self management and patient's adherence to plan as established by provider Pain assessment completed: patient denies acute/ chronic pain today Falls assessment updated: denies new/ recent falls; does not use assistive devices  Hyperlipidemia Interventions:  ON TRACK: 10/28/21   Long-Term Goal  Counseled on importance of regular laboratory monitoring as prescribed Reviewed role and benefits of statin for ASCVD risk reduction Reviewed importance of limiting foods high in cholesterol Patient reports she did receive previously (last) mailed printed educational material-- confirms she reviewed Confirmed patient continues smoking 5-6 cigarettes/ day: continues not interested in cessation  Diabetes Interventions:ON TRACK: 10/28/21   Long-Term Goal  Provided education to patient about basic DM disease process Counseled on importance of regular laboratory monitoring as prescribed Discussed plans with patient for ongoing care management follow up and provided patient with direct contact information for care management team Reviewed scheduled/upcoming provider appointments including: 11/05/21- podiatry follow up on foot wound/ ulceration; 11/15/21- PCP; reports "has scheduled" appointment with endocrinology; unable to recall today when this appointment is Review of patient status, including review of consultants reports, relevant laboratory and other test results, and medications completed Confirmed that patient has continued monitoring and recording blood sugars at home  fasting/ post-prandial- positive reinforcement provided with encouragement to continue efforts Reviewed recent blood sugar ranges at home Fasting: reports "mainly in 160-190 range" Post-prandial: reports "mainly in 200-220 range" Confirmed no recent low blood sugars at home Reinforced general goals for fasting (<150 to start, ultimately < 130) and postprandial (<160-180)-- patient requires ongoing reinforcement Reinforced previously provided education around correlation of A1-C values to home blood sugars, using personal trends: she continues to require ongoing reinforcement Reinforced with provided education around lifestyle/ dietary strategies to reduce blood sugar values: portion control, limiting carbohydrates/ sugar foods/ importance of regular exercise/ activity Confirmed continues to independently manage insulin at home: reports continues taking 60 Units q am; 45 U at lunch; and 70 U evening: denies recent changes to insulin dosing; continues independently self-managing Reinforced long-term complications/ risks of ongoing high blood sugar-- patient will continue to require ongoing reinforcement and support Reviewed recent podiatry provider office visits with patient: states "wound looks the same;" she is unable to share progress in wound healing; has no idea what the prognosis of this wound currently is- encouraged her to discuss with podiatry providers; she tells me today for the first time that she has "had this wound for over 5 years;" education provided around ongoing high blood sugar levels and delayed healing; need to get blood sugar under control for optimal healing Confirmed she received diabetic shoes recently at podiatry provider office visit-- however, she tells me she "can't wear them" because of her infected (R) foot-- states she can not wear the surgical boot on (R) foot and the diabetic shoe on the (L) foot- "it throws my walking off too much" Confirmed patient did receive  previously (last outreach) mailed "Living Well with Diabetes" booklet-- encouraged her ongoing review  Lab Results  Component Value Date   HGBA1C 8.2 (H) 05/07/2021  Patient Goals/Self-Care Activities: As evidenced by review of EHR, collaboration with care team, and patient reporting during CCM Katherine Herrera CM outreach, Patient Anaiyah [name] will: Take medications as prescribed Attend all scheduled provider appointments Call pharmacy for medication refills Call provider office for new concerns or questions Monitor and write down blood sugar several times per day: first thing in the morning before you eat and then again 2 hours after a meal, and at bedtime Your goal for fasting blood sugars at home is: less than or equal to 130 Your goal for after-eating blood sugars is: between 160-180, 2 hours after a regular sized meal Write these blood sugar values at home down so we  can review them together during our future phone calls Check blood sugar more often if I feel it is too high or too low or if I feel sick Continue to follow heart healthy, low salt, low cholesterol, carbohydrate-modified, low sugar diet-- please try to limit your portion sizes Write down questions for my endocrinologist (diabetes doctor) and talk to her about about medications like trulicity and ozempic to see if these would be good medications for you to try Keep cholesterol low by eating the right foods and continuing to take prescribed medication for high cholesterol Continue taking care of your foot wound and attending podiatry (foot doctor) office visits Continue reviewing previously provided educational material   Follow Up Plan:   Telephone follow up appointment with care management team member scheduled for:  Monday November 29, 2021 at 11:30 am The patient has been provided with contact information for the care management team and has been advised to call with any health related questions or concerns    Plan: The patient has  been provided with contact information for the care management team and has been advised to call with any health related questions or concerns  Katherine Rack, Katherine Herrera, Katherine Herrera, [place] [phone]: direct office  Medical screening examination/treatment/procedure(s) were performed by non-physician practitioner and as supervising physician I was immediately available for consultation/collaboration.  I agree with above. Lew Dawes, MD

## 2021-10-30 DIAGNOSIS — E785 Hyperlipidemia, unspecified: Secondary | ICD-10-CM | POA: Diagnosis not present

## 2021-10-30 DIAGNOSIS — E1140 Type 2 diabetes mellitus with diabetic neuropathy, unspecified: Secondary | ICD-10-CM | POA: Diagnosis not present

## 2021-11-02 ENCOUNTER — Ambulatory Visit: Payer: Medicare Other | Admitting: Podiatry

## 2021-11-05 ENCOUNTER — Other Ambulatory Visit: Payer: Self-pay

## 2021-11-05 ENCOUNTER — Ambulatory Visit (INDEPENDENT_AMBULATORY_CARE_PROVIDER_SITE_OTHER): Payer: Medicare Other | Admitting: Podiatry

## 2021-11-05 DIAGNOSIS — E039 Hypothyroidism, unspecified: Secondary | ICD-10-CM | POA: Diagnosis not present

## 2021-11-05 DIAGNOSIS — E11621 Type 2 diabetes mellitus with foot ulcer: Secondary | ICD-10-CM

## 2021-11-05 DIAGNOSIS — E1142 Type 2 diabetes mellitus with diabetic polyneuropathy: Secondary | ICD-10-CM | POA: Diagnosis not present

## 2021-11-05 DIAGNOSIS — L97411 Non-pressure chronic ulcer of right heel and midfoot limited to breakdown of skin: Secondary | ICD-10-CM

## 2021-11-05 DIAGNOSIS — E1140 Type 2 diabetes mellitus with diabetic neuropathy, unspecified: Secondary | ICD-10-CM | POA: Diagnosis not present

## 2021-11-05 DIAGNOSIS — I10 Essential (primary) hypertension: Secondary | ICD-10-CM | POA: Diagnosis not present

## 2021-11-05 DIAGNOSIS — E1165 Type 2 diabetes mellitus with hyperglycemia: Secondary | ICD-10-CM | POA: Diagnosis not present

## 2021-11-05 DIAGNOSIS — N189 Chronic kidney disease, unspecified: Secondary | ICD-10-CM | POA: Diagnosis not present

## 2021-11-05 DIAGNOSIS — E7800 Pure hypercholesterolemia, unspecified: Secondary | ICD-10-CM | POA: Diagnosis not present

## 2021-11-05 MED ORDER — SILVER SULFADIAZINE 1 % EX CREA: TOPICAL_CREAM | CUTANEOUS | 0 refills | Status: DC

## 2021-11-08 DIAGNOSIS — E1165 Type 2 diabetes mellitus with hyperglycemia: Secondary | ICD-10-CM | POA: Diagnosis not present

## 2021-11-09 DIAGNOSIS — E1165 Type 2 diabetes mellitus with hyperglycemia: Secondary | ICD-10-CM | POA: Diagnosis not present

## 2021-11-11 DIAGNOSIS — Z20822 Contact with and (suspected) exposure to covid-19: Secondary | ICD-10-CM | POA: Diagnosis not present

## 2021-11-12 DIAGNOSIS — Z20822 Contact with and (suspected) exposure to covid-19: Secondary | ICD-10-CM | POA: Diagnosis not present

## 2021-11-13 DIAGNOSIS — Z20822 Contact with and (suspected) exposure to covid-19: Secondary | ICD-10-CM | POA: Diagnosis not present

## 2021-11-15 ENCOUNTER — Ambulatory Visit: Payer: Medicare Other | Admitting: Internal Medicine

## 2021-11-15 NOTE — Progress Notes (Signed)
Subjective:  Patient ID: Katherine Herrera, female    DOB: [birth year],  MRN: [id number]  Chief Complaint  Patient presents with   Diabetic Ulcer    Follow up diabetic ulcer right foot   DOS: 05/12/21 Procedure: RT FOOT WOUND DEBRIDMENT & IRRIGATION W/POSS SKIN GRAFT SUB, PARTIAL EXCSION OF 5TH METATARSAL BASE BONE, FLEXOR TENOTOMY, ARTHROPLASTY OF 2ND TOE    56 y.o. female presents with the above complaint. History confirmed with patient. Thinks the ulcer is doing ok. Changing dressing daily.  Objective:  Physical Exam: no tenderness at the surgical site, local edema noted, and calf supple, nontender. Incision: healed wound 2nd toe  Plantar 5th met wound measuring 1* 1 cm with granular base. Moderate HPK around wound, no warmth. No probe to bone, no exposed bone Assessment:   1. DM type 2 with diabetic peripheral neuropathy ([place])   2. Diabetic ulcer of right midfoot associated with type 2 diabetes mellitus, limited to breakdown of skin ([place])    Plan:  Patient was evaluated and treated and all questions answered.  Ulcer right plantar foot -Wound cleansed and debrided -Wound slightly larger than previous. -Dressing applied consisting of silvadene and foam border dressing -Offload ulcer with surgical shoe. Hold of DM shoes right.  Procedure: Selective Debridement of Wound Rationale: Removal of devitalized tissue from the wound to promote healing.  Pre-Debridement Wound Measurements: 1 cm x 1 cm x 0.2 cm  Post-Debridement Wound Measurements: same as pre-debridement. Type of Debridement: sharp selective Instrumentation: dermal curette, tissue nipper Tissue Removed: Devitalized soft-tissue Dressing: Dry, sterile, compression dressing. Disposition: Patient tolerated procedure well.   No follow-ups on file.

## 2021-11-24 ENCOUNTER — Other Ambulatory Visit: Payer: Self-pay

## 2021-11-24 ENCOUNTER — Ambulatory Visit (INDEPENDENT_AMBULATORY_CARE_PROVIDER_SITE_OTHER): Payer: Medicare Other | Admitting: Internal Medicine

## 2021-11-24 ENCOUNTER — Encounter: Payer: Self-pay | Admitting: Internal Medicine

## 2021-11-24 DIAGNOSIS — E11621 Type 2 diabetes mellitus with foot ulcer: Secondary | ICD-10-CM | POA: Diagnosis not present

## 2021-11-24 DIAGNOSIS — F419 Anxiety disorder, unspecified: Secondary | ICD-10-CM | POA: Diagnosis not present

## 2021-11-24 DIAGNOSIS — M5440 Lumbago with sciatica, unspecified side: Secondary | ICD-10-CM

## 2021-11-24 DIAGNOSIS — E538 Deficiency of other specified B group vitamins: Secondary | ICD-10-CM | POA: Diagnosis not present

## 2021-11-24 DIAGNOSIS — L97411 Non-pressure chronic ulcer of right heel and midfoot limited to breakdown of skin: Secondary | ICD-10-CM | POA: Diagnosis not present

## 2021-11-24 DIAGNOSIS — E1140 Type 2 diabetes mellitus with diabetic neuropathy, unspecified: Secondary | ICD-10-CM | POA: Diagnosis not present

## 2021-11-24 MED ORDER — OXYCODONE HCL 15 MG PO TABS: 15.0000 mg | ORAL_TABLET | Freq: Four times a day (QID) | ORAL | 0 refills | Status: DC | PRN

## 2021-11-24 NOTE — Progress Notes (Signed)
Subjective:  Patient ID: Katherine Herrera, female    DOB: 12-Jan-1966  Age: 56 y.o. MRN: [id number]  CC: Follow-up (3 month f/u)   HPI Katherine Herrera presents for LBP, DM, HTN GYN visit, mammo is due  Outpatient Medications Prior to Visit  Medication Sig Dispense Refill   B-D INS SYR ULTRAFINE 1CC/31G 31G X 5/16" 1 ML MISC USE AS DIRECTED 100 each 0   baclofen (LIORESAL) 10 MG tablet TAKE 2 TABLETS BY MOUTH  TWICE DAILY AS NEEDED FOR  MUSCLE SPASMS 180 tablet 1   bisacodyl (DULCOLAX) 5 MG EC tablet Take 10 mg by mouth at bedtime.     Blood Glucose Monitoring Suppl (ONETOUCH VERIO) w/Device KIT      cholecalciferol (VITAMIN D3) 25 MCG (1000 UNIT) tablet Take 1,000 Units by mouth at bedtime.     clonazePAM (KLONOPIN) 0.5 MG tablet Take 1 tablet (0.5 mg total) by mouth 2 (two) times daily as needed for anxiety. 60 tablet 1   docusate sodium (COLACE) 100 MG capsule Take 100 mg by mouth 2 (two) times daily.     doxycycline (VIBRA-TABS) 100 MG tablet Take 1 tablet (100 mg total) by mouth 2 (two) times daily. 14 tablet 0   ezetimibe (ZETIA) 10 MG tablet TAKE 1 TABLET BY MOUTH  DAILY 90 tablet 3   fluticasone (FLONASE) 50 MCG/ACT nasal spray Place 2 sprays into both nostrils daily. 16 g 6   gabapentin (NEURONTIN) 800 MG tablet TAKE 1 TABLET BY MOUTH 4  TIMES DAILY 360 tablet 3   glucose blood (ONETOUCH VERIO) test strip Use to check blood sugar 2 times per day. 100 each 12   insulin NPH-regular Human (70-30) 100 UNIT/ML injection Inject 10 Units into the skin 2 (two) times daily with a meal. (Patient taking differently: Inject 35-60 Units into the skin as directed. 50 units in the morning, 35 units at lunch, 60 units at dinner) 10 mL 11   Insulin Syringe-Needle U-100 (SURE COMFORT INSULIN SYRINGE) 31G X 5/16" 0.5 ML MISC USE AS DIRECTED WITH INSULIN 4 TIMES A DAY 400 each 2   lamoTRIgine (LAMICTAL) 25 MG tablet TAKE 1 TABLET BY MOUTH  TWICE DAILY 180 tablet 3   Lancet Devices (ACCU-CHEK SOFTCLIX)  lancets Test two times daily 100 each 12   Lancets (ONETOUCH DELICA PLUS [id number]) MISC      levothyroxine (SYNTHROID) 75 MCG tablet TAKE 1 TABLET BY MOUTH  DAILY 90 tablet 3   lisinopril (ZESTRIL) 10 MG tablet TAKE 1 TABLET BY MOUTH  DAILY 90 tablet 3   loratadine (CLARITIN) 10 MG tablet Take 1 tablet (10 mg total) by mouth daily as needed for allergies. 100 tablet 3   nicotine (NICODERM CQ) 21 mg/24hr patch Place 1 patch (21 mg total) onto the skin daily. Then as dirrected 28 patch 0   ondansetron (ZOFRAN) 4 MG tablet Take 4 mg by mouth every 8 (eight) hours as needed for nausea or vomiting.     rOPINIRole (REQUIP) 0.5 MG tablet TAKE 1 TABLET BY MOUTH AT  BEDTIME 90 tablet 3   senna-docusate (SENOKOT-S) 8.6-50 MG tablet Take 1 tablet by mouth 2 (two) times daily. (Patient taking differently: Take 1 tablet by mouth at bedtime.) 20 tablet 0   silver sulfADIAZINE (SILVADENE) 1 % cream Apply pea-sized amount to wound daily. 50 g 0   vitamin B-12 (CYANOCOBALAMIN) 1000 MCG tablet Take 1,000 mcg by mouth daily.     oxyCODONE (ROXICODONE) 15 MG immediate release tablet  Take 1 tablet (15 mg total) by mouth every 6 (six) hours as needed. 120 tablet 0   oxyCODONE (ROXICODONE) 15 MG immediate release tablet Take 1 tablet (15 mg total) by mouth every 6 (six) hours as needed. 120 tablet 0   oxyCODONE (ROXICODONE) 15 MG immediate release tablet Take 1 tablet (15 mg total) by mouth every 6 (six) hours as needed. 120 tablet 0   No facility-administered medications prior to visit.    ROS: Review of Systems  Constitutional:  Negative for activity change, appetite change, chills, fatigue and unexpected weight change.  HENT:  Negative for congestion, mouth sores and sinus pressure.   Eyes:  Negative for visual disturbance.  Respiratory:  Negative for cough and chest tightness.   Gastrointestinal:  Negative for abdominal pain and nausea.  Genitourinary:  Negative for difficulty urinating, frequency and vaginal  pain.  Musculoskeletal:  Positive for arthralgias, back pain and gait problem.  Skin:  Positive for color change and wound. Negative for pallor and rash.  Neurological:  Negative for dizziness, tremors, weakness, numbness and headaches.  Psychiatric/Behavioral:  Negative for confusion and sleep disturbance.    Objective:  BP (!) 102/58 (BP Location: Left Arm)    Pulse 85    Temp 98.5 F (36.9 C) (Oral)    Ht 5' 9.5" (1.765 m)    Wt 221 lb 9.6 oz (100.5 kg)    SpO2 95%    BMI 32.26 kg/m   BP Readings from Last 3 Encounters:  11/24/21 (!) 102/58  09/09/21 108/66  08/11/21 112/60    Wt Readings from Last 3 Encounters:  11/24/21 221 lb 9.6 oz (100.5 kg)  08/11/21 218 lb 6.4 oz (99.1 kg)  05/12/21 218 lb 4.1 oz (99 kg)    Physical Exam Constitutional:      General: She is not in acute distress.    Appearance: She is well-developed. She is obese.  HENT:     Head: Normocephalic.     Right Ear: External ear normal.     Left Ear: External ear normal.     Nose: Nose normal.  Eyes:     General:        Right eye: No discharge.        Left eye: No discharge.     Conjunctiva/sclera: Conjunctivae normal.     Pupils: Pupils are equal, round, and reactive to light.  Neck:     Thyroid: No thyromegaly.     Vascular: No JVD.     Trachea: No tracheal deviation.  Cardiovascular:     Rate and Rhythm: Normal rate and regular rhythm.     Heart sounds: Normal heart sounds.  Pulmonary:     Effort: No respiratory distress.     Breath sounds: No stridor. No wheezing.  Abdominal:     General: Bowel sounds are normal. There is no distension.     Palpations: Abdomen is soft. There is no mass.     Tenderness: There is no abdominal tenderness. There is no guarding or rebound.  Musculoskeletal:        General: No tenderness.     Cervical back: Normal range of motion and neck supple. No rigidity.     Right lower leg: No edema.     Left lower leg: No edema.  Lymphadenopathy:     Cervical: No  cervical adenopathy.  Skin:    Findings: Lesion present. No erythema or rash.  Neurological:     Mental Status: She is oriented to  person, place, and time.     Cranial Nerves: No cranial nerve deficit.     Motor: No abnormal muscle tone.     Coordination: Coordination abnormal.     Gait: Gait abnormal.     Deep Tendon Reflexes: Reflexes normal.  Psychiatric:        Behavior: Behavior normal.        Thought Content: Thought content normal.        Judgment: Judgment normal.   R foot wound is dressed D surgical shoes   Lab Results  Component Value Date   WBC 9.9 05/07/2021   HGB 14.0 05/07/2021   HCT 44.2 05/07/2021   PLT 300 05/07/2021   GLUCOSE 121 (H) 05/07/2021   CHOL 223 (H) 07/19/2016   TRIG (H) 07/19/2016    527.0 Triglyceride is over 400; calculations on Lipids are invalid.   HDL 27.40 (L) 07/19/2016   LDLDIRECT 106.0 07/19/2016   LDLCALC 81 01/02/2013   ALT 16 01/27/2021   AST 16 01/27/2021   NA 138 05/07/2021   K 4.4 05/07/2021   CL 105 05/07/2021   CREATININE 0.95 05/07/2021   BUN 17 05/07/2021   CO2 27 05/07/2021   TSH 0.99 09/09/2021   INR 1.0 07/19/2019   HGBA1C 8.2 (H) 05/07/2021   MICROALBUR 0.9 07/19/2016    US THYROID  Result Date: 09/16/2021 CLINICAL DATA:  History of thyroid nodule EXAM: THYROID ULTRASOUND TECHNIQUE: Ultrasound examination of the thyroid gland and adjacent soft tissues was performed. COMPARISON:  None. FINDINGS: Parenchymal Echotexture: Mildly heterogenous Isthmus: 0.7 cm Right lobe: 5.4 x 1.7 x 1.8 cm Left lobe: 5.3 x 1.3 x 1.8 cm _________________________________________________________ Estimated total number of nodules >/= 1 cm: 1 Number of spongiform nodules >/=  2 cm not described below (TR1): 0 Number of mixed cystic and solid nodules >/= 1.5 cm not described below (TR2): 0 _________________________________________________________ Nodule # 1: Location: Isthmus; Maximum size: 1.0 cm; Other 2 dimensions: 0.8 x 0.5 cm Composition:  solid/almost completely solid (2) Echogenicity: hypoechoic (2) Shape: not taller-than-wide (0) Margins: smooth (0) Echogenic foci: none (0) ACR TI-RADS total points: 4. ACR TI-RADS risk category: TR4 (4-6 points). ACR TI-RADS recommendations: *Given size (>/= 1 - 1.4 cm) and appearance, a follow-up ultrasound in 1 year should be considered based on TI-RADS criteria. _________________________________________________________ IMPRESSION: 1. Solitary nodule in the thyroid isthmus meets criteria for follow-up ultrasound in 1 year. The above is in keeping with the ACR TI-RADS recommendations - J Am Coll Radiol 2017;14:587-595. Electronically Signed   By: Albin Felling M.D.   On: 09/16/2021 13:18    Assessment & Plan:   Problem List Items Addressed This Visit     Anxiety disorder    Cont on Clonazepam prn  Potential benefits of a long term benzodiazepines  use as well as potential risks  and complications were explained to the patient and were aknowledged.      B12 deficiency    On B12      Diabetic foot ulcer ([place])    R foot - healing F/u w/Dr March Rummage      LOW BACK PAIN    Cont on Oxycodone 15 mg  Potential benefits of a long term opioids use as well as potential risks (i.e. addiction risk, apnea etc) and complications (i.e. Somnolence, constipation and others) were explained to the patient and were aknowledged. Risks of use w/benzodiazepines discussed  Do not take w/Clonazepam      Relevant Medications   oxyCODONE (ROXICODONE) 15 MG  immediate release tablet   oxyCODONE (ROXICODONE) 15 MG immediate release tablet   oxyCODONE (ROXICODONE) 15 MG immediate release tablet   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)    Now seeing Dr Chalmers Cater         Meds ordered this encounter  Medications   oxyCODONE (ROXICODONE) 15 MG immediate release tablet    Sig: Take 1 tablet (15 mg total) by mouth every 6 (six) hours as needed.    Dispense:  120 tablet    Refill:  0    Please fill on  or after 11/24/21   oxyCODONE (ROXICODONE) 15 MG immediate release tablet    Sig: Take 1 tablet (15 mg total) by mouth every 6 (six) hours as needed.    Dispense:  120 tablet    Refill:  0    Please fill on or after 12/24/21   oxyCODONE (ROXICODONE) 15 MG immediate release tablet    Sig: Take 1 tablet (15 mg total) by mouth every 6 (six) hours as needed.    Dispense:  120 tablet    Refill:  0    Please fill on or after 01/23/22      Follow-up: No follow-ups on file.  Walker Kehr, MD

## 2021-11-24 NOTE — Assessment & Plan Note (Signed)
Now seeing Dr Chalmers Cater

## 2021-11-24 NOTE — Assessment & Plan Note (Signed)
Cont on Clonazepam prn  Potential benefits of a long term benzodiazepines  use as well as potential risks  and complications were explained to the patient and were aknowledged. 

## 2021-11-24 NOTE — Assessment & Plan Note (Signed)
R foot - healing F/u w/Dr March Rummage

## 2021-11-24 NOTE — Assessment & Plan Note (Signed)
On B12 

## 2021-11-24 NOTE — Assessment & Plan Note (Signed)
Cont on Oxycodone 15 mg  Potential benefits of a long term opioids use as well as potential risks (i.e. addiction risk, apnea etc) and complications (i.e. Somnolence, constipation and others) were explained to the patient and were aknowledged. Risks of use w/benzodiazepines discussed  Do not take w/Clonazepam

## 2021-11-27 ENCOUNTER — Observation Stay (HOSPITAL_COMMUNITY)
Admission: EM | Admit: 2021-11-27 | Discharge: 2021-11-29 | Disposition: A | Discharge status: Home / Self Care | Payer: Medicare Other | Attending: Internal Medicine | Admitting: Internal Medicine

## 2021-11-27 ENCOUNTER — Encounter (HOSPITAL_COMMUNITY): Payer: Self-pay | Admitting: *Deleted

## 2021-11-27 ENCOUNTER — Other Ambulatory Visit: Payer: Self-pay

## 2021-11-27 DIAGNOSIS — F17210 Nicotine dependence, cigarettes, uncomplicated: Secondary | ICD-10-CM | POA: Diagnosis not present

## 2021-11-27 DIAGNOSIS — R404 Transient alteration of awareness: Secondary | ICD-10-CM | POA: Diagnosis not present

## 2021-11-27 DIAGNOSIS — E162 Hypoglycemia, unspecified: Secondary | ICD-10-CM | POA: Diagnosis not present

## 2021-11-27 DIAGNOSIS — Z79899 Other long term (current) drug therapy: Secondary | ICD-10-CM | POA: Diagnosis not present

## 2021-11-27 DIAGNOSIS — E11649 Type 2 diabetes mellitus with hypoglycemia without coma: Secondary | ICD-10-CM | POA: Insufficient documentation

## 2021-11-27 DIAGNOSIS — I10 Essential (primary) hypertension: Secondary | ICD-10-CM | POA: Diagnosis not present

## 2021-11-27 DIAGNOSIS — N179 Acute kidney failure, unspecified: Secondary | ICD-10-CM | POA: Diagnosis not present

## 2021-11-27 DIAGNOSIS — Z20822 Contact with and (suspected) exposure to covid-19: Secondary | ICD-10-CM | POA: Insufficient documentation

## 2021-11-27 DIAGNOSIS — R6889 Other general symptoms and signs: Secondary | ICD-10-CM | POA: Diagnosis not present

## 2021-11-27 DIAGNOSIS — Z743 Need for continuous supervision: Secondary | ICD-10-CM | POA: Diagnosis not present

## 2021-11-27 DIAGNOSIS — N289 Disorder of kidney and ureter, unspecified: Secondary | ICD-10-CM | POA: Diagnosis not present

## 2021-11-27 DIAGNOSIS — R55 Syncope and collapse: Principal | ICD-10-CM | POA: Diagnosis present

## 2021-11-27 DIAGNOSIS — E039 Hypothyroidism, unspecified: Secondary | ICD-10-CM | POA: Insufficient documentation

## 2021-11-27 DIAGNOSIS — E860 Dehydration: Secondary | ICD-10-CM | POA: Insufficient documentation

## 2021-11-27 DIAGNOSIS — J449 Chronic obstructive pulmonary disease, unspecified: Secondary | ICD-10-CM | POA: Diagnosis not present

## 2021-11-27 DIAGNOSIS — G8929 Other chronic pain: Secondary | ICD-10-CM | POA: Diagnosis present

## 2021-11-27 DIAGNOSIS — Z794 Long term (current) use of insulin: Secondary | ICD-10-CM | POA: Diagnosis not present

## 2021-11-27 DIAGNOSIS — E1165 Type 2 diabetes mellitus with hyperglycemia: Secondary | ICD-10-CM

## 2021-11-27 DIAGNOSIS — E161 Other hypoglycemia: Secondary | ICD-10-CM | POA: Diagnosis not present

## 2021-11-27 LAB — COMPREHENSIVE METABOLIC PANEL
ALT: 17 U/L (ref 0–44)
AST: 12 U/L — ABNORMAL LOW (ref 15–41)
Albumin: 3.3 g/dL — ABNORMAL LOW (ref 3.5–5.0)
Alkaline Phosphatase: 99 U/L (ref 38–126)
Anion gap: 3 — ABNORMAL LOW (ref 5–15)
BUN: 27 mg/dL — ABNORMAL HIGH (ref 6–20)
CO2: 29 mmol/L (ref 22–32)
Calcium: 8.4 mg/dL — ABNORMAL LOW (ref 8.9–10.3)
Chloride: 103 mmol/L (ref 98–111)
Creatinine, Ser: 1.28 mg/dL — ABNORMAL HIGH (ref 0.44–1.00)
GFR, Estimated: 49 mL/min — ABNORMAL LOW (ref >60)
Glucose, Bld: 120 mg/dL — ABNORMAL HIGH (ref 70–99)
Potassium: 4.5 mmol/L (ref 3.5–5.1)
Sodium: 135 mmol/L (ref 135–145)
Total Bilirubin: 0.2 mg/dL — ABNORMAL LOW (ref 0.3–1.2)
Total Protein: 7.2 g/dL (ref 6.5–8.1)

## 2021-11-27 LAB — CBC WITH DIFFERENTIAL/PLATELET
Abs Immature Granulocytes: 0.05 10*3/uL (ref 0.00–0.07)
Basophils Absolute: 0.1 10*3/uL (ref 0.0–0.1)
Basophils Relative: 1 %
Eosinophils Absolute: 0.2 10*3/uL (ref 0.0–0.5)
Eosinophils Relative: 2 %
HCT: 42.7 % (ref 36.0–46.0)
Hemoglobin: 13.6 g/dL (ref 12.0–15.0)
Immature Granulocytes: 1 %
Lymphocytes Relative: 18 %
Lymphs Abs: 1.8 10*3/uL (ref 0.7–4.0)
MCH: 28.6 pg (ref 26.0–34.0)
MCHC: 31.9 g/dL (ref 30.0–36.0)
MCV: 89.7 fL (ref 80.0–100.0)
Monocytes Absolute: 0.8 10*3/uL (ref 0.1–1.0)
Monocytes Relative: 8 %
Neutro Abs: 7.2 10*3/uL (ref 1.7–7.7)
Neutrophils Relative %: 70 %
Platelets: 288 10*3/uL (ref 150–400)
RBC: 4.76 MIL/uL (ref 3.87–5.11)
RDW: 13.6 % (ref 11.5–15.5)
WBC: 10.1 10*3/uL (ref 4.0–10.5)
nRBC: 0 % (ref 0.0–0.2)

## 2021-11-27 LAB — ETHANOL: Alcohol, Ethyl (B): <10 mg/dL (ref <10)

## 2021-11-27 LAB — GLUCOSE, CAPILLARY
Glucose-Capillary: 243 mg/dL — ABNORMAL HIGH (ref 70–99)
Glucose-Capillary: 274 mg/dL — ABNORMAL HIGH (ref 70–99)
Glucose-Capillary: 532 mg/dL (ref 70–99)
Glucose-Capillary: 401 mg/dL — ABNORMAL HIGH (ref 70–99)

## 2021-11-27 LAB — PROCALCITONIN: Procalcitonin: <0.1 ng/mL

## 2021-11-27 LAB — RESP PANEL BY RT-PCR (FLU A&B, COVID) ARPGX2
Influenza A by PCR: NEGATIVE
Influenza B by PCR: NEGATIVE
SARS Coronavirus 2 by RT PCR: NEGATIVE

## 2021-11-27 LAB — CBG MONITORING, ED
Glucose-Capillary: 56 mg/dL — ABNORMAL LOW (ref 70–99)
Glucose-Capillary: 44 mg/dL — CL (ref 70–99)
Glucose-Capillary: 109 mg/dL — ABNORMAL HIGH (ref 70–99)

## 2021-11-27 LAB — LACTIC ACID, PLASMA
Lactic Acid, Venous: 2.3 mmol/L (ref 0.5–1.9)
Lactic Acid, Venous: 1.7 mmol/L (ref 0.5–1.9)

## 2021-11-27 MED ORDER — DEXTROSE 50 % IV SOLN: 1.0000 | Freq: Once | INTRAVENOUS | Status: DC

## 2021-11-27 MED ORDER — DEXTROSE 10 % IV SOLN: Freq: Once | INTRAVENOUS | Status: AC

## 2021-11-27 MED ORDER — DEXTROSE 50 % IV SOLN
25.0000 mL | Freq: Once | INTRAVENOUS | Status: AC
  Administered 2021-11-27: 25 mL via INTRAVENOUS

## 2021-11-27 MED ORDER — SODIUM CHLORIDE 0.9 % IV SOLN: INTRAVENOUS | Status: DC

## 2021-11-27 MED ORDER — DEXTROSE 50 % IV SOLN: 25.0000 g | Freq: Once | INTRAVENOUS | Status: AC

## 2021-11-27 MED ORDER — SODIUM CHLORIDE 0.9 % IV BOLUS
1000.0000 mL | Freq: Once | INTRAVENOUS | Status: AC
  Administered 2021-11-27: 1000 mL via INTRAVENOUS

## 2021-11-27 MED ORDER — DEXTROSE 50 % IV SOLN
INTRAVENOUS | Status: AC
  Administered 2021-11-27: 25 g via INTRAVENOUS
  Filled 2021-11-27: qty 50

## 2021-11-27 NOTE — Progress Notes (Signed)
NT checked pt cbg at 2200, results were 401mg /dL, charge nurse Sonia Baller RN and Dr. Josephine Cables notfied, no new orders at this time, will recheck in q2h from last check.

## 2021-11-27 NOTE — Progress Notes (Signed)
NT checked pt cbg at [place], results were 532mg /dL. Patient has no current orders for insulin coverage at this time. Nurse notified Dr. Josephine Cables of cbg result and concern for q2h cbg orders. Verbal order to recheck cbg in 2hrs. Charge nurse Reather Converse RN aware of situation.

## 2021-11-27 NOTE — H&P (Signed)
History and Physical    Katherine Herrera SLP:[id number] DOB: [birth year] DOA: 11/27/2021  PCP: Cassandria Anger, MD  Patient coming from: Home  Chief Complaint: Passed out  HPI: Katherine Herrera is a 56 y.o. female with medical history significant of diabetes, chronic pain, COPD who was at a basketball game when she suddenly got very weak and dizzy and passed out at the basketball game.  Patient reports she is not been sick recently.  She denies any nausea vomiting diarrhea.  She denies any fevers cough shortness of breath chest pain or lower extremity edema or swelling.  She denies any urinary symptoms.  She denies any abdominal pain.  She ate breakfast normally this morning.  She is not on any blood pressure medications.  She reports that she was hospitalized couple months ago at [facility] with a syncopal episode but did not really tell her why.  She has been eating and drinking normally.  Patient found to have a sugar in the 11Z and systolic blood pressure around 90.  She was given amp of D50 and some IV fluids.  By the time she got to the ED she was back to her baseline status.  She denies any focal neurological deficits.  She has no numbness tingling or focal weakness anywhere.  Patient be referred for admission for hypoglycemia and syncope.  Review of Systems: As per HPI otherwise 10 point review of systems negative.   Past Medical History:  Diagnosis Date   Anxiety    Arthritis    hands   B12 deficiency    Chronic constipation    Chronic pain syndrome    neck, lower back, and foot   COPD (chronic obstructive pulmonary disease) ([place])    Diabetic ulcer of right foot ([place])    Gout    possible   History of seizure    05-26-2020  per pt as child and last one in late teen's   History of sepsis    due to lower extremity cellulits 2019 left , right 03/ 2021   History of syncope    Hyperlipidemia    Hypothyroidism    Impaired range of motion of cervical spine    per pt hx cervical  fusion C4 -- 7,  has to be careful about turning her head to the right can cause her to pass out   Insulin dependent type 2 diabetes mellitus [facility])    endocrinologist--- dr Chalmers Cater    (05-26-2020 per pt check's blood sugar twice dialy and at times a third time,  fasting am blood sugar's---- 200 -- 250)   LBP (low back pain)    DR Patrice Paradise   Migraine    Peripheral neuropathy    RBBB (right bundle branch block)    Tenosynovitis of foot and ankle 09/03/2013   TTS (tarsal tunnel syndrome) 2010   Left Foot [facility] in [place]   Vitamin D deficiency    Wears glasses     Past Surgical History:  Procedure Laterality Date   ANTERIOR CERVICAL DECOMP/DISCECTOMY FUSION  02-12-2004;  07-06-2006   C4--5 in 2005;  C5 --7  in  7356   [place] Right 07/05/2019   Procedure: Application Of Wound Vac;  Surgeon: Evelina Bucy, DPM;  Location: [place];  Service: Podiatry;  Laterality: Right;   BONE BIOPSY Right 05/29/2020   Procedure: BONE BIOPSY;  Surgeon: Evelina Bucy, DPM;  Location: [place];  Service: Podiatry;  Laterality: Right;  CARDIAC CATHETERIZATION  05-27-2002   dr Einar Gip   for positive cardiolite;  normal coronaries and LVF, ef 70%   CARPAL TUNNEL RELEASE Bilateral left 12-25-2008;  right 09-22-2009  both _0    HARDWARE REMOVAL  01-07-2016  _1    right foot   INCISION AND DRAINAGE OF WOUND Right 07/20/2019   Procedure: IRRIGATION AND DEBRIDEMENT foot;  Surgeon: Evelina Bucy, DPM;  Location: [facility];  Service: Podiatry;  Laterality: Right;   INCISION AND DRAINAGE OF WOUND Right 07/22/2019   Procedure: IRRIGATION AND DEBRIDEMENT RIGHT FOOT; APPLICATION OF WOUND VAC;  Surgeon: Evelina Bucy, DPM;  Location: [facility];  Service: Podiatry;  Laterality: Right;   INCISION AND DRAINAGE OF WOUND Right 07/24/2019   Procedure: IRRIGATION AND DEBRIDEMENT WOUND, APPLICATION OF WOUND VAC;  Surgeon: Evelina Bucy, DPM;  Location: [facility];  Service: Podiatry;  Laterality: Right;    INCISION AND DRAINAGE OF WOUND Right 05/29/2020   Procedure: RIGHT FOOT WOUND DEBRIDEMENT AND IRRIGATION;  Surgeon: Evelina Bucy, DPM;  Location: [place];  Service: Podiatry;  Laterality: Right;   LUMBAR DISC SURGERY  07-07-2005   _2    L5 - S1   METATARSAL HEAD EXCISION Bilateral 01/02/2019   Procedure: Left foot 5th metatarsal resection, wound excision, adjacent tissue transfer. Right fifth metatarsal head resection, fitfth metatarsal base resection, intermediate wound repair.;  Surgeon: Evelina Bucy, DPM;  Location: [place];  Service: Podiatry;  Laterality: Bilateral;   METATARSAL HEAD EXCISION Right 05/12/2021   Procedure: Right 5th met base resection;  Surgeon: Evelina Bucy, DPM;  Location[facility];  Service: Podiatry;  Laterality: Right;   METATARSAL OSTEOTOMY Right 07/05/2019   Procedure: RIGHT FOOT 5TH METATARSAL RESECTION; PERONEAL TENDONESIS, EXCISION OF ULCER, LOCAL TISSUE TRANSFER ;  Surgeon: Evelina Bucy, DPM;  Location: [place];  Service: Podiatry;  Laterality: Right;   ORIF METATARSAL FRACTURE  09-22-2015  _3    right 5th   TOTAL ABDOMINAL HYSTERECTOMY W/ BILATERAL SALPINGOOPHORECTOMY  2000 approx.     reports that she has been smoking cigarettes. She has never used smokeless tobacco. She reports that she does not drink alcohol and does not use drugs.  Allergies  Allergen Reactions   Cephalexin Nausea And Vomiting   Demerol  [Meperidine Hcl] Rash   Lovastatin Other (See Comments)    Possible myalgia    Metformin And Related Nausea And Vomiting   Sulfamethoxazole-Trimethoprim     Other reaction(s): Unknown DILI, pancreatitis   Crestor [Rosuvastatin Calcium]     myalgia   Niacin Other (See Comments)    Unknown    Other Other (See Comments)   Hydromorphone Hcl Itching    Patient has been tolerating Hydromorphone tablets without adverse effect (07/09/19) Other reaction(s): Unknown   Paroxetine Other (See Comments)    Unknown      Family History  Problem Relation Age of Onset   Diabetes Mother    Hypertension Mother    Cancer Father 43       ? bone cancer   [name] Brother     Prior to Admission medications   Medication Sig Start Date End Date Taking? Authorizing Provider  B-D INS SYR ULTRAFINE 1CC/31G 31G X 5/16" 1 ML MISC USE AS DIRECTED 09/08/16   Renato Shin, MD  baclofen (LIORESAL) 10 MG tablet TAKE 2 TABLETS BY MOUTH  TWICE DAILY AS NEEDED FOR  MUSCLE SPASMS 09/27/21   Plotnikov, [name], MD  bisacodyl (DULCOLAX) 5 MG EC  tablet Take 10 mg by mouth at bedtime.    [provider]  Blood Glucose Monitoring Suppl (ONETOUCH VERIO) w/Device KIT  11/28/18   [provider]  cholecalciferol (VITAMIN D3) 25 MCG (1000 UNIT) tablet Take 1,000 Units by mouth at bedtime.    [provider]  clonazePAM (KLONOPIN) 0.5 MG tablet Take 1 tablet (0.5 mg total) by mouth 2 (two) times daily as needed for anxiety. 08/11/21   Plotnikov, Georgina Quint, MD  docusate sodium (COLACE) 100 MG capsule Take 100 mg by mouth 2 (two) times daily.    [provider]  doxycycline (VIBRA-TABS) 100 MG tablet Take 1 tablet (100 mg total) by mouth 2 (two) times daily. 08/24/21   Park Liter, DPM  ezetimibe (ZETIA) 10 MG tablet TAKE 1 TABLET BY MOUTH  DAILY 09/06/21   Plotnikov, Georgina Quint, MD  fluticasone (FLONASE) 50 MCG/ACT nasal spray Place 2 sprays into both nostrils daily. 04/21/20   Plotnikov, Georgina Quint, MD  gabapentin (NEURONTIN) 800 MG tablet TAKE 1 TABLET BY MOUTH 4  TIMES DAILY 06/11/21   Plotnikov, Georgina Quint, MD  glucose blood (ONETOUCH VERIO) test strip Use to check blood sugar 2 times per day. 06/24/16   Romero Belling, MD  insulin NPH-regular Human (70-30) 100 UNIT/ML injection Inject 10 Units into the skin 2 (two) times daily with a meal. Patient taking differently: Inject 35-60 Units into the skin as directed. 50 units in the morning, 35 units at lunch, 60 units at dinner 07/25/19   Noralee Stain, DO  Insulin Syringe-Needle U-100 (SURE COMFORT INSULIN SYRINGE) 31G X 5/16" 0.5 ML MISC USE AS DIRECTED WITH INSULIN 4 TIMES A DAY 09/08/17   Romero Belling, MD  lamoTRIgine (LAMICTAL) 25 MG tablet TAKE 1 TABLET BY MOUTH  TWICE DAILY 07/20/21   Plotnikov, Georgina Quint, MD  Lancet Devices (ACCU-CHEK [facility]) lancets Test two times daily 07/01/16   Romero Belling, MD  Lancets [facility] [place] PLUS [place]) MISC  11/07/19   [provider]  levothyroxine (SYNTHROID) 75 MCG tablet TAKE 1 TABLET BY MOUTH  DAILY 06/11/21   Plotnikov, Georgina Quint, MD  lisinopril (ZESTRIL) 10 MG tablet TAKE 1 TABLET BY MOUTH  DAILY 09/06/21   Plotnikov, Georgina Quint, MD  loratadine (CLARITIN) 10 MG tablet Take 1 tablet (10 mg total) by mouth daily as needed for allergies. 04/21/20   Plotnikov, Georgina Quint, MD  nicotine (NICODERM CQ) 21 mg/24hr patch Place 1 patch (21 mg total) onto the skin daily. Then as dirrected 04/28/21   Plotnikov, Georgina Quint, MD  ondansetron (ZOFRAN) 4 MG tablet Take 4 mg by mouth every 8 (eight) hours as needed for nausea or vomiting.    [provider]  oxyCODONE (ROXICODONE) 15 MG immediate release tablet Take 1 tablet (15 mg total) by mouth every 6 (six) hours as needed. 11/24/21   Plotnikov, Georgina Quint, MD  oxyCODONE (ROXICODONE) 15 MG immediate release tablet Take 1 tablet (15 mg total) by mouth every 6 (six) hours as needed. 11/24/21   Plotnikov, Georgina Quint, MD  oxyCODONE (ROXICODONE) 15 MG immediate release tablet Take 1 tablet (15 mg total) by mouth every 6 (six) hours as needed. 11/24/21   Plotnikov, Georgina Quint, MD  rOPINIRole (REQUIP) 0.5 MG tablet TAKE 1 TABLET BY MOUTH AT  BEDTIME 06/11/21   Plotnikov, Georgina Quint, MD  senna-docusate (SENOKOT-S) 8.6-50 MG tablet Take 1 tablet by mouth 2 (two) times daily. Patient taking differently: Take 1 tablet by mouth at bedtime. 07/10/19  Aline August, MD  silver sulfADIAZINE (SILVADENE) 1 % cream Apply pea-sized amount to wound daily. Patient taking  differently: Apply 1 application topically daily. 11/05/21   Evelina Bucy, DPM  vitamin B-12 (CYANOCOBALAMIN) 1000 MCG tablet Take 1,000 mcg by mouth daily.    [provider]    Physical Exam: Vitals:   11/27/21 1025 11/27/21 1030 11/27/21 1200 11/27/21 1258  BP: 102/65 99/65 102/61 125/77  Pulse: 72 71 70 76  Resp:  _0 Temp: 97.9 F (36.6 C)     TempSrc: Oral     SpO2: 95% 95% 100% 95%  Weight:      Height:          Constitutional: NAD, calm, comfortable Vitals:   11/27/21 1025 11/27/21 1030 11/27/21 1200 11/27/21 1258  BP: 102/65 99/65 102/61 125/77  Pulse: 72 71 70 76  Resp:  _1 Temp: 97.9 F (36.6 C)     TempSrc: Oral     SpO2: 95% 95% 100% 95%  Weight:      Height:       Eyes: PERRL, lids and conjunctivae normal ENMT: Mucous membranes are moist. Posterior pharynx clear of any exudate or lesions.Normal dentition.  Neck: normal, supple, no masses, no thyromegaly Respiratory: clear to auscultation bilaterally, no wheezing, no crackles. Normal respiratory effort. No accessory muscle use.  Cardiovascular: Regular rate and rhythm, no murmurs / rubs / gallops. No extremity edema. 2+ pedal pulses. No carotid bruits.  Abdomen: no tenderness, no masses palpated. No hepatosplenomegaly. Bowel sounds positive.  Musculoskeletal: no clubbing / cyanosis. No joint deformity upper and lower extremities. Good ROM, no contractures. Normal muscle tone.  Skin: no rashes, lesions, ulcers. No induration Neurologic: CN 2-12 grossly intact. Sensation intact, DTR normal. Strength 5/5 in all 4.  Psychiatric: Normal judgment and insight. Alert and oriented x 3. Normal mood.    Labs on Admission: I have personally reviewed following labs and imaging studies  CBC: Recent Labs  Lab 11/27/21 1041  WBC 10.1  NEUTROABS 7.2  HGB 13.6  HCT 42.7  MCV 89.7  PLT 098   Basic Metabolic Panel: Recent Labs  Lab 11/27/21 1041  NA 135  K 4.5  CL 103  CO2 29  GLUCOSE  120*  BUN 27*  CREATININE 1.28*  CALCIUM 8.4*   GFR: Estimated Creatinine Clearance: 63.2 mL/min (A) (by C-G formula based on SCr of 1.28 mg/dL (H)). Liver Function Tests: Recent Labs  Lab 11/27/21 1041  AST 12*  ALT 17  ALKPHOS 99  BILITOT 0.2*  PROT 7.2  ALBUMIN 3.3*   No results for input(s): LIPASE, AMYLASE in the last 168 hours. No results for input(s): AMMONIA in the last 168 hours. Coagulation Profile: No results for input(s): INR, PROTIME in the last 168 hours. Cardiac Enzymes: No results for input(s): CKTOTAL, CKMB, CKMBINDEX, TROPONINI in the last 168 hours. BNP (last 3 results) No results for input(s): PROBNP in the last 8760 hours. HbA1C: No results for input(s): HGBA1C in the last 72 hours. CBG: Recent Labs  Lab 11/27/21 1033 11/27/21 1304  GLUCAP 56* 44*   Lipid Profile: No results for input(s): CHOL, HDL, LDLCALC, TRIG, CHOLHDL, LDLDIRECT in the last 72 hours. Thyroid Function Tests: No results for input(s): TSH, T4TOTAL, FREET4, T3FREE, THYROIDAB in the last 72 hours. Anemia Panel: No results for input(s): VITAMINB12, FOLATE, FERRITIN, TIBC, IRON, RETICCTPCT in the last 72 hours. Urine analysis:    Component Value Date/Time   COLORURINE YELLOW  02/20/2019 1330   APPEARANCEUR CLEAR 02/20/2019 1330   LABSPEC 1.010 02/20/2019 1330   PHURINE 5.5 02/20/2019 1330   GLUCOSEU NEGATIVE 02/20/2019 1330   HGBUR NEGATIVE 02/20/2019 1330   BILIRUBINUR NEGATIVE 02/20/2019 1330   KETONESUR NEGATIVE 02/20/2019 [place] 09/28/2018 2318   UROBILINOGEN 0.2 02/20/2019 1330   NITRITE NEGATIVE 02/20/2019 1330   LEUKOCYTESUR NEGATIVE 02/20/2019 1330   Sepsis Labs: !!!!!!!!!!!!!!!!!!!!!!!!!!!!!!!!!!!!!!!!!!!! _0 (procalcitonin:4,lacticidven:4) )No results found for this or any previous visit (from the past 240 hour(s)).   Radiological Exams on Admission: No results found.  EKG: Independently reviewed.  Right bundle block normal sinus  rhythm old  Assessment/Plan  56 year old female with hypoglycemia and syncopal episode  Principal Problem:    Syncope--blood pressure soft with EMS arrival with a glucose of 70.  Patient reports she is not on blood pressure medications however ACE inhibitor is listed on her med list.  No fever white count normal no signs or symptoms of any active infection.  Likely secondary to orthostatics plus or minus hypoglycemia.  No focal neurological deficits.  EKG no significant arrhythmias.  Continue to monitor on telemetry monitoring.  Replete glucose and check every 2 hours.  IV fluids.  Check orthostatics.  No further work-up.  Active Problems:    Hypoglycemia-hold insulin products.  Every 2 hours glucose checks.  Give food.    Mild renal insufficiency-creatinine bump up to 1.3.  IV fluids.  Hold ACE inhibitor.  IV fluid hydration.  Avoid nephrotoxic substances.    Chronic pain-check urine drug screen.  Clarify home meds.    Essential hypertension-patient denies taking any medications for her blood pressure however she is on lisinopril.  Hold at this time.    Further recommendations pending over hospital course   DVT prophylaxis: Ambulate SCDs Code Status: Full Family Communication: None Disposition Plan: Tomorrow Consults called: None Admission status: Observation   Katherine Herrera A MD [facility]  If 7PM-7AM, please contact night-coverage [email or web address] Password [facility]  11/27/2021, 2:05 PM

## 2021-11-27 NOTE — ED Notes (Signed)
Pt given meal tray and drink.

## 2021-11-27 NOTE — ED Provider Notes (Signed)
[facility] EMERGENCY DEPARTMENT Provider Note   CSN: [id number] Arrival date & time: 11/27/21  1012     History  Chief Complaint  Patient presents with   Loss of Consciousness    Katherine Herrera is a 56 y.o. female.   Loss of Consciousness Associated symptoms: no fever    Patient has a history of hypothyroidism, chronic low back pain, COPD, migraine, hyperlipidemia, anxiety, history of sepsis and syncope, diabetes and peripheral neuropathy who presents with a syncopal episode.  Patient states she was at a basketball game today.  She suddenly started feeling lightheaded and hot.  She had a syncopal episode.  Family was able to get her to a chair.  When EMS arrived they noted that she was hypotensive.  Reportedly her blood sugar was 74 but she was given a half amp of D50.  Patient also was given IV fluids.  Patient denies any chest pain.  She denies any shortness of breath.  No abdominal pain.  No bleeding.  No headaches.  She has had this happen to her in the past.  She is chronically on opiates and benzodiazepines.  She denies taking any this morning. Home Medications Prior to Admission medications   Medication Sig Start Date End Date Taking? Authorizing Provider  B-D INS SYR ULTRAFINE 1CC/31G 31G X 5/16" 1 ML MISC USE AS DIRECTED 09/08/16   Renato Shin, MD  baclofen (LIORESAL) 10 MG tablet TAKE 2 TABLETS BY MOUTH  TWICE DAILY AS NEEDED FOR  MUSCLE SPASMS 09/27/21   Plotnikov, [name], MD  bisacodyl (DULCOLAX) 5 MG EC tablet Take 10 mg by mouth at bedtime.    [provider]  Blood Glucose Monitoring Suppl (ONETOUCH VERIO) w/Device KIT  11/28/18   [provider]  cholecalciferol (VITAMIN D3) 25 MCG (1000 UNIT) tablet Take 1,000 Units by mouth at bedtime.    [provider]  clonazePAM (KLONOPIN) 0.5 MG tablet Take 1 tablet (0.5 mg total) by mouth 2 (two) times daily as needed for anxiety. 08/11/21   Plotnikov, [name], MD  docusate sodium (COLACE)  100 MG capsule Take 100 mg by mouth 2 (two) times daily.    [provider]  doxycycline (VIBRA-TABS) 100 MG tablet Take 1 tablet (100 mg total) by mouth 2 (two) times daily. 08/24/21   Evelina Bucy, DPM  ezetimibe (ZETIA) 10 MG tablet TAKE 1 TABLET BY MOUTH  DAILY 09/06/21   Plotnikov, [name], MD  fluticasone (FLONASE) 50 MCG/ACT nasal spray Place 2 sprays into both nostrils daily. 04/21/20   Plotnikov, [name], MD  gabapentin (NEURONTIN) 800 MG tablet TAKE 1 TABLET BY MOUTH 4  TIMES DAILY 06/11/21   Plotnikov, [name], MD  glucose blood (ONETOUCH VERIO) test strip Use to check blood sugar 2 times per day. 06/24/16   Renato Shin, MD  insulin NPH-regular Human (70-30) 100 UNIT/ML injection Inject 10 Units into the skin 2 (two) times daily with a meal. Patient taking differently: Inject 35-60 Units into the skin as directed. 50 units in the morning, 35 units at lunch, 60 units at dinner 07/25/19   Dessa Phi, DO  Insulin Syringe-Needle U-100 (SURE COMFORT INSULIN SYRINGE) 31G X 5/16" 0.5 ML MISC USE AS DIRECTED WITH INSULIN 4 TIMES A DAY 09/08/17   Renato Shin, MD  lamoTRIgine (LAMICTAL) 25 MG tablet TAKE 1 TABLET BY MOUTH  TWICE DAILY 07/20/21   Plotnikov, [name], MD  Lancet Devices (ACCU-CHEK [facility]) lancets Test two times daily 07/01/16  Renato Shin, MD  Lancets (ONETOUCH DELICA PLUS [id number]) [place]  11/07/19   [provider]  levothyroxine (SYNTHROID) 75 MCG tablet TAKE 1 TABLET BY MOUTH  DAILY 06/11/21   Plotnikov, [name], MD  lisinopril (ZESTRIL) 10 MG tablet TAKE 1 TABLET BY MOUTH  DAILY 09/06/21   Plotnikov, [name], MD  loratadine (CLARITIN) 10 MG tablet Take 1 tablet (10 mg total) by mouth daily as needed for allergies. 04/21/20   Plotnikov, [name], MD  nicotine (NICODERM CQ) 21 mg/24hr patch Place 1 patch (21 mg total) onto the skin daily. Then as dirrected 04/28/21   Plotnikov, [name], MD  ondansetron (ZOFRAN) 4 MG tablet Take 4 mg by mouth every 8  (eight) hours as needed for nausea or vomiting.    [provider]  oxyCODONE (ROXICODONE) 15 MG immediate release tablet Take 1 tablet (15 mg total) by mouth every 6 (six) hours as needed. 11/24/21   Plotnikov, [name], MD  oxyCODONE (ROXICODONE) 15 MG immediate release tablet Take 1 tablet (15 mg total) by mouth every 6 (six) hours as needed. 11/24/21   Plotnikov, [name], MD  oxyCODONE (ROXICODONE) 15 MG immediate release tablet Take 1 tablet (15 mg total) by mouth every 6 (six) hours as needed. 11/24/21   Plotnikov, [name], MD  rOPINIRole (REQUIP) 0.5 MG tablet TAKE 1 TABLET BY MOUTH AT  BEDTIME 06/11/21   Plotnikov, [name], MD  senna-docusate (SENOKOT-S) 8.6-50 MG tablet Take 1 tablet by mouth 2 (two) times daily. Patient taking differently: Take 1 tablet by mouth at bedtime. 07/10/19   Aline August, MD  silver sulfADIAZINE (SILVADENE) 1 % cream Apply pea-sized amount to wound daily. Patient taking differently: Apply 1 application topically daily. 11/05/21   Evelina Bucy, DPM  vitamin B-12 (CYANOCOBALAMIN) 1000 MCG tablet Take 1,000 mcg by mouth daily.    [provider]      Allergies    Cephalexin, Demerol  [meperidine hcl], Lovastatin, Metformin and related, Sulfamethoxazole-trimethoprim, Crestor [rosuvastatin calcium], Niacin, Other, Hydromorphone hcl, and Paroxetine    Review of Systems   Review of Systems  Constitutional:  Negative for fever.  Cardiovascular:  Positive for syncope.   Physical Exam Updated Vital Signs BP 125/77 (BP Location: Left Arm)    Pulse 76    Temp 97.9 F (36.6 C) (Oral)    Resp 13    Ht 1.765 m (5' 9.5")    Wt 100.5 kg    SpO2 95%    BMI 32.26 kg/m  Physical Exam Vitals and nursing note reviewed.  Constitutional:      Appearance: She is well-developed.     Comments: Listless, but awake and answering questions appropriately  HENT:     Head: Normocephalic and atraumatic.     Right Ear: External ear normal.     Left Ear:  External ear normal.  Eyes:     General: No scleral icterus.       Right eye: No discharge.        Left eye: No discharge.     Conjunctiva/sclera: Conjunctivae normal.  Neck:     Trachea: No tracheal deviation.  Cardiovascular:     Rate and Rhythm: Normal rate and regular rhythm.  Pulmonary:     Effort: Pulmonary effort is normal. No respiratory distress.     Breath sounds: Normal breath sounds. No stridor. No wheezing or rales.  Abdominal:     General: Bowel sounds are normal. There is no distension.  Palpations: Abdomen is soft.     Tenderness: There is no abdominal tenderness. There is no guarding or rebound.  Musculoskeletal:        General: No tenderness or deformity.     Cervical back: Neck supple.  Skin:    General: Skin is warm and dry.     Findings: No rash.  Neurological:     General: No focal deficit present.     Mental Status: She is alert.     Cranial Nerves: No cranial nerve deficit (no facial droop, extraocular movements intact, no slurred speech).     Sensory: No sensory deficit.     Motor: No abnormal muscle tone or seizure activity.     Coordination: Coordination normal.     Comments: Equal grip strength bilaterally, able to lift her legs off the bed bilaterally, movements somewhat slow but is following all commands appropriately  Psychiatric:        Mood and Affect: Mood normal.    ED Results / Procedures / Treatments   Labs (all labs ordered are listed, but only abnormal results are displayed) Labs Reviewed  COMPREHENSIVE METABOLIC PANEL - Abnormal; Notable for the following components:      Result Value   Glucose, Bld 120 (*)    BUN 27 (*)    Creatinine, Ser 1.28 (*)    Calcium 8.4 (*)    Albumin 3.3 (*)    AST 12 (*)    Total Bilirubin 0.2 (*)    GFR, Estimated 49 (*)    Anion gap 3 (*)    All other components within normal limits  CBG MONITORING, ED - Abnormal; Notable for the following components:   Glucose-Capillary 56 (*)    All other  components within normal limits  CBG MONITORING, ED - Abnormal; Notable for the following components:   Glucose-Capillary 44 (*)    All other components within normal limits  RESP PANEL BY RT-PCR (FLU A&B, COVID) ARPGX2  CBC WITH DIFFERENTIAL/PLATELET  ETHANOL  RAPID URINE DRUG SCREEN, HOSP PERFORMED  POC URINE PREG, ED    EKG EKG Interpretation  Date/Time:  Saturday November 27 2021 12:56:46 EST Ventricular Rate:  71 PR Interval:  145 QRS Duration: 152 QT Interval:  408 QTC Calculation: 444 R Axis:   88 Text Interpretation: Sinus rhythm Right bundle branch block No significant change since last tracing Confirmed by Dorie Rank [phone]) on 11/27/2021 1:17:14 PM  Radiology No results found.  Procedures .Critical Care Performed by: Dorie Rank, MD Authorized by: Dorie Rank, MD   Critical care provider statement:    Critical care time (minutes):  35   Critical care was time spent personally by me on the following activities:  Development of treatment plan with patient or surrogate, discussions with consultants, evaluation of patient's response to treatment, examination of patient, ordering and review of laboratory studies, ordering and review of radiographic studies, ordering and performing treatments and interventions, pulse oximetry, re-evaluation of patient's condition and review of old charts    Medications Ordered in ED Medications  sodium chloride 0.9 % bolus 1,000 mL (0 mLs Intravenous Stopped 11/27/21 1347)    And  0.9 %  sodium chloride infusion ( Intravenous New Bag/Given 11/27/21 1324)  dextrose 50 % solution 50 mL (has no administration in time range)  dextrose 50 % solution 25 g (25 g Intravenous Given 11/27/21 1045)  dextrose 50 % solution 25 mL (25 mLs Intravenous Given 11/27/21 1315)  dextrose 10 % infusion ( Intravenous New  Bag/Given 11/27/21 1323)    ED Course/ Medical Decision Making/ A&P Clinical Course as of 11/27/21 1348  Sat Nov 27, 2021  1035 Notified  blood sugar in the 50s.  Will give d50 [JK]  1252 Ethanol Normal [JK]  1253 CBC WITH DIFFERENTIAL Normal [JK]  1253 Comprehensive metabolic panel(!) Creatinine increased at 1.28, up from 0.956 months ago [JK]  1254 Blood sugar has decreased into the 40s again.  Another amp of D50 ordered.  We will also start on a dextrose infusion. [JK]    Clinical Course User Index [JK] Dorie Rank, MD                           Medical Decision Making Amount and/or Complexity of Data Reviewed Labs: ordered. Decision-making details documented in ED Course. ECG/medicine tests: ordered.  Risk Prescription drug management. Decision regarding hospitalization.  Syncope Patient presented to the ED for evaluation of a syncopal episode.  Does have elevation in her creatinine.  Possible dehydration may have been a component.  Patient was given IV fluids and her blood pressure has stabilized.  She is not showing any signs of dysrhythmia.  We will continue to monitor  hypoglycemia Patient noted to be hypoglycemic.  She was initially given D50 by EMS and required a second dose here on arrival.  Patient has been monitored and had recurrent hypoglycemia.  Patient required additional amp of D50 and is now on a D10 infusion.  With her recurrent hypoglycemia I think she will need prolonged monitoring.  I will consult with the medical service for admission  Case discussed with Dr Shanon Brow regarding admission       Final Clinical Impression(s) / ED Diagnoses Final diagnoses:  Hypoglycemia  Dehydration  Syncope and collapse     Dorie Rank, MD 11/28/21 [phone]

## 2021-11-27 NOTE — ED Notes (Signed)
Date and time results received: 11/27/21 3:29 PM  Test: LA Critical Value: 2.3  Name of Provider Notified: Dr. Shanon Brow  Orders Received? Or Actions Taken?: see orders

## 2021-11-27 NOTE — ED Triage Notes (Signed)
Pt brought in by [facility] from her childs basketball game. Pt had a syncopal episode while at the game, family got her up to a chair. When EMS arrived pt was alert and SBP was 84, CBG 74. EMS started fluid bolus with BP now 94/70. 1/2 amp of D50 given with increase of BG to 121.

## 2021-11-27 NOTE — ED Notes (Signed)
Pt ambulated to restroom. 

## 2021-11-28 DIAGNOSIS — R55 Syncope and collapse: Secondary | ICD-10-CM | POA: Diagnosis not present

## 2021-11-28 DIAGNOSIS — E860 Dehydration: Secondary | ICD-10-CM | POA: Diagnosis not present

## 2021-11-28 LAB — GLUCOSE, CAPILLARY
Glucose-Capillary: 102 mg/dL — ABNORMAL HIGH (ref 70–99)
Glucose-Capillary: 307 mg/dL — ABNORMAL HIGH (ref 70–99)
Glucose-Capillary: 316 mg/dL — ABNORMAL HIGH (ref 70–99)
Glucose-Capillary: 325 mg/dL — ABNORMAL HIGH (ref 70–99)
Glucose-Capillary: 335 mg/dL — ABNORMAL HIGH (ref 70–99)
Glucose-Capillary: 359 mg/dL — ABNORMAL HIGH (ref 70–99)
Glucose-Capillary: 381 mg/dL — ABNORMAL HIGH (ref 70–99)
Glucose-Capillary: 71 mg/dL (ref 70–99)

## 2021-11-28 LAB — BASIC METABOLIC PANEL
Anion gap: 5 (ref 5–15)
BUN: 21 mg/dL — ABNORMAL HIGH (ref 6–20)
CO2: 28 mmol/L (ref 22–32)
Calcium: 8.6 mg/dL — ABNORMAL LOW (ref 8.9–10.3)
Chloride: 97 mmol/L — ABNORMAL LOW (ref 98–111)
Creatinine, Ser: 0.99 mg/dL (ref 0.44–1.00)
GFR, Estimated: >60 mL/min (ref >60)
Glucose, Bld: 346 mg/dL — ABNORMAL HIGH (ref 70–99)
Potassium: 4.9 mmol/L (ref 3.5–5.1)
Sodium: 130 mmol/L — ABNORMAL LOW (ref 135–145)

## 2021-11-28 LAB — HEMOGLOBIN A1C
Hgb A1c MFr Bld: 7.7 % — ABNORMAL HIGH (ref 4.8–5.6)
Mean Plasma Glucose: 174.29 mg/dL

## 2021-11-28 MED ORDER — INSULIN ASPART PROT & ASPART (70-30 MIX) 100 UNIT/ML ~~LOC~~ SUSP
40.0000 [IU] | Freq: Two times a day (BID) | SUBCUTANEOUS | Status: DC
  Filled 2021-11-28: qty 10

## 2021-11-28 MED ORDER — INSULIN ASPART 100 UNIT/ML IJ SOLN: 0.0000 [IU] | Freq: Every day | INTRAMUSCULAR | Status: DC

## 2021-11-28 MED ORDER — INSULIN ASPART PROT & ASPART (70-30 MIX) 100 UNIT/ML ~~LOC~~ SUSP: 40.0000 [IU] | Freq: Two times a day (BID) | SUBCUTANEOUS | Status: DC

## 2021-11-28 MED ORDER — MELATONIN 3 MG PO TABS
3.0000 mg | ORAL_TABLET | Freq: Every day | ORAL | Status: DC
  Administered 2021-11-28: 3 mg via ORAL
  Filled 2021-11-28: qty 1

## 2021-11-28 MED ORDER — INSULIN ASPART PROT & ASPART (70-30 MIX) 100 UNIT/ML ~~LOC~~ SUSP
15.0000 [IU] | Freq: Two times a day (BID) | SUBCUTANEOUS | Status: DC
  Administered 2021-11-28 – 2021-11-29 (×2): 15 [IU] via SUBCUTANEOUS

## 2021-11-28 MED ORDER — LAMOTRIGINE 25 MG PO TABS
25.0000 mg | ORAL_TABLET | Freq: Two times a day (BID) | ORAL | Status: DC
  Administered 2021-11-28 – 2021-11-29 (×3): 25 mg via ORAL
  Filled 2021-11-28 (×3): qty 1

## 2021-11-28 MED ORDER — OXYCODONE HCL 5 MG PO TABS
15.0000 mg | ORAL_TABLET | Freq: Four times a day (QID) | ORAL | Status: DC | PRN
  Administered 2021-11-28 – 2021-11-29 (×3): 15 mg via ORAL
  Filled 2021-11-28 (×3): qty 3

## 2021-11-28 MED ORDER — INSULIN ASPART 100 UNIT/ML IJ SOLN
0.0000 [IU] | Freq: Three times a day (TID) | INTRAMUSCULAR | Status: DC
  Administered 2021-11-28: 11 [IU] via SUBCUTANEOUS
  Administered 2021-11-28: 15 [IU] via SUBCUTANEOUS
  Administered 2021-11-29: 5 [IU] via SUBCUTANEOUS

## 2021-11-28 MED ORDER — INSULIN ASPART 100 UNIT/ML IJ SOLN: 0.0000 [IU] | Freq: Three times a day (TID) | INTRAMUSCULAR | Status: DC

## 2021-11-28 MED ORDER — OXYCODONE HCL 5 MG PO TABS
5.0000 mg | ORAL_TABLET | Freq: Once | ORAL | Status: DC
  Filled 2021-11-28: qty 1

## 2021-11-28 MED ORDER — INSULIN ASPART PROT & ASPART (70-30 MIX) 100 UNIT/ML ~~LOC~~ SUSP
20.0000 [IU] | Freq: Two times a day (BID) | SUBCUTANEOUS | Status: DC
  Administered 2021-11-28: 20 [IU] via SUBCUTANEOUS
  Filled 2021-11-28: qty 10

## 2021-11-28 MED ORDER — LEVOTHYROXINE SODIUM 75 MCG PO TABS
75.0000 ug | ORAL_TABLET | Freq: Every day | ORAL | Status: DC
  Administered 2021-11-28 – 2021-11-29 (×2): 75 ug via ORAL
  Filled 2021-11-28 (×2): qty 1

## 2021-11-28 MED ORDER — MELATONIN 5 MG PO TABS
5.0000 mg | ORAL_TABLET | Freq: Every day | ORAL | Status: DC
  Filled 2021-11-28: qty 1

## 2021-11-28 MED ORDER — CLONAZEPAM 0.5 MG PO TABS
0.5000 mg | ORAL_TABLET | Freq: Two times a day (BID) | ORAL | Status: DC | PRN
  Administered 2021-11-28: 0.5 mg via ORAL
  Filled 2021-11-28: qty 1

## 2021-11-28 NOTE — Progress Notes (Signed)
Patient request pain medicine for chronic back pain, stated she takes oxycodone at home for pain. Consulted with Dr. Josephine Cables, new orders put in place. Meds pulled to give to pt, when nurse entered room with medication, pt stated she did not need it and had already taken pain medicine from her purse. Pt retrieve bottle of oxycodone 15mg  tablets out of purse. Nurse and charge nurse Sonia Baller RN confirmed count and meds sent down to pharmacy.

## 2021-11-28 NOTE — Progress Notes (Addendum)
Progress Note    Katherine Herrera  AQT:[id number] DOB: May 18, 1966  DOA: 11/27/2021 PCP: Cassandria Anger, MD    Brief Narrative:     Medical records reviewed and are as summarized below:  Katherine Herrera is an 56 y.o. female  with medical history significant of diabetes, chronic pain, COPD who was at a basketball game when she suddenly got very weak and dizzy and passed out at the basketball game.   Found to be hypoglycemia and probably dehydrated with AKI.    Assessment/Plan:   Principal Problem:   Syncope Active Problems:   Mild renal insufficiency   Chronic pain   AKI (acute kidney injury) ([place])   Essential hypertension   Hypoglycemia     Syncope due to hypoglycemia-- -patient has been having sugars as low as 50-- she is asymptomatic usually when this happens -says during the episode she got to 45    DM type 2- uncontrolled with hyper and hypoglycemia -recent increased of 70/30 to 75-45-75 daily, was on 60-45-60 -follows with Dr. Chalmers Cater (endocrine) -? If her AKI caused her not to clear the insulin as well vs 75/45/75 being too much -suspect needs better diet control     Mild renal insufficiency- -resolved -encourage hydration    Chronic pain -resume home meds     Essential hypertension -hold all meds  Pseudohyponatremia -corrects with correction of blood sugars  obesity  Body mass index is 32.86 kg/m.   Walking well in the hallway  Family Communication/Anticipated D/C date and plan/Code Status   Home in AM if blood sugars better controlled        Medical Consultants:   None.    Subjective:   Very sleepy, did not sleep well last PM  Objective:    Vitals:   11/27/21 1537 11/27/21 2002 11/28/21 0004 11/28/21 0409  BP:  138/71 137/66 135/67  Pulse:  80 80 74  Resp:  20 20 20   Temp:  98 F (36.7 C) 98.6 F (37 C) 98.2 F (36.8 C)  TempSrc:  Oral Oral Oral  SpO2:  96% 97% 99%  Weight: 102.4 kg     Height: 5' 9.5" (1.765 m)        Intake/Output Summary (Last 24 hours) at 11/28/2021 1354 Last data filed at 11/27/2021 1700 Gross per 24 hour  Intake 471.25 ml  Output --  Net 471.25 ml   Filed Weights   11/27/21 1019 11/27/21 1537  Weight: 100.5 kg 102.4 kg    Exam:  General: Appearance:    Obese female in no acute distress     Lungs:     respirations unlabored  Heart:    Normal heart rate.             Data Reviewed:   I have personally reviewed following labs and imaging studies:  Labs: Labs show the following:   Basic Metabolic Panel: Recent Labs  Lab 11/27/21 1041 11/28/21 0822  NA 135 130*  K 4.5 4.9  CL 103 97*  CO2 29 28  GLUCOSE 120* 346*  BUN 27* 21*  CREATININE 1.28* 0.99  CALCIUM 8.4* 8.6*   GFR Estimated Creatinine Clearance: 81.5 mL/min (by C-G formula based on SCr of 0.99 mg/dL). Liver Function Tests: Recent Labs  Lab 11/27/21 1041  AST 12*  ALT 17  ALKPHOS 99  BILITOT 0.2*  PROT 7.2  ALBUMIN 3.3*   No results for input(s): LIPASE, AMYLASE in the last 168 hours. No results for input(s):  AMMONIA in the last 168 hours. Coagulation profile No results for input(s): INR, PROTIME in the last 168 hours.  CBC: Recent Labs  Lab 11/27/21 1041  WBC 10.1  NEUTROABS 7.2  HGB 13.6  HCT 42.7  MCV 89.7  PLT 288   Cardiac Enzymes: No results for input(s): CKTOTAL, CKMB, CKMBINDEX, TROPONINI in the last 168 hours. BNP (last 3 results) No results for input(s): PROBNP in the last 8760 hours. CBG: Recent Labs  Lab 11/28/21 0205 11/28/21 0406 11/28/21 0654 11/28/21 0748 11/28/21 1137  GLUCAP 335* 325* 316* 381* 307*   D-Dimer: No results for input(s): DDIMER in the last 72 hours. Hgb A1c: Recent Labs    11/28/21 0822  HGBA1C 7.7*   Lipid Profile: No results for input(s): CHOL, HDL, LDLCALC, TRIG, CHOLHDL, LDLDIRECT in the last 72 hours. Thyroid function studies: No results for input(s): TSH, T4TOTAL, T3FREE, THYROIDAB in the last 72 hours.  Invalid  input(s): FREET3 Anemia work up: No results for input(s): VITAMINB12, FOLATE, FERRITIN, TIBC, IRON, RETICCTPCT in the last 72 hours. Sepsis Labs: Recent Labs  Lab 11/27/21 1041 11/27/21 1458 11/27/21 1827  PROCALCITON  --  <0.10  --   WBC 10.1  --   --   LATICACIDVEN  --  2.3* 1.7    Microbiology Recent Results (from the past 240 hour(s))  Resp Panel by RT-PCR (Flu A&B, Covid) Nasopharyngeal Swab     Status: None   Collection Time: 11/27/21  1:47 PM   Specimen: Nasopharyngeal Swab; Nasopharyngeal(NP) swabs in vial transport medium  Result Value Ref Range Status   SARS Coronavirus 2 by RT PCR NEGATIVE NEGATIVE Final    Comment: (NOTE) SARS-CoV-2 target nucleic acids are NOT DETECTED.  The SARS-CoV-2 RNA is generally detectable in upper respiratory specimens during the acute phase of infection. The lowest concentration of SARS-CoV-2 viral copies this assay can detect is 138 copies/mL. A negative result does not preclude SARS-Cov-2 infection and should not be used as the sole basis for treatment or other patient management decisions. A negative result may occur with  improper specimen collection/handling, submission of specimen other than nasopharyngeal swab, presence of viral mutation(s) within the areas targeted by this assay, and inadequate number of viral copies(<138 copies/mL). A negative result must be combined with clinical observations, patient history, and epidemiological information. The expected result is Negative.  Fact Sheet for Patients:  EntrepreneurPulse.com.au  Fact Sheet for Healthcare Providers:  IncredibleEmployment.be  This test is no t yet approved or cleared by the [place] FDA and  has been authorized for detection and/or diagnosis of SARS-CoV-2 by FDA under an Emergency Use Authorization (EUA). This EUA will remain  in effect (meaning this test can be used) for the duration of the COVID-19 declaration under  Section 564(b)(1) of the Act, 21 U.S.C.section 360bbb-3(b)(1), unless the authorization is terminated  or revoked sooner.       Influenza A by PCR NEGATIVE NEGATIVE Final   Influenza B by PCR NEGATIVE NEGATIVE Final    Comment: (NOTE) The Xpert Xpress SARS-CoV-2/FLU/RSV plus assay is intended as an aid in the diagnosis of influenza from Nasopharyngeal swab specimens and should not be used as a sole basis for treatment. Nasal washings and aspirates are unacceptable for Xpert Xpress SARS-CoV-2/FLU/RSV testing.  Fact Sheet for Patients: EntrepreneurPulse.com.au  Fact Sheet for Healthcare Providers: IncredibleEmployment.be  This test is not yet approved or cleared by the [place] FDA and has been authorized for detection and/or diagnosis of SARS-CoV-2 by FDA under an  Emergency Use Authorization (EUA). This EUA will remain in effect (meaning this test can be used) for the duration of the COVID-19 declaration under Section 564(b)(1) of the Act, 21 U.S.C. section 360bbb-3(b)(1), unless the authorization is terminated or revoked.  Performed at [facility], [address], [place], [place] [address]     Procedures and diagnostic studies:  No results found.  Medications:    insulin aspart  0-15 Units Subcutaneous TID WC   insulin aspart  0-5 Units Subcutaneous QHS   insulin aspart protamine- aspart  40 Units Subcutaneous BID WC   lamoTRIgine  25 mg Oral BID   levothyroxine  75 mcg Oral Daily   Continuous Infusions:   LOS: 0 days   Geradine Girt  [facility]   How to contact the [facility] Attending or Consulting provider [place] or covering provider during after hours [place], for this patient?  Check the care team in [facility] and look for a) attending/consulting [facility] provider listed and b) the [facility] team listed Log into [email or web address] and use [place]'s universal password to access. If you do not have the password, please contact the  hospital operator. Locate the [facility] provider you are looking for under [facility] and page to a number that you can be directly reached. If you still have difficulty reaching the provider, please page the [facility] (Director on Call) for the Hospitalists listed on amion for assistance.  11/28/2021, 1:54 PM

## 2021-11-28 NOTE — Progress Notes (Signed)
°  Transition of Care [facility]) Screening Note   Patient Details  Name: Katherine Herrera Date of Birth: 07-Jul-1966   Transition of [facility]) CM/SW Contact:    Iona Beard, [place] Phone Number: 11/28/2021, 11:36 AM    Transition of Care Department [facility]) has reviewed patient and no TOC needs have been identified at this time. We will continue to monitor patient advancement through interdisciplinary progression rounds. If new patient transition needs arise, please place a TOC consult.

## 2021-11-28 NOTE — Care Management Obs Status (Signed)
[facility] NOTIFICATION   Patient Details  Name: Katherine Herrera MRN: [id number] Date of Birth: [birth year]   Medicare Observation Status Notification Given:  Yes    Iona Beard, [place] 11/28/2021, 10:14 AM

## 2021-11-28 NOTE — Progress Notes (Signed)
Patient alert and verbal. Ambulated to the bathroom and in the hallway. Patient reported no complaints of dizziness or being lightheaded. Patient complained of pain to back, see Mar. During the end of shift patient stated she felt anxious given PRN Klonopin.

## 2021-11-29 ENCOUNTER — Telehealth: Payer: Medicare Other

## 2021-11-29 DIAGNOSIS — E860 Dehydration: Secondary | ICD-10-CM | POA: Diagnosis not present

## 2021-11-29 DIAGNOSIS — R55 Syncope and collapse: Secondary | ICD-10-CM | POA: Diagnosis not present

## 2021-11-29 LAB — BASIC METABOLIC PANEL WITH GFR
Anion gap: 6 (ref 5–15)
BUN: 21 mg/dL — ABNORMAL HIGH (ref 6–20)
CO2: 27 mmol/L (ref 22–32)
Calcium: 8.9 mg/dL (ref 8.9–10.3)
Chloride: 102 mmol/L (ref 98–111)
Creatinine, Ser: 1.07 mg/dL — ABNORMAL HIGH (ref 0.44–1.00)
GFR, Estimated: >60 mL/min
Glucose, Bld: 123 mg/dL — ABNORMAL HIGH (ref 70–99)
Potassium: 4.2 mmol/L (ref 3.5–5.1)
Sodium: 135 mmol/L (ref 135–145)

## 2021-11-29 LAB — GLUCOSE, CAPILLARY: Glucose-Capillary: 233 mg/dL — ABNORMAL HIGH (ref 70–99)

## 2021-11-29 MED ORDER — INSULIN ASPART PROT & ASPART (70-30 MIX) 100 UNIT/ML ~~LOC~~ SUSP: 20.0000 [IU] | Freq: Two times a day (BID) | SUBCUTANEOUS | Status: DC

## 2021-11-29 MED ORDER — INSULIN NPH ISOPHANE & REGULAR (70-30) 100 UNIT/ML ~~LOC~~ SUSP: 20.0000 [IU] | Freq: Two times a day (BID) | SUBCUTANEOUS | 11 refills | Status: AC

## 2021-11-29 NOTE — Progress Notes (Signed)
Inpatient Diabetes Program Recommendations  AACE/ADA: New Consensus Statement on Inpatient Glycemic Control   Target Ranges:  Prepandial:   less than 140 mg/dL      Peak postprandial:   less than 180 mg/dL (1-2 hours)      Critically ill patients:  140 - 180 mg/dL    Latest Reference Range & Units 11/28/21 07:48 11/28/21 9:43 11/28/21 11:37 11/28/21 13:11 11/28/21 16:23 18:45 11/28/21 21:16 11/29/21 07:51 11/29/21 8:52  Glucose-Capillary 70 - 99 mg/dL 381 (H)   Novolog 15 units 307 (H)     Novolog 11 units  70/30 20 units 71      70/30 15 units 102 (H) 233 (H)   Novolog 5 units  70/30 20 units    Latest Reference Range & Units 11/28/21 08:22  Hemoglobin A1C 4.8 - 5.6 % 7.7 (H)   Review of Glycemic Control  Diabetes history: DM2 Outpatient Diabetes medications: 70/30 75 units with breakfast, 70/30 45 units with lunch, 70/30 75 units with supper Current orders for Inpatient glycemic control: 70/30 15 units BID, Novolog 0-15 units TID with meals, Novolog 0-5 units QHS  Inpatient Diabetes Program Recommendations:    Insulin: Please consider increasing 70/30 to 20 units BID (dose would provide a total of 28 units for basal and 12 units for meal coverage per day).   NOTE: Spoke with patient over the phone about diabetes and home regimen for diabetes control. Patient reports being followed by Dr. Chalmers Cater for diabetes management and currently taking Humulin 70/30 75 units with breakfast, 45 units with lunch, and 75 units with supper for diabetes control. Patient reports taking DM medications as prescribed and states she does not ever adjust dose of insulin.  Patient reports she was checking glucose 1-2 times per day up until she recently started using FreeStyle Libre CGM 3 weeks ago. Patient states that since she starting using the CGM sensor that she has been noticing she is having a lot of hypoglycemia and that she does not have any symptoms of hypoglycemia (hypoglycemia  unawareness).  Patient states that she does not currently have the reader for the [place] but she has the [place] CGM on. Patient states she does not think she has the alarms turned on the CGM reader to be alerted for glucose less than 70 or over 240 mg/dl (factory settings). Asked patient to be sure that the alarms are turned on so that the reader will alarm if her glucose gets less than 70 or over 240 mg/dl . Discussed hypoglycemia and acute complications and danger of hypoglycemia.  Patient asked how high her glucose should be allowed to get up to.  Discussed glucose goals and encouraged patient to try to keep glucose 80-180 mg/dl. 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  She states she drinks mostly Sprite Zero and tries to eat high protein meals. She states she does not eat sweets/desserts very often and that she does not eat high carb foods excessively.  Patient reports that the morning of admission, she is not sure what she ate that morning for breakfast but she had taken her insulin. Discussed eating meal within 15 minutes of taking 70/30 insulin. Encouraged patient to follow carb modified diet. Patient states that she has an appointment for follow up with Dr. Chalmers Cater but she is not sure exactly when. Patient states that she had recently called  in her glucose values to Dr. Chalmers Cater but she has not been contacted to make any changes.  Discussed A1C of 7.7% on 11/28/21. Discussed that frequent hypoglycemia can cause A1C value to be inaccurate. Discussed how 70/30 works, onset and duration. Informed patient it would be recommended that she take 70/30 twice a day (with breakfast and supper). Asked patient to contact Dr. Almetta Lovely office if her glucose is less than 70 or consistently over 200 mg/dl so she could be given instructions about dosages of insulin.  Patient verbalized understanding of information discussed  and reports no further questions at this time related to diabetes. Called Dr. Chalmers Cater and discussed patient and she advised to discharge patient on 70/30 20 units BID and have patient follow up in her office within 1 week. Sent communication to Dr. Eliseo Squires regarding recommendation of Dr. Chalmers Cater.  Thanks, Barnie Alderman, RN, MSN, CDE Diabetes Coordinator Inpatient Diabetes Program [phone] (Team Pager)

## 2021-11-29 NOTE — Discharge Summary (Signed)
Physician Discharge Summary  Katherine Herrera EYC:[id number] DOB: Oct 29, 1966 DOA: 11/27/2021  PCP: Cassandria Anger, MD  Admit date: 11/27/2021 Discharge date: 11/29/2021  Admitted From: home Discharge disposition: home   Recommendations for Outpatient Follow-Up:   Dr. Chalmers Cater recommended sending her home on 70/30 20 units BID and advise her to be seen in the office in 1 week.  - be sure to eat at least 25-30 grams of carbs when she takes 70/30 and to take with breakfast and supper. I also advised her to contact Dr. Chalmers Cater if she has any CBGs less than 70 or if glucose is consistently over 200 mg/dl. -patient encouraged to stay hydrated (not just zero sugar sodas)  Discharge Diagnosis:   Principal Problem:   Syncope Active Problems:   Mild renal insufficiency   Chronic pain   AKI (acute kidney injury) ([place])   Essential hypertension   Hypoglycemia    Discharge Condition: Improved.  Diet recommendation:  Carbohydrate-modified.  Wound care: as prior  Code status: Full.   History of Present Illness:   Katherine Herrera is a 56 y.o. female with medical history significant of diabetes, chronic pain, COPD who was at a basketball game when she suddenly got very weak and dizzy and passed out at the basketball game.  Patient reports she is not been sick recently.  She denies any nausea vomiting diarrhea.  She denies any fevers cough shortness of breath chest pain or lower extremity edema or swelling.  She denies any urinary symptoms.  She denies any abdominal pain.  She ate breakfast normally this morning.  She is not on any blood pressure medications.  She reports that she was hospitalized couple months ago at [facility] with a syncopal episode but did not really tell her why.  She has been eating and drinking normally.  Patient found to have a sugar in the 14H and systolic blood pressure around 90.  She was given amp of D50 and some IV fluids.  By the time she got to the ED she was  back to her baseline status.  She denies any focal neurological deficits.  She has no numbness tingling or focal weakness anywhere.  Patient be referred for admission for hypoglycemia and syncope.   Hospital Course by Problem:   Syncope due to hypoglycemia/hypotension-- -patient has been having sugars as low as 50-- she is asymptomatic usually when this happens -says during the episode she got to 45     DM type 2- uncontrolled with hyper and hypoglycemia -recent increased of 70/30 to 75-45-75 daily, was on 60-45-60 -follows with Dr. Chalmers Cater (endocrine) -see above recommendations -suspect diet is her biggest issue     Mild renal insufficiency- -resolved -encourage hydration     Chronic pain -resume home meds     Essential hypertension -hold all meds -BP ok off ACE-- trying to avoid hypotension and syncope-- may need very low dose ACE restarted after a BMP for renal protection   Pseudohyponatremia -corrects with correction of blood sugars -resolved   obesity  Body mass index is 32.86 kg/m.    Medical Consultants:      Discharge Exam:   Vitals:   11/28/21 1456 11/28/21 2119  BP: 124/67 138/76  Pulse: 73 73  Resp: 20 18  Temp: 98.6 F (37 C) 98.3 F (36.8 C)  SpO2: 97% 94%   Vitals:   11/28/21 0004 11/28/21 0409 11/28/21 1456 11/28/21 2119  BP: 137/66 135/67 124/67 138/76  Pulse: 80  74 73 73  Resp: '20 20 20 18  ' Temp: 98.6 F (37 C) 98.2 F (36.8 C) 98.6 F (37 C) 98.3 F (36.8 C)  TempSrc: Oral Oral Oral Oral  SpO2: 97% 99% 97% 94%  Weight:      Height:        General exam: Appears calm and comfortable.    The results of significant diagnostics from this hospitalization (including imaging, microbiology, ancillary and laboratory) are listed below for reference.     Procedures and Diagnostic Studies:   No results found.   Labs:   Basic Metabolic Panel: Recent Labs  Lab 11/27/21 1041 11/28/21 0822 11/29/21 0257  NA 135 130* 135  K 4.5  4.9 4.2  CL 103 97* 102  CO2 '29 28 27  ' GLUCOSE 120* 346* 123*  BUN 27* 21* 21*  CREATININE 1.28* 0.99 1.07*  CALCIUM 8.4* 8.6* 8.9   GFR Estimated Creatinine Clearance: 75.4 mL/min (A) (by C-G formula based on SCr of 1.07 mg/dL (H)). Liver Function Tests: Recent Labs  Lab 11/27/21 1041  AST 12*  ALT 17  ALKPHOS 99  BILITOT 0.2*  PROT 7.2  ALBUMIN 3.3*   No results for input(s): LIPASE, AMYLASE in the last 168 hours. No results for input(s): AMMONIA in the last 168 hours. Coagulation profile No results for input(s): INR, PROTIME in the last 168 hours.  CBC: Recent Labs  Lab 11/27/21 1041  WBC 10.1  NEUTROABS 7.2  HGB 13.6  HCT 42.7  MCV 89.7  PLT 288   Cardiac Enzymes: No results for input(s): CKTOTAL, CKMB, CKMBINDEX, TROPONINI in the last 168 hours. BNP: Invalid input(s): POCBNP CBG: Recent Labs  Lab 11/28/21 0748 11/28/21 1137 11/28/21 1623 11/28/21 2116 11/29/21 0751  GLUCAP 381* 307* 71 102* 233*   D-Dimer No results for input(s): DDIMER in the last 72 hours. Hgb A1c Recent Labs    11/28/21 0822  HGBA1C 7.7*   Lipid Profile No results for input(s): CHOL, HDL, LDLCALC, TRIG, CHOLHDL, LDLDIRECT in the last 72 hours. Thyroid function studies No results for input(s): TSH, T4TOTAL, T3FREE, THYROIDAB in the last 72 hours.  Invalid input(s): FREET3 Anemia work up No results for input(s): VITAMINB12, FOLATE, FERRITIN, TIBC, IRON, RETICCTPCT in the last 72 hours. Microbiology Recent Results (from the past 240 hour(s))  Resp Panel by RT-PCR (Flu A&B, Covid) Nasopharyngeal Swab     Status: None   Collection Time: 11/27/21  1:47 PM   Specimen: Nasopharyngeal Swab; Nasopharyngeal(NP) swabs in vial transport medium  Result Value Ref Range Status   SARS Coronavirus 2 by RT PCR NEGATIVE NEGATIVE Final    Comment: (NOTE) SARS-CoV-2 target nucleic acids are NOT DETECTED.  The SARS-CoV-2 RNA is generally detectable in upper respiratory specimens during  the acute phase of infection. The lowest concentration of SARS-CoV-2 viral copies this assay can detect is 138 copies/mL. A negative result does not preclude SARS-Cov-2 infection and should not be used as the sole basis for treatment or other patient management decisions. A negative result may occur with  improper specimen collection/handling, submission of specimen other than nasopharyngeal swab, presence of viral mutation(s) within the areas targeted by this assay, and inadequate number of viral copies(<138 copies/mL). A negative result must be combined with clinical observations, patient history, and epidemiological information. The expected result is Negative.  Fact Sheet for Patients:  EntrepreneurPulse.com.au  Fact Sheet for Healthcare Providers:  IncredibleEmployment.be  This test is no t yet approved or cleared by the [place] and  has been authorized for detection and/or diagnosis of SARS-CoV-2 by FDA under an Emergency Use Authorization (EUA). This EUA will remain  in effect (meaning this test can be used) for the duration of the COVID-19 declaration under Section 564(b)(1) of the Act, 21 U.S.C.section 360bbb-3(b)(1), unless the authorization is terminated  or revoked sooner.       Influenza A by PCR NEGATIVE NEGATIVE Final   Influenza B by PCR NEGATIVE NEGATIVE Final    Comment: (NOTE) The Xpert Xpress SARS-CoV-2/FLU/RSV plus assay is intended as an aid in the diagnosis of influenza from Nasopharyngeal swab specimens and should not be used as a sole basis for treatment. Nasal washings and aspirates are unacceptable for Xpert Xpress SARS-CoV-2/FLU/RSV testing.  Fact Sheet for Patients: EntrepreneurPulse.com.au  Fact Sheet for Healthcare Providers: IncredibleEmployment.be  This test is not yet approved or cleared by the [place] FDA and has been authorized for detection and/or  diagnosis of SARS-CoV-2 by FDA under an Emergency Use Authorization (EUA). This EUA will remain in effect (meaning this test can be used) for the duration of the COVID-19 declaration under Section 564(b)(1) of the Act, 21 U.S.C. section 360bbb-3(b)(1), unless the authorization is terminated or revoked.  Performed at [facility], [address], [place], [place] [address]      Discharge Instructions:   Discharge Instructions     Diet Carb Modified   Complete by: As directed    Discharge instructions   Complete by: As directed    Dr. Chalmers Cater recommends home on 70/30 20 units BID and to be seen in the office in 1 week.   -be sure to eat at least 25-30 grams of carbs when she takes 70/30 and to take with breakfast and supper.  -contact Dr. Chalmers Cater if she has any CBGs less than 70 or if glucose is consistently over 200 mg/dl   Discharge wound care:   Complete by: As directed    Resume home wound care   Increase activity slowly   Complete by: As directed       Allergies as of 11/29/2021       Reactions   Cephalexin Nausea And Vomiting   Demerol  [meperidine Hcl] Rash   Lovastatin Other (See Comments)   Possible myalgia   Metformin And Related Nausea And Vomiting   Sulfamethoxazole-trimethoprim    Other reaction(s): Unknown DILI, pancreatitis   Crestor [rosuvastatin Calcium]    myalgia   Niacin Other (See Comments)   Unknown    Other Other (See Comments)   Hydromorphone Hcl Itching   Patient has been tolerating Hydromorphone tablets without adverse effect (07/09/19) Other reaction(s): Unknown   Paroxetine Other (See Comments)   Unknown         Medication List     STOP taking these medications    doxycycline 100 MG tablet Commonly known as: VIBRA-TABS   fluticasone 50 MCG/ACT nasal spray Commonly known as: FLONASE   lisinopril 10 MG tablet Commonly known as: ZESTRIL   nicotine 21 mg/24hr patch Commonly known as: Nicoderm CQ   senna-docusate 8.6-50 MG  tablet Commonly known as: Senokot-S       TAKE these medications    accu-chek softclix lancets Test two times daily   B-D INS SYR ULTRAFINE 1CC/31G 31G X 5/16" 1 ML Misc Generic drug: Insulin Syringe-Needle U-100 USE AS DIRECTED   Insulin Syringe-Needle U-100 31G X 5/16" 0.5 ML Misc Commonly known as: Sure Comfort Insulin Syringe USE AS DIRECTED WITH INSULIN 4 TIMES A DAY  baclofen 10 MG tablet Commonly known as: LIORESAL TAKE 2 TABLETS BY MOUTH  TWICE DAILY AS NEEDED FOR  MUSCLE SPASMS What changed: See the new instructions.   bisacodyl 5 MG EC tablet Commonly known as: DULCOLAX Take 10 mg by mouth at bedtime.   cholecalciferol 25 MCG (1000 UNIT) tablet Commonly known as: VITAMIN D3 Take 1,000 Units by mouth at bedtime.   clonazePAM 0.5 MG tablet Commonly known as: KLONOPIN Take 1 tablet (0.5 mg total) by mouth 2 (two) times daily as needed for anxiety.   docusate sodium 100 MG capsule Commonly known as: COLACE Take 100 mg by mouth daily.   ezetimibe 10 MG tablet Commonly known as: ZETIA TAKE 1 TABLET BY MOUTH  DAILY   gabapentin 800 MG tablet Commonly known as: NEURONTIN TAKE 1 TABLET BY MOUTH 4  TIMES DAILY   glucose blood test strip Commonly known as: OneTouch Verio Use to check blood sugar 2 times per day.   insulin NPH-regular Human (70-30) 100 UNIT/ML injection Inject 20 Units into the skin 2 (two) times daily with a meal. What changed: how much to take   lamoTRIgine 25 MG tablet Commonly known as: LAMICTAL TAKE 1 TABLET BY MOUTH  TWICE DAILY   levothyroxine 75 MCG tablet Commonly known as: SYNTHROID TAKE 1 TABLET BY MOUTH  DAILY   loratadine 10 MG tablet Commonly known as: CLARITIN Take 1 tablet (10 mg total) by mouth daily as needed for allergies.   ondansetron 4 MG tablet Commonly known as: ZOFRAN Take 4 mg by mouth every 8 (eight) hours as needed for nausea or vomiting.   OneTouch Delica Plus [id number] Misc   OneTouch Verio  w/Device Kit   oxyCODONE 15 MG immediate release tablet Commonly known as: ROXICODONE Take 1 tablet (15 mg total) by mouth every 6 (six) hours as needed. What changed: Another medication with the same name was removed. Continue taking this medication, and follow the directions you see here.   rOPINIRole 0.5 MG tablet Commonly known as: REQUIP TAKE 1 TABLET BY MOUTH AT  BEDTIME   silver sulfADIAZINE 1 % cream Commonly known as: Silvadene Apply pea-sized amount to wound daily. What changed:  how much to take how to take this when to take this additional instructions   vitamin B-12 1000 MCG tablet Commonly known as: CYANOCOBALAMIN Take 1,000 mcg by mouth daily.               Discharge Care Instructions  (From admission, onward)           Start     Ordered   11/29/21 0000  Discharge wound care:       Comments: Resume home wound care   11/29/21 1013              Time coordinating discharge: 35  Signed:  Geradine Girt DO  [facility] 11/29/2021, 10:13 AM

## 2021-11-30 ENCOUNTER — Other Ambulatory Visit: Payer: Self-pay | Admitting: Podiatry

## 2021-11-30 ENCOUNTER — Ambulatory Visit: Payer: Commercial Managed Care - HMO | Admitting: Podiatry

## 2021-12-02 ENCOUNTER — Telehealth: Payer: Self-pay | Admitting: *Deleted

## 2021-12-02 NOTE — Chronic Care Management (AMB) (Signed)
°  Care Management   Note  12/02/2021 Name: Katherine Herrera MRN: [id number] DOB: 1966/02/04  Katherine Herrera is a 56 y.o. year old female who is a primary care patient of Plotnikov, [name], MD and is actively engaged with the care management team. I reached out to [name] by phone today to assist with scheduling a follow up visit with the RN Case Manager  Follow up plan: Unsuccessful telephone outreach attempt made. The care management team will reach out to the patient again over the next 2 days. If patient returns call to provider office, please advise to call [place] at [phone].  [place] Management  Direct Dial: [phone]

## 2021-12-03 NOTE — Chronic Care Management (AMB) (Signed)
°  Care Management   Note  12/03/2021 Name: Katherine Herrera MRN: [id number] DOB: 12-22-1965  Katherine Herrera is a 56 y.o. year old female who is a primary care patient of Plotnikov, [name], MD and is actively engaged with the care management team. I reached out to [name] by phone today to assist with re-scheduling a follow up visit with the RN Case Manager  Follow up plan: A telephone outreach attempt made. Patient states she does not feel like scheduling call with RNCM call back.  The care management team will reach out to the patient again over the next 7 days. If patient returns call to provider office, please advise to call [place] at [phone].  [place] Management  Direct Dial: [phone]

## 2021-12-09 DIAGNOSIS — E1165 Type 2 diabetes mellitus with hyperglycemia: Secondary | ICD-10-CM | POA: Diagnosis not present

## 2021-12-10 NOTE — Chronic Care Management (AMB) (Signed)
°  Care Management   Note  12/10/2021 Name: Katherine Herrera MRN: [id number] DOB: Jul 10, 1966  Katherine Herrera is a 56 y.o. year old female who is a primary care patient of Plotnikov, [name], MD and is actively engaged with the care management team. I reached out to [name] by phone today to assist with re-scheduling a follow up visit with the RN Case Manager  Follow up plan: Telephone appointment with care management team member scheduled for:12/24/21  [place] Management  Direct Dial: [phone]

## 2021-12-17 ENCOUNTER — Other Ambulatory Visit: Payer: Self-pay

## 2021-12-17 ENCOUNTER — Ambulatory Visit (INDEPENDENT_AMBULATORY_CARE_PROVIDER_SITE_OTHER): Payer: Medicare Other | Admitting: Podiatry

## 2021-12-17 ENCOUNTER — Ambulatory Visit: Payer: Medicaid Other | Admitting: Podiatry

## 2021-12-17 DIAGNOSIS — L97412 Non-pressure chronic ulcer of right heel and midfoot with fat layer exposed: Secondary | ICD-10-CM | POA: Diagnosis not present

## 2021-12-17 DIAGNOSIS — E1142 Type 2 diabetes mellitus with diabetic polyneuropathy: Secondary | ICD-10-CM

## 2021-12-17 DIAGNOSIS — I999 Unspecified disorder of circulatory system: Secondary | ICD-10-CM

## 2021-12-21 NOTE — Progress Notes (Signed)
Subjective:  Patient ID: Katherine Herrera, female    DOB: Jun 14, 1966,  MRN: [id number]  Chief Complaint  Patient presents with   Diabetic Ulcer    Right foot wound care    DOS: 05/12/21 Procedure: RT FOOT WOUND DEBRIDMENT & IRRIGATION W/POSS SKIN GRAFT SUB, PARTIAL EXCSION OF 5TH METATARSAL BASE BONE, FLEXOR TENOTOMY, ARTHROPLASTY OF 2ND TOE    56 y.o. female presents with the above complaint.  Patient presents with follow-up to right submetatarsal 5 ulceration with fat layer exposed.  Patient states she is doing okay.  The wound looks about the same.  She is known to Dr. March Rummage.  She has not seen anyone else prior to hand.  She would like to discuss next treatment plan  Objective:  Physical Exam: no tenderness at the surgical site, local edema noted, and calf supple, nontender.  Nonpalpable DP PT bilaterally.  Diminished cap refill.  No hair growth noted. Incision: healed wound 2nd toe  Plantar 5th met wound measuring 1* 1 cm with granular base. Moderate HPK around wound, no warmth. No probe to bone, no exposed bone Assessment:   1. Vascular abnormality   2. DM type 2 with diabetic peripheral neuropathy ([place])   3. Diabetic ulcer of right midfoot associated with type 2 diabetes mellitus, with fat layer exposed ([place])    Plan:  Patient was evaluated and treated and all questions answered.  Ulcer right plantar foot with fat layer exposed submetatarsal 5 -Wound cleansed and debrided -Wound slightly larger than previous. -I discussed with the patient to do Betadine wet-to-dry dressing change -Offload ulcer with surgical shoe. Hold of DM shoes right. -Also believe she will benefit from a total contact cast cam boot.  I will plan on ordering it through [facility]    Vascular abnormality -ABIs PVRs were ordered to assess the vascular flow to the lower extremity.  Procedure: Selective Debridement of Wound Rationale: Removal of devitalized tissue from the wound to promote healing.   Pre-Debridement Wound Measurements: 1 centimeters cm x 1 cm x 0.2 cm  Post-Debridement Wound Measurements: same as pre-debridement. Type of Debridement: sharp selective Instrumentation: dermal curette, tissue nipper Tissue Removed: Devitalized soft-tissue Dressing: Dry, sterile, compression dressing. Disposition: Patient tolerated procedure well.   No follow-ups on file.

## 2021-12-23 ENCOUNTER — Telehealth: Payer: Self-pay | Admitting: *Deleted

## 2021-12-23 NOTE — Telephone Encounter (Signed)
Katherine Herrera w/ [facility] is calling to let the physician know that the stat order for patient cannot  to be scheduled until March 3rd due to the patient not having transportation.

## 2021-12-23 NOTE — Telephone Encounter (Signed)
Notified Damita Dunnings w/ [facility]

## 2021-12-24 ENCOUNTER — Encounter: Payer: Self-pay | Admitting: *Deleted

## 2021-12-24 ENCOUNTER — Telehealth: Payer: Self-pay | Admitting: *Deleted

## 2021-12-24 ENCOUNTER — Telehealth: Payer: Medicare Other

## 2021-12-24 NOTE — Telephone Encounter (Signed)
°  Chronic Care Management   Follow Up Note   12/24/2021 Name: Katherine Herrera MRN: [id number] DOB: [birth year]  Referred by: Cassandria Anger, MD Reason for referral : Chronic Care Management (CCM RN CM Follow Up Outreach Attempt- second unsuccessful attempt)  A second unsuccessful telephone outreach was attempted today. The patient was referred to the case management team for assistance with care management and care coordination.   Follow Up Plan:  A HIPPA compliant phone message was left for the patient providing contact information and requesting a return call Will place request with scheduling care guide to contact patient to re-schedule today's missed CCM RN follow up telephone appointment if I do not hear back from patient by end of day  Oneta Rack, RN, BSN, [place] [phone]: direct office

## 2021-12-30 ENCOUNTER — Ambulatory Visit (INDEPENDENT_AMBULATORY_CARE_PROVIDER_SITE_OTHER): Payer: Medicare Other

## 2021-12-30 DIAGNOSIS — Z0000 Encounter for general adult medical examination without abnormal findings: Secondary | ICD-10-CM | POA: Diagnosis not present

## 2021-12-30 DIAGNOSIS — Z1211 Encounter for screening for malignant neoplasm of colon: Secondary | ICD-10-CM | POA: Diagnosis not present

## 2021-12-30 NOTE — Progress Notes (Addendum)
I connected with Katherine Herrera today by telephone and verified that I am speaking with the correct person using two identifiers. Location patient: home Location provider: work Persons participating in the virtual visit: patient, provider.   I discussed the limitations, risks, security and privacy concerns of performing an evaluation and management service by telephone and the availability of in person appointments. I also discussed with the patient that there may be a patient responsible charge related to this service. The patient expressed understanding and verbally consented to this telephonic visit.    Interactive audio and video telecommunications were attempted between this provider and patient, however failed, due to patient having technical difficulties OR patient did not have access to video capability.  We continued and completed visit with audio only.  Some vital signs may be absent or patient reported.   Time Spent with patient on telephone encounter: 40 minutes  Subjective:   Katherine Herrera is a 56 y.o. female who presents for Medicare Annual (Subsequent) preventive examination.  Review of Systems     Cardiac Risk Factors include: diabetes mellitus;dyslipidemia;family history of premature cardiovascular disease;hypertension;obesity (BMI >30kg/m2);sedentary lifestyle;smoking/ tobacco exposure     Objective:    There were no vitals filed for this visit. There is no height or weight on file to calculate BMI.  Advanced Directives 12/30/2021 11/27/2021 11/27/2021 05/27/2021 05/07/2021 01/27/2021 12/22/2020  Does Patient Have a Medical Advance Directive? _0  No No  Would patient like information on creating a medical advance directive? No - Patient declined No - Patient declined No - Patient declined No - Patient declined - No - Patient declined No - Patient declined  Pre-existing out of facility DNR order (yellow form or pink MOST form) - - - - - - -    Current Medications  (verified) Outpatient Encounter Medications as of 12/30/2021  Medication Sig   B-D INS SYR ULTRAFINE 1CC/31G 31G X 5/16" 1 ML MISC USE AS DIRECTED   baclofen (LIORESAL) 10 MG tablet TAKE 2 TABLETS BY MOUTH  TWICE DAILY AS NEEDED FOR  MUSCLE SPASMS (Patient taking differently: Take 20 mg by mouth 2 (two) times daily.)   bisacodyl (DULCOLAX) 5 MG EC tablet Take 10 mg by mouth at bedtime.   Blood Glucose Monitoring Suppl (ONETOUCH VERIO) w/Device KIT    cholecalciferol (VITAMIN D3) 25 MCG (1000 UNIT) tablet Take 1,000 Units by mouth at bedtime.   clonazePAM (KLONOPIN) 0.5 MG tablet Take 1 tablet (0.5 mg total) by mouth 2 (two) times daily as needed for anxiety.   docusate sodium (COLACE) 100 MG capsule Take 100 mg by mouth daily.   ezetimibe (ZETIA) 10 MG tablet TAKE 1 TABLET BY MOUTH  DAILY   gabapentin (NEURONTIN) 800 MG tablet TAKE 1 TABLET BY MOUTH 4  TIMES DAILY   glucose blood (ONETOUCH VERIO) test strip Use to check blood sugar 2 times per day.   insulin NPH-regular Human (70-30) 100 UNIT/ML injection Inject 20 Units into the skin 2 (two) times daily with a meal.   Insulin Syringe-Needle U-100 (SURE COMFORT INSULIN SYRINGE) 31G X 5/16" 0.5 ML MISC USE AS DIRECTED WITH INSULIN 4 TIMES A DAY   lamoTRIgine (LAMICTAL) 25 MG tablet TAKE 1 TABLET BY MOUTH  TWICE DAILY   Lancet Devices (ACCU-CHEK SOFTCLIX) lancets Test two times daily   Lancets (ONETOUCH DELICA PLUS [id number]) MISC    levothyroxine (SYNTHROID) 75 MCG tablet TAKE 1 TABLET BY MOUTH  DAILY   loratadine (CLARITIN) 10 MG tablet Take  1 tablet (10 mg total) by mouth daily as needed for allergies.   ondansetron (ZOFRAN) 4 MG tablet Take 4 mg by mouth every 8 (eight) hours as needed for nausea or vomiting.   oxyCODONE (ROXICODONE) 15 MG immediate release tablet Take 1 tablet (15 mg total) by mouth every 6 (six) hours as needed.   rOPINIRole (REQUIP) 0.5 MG tablet TAKE 1 TABLET BY MOUTH AT  BEDTIME   silver sulfADIAZINE (SILVADENE) 1 % cream  APPLY PEA-SIZED AMOUNT TO WOUND DAILY.   vitamin B-12 (CYANOCOBALAMIN) 1000 MCG tablet Take 1,000 mcg by mouth daily.   No facility-administered encounter medications on file as of 12/30/2021.    Allergies (verified) Cephalexin, Demerol  [meperidine hcl], Lovastatin, Metformin and related, Sulfamethoxazole-trimethoprim, Crestor [rosuvastatin calcium], Niacin, Other, Hydromorphone hcl, and Paroxetine   History: Past Medical History:  Diagnosis Date   Anxiety    Arthritis    hands   B12 deficiency    Chronic constipation    Chronic pain syndrome    neck, lower back, and foot   COPD (chronic obstructive pulmonary disease) ([place])    Diabetic ulcer of right foot ([place])    Gout    possible   History of seizure    05-26-2020  per pt as child and last one in late teen's   History of sepsis    due to lower extremity cellulits 2019 left , right 03/ 2021   History of syncope    Hyperlipidemia    Hypothyroidism    Impaired range of motion of cervical spine    per pt hx cervical fusion C4 -- 7,  has to be careful about turning her head to the right can cause her to pass out   Insulin dependent type 2 diabetes mellitus [facility])    endocrinologist--- dr Chalmers Cater    (05-26-2020 per pt check's blood sugar twice dialy and at times a third time,  fasting am blood sugar's---- 200 -- 250)   LBP (low back pain)    DR Patrice Paradise   Migraine    Peripheral neuropathy    RBBB (right bundle branch block)    Tenosynovitis of foot and ankle 09/03/2013   TTS (tarsal tunnel syndrome) 2010   Left Foot [facility] in [place]   Vitamin D deficiency    Wears glasses    Past Surgical History:  Procedure Laterality Date   ANTERIOR CERVICAL DECOMP/DISCECTOMY FUSION  02-12-2004;  07-06-2006   C4--5 in 2005;  C5 --7  in  8101   [place] Right 07/05/2019   Procedure: Application Of Wound Vac;  Surgeon: Evelina Bucy, DPM;  Location: [place];  Service: Podiatry;  Laterality: Right;   BONE BIOPSY Right 05/29/2020    Procedure: BONE BIOPSY;  Surgeon: Evelina Bucy, DPM;  Location: [place];  Service: Podiatry;  Laterality: Right;   CARDIAC CATHETERIZATION  05-27-2002   dr Einar Gip   for positive cardiolite;  normal coronaries and LVF, ef 70%   CARPAL TUNNEL RELEASE Bilateral left 12-25-2008;  right 09-22-2009  both _0    HARDWARE REMOVAL  01-07-2016  _1    right foot   INCISION AND DRAINAGE OF WOUND Right 07/20/2019   Procedure: IRRIGATION AND DEBRIDEMENT foot;  Surgeon: Evelina Bucy, DPM;  Location[facility];  Service: Podiatry;  Laterality: Right;   INCISION AND DRAINAGE OF WOUND Right 07/22/2019   Procedure: IRRIGATION AND DEBRIDEMENT RIGHT FOOT; APPLICATION OF WOUND VAC;  Surgeon: Evelina Bucy, DPM;  Location: [facility];  Service: Podiatry;  Laterality: Right;   INCISION AND DRAINAGE OF WOUND Right 07/24/2019   Procedure: IRRIGATION AND DEBRIDEMENT WOUND, APPLICATION OF WOUND VAC;  Surgeon: Evelina Bucy, DPM;  Location[facility];  Service: Podiatry;  Laterality: Right;   INCISION AND DRAINAGE OF WOUND Right 05/29/2020   Procedure: RIGHT FOOT WOUND DEBRIDEMENT AND IRRIGATION;  Surgeon: Evelina Bucy, DPM;  Location: [place];  Service: Podiatry;  Laterality: Right;   LUMBAR DISC SURGERY  07-07-2005   _0    L5 - S1   METATARSAL HEAD EXCISION Bilateral 01/02/2019   Procedure: Left foot 5th metatarsal resection, wound excision, adjacent tissue transfer. Right fifth metatarsal head resection, fitfth metatarsal base resection, intermediate wound repair.;  Surgeon: Evelina Bucy, DPM;  Location: [place];  Service: Podiatry;  Laterality: Bilateral;   METATARSAL HEAD EXCISION Right 05/12/2021   Procedure: Right 5th met base resection;  Surgeon: Evelina Bucy, DPM;  Location[facility];  Service: Podiatry;  Laterality: Right;   METATARSAL OSTEOTOMY Right 07/05/2019   Procedure: RIGHT FOOT 5TH METATARSAL RESECTION; PERONEAL TENDONESIS, EXCISION OF ULCER,  LOCAL TISSUE TRANSFER ;  Surgeon: Evelina Bucy, DPM;  Location: [place];  Service: Podiatry;  Laterality: Right;   ORIF METATARSAL FRACTURE  09-22-2015  _1    right 5th   TOTAL ABDOMINAL HYSTERECTOMY W/ BILATERAL SALPINGOOPHORECTOMY  2000 approx.   Family History  Problem Relation Age of Onset   Diabetes Mother    Hypertension Mother    Cancer Father 24       ? bone cancer   [name] Brother    Social History   Socioeconomic History   Marital status: Married    Spouse name: Not on file   Number of children: Not on file   Years of education: Not on file   Highest education level: Not on file  Occupational History   Not on file  Tobacco Use   Smoking status: Every Day    Years: 10.00    Types: Cigarettes   Smokeless tobacco: Never   Tobacco comments:    Trying to quit, smoking maybe 1 a day  Vaping Use   Vaping Use: Never used  Substance and Sexual Activity   Alcohol use: No    Alcohol/week: 0.0 standard drinks   Drug use: No   Sexual activity: Never  Other Topics Concern   Not on file  Social History Narrative   GYN - Dr Radene Knee      FAMILY HISTORY   Lung cancer   Mother w/alcoholism      Married 3d GS due Nov   Diet Coke: 10 cans a day      Regular exercise - NO      Former smoker 2009 restared 2010 stopped 12/2009   Social Determinants of Radio broadcast assistant Strain: Low Risk    Difficulty of Paying Living Expenses: Not hard at all  Food Insecurity: No Food Insecurity   Worried About Charity fundraiser in the Last Year: Never true   Arboriculturist in the Last Year: Never true  Transportation Needs: No Transportation Needs   Lack of Transportation (Medical): No   Lack of Transportation (Non-Medical): No  Physical Activity: Insufficiently Active   Days of Exercise per Week: 7 days   Minutes of Exercise per Session: 20 min  Stress: No Stress Concern Present   Feeling of Stress : Not at all  Social Connections: Moderately Isolated  Frequency of Communication with Friends and Family: More than three times a week   Frequency of Social Gatherings with Friends and Family: Once a week   Attends Religious Services: Never   Marine scientist or Organizations: No   Attends Music therapist: Never   Marital Status: Married    Tobacco Counseling Ready to quit: Not Answered Counseling given: Not Answered Tobacco comments: Trying to quit, smoking maybe 1 a day   Clinical Intake:  Pre-visit preparation completed: Yes  Pain : No/denies pain     Nutritional Risks: None Diabetes: Yes CBG done?: No Did pt. bring in CBG monitor from home?: No  How often do you need to have someone help you when you read instructions, pamphlets, or other written materials from your doctor or pharmacy?: 1 - Never What is the last grade level you completed in school?: HSG  Diabetic? yes     Information entered by :: Lisette Abu, LPN   Activities of Daily Living In your present state of health, do you have any difficulty performing the following activities: 12/30/2021 11/27/2021  Hearing? N N  Vision? N N  Difficulty concentrating or making decisions? N N  Walking or climbing stairs? Y N  Dressing or bathing? N N  Doing errands, shopping? N N  Preparing Food and eating ? N -  Using the Toilet? N -  In the past six months, have you accidently leaked urine? N -  Do you have problems with loss of bowel control? N -  Managing your Medications? N -  Managing your Finances? N -  Housekeeping or managing your Housekeeping? N -  Some recent data might be hidden    Patient Care Team: Plotnikov, [name], MD as PCP - General Arvella Nigh, MD as Consulting Physician (Obstetrics and Gynecology) Evelina Bucy, DPM as Consulting Physician (Podiatry) Jacelyn Pi, MD as Referring Physician (Endocrinology) Knox Royalty, RN as Case Manager  Indicate any recent Medical Services you may have received from  other than [facility] providers in the past year (date may be approximate).     Assessment:   This is a routine wellness examination for Katherine Herrera.  Hearing/Vision screen Hearing Screening - Comments:: Patient denied any hearing difficulty.   No hearing aids.  Vision Screening - Comments:: Patient wears corrective glasses/contacts.  Eye exam done annually by: [facility].  Dietary issues and exercise activities discussed: Current Exercise Habits: Home exercise routine, Type of exercise: walking, Time (Minutes): 25, Frequency (Times/Week): 7, Weekly Exercise (Minutes/Week): 175, Intensity: Moderate, Exercise limited by: orthopedic condition(s)   Goals Addressed               This Visit's Progress     Quit Smoking (pt-stated)        My goal is to quit smoking.      Depression Screen PHQ 2/9 Scores 12/30/2021 10/28/2021 05/27/2021 12/22/2020 04/21/2020 05/19/2017 02/17/2017  PHQ - 2 Score 2 0 1 0 0 2 0  PHQ- 9 Score - - - - - 6 -    Fall Risk Fall Risk  12/30/2021 10/28/2021 05/27/2021 12/22/2020 05/19/2017  Falls in the past year? 1 0 1 0 No  Number falls in past yr: 1 0 1 0 -  Injury with Fall? 0 0 1 0 -  Comment - N/A- no falls reported - - -  Risk for fall due to : Impaired balance/gait;Medication side effect;Impaired mobility Orthopedic patient;Medication side effect History of fall(s);Medication side effect;Impaired mobility;Other (Comment) No  Fall Risks -  Risk for fall due to: Comment - - recent foot surgery - -  Follow up Falls evaluation completed Falls prevention discussed;Education provided Falls prevention discussed Falls evaluation completed -    FALL RISK PREVENTION PERTAINING TO THE HOME:  Any stairs in or around the home? No  If so, are there any without handrails? No  Home free of loose throw rugs in walkways, pet beds, electrical cords, etc? Yes  Adequate lighting in your home to reduce risk of falls? Yes   ASSISTIVE DEVICES UTILIZED TO PREVENT FALLS:  Life alert?  No  Use of a cane, walker or w/c? Yes  Grab bars in the bathroom? No  Shower chair or bench in shower? No  Elevated toilet seat or a handicapped toilet? No   TIMED UP AND GO:  Was the test performed? No .  Length of time to ambulate 10 feet: n/a sec.   Gait slow and steady with assistive device  Cognitive Function: Normal cognitive status assessed by direct observation by this Nurse Health Advisor. No abnormalities found.          Immunizations Immunization History  Administered Date(s) Administered   DTaP 06/15/2011   Influenza, Quadrivalent, Recombinant, Inj, Pf 07/31/2018   Influenza,inj,Quad PF,6+ Mos 08/12/2015, 07/19/2016, 07/01/2019, 10/21/2020, 08/11/2021   PFIZER(Purple Top)SARS-COV-2 Vaccination 07/15/2020, 09/28/2020   Pneumococcal Conjugate-13 02/17/2017   Pneumococcal Polysaccharide-23 01/26/2021   Td 10/31/2008   Tdap 01/26/2021   Zoster Recombinat (Shingrix) 11/05/2021    TDAP status: Up to date  Flu Vaccine status: Up to date  Pneumococcal vaccine status: Up to date  Covid-19 vaccine status: Completed vaccines  Qualifies for Shingles Vaccine? Yes   Zostavax completed No   Shingrix Completed?: No.    Education has been provided regarding the importance of this vaccine. Patient has been advised to call insurance company to determine out of pocket expense if they have not yet received this vaccine. Advised may also receive vaccine at local pharmacy or Health Dept. Verbalized acceptance and understanding.  Screening Tests Health Maintenance  Topic Date Due   COLONOSCOPY (Pts 45-10yr Insurance coverage will need to be confirmed)  Never done   MAMMOGRAM  01/14/2017   URINE MICROALBUMIN  07/19/2017   OPHTHALMOLOGY EXAM  11/01/2017   FOOT EXAM  11/13/2018   COVID-19 Vaccine (3 - Pfizer risk series) 10/26/2020   Zoster Vaccines- Shingrix (2 of 2) 12/31/2021   HEMOGLOBIN A1C  05/28/2022   TETANUS/TDAP  01/27/2031   INFLUENZA VACCINE  Completed    Hepatitis C Screening  Completed   HIV Screening  Completed   HPV VACCINES  Aged Out    Health Maintenance  Health Maintenance Due  Topic Date Due   COLONOSCOPY (Pts 45-476yrInsurance coverage will need to be confirmed)  Never done   MAMMOGRAM  01/14/2017   URINE MICROALBUMIN  07/19/2017   OPHTHALMOLOGY EXAM  11/01/2017   FOOT EXAM  11/13/2018   COVID-19 Vaccine (3 - Pfizer risk series) 10/26/2020    Colorectal cancer screening: Referral to GI placed 12/30/2021. Pt aware the office will call re: appt.  Mammogram status: Completed 01/15/2015. Repeat every year (Patient stated that she is in the process of finding an OB/GYN due to insurance coverage).  Bone Density Status: never done  Lung Cancer Screening: (Low Dose CT Chest recommended if Age 56-80ears, 30 pack-year currently smoking OR have quit w/in 15years.) does not qualify.   Lung Cancer Screening Referral: no  Additional Screening:  Hepatitis C  Screening: does qualify; Completed yes  Vision Screening: Recommended annual ophthalmology exams for early detection of glaucoma and other disorders of the eye. Is the patient up to date with their annual eye exam?  Yes  Who is the provider or what is the name of the office in which the patient attends annual eye exams? [facility] If pt is not established with a provider, would they like to be referred to a provider to establish care? No .   Dental Screening: Recommended annual dental exams for proper oral hygiene  Community Resource Referral / Chronic Care Management: CRR required this visit?  No   CCM required this visit?  No      Plan:     I have personally reviewed and noted the following in the patients chart:   Medical and social history Use of alcohol, tobacco or illicit drugs  Current medications and supplements including opioid prescriptions.  Functional ability and status Nutritional status Physical activity Advanced directives List of other  physicians Hospitalizations, surgeries, and ER visits in previous 12 months Vitals Screenings to include cognitive, depression, and falls Referrals and appointments  In addition, I have reviewed and discussed with patient certain preventive protocols, quality metrics, and best practice recommendations. A written personalized care plan for preventive services as well as general preventive health recommendations were provided to patient.     Sheral Flow, LPN   01/31/1426   Nurse Notes:  Patient is cogitatively intact. There were no vitals filed for this visit. There is no height or weight on file to calculate BMI. Medications reviewed with patient; yes opioid use noted.    Medical screening examination/treatment/procedure(s) were performed by non-physician practitioner and as supervising physician I was immediately available for consultation/collaboration.  I agree with above. Lew Dawes, MD

## 2021-12-30 NOTE — Patient Instructions (Signed)
Katherine Herrera , Thank you for taking time to come for your Medicare Wellness Visit. I appreciate your ongoing commitment to your health goals. Please review the following plan we discussed and let me know if I can assist you in the future.   Screening recommendations/referrals: Colonoscopy: order placed 12/30/2021 to [facility] Mammogram: 01/15/2015; patient in process of scheduling with new OB/GYN Bone Density: never done Recommended yearly ophthalmology/optometry visit for glaucoma screening and checkup Recommended yearly dental visit for hygiene and checkup  Vaccinations: Influenza vaccine: 08/11/2021 Pneumococcal vaccine: 02/17/2017, 01/26/2021 Tdap vaccine: 01/26/2021 Shingles vaccine: 11/05/2021; need second dose  Covid-19: 07/15/2020, 09/28/2020  Advanced directives: Advance directive discussed with you today. Even though you declined this today please call our office should you change your mind and we can give you the proper paperwork for you to fill out.  Conditions/risks identified: Yes; goal is to quick smoking.  Next appointment: 01/02/2023 at 1:00 p.m. telephone visit with Mignon Pine, Nurse Health Advisor.  If need to reschedule or cancel please call [phone].  Preventive Care 40-64 Years, Female Preventive care refers to lifestyle choices and visits with your health care provider that can promote health and wellness. What does preventive care include? A yearly physical exam. This is also called an annual well check. Dental exams once or twice a year. Routine eye exams. Ask your health care provider how often you should have your eyes checked. Personal lifestyle choices, including: Daily care of your teeth and gums. Regular physical activity. Eating a healthy diet. Avoiding tobacco and drug use. Limiting alcohol use. Practicing safe sex. Taking low-dose aspirin daily starting at age 64. Taking vitamin and mineral supplements as recommended by your health care provider. What  happens during an annual well check? The services and screenings done by your health care provider during your annual well check will depend on your age, overall health, lifestyle risk factors, and family history of disease. Counseling  Your health care provider may ask you questions about your: Alcohol use. Tobacco use. Drug use. Emotional well-being. Home and relationship well-being. Sexual activity. Eating habits. Work and work Statistician. Method of birth control. Menstrual cycle. Pregnancy history. Screening  You may have the following tests or measurements: Height, weight, and BMI. Blood pressure. Lipid and cholesterol levels. These may be checked every 5 years, or more frequently if you are over 58 years old. Skin check. Lung cancer screening. You may have this screening every year starting at age 67 if you have a 30-pack-year history of smoking and currently smoke or have quit within the past 15 years. Fecal occult blood test (FOBT) of the stool. You may have this test every year starting at age 59. Flexible sigmoidoscopy or colonoscopy. You may have a sigmoidoscopy every 5 years or a colonoscopy every 10 years starting at age 81. Hepatitis C blood test. Hepatitis B blood test. Sexually transmitted disease (STD) testing. Diabetes screening. This is done by checking your blood sugar (glucose) after you have not eaten for a while (fasting). You may have this done every 1-3 years. Mammogram. This may be done every 1-2 years. Talk to your health care provider about when you should start having regular mammograms. This may depend on whether you have a family history of breast cancer. BRCA-related cancer screening. This may be done if you have a family history of breast, ovarian, tubal, or peritoneal cancers. Pelvic exam and Pap test. This may be done every 3 years starting at age 85. Starting at age 59, this 37  be done every 5 years if you have a Pap test in combination with an HPV  test. Bone density scan. This is done to screen for osteoporosis. You may have this scan if you are at high risk for osteoporosis. Discuss your test results, treatment options, and if necessary, the need for more tests with your health care provider. Vaccines  Your health care provider may recommend certain vaccines, such as: Influenza vaccine. This is recommended every year. Tetanus, diphtheria, and acellular pertussis (Tdap, Td) vaccine. You may need a Td booster every 10 years. Zoster vaccine. You may need this after age 29. Pneumococcal 13-valent conjugate (PCV13) vaccine. You may need this if you have certain conditions and were not previously vaccinated. Pneumococcal polysaccharide (PPSV23) vaccine. You may need one or two doses if you smoke cigarettes or if you have certain conditions. Talk to your health care provider about which screenings and vaccines you need and how often you need them. This information is not intended to replace advice given to you by your health care provider. Make sure you discuss any questions you have with your health care provider. Document Released: 11/13/2015 Document Revised: 07/06/2016 Document Reviewed: 08/18/2015 Elsevier Interactive Patient Education  2017 [place] Prevention in the Home Falls can cause injuries. They can happen to people of all ages. There are many things you can do to make your home safe and to help prevent falls. What can I do on the outside of my home? Regularly fix the edges of walkways and driveways and fix any cracks. Remove anything that might make you trip as you walk through a door, such as a raised step or threshold. Trim any bushes or trees on the path to your home. Use bright outdoor lighting. Clear any walking paths of anything that might make someone trip, such as rocks or tools. Regularly check to see if handrails are loose or broken. Make sure that both sides of any steps have handrails. Any raised  decks and porches should have guardrails on the edges. Have any leaves, snow, or ice cleared regularly. Use sand or salt on walking paths during winter. Clean up any spills in your garage right away. This includes oil or grease spills. What can I do in the bathroom? Use night lights. Install grab bars by the toilet and in the tub and shower. Do not use towel bars as grab bars. Use non-skid mats or decals in the tub or shower. If you need to sit down in the shower, use a plastic, non-slip stool. Keep the floor dry. Clean up any water that spills on the floor as soon as it happens. Remove soap buildup in the tub or shower regularly. Attach bath mats securely with double-sided non-slip rug tape. Do not have throw rugs and other things on the floor that can make you trip. What can I do in the bedroom? Use night lights. Make sure that you have a light by your bed that is easy to reach. Do not use any sheets or blankets that are too big for your bed. They should not hang down onto the floor. Have a firm chair that has side arms. You can use this for support while you get dressed. Do not have throw rugs and other things on the floor that can make you trip. What can I do in the kitchen? Clean up any spills right away. Avoid walking on wet floors. Keep items that you use a lot in easy-to-reach places. If  you need to reach something above you, use a strong step stool that has a grab bar. Keep electrical cords out of the way. Do not use floor polish or wax that makes floors slippery. If you must use wax, use non-skid floor wax. Do not have throw rugs and other things on the floor that can make you trip. What can I do with my stairs? Do not leave any items on the stairs. Make sure that there are handrails on both sides of the stairs and use them. Fix handrails that are broken or loose. Make sure that handrails are as long as the stairways. Check any carpeting to make sure that it is firmly attached  to the stairs. Fix any carpet that is loose or worn. Avoid having throw rugs at the top or bottom of the stairs. If you do have throw rugs, attach them to the floor with carpet tape. Make sure that you have a light switch at the top of the stairs and the bottom of the stairs. If you do not have them, ask someone to add them for you. What else can I do to help prevent falls? Wear shoes that: Do not have high heels. Have rubber bottoms. Are comfortable and fit you well. Are closed at the toe. Do not wear sandals. If you use a stepladder: Make sure that it is fully opened. Do not climb a closed stepladder. Make sure that both sides of the stepladder are locked into place. Ask someone to hold it for you, if possible. Clearly mark and make sure that you can see: Any grab bars or handrails. First and last steps. Where the edge of each step is. Use tools that help you move around (mobility aids) if they are needed. These include: Canes. Walkers. Scooters. Crutches. Turn on the lights when you go into a dark area. Replace any light bulbs as soon as they burn out. Set up your furniture so you have a clear path. Avoid moving your furniture around. If any of your floors are uneven, fix them. If there are any pets around you, be aware of where they are. Review your medicines with your doctor. Some medicines can make you feel dizzy. This can increase your chance of falling. Ask your doctor what other things that you can do to help prevent falls. This information is not intended to replace advice given to you by your health care provider. Make sure you discuss any questions you have with your health care provider. Document Released: 08/13/2009 Document Revised: 03/24/2016 Document Reviewed: 11/21/2014 Elsevier Interactive Patient Education  2017 [employer].

## 2021-12-31 ENCOUNTER — Other Ambulatory Visit: Payer: Self-pay

## 2021-12-31 ENCOUNTER — Ambulatory Visit (HOSPITAL_COMMUNITY)
Admission: RE | Admit: 2021-12-31 | Discharge: 2021-12-31 | Disposition: A | Discharge status: Home / Self Care | Payer: Medicare Other | Source: Ambulatory Visit | Attending: Podiatry | Admitting: Podiatry

## 2021-12-31 DIAGNOSIS — I999 Unspecified disorder of circulatory system: Secondary | ICD-10-CM | POA: Diagnosis not present

## 2021-12-31 DIAGNOSIS — E1142 Type 2 diabetes mellitus with diabetic polyneuropathy: Secondary | ICD-10-CM

## 2022-01-03 ENCOUNTER — Telehealth: Payer: Self-pay | Admitting: *Deleted

## 2022-01-03 NOTE — Chronic Care Management (AMB) (Signed)
?  Care Management  ? ?Note ? ?01/03/2022 ?Name: Katherine Herrera MRN: [id number] DOB: Jul 28, 1966 ? ?Katherine Herrera is a 56 y.o. year old female who is a primary care patient of Plotnikov, [name], MD and is actively engaged with the care management team. I reached out to [name] by phone today to assist with re-scheduling a follow up visit with the RN Case Manager ? ?Follow up plan: ?Unsuccessful telephone outreach attempt made. A HIPAA compliant phone message was left for the patient providing contact information and requesting a return call.  ?The care management team will reach out to the patient again over the next 7 days.  ?If patient returns call to provider office, please advise to call [place] at [phone]. ? ?Laverda Sorenson  ?Care Guide, Embedded Care Coordination ?[facility]  Care Management  ?Direct Dial: [phone] ? ?

## 2022-01-06 DIAGNOSIS — E1165 Type 2 diabetes mellitus with hyperglycemia: Secondary | ICD-10-CM | POA: Diagnosis not present

## 2022-01-07 ENCOUNTER — Ambulatory Visit: Payer: Medicare Other | Admitting: Podiatry

## 2022-01-10 NOTE — Chronic Care Management (AMB) (Signed)
?  Care Management  ? ?Note ? ?01/10/2022 ?Name: Katherine Herrera MRN: [id number] DOB: 06-27-1966 ? ?Katherine Herrera is a 56 y.o. year old female who is a primary care patient of Plotnikov, [name], MD and is actively engaged with the care management team. I reached out to [name] by phone today to assist with re-scheduling a follow up visit with the RN Case Manager ? ?Follow up plan: ?Unsuccessful telephone outreach attempt made. The care management team will reach out to the patient again over the next 7 days. If patient returns call to provider office, please advise to call [place] at [phone]. ? ?Laverda Sorenson  ?Care Guide, Embedded Care Coordination ?[facility]  Care Management  ?Direct Dial: [phone] ? ?

## 2022-01-12 ENCOUNTER — Ambulatory Visit (INDEPENDENT_AMBULATORY_CARE_PROVIDER_SITE_OTHER): Payer: Medicare Other | Admitting: Podiatry

## 2022-01-12 ENCOUNTER — Other Ambulatory Visit: Payer: Self-pay

## 2022-01-12 DIAGNOSIS — L97412 Non-pressure chronic ulcer of right heel and midfoot with fat layer exposed: Secondary | ICD-10-CM

## 2022-01-12 DIAGNOSIS — E11621 Type 2 diabetes mellitus with foot ulcer: Secondary | ICD-10-CM | POA: Diagnosis not present

## 2022-01-12 MED ORDER — DOXYCYCLINE HYCLATE 100 MG PO TABS: 100.0000 mg | ORAL_TABLET | Freq: Two times a day (BID) | ORAL | 0 refills | Status: DC

## 2022-01-13 ENCOUNTER — Other Ambulatory Visit: Payer: Self-pay | Admitting: Internal Medicine

## 2022-01-13 DIAGNOSIS — R260 Ataxic gait: Secondary | ICD-10-CM | POA: Diagnosis not present

## 2022-01-13 DIAGNOSIS — E11621 Type 2 diabetes mellitus with foot ulcer: Secondary | ICD-10-CM | POA: Diagnosis not present

## 2022-01-17 NOTE — Chronic Care Management (AMB) (Signed)
?  Care Management  ? ?Note ? ?01/17/2022 ?Name: Katherine Herrera MRN: [id number] DOB: 1966/01/04 ? ?Katherine Herrera is a 56 y.o. year old female who is a primary care patient of Plotnikov, [name], MD and is actively engaged with the care management team. I reached out to [name] by phone today to assist with re-scheduling a follow up visit with the RN Case Manager ? ?Follow up plan: ?Telephone appointment with care management team member scheduled for:01/21/22 ? ?Laverda Sorenson  ?Care Guide, Embedded Care Coordination ?[facility]  Care Management  ?Direct Dial: [phone] ? ?

## 2022-01-18 NOTE — Progress Notes (Signed)
?  Subjective:  ?Patient ID: Katherine Herrera, female    DOB: 1966-02-26,  MRN: [id number] ? ?Chief Complaint  ?Patient presents with  ? Wound Check  ?  Right foot wound care ?Pt stated that she feels like it has got worse and has a odor   ? ?DOS: 05/12/21 ?Procedure: RT FOOT WOUND DEBRIDMENT & IRRIGATION W/POSS SKIN GRAFT SUB, PARTIAL EXCSION OF 5TH METATARSAL BASE BONE, FLEXOR TENOTOMY, ARTHROPLASTY OF 2ND TOE   ? ?56 y.o. female presents with the above complaint.  Patient presents with follow-up to right submetatarsal 5 ulceration with fat layer exposed.  Patient states she is doing okay.  The wound looks about the same.  She is known to Dr. March Rummage.  She has not seen anyone else prior to hand.  She would like to discuss next treatment plan ? ?Objective:  ?Physical Exam: no tenderness at the surgical site, local edema noted, and calf supple, nontender.  Nonpalpable DP PT bilaterally.  Diminished cap refill.  No hair growth noted. ?Incision: healed wound 2nd toe ? ?Plantar 5th met wound measuring 1* 1 cm with granular base. Moderate HPK around wound, no warmth. No probe to bone, no exposed bone ?Assessment:  ? ?1. DM type 2 with diabetic peripheral neuropathy ([place])   ?2. Diabetic ulcer of right midfoot associated with type 2 diabetes mellitus, with fat layer exposed ([place])   ? ? ?Plan:  ?Patient was evaluated and treated and all questions answered. ? ?Ulcer right plantar foot with fat layer exposed submetatarsal 5 ?-Wound cleansed and debrided ?-Wound slightly larger than previous. ?-I have asked her to apply blast X once a day.  She states understanding. ?-She will also be on doxycycline indefinitely until resolve meant of the wound ?-Offload ulcer with surgical shoe. Hold of DM shoes right. ?-Also believe she will benefit from a total contact cast cam boot.  I will plan on ordering it through [facility]  ? ? ?Vascular abnormality ?-ABIs PVRs were reviewed.  Patient may have adequate flow to heal the  wound. ? ? ?Procedure: Selective Debridement of Wound~stagnant ?Rationale: Removal of devitalized tissue from the wound to promote healing.  ?Pre-Debridement Wound Measurements: 1 centimeters cm x 1 cm x 0.2 cm  ?Post-Debridement Wound Measurements: same as pre-debridement. ?Type of Debridement: sharp selective ?Instrumentation: dermal curette, tissue nipper ?Tissue Removed: Devitalized soft-tissue ?Dressing: Dry, sterile, compression dressing. ?Disposition: Patient tolerated procedure well.  ? ?No follow-ups on file.  ? ? ?

## 2022-01-19 DIAGNOSIS — R260 Ataxic gait: Secondary | ICD-10-CM | POA: Diagnosis not present

## 2022-01-19 DIAGNOSIS — E11621 Type 2 diabetes mellitus with foot ulcer: Secondary | ICD-10-CM | POA: Diagnosis not present

## 2022-01-21 ENCOUNTER — Ambulatory Visit (INDEPENDENT_AMBULATORY_CARE_PROVIDER_SITE_OTHER): Payer: Medicare Other | Admitting: *Deleted

## 2022-01-21 DIAGNOSIS — L97411 Non-pressure chronic ulcer of right heel and midfoot limited to breakdown of skin: Secondary | ICD-10-CM

## 2022-01-21 DIAGNOSIS — E1140 Type 2 diabetes mellitus with diabetic neuropathy, unspecified: Secondary | ICD-10-CM

## 2022-01-21 DIAGNOSIS — E785 Hyperlipidemia, unspecified: Secondary | ICD-10-CM

## 2022-01-21 NOTE — Chronic Care Management (AMB) (Addendum)
?Chronic Care Management  ? ?CCM RN Visit Note ? ?01/21/2022 ?Name: Katherine Herrera MRN: [id number] DOB: 1966-05-10 ? ?Subjective: ?Katherine Herrera is a 56 y.o. year old female who is a primary care patient of Plotnikov, [name], MD. The care management team was consulted for assistance with disease management and care coordination needs.   ? ?Engaged with patient by telephone for follow up visit in response to provider referral for case management and/or care coordination services.  ? ?Consent to Services:  ?The patient was given information about Chronic Care Management services, agreed to services, and gave verbal consent prior to initiation of services.  Please see initial visit note for detailed documentation.  ?Patient agreed to services and verbal consent obtained.  ? ?Assessment: Review of patient past medical history, allergies, medications, health status, including review of consultants reports, laboratory and other test data, was performed as part of comprehensive evaluation and provision of chronic care management services.  ? ?SDOH (Social Determinants of Health) assessments and interventions performed:  ?SDOH Interventions   ? ?Flowsheet Row Most Recent Value  ?SDOH Interventions   ?Food Insecurity Interventions Intervention Not Indicated  [continues to deny food insecurity]  ?Housing Interventions Intervention Not Indicated  ?Transportation Interventions Intervention Not Indicated  [reports gets rides to provider appointments through insurance benefit, "I get medical rides"]  ? ?  ?CCM Care Plan ? ?Allergies  ?Allergen Reactions  ? Cephalexin Nausea And Vomiting  ? Demerol  [Meperidine Hcl] Rash  ? Lovastatin Other (See Comments)  ?  Possible myalgia ?  ? Metformin And Related Nausea And Vomiting  ? Sulfamethoxazole-Trimethoprim   ?  Other reaction(s): Unknown ?DILI, pancreatitis  ? Crestor [Rosuvastatin Calcium]   ?  myalgia  ? Niacin Other (See Comments)  ?  Unknown   ? Other Other (See Comments)  ?  Hydromorphone Hcl Itching  ?  Patient has been tolerating Hydromorphone tablets without adverse effect (07/09/19) ?Other reaction(s): Unknown  ? Paroxetine Other (See Comments)  ?  Unknown   ? ?Outpatient Encounter Medications as of 01/21/2022  ?Medication Sig  ? B-D INS SYR ULTRAFINE 1CC/31G 31G X 5/16" 1 ML MISC USE AS DIRECTED  ? baclofen (LIORESAL) 10 MG tablet Take 2 tablets (20 mg total) by mouth 2 (two) times daily.  ? bisacodyl (DULCOLAX) 5 MG EC tablet Take 10 mg by mouth at bedtime.  ? Blood Glucose Monitoring Suppl (ONETOUCH VERIO) w/Device KIT   ? cholecalciferol (VITAMIN D3) 25 MCG (1000 UNIT) tablet Take 1,000 Units by mouth at bedtime.  ? clonazePAM (KLONOPIN) 0.5 MG tablet Take 1 tablet (0.5 mg total) by mouth 2 (two) times daily as needed for anxiety.  ? docusate sodium (COLACE) 100 MG capsule Take 100 mg by mouth daily.  ? doxycycline (VIBRA-TABS) 100 MG tablet Take 1 tablet (100 mg total) by mouth 2 (two) times daily.  ? ezetimibe (ZETIA) 10 MG tablet TAKE 1 TABLET BY MOUTH  DAILY  ? gabapentin (NEURONTIN) 800 MG tablet TAKE 1 TABLET BY MOUTH 4  TIMES DAILY  ? glucose blood (ONETOUCH VERIO) test strip Use to check blood sugar 2 times per day.  ? insulin NPH-regular Human (70-30) 100 UNIT/ML injection Inject 20 Units into the skin 2 (two) times daily with a meal.  ? Insulin Syringe-Needle U-100 (SURE COMFORT INSULIN SYRINGE) 31G X 5/16" 0.5 ML MISC USE AS DIRECTED WITH INSULIN 4 TIMES A DAY  ? lamoTRIgine (LAMICTAL) 25 MG tablet TAKE 1 TABLET BY MOUTH  TWICE DAILY  ?  Lancet Devices (ACCU-CHEK SOFTCLIX) lancets Test two times daily  ? Lancets (ONETOUCH DELICA PLUS CBULAG53M) MISC   ? levothyroxine (SYNTHROID) 75 MCG tablet TAKE 1 TABLET BY MOUTH  DAILY  ? loratadine (CLARITIN) 10 MG tablet Take 1 tablet (10 mg total) by mouth daily as needed for allergies.  ? ondansetron (ZOFRAN) 4 MG tablet Take 4 mg by mouth every 8 (eight) hours as needed for nausea or vomiting.  ? oxyCODONE (ROXICODONE) 15 MG  immediate release tablet Take 1 tablet (15 mg total) by mouth every 6 (six) hours as needed.  ? rOPINIRole (REQUIP) 0.5 MG tablet TAKE 1 TABLET BY MOUTH AT  BEDTIME  ? silver sulfADIAZINE (SILVADENE) 1 % cream APPLY PEA-SIZED AMOUNT TO WOUND DAILY.  ? vitamin B-12 (CYANOCOBALAMIN) 1000 MCG tablet Take 1,000 mcg by mouth daily.  ? ?No facility-administered encounter medications on file as of 01/21/2022.  ? ?Patient Active Problem List  ? Diagnosis Date Noted  ? Hypoglycemia 11/27/2021  ? Thyroid nodule 09/09/2021  ? Statin myopathy 08/06/2021  ? Essential hypertension 10/09/2020  ? Pure hypercholesterolemia 10/09/2020  ? Type 2 diabetes mellitus with hyperglycemia ([place]) 10/09/2020  ? Chronic osteomyelitis involving right ankle and foot ([place])   ? Cellulitis of right lower leg 01/10/2020  ? Cellulitis 01/02/2020  ? Skin cyanosis 01/02/2020  ? Soft tissue infection 01/01/2020  ? Septic shock ([place]) 12/31/2019  ? Peroneal tendonitis, right   ? Right foot ulcer ([place]) 07/04/2019  ? Capsulitis of metatarsophalangeal (MTP) joint of right foot   ? Exostosis of bone of foot   ? Preop exam for internal medicine 12/19/2018  ? Polyneuropathy due to type 2 diabetes mellitus ([place]) 10/01/2018  ? Mild renal insufficiency 09/29/2018  ? Hyponatremia 09/29/2018  ? Chronic pain 09/29/2018  ? Cellulitis of left lower leg   ? Diabetic foot ulcer ([place])   ? Sepsis due to cellulitis ([place]) 09/28/2018  ? Syncope 07/30/2018  ? Earache on right 08/02/2017  ? Cerumen impaction 08/02/2017  ? Constipation due to pain medication 05/22/2017  ? Elevated liver enzymes 05/22/2017  ? Herrera (acute kidney injury) ([place]) 05/03/2017  ? Charcot foot due to diabetes mellitus ([place]) 04/23/2017  ? Leg swelling 10/28/2016  ? Knee contusion 01/13/2016  ? Pain from implanted hardware 01/06/2016  ? Cellulitis and abscess of toe of right foot 07/24/2015  ? Nausea without vomiting 07/24/2015  ? Leg edema, left 06/19/2015  ? DOE (dyspnea on exertion) 06/16/2015  ? Chest pain,  atypical 06/16/2015  ? Family history of musculoskeletal disease 03/12/2015  ? Non-compliant behavior 12/24/2014  ? Ulcer of right foot limited to breakdown of skin ([place]) 09/02/2014  ? Ulcer of foot due to diabetes mellitus ([place]) 08/12/2014  ? Ulcer of other part of foot 06/20/2014  ? Leg pain, left 03/31/2014  ? Callus of foot 03/25/2014  ? 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  ? Rash 01/28/2014  ? Pain in lower limb 01/24/2014  ? Keratosis 01/24/2014  ? Leg cramps 10/09/2013  ? Vaginitis and vulvovaginitis 10/09/2013  ? Ingrown nail 09/30/2013  ? Pain in toe of left foot 09/30/2013  ? Tenosynovitis of foot and ankle 09/03/2013  ? Metatarsal deformity, left 09/03/2013  ? Pain in joint, ankle and foot 01/22/2013  ? Pes cavus 10/13/2012  ? Acquired tight Achilles tendon 10/13/2012  ? Paronychia 06/14/2012  ? 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 ([place]) 03/19/2012  ? Allergic rhinitis 03/19/2012  ? URI (upper respiratory infection) 08/30/2011  ? Myalgia 04/18/2011  ? SHOULDER PAIN  01/17/2011  ? TTS (tarsal tunnel syndrome)   ? GASTROENTERITIS 10/18/2010  ? ARTHRALGIA 01/04/2010  ? WEIGHT GAIN, ABNORMAL 01/04/2010  ? TACHYCARDIA 10/02/2009  ? CBC, ABNORMAL 05/01/2009  ? B12 deficiency 01/05/2009  ? GOUT 01/05/2009  ? Edema 01/05/2009  ? Anxiety disorder 07/28/2008  ? ELBOW PAIN 07/28/2008  ? TOBACCO USE DISORDER/SMOKER-SMOKING CESSATION DISCUSSED 04/21/2008  ? FOOT PAIN 04/21/2008  ? PARESTHESIA 04/21/2008  ? Abdominal pain 04/21/2008  ? SINUSITIS- ACUTE-NOS 11/13/2007  ? COPD exacerbation ([place]) 11/13/2007  ? Diabetic neuropathy ([place]) 08/27/2007  ? LOW BACK PAIN 08/27/2007  ? Dyslipidemia 08/20/2007  ? Hypothyroidism 05/22/2007  ? ?Conditions to be addressed/monitored:  HLD and DMII ? ?Care Plan : RN Care Manager Plan of Care  ?Updates made by Knox Royalty, RN since 01/21/2022 12:00 AM  ?  ? ?Problem: Chronic Disease Management Needs   ?Priority: High  ?  ? ?Long-Range Goal: Ongoing development of plan of care  for long term chronic disease management   ?Start Date: 05/27/2021  ?Expected End Date: 05/27/2022  ?Priority: High  ?Note:   ?Current Barriers:  ?Chronic Disease Management support and education needs rela

## 2022-01-21 NOTE — Patient Instructions (Signed)
Visit Information ? ?Katherine Herrera, "Katherine Herrera" thank you for taking time to talk with me today. Please don't hesitate to contact me if I can be of assistance to you before our next scheduled telephone appointment ? ?Below are the goals we discussed today:  ?Patient Self-Care Activities: ?Patient Katherine "Katherine Herrera" will: ?Take medications as prescribed ?Attend all scheduled provider appointments ?Call pharmacy for medication refills ?Call provider office for new concerns or questions ?Monitor and write down blood sugar several times per day: first thing in the morning before you eat and then again 2 hours after a meal, and at bedtime ?Your goal for fasting blood sugars at home is: less than or equal to 130 ?Your goal for after-eating blood sugars is: between 160-180, 2 hours after a regular sized meal ?Write these blood sugar values at home down so we can review them together during our future phone calls ?Check blood sugar more often if I feel it is too high or too low or if I feel sick ?Continue to follow heart healthy, low salt, low cholesterol, carbohydrate-modified, low sugar diet-- please try to limit your portion sizes ?Keep cholesterol low by eating the right foods and continuing to take prescribed medication for high cholesterol ?Continue taking care of your foot wound and attending podiatry (foot doctor) office visits ?Continue to try and prevent falls ? ?Our next scheduled telephone follow up visit/ appointment is scheduled on: Wednesday, Mar 02, 2022 at 9:45 am- This is a PHONE [place] appointment ? ?If you need to cancel or re-schedule our visit, please call [phone] and our care guide team will be happy to assist you. ?  ?I look forward to hearing about your progress. ?  ?Oneta Rack, RN, BSN, CCRN Alumnus ?[place] ?[phone]: direct office ? ?If you are experiencing a Mental Health or [place] or need someone to talk to, please  ?call the  Suicide and Crisis Lifeline: 988 ?call the [place] National Suicide Prevention Lifeline: 9045478267 or TTY: 7245383265 TTY 561-784-1420) to talk to a trained counselor ?call 1-800-273-TALK (toll free, 24 hour hotline) ?go to [facility] [address] [place] [phone]) ?call 911  ? ?Patient verbalizes understanding of instructions and care plan provided today and agrees to view in [place]. Active MyChart status confirmed with patient ? ?Understanding Your Risk for Falls ?Each year, millions of people have serious injuries from falls. It is important to understand your risk for falling. Talk with your health care provider about your risk and what you can do to lower it. There are actions you can take at home to lower your risk and prevent falls. ?If you do have a serious fall, make sure to tell your health care provider. Falling once raises your risk of falling again. ?How can falls affect me? ?Serious injuries from falls are common. These include: ?Broken bones, such as hip fractures. ?Head injuries, such as traumatic brain injuries (TBI) or concussion. ?A fear of falling can cause you to avoid activities and stay at home. This can make your muscles weaker and actually raise your risk for a fall. ?What can increase my risk? ?There are a number of risk factors that increase your risk for falling. The more risk factors you have, the higher your risk of falling. Serious injuries from a fall happen most often to people older than age 73. Children and young adults ages 76-29 are also at higher risk. ?Common risk factors include: ?Weakness in  the lower body. ?Lack (deficiency) of vitamin D. ?Being generally weak or confused due to long-term (chronic) illness. ?Dizziness or balance problems. ?Poor vision. ?Medicines that cause dizziness or drowsiness. These can include medicines for your blood pressure, heart, anxiety, insomnia, or edema, as well as pain medicines and muscle  relaxants. ?Other risk factors include: ?Drinking alcohol. ?Having had a fall in the past. ?Having depression. ?Having foot pain or wearing improper footwear. ?Working at a dangerous job. ?Having any of the following in your home: ?Tripping hazards, such as floor clutter or loose rugs. ?Poor lighting. ?Pets. ?Having dementia or memory loss. ?What actions can I take to lower my risk of falling? ?  ?Physical activity ?Maintain physical fitness. Do strength and balance exercises. Consider taking a regular class to build strength and balance. Yoga and tai chi are good options. ?Vision ?Have your eyes checked every year and your vision prescription updated as needed. ?Walking aids and footwear ?Wear nonskid shoes. Do not wear high heels. ?Do not walk around the house in socks or slippers. ?Use a cane or walker as told by your health care provider. ?Home safety ?Attach secure railings on both sides of your stairs. ?Install grab bars for your tub, shower, and toilet. Use a bath mat in your tub or shower. ?Use good lighting in all rooms. Keep a flashlight near your bed. ?Make sure there is a clear path from your bed to the bathroom. Use night-lights. ?Do not use throw rugs. Make sure all carpeting is taped or tacked down securely. ?Remove all clutter from walkways and stairways, including extension cords. ?Repair uneven or broken steps. ?Avoid walking on icy or slippery surfaces. Walk on the grass instead of on icy or slick sidewalks. Use ice melt to get rid of ice on walkways. ?Use a cordless phone. ?Questions to ask your health care provider ?Can you help me check my risk for a fall? ?Do any of my medicines make me more likely to fall? ?Should I take a vitamin D supplement? ?What exercises can I do to improve my strength and balance? ?Should I make an appointment to have my vision checked? ?Do I need a bone density test to check for weak bones or osteoporosis? ?Would it help to use a cane or a walker? ?Where to find more  information ?Centers for Disease Control and Prevention, STEADI: http://www.wolf.info/ ?Community-Based Fall Prevention Programs: http://www.wolf.info/ ?[employer] on Aging: http://kim-miller.com/ ?Contact a health care provider if: ?You fall at home. ?You are afraid of falling at home. ?You feel weak, drowsy, or dizzy. ?Summary ?Serious injuries from a fall happen most often to people older than age 19. Children and young adults ages 26-29 are also at higher risk. ?Talk with your health care provider about your risks for falling and how to lower those risks. ?Taking certain precautions at home can lower your risk for falling. ?If you fall, always tell your health care provider. ?This information is not intended to replace advice given to you by your health care provider. Make sure you discuss any questions you have with your health care provider. ?Document Revised: 05/20/2020 Document Reviewed: 05/20/2020 ?Elsevier Patient Education ? 2022 [place]. ?  ? ?

## 2022-01-26 ENCOUNTER — Ambulatory Visit (INDEPENDENT_AMBULATORY_CARE_PROVIDER_SITE_OTHER): Payer: Medicare Other | Admitting: Podiatry

## 2022-01-26 DIAGNOSIS — E11621 Type 2 diabetes mellitus with foot ulcer: Secondary | ICD-10-CM | POA: Diagnosis not present

## 2022-01-26 DIAGNOSIS — L97412 Non-pressure chronic ulcer of right heel and midfoot with fat layer exposed: Secondary | ICD-10-CM

## 2022-01-28 DIAGNOSIS — E11621 Type 2 diabetes mellitus with foot ulcer: Secondary | ICD-10-CM

## 2022-01-28 DIAGNOSIS — E785 Hyperlipidemia, unspecified: Secondary | ICD-10-CM | POA: Diagnosis not present

## 2022-01-28 DIAGNOSIS — E1140 Type 2 diabetes mellitus with diabetic neuropathy, unspecified: Secondary | ICD-10-CM

## 2022-01-28 DIAGNOSIS — L97411 Non-pressure chronic ulcer of right heel and midfoot limited to breakdown of skin: Secondary | ICD-10-CM

## 2022-02-01 ENCOUNTER — Encounter: Payer: Self-pay | Admitting: Podiatry

## 2022-02-01 NOTE — Progress Notes (Signed)
?  Subjective:  ?Patient ID: Katherine Herrera, female    DOB: 1966-03-14,  MRN: [id number] ? ?Chief Complaint  ?Patient presents with  ? Wound Check  ? ?DOS: 05/12/21 ?Procedure: RT FOOT WOUND DEBRIDMENT & IRRIGATION W/POSS SKIN GRAFT SUB, PARTIAL EXCSION OF 5TH METATARSAL BASE BONE, FLEXOR TENOTOMY, ARTHROPLASTY OF 2ND TOE   ? ?56 y.o. female presents with the above complaint.  Patient presents with follow-up to right submetatarsal 5 ulceration with fat layer exposed.  Patient states she is doing okay.  The blast X has been helping.  She denies any other acute complaints. ? ?Objective:  ?Physical Exam: no tenderness at the surgical site, local edema noted, and calf supple, nontender.  Nonpalpable DP PT bilaterally.  Diminished cap refill.  No hair growth noted. ?Incision: healed wound 2nd toe ? ?Plantar 5th met wound measuring 1* 1 cm with granular base. Moderate HPK around wound, no warmth. No probe to bone, no exposed bone ?Assessment:  ? ?1. Diabetic ulcer of right midfoot associated with type 2 diabetes mellitus, with fat layer exposed ([place])   ? ? ? ?Plan:  ?Patient was evaluated and treated and all questions answered. ? ?Ulcer right plantar foot with fat layer exposed submetatarsal 5 ?-Wound cleansed and debrided ?-Wound slightly larger than previous. ?-I have asked her to apply blast X once a day.  She states understanding. ?-Continue doxycycline ?-Offload ulcer with surgical shoe. Hold of DM shoes right. ?-Patient did not qualify for total contact cam boot.  At this time we will continue use of blast X. ?-She will benefit from bracing in the future. ? ? ?Vascular abnormality ?-ABIs PVRs were reviewed.  Patient may have adequate flow to heal the wound. ? ? ?Procedure: Selective Debridement of Wound~stagnant ?Rationale: Removal of devitalized tissue from the wound to promote healing.  ?Pre-Debridement Wound Measurements: 0.9 cm x 0.9 cm x 0.2 ?Post-Debridement Wound Measurements: same as pre-debridement. ?Type of  Debridement: sharp selective ?Instrumentation: dermal curette, tissue nipper ?Tissue Removed: Devitalized soft-tissue ?Dressing: Dry, sterile, compression dressing. ?Disposition: Patient tolerated procedure well.  ? ?No follow-ups on file.  ? ? ?

## 2022-02-06 DIAGNOSIS — E1165 Type 2 diabetes mellitus with hyperglycemia: Secondary | ICD-10-CM | POA: Diagnosis not present

## 2022-02-08 ENCOUNTER — Other Ambulatory Visit: Payer: Self-pay | Admitting: Podiatry

## 2022-02-08 NOTE — Telephone Encounter (Signed)
Please advise 

## 2022-02-15 ENCOUNTER — Encounter: Payer: Self-pay | Admitting: Pharmacist

## 2022-02-15 DIAGNOSIS — T466X5A Adverse effect of antihyperlipidemic and antiarteriosclerotic drugs, initial encounter: Secondary | ICD-10-CM

## 2022-02-15 NOTE — Progress Notes (Signed)
[facility])  ?                                                   Pharmacy Team  ?                                      Statin Quality Measure Assessment ?  ? ?02/15/2022 ? ?Katherine Herrera ?06/14/1966 ?[id number] ? ?Per review of chart and payor information, this patient has been flagged for non-adherence to the following CMS Quality Measure:  ? '[x]'$  Statin Use in Persons with Diabetes (SUPD) ? '[]'$  Statin Use in Persons with Cardiovascular Disease [facility]) ? ? ?Per chart review, pt has history of statin intolerance to rosuvastatin and lovastatin due to myalgias.  ? ?Currently prescribed statin:  '[]'$  Yes '[x]'$  No      ?History of statin use:            '[x]'$  Yes '[]'$  No   ? ?Please consider ONE of the following recommendations if clinically appropriate:    ? ?Initiate moderate intensity  ?        statin with reduced frequency if prior  ?        statin intolerance 1x weekly, #13, 3 refills  ? 2x weekly, #26, 3 refills  ? 3x weekly, #39, 3 refills  ?  ?Code for past statin intolerance or other exclusions (required annually) ? Drug Induced Myopathy G72.0  ? ? ?Thank you for your time, ?Ralene Bathe, PharmD, BCPS ?Clinical Pharmacist ?[place] ?[phone] ?  ? ? ?

## 2022-02-16 ENCOUNTER — Ambulatory Visit (INDEPENDENT_AMBULATORY_CARE_PROVIDER_SITE_OTHER): Payer: Medicare Other | Admitting: Podiatry

## 2022-02-16 DIAGNOSIS — L97412 Non-pressure chronic ulcer of right heel and midfoot with fat layer exposed: Secondary | ICD-10-CM | POA: Diagnosis not present

## 2022-02-16 DIAGNOSIS — E11621 Type 2 diabetes mellitus with foot ulcer: Secondary | ICD-10-CM | POA: Diagnosis not present

## 2022-02-22 NOTE — Progress Notes (Signed)
?  Subjective:  ?Patient ID: Katherine Herrera, female    DOB: 1966-04-28,  MRN: [id number] ? ?Chief Complaint  ?Patient presents with  ? Wound Check  ? ?DOS: 05/12/21 ?Procedure: RT FOOT WOUND DEBRIDMENT & IRRIGATION W/POSS SKIN GRAFT SUB, PARTIAL EXCSION OF 5TH METATARSAL BASE BONE, FLEXOR TENOTOMY, ARTHROPLASTY OF 2ND TOE   ? ?56 y.o. female presents with the above complaint.  Patient presents with follow-up to right submetatarsal 5 ulceration with fat layer exposed.  Her pain has gone down.  She has been applying blast X she denies any other acute complaints. ? ?Objective:  ?Physical Exam: no tenderness at the surgical site, local edema noted, and calf supple, nontender.  Nonpalpable DP PT bilaterally.  Diminished cap refill.  No hair growth noted. ?Incision: healed wound 2nd toe ? ?Plantar 5th met wound measuring 1* 1 cm with granular base. Moderate HPK around wound, no warmth. No probe to bone, no exposed bone ?Assessment:  ? ?No diagnosis found. ? ? ? ?Plan:  ?Patient was evaluated and treated and all questions answered. ? ?Ulcer right plantar foot with fat layer exposed submetatarsal 5 ?-Wound cleansed and debrided ?-Wound slightly larger than previous. ?-I have asked her to apply blast X once a day.  She states understanding. ?-Continue doxycycline ?-Offload ulcer with surgical shoe. Hold of DM shoes right. ?-Continue applying blast X ?-Given the stagnation of the wound I believe patient would benefit from an MRI evaluation rule out osteomyelitis ? ? ?Vascular abnormality ?-ABIs PVRs were reviewed.  Patient may have adequate flow to heal the wound. ? ? ?Procedure: Selective Debridement of Wound~stagnant ?Rationale: Removal of devitalized tissue from the wound to promote healing.  ?Pre-Debridement Wound Measurements: 0.9 cm x 0.9 cm x 0.2 ?Post-Debridement Wound Measurements: same as pre-debridement. ?Type of Debridement: sharp selective ?Instrumentation: dermal curette, tissue nipper ?Tissue Removed: Devitalized  soft-tissue ?Dressing: Dry, sterile, compression dressing. ?Disposition: Patient tolerated procedure well.  ? ?No follow-ups on file.  ? ? ?

## 2022-02-23 ENCOUNTER — Encounter: Payer: Self-pay | Admitting: Internal Medicine

## 2022-02-23 ENCOUNTER — Ambulatory Visit (INDEPENDENT_AMBULATORY_CARE_PROVIDER_SITE_OTHER): Payer: Medicare Other | Admitting: Internal Medicine

## 2022-02-23 DIAGNOSIS — E039 Hypothyroidism, unspecified: Secondary | ICD-10-CM | POA: Diagnosis not present

## 2022-02-23 DIAGNOSIS — M5440 Lumbago with sciatica, unspecified side: Secondary | ICD-10-CM | POA: Diagnosis not present

## 2022-02-23 DIAGNOSIS — F32A Depression, unspecified: Secondary | ICD-10-CM | POA: Diagnosis not present

## 2022-02-23 DIAGNOSIS — E1165 Type 2 diabetes mellitus with hyperglycemia: Secondary | ICD-10-CM

## 2022-02-23 DIAGNOSIS — Z794 Long term (current) use of insulin: Secondary | ICD-10-CM

## 2022-02-23 DIAGNOSIS — M79644 Pain in right finger(s): Secondary | ICD-10-CM

## 2022-02-23 DIAGNOSIS — E1140 Type 2 diabetes mellitus with diabetic neuropathy, unspecified: Secondary | ICD-10-CM | POA: Diagnosis not present

## 2022-02-23 DIAGNOSIS — E1142 Type 2 diabetes mellitus with diabetic polyneuropathy: Secondary | ICD-10-CM | POA: Diagnosis not present

## 2022-02-23 LAB — CBC WITH DIFFERENTIAL/PLATELET
Basophils Absolute: 0.1 10*3/uL (ref 0.0–0.1)
Basophils Relative: 0.7 % (ref 0.0–3.0)
Eosinophils Absolute: 0.2 10*3/uL (ref 0.0–0.7)
Eosinophils Relative: 2.1 % (ref 0.0–5.0)
HCT: 43.4 % (ref 36.0–46.0)
Hemoglobin: 14.3 g/dL (ref 12.0–15.0)
Lymphocytes Relative: 34 % (ref 12.0–46.0)
Lymphs Abs: 3.8 10*3/uL (ref 0.7–4.0)
MCHC: 33 g/dL (ref 30.0–36.0)
MCV: 85.7 fl (ref 78.0–100.0)
Monocytes Absolute: 0.8 10*3/uL (ref 0.1–1.0)
Monocytes Relative: 7.1 % (ref 3.0–12.0)
Neutro Abs: 6.3 10*3/uL (ref 1.4–7.7)
Neutrophils Relative %: 56.1 % (ref 43.0–77.0)
Platelets: 299 10*3/uL (ref 150.0–400.0)
RBC: 5.06 Mil/uL (ref 3.87–5.11)
RDW: 14.7 % (ref 11.5–15.5)
WBC: 11.2 10*3/uL — ABNORMAL HIGH (ref 4.0–10.5)

## 2022-02-23 LAB — URINALYSIS
Bilirubin Urine: NEGATIVE
Hgb urine dipstick: NEGATIVE
Ketones, ur: NEGATIVE
Leukocytes,Ua: NEGATIVE
Nitrite: NEGATIVE
Specific Gravity, Urine: 1.025 (ref 1.000–1.030)
Total Protein, Urine: NEGATIVE
Urine Glucose: NEGATIVE
Urobilinogen, UA: 1 (ref 0.0–1.0)
pH: 6 (ref 5.0–8.0)

## 2022-02-23 LAB — COMPREHENSIVE METABOLIC PANEL
ALT: 16 U/L (ref 0–35)
AST: 17 U/L (ref 0–37)
Albumin: 4 g/dL (ref 3.5–5.2)
Alkaline Phosphatase: 102 U/L (ref 39–117)
BUN: 21 mg/dL (ref 6–23)
CO2: 31 mEq/L (ref 19–32)
Calcium: 8.9 mg/dL (ref 8.4–10.5)
Chloride: 100 mEq/L (ref 96–112)
Creatinine, Ser: 1.18 mg/dL (ref 0.40–1.20)
GFR: 51.73 mL/min — ABNORMAL LOW (ref >60.00)
Glucose, Bld: 110 mg/dL — ABNORMAL HIGH (ref 70–99)
Potassium: 4.1 mEq/L (ref 3.5–5.1)
Sodium: 137 mEq/L (ref 135–145)
Total Bilirubin: 0.2 mg/dL (ref 0.2–1.2)
Total Protein: 7.8 g/dL (ref 6.0–8.3)

## 2022-02-23 LAB — LDL CHOLESTEROL, DIRECT: Direct LDL: 135 mg/dL

## 2022-02-23 LAB — URIC ACID: Uric Acid, Serum: 5.3 mg/dL (ref 2.4–7.0)

## 2022-02-23 LAB — LIPID PANEL
Cholesterol: 215 mg/dL — ABNORMAL HIGH (ref 0–200)
HDL: 34.7 mg/dL — ABNORMAL LOW (ref >39.00)
NonHDL: 180.75
Total CHOL/HDL Ratio: 6
Triglycerides: 301 mg/dL — ABNORMAL HIGH (ref 0.0–149.0)
VLDL: 60.2 mg/dL — ABNORMAL HIGH (ref 0.0–40.0)

## 2022-02-23 MED ORDER — FLUOXETINE HCL 10 MG PO CAPS: 10.0000 mg | ORAL_CAPSULE | Freq: Every day | ORAL | 3 refills | Status: DC

## 2022-02-23 MED ORDER — OXYCODONE HCL 15 MG PO TABS: 15.0000 mg | ORAL_TABLET | Freq: Four times a day (QID) | ORAL | 0 refills | Status: DC | PRN

## 2022-02-23 MED ORDER — COLCHICINE 0.6 MG PO TABS: ORAL_TABLET | ORAL | 1 refills | Status: DC

## 2022-02-23 MED ORDER — CLONAZEPAM 0.5 MG PO TABS: 0.5000 mg | ORAL_TABLET | Freq: Two times a day (BID) | ORAL | 1 refills | Status: DC | PRN

## 2022-02-23 NOTE — Assessment & Plan Note (Signed)
CBGs are better on Libre 2 

## 2022-02-23 NOTE — Assessment & Plan Note (Signed)
On Levothroid ?F/u w/Dr Chalmers Cater ?

## 2022-02-23 NOTE — Progress Notes (Signed)
? ?Subjective:  ?Patient ID: Katherine Herrera, female    DOB: 1966/08/03  Age: 56 y.o. MRN: [id number] ? ?CC: No chief complaint on file. ? ? ?HPI ?Katherine Herrera presents for LBP, DM, HTN ?C/o R middle finger pain x 5 days - it is flexed now ?Loosing wt on diet ?C/o depression -family stress ? ?Outpatient Medications Prior to Visit  ?Medication Sig Dispense Refill  ? B-D INS SYR ULTRAFINE 1CC/31G 31G X 5/16" 1 ML MISC USE AS DIRECTED 100 each 0  ? baclofen (LIORESAL) 10 MG tablet Take 2 tablets (20 mg total) by mouth 2 (two) times daily. 180 tablet 1  ? bisacodyl (DULCOLAX) 5 MG EC tablet Take 10 mg by mouth at bedtime.    ? Blood Glucose Monitoring Suppl (ONETOUCH VERIO) w/Device KIT     ? cholecalciferol (VITAMIN D3) 25 MCG (1000 UNIT) tablet Take 1,000 Units by mouth at bedtime.    ? docusate sodium (COLACE) 100 MG capsule Take 100 mg by mouth daily.    ? ezetimibe (ZETIA) 10 MG tablet TAKE 1 TABLET BY MOUTH  DAILY 90 tablet 3  ? glucose blood (ONETOUCH VERIO) test strip Use to check blood sugar 2 times per day. 100 each 12  ? insulin NPH-regular Human (70-30) 100 UNIT/ML injection Inject 20 Units into the skin 2 (two) times daily with a meal. 10 mL 11  ? Insulin Syringe-Needle U-100 (SURE COMFORT INSULIN SYRINGE) 31G X 5/16" 0.5 ML MISC USE AS DIRECTED WITH INSULIN 4 TIMES A DAY 400 each 2  ? lamoTRIgine (LAMICTAL) 25 MG tablet TAKE 1 TABLET BY MOUTH  TWICE DAILY 180 tablet 3  ? Lancet Devices (ACCU-CHEK SOFTCLIX) lancets Test two times daily 100 each 12  ? Lancets (ONETOUCH DELICA PLUS [id number]) MISC     ? lisinopril (ZESTRIL) 10 MG tablet Take 10 mg by mouth daily.    ? loratadine (CLARITIN) 10 MG tablet Take 1 tablet (10 mg total) by mouth daily as needed for allergies. 100 tablet 3  ? ondansetron (ZOFRAN) 4 MG tablet Take 4 mg by mouth every 8 (eight) hours as needed for nausea or vomiting.    ? silver sulfADIAZINE (SILVADENE) 1 % cream APPLY PEA-SIZED AMOUNT TO WOUND DAILY. 50 g 0  ? vitamin B-12  (CYANOCOBALAMIN) 1000 MCG tablet Take 1,000 mcg by mouth daily.    ? clonazePAM (KLONOPIN) 0.5 MG tablet Take 1 tablet (0.5 mg total) by mouth 2 (two) times daily as needed for anxiety. 60 tablet 1  ? gabapentin (NEURONTIN) 800 MG tablet TAKE 1 TABLET BY MOUTH 4  TIMES DAILY 360 tablet 3  ? levothyroxine (SYNTHROID) 75 MCG tablet TAKE 1 TABLET BY MOUTH  DAILY 90 tablet 3  ? oxyCODONE (ROXICODONE) 15 MG immediate release tablet Take 1 tablet (15 mg total) by mouth every 6 (six) hours as needed. 120 tablet 0  ? rOPINIRole (REQUIP) 0.5 MG tablet TAKE 1 TABLET BY MOUTH AT  BEDTIME 90 tablet 3  ? ?No facility-administered medications prior to visit.  ? ? ?ROS: ?Review of Systems  ?Constitutional:  Positive for fatigue. Negative for activity change, appetite change, chills and unexpected weight change.  ?HENT:  Negative for congestion, mouth sores and sinus pressure.   ?Eyes:  Negative for visual disturbance.  ?Respiratory:  Negative for cough and chest tightness.   ?Gastrointestinal:  Negative for abdominal pain and nausea.  ?Genitourinary:  Negative for difficulty urinating, frequency and vaginal pain.  ?Musculoskeletal:  Positive for arthralgias, back pain and  gait problem.  ?Skin:  Negative for pallor and rash.  ?Neurological:  Negative for dizziness, tremors, weakness, numbness and headaches.  ?Hematological:  Does not bruise/bleed easily.  ?Psychiatric/Behavioral:  Positive for dysphoric mood. Negative for behavioral problems, confusion, sleep disturbance and suicidal ideas. The patient is nervous/anxious.   ? ?Objective:  ?BP 108/70 (BP Location: Left Arm, Patient Position: Sitting, Cuff Size: Large)   Pulse 80   Temp 98.7 ?F (37.1 ?C) (Oral)   Ht 5' 9.5" (1.765 m)   Wt 219 lb (99.3 kg)   SpO2 93%   BMI 31.88 kg/m?  ? ?BP Readings from Last 3 Encounters:  ?02/23/22 108/70  ?11/28/21 138/76  ?11/24/21 (!) 102/58  ? ? ?Wt Readings from Last 3 Encounters:  ?02/23/22 219 lb (99.3 kg)  ?11/27/21 225 lb 12 oz  (102.4 kg)  ?11/24/21 221 lb 9.6 oz (100.5 kg)  ? ? ?Physical Exam ?Constitutional:   ?   General: She is not in acute distress. ?   Appearance: She is well-developed. She is obese.  ?HENT:  ?   Head: Normocephalic.  ?   Right Ear: External ear normal.  ?   Left Ear: External ear normal.  ?   Nose: Nose normal.  ?Eyes:  ?   General:     ?   Right eye: No discharge.     ?   Left eye: No discharge.  ?   Conjunctiva/sclera: Conjunctivae normal.  ?   Pupils: Pupils are equal, round, and reactive to light.  ?Neck:  ?   Thyroid: No thyromegaly.  ?   Vascular: No JVD.  ?   Trachea: No tracheal deviation.  ?Cardiovascular:  ?   Rate and Rhythm: Normal rate and regular rhythm.  ?   Heart sounds: Normal heart sounds.  ?Pulmonary:  ?   Effort: No respiratory distress.  ?   Breath sounds: No stridor. No wheezing.  ?Abdominal:  ?   General: Bowel sounds are normal. There is no distension.  ?   Palpations: Abdomen is soft. There is no mass.  ?   Tenderness: There is no abdominal tenderness. There is no guarding or rebound.  ?Musculoskeletal:     ?   General: No tenderness.  ?   Cervical back: Normal range of motion and neck supple. No rigidity.  ?Lymphadenopathy:  ?   Cervical: No cervical adenopathy.  ?Skin: ?   Findings: No erythema or rash.  ?Neurological:  ?   Cranial Nerves: No cranial nerve deficit.  ?   Motor: No abnormal muscle tone.  ?   Coordination: Coordination normal.  ?   Deep Tendon Reflexes: Reflexes normal.  ?Psychiatric:     ?   Behavior: Behavior normal.     ?   Thought Content: Thought content normal.     ?   Judgment: Judgment normal.  ?LS w/pain ? ?R middle finger pain in the PIP- it is flexed now; extensor tendon is prominent ? ?R foot in a surgical boot, L foot in the surgical shoe ? ? ? A total time of 45 minutes was spent preparing to see the patient, reviewing tests, x-rays, operative reports and other medical records.  Also, obtaining history and performing comprehensive physical exam.  Additionally,  counseling the patient regarding the above listed issues.   Finally, documenting clinical information in the health records, coordination of care, educating the patient re depression, DM.  ? ? ?Lab Results  ?Component Value Date  ? WBC 11.2 (H) 02/23/2022  ?  HGB 14.3 02/23/2022  ? HCT 43.4 02/23/2022  ? PLT 299.0 02/23/2022  ? GLUCOSE 110 (H) 02/23/2022  ? CHOL 215 (H) 02/23/2022  ? TRIG 301.0 (H) 02/23/2022  ? HDL 34.70 (L) 02/23/2022  ? LDLDIRECT 135.0 02/23/2022  ? [place] 81 01/02/2013  ? ALT 16 02/23/2022  ? AST 17 02/23/2022  ? NA 137 02/23/2022  ? K 4.1 02/23/2022  ? CL 100 02/23/2022  ? CREATININE 1.18 02/23/2022  ? BUN 21 02/23/2022  ? CO2 31 02/23/2022  ? TSH 0.99 09/09/2021  ? INR 1.0 07/19/2019  ? HGBA1C 7.7 (H) 11/28/2021  ? MICROALBUR 0.9 07/19/2016  ? ? ?VAS [place] ABI WITH/WO TBI ? ?Result Date: 12/31/2021 ? LOWER EXTREMITY DOPPLER STUDY Patient Name:  Stockwell JON  Date of Exam:   12/31/2021 Medical Rec #: [id number]        Accession #:    [id number] Date of Birth: 12/22/1965        Patient Gender: F Patient Age:   19 years Exam Location:  [facility] Procedure:      VAS [place] ABI WITH/WO TBI Referring Phys: Lennette Bihari PATEL --------------------------------------------------------------------------------  Indications: Non healing right lateral mid foot ulcer for 5-6 years. High Risk Factors: Diabetes, current smoker.  Performing Technologist: Ralene Cork RVT  Examination Guidelines: A complete evaluation includes at minimum, Doppler waveform signals and systolic blood pressure reading at the level of bilateral brachial, anterior tibial, and posterior tibial arteries, when vessel segments are accessible. Bilateral testing is considered an integral part of a complete examination. Photoelectric Plethysmograph (PPG) waveforms and toe systolic pressure readings are included as required and additional duplex testing as needed. Limited examinations for reoccurring indications may be performed as  noted.  ABI Findings: +---------+------------------+-----+---------+--------+ Right    Rt Pressure (mmHg)IndexWaveform Comment  +---------+------------------+-----+---------+--------+ Brachial 123

## 2022-02-23 NOTE — Patient Instructions (Signed)
Get extension middle finger splint ?

## 2022-02-23 NOTE — Assessment & Plan Note (Addendum)
New - ?gout vs other. R middle finger pain in the PIP- it is flexed now; extensor tendon is prominent ?Get extension middle finger splint ?Blue-Emu cream was recommended to use 2-3 times a day ?Pt declined steroids po ?Will try Colchicine ?

## 2022-02-23 NOTE — Assessment & Plan Note (Signed)
Seeing Dr Chalmers Cater  ?

## 2022-02-23 NOTE — Assessment & Plan Note (Signed)
Chronic OA ?Cont on Oxycodone 15 mg ? Potential benefits of a long term opioids use as well as potential risks (i.e. addiction risk, apnea etc) and complications (i.e. Somnolence, constipation and others) were explained to the patient and were aknowledged. Risks of use w/benzodiazepines discussed  ?Do not take w/Clonazepam ?

## 2022-02-23 NOTE — Assessment & Plan Note (Signed)
Worse ?Separated since 2021 ?

## 2022-02-24 ENCOUNTER — Other Ambulatory Visit: Payer: Self-pay | Admitting: Internal Medicine

## 2022-02-24 LAB — RHEUMATOID FACTOR: Rheumatoid fact SerPl-aCnc: <14 IU/mL (ref <14)

## 2022-02-25 LAB — PMP SCREEN PROFILE (10S), URINE
Amphetamine Scrn, Ur: NEGATIVE ng/mL
BARBITURATE SCREEN URINE: NEGATIVE ng/mL
BENZODIAZEPINE SCREEN, URINE: NEGATIVE ng/mL
CANNABINOIDS UR QL SCN: NEGATIVE ng/mL
Cocaine (Metab) Scrn, Ur: NEGATIVE ng/mL
Creatinine(Crt), U: 157.4 mg/dL (ref 20.0–300.0)
Methadone Screen, Urine: NEGATIVE ng/mL
OXYCODONE+OXYMORPHONE UR QL SCN: POSITIVE ng/mL — AB
Opiate Scrn, Ur: POSITIVE ng/mL — AB
Ph of Urine: 5.6 (ref 4.5–8.9)
Phencyclidine Qn, Ur: NEGATIVE ng/mL
Propoxyphene Scrn, Ur: NEGATIVE ng/mL

## 2022-03-02 ENCOUNTER — Other Ambulatory Visit: Payer: Self-pay | Admitting: Internal Medicine

## 2022-03-02 ENCOUNTER — Ambulatory Visit (INDEPENDENT_AMBULATORY_CARE_PROVIDER_SITE_OTHER): Payer: Medicare Other | Admitting: *Deleted

## 2022-03-02 DIAGNOSIS — E1140 Type 2 diabetes mellitus with diabetic neuropathy, unspecified: Secondary | ICD-10-CM

## 2022-03-02 DIAGNOSIS — E11621 Type 2 diabetes mellitus with foot ulcer: Secondary | ICD-10-CM

## 2022-03-02 DIAGNOSIS — E785 Hyperlipidemia, unspecified: Secondary | ICD-10-CM

## 2022-03-02 NOTE — Chronic Care Management (AMB) (Addendum)
?Chronic Care Management  ? ?CCM RN Visit Note ? ?03/02/2022 ?Name: Katherine Herrera MRN: [id number] DOB: 01-Jan-1966 ? ?Subjective: ?Katherine Herrera is a 56 y.o. year old female who is a primary care patient of Plotnikov, [name], MD. The care management team was consulted for assistance with disease management and care coordination needs.   ? ?Engaged with patient by telephone for follow up visit in response to provider referral for case management and/or care coordination services.  ? ?Consent to Services:  ?The patient was given information about Chronic Care Management services, agreed to services, and gave verbal consent prior to initiation of services.  Please see initial visit note for detailed documentation.  ?Patient agreed to services and verbal consent obtained.  ? ?Assessment: Review of patient past medical history, allergies, medications, health status, including review of consultants reports, laboratory and other test data, was performed as part of comprehensive evaluation and provision of chronic care management services.  ? ?SDOH (Social Determinants of Health) assessments and interventions performed:  ?SDOH Interventions   ? ?Flowsheet Row Most Recent Value  ?SDOH Interventions   ?Transportation Interventions Intervention Not Indicated  [reports currently using insurance benefit for transportation]  ? ?  ?CCM Care Plan ? ?Allergies  ?Allergen Reactions  ? Cephalexin Nausea And Vomiting  ? Demerol  [Meperidine Hcl] Rash  ? Lovastatin Other (See Comments)  ?  Possible myalgia ?  ? Metformin And Related Nausea And Vomiting  ? Sulfamethoxazole-Trimethoprim   ?  Other reaction(s): Unknown ?DILI, pancreatitis  ? Crestor [Rosuvastatin Calcium]   ?  myalgia  ? Niacin Other (See Comments)  ?  Unknown   ? Other Other (See Comments)  ? Hydromorphone Hcl Itching  ?  Patient has been tolerating Hydromorphone tablets without adverse effect (07/09/19) ?Other reaction(s): Unknown  ? Paroxetine Other (See Comments)  ?   Unknown   ? ?Outpatient Encounter Medications as of 03/02/2022  ?Medication Sig Note  ? B-D INS SYR ULTRAFINE 1CC/31G 31G X 5/16" 1 ML MISC USE AS DIRECTED   ? baclofen (LIORESAL) 10 MG tablet Take 2 tablets (20 mg total) by mouth 2 (two) times daily.   ? bisacodyl (DULCOLAX) 5 MG EC tablet Take 10 mg by mouth at bedtime.   ? Blood Glucose Monitoring Suppl (ONETOUCH VERIO) w/Device KIT    ? cholecalciferol (VITAMIN D3) 25 MCG (1000 UNIT) tablet Take 1,000 Units by mouth at bedtime.   ? clonazePAM (KLONOPIN) 0.5 MG tablet Take 1 tablet (0.5 mg total) by mouth 2 (two) times daily as needed for anxiety.   ? colchicine 0.6 MG tablet TAKE 2 TABLETS AS NEEDED GOUT ATTACK. THEN TAKE ANOTHER 1 TAB IN 1-2 HOURS. DONT REPEAT FOR 3 DAYS.   ? docusate sodium (COLACE) 100 MG capsule Take 100 mg by mouth daily.   ? ezetimibe (ZETIA) 10 MG tablet TAKE 1 TABLET BY MOUTH  DAILY   ? FLUoxetine (PROZAC) 10 MG capsule Take 1 capsule (10 mg total) by mouth daily.   ? gabapentin (NEURONTIN) 800 MG tablet TAKE 1 TABLET BY MOUTH 4  TIMES DAILY   ? glucose blood (ONETOUCH VERIO) test strip Use to check blood sugar 2 times per day.   ? insulin NPH-regular Human (70-30) 100 UNIT/ML injection Inject 20 Units into the skin 2 (two) times daily with a meal. 03/02/2022: 03/02/22: Reports taking 30 U TID  ? Insulin Syringe-Needle U-100 (SURE COMFORT INSULIN SYRINGE) 31G X 5/16" 0.5 ML MISC USE AS DIRECTED WITH INSULIN 4 TIMES  A DAY   ? lamoTRIgine (LAMICTAL) 25 MG tablet TAKE 1 TABLET BY MOUTH  TWICE DAILY   ? Lancet Devices (ACCU-CHEK SOFTCLIX) lancets Test two times daily   ? Lancets (ONETOUCH DELICA PLUS WEXHBZ16R) MISC    ? levothyroxine (SYNTHROID) 75 MCG tablet TAKE 1 TABLET BY MOUTH  DAILY   ? lisinopril (ZESTRIL) 10 MG tablet Take 10 mg by mouth daily.   ? loratadine (CLARITIN) 10 MG tablet Take 1 tablet (10 mg total) by mouth daily as needed for allergies.   ? ondansetron (ZOFRAN) 4 MG tablet Take 4 mg by mouth every 8 (eight) hours as needed  for nausea or vomiting.   ? oxyCODONE (ROXICODONE) 15 MG immediate release tablet Take 1 tablet (15 mg total) by mouth every 6 (six) hours as needed.   ? rOPINIRole (REQUIP) 0.5 MG tablet TAKE 1 TABLET BY MOUTH AT  BEDTIME   ? silver sulfADIAZINE (SILVADENE) 1 % cream APPLY PEA-SIZED AMOUNT TO WOUND DAILY.   ? vitamin B-12 (CYANOCOBALAMIN) 1000 MCG tablet Take 1,000 mcg by mouth daily.   ? ?No facility-administered encounter medications on file as of 03/02/2022.  ? ?Patient Active Problem List  ? Diagnosis Date Noted  ? Finger pain, right 02/23/2022  ? Depression 02/23/2022  ? Hypoglycemia 11/27/2021  ? Thyroid nodule 09/09/2021  ? Statin myopathy 08/06/2021  ? Essential hypertension 10/09/2020  ? Pure hypercholesterolemia 10/09/2020  ? Type 2 diabetes mellitus with hyperglycemia ([place]) 10/09/2020  ? Chronic osteomyelitis involving right ankle and foot ([place])   ? Cellulitis of right lower leg 01/10/2020  ? Cellulitis 01/02/2020  ? Skin cyanosis 01/02/2020  ? Soft tissue infection 01/01/2020  ? Septic shock ([place]) 12/31/2019  ? Peroneal tendonitis, right   ? Right foot ulcer ([place]) 07/04/2019  ? Capsulitis of metatarsophalangeal (MTP) joint of right foot   ? Exostosis of bone of foot   ? Preop exam for internal medicine 12/19/2018  ? Polyneuropathy due to type 2 diabetes mellitus ([place]) 10/01/2018  ? Mild renal insufficiency 09/29/2018  ? Hyponatremia 09/29/2018  ? Chronic pain 09/29/2018  ? Cellulitis of left lower leg   ? Diabetic foot ulcer ([place])   ? Sepsis due to cellulitis ([place]) 09/28/2018  ? Syncope 07/30/2018  ? Earache on right 08/02/2017  ? Cerumen impaction 08/02/2017  ? Constipation due to pain medication 05/22/2017  ? Elevated liver enzymes 05/22/2017  ? AKI (acute kidney injury) ([place]) 05/03/2017  ? Charcot foot due to diabetes mellitus ([place]) 04/23/2017  ? Leg swelling 10/28/2016  ? Knee contusion 01/13/2016  ? Pain from implanted hardware 01/06/2016  ? Cellulitis and abscess of toe of right foot 07/24/2015  ?  Nausea without vomiting 07/24/2015  ? Leg edema, left 06/19/2015  ? DOE (dyspnea on exertion) 06/16/2015  ? Chest pain, atypical 06/16/2015  ? Family history of musculoskeletal disease 03/12/2015  ? Non-compliant behavior 12/24/2014  ? Ulcer of right foot limited to breakdown of skin ([place]) 09/02/2014  ? Ulcer of foot due to diabetes mellitus ([place]) 08/12/2014  ? Ulcer of other part of foot 06/20/2014  ? Leg pain, left 03/31/2014  ? Callus of foot 03/25/2014  ? 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  ? Rash 01/28/2014  ? Pain in lower limb 01/24/2014  ? Keratosis 01/24/2014  ? Leg cramps 10/09/2013  ? Vaginitis and vulvovaginitis 10/09/2013  ? Ingrown nail 09/30/2013  ? Pain in toe of left foot 09/30/2013  ? Tenosynovitis of foot and ankle 09/03/2013  ? Metatarsal deformity, left 09/03/2013  ?  Pain in joint, ankle and foot 01/22/2013  ? Pes cavus 10/13/2012  ? Acquired tight Achilles tendon 10/13/2012  ? Paronychia 06/14/2012  ? 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 ([place]) 03/19/2012  ? Allergic rhinitis 03/19/2012  ? URI (upper respiratory infection) 08/30/2011  ? Myalgia 04/18/2011  ? SHOULDER PAIN 01/17/2011  ? TTS (tarsal tunnel syndrome)   ? GASTROENTERITIS 10/18/2010  ? ARTHRALGIA 01/04/2010  ? WEIGHT GAIN, ABNORMAL 01/04/2010  ? TACHYCARDIA 10/02/2009  ? CBC, ABNORMAL 05/01/2009  ? B12 deficiency 01/05/2009  ? GOUT 01/05/2009  ? Edema 01/05/2009  ? Anxiety disorder 07/28/2008  ? ELBOW PAIN 07/28/2008  ? TOBACCO USE DISORDER/SMOKER-SMOKING CESSATION DISCUSSED 04/21/2008  ? FOOT PAIN 04/21/2008  ? PARESTHESIA 04/21/2008  ? Abdominal pain 04/21/2008  ? SINUSITIS- ACUTE-NOS 11/13/2007  ? COPD exacerbation ([place]) 11/13/2007  ? Diabetic neuropathy ([place]) 08/27/2007  ? LOW BACK PAIN 08/27/2007  ? Dyslipidemia 08/20/2007  ? Hypothyroidism 05/22/2007  ? ?Conditions to be addressed/monitored:  HLD and DMII ? ?Care Plan : RN Care Manager Plan of Care  ?Updates made by Knox Royalty, RN since 03/02/2022 12:00 AM  ?   ? ?Problem: Chronic Disease Management Needs   ?Priority: High  ?  ? ?Long-Range Goal: Ongoing development of plan of care for long term chronic disease management   ?Start Date: 05/27/2021  ?Expected End Date

## 2022-03-02 NOTE — Patient Instructions (Signed)
Visit Information ? ?[place], [facility]) thank you for taking time to talk with me today. Please don't hesitate to contact me if I can be of assistance to you before our next scheduled telephone appointment ? ?Below are the goals we discussed today:  ?Patient Self-Care Activities: ?Patient Katherine [name] will: ?Take medications as prescribed ?Attend all scheduled provider appointments ?Call pharmacy for medication refills ?Call provider office for new concerns or questions ?Monitor and write down blood sugar several times per day: first thing in the morning before you eat and then again 2 hours after a meal, and at bedtime ?Your goal for fasting blood sugars at home is: less than or equal to 130 ?Your goal for after-eating blood sugars is: between 160-180, 2 hours after a regular sized meal ?Write these blood sugar values at home down so we can review them together during our future phone calls ?Check blood sugar more often if I feel it is too high or too low or if I feel sick ?Continue to follow heart healthy, low salt, low cholesterol, carbohydrate-modified, low sugar diet-- please try to limit your portion sizes ?Keep cholesterol low by eating the right foods and continuing to take prescribed medication for high cholesterol ?Continue taking care of your foot wound and attending podiatry (foot doctor) office visits ?Continue to try and prevent falls ? ?Our next scheduled telephone follow up visit/ appointment is scheduled on: Tuesday, Mar 02, 2022 at 10:30 am- This is a PHONE [place] appointment ? ?If you need to cancel or re-schedule our visit, please call [phone] and our care guide team will be happy to assist you. ?  ?I look forward to hearing about your progress. ?  ?Oneta Rack, RN, BSN, CCRN Alumnus ?[facility] ?([phone]: direct office ? ?If you are experiencing a Mental Health or [place] or need someone to talk to, please  ?call the Suicide  and Crisis Lifeline: 988 ?call the [place] National Suicide Prevention Lifeline: (774)148-5172 or TTY: 223-597-3783 TTY 585-534-0914) to talk to a trained counselor ?call 1-800-273-TALK (toll free, 24 hour hotline) ?go to [facility] [address], [place] [phone]) ?call 911  ? ?The patient verbalized understanding of instructions, educational materials, and care plan provided today and agreed to receive a mailed copy of patient instructions, educational materials, and care plan ? ?Osteomyelitis, Adult ? ?Bone infections, also called osteomyelitis,occur when bacteria or other germs get inside a bone. This can happen if you have an infection in another part of your body that spreads through your blood. It can also happen if you have a wound or a broken bone (fracture) that breaks the skin. A wound or a fracture can allow germs from your skin or from outside of your body to spread to your bone. ?Bone infections need to be treated quickly to: ?Prevent bone damage. ?Prevent the infection from spreading to other areas of your body. ?What are the causes? ?Most bone infections are caused by bacteria. The most common bacteria is one found on the skin (staphylococcus). Bone infections can also be caused by other germs, such as viruses and funguses. ?What increases the risk? ?You are more likely to develop this condition if: ?You recently had surgery, especially bone or joint surgery. ?You have had an injury, such as stepping on a nail or having a fracture that exposes bones through the skin. ?You have a long-term (chronic) disease, such as: ?Diabetes. ?HIV (human immunodeficiency virus). ?Rheumatoid  arthritis. ?Sickle cell anemia. ?Kidney disease that requires dialysis. ?You have a condition that affects your body's defense system (immune system) or you take medicines that block or weaken the immune system. ?You have a condition that reduces your blood flow. ?You have an  artificial joint. ?You have had a joint or bone repaired with plates or screws. ?You use IV drugs. ?What are the signs or symptoms? ?Symptoms vary depending on the type and location of your infection. Common symptoms of bone infections include: ?Fever and chills. ?Skin redness and warmth. ?Swelling. ?Pain and stiffness. ?Drainage of fluid or pus near the infection. ?How is this diagnosed? ?This condition may be diagnosed based on: ?Your symptoms and medical history. ?A physical exam. ?Tests, such as: ?A sample of tissue, fluid, or blood taken to be examined under a microscope. ?Pus or discharge swabbed from a wound for testing. This is to identify the type of germs and to determine what type of medicine will kill them. ?Blood tests. ?Imaging studies, including X-rays, MRI, CT scan, bone scan, or ultrasound. ?How is this treated? ?Treatment for this condition depends on the cause and type of infection. Antibiotic medicines are usually the first treatment for a bone infection. This may be done in a hospital at first. You may have to continue IV antibiotics at home or take antibiotics by mouth for several weeks after that. ?Other treatments may include surgery to: ?Remove dead or dying tissue from a bone. ?Remove an infected artificial joint. ?Remove infected plates or screws that were used to repair a broken bone. ?Follow these instructions at home: ?Medicines ?Take over-the-counter and prescription medicines as told by your health care provider. Finish all antibiotic medicine even if you start to feel better. ?Follow instructions from your health care provider about how to take IV antibiotics at home. You may need to have a nurse come to your home to give you the IV antibiotics. ?Managing pain, stiffness, and swelling ?If directed, put ice on the affected area. To do this: ?Put ice in a plastic bag. ?Place a towel between your skin and the bag. ?Leave the ice on for 20 minutes, 2-3 times a day. ? ?General  instructions ?Ask your health care provider if you have any restrictions on your activities. ?Do not use any products that contain nicotine or tobacco, such as cigarettes, e-cigarettes, and chewing tobacco. If you need help quitting, ask your health care provider. ?Keep all follow-up visits as told by your health care provider. This is important. ?How is this prevented? ?Wash your hands often to stop the spread of germs. Wash your hands for at least 20 seconds with soap and water. If soap and water are not available, use hand sanitizer. ?Keep any open areas, cuts, or wounds clean. Apply a clean bandage after cleaning the area. ?Check wounds frequently for signs of infections. Signs of infection include redness, swelling, warmth, pus, or a bad smell. ?Wear proper footwear to avoid injuries to the feet. ?Contact a health care provider if: ?You develop a fever or chills. ?You have redness, warmth, pain, or swelling that returns after treatment. ?Get help right away if: ?You have rapid breathing or you have trouble breathing. ?You have chest pain. ?You cannot drink fluids or make urine. ?The affected area swells, changes color, or turns blue. ?You have numbness or severe pain in the affected area. ?These symptoms may represent a serious problem that is an emergency. Do not wait to see if the symptoms will go away. Get medical  help right away. Call your local emergency services (911 in the [place] Do not drive yourself to the hospital. ?Summary ?Bone infections, also called osteomyelitis,occur when bacteria or other germs get inside a bone. ?You are more likely to get this type of infection if you have a condition that lowers your ability to fight infections. You are also likely to get this condition if you take medicines that block or weaken the immune system. ?Most bone infections are caused by bacteria. They can also be caused by other germs, such as viruses and funguses. ?Treatment for this condition usually starts  with taking antibiotics. Further treatment depends on the cause and type of infection. ?This information is not intended to replace advice given to you by your health care provider. Make sure you discuss any questions you

## 2022-03-04 ENCOUNTER — Ambulatory Visit (INDEPENDENT_AMBULATORY_CARE_PROVIDER_SITE_OTHER): Payer: Medicare Other

## 2022-03-04 ENCOUNTER — Ambulatory Visit (INDEPENDENT_AMBULATORY_CARE_PROVIDER_SITE_OTHER): Payer: Medicare Other | Admitting: Podiatry

## 2022-03-04 DIAGNOSIS — L97412 Non-pressure chronic ulcer of right heel and midfoot with fat layer exposed: Secondary | ICD-10-CM

## 2022-03-04 DIAGNOSIS — E11621 Type 2 diabetes mellitus with foot ulcer: Secondary | ICD-10-CM

## 2022-03-07 DIAGNOSIS — Z23 Encounter for immunization: Secondary | ICD-10-CM | POA: Diagnosis not present

## 2022-03-07 DIAGNOSIS — N189 Chronic kidney disease, unspecified: Secondary | ICD-10-CM | POA: Diagnosis not present

## 2022-03-07 DIAGNOSIS — I10 Essential (primary) hypertension: Secondary | ICD-10-CM | POA: Diagnosis not present

## 2022-03-07 DIAGNOSIS — E1140 Type 2 diabetes mellitus with diabetic neuropathy, unspecified: Secondary | ICD-10-CM | POA: Diagnosis not present

## 2022-03-07 DIAGNOSIS — E1165 Type 2 diabetes mellitus with hyperglycemia: Secondary | ICD-10-CM | POA: Diagnosis not present

## 2022-03-07 DIAGNOSIS — E7800 Pure hypercholesterolemia, unspecified: Secondary | ICD-10-CM | POA: Diagnosis not present

## 2022-03-07 DIAGNOSIS — E039 Hypothyroidism, unspecified: Secondary | ICD-10-CM | POA: Diagnosis not present

## 2022-03-08 DIAGNOSIS — E1165 Type 2 diabetes mellitus with hyperglycemia: Secondary | ICD-10-CM | POA: Diagnosis not present

## 2022-03-08 NOTE — Progress Notes (Signed)
Subjective: ?56 year old female presents the office today for follow evaluation of a wound on the right foot.  She previously underwent surgery last year with Dr. March Rummage under the care of Dr. Posey Pronto.  He apparently was concern for bone infection or MRI was ordered.  Patient states that she been changing the bandage every other day with Betadine.  She does not see any swelling or redness.  She denies any fevers or chills. ? ?As a side note she was scheduled to see Dr. Posey Pronto today however there was an issue with her being checked in so therefore I was happy to see her today  ? ?Objective: ?AAO x3, NAD ?DP/PT pulses decreased bilaterally ?Ulceration present the fifth metatarsal base today measuring smaller compared to last note measuring 0.8 x 0.6 x 0.1 cm.  Hyperkeratotic periwound.  There is no straight erythema, ascending cellulitis.  No fluctuance or crepitation.  There is no malodor.  No pain with calf compression, swelling, warmth, erythema ? ? ?Assessment: ?Chronic ulceration right foot ? ?Plan: ?-All treatment options discussed with the patient including all alternatives, risks, complications.  ?-ABI previously performed by Dr. Posey Pronto ?-X-rays obtained reviewed.  3 views of the right foot were obtained.  There is chronic changes present of the fifth metatarsal base.  Cannot rule out chronic osteomyelitis.  No soft tissue edema. ?-Sharply debrided the wound utilizing #312 with scalpel any complications or bleeding.  Pre and post wound measurements were the same.  Debrided the wound and the healthy, granular tissue remove any nonviable, devitalized tissue in order to promote wound healing.  There was minimal blood sent and hemostasis sheath or manual compression.  I cleansed the wound with wound cleanser.  Medihoney was applied followed by dressing.  I want her to continue with daily dressing changes with the collagen, plastics.  Continue with the foot defender boot for offloading. ?-MRI scheduled ?-No obvious signs  of infection today.  She previously finished course of antibiotics which she states was not helpful.  We will hold further antibiotic until the MRI. ?-Patient encouraged to call the office with any questions, concerns, change in symptoms.  ? ?Trula Slade DPM ? ?

## 2022-03-11 ENCOUNTER — Ambulatory Visit
Admission: RE | Admit: 2022-03-11 | Discharge: 2022-03-11 | Disposition: A | Discharge status: Home / Self Care | Payer: Medicare Other | Source: Ambulatory Visit | Attending: Podiatry | Admitting: Podiatry

## 2022-03-11 DIAGNOSIS — L97519 Non-pressure chronic ulcer of other part of right foot with unspecified severity: Secondary | ICD-10-CM | POA: Diagnosis not present

## 2022-03-11 DIAGNOSIS — R600 Localized edema: Secondary | ICD-10-CM | POA: Diagnosis not present

## 2022-03-11 DIAGNOSIS — M868X7 Other osteomyelitis, ankle and foot: Secondary | ICD-10-CM | POA: Diagnosis not present

## 2022-03-11 DIAGNOSIS — E11621 Type 2 diabetes mellitus with foot ulcer: Secondary | ICD-10-CM

## 2022-03-11 MED ORDER — GADOBENATE DIMEGLUMINE 529 MG/ML IV SOLN
20.0000 mL | Freq: Once | INTRAVENOUS | Status: AC | PRN
  Administered 2022-03-11: 20 mL via INTRAVENOUS

## 2022-03-16 DIAGNOSIS — L97412 Non-pressure chronic ulcer of right heel and midfoot with fat layer exposed: Secondary | ICD-10-CM | POA: Diagnosis not present

## 2022-03-16 DIAGNOSIS — E11621 Type 2 diabetes mellitus with foot ulcer: Secondary | ICD-10-CM | POA: Diagnosis not present

## 2022-03-17 LAB — CBC WITH DIFFERENTIAL
Basophils Absolute: 0.1 10*3/uL (ref 0.0–0.2)
Basos: 1 %
EOS (ABSOLUTE): 0.2 10*3/uL (ref 0.0–0.4)
Eos: 2 %
Hematocrit: 46.4 % (ref 34.0–46.6)
Hemoglobin: 15.5 g/dL (ref 11.1–15.9)
Immature Grans (Abs): 0 10*3/uL (ref 0.0–0.1)
Immature Granulocytes: 0 %
Lymphocytes Absolute: 2.7 10*3/uL (ref 0.7–3.1)
Lymphs: 28 %
MCH: 28.1 pg (ref 26.6–33.0)
MCHC: 33.4 g/dL (ref 31.5–35.7)
MCV: 84 fL (ref 79–97)
Monocytes Absolute: 0.5 10*3/uL (ref 0.1–0.9)
Monocytes: 5 %
Neutrophils Absolute: 6.1 10*3/uL (ref 1.4–7.0)
Neutrophils: 64 %
RBC: 5.51 x10E6/uL — ABNORMAL HIGH (ref 3.77–5.28)
RDW: 13.6 % (ref 11.7–15.4)
WBC: 9.6 10*3/uL (ref 3.4–10.8)

## 2022-03-17 LAB — COMPREHENSIVE METABOLIC PANEL
ALT: 15 IU/L (ref 0–32)
AST: 17 IU/L (ref 0–40)
Albumin/Globulin Ratio: 1.2 (ref 1.2–2.2)
Albumin: 4.2 g/dL (ref 3.8–4.9)
Alkaline Phosphatase: 115 IU/L (ref 44–121)
BUN/Creatinine Ratio: 14 (ref 9–23)
BUN: 17 mg/dL (ref 6–24)
Bilirubin Total: 0.3 mg/dL (ref 0.0–1.2)
CO2: 24 mmol/L (ref 20–29)
Calcium: 9.4 mg/dL (ref 8.7–10.2)
Chloride: 100 mmol/L (ref 96–106)
Creatinine, Ser: 1.18 mg/dL — ABNORMAL HIGH (ref 0.57–1.00)
Globulin, Total: 3.5 g/dL (ref 1.5–4.5)
Glucose: 103 mg/dL — ABNORMAL HIGH (ref 70–99)
Potassium: 5 mmol/L (ref 3.5–5.2)
Sodium: 139 mmol/L (ref 134–144)
Total Protein: 7.7 g/dL (ref 6.0–8.5)
eGFR: 54 mL/min/{1.73_m2} — ABNORMAL LOW (ref >59)

## 2022-03-17 LAB — C-REACTIVE PROTEIN: CRP: 6 mg/L (ref 0–10)

## 2022-03-17 LAB — SEDIMENTATION RATE: Sed Rate: 34 mm/hr (ref 0–40)

## 2022-03-22 ENCOUNTER — Telehealth: Payer: Self-pay | Admitting: *Deleted

## 2022-03-22 ENCOUNTER — Telehealth: Payer: Medicare Other

## 2022-03-22 ENCOUNTER — Encounter: Payer: Self-pay | Admitting: *Deleted

## 2022-03-22 NOTE — Telephone Encounter (Signed)
  Chronic Care Management   Follow Up Note   03/22/2022 Name: Katherine Herrera MRN: [id number] DOB: 02/13/1966  Referred by: Cassandria Anger, MD Reason for referral : Chronic Care Management (CCM RN CM Follow Up Telephone Visit attempt- Unsuccessful)  An unsuccessful telephone outreach was attempted today. The patient was referred to the case management team for assistance with care management and care coordination.   Follow Up Plan:  Patient answered phone today, and immediately requested call back "later this week" to re-schedule: she does not wish to re-schedule while I am on phone, states, "I am not not near my calendar and I can't talk right now;" sent to scheduling care guide per patient request  Oneta Rack, RN, BSN, [place] [phone]: direct office

## 2022-03-25 ENCOUNTER — Encounter: Payer: Self-pay | Admitting: Internal Medicine

## 2022-03-25 ENCOUNTER — Ambulatory Visit (INDEPENDENT_AMBULATORY_CARE_PROVIDER_SITE_OTHER): Payer: Medicare Other | Admitting: Podiatry

## 2022-03-25 DIAGNOSIS — L97412 Non-pressure chronic ulcer of right heel and midfoot with fat layer exposed: Secondary | ICD-10-CM

## 2022-03-25 DIAGNOSIS — E11621 Type 2 diabetes mellitus with foot ulcer: Secondary | ICD-10-CM

## 2022-03-25 DIAGNOSIS — E1142 Type 2 diabetes mellitus with diabetic polyneuropathy: Secondary | ICD-10-CM | POA: Diagnosis not present

## 2022-03-30 ENCOUNTER — Telehealth: Payer: Self-pay | Admitting: *Deleted

## 2022-03-30 DIAGNOSIS — L97508 Non-pressure chronic ulcer of other part of unspecified foot with other specified severity: Secondary | ICD-10-CM

## 2022-03-30 DIAGNOSIS — E11621 Type 2 diabetes mellitus with foot ulcer: Secondary | ICD-10-CM | POA: Diagnosis not present

## 2022-03-30 DIAGNOSIS — E785 Hyperlipidemia, unspecified: Secondary | ICD-10-CM

## 2022-03-30 DIAGNOSIS — Z794 Long term (current) use of insulin: Secondary | ICD-10-CM | POA: Diagnosis not present

## 2022-03-30 NOTE — Telephone Encounter (Signed)
-----   Message from Felipa Furnace, DPM sent at 03/30/2022 12:25 PM EDT ----- Regarding: Referral to the wound care center [facility],   Can you refer this patient to the wound care center at [facility].  I placed the order

## 2022-03-30 NOTE — Telephone Encounter (Signed)
[place] to schedule, has upcoming appointment June 8th'@8'$ :30,arrival time is : 8:15,patient has been notified.

## 2022-03-30 NOTE — Progress Notes (Signed)
  Subjective:  Patient ID: Katherine Herrera, female    DOB: [birth year],  MRN: [id number]  Chief Complaint  Patient presents with   Wound Check   DOS: 05/12/21 Procedure: RT FOOT WOUND DEBRIDMENT & IRRIGATION W/POSS SKIN GRAFT SUB, PARTIAL EXCSION OF 5TH METATARSAL BASE BONE, FLEXOR TENOTOMY, ARTHROPLASTY OF 2ND TOE    56 y.o. female presents with the above complaint.  Patient presents with follow-up to right submetatarsal 5 ulceration with fat layer exposed.  Her pain has gone down.  She has been applying blast X she denies any other acute complaints.  Objective:  Physical Exam: no tenderness at the surgical site, local edema noted, and calf supple, nontender.  Nonpalpable DP PT bilaterally.  Diminished cap refill.  No hair growth noted. Incision: healed wound 2nd toe Plantar submetatarsal 5 wound noted with measurement below.  Fibrogranular wound.  Cavovarus foot structure.  No malodor present no purulent drainage noted.   IMPRESSION: 1. Chronic wound at the lateral aspect of the fifth TMT joint with underlying chronic osteomyelitis of the cuboid and fourth and fifth metatarsal bases. No abscess. Assessment:   1. Diabetic ulcer of right midfoot associated with type 2 diabetes mellitus, with fat layer exposed ([place])   2. DM type 2 with diabetic peripheral neuropathy ([place])       Plan:  Patient was evaluated and treated and all questions answered.  Ulcer right plantar foot with fat layer exposed submetatarsal 5 -Wound cleansed and debrided -Wound slightly larger than previous. -Continue doxycycline -Offload ulcer with surgical shoe. Hold of DM shoes right. -Continue applying blast X -MRI was reviewed which shows chronic osteomyelitis of multiple bones.  Given that there is no acute involvement we will hold off on any surgical intervention at this time.  I am hopeful that if enough pressures taking off she can heal the wound. -I believe she will benefit from a referral to the wound  care center for advanced wound care. -Given the nature of the wound and the location of patient is a high risk of undergoing below the knee amputation.   Vascular abnormality -ABIs PVRs were reviewed.  Patient may have adequate flow to heal the wound.   Procedure: Selective Debridement of Wound~stagnant Rationale: Removal of devitalized tissue from the wound to promote healing.  Pre-Debridement Wound Measurements: 0.9 cm x 0.9 cm x 0.2 Post-Debridement Wound Measurements: same as pre-debridement. Type of Debridement: sharp selective Instrumentation: dermal curette, tissue nipper Tissue Removed: Devitalized soft-tissue Dressing: Dry, sterile, compression dressing. Disposition: Patient tolerated procedure well.   No follow-ups on file.

## 2022-04-07 ENCOUNTER — Emergency Department (HOSPITAL_COMMUNITY)
Admission: EM | Admit: 2022-04-07 | Discharge: 2022-04-07 | Discharge status: Against Medical Advice | Payer: Medicare Other | Source: Home / Self Care

## 2022-04-07 ENCOUNTER — Telehealth: Payer: Self-pay | Admitting: *Deleted

## 2022-04-07 ENCOUNTER — Encounter (HOSPITAL_BASED_OUTPATIENT_CLINIC_OR_DEPARTMENT_OTHER): Payer: Medicare Other | Attending: Internal Medicine | Admitting: Internal Medicine

## 2022-04-07 DIAGNOSIS — E1142 Type 2 diabetes mellitus with diabetic polyneuropathy: Secondary | ICD-10-CM | POA: Diagnosis not present

## 2022-04-07 DIAGNOSIS — E1169 Type 2 diabetes mellitus with other specified complication: Secondary | ICD-10-CM | POA: Diagnosis not present

## 2022-04-07 DIAGNOSIS — M86671 Other chronic osteomyelitis, right ankle and foot: Secondary | ICD-10-CM | POA: Diagnosis not present

## 2022-04-07 DIAGNOSIS — J449 Chronic obstructive pulmonary disease, unspecified: Secondary | ICD-10-CM | POA: Diagnosis not present

## 2022-04-07 DIAGNOSIS — E1165 Type 2 diabetes mellitus with hyperglycemia: Secondary | ICD-10-CM | POA: Diagnosis not present

## 2022-04-07 DIAGNOSIS — Z882 Allergy status to sulfonamides status: Secondary | ICD-10-CM | POA: Diagnosis not present

## 2022-04-07 DIAGNOSIS — K5909 Other constipation: Secondary | ICD-10-CM | POA: Diagnosis not present

## 2022-04-07 DIAGNOSIS — M79661 Pain in right lower leg: Secondary | ICD-10-CM | POA: Insufficient documentation

## 2022-04-07 DIAGNOSIS — Z5321 Procedure and treatment not carried out due to patient leaving prior to being seen by health care provider: Secondary | ICD-10-CM | POA: Insufficient documentation

## 2022-04-07 DIAGNOSIS — M79671 Pain in right foot: Secondary | ICD-10-CM | POA: Diagnosis not present

## 2022-04-07 DIAGNOSIS — E039 Hypothyroidism, unspecified: Secondary | ICD-10-CM | POA: Diagnosis not present

## 2022-04-07 DIAGNOSIS — Z981 Arthrodesis status: Secondary | ICD-10-CM | POA: Diagnosis not present

## 2022-04-07 DIAGNOSIS — M869 Osteomyelitis, unspecified: Secondary | ICD-10-CM | POA: Diagnosis not present

## 2022-04-07 DIAGNOSIS — Z881 Allergy status to other antibiotic agents status: Secondary | ICD-10-CM | POA: Diagnosis not present

## 2022-04-07 DIAGNOSIS — Z79899 Other long term (current) drug therapy: Secondary | ICD-10-CM | POA: Diagnosis not present

## 2022-04-07 DIAGNOSIS — F17210 Nicotine dependence, cigarettes, uncomplicated: Secondary | ICD-10-CM | POA: Diagnosis not present

## 2022-04-07 DIAGNOSIS — G894 Chronic pain syndrome: Secondary | ICD-10-CM | POA: Diagnosis not present

## 2022-04-07 DIAGNOSIS — Z885 Allergy status to narcotic agent status: Secondary | ICD-10-CM | POA: Diagnosis not present

## 2022-04-07 DIAGNOSIS — E559 Vitamin D deficiency, unspecified: Secondary | ICD-10-CM | POA: Diagnosis not present

## 2022-04-07 DIAGNOSIS — Z794 Long term (current) use of insulin: Secondary | ICD-10-CM | POA: Diagnosis not present

## 2022-04-07 DIAGNOSIS — L97516 Non-pressure chronic ulcer of other part of right foot with bone involvement without evidence of necrosis: Secondary | ICD-10-CM | POA: Diagnosis not present

## 2022-04-07 DIAGNOSIS — E11649 Type 2 diabetes mellitus with hypoglycemia without coma: Secondary | ICD-10-CM | POA: Diagnosis not present

## 2022-04-07 DIAGNOSIS — I10 Essential (primary) hypertension: Secondary | ICD-10-CM | POA: Diagnosis not present

## 2022-04-07 DIAGNOSIS — L97519 Non-pressure chronic ulcer of other part of right foot with unspecified severity: Secondary | ICD-10-CM | POA: Diagnosis not present

## 2022-04-07 DIAGNOSIS — N179 Acute kidney failure, unspecified: Secondary | ICD-10-CM | POA: Diagnosis not present

## 2022-04-07 DIAGNOSIS — E7800 Pure hypercholesterolemia, unspecified: Secondary | ICD-10-CM | POA: Diagnosis not present

## 2022-04-07 DIAGNOSIS — E11621 Type 2 diabetes mellitus with foot ulcer: Secondary | ICD-10-CM | POA: Diagnosis not present

## 2022-04-07 DIAGNOSIS — M86171 Other acute osteomyelitis, right ankle and foot: Secondary | ICD-10-CM | POA: Diagnosis not present

## 2022-04-07 NOTE — ED Provider Triage Note (Cosign Needed)
Emergency Medicine Provider Triage Evaluation Note  Katherine Herrera , a 56 y.o. female  was evaluated in triage.  Pt complains of right lower extremity pain.  Patient has a chronic wound and ulcer, followed by podiatry and wound center.  Patient has had an MRI which showed chronic osteomyelitis, patient was sent from wound clinic to ED for IV antibiotics and possible debridement..  Review of Systems  Per HPI  Physical Exam  BP 140/65   Pulse (!) 101   Temp 98 F (36.7 C) (Oral)   Resp 16   SpO2 94%  Gen:   Awake, no distress   Resp:  Normal effort  MSK:   Moves extremities without difficulty  Other:  Right lower extremity with cellulitis, pulses palpable with Doppler.  Medical Decision Making  Medically screening exam initiated at 8:26 PM.  Appropriate orders placed.  NAFEESA DILS was informed that the remainder of the evaluation will be completed by another provider, this initial triage assessment does not replace that evaluation, and the importance of remaining in the ED until their evaluation is complete.    Sherrill Raring, PA-C 04/07/22 2027

## 2022-04-07 NOTE — Telephone Encounter (Signed)
Patient is calling to let physician know that the wound center is sending her to hospital for IV antibiotic and they are asking that doctor contact them to let them know ahead of time. She will be going around 7 today. Please advise.

## 2022-04-07 NOTE — ED Notes (Signed)
Pt seen walking out of triage room,pt stated, "I dont want to go through this emergency room stuff."

## 2022-04-08 ENCOUNTER — Encounter (HOSPITAL_COMMUNITY): Payer: Self-pay

## 2022-04-08 ENCOUNTER — Emergency Department (HOSPITAL_COMMUNITY): Payer: Medicare Other

## 2022-04-08 ENCOUNTER — Telehealth: Payer: Self-pay | Admitting: Podiatry

## 2022-04-08 ENCOUNTER — Inpatient Hospital Stay (HOSPITAL_COMMUNITY)
Admission: EM | Admit: 2022-04-08 | Discharge: 2022-04-19 | DRG: 629 | Disposition: A | Discharge status: Skilled Nursing Facility | Payer: Medicare Other | Attending: Internal Medicine | Admitting: Internal Medicine

## 2022-04-08 ENCOUNTER — Other Ambulatory Visit: Payer: Self-pay

## 2022-04-08 DIAGNOSIS — E7800 Pure hypercholesterolemia, unspecified: Secondary | ICD-10-CM | POA: Diagnosis not present

## 2022-04-08 DIAGNOSIS — Z981 Arthrodesis status: Secondary | ICD-10-CM

## 2022-04-08 DIAGNOSIS — I96 Gangrene, not elsewhere classified: Secondary | ICD-10-CM | POA: Diagnosis not present

## 2022-04-08 DIAGNOSIS — Z833 Family history of diabetes mellitus: Secondary | ICD-10-CM

## 2022-04-08 DIAGNOSIS — B9689 Other specified bacterial agents as the cause of diseases classified elsewhere: Secondary | ICD-10-CM | POA: Diagnosis not present

## 2022-04-08 DIAGNOSIS — L089 Local infection of the skin and subcutaneous tissue, unspecified: Secondary | ICD-10-CM | POA: Diagnosis not present

## 2022-04-08 DIAGNOSIS — E559 Vitamin D deficiency, unspecified: Secondary | ICD-10-CM | POA: Diagnosis present

## 2022-04-08 DIAGNOSIS — G8929 Other chronic pain: Secondary | ICD-10-CM | POA: Diagnosis present

## 2022-04-08 DIAGNOSIS — L97514 Non-pressure chronic ulcer of other part of right foot with necrosis of bone: Secondary | ICD-10-CM | POA: Diagnosis not present

## 2022-04-08 DIAGNOSIS — M86171 Other acute osteomyelitis, right ankle and foot: Secondary | ICD-10-CM | POA: Diagnosis not present

## 2022-04-08 DIAGNOSIS — E1140 Type 2 diabetes mellitus with diabetic neuropathy, unspecified: Secondary | ICD-10-CM | POA: Diagnosis present

## 2022-04-08 DIAGNOSIS — Z882 Allergy status to sulfonamides status: Secondary | ICD-10-CM | POA: Diagnosis not present

## 2022-04-08 DIAGNOSIS — Z751 Person awaiting admission to adequate facility elsewhere: Secondary | ICD-10-CM

## 2022-04-08 DIAGNOSIS — E11621 Type 2 diabetes mellitus with foot ulcer: Secondary | ICD-10-CM | POA: Diagnosis present

## 2022-04-08 DIAGNOSIS — E1142 Type 2 diabetes mellitus with diabetic polyneuropathy: Secondary | ICD-10-CM | POA: Diagnosis present

## 2022-04-08 DIAGNOSIS — Z881 Allergy status to other antibiotic agents status: Secondary | ICD-10-CM | POA: Diagnosis not present

## 2022-04-08 DIAGNOSIS — K5909 Other constipation: Secondary | ICD-10-CM | POA: Diagnosis present

## 2022-04-08 DIAGNOSIS — N179 Acute kidney failure, unspecified: Secondary | ICD-10-CM | POA: Diagnosis not present

## 2022-04-08 DIAGNOSIS — M79671 Pain in right foot: Secondary | ICD-10-CM | POA: Diagnosis not present

## 2022-04-08 DIAGNOSIS — F419 Anxiety disorder, unspecified: Secondary | ICD-10-CM | POA: Diagnosis present

## 2022-04-08 DIAGNOSIS — Z90722 Acquired absence of ovaries, bilateral: Secondary | ICD-10-CM

## 2022-04-08 DIAGNOSIS — I10 Essential (primary) hypertension: Secondary | ICD-10-CM | POA: Diagnosis present

## 2022-04-08 DIAGNOSIS — E039 Hypothyroidism, unspecified: Secondary | ICD-10-CM | POA: Diagnosis present

## 2022-04-08 DIAGNOSIS — Z885 Allergy status to narcotic agent status: Secondary | ICD-10-CM | POA: Diagnosis not present

## 2022-04-08 DIAGNOSIS — J449 Chronic obstructive pulmonary disease, unspecified: Secondary | ICD-10-CM | POA: Diagnosis not present

## 2022-04-08 DIAGNOSIS — L97519 Non-pressure chronic ulcer of other part of right foot with unspecified severity: Secondary | ICD-10-CM | POA: Diagnosis not present

## 2022-04-08 DIAGNOSIS — Z90710 Acquired absence of both cervix and uterus: Secondary | ICD-10-CM

## 2022-04-08 DIAGNOSIS — M869 Osteomyelitis, unspecified: Secondary | ICD-10-CM | POA: Diagnosis not present

## 2022-04-08 DIAGNOSIS — E1169 Type 2 diabetes mellitus with other specified complication: Principal | ICD-10-CM | POA: Diagnosis present

## 2022-04-08 DIAGNOSIS — E11649 Type 2 diabetes mellitus with hypoglycemia without coma: Secondary | ICD-10-CM | POA: Diagnosis present

## 2022-04-08 DIAGNOSIS — Z794 Long term (current) use of insulin: Secondary | ICD-10-CM

## 2022-04-08 DIAGNOSIS — Z79899 Other long term (current) drug therapy: Secondary | ICD-10-CM | POA: Diagnosis not present

## 2022-04-08 DIAGNOSIS — Z9079 Acquired absence of other genital organ(s): Secondary | ICD-10-CM

## 2022-04-08 DIAGNOSIS — M6259 Muscle wasting and atrophy, not elsewhere classified, multiple sites: Secondary | ICD-10-CM | POA: Diagnosis not present

## 2022-04-08 DIAGNOSIS — Z888 Allergy status to other drugs, medicaments and biological substances status: Secondary | ICD-10-CM

## 2022-04-08 DIAGNOSIS — M21962 Unspecified acquired deformity of left lower leg: Secondary | ICD-10-CM | POA: Diagnosis not present

## 2022-04-08 DIAGNOSIS — F17210 Nicotine dependence, cigarettes, uncomplicated: Secondary | ICD-10-CM | POA: Diagnosis present

## 2022-04-08 DIAGNOSIS — M6281 Muscle weakness (generalized): Secondary | ICD-10-CM | POA: Diagnosis not present

## 2022-04-08 DIAGNOSIS — G894 Chronic pain syndrome: Secondary | ICD-10-CM | POA: Diagnosis present

## 2022-04-08 DIAGNOSIS — L02611 Cutaneous abscess of right foot: Secondary | ICD-10-CM | POA: Diagnosis not present

## 2022-04-08 DIAGNOSIS — E1165 Type 2 diabetes mellitus with hyperglycemia: Secondary | ICD-10-CM | POA: Diagnosis not present

## 2022-04-08 DIAGNOSIS — R262 Difficulty in walking, not elsewhere classified: Secondary | ICD-10-CM | POA: Diagnosis not present

## 2022-04-08 DIAGNOSIS — Z79890 Hormone replacement therapy (postmenopausal): Secondary | ICD-10-CM | POA: Diagnosis not present

## 2022-04-08 DIAGNOSIS — L97509 Non-pressure chronic ulcer of other part of unspecified foot with unspecified severity: Secondary | ICD-10-CM | POA: Diagnosis not present

## 2022-04-08 DIAGNOSIS — Z741 Need for assistance with personal care: Secondary | ICD-10-CM | POA: Diagnosis not present

## 2022-04-08 DIAGNOSIS — M86671 Other chronic osteomyelitis, right ankle and foot: Secondary | ICD-10-CM | POA: Diagnosis present

## 2022-04-08 DIAGNOSIS — G8918 Other acute postprocedural pain: Secondary | ICD-10-CM | POA: Diagnosis not present

## 2022-04-08 LAB — CBC WITH DIFFERENTIAL/PLATELET
Abs Immature Granulocytes: 0.03 10*3/uL (ref 0.00–0.07)
Basophils Absolute: 0.1 10*3/uL (ref 0.0–0.1)
Basophils Relative: 1 %
Eosinophils Absolute: 0.1 10*3/uL (ref 0.0–0.5)
Eosinophils Relative: 1 %
HCT: 43.7 % (ref 36.0–46.0)
Hemoglobin: 14.2 g/dL (ref 12.0–15.0)
Immature Granulocytes: 0 %
Lymphocytes Relative: 15 %
Lymphs Abs: 1.4 10*3/uL (ref 0.7–4.0)
MCH: 28.6 pg (ref 26.0–34.0)
MCHC: 32.5 g/dL (ref 30.0–36.0)
MCV: 87.9 fL (ref 80.0–100.0)
Monocytes Absolute: 0.6 10*3/uL (ref 0.1–1.0)
Monocytes Relative: 6 %
Neutro Abs: 7.3 10*3/uL (ref 1.7–7.7)
Neutrophils Relative %: 77 %
Platelets: 261 10*3/uL (ref 150–400)
RBC: 4.97 MIL/uL (ref 3.87–5.11)
RDW: 13.9 % (ref 11.5–15.5)
WBC: 9.4 10*3/uL (ref 4.0–10.5)
nRBC: 0 % (ref 0.0–0.2)

## 2022-04-08 LAB — COMPREHENSIVE METABOLIC PANEL
ALT: 12 U/L (ref 0–44)
AST: 12 U/L — ABNORMAL LOW (ref 15–41)
Albumin: 3.2 g/dL — ABNORMAL LOW (ref 3.5–5.0)
Alkaline Phosphatase: 96 U/L (ref 38–126)
Anion gap: 8 (ref 5–15)
BUN: 18 mg/dL (ref 6–20)
CO2: 28 mmol/L (ref 22–32)
Calcium: 8.8 mg/dL — ABNORMAL LOW (ref 8.9–10.3)
Chloride: 99 mmol/L (ref 98–111)
Creatinine, Ser: 1.24 mg/dL — ABNORMAL HIGH (ref 0.44–1.00)
GFR, Estimated: 51 mL/min — ABNORMAL LOW (ref >60)
Glucose, Bld: 402 mg/dL — ABNORMAL HIGH (ref 70–99)
Potassium: 4.7 mmol/L (ref 3.5–5.1)
Sodium: 135 mmol/L (ref 135–145)
Total Bilirubin: 0.4 mg/dL (ref 0.3–1.2)
Total Protein: 7.2 g/dL (ref 6.5–8.1)

## 2022-04-08 LAB — CBG MONITORING, ED
Glucose-Capillary: 516 mg/dL (ref 70–99)
Glucose-Capillary: 395 mg/dL — ABNORMAL HIGH (ref 70–99)

## 2022-04-08 LAB — LACTIC ACID, PLASMA: Lactic Acid, Venous: 1.1 mmol/L (ref 0.5–1.9)

## 2022-04-08 LAB — GLUCOSE, CAPILLARY: Glucose-Capillary: 230 mg/dL — ABNORMAL HIGH (ref 70–99)

## 2022-04-08 MED ORDER — VANCOMYCIN HCL 1250 MG/250ML IV SOLN
1250.0000 mg | INTRAVENOUS | Status: DC
  Filled 2022-04-08: qty 250

## 2022-04-08 MED ORDER — INSULIN ASPART PROT & ASPART (70-30 MIX) 100 UNIT/ML ~~LOC~~ SUSP
40.0000 [IU] | Freq: Every day | SUBCUTANEOUS | Status: DC
Start: 2022-04-08 — End: 2022-04-09
  Administered 2022-04-09: 40 [IU] via SUBCUTANEOUS
  Filled 2022-04-08: qty 10

## 2022-04-08 MED ORDER — EZETIMIBE 10 MG PO TABS
10.0000 mg | ORAL_TABLET | Freq: Every day | ORAL | Status: DC
  Administered 2022-04-08 – 2022-04-19 (×12): 10 mg via ORAL
  Filled 2022-04-08 (×12): qty 1

## 2022-04-08 MED ORDER — SODIUM CHLORIDE 0.9 % IV BOLUS
500.0000 mL | Freq: Once | INTRAVENOUS | Status: AC
  Administered 2022-04-08: 500 mL via INTRAVENOUS

## 2022-04-08 MED ORDER — MORPHINE SULFATE (PF) 2 MG/ML IV SOLN
1.0000 mg | INTRAVENOUS | Status: DC | PRN
  Administered 2022-04-08 – 2022-04-18 (×21): 1 mg via INTRAVENOUS
  Filled 2022-04-08 (×22): qty 1

## 2022-04-08 MED ORDER — ROPINIROLE HCL 0.5 MG PO TABS
0.5000 mg | ORAL_TABLET | Freq: Every day | ORAL | Status: DC
  Administered 2022-04-09 – 2022-04-19 (×11): 0.5 mg via ORAL
  Filled 2022-04-08 (×11): qty 1

## 2022-04-08 MED ORDER — FENTANYL CITRATE PF 50 MCG/ML IJ SOSY
25.0000 ug | PREFILLED_SYRINGE | Freq: Once | INTRAMUSCULAR | Status: AC
  Administered 2022-04-08: 25 ug via INTRAVENOUS
  Filled 2022-04-08: qty 1

## 2022-04-08 MED ORDER — BISACODYL 5 MG PO TBEC
10.0000 mg | DELAYED_RELEASE_TABLET | Freq: Every day | ORAL | Status: DC
  Administered 2022-04-08 – 2022-04-18 (×11): 10 mg via ORAL
  Filled 2022-04-08 (×12): qty 2

## 2022-04-08 MED ORDER — FLUOXETINE HCL 10 MG PO CAPS
10.0000 mg | ORAL_CAPSULE | Freq: Every day | ORAL | Status: DC
  Administered 2022-04-09 – 2022-04-19 (×10): 10 mg via ORAL
  Filled 2022-04-08 (×11): qty 1

## 2022-04-08 MED ORDER — VANCOMYCIN HCL 1500 MG/300ML IV SOLN
1500.0000 mg | Freq: Once | INTRAVENOUS | Status: AC
  Administered 2022-04-08: 1500 mg via INTRAVENOUS
  Filled 2022-04-08: qty 300

## 2022-04-08 MED ORDER — ACETAMINOPHEN 325 MG PO TABS: 650.0000 mg | ORAL_TABLET | Freq: Four times a day (QID) | ORAL | Status: DC | PRN

## 2022-04-08 MED ORDER — SODIUM CHLORIDE 0.9 % IV SOLN
2.0000 g | Freq: Once | INTRAVENOUS | Status: AC
  Administered 2022-04-08: 2 g via INTRAVENOUS
  Filled 2022-04-08: qty 20

## 2022-04-08 MED ORDER — ACETAMINOPHEN 650 MG RE SUPP: 650.0000 mg | Freq: Four times a day (QID) | RECTAL | Status: DC | PRN

## 2022-04-08 MED ORDER — CLONAZEPAM 0.5 MG PO TABS
0.5000 mg | ORAL_TABLET | Freq: Two times a day (BID) | ORAL | Status: DC | PRN
  Administered 2022-04-09 – 2022-04-18 (×8): 0.5 mg via ORAL
  Filled 2022-04-08 (×8): qty 1

## 2022-04-08 MED ORDER — LORATADINE 10 MG PO TABS: 10.0000 mg | ORAL_TABLET | Freq: Every day | ORAL | Status: DC | PRN

## 2022-04-08 MED ORDER — GABAPENTIN 800 MG PO TABS
800.0000 mg | ORAL_TABLET | Freq: Three times a day (TID) | ORAL | Status: DC
  Filled 2022-04-08: qty 1

## 2022-04-08 MED ORDER — ONDANSETRON HCL 4 MG/2ML IJ SOLN
4.0000 mg | Freq: Once | INTRAMUSCULAR | Status: AC
Start: 2022-04-08 — End: 2022-04-08
  Administered 2022-04-08: 4 mg via INTRAVENOUS
  Filled 2022-04-08: qty 2

## 2022-04-08 MED ORDER — CEFEPIME HCL 2 G IV SOLR
2.0000 g | Freq: Three times a day (TID) | INTRAVENOUS | Status: DC
  Administered 2022-04-08 – 2022-04-09 (×3): 2 g via INTRAVENOUS
  Filled 2022-04-08 (×3): qty 12.5

## 2022-04-08 MED ORDER — LAMOTRIGINE 25 MG PO TABS
25.0000 mg | ORAL_TABLET | Freq: Two times a day (BID) | ORAL | Status: DC
  Administered 2022-04-08 – 2022-04-19 (×21): 25 mg via ORAL
  Filled 2022-04-08 (×21): qty 1

## 2022-04-08 MED ORDER — HYDROCODONE-ACETAMINOPHEN 5-325 MG PO TABS: 1.0000 | ORAL_TABLET | ORAL | Status: DC | PRN

## 2022-04-08 MED ORDER — ENOXAPARIN SODIUM 40 MG/0.4ML IJ SOSY
40.0000 mg | PREFILLED_SYRINGE | INTRAMUSCULAR | Status: DC
  Administered 2022-04-08 – 2022-04-12 (×5): 40 mg via SUBCUTANEOUS
  Filled 2022-04-08 (×5): qty 0.4

## 2022-04-08 MED ORDER — GABAPENTIN 400 MG PO CAPS
800.0000 mg | ORAL_CAPSULE | Freq: Three times a day (TID) | ORAL | Status: DC
  Administered 2022-04-08 – 2022-04-19 (×31): 800 mg via ORAL
  Filled 2022-04-08 (×30): qty 2

## 2022-04-08 MED ORDER — DOCUSATE SODIUM 100 MG PO CAPS
100.0000 mg | ORAL_CAPSULE | Freq: Every day | ORAL | Status: DC
  Administered 2022-04-08 – 2022-04-18 (×11): 100 mg via ORAL
  Filled 2022-04-08 (×12): qty 1

## 2022-04-08 MED ORDER — INSULIN ASPART 100 UNIT/ML IJ SOLN
0.0000 [IU] | Freq: Three times a day (TID) | INTRAMUSCULAR | Status: DC
  Administered 2022-04-08: 9 [IU] via SUBCUTANEOUS
  Administered 2022-04-09 (×2): 3 [IU] via SUBCUTANEOUS
  Administered 2022-04-09: 2 [IU] via SUBCUTANEOUS

## 2022-04-08 MED ORDER — METRONIDAZOLE 500 MG/100ML IV SOLN
500.0000 mg | Freq: Two times a day (BID) | INTRAVENOUS | Status: DC
  Administered 2022-04-08 – 2022-04-13 (×10): 500 mg via INTRAVENOUS
  Filled 2022-04-08 (×10): qty 100

## 2022-04-08 MED ORDER — INSULIN ASPART PROT & ASPART (70-30 MIX) 100 UNIT/ML ~~LOC~~ SUSP
30.0000 [IU] | Freq: Every day | SUBCUTANEOUS | Status: DC
  Administered 2022-04-09: 30 [IU] via SUBCUTANEOUS
  Filled 2022-04-08: qty 10

## 2022-04-08 MED ORDER — SODIUM CHLORIDE 0.9 % IV SOLN: INTRAVENOUS | Status: AC

## 2022-04-08 MED ORDER — OXYCODONE HCL 5 MG PO TABS
15.0000 mg | ORAL_TABLET | Freq: Four times a day (QID) | ORAL | Status: DC | PRN
  Administered 2022-04-08 – 2022-04-19 (×21): 15 mg via ORAL
  Filled 2022-04-08 (×24): qty 3

## 2022-04-08 MED ORDER — LEVOTHYROXINE SODIUM 75 MCG PO TABS
75.0000 ug | ORAL_TABLET | Freq: Every day | ORAL | Status: DC
Start: 2022-04-09 — End: 2022-04-19
  Administered 2022-04-09 – 2022-04-19 (×11): 75 ug via ORAL
  Filled 2022-04-08 (×12): qty 1

## 2022-04-08 NOTE — Telephone Encounter (Signed)
Patient called she went to the wound center yesterday they want her admitted for IV antibiotics and debridement of wound.   They told her to call and get a direct order sent over to the hospital, she said she called the after hour service 2x last night and never received a call back?  Please advise.   She wants order sent to [facility]

## 2022-04-08 NOTE — Assessment & Plan Note (Addendum)
Well controlled Hold lisinopril with aKI

## 2022-04-08 NOTE — H&P (Signed)
History and Physical    Patient: Katherine Herrera JTT:[id number] DOB: 14-Nov-1965 DOA: 04/08/2022 DOS: the patient was seen and examined on 04/08/2022 PCP: Cassandria Anger, MD  Patient coming from: Home    Chief Complaint: right foot infection   HPI: Katherine Herrera is a 56 y.o. female with medical history significant of T2DM, hx of childhood seizures, chronic pain syndrome, HLD, hypothyroidism, chronic osteo of right foot who was told to come to ER for IV antibiotics for her foot wound. She was seen by wound care who advised her to come and be seen as they were concerned for acute infection. She states she has had a chronic wound of the right foot for years and follows with podiatry. She states over the past 2 days she has had increased redness and swelling to her right lower leg and increased pain. Her wound has started to have a foul smell coming from it as well.    She has been feeling good. Denies any fever/chills, vision changes/headaches, chest pain or palpitations, shortness of breath or cough, abdominal pain, N/V/D, dysuria or leg swelling.    She does smoke, does not drink alcohol.   ER Course:  vitals: afebrile, bp: 127/66, HR: 87, RR: 16, oxygen: 94% RA Pertinent labs: glucose: 402, creatinine: 1.24, BC/lactic acid pending Right foot xray: Similar degree of chronic osteomyelitis involving the base of the fifth metatarsal and cuboid. No progressive bony destruction. In ED: started on vancomycin. Lactic acid and blood culture pending    Review of Systems: As mentioned in the history of present illness. All other systems reviewed and are negative. Past Medical History:  Diagnosis Date   Anxiety    Arthritis    hands   B12 deficiency    Chronic constipation    Chronic pain syndrome    neck, lower back, and foot   COPD (chronic obstructive pulmonary disease) ([place])    Diabetic ulcer of right foot ([place])    Gout    possible   History of seizure    05-26-2020  per pt as  child and last one in late teen's   History of sepsis    due to lower extremity cellulits 2019 left , right 03/ 2021   History of syncope    Hyperlipidemia    Hypothyroidism    Impaired range of motion of cervical spine    per pt hx cervical fusion C4 -- 7,  has to be careful about turning her head to the right can cause her to pass out   Insulin dependent type 2 diabetes mellitus [facility])    endocrinologist--- dr Chalmers Cater    (05-26-2020 per pt check's blood sugar twice dialy and at times a third time,  fasting am blood sugar's---- 200 -- 250)   LBP (low back pain)    DR Patrice Paradise   Migraine    Peripheral neuropathy    RBBB (right bundle branch block)    Tenosynovitis of foot and ankle 09/03/2013   TTS (tarsal tunnel syndrome) 2010   Left Foot [facility] in [place]   Vitamin D deficiency    Wears glasses    Past Surgical History:  Procedure Laterality Date   ANTERIOR CERVICAL DECOMP/DISCECTOMY FUSION  02-12-2004;  07-06-2006   C4--5 in 2005;  C5 --7  in  0092   Brenda Right 07/05/2019   Procedure: Application Of Wound Vac;  Surgeon: Evelina Bucy, DPM;  Location: [place];  Service: Podiatry;  Laterality: Right;  BONE BIOPSY Right 05/29/2020   Procedure: BONE BIOPSY;  Surgeon: Evelina Bucy, DPM;  Location: [place];  Service: Podiatry;  Laterality: Right;   CARDIAC CATHETERIZATION  05-27-2002   dr Einar Gip   for positive cardiolite;  normal coronaries and LVF, ef 70%   CARPAL TUNNEL RELEASE Bilateral left 12-25-2008;  right 09-22-2009  both '@[facility]'    HARDWARE REMOVAL  01-07-2016  '@[facility]'    right foot   INCISION AND DRAINAGE OF WOUND Right 07/20/2019   Procedure: IRRIGATION AND DEBRIDEMENT foot;  Surgeon: Evelina Bucy, DPM;  Location: [facility];  Service: Podiatry;  Laterality: Right;   INCISION AND DRAINAGE OF WOUND Right 07/22/2019   Procedure: IRRIGATION AND DEBRIDEMENT RIGHT FOOT; APPLICATION OF WOUND VAC;  Surgeon: Evelina Bucy, DPM;  Location: [facility];  Service:  Podiatry;  Laterality: Right;   INCISION AND DRAINAGE OF WOUND Right 07/24/2019   Procedure: IRRIGATION AND DEBRIDEMENT WOUND, APPLICATION OF WOUND VAC;  Surgeon: Evelina Bucy, DPM;  Location[facility];  Service: Podiatry;  Laterality: Right;   INCISION AND DRAINAGE OF WOUND Right 05/29/2020   Procedure: RIGHT FOOT WOUND DEBRIDEMENT AND IRRIGATION;  Surgeon: Evelina Bucy, DPM;  Location: [place];  Service: Podiatry;  Laterality: Right;   LUMBAR DISC SURGERY  07-07-2005   '[facility]    L5 - S1   METATARSAL HEAD EXCISION Bilateral 01/02/2019   Procedure: Left foot 5th metatarsal resection, wound excision, adjacent tissue transfer. Right fifth metatarsal head resection, fitfth metatarsal base resection, intermediate wound repair.;  Surgeon: Evelina Bucy, DPM;  Location: [place];  Service: Podiatry;  Laterality: Bilateral;   METATARSAL HEAD EXCISION Right 05/12/2021   Procedure: Right 5th met base resection;  Surgeon: Evelina Bucy, DPM;  Location[facility];  Service: Podiatry;  Laterality: Right;   METATARSAL OSTEOTOMY Right 07/05/2019   Procedure: RIGHT FOOT 5TH METATARSAL RESECTION; PERONEAL TENDONESIS, EXCISION OF ULCER, LOCAL TISSUE TRANSFER ;  Surgeon: Evelina Bucy, DPM;  Location: [place];  Service: Podiatry;  Laterality: Right;   ORIF METATARSAL FRACTURE  09-22-2015  '@[facility]'    right 5th   TOTAL ABDOMINAL HYSTERECTOMY W/ BILATERAL SALPINGOOPHORECTOMY  2000 approx.   Social History:  reports that she has been smoking cigarettes. She has a 10.00 pack-year smoking history. She has never used smokeless tobacco. She reports that she does not drink alcohol and does not use drugs.  Allergies  Allergen Reactions   Cephalexin Nausea And Vomiting   Demerol  [Meperidine Hcl] Rash   Lovastatin Other (See Comments)    Possible myalgia    Metformin And Related Nausea And Vomiting   Sulfamethoxazole-Trimethoprim     Other reaction(s): Unknown DILI, pancreatitis    Crestor [Rosuvastatin Calcium]     myalgia   Hydromorphone     Other reaction(s): Unknown   Niacin Other (See Comments)    Unknown    Other Other (See Comments)   Hydromorphone Hcl Itching    Patient has been tolerating Hydromorphone tablets without adverse effect (07/09/19) Other reaction(s): Unknown   Paroxetine Other (See Comments)    Unknown     Family History  Problem Relation Age of Onset   Diabetes Mother    Hypertension Mother    Cancer Father 75       ? bone cancer   [name] Brother     Prior to Admission medications   Medication Sig Start Date End Date Taking? Authorizing Provider  B-D INS SYR ULTRAFINE 1CC/31G 31G  X 5/16" 1 ML MISC USE AS DIRECTED 09/08/16   Renato Shin, MD  baclofen (LIORESAL) 10 MG tablet Take 2 tablets (20 mg total) by mouth 2 (two) times daily. 01/13/22   Plotnikov, [name], MD  bisacodyl (DULCOLAX) 5 MG EC tablet Take 10 mg by mouth at bedtime.    [provider]  Blood Glucose Monitoring Suppl (ONETOUCH VERIO) w/Device KIT  11/28/18   [provider]  cholecalciferol (VITAMIN D3) 25 MCG (1000 UNIT) tablet Take 1,000 Units by mouth at bedtime.    [provider]  clonazePAM (KLONOPIN) 0.5 MG tablet Take 1 tablet (0.5 mg total) by mouth 2 (two) times daily as needed for anxiety. 02/23/22   Plotnikov, [name], MD  colchicine 0.6 MG tablet TAKE 2 TABLETS AS NEEDED GOUT ATTACK. THEN TAKE ANOTHER 1 TAB IN 1-2 HOURS. DONT REPEAT FOR 3 DAYS. 03/02/22   Plotnikov, [name], MD  docusate sodium (COLACE) 100 MG capsule Take 100 mg by mouth daily.    [provider]  ezetimibe (ZETIA) 10 MG tablet TAKE 1 TABLET BY MOUTH  DAILY 09/06/21   Plotnikov, [name], MD  FLUoxetine (PROZAC) 10 MG capsule Take 1 capsule (10 mg total) by mouth daily. 02/23/22   Plotnikov, [name], MD  gabapentin (NEURONTIN) 800 MG tablet TAKE 1 TABLET BY MOUTH 4  TIMES DAILY 02/25/22   Plotnikov, [name], MD  glucose blood (ONETOUCH VERIO)  test strip Use to check blood sugar 2 times per day. 06/24/16   Renato Shin, MD  insulin NPH-regular Human (70-30) 100 UNIT/ML injection Inject 20 Units into the skin 2 (two) times daily with a meal. 11/29/21   Geradine Girt, DO  Insulin Syringe-Needle U-100 (SURE COMFORT INSULIN SYRINGE) 31G X 5/16" 0.5 ML MISC USE AS DIRECTED WITH INSULIN 4 TIMES A DAY 09/08/17   Renato Shin, MD  lamoTRIgine (LAMICTAL) 25 MG tablet TAKE 1 TABLET BY MOUTH  TWICE DAILY 07/20/21   Plotnikov, [name], MD  Lancet Devices (ACCU-CHEK [facility]) lancets Test two times daily 07/01/16   Renato Shin, MD  Lancets (ONETOUCH DELICA PLUS [id number]) [place]  11/07/19   [provider]  levothyroxine (SYNTHROID) 75 MCG tablet TAKE 1 TABLET BY MOUTH  DAILY 02/25/22   Plotnikov, [name], MD  lisinopril (ZESTRIL) 10 MG tablet Take 10 mg by mouth daily. 12/21/21   [provider]  loratadine (CLARITIN) 10 MG tablet Take 1 tablet (10 mg total) by mouth daily as needed for allergies. 04/21/20   Plotnikov, [name], MD  ondansetron (ZOFRAN) 4 MG tablet Take 4 mg by mouth every 8 (eight) hours as needed for nausea or vomiting.    [provider]  oxyCODONE (ROXICODONE) 15 MG immediate release tablet Take 1 tablet (15 mg total) by mouth every 6 (six) hours as needed. 02/23/22   Plotnikov, [name], MD  rOPINIRole (REQUIP) 0.5 MG tablet TAKE 1 TABLET BY MOUTH AT  BEDTIME 02/25/22   Plotnikov, [name], MD  silver sulfADIAZINE (SILVADENE) 1 % cream APPLY PEA-SIZED AMOUNT TO WOUND DAILY. 12/01/21   Evelina Bucy, DPM  vitamin B-12 (CYANOCOBALAMIN) 1000 MCG tablet Take 1,000 mcg by mouth daily.    [provider]    Physical Exam: Vitals:   04/08/22 1122 04/08/22 1127  BP: 127/66   Pulse: 87   Resp: 16   Temp: 98.6 F (37 C)   TempSrc: Oral   SpO2: 94%   Weight:  98.4 kg  Height:  5'  9" (1.753 m)   General:  Appears calm and comfortable and is in NAD Eyes:  PERRL, EOMI, normal lids, iris ENT:   grossly normal hearing, lips & tongue, mmm; appropriate dentition Neck:  no LAD, masses or thyromegaly; no carotid bruits Cardiovascular:  RRR, no m/r/g. No LE edema.  Respiratory:   CTA bilaterally with no wheezes/rales/rhonchi.  Normal respiratory effort. Abdomen:  soft, NT, ND, NABS Back:   normal alignment, no CVAT Skin:  see below  Musculoskeletal:  grossly normal tone BUE/BLE, good ROM, no bony abnormality Lower extremity:  1+ LE edema in right leg with erythema and warmth to distal part of leg. Large, foul smelling open wound to lateral right foot. No drainage.       2+ distal pulses. Psychiatric:  grossly normal mood and affect, speech fluent and appropriate, AOx3 Neurologic:  CN 2-12 grossly intact, moves all extremities in coordinated fashion, sensation intact   Radiological Exams on Admission: Independently reviewed - see discussion in A/P where applicable  DG Foot Complete Right  Result Date: 04/08/2022 CLINICAL DATA:  Right foot pain. EXAM: RIGHT FOOT COMPLETE - 3+ VIEW COMPARISON:  Right foot x-rays dated Mar 04, 2022. MRI right foot dated Mar 11, 2022. FINDINGS: Similar degree of chronic bony destruction involving the base of fifth metatarsal and cuboid. No progressive bony destruction. 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 No acute fracture or dislocation. Unchanged soft tissue wound along the lateral mid and forefoot. IMPRESSION: 1. Similar degree of chronic osteomyelitis involving the base of the fifth metatarsal and cuboid. No progressive bony destruction. Electronically Signed   By: Titus Dubin M.D.   On: 04/08/2022 14:30    EKG: pending    Labs on Admission: I have personally reviewed the available labs and imaging studies at the time of the admission.  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)   Essential hypertension   Hypothyroidism   Pure hypercholesterolemia   Anxiety disorder   Chronic pain    Assessment and Plan: * Osteomyelitis of foot ([place]) 56 year old female with known chronic osteomyelitis of right 5th metatarsal in setting of T2DM who presented to ED for worsening wound and IV abx from wound care  -admit to med-surg -blood cultures pending, lactic acid 1.1  -no criteria for sepsis -start IV vanc/cefepime/flagyl  -ABIs checked in 12/2021 and wnl  -MRI in 02/2022: Chronic wound at the lateral aspect of the fifth TMT joint with underlying chronic osteomyelitis of the cuboid and fourth and fifth metatarsal bases. No abscess. -podiatry consulted-Dr. Sherryle Lis and will see tomorrow. may need ortho consult as well.   AKI (acute kidney injury) ([place]) Renal function fluctuates. AKI vs. 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 Statin intolerance.  Recent LDL of 135  Anxiety disorder Continue prozac and klonopin   Chronic pain syndrome  Continue home oxycodone Pmp website reviewed and verified.   Advance Care Planning:   Code Status: Full Code   Consults: podiatry: Dr. Sherryle Lis and wound care   DVT Prophylaxis: lovenox   Family Communication: none   Severity of Illness: The appropriate patient status for this patient is INPATIENT. Inpatient status is  judged to be reasonable and necessary in order to provide the required intensity of service to ensure the patient's safety. The patient's presenting symptoms, physical exam findings, and initial radiographic and laboratory data in the context of their chronic comorbidities is felt to place them at high risk for further clinical deterioration. Furthermore, it is not anticipated that the patient will be medically stable for discharge from the hospital within 2 midnights of admission.   * I certify that at the point of admission it is my clinical judgment that the patient will require inpatient hospital care spanning beyond 2 midnights from the point of admission due to high intensity of service, high risk for further deterioration and high frequency of surveillance required.*  Author: Orma Flaming, MD 04/08/2022 6:10 PM  For on call review [email or web address].

## 2022-04-08 NOTE — ED Triage Notes (Signed)
Pt arrived POV from home c/o osteomyelitis in her right foot. Pt states she has been seeing a wound dr for her foot and had an MRI done yesterday and they told her to come get admitted for IV antibiotics.

## 2022-04-08 NOTE — ED Provider Triage Note (Signed)
Emergency Medicine Provider Triage Evaluation Note  Katherine Herrera , a 56 y.o. female  was evaluated in triage.  Pt complains of right foot infection. Podiatry sent her to Wound care. Wound care doctor sent her over for osteomyilitis and IV antibiotics. Her MRI is from early May 2023 that showed chronic osteo. Per patient, the provider reported that is was acute and needed to come tot he ER for admission. Patient reports it has been worsening over the past few days with swelling and erythema.  Review of Systems  Positive:  Negative:   Physical Exam  BP 127/66 (BP Location: Right Arm)   Pulse 87   Temp 98.6 F (37 C) (Oral)   Resp 16   Ht '5\' 9"'$  (1.753 m)   Wt 98.4 kg   SpO2 94%   BMI 32.05 kg/m  Gen:   Awake, no distress   Resp:  Normal effort  MSK:   Moves extremities without difficulty  Other:  Feet in bandages. Patient in no acute distress.   Medical Decision Making  Medically screening exam initiated at 11:55 AM.  Appropriate orders placed.  Katherine Herrera was informed that the remainder of the evaluation will be completed by another provider, this initial triage assessment does not replace that evaluation, and the importance of remaining in the ED until their evaluation is complete.  MRI from early May.    Katherine Puller, PA-C 04/08/22 1157

## 2022-04-08 NOTE — ED Notes (Signed)
Purple man not placed I called 6n and requested

## 2022-04-08 NOTE — ED Notes (Signed)
I received this pt at 1800 labd not drawn ordered prior to her coming over here

## 2022-04-08 NOTE — ED Provider Notes (Cosign Needed Addendum)
[facility] EMERGENCY DEPARTMENT Provider Note   CSN: [id number] Arrival date & time: 04/08/22  1102     History  Chief Complaint  Patient presents with   Foot Pain    Infection     Katherine Herrera is a 56 y.o. female with a past medical history of diabetes who presents to the emergency department complaining of chronic right foot pain and ulcer.  She was evaluated by her podiatrist and May 2023 and had an MRI that noted chronic osteomyelitis to the foot.  Since then she has been evaluated by wound care.  She saw her wound care doctor 2 days ago who sent her to the emergency department for admission due to osteomyelitis and for IV antibiotics and possible PICC line.  No meds tried prior to arrival to the ED.  Denies chest pain, shortness of breath, fever, chills, nausea, vomiting. Her podiatrist is Dr. Posey Pronto.   The history is provided by the patient. No language interpreter was used.       Home Medications Prior to Admission medications   Medication Sig Start Date End Date Taking? Authorizing Provider  B-D INS SYR ULTRAFINE 1CC/31G 31G X 5/16" 1 ML MISC USE AS DIRECTED 09/08/16   Renato Shin, MD  baclofen (LIORESAL) 10 MG tablet Take 2 tablets (20 mg total) by mouth 2 (two) times daily. 01/13/22   Plotnikov, [name], MD  bisacodyl (DULCOLAX) 5 MG EC tablet Take 10 mg by mouth at bedtime.    [provider]  Blood Glucose Monitoring Suppl (ONETOUCH VERIO) w/Device KIT  11/28/18   [provider]  cholecalciferol (VITAMIN D3) 25 MCG (1000 UNIT) tablet Take 1,000 Units by mouth at bedtime.    [provider]  clonazePAM (KLONOPIN) 0.5 MG tablet Take 1 tablet (0.5 mg total) by mouth 2 (two) times daily as needed for anxiety. 02/23/22   Plotnikov, [name], MD  colchicine 0.6 MG tablet TAKE 2 TABLETS AS NEEDED GOUT ATTACK. THEN TAKE ANOTHER 1 TAB IN 1-2 HOURS. DONT REPEAT FOR 3 DAYS. 03/02/22   Plotnikov, [name], MD  docusate sodium (COLACE)  100 MG capsule Take 100 mg by mouth daily.    [provider]  ezetimibe (ZETIA) 10 MG tablet TAKE 1 TABLET BY MOUTH  DAILY 09/06/21   Plotnikov, [name], MD  FLUoxetine (PROZAC) 10 MG capsule Take 1 capsule (10 mg total) by mouth daily. 02/23/22   Plotnikov, [name], MD  gabapentin (NEURONTIN) 800 MG tablet TAKE 1 TABLET BY MOUTH 4  TIMES DAILY 02/25/22   Plotnikov, [name], MD  glucose blood (ONETOUCH VERIO) test strip Use to check blood sugar 2 times per day. 06/24/16   Renato Shin, MD  insulin NPH-regular Human (70-30) 100 UNIT/ML injection Inject 20 Units into the skin 2 (two) times daily with a meal. 11/29/21   Geradine Girt, DO  Insulin Syringe-Needle U-100 (SURE COMFORT INSULIN SYRINGE) 31G X 5/16" 0.5 ML MISC USE AS DIRECTED WITH INSULIN 4 TIMES A DAY 09/08/17   Renato Shin, MD  lamoTRIgine (LAMICTAL) 25 MG tablet TAKE 1 TABLET BY MOUTH  TWICE DAILY 07/20/21   Plotnikov, [name], MD  Lancet Devices (ACCU-CHEK [facility]) lancets Test two times daily 07/01/16   Renato Shin, MD  Lancets (ONETOUCH DELICA PLUS LEXNTZ00F) [place]  11/07/19   [provider]  levothyroxine (SYNTHROID) 75 MCG tablet TAKE 1 TABLET BY MOUTH  DAILY 02/25/22   Plotnikov, [name], MD  lisinopril (ZESTRIL) 10 MG  tablet Take 10 mg by mouth daily. 12/21/21   [provider]  loratadine (CLARITIN) 10 MG tablet Take 1 tablet (10 mg total) by mouth daily as needed for allergies. 04/21/20   Plotnikov, [name], MD  ondansetron (ZOFRAN) 4 MG tablet Take 4 mg by mouth every 8 (eight) hours as needed for nausea or vomiting.    [provider]  oxyCODONE (ROXICODONE) 15 MG immediate release tablet Take 1 tablet (15 mg total) by mouth every 6 (six) hours as needed. 02/23/22   Plotnikov, [name], MD  rOPINIRole (REQUIP) 0.5 MG tablet TAKE 1 TABLET BY MOUTH AT  BEDTIME 02/25/22   Plotnikov, [name], MD  silver sulfADIAZINE (SILVADENE) 1 % cream APPLY PEA-SIZED AMOUNT TO WOUND DAILY. 12/01/21    Evelina Bucy, DPM  vitamin B-12 (CYANOCOBALAMIN) 1000 MCG tablet Take 1,000 mcg by mouth daily.    [provider]      Allergies    Cephalexin, Demerol  [meperidine hcl], Lovastatin, Metformin and related, Sulfamethoxazole-trimethoprim, Crestor [rosuvastatin calcium], Niacin, Other, Hydromorphone hcl, and Paroxetine    Review of Systems   Review of Systems  Constitutional:  Negative for chills and fever.  Respiratory:  Negative for shortness of breath.   Cardiovascular:  Negative for chest pain.  Gastrointestinal:  Negative for nausea and vomiting.  Musculoskeletal:  Positive for arthralgias. Negative for joint swelling.  Skin:  Positive for wound. Negative for rash.  All other systems reviewed and are negative.   Physical Exam Updated Vital Signs BP 127/66 (BP Location: Right Arm)   Pulse 87   Temp 98.6 F (37 C) (Oral)   Resp 16   Ht '5\' 9"'  (1.753 m)   Wt 98.4 kg   SpO2 94%   BMI 32.05 kg/m  Physical Exam Vitals and nursing note reviewed.  Constitutional:      General: She is not in acute distress.    Appearance: She is not diaphoretic.  HENT:     Head: Normocephalic and atraumatic.     Mouth/Throat:     Pharynx: No oropharyngeal exudate.  Eyes:     General: No scleral icterus.    Conjunctiva/sclera: Conjunctivae normal.  Cardiovascular:     Rate and Rhythm: Normal rate and regular rhythm.     Pulses: Normal pulses.     Heart sounds: Normal heart sounds.  Pulmonary:     Effort: Pulmonary effort is normal. No respiratory distress.     Breath sounds: Normal breath sounds. No wheezing.  Abdominal:     General: Bowel sounds are normal.     Palpations: Abdomen is soft. There is no mass.     Tenderness: There is no abdominal tenderness. There is no guarding or rebound.  Musculoskeletal:        General: Normal range of motion.     Cervical back: Normal range of motion and neck supple.     Comments: Able to flex and extend ankle without difficulty.   Sensation intact to right foot.  Large area of skin breakdown noted to the lateral aspect of the right foot with malodorous drainage noted.  No surrounding erythema noted to the area. See picture below.  Skin:    General: Skin is warm and dry.  Neurological:     Mental Status: She is alert.  Psychiatric:        Behavior: Behavior normal.      ED Results / Procedures / Treatments   Labs (all labs ordered are listed, but only abnormal  results are displayed) Labs Reviewed  COMPREHENSIVE METABOLIC PANEL - Abnormal; Notable for the following components:      Result Value   Glucose, Bld 402 (*)    Creatinine, Ser 1.24 (*)    Calcium 8.8 (*)    Albumin 3.2 (*)    AST 12 (*)    GFR, Estimated 51 (*)    All other components within normal limits  CULTURE, BLOOD (ROUTINE X 2)  CULTURE, BLOOD (ROUTINE X 2)  CBC WITH DIFFERENTIAL/PLATELET  LACTIC ACID, PLASMA  LACTIC ACID, PLASMA    EKG None  Radiology DG Foot Complete Right  Result Date: 04/08/2022 CLINICAL DATA:  Right foot pain. EXAM: RIGHT FOOT COMPLETE - 3+ VIEW COMPARISON:  Right foot x-rays dated Mar 04, 2022. MRI right foot dated Mar 11, 2022. FINDINGS: Similar degree of chronic bony destruction involving the base of fifth metatarsal and cuboid. No progressive bony destruction. 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 No acute fracture or dislocation. Unchanged soft tissue wound along the lateral mid and forefoot. IMPRESSION: 1. Similar degree of chronic osteomyelitis involving the base of the fifth metatarsal and cuboid. No progressive bony destruction. Electronically Signed   By: Titus Dubin M.D.   On: 04/08/2022 14:30    Procedures Procedures    Medications Ordered in ED Medications  vancomycin (VANCOREADY) IVPB 1500 mg/300 mL (has no administration in time range)  vancomycin (VANCOREADY) IVPB 1250 mg/250 mL (has no administration in time range)  insulin aspart (novoLOG) injection 0-9 Units (has no  administration in time range)  sodium chloride 0.9 % bolus 500 mL (has no administration in time range)  cefTRIAXone (ROCEPHIN) 2 g in sodium chloride 0.9 % 100 mL IVPB (has no administration in time range)  ondansetron (ZOFRAN) injection 4 mg (has no administration in time range)  fentaNYL (SUBLIMAZE) injection 25 mcg (has no administration in time range)    ED Course/ Medical Decision Making/ A&P Clinical Course as of 04/08/22 1652  Fri Apr 08, 2022  1623 Consult with hospitalist, Dr. Rogers Blocker who agrees with admission at this time. [SB]  1630 Spoke with patient regarding her Rocephin allergy, patient notes she is unaware of an allergy to Rocephin.  Will provide Zofran.  Discussed with patient admission plans, patient agreeable at this time.  Patient appears safe for admission at this time. [SB]  1651 Notified by RN that patient would like pain medication, confirmed via RN that patient is able to take fentanyl, which was ordered. [SB]    Clinical Course User Index [SB] Rodrigues Urbanek A, PA-C                           Medical Decision Making Risk Prescription drug management. Decision regarding hospitalization.   Pt presents with chronic right foot pain.  Patient was informed by her wound care doctor that she should come into the emergency department to be admitted to the hospital due to osteomyelitis for IV antibiotics.  Vital signs stable, patient afebrile, not tachycardic or hypoxic.  On exam patient with Able to flex and extend ankle without difficulty.  Sensation intact to right foot.  Large area of skin breakdown noted to the lateral aspect of the right foot with malodorous drainage noted.  No acute cardiovascular or respiratory exam findings. Differential diagnosis includes osteomyelitis, cellulitis, abscess.    Co morbidities that complicate the patient evaluation: Diabetes  Additional history obtained:  External records from outside source obtained and reviewed  including: MRI  right foot on 03/11/2022 notable for chronic wound to the lateral aspect of the fifth TMT joint with underlying chronic osteomyelitis of the cuboid and fourth and fifth metatarsal bases.  No abscess noted.  Patient was evaluated by her podiatrist, Dr. Posey Pronto on 03/25/2022 who at that time referred the patient to wound care.  Labs:  I ordered, and personally interpreted labs.  The pertinent results include:   CBC unremarkable. CMP with elevated glucose of 402, creatinine slightly elevated 1.24, GFR slightly decreased at 51 otherwise unremarkable. Blood cultures ordered with results pending at time of admission. Lactic ordered with results pending at time of admission.  Imaging: I ordered imaging studies including right foot x-ray I independently visualized and interpreted imaging which showed:  Similar degree of chronic osteomyelitis involving the base of the  fifth metatarsal and cuboid. No progressive bony destruction.   I agree with the radiologist interpretation  Medications:  I ordered medication including vancomycin, Rocephin for antibiotic treatment I have reviewed the patients home medicines and have made adjustments as needed   Consultations: I requested consultation with the Hospitalist, Dr. Rogers Blocker and discussed lab and imaging findings as well as pertinent plan - they recommend: admission at this time  Disposition: Presentation suspicious for chronic osteomyelitis due to diabetic foot ulcer.  Doubt cellulitis or abscess at this time.  After consideration of the diagnostic results and the patients response to treatment, I feel that the patient would benefit from Admission to the hospital.  Discussed admission plans with patient at bedside.  Patient agreeable this time.  Patient received for admission.  This chart was dictated using voice recognition software, Dragon. Despite the best efforts of this provider to proofread and correct errors, errors may still occur which can change  documentation meaning.  Final Clinical Impression(s) / ED Diagnoses Final diagnoses:  Osteomyelitis of right foot, unspecified type [facility])    Rx / DC Orders ED Discharge Orders     None         Rayann Jolley A, PA-C 04/08/22 1636    Jaylise Peek A, PA-C 04/08/22 1652    Lacretia Leigh, MD 04/12/22 1616

## 2022-04-08 NOTE — Telephone Encounter (Signed)
Patient called,said that Dr March Rummage use to call ahead for her so that she didn't have to wait so long.  Explained per Dr Posey Pronto that he could not send a direct order, no longer doing this because hospital is so busy. She verbalized understanding and said ok. She said that she did go to ER last night but left after waiting. She is going back today.

## 2022-04-08 NOTE — Assessment & Plan Note (Addendum)
Renal function fluctuates. AKI vs. Baseline CKD Bolus in ED and patient placed on IV fluids.   -Renal function improved. Strict I/O Hold nephrotoxic drugs Continue to hold baclofen and lisinopril.  -IV fluids.

## 2022-04-08 NOTE — Assessment & Plan Note (Signed)
No signs of exacerbation  

## 2022-04-08 NOTE — Assessment & Plan Note (Addendum)
Continue zetia. Statin intolerance.  Recent LDL of 135

## 2022-04-08 NOTE — Assessment & Plan Note (Signed)
Continue home oxycodone.  pmp website reviewed and fills correctly

## 2022-04-08 NOTE — Assessment & Plan Note (Addendum)
Continue prozac and klonopin

## 2022-04-08 NOTE — Assessment & Plan Note (Addendum)
A1C in 10/2021 was 7.7 -Repeat hemoglobin A1c 7.5 (04/10/2022). -Patient noted to be hypoglycemic on admission likely secondary to acute infection, no signs of DKA. Osteomyelitis likely driving sugar uP. -Patient on 70/30 which has been discontinued and patient initially placed on long-acting insulin of Semglee 20 units in addition to SSI.  -Once no further procedures anticipated we will place back on home regimen 70/30 tomorrow. -CBG noted to be 464 last night and patient received SSI. -Increase Semglee to 35 units daily. -Increase meal coverage NovoLog to 9 units 3 times daily. -SSI. -Diabetes coordinator following.

## 2022-04-08 NOTE — Assessment & Plan Note (Addendum)
56 year old female with known chronic osteomyelitis of right 5th metatarsal in setting of T2DM who presented to ED for worsening wound and IV abx from wound care  -blood cultures negative x5 days., lactic acid 1.1  -no criteria for sepsis -Patient started empirically on IV vancomycin, IV cefepime, IV Flagyl.   -ABIs checked in 12/2021 and wnl  -MRI in 02/2022: Chronic wound at the lateral aspect of the fifth TMT joint with underlying chronic osteomyelitis of the cuboid and fourth and fifth metatarsal bases. No abscess. -podiatry consulted-Dr. Sherryle Lis assessed the patient and patient went to the OR had cultures of biopsies done in addition to irrigation and debridement as well as implantation of antibiotic beads and wound VAC  04/10/2022.   -IV antibiotics were initially held 04/09/2022 until biopsies and cultures were obtained which was done.  -Cultures showing Staphylococcus simulans. -Biopsy with no malignancy identified, gangrenous necrosis of skin and subcutis with focal acute inflammation and surface bacterial overgrowth. -Patient with staged procedure, per podiatry and return to the OR, 04/13/2022 for soft tissue reconstruction. -Resumed IV cefepime and IV vancomycin postoperatively.   -Continue IV Flagyl.   -ID consulted and IV cefepime initially narrowed to IV Rocephin and patient maintained on IV Flagyl and IV vancomycin. -Antibiotics transition to oral Augmentin per ID who are recommending 3-week duration from last visit to the OR with end of treatment 05/03/2022.   -Outpatient follow-up has been set up with ID, Dr. Candiss Norse on 05/16/2022.   -Patient nonweightbearing to the right lower extremity.   -Patient tolerating oral Augmentin.   -We will need SNF placement.   -Wound VAC changes every Monday Wednesday Friday per podiatry. -ID and podiatry following and I appreciate the input and recommendations.

## 2022-04-08 NOTE — Telephone Encounter (Signed)
Answering service called at 955am  stated that they did not have a on call provider for the office and they could not reach [place]. ? They received 2 calls from patient daughter last night about having her direct admitted to the hospital for an acute infection.

## 2022-04-08 NOTE — Progress Notes (Addendum)
Pharmacy Antibiotic Note  Katherine Herrera is a 56 y.o. female admitted on 04/08/2022 presenting with R-foot infection, outpt MRI showing osteo.  Pharmacy has been consulted for vancomycin dosing.  Plan: Vancomycin 1500 mg IV x 1, then 1250 mg IV q 24h (eAUC 525, SCr 1.24) Monitor renal function, Cx, clinical progression and intervention plans Vancomycin levels as needed  Addendum: To add cefepime 2g IV q 8h  Height: '5\' 9"'$  (175.3 cm) Weight: 98.4 kg (217 lb) IBW/kg (Calculated) : 66.2  Temp (24hrs), Avg:98.3 F (36.8 C), Min:98 F (36.7 C), Max:98.6 F (37 C)  Recent Labs  Lab 04/08/22 1220  WBC 9.4  CREATININE 1.24*    Estimated Creatinine Clearance: 63.3 mL/min (A) (by C-G formula based on SCr of 1.24 mg/dL (H)).    Allergies  Allergen Reactions   Cephalexin Nausea And Vomiting   Demerol  [Meperidine Hcl] Rash   Lovastatin Other (See Comments)    Possible myalgia    Metformin And Related Nausea And Vomiting   Sulfamethoxazole-Trimethoprim     Other reaction(s): Unknown DILI, pancreatitis   Crestor [Rosuvastatin Calcium]     myalgia   Niacin Other (See Comments)    Unknown    Other Other (See Comments)   Hydromorphone Hcl Itching    Patient has been tolerating Hydromorphone tablets without adverse effect (07/09/19) Other reaction(s): Unknown   Paroxetine Other (See Comments)    Unknown     Bertis Ruddy, PharmD Clinical Pharmacist ED Pharmacist Phone # [phone] 04/08/2022 4:16 PM

## 2022-04-08 NOTE — Assessment & Plan Note (Signed)
TSH wnl in 08/2021 Continue home synthroid

## 2022-04-09 DIAGNOSIS — N179 Acute kidney failure, unspecified: Secondary | ICD-10-CM

## 2022-04-09 DIAGNOSIS — I10 Essential (primary) hypertension: Secondary | ICD-10-CM

## 2022-04-09 DIAGNOSIS — M869 Osteomyelitis, unspecified: Secondary | ICD-10-CM | POA: Diagnosis not present

## 2022-04-09 DIAGNOSIS — F419 Anxiety disorder, unspecified: Secondary | ICD-10-CM | POA: Diagnosis not present

## 2022-04-09 DIAGNOSIS — E1140 Type 2 diabetes mellitus with diabetic neuropathy, unspecified: Secondary | ICD-10-CM

## 2022-04-09 DIAGNOSIS — M86671 Other chronic osteomyelitis, right ankle and foot: Secondary | ICD-10-CM | POA: Diagnosis not present

## 2022-04-09 DIAGNOSIS — E7800 Pure hypercholesterolemia, unspecified: Secondary | ICD-10-CM

## 2022-04-09 DIAGNOSIS — E039 Hypothyroidism, unspecified: Secondary | ICD-10-CM

## 2022-04-09 DIAGNOSIS — G894 Chronic pain syndrome: Secondary | ICD-10-CM

## 2022-04-09 DIAGNOSIS — J449 Chronic obstructive pulmonary disease, unspecified: Secondary | ICD-10-CM

## 2022-04-09 LAB — BASIC METABOLIC PANEL
Anion gap: 8 (ref 5–15)
BUN: 18 mg/dL (ref 6–20)
CO2: 29 mmol/L (ref 22–32)
Calcium: 8.4 mg/dL — ABNORMAL LOW (ref 8.9–10.3)
Chloride: 100 mmol/L (ref 98–111)
Creatinine, Ser: 1.08 mg/dL — ABNORMAL HIGH (ref 0.44–1.00)
GFR, Estimated: >60 mL/min (ref >60)
Glucose, Bld: 173 mg/dL — ABNORMAL HIGH (ref 70–99)
Potassium: 4.7 mmol/L (ref 3.5–5.1)
Sodium: 137 mmol/L (ref 135–145)

## 2022-04-09 LAB — CBC
HCT: 38.5 % (ref 36.0–46.0)
Hemoglobin: 12.4 g/dL (ref 12.0–15.0)
MCH: 28.6 pg (ref 26.0–34.0)
MCHC: 32.2 g/dL (ref 30.0–36.0)
MCV: 88.7 fL (ref 80.0–100.0)
Platelets: 250 10*3/uL (ref 150–400)
RBC: 4.34 MIL/uL (ref 3.87–5.11)
RDW: 13.8 % (ref 11.5–15.5)
WBC: 7 10*3/uL (ref 4.0–10.5)
nRBC: 0 % (ref 0.0–0.2)

## 2022-04-09 LAB — GLUCOSE, CAPILLARY
Glucose-Capillary: 225 mg/dL — ABNORMAL HIGH (ref 70–99)
Glucose-Capillary: 196 mg/dL — ABNORMAL HIGH (ref 70–99)
Glucose-Capillary: 240 mg/dL — ABNORMAL HIGH (ref 70–99)
Glucose-Capillary: 128 mg/dL — ABNORMAL HIGH (ref 70–99)

## 2022-04-09 LAB — HIV ANTIBODY (ROUTINE TESTING W REFLEX): HIV Screen 4th Generation wRfx: NONREACTIVE

## 2022-04-09 MED ORDER — INSULIN GLARGINE-YFGN 100 UNIT/ML ~~LOC~~ SOLN
20.0000 [IU] | Freq: Every day | SUBCUTANEOUS | Status: DC
  Administered 2022-04-10: 20 [IU] via SUBCUTANEOUS
  Filled 2022-04-09 (×2): qty 0.2

## 2022-04-09 MED ORDER — MUPIROCIN 2 % EX OINT
1.0000 "application " | TOPICAL_OINTMENT | Freq: Two times a day (BID) | CUTANEOUS | Status: AC
  Administered 2022-04-09 – 2022-04-14 (×8): 1 via NASAL
  Filled 2022-04-09 (×3): qty 22

## 2022-04-09 NOTE — Hospital Course (Signed)
Patient 56 year old female with history of type 2 diabetes, history of childhood seizures, chronic pain syndrome, hyperlipidemia, hypothyroidism, chronic osteomyelitis of the right foot who was sent to the ED from wound care center for IV antibiotics due to worsening right foot wound, concern for worsening osteomyelitis.  Patient initially placed on IV antibiotics.  Admitted.  Podiatry consulted.

## 2022-04-09 NOTE — Progress Notes (Signed)
PROGRESS NOTE    ELLIANAH Herrera  QIH:[id number] DOB: 02-25-1966 DOA: 04/08/2022 PCP: Cassandria Anger, MD    Chief Complaint  Patient presents with   Foot Pain    Infection     Brief Narrative:  Patient 56 year old female with history of type 2 diabetes, history of childhood seizures, chronic pain syndrome, hyperlipidemia, hypothyroidism, chronic osteomyelitis of the right foot who was sent to the ED from wound care center for IV antibiotics due to worsening right foot wound, concern for worsening osteomyelitis.  Patient initially placed on IV antibiotics.  Admitted.  Podiatry consulted.    Assessment & Plan:  Principal Problem:   Osteomyelitis of foot ([place]) Active Problems:   AKI (acute kidney injury) ([place])   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)   Essential hypertension   Hypothyroidism   Pure hypercholesterolemia   Anxiety disorder   Chronic pain    Assessment and Plan: * Osteomyelitis of foot ([place]) 56 year old female with known chronic osteomyelitis of right 5th metatarsal in setting of T2DM who presented to ED for worsening wound and IV abx from wound care  -blood cultures pending, lactic acid 1.1  -no criteria for sepsis -Patient started empirically on IV vancomycin, IV cefepime, IV Flagyl.   -ABIs checked in 12/2021 and wnl  -MRI in 02/2022: Chronic wound at the lateral aspect of the fifth TMT joint with underlying chronic osteomyelitis of the cuboid and fourth and fifth metatarsal bases. No abscess. -podiatry consulted-Dr. Sherryle Lis assessed the patient and patient likely to the OR for biopsy and cultures tomorrow 04/10/2022.   -We will discontinue IV antibiotics pending biopsy and culture results.   -We will likely benefit from ID consultation early next week for antibiotic duration and recommendations -Per podiatry.    AKI (acute kidney injury) ([place]) Renal function fluctuates. AKI vs. Baseline CKD Bolus in ED and patient placed on IV fluids.    -Renal function improved. Strict I/O Hold nephrotoxic drugs Continue to hold baclofen and lisinopril.  UA pending   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) A1C in 10/2021 was 7.7 -Repeat hemoglobin A1c. -Patient noted to be hypoglycemic on admission likely secondary to acute infection, no signs of DKA. Osteomyelitis likely driving sugar uP. -Patient on 70/30 which we will continue for today and discontinue and placed on long-acting insulin tomorrow as patient going to be n.p.o. after midnight in anticipation of surgical procedure. -SSI.  Essential hypertension -Blood pressure currently stable.   -Lisinopril on hold secondary to AKI.   -Follow.  Hypothyroidism TSH wnl in 08/2021 Continue home synthroid   Pure hypercholesterolemia Continue zetia. Statin intolerance.  Recent LDL of 135   Anxiety disorder Continue prozac and klonopin   Chronic pain Continue home oxycodone.  pmp website reviewed and fills correctly   COPD (chronic obstructive pulmonary disease) (HCC)-resolved as of 04/08/2022 No signs of exacerbation          DVT prophylaxis: Lovenox Code Status: Full Family Communication: Updated patient.  No family at bedside. Disposition: To be determined.  Likely home when cleared by podiatry.  Status is: Inpatient Remains inpatient appropriate because: Severity of illness.   Consultants:  Podiatry: Dr. Sherryle Lis 04/10/2019  Procedures:  Plain films right foot 04/08/2022  Antimicrobials:  IV cefepime 04/08/2022>>>> 04/09/2022 IV Flagyl 04/08/2022>>>> 04/09/2022 IV vancomycin 04/08/2022>>>> 04/09/2022   Subjective: Patient laying in bed.  No chest pain.  No shortness of breath.  No abdominal pain.  Some complaints of pain in the right  foot.  Awaiting to be assessed by podiatry  Objective: Vitals:   04/08/22 2054 04/08/22 2128 04/09/22 0727 04/09/22 1557  BP: 116/67 121/69 112/77 (!) 118/58  Pulse: 87 79 77 75  Resp: '16 16 16 16  '$ Temp: 98.6 F  (37 C) 98.3 F (36.8 C) (!) 97.5 F (36.4 C) 98.5 F (36.9 C)  TempSrc: Oral Oral Oral Oral  SpO2: 94% 96% 93% 96%  Weight:      Height:        Intake/Output Summary (Last 24 hours) at 04/09/2022 1722 Last data filed at 04/09/2022 0600 Gross per 24 hour  Intake 1883.96 ml  Output --  Net 1883.96 ml   Filed Weights   04/08/22 1127  Weight: 98.4 kg    Examination:  General exam: Appears calm and comfortable  Respiratory system: Clear to auscultation. Respiratory effort normal. Cardiovascular system: S1 & S2 heard, RRR. No JVD, murmurs, rubs, gallops or clicks. No pedal edema. Gastrointestinal system: Abdomen is nondistended, soft and nontender. No organomegaly or masses felt. Normal bowel sounds heard. Central nervous system: Alert and oriented. No focal neurological deficits. Extremities: Right foot bandaged with malodorous pungent odor.  Skin: No rashes, lesions or ulcers Psychiatry: Judgement and insight appear normal. Mood & affect appropriate.          Data Reviewed:   CBC: Recent Labs  Lab 04/08/22 1220 04/09/22 0052  WBC 9.4 7.0  NEUTROABS 7.3  --   HGB 14.2 12.4  HCT 43.7 38.5  MCV 87.9 88.7  PLT 261 979    Basic Metabolic Panel: Recent Labs  Lab 04/08/22 1220 04/09/22 0052  NA 135 137  K 4.7 4.7  CL 99 100  CO2 28 29  GLUCOSE 402* 173*  BUN 18 18  CREATININE 1.24* 1.08*  CALCIUM 8.8* 8.4*    GFR: Estimated Creatinine Clearance: 72.6 mL/min (A) (by C-G formula based on SCr of 1.08 mg/dL (H)).  Liver Function Tests: Recent Labs  Lab 04/08/22 1220  AST 12*  ALT 12  ALKPHOS 96  BILITOT 0.4  PROT 7.2  ALBUMIN 3.2*    CBG: Recent Labs  Lab 04/08/22 1755 04/08/22 2319 04/09/22 0814 04/09/22 1203 04/09/22 1630  GLUCAP 395* 230* 225* 196* 240*     Recent Results (from the past 240 hour(s))  Blood culture (routine x 2)     Status: None (Preliminary result)   Collection Time: 04/08/22 12:20 PM   Specimen: BLOOD  Result  Value Ref Range Status   Specimen Description BLOOD LEFT ANTECUBITAL  Final   Special Requests   Final    BOTTLES DRAWN AEROBIC AND ANAEROBIC Blood Culture results may not be optimal due to an inadequate volume of blood received in culture bottles   Culture   Final    NO GROWTH < 24 HOURS Performed at [facility] [address], [place], [place] [address]    Report Status PENDING  Incomplete  Blood culture (routine x 2)     Status: None (Preliminary result)   Collection Time: 04/08/22  4:30 PM   Specimen: BLOOD LEFT HAND  Result Value Ref Range Status   Specimen Description BLOOD LEFT HAND  Final   Special Requests   Final    BOTTLES DRAWN AEROBIC AND ANAEROBIC Blood Culture adequate volume   Culture   Final    NO GROWTH < 24 HOURS Performed at [facility], [place] [address], [place], [place] [address]    Report Status PENDING  Incomplete         Radiology Studies: DG Foot Complete Right  Result Date: 04/08/2022 CLINICAL DATA:  Right foot pain. EXAM: RIGHT FOOT COMPLETE - 3+ VIEW COMPARISON:  Right foot x-rays dated Mar 04, 2022. MRI right foot dated Mar 11, 2022. FINDINGS: Similar degree of chronic bony destruction involving the base of fifth metatarsal and cuboid. No progressive bony destruction. 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 No acute fracture or dislocation. Unchanged soft tissue wound along the lateral mid and forefoot. IMPRESSION: 1. Similar degree of chronic osteomyelitis involving the base of the fifth metatarsal and cuboid. No progressive bony destruction. Electronically Signed   By: Titus Dubin M.D.   On: 04/08/2022 14:30        Scheduled Meds:  bisacodyl  10 mg Oral QHS   docusate sodium  100 mg Oral QHS   enoxaparin (LOVENOX) injection  40 mg Subcutaneous Q24H   ezetimibe  10 mg Oral Daily   FLUoxetine  10 mg Oral Daily   gabapentin  800 mg Oral TID   insulin aspart  0-9 Units Subcutaneous TID WC   insulin aspart  protamine- aspart  30 Units Subcutaneous Q breakfast   insulin aspart protamine- aspart  40 Units Subcutaneous Q supper   lamoTRIgine  25 mg Oral BID   levothyroxine  75 mcg Oral QAC breakfast   rOPINIRole  0.5 mg Oral Daily   Continuous Infusions:  ceFEPime (MAXIPIME) IV 2 g (04/09/22 1328)   metronidazole 500 mg (04/09/22 0551)   vancomycin       LOS: 1 day    Time spent: 40 minutes.    Irine Seal, MD [facility]   To contact the attending provider between 7A-7P or the covering provider during after hours 7P-7A, please log into the web site [email or web address] and access using universal [place] password for that web site. If you do not have the password, please call the hospital operator.  04/09/2022, 5:22 PM

## 2022-04-09 NOTE — Consult Note (Signed)
Reason for Consult: Osteomyelitis diabetic foot infection and wound Referring Physician: Dr. Clarisse Gouge Katherine Herrera is an 56 y.o. female.  HPI: She is well-known to our practice for recurrent foot ulceration and infections.  The most recent ulceration has been around for several months she has been treated at the wound care center as well as by my partner Dr. Posey Pronto.  The wound acutely worsened recently in the wound care center and recommended she be admitted for IV antibiotics, possible PICC line and debridement.  She had an MRI completed in May  Past Medical History:  Diagnosis Date   Anxiety    Arthritis    hands   B12 deficiency    Chronic constipation    Chronic pain syndrome    neck, lower back, and foot   COPD (chronic obstructive pulmonary disease) ([place])    Diabetic ulcer of right foot ([place])    Gout    possible   History of seizure    05-26-2020  per pt as child and last one in late teen's   History of sepsis    due to lower extremity cellulits 2019 left , right 03/ 2021   History of syncope    Hyperlipidemia    Hypothyroidism    Impaired range of motion of cervical spine    per pt hx cervical fusion C4 -- 7,  has to be careful about turning her head to the right can cause her to pass out   Insulin dependent type 2 diabetes mellitus [facility])    endocrinologist--- dr Chalmers Cater    (05-26-2020 per pt check's blood sugar twice dialy and at times a third time,  fasting am blood sugar's---- 200 -- 250)   LBP (low back pain)    DR Patrice Paradise   Migraine    Peripheral neuropathy    RBBB (right bundle branch block)    Tenosynovitis of foot and ankle 09/03/2013   TTS (tarsal tunnel syndrome) 2010   Left Foot [facility] in [place]   Vitamin D deficiency    Wears glasses     Past Surgical History:  Procedure Laterality Date   ANTERIOR CERVICAL DECOMP/DISCECTOMY FUSION  02-12-2004;  07-06-2006   C4--5 in 2005;  C5 --7  in  6979   [place] Right 07/05/2019   Procedure:  Application Of Wound Vac;  Surgeon: Evelina Bucy, DPM;  Location: [place];  Service: Podiatry;  Laterality: Right;   BONE BIOPSY Right 05/29/2020   Procedure: BONE BIOPSY;  Surgeon: Evelina Bucy, DPM;  Location: [place];  Service: Podiatry;  Laterality: Right;   CARDIAC CATHETERIZATION  05-27-2002   dr Einar Gip   for positive cardiolite;  normal coronaries and LVF, ef 70%   CARPAL TUNNEL RELEASE Bilateral left 12-25-2008;  right 09-22-2009  both _0    HARDWARE REMOVAL  01-07-2016  _1    right foot   INCISION AND DRAINAGE OF WOUND Right 07/20/2019   Procedure: IRRIGATION AND DEBRIDEMENT foot;  Surgeon: Evelina Bucy, DPM;  Location: [facility];  Service: Podiatry;  Laterality: Right;   INCISION AND DRAINAGE OF WOUND Right 07/22/2019   Procedure: IRRIGATION AND DEBRIDEMENT RIGHT FOOT; APPLICATION OF WOUND VAC;  Surgeon: Evelina Bucy, DPM;  Location: [facility];  Service: Podiatry;  Laterality: Right;   INCISION AND DRAINAGE OF WOUND Right 07/24/2019   Procedure: IRRIGATION AND DEBRIDEMENT WOUND, APPLICATION OF WOUND VAC;  Surgeon: Evelina Bucy, DPM;  Location[facility];  Service: Podiatry;  Laterality:  Right;   INCISION AND DRAINAGE OF WOUND Right 05/29/2020   Procedure: RIGHT FOOT WOUND DEBRIDEMENT AND IRRIGATION;  Surgeon: Evelina Bucy, DPM;  Location: [facility];  Service: Podiatry;  Laterality: Right;   LUMBAR DISC SURGERY  07-07-2005   _0    L5 - S1   METATARSAL HEAD EXCISION Bilateral 01/02/2019   Procedure: Left foot 5th metatarsal resection, wound excision, adjacent tissue transfer. Right fifth metatarsal head resection, fitfth metatarsal base resection, intermediate wound repair.;  Surgeon: Evelina Bucy, DPM;  Location: [facility];  Service: Podiatry;  Laterality: Bilateral;   METATARSAL HEAD EXCISION Right 05/12/2021   Procedure: Right 5th met base resection;  Surgeon: Evelina Bucy, DPM;  Location[facility];  Service: Podiatry;   Laterality: Right;   METATARSAL OSTEOTOMY Right 07/05/2019   Procedure: RIGHT FOOT 5TH METATARSAL RESECTION; PERONEAL TENDONESIS, EXCISION OF ULCER, LOCAL TISSUE TRANSFER ;  Surgeon: Evelina Bucy, DPM;  Location: [facility];  Service: Podiatry;  Laterality: Right;   ORIF METATARSAL FRACTURE  09-22-2015  _1    right 5th   TOTAL ABDOMINAL HYSTERECTOMY W/ BILATERAL SALPINGOOPHORECTOMY  2000 approx.    Family History  Problem Relation Age of Onset   Diabetes Mother    Hypertension Mother    Cancer Father 48       ? bone cancer   [name] Brother     Social History:  reports that she has been smoking cigarettes. She has a 10.00 pack-year smoking history. She has never used smokeless tobacco. She reports that she does not drink alcohol and does not use drugs.  Allergies:  Allergies  Allergen Reactions   Cephalexin Nausea And Vomiting   Demerol  [Meperidine Hcl] Rash   Lovastatin Other (See Comments)    Possible myalgia    Metformin And Related Nausea And Vomiting   Sulfamethoxazole-Trimethoprim     Other reaction(s): Unknown DILI, pancreatitis   Crestor [Rosuvastatin Calcium]     myalgia   Hydromorphone     Other reaction(s): Unknown   Niacin Other (See Comments)    Unknown    Other Other (See Comments)   Hydromorphone Hcl Itching    Patient has been tolerating Hydromorphone tablets without adverse effect (07/09/19) Other reaction(s): Unknown   Paroxetine Other (See Comments)    Unknown     Medications: I have reviewed the patient's current medications.  Results for orders placed or performed during the hospital encounter of 04/08/22 (from the past 48 hour(s))  Blood culture (routine x 2)     Status: None (Preliminary result)   Collection Time: 04/08/22 12:20 PM   Specimen: BLOOD  Result Value Ref Range   Specimen Description BLOOD LEFT ANTECUBITAL    Special Requests      BOTTLES DRAWN AEROBIC AND ANAEROBIC Blood Culture results may not be optimal due to an  inadequate volume of blood received in culture bottles   Culture      NO GROWTH < 24 HOURS Performed at [facility] [address], [place], [place] [address]    Report Status PENDING   CBC with Differential     Status: None   Collection Time: 04/08/22 12:20 PM  Result Value Ref Range   WBC 9.4 4.0 - 10.5 K/uL   RBC 4.97 3.87 - 5.11 MIL/uL   Hemoglobin 14.2 12.0 - 15.0 g/dL   HCT 43.7 36.0 - 46.0 %   MCV 87.9 80.0 - 100.0 fL   MCH 28.6 26.0 - 34.0  pg   MCHC 32.5 30.0 - 36.0 g/dL   RDW 13.9 11.5 - 15.5 %   Platelets 261 150 - 400 K/uL   nRBC 0.0 0.0 - 0.2 %   Neutrophils Relative % 77 %   Neutro Abs 7.3 1.7 - 7.7 K/uL   Lymphocytes Relative 15 %   Lymphs Abs 1.4 0.7 - 4.0 K/uL   Monocytes Relative 6 %   Monocytes Absolute 0.6 0.1 - 1.0 K/uL   Eosinophils Relative 1 %   Eosinophils Absolute 0.1 0.0 - 0.5 K/uL   Basophils Relative 1 %   Basophils Absolute 0.1 0.0 - 0.1 K/uL   Immature Granulocytes 0 %   Abs Immature Granulocytes 0.03 0.00 - 0.07 K/uL    Comment: Performed at [facility] [address], [place], [place] [address]  Comprehensive metabolic panel     Status: Abnormal   Collection Time: 04/08/22 12:20 PM  Result Value Ref Range   Sodium 135 135 - 145 mmol/L   Potassium 4.7 3.5 - 5.1 mmol/L   Chloride 99 98 - 111 mmol/L   CO2 28 22 - 32 mmol/L   Glucose, Bld 402 (H) 70 - 99 mg/dL    Comment: Glucose reference range applies only to samples taken after fasting for at least 8 hours.   BUN 18 6 - 20 mg/dL   Creatinine, Ser 1.24 (H) 0.44 - 1.00 mg/dL   Calcium 8.8 (L) 8.9 - 10.3 mg/dL   Total Protein 7.2 6.5 - 8.1 g/dL   Albumin 3.2 (L) 3.5 - 5.0 g/dL   AST 12 (L) 15 - 41 U/L   ALT 12 0 - 44 U/L   Alkaline Phosphatase 96 38 - 126 U/L   Total Bilirubin 0.4 0.3 - 1.2 mg/dL   GFR, Estimated 51 (L) >60 mL/min    Comment: (NOTE) Calculated using the CKD-EPI Creatinine Equation (2021)    Anion gap 8 5 - 15    Comment: Performed at [facility], [place] [address] [place], [place] [address]  Blood culture (routine x 2)     Status: None (Preliminary result)   Collection Time: 04/08/22  4:30 PM   Specimen: BLOOD LEFT HAND  Result Value Ref Range   Specimen Description BLOOD LEFT HAND    Special Requests      BOTTLES DRAWN AEROBIC AND ANAEROBIC Blood Culture adequate volume   Culture      NO GROWTH < 24 HOURS Performed at [facility], [place] [address], [place], [place] [address]    Report Status PENDING   Lactic acid, plasma     Status: None   Collection Time: 04/08/22  4:36 PM  Result Value Ref Range   Lactic Acid, Venous 1.1 0.5 - 1.9 mmol/L    Comment: Performed at [facility] [address], [place], [place] [address]  CBG monitoring, ED     Status: Abnormal   Collection Time: 04/08/22  4:45 PM  Result Value Ref Range   Glucose-Capillary 516 (HH) 70 - 99 mg/dL    Comment: Glucose reference range applies only to samples taken after fasting for at least 8 hours.   Comment 1 Notify RN   CBG monitoring, ED     Status: Abnormal   Collection Time: 04/08/22  5:55 PM  Result Value Ref Range   Glucose-Capillary 395 (H) 70 - 99 mg/dL    Comment: Glucose reference range applies only to samples taken after fasting for at  least 8 hours.  Glucose, capillary     Status: Abnormal   Collection Time: 04/08/22 11:19 PM  Result Value Ref Range   Glucose-Capillary 230 (H) 70 - 99 mg/dL    Comment: Glucose reference range applies only to samples taken after fasting for at least 8 hours.  Basic metabolic panel     Status: Abnormal   Collection Time: 04/09/22 12:52 AM  Result Value Ref Range   Sodium 137 135 - 145 mmol/L   Potassium 4.7 3.5 - 5.1 mmol/L   Chloride 100 98 - 111 mmol/L   CO2 29 22 - 32 mmol/L   Glucose, Bld 173 (H) 70 - 99 mg/dL    Comment: Glucose reference range applies only to samples taken after fasting for at least 8 hours.   BUN 18 6 - 20 mg/dL   Creatinine, Ser 1.08 (H) 0.44 - 1.00 mg/dL    Calcium 8.4 (L) 8.9 - 10.3 mg/dL   GFR, Estimated >60 >60 mL/min    Comment: (NOTE) Calculated using the CKD-EPI Creatinine Equation (2021)    Anion gap 8 5 - 15    Comment: Performed at [facility] [address], [place], [place] [address]  CBC     Status: None   Collection Time: 04/09/22 12:52 AM  Result Value Ref Range   WBC 7.0 4.0 - 10.5 K/uL   RBC 4.34 3.87 - 5.11 MIL/uL   Hemoglobin 12.4 12.0 - 15.0 g/dL   HCT 38.5 36.0 - 46.0 %   MCV 88.7 80.0 - 100.0 fL   MCH 28.6 26.0 - 34.0 pg   MCHC 32.2 30.0 - 36.0 g/dL   RDW 13.8 11.5 - 15.5 %   Platelets 250 150 - 400 K/uL   nRBC 0.0 0.0 - 0.2 %    Comment: Performed at [facility], [place] [address] [place], [place] [address]  HIV Antibody (routine testing w rflx)     Status: None   Collection Time: 04/09/22 12:52 AM  Result Value Ref Range   HIV Screen 4th Generation wRfx Non Reactive Non Reactive    Comment: Performed at [facility], [place] [address], [place], [place] [address]  Glucose, capillary     Status: Abnormal   Collection Time: 04/09/22  8:14 AM  Result Value Ref Range   Glucose-Capillary 225 (H) 70 - 99 mg/dL    Comment: Glucose reference range applies only to samples taken after fasting for at least 8 hours.   Comment 1 Notify RN   Glucose, capillary     Status: Abnormal   Collection Time: 04/09/22 12:03 PM  Result Value Ref Range   Glucose-Capillary 196 (H) 70 - 99 mg/dL    Comment: Glucose reference range applies only to samples taken after fasting for at least 8 hours.   Comment 1 Notify RN   Glucose, capillary     Status: Abnormal   Collection Time: 04/09/22  4:30 PM  Result Value Ref Range   Glucose-Capillary 240 (H) 70 - 99 mg/dL    Comment: Glucose reference range applies only to samples taken after fasting for at least 8 hours.   Comment 1 Notify RN    *Note: Due to a large number of results and/or encounters for the requested time period, some results have not been displayed. A  complete set of results can be found in Results Review.    DG Foot Complete Right  Result Date: 04/08/2022 CLINICAL DATA:  Right foot pain.  EXAM: RIGHT FOOT COMPLETE - 3+ VIEW COMPARISON:  Right foot x-rays dated Mar 04, 2022. MRI right foot dated Mar 11, 2022. FINDINGS: Similar degree of chronic bony destruction involving the base of fifth metatarsal and cuboid. No progressive bony destruction. 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 No acute fracture or dislocation. Unchanged soft tissue wound along the lateral mid and forefoot. IMPRESSION: 1. Similar degree of chronic osteomyelitis involving the base of the fifth metatarsal and cuboid. No progressive bony destruction. Electronically Signed   By: Titus Dubin M.D.   On: 04/08/2022 14:30    Review of Systems  Skin:        Ulcer of right foot   Blood pressure (!) 118/58, pulse 75, temperature 98.5 F (36.9 C), temperature source Oral, resp. rate 16, height _0  (1.753 m), weight 98.4 kg, SpO2 96 %.  Vitals:   04/09/22 0727 04/09/22 1557  BP: 112/77 (!) 118/58  Pulse: 77 75  Resp: 16 16  Temp: (!) 97.5 F (36.4 C) 98.5 F (36.9 C)  SpO2: 93% 96%    General AA&O x3. Normal mood and affect.  Vascular Dorsalis pedis and posterior tibial pulses  present 2+ right  Capillary refill normal to all digits. Pedal hair growth normal.  Neurologic Epicritic sensation grossly absent.  Dermatologic (Wound) Grossly necrotic malodorous circular ulcer over the fifth metatarsal base and lateral foot  Orthopedic: Motor intact BLE.    Assessment/Plan:  Right foot ulceration diabetic foot infection osteomyelitis -Imaging: Studies independently reviewed -Antibiotics: Discussed with Dr. [place] Silos, will hold antibiotics prior to surgery to increase operative yield -WB Status: Will be NWB postop after surgery -Surgical Plan: I reviewed my impression of her MRI with her as well as my clinical impression of the ulceration.  We discussed  that it is a difficult area to have ulceration and osteomyelitis and due to the function of the cuboid and fifth metatarsal base and the function mechanics and position of the foot.  We discussed the risk of reulceration and that these bones cannot be easily and resected.  We discussed staged soft tissue reconstruction with debridement of the osteomyelitis and placement of antibiotic beads and a eventual soft tissue skin substitute with wound VAC.  We also discussed the alternative as below-knee amputation which she is not ready to proceed with.  All questions were addressed.  Surgery be scheduled for tomorrow for debridement, wound VAC placement and biopsy with cultures -Expect she will need a PICC line regardless of the outcome of surgery and ID consult is recommended early next week  Katherine Herrera 04/09/2022, 4:50 PM   Best available via secure chat for questions or concerns.

## 2022-04-09 NOTE — Consult Note (Signed)
WOC Nurse Consult Note: Reason for Consult: Osteomyelitis diabetic foot infection and wound. WOC Nursing is simultaneously consulted with Podiatry, who is familiar with patient. Surgical procedure is scheduled for tomorrow (Sunday) by Dr. Thomasene Lot. Wound type:Infectious Pressure Injury POA: N/A  There is no role for WOC Nursing at this time.   Dr. Sherryle Lis indicates in his consult note that a NPWT device may be placed intraoperatively. Should this be the case, the first surgical dressing change must involve the physician or his designee (PA-C, NP, associate, etc.) for assessment. If NPWT is placed intraoperatively and WOC Nursing assistance is required after first surgical dressing change, please reconsult.   [facility] nursing team will not follow, but will remain available to this patient, the nursing and medical teams.  Please re-consult if needed.  Maudie Flakes, MSN, RN, CNS, [place], Serita Grammes, [employer], [employer] phone:  [phone]

## 2022-04-10 ENCOUNTER — Encounter (HOSPITAL_COMMUNITY)
Admission: EM | Disposition: A | Discharge status: Skilled Nursing Facility | Payer: Self-pay | Source: Home / Self Care | Attending: Internal Medicine

## 2022-04-10 ENCOUNTER — Other Ambulatory Visit: Payer: Self-pay

## 2022-04-10 ENCOUNTER — Inpatient Hospital Stay (HOSPITAL_COMMUNITY): Payer: Medicare Other | Admitting: Anesthesiology

## 2022-04-10 ENCOUNTER — Encounter (HOSPITAL_COMMUNITY): Payer: Self-pay | Admitting: Family Medicine

## 2022-04-10 DIAGNOSIS — E1169 Type 2 diabetes mellitus with other specified complication: Secondary | ICD-10-CM

## 2022-04-10 DIAGNOSIS — L97514 Non-pressure chronic ulcer of other part of right foot with necrosis of bone: Secondary | ICD-10-CM

## 2022-04-10 DIAGNOSIS — M86671 Other chronic osteomyelitis, right ankle and foot: Secondary | ICD-10-CM | POA: Diagnosis not present

## 2022-04-10 DIAGNOSIS — L97519 Non-pressure chronic ulcer of other part of right foot with unspecified severity: Secondary | ICD-10-CM

## 2022-04-10 DIAGNOSIS — M869 Osteomyelitis, unspecified: Secondary | ICD-10-CM

## 2022-04-10 DIAGNOSIS — F419 Anxiety disorder, unspecified: Secondary | ICD-10-CM | POA: Diagnosis not present

## 2022-04-10 DIAGNOSIS — Z794 Long term (current) use of insulin: Secondary | ICD-10-CM

## 2022-04-10 DIAGNOSIS — N179 Acute kidney failure, unspecified: Secondary | ICD-10-CM | POA: Diagnosis not present

## 2022-04-10 DIAGNOSIS — E11621 Type 2 diabetes mellitus with foot ulcer: Secondary | ICD-10-CM

## 2022-04-10 HISTORY — PX: APPLICATION OF WOUND VAC: SHX5189

## 2022-04-10 HISTORY — PX: IRRIGATION AND DEBRIDEMENT FOOT: SHX6602

## 2022-04-10 LAB — CBC WITH DIFFERENTIAL/PLATELET
Abs Immature Granulocytes: 0.02 10*3/uL (ref 0.00–0.07)
Basophils Absolute: 0 10*3/uL (ref 0.0–0.1)
Basophils Relative: 1 %
Eosinophils Absolute: 0.2 10*3/uL (ref 0.0–0.5)
Eosinophils Relative: 3 %
HCT: 39.9 % (ref 36.0–46.0)
Hemoglobin: 12.9 g/dL (ref 12.0–15.0)
Immature Granulocytes: 0 %
Lymphocytes Relative: 34 %
Lymphs Abs: 2.3 10*3/uL (ref 0.7–4.0)
MCH: 28.2 pg (ref 26.0–34.0)
MCHC: 32.3 g/dL (ref 30.0–36.0)
MCV: 87.1 fL (ref 80.0–100.0)
Monocytes Absolute: 0.6 10*3/uL (ref 0.1–1.0)
Monocytes Relative: 9 %
Neutro Abs: 3.6 10*3/uL (ref 1.7–7.7)
Neutrophils Relative %: 53 %
Platelets: 245 10*3/uL (ref 150–400)
RBC: 4.58 MIL/uL (ref 3.87–5.11)
RDW: 13.4 % (ref 11.5–15.5)
WBC: 6.8 10*3/uL (ref 4.0–10.5)
nRBC: 0 % (ref 0.0–0.2)

## 2022-04-10 LAB — HEMOGLOBIN A1C
Hgb A1c MFr Bld: 7.5 % — ABNORMAL HIGH (ref 4.8–5.6)
Mean Plasma Glucose: 168.55 mg/dL

## 2022-04-10 LAB — BASIC METABOLIC PANEL
Anion gap: 7 (ref 5–15)
BUN: 19 mg/dL (ref 6–20)
CO2: 31 mmol/L (ref 22–32)
Calcium: 8.6 mg/dL — ABNORMAL LOW (ref 8.9–10.3)
Chloride: 98 mmol/L (ref 98–111)
Creatinine, Ser: 1.09 mg/dL — ABNORMAL HIGH (ref 0.44–1.00)
GFR, Estimated: 60 mL/min — ABNORMAL LOW (ref >60)
Glucose, Bld: 204 mg/dL — ABNORMAL HIGH (ref 70–99)
Potassium: 4.5 mmol/L (ref 3.5–5.1)
Sodium: 136 mmol/L (ref 135–145)

## 2022-04-10 LAB — GLUCOSE, CAPILLARY
Glucose-Capillary: 236 mg/dL — ABNORMAL HIGH (ref 70–99)
Glucose-Capillary: 211 mg/dL — ABNORMAL HIGH (ref 70–99)
Glucose-Capillary: 271 mg/dL — ABNORMAL HIGH (ref 70–99)
Glucose-Capillary: 316 mg/dL — ABNORMAL HIGH (ref 70–99)

## 2022-04-10 LAB — SURGICAL PCR SCREEN
MRSA, PCR: NEGATIVE
Staphylococcus aureus: NEGATIVE

## 2022-04-10 SURGERY — APPLICATION, WOUND VAC
Anesthesia: Regional | Site: Foot | Laterality: Right

## 2022-04-10 MED ORDER — VANCOMYCIN HCL 2000 MG/400ML IV SOLN
2000.0000 mg | Freq: Once | INTRAVENOUS | Status: AC
  Administered 2022-04-10: 2000 mg via INTRAVENOUS
  Filled 2022-04-10: qty 400

## 2022-04-10 MED ORDER — VANCOMYCIN HCL 1250 MG/250ML IV SOLN
1250.0000 mg | INTRAVENOUS | Status: DC
Start: 2022-04-11 — End: 2022-04-12
  Administered 2022-04-11: 1250 mg via INTRAVENOUS
  Filled 2022-04-10 (×2): qty 250

## 2022-04-10 MED ORDER — FENTANYL CITRATE (PF) 250 MCG/5ML IJ SOLN
INTRAMUSCULAR | Status: DC | PRN
  Administered 2022-04-10 (×2): 50 ug via INTRAVENOUS

## 2022-04-10 MED ORDER — FENTANYL CITRATE (PF) 100 MCG/2ML IJ SOLN
INTRAMUSCULAR | Status: AC
  Administered 2022-04-10: 100 ug via INTRAVENOUS
  Filled 2022-04-10: qty 2

## 2022-04-10 MED ORDER — ONDANSETRON HCL 4 MG/2ML IJ SOLN
INTRAMUSCULAR | Status: DC | PRN
  Administered 2022-04-10: 4 mg via INTRAVENOUS

## 2022-04-10 MED ORDER — SODIUM CHLORIDE 0.9 % IV SOLN
2.0000 g | Freq: Three times a day (TID) | INTRAVENOUS | Status: DC
  Administered 2022-04-10 – 2022-04-11 (×3): 2 g via INTRAVENOUS
  Filled 2022-04-10 (×3): qty 12.5

## 2022-04-10 MED ORDER — MIDAZOLAM HCL 2 MG/2ML IJ SOLN
INTRAMUSCULAR | Status: DC | PRN
  Administered 2022-04-10: 1 mg via INTRAVENOUS

## 2022-04-10 MED ORDER — VANCOMYCIN HCL 500 MG IV SOLR
INTRAVENOUS | Status: DC | PRN
  Administered 2022-04-10: 500 mg

## 2022-04-10 MED ORDER — MIDAZOLAM HCL 2 MG/2ML IJ SOLN: 0.5000 mg | Freq: Once | INTRAMUSCULAR | Status: DC | PRN

## 2022-04-10 MED ORDER — ORAL CARE MOUTH RINSE: 15.0000 mL | Freq: Once | OROMUCOSAL | Status: AC

## 2022-04-10 MED ORDER — FENTANYL CITRATE (PF) 100 MCG/2ML IJ SOLN: 100.0000 ug | Freq: Once | INTRAMUSCULAR | Status: AC

## 2022-04-10 MED ORDER — CHLORHEXIDINE GLUCONATE 0.12 % MT SOLN
OROMUCOSAL | Status: AC
  Administered 2022-04-10: 15 mL via OROMUCOSAL
  Filled 2022-04-10: qty 15

## 2022-04-10 MED ORDER — VANCOMYCIN HCL 1000 MG IV SOLR
INTRAVENOUS | Status: AC
  Filled 2022-04-10: qty 20

## 2022-04-10 MED ORDER — INSULIN ASPART 100 UNIT/ML IJ SOLN
INTRAMUSCULAR | Status: AC
  Administered 2022-04-10: 6 [IU] via SUBCUTANEOUS
  Filled 2022-04-10: qty 1

## 2022-04-10 MED ORDER — FENTANYL CITRATE (PF) 100 MCG/2ML IJ SOLN
INTRAMUSCULAR | Status: AC
  Filled 2022-04-10: qty 2

## 2022-04-10 MED ORDER — INSULIN ASPART 100 UNIT/ML IJ SOLN
0.0000 [IU] | Freq: Every day | INTRAMUSCULAR | Status: DC
  Administered 2022-04-11 – 2022-04-12 (×2): 3 [IU] via SUBCUTANEOUS
  Administered 2022-04-14: 2 [IU] via SUBCUTANEOUS
  Administered 2022-04-15: 4 [IU] via SUBCUTANEOUS

## 2022-04-10 MED ORDER — CHLORHEXIDINE GLUCONATE 0.12 % MT SOLN: 15.0000 mL | Freq: Once | OROMUCOSAL | Status: AC

## 2022-04-10 MED ORDER — MIDAZOLAM HCL 2 MG/2ML IJ SOLN: 2.0000 mg | Freq: Once | INTRAMUSCULAR | Status: AC

## 2022-04-10 MED ORDER — OXYCODONE HCL 5 MG/5ML PO SOLN: 5.0000 mg | Freq: Once | ORAL | Status: DC | PRN

## 2022-04-10 MED ORDER — LACTATED RINGERS IV SOLN: INTRAVENOUS | Status: DC

## 2022-04-10 MED ORDER — VANCOMYCIN HCL 500 MG IV SOLR
INTRAVENOUS | Status: AC
Start: 2022-04-10 — End: ?
  Filled 2022-04-10: qty 10

## 2022-04-10 MED ORDER — OXYCODONE HCL 5 MG PO TABS: 5.0000 mg | ORAL_TABLET | Freq: Once | ORAL | Status: DC | PRN

## 2022-04-10 MED ORDER — MIDAZOLAM HCL 2 MG/2ML IJ SOLN
INTRAMUSCULAR | Status: AC
  Administered 2022-04-10: 2 mg via INTRAVENOUS
  Filled 2022-04-10: qty 2

## 2022-04-10 MED ORDER — INSULIN ASPART 100 UNIT/ML IJ SOLN
0.0000 [IU] | INTRAMUSCULAR | Status: DC
  Administered 2022-04-10: 3 [IU] via SUBCUTANEOUS
  Administered 2022-04-10: 7 [IU] via SUBCUTANEOUS
  Administered 2022-04-10: 5 [IU] via SUBCUTANEOUS

## 2022-04-10 MED ORDER — PROPOFOL 500 MG/50ML IV EMUL
INTRAVENOUS | Status: DC | PRN
  Administered 2022-04-10: 100 ug/kg/min via INTRAVENOUS

## 2022-04-10 MED ORDER — BUPIVACAINE HCL (PF) 0.5 % IJ SOLN
INTRAMUSCULAR | Status: AC
  Filled 2022-04-10: qty 30

## 2022-04-10 MED ORDER — PROPOFOL 10 MG/ML IV BOLUS
INTRAVENOUS | Status: AC
  Filled 2022-04-10: qty 20

## 2022-04-10 MED ORDER — BUPIVACAINE-EPINEPHRINE (PF) 0.5% -1:200000 IJ SOLN
INTRAMUSCULAR | Status: DC | PRN
  Administered 2022-04-10: 30 mL via PERINEURAL

## 2022-04-10 MED ORDER — 0.9 % SODIUM CHLORIDE (POUR BTL) OPTIME
TOPICAL | Status: DC | PRN
  Administered 2022-04-10: 1000 mL

## 2022-04-10 MED ORDER — MIDAZOLAM HCL 2 MG/2ML IJ SOLN
INTRAMUSCULAR | Status: AC
Start: 2022-04-10 — End: ?
  Filled 2022-04-10: qty 2

## 2022-04-10 MED ORDER — FENTANYL CITRATE (PF) 100 MCG/2ML IJ SOLN
25.0000 ug | INTRAMUSCULAR | Status: DC | PRN
  Administered 2022-04-10: 50 ug via INTRAVENOUS

## 2022-04-10 MED ORDER — INSULIN ASPART 100 UNIT/ML IJ SOLN: 0.0000 [IU] | INTRAMUSCULAR | Status: DC | PRN

## 2022-04-10 MED ORDER — INSULIN ASPART 100 UNIT/ML IJ SOLN
0.0000 [IU] | Freq: Three times a day (TID) | INTRAMUSCULAR | Status: DC
  Administered 2022-04-11 (×2): 5 [IU] via SUBCUTANEOUS
  Administered 2022-04-11: 7 [IU] via SUBCUTANEOUS
  Administered 2022-04-12: 3 [IU] via SUBCUTANEOUS
  Administered 2022-04-12 (×2): 2 [IU] via SUBCUTANEOUS
  Administered 2022-04-13: 3 [IU] via SUBCUTANEOUS
  Administered 2022-04-13 – 2022-04-14 (×2): 5 [IU] via SUBCUTANEOUS
  Administered 2022-04-14: 2 [IU] via SUBCUTANEOUS
  Administered 2022-04-15: 1 [IU] via SUBCUTANEOUS
  Administered 2022-04-15 – 2022-04-16 (×2): 2 [IU] via SUBCUTANEOUS
  Administered 2022-04-16: 9 [IU] via SUBCUTANEOUS

## 2022-04-10 MED ORDER — MEPERIDINE HCL 25 MG/ML IJ SOLN: 6.2500 mg | INTRAMUSCULAR | Status: DC | PRN

## 2022-04-10 MED ORDER — FENTANYL CITRATE (PF) 250 MCG/5ML IJ SOLN
INTRAMUSCULAR | Status: AC
  Filled 2022-04-10: qty 5

## 2022-04-10 SURGICAL SUPPLY — 28 items
BNDG ELASTIC 4X5.8 VLCR STR LF (GAUZE/BANDAGES/DRESSINGS) ×1 IMPLANT
BUR EGG ELITE 4.0 (BURR) ×1 IMPLANT
CANISTER WOUND CARE 500ML ATS (WOUND CARE) ×1 IMPLANT
CNTNR URN SCR LID CUP LEK RST (MISCELLANEOUS) IMPLANT
CONT SPEC 4OZ STRL OR WHT (MISCELLANEOUS) ×6
COVER SURGICAL LIGHT HANDLE (MISCELLANEOUS) ×3 IMPLANT
CUFF TOURN SGL QUICK 24 (TOURNIQUET CUFF) ×2
CUFF TRNQT CYL 24X4X40X1 (TOURNIQUET CUFF) IMPLANT
DRSG VAC ATS SM SENSATRAC (GAUZE/BANDAGES/DRESSINGS) ×1 IMPLANT
ELECT REM PT RETURN 9FT ADLT (ELECTROSURGICAL) ×2
ELECTRODE REM PT RTRN 9FT ADLT (ELECTROSURGICAL) ×2 IMPLANT
GAUZE SPONGE 4X4 12PLY STRL (GAUZE/BANDAGES/DRESSINGS) ×1 IMPLANT
GLOVE INDICATOR 7.5 STRL GRN (GLOVE) ×1 IMPLANT
GOWN STRL REUS W/ TWL LRG LVL3 (GOWN DISPOSABLE) ×4 IMPLANT
GOWN STRL REUS W/TWL LRG LVL3 (GOWN DISPOSABLE) ×4
KIT BASIN OR (CUSTOM PROCEDURE TRAY) ×3 IMPLANT
KIT STIMULAN RAPID CURE 5CC (Orthopedic Implant) ×1 IMPLANT
KIT TURNOVER KIT B (KITS) ×3 IMPLANT
MANIFOLD NEPTUNE II (INSTRUMENTS) ×3 IMPLANT
NS IRRIG 1000ML POUR BTL (IV SOLUTION) ×3 IMPLANT
PACK ORTHO EXTREMITY (CUSTOM PROCEDURE TRAY) ×3 IMPLANT
PAD ARMBOARD 7.5X6 YLW CONV (MISCELLANEOUS) ×5 IMPLANT
SOL PREP POV-IOD 4OZ 10% (MISCELLANEOUS) ×6 IMPLANT
SWAB COLLECTION DEVICE MRSA (MISCELLANEOUS) ×1 IMPLANT
SWAB CULTURE ESWAB REG 1ML (MISCELLANEOUS) ×1 IMPLANT
TOWEL GREEN STERILE (TOWEL DISPOSABLE) ×3 IMPLANT
TUBE CONNECTING 12X1/4 (SUCTIONS) ×3 IMPLANT
YANKAUER SUCT BULB TIP NO VENT (SUCTIONS) ×3 IMPLANT

## 2022-04-10 NOTE — H&P (Signed)
History and Physical Interval Note:  04/10/2022 9:42 AM  Katherine Herrera  has presented today for surgery, with the diagnosis of osteomyelitis, diabetic foot infection.  The various methods of treatment have been discussed with the patient and family. After consideration of risks, benefits and other options for treatment, the patient has consented to   Procedure(s): APPLICATION OF WOUND VAC (Right) IRRIGATION AND DEBRIDEMENT FOOT (Right) as a surgical intervention.  The patient's history has been reviewed, patient examined, no change in status, stable for surgery.  I have reviewed the patient's chart and labs.  Questions were answered to the patient's satisfaction.     [name]

## 2022-04-10 NOTE — Anesthesia Preprocedure Evaluation (Addendum)
Anesthesia Evaluation  Patient identified by MRN, date of birth, ID band Patient awake    Reviewed: Allergy & Precautions, NPO status , Patient's Chart, lab work & pertinent test results  History of Anesthesia Complications Negative for: history of anesthetic complications  Airway Mallampati: II  TM Distance: >3 FB Neck ROM: Full    Dental  (+) Dental Advisory Given   Pulmonary COPD, Current Smoker and Patient abstained from smoking.,    breath sounds clear to auscultation       Cardiovascular negative cardio ROS   Rhythm:Regular Rate:Normal  '20 ECHO: EF 60-65%, normal LVF, normal RVF, no significant valvular abnormalities   Neuro/Psych  Headaches, Anxiety Depression Chronic neck and back pain    GI/Hepatic negative GI ROS, Neg liver ROS,   Endo/Other  diabetes (glu 236), Insulin DependentHypothyroidism obese  Renal/GU Renal InsufficiencyRenal disease     Musculoskeletal   Abdominal (+) + obese,   Peds  Hematology   Anesthesia Other Findings   Reproductive/Obstetrics                            Anesthesia Physical Anesthesia Plan  ASA: 3  Anesthesia Plan: Regional   Post-op Pain Management: Tylenol PO (pre-op)*   Induction:   PONV Risk Score and Plan: 1 and Ondansetron and Treatment may vary due to age or medical condition  Airway Management Planned: Natural Airway and Simple Face Mask  Additional Equipment: None  Intra-op Plan:   Post-operative Plan:   Informed Consent: I have reviewed the patients History and Physical, chart, labs and discussed the procedure including the risks, benefits and alternatives for the proposed anesthesia with the patient or authorized representative who has indicated his/her understanding and acceptance.     Dental advisory given  Plan Discussed with: CRNA and Surgeon  Anesthesia Plan Comments: (Plan routine monitors, popliteal block with  MAC)        Anesthesia Quick Evaluation

## 2022-04-10 NOTE — Anesthesia Procedure Notes (Signed)
Procedure Name: MAC Date/Time: 04/10/2022 9:50 AM  Performed by: Eligha Bridegroom, CRNAPre-anesthesia Checklist: Patient identified, Emergency Drugs available, Suction available, Patient being monitored and Timeout performed Patient Re-evaluated:Patient Re-evaluated prior to induction Oxygen Delivery Method: Nasal cannula Preoxygenation: Pre-oxygenation with 100% oxygen

## 2022-04-10 NOTE — Progress Notes (Signed)
Pharmacy Antibiotic Note  Katherine Herrera is a 56 y.o. female admitted on 04/08/2022 with  osteomyelitis .  Pharmacy has been consulted for vancomycin and cefepime dosing.  IV antibiotics were started on 6/9 but then [place] pending biopsy and culture results. Has been over 24 hours since last vancomycin dose. S/p I&D 6/11. Patient has remained afebrile with WBC WNL.   Plan: Cefepime 2g q8h Vancomycin 2000 mg x1, then vancomycin 1250 mg q24h (eAUC 467, SCr 1.09, Vd 0.5) Trend WBC, temp, renal function  F/U infectious work-up Drug levels as indicated  Height: '5\' 9"'$  (175.3 cm) Weight: 98.4 kg (217 lb) IBW/kg (Calculated) : 66.2  Temp (24hrs), Avg:97.7 F (36.5 C), Min:97.1 F (36.2 C), Max:98.5 F (36.9 C)  Recent Labs  Lab 04/08/22 1220 04/08/22 1636 04/09/22 0052 04/10/22 0132  WBC 9.4  --  7.0 6.8  CREATININE 1.24*  --  1.08* 1.09*  LATICACIDVEN  --  1.1  --   --     Estimated Creatinine Clearance: 72 mL/min (A) (by C-G formula based on SCr of 1.09 mg/dL (H)).    Allergies  Allergen Reactions   Cephalexin Nausea And Vomiting   Demerol  [Meperidine Hcl] Rash   Lovastatin Other (See Comments)    Possible myalgia    Metformin And Related Nausea And Vomiting   Sulfamethoxazole-Trimethoprim     Other reaction(s): Unknown DILI, pancreatitis   Crestor [Rosuvastatin Calcium]     myalgia   Hydromorphone     Other reaction(s): Unknown   Niacin Other (See Comments)    Unknown    Other Other (See Comments)   Hydromorphone Hcl Itching    Patient has been tolerating Hydromorphone tablets without adverse effect (07/09/19) Other reaction(s): Unknown   Paroxetine Other (See Comments)    Unknown     Antimicrobials this admission: CTX x1 6/9 Vanc 6/9 x1, 6/11 >> Flagyl 6/9 >>  Cefepime 6/9 >>   Dose adjustments this admission:   Microbiology results: 6/9 BCx: NGTD 6/11 Wound Cx: IP    Thank you for allowing pharmacy to be a part of this patient's care.  Pauletta Browns 04/10/2022 1:06 PM

## 2022-04-10 NOTE — Progress Notes (Signed)
PROGRESS NOTE    CIERRIA HEIGHT  IOX:[id number] DOB: Dec 27, 1965 DOA: 04/08/2022 PCP: Cassandria Anger, MD    Chief Complaint  Patient presents with   Foot Pain    Infection     Brief Narrative:  Patient 56 year old female with history of type 2 diabetes, history of childhood seizures, chronic pain syndrome, hyperlipidemia, hypothyroidism, chronic osteomyelitis of the right foot who was sent to the ED from wound care center for IV antibiotics due to worsening right foot wound, concern for worsening osteomyelitis.  Patient initially placed on IV antibiotics.  Admitted.  Podiatry consulted.    Assessment & Plan:  Principal Problem:   Osteomyelitis of foot ([place]) Active Problems:   AKI (acute kidney injury) ([place])   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)   Essential hypertension   Hypothyroidism   Pure hypercholesterolemia   Anxiety disorder   Chronic pain    Assessment and Plan: * Osteomyelitis of foot ([place]) 56 year old female with known chronic osteomyelitis of right 5th metatarsal in setting of T2DM who presented to ED for worsening wound and IV abx from wound care  -blood cultures pending, lactic acid 1.1  -no criteria for sepsis -Patient started empirically on IV vancomycin, IV cefepime, IV Flagyl.   -ABIs checked in 12/2021 and wnl  -MRI in 02/2022: Chronic wound at the lateral aspect of the fifth TMT joint with underlying chronic osteomyelitis of the cuboid and fourth and fifth metatarsal bases. No abscess. -podiatry consulted-Dr. Sherryle Lis assessed the patient and patient went to the OR had cultures of biopsies done in addition to irrigation and debridement as well as implantation of antibiotic beads and wound VAC today 04/10/2022.   -IV antibiotics were initially held 04/09/2022 until biopsies and cultures were obtained which was done today.   -Resume IV cefepime and IV vancomycin.   -Continue IV Flagyl.   -We will likely benefit from ID consultation  early next week for antibiotic duration and recommendations -Per podiatry.    AKI (acute kidney injury) ([place]) Renal function fluctuates. AKI vs. Baseline CKD Bolus in ED and patient placed on IV fluids.   -Renal function improved. Strict I/O Hold nephrotoxic drugs Continue to hold baclofen and lisinopril.   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) A1C in 10/2021 was 7.7 -Repeat hemoglobin A1c 7.5 (04/10/2022). -Patient noted to be hypoglycemic on admission likely secondary to acute infection, no signs of DKA. Osteomyelitis likely driving sugar uP. -Patient on 70/30 which has been discontinued and patient on long-acting insulin of Semglee 20 units in addition to SSI.  -Once no further procedures anticipated we will place back on home regimen 70/30.  -SSI.  Essential hypertension -Blood pressure currently stable.   -Lisinopril on hold secondary to AKI.   -Follow.  Hypothyroidism TSH wnl in 08/2021 Continue home synthroid   Pure hypercholesterolemia Continue zetia. Statin intolerance.  Recent LDL of 135   Anxiety disorder Continue prozac and klonopin   Chronic pain Continue home oxycodone.  pmp website reviewed and fills correctly   COPD (chronic obstructive pulmonary disease) (HCC)-resolved as of 04/08/2022 No signs of exacerbation          DVT prophylaxis: Lovenox Code Status: Full Family Communication: Updated patient.  No family at bedside. Disposition: To be determined.  Likely home when cleared by podiatry.  Status is: Inpatient Remains inpatient appropriate because: Severity of illness.   Consultants:  Podiatry: Dr. Sherryle Lis 04/09/2022  Procedures:  Plain films right foot 04/08/2022 Irrigation and debridement  right foot/application of wound VAC/placement of antibiotic beads per Dr. Sherryle Lis, podiatry 04/10/2022.  Antimicrobials:  IV cefepime 04/08/2022>>>> 04/09/2022 IV Flagyl 04/08/2022>>>>  IV vancomycin 04/08/2022>>>> 04/09/2022 IV vancomycin  04/10/2022>>>> IV cefepime 04/10/2022>>>>>>   Subjective: Patient sitting up in bed.  Returned from the OR from this morning.  No chest pain.  No shortness of breath.  No abdominal pain.  Eating her lunch.    Objective: Vitals:   04/10/22 1100 04/10/22 1113 04/10/22 1129 04/10/22 1144  BP:  107/73 107/68 132/87  Pulse:  65 61 62  Resp:  11 10   Temp: (!) 97.1 F (36.2 C)   (!) 97.1 F (36.2 C)  TempSrc:      SpO2:  95% 93% 93%  Weight:      Height:        Intake/Output Summary (Last 24 hours) at 04/10/2022 1546 Last data filed at 04/10/2022 1047 Gross per 24 hour  Intake 741.09 ml  Output --  Net 741.09 ml   Filed Weights   04/08/22 1127  Weight: 98.4 kg    Examination:  General exam: NAD. Respiratory system: CTA B.  No wheezes, no crackles, no rhonchi.  Normal respiratory effort.   Cardiovascular system: Regular rate rhythm no murmurs rubs or gallops.  No JVD.  Gastrointestinal system: Abdomen is soft, nontender, nondistended, positive bowel sounds.  No rebound.  No guarding.  Central nervous system: Alert and oriented. No focal neurological deficits. Extremities: Right foot bandaged with postop bandage, wound VAC noted.  Skin: No rashes, lesions or ulcers Psychiatry: Judgement and insight appear normal. Mood & affect appropriate.          Data Reviewed:   CBC: Recent Labs  Lab 04/08/22 1220 04/09/22 0052 04/10/22 0132  WBC 9.4 7.0 6.8  NEUTROABS 7.3  --  3.6  HGB 14.2 12.4 12.9  HCT 43.7 38.5 39.9  MCV 87.9 88.7 87.1  PLT 261 250 665    Basic Metabolic Panel: Recent Labs  Lab 04/08/22 1220 04/09/22 0052 04/10/22 0132  NA 135 137 136  K 4.7 4.7 4.5  CL 99 100 98  CO2 '28 29 31  '$ GLUCOSE 402* 173* 204*  BUN '18 18 19  '$ CREATININE 1.24* 1.08* 1.09*  CALCIUM 8.8* 8.4* 8.6*    GFR: Estimated Creatinine Clearance: 72 mL/min (A) (by C-G formula based on SCr of 1.09 mg/dL (H)).  Liver Function Tests: Recent Labs  Lab 04/08/22 1220  AST 12*   ALT 12  ALKPHOS 96  BILITOT 0.4  PROT 7.2  ALBUMIN 3.2*    CBG: Recent Labs  Lab 04/09/22 1203 04/09/22 1630 04/09/22 1945 04/10/22 0806 04/10/22 1101  GLUCAP 196* 240* 128* 236* 211*     Recent Results (from the past 240 hour(s))  Blood culture (routine x 2)     Status: None (Preliminary result)   Collection Time: 04/08/22 12:20 PM   Specimen: BLOOD  Result Value Ref Range Status   Specimen Description BLOOD LEFT ANTECUBITAL  Final   Special Requests   Final    BOTTLES DRAWN AEROBIC AND ANAEROBIC Blood Culture results may not be optimal due to an inadequate volume of blood received in culture bottles   Culture   Final    NO GROWTH < 24 HOURS Performed at [facility], [place] [address] [place], [place] [address]    Report Status PENDING  Incomplete  Blood culture (routine x 2)     Status: None (Preliminary result)   Collection Time: 04/08/22  4:30 PM   Specimen: BLOOD LEFT HAND  Result Value Ref Range Status   Specimen Description BLOOD LEFT HAND  Final   Special Requests   Final    BOTTLES DRAWN AEROBIC AND ANAEROBIC Blood Culture adequate volume   Culture   Final    NO GROWTH < 24 HOURS Performed at [facility], [address], [place], [place] [address]    Report Status PENDING  Incomplete  Surgical PCR screen     Status: None   Collection Time: 04/09/22  7:20 PM   Specimen: Nasal Mucosa; Nasal Swab  Result Value Ref Range Status   MRSA, PCR NEGATIVE NEGATIVE Final   Staphylococcus aureus NEGATIVE NEGATIVE Final    Comment: (NOTE) The Xpert SA Assay (FDA approved for NASAL specimens in patients 41 years of age and older), is one component of a comprehensive surveillance program. It is not intended to diagnose infection nor to guide or monitor treatment. Performed at [facility], [place] [address], [place], [place] [address]          Radiology Studies: No results found.      Scheduled Meds:  bisacodyl  10 mg Oral QHS    docusate sodium  100 mg Oral QHS   enoxaparin (LOVENOX) injection  40 mg Subcutaneous Q24H   ezetimibe  10 mg Oral Daily   fentaNYL       FLUoxetine  10 mg Oral Daily   gabapentin  800 mg Oral TID   insulin aspart  0-9 Units Subcutaneous Q4H   insulin glargine-yfgn  20 Units Subcutaneous Daily   lamoTRIgine  25 mg Oral BID   levothyroxine  75 mcg Oral QAC breakfast   mupirocin ointment  1 application  Nasal BID   rOPINIRole  0.5 mg Oral Daily   Continuous Infusions:  ceFEPime (MAXIPIME) IV 2 g (04/10/22 1449)   metronidazole 500 mg (04/10/22 0525)   [START ON 04/11/2022] vancomycin     vancomycin 2,000 mg (04/10/22 1545)     LOS: 2 days    Time spent: 40 minutes.    Irine Seal, MD [facility]   To contact the attending provider between 7A-7P or the covering provider during after hours 7P-7A, please log into the web site [email or web address] and access using universal [place] password for that web site. If you do not have the password, please call the hospital operator.  04/10/2022, 3:46 PM

## 2022-04-10 NOTE — Brief Op Note (Signed)
04/10/2022  11:01 AM  PATIENT:  Katherine Herrera  56 y.o. female  PRE-OPERATIVE DIAGNOSIS:  osteomyelitis, foot ulcer  POST-OPERATIVE DIAGNOSIS:  osteomyelitis, foot ulcer  PROCEDURE:  Procedure(s): APPLICATION OF WOUND VAC, PLACEMENT OF ANTIBIOTIC BEADS (Right) IRRIGATION AND DEBRIDEMENT FOOT (Right)  SURGEON:  Surgeon(s) and Role:    * Neale Marzette, Stephan Minister, DPM - Primary    ASSISTANTS: none   ANESTHESIA:   regional and MAC  EBL: Minimal  BLOOD ADMINISTERED:none  DRAINS: none   LOCAL MEDICATIONS USED:  NONE  SPECIMEN: Wound biopsy, fifth metatarsal biopsy, cuboid biopsy, deep tissue culture  DISPOSITION OF SPECIMEN: Wound biopsy, fifth metatarsal bone biopsy, cuboid bone biopsy sent to pathology, deep tissue culture sent to microbiology  COUNTS:  YES  TOURNIQUET:  * No tourniquets in log *  DICTATION: .Note written in EPIC  PLAN OF CARE: Admit to inpatient   PATIENT DISPOSITION:  PACU - hemodynamically stable.   Delay start of Pharmacological VTE agent (>24hrs) due to surgical blood loss or risk of bleeding: yes

## 2022-04-10 NOTE — Anesthesia Procedure Notes (Signed)
Anesthesia Regional Block: Popliteal block   Pre-Anesthetic Checklist: , timeout performed,  Correct Patient, Correct Site, Correct Laterality,  Correct Procedure, Correct Position, site marked,  Risks and benefits discussed,  Surgical consent,  Pre-op evaluation,  At surgeon's request and post-op pain management  Laterality: Right and Lower  Prep: chloraprep       Needles:  Injection technique: Single-shot  Needle Type: Echogenic Needle     Needle Length: 9cm  Needle Gauge: 21     Additional Needles:   Procedures:,,,, ultrasound used (permanent image in chart),,    Narrative:  Start time: 04/10/2022 8:56 AM End time: 04/10/2022 9:02 AM Injection made incrementally with aspirations every 5 mL.  Performed by: Personally  Anesthesiologist: Annye Asa, MD  Additional Notes: Pt identified in Holding room.  Monitors applied. Working IV access confirmed. Sterile prep R lateral knee.  #21ga ECHOgenic Arrow block needle to sciatic nerve at split in pop fossa with US guidance.  30cc 0.5% Bupivacaine with 1:200k epi injected incrementally after negative test dose.  Patient asymptomatic, VSS, no heme aspirated, tolerated well.   Jenita Seashore, MD

## 2022-04-10 NOTE — Transfer of Care (Signed)
[facility]22$Immediate Anesthesia Transfer of Care Note  Patient: Katherine Herrera  Procedure(s) Performed: APPLICATION OF WOUND VAC, PLACEMENT OF ANTIBIOTIC BEADS (Right: Foot) IRRIGATION AND DEBRIDEMENT FOOT (Right: Foot)  Patient Location: PACU  Anesthesia Type:GA combined with regional for post-op pain  Level of Consciousness: awake, alert  and oriented  Airway & Oxygen Therapy: Patient Spontanous Breathing and Patient connected to nasal cannula oxygen  Post-op Assessment: Report given to RN and Post -op Vital signs reviewed and stable  Post vital signs: Reviewed and stable  Last Vitals:  Vitals Value Taken Time  BP 112/66 04/10/22 1059  Temp    Pulse 71 04/10/22 1104  Resp 10 04/10/22 1104  SpO2 91 % 04/10/22 1104  Vitals shown include unvalidated device data.  Last Pain:  Vitals:   04/10/22 0732  TempSrc: Oral  PainSc:       Patients Stated Pain Goal: 2 (80/03/49 1791)  Complications: No notable events documented.

## 2022-04-10 NOTE — Anesthesia Postprocedure Evaluation (Signed)
Anesthesia Post Note  Patient: Katherine Herrera  Procedure(s) Performed: APPLICATION OF WOUND VAC, PLACEMENT OF ANTIBIOTIC BEADS (Right: Foot) IRRIGATION AND DEBRIDEMENT FOOT (Right: Foot)     Patient location during evaluation: PACU Anesthesia Type: Regional Level of consciousness: awake and alert, patient cooperative and oriented Pain management: pain level controlled Vital Signs Assessment: post-procedure vital signs reviewed and stable Respiratory status: spontaneous breathing, nonlabored ventilation and respiratory function stable Cardiovascular status: stable and blood pressure returned to baseline Postop Assessment: no apparent nausea or vomiting Anesthetic complications: no Comments: Pt states her R toes are numb and tingly, foot heavy, pain controlled with block plus fentanyl   No notable events documented.  Last Vitals:  Vitals:   04/10/22 1129 04/10/22 1144  BP: 107/68 132/87  Pulse: 61 62  Resp: 10   Temp:    SpO2: 93% 93%    Last Pain:  Vitals:   04/10/22 1129  TempSrc:   PainSc: Asleep                 Chanetta Moosman,E. Cheney Gosch

## 2022-04-10 NOTE — Op Note (Addendum)
Patient Name: Katherine Herrera DOB: July 11, 1966  MRN: [id number]   Date of Service: 04/08/2022 - 04/10/2022  Surgeon: Dr. Lanae Crumbly, DPM Assistants: None Pre-operative Diagnosis:  1) chronic ulcer of the right foot 2) diabetic foot infection 3) osteomyelitis right foot Post-operative Diagnosis:  1) chronic ulcer of the right foot 2) diabetic foot infection 3) osteomyelitis right foot Procedures:  1) incision bone cortex foot right  2) application of resorbable antibiotic beads  3) application of negative pressure wound therapy 30 cm Pathology/Specimens: ID Type Source Tests Collected by Time Destination  1 : Right 5th Metatarsal Bone Biopsy  Tissue PATH Bone biopsy SURGICAL PATHOLOGY [name], DPM 04/10/2022 1021   2 : Right Cuboid Bone Biopsy  Tissue PATH Bone biopsy SURGICAL PATHOLOGY [name], DPM 04/10/2022 1026   3 : Right Foot Wound Biopsy  Tissue PATH Soft tissue SURGICAL PATHOLOGY [name], DPM 04/10/2022 1029   A : Right Foot Wound  Wound Wound AEROBIC/ANAEROBIC CULTURE W GRAM STAIN (SURGICAL/DEEP WOUND) [name], DPM 04/10/2022 1031    Anesthesia: Regional block with monitored anesthesia care Hemostasis:  Total Tourniquet Time Documented: Calf (Right) - 34 minutes Total: Calf (Right) - 34 minutes  Estimated Blood Loss: 10 cc Materials:  Implant Name Type Inv. Item Serial No. Manufacturer Lot No. LRB No. Used Action  KIT STIMULAN RAPID CURE 5CC - [id number] Orthopedic Implant KIT STIMULAN RAPID CURE 5CC  [employer] [id number] Right 1 Implanted   Medications: 500 mg vancomycin powder integrated into beads Complications: None  Indications for Procedure:  This is a 56 y.o. female with a history of type 2 diabetes recurrent right foot ulcerations well-known to our practice in the wound care center.  The wound acutely worsened and became necrotic.  Previous MRI was concerning for osteomyelitis of the cuboid and fifth metatarsal.  She was  admitted and debridement and biopsy was recommended.   Procedure in Detail: Patient was identified in pre-operative holding area. Formal consent was signed and the right lower extremity was marked. Patient was brought back to the operating room. Anesthesia was induced. The extremity was prepped and draped in the usual sterile fashion. Timeout was taken to confirm patient name, laterality, and procedure prior to incision.   Attention was then directed to the right foot which exhibited a large 5 cm x 6 cm circular ulceration on the lateral foot overlying the fifth metatarsal base.  This had a necrotic eschar which was malodorous and had purulent drainage.  Following inflation of the tourniquet the ulcerated tissue was debrided and excised in a full-thickness manner utilizing a scalpel.  This tissue was sent as a biopsy of the wound.  A deep tissue culture was taken of this and sent to microbiology to obtain a specimen.  Once all of the necrotic soft tissue had been removed the wound was thoroughly irrigated.  I then carried my dissection deep to the level of the fifth metatarsal and cuboid bone.  Periosteal incision was made and elevators were used to expose the bone.  Bone biopsies were taken utilizing a rongeur and sent to pathology.  The bone was then debrided utilizing a high-speed football bur.  The bone was not grossly necrotic here.  It was again thoroughly irrigated of all debris.  Stimulant beads with 500 mg of vancomycin powder integrated were then applied and inset into the wound.  Hemostasis was achieved.  A negative pressure wound therapy VAC device was then applied.  The foot was  then dressed with an Ace wrap. Patient tolerated the procedure well.   Disposition: Following a period of post-operative monitoring, patient will be transferred to the floor.  This will be deferred for staged procedure she will return later this week for soft tissue reconstruction with my partner.

## 2022-04-11 ENCOUNTER — Encounter (HOSPITAL_COMMUNITY): Payer: Self-pay | Admitting: Podiatry

## 2022-04-11 DIAGNOSIS — F419 Anxiety disorder, unspecified: Secondary | ICD-10-CM | POA: Diagnosis not present

## 2022-04-11 DIAGNOSIS — N179 Acute kidney failure, unspecified: Secondary | ICD-10-CM | POA: Diagnosis not present

## 2022-04-11 DIAGNOSIS — M869 Osteomyelitis, unspecified: Secondary | ICD-10-CM | POA: Diagnosis not present

## 2022-04-11 DIAGNOSIS — M86671 Other chronic osteomyelitis, right ankle and foot: Secondary | ICD-10-CM | POA: Diagnosis not present

## 2022-04-11 LAB — CBC WITH DIFFERENTIAL/PLATELET
Abs Immature Granulocytes: 0.03 10*3/uL (ref 0.00–0.07)
Basophils Absolute: 0.1 10*3/uL (ref 0.0–0.1)
Basophils Relative: 1 %
Eosinophils Absolute: 0.2 10*3/uL (ref 0.0–0.5)
Eosinophils Relative: 2 %
HCT: 41.6 % (ref 36.0–46.0)
Hemoglobin: 13.7 g/dL (ref 12.0–15.0)
Immature Granulocytes: 0 %
Lymphocytes Relative: 23 %
Lymphs Abs: 2 10*3/uL (ref 0.7–4.0)
MCH: 28.8 pg (ref 26.0–34.0)
MCHC: 32.9 g/dL (ref 30.0–36.0)
MCV: 87.6 fL (ref 80.0–100.0)
Monocytes Absolute: 0.6 10*3/uL (ref 0.1–1.0)
Monocytes Relative: 7 %
Neutro Abs: 5.6 10*3/uL (ref 1.7–7.7)
Neutrophils Relative %: 67 %
Platelets: 263 10*3/uL (ref 150–400)
RBC: 4.75 MIL/uL (ref 3.87–5.11)
RDW: 13.4 % (ref 11.5–15.5)
WBC: 8.4 10*3/uL (ref 4.0–10.5)
nRBC: 0 % (ref 0.0–0.2)

## 2022-04-11 LAB — GLUCOSE, CAPILLARY
Glucose-Capillary: 257 mg/dL — ABNORMAL HIGH (ref 70–99)
Glucose-Capillary: 298 mg/dL — ABNORMAL HIGH (ref 70–99)
Glucose-Capillary: 346 mg/dL — ABNORMAL HIGH (ref 70–99)
Glucose-Capillary: 252 mg/dL — ABNORMAL HIGH (ref 70–99)

## 2022-04-11 LAB — BASIC METABOLIC PANEL
Anion gap: 10 (ref 5–15)
BUN: 16 mg/dL (ref 6–20)
CO2: 27 mmol/L (ref 22–32)
Calcium: 8.7 mg/dL — ABNORMAL LOW (ref 8.9–10.3)
Chloride: 98 mmol/L (ref 98–111)
Creatinine, Ser: 0.99 mg/dL (ref 0.44–1.00)
GFR, Estimated: >60 mL/min (ref >60)
Glucose, Bld: 249 mg/dL — ABNORMAL HIGH (ref 70–99)
Potassium: 4.8 mmol/L (ref 3.5–5.1)
Sodium: 135 mmol/L (ref 135–145)

## 2022-04-11 MED ORDER — INSULIN ASPART 100 UNIT/ML IJ SOLN
4.0000 [IU] | Freq: Three times a day (TID) | INTRAMUSCULAR | Status: DC
  Administered 2022-04-11: 4 [IU] via SUBCUTANEOUS

## 2022-04-11 MED ORDER — INSULIN GLARGINE-YFGN 100 UNIT/ML ~~LOC~~ SOLN
25.0000 [IU] | Freq: Every day | SUBCUTANEOUS | Status: DC
  Administered 2022-04-11: 25 [IU] via SUBCUTANEOUS
  Filled 2022-04-11: qty 0.25

## 2022-04-11 MED ORDER — INSULIN ASPART 100 UNIT/ML IJ SOLN
6.0000 [IU] | Freq: Three times a day (TID) | INTRAMUSCULAR | Status: DC
  Administered 2022-04-12 – 2022-04-14 (×6): 6 [IU] via SUBCUTANEOUS

## 2022-04-11 MED ORDER — SODIUM CHLORIDE 0.9 % IV SOLN
2.0000 g | INTRAVENOUS | Status: DC
  Administered 2022-04-11 – 2022-04-12 (×2): 2 g via INTRAVENOUS
  Filled 2022-04-11 (×2): qty 20

## 2022-04-11 MED ORDER — INSULIN GLARGINE-YFGN 100 UNIT/ML ~~LOC~~ SOLN
30.0000 [IU] | Freq: Every day | SUBCUTANEOUS | Status: DC
  Administered 2022-04-12: 30 [IU] via SUBCUTANEOUS
  Filled 2022-04-11 (×2): qty 0.3

## 2022-04-11 NOTE — Progress Notes (Signed)
Mobility Specialist Progress Note:   04/11/22 1600  Mobility  Activity Transferred from chair to bed;Transferred to/from [facility]  Level of Assistance Minimal assist, patient does 75% or more  Assistive Device Other (Comment) (HHA)  RLE Weight Bearing NWB  Distance Ambulated (ft) 2 ft  Activity Response Tolerated well  $Mobility charge 1 Mobility   Pt requesting to transfer to [facility] then to bed. Required HHA throughout transfer. Would benefit from using RW, however pt able to maintain NWB with HHA. Back in bed with all needs met.   Nelta Numbers Acute Rehab Secure Chat or Office Phone: [phone]

## 2022-04-11 NOTE — Evaluation (Signed)
Physical Therapy Evaluation Patient Details Name: Katherine Herrera MRN: [id number] DOB: 06/20/1966 Today's Date: 04/11/2022  History of Present Illness  Patient 56 year old female admitted 6/9 due to worsening right foot wound, concern for worsening osteomyelitis. Pt underwent I&D and wound VAC placement right foot on 6/11.  PMH: type 2 diabetes, history of childhood seizures, chronic pain syndrome, hyperlipidemia, hypothyroidism  Clinical Impression  Pt admitted with above diagnosis. Pt was able to stand and pivot to chair with min guard assist and cues for hand placement. Pt was able to maintain weight bearing for pivotal steps but is worried she cant ambulate around house and maintain weight bearing. Pt wants to discuss with daughter d/c plan as she wont be able to get into bathroom with wheelchair or to bedroom and feels that it will be safer for her to use wheelchair intiially. Pt wants to go home but recognizes a SNF stay to incr strength may benefit her.  She will discus and let PT know tomorrow. She does understand that if she gets 3N1 she can use wheelchair longer distances in home and RW  shorter distances.  Will continued PT.  Pt currently with functional limitations due to the deficits listed below (see PT Problem List). Pt will benefit from skilled PT to increase their independence and safety with mobility to allow discharge to the venue listed below.          Recommendations for follow up therapy are one component of a multi-disciplinary discharge planning process, led by the attending physician.  Recommendations may be updated based on patient status, additional functional criteria and insurance authorization.  Follow Up Recommendations Skilled nursing-short term rehab (<3 hours/day) (May do HHPT with 3N1 and wheelchair)    Assistance Recommended at Discharge Intermittent Supervision/Assistance  Patient can return home with the following  A little help with walking and/or transfers;A  little help with bathing/dressing/bathroom;Assistance with cooking/housework;Assist for transportation    Equipment Recommendations BSC/3in1;Wheelchair (measurements PT);Wheelchair cushion (measurements PT)  Recommendations for Other Services       Functional Status Assessment Patient has had a recent decline in their functional status and demonstrates the ability to make significant improvements in function in a reasonable and predictable amount of time.     Precautions / Restrictions Precautions Precautions: Fall Precaution Comments: Wound VAC right foot Restrictions Weight Bearing Restrictions: Yes RLE Weight Bearing: Non weight bearing      Mobility  Bed Mobility Overal bed mobility: Independent                  Transfers Overall transfer level: Needs assistance Equipment used: Rolling walker (2 wheels) Transfers: Sit to/from Stand, Bed to chair/wheelchair/BSC Sit to Stand: Min guard   Step pivot transfers: Min guard, Min assist       General transfer comment: Pt did not need assist with power up, did need cues for hand placement. Pt able to maintain NWB right LE for transfer bed to chair.    Ambulation/Gait                  Stairs            Wheelchair Mobility    Modified Rankin (Stroke Patients Only)       Balance Overall balance assessment: Needs assistance Sitting-balance support: No upper extremity supported, Feet supported Sitting balance-Leahy Scale: Fair     Standing balance support: Bilateral upper extremity supported, During functional activity Standing balance-Leahy Scale: Poor Standing balance comment: relies on UE support for balance  Pertinent Vitals/Pain Pain Assessment Pain Assessment: 0-10 Pain Score: 7  Pain Location: right foot Pain Descriptors / Indicators: Aching, Grimacing, Guarding Pain Intervention(s): Limited activity within patient's tolerance, Monitored during  session, Repositioned, Premedicated before session    [place] expects to be discharged to:: Private residence Living Arrangements: Alone Available Help at Discharge: Family;Friend(s);Available PRN/intermittently Type of Home: Apartment Home Access: Ramped entrance       Home Layout: One level Home Equipment: Rollator (4 wheels);Shower seat;Cane - single Barista (2 wheels) Additional Comments: shower seat wobbly,    Prior Function               Mobility Comments: Used cane or RW at times per pt ADLs Comments: Did own B/D     Hand Dominance   Dominant Hand: Right    Extremity/Trunk Assessment   Upper Extremity Assessment Upper Extremity Assessment: Defer to OT evaluation    Lower Extremity Assessment Lower Extremity Assessment: RLE deficits/detail RLE: Unable to fully assess due to pain    Cervical / Trunk Assessment Cervical / Trunk Assessment: Normal  Communication   Communication: No difficulties  Cognition Arousal/Alertness: Awake/alert Behavior During Therapy: WFL for tasks assessed/performed Overall Cognitive Status: Within Functional Limits for tasks assessed                                          General Comments      Exercises     Assessment/Plan    PT Assessment Patient needs continued PT services  PT Problem List Decreased activity tolerance;Decreased balance;Decreased mobility;Decreased knowledge of use of DME;Decreased safety awareness;Decreased knowledge of precautions;Cardiopulmonary status limiting activity;Pain;Decreased skin integrity       PT Treatment Interventions DME instruction;Gait training;Functional mobility training;Therapeutic activities;Therapeutic exercise;Balance training;Patient/family education;Wheelchair mobility training    PT Goals (Current goals can be found in the Care Plan section)  Acute Rehab PT Goals Patient Stated Goal: to go home PT Goal Formulation: With  patient Time For Goal Achievement: 04/25/22 Potential to Achieve Goals: Good    Frequency Min 5X/week     Co-evaluation               AM-PAC PT "6 Clicks" Mobility  Outcome Measure Help needed turning from your back to your side while in a flat bed without using bedrails?: None Help needed moving from lying on your back to sitting on the side of a flat bed without using bedrails?: None Help needed moving to and from a bed to a chair (including a wheelchair)?: A Little Help needed standing up from a chair using your arms (e.g., wheelchair or bedside chair)?: A Little Help needed to walk in hospital room?: A Little Help needed climbing 3-5 steps with a railing? : Total 6 Click Score: 18    End of Session Equipment Utilized During Treatment: Gait belt Activity Tolerance: Patient limited by fatigue Patient left: in chair;with call bell/phone within reach;with chair alarm set Nurse Communication: Mobility status;Weight bearing status;Precautions PT Visit Diagnosis: Unsteadiness on feet (R26.81);Muscle weakness (generalized) (M62.81);Pain Pain - Right/Left: Right Pain - part of body: Ankle and joints of foot    Time: 0626-9485 PT Time Calculation (min) (ACUTE ONLY): 15 min   Charges:   PT Evaluation $PT Eval Moderate Complexity: 1 Mod          Braylee Lal M,PT Acute Rehab Services [phone]   Alvira Philips 04/11/2022, 4:58 PM

## 2022-04-11 NOTE — Care Management Important Message (Signed)
Important Message  Patient Details  Name: Katherine Herrera MRN: [id number] Date of Birth: 07-07-1966   Medicare Important Message Given:  Yes     Memory Argue 04/11/2022, 3:37 PM

## 2022-04-11 NOTE — Progress Notes (Signed)
  Subjective:  Patient ID: [name], female    DOB: [birth year],  MRN: [id number]  A 56 y.o. female presents with right foot ulceration status post excisional debridement of the right foot with application of wound VAC postop day 1.  She states she is doing well.  No acute complaints.  Denies any nausea fever chills vomiting.  Minimal pain in the foot.  Wound VAC is functioning well  Objective:   Vitals:   04/11/22 1200 04/11/22 1626  BP: (!) 120/50 128/78  Pulse:  81  Resp:  17  Temp:  98.6 F (37 C)  SpO2:  96%   General AA&O x3. Normal mood and affect.  Vascular Dorsalis pedis and posterior tibial pulses 2/4 bilat. Brisk capillary refill to all digits. Pedal hair present.  Neurologic Epicritic sensation grossly intact.  Dermatologic Bandages clean dry and intact.  No strikethrough noted.  No calf pain noted.  Wound vacs functioning well.  No signs of redness noted.  Orthopedic: MMT 5/5 in dorsiflexion, plantarflexion, inversion, and eversion. Normal joint ROM without pain or crepitus.    Assessment & Plan:  Patient was evaluated and treated and all questions answered.  Right foot diabetic foot ulceration status post excisional debridement with application of wound VAC -All questions and concerns were discussed with the patient in extensive detail -I will plan on taking her back to the operating room on Wednesday for further debridement with a possible of Integra application -Continue to follow OR culture -Antibiotics recommendation per infectious disease -Nonweightbearing to the right lower extremity -N.p.o. after midnight on Tuesday  Felipa Furnace, DPM  Accessible via secure chat for questions or concerns.

## 2022-04-11 NOTE — Progress Notes (Signed)
PROGRESS NOTE    Katherine Herrera  GGE:[id number] DOB: 1966-06-06 DOA: 04/08/2022 PCP: Cassandria Anger, MD    Chief Complaint  Katherine Herrera presents with   Foot Pain    Infection     Brief Narrative:  Katherine Herrera with history of type 2 diabetes, history of childhood seizures, chronic pain syndrome, hyperlipidemia, hypothyroidism, chronic osteomyelitis of the right foot who was sent to the ED from wound care center for IV antibiotics due to worsening right foot wound, concern for worsening osteomyelitis.  Katherine Herrera initially placed on IV antibiotics.  Admitted.  Podiatry consulted.    Assessment & Plan:  Principal Problem:   Osteomyelitis of foot ([place]) Active Problems:   AKI (acute kidney injury) ([place])   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)   Essential hypertension   Hypothyroidism   Pure hypercholesterolemia   Anxiety disorder   Chronic pain    Assessment and Plan: * Osteomyelitis of foot ([place]) 56 year old Herrera with known chronic osteomyelitis of right 5th metatarsal in setting of T2DM who presented to ED for worsening wound and IV abx from wound care  -blood cultures pending, lactic acid 1.1  -no criteria for sepsis -Katherine Herrera started empirically on IV vancomycin, IV cefepime, IV Flagyl.   -ABIs checked in 12/2021 and wnl  -MRI in 02/2022: Chronic wound at the lateral aspect of the fifth TMT joint with underlying chronic osteomyelitis of the cuboid and fourth and fifth metatarsal bases. No abscess. -podiatry consulted-Dr. Sherryle Lis assessed the Katherine Herrera and Katherine Herrera went to the OR had cultures of biopsies done in addition to irrigation and debridement as well as implantation of antibiotic beads and wound VAC  04/10/2022.   -IV antibiotics were initially held 04/09/2022 until biopsies and cultures were obtained which was done.  -Katherine Herrera with staged procedure, per podiatry will return later this week for soft tissue reconstruction. -Resumed IV  cefepime and IV vancomycin postoperatively.   -Continue IV Flagyl.   -ID consulted and IV cefepime has been discontinued and Katherine Herrera placed on IV Rocephin. -Continue IV Flagyl, IV vancomycin per ID recommendations. -ID and podiatry following and I appreciate the input and recommendations.    AKI (acute kidney injury) ([place]) Renal function fluctuates. AKI vs. Baseline CKD Bolus in ED and Katherine Herrera placed on IV fluids.   -Renal function improved. Strict I/O Hold nephrotoxic drugs Continue to hold baclofen and lisinopril.   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) A1C in 10/2021 was 7.7 -Repeat hemoglobin A1c 7.5 (04/10/2022). -Katherine Herrera noted to be hypoglycemic on admission likely secondary to acute infection, no signs of DKA. Osteomyelitis likely driving sugar uP. -Katherine Herrera on 70/30 which has been discontinued and Katherine Herrera on long-acting insulin of Semglee 20 units in addition to SSI.  -Once no further procedures anticipated we will place back on home regimen 70/30.  -Increase Semglee to 25 units daily. -Place on NovoLog meal coverage 4 units 3 times daily with meals. -SSI.  Essential hypertension -Blood pressure currently stable.   -Lisinopril on hold secondary to AKI.   -Follow.  Hypothyroidism TSH wnl in 08/2021 Continue home synthroid   Pure hypercholesterolemia Continue zetia. Statin intolerance.  Recent LDL of 135   Anxiety disorder Continue prozac and klonopin   Chronic pain Continue home oxycodone.  pmp website reviewed and fills correctly   COPD (chronic obstructive pulmonary disease) (HCC)-resolved as of 04/08/2022 No signs of exacerbation          DVT prophylaxis: Lovenox Code Status: Full Family Communication:  Updated Katherine Herrera.  No family at bedside. Disposition: SNF versus home with home health when cleared by podiatry.   Status is: Inpatient Remains inpatient appropriate because: Severity of illness.   Consultants:  Podiatry: Dr. Sherryle Lis  04/09/2022 ID: Dr. Candiss Norse 04/11/2022  Procedures:  Plain films right foot 04/08/2022 Irrigation and debridement right foot/application of wound VAC/placement of antibiotic beads per Dr. Sherryle Lis, podiatry 04/10/2022.  Antimicrobials:  IV cefepime 04/08/2022>>>> 04/09/2022 IV Flagyl 04/08/2022>>>>  IV vancomycin 04/08/2022>>>> 04/09/2022 IV vancomycin 04/10/2022>>>> IV cefepime 04/10/2022>>>>>> 04/11/2022 IV Rocephin 04/11/2022   Subjective: Sleeping but easily arousable.  No chest pain.  No shortness of breath.  No abdominal pain.  Complain of pain in the right foot states just got some pain medications.  Tolerating current diet.  Objective: Vitals:   04/11/22 0459 04/11/22 0755 04/11/22 1200 04/11/22 1626  BP: (!) 121/47 (!) 107/52 (!) 120/50 128/78  Pulse: 73 70  81  Resp: '16 18  17  '$ Temp: 97.6 F (36.4 C) 97.9 F (36.6 C)  98.6 F (37 C)  TempSrc: Oral Oral  Oral  SpO2: 97% 95%  96%  Weight:      Height:        Intake/Output Summary (Last 24 hours) at 04/11/2022 1902 Last data filed at 04/11/2022 1800 Gross per 24 hour  Intake 2062.45 ml  Output 100 ml  Net 1962.45 ml   Filed Weights   04/08/22 1127  Weight: 98.4 kg    Examination:  General exam: NAD. Respiratory system: Lungs clear to auscultation bilaterally.  No wheezes, no crackles, no rhonchi.  Fair air movement.  Speaking in full sentences.  Cardiovascular system: RRR no murmurs rubs or gallops.  No JVD.  Gastrointestinal system: Abdomen is soft, nontender, nondistended, positive bowel sounds.  No rebound.  No guarding.  Central nervous system: Alert and oriented. No focal neurological deficits. Extremities: Right foot bandaged with postop bandage, wound VAC noted.  Skin: No rashes, lesions or ulcers Psychiatry: Judgement and insight appear normal. Mood & affect appropriate.          Data Reviewed:   CBC: Recent Labs  Lab 04/08/22 1220 04/09/22 0052 04/10/22 0132 04/11/22 0657  WBC 9.4 7.0 6.8 8.4   NEUTROABS 7.3  --  3.6 5.6  HGB 14.2 12.4 12.9 13.7  HCT 43.7 38.5 39.9 41.6  MCV 87.9 88.7 87.1 87.6  PLT 261 250 245 297    Basic Metabolic Panel: Recent Labs  Lab 04/08/22 1220 04/09/22 0052 04/10/22 0132 04/11/22 0657  NA 135 137 136 135  K 4.7 4.7 4.5 4.8  CL 99 100 98 98  CO2 '28 29 31 27  '$ GLUCOSE 402* 173* 204* 249*  BUN '18 18 19 16  '$ CREATININE 1.24* 1.08* 1.09* 0.99  CALCIUM 8.8* 8.4* 8.6* 8.7*    GFR: Estimated Creatinine Clearance: 79.2 mL/min (by C-G formula based on SCr of 0.99 mg/dL).  Liver Function Tests: Recent Labs  Lab 04/08/22 1220  AST 12*  ALT 12  ALKPHOS 96  BILITOT 0.4  PROT 7.2  ALBUMIN 3.2*    CBG: Recent Labs  Lab 04/10/22 1632 04/10/22 1934 04/11/22 0754 04/11/22 1131 04/11/22 1625  GLUCAP 271* 316* 257* 298* 346*     Recent Results (from the past 240 hour(s))  Blood culture (routine x 2)     Status: None (Preliminary result)   Collection Time: 04/08/22 12:20 PM   Specimen: BLOOD  Result Value Ref Range Status   Specimen Description BLOOD LEFT ANTECUBITAL  Final  Special Requests   Final    BOTTLES DRAWN AEROBIC AND ANAEROBIC Blood Culture results may not be optimal due to an inadequate volume of blood received in culture bottles   Culture   Final    NO GROWTH 3 DAYS Performed at [facility], [place] [address], [place], [place] [address]    Report Status PENDING  Incomplete  Blood culture (routine x 2)     Status: None (Preliminary result)   Collection Time: 04/08/22  4:30 PM   Specimen: BLOOD LEFT HAND  Result Value Ref Range Status   Specimen Description BLOOD LEFT HAND  Final   Special Requests   Final    BOTTLES DRAWN AEROBIC AND ANAEROBIC Blood Culture adequate volume   Culture   Final    NO GROWTH 3 DAYS Performed at [facility], [place] [address], [place], [place] [address]    Report Status PENDING  Incomplete  Surgical PCR screen     Status: None   Collection Time: 04/09/22  7:20 PM   Specimen:  Nasal Mucosa; Nasal Swab  Result Value Ref Range Status   MRSA, PCR NEGATIVE NEGATIVE Final   Staphylococcus aureus NEGATIVE NEGATIVE Final    Comment: (NOTE) The Xpert SA Assay (FDA approved for NASAL specimens in patients 82 years of age and older), is one component of a comprehensive surveillance program. It is not intended to diagnose infection nor to guide or monitor treatment. Performed at [facility], [place] [address] [place], [place] [address]   Aerobic/Anaerobic Culture w Gram Stain (surgical/deep wound)     Status: None (Preliminary result)   Collection Time: 04/10/22 10:31 AM   Specimen: Wound  Result Value Ref Range Status   Specimen Description WOUND  Final   Special Requests RIGHT FOOT  Final   Gram Stain   Final    FEW WBC PRESENT, PREDOMINANTLY MONONUCLEAR RARE GRAM POSITIVE COCCI    Culture   Final    RARE STAPHYLOCOCCUS SIMULANS SUSCEPTIBILITIES TO FOLLOW Performed at [facility], [place] [address], [place], [place] [address]    Report Status PENDING  Incomplete         Radiology Studies: No results found.      Scheduled Meds:  bisacodyl  10 mg Oral QHS   docusate sodium  100 mg Oral QHS   enoxaparin (LOVENOX) injection  40 mg Subcutaneous Q24H   ezetimibe  10 mg Oral Daily   FLUoxetine  10 mg Oral Daily   gabapentin  800 mg Oral TID   insulin aspart  0-5 Units Subcutaneous QHS   insulin aspart  0-9 Units Subcutaneous TID WC   insulin aspart  4 Units Subcutaneous TID WC   insulin glargine-yfgn  25 Units Subcutaneous Daily   lamoTRIgine  25 mg Oral BID   levothyroxine  75 mcg Oral QAC breakfast   mupirocin ointment  1 application  Nasal BID   rOPINIRole  0.5 mg Oral Daily   Continuous Infusions:  cefTRIAXone (ROCEPHIN)  IV 2 g (04/11/22 1400)   metronidazole 500 mg (04/11/22 1719)   vancomycin 1,250 mg (04/11/22 1514)     LOS: 3 days    Time spent: 35 minutes.    Irine Seal, MD [facility]   To contact  the attending provider between 7A-7P or the covering provider during after hours 7P-7A, please log into the web site [email or web address] and access using universal [place] password for that web site. If you do not  have the password, please call the hospital operator.  04/11/2022, 7:02 PM

## 2022-04-11 NOTE — Consult Note (Signed)
[facility] Infectious Disease    Date of Admission:  04/08/2022   Total days of inpatient antibiotics: 3        Reason for Consult: Left foot osteomyelitis    Principal Problem:   Osteomyelitis of foot ([place]) Active Problems:   Hypothyroidism   Anxiety disorder   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)   Chronic pain   AKI (acute kidney injury) ([place])   Essential hypertension   Pure hypercholesterolemia   Assessment: 56 YF with DM and chronic right foot wound with Hx of prior I&Ds admitted for acute infection of wound.  # Chronic right foot osteomyelitis SP I&D with Bx bone cortex right foot, application of abx beads. #DM - MRI on 5/12 right foot showed chronic wound at the lateral aspect of the fifth TMT joint, underlying chronic osteomyelitis of the cuboid, fourth, fifth metatarsal bases. -Xray on 6/9, chronic osteomyelitis with 5th metatarsal and cuboid.  -Taken to OR on 6/11 with podiatry for I&D of wound. Found to have necrotic soft tissue which was debrided. Deep tissue Cx sent to micro, Bone Bx sent to pathology. OF note she had a bone Bx of 7/30 that of cuboid and 5 th metatarsal which did not show signs of acute inflammation . MRI on 7/3 had shown acute on chronic OM of cuboid, 4 and 5th metatarsal and bases.  -Denies  Hx of IVDA, she lives in an apartment.  Recommendations: -D/C cefepime -Start ceftriaxone -Continue vancomycin and metronidazole -Follow OR Cx  Microbiology:   Antibiotics: Cefepime 6/09-p Ceftriaxone 6/09 Metronidazole 6/9-p Vancomycin 6/09-p  Cultures: Blood 6/09 NG  OR Cx: pending, GS : rare gpc   HPI: Katherine Herrera is a 56 y.o. female with DM A1c 7.5 on 6/11, history of childhood seizures, chronic pain syndrome, HLD, hypothyroidism, chronic history of right foot told to go to the ED for IV antibiotics and wound care.  She has been seeing wound care there was concern for acute infection.  MRI of left foot on  5/12 noted chronic wound at the lateral aspect fifth TMT joint and underlying chronic osteomyelitis of the cuboid, fourth and fifth metatarsal bases.  Patient states she has not been on prehospitalization antibiotics.  She noted that she has had the right foot chronic wound for about 6 years.  She has noted increased drainage which is malodorous for the last 3 to 4 days.  She denies fever, chills, nausea vomiting diarrhea.  She denies any history of being on long-term antibiotics.  Last course of antibiotics ended 3 weeks ago where she received 2 weeks of doxycycline.  On arrival to the ED vital signs stable.  Taken to the OR podiatry for I&D.   Review of Systems: Review of Systems  All other systems reviewed and are negative.   Past Medical History:  Diagnosis Date   Anxiety    Arthritis    hands   B12 deficiency    Chronic constipation    Chronic pain syndrome    neck, lower back, and foot   COPD (chronic obstructive pulmonary disease) ([place])    Diabetic ulcer of right foot ([place])    Gout    possible   History of seizure    05-26-2020  per pt as child and last one in late teen's   History of sepsis    due to lower extremity cellulits 2019 left , right 03/ 2021   History of syncope  Hyperlipidemia    Hypothyroidism    Impaired range of motion of cervical spine    per pt hx cervical fusion C4 -- 7,  has to be careful about turning her head to the right can cause her to pass out   Insulin dependent type 2 diabetes mellitus [facility])    endocrinologist--- dr Chalmers Cater    (05-26-2020 per pt check's blood sugar twice dialy and at times a third time,  fasting am blood sugar's---- 200 -- 250)   LBP (low back pain)    DR Patrice Paradise   Migraine    Peripheral neuropathy    RBBB (right bundle branch block)    Tenosynovitis of foot and ankle 09/03/2013   TTS (tarsal tunnel syndrome) 2010   Left Foot [place] in [place]   Vitamin D deficiency    Wears glasses     Social History   Tobacco Use   Smoking  status: Every Day    Packs/day: 1.00    Years: 10.00    Total pack years: 10.00    Types: Cigarettes   Smokeless tobacco: Never   Tobacco comments:    Trying to quit, smoking 1 ppd  Vaping Use   Vaping Use: Never used  Substance Use Topics   Alcohol use: No    Alcohol/week: 0.0 standard drinks of alcohol   Drug use: No    Family History  Problem Relation Age of Onset   Diabetes Mother    Hypertension Mother    Cancer Father 51       ? bone cancer   [name] Brother    Scheduled Meds:  bisacodyl  10 mg Oral QHS   docusate sodium  100 mg Oral QHS   enoxaparin (LOVENOX) injection  40 mg Subcutaneous Q24H   ezetimibe  10 mg Oral Daily   FLUoxetine  10 mg Oral Daily   gabapentin  800 mg Oral TID   insulin aspart  0-5 Units Subcutaneous QHS   insulin aspart  0-9 Units Subcutaneous TID WC   insulin aspart  4 Units Subcutaneous TID WC   insulin glargine-yfgn  25 Units Subcutaneous Daily   lamoTRIgine  25 mg Oral BID   levothyroxine  75 mcg Oral QAC breakfast   mupirocin ointment  1 application  Nasal BID   rOPINIRole  0.5 mg Oral Daily   Continuous Infusions:  ceFEPime (MAXIPIME) IV 2 g (04/11/22 0522)   metronidazole 500 mg (04/11/22 0605)   vancomycin     PRN Meds:.acetaminophen **OR** acetaminophen, clonazePAM, loratadine, morphine injection, oxyCODONE Allergies  Allergen Reactions   Cephalexin Nausea And Vomiting   Demerol  [Meperidine Hcl] Rash   Lovastatin Other (See Comments)    Possible myalgia    Metformin And Related Nausea And Vomiting   Sulfamethoxazole-Trimethoprim     Other reaction(s): Unknown DILI, pancreatitis   Crestor [Rosuvastatin Calcium]     myalgia   Hydromorphone     Other reaction(s): Unknown   Niacin Other (See Comments)    Unknown    Other Other (See Comments)   Hydromorphone Hcl Itching    Patient has been tolerating Hydromorphone tablets without adverse effect (07/09/19) Other reaction(s): Unknown   Paroxetine Other (See  Comments)    Unknown     OBJECTIVE: Blood pressure (!) 107/52, pulse 70, temperature 97.9 F (36.6 C), temperature source Oral, resp. rate 18, height '5\' 9"'$  (1.753 m), weight 98.4 kg, SpO2 95 %.  Physical Exam Constitutional:      Appearance: Normal appearance.  HENT:     Head: Normocephalic and atraumatic.     Right Ear: Tympanic membrane normal.     Left Ear: Tympanic membrane normal.     Nose: Nose normal.     Mouth/Throat:     Mouth: Mucous membranes are moist.  Eyes:     Extraocular Movements: Extraocular movements intact.     Conjunctiva/sclera: Conjunctivae normal.     Pupils: Pupils are equal, round, and reactive to light.  Cardiovascular:     Rate and Rhythm: Normal rate and regular rhythm.     Heart sounds: No murmur heard.    No friction rub. No gallop.  Pulmonary:     Effort: Pulmonary effort is normal.     Breath sounds: Normal breath sounds.  Abdominal:     General: Abdomen is flat.     Palpations: Abdomen is soft.  Musculoskeletal:        General: Normal range of motion.  Skin:    General: Skin is warm and dry.     Comments: Right foot wound vac and bandaged.  Neurological:     General: No focal deficit present.     Mental Status: She is alert and oriented to person, place, and time.  Psychiatric:        Mood and Affect: Mood normal.     Lab Results Lab Results  Component Value Date   WBC 8.4 04/11/2022   HGB 13.7 04/11/2022   HCT 41.6 04/11/2022   MCV 87.6 04/11/2022   PLT 263 04/11/2022    Lab Results  Component Value Date   CREATININE 0.99 04/11/2022   BUN 16 04/11/2022   NA 135 04/11/2022   K 4.8 04/11/2022   CL 98 04/11/2022   CO2 27 04/11/2022    Lab Results  Component Value Date   ALT 12 04/08/2022   AST 12 (L) 04/08/2022   ALKPHOS 96 04/08/2022   BILITOT 0.4 04/08/2022       Laurice Record, [facility] 04/11/2022, 11:42 AM

## 2022-04-12 DIAGNOSIS — M869 Osteomyelitis, unspecified: Secondary | ICD-10-CM | POA: Diagnosis not present

## 2022-04-12 DIAGNOSIS — F419 Anxiety disorder, unspecified: Secondary | ICD-10-CM | POA: Diagnosis not present

## 2022-04-12 DIAGNOSIS — N179 Acute kidney failure, unspecified: Secondary | ICD-10-CM | POA: Diagnosis not present

## 2022-04-12 DIAGNOSIS — M86671 Other chronic osteomyelitis, right ankle and foot: Secondary | ICD-10-CM | POA: Diagnosis not present

## 2022-04-12 LAB — GLUCOSE, CAPILLARY
Glucose-Capillary: 152 mg/dL — ABNORMAL HIGH (ref 70–99)
Glucose-Capillary: 178 mg/dL — ABNORMAL HIGH (ref 70–99)
Glucose-Capillary: 241 mg/dL — ABNORMAL HIGH (ref 70–99)
Glucose-Capillary: 282 mg/dL — ABNORMAL HIGH (ref 70–99)

## 2022-04-12 LAB — CBC WITH DIFFERENTIAL/PLATELET
Abs Immature Granulocytes: 0.02 10*3/uL (ref 0.00–0.07)
Basophils Absolute: 0 10*3/uL (ref 0.0–0.1)
Basophils Relative: 0 %
Eosinophils Absolute: 0.2 10*3/uL (ref 0.0–0.5)
Eosinophils Relative: 2 %
HCT: 45.9 % (ref 36.0–46.0)
Hemoglobin: 14.8 g/dL (ref 12.0–15.0)
Immature Granulocytes: 0 %
Lymphocytes Relative: 27 %
Lymphs Abs: 2.4 10*3/uL (ref 0.7–4.0)
MCH: 27.6 pg (ref 26.0–34.0)
MCHC: 32.2 g/dL (ref 30.0–36.0)
MCV: 85.5 fL (ref 80.0–100.0)
Monocytes Absolute: 0.6 10*3/uL (ref 0.1–1.0)
Monocytes Relative: 7 %
Neutro Abs: 5.8 10*3/uL (ref 1.7–7.7)
Neutrophils Relative %: 64 %
Platelets: 277 10*3/uL (ref 150–400)
RBC: 5.37 MIL/uL — ABNORMAL HIGH (ref 3.87–5.11)
RDW: 13.2 % (ref 11.5–15.5)
WBC: 9.1 10*3/uL (ref 4.0–10.5)
nRBC: 0 % (ref 0.0–0.2)

## 2022-04-12 LAB — BASIC METABOLIC PANEL
Anion gap: 10 (ref 5–15)
BUN: 16 mg/dL (ref 6–20)
CO2: 26 mmol/L (ref 22–32)
Calcium: 9.1 mg/dL (ref 8.9–10.3)
Chloride: 99 mmol/L (ref 98–111)
Creatinine, Ser: 1.01 mg/dL — ABNORMAL HIGH (ref 0.44–1.00)
GFR, Estimated: >60 mL/min (ref >60)
Glucose, Bld: 193 mg/dL — ABNORMAL HIGH (ref 70–99)
Potassium: 4.2 mmol/L (ref 3.5–5.1)
Sodium: 135 mmol/L (ref 135–145)

## 2022-04-12 LAB — SURGICAL PATHOLOGY

## 2022-04-12 NOTE — Progress Notes (Signed)
   04/12/22 0700   PT - Assessment/Plan  PT Plan Frequency needs to be updated  PT Frequency (ACUTE ONLY) Min 3X/week   Updated frequency to 3x week as this is appropriate frequency for pts diagnosis and d/c plan.   Oriah Leinweber M,PT Acute Rehab Services [phone]

## 2022-04-12 NOTE — Progress Notes (Signed)
PROGRESS NOTE    Katherine Herrera  MVH:[id number] DOB: May 16, 1966 DOA: 04/08/2022 PCP: Cassandria Anger, MD    Chief Complaint  Patient presents with   Foot Pain    Infection     Brief Narrative:  Patient 56 year old female with history of type 2 diabetes, history of childhood seizures, chronic pain syndrome, hyperlipidemia, hypothyroidism, chronic osteomyelitis of the right foot who was sent to the ED from wound care center for IV antibiotics due to worsening right foot wound, concern for worsening osteomyelitis.  Patient initially placed on IV antibiotics.  Admitted.  Podiatry consulted.    Assessment & Plan:  Principal Problem:   Osteomyelitis of foot ([place]) Active Problems:   AKI (acute kidney injury) ([place])   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)   Essential hypertension   Hypothyroidism   Pure hypercholesterolemia   Anxiety disorder   Chronic pain    Assessment and Plan: * Osteomyelitis of foot ([place]) 56 year old female with known chronic osteomyelitis of right 5th metatarsal in setting of T2DM who presented to ED for worsening wound and IV abx from wound care  -blood cultures pending, lactic acid 1.1  -no criteria for sepsis -Patient started empirically on IV vancomycin, IV cefepime, IV Flagyl.   -ABIs checked in 12/2021 and wnl  -MRI in 02/2022: Chronic wound at the lateral aspect of the fifth TMT joint with underlying chronic osteomyelitis of the cuboid and fourth and fifth metatarsal bases. No abscess. -podiatry consulted-Dr. Sherryle Lis assessed the patient and patient went to the OR had cultures of biopsies done in addition to irrigation and debridement as well as implantation of antibiotic beads and wound VAC  04/10/2022.   -IV antibiotics were initially held 04/09/2022 until biopsies and cultures were obtained which was done.  -Cultures showing Staphylococcus simulans. -Biopsy with no malignancy identified, gangrenous necrosis of skin and subcutis  with focal acute inflammation and surface bacterial overgrowth. -Patient with staged procedure, per podiatry will return later this week for soft tissue reconstruction. -Resumed IV cefepime and IV vancomycin postoperatively.   -Continue IV Flagyl.   -ID consulted and IV cefepime has been discontinued and patient placed on IV Rocephin. -Continue IV Flagyl, IV vancomycin per ID recommendations. -ID and podiatry following and I appreciate the input and recommendations.    AKI (acute kidney injury) ([place]) Renal function fluctuates. AKI vs. Baseline CKD Bolus in ED and patient placed on IV fluids.   -Renal function improved. Strict I/O Hold nephrotoxic drugs Continue to hold baclofen and lisinopril.   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) A1C in 10/2021 was 7.7 -Repeat hemoglobin A1c 7.5 (04/10/2022). -Patient noted to be hypoglycemic on admission likely secondary to acute infection, no signs of DKA. Osteomyelitis likely driving sugar uP. -Patient on 70/30 which has been discontinued and patient initially placed on long-acting insulin of Semglee 20 units in addition to SSI.  -Once no further procedures anticipated we will place back on home regimen 70/30.  -CBG 152 this morning. -Increase Semglee to 30 units daily. -Increase NovoLog meal coverage to 6 units 3 times daily with meals. -SSI.  Essential hypertension -Blood pressure currently stable.   -Lisinopril on hold secondary to AKI.   -Follow.  Hypothyroidism TSH wnl in 08/2021 Continue home synthroid   Pure hypercholesterolemia Continue zetia. Statin intolerance.  Recent LDL of 135   Anxiety disorder Continue prozac and klonopin   Chronic pain Continue home oxycodone.  pmp website reviewed and fills correctly   COPD (  chronic obstructive pulmonary disease) (HCC)-resolved as of 04/08/2022 No signs of exacerbation          DVT prophylaxis: Lovenox Code Status: Full Family Communication: Updated  patient.  No family at bedside. Disposition: SNF versus home with home health when cleared by podiatry.   Status is: Inpatient Remains inpatient appropriate because: Severity of illness.   Consultants:  Podiatry: Dr. Sherryle Lis 04/09/2022 ID: Dr. Candiss Norse 04/11/2022  Procedures:  Plain films right foot 04/08/2022 Irrigation and debridement right foot/application of wound VAC/placement of antibiotic beads per Dr. Sherryle Lis, podiatry 04/10/2022.  Antimicrobials:  IV cefepime 04/08/2022>>>> 04/09/2022 IV Flagyl 04/08/2022>>>>  IV vancomycin 04/08/2022>>>> 04/09/2022 IV vancomycin 04/10/2022>>>> IV cefepime 04/10/2022>>>>>> 04/11/2022 IV Rocephin 04/11/2022>>>>>   Subjective: Patient laying in bed.  No chest pain.  No shortness of breath.  Some complaints of pain in the right foot.  Objective: Vitals:   04/11/22 2118 04/12/22 0539 04/12/22 0815 04/12/22 1538  BP: (!) 152/86 117/60 (!) 121/55 128/61  Pulse: 84 70 69 83  Resp: '17 16 17 17  '$ Temp: 98.2 F (36.8 C) 98.2 F (36.8 C) 97.7 F (36.5 C) 97.9 F (36.6 C)  TempSrc: Oral  Oral Oral  SpO2: 99% 93% 98% 93%  Weight:      Height:        Intake/Output Summary (Last 24 hours) at 04/12/2022 1636 Last data filed at 04/12/2022 1541 Gross per 24 hour  Intake 240 ml  Output 0 ml  Net 240 ml   Filed Weights   04/08/22 1127  Weight: 98.4 kg    Examination:  General exam: NAD. Respiratory system: CTA B.  No wheezes, no crackles, no rhonchi.  Fair air movement.  Speaking in full sentences. Cardiovascular system: Regular rate rhythm no murmurs rubs or gallops.  No JVD.  No lower extremity edema.  Gastrointestinal system: Abdomen is soft, nontender, nondistended, positive bowel sounds.  No rebound.  No guarding.  Central nervous system: Alert and oriented. No focal neurological deficits. Extremities: Right foot bandaged with postop bandage, wound VAC noted.  Skin: No rashes, lesions or ulcers Psychiatry: Judgement and insight appear normal.  Mood & affect appropriate.          Data Reviewed:   CBC: Recent Labs  Lab 04/08/22 1220 04/09/22 0052 04/10/22 0132 04/11/22 0657 04/12/22 0037  WBC 9.4 7.0 6.8 8.4 9.1  NEUTROABS 7.3  --  3.6 5.6 5.8  HGB 14.2 12.4 12.9 13.7 14.8  HCT 43.7 38.5 39.9 41.6 45.9  MCV 87.9 88.7 87.1 87.6 85.5  PLT 261 250 245 263 010    Basic Metabolic Panel: Recent Labs  Lab 04/08/22 1220 04/09/22 0052 04/10/22 0132 04/11/22 0657 04/12/22 0037  NA 135 137 136 135 135  K 4.7 4.7 4.5 4.8 4.2  CL 99 100 98 98 99  CO2 '28 29 31 27 26  '$ GLUCOSE 402* 173* 204* 249* 193*  BUN '18 18 19 16 16  '$ CREATININE 1.24* 1.08* 1.09* 0.99 1.01*  CALCIUM 8.8* 8.4* 8.6* 8.7* 9.1    GFR: Estimated Creatinine Clearance: 77.7 mL/min (A) (by C-G formula based on SCr of 1.01 mg/dL (H)).  Liver Function Tests: Recent Labs  Lab 04/08/22 1220  AST 12*  ALT 12  ALKPHOS 96  BILITOT 0.4  PROT 7.2  ALBUMIN 3.2*    CBG: Recent Labs  Lab 04/11/22 1625 04/11/22 2119 04/12/22 0816 04/12/22 1222 04/12/22 1539  GLUCAP 346* 252* 152* 178* 241*     Recent Results (from the past 240 hour(s))  Blood culture (routine x 2)     Status: None (Preliminary result)   Collection Time: 04/08/22 12:20 PM   Specimen: BLOOD  Result Value Ref Range Status   Specimen Description BLOOD LEFT ANTECUBITAL  Final   Special Requests   Final    BOTTLES DRAWN AEROBIC AND ANAEROBIC Blood Culture results may not be optimal due to an inadequate volume of blood received in culture bottles   Culture   Final    NO GROWTH 4 DAYS Performed at [facility], [place] [address], [place], [place] [address]    Report Status PENDING  Incomplete  Blood culture (routine x 2)     Status: None (Preliminary result)   Collection Time: 04/08/22  4:30 PM   Specimen: BLOOD LEFT HAND  Result Value Ref Range Status   Specimen Description BLOOD LEFT HAND  Final   Special Requests   Final    BOTTLES DRAWN AEROBIC AND ANAEROBIC Blood  Culture adequate volume   Culture   Final    NO GROWTH 4 DAYS Performed at [facility], [place] [address], [place], [place] [address]    Report Status PENDING  Incomplete  Surgical PCR screen     Status: None   Collection Time: 04/09/22  7:20 PM   Specimen: Nasal Mucosa; Nasal Swab  Result Value Ref Range Status   MRSA, PCR NEGATIVE NEGATIVE Final   Staphylococcus aureus NEGATIVE NEGATIVE Final    Comment: (NOTE) The Xpert SA Assay (FDA approved for NASAL specimens in patients 34 years of age and older), is one component of a comprehensive surveillance program. It is not intended to diagnose infection nor to guide or monitor treatment. Performed at [facility], [place] [address], [place], [place] [address]   Aerobic/Anaerobic Culture w Gram Stain (surgical/deep wound)     Status: None (Preliminary result)   Collection Time: 04/10/22 10:31 AM   Specimen: Wound  Result Value Ref Range Status   Specimen Description WOUND  Final   Special Requests RIGHT FOOT  Final   Gram Stain   Final    FEW WBC PRESENT, PREDOMINANTLY MONONUCLEAR RARE GRAM POSITIVE COCCI Performed at [facility], [address], [place], [place] [address]    Culture   Final    RARE STAPHYLOCOCCUS SIMULANS NO ANAEROBES ISOLATED; CULTURE IN PROGRESS FOR 5 DAYS    Report Status PENDING  Incomplete   Organism ID, Bacteria STAPHYLOCOCCUS SIMULANS  Final      Susceptibility   Staphylococcus simulans - MIC*    CIPROFLOXACIN <=0.5 SENSITIVE Sensitive     ERYTHROMYCIN >=8 RESISTANT Resistant     GENTAMICIN <=0.5 SENSITIVE Sensitive     OXACILLIN <=0.25 SENSITIVE Sensitive     TETRACYCLINE >=16 RESISTANT Resistant     VANCOMYCIN <=0.5 SENSITIVE Sensitive     TRIMETH/SULFA <=10 SENSITIVE Sensitive     CLINDAMYCIN >=8 RESISTANT Resistant     RIFAMPIN <=0.5 SENSITIVE Sensitive     Inducible Clindamycin NEGATIVE Sensitive     * RARE STAPHYLOCOCCUS SIMULANS  Culture, fungus without smear     Status:  None (Preliminary result)   Collection Time: 04/10/22 10:31 AM   Specimen: Tissue; Other  Result Value Ref Range Status   Specimen Description TISSUE  Final   Special Requests NONE  Final   Culture   Final    NO FUNGUS ISOLATED AFTER 1 DAY Performed at [facility], [address], [place], [place] [address]  Report Status PENDING  Incomplete         Radiology Studies: No results found.      Scheduled Meds:  bisacodyl  10 mg Oral QHS   docusate sodium  100 mg Oral QHS   enoxaparin (LOVENOX) injection  40 mg Subcutaneous Q24H   ezetimibe  10 mg Oral Daily   FLUoxetine  10 mg Oral Daily   gabapentin  800 mg Oral TID   insulin aspart  0-5 Units Subcutaneous QHS   insulin aspart  0-9 Units Subcutaneous TID WC   insulin aspart  6 Units Subcutaneous TID WC   insulin glargine-yfgn  30 Units Subcutaneous Daily   lamoTRIgine  25 mg Oral BID   levothyroxine  75 mcg Oral QAC breakfast   mupirocin ointment  1 application  Nasal BID   rOPINIRole  0.5 mg Oral Daily   Continuous Infusions:  cefTRIAXone (ROCEPHIN)  IV 2 g (04/12/22 1335)   metronidazole 500 mg (04/12/22 0638)     LOS: 4 days    Time spent: 35 minutes.    Irine Seal, MD [facility]   To contact the attending provider between 7A-7P or the covering provider during after hours 7P-7A, please log into the web site [email or web address] and access using universal [place] password for that web site. If you do not have the password, please call the hospital operator.  04/12/2022, 4:36 PM

## 2022-04-12 NOTE — Progress Notes (Addendum)
Katherine Herrera, Katherine Herrera ([id number]) Visit Report for 04/07/2022 Allergy List Details Patient Name: Date of Service: [facility] [place], [place] Katherine Herrera. 04/07/2022 8:30 A Herrera Medical Record Number: [id number] Patient Account Number: [id number] Date of Birth/Sex: Treating RN: Jun 01, 1966 (56 y.o. Tonita Phoenix, Lauren Primary Care Dominique Calvey: Cassandria Anger Other Clinician: Referring Maat Kafer: Treating Adis Sturgill/Extender: Sharma Covert Weeks in Treatment: 0 Allergies Active Allergies Sulfa (Sulfonamide Antibiotics) Reaction: hives Severity: Severe Allergy Notes Electronic Signature(s) Signed: 04/12/2022 8:46:10 AM By: Erenest Blank Entered By: Erenest Blank on 04/07/2022 08:43:19 -------------------------------------------------------------------------------- Arrival Information Details Patient Name: Date of Service: [facility], DO Katherine Herrera. 04/07/2022 8:30 A Herrera Medical Record Number: [id number] Patient Account Number: [id number] Date of Birth/Sex: Treating RN: [birth year] (56 y.o. Tonita Phoenix, Lauren Primary Care Brileigh Sevcik: Cassandria Anger Other Clinician: Referring Alverna Fawley: Treating Eoghan Belcher/Extender: Thayer Headings in Treatment: 0 Visit Information Patient Arrived: Ambulatory Arrival Time: 08:27 Accompanied By: self Transfer Assistance: None Patient Identification Verified: Yes Secondary Verification Process Completed: Yes Patient Requires Transmission-Based Precautions: No Patient Has Alerts: No Electronic Signature(s) Signed: 04/12/2022 8:46:10 AM By: Erenest Blank Entered By: Erenest Blank on 04/07/2022 08:39:07 -------------------------------------------------------------------------------- Clinic Level of Care Assessment Details Patient Name: Date of Service: [facility], DO [place] Herrera. 04/07/2022 8:30 A Herrera Medical Record Number: [id number] Patient Account Number: [id number] Date of Birth/Sex: Treating RN: 08-13-1966 (56 y.o. Tonita Phoenix,  Lauren Primary Care Clorene Nerio: Cassandria Anger Other Clinician: Referring Dawnya Grams: Treating Baylin Cabal/Extender: Thayer Headings in Treatment: 0 Clinic Level of Care Assessment Items TOOL 4 Quantity Score X- 1 0 Use when only an EandM is performed on FOLLOW-UP visit ASSESSMENTS - Nursing Assessment / Reassessment X- 1 10 Reassessment of Co-morbidities (includes updates in patient status) X- 1 5 Reassessment of Adherence to Treatment Plan ASSESSMENTS - Wound and Skin A ssessment / Reassessment X - Simple Wound Assessment / Reassessment - one wound 1 5 '[]'$  - 0 Complex Wound Assessment / Reassessment - multiple wounds '[]'$  - 0 Dermatologic / Skin Assessment (not related to wound area) ASSESSMENTS - Focused Assessment X- 1 5 Circumferential Edema Measurements - multi extremities '[]'$  - 0 Nutritional Assessment / Counseling / Intervention '[]'$  - 0 Lower Extremity Assessment (monofilament, tuning fork, pulses) '[]'$  - 0 Peripheral Arterial Disease Assessment (using hand held doppler) ASSESSMENTS - Ostomy and/or Continence Assessment and Care '[]'$  - 0 Incontinence Assessment and Management '[]'$  - 0 Ostomy Care Assessment and Management (repouching, etc.) PROCESS - Coordination of Care '[]'$  - 0 Simple Patient / Family Education for ongoing care X- 1 20 Complex (extensive) Patient / Family Education for ongoing care X- 1 10 Staff obtains Programmer, systems, Records, T Results / Process Orders est X- 1 10 Staff telephones HHA, Nursing Homes / Clarify orders / etc '[]'$  - 0 Routine Transfer to another Facility (non-emergent condition) '[]'$  - 0 Routine Hospital Admission (non-emergent condition) '[]'$  - 0 New Admissions / Biomedical engineer / Ordering NPWT Apligraf, etc. , X- 1 20 Emergency Hospital Admission (emergent condition) '[]'$  - 0 Simple Discharge Coordination X- 1 15 Complex (extensive) Discharge Coordination PROCESS - Special Needs '[]'$  - 0 Pediatric / Minor  Patient Management '[]'$  - 0 Isolation Patient Management '[]'$  - 0 Hearing / Language / Visual special needs '[]'$  - 0 Assessment of Community assistance (transportation, D/C planning, etc.) '[]'$  - 0 Additional assistance / Altered mentation '[]'$  - 0 Support Surface(s) Assessment (bed, cushion, seat, etc.) INTERVENTIONS - Wound Cleansing / Measurement '[]'$  - 0 Simple  Wound Cleansing - one wound X- 1 5 Complex Wound Cleansing - multiple wounds X- 1 5 Wound Imaging (photographs - any number of wounds) '[]'$  - 0 Wound Tracing (instead of photographs) X- 1 5 Simple Wound Measurement - one wound '[]'$  - 0 Complex Wound Measurement - multiple wounds INTERVENTIONS - Wound Dressings '[]'$  - 0 Small Wound Dressing one or multiple wounds X- 1 15 Medium Wound Dressing one or multiple wounds '[]'$  - 0 Large Wound Dressing one or multiple wounds X- 1 5 Application of Medications - topical '[]'$  - 0 Application of Medications - injection INTERVENTIONS - Miscellaneous '[]'$  - 0 External ear exam '[]'$  - 0 Specimen Collection (cultures, biopsies, blood, body fluids, etc.) '[]'$  - 0 Specimen(s) / Culture(s) sent or taken to Lab for analysis '[]'$  - 0 Patient Transfer (multiple staff / Civil Service fast streamer / Similar devices) '[]'$  - 0 Simple Staple / Suture removal (25 or less) '[]'$  - 0 Complex Staple / Suture removal (26 or more) '[]'$  - 0 Hypo / Hyperglycemic Management (close monitor of Blood Glucose) '[]'$  - 0 Ankle / Brachial Index (ABI) - do not check if billed separately X- 1 5 Vital Signs Has the patient been seen at the hospital within the last three years: Yes Total Score: 140 Level Of Care: New/Established - Level 4 Electronic Signature(s) Signed: 04/07/2022 4:55:17 PM By: Rhae Hammock RN Entered By: Rhae Hammock on 04/07/2022 16:49:29 -------------------------------------------------------------------------------- Lower Extremity Assessment Details Patient Name: Date of Service: [facility], DO Katherine Herrera. 04/07/2022 8:30  A Herrera Medical Record Number: [id number] Patient Account Number: [id number] Date of Birth/Sex: Treating RN: 12-25-1965 (56 y.o. Tonita Phoenix, Lauren Primary Care Eliannah Hinde: Cassandria Anger Other Clinician: Referring Glenda Kunst: Treating Tomisha Reppucci/Extender: Thayer Headings in Treatment: 0 Edema Assessment Assessed: Shirlyn Goltz: No] [Right: Yes] Edema: [Left: Ye] [Right: s] Calf Left: Right: Point of Measurement: From Medial Instep 34 cm Ankle Left: Right: Point of Measurement: From Medial Instep 29 cm Vascular Assessment Pulses: Dorsalis Pedis Palpable: [Right:Yes] Posterior Tibial Palpable: [Right:Yes] Electronic Signature(s) Signed: 04/07/2022 4:55:17 PM By: Rhae Hammock RN Entered By: Rhae Hammock on 04/07/2022 09:59:18 -------------------------------------------------------------------------------- Multi Wound Chart Details Patient Name: Date of Service: [facility], DO Katherine Herrera. 04/07/2022 8:30 A Herrera Medical Record Number: [id number] Patient Account Number: [id number] Date of Birth/Sex: Treating RN: 21-Apr-1966 (56 y.o. Tonita Phoenix, Lauren Primary Care Mckade Gurka: Cassandria Anger Other Clinician: Referring Naydeline Morace: Treating Raynelle Fujikawa/Extender: Thayer Headings in Treatment: 0 Vital Signs Height(in): 23 Capillary Blood Glucose(mg/dl): 141 Weight(lbs): 217 Pulse(bpm): 32 Body Mass Index(BMI): 76 Blood Pressure(mmHg): 105/70 Temperature(F): 98.4 Respiratory Rate(breaths/min): 18 Photos: [1:No Photos Right, Lateral Foot] [N/A:N/A N/A] Wound Location: [1:Gradually Appeared] [N/A:N/A] Wounding Event: [1:Diabetic Wound/Ulcer of the Lower] [N/A:N/A] Primary Etiology: [1:Extremity Chronic Obstructive Pulmonary] [N/A:N/A] Comorbid History: [1:Disease (COPD), Hypertension, Type II Diabetes, Gout, Osteomyelitis, Neuropathy, Seizure Disorder 04/07/2016] [N/A:N/A] Date Acquired: [1:0] [N/A:N/A] Weeks of Treatment: [1:Open]  [N/A:N/A] Wound Status: [1:No] [N/A:N/A] Wound Recurrence: [1:Yes] [N/A:N/A] Pending A mputation on Presentation: [1:5.4x6.5x2] [N/A:N/A] Measurements L x W x D (cm) [1:27.567] [N/A:N/A] A (cm) : rea [1:55.135] [N/A:N/A] Volume (cm) : [1:0.00%] [N/A:N/A] % Reduction in A rea: [1:0.00%] [N/A:N/A] % Reduction in Volume: [1:12] Starting Position 1 (o'clock): [1:12] Ending Position 1 (o'clock): [1:2.5] Maximum Distance 1 (cm): [1:Yes] [N/A:N/A] Undermining: [1:Grade 3] [N/A:N/A] Classification: [1:Large] [N/A:N/A] Exudate A mount: [1:Purulent] [N/A:N/A] Exudate Type: [1:yellow, brown, green] [N/A:N/A] Exudate Color: [1:Yes] [N/A:N/A] Foul Odor A Cleansing: [1:fter No] [N/A:N/A] Odor A nticipated Due to Product  Use: [1:Distinct, outline attached] [N/A:N/A] Wound Margin: [1:None Present (0%)] [N/A:N/A] Granulation A mount: [1:Large (67-100%)] [N/A:N/A] Necrotic A mount: [1:Eschar, Adherent Slough] [N/A:N/A] Necrotic Tissue: [1:Fat Layer (Subcutaneous Tissue): Yes N/A] Exposed Structures: [1:Tendon: Yes Muscle: Yes Bone: Yes Fascia: No Joint: No None] [N/A:N/A] Treatment Notes Electronic Signature(s) Signed: 04/07/2022 4:23:09 PM By: Linton Ham MD Signed: 04/07/2022 4:55:17 PM By: Rhae Hammock RN Entered By: Linton Ham on 04/07/2022 10:12:22 -------------------------------------------------------------------------------- Multi-Disciplinary Care Plan Details Patient Name: Date of Service: [facility], DO Katherine Herrera. 04/07/2022 8:30 A Herrera Medical Record Number: [id number] Patient Account Number: [id number] Date of Birth/Sex: Treating RN: 07-Mar-1966 (56 y.o. Tonita Phoenix, Lauren Primary Care Malonie Tatum: Cassandria Anger Other Clinician: Referring Meriel Kelliher: Treating Kimiah Hibner/Extender: Thayer Headings in Treatment: 0 Active Inactive Electronic Signature(s) Signed: 06/16/2022 3:32:00 PM By: Deon Pilling RN, BSN Signed: 07/29/2022 11:54:05 AM By:  Rhae Hammock RN Previous Signature: 04/07/2022 4:55:17 PM Version By: Rhae Hammock RN Entered By: Deon Pilling on 06/16/2022 15:32:00 -------------------------------------------------------------------------------- Pain Assessment Details Patient Name: Date of Service: [facility], DO Katherine Herrera. 04/07/2022 8:30 A Herrera Medical Record Number: [id number] Patient Account Number: [id number] Date of Birth/Sex: Treating RN: Aug 31, 1966 (56 y.o. Tonita Phoenix, Lauren Primary Care Cletis Muma: Cassandria Anger Other Clinician: Referring Sacha Topor: Treating Klara Stjames/Extender: Thayer Headings in Treatment: 0 Active Problems Location of Pain Severity and Description of Pain Patient Has Paino No Site Locations Pain Management and Medication Current Pain Management: Electronic Signature(s) Signed: 04/07/2022 4:55:17 PM By: Rhae Hammock RN Entered By: Rhae Hammock on 04/07/2022 09:59:30 -------------------------------------------------------------------------------- Patient/Caregiver Education Details Patient Name: Date of Service: Lillian Herrera. [facility], DO Katherine Herrera. 6/8/2023andnbsp8:30 [place] Record Number: [id number] Patient Account Number: [id number] Date of Birth/Gender: Treating RN: 1966/03/13 (56 y.o. Tonita Phoenix, Lauren Primary Care Physician: Cassandria Anger Other Clinician: Referring Physician: Treating Physician/Extender: Thayer Headings in Treatment: 0 Education Assessment Education Provided To: Patient Education Topics Provided Welcome T [facility] [place]: o Methods: Explain/Verbal Responses: Reinforcements needed Electronic Signature(s) Signed: 04/07/2022 4:55:17 PM By: Rhae Hammock RN Entered By: Rhae Hammock on 04/07/2022 16:41:31 -------------------------------------------------------------------------------- Wound Assessment Details Patient Name: Date of Service: [facility], DO Katherine Herrera. 04/07/2022 8:30  A Herrera Medical Record Number: [id number] Patient Account Number: [id number] Date of Birth/Sex: Treating RN: [birth year] (56 y.o. Tonita Phoenix, Lauren Primary Care Jenee Spaugh: Cassandria Anger Other Clinician: Referring Jlyn Bracamonte: Treating Zoya Sprecher/Extender: Thayer Headings in Treatment: 0 Wound Status Wound Number: 1 Primary Diabetic Wound/Ulcer of the Lower Extremity Etiology: Wound Location: Right, Lateral Foot Wound Open Wounding Event: Gradually Appeared Status: Date Acquired: 04/07/2016 Comorbid Chronic Obstructive Pulmonary Disease (COPD), Hypertension, Weeks Of Treatment: 0 History: Type II Diabetes, Gout, Osteomyelitis, Neuropathy, Seizure Clustered Wound: No Disorder Pending Amputation On Presentation Wound Measurements Length: (cm) 5.4 Width: (cm) 6.5 Depth: (cm) 2 Area: (cm) 27.567 Volume: (cm) 55.135 % Reduction in Area: 0% % Reduction in Volume: 0% Epithelialization: None Tunneling: No Undermining: Yes Starting Position (o'clock): 12 Ending Position (o'clock): 12 Maximum Distance: (cm) 2.5 Wound Description Classification: Grade 3 Wound Margin: Distinct, outline attached Exudate Amount: Large Exudate Type: Purulent Exudate Color: yellow, brown, green Foul Odor After Cleansing: Yes Due to Product Use: No Slough/Fibrino Yes Wound Bed Granulation Amount: None Present (0%) Exposed Structure Necrotic Amount: Large (67-100%) Fascia Exposed: No Necrotic Quality: Eschar, Adherent Slough Fat Layer (Subcutaneous Tissue) Exposed: Yes Tendon Exposed: Yes Muscle Exposed: Yes Necrosis of Muscle: No Joint Exposed: No  Bone Exposed: Yes Electronic Signature(s) Signed: 04/07/2022 4:55:17 PM By: Rhae Hammock RN Entered By: Rhae Hammock on 04/07/2022 09:57:23 -------------------------------------------------------------------------------- Vitals Details Patient Name: Date of Service: [facility], DO Katherine Herrera. 04/07/2022 8:30 A  Herrera Medical Record Number: [id number] Patient Account Number: [id number] Date of Birth/Sex: Treating RN: 1966-04-17 (56 y.o. Tonita Phoenix, Lauren Primary Care Fordyce Lepak: Cassandria Anger Other Clinician: Referring Serigne Kubicek: Treating Roney Youtz/Extender: Thayer Headings in Treatment: 0 Vital Signs Time Taken: 08:41 Temperature (F): 98.4 Height (in): 69 Pulse (bpm): 97 Source: Stated Respiratory Rate (breaths/min): 18 Weight (lbs): 217 Blood Pressure (mmHg): 105/70 Source: Stated Capillary Blood Glucose (mg/dl): 141 Body Mass Index (BMI): 32 Reference Range: 80 - 120 mg / dl Electronic Signature(s) Signed: 04/12/2022 8:46:10 AM By: Erenest Blank Entered By: Erenest Blank on 04/07/2022 08:43:14

## 2022-04-12 NOTE — Progress Notes (Signed)
LETTI, TOWELL ([id number]) Visit Report for 04/07/2022 Abuse Risk Screen Details Patient Name: Date of Service: [place], [place] NNA M. 04/07/2022 8:30 A M Medical Record Number: [id number] Patient Account Number: [id number] Date of Birth/Sex: Treating RN: 04-04-1966 (56 y.o. Tonita Phoenix, Lauren Primary Care Demetrias Goodbar: Cassandria Anger Other Clinician: Referring Marielouise Amey: Treating Orbie Grupe/Extender: Thayer Headings in Treatment: 0 Abuse Risk Screen Items Answer ABUSE RISK SCREEN: Has anyone close to you tried to hurt or harm you recentlyo No Do you feel uncomfortable with anyone in your familyo No Has anyone forced you do things that you didnt want to doo No Electronic Signature(s) Signed: 04/07/2022 4:55:17 PM By: Rhae Hammock RN Signed: 04/12/2022 8:46:10 AM By: Erenest Blank Entered By: Erenest Blank on 04/07/2022 09:05:11 -------------------------------------------------------------------------------- Activities of Daily Living Details Patient Name: Date of Service: [facility], DO [place]. 04/07/2022 8:30 A M Medical Record Number: [id number] Patient Account Number: [id number] Date of Birth/Sex: Treating RN: June 16, 1966 (56 y.o. Tonita Phoenix, Lauren Primary Care Traxton Kolenda: Cassandria Anger Other Clinician: Referring Seamus Warehime: Treating Massiah Minjares/Extender: Thayer Headings in Treatment: 0 Activities of Daily Living Items Answer Activities of Daily Living (Please select one for each item) Drive Automobile Not Able T Medications ake Completely Able Use T elephone Completely Able Care for Appearance Completely Able Use T oilet Completely Able Bath / Shower Completely Able Dress Self Completely Able Feed Self Completely Able Walk Completely Able Get In / Out Bed Completely Able Housework Completely Able Prepare Meals Completely [place] for Self Completely Able Electronic  Signature(s) Signed: 04/07/2022 4:55:17 PM By: Rhae Hammock RN Signed: 04/12/2022 8:46:10 AM By: Erenest Blank Entered By: Erenest Blank on 04/07/2022 09:06:34 -------------------------------------------------------------------------------- Education Screening Details Patient Name: Date of Service: [facility], DO NNA M. 04/07/2022 8:30 A M Medical Record Number: [id number] Patient Account Number: [id number] Date of Birth/Sex: Treating RN: Mar 25, 1966 (56 y.o. Tonita Phoenix, Lauren Primary Care Renn Stille: Cassandria Anger Other Clinician: Referring Anjolie Majer: Treating Dallana Mavity/Extender: Thayer Headings in Treatment: 0 Learning Preferences/Education Level/Primary Language Learning Preference: Explanation, Demonstration, Printed Material Highest Education Level: High School Preferred Language: English Cognitive Barrier Language Barrier: No Translator Needed: No Memory Deficit: No Emotional Barrier: No Cultural/Religious Beliefs Affecting Medical Care: No Physical Barrier Impaired Vision: Yes Glasses Impaired Hearing: No Decreased Hand dexterity: No Knowledge/Comprehension Knowledge Level: High Comprehension Level: High Ability to understand written instructions: High Ability to understand verbal instructions: High Motivation Anxiety Level: Calm Cooperation: Cooperative Education Importance: Acknowledges Need Interest in Health Problems: Asks Questions Perception: Coherent Willingness to Engage in Self-Management High Activities: Readiness to Engage in Self-Management High Activities: Electronic Signature(s) Signed: 04/07/2022 4:55:17 PM By: Rhae Hammock RN Signed: 04/12/2022 8:46:10 AM By: Erenest Blank Entered By: Erenest Blank on 04/07/2022 09:07:31 -------------------------------------------------------------------------------- Fall Risk Assessment Details Patient Name: Date of Service: [facility], DO NNA M. 04/07/2022 8:30 A  M Medical Record Number: [id number] Patient Account Number: [id number] Date of Birth/Sex: Treating RN: 23-Mar-1966 (56 y.o. Tonita Phoenix, Lauren Primary Care Zandon Talton: Cassandria Anger Other Clinician: Referring Charleen Madera: Treating Gabrella Stroh/Extender: Thayer Headings in Treatment: 0 Fall Risk Assessment Items Have you had 2 or more falls in the last 12 monthso 0 Yes Have you had any fall that resulted in injury in the last 12 monthso 0 No FALLS RISK SCREEN History of falling - immediate or within 3 months 25 Yes Secondary diagnosis (Do you have 2  or more medical diagnoseso) 0 No Ambulatory aid None/bed rest/wheelchair/nurse 0 No Crutches/cane/walker 15 Yes Furniture 30 Yes Intravenous therapy Access/Saline/Heparin Lock 0 No Gait/Transferring Normal/ bed rest/ wheelchair 0 Yes Weak (short steps with or without shuffle, stooped but able to lift head while walking, may seek 0 No support from furniture) Impaired (short steps with shuffle, may have difficulty arising from chair, head down, impaired 0 No balance) Mental Status Oriented to own ability 0 No Electronic Signature(s) Signed: 04/07/2022 4:55:17 PM By: Rhae Hammock RN Signed: 04/12/2022 8:46:10 AM By: Erenest Blank Entered By: Erenest Blank on 04/07/2022 09:08:55 -------------------------------------------------------------------------------- Foot Assessment Details Patient Name: Date of Service: [facility], DO [place]. 04/07/2022 8:30 A M Medical Record Number: [id number] Patient Account Number: [id number] Date of Birth/Sex: Treating RN: April 10, 1966 (56 y.o. Tonita Phoenix, Lauren Primary Care Navia Lindahl: Cassandria Anger Other Clinician: Referring Dontae Minerva: Treating Anton Cheramie/Extender: Thayer Headings in Treatment: 0 Foot Assessment Items Site Locations + = Sensation present, - = Sensation absent, C = Callus, U = Ulcer R = Redness, W = Warmth, M = Maceration, PU  = Pre-ulcerative lesion F = Fissure, S = Swelling, D = Dryness Assessment Right: Left: Other Deformity: No No Prior Foot Ulcer: No No Prior Amputation: No No Charcot Joint: No No Ambulatory Status: Ambulatory Without Help Gait: Steady Electronic Signature(s) Signed: 04/07/2022 4:55:17 PM By: Rhae Hammock RN Signed: 04/12/2022 8:46:10 AM By: Erenest Blank Entered By: Erenest Blank on 04/07/2022 09:25:15 -------------------------------------------------------------------------------- Nutrition Risk Screening Details Patient Name: Date of Service: [facility], DO NNA M. 04/07/2022 8:30 A M Medical Record Number: [id number] Patient Account Number: [id number] Date of Birth/Sex: Treating RN: 1966-03-13 (56 y.o. Tonita Phoenix, Lauren Primary Care Dekari Bures: Cassandria Anger Other Clinician: Referring Tasheema Perrone: Treating Arik Husmann/Extender: Thayer Headings in Treatment: 0 Height (in): 69 Weight (lbs): 217 Body Mass Index (BMI): 32 Nutrition Risk Screening Items Score Screening NUTRITION RISK SCREEN: I have an illness or condition that made me change the kind and/or amount of food I eat 0 No I eat fewer than two meals per day 0 No I eat few fruits and vegetables, or milk products 0 No I have three or more drinks of beer, liquor or wine almost every day 0 No I have tooth or mouth problems that make it hard for me to eat 0 No I don't always have enough money to buy the food I need 0 No I eat alone most of the time 0 No I take three or more different prescribed or over-the-counter drugs a day 1 Yes Without wanting to, I have lost or gained 10 pounds in the last six months 0 No I am not always physically able to shop, cook and/or feed myself 0 No Nutrition Protocols Good Risk Protocol 0 No interventions needed Moderate Risk Protocol High Risk Proctocol Risk Level: Good Risk Score: 1 Electronic Signature(s) Signed: 04/07/2022 4:55:17 PM By: Rhae Hammock RN Signed: 04/12/2022 8:46:10 AM By: Erenest Blank Entered By: Erenest Blank on 04/07/2022 09:10:24

## 2022-04-12 NOTE — Progress Notes (Signed)
FYI path is negative

## 2022-04-13 ENCOUNTER — Inpatient Hospital Stay (HOSPITAL_COMMUNITY): Payer: Medicare Other | Admitting: Certified Registered"

## 2022-04-13 ENCOUNTER — Other Ambulatory Visit: Payer: Self-pay

## 2022-04-13 ENCOUNTER — Encounter (HOSPITAL_COMMUNITY)
Admission: EM | Disposition: A | Discharge status: Skilled Nursing Facility | Payer: Self-pay | Source: Home / Self Care | Attending: Internal Medicine

## 2022-04-13 ENCOUNTER — Encounter (HOSPITAL_COMMUNITY): Payer: Self-pay | Admitting: Family Medicine

## 2022-04-13 DIAGNOSIS — M86671 Other chronic osteomyelitis, right ankle and foot: Secondary | ICD-10-CM

## 2022-04-13 DIAGNOSIS — M869 Osteomyelitis, unspecified: Secondary | ICD-10-CM

## 2022-04-13 DIAGNOSIS — E1169 Type 2 diabetes mellitus with other specified complication: Secondary | ICD-10-CM

## 2022-04-13 DIAGNOSIS — E11621 Type 2 diabetes mellitus with foot ulcer: Secondary | ICD-10-CM

## 2022-04-13 DIAGNOSIS — F419 Anxiety disorder, unspecified: Secondary | ICD-10-CM | POA: Diagnosis not present

## 2022-04-13 DIAGNOSIS — L97519 Non-pressure chronic ulcer of other part of right foot with unspecified severity: Secondary | ICD-10-CM | POA: Diagnosis not present

## 2022-04-13 DIAGNOSIS — N179 Acute kidney failure, unspecified: Secondary | ICD-10-CM | POA: Diagnosis not present

## 2022-04-13 HISTORY — PX: INCISION AND DRAINAGE OF WOUND: SHX1803

## 2022-04-13 LAB — CBC WITH DIFFERENTIAL/PLATELET
Abs Immature Granulocytes: 0.04 10*3/uL (ref 0.00–0.07)
Basophils Absolute: 0.1 10*3/uL (ref 0.0–0.1)
Basophils Relative: 1 %
Eosinophils Absolute: 0.2 10*3/uL (ref 0.0–0.5)
Eosinophils Relative: 2 %
HCT: 46.1 % — ABNORMAL HIGH (ref 36.0–46.0)
Hemoglobin: 14.9 g/dL (ref 12.0–15.0)
Immature Granulocytes: 0 %
Lymphocytes Relative: 26 %
Lymphs Abs: 3 10*3/uL (ref 0.7–4.0)
MCH: 27.4 pg (ref 26.0–34.0)
MCHC: 32.3 g/dL (ref 30.0–36.0)
MCV: 84.9 fL (ref 80.0–100.0)
Monocytes Absolute: 0.8 10*3/uL (ref 0.1–1.0)
Monocytes Relative: 7 %
Neutro Abs: 7.4 10*3/uL (ref 1.7–7.7)
Neutrophils Relative %: 64 %
Platelets: 299 10*3/uL (ref 150–400)
RBC: 5.43 MIL/uL — ABNORMAL HIGH (ref 3.87–5.11)
RDW: 13.2 % (ref 11.5–15.5)
WBC: 11.5 10*3/uL — ABNORMAL HIGH (ref 4.0–10.5)
nRBC: 0 % (ref 0.0–0.2)

## 2022-04-13 LAB — CULTURE, BLOOD (ROUTINE X 2)
Culture: NO GROWTH
Culture: NO GROWTH
Special Requests: ADEQUATE

## 2022-04-13 LAB — BASIC METABOLIC PANEL
Anion gap: 9 (ref 5–15)
BUN: 17 mg/dL (ref 6–20)
CO2: 30 mmol/L (ref 22–32)
Calcium: 9 mg/dL (ref 8.9–10.3)
Chloride: 96 mmol/L — ABNORMAL LOW (ref 98–111)
Creatinine, Ser: 1.08 mg/dL — ABNORMAL HIGH (ref 0.44–1.00)
GFR, Estimated: >60 mL/min (ref >60)
Glucose, Bld: 209 mg/dL — ABNORMAL HIGH (ref 70–99)
Potassium: 4.4 mmol/L (ref 3.5–5.1)
Sodium: 135 mmol/L (ref 135–145)

## 2022-04-13 LAB — GLUCOSE, CAPILLARY
Glucose-Capillary: 219 mg/dL — ABNORMAL HIGH (ref 70–99)
Glucose-Capillary: 152 mg/dL — ABNORMAL HIGH (ref 70–99)
Glucose-Capillary: 290 mg/dL — ABNORMAL HIGH (ref 70–99)
Glucose-Capillary: 464 mg/dL — ABNORMAL HIGH (ref 70–99)

## 2022-04-13 LAB — GLUCOSE, RANDOM: Glucose, Bld: 426 mg/dL — ABNORMAL HIGH (ref 70–99)

## 2022-04-13 SURGERY — IRRIGATION AND DEBRIDEMENT WOUND
Anesthesia: General | Laterality: Right

## 2022-04-13 MED ORDER — INSULIN GLARGINE-YFGN 100 UNIT/ML ~~LOC~~ SOLN
15.0000 [IU] | Freq: Once | SUBCUTANEOUS | Status: AC
Start: 2022-04-13 — End: 2022-04-13
  Administered 2022-04-13: 15 [IU] via SUBCUTANEOUS
  Filled 2022-04-13: qty 0.15

## 2022-04-13 MED ORDER — AMOXICILLIN-POT CLAVULANATE 875-125 MG PO TABS
1.0000 | ORAL_TABLET | Freq: Two times a day (BID) | ORAL | Status: DC
  Administered 2022-04-13 – 2022-04-19 (×13): 1 via ORAL
  Filled 2022-04-13 (×13): qty 1

## 2022-04-13 MED ORDER — ONDANSETRON HCL 4 MG/2ML IJ SOLN
INTRAMUSCULAR | Status: DC | PRN
  Administered 2022-04-13: 4 mg via INTRAVENOUS

## 2022-04-13 MED ORDER — KETOROLAC TROMETHAMINE 30 MG/ML IJ SOLN
30.0000 mg | Freq: Once | INTRAMUSCULAR | Status: AC | PRN
Start: 2022-04-13 — End: 2022-04-13
  Administered 2022-04-13: 30 mg via INTRAVENOUS

## 2022-04-13 MED ORDER — ROPIVACAINE HCL 5 MG/ML IJ SOLN
INTRAMUSCULAR | Status: DC | PRN
  Administered 2022-04-13: 25 mL via PERINEURAL

## 2022-04-13 MED ORDER — ONDANSETRON HCL 4 MG/2ML IJ SOLN: 4.0000 mg | Freq: Once | INTRAMUSCULAR | Status: DC | PRN

## 2022-04-13 MED ORDER — DEXAMETHASONE SODIUM PHOSPHATE 10 MG/ML IJ SOLN
INTRAMUSCULAR | Status: AC
  Filled 2022-04-13: qty 1

## 2022-04-13 MED ORDER — 0.9 % SODIUM CHLORIDE (POUR BTL) OPTIME
TOPICAL | Status: DC | PRN
  Administered 2022-04-13: 1000 mL

## 2022-04-13 MED ORDER — FENTANYL CITRATE (PF) 100 MCG/2ML IJ SOLN
INTRAMUSCULAR | Status: AC
  Filled 2022-04-13: qty 2

## 2022-04-13 MED ORDER — HYDROMORPHONE HCL 1 MG/ML IJ SOLN
INTRAMUSCULAR | Status: AC
  Filled 2022-04-13: qty 1

## 2022-04-13 MED ORDER — FENTANYL CITRATE (PF) 250 MCG/5ML IJ SOLN
INTRAMUSCULAR | Status: DC | PRN
  Administered 2022-04-13: 50 ug via INTRAVENOUS

## 2022-04-13 MED ORDER — LIDOCAINE 2% (20 MG/ML) 5 ML SYRINGE
INTRAMUSCULAR | Status: DC | PRN
  Administered 2022-04-13: 100 mg via INTRAVENOUS

## 2022-04-13 MED ORDER — LACTATED RINGERS IV SOLN: INTRAVENOUS | Status: DC

## 2022-04-13 MED ORDER — ONDANSETRON HCL 4 MG/2ML IJ SOLN
INTRAMUSCULAR | Status: AC
  Filled 2022-04-13: qty 2

## 2022-04-13 MED ORDER — FENTANYL CITRATE (PF) 100 MCG/2ML IJ SOLN
25.0000 ug | INTRAMUSCULAR | Status: DC | PRN
  Administered 2022-04-13: 50 ug via INTRAVENOUS

## 2022-04-13 MED ORDER — LIDOCAINE 2% (20 MG/ML) 5 ML SYRINGE
INTRAMUSCULAR | Status: AC
  Filled 2022-04-13: qty 5

## 2022-04-13 MED ORDER — MIDAZOLAM HCL 2 MG/2ML IJ SOLN
INTRAMUSCULAR | Status: AC
  Filled 2022-04-13: qty 2

## 2022-04-13 MED ORDER — PROPOFOL 10 MG/ML IV BOLUS
INTRAVENOUS | Status: DC | PRN
  Administered 2022-04-13: 200 mg via INTRAVENOUS

## 2022-04-13 MED ORDER — LIDOCAINE-EPINEPHRINE 1 %-1:100000 IJ SOLN
INTRAMUSCULAR | Status: AC
  Filled 2022-04-13: qty 1

## 2022-04-13 MED ORDER — PROPOFOL 10 MG/ML IV BOLUS
INTRAVENOUS | Status: AC
  Filled 2022-04-13: qty 20

## 2022-04-13 MED ORDER — BUPIVACAINE HCL (PF) 0.25 % IJ SOLN
INTRAMUSCULAR | Status: AC
  Filled 2022-04-13: qty 30

## 2022-04-13 MED ORDER — CHLORHEXIDINE GLUCONATE 0.12 % MT SOLN
15.0000 mL | Freq: Once | OROMUCOSAL | Status: AC
  Filled 2022-04-13: qty 15

## 2022-04-13 MED ORDER — DEXAMETHASONE SODIUM PHOSPHATE 10 MG/ML IJ SOLN
INTRAMUSCULAR | Status: DC | PRN
  Administered 2022-04-13: 4 mg via INTRAVENOUS

## 2022-04-13 MED ORDER — OXYCODONE HCL 5 MG PO TABS: 5.0000 mg | ORAL_TABLET | Freq: Once | ORAL | Status: DC | PRN

## 2022-04-13 MED ORDER — MIDAZOLAM HCL 2 MG/2ML IJ SOLN
INTRAMUSCULAR | Status: DC | PRN
  Administered 2022-04-13: 2 mg via INTRAVENOUS

## 2022-04-13 MED ORDER — HYDROMORPHONE HCL 1 MG/ML IJ SOLN
0.5000 mg | INTRAMUSCULAR | Status: DC | PRN
  Administered 2022-04-13 (×2): 0.5 mg via INTRAVENOUS

## 2022-04-13 MED ORDER — FENTANYL CITRATE (PF) 250 MCG/5ML IJ SOLN
INTRAMUSCULAR | Status: AC
  Filled 2022-04-13: qty 5

## 2022-04-13 MED ORDER — KETOROLAC TROMETHAMINE 30 MG/ML IJ SOLN
INTRAMUSCULAR | Status: AC
  Filled 2022-04-13: qty 1

## 2022-04-13 MED ORDER — INSULIN ASPART 100 UNIT/ML IJ SOLN
8.0000 [IU] | Freq: Once | INTRAMUSCULAR | Status: AC
  Administered 2022-04-13: 8 [IU] via SUBCUTANEOUS

## 2022-04-13 MED ORDER — ENOXAPARIN SODIUM 40 MG/0.4ML IJ SOSY
40.0000 mg | PREFILLED_SYRINGE | INTRAMUSCULAR | Status: DC
  Administered 2022-04-14 – 2022-04-19 (×6): 40 mg via SUBCUTANEOUS
  Filled 2022-04-13 (×6): qty 0.4

## 2022-04-13 MED ORDER — INSULIN GLARGINE-YFGN 100 UNIT/ML ~~LOC~~ SOLN
30.0000 [IU] | Freq: Every day | SUBCUTANEOUS | Status: DC
Start: 2022-04-14 — End: 2022-04-14
  Filled 2022-04-13: qty 0.3

## 2022-04-13 MED ORDER — CHLORHEXIDINE GLUCONATE 0.12 % MT SOLN
OROMUCOSAL | Status: AC
  Administered 2022-04-13: 15 mL via OROMUCOSAL
  Filled 2022-04-13: qty 15

## 2022-04-13 MED ORDER — INSULIN GLARGINE-YFGN 100 UNIT/ML ~~LOC~~ SOLN
15.0000 [IU] | Freq: Once | SUBCUTANEOUS | Status: AC
  Administered 2022-04-13: 15 [IU] via SUBCUTANEOUS
  Filled 2022-04-13: qty 0.15

## 2022-04-13 MED ORDER — PHENYLEPHRINE 80 MCG/ML (10ML) SYRINGE FOR IV PUSH (FOR BLOOD PRESSURE SUPPORT)
PREFILLED_SYRINGE | INTRAVENOUS | Status: AC
  Filled 2022-04-13: qty 10

## 2022-04-13 MED ORDER — PHENYLEPHRINE 80 MCG/ML (10ML) SYRINGE FOR IV PUSH (FOR BLOOD PRESSURE SUPPORT)
PREFILLED_SYRINGE | INTRAVENOUS | Status: DC | PRN
  Administered 2022-04-13: 160 ug via INTRAVENOUS
  Administered 2022-04-13 (×2): 240 ug via INTRAVENOUS

## 2022-04-13 MED ORDER — OXYCODONE HCL 5 MG/5ML PO SOLN: 5.0000 mg | Freq: Once | ORAL | Status: DC | PRN

## 2022-04-13 SURGICAL SUPPLY — 53 items
APL PRP STRL LF DISP 70% ISPRP (MISCELLANEOUS)
BAG COUNTER SPONGE SURGICOUNT (BAG) ×3 IMPLANT
BAG SPNG CNTER NS LX DISP (BAG) ×1
BLADE AVERAGE 25X9 (BLADE) ×3 IMPLANT
BNDG CMPR 9X4 STRL LF SNTH (GAUZE/BANDAGES/DRESSINGS)
BNDG ELASTIC 4X5.8 VLCR STR LF (GAUZE/BANDAGES/DRESSINGS) ×1 IMPLANT
BNDG ESMARK 4X9 LF (GAUZE/BANDAGES/DRESSINGS) IMPLANT
BNDG GAUZE ELAST 4 BULKY (GAUZE/BANDAGES/DRESSINGS) IMPLANT
CANISTER WOUNDNEG PRESSURE 500 (CANNISTER) ×1 IMPLANT
CHLORAPREP W/TINT 26 (MISCELLANEOUS) ×2 IMPLANT
COVER SURGICAL LIGHT HANDLE (MISCELLANEOUS) ×2 IMPLANT
CUFF TOURN SGL QUICK 18X4 (TOURNIQUET CUFF) IMPLANT
CUFF TOURN SGL QUICK 34 (TOURNIQUET CUFF)
CUFF TRNQT CYL 34X4.125X (TOURNIQUET CUFF) IMPLANT
DRAPE U-SHAPE 47X51 STRL (DRAPES) ×3 IMPLANT
DRESSING MATRIX 2X2 BILAYER (Tissue) IMPLANT
DRSG EMULSION OIL 3X3 NADH (GAUZE/BANDAGES/DRESSINGS) ×1 IMPLANT
DRSG MATRIX 2X2 BILAYER (Tissue) ×2 IMPLANT
DRSG VAC ATS SM SENSATRAC (GAUZE/BANDAGES/DRESSINGS) ×1 IMPLANT
ELECT CAUTERY BLADE 6.4 (BLADE) ×3 IMPLANT
ELECT REM PT RETURN 9FT ADLT (ELECTROSURGICAL) ×2
ELECTRODE REM PT RTRN 9FT ADLT (ELECTROSURGICAL) ×2 IMPLANT
GAUZE SPONGE 4X4 12PLY STRL (GAUZE/BANDAGES/DRESSINGS) IMPLANT
GLOVE BIO SURGEON STRL SZ7.5 (GLOVE) ×3 IMPLANT
GLOVE BIOGEL PI IND STRL 8 (GLOVE) ×2 IMPLANT
GLOVE BIOGEL PI INDICATOR 8 (GLOVE) ×1
GLOVE SURG UNDER POLY LF SZ7.5 (GLOVE) ×3 IMPLANT
GOWN STRL REUS W/ TWL LRG LVL3 (GOWN DISPOSABLE) ×4 IMPLANT
GOWN STRL REUS W/TWL LRG LVL3 (GOWN DISPOSABLE) ×4
HANDPIECE INTERPULSE COAX TIP (DISPOSABLE)
KIT BASIN OR (CUSTOM PROCEDURE TRAY) ×3 IMPLANT
KIT TURNOVER KIT B (KITS) ×3 IMPLANT
MANIFOLD NEPTUNE II (INSTRUMENTS) ×2 IMPLANT
MICROMATRIX 1000MG (Tissue) ×2 IMPLANT
NDL BIOPSY JAMSHIDI 8X6 (NEEDLE) IMPLANT
NDL HYPO 25GX1X1/2 BEV (NEEDLE) IMPLANT
NEEDLE BIOPSY JAMSHIDI 8X6 (NEEDLE) IMPLANT
NEEDLE HYPO 25GX1X1/2 BEV (NEEDLE) ×2 IMPLANT
NS IRRIG 1000ML POUR BTL (IV SOLUTION) ×3 IMPLANT
PACK ORTHO EXTREMITY (CUSTOM PROCEDURE TRAY) ×3 IMPLANT
PAD ARMBOARD 7.5X6 YLW CONV (MISCELLANEOUS) ×6 IMPLANT
PROBE DEBRIDE SONICVAC MISONIX (TIP) IMPLANT
SET CYSTO W/LG BORE CLAMP LF (SET/KITS/TRAYS/PACK) ×2 IMPLANT
SET HNDPC FAN SPRY TIP SCT (DISPOSABLE) ×2 IMPLANT
SOL PREP POV-IOD 4OZ 10% (MISCELLANEOUS) ×6 IMPLANT
SOLUTION PARTIC MCRMTRX 1000MG (Tissue) IMPLANT
STAPLER SKIN PROX WIDE 3.9 (STAPLE) ×1 IMPLANT
SUT ETHILON 3 0 FSL (SUTURE) ×6 IMPLANT
SYR CONTROL 10ML LL (SYRINGE) ×1 IMPLANT
TOWEL GREEN STERILE (TOWEL DISPOSABLE) ×3 IMPLANT
TOWEL GREEN STERILE FF (TOWEL DISPOSABLE) ×3 IMPLANT
TUBE CONNECTING 12X1/4 (SUCTIONS) ×4 IMPLANT
YANKAUER SUCT BULB TIP NO VENT (SUCTIONS) ×3 IMPLANT

## 2022-04-13 NOTE — Anesthesia Procedure Notes (Signed)
Procedure Name: LMA Insertion Date/Time: 04/13/2022 12:05 PM  Performed by: Barrington Ellison, CRNAPre-anesthesia Checklist: Patient identified, Emergency Drugs available, Suction available and Patient being monitored Patient Re-evaluated:Patient Re-evaluated prior to induction Oxygen Delivery Method: Circle System Utilized Preoxygenation: Pre-oxygenation with 100% oxygen Induction Type: IV induction Ventilation: Mask ventilation without difficulty LMA: LMA inserted LMA Size: 4.0 Number of attempts: 1 Placement Confirmation: positive ETCO2 Tube secured with: Tape Dental Injury: Teeth and Oropharynx as per pre-operative assessment

## 2022-04-13 NOTE — Anesthesia Procedure Notes (Signed)
Anesthesia Procedure Image    

## 2022-04-13 NOTE — Progress Notes (Signed)
PROGRESS NOTE    Katherine Herrera  LGX:[id number] DOB: 1966-10-02 DOA: 04/08/2022 PCP: Cassandria Anger, MD    Chief Complaint  Patient presents with   Foot Pain    Infection     Brief Narrative:  Patient 56 year old female with history of type 2 diabetes, history of childhood seizures, chronic pain syndrome, hyperlipidemia, hypothyroidism, chronic osteomyelitis of the right foot who was sent to the ED from wound care center for IV antibiotics due to worsening right foot wound, concern for worsening osteomyelitis.  Patient initially placed on IV antibiotics.  Admitted.  Podiatry consulted.    Assessment & Plan:  Principal Problem:   Osteomyelitis of foot ([place]) Active Problems:   AKI (acute kidney injury) ([place])   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)   Essential hypertension   Hypothyroidism   Pure hypercholesterolemia   Anxiety disorder   Chronic pain    Assessment and Plan: * Osteomyelitis of foot ([place]) 56 year old female with known chronic osteomyelitis of right 5th metatarsal in setting of T2DM who presented to ED for worsening wound and IV abx from wound care  -blood cultures negative x5 days., lactic acid 1.1  -no criteria for sepsis -Patient started empirically on IV vancomycin, IV cefepime, IV Flagyl.   -ABIs checked in 12/2021 and wnl  -MRI in 02/2022: Chronic wound at the lateral aspect of the fifth TMT joint with underlying chronic osteomyelitis of the cuboid and fourth and fifth metatarsal bases. No abscess. -podiatry consulted-Dr. Sherryle Lis assessed the patient and patient went to the OR had cultures of biopsies done in addition to irrigation and debridement as well as implantation of antibiotic beads and wound VAC  04/10/2022.   -IV antibiotics were initially held 04/09/2022 until biopsies and cultures were obtained which was done.  -Cultures showing Staphylococcus simulans. -Biopsy with no malignancy identified, gangrenous necrosis of skin and  subcutis with focal acute inflammation and surface bacterial overgrowth. -Patient with staged procedure, per podiatry will return to OR today, 04/13/2022 for soft tissue reconstruction. -Resumed IV cefepime and IV vancomycin postoperatively.   -Continue IV Flagyl.   -ID consulted and IV cefepime has been discontinued and patient placed on IV Rocephin. -Continue IV Flagyl, IV vancomycin -ID and podiatry following and I appreciate the input and recommendations.    AKI (acute kidney injury) ([place]) Renal function fluctuates. AKI vs. Baseline CKD Bolus in ED and patient placed on IV fluids.   -Renal function improved. Strict I/O Hold nephrotoxic drugs Continue to hold baclofen and lisinopril.   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) A1C in 10/2021 was 7.7 -Repeat hemoglobin A1c 7.5 (04/10/2022). -Patient noted to be hypoglycemic on admission likely secondary to acute infection, no signs of DKA. Osteomyelitis likely driving sugar uP. -Patient on 70/30 which has been discontinued and patient initially placed on long-acting insulin of Semglee 20 units in addition to SSI.  -Once no further procedures anticipated we will place back on home regimen 70/30.  -CBG 219 this morning. -We will give Semglee 15 units x 1 today as patient currently n.p.o. in anticipation of surgery and resume Semglee 30 units daily tomorrow 04/14/2022.  -Hold meal coverage for now and resume once diet is resumed postoperatively.  NovoLog meal coverage to 6 units 3 times daily with meals. -SSI.  Essential hypertension -Blood pressure currently stable.   -Lisinopril on hold secondary to AKI.   -Follow.  Hypothyroidism TSH wnl in 08/2021 Continue home synthroid   Pure hypercholesterolemia Continue zetia.  Statin intolerance.  Recent LDL of 135   Anxiety disorder Continue prozac and klonopin   Chronic pain Continue home oxycodone.  pmp website reviewed and fills correctly   COPD (chronic obstructive  pulmonary disease) (HCC)-resolved as of 04/08/2022 No signs of exacerbation          DVT prophylaxis: Lovenox Code Status: Full Family Communication: Updated patient.  No family at bedside. Disposition: SNF versus home with home health when cleared by podiatry.   Status is: Inpatient Remains inpatient appropriate because: Severity of illness.   Consultants:  Podiatry: Dr. Sherryle Lis 04/09/2022 ID: Dr. Candiss Norse 04/11/2022  Procedures:  Plain films right foot 04/08/2022 Irrigation and debridement right foot/application of wound VAC/placement of antibiotic beads per Dr. Sherryle Lis, podiatry 04/10/2022.  Antimicrobials:  IV cefepime 04/08/2022>>>> 04/09/2022 IV Flagyl 04/08/2022>>>>  IV vancomycin 04/08/2022>>>> 04/09/2022 IV vancomycin 04/10/2022>>>> IV cefepime 04/10/2022>>>>>> 04/11/2022 IV Rocephin 04/11/2022>>>>>   Subjective: On his cell phone.  No chest pain.  No shortness of breath.  No abdominal pain.  States current pain regimen is managing her pain.  Getting ready to go back to the operating room today.   Objective: Vitals:   04/12/22 2000 04/13/22 0510 04/13/22 0734 04/13/22 1058  BP: (!) 155/80 137/79 131/77 129/87  Pulse: 78 78 83 86  Resp: '18 18 16 18  '$ Temp: 98.5 F (36.9 C) 98 F (36.7 C) 98.2 F (36.8 C) 98.2 F (36.8 C)  TempSrc: Oral Oral Oral Oral  SpO2: 97% 97% 97% 94%  Weight:    98.4 kg  Height:    '5\' 9"'$  (1.753 m)    Intake/Output Summary (Last 24 hours) at 04/13/2022 1109 Last data filed at 04/13/2022 0531 Gross per 24 hour  Intake 1215.13 ml  Output --  Net 1215.13 ml   Filed Weights   04/08/22 1127 04/13/22 1058  Weight: 98.4 kg 98.4 kg    Examination:  General exam: NAD. Respiratory system: Lungs clear to auscultation bilaterally.  No wheezes, no crackles, no rhonchi.  Fair air movement.  Speaking in full sentences.   Cardiovascular system: RRR no murmurs rubs or gallops.  No JVD.  No lower extremity edema.  Gastrointestinal system: Abdomen is soft,  nontender, nondistended, positive bowel sounds.  No rebound.  No guarding.  Central nervous system: Alert and oriented. No focal neurological deficits. Extremities: Right foot bandaged with postop bandage, wound VAC noted.  Skin: No rashes, lesions or ulcers Psychiatry: Judgement and insight appear normal. Mood & affect appropriate.          Data Reviewed:   CBC: Recent Labs  Lab 04/08/22 1220 04/09/22 0052 04/10/22 0132 04/11/22 0657 04/12/22 0037 04/13/22 0012  WBC 9.4 7.0 6.8 8.4 9.1 11.5*  NEUTROABS 7.3  --  3.6 5.6 5.8 7.4  HGB 14.2 12.4 12.9 13.7 14.8 14.9  HCT 43.7 38.5 39.9 41.6 45.9 46.1*  MCV 87.9 88.7 87.1 87.6 85.5 84.9  PLT 261 250 245 263 277 884    Basic Metabolic Panel: Recent Labs  Lab 04/09/22 0052 04/10/22 0132 04/11/22 0657 04/12/22 0037 04/13/22 0012  NA 137 136 135 135 135  K 4.7 4.5 4.8 4.2 4.4  CL 100 98 98 99 96*  CO2 '29 31 27 26 30  '$ GLUCOSE 173* 204* 249* 193* 209*  BUN '18 19 16 16 17  '$ CREATININE 1.08* 1.09* 0.99 1.01* 1.08*  CALCIUM 8.4* 8.6* 8.7* 9.1 9.0    GFR: Estimated Creatinine Clearance: 72.6 mL/min (A) (by C-G formula based on SCr of 1.08 mg/dL (  H)).  Liver Function Tests: Recent Labs  Lab 04/08/22 1220  AST 12*  ALT 12  ALKPHOS 96  BILITOT 0.4  PROT 7.2  ALBUMIN 3.2*    CBG: Recent Labs  Lab 04/12/22 1222 04/12/22 1539 04/12/22 2027 04/13/22 0755 04/13/22 1101  GLUCAP 178* 241* 282* 219* 152*     Recent Results (from the past 240 hour(s))  Blood culture (routine x 2)     Status: None   Collection Time: 04/08/22 12:20 PM   Specimen: BLOOD  Result Value Ref Range Status   Specimen Description BLOOD LEFT ANTECUBITAL  Final   Special Requests   Final    BOTTLES DRAWN AEROBIC AND ANAEROBIC Blood Culture results may not be optimal due to an inadequate volume of blood received in culture bottles   Culture   Final    NO GROWTH 5 DAYS Performed at [facility], [place] [address] [place], [place]  [address]    Report Status 04/13/2022 FINAL  Final  Blood culture (routine x 2)     Status: None   Collection Time: 04/08/22  4:30 PM   Specimen: BLOOD LEFT HAND  Result Value Ref Range Status   Specimen Description BLOOD LEFT HAND  Final   Special Requests   Final    BOTTLES DRAWN AEROBIC AND ANAEROBIC Blood Culture adequate volume   Culture   Final    NO GROWTH 5 DAYS Performed at [facility], [place] [address], [place], [place] [address]    Report Status 04/13/2022 FINAL  Final  Surgical PCR screen     Status: None   Collection Time: 04/09/22  7:20 PM   Specimen: Nasal Mucosa; Nasal Swab  Result Value Ref Range Status   MRSA, PCR NEGATIVE NEGATIVE Final   Staphylococcus aureus NEGATIVE NEGATIVE Final    Comment: (NOTE) The Xpert SA Assay (FDA approved for NASAL specimens in patients 13 years of age and older), is one component of a comprehensive surveillance program. It is not intended to diagnose infection nor to guide or monitor treatment. Performed at [facility], [place] [address], [place], [place] [address]   Aerobic/Anaerobic Culture w Gram Stain (surgical/deep wound)     Status: None (Preliminary result)   Collection Time: 04/10/22 10:31 AM   Specimen: Wound  Result Value Ref Range Status   Specimen Description WOUND  Final   Special Requests RIGHT FOOT  Final   Gram Stain   Final    FEW WBC PRESENT, PREDOMINANTLY MONONUCLEAR RARE GRAM POSITIVE COCCI Performed at [facility], [address] [place],  [address]    Culture   Final    RARE STAPHYLOCOCCUS SIMULANS NO ANAEROBES ISOLATED; CULTURE IN PROGRESS FOR 5 DAYS    Report Status PENDING  Incomplete   Organism ID, Bacteria STAPHYLOCOCCUS SIMULANS  Final      Susceptibility   Staphylococcus simulans - MIC*    CIPROFLOXACIN <=0.5 SENSITIVE Sensitive     ERYTHROMYCIN >=8 RESISTANT Resistant     GENTAMICIN <=0.5 SENSITIVE Sensitive     OXACILLIN <=0.25 SENSITIVE Sensitive     TETRACYCLINE  >=16 RESISTANT Resistant     VANCOMYCIN <=0.5 SENSITIVE Sensitive     TRIMETH/SULFA <=10 SENSITIVE Sensitive     CLINDAMYCIN >=8 RESISTANT Resistant     RIFAMPIN <=0.5 SENSITIVE Sensitive     Inducible Clindamycin NEGATIVE Sensitive     * RARE STAPHYLOCOCCUS SIMULANS  Culture, fungus without smear     Status: None (Preliminary  result)   Collection Time: 04/10/22 10:31 AM   Specimen: Tissue; Other  Result Value Ref Range Status   Specimen Description TISSUE  Final   Special Requests NONE  Final   Culture   Final    NO FUNGUS ISOLATED AFTER 1 DAY Performed at [facility], [address] [place], [place] [address]    Report Status PENDING  Incomplete         Radiology Studies: No results found.      Scheduled Meds:  [MAR Hold] amoxicillin-clavulanate  1 tablet Oral Q12H   [MAR Hold] bisacodyl  10 mg Oral QHS   [MAR Hold] docusate sodium  100 mg Oral QHS   [MAR Hold] enoxaparin (LOVENOX) injection  40 mg Subcutaneous Q24H   [MAR Hold] ezetimibe  10 mg Oral Daily   [MAR Hold] FLUoxetine  10 mg Oral Daily   [MAR Hold] gabapentin  800 mg Oral TID   [MAR Hold] insulin aspart  0-5 Units Subcutaneous QHS   [MAR Hold] insulin aspart  0-9 Units Subcutaneous TID WC   [MAR Hold] insulin aspart  6 Units Subcutaneous TID WC   [MAR Hold] insulin glargine-yfgn  30 Units Subcutaneous Daily   [MAR Hold] lamoTRIgine  25 mg Oral BID   [MAR Hold] levothyroxine  75 mcg Oral QAC breakfast   [MAR Hold] mupirocin ointment  1 application  Nasal BID   [MAR Hold] rOPINIRole  0.5 mg Oral Daily   Continuous Infusions:     LOS: 5 days    Time spent: 35 minutes.    Irine Seal, MD [facility]   To contact the attending provider between 7A-7P or the covering provider during after hours 7P-7A, please log into the web site [email or web address] and access using universal [place] password for that web site. If you do not have the password, please call the hospital  operator.  04/13/2022, 11:09 AM

## 2022-04-13 NOTE — Interval H&P Note (Signed)
History and Physical Interval Note:  04/13/2022 11:44 AM  Katherine Herrera  has presented today for surgery, with the diagnosis of OSTEOMYOLITITIS RIGHT FOOT.  The various methods of treatment have been discussed with the patient and family. After consideration of risks, benefits and other options for treatment, the patient has consented to  Procedure(s): IRRIGATION AND DEBRIDEMENT WOUND (Right) with integra as a surgical intervention.  The patient's history has been reviewed, patient examined, no change in status, stable for surgery.  I have reviewed the patient's chart and labs.  Questions were answered to the patient's satisfaction.     Felipa Furnace

## 2022-04-13 NOTE — Progress Notes (Signed)
PT Cancellation Note  Patient Details Name: Katherine Herrera MRN: [id number] DOB: 1966-05-13   Cancelled Treatment:    Reason Eval/Treat Not Completed: Patient at procedure or test/unavailable (Pt went for a 2nd I&D.  Will check back tomorrow.)   Alvira Philips 04/13/2022, 11:27 AM Lovie Zarling M,PT Acute Rehab Services [phone]

## 2022-04-13 NOTE — Op Note (Signed)
Surgeon: Surgeon(s): Felipa Furnace, DPM  Assistants: None Pre-operative diagnosis: OSTEOMYOLITITIS RIGHT FOOT  Post-operative diagnosis: same Procedure: Procedure(s) (LRB): IRRIGATION AND DEBRIDEMENT WOUND (Right) with application of Integra and negative pressure wound therapy Pathology: * No specimens in log *  Pertinent Intra-op findings: No clinical signs of infection noted.  Bone appeared to be hard indurated.  No purulent drainage noted. Anesthesia: General  Hemostasis: * No tourniquets in log * EBL: 50 cc Materials: Integra bilayer with negative pressure wound therapy Injectables: None Complications: None  Indications for surgery: A 56 y.o. female presents with right foot osteomyelitis with diabetic foot ulceration. Patient has failed all conservative therapy including but not limited to local wound care IV antibiotics. She wishes to have surgical correction of the foot/deformity. It was determined that patient would benefit from right excisional debridement of the wound with application of Integra bilayer with application of negative pressure wound therapy as part of a staged procedure. Informed surgical risk consent was reviewed and read aloud to the patient.  I reviewed the films.  I have discussed my findings with the patient in great detail.  I have discussed all risks including but not limited to infection, stiffness, scarring, limp, disability, deformity, damage to blood vessels and nerves, numbness, poor healing, need for braces, arthritis, chronic pain, amputation, death.  All benefits and realistic expectations discussed in great detail.  I have made no promises as to the outcome.  I have provided realistic expectations.  I have offered the patient a 2nd opinion, which they have declined and assured me they preferred to proceed despite the risks   Procedure in detail: The patient was both verbally and visually identified by myself, the nursing staff, and anesthesia staff in the  preoperative holding area. They were then transferred to the operating room and placed on the operative table in supine position.  Attention was directed to the right lateral foot at the site of previous wound.  Using rongeur the wound was thoroughly debrided down to healthy bleeding tissue.  Previous antibiotic beads were removed.  At this time no clear signs of infection noted.  No purulent drainage noted.  Wound was granular in nature.  It was determined that patient will benefit from application of Integra bilayer.  The wound was thoroughly irrigated with normal saline solution.  Integra bilayer was stapled and secured in standard technique.  In order to facilitate granulation tissue negative pressure wound therapy was applied on top of the Integra bilayer.  Negative pressure wound therapy was set at 225 mmHg continuous pressure.  The wound was dressed with 4 x 4 gauze Kerlix Ace bandage.  All bony prominences were adequately padded.  Disposition Patient is okay to be discharged from my standpoint.  Patient will need Monday Wednesday Friday VAC change.  She will also benefit from nonweightbearing to the right lower extremity.  She may benefit from SNF.  Antibiotics per infectious disease.  At the conclusion of the procedure the patient was awoken from anesthesia and found to have tolerated the procedure well any complications. There were transferred to PACU with vital signs stable and vascular status intact.  Boneta Lucks, DPM

## 2022-04-13 NOTE — Transfer of Care (Signed)
Immediate Anesthesia Transfer of Care Note  Patient: Katherine Herrera  Procedure(s) Performed: IRRIGATION AND DEBRIDEMENT WOUND (Right)  Patient Location: PACU  Anesthesia Type:General and Regional  Level of Consciousness: oriented  Airway & Oxygen Therapy: Patient Spontanous Breathing and Patient connected to nasal cannula oxygen  Post-op Assessment: Report given to RN  Post vital signs: Reviewed and stable  Last Vitals:  Vitals Value Taken Time  BP 134/79 04/13/22 1236  Temp    Pulse 76 04/13/22 1236  Resp 15 04/13/22 1236  SpO2 93 % 04/13/22 1236  Vitals shown include unvalidated device data.  Last Pain:  Vitals:   04/13/22 1058  TempSrc: Oral  PainSc:       Patients Stated Pain Goal: 2 (81/44/81 8563)  Complications: No notable events documented.

## 2022-04-13 NOTE — Anesthesia Procedure Notes (Signed)
Anesthesia Regional Block: Popliteal block   Pre-Anesthetic Checklist: , timeout performed,  Correct Patient, Correct Site, Correct Laterality,  Correct Procedure, Correct Position, site marked,  Risks and benefits discussed,  Surgical consent,  Pre-op evaluation,  At surgeon's request and post-op pain management  Laterality: Right  Prep: chloraprep       Needles:  Injection technique: Single-shot  Needle Type: Echogenic Needle     Needle Length: 9cm      Additional Needles:   Procedures:,,,, ultrasound used (permanent image in chart),,    Narrative:  Start time: 04/13/2022 11:49 AM End time: 04/13/2022 11:56 AM Injection made incrementally with aspirations every 5 mL.  Performed by: Personally  Anesthesiologist: Myrtie Soman, MD  Additional Notes: Patient tolerated the procedure well without complications

## 2022-04-13 NOTE — Inpatient Diabetes Management (Signed)
Inpatient Diabetes Program Recommendations  AACE/ADA: New Consensus Statement on Inpatient Glycemic Control   Target Ranges:  Prepandial:   less than 140 mg/dL      Peak postprandial:   less than 180 mg/dL (1-2 hours)      Critically ill patients:  140 - 180 mg/dL    Latest Reference Range & Units 04/12/22 08:16 04/12/22 12:22 04/12/22 15:39 04/12/22 20:27 04/13/22 07:55  Glucose-Capillary 70 - 99 mg/dL 152 (H) 178 (H) 241 (H) 282 (H) 219 (H)   Review of Glycemic Control  Diabetes history: DM2 Outpatient Diabetes medications: 70/30 30 units with breakfast and lunch, 70/30 40 units with supper Current orders for Inpatient glycemic control: Semglee 30 units daily, Novolog 6 units TID with meals, Novolog 0-9 units QHS, Novolog 0-5 units QHS  Inpatient Diabetes Program Recommendations:    Insulin: Please consider increasing Semglee to 35 units daily and meal coverage to Novolog 9 units TID with meals.  Thanks, Barnie Alderman, RN, MSN, [place] Diabetes Coordinator Inpatient Diabetes Program [phone] (Team Pager from 8am to [place])

## 2022-04-13 NOTE — Anesthesia Postprocedure Evaluation (Signed)
Anesthesia Post Note  Patient: Katherine Herrera  Procedure(s) Performed: IRRIGATION AND DEBRIDEMENT WOUND (Right)     Patient location during evaluation: PACU Anesthesia Type: General Level of consciousness: awake and alert Pain management: pain level controlled Vital Signs Assessment: post-procedure vital signs reviewed and stable Respiratory status: spontaneous breathing, nonlabored ventilation, respiratory function stable and patient connected to nasal cannula oxygen Cardiovascular status: blood pressure returned to baseline and stable Postop Assessment: no apparent nausea or vomiting Anesthetic complications: no   No notable events documented.  Last Vitals:  Vitals:   04/13/22 1330 04/13/22 1348  BP: 118/72 136/78  Pulse: 69 76  Resp: 18 16  Temp: 36.7 C 36.6 C  SpO2: 93% 91%    Last Pain:  Vitals:   04/13/22 1348  TempSrc: Oral  PainSc:                  Sally Reimers S

## 2022-04-13 NOTE — Anesthesia Preprocedure Evaluation (Signed)
Anesthesia Evaluation  Patient identified by MRN, date of birth, ID band Patient awake    Reviewed: Allergy & Precautions, NPO status , Patient's Chart, lab work & pertinent test results  Airway Mallampati: II  TM Distance: >3 FB Neck ROM: Full    Dental no notable dental hx.    Pulmonary COPD, Current Smoker and Patient abstained from smoking.,    Pulmonary exam normal breath sounds clear to auscultation       Cardiovascular hypertension, + DOE  Normal cardiovascular exam Rhythm:Regular Rate:Normal     Neuro/Psych negative neurological ROS  negative psych ROS   GI/Hepatic negative GI ROS, Neg liver ROS,   Endo/Other  diabetes, Type 2Hypothyroidism   Renal/GU negative Renal ROS  negative genitourinary   Musculoskeletal negative musculoskeletal ROS (+)   Abdominal   Peds negative pediatric ROS (+)  Hematology negative hematology ROS (+)   Anesthesia Other Findings   Reproductive/Obstetrics negative OB ROS                             Anesthesia Physical Anesthesia Plan  ASA: 3  Anesthesia Plan: General   Post-op Pain Management: Minimal or no pain anticipated   Induction: Intravenous  PONV Risk Score and Plan: 2 and Ondansetron, Dexamethasone and Treatment may vary due to age or medical condition  Airway Management Planned: LMA  Additional Equipment:   Intra-op Plan:   Post-operative Plan: Extubation in OR  Informed Consent: I have reviewed the patients History and Physical, chart, labs and discussed the procedure including the risks, benefits and alternatives for the proposed anesthesia with the patient or authorized representative who has indicated his/her understanding and acceptance.     Dental advisory given  Plan Discussed with: CRNA and Surgeon  Anesthesia Plan Comments:         Anesthesia Quick Evaluation

## 2022-04-13 NOTE — Progress Notes (Signed)
patient brought to pre-op holding area. found left hand peripheral iv red, swollen, and painful. questioned patient if staff had been using IV. patient stated they had been using it to give her antibiotics and she had voiced concerns for two to three days that it was painful while infusing. stated staff told her that the pain would stop after a few minutes but patient stated it never did and they just kept using it. Flushed IV with 2cc of normal saline and patient yelled out that it was very painful. Removed IV, site red, edematous, and painful. placed new line for surgery. educated patient that it should not hurt to have fluids or antibiotics ran through line and to alert staff if this new line became bothersome. encouraged patient to ask staff for ice to once out of surgery to place on left hand to help with swelling. called ashley, floor rn and made aware. rn stated she would ice left hand when patient returned from surgery. Made patient aware that RN would ice hand when she returned.

## 2022-04-13 NOTE — Progress Notes (Signed)
[facility] for Infectious Disease  Date of Admission:  Katherine Herrera/07/2022   Total days of inpatient antibiotics 4  Principal Problem:   Osteomyelitis of foot (Katherine Herrera) Active Problems:   Hypothyroidism   Anxiety disorder   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)   Chronic pain   AKI (acute kidney injury) (Katherine Herrera)   Essential hypertension   Pure hypercholesterolemia          Assessment: Katherine Herrera Herrera with DM and chronic right foot wound with Hx of prior I&Ds admitted for acute infection of wound.  # Chronic right foot osteomyelitis SP I&D with Bx bone cortex right foot, application of abx beads. #DM - MRI on 5/12 right foot showed chronic wound at the lateral aspect of the fifth TMT joint, underlying chronic osteomyelitis of the cuboid, fourth, fifth metatarsal bases. -Xray on Katherine Herrera/9, chronic osteomyelitis with 5th metatarsal and cuboid.  -Taken to OR on Katherine Herrera/11 with podiatry for I&D of wound. Found to have necrotic soft tissue which was debrided. Deep tissue Cx sent to micro which only grew staph simulans(likely contaminant), Bone Bx sent to pathology which did not show evidence of acute osteomyelitis( right 5th metatarsal, and cuboid), soft tissue showed gangrenous necrosis of skin and subq inflammation and surface bacterial overgrowth. Will plan to  treat with 3 weeks of antibiotics from last I&D for deep tissue infection.  -Of note she had a bone Bx of 7/30 that of cuboid and 5 th metatarsal which did not show signs of acute inflammation . MRI on 7/3 had shown acute on chronic OM of cuboid, 4 and 5th metatarsal and bases.  -Podiatry plans to take pt for repeat I&D and possible Integra application today 4/80/16 Recommendations: -D/C vancomycin, ceftriaxone and cefepime -Start cefadroxil '500mg'$  pO bid x 3 week dfrom OR today (EOT 7/4) for deep tissue wound -ID follow-up on 7/17 with myself   Microbiology:   Antibiotics: Cefepime Katherine Herrera/09-p Ceftriaxone Katherine Herrera/09 Metronidazole  Katherine Herrera/9-p Vancomycin Katherine Herrera/09-p   Cultures: Blood Katherine Herrera/09 NG   OR Cx: + staph simulans  SUBJECTIVE: Resting in bed. No new complaints.   Review of Systems: Review of Systems  All other systems reviewed and are negative.    Scheduled Meds:  [MAR Hold] amoxicillin-clavulanate  1 tablet Oral Q12H   [MAR Hold] bisacodyl  10 mg Oral QHS   [MAR Hold] docusate sodium  100 mg Oral QHS   [MAR Hold] enoxaparin (LOVENOX) injection  40 mg Subcutaneous Q24H   [MAR Hold] ezetimibe  10 mg Oral Daily   [MAR Hold] FLUoxetine  10 mg Oral Daily   [MAR Hold] gabapentin  800 mg Oral TID   [MAR Hold] insulin aspart  0-5 Units Subcutaneous QHS   [MAR Hold] insulin aspart  0-9 Units Subcutaneous TID WC   [MAR Hold] insulin aspart  Katherine Herrera Units Subcutaneous TID WC   [MAR Hold] insulin glargine-yfgn  30 Units Subcutaneous Daily   [MAR Hold] lamoTRIgine  25 mg Oral BID   [MAR Hold] levothyroxine  75 mcg Oral QAC breakfast   [MAR Hold] mupirocin ointment  1 application  Nasal BID   [MAR Hold] rOPINIRole  0.5 mg Oral Daily   Continuous Infusions:  lactated ringers 10 mL/hr at 04/13/22 1122   PRN Meds:.[MAR Hold] acetaminophen **OR** [MAR Hold] acetaminophen, [MAR Hold] clonazePAM, [MAR Hold] loratadine, [MAR Hold]  morphine injection, [MAR Hold] oxyCODONE Allergies  Allergen Reactions   Cephalexin Nausea And Vomiting   Demerol  [Meperidine Hcl] Rash  Lovastatin Other (See Comments)    Possible myalgia    Metformin And Related Nausea And Vomiting   Sulfamethoxazole-Trimethoprim     Other reaction(s): Unknown DILI, pancreatitis   Crestor [Rosuvastatin Calcium]     myalgia   Hydromorphone     Other reaction(s): Unknown   Niacin Other (See Comments)    Unknown    Other Other (See Comments)   Hydromorphone Hcl Itching    Patient has been tolerating Hydromorphone tablets without adverse effect (07/09/19) Other reaction(s): Unknown   Paroxetine Other (See Comments)    Unknown     OBJECTIVE: Vitals:    04/12/22 2000 04/13/22 0510 04/13/22 0734 04/13/22 1058  BP: (!) 155/80 137/79 131/77 129/87  Pulse: 78 78 83 86  Resp: '18 18 16 18  '$ Temp: 98.5 F (36.9 C) 98 F (36.7 C) 98.2 F (36.8 C) 98.2 F (36.8 C)  TempSrc: Oral Oral Oral Oral  SpO2: 97% 97% 97% 94%  Weight:    98.4 kg  Height:    '5\' 9"'$  (1.753 m)   Body mass index is 32.04 kg/m.  Physical Exam Constitutional:      Appearance: Normal appearance.  HENT:     Head: Normocephalic and atraumatic.     Right Ear: Tympanic membrane normal.     Left Ear: Tympanic membrane normal.     Nose: Nose normal.     Mouth/Throat:     Mouth: Mucous membranes are moist.  Eyes:     Extraocular Movements: Extraocular movements intact.     Conjunctiva/sclera: Conjunctivae normal.     Pupils: Pupils are equal, round, and reactive to light.  Cardiovascular:     Rate and Rhythm: Normal rate and regular rhythm.     Heart sounds: No murmur heard.    No friction rub. No gallop.  Pulmonary:     Effort: Pulmonary effort is normal.     Breath sounds: Normal breath sounds.  Abdominal:     General: Abdomen is flat.     Palpations: Abdomen is soft.  Musculoskeletal:     Comments: Right foot wound vac  Skin:    General: Skin is warm and dry.  Neurological:     General: No focal deficit present.     Mental Status: She is alert and oriented to person, place, and time.  Psychiatric:        Mood and Affect: Mood normal.       Lab Results Lab Results  Component Value Date   WBC 11.5 (H) 04/13/2022   HGB 14.9 04/13/2022   HCT 46.1 (H) 04/13/2022   MCV 84.9 04/13/2022   PLT 299 04/13/2022    Lab Results  Component Value Date   CREATININE 1.08 (H) 04/13/2022   BUN 17 04/13/2022   NA 135 04/13/2022   K 4.4 04/13/2022   CL 96 (L) 04/13/2022   CO2 30 04/13/2022    Lab Results  Component Value Date   ALT 12 04/08/2022   AST 12 (L) 04/08/2022   ALKPHOS 96 04/08/2022   BILITOT 0.4 04/08/2022        Katherine Herrera Herrera, [place] [facility] Katherine Herrera/14/2023, 11:25 AM

## 2022-04-14 ENCOUNTER — Encounter (HOSPITAL_COMMUNITY): Payer: Self-pay | Admitting: Podiatry

## 2022-04-14 DIAGNOSIS — M86671 Other chronic osteomyelitis, right ankle and foot: Secondary | ICD-10-CM | POA: Diagnosis not present

## 2022-04-14 DIAGNOSIS — M869 Osteomyelitis, unspecified: Secondary | ICD-10-CM | POA: Diagnosis not present

## 2022-04-14 DIAGNOSIS — F419 Anxiety disorder, unspecified: Secondary | ICD-10-CM | POA: Diagnosis not present

## 2022-04-14 DIAGNOSIS — N179 Acute kidney failure, unspecified: Secondary | ICD-10-CM | POA: Diagnosis not present

## 2022-04-14 LAB — URINALYSIS, ROUTINE W REFLEX MICROSCOPIC
Bilirubin Urine: NEGATIVE
Glucose, UA: NEGATIVE mg/dL
Hgb urine dipstick: NEGATIVE
Ketones, ur: NEGATIVE mg/dL
Leukocytes,Ua: NEGATIVE
Nitrite: NEGATIVE
Protein, ur: NEGATIVE mg/dL
Specific Gravity, Urine: 1.006 (ref 1.005–1.030)
pH: 5 (ref 5.0–8.0)

## 2022-04-14 LAB — BASIC METABOLIC PANEL
Anion gap: 12 (ref 5–15)
Anion gap: 10 (ref 5–15)
BUN: 27 mg/dL — ABNORMAL HIGH (ref 6–20)
BUN: 23 mg/dL — ABNORMAL HIGH (ref 6–20)
CO2: 25 mmol/L (ref 22–32)
CO2: 25 mmol/L (ref 22–32)
Calcium: 9 mg/dL (ref 8.9–10.3)
Calcium: 8.9 mg/dL (ref 8.9–10.3)
Chloride: 94 mmol/L — ABNORMAL LOW (ref 98–111)
Chloride: 98 mmol/L (ref 98–111)
Creatinine, Ser: 1.56 mg/dL — ABNORMAL HIGH (ref 0.44–1.00)
Creatinine, Ser: 1.03 mg/dL — ABNORMAL HIGH (ref 0.44–1.00)
GFR, Estimated: 39 mL/min — ABNORMAL LOW (ref >60)
GFR, Estimated: >60 mL/min (ref >60)
Glucose, Bld: 428 mg/dL — ABNORMAL HIGH (ref 70–99)
Glucose, Bld: 233 mg/dL — ABNORMAL HIGH (ref 70–99)
Potassium: 6.1 mmol/L — ABNORMAL HIGH (ref 3.5–5.1)
Potassium: 4.6 mmol/L (ref 3.5–5.1)
Sodium: 131 mmol/L — ABNORMAL LOW (ref 135–145)
Sodium: 133 mmol/L — ABNORMAL LOW (ref 135–145)

## 2022-04-14 LAB — CBC WITH DIFFERENTIAL/PLATELET
Abs Immature Granulocytes: 0.04 10*3/uL (ref 0.00–0.07)
Abs Immature Granulocytes: 0.04 10*3/uL (ref 0.00–0.07)
Basophils Absolute: 0.1 10*3/uL (ref 0.0–0.1)
Basophils Absolute: 0 10*3/uL (ref 0.0–0.1)
Basophils Relative: 0 %
Basophils Relative: 0 %
Eosinophils Absolute: 0 10*3/uL (ref 0.0–0.5)
Eosinophils Absolute: 0.2 10*3/uL (ref 0.0–0.5)
Eosinophils Relative: 0 %
Eosinophils Relative: 1 %
HCT: 46.7 % — ABNORMAL HIGH (ref 36.0–46.0)
HCT: 44.9 % (ref 36.0–46.0)
Hemoglobin: 15.1 g/dL — ABNORMAL HIGH (ref 12.0–15.0)
Hemoglobin: 14.9 g/dL (ref 12.0–15.0)
Immature Granulocytes: 0 %
Immature Granulocytes: 0 %
Lymphocytes Relative: 16 %
Lymphocytes Relative: 24 %
Lymphs Abs: 2.6 10*3/uL (ref 0.7–4.0)
Lymphs Abs: 2.8 10*3/uL (ref 0.7–4.0)
MCH: 27.5 pg (ref 26.0–34.0)
MCH: 28 pg (ref 26.0–34.0)
MCHC: 32.3 g/dL (ref 30.0–36.0)
MCHC: 33.2 g/dL (ref 30.0–36.0)
MCV: 85.1 fL (ref 80.0–100.0)
MCV: 84.2 fL (ref 80.0–100.0)
Monocytes Absolute: 0.9 10*3/uL (ref 0.1–1.0)
Monocytes Absolute: 0.6 10*3/uL (ref 0.1–1.0)
Monocytes Relative: 6 %
Monocytes Relative: 5 %
Neutro Abs: 12.4 10*3/uL — ABNORMAL HIGH (ref 1.7–7.7)
Neutro Abs: 7.8 10*3/uL — ABNORMAL HIGH (ref 1.7–7.7)
Neutrophils Relative %: 78 %
Neutrophils Relative %: 70 %
Platelets: 340 10*3/uL (ref 150–400)
Platelets: 282 10*3/uL (ref 150–400)
RBC: 5.49 MIL/uL — ABNORMAL HIGH (ref 3.87–5.11)
RBC: 5.33 MIL/uL — ABNORMAL HIGH (ref 3.87–5.11)
RDW: 13.1 % (ref 11.5–15.5)
RDW: 12.9 % (ref 11.5–15.5)
WBC: 16 10*3/uL — ABNORMAL HIGH (ref 4.0–10.5)
WBC: 11.4 10*3/uL — ABNORMAL HIGH (ref 4.0–10.5)
nRBC: 0 % (ref 0.0–0.2)
nRBC: 0 % (ref 0.0–0.2)

## 2022-04-14 LAB — SODIUM, URINE, RANDOM: Sodium, Ur: 23 mmol/L

## 2022-04-14 LAB — GLUCOSE, CAPILLARY
Glucose-Capillary: 263 mg/dL — ABNORMAL HIGH (ref 70–99)
Glucose-Capillary: 198 mg/dL — ABNORMAL HIGH (ref 70–99)
Glucose-Capillary: 108 mg/dL — ABNORMAL HIGH (ref 70–99)
Glucose-Capillary: 210 mg/dL — ABNORMAL HIGH (ref 70–99)

## 2022-04-14 LAB — CREATININE, URINE, RANDOM: Creatinine, Urine: 35.1 mg/dL

## 2022-04-14 MED ORDER — SODIUM CHLORIDE 0.9 % IV SOLN: INTRAVENOUS | Status: DC

## 2022-04-14 MED ORDER — INSULIN GLARGINE-YFGN 100 UNIT/ML ~~LOC~~ SOLN
35.0000 [IU] | Freq: Every day | SUBCUTANEOUS | Status: DC
Start: 2022-04-14 — End: 2022-04-14
  Administered 2022-04-14: 35 [IU] via SUBCUTANEOUS
  Filled 2022-04-14 (×2): qty 0.35

## 2022-04-14 MED ORDER — INSULIN ASPART PROT & ASPART (70-30 MIX) 100 UNIT/ML ~~LOC~~ SUSP
30.0000 [IU] | Freq: Every day | SUBCUTANEOUS | Status: DC
  Filled 2022-04-14: qty 10

## 2022-04-14 MED ORDER — INSULIN ASPART PROT & ASPART (70-30 MIX) 100 UNIT/ML ~~LOC~~ SUSP
40.0000 [IU] | Freq: Every day | SUBCUTANEOUS | Status: DC
  Filled 2022-04-14: qty 10

## 2022-04-14 MED ORDER — INSULIN ASPART 100 UNIT/ML IJ SOLN
9.0000 [IU] | Freq: Three times a day (TID) | INTRAMUSCULAR | Status: DC
  Administered 2022-04-14 (×2): 9 [IU] via SUBCUTANEOUS

## 2022-04-14 NOTE — Inpatient Diabetes Management (Signed)
Inpatient Diabetes Program Recommendations  AACE/ADA: New Consensus Statement on Inpatient Glycemic Control   Target Ranges:  Prepandial:   less than 140 mg/dL      Peak postprandial:   less than 180 mg/dL (1-2 hours)      Critically ill patients:  140 - 180 mg/dL    Latest Reference Range & Units 04/13/22 07:55 04/13/22 10:36 04/13/22 11:01 04/13/22 12:07 04/13/22 16:27 04/13/22 22:23  Glucose-Capillary 70 - 99 mg/dL 219 (H)  Novolog 9 units      Semglee 15 units 152 (H)       Decadron 4 mg 290 (H)  Novolog 11 units  464 (H)  Novolog 8 units   Semglee 15 units    Review of Glycemic Control  Diabetes history: DM2 Outpatient Diabetes medications: 70/30 30 units with breakfast and lunch, 70/30 40 units with supper Current orders for Inpatient glycemic control: Semglee 30 units daily, Novolog 6 units TID with meals, Novolog 0-9 units QHS, Novolog 0-5 units QHS   Inpatient Diabetes Program Recommendations:     Insulin: Please consider increasing Semglee to 35 units daily and meal coverage to Novolog 9 units TID with meals.   Thanks, Barnie Alderman, RN, MSN, [place] [facility] Coordinator Inpatient Diabetes Program [phone] (Team Pager from 8am to [place])

## 2022-04-14 NOTE — Evaluation (Signed)
Occupational Therapy Evaluation Patient Details Name: Katherine Herrera MRN: [id number] DOB: 1966/03/15 Today's Date: 04/14/2022   History of Present Illness Patient 56 year old female admitted 6/9 due to worsening right foot wound, concern for worsening osteomyelitis. Pt underwent I&D and wound VAC placement right foot on 6/11.  PMH: type 2 diabetes, history of childhood seizures, chronic pain syndrome, hyperlipidemia, hypothyroidism   Clinical Impression   Patient is currently requiring assistance with ADLs including up to moderate assist with Lower body ADLs, setup assist with seated Upper body ADLs,  as well as  Minimal assist with functional transfers to recliner/BSC due to decreased eccentric control.  Current level of function is below patient's typical baseline.  During this evaluation, patient was limited by Pain, NWB restriction to RLE, impaired activity tolerance, and impulsivity with need of safety cues, all of which has the potential to impact patient's safety and independence during functional mobility, as well as performance for ADLs.  Patient lives at home alone in a 1 level apartment with ramp to enter. No known assistance available. Patient demonstrates good rehab potential, and should benefit from continued skilled occupational therapy services while in acute care to maximize safety, independence and quality of life at home.  Continued occupational therapy services in a AIR setting prior to return home is recommended.  ?      Recommendations for follow up therapy are one component of a multi-disciplinary discharge planning process, led by the attending physician.  Recommendations may be updated based on patient status, additional functional criteria and insurance authorization.   Follow Up Recommendations  Acute inpatient rehab (3hours/day)    Assistance Recommended at Discharge Intermittent Supervision/Assistance  Patient can return home with the following A little help with  walking and/or transfers;A little help with bathing/dressing/bathroom    Functional Status Assessment  Patient has had a recent decline in their functional status and demonstrates the ability to make significant improvements in function in a reasonable and predictable amount of time.  Equipment Recommendations  Other (comment);Tub/shower bench;BSC/3in1 (Pt stated that her NWB will expire in 2 weeks then she may be able to weight on RLE. Unable to confirm this in chart. Will defer to post-acute recommendations to clarify. Current recommendations if pt remains NWB for home:)    Recommendations for Other Services Rehab consult     Precautions / Restrictions Precautions Precautions: Fall Precaution Comments: Wound VAC right foot Restrictions Weight Bearing Restrictions: Yes RLE Weight Bearing: Non weight bearing      Mobility Bed Mobility Overal bed mobility: Modified Independent                  Transfers                          Balance Overall balance assessment: Needs assistance Sitting-balance support: No upper extremity supported, Feet supported Sitting balance-Leahy Scale: Good     Standing balance support: Bilateral upper extremity supported, During functional activity Standing balance-Leahy Scale: Poor Standing balance comment: relies on UE support for balance and NWB RLE                           ADL either performed or assessed with clinical judgement   ADL Overall ADL's : Needs assistance/impaired Eating/Feeding: Independent   Grooming: Set up;Sitting;Wash/dry face   Upper Body Bathing: Set up;Sitting   Lower Body Bathing: Minimal assistance;Sitting/lateral leans   Upper Body Dressing : Set up;Sitting  Lower Body Dressing: Moderate assistance;Sitting/lateral leans;Sit to/from stand;Cueing for safety   Toilet Transfer: Min guard;Rolling walker (2 wheels);Stand-pivot Armed forces technical officer Details (indicate cue type and reason): To  recliner, impulsive to stand and needs cues for safety and cues for BUE reach back to control descent. Toileting- Clothing Manipulation and Hygiene: Minimal assistance;Sitting/lateral lean;Sit to/from stand       Functional mobility during ADLs: Min guard;Rolling walker (2 wheels);Minimal assistance       Vision   Vision Assessment?: No apparent visual deficits     Perception     Praxis      Pertinent Vitals/Pain Pain Assessment Pain Assessment: 0-10 Pain Score: 7  Pain Location: right foot Pain Descriptors / Indicators: Aching, Grimacing, Guarding Pain Intervention(s): Limited activity within patient's tolerance, Monitored during session, Premedicated before session, Repositioned     Hand Dominance Right   Extremity/Trunk Assessment Upper Extremity Assessment Upper Extremity Assessment: Overall WFL for tasks assessed       Cervical / Trunk Assessment Cervical / Trunk Assessment: Normal   Communication Communication Communication: No difficulties   Cognition                                       General Comments: Impulsive and quick moving.     General Comments       Exercises     Shoulder Instructions      Home Living Family/patient expects to be discharged to:: Inpatient rehab Living Arrangements: Alone Available Help at Discharge: Family;Friend(s);Available PRN/intermittently Type of Home: Apartment Home Access: Ramped entrance     Home Layout: One level     Bathroom Shower/Tub: Tub/shower unit;Curtain   Biochemist, clinical: Standard     Home Equipment: Rollator (4 wheels);Shower seat;Cane - single Barista (2 wheels)   Additional Comments: shower seat wobbly,      Prior Functioning/Environment Prior Level of Function : Independent/Modified Independent             Mobility Comments: Used cane or RW at times per pt ADLs Comments: Did own B/D        OT Problem List: Pain;Decreased activity  tolerance;Impaired balance (sitting and/or standing);Decreased knowledge of use of DME or AE      OT Treatment/Interventions: Self-care/ADL training;Therapeutic activities;Patient/family education;DME and/or AE instruction;Balance training    OT Goals(Current goals can be found in the care plan section) Acute Rehab OT Goals Patient Stated Goal: To goto Rehab before home. OT Goal Formulation: With patient Time For Goal Achievement: 04/28/22 Potential to Achieve Goals: Good ADL Goals Pt Will Perform Lower Body Bathing: sitting/lateral leans;sit to/from stand;with supervision;with set-up Pt Will Perform Lower Body Dressing: with set-up;with supervision;sitting/lateral leans;sit to/from stand (using AE if necessary) Pt Will Transfer to Toilet: with modified independence;bedside commode;ambulating Pt Will Perform Toileting - Clothing Manipulation and hygiene: with modified independence;sitting/lateral leans;sit to/from stand Pt Will Perform Tub/Shower Transfer: tub bench;with supervision (Simulated if needed for pt education)  OT Frequency: Min 3X/week    Co-evaluation              AM-PAC OT "6 Clicks" Daily Activity     Outcome Measure Help from another person eating meals?: None Help from another person taking care of personal grooming?: A Little Help from another person toileting, which includes using toliet, bedpan, or urinal?: A Lot Help from another person bathing (including washing, rinsing, drying)?: A Little Help from another person to put on and  taking off regular upper body clothing?: A Little Help from another person to put on and taking off regular lower body clothing?: A Lot 6 Click Score: 17   End of Session Equipment Utilized During Treatment: Rolling walker (2 wheels) Nurse Communication: Mobility status;Weight bearing status  Activity Tolerance: Patient tolerated treatment well Patient left: in chair;with call bell/phone within reach;with chair alarm set  OT  Visit Diagnosis: Unsteadiness on feet (R26.81);Pain Pain - Right/Left: Right Pain - part of body: Ankle and joints of foot                Time: 1117-1140 OT Time Calculation (min): 23 min Charges:  OT General Charges $OT Visit: 1 Visit OT Evaluation $OT Eval Moderate Complexity: 1 Mod (Earlier OT visit with pt education/encouragement and Q&A for pt questions.)  Anderson Malta, OT Acute Rehab Services Office: [phone] 04/14/2022  Julien Girt 04/14/2022, 11:51 AM

## 2022-04-14 NOTE — Progress Notes (Signed)
Physical Therapy Treatment Patient Details Name: Katherine Herrera MRN: [id number] DOB: 1966/02/13 Today's Date: 04/14/2022   History of Present Illness Patient 56 year old female admitted 6/9 due to worsening right foot wound, concern for worsening osteomyelitis. Pt underwent I&D and wound VAC placement right foot on 6/11.  PMH: type 2 diabetes, history of childhood seizures, chronic pain syndrome, hyperlipidemia, hypothyroidism    PT Comments    Pt was seen for mobility on RW to stand and pivot to the chair from the bed, after initially being reluctant to move.  Talked with her about the persistence of pain being as likely tomorrow and she agreed to get OOB.  Pt is appropriate for rehab placement in SNF due to her significant pain and need for wound management, and will have time to work on her deficits as well as coordinate care for home.  Pt is likely to make fast progress with SNF, and will then be able to return home quickly.  Has more limited home help, and the therapy plan is to progress her as far as possible to get her more independent in rehab setting.  Recommendations for follow up therapy are one component of a multi-disciplinary discharge planning process, led by the attending physician.  Recommendations may be updated based on patient status, additional functional criteria and insurance authorization.  Follow Up Recommendations  Skilled nursing-short term rehab (<3 hours/day)     Assistance Recommended at Discharge Intermittent Supervision/Assistance  Patient can return home with the following A little help with walking and/or transfers;A little help with bathing/dressing/bathroom;Assistance with cooking/housework;Assist for transportation;Help with stairs or ramp for entrance   Equipment Recommendations  BSC/3in1;Wheelchair (measurements PT);Wheelchair cushion (measurements PT)    Recommendations for Other Services       Precautions / Restrictions Precautions Precautions:  Fall Precaution Comments: Wound VAC right foot Restrictions Weight Bearing Restrictions: Yes RLE Weight Bearing: Non weight bearing     Mobility  Bed Mobility Overal bed mobility: Modified Independent                  Transfers Overall transfer level: Needs assistance Equipment used: Rolling walker (2 wheels) Transfers: Sit to/from Stand, Bed to chair/wheelchair/BSC Sit to Stand: Min guard   Step pivot transfers: Min assist       General transfer comment: reminders for using UE's to manage safety with the transition    Ambulation/Gait               General Gait Details: did not attempt due to pt declining over pain   Stairs             Wheelchair Mobility    Modified Rankin (Stroke Patients Only)       Balance Overall balance assessment: Needs assistance Sitting-balance support: Feet supported Sitting balance-Leahy Scale: Good Sitting balance - Comments: pt is on EOB and able to use just LLE to steady herself   Standing balance support: Bilateral upper extremity supported, During functional activity Standing balance-Leahy Scale: Poor                              Cognition Arousal/Alertness: Awake/alert Behavior During Therapy: WFL for tasks assessed/performed Overall Cognitive Status: Within Functional Limits for tasks assessed                                 General Comments: pt reporting she puts herself  on the bedside commode        Exercises      General Comments General comments (skin integrity, edema, etc.): pt has a clean bandage on her R foot with wound vac in place and intact dressing      Pertinent Vitals/Pain Pain Assessment Pain Assessment: 0-10 Pain Score: 7  Pain Location: right foot Pain Descriptors / Indicators: Guarding, Grimacing, Aching Pain Intervention(s): Limited activity within patient's tolerance, Monitored during session, Premedicated before session, Repositioned    Home  Living Family/patient expects to be discharged to:: Inpatient rehab Living Arrangements: Alone Available Help at Discharge: Family;Friend(s);Available PRN/intermittently Type of Home: Apartment Home Access: Ramped entrance       Home Layout: One level Home Equipment: Rollator (4 wheels);Shower seat;Cane - single Barista (2 wheels) Additional Comments: shower seat wobbly,    Prior Function            PT Goals (current goals can now be found in the care plan section) Acute Rehab PT Goals Patient Stated Goal: go home    Frequency    Min 3X/week      PT Plan Current plan remains appropriate    Co-evaluation              AM-PAC PT "6 Clicks" Mobility   Outcome Measure  Help needed turning from your back to your side while in a flat bed without using bedrails?: None Help needed moving from lying on your back to sitting on the side of a flat bed without using bedrails?: None Help needed moving to and from a bed to a chair (including a wheelchair)?: A Little Help needed standing up from a chair using your arms (e.g., wheelchair or bedside chair)?: A Little Help needed to walk in hospital room?: A Lot Help needed climbing 3-5 steps with a railing? : Total 6 Click Score: 17    End of Session Equipment Utilized During Treatment: Gait belt;Other (comment) (R foot wound vac) Activity Tolerance: Patient limited by fatigue;Patient limited by pain Patient left: in chair;with call bell/phone within reach;with chair alarm set Nurse Communication: Mobility status;Weight bearing status;Precautions PT Visit Diagnosis: Unsteadiness on feet (R26.81);Muscle weakness (generalized) (M62.81);Pain Pain - Right/Left: Right Pain - part of body: Ankle and joints of foot     Time: 1115-1140 PT Time Calculation (min) (ACUTE ONLY): 25 min  Charges:  $Therapeutic Activity: 23-37 mins   Ramond Dial 04/14/2022, 1:45 PM  Mee Hives, PT PhD Acute Rehab Dept. Number: [place] [facility] [place]

## 2022-04-14 NOTE — Progress Notes (Signed)
PROGRESS NOTE    Katherine Herrera  VHQ:[id number] DOB: April 04, 1966 DOA: 04/08/2022 PCP: Cassandria Anger, MD    Chief Complaint  Patient presents with   Foot Pain    Infection     Brief Narrative:  Patient 56 year old female with history of type 2 diabetes, history of childhood seizures, chronic pain syndrome, hyperlipidemia, hypothyroidism, chronic osteomyelitis of the right foot who was sent to the ED from wound care center for IV antibiotics due to worsening right foot wound, concern for worsening osteomyelitis.  Patient initially placed on IV antibiotics.  Admitted.  Podiatry consulted.    Assessment & Plan:  Principal Problem:   Osteomyelitis of foot ([place]) Active Problems:   AKI (acute kidney injury) ([place])   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)   Essential hypertension   Hypothyroidism   Pure hypercholesterolemia   Anxiety disorder   Chronic pain    Assessment and Plan: * Osteomyelitis of foot ([place]) 56 year old female with known chronic osteomyelitis of right 5th metatarsal in setting of T2DM who presented to ED for worsening wound and IV abx from wound care  -blood cultures negative x5 days., lactic acid 1.1  -no criteria for sepsis -Patient started empirically on IV vancomycin, IV cefepime, IV Flagyl.   -ABIs checked in 12/2021 and wnl  -MRI in 02/2022: Chronic wound at the lateral aspect of the fifth TMT joint with underlying chronic osteomyelitis of the cuboid and fourth and fifth metatarsal bases. No abscess. -podiatry consulted-Dr. Sherryle Lis assessed the patient and patient went to the OR had cultures of biopsies done in addition to irrigation and debridement as well as implantation of antibiotic beads and wound VAC  04/10/2022.   -IV antibiotics were initially held 04/09/2022 until biopsies and cultures were obtained which was done.  -Cultures showing Staphylococcus simulans. -Biopsy with no malignancy identified, gangrenous necrosis of skin and  subcutis with focal acute inflammation and surface bacterial overgrowth. -Patient with staged procedure, per podiatry and return to the OR, 04/13/2022 for soft tissue reconstruction. -Resumed IV cefepime and IV vancomycin postoperatively.   -Continue IV Flagyl.   -ID consulted and IV cefepime initially narrowed to IV Rocephin and patient maintained on IV Flagyl and IV vancomycin. -Antibiotics transition to oral Augmentin per ID who are recommending 3-week duration from last visit to the OR with end of treatment 05/03/2022.   -Outpatient follow-up has been set up with ID, Dr. Candiss Norse on 05/16/2022.   -Patient nonweightbearing to the right lower extremity.   -Patient tolerating oral Augmentin.   -We will need SNF placement.   -Wound VAC changes every Monday Wednesday Friday per podiatry. -ID and podiatry following and I appreciate the input and recommendations.    AKI (acute kidney injury) ([place]) Renal function fluctuates. AKI vs. Baseline CKD Bolus in ED and patient placed on IV fluids.   -Renal function improved. Strict I/O Hold nephrotoxic drugs Continue to hold baclofen and lisinopril.  -IV fluids.  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) A1C in 10/2021 was 7.7 -Repeat hemoglobin A1c 7.5 (04/10/2022). -Patient noted to be hypoglycemic on admission likely secondary to acute infection, no signs of DKA. Osteomyelitis likely driving sugar uP. -Patient on 70/30 which has been discontinued and patient initially placed on long-acting insulin of Semglee 20 units in addition to SSI.  -Once no further procedures anticipated we will place back on home regimen 70/30 tomorrow. -CBG noted to be 464 last night and patient received SSI. -Increase Semglee to 35 units  daily. -Increase meal coverage NovoLog to 9 units 3 times daily. -SSI. -Diabetes coordinator following.   Essential hypertension -Blood pressure currently stable.   -Lisinopril on hold secondary to AKI.    -Follow.  Hypothyroidism TSH wnl in 08/2021 Continue home synthroid   Pure hypercholesterolemia Continue zetia. Statin intolerance.  Recent LDL of 135   Anxiety disorder Continue prozac and klonopin   Chronic pain Continue home oxycodone.  pmp website reviewed and fills correctly   COPD (chronic obstructive pulmonary disease) (HCC)-resolved as of 04/08/2022 No signs of exacerbation          DVT prophylaxis: Lovenox Code Status: Full Family Communication: Updated patient.  No family at bedside. Disposition: SNF  Status is: Inpatient Remains inpatient appropriate because: Severity of illness.   Consultants:  Podiatry: Dr. Sherryle Lis 04/09/2022 ID: Dr. Candiss Norse 04/11/2022  Procedures:  Plain films right foot 04/08/2022 Irrigation and debridement right foot/application of wound VAC/placement of antibiotic beads per Dr. Sherryle Lis, podiatry 04/10/2022. Irrigation and debridement and wound right lower extremity with application of Integra and negative pressure wound therapy by Dr. Posey Pronto 04/13/2022  Antimicrobials:  IV cefepime 04/08/2022>>>> 04/09/2022 IV Flagyl 04/08/2022>>>> 04/13/2022 IV vancomycin 04/08/2022>>>> 04/09/2022 IV vancomycin 04/10/2022>>>> IV cefepime 04/10/2022>>>>>> 04/11/2022 IV Rocephin 04/11/2022>>>>> 04/13/2022 Augmentin 04/13/2022>>>> 05/04/2022   Subjective: Sitting up in bed.  No chest pain.  No shortness of breath.  No abdominal pain.  States orthopedic doctor states nonweightbearing to the right lower extremity and needs to go to a SNF.   Objective: Vitals:   04/14/22 0024 04/14/22 0424 04/14/22 0828 04/14/22 1708  BP: 135/84 (!) 162/90 124/63 111/69  Pulse: 91 88 78 74  Resp: '17 18 18 18  '$ Temp: 98.5 F (36.9 C) 97.6 F (36.4 C) 98 F (36.7 C) 97.8 F (36.6 C)  TempSrc:  Oral Oral Oral  SpO2: 92% 100% 94% 96%  Weight:      Height:        Intake/Output Summary (Last 24 hours) at 04/14/2022 1859 Last data filed at 04/14/2022 1647 Gross per 24 hour   Intake 1495.83 ml  Output 500 ml  Net 995.83 ml   Filed Weights   04/08/22 1127 04/13/22 1058  Weight: 98.4 kg 98.4 kg    Examination:  General exam: NAD. Respiratory system: CTA B.  No wheezes, no crackles, no rhonchi.  Fair air movement.  Speaking in full sentences.  Cardiovascular system: Regular rate rhythm no murmurs rubs or gallops.  No JVD.  No lower extremity edema.   Gastrointestinal system: Abdomen soft, nontender, nondistended, positive bowel sounds.  No rebound.  No guarding.  Central nervous system: Alert and oriented. No focal neurological deficits. Extremities: Right foot bandaged with postop bandage, wound VAC noted.  Skin: No rashes, lesions or ulcers Psychiatry: Judgement and insight appear normal. Mood & affect appropriate.          Data Reviewed:   CBC: Recent Labs  Lab 04/11/22 0657 04/12/22 0037 04/13/22 0012 04/13/22 2238 04/14/22 0911  WBC 8.4 9.1 11.5* 16.0* 11.4*  NEUTROABS 5.6 5.8 7.4 12.4* 7.8*  HGB 13.7 14.8 14.9 15.1* 14.9  HCT 41.6 45.9 46.1* 46.7* 44.9  MCV 87.6 85.5 84.9 85.1 84.2  PLT 263 277 299 340 371    Basic Metabolic Panel: Recent Labs  Lab 04/11/22 0657 04/12/22 0037 04/13/22 0012 04/13/22 2238 04/14/22 0911  NA 135 135 135 131* 133*  K 4.8 4.2 4.4 6.1* 4.6  CL 98 99 96* 94* 98  CO2 '27 26 30 25 '$ 25  GLUCOSE 249* 193* 209* 428*  426* 233*  BUN '16 16 17 '$ 27* 23*  CREATININE 0.99 1.01* 1.08* 1.56* 1.03*  CALCIUM 8.7* 9.1 9.0 9.0 8.9    GFR: Estimated Creatinine Clearance: 76.2 mL/min (A) (by C-G formula based on SCr of 1.03 mg/dL (H)).  Liver Function Tests: Recent Labs  Lab 04/08/22 1220  AST 12*  ALT 12  ALKPHOS 96  BILITOT 0.4  PROT 7.2  ALBUMIN 3.2*    CBG: Recent Labs  Lab 04/13/22 1627 04/13/22 2223 04/14/22 0830 04/14/22 1224 04/14/22 1711  GLUCAP 290* 464* 263* 198* 108*     Recent Results (from the past 240 hour(s))  Blood culture (routine x 2)     Status: None   Collection  Time: 04/08/22 12:20 PM   Specimen: BLOOD  Result Value Ref Range Status   Specimen Description BLOOD LEFT ANTECUBITAL  Final   Special Requests   Final    BOTTLES DRAWN AEROBIC AND ANAEROBIC Blood Culture results may not be optimal due to an inadequate volume of blood received in culture bottles   Culture   Final    NO GROWTH 5 DAYS Performed at [facility], [place] [address], [place], [place] [address]    Report Status 04/13/2022 FINAL  Final  Blood culture (routine x 2)     Status: None   Collection Time: 04/08/22  4:30 PM   Specimen: BLOOD LEFT HAND  Result Value Ref Range Status   Specimen Description BLOOD LEFT HAND  Final   Special Requests   Final    BOTTLES DRAWN AEROBIC AND ANAEROBIC Blood Culture adequate volume   Culture   Final    NO GROWTH 5 DAYS Performed at [facility], [place] [address], [place], [place] [address]    Report Status 04/13/2022 FINAL  Final  Surgical PCR screen     Status: None   Collection Time: 04/09/22  7:20 PM   Specimen: Nasal Mucosa; Nasal Swab  Result Value Ref Range Status   MRSA, PCR NEGATIVE NEGATIVE Final   Staphylococcus aureus NEGATIVE NEGATIVE Final    Comment: (NOTE) The Xpert SA Assay (FDA approved for NASAL specimens in patients 27 years of age and older), is one component of a comprehensive surveillance program. It is not intended to diagnose infection nor to guide or monitor treatment. Performed at [facility], [place] [address], [place], [place] [address]   Aerobic/Anaerobic Culture w Gram Stain (surgical/deep wound)     Status: None (Preliminary result)   Collection Time: 04/10/22 10:31 AM   Specimen: Wound  Result Value Ref Range Status   Specimen Description WOUND  Final   Special Requests RIGHT FOOT  Final   Gram Stain   Final    FEW WBC PRESENT, PREDOMINANTLY MONONUCLEAR RARE GRAM POSITIVE COCCI Performed at [facility], [address], [place], [place] [address]    Culture   Final    RARE  STAPHYLOCOCCUS SIMULANS NO ANAEROBES ISOLATED; CULTURE IN PROGRESS FOR 5 DAYS    Report Status PENDING  Incomplete   Organism ID, Bacteria STAPHYLOCOCCUS SIMULANS  Final      Susceptibility   Staphylococcus simulans - MIC*    CIPROFLOXACIN <=0.5 SENSITIVE Sensitive     ERYTHROMYCIN >=8 RESISTANT Resistant     GENTAMICIN <=0.5 SENSITIVE Sensitive     OXACILLIN <=0.25 SENSITIVE Sensitive     TETRACYCLINE >=16 RESISTANT Resistant     VANCOMYCIN <=0.5 SENSITIVE Sensitive  TRIMETH/SULFA <=10 SENSITIVE Sensitive     CLINDAMYCIN >=8 RESISTANT Resistant     RIFAMPIN <=0.5 SENSITIVE Sensitive     Inducible Clindamycin NEGATIVE Sensitive     * RARE STAPHYLOCOCCUS SIMULANS  Culture, fungus without smear     Status: None (Preliminary result)   Collection Time: 04/10/22 10:31 AM   Specimen: Tissue; Other  Result Value Ref Range Status   Specimen Description TISSUE  Final   Special Requests NONE  Final   Culture   Final    NO FUNGUS ISOLATED AFTER 2 DAYS Performed at [facility], [address] [place], [place] [address]    Report Status PENDING  Incomplete         Radiology Studies: No results found.      Scheduled Meds:  amoxicillin-clavulanate  1 tablet Oral Q12H   bisacodyl  10 mg Oral QHS   docusate sodium  100 mg Oral QHS   enoxaparin (LOVENOX) injection  40 mg Subcutaneous Q24H   ezetimibe  10 mg Oral Daily   FLUoxetine  10 mg Oral Daily   gabapentin  800 mg Oral TID   insulin aspart  0-5 Units Subcutaneous QHS   insulin aspart  0-9 Units Subcutaneous TID WC   insulin aspart  9 Units Subcutaneous TID WC   insulin glargine-yfgn  35 Units Subcutaneous Daily   lamoTRIgine  25 mg Oral BID   levothyroxine  75 mcg Oral QAC breakfast   mupirocin ointment  1 application  Nasal BID   rOPINIRole  0.5 mg Oral Daily   Continuous Infusions:  sodium chloride 125 mL/hr at 04/14/22 1647      LOS: 6 days    Time spent: 35 minutes.    Irine Seal,  MD [facility]   To contact the attending provider between 7A-7P or the covering provider during after hours 7P-7A, please log into the web site [email or web address] and access using universal [place] password for that web site. If you do not have the password, please call the hospital operator.  04/14/2022, 6:59 PM

## 2022-04-14 NOTE — TOC Initial Note (Signed)
Transition of Care [facility]) - Initial/Assessment Note    Patient Details  Name: Katherine Herrera MRN: [id number] Date of Birth: Apr 02, 1966  Transition of Care [facility]) CM/SW Contact:    Coralee Pesa, [place] Phone Number: 04/14/2022, 7:32 PM  Clinical Narrative:                 CSW met with pt at bedside to discuss SNF recommendation. Pt is agreeable and notes she has never been before. She requests to be as near her home as possible, because she wants to be close to family.  She declines need for CSW to speak with family. Medicare.gov info provided. TOC will continue to follow for DC needs. Expected Discharge Plan: Skilled Nursing Facility Barriers to Discharge: Ship broker, Continued Medical Work up, SNF Pending bed offer   Patient Goals and CMS Choice Patient states their goals for this hospitalization and ongoing recovery are:: Pt would like to be near her family. CMS Medicare.gov Compare Post Acute Care list provided to:: Patient Choice offered to / list presented to : Patient  Expected Discharge Plan and Services Expected Discharge Plan: [place] Acute Care Choice: [place] arrangements for the past 2 months: Single Family Home                                      Prior Living Arrangements/Services Living arrangements for the past 2 months: Single Family Home Lives with:: Self Patient language and need for interpreter reviewed:: Yes Do you feel safe going back to the place where you live?: Yes      Need for Family Participation in Patient Care: No (Comment) Care giver support system in place?: Yes (comment)   Criminal Activity/Legal Involvement Pertinent to Current Situation/Hospitalization: No - Comment as needed  Activities of Daily Living Home Assistive Devices/Equipment: None ADL Screening (condition at time of admission) Patient's cognitive ability adequate to safely complete daily activities?:  Yes Is the patient deaf or have difficulty hearing?: No Does the patient have difficulty seeing, even when wearing glasses/contacts?: No Does the patient have difficulty concentrating, remembering, or making decisions?: No Patient able to express need for assistance with ADLs?: Yes Does the patient have difficulty dressing or bathing?: No Independently performs ADLs?: Yes (appropriate for developmental age) Does the patient have difficulty walking or climbing stairs?: No Weakness of Legs: None Weakness of Arms/Hands: None  Permission Sought/Granted Permission sought to share information with : Family Supports Permission granted to share information with : No              Emotional Assessment Appearance:: Appears stated age Attitude/Demeanor/Rapport: Engaged Affect (typically observed): Appropriate Orientation: : Oriented to Self, Oriented to Place, Oriented to  Time, Oriented to Situation Alcohol / Substance Use: Not Applicable Psych Involvement: No (comment)  Admission diagnosis:  Osteomyelitis of foot ([place]) [M86.9] Osteomyelitis of right foot, unspecified type ([place]) [M86.9] Patient Active Problem List   Diagnosis Date Noted   Osteomyelitis of foot ([place]) 04/08/2022   Finger pain, right 02/23/2022   Depression 02/23/2022   Thyroid nodule 09/09/2021   Statin myopathy 08/06/2021   Essential hypertension 10/09/2020   Pure hypercholesterolemia 10/09/2020   Chronic osteomyelitis involving right ankle and foot ([place])    Cellulitis of right lower leg 01/10/2020   Cellulitis 01/02/2020   Peroneal tendonitis, right    Capsulitis of metatarsophalangeal (MTP) joint of  right foot    Exostosis of bone of foot    Polyneuropathy due to type 2 diabetes mellitus ([place]) 10/01/2018   Chronic pain 09/29/2018   Cellulitis of left lower leg    Cerumen impaction 08/02/2017   Constipation due to pain medication 05/22/2017   AKI (acute kidney injury) ([place]) 05/03/2017   Charcot foot due to  diabetes mellitus ([place]) 04/23/2017   Pain from implanted hardware 01/06/2016   Cellulitis and abscess of toe of right foot 07/24/2015   DOE (dyspnea on exertion) 06/16/2015   Chest pain, atypical 06/16/2015   Family history of musculoskeletal disease 03/12/2015   Non-compliant behavior 12/24/2014   Callus of foot 03/25/2014   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   Metatarsal deformity, left 09/03/2013   Pes cavus 10/13/2012   Acquired tight Achilles tendon 10/13/2012   Paronychia 06/14/2012   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) 03/19/2012   Allergic rhinitis 03/19/2012   TTS (tarsal tunnel syndrome)    ARTHRALGIA 01/04/2010   CBC, ABNORMAL 05/01/2009   B12 deficiency 01/05/2009   GOUT 01/05/2009   Anxiety disorder 07/28/2008   TOBACCO USE DISORDER/SMOKER-SMOKING CESSATION DISCUSSED 04/21/2008   FOOT PAIN 04/21/2008   Diabetic neuropathy ([place]) 08/27/2007   LOW BACK PAIN 08/27/2007   Dyslipidemia 08/20/2007   Hypothyroidism 05/22/2007   PCP:  Cassandria Anger, MD Pharmacy:   [id number] [id number]: [phone]: [phone]    Social Determinants of Health (SDOH) Interventions    Readmission Risk Interventions    01/15/2020   10:17 AM  Readmission Risk Prevention Plan  Transportation Screening Complete  PCP or Specialist Appt within 5-7 Days Complete  Home Care Screening Complete  Medication Review (RN CM) Complete

## 2022-04-14 NOTE — NC FL2 (Signed)
[place] LEVEL OF CARE SCREENING TOOL     IDENTIFICATION  Patient Name: Katherine Herrera Birthdate: February 19, 1966 Sex: female Admission Date (Current Location): 04/08/2022  [facility] and [place] Number:  Herbalist and Address:  [facility], [place] [address] [place], [place] [address]      Provider Number: [id number]  Attending Physician Name and Address:  Eugenie Filler, MD  Relative Name and Phone Number:  Jeannene Patella [phone]    Current Level of Care: Hospital Recommended Level of Care: [place] Prior Approval Number:    Date Approved/Denied:   PASRR Number: [id number]  Discharge Plan: SNF    Current Diagnoses: Patient Active Problem List   Diagnosis Date Noted   Osteomyelitis of foot ([place]) 04/08/2022   Finger pain, right 02/23/2022   Depression 02/23/2022   Thyroid nodule 09/09/2021   Statin myopathy 08/06/2021   Essential hypertension 10/09/2020   Pure hypercholesterolemia 10/09/2020   Chronic osteomyelitis involving right ankle and foot ([place])    Cellulitis of right lower leg 01/10/2020   Cellulitis 01/02/2020   Peroneal tendonitis, right    Capsulitis of metatarsophalangeal (MTP) joint of right foot    Exostosis of bone of foot    Polyneuropathy due to type 2 diabetes mellitus ([place]) 10/01/2018   Chronic pain 09/29/2018   Cellulitis of left lower leg    Cerumen impaction 08/02/2017   Constipation due to pain medication 05/22/2017   AKI (acute kidney injury) ([place]) 05/03/2017   Charcot foot due to diabetes mellitus ([place]) 04/23/2017   Pain from implanted hardware 01/06/2016   Cellulitis and abscess of toe of right foot 07/24/2015   DOE (dyspnea on exertion) 06/16/2015   Chest pain, atypical 06/16/2015   Family history of musculoskeletal disease 03/12/2015   Non-compliant behavior 12/24/2014   Callus of foot 03/25/2014   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   Metatarsal deformity, left 09/03/2013    Pes cavus 10/13/2012   Acquired tight Achilles tendon 10/13/2012   Paronychia 06/14/2012   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) 03/19/2012   Allergic rhinitis 03/19/2012   TTS (tarsal tunnel syndrome)    ARTHRALGIA 01/04/2010   CBC, ABNORMAL 05/01/2009   B12 deficiency 01/05/2009   GOUT 01/05/2009   Anxiety disorder 07/28/2008   TOBACCO USE DISORDER/SMOKER-SMOKING CESSATION DISCUSSED 04/21/2008   FOOT PAIN 04/21/2008   Diabetic neuropathy ([place]) 08/27/2007   LOW BACK PAIN 08/27/2007   Dyslipidemia 08/20/2007   Hypothyroidism 05/22/2007    Orientation RESPIRATION BLADDER Height & Weight     Self, Time, Situation, Place  Normal Continent Weight: 216 lb 14.9 oz (98.4 kg) Height:  '5\' 9"'$  (175.3 cm)  BEHAVIORAL SYMPTOMS/MOOD NEUROLOGICAL BOWEL NUTRITION STATUS      Continent Diet (See DC summary)  AMBULATORY STATUS COMMUNICATION OF NEEDS Skin   Extensive Assist Verbally Surgical wounds (R foot incision)                       Personal Care Assistance Level of Assistance  Bathing, Feeding, Dressing Bathing Assistance: Limited assistance Feeding assistance: Independent Dressing Assistance: Limited assistance     Functional Limitations Info  Sight, Hearing, Speech Sight Info: Adequate Hearing Info: Adequate Speech Info: Adequate    SPECIAL CARE FACTORS FREQUENCY  PT (By licensed PT), OT (By licensed OT)     PT Frequency: 5x week OT Frequency: 5x week            Contractures Contractures Info: Not present  Additional Factors Info  Code Status, Allergies, Psychotropic, Insulin Sliding Scale Code Status Info: Full Allergies Info: Cephalexin   Demerol  (Meperidine Hcl)   Lovastatin   Metformin And Related   Sulfamethoxazole-trimethoprim   Crestor (Rosuvastatin Calcium)   Hydromorphone   Niacin   Other   Hydromorphone Hcl   Paroxetine Psychotropic Info: Fluoxetine Insulin Sliding Scale Info: Insulin Aspart (Novolog) 0-5 U @ bedtime, 0-9 U 3x  daily w/ meals, 9U 3x daily w/ meals; Insulin Glargine- YFGN (Semglee) 35 U daily.       Current Medications (04/14/2022):  This is the current hospital active medication list Current Facility-Administered Medications  Medication Dose Route Frequency Provider Last Rate Last Admin   0.9 %  sodium chloride infusion   Intravenous Continuous Eugenie Filler, MD       acetaminophen (TYLENOL) tablet 650 mg  650 mg Oral Q6H PRN Felipa Furnace, DPM       Or   acetaminophen (TYLENOL) suppository 650 mg  650 mg Rectal Q6H PRN Felipa Furnace, DPM       amoxicillin-clavulanate (AUGMENTIN) 875-125 MG per tablet 1 tablet  1 tablet Oral Q12H Felipa Furnace, DPM   1 tablet at 04/14/22 0858   bisacodyl (DULCOLAX) EC tablet 10 mg  10 mg Oral QHS Felipa Furnace, DPM   10 mg at 04/13/22 2109   clonazePAM (KLONOPIN) tablet 0.5 mg  0.5 mg Oral BID PRN Felipa Furnace, DPM   0.5 mg at 04/13/22 2316   docusate sodium (COLACE) capsule 100 mg  100 mg Oral QHS Felipa Furnace, DPM   100 mg at 04/13/22 2109   enoxaparin (LOVENOX) injection 40 mg  40 mg Subcutaneous Q24H Pham, Minh Q, RPH-CPP   40 mg at 04/14/22 0756   ezetimibe (ZETIA) tablet 10 mg  10 mg Oral Daily Felipa Furnace, DPM   10 mg at 04/14/22 0857   FLUoxetine (PROZAC) capsule 10 mg  10 mg Oral Daily Felipa Furnace, DPM   10 mg at 04/14/22 0857   gabapentin (NEURONTIN) capsule 800 mg  800 mg Oral TID Felipa Furnace, DPM   800 mg at 04/14/22 1652   insulin aspart (novoLOG) injection 0-5 Units  0-5 Units Subcutaneous QHS Felipa Furnace, DPM   3 Units at 04/12/22 2032   insulin aspart (novoLOG) injection 0-9 Units  0-9 Units Subcutaneous TID WC Felipa Furnace, DPM   2 Units at 04/14/22 1240   insulin aspart (novoLOG) injection 9 Units  9 Units Subcutaneous TID WC Eugenie Filler, MD   9 Units at 04/14/22 1729   insulin glargine-yfgn (SEMGLEE) injection 35 Units  35 Units Subcutaneous Daily Eugenie Filler, MD   35 Units at 04/14/22 1241   lamoTRIgine  (LAMICTAL) tablet 25 mg  25 mg Oral BID Felipa Furnace, DPM   25 mg at 04/14/22 0857   levothyroxine (SYNTHROID) tablet 75 mcg  75 mcg Oral QAC breakfast Felipa Furnace, DPM   75 mcg at 04/14/22 0730   loratadine (CLARITIN) tablet 10 mg  10 mg Oral Daily PRN Felipa Furnace, DPM       morphine (PF) 2 MG/ML injection 1 mg  1 mg Intravenous Q3H PRN Felipa Furnace, DPM   1 mg at 04/14/22 1429   mupirocin ointment (BACTROBAN) 2 % 1 application   1 application  Nasal BID Felipa Furnace, DPM   1 application  at 16/10/96 0454   oxyCODONE (Oxy  IR/ROXICODONE) immediate release tablet 15 mg  15 mg Oral Q6H PRN Felipa Furnace, DPM   15 mg at 04/14/22 0902   rOPINIRole (REQUIP) tablet 0.5 mg  0.5 mg Oral Daily Felipa Furnace, DPM   0.5 mg at 04/14/22 9449     Discharge Medications: Please see discharge summary for a list of discharge medications.  Relevant Imaging Results:  Relevant Lab Results:   Additional Information SS# [place], [id number]

## 2022-04-14 NOTE — Progress Notes (Signed)
Inpatient Rehab Admissions Coordinator:   Per OT recommendations pt was screened for CIR by Shann Medal, PT, DPT.  Based on current payor trends, [facility] Medicare will not approve diagnosis for CIR admit.  Recommend alternative venues/settings for post-acute therapy be investigated.   Shann Medal, PT, DPT Admissions Coordinator [phone] 04/14/22  1:30 PM

## 2022-04-14 NOTE — Plan of Care (Signed)
  Problem: Fluid Volume: Goal: Ability to maintain a balanced intake and output will improve Outcome: Progressing   Problem: Metabolic: Goal: Ability to maintain appropriate glucose levels will improve Outcome: Progressing   Problem: Nutritional: Goal: Maintenance of adequate nutrition will improve Outcome: Progressing Goal: Progress toward achieving an optimal weight will improve Outcome: Progressing   Problem: Tissue Perfusion: Goal: Adequacy of tissue perfusion will improve Outcome: Progressing   Problem: Education: Goal: Knowledge of General Education information will improve Description: Including pain rating scale, medication(s)/side effects and non-pharmacologic comfort measures Outcome: Progressing

## 2022-04-15 ENCOUNTER — Ambulatory Visit: Payer: Medicare Other | Admitting: Podiatry

## 2022-04-15 ENCOUNTER — Other Ambulatory Visit: Payer: Self-pay | Admitting: Internal Medicine

## 2022-04-15 DIAGNOSIS — M86671 Other chronic osteomyelitis, right ankle and foot: Secondary | ICD-10-CM | POA: Diagnosis not present

## 2022-04-15 LAB — CBC WITH DIFFERENTIAL/PLATELET
Abs Immature Granulocytes: 0.04 10*3/uL (ref 0.00–0.07)
Basophils Absolute: 0.1 10*3/uL (ref 0.0–0.1)
Basophils Relative: 0 %
Eosinophils Absolute: 0.2 10*3/uL (ref 0.0–0.5)
Eosinophils Relative: 2 %
HCT: 42.1 % (ref 36.0–46.0)
Hemoglobin: 14 g/dL (ref 12.0–15.0)
Immature Granulocytes: 0 %
Lymphocytes Relative: 29 %
Lymphs Abs: 3.9 10*3/uL (ref 0.7–4.0)
MCH: 28.3 pg (ref 26.0–34.0)
MCHC: 33.3 g/dL (ref 30.0–36.0)
MCV: 85.1 fL (ref 80.0–100.0)
Monocytes Absolute: 0.9 10*3/uL (ref 0.1–1.0)
Monocytes Relative: 7 %
Neutro Abs: 8.3 10*3/uL — ABNORMAL HIGH (ref 1.7–7.7)
Neutrophils Relative %: 62 %
Platelets: 303 10*3/uL (ref 150–400)
RBC: 4.95 MIL/uL (ref 3.87–5.11)
RDW: 13.2 % (ref 11.5–15.5)
WBC: 13.4 10*3/uL — ABNORMAL HIGH (ref 4.0–10.5)
nRBC: 0 % (ref 0.0–0.2)

## 2022-04-15 LAB — BASIC METABOLIC PANEL
Anion gap: 8 (ref 5–15)
BUN: 17 mg/dL (ref 6–20)
CO2: 26 mmol/L (ref 22–32)
Calcium: 8.6 mg/dL — ABNORMAL LOW (ref 8.9–10.3)
Chloride: 105 mmol/L (ref 98–111)
Creatinine, Ser: 0.96 mg/dL (ref 0.44–1.00)
GFR, Estimated: >60 mL/min (ref >60)
Glucose, Bld: 116 mg/dL — ABNORMAL HIGH (ref 70–99)
Potassium: 4.5 mmol/L (ref 3.5–5.1)
Sodium: 139 mmol/L (ref 135–145)

## 2022-04-15 LAB — AEROBIC/ANAEROBIC CULTURE W GRAM STAIN (SURGICAL/DEEP WOUND)

## 2022-04-15 LAB — URINE CULTURE: Culture: NO GROWTH

## 2022-04-15 LAB — GLUCOSE, CAPILLARY
Glucose-Capillary: 96 mg/dL (ref 70–99)
Glucose-Capillary: 141 mg/dL — ABNORMAL HIGH (ref 70–99)
Glucose-Capillary: 185 mg/dL — ABNORMAL HIGH (ref 70–99)
Glucose-Capillary: 329 mg/dL — ABNORMAL HIGH (ref 70–99)

## 2022-04-15 MED ORDER — INSULIN ASPART PROT & ASPART (70-30 MIX) 100 UNIT/ML ~~LOC~~ SUSP
28.0000 [IU] | Freq: Two times a day (BID) | SUBCUTANEOUS | Status: DC
  Administered 2022-04-15 – 2022-04-16 (×3): 28 [IU] via SUBCUTANEOUS
  Filled 2022-04-15: qty 10

## 2022-04-15 NOTE — Progress Notes (Signed)
PROGRESS NOTE  Katherine Herrera  DOB: [birth year]  PCP: Cassandria Anger, MD XTK:[id number]  DOA: 04/08/2022  LOS: 7 days  Hospital Day: 8  Brief narrative: Katherine Herrera is a 56 y.o. female with PMH significant for gout, HTN, HLD, history of childhood seizures, chronic pain syndrome, chronic osteomyelitis of the right foot who was sent to the ED from wound care center for IV antibiotics due to worsening right foot wound, concern for worsening osteomyelitis. Patient initially placed on IV antibiotics.   Admitted to [facility].   Podiatry consulted.  See below for details  Subjective: Patient was seen and examined this afternoon.  Not in distress.  No new symptoms.   Principal Problem:   Osteomyelitis of foot ([place]) Active Problems:   AKI (acute kidney injury) ([place])   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 ([place])   Essential hypertension   Hypothyroidism   Pure hypercholesterolemia   Anxiety disorder   Chronic pain    Assessment and plan: Right foot osteomyelitis -6/11, patient underwent excisional debridement of the wound  -9/92, underwent application of Integra and wound VAC. -Per podiatry, patient will need wound VAC changes Monday Wednesday Friday. -Wound culture growing Staphylococcus stimulants. -ID following for antibiotics.  Initially improved with antibiotics.  Currently on oral Augmentin plan till 05/04/22  Type 2 diabetes mellitus Peripheral neuropathy -A1c 7.5 on 04/10/2022 -Home meds include 70/30 insulin -Currently on 28 units of insulin 70/30 twice a day, sliding scale insulin with Accu-Cheks. -Diabetes care coordinator consult appreciated. Recent Labs  Lab 04/14/22 1224 04/14/22 1711 04/14/22 2219 04/15/22 0826 04/15/22 1223  GLUCAP 198* 108* 210* 96 141*   AKI (acute kidney injury)  -Renal function improving on IV fluid -Continue to hold baclofen and lisinopril.  Recent Labs    03/16/22 1057 04/08/22 1220 04/09/22 0052 04/10/22 0132  04/11/22 0657 04/12/22 0037 04/13/22 0012 04/13/22 2238 04/14/22 0911 04/15/22 0243  BUN '17 18 18 19 16 16 17 '$ 27* 23* 17  CREATININE 1.18* 1.24* 1.08* 1.09* 0.99 1.01* 1.08* 1.56* 1.03* 0.96   Essential hypertension -Blood pressure currently stable.   -Lisinopril on hold secondary to AKI.   -Continue to monitor   Hypothyroidism -TSH wnl in 08/2021 -Continue home synthroid    Pure hypercholesterolemia -Continue zetia. Statin intolerance.  -Recent LDL of 135    Anxiety disorder -Continue prozac and klonopin    Chronic pain -Continue home oxycodone.   COPD (chronic obstructive pulmonary disease) -Respiratory status stable.  Goals of care   Code Status: Full Code    Mobility: Encourage ambulation  Skin assessment:     Nutritional status:  Body mass index is 32.04 kg/m.          Diet:  Diet Order             Diet Carb Modified Fluid consistency: Thin; Room service appropriate? Yes  Diet effective now                   DVT prophylaxis:  enoxaparin (LOVENOX) injection 40 mg Start: 04/14/22 0800   Antimicrobials: Augmentin Fluid: NS at 125 mill per hour.  I think we can stop NS at this time. Consultants: Podiatry Family Communication: None at bedside  Status is: Inpatient  Continue in-hospital care because: Pending placement Level of care: Med-Surg   Dispo: The patient is from: Home              Anticipated d/c is to: SNF  Patient currently is not medically stable to d/c.   Difficult to place patient No     Infusions:     Scheduled Meds:  amoxicillin-clavulanate  1 tablet Oral Q12H   bisacodyl  10 mg Oral QHS   docusate sodium  100 mg Oral QHS   enoxaparin (LOVENOX) injection  40 mg Subcutaneous Q24H   ezetimibe  10 mg Oral Daily   FLUoxetine  10 mg Oral Daily   gabapentin  800 mg Oral TID   insulin aspart  0-5 Units Subcutaneous QHS   insulin aspart  0-9 Units Subcutaneous TID WC   insulin aspart protamine- aspart   28 Units Subcutaneous BID WC   lamoTRIgine  25 mg Oral BID   levothyroxine  75 mcg Oral QAC breakfast   rOPINIRole  0.5 mg Oral Daily    PRN meds: acetaminophen **OR** acetaminophen, clonazePAM, loratadine, morphine injection, oxyCODONE   Antimicrobials: Anti-infectives (From admission, onward)    Start     Dose/Rate Route Frequency Ordered Stop   04/13/22 1045  amoxicillin-clavulanate (AUGMENTIN) 875-125 MG per tablet 1 tablet        1 tablet Oral Every 12 hours 04/13/22 0948 05/04/22 0959   04/11/22 1400  vancomycin (VANCOREADY) IVPB 1250 mg/250 mL  Status:  Discontinued        1,250 mg 166.7 mL/hr over 90 Minutes Intravenous Every 24 hours 04/10/22 1328 04/12/22 1107   04/11/22 1400  cefTRIAXone (ROCEPHIN) 2 g in sodium chloride 0.9 % 100 mL IVPB  Status:  Discontinued        2 g 200 mL/hr over 30 Minutes Intravenous Every 24 hours 04/11/22 1235 04/13/22 0948   04/10/22 1415  vancomycin (VANCOREADY) IVPB 2000 mg/400 mL        2,000 mg 200 mL/hr over 120 Minutes Intravenous  Once 04/10/22 1328 04/10/22 1745   04/10/22 1400  ceFEPIme (MAXIPIME) 2 g in sodium chloride 0.9 % 100 mL IVPB  Status:  Discontinued        2 g 200 mL/hr over 30 Minutes Intravenous Every 8 hours 04/10/22 1314 04/11/22 1235   04/10/22 1041  vancomycin (VANCOCIN) powder  Status:  Discontinued          As needed 04/10/22 1041 04/10/22 1054   04/09/22 1800  vancomycin (VANCOREADY) IVPB 1250 mg/250 mL  Status:  Discontinued        1,250 mg 166.7 mL/hr over 90 Minutes Intravenous Every 24 hours 04/08/22 1620 04/09/22 1723   04/08/22 1815  ceFEPIme (MAXIPIME) 2 g in sodium chloride 0.9 % 100 mL IVPB  Status:  Discontinued        2 g 200 mL/hr over 30 Minutes Intravenous Every 8 hours 04/08/22 1800 04/09/22 1723   04/08/22 1800  metroNIDAZOLE (FLAGYL) IVPB 500 mg  Status:  Discontinued        500 mg 100 mL/hr over 60 Minutes Intravenous Every 12 hours 04/08/22 1746 04/13/22 0948   04/08/22 1645  cefTRIAXone  (ROCEPHIN) 2 g in sodium chloride 0.9 % 100 mL IVPB        2 g 200 mL/hr over 30 Minutes Intravenous  Once 04/08/22 1630 04/08/22 1735   04/08/22 1630  vancomycin (VANCOREADY) IVPB 1500 mg/300 mL        1,500 mg 150 mL/hr over 120 Minutes Intravenous  Once 04/08/22 1619 04/08/22 2051       Objective: Vitals:   04/15/22 0513 04/15/22 0825  BP: 129/86 140/87  Pulse: 78 73  Resp: 18 17  Temp: 97.7 F (36.5 C) 98 F (36.7 C)  SpO2: 94% 97%    Intake/Output Summary (Last 24 hours) at 04/15/2022 1729 Last data filed at 04/15/2022 1502 Gross per 24 hour  Intake 2953.56 ml  Output 2000 ml  Net 953.56 ml   Filed Weights   04/08/22 1127 04/13/22 1058  Weight: 98.4 kg 98.4 kg   Weight change:  Body mass index is 32.04 kg/m.   Physical Exam: General exam: Pleasant, middle-aged Caucasian female. Skin: No rashes, lesions or ulcers. HEENT: Atraumatic, normocephalic, no obvious bleeding Lungs: Clear to auscultation bilaterally CVS: Regular rate and rhythm, normal GI/Abd soft, nontender, nondistended, bowel sound present CNS: Alert, awake, oriented x3 Psychiatry: Mood appropriate Extremities: No pedal edema, no calf tenderness  Data Review: I have personally reviewed the laboratory data and studies available.  F/u labs ordered Unresulted Labs (From admission, onward)     Start     Ordered   04/11/22 0932  Acid Fast Smear (AFB)  (AFB smear + Culture w reflexed sensitivities panel)  Add-on,   AD       See Hyperspace for full Linked Orders Report.   04/11/22 0931   04/11/22 0932  Acid Fast Culture with reflexed sensitivities  (AFB smear + Culture w reflexed sensitivities panel)  Add-on,   AD       See Hyperspace for full Linked Orders Report.   04/11/22 0931            Signed, Terrilee Croak, MD [facility] 04/15/2022

## 2022-04-15 NOTE — Inpatient Diabetes Management (Signed)
Inpatient Diabetes Program Recommendations  AACE/ADA: New Consensus Statement on Inpatient Glycemic Control   Target Ranges:  Prepandial:   less than 140 mg/dL      Peak postprandial:   less than 180 mg/dL (1-2 hours)      Critically ill patients:  140 - 180 mg/dL    Latest Reference Range & Units 04/14/22 08:30 04/14/22 12:24 04/14/22 17:11 04/14/22 22:19  Glucose-Capillary 70 - 99 mg/dL 263 (H) 198 (H) 108 (H) 210 (H)    Latest Reference Range & Units 04/15/22 02:43  Glucose 70 - 99 mg/dL 116 (H)   Review of Glycemic Control  Diabetes history: DM2 Outpatient Diabetes medications: 70/30 30 units with breakfast and lunch, 70/30 40 units with supper Current orders for Inpatient glycemic control: 70/30 30 units QAM, 70/30 40 units QPM, Novolog 0-9 units TID with meals, Novolog 0-5 units QHS  Inpatient Diabetes Program Recommendations:    Insulin: Please consider changing 70/30 to 28 units BID (would provide a total of 39.2 units for basal and 16.8 units for meal coverage per day).   NOTE: Patient was ordered Semglee 35 units daily, Novolog 9 units TID with meals for meal coverage, plus Novolog 0-9 units TID with meals,Novolog 0-5 units QHS and lab glucose 116 mg/dl today.  Patient takes 70/30 outpatient. Noted Semglee and Novolog meal cov discontinued yesterday evening and now ordered 70/30 30 units QAM and 40 units QPM (same as outpatient regimen).  Thanks, Barnie Alderman, RN, MSN, [place] Diabetes Coordinator Inpatient Diabetes Program [phone] (Team Pager from 8am to [place])

## 2022-04-15 NOTE — Progress Notes (Signed)
  Subjective:  Patient ID: Katherine Herrera, female    DOB: 1966-03-07,  MRN: [id number]  A 56 y.o. female presents with right foot osteomyelitis status post application of Integra and negative pressure wound therapy on 04/13/2022.  She states she is doing well.  Minimal pain.  VAC is functioning intact.  No nausea fever chills vomiting  Objective:   Vitals:   04/14/22 2232 04/15/22 0513  BP: 135/73 129/86  Pulse: 78 78  Resp: 20 18  Temp: 98.1 F (36.7 C) 97.7 F (36.5 C)  SpO2: 98% 94%   General AA&O x3. Normal mood and affect.  Vascular Dorsalis pedis and posterior tibial pulses 2/4 bilat. Brisk capillary refill to all digits. Pedal hair present.  Neurologic Epicritic sensation grossly intact.  Dermatologic Dressing clean dry and intact.  VAC is functioning well.  No signs of redness or cellulitis noted.  No calf pain.  Orthopedic: MMT 5/5 in dorsiflexion, plantarflexion, inversion, and eversion. Normal joint ROM without pain or crepitus.    Assessment & Plan:  Patient was evaluated and treated and all questions answered.  Right foot osteomyelitis status post excisional debridement of the wound with application of Integra and wound VAC postop day 2 -All questions and concerns were discussed with the patient in extensive detail -She will need wound VAC changes Monday Wednesday Friday -She will benefit from SNF placement -I will see her in clinic 1 week from discharge.  She is okay to be discharged from my standpoint. -Antibiotics per infectious disease -Nonweightbearing to the right lower extremity  Felipa Furnace, DPM  Accessible via secure chat for questions or concerns.

## 2022-04-15 NOTE — Progress Notes (Signed)
Physical Therapy Treatment Patient Details Name: Katherine Herrera MRN: [id number] DOB: 1965/12/29 Today's Date: 04/15/2022   History of Present Illness Patient 56 year old female admitted 6/9 due to worsening right foot wound, concern for worsening osteomyelitis. Pt underwent I&D and wound VAC placement right foot on 6/11.  PMH: type 2 diabetes, history of childhood seizures, chronic pain syndrome, hyperlipidemia, hypothyroidism    PT Comments    Pt was seen for mobility on RW with help to take steps and to practice safety with walker and transfers.  Pt is voicing her feeling of being fine to do quick bed to [facility] transfers, and talked with her about this concerning behavior.  Pt is not convinced but agreed to ask nursing to do any moving.  Follow up with her for all goals of acute PT and focus on standing balance, safety and gait quality.  Follow up as time and pt allow for her stay.   Recommendations for follow up therapy are one component of a multi-disciplinary discharge planning process, led by the attending physician.  Recommendations may be updated based on patient status, additional functional criteria and insurance authorization.  Follow Up Recommendations  Skilled nursing-short term rehab (<3 hours/day)     Assistance Recommended at Discharge Intermittent Supervision/Assistance  Patient can return home with the following A little help with walking and/or transfers;A little help with bathing/dressing/bathroom;Assistance with cooking/housework;Assist for transportation;Help with stairs or ramp for entrance   Equipment Recommendations  BSC/3in1;Wheelchair (measurements PT);Wheelchair cushion (measurements PT)    Recommendations for Other Services       Precautions / Restrictions Precautions Precautions: Fall Precaution Comments: Wound VAC right foot Restrictions Weight Bearing Restrictions: Yes RLE Weight Bearing: Non weight bearing     Mobility  Bed Mobility Overal bed  mobility: Modified Independent                  Transfers Overall transfer level: Needs assistance Equipment used: Rolling walker (2 wheels) Transfers: Sit to/from Stand, Bed to chair/wheelchair/BSC Sit to Stand: Min guard Stand pivot transfers: Min guard Step pivot transfers: Min assist       General transfer comment: using UE's on recliner arms once up    Ambulation/Gait Ambulation/Gait assistance: Min guard, Min assist Gait Distance (Feet): 20 Feet (5' x 4) Assistive device: Rolling walker (2 wheels), 1 person hand held assist   Gait velocity: reduced Gait velocity interpretation: <1.31 ft/sec, indicative of household ambulator   General Gait Details: hops on LLE with RW sliding on side of bed   Stairs             Wheelchair Mobility    Modified Rankin (Stroke Patients Only)       Balance Overall balance assessment: Needs assistance Sitting-balance support: Feet supported Sitting balance-Leahy Scale: Good Sitting balance - Comments: maintains stability to sit on SOB   Standing balance support: Bilateral upper extremity supported, During functional activity Standing balance-Leahy Scale: Poor Standing balance comment: requires stable walker support                            Cognition Arousal/Alertness: Awake/alert Behavior During Therapy: WFL for tasks assessed/performed Overall Cognitive Status: Within Functional Limits for tasks assessed                                 General Comments: still doing self transfers but CNA stopped in to remind pt  that she needs to call for help        Exercises      General Comments General comments (skin integrity, edema, etc.): no drainage and bandage clean on RLE      Pertinent Vitals/Pain Pain Assessment Pain Assessment: Faces Faces Pain Scale: Hurts little more Pain Location: right foot Pain Descriptors / Indicators: Guarding, Grimacing, Aching Pain Intervention(s):  Limited activity within patient's tolerance, Monitored during session, Premedicated before session, Repositioned    Home Living                          Prior Function            PT Goals (current goals can now be found in the care plan section) Acute Rehab PT Goals Patient Stated Goal: go home Progress towards PT goals: Progressing toward goals    Frequency    Min 3X/week      PT Plan Current plan remains appropriate    Co-evaluation              AM-PAC PT "6 Clicks" Mobility   Outcome Measure  Help needed turning from your back to your side while in a flat bed without using bedrails?: None Help needed moving from lying on your back to sitting on the side of a flat bed without using bedrails?: None Help needed moving to and from a bed to a chair (including a wheelchair)?: A Little Help needed standing up from a chair using your arms (e.g., wheelchair or bedside chair)?: A Little Help needed to walk in hospital room?: A Lot Help needed climbing 3-5 steps with a railing? : Total 6 Click Score: 17    End of Session Equipment Utilized During Treatment: Gait belt;Other (comment) (wound vac) Activity Tolerance: Patient limited by fatigue;Patient limited by pain Patient left: in chair;with call bell/phone within reach;with chair alarm set Nurse Communication: Mobility status;Weight bearing status;Precautions PT Visit Diagnosis: Unsteadiness on feet (R26.81);Muscle weakness (generalized) (M62.81);Pain Pain - Right/Left: Right Pain - part of body: Ankle and joints of foot     Time: 1856-3149 PT Time Calculation (min) (ACUTE ONLY): 38 min  Charges:  $Gait Training: 8-22 mins $Therapeutic Activity: 23-37 mins     Ramond Dial 04/15/2022, 5:40 PM  Mee Hives, PT PhD Acute Rehab Dept. Number: [place] and [place]

## 2022-04-15 NOTE — TOC Progression Note (Signed)
Transition of Care [facility]) - Progression Note    Patient Details  Name: Katherine Herrera MRN: [id number] Date of Birth: October 05, 1966  Transition of Care [facility]) CM/SW [place], [place] Phone Number: 04/15/2022, 1:44 PM  Clinical Narrative:    CSW provided bed offers to pt and pt and family have chosen [place]. Authorization pending at this time. Facility noted they cannot accept admissions over the weekend. TOC will continue to follow for DC needs.   Expected Discharge Plan: Skilled Nursing Facility Barriers to Discharge: Ship broker, Continued Medical Work up, SNF Pending bed offer  Expected Discharge Plan and Services Expected Discharge Plan: [place] Choice: [place] arrangements for the past 2 months: Single Family Home                                       Social Determinants of Health (SDOH) Interventions    Readmission Risk Interventions    01/15/2020   10:17 AM  Readmission Risk Prevention Plan  Transportation Screening Complete  PCP or Specialist Appt within 5-7 Days Complete  Home Care Screening Complete  Medication Review (RN CM) Complete

## 2022-04-16 DIAGNOSIS — M86671 Other chronic osteomyelitis, right ankle and foot: Secondary | ICD-10-CM | POA: Diagnosis not present

## 2022-04-16 LAB — GLUCOSE, CAPILLARY
Glucose-Capillary: 157 mg/dL — ABNORMAL HIGH (ref 70–99)
Glucose-Capillary: 366 mg/dL — ABNORMAL HIGH (ref 70–99)
Glucose-Capillary: 375 mg/dL — ABNORMAL HIGH (ref 70–99)
Glucose-Capillary: 415 mg/dL — ABNORMAL HIGH (ref 70–99)

## 2022-04-16 LAB — GLUCOSE, RANDOM: Glucose, Bld: 410 mg/dL — ABNORMAL HIGH (ref 70–99)

## 2022-04-16 MED ORDER — INSULIN ASPART PROT & ASPART (70-30 MIX) 100 UNIT/ML ~~LOC~~ SUSP
32.0000 [IU] | Freq: Two times a day (BID) | SUBCUTANEOUS | Status: DC
  Administered 2022-04-16 – 2022-04-17 (×2): 32 [IU] via SUBCUTANEOUS
  Filled 2022-04-16: qty 10

## 2022-04-16 MED ORDER — INSULIN ASPART 100 UNIT/ML IJ SOLN
0.0000 [IU] | Freq: Every day | INTRAMUSCULAR | Status: DC
  Administered 2022-04-16 – 2022-04-17 (×2): 5 [IU] via SUBCUTANEOUS

## 2022-04-16 MED ORDER — INSULIN ASPART 100 UNIT/ML IJ SOLN
0.0000 [IU] | Freq: Three times a day (TID) | INTRAMUSCULAR | Status: DC
  Administered 2022-04-16: 15 [IU] via SUBCUTANEOUS
  Administered 2022-04-17: 5 [IU] via SUBCUTANEOUS
  Administered 2022-04-17: 11 [IU] via SUBCUTANEOUS
  Administered 2022-04-18: 3 [IU] via SUBCUTANEOUS
  Administered 2022-04-18: 5 [IU] via SUBCUTANEOUS
  Administered 2022-04-18: 11 [IU] via SUBCUTANEOUS

## 2022-04-16 MED ORDER — INSULIN ASPART 100 UNIT/ML IJ SOLN
5.0000 [IU] | Freq: Once | INTRAMUSCULAR | Status: AC
  Administered 2022-04-16: 5 [IU] via SUBCUTANEOUS

## 2022-04-16 NOTE — Progress Notes (Signed)
  Subjective:  Patient ID: Katherine Herrera, female    DOB: 07-Dec-1965,  MRN: [id number]  A 56 y.o. female presents with right foot osteomyelitis status post application of Integra and negative pressure wound therapy on 04/13/2022.  She is having some pain from the [facility].  Pain is controlled.  VAC is functioning intact no nausea fever chills vomiting. Objective:   Vitals:   04/16/22 0544 04/16/22 0815  BP: 137/79 140/79  Pulse: 83 88  Resp: 17 16  Temp: 98.1 F (36.7 C) 98.3 F (36.8 C)  SpO2: 97% 98%   General AA&O x3. Normal mood and affect.  Vascular Dorsalis pedis and posterior tibial pulses 2/4 bilat. Brisk capillary refill to all digits. Pedal hair present.  Neurologic Epicritic sensation grossly intact.  Dermatologic Dressing clean dry and intact.  VAC is functioning well.  No signs of redness or cellulitis noted.  No calf pain.  Orthopedic: MMT 5/5 in dorsiflexion, plantarflexion, inversion, and eversion. Normal joint ROM without pain or crepitus.    Assessment & Plan:  Patient was evaluated and treated and all questions answered.  Right foot osteomyelitis status post excisional debridement of the wound with application of Integra and wound VAC postop day 3 -All questions and concerns were discussed with the patient in extensive detail -Wound VAC change planned today.  Then will go on Monday Wednesday Friday schedule. -Awaiting SNF placement. -I will see her in clinic 1 week from discharge.  She is okay to be discharged from my standpoint. -Antibiotics per infectious disease -Nonweightbearing to the right lower extremity  Felipa Furnace, DPM  Accessible via secure chat for questions or concerns.

## 2022-04-16 NOTE — Progress Notes (Signed)
PROGRESS NOTE  MIKIYAH GLASNER  DOB: 1966/04/30  PCP: Cassandria Anger, MD ASN:[id number]  DOA: 04/08/2022  LOS: 8 days  Hospital Day: 9  Brief narrative: Katherine Herrera is a 56 y.o. female with PMH significant for gout, HTN, HLD, history of childhood seizures, chronic pain syndrome, chronic osteomyelitis of the right foot who was sent to the ED from wound care center for IV antibiotics due to worsening right foot wound, concern for worsening osteomyelitis. Patient initially placed on IV antibiotics.   Admitted to [facility].   Podiatry consulted.  See below for details  Subjective: Patient was seen and examined this afternoon.  Not in distress.  No new symptoms. Blood sugar level elevated this morning.   Principal Problem:   Osteomyelitis of foot ([place]) Active Problems:   AKI (acute kidney injury) ([place])   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 ([place])   Essential hypertension   Hypothyroidism   Pure hypercholesterolemia   Anxiety disorder   Chronic pain    Assessment and plan: Right foot osteomyelitis -6/11, patient underwent excisional debridement of the wound  -1/93, underwent application of Integra and wound VAC. -Per podiatry, patient will need wound VAC changes Monday Wednesday Friday. -Wound culture growing Staphylococcus stimulants. -ID following for antibiotics.  Initially improved with IV antibiotics.  Currently on oral Augmentin plan till 05/04/22  Type 2 diabetes mellitus Peripheral neuropathy -A1c 7.5 on 04/10/2022 -Home meds include 70/30 insulin -Currently on 28 units of insulin 70/30 twice a day, sliding scale insulin with Accu-Cheks.  Blood sugar level elevated to over 300 this afternoon.  Increase 70/30 insulin to 32 units twice daily.  Upgrade sliding scale from sensitive to moderately sensitive scale. -Diabetes care coordinator consult appreciated. Recent Labs  Lab 04/15/22 1223 04/15/22 1746 04/15/22 2148 04/16/22 0813 04/16/22 1207  GLUCAP  141* 185* 329* 157* 366*    AKI (acute kidney injury)  -Renal function improving on IV fluid -Continue to hold baclofen and lisinopril.  Recent Labs    03/16/22 1057 04/08/22 1220 04/09/22 0052 04/10/22 0132 04/11/22 0657 04/12/22 0037 04/13/22 0012 04/13/22 2238 04/14/22 0911 04/15/22 0243  BUN '17 18 18 19 16 16 17 '$ 27* 23* 17  CREATININE 1.18* 1.24* 1.08* 1.09* 0.99 1.01* 1.08* 1.56* 1.03* 0.96    Essential hypertension -Blood pressure currently stable.   -Lisinopril on hold secondary to AKI.   -Continue to monitor   Hypothyroidism -TSH wnl in 08/2021 -Continue home synthroid    Pure hypercholesterolemia -Continue zetia. Statin intolerance.  -Recent LDL of 135    Anxiety disorder -Continue prozac and klonopin    Chronic pain -Continue home oxycodone.   COPD (chronic obstructive pulmonary disease) -Respiratory status stable.  Goals of care   Code Status: Full Code    Mobility: Encourage ambulation  Skin assessment:     Nutritional status:  Body mass index is 32.04 kg/m.          Diet:  Diet Order             Diet Carb Modified Fluid consistency: Thin; Room service appropriate? Yes  Diet effective now                   DVT prophylaxis:  enoxaparin (LOVENOX) injection 40 mg Start: 04/14/22 0800   Antimicrobials: Augmentin Fluid: Not on IV fluids currently Consultants: Podiatry Family Communication: None at bedside  Status is: Inpatient  Continue in-hospital care because: Pending placement Level of care: Med-Surg   Dispo: The patient is  from: Home              Anticipated d/c is to: SNF              Patient currently is not medically stable to d/c.   Difficult to place patient No     Infusions:     Scheduled Meds:  amoxicillin-clavulanate  1 tablet Oral Q12H   bisacodyl  10 mg Oral QHS   docusate sodium  100 mg Oral QHS   enoxaparin (LOVENOX) injection  40 mg Subcutaneous Q24H   ezetimibe  10 mg Oral Daily    FLUoxetine  10 mg Oral Daily   gabapentin  800 mg Oral TID   insulin aspart  0-5 Units Subcutaneous QHS   insulin aspart  0-9 Units Subcutaneous TID WC   insulin aspart protamine- aspart  28 Units Subcutaneous BID WC   lamoTRIgine  25 mg Oral BID   levothyroxine  75 mcg Oral QAC breakfast   rOPINIRole  0.5 mg Oral Daily    PRN meds: acetaminophen **OR** acetaminophen, clonazePAM, loratadine, morphine injection, oxyCODONE   Antimicrobials: Anti-infectives (From admission, onward)    Start     Dose/Rate Route Frequency Ordered Stop   04/13/22 1045  amoxicillin-clavulanate (AUGMENTIN) 875-125 MG per tablet 1 tablet        1 tablet Oral Every 12 hours 04/13/22 0948 05/04/22 0959   04/11/22 1400  vancomycin (VANCOREADY) IVPB 1250 mg/250 mL  Status:  Discontinued        1,250 mg 166.7 mL/hr over 90 Minutes Intravenous Every 24 hours 04/10/22 1328 04/12/22 1107   04/11/22 1400  cefTRIAXone (ROCEPHIN) 2 g in sodium chloride 0.9 % 100 mL IVPB  Status:  Discontinued        2 g 200 mL/hr over 30 Minutes Intravenous Every 24 hours 04/11/22 1235 04/13/22 0948   04/10/22 1415  vancomycin (VANCOREADY) IVPB 2000 mg/400 mL        2,000 mg 200 mL/hr over 120 Minutes Intravenous  Once 04/10/22 1328 04/10/22 1745   04/10/22 1400  ceFEPIme (MAXIPIME) 2 g in sodium chloride 0.9 % 100 mL IVPB  Status:  Discontinued        2 g 200 mL/hr over 30 Minutes Intravenous Every 8 hours 04/10/22 1314 04/11/22 1235   04/10/22 1041  vancomycin (VANCOCIN) powder  Status:  Discontinued          As needed 04/10/22 1041 04/10/22 1054   04/09/22 1800  vancomycin (VANCOREADY) IVPB 1250 mg/250 mL  Status:  Discontinued        1,250 mg 166.7 mL/hr over 90 Minutes Intravenous Every 24 hours 04/08/22 1620 04/09/22 1723   04/08/22 1815  ceFEPIme (MAXIPIME) 2 g in sodium chloride 0.9 % 100 mL IVPB  Status:  Discontinued        2 g 200 mL/hr over 30 Minutes Intravenous Every 8 hours 04/08/22 1800 04/09/22 1723   04/08/22  1800  metroNIDAZOLE (FLAGYL) IVPB 500 mg  Status:  Discontinued        500 mg 100 mL/hr over 60 Minutes Intravenous Every 12 hours 04/08/22 1746 04/13/22 0948   04/08/22 1645  cefTRIAXone (ROCEPHIN) 2 g in sodium chloride 0.9 % 100 mL IVPB        2 g 200 mL/hr over 30 Minutes Intravenous  Once 04/08/22 1630 04/08/22 1735   04/08/22 1630  vancomycin (VANCOREADY) IVPB 1500 mg/300 mL        1,500 mg 150 mL/hr over 120 Minutes Intravenous  Once 04/08/22 1619 04/08/22 2051       Objective: Vitals:   04/16/22 0544 04/16/22 0815  BP: 137/79 140/79  Pulse: 83 88  Resp: 17 16  Temp: 98.1 F (36.7 C) 98.3 F (36.8 C)  SpO2: 97% 98%    Intake/Output Summary (Last 24 hours) at 04/16/2022 1344 Last data filed at 04/16/2022 0515 Gross per 24 hour  Intake 1966.69 ml  Output 1900 ml  Net 66.69 ml    Filed Weights   04/08/22 1127 04/13/22 1058  Weight: 98.4 kg 98.4 kg   Weight change:  Body mass index is 32.04 kg/m.   Physical Exam: General exam: Pleasant, middle-aged Caucasian female. Skin: No rashes, lesions or ulcers. HEENT: Atraumatic, normocephalic, no obvious bleeding Lungs: Clear to auscultation bilaterally CVS: Regular rate and rhythm, normal GI/Abd soft, nontender, nondistended, bowel sound present CNS: Alert, awake, oriented x3 Psychiatry: Mood appropriate Extremities: No pedal edema, no calf tenderness  Data Review: I have personally reviewed the laboratory data and studies available.  F/u labs ordered Unresulted Labs (From admission, onward)     Start     Ordered   04/11/22 0932  Acid Fast Smear (AFB)  (AFB smear + Culture w reflexed sensitivities panel)  Add-on,   AD       See Hyperspace for full Linked Orders Report.   04/11/22 0931   04/11/22 0932  Acid Fast Culture with reflexed sensitivities  (AFB smear + Culture w reflexed sensitivities panel)  Add-on,   AD       See Hyperspace for full Linked Orders Report.   04/11/22 0931             Signed, Terrilee Croak, MD [facility] 04/16/2022

## 2022-04-16 NOTE — Progress Notes (Signed)
Occupational Therapy Treatment Patient Details Name: Katherine Herrera MRN: [id number] DOB: 1966-01-14 Today's Date: 04/16/2022   History of present illness Patient 56 year old female admitted 6/9 due to worsening right foot wound, concern for worsening osteomyelitis. Pt underwent I&D and wound VAC placement right foot on 6/11.  PMH: type 2 diabetes, history of childhood seizures, chronic pain syndrome, hyperlipidemia, hypothyroidism   OT comments  Pt. Seen for skilled OT treatment session.  Education and Programmer, applications for bsc transfer.  Pt. Able to return demo while maintaining nwb status.  Remains highly motivated and states she will cont. To transfer without notifying staff even with our safety concerns noted.  Eager for continued therapies and expresses interest in shower vs. Sponge bathe if able to manage her wound vac.  Current d/c recommendations remain appropriate.     Recommendations for follow up therapy are one component of a multi-disciplinary discharge planning process, led by the attending physician.  Recommendations may be updated based on patient status, additional functional criteria and insurance authorization.    Follow Up Recommendations  Acute inpatient rehab (3hours/day)    Assistance Recommended at Discharge    Patient can return home with the following  A little help with walking and/or transfers;A little help with bathing/dressing/bathroom   Equipment Recommendations  Other (comment);Tub/shower bench;BSC/3in1    Recommendations for Other Services Rehab consult    Precautions / Restrictions Precautions Precautions: Fall Precaution Comments: Wound VAC right foot Restrictions RLE Weight Bearing: Non weight bearing       Mobility Bed Mobility Overal bed mobility: Modified Independent                  Transfers                         Balance                                           ADL either performed or assessed  with clinical judgement   ADL Overall ADL's : Needs assistance/impaired                         Toilet Transfer: Min Patent examiner Details (indicate cue type and reason): aware of pt. transfering without calling for assistance. had her demo so i could see how she is doing it and level of safety. pt. approached 3n1 with bues on each arm rest then did a full turn while actually maintaining nwb. heads back to bed also forward on bues bent over bed then crawls into it. after observing this and her stating she is going to no matter what i asked if she would let me teach her a safer way.  pt. able to return demo of squat pivot to right and back to bed vs. going forward. reviewed how this increases fall risk. she agreed and actually said she liked my method better and agreed it was easier.  showed her to angle the bsc to bed vs. in front of it to promote a more direct pivot and shorter distance Toileting- Water quality scientist and Hygiene: Min guard;Sit to/from stand;Bed level Toileting - Clothing Manipulation Details (indicate cue type and reason): initial demo included pt. bent over bed on one foot reaching behing to wipe then crawling into bed and swinging herself around.  reivewed leaning or staying sitting to  wipe she laughed and agreed her way was very unsafe       General ADL Comments: pt. receptive to safer pivot to/from bsc vs her original method.  cont. to verbalize she will transfer without assistance even with our safety concerns noted    Extremity/Trunk Assessment              Vision       Perception     Praxis      Cognition Arousal/Alertness: Awake/alert Behavior During Therapy: WFL for tasks assessed/performed Overall Cognitive Status: Within Functional Limits for tasks assessed                                 General Comments: still doing self transfers but CNA states she is going to cont. to go to/from bsc even wtih  our telling her to call for help        Exercises      Shoulder Instructions       General Comments      Pertinent Vitals/ Pain       Pain Assessment Pain Assessment: No/denies pain  Home Living                                          Prior Functioning/Environment              Frequency  Min 3X/week        Progress Toward Goals  OT Goals(current goals can now be found in the care plan section)  Progress towards OT goals: Progressing toward goals     Plan      Co-evaluation                 AM-PAC OT "6 Clicks" Daily Activity     Outcome Measure   Help from another person eating meals?: None Help from another person taking care of personal grooming?: A Little Help from another person toileting, which includes using toliet, bedpan, or urinal?: A Lot Help from another person bathing (including washing, rinsing, drying)?: A Little Help from another person to put on and taking off regular upper body clothing?: A Little Help from another person to put on and taking off regular lower body clothing?: A Lot 6 Click Score: 17    End of Session Equipment Utilized During Treatment: Rolling walker (2 wheels)  OT Visit Diagnosis: Unsteadiness on feet (R26.81);Pain Pain - Right/Left: Right Pain - part of body: Ankle and joints of foot   Activity Tolerance Patient tolerated treatment well   Patient Left in bed;with call bell/phone within reach   Nurse Communication          Time: 1884-1660 OT Time Calculation (min): 10 min  Charges: OT General Charges $OT Visit: 1 Visit OT Treatments $Self Care/Home Management : 8-22 mins  Sonia Baller, COTA/L Acute Rehabilitation [phone]   Tanya Nones 04/16/2022, 11:49 AM

## 2022-04-17 DIAGNOSIS — M86671 Other chronic osteomyelitis, right ankle and foot: Secondary | ICD-10-CM | POA: Diagnosis not present

## 2022-04-17 LAB — GLUCOSE, CAPILLARY
Glucose-Capillary: 223 mg/dL — ABNORMAL HIGH (ref 70–99)
Glucose-Capillary: 246 mg/dL — ABNORMAL HIGH (ref 70–99)
Glucose-Capillary: 322 mg/dL — ABNORMAL HIGH (ref 70–99)
Glucose-Capillary: 376 mg/dL — ABNORMAL HIGH (ref 70–99)

## 2022-04-17 MED ORDER — INSULIN ASPART PROT & ASPART (70-30 MIX) 100 UNIT/ML ~~LOC~~ SUSP
36.0000 [IU] | Freq: Two times a day (BID) | SUBCUTANEOUS | Status: DC
  Administered 2022-04-17 – 2022-04-18 (×2): 36 [IU] via SUBCUTANEOUS
  Filled 2022-04-17: qty 10

## 2022-04-17 NOTE — Plan of Care (Signed)
  Problem: Coping: Goal: Ability to adjust to condition or change in health will improve Outcome: Progressing   Problem: Metabolic: Goal: Ability to maintain appropriate glucose levels will improve Outcome: Progressing   Problem: Nutritional: Goal: Maintenance of adequate nutrition will improve Outcome: Progressing   Problem: Skin Integrity: Goal: Risk for impaired skin integrity will decrease Outcome: Progressing   Problem: Tissue Perfusion: Goal: Adequacy of tissue perfusion will improve Outcome: Progressing   Problem: Education: Goal: Knowledge of General Education information will improve Description: Including pain rating scale, medication(s)/side effects and non-pharmacologic comfort measures Outcome: Progressing   Problem: Activity: Goal: Risk for activity intolerance will decrease Outcome: Progressing   Problem: Pain Managment: Goal: General experience of comfort will improve Outcome: Progressing   Problem: Skin Integrity: Goal: Risk for impaired skin integrity will decrease Outcome: Progressing

## 2022-04-17 NOTE — Plan of Care (Signed)
  Problem: Education: Goal: Knowledge of General Education information will improve Description Including pain rating scale, medication(s)/side effects and non-pharmacologic comfort measures Outcome: Progressing   

## 2022-04-17 NOTE — Progress Notes (Signed)
PROGRESS NOTE  Katherine Herrera  DOB: [birth year]  PCP: Cassandria Anger, MD RKY:[id number]  DOA: 04/08/2022  LOS: 9 days  Hospital Day: 10  Brief narrative: Katherine Herrera is a 56 y.o. female with PMH significant for gout, HTN, HLD, history of childhood seizures, chronic pain syndrome, chronic osteomyelitis of the right foot who was sent to the ED from wound care center for IV antibiotics due to worsening right foot wound, concern for worsening osteomyelitis. Patient initially placed on IV antibiotics.   Admitted to [facility].   Podiatry consulted.  See below for details  Subjective: Patient was seen and examined this afternoon.  Not in distress.  No new symptoms. Blood sugar level difficult to control.   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 -Per podiatry, patient will need wound VAC changes Monday Wednesday Friday. -Wound culture growing Staphylococcus stimulants. -ID following for antibiotics.  Initially improved with IV antibiotics.  Currently on oral Augmentin plan till 05/04/22  Type 2 diabetes mellitus Peripheral neuropathy -A1c 7.5 on 04/10/2022 -Home meds include 70/30 insulin -Blood sugar level difficult control.  Patient is getting 70/30 insulin at escalating dose to control blood sugar level.   -Currently on 32 units of insulin 70/30 twice a day, sliding scale insulin with Accu-Cheks.  Blood sugar level still remains elevated.  I increased the insulin to 36 units twice daily.  Also continue moderately sensitive sliding scale. -Diabetes care coordinator consult appreciated. Recent  Labs  Lab 04/16/22 1207 04/16/22 1648 04/16/22 2120 04/17/22 0818 04/17/22 1130  GLUCAP 366* 375* 415* 223* 246*    AKI (acute kidney injury)  -Renal function improving on IV fluid -Continue to hold baclofen and lisinopril.  Recent Labs    03/16/22 1057 04/08/22 1220 04/09/22 0052 04/10/22 0132 04/11/22 0657 04/12/22 0037 04/13/22 0012 04/13/22 2238 04/14/22 0911 04/15/22 0243  BUN '17 18 18 19 16 16 17 '$ 27* 23* 17  CREATININE 1.18* 1.24* 1.08* 1.09* 0.99 1.01* 1.08* 1.56* 1.03* 0.96    Essential hypertension -Blood pressure currently stable.   -Lisinopril on hold secondary to AKI.   -Continue to monitor   Hypothyroidism -TSH wnl in 08/2021 -Continue home synthroid    Pure hypercholesterolemia -Continue zetia. Statin intolerance.  -Recent LDL of 135    Anxiety disorder -Continue prozac and klonopin    Chronic pain -Continue home oxycodone.   COPD (chronic obstructive pulmonary disease) -Respiratory status stable.  Goals of care   Code Status: Full Code    Mobility: Encourage ambulation  Skin assessment:     Nutritional status:  Body mass index is 32.04 kg/m.          Diet:  Diet Order             Diet Carb Modified Fluid consistency: Thin; Room service appropriate? Yes  Diet effective now                   DVT prophylaxis:  enoxaparin (LOVENOX) injection 40 mg Start: 04/14/22 0800   Antimicrobials: Augmentin Fluid: Not on IV fluids currently Consultants: Podiatry Family Communication: None at bedside  Status is: Inpatient  Continue in-hospital care because: Pending placement Level of care: Med-Surg   Dispo: The patient is from: Home              Anticipated d/c is to: SNF              Patient currently is not medically stable to d/c.   Difficult to place patient No     Infusions:     Scheduled Meds:  amoxicillin-clavulanate  1 tablet Oral Q12H   bisacodyl  10 mg Oral QHS   docusate sodium  100 mg Oral QHS    enoxaparin (LOVENOX) injection  40 mg Subcutaneous Q24H   ezetimibe  10 mg Oral Daily   FLUoxetine  10 mg Oral Daily   gabapentin  800 mg Oral TID   insulin aspart  0-15 Units Subcutaneous TID WC   insulin aspart  0-5 Units Subcutaneous QHS   insulin aspart  0-5 Units Subcutaneous QHS   insulin aspart protamine- aspart  36 Units Subcutaneous BID WC   lamoTRIgine  25 mg Oral BID   levothyroxine  75 mcg Oral QAC breakfast   rOPINIRole  0.5 mg Oral Daily    PRN meds: acetaminophen **OR** acetaminophen, clonazePAM, loratadine, morphine injection, oxyCODONE   Antimicrobials: Anti-infectives (From admission, onward)    Start     Dose/Rate Route Frequency Ordered Stop   04/13/22 1045  amoxicillin-clavulanate (AUGMENTIN) 875-125 MG per tablet 1 tablet        1 tablet Oral Every 12 hours 04/13/22 0948 05/04/22 0959   04/11/22 1400  vancomycin (VANCOREADY) IVPB 1250 mg/250 mL  Status:  Discontinued        1,250 mg 166.7 mL/hr over 90 Minutes Intravenous Every 24 hours 04/10/22 1328 04/12/22 1107   04/11/22 1400  cefTRIAXone (ROCEPHIN) 2 g in sodium chloride 0.9 % 100 mL IVPB  Status:  Discontinued        2 g 200 mL/hr over 30 Minutes Intravenous Every 24 hours 04/11/22 1235 04/13/22 0948   04/10/22 1415  vancomycin (VANCOREADY) IVPB 2000 mg/400 mL        2,000 mg 200 mL/hr over 120 Minutes Intravenous  Once 04/10/22 1328 04/10/22 1745   04/10/22 1400  ceFEPIme (MAXIPIME) 2 g in sodium chloride 0.9 % 100 mL IVPB  Status:  Discontinued        2 g 200 mL/hr over 30 Minutes Intravenous Every 8 hours 04/10/22 1314 04/11/22 1235   04/10/22 1041  vancomycin (VANCOCIN) powder  Status:  Discontinued          As needed 04/10/22 1041 04/10/22 1054   04/09/22 1800  vancomycin (VANCOREADY) IVPB 1250 mg/250 mL  Status:  Discontinued        1,250 mg 166.7 mL/hr over 90 Minutes Intravenous Every 24 hours 04/08/22 1620 04/09/22 1723   04/08/22 1815  ceFEPIme (MAXIPIME) 2 g in sodium chloride 0.9 % 100 mL  IVPB  Status:  Discontinued        2 g 200 mL/hr over 30 Minutes Intravenous Every 8 hours 04/08/22 1800 04/09/22 1723   04/08/22 1800  metroNIDAZOLE (FLAGYL) IVPB 500 mg  Status:  Discontinued        500 mg 100 mL/hr over 60 Minutes Intravenous Every 12 hours 04/08/22 1746 04/13/22 0948   04/08/22 1645  cefTRIAXone (ROCEPHIN) 2 g in sodium chloride 0.9 % 100 mL IVPB        2 g 200 mL/hr over 30 Minutes Intravenous  Once 04/08/22 1630 04/08/22 1735  04/08/22 1630  vancomycin (VANCOREADY) IVPB 1500 mg/300 mL        1,500 mg 150 mL/hr over 120 Minutes Intravenous  Once 04/08/22 1619 04/08/22 2051       Objective: Vitals:   04/17/22 0526 04/17/22 0715  BP: 103/71 109/65  Pulse: 85 89  Resp: 17 20  Temp: 98.4 F (36.9 C) 98.7 F (37.1 C)  SpO2: 95% 91%    Intake/Output Summary (Last 24 hours) at 04/17/2022 1435 Last data filed at 04/17/2022 0813 Gross per 24 hour  Intake 480 ml  Output 1600 ml  Net -1120 ml    Filed Weights   04/08/22 1127 04/13/22 1058  Weight: 98.4 kg 98.4 kg   Weight change:  Body mass index is 32.04 kg/m.   Physical Exam: General exam: Pleasant, middle-aged Caucasian female. Skin: No rashes, lesions or ulcers. HEENT: Atraumatic, normocephalic, no obvious bleeding Lungs: Clear to auscultation bilaterally CVS: Regular rate and rhythm, normal GI/Abd soft, nontender, nondistended, bowel sound present CNS: Alert, awake, oriented x3 Psychiatry: Mood appropriate Extremities: No pedal edema, no calf tenderness.  Right foot with bandage and wound VAC.  Data Review: I have personally reviewed the laboratory data and studies available.  F/u labs ordered Unresulted Labs (From admission, onward)     Start     Ordered   04/11/22 0932  Acid Fast Smear (AFB)  (AFB smear + Culture w reflexed sensitivities panel)  Add-on,   AD       See Hyperspace for full Linked Orders Report.   04/11/22 0931   04/11/22 0932  Acid Fast Culture with reflexed sensitivities   (AFB smear + Culture w reflexed sensitivities panel)  Add-on,   AD       See Hyperspace for full Linked Orders Report.   04/11/22 0931            Signed, Terrilee Croak, MD [facility] 04/17/2022

## 2022-04-18 DIAGNOSIS — M86671 Other chronic osteomyelitis, right ankle and foot: Secondary | ICD-10-CM | POA: Diagnosis not present

## 2022-04-18 LAB — GLUCOSE, CAPILLARY
Glucose-Capillary: 166 mg/dL — ABNORMAL HIGH (ref 70–99)
Glucose-Capillary: 231 mg/dL — ABNORMAL HIGH (ref 70–99)
Glucose-Capillary: 316 mg/dL — ABNORMAL HIGH (ref 70–99)
Glucose-Capillary: 438 mg/dL — ABNORMAL HIGH (ref 70–99)

## 2022-04-18 MED ORDER — CLONAZEPAM 0.5 MG PO TABS: 0.5000 mg | ORAL_TABLET | Freq: Two times a day (BID) | ORAL | 0 refills | Status: DC | PRN

## 2022-04-18 MED ORDER — INSULIN ASPART 100 UNIT/ML IJ SOLN: 0.0000 [IU] | Freq: Every day | INTRAMUSCULAR | 11 refills | Status: DC

## 2022-04-18 MED ORDER — INSULIN ASPART PROT & ASPART (70-30 MIX) 100 UNIT/ML ~~LOC~~ SUSP: 40.0000 [IU] | Freq: Two times a day (BID) | SUBCUTANEOUS | 11 refills | Status: DC

## 2022-04-18 MED ORDER — INSULIN ASPART 100 UNIT/ML IJ SOLN: 0.0000 [IU] | Freq: Three times a day (TID) | INTRAMUSCULAR | 11 refills | Status: DC

## 2022-04-18 MED ORDER — INSULIN ASPART PROT & ASPART (70-30 MIX) 100 UNIT/ML ~~LOC~~ SUSP
40.0000 [IU] | Freq: Two times a day (BID) | SUBCUTANEOUS | Status: DC
  Administered 2022-04-18: 40 [IU] via SUBCUTANEOUS
  Filled 2022-04-18: qty 10

## 2022-04-18 MED ORDER — INSULIN ASPART 100 UNIT/ML IJ SOLN
10.0000 [IU] | Freq: Once | INTRAMUSCULAR | Status: AC
  Administered 2022-04-18: 10 [IU] via SUBCUTANEOUS

## 2022-04-18 MED ORDER — AMOXICILLIN-POT CLAVULANATE 875-125 MG PO TABS: 1.0000 | ORAL_TABLET | Freq: Two times a day (BID) | ORAL | Status: AC

## 2022-04-18 MED ORDER — BACLOFEN 10 MG PO TABS
20.0000 mg | ORAL_TABLET | Freq: Two times a day (BID) | ORAL | 0 refills | Status: AC | PRN
Start: 2022-04-18 — End: 2022-04-23

## 2022-04-18 MED ORDER — OXYCODONE HCL 15 MG PO TABS
15.0000 mg | ORAL_TABLET | Freq: Four times a day (QID) | ORAL | 0 refills | Status: AC | PRN
Start: 2022-04-18 — End: 2022-04-23

## 2022-04-18 NOTE — TOC Progression Note (Addendum)
Transition of Care [facility]) - Progression Note    Patient Details  Name: TAMURA LASKY MRN: [id number] Date of Birth: 1966-10-28  [facility] CM/SW Contact  Reece Agar, [place] Phone Number: 04/18/2022, 1:28 PM  Clinical Narrative:    [facility] is ready to accept pt with understanding that pt will DC with a  wound vac. Pt is not medically stable for DC today. CSW will continue to follow.   Expected Discharge Plan: Skilled Nursing Facility Barriers to Discharge: Ship broker, Continued Medical Work up, SNF Pending bed offer  Expected Discharge Plan and Services Expected Discharge Plan: [place] Choice: [place] arrangements for the past 2 months: Single Family Home                                       Social Determinants of Health (SDOH) Interventions    Readmission Risk Interventions    01/15/2020   10:17 AM  Readmission Risk Prevention Plan  Transportation Screening Complete  PCP or Specialist Appt within 5-7 Days Complete  Home Care Screening Complete  Medication Review (RN CM) Complete

## 2022-04-18 NOTE — Progress Notes (Addendum)
PROGRESS NOTE  Katherine Herrera  DOB: 06-05-1966  PCP: Cassandria Anger, MD NID:[id number]  DOA: 04/08/2022  LOS: 10 days  Hospital Day: 11  Brief narrative: Katherine Herrera is a 56 y.o. female with PMH significant for gout, HTN, HLD, history of childhood seizures, chronic pain syndrome, chronic osteomyelitis of the right foot who was sent to the ED from wound care center for IV antibiotics due to worsening right foot wound, concern for worsening osteomyelitis. Patient initially placed on IV antibiotics.   Admitted to [facility].   Podiatry consulted.  See below for details  Subjective: Patient was seen and examined this morning.  Not in distress.  No new symptoms.   Blood sugar level remains elevated.   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 -Per podiatry, patient will need wound VAC changes Monday Wednesday Friday. -Wound culture growing Staphylococcus simulans. -ID following for antibiotics.  Initially improved with IV antibiotics.  Currently on oral Augmentin plan till 05/04/22  Type 2 diabetes mellitus Peripheral neuropathy -A1c 7.5 on 04/10/2022 -Home meds include 70/30 insulin -Blood sugar level difficult control.  Patient is getting 70/30 insulin at escalating dose to control blood sugar level.   -Currently on 36 units of insulin 70/30 twice a day, sliding scale insulin with Accu-Cheks.  Blood sugar level still remains elevated.  I increased the insulin to 40 units twice daily starting tonight.  Also continue moderately sensitive sliding scale. -Diabetes care coordinator consult  appreciated. Recent Labs  Lab 04/17/22 1130 04/17/22 1646 04/17/22 2122 04/18/22 0807 04/18/22 1128  GLUCAP 246* 322* 376* 166* 231*   AKI (acute kidney injury)  -Renal function improving on IV fluid -Continue to hold baclofen and lisinopril.  Recent Labs    03/16/22 1057 04/08/22 1220 04/09/22 0052 04/10/22 0132 04/11/22 0657 04/12/22 0037 04/13/22 0012 04/13/22 2238 04/14/22 0911 04/15/22 0243  BUN '17 18 18 19 16 16 17 '$ 27* 23* 17  CREATININE 1.18* 1.24* 1.08* 1.09* 0.99 1.01* 1.08* 1.56* 1.03* 0.96   Essential hypertension -Blood pressure currently stable.   -Lisinopril on hold secondary to AKI.   -Continue to monitor   Hypothyroidism -TSH wnl in 08/2021 -Continue home synthroid    Pure hypercholesterolemia -Continue zetia. Statin intolerance.  -Recent LDL of 135    Anxiety disorder -Continue prozac and klonopin    Chronic pain -Continue home oxycodone.   COPD (chronic obstructive pulmonary disease) -Respiratory status stable.  Goals of care   Code Status: Full Code    Mobility: Encourage ambulation  Skin assessment:     Nutritional status:  Body mass index is 32.04 kg/m.          Diet:  Diet Order             Diet Carb Modified Fluid consistency: Thin; Room service appropriate? Yes  Diet effective now                   DVT prophylaxis:  enoxaparin (LOVENOX) injection 40 mg Start: 04/14/22 0800   Antimicrobials: Augmentin Fluid: Not on IV fluids currently Consultants: Podiatry Family Communication: None at bedside  Status is: Inpatient  Continue in-hospital care because: Blood sugar control is off Level of care: Med-Surg   Dispo: The patient is from: Home              Anticipated d/c is to: SNF likely tomorrow              Patient currently is not medically stable to d/c.   Difficult to place patient No     Infusions:     Scheduled Meds:  amoxicillin-clavulanate  1 tablet Oral Q12H   bisacodyl  10 mg Oral  QHS   docusate sodium  100 mg Oral QHS   enoxaparin (LOVENOX) injection  40 mg Subcutaneous Q24H   ezetimibe  10 mg Oral Daily   FLUoxetine  10 mg Oral Daily   gabapentin  800 mg Oral TID   insulin aspart  0-15 Units Subcutaneous TID WC   insulin aspart  0-5 Units Subcutaneous QHS   insulin aspart protamine- aspart  40 Units Subcutaneous BID WC   lamoTRIgine  25 mg Oral BID   levothyroxine  75 mcg Oral QAC breakfast   rOPINIRole  0.5 mg Oral Daily    PRN meds: acetaminophen **OR** acetaminophen, clonazePAM, loratadine, morphine injection, oxyCODONE   Antimicrobials: Anti-infectives (From admission, onward)    Start     Dose/Rate Route Frequency Ordered Stop   04/18/22 0000  amoxicillin-clavulanate (AUGMENTIN) 875-125 MG tablet        1 tablet Oral Every 12 hours 04/18/22 1337 05/04/22 2359   04/13/22 1045  amoxicillin-clavulanate (AUGMENTIN) 875-125 MG per tablet 1 tablet        1 tablet Oral Every 12 hours 04/13/22 0948 05/04/22 0959   04/11/22 1400  vancomycin (VANCOREADY) IVPB 1250 mg/250 mL  Status:  Discontinued        1,250 mg 166.7 mL/hr over 90 Minutes Intravenous Every 24 hours 04/10/22 1328 04/12/22 1107   04/11/22 1400  cefTRIAXone (ROCEPHIN) 2 g in sodium chloride 0.9 % 100 mL IVPB  Status:  Discontinued        2 g 200 mL/hr over 30 Minutes Intravenous Every 24 hours 04/11/22 1235 04/13/22 0948   04/10/22 1415  vancomycin (VANCOREADY) IVPB 2000 mg/400 mL        2,000 mg 200 mL/hr over 120 Minutes Intravenous  Once 04/10/22 1328 04/10/22 1745   04/10/22 1400  ceFEPIme (MAXIPIME) 2 g in sodium chloride 0.9 % 100 mL IVPB  Status:  Discontinued        2 g 200 mL/hr over 30 Minutes Intravenous Every 8 hours 04/10/22 1314 04/11/22 1235   04/10/22 1041  vancomycin (VANCOCIN) powder  Status:  Discontinued          As needed 04/10/22 1041 04/10/22 1054   04/09/22 1800  vancomycin (VANCOREADY) IVPB 1250 mg/250 mL  Status:  Discontinued        1,250 mg 166.7 mL/hr over 90  Minutes Intravenous Every 24 hours 04/08/22 1620 04/09/22 1723   04/08/22 1815  ceFEPIme (MAXIPIME) 2 g in sodium chloride 0.9 % 100 mL IVPB  Status:  Discontinued        2 g 200 mL/hr over 30 Minutes Intravenous Every 8 hours 04/08/22 1800 04/09/22 1723   04/08/22 1800  metroNIDAZOLE (FLAGYL) IVPB 500 mg  Status:  Discontinued        500 mg 100 mL/hr over 60 Minutes Intravenous Every 12 hours 04/08/22 1746 04/13/22 0948   04/08/22 1645  cefTRIAXone (ROCEPHIN) 2 g in sodium chloride 0.9 % 100 mL  IVPB        2 g 200 mL/hr over 30 Minutes Intravenous  Once 04/08/22 1630 04/08/22 1735   04/08/22 1630  vancomycin (VANCOREADY) IVPB 1500 mg/300 mL        1,500 mg 150 mL/hr over 120 Minutes Intravenous  Once 04/08/22 1619 04/08/22 2051       Objective: Vitals:   04/18/22 0609 04/18/22 0724  BP: (!) 109/59 117/69  Pulse: 92 80  Resp: 17 16  Temp: 98.2 F (36.8 C) (!) 97.3 F (36.3 C)  SpO2: 93% 94%    Intake/Output Summary (Last 24 hours) at 04/18/2022 1338 Last data filed at 04/17/2022 2131 Gross per 24 hour  Intake 480 ml  Output 2000 ml  Net -1520 ml   Filed Weights   04/08/22 1127 04/13/22 1058  Weight: 98.4 kg 98.4 kg   Weight change:  Body mass index is 32.04 kg/m.   Physical Exam: General exam: Pleasant, middle-aged Caucasian female. Skin: No rashes, lesions or ulcers. HEENT: Atraumatic, normocephalic, no obvious bleeding Lungs: Clear to auscultation bilaterally CVS: Regular rate and rhythm, normal GI/Abd soft, nontender, nondistended, bowel sound present CNS: Alert, awake, oriented x3 Psychiatry: Mood appropriate Extremities: No pedal edema, no calf tenderness.  Right foot with bandage and wound VAC.  Data Review: I have personally reviewed the laboratory data and studies available.  F/u labs ordered Unresulted Labs (From admission, onward)     Start     Ordered   04/11/22 0932  Acid Fast Smear (AFB)  (AFB smear + Culture w reflexed sensitivities panel)   Add-on,   AD       See Hyperspace for full Linked Orders Report.   04/11/22 0931   04/11/22 0932  Acid Fast Culture with reflexed sensitivities  (AFB smear + Culture w reflexed sensitivities panel)  Add-on,   AD       See Hyperspace for full Linked Orders Report.   04/11/22 0931            Signed, Terrilee Croak, MD [facility] 04/18/2022

## 2022-04-18 NOTE — Consult Note (Addendum)
[facility] Nurse Consult Note: Reason for Consult: Consult requested to change Vac dressing.  Bedside nurse changed this weekend. Wound type: Right outer foot with full thickness post-op wound; staples intact to wound edges, red moist wound bed. Pt was medicated for pain prior to the procedure and tolerated with minimal amt discomfort. Small amt pink drainage in the cannister. Full thickness post-op wound 6X6cm, unable to determine depth. Removed black sponge and previous piece of Adaptic. Applied barrier ring around the outer staple line, then one piece Adaptic and one piece black sponge to 138m cont suction. Applied ace wrap. [facility] will plan to change dressing again on Wed if patient is still in the hospital at that time. DJulien GirtMSN, RN, CThayer CChristiana CBeadle

## 2022-04-18 NOTE — TOC Transition Note (Incomplete)
Transition of Care [facility]) - CM/SW Discharge Note   Patient Details  Name: Katherine Herrera MRN: [id number] Date of Birth: 10-29-1966  [facility] CM/SW Contact:  Tresa Endo Phone Number: 04/18/2022, 12:41 PM   Clinical Narrative:    Patient will DC to: [facility] Anticipated DC date: 04/18/2022 Family notified: Pt Daughter Transport by: Corey Harold   Per MD patient ready for DC to [facility]. RN to call report prior to discharge [phone]). RN, patient, patient's family, and facility notified of DC. Discharge Summary and FL2 sent to facility. DC packet on chart. Wheelchair transport requested for patient.   CSW will sign off for now as social work intervention is no longer needed. Please consult [place] again if new needs arise.       Barriers to Discharge: Ship broker, Continued Medical Work up, SNF Pending bed offer   Patient Goals and CMS Choice Patient states their goals for this hospitalization and ongoing recovery are:: Pt would like to be near her family. CMS Medicare.gov Compare Post Acute Care list provided to:: Patient Choice offered to / list presented to : Patient  Discharge Placement                       Discharge Plan and Services     Post Acute Care Choice: [facility]                               Social Determinants of Health (SDOH) Interventions     Readmission Risk Interventions    01/15/2020   10:17 AM  Readmission Risk Prevention Plan  Transportation Screening Complete  PCP or Specialist Appt within 5-7 Days Complete  Home Care Screening Complete  Medication Review (RN CM) Complete

## 2022-04-18 NOTE — Plan of Care (Signed)
  Problem: Coping: Goal: Ability to adjust to condition or change in health will improve Outcome: Progressing   Problem: Metabolic: Goal: Ability to maintain appropriate glucose levels will improve Outcome: Progressing   Problem: Nutritional: Goal: Maintenance of adequate nutrition will improve Outcome: Progressing Goal: Progress toward achieving an optimal weight will improve Outcome: Progressing   Problem: Skin Integrity: Goal: Risk for impaired skin integrity will decrease Outcome: Progressing   Problem: Education: Goal: Knowledge of General Education information will improve Description: Including pain rating scale, medication(s)/side effects and non-pharmacologic comfort measures Outcome: Progressing   Problem: Activity: Goal: Risk for activity intolerance will decrease Outcome: Progressing   Problem: Nutrition: Goal: Adequate nutrition will be maintained Outcome: Progressing

## 2022-04-18 NOTE — Progress Notes (Signed)
Physical Therapy Treatment Patient Details Name: Katherine Herrera MRN: [id number] DOB: 11-21-1965 Today's Date: 04/18/2022   History of Present Illness Patient 56 year old female admitted 6/9 due to worsening right foot wound, concern for worsening osteomyelitis. Pt underwent I&D and wound VAC placement right foot on 6/11.  PMH: type 2 diabetes, history of childhood seizures, chronic pain syndrome, hyperlipidemia, hypothyroidism    PT Comments    Continuing work on functional mobility and activity tolerance;  Session focused on gait with RW, working to gently advance LLE with good support of UEs on RW, instead of hopping, which can be jarring and energetically taxing; Overall progressing well; Anticipate continuing good progress at post-acute rehabilitation.    Recommendations for follow up therapy are one component of a multi-disciplinary discharge planning process, led by the attending physician.  Recommendations may be updated based on patient status, additional functional criteria and insurance authorization.  Follow Up Recommendations  Skilled nursing-short term rehab (<3 hours/day)     Assistance Recommended at Discharge Intermittent Supervision/Assistance  Patient can return home with the following A little help with walking and/or transfers;A little help with bathing/dressing/bathroom;Assistance with cooking/housework;Assist for transportation;Help with stairs or ramp for entrance   Equipment Recommendations  BSC/3in1;Wheelchair (measurements PT);Wheelchair cushion (measurements PT)    Recommendations for Other Services       Precautions / Restrictions Precautions Precautions: Fall Precaution Comments: Wound VAC right foot Restrictions RLE Weight Bearing: Non weight bearing     Mobility  Bed Mobility Overal bed mobility: Modified Independent                  Transfers Overall transfer level: Needs assistance Equipment used: Rolling walker (2 wheels) Transfers:  Sit to/from Stand, Bed to chair/wheelchair/BSC Sit to Stand: Min guard Stand pivot transfers: Min guard         General transfer comment: Cues for technqiue and safe hand placement    Ambulation/Gait Ambulation/Gait assistance: Min guard, Min assist Gait Distance (Feet): 10 Feet (including turns) Assistive device: Rolling walker (2 wheels), 1 person hand held assist Gait Pattern/deviations:  (3 point gait with RW and LLE) Gait velocity: reduced     General Gait Details: Got up bed to bSC, then stood from [facility] to RW; took steps on LLE around room with a focus on supporting self on RW and gently advancing RLE, instead of hopping, which can be Control and instrumentation engineer [place] (Stroke Patients Only)       Balance     Sitting balance-Leahy Scale: Good       Standing balance-Leahy Scale: Poor Standing balance comment: requires stable walker support                            Cognition Arousal/Alertness: Awake/alert Behavior During Therapy: WFL for tasks assessed/performed Overall Cognitive Status: Within Functional Limits for tasks assessed                                 General Comments: still doing self transfers but CNA states she is going to cont. to go to/from bsc even wtih our telling her to call for help        Exercises      General Comments General comments (skin integrity, edema, etc.): Pt able to clean self after using BSC;  sat to wipe; Pt became tearful re: a conversation she had with a freind earlier; provided support and tissues      Pertinent Vitals/Pain Pain Assessment Pain Assessment: Faces Faces Pain Scale: Hurts a little bit Pain Location: right foot Pain Descriptors / Indicators: Guarding, Grimacing, Aching Pain Intervention(s): Monitored during session    Home Living                          Prior Function            PT Goals (current goals can now be  found in the care plan section) Acute Rehab PT Goals Patient Stated Goal: go home PT Goal Formulation: With patient Time For Goal Achievement: 04/25/22 Potential to Achieve Goals: Good Progress towards PT goals: Progressing toward goals    Frequency    Min 3X/week      PT Plan Current plan remains appropriate    Co-evaluation              AM-PAC PT "6 Clicks" Mobility   Outcome Measure  Help needed turning from your back to your side while in a flat bed without using bedrails?: None Help needed moving from lying on your back to sitting on the side of a flat bed without using bedrails?: None Help needed moving to and from a bed to a chair (including a wheelchair)?: A Little Help needed standing up from a chair using your arms (e.g., wheelchair or bedside chair)?: A Little Help needed to walk in hospital room?: A Lot Help needed climbing 3-5 steps with a railing? : Total 6 Click Score: 17    End of Session Equipment Utilized During Treatment: Gait belt;Other (comment) (wound vac) Activity Tolerance: Patient limited by fatigue;Patient limited by pain Patient left: in chair;with call bell/phone within reach;with chair alarm set Nurse Communication: Mobility status PT Visit Diagnosis: Unsteadiness on feet (R26.81);Muscle weakness (generalized) (M62.81);Pain Pain - Right/Left: Right Pain - part of body: Ankle and joints of foot     Time: 1341-1400 PT Time Calculation (min) (ACUTE ONLY): 19 min  Charges:  $Gait Training: 8-22 mins                     Roney Marion, PT  Acute Rehabilitation Services Office [phone]    [name] 04/18/2022, 5:14 PM

## 2022-04-19 DIAGNOSIS — M86671 Other chronic osteomyelitis, right ankle and foot: Secondary | ICD-10-CM | POA: Diagnosis not present

## 2022-04-19 DIAGNOSIS — E1140 Type 2 diabetes mellitus with diabetic neuropathy, unspecified: Secondary | ICD-10-CM | POA: Diagnosis not present

## 2022-04-19 DIAGNOSIS — G8918 Other acute postprocedural pain: Secondary | ICD-10-CM | POA: Diagnosis not present

## 2022-04-19 DIAGNOSIS — M6281 Muscle weakness (generalized): Secondary | ICD-10-CM | POA: Diagnosis not present

## 2022-04-19 DIAGNOSIS — J449 Chronic obstructive pulmonary disease, unspecified: Secondary | ICD-10-CM | POA: Diagnosis not present

## 2022-04-19 DIAGNOSIS — M21962 Unspecified acquired deformity of left lower leg: Secondary | ICD-10-CM | POA: Diagnosis not present

## 2022-04-19 DIAGNOSIS — E039 Hypothyroidism, unspecified: Secondary | ICD-10-CM | POA: Diagnosis not present

## 2022-04-19 DIAGNOSIS — R262 Difficulty in walking, not elsewhere classified: Secondary | ICD-10-CM | POA: Diagnosis not present

## 2022-04-19 DIAGNOSIS — M6259 Muscle wasting and atrophy, not elsewhere classified, multiple sites: Secondary | ICD-10-CM | POA: Diagnosis not present

## 2022-04-19 DIAGNOSIS — L02611 Cutaneous abscess of right foot: Secondary | ICD-10-CM | POA: Diagnosis not present

## 2022-04-19 DIAGNOSIS — E08621 Diabetes mellitus due to underlying condition with foot ulcer: Secondary | ICD-10-CM | POA: Diagnosis not present

## 2022-04-19 DIAGNOSIS — M869 Osteomyelitis, unspecified: Secondary | ICD-10-CM | POA: Diagnosis not present

## 2022-04-19 DIAGNOSIS — Z741 Need for assistance with personal care: Secondary | ICD-10-CM | POA: Diagnosis not present

## 2022-04-19 LAB — GLUCOSE, CAPILLARY
Glucose-Capillary: 213 mg/dL — ABNORMAL HIGH (ref 70–99)
Glucose-Capillary: 159 mg/dL — ABNORMAL HIGH (ref 70–99)

## 2022-04-19 MED ORDER — INSULIN ASPART PROT & ASPART (70-30 MIX) 100 UNIT/ML ~~LOC~~ SUSP
30.0000 [IU] | Freq: Three times a day (TID) | SUBCUTANEOUS | Status: DC
  Administered 2022-04-19: 30 [IU] via SUBCUTANEOUS

## 2022-04-19 NOTE — Discharge Summary (Signed)
Physician Discharge Summary  Katherine Herrera JHE:[id number] DOB: [birth year] DOA: 04/08/2022  PCP: Cassandria Anger, MD  Admit date: 04/08/2022 Discharge date: 04/19/2022  Admitted From: Home Discharge disposition: SNF  Recommendations at discharge:  Continue wound VAC changes Monday Wednesday Friday per podiatry. Continue oral antibiotic (Augmentin) till 05/04/2022 with probiotics  Brief narrative: Katherine Herrera is a 56 y.o. female with PMH significant for gout, HTN, HLD, history of childhood seizures, chronic pain syndrome, chronic osteomyelitis of the right foot who was sent to the ED from wound care center for IV antibiotics due to worsening right foot wound, concern for worsening osteomyelitis. Patient initially placed on IV antibiotics.   Admitted to [facility].   Podiatry consulted.  See below for details  Subjective: Patient was seen and examined this morning.  Not in distress.  No new symptoms.     Principal Problem:   Osteomyelitis of foot ([place]) Active Problems:   AKI (acute kidney injury) ([place])   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 ([place])   Essential hypertension   Hypothyroidism   Pure hypercholesterolemia   Anxiety disorder   Chronic pain    Hospital course: Right foot osteomyelitis -6/11, patient underwent excisional debridement of the wound  -1/85, underwent application of Integra and wound VAC. -Per podiatry, patient will need wound VAC changes Monday Wednesday Friday. -Wound culture grew Staphylococcus simulans. -Initially improved with IV antibiotics.  Per ID recommendation, patient to continue oral Augmentin plan till 05/04/22  Type 2 diabetes mellitus Peripheral neuropathy -A1c 7.5 on 04/10/2022 -Home meds include 70/30 insulin 3 times a day (30, 30 and 40 units) -While in the hospital, patient was given less insulin because of perioperative poor appetite.  Her appetite improved.  Blood sugar level started to run elevated. -At discharge, I would  resume her home regimen with insulin 70/30 3 times a day.   Recent Labs  Lab 04/18/22 1128 04/18/22 1642 04/18/22 2115 04/19/22 0357 04/19/22 0753  GLUCAP 231* 316* 438* 213* 159*   AKI (acute kidney injury)  -Renal function improved with IV fluid Recent Labs    03/16/22 1057 04/08/22 1220 04/09/22 0052 04/10/22 0132 04/11/22 0657 04/12/22 0037 04/13/22 0012 04/13/22 2238 04/14/22 0911 04/15/22 0243  BUN '17 18 18 19 16 16 17 ' 27* 23* 17  CREATININE 1.18* 1.24* 1.08* 1.09* 0.99 1.01* 1.08* 1.56* 1.03* 0.96   Essential hypertension -Blood pressure currently stable.   -Lisinopril was held due to AKI.  At discharge, I would resume lisinopril because of coexisting diabetes mellitus. -Continue to monitor blood pressure.   Hypothyroidism -TSH normal in 08/2021 -Continue home synthroid    Pure hypercholesterolemia -Continue zetia. Statin intolerance.  -Recent LDL of 135    Anxiety disorder -Continue prozac and klonopin    Chronic pain -Continue home oxycodone.   COPD (chronic obstructive pulmonary disease) -Respiratory status stable.  Goals of care   Code Status: Full Code    Mobility: Encourage ambulation  Skin assessment:     Nutritional status:  Body mass index is 32.04 kg/m.         Wounds:  - Incision (Closed) 05/12/21 Foot (Active)  Date First Assessed/Time First Assessed: 05/12/21 1346   Location: Foot    Assessments 05/12/2021  1:48 PM 05/12/2021  2:50 PM  Dressing Type Gauze (Comment);Other (Comment) Gauze (Comment)  Dressing Clean;Dry;Intact Clean;Dry;Intact  Site / Wound Assessment Dressing in place / Unable to assess Dressing in place / Unable to assess  Drainage Amount None None  Treatment  Other (Comment) --     No associated orders.     Pressure Injury 11/27/21 Foot Anterior;Right Unstageable - Full thickness tissue loss in which the base of the injury is covered by slough (yellow, tan, gray, green or brown) and/or eschar (tan, brown  or black) in the wound bed. diabetic ulcer to medial  (Active)  Date First Assessed/Time First Assessed: 11/27/21 1610   Location: Foot  Location Orientation: Anterior;Right  Staging: Unstageable - Full thickness tissue loss in which the base of the injury is covered by slough (yellow, tan, gray, green or brown) a...    Assessments 11/27/2021  3:59 PM 11/28/2021  9:00 PM  Dressing Type Impregnated gauze (petrolatum);Foam - Lift dressing to assess site every shift Foam - Lift dressing to assess site every shift  Dressing Changed --  Dressing Change Frequency Daily --  Site / Wound Assessment Clean;Dry Dry;Clean  Wound Length (cm) 3.5 cm --  Wound Width (cm) 3 cm --  Wound Surface Area (cm^2) 10.5 cm^2 --  Drainage Amount None --  Treatment Cleansed --     No associated orders.     Incision (Closed) 04/10/22 Foot Right (Active)  Date First Assessed/Time First Assessed: 04/10/22 1102   Location: Foot  Location Orientation: Right    Assessments 04/10/2022  8:00 AM 04/19/2022  9:00 AM  Dressing Type Compression wrap Negative pressure wound therapy  Dressing Clean, Dry, Intact Clean, Dry, Intact  Site / Wound Assessment Dressing in place / Unable to assess --  Drainage Amount None --  Treatment -- Negative pressure wound therapy     No associated orders.     Negative Pressure Wound Therapy Foot Right (Active)  Placement Date/Time: 04/10/22 1047   Wound Type: Surgical (Open wound)  Location: Foot  Location Orientation: Right    Assessments 04/10/2022  7:45 PM 04/19/2022  9:00 AM  Site / Wound Assessment Dressing in place / Unable to assess --  Cycle Continuous Continuous  Target Pressure (mmHg) 125 125  Canister Changed -- No  Dressing Status Intact Intact  Drainage Amount Moderate --  Drainage Description Sanguineous --  Output (mL) 50 mL --     No associated orders.    Discharge Exam:   Vitals:   04/18/22 0724 04/18/22 1605 04/19/22 0353 04/19/22 0752  BP: 117/69 126/69 (!)  142/79 117/77  Pulse: 80 98 88 86  Resp: '16 16 18 18  ' Temp: (!) 97.3 F (36.3 C) 98.3 F (36.8 C) 98.4 F (36.9 C) 98 F (36.7 C)  TempSrc: Oral Oral Oral Oral  SpO2: 94% 94% 98% 94%  Weight:      Height:        Body mass index is 32.04 kg/m.  General exam: Pleasant, middle-aged Caucasian female. Skin: No rashes, lesions or ulcers. HEENT: Atraumatic, normocephalic, no obvious bleeding Lungs: Clear to auscultation bilaterally CVS: Regular rate and rhythm, normal GI/Abd soft, nontender, nondistended, bowel sound present CNS: Alert, awake, oriented x3 Psychiatry: Mood appropriate Extremities: No pedal edema, no calf tenderness.  Right foot with bandage and wound VAC.  Follow ups:    Follow-up Information     Plotnikov, [name], MD Follow up.   Specialty: Internal Medicine Contact information: [place] [address] [phone]         Plotnikov, [name], MD Follow up.   Specialty: Internal Medicine Contact information: [place] [address] [phone]         Felipa Furnace, DPM  Follow up.   Specialty: Podiatry Contact information: [place] [address] [phone]                 Discharge Instructions:   Discharge Instructions     Call MD for:  difficulty breathing, headache or visual disturbances   Complete by: As directed    Call MD for:  extreme fatigue   Complete by: As directed    Call MD for:  hives   Complete by: As directed    Call MD for:  persistant dizziness or light-headedness   Complete by: As directed    Call MD for:  persistant nausea and vomiting   Complete by: As directed    Call MD for:  severe uncontrolled pain   Complete by: As directed    Call MD for:  temperature >100.4   Complete by: As directed    Diet general   Complete by: As directed    Discharge instructions   Complete by: As directed    Recommendations at discharge:   Continue wound VAC  changes Monday Wednesday Friday per podiatry.  Continue oral antibiotic (Augmentin) till 05/04/2022 with probiotics  Discharge instructions for diabetes mellitus: Check blood sugar 3 times a day and bedtime at home. If blood sugar running above 200 or less than 70 please call your MD to adjust insulin. If you notice signs and symptoms of hypoglycemia (low blood sugar) like jitteriness, confusion, thirst, tremor and sweating, please check blood sugar, drink sugary drink/biscuits/sweets to increase sugar level and call MD or return to ER.    General discharge instructions: Follow with Primary MD Plotnikov, [name], MD in 7 days  Please request your PCP  to go over your hospital tests, procedures, radiology results at the follow up. Please get your medicines reviewed and adjusted.  Your PCP may decide to repeat certain labs or tests as needed. Do not drive, operate heavy machinery, perform activities at heights, swimming or participation in water activities or provide baby sitting services if your were admitted for syncope or siezures until you have seen by Primary MD or a Neurologist and advised to do so again. [place] Controlled Substance Reporting System database was reviewed. Do not drive, operate heavy machinery, perform activities at heights, swim, participate in water activities or provide baby-sitting services while on medications for pain, sleep and mood until your outpatient physician has reevaluated you and advised to do so again.  You are strongly recommended to comply with the dose, frequency and duration of prescribed medications. Activity: As tolerated with Full fall precautions use walker/cane & assistance as needed Avoid using any recreational substances like cigarette, tobacco, alcohol, or non-prescribed drug. If you experience worsening of your admission symptoms, develop shortness of breath, life threatening emergency, suicidal or homicidal thoughts you must seek medical  attention immediately by calling 911 or calling your MD immediately  if symptoms less severe. You must read complete instructions/literature along with all the possible adverse reactions/side effects for all the medicines you take and that have been prescribed to you. Take any new medicine only after you have completely understood and accepted all the possible adverse reactions/side effects.  Wear Seat belts while driving. You were cared for by a hospitalist during your hospital stay. If you have any questions about your discharge medications or the care you received while you were in the hospital after you are discharged, you can call the unit and ask to speak with the hospitalist or the  covering physician. Once you are discharged, your primary care physician will handle any further medical issues. Please note that NO REFILLS for any discharge medications will be authorized once you are discharged, as it is imperative that you return to your primary care physician (or establish a relationship with a primary care physician if you do not have one).   Discharge wound care:   Complete by: As directed    Increase activity slowly   Complete by: As directed        Discharge Medications:   Allergies as of 04/19/2022       Reactions   Cephalexin Nausea And Vomiting   Demerol  [meperidine Hcl] Rash   Lovastatin Other (See Comments)   Possible myalgia   Metformin And Related Nausea And Vomiting   Sulfamethoxazole-trimethoprim    Other reaction(s): Unknown DILI, pancreatitis   Crestor [rosuvastatin Calcium]    myalgia   Hydromorphone    Other reaction(s): Unknown   Niacin Other (See Comments)   Unknown    Other Other (See Comments)   Hydromorphone Hcl Itching   Patient has been tolerating Hydromorphone tablets without adverse effect (07/09/19) Other reaction(s): Unknown   Paroxetine Other (See Comments)   Unknown         Medication List     STOP taking these medications    glucose  blood test strip Commonly known as: OneTouch Verio       TAKE these medications    accu-chek softclix lancets Test two times daily   amoxicillin-clavulanate 875-125 MG tablet Commonly known as: AUGMENTIN Take 1 tablet by mouth every 12 (twelve) hours for 16 days.   B-D INS SYR ULTRAFINE 1CC/31G 31G X 5/16" 1 ML Misc Generic drug: Insulin Syringe-Needle U-100 USE AS DIRECTED What changed: Another medication with the same name was removed. Continue taking this medication, and follow the directions you see here.   baclofen 10 MG tablet Commonly known as: LIORESAL Take 2 tablets (20 mg total) by mouth 2 (two) times daily as needed for up to 5 days for muscle spasms. What changed:  when to take this reasons to take this   bisacodyl 5 MG EC tablet Commonly known as: DULCOLAX Take 10 mg by mouth at bedtime.   cholecalciferol 25 MCG (1000 UNIT) tablet Commonly known as: VITAMIN D3 Take 1,000 Units by mouth daily.   clonazePAM 0.5 MG tablet Commonly known as: KLONOPIN Take 1 tablet (0.5 mg total) by mouth 2 (two) times daily as needed for up to 5 days for anxiety.   colchicine 0.6 MG tablet TAKE 2 TABLETS AS NEEDED GOUT ATTACK. THEN TAKE ANOTHER 1 TAB IN 1-2 HOURS. DONT REPEAT FOR 3 DAYS. What changed:  how much to take how to take this when to take this reasons to take this additional instructions   docusate sodium 100 MG capsule Commonly known as: COLACE Take 100 mg by mouth at bedtime.   ezetimibe 10 MG tablet Commonly known as: ZETIA TAKE 1 TABLET BY MOUTH  DAILY   FLUoxetine 10 MG capsule Commonly known as: PROZAC Take 1 capsule (10 mg total) by mouth daily.   gabapentin 800 MG tablet Commonly known as: NEURONTIN TAKE 1 TABLET BY MOUTH 4  TIMES DAILY What changed: when to take this   insulin NPH-regular Human (70-30) 100 UNIT/ML injection Inject 20 Units into the skin 2 (two) times daily with a meal. What changed:  how much to take when to take  this additional instructions   lamoTRIgine 25  MG tablet Commonly known as: LAMICTAL TAKE 1 TABLET BY MOUTH  TWICE DAILY   levothyroxine 75 MCG tablet Commonly known as: SYNTHROID TAKE 1 TABLET BY MOUTH  DAILY What changed: when to take this   lisinopril 10 MG tablet Commonly known as: ZESTRIL Take 10 mg by mouth daily.   loratadine 10 MG tablet Commonly known as: CLARITIN Take 1 tablet (10 mg total) by mouth daily as needed for allergies.   ondansetron 4 MG tablet Commonly known as: ZOFRAN Take 4 mg by mouth every 8 (eight) hours as needed for nausea or vomiting.   OneTouch Delica Plus [id number] Misc   OneTouch Verio w/Device Kit   oxyCODONE 15 MG immediate release tablet Commonly known as: ROXICODONE Take 1 tablet (15 mg total) by mouth every 6 (six) hours as needed for up to 5 days for pain.   rOPINIRole 0.5 MG tablet Commonly known as: REQUIP TAKE 1 TABLET BY MOUTH AT  BEDTIME What changed: when to take this   silver sulfADIAZINE 1 % cream Commonly known as: SILVADENE APPLY PEA-SIZED AMOUNT TO WOUND DAILY.   vitamin B-12 1000 MCG tablet Commonly known as: CYANOCOBALAMIN Take 1,000 mcg by mouth daily.               Discharge Care Instructions  (From admission, onward)           Start     Ordered   04/19/22 0000  Discharge wound care:        04/19/22 1028             The results of significant diagnostics from this hospitalization (including imaging, microbiology, ancillary and laboratory) are listed below for reference.    Procedures and Diagnostic Studies:   DG Foot Complete Right  Result Date: 04/08/2022 CLINICAL DATA:  Right foot pain. EXAM: RIGHT FOOT COMPLETE - 3+ VIEW COMPARISON:  Right foot x-rays dated Mar 04, 2022. MRI right foot dated Mar 11, 2022. FINDINGS: Similar degree of chronic bony destruction involving the base of fifth metatarsal and cuboid. No progressive bony destruction. Prior partial amputation of the distal fifth  metatarsal. No acute fracture or dislocation. Unchanged soft tissue wound along the lateral mid and forefoot. IMPRESSION: 1. Similar degree of chronic osteomyelitis involving the base of the fifth metatarsal and cuboid. No progressive bony destruction. Electronically Signed   By: Titus Dubin M.D.   On: 04/08/2022 14:30     Labs:   Basic Metabolic Panel: Recent Labs  Lab 04/13/22 0012 04/13/22 2238 04/14/22 0911 04/15/22 0243 04/16/22 2158  NA 135 131* 133* 139  --   K 4.4 6.1* 4.6 4.5  --   CL 96* 94* 98 105  --   CO2 '30 25 25 26  ' --   GLUCOSE 209* 428*  426* 233* 116* 410*  BUN 17 27* 23* 17  --   CREATININE 1.08* 1.56* 1.03* 0.96  --   CALCIUM 9.0 9.0 8.9 8.6*  --    GFR Estimated Creatinine Clearance: 81.7 mL/min (by C-G formula based on SCr of 0.96 mg/dL). Liver Function Tests: No results for input(s): "AST", "ALT", "ALKPHOS", "BILITOT", "PROT", "ALBUMIN" in the last 168 hours. No results for input(s): "LIPASE", "AMYLASE" in the last 168 hours. No results for input(s): "AMMONIA" in the last 168 hours. Coagulation profile No results for input(s): "INR", "PROTIME" in the last 168 hours.  CBC: Recent Labs  Lab 04/13/22 0012 04/13/22 2238 04/14/22 0911 04/15/22 0243  WBC 11.5* 16.0* 11.4* 13.4*  NEUTROABS  7.4 12.4* 7.8* 8.3*  HGB 14.9 15.1* 14.9 14.0  HCT 46.1* 46.7* 44.9 42.1  MCV 84.9 85.1 84.2 85.1  PLT 299 340 282 303   Cardiac Enzymes: No results for input(s): "CKTOTAL", "CKMB", "CKMBINDEX", "TROPONINI" in the last 168 hours. BNP: Invalid input(s): "POCBNP" CBG: Recent Labs  Lab 04/18/22 1128 04/18/22 1642 04/18/22 2115 04/19/22 0357 04/19/22 0753  GLUCAP 231* 316* 438* 213* 159*   D-Dimer No results for input(s): "DDIMER" in the last 72 hours. Hgb A1c No results for input(s): "HGBA1C" in the last 72 hours. Lipid Profile No results for input(s): "CHOL", "HDL", "LDLCALC", "TRIG", "CHOLHDL", "LDLDIRECT" in the last 72 hours. Thyroid function  studies No results for input(s): "TSH", "T4TOTAL", "T3FREE", "THYROIDAB" in the last 72 hours.  Invalid input(s): "FREET3" Anemia work up No results for input(s): "VITAMINB12", "FOLATE", "FERRITIN", "TIBC", "IRON", "RETICCTPCT" in the last 72 hours. Microbiology Recent Results (from the past 240 hour(s))  Surgical PCR screen     Status: None   Collection Time: 04/09/22  7:20 PM   Specimen: Nasal Mucosa; Nasal Swab  Result Value Ref Range Status   MRSA, PCR NEGATIVE NEGATIVE Final   Staphylococcus aureus NEGATIVE NEGATIVE Final    Comment: (NOTE) The Xpert SA Assay (FDA approved for NASAL specimens in patients 29 years of age and older), is one component of a comprehensive surveillance program. It is not intended to diagnose infection nor to guide or monitor treatment. Performed at [facility], [place] [address], [place], [place] [address]   Aerobic/Anaerobic Culture w Gram Stain (surgical/deep wound)     Status: None   Collection Time: 04/10/22 10:31 AM   Specimen: Wound  Result Value Ref Range Status   Specimen Description WOUND  Final   Special Requests RIGHT FOOT  Final   Gram Stain   Final    FEW WBC PRESENT, PREDOMINANTLY MONONUCLEAR RARE GRAM POSITIVE COCCI Performed at [facility], [address], [place], [place] [address]    Culture   Final    RARE STAPHYLOCOCCUS SIMULANS MIXED ANAEROBIC FLORA PRESENT.  CALL LAB IF FURTHER IID REQUIRED.    Report Status 04/15/2022 FINAL  Final   Organism ID, Bacteria STAPHYLOCOCCUS SIMULANS  Final      Susceptibility   Staphylococcus simulans - MIC*    CIPROFLOXACIN <=0.5 SENSITIVE Sensitive     ERYTHROMYCIN >=8 RESISTANT Resistant     GENTAMICIN <=0.5 SENSITIVE Sensitive     OXACILLIN <=0.25 SENSITIVE Sensitive     TETRACYCLINE >=16 RESISTANT Resistant     VANCOMYCIN <=0.5 SENSITIVE Sensitive     TRIMETH/SULFA <=10 SENSITIVE Sensitive     CLINDAMYCIN >=8 RESISTANT Resistant     RIFAMPIN <=0.5 SENSITIVE Sensitive      Inducible Clindamycin NEGATIVE Sensitive     * RARE STAPHYLOCOCCUS SIMULANS  Culture, fungus without smear     Status: None (Preliminary result)   Collection Time: 04/10/22 10:31 AM   Specimen: Tissue; Other  Result Value Ref Range Status   Specimen Description TISSUE  Final   Special Requests NONE  Final   Culture   Final    NO FUNGUS ISOLATED AFTER 7 DAYS Performed at [facility], [address], [place],  [address]    Report Status PENDING  Incomplete  Urine Culture     Status: None   Collection Time: 04/14/22  8:47 AM   Specimen: Urine, Catheterized  Result Value Ref Range Status   Specimen Description URINE, CATHETERIZED  Final  Special Requests NONE  Final   Culture   Final    NO GROWTH Performed at [facility], [place] [address] [place], [place] [address]    Report Status 04/15/2022 FINAL  Final    Time coordinating discharge: 35 minutes  Signed: Jaylaa Gallion  [facility] 04/19/2022, 10:29 AM

## 2022-04-19 NOTE — Progress Notes (Signed)
Report called to [place] at [facility]. Aware that pt will be coming with a KCI wound vac in place. WC transport will arrive at 12:30 per SW. Pt agreeable to plan.

## 2022-04-19 NOTE — Consult Note (Signed)
   [facility] Inpatient Consult   04/19/2022  TANIYA DASHER 07/13/1966 [id number]  [place] [facility] [ACO] Patient: [employer]  Primary Care Provider:  Cassandria Anger, MD, [place], is an embedded provider with a Chronic Care Management team and program, and is listed for the transition of care follow up and appointments.  Patient was active with Embedded chronic care management prior to admission  Patient for post hospital care at [facility] noted per inpatient [facility] team notes.  Plan: Notification sent to the [place] Management and made aware of TOC needs for post hospital needs. Patient can be followed by [facility] RN for post facility needs,as well.  Please contact for further questions,  Natividad Brood, RN BSN [facility] Liaison  [phone] business mobile phone Toll free office [phone]  Fax number: [phone] [email or web address]

## 2022-04-19 NOTE — Progress Notes (Signed)
   04/18/22 2124  Complaints & Interventions  Complains of Anxiety  Neuro symptoms relieved by Anti-anxiety medication  Provider Notification  Provider Name/Title Clarene Essex  Date Provider Notified 04/18/22  Time Provider Notified 2121  Notification Reason Critical result  Test performed and critical result glucose 438  Date Critical Result Received 04/18/22  Time Critical Result Received 2130  Provider response See new orders  Date of Provider Response 04/18/22  Time of Provider Response 2143

## 2022-04-19 NOTE — Progress Notes (Signed)
Pt placed on portable KCI wound vac for DC to SNF. All wound vac supplies and DC packet/AVS at pt bedside. Pt will be transported off unit via [place] with all belongings.

## 2022-04-20 DIAGNOSIS — G8918 Other acute postprocedural pain: Secondary | ICD-10-CM | POA: Diagnosis not present

## 2022-04-21 LAB — ACID FAST SMEAR (AFB, MYCOBACTERIA): Acid Fast Smear: NEGATIVE

## 2022-04-25 ENCOUNTER — Other Ambulatory Visit: Payer: Self-pay | Admitting: *Deleted

## 2022-04-25 NOTE — Patient Outreach (Signed)
Per [facility] eligible member currently resides in [facility].  Screened for potential care coordination/care management services as a benefit of member's insurance plan.  Ms. Zellman is active with [facility] Care Management services with [facility] RNCM.   Ms. Mondi admitted to SNF on 04/19/22 after hospitalization.  Facility site visit to [facility] skilled nursing facility. Met with Ms. Vanevery at bedside. Ms. Bruggeman states she is hopeful she will transition home on this Friday. States she has post surgery appointment scheduled this Thursday. Ms. Cregar reports she lives alone. However, her daughter lives 5 minutes away. States her daughter Jerrye Noble will check on her frequently. Ms. Hurlbert reports she will need mobile meals post SNF thru [facility]. Reports transportation to medical appointments are provided by [facility] transportation. States she gives 5 day advance notice when transportation is needed. Discussed writer will send referral to [facility] care guide for assistance with mobile meals with [facility] once transition date is confirmed. Ms. Damien also gives Clinical research associate permission to contact daughter Jerrye Noble if needed.   Will update [facility] RNCM. Will make referral to [facility] Care Guide for mobile meals once transition date is known.  Spoke with Garth Schlatter, SNF SW to make aware writer is following for [facility] services.   Raiford Noble, MSN, RN,BSN [facility] Post Acute Care Coordinator [phone] [facility]) [phone]  (Toll free office)

## 2022-04-26 DIAGNOSIS — M869 Osteomyelitis, unspecified: Secondary | ICD-10-CM | POA: Diagnosis not present

## 2022-04-26 DIAGNOSIS — Z741 Need for assistance with personal care: Secondary | ICD-10-CM | POA: Diagnosis not present

## 2022-04-26 DIAGNOSIS — M6281 Muscle weakness (generalized): Secondary | ICD-10-CM | POA: Diagnosis not present

## 2022-04-26 DIAGNOSIS — E039 Hypothyroidism, unspecified: Secondary | ICD-10-CM | POA: Diagnosis not present

## 2022-04-27 ENCOUNTER — Ambulatory Visit (INDEPENDENT_AMBULATORY_CARE_PROVIDER_SITE_OTHER): Payer: Medicare Other | Admitting: Podiatry

## 2022-04-27 DIAGNOSIS — L97412 Non-pressure chronic ulcer of right heel and midfoot with fat layer exposed: Secondary | ICD-10-CM

## 2022-04-27 DIAGNOSIS — E11621 Type 2 diabetes mellitus with foot ulcer: Secondary | ICD-10-CM

## 2022-04-28 ENCOUNTER — Other Ambulatory Visit: Payer: Self-pay | Admitting: *Deleted

## 2022-04-28 ENCOUNTER — Telehealth: Payer: Medicare Other

## 2022-04-28 DIAGNOSIS — Z741 Need for assistance with personal care: Secondary | ICD-10-CM | POA: Diagnosis not present

## 2022-04-28 DIAGNOSIS — M8660 Other chronic osteomyelitis, unspecified site: Secondary | ICD-10-CM

## 2022-04-28 DIAGNOSIS — M869 Osteomyelitis, unspecified: Secondary | ICD-10-CM | POA: Diagnosis not present

## 2022-04-28 DIAGNOSIS — M6281 Muscle weakness (generalized): Secondary | ICD-10-CM | POA: Diagnosis not present

## 2022-04-28 DIAGNOSIS — E039 Hypothyroidism, unspecified: Secondary | ICD-10-CM | POA: Diagnosis not present

## 2022-04-28 NOTE — Patient Outreach (Addendum)
[facility] Post- Acute Care Coordinator follow up. Katherine Herrera currently resides in [facility].   Telephone call received from Katherine Herrera who states she is indeed returning home on tomorrow, Friday, June 30th. States she will have home health and will go home with wound vac. She cannot remember name of home health agency. Writer to follow up with [facility] SW to inquire about hhc agency name. Katherine Herrera remains agreeable for referral for mobile meals thru [facility]. Address on file is correct. Katherine Herrera will benefit from Diabetes-Friendly meals.   Discussed with Katherine Herrera with the upcoming holiday, there may be somewhat of a delay with meals. Katherine Herrera expressed understanding.   Will make referral to [facility] care guide for MOMS meals. Will update [place] Coordinator of Katherine Herrera's pending discharge from SNF on 04/29/22. Katherine Herrera was active with [facility] prior to admission.   Katherine Herrera telephone number is [phone].  Please see writer's notes from 04/25/22 for additional details.    Katherine Rolling, MSN, RN,BSN [facility] Acute Care Coordinator [phone] [facility]) [phone]  (Toll free office)

## 2022-04-29 ENCOUNTER — Telehealth: Payer: Self-pay | Admitting: *Deleted

## 2022-04-29 NOTE — Telephone Encounter (Signed)
   Telephone encounter was:  Successful.  04/29/2022 Name: Katherine Herrera MRN: [id number] DOB: 01/01/1966  Katherine Herrera is a 56 y.o. year old female who is a primary care patient of Plotnikov, [name], MD . The community resource team was consulted for assistance with [place] guide performed the following interventions: Patient provided with information about care guide support team and interviewed to confirm resource needs.  patient home from [place] and have assessed sdoh needs none noted will email in referral for [place] health care Moms Meals  Follow Up Plan:  No further follow up planned at this time. The patient has been provided with needed resources.  [place], Care Management  [phone] [address] , [place] [address] Email : [email or web address]

## 2022-05-02 DIAGNOSIS — N179 Acute kidney failure, unspecified: Secondary | ICD-10-CM | POA: Diagnosis not present

## 2022-05-02 DIAGNOSIS — J449 Chronic obstructive pulmonary disease, unspecified: Secondary | ICD-10-CM | POA: Diagnosis not present

## 2022-05-02 DIAGNOSIS — B957 Other staphylococcus as the cause of diseases classified elsewhere: Secondary | ICD-10-CM | POA: Diagnosis not present

## 2022-05-02 DIAGNOSIS — G43909 Migraine, unspecified, not intractable, without status migrainosus: Secondary | ICD-10-CM | POA: Diagnosis not present

## 2022-05-02 DIAGNOSIS — E538 Deficiency of other specified B group vitamins: Secondary | ICD-10-CM | POA: Diagnosis not present

## 2022-05-02 DIAGNOSIS — M19041 Primary osteoarthritis, right hand: Secondary | ICD-10-CM | POA: Diagnosis not present

## 2022-05-02 DIAGNOSIS — G894 Chronic pain syndrome: Secondary | ICD-10-CM | POA: Diagnosis not present

## 2022-05-02 DIAGNOSIS — E559 Vitamin D deficiency, unspecified: Secondary | ICD-10-CM | POA: Diagnosis not present

## 2022-05-02 DIAGNOSIS — K5909 Other constipation: Secondary | ICD-10-CM | POA: Diagnosis not present

## 2022-05-02 DIAGNOSIS — Z9181 History of falling: Secondary | ICD-10-CM | POA: Diagnosis not present

## 2022-05-02 DIAGNOSIS — M545 Low back pain, unspecified: Secondary | ICD-10-CM | POA: Diagnosis not present

## 2022-05-02 DIAGNOSIS — I10 Essential (primary) hypertension: Secondary | ICD-10-CM | POA: Diagnosis not present

## 2022-05-02 DIAGNOSIS — M86671 Other chronic osteomyelitis, right ankle and foot: Secondary | ICD-10-CM | POA: Diagnosis not present

## 2022-05-02 DIAGNOSIS — M109 Gout, unspecified: Secondary | ICD-10-CM | POA: Diagnosis not present

## 2022-05-02 DIAGNOSIS — Z794 Long term (current) use of insulin: Secondary | ICD-10-CM | POA: Diagnosis not present

## 2022-05-02 DIAGNOSIS — Z981 Arthrodesis status: Secondary | ICD-10-CM | POA: Diagnosis not present

## 2022-05-02 DIAGNOSIS — M19042 Primary osteoarthritis, left hand: Secondary | ICD-10-CM | POA: Diagnosis not present

## 2022-05-02 DIAGNOSIS — L97518 Non-pressure chronic ulcer of other part of right foot with other specified severity: Secondary | ICD-10-CM | POA: Diagnosis not present

## 2022-05-02 DIAGNOSIS — E1169 Type 2 diabetes mellitus with other specified complication: Secondary | ICD-10-CM | POA: Diagnosis not present

## 2022-05-02 DIAGNOSIS — F17210 Nicotine dependence, cigarettes, uncomplicated: Secondary | ICD-10-CM | POA: Diagnosis not present

## 2022-05-02 DIAGNOSIS — E11621 Type 2 diabetes mellitus with foot ulcer: Secondary | ICD-10-CM | POA: Diagnosis not present

## 2022-05-02 DIAGNOSIS — E7800 Pure hypercholesterolemia, unspecified: Secondary | ICD-10-CM | POA: Diagnosis not present

## 2022-05-02 DIAGNOSIS — E1142 Type 2 diabetes mellitus with diabetic polyneuropathy: Secondary | ICD-10-CM | POA: Diagnosis not present

## 2022-05-02 DIAGNOSIS — E039 Hypothyroidism, unspecified: Secondary | ICD-10-CM | POA: Diagnosis not present

## 2022-05-02 LAB — CULTURE, FUNGUS WITHOUT SMEAR

## 2022-05-04 ENCOUNTER — Ambulatory Visit (INDEPENDENT_AMBULATORY_CARE_PROVIDER_SITE_OTHER): Payer: Medicare Other | Admitting: *Deleted

## 2022-05-04 DIAGNOSIS — E785 Hyperlipidemia, unspecified: Secondary | ICD-10-CM

## 2022-05-04 DIAGNOSIS — G894 Chronic pain syndrome: Secondary | ICD-10-CM | POA: Diagnosis not present

## 2022-05-04 DIAGNOSIS — E7800 Pure hypercholesterolemia, unspecified: Secondary | ICD-10-CM | POA: Diagnosis not present

## 2022-05-04 DIAGNOSIS — E559 Vitamin D deficiency, unspecified: Secondary | ICD-10-CM | POA: Diagnosis not present

## 2022-05-04 DIAGNOSIS — E1142 Type 2 diabetes mellitus with diabetic polyneuropathy: Secondary | ICD-10-CM | POA: Diagnosis not present

## 2022-05-04 DIAGNOSIS — L97518 Non-pressure chronic ulcer of other part of right foot with other specified severity: Secondary | ICD-10-CM | POA: Diagnosis not present

## 2022-05-04 DIAGNOSIS — M19042 Primary osteoarthritis, left hand: Secondary | ICD-10-CM | POA: Diagnosis not present

## 2022-05-04 DIAGNOSIS — E039 Hypothyroidism, unspecified: Secondary | ICD-10-CM | POA: Diagnosis not present

## 2022-05-04 DIAGNOSIS — E11621 Type 2 diabetes mellitus with foot ulcer: Secondary | ICD-10-CM

## 2022-05-04 DIAGNOSIS — N179 Acute kidney failure, unspecified: Secondary | ICD-10-CM | POA: Diagnosis not present

## 2022-05-04 DIAGNOSIS — K5909 Other constipation: Secondary | ICD-10-CM | POA: Diagnosis not present

## 2022-05-04 DIAGNOSIS — L02611 Cutaneous abscess of right foot: Secondary | ICD-10-CM | POA: Diagnosis not present

## 2022-05-04 DIAGNOSIS — Z9181 History of falling: Secondary | ICD-10-CM | POA: Diagnosis not present

## 2022-05-04 DIAGNOSIS — B957 Other staphylococcus as the cause of diseases classified elsewhere: Secondary | ICD-10-CM | POA: Diagnosis not present

## 2022-05-04 DIAGNOSIS — E1169 Type 2 diabetes mellitus with other specified complication: Secondary | ICD-10-CM | POA: Diagnosis not present

## 2022-05-04 DIAGNOSIS — M86671 Other chronic osteomyelitis, right ankle and foot: Secondary | ICD-10-CM | POA: Diagnosis not present

## 2022-05-04 DIAGNOSIS — M545 Low back pain, unspecified: Secondary | ICD-10-CM | POA: Diagnosis not present

## 2022-05-04 DIAGNOSIS — Z794 Long term (current) use of insulin: Secondary | ICD-10-CM | POA: Diagnosis not present

## 2022-05-04 DIAGNOSIS — I10 Essential (primary) hypertension: Secondary | ICD-10-CM | POA: Diagnosis not present

## 2022-05-04 DIAGNOSIS — E538 Deficiency of other specified B group vitamins: Secondary | ICD-10-CM | POA: Diagnosis not present

## 2022-05-04 DIAGNOSIS — G43909 Migraine, unspecified, not intractable, without status migrainosus: Secondary | ICD-10-CM | POA: Diagnosis not present

## 2022-05-04 DIAGNOSIS — F17210 Nicotine dependence, cigarettes, uncomplicated: Secondary | ICD-10-CM | POA: Diagnosis not present

## 2022-05-04 DIAGNOSIS — M19041 Primary osteoarthritis, right hand: Secondary | ICD-10-CM | POA: Diagnosis not present

## 2022-05-04 DIAGNOSIS — E08621 Diabetes mellitus due to underlying condition with foot ulcer: Secondary | ICD-10-CM | POA: Diagnosis not present

## 2022-05-04 DIAGNOSIS — J449 Chronic obstructive pulmonary disease, unspecified: Secondary | ICD-10-CM | POA: Diagnosis not present

## 2022-05-04 DIAGNOSIS — E1140 Type 2 diabetes mellitus with diabetic neuropathy, unspecified: Secondary | ICD-10-CM

## 2022-05-04 DIAGNOSIS — M109 Gout, unspecified: Secondary | ICD-10-CM | POA: Diagnosis not present

## 2022-05-04 DIAGNOSIS — Z981 Arthrodesis status: Secondary | ICD-10-CM | POA: Diagnosis not present

## 2022-05-04 NOTE — Chronic Care Management (AMB) (Addendum)
Chronic Care Management   CCM RN Visit Note  05/04/2022 Name: Katherine Herrera MRN: [id number] DOB: [birth year]  Subjective: Katherine Herrera is a 56 y.o. year old female who is a primary care patient of Plotnikov, [name], MD. The care management team was consulted for assistance with disease management and care coordination needs.    Engaged with patient by telephone for follow up visit/ CCM RN CM case closure in response to provider referral for case management and/or care coordination services.   Consent to Services:  The patient was given information about Chronic Care Management services, agreed to services, and gave verbal consent prior to initiation of services.  Please see initial visit note for detailed documentation.  Patient agreed to services and verbal consent obtained.   Assessment: Review of patient past medical history, allergies, medications, health status, including review of consultants reports, laboratory and other test data, was performed as part of comprehensive evaluation and provision of chronic care management services.   SDOH (Social Determinants of Health) assessments and interventions performed:  SDOH Interventions    Flowsheet Row Most Recent Value  SDOH Interventions   Food Insecurity Interventions Other (Comment)  [confirmed [employer] has outreached patient and is currently assisting with food insecurity/ MOMs Meals through insurance benefit]  Housing Interventions Intervention Not Indicated  [lives alone in ground level apartment,  denies housing concerns]  Transportation Interventions Intervention Not Indicated  [continues to obtain transportation through insurance provider,  family currently assisting as well- denies transportation [place]  Allergies  Allergen Reactions   Cephalexin Nausea And Vomiting   Demerol  [Meperidine Hcl] Rash   Lovastatin Other (See Comments)    Possible myalgia    Metformin And  Related Nausea And Vomiting   Sulfamethoxazole-Trimethoprim     Other reaction(s): Unknown DILI, pancreatitis   Crestor [Rosuvastatin Calcium]     myalgia   Hydromorphone     Other reaction(s): Unknown   Niacin Other (See Comments)    Unknown    Other Other (See Comments)   Hydromorphone Hcl Itching    Patient has been tolerating Hydromorphone tablets without adverse effect (07/09/19) Other reaction(s): Unknown   Paroxetine Other (See Comments)    Unknown    Outpatient Encounter Medications as of 05/04/2022  Medication Sig Note   amoxicillin-clavulanate (AUGMENTIN) 875-125 MG tablet Take 1 tablet by mouth every 12 (twelve) hours for 16 days.    B-D INS SYR ULTRAFINE 1CC/31G 31G X 5/16" 1 ML MISC USE AS DIRECTED    bisacodyl (DULCOLAX) 5 MG EC tablet Take 10 mg by mouth at bedtime.    Blood Glucose Monitoring Suppl (ONETOUCH VERIO) w/Device KIT     cholecalciferol (VITAMIN D3) 25 MCG (1000 UNIT) tablet Take 1,000 Units by mouth daily.    clonazePAM (KLONOPIN) 0.5 MG tablet Take 1 tablet (0.5 mg total) by mouth 2 (two) times daily as needed for up to 5 days for anxiety.    colchicine 0.6 MG tablet TAKE 2 TABLETS AS NEEDED GOUT ATTACK. THEN TAKE ANOTHER 1 TAB IN 1-2 HOURS. DONT REPEAT FOR 3 DAYS. (Patient taking differently: Take 0.6 mg by mouth daily as needed (gout).)    docusate sodium (COLACE) 100 MG capsule Take 100 mg by mouth at bedtime.    ezetimibe (ZETIA) 10 MG tablet TAKE 1 TABLET BY MOUTH  DAILY (Patient taking differently: Take 10 mg by mouth daily.)    FLUoxetine (PROZAC) 10 MG capsule Take 1  capsule (10 mg total) by mouth daily.    gabapentin (NEURONTIN) 800 MG tablet TAKE 1 TABLET BY MOUTH 4  TIMES DAILY (Patient taking differently: Take 800 mg by mouth 3 (three) times daily.) 04/08/2022: Pharmacy record says qid   insulin NPH-regular Human (70-30) 100 UNIT/ML injection Inject 20 Units into the skin 2 (two) times daily with a meal. (Patient taking differently: Inject 30-40 Units  into the skin See admin instructions. 30 units in the morning and afternoon and 40 units at night)    lamoTRIgine (LAMICTAL) 25 MG tablet TAKE 1 TABLET BY MOUTH  TWICE DAILY (Patient taking differently: Take 25 mg by mouth 2 (two) times daily.)    Lancet Devices (ACCU-CHEK SOFTCLIX) lancets Test two times daily    Lancets (ONETOUCH DELICA PLUS [id number]) MISC     levothyroxine (SYNTHROID) 75 MCG tablet TAKE 1 TABLET BY MOUTH  DAILY (Patient taking differently: Take 75 mcg by mouth daily before breakfast.)    lisinopril (ZESTRIL) 10 MG tablet Take 10 mg by mouth daily.    loratadine (CLARITIN) 10 MG tablet Take 1 tablet (10 mg total) by mouth daily as needed for allergies.    ondansetron (ZOFRAN) 4 MG tablet Take 4 mg by mouth every 8 (eight) hours as needed for nausea or vomiting.    rOPINIRole (REQUIP) 0.5 MG tablet TAKE 1 TABLET BY MOUTH AT  BEDTIME (Patient taking differently: Take 0.5 mg by mouth daily.)    silver sulfADIAZINE (SILVADENE) 1 % cream APPLY PEA-SIZED AMOUNT TO WOUND DAILY. (Patient not taking: Reported on 04/08/2022)    vitamin B-12 (CYANOCOBALAMIN) 1000 MCG tablet Take 1,000 mcg by mouth daily.    No facility-administered encounter medications on file as of 05/04/2022.   Patient Active Problem List   Diagnosis Date Noted   Osteomyelitis of foot ([place]) 04/08/2022   Finger pain, right 02/23/2022   Depression 02/23/2022   Thyroid nodule 09/09/2021   Statin myopathy 08/06/2021   Essential hypertension 10/09/2020   Pure hypercholesterolemia 10/09/2020   Chronic osteomyelitis involving right ankle and foot ([place])    Cellulitis of right lower leg 01/10/2020   Cellulitis 01/02/2020   Peroneal tendonitis, right    Capsulitis of metatarsophalangeal (MTP) joint of right foot    Exostosis of bone of foot    Polyneuropathy due to type 2 diabetes mellitus ([place]) 10/01/2018   Chronic pain 09/29/2018   Cellulitis of left lower leg    Cerumen impaction 08/02/2017   Constipation due to  pain medication 05/22/2017   AKI (acute kidney injury) ([place]) 05/03/2017   Charcot foot due to diabetes mellitus ([place]) 04/23/2017   Pain from implanted hardware 01/06/2016   Cellulitis and abscess of toe of right foot 07/24/2015   DOE (dyspnea on exertion) 06/16/2015   Chest pain, atypical 06/16/2015   Family history of musculoskeletal disease 03/12/2015   Non-compliant behavior 12/24/2014   Callus of foot 03/25/2014   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   Metatarsal deformity, left 09/03/2013   Pes cavus 10/13/2012   Acquired tight Achilles tendon 10/13/2012   Paronychia 06/14/2012   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) 03/19/2012   Allergic rhinitis 03/19/2012   TTS (tarsal tunnel syndrome)    ARTHRALGIA 01/04/2010   CBC, ABNORMAL 05/01/2009   B12 deficiency 01/05/2009   GOUT 01/05/2009   Anxiety disorder 07/28/2008   TOBACCO USE DISORDER/SMOKER-SMOKING CESSATION DISCUSSED 04/21/2008   FOOT PAIN 04/21/2008   Diabetic neuropathy ([place]) 08/27/2007   LOW BACK PAIN 08/27/2007   Dyslipidemia 08/20/2007  Hypothyroidism 05/22/2007   Conditions to be addressed/monitored:  HLD and DMII  Care Plan : RN Care Manager Plan of Care  Updates made by Knox Royalty, RN since 05/04/2022 12:00 AM     Problem: Chronic Disease Management Needs   Priority: High     Long-Range Goal: Ongoing development of plan of care for long term chronic disease management   Start Date: 05/27/2021  Expected End Date: 05/27/2022  Priority: High  Note:   Current Barriers:  Chronic Disease Management support and education needs related to HLD and DMII Knowledge Deficits related to basic Diabetes pathophysiology and self care/management- requires ongoing reinforcement, education, and support Occasionally forgets insulin- midday dose ("especially" when family visiting): 05/04/22- patient reports this barrier is now resolved Lack of motivation to adherence to established plan of care  08/30/21:  patient reports she has not been monitoring/ recording blood sugars at home: unclear reasons: "I just haven't been:"  09/13/21-- reports has now started monitoring/ recording at home 08/30/21: ongoing diabetic non-healing ulceration wound to (R) foot per podiatry provider; 05/04/22: continues followed regularly by/ well established with podiatry provider  Hospitalization January 28-30, 2023 (OBS status) for hypoglycemia Hospitalization June 9-20, 2023 for (R) foot osteomyelitis; discharged to SNF for rehabilitation; discharge from SNF to home/ self-care 04/29/22 with wound vac/ home health services in place  [facility] Clinical Goal(s):  Patient will demonstrate Improved adherence to prescribed treatment plan for HLD and DMII as evidenced by patient reporting of adherence to blood sugar monitoring at home; taking medications as prescribed, learning about dietary strategies for self-health management in HLD/ DMII  through collaboration with RN Care manager, provider, and care team.   Interventions: 1:1 collaboration with primary care provider regarding development and update of comprehensive plan of care as evidenced by provider attestation and co-signature Inter-disciplinary care team collaboration (see longitudinal plan of care) Evaluation of current treatment plan related to  self management and patient's adherence to plan as established by provider Review of patient status, including review of consultants reports, relevant laboratory and other test results, and medications completed SDOH updated: no new/ unmet concerns identified Pain assessment updated: reports ongoing chronic pain to (R) foot; reports current interventions "help somewhat;" confirms she is well-established with podiatry provider for management of wound/ pain Falls assessment updated: continues to report several falls x last 12 months; states she had fall without injury on 04/30/22 at home after SNF discharge; states she tripped over  "something" and fell; denies significant injury, states was able to get herself up without assistance; encouragement provided to continue efforts at fall prevention; previously provided education around fall risks/ prevention reinforced Reviewed recent hospitalization/ SNF rehabilitation visits with patient Confirms today that home health services are in place Confirms she has communicated with [employer] care guide for food resources that are available to her thorough her insurance program; she denies true food insecurity, but is appreciative of this benefit  Confirmed no DME needs post-hospital/ SNF discharge: she has and uses walker, cane, and wheelchair Reviewed discharge instructions; patient verbalizes good understanding of same and denies questions Medications discussed: reports continues to independently self-manage and denies current concerns/ issues/ questions around medications; endorses adherence to taking all medications as prescribed Confirms she has taken all antibiotics post- recent hospitalization as instructed  Reviewed upcoming scheduled provider appointments: 05/09/22- PCP; 05/16/22- ID provider; 05/18/22- podiatry provider; patient confirms is aware of all and has plans to attend as scheduled-- family is providing transportation post- recent hospitalization; verbalizes plans to  resume using insurance benefit once her wound gets "better" and she no longer has wound vac in place Discussed plans with patient for ongoing care management follow up- patient denies current care coordination/ care management needs and requests CCM RN CM case closure today; she verbalizes "overwhelmed with so many people" contacting her and she does not "want to start all over with someone new;" I strongly encouraged her to continue with care management outreach, however, she declines ongoing participation and verbalizes understanding to contact PCP or other care providers for any needs that arise in the  future; confirms he has contact information for all care providers; made patient aware that if care coordination/ care management needs arise in the future to make her PCP aware so that another referral may be placed   Hyperlipidemia Interventions:  05/04/22: patient declines ongoing participation with goal;  Long-Term Goal  Confirmed patient continues taking prescribed medication for HLD- encouraged her ongoing adherence to prescribed diet  Diabetes/ (R) non-healing foot wound Interventions:  05/04/22: patient declines ongoing participation:  Long-Term Goal  Provided education to patient about basic DM disease process Counseled on importance of regular laboratory monitoring as prescribed Confirmed patient continues taking 70/30 insulin tid with meals: 30U- 30U- 40U Confirmed no signs/ symptoms hypoglycemia; reinforced previously provided education around same with corresponding action plan, patient verbalizes good general understanding of same Patient tells me that she has not been checking her blood sugars over weekend since SNF discharge home- states she needs to apply CGM into arm, and "just hasn't had time;" patient has had ongoing non-adherence to monitoring blood sugars at home routinely/ regularly:  I again encouraged her to begin monitoring sugars at home regularly and again stressed that the best way to decrease the chance of complications is to begin monitoring blood sugars at home so she knows where her sugar levels are; she tells me she will apply sensor for CGM "soon" and this was encouraged Assessed patient's understanding of meaning/ significance of A1-C values: good baseline understanding of same- Reviewed individual historical A1-C trends and provided education around correlation of A1-C value to blood sugar levels at home over 3 months Reinforced previously provided education around risk of complications in setting of ongoing high blood sugar levels and discussed basic pathophysiology  of her recent osteomyelitis and how this is a serious complication of ongoing high blood sugar levels Encouraged patient to schedule post-hospital office visit with endocrinology provider- she states she will do  Lab Results  Component Value Date   HGBA1C 7.5 (H) 04/10/2022      Plan: No further follow up required: Discussed plans with patient for ongoing care management follow up;  patient denies current care coordination/ care management needs and expresses that she is overwhelmed with multiple provider calls/ outreaches/ appointments; states "I don't want to start over with someone I don't even know;" provided encouragement to make PCP aware of any needs that arise in the future; PCP made aware of CCM RN CM case closure today  Oneta Rack, RN, BSN, [place] [phone]: direct office     Medical screening examination/treatment/procedure(s) were performed by non-physician practitioner and as supervising physician I was immediately available for consultation/collaboration.  I agree with above. Lew Dawes, MD

## 2022-05-04 NOTE — Progress Notes (Signed)
Subjective:  Patient ID: Katherine Herrera, female    DOB: April 28, 1966,  MRN: [id number]  Chief Complaint  Patient presents with   Diabetic Ulcer    DOS: 04/13/2022 Procedure: Right foot revisional I&D washout with Integra application/VAC  56 y.o. female returns for post-op check.  Patient states that she is doing okay.  The VAC is functioning well.  The graft is intact she is she is here to get her wound evaluated.  She is having nursing changing her dressing 3 times a week.  Review of Systems: Negative except as noted in the HPI. Denies N/V/F/Ch.  Past Medical History:  Diagnosis Date   Anxiety    Arthritis    hands   B12 deficiency    Chronic constipation    Chronic pain syndrome    neck, lower back, and foot   COPD (chronic obstructive pulmonary disease) ([place])    Diabetic ulcer of right foot ([place])    Gout    possible   History of seizure    05-26-2020  per pt as child and last one in late teen's   History of sepsis    due to lower extremity cellulits 2019 left , right 03/ 2021   History of syncope    Hyperlipidemia    Hypothyroidism    Impaired range of motion of cervical spine    per pt hx cervical fusion C4 -- 7,  has to be careful about turning her head to the right can cause her to pass out   Insulin dependent type 2 diabetes mellitus [facility])    endocrinologist--- dr Chalmers Cater    (05-26-2020 per pt check's blood sugar twice dialy and at times a third time,  fasting am blood sugar's---- 200 -- 250)   LBP (low back pain)    DR Patrice Paradise   Migraine    Peripheral neuropathy    RBBB (right bundle branch block)    Tenosynovitis of foot and ankle 09/03/2013   TTS (tarsal tunnel syndrome) 2010   Left Foot [facility] in [place]   Vitamin D deficiency    Wears glasses     Current Outpatient Medications:    amoxicillin-clavulanate (AUGMENTIN) 875-125 MG tablet, Take 1 tablet by mouth every 12 (twelve) hours for 16 days., Disp: , Rfl:    B-D INS SYR ULTRAFINE 1CC/31G 31G X 5/16" 1 ML MISC,  USE AS DIRECTED, Disp: 100 each, Rfl: 0   bisacodyl (DULCOLAX) 5 MG EC tablet, Take 10 mg by mouth at bedtime., Disp: , Rfl:    Blood Glucose Monitoring Suppl (ONETOUCH VERIO) w/Device KIT, , Disp: , Rfl:    cholecalciferol (VITAMIN D3) 25 MCG (1000 UNIT) tablet, Take 1,000 Units by mouth daily., Disp: , Rfl:    clonazePAM (KLONOPIN) 0.5 MG tablet, Take 1 tablet (0.5 mg total) by mouth 2 (two) times daily as needed for up to 5 days for anxiety., Disp: 10 tablet, Rfl: 0   colchicine 0.6 MG tablet, TAKE 2 TABLETS AS NEEDED GOUT ATTACK. THEN TAKE ANOTHER 1 TAB IN 1-2 HOURS. DONT REPEAT FOR 3 DAYS. (Patient taking differently: Take 0.6 mg by mouth daily as needed (gout).), Disp: 18 tablet, Rfl: 1   docusate sodium (COLACE) 100 MG capsule, Take 100 mg by mouth at bedtime., Disp: , Rfl:    ezetimibe (ZETIA) 10 MG tablet, TAKE 1 TABLET BY MOUTH  DAILY (Patient taking differently: Take 10 mg by mouth daily.), Disp: 90 tablet, Rfl: 3   FLUoxetine (PROZAC) 10 MG capsule, Take 1 capsule (  10 mg total) by mouth daily., Disp: 90 capsule, Rfl: 3   gabapentin (NEURONTIN) 800 MG tablet, TAKE 1 TABLET BY MOUTH 4  TIMES DAILY (Patient taking differently: Take 800 mg by mouth 3 (three) times daily.), Disp: 360 tablet, Rfl: 3   insulin NPH-regular Human (70-30) 100 UNIT/ML injection, Inject 20 Units into the skin 2 (two) times daily with a meal. (Patient taking differently: Inject 30-40 Units into the skin See admin instructions. 30 units in the morning and afternoon and 40 units at night), Disp: 10 mL, Rfl: 11   lamoTRIgine (LAMICTAL) 25 MG tablet, TAKE 1 TABLET BY MOUTH  TWICE DAILY (Patient taking differently: Take 25 mg by mouth 2 (two) times daily.), Disp: 180 tablet, Rfl: 3   Lancet Devices (ACCU-CHEK SOFTCLIX) lancets, Test two times daily, Disp: 100 each, Rfl: 12   Lancets (ONETOUCH DELICA PLUS [id number]) MISC, , Disp: , Rfl:    levothyroxine (SYNTHROID) 75 MCG tablet, TAKE 1 TABLET BY MOUTH  DAILY (Patient taking  differently: Take 75 mcg by mouth daily before breakfast.), Disp: 90 tablet, Rfl: 3   lisinopril (ZESTRIL) 10 MG tablet, Take 10 mg by mouth daily., Disp: , Rfl:    loratadine (CLARITIN) 10 MG tablet, Take 1 tablet (10 mg total) by mouth daily as needed for allergies., Disp: 100 tablet, Rfl: 3   ondansetron (ZOFRAN) 4 MG tablet, Take 4 mg by mouth every 8 (eight) hours as needed for nausea or vomiting., Disp: , Rfl:    rOPINIRole (REQUIP) 0.5 MG tablet, TAKE 1 TABLET BY MOUTH AT  BEDTIME (Patient taking differently: Take 0.5 mg by mouth daily.), Disp: 90 tablet, Rfl: 3   silver sulfADIAZINE (SILVADENE) 1 % cream, APPLY PEA-SIZED AMOUNT TO WOUND DAILY. (Patient not taking: Reported on 04/08/2022), Disp: 50 g, Rfl: 0   vitamin B-12 (CYANOCOBALAMIN) 1000 MCG tablet, Take 1,000 mcg by mouth daily., Disp: , Rfl:   Social History   Tobacco Use  Smoking Status Every Day   Packs/day: 1.00   Years: 10.00   Total pack years: 10.00   Types: Cigarettes  Smokeless Tobacco Never  Tobacco Comments   Trying to quit, smoking 1 ppd    Allergies  Allergen Reactions   Cephalexin Nausea And Vomiting   Demerol  [Meperidine Hcl] Rash   Lovastatin Other (See Comments)    Possible myalgia    Metformin And Related Nausea And Vomiting   Sulfamethoxazole-Trimethoprim     Other reaction(s): Unknown DILI, pancreatitis   Crestor [Rosuvastatin Calcium]     myalgia   Hydromorphone     Other reaction(s): Unknown   Niacin Other (See Comments)    Unknown    Other Other (See Comments)   Hydromorphone Hcl Itching    Patient has been tolerating Hydromorphone tablets without adverse effect (07/09/19) Other reaction(s): Unknown   Paroxetine Other (See Comments)    Unknown    Objective:  There were no vitals filed for this visit. There is no height or weight on file to calculate BMI. Constitutional Well developed. Well nourished.  Vascular Foot warm and well perfused. Capillary refill normal to all digits.    Neurologic Normal speech. Oriented to person, place, and time. Epicritic sensation to light touch grossly present bilaterally.  Dermatologic Skin healing well without signs of infection. Skin edges well coapted without signs of infection.  Orthopedic: Tenderness to palpation noted about the surgical site.   Radiographs: None Assessment:   1. Diabetic ulcer of right midfoot associated with type 2 diabetes mellitus,  with fat layer exposed ([place])    Plan:  Patient was evaluated and treated and all questions answered.  S/p foot surgery right -Progressing as expected post-operatively. -XR: See above -WB Status: Partial weightbearing to the heel/nonweightbearing in walker -Sutures/graft: Intact.  20% take of the graft noted thus far.  Silicone layer is within normal limits the graft appears to be still incorporating. -Medications: None -Foot redressed with Betadine wet-to-dry dressing.  Patient will reapply the [facility] when she gets back to the office  No follow-ups on file.

## 2022-05-05 ENCOUNTER — Other Ambulatory Visit: Payer: Self-pay | Admitting: Internal Medicine

## 2022-05-05 DIAGNOSIS — M545 Low back pain, unspecified: Secondary | ICD-10-CM | POA: Diagnosis not present

## 2022-05-05 DIAGNOSIS — M19042 Primary osteoarthritis, left hand: Secondary | ICD-10-CM | POA: Diagnosis not present

## 2022-05-05 DIAGNOSIS — E1169 Type 2 diabetes mellitus with other specified complication: Secondary | ICD-10-CM | POA: Diagnosis not present

## 2022-05-05 DIAGNOSIS — B957 Other staphylococcus as the cause of diseases classified elsewhere: Secondary | ICD-10-CM | POA: Diagnosis not present

## 2022-05-05 DIAGNOSIS — E559 Vitamin D deficiency, unspecified: Secondary | ICD-10-CM | POA: Diagnosis not present

## 2022-05-05 DIAGNOSIS — I10 Essential (primary) hypertension: Secondary | ICD-10-CM | POA: Diagnosis not present

## 2022-05-05 DIAGNOSIS — N179 Acute kidney failure, unspecified: Secondary | ICD-10-CM | POA: Diagnosis not present

## 2022-05-05 DIAGNOSIS — L97518 Non-pressure chronic ulcer of other part of right foot with other specified severity: Secondary | ICD-10-CM | POA: Diagnosis not present

## 2022-05-05 DIAGNOSIS — E1142 Type 2 diabetes mellitus with diabetic polyneuropathy: Secondary | ICD-10-CM | POA: Diagnosis not present

## 2022-05-05 DIAGNOSIS — E7800 Pure hypercholesterolemia, unspecified: Secondary | ICD-10-CM | POA: Diagnosis not present

## 2022-05-05 DIAGNOSIS — M86671 Other chronic osteomyelitis, right ankle and foot: Secondary | ICD-10-CM | POA: Diagnosis not present

## 2022-05-05 DIAGNOSIS — M19041 Primary osteoarthritis, right hand: Secondary | ICD-10-CM | POA: Diagnosis not present

## 2022-05-05 DIAGNOSIS — G43909 Migraine, unspecified, not intractable, without status migrainosus: Secondary | ICD-10-CM | POA: Diagnosis not present

## 2022-05-05 DIAGNOSIS — E039 Hypothyroidism, unspecified: Secondary | ICD-10-CM | POA: Diagnosis not present

## 2022-05-05 DIAGNOSIS — F17210 Nicotine dependence, cigarettes, uncomplicated: Secondary | ICD-10-CM | POA: Diagnosis not present

## 2022-05-05 DIAGNOSIS — Z981 Arthrodesis status: Secondary | ICD-10-CM | POA: Diagnosis not present

## 2022-05-05 DIAGNOSIS — E538 Deficiency of other specified B group vitamins: Secondary | ICD-10-CM | POA: Diagnosis not present

## 2022-05-05 DIAGNOSIS — Z9181 History of falling: Secondary | ICD-10-CM | POA: Diagnosis not present

## 2022-05-05 DIAGNOSIS — J449 Chronic obstructive pulmonary disease, unspecified: Secondary | ICD-10-CM | POA: Diagnosis not present

## 2022-05-05 DIAGNOSIS — E11621 Type 2 diabetes mellitus with foot ulcer: Secondary | ICD-10-CM | POA: Diagnosis not present

## 2022-05-05 DIAGNOSIS — G894 Chronic pain syndrome: Secondary | ICD-10-CM | POA: Diagnosis not present

## 2022-05-05 DIAGNOSIS — M109 Gout, unspecified: Secondary | ICD-10-CM | POA: Diagnosis not present

## 2022-05-05 DIAGNOSIS — Z794 Long term (current) use of insulin: Secondary | ICD-10-CM | POA: Diagnosis not present

## 2022-05-05 DIAGNOSIS — K5909 Other constipation: Secondary | ICD-10-CM | POA: Diagnosis not present

## 2022-05-06 ENCOUNTER — Telehealth: Payer: Self-pay | Admitting: Internal Medicine

## 2022-05-06 DIAGNOSIS — E559 Vitamin D deficiency, unspecified: Secondary | ICD-10-CM | POA: Diagnosis not present

## 2022-05-06 DIAGNOSIS — E039 Hypothyroidism, unspecified: Secondary | ICD-10-CM | POA: Diagnosis not present

## 2022-05-06 DIAGNOSIS — E1169 Type 2 diabetes mellitus with other specified complication: Secondary | ICD-10-CM | POA: Diagnosis not present

## 2022-05-06 DIAGNOSIS — J449 Chronic obstructive pulmonary disease, unspecified: Secondary | ICD-10-CM | POA: Diagnosis not present

## 2022-05-06 DIAGNOSIS — M19042 Primary osteoarthritis, left hand: Secondary | ICD-10-CM | POA: Diagnosis not present

## 2022-05-06 DIAGNOSIS — M86671 Other chronic osteomyelitis, right ankle and foot: Secondary | ICD-10-CM | POA: Diagnosis not present

## 2022-05-06 DIAGNOSIS — E538 Deficiency of other specified B group vitamins: Secondary | ICD-10-CM | POA: Diagnosis not present

## 2022-05-06 DIAGNOSIS — K5909 Other constipation: Secondary | ICD-10-CM | POA: Diagnosis not present

## 2022-05-06 DIAGNOSIS — M545 Low back pain, unspecified: Secondary | ICD-10-CM | POA: Diagnosis not present

## 2022-05-06 DIAGNOSIS — E7800 Pure hypercholesterolemia, unspecified: Secondary | ICD-10-CM | POA: Diagnosis not present

## 2022-05-06 DIAGNOSIS — M19041 Primary osteoarthritis, right hand: Secondary | ICD-10-CM | POA: Diagnosis not present

## 2022-05-06 DIAGNOSIS — M109 Gout, unspecified: Secondary | ICD-10-CM | POA: Diagnosis not present

## 2022-05-06 DIAGNOSIS — L97518 Non-pressure chronic ulcer of other part of right foot with other specified severity: Secondary | ICD-10-CM | POA: Diagnosis not present

## 2022-05-06 DIAGNOSIS — N179 Acute kidney failure, unspecified: Secondary | ICD-10-CM | POA: Diagnosis not present

## 2022-05-06 DIAGNOSIS — G43909 Migraine, unspecified, not intractable, without status migrainosus: Secondary | ICD-10-CM | POA: Diagnosis not present

## 2022-05-06 DIAGNOSIS — B957 Other staphylococcus as the cause of diseases classified elsewhere: Secondary | ICD-10-CM | POA: Diagnosis not present

## 2022-05-06 DIAGNOSIS — Z981 Arthrodesis status: Secondary | ICD-10-CM | POA: Diagnosis not present

## 2022-05-06 DIAGNOSIS — F17210 Nicotine dependence, cigarettes, uncomplicated: Secondary | ICD-10-CM | POA: Diagnosis not present

## 2022-05-06 DIAGNOSIS — I10 Essential (primary) hypertension: Secondary | ICD-10-CM | POA: Diagnosis not present

## 2022-05-06 DIAGNOSIS — G894 Chronic pain syndrome: Secondary | ICD-10-CM | POA: Diagnosis not present

## 2022-05-06 DIAGNOSIS — E1142 Type 2 diabetes mellitus with diabetic polyneuropathy: Secondary | ICD-10-CM | POA: Diagnosis not present

## 2022-05-06 DIAGNOSIS — Z794 Long term (current) use of insulin: Secondary | ICD-10-CM | POA: Diagnosis not present

## 2022-05-06 DIAGNOSIS — Z9181 History of falling: Secondary | ICD-10-CM | POA: Diagnosis not present

## 2022-05-06 DIAGNOSIS — E11621 Type 2 diabetes mellitus with foot ulcer: Secondary | ICD-10-CM | POA: Diagnosis not present

## 2022-05-06 NOTE — Telephone Encounter (Signed)
Katherine Herrera from [facility] needing verbal orders for PT for 2x wk for 2 wks and 1x per wk for 2wks  [phone]- contact Katherine Herrera with verbal approvals

## 2022-05-08 DIAGNOSIS — E1165 Type 2 diabetes mellitus with hyperglycemia: Secondary | ICD-10-CM | POA: Diagnosis not present

## 2022-05-09 ENCOUNTER — Telehealth: Payer: Self-pay | Admitting: *Deleted

## 2022-05-09 ENCOUNTER — Encounter: Payer: Self-pay | Admitting: Internal Medicine

## 2022-05-09 ENCOUNTER — Ambulatory Visit (INDEPENDENT_AMBULATORY_CARE_PROVIDER_SITE_OTHER): Payer: Medicare Other | Admitting: Internal Medicine

## 2022-05-09 DIAGNOSIS — M19041 Primary osteoarthritis, right hand: Secondary | ICD-10-CM | POA: Diagnosis not present

## 2022-05-09 DIAGNOSIS — E11621 Type 2 diabetes mellitus with foot ulcer: Secondary | ICD-10-CM | POA: Diagnosis not present

## 2022-05-09 DIAGNOSIS — E11610 Type 2 diabetes mellitus with diabetic neuropathic arthropathy: Secondary | ICD-10-CM

## 2022-05-09 DIAGNOSIS — M19042 Primary osteoarthritis, left hand: Secondary | ICD-10-CM | POA: Diagnosis not present

## 2022-05-09 DIAGNOSIS — E1140 Type 2 diabetes mellitus with diabetic neuropathy, unspecified: Secondary | ICD-10-CM | POA: Diagnosis not present

## 2022-05-09 DIAGNOSIS — M5440 Lumbago with sciatica, unspecified side: Secondary | ICD-10-CM

## 2022-05-09 DIAGNOSIS — G894 Chronic pain syndrome: Secondary | ICD-10-CM | POA: Diagnosis not present

## 2022-05-09 DIAGNOSIS — N179 Acute kidney failure, unspecified: Secondary | ICD-10-CM | POA: Diagnosis not present

## 2022-05-09 DIAGNOSIS — F419 Anxiety disorder, unspecified: Secondary | ICD-10-CM

## 2022-05-09 DIAGNOSIS — F17210 Nicotine dependence, cigarettes, uncomplicated: Secondary | ICD-10-CM | POA: Diagnosis not present

## 2022-05-09 DIAGNOSIS — E7800 Pure hypercholesterolemia, unspecified: Secondary | ICD-10-CM | POA: Diagnosis not present

## 2022-05-09 DIAGNOSIS — I10 Essential (primary) hypertension: Secondary | ICD-10-CM | POA: Diagnosis not present

## 2022-05-09 DIAGNOSIS — E1142 Type 2 diabetes mellitus with diabetic polyneuropathy: Secondary | ICD-10-CM

## 2022-05-09 DIAGNOSIS — L97518 Non-pressure chronic ulcer of other part of right foot with other specified severity: Secondary | ICD-10-CM | POA: Diagnosis not present

## 2022-05-09 DIAGNOSIS — M109 Gout, unspecified: Secondary | ICD-10-CM | POA: Diagnosis not present

## 2022-05-09 DIAGNOSIS — E039 Hypothyroidism, unspecified: Secondary | ICD-10-CM

## 2022-05-09 DIAGNOSIS — M545 Low back pain, unspecified: Secondary | ICD-10-CM | POA: Diagnosis not present

## 2022-05-09 DIAGNOSIS — E538 Deficiency of other specified B group vitamins: Secondary | ICD-10-CM | POA: Diagnosis not present

## 2022-05-09 DIAGNOSIS — M86671 Other chronic osteomyelitis, right ankle and foot: Secondary | ICD-10-CM | POA: Diagnosis not present

## 2022-05-09 DIAGNOSIS — Z9181 History of falling: Secondary | ICD-10-CM | POA: Diagnosis not present

## 2022-05-09 DIAGNOSIS — B957 Other staphylococcus as the cause of diseases classified elsewhere: Secondary | ICD-10-CM | POA: Diagnosis not present

## 2022-05-09 DIAGNOSIS — K5909 Other constipation: Secondary | ICD-10-CM | POA: Diagnosis not present

## 2022-05-09 DIAGNOSIS — E559 Vitamin D deficiency, unspecified: Secondary | ICD-10-CM | POA: Diagnosis not present

## 2022-05-09 DIAGNOSIS — J449 Chronic obstructive pulmonary disease, unspecified: Secondary | ICD-10-CM | POA: Diagnosis not present

## 2022-05-09 DIAGNOSIS — G43909 Migraine, unspecified, not intractable, without status migrainosus: Secondary | ICD-10-CM | POA: Diagnosis not present

## 2022-05-09 DIAGNOSIS — E1169 Type 2 diabetes mellitus with other specified complication: Secondary | ICD-10-CM | POA: Diagnosis not present

## 2022-05-09 DIAGNOSIS — Z794 Long term (current) use of insulin: Secondary | ICD-10-CM | POA: Diagnosis not present

## 2022-05-09 DIAGNOSIS — Z981 Arthrodesis status: Secondary | ICD-10-CM | POA: Diagnosis not present

## 2022-05-09 MED ORDER — OXYCODONE HCL 15 MG PO TABS: 15.0000 mg | ORAL_TABLET | Freq: Four times a day (QID) | ORAL | 0 refills | Status: DC | PRN

## 2022-05-09 MED ORDER — CLONAZEPAM 0.5 MG PO TABS
0.5000 mg | ORAL_TABLET | Freq: Two times a day (BID) | ORAL | 1 refills | Status: DC | PRN
Start: 2022-05-09 — End: 2023-01-25

## 2022-05-09 NOTE — Telephone Encounter (Signed)
Per Estill Bamberg, "[facility] Nurse, stated the patient smelled odor all weekend, greenish drainage in the canister. When she removed the sponge, half of her staples were not in tact. She wanted to know if the wound vac needs to go back on since it might not be doing what it is supposed to be doing." [phone]   Per Dr. Sherryle Lis, "I would recommend leaving the wound VAC off for now. wet to dry saline dressings until she sees him, daily."  I called and gave Otila Kluver Dr. Maxie Barb recommendation.

## 2022-05-09 NOTE — Assessment & Plan Note (Signed)
On B12 

## 2022-05-09 NOTE — Progress Notes (Signed)
Subjective:  Patient ID: Katherine Herrera, female    DOB: February 11, 1966  Age: 56 y.o. MRN: [id number]  CC: No chief complaint on file.   HPI Katherine Herrera presents for diabetic foot, neuropathy, chronic pain, anxiety  Per hx: " Admit date: 04/08/2022 Discharge date: 04/19/2022   Admitted From: Home Discharge disposition: SNF   Recommendations at discharge:  Continue wound VAC changes Monday Wednesday Friday per podiatry. Continue oral antibiotic (Augmentin) till 05/04/2022 with probiotics   Brief narrative: Katherine Herrera is a 56 y.o. female with PMH significant for gout, HTN, HLD, history of childhood seizures, chronic pain syndrome, chronic osteomyelitis of the right foot who was sent to the ED from wound care center for IV antibiotics due to worsening right foot wound, concern for worsening osteomyelitis. Patient initially placed on IV antibiotics.   Admitted to [facility].   Podiatry consulted.  See below for details   Subjective: Patient was seen and examined this morning.  Not in distress.  No new symptoms.       Principal Problem:   Osteomyelitis of foot ([place]) Active Problems:   AKI (acute kidney injury) ([place])   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 ([place])   Essential hypertension   Hypothyroidism   Pure hypercholesterolemia   Anxiety disorder   Chronic pain     Hospital course: Right foot osteomyelitis -6/11, patient underwent excisional debridement of the wound  -2/68, underwent application of Integra and wound VAC. -Per podiatry, patient will need wound VAC changes Monday Wednesday Friday. -Wound culture grew Staphylococcus simulans. -Initially improved with IV antibiotics.  Per ID recommendation, patient to continue oral Augmentin plan till 05/04/22   Type 2 diabetes mellitus Peripheral neuropathy -A1c 7.5 on 04/10/2022 -Home meds include 70/30 insulin 3 times a day (30, 30 and 40 units) -While in the hospital, patient was given less insulin because of  perioperative poor appetite.  Her appetite improved.  Blood sugar level started to run elevated. -At discharge, I would resume her home regimen with insulin 70/30 3 times a day.   Last Labs          Recent Labs  Lab 04/18/22 1128 04/18/22 1642 04/18/22 2115 04/19/22 0357 04/19/22 0753  GLUCAP 231* 316* 438* 213* 159*      AKI (acute kidney injury)  -Renal function improved with IV fluid Recent Labs (within last 365 days)              Recent Labs    03/16/22 1057 04/08/22 1220 04/09/22 0052 04/10/22 0132 04/11/22 0657 04/12/22 0037 04/13/22 0012 04/13/22 2238 04/14/22 0911 04/15/22 0243  BUN _0 27* 23* 17  CREATININE 1.18* 1.24* 1.08* 1.09* 0.99 1.01* 1.08* 1.56* 1.03* 0.96      Essential hypertension -Blood pressure currently stable.   -Lisinopril was held due to AKI.  At discharge, I would resume lisinopril because of coexisting diabetes mellitus. -Continue to monitor blood pressure.   Hypothyroidism -TSH normal in 08/2021 -Continue home synthroid    Pure hypercholesterolemia -Continue zetia. Statin intolerance.  -Recent LDL of 135    Anxiety disorder -Continue prozac and klonopin    Chronic pain -Continue home oxycodone.    COPD (chronic obstructive pulmonary disease) -Respiratory status stable."    Outpatient Medications Prior to Visit  Medication Sig Dispense Refill   B-D INS SYR ULTRAFINE 1CC/31G 31G X 5/16" 1 ML MISC USE AS DIRECTED 100 each 0   bisacodyl (DULCOLAX) 5 MG EC tablet  Take 10 mg by mouth at bedtime.     Blood Glucose Monitoring Suppl (ONETOUCH VERIO) w/Device KIT      cholecalciferol (VITAMIN D3) 25 MCG (1000 UNIT) tablet Take 1,000 Units by mouth daily.     colchicine 0.6 MG tablet TAKE 2 TABLETS AS NEEDED GOUT ATTACK. THEN TAKE ANOTHER 1 TAB IN 1-2 HOURS. DONT REPEAT FOR 3 DAYS. (Patient taking differently: Take 0.6 mg by mouth daily as needed (gout).) 18 tablet 1   docusate sodium (COLACE) 100 MG capsule Take 100  mg by mouth at bedtime.     ezetimibe (ZETIA) 10 MG tablet TAKE 1 TABLET BY MOUTH DAILY 100 tablet 2   FLUoxetine (PROZAC) 10 MG capsule Take 1 capsule (10 mg total) by mouth daily. 90 capsule 3   gabapentin (NEURONTIN) 800 MG tablet TAKE 1 TABLET BY MOUTH 4  TIMES DAILY (Patient taking differently: Take 800 mg by mouth 3 (three) times daily.) 360 tablet 3   insulin NPH-regular Human (70-30) 100 UNIT/ML injection Inject 20 Units into the skin 2 (two) times daily with a meal. (Patient taking differently: Inject 30-40 Units into the skin See admin instructions. 30 units in the morning and afternoon and 40 units at night) 10 mL 11   lamoTRIgine (LAMICTAL) 25 MG tablet TAKE 1 TABLET BY MOUTH  TWICE DAILY (Patient taking differently: Take 25 mg by mouth 2 (two) times daily.) 180 tablet 3   Lancet Devices (ACCU-CHEK SOFTCLIX) lancets Test two times daily 100 each 12   Lancets (ONETOUCH DELICA PLUS [id number]) MISC      levothyroxine (SYNTHROID) 75 MCG tablet TAKE 1 TABLET BY MOUTH  DAILY (Patient taking differently: Take 75 mcg by mouth daily before breakfast.) 90 tablet 3   lisinopril (ZESTRIL) 10 MG tablet TAKE 1 TABLET BY MOUTH DAILY 100 tablet 2   loratadine (CLARITIN) 10 MG tablet Take 1 tablet (10 mg total) by mouth daily as needed for allergies. 100 tablet 3   ondansetron (ZOFRAN) 4 MG tablet Take 4 mg by mouth every 8 (eight) hours as needed for nausea or vomiting.     rOPINIRole (REQUIP) 0.5 MG tablet TAKE 1 TABLET BY MOUTH AT  BEDTIME (Patient taking differently: Take 0.5 mg by mouth daily.) 90 tablet 3   silver sulfADIAZINE (SILVADENE) 1 % cream APPLY PEA-SIZED AMOUNT TO WOUND DAILY. 50 g 0   vitamin B-12 (CYANOCOBALAMIN) 1000 MCG tablet Take 1,000 mcg by mouth daily.     oxyCODONE (ROXICODONE) 15 MG immediate release tablet Take 15 mg by mouth every 6 (six) hours as needed.     clonazePAM (KLONOPIN) 0.5 MG tablet Take 1 tablet (0.5 mg total) by mouth 2 (two) times daily as needed for up to 5  days for anxiety. 10 tablet 0   No facility-administered medications prior to visit.    ROS: Review of Systems  Constitutional:  Negative for activity change, appetite change, chills, fatigue and unexpected weight change.  HENT:  Negative for congestion, mouth sores and sinus pressure.   Eyes:  Negative for visual disturbance.  Respiratory:  Negative for cough and chest tightness.   Gastrointestinal:  Negative for abdominal pain and nausea.  Genitourinary:  Negative for difficulty urinating, frequency and vaginal pain.  Musculoskeletal:  Positive for arthralgias and gait problem. Negative for back pain.  Skin:  Negative for pallor and rash.  Neurological:  Negative for dizziness, tremors, weakness, numbness and headaches.  Psychiatric/Behavioral:  Negative for confusion, sleep disturbance and suicidal ideas. The patient is nervous/anxious.  Objective:  BP (!) 90/50 (BP Location: Left Arm, Patient Position: Sitting, Cuff Size: Large)   Pulse 75   Temp 98.5 F (36.9 C) (Oral)   Ht 5' 9" (1.753 m)   Wt 212 lb (96.2 kg)   SpO2 93%   BMI 31.31 kg/m   BP Readings from Last 3 Encounters:  05/09/22 (!) 90/50  04/19/22 117/77  04/07/22 140/65    Wt Readings from Last 3 Encounters:  05/09/22 212 lb (96.2 kg)  04/13/22 216 lb 14.9 oz (98.4 kg)  02/23/22 219 lb (99.3 kg)    Physical Exam Constitutional:      General: She is not in acute distress.    Appearance: She is well-developed.  HENT:     Head: Normocephalic.     Right Ear: External ear normal.     Left Ear: External ear normal.     Nose: Nose normal.  Eyes:     General:        Right eye: No discharge.        Left eye: No discharge.     Conjunctiva/sclera: Conjunctivae normal.     Pupils: Pupils are equal, round, and reactive to light.  Neck:     Thyroid: No thyromegaly.     Vascular: No JVD.     Trachea: No tracheal deviation.  Cardiovascular:     Rate and Rhythm: Normal rate and regular rhythm.     Heart  sounds: Normal heart sounds.  Pulmonary:     Effort: No respiratory distress.     Breath sounds: No stridor. No wheezing.  Abdominal:     General: Bowel sounds are normal. There is no distension.     Palpations: Abdomen is soft. There is no mass.     Tenderness: There is abdominal tenderness. There is no guarding or rebound.  Musculoskeletal:        General: No tenderness.     Cervical back: Normal range of motion and neck supple. No rigidity.  Lymphadenopathy:     Cervical: No cervical adenopathy.  Skin:    Findings: No erythema or rash.  Neurological:     Mental Status: She is oriented to person, place, and time.     Cranial Nerves: No cranial nerve deficit.     Motor: No abnormal muscle tone.     Coordination: Coordination abnormal.     Gait: Gait abnormal.     Deep Tendon Reflexes: Reflexes normal.  Psychiatric:        Behavior: Behavior normal.        Thought Content: Thought content normal.        Judgment: Judgment normal.   In a w/c R foot stump is dressed    A total time of 45 minutes was spent preparing to see the patient, reviewing tests, x-rays, operative reports and other medical records.  Also, obtaining history and performing comprehensive physical exam.  Additionally, counseling the patient regarding the above listed issues.   Finally, documenting clinical information in the health records, coordination of care, educating the patient re pain, wounds, opioid Rx. It is a complex case.   Lab Results  Component Value Date   WBC 13.4 (H) 04/15/2022   HGB 14.0 04/15/2022   HCT 42.1 04/15/2022   PLT 303 04/15/2022   GLUCOSE 410 (H) 04/16/2022   CHOL 215 (H) 02/23/2022   TRIG 301.0 (H) 02/23/2022   HDL 34.70 (L) 02/23/2022   LDLDIRECT 135.0 02/23/2022   LDLCALC 81 01/02/2013   ALT 12  04/08/2022   AST 12 (L) 04/08/2022   NA 139 04/15/2022   K 4.5 04/15/2022   CL 105 04/15/2022   CREATININE 0.96 04/15/2022   BUN 17 04/15/2022   CO2 26 04/15/2022   TSH 0.99  09/09/2021   INR 1.0 07/19/2019   HGBA1C 7.5 (H) 04/10/2022   MICROALBUR 0.9 07/19/2016    DG Foot Complete Right  Result Date: 04/08/2022 CLINICAL DATA:  Right foot pain. EXAM: RIGHT FOOT COMPLETE - 3+ VIEW COMPARISON:  Right foot x-rays dated Mar 04, 2022. MRI right foot dated Mar 11, 2022. FINDINGS: Similar degree of chronic bony destruction involving the base of fifth metatarsal and cuboid. No progressive bony destruction. 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 No acute fracture or dislocation. Unchanged soft tissue wound along the lateral mid and forefoot. IMPRESSION: 1. Similar degree of chronic osteomyelitis involving the base of the fifth metatarsal and cuboid. No progressive bony destruction. Electronically Signed   By: Titus Dubin M.D.   On: 04/08/2022 14:30    Assessment & Plan:   Problem List Items Addressed This Visit     Anxiety disorder    Cont on Clonazepam prn  Potential benefits of a long term benzodiazepines  use as well as potential risks  and complications were explained to the patient and were aknowledged.      B12 deficiency    On B12      Charcot foot due to diabetes mellitus ([place])    S/p recent surgery      Relevant Medications   clonazePAM (KLONOPIN) 0.5 MG tablet   oxyCODONE (ROXICODONE) 15 MG immediate release tablet   Hypothyroidism    On Levothroid Dr Chalmers Cater      LOW BACK PAIN    Chronic pain Cont on Oxycodone 15 mg  Potential benefits of a long term opioids use as well as potential risks (i.e. addiction risk, apnea etc) and complications (i.e. Somnolence, constipation and others) were explained to the patient and were aknowledged. Risks of use w/benzodiazepines discussed  Do not take w/Clonazepam      Relevant Medications   oxyCODONE (ROXICODONE) 15 MG immediate release tablet   Polyneuropathy due to type 2 diabetes mellitus (HCC) (Chronic)    F/u w/Dr Denyce Robert are better on Libre 2      Relevant Medications    clonazePAM (KLONOPIN) 0.5 MG tablet   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)    Cont on 70/30 insulin         Meds ordered this encounter  Medications   clonazePAM (KLONOPIN) 0.5 MG tablet    Sig: Take 1 tablet (0.5 mg total) by mouth 2 (two) times daily as needed for up to 5 days for anxiety.    Dispense:  60 tablet    Refill:  1   DISCONTD: oxyCODONE (ROXICODONE) 15 MG immediate release tablet    Sig: Take 1 tablet (15 mg total) by mouth every 6 (six) hours as needed for pain.    Dispense:  120 tablet    Refill:  0   oxyCODONE (ROXICODONE) 15 MG immediate release tablet    Sig: Take 1 tablet (15 mg total) by mouth every 6 (six) hours as needed for pain.    Dispense:  120 tablet    Refill:  0    Please fill on or after 06/08/22      Follow-up: Return in about 2 months (around 07/10/2022) for a follow-up visit.  Walker Kehr, MD

## 2022-05-09 NOTE — Assessment & Plan Note (Signed)
On Levothroid Dr Chalmers Cater

## 2022-05-09 NOTE — Assessment & Plan Note (Signed)
Chronic pain Cont on Oxycodone 15 mg  Potential benefits of a long term opioids use as well as potential risks (i.e. addiction risk, apnea etc) and complications (i.e. Somnolence, constipation and others) were explained to the patient and were aknowledged. Risks of use w/benzodiazepines discussed  Do not take w/Clonazepam

## 2022-05-09 NOTE — Assessment & Plan Note (Signed)
F/u w/Dr Denyce Robert are better on Libre 2

## 2022-05-09 NOTE — Assessment & Plan Note (Signed)
S/p recent surgery

## 2022-05-09 NOTE — Assessment & Plan Note (Signed)
Cont on Clonazepam prn  Potential benefits of a long term benzodiazepines  use as well as potential risks  and complications were explained to the patient and were aknowledged.

## 2022-05-09 NOTE — Assessment & Plan Note (Signed)
Cont on 70/30 insulin

## 2022-05-10 DIAGNOSIS — M19041 Primary osteoarthritis, right hand: Secondary | ICD-10-CM | POA: Diagnosis not present

## 2022-05-10 DIAGNOSIS — E7800 Pure hypercholesterolemia, unspecified: Secondary | ICD-10-CM | POA: Diagnosis not present

## 2022-05-10 DIAGNOSIS — Z794 Long term (current) use of insulin: Secondary | ICD-10-CM | POA: Diagnosis not present

## 2022-05-10 DIAGNOSIS — E559 Vitamin D deficiency, unspecified: Secondary | ICD-10-CM | POA: Diagnosis not present

## 2022-05-10 DIAGNOSIS — E039 Hypothyroidism, unspecified: Secondary | ICD-10-CM | POA: Diagnosis not present

## 2022-05-10 DIAGNOSIS — G43909 Migraine, unspecified, not intractable, without status migrainosus: Secondary | ICD-10-CM | POA: Diagnosis not present

## 2022-05-10 DIAGNOSIS — E11621 Type 2 diabetes mellitus with foot ulcer: Secondary | ICD-10-CM | POA: Diagnosis not present

## 2022-05-10 DIAGNOSIS — E538 Deficiency of other specified B group vitamins: Secondary | ICD-10-CM | POA: Diagnosis not present

## 2022-05-10 DIAGNOSIS — E1142 Type 2 diabetes mellitus with diabetic polyneuropathy: Secondary | ICD-10-CM | POA: Diagnosis not present

## 2022-05-10 DIAGNOSIS — Z9181 History of falling: Secondary | ICD-10-CM | POA: Diagnosis not present

## 2022-05-10 DIAGNOSIS — M545 Low back pain, unspecified: Secondary | ICD-10-CM | POA: Diagnosis not present

## 2022-05-10 DIAGNOSIS — I10 Essential (primary) hypertension: Secondary | ICD-10-CM | POA: Diagnosis not present

## 2022-05-10 DIAGNOSIS — Z981 Arthrodesis status: Secondary | ICD-10-CM | POA: Diagnosis not present

## 2022-05-10 DIAGNOSIS — G894 Chronic pain syndrome: Secondary | ICD-10-CM | POA: Diagnosis not present

## 2022-05-10 DIAGNOSIS — E1169 Type 2 diabetes mellitus with other specified complication: Secondary | ICD-10-CM | POA: Diagnosis not present

## 2022-05-10 DIAGNOSIS — L97518 Non-pressure chronic ulcer of other part of right foot with other specified severity: Secondary | ICD-10-CM | POA: Diagnosis not present

## 2022-05-10 DIAGNOSIS — M86671 Other chronic osteomyelitis, right ankle and foot: Secondary | ICD-10-CM | POA: Diagnosis not present

## 2022-05-10 DIAGNOSIS — M109 Gout, unspecified: Secondary | ICD-10-CM | POA: Diagnosis not present

## 2022-05-10 DIAGNOSIS — F17210 Nicotine dependence, cigarettes, uncomplicated: Secondary | ICD-10-CM | POA: Diagnosis not present

## 2022-05-10 DIAGNOSIS — N179 Acute kidney failure, unspecified: Secondary | ICD-10-CM | POA: Diagnosis not present

## 2022-05-10 DIAGNOSIS — K5909 Other constipation: Secondary | ICD-10-CM | POA: Diagnosis not present

## 2022-05-10 DIAGNOSIS — J449 Chronic obstructive pulmonary disease, unspecified: Secondary | ICD-10-CM | POA: Diagnosis not present

## 2022-05-10 DIAGNOSIS — M19042 Primary osteoarthritis, left hand: Secondary | ICD-10-CM | POA: Diagnosis not present

## 2022-05-10 DIAGNOSIS — B957 Other staphylococcus as the cause of diseases classified elsewhere: Secondary | ICD-10-CM | POA: Diagnosis not present

## 2022-05-10 NOTE — Telephone Encounter (Signed)
Okay.  Thanks.

## 2022-05-11 ENCOUNTER — Other Ambulatory Visit: Payer: Self-pay | Admitting: Internal Medicine

## 2022-05-11 DIAGNOSIS — G43909 Migraine, unspecified, not intractable, without status migrainosus: Secondary | ICD-10-CM | POA: Diagnosis not present

## 2022-05-11 DIAGNOSIS — M19041 Primary osteoarthritis, right hand: Secondary | ICD-10-CM | POA: Diagnosis not present

## 2022-05-11 DIAGNOSIS — E1169 Type 2 diabetes mellitus with other specified complication: Secondary | ICD-10-CM | POA: Diagnosis not present

## 2022-05-11 DIAGNOSIS — Z794 Long term (current) use of insulin: Secondary | ICD-10-CM | POA: Diagnosis not present

## 2022-05-11 DIAGNOSIS — E1142 Type 2 diabetes mellitus with diabetic polyneuropathy: Secondary | ICD-10-CM | POA: Diagnosis not present

## 2022-05-11 DIAGNOSIS — E7800 Pure hypercholesterolemia, unspecified: Secondary | ICD-10-CM | POA: Diagnosis not present

## 2022-05-11 DIAGNOSIS — E039 Hypothyroidism, unspecified: Secondary | ICD-10-CM | POA: Diagnosis not present

## 2022-05-11 DIAGNOSIS — Z981 Arthrodesis status: Secondary | ICD-10-CM | POA: Diagnosis not present

## 2022-05-11 DIAGNOSIS — K5909 Other constipation: Secondary | ICD-10-CM | POA: Diagnosis not present

## 2022-05-11 DIAGNOSIS — E559 Vitamin D deficiency, unspecified: Secondary | ICD-10-CM | POA: Diagnosis not present

## 2022-05-11 DIAGNOSIS — G894 Chronic pain syndrome: Secondary | ICD-10-CM | POA: Diagnosis not present

## 2022-05-11 DIAGNOSIS — Z9181 History of falling: Secondary | ICD-10-CM | POA: Diagnosis not present

## 2022-05-11 DIAGNOSIS — B957 Other staphylococcus as the cause of diseases classified elsewhere: Secondary | ICD-10-CM | POA: Diagnosis not present

## 2022-05-11 DIAGNOSIS — N179 Acute kidney failure, unspecified: Secondary | ICD-10-CM | POA: Diagnosis not present

## 2022-05-11 DIAGNOSIS — J449 Chronic obstructive pulmonary disease, unspecified: Secondary | ICD-10-CM | POA: Diagnosis not present

## 2022-05-11 DIAGNOSIS — F17210 Nicotine dependence, cigarettes, uncomplicated: Secondary | ICD-10-CM | POA: Diagnosis not present

## 2022-05-11 DIAGNOSIS — M86671 Other chronic osteomyelitis, right ankle and foot: Secondary | ICD-10-CM | POA: Diagnosis not present

## 2022-05-11 DIAGNOSIS — I10 Essential (primary) hypertension: Secondary | ICD-10-CM | POA: Diagnosis not present

## 2022-05-11 DIAGNOSIS — L97518 Non-pressure chronic ulcer of other part of right foot with other specified severity: Secondary | ICD-10-CM | POA: Diagnosis not present

## 2022-05-11 DIAGNOSIS — M19042 Primary osteoarthritis, left hand: Secondary | ICD-10-CM | POA: Diagnosis not present

## 2022-05-11 DIAGNOSIS — M109 Gout, unspecified: Secondary | ICD-10-CM | POA: Diagnosis not present

## 2022-05-11 DIAGNOSIS — E538 Deficiency of other specified B group vitamins: Secondary | ICD-10-CM | POA: Diagnosis not present

## 2022-05-11 DIAGNOSIS — E11621 Type 2 diabetes mellitus with foot ulcer: Secondary | ICD-10-CM | POA: Diagnosis not present

## 2022-05-11 DIAGNOSIS — M545 Low back pain, unspecified: Secondary | ICD-10-CM | POA: Diagnosis not present

## 2022-05-12 DIAGNOSIS — I10 Essential (primary) hypertension: Secondary | ICD-10-CM | POA: Diagnosis not present

## 2022-05-12 DIAGNOSIS — J449 Chronic obstructive pulmonary disease, unspecified: Secondary | ICD-10-CM | POA: Diagnosis not present

## 2022-05-12 DIAGNOSIS — E1169 Type 2 diabetes mellitus with other specified complication: Secondary | ICD-10-CM | POA: Diagnosis not present

## 2022-05-12 DIAGNOSIS — M545 Low back pain, unspecified: Secondary | ICD-10-CM | POA: Diagnosis not present

## 2022-05-12 DIAGNOSIS — M86671 Other chronic osteomyelitis, right ankle and foot: Secondary | ICD-10-CM | POA: Diagnosis not present

## 2022-05-12 DIAGNOSIS — N179 Acute kidney failure, unspecified: Secondary | ICD-10-CM | POA: Diagnosis not present

## 2022-05-12 DIAGNOSIS — F17210 Nicotine dependence, cigarettes, uncomplicated: Secondary | ICD-10-CM | POA: Diagnosis not present

## 2022-05-12 DIAGNOSIS — Z981 Arthrodesis status: Secondary | ICD-10-CM | POA: Diagnosis not present

## 2022-05-12 DIAGNOSIS — G43909 Migraine, unspecified, not intractable, without status migrainosus: Secondary | ICD-10-CM | POA: Diagnosis not present

## 2022-05-12 DIAGNOSIS — E1142 Type 2 diabetes mellitus with diabetic polyneuropathy: Secondary | ICD-10-CM | POA: Diagnosis not present

## 2022-05-12 DIAGNOSIS — L97518 Non-pressure chronic ulcer of other part of right foot with other specified severity: Secondary | ICD-10-CM | POA: Diagnosis not present

## 2022-05-12 DIAGNOSIS — K5909 Other constipation: Secondary | ICD-10-CM | POA: Diagnosis not present

## 2022-05-12 DIAGNOSIS — M109 Gout, unspecified: Secondary | ICD-10-CM | POA: Diagnosis not present

## 2022-05-12 DIAGNOSIS — M19041 Primary osteoarthritis, right hand: Secondary | ICD-10-CM | POA: Diagnosis not present

## 2022-05-12 DIAGNOSIS — E538 Deficiency of other specified B group vitamins: Secondary | ICD-10-CM | POA: Diagnosis not present

## 2022-05-12 DIAGNOSIS — M19042 Primary osteoarthritis, left hand: Secondary | ICD-10-CM | POA: Diagnosis not present

## 2022-05-12 DIAGNOSIS — Z794 Long term (current) use of insulin: Secondary | ICD-10-CM | POA: Diagnosis not present

## 2022-05-12 DIAGNOSIS — E7800 Pure hypercholesterolemia, unspecified: Secondary | ICD-10-CM | POA: Diagnosis not present

## 2022-05-12 DIAGNOSIS — E039 Hypothyroidism, unspecified: Secondary | ICD-10-CM | POA: Diagnosis not present

## 2022-05-12 DIAGNOSIS — G894 Chronic pain syndrome: Secondary | ICD-10-CM | POA: Diagnosis not present

## 2022-05-12 DIAGNOSIS — E11621 Type 2 diabetes mellitus with foot ulcer: Secondary | ICD-10-CM | POA: Diagnosis not present

## 2022-05-12 DIAGNOSIS — E559 Vitamin D deficiency, unspecified: Secondary | ICD-10-CM | POA: Diagnosis not present

## 2022-05-12 DIAGNOSIS — B957 Other staphylococcus as the cause of diseases classified elsewhere: Secondary | ICD-10-CM | POA: Diagnosis not present

## 2022-05-12 DIAGNOSIS — Z9181 History of falling: Secondary | ICD-10-CM | POA: Diagnosis not present

## 2022-05-14 DIAGNOSIS — E7800 Pure hypercholesterolemia, unspecified: Secondary | ICD-10-CM | POA: Diagnosis not present

## 2022-05-14 DIAGNOSIS — E1169 Type 2 diabetes mellitus with other specified complication: Secondary | ICD-10-CM | POA: Diagnosis not present

## 2022-05-14 DIAGNOSIS — F17210 Nicotine dependence, cigarettes, uncomplicated: Secondary | ICD-10-CM | POA: Diagnosis not present

## 2022-05-14 DIAGNOSIS — Z794 Long term (current) use of insulin: Secondary | ICD-10-CM | POA: Diagnosis not present

## 2022-05-14 DIAGNOSIS — E1142 Type 2 diabetes mellitus with diabetic polyneuropathy: Secondary | ICD-10-CM | POA: Diagnosis not present

## 2022-05-14 DIAGNOSIS — M19042 Primary osteoarthritis, left hand: Secondary | ICD-10-CM | POA: Diagnosis not present

## 2022-05-14 DIAGNOSIS — E538 Deficiency of other specified B group vitamins: Secondary | ICD-10-CM | POA: Diagnosis not present

## 2022-05-14 DIAGNOSIS — J449 Chronic obstructive pulmonary disease, unspecified: Secondary | ICD-10-CM | POA: Diagnosis not present

## 2022-05-14 DIAGNOSIS — G43909 Migraine, unspecified, not intractable, without status migrainosus: Secondary | ICD-10-CM | POA: Diagnosis not present

## 2022-05-14 DIAGNOSIS — I10 Essential (primary) hypertension: Secondary | ICD-10-CM | POA: Diagnosis not present

## 2022-05-14 DIAGNOSIS — E559 Vitamin D deficiency, unspecified: Secondary | ICD-10-CM | POA: Diagnosis not present

## 2022-05-14 DIAGNOSIS — E11621 Type 2 diabetes mellitus with foot ulcer: Secondary | ICD-10-CM | POA: Diagnosis not present

## 2022-05-14 DIAGNOSIS — Z9181 History of falling: Secondary | ICD-10-CM | POA: Diagnosis not present

## 2022-05-14 DIAGNOSIS — Z981 Arthrodesis status: Secondary | ICD-10-CM | POA: Diagnosis not present

## 2022-05-14 DIAGNOSIS — L97518 Non-pressure chronic ulcer of other part of right foot with other specified severity: Secondary | ICD-10-CM | POA: Diagnosis not present

## 2022-05-14 DIAGNOSIS — M109 Gout, unspecified: Secondary | ICD-10-CM | POA: Diagnosis not present

## 2022-05-14 DIAGNOSIS — M19041 Primary osteoarthritis, right hand: Secondary | ICD-10-CM | POA: Diagnosis not present

## 2022-05-14 DIAGNOSIS — B957 Other staphylococcus as the cause of diseases classified elsewhere: Secondary | ICD-10-CM | POA: Diagnosis not present

## 2022-05-14 DIAGNOSIS — K5909 Other constipation: Secondary | ICD-10-CM | POA: Diagnosis not present

## 2022-05-14 DIAGNOSIS — E039 Hypothyroidism, unspecified: Secondary | ICD-10-CM | POA: Diagnosis not present

## 2022-05-14 DIAGNOSIS — M545 Low back pain, unspecified: Secondary | ICD-10-CM | POA: Diagnosis not present

## 2022-05-14 DIAGNOSIS — M86671 Other chronic osteomyelitis, right ankle and foot: Secondary | ICD-10-CM | POA: Diagnosis not present

## 2022-05-14 DIAGNOSIS — N179 Acute kidney failure, unspecified: Secondary | ICD-10-CM | POA: Diagnosis not present

## 2022-05-14 DIAGNOSIS — G894 Chronic pain syndrome: Secondary | ICD-10-CM | POA: Diagnosis not present

## 2022-05-16 ENCOUNTER — Telehealth: Payer: Self-pay

## 2022-05-16 ENCOUNTER — Inpatient Hospital Stay: Payer: Medicare Other | Admitting: Internal Medicine

## 2022-05-16 DIAGNOSIS — B957 Other staphylococcus as the cause of diseases classified elsewhere: Secondary | ICD-10-CM | POA: Diagnosis not present

## 2022-05-16 DIAGNOSIS — E1142 Type 2 diabetes mellitus with diabetic polyneuropathy: Secondary | ICD-10-CM | POA: Diagnosis not present

## 2022-05-16 DIAGNOSIS — G43909 Migraine, unspecified, not intractable, without status migrainosus: Secondary | ICD-10-CM | POA: Diagnosis not present

## 2022-05-16 DIAGNOSIS — M545 Low back pain, unspecified: Secondary | ICD-10-CM | POA: Diagnosis not present

## 2022-05-16 DIAGNOSIS — F17210 Nicotine dependence, cigarettes, uncomplicated: Secondary | ICD-10-CM | POA: Diagnosis not present

## 2022-05-16 DIAGNOSIS — J449 Chronic obstructive pulmonary disease, unspecified: Secondary | ICD-10-CM | POA: Diagnosis not present

## 2022-05-16 DIAGNOSIS — Z9181 History of falling: Secondary | ICD-10-CM | POA: Diagnosis not present

## 2022-05-16 DIAGNOSIS — G894 Chronic pain syndrome: Secondary | ICD-10-CM | POA: Diagnosis not present

## 2022-05-16 DIAGNOSIS — E7800 Pure hypercholesterolemia, unspecified: Secondary | ICD-10-CM | POA: Diagnosis not present

## 2022-05-16 DIAGNOSIS — N179 Acute kidney failure, unspecified: Secondary | ICD-10-CM | POA: Diagnosis not present

## 2022-05-16 DIAGNOSIS — Z981 Arthrodesis status: Secondary | ICD-10-CM | POA: Diagnosis not present

## 2022-05-16 DIAGNOSIS — I10 Essential (primary) hypertension: Secondary | ICD-10-CM | POA: Diagnosis not present

## 2022-05-16 DIAGNOSIS — E559 Vitamin D deficiency, unspecified: Secondary | ICD-10-CM | POA: Diagnosis not present

## 2022-05-16 DIAGNOSIS — E538 Deficiency of other specified B group vitamins: Secondary | ICD-10-CM | POA: Diagnosis not present

## 2022-05-16 DIAGNOSIS — L97518 Non-pressure chronic ulcer of other part of right foot with other specified severity: Secondary | ICD-10-CM | POA: Diagnosis not present

## 2022-05-16 DIAGNOSIS — M86671 Other chronic osteomyelitis, right ankle and foot: Secondary | ICD-10-CM | POA: Diagnosis not present

## 2022-05-16 DIAGNOSIS — K5909 Other constipation: Secondary | ICD-10-CM | POA: Diagnosis not present

## 2022-05-16 DIAGNOSIS — M109 Gout, unspecified: Secondary | ICD-10-CM | POA: Diagnosis not present

## 2022-05-16 DIAGNOSIS — M19042 Primary osteoarthritis, left hand: Secondary | ICD-10-CM | POA: Diagnosis not present

## 2022-05-16 DIAGNOSIS — Z794 Long term (current) use of insulin: Secondary | ICD-10-CM | POA: Diagnosis not present

## 2022-05-16 DIAGNOSIS — M19041 Primary osteoarthritis, right hand: Secondary | ICD-10-CM | POA: Diagnosis not present

## 2022-05-16 DIAGNOSIS — E1169 Type 2 diabetes mellitus with other specified complication: Secondary | ICD-10-CM | POA: Diagnosis not present

## 2022-05-16 DIAGNOSIS — E11621 Type 2 diabetes mellitus with foot ulcer: Secondary | ICD-10-CM | POA: Diagnosis not present

## 2022-05-16 DIAGNOSIS — E039 Hypothyroidism, unspecified: Secondary | ICD-10-CM | POA: Diagnosis not present

## 2022-05-16 NOTE — Telephone Encounter (Signed)
Called and spoke with patient, offered to reschedule missed hospital follow-up appointment this morning with Dr. Candiss Norse. Patient stated she was unaware of appointment.  Spoke with Dr. Candiss Norse, and relayed to patient that the provider would prefer an in-person follow up visit to assess wound. Patient politely declined, stated she was following with PCP and foot doctor, stated she was doing well and did not want to reschedule at this time.  Updated Dr. Candiss Norse with patient's response.  Binnie Kand, RN

## 2022-05-16 NOTE — Progress Notes (Deleted)
Patient Active Problem List   Diagnosis Date Noted   Osteomyelitis of foot ([place]) 04/08/2022   Finger pain, right 02/23/2022   Depression 02/23/2022   Thyroid nodule 09/09/2021   Statin myopathy 08/06/2021   Essential hypertension 10/09/2020   Pure hypercholesterolemia 10/09/2020   Chronic osteomyelitis involving right ankle and foot ([place])    Cellulitis of right lower leg 01/10/2020   Cellulitis 01/02/2020   Peroneal tendonitis, right    Capsulitis of metatarsophalangeal (MTP) joint of right foot    Exostosis of bone of foot    Polyneuropathy due to type 2 diabetes mellitus ([place]) 10/01/2018   Chronic pain 09/29/2018   Cellulitis of left lower leg    Cerumen impaction 08/02/2017   Constipation due to pain medication 05/22/2017   AKI (acute kidney injury) ([place]) 05/03/2017   Charcot foot due to diabetes mellitus (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 ([place]) 03/19/2012   Allergic rhinitis 03/19/2012   TTS (tarsal tunnel syndrome)    ARTHRALGIA 01/04/2010   CBC, ABNORMAL 05/01/2009   B12 deficiency 01/05/2009   GOUT 01/05/2009   Anxiety disorder 07/28/2008   TOBACCO USE DISORDER/SMOKER-SMOKING CESSATION DISCUSSED 04/21/2008   FOOT PAIN 04/21/2008   Diabetic neuropathy ([place]) 08/27/2007   LOW BACK PAIN 08/27/2007   Dyslipidemia 08/20/2007   Hypothyroidism 05/22/2007    Patient's Medications  New Prescriptions   No medications on file  Previous Medications   B-D INS SYR ULTRAFINE  1CC/31G 31G X 5/16" 1 ML MISC    USE AS DIRECTED   BISACODYL (DULCOLAX) 5 MG EC TABLET    Take 10 mg by mouth at bedtime.   BLOOD GLUCOSE MONITORING SUPPL (ONETOUCH VERIO) W/DEVICE KIT       CHOLECALCIFEROL (VITAMIN D3) 25 MCG (1000 UNIT) TABLET    Take 1,000 Units by mouth daily.   CLONAZEPAM (KLONOPIN) 0.5 MG TABLET    Take 1 tablet (0.5 mg total) by mouth 2 (two) times daily as needed for up to 5 days for anxiety.   COLCHICINE 0.6 MG TABLET    TAKE 2 TABLETS AS NEEDED GOUT ATTACK. THEN TAKE ANOTHER 1 TAB IN 1-2 HOURS. DONT REPEAT FOR 3 DAYS.   DOCUSATE SODIUM (COLACE) 100 MG CAPSULE    Take 100 mg by mouth at bedtime.   EZETIMIBE (ZETIA) 10 MG TABLET    TAKE 1 TABLET BY MOUTH DAILY   FLUOXETINE (PROZAC) 10 MG CAPSULE    Take 1 capsule (10 mg total) by mouth daily.   GABAPENTIN (NEURONTIN) 800 MG TABLET    TAKE 1 TABLET BY MOUTH 4  TIMES DAILY   INSULIN NPH-REGULAR HUMAN (70-30) 100 UNIT/ML INJECTION    Inject 20 Units into the skin 2 (two) times daily with a meal.   LAMOTRIGINE (LAMICTAL) 25 MG TABLET    TAKE 1 TABLET BY MOUTH  TWICE DAILY   LANCET DEVICES (ACCU-CHEK SOFTCLIX) LANCETS    Test two times daily   LANCETS (ONETOUCH DELICA PLUS [id number]) MISC  LEVOTHYROXINE (SYNTHROID) 75 MCG TABLET    TAKE 1 TABLET BY MOUTH  DAILY   LISINOPRIL (ZESTRIL) 10 MG TABLET    TAKE 1 TABLET BY MOUTH DAILY   LORATADINE (CLARITIN) 10 MG TABLET    Take 1 tablet (10 mg total) by mouth daily as needed for allergies.   ONDANSETRON (ZOFRAN) 4 MG TABLET    Take 4 mg by mouth every 8 (eight) hours as needed for nausea or vomiting.   OXYCODONE (ROXICODONE) 15 MG IMMEDIATE RELEASE TABLET    Take 1 tablet (15 mg total) by mouth every 6 (six) hours as needed for pain.   ROPINIROLE (REQUIP) 0.5 MG TABLET    TAKE 1 TABLET BY MOUTH AT  BEDTIME   SILVER SULFADIAZINE (SILVADENE) 1 % CREAM    APPLY PEA-SIZED AMOUNT TO WOUND DAILY.   VITAMIN B-12 (CYANOCOBALAMIN) 1000 MCG TABLET    Take 1,000 mcg by mouth daily.   Modified Medications   No medications on file  Discontinued Medications   No medications on file    Subjective: 56 year old female presents for hospital follow-up of right foot wound.  She was admitted to [facility] 6/9 - 6/20 for chronic right foot osteomyelitis.  She underwent I&D and biopsy of bone cultures of right foot.  Or cultures only grew staph stimulants.  To be contaminant.  Bone biopsy pathology showed no evidence of overgrowth.  She was discharged on cefadroxil 500 mg p.o. twice daily x 3weeks eot 7/4.  MRI on 5/21 of right foot showed chronic wound at the lateral aspect of the fifth TMT joint, underlying chronic osteomyelitis of the cuboid, fourth, fifth metatarsal bases. Of note she had a bone Bx of 04/3020 that of cuboid and 5 th metatarsal which did not show signs of acute inflammation . MRI on 05/02/20 had shown acute on chronic OM of cuboid, 4 and 5th metatarsal and bases.  Review of Systems: ROS  Past Medical History:  Diagnosis Date   Anxiety    Arthritis    hands   B12 deficiency    Chronic constipation    Chronic pain syndrome    neck, lower back, and foot   COPD (chronic obstructive pulmonary disease) ([place])    Diabetic ulcer of right foot ([place])    Gout    possible   History of seizure    05-26-2020  per pt as child and last one in late teen's   History of sepsis    due to lower extremity cellulits 2019 left , right 03/ 2021   History of syncope    Hyperlipidemia    Hypothyroidism    Impaired range of motion of cervical spine    per pt hx cervical fusion C4 -- 7,  has to be careful about turning her head to the right can cause her to pass out   Insulin dependent type 2 diabetes mellitus [facility])    endocrinologist--- dr Chalmers Cater    (05-26-2020 per pt check's blood sugar twice dialy and at times a third time,  fasting am blood sugar's---- 200 -- 250)   LBP (low back pain)    DR Patrice Paradise   Migraine    Peripheral neuropathy    RBBB (right bundle branch block)     Tenosynovitis of foot and ankle 09/03/2013   TTS (tarsal tunnel syndrome) 2010   Left Foot [facility] in [place]   Vitamin D deficiency    Wears glasses     Social History   Tobacco Use  Smoking status: Every Day    Packs/day: 1.00    Years: 10.00    Total pack years: 10.00    Types: Cigarettes   Smokeless tobacco: Never   Tobacco comments:    Trying to quit, smoking 1 ppd  Vaping Use   Vaping Use: Never used  Substance Use Topics   Alcohol use: No    Alcohol/week: 0.0 standard drinks of alcohol   Drug use: No    Family History  Problem Relation Age of Onset   Diabetes Mother    Hypertension Mother    Cancer Father 35       ? bone cancer   [name] Brother     Allergies  Allergen Reactions   Cephalexin Nausea And Vomiting   Demerol  [Meperidine Hcl] Rash   Lovastatin Other (See Comments)    Possible myalgia    Metformin And Related Nausea And Vomiting   Sulfamethoxazole-Trimethoprim     Other reaction(s): Unknown DILI, pancreatitis   Crestor [Rosuvastatin Calcium]     myalgia   Hydromorphone     Other reaction(s): Unknown   Niacin Other (See Comments)    Unknown    Other Other (See Comments)   Hydromorphone Hcl Itching    Patient has been tolerating Hydromorphone tablets without adverse effect (07/09/19) Other reaction(s): Unknown   Paroxetine Other (See Comments)    Unknown     Health Maintenance  Topic Date Due   COLONOSCOPY (Pts 45-66yr Insurance coverage will need to be confirmed)  Never done   MAMMOGRAM  01/14/2017   OPHTHALMOLOGY EXAM  11/01/2017   FOOT EXAM  11/13/2018   COVID-19 Vaccine (3 - Pfizer risk series) 10/26/2020   Diabetic kidney evaluation - Urine ACR  03/10/2022   INFLUENZA VACCINE  05/31/2022   HEMOGLOBIN A1C  10/10/2022   Diabetic kidney evaluation - GFR measurement  04/16/2023   TETANUS/TDAP  01/27/2031   Hepatitis C Screening  Completed   HIV Screening  Completed   Zoster Vaccines- Shingrix  Completed   HPV VACCINES  Aged  Out    Objective:  There were no vitals filed for this visit. There is no height or weight on file to calculate BMI.  Physical Exam  Lab Results Lab Results  Component Value Date   WBC 13.4 (H) 04/15/2022   HGB 14.0 04/15/2022   HCT 42.1 04/15/2022   MCV 85.1 04/15/2022   PLT 303 04/15/2022    Lab Results  Component Value Date   CREATININE 0.96 04/15/2022   BUN 17 04/15/2022   NA 139 04/15/2022   K 4.5 04/15/2022   CL 105 04/15/2022   CO2 26 04/15/2022    Lab Results  Component Value Date   ALT 12 04/08/2022   AST 12 (L) 04/08/2022   ALKPHOS 96 04/08/2022   BILITOT 0.4 04/08/2022    Lab Results  Component Value Date   CHOL 215 (H) 02/23/2022   HDL 34.70 (L) 02/23/2022   LDLCALC 81 01/02/2013   LDLDIRECT 135.0 02/23/2022   TRIG 301.0 (H) 02/23/2022   CHOLHDL 6 02/23/2022   No results found for: "LABRPR", "RPRTITER" HIV 1 RNA Quant (copies/mL)  Date Value  01/30/2014 <20     # Chronic right foot osteomyelitis SP I&D with Bx bone cortex right foot, application of abx beads. -Bone Bx did not show OM. Completed cefadroxil x3 weeks from last I&D on 6/14(EOT 7/4) -Similar presentation with negative Cx and pathology for oste in July, 2021 Plan Follow-up PRN  Laurice Record, MD [facility] 05/16/2022, 10:18 AM

## 2022-05-17 DIAGNOSIS — E1169 Type 2 diabetes mellitus with other specified complication: Secondary | ICD-10-CM | POA: Diagnosis not present

## 2022-05-17 DIAGNOSIS — E538 Deficiency of other specified B group vitamins: Secondary | ICD-10-CM | POA: Diagnosis not present

## 2022-05-17 DIAGNOSIS — E11621 Type 2 diabetes mellitus with foot ulcer: Secondary | ICD-10-CM | POA: Diagnosis not present

## 2022-05-17 DIAGNOSIS — I10 Essential (primary) hypertension: Secondary | ICD-10-CM | POA: Diagnosis not present

## 2022-05-17 DIAGNOSIS — M19041 Primary osteoarthritis, right hand: Secondary | ICD-10-CM | POA: Diagnosis not present

## 2022-05-17 DIAGNOSIS — E7800 Pure hypercholesterolemia, unspecified: Secondary | ICD-10-CM | POA: Diagnosis not present

## 2022-05-17 DIAGNOSIS — M19042 Primary osteoarthritis, left hand: Secondary | ICD-10-CM | POA: Diagnosis not present

## 2022-05-17 DIAGNOSIS — L97518 Non-pressure chronic ulcer of other part of right foot with other specified severity: Secondary | ICD-10-CM | POA: Diagnosis not present

## 2022-05-17 DIAGNOSIS — J449 Chronic obstructive pulmonary disease, unspecified: Secondary | ICD-10-CM | POA: Diagnosis not present

## 2022-05-17 DIAGNOSIS — B957 Other staphylococcus as the cause of diseases classified elsewhere: Secondary | ICD-10-CM | POA: Diagnosis not present

## 2022-05-17 DIAGNOSIS — Z9181 History of falling: Secondary | ICD-10-CM | POA: Diagnosis not present

## 2022-05-17 DIAGNOSIS — F17210 Nicotine dependence, cigarettes, uncomplicated: Secondary | ICD-10-CM | POA: Diagnosis not present

## 2022-05-17 DIAGNOSIS — M545 Low back pain, unspecified: Secondary | ICD-10-CM | POA: Diagnosis not present

## 2022-05-17 DIAGNOSIS — G43909 Migraine, unspecified, not intractable, without status migrainosus: Secondary | ICD-10-CM | POA: Diagnosis not present

## 2022-05-17 DIAGNOSIS — M86671 Other chronic osteomyelitis, right ankle and foot: Secondary | ICD-10-CM | POA: Diagnosis not present

## 2022-05-17 DIAGNOSIS — E1142 Type 2 diabetes mellitus with diabetic polyneuropathy: Secondary | ICD-10-CM | POA: Diagnosis not present

## 2022-05-17 DIAGNOSIS — N179 Acute kidney failure, unspecified: Secondary | ICD-10-CM | POA: Diagnosis not present

## 2022-05-17 DIAGNOSIS — E039 Hypothyroidism, unspecified: Secondary | ICD-10-CM | POA: Diagnosis not present

## 2022-05-17 DIAGNOSIS — Z981 Arthrodesis status: Secondary | ICD-10-CM | POA: Diagnosis not present

## 2022-05-17 DIAGNOSIS — K5909 Other constipation: Secondary | ICD-10-CM | POA: Diagnosis not present

## 2022-05-17 DIAGNOSIS — G894 Chronic pain syndrome: Secondary | ICD-10-CM | POA: Diagnosis not present

## 2022-05-17 DIAGNOSIS — E559 Vitamin D deficiency, unspecified: Secondary | ICD-10-CM | POA: Diagnosis not present

## 2022-05-17 DIAGNOSIS — M109 Gout, unspecified: Secondary | ICD-10-CM | POA: Diagnosis not present

## 2022-05-17 DIAGNOSIS — Z794 Long term (current) use of insulin: Secondary | ICD-10-CM | POA: Diagnosis not present

## 2022-05-18 ENCOUNTER — Ambulatory Visit (INDEPENDENT_AMBULATORY_CARE_PROVIDER_SITE_OTHER): Payer: Medicare Other | Admitting: Podiatry

## 2022-05-18 DIAGNOSIS — L97412 Non-pressure chronic ulcer of right heel and midfoot with fat layer exposed: Secondary | ICD-10-CM | POA: Diagnosis not present

## 2022-05-18 DIAGNOSIS — E11621 Type 2 diabetes mellitus with foot ulcer: Secondary | ICD-10-CM | POA: Diagnosis not present

## 2022-05-18 NOTE — Progress Notes (Signed)
Subjective:  Patient ID: Katherine Herrera, female    DOB: 22-Jul-1966,  MRN: [id number]  Chief Complaint  Patient presents with   Routine Post Op    3 week Ulcer follow up, minimal drainage, pain level around a 4, gets worse at night     DOS: 04/13/2022 Procedure: Right foot revisional I&D washout with Integra application/VAC  56 y.o. female returns for post-op check.  Patient states that she is doing okay.  The VAC is functioning well.  The graft is intact she is she is here to get her wound evaluated.  She is having nursing changing her dressing 3 times a week.  Review of Systems: Negative except as noted in the HPI. Denies N/V/F/Ch.  Past Medical History:  Diagnosis Date   Anxiety    Arthritis    hands   B12 deficiency    Chronic constipation    Chronic pain syndrome    neck, lower back, and foot   COPD (chronic obstructive pulmonary disease) ([place])    Diabetic ulcer of right foot ([place])    Gout    possible   History of seizure    05-26-2020  per pt as child and last one in late teen's   History of sepsis    due to lower extremity cellulits 2019 left , right 03/ 2021   History of syncope    Hyperlipidemia    Hypothyroidism    Impaired range of motion of cervical spine    per pt hx cervical fusion C4 -- 7,  has to be careful about turning her head to the right can cause her to pass out   Insulin dependent type 2 diabetes mellitus [facility])    endocrinologist--- dr Chalmers Cater    (05-26-2020 per pt check's blood sugar twice dialy and at times a third time,  fasting am blood sugar's---- 200 -- 250)   LBP (low back pain)    DR Patrice Paradise   Migraine    Peripheral neuropathy    RBBB (right bundle branch block)    Tenosynovitis of foot and ankle 09/03/2013   TTS (tarsal tunnel syndrome) 2010   Left Foot [facility] in [place]   Vitamin D deficiency    Wears glasses     Current Outpatient Medications:    B-D INS SYR ULTRAFINE 1CC/31G 31G X 5/16" 1 ML MISC, USE AS DIRECTED, Disp: 100 each, Rfl: 0    baclofen (LIORESAL) 10 MG tablet, TAKE 2 TABLETS BY MOUTH TWICE  DAILY, Disp: 180 tablet, Rfl: 1   bisacodyl (DULCOLAX) 5 MG EC tablet, Take 10 mg by mouth at bedtime., Disp: , Rfl:    Blood Glucose Monitoring Suppl (ONETOUCH VERIO) w/Device KIT, , Disp: , Rfl:    cholecalciferol (VITAMIN D3) 25 MCG (1000 UNIT) tablet, Take 1,000 Units by mouth daily., Disp: , Rfl:    colchicine 0.6 MG tablet, TAKE 2 TABLETS AS NEEDED GOUT ATTACK. THEN TAKE ANOTHER 1 TAB IN 1-2 HOURS. DONT REPEAT FOR 3 DAYS. (Patient taking differently: Take 0.6 mg by mouth daily as needed (gout).), Disp: 18 tablet, Rfl: 1   docusate sodium (COLACE) 100 MG capsule, Take 100 mg by mouth at bedtime., Disp: , Rfl:    ezetimibe (ZETIA) 10 MG tablet, TAKE 1 TABLET BY MOUTH DAILY, Disp: 100 tablet, Rfl: 2   FLUoxetine (PROZAC) 10 MG capsule, Take 1 capsule (10 mg total) by mouth daily., Disp: 90 capsule, Rfl: 3   gabapentin (NEURONTIN) 800 MG tablet, TAKE 1 TABLET BY MOUTH 4  TIMES DAILY (Patient taking differently: Take 800 mg by mouth 3 (three) times daily.), Disp: 360 tablet, Rfl: 3   insulin NPH-regular Human (70-30) 100 UNIT/ML injection, Inject 20 Units into the skin 2 (two) times daily with a meal. (Patient taking differently: Inject 30-40 Units into the skin See admin instructions. 30 units in the morning and afternoon and 40 units at night), Disp: 10 mL, Rfl: 11   lamoTRIgine (LAMICTAL) 25 MG tablet, TAKE 1 TABLET BY MOUTH  TWICE DAILY (Patient taking differently: Take 25 mg by mouth 2 (two) times daily.), Disp: 180 tablet, Rfl: 3   Lancet Devices (ACCU-CHEK SOFTCLIX) lancets, Test two times daily, Disp: 100 each, Rfl: 12   Lancets (ONETOUCH DELICA PLUS [id number]) MISC, , Disp: , Rfl:    levothyroxine (SYNTHROID) 75 MCG tablet, TAKE 1 TABLET BY MOUTH  DAILY (Patient taking differently: Take 75 mcg by mouth daily before breakfast.), Disp: 90 tablet, Rfl: 3   lisinopril (ZESTRIL) 10 MG tablet, TAKE 1 TABLET BY MOUTH DAILY, Disp: 100  tablet, Rfl: 2   loratadine (CLARITIN) 10 MG tablet, Take 1 tablet (10 mg total) by mouth daily as needed for allergies., Disp: 100 tablet, Rfl: 3   ondansetron (ZOFRAN) 4 MG tablet, Take 4 mg by mouth every 8 (eight) hours as needed for nausea or vomiting., Disp: , Rfl:    oxyCODONE (ROXICODONE) 15 MG immediate release tablet, Take 1 tablet (15 mg total) by mouth every 6 (six) hours as needed for pain., Disp: 120 tablet, Rfl: 0   rOPINIRole (REQUIP) 0.5 MG tablet, TAKE 1 TABLET BY MOUTH AT  BEDTIME (Patient taking differently: Take 0.5 mg by mouth daily.), Disp: 90 tablet, Rfl: 3   silver sulfADIAZINE (SILVADENE) 1 % cream, APPLY PEA-SIZED AMOUNT TO WOUND DAILY., Disp: 50 g, Rfl: 0   vitamin B-12 (CYANOCOBALAMIN) 1000 MCG tablet, Take 1,000 mcg by mouth daily., Disp: , Rfl:    clonazePAM (KLONOPIN) 0.5 MG tablet, Take 1 tablet (0.5 mg total) by mouth 2 (two) times daily as needed for up to 5 days for anxiety., Disp: 60 tablet, Rfl: 1  Social History   Tobacco Use  Smoking Status Every Day   Packs/day: 1.00   Years: 10.00   Total pack years: 10.00   Types: Cigarettes  Smokeless Tobacco Never  Tobacco Comments   Trying to quit, smoking 1 ppd    Allergies  Allergen Reactions   Cephalexin Nausea And Vomiting   Demerol  [Meperidine Hcl] Rash   Lovastatin Other (See Comments)    Possible myalgia    Metformin And Related Nausea And Vomiting   Sulfamethoxazole-Trimethoprim     Other reaction(s): Unknown DILI, pancreatitis   Crestor [Rosuvastatin Calcium]     myalgia   Hydromorphone     Other reaction(s): Unknown   Niacin Other (See Comments)    Unknown    Other Other (See Comments)   Hydromorphone Hcl Itching    Patient has been tolerating Hydromorphone tablets without adverse effect (07/09/19) Other reaction(s): Unknown   Paroxetine Other (See Comments)    Unknown    Objective:  There were no vitals filed for this visit. There is no height or weight on file to calculate  BMI. Constitutional Well developed. Well nourished.  Vascular Foot warm and well perfused. Capillary refill normal to all digits.   Neurologic Normal speech. Oriented to person, place, and time. Epicritic sensation to light touch grossly present bilaterally.  Dermatologic Granular wound bed noted to the right foot.  Debridement was  carried out minimally.  No purulent drainage noted slight malodor present likely due to maceration  Orthopedic: Tenderness to palpation noted about the surgical site.   Radiographs: None Assessment:   1. Diabetic ulcer of right midfoot associated with type 2 diabetes mellitus, with fat layer exposed ([place])     Plan:  Patient was evaluated and treated and all questions answered.  S/p foot surgery right -Progressing as expected post-operatively. -XR: See above -WB Status: Partial weightbearing to the heel/nonweightbearing in walker -Sutures/graft: Graft was removed granular wound bed noted. -Medications: None -Reapply the VAC on Friday.  Continue Betadine wet-to-dry dressing until the -Minimal debridement was carried out in standard technique.  No complication noted.  No follow-ups on file.

## 2022-05-19 ENCOUNTER — Ambulatory Visit: Payer: Medicare Other | Admitting: Internal Medicine

## 2022-05-19 DIAGNOSIS — G894 Chronic pain syndrome: Secondary | ICD-10-CM | POA: Diagnosis not present

## 2022-05-19 DIAGNOSIS — N179 Acute kidney failure, unspecified: Secondary | ICD-10-CM | POA: Diagnosis not present

## 2022-05-19 DIAGNOSIS — E039 Hypothyroidism, unspecified: Secondary | ICD-10-CM | POA: Diagnosis not present

## 2022-05-19 DIAGNOSIS — M545 Low back pain, unspecified: Secondary | ICD-10-CM | POA: Diagnosis not present

## 2022-05-19 DIAGNOSIS — E11621 Type 2 diabetes mellitus with foot ulcer: Secondary | ICD-10-CM | POA: Diagnosis not present

## 2022-05-19 DIAGNOSIS — M86671 Other chronic osteomyelitis, right ankle and foot: Secondary | ICD-10-CM | POA: Diagnosis not present

## 2022-05-19 DIAGNOSIS — E538 Deficiency of other specified B group vitamins: Secondary | ICD-10-CM | POA: Diagnosis not present

## 2022-05-19 DIAGNOSIS — I10 Essential (primary) hypertension: Secondary | ICD-10-CM | POA: Diagnosis not present

## 2022-05-19 DIAGNOSIS — J449 Chronic obstructive pulmonary disease, unspecified: Secondary | ICD-10-CM | POA: Diagnosis not present

## 2022-05-19 DIAGNOSIS — E1169 Type 2 diabetes mellitus with other specified complication: Secondary | ICD-10-CM | POA: Diagnosis not present

## 2022-05-19 DIAGNOSIS — Z981 Arthrodesis status: Secondary | ICD-10-CM | POA: Diagnosis not present

## 2022-05-19 DIAGNOSIS — F17210 Nicotine dependence, cigarettes, uncomplicated: Secondary | ICD-10-CM | POA: Diagnosis not present

## 2022-05-19 DIAGNOSIS — K5909 Other constipation: Secondary | ICD-10-CM | POA: Diagnosis not present

## 2022-05-19 DIAGNOSIS — L97518 Non-pressure chronic ulcer of other part of right foot with other specified severity: Secondary | ICD-10-CM | POA: Diagnosis not present

## 2022-05-19 DIAGNOSIS — B957 Other staphylococcus as the cause of diseases classified elsewhere: Secondary | ICD-10-CM | POA: Diagnosis not present

## 2022-05-19 DIAGNOSIS — Z9181 History of falling: Secondary | ICD-10-CM | POA: Diagnosis not present

## 2022-05-19 DIAGNOSIS — M109 Gout, unspecified: Secondary | ICD-10-CM | POA: Diagnosis not present

## 2022-05-19 DIAGNOSIS — E559 Vitamin D deficiency, unspecified: Secondary | ICD-10-CM | POA: Diagnosis not present

## 2022-05-19 DIAGNOSIS — E1142 Type 2 diabetes mellitus with diabetic polyneuropathy: Secondary | ICD-10-CM | POA: Diagnosis not present

## 2022-05-19 DIAGNOSIS — Z794 Long term (current) use of insulin: Secondary | ICD-10-CM | POA: Diagnosis not present

## 2022-05-19 DIAGNOSIS — M19042 Primary osteoarthritis, left hand: Secondary | ICD-10-CM | POA: Diagnosis not present

## 2022-05-19 DIAGNOSIS — G43909 Migraine, unspecified, not intractable, without status migrainosus: Secondary | ICD-10-CM | POA: Diagnosis not present

## 2022-05-19 DIAGNOSIS — M19041 Primary osteoarthritis, right hand: Secondary | ICD-10-CM | POA: Diagnosis not present

## 2022-05-19 DIAGNOSIS — E7800 Pure hypercholesterolemia, unspecified: Secondary | ICD-10-CM | POA: Diagnosis not present

## 2022-05-20 DIAGNOSIS — M19041 Primary osteoarthritis, right hand: Secondary | ICD-10-CM | POA: Diagnosis not present

## 2022-05-20 DIAGNOSIS — Z9181 History of falling: Secondary | ICD-10-CM | POA: Diagnosis not present

## 2022-05-20 DIAGNOSIS — E1142 Type 2 diabetes mellitus with diabetic polyneuropathy: Secondary | ICD-10-CM | POA: Diagnosis not present

## 2022-05-20 DIAGNOSIS — E7800 Pure hypercholesterolemia, unspecified: Secondary | ICD-10-CM | POA: Diagnosis not present

## 2022-05-20 DIAGNOSIS — E1169 Type 2 diabetes mellitus with other specified complication: Secondary | ICD-10-CM | POA: Diagnosis not present

## 2022-05-20 DIAGNOSIS — M19042 Primary osteoarthritis, left hand: Secondary | ICD-10-CM | POA: Diagnosis not present

## 2022-05-20 DIAGNOSIS — G43909 Migraine, unspecified, not intractable, without status migrainosus: Secondary | ICD-10-CM | POA: Diagnosis not present

## 2022-05-20 DIAGNOSIS — N179 Acute kidney failure, unspecified: Secondary | ICD-10-CM | POA: Diagnosis not present

## 2022-05-20 DIAGNOSIS — M109 Gout, unspecified: Secondary | ICD-10-CM | POA: Diagnosis not present

## 2022-05-20 DIAGNOSIS — Z794 Long term (current) use of insulin: Secondary | ICD-10-CM | POA: Diagnosis not present

## 2022-05-20 DIAGNOSIS — E039 Hypothyroidism, unspecified: Secondary | ICD-10-CM | POA: Diagnosis not present

## 2022-05-20 DIAGNOSIS — K5909 Other constipation: Secondary | ICD-10-CM | POA: Diagnosis not present

## 2022-05-20 DIAGNOSIS — M545 Low back pain, unspecified: Secondary | ICD-10-CM | POA: Diagnosis not present

## 2022-05-20 DIAGNOSIS — M86671 Other chronic osteomyelitis, right ankle and foot: Secondary | ICD-10-CM | POA: Diagnosis not present

## 2022-05-20 DIAGNOSIS — J449 Chronic obstructive pulmonary disease, unspecified: Secondary | ICD-10-CM | POA: Diagnosis not present

## 2022-05-20 DIAGNOSIS — G894 Chronic pain syndrome: Secondary | ICD-10-CM | POA: Diagnosis not present

## 2022-05-20 DIAGNOSIS — I10 Essential (primary) hypertension: Secondary | ICD-10-CM | POA: Diagnosis not present

## 2022-05-20 DIAGNOSIS — F17210 Nicotine dependence, cigarettes, uncomplicated: Secondary | ICD-10-CM | POA: Diagnosis not present

## 2022-05-20 DIAGNOSIS — E559 Vitamin D deficiency, unspecified: Secondary | ICD-10-CM | POA: Diagnosis not present

## 2022-05-20 DIAGNOSIS — E11621 Type 2 diabetes mellitus with foot ulcer: Secondary | ICD-10-CM | POA: Diagnosis not present

## 2022-05-20 DIAGNOSIS — L97518 Non-pressure chronic ulcer of other part of right foot with other specified severity: Secondary | ICD-10-CM | POA: Diagnosis not present

## 2022-05-20 DIAGNOSIS — E538 Deficiency of other specified B group vitamins: Secondary | ICD-10-CM | POA: Diagnosis not present

## 2022-05-20 DIAGNOSIS — B957 Other staphylococcus as the cause of diseases classified elsewhere: Secondary | ICD-10-CM | POA: Diagnosis not present

## 2022-05-20 DIAGNOSIS — Z981 Arthrodesis status: Secondary | ICD-10-CM | POA: Diagnosis not present

## 2022-05-21 DIAGNOSIS — F17210 Nicotine dependence, cigarettes, uncomplicated: Secondary | ICD-10-CM | POA: Diagnosis not present

## 2022-05-21 DIAGNOSIS — E7800 Pure hypercholesterolemia, unspecified: Secondary | ICD-10-CM | POA: Diagnosis not present

## 2022-05-21 DIAGNOSIS — E039 Hypothyroidism, unspecified: Secondary | ICD-10-CM | POA: Diagnosis not present

## 2022-05-21 DIAGNOSIS — M19041 Primary osteoarthritis, right hand: Secondary | ICD-10-CM | POA: Diagnosis not present

## 2022-05-21 DIAGNOSIS — M109 Gout, unspecified: Secondary | ICD-10-CM | POA: Diagnosis not present

## 2022-05-21 DIAGNOSIS — Z981 Arthrodesis status: Secondary | ICD-10-CM | POA: Diagnosis not present

## 2022-05-21 DIAGNOSIS — E538 Deficiency of other specified B group vitamins: Secondary | ICD-10-CM | POA: Diagnosis not present

## 2022-05-21 DIAGNOSIS — Z9181 History of falling: Secondary | ICD-10-CM | POA: Diagnosis not present

## 2022-05-21 DIAGNOSIS — M86671 Other chronic osteomyelitis, right ankle and foot: Secondary | ICD-10-CM | POA: Diagnosis not present

## 2022-05-21 DIAGNOSIS — N179 Acute kidney failure, unspecified: Secondary | ICD-10-CM | POA: Diagnosis not present

## 2022-05-21 DIAGNOSIS — B957 Other staphylococcus as the cause of diseases classified elsewhere: Secondary | ICD-10-CM | POA: Diagnosis not present

## 2022-05-21 DIAGNOSIS — L97518 Non-pressure chronic ulcer of other part of right foot with other specified severity: Secondary | ICD-10-CM | POA: Diagnosis not present

## 2022-05-21 DIAGNOSIS — E11621 Type 2 diabetes mellitus with foot ulcer: Secondary | ICD-10-CM | POA: Diagnosis not present

## 2022-05-21 DIAGNOSIS — Z794 Long term (current) use of insulin: Secondary | ICD-10-CM | POA: Diagnosis not present

## 2022-05-21 DIAGNOSIS — E1142 Type 2 diabetes mellitus with diabetic polyneuropathy: Secondary | ICD-10-CM | POA: Diagnosis not present

## 2022-05-21 DIAGNOSIS — E559 Vitamin D deficiency, unspecified: Secondary | ICD-10-CM | POA: Diagnosis not present

## 2022-05-21 DIAGNOSIS — G43909 Migraine, unspecified, not intractable, without status migrainosus: Secondary | ICD-10-CM | POA: Diagnosis not present

## 2022-05-21 DIAGNOSIS — M545 Low back pain, unspecified: Secondary | ICD-10-CM | POA: Diagnosis not present

## 2022-05-21 DIAGNOSIS — K5909 Other constipation: Secondary | ICD-10-CM | POA: Diagnosis not present

## 2022-05-21 DIAGNOSIS — I10 Essential (primary) hypertension: Secondary | ICD-10-CM | POA: Diagnosis not present

## 2022-05-21 DIAGNOSIS — J449 Chronic obstructive pulmonary disease, unspecified: Secondary | ICD-10-CM | POA: Diagnosis not present

## 2022-05-21 DIAGNOSIS — M19042 Primary osteoarthritis, left hand: Secondary | ICD-10-CM | POA: Diagnosis not present

## 2022-05-21 DIAGNOSIS — G894 Chronic pain syndrome: Secondary | ICD-10-CM | POA: Diagnosis not present

## 2022-05-21 DIAGNOSIS — E1169 Type 2 diabetes mellitus with other specified complication: Secondary | ICD-10-CM | POA: Diagnosis not present

## 2022-05-23 DIAGNOSIS — G43909 Migraine, unspecified, not intractable, without status migrainosus: Secondary | ICD-10-CM | POA: Diagnosis not present

## 2022-05-23 DIAGNOSIS — L97518 Non-pressure chronic ulcer of other part of right foot with other specified severity: Secondary | ICD-10-CM | POA: Diagnosis not present

## 2022-05-23 DIAGNOSIS — E559 Vitamin D deficiency, unspecified: Secondary | ICD-10-CM | POA: Diagnosis not present

## 2022-05-23 DIAGNOSIS — M19041 Primary osteoarthritis, right hand: Secondary | ICD-10-CM | POA: Diagnosis not present

## 2022-05-23 DIAGNOSIS — F17210 Nicotine dependence, cigarettes, uncomplicated: Secondary | ICD-10-CM | POA: Diagnosis not present

## 2022-05-23 DIAGNOSIS — Z981 Arthrodesis status: Secondary | ICD-10-CM | POA: Diagnosis not present

## 2022-05-23 DIAGNOSIS — M19042 Primary osteoarthritis, left hand: Secondary | ICD-10-CM | POA: Diagnosis not present

## 2022-05-23 DIAGNOSIS — K5909 Other constipation: Secondary | ICD-10-CM | POA: Diagnosis not present

## 2022-05-23 DIAGNOSIS — E538 Deficiency of other specified B group vitamins: Secondary | ICD-10-CM | POA: Diagnosis not present

## 2022-05-23 DIAGNOSIS — E1169 Type 2 diabetes mellitus with other specified complication: Secondary | ICD-10-CM | POA: Diagnosis not present

## 2022-05-23 DIAGNOSIS — E1142 Type 2 diabetes mellitus with diabetic polyneuropathy: Secondary | ICD-10-CM | POA: Diagnosis not present

## 2022-05-23 DIAGNOSIS — Z9181 History of falling: Secondary | ICD-10-CM | POA: Diagnosis not present

## 2022-05-23 DIAGNOSIS — N179 Acute kidney failure, unspecified: Secondary | ICD-10-CM | POA: Diagnosis not present

## 2022-05-23 DIAGNOSIS — M86671 Other chronic osteomyelitis, right ankle and foot: Secondary | ICD-10-CM | POA: Diagnosis not present

## 2022-05-23 DIAGNOSIS — M109 Gout, unspecified: Secondary | ICD-10-CM | POA: Diagnosis not present

## 2022-05-23 DIAGNOSIS — G894 Chronic pain syndrome: Secondary | ICD-10-CM | POA: Diagnosis not present

## 2022-05-23 DIAGNOSIS — M545 Low back pain, unspecified: Secondary | ICD-10-CM | POA: Diagnosis not present

## 2022-05-23 DIAGNOSIS — I10 Essential (primary) hypertension: Secondary | ICD-10-CM | POA: Diagnosis not present

## 2022-05-23 DIAGNOSIS — Z794 Long term (current) use of insulin: Secondary | ICD-10-CM | POA: Diagnosis not present

## 2022-05-23 DIAGNOSIS — E7800 Pure hypercholesterolemia, unspecified: Secondary | ICD-10-CM | POA: Diagnosis not present

## 2022-05-23 DIAGNOSIS — J449 Chronic obstructive pulmonary disease, unspecified: Secondary | ICD-10-CM | POA: Diagnosis not present

## 2022-05-23 DIAGNOSIS — B957 Other staphylococcus as the cause of diseases classified elsewhere: Secondary | ICD-10-CM | POA: Diagnosis not present

## 2022-05-23 DIAGNOSIS — E11621 Type 2 diabetes mellitus with foot ulcer: Secondary | ICD-10-CM | POA: Diagnosis not present

## 2022-05-23 DIAGNOSIS — E039 Hypothyroidism, unspecified: Secondary | ICD-10-CM | POA: Diagnosis not present

## 2022-05-24 DIAGNOSIS — M545 Low back pain, unspecified: Secondary | ICD-10-CM | POA: Diagnosis not present

## 2022-05-24 DIAGNOSIS — E559 Vitamin D deficiency, unspecified: Secondary | ICD-10-CM | POA: Diagnosis not present

## 2022-05-24 DIAGNOSIS — Z981 Arthrodesis status: Secondary | ICD-10-CM | POA: Diagnosis not present

## 2022-05-24 DIAGNOSIS — F17210 Nicotine dependence, cigarettes, uncomplicated: Secondary | ICD-10-CM | POA: Diagnosis not present

## 2022-05-24 DIAGNOSIS — L97518 Non-pressure chronic ulcer of other part of right foot with other specified severity: Secondary | ICD-10-CM | POA: Diagnosis not present

## 2022-05-24 DIAGNOSIS — I10 Essential (primary) hypertension: Secondary | ICD-10-CM | POA: Diagnosis not present

## 2022-05-24 DIAGNOSIS — E039 Hypothyroidism, unspecified: Secondary | ICD-10-CM | POA: Diagnosis not present

## 2022-05-24 DIAGNOSIS — E1142 Type 2 diabetes mellitus with diabetic polyneuropathy: Secondary | ICD-10-CM | POA: Diagnosis not present

## 2022-05-24 DIAGNOSIS — K5909 Other constipation: Secondary | ICD-10-CM | POA: Diagnosis not present

## 2022-05-24 DIAGNOSIS — E1169 Type 2 diabetes mellitus with other specified complication: Secondary | ICD-10-CM | POA: Diagnosis not present

## 2022-05-24 DIAGNOSIS — B957 Other staphylococcus as the cause of diseases classified elsewhere: Secondary | ICD-10-CM | POA: Diagnosis not present

## 2022-05-24 DIAGNOSIS — E11621 Type 2 diabetes mellitus with foot ulcer: Secondary | ICD-10-CM | POA: Diagnosis not present

## 2022-05-24 DIAGNOSIS — E538 Deficiency of other specified B group vitamins: Secondary | ICD-10-CM | POA: Diagnosis not present

## 2022-05-24 DIAGNOSIS — M19042 Primary osteoarthritis, left hand: Secondary | ICD-10-CM | POA: Diagnosis not present

## 2022-05-24 DIAGNOSIS — J449 Chronic obstructive pulmonary disease, unspecified: Secondary | ICD-10-CM | POA: Diagnosis not present

## 2022-05-24 DIAGNOSIS — M19041 Primary osteoarthritis, right hand: Secondary | ICD-10-CM | POA: Diagnosis not present

## 2022-05-24 DIAGNOSIS — Z9181 History of falling: Secondary | ICD-10-CM | POA: Diagnosis not present

## 2022-05-24 DIAGNOSIS — Z794 Long term (current) use of insulin: Secondary | ICD-10-CM | POA: Diagnosis not present

## 2022-05-24 DIAGNOSIS — M109 Gout, unspecified: Secondary | ICD-10-CM | POA: Diagnosis not present

## 2022-05-24 DIAGNOSIS — N179 Acute kidney failure, unspecified: Secondary | ICD-10-CM | POA: Diagnosis not present

## 2022-05-24 DIAGNOSIS — G43909 Migraine, unspecified, not intractable, without status migrainosus: Secondary | ICD-10-CM | POA: Diagnosis not present

## 2022-05-24 DIAGNOSIS — G894 Chronic pain syndrome: Secondary | ICD-10-CM | POA: Diagnosis not present

## 2022-05-24 DIAGNOSIS — E7800 Pure hypercholesterolemia, unspecified: Secondary | ICD-10-CM | POA: Diagnosis not present

## 2022-05-24 DIAGNOSIS — M86671 Other chronic osteomyelitis, right ankle and foot: Secondary | ICD-10-CM | POA: Diagnosis not present

## 2022-05-25 ENCOUNTER — Telehealth: Payer: Self-pay | Admitting: Internal Medicine

## 2022-05-25 ENCOUNTER — Ambulatory Visit: Payer: Medicare Other | Admitting: Internal Medicine

## 2022-05-25 DIAGNOSIS — Z981 Arthrodesis status: Secondary | ICD-10-CM | POA: Diagnosis not present

## 2022-05-25 DIAGNOSIS — E7800 Pure hypercholesterolemia, unspecified: Secondary | ICD-10-CM | POA: Diagnosis not present

## 2022-05-25 DIAGNOSIS — B957 Other staphylococcus as the cause of diseases classified elsewhere: Secondary | ICD-10-CM | POA: Diagnosis not present

## 2022-05-25 DIAGNOSIS — Z9181 History of falling: Secondary | ICD-10-CM | POA: Diagnosis not present

## 2022-05-25 DIAGNOSIS — M109 Gout, unspecified: Secondary | ICD-10-CM | POA: Diagnosis not present

## 2022-05-25 DIAGNOSIS — G894 Chronic pain syndrome: Secondary | ICD-10-CM | POA: Diagnosis not present

## 2022-05-25 DIAGNOSIS — M19041 Primary osteoarthritis, right hand: Secondary | ICD-10-CM | POA: Diagnosis not present

## 2022-05-25 DIAGNOSIS — E559 Vitamin D deficiency, unspecified: Secondary | ICD-10-CM | POA: Diagnosis not present

## 2022-05-25 DIAGNOSIS — I10 Essential (primary) hypertension: Secondary | ICD-10-CM | POA: Diagnosis not present

## 2022-05-25 DIAGNOSIS — G43909 Migraine, unspecified, not intractable, without status migrainosus: Secondary | ICD-10-CM | POA: Diagnosis not present

## 2022-05-25 DIAGNOSIS — E11621 Type 2 diabetes mellitus with foot ulcer: Secondary | ICD-10-CM | POA: Diagnosis not present

## 2022-05-25 DIAGNOSIS — K5909 Other constipation: Secondary | ICD-10-CM | POA: Diagnosis not present

## 2022-05-25 DIAGNOSIS — N179 Acute kidney failure, unspecified: Secondary | ICD-10-CM | POA: Diagnosis not present

## 2022-05-25 DIAGNOSIS — E039 Hypothyroidism, unspecified: Secondary | ICD-10-CM | POA: Diagnosis not present

## 2022-05-25 DIAGNOSIS — M86671 Other chronic osteomyelitis, right ankle and foot: Secondary | ICD-10-CM | POA: Diagnosis not present

## 2022-05-25 DIAGNOSIS — L97518 Non-pressure chronic ulcer of other part of right foot with other specified severity: Secondary | ICD-10-CM | POA: Diagnosis not present

## 2022-05-25 DIAGNOSIS — J449 Chronic obstructive pulmonary disease, unspecified: Secondary | ICD-10-CM | POA: Diagnosis not present

## 2022-05-25 DIAGNOSIS — M19042 Primary osteoarthritis, left hand: Secondary | ICD-10-CM | POA: Diagnosis not present

## 2022-05-25 DIAGNOSIS — E538 Deficiency of other specified B group vitamins: Secondary | ICD-10-CM | POA: Diagnosis not present

## 2022-05-25 DIAGNOSIS — E1142 Type 2 diabetes mellitus with diabetic polyneuropathy: Secondary | ICD-10-CM | POA: Diagnosis not present

## 2022-05-25 DIAGNOSIS — M545 Low back pain, unspecified: Secondary | ICD-10-CM | POA: Diagnosis not present

## 2022-05-25 DIAGNOSIS — Z794 Long term (current) use of insulin: Secondary | ICD-10-CM | POA: Diagnosis not present

## 2022-05-25 DIAGNOSIS — F17210 Nicotine dependence, cigarettes, uncomplicated: Secondary | ICD-10-CM | POA: Diagnosis not present

## 2022-05-25 DIAGNOSIS — E1169 Type 2 diabetes mellitus with other specified complication: Secondary | ICD-10-CM | POA: Diagnosis not present

## 2022-05-25 NOTE — Telephone Encounter (Signed)
[facility] calling to advise that patient had a fall on 05/24/2021 at her friends home.  Suffered abrasion to left lower leg and patient is complaining of pain in that area.   They do not feel like patient needs an xray - advised patient to see how it is feeling tomorrow and if no better to call to get an appointment to be seen.

## 2022-05-25 NOTE — Telephone Encounter (Signed)
MD is out of the office until 05/30/22..Will forward to his desktop to review when he come in on Monday.Marland KitchenJohny Chess

## 2022-05-27 DIAGNOSIS — Z9181 History of falling: Secondary | ICD-10-CM | POA: Diagnosis not present

## 2022-05-27 DIAGNOSIS — L97518 Non-pressure chronic ulcer of other part of right foot with other specified severity: Secondary | ICD-10-CM | POA: Diagnosis not present

## 2022-05-27 DIAGNOSIS — M19041 Primary osteoarthritis, right hand: Secondary | ICD-10-CM | POA: Diagnosis not present

## 2022-05-27 DIAGNOSIS — E1169 Type 2 diabetes mellitus with other specified complication: Secondary | ICD-10-CM | POA: Diagnosis not present

## 2022-05-27 DIAGNOSIS — E559 Vitamin D deficiency, unspecified: Secondary | ICD-10-CM | POA: Diagnosis not present

## 2022-05-27 DIAGNOSIS — N179 Acute kidney failure, unspecified: Secondary | ICD-10-CM | POA: Diagnosis not present

## 2022-05-27 DIAGNOSIS — E538 Deficiency of other specified B group vitamins: Secondary | ICD-10-CM | POA: Diagnosis not present

## 2022-05-27 DIAGNOSIS — K5909 Other constipation: Secondary | ICD-10-CM | POA: Diagnosis not present

## 2022-05-27 DIAGNOSIS — B957 Other staphylococcus as the cause of diseases classified elsewhere: Secondary | ICD-10-CM | POA: Diagnosis not present

## 2022-05-27 DIAGNOSIS — M109 Gout, unspecified: Secondary | ICD-10-CM | POA: Diagnosis not present

## 2022-05-27 DIAGNOSIS — G43909 Migraine, unspecified, not intractable, without status migrainosus: Secondary | ICD-10-CM | POA: Diagnosis not present

## 2022-05-27 DIAGNOSIS — Z981 Arthrodesis status: Secondary | ICD-10-CM | POA: Diagnosis not present

## 2022-05-27 DIAGNOSIS — I10 Essential (primary) hypertension: Secondary | ICD-10-CM | POA: Diagnosis not present

## 2022-05-27 DIAGNOSIS — M19042 Primary osteoarthritis, left hand: Secondary | ICD-10-CM | POA: Diagnosis not present

## 2022-05-27 DIAGNOSIS — E039 Hypothyroidism, unspecified: Secondary | ICD-10-CM | POA: Diagnosis not present

## 2022-05-27 DIAGNOSIS — G894 Chronic pain syndrome: Secondary | ICD-10-CM | POA: Diagnosis not present

## 2022-05-27 DIAGNOSIS — E7800 Pure hypercholesterolemia, unspecified: Secondary | ICD-10-CM | POA: Diagnosis not present

## 2022-05-27 DIAGNOSIS — J449 Chronic obstructive pulmonary disease, unspecified: Secondary | ICD-10-CM | POA: Diagnosis not present

## 2022-05-27 DIAGNOSIS — M545 Low back pain, unspecified: Secondary | ICD-10-CM | POA: Diagnosis not present

## 2022-05-27 DIAGNOSIS — M86671 Other chronic osteomyelitis, right ankle and foot: Secondary | ICD-10-CM | POA: Diagnosis not present

## 2022-05-27 DIAGNOSIS — E11621 Type 2 diabetes mellitus with foot ulcer: Secondary | ICD-10-CM | POA: Diagnosis not present

## 2022-05-27 DIAGNOSIS — F17210 Nicotine dependence, cigarettes, uncomplicated: Secondary | ICD-10-CM | POA: Diagnosis not present

## 2022-05-27 DIAGNOSIS — E1142 Type 2 diabetes mellitus with diabetic polyneuropathy: Secondary | ICD-10-CM | POA: Diagnosis not present

## 2022-05-27 DIAGNOSIS — Z794 Long term (current) use of insulin: Secondary | ICD-10-CM | POA: Diagnosis not present

## 2022-05-28 DIAGNOSIS — M86671 Other chronic osteomyelitis, right ankle and foot: Secondary | ICD-10-CM | POA: Diagnosis not present

## 2022-05-30 DIAGNOSIS — J449 Chronic obstructive pulmonary disease, unspecified: Secondary | ICD-10-CM | POA: Diagnosis not present

## 2022-05-30 DIAGNOSIS — M86671 Other chronic osteomyelitis, right ankle and foot: Secondary | ICD-10-CM | POA: Diagnosis not present

## 2022-05-30 DIAGNOSIS — I10 Essential (primary) hypertension: Secondary | ICD-10-CM | POA: Diagnosis not present

## 2022-05-30 DIAGNOSIS — E1140 Type 2 diabetes mellitus with diabetic neuropathy, unspecified: Secondary | ICD-10-CM

## 2022-05-30 DIAGNOSIS — L97508 Non-pressure chronic ulcer of other part of unspecified foot with other specified severity: Secondary | ICD-10-CM | POA: Diagnosis not present

## 2022-05-30 DIAGNOSIS — Z981 Arthrodesis status: Secondary | ICD-10-CM | POA: Diagnosis not present

## 2022-05-30 DIAGNOSIS — E1142 Type 2 diabetes mellitus with diabetic polyneuropathy: Secondary | ICD-10-CM | POA: Diagnosis not present

## 2022-05-30 DIAGNOSIS — M109 Gout, unspecified: Secondary | ICD-10-CM | POA: Diagnosis not present

## 2022-05-30 DIAGNOSIS — M19042 Primary osteoarthritis, left hand: Secondary | ICD-10-CM | POA: Diagnosis not present

## 2022-05-30 DIAGNOSIS — E039 Hypothyroidism, unspecified: Secondary | ICD-10-CM | POA: Diagnosis not present

## 2022-05-30 DIAGNOSIS — E785 Hyperlipidemia, unspecified: Secondary | ICD-10-CM | POA: Diagnosis not present

## 2022-05-30 DIAGNOSIS — E11621 Type 2 diabetes mellitus with foot ulcer: Secondary | ICD-10-CM | POA: Diagnosis not present

## 2022-05-30 DIAGNOSIS — Z9181 History of falling: Secondary | ICD-10-CM | POA: Diagnosis not present

## 2022-05-30 DIAGNOSIS — L97518 Non-pressure chronic ulcer of other part of right foot with other specified severity: Secondary | ICD-10-CM | POA: Diagnosis not present

## 2022-05-30 DIAGNOSIS — E538 Deficiency of other specified B group vitamins: Secondary | ICD-10-CM | POA: Diagnosis not present

## 2022-05-30 DIAGNOSIS — Z794 Long term (current) use of insulin: Secondary | ICD-10-CM | POA: Diagnosis not present

## 2022-05-30 DIAGNOSIS — G894 Chronic pain syndrome: Secondary | ICD-10-CM | POA: Diagnosis not present

## 2022-05-30 DIAGNOSIS — B957 Other staphylococcus as the cause of diseases classified elsewhere: Secondary | ICD-10-CM | POA: Diagnosis not present

## 2022-05-30 DIAGNOSIS — K5909 Other constipation: Secondary | ICD-10-CM | POA: Diagnosis not present

## 2022-05-30 DIAGNOSIS — E1169 Type 2 diabetes mellitus with other specified complication: Secondary | ICD-10-CM | POA: Diagnosis not present

## 2022-05-30 DIAGNOSIS — G43909 Migraine, unspecified, not intractable, without status migrainosus: Secondary | ICD-10-CM | POA: Diagnosis not present

## 2022-05-30 DIAGNOSIS — F17210 Nicotine dependence, cigarettes, uncomplicated: Secondary | ICD-10-CM | POA: Diagnosis not present

## 2022-05-30 DIAGNOSIS — E559 Vitamin D deficiency, unspecified: Secondary | ICD-10-CM | POA: Diagnosis not present

## 2022-05-30 DIAGNOSIS — M545 Low back pain, unspecified: Secondary | ICD-10-CM | POA: Diagnosis not present

## 2022-05-30 DIAGNOSIS — N179 Acute kidney failure, unspecified: Secondary | ICD-10-CM | POA: Diagnosis not present

## 2022-05-30 DIAGNOSIS — M19041 Primary osteoarthritis, right hand: Secondary | ICD-10-CM | POA: Diagnosis not present

## 2022-05-30 DIAGNOSIS — E7800 Pure hypercholesterolemia, unspecified: Secondary | ICD-10-CM | POA: Diagnosis not present

## 2022-05-31 DIAGNOSIS — M86671 Other chronic osteomyelitis, right ankle and foot: Secondary | ICD-10-CM | POA: Diagnosis not present

## 2022-05-31 DIAGNOSIS — L02611 Cutaneous abscess of right foot: Secondary | ICD-10-CM | POA: Diagnosis not present

## 2022-05-31 DIAGNOSIS — E08621 Diabetes mellitus due to underlying condition with foot ulcer: Secondary | ICD-10-CM | POA: Diagnosis not present

## 2022-06-01 DIAGNOSIS — J449 Chronic obstructive pulmonary disease, unspecified: Secondary | ICD-10-CM | POA: Diagnosis not present

## 2022-06-01 DIAGNOSIS — Z794 Long term (current) use of insulin: Secondary | ICD-10-CM | POA: Diagnosis not present

## 2022-06-01 DIAGNOSIS — M86671 Other chronic osteomyelitis, right ankle and foot: Secondary | ICD-10-CM | POA: Diagnosis not present

## 2022-06-01 DIAGNOSIS — K5909 Other constipation: Secondary | ICD-10-CM | POA: Diagnosis not present

## 2022-06-01 DIAGNOSIS — F17210 Nicotine dependence, cigarettes, uncomplicated: Secondary | ICD-10-CM | POA: Diagnosis not present

## 2022-06-01 DIAGNOSIS — E1169 Type 2 diabetes mellitus with other specified complication: Secondary | ICD-10-CM | POA: Diagnosis not present

## 2022-06-01 DIAGNOSIS — E039 Hypothyroidism, unspecified: Secondary | ICD-10-CM | POA: Diagnosis not present

## 2022-06-01 DIAGNOSIS — L97518 Non-pressure chronic ulcer of other part of right foot with other specified severity: Secondary | ICD-10-CM | POA: Diagnosis not present

## 2022-06-01 DIAGNOSIS — E1142 Type 2 diabetes mellitus with diabetic polyneuropathy: Secondary | ICD-10-CM | POA: Diagnosis not present

## 2022-06-01 DIAGNOSIS — E7800 Pure hypercholesterolemia, unspecified: Secondary | ICD-10-CM | POA: Diagnosis not present

## 2022-06-01 DIAGNOSIS — G43909 Migraine, unspecified, not intractable, without status migrainosus: Secondary | ICD-10-CM | POA: Diagnosis not present

## 2022-06-01 DIAGNOSIS — E538 Deficiency of other specified B group vitamins: Secondary | ICD-10-CM | POA: Diagnosis not present

## 2022-06-01 DIAGNOSIS — M19041 Primary osteoarthritis, right hand: Secondary | ICD-10-CM | POA: Diagnosis not present

## 2022-06-01 DIAGNOSIS — Z9181 History of falling: Secondary | ICD-10-CM | POA: Diagnosis not present

## 2022-06-01 DIAGNOSIS — E11621 Type 2 diabetes mellitus with foot ulcer: Secondary | ICD-10-CM | POA: Diagnosis not present

## 2022-06-01 DIAGNOSIS — Z981 Arthrodesis status: Secondary | ICD-10-CM | POA: Diagnosis not present

## 2022-06-01 DIAGNOSIS — M19042 Primary osteoarthritis, left hand: Secondary | ICD-10-CM | POA: Diagnosis not present

## 2022-06-01 DIAGNOSIS — M545 Low back pain, unspecified: Secondary | ICD-10-CM | POA: Diagnosis not present

## 2022-06-01 DIAGNOSIS — I10 Essential (primary) hypertension: Secondary | ICD-10-CM | POA: Diagnosis not present

## 2022-06-01 DIAGNOSIS — N179 Acute kidney failure, unspecified: Secondary | ICD-10-CM | POA: Diagnosis not present

## 2022-06-01 DIAGNOSIS — G894 Chronic pain syndrome: Secondary | ICD-10-CM | POA: Diagnosis not present

## 2022-06-01 DIAGNOSIS — M109 Gout, unspecified: Secondary | ICD-10-CM | POA: Diagnosis not present

## 2022-06-01 DIAGNOSIS — E559 Vitamin D deficiency, unspecified: Secondary | ICD-10-CM | POA: Diagnosis not present

## 2022-06-01 DIAGNOSIS — B957 Other staphylococcus as the cause of diseases classified elsewhere: Secondary | ICD-10-CM | POA: Diagnosis not present

## 2022-06-02 DIAGNOSIS — E08621 Diabetes mellitus due to underlying condition with foot ulcer: Secondary | ICD-10-CM | POA: Diagnosis not present

## 2022-06-02 DIAGNOSIS — L02611 Cutaneous abscess of right foot: Secondary | ICD-10-CM | POA: Diagnosis not present

## 2022-06-02 DIAGNOSIS — M86671 Other chronic osteomyelitis, right ankle and foot: Secondary | ICD-10-CM | POA: Diagnosis not present

## 2022-06-02 LAB — ACID FAST CULTURE WITH REFLEXED SENSITIVITIES (MYCOBACTERIA): Acid Fast Culture: NEGATIVE

## 2022-06-03 DIAGNOSIS — E559 Vitamin D deficiency, unspecified: Secondary | ICD-10-CM | POA: Diagnosis not present

## 2022-06-03 DIAGNOSIS — Z981 Arthrodesis status: Secondary | ICD-10-CM | POA: Diagnosis not present

## 2022-06-03 DIAGNOSIS — K5909 Other constipation: Secondary | ICD-10-CM | POA: Diagnosis not present

## 2022-06-03 DIAGNOSIS — I10 Essential (primary) hypertension: Secondary | ICD-10-CM | POA: Diagnosis not present

## 2022-06-03 DIAGNOSIS — G43909 Migraine, unspecified, not intractable, without status migrainosus: Secondary | ICD-10-CM | POA: Diagnosis not present

## 2022-06-03 DIAGNOSIS — E11621 Type 2 diabetes mellitus with foot ulcer: Secondary | ICD-10-CM | POA: Diagnosis not present

## 2022-06-03 DIAGNOSIS — E1169 Type 2 diabetes mellitus with other specified complication: Secondary | ICD-10-CM | POA: Diagnosis not present

## 2022-06-03 DIAGNOSIS — N179 Acute kidney failure, unspecified: Secondary | ICD-10-CM | POA: Diagnosis not present

## 2022-06-03 DIAGNOSIS — B957 Other staphylococcus as the cause of diseases classified elsewhere: Secondary | ICD-10-CM | POA: Diagnosis not present

## 2022-06-03 DIAGNOSIS — L97518 Non-pressure chronic ulcer of other part of right foot with other specified severity: Secondary | ICD-10-CM | POA: Diagnosis not present

## 2022-06-03 DIAGNOSIS — Z794 Long term (current) use of insulin: Secondary | ICD-10-CM | POA: Diagnosis not present

## 2022-06-03 DIAGNOSIS — M109 Gout, unspecified: Secondary | ICD-10-CM | POA: Diagnosis not present

## 2022-06-03 DIAGNOSIS — E039 Hypothyroidism, unspecified: Secondary | ICD-10-CM | POA: Diagnosis not present

## 2022-06-03 DIAGNOSIS — M19042 Primary osteoarthritis, left hand: Secondary | ICD-10-CM | POA: Diagnosis not present

## 2022-06-03 DIAGNOSIS — M19041 Primary osteoarthritis, right hand: Secondary | ICD-10-CM | POA: Diagnosis not present

## 2022-06-03 DIAGNOSIS — F17210 Nicotine dependence, cigarettes, uncomplicated: Secondary | ICD-10-CM | POA: Diagnosis not present

## 2022-06-03 DIAGNOSIS — M545 Low back pain, unspecified: Secondary | ICD-10-CM | POA: Diagnosis not present

## 2022-06-03 DIAGNOSIS — G894 Chronic pain syndrome: Secondary | ICD-10-CM | POA: Diagnosis not present

## 2022-06-03 DIAGNOSIS — E7800 Pure hypercholesterolemia, unspecified: Secondary | ICD-10-CM | POA: Diagnosis not present

## 2022-06-03 DIAGNOSIS — E538 Deficiency of other specified B group vitamins: Secondary | ICD-10-CM | POA: Diagnosis not present

## 2022-06-03 DIAGNOSIS — Z9181 History of falling: Secondary | ICD-10-CM | POA: Diagnosis not present

## 2022-06-03 DIAGNOSIS — J449 Chronic obstructive pulmonary disease, unspecified: Secondary | ICD-10-CM | POA: Diagnosis not present

## 2022-06-03 DIAGNOSIS — M86671 Other chronic osteomyelitis, right ankle and foot: Secondary | ICD-10-CM | POA: Diagnosis not present

## 2022-06-03 DIAGNOSIS — E1142 Type 2 diabetes mellitus with diabetic polyneuropathy: Secondary | ICD-10-CM | POA: Diagnosis not present

## 2022-06-06 DIAGNOSIS — M19041 Primary osteoarthritis, right hand: Secondary | ICD-10-CM | POA: Diagnosis not present

## 2022-06-06 DIAGNOSIS — E1142 Type 2 diabetes mellitus with diabetic polyneuropathy: Secondary | ICD-10-CM | POA: Diagnosis not present

## 2022-06-06 DIAGNOSIS — N179 Acute kidney failure, unspecified: Secondary | ICD-10-CM | POA: Diagnosis not present

## 2022-06-06 DIAGNOSIS — B957 Other staphylococcus as the cause of diseases classified elsewhere: Secondary | ICD-10-CM | POA: Diagnosis not present

## 2022-06-06 DIAGNOSIS — E7800 Pure hypercholesterolemia, unspecified: Secondary | ICD-10-CM | POA: Diagnosis not present

## 2022-06-06 DIAGNOSIS — F17210 Nicotine dependence, cigarettes, uncomplicated: Secondary | ICD-10-CM | POA: Diagnosis not present

## 2022-06-06 DIAGNOSIS — E039 Hypothyroidism, unspecified: Secondary | ICD-10-CM | POA: Diagnosis not present

## 2022-06-06 DIAGNOSIS — M86671 Other chronic osteomyelitis, right ankle and foot: Secondary | ICD-10-CM | POA: Diagnosis not present

## 2022-06-06 DIAGNOSIS — E11621 Type 2 diabetes mellitus with foot ulcer: Secondary | ICD-10-CM | POA: Diagnosis not present

## 2022-06-06 DIAGNOSIS — J449 Chronic obstructive pulmonary disease, unspecified: Secondary | ICD-10-CM | POA: Diagnosis not present

## 2022-06-06 DIAGNOSIS — E1169 Type 2 diabetes mellitus with other specified complication: Secondary | ICD-10-CM | POA: Diagnosis not present

## 2022-06-06 DIAGNOSIS — G894 Chronic pain syndrome: Secondary | ICD-10-CM | POA: Diagnosis not present

## 2022-06-06 DIAGNOSIS — L97518 Non-pressure chronic ulcer of other part of right foot with other specified severity: Secondary | ICD-10-CM | POA: Diagnosis not present

## 2022-06-06 DIAGNOSIS — Z9181 History of falling: Secondary | ICD-10-CM | POA: Diagnosis not present

## 2022-06-06 DIAGNOSIS — M19042 Primary osteoarthritis, left hand: Secondary | ICD-10-CM | POA: Diagnosis not present

## 2022-06-06 DIAGNOSIS — M109 Gout, unspecified: Secondary | ICD-10-CM | POA: Diagnosis not present

## 2022-06-06 DIAGNOSIS — Z794 Long term (current) use of insulin: Secondary | ICD-10-CM | POA: Diagnosis not present

## 2022-06-06 DIAGNOSIS — Z981 Arthrodesis status: Secondary | ICD-10-CM | POA: Diagnosis not present

## 2022-06-06 DIAGNOSIS — E538 Deficiency of other specified B group vitamins: Secondary | ICD-10-CM | POA: Diagnosis not present

## 2022-06-06 DIAGNOSIS — G43909 Migraine, unspecified, not intractable, without status migrainosus: Secondary | ICD-10-CM | POA: Diagnosis not present

## 2022-06-06 DIAGNOSIS — E559 Vitamin D deficiency, unspecified: Secondary | ICD-10-CM | POA: Diagnosis not present

## 2022-06-06 DIAGNOSIS — I10 Essential (primary) hypertension: Secondary | ICD-10-CM | POA: Diagnosis not present

## 2022-06-06 DIAGNOSIS — K5909 Other constipation: Secondary | ICD-10-CM | POA: Diagnosis not present

## 2022-06-06 DIAGNOSIS — M545 Low back pain, unspecified: Secondary | ICD-10-CM | POA: Diagnosis not present

## 2022-06-07 DIAGNOSIS — R0902 Hypoxemia: Secondary | ICD-10-CM | POA: Diagnosis not present

## 2022-06-08 DIAGNOSIS — E1142 Type 2 diabetes mellitus with diabetic polyneuropathy: Secondary | ICD-10-CM | POA: Diagnosis not present

## 2022-06-08 DIAGNOSIS — Z9181 History of falling: Secondary | ICD-10-CM | POA: Diagnosis not present

## 2022-06-08 DIAGNOSIS — G894 Chronic pain syndrome: Secondary | ICD-10-CM | POA: Diagnosis not present

## 2022-06-08 DIAGNOSIS — L97518 Non-pressure chronic ulcer of other part of right foot with other specified severity: Secondary | ICD-10-CM | POA: Diagnosis not present

## 2022-06-08 DIAGNOSIS — E1165 Type 2 diabetes mellitus with hyperglycemia: Secondary | ICD-10-CM | POA: Diagnosis not present

## 2022-06-08 DIAGNOSIS — G43909 Migraine, unspecified, not intractable, without status migrainosus: Secondary | ICD-10-CM | POA: Diagnosis not present

## 2022-06-08 DIAGNOSIS — M19041 Primary osteoarthritis, right hand: Secondary | ICD-10-CM | POA: Diagnosis not present

## 2022-06-08 DIAGNOSIS — I10 Essential (primary) hypertension: Secondary | ICD-10-CM | POA: Diagnosis not present

## 2022-06-08 DIAGNOSIS — E538 Deficiency of other specified B group vitamins: Secondary | ICD-10-CM | POA: Diagnosis not present

## 2022-06-08 DIAGNOSIS — N179 Acute kidney failure, unspecified: Secondary | ICD-10-CM | POA: Diagnosis not present

## 2022-06-08 DIAGNOSIS — F17210 Nicotine dependence, cigarettes, uncomplicated: Secondary | ICD-10-CM | POA: Diagnosis not present

## 2022-06-08 DIAGNOSIS — M19042 Primary osteoarthritis, left hand: Secondary | ICD-10-CM | POA: Diagnosis not present

## 2022-06-08 DIAGNOSIS — M545 Low back pain, unspecified: Secondary | ICD-10-CM | POA: Diagnosis not present

## 2022-06-08 DIAGNOSIS — J449 Chronic obstructive pulmonary disease, unspecified: Secondary | ICD-10-CM | POA: Diagnosis not present

## 2022-06-08 DIAGNOSIS — E7800 Pure hypercholesterolemia, unspecified: Secondary | ICD-10-CM | POA: Diagnosis not present

## 2022-06-08 DIAGNOSIS — E1169 Type 2 diabetes mellitus with other specified complication: Secondary | ICD-10-CM | POA: Diagnosis not present

## 2022-06-08 DIAGNOSIS — M109 Gout, unspecified: Secondary | ICD-10-CM | POA: Diagnosis not present

## 2022-06-08 DIAGNOSIS — E039 Hypothyroidism, unspecified: Secondary | ICD-10-CM | POA: Diagnosis not present

## 2022-06-08 DIAGNOSIS — E11621 Type 2 diabetes mellitus with foot ulcer: Secondary | ICD-10-CM | POA: Diagnosis not present

## 2022-06-08 DIAGNOSIS — Z794 Long term (current) use of insulin: Secondary | ICD-10-CM | POA: Diagnosis not present

## 2022-06-08 DIAGNOSIS — M86671 Other chronic osteomyelitis, right ankle and foot: Secondary | ICD-10-CM | POA: Diagnosis not present

## 2022-06-08 DIAGNOSIS — B957 Other staphylococcus as the cause of diseases classified elsewhere: Secondary | ICD-10-CM | POA: Diagnosis not present

## 2022-06-08 DIAGNOSIS — Z981 Arthrodesis status: Secondary | ICD-10-CM | POA: Diagnosis not present

## 2022-06-08 DIAGNOSIS — E559 Vitamin D deficiency, unspecified: Secondary | ICD-10-CM | POA: Diagnosis not present

## 2022-06-08 DIAGNOSIS — K5909 Other constipation: Secondary | ICD-10-CM | POA: Diagnosis not present

## 2022-06-10 DIAGNOSIS — E7800 Pure hypercholesterolemia, unspecified: Secondary | ICD-10-CM | POA: Diagnosis not present

## 2022-06-10 DIAGNOSIS — K5909 Other constipation: Secondary | ICD-10-CM | POA: Diagnosis not present

## 2022-06-10 DIAGNOSIS — E538 Deficiency of other specified B group vitamins: Secondary | ICD-10-CM | POA: Diagnosis not present

## 2022-06-10 DIAGNOSIS — M19041 Primary osteoarthritis, right hand: Secondary | ICD-10-CM | POA: Diagnosis not present

## 2022-06-10 DIAGNOSIS — E11621 Type 2 diabetes mellitus with foot ulcer: Secondary | ICD-10-CM | POA: Diagnosis not present

## 2022-06-10 DIAGNOSIS — M86671 Other chronic osteomyelitis, right ankle and foot: Secondary | ICD-10-CM | POA: Diagnosis not present

## 2022-06-10 DIAGNOSIS — J449 Chronic obstructive pulmonary disease, unspecified: Secondary | ICD-10-CM | POA: Diagnosis not present

## 2022-06-10 DIAGNOSIS — M545 Low back pain, unspecified: Secondary | ICD-10-CM | POA: Diagnosis not present

## 2022-06-10 DIAGNOSIS — Z794 Long term (current) use of insulin: Secondary | ICD-10-CM | POA: Diagnosis not present

## 2022-06-10 DIAGNOSIS — Z981 Arthrodesis status: Secondary | ICD-10-CM | POA: Diagnosis not present

## 2022-06-10 DIAGNOSIS — B957 Other staphylococcus as the cause of diseases classified elsewhere: Secondary | ICD-10-CM | POA: Diagnosis not present

## 2022-06-10 DIAGNOSIS — G43909 Migraine, unspecified, not intractable, without status migrainosus: Secondary | ICD-10-CM | POA: Diagnosis not present

## 2022-06-10 DIAGNOSIS — F17210 Nicotine dependence, cigarettes, uncomplicated: Secondary | ICD-10-CM | POA: Diagnosis not present

## 2022-06-10 DIAGNOSIS — M19042 Primary osteoarthritis, left hand: Secondary | ICD-10-CM | POA: Diagnosis not present

## 2022-06-10 DIAGNOSIS — G894 Chronic pain syndrome: Secondary | ICD-10-CM | POA: Diagnosis not present

## 2022-06-10 DIAGNOSIS — N179 Acute kidney failure, unspecified: Secondary | ICD-10-CM | POA: Diagnosis not present

## 2022-06-10 DIAGNOSIS — E1142 Type 2 diabetes mellitus with diabetic polyneuropathy: Secondary | ICD-10-CM | POA: Diagnosis not present

## 2022-06-10 DIAGNOSIS — L97518 Non-pressure chronic ulcer of other part of right foot with other specified severity: Secondary | ICD-10-CM | POA: Diagnosis not present

## 2022-06-10 DIAGNOSIS — E1169 Type 2 diabetes mellitus with other specified complication: Secondary | ICD-10-CM | POA: Diagnosis not present

## 2022-06-10 DIAGNOSIS — E039 Hypothyroidism, unspecified: Secondary | ICD-10-CM | POA: Diagnosis not present

## 2022-06-10 DIAGNOSIS — I10 Essential (primary) hypertension: Secondary | ICD-10-CM | POA: Diagnosis not present

## 2022-06-10 DIAGNOSIS — M109 Gout, unspecified: Secondary | ICD-10-CM | POA: Diagnosis not present

## 2022-06-10 DIAGNOSIS — E559 Vitamin D deficiency, unspecified: Secondary | ICD-10-CM | POA: Diagnosis not present

## 2022-06-10 DIAGNOSIS — Z9181 History of falling: Secondary | ICD-10-CM | POA: Diagnosis not present

## 2022-06-13 DIAGNOSIS — K5909 Other constipation: Secondary | ICD-10-CM | POA: Diagnosis not present

## 2022-06-13 DIAGNOSIS — M545 Low back pain, unspecified: Secondary | ICD-10-CM | POA: Diagnosis not present

## 2022-06-13 DIAGNOSIS — E538 Deficiency of other specified B group vitamins: Secondary | ICD-10-CM | POA: Diagnosis not present

## 2022-06-13 DIAGNOSIS — E039 Hypothyroidism, unspecified: Secondary | ICD-10-CM | POA: Diagnosis not present

## 2022-06-13 DIAGNOSIS — M19042 Primary osteoarthritis, left hand: Secondary | ICD-10-CM | POA: Diagnosis not present

## 2022-06-13 DIAGNOSIS — M109 Gout, unspecified: Secondary | ICD-10-CM | POA: Diagnosis not present

## 2022-06-13 DIAGNOSIS — E1142 Type 2 diabetes mellitus with diabetic polyneuropathy: Secondary | ICD-10-CM | POA: Diagnosis not present

## 2022-06-13 DIAGNOSIS — Z981 Arthrodesis status: Secondary | ICD-10-CM | POA: Diagnosis not present

## 2022-06-13 DIAGNOSIS — E11621 Type 2 diabetes mellitus with foot ulcer: Secondary | ICD-10-CM | POA: Diagnosis not present

## 2022-06-13 DIAGNOSIS — M86671 Other chronic osteomyelitis, right ankle and foot: Secondary | ICD-10-CM | POA: Diagnosis not present

## 2022-06-13 DIAGNOSIS — M19041 Primary osteoarthritis, right hand: Secondary | ICD-10-CM | POA: Diagnosis not present

## 2022-06-13 DIAGNOSIS — I10 Essential (primary) hypertension: Secondary | ICD-10-CM | POA: Diagnosis not present

## 2022-06-13 DIAGNOSIS — F17210 Nicotine dependence, cigarettes, uncomplicated: Secondary | ICD-10-CM | POA: Diagnosis not present

## 2022-06-13 DIAGNOSIS — G43909 Migraine, unspecified, not intractable, without status migrainosus: Secondary | ICD-10-CM | POA: Diagnosis not present

## 2022-06-13 DIAGNOSIS — E7800 Pure hypercholesterolemia, unspecified: Secondary | ICD-10-CM | POA: Diagnosis not present

## 2022-06-13 DIAGNOSIS — G894 Chronic pain syndrome: Secondary | ICD-10-CM | POA: Diagnosis not present

## 2022-06-13 DIAGNOSIS — B957 Other staphylococcus as the cause of diseases classified elsewhere: Secondary | ICD-10-CM | POA: Diagnosis not present

## 2022-06-13 DIAGNOSIS — Z794 Long term (current) use of insulin: Secondary | ICD-10-CM | POA: Diagnosis not present

## 2022-06-13 DIAGNOSIS — N179 Acute kidney failure, unspecified: Secondary | ICD-10-CM | POA: Diagnosis not present

## 2022-06-13 DIAGNOSIS — E559 Vitamin D deficiency, unspecified: Secondary | ICD-10-CM | POA: Diagnosis not present

## 2022-06-13 DIAGNOSIS — E1169 Type 2 diabetes mellitus with other specified complication: Secondary | ICD-10-CM | POA: Diagnosis not present

## 2022-06-13 DIAGNOSIS — L97518 Non-pressure chronic ulcer of other part of right foot with other specified severity: Secondary | ICD-10-CM | POA: Diagnosis not present

## 2022-06-13 DIAGNOSIS — J449 Chronic obstructive pulmonary disease, unspecified: Secondary | ICD-10-CM | POA: Diagnosis not present

## 2022-06-13 DIAGNOSIS — Z9181 History of falling: Secondary | ICD-10-CM | POA: Diagnosis not present

## 2022-06-15 ENCOUNTER — Ambulatory Visit (INDEPENDENT_AMBULATORY_CARE_PROVIDER_SITE_OTHER): Payer: Medicare Other | Admitting: Podiatry

## 2022-06-15 ENCOUNTER — Encounter: Payer: Self-pay | Admitting: Podiatry

## 2022-06-15 DIAGNOSIS — E1142 Type 2 diabetes mellitus with diabetic polyneuropathy: Secondary | ICD-10-CM

## 2022-06-15 DIAGNOSIS — L97412 Non-pressure chronic ulcer of right heel and midfoot with fat layer exposed: Secondary | ICD-10-CM | POA: Diagnosis not present

## 2022-06-15 DIAGNOSIS — E11621 Type 2 diabetes mellitus with foot ulcer: Secondary | ICD-10-CM

## 2022-06-15 NOTE — Progress Notes (Signed)
Subjective:  Patient ID: Katherine Herrera, female    DOB: Jul 09, 1966,  MRN: [id number]  Chief Complaint  Patient presents with   Ulcer    DOS: 04/13/2022 Procedure: Right foot revisional I&D washout with Integra application/VAC  56 y.o. female returns for post-op check.  Patient states that she is doing okay.  The VAC is functioning well.  Granular wound bed noted.  The wound size is decreasing the wound VAC seems to be doing really good.  No acute complaints. Review of Systems: Negative except as noted in the HPI. Denies N/V/F/Ch.  Past Medical History:  Diagnosis Date   Anxiety    Arthritis    hands   B12 deficiency    Chronic constipation    Chronic pain syndrome    neck, lower back, and foot   COPD (chronic obstructive pulmonary disease) ([place])    Diabetic ulcer of right foot ([place])    Gout    possible   History of seizure    05-26-2020  per pt as child and last one in late teen's   History of sepsis    due to lower extremity cellulits 2019 left , right 03/ 2021   History of syncope    Hyperlipidemia    Hypothyroidism    Impaired range of motion of cervical spine    per pt hx cervical fusion C4 -- 7,  has to be careful about turning her head to the right can cause her to pass out   Insulin dependent type 2 diabetes mellitus [facility])    endocrinologist--- dr Chalmers Cater    (05-26-2020 per pt check's blood sugar twice dialy and at times a third time,  fasting am blood sugar's---- 200 -- 250)   LBP (low back pain)    DR Patrice Paradise   Migraine    Peripheral neuropathy    RBBB (right bundle branch block)    Tenosynovitis of foot and ankle 09/03/2013   TTS (tarsal tunnel syndrome) 2010   Left Foot [place] in [place]   Vitamin D deficiency    Wears glasses     Current Outpatient Medications:    B-D INS SYR ULTRAFINE 1CC/31G 31G X 5/16" 1 ML MISC, USE AS DIRECTED, Disp: 100 each, Rfl: 0   baclofen (LIORESAL) 10 MG tablet, TAKE 2 TABLETS BY MOUTH TWICE  DAILY, Disp: 180 tablet, Rfl: 1    bisacodyl (DULCOLAX) 5 MG EC tablet, Take 10 mg by mouth at bedtime., Disp: , Rfl:    Blood Glucose Monitoring Suppl (ONETOUCH VERIO) w/Device KIT, , Disp: , Rfl:    cholecalciferol (VITAMIN D3) 25 MCG (1000 UNIT) tablet, Take 1,000 Units by mouth daily., Disp: , Rfl:    colchicine 0.6 MG tablet, TAKE 2 TABLETS AS NEEDED GOUT ATTACK. THEN TAKE ANOTHER 1 TAB IN 1-2 HOURS. DONT REPEAT FOR 3 DAYS. (Patient taking differently: Take 0.6 mg by mouth daily as needed (gout).), Disp: 18 tablet, Rfl: 1   docusate sodium (COLACE) 100 MG capsule, Take 100 mg by mouth at bedtime., Disp: , Rfl:    ezetimibe (ZETIA) 10 MG tablet, TAKE 1 TABLET BY MOUTH DAILY, Disp: 100 tablet, Rfl: 2   FLUoxetine (PROZAC) 10 MG capsule, Take 1 capsule (10 mg total) by mouth daily., Disp: 90 capsule, Rfl: 3   gabapentin (NEURONTIN) 800 MG tablet, TAKE 1 TABLET BY MOUTH 4  TIMES DAILY (Patient taking differently: Take 800 mg by mouth 3 (three) times daily.), Disp: 360 tablet, Rfl: 3   insulin NPH-regular Human (70-30) 100  UNIT/ML injection, Inject 20 Units into the skin 2 (two) times daily with a meal. (Patient taking differently: Inject 30-40 Units into the skin See admin instructions. 30 units in the morning and afternoon and 40 units at night), Disp: 10 mL, Rfl: 11   lamoTRIgine (LAMICTAL) 25 MG tablet, TAKE 1 TABLET BY MOUTH  TWICE DAILY (Patient taking differently: Take 25 mg by mouth 2 (two) times daily.), Disp: 180 tablet, Rfl: 3   Lancet Devices (ACCU-CHEK SOFTCLIX) lancets, Test two times daily, Disp: 100 each, Rfl: 12   Lancets (ONETOUCH DELICA PLUS [id number]) MISC, , Disp: , Rfl:    levothyroxine (SYNTHROID) 75 MCG tablet, TAKE 1 TABLET BY MOUTH  DAILY (Patient taking differently: Take 75 mcg by mouth daily before breakfast.), Disp: 90 tablet, Rfl: 3   lisinopril (ZESTRIL) 10 MG tablet, TAKE 1 TABLET BY MOUTH DAILY, Disp: 100 tablet, Rfl: 2   loratadine (CLARITIN) 10 MG tablet, Take 1 tablet (10 mg total) by mouth daily as  needed for allergies., Disp: 100 tablet, Rfl: 3   ondansetron (ZOFRAN) 4 MG tablet, Take 4 mg by mouth every 8 (eight) hours as needed for nausea or vomiting., Disp: , Rfl:    oxyCODONE (ROXICODONE) 15 MG immediate release tablet, Take 1 tablet (15 mg total) by mouth every 6 (six) hours as needed for pain., Disp: 120 tablet, Rfl: 0   rOPINIRole (REQUIP) 0.5 MG tablet, TAKE 1 TABLET BY MOUTH AT  BEDTIME (Patient taking differently: Take 0.5 mg by mouth daily.), Disp: 90 tablet, Rfl: 3   silver sulfADIAZINE (SILVADENE) 1 % cream, APPLY PEA-SIZED AMOUNT TO WOUND DAILY., Disp: 50 g, Rfl: 0   vitamin B-12 (CYANOCOBALAMIN) 1000 MCG tablet, Take 1,000 mcg by mouth daily., Disp: , Rfl:    clonazePAM (KLONOPIN) 0.5 MG tablet, Take 1 tablet (0.5 mg total) by mouth 2 (two) times daily as needed for up to 5 days for anxiety., Disp: 60 tablet, Rfl: 1  Social History   Tobacco Use  Smoking Status Every Day   Packs/day: 1.00   Years: 10.00   Total pack years: 10.00   Types: Cigarettes  Smokeless Tobacco Never  Tobacco Comments   Trying to quit, smoking 1 ppd    Allergies  Allergen Reactions   Cephalexin Nausea And Vomiting   Demerol  [Meperidine Hcl] Rash   Lovastatin Other (See Comments)    Possible myalgia    Metformin And Related Nausea And Vomiting   Sulfamethoxazole-Trimethoprim     Other reaction(s): Unknown DILI, pancreatitis   Crestor [Rosuvastatin Calcium]     myalgia   Hydromorphone     Other reaction(s): Unknown   Niacin Other (See Comments)    Unknown    Other Other (See Comments)   Hydromorphone Hcl Itching    Patient has been tolerating Hydromorphone tablets without adverse effect (07/09/19) Other reaction(s): Unknown   Paroxetine Other (See Comments)    Unknown    Objective:  There were no vitals filed for this visit. There is no height or weight on file to calculate BMI. Constitutional Well developed. Well nourished.  Vascular Foot warm and well perfused. Capillary  refill normal to all digits.   Neurologic Normal speech. Oriented to person, place, and time. Epicritic sensation to light touch grossly present bilaterally.  Dermatologic Granular wound bed noted to the right foot.  Debridement was carried out minimally.  No purulent drainage noted slight malodor present likely due to maceration  Orthopedic: Tenderness to palpation noted about the surgical site.  Radiographs: None Assessment:   No diagnosis found.   Plan:  Patient was evaluated and treated and all questions answered.  S/p foot surgery right -Progressing as expected post-operatively. -XR: See above -WB Status: Partial weightbearing to the heel/nonweightbearing in walker -Granular wound bed noted.  Wound size is decreasing. -Medications: None -Reapply the VAC on Friday.  Continue Betadine wet-to-dry dressing until the -Minimal debridement was carried out in standard technique.  No complication noted.  No follow-ups on file. Heling wel granular tissue

## 2022-06-17 DIAGNOSIS — Z9181 History of falling: Secondary | ICD-10-CM | POA: Diagnosis not present

## 2022-06-17 DIAGNOSIS — L97518 Non-pressure chronic ulcer of other part of right foot with other specified severity: Secondary | ICD-10-CM | POA: Diagnosis not present

## 2022-06-17 DIAGNOSIS — N179 Acute kidney failure, unspecified: Secondary | ICD-10-CM | POA: Diagnosis not present

## 2022-06-17 DIAGNOSIS — E1169 Type 2 diabetes mellitus with other specified complication: Secondary | ICD-10-CM | POA: Diagnosis not present

## 2022-06-17 DIAGNOSIS — K5909 Other constipation: Secondary | ICD-10-CM | POA: Diagnosis not present

## 2022-06-17 DIAGNOSIS — E11621 Type 2 diabetes mellitus with foot ulcer: Secondary | ICD-10-CM | POA: Diagnosis not present

## 2022-06-17 DIAGNOSIS — Z794 Long term (current) use of insulin: Secondary | ICD-10-CM | POA: Diagnosis not present

## 2022-06-17 DIAGNOSIS — G894 Chronic pain syndrome: Secondary | ICD-10-CM | POA: Diagnosis not present

## 2022-06-17 DIAGNOSIS — M86671 Other chronic osteomyelitis, right ankle and foot: Secondary | ICD-10-CM | POA: Diagnosis not present

## 2022-06-17 DIAGNOSIS — J449 Chronic obstructive pulmonary disease, unspecified: Secondary | ICD-10-CM | POA: Diagnosis not present

## 2022-06-17 DIAGNOSIS — M19042 Primary osteoarthritis, left hand: Secondary | ICD-10-CM | POA: Diagnosis not present

## 2022-06-17 DIAGNOSIS — M545 Low back pain, unspecified: Secondary | ICD-10-CM | POA: Diagnosis not present

## 2022-06-17 DIAGNOSIS — G43909 Migraine, unspecified, not intractable, without status migrainosus: Secondary | ICD-10-CM | POA: Diagnosis not present

## 2022-06-17 DIAGNOSIS — E538 Deficiency of other specified B group vitamins: Secondary | ICD-10-CM | POA: Diagnosis not present

## 2022-06-17 DIAGNOSIS — B957 Other staphylococcus as the cause of diseases classified elsewhere: Secondary | ICD-10-CM | POA: Diagnosis not present

## 2022-06-17 DIAGNOSIS — F17210 Nicotine dependence, cigarettes, uncomplicated: Secondary | ICD-10-CM | POA: Diagnosis not present

## 2022-06-17 DIAGNOSIS — E1142 Type 2 diabetes mellitus with diabetic polyneuropathy: Secondary | ICD-10-CM | POA: Diagnosis not present

## 2022-06-17 DIAGNOSIS — M109 Gout, unspecified: Secondary | ICD-10-CM | POA: Diagnosis not present

## 2022-06-17 DIAGNOSIS — I10 Essential (primary) hypertension: Secondary | ICD-10-CM | POA: Diagnosis not present

## 2022-06-17 DIAGNOSIS — Z981 Arthrodesis status: Secondary | ICD-10-CM | POA: Diagnosis not present

## 2022-06-17 DIAGNOSIS — E039 Hypothyroidism, unspecified: Secondary | ICD-10-CM | POA: Diagnosis not present

## 2022-06-17 DIAGNOSIS — E7800 Pure hypercholesterolemia, unspecified: Secondary | ICD-10-CM | POA: Diagnosis not present

## 2022-06-17 DIAGNOSIS — M19041 Primary osteoarthritis, right hand: Secondary | ICD-10-CM | POA: Diagnosis not present

## 2022-06-17 DIAGNOSIS — E559 Vitamin D deficiency, unspecified: Secondary | ICD-10-CM | POA: Diagnosis not present

## 2022-06-20 DIAGNOSIS — L97518 Non-pressure chronic ulcer of other part of right foot with other specified severity: Secondary | ICD-10-CM | POA: Diagnosis not present

## 2022-06-20 DIAGNOSIS — M86671 Other chronic osteomyelitis, right ankle and foot: Secondary | ICD-10-CM | POA: Diagnosis not present

## 2022-06-20 DIAGNOSIS — Z981 Arthrodesis status: Secondary | ICD-10-CM | POA: Diagnosis not present

## 2022-06-20 DIAGNOSIS — M545 Low back pain, unspecified: Secondary | ICD-10-CM | POA: Diagnosis not present

## 2022-06-20 DIAGNOSIS — Z794 Long term (current) use of insulin: Secondary | ICD-10-CM | POA: Diagnosis not present

## 2022-06-20 DIAGNOSIS — K5909 Other constipation: Secondary | ICD-10-CM | POA: Diagnosis not present

## 2022-06-20 DIAGNOSIS — E7800 Pure hypercholesterolemia, unspecified: Secondary | ICD-10-CM | POA: Diagnosis not present

## 2022-06-20 DIAGNOSIS — G43909 Migraine, unspecified, not intractable, without status migrainosus: Secondary | ICD-10-CM | POA: Diagnosis not present

## 2022-06-20 DIAGNOSIS — Z9181 History of falling: Secondary | ICD-10-CM | POA: Diagnosis not present

## 2022-06-20 DIAGNOSIS — B957 Other staphylococcus as the cause of diseases classified elsewhere: Secondary | ICD-10-CM | POA: Diagnosis not present

## 2022-06-20 DIAGNOSIS — M109 Gout, unspecified: Secondary | ICD-10-CM | POA: Diagnosis not present

## 2022-06-20 DIAGNOSIS — J449 Chronic obstructive pulmonary disease, unspecified: Secondary | ICD-10-CM | POA: Diagnosis not present

## 2022-06-20 DIAGNOSIS — G894 Chronic pain syndrome: Secondary | ICD-10-CM | POA: Diagnosis not present

## 2022-06-20 DIAGNOSIS — E039 Hypothyroidism, unspecified: Secondary | ICD-10-CM | POA: Diagnosis not present

## 2022-06-20 DIAGNOSIS — E538 Deficiency of other specified B group vitamins: Secondary | ICD-10-CM | POA: Diagnosis not present

## 2022-06-20 DIAGNOSIS — E11621 Type 2 diabetes mellitus with foot ulcer: Secondary | ICD-10-CM | POA: Diagnosis not present

## 2022-06-20 DIAGNOSIS — N179 Acute kidney failure, unspecified: Secondary | ICD-10-CM | POA: Diagnosis not present

## 2022-06-20 DIAGNOSIS — F17210 Nicotine dependence, cigarettes, uncomplicated: Secondary | ICD-10-CM | POA: Diagnosis not present

## 2022-06-20 DIAGNOSIS — E559 Vitamin D deficiency, unspecified: Secondary | ICD-10-CM | POA: Diagnosis not present

## 2022-06-20 DIAGNOSIS — M19041 Primary osteoarthritis, right hand: Secondary | ICD-10-CM | POA: Diagnosis not present

## 2022-06-20 DIAGNOSIS — I10 Essential (primary) hypertension: Secondary | ICD-10-CM | POA: Diagnosis not present

## 2022-06-20 DIAGNOSIS — E1142 Type 2 diabetes mellitus with diabetic polyneuropathy: Secondary | ICD-10-CM | POA: Diagnosis not present

## 2022-06-20 DIAGNOSIS — E1169 Type 2 diabetes mellitus with other specified complication: Secondary | ICD-10-CM | POA: Diagnosis not present

## 2022-06-20 DIAGNOSIS — M19042 Primary osteoarthritis, left hand: Secondary | ICD-10-CM | POA: Diagnosis not present

## 2022-06-22 ENCOUNTER — Other Ambulatory Visit: Payer: Self-pay | Admitting: Internal Medicine

## 2022-06-22 DIAGNOSIS — M19041 Primary osteoarthritis, right hand: Secondary | ICD-10-CM | POA: Diagnosis not present

## 2022-06-22 DIAGNOSIS — I10 Essential (primary) hypertension: Secondary | ICD-10-CM | POA: Diagnosis not present

## 2022-06-22 DIAGNOSIS — G894 Chronic pain syndrome: Secondary | ICD-10-CM | POA: Diagnosis not present

## 2022-06-22 DIAGNOSIS — K5909 Other constipation: Secondary | ICD-10-CM | POA: Diagnosis not present

## 2022-06-22 DIAGNOSIS — Z9181 History of falling: Secondary | ICD-10-CM | POA: Diagnosis not present

## 2022-06-22 DIAGNOSIS — E1142 Type 2 diabetes mellitus with diabetic polyneuropathy: Secondary | ICD-10-CM | POA: Diagnosis not present

## 2022-06-22 DIAGNOSIS — B957 Other staphylococcus as the cause of diseases classified elsewhere: Secondary | ICD-10-CM | POA: Diagnosis not present

## 2022-06-22 DIAGNOSIS — N179 Acute kidney failure, unspecified: Secondary | ICD-10-CM | POA: Diagnosis not present

## 2022-06-22 DIAGNOSIS — E538 Deficiency of other specified B group vitamins: Secondary | ICD-10-CM | POA: Diagnosis not present

## 2022-06-22 DIAGNOSIS — E039 Hypothyroidism, unspecified: Secondary | ICD-10-CM | POA: Diagnosis not present

## 2022-06-22 DIAGNOSIS — E7800 Pure hypercholesterolemia, unspecified: Secondary | ICD-10-CM | POA: Diagnosis not present

## 2022-06-22 DIAGNOSIS — M109 Gout, unspecified: Secondary | ICD-10-CM | POA: Diagnosis not present

## 2022-06-22 DIAGNOSIS — Z981 Arthrodesis status: Secondary | ICD-10-CM | POA: Diagnosis not present

## 2022-06-22 DIAGNOSIS — M545 Low back pain, unspecified: Secondary | ICD-10-CM | POA: Diagnosis not present

## 2022-06-22 DIAGNOSIS — L97518 Non-pressure chronic ulcer of other part of right foot with other specified severity: Secondary | ICD-10-CM | POA: Diagnosis not present

## 2022-06-22 DIAGNOSIS — J449 Chronic obstructive pulmonary disease, unspecified: Secondary | ICD-10-CM | POA: Diagnosis not present

## 2022-06-22 DIAGNOSIS — E11621 Type 2 diabetes mellitus with foot ulcer: Secondary | ICD-10-CM | POA: Diagnosis not present

## 2022-06-22 DIAGNOSIS — E559 Vitamin D deficiency, unspecified: Secondary | ICD-10-CM | POA: Diagnosis not present

## 2022-06-22 DIAGNOSIS — Z794 Long term (current) use of insulin: Secondary | ICD-10-CM | POA: Diagnosis not present

## 2022-06-22 DIAGNOSIS — G43909 Migraine, unspecified, not intractable, without status migrainosus: Secondary | ICD-10-CM | POA: Diagnosis not present

## 2022-06-22 DIAGNOSIS — M86671 Other chronic osteomyelitis, right ankle and foot: Secondary | ICD-10-CM | POA: Diagnosis not present

## 2022-06-22 DIAGNOSIS — E1169 Type 2 diabetes mellitus with other specified complication: Secondary | ICD-10-CM | POA: Diagnosis not present

## 2022-06-22 DIAGNOSIS — M19042 Primary osteoarthritis, left hand: Secondary | ICD-10-CM | POA: Diagnosis not present

## 2022-06-22 DIAGNOSIS — F17210 Nicotine dependence, cigarettes, uncomplicated: Secondary | ICD-10-CM | POA: Diagnosis not present

## 2022-06-24 DIAGNOSIS — J449 Chronic obstructive pulmonary disease, unspecified: Secondary | ICD-10-CM | POA: Diagnosis not present

## 2022-06-24 DIAGNOSIS — M19041 Primary osteoarthritis, right hand: Secondary | ICD-10-CM | POA: Diagnosis not present

## 2022-06-24 DIAGNOSIS — M109 Gout, unspecified: Secondary | ICD-10-CM | POA: Diagnosis not present

## 2022-06-24 DIAGNOSIS — N179 Acute kidney failure, unspecified: Secondary | ICD-10-CM | POA: Diagnosis not present

## 2022-06-24 DIAGNOSIS — G43909 Migraine, unspecified, not intractable, without status migrainosus: Secondary | ICD-10-CM | POA: Diagnosis not present

## 2022-06-24 DIAGNOSIS — E1142 Type 2 diabetes mellitus with diabetic polyneuropathy: Secondary | ICD-10-CM | POA: Diagnosis not present

## 2022-06-24 DIAGNOSIS — I10 Essential (primary) hypertension: Secondary | ICD-10-CM | POA: Diagnosis not present

## 2022-06-24 DIAGNOSIS — F17210 Nicotine dependence, cigarettes, uncomplicated: Secondary | ICD-10-CM | POA: Diagnosis not present

## 2022-06-24 DIAGNOSIS — Z794 Long term (current) use of insulin: Secondary | ICD-10-CM | POA: Diagnosis not present

## 2022-06-24 DIAGNOSIS — E11621 Type 2 diabetes mellitus with foot ulcer: Secondary | ICD-10-CM | POA: Diagnosis not present

## 2022-06-24 DIAGNOSIS — B957 Other staphylococcus as the cause of diseases classified elsewhere: Secondary | ICD-10-CM | POA: Diagnosis not present

## 2022-06-24 DIAGNOSIS — M545 Low back pain, unspecified: Secondary | ICD-10-CM | POA: Diagnosis not present

## 2022-06-24 DIAGNOSIS — E039 Hypothyroidism, unspecified: Secondary | ICD-10-CM | POA: Diagnosis not present

## 2022-06-24 DIAGNOSIS — E559 Vitamin D deficiency, unspecified: Secondary | ICD-10-CM | POA: Diagnosis not present

## 2022-06-24 DIAGNOSIS — Z9181 History of falling: Secondary | ICD-10-CM | POA: Diagnosis not present

## 2022-06-24 DIAGNOSIS — M86671 Other chronic osteomyelitis, right ankle and foot: Secondary | ICD-10-CM | POA: Diagnosis not present

## 2022-06-24 DIAGNOSIS — L97518 Non-pressure chronic ulcer of other part of right foot with other specified severity: Secondary | ICD-10-CM | POA: Diagnosis not present

## 2022-06-24 DIAGNOSIS — E1169 Type 2 diabetes mellitus with other specified complication: Secondary | ICD-10-CM | POA: Diagnosis not present

## 2022-06-24 DIAGNOSIS — E7800 Pure hypercholesterolemia, unspecified: Secondary | ICD-10-CM | POA: Diagnosis not present

## 2022-06-24 DIAGNOSIS — K5909 Other constipation: Secondary | ICD-10-CM | POA: Diagnosis not present

## 2022-06-24 DIAGNOSIS — Z981 Arthrodesis status: Secondary | ICD-10-CM | POA: Diagnosis not present

## 2022-06-24 DIAGNOSIS — M19042 Primary osteoarthritis, left hand: Secondary | ICD-10-CM | POA: Diagnosis not present

## 2022-06-24 DIAGNOSIS — E538 Deficiency of other specified B group vitamins: Secondary | ICD-10-CM | POA: Diagnosis not present

## 2022-06-24 DIAGNOSIS — G894 Chronic pain syndrome: Secondary | ICD-10-CM | POA: Diagnosis not present

## 2022-06-27 DIAGNOSIS — G894 Chronic pain syndrome: Secondary | ICD-10-CM | POA: Diagnosis not present

## 2022-06-27 DIAGNOSIS — K5909 Other constipation: Secondary | ICD-10-CM | POA: Diagnosis not present

## 2022-06-27 DIAGNOSIS — M19042 Primary osteoarthritis, left hand: Secondary | ICD-10-CM | POA: Diagnosis not present

## 2022-06-27 DIAGNOSIS — N179 Acute kidney failure, unspecified: Secondary | ICD-10-CM | POA: Diagnosis not present

## 2022-06-27 DIAGNOSIS — Z981 Arthrodesis status: Secondary | ICD-10-CM | POA: Diagnosis not present

## 2022-06-27 DIAGNOSIS — M109 Gout, unspecified: Secondary | ICD-10-CM | POA: Diagnosis not present

## 2022-06-27 DIAGNOSIS — B957 Other staphylococcus as the cause of diseases classified elsewhere: Secondary | ICD-10-CM | POA: Diagnosis not present

## 2022-06-27 DIAGNOSIS — E559 Vitamin D deficiency, unspecified: Secondary | ICD-10-CM | POA: Diagnosis not present

## 2022-06-27 DIAGNOSIS — Z9181 History of falling: Secondary | ICD-10-CM | POA: Diagnosis not present

## 2022-06-27 DIAGNOSIS — E11621 Type 2 diabetes mellitus with foot ulcer: Secondary | ICD-10-CM | POA: Diagnosis not present

## 2022-06-27 DIAGNOSIS — L97518 Non-pressure chronic ulcer of other part of right foot with other specified severity: Secondary | ICD-10-CM | POA: Diagnosis not present

## 2022-06-27 DIAGNOSIS — G43909 Migraine, unspecified, not intractable, without status migrainosus: Secondary | ICD-10-CM | POA: Diagnosis not present

## 2022-06-27 DIAGNOSIS — E039 Hypothyroidism, unspecified: Secondary | ICD-10-CM | POA: Diagnosis not present

## 2022-06-27 DIAGNOSIS — E538 Deficiency of other specified B group vitamins: Secondary | ICD-10-CM | POA: Diagnosis not present

## 2022-06-27 DIAGNOSIS — I10 Essential (primary) hypertension: Secondary | ICD-10-CM | POA: Diagnosis not present

## 2022-06-27 DIAGNOSIS — E7800 Pure hypercholesterolemia, unspecified: Secondary | ICD-10-CM | POA: Diagnosis not present

## 2022-06-27 DIAGNOSIS — F17210 Nicotine dependence, cigarettes, uncomplicated: Secondary | ICD-10-CM | POA: Diagnosis not present

## 2022-06-27 DIAGNOSIS — Z794 Long term (current) use of insulin: Secondary | ICD-10-CM | POA: Diagnosis not present

## 2022-06-27 DIAGNOSIS — M19041 Primary osteoarthritis, right hand: Secondary | ICD-10-CM | POA: Diagnosis not present

## 2022-06-27 DIAGNOSIS — M86671 Other chronic osteomyelitis, right ankle and foot: Secondary | ICD-10-CM | POA: Diagnosis not present

## 2022-06-27 DIAGNOSIS — M545 Low back pain, unspecified: Secondary | ICD-10-CM | POA: Diagnosis not present

## 2022-06-27 DIAGNOSIS — E1142 Type 2 diabetes mellitus with diabetic polyneuropathy: Secondary | ICD-10-CM | POA: Diagnosis not present

## 2022-06-27 DIAGNOSIS — E1169 Type 2 diabetes mellitus with other specified complication: Secondary | ICD-10-CM | POA: Diagnosis not present

## 2022-06-27 DIAGNOSIS — J449 Chronic obstructive pulmonary disease, unspecified: Secondary | ICD-10-CM | POA: Diagnosis not present

## 2022-06-28 DIAGNOSIS — M86671 Other chronic osteomyelitis, right ankle and foot: Secondary | ICD-10-CM | POA: Diagnosis not present

## 2022-06-29 DIAGNOSIS — Z9181 History of falling: Secondary | ICD-10-CM | POA: Diagnosis not present

## 2022-06-29 DIAGNOSIS — M545 Low back pain, unspecified: Secondary | ICD-10-CM | POA: Diagnosis not present

## 2022-06-29 DIAGNOSIS — K5909 Other constipation: Secondary | ICD-10-CM | POA: Diagnosis not present

## 2022-06-29 DIAGNOSIS — E1169 Type 2 diabetes mellitus with other specified complication: Secondary | ICD-10-CM | POA: Diagnosis not present

## 2022-06-29 DIAGNOSIS — G43909 Migraine, unspecified, not intractable, without status migrainosus: Secondary | ICD-10-CM | POA: Diagnosis not present

## 2022-06-29 DIAGNOSIS — E039 Hypothyroidism, unspecified: Secondary | ICD-10-CM | POA: Diagnosis not present

## 2022-06-29 DIAGNOSIS — E538 Deficiency of other specified B group vitamins: Secondary | ICD-10-CM | POA: Diagnosis not present

## 2022-06-29 DIAGNOSIS — E11621 Type 2 diabetes mellitus with foot ulcer: Secondary | ICD-10-CM | POA: Diagnosis not present

## 2022-06-29 DIAGNOSIS — Z794 Long term (current) use of insulin: Secondary | ICD-10-CM | POA: Diagnosis not present

## 2022-06-29 DIAGNOSIS — J449 Chronic obstructive pulmonary disease, unspecified: Secondary | ICD-10-CM | POA: Diagnosis not present

## 2022-06-29 DIAGNOSIS — Z981 Arthrodesis status: Secondary | ICD-10-CM | POA: Diagnosis not present

## 2022-06-29 DIAGNOSIS — I10 Essential (primary) hypertension: Secondary | ICD-10-CM | POA: Diagnosis not present

## 2022-06-29 DIAGNOSIS — G894 Chronic pain syndrome: Secondary | ICD-10-CM | POA: Diagnosis not present

## 2022-06-29 DIAGNOSIS — N179 Acute kidney failure, unspecified: Secondary | ICD-10-CM | POA: Diagnosis not present

## 2022-06-29 DIAGNOSIS — E7800 Pure hypercholesterolemia, unspecified: Secondary | ICD-10-CM | POA: Diagnosis not present

## 2022-06-29 DIAGNOSIS — M86671 Other chronic osteomyelitis, right ankle and foot: Secondary | ICD-10-CM | POA: Diagnosis not present

## 2022-06-29 DIAGNOSIS — L97518 Non-pressure chronic ulcer of other part of right foot with other specified severity: Secondary | ICD-10-CM | POA: Diagnosis not present

## 2022-06-29 DIAGNOSIS — E1142 Type 2 diabetes mellitus with diabetic polyneuropathy: Secondary | ICD-10-CM | POA: Diagnosis not present

## 2022-06-29 DIAGNOSIS — B957 Other staphylococcus as the cause of diseases classified elsewhere: Secondary | ICD-10-CM | POA: Diagnosis not present

## 2022-06-29 DIAGNOSIS — E559 Vitamin D deficiency, unspecified: Secondary | ICD-10-CM | POA: Diagnosis not present

## 2022-06-29 DIAGNOSIS — M109 Gout, unspecified: Secondary | ICD-10-CM | POA: Diagnosis not present

## 2022-06-29 DIAGNOSIS — M19041 Primary osteoarthritis, right hand: Secondary | ICD-10-CM | POA: Diagnosis not present

## 2022-06-29 DIAGNOSIS — M19042 Primary osteoarthritis, left hand: Secondary | ICD-10-CM | POA: Diagnosis not present

## 2022-06-29 DIAGNOSIS — F17210 Nicotine dependence, cigarettes, uncomplicated: Secondary | ICD-10-CM | POA: Diagnosis not present

## 2022-07-01 DIAGNOSIS — M86671 Other chronic osteomyelitis, right ankle and foot: Secondary | ICD-10-CM | POA: Diagnosis not present

## 2022-07-01 DIAGNOSIS — M19041 Primary osteoarthritis, right hand: Secondary | ICD-10-CM | POA: Diagnosis not present

## 2022-07-01 DIAGNOSIS — E559 Vitamin D deficiency, unspecified: Secondary | ICD-10-CM | POA: Diagnosis not present

## 2022-07-01 DIAGNOSIS — Z981 Arthrodesis status: Secondary | ICD-10-CM | POA: Diagnosis not present

## 2022-07-01 DIAGNOSIS — G43909 Migraine, unspecified, not intractable, without status migrainosus: Secondary | ICD-10-CM | POA: Diagnosis not present

## 2022-07-01 DIAGNOSIS — I10 Essential (primary) hypertension: Secondary | ICD-10-CM | POA: Diagnosis not present

## 2022-07-01 DIAGNOSIS — Z794 Long term (current) use of insulin: Secondary | ICD-10-CM | POA: Diagnosis not present

## 2022-07-01 DIAGNOSIS — L97518 Non-pressure chronic ulcer of other part of right foot with other specified severity: Secondary | ICD-10-CM | POA: Diagnosis not present

## 2022-07-01 DIAGNOSIS — E7800 Pure hypercholesterolemia, unspecified: Secondary | ICD-10-CM | POA: Diagnosis not present

## 2022-07-01 DIAGNOSIS — E1169 Type 2 diabetes mellitus with other specified complication: Secondary | ICD-10-CM | POA: Diagnosis not present

## 2022-07-01 DIAGNOSIS — F17210 Nicotine dependence, cigarettes, uncomplicated: Secondary | ICD-10-CM | POA: Diagnosis not present

## 2022-07-01 DIAGNOSIS — B957 Other staphylococcus as the cause of diseases classified elsewhere: Secondary | ICD-10-CM | POA: Diagnosis not present

## 2022-07-01 DIAGNOSIS — N179 Acute kidney failure, unspecified: Secondary | ICD-10-CM | POA: Diagnosis not present

## 2022-07-01 DIAGNOSIS — E1142 Type 2 diabetes mellitus with diabetic polyneuropathy: Secondary | ICD-10-CM | POA: Diagnosis not present

## 2022-07-01 DIAGNOSIS — G894 Chronic pain syndrome: Secondary | ICD-10-CM | POA: Diagnosis not present

## 2022-07-01 DIAGNOSIS — M545 Low back pain, unspecified: Secondary | ICD-10-CM | POA: Diagnosis not present

## 2022-07-01 DIAGNOSIS — E538 Deficiency of other specified B group vitamins: Secondary | ICD-10-CM | POA: Diagnosis not present

## 2022-07-01 DIAGNOSIS — M109 Gout, unspecified: Secondary | ICD-10-CM | POA: Diagnosis not present

## 2022-07-01 DIAGNOSIS — E11621 Type 2 diabetes mellitus with foot ulcer: Secondary | ICD-10-CM | POA: Diagnosis not present

## 2022-07-01 DIAGNOSIS — M19042 Primary osteoarthritis, left hand: Secondary | ICD-10-CM | POA: Diagnosis not present

## 2022-07-01 DIAGNOSIS — J449 Chronic obstructive pulmonary disease, unspecified: Secondary | ICD-10-CM | POA: Diagnosis not present

## 2022-07-01 DIAGNOSIS — E039 Hypothyroidism, unspecified: Secondary | ICD-10-CM | POA: Diagnosis not present

## 2022-07-01 DIAGNOSIS — K5909 Other constipation: Secondary | ICD-10-CM | POA: Diagnosis not present

## 2022-07-01 DIAGNOSIS — Z9181 History of falling: Secondary | ICD-10-CM | POA: Diagnosis not present

## 2022-07-04 DIAGNOSIS — J449 Chronic obstructive pulmonary disease, unspecified: Secondary | ICD-10-CM | POA: Diagnosis not present

## 2022-07-04 DIAGNOSIS — Z981 Arthrodesis status: Secondary | ICD-10-CM | POA: Diagnosis not present

## 2022-07-04 DIAGNOSIS — I10 Essential (primary) hypertension: Secondary | ICD-10-CM | POA: Diagnosis not present

## 2022-07-04 DIAGNOSIS — N179 Acute kidney failure, unspecified: Secondary | ICD-10-CM | POA: Diagnosis not present

## 2022-07-04 DIAGNOSIS — E039 Hypothyroidism, unspecified: Secondary | ICD-10-CM | POA: Diagnosis not present

## 2022-07-04 DIAGNOSIS — M545 Low back pain, unspecified: Secondary | ICD-10-CM | POA: Diagnosis not present

## 2022-07-04 DIAGNOSIS — L97518 Non-pressure chronic ulcer of other part of right foot with other specified severity: Secondary | ICD-10-CM | POA: Diagnosis not present

## 2022-07-04 DIAGNOSIS — M109 Gout, unspecified: Secondary | ICD-10-CM | POA: Diagnosis not present

## 2022-07-04 DIAGNOSIS — Z794 Long term (current) use of insulin: Secondary | ICD-10-CM | POA: Diagnosis not present

## 2022-07-04 DIAGNOSIS — B957 Other staphylococcus as the cause of diseases classified elsewhere: Secondary | ICD-10-CM | POA: Diagnosis not present

## 2022-07-04 DIAGNOSIS — E11621 Type 2 diabetes mellitus with foot ulcer: Secondary | ICD-10-CM | POA: Diagnosis not present

## 2022-07-04 DIAGNOSIS — E538 Deficiency of other specified B group vitamins: Secondary | ICD-10-CM | POA: Diagnosis not present

## 2022-07-04 DIAGNOSIS — E7800 Pure hypercholesterolemia, unspecified: Secondary | ICD-10-CM | POA: Diagnosis not present

## 2022-07-04 DIAGNOSIS — M19042 Primary osteoarthritis, left hand: Secondary | ICD-10-CM | POA: Diagnosis not present

## 2022-07-04 DIAGNOSIS — E559 Vitamin D deficiency, unspecified: Secondary | ICD-10-CM | POA: Diagnosis not present

## 2022-07-04 DIAGNOSIS — E1169 Type 2 diabetes mellitus with other specified complication: Secondary | ICD-10-CM | POA: Diagnosis not present

## 2022-07-04 DIAGNOSIS — G43909 Migraine, unspecified, not intractable, without status migrainosus: Secondary | ICD-10-CM | POA: Diagnosis not present

## 2022-07-04 DIAGNOSIS — G894 Chronic pain syndrome: Secondary | ICD-10-CM | POA: Diagnosis not present

## 2022-07-04 DIAGNOSIS — M86671 Other chronic osteomyelitis, right ankle and foot: Secondary | ICD-10-CM | POA: Diagnosis not present

## 2022-07-04 DIAGNOSIS — F17210 Nicotine dependence, cigarettes, uncomplicated: Secondary | ICD-10-CM | POA: Diagnosis not present

## 2022-07-04 DIAGNOSIS — M19041 Primary osteoarthritis, right hand: Secondary | ICD-10-CM | POA: Diagnosis not present

## 2022-07-04 DIAGNOSIS — Z9181 History of falling: Secondary | ICD-10-CM | POA: Diagnosis not present

## 2022-07-04 DIAGNOSIS — K5909 Other constipation: Secondary | ICD-10-CM | POA: Diagnosis not present

## 2022-07-04 DIAGNOSIS — E1142 Type 2 diabetes mellitus with diabetic polyneuropathy: Secondary | ICD-10-CM | POA: Diagnosis not present

## 2022-07-06 DIAGNOSIS — M545 Low back pain, unspecified: Secondary | ICD-10-CM | POA: Diagnosis not present

## 2022-07-06 DIAGNOSIS — B957 Other staphylococcus as the cause of diseases classified elsewhere: Secondary | ICD-10-CM | POA: Diagnosis not present

## 2022-07-06 DIAGNOSIS — M19042 Primary osteoarthritis, left hand: Secondary | ICD-10-CM | POA: Diagnosis not present

## 2022-07-06 DIAGNOSIS — E559 Vitamin D deficiency, unspecified: Secondary | ICD-10-CM | POA: Diagnosis not present

## 2022-07-06 DIAGNOSIS — E1142 Type 2 diabetes mellitus with diabetic polyneuropathy: Secondary | ICD-10-CM | POA: Diagnosis not present

## 2022-07-06 DIAGNOSIS — Z981 Arthrodesis status: Secondary | ICD-10-CM | POA: Diagnosis not present

## 2022-07-06 DIAGNOSIS — E1169 Type 2 diabetes mellitus with other specified complication: Secondary | ICD-10-CM | POA: Diagnosis not present

## 2022-07-06 DIAGNOSIS — M86671 Other chronic osteomyelitis, right ankle and foot: Secondary | ICD-10-CM | POA: Diagnosis not present

## 2022-07-06 DIAGNOSIS — Z794 Long term (current) use of insulin: Secondary | ICD-10-CM | POA: Diagnosis not present

## 2022-07-06 DIAGNOSIS — G894 Chronic pain syndrome: Secondary | ICD-10-CM | POA: Diagnosis not present

## 2022-07-06 DIAGNOSIS — E11621 Type 2 diabetes mellitus with foot ulcer: Secondary | ICD-10-CM | POA: Diagnosis not present

## 2022-07-06 DIAGNOSIS — E039 Hypothyroidism, unspecified: Secondary | ICD-10-CM | POA: Diagnosis not present

## 2022-07-06 DIAGNOSIS — F17210 Nicotine dependence, cigarettes, uncomplicated: Secondary | ICD-10-CM | POA: Diagnosis not present

## 2022-07-06 DIAGNOSIS — M19041 Primary osteoarthritis, right hand: Secondary | ICD-10-CM | POA: Diagnosis not present

## 2022-07-06 DIAGNOSIS — E7800 Pure hypercholesterolemia, unspecified: Secondary | ICD-10-CM | POA: Diagnosis not present

## 2022-07-06 DIAGNOSIS — K5909 Other constipation: Secondary | ICD-10-CM | POA: Diagnosis not present

## 2022-07-06 DIAGNOSIS — Z9181 History of falling: Secondary | ICD-10-CM | POA: Diagnosis not present

## 2022-07-06 DIAGNOSIS — M109 Gout, unspecified: Secondary | ICD-10-CM | POA: Diagnosis not present

## 2022-07-06 DIAGNOSIS — J449 Chronic obstructive pulmonary disease, unspecified: Secondary | ICD-10-CM | POA: Diagnosis not present

## 2022-07-06 DIAGNOSIS — Z79891 Long term (current) use of opiate analgesic: Secondary | ICD-10-CM | POA: Diagnosis not present

## 2022-07-06 DIAGNOSIS — I10 Essential (primary) hypertension: Secondary | ICD-10-CM | POA: Diagnosis not present

## 2022-07-06 DIAGNOSIS — E538 Deficiency of other specified B group vitamins: Secondary | ICD-10-CM | POA: Diagnosis not present

## 2022-07-06 DIAGNOSIS — G43909 Migraine, unspecified, not intractable, without status migrainosus: Secondary | ICD-10-CM | POA: Diagnosis not present

## 2022-07-06 DIAGNOSIS — L97518 Non-pressure chronic ulcer of other part of right foot with other specified severity: Secondary | ICD-10-CM | POA: Diagnosis not present

## 2022-07-08 DIAGNOSIS — L97518 Non-pressure chronic ulcer of other part of right foot with other specified severity: Secondary | ICD-10-CM | POA: Diagnosis not present

## 2022-07-08 DIAGNOSIS — Z794 Long term (current) use of insulin: Secondary | ICD-10-CM | POA: Diagnosis not present

## 2022-07-08 DIAGNOSIS — I10 Essential (primary) hypertension: Secondary | ICD-10-CM | POA: Diagnosis not present

## 2022-07-08 DIAGNOSIS — E08621 Diabetes mellitus due to underlying condition with foot ulcer: Secondary | ICD-10-CM | POA: Diagnosis not present

## 2022-07-08 DIAGNOSIS — B957 Other staphylococcus as the cause of diseases classified elsewhere: Secondary | ICD-10-CM | POA: Diagnosis not present

## 2022-07-08 DIAGNOSIS — M19041 Primary osteoarthritis, right hand: Secondary | ICD-10-CM | POA: Diagnosis not present

## 2022-07-08 DIAGNOSIS — J449 Chronic obstructive pulmonary disease, unspecified: Secondary | ICD-10-CM | POA: Diagnosis not present

## 2022-07-08 DIAGNOSIS — M19042 Primary osteoarthritis, left hand: Secondary | ICD-10-CM | POA: Diagnosis not present

## 2022-07-08 DIAGNOSIS — Z79891 Long term (current) use of opiate analgesic: Secondary | ICD-10-CM | POA: Diagnosis not present

## 2022-07-08 DIAGNOSIS — K5909 Other constipation: Secondary | ICD-10-CM | POA: Diagnosis not present

## 2022-07-08 DIAGNOSIS — Z9181 History of falling: Secondary | ICD-10-CM | POA: Diagnosis not present

## 2022-07-08 DIAGNOSIS — G894 Chronic pain syndrome: Secondary | ICD-10-CM | POA: Diagnosis not present

## 2022-07-08 DIAGNOSIS — E1169 Type 2 diabetes mellitus with other specified complication: Secondary | ICD-10-CM | POA: Diagnosis not present

## 2022-07-08 DIAGNOSIS — E11621 Type 2 diabetes mellitus with foot ulcer: Secondary | ICD-10-CM | POA: Diagnosis not present

## 2022-07-08 DIAGNOSIS — E538 Deficiency of other specified B group vitamins: Secondary | ICD-10-CM | POA: Diagnosis not present

## 2022-07-08 DIAGNOSIS — F17210 Nicotine dependence, cigarettes, uncomplicated: Secondary | ICD-10-CM | POA: Diagnosis not present

## 2022-07-08 DIAGNOSIS — M109 Gout, unspecified: Secondary | ICD-10-CM | POA: Diagnosis not present

## 2022-07-08 DIAGNOSIS — Z981 Arthrodesis status: Secondary | ICD-10-CM | POA: Diagnosis not present

## 2022-07-08 DIAGNOSIS — H5213 Myopia, bilateral: Secondary | ICD-10-CM | POA: Diagnosis not present

## 2022-07-08 DIAGNOSIS — M86671 Other chronic osteomyelitis, right ankle and foot: Secondary | ICD-10-CM | POA: Diagnosis not present

## 2022-07-08 DIAGNOSIS — E7800 Pure hypercholesterolemia, unspecified: Secondary | ICD-10-CM | POA: Diagnosis not present

## 2022-07-08 DIAGNOSIS — M545 Low back pain, unspecified: Secondary | ICD-10-CM | POA: Diagnosis not present

## 2022-07-08 DIAGNOSIS — E559 Vitamin D deficiency, unspecified: Secondary | ICD-10-CM | POA: Diagnosis not present

## 2022-07-08 DIAGNOSIS — E1142 Type 2 diabetes mellitus with diabetic polyneuropathy: Secondary | ICD-10-CM | POA: Diagnosis not present

## 2022-07-08 DIAGNOSIS — G43909 Migraine, unspecified, not intractable, without status migrainosus: Secondary | ICD-10-CM | POA: Diagnosis not present

## 2022-07-08 DIAGNOSIS — L02611 Cutaneous abscess of right foot: Secondary | ICD-10-CM | POA: Diagnosis not present

## 2022-07-08 DIAGNOSIS — E039 Hypothyroidism, unspecified: Secondary | ICD-10-CM | POA: Diagnosis not present

## 2022-07-09 DIAGNOSIS — E1165 Type 2 diabetes mellitus with hyperglycemia: Secondary | ICD-10-CM | POA: Diagnosis not present

## 2022-07-11 ENCOUNTER — Encounter: Payer: Self-pay | Admitting: Internal Medicine

## 2022-07-11 ENCOUNTER — Ambulatory Visit (INDEPENDENT_AMBULATORY_CARE_PROVIDER_SITE_OTHER): Payer: Medicare Other | Admitting: Internal Medicine

## 2022-07-11 DIAGNOSIS — E538 Deficiency of other specified B group vitamins: Secondary | ICD-10-CM

## 2022-07-11 DIAGNOSIS — E1169 Type 2 diabetes mellitus with other specified complication: Secondary | ICD-10-CM | POA: Diagnosis not present

## 2022-07-11 DIAGNOSIS — E559 Vitamin D deficiency, unspecified: Secondary | ICD-10-CM | POA: Diagnosis not present

## 2022-07-11 DIAGNOSIS — M5440 Lumbago with sciatica, unspecified side: Secondary | ICD-10-CM | POA: Diagnosis not present

## 2022-07-11 DIAGNOSIS — G8929 Other chronic pain: Secondary | ICD-10-CM

## 2022-07-11 DIAGNOSIS — L97518 Non-pressure chronic ulcer of other part of right foot with other specified severity: Secondary | ICD-10-CM | POA: Diagnosis not present

## 2022-07-11 DIAGNOSIS — Z23 Encounter for immunization: Secondary | ICD-10-CM | POA: Diagnosis not present

## 2022-07-11 DIAGNOSIS — E1140 Type 2 diabetes mellitus with diabetic neuropathy, unspecified: Secondary | ICD-10-CM | POA: Diagnosis not present

## 2022-07-11 DIAGNOSIS — Z794 Long term (current) use of insulin: Secondary | ICD-10-CM | POA: Diagnosis not present

## 2022-07-11 DIAGNOSIS — Z9181 History of falling: Secondary | ICD-10-CM | POA: Diagnosis not present

## 2022-07-11 DIAGNOSIS — M19042 Primary osteoarthritis, left hand: Secondary | ICD-10-CM | POA: Diagnosis not present

## 2022-07-11 DIAGNOSIS — M109 Gout, unspecified: Secondary | ICD-10-CM | POA: Diagnosis not present

## 2022-07-11 DIAGNOSIS — E11621 Type 2 diabetes mellitus with foot ulcer: Secondary | ICD-10-CM | POA: Diagnosis not present

## 2022-07-11 DIAGNOSIS — K5909 Other constipation: Secondary | ICD-10-CM | POA: Diagnosis not present

## 2022-07-11 DIAGNOSIS — G43909 Migraine, unspecified, not intractable, without status migrainosus: Secondary | ICD-10-CM | POA: Diagnosis not present

## 2022-07-11 DIAGNOSIS — G894 Chronic pain syndrome: Secondary | ICD-10-CM | POA: Diagnosis not present

## 2022-07-11 DIAGNOSIS — F17210 Nicotine dependence, cigarettes, uncomplicated: Secondary | ICD-10-CM | POA: Diagnosis not present

## 2022-07-11 DIAGNOSIS — M19041 Primary osteoarthritis, right hand: Secondary | ICD-10-CM | POA: Diagnosis not present

## 2022-07-11 DIAGNOSIS — B957 Other staphylococcus as the cause of diseases classified elsewhere: Secondary | ICD-10-CM | POA: Diagnosis not present

## 2022-07-11 DIAGNOSIS — I10 Essential (primary) hypertension: Secondary | ICD-10-CM | POA: Diagnosis not present

## 2022-07-11 DIAGNOSIS — E039 Hypothyroidism, unspecified: Secondary | ICD-10-CM | POA: Diagnosis not present

## 2022-07-11 DIAGNOSIS — E7800 Pure hypercholesterolemia, unspecified: Secondary | ICD-10-CM | POA: Diagnosis not present

## 2022-07-11 DIAGNOSIS — M545 Low back pain, unspecified: Secondary | ICD-10-CM | POA: Diagnosis not present

## 2022-07-11 DIAGNOSIS — F419 Anxiety disorder, unspecified: Secondary | ICD-10-CM

## 2022-07-11 DIAGNOSIS — E1142 Type 2 diabetes mellitus with diabetic polyneuropathy: Secondary | ICD-10-CM | POA: Diagnosis not present

## 2022-07-11 DIAGNOSIS — J449 Chronic obstructive pulmonary disease, unspecified: Secondary | ICD-10-CM | POA: Diagnosis not present

## 2022-07-11 DIAGNOSIS — M86671 Other chronic osteomyelitis, right ankle and foot: Secondary | ICD-10-CM | POA: Diagnosis not present

## 2022-07-11 DIAGNOSIS — Z981 Arthrodesis status: Secondary | ICD-10-CM | POA: Diagnosis not present

## 2022-07-11 DIAGNOSIS — Z79891 Long term (current) use of opiate analgesic: Secondary | ICD-10-CM | POA: Diagnosis not present

## 2022-07-11 MED ORDER — OXYCODONE HCL 15 MG PO TABS: 15.0000 mg | ORAL_TABLET | Freq: Four times a day (QID) | ORAL | 0 refills | Status: DC | PRN

## 2022-07-11 NOTE — Assessment & Plan Note (Signed)
On B12 

## 2022-07-11 NOTE — Assessment & Plan Note (Signed)
Chronic Occ panic attacks Cont on Clonazepam prn  Potential benefits of a long term benzodiazepines  use as well as potential risks  and complications were explained to the patient and were aknowledged.

## 2022-07-11 NOTE — Assessment & Plan Note (Signed)
On Oxycodone 15 mg qid prn  Potential benefits of a long term opioids use as well as potential risks (i.e. addiction risk, apnea etc) and complications (i.e. Somnolence, constipation and others) were explained to the patient and were aknowledged.

## 2022-07-11 NOTE — Assessment & Plan Note (Signed)
CBG<120 On Insulin 70/30; Libre 3 for CGM 

## 2022-07-11 NOTE — Progress Notes (Signed)
Subjective:  Patient ID: Katherine Herrera, female    DOB: 1966-07-12  Age: 56 y.o. MRN: [id number]  CC: Follow-up (2 month f/u - Flu shot)   HPI Katherine Herrera presents for chronic pain, DM, chronic wound in the R foot, panic attacks  Outpatient Medications Prior to Visit  Medication Sig Dispense Refill   B-D INS SYR ULTRAFINE 1CC/31G 31G X 5/16" 1 ML MISC USE AS DIRECTED 100 each 0   baclofen (LIORESAL) 10 MG tablet TAKE 2 TABLETS BY MOUTH TWICE  DAILY 180 tablet 1   bisacodyl (DULCOLAX) 5 MG EC tablet Take 10 mg by mouth at bedtime.     Blood Glucose Monitoring Suppl (ONETOUCH VERIO) w/Device KIT      cholecalciferol (VITAMIN D3) 25 MCG (1000 UNIT) tablet Take 1,000 Units by mouth daily.     colchicine 0.6 MG tablet TAKE 2 TABLETS AS NEEDED GOUT ATTACK. THEN TAKE ANOTHER 1 TAB IN 1-2 HOURS. DONT REPEAT FOR 3 DAYS. (Patient taking differently: Take 0.6 mg by mouth daily as needed (gout).) 18 tablet 1   docusate sodium (COLACE) 100 MG capsule Take 100 mg by mouth at bedtime.     ezetimibe (ZETIA) 10 MG tablet TAKE 1 TABLET BY MOUTH DAILY 100 tablet 2   FLUoxetine (PROZAC) 10 MG capsule Take 1 capsule (10 mg total) by mouth daily. 90 capsule 3   gabapentin (NEURONTIN) 800 MG tablet TAKE 1 TABLET BY MOUTH 4  TIMES DAILY (Patient taking differently: Take 800 mg by mouth 3 (three) times daily.) 360 tablet 3   insulin NPH-regular Human (70-30) 100 UNIT/ML injection Inject 20 Units into the skin 2 (two) times daily with a meal. (Patient taking differently: Inject 30-40 Units into the skin See admin instructions. 30 units in the morning and afternoon and 40 units at night) 10 mL 11   lamoTRIgine (LAMICTAL) 25 MG tablet TAKE 1 TABLET BY MOUTH  TWICE DAILY 180 tablet 1   Lancet Devices (ACCU-CHEK SOFTCLIX) lancets Test two times daily 100 each 12   Lancets (ONETOUCH DELICA PLUS [id number]) MISC      levothyroxine (SYNTHROID) 75 MCG tablet TAKE 1 TABLET BY MOUTH  DAILY (Patient taking differently:  Take 75 mcg by mouth daily before breakfast.) 90 tablet 3   lisinopril (ZESTRIL) 10 MG tablet TAKE 1 TABLET BY MOUTH DAILY 100 tablet 2   loratadine (CLARITIN) 10 MG tablet Take 1 tablet (10 mg total) by mouth daily as needed for allergies. 100 tablet 3   ondansetron (ZOFRAN) 4 MG tablet Take 4 mg by mouth every 8 (eight) hours as needed for nausea or vomiting.     rOPINIRole (REQUIP) 0.5 MG tablet TAKE 1 TABLET BY MOUTH AT  BEDTIME (Patient taking differently: Take 0.5 mg by mouth daily.) 90 tablet 3   silver sulfADIAZINE (SILVADENE) 1 % cream APPLY PEA-SIZED AMOUNT TO WOUND DAILY. 50 g 0   vitamin B-12 (CYANOCOBALAMIN) 1000 MCG tablet Take 1,000 mcg by mouth daily.     oxyCODONE (ROXICODONE) 15 MG immediate release tablet Take 1 tablet (15 mg total) by mouth every 6 (six) hours as needed for pain. 120 tablet 0   clonazePAM (KLONOPIN) 0.5 MG tablet Take 1 tablet (0.5 mg total) by mouth 2 (two) times daily as needed for up to 5 days for anxiety. 60 tablet 1   No facility-administered medications prior to visit.    ROS: Review of Systems  Constitutional:  Negative for activity change, appetite change, chills, fatigue and unexpected weight  change.  HENT:  Negative for congestion, mouth sores and sinus pressure.   Eyes:  Negative for visual disturbance.  Respiratory:  Negative for cough and chest tightness.   Cardiovascular:  Positive for leg swelling.  Gastrointestinal:  Negative for abdominal pain, nausea and vomiting.  Genitourinary:  Negative for difficulty urinating, frequency and vaginal pain.  Musculoskeletal:  Positive for arthralgias, back pain and gait problem.  Skin:  Positive for color change and wound. Negative for pallor and rash.  Neurological:  Negative for dizziness, tremors, weakness, numbness and headaches.  Hematological:  Does not bruise/bleed easily.  Psychiatric/Behavioral:  Negative for confusion and sleep disturbance. The patient is not nervous/anxious.      Objective:  BP (!) 108/52 (BP Location: Left Arm)   Pulse 95   Temp 98.4 F (36.9 C) (Oral)   Ht '5\' 9"'  (1.753 m)   SpO2 95%   BMI 31.31 kg/m   BP Readings from Last 3 Encounters:  07/11/22 (!) 108/52  05/09/22 (!) 90/50  04/19/22 117/77    Wt Readings from Last 3 Encounters:  05/09/22 212 lb (96.2 kg)  04/13/22 216 lb 14.9 oz (98.4 kg)  02/23/22 219 lb (99.3 kg)    Physical Exam Constitutional:      General: She is not in acute distress.    Appearance: She is well-developed. She is obese.  HENT:     Head: Normocephalic.     Right Ear: External ear normal.     Left Ear: External ear normal.     Nose: Nose normal.  Eyes:     General:        Right eye: No discharge.        Left eye: No discharge.     Conjunctiva/sclera: Conjunctivae normal.     Pupils: Pupils are equal, round, and reactive to light.  Neck:     Thyroid: No thyromegaly.     Vascular: No JVD.     Trachea: No tracheal deviation.  Cardiovascular:     Rate and Rhythm: Normal rate and regular rhythm.     Heart sounds: Normal heart sounds.  Pulmonary:     Effort: No respiratory distress.     Breath sounds: No stridor. No wheezing.  Abdominal:     General: Bowel sounds are normal. There is no distension.     Palpations: Abdomen is soft. There is no mass.     Tenderness: There is no abdominal tenderness. There is no guarding or rebound.  Musculoskeletal:        General: No tenderness.     Cervical back: Normal range of motion and neck supple. No rigidity.  Lymphadenopathy:     Cervical: No cervical adenopathy.  Skin:    Findings: No erythema or rash.  Neurological:     Mental Status: She is oriented to person, place, and time.     Cranial Nerves: No cranial nerve deficit.     Motor: No abnormal muscle tone.     Coordination: Coordination normal.     Deep Tendon Reflexes: Reflexes normal.  Psychiatric:        Behavior: Behavior normal.        Thought Content: Thought content normal.         Judgment: Judgment normal.   Knee scooter, vacuum device  Lab Results  Component Value Date   WBC 13.4 (H) 04/15/2022   HGB 14.0 04/15/2022   HCT 42.1 04/15/2022   PLT 303 04/15/2022   GLUCOSE 410 (H) 04/16/2022  CHOL 215 (H) 02/23/2022   TRIG 301.0 (H) 02/23/2022   HDL 34.70 (L) 02/23/2022   LDLDIRECT 135.0 02/23/2022   LDLCALC 81 01/02/2013   ALT 12 04/08/2022   AST 12 (L) 04/08/2022   NA 139 04/15/2022   K 4.5 04/15/2022   CL 105 04/15/2022   CREATININE 0.96 04/15/2022   BUN 17 04/15/2022   CO2 26 04/15/2022   TSH 0.99 09/09/2021   INR 1.0 07/19/2019   HGBA1C 7.5 (H) 04/10/2022   MICROALBUR 0.9 07/19/2016    DG Foot Complete Right  Result Date: 04/08/2022 CLINICAL DATA:  Right foot pain. EXAM: RIGHT FOOT COMPLETE - 3+ VIEW COMPARISON:  Right foot x-rays dated Mar 04, 2022. MRI right foot dated Mar 11, 2022. FINDINGS: Similar degree of chronic bony destruction involving the base of fifth metatarsal and cuboid. No progressive bony destruction. 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 No acute fracture or dislocation. Unchanged soft tissue wound along the lateral mid and forefoot. IMPRESSION: 1. Similar degree of chronic osteomyelitis involving the base of the fifth metatarsal and cuboid. No progressive bony destruction. Electronically Signed   By: Titus Dubin M.D.   On: 04/08/2022 14:30    Assessment & Plan:   Problem List Items Addressed This Visit     Anxiety disorder    Chronic Occ panic attacks Cont on Clonazepam prn  Potential benefits of a long term benzodiazepines  use as well as potential risks  and complications were explained to the patient and were aknowledged.      B12 deficiency    On B12      LOW BACK PAIN    On Oxycodone 15 mg qid prn  Potential benefits of a long term opioids use as well as potential risks (i.e. addiction risk, apnea etc) and complications (i.e. Somnolence, constipation and others) were explained to the patient  and were aknowledged.       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    Dispense:  120 tablet    Refill:  0    Please fill on or after 07/09/22   oxyCODONE (ROXICODONE) 15 MG immediate release tablet    Sig: Take 1 tablet (15 mg total) by mouth every 6 (six) hours as needed for pain.    Dispense:  120 tablet    Refill:  0    Please fill on or after 08/08/22      Follow-up: No follow-ups on file.  Walker Kehr, MD

## 2022-07-12 DIAGNOSIS — M19042 Primary osteoarthritis, left hand: Secondary | ICD-10-CM | POA: Diagnosis not present

## 2022-07-12 DIAGNOSIS — M109 Gout, unspecified: Secondary | ICD-10-CM | POA: Diagnosis not present

## 2022-07-12 DIAGNOSIS — E1142 Type 2 diabetes mellitus with diabetic polyneuropathy: Secondary | ICD-10-CM | POA: Diagnosis not present

## 2022-07-12 DIAGNOSIS — M545 Low back pain, unspecified: Secondary | ICD-10-CM | POA: Diagnosis not present

## 2022-07-12 DIAGNOSIS — Z9181 History of falling: Secondary | ICD-10-CM

## 2022-07-12 DIAGNOSIS — M19041 Primary osteoarthritis, right hand: Secondary | ICD-10-CM | POA: Diagnosis not present

## 2022-07-12 DIAGNOSIS — Z79891 Long term (current) use of opiate analgesic: Secondary | ICD-10-CM

## 2022-07-12 DIAGNOSIS — G894 Chronic pain syndrome: Secondary | ICD-10-CM

## 2022-07-12 DIAGNOSIS — G43909 Migraine, unspecified, not intractable, without status migrainosus: Secondary | ICD-10-CM

## 2022-07-12 DIAGNOSIS — I10 Essential (primary) hypertension: Secondary | ICD-10-CM | POA: Diagnosis not present

## 2022-07-12 DIAGNOSIS — E538 Deficiency of other specified B group vitamins: Secondary | ICD-10-CM

## 2022-07-12 DIAGNOSIS — M86671 Other chronic osteomyelitis, right ankle and foot: Secondary | ICD-10-CM | POA: Diagnosis not present

## 2022-07-12 DIAGNOSIS — B957 Other staphylococcus as the cause of diseases classified elsewhere: Secondary | ICD-10-CM | POA: Diagnosis not present

## 2022-07-12 DIAGNOSIS — Z981 Arthrodesis status: Secondary | ICD-10-CM

## 2022-07-12 DIAGNOSIS — J449 Chronic obstructive pulmonary disease, unspecified: Secondary | ICD-10-CM | POA: Diagnosis not present

## 2022-07-12 DIAGNOSIS — E11621 Type 2 diabetes mellitus with foot ulcer: Secondary | ICD-10-CM | POA: Diagnosis not present

## 2022-07-12 DIAGNOSIS — L97518 Non-pressure chronic ulcer of other part of right foot with other specified severity: Secondary | ICD-10-CM | POA: Diagnosis not present

## 2022-07-12 DIAGNOSIS — F17210 Nicotine dependence, cigarettes, uncomplicated: Secondary | ICD-10-CM

## 2022-07-12 DIAGNOSIS — E039 Hypothyroidism, unspecified: Secondary | ICD-10-CM

## 2022-07-12 DIAGNOSIS — Z794 Long term (current) use of insulin: Secondary | ICD-10-CM

## 2022-07-12 DIAGNOSIS — F419 Anxiety disorder, unspecified: Secondary | ICD-10-CM

## 2022-07-12 DIAGNOSIS — E559 Vitamin D deficiency, unspecified: Secondary | ICD-10-CM

## 2022-07-12 DIAGNOSIS — E7800 Pure hypercholesterolemia, unspecified: Secondary | ICD-10-CM

## 2022-07-12 DIAGNOSIS — E1169 Type 2 diabetes mellitus with other specified complication: Secondary | ICD-10-CM | POA: Diagnosis not present

## 2022-07-12 DIAGNOSIS — K5909 Other constipation: Secondary | ICD-10-CM

## 2022-07-13 DIAGNOSIS — B957 Other staphylococcus as the cause of diseases classified elsewhere: Secondary | ICD-10-CM | POA: Diagnosis not present

## 2022-07-13 DIAGNOSIS — E7800 Pure hypercholesterolemia, unspecified: Secondary | ICD-10-CM | POA: Diagnosis not present

## 2022-07-13 DIAGNOSIS — F17210 Nicotine dependence, cigarettes, uncomplicated: Secondary | ICD-10-CM | POA: Diagnosis not present

## 2022-07-13 DIAGNOSIS — E1140 Type 2 diabetes mellitus with diabetic neuropathy, unspecified: Secondary | ICD-10-CM | POA: Diagnosis not present

## 2022-07-13 DIAGNOSIS — Z9181 History of falling: Secondary | ICD-10-CM | POA: Diagnosis not present

## 2022-07-13 DIAGNOSIS — Z79891 Long term (current) use of opiate analgesic: Secondary | ICD-10-CM | POA: Diagnosis not present

## 2022-07-13 DIAGNOSIS — J449 Chronic obstructive pulmonary disease, unspecified: Secondary | ICD-10-CM | POA: Diagnosis not present

## 2022-07-13 DIAGNOSIS — E559 Vitamin D deficiency, unspecified: Secondary | ICD-10-CM | POA: Diagnosis not present

## 2022-07-13 DIAGNOSIS — Z981 Arthrodesis status: Secondary | ICD-10-CM | POA: Diagnosis not present

## 2022-07-13 DIAGNOSIS — E11621 Type 2 diabetes mellitus with foot ulcer: Secondary | ICD-10-CM | POA: Diagnosis not present

## 2022-07-13 DIAGNOSIS — K5909 Other constipation: Secondary | ICD-10-CM | POA: Diagnosis not present

## 2022-07-13 DIAGNOSIS — Z794 Long term (current) use of insulin: Secondary | ICD-10-CM | POA: Diagnosis not present

## 2022-07-13 DIAGNOSIS — E1165 Type 2 diabetes mellitus with hyperglycemia: Secondary | ICD-10-CM | POA: Diagnosis not present

## 2022-07-13 DIAGNOSIS — G43909 Migraine, unspecified, not intractable, without status migrainosus: Secondary | ICD-10-CM | POA: Diagnosis not present

## 2022-07-13 DIAGNOSIS — G894 Chronic pain syndrome: Secondary | ICD-10-CM | POA: Diagnosis not present

## 2022-07-13 DIAGNOSIS — M86671 Other chronic osteomyelitis, right ankle and foot: Secondary | ICD-10-CM | POA: Diagnosis not present

## 2022-07-13 DIAGNOSIS — E1169 Type 2 diabetes mellitus with other specified complication: Secondary | ICD-10-CM | POA: Diagnosis not present

## 2022-07-13 DIAGNOSIS — M545 Low back pain, unspecified: Secondary | ICD-10-CM | POA: Diagnosis not present

## 2022-07-13 DIAGNOSIS — E538 Deficiency of other specified B group vitamins: Secondary | ICD-10-CM | POA: Diagnosis not present

## 2022-07-13 DIAGNOSIS — M19041 Primary osteoarthritis, right hand: Secondary | ICD-10-CM | POA: Diagnosis not present

## 2022-07-13 DIAGNOSIS — I10 Essential (primary) hypertension: Secondary | ICD-10-CM | POA: Diagnosis not present

## 2022-07-13 DIAGNOSIS — E039 Hypothyroidism, unspecified: Secondary | ICD-10-CM | POA: Diagnosis not present

## 2022-07-13 DIAGNOSIS — M109 Gout, unspecified: Secondary | ICD-10-CM | POA: Diagnosis not present

## 2022-07-13 DIAGNOSIS — N189 Chronic kidney disease, unspecified: Secondary | ICD-10-CM | POA: Diagnosis not present

## 2022-07-13 DIAGNOSIS — L97518 Non-pressure chronic ulcer of other part of right foot with other specified severity: Secondary | ICD-10-CM | POA: Diagnosis not present

## 2022-07-13 DIAGNOSIS — M19042 Primary osteoarthritis, left hand: Secondary | ICD-10-CM | POA: Diagnosis not present

## 2022-07-13 DIAGNOSIS — E1142 Type 2 diabetes mellitus with diabetic polyneuropathy: Secondary | ICD-10-CM | POA: Diagnosis not present

## 2022-07-15 DIAGNOSIS — G43909 Migraine, unspecified, not intractable, without status migrainosus: Secondary | ICD-10-CM | POA: Diagnosis not present

## 2022-07-15 DIAGNOSIS — Z9181 History of falling: Secondary | ICD-10-CM | POA: Diagnosis not present

## 2022-07-15 DIAGNOSIS — Z981 Arthrodesis status: Secondary | ICD-10-CM | POA: Diagnosis not present

## 2022-07-15 DIAGNOSIS — M86671 Other chronic osteomyelitis, right ankle and foot: Secondary | ICD-10-CM | POA: Diagnosis not present

## 2022-07-15 DIAGNOSIS — K5909 Other constipation: Secondary | ICD-10-CM | POA: Diagnosis not present

## 2022-07-15 DIAGNOSIS — E039 Hypothyroidism, unspecified: Secondary | ICD-10-CM | POA: Diagnosis not present

## 2022-07-15 DIAGNOSIS — M545 Low back pain, unspecified: Secondary | ICD-10-CM | POA: Diagnosis not present

## 2022-07-15 DIAGNOSIS — F17210 Nicotine dependence, cigarettes, uncomplicated: Secondary | ICD-10-CM | POA: Diagnosis not present

## 2022-07-15 DIAGNOSIS — M19041 Primary osteoarthritis, right hand: Secondary | ICD-10-CM | POA: Diagnosis not present

## 2022-07-15 DIAGNOSIS — M19042 Primary osteoarthritis, left hand: Secondary | ICD-10-CM | POA: Diagnosis not present

## 2022-07-15 DIAGNOSIS — E559 Vitamin D deficiency, unspecified: Secondary | ICD-10-CM | POA: Diagnosis not present

## 2022-07-15 DIAGNOSIS — G894 Chronic pain syndrome: Secondary | ICD-10-CM | POA: Diagnosis not present

## 2022-07-15 DIAGNOSIS — E1169 Type 2 diabetes mellitus with other specified complication: Secondary | ICD-10-CM | POA: Diagnosis not present

## 2022-07-15 DIAGNOSIS — E11621 Type 2 diabetes mellitus with foot ulcer: Secondary | ICD-10-CM | POA: Diagnosis not present

## 2022-07-15 DIAGNOSIS — J449 Chronic obstructive pulmonary disease, unspecified: Secondary | ICD-10-CM | POA: Diagnosis not present

## 2022-07-15 DIAGNOSIS — I10 Essential (primary) hypertension: Secondary | ICD-10-CM | POA: Diagnosis not present

## 2022-07-15 DIAGNOSIS — B957 Other staphylococcus as the cause of diseases classified elsewhere: Secondary | ICD-10-CM | POA: Diagnosis not present

## 2022-07-15 DIAGNOSIS — Z794 Long term (current) use of insulin: Secondary | ICD-10-CM | POA: Diagnosis not present

## 2022-07-15 DIAGNOSIS — E7800 Pure hypercholesterolemia, unspecified: Secondary | ICD-10-CM | POA: Diagnosis not present

## 2022-07-15 DIAGNOSIS — L97518 Non-pressure chronic ulcer of other part of right foot with other specified severity: Secondary | ICD-10-CM | POA: Diagnosis not present

## 2022-07-15 DIAGNOSIS — E538 Deficiency of other specified B group vitamins: Secondary | ICD-10-CM | POA: Diagnosis not present

## 2022-07-15 DIAGNOSIS — M109 Gout, unspecified: Secondary | ICD-10-CM | POA: Diagnosis not present

## 2022-07-15 DIAGNOSIS — Z79891 Long term (current) use of opiate analgesic: Secondary | ICD-10-CM | POA: Diagnosis not present

## 2022-07-15 DIAGNOSIS — E1142 Type 2 diabetes mellitus with diabetic polyneuropathy: Secondary | ICD-10-CM | POA: Diagnosis not present

## 2022-07-18 DIAGNOSIS — E1169 Type 2 diabetes mellitus with other specified complication: Secondary | ICD-10-CM | POA: Diagnosis not present

## 2022-07-18 DIAGNOSIS — L97518 Non-pressure chronic ulcer of other part of right foot with other specified severity: Secondary | ICD-10-CM | POA: Diagnosis not present

## 2022-07-18 DIAGNOSIS — B957 Other staphylococcus as the cause of diseases classified elsewhere: Secondary | ICD-10-CM | POA: Diagnosis not present

## 2022-07-18 DIAGNOSIS — E039 Hypothyroidism, unspecified: Secondary | ICD-10-CM | POA: Diagnosis not present

## 2022-07-18 DIAGNOSIS — F17210 Nicotine dependence, cigarettes, uncomplicated: Secondary | ICD-10-CM | POA: Diagnosis not present

## 2022-07-18 DIAGNOSIS — E559 Vitamin D deficiency, unspecified: Secondary | ICD-10-CM | POA: Diagnosis not present

## 2022-07-18 DIAGNOSIS — I10 Essential (primary) hypertension: Secondary | ICD-10-CM | POA: Diagnosis not present

## 2022-07-18 DIAGNOSIS — E1142 Type 2 diabetes mellitus with diabetic polyneuropathy: Secondary | ICD-10-CM | POA: Diagnosis not present

## 2022-07-18 DIAGNOSIS — G43909 Migraine, unspecified, not intractable, without status migrainosus: Secondary | ICD-10-CM | POA: Diagnosis not present

## 2022-07-18 DIAGNOSIS — J449 Chronic obstructive pulmonary disease, unspecified: Secondary | ICD-10-CM | POA: Diagnosis not present

## 2022-07-18 DIAGNOSIS — M86671 Other chronic osteomyelitis, right ankle and foot: Secondary | ICD-10-CM | POA: Diagnosis not present

## 2022-07-18 DIAGNOSIS — Z9181 History of falling: Secondary | ICD-10-CM | POA: Diagnosis not present

## 2022-07-18 DIAGNOSIS — M19041 Primary osteoarthritis, right hand: Secondary | ICD-10-CM | POA: Diagnosis not present

## 2022-07-18 DIAGNOSIS — Z794 Long term (current) use of insulin: Secondary | ICD-10-CM | POA: Diagnosis not present

## 2022-07-18 DIAGNOSIS — E11621 Type 2 diabetes mellitus with foot ulcer: Secondary | ICD-10-CM | POA: Diagnosis not present

## 2022-07-18 DIAGNOSIS — K5909 Other constipation: Secondary | ICD-10-CM | POA: Diagnosis not present

## 2022-07-18 DIAGNOSIS — M109 Gout, unspecified: Secondary | ICD-10-CM | POA: Diagnosis not present

## 2022-07-18 DIAGNOSIS — M19042 Primary osteoarthritis, left hand: Secondary | ICD-10-CM | POA: Diagnosis not present

## 2022-07-18 DIAGNOSIS — G894 Chronic pain syndrome: Secondary | ICD-10-CM | POA: Diagnosis not present

## 2022-07-18 DIAGNOSIS — M545 Low back pain, unspecified: Secondary | ICD-10-CM | POA: Diagnosis not present

## 2022-07-18 DIAGNOSIS — Z981 Arthrodesis status: Secondary | ICD-10-CM | POA: Diagnosis not present

## 2022-07-18 DIAGNOSIS — Z79891 Long term (current) use of opiate analgesic: Secondary | ICD-10-CM | POA: Diagnosis not present

## 2022-07-18 DIAGNOSIS — E7800 Pure hypercholesterolemia, unspecified: Secondary | ICD-10-CM | POA: Diagnosis not present

## 2022-07-18 DIAGNOSIS — E538 Deficiency of other specified B group vitamins: Secondary | ICD-10-CM | POA: Diagnosis not present

## 2022-07-20 ENCOUNTER — Ambulatory Visit (INDEPENDENT_AMBULATORY_CARE_PROVIDER_SITE_OTHER): Payer: Medicare Other | Admitting: Podiatry

## 2022-07-20 DIAGNOSIS — Z79891 Long term (current) use of opiate analgesic: Secondary | ICD-10-CM | POA: Diagnosis not present

## 2022-07-20 DIAGNOSIS — Z981 Arthrodesis status: Secondary | ICD-10-CM | POA: Diagnosis not present

## 2022-07-20 DIAGNOSIS — M19042 Primary osteoarthritis, left hand: Secondary | ICD-10-CM | POA: Diagnosis not present

## 2022-07-20 DIAGNOSIS — E039 Hypothyroidism, unspecified: Secondary | ICD-10-CM | POA: Diagnosis not present

## 2022-07-20 DIAGNOSIS — E1169 Type 2 diabetes mellitus with other specified complication: Secondary | ICD-10-CM | POA: Diagnosis not present

## 2022-07-20 DIAGNOSIS — E538 Deficiency of other specified B group vitamins: Secondary | ICD-10-CM | POA: Diagnosis not present

## 2022-07-20 DIAGNOSIS — L97412 Non-pressure chronic ulcer of right heel and midfoot with fat layer exposed: Secondary | ICD-10-CM | POA: Diagnosis not present

## 2022-07-20 DIAGNOSIS — E1142 Type 2 diabetes mellitus with diabetic polyneuropathy: Secondary | ICD-10-CM

## 2022-07-20 DIAGNOSIS — K5909 Other constipation: Secondary | ICD-10-CM | POA: Diagnosis not present

## 2022-07-20 DIAGNOSIS — M86671 Other chronic osteomyelitis, right ankle and foot: Secondary | ICD-10-CM | POA: Diagnosis not present

## 2022-07-20 DIAGNOSIS — F17210 Nicotine dependence, cigarettes, uncomplicated: Secondary | ICD-10-CM | POA: Diagnosis not present

## 2022-07-20 DIAGNOSIS — Z9181 History of falling: Secondary | ICD-10-CM | POA: Diagnosis not present

## 2022-07-20 DIAGNOSIS — M545 Low back pain, unspecified: Secondary | ICD-10-CM | POA: Diagnosis not present

## 2022-07-20 DIAGNOSIS — G43909 Migraine, unspecified, not intractable, without status migrainosus: Secondary | ICD-10-CM | POA: Diagnosis not present

## 2022-07-20 DIAGNOSIS — E559 Vitamin D deficiency, unspecified: Secondary | ICD-10-CM | POA: Diagnosis not present

## 2022-07-20 DIAGNOSIS — M19041 Primary osteoarthritis, right hand: Secondary | ICD-10-CM | POA: Diagnosis not present

## 2022-07-20 DIAGNOSIS — E7800 Pure hypercholesterolemia, unspecified: Secondary | ICD-10-CM | POA: Diagnosis not present

## 2022-07-20 DIAGNOSIS — M109 Gout, unspecified: Secondary | ICD-10-CM | POA: Diagnosis not present

## 2022-07-20 DIAGNOSIS — J449 Chronic obstructive pulmonary disease, unspecified: Secondary | ICD-10-CM | POA: Diagnosis not present

## 2022-07-20 DIAGNOSIS — I10 Essential (primary) hypertension: Secondary | ICD-10-CM | POA: Diagnosis not present

## 2022-07-20 DIAGNOSIS — E11621 Type 2 diabetes mellitus with foot ulcer: Secondary | ICD-10-CM | POA: Diagnosis not present

## 2022-07-20 DIAGNOSIS — G894 Chronic pain syndrome: Secondary | ICD-10-CM | POA: Diagnosis not present

## 2022-07-20 DIAGNOSIS — Z794 Long term (current) use of insulin: Secondary | ICD-10-CM | POA: Diagnosis not present

## 2022-07-20 DIAGNOSIS — L97518 Non-pressure chronic ulcer of other part of right foot with other specified severity: Secondary | ICD-10-CM | POA: Diagnosis not present

## 2022-07-20 DIAGNOSIS — B957 Other staphylococcus as the cause of diseases classified elsewhere: Secondary | ICD-10-CM | POA: Diagnosis not present

## 2022-07-20 NOTE — Progress Notes (Signed)
Subjective:  Patient ID: Katherine Herrera, female    DOB: January 02, 1966,  MRN: [id number]  Chief Complaint  Patient presents with   Foot Ulcer    DOS: 04/13/2022 Procedure: Right foot revisional I&D washout with Integra application/VAC  56 y.o. female returns for post-op check.  Patient states that she is doing okay.  The VAC is functioning well.  Granular wound bed noted.  The wound size is decreasing the wound VAC seems to be doing really good.  No acute complaints. Review of Systems: Negative except as noted in the HPI. Denies N/V/F/Ch.  Past Medical History:  Diagnosis Date   Anxiety    Arthritis    hands   B12 deficiency    Chronic constipation    Chronic pain syndrome    neck, lower back, and foot   COPD (chronic obstructive pulmonary disease) ([place])    Diabetic ulcer of right foot ([place])    Gout    possible   History of seizure    05-26-2020  per pt as child and last one in late teen's   History of sepsis    due to lower extremity cellulits 2019 left , right 03/ 2021   History of syncope    Hyperlipidemia    Hypothyroidism    Impaired range of motion of cervical spine    per pt hx cervical fusion C4 -- 7,  has to be careful about turning her head to the right can cause her to pass out   Insulin dependent type 2 diabetes mellitus [facility])    endocrinologist--- dr Chalmers Cater    (05-26-2020 per pt check's blood sugar twice dialy and at times a third time,  fasting am blood sugar's---- 200 -- 250)   LBP (low back pain)    DR Patrice Paradise   Migraine    Peripheral neuropathy    RBBB (right bundle branch block)    Tenosynovitis of foot and ankle 09/03/2013   TTS (tarsal tunnel syndrome) 2010   Left Foot [facility] in [place]   Vitamin D deficiency    Wears glasses     Current Outpatient Medications:    B-D INS SYR ULTRAFINE 1CC/31G 31G X 5/16" 1 ML MISC, USE AS DIRECTED, Disp: 100 each, Rfl: 0   baclofen (LIORESAL) 10 MG tablet, TAKE 2 TABLETS BY MOUTH TWICE  DAILY, Disp: 180 tablet, Rfl: 1    bisacodyl (DULCOLAX) 5 MG EC tablet, Take 10 mg by mouth at bedtime., Disp: , Rfl:    Blood Glucose Monitoring Suppl (ONETOUCH VERIO) w/Device KIT, , Disp: , Rfl:    cholecalciferol (VITAMIN D3) 25 MCG (1000 UNIT) tablet, Take 1,000 Units by mouth daily., Disp: , Rfl:    clonazePAM (KLONOPIN) 0.5 MG tablet, Take 1 tablet (0.5 mg total) by mouth 2 (two) times daily as needed for up to 5 days for anxiety., Disp: 60 tablet, Rfl: 1   colchicine 0.6 MG tablet, TAKE 2 TABLETS AS NEEDED GOUT ATTACK. THEN TAKE ANOTHER 1 TAB IN 1-2 HOURS. DONT REPEAT FOR 3 DAYS. (Patient taking differently: Take 0.6 mg by mouth daily as needed (gout).), Disp: 18 tablet, Rfl: 1   docusate sodium (COLACE) 100 MG capsule, Take 100 mg by mouth at bedtime., Disp: , Rfl:    ezetimibe (ZETIA) 10 MG tablet, TAKE 1 TABLET BY MOUTH DAILY, Disp: 100 tablet, Rfl: 2   FLUoxetine (PROZAC) 10 MG capsule, Take 1 capsule (10 mg total) by mouth daily., Disp: 90 capsule, Rfl: 3   gabapentin (NEURONTIN) 800 MG  tablet, TAKE 1 TABLET BY MOUTH 4  TIMES DAILY (Patient taking differently: Take 800 mg by mouth 3 (three) times daily.), Disp: 360 tablet, Rfl: 3   insulin NPH-regular Human (70-30) 100 UNIT/ML injection, Inject 20 Units into the skin 2 (two) times daily with a meal. (Patient taking differently: Inject 30-40 Units into the skin See admin instructions. 30 units in the morning and afternoon and 40 units at night), Disp: 10 mL, Rfl: 11   lamoTRIgine (LAMICTAL) 25 MG tablet, TAKE 1 TABLET BY MOUTH  TWICE DAILY, Disp: 180 tablet, Rfl: 1   Lancet Devices (ACCU-CHEK SOFTCLIX) lancets, Test two times daily, Disp: 100 each, Rfl: 12   Lancets (ONETOUCH DELICA PLUS [id number]) MISC, , Disp: , Rfl:    levothyroxine (SYNTHROID) 75 MCG tablet, TAKE 1 TABLET BY MOUTH  DAILY (Patient taking differently: Take 75 mcg by mouth daily before breakfast.), Disp: 90 tablet, Rfl: 3   lisinopril (ZESTRIL) 10 MG tablet, TAKE 1 TABLET BY MOUTH DAILY, Disp: 100 tablet,  Rfl: 2   loratadine (CLARITIN) 10 MG tablet, Take 1 tablet (10 mg total) by mouth daily as needed for allergies., Disp: 100 tablet, Rfl: 3   ondansetron (ZOFRAN) 4 MG tablet, Take 4 mg by mouth every 8 (eight) hours as needed for nausea or vomiting., Disp: , Rfl:    oxyCODONE (ROXICODONE) 15 MG immediate release tablet, Take 1 tablet (15 mg total) by mouth every 6 (six) hours as needed for pain., Disp: 120 tablet, Rfl: 0   oxyCODONE (ROXICODONE) 15 MG immediate release tablet, Take 1 tablet (15 mg total) by mouth every 6 (six) hours as needed for pain., Disp: 120 tablet, Rfl: 0   rOPINIRole (REQUIP) 0.5 MG tablet, TAKE 1 TABLET BY MOUTH AT  BEDTIME (Patient taking differently: Take 0.5 mg by mouth daily.), Disp: 90 tablet, Rfl: 3   silver sulfADIAZINE (SILVADENE) 1 % cream, APPLY PEA-SIZED AMOUNT TO WOUND DAILY., Disp: 50 g, Rfl: 0   vitamin B-12 (CYANOCOBALAMIN) 1000 MCG tablet, Take 1,000 mcg by mouth daily., Disp: , Rfl:   Social History   Tobacco Use  Smoking Status Every Day   Packs/day: 1.00   Years: 10.00   Total pack years: 10.00   Types: Cigarettes  Smokeless Tobacco Never  Tobacco Comments   Trying to quit, smoking 1 ppd    Allergies  Allergen Reactions   Cephalexin Nausea And Vomiting   Demerol  [Meperidine Hcl] Rash   Lovastatin Other (See Comments)    Possible myalgia    Metformin And Related Nausea And Vomiting   Sulfamethoxazole-Trimethoprim     Other reaction(s): Unknown DILI, pancreatitis   Crestor [Rosuvastatin Calcium]     myalgia   Hydromorphone     Other reaction(s): Unknown   Niacin Other (See Comments)    Unknown    Other Other (See Comments)   Hydromorphone Hcl Itching    Patient has been tolerating Hydromorphone tablets without adverse effect (07/09/19) Other reaction(s): Unknown   Paroxetine Other (See Comments)    Unknown    Objective:  There were no vitals filed for this visit. There is no height or weight on file to calculate  BMI. Constitutional Well developed. Well nourished.  Vascular Foot warm and well perfused. Capillary refill normal to all digits.   Neurologic Normal speech. Oriented to person, place, and time. Epicritic sensation to light touch grossly present bilaterally.  Dermatologic Granular wound bed noted to the right foot.  Debridement was carried out minimally.  No purulent drainage  noted slight malodor present likely due to maceration  Orthopedic: Tenderness to palpation noted about the surgical site.   Radiographs: None Assessment:   1. Diabetic ulcer of right midfoot associated with type 2 diabetes mellitus, with fat layer exposed ([place])   2. DM type 2 with diabetic peripheral neuropathy ([place])      Plan:  Patient was evaluated and treated and all questions answered.  S/p foot surgery right -Progressing as expected post-operatively. -XR: See above -WB Status: Partial weightbearing to the heel/nonweightbearing in walker -Granular wound bed noted.  Wound size is decreasing. -Medications: None -Reapply the VAC on today.  Continue Betadine wet-to-dry dressing until then -Minimal debridement was carried out in standard technique.  No complication noted.  No follow-ups on file. Heling wel granular tissue

## 2022-07-22 DIAGNOSIS — F17210 Nicotine dependence, cigarettes, uncomplicated: Secondary | ICD-10-CM | POA: Diagnosis not present

## 2022-07-22 DIAGNOSIS — Z794 Long term (current) use of insulin: Secondary | ICD-10-CM | POA: Diagnosis not present

## 2022-07-22 DIAGNOSIS — E538 Deficiency of other specified B group vitamins: Secondary | ICD-10-CM | POA: Diagnosis not present

## 2022-07-22 DIAGNOSIS — M19042 Primary osteoarthritis, left hand: Secondary | ICD-10-CM | POA: Diagnosis not present

## 2022-07-22 DIAGNOSIS — E039 Hypothyroidism, unspecified: Secondary | ICD-10-CM | POA: Diagnosis not present

## 2022-07-22 DIAGNOSIS — M19041 Primary osteoarthritis, right hand: Secondary | ICD-10-CM | POA: Diagnosis not present

## 2022-07-22 DIAGNOSIS — E1142 Type 2 diabetes mellitus with diabetic polyneuropathy: Secondary | ICD-10-CM | POA: Diagnosis not present

## 2022-07-22 DIAGNOSIS — Z981 Arthrodesis status: Secondary | ICD-10-CM | POA: Diagnosis not present

## 2022-07-22 DIAGNOSIS — M109 Gout, unspecified: Secondary | ICD-10-CM | POA: Diagnosis not present

## 2022-07-22 DIAGNOSIS — Z9181 History of falling: Secondary | ICD-10-CM | POA: Diagnosis not present

## 2022-07-22 DIAGNOSIS — M86671 Other chronic osteomyelitis, right ankle and foot: Secondary | ICD-10-CM | POA: Diagnosis not present

## 2022-07-22 DIAGNOSIS — E11621 Type 2 diabetes mellitus with foot ulcer: Secondary | ICD-10-CM | POA: Diagnosis not present

## 2022-07-22 DIAGNOSIS — G894 Chronic pain syndrome: Secondary | ICD-10-CM | POA: Diagnosis not present

## 2022-07-22 DIAGNOSIS — G43909 Migraine, unspecified, not intractable, without status migrainosus: Secondary | ICD-10-CM | POA: Diagnosis not present

## 2022-07-22 DIAGNOSIS — L97518 Non-pressure chronic ulcer of other part of right foot with other specified severity: Secondary | ICD-10-CM | POA: Diagnosis not present

## 2022-07-22 DIAGNOSIS — I10 Essential (primary) hypertension: Secondary | ICD-10-CM | POA: Diagnosis not present

## 2022-07-22 DIAGNOSIS — E559 Vitamin D deficiency, unspecified: Secondary | ICD-10-CM | POA: Diagnosis not present

## 2022-07-22 DIAGNOSIS — K5909 Other constipation: Secondary | ICD-10-CM | POA: Diagnosis not present

## 2022-07-22 DIAGNOSIS — B957 Other staphylococcus as the cause of diseases classified elsewhere: Secondary | ICD-10-CM | POA: Diagnosis not present

## 2022-07-22 DIAGNOSIS — M545 Low back pain, unspecified: Secondary | ICD-10-CM | POA: Diagnosis not present

## 2022-07-22 DIAGNOSIS — Z79891 Long term (current) use of opiate analgesic: Secondary | ICD-10-CM | POA: Diagnosis not present

## 2022-07-22 DIAGNOSIS — E7800 Pure hypercholesterolemia, unspecified: Secondary | ICD-10-CM | POA: Diagnosis not present

## 2022-07-22 DIAGNOSIS — E1169 Type 2 diabetes mellitus with other specified complication: Secondary | ICD-10-CM | POA: Diagnosis not present

## 2022-07-22 DIAGNOSIS — J449 Chronic obstructive pulmonary disease, unspecified: Secondary | ICD-10-CM | POA: Diagnosis not present

## 2022-07-25 ENCOUNTER — Telehealth: Payer: Self-pay | Admitting: *Deleted

## 2022-07-25 DIAGNOSIS — E7800 Pure hypercholesterolemia, unspecified: Secondary | ICD-10-CM | POA: Diagnosis not present

## 2022-07-25 DIAGNOSIS — E559 Vitamin D deficiency, unspecified: Secondary | ICD-10-CM | POA: Diagnosis not present

## 2022-07-25 DIAGNOSIS — E1169 Type 2 diabetes mellitus with other specified complication: Secondary | ICD-10-CM | POA: Diagnosis not present

## 2022-07-25 DIAGNOSIS — Z79891 Long term (current) use of opiate analgesic: Secondary | ICD-10-CM | POA: Diagnosis not present

## 2022-07-25 DIAGNOSIS — M19041 Primary osteoarthritis, right hand: Secondary | ICD-10-CM | POA: Diagnosis not present

## 2022-07-25 DIAGNOSIS — B957 Other staphylococcus as the cause of diseases classified elsewhere: Secondary | ICD-10-CM | POA: Diagnosis not present

## 2022-07-25 DIAGNOSIS — M86671 Other chronic osteomyelitis, right ankle and foot: Secondary | ICD-10-CM | POA: Diagnosis not present

## 2022-07-25 DIAGNOSIS — K5909 Other constipation: Secondary | ICD-10-CM | POA: Diagnosis not present

## 2022-07-25 DIAGNOSIS — M109 Gout, unspecified: Secondary | ICD-10-CM | POA: Diagnosis not present

## 2022-07-25 DIAGNOSIS — E039 Hypothyroidism, unspecified: Secondary | ICD-10-CM | POA: Diagnosis not present

## 2022-07-25 DIAGNOSIS — Z981 Arthrodesis status: Secondary | ICD-10-CM | POA: Diagnosis not present

## 2022-07-25 DIAGNOSIS — E538 Deficiency of other specified B group vitamins: Secondary | ICD-10-CM | POA: Diagnosis not present

## 2022-07-25 DIAGNOSIS — Z9181 History of falling: Secondary | ICD-10-CM | POA: Diagnosis not present

## 2022-07-25 DIAGNOSIS — L97518 Non-pressure chronic ulcer of other part of right foot with other specified severity: Secondary | ICD-10-CM | POA: Diagnosis not present

## 2022-07-25 DIAGNOSIS — I10 Essential (primary) hypertension: Secondary | ICD-10-CM | POA: Diagnosis not present

## 2022-07-25 DIAGNOSIS — E11621 Type 2 diabetes mellitus with foot ulcer: Secondary | ICD-10-CM | POA: Diagnosis not present

## 2022-07-25 DIAGNOSIS — E1142 Type 2 diabetes mellitus with diabetic polyneuropathy: Secondary | ICD-10-CM | POA: Diagnosis not present

## 2022-07-25 DIAGNOSIS — M19042 Primary osteoarthritis, left hand: Secondary | ICD-10-CM | POA: Diagnosis not present

## 2022-07-25 DIAGNOSIS — J449 Chronic obstructive pulmonary disease, unspecified: Secondary | ICD-10-CM | POA: Diagnosis not present

## 2022-07-25 DIAGNOSIS — G43909 Migraine, unspecified, not intractable, without status migrainosus: Secondary | ICD-10-CM | POA: Diagnosis not present

## 2022-07-25 DIAGNOSIS — Z794 Long term (current) use of insulin: Secondary | ICD-10-CM | POA: Diagnosis not present

## 2022-07-25 DIAGNOSIS — F17210 Nicotine dependence, cigarettes, uncomplicated: Secondary | ICD-10-CM | POA: Diagnosis not present

## 2022-07-25 DIAGNOSIS — M545 Low back pain, unspecified: Secondary | ICD-10-CM | POA: Diagnosis not present

## 2022-07-25 DIAGNOSIS — G894 Chronic pain syndrome: Secondary | ICD-10-CM | POA: Diagnosis not present

## 2022-07-25 NOTE — Telephone Encounter (Addendum)
Spoke with the nurse @ [facility], they will also need a verbal order to discontinue the wound vac on 09/29 Restart it on 10/16,will be using gauzes to wrap, Kerlix, ache bandages,will be applying betadine as well. Please advise.

## 2022-07-25 NOTE — Telephone Encounter (Signed)
Patient is calling to ask if it is ok to fly with open wounds/wound vac. She has a flight on Saturday w/ lay overs.  Please advise.

## 2022-07-26 NOTE — Telephone Encounter (Signed)
Spoke with patient giving information per Dr Posey Pronto, said  that she will be traveling on Airplane w/o wound vac, will probably be in a wheelchair most of the time. She will come in on Thursday to pick up supplies.

## 2022-07-27 DIAGNOSIS — B957 Other staphylococcus as the cause of diseases classified elsewhere: Secondary | ICD-10-CM | POA: Diagnosis not present

## 2022-07-27 DIAGNOSIS — Z794 Long term (current) use of insulin: Secondary | ICD-10-CM | POA: Diagnosis not present

## 2022-07-27 DIAGNOSIS — Z981 Arthrodesis status: Secondary | ICD-10-CM | POA: Diagnosis not present

## 2022-07-27 DIAGNOSIS — M19042 Primary osteoarthritis, left hand: Secondary | ICD-10-CM | POA: Diagnosis not present

## 2022-07-27 DIAGNOSIS — G894 Chronic pain syndrome: Secondary | ICD-10-CM | POA: Diagnosis not present

## 2022-07-27 DIAGNOSIS — E11621 Type 2 diabetes mellitus with foot ulcer: Secondary | ICD-10-CM | POA: Diagnosis not present

## 2022-07-27 DIAGNOSIS — M545 Low back pain, unspecified: Secondary | ICD-10-CM | POA: Diagnosis not present

## 2022-07-27 DIAGNOSIS — L97518 Non-pressure chronic ulcer of other part of right foot with other specified severity: Secondary | ICD-10-CM | POA: Diagnosis not present

## 2022-07-27 DIAGNOSIS — M19041 Primary osteoarthritis, right hand: Secondary | ICD-10-CM | POA: Diagnosis not present

## 2022-07-27 DIAGNOSIS — Z9181 History of falling: Secondary | ICD-10-CM | POA: Diagnosis not present

## 2022-07-27 DIAGNOSIS — M109 Gout, unspecified: Secondary | ICD-10-CM | POA: Diagnosis not present

## 2022-07-27 DIAGNOSIS — Z79891 Long term (current) use of opiate analgesic: Secondary | ICD-10-CM | POA: Diagnosis not present

## 2022-07-27 DIAGNOSIS — E7800 Pure hypercholesterolemia, unspecified: Secondary | ICD-10-CM | POA: Diagnosis not present

## 2022-07-27 DIAGNOSIS — K5909 Other constipation: Secondary | ICD-10-CM | POA: Diagnosis not present

## 2022-07-27 DIAGNOSIS — E559 Vitamin D deficiency, unspecified: Secondary | ICD-10-CM | POA: Diagnosis not present

## 2022-07-27 DIAGNOSIS — E1169 Type 2 diabetes mellitus with other specified complication: Secondary | ICD-10-CM | POA: Diagnosis not present

## 2022-07-27 DIAGNOSIS — G43909 Migraine, unspecified, not intractable, without status migrainosus: Secondary | ICD-10-CM | POA: Diagnosis not present

## 2022-07-27 DIAGNOSIS — M86671 Other chronic osteomyelitis, right ankle and foot: Secondary | ICD-10-CM | POA: Diagnosis not present

## 2022-07-27 DIAGNOSIS — E1142 Type 2 diabetes mellitus with diabetic polyneuropathy: Secondary | ICD-10-CM | POA: Diagnosis not present

## 2022-07-27 DIAGNOSIS — F17210 Nicotine dependence, cigarettes, uncomplicated: Secondary | ICD-10-CM | POA: Diagnosis not present

## 2022-07-27 DIAGNOSIS — J449 Chronic obstructive pulmonary disease, unspecified: Secondary | ICD-10-CM | POA: Diagnosis not present

## 2022-07-27 DIAGNOSIS — E039 Hypothyroidism, unspecified: Secondary | ICD-10-CM | POA: Diagnosis not present

## 2022-07-27 DIAGNOSIS — E538 Deficiency of other specified B group vitamins: Secondary | ICD-10-CM | POA: Diagnosis not present

## 2022-07-27 DIAGNOSIS — I10 Essential (primary) hypertension: Secondary | ICD-10-CM | POA: Diagnosis not present

## 2022-07-28 NOTE — Telephone Encounter (Signed)
Patient is coming in today (around 2:00)to pick up supplies from front desk,flying out of town on Saturday.  She will need an order to discontinue the wound vac on 07/29/22,resume on 08/15/22.

## 2022-07-29 ENCOUNTER — Telehealth: Payer: Self-pay | Admitting: Podiatry

## 2022-07-29 DIAGNOSIS — E538 Deficiency of other specified B group vitamins: Secondary | ICD-10-CM | POA: Diagnosis not present

## 2022-07-29 DIAGNOSIS — B957 Other staphylococcus as the cause of diseases classified elsewhere: Secondary | ICD-10-CM | POA: Diagnosis not present

## 2022-07-29 DIAGNOSIS — E559 Vitamin D deficiency, unspecified: Secondary | ICD-10-CM | POA: Diagnosis not present

## 2022-07-29 DIAGNOSIS — Z79891 Long term (current) use of opiate analgesic: Secondary | ICD-10-CM | POA: Diagnosis not present

## 2022-07-29 DIAGNOSIS — M19042 Primary osteoarthritis, left hand: Secondary | ICD-10-CM | POA: Diagnosis not present

## 2022-07-29 DIAGNOSIS — F17210 Nicotine dependence, cigarettes, uncomplicated: Secondary | ICD-10-CM | POA: Diagnosis not present

## 2022-07-29 DIAGNOSIS — Z9181 History of falling: Secondary | ICD-10-CM | POA: Diagnosis not present

## 2022-07-29 DIAGNOSIS — K5909 Other constipation: Secondary | ICD-10-CM | POA: Diagnosis not present

## 2022-07-29 DIAGNOSIS — E039 Hypothyroidism, unspecified: Secondary | ICD-10-CM | POA: Diagnosis not present

## 2022-07-29 DIAGNOSIS — M86671 Other chronic osteomyelitis, right ankle and foot: Secondary | ICD-10-CM | POA: Diagnosis not present

## 2022-07-29 DIAGNOSIS — E7800 Pure hypercholesterolemia, unspecified: Secondary | ICD-10-CM | POA: Diagnosis not present

## 2022-07-29 DIAGNOSIS — Z794 Long term (current) use of insulin: Secondary | ICD-10-CM | POA: Diagnosis not present

## 2022-07-29 DIAGNOSIS — E1142 Type 2 diabetes mellitus with diabetic polyneuropathy: Secondary | ICD-10-CM | POA: Diagnosis not present

## 2022-07-29 DIAGNOSIS — L97518 Non-pressure chronic ulcer of other part of right foot with other specified severity: Secondary | ICD-10-CM | POA: Diagnosis not present

## 2022-07-29 DIAGNOSIS — E1169 Type 2 diabetes mellitus with other specified complication: Secondary | ICD-10-CM | POA: Diagnosis not present

## 2022-07-29 DIAGNOSIS — E11621 Type 2 diabetes mellitus with foot ulcer: Secondary | ICD-10-CM | POA: Diagnosis not present

## 2022-07-29 DIAGNOSIS — I10 Essential (primary) hypertension: Secondary | ICD-10-CM | POA: Diagnosis not present

## 2022-07-29 DIAGNOSIS — G43909 Migraine, unspecified, not intractable, without status migrainosus: Secondary | ICD-10-CM | POA: Diagnosis not present

## 2022-07-29 DIAGNOSIS — M19041 Primary osteoarthritis, right hand: Secondary | ICD-10-CM | POA: Diagnosis not present

## 2022-07-29 DIAGNOSIS — J449 Chronic obstructive pulmonary disease, unspecified: Secondary | ICD-10-CM | POA: Diagnosis not present

## 2022-07-29 DIAGNOSIS — M545 Low back pain, unspecified: Secondary | ICD-10-CM | POA: Diagnosis not present

## 2022-07-29 DIAGNOSIS — G894 Chronic pain syndrome: Secondary | ICD-10-CM | POA: Diagnosis not present

## 2022-07-29 DIAGNOSIS — Z981 Arthrodesis status: Secondary | ICD-10-CM | POA: Diagnosis not present

## 2022-07-29 DIAGNOSIS — M109 Gout, unspecified: Secondary | ICD-10-CM | POA: Diagnosis not present

## 2022-07-29 NOTE — Telephone Encounter (Signed)
Patient called said Dr Posey Pronto was to call her a antibiotic into the [facility] in [place] and it has not been called in as of yet ?  She needs this sent today because she is going out of town tomorrow

## 2022-07-30 ENCOUNTER — Other Ambulatory Visit (INDEPENDENT_AMBULATORY_CARE_PROVIDER_SITE_OTHER): Payer: Medicare Other | Admitting: Podiatry

## 2022-07-30 DIAGNOSIS — E11621 Type 2 diabetes mellitus with foot ulcer: Secondary | ICD-10-CM

## 2022-07-30 DIAGNOSIS — L02611 Cutaneous abscess of right foot: Secondary | ICD-10-CM | POA: Diagnosis not present

## 2022-07-30 DIAGNOSIS — E08621 Diabetes mellitus due to underlying condition with foot ulcer: Secondary | ICD-10-CM | POA: Diagnosis not present

## 2022-07-30 DIAGNOSIS — L97412 Non-pressure chronic ulcer of right heel and midfoot with fat layer exposed: Secondary | ICD-10-CM

## 2022-07-30 DIAGNOSIS — M86671 Other chronic osteomyelitis, right ankle and foot: Secondary | ICD-10-CM | POA: Diagnosis not present

## 2022-07-30 MED ORDER — DOXYCYCLINE HYCLATE 100 MG PO TABS: 100.0000 mg | ORAL_TABLET | Freq: Two times a day (BID) | ORAL | 0 refills | Status: AC

## 2022-07-30 NOTE — Progress Notes (Signed)
Spoke to patient on phone this AM, she did state was supposed to have abx sent, not sure what one as Dr. Posey Pronto did not mention but has been on doxycyline in past and thinks that is probably what was sent. Sent rx for doxycyline 100 mg BID x 10 days. PT stated understanding and appreciation for abx.

## 2022-07-31 DIAGNOSIS — M86671 Other chronic osteomyelitis, right ankle and foot: Secondary | ICD-10-CM | POA: Diagnosis not present

## 2022-08-01 NOTE — Telephone Encounter (Signed)
Katherine Herrera w/ home health is requesting wound vac restart order on 10/16 or when patient returns from out of town.

## 2022-08-08 DIAGNOSIS — E1165 Type 2 diabetes mellitus with hyperglycemia: Secondary | ICD-10-CM | POA: Diagnosis not present

## 2022-08-10 DIAGNOSIS — R0602 Shortness of breath: Secondary | ICD-10-CM | POA: Diagnosis not present

## 2022-08-10 DIAGNOSIS — D72829 Elevated white blood cell count, unspecified: Secondary | ICD-10-CM | POA: Diagnosis not present

## 2022-08-10 DIAGNOSIS — I4510 Unspecified right bundle-branch block: Secondary | ICD-10-CM | POA: Diagnosis not present

## 2022-08-10 DIAGNOSIS — R079 Chest pain, unspecified: Secondary | ICD-10-CM | POA: Diagnosis not present

## 2022-08-10 DIAGNOSIS — Z20822 Contact with and (suspected) exposure to covid-19: Secondary | ICD-10-CM | POA: Diagnosis not present

## 2022-08-10 DIAGNOSIS — L97519 Non-pressure chronic ulcer of other part of right foot with unspecified severity: Secondary | ICD-10-CM | POA: Diagnosis not present

## 2022-08-10 DIAGNOSIS — E785 Hyperlipidemia, unspecified: Secondary | ICD-10-CM | POA: Diagnosis not present

## 2022-08-10 DIAGNOSIS — Z794 Long term (current) use of insulin: Secondary | ICD-10-CM | POA: Diagnosis not present

## 2022-08-10 DIAGNOSIS — R110 Nausea: Secondary | ICD-10-CM | POA: Diagnosis not present

## 2022-08-10 DIAGNOSIS — E1140 Type 2 diabetes mellitus with diabetic neuropathy, unspecified: Secondary | ICD-10-CM | POA: Diagnosis not present

## 2022-08-10 DIAGNOSIS — R0789 Other chest pain: Secondary | ICD-10-CM | POA: Diagnosis not present

## 2022-08-10 DIAGNOSIS — R109 Unspecified abdominal pain: Secondary | ICD-10-CM | POA: Diagnosis not present

## 2022-08-10 DIAGNOSIS — Z87891 Personal history of nicotine dependence: Secondary | ICD-10-CM | POA: Diagnosis not present

## 2022-08-10 DIAGNOSIS — R0902 Hypoxemia: Secondary | ICD-10-CM | POA: Diagnosis not present

## 2022-08-10 DIAGNOSIS — R1012 Left upper quadrant pain: Secondary | ICD-10-CM | POA: Diagnosis not present

## 2022-08-10 DIAGNOSIS — Z029 Encounter for administrative examinations, unspecified: Secondary | ICD-10-CM | POA: Diagnosis not present

## 2022-08-10 DIAGNOSIS — G2581 Restless legs syndrome: Secondary | ICD-10-CM | POA: Diagnosis not present

## 2022-08-10 DIAGNOSIS — E039 Hypothyroidism, unspecified: Secondary | ICD-10-CM | POA: Diagnosis not present

## 2022-08-10 DIAGNOSIS — E11621 Type 2 diabetes mellitus with foot ulcer: Secondary | ICD-10-CM | POA: Diagnosis not present

## 2022-08-10 DIAGNOSIS — H524 Presbyopia: Secondary | ICD-10-CM | POA: Diagnosis not present

## 2022-08-10 DIAGNOSIS — R269 Unspecified abnormalities of gait and mobility: Secondary | ICD-10-CM | POA: Diagnosis not present

## 2022-08-10 DIAGNOSIS — R42 Dizziness and giddiness: Secondary | ICD-10-CM | POA: Diagnosis not present

## 2022-08-10 DIAGNOSIS — H52223 Regular astigmatism, bilateral: Secondary | ICD-10-CM | POA: Diagnosis not present

## 2022-08-10 DIAGNOSIS — Z7982 Long term (current) use of aspirin: Secondary | ICD-10-CM | POA: Diagnosis not present

## 2022-08-10 DIAGNOSIS — J9691 Respiratory failure, unspecified with hypoxia: Secondary | ICD-10-CM | POA: Diagnosis not present

## 2022-08-10 DIAGNOSIS — J9601 Acute respiratory failure with hypoxia: Secondary | ICD-10-CM | POA: Diagnosis not present

## 2022-08-10 DIAGNOSIS — I10 Essential (primary) hypertension: Secondary | ICD-10-CM | POA: Diagnosis not present

## 2022-08-10 DIAGNOSIS — Z79899 Other long term (current) drug therapy: Secondary | ICD-10-CM | POA: Diagnosis not present

## 2022-08-10 DIAGNOSIS — I4519 Other right bundle-branch block: Secondary | ICD-10-CM | POA: Diagnosis not present

## 2022-08-15 ENCOUNTER — Telehealth: Payer: Self-pay | Admitting: *Deleted

## 2022-08-15 NOTE — Telephone Encounter (Addendum)
Patient is calling to have wound vac restarted today. Called Otila Kluver [phone]) and left voice message for verbal order to restart. Called and spoke with manager w/ [facility],Carol([place]) to give verbal order as well but said that since they have discharged the patient, needs to be seen first before the restart , making sure this is still appropriate for patient at this time.Upcoming appointment on 08/17/22.  Spoke with patient to explain.

## 2022-08-17 ENCOUNTER — Encounter: Payer: Self-pay | Admitting: Podiatry

## 2022-08-17 ENCOUNTER — Ambulatory Visit (INDEPENDENT_AMBULATORY_CARE_PROVIDER_SITE_OTHER): Payer: Medicare Other | Admitting: Podiatry

## 2022-08-17 DIAGNOSIS — L97412 Non-pressure chronic ulcer of right heel and midfoot with fat layer exposed: Secondary | ICD-10-CM | POA: Diagnosis not present

## 2022-08-17 DIAGNOSIS — E1142 Type 2 diabetes mellitus with diabetic polyneuropathy: Secondary | ICD-10-CM | POA: Diagnosis not present

## 2022-08-17 DIAGNOSIS — E11621 Type 2 diabetes mellitus with foot ulcer: Secondary | ICD-10-CM

## 2022-08-17 NOTE — Telephone Encounter (Signed)
Faxed office notes,updated wound care orders, restart the wound vac/ demographics to [facility] w/ [facility], confirmation received 08/17/22. Notified nurse by phone as well.

## 2022-08-17 NOTE — Progress Notes (Signed)
Subjective:  Patient ID: Katherine Herrera, female    DOB: 07/06/1966,  MRN: [id number]  Chief Complaint  Patient presents with   Foot Ulcer    DOS: 04/13/2022 Procedure: Right foot revisional I&D washout with Integra application/VAC  56 y.o. female returns for post-op check.  Patient states that she is doing okay.  The VAC is functioning well.  Granular wound bed noted.  The wound size is decreasing the wound VAC seems to be doing really good.  No acute complaints. Review of Systems: Negative except as noted in the HPI. Denies N/V/F/Ch.  Past Medical History:  Diagnosis Date   Anxiety    Arthritis    hands   B12 deficiency    Chronic constipation    Chronic pain syndrome    neck, lower back, and foot   COPD (chronic obstructive pulmonary disease) ([place])    Diabetic ulcer of right foot ([place])    Gout    possible   History of seizure    05-26-2020  per pt as child and last one in late teen's   History of sepsis    due to lower extremity cellulits 2019 left , right 03/ 2021   History of syncope    Hyperlipidemia    Hypothyroidism    Impaired range of motion of cervical spine    per pt hx cervical fusion C4 -- 7,  has to be careful about turning her head to the right can cause her to pass out   Insulin dependent type 2 diabetes mellitus [facility])    endocrinologist--- dr Chalmers Cater    (05-26-2020 per pt check's blood sugar twice dialy and at times a third time,  fasting am blood sugar's---- 200 -- 250)   LBP (low back pain)    DR Patrice Paradise   Migraine    Peripheral neuropathy    RBBB (right bundle branch block)    Tenosynovitis of foot and ankle 09/03/2013   TTS (tarsal tunnel syndrome) 2010   Left Foot [place] in [place]   Vitamin D deficiency    Wears glasses     Current Outpatient Medications:    B-D INS SYR ULTRAFINE 1CC/31G 31G X 5/16" 1 ML MISC, USE AS DIRECTED, Disp: 100 each, Rfl: 0   baclofen (LIORESAL) 10 MG tablet, TAKE 2 TABLETS BY MOUTH TWICE  DAILY, Disp: 180 tablet, Rfl: 1    bisacodyl (DULCOLAX) 5 MG EC tablet, Take 10 mg by mouth at bedtime., Disp: , Rfl:    Blood Glucose Monitoring Suppl (ONETOUCH VERIO) w/Device KIT, , Disp: , Rfl:    cholecalciferol (VITAMIN D3) 25 MCG (1000 UNIT) tablet, Take 1,000 Units by mouth daily., Disp: , Rfl:    clonazePAM (KLONOPIN) 0.5 MG tablet, Take 1 tablet (0.5 mg total) by mouth 2 (two) times daily as needed for up to 5 days for anxiety., Disp: 60 tablet, Rfl: 1   colchicine 0.6 MG tablet, TAKE 2 TABLETS AS NEEDED GOUT ATTACK. THEN TAKE ANOTHER 1 TAB IN 1-2 HOURS. DONT REPEAT FOR 3 DAYS. (Patient taking differently: Take 0.6 mg by mouth daily as needed (gout).), Disp: 18 tablet, Rfl: 1   docusate sodium (COLACE) 100 MG capsule, Take 100 mg by mouth at bedtime., Disp: , Rfl:    ezetimibe (ZETIA) 10 MG tablet, TAKE 1 TABLET BY MOUTH DAILY, Disp: 100 tablet, Rfl: 2   FLUoxetine (PROZAC) 10 MG capsule, Take 1 capsule (10 mg total) by mouth daily., Disp: 90 capsule, Rfl: 3   gabapentin (NEURONTIN) 800 MG  tablet, TAKE 1 TABLET BY MOUTH 4  TIMES DAILY (Patient taking differently: Take 800 mg by mouth 3 (three) times daily.), Disp: 360 tablet, Rfl: 3   insulin NPH-regular Human (70-30) 100 UNIT/ML injection, Inject 20 Units into the skin 2 (two) times daily with a meal. (Patient taking differently: Inject 30-40 Units into the skin See admin instructions. 30 units in the morning and afternoon and 40 units at night), Disp: 10 mL, Rfl: 11   lamoTRIgine (LAMICTAL) 25 MG tablet, TAKE 1 TABLET BY MOUTH  TWICE DAILY, Disp: 180 tablet, Rfl: 1   Lancet Devices (ACCU-CHEK SOFTCLIX) lancets, Test two times daily, Disp: 100 each, Rfl: 12   Lancets (ONETOUCH DELICA PLUS [id number]) MISC, , Disp: , Rfl:    levothyroxine (SYNTHROID) 75 MCG tablet, TAKE 1 TABLET BY MOUTH  DAILY (Patient taking differently: Take 75 mcg by mouth daily before breakfast.), Disp: 90 tablet, Rfl: 3   lisinopril (ZESTRIL) 10 MG tablet, TAKE 1 TABLET BY MOUTH DAILY, Disp: 100 tablet,  Rfl: 2   loratadine (CLARITIN) 10 MG tablet, Take 1 tablet (10 mg total) by mouth daily as needed for allergies., Disp: 100 tablet, Rfl: 3   ondansetron (ZOFRAN) 4 MG tablet, Take 4 mg by mouth every 8 (eight) hours as needed for nausea or vomiting., Disp: , Rfl:    oxyCODONE (ROXICODONE) 15 MG immediate release tablet, Take 1 tablet (15 mg total) by mouth every 6 (six) hours as needed for pain., Disp: 120 tablet, Rfl: 0   oxyCODONE (ROXICODONE) 15 MG immediate release tablet, Take 1 tablet (15 mg total) by mouth every 6 (six) hours as needed for pain., Disp: 120 tablet, Rfl: 0   rOPINIRole (REQUIP) 0.5 MG tablet, TAKE 1 TABLET BY MOUTH AT  BEDTIME (Patient taking differently: Take 0.5 mg by mouth daily.), Disp: 90 tablet, Rfl: 3   silver sulfADIAZINE (SILVADENE) 1 % cream, APPLY PEA-SIZED AMOUNT TO WOUND DAILY., Disp: 50 g, Rfl: 0   vitamin B-12 (CYANOCOBALAMIN) 1000 MCG tablet, Take 1,000 mcg by mouth daily., Disp: , Rfl:   Social History   Tobacco Use  Smoking Status Every Day   Packs/day: 1.00   Years: 10.00   Total pack years: 10.00   Types: Cigarettes  Smokeless Tobacco Never  Tobacco Comments   Trying to quit, smoking 1 ppd    Allergies  Allergen Reactions   Cephalexin Nausea And Vomiting   Demerol  [Meperidine Hcl] Rash   Lovastatin Other (See Comments)    Possible myalgia    Metformin And Related Nausea And Vomiting   Sulfamethoxazole-Trimethoprim     Other reaction(s): Unknown DILI, pancreatitis   Crestor [Rosuvastatin Calcium]     myalgia   Hydromorphone     Other reaction(s): Unknown   Niacin Other (See Comments)    Unknown    Other Other (See Comments)   Hydromorphone Hcl Itching    Patient has been tolerating Hydromorphone tablets without adverse effect (07/09/19) Other reaction(s): Unknown   Paroxetine Other (See Comments)    Unknown    Objective:  There were no vitals filed for this visit. There is no height or weight on file to calculate  BMI. Constitutional Well developed. Well nourished.  Vascular Foot warm and well perfused. Capillary refill normal to all digits.   Neurologic Normal speech. Oriented to person, place, and time. Epicritic sensation to light touch grossly present bilaterally.  Dermatologic Granular wound bed noted to the right foot.  Debridement was carried out minimally.  No purulent drainage  noted slight malodor present likely due to maceration  Orthopedic: Tenderness to palpation noted about the surgical site.   Radiographs: None Assessment:   1. Diabetic ulcer of right midfoot associated with type 2 diabetes mellitus, with fat layer exposed ([place])   2. DM type 2 with diabetic peripheral neuropathy ([place])       Plan:  Patient was evaluated and treated and all questions answered.  S/p foot surgery right -Progressing as expected post-operatively. -XR: See above -WB Status: Partial weightbearing to the heel/nonweightbearing in walker -Granular wound bed noted.  Wound size is decreasing. -Medications: None -Reapply the VAC today.  Continue Betadine dressing until then. -Minimal debridement was carried out in standard technique.  No complication noted.  No follow-ups on file.

## 2022-08-18 DIAGNOSIS — M86671 Other chronic osteomyelitis, right ankle and foot: Secondary | ICD-10-CM | POA: Diagnosis not present

## 2022-08-18 DIAGNOSIS — L02611 Cutaneous abscess of right foot: Secondary | ICD-10-CM | POA: Diagnosis not present

## 2022-08-18 DIAGNOSIS — E08621 Diabetes mellitus due to underlying condition with foot ulcer: Secondary | ICD-10-CM | POA: Diagnosis not present

## 2022-08-22 ENCOUNTER — Telehealth: Payer: Self-pay

## 2022-08-23 ENCOUNTER — Telehealth: Payer: Self-pay | Admitting: *Deleted

## 2022-08-23 NOTE — Addendum Note (Signed)
Addended byDeidre Ala, Dakin Madani L on: 08/23/2022 12:20 PM   Modules accepted: Orders

## 2022-08-23 NOTE — Telephone Encounter (Signed)
Refaxed order/ office notes to resume wound vac for patient, Attn: Arbie Cookey, confirmation received,said that  they did not receive., also spoke with nurse Otila Kluver giving her a verbal order per physician to resume the wound vac on 08/17/22.

## 2022-08-23 NOTE — Telephone Encounter (Signed)
Refaxed order to resume wound vac to ATTN: Junie Panning

## 2022-08-23 NOTE — Telephone Encounter (Signed)
Message was routed to appropriate staff to be addressed. No further evaluation is needed on my end.

## 2022-08-24 ENCOUNTER — Telehealth: Payer: Self-pay | Admitting: *Deleted

## 2022-08-24 NOTE — Telephone Encounter (Signed)
Contacted patient to notify that the order to resume wound vac has been given verbally and has been resent to [facility] , attn: Arbie Cookey, called patient and she said that they had contacted her and Otila Kluver, nurse will be out tomorrow to set up wound vac.

## 2022-08-24 NOTE — Telephone Encounter (Signed)
Order was refaxed to their office , attn: Carol,will contact patient for scheduling

## 2022-08-24 NOTE — Telephone Encounter (Signed)
Pt called back and states that she spoke to [facility] who advised her to call us to get the order for the wound vac faxed over to them. They have not received the order. I explained that a verbal order was given to the nurse, Otila Kluver on yesterday. Pt states that she will wait for them to call her back.  Please advise

## 2022-08-25 DIAGNOSIS — M1990 Unspecified osteoarthritis, unspecified site: Secondary | ICD-10-CM | POA: Diagnosis not present

## 2022-08-25 DIAGNOSIS — Z7982 Long term (current) use of aspirin: Secondary | ICD-10-CM | POA: Diagnosis not present

## 2022-08-25 DIAGNOSIS — J449 Chronic obstructive pulmonary disease, unspecified: Secondary | ICD-10-CM | POA: Diagnosis not present

## 2022-08-25 DIAGNOSIS — K5909 Other constipation: Secondary | ICD-10-CM | POA: Diagnosis not present

## 2022-08-25 DIAGNOSIS — E785 Hyperlipidemia, unspecified: Secondary | ICD-10-CM | POA: Diagnosis not present

## 2022-08-25 DIAGNOSIS — E559 Vitamin D deficiency, unspecified: Secondary | ICD-10-CM | POA: Diagnosis not present

## 2022-08-25 DIAGNOSIS — M545 Low back pain, unspecified: Secondary | ICD-10-CM | POA: Diagnosis not present

## 2022-08-25 DIAGNOSIS — F17210 Nicotine dependence, cigarettes, uncomplicated: Secondary | ICD-10-CM | POA: Diagnosis not present

## 2022-08-25 DIAGNOSIS — Z794 Long term (current) use of insulin: Secondary | ICD-10-CM | POA: Diagnosis not present

## 2022-08-25 DIAGNOSIS — G43909 Migraine, unspecified, not intractable, without status migrainosus: Secondary | ICD-10-CM | POA: Diagnosis not present

## 2022-08-25 DIAGNOSIS — E11621 Type 2 diabetes mellitus with foot ulcer: Secondary | ICD-10-CM | POA: Diagnosis not present

## 2022-08-25 DIAGNOSIS — Z79891 Long term (current) use of opiate analgesic: Secondary | ICD-10-CM | POA: Diagnosis not present

## 2022-08-25 DIAGNOSIS — E039 Hypothyroidism, unspecified: Secondary | ICD-10-CM | POA: Diagnosis not present

## 2022-08-25 DIAGNOSIS — I4510 Unspecified right bundle-branch block: Secondary | ICD-10-CM | POA: Diagnosis not present

## 2022-08-25 DIAGNOSIS — G894 Chronic pain syndrome: Secondary | ICD-10-CM | POA: Diagnosis not present

## 2022-08-25 DIAGNOSIS — E538 Deficiency of other specified B group vitamins: Secondary | ICD-10-CM | POA: Diagnosis not present

## 2022-08-25 DIAGNOSIS — L97412 Non-pressure chronic ulcer of right heel and midfoot with fat layer exposed: Secondary | ICD-10-CM | POA: Diagnosis not present

## 2022-08-25 DIAGNOSIS — E1142 Type 2 diabetes mellitus with diabetic polyneuropathy: Secondary | ICD-10-CM | POA: Diagnosis not present

## 2022-08-27 DIAGNOSIS — E538 Deficiency of other specified B group vitamins: Secondary | ICD-10-CM | POA: Diagnosis not present

## 2022-08-27 DIAGNOSIS — K5909 Other constipation: Secondary | ICD-10-CM | POA: Diagnosis not present

## 2022-08-27 DIAGNOSIS — M545 Low back pain, unspecified: Secondary | ICD-10-CM | POA: Diagnosis not present

## 2022-08-27 DIAGNOSIS — E785 Hyperlipidemia, unspecified: Secondary | ICD-10-CM | POA: Diagnosis not present

## 2022-08-27 DIAGNOSIS — F17210 Nicotine dependence, cigarettes, uncomplicated: Secondary | ICD-10-CM | POA: Diagnosis not present

## 2022-08-27 DIAGNOSIS — Z794 Long term (current) use of insulin: Secondary | ICD-10-CM | POA: Diagnosis not present

## 2022-08-27 DIAGNOSIS — E1142 Type 2 diabetes mellitus with diabetic polyneuropathy: Secondary | ICD-10-CM | POA: Diagnosis not present

## 2022-08-27 DIAGNOSIS — E559 Vitamin D deficiency, unspecified: Secondary | ICD-10-CM | POA: Diagnosis not present

## 2022-08-27 DIAGNOSIS — Z7982 Long term (current) use of aspirin: Secondary | ICD-10-CM | POA: Diagnosis not present

## 2022-08-27 DIAGNOSIS — I4510 Unspecified right bundle-branch block: Secondary | ICD-10-CM | POA: Diagnosis not present

## 2022-08-27 DIAGNOSIS — E039 Hypothyroidism, unspecified: Secondary | ICD-10-CM | POA: Diagnosis not present

## 2022-08-27 DIAGNOSIS — E11621 Type 2 diabetes mellitus with foot ulcer: Secondary | ICD-10-CM | POA: Diagnosis not present

## 2022-08-27 DIAGNOSIS — L97412 Non-pressure chronic ulcer of right heel and midfoot with fat layer exposed: Secondary | ICD-10-CM | POA: Diagnosis not present

## 2022-08-27 DIAGNOSIS — G894 Chronic pain syndrome: Secondary | ICD-10-CM | POA: Diagnosis not present

## 2022-08-27 DIAGNOSIS — G43909 Migraine, unspecified, not intractable, without status migrainosus: Secondary | ICD-10-CM | POA: Diagnosis not present

## 2022-08-27 DIAGNOSIS — Z79891 Long term (current) use of opiate analgesic: Secondary | ICD-10-CM | POA: Diagnosis not present

## 2022-08-27 DIAGNOSIS — J449 Chronic obstructive pulmonary disease, unspecified: Secondary | ICD-10-CM | POA: Diagnosis not present

## 2022-08-27 DIAGNOSIS — M1990 Unspecified osteoarthritis, unspecified site: Secondary | ICD-10-CM | POA: Diagnosis not present

## 2022-08-29 DIAGNOSIS — Z794 Long term (current) use of insulin: Secondary | ICD-10-CM | POA: Diagnosis not present

## 2022-08-29 DIAGNOSIS — K5909 Other constipation: Secondary | ICD-10-CM | POA: Diagnosis not present

## 2022-08-29 DIAGNOSIS — E785 Hyperlipidemia, unspecified: Secondary | ICD-10-CM | POA: Diagnosis not present

## 2022-08-29 DIAGNOSIS — E039 Hypothyroidism, unspecified: Secondary | ICD-10-CM | POA: Diagnosis not present

## 2022-08-29 DIAGNOSIS — I4510 Unspecified right bundle-branch block: Secondary | ICD-10-CM | POA: Diagnosis not present

## 2022-08-29 DIAGNOSIS — L97412 Non-pressure chronic ulcer of right heel and midfoot with fat layer exposed: Secondary | ICD-10-CM | POA: Diagnosis not present

## 2022-08-29 DIAGNOSIS — G43909 Migraine, unspecified, not intractable, without status migrainosus: Secondary | ICD-10-CM | POA: Diagnosis not present

## 2022-08-29 DIAGNOSIS — M86671 Other chronic osteomyelitis, right ankle and foot: Secondary | ICD-10-CM | POA: Diagnosis not present

## 2022-08-29 DIAGNOSIS — E08621 Diabetes mellitus due to underlying condition with foot ulcer: Secondary | ICD-10-CM | POA: Diagnosis not present

## 2022-08-29 DIAGNOSIS — Z7982 Long term (current) use of aspirin: Secondary | ICD-10-CM | POA: Diagnosis not present

## 2022-08-29 DIAGNOSIS — M1990 Unspecified osteoarthritis, unspecified site: Secondary | ICD-10-CM | POA: Diagnosis not present

## 2022-08-29 DIAGNOSIS — G894 Chronic pain syndrome: Secondary | ICD-10-CM | POA: Diagnosis not present

## 2022-08-29 DIAGNOSIS — E538 Deficiency of other specified B group vitamins: Secondary | ICD-10-CM | POA: Diagnosis not present

## 2022-08-29 DIAGNOSIS — L02611 Cutaneous abscess of right foot: Secondary | ICD-10-CM | POA: Diagnosis not present

## 2022-08-29 DIAGNOSIS — E11621 Type 2 diabetes mellitus with foot ulcer: Secondary | ICD-10-CM | POA: Diagnosis not present

## 2022-08-29 DIAGNOSIS — J449 Chronic obstructive pulmonary disease, unspecified: Secondary | ICD-10-CM | POA: Diagnosis not present

## 2022-08-29 DIAGNOSIS — E559 Vitamin D deficiency, unspecified: Secondary | ICD-10-CM | POA: Diagnosis not present

## 2022-08-29 DIAGNOSIS — F17210 Nicotine dependence, cigarettes, uncomplicated: Secondary | ICD-10-CM | POA: Diagnosis not present

## 2022-08-29 DIAGNOSIS — M545 Low back pain, unspecified: Secondary | ICD-10-CM | POA: Diagnosis not present

## 2022-08-29 DIAGNOSIS — E1142 Type 2 diabetes mellitus with diabetic polyneuropathy: Secondary | ICD-10-CM | POA: Diagnosis not present

## 2022-08-29 DIAGNOSIS — Z79891 Long term (current) use of opiate analgesic: Secondary | ICD-10-CM | POA: Diagnosis not present

## 2022-08-31 DIAGNOSIS — M1990 Unspecified osteoarthritis, unspecified site: Secondary | ICD-10-CM | POA: Diagnosis not present

## 2022-08-31 DIAGNOSIS — Z7982 Long term (current) use of aspirin: Secondary | ICD-10-CM | POA: Diagnosis not present

## 2022-08-31 DIAGNOSIS — G43909 Migraine, unspecified, not intractable, without status migrainosus: Secondary | ICD-10-CM | POA: Diagnosis not present

## 2022-08-31 DIAGNOSIS — Z79891 Long term (current) use of opiate analgesic: Secondary | ICD-10-CM | POA: Diagnosis not present

## 2022-08-31 DIAGNOSIS — F17210 Nicotine dependence, cigarettes, uncomplicated: Secondary | ICD-10-CM | POA: Diagnosis not present

## 2022-08-31 DIAGNOSIS — J449 Chronic obstructive pulmonary disease, unspecified: Secondary | ICD-10-CM | POA: Diagnosis not present

## 2022-08-31 DIAGNOSIS — G894 Chronic pain syndrome: Secondary | ICD-10-CM | POA: Diagnosis not present

## 2022-08-31 DIAGNOSIS — I4510 Unspecified right bundle-branch block: Secondary | ICD-10-CM | POA: Diagnosis not present

## 2022-08-31 DIAGNOSIS — L97412 Non-pressure chronic ulcer of right heel and midfoot with fat layer exposed: Secondary | ICD-10-CM | POA: Diagnosis not present

## 2022-08-31 DIAGNOSIS — K5909 Other constipation: Secondary | ICD-10-CM | POA: Diagnosis not present

## 2022-08-31 DIAGNOSIS — M545 Low back pain, unspecified: Secondary | ICD-10-CM | POA: Diagnosis not present

## 2022-08-31 DIAGNOSIS — E039 Hypothyroidism, unspecified: Secondary | ICD-10-CM | POA: Diagnosis not present

## 2022-08-31 DIAGNOSIS — E11621 Type 2 diabetes mellitus with foot ulcer: Secondary | ICD-10-CM | POA: Diagnosis not present

## 2022-08-31 DIAGNOSIS — E538 Deficiency of other specified B group vitamins: Secondary | ICD-10-CM | POA: Diagnosis not present

## 2022-08-31 DIAGNOSIS — Z794 Long term (current) use of insulin: Secondary | ICD-10-CM | POA: Diagnosis not present

## 2022-08-31 DIAGNOSIS — E785 Hyperlipidemia, unspecified: Secondary | ICD-10-CM | POA: Diagnosis not present

## 2022-08-31 DIAGNOSIS — E559 Vitamin D deficiency, unspecified: Secondary | ICD-10-CM | POA: Diagnosis not present

## 2022-08-31 DIAGNOSIS — E1142 Type 2 diabetes mellitus with diabetic polyneuropathy: Secondary | ICD-10-CM | POA: Diagnosis not present

## 2022-09-02 ENCOUNTER — Telehealth: Payer: Self-pay | Admitting: *Deleted

## 2022-09-02 DIAGNOSIS — Z7982 Long term (current) use of aspirin: Secondary | ICD-10-CM | POA: Diagnosis not present

## 2022-09-02 DIAGNOSIS — E039 Hypothyroidism, unspecified: Secondary | ICD-10-CM | POA: Diagnosis not present

## 2022-09-02 DIAGNOSIS — Z794 Long term (current) use of insulin: Secondary | ICD-10-CM | POA: Diagnosis not present

## 2022-09-02 DIAGNOSIS — L97412 Non-pressure chronic ulcer of right heel and midfoot with fat layer exposed: Secondary | ICD-10-CM | POA: Diagnosis not present

## 2022-09-02 DIAGNOSIS — Z79891 Long term (current) use of opiate analgesic: Secondary | ICD-10-CM | POA: Diagnosis not present

## 2022-09-02 DIAGNOSIS — J449 Chronic obstructive pulmonary disease, unspecified: Secondary | ICD-10-CM | POA: Diagnosis not present

## 2022-09-02 DIAGNOSIS — E538 Deficiency of other specified B group vitamins: Secondary | ICD-10-CM | POA: Diagnosis not present

## 2022-09-02 DIAGNOSIS — I4510 Unspecified right bundle-branch block: Secondary | ICD-10-CM | POA: Diagnosis not present

## 2022-09-02 DIAGNOSIS — E1142 Type 2 diabetes mellitus with diabetic polyneuropathy: Secondary | ICD-10-CM | POA: Diagnosis not present

## 2022-09-02 DIAGNOSIS — E11621 Type 2 diabetes mellitus with foot ulcer: Secondary | ICD-10-CM | POA: Diagnosis not present

## 2022-09-02 DIAGNOSIS — G43909 Migraine, unspecified, not intractable, without status migrainosus: Secondary | ICD-10-CM | POA: Diagnosis not present

## 2022-09-02 DIAGNOSIS — E559 Vitamin D deficiency, unspecified: Secondary | ICD-10-CM | POA: Diagnosis not present

## 2022-09-02 DIAGNOSIS — M545 Low back pain, unspecified: Secondary | ICD-10-CM | POA: Diagnosis not present

## 2022-09-02 DIAGNOSIS — F17210 Nicotine dependence, cigarettes, uncomplicated: Secondary | ICD-10-CM | POA: Diagnosis not present

## 2022-09-02 DIAGNOSIS — M1990 Unspecified osteoarthritis, unspecified site: Secondary | ICD-10-CM | POA: Diagnosis not present

## 2022-09-02 DIAGNOSIS — K5909 Other constipation: Secondary | ICD-10-CM | POA: Diagnosis not present

## 2022-09-02 DIAGNOSIS — E785 Hyperlipidemia, unspecified: Secondary | ICD-10-CM | POA: Diagnosis not present

## 2022-09-02 DIAGNOSIS — G894 Chronic pain syndrome: Secondary | ICD-10-CM | POA: Diagnosis not present

## 2022-09-02 NOTE — Telephone Encounter (Signed)
error 

## 2022-09-04 ENCOUNTER — Other Ambulatory Visit: Payer: Self-pay | Admitting: Internal Medicine

## 2022-09-05 DIAGNOSIS — G43909 Migraine, unspecified, not intractable, without status migrainosus: Secondary | ICD-10-CM | POA: Diagnosis not present

## 2022-09-05 DIAGNOSIS — F17210 Nicotine dependence, cigarettes, uncomplicated: Secondary | ICD-10-CM | POA: Diagnosis not present

## 2022-09-05 DIAGNOSIS — G894 Chronic pain syndrome: Secondary | ICD-10-CM | POA: Diagnosis not present

## 2022-09-05 DIAGNOSIS — E559 Vitamin D deficiency, unspecified: Secondary | ICD-10-CM | POA: Diagnosis not present

## 2022-09-05 DIAGNOSIS — L97412 Non-pressure chronic ulcer of right heel and midfoot with fat layer exposed: Secondary | ICD-10-CM | POA: Diagnosis not present

## 2022-09-05 DIAGNOSIS — Z794 Long term (current) use of insulin: Secondary | ICD-10-CM | POA: Diagnosis not present

## 2022-09-05 DIAGNOSIS — J449 Chronic obstructive pulmonary disease, unspecified: Secondary | ICD-10-CM | POA: Diagnosis not present

## 2022-09-05 DIAGNOSIS — E1142 Type 2 diabetes mellitus with diabetic polyneuropathy: Secondary | ICD-10-CM | POA: Diagnosis not present

## 2022-09-05 DIAGNOSIS — E538 Deficiency of other specified B group vitamins: Secondary | ICD-10-CM | POA: Diagnosis not present

## 2022-09-05 DIAGNOSIS — M545 Low back pain, unspecified: Secondary | ICD-10-CM | POA: Diagnosis not present

## 2022-09-05 DIAGNOSIS — M1990 Unspecified osteoarthritis, unspecified site: Secondary | ICD-10-CM | POA: Diagnosis not present

## 2022-09-05 DIAGNOSIS — I4510 Unspecified right bundle-branch block: Secondary | ICD-10-CM | POA: Diagnosis not present

## 2022-09-05 DIAGNOSIS — E785 Hyperlipidemia, unspecified: Secondary | ICD-10-CM | POA: Diagnosis not present

## 2022-09-05 DIAGNOSIS — E039 Hypothyroidism, unspecified: Secondary | ICD-10-CM | POA: Diagnosis not present

## 2022-09-05 DIAGNOSIS — E11621 Type 2 diabetes mellitus with foot ulcer: Secondary | ICD-10-CM | POA: Diagnosis not present

## 2022-09-05 DIAGNOSIS — Z79891 Long term (current) use of opiate analgesic: Secondary | ICD-10-CM | POA: Diagnosis not present

## 2022-09-05 DIAGNOSIS — K5909 Other constipation: Secondary | ICD-10-CM | POA: Diagnosis not present

## 2022-09-05 DIAGNOSIS — Z7982 Long term (current) use of aspirin: Secondary | ICD-10-CM | POA: Diagnosis not present

## 2022-09-07 ENCOUNTER — Ambulatory Visit (INDEPENDENT_AMBULATORY_CARE_PROVIDER_SITE_OTHER): Payer: Medicare Other | Admitting: Podiatry

## 2022-09-07 DIAGNOSIS — J449 Chronic obstructive pulmonary disease, unspecified: Secondary | ICD-10-CM | POA: Diagnosis not present

## 2022-09-07 DIAGNOSIS — I4510 Unspecified right bundle-branch block: Secondary | ICD-10-CM | POA: Diagnosis not present

## 2022-09-07 DIAGNOSIS — L97412 Non-pressure chronic ulcer of right heel and midfoot with fat layer exposed: Secondary | ICD-10-CM | POA: Diagnosis not present

## 2022-09-07 DIAGNOSIS — E11621 Type 2 diabetes mellitus with foot ulcer: Secondary | ICD-10-CM | POA: Diagnosis not present

## 2022-09-07 DIAGNOSIS — E039 Hypothyroidism, unspecified: Secondary | ICD-10-CM | POA: Diagnosis not present

## 2022-09-07 DIAGNOSIS — E785 Hyperlipidemia, unspecified: Secondary | ICD-10-CM | POA: Diagnosis not present

## 2022-09-07 DIAGNOSIS — M1990 Unspecified osteoarthritis, unspecified site: Secondary | ICD-10-CM | POA: Diagnosis not present

## 2022-09-07 DIAGNOSIS — Z794 Long term (current) use of insulin: Secondary | ICD-10-CM | POA: Diagnosis not present

## 2022-09-07 DIAGNOSIS — E1142 Type 2 diabetes mellitus with diabetic polyneuropathy: Secondary | ICD-10-CM | POA: Diagnosis not present

## 2022-09-07 DIAGNOSIS — G43909 Migraine, unspecified, not intractable, without status migrainosus: Secondary | ICD-10-CM | POA: Diagnosis not present

## 2022-09-07 DIAGNOSIS — M545 Low back pain, unspecified: Secondary | ICD-10-CM | POA: Diagnosis not present

## 2022-09-07 DIAGNOSIS — K5909 Other constipation: Secondary | ICD-10-CM | POA: Diagnosis not present

## 2022-09-07 DIAGNOSIS — G894 Chronic pain syndrome: Secondary | ICD-10-CM | POA: Diagnosis not present

## 2022-09-07 DIAGNOSIS — Z7982 Long term (current) use of aspirin: Secondary | ICD-10-CM | POA: Diagnosis not present

## 2022-09-07 DIAGNOSIS — Z79891 Long term (current) use of opiate analgesic: Secondary | ICD-10-CM | POA: Diagnosis not present

## 2022-09-07 DIAGNOSIS — E559 Vitamin D deficiency, unspecified: Secondary | ICD-10-CM | POA: Diagnosis not present

## 2022-09-07 DIAGNOSIS — E538 Deficiency of other specified B group vitamins: Secondary | ICD-10-CM | POA: Diagnosis not present

## 2022-09-07 DIAGNOSIS — F17210 Nicotine dependence, cigarettes, uncomplicated: Secondary | ICD-10-CM | POA: Diagnosis not present

## 2022-09-07 NOTE — Progress Notes (Signed)
Subjective:  Patient ID: Katherine Herrera, female    DOB: 1966-09-24,  MRN: [id number]  Chief Complaint  Patient presents with   Diabetic Ulcer    DOS: 04/13/2022 Procedure: Right foot revisional I&D washout with Integra application/VAC  56 y.o. female returns for post-op check.  Patient states that she is doing okay.  The VAC is functioning well.  Granular wound bed noted.  The wound size is decreasing the wound VAC seems to be doing really good.  No acute complaints. Review of Systems: Negative except as noted in the HPI. Denies N/V/F/Ch.  Past Medical History:  Diagnosis Date   Anxiety    Arthritis    hands   B12 deficiency    Chronic constipation    Chronic pain syndrome    neck, lower back, and foot   COPD (chronic obstructive pulmonary disease) ([place])    Diabetic ulcer of right foot ([place])    Gout    possible   History of seizure    05-26-2020  per pt as child and last one in late teen's   History of sepsis    due to lower extremity cellulits 2019 left , right 03/ 2021   History of syncope    Hyperlipidemia    Hypothyroidism    Impaired range of motion of cervical spine    per pt hx cervical fusion C4 -- 7,  has to be careful about turning her head to the right can cause her to pass out   Insulin dependent type 2 diabetes mellitus [facility])    endocrinologist--- dr Chalmers Cater    (05-26-2020 per pt check's blood sugar twice dialy and at times a third time,  fasting am blood sugar's---- 200 -- 250)   LBP (low back pain)    DR Patrice Paradise   Migraine    Peripheral neuropathy    RBBB (right bundle branch block)    Tenosynovitis of foot and ankle 09/03/2013   TTS (tarsal tunnel syndrome) 2010   Left Foot [facility] in [place]   Vitamin D deficiency    Wears glasses     Current Outpatient Medications:    B-D INS SYR ULTRAFINE 1CC/31G 31G X 5/16" 1 ML MISC, USE AS DIRECTED, Disp: 100 each, Rfl: 0   baclofen (LIORESAL) 10 MG tablet, TAKE 2 TABLETS BY MOUTH TWICE  DAILY, Disp: 180 tablet, Rfl:  1   bisacodyl (DULCOLAX) 5 MG EC tablet, Take 10 mg by mouth at bedtime., Disp: , Rfl:    Blood Glucose Monitoring Suppl (ONETOUCH VERIO) w/Device KIT, , Disp: , Rfl:    cholecalciferol (VITAMIN D3) 25 MCG (1000 UNIT) tablet, Take 1,000 Units by mouth daily., Disp: , Rfl:    clonazePAM (KLONOPIN) 0.5 MG tablet, Take 1 tablet (0.5 mg total) by mouth 2 (two) times daily as needed for up to 5 days for anxiety., Disp: 60 tablet, Rfl: 1   colchicine 0.6 MG tablet, TAKE 2 TABLETS AS NEEDED GOUT ATTACK. THEN TAKE ANOTHER 1 TAB IN 1-2 HOURS. DONT REPEAT FOR 3 DAYS. (Patient taking differently: Take 0.6 mg by mouth daily as needed (gout).), Disp: 18 tablet, Rfl: 1   docusate sodium (COLACE) 100 MG capsule, Take 100 mg by mouth at bedtime., Disp: , Rfl:    ezetimibe (ZETIA) 10 MG tablet, TAKE 1 TABLET BY MOUTH DAILY, Disp: 100 tablet, Rfl: 2   FLUoxetine (PROZAC) 10 MG capsule, Take 1 capsule (10 mg total) by mouth daily., Disp: 90 capsule, Rfl: 3   gabapentin (NEURONTIN) 800 MG  tablet, TAKE 1 TABLET BY MOUTH 4  TIMES DAILY (Patient taking differently: Take 800 mg by mouth 3 (three) times daily.), Disp: 360 tablet, Rfl: 3   insulin NPH-regular Human (70-30) 100 UNIT/ML injection, Inject 20 Units into the skin 2 (two) times daily with a meal. (Patient taking differently: Inject 30-40 Units into the skin See admin instructions. 30 units in the morning and afternoon and 40 units at night), Disp: 10 mL, Rfl: 11   lamoTRIgine (LAMICTAL) 25 MG tablet, TAKE 1 TABLET BY MOUTH  TWICE DAILY, Disp: 180 tablet, Rfl: 1   Lancet Devices (ACCU-CHEK SOFTCLIX) lancets, Test two times daily, Disp: 100 each, Rfl: 12   Lancets (ONETOUCH DELICA PLUS [id number]) MISC, , Disp: , Rfl:    levothyroxine (SYNTHROID) 75 MCG tablet, TAKE 1 TABLET BY MOUTH  DAILY (Patient taking differently: Take 75 mcg by mouth daily before breakfast.), Disp: 90 tablet, Rfl: 3   lisinopril (ZESTRIL) 10 MG tablet, TAKE 1 TABLET BY MOUTH DAILY, Disp: 100  tablet, Rfl: 2   loratadine (CLARITIN) 10 MG tablet, Take 1 tablet (10 mg total) by mouth daily as needed for allergies., Disp: 100 tablet, Rfl: 3   ondansetron (ZOFRAN) 4 MG tablet, Take 4 mg by mouth every 8 (eight) hours as needed for nausea or vomiting., Disp: , Rfl:    oxyCODONE (ROXICODONE) 15 MG immediate release tablet, Take 1 tablet (15 mg total) by mouth every 6 (six) hours as needed for pain., Disp: 120 tablet, Rfl: 0   oxyCODONE (ROXICODONE) 15 MG immediate release tablet, Take 1 tablet (15 mg total) by mouth every 6 (six) hours as needed for pain., Disp: 120 tablet, Rfl: 0   rOPINIRole (REQUIP) 0.5 MG tablet, TAKE 1 TABLET BY MOUTH AT  BEDTIME (Patient taking differently: Take 0.5 mg by mouth daily.), Disp: 90 tablet, Rfl: 3   silver sulfADIAZINE (SILVADENE) 1 % cream, APPLY PEA-SIZED AMOUNT TO WOUND DAILY., Disp: 50 g, Rfl: 0   vitamin B-12 (CYANOCOBALAMIN) 1000 MCG tablet, Take 1,000 mcg by mouth daily., Disp: , Rfl:   Social History   Tobacco Use  Smoking Status Every Day   Packs/day: 1.00   Years: 10.00   Total pack years: 10.00   Types: Cigarettes  Smokeless Tobacco Never  Tobacco Comments   Trying to quit, smoking 1 ppd    Allergies  Allergen Reactions   Cephalexin Nausea And Vomiting   Demerol  [Meperidine Hcl] Rash   Lovastatin Other (See Comments)    Possible myalgia    Metformin And Related Nausea And Vomiting   Sulfamethoxazole-Trimethoprim     Other reaction(s): Unknown DILI, pancreatitis   Crestor [Rosuvastatin Calcium]     myalgia   Hydromorphone     Other reaction(s): Unknown   Niacin Other (See Comments)    Unknown    Other Other (See Comments)   Hydromorphone Hcl Itching    Patient has been tolerating Hydromorphone tablets without adverse effect (07/09/19) Other reaction(s): Unknown   Paroxetine Other (See Comments)    Unknown    Objective:  There were no vitals filed for this visit. There is no height or weight on file to calculate  BMI. Constitutional Well developed. Well nourished.  Vascular Foot warm and well perfused. Capillary refill normal to all digits.   Neurologic Normal speech. Oriented to person, place, and time. Epicritic sensation to light touch grossly present bilaterally.  Dermatologic Granular wound bed noted to the right foot.  Debridement was carried out minimally.  No purulent drainage  noted no malodor present.  Some maceration.  Orthopedic: Tenderness to palpation noted about the surgical site.   Radiographs: None Assessment:   No diagnosis found.     Plan:  Patient was evaluated and treated and all questions answered.  S/p foot surgery right -Progressing as expected post-operatively. -XR: See above -WB Status: Partial weightbearing to the heel/nonweightbearing in walker -Granular wound bed noted.  Wound size is decreasing. -Medications: None -Reapply the VAC today.  Continue Betadine dressing until then. -Minimal debridement was carried out in standard technique.  No complication noted.  No follow-ups on file.

## 2022-09-08 DIAGNOSIS — E1165 Type 2 diabetes mellitus with hyperglycemia: Secondary | ICD-10-CM | POA: Diagnosis not present

## 2022-09-09 DIAGNOSIS — E538 Deficiency of other specified B group vitamins: Secondary | ICD-10-CM | POA: Diagnosis not present

## 2022-09-09 DIAGNOSIS — Z79891 Long term (current) use of opiate analgesic: Secondary | ICD-10-CM | POA: Diagnosis not present

## 2022-09-09 DIAGNOSIS — E559 Vitamin D deficiency, unspecified: Secondary | ICD-10-CM | POA: Diagnosis not present

## 2022-09-09 DIAGNOSIS — G43909 Migraine, unspecified, not intractable, without status migrainosus: Secondary | ICD-10-CM | POA: Diagnosis not present

## 2022-09-09 DIAGNOSIS — F17210 Nicotine dependence, cigarettes, uncomplicated: Secondary | ICD-10-CM | POA: Diagnosis not present

## 2022-09-09 DIAGNOSIS — M1990 Unspecified osteoarthritis, unspecified site: Secondary | ICD-10-CM | POA: Diagnosis not present

## 2022-09-09 DIAGNOSIS — G894 Chronic pain syndrome: Secondary | ICD-10-CM | POA: Diagnosis not present

## 2022-09-09 DIAGNOSIS — M545 Low back pain, unspecified: Secondary | ICD-10-CM | POA: Diagnosis not present

## 2022-09-09 DIAGNOSIS — I4510 Unspecified right bundle-branch block: Secondary | ICD-10-CM | POA: Diagnosis not present

## 2022-09-09 DIAGNOSIS — L97412 Non-pressure chronic ulcer of right heel and midfoot with fat layer exposed: Secondary | ICD-10-CM | POA: Diagnosis not present

## 2022-09-09 DIAGNOSIS — J449 Chronic obstructive pulmonary disease, unspecified: Secondary | ICD-10-CM | POA: Diagnosis not present

## 2022-09-09 DIAGNOSIS — Z7982 Long term (current) use of aspirin: Secondary | ICD-10-CM | POA: Diagnosis not present

## 2022-09-09 DIAGNOSIS — E11621 Type 2 diabetes mellitus with foot ulcer: Secondary | ICD-10-CM | POA: Diagnosis not present

## 2022-09-09 DIAGNOSIS — E1142 Type 2 diabetes mellitus with diabetic polyneuropathy: Secondary | ICD-10-CM | POA: Diagnosis not present

## 2022-09-09 DIAGNOSIS — K5909 Other constipation: Secondary | ICD-10-CM | POA: Diagnosis not present

## 2022-09-09 DIAGNOSIS — E039 Hypothyroidism, unspecified: Secondary | ICD-10-CM | POA: Diagnosis not present

## 2022-09-09 DIAGNOSIS — Z794 Long term (current) use of insulin: Secondary | ICD-10-CM | POA: Diagnosis not present

## 2022-09-09 DIAGNOSIS — E785 Hyperlipidemia, unspecified: Secondary | ICD-10-CM | POA: Diagnosis not present

## 2022-09-12 DIAGNOSIS — Z7982 Long term (current) use of aspirin: Secondary | ICD-10-CM | POA: Diagnosis not present

## 2022-09-12 DIAGNOSIS — L97412 Non-pressure chronic ulcer of right heel and midfoot with fat layer exposed: Secondary | ICD-10-CM | POA: Diagnosis not present

## 2022-09-12 DIAGNOSIS — E11621 Type 2 diabetes mellitus with foot ulcer: Secondary | ICD-10-CM | POA: Diagnosis not present

## 2022-09-12 DIAGNOSIS — Z794 Long term (current) use of insulin: Secondary | ICD-10-CM | POA: Diagnosis not present

## 2022-09-12 DIAGNOSIS — F17210 Nicotine dependence, cigarettes, uncomplicated: Secondary | ICD-10-CM | POA: Diagnosis not present

## 2022-09-12 DIAGNOSIS — E538 Deficiency of other specified B group vitamins: Secondary | ICD-10-CM | POA: Diagnosis not present

## 2022-09-12 DIAGNOSIS — E1142 Type 2 diabetes mellitus with diabetic polyneuropathy: Secondary | ICD-10-CM | POA: Diagnosis not present

## 2022-09-12 DIAGNOSIS — E785 Hyperlipidemia, unspecified: Secondary | ICD-10-CM | POA: Diagnosis not present

## 2022-09-12 DIAGNOSIS — G894 Chronic pain syndrome: Secondary | ICD-10-CM | POA: Diagnosis not present

## 2022-09-12 DIAGNOSIS — G43909 Migraine, unspecified, not intractable, without status migrainosus: Secondary | ICD-10-CM | POA: Diagnosis not present

## 2022-09-12 DIAGNOSIS — E559 Vitamin D deficiency, unspecified: Secondary | ICD-10-CM | POA: Diagnosis not present

## 2022-09-12 DIAGNOSIS — M545 Low back pain, unspecified: Secondary | ICD-10-CM | POA: Diagnosis not present

## 2022-09-12 DIAGNOSIS — I4510 Unspecified right bundle-branch block: Secondary | ICD-10-CM | POA: Diagnosis not present

## 2022-09-12 DIAGNOSIS — M1990 Unspecified osteoarthritis, unspecified site: Secondary | ICD-10-CM | POA: Diagnosis not present

## 2022-09-12 DIAGNOSIS — Z79891 Long term (current) use of opiate analgesic: Secondary | ICD-10-CM | POA: Diagnosis not present

## 2022-09-12 DIAGNOSIS — E039 Hypothyroidism, unspecified: Secondary | ICD-10-CM | POA: Diagnosis not present

## 2022-09-12 DIAGNOSIS — J449 Chronic obstructive pulmonary disease, unspecified: Secondary | ICD-10-CM | POA: Diagnosis not present

## 2022-09-12 DIAGNOSIS — K5909 Other constipation: Secondary | ICD-10-CM | POA: Diagnosis not present

## 2022-09-14 DIAGNOSIS — E11621 Type 2 diabetes mellitus with foot ulcer: Secondary | ICD-10-CM | POA: Diagnosis not present

## 2022-09-14 DIAGNOSIS — E785 Hyperlipidemia, unspecified: Secondary | ICD-10-CM | POA: Diagnosis not present

## 2022-09-14 DIAGNOSIS — J449 Chronic obstructive pulmonary disease, unspecified: Secondary | ICD-10-CM | POA: Diagnosis not present

## 2022-09-14 DIAGNOSIS — E559 Vitamin D deficiency, unspecified: Secondary | ICD-10-CM | POA: Diagnosis not present

## 2022-09-14 DIAGNOSIS — G894 Chronic pain syndrome: Secondary | ICD-10-CM | POA: Diagnosis not present

## 2022-09-14 DIAGNOSIS — F17210 Nicotine dependence, cigarettes, uncomplicated: Secondary | ICD-10-CM | POA: Diagnosis not present

## 2022-09-14 DIAGNOSIS — M545 Low back pain, unspecified: Secondary | ICD-10-CM | POA: Diagnosis not present

## 2022-09-14 DIAGNOSIS — Z794 Long term (current) use of insulin: Secondary | ICD-10-CM | POA: Diagnosis not present

## 2022-09-14 DIAGNOSIS — G43909 Migraine, unspecified, not intractable, without status migrainosus: Secondary | ICD-10-CM | POA: Diagnosis not present

## 2022-09-14 DIAGNOSIS — E538 Deficiency of other specified B group vitamins: Secondary | ICD-10-CM | POA: Diagnosis not present

## 2022-09-14 DIAGNOSIS — K5909 Other constipation: Secondary | ICD-10-CM | POA: Diagnosis not present

## 2022-09-14 DIAGNOSIS — E039 Hypothyroidism, unspecified: Secondary | ICD-10-CM | POA: Diagnosis not present

## 2022-09-14 DIAGNOSIS — L97412 Non-pressure chronic ulcer of right heel and midfoot with fat layer exposed: Secondary | ICD-10-CM | POA: Diagnosis not present

## 2022-09-14 DIAGNOSIS — M1990 Unspecified osteoarthritis, unspecified site: Secondary | ICD-10-CM | POA: Diagnosis not present

## 2022-09-14 DIAGNOSIS — E1142 Type 2 diabetes mellitus with diabetic polyneuropathy: Secondary | ICD-10-CM | POA: Diagnosis not present

## 2022-09-14 DIAGNOSIS — Z79891 Long term (current) use of opiate analgesic: Secondary | ICD-10-CM | POA: Diagnosis not present

## 2022-09-14 DIAGNOSIS — I4510 Unspecified right bundle-branch block: Secondary | ICD-10-CM | POA: Diagnosis not present

## 2022-09-14 DIAGNOSIS — Z7982 Long term (current) use of aspirin: Secondary | ICD-10-CM | POA: Diagnosis not present

## 2022-09-16 DIAGNOSIS — G894 Chronic pain syndrome: Secondary | ICD-10-CM | POA: Diagnosis not present

## 2022-09-16 DIAGNOSIS — M1990 Unspecified osteoarthritis, unspecified site: Secondary | ICD-10-CM | POA: Diagnosis not present

## 2022-09-16 DIAGNOSIS — J449 Chronic obstructive pulmonary disease, unspecified: Secondary | ICD-10-CM | POA: Diagnosis not present

## 2022-09-16 DIAGNOSIS — E559 Vitamin D deficiency, unspecified: Secondary | ICD-10-CM | POA: Diagnosis not present

## 2022-09-16 DIAGNOSIS — E039 Hypothyroidism, unspecified: Secondary | ICD-10-CM | POA: Diagnosis not present

## 2022-09-16 DIAGNOSIS — F17210 Nicotine dependence, cigarettes, uncomplicated: Secondary | ICD-10-CM | POA: Diagnosis not present

## 2022-09-16 DIAGNOSIS — M545 Low back pain, unspecified: Secondary | ICD-10-CM | POA: Diagnosis not present

## 2022-09-16 DIAGNOSIS — Z7982 Long term (current) use of aspirin: Secondary | ICD-10-CM | POA: Diagnosis not present

## 2022-09-16 DIAGNOSIS — K5909 Other constipation: Secondary | ICD-10-CM | POA: Diagnosis not present

## 2022-09-16 DIAGNOSIS — E538 Deficiency of other specified B group vitamins: Secondary | ICD-10-CM | POA: Diagnosis not present

## 2022-09-16 DIAGNOSIS — I4510 Unspecified right bundle-branch block: Secondary | ICD-10-CM | POA: Diagnosis not present

## 2022-09-16 DIAGNOSIS — E785 Hyperlipidemia, unspecified: Secondary | ICD-10-CM | POA: Diagnosis not present

## 2022-09-16 DIAGNOSIS — Z794 Long term (current) use of insulin: Secondary | ICD-10-CM | POA: Diagnosis not present

## 2022-09-16 DIAGNOSIS — L97412 Non-pressure chronic ulcer of right heel and midfoot with fat layer exposed: Secondary | ICD-10-CM | POA: Diagnosis not present

## 2022-09-16 DIAGNOSIS — E11621 Type 2 diabetes mellitus with foot ulcer: Secondary | ICD-10-CM | POA: Diagnosis not present

## 2022-09-16 DIAGNOSIS — Z79891 Long term (current) use of opiate analgesic: Secondary | ICD-10-CM | POA: Diagnosis not present

## 2022-09-16 DIAGNOSIS — G43909 Migraine, unspecified, not intractable, without status migrainosus: Secondary | ICD-10-CM | POA: Diagnosis not present

## 2022-09-16 DIAGNOSIS — E1142 Type 2 diabetes mellitus with diabetic polyneuropathy: Secondary | ICD-10-CM | POA: Diagnosis not present

## 2022-09-19 DIAGNOSIS — G43909 Migraine, unspecified, not intractable, without status migrainosus: Secondary | ICD-10-CM | POA: Diagnosis not present

## 2022-09-19 DIAGNOSIS — J449 Chronic obstructive pulmonary disease, unspecified: Secondary | ICD-10-CM | POA: Diagnosis not present

## 2022-09-19 DIAGNOSIS — Z794 Long term (current) use of insulin: Secondary | ICD-10-CM | POA: Diagnosis not present

## 2022-09-19 DIAGNOSIS — K5909 Other constipation: Secondary | ICD-10-CM | POA: Diagnosis not present

## 2022-09-19 DIAGNOSIS — I4510 Unspecified right bundle-branch block: Secondary | ICD-10-CM | POA: Diagnosis not present

## 2022-09-19 DIAGNOSIS — E1142 Type 2 diabetes mellitus with diabetic polyneuropathy: Secondary | ICD-10-CM | POA: Diagnosis not present

## 2022-09-19 DIAGNOSIS — E538 Deficiency of other specified B group vitamins: Secondary | ICD-10-CM | POA: Diagnosis not present

## 2022-09-19 DIAGNOSIS — E039 Hypothyroidism, unspecified: Secondary | ICD-10-CM | POA: Diagnosis not present

## 2022-09-19 DIAGNOSIS — F17210 Nicotine dependence, cigarettes, uncomplicated: Secondary | ICD-10-CM | POA: Diagnosis not present

## 2022-09-19 DIAGNOSIS — E11621 Type 2 diabetes mellitus with foot ulcer: Secondary | ICD-10-CM | POA: Diagnosis not present

## 2022-09-19 DIAGNOSIS — E559 Vitamin D deficiency, unspecified: Secondary | ICD-10-CM | POA: Diagnosis not present

## 2022-09-19 DIAGNOSIS — L97412 Non-pressure chronic ulcer of right heel and midfoot with fat layer exposed: Secondary | ICD-10-CM | POA: Diagnosis not present

## 2022-09-19 DIAGNOSIS — E785 Hyperlipidemia, unspecified: Secondary | ICD-10-CM | POA: Diagnosis not present

## 2022-09-19 DIAGNOSIS — Z79891 Long term (current) use of opiate analgesic: Secondary | ICD-10-CM | POA: Diagnosis not present

## 2022-09-19 DIAGNOSIS — Z7982 Long term (current) use of aspirin: Secondary | ICD-10-CM | POA: Diagnosis not present

## 2022-09-19 DIAGNOSIS — G894 Chronic pain syndrome: Secondary | ICD-10-CM | POA: Diagnosis not present

## 2022-09-19 DIAGNOSIS — M1990 Unspecified osteoarthritis, unspecified site: Secondary | ICD-10-CM | POA: Diagnosis not present

## 2022-09-19 DIAGNOSIS — M545 Low back pain, unspecified: Secondary | ICD-10-CM | POA: Diagnosis not present

## 2022-09-21 DIAGNOSIS — E1142 Type 2 diabetes mellitus with diabetic polyneuropathy: Secondary | ICD-10-CM | POA: Diagnosis not present

## 2022-09-21 DIAGNOSIS — E559 Vitamin D deficiency, unspecified: Secondary | ICD-10-CM | POA: Diagnosis not present

## 2022-09-21 DIAGNOSIS — E039 Hypothyroidism, unspecified: Secondary | ICD-10-CM | POA: Diagnosis not present

## 2022-09-21 DIAGNOSIS — G43909 Migraine, unspecified, not intractable, without status migrainosus: Secondary | ICD-10-CM | POA: Diagnosis not present

## 2022-09-21 DIAGNOSIS — Z794 Long term (current) use of insulin: Secondary | ICD-10-CM | POA: Diagnosis not present

## 2022-09-21 DIAGNOSIS — Z79891 Long term (current) use of opiate analgesic: Secondary | ICD-10-CM | POA: Diagnosis not present

## 2022-09-21 DIAGNOSIS — F17210 Nicotine dependence, cigarettes, uncomplicated: Secondary | ICD-10-CM | POA: Diagnosis not present

## 2022-09-21 DIAGNOSIS — K5909 Other constipation: Secondary | ICD-10-CM | POA: Diagnosis not present

## 2022-09-21 DIAGNOSIS — L97412 Non-pressure chronic ulcer of right heel and midfoot with fat layer exposed: Secondary | ICD-10-CM | POA: Diagnosis not present

## 2022-09-21 DIAGNOSIS — E11621 Type 2 diabetes mellitus with foot ulcer: Secondary | ICD-10-CM | POA: Diagnosis not present

## 2022-09-21 DIAGNOSIS — M1990 Unspecified osteoarthritis, unspecified site: Secondary | ICD-10-CM | POA: Diagnosis not present

## 2022-09-21 DIAGNOSIS — M545 Low back pain, unspecified: Secondary | ICD-10-CM | POA: Diagnosis not present

## 2022-09-21 DIAGNOSIS — I4510 Unspecified right bundle-branch block: Secondary | ICD-10-CM | POA: Diagnosis not present

## 2022-09-21 DIAGNOSIS — Z7982 Long term (current) use of aspirin: Secondary | ICD-10-CM | POA: Diagnosis not present

## 2022-09-21 DIAGNOSIS — E538 Deficiency of other specified B group vitamins: Secondary | ICD-10-CM | POA: Diagnosis not present

## 2022-09-21 DIAGNOSIS — G894 Chronic pain syndrome: Secondary | ICD-10-CM | POA: Diagnosis not present

## 2022-09-21 DIAGNOSIS — J449 Chronic obstructive pulmonary disease, unspecified: Secondary | ICD-10-CM | POA: Diagnosis not present

## 2022-09-21 DIAGNOSIS — E785 Hyperlipidemia, unspecified: Secondary | ICD-10-CM | POA: Diagnosis not present

## 2022-09-23 DIAGNOSIS — E039 Hypothyroidism, unspecified: Secondary | ICD-10-CM | POA: Diagnosis not present

## 2022-09-23 DIAGNOSIS — I4510 Unspecified right bundle-branch block: Secondary | ICD-10-CM | POA: Diagnosis not present

## 2022-09-23 DIAGNOSIS — Z7982 Long term (current) use of aspirin: Secondary | ICD-10-CM | POA: Diagnosis not present

## 2022-09-23 DIAGNOSIS — M1990 Unspecified osteoarthritis, unspecified site: Secondary | ICD-10-CM | POA: Diagnosis not present

## 2022-09-23 DIAGNOSIS — L97412 Non-pressure chronic ulcer of right heel and midfoot with fat layer exposed: Secondary | ICD-10-CM | POA: Diagnosis not present

## 2022-09-23 DIAGNOSIS — J449 Chronic obstructive pulmonary disease, unspecified: Secondary | ICD-10-CM | POA: Diagnosis not present

## 2022-09-23 DIAGNOSIS — E785 Hyperlipidemia, unspecified: Secondary | ICD-10-CM | POA: Diagnosis not present

## 2022-09-23 DIAGNOSIS — F17210 Nicotine dependence, cigarettes, uncomplicated: Secondary | ICD-10-CM | POA: Diagnosis not present

## 2022-09-23 DIAGNOSIS — K5909 Other constipation: Secondary | ICD-10-CM | POA: Diagnosis not present

## 2022-09-23 DIAGNOSIS — E11621 Type 2 diabetes mellitus with foot ulcer: Secondary | ICD-10-CM | POA: Diagnosis not present

## 2022-09-23 DIAGNOSIS — G43909 Migraine, unspecified, not intractable, without status migrainosus: Secondary | ICD-10-CM | POA: Diagnosis not present

## 2022-09-23 DIAGNOSIS — G894 Chronic pain syndrome: Secondary | ICD-10-CM | POA: Diagnosis not present

## 2022-09-23 DIAGNOSIS — E559 Vitamin D deficiency, unspecified: Secondary | ICD-10-CM | POA: Diagnosis not present

## 2022-09-23 DIAGNOSIS — Z794 Long term (current) use of insulin: Secondary | ICD-10-CM | POA: Diagnosis not present

## 2022-09-23 DIAGNOSIS — E538 Deficiency of other specified B group vitamins: Secondary | ICD-10-CM | POA: Diagnosis not present

## 2022-09-23 DIAGNOSIS — M545 Low back pain, unspecified: Secondary | ICD-10-CM | POA: Diagnosis not present

## 2022-09-23 DIAGNOSIS — E1142 Type 2 diabetes mellitus with diabetic polyneuropathy: Secondary | ICD-10-CM | POA: Diagnosis not present

## 2022-09-23 DIAGNOSIS — Z79891 Long term (current) use of opiate analgesic: Secondary | ICD-10-CM | POA: Diagnosis not present

## 2022-09-24 ENCOUNTER — Other Ambulatory Visit: Payer: Self-pay | Admitting: Internal Medicine

## 2022-09-26 DIAGNOSIS — Z794 Long term (current) use of insulin: Secondary | ICD-10-CM | POA: Diagnosis not present

## 2022-09-26 DIAGNOSIS — E039 Hypothyroidism, unspecified: Secondary | ICD-10-CM | POA: Diagnosis not present

## 2022-09-26 DIAGNOSIS — I4510 Unspecified right bundle-branch block: Secondary | ICD-10-CM | POA: Diagnosis not present

## 2022-09-26 DIAGNOSIS — M545 Low back pain, unspecified: Secondary | ICD-10-CM | POA: Diagnosis not present

## 2022-09-26 DIAGNOSIS — J449 Chronic obstructive pulmonary disease, unspecified: Secondary | ICD-10-CM | POA: Diagnosis not present

## 2022-09-26 DIAGNOSIS — E1142 Type 2 diabetes mellitus with diabetic polyneuropathy: Secondary | ICD-10-CM | POA: Diagnosis not present

## 2022-09-26 DIAGNOSIS — Z7982 Long term (current) use of aspirin: Secondary | ICD-10-CM | POA: Diagnosis not present

## 2022-09-26 DIAGNOSIS — G43909 Migraine, unspecified, not intractable, without status migrainosus: Secondary | ICD-10-CM | POA: Diagnosis not present

## 2022-09-26 DIAGNOSIS — E785 Hyperlipidemia, unspecified: Secondary | ICD-10-CM | POA: Diagnosis not present

## 2022-09-26 DIAGNOSIS — E11621 Type 2 diabetes mellitus with foot ulcer: Secondary | ICD-10-CM | POA: Diagnosis not present

## 2022-09-26 DIAGNOSIS — Z79891 Long term (current) use of opiate analgesic: Secondary | ICD-10-CM | POA: Diagnosis not present

## 2022-09-26 DIAGNOSIS — K5909 Other constipation: Secondary | ICD-10-CM | POA: Diagnosis not present

## 2022-09-26 DIAGNOSIS — L97412 Non-pressure chronic ulcer of right heel and midfoot with fat layer exposed: Secondary | ICD-10-CM | POA: Diagnosis not present

## 2022-09-26 DIAGNOSIS — E559 Vitamin D deficiency, unspecified: Secondary | ICD-10-CM | POA: Diagnosis not present

## 2022-09-26 DIAGNOSIS — M1990 Unspecified osteoarthritis, unspecified site: Secondary | ICD-10-CM | POA: Diagnosis not present

## 2022-09-26 DIAGNOSIS — E538 Deficiency of other specified B group vitamins: Secondary | ICD-10-CM | POA: Diagnosis not present

## 2022-09-26 DIAGNOSIS — G894 Chronic pain syndrome: Secondary | ICD-10-CM | POA: Diagnosis not present

## 2022-09-26 DIAGNOSIS — F17210 Nicotine dependence, cigarettes, uncomplicated: Secondary | ICD-10-CM | POA: Diagnosis not present

## 2022-09-27 ENCOUNTER — Other Ambulatory Visit: Payer: Self-pay | Admitting: Internal Medicine

## 2022-09-28 DIAGNOSIS — M1990 Unspecified osteoarthritis, unspecified site: Secondary | ICD-10-CM | POA: Diagnosis not present

## 2022-09-28 DIAGNOSIS — F17210 Nicotine dependence, cigarettes, uncomplicated: Secondary | ICD-10-CM | POA: Diagnosis not present

## 2022-09-28 DIAGNOSIS — G43909 Migraine, unspecified, not intractable, without status migrainosus: Secondary | ICD-10-CM | POA: Diagnosis not present

## 2022-09-28 DIAGNOSIS — I4510 Unspecified right bundle-branch block: Secondary | ICD-10-CM | POA: Diagnosis not present

## 2022-09-28 DIAGNOSIS — Z79891 Long term (current) use of opiate analgesic: Secondary | ICD-10-CM | POA: Diagnosis not present

## 2022-09-28 DIAGNOSIS — E039 Hypothyroidism, unspecified: Secondary | ICD-10-CM | POA: Diagnosis not present

## 2022-09-28 DIAGNOSIS — Z794 Long term (current) use of insulin: Secondary | ICD-10-CM | POA: Diagnosis not present

## 2022-09-28 DIAGNOSIS — E11621 Type 2 diabetes mellitus with foot ulcer: Secondary | ICD-10-CM | POA: Diagnosis not present

## 2022-09-28 DIAGNOSIS — L97412 Non-pressure chronic ulcer of right heel and midfoot with fat layer exposed: Secondary | ICD-10-CM | POA: Diagnosis not present

## 2022-09-28 DIAGNOSIS — Z7982 Long term (current) use of aspirin: Secondary | ICD-10-CM | POA: Diagnosis not present

## 2022-09-28 DIAGNOSIS — J449 Chronic obstructive pulmonary disease, unspecified: Secondary | ICD-10-CM | POA: Diagnosis not present

## 2022-09-28 DIAGNOSIS — M545 Low back pain, unspecified: Secondary | ICD-10-CM | POA: Diagnosis not present

## 2022-09-28 DIAGNOSIS — E538 Deficiency of other specified B group vitamins: Secondary | ICD-10-CM | POA: Diagnosis not present

## 2022-09-28 DIAGNOSIS — K5909 Other constipation: Secondary | ICD-10-CM | POA: Diagnosis not present

## 2022-09-28 DIAGNOSIS — G894 Chronic pain syndrome: Secondary | ICD-10-CM | POA: Diagnosis not present

## 2022-09-28 DIAGNOSIS — M86671 Other chronic osteomyelitis, right ankle and foot: Secondary | ICD-10-CM | POA: Diagnosis not present

## 2022-09-28 DIAGNOSIS — E559 Vitamin D deficiency, unspecified: Secondary | ICD-10-CM | POA: Diagnosis not present

## 2022-09-28 DIAGNOSIS — E785 Hyperlipidemia, unspecified: Secondary | ICD-10-CM | POA: Diagnosis not present

## 2022-09-28 DIAGNOSIS — E1142 Type 2 diabetes mellitus with diabetic polyneuropathy: Secondary | ICD-10-CM | POA: Diagnosis not present

## 2022-09-29 DIAGNOSIS — E08621 Diabetes mellitus due to underlying condition with foot ulcer: Secondary | ICD-10-CM | POA: Diagnosis not present

## 2022-09-29 DIAGNOSIS — L02611 Cutaneous abscess of right foot: Secondary | ICD-10-CM | POA: Diagnosis not present

## 2022-09-29 DIAGNOSIS — M86671 Other chronic osteomyelitis, right ankle and foot: Secondary | ICD-10-CM | POA: Diagnosis not present

## 2022-09-30 DIAGNOSIS — E039 Hypothyroidism, unspecified: Secondary | ICD-10-CM | POA: Diagnosis not present

## 2022-09-30 DIAGNOSIS — M545 Low back pain, unspecified: Secondary | ICD-10-CM | POA: Diagnosis not present

## 2022-09-30 DIAGNOSIS — E1142 Type 2 diabetes mellitus with diabetic polyneuropathy: Secondary | ICD-10-CM | POA: Diagnosis not present

## 2022-09-30 DIAGNOSIS — K5909 Other constipation: Secondary | ICD-10-CM | POA: Diagnosis not present

## 2022-09-30 DIAGNOSIS — Z79891 Long term (current) use of opiate analgesic: Secondary | ICD-10-CM | POA: Diagnosis not present

## 2022-09-30 DIAGNOSIS — M1990 Unspecified osteoarthritis, unspecified site: Secondary | ICD-10-CM | POA: Diagnosis not present

## 2022-09-30 DIAGNOSIS — E785 Hyperlipidemia, unspecified: Secondary | ICD-10-CM | POA: Diagnosis not present

## 2022-09-30 DIAGNOSIS — F17210 Nicotine dependence, cigarettes, uncomplicated: Secondary | ICD-10-CM | POA: Diagnosis not present

## 2022-09-30 DIAGNOSIS — E559 Vitamin D deficiency, unspecified: Secondary | ICD-10-CM | POA: Diagnosis not present

## 2022-09-30 DIAGNOSIS — E538 Deficiency of other specified B group vitamins: Secondary | ICD-10-CM | POA: Diagnosis not present

## 2022-09-30 DIAGNOSIS — I4510 Unspecified right bundle-branch block: Secondary | ICD-10-CM | POA: Diagnosis not present

## 2022-09-30 DIAGNOSIS — L97412 Non-pressure chronic ulcer of right heel and midfoot with fat layer exposed: Secondary | ICD-10-CM | POA: Diagnosis not present

## 2022-09-30 DIAGNOSIS — Z794 Long term (current) use of insulin: Secondary | ICD-10-CM | POA: Diagnosis not present

## 2022-09-30 DIAGNOSIS — E11621 Type 2 diabetes mellitus with foot ulcer: Secondary | ICD-10-CM | POA: Diagnosis not present

## 2022-09-30 DIAGNOSIS — J449 Chronic obstructive pulmonary disease, unspecified: Secondary | ICD-10-CM | POA: Diagnosis not present

## 2022-09-30 DIAGNOSIS — Z7982 Long term (current) use of aspirin: Secondary | ICD-10-CM | POA: Diagnosis not present

## 2022-09-30 DIAGNOSIS — G894 Chronic pain syndrome: Secondary | ICD-10-CM | POA: Diagnosis not present

## 2022-09-30 DIAGNOSIS — G43909 Migraine, unspecified, not intractable, without status migrainosus: Secondary | ICD-10-CM | POA: Diagnosis not present

## 2022-10-03 DIAGNOSIS — E1142 Type 2 diabetes mellitus with diabetic polyneuropathy: Secondary | ICD-10-CM | POA: Diagnosis not present

## 2022-10-03 DIAGNOSIS — Z7982 Long term (current) use of aspirin: Secondary | ICD-10-CM | POA: Diagnosis not present

## 2022-10-03 DIAGNOSIS — I4510 Unspecified right bundle-branch block: Secondary | ICD-10-CM | POA: Diagnosis not present

## 2022-10-03 DIAGNOSIS — L97412 Non-pressure chronic ulcer of right heel and midfoot with fat layer exposed: Secondary | ICD-10-CM | POA: Diagnosis not present

## 2022-10-03 DIAGNOSIS — Z794 Long term (current) use of insulin: Secondary | ICD-10-CM | POA: Diagnosis not present

## 2022-10-03 DIAGNOSIS — E559 Vitamin D deficiency, unspecified: Secondary | ICD-10-CM | POA: Diagnosis not present

## 2022-10-03 DIAGNOSIS — E11621 Type 2 diabetes mellitus with foot ulcer: Secondary | ICD-10-CM | POA: Diagnosis not present

## 2022-10-03 DIAGNOSIS — G43909 Migraine, unspecified, not intractable, without status migrainosus: Secondary | ICD-10-CM | POA: Diagnosis not present

## 2022-10-03 DIAGNOSIS — G894 Chronic pain syndrome: Secondary | ICD-10-CM | POA: Diagnosis not present

## 2022-10-03 DIAGNOSIS — J449 Chronic obstructive pulmonary disease, unspecified: Secondary | ICD-10-CM | POA: Diagnosis not present

## 2022-10-03 DIAGNOSIS — M545 Low back pain, unspecified: Secondary | ICD-10-CM | POA: Diagnosis not present

## 2022-10-03 DIAGNOSIS — E785 Hyperlipidemia, unspecified: Secondary | ICD-10-CM | POA: Diagnosis not present

## 2022-10-03 DIAGNOSIS — E538 Deficiency of other specified B group vitamins: Secondary | ICD-10-CM | POA: Diagnosis not present

## 2022-10-03 DIAGNOSIS — F17210 Nicotine dependence, cigarettes, uncomplicated: Secondary | ICD-10-CM | POA: Diagnosis not present

## 2022-10-03 DIAGNOSIS — M1990 Unspecified osteoarthritis, unspecified site: Secondary | ICD-10-CM | POA: Diagnosis not present

## 2022-10-03 DIAGNOSIS — E039 Hypothyroidism, unspecified: Secondary | ICD-10-CM | POA: Diagnosis not present

## 2022-10-03 DIAGNOSIS — K5909 Other constipation: Secondary | ICD-10-CM | POA: Diagnosis not present

## 2022-10-03 DIAGNOSIS — Z79891 Long term (current) use of opiate analgesic: Secondary | ICD-10-CM | POA: Diagnosis not present

## 2022-10-05 ENCOUNTER — Telehealth: Payer: Self-pay | Admitting: *Deleted

## 2022-10-05 DIAGNOSIS — E11621 Type 2 diabetes mellitus with foot ulcer: Secondary | ICD-10-CM

## 2022-10-05 DIAGNOSIS — E1142 Type 2 diabetes mellitus with diabetic polyneuropathy: Secondary | ICD-10-CM | POA: Diagnosis not present

## 2022-10-05 DIAGNOSIS — G894 Chronic pain syndrome: Secondary | ICD-10-CM | POA: Diagnosis not present

## 2022-10-05 DIAGNOSIS — M1990 Unspecified osteoarthritis, unspecified site: Secondary | ICD-10-CM | POA: Diagnosis not present

## 2022-10-05 DIAGNOSIS — I4510 Unspecified right bundle-branch block: Secondary | ICD-10-CM | POA: Diagnosis not present

## 2022-10-05 DIAGNOSIS — K5909 Other constipation: Secondary | ICD-10-CM | POA: Diagnosis not present

## 2022-10-05 DIAGNOSIS — E039 Hypothyroidism, unspecified: Secondary | ICD-10-CM | POA: Diagnosis not present

## 2022-10-05 DIAGNOSIS — E538 Deficiency of other specified B group vitamins: Secondary | ICD-10-CM | POA: Diagnosis not present

## 2022-10-05 DIAGNOSIS — Z79891 Long term (current) use of opiate analgesic: Secondary | ICD-10-CM | POA: Diagnosis not present

## 2022-10-05 DIAGNOSIS — E785 Hyperlipidemia, unspecified: Secondary | ICD-10-CM | POA: Diagnosis not present

## 2022-10-05 DIAGNOSIS — M545 Low back pain, unspecified: Secondary | ICD-10-CM | POA: Diagnosis not present

## 2022-10-05 DIAGNOSIS — J449 Chronic obstructive pulmonary disease, unspecified: Secondary | ICD-10-CM | POA: Diagnosis not present

## 2022-10-05 DIAGNOSIS — Z7982 Long term (current) use of aspirin: Secondary | ICD-10-CM | POA: Diagnosis not present

## 2022-10-05 DIAGNOSIS — E559 Vitamin D deficiency, unspecified: Secondary | ICD-10-CM | POA: Diagnosis not present

## 2022-10-05 DIAGNOSIS — G43909 Migraine, unspecified, not intractable, without status migrainosus: Secondary | ICD-10-CM | POA: Diagnosis not present

## 2022-10-05 DIAGNOSIS — L97412 Non-pressure chronic ulcer of right heel and midfoot with fat layer exposed: Secondary | ICD-10-CM | POA: Diagnosis not present

## 2022-10-05 DIAGNOSIS — Z794 Long term (current) use of insulin: Secondary | ICD-10-CM | POA: Diagnosis not present

## 2022-10-05 DIAGNOSIS — F17210 Nicotine dependence, cigarettes, uncomplicated: Secondary | ICD-10-CM | POA: Diagnosis not present

## 2022-10-05 NOTE — Telephone Encounter (Signed)
Patient is calling to requesting that her wound vac be discontinued on Friday 10/07/22,resuming it back on 10/17/22. She is taking a little trip

## 2022-10-05 NOTE — Progress Notes (Signed)
[facility] Quality Team Note  Name: Katherine Herrera Date of Birth: 08/22/1966 MRN: [id number] Date: 10/05/2022  [facility] Quality Team has reviewed this patient's chart, please see recommendations below:  [facility] Quality Other; (KED GAP: PATIENT NEEDS URINE MICROALBUMIN/CREATININE RATIO TEST COMPLETED BEFORE END OF YEAR FOR GAP CLOSURE. PATIENT HAS APPT 10/10/2022)

## 2022-10-06 NOTE — Telephone Encounter (Signed)
Katherine Herrera with [facility] , no answer, left voice message to give verbal order per physician to discontinue the wound vac on 10/07/22, resume on 10/17/22. Faxed order to [facility] to discontinue wound vac until 10/17/22 per patient's request.Confirmation received. Patient has been updated thru voice message.

## 2022-10-07 ENCOUNTER — Ambulatory Visit: Payer: Medicare Other | Admitting: Podiatry

## 2022-10-07 DIAGNOSIS — E11621 Type 2 diabetes mellitus with foot ulcer: Secondary | ICD-10-CM | POA: Diagnosis not present

## 2022-10-07 DIAGNOSIS — G43909 Migraine, unspecified, not intractable, without status migrainosus: Secondary | ICD-10-CM | POA: Diagnosis not present

## 2022-10-07 DIAGNOSIS — Z79891 Long term (current) use of opiate analgesic: Secondary | ICD-10-CM | POA: Diagnosis not present

## 2022-10-07 DIAGNOSIS — E039 Hypothyroidism, unspecified: Secondary | ICD-10-CM | POA: Diagnosis not present

## 2022-10-07 DIAGNOSIS — Z794 Long term (current) use of insulin: Secondary | ICD-10-CM | POA: Diagnosis not present

## 2022-10-07 DIAGNOSIS — K5909 Other constipation: Secondary | ICD-10-CM | POA: Diagnosis not present

## 2022-10-07 DIAGNOSIS — E559 Vitamin D deficiency, unspecified: Secondary | ICD-10-CM | POA: Diagnosis not present

## 2022-10-07 DIAGNOSIS — M1990 Unspecified osteoarthritis, unspecified site: Secondary | ICD-10-CM | POA: Diagnosis not present

## 2022-10-07 DIAGNOSIS — E538 Deficiency of other specified B group vitamins: Secondary | ICD-10-CM | POA: Diagnosis not present

## 2022-10-07 DIAGNOSIS — J449 Chronic obstructive pulmonary disease, unspecified: Secondary | ICD-10-CM | POA: Diagnosis not present

## 2022-10-07 DIAGNOSIS — E785 Hyperlipidemia, unspecified: Secondary | ICD-10-CM | POA: Diagnosis not present

## 2022-10-07 DIAGNOSIS — E1142 Type 2 diabetes mellitus with diabetic polyneuropathy: Secondary | ICD-10-CM | POA: Diagnosis not present

## 2022-10-07 DIAGNOSIS — Z7982 Long term (current) use of aspirin: Secondary | ICD-10-CM | POA: Diagnosis not present

## 2022-10-07 DIAGNOSIS — I4510 Unspecified right bundle-branch block: Secondary | ICD-10-CM | POA: Diagnosis not present

## 2022-10-07 DIAGNOSIS — F17210 Nicotine dependence, cigarettes, uncomplicated: Secondary | ICD-10-CM | POA: Diagnosis not present

## 2022-10-07 DIAGNOSIS — G894 Chronic pain syndrome: Secondary | ICD-10-CM | POA: Diagnosis not present

## 2022-10-07 DIAGNOSIS — M545 Low back pain, unspecified: Secondary | ICD-10-CM | POA: Diagnosis not present

## 2022-10-07 DIAGNOSIS — L97412 Non-pressure chronic ulcer of right heel and midfoot with fat layer exposed: Secondary | ICD-10-CM | POA: Diagnosis not present

## 2022-10-08 DIAGNOSIS — E1165 Type 2 diabetes mellitus with hyperglycemia: Secondary | ICD-10-CM | POA: Diagnosis not present

## 2022-10-10 ENCOUNTER — Ambulatory Visit: Payer: Medicare Other | Admitting: Internal Medicine

## 2022-10-17 DIAGNOSIS — E039 Hypothyroidism, unspecified: Secondary | ICD-10-CM | POA: Diagnosis not present

## 2022-10-17 DIAGNOSIS — F17210 Nicotine dependence, cigarettes, uncomplicated: Secondary | ICD-10-CM | POA: Diagnosis not present

## 2022-10-17 DIAGNOSIS — M1990 Unspecified osteoarthritis, unspecified site: Secondary | ICD-10-CM | POA: Diagnosis not present

## 2022-10-17 DIAGNOSIS — E785 Hyperlipidemia, unspecified: Secondary | ICD-10-CM | POA: Diagnosis not present

## 2022-10-17 DIAGNOSIS — G894 Chronic pain syndrome: Secondary | ICD-10-CM | POA: Diagnosis not present

## 2022-10-17 DIAGNOSIS — J449 Chronic obstructive pulmonary disease, unspecified: Secondary | ICD-10-CM | POA: Diagnosis not present

## 2022-10-17 DIAGNOSIS — K5909 Other constipation: Secondary | ICD-10-CM | POA: Diagnosis not present

## 2022-10-17 DIAGNOSIS — E11621 Type 2 diabetes mellitus with foot ulcer: Secondary | ICD-10-CM | POA: Diagnosis not present

## 2022-10-17 DIAGNOSIS — E559 Vitamin D deficiency, unspecified: Secondary | ICD-10-CM | POA: Diagnosis not present

## 2022-10-17 DIAGNOSIS — I4510 Unspecified right bundle-branch block: Secondary | ICD-10-CM | POA: Diagnosis not present

## 2022-10-17 DIAGNOSIS — L97412 Non-pressure chronic ulcer of right heel and midfoot with fat layer exposed: Secondary | ICD-10-CM | POA: Diagnosis not present

## 2022-10-17 DIAGNOSIS — M545 Low back pain, unspecified: Secondary | ICD-10-CM | POA: Diagnosis not present

## 2022-10-17 DIAGNOSIS — Z79891 Long term (current) use of opiate analgesic: Secondary | ICD-10-CM | POA: Diagnosis not present

## 2022-10-17 DIAGNOSIS — E538 Deficiency of other specified B group vitamins: Secondary | ICD-10-CM | POA: Diagnosis not present

## 2022-10-17 DIAGNOSIS — G43909 Migraine, unspecified, not intractable, without status migrainosus: Secondary | ICD-10-CM | POA: Diagnosis not present

## 2022-10-17 DIAGNOSIS — Z794 Long term (current) use of insulin: Secondary | ICD-10-CM | POA: Diagnosis not present

## 2022-10-17 DIAGNOSIS — Z7982 Long term (current) use of aspirin: Secondary | ICD-10-CM | POA: Diagnosis not present

## 2022-10-17 DIAGNOSIS — E1142 Type 2 diabetes mellitus with diabetic polyneuropathy: Secondary | ICD-10-CM | POA: Diagnosis not present

## 2022-10-19 DIAGNOSIS — Z79891 Long term (current) use of opiate analgesic: Secondary | ICD-10-CM | POA: Diagnosis not present

## 2022-10-19 DIAGNOSIS — F17210 Nicotine dependence, cigarettes, uncomplicated: Secondary | ICD-10-CM | POA: Diagnosis not present

## 2022-10-19 DIAGNOSIS — E538 Deficiency of other specified B group vitamins: Secondary | ICD-10-CM | POA: Diagnosis not present

## 2022-10-19 DIAGNOSIS — G43909 Migraine, unspecified, not intractable, without status migrainosus: Secondary | ICD-10-CM | POA: Diagnosis not present

## 2022-10-19 DIAGNOSIS — M1990 Unspecified osteoarthritis, unspecified site: Secondary | ICD-10-CM | POA: Diagnosis not present

## 2022-10-19 DIAGNOSIS — I4510 Unspecified right bundle-branch block: Secondary | ICD-10-CM | POA: Diagnosis not present

## 2022-10-19 DIAGNOSIS — Z794 Long term (current) use of insulin: Secondary | ICD-10-CM | POA: Diagnosis not present

## 2022-10-19 DIAGNOSIS — E039 Hypothyroidism, unspecified: Secondary | ICD-10-CM | POA: Diagnosis not present

## 2022-10-19 DIAGNOSIS — Z7982 Long term (current) use of aspirin: Secondary | ICD-10-CM | POA: Diagnosis not present

## 2022-10-19 DIAGNOSIS — E11621 Type 2 diabetes mellitus with foot ulcer: Secondary | ICD-10-CM | POA: Diagnosis not present

## 2022-10-19 DIAGNOSIS — M545 Low back pain, unspecified: Secondary | ICD-10-CM | POA: Diagnosis not present

## 2022-10-19 DIAGNOSIS — E559 Vitamin D deficiency, unspecified: Secondary | ICD-10-CM | POA: Diagnosis not present

## 2022-10-19 DIAGNOSIS — J449 Chronic obstructive pulmonary disease, unspecified: Secondary | ICD-10-CM | POA: Diagnosis not present

## 2022-10-19 DIAGNOSIS — E1142 Type 2 diabetes mellitus with diabetic polyneuropathy: Secondary | ICD-10-CM | POA: Diagnosis not present

## 2022-10-19 DIAGNOSIS — E785 Hyperlipidemia, unspecified: Secondary | ICD-10-CM | POA: Diagnosis not present

## 2022-10-19 DIAGNOSIS — G894 Chronic pain syndrome: Secondary | ICD-10-CM | POA: Diagnosis not present

## 2022-10-19 DIAGNOSIS — L97412 Non-pressure chronic ulcer of right heel and midfoot with fat layer exposed: Secondary | ICD-10-CM | POA: Diagnosis not present

## 2022-10-19 DIAGNOSIS — K5909 Other constipation: Secondary | ICD-10-CM | POA: Diagnosis not present

## 2022-10-21 DIAGNOSIS — L97412 Non-pressure chronic ulcer of right heel and midfoot with fat layer exposed: Secondary | ICD-10-CM | POA: Diagnosis not present

## 2022-10-21 DIAGNOSIS — J449 Chronic obstructive pulmonary disease, unspecified: Secondary | ICD-10-CM | POA: Diagnosis not present

## 2022-10-21 DIAGNOSIS — E538 Deficiency of other specified B group vitamins: Secondary | ICD-10-CM | POA: Diagnosis not present

## 2022-10-21 DIAGNOSIS — F17210 Nicotine dependence, cigarettes, uncomplicated: Secondary | ICD-10-CM | POA: Diagnosis not present

## 2022-10-21 DIAGNOSIS — I4510 Unspecified right bundle-branch block: Secondary | ICD-10-CM | POA: Diagnosis not present

## 2022-10-21 DIAGNOSIS — M545 Low back pain, unspecified: Secondary | ICD-10-CM | POA: Diagnosis not present

## 2022-10-21 DIAGNOSIS — M1990 Unspecified osteoarthritis, unspecified site: Secondary | ICD-10-CM | POA: Diagnosis not present

## 2022-10-21 DIAGNOSIS — E559 Vitamin D deficiency, unspecified: Secondary | ICD-10-CM | POA: Diagnosis not present

## 2022-10-21 DIAGNOSIS — E11621 Type 2 diabetes mellitus with foot ulcer: Secondary | ICD-10-CM | POA: Diagnosis not present

## 2022-10-21 DIAGNOSIS — E1142 Type 2 diabetes mellitus with diabetic polyneuropathy: Secondary | ICD-10-CM | POA: Diagnosis not present

## 2022-10-21 DIAGNOSIS — K5909 Other constipation: Secondary | ICD-10-CM | POA: Diagnosis not present

## 2022-10-21 DIAGNOSIS — Z794 Long term (current) use of insulin: Secondary | ICD-10-CM | POA: Diagnosis not present

## 2022-10-21 DIAGNOSIS — E039 Hypothyroidism, unspecified: Secondary | ICD-10-CM | POA: Diagnosis not present

## 2022-10-21 DIAGNOSIS — Z79891 Long term (current) use of opiate analgesic: Secondary | ICD-10-CM | POA: Diagnosis not present

## 2022-10-21 DIAGNOSIS — Z7982 Long term (current) use of aspirin: Secondary | ICD-10-CM | POA: Diagnosis not present

## 2022-10-21 DIAGNOSIS — G43909 Migraine, unspecified, not intractable, without status migrainosus: Secondary | ICD-10-CM | POA: Diagnosis not present

## 2022-10-21 DIAGNOSIS — E785 Hyperlipidemia, unspecified: Secondary | ICD-10-CM | POA: Diagnosis not present

## 2022-10-21 DIAGNOSIS — G894 Chronic pain syndrome: Secondary | ICD-10-CM | POA: Diagnosis not present

## 2022-10-24 DIAGNOSIS — K5909 Other constipation: Secondary | ICD-10-CM | POA: Diagnosis not present

## 2022-10-24 DIAGNOSIS — E039 Hypothyroidism, unspecified: Secondary | ICD-10-CM | POA: Diagnosis not present

## 2022-10-24 DIAGNOSIS — E1142 Type 2 diabetes mellitus with diabetic polyneuropathy: Secondary | ICD-10-CM | POA: Diagnosis not present

## 2022-10-24 DIAGNOSIS — J449 Chronic obstructive pulmonary disease, unspecified: Secondary | ICD-10-CM | POA: Diagnosis not present

## 2022-10-24 DIAGNOSIS — G43909 Migraine, unspecified, not intractable, without status migrainosus: Secondary | ICD-10-CM | POA: Diagnosis not present

## 2022-10-24 DIAGNOSIS — E559 Vitamin D deficiency, unspecified: Secondary | ICD-10-CM | POA: Diagnosis not present

## 2022-10-24 DIAGNOSIS — E785 Hyperlipidemia, unspecified: Secondary | ICD-10-CM | POA: Diagnosis not present

## 2022-10-24 DIAGNOSIS — Z7982 Long term (current) use of aspirin: Secondary | ICD-10-CM | POA: Diagnosis not present

## 2022-10-24 DIAGNOSIS — Z79891 Long term (current) use of opiate analgesic: Secondary | ICD-10-CM | POA: Diagnosis not present

## 2022-10-24 DIAGNOSIS — I4510 Unspecified right bundle-branch block: Secondary | ICD-10-CM | POA: Diagnosis not present

## 2022-10-24 DIAGNOSIS — M1990 Unspecified osteoarthritis, unspecified site: Secondary | ICD-10-CM | POA: Diagnosis not present

## 2022-10-24 DIAGNOSIS — E538 Deficiency of other specified B group vitamins: Secondary | ICD-10-CM | POA: Diagnosis not present

## 2022-10-24 DIAGNOSIS — L97412 Non-pressure chronic ulcer of right heel and midfoot with fat layer exposed: Secondary | ICD-10-CM | POA: Diagnosis not present

## 2022-10-24 DIAGNOSIS — Z794 Long term (current) use of insulin: Secondary | ICD-10-CM | POA: Diagnosis not present

## 2022-10-24 DIAGNOSIS — E11621 Type 2 diabetes mellitus with foot ulcer: Secondary | ICD-10-CM | POA: Diagnosis not present

## 2022-10-24 DIAGNOSIS — M545 Low back pain, unspecified: Secondary | ICD-10-CM | POA: Diagnosis not present

## 2022-10-24 DIAGNOSIS — F17210 Nicotine dependence, cigarettes, uncomplicated: Secondary | ICD-10-CM | POA: Diagnosis not present

## 2022-10-24 DIAGNOSIS — G894 Chronic pain syndrome: Secondary | ICD-10-CM | POA: Diagnosis not present

## 2022-10-25 ENCOUNTER — Ambulatory Visit (INDEPENDENT_AMBULATORY_CARE_PROVIDER_SITE_OTHER): Payer: Medicare Other | Admitting: Podiatry

## 2022-10-25 DIAGNOSIS — L97412 Non-pressure chronic ulcer of right heel and midfoot with fat layer exposed: Secondary | ICD-10-CM

## 2022-10-25 DIAGNOSIS — E11621 Type 2 diabetes mellitus with foot ulcer: Secondary | ICD-10-CM

## 2022-10-25 MED ORDER — SANTYL 250 UNIT/GM EX OINT: 1.0000 | TOPICAL_OINTMENT | Freq: Every day | CUTANEOUS | 0 refills | Status: DC

## 2022-10-25 NOTE — Progress Notes (Signed)
Subjective:  Patient ID: Katherine Herrera, female    DOB: 1966/10/28,  MRN: [id number]  Chief Complaint  Patient presents with   Foot Ulcer    DOS: 04/13/2022 Procedure: Right foot revisional I&D washout with Integra application/VAC  56 y.o. female returns for post-op check.  Patient states that she is doing okay.  Will hold off on VAC.  Patient states that the nursing felt a different wound care product will be better at this point the wound VAC has been discontinued Review of Systems: Negative except as noted in the HPI. Denies N/V/F/Ch.  Past Medical History:  Diagnosis Date   Anxiety    Arthritis    hands   B12 deficiency    Chronic constipation    Chronic pain syndrome    neck, lower back, and foot   COPD (chronic obstructive pulmonary disease) ([place])    Diabetic ulcer of right foot ([place])    Gout    possible   History of seizure    05-26-2020  per pt as child and last one in late teen's   History of sepsis    due to lower extremity cellulits 2019 left , right 03/ 2021   History of syncope    Hyperlipidemia    Hypothyroidism    Impaired range of motion of cervical spine    per pt hx cervical fusion C4 -- 7,  has to be careful about turning her head to the right can cause her to pass out   Insulin dependent type 2 diabetes mellitus [facility])    endocrinologist--- dr Chalmers Cater    (05-26-2020 per pt check's blood sugar twice dialy and at times a third time,  fasting am blood sugar's---- 200 -- 250)   LBP (low back pain)    DR Patrice Paradise   Migraine    Peripheral neuropathy    RBBB (right bundle branch block)    Tenosynovitis of foot and ankle 09/03/2013   TTS (tarsal tunnel syndrome) 2010   Left Foot [facility] in [place]   Vitamin D deficiency    Wears glasses     Current Outpatient Medications:    collagenase (SANTYL) 250 UNIT/GM ointment, Apply 1 Application topically daily., Disp: 15 g, Rfl: 0   B-D INS SYR ULTRAFINE 1CC/31G 31G X 5/16" 1 ML MISC, USE AS DIRECTED, Disp: 100 each, Rfl:  0   baclofen (LIORESAL) 10 MG tablet, TAKE 2 TABLETS BY MOUTH TWICE  DAILY, Disp: 180 tablet, Rfl: 1   bisacodyl (DULCOLAX) 5 MG EC tablet, Take 10 mg by mouth at bedtime., Disp: , Rfl:    Blood Glucose Monitoring Suppl (ONETOUCH VERIO) w/Device KIT, , Disp: , Rfl:    cholecalciferol (VITAMIN D3) 25 MCG (1000 UNIT) tablet, Take 1,000 Units by mouth daily., Disp: , Rfl:    clonazePAM (KLONOPIN) 0.5 MG tablet, Take 1 tablet (0.5 mg total) by mouth 2 (two) times daily as needed for up to 5 days for anxiety., Disp: 60 tablet, Rfl: 1   colchicine 0.6 MG tablet, TAKE 2 TABLETS AS NEEDED GOUT ATTACK. THEN TAKE ANOTHER 1 TAB IN 1-2 HOURS. DONT REPEAT FOR 3 DAYS. (Patient taking differently: Take 0.6 mg by mouth daily as needed (gout).), Disp: 18 tablet, Rfl: 1   docusate sodium (COLACE) 100 MG capsule, Take 100 mg by mouth at bedtime., Disp: , Rfl:    ezetimibe (ZETIA) 10 MG tablet, TAKE 1 TABLET BY MOUTH DAILY, Disp: 100 tablet, Rfl: 2   FLUoxetine (PROZAC) 10 MG capsule, Take 1 capsule (  10 mg total) by mouth daily., Disp: 90 capsule, Rfl: 3   gabapentin (NEURONTIN) 800 MG tablet, Take 1 tablet (800 mg total) by mouth 4 (four) times daily. Annual appt due in Dec w/labs must see provider for future refills, Disp: 400 tablet, Rfl: 0   insulin NPH-regular Human (70-30) 100 UNIT/ML injection, Inject 20 Units into the skin 2 (two) times daily with a meal. (Patient taking differently: Inject 30-40 Units into the skin See admin instructions. 30 units in the morning and afternoon and 40 units at night), Disp: 10 mL, Rfl: 11   lamoTRIgine (LAMICTAL) 25 MG tablet, TAKE 1 TABLET BY MOUTH TWICE  DAILY, Disp: 200 tablet, Rfl: 2   Lancet Devices (ACCU-CHEK SOFTCLIX) lancets, Test two times daily, Disp: 100 each, Rfl: 12   Lancets (ONETOUCH DELICA PLUS [id number]) MISC, , Disp: , Rfl:    levothyroxine (SYNTHROID) 75 MCG tablet, Take 1 tablet (75 mcg total) by mouth daily before breakfast. Annual appt due in Dec w/labs must  see provider for future refills, Disp: 100 tablet, Rfl: 0   lisinopril (ZESTRIL) 10 MG tablet, TAKE 1 TABLET BY MOUTH DAILY, Disp: 100 tablet, Rfl: 2   loratadine (CLARITIN) 10 MG tablet, Take 1 tablet (10 mg total) by mouth daily as needed for allergies., Disp: 100 tablet, Rfl: 3   ondansetron (ZOFRAN) 4 MG tablet, Take 4 mg by mouth every 8 (eight) hours as needed for nausea or vomiting., Disp: , Rfl:    oxyCODONE (ROXICODONE) 15 MG immediate release tablet, Take 1 tablet (15 mg total) by mouth every 6 (six) hours as needed for pain., Disp: 120 tablet, Rfl: 0   oxyCODONE (ROXICODONE) 15 MG immediate release tablet, Take 1 tablet (15 mg total) by mouth every 6 (six) hours as needed for pain., Disp: 120 tablet, Rfl: 0   rOPINIRole (REQUIP) 0.5 MG tablet, Take 1 tablet (0.5 mg total) by mouth at bedtime. Annual appt due in Dec w/labs must see provider for future refills, Disp: 100 tablet, Rfl: 0   silver sulfADIAZINE (SILVADENE) 1 % cream, APPLY PEA-SIZED AMOUNT TO WOUND DAILY., Disp: 50 g, Rfl: 0   vitamin B-12 (CYANOCOBALAMIN) 1000 MCG tablet, Take 1,000 mcg by mouth daily., Disp: , Rfl:   Social History   Tobacco Use  Smoking Status Every Day   Packs/day: 1.00   Years: 10.00   Total pack years: 10.00   Types: Cigarettes  Smokeless Tobacco Never  Tobacco Comments   Trying to quit, smoking 1 ppd    Allergies  Allergen Reactions   Cephalexin Nausea And Vomiting   Demerol  [Meperidine Hcl] Rash   Lovastatin Other (See Comments)    Possible myalgia    Metformin And Related Nausea And Vomiting   Sulfamethoxazole-Trimethoprim     Other reaction(s): Unknown DILI, pancreatitis   Crestor [Rosuvastatin Calcium]     myalgia   Hydromorphone     Other reaction(s): Unknown   Niacin Other (See Comments)    Unknown    Other Other (See Comments)   Hydromorphone Hcl Itching    Patient has been tolerating Hydromorphone tablets without adverse effect (07/09/19) Other reaction(s): Unknown    Paroxetine Other (See Comments)    Unknown    Objective:  There were no vitals filed for this visit. There is no height or weight on file to calculate BMI. Constitutional Well developed. Well nourished.  Vascular Foot warm and well perfused. Capillary refill normal to all digits.   Neurologic Normal speech. Oriented to person, place,  and time. Epicritic sensation to light touch grossly present bilaterally.  Dermatologic Granular wound bed noted to the right foot.  Debridement was carried out minimally.  No purulent drainage noted no malodor present.  Some maceration.  Orthopedic: Mild tenderness to palpation noted about the surgical site.   Radiographs: None Assessment:   No diagnosis found.     Plan:  Patient was evaluated and treated and all questions answered.  S/p foot surgery right -Progressing as expected post-operatively. -XR: See above -WB Status: Partial weightbearing to the heel/nonweightbearing in walker -Granular wound bed noted.  Wound size is decreasing. -Medications: None -We will transition to Santyl wet-to-dry.  Hold off on wound VAC -Minimal debridement was carried out in standard technique.  No complication noted.  No follow-ups on file.   Right fifth metatarsal base wound improving considerably.  Will transition to Santyl wet-to-dry

## 2022-10-27 DIAGNOSIS — G43909 Migraine, unspecified, not intractable, without status migrainosus: Secondary | ICD-10-CM | POA: Diagnosis not present

## 2022-10-27 DIAGNOSIS — M545 Low back pain, unspecified: Secondary | ICD-10-CM | POA: Diagnosis not present

## 2022-10-27 DIAGNOSIS — E559 Vitamin D deficiency, unspecified: Secondary | ICD-10-CM | POA: Diagnosis not present

## 2022-10-27 DIAGNOSIS — M1990 Unspecified osteoarthritis, unspecified site: Secondary | ICD-10-CM | POA: Diagnosis not present

## 2022-10-27 DIAGNOSIS — E039 Hypothyroidism, unspecified: Secondary | ICD-10-CM | POA: Diagnosis not present

## 2022-10-27 DIAGNOSIS — E785 Hyperlipidemia, unspecified: Secondary | ICD-10-CM | POA: Diagnosis not present

## 2022-10-27 DIAGNOSIS — I4510 Unspecified right bundle-branch block: Secondary | ICD-10-CM | POA: Diagnosis not present

## 2022-10-27 DIAGNOSIS — G894 Chronic pain syndrome: Secondary | ICD-10-CM | POA: Diagnosis not present

## 2022-10-27 DIAGNOSIS — Z7982 Long term (current) use of aspirin: Secondary | ICD-10-CM | POA: Diagnosis not present

## 2022-10-27 DIAGNOSIS — J449 Chronic obstructive pulmonary disease, unspecified: Secondary | ICD-10-CM | POA: Diagnosis not present

## 2022-10-27 DIAGNOSIS — E1142 Type 2 diabetes mellitus with diabetic polyneuropathy: Secondary | ICD-10-CM | POA: Diagnosis not present

## 2022-10-27 DIAGNOSIS — K5909 Other constipation: Secondary | ICD-10-CM | POA: Diagnosis not present

## 2022-10-27 DIAGNOSIS — Z794 Long term (current) use of insulin: Secondary | ICD-10-CM | POA: Diagnosis not present

## 2022-10-27 DIAGNOSIS — L97412 Non-pressure chronic ulcer of right heel and midfoot with fat layer exposed: Secondary | ICD-10-CM | POA: Diagnosis not present

## 2022-10-27 DIAGNOSIS — E538 Deficiency of other specified B group vitamins: Secondary | ICD-10-CM | POA: Diagnosis not present

## 2022-10-27 DIAGNOSIS — E11621 Type 2 diabetes mellitus with foot ulcer: Secondary | ICD-10-CM | POA: Diagnosis not present

## 2022-10-27 DIAGNOSIS — F17210 Nicotine dependence, cigarettes, uncomplicated: Secondary | ICD-10-CM | POA: Diagnosis not present

## 2022-10-27 DIAGNOSIS — Z79891 Long term (current) use of opiate analgesic: Secondary | ICD-10-CM | POA: Diagnosis not present

## 2022-10-28 DIAGNOSIS — M86671 Other chronic osteomyelitis, right ankle and foot: Secondary | ICD-10-CM | POA: Diagnosis not present

## 2022-10-29 DIAGNOSIS — L02611 Cutaneous abscess of right foot: Secondary | ICD-10-CM | POA: Diagnosis not present

## 2022-10-29 DIAGNOSIS — E08621 Diabetes mellitus due to underlying condition with foot ulcer: Secondary | ICD-10-CM | POA: Diagnosis not present

## 2022-10-29 DIAGNOSIS — M86671 Other chronic osteomyelitis, right ankle and foot: Secondary | ICD-10-CM | POA: Diagnosis not present

## 2022-11-01 DIAGNOSIS — E785 Hyperlipidemia, unspecified: Secondary | ICD-10-CM | POA: Diagnosis not present

## 2022-11-01 DIAGNOSIS — G894 Chronic pain syndrome: Secondary | ICD-10-CM | POA: Diagnosis not present

## 2022-11-01 DIAGNOSIS — E039 Hypothyroidism, unspecified: Secondary | ICD-10-CM | POA: Diagnosis not present

## 2022-11-01 DIAGNOSIS — I4510 Unspecified right bundle-branch block: Secondary | ICD-10-CM | POA: Diagnosis not present

## 2022-11-01 DIAGNOSIS — J449 Chronic obstructive pulmonary disease, unspecified: Secondary | ICD-10-CM | POA: Diagnosis not present

## 2022-11-01 DIAGNOSIS — E559 Vitamin D deficiency, unspecified: Secondary | ICD-10-CM | POA: Diagnosis not present

## 2022-11-01 DIAGNOSIS — K5909 Other constipation: Secondary | ICD-10-CM | POA: Diagnosis not present

## 2022-11-01 DIAGNOSIS — Z79891 Long term (current) use of opiate analgesic: Secondary | ICD-10-CM | POA: Diagnosis not present

## 2022-11-01 DIAGNOSIS — Z794 Long term (current) use of insulin: Secondary | ICD-10-CM | POA: Diagnosis not present

## 2022-11-01 DIAGNOSIS — Z7982 Long term (current) use of aspirin: Secondary | ICD-10-CM | POA: Diagnosis not present

## 2022-11-01 DIAGNOSIS — F17210 Nicotine dependence, cigarettes, uncomplicated: Secondary | ICD-10-CM | POA: Diagnosis not present

## 2022-11-01 DIAGNOSIS — G43909 Migraine, unspecified, not intractable, without status migrainosus: Secondary | ICD-10-CM | POA: Diagnosis not present

## 2022-11-01 DIAGNOSIS — L97412 Non-pressure chronic ulcer of right heel and midfoot with fat layer exposed: Secondary | ICD-10-CM | POA: Diagnosis not present

## 2022-11-01 DIAGNOSIS — M1990 Unspecified osteoarthritis, unspecified site: Secondary | ICD-10-CM | POA: Diagnosis not present

## 2022-11-01 DIAGNOSIS — E1142 Type 2 diabetes mellitus with diabetic polyneuropathy: Secondary | ICD-10-CM | POA: Diagnosis not present

## 2022-11-01 DIAGNOSIS — E538 Deficiency of other specified B group vitamins: Secondary | ICD-10-CM | POA: Diagnosis not present

## 2022-11-01 DIAGNOSIS — E11621 Type 2 diabetes mellitus with foot ulcer: Secondary | ICD-10-CM | POA: Diagnosis not present

## 2022-11-01 DIAGNOSIS — M545 Low back pain, unspecified: Secondary | ICD-10-CM | POA: Diagnosis not present

## 2022-11-08 DIAGNOSIS — E538 Deficiency of other specified B group vitamins: Secondary | ICD-10-CM | POA: Diagnosis not present

## 2022-11-08 DIAGNOSIS — M545 Low back pain, unspecified: Secondary | ICD-10-CM | POA: Diagnosis not present

## 2022-11-08 DIAGNOSIS — Z7982 Long term (current) use of aspirin: Secondary | ICD-10-CM | POA: Diagnosis not present

## 2022-11-08 DIAGNOSIS — E039 Hypothyroidism, unspecified: Secondary | ICD-10-CM | POA: Diagnosis not present

## 2022-11-08 DIAGNOSIS — J449 Chronic obstructive pulmonary disease, unspecified: Secondary | ICD-10-CM | POA: Diagnosis not present

## 2022-11-08 DIAGNOSIS — M1990 Unspecified osteoarthritis, unspecified site: Secondary | ICD-10-CM | POA: Diagnosis not present

## 2022-11-08 DIAGNOSIS — F17210 Nicotine dependence, cigarettes, uncomplicated: Secondary | ICD-10-CM | POA: Diagnosis not present

## 2022-11-08 DIAGNOSIS — I4510 Unspecified right bundle-branch block: Secondary | ICD-10-CM | POA: Diagnosis not present

## 2022-11-08 DIAGNOSIS — E1165 Type 2 diabetes mellitus with hyperglycemia: Secondary | ICD-10-CM | POA: Diagnosis not present

## 2022-11-08 DIAGNOSIS — L97412 Non-pressure chronic ulcer of right heel and midfoot with fat layer exposed: Secondary | ICD-10-CM | POA: Diagnosis not present

## 2022-11-08 DIAGNOSIS — K5909 Other constipation: Secondary | ICD-10-CM | POA: Diagnosis not present

## 2022-11-08 DIAGNOSIS — G894 Chronic pain syndrome: Secondary | ICD-10-CM | POA: Diagnosis not present

## 2022-11-08 DIAGNOSIS — Z794 Long term (current) use of insulin: Secondary | ICD-10-CM | POA: Diagnosis not present

## 2022-11-08 DIAGNOSIS — Z79891 Long term (current) use of opiate analgesic: Secondary | ICD-10-CM | POA: Diagnosis not present

## 2022-11-08 DIAGNOSIS — E11621 Type 2 diabetes mellitus with foot ulcer: Secondary | ICD-10-CM | POA: Diagnosis not present

## 2022-11-08 DIAGNOSIS — E559 Vitamin D deficiency, unspecified: Secondary | ICD-10-CM | POA: Diagnosis not present

## 2022-11-08 DIAGNOSIS — E1142 Type 2 diabetes mellitus with diabetic polyneuropathy: Secondary | ICD-10-CM | POA: Diagnosis not present

## 2022-11-08 DIAGNOSIS — E785 Hyperlipidemia, unspecified: Secondary | ICD-10-CM | POA: Diagnosis not present

## 2022-11-08 DIAGNOSIS — G43909 Migraine, unspecified, not intractable, without status migrainosus: Secondary | ICD-10-CM | POA: Diagnosis not present

## 2022-11-15 ENCOUNTER — Ambulatory Visit (INDEPENDENT_AMBULATORY_CARE_PROVIDER_SITE_OTHER): Payer: 59 | Admitting: Podiatry

## 2022-11-15 DIAGNOSIS — E1142 Type 2 diabetes mellitus with diabetic polyneuropathy: Secondary | ICD-10-CM | POA: Diagnosis not present

## 2022-11-15 DIAGNOSIS — L97412 Non-pressure chronic ulcer of right heel and midfoot with fat layer exposed: Secondary | ICD-10-CM

## 2022-11-15 DIAGNOSIS — E11621 Type 2 diabetes mellitus with foot ulcer: Secondary | ICD-10-CM

## 2022-11-15 NOTE — Progress Notes (Signed)
Subjective:  Patient ID: Katherine Herrera, female    DOB: 07/29/1966,  MRN: [id number]  Chief Complaint  Patient presents with   Foot Ulcer    DOS: 04/13/2022 Procedure: Right foot revisional I&D washout with Integra application/VAC  57 y.o. female returns for post-op check.  Patient states that she is doing okay.  She states his hand is doing great.  No acute complaints. Review of Systems: Negative except as noted in the HPI. Denies N/V/F/Ch.  Past Medical History:  Diagnosis Date   Anxiety    Arthritis    hands   B12 deficiency    Chronic constipation    Chronic pain syndrome    neck, lower back, and foot   COPD (chronic obstructive pulmonary disease) ([place])    Diabetic ulcer of right foot ([place])    Gout    possible   History of seizure    05-26-2020  per pt as child and last one in late teen's   History of sepsis    due to lower extremity cellulits 2019 left , right 03/ 2021   History of syncope    Hyperlipidemia    Hypothyroidism    Impaired range of motion of cervical spine    per pt hx cervical fusion C4 -- 7,  has to be careful about turning her head to the right can cause her to pass out   Insulin dependent type 2 diabetes mellitus [facility])    endocrinologist--- dr Chalmers Cater    (05-26-2020 per pt check's blood sugar twice dialy and at times a third time,  fasting am blood sugar's---- 200 -- 250)   LBP (low back pain)    DR Patrice Paradise   Migraine    Peripheral neuropathy    RBBB (right bundle branch block)    Tenosynovitis of foot and ankle 09/03/2013   TTS (tarsal tunnel syndrome) 2010   Left Foot [facility] in [place]   Vitamin D deficiency    Wears glasses     Current Outpatient Medications:    B-D INS SYR ULTRAFINE 1CC/31G 31G X 5/16" 1 ML MISC, USE AS DIRECTED, Disp: 100 each, Rfl: 0   baclofen (LIORESAL) 10 MG tablet, TAKE 2 TABLETS BY MOUTH TWICE  DAILY, Disp: 180 tablet, Rfl: 1   bisacodyl (DULCOLAX) 5 MG EC tablet, Take 10 mg by mouth at bedtime., Disp: , Rfl:    Blood  Glucose Monitoring Suppl (ONETOUCH VERIO) w/Device KIT, , Disp: , Rfl:    cholecalciferol (VITAMIN D3) 25 MCG (1000 UNIT) tablet, Take 1,000 Units by mouth daily., Disp: , Rfl:    clonazePAM (KLONOPIN) 0.5 MG tablet, Take 1 tablet (0.5 mg total) by mouth 2 (two) times daily as needed for up to 5 days for anxiety., Disp: 60 tablet, Rfl: 1   colchicine 0.6 MG tablet, TAKE 2 TABLETS AS NEEDED GOUT ATTACK. THEN TAKE ANOTHER 1 TAB IN 1-2 HOURS. DONT REPEAT FOR 3 DAYS. (Patient taking differently: Take 0.6 mg by mouth daily as needed (gout).), Disp: 18 tablet, Rfl: 1   collagenase (SANTYL) 250 UNIT/GM ointment, Apply 1 Application topically daily., Disp: 15 g, Rfl: 0   docusate sodium (COLACE) 100 MG capsule, Take 100 mg by mouth at bedtime., Disp: , Rfl:    ezetimibe (ZETIA) 10 MG tablet, TAKE 1 TABLET BY MOUTH DAILY, Disp: 100 tablet, Rfl: 2   FLUoxetine (PROZAC) 10 MG capsule, Take 1 capsule (10 mg total) by mouth daily., Disp: 90 capsule, Rfl: 3   gabapentin (NEURONTIN) 800 MG tablet,  Take 1 tablet (800 mg total) by mouth 4 (four) times daily. Annual appt due in Dec w/labs must see provider for future refills, Disp: 400 tablet, Rfl: 0   insulin NPH-regular Human (70-30) 100 UNIT/ML injection, Inject 20 Units into the skin 2 (two) times daily with a meal. (Patient taking differently: Inject 30-40 Units into the skin See admin instructions. 30 units in the morning and afternoon and 40 units at night), Disp: 10 mL, Rfl: 11   lamoTRIgine (LAMICTAL) 25 MG tablet, TAKE 1 TABLET BY MOUTH TWICE  DAILY, Disp: 200 tablet, Rfl: 2   Lancet Devices (ACCU-CHEK SOFTCLIX) lancets, Test two times daily, Disp: 100 each, Rfl: 12   Lancets (ONETOUCH DELICA PLUS [id number]) MISC, , Disp: , Rfl:    levothyroxine (SYNTHROID) 75 MCG tablet, Take 1 tablet (75 mcg total) by mouth daily before breakfast. Annual appt due in Dec w/labs must see provider for future refills, Disp: 100 tablet, Rfl: 0   lisinopril (ZESTRIL) 10 MG tablet,  TAKE 1 TABLET BY MOUTH DAILY, Disp: 100 tablet, Rfl: 2   loratadine (CLARITIN) 10 MG tablet, Take 1 tablet (10 mg total) by mouth daily as needed for allergies., Disp: 100 tablet, Rfl: 3   ondansetron (ZOFRAN) 4 MG tablet, Take 4 mg by mouth every 8 (eight) hours as needed for nausea or vomiting., Disp: , Rfl:    oxyCODONE (ROXICODONE) 15 MG immediate release tablet, Take 1 tablet (15 mg total) by mouth every 6 (six) hours as needed for pain., Disp: 120 tablet, Rfl: 0   oxyCODONE (ROXICODONE) 15 MG immediate release tablet, Take 1 tablet (15 mg total) by mouth every 6 (six) hours as needed for pain., Disp: 120 tablet, Rfl: 0   rOPINIRole (REQUIP) 0.5 MG tablet, Take 1 tablet (0.5 mg total) by mouth at bedtime. Annual appt due in Dec w/labs must see provider for future refills, Disp: 100 tablet, Rfl: 0   silver sulfADIAZINE (SILVADENE) 1 % cream, APPLY PEA-SIZED AMOUNT TO WOUND DAILY., Disp: 50 g, Rfl: 0   vitamin B-12 (CYANOCOBALAMIN) 1000 MCG tablet, Take 1,000 mcg by mouth daily., Disp: , Rfl:   Social History   Tobacco Use  Smoking Status Every Day   Packs/day: 1.00   Years: 10.00   Total pack years: 10.00   Types: Cigarettes  Smokeless Tobacco Never  Tobacco Comments   Trying to quit, smoking 1 ppd    Allergies  Allergen Reactions   Cephalexin Nausea And Vomiting   Demerol  [Meperidine Hcl] Rash   Lovastatin Other (See Comments)    Possible myalgia    Metformin And Related Nausea And Vomiting   Sulfamethoxazole-Trimethoprim     Other reaction(s): Unknown DILI, pancreatitis   Crestor [Rosuvastatin Calcium]     myalgia   Hydromorphone     Other reaction(s): Unknown   Niacin Other (See Comments)    Unknown    Other Other (See Comments)   Hydromorphone Hcl Itching    Patient has been tolerating Hydromorphone tablets without adverse effect (07/09/19) Other reaction(s): Unknown   Paroxetine Other (See Comments)    Unknown    Objective:  There were no vitals filed for this  visit. There is no height or weight on file to calculate BMI. Constitutional Well developed. Well nourished.  Vascular Foot warm and well perfused. Capillary refill normal to all digits.   Neurologic Normal speech. Oriented to person, place, and time. Epicritic sensation to light touch grossly present bilaterally.  Dermatologic Granular wound bed noted to the  right foot.  Debridement was carried out minimally.  No purulent drainage noted no malodor present.    Orthopedic: Mild tenderness to palpation noted about the surgical site.   Radiographs: None Assessment:   No diagnosis found.     Plan:  Patient was evaluated and treated and all questions answered.  S/p foot surgery right -Progressing as expected post-operatively. -XR: See above -WB Status: Partial weightbearing to the heel/nonweightbearing in walker -Granular wound bed noted.  Wound size is decreasing. -Medications: None -Continue Santyl wet-to-dry dressing -Minimal debridement was carried out in standard technique.  No complication noted. -Patient may benefit from bracing versus posterior tibial tendon transfer/resection in the future   No follow-ups on file.

## 2022-11-16 ENCOUNTER — Ambulatory Visit (INDEPENDENT_AMBULATORY_CARE_PROVIDER_SITE_OTHER): Payer: 59 | Admitting: Internal Medicine

## 2022-11-16 ENCOUNTER — Encounter: Payer: Self-pay | Admitting: Internal Medicine

## 2022-11-16 VITALS — BP 122/62 | HR 84 | Temp 98.8°F | Ht 69.0 in | Wt 204.8 lb

## 2022-11-16 DIAGNOSIS — E11621 Type 2 diabetes mellitus with foot ulcer: Secondary | ICD-10-CM | POA: Diagnosis not present

## 2022-11-16 DIAGNOSIS — I10 Essential (primary) hypertension: Secondary | ICD-10-CM | POA: Diagnosis not present

## 2022-11-16 DIAGNOSIS — E1140 Type 2 diabetes mellitus with diabetic neuropathy, unspecified: Secondary | ICD-10-CM

## 2022-11-16 DIAGNOSIS — E1142 Type 2 diabetes mellitus with diabetic polyneuropathy: Secondary | ICD-10-CM

## 2022-11-16 DIAGNOSIS — M545 Low back pain, unspecified: Secondary | ICD-10-CM | POA: Diagnosis not present

## 2022-11-16 DIAGNOSIS — M1990 Unspecified osteoarthritis, unspecified site: Secondary | ICD-10-CM | POA: Diagnosis not present

## 2022-11-16 DIAGNOSIS — J449 Chronic obstructive pulmonary disease, unspecified: Secondary | ICD-10-CM | POA: Diagnosis not present

## 2022-11-16 DIAGNOSIS — E559 Vitamin D deficiency, unspecified: Secondary | ICD-10-CM | POA: Diagnosis not present

## 2022-11-16 DIAGNOSIS — E039 Hypothyroidism, unspecified: Secondary | ICD-10-CM | POA: Diagnosis not present

## 2022-11-16 DIAGNOSIS — Z79891 Long term (current) use of opiate analgesic: Secondary | ICD-10-CM | POA: Diagnosis not present

## 2022-11-16 DIAGNOSIS — I4510 Unspecified right bundle-branch block: Secondary | ICD-10-CM | POA: Diagnosis not present

## 2022-11-16 DIAGNOSIS — G894 Chronic pain syndrome: Secondary | ICD-10-CM

## 2022-11-16 DIAGNOSIS — E785 Hyperlipidemia, unspecified: Secondary | ICD-10-CM | POA: Diagnosis not present

## 2022-11-16 DIAGNOSIS — Z794 Long term (current) use of insulin: Secondary | ICD-10-CM | POA: Diagnosis not present

## 2022-11-16 DIAGNOSIS — K5909 Other constipation: Secondary | ICD-10-CM | POA: Diagnosis not present

## 2022-11-16 DIAGNOSIS — Z7982 Long term (current) use of aspirin: Secondary | ICD-10-CM | POA: Diagnosis not present

## 2022-11-16 DIAGNOSIS — E538 Deficiency of other specified B group vitamins: Secondary | ICD-10-CM | POA: Diagnosis not present

## 2022-11-16 DIAGNOSIS — F17210 Nicotine dependence, cigarettes, uncomplicated: Secondary | ICD-10-CM | POA: Diagnosis not present

## 2022-11-16 DIAGNOSIS — L97412 Non-pressure chronic ulcer of right heel and midfoot with fat layer exposed: Secondary | ICD-10-CM | POA: Diagnosis not present

## 2022-11-16 DIAGNOSIS — G43909 Migraine, unspecified, not intractable, without status migrainosus: Secondary | ICD-10-CM | POA: Diagnosis not present

## 2022-11-16 MED ORDER — OXYCODONE HCL 15 MG PO TABS: 15.0000 mg | ORAL_TABLET | Freq: Four times a day (QID) | ORAL | 0 refills | Status: DC | PRN

## 2022-11-16 NOTE — Assessment & Plan Note (Signed)
BP Readings from Last 3 Encounters:  11/16/22 122/62  07/11/22 (!) 108/52  05/09/22 (!) 90/50

## 2022-11-16 NOTE — Assessment & Plan Note (Signed)
On OXYcodone prn  Potential benefits of a long term opioids use as well as potential risks (i.e. addiction risk, apnea etc) and complications (i.e. Somnolence, constipation and others) were explained to the patient and were aknowledged.

## 2022-11-16 NOTE — Assessment & Plan Note (Signed)
CBG<120 On Insulin 70/30; Libre 3 for CGM

## 2022-11-16 NOTE — Assessment & Plan Note (Signed)
CBGs are better on Libre 2

## 2022-11-16 NOTE — Progress Notes (Signed)
Subjective:  Patient ID: Katherine Herrera, female    DOB: May 22, 1966  Age: 57 y.o. MRN: [id number]  CC: Follow-up (Medication refill )   HPI Katherine Herrera presents for chronic pain, anxiety, DM   Outpatient Medications Prior to Visit  Medication Sig Dispense Refill   albuterol (VENTOLIN HFA) 108 (90 Base) MCG/ACT inhaler      aspirin 325 MG tablet 325 mg daily.     azithromycin (ZITHROMAX) 500 MG tablet Take 500 mg by mouth daily.     B-D INS SYR ULTRAFINE 1CC/31G 31G X 5/16" 1 ML MISC USE AS DIRECTED 100 each 0   baclofen (LIORESAL) 10 MG tablet TAKE 2 TABLETS BY MOUTH TWICE  DAILY 180 tablet 1   bisacodyl (DULCOLAX) 5 MG EC tablet Take 10 mg by mouth at bedtime.     Blood Glucose Monitoring Suppl (ONETOUCH VERIO) w/Device KIT      cholecalciferol (VITAMIN D3) 25 MCG (1000 UNIT) tablet Take 1,000 Units by mouth daily.     colchicine 0.6 MG tablet TAKE 2 TABLETS AS NEEDED GOUT ATTACK. THEN TAKE ANOTHER 1 TAB IN 1-2 HOURS. DONT REPEAT FOR 3 DAYS. (Patient taking differently: Take 0.6 mg by mouth daily as needed (gout).) 18 tablet 1   collagenase (SANTYL) 250 UNIT/GM ointment Apply 1 Application topically daily. 15 g 0   Continuous Blood Gluc Sensor (FREESTYLE LIBRE 2 SENSOR) MISC as directed subcutaneous every 14 days     docusate sodium (COLACE) 100 MG capsule Take 100 mg by mouth at bedtime.     ezetimibe (ZETIA) 10 MG tablet TAKE 1 TABLET BY MOUTH DAILY 100 tablet 2   FLUoxetine (PROZAC) 10 MG capsule Take 1 capsule (10 mg total) by mouth daily. 90 capsule 3   gabapentin (NEURONTIN) 800 MG tablet Take 1 tablet (800 mg total) by mouth 4 (four) times daily. Annual appt due in Dec w/labs must see provider for future refills 400 tablet 0   insulin NPH-regular Human (70-30) 100 UNIT/ML injection Inject 20 Units into the skin 2 (two) times daily with a meal. (Patient taking differently: Inject 30-40 Units into the skin See admin instructions. 30 units in the morning and afternoon and 40  units at night) 10 mL 11   lamoTRIgine (LAMICTAL) 25 MG tablet TAKE 1 TABLET BY MOUTH TWICE  DAILY 200 tablet 2   Lancet Devices (ACCU-CHEK SOFTCLIX) lancets Test two times daily 100 each 12   Lancets (ONETOUCH DELICA PLUS [id number]) MISC      levothyroxine (SYNTHROID) 75 MCG tablet Take 1 tablet (75 mcg total) by mouth daily before breakfast. Annual appt due in Dec w/labs must see provider for future refills 100 tablet 0   lisinopril (ZESTRIL) 10 MG tablet TAKE 1 TABLET BY MOUTH DAILY 100 tablet 2   loratadine (CLARITIN) 10 MG tablet Take 1 tablet (10 mg total) by mouth daily as needed for allergies. 100 tablet 3   ondansetron (ZOFRAN) 4 MG tablet Take 4 mg by mouth every 8 (eight) hours as needed for nausea or vomiting.     ONETOUCH VERIO test strip 1 each 3 (three) times daily.     rOPINIRole (REQUIP) 0.5 MG tablet Take 1 tablet (0.5 mg total) by mouth at bedtime. Annual appt due in Dec w/labs must see provider for future refills 100 tablet 0   silver sulfADIAZINE (SILVADENE) 1 % cream APPLY PEA-SIZED AMOUNT TO WOUND DAILY. 50 g 0   vitamin B-12 (CYANOCOBALAMIN) 1000 MCG tablet Take 1,000 mcg by mouth  daily.     zolpidem (AMBIEN) 10 MG tablet 10 mg at bedtime as needed for sleep.     oxyCODONE (ROXICODONE) 15 MG immediate release tablet Take 1 tablet (15 mg total) by mouth every 6 (six) hours as needed for pain. 120 tablet 0   oxyCODONE (ROXICODONE) 15 MG immediate release tablet Take 1 tablet (15 mg total) by mouth every 6 (six) hours as needed for pain. 120 tablet 0   clonazePAM (KLONOPIN) 0.5 MG tablet Take 1 tablet (0.5 mg total) by mouth 2 (two) times daily as needed for up to 5 days for anxiety. 60 tablet 1   No facility-administered medications prior to visit.    ROS: Review of Systems  Constitutional:  Negative for activity change, appetite change, chills, fatigue and unexpected weight change.  HENT:  Negative for congestion, mouth sores and sinus pressure.   Eyes:  Negative for  visual disturbance.  Respiratory:  Negative for cough and chest tightness.   Gastrointestinal:  Negative for abdominal pain and nausea.  Genitourinary:  Negative for difficulty urinating, frequency and vaginal pain.  Musculoskeletal:  Positive for arthralgias, back pain and gait problem.  Skin:  Positive for color change and wound. Negative for pallor and rash.  Neurological:  Negative for dizziness, tremors, weakness, numbness and headaches.  Psychiatric/Behavioral:  Negative for confusion, decreased concentration, sleep disturbance and suicidal ideas. The patient is nervous/anxious.     Objective:  BP 122/62 (BP Location: Left Arm, Patient Position: Sitting, Cuff Size: Large)   Pulse 84   Temp 98.8 F (37.1 C) (Oral)   Ht '5\' 9"'$  (1.753 m)   Wt 204 lb 12.8 oz (92.9 kg)   SpO2 95%   BMI 30.24 kg/m   BP Readings from Last 3 Encounters:  11/16/22 122/62  07/11/22 (!) 108/52  05/09/22 (!) 90/50    Wt Readings from Last 3 Encounters:  11/16/22 204 lb 12.8 oz (92.9 kg)  05/09/22 212 lb (96.2 kg)  04/13/22 216 lb 14.9 oz (98.4 kg)    Physical Exam Constitutional:      General: She is not in acute distress.    Appearance: She is well-developed. She is obese.  HENT:     Head: Normocephalic.     Right Ear: External ear normal.     Left Ear: External ear normal.     Nose: Nose normal.  Eyes:     General:        Right eye: No discharge.        Left eye: No discharge.     Conjunctiva/sclera: Conjunctivae normal.     Pupils: Pupils are equal, round, and reactive to light.  Neck:     Thyroid: No thyromegaly.     Vascular: No JVD.     Trachea: No tracheal deviation.  Cardiovascular:     Rate and Rhythm: Normal rate and regular rhythm.     Heart sounds: Normal heart sounds.  Pulmonary:     Effort: No respiratory distress.     Breath sounds: No stridor. No wheezing.  Abdominal:     General: Bowel sounds are normal. There is no distension.     Palpations: Abdomen is soft.  There is no mass.     Tenderness: There is no abdominal tenderness. There is no guarding or rebound.  Musculoskeletal:        General: No tenderness.     Cervical back: Normal range of motion and neck supple. No rigidity.  Lymphadenopathy:     Cervical:  No cervical adenopathy.  Skin:    Findings: No erythema or rash.  Neurological:     Cranial Nerves: No cranial nerve deficit.     Motor: No abnormal muscle tone.     Coordination: Coordination normal.     Gait: Gait abnormal.     Deep Tendon Reflexes: Reflexes normal.  Psychiatric:        Behavior: Behavior normal.        Thought Content: Thought content normal.        Judgment: Judgment normal.   R leg is in a surgical boot  Lab Results  Component Value Date   WBC 13.4 (H) 04/15/2022   HGB 14.0 04/15/2022   HCT 42.1 04/15/2022   PLT 303 04/15/2022   GLUCOSE 410 (H) 04/16/2022   CHOL 215 (H) 02/23/2022   TRIG 301.0 (H) 02/23/2022   HDL 34.70 (L) 02/23/2022   LDLDIRECT 135.0 02/23/2022   LDLCALC 81 01/02/2013   ALT 12 04/08/2022   AST 12 (L) 04/08/2022   NA 139 04/15/2022   K 4.5 04/15/2022   CL 105 04/15/2022   CREATININE 0.96 04/15/2022   BUN 17 04/15/2022   CO2 26 04/15/2022   TSH 0.99 09/09/2021   INR 1.0 07/19/2019   HGBA1C 7.5 (H) 04/10/2022   MICROALBUR 0.9 07/19/2016    DG Foot Complete Right  Result Date: 04/08/2022 CLINICAL DATA:  Right foot pain. EXAM: RIGHT FOOT COMPLETE - 3+ VIEW COMPARISON:  Right foot x-rays dated Mar 04, 2022. MRI right foot dated Mar 11, 2022. FINDINGS: Similar degree of chronic bony destruction involving the base of fifth metatarsal and cuboid. No progressive bony destruction. 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 No acute fracture or dislocation. Unchanged soft tissue wound along the lateral mid and forefoot. IMPRESSION: 1. Similar degree of chronic osteomyelitis involving the base of the fifth metatarsal and cuboid. No progressive bony destruction. Electronically  Signed   By: Titus Dubin M.D.   On: 04/08/2022 14:30    Assessment & Plan:   Problem List Items Addressed This Visit       Cardiovascular and Mediastinum   Essential hypertension - Primary    BP Readings from Last 3 Encounters:  11/16/22 122/62  07/11/22 (!) 108/52  05/09/22 (!) 90/50        Relevant Medications   aspirin 325 MG tablet     Endocrine   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)    CBG<120 On Insulin 70/30; Libre 3 for CGM      Relevant Medications   aspirin 325 MG tablet   Polyneuropathy due to type 2 diabetes mellitus (HCC) (Chronic)    CBGs are better on Libre 2      Relevant Medications   aspirin 325 MG tablet   zolpidem (AMBIEN) 10 MG tablet     Other   Chronic pain    On OXYcodone prn  Potential benefits of a long term opioids use as well as potential risks (i.e. addiction risk, apnea etc) and complications (i.e. Somnolence, constipation and others) were explained to the patient and were aknowledged.      Relevant Medications   aspirin 325 MG tablet   oxyCODONE (ROXICODONE) 15 MG immediate release tablet   oxyCODONE (ROXICODONE) 15 MG immediate release tablet      Meds ordered this encounter  Medications   oxyCODONE (ROXICODONE) 15 MG immediate release tablet    Sig: Take 1 tablet (15 mg total) by mouth every 6 (six) hours  as needed for pain.    Dispense:  120 tablet    Refill:  0    Please fill on or after 12/03/22   oxyCODONE (ROXICODONE) 15 MG immediate release tablet    Sig: Take 1 tablet (15 mg total) by mouth every 6 (six) hours as needed for pain.    Dispense:  120 tablet    Refill:  0    Please fill on or after 01/02/23      Follow-up: Return in about 3 months (around 02/15/2023) for a follow-up visit.  Walker Kehr, MD

## 2022-11-23 DIAGNOSIS — Z7982 Long term (current) use of aspirin: Secondary | ICD-10-CM | POA: Diagnosis not present

## 2022-11-23 DIAGNOSIS — J449 Chronic obstructive pulmonary disease, unspecified: Secondary | ICD-10-CM | POA: Diagnosis not present

## 2022-11-23 DIAGNOSIS — Z79891 Long term (current) use of opiate analgesic: Secondary | ICD-10-CM | POA: Diagnosis not present

## 2022-11-23 DIAGNOSIS — E559 Vitamin D deficiency, unspecified: Secondary | ICD-10-CM | POA: Diagnosis not present

## 2022-11-23 DIAGNOSIS — M545 Low back pain, unspecified: Secondary | ICD-10-CM | POA: Diagnosis not present

## 2022-11-23 DIAGNOSIS — F17210 Nicotine dependence, cigarettes, uncomplicated: Secondary | ICD-10-CM | POA: Diagnosis not present

## 2022-11-23 DIAGNOSIS — G894 Chronic pain syndrome: Secondary | ICD-10-CM | POA: Diagnosis not present

## 2022-11-23 DIAGNOSIS — M1990 Unspecified osteoarthritis, unspecified site: Secondary | ICD-10-CM | POA: Diagnosis not present

## 2022-11-23 DIAGNOSIS — E11621 Type 2 diabetes mellitus with foot ulcer: Secondary | ICD-10-CM | POA: Diagnosis not present

## 2022-11-23 DIAGNOSIS — L97412 Non-pressure chronic ulcer of right heel and midfoot with fat layer exposed: Secondary | ICD-10-CM | POA: Diagnosis not present

## 2022-11-23 DIAGNOSIS — G43909 Migraine, unspecified, not intractable, without status migrainosus: Secondary | ICD-10-CM | POA: Diagnosis not present

## 2022-11-23 DIAGNOSIS — E1142 Type 2 diabetes mellitus with diabetic polyneuropathy: Secondary | ICD-10-CM | POA: Diagnosis not present

## 2022-11-23 DIAGNOSIS — K5909 Other constipation: Secondary | ICD-10-CM | POA: Diagnosis not present

## 2022-11-23 DIAGNOSIS — E538 Deficiency of other specified B group vitamins: Secondary | ICD-10-CM | POA: Diagnosis not present

## 2022-11-23 DIAGNOSIS — E785 Hyperlipidemia, unspecified: Secondary | ICD-10-CM | POA: Diagnosis not present

## 2022-11-23 DIAGNOSIS — E039 Hypothyroidism, unspecified: Secondary | ICD-10-CM | POA: Diagnosis not present

## 2022-11-23 DIAGNOSIS — Z794 Long term (current) use of insulin: Secondary | ICD-10-CM | POA: Diagnosis not present

## 2022-11-23 DIAGNOSIS — I4510 Unspecified right bundle-branch block: Secondary | ICD-10-CM | POA: Diagnosis not present

## 2022-11-28 DIAGNOSIS — E039 Hypothyroidism, unspecified: Secondary | ICD-10-CM | POA: Diagnosis not present

## 2022-11-28 DIAGNOSIS — M86671 Other chronic osteomyelitis, right ankle and foot: Secondary | ICD-10-CM | POA: Diagnosis not present

## 2022-11-28 DIAGNOSIS — G894 Chronic pain syndrome: Secondary | ICD-10-CM | POA: Diagnosis not present

## 2022-11-28 DIAGNOSIS — M1990 Unspecified osteoarthritis, unspecified site: Secondary | ICD-10-CM | POA: Diagnosis not present

## 2022-11-28 DIAGNOSIS — F17210 Nicotine dependence, cigarettes, uncomplicated: Secondary | ICD-10-CM | POA: Diagnosis not present

## 2022-11-28 DIAGNOSIS — J449 Chronic obstructive pulmonary disease, unspecified: Secondary | ICD-10-CM | POA: Diagnosis not present

## 2022-11-28 DIAGNOSIS — M545 Low back pain, unspecified: Secondary | ICD-10-CM | POA: Diagnosis not present

## 2022-11-28 DIAGNOSIS — E785 Hyperlipidemia, unspecified: Secondary | ICD-10-CM | POA: Diagnosis not present

## 2022-11-28 DIAGNOSIS — E559 Vitamin D deficiency, unspecified: Secondary | ICD-10-CM | POA: Diagnosis not present

## 2022-11-28 DIAGNOSIS — Z7982 Long term (current) use of aspirin: Secondary | ICD-10-CM | POA: Diagnosis not present

## 2022-11-28 DIAGNOSIS — G43909 Migraine, unspecified, not intractable, without status migrainosus: Secondary | ICD-10-CM | POA: Diagnosis not present

## 2022-11-28 DIAGNOSIS — K5909 Other constipation: Secondary | ICD-10-CM | POA: Diagnosis not present

## 2022-11-28 DIAGNOSIS — E538 Deficiency of other specified B group vitamins: Secondary | ICD-10-CM | POA: Diagnosis not present

## 2022-11-28 DIAGNOSIS — Z794 Long term (current) use of insulin: Secondary | ICD-10-CM | POA: Diagnosis not present

## 2022-11-28 DIAGNOSIS — L97412 Non-pressure chronic ulcer of right heel and midfoot with fat layer exposed: Secondary | ICD-10-CM | POA: Diagnosis not present

## 2022-11-28 DIAGNOSIS — I4510 Unspecified right bundle-branch block: Secondary | ICD-10-CM | POA: Diagnosis not present

## 2022-11-28 DIAGNOSIS — Z79891 Long term (current) use of opiate analgesic: Secondary | ICD-10-CM | POA: Diagnosis not present

## 2022-11-28 DIAGNOSIS — E11621 Type 2 diabetes mellitus with foot ulcer: Secondary | ICD-10-CM | POA: Diagnosis not present

## 2022-11-28 DIAGNOSIS — E1142 Type 2 diabetes mellitus with diabetic polyneuropathy: Secondary | ICD-10-CM | POA: Diagnosis not present

## 2022-11-29 DIAGNOSIS — L02611 Cutaneous abscess of right foot: Secondary | ICD-10-CM | POA: Diagnosis not present

## 2022-11-29 DIAGNOSIS — E08621 Diabetes mellitus due to underlying condition with foot ulcer: Secondary | ICD-10-CM | POA: Diagnosis not present

## 2022-11-29 DIAGNOSIS — M86671 Other chronic osteomyelitis, right ankle and foot: Secondary | ICD-10-CM | POA: Diagnosis not present

## 2022-12-04 ENCOUNTER — Other Ambulatory Visit: Payer: Self-pay | Admitting: Internal Medicine

## 2022-12-05 DIAGNOSIS — L97412 Non-pressure chronic ulcer of right heel and midfoot with fat layer exposed: Secondary | ICD-10-CM | POA: Diagnosis not present

## 2022-12-05 DIAGNOSIS — J449 Chronic obstructive pulmonary disease, unspecified: Secondary | ICD-10-CM | POA: Diagnosis not present

## 2022-12-05 DIAGNOSIS — I4510 Unspecified right bundle-branch block: Secondary | ICD-10-CM | POA: Diagnosis not present

## 2022-12-05 DIAGNOSIS — Z794 Long term (current) use of insulin: Secondary | ICD-10-CM | POA: Diagnosis not present

## 2022-12-05 DIAGNOSIS — K5909 Other constipation: Secondary | ICD-10-CM | POA: Diagnosis not present

## 2022-12-05 DIAGNOSIS — M545 Low back pain, unspecified: Secondary | ICD-10-CM | POA: Diagnosis not present

## 2022-12-05 DIAGNOSIS — Z79891 Long term (current) use of opiate analgesic: Secondary | ICD-10-CM | POA: Diagnosis not present

## 2022-12-05 DIAGNOSIS — E559 Vitamin D deficiency, unspecified: Secondary | ICD-10-CM | POA: Diagnosis not present

## 2022-12-05 DIAGNOSIS — F17210 Nicotine dependence, cigarettes, uncomplicated: Secondary | ICD-10-CM | POA: Diagnosis not present

## 2022-12-05 DIAGNOSIS — M1990 Unspecified osteoarthritis, unspecified site: Secondary | ICD-10-CM | POA: Diagnosis not present

## 2022-12-05 DIAGNOSIS — G43909 Migraine, unspecified, not intractable, without status migrainosus: Secondary | ICD-10-CM | POA: Diagnosis not present

## 2022-12-05 DIAGNOSIS — E11621 Type 2 diabetes mellitus with foot ulcer: Secondary | ICD-10-CM | POA: Diagnosis not present

## 2022-12-05 DIAGNOSIS — E1142 Type 2 diabetes mellitus with diabetic polyneuropathy: Secondary | ICD-10-CM | POA: Diagnosis not present

## 2022-12-05 DIAGNOSIS — E785 Hyperlipidemia, unspecified: Secondary | ICD-10-CM | POA: Diagnosis not present

## 2022-12-05 DIAGNOSIS — Z7982 Long term (current) use of aspirin: Secondary | ICD-10-CM | POA: Diagnosis not present

## 2022-12-05 DIAGNOSIS — G894 Chronic pain syndrome: Secondary | ICD-10-CM | POA: Diagnosis not present

## 2022-12-05 DIAGNOSIS — E538 Deficiency of other specified B group vitamins: Secondary | ICD-10-CM | POA: Diagnosis not present

## 2022-12-05 DIAGNOSIS — E039 Hypothyroidism, unspecified: Secondary | ICD-10-CM | POA: Diagnosis not present

## 2022-12-09 DIAGNOSIS — E1165 Type 2 diabetes mellitus with hyperglycemia: Secondary | ICD-10-CM | POA: Diagnosis not present

## 2022-12-13 DIAGNOSIS — Z794 Long term (current) use of insulin: Secondary | ICD-10-CM | POA: Diagnosis not present

## 2022-12-13 DIAGNOSIS — E11621 Type 2 diabetes mellitus with foot ulcer: Secondary | ICD-10-CM | POA: Diagnosis not present

## 2022-12-13 DIAGNOSIS — E039 Hypothyroidism, unspecified: Secondary | ICD-10-CM | POA: Diagnosis not present

## 2022-12-13 DIAGNOSIS — E538 Deficiency of other specified B group vitamins: Secondary | ICD-10-CM | POA: Diagnosis not present

## 2022-12-13 DIAGNOSIS — E559 Vitamin D deficiency, unspecified: Secondary | ICD-10-CM | POA: Diagnosis not present

## 2022-12-13 DIAGNOSIS — I4510 Unspecified right bundle-branch block: Secondary | ICD-10-CM | POA: Diagnosis not present

## 2022-12-13 DIAGNOSIS — M1990 Unspecified osteoarthritis, unspecified site: Secondary | ICD-10-CM | POA: Diagnosis not present

## 2022-12-13 DIAGNOSIS — Z79891 Long term (current) use of opiate analgesic: Secondary | ICD-10-CM | POA: Diagnosis not present

## 2022-12-13 DIAGNOSIS — E1142 Type 2 diabetes mellitus with diabetic polyneuropathy: Secondary | ICD-10-CM | POA: Diagnosis not present

## 2022-12-13 DIAGNOSIS — G43909 Migraine, unspecified, not intractable, without status migrainosus: Secondary | ICD-10-CM | POA: Diagnosis not present

## 2022-12-13 DIAGNOSIS — M545 Low back pain, unspecified: Secondary | ICD-10-CM | POA: Diagnosis not present

## 2022-12-13 DIAGNOSIS — J449 Chronic obstructive pulmonary disease, unspecified: Secondary | ICD-10-CM | POA: Diagnosis not present

## 2022-12-13 DIAGNOSIS — K5909 Other constipation: Secondary | ICD-10-CM | POA: Diagnosis not present

## 2022-12-13 DIAGNOSIS — F17210 Nicotine dependence, cigarettes, uncomplicated: Secondary | ICD-10-CM | POA: Diagnosis not present

## 2022-12-13 DIAGNOSIS — Z7982 Long term (current) use of aspirin: Secondary | ICD-10-CM | POA: Diagnosis not present

## 2022-12-13 DIAGNOSIS — L97412 Non-pressure chronic ulcer of right heel and midfoot with fat layer exposed: Secondary | ICD-10-CM | POA: Diagnosis not present

## 2022-12-13 DIAGNOSIS — G894 Chronic pain syndrome: Secondary | ICD-10-CM | POA: Diagnosis not present

## 2022-12-13 DIAGNOSIS — E785 Hyperlipidemia, unspecified: Secondary | ICD-10-CM | POA: Diagnosis not present

## 2022-12-20 ENCOUNTER — Other Ambulatory Visit: Payer: Self-pay | Admitting: Internal Medicine

## 2022-12-21 ENCOUNTER — Other Ambulatory Visit: Payer: Self-pay | Admitting: Internal Medicine

## 2022-12-22 DIAGNOSIS — G43909 Migraine, unspecified, not intractable, without status migrainosus: Secondary | ICD-10-CM | POA: Diagnosis not present

## 2022-12-22 DIAGNOSIS — M545 Low back pain, unspecified: Secondary | ICD-10-CM | POA: Diagnosis not present

## 2022-12-22 DIAGNOSIS — E559 Vitamin D deficiency, unspecified: Secondary | ICD-10-CM | POA: Diagnosis not present

## 2022-12-22 DIAGNOSIS — E039 Hypothyroidism, unspecified: Secondary | ICD-10-CM | POA: Diagnosis not present

## 2022-12-22 DIAGNOSIS — E1142 Type 2 diabetes mellitus with diabetic polyneuropathy: Secondary | ICD-10-CM | POA: Diagnosis not present

## 2022-12-22 DIAGNOSIS — E11621 Type 2 diabetes mellitus with foot ulcer: Secondary | ICD-10-CM | POA: Diagnosis not present

## 2022-12-22 DIAGNOSIS — E538 Deficiency of other specified B group vitamins: Secondary | ICD-10-CM | POA: Diagnosis not present

## 2022-12-22 DIAGNOSIS — J449 Chronic obstructive pulmonary disease, unspecified: Secondary | ICD-10-CM | POA: Diagnosis not present

## 2022-12-22 DIAGNOSIS — M1990 Unspecified osteoarthritis, unspecified site: Secondary | ICD-10-CM | POA: Diagnosis not present

## 2022-12-22 DIAGNOSIS — K5909 Other constipation: Secondary | ICD-10-CM | POA: Diagnosis not present

## 2022-12-22 DIAGNOSIS — L97412 Non-pressure chronic ulcer of right heel and midfoot with fat layer exposed: Secondary | ICD-10-CM | POA: Diagnosis not present

## 2022-12-22 DIAGNOSIS — Z79891 Long term (current) use of opiate analgesic: Secondary | ICD-10-CM | POA: Diagnosis not present

## 2022-12-22 DIAGNOSIS — E785 Hyperlipidemia, unspecified: Secondary | ICD-10-CM | POA: Diagnosis not present

## 2022-12-22 DIAGNOSIS — I4510 Unspecified right bundle-branch block: Secondary | ICD-10-CM | POA: Diagnosis not present

## 2022-12-22 DIAGNOSIS — Z7982 Long term (current) use of aspirin: Secondary | ICD-10-CM | POA: Diagnosis not present

## 2022-12-22 DIAGNOSIS — Z794 Long term (current) use of insulin: Secondary | ICD-10-CM | POA: Diagnosis not present

## 2022-12-22 DIAGNOSIS — G894 Chronic pain syndrome: Secondary | ICD-10-CM | POA: Diagnosis not present

## 2022-12-22 DIAGNOSIS — F17210 Nicotine dependence, cigarettes, uncomplicated: Secondary | ICD-10-CM | POA: Diagnosis not present

## 2022-12-28 ENCOUNTER — Ambulatory Visit: Payer: 59 | Admitting: Podiatry

## 2022-12-28 ENCOUNTER — Other Ambulatory Visit: Payer: Self-pay | Admitting: Internal Medicine

## 2022-12-28 ENCOUNTER — Ambulatory Visit (INDEPENDENT_AMBULATORY_CARE_PROVIDER_SITE_OTHER): Payer: 59 | Admitting: Podiatry

## 2022-12-28 ENCOUNTER — Other Ambulatory Visit: Payer: Self-pay | Admitting: Podiatry

## 2022-12-28 DIAGNOSIS — M216X1 Other acquired deformities of right foot: Secondary | ICD-10-CM | POA: Diagnosis not present

## 2022-12-28 DIAGNOSIS — M24571 Contracture, right ankle: Secondary | ICD-10-CM | POA: Diagnosis not present

## 2022-12-28 DIAGNOSIS — M6780 Other specified disorders of synovium and tendon, unspecified site: Secondary | ICD-10-CM

## 2022-12-28 MED ORDER — DAKINS (1/2 STRENGTH) 0.25 % EX SOLN: 1.0000 | Freq: Every day | CUTANEOUS | 0 refills | Status: AC

## 2022-12-28 NOTE — Progress Notes (Signed)
Subjective:  Patient ID: Katherine Herrera, female    DOB: 1966/09/11,  MRN: [id number]  Chief Complaint  Patient presents with   Diabetic Ulcer    57 y.o. female presents with the above complaint.  Patient presents with follow-up of right fifth metatarsal base ulceration that appears to regress and is getting worse.  Her foot structure is becoming more cavovarus foot type likely due to weakening of the peroneal tendons.  Given the findings in the setting of the location of the ulceration patient will benefit from surgical intervention at this time given that if it continues to regress patient is a high risk of losing left foot.  She agrees with the plan like to undergo elective surgery to improve the structure of the foot if possible.8.  She is a diabetic with last A1c of7 .5   Review of Systems: Negative except as noted in the HPI. Denies N/V/F/Ch.  Past Medical History:  Diagnosis Date   Anxiety    Arthritis    hands   B12 deficiency    Chronic constipation    Chronic pain syndrome    neck, lower back, and foot   COPD (chronic obstructive pulmonary disease) ([place])    Diabetic ulcer of right foot ([place])    Gout    possible   History of seizure    05-26-2020  per pt as child and last one in late teen's   History of sepsis    due to lower extremity cellulits 2019 left , right 03/ 2021   History of syncope    Hyperlipidemia    Hypothyroidism    Impaired range of motion of cervical spine    per pt hx cervical fusion C4 -- 7,  has to be careful about turning her head to the right can cause her to pass out   Insulin dependent type 2 diabetes mellitus [facility])    endocrinologist--- dr Chalmers Cater    (05-26-2020 per pt check's blood sugar twice dialy and at times a third time,  fasting am blood sugar's---- 200 -- 250)   LBP (low back pain)    DR Patrice Paradise   Migraine    Peripheral neuropathy    RBBB (right bundle branch block)    Tenosynovitis of foot and ankle 09/03/2013   TTS (tarsal tunnel  syndrome) 2010   Left Foot [facility] in [place]   Vitamin D deficiency    Wears glasses     Current Outpatient Medications:    sodium hypochlorite (DAKIN'S 1/2 STRENGTH) external solution, Irrigate with 1 Application as directed daily., Disp: 437 mL, Rfl: 0   albuterol (VENTOLIN HFA) 108 (90 Base) MCG/ACT inhaler, , Disp: , Rfl:    aspirin 325 MG tablet, 325 mg daily., Disp: , Rfl:    azithromycin (ZITHROMAX) 500 MG tablet, Take 500 mg by mouth daily., Disp: , Rfl:    B-D INS SYR ULTRAFINE 1CC/31G 31G X 5/16" 1 ML MISC, USE AS DIRECTED, Disp: 100 each, Rfl: 0   baclofen (LIORESAL) 10 MG tablet, TAKE 2 TABLETS BY MOUTH TWICE  DAILY, Disp: 180 tablet, Rfl: 1   bisacodyl (DULCOLAX) 5 MG EC tablet, Take 10 mg by mouth at bedtime., Disp: , Rfl:    Blood Glucose Monitoring Suppl (ONETOUCH VERIO) w/Device KIT, , Disp: , Rfl:    cholecalciferol (VITAMIN D3) 25 MCG (1000 UNIT) tablet, Take 1,000 Units by mouth daily., Disp: , Rfl:    clonazePAM (KLONOPIN) 0.5 MG tablet, Take 1 tablet (0.5 mg total) by mouth 2 (  two) times daily as needed for up to 5 days for anxiety., Disp: 60 tablet, Rfl: 1   colchicine 0.6 MG tablet, TAKE 2 TABLETS AS NEEDED GOUT ATTACK. THEN TAKE ANOTHER 1 TAB IN 1-2 HOURS. DONT REPEAT FOR 3 DAYS., Disp: 18 tablet, Rfl: 1   collagenase (SANTYL) 250 UNIT/GM ointment, APPLY 1 APPLICATION TOPICALLY DAILY, Disp: 30 g, Rfl: 1   Continuous Blood Gluc Sensor (FREESTYLE LIBRE 2 SENSOR) MISC, as directed subcutaneous every 14 days, Disp: , Rfl:    docusate sodium (COLACE) 100 MG capsule, Take 100 mg by mouth at bedtime., Disp: , Rfl:    doxycycline (VIBRA-TABS) 100 MG tablet, TAKE 1 TABLET BY MOUTH TWICE A DAY, Disp: 60 tablet, Rfl: 0   ezetimibe (ZETIA) 10 MG tablet, TAKE 1 TABLET BY MOUTH DAILY, Disp: 100 tablet, Rfl: 2   FLUoxetine (PROZAC) 10 MG capsule, TAKE 1 CAPSULE BY MOUTH EVERY DAY, Disp: 90 capsule, Rfl: 2   gabapentin (NEURONTIN) 800 MG tablet, TAKE 1 TABLET BY MOUTH 4 TIMES  DAILY,  Disp: 400 tablet, Rfl: 2   insulin NPH-regular Human (70-30) 100 UNIT/ML injection, Inject 20 Units into the skin 2 (two) times daily with a meal. (Patient taking differently: Inject 30-40 Units into the skin See admin instructions. 30 units in the morning and afternoon and 40 units at night), Disp: 10 mL, Rfl: 11   lamoTRIgine (LAMICTAL) 25 MG tablet, TAKE 1 TABLET BY MOUTH TWICE  DAILY, Disp: 200 tablet, Rfl: 2   Lancet Devices (ACCU-CHEK SOFTCLIX) lancets, Test two times daily, Disp: 100 each, Rfl: 12   Lancets (ONETOUCH DELICA PLUS [id number]) MISC, , Disp: , Rfl:    levothyroxine (SYNTHROID) 75 MCG tablet, TAKE 1 TABLET BY MOUTH DAILY  BEFORE BREAKFAST, Disp: 100 tablet, Rfl: 2   lisinopril (ZESTRIL) 10 MG tablet, TAKE 1 TABLET BY MOUTH DAILY, Disp: 100 tablet, Rfl: 2   loratadine (CLARITIN) 10 MG tablet, Take 1 tablet (10 mg total) by mouth daily as needed for allergies., Disp: 100 tablet, Rfl: 3   ondansetron (ZOFRAN) 4 MG tablet, Take 4 mg by mouth every 8 (eight) hours as needed for nausea or vomiting., Disp: , Rfl:    ONETOUCH VERIO test strip, 1 each 3 (three) times daily., Disp: , Rfl:    oxyCODONE (ROXICODONE) 15 MG immediate release tablet, Take 1 tablet (15 mg total) by mouth every 6 (six) hours as needed for pain., Disp: 120 tablet, Rfl: 0   oxyCODONE (ROXICODONE) 15 MG immediate release tablet, Take 1 tablet (15 mg total) by mouth every 6 (six) hours as needed for pain., Disp: 120 tablet, Rfl: 0   rOPINIRole (REQUIP) 0.5 MG tablet, TAKE 1 TABLET BY MOUTH AT  BEDTIME, Disp: 100 tablet, Rfl: 2   silver sulfADIAZINE (SILVADENE) 1 % cream, APPLY PEA-SIZED AMOUNT TO WOUND DAILY., Disp: 50 g, Rfl: 0   vitamin B-12 (CYANOCOBALAMIN) 1000 MCG tablet, Take 1,000 mcg by mouth daily., Disp: , Rfl:    zolpidem (AMBIEN) 10 MG tablet, 10 mg at bedtime as needed for sleep., Disp: , Rfl:   Social History   Tobacco Use  Smoking Status Every Day   Packs/day: 1.00   Years: 10.00   Total pack  years: 10.00   Types: Cigarettes  Smokeless Tobacco Never  Tobacco Comments   Trying to quit, smoking 1 ppd    Allergies  Allergen Reactions   Cephalexin Nausea And Vomiting   Demerol  [Meperidine Hcl] Rash   Lovastatin Other (See Comments)  Possible myalgia    Metformin And Related Nausea And Vomiting   Sulfamethoxazole-Trimethoprim     Other reaction(s): Unknown DILI, pancreatitis   Crestor [Rosuvastatin Calcium]     myalgia   Hydromorphone     Other reaction(s): Unknown   Niacin Other (See Comments)    Unknown    Other Other (See Comments)   Hydromorphone Hcl Itching    Patient has been tolerating Hydromorphone tablets without adverse effect (07/09/19) Other reaction(s): Unknown   Paroxetine Other (See Comments)    Unknown    Objective:  There were no vitals filed for this visit. There is no height or weight on file to calculate BMI. Constitutional Well developed. Well nourished.  Vascular Dorsalis pedis pulses palpable bilaterally. Posterior tibial pulses palpable bilaterally. Capillary refill normal to all digits.  No cyanosis or clubbing noted. Pedal hair growth normal.  Neurologic Normal speech. Oriented to person, place, and time. Epicritic sensation to light touch grossly present bilaterally.  Dermatologic Nails well groomed and normal in appearance. No open wounds. No skin lesions.  Orthopedic: Right cavovarus foot structure noted with weakness of the peroneal brevis tendon.  Increased strength noted to the posterior tibial tendon as well as peroneus longus tendon.  Positive Silfverskiold test noted with gastroc soleus equinus.   Radiographs: None Assessment:   1. Tendon weakness   2. Equinus contracture of right ankle   3. Cavovarus deformity of foot, acquired, right    Plan:  Patient was evaluated and treated and all questions answered.  Right cavovarus foot structure semi-flexible -All questions and concerns were discussed with the patient  extensive detail -Given that she has failed all conservative care to heal the wound I believe she will benefit from realignment of the foot into a more rectus position via soft tissue tendon lengthening.  Ultimately she will benefit from peroneal longus to brevis transfer with posterior tibial tendon lengthening with Achilles tendon lengthening.  I discussed my surgical plan with the patient in extensive detail she states understanding would like to proceed with surgery.  I discussed my preoperative postoperative intraoperative plan in extensive detail. -Given that she is a diabetic she is a high risk of losing the foot if the ulcer continues to regress I discussed with the patient and she states understanding. -Informed surgical risk consent was reviewed and read aloud to the patient.  I reviewed the films.  I have discussed my findings with the patient in great detail.  I have discussed all risks including but not limited to infection, stiffness, scarring, limp, disability, deformity, damage to blood vessels and nerves, numbness, poor healing, need for braces, arthritis, chronic pain, amputation, death.  All benefits and realistic expectations discussed in great detail.  I have made no promises as to the outcome.  I have provided realistic expectations.  I have offered the patient a 2nd opinion, which they have declined and assured me they preferred to proceed despite the risks   No follow-ups on file.     Right peroneal tendon transfer and posterior tibial tendon lengthening needle with a Achilles tendon lengthening surgery

## 2022-12-29 DIAGNOSIS — E08621 Diabetes mellitus due to underlying condition with foot ulcer: Secondary | ICD-10-CM | POA: Diagnosis not present

## 2022-12-29 DIAGNOSIS — L02611 Cutaneous abscess of right foot: Secondary | ICD-10-CM | POA: Diagnosis not present

## 2022-12-29 DIAGNOSIS — M86671 Other chronic osteomyelitis, right ankle and foot: Secondary | ICD-10-CM | POA: Diagnosis not present

## 2022-12-30 DIAGNOSIS — K5909 Other constipation: Secondary | ICD-10-CM | POA: Diagnosis not present

## 2022-12-30 DIAGNOSIS — I4510 Unspecified right bundle-branch block: Secondary | ICD-10-CM | POA: Diagnosis not present

## 2022-12-30 DIAGNOSIS — F17210 Nicotine dependence, cigarettes, uncomplicated: Secondary | ICD-10-CM | POA: Diagnosis not present

## 2022-12-30 DIAGNOSIS — L97412 Non-pressure chronic ulcer of right heel and midfoot with fat layer exposed: Secondary | ICD-10-CM | POA: Diagnosis not present

## 2022-12-30 DIAGNOSIS — M545 Low back pain, unspecified: Secondary | ICD-10-CM | POA: Diagnosis not present

## 2022-12-30 DIAGNOSIS — E11621 Type 2 diabetes mellitus with foot ulcer: Secondary | ICD-10-CM | POA: Diagnosis not present

## 2022-12-30 DIAGNOSIS — E559 Vitamin D deficiency, unspecified: Secondary | ICD-10-CM | POA: Diagnosis not present

## 2022-12-30 DIAGNOSIS — G894 Chronic pain syndrome: Secondary | ICD-10-CM | POA: Diagnosis not present

## 2022-12-30 DIAGNOSIS — E1142 Type 2 diabetes mellitus with diabetic polyneuropathy: Secondary | ICD-10-CM | POA: Diagnosis not present

## 2022-12-30 DIAGNOSIS — E538 Deficiency of other specified B group vitamins: Secondary | ICD-10-CM | POA: Diagnosis not present

## 2022-12-30 DIAGNOSIS — Z794 Long term (current) use of insulin: Secondary | ICD-10-CM | POA: Diagnosis not present

## 2022-12-30 DIAGNOSIS — Z79891 Long term (current) use of opiate analgesic: Secondary | ICD-10-CM | POA: Diagnosis not present

## 2022-12-30 DIAGNOSIS — G43909 Migraine, unspecified, not intractable, without status migrainosus: Secondary | ICD-10-CM | POA: Diagnosis not present

## 2022-12-30 DIAGNOSIS — Z7982 Long term (current) use of aspirin: Secondary | ICD-10-CM | POA: Diagnosis not present

## 2022-12-30 DIAGNOSIS — E039 Hypothyroidism, unspecified: Secondary | ICD-10-CM | POA: Diagnosis not present

## 2022-12-30 DIAGNOSIS — J449 Chronic obstructive pulmonary disease, unspecified: Secondary | ICD-10-CM | POA: Diagnosis not present

## 2022-12-30 DIAGNOSIS — E785 Hyperlipidemia, unspecified: Secondary | ICD-10-CM | POA: Diagnosis not present

## 2022-12-30 DIAGNOSIS — M1990 Unspecified osteoarthritis, unspecified site: Secondary | ICD-10-CM | POA: Diagnosis not present

## 2023-01-02 ENCOUNTER — Ambulatory Visit (INDEPENDENT_AMBULATORY_CARE_PROVIDER_SITE_OTHER): Payer: 59

## 2023-01-02 VITALS — Ht 69.0 in | Wt 205.0 lb

## 2023-01-02 DIAGNOSIS — Z0000 Encounter for general adult medical examination without abnormal findings: Secondary | ICD-10-CM | POA: Diagnosis not present

## 2023-01-02 NOTE — Patient Instructions (Addendum)
Katherine Herrera , Thank you for taking time to come for your Medicare Wellness Visit. I appreciate your ongoing commitment to your health goals. Please review the following plan we discussed and let me know if I can assist you in the future.   These are the goals we discussed:  Goals      My goal for 2024 is to get my right foot well.        This is a list of the screening recommended for you and due dates:  Health Maintenance  Topic Date Due   Colon Cancer Screening  Never done   Mammogram  01/14/2017   Yearly kidney health urinalysis for diabetes  07/19/2017   Eye exam for diabetics  11/01/2017   Complete foot exam   11/13/2018   COVID-19 Vaccine (3 - Pfizer risk series) 10/26/2020   Hemoglobin A1C  10/10/2022   Yearly kidney function blood test for diabetes  04/16/2023   Medicare Annual Wellness Visit  01/02/2024   DTaP/Tdap/Td vaccine (4 - Td or Tdap) 01/27/2031   Flu Shot  Completed   Hepatitis C Screening: USPSTF Recommendation to screen - Ages 18-79 yo.  Completed   HIV Screening  Completed   Zoster (Shingles) Vaccine  Completed   HPV Vaccine  Aged Out    Advanced directives: No  Conditions/risks identified: Yes; Type II Diabetes  Next appointment: Follow up in one year for your annual wellness visit.   Preventive Care 40-64 Years, Female Preventive care refers to lifestyle choices and visits with your health care provider that can promote health and wellness. What does preventive care include? A yearly physical exam. This is also called an annual well check. Dental exams once or twice a year. Routine eye exams. Ask your health care provider how often you should have your eyes checked. Personal lifestyle choices, including: Daily care of your teeth and gums. Regular physical activity. Eating a healthy diet. Avoiding tobacco and drug use. Limiting alcohol use. Practicing safe sex. Taking low-dose aspirin daily starting at age 22. Taking vitamin and mineral  supplements as recommended by your health care provider. What happens during an annual well check? The services and screenings done by your health care provider during your annual well check will depend on your age, overall health, lifestyle risk factors, and family history of disease. Counseling  Your health care provider may ask you questions about your: Alcohol use. Tobacco use. Drug use. Emotional well-being. Home and relationship well-being. Sexual activity. Eating habits. Work and work Statistician. Method of birth control. Menstrual cycle. Pregnancy history. Screening  You may have the following tests or measurements: Height, weight, and BMI. Blood pressure. Lipid and cholesterol levels. These may be checked every 5 years, or more frequently if you are over 73 years old. Skin check. Lung cancer screening. You may have this screening every year starting at age 90 if you have a 30-pack-year history of smoking and currently smoke or have quit within the past 15 years. Fecal occult blood test (FOBT) of the stool. You may have this test every year starting at age 50. Flexible sigmoidoscopy or colonoscopy. You may have a sigmoidoscopy every 5 years or a colonoscopy every 10 years starting at age 47. Hepatitis C blood test. Hepatitis B blood test. Sexually transmitted disease (STD) testing. Diabetes screening. This is done by checking your blood sugar (glucose) after you have not eaten for a while (fasting). You may have this done every 1-3 years. Mammogram. This may be done every  1-2 years. Talk to your health care provider about when you should start having regular mammograms. This may depend on whether you have a family history of breast cancer. BRCA-related cancer screening. This may be done if you have a family history of breast, ovarian, tubal, or peritoneal cancers. Pelvic exam and Pap test. This may be done every 3 years starting at age 80. Starting at age 47, this may be done  every 5 years if you have a Pap test in combination with an HPV test. Bone density scan. This is done to screen for osteoporosis. You may have this scan if you are at high risk for osteoporosis. Discuss your test results, treatment options, and if necessary, the need for more tests with your health care provider. Vaccines  Your health care provider may recommend certain vaccines, such as: Influenza vaccine. This is recommended every year. Tetanus, diphtheria, and acellular pertussis (Tdap, Td) vaccine. You may need a Td booster every 10 years. Zoster vaccine. You may need this after age 50. Pneumococcal 13-valent conjugate (PCV13) vaccine. You may need this if you have certain conditions and were not previously vaccinated. Pneumococcal polysaccharide (PPSV23) vaccine. You may need one or two doses if you smoke cigarettes or if you have certain conditions. Talk to your health care provider about which screenings and vaccines you need and how often you need them. This information is not intended to replace advice given to you by your health care provider. Make sure you discuss any questions you have with your health care provider. Document Released: 11/13/2015 Document Revised: 07/06/2016 Document Reviewed: 08/18/2015 Elsevier Interactive Patient Education  2017 [place] Prevention in the Home Falls can cause injuries. They can happen to people of all ages. There are many things you can do to make your home safe and to help prevent falls. What can I do on the outside of my home? Regularly fix the edges of walkways and driveways and fix any cracks. Remove anything that might make you trip as you walk through a door, such as a raised step or threshold. Trim any bushes or trees on the path to your home. Use bright outdoor lighting. Clear any walking paths of anything that might make someone trip, such as rocks or tools. Regularly check to see if handrails are loose or broken. Make  sure that both sides of any steps have handrails. Any raised decks and porches should have guardrails on the edges. Have any leaves, snow, or ice cleared regularly. Use sand or salt on walking paths during winter. Clean up any spills in your garage right away. This includes oil or grease spills. What can I do in the bathroom? Use night lights. Install grab bars by the toilet and in the tub and shower. Do not use towel bars as grab bars. Use non-skid mats or decals in the tub or shower. If you need to sit down in the shower, use a plastic, non-slip stool. Keep the floor dry. Clean up any water that spills on the floor as soon as it happens. Remove soap buildup in the tub or shower regularly. Attach bath mats securely with double-sided non-slip rug tape. Do not have throw rugs and other things on the floor that can make you trip. What can I do in the bedroom? Use night lights. Make sure that you have a light by your bed that is easy to reach. Do not use any sheets or blankets that are too big for your bed.  They should not hang down onto the floor. Have a firm chair that has side arms. You can use this for support while you get dressed. Do not have throw rugs and other things on the floor that can make you trip. What can I do in the kitchen? Clean up any spills right away. Avoid walking on wet floors. Keep items that you use a lot in easy-to-reach places. If you need to reach something above you, use a strong step stool that has a grab bar. Keep electrical cords out of the way. Do not use floor polish or wax that makes floors slippery. If you must use wax, use non-skid floor wax. Do not have throw rugs and other things on the floor that can make you trip. What can I do with my stairs? Do not leave any items on the stairs. Make sure that there are handrails on both sides of the stairs and use them. Fix handrails that are broken or loose. Make sure that handrails are as long as the  stairways. Check any carpeting to make sure that it is firmly attached to the stairs. Fix any carpet that is loose or worn. Avoid having throw rugs at the top or bottom of the stairs. If you do have throw rugs, attach them to the floor with carpet tape. Make sure that you have a light switch at the top of the stairs and the bottom of the stairs. If you do not have them, ask someone to add them for you. What else can I do to help prevent falls? Wear shoes that: Do not have high heels. Have rubber bottoms. Are comfortable and fit you well. Are closed at the toe. Do not wear sandals. If you use a stepladder: Make sure that it is fully opened. Do not climb a closed stepladder. Make sure that both sides of the stepladder are locked into place. Ask someone to hold it for you, if possible. Clearly mark and make sure that you can see: Any grab bars or handrails. First and last steps. Where the edge of each step is. Use tools that help you move around (mobility aids) if they are needed. These include: Canes. Walkers. Scooters. Crutches. Turn on the lights when you go into a dark area. Replace any light bulbs as soon as they burn out. Set up your furniture so you have a clear path. Avoid moving your furniture around. If any of your floors are uneven, fix them. If there are any pets around you, be aware of where they are. Review your medicines with your doctor. Some medicines can make you feel dizzy. This can increase your chance of falling. Ask your doctor what other things that you can do to help prevent falls. This information is not intended to replace advice given to you by your health care provider. Make sure you discuss any questions you have with your health care provider. Document Released: 08/13/2009 Document Revised: 03/24/2016 Document Reviewed: 11/21/2014 Elsevier Interactive Patient Education  2017 [employer].

## 2023-01-02 NOTE — Progress Notes (Signed)
I connected with  Katherine Herrera on 01/01/2022 at 1:00 p.m. EST by telephone and verified that I am speaking with the correct person using two identifiers.  Location: Patient: Home Provider: [place] Persons participating in the virtual visit: [place]   I discussed the limitations, risks, security and privacy concerns of performing an evaluation and management service by telephone and the availability of in person appointments. The patient expressed understanding and agreed to proceed.  Interactive audio and video telecommunications were attempted between this nurse and patient, however failed, due to patient having technical difficulties OR patient did not have access to video capability.  We continued and completed visit with audio only.  Some vital signs may be absent or patient reported.   Sheral Flow, LPN  Subjective:   Katherine Herrera is a 57 y.o. female who presents for Medicare Annual (Subsequent) preventive examination.  Review of Systems     Cardiac Risk Factors include: diabetes mellitus;dyslipidemia;family history of premature cardiovascular disease;hypertension;obesity (BMI >30kg/m2);sedentary lifestyle;smoking/ tobacco exposure     Objective:    Today's Vitals   01/02/23 1302  Weight: 205 lb (93 kg)  Height: '5\' 9"'$  (1.753 m)  PainSc: 0-No pain   Body mass index is 30.27 kg/m.     01/02/2023    1:05 PM 04/13/2022   11:14 AM 04/10/2022    8:36 AM 12/30/2021    1:23 PM 11/27/2021    3:49 PM 11/27/2021   10:23 AM 05/27/2021    3:30 PM  Advanced Directives  Does Patient Have a Medical Advance Directive? No No No No No No No  Would patient like information on creating a medical advance directive? No - Patient declined No - Patient declined No - Patient declined No - Patient declined No - Patient declined No - Patient declined No - Patient declined    Current Medications (verified) Outpatient Encounter Medications as of 01/02/2023   Medication Sig   albuterol (VENTOLIN HFA) 108 (90 Base) MCG/ACT inhaler    aspirin 325 MG tablet 325 mg daily.   azithromycin (ZITHROMAX) 500 MG tablet Take 500 mg by mouth daily.   B-D INS SYR ULTRAFINE 1CC/31G 31G X 5/16" 1 ML MISC USE AS DIRECTED   baclofen (LIORESAL) 10 MG tablet TAKE 2 TABLETS BY MOUTH TWICE  DAILY   bisacodyl (DULCOLAX) 5 MG EC tablet Take 10 mg by mouth at bedtime.   Blood Glucose Monitoring Suppl (ONETOUCH VERIO) w/Device KIT    cholecalciferol (VITAMIN D3) 25 MCG (1000 UNIT) tablet Take 1,000 Units by mouth daily.   clonazePAM (KLONOPIN) 0.5 MG tablet Take 1 tablet (0.5 mg total) by mouth 2 (two) times daily as needed for up to 5 days for anxiety.   colchicine 0.6 MG tablet TAKE 2 TABLETS AS NEEDED GOUT ATTACK. THEN TAKE ANOTHER 1 TAB IN 1-2 HOURS. DONT REPEAT FOR 3 DAYS.   collagenase (SANTYL) 250 UNIT/GM ointment APPLY 1 APPLICATION TOPICALLY DAILY   Continuous Blood Gluc Sensor (FREESTYLE LIBRE 2 SENSOR) MISC as directed subcutaneous every 14 days   docusate sodium (COLACE) 100 MG capsule Take 100 mg by mouth at bedtime.   doxycycline (VIBRA-TABS) 100 MG tablet TAKE 1 TABLET BY MOUTH TWICE A DAY   ezetimibe (ZETIA) 10 MG tablet TAKE 1 TABLET BY MOUTH DAILY   FLUoxetine (PROZAC) 10 MG capsule TAKE 1 CAPSULE BY MOUTH EVERY DAY   gabapentin (NEURONTIN) 800 MG tablet TAKE 1 TABLET BY MOUTH 4 TIMES  DAILY   insulin NPH-regular Human (70-30)  100 UNIT/ML injection Inject 20 Units into the skin 2 (two) times daily with a meal. (Patient taking differently: Inject 30-40 Units into the skin See admin instructions. 30 units in the morning and afternoon and 40 units at night)   lamoTRIgine (LAMICTAL) 25 MG tablet TAKE 1 TABLET BY MOUTH TWICE  DAILY   Lancet Devices (ACCU-CHEK SOFTCLIX) lancets Test two times daily   Lancets (ONETOUCH DELICA PLUS 123XX123) MISC    levothyroxine (SYNTHROID) 75 MCG tablet TAKE 1 TABLET BY MOUTH DAILY  BEFORE BREAKFAST   lisinopril (ZESTRIL) 10  MG tablet TAKE 1 TABLET BY MOUTH DAILY   loratadine (CLARITIN) 10 MG tablet Take 1 tablet (10 mg total) by mouth daily as needed for allergies.   ondansetron (ZOFRAN) 4 MG tablet Take 4 mg by mouth every 8 (eight) hours as needed for nausea or vomiting.   ONETOUCH VERIO test strip 1 each 3 (three) times daily.   oxyCODONE (ROXICODONE) 15 MG immediate release tablet Take 1 tablet (15 mg total) by mouth every 6 (six) hours as needed for pain.   oxyCODONE (ROXICODONE) 15 MG immediate release tablet Take 1 tablet (15 mg total) by mouth every 6 (six) hours as needed for pain.   rOPINIRole (REQUIP) 0.5 MG tablet TAKE 1 TABLET BY MOUTH AT  BEDTIME   silver sulfADIAZINE (SILVADENE) 1 % cream APPLY PEA-SIZED AMOUNT TO WOUND DAILY.   sodium hypochlorite (DAKIN'S 1/2 STRENGTH) external solution Irrigate with 1 Application as directed daily.   vitamin B-12 (CYANOCOBALAMIN) 1000 MCG tablet Take 1,000 mcg by mouth daily.   zolpidem (AMBIEN) 10 MG tablet 10 mg at bedtime as needed for sleep.   No facility-administered encounter medications on file as of 01/02/2023.    Allergies (verified) Cephalexin, Demerol  [meperidine hcl], Lovastatin, Metformin and related, Sulfamethoxazole-trimethoprim, Crestor [rosuvastatin calcium], Hydromorphone, Niacin, Other, Hydromorphone hcl, and Paroxetine   History: Past Medical History:  Diagnosis Date   Anxiety    Arthritis    hands   B12 deficiency    Chronic constipation    Chronic pain syndrome    neck, lower back, and foot   COPD (chronic obstructive pulmonary disease) ([place])    Diabetic ulcer of right foot ([place])    Gout    possible   History of seizure    05-26-2020  per pt as child and last one in late teen's   History of sepsis    due to lower extremity cellulits 2019 left , right 03/ 2021   History of syncope    Hyperlipidemia    Hypothyroidism    Impaired range of motion of cervical spine    per pt hx cervical fusion C4 -- 7,  has to be careful about  turning her head to the right can cause her to pass out   Insulin dependent type 2 diabetes mellitus [facility])    endocrinologist--- dr Chalmers Cater    (05-26-2020 per pt check's blood sugar twice dialy and at times a third time,  fasting am blood sugar's---- 200 -- 250)   LBP (low back pain)    DR Patrice Paradise   Migraine    Peripheral neuropathy    RBBB (right bundle branch block)    Tenosynovitis of foot and ankle 09/03/2013   TTS (tarsal tunnel syndrome) 2010   Left Foot [place] in [place]   Vitamin D deficiency    Wears glasses    Past Surgical History:  Procedure Laterality Date   ANTERIOR CERVICAL DECOMP/DISCECTOMY FUSION  02-12-2004;  07-06-2006  C4--5 in 2005;  C5 --7  in  [id number]   [place] Right 07/05/2019   Procedure: Application Of Wound Vac;  Surgeon: Evelina Bucy, DPM;  Location: [place];  Service: Podiatry;  Laterality: Right;   APPLICATION OF WOUND VAC Right 04/10/2022   Procedure: APPLICATION OF WOUND VAC, PLACEMENT OF ANTIBIOTIC BEADS;  Surgeon: [name], DPM;  Location: [place];  Service: Podiatry;  Laterality: Right;   BONE BIOPSY Right 05/29/2020   Procedure: BONE BIOPSY;  Surgeon: Evelina Bucy, DPM;  Location: [place];  Service: Podiatry;  Laterality: Right;   CARDIAC CATHETERIZATION  05-27-2002   dr Einar Gip   for positive cardiolite;  normal coronaries and LVF, ef 70%   CARPAL TUNNEL RELEASE Bilateral left 12-25-2008;  right 09-22-2009  both [facility]    HARDWARE REMOVAL  01-07-2016  '@[facility]'$    right foot   INCISION AND DRAINAGE OF WOUND Right 07/20/2019   Procedure: IRRIGATION AND DEBRIDEMENT foot;  Surgeon: Evelina Bucy, DPM;  Location: [facility];  Service: Podiatry;  Laterality: Right;   INCISION AND DRAINAGE OF WOUND Right 07/22/2019   Procedure: IRRIGATION AND DEBRIDEMENT RIGHT FOOT; APPLICATION OF WOUND VAC;  Surgeon: Evelina Bucy, DPM;  Location: [facility];  Service: Podiatry;  Laterality: Right;   INCISION AND DRAINAGE OF WOUND Right 07/24/2019    Procedure: IRRIGATION AND DEBRIDEMENT WOUND, APPLICATION OF WOUND VAC;  Surgeon: Evelina Bucy, DPM;  Location: [facility];  Service: Podiatry;  Laterality: Right;   INCISION AND DRAINAGE OF WOUND Right 05/29/2020   Procedure: RIGHT FOOT WOUND DEBRIDEMENT AND IRRIGATION;  Surgeon: Evelina Bucy, DPM;  Location: [place];  Service: Podiatry;  Laterality: Right;   INCISION AND DRAINAGE OF WOUND Right 04/13/2022   Procedure: IRRIGATION AND DEBRIDEMENT WOUND;  Surgeon: Felipa Furnace, DPM;  Location: [place];  Service: Podiatry;  Laterality: Right;   IRRIGATION AND DEBRIDEMENT FOOT Right 04/10/2022   Procedure: IRRIGATION AND DEBRIDEMENT FOOT;  Surgeon: [name], DPM;  Location: [place];  Service: Podiatry;  Laterality: Right;   LUMBAR DISC SURGERY  07-07-2005   '@[facility]    L5 - S1   METATARSAL HEAD EXCISION Bilateral 01/02/2019   Procedure: Left foot 5th metatarsal resection, wound excision, adjacent tissue transfer. Right fifth metatarsal head resection, fitfth metatarsal base resection, intermediate wound repair.;  Surgeon: Evelina Bucy, DPM;  Location: [place];  Service: Podiatry;  Laterality: Bilateral;   METATARSAL HEAD EXCISION Right 05/12/2021   Procedure: Right 5th met base resection;  Surgeon: Evelina Bucy, DPM;  [facility];  Service: Podiatry;  Laterality: Right;   METATARSAL OSTEOTOMY Right 07/05/2019   Procedure: RIGHT FOOT 5TH METATARSAL RESECTION; PERONEAL TENDONESIS, EXCISION OF ULCER, LOCAL TISSUE TRANSFER ;  Surgeon: Evelina Bucy, DPM;  Location: [place];  Service: Podiatry;  Laterality: Right;   ORIF METATARSAL FRACTURE  09-22-2015  '@[facility]    right 5th   TOTAL ABDOMINAL HYSTERECTOMY W/ BILATERAL SALPINGOOPHORECTOMY  2000 approx.   Family History  Problem Relation Age of Onset   Diabetes Mother    Hypertension Mother    Cancer Father 35       ? bone cancer   [name] Brother    Social History   Socioeconomic History   Marital  status: Legally Separated    Spouse name: Not on file   Number of children: Not on file   Years of education: Not on file   Highest education  level: Not on file  Occupational History   Not on file  Tobacco Use   Smoking status: Every Day    Packs/day: 1.00    Years: 10.00    Total pack years: 10.00    Types: Cigarettes   Smokeless tobacco: Never   Tobacco comments:    Trying to quit, smoking 1 ppd  Vaping Use   Vaping Use: Never used  Substance and Sexual Activity   Alcohol use: No    Alcohol/week: 0.0 standard drinks of alcohol   Drug use: No   Sexual activity: Never  Other Topics Concern   Not on file  Social History Narrative   GYN - Dr Radene Knee      FAMILY HISTORY   Lung cancer   Mother w/alcoholism      Married 3d GS due Nov   Diet Coke: 10 cans a day      Regular exercise - NO      Former smoker 2009 restared 2010 stopped 12/2009   Social Determinants of Health   Financial Resource Strain: Low Risk  (01/02/2023)   Overall Financial Resource Strain (CARDIA)    Difficulty of Paying Living Expenses: Not hard at all  Food Insecurity: No Food Insecurity (01/02/2023)   Hunger Vital Sign    Worried About Running Out of Food in the Last Year: Never true    [place] in the Last Year: Never true  Transportation Needs: No Transportation Needs (01/02/2023)   PRAPARE - Hydrologist (Medical): No    Lack of Transportation (Non-Medical): No  Physical Activity: Inactive (01/02/2023)   Exercise Vital Sign    Days of Exercise per Week: 0 days    Minutes of Exercise per Session: 0 min  Stress: No Stress Concern Present (01/02/2023)   [place]    Feeling of Stress : Not at all  Social Connections: Moderately Isolated (01/02/2023)   Social Connection and Isolation Panel [NHANES]    Frequency of Communication with Friends and Family: More than three times a week    Frequency of Social  Gatherings with Friends and Family: Once a week    Attends Religious Services: Never    Marine scientist or Organizations: No    Attends Music therapist: Never    Marital Status: Married    Tobacco Counseling Ready to quit: Not Answered Counseling given: Not Answered Tobacco comments: Trying to quit, smoking 1 ppd   Clinical Intake:  Pre-visit preparation completed: Yes  Pain : No/denies pain Pain Score: 0-No pain     BMI - recorded: 30.27 Nutritional Status: BMI > 30  Obese Nutritional Risks: None Diabetes: No  How often do you need to have someone help you when you read instructions, pamphlets, or other written materials from your doctor or pharmacy?: 1 - Never What is the last grade level you completed in school?: HSG  Nutrition Risk Assessment:  Has the patient had any N/V/D within the last 2 months?  No  Does the patient have any non-healing wounds?  No  Has the patient had any unintentional weight loss or weight gain?  No   Diabetes:  Is the patient diabetic?  Yes  If diabetic, was a CBG obtained today?  No  Did the patient bring in their glucometer from home?  No  How often do you monitor your CBG's? Freestyle Libre 2-3 times daily.   Financial Strains and  Diabetes Management:  Are you having any financial strains with the device, your supplies or your medication? No .  Does the patient want to be seen by Chronic Care Management for management of their diabetes?  No  Would the patient like to be referred to a Nutritionist or for Diabetic Management?  No   Diabetic Exams:  Diabetic Eye Exam: Overdue for diabetic eye exam. Pt has been advised about the importance in completing this exam. Patient advised to call and schedule an eye exam. Diabetic Foot Exam: Overdue, Pt has been advised about the importance in completing this exam. Pt is scheduled for diabetic foot exam on 02/15/2023.   Interpreter Needed?: No  Information entered by ::  Lisette Abu, LPN.   Activities of Daily Living    01/02/2023    1:16 PM 04/13/2022   11:43 PM  In your present state of health, do you have any difficulty performing the following activities:  Hearing? 0   Vision? 0   Difficulty concentrating or making decisions? 0   Walking or climbing stairs? 1   Comment right foot pain   Dressing or bathing? 0   Doing errands, shopping? 0 0  Preparing Food and eating ? N   Using the Toilet? N   In the past six months, have you accidently leaked urine? N   Do you have problems with loss of bowel control? N   Managing your Medications? N   Managing your Finances? N   Housekeeping or managing your Housekeeping? N     Patient Care Team: Plotnikov, [name], MD as PCP - General Arvella Nigh, MD as Consulting Physician (Obstetrics and Gynecology) Jacelyn Pi, MD as Referring Physician (Endocrinology) Felipa Furnace, DPM as Consulting Physician (Podiatry)  Indicate any recent Medical Services you may have received from other than [facility] providers in the past year (date may be approximate).     Assessment:   This is a routine wellness examination for [place].  Hearing/Vision screen Hearing Screening - Comments:: Denies hearing difficulties.  Vision Screening - Comments:: Wears rx glasses - up to date with routine eye exams with  [facility]  Dietary issues and exercise activities discussed: Current Exercise Habits: The patient does not participate in regular exercise at present, Exercise limited by: orthopedic condition(s);respiratory conditions(s)   Goals Addressed             This Visit's Progress    My goal for 2024 is to get my right foot well.        Depression Screen    01/02/2023    1:05 PM 11/16/2022    3:28 PM 02/23/2022    2:58 PM 12/30/2021    1:12 PM 10/28/2021   11:30 AM 05/27/2021    3:30 PM 12/22/2020   12:02 PM  PHQ 2/9 Scores  PHQ - 2 Score 0 0 2 2 0 1 0  PHQ- 9 Score   7        Fall Risk    01/02/2023     1:05 PM 11/16/2022    3:28 PM 05/04/2022   10:30 AM 03/02/2022    9:45 AM 02/23/2022    2:59 PM  Fall Risk   Falls in the past year? 0 0 1  1  Comment   reports multiple falls x last 12 months; using cane/ walker/  wheelchair Denies new/ recent falls since last outreach 01/21/22; currently not using assistive devices; wears boot for ongoing non-healing (R) foot wound   Number falls in  past yr: 0 0 1  0  Injury with Fall? 0 0 0  0  Risk for fall due to : No Fall Risks No Fall Risks History of fall(s);Impaired mobility;Medication side effect;Orthopedic patient Orthopedic patient;History of fall(s);Medication side effect History of fall(s)  Follow up Falls prevention discussed Falls evaluation completed Falls prevention discussed Falls prevention discussed Falls evaluation completed    FALL RISK PREVENTION PERTAINING TO THE HOME:  Any stairs in or around the home? No  If so, are there any without handrails? No  Home free of loose throw rugs in walkways, pet beds, electrical cords, etc? Yes  Adequate lighting in your home to reduce risk of falls? Yes   ASSISTIVE DEVICES UTILIZED TO PREVENT FALLS:  Life alert? No  Use of a cane, walker or w/c? No  Grab bars in the bathroom? Yes  Shower chair or bench in shower? Yes  Elevated toilet seat or a handicapped toilet? No   TIMED UP AND GO:  Was the test performed? No . Telephonic Visit  Cognitive Function:        01/02/2023    1:07 PM  6CIT Screen  What Year? 0 points  What month? 0 points  What time? 0 points  Count back from 20 0 points  Months in reverse 0 points  Repeat phrase 0 points  Total Score 0 points    Immunizations Immunization History  Administered Date(s) Administered   DTaP 06/15/2011   Influenza, Quadrivalent, Recombinant, Inj, Pf 07/31/2018   Influenza,inj,Quad PF,6+ Mos 08/12/2015, 07/19/2016, 07/01/2019, 10/21/2020, 08/11/2021, 07/11/2022   PFIZER(Purple Top)SARS-COV-2 Vaccination 07/15/2020, 09/28/2020    Pneumococcal Conjugate-13 02/17/2017   Pneumococcal Polysaccharide-23 01/26/2021   Td 10/31/2008   Tdap 01/26/2021   Zoster Recombinat (Shingrix) 11/05/2021, 03/07/2022    TDAP status: Up to date  Flu Vaccine status: Up to date  Pneumococcal vaccine status: Up to date  Covid-19 vaccine status: Completed vaccines  Qualifies for Shingles Vaccine? Yes   Zostavax completed No   Shingrix Completed?: Yes  Screening Tests Health Maintenance  Topic Date Due   COLONOSCOPY (Pts 45-38yr Insurance coverage will need to be confirmed)  Never done   MAMMOGRAM  01/14/2017   Diabetic kidney evaluation - Urine ACR  07/19/2017   OPHTHALMOLOGY EXAM  11/01/2017   FOOT EXAM  11/13/2018   COVID-19 Vaccine (3 - Pfizer risk series) 10/26/2020   HEMOGLOBIN A1C  10/10/2022   Diabetic kidney evaluation - eGFR measurement  04/16/2023   Medicare Annual Wellness (AWV)  01/02/2024   DTaP/Tdap/Td (4 - Td or Tdap) 01/27/2031   INFLUENZA VACCINE  Completed   Hepatitis C Screening  Completed   HIV Screening  Completed   Zoster Vaccines- Shingrix  Completed   HPV VACCINES  Aged Out    Health Maintenance  Health Maintenance Due  Topic Date Due   COLONOSCOPY (Pts 45-484yrInsurance coverage will need to be confirmed)  Never done   MAMMOGRAM  01/14/2017   Diabetic kidney evaluation - Urine ACR  07/19/2017   OPHTHALMOLOGY EXAM  11/01/2017   FOOT EXAM  11/13/2018   COVID-19 Vaccine (3 - Pfizer risk series) 10/26/2020   HEMOGLOBIN A1C  10/10/2022    Colorectal cancer screening: Type of screening: Colonoscopy. Completed 11/18/2010. Repeat every 10 years  Mammogram status: Completed or scheduled for 01/2023 with OB/GYN per patient. Repeat every year  Bone Density status: Scheduled for 01/2023 with OB/GYN per patient.  Lung Cancer Screening: (Low Dose CT Chest recommended if Age 57-80ears, 30 pack-year  currently smoking OR have quit w/in 15years.) does not qualify.   Lung Cancer Screening Referral:  no  Additional Screening:  Hepatitis C Screening: does qualify; Completed 07/19/2016  Vision Screening: Recommended annual ophthalmology exams for early detection of glaucoma and other disorders of the eye. Is the patient up to date with their annual eye exam?  No  Who is the provider or what is the name of the office in which the patient attends annual eye exams? [facility] If pt is not established with a provider, would they like to be referred to a provider to establish care? No .   Dental Screening: Recommended annual dental exams for proper oral hygiene  Community Resource Referral / Chronic Care Management: CRR required this visit?  No   CCM required this visit?  No      Plan:     I have personally reviewed and noted the following in the patient's chart:   Medical and social history Use of alcohol, tobacco or illicit drugs  Current medications and supplements including opioid prescriptions. Patient is currently taking opioid prescriptions. Information provided to patient regarding non-opioid alternatives. Patient advised to discuss non-opioid treatment plan with their provider. Functional ability and status Nutritional status Physical activity Advanced directives List of other physicians Hospitalizations, surgeries, and ER visits in previous 12 months Vitals Screenings to include cognitive, depression, and falls Referrals and appointments  In addition, I have reviewed and discussed with patient certain preventive protocols, quality metrics, and best practice recommendations. A written personalized care plan for preventive services as well as general preventive health recommendations were provided to patient.     Sheral Flow, LPN   [id number]   Nurse Notes:  Normal cognitive status assessed by direct observation by this Nurse Health Advisor. No abnormalities found.

## 2023-01-06 DIAGNOSIS — G894 Chronic pain syndrome: Secondary | ICD-10-CM | POA: Diagnosis not present

## 2023-01-06 DIAGNOSIS — E559 Vitamin D deficiency, unspecified: Secondary | ICD-10-CM | POA: Diagnosis not present

## 2023-01-06 DIAGNOSIS — K5909 Other constipation: Secondary | ICD-10-CM | POA: Diagnosis not present

## 2023-01-06 DIAGNOSIS — E1142 Type 2 diabetes mellitus with diabetic polyneuropathy: Secondary | ICD-10-CM | POA: Diagnosis not present

## 2023-01-06 DIAGNOSIS — F17210 Nicotine dependence, cigarettes, uncomplicated: Secondary | ICD-10-CM | POA: Diagnosis not present

## 2023-01-06 DIAGNOSIS — L97412 Non-pressure chronic ulcer of right heel and midfoot with fat layer exposed: Secondary | ICD-10-CM | POA: Diagnosis not present

## 2023-01-06 DIAGNOSIS — Z79891 Long term (current) use of opiate analgesic: Secondary | ICD-10-CM | POA: Diagnosis not present

## 2023-01-06 DIAGNOSIS — E538 Deficiency of other specified B group vitamins: Secondary | ICD-10-CM | POA: Diagnosis not present

## 2023-01-06 DIAGNOSIS — E039 Hypothyroidism, unspecified: Secondary | ICD-10-CM | POA: Diagnosis not present

## 2023-01-06 DIAGNOSIS — M545 Low back pain, unspecified: Secondary | ICD-10-CM | POA: Diagnosis not present

## 2023-01-06 DIAGNOSIS — M1990 Unspecified osteoarthritis, unspecified site: Secondary | ICD-10-CM | POA: Diagnosis not present

## 2023-01-06 DIAGNOSIS — Z7982 Long term (current) use of aspirin: Secondary | ICD-10-CM | POA: Diagnosis not present

## 2023-01-06 DIAGNOSIS — G43909 Migraine, unspecified, not intractable, without status migrainosus: Secondary | ICD-10-CM | POA: Diagnosis not present

## 2023-01-06 DIAGNOSIS — E11621 Type 2 diabetes mellitus with foot ulcer: Secondary | ICD-10-CM | POA: Diagnosis not present

## 2023-01-06 DIAGNOSIS — I4510 Unspecified right bundle-branch block: Secondary | ICD-10-CM | POA: Diagnosis not present

## 2023-01-06 DIAGNOSIS — Z794 Long term (current) use of insulin: Secondary | ICD-10-CM | POA: Diagnosis not present

## 2023-01-06 DIAGNOSIS — E785 Hyperlipidemia, unspecified: Secondary | ICD-10-CM | POA: Diagnosis not present

## 2023-01-06 DIAGNOSIS — J449 Chronic obstructive pulmonary disease, unspecified: Secondary | ICD-10-CM | POA: Diagnosis not present

## 2023-01-07 DIAGNOSIS — E1165 Type 2 diabetes mellitus with hyperglycemia: Secondary | ICD-10-CM | POA: Diagnosis not present

## 2023-01-12 DIAGNOSIS — I4510 Unspecified right bundle-branch block: Secondary | ICD-10-CM | POA: Diagnosis not present

## 2023-01-12 DIAGNOSIS — J449 Chronic obstructive pulmonary disease, unspecified: Secondary | ICD-10-CM | POA: Diagnosis not present

## 2023-01-12 DIAGNOSIS — E11621 Type 2 diabetes mellitus with foot ulcer: Secondary | ICD-10-CM | POA: Diagnosis not present

## 2023-01-12 DIAGNOSIS — Z7982 Long term (current) use of aspirin: Secondary | ICD-10-CM | POA: Diagnosis not present

## 2023-01-12 DIAGNOSIS — M1990 Unspecified osteoarthritis, unspecified site: Secondary | ICD-10-CM | POA: Diagnosis not present

## 2023-01-12 DIAGNOSIS — E039 Hypothyroidism, unspecified: Secondary | ICD-10-CM | POA: Diagnosis not present

## 2023-01-12 DIAGNOSIS — F17210 Nicotine dependence, cigarettes, uncomplicated: Secondary | ICD-10-CM | POA: Diagnosis not present

## 2023-01-12 DIAGNOSIS — E785 Hyperlipidemia, unspecified: Secondary | ICD-10-CM | POA: Diagnosis not present

## 2023-01-12 DIAGNOSIS — K5909 Other constipation: Secondary | ICD-10-CM | POA: Diagnosis not present

## 2023-01-12 DIAGNOSIS — Z794 Long term (current) use of insulin: Secondary | ICD-10-CM | POA: Diagnosis not present

## 2023-01-12 DIAGNOSIS — G43909 Migraine, unspecified, not intractable, without status migrainosus: Secondary | ICD-10-CM | POA: Diagnosis not present

## 2023-01-12 DIAGNOSIS — G894 Chronic pain syndrome: Secondary | ICD-10-CM | POA: Diagnosis not present

## 2023-01-12 DIAGNOSIS — Z79891 Long term (current) use of opiate analgesic: Secondary | ICD-10-CM | POA: Diagnosis not present

## 2023-01-12 DIAGNOSIS — M545 Low back pain, unspecified: Secondary | ICD-10-CM | POA: Diagnosis not present

## 2023-01-12 DIAGNOSIS — E538 Deficiency of other specified B group vitamins: Secondary | ICD-10-CM | POA: Diagnosis not present

## 2023-01-12 DIAGNOSIS — E559 Vitamin D deficiency, unspecified: Secondary | ICD-10-CM | POA: Diagnosis not present

## 2023-01-12 DIAGNOSIS — L97412 Non-pressure chronic ulcer of right heel and midfoot with fat layer exposed: Secondary | ICD-10-CM | POA: Diagnosis not present

## 2023-01-12 DIAGNOSIS — E1142 Type 2 diabetes mellitus with diabetic polyneuropathy: Secondary | ICD-10-CM | POA: Diagnosis not present

## 2023-01-16 DIAGNOSIS — N189 Chronic kidney disease, unspecified: Secondary | ICD-10-CM | POA: Diagnosis not present

## 2023-01-16 DIAGNOSIS — E7800 Pure hypercholesterolemia, unspecified: Secondary | ICD-10-CM | POA: Diagnosis not present

## 2023-01-16 DIAGNOSIS — E1165 Type 2 diabetes mellitus with hyperglycemia: Secondary | ICD-10-CM | POA: Diagnosis not present

## 2023-01-16 DIAGNOSIS — E1140 Type 2 diabetes mellitus with diabetic neuropathy, unspecified: Secondary | ICD-10-CM | POA: Diagnosis not present

## 2023-01-16 DIAGNOSIS — E039 Hypothyroidism, unspecified: Secondary | ICD-10-CM | POA: Diagnosis not present

## 2023-01-16 DIAGNOSIS — I10 Essential (primary) hypertension: Secondary | ICD-10-CM | POA: Diagnosis not present

## 2023-01-19 DIAGNOSIS — M1990 Unspecified osteoarthritis, unspecified site: Secondary | ICD-10-CM | POA: Diagnosis not present

## 2023-01-19 DIAGNOSIS — E11621 Type 2 diabetes mellitus with foot ulcer: Secondary | ICD-10-CM | POA: Diagnosis not present

## 2023-01-19 DIAGNOSIS — K5909 Other constipation: Secondary | ICD-10-CM | POA: Diagnosis not present

## 2023-01-19 DIAGNOSIS — M545 Low back pain, unspecified: Secondary | ICD-10-CM | POA: Diagnosis not present

## 2023-01-19 DIAGNOSIS — Z7982 Long term (current) use of aspirin: Secondary | ICD-10-CM | POA: Diagnosis not present

## 2023-01-19 DIAGNOSIS — J449 Chronic obstructive pulmonary disease, unspecified: Secondary | ICD-10-CM | POA: Diagnosis not present

## 2023-01-19 DIAGNOSIS — E538 Deficiency of other specified B group vitamins: Secondary | ICD-10-CM | POA: Diagnosis not present

## 2023-01-19 DIAGNOSIS — G43909 Migraine, unspecified, not intractable, without status migrainosus: Secondary | ICD-10-CM | POA: Diagnosis not present

## 2023-01-19 DIAGNOSIS — E559 Vitamin D deficiency, unspecified: Secondary | ICD-10-CM | POA: Diagnosis not present

## 2023-01-19 DIAGNOSIS — Z794 Long term (current) use of insulin: Secondary | ICD-10-CM | POA: Diagnosis not present

## 2023-01-19 DIAGNOSIS — Z79891 Long term (current) use of opiate analgesic: Secondary | ICD-10-CM | POA: Diagnosis not present

## 2023-01-19 DIAGNOSIS — E1142 Type 2 diabetes mellitus with diabetic polyneuropathy: Secondary | ICD-10-CM | POA: Diagnosis not present

## 2023-01-19 DIAGNOSIS — F17210 Nicotine dependence, cigarettes, uncomplicated: Secondary | ICD-10-CM | POA: Diagnosis not present

## 2023-01-19 DIAGNOSIS — E785 Hyperlipidemia, unspecified: Secondary | ICD-10-CM | POA: Diagnosis not present

## 2023-01-19 DIAGNOSIS — L97412 Non-pressure chronic ulcer of right heel and midfoot with fat layer exposed: Secondary | ICD-10-CM | POA: Diagnosis not present

## 2023-01-19 DIAGNOSIS — G894 Chronic pain syndrome: Secondary | ICD-10-CM | POA: Diagnosis not present

## 2023-01-19 DIAGNOSIS — I4510 Unspecified right bundle-branch block: Secondary | ICD-10-CM | POA: Diagnosis not present

## 2023-01-19 DIAGNOSIS — E039 Hypothyroidism, unspecified: Secondary | ICD-10-CM | POA: Diagnosis not present

## 2023-01-21 ENCOUNTER — Other Ambulatory Visit: Payer: Self-pay | Admitting: Internal Medicine

## 2023-01-22 ENCOUNTER — Telehealth: Payer: Self-pay | Admitting: Podiatry

## 2023-01-22 ENCOUNTER — Inpatient Hospital Stay (HOSPITAL_COMMUNITY)
Admission: EM | Admit: 2023-01-22 | Discharge: 2023-01-25 | DRG: 571 | Disposition: A | Discharge status: Home Health | Payer: 59 | Attending: Internal Medicine | Admitting: Internal Medicine

## 2023-01-22 ENCOUNTER — Other Ambulatory Visit: Payer: Self-pay

## 2023-01-22 ENCOUNTER — Emergency Department (HOSPITAL_COMMUNITY): Payer: 59

## 2023-01-22 DIAGNOSIS — E1142 Type 2 diabetes mellitus with diabetic polyneuropathy: Secondary | ICD-10-CM | POA: Diagnosis not present

## 2023-01-22 DIAGNOSIS — I4510 Unspecified right bundle-branch block: Secondary | ICD-10-CM | POA: Diagnosis not present

## 2023-01-22 DIAGNOSIS — Z7982 Long term (current) use of aspirin: Secondary | ICD-10-CM

## 2023-01-22 DIAGNOSIS — E11621 Type 2 diabetes mellitus with foot ulcer: Secondary | ICD-10-CM | POA: Diagnosis present

## 2023-01-22 DIAGNOSIS — J449 Chronic obstructive pulmonary disease, unspecified: Secondary | ICD-10-CM | POA: Diagnosis present

## 2023-01-22 DIAGNOSIS — E559 Vitamin D deficiency, unspecified: Secondary | ICD-10-CM | POA: Diagnosis present

## 2023-01-22 DIAGNOSIS — E039 Hypothyroidism, unspecified: Secondary | ICD-10-CM | POA: Diagnosis present

## 2023-01-22 DIAGNOSIS — Z794 Long term (current) use of insulin: Secondary | ICD-10-CM

## 2023-01-22 DIAGNOSIS — M1990 Unspecified osteoarthritis, unspecified site: Secondary | ICD-10-CM | POA: Diagnosis present

## 2023-01-22 DIAGNOSIS — Z79890 Hormone replacement therapy (postmenopausal): Secondary | ICD-10-CM

## 2023-01-22 DIAGNOSIS — E1169 Type 2 diabetes mellitus with other specified complication: Secondary | ICD-10-CM | POA: Diagnosis present

## 2023-01-22 DIAGNOSIS — Z888 Allergy status to other drugs, medicaments and biological substances status: Secondary | ICD-10-CM

## 2023-01-22 DIAGNOSIS — Z833 Family history of diabetes mellitus: Secondary | ICD-10-CM

## 2023-01-22 DIAGNOSIS — Z79899 Other long term (current) drug therapy: Secondary | ICD-10-CM

## 2023-01-22 DIAGNOSIS — L97919 Non-pressure chronic ulcer of unspecified part of right lower leg with unspecified severity: Secondary | ICD-10-CM | POA: Diagnosis not present

## 2023-01-22 DIAGNOSIS — F419 Anxiety disorder, unspecified: Secondary | ICD-10-CM | POA: Diagnosis not present

## 2023-01-22 DIAGNOSIS — Z90710 Acquired absence of both cervix and uterus: Secondary | ICD-10-CM

## 2023-01-22 DIAGNOSIS — M86671 Other chronic osteomyelitis, right ankle and foot: Secondary | ICD-10-CM | POA: Diagnosis present

## 2023-01-22 DIAGNOSIS — E538 Deficiency of other specified B group vitamins: Secondary | ICD-10-CM | POA: Diagnosis present

## 2023-01-22 DIAGNOSIS — E785 Hyperlipidemia, unspecified: Secondary | ICD-10-CM | POA: Diagnosis not present

## 2023-01-22 DIAGNOSIS — L97512 Non-pressure chronic ulcer of other part of right foot with fat layer exposed: Secondary | ICD-10-CM | POA: Diagnosis not present

## 2023-01-22 DIAGNOSIS — E1140 Type 2 diabetes mellitus with diabetic neuropathy, unspecified: Secondary | ICD-10-CM | POA: Diagnosis present

## 2023-01-22 DIAGNOSIS — K5909 Other constipation: Secondary | ICD-10-CM | POA: Diagnosis not present

## 2023-01-22 DIAGNOSIS — N179 Acute kidney failure, unspecified: Secondary | ICD-10-CM | POA: Diagnosis present

## 2023-01-22 DIAGNOSIS — L97519 Non-pressure chronic ulcer of other part of right foot with unspecified severity: Secondary | ICD-10-CM | POA: Diagnosis not present

## 2023-01-22 DIAGNOSIS — I10 Essential (primary) hypertension: Secondary | ICD-10-CM | POA: Diagnosis not present

## 2023-01-22 DIAGNOSIS — Z885 Allergy status to narcotic agent status: Secondary | ICD-10-CM

## 2023-01-22 DIAGNOSIS — G894 Chronic pain syndrome: Secondary | ICD-10-CM | POA: Diagnosis present

## 2023-01-22 DIAGNOSIS — M109 Gout, unspecified: Secondary | ICD-10-CM | POA: Diagnosis not present

## 2023-01-22 DIAGNOSIS — Z981 Arthrodesis status: Secondary | ICD-10-CM

## 2023-01-22 DIAGNOSIS — L03115 Cellulitis of right lower limb: Secondary | ICD-10-CM | POA: Diagnosis not present

## 2023-01-22 DIAGNOSIS — Z90722 Acquired absence of ovaries, bilateral: Secondary | ICD-10-CM

## 2023-01-22 DIAGNOSIS — F17210 Nicotine dependence, cigarettes, uncomplicated: Secondary | ICD-10-CM | POA: Diagnosis present

## 2023-01-22 DIAGNOSIS — Z881 Allergy status to other antibiotic agents status: Secondary | ICD-10-CM

## 2023-01-22 DIAGNOSIS — L97511 Non-pressure chronic ulcer of other part of right foot limited to breakdown of skin: Secondary | ICD-10-CM | POA: Diagnosis not present

## 2023-01-22 LAB — COMPREHENSIVE METABOLIC PANEL
ALT: 16 U/L (ref 0–44)
AST: 17 U/L (ref 15–41)
Albumin: 3.2 g/dL — ABNORMAL LOW (ref 3.5–5.0)
Alkaline Phosphatase: 109 U/L (ref 38–126)
Anion gap: 12 (ref 5–15)
BUN: 21 mg/dL — ABNORMAL HIGH (ref 6–20)
CO2: 25 mmol/L (ref 22–32)
Calcium: 8.8 mg/dL — ABNORMAL LOW (ref 8.9–10.3)
Chloride: 96 mmol/L — ABNORMAL LOW (ref 98–111)
Creatinine, Ser: 1.27 mg/dL — ABNORMAL HIGH (ref 0.44–1.00)
GFR, Estimated: 49 mL/min — ABNORMAL LOW (ref >60)
Glucose, Bld: 333 mg/dL — ABNORMAL HIGH (ref 70–99)
Potassium: 4.7 mmol/L (ref 3.5–5.1)
Sodium: 133 mmol/L — ABNORMAL LOW (ref 135–145)
Total Bilirubin: 0.2 mg/dL — ABNORMAL LOW (ref 0.3–1.2)
Total Protein: 7.6 g/dL (ref 6.5–8.1)

## 2023-01-22 LAB — CBC WITH DIFFERENTIAL/PLATELET
Abs Immature Granulocytes: 0.03 10*3/uL (ref 0.00–0.07)
Basophils Absolute: 0.1 10*3/uL (ref 0.0–0.1)
Basophils Relative: 1 %
Eosinophils Absolute: 0.3 10*3/uL (ref 0.0–0.5)
Eosinophils Relative: 3 %
HCT: 44 % (ref 36.0–46.0)
Hemoglobin: 14.4 g/dL (ref 12.0–15.0)
Immature Granulocytes: 0 %
Lymphocytes Relative: 31 %
Lymphs Abs: 3.1 10*3/uL (ref 0.7–4.0)
MCH: 28.7 pg (ref 26.0–34.0)
MCHC: 32.7 g/dL (ref 30.0–36.0)
MCV: 87.8 fL (ref 80.0–100.0)
Monocytes Absolute: 0.7 10*3/uL (ref 0.1–1.0)
Monocytes Relative: 7 %
Neutro Abs: 5.7 10*3/uL (ref 1.7–7.7)
Neutrophils Relative %: 58 %
Platelets: 255 10*3/uL (ref 150–400)
RBC: 5.01 MIL/uL (ref 3.87–5.11)
RDW: 14.7 % (ref 11.5–15.5)
WBC: 9.8 10*3/uL (ref 4.0–10.5)
nRBC: 0 % (ref 0.0–0.2)

## 2023-01-22 LAB — LACTIC ACID, PLASMA: Lactic Acid, Venous: 1.1 mmol/L (ref 0.5–1.9)

## 2023-01-22 NOTE — Telephone Encounter (Signed)
Patient called the answer service. She states that her leg is red and it had heat to it previously. She is concerned that it may be going "septic". The redness is going up the front and side of the leg. No fevers or chills. Due to the redness going up the leg I have recommended going to the ER. She asked if she can go tomorrow and I have recommended that she go today due to the infection.   I have attempted to reach our to the ER triage on two occasions and not able to reach them yet to let them know of her arrival. I let the patient know that by me calling does not mean that she would be seen any quicker.   Trula Slade    --  Addendum: Patient called the answering service at 9:43pm stating that she went to admitted and there was no orders for admission.  She also states I never wrote a note about her going to the hospital.  Discussed that this is a phone encounter as she was not seen in clinic.  I discussed with patient that she needs to go to the emergency room to be evaluated and likely admission given her symptoms.  She states that previously she just been admitted to the hospital.  I explained since a full weekend and she was not seen in clinic I cannot sharply admit her to the hospital to go to emergency room.  Apologize for the inconvenience however that is her best option.  She verbalized understanding.  Trula Slade

## 2023-01-22 NOTE — ED Provider Triage Note (Signed)
Emergency Medicine Provider Triage Evaluation Note  Katherine Herrera , a 57 y.o. female  was evaluated in triage.  Pt complains of right leg swelling, redness.  Patient reports having a chronic ulcer in the right side of her lateral foot that she been following with podiatry with.  History of diabetes.  Has noticed increasing redness extending up her leg since Friday.  She has started using Hysept for her wound.  Denies any fevers or chills.  She expressed some concern to Dr. Jacqualyn Posey with podiatry who encouraged her to seek care in the ER.  She states that she was supposed to be "directly admitted", but this is not done on the weekends.  Review of Systems  Positive: As above Negative: As above  Physical Exam  BP (!) 154/92 (BP Location: Right Arm)   Pulse 90   Temp 98 F (36.7 C) (Oral)   Resp 18   SpO2 95%  Gen:   Awake, no distress   Resp:  Normal effort  MSK:   Moves extremities without difficulty  Other:  Erythema and warmth extending up to her mid shin  Medical Decision Making  Medically screening exam initiated at 10:06 PM.  Appropriate orders placed.  NAKIDA FURUYA was informed that the remainder of the evaluation will be completed by another provider, this initial triage assessment does not replace that evaluation, and the importance of remaining in the ED until their evaluation is complete.     Garald Balding, PA-C 01/22/23 2209

## 2023-01-22 NOTE — ED Triage Notes (Addendum)
Pt states she was instructed by Dr. Jacqualyn Posey (note in chart)  to come to the ED for possible sepsis in right foot.  Redness streaking up her leg as well as heat. No fevers.

## 2023-01-23 ENCOUNTER — Inpatient Hospital Stay (HOSPITAL_COMMUNITY): Payer: 59

## 2023-01-23 ENCOUNTER — Encounter (HOSPITAL_COMMUNITY): Payer: Self-pay | Admitting: Family Medicine

## 2023-01-23 DIAGNOSIS — M1990 Unspecified osteoarthritis, unspecified site: Secondary | ICD-10-CM | POA: Diagnosis present

## 2023-01-23 DIAGNOSIS — M7989 Other specified soft tissue disorders: Secondary | ICD-10-CM | POA: Diagnosis not present

## 2023-01-23 DIAGNOSIS — N179 Acute kidney failure, unspecified: Secondary | ICD-10-CM

## 2023-01-23 DIAGNOSIS — E785 Hyperlipidemia, unspecified: Secondary | ICD-10-CM | POA: Diagnosis present

## 2023-01-23 DIAGNOSIS — G894 Chronic pain syndrome: Secondary | ICD-10-CM | POA: Diagnosis present

## 2023-01-23 DIAGNOSIS — Z7982 Long term (current) use of aspirin: Secondary | ICD-10-CM | POA: Diagnosis not present

## 2023-01-23 DIAGNOSIS — E039 Hypothyroidism, unspecified: Secondary | ICD-10-CM

## 2023-01-23 DIAGNOSIS — L03115 Cellulitis of right lower limb: Secondary | ICD-10-CM

## 2023-01-23 DIAGNOSIS — E1140 Type 2 diabetes mellitus with diabetic neuropathy, unspecified: Secondary | ICD-10-CM | POA: Diagnosis not present

## 2023-01-23 DIAGNOSIS — E538 Deficiency of other specified B group vitamins: Secondary | ICD-10-CM | POA: Diagnosis present

## 2023-01-23 DIAGNOSIS — F17210 Nicotine dependence, cigarettes, uncomplicated: Secondary | ICD-10-CM | POA: Diagnosis present

## 2023-01-23 DIAGNOSIS — L97511 Non-pressure chronic ulcer of other part of right foot limited to breakdown of skin: Secondary | ICD-10-CM

## 2023-01-23 DIAGNOSIS — L97512 Non-pressure chronic ulcer of other part of right foot with fat layer exposed: Secondary | ICD-10-CM

## 2023-01-23 DIAGNOSIS — Z90710 Acquired absence of both cervix and uterus: Secondary | ICD-10-CM | POA: Diagnosis not present

## 2023-01-23 DIAGNOSIS — Z794 Long term (current) use of insulin: Secondary | ICD-10-CM | POA: Diagnosis not present

## 2023-01-23 DIAGNOSIS — I4510 Unspecified right bundle-branch block: Secondary | ICD-10-CM | POA: Diagnosis present

## 2023-01-23 DIAGNOSIS — M109 Gout, unspecified: Secondary | ICD-10-CM | POA: Diagnosis present

## 2023-01-23 DIAGNOSIS — Z79890 Hormone replacement therapy (postmenopausal): Secondary | ICD-10-CM | POA: Diagnosis not present

## 2023-01-23 DIAGNOSIS — K5909 Other constipation: Secondary | ICD-10-CM | POA: Diagnosis present

## 2023-01-23 DIAGNOSIS — J449 Chronic obstructive pulmonary disease, unspecified: Secondary | ICD-10-CM | POA: Diagnosis present

## 2023-01-23 DIAGNOSIS — E11621 Type 2 diabetes mellitus with foot ulcer: Secondary | ICD-10-CM | POA: Diagnosis present

## 2023-01-23 DIAGNOSIS — M86671 Other chronic osteomyelitis, right ankle and foot: Secondary | ICD-10-CM | POA: Diagnosis present

## 2023-01-23 DIAGNOSIS — L97519 Non-pressure chronic ulcer of other part of right foot with unspecified severity: Secondary | ICD-10-CM | POA: Diagnosis present

## 2023-01-23 DIAGNOSIS — I10 Essential (primary) hypertension: Secondary | ICD-10-CM | POA: Diagnosis not present

## 2023-01-23 DIAGNOSIS — F419 Anxiety disorder, unspecified: Secondary | ICD-10-CM

## 2023-01-23 DIAGNOSIS — E1169 Type 2 diabetes mellitus with other specified complication: Secondary | ICD-10-CM | POA: Diagnosis present

## 2023-01-23 DIAGNOSIS — E559 Vitamin D deficiency, unspecified: Secondary | ICD-10-CM | POA: Diagnosis present

## 2023-01-23 DIAGNOSIS — E1142 Type 2 diabetes mellitus with diabetic polyneuropathy: Secondary | ICD-10-CM | POA: Diagnosis present

## 2023-01-23 LAB — CBC
HCT: 38.2 % (ref 36.0–46.0)
Hemoglobin: 12.2 g/dL (ref 12.0–15.0)
MCH: 28.2 pg (ref 26.0–34.0)
MCHC: 31.9 g/dL (ref 30.0–36.0)
MCV: 88.4 fL (ref 80.0–100.0)
Platelets: 230 10*3/uL (ref 150–400)
RBC: 4.32 MIL/uL (ref 3.87–5.11)
RDW: 14.6 % (ref 11.5–15.5)
WBC: 8.4 10*3/uL (ref 4.0–10.5)
nRBC: 0 % (ref 0.0–0.2)

## 2023-01-23 LAB — C-REACTIVE PROTEIN: CRP: 1.4 mg/dL — ABNORMAL HIGH (ref <1.0)

## 2023-01-23 LAB — GLUCOSE, CAPILLARY
Glucose-Capillary: 295 mg/dL — ABNORMAL HIGH (ref 70–99)
Glucose-Capillary: 191 mg/dL — ABNORMAL HIGH (ref 70–99)
Glucose-Capillary: 210 mg/dL — ABNORMAL HIGH (ref 70–99)

## 2023-01-23 LAB — BASIC METABOLIC PANEL
Anion gap: 7 (ref 5–15)
BUN: 20 mg/dL (ref 6–20)
CO2: 25 mmol/L (ref 22–32)
Calcium: 8.3 mg/dL — ABNORMAL LOW (ref 8.9–10.3)
Chloride: 103 mmol/L (ref 98–111)
Creatinine, Ser: 1.07 mg/dL — ABNORMAL HIGH (ref 0.44–1.00)
GFR, Estimated: >60 mL/min (ref >60)
Glucose, Bld: 180 mg/dL — ABNORMAL HIGH (ref 70–99)
Potassium: 4.1 mmol/L (ref 3.5–5.1)
Sodium: 135 mmol/L (ref 135–145)

## 2023-01-23 LAB — CBG MONITORING, ED
Glucose-Capillary: 249 mg/dL — ABNORMAL HIGH (ref 70–99)
Glucose-Capillary: 147 mg/dL — ABNORMAL HIGH (ref 70–99)

## 2023-01-23 LAB — SEDIMENTATION RATE: Sed Rate: 47 mm/hr — ABNORMAL HIGH (ref 0–22)

## 2023-01-23 LAB — PREALBUMIN: Prealbumin: 13 mg/dL — ABNORMAL LOW (ref 18–38)

## 2023-01-23 LAB — LACTIC ACID, PLASMA: Lactic Acid, Venous: 1.4 mmol/L (ref 0.5–1.9)

## 2023-01-23 MED ORDER — ROPINIROLE HCL 0.5 MG PO TABS
0.5000 mg | ORAL_TABLET | Freq: Every day | ORAL | Status: DC
  Administered 2023-01-23 – 2023-01-24 (×2): 0.5 mg via ORAL
  Filled 2023-01-23 (×2): qty 1

## 2023-01-23 MED ORDER — LEVOTHYROXINE SODIUM 75 MCG PO TABS
75.0000 ug | ORAL_TABLET | Freq: Every day | ORAL | Status: DC
  Administered 2023-01-23 – 2023-01-25 (×3): 75 ug via ORAL
  Filled 2023-01-23 (×3): qty 1

## 2023-01-23 MED ORDER — INSULIN ASPART 100 UNIT/ML IJ SOLN
0.0000 [IU] | Freq: Three times a day (TID) | INTRAMUSCULAR | Status: DC
  Administered 2023-01-23: 3 [IU] via SUBCUTANEOUS
  Administered 2023-01-23: 8 [IU] via SUBCUTANEOUS
  Administered 2023-01-24: 2 [IU] via SUBCUTANEOUS
  Administered 2023-01-24: 3 [IU] via SUBCUTANEOUS
  Administered 2023-01-24 – 2023-01-25 (×2): 8 [IU] via SUBCUTANEOUS
  Administered 2023-01-25: 3 [IU] via SUBCUTANEOUS

## 2023-01-23 MED ORDER — ZOLPIDEM TARTRATE 5 MG PO TABS: 5.0000 mg | ORAL_TABLET | Freq: Every evening | ORAL | Status: DC | PRN

## 2023-01-23 MED ORDER — ACETAMINOPHEN 325 MG PO TABS: 650.0000 mg | ORAL_TABLET | Freq: Four times a day (QID) | ORAL | Status: DC | PRN

## 2023-01-23 MED ORDER — INSULIN ASPART 100 UNIT/ML IJ SOLN
0.0000 [IU] | INTRAMUSCULAR | Status: DC
  Administered 2023-01-23: 2 [IU] via SUBCUTANEOUS

## 2023-01-23 MED ORDER — ONDANSETRON HCL 4 MG/2ML IJ SOLN: 4.0000 mg | Freq: Four times a day (QID) | INTRAMUSCULAR | Status: DC | PRN

## 2023-01-23 MED ORDER — VANCOMYCIN HCL 1250 MG/250ML IV SOLN
1250.0000 mg | INTRAVENOUS | Status: DC
  Administered 2023-01-24 – 2023-01-25 (×2): 1250 mg via INTRAVENOUS
  Filled 2023-01-23 (×2): qty 250

## 2023-01-23 MED ORDER — FLUOXETINE HCL 10 MG PO CAPS
10.0000 mg | ORAL_CAPSULE | Freq: Every day | ORAL | Status: DC
  Administered 2023-01-23 – 2023-01-25 (×3): 10 mg via ORAL
  Filled 2023-01-23 (×4): qty 1

## 2023-01-23 MED ORDER — EZETIMIBE 10 MG PO TABS
10.0000 mg | ORAL_TABLET | Freq: Every day | ORAL | Status: DC
  Administered 2023-01-23 – 2023-01-25 (×3): 10 mg via ORAL
  Filled 2023-01-23 (×3): qty 1

## 2023-01-23 MED ORDER — OXYCODONE HCL 5 MG PO TABS
15.0000 mg | ORAL_TABLET | Freq: Four times a day (QID) | ORAL | Status: DC | PRN
  Administered 2023-01-23 – 2023-01-25 (×5): 15 mg via ORAL
  Filled 2023-01-23 (×5): qty 3

## 2023-01-23 MED ORDER — GABAPENTIN 400 MG PO CAPS
800.0000 mg | ORAL_CAPSULE | Freq: Three times a day (TID) | ORAL | Status: DC
  Administered 2023-01-23 – 2023-01-25 (×11): 800 mg via ORAL
  Filled 2023-01-23 (×11): qty 2

## 2023-01-23 MED ORDER — SODIUM CHLORIDE 0.9 % IV SOLN
2.0000 g | Freq: Three times a day (TID) | INTRAVENOUS | Status: DC
  Administered 2023-01-23 – 2023-01-25 (×6): 2 g via INTRAVENOUS
  Filled 2023-01-23 (×6): qty 12.5

## 2023-01-23 MED ORDER — VANCOMYCIN HCL 1500 MG/300ML IV SOLN
1500.0000 mg | Freq: Once | INTRAVENOUS | Status: AC
  Administered 2023-01-23: 1500 mg via INTRAVENOUS
  Filled 2023-01-23: qty 300

## 2023-01-23 MED ORDER — METRONIDAZOLE 500 MG PO TABS
500.0000 mg | ORAL_TABLET | Freq: Two times a day (BID) | ORAL | Status: DC
  Administered 2023-01-23 – 2023-01-25 (×5): 500 mg via ORAL
  Filled 2023-01-23 (×5): qty 1

## 2023-01-23 MED ORDER — LISINOPRIL 10 MG PO TABS
10.0000 mg | ORAL_TABLET | Freq: Every day | ORAL | Status: DC
  Administered 2023-01-23 – 2023-01-25 (×3): 10 mg via ORAL
  Filled 2023-01-23 (×3): qty 1

## 2023-01-23 MED ORDER — ACETAMINOPHEN 650 MG RE SUPP: 650.0000 mg | Freq: Four times a day (QID) | RECTAL | Status: DC | PRN

## 2023-01-23 MED ORDER — INSULIN ASPART 100 UNIT/ML IJ SOLN
4.0000 [IU] | Freq: Three times a day (TID) | INTRAMUSCULAR | Status: DC
  Administered 2023-01-23 – 2023-01-25 (×8): 4 [IU] via SUBCUTANEOUS

## 2023-01-23 MED ORDER — LAMOTRIGINE 25 MG PO TABS
25.0000 mg | ORAL_TABLET | Freq: Two times a day (BID) | ORAL | Status: DC
  Administered 2023-01-23 – 2023-01-25 (×5): 25 mg via ORAL
  Filled 2023-01-23 (×5): qty 1

## 2023-01-23 MED ORDER — CLONAZEPAM 0.5 MG PO TABS: 0.5000 mg | ORAL_TABLET | Freq: Two times a day (BID) | ORAL | Status: DC | PRN

## 2023-01-23 MED ORDER — SODIUM CHLORIDE 0.9 % IV SOLN
2.0000 g | Freq: Once | INTRAVENOUS | Status: AC
  Administered 2023-01-23: 2 g via INTRAVENOUS
  Filled 2023-01-23: qty 12.5

## 2023-01-23 MED ORDER — GADOBUTROL 1 MMOL/ML IV SOLN
8.0000 mL | Freq: Once | INTRAVENOUS | Status: AC | PRN
  Administered 2023-01-23: 8 mL via INTRAVENOUS

## 2023-01-23 MED ORDER — SODIUM CHLORIDE 0.9 % IV SOLN: INTRAVENOUS | Status: AC

## 2023-01-23 MED ORDER — ONDANSETRON HCL 4 MG PO TABS: 4.0000 mg | ORAL_TABLET | Freq: Four times a day (QID) | ORAL | Status: DC | PRN

## 2023-01-23 MED ORDER — INSULIN GLARGINE-YFGN 100 UNIT/ML ~~LOC~~ SOLN
20.0000 [IU] | Freq: Two times a day (BID) | SUBCUTANEOUS | Status: DC
  Administered 2023-01-23 – 2023-01-25 (×5): 20 [IU] via SUBCUTANEOUS
  Filled 2023-01-23 (×6): qty 0.2

## 2023-01-23 MED ORDER — SENNOSIDES-DOCUSATE SODIUM 8.6-50 MG PO TABS: 1.0000 | ORAL_TABLET | Freq: Every evening | ORAL | Status: DC | PRN

## 2023-01-23 NOTE — Inpatient Diabetes Management (Signed)
Inpatient Diabetes Program Recommendations  AACE/ADA: New Consensus Statement on Inpatient Glycemic Control (2015)  Target Ranges:  Prepandial:   less than 140 mg/dL      Peak postprandial:   less than 180 mg/dL (1-2 hours)      Critically ill patients:  140 - 180 mg/dL   Lab Results  Component Value Date   GLUCAP 147 (H) 01/23/2023   HGBA1C 7.5 (H) 04/10/2022    Latest Reference Range & Units 01/23/23 04:53 01/23/23 07:49  Glucose-Capillary 70 - 99 mg/dL 249 (H) Novolog 2 units 147 (H)  (H): Data is abnormally high  Diabetes history: DM2 Outpatient Diabetes medications: 70/30 30 units am, 40 units pm Current orders for Inpatient glycemic control: Semglee 20 units bid, Novolog 0-15 units tid correction  Inpatient Diabetes Program Recommendations:   While pt. Off of 70/30 insulin, please consider: -Add Novolog 4 units tid meal coverage if eats 50% meals  Thank you, Bethena Roys E. Moritz Lever, RN, MSN, CDE  Diabetes Coordinator Inpatient Glycemic Control Team Team Pager [phone] (8am-5pm) 01/23/2023 9:49 AM

## 2023-01-23 NOTE — ED Notes (Addendum)
Bed marked ready; received message from Angelia Mould, RN on [facility] that although bed is marked ready, it does not have a bed and one has been ordered; Rolla Plate to notify this RN when bed available and room is truly ready

## 2023-01-23 NOTE — Progress Notes (Signed)
Interim progress note not for billing  Admitted earlier this morning by my colleague. Have reviewed chart, examined patient. Agree with plan as outlined in h and p. Podiatry will be seeing patient later today. Continuing abx. MRI pending. Continuing vanc/cefepime/flagyl.

## 2023-01-23 NOTE — ED Notes (Signed)
ED TO INPATIENT HANDOFF REPORT  ED Nurse Name and Phone #: Timmothy Sours Name/Age/Gender Katherine Herrera 57 y.o. female Room/Bed: 037C/037C  Code Status   Code Status: Full Code  Home/SNF/Other Home Patient oriented to: self, place, time, and situation Is this baseline? Yes   Triage Complete: Triage complete  Chief Complaint Cellulitis of right leg [id number]  Triage Note Pt states she was instructed by Dr. Jacqualyn Posey (note in chart)  to come to the ED for possible sepsis in right foot.  Redness streaking up her leg as well as heat. No fevers.     Allergies Allergies  Allergen Reactions   Cephalexin Nausea And Vomiting   Demerol  [Meperidine Hcl] Rash   Lovastatin Other (See Comments)    Possible myalgia    Metformin And Related Nausea And Vomiting   Sulfamethoxazole-Trimethoprim     Other reaction(s): Unknown DILI, pancreatitis   Crestor [Rosuvastatin Calcium]     myalgia   Hydromorphone     Other reaction(s): Unknown   Niacin Other (See Comments)    Unknown    Other Other (See Comments)   Hydromorphone Hcl Itching    Patient has been tolerating Hydromorphone tablets without adverse effect (07/09/19) Other reaction(s): Unknown   Paroxetine Other (See Comments)    Unknown     Level of Care/Admitting Diagnosis ED Disposition     ED Disposition  Admit   Condition  --   [place]: [facility] [100100]  Level of Care: Med-Surg [16]  May admit patient to [facility] or [facility] if equivalent level of care is available:: No  Covid Evaluation: Asymptomatic - no recent exposure (last 10 days) testing not required  Diagnosis: Cellulitis of right leg [id number]  Admitting Physician: Vianne Bulls [id number]  Attending Physician: Vianne Bulls [id number]  Certification:: I certify this patient will need inpatient services for at least 2 midnights  Estimated Length of Stay: 3          B Medical/Surgery History Past Medical  History:  Diagnosis Date   Anxiety    Arthritis    hands   B12 deficiency    Chronic constipation    Chronic pain syndrome    neck, lower back, and foot   COPD (chronic obstructive pulmonary disease) ([place])    Diabetic ulcer of right foot ([place])    Gout    possible   History of seizure    05-26-2020  per pt as child and last one in late teen's   History of sepsis    due to lower extremity cellulits 2019 left , right 03/ 2021   History of syncope    Hyperlipidemia    Hypothyroidism    Impaired range of motion of cervical spine    per pt hx cervical fusion C4 -- 7,  has to be careful about turning her head to the right can cause her to pass out   Insulin dependent type 2 diabetes mellitus [facility])    endocrinologist--- dr Chalmers Cater    (05-26-2020 per pt check's blood sugar twice dialy and at times a third time,  fasting am blood sugar's---- 200 -- 250)   LBP (low back pain)    DR Patrice Paradise   Migraine    Peripheral neuropathy    RBBB (right bundle branch block)    Tenosynovitis of foot and ankle 09/03/2013   TTS (tarsal tunnel syndrome) 2010   Left Foot [facility] in [place]   Vitamin D  deficiency    Wears glasses    Past Surgical History:  Procedure Laterality Date   ANTERIOR CERVICAL DECOMP/DISCECTOMY FUSION  02-12-2004;  07-06-2006   C4--5 in 2005;  C5 --7  in  [id number]   [place] Right 07/05/2019   Procedure: Application Of Wound Vac;  Surgeon: Evelina Bucy, DPM;  Location: [place];  Service: Podiatry;  Laterality: Right;   APPLICATION OF WOUND VAC Right 04/10/2022   Procedure: APPLICATION OF WOUND VAC, PLACEMENT OF ANTIBIOTIC BEADS;  Surgeon: [name], DPM;  Location: [place];  Service: Podiatry;  Laterality: Right;   BONE BIOPSY Right 05/29/2020   Procedure: BONE BIOPSY;  Surgeon: Evelina Bucy, DPM;  Location: [place];  Service: Podiatry;  Laterality: Right;   CARDIAC CATHETERIZATION  05-27-2002   dr Einar Gip   for positive cardiolite;  normal coronaries  and LVF, ef 70%   CARPAL TUNNEL RELEASE Bilateral left 12-25-2008;  right 09-22-2009  both [facility]    HARDWARE REMOVAL  01-07-2016  [facility]    right foot   INCISION AND DRAINAGE OF WOUND Right 07/20/2019   Procedure: IRRIGATION AND DEBRIDEMENT foot;  Surgeon: Evelina Bucy, DPM;  [facility];  Service: Podiatry;  Laterality: Right;   INCISION AND DRAINAGE OF WOUND Right 07/22/2019   Procedure: IRRIGATION AND DEBRIDEMENT RIGHT FOOT; APPLICATION OF WOUND VAC;  Surgeon: Evelina Bucy, DPM;  [facility];  Service: Podiatry;  Laterality: Right;   INCISION AND DRAINAGE OF WOUND Right 07/24/2019   Procedure: IRRIGATION AND DEBRIDEMENT WOUND, APPLICATION OF WOUND VAC;  Surgeon: Evelina Bucy, DPM;  [facility];  Service: Podiatry;  Laterality: Right;   INCISION AND DRAINAGE OF WOUND Right 05/29/2020   Procedure: RIGHT FOOT WOUND DEBRIDEMENT AND IRRIGATION;  Surgeon: Evelina Bucy, DPM;  Location: [place];  Service: Podiatry;  Laterality: Right;   INCISION AND DRAINAGE OF WOUND Right 04/13/2022   Procedure: IRRIGATION AND DEBRIDEMENT WOUND;  Surgeon: Felipa Furnace, DPM;  Location: [place];  Service: Podiatry;  Laterality: Right;   IRRIGATION AND DEBRIDEMENT FOOT Right 04/10/2022   Procedure: IRRIGATION AND DEBRIDEMENT FOOT;  Surgeon: [name], DPM;  Location: [place];  Service: Podiatry;  Laterality: Right;   LUMBAR DISC SURGERY  07-07-2005   @[facility]    L5 - S1   METATARSAL HEAD EXCISION Bilateral 01/02/2019   Procedure: Left foot 5th metatarsal resection, wound excision, adjacent tissue transfer. Right fifth metatarsal head resection, fitfth metatarsal base resection, intermediate wound repair.;  Surgeon: Evelina Bucy, DPM;  Location: [place];  Service: Podiatry;  Laterality: Bilateral;   METATARSAL HEAD EXCISION Right 05/12/2021   Procedure: Right 5th met base resection;  Surgeon: Evelina Bucy, DPM;  [facility];  Service: Podiatry;   Laterality: Right;   METATARSAL OSTEOTOMY Right 07/05/2019   Procedure: RIGHT FOOT 5TH METATARSAL RESECTION; PERONEAL TENDONESIS, EXCISION OF ULCER, LOCAL TISSUE TRANSFER ;  Surgeon: Evelina Bucy, DPM;  Location: [place];  Service: Podiatry;  Laterality: Right;   ORIF METATARSAL FRACTURE  09-22-2015  @[facility]    right 5th   TOTAL ABDOMINAL HYSTERECTOMY W/ BILATERAL SALPINGOOPHORECTOMY  2000 approx.     A IV Location/Drains/Wounds Patient Lines/Drains/Airways Status     Active Line/Drains/Airways     Name Placement date Placement time Site Days   Peripheral IV 01/23/23 20 G Left Wrist 01/23/23  0300  Wrist  less than 1   Negative Pressure Wound Therapy Foot Right 04/10/22  1047  --  288   Incision (Closed) 05/12/21 Foot 05/12/21  1346  -- 621   Incision (Closed) 04/10/22 Foot Right 04/10/22  1102  -- 288   Pressure Injury 11/27/21 Foot Anterior;Right Unstageable - Full thickness tissue loss in which the base of the injury is covered by slough (yellow, tan, gray, green or brown) and/or eschar (tan, brown or black) in the wound bed. diabetic ulcer to medial  11/27/21  1610  -- 422            Intake/Output Last 24 hours  Intake/Output Summary (Last 24 hours) at 01/23/2023 [id number] Last data filed at 01/23/2023 0425 Gross per 24 hour  Intake 100 ml  Output --  Net 100 ml    Labs/Imaging Results for orders placed or performed during the hospital encounter of 01/22/23 (from the past 48 hour(s))  Lactic acid, plasma     Status: None   Collection Time: 01/22/23 10:09 PM  Result Value Ref Range   Lactic Acid, Venous 1.1 0.5 - 1.9 mmol/L    Comment: Performed at [facility], [address], [place], [place] [address]  Comprehensive metabolic panel     Status: Abnormal   Collection Time: 01/22/23 10:09 PM  Result Value Ref Range   Sodium 133 (L) 135 - 145 mmol/L   Potassium 4.7 3.5 - 5.1 mmol/L   Chloride 96 (L) 98 - 111 mmol/L   CO2 25 22 - 32 mmol/L   Glucose, Bld 333 (H) 70 - 99  mg/dL    Comment: Glucose reference range applies only to samples taken after fasting for at least 8 hours.   BUN 21 (H) 6 - 20 mg/dL   Creatinine, Ser 1.27 (H) 0.44 - 1.00 mg/dL   Calcium 8.8 (L) 8.9 - 10.3 mg/dL   Total Protein 7.6 6.5 - 8.1 g/dL   Albumin 3.2 (L) 3.5 - 5.0 g/dL   AST 17 15 - 41 U/L   ALT 16 0 - 44 U/L   Alkaline Phosphatase 109 38 - 126 U/L   Total Bilirubin 0.2 (L) 0.3 - 1.2 mg/dL   GFR, Estimated 49 (L) >60 mL/min    Comment: (NOTE) Calculated using the CKD-EPI Creatinine Equation (2021)    Anion gap 12 5 - 15    Comment: Performed at [facility] [address] [place], [place] [address]  CBC with Differential     Status: None   Collection Time: 01/22/23 10:09 PM  Result Value Ref Range   WBC 9.8 4.0 - 10.5 K/uL   RBC 5.01 3.87 - 5.11 MIL/uL   Hemoglobin 14.4 12.0 - 15.0 g/dL   HCT 44.0 36.0 - 46.0 %   MCV 87.8 80.0 - 100.0 fL   MCH 28.7 26.0 - 34.0 pg   MCHC 32.7 30.0 - 36.0 g/dL   RDW 14.7 11.5 - 15.5 %   Platelets 255 150 - 400 K/uL   nRBC 0.0 0.0 - 0.2 %   Neutrophils Relative % 58 %   Neutro Abs 5.7 1.7 - 7.7 K/uL   Lymphocytes Relative 31 %   Lymphs Abs 3.1 0.7 - 4.0 K/uL   Monocytes Relative 7 %   Monocytes Absolute 0.7 0.1 - 1.0 K/uL   Eosinophils Relative 3 %   Eosinophils Absolute 0.3 0.0 - 0.5 K/uL   Basophils Relative 1 %   Basophils Absolute 0.1 0.0 - 0.1 K/uL   Immature Granulocytes 0 %   Abs Immature Granulocytes 0.03 0.00 -  0.07 K/uL    Comment: Performed at [facility], [place] [address] [place], [place] [address]  Lactic acid, plasma     Status: None   Collection Time: 01/23/23  3:06 AM  Result Value Ref Range   Lactic Acid, Venous 1.4 0.5 - 1.9 mmol/L    Comment: Performed at [facility] [address] [place], [place] [address]  CBG monitoring, ED     Status: Abnormal   Collection Time: 01/23/23  4:53 AM  Result Value Ref Range   Glucose-Capillary 249 (H) 70 - 99 mg/dL    Comment: Glucose reference  range applies only to samples taken after fasting for at least 8 hours.   *Note: Due to a large number of results and/or encounters for the requested time period, some results have not been displayed. A complete set of results can be found in Results Review.   DG Tibia/Fibula Right  Result Date: 01/22/2023 CLINICAL DATA:  Ulcer and infection EXAM: RIGHT TIBIA AND FIBULA - 2 VIEW COMPARISON:  None Available. FINDINGS: Diffuse bone demineralization. No evidence of acute fracture or dislocation. No focal bone lesion or bone erosion. Soft tissues are unremarkable. IMPRESSION: No acute bony abnormalities. Electronically Signed   By: Lucienne Capers M.D.   On: 01/22/2023 22:47   DG Foot Complete Right  Result Date: 01/22/2023 CLINICAL DATA:  Ulcer and infection. EXAM: RIGHT FOOT COMPLETE - 3+ VIEW COMPARISON:  04/08/2022 FINDINGS: Diffuse bone demineralization. Previous resection or resorption of the distal right fifth metatarsal bone, unchanged. Bone erosion and sclerosis involving the base of the fifth metatarsal in the adjacent cuboidal bone likely representing osteomyelitis. Appearance is similar to prior study. No new areas of bone resorption or appreciated. Degenerative changes in the interphalangeal and intertarsal joints. No radiopaque soft tissue foreign bodies or soft tissue gas. IMPRESSION: Changes of chronic osteomyelitis suggested at the base of the fifth metatarsal and adjacent cuboidal bone similar to prior study. Soft tissue changes are improving since the prior study. Electronically Signed   By: Lucienne Capers M.D.   On: 01/22/2023 22:46    Pending Labs Unresulted Labs (From admission, onward)     Start     Ordered   01/23/23 0500  Hemoglobin A1c  Tomorrow morning,   R       Comments: To assess prior glycemic control    01/23/23 0414   01/23/23 0500  Sedimentation rate  Daily,   R      01/23/23 0414   01/23/23 0500  C-reactive protein  Daily,   R      01/23/23 0414   01/23/23  0500  Prealbumin  Tomorrow morning,   R        01/23/23 0414   01/23/23 [id number]  Basic metabolic panel  Daily,   R      01/23/23 0414   01/23/23 0500  CBC  Daily,   R      01/23/23 0414   01/22/23 2209  Blood culture (routine x 2)  BLOOD CULTURE X 2,   R (with STAT occurrences)      01/22/23 2208            Vitals/Pain Today's Vitals   01/22/23 2203 01/23/23 0136 01/23/23 0226 01/23/23 0354  BP: (!) 154/92 134/71 126/78   Pulse: 90 78 77   Resp: 18 14 15    Temp: 98 F (36.7 C) 98.2 F (36.8 C) (!) 97.4 F (36.3 C)   TempSrc: Oral Oral Oral  SpO2: 95% 96% 95%   PainSc:    9     Isolation Precautions No active isolations  Medications Medications  oxyCODONE (Oxy IR/ROXICODONE) immediate release tablet 15 mg (15 mg Oral Given 01/23/23 0354)  FLUoxetine (PROZAC) capsule 10 mg (has no administration in time range)  levothyroxine (SYNTHROID) tablet 75 mcg (has no administration in time range)  clonazePAM (KLONOPIN) tablet 0.5 mg (has no administration in time range)  gabapentin (NEURONTIN) capsule 800 mg (has no administration in time range)  lamoTRIgine (LAMICTAL) tablet 25 mg (has no administration in time range)  rOPINIRole (REQUIP) tablet 0.5 mg (has no administration in time range)  insulin aspart (novoLOG) injection 0-6 Units (2 Units Subcutaneous Given 01/23/23 0519)  insulin glargine-yfgn (SEMGLEE) injection 20 Units (has no administration in time range)  acetaminophen (TYLENOL) tablet 650 mg (has no administration in time range)    Or  acetaminophen (TYLENOL) suppository 650 mg (has no administration in time range)  0.9 %  sodium chloride infusion ( Intravenous New Bag/Given 01/23/23 0522)  senna-docusate (Senokot-S) tablet 1 tablet (has no administration in time range)  ondansetron (ZOFRAN) tablet 4 mg (has no administration in time range)    Or  ondansetron (ZOFRAN) injection 4 mg (has no administration in time range)  metroNIDAZOLE (FLAGYL) tablet 500 mg (has no  administration in time range)  vancomycin (VANCOREADY) IVPB 1250 mg/250 mL (has no administration in time range)  ceFEPIme (MAXIPIME) 2 g in sodium chloride 0.9 % 100 mL IVPB (has no administration in time range)  ceFEPIme (MAXIPIME) 2 g in sodium chloride 0.9 % 100 mL IVPB (0 g Intravenous Stopped 01/23/23 0425)  vancomycin (VANCOREADY) IVPB 1500 mg/300 mL (1,500 mg Intravenous New Bag/Given 01/23/23 0458)    Mobility walks     Focused Assessments    R Recommendations: See Admitting Provider Note  Report given to:   Additional Notes:

## 2023-01-23 NOTE — Consult Note (Signed)
Subjective:  Patient ID: Katherine Herrera, female    DOB: 01-01-1966,  MRN: [id number]  Patient with past medical history of DM type 2, chronic pain, anxiety, HTN, hypothyroidism seen at beside today for concern of right foot ulceration and worsening redness swelling and pain up her right leg. She has been in the care of Dr. Posey Pronto and had plans to undergo elective surgery for tendon lengthonings in May. Relates over the weekend her right leg began swelling and getting worse and was advised to come into the ED. Denies any fever, n,v,c. Denies any pain in the foot and most of the pain is in her leg.    Past Medical History:  Diagnosis Date   Anxiety    Arthritis    hands   B12 deficiency    Chronic constipation    Chronic pain syndrome    neck, lower back, and foot   COPD (chronic obstructive pulmonary disease) ([place])    Diabetic ulcer of right foot ([place])    Gout    possible   History of seizure    05-26-2020  per pt as child and last one in late teen's   History of sepsis    due to lower extremity cellulits 2019 left , right 03/ 2021   History of syncope    Hyperlipidemia    Hypothyroidism    Impaired range of motion of cervical spine    per pt hx cervical fusion C4 -- 7,  has to be careful about turning her head to the right can cause her to pass out   Insulin dependent type 2 diabetes mellitus [facility])    endocrinologist--- dr Chalmers Cater    (05-26-2020 per pt check's blood sugar twice dialy and at times a third time,  fasting am blood sugar's---- 200 -- 250)   LBP (low back pain)    DR Patrice Paradise   Migraine    Peripheral neuropathy    RBBB (right bundle branch block)    Tenosynovitis of foot and ankle 09/03/2013   TTS (tarsal tunnel syndrome) 2010   Left Foot [facility] in [place]   Vitamin D deficiency    Wears glasses      Past Surgical History:  Procedure Laterality Date   ANTERIOR CERVICAL DECOMP/DISCECTOMY FUSION  02-12-2004;  07-06-2006   C4--5 in 2005;  C5 --7  in  [id number]   [place] Right 07/05/2019   Procedure: Application Of Wound Vac;  Surgeon: Evelina Bucy, DPM;  Location: [place];  Service: Podiatry;  Laterality: Right;   APPLICATION OF WOUND VAC Right 04/10/2022   Procedure: APPLICATION OF WOUND VAC, PLACEMENT OF ANTIBIOTIC BEADS;  Surgeon: [name], DPM;  Location: [place];  Service: Podiatry;  Laterality: Right;   BONE BIOPSY Right 05/29/2020   Procedure: BONE BIOPSY;  Surgeon: Evelina Bucy, DPM;  Location: [place];  Service: Podiatry;  Laterality: Right;   CARDIAC CATHETERIZATION  05-27-2002   dr Einar Gip   for positive cardiolite;  normal coronaries and LVF, ef 70%   CARPAL TUNNEL RELEASE Bilateral left 12-25-2008;  right 09-22-2009  both @[facility]    HARDWARE REMOVAL  01-07-2016  @[facility]    right foot   INCISION AND DRAINAGE OF WOUND Right 07/20/2019   Procedure: IRRIGATION AND DEBRIDEMENT foot;  Surgeon: Evelina Bucy, DPM;  [facility];  Service: Podiatry;  Laterality: Right;   INCISION AND DRAINAGE OF WOUND Right 07/22/2019   Procedure: IRRIGATION AND DEBRIDEMENT RIGHT FOOT; APPLICATION OF WOUND  VAC;  Surgeon: Evelina Bucy, DPM;  [facility];  Service: Podiatry;  Laterality: Right;   INCISION AND DRAINAGE OF WOUND Right 07/24/2019   Procedure: IRRIGATION AND DEBRIDEMENT WOUND, APPLICATION OF WOUND VAC;  Surgeon: Evelina Bucy, DPM;  [facility];  Service: Podiatry;  Laterality: Right;   INCISION AND DRAINAGE OF WOUND Right 05/29/2020   Procedure: RIGHT FOOT WOUND DEBRIDEMENT AND IRRIGATION;  Surgeon: Evelina Bucy, DPM;  Location: [place];  Service: Podiatry;  Laterality: Right;   INCISION AND DRAINAGE OF WOUND Right 04/13/2022   Procedure: IRRIGATION AND DEBRIDEMENT WOUND;  Surgeon: Felipa Furnace, DPM;  Location: [place];  Service: Podiatry;  Laterality: Right;   IRRIGATION AND DEBRIDEMENT FOOT Right 04/10/2022   Procedure: IRRIGATION AND DEBRIDEMENT FOOT;  Surgeon: [name], DPM;   Location: [place];  Service: Podiatry;  Laterality: Right;   LUMBAR DISC SURGERY  07-07-2005   @[facility]    L5 - S1   METATARSAL HEAD EXCISION Bilateral 01/02/2019   Procedure: Left foot 5th metatarsal resection, wound excision, adjacent tissue transfer. Right fifth metatarsal head resection, fitfth metatarsal base resection, intermediate wound repair.;  Surgeon: Evelina Bucy, DPM;  Location: [place];  Service: Podiatry;  Laterality: Bilateral;   METATARSAL HEAD EXCISION Right 05/12/2021   Procedure: Right 5th met base resection;  Surgeon: Evelina Bucy, DPM;  [facility];  Service: Podiatry;  Laterality: Right;   METATARSAL OSTEOTOMY Right 07/05/2019   Procedure: RIGHT FOOT 5TH METATARSAL RESECTION; PERONEAL TENDONESIS, EXCISION OF ULCER, LOCAL TISSUE TRANSFER ;  Surgeon: Evelina Bucy, DPM;  Location: [place];  Service: Podiatry;  Laterality: Right;   ORIF METATARSAL FRACTURE  09-22-2015  @[facility]    right 5th   TOTAL ABDOMINAL HYSTERECTOMY W/ BILATERAL SALPINGOOPHORECTOMY  2000 approx.       Latest Ref Rng & Units 01/23/2023    7:23 AM 01/22/2023   10:09 PM 04/15/2022    2:43 AM  CBC  WBC 4.0 - 10.5 K/uL 8.4  9.8  13.4   Hemoglobin 12.0 - 15.0 g/dL 12.2  14.4  14.0   Hematocrit 36.0 - 46.0 % 38.2  44.0  42.1   Platelets 150 - 400 K/uL 230  255  303        Latest Ref Rng & Units 01/23/2023    7:23 AM 01/22/2023   10:09 PM 04/16/2022    9:58 PM  BMP  Glucose 70 - 99 mg/dL 180  333  410   BUN 6 - 20 mg/dL 20  21    Creatinine 0.44 - 1.00 mg/dL 1.07  1.27    Sodium 135 - 145 mmol/L 135  133    Potassium 3.5 - 5.1 mmol/L 4.1  4.7    Chloride 98 - 111 mmol/L 103  96    CO2 22 - 32 mmol/L 25  25    Calcium 8.9 - 10.3 mg/dL 8.3  8.8       Objective:   Vitals:   01/23/23 0800 01/23/23 1016  BP: 111/74 118/79  Pulse: 71 72  Resp: 18 16  Temp:  98 F (36.7 C)  SpO2: 96% 100%    General:AA&O x 3. Normal mood and affect   Vascular: DP and PT pulses 2/4 bilateral.  Brisk capillary refill to all digits. Pedal hair present   Neruological. Epicritic sensation grossly intact.   Derm: Plantar lateral right foot wound with fibrinous greanular base measuring 3 cm x 4 cm  x 0.3 cm . No erythema or edema present. No purulence. No probe to bone. Erythema and edema noted to right leg starting at ankle and spreading to knee. Does appear to have improvement from this morning noted.        MSK: MMT 5/5 in dorsiflexion, plantar flexion, inversion and eversion. Normal joint ROM without pain or crepitus.    Procedure: Excisional Debridement of Wound Rationale: Removal of non-viable soft tissue from the wound to promote healing.  Anesthesia: none Pre-Debridement Wound Measurements: Overlying callus and slough  Post-Debridement Wound Measurements: 4 cm x 3 cm x 0.3 cm  Type of Debridement: Sharp Excisional Tissue Removed: Non-viable soft tissue Depth of Debridement: subcutaneous tissue. Technique: Sharp excisional debridement to bleeding, viable wound base.  Dressing: Dry, sterile, compression dressing. Disposition: Patient tolerated procedure well.  Right foot X-ray IMPRESSION: Changes of chronic osteomyelitis suggested at the base of the fifth metatarsal and adjacent cuboidal bone similar to prior study. Soft tissue changes are improving since the prior study.  Assessment & Plan:  Patient was evaluated and treated and all questions answered.  DX: Right diabetic foot wound, cellulitis right leg Wound care: betadine, DSD to right foot   Antibiotics: Per primary currently on vanc, cefepime and flagy  Discussed with patient diagnosis and treatment options. Imaging reviewed. Chronic changes noted to base of right fifth metatarsal with no new osseous erosions noted.  Awaiting MRI Discussed with patient treatment course and cellulitis of leg. Will await results of MRI. If no changes or abscess on MRI will likely not need any surgical intervention and should  continue antibiotics for cellulitis.   Patient in agreement with plan and all questions answered.  Podiatry will continue to follow  Lorenda Peck, DPM  Accessible via secure chat for questions or concerns.

## 2023-01-23 NOTE — Progress Notes (Signed)
Pharmacy Antibiotic Note  Katherine Herrera is a 57 y.o. female admitted on 01/22/2023 with RLE cellulitis.  Pharmacy has been consulted for Vancomycin and Cefepime  dosing.  Vancomycin 1500 ng IV given in ED at  0500  Plan: Vancomycin 1250 mg IV q24h Cefepime 2 g IV q8h     Temp (24hrs), Avg:97.9 F (36.6 C), Min:97.4 F (36.3 C), Max:98.2 F (36.8 C)  Recent Labs  Lab 01/22/23 2209 01/23/23 0306  WBC 9.8  --   CREATININE 1.27*  --   LATICACIDVEN 1.1 1.4    CrCl cannot be calculated (Unknown ideal weight.).    Allergies  Allergen Reactions   Cephalexin Nausea And Vomiting   Demerol  [Meperidine Hcl] Rash   Lovastatin Other (See Comments)    Possible myalgia    Metformin And Related Nausea And Vomiting   Sulfamethoxazole-Trimethoprim     Other reaction(s): Unknown DILI, pancreatitis   Crestor [Rosuvastatin Calcium]     myalgia   Hydromorphone     Other reaction(s): Unknown   Niacin Other (See Comments)    Unknown    Other Other (See Comments)   Hydromorphone Hcl Itching    Patient has been tolerating Hydromorphone tablets without adverse effect (07/09/19) Other reaction(s): Unknown   Paroxetine Other (See Comments)    Unknown     Caryl Pina 01/23/2023 5:12 AM

## 2023-01-23 NOTE — Progress Notes (Signed)
Initial Nutrition Assessment  DOCUMENTATION CODES:   Not applicable  INTERVENTION:   Multivitamin w/ minerals daily Glucerna Shake po TID, each supplement provides 220 kcal and 10 grams of protein Encourage good PO intake Snack daily  NUTRITION DIAGNOSIS:   Increased nutrient needs related to wound healing as evidenced by estimated needs.  GOAL:   Patient will meet greater than or equal to 90% of their needs  MONITOR:   PO intake, Supplement acceptance, Labs, Skin  REASON FOR ASSESSMENT:   Consult Wound healing  ASSESSMENT:   57 y.o. female presented to the ED with worsening redness, pain and swelling of RLE. PMH includes COPD, HTN, HLD, T2DM, and anxiety. Pt admitted with RLE cellulitis and AKI.   Pt sleeping at time of RD visit, woke to RD voice. Pt reports that her appetite was not good at home PTA and remains fairly poor. Reports typically only eating two meals per day, but also grazes a bit. Reports liking eggs, salads, and fish. Denies any nutritional supplement use at home PTA.  Reports ~11# unintentional weight loss over the past 4 months. Per EMR, pt with a 5% weight loss within 8 months, this is not clinically significant for time frame.   Discussed the use of oral nutrition supplements due to poor PO intake and increased needs for wound healing, pt agreeable to trial of supplement. Pt also agreeable to RD ordering snack since pt typically grazes throughout the day.   Medications reviewed and include: NovoLog SSI + 4 units - TID, Semglee, Flagyl, IV antibiotics  Labs reviewed: Sodium 133, Potassium 4.7, BUN 22, Creatinine 1.04, CRP 1.3, Hgb A1c 10.6%, 24 hr CBGs 132-295  NUTRITION - FOCUSED PHYSICAL EXAM:  Flowsheet Row Most Recent Value  Orbital Region No depletion  Upper Arm Region No depletion  Thoracic and Lumbar Region No depletion  Buccal Region No depletion  Temple Region No depletion  Clavicle Bone Region Mild depletion  Clavicle and Acromion Bone  Region No depletion  Scapular Bone Region No depletion  Dorsal Hand No depletion  Patellar Region No depletion  Anterior Thigh Region No depletion  Posterior Calf Region No depletion  Edema (RD Assessment) None  Hair Reviewed  Eyes Reviewed  Mouth Reviewed  Skin Reviewed  Nails Unable to assess  [Nail polish]   Diet Order:   Diet Order             Diet Carb Modified Fluid consistency: Thin; Room service appropriate? Yes  Diet effective now                   EDUCATION NEEDS:   Education needs have been addressed  Skin:  Skin Assessment: Reviewed RN Assessment  Last BM:  3/24  Height:  Ht Readings from Last 1 Encounters:  01/23/23 5\' 9"  (1.753 m)   Weight:  Wt Readings from Last 1 Encounters:  01/23/23 91.2 kg   Ideal Body Weight:  65.9 kg  BMI:  Body mass index is 29.69 kg/m.  Estimated Nutritional Needs:  Kcal:  2100-2300 Protein:  105-125 grams Fluid:  >/= 2 L   Hermina Barters RD, LDN Clinical Dietitian See [facility] for contact information.

## 2023-01-23 NOTE — H&P (Signed)
History and Physical    Katherine Herrera [id number] DOB: Sep 15, 1966 DOA: 01/22/2023  PCP: Cassandria Anger, MD   Patient coming from: Home   Chief Complaint: Right leg redness, swelling, and heat   HPI: Katherine Herrera is a 57 y.o. female with medical history significant for insulin-dependent diabetes mellitus, chronic pain, anxiety, hypertension, hypothyroidism, and chronic right foot ulcer followed by podiatry who presents to the emergency department with worsening redness, pain, swelling, and heat traveling up her right leg.  Patient reports that she noticed redness, heat, and swelling involving her lower right leg the night of 01/20/2023.  Redness and swelling has progressed since then.  She called her podiatry clinic and was advised to seek evaluation in the emergency department.  ED Course: Upon arrival to the ED, patient is found to be afebrile and saturating mid 90s on room air with normal heart rate and stable blood pressure.  Labs are most notable for normal lactic acid, glucose 333, and creatinine 1.27.  Plain radiographs of the right foot are notable for chronic osteomyelitis involving the right foot.  Blood culture was collected in the ED, cefepime and vancomycin were administered, and MRI right foot was ordered.  Review of Systems:  All other systems reviewed and apart from HPI, are negative.  Past Medical History:  Diagnosis Date   Anxiety    Arthritis    hands   B12 deficiency    Chronic constipation    Chronic pain syndrome    neck, lower back, and foot   COPD (chronic obstructive pulmonary disease) ([place])    Diabetic ulcer of right foot ([place])    Gout    possible   History of seizure    05-26-2020  per pt as child and last one in late teen's   History of sepsis    due to lower extremity cellulits 2019 left , right 03/ 2021   History of syncope    Hyperlipidemia    Hypothyroidism    Impaired range of motion of cervical spine    per pt hx cervical fusion  C4 -- 7,  has to be careful about turning her head to the right can cause her to pass out   Insulin dependent type 2 diabetes mellitus [facility])    endocrinologist--- dr Chalmers Cater    (05-26-2020 per pt check's blood sugar twice dialy and at times a third time,  fasting am blood sugar's---- 200 -- 250)   LBP (low back pain)    DR Patrice Paradise   Migraine    Peripheral neuropathy    RBBB (right bundle branch block)    Tenosynovitis of foot and ankle 09/03/2013   TTS (tarsal tunnel syndrome) 2010   Left Foot [facility] in [place]   Vitamin D deficiency    Wears glasses     Past Surgical History:  Procedure Laterality Date   ANTERIOR CERVICAL DECOMP/DISCECTOMY FUSION  02-12-2004;  07-06-2006   C4--5 in 2005;  C5 --7  in  [id number]   [place] Right 07/05/2019   Procedure: Application Of Wound Vac;  Surgeon: Evelina Bucy, DPM;  Location: [facility];  Service: Podiatry;  Laterality: Right;   APPLICATION OF WOUND VAC Right 04/10/2022   Procedure: APPLICATION OF WOUND VAC, PLACEMENT OF ANTIBIOTIC BEADS;  Surgeon: [name], DPM;  Location: [place];  Service: Podiatry;  Laterality: Right;   BONE BIOPSY Right 05/29/2020   Procedure: BONE BIOPSY;  Surgeon: Evelina Bucy, DPM;  Location: [facility];  Service: Podiatry;  Laterality: Right;   CARDIAC CATHETERIZATION  05-27-2002   dr Einar Gip   for positive cardiolite;  normal coronaries and LVF, ef 70%   CARPAL TUNNEL RELEASE Bilateral left 12-25-2008;  right 09-22-2009  both @[facility]    HARDWARE REMOVAL  01-07-2016  @[facility]    right foot   INCISION AND DRAINAGE OF WOUND Right 07/20/2019   Procedure: IRRIGATION AND DEBRIDEMENT foot;  Surgeon: Evelina Bucy, DPM;  Location: [facility];  Service: Podiatry;  Laterality: Right;   INCISION AND DRAINAGE OF WOUND Right 07/22/2019   Procedure: IRRIGATION AND DEBRIDEMENT RIGHT FOOT; APPLICATION OF WOUND VAC;  Surgeon: Evelina Bucy, DPM;  Location: [facility];  Service: Podiatry;  Laterality: Right;   INCISION AND  DRAINAGE OF WOUND Right 07/24/2019   Procedure: IRRIGATION AND DEBRIDEMENT WOUND, APPLICATION OF WOUND VAC;  Surgeon: Evelina Bucy, DPM;  Location: [facility];  Service: Podiatry;  Laterality: Right;   INCISION AND DRAINAGE OF WOUND Right 05/29/2020   Procedure: RIGHT FOOT WOUND DEBRIDEMENT AND IRRIGATION;  Surgeon: Evelina Bucy, DPM;  Location: [place];  Service: Podiatry;  Laterality: Right;   INCISION AND DRAINAGE OF WOUND Right 04/13/2022   Procedure: IRRIGATION AND DEBRIDEMENT WOUND;  Surgeon: Felipa Furnace, DPM;  Location: [place];  Service: Podiatry;  Laterality: Right;   IRRIGATION AND DEBRIDEMENT FOOT Right 04/10/2022   Procedure: IRRIGATION AND DEBRIDEMENT FOOT;  Surgeon: [name], DPM;  Location: [place];  Service: Podiatry;  Laterality: Right;   LUMBAR DISC SURGERY  07-07-2005   @[facility]    L5 - S1   METATARSAL HEAD EXCISION Bilateral 01/02/2019   Procedure: Left foot 5th metatarsal resection, wound excision, adjacent tissue transfer. Right fifth metatarsal head resection, fitfth metatarsal base resection, intermediate wound repair.;  Surgeon: Evelina Bucy, DPM;  Location: [facility];  Service: Podiatry;  Laterality: Bilateral;   METATARSAL HEAD EXCISION Right 05/12/2021   Procedure: Right 5th met base resection;  Surgeon: Evelina Bucy, DPM;  Location[facility];  Service: Podiatry;  Laterality: Right;   METATARSAL OSTEOTOMY Right 07/05/2019   Procedure: RIGHT FOOT 5TH METATARSAL RESECTION; PERONEAL TENDONESIS, EXCISION OF ULCER, LOCAL TISSUE TRANSFER ;  Surgeon: Evelina Bucy, DPM;  Location: [place];  Service: Podiatry;  Laterality: Right;   ORIF METATARSAL FRACTURE  09-22-2015  @[facility]    right 5th   TOTAL ABDOMINAL HYSTERECTOMY W/ BILATERAL SALPINGOOPHORECTOMY  2000 approx.    Social History:   reports that she has been smoking cigarettes. She has a 10.00 pack-year smoking history. She has never used smokeless tobacco. She reports that she does not  drink alcohol and does not use drugs.  Allergies  Allergen Reactions   Cephalexin Nausea And Vomiting   Demerol  [Meperidine Hcl] Rash   Lovastatin Other (See Comments)    Possible myalgia    Metformin And Related Nausea And Vomiting   Sulfamethoxazole-Trimethoprim     Other reaction(s): Unknown DILI, pancreatitis   Crestor [Rosuvastatin Calcium]     myalgia   Hydromorphone     Other reaction(s): Unknown   Niacin Other (See Comments)    Unknown    Other Other (See Comments)   Hydromorphone Hcl Itching    Patient has been tolerating Hydromorphone tablets without adverse effect (07/09/19) Other reaction(s): Unknown   Paroxetine Other (See Comments)    Unknown     Family History  Problem Relation Age of Onset   Diabetes Mother    Hypertension Mother  Cancer Father 28       ? bone cancer   [name] Brother      Prior to Admission medications   Medication Sig Start Date End Date Taking? Authorizing Provider  albuterol (VENTOLIN HFA) 108 (90 Base) MCG/ACT inhaler  08/12/22   [provider]  aspirin 325 MG tablet 325 mg daily.    [provider]  azithromycin (ZITHROMAX) 500 MG tablet Take 500 mg by mouth daily. 08/12/22   [provider]  B-D INS SYR ULTRAFINE 1CC/31G 31G X 5/16" 1 ML MISC USE AS DIRECTED 09/08/16   Renato Shin, MD  baclofen (LIORESAL) 10 MG tablet TAKE 2 TABLETS BY MOUTH TWICE  DAILY 09/28/22   Plotnikov, [name], MD  bisacodyl (DULCOLAX) 5 MG EC tablet Take 10 mg by mouth at bedtime.    [provider]  Blood Glucose Monitoring Suppl (ONETOUCH VERIO) w/Device KIT  11/28/18   [provider]  cholecalciferol (VITAMIN D3) 25 MCG (1000 UNIT) tablet Take 1,000 Units by mouth daily.    [provider]  clonazePAM (KLONOPIN) 0.5 MG tablet Take 1 tablet (0.5 mg total) by mouth 2 (two) times daily as needed for up to 5 days for anxiety. 05/09/22 05/14/22  Plotnikov, [name], MD  colchicine 0.6 MG  tablet TAKE 2 TABLETS AS NEEDED GOUT ATTACK. THEN TAKE ANOTHER 1 TAB IN 1-2 HOURS. DONT REPEAT FOR 3 DAYS. 01/21/23   Plotnikov, [name], MD  collagenase (SANTYL) 250 UNIT/GM ointment APPLY 1 APPLICATION TOPICALLY DAILY 12/28/22   Felipa Furnace, DPM  Continuous Blood Gluc Sensor (FREESTYLE LIBRE 2 SENSOR) MISC as directed subcutaneous every 14 days    [provider]  docusate sodium (COLACE) 100 MG capsule Take 100 mg by mouth at bedtime.    [provider]  doxycycline (VIBRA-TABS) 100 MG tablet TAKE 1 TABLET BY MOUTH TWICE A DAY 12/28/22   Felipa Furnace, DPM  ezetimibe (ZETIA) 10 MG tablet TAKE 1 TABLET BY MOUTH DAILY 12/22/22   Plotnikov, [name], MD  FLUoxetine (PROZAC) 10 MG capsule TAKE 1 CAPSULE BY MOUTH EVERY DAY 12/29/22   Plotnikov, [name], MD  gabapentin (NEURONTIN) 800 MG tablet TAKE 1 TABLET BY MOUTH 4 TIMES  DAILY 12/05/22   Plotnikov, [name], MD  insulin NPH-regular Human (70-30) 100 UNIT/ML injection Inject 20 Units into the skin 2 (two) times daily with a meal. Patient taking differently: Inject 30-40 Units into the skin See admin instructions. 30 units in the morning and afternoon and 40 units at night 11/29/21   Eulogio Bear U, DO  lamoTRIgine (LAMICTAL) 25 MG tablet TAKE 1 TABLET BY MOUTH TWICE  DAILY 09/08/22   Plotnikov, [name], MD  Lancet Devices (ACCU-CHEK [facility]) lancets Test two times daily 07/01/16   Renato Shin, MD  Lancets (ONETOUCH DELICA PLUS [id number]) [place]  11/07/19   [provider]  levothyroxine (SYNTHROID) 75 MCG tablet TAKE 1 TABLET BY MOUTH DAILY  BEFORE BREAKFAST 12/05/22   Plotnikov, [name], MD  lisinopril (ZESTRIL) 10 MG tablet TAKE 1 TABLET BY MOUTH DAILY 12/21/22   Plotnikov, [name], MD  loratadine (CLARITIN) 10 MG tablet Take 1 tablet (10 mg total) by mouth daily as needed for allergies. 04/21/20   Plotnikov, [name], MD  ondansetron (ZOFRAN) 4 MG tablet Take 4 mg by mouth every 8 (eight) hours as needed for nausea or  vomiting.    [provider]  [facility] VERIO test strip 1 each 3 (three)  times daily.    [provider]  oxyCODONE (ROXICODONE) 15 MG immediate release tablet Take 1 tablet (15 mg total) by mouth every 6 (six) hours as needed for pain. 11/16/22   Plotnikov, [name], MD  oxyCODONE (ROXICODONE) 15 MG immediate release tablet Take 1 tablet (15 mg total) by mouth every 6 (six) hours as needed for pain. 11/16/22   Plotnikov, [name], MD  rOPINIRole (REQUIP) 0.5 MG tablet TAKE 1 TABLET BY MOUTH AT  BEDTIME 12/05/22   Plotnikov, [name], MD  silver sulfADIAZINE (SILVADENE) 1 % cream APPLY PEA-SIZED AMOUNT TO WOUND DAILY. 12/01/21   Evelina Bucy, DPM  sodium hypochlorite (DAKIN'S 1/2 STRENGTH) external solution Irrigate with 1 Application as directed daily. 12/28/22   Felipa Furnace, DPM  vitamin B-12 (CYANOCOBALAMIN) 1000 MCG tablet Take 1,000 mcg by mouth daily.    [provider]  zolpidem (AMBIEN) 10 MG tablet 10 mg at bedtime as needed for sleep.    [provider]    Physical Exam: Vitals:   01/22/23 2203 01/23/23 0136 01/23/23 0226  BP: (!) 154/92 134/71 126/78  Pulse: 90 78 77  Resp: 18 14 15   Temp: 98 F (36.7 C) 98.2 F (36.8 C) (!) 97.4 F (36.3 C)  TempSrc: Oral Oral Oral  SpO2: 95% 96% 95%    Constitutional: NAD, no pallor or diaphoresis  Eyes: PERTLA, lids and conjunctivae normal ENMT: Mucous membranes are moist. Posterior pharynx clear of any exudate or lesions.   Neck: supple, no masses  Respiratory: clear to auscultation bilaterally, no wheezing, no crackles. No accessory muscle use.  Cardiovascular: S1 & S2 heard, regular rate and rhythm. No JVD. Abdomen: No distension, no tenderness, soft. Bowel sounds active.  Musculoskeletal: no clubbing / cyanosis. No joint deformity upper and lower extremities.   Skin: Erythema, heat, edema, and tenderness involving lower right leg. Right foot ulcer without drainage. Skin otherwise warm, dry,  well-perfused. Neurologic: CN 2-12 grossly intact. Moving all extremities. Alert and oriented.  Psychiatric: Calm. Cooperative.    Labs and Imaging on Admission: I have personally reviewed following labs and imaging studies  CBC: Recent Labs  Lab 01/22/23 2209  WBC 9.8  NEUTROABS 5.7  HGB 14.4  HCT 44.0  MCV 87.8  PLT 123456   Basic Metabolic Panel: Recent Labs  Lab 01/22/23 2209  NA 133*  K 4.7  CL 96*  CO2 25  GLUCOSE 333*  BUN 21*  CREATININE 1.27*  CALCIUM 8.8*   GFR: CrCl cannot be calculated (Unknown ideal weight.). Liver Function Tests: Recent Labs  Lab 01/22/23 2209  AST 17  ALT 16  ALKPHOS 109  BILITOT 0.2*  PROT 7.6  ALBUMIN 3.2*   No results for input(s): "LIPASE", "AMYLASE" in the last 168 hours. No results for input(s): "AMMONIA" in the last 168 hours. Coagulation Profile: No results for input(s): "INR", "PROTIME" in the last 168 hours. Cardiac Enzymes: No results for input(s): "CKTOTAL", "CKMB", "CKMBINDEX", "TROPONINI" in the last 168 hours. BNP (last 3 results) No results for input(s): "PROBNP" in the last 8760 hours. HbA1C: No results for input(s): "HGBA1C" in the last 72 hours. CBG: No results for input(s): "GLUCAP" in the last 168 hours. Lipid Profile: No results for input(s): "CHOL", "HDL", "LDLCALC", "TRIG", "CHOLHDL", "LDLDIRECT" in the last 72 hours. Thyroid Function Tests: No results for input(s): "TSH", "T4TOTAL", "FREET4", "T3FREE", "THYROIDAB" in the last 72 hours. Anemia Panel: No results for input(s): "VITAMINB12", "FOLATE", "FERRITIN", "TIBC", "IRON", "RETICCTPCT" in the last 72  hours. Urine analysis:    Component Value Date/Time   COLORURINE STRAW (A) 04/14/2022 1131   APPEARANCEUR CLEAR 04/14/2022 1131   LABSPEC 1.006 04/14/2022 1131   PHURINE 5.0 04/14/2022 1131   GLUCOSEU NEGATIVE 04/14/2022 [place] 02/23/2022 1546   HGBUR NEGATIVE 04/14/2022 [place] 04/14/2022 1131   KETONESUR  NEGATIVE 04/14/2022 1131   PROTEINUR NEGATIVE 04/14/2022 1131   UROBILINOGEN 1.0 02/23/2022 1546   NITRITE NEGATIVE 04/14/2022 1131   LEUKOCYTESUR NEGATIVE 04/14/2022 1131   Sepsis Labs: @LABRCNTIP (procalcitonin:4,lacticidven:4) )No results found for this or any previous visit (from the past 240 hour(s)).   Radiological Exams on Admission: DG Tibia/Fibula Right  Result Date: 01/22/2023 CLINICAL DATA:  Ulcer and infection EXAM: RIGHT TIBIA AND FIBULA - 2 VIEW COMPARISON:  None Available. FINDINGS: Diffuse bone demineralization. No evidence of acute fracture or dislocation. No focal bone lesion or bone erosion. Soft tissues are unremarkable. IMPRESSION: No acute bony abnormalities. Electronically Signed   By: Lucienne Capers M.D.   On: 01/22/2023 22:47   DG Foot Complete Right  Result Date: 01/22/2023 CLINICAL DATA:  Ulcer and infection. EXAM: RIGHT FOOT COMPLETE - 3+ VIEW COMPARISON:  04/08/2022 FINDINGS: Diffuse bone demineralization. Previous resection or resorption of the distal right fifth metatarsal bone, unchanged. Bone erosion and sclerosis involving the base of the fifth metatarsal in the adjacent cuboidal bone likely representing osteomyelitis. Appearance is similar to prior study. No new areas of bone resorption or appreciated. Degenerative changes in the interphalangeal and intertarsal joints. No radiopaque soft tissue foreign bodies or soft tissue gas. IMPRESSION: Changes of chronic osteomyelitis suggested at the base of the fifth metatarsal and adjacent cuboidal bone similar to prior study. Soft tissue changes are improving since the prior study. Electronically Signed   By: Lucienne Capers M.D.   On: 01/22/2023 22:46     Assessment/Plan   1. Right lower leg cellulitis; chronic right foot ulcer; chronic right foot osteomyelitis  - Not septic on admission; no gas or evidence for acute osteomyelitis on plain radiographs in ED  - She had normal ABI on the right with no evidence for  arterial disease one year ago  - Blood culture collected in ED, empiric antibiotics started, and MRI left foot ordered  - Outline area of involvement, continue broad-spectrum antibiotics, follow-up MRI findings    2. AKI  - SCr is 1.27 on admission, up from 0.96  - Hold lisinopril, hydrate with IVF, repeat chem panel in am    3. Insulin-dependent DM  - A1c was 7.5% in June 2023  - Check CBGs, continue insulin   4. Anxiety  - Continue Prozac, Xanax as needed   5. Chronic pain  - Prescription database reviewed, plan to continue home regimen    6. Hypothyroidism  - Continue Synthroid    DVT prophylaxis: SCDs  Code Status: Full  Level of Care: Level of care: Med-Surg Family Communication: None present  Disposition Plan:  Patient is from: home  Anticipated d/c is to: Home  Anticipated d/c date is: 01/26/23  Patient currently: Pending improvement in cellulitis and transition to oral antibiotic  Consults called: none  Admission status: Inpatient     Vianne Bulls, MD [facility]  01/23/2023, 4:46 AM

## 2023-01-23 NOTE — ED Provider Notes (Signed)
[facility] Provider Note   CSN: [id number] Arrival date & time: 01/22/23  2136     History  Chief Complaint  Patient presents with   Foot Pain    Katherine Herrera is a 57 y.o. female.  57 year old female with a history of diabetes, hyperlipidemia, COPD, hypothyroid, chronic right foot wound presents to the emergency department secondary to concern for acute infection to her right lower extremity.  She states that she has noticed increased redness and warmth since Friday, worsening today.  Has started using Hysept for her wound without change. Is also presently on doxycycline.   She is followed by Dr. Posey Pronto of the [facility] and was planned to have a peroneal longus to brevis transfer with posterior tibial tendon lengthening with Achilles tendon lengthening in the near future.  The history is provided by the patient. No language interpreter was used.  Foot Pain       Home Medications Prior to Admission medications   Medication Sig Start Date End Date Taking? Authorizing Provider  albuterol (VENTOLIN HFA) 108 (90 Base) MCG/ACT inhaler  08/12/22   [provider]  aspirin 325 MG tablet 325 mg daily.    [provider]  azithromycin (ZITHROMAX) 500 MG tablet Take 500 mg by mouth daily. 08/12/22   [provider]  B-D INS SYR ULTRAFINE 1CC/31G 31G X 5/16" 1 ML MISC USE AS DIRECTED 09/08/16   Renato Shin, MD  baclofen (LIORESAL) 10 MG tablet TAKE 2 TABLETS BY MOUTH TWICE  DAILY 09/28/22   Plotnikov, [name], MD  bisacodyl (DULCOLAX) 5 MG EC tablet Take 10 mg by mouth at bedtime.    [provider]  Blood Glucose Monitoring Suppl (ONETOUCH VERIO) w/Device KIT  11/28/18   [provider]  cholecalciferol (VITAMIN D3) 25 MCG (1000 UNIT) tablet Take 1,000 Units by mouth daily.    [provider]  clonazePAM (KLONOPIN) 0.5 MG tablet Take 1 tablet (0.5 mg total) by mouth 2  (two) times daily as needed for up to 5 days for anxiety. 05/09/22 05/14/22  Plotnikov, [name], MD  colchicine 0.6 MG tablet TAKE 2 TABLETS AS NEEDED GOUT ATTACK. THEN TAKE ANOTHER 1 TAB IN 1-2 HOURS. DONT REPEAT FOR 3 DAYS. 01/21/23   Plotnikov, [name], MD  collagenase (SANTYL) 250 UNIT/GM ointment APPLY 1 APPLICATION TOPICALLY DAILY 12/28/22   Felipa Furnace, DPM  Continuous Blood Gluc Sensor (FREESTYLE LIBRE 2 SENSOR) MISC as directed subcutaneous every 14 days    [provider]  docusate sodium (COLACE) 100 MG capsule Take 100 mg by mouth at bedtime.    [provider]  doxycycline (VIBRA-TABS) 100 MG tablet TAKE 1 TABLET BY MOUTH TWICE A DAY 12/28/22   Felipa Furnace, DPM  ezetimibe (ZETIA) 10 MG tablet TAKE 1 TABLET BY MOUTH DAILY 12/22/22   Plotnikov, [name], MD  FLUoxetine (PROZAC) 10 MG capsule TAKE 1 CAPSULE BY MOUTH EVERY DAY 12/29/22   Plotnikov, [name], MD  gabapentin (NEURONTIN) 800 MG tablet TAKE 1 TABLET BY MOUTH 4 TIMES  DAILY 12/05/22   Plotnikov, [name], MD  insulin NPH-regular Human (70-30) 100 UNIT/ML injection Inject 20 Units into the skin 2 (two) times daily with a meal. Patient taking differently: Inject 30-40 Units into the skin See admin instructions. 30 units in the morning and afternoon and 40 units at night 11/29/21   Eulogio Bear U, DO  lamoTRIgine (LAMICTAL) 25 MG  tablet TAKE 1 TABLET BY MOUTH TWICE  DAILY 09/08/22   Plotnikov, [name], MD  Lancet Devices (ACCU-CHEK [facility]) lancets Test two times daily 07/01/16   Renato Shin, MD  Lancets (ONETOUCH DELICA PLUS [id number]) [place]  11/07/19   [provider]  levothyroxine (SYNTHROID) 75 MCG tablet TAKE 1 TABLET BY MOUTH DAILY  BEFORE BREAKFAST 12/05/22   Plotnikov, [name], MD  lisinopril (ZESTRIL) 10 MG tablet TAKE 1 TABLET BY MOUTH DAILY 12/21/22   Plotnikov, [name], MD  loratadine (CLARITIN) 10 MG tablet Take 1 tablet (10 mg total) by mouth daily as needed for allergies. 04/21/20    Plotnikov, [name], MD  ondansetron (ZOFRAN) 4 MG tablet Take 4 mg by mouth every 8 (eight) hours as needed for nausea or vomiting.    [provider]  [facility] VERIO test strip 1 each 3 (three) times daily.    [provider]  oxyCODONE (ROXICODONE) 15 MG immediate release tablet Take 1 tablet (15 mg total) by mouth every 6 (six) hours as needed for pain. 11/16/22   Plotnikov, [name], MD  oxyCODONE (ROXICODONE) 15 MG immediate release tablet Take 1 tablet (15 mg total) by mouth every 6 (six) hours as needed for pain. 11/16/22   Plotnikov, [name], MD  rOPINIRole (REQUIP) 0.5 MG tablet TAKE 1 TABLET BY MOUTH AT  BEDTIME 12/05/22   Plotnikov, [name], MD  silver sulfADIAZINE (SILVADENE) 1 % cream APPLY PEA-SIZED AMOUNT TO WOUND DAILY. 12/01/21   Evelina Bucy, DPM  sodium hypochlorite (DAKIN'S 1/2 STRENGTH) external solution Irrigate with 1 Application as directed daily. 12/28/22   Felipa Furnace, DPM  vitamin B-12 (CYANOCOBALAMIN) 1000 MCG tablet Take 1,000 mcg by mouth daily.    [provider]  zolpidem (AMBIEN) 10 MG tablet 10 mg at bedtime as needed for sleep.    [provider]      Allergies    Cephalexin, Demerol  [meperidine hcl], Lovastatin, Metformin and related, Sulfamethoxazole-trimethoprim, Crestor [rosuvastatin calcium], Hydromorphone, Niacin, Other, Hydromorphone hcl, and Paroxetine    Review of Systems   Review of Systems Ten systems reviewed and are negative for acute change, except as noted in the HPI.    Physical Exam Updated Vital Signs BP 126/78 (BP Location: Right Arm)   Pulse 77   Temp (!) 97.4 F (36.3 C) (Oral)   Resp 15   SpO2 95%   Physical Exam Vitals and nursing note reviewed.  Constitutional:      General: She is not in acute distress.    Appearance: She is well-developed. She is not diaphoretic.     Comments: Nontoxic appearing and in NAD  HENT:     Head: Normocephalic and atraumatic.  Eyes:     General: No  scleral icterus.    Conjunctiva/sclera: Conjunctivae normal.  Cardiovascular:     Rate and Rhythm: Normal rate and regular rhythm.     Pulses: Normal pulses.     Comments: DP pulse 2+ in the RLE Pulmonary:     Effort: Pulmonary effort is normal. No respiratory distress.  Musculoskeletal:        General: Normal range of motion.     Cervical back: Normal range of motion.     Comments: Ulceration of the lateral aspect of the R foot, mostly on plantar surface. See image below.  Skin:    General: Skin is warm and dry.     Coloration: Skin is not pale.     Findings: Erythema present.  No rash.     Comments: Erythema with warmth to the RLE, mostly anterior. Some lymphangitic streaking noted. No crepitus. Compartments of the RLE are soft, compressible.  Neurological:     Mental Status: She is alert and oriented to person, place, and time.  Psychiatric:        Behavior: Behavior normal.        ED Results / Procedures / Treatments   Labs (all labs ordered are listed, but only abnormal results are displayed) Labs Reviewed  COMPREHENSIVE METABOLIC PANEL - Abnormal; Notable for the following components:      Result Value   Sodium 133 (*)    Chloride 96 (*)    Glucose, Bld 333 (*)    BUN 21 (*)    Creatinine, Ser 1.27 (*)    Calcium 8.8 (*)    Albumin 3.2 (*)    Total Bilirubin 0.2 (*)    GFR, Estimated 49 (*)    All other components within normal limits  CBG MONITORING, ED - Abnormal; Notable for the following components:   Glucose-Capillary 249 (*)    All other components within normal limits  CULTURE, BLOOD (ROUTINE X 2)  CULTURE, BLOOD (ROUTINE X 2)  LACTIC ACID, PLASMA  LACTIC ACID, PLASMA  CBC WITH DIFFERENTIAL/PLATELET  HEMOGLOBIN A1C  SEDIMENTATION RATE  C-REACTIVE PROTEIN  PREALBUMIN  BASIC METABOLIC PANEL  CBC    EKG None  Radiology DG Tibia/Fibula Right  Result Date: 01/22/2023 CLINICAL DATA:  Ulcer and infection EXAM: RIGHT TIBIA AND FIBULA - 2 VIEW  COMPARISON:  None Available. FINDINGS: Diffuse bone demineralization. No evidence of acute fracture or dislocation. No focal bone lesion or bone erosion. Soft tissues are unremarkable. IMPRESSION: No acute bony abnormalities. Electronically Signed   By: Lucienne Capers M.D.   On: 01/22/2023 22:47   DG Foot Complete Right  Result Date: 01/22/2023 CLINICAL DATA:  Ulcer and infection. EXAM: RIGHT FOOT COMPLETE - 3+ VIEW COMPARISON:  04/08/2022 FINDINGS: Diffuse bone demineralization. Previous resection or resorption of the distal right fifth metatarsal bone, unchanged. Bone erosion and sclerosis involving the base of the fifth metatarsal in the adjacent cuboidal bone likely representing osteomyelitis. Appearance is similar to prior study. No new areas of bone resorption or appreciated. Degenerative changes in the interphalangeal and intertarsal joints. No radiopaque soft tissue foreign bodies or soft tissue gas. IMPRESSION: Changes of chronic osteomyelitis suggested at the base of the fifth metatarsal and adjacent cuboidal bone similar to prior study. Soft tissue changes are improving since the prior study. Electronically Signed   By: Lucienne Capers M.D.   On: 01/22/2023 22:46    Procedures Procedures    Medications Ordered in ED Medications  oxyCODONE (Oxy IR/ROXICODONE) immediate release tablet 15 mg (15 mg Oral Given 01/23/23 0354)  vancomycin (VANCOREADY) IVPB 1500 mg/300 mL (1,500 mg Intravenous New Bag/Given 01/23/23 0458)  FLUoxetine (PROZAC) capsule 10 mg (has no administration in time range)  levothyroxine (SYNTHROID) tablet 75 mcg (has no administration in time range)  clonazePAM (KLONOPIN) tablet 0.5 mg (has no administration in time range)  gabapentin (NEURONTIN) capsule 800 mg (has no administration in time range)  lamoTRIgine (LAMICTAL) tablet 25 mg (has no administration in time range)  rOPINIRole (REQUIP) tablet 0.5 mg (has no administration in time range)  insulin aspart (novoLOG)  injection 0-6 Units (has no administration in time range)  insulin glargine-yfgn (SEMGLEE) injection 20 Units (has no administration in time range)  acetaminophen (TYLENOL) tablet 650 mg (has no administration in time  range)    Or  acetaminophen (TYLENOL) suppository 650 mg (has no administration in time range)  0.9 %  sodium chloride infusion (has no administration in time range)  senna-docusate (Senokot-S) tablet 1 tablet (has no administration in time range)  ondansetron (ZOFRAN) tablet 4 mg (has no administration in time range)    Or  ondansetron (ZOFRAN) injection 4 mg (has no administration in time range)  metroNIDAZOLE (FLAGYL) tablet 500 mg (has no administration in time range)  vancomycin (VANCOREADY) IVPB 1250 mg/250 mL (has no administration in time range)  ceFEPIme (MAXIPIME) 2 g in sodium chloride 0.9 % 100 mL IVPB (has no administration in time range)  ceFEPIme (MAXIPIME) 2 g in sodium chloride 0.9 % 100 mL IVPB (0 g Intravenous Stopped 01/23/23 0425)    ED Course/ Medical Decision Making/ A&P                             Medical Decision Making Amount and/or Complexity of Data Reviewed Labs: ordered.  Risk Prescription drug management. Decision regarding hospitalization.   This patient presents to the ED for concern of RLE erythema, this involves an extensive number of treatment options, and is a complaint that carries with it a high risk of complications and morbidity.  The differential diagnosis includes cellulitis vs acute on chronic osteomyelitis vs necrotizing infection   Co morbidities that complicate the patient evaluation  DM HLD COPD   Additional history obtained:  External records from outside source obtained and reviewed including reviewed outpatient podiatry note from 1 month prior.   Lab Tests:  I Ordered, and personally interpreted labs.  The pertinent results include:  CBG 249, Creatinine 1.27 (0.96 in June 2023). Normal CBC and lactate  x2.   Imaging Studies ordered:  I ordered imaging studies including Xray R foot and tib/fib  I independently visualized and interpreted imaging which showed no acute traumatic pathology or other bony abnormality I agree with the radiologist interpretation   Cardiac Monitoring:  The patient was maintained on a cardiac monitor.  I personally viewed and interpreted the cardiac monitored which showed an underlying rhythm of: NSR   Medicines ordered and prescription drug management:  I ordered medication including Vancomycin and Cefepime for treatment of presumed cellulitis. Oxycodone ordered for pain. Reevaluation of the patient after these medicines showed that the patient stayed the same I have reviewed the patients home medicines and have made adjustments as needed   Test Considered:  ESR CRP   Critical Interventions:  IV abx   Consultations Obtained:  I requested consultation with the hospitalist and discussed lab and imaging findings as well as pertinent plan - Dr. Myna Hidalgo to assess in the ED for admission.   Problem List / ED Course:  As above   Reevaluation:  After the interventions noted above, I reevaluated the patient and found that they have :stayed the same   Dispostion:  After consideration of the diagnostic results and the patients response to treatment, I feel that the patent would benefit from admission for ongoing IV abx given failed outpatient management. Podiatry note suggest plan for consult in AM. Dr. Myna Hidalgo of [facility] to admit.         Final Clinical Impression(s) / ED Diagnoses Final diagnoses:  Cellulitis of right lower extremity    Rx / DC Orders ED Discharge Orders     None         Antonietta Breach, PA-C 01/23/23 0518  Merryl Hacker, MD 01/24/23 [phone]

## 2023-01-24 DIAGNOSIS — L03115 Cellulitis of right lower limb: Secondary | ICD-10-CM | POA: Diagnosis not present

## 2023-01-24 LAB — BASIC METABOLIC PANEL
Anion gap: 7 (ref 5–15)
BUN: 22 mg/dL — ABNORMAL HIGH (ref 6–20)
CO2: 27 mmol/L (ref 22–32)
Calcium: 8.5 mg/dL — ABNORMAL LOW (ref 8.9–10.3)
Chloride: 99 mmol/L (ref 98–111)
Creatinine, Ser: 1.04 mg/dL — ABNORMAL HIGH (ref 0.44–1.00)
GFR, Estimated: >60 mL/min (ref >60)
Glucose, Bld: 123 mg/dL — ABNORMAL HIGH (ref 70–99)
Potassium: 4.7 mmol/L (ref 3.5–5.1)
Sodium: 133 mmol/L — ABNORMAL LOW (ref 135–145)

## 2023-01-24 LAB — CBC
HCT: 37.3 % (ref 36.0–46.0)
Hemoglobin: 12.1 g/dL (ref 12.0–15.0)
MCH: 28.3 pg (ref 26.0–34.0)
MCHC: 32.4 g/dL (ref 30.0–36.0)
MCV: 87.1 fL (ref 80.0–100.0)
Platelets: 226 10*3/uL (ref 150–400)
RBC: 4.28 MIL/uL (ref 3.87–5.11)
RDW: 14.6 % (ref 11.5–15.5)
WBC: 7.8 10*3/uL (ref 4.0–10.5)
nRBC: 0 % (ref 0.0–0.2)

## 2023-01-24 LAB — SEDIMENTATION RATE: Sed Rate: 42 mm/hr — ABNORMAL HIGH (ref 0–22)

## 2023-01-24 LAB — GLUCOSE, CAPILLARY
Glucose-Capillary: 132 mg/dL — ABNORMAL HIGH (ref 70–99)
Glucose-Capillary: 137 mg/dL — ABNORMAL HIGH (ref 70–99)
Glucose-Capillary: 146 mg/dL — ABNORMAL HIGH (ref 70–99)
Glucose-Capillary: 172 mg/dL — ABNORMAL HIGH (ref 70–99)
Glucose-Capillary: 190 mg/dL — ABNORMAL HIGH (ref 70–99)
Glucose-Capillary: 253 mg/dL — ABNORMAL HIGH (ref 70–99)
Glucose-Capillary: 262 mg/dL — ABNORMAL HIGH (ref 70–99)

## 2023-01-24 LAB — C-REACTIVE PROTEIN: CRP: 1.3 mg/dL — ABNORMAL HIGH (ref <1.0)

## 2023-01-24 LAB — HEMOGLOBIN A1C
Hgb A1c MFr Bld: 10.6 % — ABNORMAL HIGH (ref 4.8–5.6)
Mean Plasma Glucose: 258 mg/dL

## 2023-01-24 MED ORDER — GLUCERNA SHAKE PO LIQD
237.0000 mL | Freq: Three times a day (TID) | ORAL | Status: DC
  Administered 2023-01-25 (×2): 237 mL via ORAL

## 2023-01-24 MED ORDER — ENOXAPARIN SODIUM 40 MG/0.4ML IJ SOSY
40.0000 mg | PREFILLED_SYRINGE | Freq: Every day | INTRAMUSCULAR | Status: DC
  Administered 2023-01-24 – 2023-01-25 (×2): 40 mg via SUBCUTANEOUS
  Filled 2023-01-24 (×2): qty 0.4

## 2023-01-24 MED ORDER — ADULT MULTIVITAMIN W/MINERALS CH
1.0000 | ORAL_TABLET | Freq: Every day | ORAL | Status: DC
  Administered 2023-01-24 – 2023-01-25 (×2): 1 via ORAL
  Filled 2023-01-24 (×2): qty 1

## 2023-01-24 NOTE — Evaluation (Signed)
Physical Therapy Evaluation and Discharge Patient Details Name: Katherine Herrera MRN: [id number] DOB: August 19, 1966 Today's Date: 01/24/2023  History of Present Illness  57 y.o. female admitted 3/24 with Right lower leg cellulitis; chronic right foot ulcer; chronic right foot osteomyelitis.  PMHx:  insulin-dependent diabetes mellitus, chronic pain, anxiety, hypertension, hypothyroidism, and chronic right foot ulcer followed by podiatry.  Clinical Impression  Patient evaluated by Physical Therapy with no further acute PT needs identified. All education has been completed and the patient has no further questions. Feels she is back to baseline with mobility. Educated on minimizing WB and following wound clinic/podiatry recommendations for adequate healing. Pt ambulates at mod I level, has equipment at home that she uses intermittently, mostly SPC but discussed RW being a good device for reduced Rt foot load. All questions answered. See below for any follow-up Physical Therapy or equipment needs. PT is signing off. Thank you for this referral.        Recommendations for follow up therapy are one component of a multi-disciplinary discharge planning process, led by the attending physician.  Recommendations may be updated based on patient status, additional functional criteria and insurance authorization.  Follow Up Recommendations   Continue going to wound clinic     Assistance Recommended at Discharge PRN     Equipment Recommendations BSC/3in1 (States her prior device is not safe. May benefit from 3 in 1 use for ADLs hygiene.)  Recommendations for Other Services       Functional Status Assessment Patient has not had a recent decline in their functional status     Precautions / Restrictions Precautions Precautions: Fall Restrictions Weight Bearing Restrictions: No      Mobility  Bed Mobility Overal bed mobility: Independent                  Transfers Overall transfer level:  Modified independent                 General transfer comment: No physical assist needed    Ambulation/Gait Ambulation/Gait assistance: Modified independent (Device/Increase time) Gait Distance (Feet): 105 Feet Assistive device: None, IV Pole Gait Pattern/deviations: Antalgic Gait velocity: decr Gait velocity interpretation: 1.31 - 2.62 ft/sec, indicative of limited community ambulator   General Gait Details: Mildly antalgic gait pattern, initially with IV pole, progressed to unsupported gait. No evidence of LOB or buckling. Slower gait speed.  Stairs            Wheelchair Mobility    Modified Rankin (Stroke Patients Only)       Balance Overall balance assessment: Mild deficits observed, not formally tested                                           Pertinent Vitals/Pain Pain Assessment Pain Assessment: Faces Faces Pain Scale: Hurts little more Pain Location: Rt foot Pain Descriptors / Indicators: Aching Pain Intervention(s): Monitored during session    Home Living Family/patient expects to be discharged to:: Private residence Living Arrangements: Alone Available Help at Discharge: Family;Friend(s);Available PRN/intermittently Type of Home: Apartment Home Access: Ramped entrance       Home Layout: One level Home Equipment: Rollator (4 wheels);Shower seat;Cane - single Barista (2 wheels)      Prior Function Prior Level of Function : Independent/Modified Independent             Mobility Comments: Uses cane primarily -  sometimes rollator. Daughter takes her to get groceries.       Hand Dominance   Dominant Hand: Right    Extremity/Trunk Assessment   Upper Extremity Assessment Upper Extremity Assessment: Defer to OT evaluation    Lower Extremity Assessment Lower Extremity Assessment: RLE deficits/detail RLE Deficits / Details: bandaged       Communication   Communication: No difficulties  Cognition  Arousal/Alertness: Awake/alert Behavior During Therapy: WFL for tasks assessed/performed Overall Cognitive Status: Within Functional Limits for tasks assessed                                          General Comments      Exercises     Assessment/Plan    PT Assessment Patient does not need any further PT services  PT Problem List         PT Treatment Interventions      PT Goals (Current goals can be found in the Care Plan section)  Acute Rehab PT Goals Patient Stated Goal: go home PT Goal Formulation: All assessment and education complete, DC therapy    Frequency       Co-evaluation               AM-PAC PT "6 Clicks" Mobility  Outcome Measure Help needed turning from your back to your side while in a flat bed without using bedrails?: None Help needed moving from lying on your back to sitting on the side of a flat bed without using bedrails?: None Help needed moving to and from a bed to a chair (including a wheelchair)?: None Help needed standing up from a chair using your arms (e.g., wheelchair or bedside chair)?: None Help needed to walk in hospital room?: None Help needed climbing 3-5 steps with a railing? : None 6 Click Score: 24    End of Session   Activity Tolerance: Patient tolerated treatment well Patient left: in bed;with call bell/phone within reach   PT Visit Diagnosis: Other abnormalities of gait and mobility (R26.89);Pain Pain - Right/Left: Right Pain - part of body: Ankle and joints of foot    Time: [id number] PT Time Calculation (min) (ACUTE ONLY): 12 min   Charges:   PT Evaluation $PT Eval Low Complexity: 1 Low          Candie Mile, PT, DPT Physical Therapist Acute Rehabilitation Services [place]   Ellouise Newer 01/24/2023, 4:12 PM

## 2023-01-24 NOTE — Progress Notes (Signed)
PROGRESS NOTE    Katherine Herrera  [id number] DOB: 07-May-1966 DOA: 01/22/2023 PCP: Cassandria Anger, MD  Outpatient Specialists: podiatry, endocrinology    Brief Narrative:   From admission h and p  [name] is a 57 y.o. female with medical history significant for insulin-dependent diabetes mellitus, chronic pain, anxiety, hypertension, hypothyroidism, and chronic right foot ulcer followed by podiatry who presents to the emergency department with worsening redness, pain, swelling, and heat traveling up her right leg.   Patient reports that she noticed redness, heat, and swelling involving her lower right leg the night of 01/20/2023.  Redness and swelling has progressed since then.  She called her podiatry clinic and was advised to seek evaluation in the emergency department.  Assessment & Plan:   Principal Problem:   Cellulitis of right leg Active Problems:   AKI (acute kidney injury) ([place])   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)   Essential hypertension   Hypothyroidism   Anxiety disorder   Right foot ulcer ([place])   1. Right lower leg cellulitis; chronic right foot ulcer; chronic right foot osteomyelitis  - Not septic on admission; no gas or evidence for acute osteomyelitis on plain radiographs in ED  - She had normal ABI on the right with no evidence for arterial disease one year ago  - Blood culture collected in ED, empiric antibiotics started, and MRI left foot ordered. Mri consistent w/ chronic osteo - symptomatically improving on vanc/cefepime flagy. Podiatry following, no plans for surgical intervention, advises one more day IV abx, will plan on discharge tomorrow with oral antibiotics to complete a course - pt/ot consult   2. AKI  - SCr is 1.27 on admission, up from 0.96, resolved today. Will d/c fluids   3. Insulin-dependent DM  - A1c was 7.5% in June 2023, now worsened to 10s. Patient is followed by endocrinology, saw them a week ago, is  aware needs better glucose control   4. Anxiety  - Continue Prozac, Xanax as needed    5. Chronic pain  - Prescription database reviewed, plan to continue home regimen     6. Hypothyroidism - home levothyroxine   DVT prophylaxis: lovenox Code Status: full Family Communication: none @ bedside  Level of care: Med-Surg Status is: Inpatient Remains inpatient appropriate because: need for IV abx    Consultants:  podiatry  Procedures: none  Antimicrobials:  Vanc/cefepime/flagyl    Subjective: Reports leg feels better, less red and swollen  Objective: Vitals:   01/24/23 0613 01/24/23 0624 01/24/23 0828 01/24/23 0925  BP: (!) 95/43 104/63 93/62 115/62  Pulse: 68 66 61   Resp:   19   Temp: 97.6 F (36.4 C)  98 F (36.7 C)   TempSrc:   Oral   SpO2: 92%  91%   Weight:      Height:        Intake/Output Summary (Last 24 hours) at 01/24/2023 1340 Last data filed at 01/24/2023 0600 Gross per 24 hour  Intake 1666.78 ml  Output --  Net 1666.78 ml   Filed Weights   01/23/23 1052  Weight: 91.2 kg    Examination:  General exam: Appears calm and comfortable  Respiratory system: Clear to auscultation. Respiratory effort normal. Cardiovascular system: S1 & S2 heard, RRR. No JVD, murmurs, rubs, gallops or clicks. No pedal edema. Gastrointestinal system: Abdomen is nondistended, soft and nontender. No organomegaly or masses felt. Normal bowel sounds heard. Central nervous system: Alert and oriented. No focal  neurological deficits. Extremities: Symmetric 5 x 5 power. Skin: marked area of erythema and induration RLE much improved from yesterday Psychiatry: Judgement and insight appear normal. Mood & affect appropriate.     Data Reviewed: I have personally reviewed following labs and imaging studies  CBC: Recent Labs  Lab 01/22/23 2209 01/23/23 0723 01/24/23 0100  WBC 9.8 8.4 7.8  NEUTROABS 5.7  --   --   HGB 14.4 12.2 12.1  HCT 44.0 38.2 37.3  MCV 87.8 88.4  87.1  PLT 255 230 [id number]   Basic Metabolic Panel: Recent Labs  Lab 01/22/23 2209 01/23/23 0723 01/24/23 0100  NA 133* 135 133*  K 4.7 4.1 4.7  CL 96* 103 99  CO2 25 25 27   GLUCOSE 333* 180* 123*  BUN 21* 20 22*  CREATININE 1.27* 1.07* 1.04*  CALCIUM 8.8* 8.3* 8.5*   GFR: Estimated Creatinine Clearance: 71.8 mL/min (A) (by C-G formula based on SCr of 1.04 mg/dL (H)). Liver Function Tests: Recent Labs  Lab 01/22/23 2209  AST 17  ALT 16  ALKPHOS 109  BILITOT 0.2*  PROT 7.6  ALBUMIN 3.2*   No results for input(s): "LIPASE", "AMYLASE" in the last 168 hours. No results for input(s): "AMMONIA" in the last 168 hours. Coagulation Profile: No results for input(s): "INR", "PROTIME" in the last 168 hours. Cardiac Enzymes: No results for input(s): "CKTOTAL", "CKMB", "CKMBINDEX", "TROPONINI" in the last 168 hours. BNP (last 3 results) No results for input(s): "PROBNP" in the last 8760 hours. HbA1C: Recent Labs    01/23/23 0724  HGBA1C 10.6*   CBG: Recent Labs  Lab 01/23/23 2004 01/24/23 0022 01/24/23 0612 01/24/23 0743 01/24/23 1208  GLUCAP 210* 132* 137* 146* 262*   Lipid Profile: No results for input(s): "CHOL", "HDL", "LDLCALC", "TRIG", "CHOLHDL", "LDLDIRECT" in the last 72 hours. Thyroid Function Tests: No results for input(s): "TSH", "T4TOTAL", "FREET4", "T3FREE", "THYROIDAB" in the last 72 hours. Anemia Panel: No results for input(s): "VITAMINB12", "FOLATE", "FERRITIN", "TIBC", "IRON", "RETICCTPCT" in the last 72 hours. Urine analysis:    Component Value Date/Time   COLORURINE STRAW (A) 04/14/2022 1131   APPEARANCEUR CLEAR 04/14/2022 1131   LABSPEC 1.006 04/14/2022 1131   PHURINE 5.0 04/14/2022 1131   GLUCOSEU NEGATIVE 04/14/2022 1131   GLUCOSEU NEGATIVE 02/23/2022 1546   HGBUR NEGATIVE 04/14/2022 1131   [place] 04/14/2022 1131   [place] 04/14/2022 1131   PROTEINUR NEGATIVE 04/14/2022 1131   UROBILINOGEN 1.0 02/23/2022 1546    NITRITE NEGATIVE 04/14/2022 1131   LEUKOCYTESUR NEGATIVE 04/14/2022 1131   Sepsis Labs: @LABRCNTIP (procalcitonin:4,lacticidven:4)  ) Recent Results (from the past 240 hour(s))  Blood culture (routine x 2)     Status: None (Preliminary result)   Collection Time: 01/23/23  3:12 AM   Specimen: BLOOD  Result Value Ref Range Status   Specimen Description BLOOD LEFT WRIST  Final   Special Requests   Final    BOTTLES DRAWN AEROBIC AND ANAEROBIC Blood Culture results may not be optimal due to an inadequate volume of blood received in culture bottles   Culture   Final    NO GROWTH 1 DAY Performed at [facility], [place] [address], [place], [place] [address]    Report Status PENDING  Incomplete         Radiology Studies: MR FOOT RIGHT W WO CONTRAST  Result Date: 01/23/2023 CLINICAL DATA:  Foot swelling, diabetic, osteomyelitis suspected, xray done lateral midfoot ulcer EXAM: MRI OF THE RIGHT FOREFOOT WITHOUT AND WITH CONTRAST TECHNIQUE:  Multiplanar, multisequence MR imaging of the right forefoot was performed before and after the administration of intravenous contrast. CONTRAST:  60mL GADAVIST GADOBUTROL 1 MMOL/ML IV SOLN COMPARISON:  Multiple prior right foot radiographs, most recently 01/22/2023, MRI 03/11/2022 FINDINGS: Bones/Joint/Cartilage Postsurgical changes of prior fifth metatarsal base resection. There are sclerotic changes within the residual base of fifth metatarsal with mild marrow edema and enhancement. There is also mild marrow edema and enhancement within the adjacent lateral aspect of the cuboid with chronic cortical erosion of the cuboid. Marrow signal abnormality is slightly worsened in comparison to prior MRI in May 2023. Ligaments Ankle ligaments are poorly evaluated due to scan obliquity. Muscles and Tendons Postsurgical changes of the peroneal brevis. Peroneal longus tendon appears intact. Soft tissues Lateral midfoot ulcer with adjacent skin thickening and underlying  soft tissue swelling. There is no organized fluid collection. IMPRESSION: Lateral midfoot ulcer with postsurgical changes of prior base of fifth metatarsal resection. Chronic osseous changes in the residual base of fifth metatarsal and lateral cuboid, with underlying marrow edema and mild enhancement suggesting reactive marrow change or early acute on chronic osteomyelitis. No evidence of soft tissue abscess. Electronically Signed   By: Maurine Simmering M.D.   On: 01/23/2023 15:48   DG Tibia/Fibula Right  Result Date: 01/22/2023 CLINICAL DATA:  Ulcer and infection EXAM: RIGHT TIBIA AND FIBULA - 2 VIEW COMPARISON:  None Available. FINDINGS: Diffuse bone demineralization. No evidence of acute fracture or dislocation. No focal bone lesion or bone erosion. Soft tissues are unremarkable. IMPRESSION: No acute bony abnormalities. Electronically Signed   By: Lucienne Capers M.D.   On: 01/22/2023 22:47   DG Foot Complete Right  Result Date: 01/22/2023 CLINICAL DATA:  Ulcer and infection. EXAM: RIGHT FOOT COMPLETE - 3+ VIEW COMPARISON:  04/08/2022 FINDINGS: Diffuse bone demineralization. Previous resection or resorption of the distal right fifth metatarsal bone, unchanged. Bone erosion and sclerosis involving the base of the fifth metatarsal in the adjacent cuboidal bone likely representing osteomyelitis. Appearance is similar to prior study. No new areas of bone resorption or appreciated. Degenerative changes in the interphalangeal and intertarsal joints. No radiopaque soft tissue foreign bodies or soft tissue gas. IMPRESSION: Changes of chronic osteomyelitis suggested at the base of the fifth metatarsal and adjacent cuboidal bone similar to prior study. Soft tissue changes are improving since the prior study. Electronically Signed   By: Lucienne Capers M.D.   On: 01/22/2023 22:46        Scheduled Meds:  ezetimibe  10 mg Oral Daily   FLUoxetine  10 mg Oral Daily   gabapentin  800 mg Oral TID AC & HS   insulin  aspart  0-15 Units Subcutaneous TID WC   insulin aspart  4 Units Subcutaneous TID WC   insulin glargine-yfgn  20 Units Subcutaneous BID   lamoTRIgine  25 mg Oral BID   levothyroxine  75 mcg Oral QAC breakfast   lisinopril  10 mg Oral Daily   metroNIDAZOLE  500 mg Oral Q12H   rOPINIRole  0.5 mg Oral QHS   Continuous Infusions:  ceFEPime (MAXIPIME) IV 2 g (01/24/23 1311)   vancomycin 1,250 mg (01/24/23 [id number])     LOS: 1 day     Desma Maxim, MD [facility]   If 7PM-7AM, please contact night-coverage [email or web address] Password [email or web address] 01/24/2023, 1:40 PM

## 2023-01-24 NOTE — Progress Notes (Addendum)
Signed        Subjective:  Patient ID: Katherine Herrera, female    DOB: [birth year],  MRN: [id number]   Patient seen this afternoon. Relates doing much better and not having as much pain.  Denies any fever, n,v,c. Denies any pain in the foot and most of the pain is in her leg.         Past Medical History:  Diagnosis Date   Anxiety     Arthritis      hands   B12 deficiency     Chronic constipation     Chronic pain syndrome      neck, lower back, and foot   COPD (chronic obstructive pulmonary disease) ([place])     Diabetic ulcer of right foot ([place])     Gout      possible   History of seizure      05-26-2020  per pt as child and last one in late teen's   History of sepsis      due to lower extremity cellulits 2019 left , right 03/ 2021   History of syncope     Hyperlipidemia     Hypothyroidism     Impaired range of motion of cervical spine      per pt hx cervical fusion C4 -- 7,  has to be careful about turning her head to the right can cause her to pass out   Insulin dependent type 2 diabetes mellitus [facility])      endocrinologist--- dr Chalmers Cater    (05-26-2020 per pt check's blood sugar twice dialy and at times a third time,  fasting am blood sugar's---- 200 -- 250)   LBP (low back pain)      DR Patrice Paradise   Migraine     Peripheral neuropathy     RBBB (right bundle branch block)     Tenosynovitis of foot and ankle 09/03/2013   TTS (tarsal tunnel syndrome) 2010    Left Foot [facility] in [place]   Vitamin D deficiency     Wears glasses             Past Surgical History:  Procedure Laterality Date   ANTERIOR CERVICAL DECOMP/DISCECTOMY FUSION   02-12-2004;  07-06-2006    C4--5 in 2005;  C5 --7  in  [id number]   [place] Right 07/05/2019    Procedure: Application Of Wound Vac;  Surgeon: Evelina Bucy, DPM;  Location: [facility];  Service: Podiatry;  Laterality: Right;   APPLICATION OF WOUND VAC Right 04/10/2022    Procedure: APPLICATION OF WOUND VAC, PLACEMENT OF ANTIBIOTIC BEADS;   Surgeon: [name], DPM;  Location: [facility];  Service: Podiatry;  Laterality: Right;   BONE BIOPSY Right 05/29/2020    Procedure: BONE BIOPSY;  Surgeon: Evelina Bucy, DPM;  Location: [facility];  Service: Podiatry;  Laterality: Right;   CARDIAC CATHETERIZATION   05-27-2002   dr Einar Gip    for positive cardiolite;  normal coronaries and LVF, ef 70%   CARPAL TUNNEL RELEASE Bilateral left 12-25-2008;  right 09-22-2009  both @[facility]    HARDWARE REMOVAL   01-07-2016  @[facility]     right foot   INCISION AND DRAINAGE OF WOUND Right 07/20/2019    Procedure: IRRIGATION AND DEBRIDEMENT foot;  Surgeon: Evelina Bucy, DPM;  Location: [facility];  Service: Podiatry;  Laterality: Right;   INCISION AND DRAINAGE OF WOUND Right 07/22/2019    Procedure: IRRIGATION AND DEBRIDEMENT RIGHT FOOT; APPLICATION OF  WOUND VAC;  Surgeon: Evelina Bucy, DPM;  Location: [facility];  Service: Podiatry;  Laterality: Right;   INCISION AND DRAINAGE OF WOUND Right 07/24/2019    Procedure: IRRIGATION AND DEBRIDEMENT WOUND, APPLICATION OF WOUND VAC;  Surgeon: Evelina Bucy, DPM;  Location: [facility];  Service: Podiatry;  Laterality: Right;   INCISION AND DRAINAGE OF WOUND Right 05/29/2020    Procedure: RIGHT FOOT WOUND DEBRIDEMENT AND IRRIGATION;  Surgeon: Evelina Bucy, DPM;  Location: [facility];  Service: Podiatry;  Laterality: Right;   INCISION AND DRAINAGE OF WOUND Right 04/13/2022    Procedure: IRRIGATION AND DEBRIDEMENT WOUND;  Surgeon: Felipa Furnace, DPM;  Location: [facility];  Service: Podiatry;  Laterality: Right;   IRRIGATION AND DEBRIDEMENT FOOT Right 04/10/2022    Procedure: IRRIGATION AND DEBRIDEMENT FOOT;  Surgeon: [name], DPM;  Location: [facility];  Service: Podiatry;  Laterality: Right;   LUMBAR DISC SURGERY   07-07-2005   @[facility]     L5 - S1   METATARSAL HEAD EXCISION Bilateral 01/02/2019    Procedure: Left foot 5th metatarsal resection, wound excision, adjacent tissue transfer. Right fifth  metatarsal head resection, fitfth metatarsal base resection, intermediate wound repair.;  Surgeon: Evelina Bucy, DPM;  Location: [facility];  Service: Podiatry;  Laterality: Bilateral;   METATARSAL HEAD EXCISION Right 05/12/2021    Procedure: Right 5th met base resection;  Surgeon: Evelina Bucy, DPM;  Location: [facility];  Service: Podiatry;  Laterality: Right;   METATARSAL OSTEOTOMY Right 07/05/2019    Procedure: RIGHT FOOT 5TH METATARSAL RESECTION; PERONEAL TENDONESIS, EXCISION OF ULCER, LOCAL TISSUE TRANSFER ;  Surgeon: Evelina Bucy, DPM;  Location: [facility];  Service: Podiatry;  Laterality: Right;   ORIF METATARSAL FRACTURE   09-22-2015  @[facility]     right 5th   TOTAL ABDOMINAL HYSTERECTOMY W/ BILATERAL SALPINGOOPHORECTOMY   2000 approx.          Latest Ref Rng & Units 01/23/2023    7:23 AM 01/22/2023   10:09 PM 04/15/2022    2:43 AM  CBC  WBC 4.0 - 10.5 K/uL 8.4  9.8  13.4   Hemoglobin 12.0 - 15.0 g/dL 12.2  14.4  14.0   Hematocrit 36.0 - 46.0 % 38.2  44.0  42.1   Platelets 150 - 400 K/uL 230  255  303           Latest Ref Rng & Units 01/23/2023    7:23 AM 01/22/2023   10:09 PM 04/16/2022    9:58 PM  BMP  Glucose 70 - 99 mg/dL 180  333  410   BUN 6 - 20 mg/dL 20  21     Creatinine 0.44 - 1.00 mg/dL 1.07  1.27     Sodium 135 - 145 mmol/L 135  133     Potassium 3.5 - 5.1 mmol/L 4.1  4.7     Chloride 98 - 111 mmol/L 103  96     CO2 22 - 32 mmol/L 25  25     Calcium 8.9 - 10.3 mg/dL 8.3  8.8           Objective:        Vitals:    01/23/23 0800 01/23/23 1016  BP: 111/74 118/79  Pulse: 71 72  Resp: 18 16  Temp:   98 F (36.7 C)  SpO2: 96% 100%      General:AA&O x 3. Normal mood and affect    Vascular: DP and  PT pulses 2/4 bilateral. Brisk capillary refill to all digits. Pedal hair present    Neruological. Epicritic sensation grossly intact.    Derm: Plantar lateral right foot wound with fibrinous greanular base measuring 3 cm x 4 cm x 0.3 cm . No  erythema or edema present. No purulence. No probe to bone. Erythema and edema noted to right leg starting at ankle and spreading to knee. Much improved from yesterday with no pain to palpation today.                MSK: MMT 5/5 in dorsiflexion, plantar flexion, inversion and eversion. Normal joint ROM without pain or crepitus.       Right foot X-ray IMPRESSION: Changes of chronic osteomyelitis suggested at the base of the fifth metatarsal and adjacent cuboidal bone similar to prior study. Soft tissue changes are improving since the prior study.  MRI right foot  IMPRESSION: Lateral midfoot ulcer with postsurgical changes of prior base of fifth metatarsal resection. Chronic osseous changes in the residual base of fifth metatarsal and lateral cuboid, with underlying marrow edema and mild enhancement suggesting reactive marrow change or early acute on chronic osteomyelitis. No evidence of soft tissue abscess.      Assessment & Plan:  Patient was evaluated and treated and all questions answered.   DX: Right diabetic foot wound, cellulitis right leg Wound care: betadine, DSD to right foot   Antibiotics: Per primary currently on vanc, cefepime and Imaging reviewed. Chronic changes noted to base of right fifth metatarsal with no new osseous erosions noted.  MRI reviewed there is some reactive edema in bone based on clinical exam do not feel this is an active acute osteomyelitis in the area of the wound. Likely cellulitis that appears to be improving with IV antibiotics.  No plan for surgical intervention.  Discussed with patient treatment course and cellulitis of leg. Feel patient will be ok for discharge on 1-2 weeks oral antibiotics. She does states she does not have a ride to go home until Thursday evening so would continue IV until then and transition to oral tomorrow for discharge.   Patient in agreement with plan and all questions answered.  Podiatry will follow-up outpatient in  about a week and will get this scheduled.    Lorenda Peck, DPM  Accessible via secure chat for questions or concerns.

## 2023-01-25 ENCOUNTER — Other Ambulatory Visit (HOSPITAL_COMMUNITY): Payer: Self-pay | Admitting: *Deleted

## 2023-01-25 DIAGNOSIS — L03115 Cellulitis of right lower limb: Secondary | ICD-10-CM | POA: Diagnosis not present

## 2023-01-25 LAB — BASIC METABOLIC PANEL
Anion gap: 6 (ref 5–15)
BUN: 26 mg/dL — ABNORMAL HIGH (ref 6–20)
CO2: 26 mmol/L (ref 22–32)
Calcium: 8.3 mg/dL — ABNORMAL LOW (ref 8.9–10.3)
Chloride: 100 mmol/L (ref 98–111)
Creatinine, Ser: 1.13 mg/dL — ABNORMAL HIGH (ref 0.44–1.00)
GFR, Estimated: 57 mL/min — ABNORMAL LOW (ref >60)
Glucose, Bld: 256 mg/dL — ABNORMAL HIGH (ref 70–99)
Potassium: 4.7 mmol/L (ref 3.5–5.1)
Sodium: 132 mmol/L — ABNORMAL LOW (ref 135–145)

## 2023-01-25 LAB — CBC
HCT: 37.4 % (ref 36.0–46.0)
Hemoglobin: 12.2 g/dL (ref 12.0–15.0)
MCH: 28.4 pg (ref 26.0–34.0)
MCHC: 32.6 g/dL (ref 30.0–36.0)
MCV: 87 fL (ref 80.0–100.0)
Platelets: 227 10*3/uL (ref 150–400)
RBC: 4.3 MIL/uL (ref 3.87–5.11)
RDW: 14.3 % (ref 11.5–15.5)
WBC: 8.1 10*3/uL (ref 4.0–10.5)
nRBC: 0 % (ref 0.0–0.2)

## 2023-01-25 LAB — C-REACTIVE PROTEIN: CRP: 0.9 mg/dL (ref <1.0)

## 2023-01-25 LAB — GLUCOSE, CAPILLARY
Glucose-Capillary: 168 mg/dL — ABNORMAL HIGH (ref 70–99)
Glucose-Capillary: 119 mg/dL — ABNORMAL HIGH (ref 70–99)
Glucose-Capillary: 164 mg/dL — ABNORMAL HIGH (ref 70–99)
Glucose-Capillary: 293 mg/dL — ABNORMAL HIGH (ref 70–99)

## 2023-01-25 LAB — SEDIMENTATION RATE: Sed Rate: 38 mm/hr — ABNORMAL HIGH (ref 0–22)

## 2023-01-25 MED ORDER — AMOXICILLIN-POT CLAVULANATE 875-125 MG PO TABS: 1.0000 | ORAL_TABLET | Freq: Two times a day (BID) | ORAL | 0 refills | Status: AC

## 2023-01-25 MED ORDER — AMOXICILLIN-POT CLAVULANATE 875-125 MG PO TABS
1.0000 | ORAL_TABLET | Freq: Two times a day (BID) | ORAL | Status: DC
  Administered 2023-01-25: 1 via ORAL
  Filled 2023-01-25: qty 1

## 2023-01-25 MED ORDER — LIVING WELL WITH DIABETES BOOK
Freq: Once | Status: DC
  Filled 2023-01-25: qty 1

## 2023-01-25 NOTE — Evaluation (Signed)
Occupational Therapy Evaluation Patient Details Name: Katherine Herrera MRN: [id number] DOB: 12-10-1965 Today's Date: 01/25/2023   History of Present Illness 57 y.o. female admitted 3/24 with Right lower leg cellulitis; chronic right foot ulcer; chronic right foot osteomyelitis.  PMHx:  insulin-dependent diabetes mellitus, chronic pain, anxiety, hypertension, hypothyroidism, and chronic right foot ulcer followed by podiatry.   Clinical Impression   PTA, pt lived alone and was mod I. Upon eval, pt with mild balance deficits. Overall performing BADL at mod I level during session. 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 All education provided and all questions answered. OT to sign off. Re-consult if change in status. Thank you for this order.      Recommendations for follow up therapy are one component of a multi-disciplinary discharge planning process, led by the attending physician.  Recommendations may be updated based on patient status, additional functional criteria and insurance authorization.   Assistance Recommended at Discharge PRN  Patient can return home with the following Assist for transportation;Help with stairs or ramp for entrance    Functional Status Assessment  Patient has had a recent decline in their functional status and demonstrates the ability to make significant improvements in function in a reasonable and predictable amount of time.  Equipment Recommendations  None recommended by OT    Recommendations for Other Services       Precautions / Restrictions Precautions Precautions: Fall Restrictions Weight Bearing Restrictions: No      Mobility Bed Mobility Overal bed mobility: Independent                  Transfers Overall transfer level: Modified independent                 General transfer comment: No physical assist needed      Balance                                            ADL either performed or assessed with clinical judgement   ADL Overall ADL's : Modified independent                                       General ADL Comments: Increased time     Vision Ability to See in Adequate Light: 0 Adequate Patient Visual Report: No change from baseline Vision Assessment?: No apparent visual deficits     Perception     Praxis      Pertinent Vitals/Pain Pain Assessment Pain Assessment: No/denies pain Faces Pain Scale: Hurts little more Pain Location: Rt foot Pain Descriptors / Indicators: Aching Pain Intervention(s): Limited activity within patient's tolerance, Monitored during session     Hand Dominance Right   Extremity/Trunk Assessment Upper Extremity Assessment Upper Extremity Assessment: Overall WFL for tasks assessed   Lower Extremity Assessment Lower Extremity Assessment: Defer to PT evaluation       Communication Communication Communication: No difficulties   Cognition Arousal/Alertness: Awake/alert Behavior During Therapy: WFL for tasks assessed/performed Overall Cognitive Status: Within Functional Limits for tasks assessed                                 General Comments: Able to perform simple money management,  delayed memory recall, is oriented     General Comments  VSS    Exercises     Shoulder Instructions      Home Living Family/patient expects to be discharged to:: Private residence Living Arrangements: Alone Available Help at Discharge: Family;Friend(s);Available PRN/intermittently Type of Home: Apartment Home Access: Ramped entrance     Home Layout: One level     Bathroom Shower/Tub: Tub/shower unit;Curtain   Biochemist, clinical: Standard     Home Equipment: Rollator (4 wheels);Shower seat;Cane - single Barista (2 wheels)          Prior Functioning/Environment Prior Level of Function : Independent/Modified Independent             Mobility Comments:  Uses cane primarily - sometimes rollator. Daughter takes her to get groceries. ADLs Comments: indep with ADL and IADL. Daughter drives her to get groceries and gets medical transport to appointments        OT Problem List: Decreased strength;Decreased activity tolerance;Impaired balance (sitting and/or standing);Decreased knowledge of use of DME or AE      OT Treatment/Interventions:      OT Goals(Current goals can be found in the care plan section) Acute Rehab OT Goals Patient Stated Goal: go home OT Goal Formulation: With patient Time For Goal Achievement: 02/08/23 Potential to Achieve Goals: Good  OT Frequency:      Co-evaluation              AM-PAC OT "6 Clicks" Daily Activity     Outcome Measure Help from another person eating meals?: None Help from another person taking care of personal grooming?: None Help from another person toileting, which includes using toliet, bedpan, or urinal?: None Help from another person bathing (including washing, rinsing, drying)?: None Help from another person to put on and taking off regular upper body clothing?: None Help from another person to put on and taking off regular lower body clothing?: None 6 Click Score: 24   End of Session Equipment Utilized During Treatment: Gait belt Nurse Communication: Mobility status  Activity Tolerance: Patient tolerated treatment well Patient left: in bed;with call bell/phone within reach;with nursing/sitter in room  OT Visit Diagnosis: Unsteadiness on feet (R26.81);Muscle weakness (generalized) (M62.81)                Time: 1033-1050 OT Time Calculation (min): 17 min Charges:  OT General Charges $OT Visit: 1 Visit OT Evaluation $OT Eval Low Complexity: 1 Low  Elder Cyphers, OTR/L [facility] Acute Rehabilitation Office: [phone]   Magnus Ivan 01/25/2023, 1:59 PM

## 2023-01-25 NOTE — Progress Notes (Signed)
At discharge, IV removed, given AVS to pt explained directions for meds and med pick up, pt transported in w/c off you and stable. Family waiting at main entrance.

## 2023-01-26 ENCOUNTER — Encounter: Payer: Self-pay | Admitting: *Deleted

## 2023-01-26 ENCOUNTER — Telehealth: Payer: Self-pay | Admitting: *Deleted

## 2023-01-26 ENCOUNTER — Other Ambulatory Visit: Payer: Self-pay | Admitting: Podiatry

## 2023-01-26 ENCOUNTER — Ambulatory Visit: Payer: Self-pay

## 2023-01-26 NOTE — Discharge Summary (Signed)
Physician Discharge Summary   Patient: Katherine Herrera MRN: [id number] DOB: [birth year]  Admit date:     01/22/2023  Discharge date: 01/25/2023  Discharge Physician: Alma Friendly   PCP: Cassandria Anger, MD   Recommendations at discharge:   Follow up with PCP in 1 week Follow up with podiatry as scheduled  Discharge Diagnoses: Principal Problem:   Cellulitis of right leg Active Problems:   AKI (acute kidney injury) ([place])   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 ([place])   Essential hypertension   Hypothyroidism   Anxiety disorder   Right foot ulcer [facility])    Hospital Course: Katherine Herrera is a 57 y.o. female with medical history significant for insulin-dependent diabetes mellitus, chronic pain, anxiety, hypertension, hypothyroidism, and chronic right foot ulcer followed by podiatry who presents to the emergency department with worsening redness, pain, swelling, and warmth traveling up her right leg since 01/20/2023. Pt called her podiatry clinic and was advised to seek evaluation in the emergency department.   Patient reports feeling better on 01/25/23, denies any new complaints. Eager to be discharged   Assessment and Plan:  Right lower leg cellulitis; chronic right foot ulcer; chronic right foot osteomyelitis  Not septic on admission; no gas or evidence for acute osteomyelitis on plain radiographs in ED  BC X 2 NGTD MRI foot consistent w/ chronic osteo S/p vanc/cefepime/flagy Podiatry consulted, no plans for surgical intervention, discharge on 2 weeks of PO augmentin with outpatient follow up with podiatry   AKI  Resolved s/p IVF SCr 1.27 on admission   Insulin-dependent DM  A1c 10.6 Continue home regimen Pt follows endocrinology   Anxiety  Continue Prozac, Xanax as needed    Chronic pain  Continue home regimen     Hypothyroidism levothyroxine      Pain control - [place] Controlled Substance Reporting System database was reviewed.  and patient was instructed, not to drive, operate heavy machinery, perform activities at heights, swimming or participation in water activities or provide baby-sitting services while on Pain, Sleep and Anxiety Medications; until their outpatient Physician has advised to do so again. Also recommended to not to take more than prescribed Pain, Sleep and Anxiety Medications.    Consultants: Podiatry Procedures performed: None Disposition: Home Diet recommendation:  Carb modified diet    DISCHARGE MEDICATION: Allergies as of 01/25/2023       Reactions   Cephalexin Nausea And Vomiting   Demerol  [meperidine Hcl] Rash   Lovastatin Other (See Comments)   Possible myalgia   Metformin And Related Nausea And Vomiting   Sulfamethoxazole-trimethoprim    Other reaction(s): Unknown DILI, pancreatitis   Crestor [rosuvastatin Calcium]    myalgia   Hydromorphone    Other reaction(s): Unknown   Niacin Other (See Comments)   Unknown    Other Other (See Comments)   Hydromorphone Hcl Itching   Patient has been tolerating Hydromorphone tablets without adverse effect (07/09/19) Other reaction(s): Unknown   Paroxetine Other (See Comments)   Unknown         Medication List     STOP taking these medications    azithromycin 500 MG tablet Commonly known as: ZITHROMAX   clonazePAM 0.5 MG tablet Commonly known as: KLONOPIN   colchicine 0.6 MG tablet   doxycycline 100 MG tablet Commonly known as: VIBRA-TABS   loratadine 10 MG tablet Commonly known as: CLARITIN   Santyl 250 UNIT/GM ointment Generic drug: collagenase   silver sulfADIAZINE 1 % cream Commonly known as:  SILVADENE       TAKE these medications    accu-chek softclix lancets Test two times daily   albuterol 108 (90 Base) MCG/ACT inhaler Commonly known as: VENTOLIN HFA Inhale 1 puff into the lungs every 6 (six) hours as needed for shortness of breath.   amoxicillin-clavulanate 875-125 MG tablet Commonly known as:  AUGMENTIN Take 1 tablet by mouth every 12 (twelve) hours for 14 days.   aspirin 325 MG tablet Take 325 mg by mouth daily.   B-D INS SYR ULTRAFINE 1CC/31G 31G X 5/16" 1 ML Misc Generic drug: Insulin Syringe-Needle U-100 USE AS DIRECTED   baclofen 10 MG tablet Commonly known as: LIORESAL TAKE 2 TABLETS BY MOUTH TWICE  DAILY   bisacodyl 5 MG EC tablet Commonly known as: DULCOLAX Take 10 mg by mouth at bedtime.   cholecalciferol 25 MCG (1000 UNIT) tablet Commonly known as: VITAMIN D3 Take 1,000 Units by mouth daily.   cyanocobalamin 1000 MCG tablet Commonly known as: VITAMIN B12 Take 1,000 mcg by mouth daily.   docusate sodium 100 MG capsule Commonly known as: COLACE Take 100 mg by mouth at bedtime.   ezetimibe 10 MG tablet Commonly known as: ZETIA TAKE 1 TABLET BY MOUTH DAILY   FLUoxetine 10 MG capsule Commonly known as: PROZAC TAKE 1 CAPSULE BY MOUTH EVERY DAY What changed: how much to take   FreeStyle Libre 2 Sensor Misc as directed subcutaneous every 14 days   gabapentin 800 MG tablet Commonly known as: NEURONTIN TAKE 1 TABLET BY MOUTH 4 TIMES  DAILY What changed: when to take this   insulin NPH-regular Human (70-30) 100 UNIT/ML injection Inject 20 Units into the skin 2 (two) times daily with a meal. What changed:  how much to take when to take this additional instructions   lamoTRIgine 25 MG tablet Commonly known as: LAMICTAL TAKE 1 TABLET BY MOUTH TWICE  DAILY   levothyroxine 75 MCG tablet Commonly known as: SYNTHROID TAKE 1 TABLET BY MOUTH DAILY  BEFORE BREAKFAST   lisinopril 10 MG tablet Commonly known as: ZESTRIL TAKE 1 TABLET BY MOUTH DAILY   ondansetron 4 MG tablet Commonly known as: ZOFRAN Take 4 mg by mouth every 8 (eight) hours as needed for nausea or vomiting.   OneTouch Delica Plus 0000000 Misc   OneTouch Verio test strip Generic drug: glucose blood 1 each 3 (three) times daily.   OneTouch Verio w/Device Kit   oxyCODONE 15 MG  immediate release tablet Commonly known as: ROXICODONE Take 1 tablet (15 mg total) by mouth every 6 (six) hours as needed for pain. What changed: Another medication with the same name was removed. Continue taking this medication, and follow the directions you see here.   rOPINIRole 0.5 MG tablet Commonly known as: REQUIP TAKE 1 TABLET BY MOUTH AT  BEDTIME   sodium hypochlorite external solution Commonly known as: DAKIN'S 1/2 STRENGTH Irrigate with 1 Application as directed daily.        Follow-up Information     Plotnikov, [name], MD. Schedule an appointment as soon as possible for a visit in 1 week(s).   Specialty: Internal Medicine Contact information: [place] [address] [phone]                Discharge Exam: Danley Danker Weights   01/23/23 1052  Weight: 91.2 kg   General: NAD  Cardiovascular: S1, S2 present Respiratory: CTAB Abdomen: Soft, nontender, nondistended, bowel sounds present Musculoskeletal: No bilateral pedal edema noted Skin: Marked area  of erythema improved  Psychiatry: Normal mood   Condition at discharge: stable  The results of significant diagnostics from this hospitalization (including imaging, microbiology, ancillary and laboratory) are listed below for reference.   Imaging Studies: MR FOOT RIGHT W WO CONTRAST  Result Date: 01/23/2023 CLINICAL DATA:  Foot swelling, diabetic, osteomyelitis suspected, xray done lateral midfoot ulcer EXAM: MRI OF THE RIGHT FOREFOOT WITHOUT AND WITH CONTRAST TECHNIQUE: Multiplanar, multisequence MR imaging of the right forefoot was performed before and after the administration of intravenous contrast. CONTRAST:  68mL GADAVIST GADOBUTROL 1 MMOL/ML IV SOLN COMPARISON:  Multiple prior right foot radiographs, most recently 01/22/2023, MRI 03/11/2022 FINDINGS: Bones/Joint/Cartilage Postsurgical changes of prior fifth metatarsal base resection. There are sclerotic changes within the residual base  of fifth metatarsal with mild marrow edema and enhancement. There is also mild marrow edema and enhancement within the adjacent lateral aspect of the cuboid with chronic cortical erosion of the cuboid. Marrow signal abnormality is slightly worsened in comparison to prior MRI in May 2023. Ligaments Ankle ligaments are poorly evaluated due to scan obliquity. Muscles and Tendons Postsurgical changes of the peroneal brevis. Peroneal longus tendon appears intact. Soft tissues Lateral midfoot ulcer with adjacent skin thickening and underlying soft tissue swelling. There is no organized fluid collection. IMPRESSION: Lateral midfoot ulcer with postsurgical changes of prior base of fifth metatarsal resection. Chronic osseous changes in the residual base of fifth metatarsal and lateral cuboid, with underlying marrow edema and mild enhancement suggesting reactive marrow change or early acute on chronic osteomyelitis. No evidence of soft tissue abscess. Electronically Signed   By: Maurine Simmering M.D.   On: 01/23/2023 15:48   DG Tibia/Fibula Right  Result Date: 01/22/2023 CLINICAL DATA:  Ulcer and infection EXAM: RIGHT TIBIA AND FIBULA - 2 VIEW COMPARISON:  None Available. FINDINGS: Diffuse bone demineralization. No evidence of acute fracture or dislocation. No focal bone lesion or bone erosion. Soft tissues are unremarkable. IMPRESSION: No acute bony abnormalities. Electronically Signed   By: Lucienne Capers M.D.   On: 01/22/2023 22:47   DG Foot Complete Right  Result Date: 01/22/2023 CLINICAL DATA:  Ulcer and infection. EXAM: RIGHT FOOT COMPLETE - 3+ VIEW COMPARISON:  04/08/2022 FINDINGS: Diffuse bone demineralization. Previous resection or resorption of the distal right fifth metatarsal bone, unchanged. Bone erosion and sclerosis involving the base of the fifth metatarsal in the adjacent cuboidal bone likely representing osteomyelitis. Appearance is similar to prior study. No new areas of bone resorption or appreciated.  Degenerative changes in the interphalangeal and intertarsal joints. No radiopaque soft tissue foreign bodies or soft tissue gas. IMPRESSION: Changes of chronic osteomyelitis suggested at the base of the fifth metatarsal and adjacent cuboidal bone similar to prior study. Soft tissue changes are improving since the prior study. Electronically Signed   By: Lucienne Capers M.D.   On: 01/22/2023 22:46    Microbiology: Results for orders placed or performed during the hospital encounter of 01/22/23  Blood culture (routine x 2)     Status: None (Preliminary result)   Collection Time: 01/23/23  3:12 AM   Specimen: BLOOD  Result Value Ref Range Status   Specimen Description BLOOD LEFT WRIST  Final   Special Requests   Final    BOTTLES DRAWN AEROBIC AND ANAEROBIC Blood Culture results may not be optimal due to an inadequate volume of blood received in culture bottles   Culture   Final    NO GROWTH 3 DAYS Performed at [facility], [place]  [address], [place], [place] [address]    Report Status PENDING  Incomplete   *Note: Due to a large number of results and/or encounters for the requested time period, some results have not been displayed. A complete set of results can be found in Results Review.    Labs: CBC: Recent Labs  Lab 01/22/23 2209 01/23/23 0723 01/24/23 0100 01/25/23 0110  WBC 9.8 8.4 7.8 8.1  NEUTROABS 5.7  --   --   --   HGB 14.4 12.2 12.1 12.2  HCT 44.0 38.2 37.3 37.4  MCV 87.8 88.4 87.1 87.0  PLT 255 230 226 [id number]   Basic Metabolic Panel: Recent Labs  Lab 01/22/23 2209 01/23/23 0723 01/24/23 0100 01/25/23 0110  NA 133* 135 133* 132*  K 4.7 4.1 4.7 4.7  CL 96* 103 99 100  CO2 25 25 27 26   GLUCOSE 333* 180* 123* 256*  BUN 21* 20 22* 26*  CREATININE 1.27* 1.07* 1.04* 1.13*  CALCIUM 8.8* 8.3* 8.5* 8.3*   Liver Function Tests: Recent Labs  Lab 01/22/23 2209  AST 17  ALT 16  ALKPHOS 109  BILITOT 0.2*  PROT 7.6  ALBUMIN 3.2*   CBG: Recent Labs  Lab  01/24/23 2331 01/25/23 0358 01/25/23 0748 01/25/23 1114 01/25/23 1648  GLUCAP 253* 168* 119* 164* 293*    Discharge time spent: greater than 30 minutes.  Signed: Alma Friendly, MD [facility] 01/26/2023

## 2023-01-26 NOTE — Chronic Care Management (AMB) (Signed)
   01/26/2023  Katherine Herrera 15-Oct-1966 [id number]   Reason for Encounter: Patient is not currently enrolled in the CCM program. CCM status changed to previously enrolled.   Horris Latino RN Care Manager/Chronic Care Management [phone]

## 2023-01-26 NOTE — Transitions of Care (Post Inpatient/ED Visit) (Signed)
   01/26/2023  Name: NYA BALAM MRN: [id number] DOB: Dec 09, 1965  Today's TOC FU Call Status: Today's TOC FU Call Status:: Unsuccessul Call (1st Attempt) Unsuccessful Call (1st Attempt) Date: 01/26/23  Attempted to reach the patient regarding the most recent Inpatient visit; left HIPAA compliant voice message requesting call back  Follow Up Plan: Additional outreach attempts will be made to reach the patient to complete the Transitions of Care (Post Inpatient visit) call.   Oneta Rack, RN, BSN, CCRN Alumnus RN CM Care Coordination/ Transition of [place] Management [phone]: direct office

## 2023-01-27 ENCOUNTER — Encounter: Payer: Self-pay | Admitting: *Deleted

## 2023-01-27 ENCOUNTER — Telehealth: Payer: Self-pay | Admitting: *Deleted

## 2023-01-27 DIAGNOSIS — M1990 Unspecified osteoarthritis, unspecified site: Secondary | ICD-10-CM | POA: Diagnosis not present

## 2023-01-27 DIAGNOSIS — M86671 Other chronic osteomyelitis, right ankle and foot: Secondary | ICD-10-CM | POA: Diagnosis not present

## 2023-01-27 DIAGNOSIS — E11621 Type 2 diabetes mellitus with foot ulcer: Secondary | ICD-10-CM | POA: Diagnosis not present

## 2023-01-27 DIAGNOSIS — E1142 Type 2 diabetes mellitus with diabetic polyneuropathy: Secondary | ICD-10-CM | POA: Diagnosis not present

## 2023-01-27 DIAGNOSIS — G894 Chronic pain syndrome: Secondary | ICD-10-CM | POA: Diagnosis not present

## 2023-01-27 DIAGNOSIS — E538 Deficiency of other specified B group vitamins: Secondary | ICD-10-CM | POA: Diagnosis not present

## 2023-01-27 DIAGNOSIS — E559 Vitamin D deficiency, unspecified: Secondary | ICD-10-CM | POA: Diagnosis not present

## 2023-01-27 DIAGNOSIS — Z7982 Long term (current) use of aspirin: Secondary | ICD-10-CM | POA: Diagnosis not present

## 2023-01-27 DIAGNOSIS — J449 Chronic obstructive pulmonary disease, unspecified: Secondary | ICD-10-CM | POA: Diagnosis not present

## 2023-01-27 DIAGNOSIS — Z79891 Long term (current) use of opiate analgesic: Secondary | ICD-10-CM | POA: Diagnosis not present

## 2023-01-27 DIAGNOSIS — F17210 Nicotine dependence, cigarettes, uncomplicated: Secondary | ICD-10-CM | POA: Diagnosis not present

## 2023-01-27 DIAGNOSIS — E785 Hyperlipidemia, unspecified: Secondary | ICD-10-CM | POA: Diagnosis not present

## 2023-01-27 DIAGNOSIS — Z794 Long term (current) use of insulin: Secondary | ICD-10-CM | POA: Diagnosis not present

## 2023-01-27 DIAGNOSIS — I4510 Unspecified right bundle-branch block: Secondary | ICD-10-CM | POA: Diagnosis not present

## 2023-01-27 DIAGNOSIS — L97412 Non-pressure chronic ulcer of right heel and midfoot with fat layer exposed: Secondary | ICD-10-CM | POA: Diagnosis not present

## 2023-01-27 DIAGNOSIS — M545 Low back pain, unspecified: Secondary | ICD-10-CM | POA: Diagnosis not present

## 2023-01-27 DIAGNOSIS — K5909 Other constipation: Secondary | ICD-10-CM | POA: Diagnosis not present

## 2023-01-27 DIAGNOSIS — E039 Hypothyroidism, unspecified: Secondary | ICD-10-CM | POA: Diagnosis not present

## 2023-01-27 DIAGNOSIS — G43909 Migraine, unspecified, not intractable, without status migrainosus: Secondary | ICD-10-CM | POA: Diagnosis not present

## 2023-01-27 NOTE — Transitions of Care (Post Inpatient/ED Visit) (Signed)
01/27/2023  Name: Katherine Herrera MRN: [id number] DOB: Sep 24, 1966  Today's TOC FU Call Status: Today's TOC FU Call Status:: Successful TOC FU Call Competed TOC FU Call Complete Date: 01/27/23  Transition Care Management Follow-up Telephone Call Date of Discharge: 01/25/23 Discharge Facility: [facility]) Type of Discharge: Inpatient Admission Primary Inpatient Discharge Diagnosis:: (R) LE cellulitis/ chronic (R) foot osteomyelitis How have you been since you were released from the hospital?: Better ("I am doing better; I am taking the antibiotics and the home health nurse that has been coming out for a long time, she just left and said everything looks good.  I am walking with a boot and managing okay") Any questions or concerns?: No  Items Reviewed: Did you receive and understand the discharge instructions provided?: Yes (thoroughly reviewed with patient who verbalizes good understanding of same) Medications obtained and verified?: Yes (Medications Reviewed) (Partial medication review completed; confirmed patient obtained/ is taking all newly Rx'd medications as instructed; self-manages medications and denies questions/ concerns around medications today; declines full medication review today) Any new allergies since your discharge?: No Dietary orders reviewed?: Yes Type of Diet Ordered:: diabetic Do you have support at home?: Yes People in Home: alone Name of Support/Comfort Primary Source: resides alone, reports she is independent in self-care activities; daughter lives nearby and assists as needed/ indicated  [place] and Equipment/Supplies: [place] Ordered?: NA (reports "has had a home health nurse coming for quite awhile now;" states unable to recall name of agency-- reports, "she just left my house a few minutes ago") Any new equipment or medical supplies ordered?: No  Functional Questionnaire: Do you need assistance with bathing/showering or dressing?:  No Do you need assistance with meal preparation?: No Do you need assistance with eating?: No Do you have difficulty maintaining continence: No Do you need assistance with getting out of bed/getting out of a chair/moving?: No Do you have difficulty managing or taking your medications?: No  Follow up appointments reviewed: PCP Follow-up appointment confirmed?: Yes (declines making sooner appointment with PCP- states going to podiatry specialist 02/06/23- states "they are the ones who are  managing everything around my foot and leg issues") Date of PCP follow-up appointment?: 02/15/23 Follow-up Provider: PCP [facility] Follow-up appointment confirmed?: Yes Date of Specialist follow-up appointment?: 02/06/23 Follow-Up Specialty Provider:: podiatry provider- who is well established in patient's care Do you need transportation to your follow-up appointment?: No Do you understand care options if your condition(s) worsen?: Yes-patient verbalized understanding  SDOH Interventions Today    Flowsheet Row Most Recent Value  SDOH Interventions   Food Insecurity Interventions Intervention Not Indicated  Transportation Interventions Intervention Not Indicated  [reports "I have rides to my doctor appointments through my insurance company and have been using these rides for a long time, " reports family assists with transportation to errands]      TOC Interventions Today    Flowsheet Row Most Recent Value  TOC Interventions   TOC Interventions Discussed/Reviewed TOC Interventions Discussed, S/S of infection  [provided my direct contact information should questions/ concerns/ needs arise post-TOC call, prior to RN CM telephone visit]      Interventions Today    Flowsheet Row Most Recent Value  Chronic Disease   Chronic disease during today's visit Other  [chronic osteomyelitis of (R) foot with recent (R) LE cellulitis]  General Interventions   General Interventions Discussed/Reviewed  General Interventions Discussed, Doctor Visits, Referral to Nurse, Communication with  Doctor Visits Discussed/Reviewed Doctor  Visits Discussed, Doctor Visits Reviewed, PCP, Specialist  PCP/Specialist Visits Compliance with follow-up visit  Communication with RN  Nutrition Interventions   Nutrition Discussed/Reviewed Nutrition Discussed  Pharmacy Interventions   Pharmacy Dicussed/Reviewed Pharmacy Topics Discussed      Oneta Rack, RN, BSN, CCRN Alumnus RN CM Care Coordination/ Transition of [place] Management [phone]: direct office

## 2023-01-28 DIAGNOSIS — M86671 Other chronic osteomyelitis, right ankle and foot: Secondary | ICD-10-CM | POA: Diagnosis not present

## 2023-01-28 DIAGNOSIS — E08621 Diabetes mellitus due to underlying condition with foot ulcer: Secondary | ICD-10-CM | POA: Diagnosis not present

## 2023-01-28 DIAGNOSIS — L02611 Cutaneous abscess of right foot: Secondary | ICD-10-CM | POA: Diagnosis not present

## 2023-01-28 LAB — CULTURE, BLOOD (ROUTINE X 2): Culture: NO GROWTH

## 2023-01-31 DIAGNOSIS — I4510 Unspecified right bundle-branch block: Secondary | ICD-10-CM | POA: Diagnosis not present

## 2023-01-31 DIAGNOSIS — K5909 Other constipation: Secondary | ICD-10-CM | POA: Diagnosis not present

## 2023-01-31 DIAGNOSIS — E1142 Type 2 diabetes mellitus with diabetic polyneuropathy: Secondary | ICD-10-CM | POA: Diagnosis not present

## 2023-01-31 DIAGNOSIS — J449 Chronic obstructive pulmonary disease, unspecified: Secondary | ICD-10-CM | POA: Diagnosis not present

## 2023-01-31 DIAGNOSIS — Z7982 Long term (current) use of aspirin: Secondary | ICD-10-CM | POA: Diagnosis not present

## 2023-01-31 DIAGNOSIS — E559 Vitamin D deficiency, unspecified: Secondary | ICD-10-CM | POA: Diagnosis not present

## 2023-01-31 DIAGNOSIS — M545 Low back pain, unspecified: Secondary | ICD-10-CM | POA: Diagnosis not present

## 2023-01-31 DIAGNOSIS — E785 Hyperlipidemia, unspecified: Secondary | ICD-10-CM | POA: Diagnosis not present

## 2023-01-31 DIAGNOSIS — E039 Hypothyroidism, unspecified: Secondary | ICD-10-CM | POA: Diagnosis not present

## 2023-01-31 DIAGNOSIS — L97412 Non-pressure chronic ulcer of right heel and midfoot with fat layer exposed: Secondary | ICD-10-CM | POA: Diagnosis not present

## 2023-01-31 DIAGNOSIS — F17210 Nicotine dependence, cigarettes, uncomplicated: Secondary | ICD-10-CM | POA: Diagnosis not present

## 2023-01-31 DIAGNOSIS — Z79891 Long term (current) use of opiate analgesic: Secondary | ICD-10-CM | POA: Diagnosis not present

## 2023-01-31 DIAGNOSIS — M1990 Unspecified osteoarthritis, unspecified site: Secondary | ICD-10-CM | POA: Diagnosis not present

## 2023-01-31 DIAGNOSIS — E11621 Type 2 diabetes mellitus with foot ulcer: Secondary | ICD-10-CM | POA: Diagnosis not present

## 2023-01-31 DIAGNOSIS — G43909 Migraine, unspecified, not intractable, without status migrainosus: Secondary | ICD-10-CM | POA: Diagnosis not present

## 2023-01-31 DIAGNOSIS — G894 Chronic pain syndrome: Secondary | ICD-10-CM | POA: Diagnosis not present

## 2023-01-31 DIAGNOSIS — E538 Deficiency of other specified B group vitamins: Secondary | ICD-10-CM | POA: Diagnosis not present

## 2023-01-31 DIAGNOSIS — Z794 Long term (current) use of insulin: Secondary | ICD-10-CM | POA: Diagnosis not present

## 2023-02-01 ENCOUNTER — Telehealth: Payer: Self-pay | Admitting: *Deleted

## 2023-02-01 NOTE — Telephone Encounter (Signed)
Katherine Herrera w/ [facility] is calling because the patient was seen by Dr Blenda Mounts in hospital on the 26 th,requesting verbal orders for patient to continue with betadine wet to dry dressings (which was done in hospital) until her appointment(8th) w/ Dr Amalia Hailey.Please advise.

## 2023-02-02 NOTE — Telephone Encounter (Signed)
Yes continue with betadine WTD dressings. Thanks

## 2023-02-02 NOTE — Telephone Encounter (Signed)
Katherine Herrera w/ [facility] has been updated with verbal orders per physician,verbalized understanding.

## 2023-02-06 ENCOUNTER — Ambulatory Visit (INDEPENDENT_AMBULATORY_CARE_PROVIDER_SITE_OTHER): Payer: 59 | Admitting: Podiatry

## 2023-02-06 DIAGNOSIS — L97412 Non-pressure chronic ulcer of right heel and midfoot with fat layer exposed: Secondary | ICD-10-CM

## 2023-02-06 DIAGNOSIS — E11621 Type 2 diabetes mellitus with foot ulcer: Secondary | ICD-10-CM

## 2023-02-06 NOTE — Progress Notes (Signed)
Chief Complaint  Patient presents with   Foot Ulcer    Right foot ulcer, with drainage, patient denies any pain,     HPI: 57 y.o. female presenting today after being discharged from the hospital for cellulitis of the right leg.  Patient has a chronic wound to the lateral aspect of the right foot complicated by cavovarus foot deformity.  Currently on oral antibiotics that were prescribed upon discharge.  WBAT in a Defender offloading cam walker.  Presenting for outpatient follow-up  Past Medical History:  Diagnosis Date   Anxiety    Arthritis    hands   B12 deficiency    Chronic constipation    Chronic pain syndrome    neck, lower back, and foot   COPD (chronic obstructive pulmonary disease) (HCC)    Diabetic ulcer of right foot (HCC)    Gout    possible   History of seizure    05-26-2020  per pt as child and last one in late teen's   History of sepsis    due to lower extremity cellulits 2019 left , right 03/ 2021   History of syncope    Hyperlipidemia    Hypothyroidism    Impaired range of motion of cervical spine    per pt hx cervical fusion C4 -- 7,  has to be careful about turning her head to the right can cause her to pass out   Insulin dependent type 2 diabetes mellitus [facility])    endocrinologist--- dr Talmage Nap    (05-26-2020 per pt check's blood sugar twice dialy and at times a third time,  fasting am blood sugar's---- 200 -- 250)   LBP (low back pain)    DR Noel Gerold   Migraine    Peripheral neuropathy    RBBB (right bundle branch block)    Tenosynovitis of foot and ankle 09/03/2013   TTS (tarsal tunnel syndrome) 2010   Left Foot [place] in [place]   Vitamin D deficiency    Wears glasses     Past Surgical History:  Procedure Laterality Date   ANTERIOR CERVICAL DECOMP/DISCECTOMY FUSION  02-12-2004;  07-06-2006   C4--5 in 2005;  C5 --7  in  2007   APPLICATION OF WOUND VAC Right 07/05/2019   Procedure: Application Of Wound Vac;  Surgeon: Park Liter, DPM;  Location: [facility];   Service: Podiatry;  Laterality: Right;   APPLICATION OF WOUND VAC Right 04/10/2022   Procedure: APPLICATION OF WOUND VAC, PLACEMENT OF ANTIBIOTIC BEADS;  Surgeon: Edwin Cap, DPM;  Location: [facility];  Service: Podiatry;  Laterality: Right;   BONE BIOPSY Right 05/29/2020   Procedure: BONE BIOPSY;  Surgeon: Park Liter, DPM;  Location: [facility];  Service: Podiatry;  Laterality: Right;   CARDIAC CATHETERIZATION  05-27-2002   dr Jacinto Halim   for positive cardiolite;  normal coronaries and LVF, ef 70%   CARPAL TUNNEL RELEASE Bilateral left 12-25-2008;  right 09-22-2009  both @[facility]    HARDWARE REMOVAL  01-07-2016  @[facility]    right foot   INCISION AND DRAINAGE OF WOUND Right 07/20/2019   Procedure: IRRIGATION AND DEBRIDEMENT foot;  Surgeon: Park Liter, DPM;  Location: [facility];  Service: Podiatry;  Laterality: Right;   INCISION AND DRAINAGE OF WOUND Right 07/22/2019   Procedure: IRRIGATION AND DEBRIDEMENT RIGHT FOOT; APPLICATION OF WOUND VAC;  Surgeon: Park Liter, DPM;  Location: [facility];  Service: Podiatry;  Laterality: Right;   INCISION AND DRAINAGE OF WOUND Right 07/24/2019  Procedure: IRRIGATION AND DEBRIDEMENT WOUND, APPLICATION OF WOUND VAC;  Surgeon: Park Liter, DPM;  Location: [facility];  Service: Podiatry;  Laterality: Right;   INCISION AND DRAINAGE OF WOUND Right 05/29/2020   Procedure: RIGHT FOOT WOUND DEBRIDEMENT AND IRRIGATION;  Surgeon: Park Liter, DPM;  Location: [facility];  Service: Podiatry;  Laterality: Right;   INCISION AND DRAINAGE OF WOUND Right 04/13/2022   Procedure: IRRIGATION AND DEBRIDEMENT WOUND;  Surgeon: Candelaria Stagers, DPM;  Location[facility];  Service: Podiatry;  Laterality: Right;   IRRIGATION AND DEBRIDEMENT FOOT Right 04/10/2022   Procedure: IRRIGATION AND DEBRIDEMENT FOOT;  Surgeon: Edwin Cap, DPM;  [facility];  Service: Podiatry;  Laterality: Right;   LUMBAR DISC SURGERY  07-07-2005   @[facility]    L5 - S1    METATARSAL HEAD EXCISION Bilateral 01/02/2019   Procedure: Left foot 5th metatarsal resection, wound excision, adjacent tissue transfer. Right fifth metatarsal head resection, fitfth metatarsal base resection, intermediate wound repair.;  Surgeon: Park Liter, DPM;  Location: [facility];  Service: Podiatry;  Laterality: Bilateral;   METATARSAL HEAD EXCISION Right 05/12/2021   Procedure: Right 5th met base resection;  Surgeon: Park Liter, DPM;  Location: [facility];  Service: Podiatry;  Laterality: Right;   METATARSAL OSTEOTOMY Right 07/05/2019   Procedure: RIGHT FOOT 5TH METATARSAL RESECTION; PERONEAL TENDONESIS, EXCISION OF ULCER, LOCAL TISSUE TRANSFER ;  Surgeon: Park Liter, DPM;  [facility];  Service: Podiatry;  Laterality: Right;   ORIF METATARSAL FRACTURE  09-22-2015  @[facility]    right 5th   TOTAL ABDOMINAL HYSTERECTOMY W/ BILATERAL SALPINGOOPHORECTOMY  2000 approx.    Allergies  Allergen Reactions   Cephalexin Nausea And Vomiting   Demerol  [Meperidine Hcl] Rash   Lovastatin Other (See Comments)    Possible myalgia    Metformin And Related Nausea And Vomiting   Sulfamethoxazole-Trimethoprim     Other reaction(s): Unknown DILI, pancreatitis   Crestor [Rosuvastatin Calcium]     myalgia   Hydromorphone     Other reaction(s): Unknown   Niacin Other (See Comments)    Unknown    Other Other (See Comments)   Hydromorphone Hcl Itching    Patient has been tolerating Hydromorphone tablets without adverse effect (07/09/19) Other reaction(s): Unknown   Paroxetine Other (See Comments)    Unknown      RT foot 02/06/2023  Physical Exam: General: The patient is alert and oriented x3 in no acute distress.  Dermatology: Ulcer noted plantar aspect of the right foot around the fifth metatarsal tubercle area.  Granular wound base.  Serous drainage.  No malodor.  No erythema around the foot.  Maceration with hyperkeratotic debris periwound.  There is no exposed bone muscle  tendon ligament or joint  Vascular: Palpable pedal pulses bilaterally. Capillary refill within normal limits.  Negative for any significant edema or erythema  Neurological: Light touch and protective threshold diminished  Musculoskeletal Exam: cavovarus foot type noted with excessive lateral column loading  Radiographic Exam RT foot 01/22/2023:  FINDINGS: Diffuse bone demineralization. Previous resection or resorption of the distal right fifth metatarsal bone, unchanged. Bone erosion and sclerosis involving the base of the fifth metatarsal in the adjacent cuboidal bone likely representing osteomyelitis. Appearance is similar to prior study. No new areas of bone resorption or appreciated. Degenerative changes in the interphalangeal and intertarsal joints. No radiopaque soft tissue foreign bodies or soft tissue gas.   IMPRESSION: Changes of chronic osteomyelitis suggested at the  base of the fifth metatarsal and adjacent cuboidal bone similar to prior study. Soft tissue changes are improving since the prior study.  MR FOOT RIGHT W WO CONTRAST 01/23/2023: IMPRESSION: Lateral midfoot ulcer with postsurgical changes of prior base of fifth metatarsal resection. Chronic osseous changes in the residual base of fifth metatarsal and lateral cuboid, with underlying marrow edema and mild enhancement suggesting reactive marrow change or early acute on chronic osteomyelitis. No evidence of soft tissue abscess.  Assessment: 1.  Ulcer fifth metatarsal tubercle right foot 2.  Varus foot type with lateral column loading right foot  -Patient evaluated -Continue oral antibiotics prescribed at discharge until completed -Continue WBAT offloading defender cam walker -Medically necessary excisional debridement including subcutaneous tissue was performed using a tissue nipper.  Excisional debridement of the necrotic nonviable tissue down to healthier bleeding viable tissue was performed with  postdebridement measurement same as pre- -Continue Betadine wet-to-dry dressings -Return to clinic 3 weeks with Dr. Boyd KerbsPatel      Remi Lopata M. Saxon Barich, DPM [facility]  Dr. Felecia ShellingBrent M. Bliss Behnke, DPM    [address]                                        [place], [place] [address]                Office [phone]  Fax [phone]

## 2023-02-07 DIAGNOSIS — E1165 Type 2 diabetes mellitus with hyperglycemia: Secondary | ICD-10-CM | POA: Diagnosis not present

## 2023-02-08 ENCOUNTER — Telehealth: Payer: Self-pay

## 2023-02-08 NOTE — Patient Outreach (Signed)
  Care Coordination   02/08/2023 Name: Katherine Herrera MRN: [id number] DOB: 18-Mar-1966   Care Coordination Outreach Attempts:  An unsuccessful telephone outreach was attempted today to offer the patient information about available care coordination services as a benefit of their health plan.   Follow Up Plan:  Additional outreach attempts will be made to offer the patient care coordination information and services.   Encounter Outcome:  No Answer   Care Coordination Interventions:  No, not indicated    [name], RN, MSN, BSN, CCM [facility] Care Coordinator [phone]

## 2023-02-09 DIAGNOSIS — E538 Deficiency of other specified B group vitamins: Secondary | ICD-10-CM | POA: Diagnosis not present

## 2023-02-09 DIAGNOSIS — K5909 Other constipation: Secondary | ICD-10-CM | POA: Diagnosis not present

## 2023-02-09 DIAGNOSIS — E11621 Type 2 diabetes mellitus with foot ulcer: Secondary | ICD-10-CM | POA: Diagnosis not present

## 2023-02-09 DIAGNOSIS — E1142 Type 2 diabetes mellitus with diabetic polyneuropathy: Secondary | ICD-10-CM | POA: Diagnosis not present

## 2023-02-09 DIAGNOSIS — G894 Chronic pain syndrome: Secondary | ICD-10-CM | POA: Diagnosis not present

## 2023-02-09 DIAGNOSIS — E785 Hyperlipidemia, unspecified: Secondary | ICD-10-CM | POA: Diagnosis not present

## 2023-02-09 DIAGNOSIS — Z794 Long term (current) use of insulin: Secondary | ICD-10-CM | POA: Diagnosis not present

## 2023-02-09 DIAGNOSIS — J449 Chronic obstructive pulmonary disease, unspecified: Secondary | ICD-10-CM | POA: Diagnosis not present

## 2023-02-09 DIAGNOSIS — E559 Vitamin D deficiency, unspecified: Secondary | ICD-10-CM | POA: Diagnosis not present

## 2023-02-09 DIAGNOSIS — G43909 Migraine, unspecified, not intractable, without status migrainosus: Secondary | ICD-10-CM | POA: Diagnosis not present

## 2023-02-09 DIAGNOSIS — M1990 Unspecified osteoarthritis, unspecified site: Secondary | ICD-10-CM | POA: Diagnosis not present

## 2023-02-09 DIAGNOSIS — Z79891 Long term (current) use of opiate analgesic: Secondary | ICD-10-CM | POA: Diagnosis not present

## 2023-02-09 DIAGNOSIS — L97412 Non-pressure chronic ulcer of right heel and midfoot with fat layer exposed: Secondary | ICD-10-CM | POA: Diagnosis not present

## 2023-02-09 DIAGNOSIS — I4510 Unspecified right bundle-branch block: Secondary | ICD-10-CM | POA: Diagnosis not present

## 2023-02-09 DIAGNOSIS — Z7982 Long term (current) use of aspirin: Secondary | ICD-10-CM | POA: Diagnosis not present

## 2023-02-09 DIAGNOSIS — F17210 Nicotine dependence, cigarettes, uncomplicated: Secondary | ICD-10-CM | POA: Diagnosis not present

## 2023-02-09 DIAGNOSIS — E039 Hypothyroidism, unspecified: Secondary | ICD-10-CM | POA: Diagnosis not present

## 2023-02-09 DIAGNOSIS — M545 Low back pain, unspecified: Secondary | ICD-10-CM | POA: Diagnosis not present

## 2023-02-13 DIAGNOSIS — E7800 Pure hypercholesterolemia, unspecified: Secondary | ICD-10-CM | POA: Diagnosis not present

## 2023-02-13 DIAGNOSIS — E1140 Type 2 diabetes mellitus with diabetic neuropathy, unspecified: Secondary | ICD-10-CM | POA: Diagnosis not present

## 2023-02-13 DIAGNOSIS — N189 Chronic kidney disease, unspecified: Secondary | ICD-10-CM | POA: Diagnosis not present

## 2023-02-13 DIAGNOSIS — E039 Hypothyroidism, unspecified: Secondary | ICD-10-CM | POA: Diagnosis not present

## 2023-02-13 DIAGNOSIS — E1165 Type 2 diabetes mellitus with hyperglycemia: Secondary | ICD-10-CM | POA: Diagnosis not present

## 2023-02-13 DIAGNOSIS — I10 Essential (primary) hypertension: Secondary | ICD-10-CM | POA: Diagnosis not present

## 2023-02-14 ENCOUNTER — Telehealth: Payer: Self-pay | Admitting: *Deleted

## 2023-02-14 DIAGNOSIS — G43909 Migraine, unspecified, not intractable, without status migrainosus: Secondary | ICD-10-CM | POA: Diagnosis not present

## 2023-02-14 DIAGNOSIS — E039 Hypothyroidism, unspecified: Secondary | ICD-10-CM | POA: Diagnosis not present

## 2023-02-14 DIAGNOSIS — M1990 Unspecified osteoarthritis, unspecified site: Secondary | ICD-10-CM | POA: Diagnosis not present

## 2023-02-14 DIAGNOSIS — E11621 Type 2 diabetes mellitus with foot ulcer: Secondary | ICD-10-CM | POA: Diagnosis not present

## 2023-02-14 DIAGNOSIS — G894 Chronic pain syndrome: Secondary | ICD-10-CM | POA: Diagnosis not present

## 2023-02-14 DIAGNOSIS — E785 Hyperlipidemia, unspecified: Secondary | ICD-10-CM | POA: Diagnosis not present

## 2023-02-14 DIAGNOSIS — F17210 Nicotine dependence, cigarettes, uncomplicated: Secondary | ICD-10-CM | POA: Diagnosis not present

## 2023-02-14 DIAGNOSIS — Z794 Long term (current) use of insulin: Secondary | ICD-10-CM | POA: Diagnosis not present

## 2023-02-14 DIAGNOSIS — M545 Low back pain, unspecified: Secondary | ICD-10-CM | POA: Diagnosis not present

## 2023-02-14 DIAGNOSIS — E1142 Type 2 diabetes mellitus with diabetic polyneuropathy: Secondary | ICD-10-CM | POA: Diagnosis not present

## 2023-02-14 DIAGNOSIS — E559 Vitamin D deficiency, unspecified: Secondary | ICD-10-CM | POA: Diagnosis not present

## 2023-02-14 DIAGNOSIS — Z79891 Long term (current) use of opiate analgesic: Secondary | ICD-10-CM | POA: Diagnosis not present

## 2023-02-14 DIAGNOSIS — I4510 Unspecified right bundle-branch block: Secondary | ICD-10-CM | POA: Diagnosis not present

## 2023-02-14 DIAGNOSIS — Z7982 Long term (current) use of aspirin: Secondary | ICD-10-CM | POA: Diagnosis not present

## 2023-02-14 DIAGNOSIS — K5909 Other constipation: Secondary | ICD-10-CM | POA: Diagnosis not present

## 2023-02-14 DIAGNOSIS — L97412 Non-pressure chronic ulcer of right heel and midfoot with fat layer exposed: Secondary | ICD-10-CM | POA: Diagnosis not present

## 2023-02-14 DIAGNOSIS — J449 Chronic obstructive pulmonary disease, unspecified: Secondary | ICD-10-CM | POA: Diagnosis not present

## 2023-02-14 DIAGNOSIS — E538 Deficiency of other specified B group vitamins: Secondary | ICD-10-CM | POA: Diagnosis not present

## 2023-02-14 NOTE — Progress Notes (Signed)
  Care Coordination Note  02/14/2023 Name: SHELLBY SCHLINK MRN: [id number] DOB: November 14, 1965  MICALAH CABEZAS is a 57 y.o. year old female who is a primary care patient of Plotnikov, Georgina Quint, MD and is actively engaged with the care management team. I reached out to Filbert Schilder by phone today to assist with re-scheduling an initial visit with the RN Case Manager  Follow up plan: Pt wasn't at home - will call back to reschedule   Burman Nieves, [facility] Care Coordination Care Guide Direct Dial: [phone]

## 2023-02-15 ENCOUNTER — Ambulatory Visit (INDEPENDENT_AMBULATORY_CARE_PROVIDER_SITE_OTHER): Payer: 59 | Admitting: Internal Medicine

## 2023-02-15 ENCOUNTER — Encounter: Payer: Self-pay | Admitting: Internal Medicine

## 2023-02-15 VITALS — BP 140/60 | HR 95 | Temp 97.9°F | Ht 69.0 in | Wt 209.0 lb

## 2023-02-15 DIAGNOSIS — G8929 Other chronic pain: Secondary | ICD-10-CM

## 2023-02-15 DIAGNOSIS — E538 Deficiency of other specified B group vitamins: Secondary | ICD-10-CM

## 2023-02-15 DIAGNOSIS — M5440 Lumbago with sciatica, unspecified side: Secondary | ICD-10-CM

## 2023-02-15 DIAGNOSIS — E1142 Type 2 diabetes mellitus with diabetic polyneuropathy: Secondary | ICD-10-CM

## 2023-02-15 DIAGNOSIS — Z794 Long term (current) use of insulin: Secondary | ICD-10-CM | POA: Diagnosis not present

## 2023-02-15 MED ORDER — OXYCODONE HCL 15 MG PO TABS: 15.0000 mg | ORAL_TABLET | Freq: Four times a day (QID) | ORAL | 0 refills | Status: DC | PRN

## 2023-02-15 NOTE — Progress Notes (Signed)
Subjective:  Patient ID: Katherine Herrera, female    DOB: 12/16/1965  Age: 57 y.o. MRN: [id number]  CC: No chief complaint on file.   HPI DELETHA GOERING presents for chronic pain Post-hosp f/u  Outpatient Medications Prior to Visit  Medication Sig Dispense Refill   albuterol (VENTOLIN HFA) 108 (90 Base) MCG/ACT inhaler Inhale 1 puff into the lungs every 6 (six) hours as needed for shortness of breath.     aspirin 325 MG tablet Take 325 mg by mouth daily.     B-D INS SYR ULTRAFINE 1CC/31G 31G X 5/16" 1 ML MISC USE AS DIRECTED 100 each 0   bisacodyl (DULCOLAX) 5 MG EC tablet Take 10 mg by mouth at bedtime.     Blood Glucose Monitoring Suppl (ONETOUCH VERIO) w/Device KIT      cholecalciferol (VITAMIN D3) 25 MCG (1000 UNIT) tablet Take 1,000 Units by mouth daily.     Continuous Blood Gluc Sensor (FREESTYLE LIBRE 2 SENSOR) MISC as directed subcutaneous every 14 days     docusate sodium (COLACE) 100 MG capsule Take 100 mg by mouth at bedtime.     ezetimibe (ZETIA) 10 MG tablet TAKE 1 TABLET BY MOUTH DAILY (Patient taking differently: Take 10 mg by mouth daily.) 100 tablet 2   FLUoxetine (PROZAC) 10 MG capsule TAKE 1 CAPSULE BY MOUTH EVERY DAY (Patient taking differently: Take 10 mg by mouth daily.) 90 capsule 2   gabapentin (NEURONTIN) 800 MG tablet TAKE 1 TABLET BY MOUTH 4 TIMES  DAILY (Patient taking differently: Take 800 mg by mouth 3 (three) times daily.) 400 tablet 2   insulin NPH-regular Human (70-30) 100 UNIT/ML injection Inject 20 Units into the skin 2 (two) times daily with a meal. (Patient taking differently: Inject 30-50 Units into the skin See admin instructions. Inject 30 units SQ in the morning and afternoon then inject 50 units SQ at night) 10 mL 11   lamoTRIgine (LAMICTAL) 25 MG tablet TAKE 1 TABLET BY MOUTH TWICE  DAILY (Patient taking differently: Take 25 mg by mouth 2 (two) times daily.) 200 tablet 2   Lancet Devices (ACCU-CHEK SOFTCLIX) lancets Test two times daily 100 each  12   Lancets (ONETOUCH DELICA PLUS LANCET33G) MISC      levothyroxine (SYNTHROID) 75 MCG tablet TAKE 1 TABLET BY MOUTH DAILY  BEFORE BREAKFAST 100 tablet 2   lisinopril (ZESTRIL) 10 MG tablet TAKE 1 TABLET BY MOUTH DAILY (Patient taking differently: Take 10 mg by mouth daily.) 100 tablet 2   ondansetron (ZOFRAN) 4 MG tablet Take 4 mg by mouth every 8 (eight) hours as needed for nausea or vomiting.     ONETOUCH VERIO test strip 1 each 3 (three) times daily.     rOPINIRole (REQUIP) 0.5 MG tablet TAKE 1 TABLET BY MOUTH AT  BEDTIME 100 tablet 2   sodium hypochlorite (DAKIN'S 1/2 STRENGTH) external solution Irrigate with 1 Application as directed daily. 437 mL 0   vitamin B-12 (CYANOCOBALAMIN) 1000 MCG tablet Take 1,000 mcg by mouth daily.     baclofen (LIORESAL) 10 MG tablet TAKE 2 TABLETS BY MOUTH TWICE  DAILY 180 tablet 1   oxyCODONE (ROXICODONE) 15 MG immediate release tablet Take 1 tablet (15 mg total) by mouth every 6 (six) hours as needed for pain. 120 tablet 0   No facility-administered medications prior to visit.    ROS: Review of Systems  Constitutional:  Negative for activity change, appetite change, chills, fatigue and unexpected weight change.  HENT:  Negative for congestion, mouth sores and sinus pressure.   Eyes:  Negative for visual disturbance.  Respiratory:  Negative for cough and chest tightness.   Gastrointestinal:  Negative for abdominal pain and nausea.  Genitourinary:  Negative for difficulty urinating, frequency and vaginal pain.  Musculoskeletal:  Positive for arthralgias, back pain and gait problem.  Skin:  Negative for pallor and rash.  Neurological:  Negative for dizziness, tremors, weakness, numbness and headaches.  Psychiatric/Behavioral:  Negative for confusion and sleep disturbance.     Objective:  BP (!) 140/60 (BP Location: Left Arm, Patient Position: Sitting, Cuff Size: Normal)   Pulse 95   Temp 97.9 F (36.6 C) (Oral)   Ht 5\' 9"  (1.753 m)   Wt 209 lb  (94.8 kg)   SpO2 94%   BMI 30.86 kg/m   BP Readings from Last 3 Encounters:  02/15/23 (!) 140/60  01/25/23 135/66  11/16/22 122/62    Wt Readings from Last 3 Encounters:  02/15/23 209 lb (94.8 kg)  01/23/23 201 lb 1 oz (91.2 kg)  01/02/23 205 lb (93 kg)    Physical Exam Constitutional:      General: She is not in acute distress.    Appearance: She is well-developed. She is obese.  HENT:     Head: Normocephalic.     Right Ear: External ear normal.     Left Ear: External ear normal.     Nose: Nose normal.  Eyes:     General:        Right eye: No discharge.        Left eye: No discharge.     Conjunctiva/sclera: Conjunctivae normal.     Pupils: Pupils are equal, round, and reactive to light.  Neck:     Thyroid: No thyromegaly.     Vascular: No JVD.     Trachea: No tracheal deviation.  Cardiovascular:     Rate and Rhythm: Normal rate and regular rhythm.     Heart sounds: Normal heart sounds.  Pulmonary:     Effort: No respiratory distress.     Breath sounds: No stridor. No wheezing.  Abdominal:     General: Bowel sounds are normal. There is no distension.     Palpations: Abdomen is soft. There is no mass.     Tenderness: There is no abdominal tenderness. There is no guarding or rebound.  Musculoskeletal:        General: Tenderness present.     Cervical back: Normal range of motion and neck supple. No rigidity.  Lymphadenopathy:     Cervical: No cervical adenopathy.  Skin:    Findings: No erythema or rash.  Neurological:     Mental Status: She is oriented to person, place, and time.     Cranial Nerves: No cranial nerve deficit.     Motor: No abnormal muscle tone.     Coordination: Coordination normal.     Deep Tendon Reflexes: Reflexes normal.  Psychiatric:        Behavior: Behavior normal.        Thought Content: Thought content normal.        Judgment: Judgment normal.   LS spine with pain  Lab Results  Component Value Date   WBC 8.1 01/25/2023   HGB  12.2 01/25/2023   HCT 37.4 01/25/2023   PLT 227 01/25/2023   GLUCOSE 256 (H) 01/25/2023   CHOL 215 (H) 02/23/2022   TRIG 301.0 (H) 02/23/2022   HDL 34.70 (L) 02/23/2022   LDLDIRECT  135.0 02/23/2022   LDLCALC 81 01/02/2013   ALT 16 01/22/2023   AST 17 01/22/2023   NA 132 (L) 01/25/2023   K 4.7 01/25/2023   CL 100 01/25/2023   CREATININE 1.13 (H) 01/25/2023   BUN 26 (H) 01/25/2023   CO2 26 01/25/2023   TSH 0.99 09/09/2021   INR 1.0 07/19/2019   HGBA1C 10.6 (H) 01/23/2023   MICROALBUR 0.9 07/19/2016    MR FOOT RIGHT W WO CONTRAST  Result Date: 01/23/2023 CLINICAL DATA:  Foot swelling, diabetic, osteomyelitis suspected, xray done lateral midfoot ulcer EXAM: MRI OF THE RIGHT FOREFOOT WITHOUT AND WITH CONTRAST TECHNIQUE: Multiplanar, multisequence MR imaging of the right forefoot was performed before and after the administration of intravenous contrast. CONTRAST:  8mL GADAVIST GADOBUTROL 1 MMOL/ML IV SOLN COMPARISON:  Multiple prior right foot radiographs, most recently 01/22/2023, MRI 03/11/2022 FINDINGS: Bones/Joint/Cartilage Postsurgical changes of prior fifth metatarsal base resection. There are sclerotic changes within the residual base of fifth metatarsal with mild marrow edema and enhancement. There is also mild marrow edema and enhancement within the adjacent lateral aspect of the cuboid with chronic cortical erosion of the cuboid. Marrow signal abnormality is slightly worsened in comparison to prior MRI in May 2023. Ligaments Ankle ligaments are poorly evaluated due to scan obliquity. Muscles and Tendons Postsurgical changes of the peroneal brevis. Peroneal longus tendon appears intact. Soft tissues Lateral midfoot ulcer with adjacent skin thickening and underlying soft tissue swelling. There is no organized fluid collection. IMPRESSION: Lateral midfoot ulcer with postsurgical changes of prior base of fifth metatarsal resection. Chronic osseous changes in the residual base of fifth  metatarsal and lateral cuboid, with underlying marrow edema and mild enhancement suggesting reactive marrow change or early acute on chronic osteomyelitis. No evidence of soft tissue abscess. Electronically Signed   By: Caprice Renshaw M.D.   On: 01/23/2023 15:48   DG Tibia/Fibula Right  Result Date: 01/22/2023 CLINICAL DATA:  Ulcer and infection EXAM: RIGHT TIBIA AND FIBULA - 2 VIEW COMPARISON:  None Available. FINDINGS: Diffuse bone demineralization. No evidence of acute fracture or dislocation. No focal bone lesion or bone erosion. Soft tissues are unremarkable. IMPRESSION: No acute bony abnormalities. Electronically Signed   By: Burman Nieves M.D.   On: 01/22/2023 22:47   DG Foot Complete Right  Result Date: 01/22/2023 CLINICAL DATA:  Ulcer and infection. EXAM: RIGHT FOOT COMPLETE - 3+ VIEW COMPARISON:  04/08/2022 FINDINGS: Diffuse bone demineralization. Previous resection or resorption of the distal right fifth metatarsal bone, unchanged. Bone erosion and sclerosis involving the base of the fifth metatarsal in the adjacent cuboidal bone likely representing osteomyelitis. Appearance is similar to prior study. No new areas of bone resorption or appreciated. Degenerative changes in the interphalangeal and intertarsal joints. No radiopaque soft tissue foreign bodies or soft tissue gas. IMPRESSION: Changes of chronic osteomyelitis suggested at the base of the fifth metatarsal and adjacent cuboidal bone similar to prior study. Soft tissue changes are improving since the prior study. Electronically Signed   By: Burman Nieves M.D.   On: 01/22/2023 22:46    Assessment & Plan:   Problem List Items Addressed This Visit     B12 deficiency - Primary    On B12      LOW BACK PAIN    On Oxycodone 15 mg qid prn  Potential benefits of a long term opioids use as well as potential risks (i.e. addiction risk, apnea etc) and complications (i.e. Somnolence, constipation and others) were explained to the patient  and were aknowledged.       Relevant Medications   oxyCODONE (ROXICODONE) 15 MG immediate release tablet   Polyneuropathy due to type 2 diabetes mellitus (HCC) (Chronic)    CBGs are better on Libre 2         Meds ordered this encounter  Medications   oxyCODONE (ROXICODONE) 15 MG immediate release tablet    Sig: Take 1 tablet (15 mg total) by mouth every 6 (six) hours as needed for pain.    Dispense:  120 tablet    Refill:  0    Please fill on or after 02/17/23      Follow-up: Return in about 3 months (around 05/17/2023) for a follow-up visit.  Sonda Primes, MD

## 2023-02-16 ENCOUNTER — Other Ambulatory Visit: Payer: Self-pay | Admitting: Internal Medicine

## 2023-02-20 ENCOUNTER — Other Ambulatory Visit: Payer: Self-pay | Admitting: Podiatry

## 2023-02-20 DIAGNOSIS — L97412 Non-pressure chronic ulcer of right heel and midfoot with fat layer exposed: Secondary | ICD-10-CM | POA: Diagnosis not present

## 2023-02-20 DIAGNOSIS — Z79891 Long term (current) use of opiate analgesic: Secondary | ICD-10-CM | POA: Diagnosis not present

## 2023-02-20 DIAGNOSIS — K5909 Other constipation: Secondary | ICD-10-CM | POA: Diagnosis not present

## 2023-02-20 DIAGNOSIS — E039 Hypothyroidism, unspecified: Secondary | ICD-10-CM | POA: Diagnosis not present

## 2023-02-20 DIAGNOSIS — F17210 Nicotine dependence, cigarettes, uncomplicated: Secondary | ICD-10-CM | POA: Diagnosis not present

## 2023-02-20 DIAGNOSIS — E11621 Type 2 diabetes mellitus with foot ulcer: Secondary | ICD-10-CM | POA: Diagnosis not present

## 2023-02-20 DIAGNOSIS — G894 Chronic pain syndrome: Secondary | ICD-10-CM | POA: Diagnosis not present

## 2023-02-20 DIAGNOSIS — Z7982 Long term (current) use of aspirin: Secondary | ICD-10-CM | POA: Diagnosis not present

## 2023-02-20 DIAGNOSIS — I4510 Unspecified right bundle-branch block: Secondary | ICD-10-CM | POA: Diagnosis not present

## 2023-02-20 DIAGNOSIS — M545 Low back pain, unspecified: Secondary | ICD-10-CM | POA: Diagnosis not present

## 2023-02-20 DIAGNOSIS — J449 Chronic obstructive pulmonary disease, unspecified: Secondary | ICD-10-CM | POA: Diagnosis not present

## 2023-02-20 DIAGNOSIS — E559 Vitamin D deficiency, unspecified: Secondary | ICD-10-CM | POA: Diagnosis not present

## 2023-02-20 DIAGNOSIS — G43909 Migraine, unspecified, not intractable, without status migrainosus: Secondary | ICD-10-CM | POA: Diagnosis not present

## 2023-02-20 DIAGNOSIS — M1990 Unspecified osteoarthritis, unspecified site: Secondary | ICD-10-CM | POA: Diagnosis not present

## 2023-02-20 DIAGNOSIS — Z794 Long term (current) use of insulin: Secondary | ICD-10-CM | POA: Diagnosis not present

## 2023-02-20 DIAGNOSIS — E538 Deficiency of other specified B group vitamins: Secondary | ICD-10-CM | POA: Diagnosis not present

## 2023-02-20 DIAGNOSIS — E1142 Type 2 diabetes mellitus with diabetic polyneuropathy: Secondary | ICD-10-CM | POA: Diagnosis not present

## 2023-02-20 DIAGNOSIS — E785 Hyperlipidemia, unspecified: Secondary | ICD-10-CM | POA: Diagnosis not present

## 2023-02-21 NOTE — Progress Notes (Signed)
  Care Coordination Note  02/21/2023 Name: Katherine Herrera MRN: [id number] DOB: 18-Feb-1966  Katherine Herrera is a 57 y.o. year old female who is a primary care patient of Plotnikov, Georgina Quint, MD and is actively engaged with the care management team. I reached out to Filbert Schilder by phone today to assist with re-scheduling a follow up visit with the RN Case Manager  Follow up plan: We have been unable to make contact with the patient for follow up.   Burman Nieves, CCMA Care Coordination Care Guide Direct Dial: [phone]

## 2023-02-22 ENCOUNTER — Telehealth: Payer: Self-pay

## 2023-02-22 NOTE — Telephone Encounter (Signed)
DOS 03/20/2023  TENDO-ACHILLES LENGTHENING RT - 16109 TENODESIS RT - 27691   [facility] EFFECTIVE DATE - 10/31/2022  PLAN DEDUCTIBLE - $240.00 W/$0.00 REMAINING OUT OF POCKET - $8850.00 W/ $6045.40 REMAINING  CO-INSURANCE 20% / Day OUTPATIENT SURGERY 20% / Day OUTPATIENT HOSPITAL COPAY $0 / Day OUTPATIENT SURGERY $0 / Day OUTPATIENT HOSPITAL   Notification or Prior Authorization is not required for the requested services You are not required to submit a notification/prior authorization based on the information provided. If you have general questions about the prior authorization requirements, visit [email or web address] > Clinician Resources > Advance and Admission Notification Requirements. The number above acknowledges your notification. Please write this reference number down for future reference. If you would like to request an organization determination, please call us at [phone]. Decision ID #: [id number]

## 2023-02-27 DIAGNOSIS — M86671 Other chronic osteomyelitis, right ankle and foot: Secondary | ICD-10-CM | POA: Diagnosis not present

## 2023-02-28 ENCOUNTER — Ambulatory Visit (INDEPENDENT_AMBULATORY_CARE_PROVIDER_SITE_OTHER): Payer: 59 | Admitting: Podiatry

## 2023-02-28 DIAGNOSIS — L97412 Non-pressure chronic ulcer of right heel and midfoot with fat layer exposed: Secondary | ICD-10-CM | POA: Diagnosis not present

## 2023-02-28 DIAGNOSIS — M86671 Other chronic osteomyelitis, right ankle and foot: Secondary | ICD-10-CM | POA: Diagnosis not present

## 2023-02-28 DIAGNOSIS — E11621 Type 2 diabetes mellitus with foot ulcer: Secondary | ICD-10-CM

## 2023-02-28 DIAGNOSIS — E08621 Diabetes mellitus due to underlying condition with foot ulcer: Secondary | ICD-10-CM | POA: Diagnosis not present

## 2023-02-28 DIAGNOSIS — L02611 Cutaneous abscess of right foot: Secondary | ICD-10-CM | POA: Diagnosis not present

## 2023-02-28 NOTE — Progress Notes (Signed)
Chief Complaint  Patient presents with   Foot Ulcer    Right foot ulcer, with drainage, patient denies any pain,     HPI: 57 y.o. female presenting today after being discharged from the hospital for cellulitis of the right leg.  Patient has a chronic wound to the lateral aspect of the right foot complicated by cavovarus foot deformity.  Currently on oral antibiotics that were prescribed upon discharge.  WBAT in a [employer] offloading cam walker.  Presenting for outpatient follow-up  Past Medical History:  Diagnosis Date   Anxiety    Arthritis    hands   B12 deficiency    Chronic constipation    Chronic pain syndrome    neck, lower back, and foot   COPD (chronic obstructive pulmonary disease) (HCC)    Diabetic ulcer of right foot (HCC)    Gout    possible   History of seizure    05-26-2020  per pt as child and last one in late teen's   History of sepsis    due to lower extremity cellulits 2019 left , right 03/ 2021   History of syncope    Hyperlipidemia    Hypothyroidism    Impaired range of motion of cervical spine    per pt hx cervical fusion C4 -- 7,  has to be careful about turning her head to the right can cause her to pass out   Insulin dependent type 2 diabetes mellitus [facility])    endocrinologist--- dr Talmage Nap    (05-26-2020 per pt check's blood sugar twice dialy and at times a third time,  fasting am blood sugar's---- 200 -- 250)   LBP (low back pain)    DR Noel Gerold   Migraine    Peripheral neuropathy    RBBB (right bundle branch block)    Tenosynovitis of foot and ankle 09/03/2013   TTS (tarsal tunnel syndrome) 2010   Left Foot [facility] in [place]   Vitamin D deficiency    Wears glasses     Past Surgical History:  Procedure Laterality Date   ANTERIOR CERVICAL DECOMP/DISCECTOMY FUSION  02-12-2004;  07-06-2006   C4--5 in 2005;  C5 --7  in  2007   APPLICATION OF WOUND VAC Right 07/05/2019   Procedure: Application Of Wound Vac;  Surgeon: Park Liter, DPM;  Location: [facility];   Service: Podiatry;  Laterality: Right;   APPLICATION OF WOUND VAC Right 04/10/2022   Procedure: APPLICATION OF WOUND VAC, PLACEMENT OF ANTIBIOTIC BEADS;  Surgeon: Edwin Cap, DPM;  Location: [facility];  Service: Podiatry;  Laterality: Right;   BONE BIOPSY Right 05/29/2020   Procedure: BONE BIOPSY;  Surgeon: Park Liter, DPM;  Location: [facility];  Service: Podiatry;  Laterality: Right;   CARDIAC CATHETERIZATION  05-27-2002   dr Jacinto Halim   for positive cardiolite;  normal coronaries and LVF, ef 70%   CARPAL TUNNEL RELEASE Bilateral left 12-25-2008;  right 09-22-2009  both @[facility]    HARDWARE REMOVAL  01-07-2016  @[facility]    right foot   INCISION AND DRAINAGE OF WOUND Right 07/20/2019   Procedure: IRRIGATION AND DEBRIDEMENT foot;  Surgeon: Park Liter, DPM;  Location: [facility];  Service: Podiatry;  Laterality: Right;   INCISION AND DRAINAGE OF WOUND Right 07/22/2019   Procedure: IRRIGATION AND DEBRIDEMENT RIGHT FOOT; APPLICATION OF WOUND VAC;  Surgeon: Park Liter, DPM;  Location: [facility];  Service: Podiatry;  Laterality: Right;   INCISION AND DRAINAGE OF WOUND Right 07/24/2019  Procedure: IRRIGATION AND DEBRIDEMENT WOUND, APPLICATION OF WOUND VAC;  Surgeon: Park Liter, DPM;  Location: [facility];  Service: Podiatry;  Laterality: Right;   INCISION AND DRAINAGE OF WOUND Right 05/29/2020   Procedure: RIGHT FOOT WOUND DEBRIDEMENT AND IRRIGATION;  Surgeon: Park Liter, DPM;  Location: [facility];  Service: Podiatry;  Laterality: Right;   INCISION AND DRAINAGE OF WOUND Right 04/13/2022   Procedure: IRRIGATION AND DEBRIDEMENT WOUND;  Surgeon: Candelaria Stagers, DPM;  Location: [facility];  Service: Podiatry;  Laterality: Right;   IRRIGATION AND DEBRIDEMENT FOOT Right 04/10/2022   Procedure: IRRIGATION AND DEBRIDEMENT FOOT;  Surgeon: Edwin Cap, DPM;  Location: [facility];  Service: Podiatry;  Laterality: Right;   LUMBAR DISC SURGERY  07-07-2005   @[facility]    L5 - S1    METATARSAL HEAD EXCISION Bilateral 01/02/2019   Procedure: Left foot 5th metatarsal resection, wound excision, adjacent tissue transfer. Right fifth metatarsal head resection, fitfth metatarsal base resection, intermediate wound repair.;  Surgeon: Park Liter, DPM;  Location: [facility];  Service: Podiatry;  Laterality: Bilateral;   METATARSAL HEAD EXCISION Right 05/12/2021   Procedure: Right 5th met base resection;  Surgeon: Park Liter, DPM;  Location: [facility];  Service: Podiatry;  Laterality: Right;   METATARSAL OSTEOTOMY Right 07/05/2019   Procedure: RIGHT FOOT 5TH METATARSAL RESECTION; PERONEAL TENDONESIS, EXCISION OF ULCER, LOCAL TISSUE TRANSFER ;  Surgeon: Park Liter, DPM;  Location: [facility];  Service: Podiatry;  Laterality: Right;   ORIF METATARSAL FRACTURE  09-22-2015  @[facility]    right 5th   TOTAL ABDOMINAL HYSTERECTOMY W/ BILATERAL SALPINGOOPHORECTOMY  2000 approx.    Allergies  Allergen Reactions   Cephalexin Nausea And Vomiting   Demerol  [Meperidine Hcl] Rash   Lovastatin Other (See Comments)    Possible myalgia    Metformin And Related Nausea And Vomiting   Sulfamethoxazole-Trimethoprim     Other reaction(s): Unknown DILI, pancreatitis   Crestor [Rosuvastatin Calcium]     myalgia   Hydromorphone     Other reaction(s): Unknown   Niacin Other (See Comments)    Unknown    Other Other (See Comments)   Hydromorphone Hcl Itching    Patient has been tolerating Hydromorphone tablets without adverse effect (07/09/19) Other reaction(s): Unknown   Paroxetine Other (See Comments)    Unknown      RT foot 02/06/2023  Physical Exam: General: The patient is alert and oriented x3 in no acute distress.  Dermatology: Ulcer noted plantar aspect of the right foot around the fifth metatarsal tubercle area.  Granular wound base.  Serous drainage.  No malodor.  No erythema around the foot.  Maceration with hyperkeratotic debris periwound.  There is no exposed bone muscle  tendon ligament or joint  Vascular: Palpable pedal pulses bilaterally. Capillary refill within normal limits.  Negative for any significant edema or erythema  Neurological: Light touch and protective threshold diminished  Musculoskeletal Exam: cavovarus foot type noted with excessive lateral column loading  Radiographic Exam RT foot 01/22/2023:  FINDINGS: Diffuse bone demineralization. Previous resection or resorption of the distal right fifth metatarsal bone, unchanged. Bone erosion and sclerosis involving the base of the fifth metatarsal in the adjacent cuboidal bone likely representing osteomyelitis. Appearance is similar to prior study. No new areas of bone resorption or appreciated. Degenerative changes in the interphalangeal and intertarsal joints. No radiopaque soft tissue foreign bodies or soft tissue gas.   IMPRESSION: Changes of chronic osteomyelitis suggested at the  base of the fifth metatarsal and adjacent cuboidal bone similar to prior study. Soft tissue changes are improving since the prior study.  MR FOOT RIGHT W WO CONTRAST 01/23/2023: IMPRESSION: Lateral midfoot ulcer with postsurgical changes of prior base of fifth metatarsal resection. Chronic osseous changes in the residual base of fifth metatarsal and lateral cuboid, with underlying marrow edema and mild enhancement suggesting reactive marrow change or early acute on chronic osteomyelitis. No evidence of soft tissue abscess.  Assessment: 1.  Ulcer fifth metatarsal tubercle right foot 2.  Varus foot type with lateral column loading right foot  -Patient evaluated -Continue oral antibiotics prescribed at discharge until completed -Continue WBAT offloading defender cam walker -Medically necessary excisional debridement including subcutaneous tissue was performed using a tissue nipper.  Excisional debridement of the necrotic nonviable tissue down to healthier bleeding viable tissue was performed with  postdebridement measurement same as pre- -Continue Betadine wet-to-dry dressings - Surgery scheduled in 3 weeks see preoperative H&P 12/28/2022

## 2023-03-04 ENCOUNTER — Encounter: Payer: Self-pay | Admitting: Internal Medicine

## 2023-03-04 NOTE — Assessment & Plan Note (Signed)
CBGs are better on Libre 2 ?

## 2023-03-04 NOTE — Assessment & Plan Note (Signed)
On B12 

## 2023-03-04 NOTE — Assessment & Plan Note (Signed)
On Oxycodone 15 mg qid prn  Potential benefits of a long term opioids use as well as potential risks (i.e. addiction risk, apnea etc) and complications (i.e. Somnolence, constipation and others) were explained to the patient and were aknowledged.  

## 2023-03-08 ENCOUNTER — Encounter: Payer: Self-pay | Admitting: Internal Medicine

## 2023-03-08 ENCOUNTER — Encounter: Payer: 59 | Admitting: Podiatry

## 2023-03-08 ENCOUNTER — Ambulatory Visit (INDEPENDENT_AMBULATORY_CARE_PROVIDER_SITE_OTHER): Payer: 59 | Admitting: Internal Medicine

## 2023-03-08 VITALS — BP 128/72 | HR 80 | Temp 98.7°F | Ht 69.0 in | Wt 204.0 lb

## 2023-03-08 DIAGNOSIS — M5412 Radiculopathy, cervical region: Secondary | ICD-10-CM

## 2023-03-08 MED ORDER — METHYLPREDNISOLONE 4 MG PO TBPK: ORAL_TABLET | ORAL | 0 refills | Status: DC

## 2023-03-08 MED ORDER — CELECOXIB 200 MG PO CAPS: ORAL_CAPSULE | ORAL | 0 refills | Status: DC

## 2023-03-08 NOTE — Progress Notes (Signed)
Subjective:  Patient ID: Katherine Herrera, female    DOB: 07/08/1966  Age: 57 y.o. MRN: [id number]  CC: No chief complaint on file.   HPI Katherine Herrera presents for R neck and R shoulder pain x 2 wks - severe. Burning pain shoots down the R arm. L shoulder hurts too...  Outpatient Medications Prior to Visit  Medication Sig Dispense Refill   albuterol (VENTOLIN HFA) 108 (90 Base) MCG/ACT inhaler Inhale 1 puff into the lungs every 6 (six) hours as needed for shortness of breath.     aspirin 325 MG tablet Take 325 mg by mouth daily.     B-D INS SYR ULTRAFINE 1CC/31G 31G X 5/16" 1 ML MISC USE AS DIRECTED 100 each 0   baclofen (LIORESAL) 10 MG tablet TAKE 2 TABLETS BY MOUTH TWICE  DAILY 180 tablet 1   bisacodyl (DULCOLAX) 5 MG EC tablet Take 10 mg by mouth at bedtime.     Blood Glucose Monitoring Suppl (ONETOUCH VERIO) w/Device KIT      cholecalciferol (VITAMIN D3) 25 MCG (1000 UNIT) tablet Take 1,000 Units by mouth daily.     Continuous Blood Gluc Sensor (FREESTYLE LIBRE 2 SENSOR) MISC as directed subcutaneous every 14 days     docusate sodium (COLACE) 100 MG capsule Take 100 mg by mouth at bedtime.     ezetimibe (ZETIA) 10 MG tablet TAKE 1 TABLET BY MOUTH DAILY (Patient taking differently: Take 10 mg by mouth daily.) 100 tablet 2   FLUoxetine (PROZAC) 10 MG capsule TAKE 1 CAPSULE BY MOUTH EVERY DAY (Patient taking differently: Take 10 mg by mouth daily.) 90 capsule 2   gabapentin (NEURONTIN) 800 MG tablet TAKE 1 TABLET BY MOUTH 4 TIMES  DAILY (Patient taking differently: Take 800 mg by mouth 3 (three) times daily.) 400 tablet 2   insulin NPH-regular Human (70-30) 100 UNIT/ML injection Inject 20 Units into the skin 2 (two) times daily with a meal. (Patient taking differently: Inject 30-50 Units into the skin See admin instructions. Inject 30 units SQ in the morning and afternoon then inject 50 units SQ at night) 10 mL 11   lamoTRIgine (LAMICTAL) 25 MG tablet TAKE 1 TABLET BY MOUTH TWICE   DAILY (Patient taking differently: Take 25 mg by mouth 2 (two) times daily.) 200 tablet 2   Lancet Devices (ACCU-CHEK SOFTCLIX) lancets Test two times daily 100 each 12   Lancets (ONETOUCH DELICA PLUS LANCET33G) MISC      levothyroxine (SYNTHROID) 75 MCG tablet TAKE 1 TABLET BY MOUTH DAILY  BEFORE BREAKFAST 100 tablet 2   lisinopril (ZESTRIL) 10 MG tablet TAKE 1 TABLET BY MOUTH DAILY (Patient taking differently: Take 10 mg by mouth daily.) 100 tablet 2   ondansetron (ZOFRAN) 4 MG tablet Take 4 mg by mouth every 8 (eight) hours as needed for nausea or vomiting.     ONETOUCH VERIO test strip 1 each 3 (three) times daily.     oxyCODONE (ROXICODONE) 15 MG immediate release tablet Take 1 tablet (15 mg total) by mouth every 6 (six) hours as needed for pain. 120 tablet 0   rOPINIRole (REQUIP) 0.5 MG tablet TAKE 1 TABLET BY MOUTH AT  BEDTIME 100 tablet 2   sodium hypochlorite (DAKIN'S 1/2 STRENGTH) external solution Irrigate with 1 Application as directed daily. 437 mL 0   vitamin B-12 (CYANOCOBALAMIN) 1000 MCG tablet Take 1,000 mcg by mouth daily.     No facility-administered medications prior to visit.    ROS: Review of Systems  Objective:  BP 128/72 (BP Location: Left Arm, Patient Position: Sitting, Cuff Size: Normal)   Pulse 80   Temp 98.7 F (37.1 C) (Oral)   Ht 5\' 9"  (1.753 m)   Wt 204 lb (92.5 kg)   SpO2 99%   BMI 30.13 kg/m   BP Readings from Last 3 Encounters:  03/08/23 128/72  02/15/23 (!) 140/60  01/25/23 135/66    Wt Readings from Last 3 Encounters:  03/08/23 204 lb (92.5 kg)  02/15/23 209 lb (94.8 kg)  01/23/23 201 lb 1 oz (91.2 kg)    Physical Exam  Lab Results  Component Value Date   WBC 8.1 01/25/2023   HGB 12.2 01/25/2023   HCT 37.4 01/25/2023   PLT 227 01/25/2023   GLUCOSE 256 (H) 01/25/2023   CHOL 215 (H) 02/23/2022   TRIG 301.0 (H) 02/23/2022   HDL 34.70 (L) 02/23/2022   LDLDIRECT 135.0 02/23/2022   LDLCALC 81 01/02/2013   ALT 16 01/22/2023   AST 17  01/22/2023   NA 132 (L) 01/25/2023   K 4.7 01/25/2023   CL 100 01/25/2023   CREATININE 1.13 (H) 01/25/2023   BUN 26 (H) 01/25/2023   CO2 26 01/25/2023   TSH 0.99 09/09/2021   INR 1.0 07/19/2019   HGBA1C 10.6 (H) 01/23/2023   MICROALBUR 0.9 07/19/2016    MR FOOT RIGHT W WO CONTRAST  Result Date: 01/23/2023 CLINICAL DATA:  Foot swelling, diabetic, osteomyelitis suspected, xray done lateral midfoot ulcer EXAM: MRI OF THE RIGHT FOREFOOT WITHOUT AND WITH CONTRAST TECHNIQUE: Multiplanar, multisequence MR imaging of the right forefoot was performed before and after the administration of intravenous contrast. CONTRAST:  8mL GADAVIST GADOBUTROL 1 MMOL/ML IV SOLN COMPARISON:  Multiple prior right foot radiographs, most recently 01/22/2023, MRI 03/11/2022 FINDINGS: Bones/Joint/Cartilage Postsurgical changes of prior fifth metatarsal base resection. There are sclerotic changes within the residual base of fifth metatarsal with mild marrow edema and enhancement. There is also mild marrow edema and enhancement within the adjacent lateral aspect of the cuboid with chronic cortical erosion of the cuboid. Marrow signal abnormality is slightly worsened in comparison to prior MRI in May 2023. Ligaments Ankle ligaments are poorly evaluated due to scan obliquity. Muscles and Tendons Postsurgical changes of the peroneal brevis. Peroneal longus tendon appears intact. Soft tissues Lateral midfoot ulcer with adjacent skin thickening and underlying soft tissue swelling. There is no organized fluid collection. IMPRESSION: Lateral midfoot ulcer with postsurgical changes of prior base of fifth metatarsal resection. Chronic osseous changes in the residual base of fifth metatarsal and lateral cuboid, with underlying marrow edema and mild enhancement suggesting reactive marrow change or early acute on chronic osteomyelitis. No evidence of soft tissue abscess. Electronically Signed   By: Caprice Renshaw M.D.   On: 01/23/2023 15:48   DG  Tibia/Fibula Right  Result Date: 01/22/2023 CLINICAL DATA:  Ulcer and infection EXAM: RIGHT TIBIA AND FIBULA - 2 VIEW COMPARISON:  None Available. FINDINGS: Diffuse bone demineralization. No evidence of acute fracture or dislocation. No focal bone lesion or bone erosion. Soft tissues are unremarkable. IMPRESSION: No acute bony abnormalities. Electronically Signed   By: Burman Nieves M.D.   On: 01/22/2023 22:47   DG Foot Complete Right  Result Date: 01/22/2023 CLINICAL DATA:  Ulcer and infection. EXAM: RIGHT FOOT COMPLETE - 3+ VIEW COMPARISON:  04/08/2022 FINDINGS: Diffuse bone demineralization. Previous resection or resorption of the distal right fifth metatarsal bone, unchanged. Bone erosion and sclerosis involving the base of the fifth metatarsal in the adjacent  cuboidal bone likely representing osteomyelitis. Appearance is similar to prior study. No new areas of bone resorption or appreciated. Degenerative changes in the interphalangeal and intertarsal joints. No radiopaque soft tissue foreign bodies or soft tissue gas. IMPRESSION: Changes of chronic osteomyelitis suggested at the base of the fifth metatarsal and adjacent cuboidal bone similar to prior study. Soft tissue changes are improving since the prior study. Electronically Signed   By: Burman Nieves M.D.   On: 01/22/2023 22:46    Assessment & Plan:   Problem List Items Addressed This Visit     Cervical radiculopathy - Primary     R>>L w/severe pain Will try Celebrex w/caution due to CRI If not better, use Medrol pack - (Hyperglycemia discussed)         Meds ordered this encounter  Medications   celecoxib (CELEBREX) 200 MG capsule    Sig: Use 1 po bid x 2 weeks, then 1 po bid prn pain with food.    Dispense:  60 capsule    Refill:  0   methylPREDNISolone (MEDROL DOSEPAK) 4 MG TBPK tablet    Sig: As directed    Dispense:  21 tablet    Refill:  0      Follow-up: Return in about 6 weeks (around 04/19/2023) for a  follow-up visit.  Sonda Primes, MD

## 2023-03-08 NOTE — Assessment & Plan Note (Signed)
R>>L w/severe pain Will try Celebrex w/caution due to CRI If not better, use Medrol pack - (Hyperglycemia discussed)

## 2023-03-09 DIAGNOSIS — E1165 Type 2 diabetes mellitus with hyperglycemia: Secondary | ICD-10-CM | POA: Diagnosis not present

## 2023-03-20 ENCOUNTER — Other Ambulatory Visit: Payer: Self-pay | Admitting: Podiatry

## 2023-03-20 ENCOUNTER — Telehealth: Payer: Self-pay | Admitting: Podiatry

## 2023-03-20 ENCOUNTER — Encounter: Payer: Self-pay | Admitting: Podiatry

## 2023-03-20 DIAGNOSIS — S86391A Other injury of muscle(s) and tendon(s) of peroneal muscle group at lower leg level, right leg, initial encounter: Secondary | ICD-10-CM | POA: Diagnosis not present

## 2023-03-20 DIAGNOSIS — M779 Enthesopathy, unspecified: Secondary | ICD-10-CM | POA: Diagnosis not present

## 2023-03-20 DIAGNOSIS — M24571 Contracture, right ankle: Secondary | ICD-10-CM | POA: Diagnosis not present

## 2023-03-20 DIAGNOSIS — E11621 Type 2 diabetes mellitus with foot ulcer: Secondary | ICD-10-CM | POA: Diagnosis not present

## 2023-03-20 DIAGNOSIS — M7671 Peroneal tendinitis, right leg: Secondary | ICD-10-CM | POA: Diagnosis not present

## 2023-03-20 DIAGNOSIS — M659 Synovitis and tenosynovitis, unspecified: Secondary | ICD-10-CM | POA: Diagnosis not present

## 2023-03-20 DIAGNOSIS — M216X1 Other acquired deformities of right foot: Secondary | ICD-10-CM | POA: Diagnosis not present

## 2023-03-20 MED ORDER — IBUPROFEN 800 MG PO TABS: 800.0000 mg | ORAL_TABLET | Freq: Four times a day (QID) | ORAL | 1 refills | Status: AC | PRN

## 2023-03-20 MED ORDER — OXYCODONE-ACETAMINOPHEN 5-325 MG PO TABS: 1.0000 | ORAL_TABLET | ORAL | 0 refills | Status: DC | PRN

## 2023-03-20 NOTE — Telephone Encounter (Signed)
Kim from [facility] in [place] is calling to talk with Dr. Allena Katz in regards to patient's pain medication.  Was prescribed Percocet today from surgery but she is on a higher dose of maintenance pain medication.  Please contact the pharmacy.  [facility] [phone]

## 2023-03-22 ENCOUNTER — Encounter: Payer: 59 | Admitting: Podiatry

## 2023-03-29 ENCOUNTER — Ambulatory Visit: Payer: 59

## 2023-03-29 ENCOUNTER — Ambulatory Visit (INDEPENDENT_AMBULATORY_CARE_PROVIDER_SITE_OTHER): Payer: 59 | Admitting: Podiatry

## 2023-03-29 DIAGNOSIS — M216X1 Other acquired deformities of right foot: Secondary | ICD-10-CM

## 2023-03-29 DIAGNOSIS — M86671 Other chronic osteomyelitis, right ankle and foot: Secondary | ICD-10-CM | POA: Diagnosis not present

## 2023-03-29 DIAGNOSIS — Z9889 Other specified postprocedural states: Secondary | ICD-10-CM

## 2023-03-29 DIAGNOSIS — M24571 Contracture, right ankle: Secondary | ICD-10-CM

## 2023-03-29 DIAGNOSIS — M6780 Other specified disorders of synovium and tendon, unspecified site: Secondary | ICD-10-CM

## 2023-03-29 NOTE — Progress Notes (Signed)
Subjective:  Patient ID: Katherine Herrera, female    DOB: November 14, 1965,  MRN: [id number]  Chief Complaint  Patient presents with   Routine Post Op    POV # 1 DOS 02/27/23 --- RIGHT POSTERIOR TIBAL TENDON LENGTHENING WITH ACHILLES TENDON LENGTHENING WITH PERONEAL TENDON TRANSFER/NO NURSE SCHEDULE    DOS: 02/27/2023 Procedure: Right posterior tibial tendon lengthening with Achilles tendon lengthening with peroneal tendon tenodesis  57 y.o. female returns for post-op check.  Patient states she is doing well.  Cast is intact no signs of backing or loosening.  Motor or sensory functions intact.  Clean dry and intact cast  Review of Systems: Negative except as noted in the HPI. Denies N/V/F/Ch.  Past Medical History:  Diagnosis Date   Anxiety    Arthritis    hands   B12 deficiency    Chronic constipation    Chronic pain syndrome    neck, lower back, and foot   COPD (chronic obstructive pulmonary disease) (HCC)    Diabetic ulcer of right foot (HCC)    Gout    possible   History of seizure    05-26-2020  per pt as child and last one in late teen's   History of sepsis    due to lower extremity cellulits 2019 left , right 03/ 2021   History of syncope    Hyperlipidemia    Hypothyroidism    Impaired range of motion of cervical spine    per pt hx cervical fusion C4 -- 7,  has to be careful about turning her head to the right can cause her to pass out   Insulin dependent type 2 diabetes mellitus [facility])    endocrinologist--- dr Talmage Nap    (05-26-2020 per pt check's blood sugar twice dialy and at times a third time,  fasting am blood sugar's---- 200 -- 250)   LBP (low back pain)    DR Noel Gerold   Migraine    Peripheral neuropathy    RBBB (right bundle branch block)    Tenosynovitis of foot and ankle 09/03/2013   TTS (tarsal tunnel syndrome) 2010   Left Foot [facility] in [place]   Vitamin D deficiency    Wears glasses     Current Outpatient Medications:    albuterol (VENTOLIN HFA) 108 (90 Base)  MCG/ACT inhaler, Inhale 1 puff into the lungs every 6 (six) hours as needed for shortness of breath., Disp: , Rfl:    aspirin 325 MG tablet, Take 325 mg by mouth daily., Disp: , Rfl:    B-D INS SYR ULTRAFINE 1CC/31G 31G X 5/16" 1 ML MISC, USE AS DIRECTED, Disp: 100 each, Rfl: 0   baclofen (LIORESAL) 10 MG tablet, TAKE 2 TABLETS BY MOUTH TWICE  DAILY, Disp: 180 tablet, Rfl: 1   bisacodyl (DULCOLAX) 5 MG EC tablet, Take 10 mg by mouth at bedtime., Disp: , Rfl:    Blood Glucose Monitoring Suppl (ONETOUCH VERIO) w/Device KIT, , Disp: , Rfl:    celecoxib (CELEBREX) 200 MG capsule, Use 1 po bid x 2 weeks, then 1 po bid prn pain with food., Disp: 60 capsule, Rfl: 0   cholecalciferol (VITAMIN D3) 25 MCG (1000 UNIT) tablet, Take 1,000 Units by mouth daily., Disp: , Rfl:    Continuous Blood Gluc Sensor (FREESTYLE LIBRE 2 SENSOR) MISC, as directed subcutaneous every 14 days, Disp: , Rfl:    docusate sodium (COLACE) 100 MG capsule, Take 100 mg by mouth at bedtime., Disp: , Rfl:    ezetimibe (ZETIA) 10  MG tablet, TAKE 1 TABLET BY MOUTH DAILY (Patient taking differently: Take 10 mg by mouth daily.), Disp: 100 tablet, Rfl: 2   FLUoxetine (PROZAC) 10 MG capsule, TAKE 1 CAPSULE BY MOUTH EVERY DAY (Patient taking differently: Take 10 mg by mouth daily.), Disp: 90 capsule, Rfl: 2   gabapentin (NEURONTIN) 800 MG tablet, TAKE 1 TABLET BY MOUTH 4 TIMES  DAILY (Patient taking differently: Take 800 mg by mouth 3 (three) times daily.), Disp: 400 tablet, Rfl: 2   ibuprofen (ADVIL) 800 MG tablet, Take 1 tablet (800 mg total) by mouth every 6 (six) hours as needed., Disp: 60 tablet, Rfl: 1   insulin NPH-regular Human (70-30) 100 UNIT/ML injection, Inject 20 Units into the skin 2 (two) times daily with a meal. (Patient taking differently: Inject 30-50 Units into the skin See admin instructions. Inject 30 units SQ in the morning and afternoon then inject 50 units SQ at night), Disp: 10 mL, Rfl: 11   lamoTRIgine (LAMICTAL) 25 MG  tablet, TAKE 1 TABLET BY MOUTH TWICE  DAILY (Patient taking differently: Take 25 mg by mouth 2 (two) times daily.), Disp: 200 tablet, Rfl: 2   Lancet Devices (ACCU-CHEK SOFTCLIX) lancets, Test two times daily, Disp: 100 each, Rfl: 12   Lancets (ONETOUCH DELICA PLUS LANCET33G) MISC, , Disp: , Rfl:    levothyroxine (SYNTHROID) 75 MCG tablet, TAKE 1 TABLET BY MOUTH DAILY  BEFORE BREAKFAST, Disp: 100 tablet, Rfl: 2   lisinopril (ZESTRIL) 10 MG tablet, TAKE 1 TABLET BY MOUTH DAILY (Patient taking differently: Take 10 mg by mouth daily.), Disp: 100 tablet, Rfl: 2   methylPREDNISolone (MEDROL DOSEPAK) 4 MG TBPK tablet, As directed, Disp: 21 tablet, Rfl: 0   ondansetron (ZOFRAN) 4 MG tablet, Take 4 mg by mouth every 8 (eight) hours as needed for nausea or vomiting., Disp: , Rfl:    ONETOUCH VERIO test strip, 1 each 3 (three) times daily., Disp: , Rfl:    oxyCODONE (ROXICODONE) 15 MG immediate release tablet, Take 1 tablet (15 mg total) by mouth every 6 (six) hours as needed for pain., Disp: 120 tablet, Rfl: 0   oxyCODONE-acetaminophen (PERCOCET) 5-325 MG tablet, Take 1 tablet by mouth every 4 (four) hours as needed for severe pain., Disp: 30 tablet, Rfl: 0   rOPINIRole (REQUIP) 0.5 MG tablet, TAKE 1 TABLET BY MOUTH AT  BEDTIME, Disp: 100 tablet, Rfl: 2   sodium hypochlorite (DAKIN'S 1/2 STRENGTH) external solution, Irrigate with 1 Application as directed daily., Disp: 437 mL, Rfl: 0   vitamin B-12 (CYANOCOBALAMIN) 1000 MCG tablet, Take 1,000 mcg by mouth daily., Disp: , Rfl:   Social History   Tobacco Use  Smoking Status Every Day   Packs/day: 1.00   Years: 10.00   Additional pack years: 0.00   Total pack years: 10.00   Types: Cigarettes  Smokeless Tobacco Never  Tobacco Comments   Trying to quit, smoking 1 ppd    Allergies  Allergen Reactions   Cephalexin Nausea And Vomiting   Demerol  [Meperidine Hcl] Rash   Lovastatin Other (See Comments)    Possible myalgia    Metformin And Related  Nausea And Vomiting   Sulfamethoxazole-Trimethoprim     Other reaction(s): Unknown DILI, pancreatitis   Crestor [Rosuvastatin Calcium]     myalgia   Hydromorphone     Other reaction(s): Unknown   Niacin Other (See Comments)    Unknown    Other Other (See Comments)   Hydromorphone Hcl Itching    Patient has been tolerating  Hydromorphone tablets without adverse effect (07/09/19) Other reaction(s): Unknown   Paroxetine Other (See Comments)    Unknown    Objective:  There were no vitals filed for this visit. There is no height or weight on file to calculate BMI. Constitutional Well developed. Well nourished.  Vascular Foot warm and well perfused. Capillary refill normal to all digits.   Neurologic Normal speech. Oriented to person, place, and time. Epicritic sensation to light touch grossly present bilaterally.  Dermatologic Cast in place.  Motor or sensory functions are intact no calf pain negative Homans' sign  Orthopedic: Tenderness to palpation noted about the surgical site.   Radiographs: None Assessment:   1. Post-operative state   2. Tendon weakness   3. Equinus contracture of right ankle   4. Cavovarus deformity of foot, acquired, right    Plan:  Patient was evaluated and treated and all questions answered.  S/p foot surgery right -Progressing as expected post-operatively. -XR: See above -WB Status: Nonweightbearing in right lower extremity in a cast -Sutures: Intact.  No clinical signs of Deis is noted no complication noted -Medications: None -Foot redressed.  No follow-ups on file.

## 2023-04-04 ENCOUNTER — Other Ambulatory Visit: Payer: Self-pay | Admitting: Internal Medicine

## 2023-04-09 DIAGNOSIS — E1165 Type 2 diabetes mellitus with hyperglycemia: Secondary | ICD-10-CM | POA: Diagnosis not present

## 2023-04-12 ENCOUNTER — Ambulatory Visit (INDEPENDENT_AMBULATORY_CARE_PROVIDER_SITE_OTHER): Payer: 59 | Admitting: Podiatry

## 2023-04-12 DIAGNOSIS — Z9889 Other specified postprocedural states: Secondary | ICD-10-CM

## 2023-04-12 MED ORDER — DOXYCYCLINE HYCLATE 100 MG PO TABS: 100.0000 mg | ORAL_TABLET | Freq: Two times a day (BID) | ORAL | 0 refills | Status: DC

## 2023-04-12 NOTE — Progress Notes (Signed)
Subjective:  Patient ID: Katherine Herrera, female    DOB: 05/18/1966,  MRN: [id number]  Chief Complaint  Patient presents with   Routine Post Op    DOS: 02/27/2023 Procedure: Right posterior tibial tendon lengthening Achilles tendon lengthening and tenodesis of the peroneal tendon  57 y.o. female returns for post-op check.  Patient states she is doing okay.  No nausea fever chills vomiting.  She states outside part of the foot hurts a little bit.  Review of Systems: Negative except as noted in the HPI. Denies N/V/F/Ch.  Past Medical History:  Diagnosis Date   Anxiety    Arthritis    hands   B12 deficiency    Chronic constipation    Chronic pain syndrome    neck, lower back, and foot   COPD (chronic obstructive pulmonary disease) (HCC)    Diabetic ulcer of right foot (HCC)    Gout    possible   History of seizure    05-26-2020  per pt as child and last one in late teen's   History of sepsis    due to lower extremity cellulits 2019 left , right 03/ 2021   History of syncope    Hyperlipidemia    Hypothyroidism    Impaired range of motion of cervical spine    per pt hx cervical fusion C4 -- 7,  has to be careful about turning her head to the right can cause her to pass out   Insulin dependent type 2 diabetes mellitus [facility])    endocrinologist--- dr Talmage Nap    (05-26-2020 per pt check's blood sugar twice dialy and at times a third time,  fasting am blood sugar's---- 200 -- 250)   LBP (low back pain)    DR Noel Gerold   Migraine    Peripheral neuropathy    RBBB (right bundle branch block)    Tenosynovitis of foot and ankle 09/03/2013   TTS (tarsal tunnel syndrome) 2010   Left Foot [facility] in [place]   Vitamin D deficiency    Wears glasses     Current Outpatient Medications:    doxycycline (VIBRA-TABS) 100 MG tablet, Take 1 tablet (100 mg total) by mouth 2 (two) times daily., Disp: 60 tablet, Rfl: 0   albuterol (VENTOLIN HFA) 108 (90 Base) MCG/ACT inhaler, Inhale 1 puff into the lungs  every 6 (six) hours as needed for shortness of breath., Disp: , Rfl:    aspirin 325 MG tablet, Take 325 mg by mouth daily., Disp: , Rfl:    B-D INS SYR ULTRAFINE 1CC/31G 31G X 5/16" 1 ML MISC, USE AS DIRECTED, Disp: 100 each, Rfl: 0   baclofen (LIORESAL) 10 MG tablet, TAKE 2 TABLETS BY MOUTH TWICE  DAILY, Disp: 180 tablet, Rfl: 1   bisacodyl (DULCOLAX) 5 MG EC tablet, Take 10 mg by mouth at bedtime., Disp: , Rfl:    Blood Glucose Monitoring Suppl (ONETOUCH VERIO) w/Device KIT, , Disp: , Rfl:    celecoxib (CELEBREX) 200 MG capsule, TAKE 1 CAPSULE TWICE DAILY FOR 2 WEEKS, THEN 1 CAPSULE TWICE DAILY AS NEEDED FOR PAIN WITH FOOD, Disp: 60 capsule, Rfl: 0   cholecalciferol (VITAMIN D3) 25 MCG (1000 UNIT) tablet, Take 1,000 Units by mouth daily., Disp: , Rfl:    Continuous Blood Gluc Sensor (FREESTYLE LIBRE 2 SENSOR) MISC, as directed subcutaneous every 14 days, Disp: , Rfl:    docusate sodium (COLACE) 100 MG capsule, Take 100 mg by mouth at bedtime., Disp: , Rfl:    ezetimibe (ZETIA)  10 MG tablet, TAKE 1 TABLET BY MOUTH DAILY (Patient taking differently: Take 10 mg by mouth daily.), Disp: 100 tablet, Rfl: 2   FLUoxetine (PROZAC) 10 MG capsule, TAKE 1 CAPSULE BY MOUTH EVERY DAY (Patient taking differently: Take 10 mg by mouth daily.), Disp: 90 capsule, Rfl: 2   gabapentin (NEURONTIN) 800 MG tablet, TAKE 1 TABLET BY MOUTH 4 TIMES  DAILY (Patient taking differently: Take 800 mg by mouth 3 (three) times daily.), Disp: 400 tablet, Rfl: 2   ibuprofen (ADVIL) 800 MG tablet, Take 1 tablet (800 mg total) by mouth every 6 (six) hours as needed., Disp: 60 tablet, Rfl: 1   insulin NPH-regular Human (70-30) 100 UNIT/ML injection, Inject 20 Units into the skin 2 (two) times daily with a meal. (Patient taking differently: Inject 30-50 Units into the skin See admin instructions. Inject 30 units SQ in the morning and afternoon then inject 50 units SQ at night), Disp: 10 mL, Rfl: 11   lamoTRIgine (LAMICTAL) 25 MG tablet,  TAKE 1 TABLET BY MOUTH TWICE  DAILY (Patient taking differently: Take 25 mg by mouth 2 (two) times daily.), Disp: 200 tablet, Rfl: 2   Lancet Devices (ACCU-CHEK SOFTCLIX) lancets, Test two times daily, Disp: 100 each, Rfl: 12   Lancets (ONETOUCH DELICA PLUS LANCET33G) MISC, , Disp: , Rfl:    levothyroxine (SYNTHROID) 75 MCG tablet, TAKE 1 TABLET BY MOUTH DAILY  BEFORE BREAKFAST, Disp: 100 tablet, Rfl: 2   lisinopril (ZESTRIL) 10 MG tablet, TAKE 1 TABLET BY MOUTH DAILY (Patient taking differently: Take 10 mg by mouth daily.), Disp: 100 tablet, Rfl: 2   methylPREDNISolone (MEDROL DOSEPAK) 4 MG TBPK tablet, As directed, Disp: 21 tablet, Rfl: 0   ondansetron (ZOFRAN) 4 MG tablet, Take 4 mg by mouth every 8 (eight) hours as needed for nausea or vomiting., Disp: , Rfl:    ONETOUCH VERIO test strip, 1 each 3 (three) times daily., Disp: , Rfl:    oxyCODONE (ROXICODONE) 15 MG immediate release tablet, Take 1 tablet (15 mg total) by mouth every 6 (six) hours as needed for pain., Disp: 120 tablet, Rfl: 0   oxyCODONE-acetaminophen (PERCOCET) 5-325 MG tablet, Take 1 tablet by mouth every 4 (four) hours as needed for severe pain., Disp: 30 tablet, Rfl: 0   rOPINIRole (REQUIP) 0.5 MG tablet, TAKE 1 TABLET BY MOUTH AT  BEDTIME, Disp: 100 tablet, Rfl: 2   sodium hypochlorite (DAKIN'S 1/2 STRENGTH) external solution, Irrigate with 1 Application as directed daily., Disp: 437 mL, Rfl: 0   vitamin B-12 (CYANOCOBALAMIN) 1000 MCG tablet, Take 1,000 mcg by mouth daily., Disp: , Rfl:   Social History   Tobacco Use  Smoking Status Every Day   Packs/day: 1.00   Years: 10.00   Additional pack years: 0.00   Total pack years: 10.00   Types: Cigarettes  Smokeless Tobacco Never  Tobacco Comments   Trying to quit, smoking 1 ppd    Allergies  Allergen Reactions   Cephalexin Nausea And Vomiting   Demerol  [Meperidine Hcl] Rash   Lovastatin Other (See Comments)    Possible myalgia    Metformin And Related Nausea And  Vomiting   Sulfamethoxazole-Trimethoprim     Other reaction(s): Unknown DILI, pancreatitis   Crestor [Rosuvastatin Calcium]     myalgia   Hydromorphone     Other reaction(s): Unknown   Niacin Other (See Comments)    Unknown    Other Other (See Comments)   Hydromorphone Hcl Itching    Patient has been  tolerating Hydromorphone tablets without adverse effect (07/09/19) Other reaction(s): Unknown   Paroxetine Other (See Comments)    Unknown    Objective:  There were no vitals filed for this visit. There is no height or weight on file to calculate BMI. Constitutional Well developed. Well nourished.  Vascular Foot warm and well perfused. Capillary refill normal to all digits.   Neurologic Normal speech. Oriented to person, place, and time. Epicritic sensation to light touch grossly present bilaterally.  Dermatologic Skin healing well without signs of infection. Skin edges well coapted without signs of infection.  Right lateral wound dehiscence no purulent drainage noted mild erythema noted.  Malodor present.  Orthopedic: Tenderness to palpation noted about the surgical site.   Radiographs: None Assessment:   1. Post-operative state    Plan:  Patient was evaluated and treated and all questions answered.  S/p foot surgery right -Progressing as expected post-operatively. -XR: None -WB Status: Nonweightbearing in right lower extremity -Sutures: Removed.  Deis is noted to the right lateral foot at the medial and the posterior incision healed completely without problems -Medications: Doxycycline for skin and soft tissue prophylaxis -I encouraged her to do Betadine wet-to-dry dressing every day and if the wound gets worse on the right lateral foot patient will see and go to the emergency room. -If there is no improvement we will discuss wound care center

## 2023-04-26 ENCOUNTER — Ambulatory Visit (INDEPENDENT_AMBULATORY_CARE_PROVIDER_SITE_OTHER): Payer: 59 | Admitting: Podiatry

## 2023-04-26 DIAGNOSIS — T8130XA Disruption of wound, unspecified, initial encounter: Secondary | ICD-10-CM

## 2023-04-26 DIAGNOSIS — E11621 Type 2 diabetes mellitus with foot ulcer: Secondary | ICD-10-CM

## 2023-04-26 DIAGNOSIS — L97412 Non-pressure chronic ulcer of right heel and midfoot with fat layer exposed: Secondary | ICD-10-CM

## 2023-04-26 NOTE — Progress Notes (Signed)
Subjective:  Patient ID: Katherine Herrera, female    DOB: 10-Jan-1966,  MRN: [id number]  Chief Complaint  Patient presents with   Routine Post Op    POV #3 DOS 02/27/23 --- RIGHT POSTERIOR TIBAL TENDON LENGTHENING WITH ACHILLES TENDON LENGTHENING WITH PERONEAL TENDON TRANSFER    DOS: 02/27/2023 Procedure: Right posterior tibial tendon lengthening Achilles tendon lengthening and tenodesis of the peroneal tendon  57 y.o. female returns for post-op check.  Patient states she is doing okay.  No nausea fever chills vomiting.  She states outside part of the foot hurts a little bit.  Review of Systems: Negative except as noted in the HPI. Denies N/V/F/Ch.  Past Medical History:  Diagnosis Date   Anxiety    Arthritis    hands   B12 deficiency    Chronic constipation    Chronic pain syndrome    neck, lower back, and foot   COPD (chronic obstructive pulmonary disease) (HCC)    Diabetic ulcer of right foot (HCC)    Gout    possible   History of seizure    05-26-2020  per pt as child and last one in late teen's   History of sepsis    due to lower extremity cellulits 2019 left , right 03/ 2021   History of syncope    Hyperlipidemia    Hypothyroidism    Impaired range of motion of cervical spine    per pt hx cervical fusion C4 -- 7,  has to be careful about turning her head to the right can cause her to pass out   Insulin dependent type 2 diabetes mellitus [facility])    endocrinologist--- dr Talmage Nap    (05-26-2020 per pt check's blood sugar twice dialy and at times a third time,  fasting am blood sugar's---- 200 -- 250)   LBP (low back pain)    DR Noel Gerold   Migraine    Peripheral neuropathy    RBBB (right bundle branch block)    Tenosynovitis of foot and ankle 09/03/2013   TTS (tarsal tunnel syndrome) 2010   Left Foot [facility] in [place]   Vitamin D deficiency    Wears glasses     Current Outpatient Medications:    albuterol (VENTOLIN HFA) 108 (90 Base) MCG/ACT inhaler, Inhale 1 puff into the  lungs every 6 (six) hours as needed for shortness of breath., Disp: , Rfl:    aspirin 325 MG tablet, Take 325 mg by mouth daily., Disp: , Rfl:    B-D INS SYR ULTRAFINE 1CC/31G 31G X 5/16" 1 ML MISC, USE AS DIRECTED, Disp: 100 each, Rfl: 0   baclofen (LIORESAL) 10 MG tablet, TAKE 2 TABLETS BY MOUTH TWICE  DAILY, Disp: 180 tablet, Rfl: 1   bisacodyl (DULCOLAX) 5 MG EC tablet, Take 10 mg by mouth at bedtime., Disp: , Rfl:    Blood Glucose Monitoring Suppl (ONETOUCH VERIO) w/Device KIT, , Disp: , Rfl:    celecoxib (CELEBREX) 200 MG capsule, Take 1 capsule (200 mg total) by mouth 2 (two) times daily as needed. TAKE 1 CAPSULE TWICE DAILY AS NEEDED FOR PAIN WITH FOOD, Disp: 60 capsule, Rfl: 0   cholecalciferol (VITAMIN D3) 25 MCG (1000 UNIT) tablet, Take 1,000 Units by mouth daily., Disp: , Rfl:    Continuous Blood Gluc Sensor (FREESTYLE LIBRE 2 SENSOR) MISC, as directed subcutaneous every 14 days, Disp: , Rfl:    docusate sodium (COLACE) 100 MG capsule, Take 100 mg by mouth at bedtime., Disp: , Rfl:  doxycycline (VIBRA-TABS) 100 MG tablet, Take 1 tablet (100 mg total) by mouth 2 (two) times daily., Disp: 60 tablet, Rfl: 0   ezetimibe (ZETIA) 10 MG tablet, TAKE 1 TABLET BY MOUTH DAILY (Patient taking differently: Take 10 mg by mouth daily.), Disp: 100 tablet, Rfl: 2   FLUoxetine (PROZAC) 10 MG capsule, TAKE 1 CAPSULE BY MOUTH EVERY DAY (Patient taking differently: Take 10 mg by mouth daily.), Disp: 90 capsule, Rfl: 2   gabapentin (NEURONTIN) 800 MG tablet, TAKE 1 TABLET BY MOUTH 4 TIMES  DAILY (Patient taking differently: Take 800 mg by mouth 3 (three) times daily.), Disp: 400 tablet, Rfl: 2   ibuprofen (ADVIL) 800 MG tablet, Take 1 tablet (800 mg total) by mouth every 6 (six) hours as needed., Disp: 60 tablet, Rfl: 1   insulin NPH-regular Human (70-30) 100 UNIT/ML injection, Inject 20 Units into the skin 2 (two) times daily with a meal. (Patient taking differently: Inject 30-50 Units into the skin See  admin instructions. Inject 30 units SQ in the morning and afternoon then inject 50 units SQ at night), Disp: 10 mL, Rfl: 11   lamoTRIgine (LAMICTAL) 25 MG tablet, TAKE 1 TABLET BY MOUTH TWICE  DAILY (Patient taking differently: Take 25 mg by mouth 2 (two) times daily.), Disp: 200 tablet, Rfl: 2   Lancet Devices (ACCU-CHEK SOFTCLIX) lancets, Test two times daily, Disp: 100 each, Rfl: 12   Lancets (ONETOUCH DELICA PLUS LANCET33G) MISC, , Disp: , Rfl:    levothyroxine (SYNTHROID) 75 MCG tablet, TAKE 1 TABLET BY MOUTH DAILY  BEFORE BREAKFAST, Disp: 100 tablet, Rfl: 2   lisinopril (ZESTRIL) 10 MG tablet, TAKE 1 TABLET BY MOUTH DAILY (Patient taking differently: Take 10 mg by mouth daily.), Disp: 100 tablet, Rfl: 2   methylPREDNISolone (MEDROL DOSEPAK) 4 MG TBPK tablet, As directed, Disp: 21 tablet, Rfl: 0   ondansetron (ZOFRAN) 4 MG tablet, Take 4 mg by mouth every 8 (eight) hours as needed for nausea or vomiting., Disp: , Rfl:    ONETOUCH VERIO test strip, 1 each 3 (three) times daily., Disp: , Rfl:    oxyCODONE (ROXICODONE) 15 MG immediate release tablet, Take 1 tablet (15 mg total) by mouth every 6 (six) hours as needed for pain., Disp: 120 tablet, Rfl: 0   oxyCODONE-acetaminophen (PERCOCET) 5-325 MG tablet, Take 1 tablet by mouth every 4 (four) hours as needed for severe pain., Disp: 30 tablet, Rfl: 0   rOPINIRole (REQUIP) 0.5 MG tablet, TAKE 1 TABLET BY MOUTH AT  BEDTIME, Disp: 100 tablet, Rfl: 2   sodium hypochlorite (DAKIN'S 1/2 STRENGTH) external solution, Irrigate with 1 Application as directed daily., Disp: 437 mL, Rfl: 0   vitamin B-12 (CYANOCOBALAMIN) 1000 MCG tablet, Take 1,000 mcg by mouth daily., Disp: , Rfl:   Social History   Tobacco Use  Smoking Status Every Day   Packs/day: 1.00   Years: 10.00   Additional pack years: 0.00   Total pack years: 10.00   Types: Cigarettes  Smokeless Tobacco Never  Tobacco Comments   Trying to quit, smoking 1 ppd    Allergies  Allergen  Reactions   Cephalexin Nausea And Vomiting   Demerol  [Meperidine Hcl] Rash   Lovastatin Other (See Comments)    Possible myalgia    Metformin And Related Nausea And Vomiting   Sulfamethoxazole-Trimethoprim     Other reaction(s): Unknown DILI, pancreatitis   Crestor [Rosuvastatin Calcium]     myalgia   Hydromorphone     Other reaction(s): Unknown  Niacin Other (See Comments)    Unknown    Other Other (See Comments)   Hydromorphone Hcl Itching    Patient has been tolerating Hydromorphone tablets without adverse effect (07/09/19) Other reaction(s): Unknown   Paroxetine Other (See Comments)    Unknown    Objective:  There were no vitals filed for this visit. There is no height or weight on file to calculate BMI. Constitutional Well developed. Well nourished.  Vascular Foot warm and well perfused. Capillary refill normal to all digits.   Neurologic Normal speech. Oriented to person, place, and time. Epicritic sensation to light touch grossly present bilaterally.  Dermatologic Skin healing well without signs of infection. Skin edges well coapted without signs of infection.  Right lateral wound dehiscence no purulent drainage no further erythema noted no further malodor present  Orthopedic: Mild tenderness to palpation noted about the surgical site.   Radiographs: None Assessment:   1. Diabetic ulcer of right midfoot associated with type 2 diabetes mellitus, with fat layer exposed (HCC)    Plan:  Patient was evaluated and treated and all questions answered.  S/p foot surgery right -Progressing as expected post-operatively. -XR: None -WB Status: Nonweightbearing in right lower extremity -Sutures: Removed.  Deis is noted to the right lateral foot at the medial and the posterior incision healed completely without problems -Medications: Doxycycline for skin and soft tissue prophylaxis -I encouraged her to do Betadine wet-to-dry dressing every day and if the wound gets worse on  the right lateral foot patient will see and go to the emergency room. -If there is no improvement we will discuss wound care center -Continue doxycyclineAntibiotics - home health set up for Betadine wet-to-dry dressing 3 times a week

## 2023-04-27 LAB — HM DIABETES EYE EXAM

## 2023-04-28 ENCOUNTER — Telehealth: Payer: Self-pay | Admitting: Internal Medicine

## 2023-04-28 MED ORDER — CELECOXIB 200 MG PO CAPS: 200.0000 mg | ORAL_CAPSULE | Freq: Two times a day (BID) | ORAL | 0 refills | Status: DC | PRN

## 2023-04-28 NOTE — Telephone Encounter (Signed)
[facility] to [facility] 

## 2023-04-28 NOTE — Telephone Encounter (Signed)
Prescription Request  04/28/2023  LOV: 03/08/2023  What is the name of the medication or equipment? celecoxib (CELEBREX) 200 MG capsule   Have you contacted your pharmacy to request a refill? No   Which pharmacy would you like this sent to?  [id number] [place] [place] - [address] [place] [address] Phone: [phone] Fax: [phone]    Patient notified that their request is being sent to the clinical staff for review and that they should receive a response within 2 business days.   Please advise at Mobile [phone] (mobile)

## 2023-05-09 ENCOUNTER — Telehealth: Payer: Self-pay | Admitting: *Deleted

## 2023-05-09 DIAGNOSIS — E1165 Type 2 diabetes mellitus with hyperglycemia: Secondary | ICD-10-CM | POA: Diagnosis not present

## 2023-05-09 NOTE — Telephone Encounter (Signed)
-----   Message from Candelaria Stagers, DPM sent at 05/02/2023  9:02 AM EDT ----- Regarding: Home health set up Hi Dianely Krehbiel,   Can you set this up with home health I have placed the order

## 2023-05-09 NOTE — Telephone Encounter (Signed)
Order has been sent to [facility], per patient's request.She has been updated

## 2023-05-10 ENCOUNTER — Other Ambulatory Visit: Payer: Self-pay | Admitting: Podiatry

## 2023-05-11 DIAGNOSIS — G894 Chronic pain syndrome: Secondary | ICD-10-CM | POA: Diagnosis not present

## 2023-05-11 DIAGNOSIS — E1142 Type 2 diabetes mellitus with diabetic polyneuropathy: Secondary | ICD-10-CM | POA: Diagnosis not present

## 2023-05-11 DIAGNOSIS — K5909 Other constipation: Secondary | ICD-10-CM | POA: Diagnosis not present

## 2023-05-11 DIAGNOSIS — M109 Gout, unspecified: Secondary | ICD-10-CM | POA: Diagnosis not present

## 2023-05-11 DIAGNOSIS — G43909 Migraine, unspecified, not intractable, without status migrainosus: Secondary | ICD-10-CM | POA: Diagnosis not present

## 2023-05-11 DIAGNOSIS — L97412 Non-pressure chronic ulcer of right heel and midfoot with fat layer exposed: Secondary | ICD-10-CM | POA: Diagnosis not present

## 2023-05-11 DIAGNOSIS — M545 Low back pain, unspecified: Secondary | ICD-10-CM | POA: Diagnosis not present

## 2023-05-11 DIAGNOSIS — E039 Hypothyroidism, unspecified: Secondary | ICD-10-CM | POA: Diagnosis not present

## 2023-05-11 DIAGNOSIS — J449 Chronic obstructive pulmonary disease, unspecified: Secondary | ICD-10-CM | POA: Diagnosis not present

## 2023-05-11 DIAGNOSIS — T8130XD Disruption of wound, unspecified, subsequent encounter: Secondary | ICD-10-CM | POA: Diagnosis not present

## 2023-05-11 DIAGNOSIS — Z794 Long term (current) use of insulin: Secondary | ICD-10-CM | POA: Diagnosis not present

## 2023-05-11 DIAGNOSIS — I10 Essential (primary) hypertension: Secondary | ICD-10-CM | POA: Diagnosis not present

## 2023-05-11 DIAGNOSIS — M542 Cervicalgia: Secondary | ICD-10-CM | POA: Diagnosis not present

## 2023-05-11 DIAGNOSIS — F17210 Nicotine dependence, cigarettes, uncomplicated: Secondary | ICD-10-CM | POA: Diagnosis not present

## 2023-05-15 ENCOUNTER — Other Ambulatory Visit: Payer: Self-pay | Admitting: Internal Medicine

## 2023-05-15 DIAGNOSIS — M545 Low back pain, unspecified: Secondary | ICD-10-CM | POA: Diagnosis not present

## 2023-05-15 DIAGNOSIS — G894 Chronic pain syndrome: Secondary | ICD-10-CM | POA: Diagnosis not present

## 2023-05-15 DIAGNOSIS — E039 Hypothyroidism, unspecified: Secondary | ICD-10-CM | POA: Diagnosis not present

## 2023-05-15 DIAGNOSIS — G43909 Migraine, unspecified, not intractable, without status migrainosus: Secondary | ICD-10-CM | POA: Diagnosis not present

## 2023-05-15 DIAGNOSIS — I10 Essential (primary) hypertension: Secondary | ICD-10-CM | POA: Diagnosis not present

## 2023-05-15 DIAGNOSIS — E1142 Type 2 diabetes mellitus with diabetic polyneuropathy: Secondary | ICD-10-CM | POA: Diagnosis not present

## 2023-05-15 DIAGNOSIS — J449 Chronic obstructive pulmonary disease, unspecified: Secondary | ICD-10-CM | POA: Diagnosis not present

## 2023-05-15 DIAGNOSIS — E1165 Type 2 diabetes mellitus with hyperglycemia: Secondary | ICD-10-CM | POA: Diagnosis not present

## 2023-05-15 DIAGNOSIS — L97412 Non-pressure chronic ulcer of right heel and midfoot with fat layer exposed: Secondary | ICD-10-CM | POA: Diagnosis not present

## 2023-05-15 DIAGNOSIS — K5909 Other constipation: Secondary | ICD-10-CM | POA: Diagnosis not present

## 2023-05-15 DIAGNOSIS — M542 Cervicalgia: Secondary | ICD-10-CM | POA: Diagnosis not present

## 2023-05-15 DIAGNOSIS — E7800 Pure hypercholesterolemia, unspecified: Secondary | ICD-10-CM | POA: Diagnosis not present

## 2023-05-15 DIAGNOSIS — F17210 Nicotine dependence, cigarettes, uncomplicated: Secondary | ICD-10-CM | POA: Diagnosis not present

## 2023-05-15 DIAGNOSIS — M109 Gout, unspecified: Secondary | ICD-10-CM | POA: Diagnosis not present

## 2023-05-15 DIAGNOSIS — Z794 Long term (current) use of insulin: Secondary | ICD-10-CM | POA: Diagnosis not present

## 2023-05-15 DIAGNOSIS — T8130XD Disruption of wound, unspecified, subsequent encounter: Secondary | ICD-10-CM | POA: Diagnosis not present

## 2023-05-17 ENCOUNTER — Encounter: Payer: Self-pay | Admitting: Pharmacist

## 2023-05-17 NOTE — Progress Notes (Addendum)
[facility]) [facility] Quality Pharmacy Team Statin Quality Measure Assessment  05/17/2023  Katherine Herrera 11-Dec-1965 [id number]  Per review of chart and payor information, she has a diagnosis of diabetes but is not currently filling a statin prescription.  This places her into the Statin Use In Patients with Diabetes (SUPD) measure for CMS.    Patient has documented trials of statins with reported muscle pains, but no corresponding CPT codes that would exclude patient from SUPD measure.  Please consider evaluating and coding her statin intolerance at tomorrow's office visit to remove her from the measure for 2024.  Code for past statin intolerance or  other exclusions (required annually)  Provider Requirements: Associate code during an office visit or telehealth encounter  Drug Induced Myopathy G72.0   Myopathy, unspecified G72.9   Myositis, unspecified M60.9   Rhabdomyolysis M62.82   Cirrhosis of liver K74.69   Prediabetes R73.03   PCOS E28.2   Thank you for allowing [facility] pharmacy to be a part of this patient's care.  Dellie Burns, PharmD Clinical Pharmacist [place]  Direct Dial: [phone]

## 2023-05-18 ENCOUNTER — Ambulatory Visit: Payer: 59 | Admitting: Internal Medicine

## 2023-05-19 DIAGNOSIS — L97412 Non-pressure chronic ulcer of right heel and midfoot with fat layer exposed: Secondary | ICD-10-CM | POA: Diagnosis not present

## 2023-05-19 DIAGNOSIS — M109 Gout, unspecified: Secondary | ICD-10-CM | POA: Diagnosis not present

## 2023-05-19 DIAGNOSIS — M545 Low back pain, unspecified: Secondary | ICD-10-CM | POA: Diagnosis not present

## 2023-05-19 DIAGNOSIS — E1142 Type 2 diabetes mellitus with diabetic polyneuropathy: Secondary | ICD-10-CM | POA: Diagnosis not present

## 2023-05-19 DIAGNOSIS — J449 Chronic obstructive pulmonary disease, unspecified: Secondary | ICD-10-CM | POA: Diagnosis not present

## 2023-05-19 DIAGNOSIS — G894 Chronic pain syndrome: Secondary | ICD-10-CM | POA: Diagnosis not present

## 2023-05-19 DIAGNOSIS — M542 Cervicalgia: Secondary | ICD-10-CM | POA: Diagnosis not present

## 2023-05-19 DIAGNOSIS — G43909 Migraine, unspecified, not intractable, without status migrainosus: Secondary | ICD-10-CM | POA: Diagnosis not present

## 2023-05-19 DIAGNOSIS — Z794 Long term (current) use of insulin: Secondary | ICD-10-CM | POA: Diagnosis not present

## 2023-05-19 DIAGNOSIS — T8130XD Disruption of wound, unspecified, subsequent encounter: Secondary | ICD-10-CM | POA: Diagnosis not present

## 2023-05-19 DIAGNOSIS — F17210 Nicotine dependence, cigarettes, uncomplicated: Secondary | ICD-10-CM | POA: Diagnosis not present

## 2023-05-19 DIAGNOSIS — E039 Hypothyroidism, unspecified: Secondary | ICD-10-CM | POA: Diagnosis not present

## 2023-05-19 DIAGNOSIS — K5909 Other constipation: Secondary | ICD-10-CM | POA: Diagnosis not present

## 2023-05-19 DIAGNOSIS — I10 Essential (primary) hypertension: Secondary | ICD-10-CM | POA: Diagnosis not present

## 2023-05-22 DIAGNOSIS — F17210 Nicotine dependence, cigarettes, uncomplicated: Secondary | ICD-10-CM | POA: Diagnosis not present

## 2023-05-22 DIAGNOSIS — M109 Gout, unspecified: Secondary | ICD-10-CM | POA: Diagnosis not present

## 2023-05-22 DIAGNOSIS — J449 Chronic obstructive pulmonary disease, unspecified: Secondary | ICD-10-CM | POA: Diagnosis not present

## 2023-05-22 DIAGNOSIS — M542 Cervicalgia: Secondary | ICD-10-CM | POA: Diagnosis not present

## 2023-05-22 DIAGNOSIS — N189 Chronic kidney disease, unspecified: Secondary | ICD-10-CM | POA: Diagnosis not present

## 2023-05-22 DIAGNOSIS — M545 Low back pain, unspecified: Secondary | ICD-10-CM | POA: Diagnosis not present

## 2023-05-22 DIAGNOSIS — E039 Hypothyroidism, unspecified: Secondary | ICD-10-CM | POA: Diagnosis not present

## 2023-05-22 DIAGNOSIS — E1165 Type 2 diabetes mellitus with hyperglycemia: Secondary | ICD-10-CM | POA: Diagnosis not present

## 2023-05-22 DIAGNOSIS — K5909 Other constipation: Secondary | ICD-10-CM | POA: Diagnosis not present

## 2023-05-22 DIAGNOSIS — G894 Chronic pain syndrome: Secondary | ICD-10-CM | POA: Diagnosis not present

## 2023-05-22 DIAGNOSIS — L97412 Non-pressure chronic ulcer of right heel and midfoot with fat layer exposed: Secondary | ICD-10-CM | POA: Diagnosis not present

## 2023-05-22 DIAGNOSIS — E1142 Type 2 diabetes mellitus with diabetic polyneuropathy: Secondary | ICD-10-CM | POA: Diagnosis not present

## 2023-05-22 DIAGNOSIS — G43909 Migraine, unspecified, not intractable, without status migrainosus: Secondary | ICD-10-CM | POA: Diagnosis not present

## 2023-05-22 DIAGNOSIS — E1140 Type 2 diabetes mellitus with diabetic neuropathy, unspecified: Secondary | ICD-10-CM | POA: Diagnosis not present

## 2023-05-22 DIAGNOSIS — T8130XD Disruption of wound, unspecified, subsequent encounter: Secondary | ICD-10-CM | POA: Diagnosis not present

## 2023-05-22 DIAGNOSIS — E7800 Pure hypercholesterolemia, unspecified: Secondary | ICD-10-CM | POA: Diagnosis not present

## 2023-05-22 DIAGNOSIS — I10 Essential (primary) hypertension: Secondary | ICD-10-CM | POA: Diagnosis not present

## 2023-05-22 DIAGNOSIS — Z794 Long term (current) use of insulin: Secondary | ICD-10-CM | POA: Diagnosis not present

## 2023-05-26 ENCOUNTER — Ambulatory Visit: Payer: 59 | Admitting: Podiatry

## 2023-05-26 DIAGNOSIS — K5909 Other constipation: Secondary | ICD-10-CM | POA: Diagnosis not present

## 2023-05-26 DIAGNOSIS — M545 Low back pain, unspecified: Secondary | ICD-10-CM | POA: Diagnosis not present

## 2023-05-26 DIAGNOSIS — F17210 Nicotine dependence, cigarettes, uncomplicated: Secondary | ICD-10-CM | POA: Diagnosis not present

## 2023-05-26 DIAGNOSIS — M109 Gout, unspecified: Secondary | ICD-10-CM | POA: Diagnosis not present

## 2023-05-26 DIAGNOSIS — E1142 Type 2 diabetes mellitus with diabetic polyneuropathy: Secondary | ICD-10-CM | POA: Diagnosis not present

## 2023-05-26 DIAGNOSIS — I10 Essential (primary) hypertension: Secondary | ICD-10-CM | POA: Diagnosis not present

## 2023-05-26 DIAGNOSIS — M542 Cervicalgia: Secondary | ICD-10-CM | POA: Diagnosis not present

## 2023-05-26 DIAGNOSIS — L97412 Non-pressure chronic ulcer of right heel and midfoot with fat layer exposed: Secondary | ICD-10-CM | POA: Diagnosis not present

## 2023-05-26 DIAGNOSIS — G894 Chronic pain syndrome: Secondary | ICD-10-CM | POA: Diagnosis not present

## 2023-05-26 DIAGNOSIS — E039 Hypothyroidism, unspecified: Secondary | ICD-10-CM | POA: Diagnosis not present

## 2023-05-26 DIAGNOSIS — G43909 Migraine, unspecified, not intractable, without status migrainosus: Secondary | ICD-10-CM | POA: Diagnosis not present

## 2023-05-26 DIAGNOSIS — J449 Chronic obstructive pulmonary disease, unspecified: Secondary | ICD-10-CM | POA: Diagnosis not present

## 2023-05-26 DIAGNOSIS — Z794 Long term (current) use of insulin: Secondary | ICD-10-CM | POA: Diagnosis not present

## 2023-05-26 DIAGNOSIS — T8130XD Disruption of wound, unspecified, subsequent encounter: Secondary | ICD-10-CM | POA: Diagnosis not present

## 2023-05-29 DIAGNOSIS — M542 Cervicalgia: Secondary | ICD-10-CM | POA: Diagnosis not present

## 2023-05-29 DIAGNOSIS — E1142 Type 2 diabetes mellitus with diabetic polyneuropathy: Secondary | ICD-10-CM | POA: Diagnosis not present

## 2023-05-29 DIAGNOSIS — Z794 Long term (current) use of insulin: Secondary | ICD-10-CM | POA: Diagnosis not present

## 2023-05-29 DIAGNOSIS — I10 Essential (primary) hypertension: Secondary | ICD-10-CM | POA: Diagnosis not present

## 2023-05-29 DIAGNOSIS — L97412 Non-pressure chronic ulcer of right heel and midfoot with fat layer exposed: Secondary | ICD-10-CM | POA: Diagnosis not present

## 2023-05-29 DIAGNOSIS — M109 Gout, unspecified: Secondary | ICD-10-CM | POA: Diagnosis not present

## 2023-05-29 DIAGNOSIS — T8130XD Disruption of wound, unspecified, subsequent encounter: Secondary | ICD-10-CM | POA: Diagnosis not present

## 2023-05-29 DIAGNOSIS — M545 Low back pain, unspecified: Secondary | ICD-10-CM | POA: Diagnosis not present

## 2023-05-29 DIAGNOSIS — F17210 Nicotine dependence, cigarettes, uncomplicated: Secondary | ICD-10-CM | POA: Diagnosis not present

## 2023-05-29 DIAGNOSIS — G43909 Migraine, unspecified, not intractable, without status migrainosus: Secondary | ICD-10-CM | POA: Diagnosis not present

## 2023-05-29 DIAGNOSIS — E039 Hypothyroidism, unspecified: Secondary | ICD-10-CM | POA: Diagnosis not present

## 2023-05-29 DIAGNOSIS — K5909 Other constipation: Secondary | ICD-10-CM | POA: Diagnosis not present

## 2023-05-29 DIAGNOSIS — G894 Chronic pain syndrome: Secondary | ICD-10-CM | POA: Diagnosis not present

## 2023-05-29 DIAGNOSIS — J449 Chronic obstructive pulmonary disease, unspecified: Secondary | ICD-10-CM | POA: Diagnosis not present

## 2023-05-31 ENCOUNTER — Ambulatory Visit (INDEPENDENT_AMBULATORY_CARE_PROVIDER_SITE_OTHER): Payer: 59 | Admitting: Podiatry

## 2023-05-31 DIAGNOSIS — L97412 Non-pressure chronic ulcer of right heel and midfoot with fat layer exposed: Secondary | ICD-10-CM

## 2023-05-31 DIAGNOSIS — E11621 Type 2 diabetes mellitus with foot ulcer: Secondary | ICD-10-CM

## 2023-05-31 MED ORDER — SANTYL 250 UNIT/GM EX OINT: 1.0000 | TOPICAL_OINTMENT | Freq: Every day | CUTANEOUS | 0 refills | Status: AC

## 2023-05-31 NOTE — Progress Notes (Signed)
Subjective:  Patient ID: Katherine Herrera, female    DOB: Nov 18, 1965,  MRN: [id number]  Chief Complaint  Patient presents with   Wound Check    DOS: 02/27/2023 Procedure: Right posterior tibial tendon lengthening Achilles tendon lengthening and tenodesis of the peroneal tendon  57 y.o. female returns for post-op check.  Patient states she is doing okay.  No nausea fever chills vomiting.  She states outside part of the foot hurts a little bit.  Review of Systems: Negative except as noted in the HPI. Denies N/V/F/Ch.  Past Medical History:  Diagnosis Date   Anxiety    Arthritis    hands   B12 deficiency    Chronic constipation    Chronic pain syndrome    neck, lower back, and foot   COPD (chronic obstructive pulmonary disease) (HCC)    Diabetic ulcer of right foot (HCC)    Gout    possible   History of seizure    05-26-2020  per pt as child and last one in late teen's   History of sepsis    due to lower extremity cellulits 2019 left , right 03/ 2021   History of syncope    Hyperlipidemia    Hypothyroidism    Impaired range of motion of cervical spine    per pt hx cervical fusion C4 -- 7,  has to be careful about turning her head to the right can cause her to pass out   Insulin dependent type 2 diabetes mellitus [facility])    endocrinologist--- dr Talmage Nap    (05-26-2020 per pt check's blood sugar twice dialy and at times a third time,  fasting am blood sugar's---- 200 -- 250)   LBP (low back pain)    DR Noel Gerold   Migraine    Peripheral neuropathy    RBBB (right bundle branch block)    Tenosynovitis of foot and ankle 09/03/2013   TTS (tarsal tunnel syndrome) 2010   Left Foot [facility] in [place]   Vitamin D deficiency    Wears glasses     Current Outpatient Medications:    collagenase (SANTYL) 250 UNIT/GM ointment, Apply 1 Application topically daily., Disp: 15 g, Rfl: 0   albuterol (VENTOLIN HFA) 108 (90 Base) MCG/ACT inhaler, Inhale 1 puff into the lungs every 6 (six) hours as needed  for shortness of breath., Disp: , Rfl:    aspirin 325 MG tablet, Take 325 mg by mouth daily., Disp: , Rfl:    B-D INS SYR ULTRAFINE 1CC/31G 31G X 5/16" 1 ML MISC, USE AS DIRECTED, Disp: 100 each, Rfl: 0   baclofen (LIORESAL) 10 MG tablet, TAKE 2 TABLETS BY MOUTH TWICE  DAILY, Disp: 180 tablet, Rfl: 1   bisacodyl (DULCOLAX) 5 MG EC tablet, Take 10 mg by mouth at bedtime., Disp: , Rfl:    Blood Glucose Monitoring Suppl (ONETOUCH VERIO) w/Device KIT, , Disp: , Rfl:    celecoxib (CELEBREX) 200 MG capsule, Take 1 capsule (200 mg total) by mouth 2 (two) times daily as needed. TAKE 1 CAPSULE TWICE DAILY AS NEEDED FOR PAIN WITH FOOD, Disp: 60 capsule, Rfl: 0   cholecalciferol (VITAMIN D3) 25 MCG (1000 UNIT) tablet, Take 1,000 Units by mouth daily., Disp: , Rfl:    Continuous Blood Gluc Sensor (FREESTYLE LIBRE 2 SENSOR) MISC, as directed subcutaneous every 14 days, Disp: , Rfl:    docusate sodium (COLACE) 100 MG capsule, Take 100 mg by mouth at bedtime., Disp: , Rfl:    doxycycline (VIBRA-TABS) 100 MG  tablet, Take 1 tablet (100 mg total) by mouth 2 (two) times daily., Disp: 60 tablet, Rfl: 0   ezetimibe (ZETIA) 10 MG tablet, TAKE 1 TABLET BY MOUTH DAILY (Patient taking differently: Take 10 mg by mouth daily.), Disp: 100 tablet, Rfl: 2   FLUoxetine (PROZAC) 10 MG capsule, TAKE 1 CAPSULE BY MOUTH EVERY DAY (Patient taking differently: Take 10 mg by mouth daily.), Disp: 90 capsule, Rfl: 2   gabapentin (NEURONTIN) 800 MG tablet, TAKE 1 TABLET BY MOUTH 4 TIMES  DAILY (Patient taking differently: Take 800 mg by mouth 3 (three) times daily.), Disp: 400 tablet, Rfl: 2   ibuprofen (ADVIL) 800 MG tablet, Take 1 tablet (800 mg total) by mouth every 6 (six) hours as needed., Disp: 60 tablet, Rfl: 1   insulin NPH-regular Human (70-30) 100 UNIT/ML injection, Inject 20 Units into the skin 2 (two) times daily with a meal. (Patient taking differently: Inject 30-50 Units into the skin See admin instructions. Inject 30 units SQ  in the morning and afternoon then inject 50 units SQ at night), Disp: 10 mL, Rfl: 11   lamoTRIgine (LAMICTAL) 25 MG tablet, TAKE 1 TABLET BY MOUTH TWICE  DAILY, Disp: 200 tablet, Rfl: 2   Lancet Devices (ACCU-CHEK SOFTCLIX) lancets, Test two times daily, Disp: 100 each, Rfl: 12   Lancets (ONETOUCH DELICA PLUS LANCET33G) MISC, , Disp: , Rfl:    levothyroxine (SYNTHROID) 75 MCG tablet, TAKE 1 TABLET BY MOUTH DAILY  BEFORE BREAKFAST, Disp: 100 tablet, Rfl: 2   lisinopril (ZESTRIL) 10 MG tablet, TAKE 1 TABLET BY MOUTH DAILY (Patient taking differently: Take 10 mg by mouth daily.), Disp: 100 tablet, Rfl: 2   methylPREDNISolone (MEDROL DOSEPAK) 4 MG TBPK tablet, As directed, Disp: 21 tablet, Rfl: 0   ondansetron (ZOFRAN) 4 MG tablet, Take 4 mg by mouth every 8 (eight) hours as needed for nausea or vomiting., Disp: , Rfl:    ONETOUCH VERIO test strip, 1 each 3 (three) times daily., Disp: , Rfl:    oxyCODONE (ROXICODONE) 15 MG immediate release tablet, Take 1 tablet (15 mg total) by mouth every 6 (six) hours as needed for pain., Disp: 120 tablet, Rfl: 0   oxyCODONE-acetaminophen (PERCOCET) 5-325 MG tablet, Take 1 tablet by mouth every 4 (four) hours as needed for severe pain., Disp: 30 tablet, Rfl: 0   rOPINIRole (REQUIP) 0.5 MG tablet, TAKE 1 TABLET BY MOUTH AT  BEDTIME, Disp: 100 tablet, Rfl: 2   sodium hypochlorite (DAKIN'S 1/2 STRENGTH) external solution, Irrigate with 1 Application as directed daily., Disp: 437 mL, Rfl: 0   vitamin B-12 (CYANOCOBALAMIN) 1000 MCG tablet, Take 1,000 mcg by mouth daily., Disp: , Rfl:   Social History   Tobacco Use  Smoking Status Every Day   Current packs/day: 1.00   Average packs/day: 1 pack/day for 10.0 years (10.0 ttl pk-yrs)   Types: Cigarettes  Smokeless Tobacco Never  Tobacco Comments   Trying to quit, smoking 1 ppd    Allergies  Allergen Reactions   Cephalexin Nausea And Vomiting   Demerol  [Meperidine Hcl] Rash   Lovastatin Other (See Comments)     Possible myalgia    Metformin And Related Nausea And Vomiting   Sulfamethoxazole-Trimethoprim     Other reaction(s): Unknown DILI, pancreatitis   Crestor [Rosuvastatin Calcium]     myalgia   Hydromorphone     Other reaction(s): Unknown   Niacin Other (See Comments)    Unknown    Other Other (See Comments)   Hydromorphone Hcl  Itching    Patient has been tolerating Hydromorphone tablets without adverse effect (07/09/19) Other reaction(s): Unknown   Paroxetine Other (See Comments)    Unknown    Objective:  There were no vitals filed for this visit. There is no height or weight on file to calculate BMI. Constitutional Well developed. Well nourished.  Vascular Foot warm and well perfused. Capillary refill normal to all digits.   Neurologic Normal speech. Oriented to person, place, and time. Epicritic sensation to light touch grossly present bilaterally.  Dermatologic Skin healing well without signs of infection. Skin edges well coapted without signs of infection.  Right lateral wound dehiscence no purulent drainage no further erythema noted no further malodor present  Orthopedic: Mild tenderness to palpation noted about the surgical site.   Radiographs: None Assessment:   No diagnosis found.  Plan:  Patient was evaluated and treated and all questions answered.  S/p foot surgery right -Progressing as expected post-operatively. -XR: None -WB Status: Nonweightbearing in right lower extremity -Sutures: Removed.  Deis is noted to the right lateral foot at the medial and the posterior incision healed completely without problems -Medications: Doxycycline for skin and soft tissue prophylaxis -I encouraged her to do Betadine wet-to-dry dressing every day and if the wound gets worse on the right lateral foot patient will see and go to the emergency room. -If there is no improvement we will discuss wound care center -Continue doxycyclineAntibiotics - home health set up for santyl wet  to dry  -Wound measurement 1 cm x 0.5 cm

## 2023-06-01 ENCOUNTER — Ambulatory Visit: Payer: 59 | Admitting: Internal Medicine

## 2023-06-02 ENCOUNTER — Other Ambulatory Visit: Payer: Self-pay | Admitting: Internal Medicine

## 2023-06-02 DIAGNOSIS — F17210 Nicotine dependence, cigarettes, uncomplicated: Secondary | ICD-10-CM | POA: Diagnosis not present

## 2023-06-02 DIAGNOSIS — J449 Chronic obstructive pulmonary disease, unspecified: Secondary | ICD-10-CM | POA: Diagnosis not present

## 2023-06-02 DIAGNOSIS — Z794 Long term (current) use of insulin: Secondary | ICD-10-CM | POA: Diagnosis not present

## 2023-06-02 DIAGNOSIS — M545 Low back pain, unspecified: Secondary | ICD-10-CM | POA: Diagnosis not present

## 2023-06-02 DIAGNOSIS — M542 Cervicalgia: Secondary | ICD-10-CM | POA: Diagnosis not present

## 2023-06-02 DIAGNOSIS — G43909 Migraine, unspecified, not intractable, without status migrainosus: Secondary | ICD-10-CM | POA: Diagnosis not present

## 2023-06-02 DIAGNOSIS — L97412 Non-pressure chronic ulcer of right heel and midfoot with fat layer exposed: Secondary | ICD-10-CM | POA: Diagnosis not present

## 2023-06-02 DIAGNOSIS — G894 Chronic pain syndrome: Secondary | ICD-10-CM | POA: Diagnosis not present

## 2023-06-02 DIAGNOSIS — T8130XD Disruption of wound, unspecified, subsequent encounter: Secondary | ICD-10-CM | POA: Diagnosis not present

## 2023-06-02 DIAGNOSIS — M109 Gout, unspecified: Secondary | ICD-10-CM | POA: Diagnosis not present

## 2023-06-02 DIAGNOSIS — E1142 Type 2 diabetes mellitus with diabetic polyneuropathy: Secondary | ICD-10-CM | POA: Diagnosis not present

## 2023-06-02 DIAGNOSIS — E039 Hypothyroidism, unspecified: Secondary | ICD-10-CM | POA: Diagnosis not present

## 2023-06-02 DIAGNOSIS — I10 Essential (primary) hypertension: Secondary | ICD-10-CM | POA: Diagnosis not present

## 2023-06-02 DIAGNOSIS — K5909 Other constipation: Secondary | ICD-10-CM | POA: Diagnosis not present

## 2023-06-05 DIAGNOSIS — L97412 Non-pressure chronic ulcer of right heel and midfoot with fat layer exposed: Secondary | ICD-10-CM | POA: Diagnosis not present

## 2023-06-05 DIAGNOSIS — M109 Gout, unspecified: Secondary | ICD-10-CM | POA: Diagnosis not present

## 2023-06-05 DIAGNOSIS — K5909 Other constipation: Secondary | ICD-10-CM | POA: Diagnosis not present

## 2023-06-05 DIAGNOSIS — G894 Chronic pain syndrome: Secondary | ICD-10-CM | POA: Diagnosis not present

## 2023-06-05 DIAGNOSIS — I10 Essential (primary) hypertension: Secondary | ICD-10-CM | POA: Diagnosis not present

## 2023-06-05 DIAGNOSIS — E039 Hypothyroidism, unspecified: Secondary | ICD-10-CM | POA: Diagnosis not present

## 2023-06-05 DIAGNOSIS — G43909 Migraine, unspecified, not intractable, without status migrainosus: Secondary | ICD-10-CM | POA: Diagnosis not present

## 2023-06-05 DIAGNOSIS — T8130XD Disruption of wound, unspecified, subsequent encounter: Secondary | ICD-10-CM | POA: Diagnosis not present

## 2023-06-05 DIAGNOSIS — J449 Chronic obstructive pulmonary disease, unspecified: Secondary | ICD-10-CM | POA: Diagnosis not present

## 2023-06-05 DIAGNOSIS — F17210 Nicotine dependence, cigarettes, uncomplicated: Secondary | ICD-10-CM | POA: Diagnosis not present

## 2023-06-05 DIAGNOSIS — E1142 Type 2 diabetes mellitus with diabetic polyneuropathy: Secondary | ICD-10-CM | POA: Diagnosis not present

## 2023-06-05 DIAGNOSIS — M545 Low back pain, unspecified: Secondary | ICD-10-CM | POA: Diagnosis not present

## 2023-06-05 DIAGNOSIS — M542 Cervicalgia: Secondary | ICD-10-CM | POA: Diagnosis not present

## 2023-06-05 DIAGNOSIS — Z794 Long term (current) use of insulin: Secondary | ICD-10-CM | POA: Diagnosis not present

## 2023-06-07 ENCOUNTER — Encounter: Payer: Self-pay | Admitting: Internal Medicine

## 2023-06-07 ENCOUNTER — Ambulatory Visit (INDEPENDENT_AMBULATORY_CARE_PROVIDER_SITE_OTHER): Payer: 59 | Admitting: Internal Medicine

## 2023-06-07 ENCOUNTER — Telehealth: Payer: Self-pay | Admitting: Internal Medicine

## 2023-06-07 ENCOUNTER — Ambulatory Visit (HOSPITAL_BASED_OUTPATIENT_CLINIC_OR_DEPARTMENT_OTHER): Payer: 59 | Admitting: Physician Assistant

## 2023-06-07 VITALS — BP 110/60 | HR 93 | Temp 97.6°F | Ht 69.0 in | Wt 200.0 lb

## 2023-06-07 DIAGNOSIS — E538 Deficiency of other specified B group vitamins: Secondary | ICD-10-CM

## 2023-06-07 DIAGNOSIS — M5440 Lumbago with sciatica, unspecified side: Secondary | ICD-10-CM

## 2023-06-07 DIAGNOSIS — E039 Hypothyroidism, unspecified: Secondary | ICD-10-CM | POA: Diagnosis not present

## 2023-06-07 DIAGNOSIS — E0842 Diabetes mellitus due to underlying condition with diabetic polyneuropathy: Secondary | ICD-10-CM

## 2023-06-07 DIAGNOSIS — G8929 Other chronic pain: Secondary | ICD-10-CM

## 2023-06-07 DIAGNOSIS — E1142 Type 2 diabetes mellitus with diabetic polyneuropathy: Secondary | ICD-10-CM | POA: Diagnosis not present

## 2023-06-07 MED ORDER — OXYCODONE HCL 15 MG PO TABS: 15.0000 mg | ORAL_TABLET | Freq: Four times a day (QID) | ORAL | 0 refills | Status: DC | PRN

## 2023-06-07 NOTE — Assessment & Plan Note (Signed)
CBGs are better on Libre 2 A1c 8.4%

## 2023-06-07 NOTE — Telephone Encounter (Signed)
Sarah from [facility] called wanting approval for manufacturer change for levothyroxine (SYNTHROID) 75 MCG tablet

## 2023-06-07 NOTE — Assessment & Plan Note (Signed)
Chronic sx's/pain

## 2023-06-07 NOTE — Assessment & Plan Note (Signed)
On B12 

## 2023-06-07 NOTE — Assessment & Plan Note (Signed)
On Oxycodone 15 mg qid prn  Potential benefits of a long term opioids use as well as potential risks (i.e. addiction risk, apnea etc) and complications (i.e. Somnolence, constipation and others) were explained to the patient and were aknowledged. Worse Using a knee scooter R leg is in the boot

## 2023-06-07 NOTE — Telephone Encounter (Signed)
Called [facility] spoke w/Julie pharmacist will change manufacturer from [employer] to [employer]...Raechel Chute

## 2023-06-07 NOTE — Progress Notes (Signed)
Subjective:  Patient ID: Katherine Herrera, female    DOB: 03/23/1966  Age: 57 y.o. MRN: [id number]  CC: Follow-up (3 MNTH F/U)   HPI YEILY KOCUR presents for R foot pain, LBP - worse F/u DM, B12 def  Outpatient Medications Prior to Visit  Medication Sig Dispense Refill   albuterol (VENTOLIN HFA) 108 (90 Base) MCG/ACT inhaler Inhale 1 puff into the lungs every 6 (six) hours as needed for shortness of breath.     aspirin 325 MG tablet Take 325 mg by mouth daily.     B-D INS SYR ULTRAFINE 1CC/31G 31G X 5/16" 1 ML MISC USE AS DIRECTED 100 each 0   baclofen (LIORESAL) 10 MG tablet TAKE 2 TABLETS BY MOUTH TWICE  DAILY 180 tablet 1   bisacodyl (DULCOLAX) 5 MG EC tablet Take 10 mg by mouth at bedtime.     Blood Glucose Monitoring Suppl (ONETOUCH VERIO) w/Device KIT      celecoxib (CELEBREX) 200 MG capsule Take 1 capsule (200 mg total) by mouth 2 (two) times daily as needed. TAKE 1 CAPSULE TWICE DAILY AS NEEDED FOR PAIN WITH FOOD 60 capsule 0   cholecalciferol (VITAMIN D3) 25 MCG (1000 UNIT) tablet Take 1,000 Units by mouth daily.     collagenase (SANTYL) 250 UNIT/GM ointment Apply 1 Application topically daily. 15 g 0   Continuous Blood Gluc Sensor (FREESTYLE LIBRE 2 SENSOR) MISC as directed subcutaneous every 14 days     docusate sodium (COLACE) 100 MG capsule Take 100 mg by mouth at bedtime.     doxycycline (VIBRA-TABS) 100 MG tablet Take 1 tablet (100 mg total) by mouth 2 (two) times daily. 60 tablet 0   ezetimibe (ZETIA) 10 MG tablet TAKE 1 TABLET BY MOUTH DAILY (Patient taking differently: Take 10 mg by mouth daily.) 100 tablet 2   FLUoxetine (PROZAC) 10 MG capsule TAKE 1 CAPSULE BY MOUTH EVERY DAY (Patient taking differently: Take 10 mg by mouth daily.) 90 capsule 2   gabapentin (NEURONTIN) 800 MG tablet TAKE 1 TABLET BY MOUTH 4 TIMES  DAILY (Patient taking differently: Take 800 mg by mouth 3 (three) times daily.) 400 tablet 2   ibuprofen (ADVIL) 800 MG tablet Take 1 tablet (800 mg  total) by mouth every 6 (six) hours as needed. 60 tablet 1   insulin NPH-regular Human (70-30) 100 UNIT/ML injection Inject 20 Units into the skin 2 (two) times daily with a meal. (Patient taking differently: Inject 30-50 Units into the skin See admin instructions. Inject 30 units SQ in the morning and afternoon then inject 50 units SQ at night) 10 mL 11   lamoTRIgine (LAMICTAL) 25 MG tablet TAKE 1 TABLET BY MOUTH TWICE  DAILY 200 tablet 2   Lancet Devices (ACCU-CHEK SOFTCLIX) lancets Test two times daily 100 each 12   Lancets (ONETOUCH DELICA PLUS LANCET33G) MISC      levothyroxine (SYNTHROID) 75 MCG tablet TAKE 1 TABLET BY MOUTH DAILY  BEFORE BREAKFAST 100 tablet 2   lisinopril (ZESTRIL) 10 MG tablet TAKE 1 TABLET BY MOUTH DAILY (Patient taking differently: Take 10 mg by mouth daily.) 100 tablet 2   methylPREDNISolone (MEDROL DOSEPAK) 4 MG TBPK tablet As directed 21 tablet 0   ondansetron (ZOFRAN) 4 MG tablet Take 4 mg by mouth every 8 (eight) hours as needed for nausea or vomiting.     ONETOUCH VERIO test strip 1 each 3 (three) times daily.     oxyCODONE (ROXICODONE) 15 MG immediate release tablet Take 1  tablet (15 mg total) by mouth every 6 (six) hours as needed for pain. 120 tablet 0   oxyCODONE-acetaminophen (PERCOCET) 5-325 MG tablet Take 1 tablet by mouth every 4 (four) hours as needed for severe pain. 30 tablet 0   rOPINIRole (REQUIP) 0.5 MG tablet TAKE 1 TABLET BY MOUTH AT  BEDTIME 100 tablet 2   sodium hypochlorite (DAKIN'S 1/2 STRENGTH) external solution Irrigate with 1 Application as directed daily. 437 mL 0   vitamin B-12 (CYANOCOBALAMIN) 1000 MCG tablet Take 1,000 mcg by mouth daily.     No facility-administered medications prior to visit.    ROS: Review of Systems  Constitutional:  Positive for fatigue and unexpected weight change. Negative for activity change, appetite change and chills.  HENT:  Negative for congestion, mouth sores and sinus pressure.   Eyes:  Negative for  visual disturbance.  Respiratory:  Negative for cough and chest tightness.   Gastrointestinal:  Negative for abdominal pain and nausea.  Genitourinary:  Negative for difficulty urinating, frequency and vaginal pain.  Musculoskeletal:  Positive for arthralgias, back pain and gait problem.  Skin:  Negative for pallor and rash.  Neurological:  Negative for dizziness, tremors, weakness, numbness and headaches.  Psychiatric/Behavioral:  Negative for confusion, self-injury, sleep disturbance and suicidal ideas. The patient is not nervous/anxious.     Objective:  BP 110/60 (BP Location: Right Arm, Patient Position: Sitting, Cuff Size: Normal)   Pulse 93   Temp 97.6 F (36.4 C) (Oral)   Ht 5\' 9"  (1.753 m)   Wt 200 lb (90.7 kg)   SpO2 93%   BMI 29.53 kg/m   BP Readings from Last 3 Encounters:  06/07/23 110/60  03/08/23 128/72  02/15/23 (!) 140/60    Wt Readings from Last 3 Encounters:  06/07/23 200 lb (90.7 kg)  03/08/23 204 lb (92.5 kg)  02/15/23 209 lb (94.8 kg)    Physical Exam Constitutional:      General: She is not in acute distress.    Appearance: She is well-developed.  HENT:     Head: Normocephalic.     Right Ear: External ear normal.     Left Ear: External ear normal.     Nose: Nose normal.  Eyes:     General:        Right eye: No discharge.        Left eye: No discharge.     Conjunctiva/sclera: Conjunctivae normal.     Pupils: Pupils are equal, round, and reactive to light.  Neck:     Thyroid: No thyromegaly.     Vascular: No JVD.     Trachea: No tracheal deviation.  Cardiovascular:     Rate and Rhythm: Normal rate and regular rhythm.     Heart sounds: Normal heart sounds.  Pulmonary:     Effort: No respiratory distress.     Breath sounds: No stridor. No wheezing.  Abdominal:     General: Bowel sounds are normal. There is no distension.     Palpations: Abdomen is soft. There is no mass.     Tenderness: There is no abdominal tenderness. There is no  guarding or rebound.  Musculoskeletal:        General: No tenderness.     Cervical back: Normal range of motion and neck supple. No rigidity.  Lymphadenopathy:     Cervical: No cervical adenopathy.  Skin:    Findings: No erythema or rash.  Neurological:     Mental Status: She is oriented to person,  place, and time.     Cranial Nerves: No cranial nerve deficit.     Motor: No abnormal muscle tone.     Coordination: Coordination abnormal.     Gait: Gait abnormal.     Deep Tendon Reflexes: Reflexes normal.  Psychiatric:        Behavior: Behavior normal.        Thought Content: Thought content normal.        Judgment: Judgment normal.    Using a knee scooter R leg is in the boot LS spine w/pain  Lab Results  Component Value Date   WBC 8.1 01/25/2023   HGB 12.2 01/25/2023   HCT 37.4 01/25/2023   PLT 227 01/25/2023   GLUCOSE 256 (H) 01/25/2023   CHOL 215 (H) 02/23/2022   TRIG 301.0 (H) 02/23/2022   HDL 34.70 (L) 02/23/2022   LDLDIRECT 135.0 02/23/2022   LDLCALC 81 01/02/2013   ALT 16 01/22/2023   AST 17 01/22/2023   NA 132 (L) 01/25/2023   K 4.7 01/25/2023   CL 100 01/25/2023   CREATININE 1.13 (H) 01/25/2023   BUN 26 (H) 01/25/2023   CO2 26 01/25/2023   TSH 0.99 09/09/2021   INR 1.0 07/19/2019   HGBA1C 10.6 (H) 01/23/2023   MICROALBUR 0.9 07/19/2016    MR FOOT RIGHT W WO CONTRAST  Result Date: 01/23/2023 CLINICAL DATA:  Foot swelling, diabetic, osteomyelitis suspected, xray done lateral midfoot ulcer EXAM: MRI OF THE RIGHT FOREFOOT WITHOUT AND WITH CONTRAST TECHNIQUE: Multiplanar, multisequence MR imaging of the right forefoot was performed before and after the administration of intravenous contrast. CONTRAST:  8mL GADAVIST GADOBUTROL 1 MMOL/ML IV SOLN COMPARISON:  Multiple prior right foot radiographs, most recently 01/22/2023, MRI 03/11/2022 FINDINGS: Bones/Joint/Cartilage Postsurgical changes of prior fifth metatarsal base resection. There are sclerotic changes within  the residual base of fifth metatarsal with mild marrow edema and enhancement. There is also mild marrow edema and enhancement within the adjacent lateral aspect of the cuboid with chronic cortical erosion of the cuboid. Marrow signal abnormality is slightly worsened in comparison to prior MRI in May 2023. Ligaments Ankle ligaments are poorly evaluated due to scan obliquity. Muscles and Tendons Postsurgical changes of the peroneal brevis. Peroneal longus tendon appears intact. Soft tissues Lateral midfoot ulcer with adjacent skin thickening and underlying soft tissue swelling. There is no organized fluid collection. IMPRESSION: Lateral midfoot ulcer with postsurgical changes of prior base of fifth metatarsal resection. Chronic osseous changes in the residual base of fifth metatarsal and lateral cuboid, with underlying marrow edema and mild enhancement suggesting reactive marrow change or early acute on chronic osteomyelitis. No evidence of soft tissue abscess. Electronically Signed   By: Caprice Renshaw M.D.   On: 01/23/2023 15:48   DG Tibia/Fibula Right  Result Date: 01/22/2023 CLINICAL DATA:  Ulcer and infection EXAM: RIGHT TIBIA AND FIBULA - 2 VIEW COMPARISON:  None Available. FINDINGS: Diffuse bone demineralization. No evidence of acute fracture or dislocation. No focal bone lesion or bone erosion. Soft tissues are unremarkable. IMPRESSION: No acute bony abnormalities. Electronically Signed   By: Burman Nieves M.D.   On: 01/22/2023 22:47   DG Foot Complete Right  Result Date: 01/22/2023 CLINICAL DATA:  Ulcer and infection. EXAM: RIGHT FOOT COMPLETE - 3+ VIEW COMPARISON:  04/08/2022 FINDINGS: Diffuse bone demineralization. Previous resection or resorption of the distal right fifth metatarsal bone, unchanged. Bone erosion and sclerosis involving the base of the fifth metatarsal in the adjacent cuboidal bone likely representing osteomyelitis. Appearance is  similar to prior study. No new areas of bone  resorption or appreciated. Degenerative changes in the interphalangeal and intertarsal joints. No radiopaque soft tissue foreign bodies or soft tissue gas. IMPRESSION: Changes of chronic osteomyelitis suggested at the base of the fifth metatarsal and adjacent cuboidal bone similar to prior study. Soft tissue changes are improving since the prior study. Electronically Signed   By: Burman Nieves M.D.   On: 01/22/2023 22:46    Assessment & Plan:   Problem List Items Addressed This Visit     Polyneuropathy due to type 2 diabetes mellitus (HCC) (Chronic)    CBGs are better on Libre 2 A1c 8.4%      Relevant Orders   Comprehensive metabolic panel   CBC with Differential/Platelet   TSH   Hemoglobin A1c   Microalbumin / creatinine urine ratio   Urinalysis   Hypothyroidism    On Levothroid Dr Talmage Nap      Relevant Orders   Comprehensive metabolic panel   CBC with Differential/Platelet   TSH   Hemoglobin A1c   Microalbumin / creatinine urine ratio   Urinalysis   B12 deficiency    On B12      Diabetic neuropathy (HCC)    Chronic sx's/pain      Relevant Orders   Comprehensive metabolic panel   CBC with Differential/Platelet   TSH   Hemoglobin A1c   Microalbumin / creatinine urine ratio   Urinalysis   LOW BACK PAIN - Primary    On Oxycodone 15 mg qid prn  Potential benefits of a long term opioids use as well as potential risks (i.e. addiction risk, apnea etc) and complications (i.e. Somnolence, constipation and others) were explained to the patient and were aknowledged. Worse Using a knee scooter R leg is in the boot      Relevant Orders   Comprehensive metabolic panel   CBC with Differential/Platelet   TSH   Hemoglobin A1c   Microalbumin / creatinine urine ratio   Urinalysis      No orders of the defined types were placed in this encounter.     Follow-up: No follow-ups on file.  Sonda Primes, MD

## 2023-06-07 NOTE — Assessment & Plan Note (Signed)
On Levothroid Dr Chalmers Cater

## 2023-06-09 DIAGNOSIS — E1165 Type 2 diabetes mellitus with hyperglycemia: Secondary | ICD-10-CM | POA: Diagnosis not present

## 2023-06-13 DIAGNOSIS — L97412 Non-pressure chronic ulcer of right heel and midfoot with fat layer exposed: Secondary | ICD-10-CM | POA: Diagnosis not present

## 2023-06-13 DIAGNOSIS — Z794 Long term (current) use of insulin: Secondary | ICD-10-CM | POA: Diagnosis not present

## 2023-06-13 DIAGNOSIS — F17210 Nicotine dependence, cigarettes, uncomplicated: Secondary | ICD-10-CM | POA: Diagnosis not present

## 2023-06-13 DIAGNOSIS — T8130XD Disruption of wound, unspecified, subsequent encounter: Secondary | ICD-10-CM | POA: Diagnosis not present

## 2023-06-13 DIAGNOSIS — K5909 Other constipation: Secondary | ICD-10-CM | POA: Diagnosis not present

## 2023-06-13 DIAGNOSIS — E039 Hypothyroidism, unspecified: Secondary | ICD-10-CM | POA: Diagnosis not present

## 2023-06-13 DIAGNOSIS — E1142 Type 2 diabetes mellitus with diabetic polyneuropathy: Secondary | ICD-10-CM | POA: Diagnosis not present

## 2023-06-13 DIAGNOSIS — M542 Cervicalgia: Secondary | ICD-10-CM | POA: Diagnosis not present

## 2023-06-13 DIAGNOSIS — G894 Chronic pain syndrome: Secondary | ICD-10-CM | POA: Diagnosis not present

## 2023-06-13 DIAGNOSIS — J449 Chronic obstructive pulmonary disease, unspecified: Secondary | ICD-10-CM | POA: Diagnosis not present

## 2023-06-13 DIAGNOSIS — G43909 Migraine, unspecified, not intractable, without status migrainosus: Secondary | ICD-10-CM | POA: Diagnosis not present

## 2023-06-13 DIAGNOSIS — M545 Low back pain, unspecified: Secondary | ICD-10-CM | POA: Diagnosis not present

## 2023-06-13 DIAGNOSIS — I10 Essential (primary) hypertension: Secondary | ICD-10-CM | POA: Diagnosis not present

## 2023-06-13 DIAGNOSIS — M109 Gout, unspecified: Secondary | ICD-10-CM | POA: Diagnosis not present

## 2023-06-19 ENCOUNTER — Encounter: Payer: Self-pay | Admitting: Internal Medicine

## 2023-06-19 DIAGNOSIS — E039 Hypothyroidism, unspecified: Secondary | ICD-10-CM | POA: Diagnosis not present

## 2023-06-19 DIAGNOSIS — I10 Essential (primary) hypertension: Secondary | ICD-10-CM | POA: Diagnosis not present

## 2023-06-19 DIAGNOSIS — K5909 Other constipation: Secondary | ICD-10-CM | POA: Diagnosis not present

## 2023-06-19 DIAGNOSIS — M545 Low back pain, unspecified: Secondary | ICD-10-CM | POA: Diagnosis not present

## 2023-06-19 DIAGNOSIS — L97412 Non-pressure chronic ulcer of right heel and midfoot with fat layer exposed: Secondary | ICD-10-CM | POA: Diagnosis not present

## 2023-06-19 DIAGNOSIS — M542 Cervicalgia: Secondary | ICD-10-CM | POA: Diagnosis not present

## 2023-06-19 DIAGNOSIS — G894 Chronic pain syndrome: Secondary | ICD-10-CM | POA: Diagnosis not present

## 2023-06-19 DIAGNOSIS — J449 Chronic obstructive pulmonary disease, unspecified: Secondary | ICD-10-CM | POA: Diagnosis not present

## 2023-06-19 DIAGNOSIS — T8130XD Disruption of wound, unspecified, subsequent encounter: Secondary | ICD-10-CM | POA: Diagnosis not present

## 2023-06-19 DIAGNOSIS — F17210 Nicotine dependence, cigarettes, uncomplicated: Secondary | ICD-10-CM | POA: Diagnosis not present

## 2023-06-19 DIAGNOSIS — E1142 Type 2 diabetes mellitus with diabetic polyneuropathy: Secondary | ICD-10-CM | POA: Diagnosis not present

## 2023-06-19 DIAGNOSIS — G43909 Migraine, unspecified, not intractable, without status migrainosus: Secondary | ICD-10-CM | POA: Diagnosis not present

## 2023-06-19 DIAGNOSIS — M109 Gout, unspecified: Secondary | ICD-10-CM | POA: Diagnosis not present

## 2023-06-19 DIAGNOSIS — Z794 Long term (current) use of insulin: Secondary | ICD-10-CM | POA: Diagnosis not present

## 2023-06-22 DIAGNOSIS — Z794 Long term (current) use of insulin: Secondary | ICD-10-CM | POA: Diagnosis not present

## 2023-06-22 DIAGNOSIS — E039 Hypothyroidism, unspecified: Secondary | ICD-10-CM | POA: Diagnosis not present

## 2023-06-22 DIAGNOSIS — M545 Low back pain, unspecified: Secondary | ICD-10-CM | POA: Diagnosis not present

## 2023-06-22 DIAGNOSIS — J449 Chronic obstructive pulmonary disease, unspecified: Secondary | ICD-10-CM | POA: Diagnosis not present

## 2023-06-22 DIAGNOSIS — F17210 Nicotine dependence, cigarettes, uncomplicated: Secondary | ICD-10-CM | POA: Diagnosis not present

## 2023-06-22 DIAGNOSIS — G43909 Migraine, unspecified, not intractable, without status migrainosus: Secondary | ICD-10-CM | POA: Diagnosis not present

## 2023-06-22 DIAGNOSIS — G894 Chronic pain syndrome: Secondary | ICD-10-CM | POA: Diagnosis not present

## 2023-06-22 DIAGNOSIS — M109 Gout, unspecified: Secondary | ICD-10-CM | POA: Diagnosis not present

## 2023-06-22 DIAGNOSIS — M542 Cervicalgia: Secondary | ICD-10-CM | POA: Diagnosis not present

## 2023-06-22 DIAGNOSIS — T8130XD Disruption of wound, unspecified, subsequent encounter: Secondary | ICD-10-CM | POA: Diagnosis not present

## 2023-06-22 DIAGNOSIS — L97412 Non-pressure chronic ulcer of right heel and midfoot with fat layer exposed: Secondary | ICD-10-CM | POA: Diagnosis not present

## 2023-06-22 DIAGNOSIS — I10 Essential (primary) hypertension: Secondary | ICD-10-CM | POA: Diagnosis not present

## 2023-06-22 DIAGNOSIS — E1142 Type 2 diabetes mellitus with diabetic polyneuropathy: Secondary | ICD-10-CM | POA: Diagnosis not present

## 2023-06-22 DIAGNOSIS — K5909 Other constipation: Secondary | ICD-10-CM | POA: Diagnosis not present

## 2023-06-26 DIAGNOSIS — E1142 Type 2 diabetes mellitus with diabetic polyneuropathy: Secondary | ICD-10-CM | POA: Diagnosis not present

## 2023-06-26 DIAGNOSIS — I10 Essential (primary) hypertension: Secondary | ICD-10-CM | POA: Diagnosis not present

## 2023-06-26 DIAGNOSIS — Z794 Long term (current) use of insulin: Secondary | ICD-10-CM | POA: Diagnosis not present

## 2023-06-26 DIAGNOSIS — T8130XD Disruption of wound, unspecified, subsequent encounter: Secondary | ICD-10-CM | POA: Diagnosis not present

## 2023-06-26 DIAGNOSIS — G43909 Migraine, unspecified, not intractable, without status migrainosus: Secondary | ICD-10-CM | POA: Diagnosis not present

## 2023-06-26 DIAGNOSIS — J449 Chronic obstructive pulmonary disease, unspecified: Secondary | ICD-10-CM | POA: Diagnosis not present

## 2023-06-26 DIAGNOSIS — M109 Gout, unspecified: Secondary | ICD-10-CM | POA: Diagnosis not present

## 2023-06-26 DIAGNOSIS — G894 Chronic pain syndrome: Secondary | ICD-10-CM | POA: Diagnosis not present

## 2023-06-26 DIAGNOSIS — K5909 Other constipation: Secondary | ICD-10-CM | POA: Diagnosis not present

## 2023-06-26 DIAGNOSIS — L97412 Non-pressure chronic ulcer of right heel and midfoot with fat layer exposed: Secondary | ICD-10-CM | POA: Diagnosis not present

## 2023-06-26 DIAGNOSIS — E039 Hypothyroidism, unspecified: Secondary | ICD-10-CM | POA: Diagnosis not present

## 2023-06-26 DIAGNOSIS — M545 Low back pain, unspecified: Secondary | ICD-10-CM | POA: Diagnosis not present

## 2023-06-26 DIAGNOSIS — F17210 Nicotine dependence, cigarettes, uncomplicated: Secondary | ICD-10-CM | POA: Diagnosis not present

## 2023-06-26 DIAGNOSIS — M542 Cervicalgia: Secondary | ICD-10-CM | POA: Diagnosis not present

## 2023-06-28 ENCOUNTER — Ambulatory Visit (INDEPENDENT_AMBULATORY_CARE_PROVIDER_SITE_OTHER): Payer: 59 | Admitting: Podiatry

## 2023-06-28 DIAGNOSIS — E11621 Type 2 diabetes mellitus with foot ulcer: Secondary | ICD-10-CM

## 2023-06-28 DIAGNOSIS — T8130XA Disruption of wound, unspecified, initial encounter: Secondary | ICD-10-CM

## 2023-06-28 DIAGNOSIS — L97412 Non-pressure chronic ulcer of right heel and midfoot with fat layer exposed: Secondary | ICD-10-CM

## 2023-06-28 NOTE — Progress Notes (Signed)
Subjective:  Patient ID: Katherine Herrera, female    DOB: 24-May-1966,  MRN: [id number]  Chief Complaint  Patient presents with   Diabetic Ulcer    DOS: 02/27/2023 Procedure: Right posterior tibial tendon lengthening Achilles tendon lengthening and tenodesis of the peroneal tendon  57 y.o. female returns for post-op check.  Patient states she is doing okay.  No nausea fever chills vomiting.  She states outside part of the foot hurts a little bit.  Review of Systems: Negative except as noted in the HPI. Denies N/V/F/Ch.  Past Medical History:  Diagnosis Date   Anxiety    Arthritis    hands   B12 deficiency    Chronic constipation    Chronic pain syndrome    neck, lower back, and foot   COPD (chronic obstructive pulmonary disease) (HCC)    Diabetic ulcer of right foot (HCC)    Gout    possible   History of seizure    05-26-2020  per pt as child and last one in late teen's   History of sepsis    due to lower extremity cellulits 2019 left , right 03/ 2021   History of syncope    Hyperlipidemia    Hypothyroidism    Impaired range of motion of cervical spine    per pt hx cervical fusion C4 -- 7,  has to be careful about turning her head to the right can cause her to pass out   Insulin dependent type 2 diabetes mellitus [facility])    endocrinologist--- dr Talmage Nap    (05-26-2020 per pt check's blood sugar twice dialy and at times a third time,  fasting am blood sugar's---- 200 -- 250)   LBP (low back pain)    DR Noel Gerold   Migraine    Peripheral neuropathy    RBBB (right bundle branch block)    Tenosynovitis of foot and ankle 09/03/2013   TTS (tarsal tunnel syndrome) 2010   Left Foot [facility] in [place]   Vitamin D deficiency    Wears glasses     Current Outpatient Medications:    albuterol (VENTOLIN HFA) 108 (90 Base) MCG/ACT inhaler, Inhale 1 puff into the lungs every 6 (six) hours as needed for shortness of breath., Disp: , Rfl:    aspirin 325 MG tablet, Take 325 mg by mouth daily., Disp:  , Rfl:    B-D INS SYR ULTRAFINE 1CC/31G 31G X 5/16" 1 ML MISC, USE AS DIRECTED, Disp: 100 each, Rfl: 0   baclofen (LIORESAL) 10 MG tablet, TAKE 2 TABLETS BY MOUTH TWICE  DAILY, Disp: 180 tablet, Rfl: 1   bisacodyl (DULCOLAX) 5 MG EC tablet, Take 10 mg by mouth at bedtime., Disp: , Rfl:    Blood Glucose Monitoring Suppl (ONETOUCH VERIO) w/Device KIT, , Disp: , Rfl:    celecoxib (CELEBREX) 200 MG capsule, Take 1 capsule (200 mg total) by mouth 2 (two) times daily as needed. TAKE 1 CAPSULE TWICE DAILY AS NEEDED FOR PAIN WITH FOOD, Disp: 60 capsule, Rfl: 0   cholecalciferol (VITAMIN D3) 25 MCG (1000 UNIT) tablet, Take 1,000 Units by mouth daily., Disp: , Rfl:    collagenase (SANTYL) 250 UNIT/GM ointment, Apply 1 Application topically daily., Disp: 15 g, Rfl: 0   Continuous Blood Gluc Sensor (FREESTYLE LIBRE 2 SENSOR) MISC, as directed subcutaneous every 14 days, Disp: , Rfl:    docusate sodium (COLACE) 100 MG capsule, Take 100 mg by mouth at bedtime., Disp: , Rfl:    doxycycline (VIBRA-TABS) 100 MG  tablet, Take 1 tablet (100 mg total) by mouth 2 (two) times daily., Disp: 60 tablet, Rfl: 0   ezetimibe (ZETIA) 10 MG tablet, TAKE 1 TABLET BY MOUTH DAILY (Patient taking differently: Take 10 mg by mouth daily.), Disp: 100 tablet, Rfl: 2   FLUoxetine (PROZAC) 10 MG capsule, TAKE 1 CAPSULE BY MOUTH EVERY DAY (Patient taking differently: Take 10 mg by mouth daily.), Disp: 90 capsule, Rfl: 2   gabapentin (NEURONTIN) 800 MG tablet, TAKE 1 TABLET BY MOUTH 4 TIMES  DAILY (Patient taking differently: Take 800 mg by mouth 3 (three) times daily.), Disp: 400 tablet, Rfl: 2   ibuprofen (ADVIL) 800 MG tablet, Take 1 tablet (800 mg total) by mouth every 6 (six) hours as needed., Disp: 60 tablet, Rfl: 1   insulin NPH-regular Human (70-30) 100 UNIT/ML injection, Inject 20 Units into the skin 2 (two) times daily with a meal. (Patient taking differently: Inject 30-50 Units into the skin See admin instructions. Inject 30 units  SQ in the morning and afternoon then inject 50 units SQ at night), Disp: 10 mL, Rfl: 11   lamoTRIgine (LAMICTAL) 25 MG tablet, TAKE 1 TABLET BY MOUTH TWICE  DAILY, Disp: 200 tablet, Rfl: 2   Lancet Devices (ACCU-CHEK SOFTCLIX) lancets, Test two times daily, Disp: 100 each, Rfl: 12   Lancets (ONETOUCH DELICA PLUS LANCET33G) MISC, , Disp: , Rfl:    levothyroxine (SYNTHROID) 75 MCG tablet, TAKE 1 TABLET BY MOUTH DAILY  BEFORE BREAKFAST, Disp: 100 tablet, Rfl: 2   lisinopril (ZESTRIL) 10 MG tablet, TAKE 1 TABLET BY MOUTH DAILY (Patient taking differently: Take 10 mg by mouth daily.), Disp: 100 tablet, Rfl: 2   methylPREDNISolone (MEDROL DOSEPAK) 4 MG TBPK tablet, As directed, Disp: 21 tablet, Rfl: 0   ondansetron (ZOFRAN) 4 MG tablet, Take 4 mg by mouth every 8 (eight) hours as needed for nausea or vomiting., Disp: , Rfl:    ONETOUCH VERIO test strip, 1 each 3 (three) times daily., Disp: , Rfl:    oxyCODONE (ROXICODONE) 15 MG immediate release tablet, Take 1 tablet (15 mg total) by mouth every 6 (six) hours as needed for pain., Disp: 120 tablet, Rfl: 0   oxyCODONE-acetaminophen (PERCOCET) 5-325 MG tablet, Take 1 tablet by mouth every 4 (four) hours as needed for severe pain., Disp: 30 tablet, Rfl: 0   rOPINIRole (REQUIP) 0.5 MG tablet, TAKE 1 TABLET BY MOUTH AT  BEDTIME, Disp: 100 tablet, Rfl: 2   sodium hypochlorite (DAKIN'S 1/2 STRENGTH) external solution, Irrigate with 1 Application as directed daily., Disp: 437 mL, Rfl: 0   vitamin B-12 (CYANOCOBALAMIN) 1000 MCG tablet, Take 1,000 mcg by mouth daily., Disp: , Rfl:   Social History   Tobacco Use  Smoking Status Every Day   Current packs/day: 1.00   Average packs/day: 1 pack/day for 10.0 years (10.0 ttl pk-yrs)   Types: Cigarettes  Smokeless Tobacco Never  Tobacco Comments   Trying to quit, smoking 1 ppd    Allergies  Allergen Reactions   Cephalexin Nausea And Vomiting   Demerol  [Meperidine Hcl] Rash   Lovastatin Other (See Comments)     Possible myalgia    Metformin And Related Nausea And Vomiting   Sulfamethoxazole-Trimethoprim     Other reaction(s): Unknown DILI, pancreatitis   Crestor [Rosuvastatin Calcium]     myalgia   Hydromorphone     Other reaction(s): Unknown   Niacin Other (See Comments)    Unknown    Other Other (See Comments)   Hydromorphone Hcl  Itching    Patient has been tolerating Hydromorphone tablets without adverse effect (07/09/19) Other reaction(s): Unknown   Paroxetine Other (See Comments)    Unknown    Objective:  There were no vitals filed for this visit. There is no height or weight on file to calculate BMI. Constitutional Well developed. Well nourished.  Vascular Foot warm and well perfused. Capillary refill normal to all digits.   Neurologic Normal speech. Oriented to person, place, and time. Epicritic sensation to light touch grossly present bilaterally.  Dermatologic Skin healing well without signs of infection. Skin edges well coapted without signs of infection.  Wound dehiscence is improving as well.  Continue local wound care.  Orthopedic: Mild tenderness to palpation noted about the surgical site.   Radiographs: None Assessment:   No diagnosis found.  Plan:  Patient was evaluated and treated and all questions answered.  S/p foot surgery right -Progressing as expected post-operatively. -XR: None -WB Status: Nonweightbearing in right lower extremity -Sutures: Removed.  Deis is noted to the right lateral foot at the medial and the posterior incision healed completely without problems -Medications: Doxycycline for skin and soft tissue prophylaxis -I encouraged her to do Betadine wet-to-dry dressing every day and if the wound gets worse on the right lateral foot patient will see and go to the emergency room. -If there is no improvement we will discuss wound care center -Continue doxycyclineAntibiotics - home health set up for santyl wet to dry  -Wound measurement 0.3 x 0.2  which is improving considerably

## 2023-06-29 ENCOUNTER — Other Ambulatory Visit: Payer: Self-pay | Admitting: Internal Medicine

## 2023-06-29 DIAGNOSIS — G894 Chronic pain syndrome: Secondary | ICD-10-CM | POA: Diagnosis not present

## 2023-06-29 DIAGNOSIS — L97412 Non-pressure chronic ulcer of right heel and midfoot with fat layer exposed: Secondary | ICD-10-CM | POA: Diagnosis not present

## 2023-06-29 DIAGNOSIS — I10 Essential (primary) hypertension: Secondary | ICD-10-CM | POA: Diagnosis not present

## 2023-06-29 DIAGNOSIS — K5909 Other constipation: Secondary | ICD-10-CM | POA: Diagnosis not present

## 2023-06-29 DIAGNOSIS — G43909 Migraine, unspecified, not intractable, without status migrainosus: Secondary | ICD-10-CM | POA: Diagnosis not present

## 2023-06-29 DIAGNOSIS — J449 Chronic obstructive pulmonary disease, unspecified: Secondary | ICD-10-CM | POA: Diagnosis not present

## 2023-06-29 DIAGNOSIS — M542 Cervicalgia: Secondary | ICD-10-CM | POA: Diagnosis not present

## 2023-06-29 DIAGNOSIS — E1142 Type 2 diabetes mellitus with diabetic polyneuropathy: Secondary | ICD-10-CM | POA: Diagnosis not present

## 2023-06-29 DIAGNOSIS — T8130XD Disruption of wound, unspecified, subsequent encounter: Secondary | ICD-10-CM | POA: Diagnosis not present

## 2023-06-29 DIAGNOSIS — M545 Low back pain, unspecified: Secondary | ICD-10-CM | POA: Diagnosis not present

## 2023-06-29 DIAGNOSIS — F17210 Nicotine dependence, cigarettes, uncomplicated: Secondary | ICD-10-CM | POA: Diagnosis not present

## 2023-06-29 DIAGNOSIS — M109 Gout, unspecified: Secondary | ICD-10-CM | POA: Diagnosis not present

## 2023-06-29 DIAGNOSIS — Z794 Long term (current) use of insulin: Secondary | ICD-10-CM | POA: Diagnosis not present

## 2023-06-29 DIAGNOSIS — E039 Hypothyroidism, unspecified: Secondary | ICD-10-CM | POA: Diagnosis not present

## 2023-07-04 DIAGNOSIS — K5909 Other constipation: Secondary | ICD-10-CM | POA: Diagnosis not present

## 2023-07-04 DIAGNOSIS — Z794 Long term (current) use of insulin: Secondary | ICD-10-CM | POA: Diagnosis not present

## 2023-07-04 DIAGNOSIS — M545 Low back pain, unspecified: Secondary | ICD-10-CM | POA: Diagnosis not present

## 2023-07-04 DIAGNOSIS — G894 Chronic pain syndrome: Secondary | ICD-10-CM | POA: Diagnosis not present

## 2023-07-04 DIAGNOSIS — I10 Essential (primary) hypertension: Secondary | ICD-10-CM | POA: Diagnosis not present

## 2023-07-04 DIAGNOSIS — M542 Cervicalgia: Secondary | ICD-10-CM | POA: Diagnosis not present

## 2023-07-04 DIAGNOSIS — G43909 Migraine, unspecified, not intractable, without status migrainosus: Secondary | ICD-10-CM | POA: Diagnosis not present

## 2023-07-04 DIAGNOSIS — T8130XD Disruption of wound, unspecified, subsequent encounter: Secondary | ICD-10-CM | POA: Diagnosis not present

## 2023-07-04 DIAGNOSIS — F17210 Nicotine dependence, cigarettes, uncomplicated: Secondary | ICD-10-CM | POA: Diagnosis not present

## 2023-07-04 DIAGNOSIS — E039 Hypothyroidism, unspecified: Secondary | ICD-10-CM | POA: Diagnosis not present

## 2023-07-04 DIAGNOSIS — L97412 Non-pressure chronic ulcer of right heel and midfoot with fat layer exposed: Secondary | ICD-10-CM | POA: Diagnosis not present

## 2023-07-04 DIAGNOSIS — E1142 Type 2 diabetes mellitus with diabetic polyneuropathy: Secondary | ICD-10-CM | POA: Diagnosis not present

## 2023-07-04 DIAGNOSIS — M109 Gout, unspecified: Secondary | ICD-10-CM | POA: Diagnosis not present

## 2023-07-04 DIAGNOSIS — J449 Chronic obstructive pulmonary disease, unspecified: Secondary | ICD-10-CM | POA: Diagnosis not present

## 2023-07-06 DIAGNOSIS — I10 Essential (primary) hypertension: Secondary | ICD-10-CM | POA: Diagnosis not present

## 2023-07-06 DIAGNOSIS — F17210 Nicotine dependence, cigarettes, uncomplicated: Secondary | ICD-10-CM | POA: Diagnosis not present

## 2023-07-06 DIAGNOSIS — E039 Hypothyroidism, unspecified: Secondary | ICD-10-CM | POA: Diagnosis not present

## 2023-07-06 DIAGNOSIS — J449 Chronic obstructive pulmonary disease, unspecified: Secondary | ICD-10-CM | POA: Diagnosis not present

## 2023-07-06 DIAGNOSIS — L97412 Non-pressure chronic ulcer of right heel and midfoot with fat layer exposed: Secondary | ICD-10-CM | POA: Diagnosis not present

## 2023-07-06 DIAGNOSIS — E1142 Type 2 diabetes mellitus with diabetic polyneuropathy: Secondary | ICD-10-CM | POA: Diagnosis not present

## 2023-07-06 DIAGNOSIS — G43909 Migraine, unspecified, not intractable, without status migrainosus: Secondary | ICD-10-CM | POA: Diagnosis not present

## 2023-07-06 DIAGNOSIS — K5909 Other constipation: Secondary | ICD-10-CM | POA: Diagnosis not present

## 2023-07-06 DIAGNOSIS — M109 Gout, unspecified: Secondary | ICD-10-CM | POA: Diagnosis not present

## 2023-07-06 DIAGNOSIS — T8130XD Disruption of wound, unspecified, subsequent encounter: Secondary | ICD-10-CM | POA: Diagnosis not present

## 2023-07-06 DIAGNOSIS — M542 Cervicalgia: Secondary | ICD-10-CM | POA: Diagnosis not present

## 2023-07-06 DIAGNOSIS — Z794 Long term (current) use of insulin: Secondary | ICD-10-CM | POA: Diagnosis not present

## 2023-07-06 DIAGNOSIS — M545 Low back pain, unspecified: Secondary | ICD-10-CM | POA: Diagnosis not present

## 2023-07-06 DIAGNOSIS — G894 Chronic pain syndrome: Secondary | ICD-10-CM | POA: Diagnosis not present

## 2023-07-10 DIAGNOSIS — E1165 Type 2 diabetes mellitus with hyperglycemia: Secondary | ICD-10-CM | POA: Diagnosis not present

## 2023-07-11 ENCOUNTER — Ambulatory Visit (HOSPITAL_BASED_OUTPATIENT_CLINIC_OR_DEPARTMENT_OTHER): Payer: 59 | Admitting: General Surgery

## 2023-07-13 DIAGNOSIS — M545 Low back pain, unspecified: Secondary | ICD-10-CM | POA: Diagnosis not present

## 2023-07-13 DIAGNOSIS — T8130XD Disruption of wound, unspecified, subsequent encounter: Secondary | ICD-10-CM | POA: Diagnosis not present

## 2023-07-13 DIAGNOSIS — G894 Chronic pain syndrome: Secondary | ICD-10-CM | POA: Diagnosis not present

## 2023-07-13 DIAGNOSIS — E1142 Type 2 diabetes mellitus with diabetic polyneuropathy: Secondary | ICD-10-CM | POA: Diagnosis not present

## 2023-07-13 DIAGNOSIS — Z794 Long term (current) use of insulin: Secondary | ICD-10-CM | POA: Diagnosis not present

## 2023-07-13 DIAGNOSIS — F17210 Nicotine dependence, cigarettes, uncomplicated: Secondary | ICD-10-CM | POA: Diagnosis not present

## 2023-07-13 DIAGNOSIS — L97412 Non-pressure chronic ulcer of right heel and midfoot with fat layer exposed: Secondary | ICD-10-CM | POA: Diagnosis not present

## 2023-07-13 DIAGNOSIS — E039 Hypothyroidism, unspecified: Secondary | ICD-10-CM | POA: Diagnosis not present

## 2023-07-13 DIAGNOSIS — K5909 Other constipation: Secondary | ICD-10-CM | POA: Diagnosis not present

## 2023-07-13 DIAGNOSIS — M109 Gout, unspecified: Secondary | ICD-10-CM | POA: Diagnosis not present

## 2023-07-13 DIAGNOSIS — M542 Cervicalgia: Secondary | ICD-10-CM | POA: Diagnosis not present

## 2023-07-13 DIAGNOSIS — I10 Essential (primary) hypertension: Secondary | ICD-10-CM | POA: Diagnosis not present

## 2023-07-13 DIAGNOSIS — J449 Chronic obstructive pulmonary disease, unspecified: Secondary | ICD-10-CM | POA: Diagnosis not present

## 2023-07-13 DIAGNOSIS — G43909 Migraine, unspecified, not intractable, without status migrainosus: Secondary | ICD-10-CM | POA: Diagnosis not present

## 2023-07-19 DIAGNOSIS — G43909 Migraine, unspecified, not intractable, without status migrainosus: Secondary | ICD-10-CM | POA: Diagnosis not present

## 2023-07-19 DIAGNOSIS — E538 Deficiency of other specified B group vitamins: Secondary | ICD-10-CM | POA: Diagnosis not present

## 2023-07-19 DIAGNOSIS — J449 Chronic obstructive pulmonary disease, unspecified: Secondary | ICD-10-CM | POA: Diagnosis not present

## 2023-07-19 DIAGNOSIS — M545 Low back pain, unspecified: Secondary | ICD-10-CM | POA: Diagnosis not present

## 2023-07-19 DIAGNOSIS — E11621 Type 2 diabetes mellitus with foot ulcer: Secondary | ICD-10-CM | POA: Diagnosis not present

## 2023-07-19 DIAGNOSIS — I10 Essential (primary) hypertension: Secondary | ICD-10-CM | POA: Diagnosis not present

## 2023-07-19 DIAGNOSIS — E1142 Type 2 diabetes mellitus with diabetic polyneuropathy: Secondary | ICD-10-CM | POA: Diagnosis not present

## 2023-07-19 DIAGNOSIS — L97412 Non-pressure chronic ulcer of right heel and midfoot with fat layer exposed: Secondary | ICD-10-CM | POA: Diagnosis not present

## 2023-07-19 DIAGNOSIS — E039 Hypothyroidism, unspecified: Secondary | ICD-10-CM | POA: Diagnosis not present

## 2023-07-19 DIAGNOSIS — F17210 Nicotine dependence, cigarettes, uncomplicated: Secondary | ICD-10-CM | POA: Diagnosis not present

## 2023-07-19 DIAGNOSIS — M542 Cervicalgia: Secondary | ICD-10-CM | POA: Diagnosis not present

## 2023-07-19 DIAGNOSIS — K5909 Other constipation: Secondary | ICD-10-CM | POA: Diagnosis not present

## 2023-07-19 DIAGNOSIS — G894 Chronic pain syndrome: Secondary | ICD-10-CM | POA: Diagnosis not present

## 2023-07-19 DIAGNOSIS — M109 Gout, unspecified: Secondary | ICD-10-CM | POA: Diagnosis not present

## 2023-07-19 DIAGNOSIS — Z794 Long term (current) use of insulin: Secondary | ICD-10-CM | POA: Diagnosis not present

## 2023-07-25 DIAGNOSIS — E11621 Type 2 diabetes mellitus with foot ulcer: Secondary | ICD-10-CM | POA: Diagnosis not present

## 2023-07-25 DIAGNOSIS — E1142 Type 2 diabetes mellitus with diabetic polyneuropathy: Secondary | ICD-10-CM | POA: Diagnosis not present

## 2023-07-25 DIAGNOSIS — M542 Cervicalgia: Secondary | ICD-10-CM | POA: Diagnosis not present

## 2023-07-25 DIAGNOSIS — G43909 Migraine, unspecified, not intractable, without status migrainosus: Secondary | ICD-10-CM | POA: Diagnosis not present

## 2023-07-25 DIAGNOSIS — M109 Gout, unspecified: Secondary | ICD-10-CM | POA: Diagnosis not present

## 2023-07-25 DIAGNOSIS — F17210 Nicotine dependence, cigarettes, uncomplicated: Secondary | ICD-10-CM | POA: Diagnosis not present

## 2023-07-25 DIAGNOSIS — I10 Essential (primary) hypertension: Secondary | ICD-10-CM | POA: Diagnosis not present

## 2023-07-25 DIAGNOSIS — G894 Chronic pain syndrome: Secondary | ICD-10-CM | POA: Diagnosis not present

## 2023-07-25 DIAGNOSIS — M545 Low back pain, unspecified: Secondary | ICD-10-CM | POA: Diagnosis not present

## 2023-07-25 DIAGNOSIS — L97412 Non-pressure chronic ulcer of right heel and midfoot with fat layer exposed: Secondary | ICD-10-CM | POA: Diagnosis not present

## 2023-07-25 DIAGNOSIS — E039 Hypothyroidism, unspecified: Secondary | ICD-10-CM | POA: Diagnosis not present

## 2023-07-25 DIAGNOSIS — J449 Chronic obstructive pulmonary disease, unspecified: Secondary | ICD-10-CM | POA: Diagnosis not present

## 2023-07-25 DIAGNOSIS — E538 Deficiency of other specified B group vitamins: Secondary | ICD-10-CM | POA: Diagnosis not present

## 2023-07-25 DIAGNOSIS — Z794 Long term (current) use of insulin: Secondary | ICD-10-CM | POA: Diagnosis not present

## 2023-07-25 DIAGNOSIS — K5909 Other constipation: Secondary | ICD-10-CM | POA: Diagnosis not present

## 2023-07-31 DIAGNOSIS — M109 Gout, unspecified: Secondary | ICD-10-CM | POA: Diagnosis not present

## 2023-07-31 DIAGNOSIS — E11621 Type 2 diabetes mellitus with foot ulcer: Secondary | ICD-10-CM | POA: Diagnosis not present

## 2023-07-31 DIAGNOSIS — K5909 Other constipation: Secondary | ICD-10-CM | POA: Diagnosis not present

## 2023-07-31 DIAGNOSIS — I10 Essential (primary) hypertension: Secondary | ICD-10-CM | POA: Diagnosis not present

## 2023-07-31 DIAGNOSIS — F17210 Nicotine dependence, cigarettes, uncomplicated: Secondary | ICD-10-CM | POA: Diagnosis not present

## 2023-07-31 DIAGNOSIS — E538 Deficiency of other specified B group vitamins: Secondary | ICD-10-CM | POA: Diagnosis not present

## 2023-07-31 DIAGNOSIS — Z794 Long term (current) use of insulin: Secondary | ICD-10-CM | POA: Diagnosis not present

## 2023-07-31 DIAGNOSIS — L97412 Non-pressure chronic ulcer of right heel and midfoot with fat layer exposed: Secondary | ICD-10-CM | POA: Diagnosis not present

## 2023-07-31 DIAGNOSIS — M542 Cervicalgia: Secondary | ICD-10-CM | POA: Diagnosis not present

## 2023-07-31 DIAGNOSIS — E1142 Type 2 diabetes mellitus with diabetic polyneuropathy: Secondary | ICD-10-CM | POA: Diagnosis not present

## 2023-07-31 DIAGNOSIS — G43909 Migraine, unspecified, not intractable, without status migrainosus: Secondary | ICD-10-CM | POA: Diagnosis not present

## 2023-07-31 DIAGNOSIS — J449 Chronic obstructive pulmonary disease, unspecified: Secondary | ICD-10-CM | POA: Diagnosis not present

## 2023-07-31 DIAGNOSIS — G894 Chronic pain syndrome: Secondary | ICD-10-CM | POA: Diagnosis not present

## 2023-07-31 DIAGNOSIS — E039 Hypothyroidism, unspecified: Secondary | ICD-10-CM | POA: Diagnosis not present

## 2023-07-31 DIAGNOSIS — M545 Low back pain, unspecified: Secondary | ICD-10-CM | POA: Diagnosis not present

## 2023-08-04 ENCOUNTER — Other Ambulatory Visit: Payer: Self-pay | Admitting: Internal Medicine

## 2023-08-04 DIAGNOSIS — Z1212 Encounter for screening for malignant neoplasm of rectum: Secondary | ICD-10-CM

## 2023-08-04 DIAGNOSIS — Z1211 Encounter for screening for malignant neoplasm of colon: Secondary | ICD-10-CM

## 2023-08-06 ENCOUNTER — Other Ambulatory Visit: Payer: Self-pay | Admitting: Internal Medicine

## 2023-08-09 ENCOUNTER — Encounter: Payer: Self-pay | Admitting: Podiatry

## 2023-08-09 ENCOUNTER — Ambulatory Visit (INDEPENDENT_AMBULATORY_CARE_PROVIDER_SITE_OTHER): Payer: 59 | Admitting: Podiatry

## 2023-08-09 DIAGNOSIS — E1142 Type 2 diabetes mellitus with diabetic polyneuropathy: Secondary | ICD-10-CM | POA: Diagnosis not present

## 2023-08-09 DIAGNOSIS — J449 Chronic obstructive pulmonary disease, unspecified: Secondary | ICD-10-CM | POA: Diagnosis not present

## 2023-08-09 DIAGNOSIS — E538 Deficiency of other specified B group vitamins: Secondary | ICD-10-CM | POA: Diagnosis not present

## 2023-08-09 DIAGNOSIS — I10 Essential (primary) hypertension: Secondary | ICD-10-CM | POA: Diagnosis not present

## 2023-08-09 DIAGNOSIS — M542 Cervicalgia: Secondary | ICD-10-CM | POA: Diagnosis not present

## 2023-08-09 DIAGNOSIS — M545 Low back pain, unspecified: Secondary | ICD-10-CM | POA: Diagnosis not present

## 2023-08-09 DIAGNOSIS — E039 Hypothyroidism, unspecified: Secondary | ICD-10-CM | POA: Diagnosis not present

## 2023-08-09 DIAGNOSIS — M109 Gout, unspecified: Secondary | ICD-10-CM | POA: Diagnosis not present

## 2023-08-09 DIAGNOSIS — M216X1 Other acquired deformities of right foot: Secondary | ICD-10-CM | POA: Diagnosis not present

## 2023-08-09 DIAGNOSIS — E1165 Type 2 diabetes mellitus with hyperglycemia: Secondary | ICD-10-CM | POA: Diagnosis not present

## 2023-08-09 DIAGNOSIS — Z794 Long term (current) use of insulin: Secondary | ICD-10-CM | POA: Diagnosis not present

## 2023-08-09 DIAGNOSIS — G43909 Migraine, unspecified, not intractable, without status migrainosus: Secondary | ICD-10-CM | POA: Diagnosis not present

## 2023-08-09 DIAGNOSIS — L97412 Non-pressure chronic ulcer of right heel and midfoot with fat layer exposed: Secondary | ICD-10-CM | POA: Diagnosis not present

## 2023-08-09 DIAGNOSIS — G894 Chronic pain syndrome: Secondary | ICD-10-CM | POA: Diagnosis not present

## 2023-08-09 DIAGNOSIS — E11621 Type 2 diabetes mellitus with foot ulcer: Secondary | ICD-10-CM | POA: Diagnosis not present

## 2023-08-09 DIAGNOSIS — F17210 Nicotine dependence, cigarettes, uncomplicated: Secondary | ICD-10-CM | POA: Diagnosis not present

## 2023-08-09 DIAGNOSIS — K5909 Other constipation: Secondary | ICD-10-CM | POA: Diagnosis not present

## 2023-08-09 NOTE — Progress Notes (Signed)
Subjective:  Patient ID: Katherine Herrera, female    DOB: 07/19/1966,  MRN: [id number]  Chief Complaint  Patient presents with   Wound Check    "According to the nurse, they're doing good.  They've closed.  This boot is bothering me."   Diabetes    "I want to see if I can get a pair of Diabetic shoes made."    DOS: 02/27/2023 Procedure: Right posterior tibial tendon lengthening Achilles tendon lengthening and tenodesis of the peroneal tendon  57 y.o. female returns for post-op check.  Patient states she is doing okay.  No nausea fever chills vomiting.  She states outside part of the foot hurts a little bit.  Review of Systems: Negative except as noted in the HPI. Denies N/V/F/Ch.  Past Medical History:  Diagnosis Date   Anxiety    Arthritis    hands   B12 deficiency    Chronic constipation    Chronic pain syndrome    neck, lower back, and foot   COPD (chronic obstructive pulmonary disease) (HCC)    Diabetic ulcer of right foot (HCC)    Gout    possible   History of seizure    05-26-2020  per pt as child and last one in late teen's   History of sepsis    due to lower extremity cellulits 2019 left , right 03/ 2021   History of syncope    Hyperlipidemia    Hypothyroidism    Impaired range of motion of cervical spine    per pt hx cervical fusion C4 -- 7,  has to be careful about turning her head to the right can cause her to pass out   Insulin dependent type 2 diabetes mellitus [facility])    endocrinologist--- dr Talmage Nap    (05-26-2020 per pt check's blood sugar twice dialy and at times a third time,  fasting am blood sugar's---- 200 -- 250)   LBP (low back pain)    DR Noel Gerold   Migraine    Peripheral neuropathy    RBBB (right bundle branch block)    Tenosynovitis of foot and ankle 09/03/2013   TTS (tarsal tunnel syndrome) 2010   Left Foot [facility] in [place]   Vitamin D deficiency    Wears glasses     Current Outpatient Medications:    albuterol (VENTOLIN HFA) 108 (90 Base) MCG/ACT  inhaler, Inhale 1 puff into the lungs every 6 (six) hours as needed for shortness of breath., Disp: , Rfl:    aspirin 325 MG tablet, Take 325 mg by mouth daily., Disp: , Rfl:    B-D INS SYR ULTRAFINE 1CC/31G 31G X 5/16" 1 ML MISC, USE AS DIRECTED, Disp: 100 each, Rfl: 0   baclofen (LIORESAL) 10 MG tablet, TAKE 2 TABLETS BY MOUTH TWICE  DAILY, Disp: 180 tablet, Rfl: 1   bisacodyl (DULCOLAX) 5 MG EC tablet, Take 10 mg by mouth at bedtime., Disp: , Rfl:    Blood Glucose Monitoring Suppl (ONETOUCH VERIO) w/Device KIT, , Disp: , Rfl:    celecoxib (CELEBREX) 200 MG capsule, Take 1 capsule (200 mg total) by mouth 2 (two) times daily as needed. TAKE 1 CAPSULE TWICE DAILY AS NEEDED FOR PAIN WITH FOOD, Disp: 60 capsule, Rfl: 0   cholecalciferol (VITAMIN D3) 25 MCG (1000 UNIT) tablet, Take 1,000 Units by mouth daily., Disp: , Rfl:    collagenase (SANTYL) 250 UNIT/GM ointment, Apply 1 Application topically daily., Disp: 15 g, Rfl: 0   Continuous Blood Gluc Sensor (FREESTYLE  LIBRE 2 SENSOR) MISC, as directed subcutaneous every 14 days, Disp: , Rfl:    docusate sodium (COLACE) 100 MG capsule, Take 100 mg by mouth at bedtime., Disp: , Rfl:    doxycycline (VIBRA-TABS) 100 MG tablet, Take 1 tablet (100 mg total) by mouth 2 (two) times daily., Disp: 60 tablet, Rfl: 0   ezetimibe (ZETIA) 10 MG tablet, TAKE 1 TABLET BY MOUTH DAILY (Patient taking differently: Take 10 mg by mouth daily.), Disp: 100 tablet, Rfl: 2   FLUoxetine (PROZAC) 10 MG capsule, TAKE 1 CAPSULE BY MOUTH EVERY DAY (Patient taking differently: Take 10 mg by mouth daily.), Disp: 90 capsule, Rfl: 2   gabapentin (NEURONTIN) 800 MG tablet, TAKE 1 TABLET BY MOUTH 4 TIMES  DAILY, Disp: 400 tablet, Rfl: 2   ibuprofen (ADVIL) 800 MG tablet, Take 1 tablet (800 mg total) by mouth every 6 (six) hours as needed., Disp: 60 tablet, Rfl: 1   insulin NPH-regular Human (70-30) 100 UNIT/ML injection, Inject 20 Units into the skin 2 (two) times daily with a meal. (Patient  taking differently: Inject 30-50 Units into the skin See admin instructions. Inject 30 units SQ in the morning and afternoon then inject 50 units SQ at night), Disp: 10 mL, Rfl: 11   lamoTRIgine (LAMICTAL) 25 MG tablet, TAKE 1 TABLET BY MOUTH TWICE  DAILY, Disp: 200 tablet, Rfl: 2   Lancet Devices (ACCU-CHEK SOFTCLIX) lancets, Test two times daily, Disp: 100 each, Rfl: 12   Lancets (ONETOUCH DELICA PLUS LANCET33G) MISC, , Disp: , Rfl:    levothyroxine (SYNTHROID) 75 MCG tablet, TAKE 1 TABLET BY MOUTH DAILY  BEFORE BREAKFAST, Disp: 100 tablet, Rfl: 2   lisinopril (ZESTRIL) 10 MG tablet, TAKE 1 TABLET BY MOUTH DAILY (Patient taking differently: Take 10 mg by mouth daily.), Disp: 100 tablet, Rfl: 2   methylPREDNISolone (MEDROL DOSEPAK) 4 MG TBPK tablet, As directed, Disp: 21 tablet, Rfl: 0   ondansetron (ZOFRAN) 4 MG tablet, Take 4 mg by mouth every 8 (eight) hours as needed for nausea or vomiting., Disp: , Rfl:    ONETOUCH VERIO test strip, 1 each 3 (three) times daily., Disp: , Rfl:    oxyCODONE (ROXICODONE) 15 MG immediate release tablet, Take 1 tablet (15 mg total) by mouth every 6 (six) hours as needed for pain., Disp: 120 tablet, Rfl: 0   oxyCODONE-acetaminophen (PERCOCET) 5-325 MG tablet, Take 1 tablet by mouth every 4 (four) hours as needed for severe pain., Disp: 30 tablet, Rfl: 0   rOPINIRole (REQUIP) 0.5 MG tablet, TAKE 1 TABLET BY MOUTH AT  BEDTIME, Disp: 100 tablet, Rfl: 2   sodium hypochlorite (DAKIN'S 1/2 STRENGTH) external solution, Irrigate with 1 Application as directed daily., Disp: 437 mL, Rfl: 0   vitamin B-12 (CYANOCOBALAMIN) 1000 MCG tablet, Take 1,000 mcg by mouth daily., Disp: , Rfl:   Social History   Tobacco Use  Smoking Status Every Day   Current packs/day: 1.00   Average packs/day: 1 pack/day for 10.0 years (10.0 ttl pk-yrs)   Types: Cigarettes  Smokeless Tobacco Never  Tobacco Comments   Trying to quit, smoking 1 ppd    Allergies  Allergen Reactions    Cephalexin Nausea And Vomiting   Demerol  [Meperidine Hcl] Rash   Lovastatin Other (See Comments)    Possible myalgia    Metformin And Related Nausea And Vomiting   Sulfamethoxazole-Trimethoprim     Other reaction(s): Unknown DILI, pancreatitis   Crestor [Rosuvastatin Calcium]     myalgia   Hydromorphone  Other reaction(s): Unknown   Niacin Other (See Comments)    Unknown    Other Other (See Comments)   Hydromorphone Hcl Itching    Patient has been tolerating Hydromorphone tablets without adverse effect (07/09/19) Other reaction(s): Unknown   Paroxetine Other (See Comments)    Unknown    Objective:  There were no vitals filed for this visit. There is no height or weight on file to calculate BMI. Constitutional Well developed. Well nourished.  Vascular Foot warm and well perfused. Capillary refill normal to all digits.   Neurologic Normal speech. Oriented to person, place, and time. Epicritic sensation to light touch grossly present bilaterally.  Dermatologic Skin healing well without signs of infection. Skin edges well coapted without signs of infection.  Wound dehiscence is improving as well.  Continue local wound care.  Orthopedic: Mild tenderness to palpation noted about the surgical site.   Radiographs: None Assessment:   1. Cavovarus deformity of foot, acquired, right     Plan:  Patient was evaluated and treated and all questions answered.  S/p foot surgery right with underlying foot deformity -Wound clinically healed and completely epithelialized.  At this time patient can return to regular activities can start putting some weight down.  Given that she is a diabetic with a longstanding wound and deformity I believe she would benefit from diabetic shoes with offloading of the lateral aspect of the foot.  She states understanding she will see the orthotics department for diabetic shoes -

## 2023-08-15 ENCOUNTER — Other Ambulatory Visit: Payer: 59

## 2023-08-15 DIAGNOSIS — K5909 Other constipation: Secondary | ICD-10-CM | POA: Diagnosis not present

## 2023-08-15 DIAGNOSIS — E1142 Type 2 diabetes mellitus with diabetic polyneuropathy: Secondary | ICD-10-CM | POA: Diagnosis not present

## 2023-08-15 DIAGNOSIS — M542 Cervicalgia: Secondary | ICD-10-CM | POA: Diagnosis not present

## 2023-08-15 DIAGNOSIS — G43909 Migraine, unspecified, not intractable, without status migrainosus: Secondary | ICD-10-CM | POA: Diagnosis not present

## 2023-08-15 DIAGNOSIS — M545 Low back pain, unspecified: Secondary | ICD-10-CM | POA: Diagnosis not present

## 2023-08-15 DIAGNOSIS — J449 Chronic obstructive pulmonary disease, unspecified: Secondary | ICD-10-CM | POA: Diagnosis not present

## 2023-08-15 DIAGNOSIS — G894 Chronic pain syndrome: Secondary | ICD-10-CM | POA: Diagnosis not present

## 2023-08-15 DIAGNOSIS — F17210 Nicotine dependence, cigarettes, uncomplicated: Secondary | ICD-10-CM | POA: Diagnosis not present

## 2023-08-15 DIAGNOSIS — L97412 Non-pressure chronic ulcer of right heel and midfoot with fat layer exposed: Secondary | ICD-10-CM | POA: Diagnosis not present

## 2023-08-15 DIAGNOSIS — Z794 Long term (current) use of insulin: Secondary | ICD-10-CM | POA: Diagnosis not present

## 2023-08-15 DIAGNOSIS — I10 Essential (primary) hypertension: Secondary | ICD-10-CM | POA: Diagnosis not present

## 2023-08-15 DIAGNOSIS — M109 Gout, unspecified: Secondary | ICD-10-CM | POA: Diagnosis not present

## 2023-08-15 DIAGNOSIS — E11621 Type 2 diabetes mellitus with foot ulcer: Secondary | ICD-10-CM | POA: Diagnosis not present

## 2023-08-15 DIAGNOSIS — E039 Hypothyroidism, unspecified: Secondary | ICD-10-CM | POA: Diagnosis not present

## 2023-08-15 DIAGNOSIS — E538 Deficiency of other specified B group vitamins: Secondary | ICD-10-CM | POA: Diagnosis not present

## 2023-08-18 ENCOUNTER — Encounter: Payer: Self-pay | Admitting: Pharmacist

## 2023-08-18 NOTE — Progress Notes (Signed)
Pharmacy Quality Measure Review  This patient is appearing on report for being at risk of failing the measure for Statin Use in Persons with Diabetes (SUPD) medications this calendar year.   Prior trials of: rosuvastatin   G72.0 previously used, not yet this year. Added note on 09/06/23 appt for PCP to add G72.0 /statin exclusion code if applicable.  Arbutus Leas, PharmD, BCPS [facility] [phone]

## 2023-08-23 ENCOUNTER — Ambulatory Visit: Payer: 59

## 2023-08-23 DIAGNOSIS — E538 Deficiency of other specified B group vitamins: Secondary | ICD-10-CM | POA: Diagnosis not present

## 2023-08-23 DIAGNOSIS — L97412 Non-pressure chronic ulcer of right heel and midfoot with fat layer exposed: Secondary | ICD-10-CM | POA: Diagnosis not present

## 2023-08-23 DIAGNOSIS — E11621 Type 2 diabetes mellitus with foot ulcer: Secondary | ICD-10-CM

## 2023-08-23 DIAGNOSIS — M109 Gout, unspecified: Secondary | ICD-10-CM | POA: Diagnosis not present

## 2023-08-23 DIAGNOSIS — J449 Chronic obstructive pulmonary disease, unspecified: Secondary | ICD-10-CM | POA: Diagnosis not present

## 2023-08-23 DIAGNOSIS — M545 Low back pain, unspecified: Secondary | ICD-10-CM | POA: Diagnosis not present

## 2023-08-23 DIAGNOSIS — M542 Cervicalgia: Secondary | ICD-10-CM | POA: Diagnosis not present

## 2023-08-23 DIAGNOSIS — E039 Hypothyroidism, unspecified: Secondary | ICD-10-CM | POA: Diagnosis not present

## 2023-08-23 DIAGNOSIS — F17210 Nicotine dependence, cigarettes, uncomplicated: Secondary | ICD-10-CM | POA: Diagnosis not present

## 2023-08-23 DIAGNOSIS — K5909 Other constipation: Secondary | ICD-10-CM | POA: Diagnosis not present

## 2023-08-23 DIAGNOSIS — M24571 Contracture, right ankle: Secondary | ICD-10-CM

## 2023-08-23 DIAGNOSIS — M216X1 Other acquired deformities of right foot: Secondary | ICD-10-CM

## 2023-08-23 DIAGNOSIS — I10 Essential (primary) hypertension: Secondary | ICD-10-CM | POA: Diagnosis not present

## 2023-08-23 DIAGNOSIS — G894 Chronic pain syndrome: Secondary | ICD-10-CM | POA: Diagnosis not present

## 2023-08-23 DIAGNOSIS — Z794 Long term (current) use of insulin: Secondary | ICD-10-CM | POA: Diagnosis not present

## 2023-08-23 DIAGNOSIS — E1142 Type 2 diabetes mellitus with diabetic polyneuropathy: Secondary | ICD-10-CM | POA: Diagnosis not present

## 2023-08-23 DIAGNOSIS — G43909 Migraine, unspecified, not intractable, without status migrainosus: Secondary | ICD-10-CM | POA: Diagnosis not present

## 2023-08-23 NOTE — Progress Notes (Signed)
Patient presents to the office today for diabetic shoe and insole measuring.  Patient was measured with brannock device to determine size and width for 1 pair of extra depth shoes and foam casted for 3 pair of insoles.   Documentation of medical necessity will be sent to patient's treating diabetic doctor to verify and sign.   Patient's diabetic provider: Dr Dorisann Frames   Shoes and insoles will be ordered at that time and patient will be notified for an appointment for fitting when they arrive.   Shoe size (per patient): 10 Brannock measurement: 10 Patient shoe selection- Shoe choice:   981 / 986 Shoe size ordered: 10.5 XWD  Order placed on Web and scanner

## 2023-08-28 DIAGNOSIS — E1142 Type 2 diabetes mellitus with diabetic polyneuropathy: Secondary | ICD-10-CM | POA: Diagnosis not present

## 2023-08-28 DIAGNOSIS — I10 Essential (primary) hypertension: Secondary | ICD-10-CM | POA: Diagnosis not present

## 2023-08-28 DIAGNOSIS — E11621 Type 2 diabetes mellitus with foot ulcer: Secondary | ICD-10-CM | POA: Diagnosis not present

## 2023-08-28 DIAGNOSIS — G43909 Migraine, unspecified, not intractable, without status migrainosus: Secondary | ICD-10-CM | POA: Diagnosis not present

## 2023-08-28 DIAGNOSIS — K5909 Other constipation: Secondary | ICD-10-CM | POA: Diagnosis not present

## 2023-08-28 DIAGNOSIS — M542 Cervicalgia: Secondary | ICD-10-CM | POA: Diagnosis not present

## 2023-08-28 DIAGNOSIS — E538 Deficiency of other specified B group vitamins: Secondary | ICD-10-CM | POA: Diagnosis not present

## 2023-08-28 DIAGNOSIS — F17210 Nicotine dependence, cigarettes, uncomplicated: Secondary | ICD-10-CM | POA: Diagnosis not present

## 2023-08-28 DIAGNOSIS — G894 Chronic pain syndrome: Secondary | ICD-10-CM | POA: Diagnosis not present

## 2023-08-28 DIAGNOSIS — L97412 Non-pressure chronic ulcer of right heel and midfoot with fat layer exposed: Secondary | ICD-10-CM | POA: Diagnosis not present

## 2023-08-28 DIAGNOSIS — J449 Chronic obstructive pulmonary disease, unspecified: Secondary | ICD-10-CM | POA: Diagnosis not present

## 2023-08-28 DIAGNOSIS — M545 Low back pain, unspecified: Secondary | ICD-10-CM | POA: Diagnosis not present

## 2023-08-28 DIAGNOSIS — E039 Hypothyroidism, unspecified: Secondary | ICD-10-CM | POA: Diagnosis not present

## 2023-08-28 DIAGNOSIS — M109 Gout, unspecified: Secondary | ICD-10-CM | POA: Diagnosis not present

## 2023-08-28 DIAGNOSIS — Z794 Long term (current) use of insulin: Secondary | ICD-10-CM | POA: Diagnosis not present

## 2023-08-30 DIAGNOSIS — E11621 Type 2 diabetes mellitus with foot ulcer: Secondary | ICD-10-CM | POA: Diagnosis not present

## 2023-08-30 DIAGNOSIS — L97412 Non-pressure chronic ulcer of right heel and midfoot with fat layer exposed: Secondary | ICD-10-CM | POA: Diagnosis not present

## 2023-09-06 ENCOUNTER — Ambulatory Visit: Payer: 59 | Admitting: Internal Medicine

## 2023-09-07 DIAGNOSIS — E1142 Type 2 diabetes mellitus with diabetic polyneuropathy: Secondary | ICD-10-CM | POA: Diagnosis not present

## 2023-09-07 DIAGNOSIS — L97412 Non-pressure chronic ulcer of right heel and midfoot with fat layer exposed: Secondary | ICD-10-CM | POA: Diagnosis not present

## 2023-09-07 DIAGNOSIS — E538 Deficiency of other specified B group vitamins: Secondary | ICD-10-CM | POA: Diagnosis not present

## 2023-09-07 DIAGNOSIS — J449 Chronic obstructive pulmonary disease, unspecified: Secondary | ICD-10-CM | POA: Diagnosis not present

## 2023-09-07 DIAGNOSIS — E11621 Type 2 diabetes mellitus with foot ulcer: Secondary | ICD-10-CM | POA: Diagnosis not present

## 2023-09-07 DIAGNOSIS — I10 Essential (primary) hypertension: Secondary | ICD-10-CM | POA: Diagnosis not present

## 2023-09-07 DIAGNOSIS — M542 Cervicalgia: Secondary | ICD-10-CM | POA: Diagnosis not present

## 2023-09-07 DIAGNOSIS — Z794 Long term (current) use of insulin: Secondary | ICD-10-CM | POA: Diagnosis not present

## 2023-09-07 DIAGNOSIS — K5909 Other constipation: Secondary | ICD-10-CM | POA: Diagnosis not present

## 2023-09-07 DIAGNOSIS — F17210 Nicotine dependence, cigarettes, uncomplicated: Secondary | ICD-10-CM | POA: Diagnosis not present

## 2023-09-07 DIAGNOSIS — G894 Chronic pain syndrome: Secondary | ICD-10-CM | POA: Diagnosis not present

## 2023-09-07 DIAGNOSIS — E039 Hypothyroidism, unspecified: Secondary | ICD-10-CM | POA: Diagnosis not present

## 2023-09-07 DIAGNOSIS — M545 Low back pain, unspecified: Secondary | ICD-10-CM | POA: Diagnosis not present

## 2023-09-07 DIAGNOSIS — G43909 Migraine, unspecified, not intractable, without status migrainosus: Secondary | ICD-10-CM | POA: Diagnosis not present

## 2023-09-07 DIAGNOSIS — M109 Gout, unspecified: Secondary | ICD-10-CM | POA: Diagnosis not present

## 2023-09-09 DIAGNOSIS — E1165 Type 2 diabetes mellitus with hyperglycemia: Secondary | ICD-10-CM | POA: Diagnosis not present

## 2023-09-11 ENCOUNTER — Other Ambulatory Visit: Payer: Self-pay | Admitting: Internal Medicine

## 2023-09-14 ENCOUNTER — Ambulatory Visit: Payer: 59

## 2023-09-18 ENCOUNTER — Ambulatory Visit: Payer: 59 | Admitting: Internal Medicine

## 2023-09-27 ENCOUNTER — Ambulatory Visit: Payer: 59 | Admitting: Internal Medicine

## 2023-09-27 ENCOUNTER — Ambulatory Visit: Payer: 59

## 2023-10-02 ENCOUNTER — Other Ambulatory Visit: Payer: Self-pay | Admitting: Internal Medicine

## 2023-10-02 DIAGNOSIS — E1165 Type 2 diabetes mellitus with hyperglycemia: Secondary | ICD-10-CM | POA: Diagnosis not present

## 2023-10-02 DIAGNOSIS — E039 Hypothyroidism, unspecified: Secondary | ICD-10-CM | POA: Diagnosis not present

## 2023-10-03 ENCOUNTER — Other Ambulatory Visit: Payer: Self-pay

## 2023-10-03 DIAGNOSIS — N189 Chronic kidney disease, unspecified: Secondary | ICD-10-CM | POA: Diagnosis not present

## 2023-10-03 DIAGNOSIS — I10 Essential (primary) hypertension: Secondary | ICD-10-CM | POA: Diagnosis not present

## 2023-10-03 DIAGNOSIS — E7800 Pure hypercholesterolemia, unspecified: Secondary | ICD-10-CM | POA: Diagnosis not present

## 2023-10-03 DIAGNOSIS — E1165 Type 2 diabetes mellitus with hyperglycemia: Secondary | ICD-10-CM | POA: Diagnosis not present

## 2023-10-03 DIAGNOSIS — E1140 Type 2 diabetes mellitus with diabetic neuropathy, unspecified: Secondary | ICD-10-CM | POA: Diagnosis not present

## 2023-10-03 DIAGNOSIS — E039 Hypothyroidism, unspecified: Secondary | ICD-10-CM | POA: Diagnosis not present

## 2023-10-04 ENCOUNTER — Ambulatory Visit: Payer: 59 | Admitting: Internal Medicine

## 2023-10-04 ENCOUNTER — Encounter: Payer: Self-pay | Admitting: Internal Medicine

## 2023-10-04 VITALS — BP 120/62 | HR 87 | Temp 98.6°F | Ht 69.0 in | Wt 212.0 lb

## 2023-10-04 DIAGNOSIS — E039 Hypothyroidism, unspecified: Secondary | ICD-10-CM

## 2023-10-04 DIAGNOSIS — E1140 Type 2 diabetes mellitus with diabetic neuropathy, unspecified: Secondary | ICD-10-CM | POA: Diagnosis not present

## 2023-10-04 DIAGNOSIS — M5440 Lumbago with sciatica, unspecified side: Secondary | ICD-10-CM

## 2023-10-04 DIAGNOSIS — E538 Deficiency of other specified B group vitamins: Secondary | ICD-10-CM | POA: Diagnosis not present

## 2023-10-04 DIAGNOSIS — F419 Anxiety disorder, unspecified: Secondary | ICD-10-CM

## 2023-10-04 DIAGNOSIS — G8929 Other chronic pain: Secondary | ICD-10-CM

## 2023-10-04 MED ORDER — OXYCODONE HCL 15 MG PO TABS: 15.0000 mg | ORAL_TABLET | Freq: Four times a day (QID) | ORAL | 0 refills | Status: DC | PRN

## 2023-10-04 MED ORDER — CLONAZEPAM 0.5 MG PO TABS: 0.5000 mg | ORAL_TABLET | Freq: Two times a day (BID) | ORAL | 1 refills | Status: DC | PRN

## 2023-10-04 NOTE — Assessment & Plan Note (Signed)
On B12 

## 2023-10-04 NOTE — Assessment & Plan Note (Signed)
On Levothroid Dr Chalmers Cater

## 2023-10-04 NOTE — Assessment & Plan Note (Signed)
Chronic Occ panic attacks Cont on Clonazepam prn  Potential benefits of a long term benzodiazepines  use as well as potential risks  and complications were explained to the patient and were aknowledged.

## 2023-10-04 NOTE — Assessment & Plan Note (Signed)
On Oxycodone 15 mg qid prn  Potential benefits of a long term opioids use as well as potential risks (i.e. addiction risk, apnea etc) and complications (i.e. Somnolence, constipation and others) were explained to the patient and were aknowledged. Worse Using a knee scooter R leg is in the boot

## 2023-10-04 NOTE — Assessment & Plan Note (Signed)
Pt is seeing Endo - Dr Talmage Nap Labs were done last week A1c was 7.7%

## 2023-10-04 NOTE — Progress Notes (Signed)
Normal your Marina Goodell good appointment.  Thank you probably therapy  Subjective:  Patient ID: Katherine Herrera, female    DOB: Aug 12, 1966  Age: 57 y.o. MRN: [id number]  CC: Medical Management of Chronic Issues (3 mnth f/u)   HPI Katherine Herrera presents for medication renewal  F/u on DM, anxiety, LBP Pt is seeing Endo Labs were done last week A1c was 7.7%  Outpatient Medications Prior to Visit  Medication Sig Dispense Refill   albuterol (VENTOLIN HFA) 108 (90 Base) MCG/ACT inhaler Inhale 1 puff into the lungs every 6 (six) hours as needed for shortness of breath.     Alcohol Swabs (PHARMACIST CHOICE ALCOHOL) PADS      aspirin 325 MG tablet Take 325 mg by mouth daily.     B-D INS SYR ULTRAFINE 1CC/31G 31G X 5/16" 1 ML MISC USE AS DIRECTED 100 each 0   baclofen (LIORESAL) 10 MG tablet TAKE 2 TABLETS BY MOUTH TWICE  DAILY 180 tablet 1   bisacodyl (DULCOLAX) 5 MG EC tablet Take 10 mg by mouth at bedtime.     Blood Glucose Monitoring Suppl (ONETOUCH VERIO) w/Device KIT      celecoxib (CELEBREX) 200 MG capsule Take 1 capsule (200 mg total) by mouth 2 (two) times daily as needed. TAKE 1 CAPSULE TWICE DAILY AS NEEDED FOR PAIN WITH FOOD 60 capsule 0   cholecalciferol (VITAMIN D3) 25 MCG (1000 UNIT) tablet Take 1,000 Units by mouth daily.     collagenase (SANTYL) 250 UNIT/GM ointment Apply 1 Application topically daily. 15 g 0   Continuous Blood Gluc Sensor (FREESTYLE LIBRE 2 SENSOR) MISC as directed subcutaneous every 14 days     docusate sodium (COLACE) 100 MG capsule Take 100 mg by mouth at bedtime.     doxycycline (VIBRA-TABS) 100 MG tablet Take 1 tablet (100 mg total) by mouth 2 (two) times daily. 60 tablet 0   ezetimibe (ZETIA) 10 MG tablet TAKE 1 TABLET BY MOUTH DAILY 100 tablet 2   FLUoxetine (PROZAC) 10 MG capsule TAKE 1 CAPSULE BY MOUTH EVERY DAY (Patient taking differently: Take 10 mg by mouth daily.) 90 capsule 2   gabapentin (NEURONTIN) 800 MG tablet TAKE 1 TABLET BY MOUTH 4 TIMES  DAILY  400 tablet 2   ibuprofen (ADVIL) 800 MG tablet Take 1 tablet (800 mg total) by mouth every 6 (six) hours as needed. 60 tablet 1   insulin NPH-regular Human (70-30) 100 UNIT/ML injection Inject 20 Units into the skin 2 (two) times daily with a meal. (Patient taking differently: Inject 30-50 Units into the skin See admin instructions. Inject 30 units SQ in the morning and afternoon then inject 50 units SQ at night) 10 mL 11   lamoTRIgine (LAMICTAL) 25 MG tablet TAKE 1 TABLET BY MOUTH TWICE  DAILY 200 tablet 2   Lancet Devices (ACCU-CHEK SOFTCLIX) lancets Test two times daily 100 each 12   Lancets (ONETOUCH DELICA PLUS LANCET33G) MISC      levothyroxine (SYNTHROID) 75 MCG tablet TAKE 1 TABLET BY MOUTH DAILY  BEFORE BREAKFAST 100 tablet 2   lisinopril (ZESTRIL) 10 MG tablet TAKE 1 TABLET BY MOUTH DAILY 90 tablet 3   methylPREDNISolone (MEDROL DOSEPAK) 4 MG TBPK tablet As directed 21 tablet 0   ondansetron (ZOFRAN) 4 MG tablet Take 4 mg by mouth every 8 (eight) hours as needed for nausea or vomiting.     ONETOUCH VERIO test strip 1 each 3 (three) times daily.     rOPINIRole (REQUIP) 0.5 MG  tablet TAKE 1 TABLET BY MOUTH AT  BEDTIME 100 tablet 2   sodium hypochlorite (DAKIN'S 1/2 STRENGTH) external solution Irrigate with 1 Application as directed daily. 437 mL 0   vitamin B-12 (CYANOCOBALAMIN) 1000 MCG tablet Take 1,000 mcg by mouth daily.     oxyCODONE (ROXICODONE) 15 MG immediate release tablet Take 1 tablet (15 mg total) by mouth every 6 (six) hours as needed for pain. 120 tablet 0   oxyCODONE-acetaminophen (PERCOCET) 5-325 MG tablet Take 1 tablet by mouth every 4 (four) hours as needed for severe pain. 30 tablet 0   No facility-administered medications prior to visit.    ROS: Review of Systems  Constitutional:  Negative for activity change, appetite change, chills, fatigue and unexpected weight change.  HENT:  Negative for congestion, mouth sores and sinus pressure.   Eyes:  Negative for visual  disturbance.  Respiratory:  Negative for cough and chest tightness.   Gastrointestinal:  Negative for abdominal pain and nausea.  Genitourinary:  Negative for difficulty urinating, frequency and vaginal pain.  Musculoskeletal:  Positive for arthralgias, back pain and gait problem.  Skin:  Negative for pallor and rash.  Neurological:  Negative for dizziness, tremors, weakness, numbness and headaches.  Psychiatric/Behavioral:  Positive for dysphoric mood. Negative for confusion, decreased concentration, sleep disturbance and suicidal ideas. The patient is nervous/anxious.     Objective:  BP 120/62 (BP Location: Right Arm, Patient Position: Sitting, Cuff Size: Normal)   Pulse 87   Temp 98.6 F (37 C) (Oral)   Ht 5\' 9"  (1.753 m)   Wt 212 lb (96.2 kg)   SpO2 95%   BMI 31.31 kg/m   BP Readings from Last 3 Encounters:  10/04/23 120/62  06/07/23 110/60  03/08/23 128/72    Wt Readings from Last 3 Encounters:  10/04/23 212 lb (96.2 kg)  06/07/23 200 lb (90.7 kg)  03/08/23 204 lb (92.5 kg)    Physical Exam Constitutional:      General: She is not in acute distress.    Appearance: She is well-developed. She is obese.  HENT:     Head: Normocephalic.     Right Ear: External ear normal.     Left Ear: External ear normal.     Nose: Nose normal.  Eyes:     General:        Right eye: No discharge.        Left eye: No discharge.     Conjunctiva/sclera: Conjunctivae normal.     Pupils: Pupils are equal, round, and reactive to light.  Neck:     Thyroid: No thyromegaly.     Vascular: No JVD.     Trachea: No tracheal deviation.  Cardiovascular:     Rate and Rhythm: Normal rate and regular rhythm.     Heart sounds: Normal heart sounds.  Pulmonary:     Effort: No respiratory distress.     Breath sounds: No stridor. No wheezing.  Abdominal:     General: Bowel sounds are normal. There is no distension.     Palpations: Abdomen is soft. There is no mass.     Tenderness: There is no  abdominal tenderness. There is no guarding or rebound.  Musculoskeletal:        General: No tenderness.     Cervical back: Normal range of motion and neck supple. No rigidity.     Right lower leg: No edema.     Left lower leg: Edema present.  Lymphadenopathy:     Cervical:  No cervical adenopathy.  Skin:    Findings: No erythema or rash.  Neurological:     Mental Status: She is oriented to person, place, and time.     Cranial Nerves: No cranial nerve deficit.     Motor: No abnormal muscle tone.     Coordination: Coordination normal.     Deep Tendon Reflexes: Reflexes normal.  Psychiatric:        Behavior: Behavior normal.        Thought Content: Thought content normal.        Judgment: Judgment normal.   R foot in a boot  Lab Results  Component Value Date   WBC 8.1 01/25/2023   HGB 12.2 01/25/2023   HCT 37.4 01/25/2023   PLT 227 01/25/2023   GLUCOSE 256 (H) 01/25/2023   CHOL 215 (H) 02/23/2022   TRIG 301.0 (H) 02/23/2022   HDL 34.70 (L) 02/23/2022   LDLDIRECT 135.0 02/23/2022   LDLCALC 81 01/02/2013   ALT 16 01/22/2023   AST 17 01/22/2023   NA 132 (L) 01/25/2023   K 4.7 01/25/2023   CL 100 01/25/2023   CREATININE 1.13 (H) 01/25/2023   BUN 26 (H) 01/25/2023   CO2 26 01/25/2023   TSH 0.99 09/09/2021   INR 1.0 07/19/2019   HGBA1C 10.6 (H) 01/23/2023   MICROALBUR 0.9 07/19/2016    MR FOOT RIGHT W WO CONTRAST  Result Date: 01/23/2023 CLINICAL DATA:  Foot swelling, diabetic, osteomyelitis suspected, xray done lateral midfoot ulcer EXAM: MRI OF THE RIGHT FOREFOOT WITHOUT AND WITH CONTRAST TECHNIQUE: Multiplanar, multisequence MR imaging of the right forefoot was performed before and after the administration of intravenous contrast. CONTRAST:  8mL GADAVIST GADOBUTROL 1 MMOL/ML IV SOLN COMPARISON:  Multiple prior right foot radiographs, most recently 01/22/2023, MRI 03/11/2022 FINDINGS: Bones/Joint/Cartilage Postsurgical changes of prior fifth metatarsal base resection. There  are sclerotic changes within the residual base of fifth metatarsal with mild marrow edema and enhancement. There is also mild marrow edema and enhancement within the adjacent lateral aspect of the cuboid with chronic cortical erosion of the cuboid. Marrow signal abnormality is slightly worsened in comparison to prior MRI in May 2023. Ligaments Ankle ligaments are poorly evaluated due to scan obliquity. Muscles and Tendons Postsurgical changes of the peroneal brevis. Peroneal longus tendon appears intact. Soft tissues Lateral midfoot ulcer with adjacent skin thickening and underlying soft tissue swelling. There is no organized fluid collection. IMPRESSION: Lateral midfoot ulcer with postsurgical changes of prior base of fifth metatarsal resection. Chronic osseous changes in the residual base of fifth metatarsal and lateral cuboid, with underlying marrow edema and mild enhancement suggesting reactive marrow change or early acute on chronic osteomyelitis. No evidence of soft tissue abscess. Electronically Signed   By: Caprice Renshaw M.D.   On: 01/23/2023 15:48   DG Tibia/Fibula Right  Result Date: 01/22/2023 CLINICAL DATA:  Ulcer and infection EXAM: RIGHT TIBIA AND FIBULA - 2 VIEW COMPARISON:  None Available. FINDINGS: Diffuse bone demineralization. No evidence of acute fracture or dislocation. No focal bone lesion or bone erosion. Soft tissues are unremarkable. IMPRESSION: No acute bony abnormalities. Electronically Signed   By: Burman Nieves M.D.   On: 01/22/2023 22:47   DG Foot Complete Right  Result Date: 01/22/2023 CLINICAL DATA:  Ulcer and infection. EXAM: RIGHT FOOT COMPLETE - 3+ VIEW COMPARISON:  04/08/2022 FINDINGS: Diffuse bone demineralization. Previous resection or resorption of the distal right fifth metatarsal bone, unchanged. Bone erosion and sclerosis involving the base of the fifth metatarsal  in the adjacent cuboidal bone likely representing osteomyelitis. Appearance is similar to prior study.  No new areas of bone resorption or appreciated. Degenerative changes in the interphalangeal and intertarsal joints. No radiopaque soft tissue foreign bodies or soft tissue gas. IMPRESSION: Changes of chronic osteomyelitis suggested at the base of the fifth metatarsal and adjacent cuboidal bone similar to prior study. Soft tissue changes are improving since the prior study. Electronically Signed   By: Burman Nieves M.D.   On: 01/22/2023 22:46 pounds will be next year you need to renew any of your prescriptions   Assessment & Plan:   Problem List Items Addressed This Visit     Hypothyroidism    On Levothroid Dr Talmage Nap      B12 deficiency    On B12      Anxiety disorder    Chronic Occ panic attacks Cont on Clonazepam prn  Potential benefits of a long term benzodiazepines  use as well as potential risks  and complications were explained to the patient and were aknowledged.      LOW BACK PAIN - Primary    On Oxycodone 15 mg qid prn  Potential benefits of a long term opioids use as well as potential risks (i.e. addiction risk, apnea etc) and complications (i.e. Somnolence, constipation and others) were explained to the patient and were aknowledged. Worse Using a knee scooter R leg is in the boot      Relevant Medications   oxyCODONE (ROXICODONE) 15 MG immediate release tablet   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)    Pt is seeing Endo - Dr Talmage Nap Labs were done last week A1c was 7.7%         Meds ordered this encounter  Medications   oxyCODONE (ROXICODONE) 15 MG immediate release tablet    Sig: Take 1 tablet (15 mg total) by mouth every 6 (six) hours as needed for pain.    Dispense:  120 tablet    Refill:  0    Please fill on or after 10/04/23   clonazePAM (KLONOPIN) 0.5 MG tablet    Sig: Take 1 tablet (0.5 mg total) by mouth 2 (two) times daily as needed for anxiety.    Dispense:  60 tablet    Refill:  1      Follow-up: Return for a follow-up  visit.  Sonda Primes, MD

## 2023-10-05 ENCOUNTER — Ambulatory Visit: Payer: 59 | Admitting: Physical Therapy

## 2023-10-06 ENCOUNTER — Ambulatory Visit (INDEPENDENT_AMBULATORY_CARE_PROVIDER_SITE_OTHER): Payer: 59

## 2023-10-06 DIAGNOSIS — E11621 Type 2 diabetes mellitus with foot ulcer: Secondary | ICD-10-CM | POA: Diagnosis not present

## 2023-10-06 DIAGNOSIS — M2142 Flat foot [pes planus] (acquired), left foot: Secondary | ICD-10-CM

## 2023-10-06 DIAGNOSIS — M24571 Contracture, right ankle: Secondary | ICD-10-CM

## 2023-10-06 DIAGNOSIS — M2141 Flat foot [pes planus] (acquired), right foot: Secondary | ICD-10-CM

## 2023-10-06 DIAGNOSIS — M216X1 Other acquired deformities of right foot: Secondary | ICD-10-CM

## 2023-10-06 DIAGNOSIS — E0842 Diabetes mellitus due to underlying condition with diabetic polyneuropathy: Secondary | ICD-10-CM

## 2023-10-06 DIAGNOSIS — L97411 Non-pressure chronic ulcer of right heel and midfoot limited to breakdown of skin: Secondary | ICD-10-CM

## 2023-10-09 DIAGNOSIS — E1165 Type 2 diabetes mellitus with hyperglycemia: Secondary | ICD-10-CM | POA: Diagnosis not present

## 2023-10-10 NOTE — Progress Notes (Signed)

## 2023-10-18 ENCOUNTER — Ambulatory Visit: Payer: 59 | Admitting: Physical Therapy

## 2023-11-06 ENCOUNTER — Other Ambulatory Visit: Payer: Self-pay

## 2023-11-06 ENCOUNTER — Encounter: Payer: Self-pay | Admitting: Physical Therapy

## 2023-11-06 ENCOUNTER — Ambulatory Visit: Payer: 59 | Attending: Podiatry | Admitting: Physical Therapy

## 2023-11-06 DIAGNOSIS — M79605 Pain in left leg: Secondary | ICD-10-CM | POA: Insufficient documentation

## 2023-11-06 DIAGNOSIS — M25672 Stiffness of left ankle, not elsewhere classified: Secondary | ICD-10-CM | POA: Diagnosis not present

## 2023-11-06 NOTE — Therapy (Addendum)
 OUTPATIENT PHYSICAL THERAPY LOWER EXTREMITY EVALUATION   Patient Name: Katherine Herrera MRN: [id number] DOB:[birth year], 58 y.o., female Today's Date: 11/06/2023  END OF SESSION:  PT End of Session - 11/06/23 1428     Visit Number 1    Number of Visits 12    Date for PT Re-Evaluation 12/25/23    PT Start Time 0135    PT Stop Time 0209    PT Time Calculation (min) 34 min    Activity Tolerance Patient tolerated treatment well    Behavior During Therapy [facility] for tasks assessed/performed             Past Medical History:  Diagnosis Date   Anxiety    Arthritis    hands   B12 deficiency    Chronic constipation    Chronic pain syndrome    neck, lower back, and foot   COPD (chronic obstructive pulmonary disease) (HCC)    Diabetic ulcer of right foot (HCC)    Gout    possible   History of seizure    05-26-2020  per pt as child and last one in late teen's   History of sepsis    due to lower extremity cellulits 2019 left , right 03/ 2021   History of syncope    Hyperlipidemia    Hypothyroidism    Impaired range of motion of cervical spine    per pt hx cervical fusion C4 -- 7,  has to be careful about turning her head to the right can cause her to pass out   Insulin  dependent type 2 diabetes mellitus [facility])    endocrinologist--- dr tommas    (05-26-2020 per pt check's blood sugar twice dialy and at times a third time,  fasting am blood sugar's---- 200 -- 250)   LBP (low back pain)    DR GUST   Migraine    Peripheral neuropathy    RBBB (right bundle branch block)    Tenosynovitis of foot and ankle 09/03/2013   TTS (tarsal tunnel syndrome) 2010   Left Foot [place] in [place]   Vitamin D  deficiency    Wears glasses    Past Surgical History:  Procedure Laterality Date   ANTERIOR CERVICAL DECOMP/DISCECTOMY FUSION  02-12-2004;  07-06-2006   C4--5 in 2005;  C5 --7  in  2007   APPLICATION OF WOUND VAC Right 07/05/2019   Procedure: Application Of Wound Vac;  Surgeon: Gretel Ozell PARAS,  DPM;  Location: [facility];  Service: Podiatry;  Laterality: Right;   APPLICATION OF WOUND VAC Right 04/10/2022   Procedure: APPLICATION OF WOUND VAC, PLACEMENT OF ANTIBIOTIC BEADS;  Surgeon: Silva Juliene SAUNDERS, DPM;  Location: [facility];  Service: Podiatry;  Laterality: Right;   BONE BIOPSY Right 05/29/2020   Procedure: BONE BIOPSY;  Surgeon: Gretel Ozell PARAS, DPM;  Location: [facility];  Service: Podiatry;  Laterality: Right;   CARDIAC CATHETERIZATION  05-27-2002   dr ladona   for positive cardiolite;  normal coronaries and LVF, ef 70%   CARPAL TUNNEL RELEASE Bilateral left 12-25-2008;  right 09-22-2009  both @[facility]    HARDWARE REMOVAL  01-07-2016  @[facility]    right foot   INCISION AND DRAINAGE OF WOUND Right 07/20/2019   Procedure: IRRIGATION AND DEBRIDEMENT foot;  Surgeon: Gretel Ozell PARAS, DPM;  Location: [facility];  Service: Podiatry;  Laterality: Right;   INCISION AND DRAINAGE OF WOUND Right 07/22/2019   Procedure: IRRIGATION AND DEBRIDEMENT RIGHT FOOT; APPLICATION OF WOUND VAC;  Surgeon: Gretel Ozell PARAS,  DPM;  Location: [facility];  Service: Podiatry;  Laterality: Right;   INCISION AND DRAINAGE OF WOUND Right 07/24/2019   Procedure: IRRIGATION AND DEBRIDEMENT WOUND, APPLICATION OF WOUND VAC;  Surgeon: Gretel Ozell PARAS, DPM;  Location: [facility];  Service: Podiatry;  Laterality: Right;   INCISION AND DRAINAGE OF WOUND Right 05/29/2020   Procedure: RIGHT FOOT WOUND DEBRIDEMENT AND IRRIGATION;  Surgeon: Gretel Ozell PARAS, DPM;  Location: [facility] [place];  Service: Podiatry;  Laterality: Right;   INCISION AND DRAINAGE OF WOUND Right 04/13/2022   Procedure: IRRIGATION AND DEBRIDEMENT WOUND;  Surgeon: Tobie Franky SQUIBB, DPM;  Location: [facility];  Service: Podiatry;  Laterality: Right;   IRRIGATION AND DEBRIDEMENT FOOT Right 04/10/2022   Procedure: IRRIGATION AND DEBRIDEMENT FOOT;  Surgeon: Silva Juliene SAUNDERS, DPM;  Location: [facility];  Service: Podiatry;  Laterality: Right;   LUMBAR DISC SURGERY  07-07-2005   @[facility]     L5 - S1   METATARSAL HEAD EXCISION Bilateral 01/02/2019   Procedure: Left foot 5th metatarsal resection, wound excision, adjacent tissue transfer. Right fifth metatarsal head resection, fitfth metatarsal base resection, intermediate wound repair.;  Surgeon: Gretel Ozell PARAS, DPM;  Location: [facility];  Service: Podiatry;  Laterality: Bilateral;   METATARSAL HEAD EXCISION Right 05/12/2021   Procedure: Right 5th met base resection;  Surgeon: Gretel Ozell PARAS, DPM;  Location: [facility];  Service: Podiatry;  Laterality: Right;   METATARSAL OSTEOTOMY Right 07/05/2019   Procedure: RIGHT FOOT 5TH METATARSAL RESECTION; PERONEAL TENDONESIS, EXCISION OF ULCER, LOCAL TISSUE TRANSFER ;  Surgeon: Gretel Ozell PARAS, DPM;  Location: [facility];  Service: Podiatry;  Laterality: Right;   ORIF METATARSAL FRACTURE  09-22-2015  @[facility]    right 5th   TOTAL ABDOMINAL HYSTERECTOMY W/ BILATERAL SALPINGOOPHORECTOMY  2000 approx.   Patient Active Problem List   Diagnosis Date Noted   Cervical radiculopathy 03/08/2023   Cellulitis of right leg 01/23/2023   Osteomyelitis of foot (HCC) 04/08/2022   Finger pain, right 02/23/2022   Depression 02/23/2022   Thyroid  nodule 09/09/2021   Statin myopathy 08/06/2021   Essential hypertension 10/09/2020   Pure hypercholesterolemia 10/09/2020   Chronic osteomyelitis involving right ankle and foot (HCC)    Cellulitis of right lower leg 01/10/2020   Cellulitis 01/02/2020   Peroneal tendonitis, right    Right foot ulcer (HCC) 07/04/2019   Capsulitis of metatarsophalangeal (MTP) joint of right foot    Exostosis of bone of foot    Polyneuropathy due to type 2 diabetes mellitus (HCC) 10/01/2018   Chronic pain 09/29/2018   Cellulitis of left lower leg    Cerumen impaction 08/02/2017   Constipation due to pain medication 05/22/2017   AKI (acute kidney injury) (HCC) 05/03/2017   Charcot foot due to diabetes mellitus (HCC) 04/23/2017   Pain from implanted hardware 01/06/2016    Cellulitis and abscess of toe of right foot 07/24/2015   DOE (dyspnea on exertion) 06/16/2015   Chest pain, atypical 06/16/2015   Family history of musculoskeletal disease 03/12/2015   Non-compliant behavior 12/24/2014   Callus of foot 03/25/2014   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   Metatarsal deformity, left 09/03/2013   Pes cavus 10/13/2012   Acquired tight Achilles tendon 10/13/2012   Paronychia 06/14/2012   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) 03/19/2012   Allergic rhinitis 03/19/2012   TTS (tarsal tunnel syndrome)    Pain in joint 01/04/2010   CBC, ABNORMAL 05/01/2009   B12 deficiency 01/05/2009   GOUT 01/05/2009   Anxiety  disorder 07/28/2008   TOBACCO USE DISORDER/SMOKER-SMOKING CESSATION DISCUSSED 04/21/2008   FOOT PAIN 04/21/2008   Diabetic neuropathy (HCC) 08/27/2007   LOW BACK PAIN 08/27/2007   Dyslipidemia 08/20/2007   Hypothyroidism 05/22/2007   REFERRING PROVIDER: Franky Blanch  REFERRING DIAG: LE weakness.  THERAPY DIAG:  Pain in left leg  Stiffness of left ankle, not elsewhere classified  Rationale for Evaluation and Treatment: Rehabilitation  ONSET DATE: Ongoing.  SUBJECTIVE:   SUBJECTIVE STATEMENT: The patient presents to the clinic with a CC of right LE pain.  She states she has worn a CAM boot on/off for about 8 years due to multiple left foot surgeries.  Patient states she has diabetic shoes but they do not fit properly.  She is going to bring them to her visit to Dr. Blanch on 11/15/23.  She has been performing range of motion exercises for her right ankle.  She also walks around the house without the boot.  She has had a couple of falls over the last few months.  Recommended she use a cane which she has.  Her pain is reported in her right posterior thigh region.  She has no c/o LBP.  Standing and walking increase her pain.  She can only perform ADL's in short durations (~89 minutes).    PERTINENT HISTORY: DM, multiple left foot  surgeries, Decubitus ulcer (healed), DOS: 02/27/2023 Procedure: Right posterior tibial tendon lengthening Achilles tendon lengthening and tenodesis of the peroneal tendon PAIN:  Are you having pain? Yes: NPRS scale: 6/10. Pain location: Right ankle. Pain description: ache, sharp. Aggravating factors: Being off feet. Relieving factors: Standing and walking.  PRECAUTIONS: Other: Patient in right CAM boot but can be in shoes (ordered diabetic shoes that don't fit properly).   WEIGHT BEARING RESTRICTIONS:  Patient wbat with right CAM boot donned.  She walks some around her house without boot.  She has diabetic shoes that are not fitting properly.  FALLS:  Has patient fallen in last 6 months? Yes. Number of falls 2-3/10.  Recommended cane usage.  LIVING ENVIRONMENT: Lives in: House/apartment Has following equipment at home: None  OCCUPATION: Disabled.  PLOF: Independent with basic ADLs  PATIENT GOALS: Get rid of right posterior thigh pain that limits her ability to perform her ADL's.  OBJECTIVE:   POSTURE:  In supine:  Right ankle rests in inversion.  Patient states this is much better since surgery.  PALPATION: The patient's CC is pain over her right hamstring muscle group.  She had no specific areas of right ankle pain and no c/o low back, gluteal or SIJ pain.  LOWER EXTREMITY ROM:  Active right ankle dorsiflexion is 3 degrees, plantarflexion is 28 degrees and eversion is 2 degrees.  LOWER EXTREMITY MMT:  Pain with resisted right knee flexion and pain pain in hamstring group with active right knee extension.  GAIT: Antalgic gait with right CAM boot donned.  TUG is 30 seconds.  PATIENT EDUCATION:  Education details: Patient has been active right ankle range of motion exercises at home. Person educated: Patient Education method: Medical Illustrator Education comprehension: verbalized understanding and returned demonstration  HOME EXERCISE  PROGRAM:   ASSESSMENT:  CLINICAL IMPRESSION: The patient presents to [facility] with a CC of right  posterior thigh pain that prohibits her from performing her ADL's.  She also underwent left ankle surgery.  She continues to wear a CAM boot as her Diabetic shoes are not fitting properly.  She performs right ankle range of motion exercises at home.  She walks some without her boot at home.  Recommended she use a cane for additional safety.  She has a loss of active right ankle range of motion.  Her gait is antalgic in nature.  Her TUG was 30 seconds.  She c/o high pain-levels in her right hamstring muscle group wit certain movements and increased pain with walking and standing.  She can only perform her ADL's in short durations due to pain.  Patient will benefit from skilled physical therapy intervention to address pain and deficits.  OBJECTIVE IMPAIRMENTS: Abnormal gait, decreased activity tolerance, difficulty walking, decreased ROM, decreased strength, increased muscle spasms, and pain.   ACTIVITY LIMITATIONS: carrying, lifting, standing, stairs, and locomotion level  PARTICIPATION LIMITATIONS: meal prep, cleaning, laundry, and community activity  PERSONAL FACTORS: Time since onset of injury/illness/exacerbation and 1 comorbidity: multiple right foot/ankle surgeries and long-term use of right CAM boot.  are also affecting patient's functional outcome.   REHAB POTENTIAL: Good  CLINICAL DECISION MAKING: Evolving/moderate complexity  EVALUATION COMPLEXITY: Moderate   GOALS:  LONG TERM GOALS: Target date: 12/25/23.  Ind with a HEP. Goal status: INITIAL  2.  Right active dorsiflexion to 6 degrees.  Goal status: INITIAL  3.  Perform ADL's with right posterior thigh pain not > 3/10.  Goal status: INITIAL  4.  Walk with diabetic shoes in clinic 250 feet without antalgic.  Goal status: INITIAL  5.  Perform a reciprocating stair gait with one railing.  Goal status: INITIAL  PLAN:  PT  FREQUENCY: 2x/week  PT DURATION: 6 weeks  PLANNED INTERVENTIONS: 97110-Therapeutic exercises, 97530- Therapeutic activity, [id number]- Neuromuscular re-education, 97535- Self Care, [id number]- Manual therapy, [id number]- Electrical stimulation (unattended), 97035- Ultrasound, Patient/Family education, Balance training, Stair training, Cryotherapy, and Moist heat  PLAN FOR NEXT SESSION: Nustep, modalities and STW/M to right hamstrings.  Gait activities.  Seated Rockerboard and dynadisc.   Braxen Dobek, PT 11/06/2023, 3:30 PM

## 2023-11-07 ENCOUNTER — Other Ambulatory Visit: Payer: Self-pay | Admitting: Internal Medicine

## 2023-11-07 NOTE — Telephone Encounter (Signed)
 Copied from CRM [id number]. Topic: Clinical - Medication Refill >> Nov 07, 2023  4:16 PM Corin V wrote: Most Recent Primary Care Visit:  Provider: PLOTNIKOV, ALEKSEI V  Department: [facility]  Visit Type: OFFICE VISIT  Date: 10/04/2023  Medication: ***  Has the patient contacted their pharmacy?  (Agent: If no, request that the patient contact the pharmacy for the refill. If patient does not wish to contact the pharmacy document the reason why and proceed with request.) (Agent: If yes, when and what did the pharmacy advise?)  Is this the correct pharmacy for this prescription?  If no, delete pharmacy and type the correct one.  This is the patient's preferred pharmacy:  [id number] - [place], [place] - [address] [place] [address] Phone: [phone] Fax: [phone]  [facility] - [place], [place] - [address] [place] [address] Phone: [phone] Fax: [phone]   Has the prescription been filled recently?   Is the patient out of the medication?   Has the patient been seen for an appointment in the last year OR does the patient have an upcoming appointment?   Can we respond through MyChart?   Agent: Please be advised that Rx refills may take up to 3 business days. We ask that you follow-up with your pharmacy.

## 2023-11-07 NOTE — Telephone Encounter (Signed)
 Copied from CRM [id number]. Topic: Clinical - Medication Refill >> Nov 07, 2023  4:16 PM Corin V wrote: Most Recent Primary Care Visit:  Provider: PLOTNIKOV, ALEKSEI V  Department: [facility]  Visit Type: OFFICE VISIT  Date: 10/04/2023  Medication: baclofen  (LIORESAL ) 10 MG tablet  Has the patient contacted their pharmacy? Yes (Agent: If no, request that the patient contact the pharmacy for the refill. If patient does not wish to contact the pharmacy document the reason why and proceed with request.) (Agent: If yes, when and what did the pharmacy advise?)  Is this the correct pharmacy for this prescription? Yes If no, delete pharmacy and type the correct one.  This is the patient's preferred pharmacy:  [facility] - [place], [place] - [address] [place] [address] Phone: [phone] Fax: [phone]   Has the prescription been filled recently? No  Is the patient out of the medication? Yes  Has the patient been seen for an appointment in the last year OR does the patient have an upcoming appointment? Yes  Can we respond through MyChart? No  Agent: Please be advised that Rx refills may take up to 3 business days. We ask that you follow-up with your pharmacy.

## 2023-11-09 DIAGNOSIS — E1165 Type 2 diabetes mellitus with hyperglycemia: Secondary | ICD-10-CM | POA: Diagnosis not present

## 2023-11-14 ENCOUNTER — Ambulatory Visit: Payer: 59

## 2023-11-14 DIAGNOSIS — M25672 Stiffness of left ankle, not elsewhere classified: Secondary | ICD-10-CM | POA: Diagnosis not present

## 2023-11-14 DIAGNOSIS — M79605 Pain in left leg: Secondary | ICD-10-CM | POA: Diagnosis not present

## 2023-11-14 NOTE — Therapy (Signed)
 OUTPATIENT PHYSICAL THERAPY LOWER EXTREMITY TREATMENT   Patient Name: Katherine Herrera MRN: [id number] DOB:10-30-1966, 58 y.o., female Today's Date: 11/14/2023  END OF SESSION:  PT End of Session - 11/14/23 1435     Visit Number 2    Number of Visits 12    Date for PT Re-Evaluation 12/25/23    PT Start Time 1430    PT Stop Time 1520    PT Time Calculation (min) 50 min    Activity Tolerance Patient tolerated treatment well    Behavior During Therapy WFL for tasks assessed/performed             Past Medical History:  Diagnosis Date   Anxiety    Arthritis    hands   B12 deficiency    Chronic constipation    Chronic pain syndrome    neck, lower back, and foot   COPD (chronic obstructive pulmonary disease) (HCC)    Diabetic ulcer of right foot (HCC)    Gout    possible   History of seizure    05-26-2020  per pt as child and last one in late teen's   History of sepsis    due to lower extremity cellulits 2019 left , right 03/ 2021   History of syncope    Hyperlipidemia    Hypothyroidism    Impaired range of motion of cervical spine    per pt hx cervical fusion C4 -- 7,  has to be careful about turning her head to the right can cause her to pass out   Insulin  dependent type 2 diabetes mellitus [facility])    endocrinologist--- dr tommas    (05-26-2020 per pt check's blood sugar twice dialy and at times a third time,  fasting am blood sugar's---- 200 -- 250)   LBP (low back pain)    DR GUST   Migraine    Peripheral neuropathy    RBBB (right bundle branch block)    Tenosynovitis of foot and ankle 09/03/2013   TTS (tarsal tunnel syndrome) 2010   Left Foot [facility] in [place]   Vitamin D  deficiency    Wears glasses    Past Surgical History:  Procedure Laterality Date   ANTERIOR CERVICAL DECOMP/DISCECTOMY FUSION  02-12-2004;  07-06-2006   C4--5 in 2005;  C5 --7  in  2007   APPLICATION OF WOUND VAC Right 07/05/2019   Procedure: Application Of Wound Vac;  Surgeon: Gretel Ozell PARAS,  DPM;  Location: [facility];  Service: Podiatry;  Laterality: Right;   APPLICATION OF WOUND VAC Right 04/10/2022   Procedure: APPLICATION OF WOUND VAC, PLACEMENT OF ANTIBIOTIC BEADS;  Surgeon: Silva Juliene SAUNDERS, DPM;  Location: [facility];  Service: Podiatry;  Laterality: Right;   BONE BIOPSY Right 05/29/2020   Procedure: BONE BIOPSY;  Surgeon: Gretel Ozell PARAS, DPM;  Location: [facility];  Service: Podiatry;  Laterality: Right;   CARDIAC CATHETERIZATION  05-27-2002   dr ladona   for positive cardiolite;  normal coronaries and LVF, ef 70%   CARPAL TUNNEL RELEASE Bilateral left 12-25-2008;  right 09-22-2009  both @[facility]    HARDWARE REMOVAL  01-07-2016  @[facility]    right foot   INCISION AND DRAINAGE OF WOUND Right 07/20/2019   Procedure: IRRIGATION AND DEBRIDEMENT foot;  Surgeon: Gretel Ozell PARAS, DPM;  Location: [facility];  Service: Podiatry;  Laterality: Right;   INCISION AND DRAINAGE OF WOUND Right 07/22/2019   Procedure: IRRIGATION AND DEBRIDEMENT RIGHT FOOT; APPLICATION OF WOUND VAC;  Surgeon: Gretel Ozell PARAS,  DPM;  Location: [facility];  Service: Podiatry;  Laterality: Right;   INCISION AND DRAINAGE OF WOUND Right 07/24/2019   Procedure: IRRIGATION AND DEBRIDEMENT WOUND, APPLICATION OF WOUND VAC;  Surgeon: Gretel Ozell PARAS, DPM;  Location: [facility];  Service: Podiatry;  Laterality: Right;   INCISION AND DRAINAGE OF WOUND Right 05/29/2020   Procedure: RIGHT FOOT WOUND DEBRIDEMENT AND IRRIGATION;  Surgeon: Gretel Ozell PARAS, DPM;  Location: [facility];  Service: Podiatry;  Laterality: Right;   INCISION AND DRAINAGE OF WOUND Right 04/13/2022   Procedure: IRRIGATION AND DEBRIDEMENT WOUND;  Surgeon: Tobie Franky SQUIBB, DPM;  Location: [facility];  Service: Podiatry;  Laterality: Right;   IRRIGATION AND DEBRIDEMENT FOOT Right 04/10/2022   Procedure: IRRIGATION AND DEBRIDEMENT FOOT;  Surgeon: Silva Juliene SAUNDERS, DPM;  Location: [facility];  Service: Podiatry;  Laterality: Right;   LUMBAR DISC SURGERY  07-07-2005   @[facility]     L5 - S1   METATARSAL HEAD EXCISION Bilateral 01/02/2019   Procedure: Left foot 5th metatarsal resection, wound excision, adjacent tissue transfer. Right fifth metatarsal head resection, fitfth metatarsal base resection, intermediate wound repair.;  Surgeon: Gretel Ozell PARAS, DPM;  Location: [facility];  Service: Podiatry;  Laterality: Bilateral;   METATARSAL HEAD EXCISION Right 05/12/2021   Procedure: Right 5th met base resection;  Surgeon: Gretel Ozell PARAS, DPM;  Location: [facility];  Service: Podiatry;  Laterality: Right;   METATARSAL OSTEOTOMY Right 07/05/2019   Procedure: RIGHT FOOT 5TH METATARSAL RESECTION; PERONEAL TENDONESIS, EXCISION OF ULCER, LOCAL TISSUE TRANSFER ;  Surgeon: Gretel Ozell PARAS, DPM;  Location: [facility];  Service: Podiatry;  Laterality: Right;   ORIF METATARSAL FRACTURE  09-22-2015  @[facility]    right 5th   TOTAL ABDOMINAL HYSTERECTOMY W/ BILATERAL SALPINGOOPHORECTOMY  2000 approx.   Patient Active Problem List   Diagnosis Date Noted   Cervical radiculopathy 03/08/2023   Cellulitis of right leg 01/23/2023   Osteomyelitis of foot (HCC) 04/08/2022   Finger pain, right 02/23/2022   Depression 02/23/2022   Thyroid  nodule 09/09/2021   Statin myopathy 08/06/2021   Essential hypertension 10/09/2020   Pure hypercholesterolemia 10/09/2020   Chronic osteomyelitis involving right ankle and foot (HCC)    Cellulitis of right lower leg 01/10/2020   Cellulitis 01/02/2020   Peroneal tendonitis, right    Right foot ulcer (HCC) 07/04/2019   Capsulitis of metatarsophalangeal (MTP) joint of right foot    Exostosis of bone of foot    Polyneuropathy due to type 2 diabetes mellitus (HCC) 10/01/2018   Chronic pain 09/29/2018   Cellulitis of left lower leg    Cerumen impaction 08/02/2017   Constipation due to pain medication 05/22/2017   AKI (acute kidney injury) (HCC) 05/03/2017   Charcot foot due to diabetes mellitus (HCC) 04/23/2017   Pain from implanted hardware 01/06/2016    Cellulitis and abscess of toe of right foot 07/24/2015   DOE (dyspnea on exertion) 06/16/2015   Chest pain, atypical 06/16/2015   Family history of musculoskeletal disease 03/12/2015   Non-compliant behavior 12/24/2014   Callus of foot 03/25/2014   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   Metatarsal deformity, left 09/03/2013   Pes cavus 10/13/2012   Acquired tight Achilles tendon 10/13/2012   Paronychia 06/14/2012   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) 03/19/2012   Allergic rhinitis 03/19/2012   TTS (tarsal tunnel syndrome)    Pain in joint 01/04/2010   CBC, ABNORMAL 05/01/2009   B12 deficiency 01/05/2009   GOUT 01/05/2009   Anxiety  disorder 07/28/2008   TOBACCO USE DISORDER/SMOKER-SMOKING CESSATION DISCUSSED 04/21/2008   FOOT PAIN 04/21/2008   Diabetic neuropathy (HCC) 08/27/2007   LOW BACK PAIN 08/27/2007   Dyslipidemia 08/20/2007   Hypothyroidism 05/22/2007   REFERRING PROVIDER: Franky Blanch  REFERRING DIAG: LE weakness.  THERAPY DIAG:  Pain in left leg  Stiffness of left ankle, not elsewhere classified  Rationale for Evaluation and Treatment: Rehabilitation  ONSET DATE: Ongoing.  SUBJECTIVE:   SUBJECTIVE STATEMENT: Pt reports 7/10 right hip and hamstring pain.     PERTINENT HISTORY: DM, multiple left foot surgeries, Decubitus ulcer (healed), DOS: 02/27/2023 Procedure: Right posterior tibial tendon lengthening Achilles tendon lengthening and tenodesis of the peroneal tendon PAIN:  Are you having pain? Yes: NPRS scale: 7/10. Pain location: Right ankle. Pain description: ache, sharp. Aggravating factors: Being off feet. Relieving factors: Standing and walking.  PRECAUTIONS: Other: Patient in right CAM boot but can be in shoes (ordered diabetic shoes that don't fit properly).   WEIGHT BEARING RESTRICTIONS:  Patient wbat with right CAM boot donned.  She walks some around her house without boot.  She has diabetic shoes that are not fitting  properly.  FALLS:  Has patient fallen in last 6 months? Yes. Number of falls 2-3/10.  Recommended cane usage.  LIVING ENVIRONMENT: Lives in: House/apartment Has following equipment at home: None  OCCUPATION: Disabled.  PLOF: Independent with basic ADLs  PATIENT GOALS: Get rid of right posterior thigh pain that limits her ability to perform her ADL's.  OBJECTIVE:   POSTURE:  In supine:  Left ankle rests in inversion.  Patient states this is much better since surgery.  PALPATION: The patient's CC is pain over her right hamstring muscle group.  She had no specific areas of right ankle pain and no c/o low back, gluteal or SIJ pain.  LOWER EXTREMITY ROM:  Active right ankle dorsiflexion is 3 degrees, plantarflexion is 28 degrees and eversion is 2 degrees.  LOWER EXTREMITY MMT:  Pain with resisted right knee flexion and pain pain in hamstring group with active right knee extension.  GAIT: Antalgic gait with right CAM boot donned.  TUG is 30 seconds.  TODAY'S TREATMENT SESSION:  11/14/23   Manual Therapy Soft Tissue Mobilization: right  IT band and hamstring, STW/M to right IT and hamstring to decrease pain and tone, pt positioned in right side-lying for comfort with pillow between her knees    Modalities  Date:  Unattended Estim: Hip, IFC 80-150 hz, 15 mins, Pain and Tone Hot Pack: Hip, 15 mins, Pain   PATIENT EDUCATION:  Education details: Patient has been active right ankle range of motion exercises at home. Person educated: Patient Education method: Medical Illustrator Education comprehension: verbalized understanding and returned demonstration  HOME EXERCISE PROGRAM:   ASSESSMENT:  CLINICAL IMPRESSION: Pt arrives for today's treatment session reporting 7/10 right hip and hamstring pain.  Pt with CAM boot donned upon arrival for treatment.  Pt denied Nustep today due to pain.  STW/M performed to right IT band and hamstring to decrease pain and tone.   Normal responses to estim and MH noted upon removal.  Pt denied any change in pain at completion of today's treatment session.   OBJECTIVE IMPAIRMENTS: Abnormal gait, decreased activity tolerance, difficulty walking, decreased ROM, decreased strength, increased muscle spasms, and pain.   ACTIVITY LIMITATIONS: carrying, lifting, standing, stairs, and locomotion level  PARTICIPATION LIMITATIONS: meal prep, cleaning, laundry, and community activity  PERSONAL FACTORS: Time since onset of injury/illness/exacerbation and 1 comorbidity:  multiple right foot/ankle surgeries and long-term use of right CAM boot.  are also affecting patient's functional outcome.   REHAB POTENTIAL: Good  CLINICAL DECISION MAKING: Evolving/moderate complexity  EVALUATION COMPLEXITY: Moderate   GOALS:  LONG TERM GOALS: Target date: 12/25/23.  Ind with a HEP. Goal status: INITIAL  2.  Right active dorsiflexion to 6 degrees.  Goal status: INITIAL  3.  Perform ADL's with right posterior thigh pain not > 3/10.  Goal status: INITIAL  4.  Walk with diabetic shoes in clinic 250 feet without antalgic.  Goal status: INITIAL  5.  Perform a reciprocating stair gait with one railing.  Goal status: INITIAL  PLAN:  PT FREQUENCY: 2x/week  PT DURATION: 6 weeks  PLANNED INTERVENTIONS: 97110-Therapeutic exercises, 97530- Therapeutic activity, [id number]- Neuromuscular re-education, 97535- Self Care, [id number]- Manual therapy, [id number]- Electrical stimulation (unattended), 97035- Ultrasound, Patient/Family education, Balance training, Stair training, Cryotherapy, and Moist heat  PLAN FOR NEXT SESSION: Nustep, modalities and STW/M to right hamstrings.  Gait activities.  Seated Rockerboard and dynadisc.   Delon DELENA Gosling, PTA 11/14/2023, 3:39 PM

## 2023-11-15 ENCOUNTER — Ambulatory Visit (INDEPENDENT_AMBULATORY_CARE_PROVIDER_SITE_OTHER): Payer: 59 | Admitting: Podiatry

## 2023-11-15 ENCOUNTER — Encounter: Payer: Self-pay | Admitting: Podiatry

## 2023-11-15 DIAGNOSIS — L97411 Non-pressure chronic ulcer of right heel and midfoot limited to breakdown of skin: Secondary | ICD-10-CM

## 2023-11-15 DIAGNOSIS — M2142 Flat foot [pes planus] (acquired), left foot: Secondary | ICD-10-CM | POA: Diagnosis not present

## 2023-11-15 DIAGNOSIS — M2141 Flat foot [pes planus] (acquired), right foot: Secondary | ICD-10-CM

## 2023-11-15 DIAGNOSIS — M778 Other enthesopathies, not elsewhere classified: Secondary | ICD-10-CM | POA: Diagnosis not present

## 2023-11-15 DIAGNOSIS — E11621 Type 2 diabetes mellitus with foot ulcer: Secondary | ICD-10-CM

## 2023-11-15 NOTE — Progress Notes (Signed)
Subjective:  Patient ID: Katherine Herrera, female    DOB: [birth year],  MRN: [id number]  Chief Complaint  Patient presents with   Foot Pain    RM#13 Follow up on right foot patient states still using boot but now dealing with pulled hamstring. Brought in her diabetic shoes to discussed and nail trim.    DOS: 02/27/2023 Procedure: Right posterior tibial tendon lengthening Achilles tendon lengthening and tenodesis of the peroneal tendon  58 y.o. female returns for post-op check.  Patient states she is doing okay.  No nausea fever chills vomiting.  She states outside part of the foot hurts a little bit.  Review of Systems: Negative except as noted in the HPI. Denies N/V/F/Ch.  Past Medical History:  Diagnosis Date   Anxiety    Arthritis    hands   B12 deficiency    Chronic constipation    Chronic pain syndrome    neck, lower back, and foot   COPD (chronic obstructive pulmonary disease) (HCC)    Diabetic ulcer of right foot (HCC)    Gout    possible   History of seizure    05-26-2020  per pt as child and last one in late teen's   History of sepsis    due to lower extremity cellulits 2019 left , right 03/ 2021   History of syncope    Hyperlipidemia    Hypothyroidism    Impaired range of motion of cervical spine    per pt hx cervical fusion C4 -- 7,  has to be careful about turning her head to the right can cause her to pass out   Insulin dependent type 2 diabetes mellitus [facility])    endocrinologist--- dr Talmage Nap    (05-26-2020 per pt check's blood sugar twice dialy and at times a third time,  fasting am blood sugar's---- 200 -- 250)   LBP (low back pain)    DR Noel Gerold   Migraine    Peripheral neuropathy    RBBB (right bundle branch block)    Tenosynovitis of foot and ankle 09/03/2013   TTS (tarsal tunnel syndrome) 2010   Left Foot [facility] in [place]   Vitamin D deficiency    Wears glasses     Current Outpatient Medications:    albuterol (VENTOLIN HFA) 108 (90 Base) MCG/ACT inhaler,  Inhale 1 puff into the lungs every 6 (six) hours as needed for shortness of breath., Disp: , Rfl:    Alcohol Swabs (PHARMACIST CHOICE ALCOHOL) PADS, , Disp: , Rfl:    aspirin 325 MG tablet, Take 325 mg by mouth daily., Disp: , Rfl:    B-D INS SYR ULTRAFINE 1CC/31G 31G X 5/16" 1 ML MISC, USE AS DIRECTED, Disp: 100 each, Rfl: 0   baclofen (LIORESAL) 10 MG tablet, TAKE 2 TABLETS BY MOUTH TWICE  DAILY, Disp: 180 tablet, Rfl: 1   bisacodyl (DULCOLAX) 5 MG EC tablet, Take 10 mg by mouth at bedtime., Disp: , Rfl:    Blood Glucose Monitoring Suppl (ONETOUCH VERIO) w/Device KIT, , Disp: , Rfl:    celecoxib (CELEBREX) 200 MG capsule, Take 1 capsule (200 mg total) by mouth 2 (two) times daily as needed. TAKE 1 CAPSULE TWICE DAILY AS NEEDED FOR PAIN WITH FOOD, Disp: 60 capsule, Rfl: 0   cholecalciferol (VITAMIN D3) 25 MCG (1000 UNIT) tablet, Take 1,000 Units by mouth daily., Disp: , Rfl:    clonazePAM (KLONOPIN) 0.5 MG tablet, Take 1 tablet (0.5 mg total) by mouth 2 (two) times daily as  needed for anxiety., Disp: 60 tablet, Rfl: 1   collagenase (SANTYL) 250 UNIT/GM ointment, Apply 1 Application topically daily., Disp: 15 g, Rfl: 0   Continuous Blood Gluc Sensor (FREESTYLE LIBRE 2 SENSOR) MISC, as directed subcutaneous every 14 days, Disp: , Rfl:    docusate sodium (COLACE) 100 MG capsule, Take 100 mg by mouth at bedtime., Disp: , Rfl:    doxycycline (VIBRA-TABS) 100 MG tablet, Take 1 tablet (100 mg total) by mouth 2 (two) times daily., Disp: 60 tablet, Rfl: 0   ezetimibe (ZETIA) 10 MG tablet, TAKE 1 TABLET BY MOUTH DAILY, Disp: 100 tablet, Rfl: 2   FLUoxetine (PROZAC) 10 MG capsule, TAKE 1 CAPSULE BY MOUTH EVERY DAY (Patient taking differently: Take 10 mg by mouth daily.), Disp: 90 capsule, Rfl: 2   gabapentin (NEURONTIN) 800 MG tablet, TAKE 1 TABLET BY MOUTH 4 TIMES  DAILY, Disp: 400 tablet, Rfl: 2   ibuprofen (ADVIL) 800 MG tablet, Take 1 tablet (800 mg total) by mouth every 6 (six) hours as needed., Disp: 60  tablet, Rfl: 1   insulin NPH-regular Human (70-30) 100 UNIT/ML injection, Inject 20 Units into the skin 2 (two) times daily with a meal. (Patient taking differently: Inject 30-50 Units into the skin See admin instructions. Inject 30 units SQ in the morning and afternoon then inject 50 units SQ at night), Disp: 10 mL, Rfl: 11   lamoTRIgine (LAMICTAL) 25 MG tablet, TAKE 1 TABLET BY MOUTH TWICE  DAILY, Disp: 200 tablet, Rfl: 2   Lancet Devices (ACCU-CHEK SOFTCLIX) lancets, Test two times daily, Disp: 100 each, Rfl: 12   Lancets (ONETOUCH DELICA PLUS LANCET33G) MISC, , Disp: , Rfl:    levothyroxine (SYNTHROID) 75 MCG tablet, TAKE 1 TABLET BY MOUTH DAILY  BEFORE BREAKFAST, Disp: 100 tablet, Rfl: 2   lisinopril (ZESTRIL) 10 MG tablet, TAKE 1 TABLET BY MOUTH DAILY, Disp: 90 tablet, Rfl: 3   methylPREDNISolone (MEDROL DOSEPAK) 4 MG TBPK tablet, As directed, Disp: 21 tablet, Rfl: 0   ondansetron (ZOFRAN) 4 MG tablet, Take 4 mg by mouth every 8 (eight) hours as needed for nausea or vomiting., Disp: , Rfl:    ONETOUCH VERIO test strip, 1 each 3 (three) times daily., Disp: , Rfl:    oxyCODONE (ROXICODONE) 15 MG immediate release tablet, Take 1 tablet (15 mg total) by mouth every 6 (six) hours as needed for pain., Disp: 120 tablet, Rfl: 0   rOPINIRole (REQUIP) 0.5 MG tablet, TAKE 1 TABLET BY MOUTH AT  BEDTIME, Disp: 100 tablet, Rfl: 2   sodium hypochlorite (DAKIN'S 1/2 STRENGTH) external solution, Irrigate with 1 Application as directed daily., Disp: 437 mL, Rfl: 0   vitamin B-12 (CYANOCOBALAMIN) 1000 MCG tablet, Take 1,000 mcg by mouth daily., Disp: , Rfl:   Social History   Tobacco Use  Smoking Status Every Day   Current packs/day: 1.00   Average packs/day: 1 pack/day for 10.0 years (10.0 ttl pk-yrs)   Types: Cigarettes  Smokeless Tobacco Never  Tobacco Comments   Trying to quit, smoking 1 ppd    Allergies  Allergen Reactions   Cephalexin Nausea And Vomiting   Demerol  [Meperidine Hcl] Rash    Lovastatin Other (See Comments)    Possible myalgia    Metformin And Related Nausea And Vomiting   Sulfamethoxazole-Trimethoprim     Other reaction(s): Unknown DILI, pancreatitis   Crestor [Rosuvastatin Calcium]     myalgia   Hydromorphone     Other reaction(s): Unknown   Niacin Other (See Comments)  Unknown    Other Other (See Comments)   Hydromorphone Hcl Itching    Patient has been tolerating Hydromorphone tablets without adverse effect (07/09/19) Other reaction(s): Unknown   Paroxetine Other (See Comments)    Unknown    Objective:  There were no vitals filed for this visit. There is no height or weight on file to calculate BMI. Constitutional Well developed. Well nourished.  Vascular Foot warm and well perfused. Capillary refill normal to all digits.   Neurologic Normal speech. Oriented to person, place, and time. Epicritic sensation to light touch grossly present bilaterally.  Dermatologic Skin healing well without signs of infection. Skin edges well coapted without signs of infection.  No further dehiscence noted.  No complication noted she has return to regular activities  Orthopedic: Mild tenderness to palpation noted about the surgical site.   Radiographs: None Assessment:   1. Diabetic ulcer of right midfoot associated with type 2 diabetes mellitus, limited to breakdown of skin (HCC)   2. Pes planus of both feet      Plan:  Patient was evaluated and treated and all questions answered.  S/p foot surgery right with underlying foot deformity -Wound clinically healed and completely epithelialized.  At this time patient does not have any restrictions diabetic shoes were dispensed and they are functioning well.  I discussed with her that if any foot and ankle issues arise in the future she will come back and see me.

## 2023-11-16 ENCOUNTER — Ambulatory Visit: Payer: 59 | Admitting: Physical Therapy

## 2023-11-16 DIAGNOSIS — M25672 Stiffness of left ankle, not elsewhere classified: Secondary | ICD-10-CM

## 2023-11-16 DIAGNOSIS — M79605 Pain in left leg: Secondary | ICD-10-CM

## 2023-11-16 NOTE — Therapy (Signed)
OUTPATIENT PHYSICAL THERAPY LOWER EXTREMITY TREATMENT   Patient Name: Katherine Herrera MRN: [id number] DOB:Oct 19, 1966, 58 y.o., female Today's Date: 11/16/2023  END OF SESSION:  PT End of Session - 11/16/23 1556     Visit Number 3    Number of Visits 12    Date for PT Re-Evaluation 12/25/23    PT Start Time 0234    PT Stop Time 0325    PT Time Calculation (min) 51 min    Activity Tolerance Patient tolerated treatment well    Behavior During Therapy [facility] for tasks assessed/performed             Past Medical History:  Diagnosis Date   Anxiety    Arthritis    hands   B12 deficiency    Chronic constipation    Chronic pain syndrome    neck, lower back, and foot   COPD (chronic obstructive pulmonary disease) (HCC)    Diabetic ulcer of right foot (HCC)    Gout    possible   History of seizure    05-26-2020  per pt as child and last one in late teen's   History of sepsis    due to lower extremity cellulits 2019 left , right 03/ 2021   History of syncope    Hyperlipidemia    Hypothyroidism    Impaired range of motion of cervical spine    per pt hx cervical fusion C4 -- 7,  has to be careful about turning her head to the right can cause her to pass out   Insulin dependent type 2 diabetes mellitus [facility])    endocrinologist--- dr Talmage Nap    (05-26-2020 per pt check's blood sugar twice dialy and at times a third time,  fasting am blood sugar's---- 200 -- 250)   LBP (low back pain)    DR Noel Gerold   Migraine    Peripheral neuropathy    RBBB (right bundle branch block)    Tenosynovitis of foot and ankle 09/03/2013   TTS (tarsal tunnel syndrome) 2010   Left Foot [facility] in [place]   Vitamin D deficiency    Wears glasses    Past Surgical History:  Procedure Laterality Date   ANTERIOR CERVICAL DECOMP/DISCECTOMY FUSION  02-12-2004;  07-06-2006   C4--5 in 2005;  C5 --7  in  2007   APPLICATION OF WOUND VAC Right 07/05/2019   Procedure: Application Of Wound Vac;  Surgeon: Park Liter,  DPM;  [facility];  Service: Podiatry;  Laterality: Right;   APPLICATION OF WOUND VAC Right 04/10/2022   Procedure: APPLICATION OF WOUND VAC, PLACEMENT OF ANTIBIOTIC BEADS;  Surgeon: Edwin Cap, DPM;  [facility];  Service: Podiatry;  Laterality: Right;   BONE BIOPSY Right 05/29/2020   Procedure: BONE BIOPSY;  Surgeon: Park Liter, DPM;  Location: [facility] [place];  Service: Podiatry;  Laterality: Right;   CARDIAC CATHETERIZATION  05-27-2002   dr Jacinto Halim   for positive cardiolite;  normal coronaries and LVF, ef 70%   CARPAL TUNNEL RELEASE Bilateral left 12-25-2008;  right 09-22-2009  both @[facility]    HARDWARE REMOVAL  01-07-2016  @[facility]    right foot   INCISION AND DRAINAGE OF WOUND Right 07/20/2019   Procedure: IRRIGATION AND DEBRIDEMENT foot;  Surgeon: Park Liter, DPM;  [facility];  Service: Podiatry;  Laterality: Right;   INCISION AND DRAINAGE OF WOUND Right 07/22/2019   Procedure: IRRIGATION AND DEBRIDEMENT RIGHT FOOT; APPLICATION OF WOUND VAC;  Surgeon: Park Liter,  DPM;  [facility];  Service: Podiatry;  Laterality: Right;   INCISION AND DRAINAGE OF WOUND Right 07/24/2019   Procedure: IRRIGATION AND DEBRIDEMENT WOUND, APPLICATION OF WOUND VAC;  Surgeon: Park Liter, DPM;  [facility];  Service: Podiatry;  Laterality: Right;   INCISION AND DRAINAGE OF WOUND Right 05/29/2020   Procedure: RIGHT FOOT WOUND DEBRIDEMENT AND IRRIGATION;  Surgeon: Park Liter, DPM;  Location: [facility] [place];  Service: Podiatry;  Laterality: Right;   INCISION AND DRAINAGE OF WOUND Right 04/13/2022   Procedure: IRRIGATION AND DEBRIDEMENT WOUND;  Surgeon: Candelaria Stagers, DPM;  [facility];  Service: Podiatry;  Laterality: Right;   IRRIGATION AND DEBRIDEMENT FOOT Right 04/10/2022   Procedure: IRRIGATION AND DEBRIDEMENT FOOT;  Surgeon: Edwin Cap, DPM;  [facility];  Service: Podiatry;  Laterality: Right;   LUMBAR DISC SURGERY  07-07-2005   @[facility]     L5 - S1   METATARSAL HEAD EXCISION Bilateral 01/02/2019   Procedure: Left foot 5th metatarsal resection, wound excision, adjacent tissue transfer. Right fifth metatarsal head resection, fitfth metatarsal base resection, intermediate wound repair.;  Surgeon: Park Liter, DPM;  Location: [facility] ;  Service: Podiatry;  Laterality: Bilateral;   METATARSAL HEAD EXCISION Right 05/12/2021   Procedure: Right 5th met base resection;  Surgeon: Park Liter, DPM;  [facility];  Service: Podiatry;  Laterality: Right;   METATARSAL OSTEOTOMY Right 07/05/2019   Procedure: RIGHT FOOT 5TH METATARSAL RESECTION; PERONEAL TENDONESIS, EXCISION OF ULCER, LOCAL TISSUE TRANSFER ;  Surgeon: Park Liter, DPM;  [facility];  Service: Podiatry;  Laterality: Right;   ORIF METATARSAL FRACTURE  09-22-2015  @[facility]    right 5th   TOTAL ABDOMINAL HYSTERECTOMY W/ BILATERAL SALPINGOOPHORECTOMY  2000 approx.   Patient Active Problem List   Diagnosis Date Noted   Cervical radiculopathy 03/08/2023   Cellulitis of right leg 01/23/2023   Osteomyelitis of foot (HCC) 04/08/2022   Finger pain, right 02/23/2022   Depression 02/23/2022   Thyroid nodule 09/09/2021   Statin myopathy 08/06/2021   Essential hypertension 10/09/2020   Pure hypercholesterolemia 10/09/2020   Chronic osteomyelitis involving right ankle and foot (HCC)    Cellulitis of right lower leg 01/10/2020   Cellulitis 01/02/2020   Peroneal tendonitis, right    Right foot ulcer (HCC) 07/04/2019   Capsulitis of metatarsophalangeal (MTP) joint of right foot    Exostosis of bone of foot    Polyneuropathy due to type 2 diabetes mellitus (HCC) 10/01/2018   Chronic pain 09/29/2018   Cellulitis of left lower leg    Cerumen impaction 08/02/2017   Constipation due to pain medication 05/22/2017   AKI (acute kidney injury) (HCC) 05/03/2017   Charcot foot due to diabetes mellitus (HCC) 04/23/2017   Pain from implanted hardware 01/06/2016    Cellulitis and abscess of toe of right foot 07/24/2015   DOE (dyspnea on exertion) 06/16/2015   Chest pain, atypical 06/16/2015   Family history of musculoskeletal disease 03/12/2015   Non-compliant behavior 12/24/2014   Callus of foot 03/25/2014   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   Metatarsal deformity, left 09/03/2013   Pes cavus 10/13/2012   Acquired tight Achilles tendon 10/13/2012   Paronychia 06/14/2012   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) 03/19/2012   Allergic rhinitis 03/19/2012   TTS (tarsal tunnel syndrome)    Pain in joint 01/04/2010   CBC, ABNORMAL 05/01/2009   B12 deficiency 01/05/2009   GOUT 01/05/2009   Anxiety  disorder 07/28/2008   TOBACCO USE DISORDER/SMOKER-SMOKING CESSATION DISCUSSED 04/21/2008   FOOT PAIN 04/21/2008   Diabetic neuropathy (HCC) 08/27/2007   LOW BACK PAIN 08/27/2007   Dyslipidemia 08/20/2007   Hypothyroidism 05/22/2007   REFERRING PROVIDER: Nicholes Rough  REFERRING DIAG: LE weakness.  THERAPY DIAG:  Pain in left leg  Stiffness of left ankle, not elsewhere classified  Rationale for Evaluation and Treatment: Rehabilitation  ONSET DATE: Ongoing.  SUBJECTIVE:   SUBJECTIVE STATEMENT: Pain about a 6.  Patient in bilateral surgical shoes today.  PERTINENT HISTORY: DM, multiple left foot surgeries, Decubitus ulcer (healed), DOS: 02/27/2023 Procedure: Right posterior tibial tendon lengthening Achilles tendon lengthening and tenodesis of the peroneal tendon PAIN:  Are you having pain? Yes: NPRS scale: 6/10. Pain location: Right ankle. Pain description: ache, sharp. Aggravating factors: Being off feet. Relieving factors: Standing and walking.  PRECAUTIONS: Other: Patient in right CAM boot but can be in shoes (ordered diabetic shoes that don't fit properly).   WEIGHT BEARING RESTRICTIONS:  Patient wbat with right CAM boot donned.  She walks some around her house without boot.  She has diabetic shoes that are not fitting  properly.  FALLS:  Has patient fallen in last 6 months? Yes. Number of falls 2-3/10.  Recommended cane usage.  LIVING ENVIRONMENT: Lives in: House/apartment Has following equipment at home: None  OCCUPATION: Disabled.  PLOF: Independent with basic ADLs  PATIENT GOALS: Get rid of right posterior thigh pain that limits her ability to perform her ADL's.  OBJECTIVE:   POSTURE:  In supine:  Left ankle rests in inversion.  Patient states this is much better since surgery.  PALPATION: The patient's CC is pain over her right hamstring muscle group.  She had no specific areas of right ankle pain and no c/o low back, gluteal or SIJ pain.  LOWER EXTREMITY ROM:  Active right ankle dorsiflexion is 3 degrees, plantarflexion is 28 degrees and eversion is 2 degrees.  LOWER EXTREMITY MMT:  Pain with resisted right knee flexion and pain pain in hamstring group with active right knee extension.  GAIT: Antalgic gait with right CAM boot donned.  TUG is 30 seconds.  TODAY'S TREATMENT SESSION:  11/16/23:  Nustep level 2 x 10 minutes f/b STW/M x 13 minutes to patient's right hamstring and ITB with patient in the left sdly position with folded pillow between knees for comfort f/b HMP and HMP and IFC at 80-150 Hz on 40% scan x 20 minutes.  Normal modality response following removal of modality.   11/14/23   Manual Therapy Soft Tissue Mobilization: right  IT band and hamstring, STW/M to right IT and hamstring to decrease pain and tone, pt positioned in right side-lying for comfort with pillow between her knees    Modalities  Date:  Unattended Estim: Hip, IFC 80-150 hz, 15 mins, Pain and Tone Hot Pack: Hip, 15 mins, Pain   PATIENT EDUCATION:  Education details: Patient has been active right ankle range of motion exercises at home. Person educated: Patient Education method: Medical illustrator Education comprehension: verbalized understanding and returned demonstration  HOME  EXERCISE PROGRAM:   ASSESSMENT:  CLINICAL IMPRESSION: Patient did very well today.  She presented with bilateral shoes today.  She performed low-level Nustep without difficulty.  Following treatment she reports a significant reduction in pain.    OBJECTIVE IMPAIRMENTS: Abnormal gait, decreased activity tolerance, difficulty walking, decreased ROM, decreased strength, increased muscle spasms, and pain.   ACTIVITY LIMITATIONS: carrying, lifting, standing, stairs, and locomotion level  PARTICIPATION LIMITATIONS: meal prep, cleaning, laundry, and community activity  PERSONAL FACTORS: Time since onset of injury/illness/exacerbation and 1 comorbidity: multiple right foot/ankle surgeries and long-term use of right CAM boot.  are also affecting patient's functional outcome.   REHAB POTENTIAL: Good  CLINICAL DECISION MAKING: Evolving/moderate complexity  EVALUATION COMPLEXITY: Moderate   GOALS:  LONG TERM GOALS: Target date: 12/25/23.  Ind with a HEP. Goal status: INITIAL  2.  Right active dorsiflexion to 6 degrees.  Goal status: INITIAL  3.  Perform ADL's with right posterior thigh pain not > 3/10.  Goal status: INITIAL  4.  Walk with diabetic shoes in clinic 250 feet without antalgic.  Goal status: INITIAL  5.  Perform a reciprocating stair gait with one railing.  Goal status: INITIAL  PLAN:  PT FREQUENCY: 2x/week  PT DURATION: 6 weeks  PLANNED INTERVENTIONS: 97110-Therapeutic exercises, 97530- Therapeutic activity, [id number]- Neuromuscular re-education, 97535- Self Care, [id number]- Manual therapy, [id number]- Electrical stimulation (unattended), 97035- Ultrasound, Patient/Family education, Balance training, Stair training, Cryotherapy, and Moist heat  PLAN FOR NEXT SESSION: Nustep, modalities and STW/M to right hamstrings.  Gait activities.  Seated Rockerboard and dynadisc.   Maurice Ramseur, [place], PT 11/16/2023, 4:08 PM

## 2023-11-24 NOTE — Therapy (Incomplete)
OUTPATIENT PHYSICAL THERAPY LOWER EXTREMITY TREATMENT   Patient Name: Katherine Herrera MRN: [id number] DOB:[birth year], 58 y.o., female Today's Date: 11/24/2023  END OF SESSION:    Past Medical History:  Diagnosis Date   Anxiety    Arthritis    hands   B12 deficiency    Chronic constipation    Chronic pain syndrome    neck, lower back, and foot   COPD (chronic obstructive pulmonary disease) (HCC)    Diabetic ulcer of right foot (HCC)    Gout    possible   History of seizure    05-26-2020  per pt as child and last one in late teen's   History of sepsis    due to lower extremity cellulits 2019 left , right 03/ 2021   History of syncope    Hyperlipidemia    Hypothyroidism    Impaired range of motion of cervical spine    per pt hx cervical fusion C4 -- 7,  has to be careful about turning her head to the right can cause her to pass out   Insulin dependent type 2 diabetes mellitus [facility])    endocrinologist--- dr Talmage Nap    (05-26-2020 per pt check's blood sugar twice dialy and at times a third time,  fasting am blood sugar's---- 200 -- 250)   LBP (low back pain)    DR Noel Gerold   Migraine    Peripheral neuropathy    RBBB (right bundle branch block)    Tenosynovitis of foot and ankle 09/03/2013   TTS (tarsal tunnel syndrome) 2010   Left Foot [facility] in [place]   Vitamin D deficiency    Wears glasses    Past Surgical History:  Procedure Laterality Date   ANTERIOR CERVICAL DECOMP/DISCECTOMY FUSION  02-12-2004;  07-06-2006   C4--5 in 2005;  C5 --7  in  2007   APPLICATION OF WOUND VAC Right 07/05/2019   Procedure: Application Of Wound Vac;  Surgeon: Park Liter, DPM;  [facility];  Service: Podiatry;  Laterality: Right;   APPLICATION OF WOUND VAC Right 04/10/2022   Procedure: APPLICATION OF WOUND VAC, PLACEMENT OF ANTIBIOTIC BEADS;  Surgeon: Edwin Cap, DPM;  [facility];  Service: Podiatry;  Laterality: Right;   BONE BIOPSY Right 05/29/2020   Procedure: BONE BIOPSY;   Surgeon: Park Liter, DPM;  Location: [facility] [place];  Service: Podiatry;  Laterality: Right;   CARDIAC CATHETERIZATION  05-27-2002   dr Jacinto Halim   for positive cardiolite;  normal coronaries and LVF, ef 70%   CARPAL TUNNEL RELEASE Bilateral left 12-25-2008;  right 09-22-2009  both @[facility]    HARDWARE REMOVAL  01-07-2016  @[facility]    right foot   INCISION AND DRAINAGE OF WOUND Right 07/20/2019   Procedure: IRRIGATION AND DEBRIDEMENT foot;  Surgeon: Park Liter, DPM;  [facility];  Service: Podiatry;  Laterality: Right;   INCISION AND DRAINAGE OF WOUND Right 07/22/2019   Procedure: IRRIGATION AND DEBRIDEMENT RIGHT FOOT; APPLICATION OF WOUND VAC;  Surgeon: Park Liter, DPM;  [facility];  Service: Podiatry;  Laterality: Right;   INCISION AND DRAINAGE OF WOUND Right 07/24/2019   Procedure: IRRIGATION AND DEBRIDEMENT WOUND, APPLICATION OF WOUND VAC;  Surgeon: Park Liter, DPM;  [facility];  Service: Podiatry;  Laterality: Right;   INCISION AND DRAINAGE OF WOUND Right 05/29/2020   Procedure: RIGHT FOOT WOUND DEBRIDEMENT AND IRRIGATION;  Surgeon: Park Liter, DPM;  Location: [facility] [place];  Service: Podiatry;  Laterality: Right;   INCISION AND DRAINAGE OF WOUND Right 04/13/2022   Procedure: IRRIGATION AND DEBRIDEMENT WOUND;  Surgeon: Candelaria Stagers, DPM;  [facility];  Service: Podiatry;  Laterality: Right;   IRRIGATION AND DEBRIDEMENT FOOT Right 04/10/2022   Procedure: IRRIGATION AND DEBRIDEMENT FOOT;  Surgeon: Edwin Cap, DPM;  [facility];  Service: Podiatry;  Laterality: Right;   LUMBAR DISC SURGERY  07-07-2005   @[facility]    L5 - S1   METATARSAL HEAD EXCISION Bilateral 01/02/2019   Procedure: Left foot 5th metatarsal resection, wound excision, adjacent tissue transfer. Right fifth metatarsal head resection, fitfth metatarsal base resection, intermediate wound repair.;  Surgeon: Park Liter, DPM;  Location: [facility] [place];   Service: Podiatry;  Laterality: Bilateral;   METATARSAL HEAD EXCISION Right 05/12/2021   Procedure: Right 5th met base resection;  Surgeon: Park Liter, DPM;  [facility];  Service: Podiatry;  Laterality: Right;   METATARSAL OSTEOTOMY Right 07/05/2019   Procedure: RIGHT FOOT 5TH METATARSAL RESECTION; PERONEAL TENDONESIS, EXCISION OF ULCER, LOCAL TISSUE TRANSFER ;  Surgeon: Park Liter, DPM;  [facility];  Service: Podiatry;  Laterality: Right;   ORIF METATARSAL FRACTURE  09-22-2015  @[facility]    right 5th   TOTAL ABDOMINAL HYSTERECTOMY W/ BILATERAL SALPINGOOPHORECTOMY  2000 approx.   Patient Active Problem List   Diagnosis Date Noted   Cervical radiculopathy 03/08/2023   Cellulitis of right leg 01/23/2023   Osteomyelitis of foot (HCC) 04/08/2022   Finger pain, right 02/23/2022   Depression 02/23/2022   Thyroid nodule 09/09/2021   Statin myopathy 08/06/2021   Essential hypertension 10/09/2020   Pure hypercholesterolemia 10/09/2020   Chronic osteomyelitis involving right ankle and foot (HCC)    Cellulitis of right lower leg 01/10/2020   Cellulitis 01/02/2020   Peroneal tendonitis, right    Right foot ulcer (HCC) 07/04/2019   Capsulitis of metatarsophalangeal (MTP) joint of right foot    Exostosis of bone of foot    Polyneuropathy due to type 2 diabetes mellitus (HCC) 10/01/2018   Chronic pain 09/29/2018   Cellulitis of left lower leg    Cerumen impaction 08/02/2017   Constipation due to pain medication 05/22/2017   AKI (acute kidney injury) (HCC) 05/03/2017   Charcot foot due to diabetes mellitus (HCC) 04/23/2017   Pain from implanted hardware 01/06/2016   Cellulitis and abscess of toe of right foot 07/24/2015   DOE (dyspnea on exertion) 06/16/2015   Chest pain, atypical 06/16/2015   Family history of musculoskeletal disease 03/12/2015   Non-compliant behavior 12/24/2014   Callus of foot 03/25/2014   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   Metatarsal deformity, left  09/03/2013   Pes cavus 10/13/2012   Acquired tight Achilles tendon 10/13/2012   Paronychia 06/14/2012   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) 03/19/2012   Allergic rhinitis 03/19/2012   TTS (tarsal tunnel syndrome)    Pain in joint 01/04/2010   CBC, ABNORMAL 05/01/2009   B12 deficiency 01/05/2009   GOUT 01/05/2009   Anxiety disorder 07/28/2008   TOBACCO USE DISORDER/SMOKER-SMOKING CESSATION DISCUSSED 04/21/2008   FOOT PAIN 04/21/2008   Diabetic neuropathy (HCC) 08/27/2007   LOW BACK PAIN 08/27/2007   Dyslipidemia 08/20/2007   Hypothyroidism 05/22/2007   REFERRING PROVIDER: Nicholes Rough  REFERRING DIAG: LE weakness.  THERAPY DIAG:  No diagnosis found.  Rationale for Evaluation and Treatment: Rehabilitation  ONSET DATE: Ongoing.  SUBJECTIVE:   SUBJECTIVE STATEMENT: ***  PERTINENT HISTORY: DM, multiple left foot surgeries, Decubitus ulcer (  healed), DOS: 02/27/2023 Procedure: Right posterior tibial tendon lengthening Achilles tendon lengthening and tenodesis of the peroneal tendon PAIN:  Are you having pain? Yes: NPRS scale: 6/10. Pain location: Right ankle. Pain description: ache, sharp. Aggravating factors: Being off feet. Relieving factors: Standing and walking.  PRECAUTIONS: Other: Patient in right CAM boot but can be in shoes (ordered diabetic shoes that don't fit properly).   WEIGHT BEARING RESTRICTIONS:  Patient wbat with right CAM boot donned.  She walks some around her house without boot.  She has diabetic shoes that are not fitting properly.  FALLS:  Has patient fallen in last 6 months? Yes. Number of falls 2-3/10.  Recommended cane usage.  LIVING ENVIRONMENT: Lives in: House/apartment Has following equipment at home: None  OCCUPATION: Disabled.  PLOF: Independent with basic ADLs  PATIENT GOALS: Get rid of right posterior thigh pain that limits her ability to perform her ADL's.  OBJECTIVE:   POSTURE:  In supine:  Left ankle rests  in inversion.  Patient states this is much better since surgery.  PALPATION: The patient's CC is pain over her right hamstring muscle group.  She had no specific areas of right ankle pain and no c/o low back, gluteal or SIJ pain.  LOWER EXTREMITY ROM:  Active right ankle dorsiflexion is 3 degrees, plantarflexion is 28 degrees and eversion is 2 degrees.  LOWER EXTREMITY MMT:  Pain with resisted right knee flexion and pain pain in hamstring group with active right knee extension.  GAIT: Antalgic gait with right CAM boot donned.  TUG is 30 seconds.  TODAY'S TREATMENT SESSION:                                   11/24/23 EXERCISE LOG  Exercise Repetitions and Resistance Comments                       Blank cell = exercise not performed today   11/16/23:  Nustep level 2 x 10 minutes f/b STW/M x 13 minutes to patient's right hamstring and ITB with patient in the left sdly position with folded pillow between knees for comfort f/b HMP and HMP and IFC at 80-150 Hz on 40% scan x 20 minutes.  Normal modality response following removal of modality.  PATIENT EDUCATION:  Education details: Patient has been active right ankle range of motion exercises at home. Person educated: Patient Education method: Medical illustrator Education comprehension: verbalized understanding and returned demonstration  HOME EXERCISE PROGRAM:   ASSESSMENT:  CLINICAL IMPRESSION: ***  OBJECTIVE IMPAIRMENTS: Abnormal gait, decreased activity tolerance, difficulty walking, decreased ROM, decreased strength, increased muscle spasms, and pain.   ACTIVITY LIMITATIONS: carrying, lifting, standing, stairs, and locomotion level  PARTICIPATION LIMITATIONS: meal prep, cleaning, laundry, and community activity  PERSONAL FACTORS: Time since onset of injury/illness/exacerbation and 1 comorbidity: multiple right foot/ankle surgeries and long-term use of right CAM boot.  are also affecting patient's functional  outcome.   REHAB POTENTIAL: Good  CLINICAL DECISION MAKING: Evolving/moderate complexity  EVALUATION COMPLEXITY: Moderate   GOALS:  LONG TERM GOALS: Target date: 12/25/23.  Ind with a HEP. Goal status: INITIAL  2.  Right active dorsiflexion to 6 degrees.  Goal status: INITIAL  3.  Perform ADL's with right posterior thigh pain not > 3/10.  Goal status: INITIAL  4.  Walk with diabetic shoes in clinic 250 feet without antalgic.  Goal status: INITIAL  5.  Perform a reciprocating stair gait with one railing.  Goal status: INITIAL  PLAN:  PT FREQUENCY: 2x/week  PT DURATION: 6 weeks  PLANNED INTERVENTIONS: 97110-Therapeutic exercises, 97530- Therapeutic activity, [id number]- Neuromuscular re-education, 97535- Self Care, [id number]- Manual therapy, 97014- Electrical stimulation (unattended), 97035- Ultrasound, Patient/Family education, Balance training, Stair training, Cryotherapy, and Moist heat  PLAN FOR NEXT SESSION: Nustep, modalities and STW/M to right hamstrings.  Gait activities.  Seated Rockerboard and dynadisc.   Granville Lewis, PT 11/24/2023, 11:41 AM

## 2023-11-28 ENCOUNTER — Ambulatory Visit: Payer: 59

## 2023-11-28 DIAGNOSIS — M79605 Pain in left leg: Secondary | ICD-10-CM | POA: Diagnosis not present

## 2023-11-28 DIAGNOSIS — M25672 Stiffness of left ankle, not elsewhere classified: Secondary | ICD-10-CM | POA: Diagnosis not present

## 2023-11-28 NOTE — Therapy (Signed)
OUTPATIENT PHYSICAL THERAPY LOWER EXTREMITY TREATMENT   Patient Name: Katherine Herrera MRN: [id number] DOB:28-Jun-1966, 58 y.o., female Today's Date: 11/28/2023  END OF SESSION:  PT End of Session - 11/28/23 1440     Visit Number 4    Number of Visits 12    Date for PT Re-Evaluation 12/25/23    PT Start Time 1438   Patient arrived late to her appointment.   PT Stop Time 1530    PT Time Calculation (min) 52 min    Activity Tolerance Patient limited by pain    Behavior During Therapy WFL for tasks assessed/performed              Past Medical History:  Diagnosis Date   Anxiety    Arthritis    hands   B12 deficiency    Chronic constipation    Chronic pain syndrome    neck, lower back, and foot   COPD (chronic obstructive pulmonary disease) (HCC)    Diabetic ulcer of right foot (HCC)    Gout    possible   History of seizure    05-26-2020  per pt as child and last one in late teen's   History of sepsis    due to lower extremity cellulits 2019 left , right 03/ 2021   History of syncope    Hyperlipidemia    Hypothyroidism    Impaired range of motion of cervical spine    per pt hx cervical fusion C4 -- 7,  has to be careful about turning her head to the right can cause her to pass out   Insulin dependent type 2 diabetes mellitus [facility])    endocrinologist--- dr Talmage Nap    (05-26-2020 per pt check's blood sugar twice dialy and at times a third time,  fasting am blood sugar's---- 200 -- 250)   LBP (low back pain)    DR Noel Gerold   Migraine    Peripheral neuropathy    RBBB (right bundle branch block)    Tenosynovitis of foot and ankle 09/03/2013   TTS (tarsal tunnel syndrome) 2010   Left Foot [facility] in [place]   Vitamin D deficiency    Wears glasses    Past Surgical History:  Procedure Laterality Date   ANTERIOR CERVICAL DECOMP/DISCECTOMY FUSION  02-12-2004;  07-06-2006   C4--5 in 2005;  C5 --7  in  2007   APPLICATION OF WOUND VAC Right 07/05/2019   Procedure: Application Of Wound  Vac;  Surgeon: Park Liter, DPM;  [facility];  Service: Podiatry;  Laterality: Right;   APPLICATION OF WOUND VAC Right 04/10/2022   Procedure: APPLICATION OF WOUND VAC, PLACEMENT OF ANTIBIOTIC BEADS;  Surgeon: Edwin Cap, DPM;  [facility];  Service: Podiatry;  Laterality: Right;   BONE BIOPSY Right 05/29/2020   Procedure: BONE BIOPSY;  Surgeon: Park Liter, DPM;  Location: [facility];  Service: Podiatry;  Laterality: Right;   CARDIAC CATHETERIZATION  05-27-2002   dr Jacinto Halim   for positive cardiolite;  normal coronaries and LVF, ef 70%   CARPAL TUNNEL RELEASE Bilateral left 12-25-2008;  right 09-22-2009  both @[facility]    HARDWARE REMOVAL  01-07-2016  @[facility]    right foot   INCISION AND DRAINAGE OF WOUND Right 07/20/2019   Procedure: IRRIGATION AND DEBRIDEMENT foot;  Surgeon: Park Liter, DPM;  [facility];  Service: Podiatry;  Laterality: Right;   INCISION AND DRAINAGE OF WOUND Right 07/22/2019   Procedure: IRRIGATION AND DEBRIDEMENT RIGHT FOOT; APPLICATION  OF WOUND VAC;  Surgeon: Park Liter, DPM;  [facility];  Service: Podiatry;  Laterality: Right;   INCISION AND DRAINAGE OF WOUND Right 07/24/2019   Procedure: IRRIGATION AND DEBRIDEMENT WOUND, APPLICATION OF WOUND VAC;  Surgeon: Park Liter, DPM;  [facility];  Service: Podiatry;  Laterality: Right;   INCISION AND DRAINAGE OF WOUND Right 05/29/2020   Procedure: RIGHT FOOT WOUND DEBRIDEMENT AND IRRIGATION;  Surgeon: Park Liter, DPM;  Location: [facility];  Service: Podiatry;  Laterality: Right;   INCISION AND DRAINAGE OF WOUND Right 04/13/2022   Procedure: IRRIGATION AND DEBRIDEMENT WOUND;  Surgeon: Candelaria Stagers, DPM;  [facility];  Service: Podiatry;  Laterality: Right;   IRRIGATION AND DEBRIDEMENT FOOT Right 04/10/2022   Procedure: IRRIGATION AND DEBRIDEMENT FOOT;  Surgeon: Edwin Cap, DPM;  [facility];  Service: Podiatry;  Laterality: Right;    LUMBAR DISC SURGERY  07-07-2005   @[facility]    L5 - S1   METATARSAL HEAD EXCISION Bilateral 01/02/2019   Procedure: Left foot 5th metatarsal resection, wound excision, adjacent tissue transfer. Right fifth metatarsal head resection, fitfth metatarsal base resection, intermediate wound repair.;  Surgeon: Park Liter, DPM;  Location: [facility];  Service: Podiatry;  Laterality: Bilateral;   METATARSAL HEAD EXCISION Right 05/12/2021   Procedure: Right 5th met base resection;  Surgeon: Park Liter, DPM;  [facility];  Service: Podiatry;  Laterality: Right;   METATARSAL OSTEOTOMY Right 07/05/2019   Procedure: RIGHT FOOT 5TH METATARSAL RESECTION; PERONEAL TENDONESIS, EXCISION OF ULCER, LOCAL TISSUE TRANSFER ;  Surgeon: Park Liter, DPM;  [facility];  Service: Podiatry;  Laterality: Right;   ORIF METATARSAL FRACTURE  09-22-2015  @[facility]    right 5th   TOTAL ABDOMINAL HYSTERECTOMY W/ BILATERAL SALPINGOOPHORECTOMY  2000 approx.   Patient Active Problem List   Diagnosis Date Noted   Cervical radiculopathy 03/08/2023   Cellulitis of right leg 01/23/2023   Osteomyelitis of foot (HCC) 04/08/2022   Finger pain, right 02/23/2022   Depression 02/23/2022   Thyroid nodule 09/09/2021   Statin myopathy 08/06/2021   Essential hypertension 10/09/2020   Pure hypercholesterolemia 10/09/2020   Chronic osteomyelitis involving right ankle and foot (HCC)    Cellulitis of right lower leg 01/10/2020   Cellulitis 01/02/2020   Peroneal tendonitis, right    Right foot ulcer (HCC) 07/04/2019   Capsulitis of metatarsophalangeal (MTP) joint of right foot    Exostosis of bone of foot    Polyneuropathy due to type 2 diabetes mellitus (HCC) 10/01/2018   Chronic pain 09/29/2018   Cellulitis of left lower leg    Cerumen impaction 08/02/2017   Constipation due to pain medication 05/22/2017   AKI (acute kidney injury) (HCC) 05/03/2017   Charcot foot due to diabetes mellitus (HCC) 04/23/2017   Pain  from implanted hardware 01/06/2016   Cellulitis and abscess of toe of right foot 07/24/2015   DOE (dyspnea on exertion) 06/16/2015   Chest pain, atypical 06/16/2015   Family history of musculoskeletal disease 03/12/2015   Non-compliant behavior 12/24/2014   Callus of foot 03/25/2014   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   Metatarsal deformity, left 09/03/2013   Pes cavus 10/13/2012   Acquired tight Achilles tendon 10/13/2012   Paronychia 06/14/2012   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) 03/19/2012   Allergic rhinitis 03/19/2012   TTS (tarsal tunnel syndrome)    Pain in joint 01/04/2010   CBC, ABNORMAL 05/01/2009   B12 deficiency  01/05/2009   GOUT 01/05/2009   Anxiety disorder 07/28/2008   TOBACCO USE DISORDER/SMOKER-SMOKING CESSATION DISCUSSED 04/21/2008   FOOT PAIN 04/21/2008   Diabetic neuropathy (HCC) 08/27/2007   LOW BACK PAIN 08/27/2007   Dyslipidemia 08/20/2007   Hypothyroidism 05/22/2007   REFERRING PROVIDER: Nicholes Rough  REFERRING DIAG: LE weakness.  THERAPY DIAG:  Pain in left leg  Stiffness of left ankle, not elsewhere classified  Rationale for Evaluation and Treatment: Rehabilitation  ONSET DATE: Ongoing.  SUBJECTIVE:   SUBJECTIVE STATEMENT: Patient reports that she is hurting a lot today due to being on her feet a lot today.   PERTINENT HISTORY: DM, multiple left foot surgeries, Decubitus ulcer (healed), DOS: 02/27/2023 Procedure: Right posterior tibial tendon lengthening Achilles tendon lengthening and tenodesis of the peroneal tendon PAIN:  Are you having pain? Yes: NPRS scale: 8/10. Pain location: Right ankle. Pain description: ache, sharp. Aggravating factors: Being off feet. Relieving factors: Standing and walking.  PRECAUTIONS: Other: Patient in right CAM boot but can be in shoes (ordered diabetic shoes that don't fit properly).   WEIGHT BEARING RESTRICTIONS:  Patient wbat with right CAM boot donned.  She walks some around her  house without boot.  She has diabetic shoes that are not fitting properly.  FALLS:  Has patient fallen in last 6 months? Yes. Number of falls 2-3/10.  Recommended cane usage.  LIVING ENVIRONMENT: Lives in: House/apartment Has following equipment at home: None  OCCUPATION: Disabled.  PLOF: Independent with basic ADLs  PATIENT GOALS: Get rid of right posterior thigh pain that limits her ability to perform her ADL's.  OBJECTIVE:   POSTURE:  In supine:  Left ankle rests in inversion.  Patient states this is much better since surgery.  PALPATION: The patient's CC is pain over her right hamstring muscle group.  She had no specific areas of right ankle pain and no c/o low back, gluteal or SIJ pain.  LOWER EXTREMITY ROM:  Active right ankle dorsiflexion is 3 degrees, plantarflexion is 28 degrees and eversion is 2 degrees.  LOWER EXTREMITY MMT:  Pain with resisted right knee flexion and pain pain in hamstring group with active right knee extension.  GAIT: Antalgic gait with right CAM boot donned.  TUG is 30 seconds.  TODAY'S TREATMENT SESSION:                                   11/28/23 EXERCISE LOG  Exercise Repetitions and Resistance Comments  Nustep  L3 x 15 minutes    LAQ 2 minutes (1 minute each LE)  Partial ROM due to RLE pain  Seated hip ADD isometric  2 x 10 reps w/ 3 second hold    Glute sets  Attempted, but limited due to pain        Blank cell = exercise not performed today  Modalities: no redness or adverse reaction to today's modalities  Date:  Unattended Estim: right gluteals, pre mod @ 80-150 Hz, 15 mins, Pain and Tone Hot Pack: Hip, 15 mins, Pain and Tone  11/16/23:  Nustep level 2 x 10 minutes f/b STW/M x 13 minutes to patient's right hamstring and ITB with patient in the left sdly position with folded pillow between knees for comfort f/b HMP and HMP and IFC at 80-150 Hz on 40% scan x 20 minutes.  Normal modality response following removal of  modality.  PATIENT EDUCATION:  Education details: activity modification Person educated: Patient  Education method: Explanation Education comprehension: verbalized understanding  HOME EXERCISE PROGRAM:   ASSESSMENT:  CLINICAL IMPRESSION: Patient was introduced to multiple the whole arc quads in a seated hip abduction isometric for light muscular strengthening. However, her high pain severity and irritability significantly limited her ability to be introduced to other new interventions. Glute sets were attempted, but she was unable to complete these interventions due to her familiar pain.  She was educated on activity modification to avoid significantly aggravating her familiar symptoms. She reported that she was still hurting upon the conclusion of treatment. Recommend that she continue with skilled physical therapy to address her remaining impairments to maximize her functional mobility.  OBJECTIVE IMPAIRMENTS: Abnormal gait, decreased activity tolerance, difficulty walking, decreased ROM, decreased strength, increased muscle spasms, and pain.   ACTIVITY LIMITATIONS: carrying, lifting, standing, stairs, and locomotion level  PARTICIPATION LIMITATIONS: meal prep, cleaning, laundry, and community activity  PERSONAL FACTORS: Time since onset of injury/illness/exacerbation and 1 comorbidity: multiple right foot/ankle surgeries and long-term use of right CAM boot.  are also affecting patient's functional outcome.   REHAB POTENTIAL: Good  CLINICAL DECISION MAKING: Evolving/moderate complexity  EVALUATION COMPLEXITY: Moderate   GOALS:  LONG TERM GOALS: Target date: 12/25/23.  Ind with a HEP. Goal status: INITIAL  2.  Right active dorsiflexion to 6 degrees.  Goal status: INITIAL  3.  Perform ADL's with right posterior thigh pain not > 3/10.  Goal status: INITIAL  4.  Walk with diabetic shoes in clinic 250 feet without antalgic.  Goal status: INITIAL  5.  Perform a reciprocating  stair gait with one railing.  Goal status: INITIAL  PLAN:  PT FREQUENCY: 2x/week  PT DURATION: 6 weeks  PLANNED INTERVENTIONS: 97110-Therapeutic exercises, 97530- Therapeutic activity, [id number]- Neuromuscular re-education, 97535- Self Care, [id number]- Manual therapy, 97014- Electrical stimulation (unattended), 97035- Ultrasound, Patient/Family education, Balance training, Stair training, Cryotherapy, and Moist heat  PLAN FOR NEXT SESSION: Nustep, modalities and STW/M to right hamstrings.  Gait activities.  Seated Rockerboard and dynadisc.   Granville Lewis, PT 11/28/2023, 3:50 PM

## 2023-12-08 ENCOUNTER — Ambulatory Visit: Payer: 59 | Attending: Podiatry | Admitting: *Deleted

## 2023-12-08 DIAGNOSIS — M25672 Stiffness of left ankle, not elsewhere classified: Secondary | ICD-10-CM | POA: Insufficient documentation

## 2023-12-08 DIAGNOSIS — M79605 Pain in left leg: Secondary | ICD-10-CM | POA: Insufficient documentation

## 2023-12-08 NOTE — Therapy (Signed)
 OUTPATIENT PHYSICAL THERAPY LOWER EXTREMITY TREATMENT   Patient Name: Katherine Herrera MRN: [id number] DOB:1965/11/29, 58 y.o., female Today's Date: 12/08/2023  END OF SESSION:  PT End of Session - 12/08/23 1151     Visit Number 5    Number of Visits 12    Date for PT Re-Evaluation 12/25/23    PT Start Time 1145    PT Stop Time 1236    PT Time Calculation (min) 51 min              Past Medical History:  Diagnosis Date   Anxiety    Arthritis    hands   B12 deficiency    Chronic constipation    Chronic pain syndrome    neck, lower back, and foot   COPD (chronic obstructive pulmonary disease) (HCC)    Diabetic ulcer of right foot (HCC)    Gout    possible   History of seizure    05-26-2020  per pt as child and last one in late teen's   History of sepsis    due to lower extremity cellulits 2019 left , right 03/ 2021   History of syncope    Hyperlipidemia    Hypothyroidism    Impaired range of motion of cervical spine    per pt hx cervical fusion C4 -- 7,  has to be careful about turning her head to the right can cause her to pass out   Insulin  dependent type 2 diabetes mellitus [facility])    endocrinologist--- dr tommas    (05-26-2020 per pt check's blood sugar twice dialy and at times a third time,  fasting am blood sugar's---- 200 -- 250)   LBP (low back pain)    DR GUST   Migraine    Peripheral neuropathy    RBBB (right bundle branch block)    Tenosynovitis of foot and ankle 09/03/2013   TTS (tarsal tunnel syndrome) 2010   Left Foot [facility] in [place]   Vitamin D  deficiency    Wears glasses    Past Surgical History:  Procedure Laterality Date   ANTERIOR CERVICAL DECOMP/DISCECTOMY FUSION  02-12-2004;  07-06-2006   C4--5 in 2005;  C5 --7  in  2007   APPLICATION OF WOUND VAC Right 07/05/2019   Procedure: Application Of Wound Vac;  Surgeon: Gretel Ozell PARAS, DPM;  [facility];  Service: Podiatry;  Laterality: Right;   APPLICATION OF WOUND VAC Right 04/10/2022    Procedure: APPLICATION OF WOUND VAC, PLACEMENT OF ANTIBIOTIC BEADS;  Surgeon: Silva Juliene SAUNDERS, DPM;  [facility];  Service: Podiatry;  Laterality: Right;   BONE BIOPSY Right 05/29/2020   Procedure: BONE BIOPSY;  Surgeon: Gretel Ozell PARAS, DPM;  Location: [facility];  Service: Podiatry;  Laterality: Right;   CARDIAC CATHETERIZATION  05-27-2002   dr ladona   for positive cardiolite;  normal coronaries and LVF, ef 70%   CARPAL TUNNEL RELEASE Bilateral left 12-25-2008;  right 09-22-2009  both @[facility]    HARDWARE REMOVAL  01-07-2016  @[facility]    right foot   INCISION AND DRAINAGE OF WOUND Right 07/20/2019   Procedure: IRRIGATION AND DEBRIDEMENT foot;  Surgeon: Gretel Ozell PARAS, DPM;  [facility];  Service: Podiatry;  Laterality: Right;   INCISION AND DRAINAGE OF WOUND Right 07/22/2019   Procedure: IRRIGATION AND DEBRIDEMENT RIGHT FOOT; APPLICATION OF WOUND VAC;  Surgeon: Gretel Ozell PARAS, DPM;  [facility];  Service: Podiatry;  Laterality: Right;   INCISION AND DRAINAGE OF WOUND  Right 07/24/2019   Procedure: IRRIGATION AND DEBRIDEMENT WOUND, APPLICATION OF WOUND VAC;  Surgeon: Gretel Ozell PARAS, DPM;  [facility];  Service: Podiatry;  Laterality: Right;   INCISION AND DRAINAGE OF WOUND Right 05/29/2020   Procedure: RIGHT FOOT WOUND DEBRIDEMENT AND IRRIGATION;  Surgeon: Gretel Ozell PARAS, DPM;  Location: [facility];  Service: Podiatry;  Laterality: Right;   INCISION AND DRAINAGE OF WOUND Right 04/13/2022   Procedure: IRRIGATION AND DEBRIDEMENT WOUND;  Surgeon: Tobie Franky SQUIBB, DPM;  [facility];  Service: Podiatry;  Laterality: Right;   IRRIGATION AND DEBRIDEMENT FOOT Right 04/10/2022   Procedure: IRRIGATION AND DEBRIDEMENT FOOT;  Surgeon: Silva Juliene SAUNDERS, DPM;  [facility];  Service: Podiatry;  Laterality: Right;   LUMBAR DISC SURGERY  07-07-2005   @[facility]    L5 - S1   METATARSAL HEAD EXCISION Bilateral 01/02/2019   Procedure: Left foot 5th metatarsal resection,  wound excision, adjacent tissue transfer. Right fifth metatarsal head resection, fitfth metatarsal base resection, intermediate wound repair.;  Surgeon: Gretel Ozell PARAS, DPM;  Location: [facility];  Service: Podiatry;  Laterality: Bilateral;   METATARSAL HEAD EXCISION Right 05/12/2021   Procedure: Right 5th met base resection;  Surgeon: Gretel Ozell PARAS, DPM;  [facility];  Service: Podiatry;  Laterality: Right;   METATARSAL OSTEOTOMY Right 07/05/2019   Procedure: RIGHT FOOT 5TH METATARSAL RESECTION; PERONEAL TENDONESIS, EXCISION OF ULCER, LOCAL TISSUE TRANSFER ;  Surgeon: Gretel Ozell PARAS, DPM;  [facility];  Service: Podiatry;  Laterality: Right;   ORIF METATARSAL FRACTURE  09-22-2015  @[facility]    right 5th   TOTAL ABDOMINAL HYSTERECTOMY W/ BILATERAL SALPINGOOPHORECTOMY  2000 approx.   Patient Active Problem List   Diagnosis Date Noted   Cervical radiculopathy 03/08/2023   Cellulitis of right leg 01/23/2023   Osteomyelitis of foot (HCC) 04/08/2022   Finger pain, right 02/23/2022   Depression 02/23/2022   Thyroid  nodule 09/09/2021   Statin myopathy 08/06/2021   Essential hypertension 10/09/2020   Pure hypercholesterolemia 10/09/2020   Chronic osteomyelitis involving right ankle and foot (HCC)    Cellulitis of right lower leg 01/10/2020   Cellulitis 01/02/2020   Peroneal tendonitis, right    Right foot ulcer (HCC) 07/04/2019   Capsulitis of metatarsophalangeal (MTP) joint of right foot    Exostosis of bone of foot    Polyneuropathy due to type 2 diabetes mellitus (HCC) 10/01/2018   Chronic pain 09/29/2018   Cellulitis of left lower leg    Cerumen impaction 08/02/2017   Constipation due to pain medication 05/22/2017   AKI (acute kidney injury) (HCC) 05/03/2017   Charcot foot due to diabetes mellitus (HCC) 04/23/2017   Pain from implanted hardware 01/06/2016   Cellulitis and abscess of toe of right foot 07/24/2015   DOE (dyspnea on exertion) 06/16/2015   Chest  pain, atypical 06/16/2015   Family history of musculoskeletal disease 03/12/2015   Non-compliant behavior 12/24/2014   Callus of foot 03/25/2014   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   Metatarsal deformity, left 09/03/2013   Pes cavus 10/13/2012   Acquired tight Achilles tendon 10/13/2012   Paronychia 06/14/2012   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) 03/19/2012   Allergic rhinitis 03/19/2012   TTS (tarsal tunnel syndrome)    Pain in joint 01/04/2010   CBC, ABNORMAL 05/01/2009   B12 deficiency 01/05/2009   GOUT 01/05/2009   Anxiety disorder 07/28/2008   TOBACCO USE DISORDER/SMOKER-SMOKING CESSATION DISCUSSED 04/21/2008   FOOT PAIN 04/21/2008   Diabetic  neuropathy (HCC) 08/27/2007   LOW BACK PAIN 08/27/2007   Dyslipidemia 08/20/2007   Hypothyroidism 05/22/2007   REFERRING PROVIDER: Franky Blanch  REFERRING DIAG: LE weakness.  THERAPY DIAG:  Pain in left leg  Stiffness of left ankle, not elsewhere classified  Rationale for Evaluation and Treatment: Rehabilitation  ONSET DATE: Ongoing.  SUBJECTIVE:   SUBJECTIVE STATEMENT: Patient reports that her RT hip is hurting a lot today. Wearing diabetic shoes Bil. Having surgery on RT big toe in April maybe  PERTINENT HISTORY: DM, multiple left foot surgeries, Decubitus ulcer (healed), DOS: 02/27/2023 Procedure: Right posterior tibial tendon lengthening Achilles tendon lengthening and tenodesis of the peroneal tendon PAIN:  Are you having pain? Yes: NPRS scale: 8/10. Pain location: Right ankle. Pain description: ache, sharp. Aggravating factors: Being off feet. Relieving factors: Standing and walking.  PRECAUTIONS: Other: Patient in right CAM boot but can be in shoes (ordered diabetic shoes that don't fit properly).   WEIGHT BEARING RESTRICTIONS:  Patient wbat with right CAM boot donned.  She walks some around her house without boot.  She has diabetic shoes that are not fitting properly.  FALLS:  Has patient  fallen in last 6 months? Yes. Number of falls 2-3/10.  Recommended cane usage.  LIVING ENVIRONMENT: Lives in: House/apartment Has following equipment at home: None  OCCUPATION: Disabled.  PLOF: Independent with basic ADLs  PATIENT GOALS: Get rid of right posterior thigh pain that limits her ability to perform her ADL's.  OBJECTIVE:   POSTURE:  In supine:  Left ankle rests in inversion.  Patient states this is much better since surgery.  PALPATION: The patient's CC is pain over her right hamstring muscle group.  She had no specific areas of right ankle pain and no c/o low back, gluteal or SIJ pain.  LOWER EXTREMITY ROM:  Active right ankle dorsiflexion is 3 degrees, plantarflexion is 28 degrees and eversion is 2 degrees.  LOWER EXTREMITY MMT:  Pain with resisted right knee flexion and pain pain in hamstring group with active right knee extension.  GAIT: Antalgic gait with right CAM boot donned.  TUG is 30 seconds.  TODAY'S TREATMENT SESSION:                                   12/08/23 EXERCISE LOG  Exercise Repetitions and Resistance Comments  Nustep  L3 x 15 minutes    LAQ 2 minutes RT LE 2# Partial ROM due to RLE pain  Seated hip ADD isometric  2 x 10 reps w/ 5 second hold    Glute sets          Blank cell = exercise not performed today  STW/M x 13 minutes to patient's right hamstring and ITB with patient in the left sdly Modalities: no redness or adverse reaction to today's modalities  Date:  Unattended Estim: right ITB, pre mod @ 80-150 Hz, 15 mins, Pain and Tone Hot Pack: Hip, 15 mins, Pain and Tone  11/16/23:  Nustep level 2 x 10 minutes f/b STW/M x 13 minutes to patient's right hamstring and ITB with patient in the left sdly position with folded pillow between knees for comfort f/b HMP and HMP and IFC at 80-150 Hz on 40% scan x 20 minutes.  Normal modality response following removal of modality.  PATIENT EDUCATION:  Education details: activity modification Person  educated: Patient Education method: Explanation Education comprehension: verbalized understanding  HOME EXERCISE PROGRAM:  ASSESSMENT:  CLINICAL IMPRESSION: Patient was able to continue with light therex for RT LE strengthening, but unable to progress due to pain. STW performed  to RT ITB with notable tightness and soreness. LTG's are On going at this time due to pain and ROM deficits  OBJECTIVE IMPAIRMENTS: Abnormal gait, decreased activity tolerance, difficulty walking, decreased ROM, decreased strength, increased muscle spasms, and pain.   ACTIVITY LIMITATIONS: carrying, lifting, standing, stairs, and locomotion level  PARTICIPATION LIMITATIONS: meal prep, cleaning, laundry, and community activity  PERSONAL FACTORS: Time since onset of injury/illness/exacerbation and 1 comorbidity: multiple right foot/ankle surgeries and long-term use of right CAM boot.  are also affecting patient's functional outcome.   REHAB POTENTIAL: Good  CLINICAL DECISION MAKING: Evolving/moderate complexity  EVALUATION COMPLEXITY: Moderate   GOALS:  LONG TERM GOALS: Target date: 12/25/23.  Ind with a HEP. Goal status: Partially met  2.  Right active dorsiflexion to 6 degrees.  Goal status: On going  3.  Perform ADL's with right posterior thigh pain not > 3/10.  Goal status: On going  4.  Walk with diabetic shoes in clinic 250 feet without antalgic.  Goal status: On going  5.  Perform a reciprocating stair gait with one railing.  Goal status: On going  PLAN:  PT FREQUENCY: 2x/week  PT DURATION: 6 weeks  PLANNED INTERVENTIONS: 97110-Therapeutic exercises, 97530- Therapeutic activity, [id number]- Neuromuscular re-education, 97535- Self Care, [id number]- Manual therapy, 97014- Electrical stimulation (unattended), 97035- Ultrasound, Patient/Family education, Balance training, Stair training, Cryotherapy, and Moist heat  PLAN FOR NEXT SESSION: Nustep, modalities and STW/M to right hamstrings.  Gait  activities.  Seated Rockerboard and dynadisc.   Jennifr Gaeta,CHRIS, PTA 12/08/2023, 12:37 PM

## 2023-12-10 DIAGNOSIS — E1165 Type 2 diabetes mellitus with hyperglycemia: Secondary | ICD-10-CM | POA: Diagnosis not present

## 2023-12-13 ENCOUNTER — Ambulatory Visit: Payer: 59

## 2023-12-13 DIAGNOSIS — M25672 Stiffness of left ankle, not elsewhere classified: Secondary | ICD-10-CM

## 2023-12-13 DIAGNOSIS — M79605 Pain in left leg: Secondary | ICD-10-CM

## 2023-12-13 NOTE — Therapy (Signed)
OUTPATIENT PHYSICAL THERAPY LOWER EXTREMITY TREATMENT   Patient Name: Katherine Herrera MRN: [id number] DOB:02-10-1966, 58 y.o., female Today's Date: 12/13/2023  END OF SESSION:  PT End of Session - 12/13/23 1443     Visit Number 6    Number of Visits 12    Date for PT Re-Evaluation 12/25/23    PT Start Time 1434    PT Stop Time 1515    PT Time Calculation (min) 41 min    Activity Tolerance Patient limited by pain    Behavior During Therapy [facility] for tasks assessed/performed               Past Medical History:  Diagnosis Date   Anxiety    Arthritis    hands   B12 deficiency    Chronic constipation    Chronic pain syndrome    neck, lower back, and foot   COPD (chronic obstructive pulmonary disease) (HCC)    Diabetic ulcer of right foot (HCC)    Gout    possible   History of seizure    05-26-2020  per pt as child and last one in late teen's   History of sepsis    due to lower extremity cellulits 2019 left , right 03/ 2021   History of syncope    Hyperlipidemia    Hypothyroidism    Impaired range of motion of cervical spine    per pt hx cervical fusion C4 -- 7,  has to be careful about turning her head to the right can cause her to pass out   Insulin dependent type 2 diabetes mellitus [facility])    endocrinologist--- dr Talmage Nap    (05-26-2020 per pt check's blood sugar twice dialy and at times a third time,  fasting am blood sugar's---- 200 -- 250)   LBP (low back pain)    DR Noel Gerold   Migraine    Peripheral neuropathy    RBBB (right bundle branch block)    Tenosynovitis of foot and ankle 09/03/2013   TTS (tarsal tunnel syndrome) 2010   Left Foot [place] in [place]   Vitamin D deficiency    Wears glasses    Past Surgical History:  Procedure Laterality Date   ANTERIOR CERVICAL DECOMP/DISCECTOMY FUSION  02-12-2004;  07-06-2006   C4--5 in 2005;  C5 --7  in  2007   APPLICATION OF WOUND VAC Right 07/05/2019   Procedure: Application Of Wound Vac;  Surgeon: Park Liter, DPM;   Location: [facility];  Service: Podiatry;  Laterality: Right;   APPLICATION OF WOUND VAC Right 04/10/2022   Procedure: APPLICATION OF WOUND VAC, PLACEMENT OF ANTIBIOTIC BEADS;  Surgeon: Edwin Cap, DPM;  Location: [facility];  Service: Podiatry;  Laterality: Right;   BONE BIOPSY Right 05/29/2020   Procedure: BONE BIOPSY;  Surgeon: Park Liter, DPM;  Location: [facility];  Service: Podiatry;  Laterality: Right;   CARDIAC CATHETERIZATION  05-27-2002   dr Jacinto Halim   for positive cardiolite;  normal coronaries and LVF, ef 70%   CARPAL TUNNEL RELEASE Bilateral left 12-25-2008;  right 09-22-2009  both @[facility]    HARDWARE REMOVAL  01-07-2016  @[facility]    right foot   INCISION AND DRAINAGE OF WOUND Right 07/20/2019   Procedure: IRRIGATION AND DEBRIDEMENT foot;  Surgeon: Park Liter, DPM;  Location: [facility];  Service: Podiatry;  Laterality: Right;   INCISION AND DRAINAGE OF WOUND Right 07/22/2019   Procedure: IRRIGATION AND DEBRIDEMENT RIGHT FOOT; APPLICATION OF WOUND VAC;  Surgeon: Samuella Cota,  Nolon Bussing, DPM;  Location: [facility];  Service: Podiatry;  Laterality: Right;   INCISION AND DRAINAGE OF WOUND Right 07/24/2019   Procedure: IRRIGATION AND DEBRIDEMENT WOUND, APPLICATION OF WOUND VAC;  Surgeon: Park Liter, DPM;  Location: [facility];  Service: Podiatry;  Laterality: Right;   INCISION AND DRAINAGE OF WOUND Right 05/29/2020   Procedure: RIGHT FOOT WOUND DEBRIDEMENT AND IRRIGATION;  Surgeon: Park Liter, DPM;  Location: [facility];  Service: Podiatry;  Laterality: Right;   INCISION AND DRAINAGE OF WOUND Right 04/13/2022   Procedure: IRRIGATION AND DEBRIDEMENT WOUND;  Surgeon: Candelaria Stagers, DPM;  Location: [facility];  Service: Podiatry;  Laterality: Right;   IRRIGATION AND DEBRIDEMENT FOOT Right 04/10/2022   Procedure: IRRIGATION AND DEBRIDEMENT FOOT;  Surgeon: Edwin Cap, DPM;  Location: [facility];  Service: Podiatry;  Laterality: Right;   LUMBAR DISC SURGERY  07-07-2005   @[facility]    L5  - S1   METATARSAL HEAD EXCISION Bilateral 01/02/2019   Procedure: Left foot 5th metatarsal resection, wound excision, adjacent tissue transfer. Right fifth metatarsal head resection, fitfth metatarsal base resection, intermediate wound repair.;  Surgeon: Park Liter, DPM;  Location[facility] ;  Service: Podiatry;  Laterality: Bilateral;   METATARSAL HEAD EXCISION Right 05/12/2021   Procedure: Right 5th met base resection;  Surgeon: Park Liter, DPM;  Location: [facility];  Service: Podiatry;  Laterality: Right;   METATARSAL OSTEOTOMY Right 07/05/2019   Procedure: RIGHT FOOT 5TH METATARSAL RESECTION; PERONEAL TENDONESIS, EXCISION OF ULCER, LOCAL TISSUE TRANSFER ;  Surgeon: Park Liter, DPM;  Location: [facility];  Service: Podiatry;  Laterality: Right;   ORIF METATARSAL FRACTURE  09-22-2015  @[facility]    right 5th   TOTAL ABDOMINAL HYSTERECTOMY W/ BILATERAL SALPINGOOPHORECTOMY  2000 approx.   Patient Active Problem List   Diagnosis Date Noted   Cervical radiculopathy 03/08/2023   Cellulitis of right leg 01/23/2023   Osteomyelitis of foot (HCC) 04/08/2022   Finger pain, right 02/23/2022   Depression 02/23/2022   Thyroid nodule 09/09/2021   Statin myopathy 08/06/2021   Essential hypertension 10/09/2020   Pure hypercholesterolemia 10/09/2020   Chronic osteomyelitis involving right ankle and foot (HCC)    Cellulitis of right lower leg 01/10/2020   Cellulitis 01/02/2020   Peroneal tendonitis, right    Right foot ulcer (HCC) 07/04/2019   Capsulitis of metatarsophalangeal (MTP) joint of right foot    Exostosis of bone of foot    Polyneuropathy due to type 2 diabetes mellitus (HCC) 10/01/2018   Chronic pain 09/29/2018   Cellulitis of left lower leg    Cerumen impaction 08/02/2017   Constipation due to pain medication 05/22/2017   AKI (acute kidney injury) (HCC) 05/03/2017   Charcot foot due to diabetes mellitus (HCC) 04/23/2017   Pain from implanted hardware 01/06/2016    Cellulitis and abscess of toe of right foot 07/24/2015   DOE (dyspnea on exertion) 06/16/2015   Chest pain, atypical 06/16/2015   Family history of musculoskeletal disease 03/12/2015   Non-compliant behavior 12/24/2014   Callus of foot 03/25/2014   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   Metatarsal deformity, left 09/03/2013   Pes cavus 10/13/2012   Acquired tight Achilles tendon 10/13/2012   Paronychia 06/14/2012   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) 03/19/2012   Allergic rhinitis 03/19/2012   TTS (tarsal tunnel syndrome)    Pain in joint 01/04/2010   CBC, ABNORMAL 05/01/2009   B12 deficiency 01/05/2009   GOUT 01/05/2009  Anxiety disorder 07/28/2008   TOBACCO USE DISORDER/SMOKER-SMOKING CESSATION DISCUSSED 04/21/2008   FOOT PAIN 04/21/2008   Diabetic neuropathy (HCC) 08/27/2007   LOW BACK PAIN 08/27/2007   Dyslipidemia 08/20/2007   Hypothyroidism 05/22/2007   REFERRING PROVIDER: Nicholes Rough  REFERRING DIAG: LE weakness.  THERAPY DIAG:  pain in right LE  Stiffness of left ankle, not elsewhere classified  Rationale for Evaluation and Treatment: Rehabilitation  ONSET DATE: Ongoing.  SUBJECTIVE:   SUBJECTIVE STATEMENT: Patient reports that she will not be able attend physical therapy after this week as she will be out of time until April.   PERTINENT HISTORY: DM, multiple left foot surgeries, Decubitus ulcer (healed), DOS: 02/27/2023 Procedure: Right posterior tibial tendon lengthening Achilles tendon lengthening and tenodesis of the peroneal tendon PAIN:  Are you having pain? Yes: NPRS scale: 2-3/10. Pain location: Right ankle. Pain description: ache, sharp. Aggravating factors: Being off feet. Relieving factors: Standing and walking.  PRECAUTIONS: Other: Patient in right CAM boot but can be in shoes (ordered diabetic shoes that don't fit properly).   WEIGHT BEARING RESTRICTIONS:  Patient wbat with right CAM boot donned.  She walks some around her  house without boot.  She has diabetic shoes that are not fitting properly.  FALLS:  Has patient fallen in last 6 months? Yes. Number of falls 2-3/10.  Recommended cane usage.  LIVING ENVIRONMENT: Lives in: House/apartment Has following equipment at home: None  OCCUPATION: Disabled.  PLOF: Independent with basic ADLs  PATIENT GOALS: Get rid of right posterior thigh pain that limits her ability to perform her ADL's.  Next MD appointment: 01/10/24  OBJECTIVE:   POSTURE:  In supine:  Left ankle rests in inversion.  Patient states this is much better since surgery.  PALPATION: The patient's CC is pain over her right hamstring muscle group.  She had no specific areas of right ankle pain and no c/o low back, gluteal or SIJ pain.  LOWER EXTREMITY ROM:  Active right ankle dorsiflexion is 3 degrees, plantarflexion is 28 degrees and eversion is 2 degrees.  LOWER EXTREMITY MMT:  Pain with resisted right knee flexion and pain pain in hamstring group with active right knee extension.  GAIT: Antalgic gait with right CAM boot donned.  TUG is 30 seconds.  TODAY'S TREATMENT SESSION:                                   12/13/23 EXERCISE LOG  Exercise Repetitions and Resistance Comments  Nustep  L3 x 20 minutes   Seated hip ADD isometric 3 minutes w/ 5 second hold    LAQ 3 minutes  BLE   Seated heel/toe raises 2 minutes BLE  Seated clams 20 reps   Seated HS curl  10 reps each     Blank cell = exercise not performed today                                    12/08/23 EXERCISE LOG  Exercise Repetitions and Resistance Comments  Nustep  L3 x 15 minutes    LAQ 2 minutes RT LE 2# Partial ROM due to RLE pain  Seated hip ADD isometric  2 x 10 reps w/ 5 second hold    Glute sets          Blank cell = exercise not performed today  STW/M x 13  minutes to patient's right hamstring and ITB with patient in the left sdly Modalities: no redness or adverse reaction to today's modalities  Date:   Unattended Estim: right ITB, pre mod @ 80-150 Hz, 15 mins, Pain and Tone Hot Pack: Hip, 15 mins, Pain and Tone  11/16/23:  Nustep level 2 x 10 minutes f/b STW/M x 13 minutes to patient's right hamstring and ITB with patient in the left sdly position with folded pillow between knees for comfort f/b HMP and HMP and IFC at 80-150 Hz on 40% scan x 20 minutes.  Normal modality response following removal of modality.  PATIENT EDUCATION:  Education details: HEP, plan of care Person educated: Patient Education method: Explanation Education comprehension: verbalized understanding  HOME EXERCISE PROGRAM:   ASSESSMENT:  CLINICAL IMPRESSION: Patient's home exercise program was updated with today's new and familiar interventions due to her upcoming trip. She required minimal cueing with today's new interventions for proper exercise performance. She declined a handout of these interventions. She experienced a moderate increase in right lower extremity pain with seated clams and hamstring curls which limited her ability to complete these interventions. She reported that she was still hurting upon the conclusion of treatment. Her progress with skilled physical therapy will be reassessed at her next appointment.   OBJECTIVE IMPAIRMENTS: Abnormal gait, decreased activity tolerance, difficulty walking, decreased ROM, decreased strength, increased muscle spasms, and pain.   ACTIVITY LIMITATIONS: carrying, lifting, standing, stairs, and locomotion level  PARTICIPATION LIMITATIONS: meal prep, cleaning, laundry, and community activity  PERSONAL FACTORS: Time since onset of injury/illness/exacerbation and 1 comorbidity: multiple right foot/ankle surgeries and long-term use of right CAM boot.  are also affecting patient's functional outcome.   REHAB POTENTIAL: Good  CLINICAL DECISION MAKING: Evolving/moderate complexity  EVALUATION COMPLEXITY: Moderate   GOALS:  LONG TERM GOALS: Target date:  12/25/23.  Ind with a HEP. Goal status: Partially met  2.  Right active dorsiflexion to 6 degrees.  Goal status: On going  3.  Perform ADL's with right posterior thigh pain not > 3/10.  Goal status: On going  4.  Walk with diabetic shoes in clinic 250 feet without antalgic.  Goal status: On going  5.  Perform a reciprocating stair gait with one railing.  Goal status: On going  PLAN:  PT FREQUENCY: 2x/week  PT DURATION: 6 weeks  PLANNED INTERVENTIONS: 97110-Therapeutic exercises, 97530- Therapeutic activity, [id number]- Neuromuscular re-education, 97535- Self Care, [id number]- Manual therapy, [id number]- Electrical stimulation (unattended), 97035- Ultrasound, Patient/Family education, Balance training, Stair training, Cryotherapy, and Moist heat  PLAN FOR NEXT SESSION: Nustep, modalities and STW/M to right hamstrings.  Gait activities.  Seated Rockerboard and dynadisc.   Granville Lewis, PT 12/13/2023, 3:47 PM

## 2023-12-15 ENCOUNTER — Ambulatory Visit: Payer: 59

## 2023-12-15 DIAGNOSIS — M79605 Pain in left leg: Secondary | ICD-10-CM | POA: Diagnosis not present

## 2023-12-15 DIAGNOSIS — M25672 Stiffness of left ankle, not elsewhere classified: Secondary | ICD-10-CM | POA: Diagnosis not present

## 2023-12-15 NOTE — Therapy (Addendum)
 OUTPATIENT PHYSICAL THERAPY LOWER EXTREMITY TREATMENT   Patient Name: JAE BRUCK MRN: [id number] DOB:02-13-1966, 58 y.o., female Today's Date: 12/15/2023  END OF SESSION:  PT End of Session - 12/15/23 1152     Visit Number 7    Number of Visits 12    Date for PT Re-Evaluation 12/25/23    PT Start Time 1150    PT Stop Time 1245    PT Time Calculation (min) 55 min    Activity Tolerance Patient limited by pain    Behavior During Therapy [facility] for tasks assessed/performed               Past Medical History:  Diagnosis Date   Anxiety    Arthritis    hands   B12 deficiency    Chronic constipation    Chronic pain syndrome    neck, lower back, and foot   COPD (chronic obstructive pulmonary disease) (HCC)    Diabetic ulcer of right foot (HCC)    Gout    possible   History of seizure    05-26-2020  per pt as child and last one in late teen's   History of sepsis    due to lower extremity cellulits 2019 left , right 03/ 2021   History of syncope    Hyperlipidemia    Hypothyroidism    Impaired range of motion of cervical spine    per pt hx cervical fusion C4 -- 7,  has to be careful about turning her head to the right can cause her to pass out   Insulin dependent type 2 diabetes mellitus [facility])    endocrinologist--- dr Talmage Nap    (05-26-2020 per pt check's blood sugar twice dialy and at times a third time,  fasting am blood sugar's---- 200 -- 250)   LBP (low back pain)    DR Noel Gerold   Migraine    Peripheral neuropathy    RBBB (right bundle branch block)    Tenosynovitis of foot and ankle 09/03/2013   TTS (tarsal tunnel syndrome) 2010   Left Foot [place] in [place]   Vitamin D deficiency    Wears glasses    Past Surgical History:  Procedure Laterality Date   ANTERIOR CERVICAL DECOMP/DISCECTOMY FUSION  02-12-2004;  07-06-2006   C4--5 in 2005;  C5 --7  in  2007   APPLICATION OF WOUND VAC Right 07/05/2019   Procedure: Application Of Wound Vac;  Surgeon: Park Liter, DPM;   Location: [facility];  Service: Podiatry;  Laterality: Right;   APPLICATION OF WOUND VAC Right 04/10/2022   Procedure: APPLICATION OF WOUND VAC, PLACEMENT OF ANTIBIOTIC BEADS;  Surgeon: Edwin Cap, DPM;  Location: [facility];  Service: Podiatry;  Laterality: Right;   BONE BIOPSY Right 05/29/2020   Procedure: BONE BIOPSY;  Surgeon: Park Liter, DPM;  Location: [facility];  Service: Podiatry;  Laterality: Right;   CARDIAC CATHETERIZATION  05-27-2002   dr Jacinto Halim   for positive cardiolite;  normal coronaries and LVF, ef 70%   CARPAL TUNNEL RELEASE Bilateral left 12-25-2008;  right 09-22-2009  both @[facility]    HARDWARE REMOVAL  01-07-2016  @[facility]    right foot   INCISION AND DRAINAGE OF WOUND Right 07/20/2019   Procedure: IRRIGATION AND DEBRIDEMENT foot;  Surgeon: Park Liter, DPM;  Location: [facility];  Service: Podiatry;  Laterality: Right;   INCISION AND DRAINAGE OF WOUND Right 07/22/2019   Procedure: IRRIGATION AND DEBRIDEMENT RIGHT FOOT; APPLICATION OF WOUND VAC;  Surgeon: Samuella Cota,  Nolon Bussing, DPM;  Location: [facility];  Service: Podiatry;  Laterality: Right;   INCISION AND DRAINAGE OF WOUND Right 07/24/2019   Procedure: IRRIGATION AND DEBRIDEMENT WOUND, APPLICATION OF WOUND VAC;  Surgeon: Park Liter, DPM;  Location: [facility];  Service: Podiatry;  Laterality: Right;   INCISION AND DRAINAGE OF WOUND Right 05/29/2020   Procedure: RIGHT FOOT WOUND DEBRIDEMENT AND IRRIGATION;  Surgeon: Park Liter, DPM;  Location: [facility];  Service: Podiatry;  Laterality: Right;   INCISION AND DRAINAGE OF WOUND Right 04/13/2022   Procedure: IRRIGATION AND DEBRIDEMENT WOUND;  Surgeon: Candelaria Stagers, DPM;  Location: [facility];  Service: Podiatry;  Laterality: Right;   IRRIGATION AND DEBRIDEMENT FOOT Right 04/10/2022   Procedure: IRRIGATION AND DEBRIDEMENT FOOT;  Surgeon: Edwin Cap, DPM;  Location: [facility];  Service: Podiatry;  Laterality: Right;   LUMBAR DISC SURGERY  07-07-2005   @[facility]    L5  - S1   METATARSAL HEAD EXCISION Bilateral 01/02/2019   Procedure: Left foot 5th metatarsal resection, wound excision, adjacent tissue transfer. Right fifth metatarsal head resection, fitfth metatarsal base resection, intermediate wound repair.;  Surgeon: Park Liter, DPM;  Location: [facility];  Service: Podiatry;  Laterality: Bilateral;   METATARSAL HEAD EXCISION Right 05/12/2021   Procedure: Right 5th met base resection;  Surgeon: Park Liter, DPM;  Location: [facility];  Service: Podiatry;  Laterality: Right;   METATARSAL OSTEOTOMY Right 07/05/2019   Procedure: RIGHT FOOT 5TH METATARSAL RESECTION; PERONEAL TENDONESIS, EXCISION OF ULCER, LOCAL TISSUE TRANSFER ;  Surgeon: Park Liter, DPM;  Location: [facility];  Service: Podiatry;  Laterality: Right;   ORIF METATARSAL FRACTURE  09-22-2015  @[facility]    right 5th   TOTAL ABDOMINAL HYSTERECTOMY W/ BILATERAL SALPINGOOPHORECTOMY  2000 approx.   Patient Active Problem List   Diagnosis Date Noted   Cervical radiculopathy 03/08/2023   Cellulitis of right leg 01/23/2023   Osteomyelitis of foot (HCC) 04/08/2022   Finger pain, right 02/23/2022   Depression 02/23/2022   Thyroid nodule 09/09/2021   Statin myopathy 08/06/2021   Essential hypertension 10/09/2020   Pure hypercholesterolemia 10/09/2020   Chronic osteomyelitis involving right ankle and foot (HCC)    Cellulitis of right lower leg 01/10/2020   Cellulitis 01/02/2020   Peroneal tendonitis, right    Right foot ulcer (HCC) 07/04/2019   Capsulitis of metatarsophalangeal (MTP) joint of right foot    Exostosis of bone of foot    Polyneuropathy due to type 2 diabetes mellitus (HCC) 10/01/2018   Chronic pain 09/29/2018   Cellulitis of left lower leg    Cerumen impaction 08/02/2017   Constipation due to pain medication 05/22/2017   AKI (acute kidney injury) (HCC) 05/03/2017   Charcot foot due to diabetes mellitus (HCC) 04/23/2017   Pain from implanted hardware 01/06/2016    Cellulitis and abscess of toe of right foot 07/24/2015   DOE (dyspnea on exertion) 06/16/2015   Chest pain, atypical 06/16/2015   Family history of musculoskeletal disease 03/12/2015   Non-compliant behavior 12/24/2014   Callus of foot 03/25/2014   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   Metatarsal deformity, left 09/03/2013   Pes cavus 10/13/2012   Acquired tight Achilles tendon 10/13/2012   Paronychia 06/14/2012   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) 03/19/2012   Allergic rhinitis 03/19/2012   TTS (tarsal tunnel syndrome)    Pain in joint 01/04/2010   CBC, ABNORMAL 05/01/2009   B12 deficiency 01/05/2009   GOUT 01/05/2009  Anxiety disorder 07/28/2008   TOBACCO USE DISORDER/SMOKER-SMOKING CESSATION DISCUSSED 04/21/2008   FOOT PAIN 04/21/2008   Diabetic neuropathy (HCC) 08/27/2007   LOW BACK PAIN 08/27/2007   Dyslipidemia 08/20/2007   Hypothyroidism 05/22/2007   REFERRING PROVIDER: Nicholes Rough  REFERRING DIAG: LE weakness.  THERAPY DIAG:  pain in right LE  Stiffness of left ankle, not elsewhere classified  Rationale for Evaluation and Treatment: Rehabilitation  ONSET DATE: Ongoing.  SUBJECTIVE:   SUBJECTIVE STATEMENT: Pt reports 7/10 right LE and left calf pain today.  Pt will not be able to return to therapy until April due to vacation and lack of transportation.     PERTINENT HISTORY: DM, multiple left foot surgeries, Decubitus ulcer (healed), DOS: 02/27/2023 Procedure: Right posterior tibial tendon lengthening Achilles tendon lengthening and tenodesis of the peroneal tendon PAIN:  Are you having pain? Yes: NPRS scale: 2-3/10. Pain location: Right ankle. Pain description: ache, sharp. Aggravating factors: Being off feet. Relieving factors: Standing and walking.  PRECAUTIONS: Other: Patient in right CAM boot but can be in shoes (ordered diabetic shoes that don't fit properly).   WEIGHT BEARING RESTRICTIONS:  Patient wbat with right CAM boot donned.   She walks some around her house without boot.  She has diabetic shoes that are not fitting properly.  FALLS:  Has patient fallen in last 6 months? Yes. Number of falls 2-3/10.  Recommended cane usage.  LIVING ENVIRONMENT: Lives in: House/apartment Has following equipment at home: None  OCCUPATION: Disabled.  PLOF: Independent with basic ADLs  PATIENT GOALS: Get rid of right posterior thigh pain that limits her ability to perform her ADL's.  Next MD appointment: 01/10/24  OBJECTIVE:   POSTURE:  In supine:  Left ankle rests in inversion.  Patient states this is much better since surgery.  PALPATION: The patient's CC is pain over her right hamstring muscle group.  She had no specific areas of right ankle pain and no c/o low back, gluteal or SIJ pain.  LOWER EXTREMITY ROM:  Active right ankle dorsiflexion is 3 degrees, plantarflexion is 28 degrees and eversion is 2 degrees.  LOWER EXTREMITY MMT:  Pain with resisted right knee flexion and pain pain in hamstring group with active right knee extension.  GAIT: Antalgic gait with right CAM boot donned.  TUG is 30 seconds.  TODAY'S TREATMENT SESSION:                                   12/15/23 EXERCISE LOG  Exercise Repetitions and Resistance Comments  Nustep  L3 x 20 minutes   Seated hip ADD isometric    LAQ  BLE   Seated heel/toe raises  BLE  Seated clams    Seated HS curl      Blank cell = exercise not performed today                                    12/08/23 EXERCISE LOG  Exercise Repetitions and Resistance Comments  Nustep  L3 x 15 minutes    LAQ 2 minutes RT LE 2# Partial ROM due to RLE pain  Seated hip ADD isometric  2 x 10 reps w/ 5 second hold    Glute sets          Blank cell = exercise not performed today  STW/M x 13 minutes to patient's right hamstring  and ITB with patient in the left sdly Modalities: no redness or adverse reaction to today's modalities  Date:  Unattended Estim: right ITB, pre mod @ 80-150  Hz, 15 mins, Pain and Tone Hot Pack: Hip, 15 mins, Pain and Tone  11/16/23:  Nustep level 2 x 10 minutes f/b STW/M x 13 minutes to patient's right hamstring and ITB with patient in the left sdly position with folded pillow between knees for comfort f/b HMP and HMP and IFC at 80-150 Hz on 40% scan x 20 minutes.  Normal modality response following removal of modality.  PATIENT EDUCATION:  Education details: HEP, plan of care Person educated: Patient Education method: Explanation Education comprehension: verbalized understanding  HOME EXERCISE PROGRAM:   ASSESSMENT:  CLINICAL IMPRESSION: Pt arrives for today's treatment session reporting 7/10 right LE and left calf pain today.  Pt able to demonstrate 14 degrees of right ankle dorsiflexion, meeting her LTG.  Pt able to navigate four stairs with step-to pattern only.  Pt also able to ambulate greater that 250 ft with no significant gait deviations with diabetic shoes donned.  STW/M performed to right IT band to decrease pain and tone.  Normal responses to estim and MH noted upon removal.  Pt reported decreased pain at completion of today's treatment session.  OBJECTIVE IMPAIRMENTS: Abnormal gait, decreased activity tolerance, difficulty walking, decreased ROM, decreased strength, increased muscle spasms, and pain.   ACTIVITY LIMITATIONS: carrying, lifting, standing, stairs, and locomotion level  PARTICIPATION LIMITATIONS: meal prep, cleaning, laundry, and community activity  PERSONAL FACTORS: Time since onset of injury/illness/exacerbation and 1 comorbidity: multiple right foot/ankle surgeries and long-term use of right CAM boot.  are also affecting patient's functional outcome.   REHAB POTENTIAL: Good  CLINICAL DECISION MAKING: Evolving/moderate complexity  EVALUATION COMPLEXITY: Moderate   GOALS:  LONG TERM GOALS: Target date: 12/25/23.  Ind with a HEP. Goal status: MET  2.  Right active dorsiflexion to 6 degrees.   2/14: 14  degrees Goal status: MET  3.  Perform ADL's with right posterior thigh pain not > 3/10.  Goal status: On going  4.  Walk with diabetic shoes in clinic 250 feet without antalgic.  Goal status: MET  5.  Perform a reciprocating stair gait with one railing.   2/14: step-to pattern Goal status: On going  PLAN:  PT FREQUENCY: 2x/week  PT DURATION: 6 weeks  PLANNED INTERVENTIONS: 97110-Therapeutic exercises, 97530- Therapeutic activity, 97112- Neuromuscular re-education, 97535- Self Care, 14782- Manual therapy, 97014- Electrical stimulation (unattended), 97035- Ultrasound, Patient/Family education, Balance training, Stair training, Cryotherapy, and Moist heat  PLAN FOR NEXT SESSION: Nustep, modalities and STW/M to right hamstrings.  Gait activities.  Seated Rockerboard and dynadisc.   Newman Pies, PTA 12/15/2023, 12:59 PM   PHYSICAL THERAPY DISCHARGE SUMMARY  Visits from Start of Care: 7.  Current functional level related to goals / functional outcomes: See above.     Remaining deficits: See goal section.   Education / Equipment: HEP.   Patient agrees to discharge. Patient goals were partially met. Patient is being discharged due to lack of progress.    Italy Applegate MPT

## 2023-12-29 ENCOUNTER — Telehealth: Payer: Self-pay | Admitting: Internal Medicine

## 2023-12-29 NOTE — Telephone Encounter (Signed)
 Copied from CRM [id number]. Topic: Clinical - Prescription Issue >> Dec 29, 2023 11:35 AM Irine Seal wrote: Reason for CRM: oxyCODONE (ROXICODONE) 15 MG immediate release tablet  patient  was originally scheduled for 01/03/24 she just rescheduled for 02/21/2024 since she will be out of town, she stated she needs to see if [facility] will call her in a refill since she will now run out before her appt time.

## 2024-01-01 MED ORDER — OXYCODONE HCL 15 MG PO TABS: 15.0000 mg | ORAL_TABLET | Freq: Four times a day (QID) | ORAL | 0 refills | Status: DC | PRN

## 2024-01-01 NOTE — Telephone Encounter (Signed)
 Okay.  Thanks.

## 2024-01-03 ENCOUNTER — Ambulatory Visit: Payer: 59 | Admitting: Internal Medicine

## 2024-01-03 ENCOUNTER — Ambulatory Visit: Payer: 59

## 2024-01-03 VITALS — Ht 69.0 in | Wt 212.0 lb

## 2024-01-03 DIAGNOSIS — Z1211 Encounter for screening for malignant neoplasm of colon: Secondary | ICD-10-CM

## 2024-01-03 DIAGNOSIS — Z0000 Encounter for general adult medical examination without abnormal findings: Secondary | ICD-10-CM | POA: Diagnosis not present

## 2024-01-03 NOTE — Patient Instructions (Addendum)
 [name] , Thank you for taking time to come for your Medicare Wellness Visit. I appreciate your ongoing commitment to your health goals. Please review the following plan we discussed and let me know if I can assist you in the future.   Referrals/Orders/Follow-Ups/Clinician Recommendations: Aim for 30 minutes of exercise or brisk walking, 6-8 glasses of water, and 5 servings of fruits and vegetables each day. Educated and advised on getting the Influenza vaccine.  Ordered Cologuard kit to be delivered.  Will contact her Gynecologist office to schedule a repeat Mammogram for 2025.  This is a list of the screening recommended for you and due dates:  Health Maintenance  Topic Date Due   Cologuard (Stool DNA test)  Never done   Yearly kidney health urinalysis for diabetes  07/19/2017   Complete foot exam   11/13/2018   COVID-19 Vaccine (3 - Pfizer risk series) 10/26/2020   Hemoglobin A1C  07/26/2023   Yearly kidney function blood test for diabetes  01/25/2024   Flu Shot  01/29/2024*   Mammogram  05/24/2024*   Eye exam for diabetics  04/26/2024   Medicare Annual Wellness Visit  01/02/2025   Pneumococcal Vaccination (3 of 3 - PPSV23 or PCV20) 11/28/2030   DTaP/Tdap/Td vaccine (4 - Td or Tdap) 01/27/2031   Hepatitis C Screening  Completed   HIV Screening  Completed   Zoster (Shingles) Vaccine  Completed   HPV Vaccine  Aged Out  *Topic was postponed. The date shown is not the original due date.    Advanced directives: (Provided) Advance directive discussed with you today. I have provided a copy for you to complete at home and have notarized. Once this is complete, please bring a copy in to our office so we can scan it into your chart.   Next Medicare Annual Wellness Visit scheduled for next year: Yes - 2026   Managing Pain Without Opioids Opioids are strong medicines used to treat moderate to severe pain. For some people, especially those who have long-term (chronic) pain, opioids may not  be the best choice for pain management due to: Side effects like nausea, constipation, and sleepiness. The risk of addiction (opioid use disorder). The longer you take opioids, the greater your risk of addiction. Pain that lasts for more than 3 months is called chronic pain. Managing chronic pain usually requires more than one approach and is often provided by a team of health care providers working together (multidisciplinary approach). Pain management may be done at a pain management center or pain clinic. How to manage pain without the use of opioids Use non-opioid medicines Non-opioid medicines for pain may include: Over-the-counter or prescription non-steroidal anti-inflammatory drugs (NSAIDs). These may be the first medicines used for pain. They work well for muscle and bone pain, and they reduce swelling. Acetaminophen. This over-the-counter medicine may work well for milder pain but not swelling. Antidepressants. These may be used to treat chronic pain. A certain type of antidepressant (tricyclics) is often used. These medicines are given in lower doses for pain than when used for depression. Anticonvulsants. These are usually used to treat seizures but may also reduce nerve (neuropathic) pain. Muscle relaxants. These relieve pain caused by sudden muscle tightening (spasms). You may also use a pain medicine that is applied to the skin as a patch, cream, or gel (topical analgesic), such as a numbing medicine. These may cause fewer side effects than medicines taken by mouth. Do certain therapies as directed Some therapies can help with pain  management. They include: Physical therapy. You will do exercises to gain strength and flexibility. A physical therapist may teach you exercises to move and stretch parts of your body that are weak, stiff, or painful. You can learn these exercises at physical therapy visits and practice them at home. Physical therapy may also involve: Massage. Heat wraps or  applying heat or cold to affected areas. Electrical signals that interrupt pain signals (transcutaneous electrical nerve stimulation, TENS). Weak lasers that reduce pain and swelling (low-level laser therapy). Signals from your body that help you learn to regulate pain (biofeedback). Occupational therapy. This helps you to learn ways to function at home and work with less pain. Recreational therapy. This involves trying new activities or hobbies, such as a physical activity or drawing. Mental health therapy, including: Cognitive behavioral therapy (CBT). This helps you learn coping skills for dealing with pain. Acceptance and commitment therapy (ACT) to change the way you think and react to pain. Relaxation therapies, including muscle relaxation exercises and mindfulness-based stress reduction. Pain management counseling. This may be individual, family, or group counseling.  Receive medical treatments Medical treatments for pain management include: Nerve block injections. These may include a pain blocker and anti-inflammatory medicines. You may have injections: Near the spine to relieve chronic back or neck pain. Into joints to relieve back or joint pain. Into nerve areas that supply a painful area to relieve body pain. Into muscles (trigger point injections) to relieve some painful muscle conditions. A medical device placed near your spine to help block pain signals and relieve nerve pain or chronic back pain (spinal cord stimulation device). Acupuncture. Follow these instructions at home Medicines Take over-the-counter and prescription medicines only as told by your health care provider. If you are taking pain medicine, ask your health care providers about possible side effects to watch out for. Do not drive or use heavy machinery while taking prescription opioid pain medicine. Lifestyle  Do not use drugs or alcohol to reduce pain. If you drink alcohol, limit how much you have to: 0-1  drink a day for women who are not pregnant. 0-2 drinks a day for men. Know how much alcohol is in a drink. In the [place] one drink equals one 12 oz bottle of beer (355 mL), one 5 oz glass of wine (148 mL), or one 1 oz glass of hard liquor (44 mL). Do not use any products that contain nicotine or tobacco. These products include cigarettes, chewing tobacco, and vaping devices, such as e-cigarettes. If you need help quitting, ask your health care provider. Eat a healthy diet and maintain a healthy weight. Poor diet and excess weight may make pain worse. Eat foods that are high in fiber. These include fresh fruits and vegetables, whole grains, and beans. Limit foods that are high in fat and processed sugars, such as fried and sweet foods. Exercise regularly. Exercise lowers stress and may help relieve pain. Ask your health care provider what activities and exercises are safe for you. If your health care provider approves, join an exercise class that combines movement and stress reduction. Examples include yoga and tai chi. Get enough sleep. Lack of sleep may make pain worse. Lower stress as much as possible. Practice stress reduction techniques as told by your therapist. General instructions Work with all your pain management providers to find the treatments that work best for you. You are an important member of your pain management team. There are many things you can do to reduce pain on your  own. Consider joining an online or in-person support group for people who have chronic pain. Keep all follow-up visits. This is important. Where to find more information You can find more information about managing pain without opioids from: American Academy of Pain Medicine: painmed.org Institute for Chronic Pain: instituteforchronicpain.org American Chronic Pain Association: theacpa.org Contact a health care provider if: You have side effects from pain medicine. Your pain gets worse or does not get better  with treatments or home therapy. You are struggling with anxiety or depression. Summary Many types of pain can be managed without opioids. Chronic pain may respond better to pain management without opioids. Pain is best managed when you and a team of health care providers work together. Pain management without opioids may include non-opioid medicines, medical treatments, physical therapy, mental health therapy, and lifestyle changes. Tell your health care providers if your pain gets worse or is not being managed well enough. This information is not intended to replace advice given to you by your health care provider. Make sure you discuss any questions you have with your health care provider. Document Revised: 01/27/2021 Document Reviewed: 01/27/2021 Elsevier Patient Education  2024 [employer].

## 2024-01-03 NOTE — Progress Notes (Addendum)
 Subjective:   Katherine Herrera is a 58 y.o. who presents for a Medicare Wellness preventive visit.  Visit Complete: Virtual I connected with  MCKAILA DUFFUS on 01/03/24 by a audio enabled telemedicine application and verified that I am speaking with the correct person using two identifiers.  Patient Location: Home  Provider Location: Office/Clinic  I discussed the limitations of evaluation and management by telemedicine. The patient expressed understanding and agreed to proceed.  Vital Signs: Because this visit was a virtual/telehealth visit, some criteria may be missing or patient reported. Any vitals not documented were not able to be obtained and vitals that have been documented are patient reported.  VideoDeclined- This patient declined Librarian, academic. Therefore the visit was completed with audio only.  AWV Questionnaire: No: Patient Medicare AWV questionnaire was not completed prior to this visit.  Cardiac Risk Factors include: advanced age (>37men, >71 women);diabetes mellitus;hypertension;dyslipidemia;obesity (BMI >30kg/m2);smoking/ tobacco exposure (current Smoker)     Objective:    Today's Vitals   01/03/24 1305  Weight: 212 lb (96.2 kg)  Height: 5\' 9"  (1.753 m)   Body mass index is 31.31 kg/m.     01/03/2024    1:02 PM 01/23/2023   10:48 AM 01/02/2023    1:05 PM 04/13/2022   11:14 AM 04/10/2022    8:36 AM 12/30/2021    1:23 PM 11/27/2021    3:49 PM  Advanced Directives  Does Patient Have a Medical Advance Directive? No No No No No No No  Would patient like information on creating a medical advance directive? Yes (MAU/Ambulatory/Procedural Areas - Information given) No - Patient declined No - Patient declined No - Patient declined No - Patient declined No - Patient declined No - Patient declined    Current Medications (verified) Outpatient Encounter Medications as of 01/03/2024  Medication Sig   albuterol (VENTOLIN HFA) 108 (90 Base)  MCG/ACT inhaler Inhale 1 puff into the lungs every 6 (six) hours as needed for shortness of breath.   Alcohol Swabs (PHARMACIST CHOICE ALCOHOL) PADS    aspirin 325 MG tablet Take 325 mg by mouth daily.   B-D INS SYR ULTRAFINE 1CC/31G 31G X 5/16" 1 ML MISC USE AS DIRECTED   baclofen (LIORESAL) 10 MG tablet TAKE 2 TABLETS BY MOUTH TWICE  DAILY   bisacodyl (DULCOLAX) 5 MG EC tablet Take 10 mg by mouth at bedtime.   Blood Glucose Monitoring Suppl (ONETOUCH VERIO) w/Device KIT    celecoxib (CELEBREX) 200 MG capsule Take 1 capsule (200 mg total) by mouth 2 (two) times daily as needed. TAKE 1 CAPSULE TWICE DAILY AS NEEDED FOR PAIN WITH FOOD   cholecalciferol (VITAMIN D3) 25 MCG (1000 UNIT) tablet Take 1,000 Units by mouth daily.   clonazePAM (KLONOPIN) 0.5 MG tablet Take 1 tablet (0.5 mg total) by mouth 2 (two) times daily as needed for anxiety.   collagenase (SANTYL) 250 UNIT/GM ointment Apply 1 Application topically daily.   Continuous Blood Gluc Sensor (FREESTYLE LIBRE 2 SENSOR) MISC as directed subcutaneous every 14 days   docusate sodium (COLACE) 100 MG capsule Take 100 mg by mouth at bedtime.   ezetimibe (ZETIA) 10 MG tablet TAKE 1 TABLET BY MOUTH DAILY   FLUoxetine (PROZAC) 10 MG capsule TAKE 1 CAPSULE BY MOUTH EVERY DAY (Patient taking differently: Take 10 mg by mouth daily.)   gabapentin (NEURONTIN) 800 MG tablet TAKE 1 TABLET BY MOUTH 4 TIMES  DAILY   ibuprofen (ADVIL) 800 MG tablet Take 1 tablet (800  mg total) by mouth every 6 (six) hours as needed.   insulin NPH-regular Human (70-30) 100 UNIT/ML injection Inject 20 Units into the skin 2 (two) times daily with a meal. (Patient taking differently: Inject 30-50 Units into the skin See admin instructions. Inject 30 units SQ in the morning and afternoon then inject 50 units SQ at night)   lamoTRIgine (LAMICTAL) 25 MG tablet TAKE 1 TABLET BY MOUTH TWICE  DAILY   Lancet Devices (ACCU-CHEK SOFTCLIX) lancets Test two times daily   Lancets (ONETOUCH  DELICA PLUS LANCET33G) MISC    levothyroxine (SYNTHROID) 75 MCG tablet TAKE 1 TABLET BY MOUTH DAILY  BEFORE BREAKFAST   lisinopril (ZESTRIL) 10 MG tablet TAKE 1 TABLET BY MOUTH DAILY   ondansetron (ZOFRAN) 4 MG tablet Take 4 mg by mouth every 8 (eight) hours as needed for nausea or vomiting.   ONETOUCH VERIO test strip 1 each 3 (three) times daily.   oxyCODONE (ROXICODONE) 15 MG immediate release tablet Take 1 tablet (15 mg total) by mouth every 6 (six) hours as needed for pain.   rOPINIRole (REQUIP) 0.5 MG tablet TAKE 1 TABLET BY MOUTH AT  BEDTIME   sodium hypochlorite (DAKIN'S 1/2 STRENGTH) external solution Irrigate with 1 Application as directed daily.   vitamin B-12 (CYANOCOBALAMIN) 1000 MCG tablet Take 1,000 mcg by mouth daily.   [DISCONTINUED] doxycycline (VIBRA-TABS) 100 MG tablet Take 1 tablet (100 mg total) by mouth 2 (two) times daily.   [DISCONTINUED] methylPREDNISolone (MEDROL DOSEPAK) 4 MG TBPK tablet As directed   No facility-administered encounter medications on file as of 01/03/2024.    Allergies (verified) Cephalexin, Demerol  [meperidine hcl], Lovastatin, Metformin and related, Sulfamethoxazole-trimethoprim, Crestor [rosuvastatin calcium], Hydromorphone, Niacin, Other, Hydromorphone hcl, and Paroxetine   History: Past Medical History:  Diagnosis Date   Anxiety    Arthritis    hands   B12 deficiency    Chronic constipation    Chronic pain syndrome    neck, lower back, and foot   COPD (chronic obstructive pulmonary disease) (HCC)    Diabetic ulcer of right foot (HCC)    Gout    possible   History of seizure    05-26-2020  per pt as child and last one in late teen's   History of sepsis    due to lower extremity cellulits 2019 left , right 03/ 2021   History of syncope    Hyperlipidemia    Hypothyroidism    Impaired range of motion of cervical spine    per pt hx cervical fusion C4 -- 7,  has to be careful about turning her head to the right can cause her to pass  out   Insulin dependent type 2 diabetes mellitus [facility])    endocrinologist--- dr Talmage Nap    (05-26-2020 per pt check's blood sugar twice dialy and at times a third time,  fasting am blood sugar's---- 200 -- 250)   LBP (low back pain)    DR Noel Gerold   Migraine    Peripheral neuropathy    RBBB (right bundle branch block)    Tenosynovitis of foot and ankle 09/03/2013   TTS (tarsal tunnel syndrome) 2010   Left Foot [facility] in [place]   Vitamin D deficiency    Wears glasses    Past Surgical History:  Procedure Laterality Date   ANTERIOR CERVICAL DECOMP/DISCECTOMY FUSION  02-12-2004;  07-06-2006   C4--5 in 2005;  C5 --7  in  2007   APPLICATION OF WOUND VAC Right 07/05/2019   Procedure:  Application Of Wound Vac;  Surgeon: Park Liter, DPM;  Location[facility] OR;  Service: Podiatry;  Laterality: Right;   APPLICATION OF WOUND VAC Right 04/10/2022   Procedure: APPLICATION OF WOUND VAC, PLACEMENT OF ANTIBIOTIC BEADS;  Surgeon: Edwin Cap, DPM;  Location: [facility] OR;  Service: Podiatry;  Laterality: Right;   BONE BIOPSY Right 05/29/2020   Procedure: BONE BIOPSY;  Surgeon: Park Liter, DPM;  Location: [facility];  Service: Podiatry;  Laterality: Right;   CARDIAC CATHETERIZATION  05-27-2002   dr Jacinto Halim   for positive cardiolite;  normal coronaries and LVF, ef 70%   CARPAL TUNNEL RELEASE Bilateral left 12-25-2008;  right 09-22-2009  both @[facility]    HARDWARE REMOVAL  01-07-2016  @[facility]    right foot   INCISION AND DRAINAGE OF WOUND Right 07/20/2019   Procedure: IRRIGATION AND DEBRIDEMENT foot;  Surgeon: Park Liter, DPM;  Location: [facility];  Service: Podiatry;  Laterality: Right;   INCISION AND DRAINAGE OF WOUND Right 07/22/2019   Procedure: IRRIGATION AND DEBRIDEMENT RIGHT FOOT; APPLICATION OF WOUND VAC;  Surgeon: Park Liter, DPM;  Location: [facility];  Service: Podiatry;  Laterality: Right;   INCISION AND DRAINAGE OF WOUND Right 07/24/2019   Procedure: IRRIGATION AND DEBRIDEMENT WOUND,  APPLICATION OF WOUND VAC;  Surgeon: Park Liter, DPM;  Location: [facility];  Service: Podiatry;  Laterality: Right;   INCISION AND DRAINAGE OF WOUND Right 05/29/2020   Procedure: RIGHT FOOT WOUND DEBRIDEMENT AND IRRIGATION;  Surgeon: Park Liter, DPM;  Location: [facility];  Service: Podiatry;  Laterality: Right;   INCISION AND DRAINAGE OF WOUND Right 04/13/2022   Procedure: IRRIGATION AND DEBRIDEMENT WOUND;  Surgeon: Candelaria Stagers, DPM;  Location: [facility] OR;  Service: Podiatry;  Laterality: Right;   IRRIGATION AND DEBRIDEMENT FOOT Right 04/10/2022   Procedure: IRRIGATION AND DEBRIDEMENT FOOT;  Surgeon: Edwin Cap, DPM;  Location: [facility] OR;  Service: Podiatry;  Laterality: Right;   LUMBAR DISC SURGERY  07-07-2005   @[facility]    L5 - S1   METATARSAL HEAD EXCISION Bilateral 01/02/2019   Procedure: Left foot 5th metatarsal resection, wound excision, adjacent tissue transfer. Right fifth metatarsal head resection, fitfth metatarsal base resection, intermediate wound repair.;  Surgeon: Park Liter, DPM;  Location: [facility];  Service: Podiatry;  Laterality: Bilateral;   METATARSAL HEAD EXCISION Right 05/12/2021   Procedure: Right 5th met base resection;  Surgeon: Park Liter, DPM;  Location: [facility];  Service: Podiatry;  Laterality: Right;   METATARSAL OSTEOTOMY Right 07/05/2019   Procedure: RIGHT FOOT 5TH METATARSAL RESECTION; PERONEAL TENDONESIS, EXCISION OF ULCER, LOCAL TISSUE TRANSFER ;  Surgeon: Park Liter, DPM;  Location: [facility] OR;  Service: Podiatry;  Laterality: Right;   ORIF METATARSAL FRACTURE  09-22-2015  @[facility]    right 5th   TOTAL ABDOMINAL HYSTERECTOMY W/ BILATERAL SALPINGOOPHORECTOMY  2000 approx.   Family History  Problem Relation Age of Onset   Diabetes Mother    Hypertension Mother    Cancer Father 15       ? bone cancer   [name] Brother    Social History   Socioeconomic History   Marital status: Legally Separated    Spouse name:  Not on file   Number of children: Not on file   Years of education: Not on file   Highest education level: Not on file  Occupational History   Not on file  Tobacco Use   Smoking status: Every Day  Current packs/day: 1.00    Average packs/day: 1 pack/day for 10.0 years (10.0 ttl pk-yrs)    Types: Cigarettes    Passive exposure: Current   Smokeless tobacco: Never   Tobacco comments:    Trying to quit, smoking 1 ppd  Vaping Use   Vaping status: Never Used  Substance and Sexual Activity   Alcohol use: Yes    Alcohol/week: 1.0 standard drink of alcohol    Types: 1 Glasses of wine per week    Comment: occasionally; for holidays   Drug use: No   Sexual activity: Never  Other Topics Concern   Not on file  Social History Narrative   GYN - Dr Arelia Sneddon      FAMILY HISTORY   Lung cancer   Mother w/alcoholism      Married 3d GS due Nov   Diet Coke: 10 cans a day      Regular exercise - NO      Former smoker 2009 restared 2010 stopped 12/2009   Social Drivers of [employer] Resource Strain: Low Risk  (01/03/2024)   Overall Financial Resource Strain (CARDIA)    Difficulty of Paying Living Expenses: Not hard at all  Food Insecurity: No Food Insecurity (01/03/2024)   Hunger Vital Sign    Worried About Running Out of Food in the Last Year: Never true    Ran Out of Food in the Last Year: Never true  Transportation Needs: No Transportation Needs (01/03/2024)   PRAPARE - Administrator, Civil Service (Medical): No    Lack of Transportation (Non-Medical): No  Physical Activity: Insufficiently Active (01/03/2024)   Exercise Vital Sign    Days of Exercise per Week: 2 days    Minutes of Exercise per Session: 40 min  Stress: No Stress Concern Present (01/03/2024)   [employer] of Occupational Health - Occupational Stress Questionnaire    Feeling of Stress : Not at all  Social Connections: Unknown (01/03/2024)   Social Connection and Isolation Panel [NHANES]    Frequency  of Communication with Friends and Family: More than three times a week    Frequency of Social Gatherings with Friends and Family: More than three times a week    Attends Religious Services: Not on Marketing executive or Organizations: No    Attends Banker Meetings: Never    Marital Status: Divorced    Tobacco Counseling - Current Smoker Ready to quit: No Counseling given: Not Answered Tobacco comments: Trying to quit, smoking 1 ppd    Clinical Intake:  Pre-visit preparation completed: Yes  Pain : No/denies pain     BMI - recorded: 31.31 Nutritional Risks: None Diabetes: Yes CBG done?: Yes (results - 222 Fasting this morning) CBG resulted in Enter/ Edit results?: Yes (results - 222 Fasting this morning) Did pt. bring in CBG monitor from home?: No  How often do you need to have someone help you when you read instructions, pamphlets, or other written materials from your doctor or pharmacy?: 1 - Never  Interpreter Needed?: No  Information entered by :: Hassell Halim, CMA   Activities of Daily Living     01/03/2024    1:09 PM 01/23/2023   10:48 AM  In your present state of health, do you have any difficulty performing the following activities:  Hearing? 0 0  Vision? 0 0  Difficulty concentrating or making decisions? 0 0  Walking or climbing stairs? 0 1  Dressing  or bathing? 0 0  Doing errands, shopping? 0 1  Preparing Food and eating ? N   Using the Toilet? N   In the past six months, have you accidently leaked urine? N   Do you have problems with loss of bowel control? N   Managing your Medications? N   Managing your Finances? N   Housekeeping or managing your Housekeeping? N     Patient Care Team: Plotnikov, Georgina Quint, MD as PCP - General Richardean Chimera, MD as Consulting Physician (Obstetrics and Gynecology) Dorisann Frames, MD as Referring Physician (Endocrinology) Candelaria Stagers, DPM as Consulting Physician (Podiatry)  Indicate any  recent Medical Services you may have received from other than [facility] providers in the past year (date may be approximate).     Assessment:   This is a routine wellness examination for Viha.  Hearing/Vision screen Hearing Screening - Comments:: Denies hearing difficulties   Vision Screening - Comments:: Wears rx glasses - up to date with routine eye exams with [facility] in [place], [place]   Goals Addressed               This Visit's Progress     Patient Stated (pt-stated)        Patient stated that she plans to quit smoking and work on diet to monitor Hgb A1C level.       Depression Screen     01/03/2024    1:14 PM 10/04/2023    3:06 PM 03/08/2023    3:32 PM 01/02/2023    1:05 PM 11/16/2022    3:28 PM 02/23/2022    2:58 PM 12/30/2021    1:12 PM  PHQ 2/9 Scores  PHQ - 2 Score 0 0 0 0 0 2 2  PHQ- 9 Score 0     7     Fall Risk     01/03/2024    1:10 PM 10/04/2023    3:05 PM 03/08/2023    3:32 PM 01/02/2023    1:05 PM 11/16/2022    3:28 PM  Fall Risk   Falls in the past year? 1 0 0 0 0  Number falls in past yr: 0 0 0 0 0  Injury with Fall? 0 0 0 0 0  Risk for fall due to :  No Fall Risks No Fall Risks No Fall Risks No Fall Risks  Follow up Falls evaluation completed;Falls prevention discussed Falls evaluation completed Falls evaluation completed Falls prevention discussed Falls evaluation completed    MEDICARE RISK AT HOME:  Medicare Risk at Home Any stairs in or around the home?: No If so, are there any without handrails?: No Home free of loose throw rugs in walkways, pet beds, electrical cords, etc?: Yes Adequate lighting in your home to reduce risk of falls?: Yes Life alert?: No Use of a cane, walker or w/c?: Yes (cane - use as needed) Grab bars in the bathroom?: Yes Shower chair or bench in shower?: Yes Elevated toilet seat or a handicapped toilet?: No  TIMED UP AND GO:  Was the test performed?  No  Cognitive Function: 6CIT completed        01/03/2024    1:11  PM 01/02/2023    1:07 PM  6CIT Screen  What Year? 0 points 0 points  What month? 0 points 0 points  What time? 0 points 0 points  Count back from 20 0 points 0 points  Months in reverse 0 points 0 points  Repeat phrase 0 points  0 points  Total Score 0 points 0 points    Immunizations Immunization History  Administered Date(s) Administered   DTaP 06/15/2011   Influenza, Quadrivalent, Recombinant, Inj, Pf 07/31/2018   Influenza,inj,Quad PF,6+ Mos 08/12/2015, 07/19/2016, 07/01/2019, 10/21/2020, 08/11/2021, 07/11/2022   PFIZER(Purple Top)SARS-COV-2 Vaccination 07/15/2020, 09/28/2020   Pneumococcal Conjugate-13 02/17/2017   Pneumococcal Polysaccharide-23 01/26/2021   Td 10/31/2008   Tdap 01/26/2021   Zoster Recombinant(Shingrix) 11/05/2021, 03/07/2022    Screening Tests Health Maintenance  Topic Date Due   Fecal DNA (Cologuard)  Never done   Diabetic kidney evaluation - Urine ACR  07/19/2017   FOOT EXAM  11/13/2018   COVID-19 Vaccine (3 - Pfizer risk series) 10/26/2020   HEMOGLOBIN A1C  07/26/2023   Diabetic kidney evaluation - eGFR measurement  01/25/2024   INFLUENZA VACCINE  01/29/2024 (Originally 06/01/2023)   MAMMOGRAM  05/24/2024 (Originally 01/14/2017)   OPHTHALMOLOGY EXAM  04/26/2024   Medicare Annual Wellness (AWV)  01/02/2025   Pneumococcal Vaccine 37-14 Years old (3 of 3 - PPSV23 or PCV20) 11/28/2030   DTaP/Tdap/Td (4 - Td or Tdap) 01/27/2031   Hepatitis C Screening  Completed   HIV Screening  Completed   Zoster Vaccines- Shingrix  Completed   HPV VACCINES  Aged Out    Health Maintenance  Health Maintenance Due  Topic Date Due   Fecal DNA (Cologuard)  Never done   Diabetic kidney evaluation - Urine ACR  07/19/2017   FOOT EXAM  11/13/2018   COVID-19 Vaccine (3 - Pfizer risk series) 10/26/2020   HEMOGLOBIN A1C  07/26/2023   Diabetic kidney evaluation - eGFR measurement  01/25/2024   Health Maintenance Items Addressed:01/03/2024 Cologuard Ordered on  01/03/2024  Mammogram status - Per pt completed in 2024 by her Gynecologist and will contact office to schedule 2025 Mammogram test.  Additional Screening:  Vision Screening: Recommended annual ophthalmology exams for early detection of glaucoma and other disorders of the eye. Pt stated does have an annual eye exam by the [facility] in [place], [place].  Dental Screening: Recommended annual dental exams for proper oral hygiene  Community Resource Referral / Chronic Care Management: CRR required this visit?  No   CCM required this visit?  No     Plan:     I have personally reviewed and noted the following in the patient's chart:   Medical and social history Use of alcohol, tobacco or illicit drugs  Current medications and supplements including opioid prescriptions. Patient is currently taking opioid prescriptions. Information provided to patient regarding non-opioid alternatives. Patient advised to discuss non-opioid treatment plan with their provider. Functional ability and status Nutritional status Physical activity Advanced directives List of other physicians Hospitalizations, surgeries, and ER visits in previous 12 months Vitals Screenings to include cognitive, depression, and falls Referrals and appointments  In addition, I have reviewed and discussed with patient certain preventive protocols, quality metrics, and best practice recommendations. A written personalized care plan for preventive services as well as general preventive health recommendations were provided to patient.     Darreld Mclean, CMA   01/03/2024   After Visit Summary: (MyChart) Due to this being a telephonic visit, the after visit summary with patients personalized plan was offered to patient via MyChart   Notes: Please refer to Routing Comments.  Medical screening examination/treatment/procedure(s) were performed by non-physician practitioner and as supervising physician I was immediately available  for consultation/collaboration.  I agree with above. Jacinta Shoe, MD

## 2024-01-07 DIAGNOSIS — E1165 Type 2 diabetes mellitus with hyperglycemia: Secondary | ICD-10-CM | POA: Diagnosis not present

## 2024-01-08 ENCOUNTER — Other Ambulatory Visit: Payer: Self-pay | Admitting: Internal Medicine

## 2024-01-10 ENCOUNTER — Ambulatory Visit: Payer: 59 | Admitting: Podiatry

## 2024-01-10 ENCOUNTER — Other Ambulatory Visit: Payer: Self-pay | Admitting: Internal Medicine

## 2024-01-28 DIAGNOSIS — L03114 Cellulitis of left upper limb: Secondary | ICD-10-CM | POA: Diagnosis not present

## 2024-01-28 DIAGNOSIS — L923 Foreign body granuloma of the skin and subcutaneous tissue: Secondary | ICD-10-CM | POA: Diagnosis not present

## 2024-02-07 ENCOUNTER — Ambulatory Visit (INDEPENDENT_AMBULATORY_CARE_PROVIDER_SITE_OTHER): Payer: 59 | Admitting: Podiatry

## 2024-02-07 ENCOUNTER — Ambulatory Visit (INDEPENDENT_AMBULATORY_CARE_PROVIDER_SITE_OTHER)

## 2024-02-07 DIAGNOSIS — E11621 Type 2 diabetes mellitus with foot ulcer: Secondary | ICD-10-CM

## 2024-02-07 DIAGNOSIS — M19071 Primary osteoarthritis, right ankle and foot: Secondary | ICD-10-CM

## 2024-02-07 DIAGNOSIS — M2031 Hallux varus (acquired), right foot: Secondary | ICD-10-CM

## 2024-02-07 DIAGNOSIS — L97411 Non-pressure chronic ulcer of right heel and midfoot limited to breakdown of skin: Secondary | ICD-10-CM | POA: Diagnosis not present

## 2024-02-07 DIAGNOSIS — E1165 Type 2 diabetes mellitus with hyperglycemia: Secondary | ICD-10-CM | POA: Diagnosis not present

## 2024-02-07 DIAGNOSIS — Z01818 Encounter for other preprocedural examination: Secondary | ICD-10-CM | POA: Diagnosis not present

## 2024-02-07 NOTE — Progress Notes (Signed)
 Subjective:  Patient ID: Katherine Herrera, female    DOB: Feb 23, 1966,  MRN: [id number]  Chief Complaint  Patient presents with   Foot Ulcer    58 y.o. female presents with the above complaint.  Patient presents with right hallux IPJ contracture/arthritis.  Patient states is painful to touch is progressing and worse hurts with ambulation is required she is a diabetic she is putting pressure spot on the distal tip of the hallux.  She wanted to discuss surgical options at this time she has tried shoe gear modification padding offloading protecting none of which has helped.  She denies any other acute issues.   Review of Systems: Negative except as noted in the HPI. Denies N/V/F/Ch.  Past Medical History:  Diagnosis Date   Anxiety    Arthritis    hands   B12 deficiency    Chronic constipation    Chronic pain syndrome    neck, lower back, and foot   COPD (chronic obstructive pulmonary disease) (HCC)    Diabetic ulcer of right foot (HCC)    Gout    possible   History of seizure    05-26-2020  per pt as child and last one in late teen's   History of sepsis    due to lower extremity cellulits 2019 left , right 03/ 2021   History of syncope    Hyperlipidemia    Hypothyroidism    Impaired range of motion of cervical spine    per pt hx cervical fusion C4 -- 7,  has to be careful about turning her head to the right can cause her to pass out   Insulin dependent type 2 diabetes mellitus [facility])    endocrinologist--- dr Ronelle Coffee    (05-26-2020 per pt check's blood sugar twice dialy and at times a third time,  fasting am blood sugar's---- 200 -- 250)   LBP (low back pain)    DR Faylene Hoots   Migraine    Peripheral neuropathy    RBBB (right bundle branch block)    Tenosynovitis of foot and ankle 09/03/2013   TTS (tarsal tunnel syndrome) 2010   Left Foot [facility] in [place]   Vitamin D deficiency    Wears glasses     Current Outpatient Medications:    albuterol (VENTOLIN HFA) 108 (90 Base) MCG/ACT  inhaler, Inhale 1 puff into the lungs every 6 (six) hours as needed for shortness of breath., Disp: , Rfl:    Alcohol Swabs (PHARMACIST CHOICE ALCOHOL) PADS, , Disp: , Rfl:    aspirin 325 MG tablet, Take 325 mg by mouth daily., Disp: , Rfl:    B-D INS SYR ULTRAFINE 1CC/31G 31G X 5/16" 1 ML MISC, USE AS DIRECTED, Disp: 100 each, Rfl: 0   baclofen (LIORESAL) 10 MG tablet, TAKE 2 TABLETS BY MOUTH TWICE  DAILY, Disp: 180 tablet, Rfl: 1   bisacodyl (DULCOLAX) 5 MG EC tablet, Take 10 mg by mouth at bedtime., Disp: , Rfl:    Blood Glucose Monitoring Suppl (ONETOUCH VERIO) w/Device KIT, , Disp: , Rfl:    celecoxib (CELEBREX) 200 MG capsule, Take 1 capsule (200 mg total) by mouth 2 (two) times daily as needed. TAKE 1 CAPSULE TWICE DAILY AS NEEDED FOR PAIN WITH FOOD, Disp: 60 capsule, Rfl: 0   cholecalciferol (VITAMIN D3) 25 MCG (1000 UNIT) tablet, Take 1,000 Units by mouth daily., Disp: , Rfl:    clonazePAM (KLONOPIN) 0.5 MG tablet, Take 1 tablet (0.5 mg total) by mouth 2 (two) times daily as  needed for anxiety., Disp: 60 tablet, Rfl: 1   collagenase (SANTYL) 250 UNIT/GM ointment, Apply 1 Application topically daily., Disp: 15 g, Rfl: 0   Continuous Blood Gluc Sensor (FREESTYLE LIBRE 2 SENSOR) MISC, as directed subcutaneous every 14 days, Disp: , Rfl:    docusate sodium (COLACE) 100 MG capsule, Take 100 mg by mouth at bedtime., Disp: , Rfl:    ezetimibe (ZETIA) 10 MG tablet, TAKE 1 TABLET BY MOUTH DAILY, Disp: 100 tablet, Rfl: 2   FLUoxetine (PROZAC) 10 MG capsule, TAKE 1 CAPSULE BY MOUTH EVERY DAY (Patient taking differently: Take 10 mg by mouth daily.), Disp: 90 capsule, Rfl: 2   gabapentin (NEURONTIN) 800 MG tablet, TAKE 1 TABLET BY MOUTH 4 TIMES  DAILY, Disp: 400 tablet, Rfl: 2   ibuprofen (ADVIL) 800 MG tablet, Take 1 tablet (800 mg total) by mouth every 6 (six) hours as needed., Disp: 60 tablet, Rfl: 1   insulin NPH-regular Human (70-30) 100 UNIT/ML injection, Inject 20 Units into the skin 2 (two)  times daily with a meal. (Patient taking differently: Inject 30-50 Units into the skin See admin instructions. Inject 30 units SQ in the morning and afternoon then inject 50 units SQ at night), Disp: 10 mL, Rfl: 11   lamoTRIgine (LAMICTAL) 25 MG tablet, TAKE 1 TABLET BY MOUTH TWICE  DAILY, Disp: 200 tablet, Rfl: 2   Lancet Devices (ACCU-CHEK SOFTCLIX) lancets, Test two times daily, Disp: 100 each, Rfl: 12   Lancets (ONETOUCH DELICA PLUS LANCET33G) MISC, , Disp: , Rfl:    levothyroxine (SYNTHROID) 75 MCG tablet, TAKE 1 TABLET BY MOUTH DAILY  BEFORE BREAKFAST, Disp: 100 tablet, Rfl: 2   lisinopril (ZESTRIL) 10 MG tablet, TAKE 1 TABLET BY MOUTH DAILY, Disp: 90 tablet, Rfl: 3   ondansetron (ZOFRAN) 4 MG tablet, Take 4 mg by mouth every 8 (eight) hours as needed for nausea or vomiting., Disp: , Rfl:    ONETOUCH VERIO test strip, 1 each 3 (three) times daily., Disp: , Rfl:    oxyCODONE (ROXICODONE) 15 MG immediate release tablet, Take 1 tablet (15 mg total) by mouth every 6 (six) hours as needed for pain., Disp: 120 tablet, Rfl: 0   rOPINIRole (REQUIP) 0.5 MG tablet, TAKE 1 TABLET BY MOUTH AT  BEDTIME, Disp: 100 tablet, Rfl: 2   sodium hypochlorite (DAKIN'S 1/2 STRENGTH) external solution, Irrigate with 1 Application as directed daily., Disp: 437 mL, Rfl: 0   vitamin B-12 (CYANOCOBALAMIN) 1000 MCG tablet, Take 1,000 mcg by mouth daily., Disp: , Rfl:   Social History   Tobacco Use  Smoking Status Every Day   Current packs/day: 1.00   Average packs/day: 1 pack/day for 10.0 years (10.0 ttl pk-yrs)   Types: Cigarettes   Passive exposure: Current  Smokeless Tobacco Never  Tobacco Comments   Trying to quit, smoking 1 ppd    Allergies  Allergen Reactions   Cephalexin Nausea And Vomiting   Demerol  [Meperidine Hcl] Rash   Lovastatin Other (See Comments)    Possible myalgia    Metformin And Related Nausea And Vomiting   Sulfamethoxazole-Trimethoprim     Other reaction(s): Unknown DILI,  pancreatitis   Crestor [Rosuvastatin Calcium]     myalgia   Hydromorphone     Other reaction(s): Unknown   Niacin Other (See Comments)    Unknown    Other Other (See Comments)   Hydromorphone Hcl Itching    Patient has been tolerating Hydromorphone tablets without adverse effect (07/09/19) Other reaction(s): Unknown   Paroxetine  Other (See Comments)    Unknown    Objective:  There were no vitals filed for this visit. There is no height or weight on file to calculate BMI. Constitutional Well developed. Well nourished.  Vascular Dorsalis pedis pulses palpable bilaterally. Posterior tibial pulses palpable bilaterally. Capillary refill normal to all digits.  No cyanosis or clubbing noted. Pedal hair growth normal.  Neurologic Normal speech. Oriented to person, place, and time. Epicritic sensation to light touch grossly present bilaterally.  Dermatologic Nails well groomed and normal in appearance. No open wounds. No skin lesions.  Orthopedic: Right hallux malleus noted with hallux contracture.  Limited breakdown of the skin noted to the distal tip of the hallux.  No gross wound noted.  No signs of infection noted.  Crepitus noted at the hallux IPJ   Radiographs: Follow-up 3 views of skeletally mature right foot: Contracture noted of the right hallux IPJ.  Arthritis noted of the right hallux IPJ.  No other bony abnormalities identified. Assessment:   1. Hallux malleus of right foot   2. Degenerative arthritis of toe joint, right    Plan:  Patient was evaluated and treated and all questions answered.  Right hallux malleus with underlying IPJ arthritis - All questions or concerns were discussed with the patient extensive detail given the amount of pain that she is having in the setting of failing all conservative care listed above patient will benefit from right hallux IPJ fusion to help reduce the deformity and therefore the pressure sore/pain.  I discussed my preoperative IntraOp  postoperative plan with the patient in extensive detail she states understanding would like to proceed with surgery -Informed surgical risk consent was reviewed and read aloud to the patient.  I reviewed the films.  I have discussed my findings with the patient in great detail.  I have discussed all risks including but not limited to infection, stiffness, scarring, limp, disability, deformity, damage to blood vessels and nerves, numbness, poor healing, need for braces, arthritis, chronic pain, amputation, death.  All benefits and realistic expectations discussed in great detail.  I have made no promises as to the outcome.  I have provided realistic expectations.  I have offered the patient a 2nd opinion, which they have declined and assured me they preferred to proceed despite the risks - I discussed with the patient that she is at high risk of undergoing amputation if not resolved.  No follow-ups on file.

## 2024-02-08 ENCOUNTER — Telehealth: Payer: Self-pay | Admitting: Podiatry

## 2024-02-08 NOTE — Telephone Encounter (Signed)
 DOS: 02/26/24  ARTHRODESIS INTERPHAL JOINT (RT)    EFFECTIVE DATE: 11/01/23  DEDUCTIBLE:  $257.00   REMAINING: $0.00  OOP:  $9,350.00  REMAINING:  $8,997.52  CO INSURANCE : 20%  PER THE [employer] PORTAL NO AUTH IS REQ FOR CPT CODE 40981  REF #: [id number]

## 2024-02-21 ENCOUNTER — Ambulatory Visit (INDEPENDENT_AMBULATORY_CARE_PROVIDER_SITE_OTHER): Payer: 59 | Admitting: Internal Medicine

## 2024-02-21 ENCOUNTER — Encounter: Payer: Self-pay | Admitting: Internal Medicine

## 2024-02-21 VITALS — BP 132/68 | HR 87 | Temp 98.1°F | Ht 69.0 in | Wt 209.2 lb

## 2024-02-21 DIAGNOSIS — F419 Anxiety disorder, unspecified: Secondary | ICD-10-CM | POA: Diagnosis not present

## 2024-02-21 DIAGNOSIS — M5440 Lumbago with sciatica, unspecified side: Secondary | ICD-10-CM | POA: Diagnosis not present

## 2024-02-21 DIAGNOSIS — E538 Deficiency of other specified B group vitamins: Secondary | ICD-10-CM

## 2024-02-21 DIAGNOSIS — G8929 Other chronic pain: Secondary | ICD-10-CM | POA: Diagnosis not present

## 2024-02-21 DIAGNOSIS — E1140 Type 2 diabetes mellitus with diabetic neuropathy, unspecified: Secondary | ICD-10-CM | POA: Diagnosis not present

## 2024-02-21 MED ORDER — CLONAZEPAM 0.5 MG PO TABS: 0.5000 mg | ORAL_TABLET | Freq: Two times a day (BID) | ORAL | 1 refills | Status: DC | PRN

## 2024-02-21 MED ORDER — OXYCODONE HCL 15 MG PO TABS: 15.0000 mg | ORAL_TABLET | Freq: Four times a day (QID) | ORAL | 0 refills | Status: DC | PRN

## 2024-02-21 MED ORDER — PSEUDOEPHEDRINE HCL ER 120 MG PO TB12: 120.0000 mg | ORAL_TABLET | Freq: Two times a day (BID) | ORAL | 3 refills | Status: AC | PRN

## 2024-02-21 NOTE — Assessment & Plan Note (Signed)
 On Oxycodone  15 mg qid prn  Potential benefits of a long term opioids use as well as potential risks (i.e. addiction risk, apnea etc) and complications (i.e. Somnolence, constipation and others) were explained to the patient and were aknowledged.  B feet are in a surgical shoe

## 2024-02-21 NOTE — Assessment & Plan Note (Signed)
Chronic Occ panic attacks Cont on Clonazepam prn  Potential benefits of a long term benzodiazepines  use as well as potential risks  and complications were explained to the patient and were aknowledged.

## 2024-02-21 NOTE — Assessment & Plan Note (Signed)
 On B12

## 2024-02-21 NOTE — Progress Notes (Signed)
 Subjective:  Patient ID: Katherine Herrera, female    DOB: 02-06-1966  Age: 58 y.o. MRN: [id number]  CC: Medical Management of Chronic Issues (3 Month Follow Up. Patient noted that they are having a surgery on their big toe this upcoming Monday) and Sinusitis (Patient notes sinus pain/pressure for several days)   HPI JILENE SPOHR presents for allergies, DM, anxiety F/u on chronic pain  Outpatient Medications Prior to Visit  Medication Sig Dispense Refill  . albuterol  (VENTOLIN  HFA) 108 (90 Base) MCG/ACT inhaler Inhale 1 puff into the lungs every 6 (six) hours as needed for shortness of breath.    . Alcohol Swabs (PHARMACIST CHOICE ALCOHOL) PADS     . aspirin  325 MG tablet Take 325 mg by mouth daily.    . B-D INS SYR ULTRAFINE 1CC/31G 31G X 5/16" 1 ML MISC USE AS DIRECTED 100 each 0  . baclofen  (LIORESAL ) 10 MG tablet TAKE 2 TABLETS BY MOUTH TWICE  DAILY 180 tablet 1  . bisacodyl  (DULCOLAX) 5 MG EC tablet Take 10 mg by mouth at bedtime.    . Blood Glucose Monitoring Suppl (ONETOUCH VERIO) w/Device KIT     . celecoxib  (CELEBREX ) 200 MG capsule Take 1 capsule (200 mg total) by mouth 2 (two) times daily as needed. TAKE 1 CAPSULE TWICE DAILY AS NEEDED FOR PAIN WITH FOOD 60 capsule 0  . cholecalciferol  (VITAMIN D3) 25 MCG (1000 UNIT) tablet Take 1,000 Units by mouth daily.    . collagenase  (SANTYL ) 250 UNIT/GM ointment Apply 1 Application topically daily. 15 g 0  . Continuous Blood Gluc Sensor (FREESTYLE LIBRE 2 SENSOR) MISC as directed subcutaneous every 14 days    . docusate sodium  (COLACE) 100 MG capsule Take 100 mg by mouth at bedtime.    . ezetimibe  (ZETIA ) 10 MG tablet TAKE 1 TABLET BY MOUTH DAILY 100 tablet 2  . FLUoxetine  (PROZAC ) 10 MG capsule TAKE 1 CAPSULE BY MOUTH EVERY DAY (Patient taking differently: Take 10 mg by mouth daily.) 90 capsule 2  . gabapentin  (NEURONTIN ) 800 MG tablet TAKE 1 TABLET BY MOUTH 4 TIMES  DAILY 400 tablet 2  . ibuprofen  (ADVIL ) 800 MG tablet Take 1 tablet  (800 mg total) by mouth every 6 (six) hours as needed. 60 tablet 1  . insulin  NPH-regular Human (70-30) 100 UNIT/ML injection Inject 20 Units into the skin 2 (two) times daily with a meal. (Patient taking differently: Inject 30-50 Units into the skin See admin instructions. Inject 30 units SQ in the morning and afternoon then inject 50 units SQ at night) 10 mL 11  . lamoTRIgine  (LAMICTAL ) 25 MG tablet TAKE 1 TABLET BY MOUTH TWICE  DAILY 200 tablet 2  . Lancet Devices (ACCU-CHEK SOFTCLIX) lancets Test two times daily 100 each 12  . Lancets (ONETOUCH DELICA PLUS LANCET33G) MISC     . levothyroxine  (SYNTHROID ) 75 MCG tablet TAKE 1 TABLET BY MOUTH DAILY  BEFORE BREAKFAST 100 tablet 2  . lisinopril  (ZESTRIL ) 10 MG tablet TAKE 1 TABLET BY MOUTH DAILY 90 tablet 3  . ondansetron  (ZOFRAN ) 4 MG tablet Take 4 mg by mouth every 8 (eight) hours as needed for nausea or vomiting.    Emmett Harman VERIO test strip 1 each 3 (three) times daily.    . rOPINIRole  (REQUIP ) 0.5 MG tablet TAKE 1 TABLET BY MOUTH AT  BEDTIME 100 tablet 2  . sodium hypochlorite (DAKIN'S 1/2 STRENGTH) external solution Irrigate with 1 Application as directed daily. 437 mL 0  . vitamin  B-12 (CYANOCOBALAMIN ) 1000 MCG tablet Take 1,000 mcg by mouth daily.    . clonazePAM  (KLONOPIN ) 0.5 MG tablet Take 1 tablet (0.5 mg total) by mouth 2 (two) times daily as needed for anxiety. 60 tablet 1  . oxyCODONE  (ROXICODONE ) 15 MG immediate release tablet Take 1 tablet (15 mg total) by mouth every 6 (six) hours as needed for pain. 120 tablet 0   No facility-administered medications prior to visit.    ROS: Review of Systems  Constitutional:  Negative for activity change, appetite change, chills, fatigue and unexpected weight change.  HENT:  Negative for congestion, mouth sores and sinus pressure.   Eyes:  Negative for visual disturbance.  Respiratory:  Negative for cough and chest tightness.   Gastrointestinal:  Negative for abdominal pain and nausea.   Genitourinary:  Negative for difficulty urinating, frequency and vaginal pain.  Musculoskeletal:  Positive for back pain. Negative for gait problem.  Skin:  Negative for pallor and rash.  Neurological:  Negative for dizziness, tremors, weakness, numbness and headaches.  Psychiatric/Behavioral:  Negative for confusion, sleep disturbance and suicidal ideas. The patient is nervous/anxious.     Objective:  BP 132/68   Pulse 87   Temp 98.1 F (36.7 C)   Ht 5\' 9"  (1.753 m)   Wt 209 lb 3.2 oz (94.9 kg)   SpO2 98%   BMI 30.89 kg/m   BP Readings from Last 3 Encounters:  02/21/24 132/68  10/04/23 120/62  06/07/23 110/60    Wt Readings from Last 3 Encounters:  02/21/24 209 lb 3.2 oz (94.9 kg)  01/03/24 212 lb (96.2 kg)  10/04/23 212 lb (96.2 kg)    Physical Exam Constitutional:      General: She is not in acute distress.    Appearance: She is well-developed. She is obese.  HENT:     Head: Normocephalic.     Right Ear: External ear normal.     Left Ear: External ear normal.     Nose: Nose normal.  Eyes:     General:        Right eye: No discharge.        Left eye: No discharge.     Conjunctiva/sclera: Conjunctivae normal.     Pupils: Pupils are equal, round, and reactive to light.  Neck:     Thyroid : No thyromegaly.     Vascular: No JVD.     Trachea: No tracheal deviation.  Cardiovascular:     Rate and Rhythm: Normal rate and regular rhythm.     Heart sounds: Normal heart sounds.  Pulmonary:     Effort: No respiratory distress.     Breath sounds: No stridor. No wheezing.  Abdominal:     General: Bowel sounds are normal. There is no distension.     Palpations: Abdomen is soft. There is no mass.     Tenderness: There is no abdominal tenderness. There is no guarding or rebound.  Musculoskeletal:        General: No tenderness.     Cervical back: Normal range of motion and neck supple. No rigidity.  Lymphadenopathy:     Cervical: No cervical adenopathy.  Skin:     Findings: No erythema or rash.  Neurological:     Mental Status: She is oriented to person, place, and time.     Cranial Nerves: No cranial nerve deficit.     Motor: No abnormal muscle tone.     Coordination: Coordination normal.     Deep Tendon Reflexes: Reflexes  normal.  Psychiatric:        Behavior: Behavior normal.        Thought Content: Thought content normal.        Judgment: Judgment normal.    Lab Results  Component Value Date   WBC 8.1 01/25/2023   HGB 12.2 01/25/2023   HCT 37.4 01/25/2023   PLT 227 01/25/2023   GLUCOSE 256 (H) 01/25/2023   CHOL 215 (H) 02/23/2022   TRIG 301.0 (H) 02/23/2022   HDL 34.70 (L) 02/23/2022   LDLDIRECT 135.0 02/23/2022   LDLCALC 81 01/02/2013   ALT 16 01/22/2023   AST 17 01/22/2023   NA 132 (L) 01/25/2023   K 4.7 01/25/2023   CL 100 01/25/2023   CREATININE 1.13 (H) 01/25/2023   BUN 26 (H) 01/25/2023   CO2 26 01/25/2023   TSH 0.99 09/09/2021   INR 1.0 07/19/2019   HGBA1C 10.6 (H) 01/23/2023   MICROALBUR 0.9 07/19/2016    MR FOOT RIGHT W WO CONTRAST Result Date: 01/23/2023 CLINICAL DATA:  Foot swelling, diabetic, osteomyelitis suspected, xray done lateral midfoot ulcer EXAM: MRI OF THE RIGHT FOREFOOT WITHOUT AND WITH CONTRAST TECHNIQUE: Multiplanar, multisequence MR imaging of the right forefoot was performed before and after the administration of intravenous contrast. CONTRAST:  8mL GADAVIST  GADOBUTROL  1 MMOL/ML IV SOLN COMPARISON:  Multiple prior right foot radiographs, most recently 01/22/2023, MRI 03/11/2022 FINDINGS: Bones/Joint/Cartilage Postsurgical changes of prior fifth metatarsal base resection. There are sclerotic changes within the residual base of fifth metatarsal with mild marrow edema and enhancement. There is also mild marrow edema and enhancement within the adjacent lateral aspect of the cuboid with chronic cortical erosion of the cuboid. Marrow signal abnormality is slightly worsened in comparison to prior MRI in May 2023.  Ligaments Ankle ligaments are poorly evaluated due to scan obliquity. Muscles and Tendons Postsurgical changes of the peroneal brevis. Peroneal longus tendon appears intact. Soft tissues Lateral midfoot ulcer with adjacent skin thickening and underlying soft tissue swelling. There is no organized fluid collection. IMPRESSION: Lateral midfoot ulcer with postsurgical changes of prior base of fifth metatarsal resection. Chronic osseous changes in the residual base of fifth metatarsal and lateral cuboid, with underlying marrow edema and mild enhancement suggesting reactive marrow change or early acute on chronic osteomyelitis. No evidence of soft tissue abscess. Electronically Signed   By: Jacob  Kahn M.D.   On: 01/23/2023 15:48   DG Tibia/Fibula Right Result Date: 01/22/2023 CLINICAL DATA:  Ulcer and infection EXAM: RIGHT TIBIA AND FIBULA - 2 VIEW COMPARISON:  None Available. FINDINGS: Diffuse bone demineralization. No evidence of acute fracture or dislocation. No focal bone lesion or bone erosion. Soft tissues are unremarkable. IMPRESSION: No acute bony abnormalities. Electronically Signed   By: Boyce Byes M.D.   On: 01/22/2023 22:47   DG Foot Complete Right Result Date: 01/22/2023 CLINICAL DATA:  Ulcer and infection. EXAM: RIGHT FOOT COMPLETE - 3+ VIEW COMPARISON:  04/08/2022 FINDINGS: Diffuse bone demineralization. Previous resection or resorption of the distal right fifth metatarsal bone, unchanged. Bone erosion and sclerosis involving the base of the fifth metatarsal in the adjacent cuboidal bone likely representing osteomyelitis. Appearance is similar to prior study. No new areas of bone resorption or appreciated. Degenerative changes in the interphalangeal and intertarsal joints. No radiopaque soft tissue foreign bodies or soft tissue gas. IMPRESSION: Changes of chronic osteomyelitis suggested at the base of the fifth metatarsal and adjacent cuboidal bone similar to prior study. Soft tissue changes  are improving since the prior study.  Electronically Signed   By: Boyce Byes M.D.   On: 01/22/2023 22:46    Assessment & Plan:   Problem List Items Addressed This Visit     B12 deficiency - Primary   On B12      Anxiety disorder   Chronic Occ panic attacks Cont on Clonazepam  prn  Potential benefits of a long term benzodiazepines  use as well as potential risks  and complications were explained to the patient and were aknowledged.      LOW BACK PAIN   On Oxycodone  15 mg qid prn  Potential benefits of a long term opioids use as well as potential risks (i.e. addiction risk, apnea etc) and complications (i.e. Somnolence, constipation and others) were explained to the patient and were aknowledged.  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 pseudoephedrine  (SUDAFED) 120 MG 12 hr tablet    Sig: Take 1 tablet (120 mg total) by mouth every 12 (twelve) hours as needed for congestion.    Dispense:  60 tablet    Refill:  3  . clonazePAM  (KLONOPIN ) 0.5 MG tablet    Sig: Take 1 tablet (0.5 mg total) by mouth 2 (two) times daily as needed for anxiety.    Dispense:  60 tablet    Refill:  1  . oxyCODONE  (ROXICODONE ) 15 MG immediate release tablet    Sig: Take 1 tablet (15 mg total) by mouth every 6 (six) hours as needed for pain.    Dispense:  120 tablet    Refill:  0    Please fill on or after 02/21/24      Follow-up: No follow-ups on file.  Anitra Barn, MD

## 2024-02-21 NOTE — Assessment & Plan Note (Signed)
 Pt is seeing Endo - Dr Talmage Nap Labs were done last week A1c was 7.7%

## 2024-02-22 ENCOUNTER — Telehealth: Payer: Self-pay

## 2024-02-22 NOTE — Telephone Encounter (Signed)
 Copied from CRM [id number]. Topic: Clinical - Medication Question >> Feb 21, 2024  4:31 PM Caliyah H wrote: Reason for [id number] called regarding her medication, Sudafed, not being covered by her insurance due to the low dosage. She asked if an alternative medication could be prescribed that would be covered by her insurance.  Preferred Pharmacy [facility] [phone] - [place] - [address] [place] [address] Phone: [phone] Fax: [phone]

## 2024-02-23 ENCOUNTER — Telehealth: Payer: Self-pay | Admitting: Internal Medicine

## 2024-02-23 NOTE — Telephone Encounter (Signed)
 MyChart message sent to patient.

## 2024-02-23 NOTE — Telephone Encounter (Signed)
 Copied from CRM [id number]. Topic: Clinical - Prescription Issue >> Feb 23, 2024 10:57 AM Jethro Morrison wrote: Reason for CRM: PT CALLING BACK ABOUT PRESCRIPTION FOR SUDAFED THAT HER INSURANCE IS NOT COVERING. STATED SHE IS HAVING SURGERY ON MONDAY AND NEEDS PRESCRIPTION TODAY FOR SOMETHING ELSE PREVIOUS EAV#[id number] FORWARD TO PROVIDER. I DID ADV PATIENT TO GIVE THEM UNTIL THE END OF THE DAY. SHE IS CHECKING TO SEE IF SOMETHING CAN BE DONE TO GET AN ALTERNATE MEDICATION.

## 2024-02-26 ENCOUNTER — Other Ambulatory Visit: Payer: Self-pay | Admitting: Podiatry

## 2024-02-26 DIAGNOSIS — M24574 Contracture, right foot: Secondary | ICD-10-CM | POA: Diagnosis not present

## 2024-02-26 DIAGNOSIS — M2031 Hallux varus (acquired), right foot: Secondary | ICD-10-CM | POA: Diagnosis not present

## 2024-02-26 DIAGNOSIS — M19071 Primary osteoarthritis, right ankle and foot: Secondary | ICD-10-CM | POA: Diagnosis not present

## 2024-02-26 MED ORDER — OXYCODONE-ACETAMINOPHEN 5-325 MG PO TABS: 1.0000 | ORAL_TABLET | ORAL | 0 refills | Status: DC | PRN

## 2024-02-26 MED ORDER — IBUPROFEN 800 MG PO TABS: 800.0000 mg | ORAL_TABLET | Freq: Four times a day (QID) | ORAL | 1 refills | Status: AC | PRN

## 2024-02-26 NOTE — Telephone Encounter (Signed)
 This was a standard dosage.  She needs to ask her pharmacist what alternative is covered. Thank you

## 2024-02-26 NOTE — Telephone Encounter (Signed)
 Attempted to reach patient, unable to reach or LVM

## 2024-03-06 ENCOUNTER — Ambulatory Visit: Admitting: Podiatry

## 2024-03-06 ENCOUNTER — Ambulatory Visit (INDEPENDENT_AMBULATORY_CARE_PROVIDER_SITE_OTHER)

## 2024-03-06 DIAGNOSIS — Z9889 Other specified postprocedural states: Secondary | ICD-10-CM

## 2024-03-06 DIAGNOSIS — M2031 Hallux varus (acquired), right foot: Secondary | ICD-10-CM | POA: Diagnosis not present

## 2024-03-06 NOTE — Progress Notes (Signed)
 Subjective:  Patient ID: Katherine Herrera, female    DOB: [birth year],  MRN: [id number]  Chief Complaint  Patient presents with   Routine Post Op    DOS 02/26/24 -RT HALLUS INTERPHALANGEAL JOINT FUSION W/FIXTURE    DOS: 02/26/2024 Procedure: Right hallux IPJ fusion  58 y.o. female returns for post-op check.  Patient states that she is doing okay denies any other acute complaints she does not cause her pain.  She is partial weightbearing to the heel.  Bandages clean dry and intact  Review of Systems: Negative except as noted in the HPI. Denies N/V/F/Ch.  Past Medical History:  Diagnosis Date   Anxiety    Arthritis    hands   B12 deficiency    Chronic constipation    Chronic pain syndrome    neck, lower back, and foot   COPD (chronic obstructive pulmonary disease) (HCC)    Diabetic ulcer of right foot (HCC)    Gout    possible   History of seizure    05-26-2020  per pt as child and last one in late teen's   History of sepsis    due to lower extremity cellulits 2019 left , right 03/ 2021   History of syncope    Hyperlipidemia    Hypothyroidism    Impaired range of motion of cervical spine    per pt hx cervical fusion C4 -- 7,  has to be careful about turning her head to the right can cause her to pass out   Insulin  dependent type 2 diabetes mellitus [facility])    endocrinologist--- dr Ronelle Coffee    (05-26-2020 per pt check's blood sugar twice dialy and at times a third time,  fasting am blood sugar's---- 200 -- 250)   LBP (low back pain)    DR Faylene Hoots   Migraine    Peripheral neuropathy    RBBB (right bundle branch block)    Tenosynovitis of foot and ankle 09/03/2013   TTS (tarsal tunnel syndrome) 2010   Left Foot [facility] in [place]   Vitamin D  deficiency    Wears glasses     Current Outpatient Medications:    albuterol  (VENTOLIN  HFA) 108 (90 Base) MCG/ACT inhaler, Inhale 1 puff into the lungs every 6 (six) hours as needed for shortness of breath., Disp: , Rfl:    Alcohol Swabs  (PHARMACIST CHOICE ALCOHOL) PADS, , Disp: , Rfl:    aspirin  325 MG tablet, Take 325 mg by mouth daily., Disp: , Rfl:    B-D INS SYR ULTRAFINE 1CC/31G 31G X 5/16" 1 ML MISC, USE AS DIRECTED, Disp: 100 each, Rfl: 0   baclofen  (LIORESAL ) 10 MG tablet, TAKE 2 TABLETS BY MOUTH TWICE  DAILY, Disp: 180 tablet, Rfl: 1   bisacodyl  (DULCOLAX) 5 MG EC tablet, Take 10 mg by mouth at bedtime., Disp: , Rfl:    Blood Glucose Monitoring Suppl (ONETOUCH VERIO) w/Device KIT, , Disp: , Rfl:    celecoxib  (CELEBREX ) 200 MG capsule, Take 1 capsule (200 mg total) by mouth 2 (two) times daily as needed. TAKE 1 CAPSULE TWICE DAILY AS NEEDED FOR PAIN WITH FOOD, Disp: 60 capsule, Rfl: 0   cholecalciferol  (VITAMIN D3) 25 MCG (1000 UNIT) tablet, Take 1,000 Units by mouth daily., Disp: , Rfl:    clonazePAM  (KLONOPIN ) 0.5 MG tablet, Take 1 tablet (0.5 mg total) by mouth 2 (two) times daily as needed for anxiety., Disp: 60 tablet, Rfl: 1   collagenase  (SANTYL ) 250 UNIT/GM ointment, Apply 1 Application topically daily.,  Disp: 15 g, Rfl: 0   Continuous Blood Gluc Sensor (FREESTYLE LIBRE 2 SENSOR) MISC, as directed subcutaneous every 14 days, Disp: , Rfl:    docusate sodium  (COLACE) 100 MG capsule, Take 100 mg by mouth at bedtime., Disp: , Rfl:    ezetimibe  (ZETIA ) 10 MG tablet, TAKE 1 TABLET BY MOUTH DAILY, Disp: 100 tablet, Rfl: 2   FLUoxetine  (PROZAC ) 10 MG capsule, TAKE 1 CAPSULE BY MOUTH EVERY DAY (Patient taking differently: Take 10 mg by mouth daily.), Disp: 90 capsule, Rfl: 2   gabapentin  (NEURONTIN ) 800 MG tablet, TAKE 1 TABLET BY MOUTH 4 TIMES  DAILY, Disp: 400 tablet, Rfl: 2   ibuprofen  (ADVIL ) 800 MG tablet, Take 1 tablet (800 mg total) by mouth every 6 (six) hours as needed., Disp: 60 tablet, Rfl: 1   ibuprofen  (ADVIL ) 800 MG tablet, Take 1 tablet (800 mg total) by mouth every 6 (six) hours as needed., Disp: 60 tablet, Rfl: 1   insulin  NPH-regular Human (70-30) 100 UNIT/ML injection, Inject 20 Units into the skin 2 (two)  times daily with a meal. (Patient taking differently: Inject 30-50 Units into the skin See admin instructions. Inject 30 units SQ in the morning and afternoon then inject 50 units SQ at night), Disp: 10 mL, Rfl: 11   lamoTRIgine  (LAMICTAL ) 25 MG tablet, TAKE 1 TABLET BY MOUTH TWICE  DAILY, Disp: 200 tablet, Rfl: 2   Lancet Devices (ACCU-CHEK SOFTCLIX) lancets, Test two times daily, Disp: 100 each, Rfl: 12   Lancets (ONETOUCH DELICA PLUS LANCET33G) MISC, , Disp: , Rfl:    levothyroxine  (SYNTHROID ) 75 MCG tablet, TAKE 1 TABLET BY MOUTH DAILY  BEFORE BREAKFAST, Disp: 100 tablet, Rfl: 2   lisinopril  (ZESTRIL ) 10 MG tablet, TAKE 1 TABLET BY MOUTH DAILY, Disp: 90 tablet, Rfl: 3   ondansetron  (ZOFRAN ) 4 MG tablet, Take 4 mg by mouth every 8 (eight) hours as needed for nausea or vomiting., Disp: , Rfl:    ONETOUCH VERIO test strip, 1 each 3 (three) times daily., Disp: , Rfl:    oxyCODONE  (ROXICODONE ) 15 MG immediate release tablet, Take 1 tablet (15 mg total) by mouth every 6 (six) hours as needed for pain., Disp: 120 tablet, Rfl: 0   oxyCODONE -acetaminophen  (PERCOCET) 5-325 MG tablet, Take 1 tablet by mouth every 4 (four) hours as needed for severe pain (pain score 7-10)., Disp: 30 tablet, Rfl: 0   pseudoephedrine  (SUDAFED) 120 MG 12 hr tablet, Take 1 tablet (120 mg total) by mouth every 12 (twelve) hours as needed for congestion., Disp: 60 tablet, Rfl: 3   rOPINIRole  (REQUIP ) 0.5 MG tablet, TAKE 1 TABLET BY MOUTH AT  BEDTIME, Disp: 100 tablet, Rfl: 2   sodium hypochlorite (DAKIN'S 1/2 STRENGTH) external solution, Irrigate with 1 Application as directed daily., Disp: 437 mL, Rfl: 0   vitamin B-12 (CYANOCOBALAMIN ) 1000 MCG tablet, Take 1,000 mcg by mouth daily., Disp: , Rfl:   Social History   Tobacco Use  Smoking Status Every Day   Current packs/day: 1.00   Average packs/day: 1 pack/day for 10.0 years (10.0 ttl pk-yrs)   Types: Cigarettes   Passive exposure: Current  Smokeless Tobacco Never  Tobacco  Comments   Trying to quit, smoking 1 ppd    Allergies  Allergen Reactions   Cephalexin Nausea And Vomiting   Demerol   [Meperidine  Hcl] Rash   Lovastatin Other (See Comments)    Possible myalgia    Metformin And Related Nausea And Vomiting   Sulfamethoxazole-Trimethoprim     Other  reaction(s): Unknown DILI, pancreatitis   Crestor  [Rosuvastatin  Calcium ]     myalgia   Hydromorphone      Other reaction(s): Unknown   Niacin Other (See Comments)    Unknown    Other Other (See Comments)   Hydromorphone  Hcl Itching    Patient has been tolerating Hydromorphone  tablets without adverse effect (07/09/19) Other reaction(s): Unknown   Paroxetine Other (See Comments)    Unknown    Objective:  There were no vitals filed for this visit. There is no height or weight on file to calculate BMI. Constitutional Well developed. Well nourished.  Vascular Foot warm and well perfused. Capillary refill normal to all digits.   Neurologic Normal speech. Oriented to person, place, and time. Epicritic sensation to light touch grossly present bilaterally.  Dermatologic Skin healing well without signs of infection. Skin edges well coapted without signs of infection.  Orthopedic: Tenderness to palpation noted about the surgical site.   Radiographs: 3 views of skeletally mature the right foot: Good correction alignment noted reduction of hallux malleus deformity noted.  No backing out of hardware or hardware failure noted Assessment:   1. Hallux malleus of right foot   2. Status post foot surgery    Plan:  Patient was evaluated and treated and all questions answered.  S/p foot surgery right -Progressing as expected post-operatively. -XR: See above -WB Status: Partial weightbearing in heel surgical shoe -Sutures: Intact.  No clinical signs of dehiscence noted no complication noted. -Medications: None -Foot redressed.  No follow-ups on file.

## 2024-03-08 DIAGNOSIS — E1165 Type 2 diabetes mellitus with hyperglycemia: Secondary | ICD-10-CM | POA: Diagnosis not present

## 2024-03-15 ENCOUNTER — Other Ambulatory Visit: Payer: Self-pay | Admitting: Internal Medicine

## 2024-03-20 ENCOUNTER — Ambulatory Visit (INDEPENDENT_AMBULATORY_CARE_PROVIDER_SITE_OTHER): Admitting: Podiatry

## 2024-03-20 DIAGNOSIS — M2031 Hallux varus (acquired), right foot: Secondary | ICD-10-CM

## 2024-03-20 DIAGNOSIS — Z9889 Other specified postprocedural states: Secondary | ICD-10-CM

## 2024-03-20 NOTE — Progress Notes (Signed)
 Subjective:  Patient ID: Katherine Herrera, female    DOB: 1966/07/13,  MRN: [id number]  Chief Complaint  Patient presents with   Routine Post Op    Rm 12 POV # 2 DOS 02/26/24 -RT HALLUS INTERPHALANGEAL JOINT FUSION W/FIXTURE. Pt states no pain and request toe nail trim.    DOS: 02/26/2024 Procedure: Right hallux IPJ fusion  58 y.o. female returns for post-op check.  Patient states that she is doing okay denies any other acute complaints she does not cause her pain.  She is partial weightbearing to the heel.  Bandages clean dry and intact  Review of Systems: Negative except as noted in the HPI. Denies N/V/F/Ch.  Past Medical History:  Diagnosis Date   Anxiety    Arthritis    hands   B12 deficiency    Chronic constipation    Chronic pain syndrome    neck, lower back, and foot   COPD (chronic obstructive pulmonary disease) (HCC)    Diabetic ulcer of right foot (HCC)    Gout    possible   History of seizure    05-26-2020  per pt as child and last one in late teen's   History of sepsis    due to lower extremity cellulits 2019 left , right 03/ 2021   History of syncope    Hyperlipidemia    Hypothyroidism    Impaired range of motion of cervical spine    per pt hx cervical fusion C4 -- 7,  has to be careful about turning her head to the right can cause her to pass out   Insulin  dependent type 2 diabetes mellitus [facility])    endocrinologist--- dr Ronelle Coffee    (05-26-2020 per pt check's blood sugar twice dialy and at times a third time,  fasting am blood sugar's---- 200 -- 250)   LBP (low back pain)    DR Faylene Hoots   Migraine    Peripheral neuropathy    RBBB (right bundle branch block)    Tenosynovitis of foot and ankle 09/03/2013   TTS (tarsal tunnel syndrome) 2010   Left Foot [facility] in [place]   Vitamin D  deficiency    Wears glasses     Current Outpatient Medications:    albuterol  (VENTOLIN  HFA) 108 (90 Base) MCG/ACT inhaler, Inhale 1 puff into the lungs every 6 (six) hours as needed for  shortness of breath., Disp: , Rfl:    Alcohol Swabs (PHARMACIST CHOICE ALCOHOL) PADS, , Disp: , Rfl:    aspirin  325 MG tablet, Take 325 mg by mouth daily., Disp: , Rfl:    B-D INS SYR ULTRAFINE 1CC/31G 31G X 5/16" 1 ML MISC, USE AS DIRECTED, Disp: 100 each, Rfl: 0   baclofen  (LIORESAL ) 10 MG tablet, TAKE 2 TABLETS BY MOUTH TWICE  DAILY, Disp: 180 tablet, Rfl: 1   bisacodyl  (DULCOLAX) 5 MG EC tablet, Take 10 mg by mouth at bedtime., Disp: , Rfl:    Blood Glucose Monitoring Suppl (ONETOUCH VERIO) w/Device KIT, , Disp: , Rfl:    celecoxib  (CELEBREX ) 200 MG capsule, Take 1 capsule (200 mg total) by mouth 2 (two) times daily as needed. TAKE 1 CAPSULE TWICE DAILY AS NEEDED FOR PAIN WITH FOOD, Disp: 60 capsule, Rfl: 0   cholecalciferol  (VITAMIN D3) 25 MCG (1000 UNIT) tablet, Take 1,000 Units by mouth daily., Disp: , Rfl:    clonazePAM  (KLONOPIN ) 0.5 MG tablet, Take 1 tablet (0.5 mg total) by mouth 2 (two) times daily as needed for anxiety., Disp: 60 tablet,  Rfl: 1   collagenase  (SANTYL ) 250 UNIT/GM ointment, Apply 1 Application topically daily., Disp: 15 g, Rfl: 0   Continuous Blood Gluc Sensor (FREESTYLE LIBRE 2 SENSOR) MISC, as directed subcutaneous every 14 days, Disp: , Rfl:    docusate sodium  (COLACE) 100 MG capsule, Take 100 mg by mouth at bedtime., Disp: , Rfl:    ezetimibe  (ZETIA ) 10 MG tablet, TAKE 1 TABLET BY MOUTH DAILY, Disp: 100 tablet, Rfl: 2   FLUoxetine  (PROZAC ) 10 MG capsule, TAKE 1 CAPSULE BY MOUTH EVERY DAY (Patient taking differently: Take 10 mg by mouth daily.), Disp: 90 capsule, Rfl: 2   gabapentin  (NEURONTIN ) 800 MG tablet, TAKE 1 TABLET BY MOUTH 4 TIMES  DAILY, Disp: 400 tablet, Rfl: 2   ibuprofen  (ADVIL ) 800 MG tablet, Take 1 tablet (800 mg total) by mouth every 6 (six) hours as needed., Disp: 60 tablet, Rfl: 1   ibuprofen  (ADVIL ) 800 MG tablet, Take 1 tablet (800 mg total) by mouth every 6 (six) hours as needed., Disp: 60 tablet, Rfl: 1   insulin  NPH-regular Human (70-30) 100  UNIT/ML injection, Inject 20 Units into the skin 2 (two) times daily with a meal. (Patient taking differently: Inject 30-50 Units into the skin See admin instructions. Inject 30 units SQ in the morning and afternoon then inject 50 units SQ at night), Disp: 10 mL, Rfl: 11   lamoTRIgine  (LAMICTAL ) 25 MG tablet, TAKE 1 TABLET BY MOUTH TWICE  DAILY, Disp: 200 tablet, Rfl: 2   Lancet Devices (ACCU-CHEK SOFTCLIX) lancets, Test two times daily, Disp: 100 each, Rfl: 12   Lancets (ONETOUCH DELICA PLUS LANCET33G) MISC, , Disp: , Rfl:    levothyroxine  (SYNTHROID ) 75 MCG tablet, TAKE 1 TABLET BY MOUTH DAILY  BEFORE BREAKFAST, Disp: 100 tablet, Rfl: 2   lisinopril  (ZESTRIL ) 10 MG tablet, TAKE 1 TABLET BY MOUTH DAILY, Disp: 90 tablet, Rfl: 3   ondansetron  (ZOFRAN ) 4 MG tablet, Take 4 mg by mouth every 8 (eight) hours as needed for nausea or vomiting., Disp: , Rfl:    ONETOUCH VERIO test strip, 1 each 3 (three) times daily., Disp: , Rfl:    oxyCODONE  (ROXICODONE ) 15 MG immediate release tablet, Take 1 tablet (15 mg total) by mouth every 6 (six) hours as needed for pain., Disp: 120 tablet, Rfl: 0   oxyCODONE -acetaminophen  (PERCOCET) 5-325 MG tablet, Take 1 tablet by mouth every 4 (four) hours as needed for severe pain (pain score 7-10)., Disp: 30 tablet, Rfl: 0   pseudoephedrine  (SUDAFED) 120 MG 12 hr tablet, Take 1 tablet (120 mg total) by mouth every 12 (twelve) hours as needed for congestion., Disp: 60 tablet, Rfl: 3   rOPINIRole  (REQUIP ) 0.5 MG tablet, TAKE 1 TABLET BY MOUTH AT  BEDTIME, Disp: 100 tablet, Rfl: 2   sodium hypochlorite (DAKIN'S 1/2 STRENGTH) external solution, Irrigate with 1 Application as directed daily., Disp: 437 mL, Rfl: 0   vitamin B-12 (CYANOCOBALAMIN ) 1000 MCG tablet, Take 1,000 mcg by mouth daily., Disp: , Rfl:   Social History   Tobacco Use  Smoking Status Every Day   Current packs/day: 1.00   Average packs/day: 1 pack/day for 10.0 years (10.0 ttl pk-yrs)   Types: Cigarettes    Passive exposure: Current  Smokeless Tobacco Never  Tobacco Comments   Trying to quit, smoking 1 ppd    Allergies  Allergen Reactions   Cephalexin Nausea And Vomiting   Demerol   [Meperidine  Hcl] Rash   Lovastatin Other (See Comments)    Possible myalgia  Metformin And Related Nausea And Vomiting   Sulfamethoxazole-Trimethoprim     Other reaction(s): Unknown DILI, pancreatitis   Crestor  [Rosuvastatin  Calcium ]     myalgia   Hydromorphone      Other reaction(s): Unknown   Niacin Other (See Comments)    Unknown    Other Other (See Comments)   Hydromorphone  Hcl Itching    Patient has been tolerating Hydromorphone  tablets without adverse effect (07/09/19) Other reaction(s): Unknown   Paroxetine Other (See Comments)    Unknown    Objective:  There were no vitals filed for this visit. There is no height or weight on file to calculate BMI. Constitutional Well developed. Well nourished.  Vascular Foot warm and well perfused. Capillary refill normal to all digits.   Neurologic Normal speech. Oriented to person, place, and time. Epicritic sensation to light touch grossly present bilaterally.  Dermatologic Skin completely epithelialized.  No signs of Deis is noted no complication noted.  Reduction of deformity noted.  Orthopedic: Tenderness to palpation noted about the surgical site.   Radiographs: 3 views of skeletally mature the right foot: Good correction alignment noted reduction of hallux malleus deformity noted.  No backing out of hardware or hardware failure noted Assessment:   1. Hallux malleus of right foot   2. Status post foot surgery     Plan:  Patient was evaluated and treated and all questions answered.  S/p foot surgery right -Progressing as expected post-operatively. -XR: See above -WB Status: Weight-bear as tolerated in regular shoes -Sutures: Removed no clinical signs of dehiscence noted no complication noted. -Medications: None -Foot redressed.  No  follow-ups on file.

## 2024-04-08 DIAGNOSIS — E1165 Type 2 diabetes mellitus with hyperglycemia: Secondary | ICD-10-CM | POA: Diagnosis not present

## 2024-04-22 DIAGNOSIS — E039 Hypothyroidism, unspecified: Secondary | ICD-10-CM | POA: Diagnosis not present

## 2024-04-22 DIAGNOSIS — I10 Essential (primary) hypertension: Secondary | ICD-10-CM | POA: Diagnosis not present

## 2024-04-22 DIAGNOSIS — E7800 Pure hypercholesterolemia, unspecified: Secondary | ICD-10-CM | POA: Diagnosis not present

## 2024-04-22 DIAGNOSIS — E1165 Type 2 diabetes mellitus with hyperglycemia: Secondary | ICD-10-CM | POA: Diagnosis not present

## 2024-04-22 DIAGNOSIS — N189 Chronic kidney disease, unspecified: Secondary | ICD-10-CM | POA: Diagnosis not present

## 2024-04-22 DIAGNOSIS — E1140 Type 2 diabetes mellitus with diabetic neuropathy, unspecified: Secondary | ICD-10-CM | POA: Diagnosis not present

## 2024-05-08 DIAGNOSIS — E1165 Type 2 diabetes mellitus with hyperglycemia: Secondary | ICD-10-CM | POA: Diagnosis not present

## 2024-05-15 ENCOUNTER — Ambulatory Visit (INDEPENDENT_AMBULATORY_CARE_PROVIDER_SITE_OTHER): Admitting: Podiatry

## 2024-05-15 DIAGNOSIS — L97411 Non-pressure chronic ulcer of right heel and midfoot limited to breakdown of skin: Secondary | ICD-10-CM | POA: Diagnosis not present

## 2024-05-15 DIAGNOSIS — E11621 Type 2 diabetes mellitus with foot ulcer: Secondary | ICD-10-CM | POA: Diagnosis not present

## 2024-05-15 NOTE — Progress Notes (Signed)
 Subjective:  Patient ID: Katherine Herrera, female    DOB: 09-25-1966,  MRN: [id number]  Chief Complaint  Patient presents with   Hallux malleus of right foot    Pt stated that she is doing okay she stated that the spot has opened back up     58 y.o. female presents for wound care.  Patient presents with complaint of right fifth metatarsal base ulceration.  Patient states the wound started opening back up again.  She was doing good but started opening it back up wanted to get it evaluated denies any other acute complaints   Review of Systems: Negative except as noted in the HPI. Denies N/V/F/Ch.  Past Medical History:  Diagnosis Date   Anxiety    Arthritis    hands   B12 deficiency    Chronic constipation    Chronic pain syndrome    neck, lower back, and foot   COPD (chronic obstructive pulmonary disease) (HCC)    Diabetic ulcer of right foot (HCC)    Gout    possible   History of seizure    05-26-2020  per pt as child and last one in late teen's   History of sepsis    due to lower extremity cellulits 2019 left , right 03/ 2021   History of syncope    Hyperlipidemia    Hypothyroidism    Impaired range of motion of cervical spine    per pt hx cervical fusion C4 -- 7,  has to be careful about turning her head to the right can cause her to pass out   Insulin  dependent type 2 diabetes mellitus [facility])    endocrinologist--- dr tommas    (05-26-2020 per pt check's blood sugar twice dialy and at times a third time,  fasting am blood sugar's---- 200 -- 250)   LBP (low back pain)    DR GUST   Migraine    Peripheral neuropathy    RBBB (right bundle branch block)    Tenosynovitis of foot and ankle 09/03/2013   TTS (tarsal tunnel syndrome) 2010   Left Foot [facility] in [place]   Vitamin D  deficiency    Wears glasses     Current Outpatient Medications:    albuterol  (VENTOLIN  HFA) 108 (90 Base) MCG/ACT inhaler, Inhale 1 puff into the lungs every 6 (six) hours as needed for shortness of  breath., Disp: , Rfl:    Alcohol Swabs (PHARMACIST CHOICE ALCOHOL) PADS, , Disp: , Rfl:    aspirin  325 MG tablet, Take 325 mg by mouth daily., Disp: , Rfl:    B-D INS SYR ULTRAFINE 1CC/31G 31G X 5/16 1 ML MISC, USE AS DIRECTED, Disp: 100 each, Rfl: 0   baclofen  (LIORESAL ) 10 MG tablet, TAKE 2 TABLETS BY MOUTH TWICE  DAILY, Disp: 180 tablet, Rfl: 1   bisacodyl  (DULCOLAX) 5 MG EC tablet, Take 10 mg by mouth at bedtime., Disp: , Rfl:    Blood Glucose Monitoring Suppl (ONETOUCH VERIO) w/Device KIT, , Disp: , Rfl:    cholecalciferol  (VITAMIN D3) 25 MCG (1000 UNIT) tablet, Take 1,000 Units by mouth daily., Disp: , Rfl:    clonazePAM  (KLONOPIN ) 0.5 MG tablet, Take 1 tablet (0.5 mg total) by mouth 2 (two) times daily as needed for anxiety., Disp: 180 tablet, Rfl: 1   collagenase  (SANTYL ) 250 UNIT/GM ointment, Apply 1 Application topically daily., Disp: 15 g, Rfl: 0   Continuous Blood Gluc Sensor (FREESTYLE LIBRE 2 SENSOR) MISC, as directed subcutaneous every 14 days, Disp: ,  Rfl:    docusate sodium  (COLACE) 100 MG capsule, Take 100 mg by mouth at bedtime., Disp: , Rfl:    ezetimibe  (ZETIA ) 10 MG tablet, TAKE 1 TABLET BY MOUTH DAILY, Disp: 100 tablet, Rfl: 2   FLUoxetine  (PROZAC ) 10 MG capsule, Take 1 capsule (10 mg total) by mouth daily., Disp: 90 capsule, Rfl: 3   gabapentin  (NEURONTIN ) 800 MG tablet, Take 1 tablet (800 mg total) by mouth 4 (four) times daily., Disp: 400 tablet, Rfl: 2   ibuprofen  (ADVIL ) 800 MG tablet, Take 1 tablet (800 mg total) by mouth every 6 (six) hours as needed., Disp: 60 tablet, Rfl: 1   ibuprofen  (ADVIL ) 800 MG tablet, Take 1 tablet (800 mg total) by mouth every 6 (six) hours as needed., Disp: 60 tablet, Rfl: 1   insulin  NPH-regular Human (70-30) 100 UNIT/ML injection, Inject 20 Units into the skin 2 (two) times daily with a meal. (Patient taking differently: Inject 30-50 Units into the skin See admin instructions. Inject 30 units SQ in the morning and afternoon then inject 50  units SQ at night), Disp: 10 mL, Rfl: 11   lamoTRIgine  (LAMICTAL ) 25 MG tablet, TAKE 1 TABLET BY MOUTH TWICE  DAILY, Disp: 200 tablet, Rfl: 2   Lancet Devices (ACCU-CHEK SOFTCLIX) lancets, Test two times daily, Disp: 100 each, Rfl: 12   Lancets (ONETOUCH DELICA PLUS LANCET33G) MISC, , Disp: , Rfl:    levothyroxine  (SYNTHROID ) 75 MCG tablet, TAKE 1 TABLET BY MOUTH DAILY  BEFORE BREAKFAST, Disp: 100 tablet, Rfl: 2   lisinopril  (ZESTRIL ) 10 MG tablet, TAKE 1 TABLET BY MOUTH DAILY, Disp: 90 tablet, Rfl: 3   ondansetron  (ZOFRAN ) 4 MG tablet, Take 4 mg by mouth every 8 (eight) hours as needed for nausea or vomiting., Disp: , Rfl:    ONETOUCH VERIO test strip, 1 each 3 (three) times daily., Disp: , Rfl:    oxyCODONE  (ROXICODONE ) 15 MG immediate release tablet, Take 1 tablet (15 mg total) by mouth every 6 (six) hours as needed for pain., Disp: 240 tablet, Rfl: 0   pseudoephedrine  (SUDAFED) 120 MG 12 hr tablet, Take 1 tablet (120 mg total) by mouth every 12 (twelve) hours as needed for congestion., Disp: 60 tablet, Rfl: 3   rOPINIRole  (REQUIP ) 0.5 MG tablet, TAKE 1 TABLET BY MOUTH AT  BEDTIME, Disp: 100 tablet, Rfl: 2   sodium hypochlorite (DAKIN'S 1/2 STRENGTH) external solution, Irrigate with 1 Application as directed daily., Disp: 437 mL, Rfl: 0   vitamin B-12 (CYANOCOBALAMIN ) 1000 MCG tablet, Take 1,000 mcg by mouth daily., Disp: , Rfl:   Social History   Tobacco Use  Smoking Status Every Day   Current packs/day: 1.00   Average packs/day: 1 pack/day for 10.0 years (10.0 ttl pk-yrs)   Types: Cigarettes   Passive exposure: Current  Smokeless Tobacco Never  Tobacco Comments   Trying to quit, smoking 1 ppd    Allergies  Allergen Reactions   Cephalexin Nausea And Vomiting   Demerol   [Meperidine  Hcl] Rash   Lovastatin Other (See Comments)    Possible myalgia    Metformin And Related Nausea And Vomiting   Sulfamethoxazole-Trimethoprim     Other reaction(s): Unknown DILI, pancreatitis    Crestor  [Rosuvastatin  Calcium ]     myalgia   Hydromorphone      Other reaction(s): Unknown   Niacin Other (See Comments)    Unknown    Other Other (See Comments)   Hydromorphone  Hcl Itching    Patient has been tolerating Hydromorphone  tablets without adverse  effect (07/09/19) Other reaction(s): Unknown   Paroxetine Other (See Comments)    Unknown    Objective:  There were no vitals filed for this visit. There is no height or weight on file to calculate BMI. Constitutional Well developed. Well nourished.  Vascular Dorsalis pedis pulses palpable bilaterally. Posterior tibial pulses palpable bilaterally. Capillary refill normal to all digits.  No cyanosis or clubbing noted. Pedal hair growth normal.  Neurologic Normal speech. Oriented to person, place, and time. Protective sensation absent  Dermatologic Wound Location: Right plantar midfoot ulceration fat layer exposed.  No probe to bone noted.  Fibrogranular wound base noted.  No purulent drainage noted Wound Base: Mixed Granular/Fibrotic Peri-wound: Calloused Exudate: Scant/small amount Serosanguinous exudate Wound Measurements: - See below  Orthopedic: No pain to palpation either foot.   Radiographs: None Assessment:   1. Diabetic ulcer of right midfoot associated with type 2 diabetes mellitus, limited to breakdown of skin (HCC)    Plan:  Patient was evaluated and treated and all questions answered.  Ulcer right plantar midfoot ulcer with fat layer exposed -Debridement as below. -Dressed with Betadine wet-to-dry dressing, DSD. -Continue off-loading with surgical shoe.  Procedure: Excisional Debridement of Wound Tool: Sharp chisel blade/tissue nipper Rationale: Removal of non-viable soft tissue from the wound to promote healing.  Anesthesia: none Pre-Debridement Wound Measurements: 3 cm x 2.5 cm x 0.3 cm  Post-Debridement Wound Measurements: 3.2 cm x 2.7 cm x 0.3 cm  Type of Debridement: Sharp Excisional Tissue  Removed: Non-viable soft tissue Blood loss: Minimal (<50cc) Depth of Debridement: subcutaneous tissue. Technique: Sharp excisional debridement to bleeding, viable wound base.  Wound Progress: This my initial evaluation will continue to monitor the progression of the wound Site healing conversation 7 Dressing: Dry, sterile, compression dressing. Disposition: Patient tolerated procedure well. Patient to return in 1 week for follow-up.  No follow-ups on file.

## 2024-05-20 DIAGNOSIS — E7800 Pure hypercholesterolemia, unspecified: Secondary | ICD-10-CM | POA: Diagnosis not present

## 2024-05-20 DIAGNOSIS — E039 Hypothyroidism, unspecified: Secondary | ICD-10-CM | POA: Diagnosis not present

## 2024-05-20 DIAGNOSIS — E1140 Type 2 diabetes mellitus with diabetic neuropathy, unspecified: Secondary | ICD-10-CM | POA: Diagnosis not present

## 2024-05-20 DIAGNOSIS — E1165 Type 2 diabetes mellitus with hyperglycemia: Secondary | ICD-10-CM | POA: Diagnosis not present

## 2024-05-20 DIAGNOSIS — I10 Essential (primary) hypertension: Secondary | ICD-10-CM | POA: Diagnosis not present

## 2024-05-20 DIAGNOSIS — N189 Chronic kidney disease, unspecified: Secondary | ICD-10-CM | POA: Diagnosis not present

## 2024-05-22 ENCOUNTER — Telehealth: Payer: Self-pay

## 2024-05-22 ENCOUNTER — Encounter: Payer: Self-pay | Admitting: Internal Medicine

## 2024-05-22 ENCOUNTER — Ambulatory Visit: Admitting: Internal Medicine

## 2024-05-22 VITALS — BP 109/71 | HR 79 | Temp 98.0°F | Ht 69.0 in | Wt 209.0 lb

## 2024-05-22 DIAGNOSIS — G894 Chronic pain syndrome: Secondary | ICD-10-CM | POA: Diagnosis not present

## 2024-05-22 DIAGNOSIS — E538 Deficiency of other specified B group vitamins: Secondary | ICD-10-CM

## 2024-05-22 DIAGNOSIS — F419 Anxiety disorder, unspecified: Secondary | ICD-10-CM

## 2024-05-22 MED ORDER — OXYCODONE HCL 15 MG PO TABS: 15.0000 mg | ORAL_TABLET | Freq: Four times a day (QID) | ORAL | 0 refills | Status: DC | PRN

## 2024-05-22 MED ORDER — CLONAZEPAM 0.5 MG PO TABS: 0.5000 mg | ORAL_TABLET | Freq: Two times a day (BID) | ORAL | 1 refills | Status: DC | PRN

## 2024-05-22 MED ORDER — FLUOXETINE HCL 10 MG PO CAPS: 10.0000 mg | ORAL_CAPSULE | Freq: Every day | ORAL | 3 refills | Status: AC

## 2024-05-22 MED ORDER — GABAPENTIN 800 MG PO TABS: 800.0000 mg | ORAL_TABLET | Freq: Four times a day (QID) | ORAL | 2 refills | Status: AC

## 2024-05-22 NOTE — Progress Notes (Signed)
 Subjective:  Patient ID: Katherine Herrera, female    DOB: 1965/12/31  Age: 58 y.o. MRN: [id number]  CC: Medical Management of Chronic Issues (3 mnth f/u, Medication refill and discuss amount )   HPI Katherine Herrera presents for LBP, OA, DM, neuropathy, anxiety Katherine Herrera is going to [facility] for 2 months next week  Outpatient Medications Prior to Visit  Medication Sig Dispense Refill   albuterol  (VENTOLIN  HFA) 108 (90 Base) MCG/ACT inhaler Inhale 1 puff into the lungs every 6 (six) hours as needed for shortness of breath.     Alcohol Swabs (PHARMACIST CHOICE ALCOHOL) PADS      aspirin  325 MG tablet Take 325 mg by mouth daily.     B-D INS SYR ULTRAFINE 1CC/31G 31G X 5/16 1 ML MISC USE AS DIRECTED 100 each 0   baclofen  (LIORESAL ) 10 MG tablet TAKE 2 TABLETS BY MOUTH TWICE  DAILY 180 tablet 1   bisacodyl  (DULCOLAX) 5 MG EC tablet Take 10 mg by mouth at bedtime.     Blood Glucose Monitoring Suppl (ONETOUCH VERIO) w/Device KIT      cholecalciferol  (VITAMIN D3) 25 MCG (1000 UNIT) tablet Take 1,000 Units by mouth daily.     collagenase  (SANTYL ) 250 UNIT/GM ointment Apply 1 Application topically daily. 15 g 0   Continuous Blood Gluc Sensor (FREESTYLE LIBRE 2 SENSOR) MISC as directed subcutaneous every 14 days     docusate sodium  (COLACE) 100 MG capsule Take 100 mg by mouth at bedtime.     ezetimibe  (ZETIA ) 10 MG tablet TAKE 1 TABLET BY MOUTH DAILY 100 tablet 2   ibuprofen  (ADVIL ) 800 MG tablet Take 1 tablet (800 mg total) by mouth every 6 (six) hours as needed. 60 tablet 1   ibuprofen  (ADVIL ) 800 MG tablet Take 1 tablet (800 mg total) by mouth every 6 (six) hours as needed. 60 tablet 1   insulin  NPH-regular Human (70-30) 100 UNIT/ML injection Inject 20 Units into the skin 2 (two) times daily with a meal. (Patient taking differently: Inject 30-50 Units into the skin See admin instructions. Inject 30 units SQ in the morning and afternoon then inject 50 units SQ at night) 10 mL 11   lamoTRIgine  (LAMICTAL ) 25  MG tablet TAKE 1 TABLET BY MOUTH TWICE  DAILY 200 tablet 2   Lancet Devices (ACCU-CHEK SOFTCLIX) lancets Test two times daily 100 each 12   Lancets (ONETOUCH DELICA PLUS LANCET33G) MISC      levothyroxine  (SYNTHROID ) 75 MCG tablet TAKE 1 TABLET BY MOUTH DAILY  BEFORE BREAKFAST 100 tablet 2   lisinopril  (ZESTRIL ) 10 MG tablet TAKE 1 TABLET BY MOUTH DAILY 90 tablet 3   ondansetron  (ZOFRAN ) 4 MG tablet Take 4 mg by mouth every 8 (eight) hours as needed for nausea or vomiting.     ONETOUCH VERIO test strip 1 each 3 (three) times daily.     pseudoephedrine  (SUDAFED) 120 MG 12 hr tablet Take 1 tablet (120 mg total) by mouth every 12 (twelve) hours as needed for congestion. 60 tablet 3   rOPINIRole  (REQUIP ) 0.5 MG tablet TAKE 1 TABLET BY MOUTH AT  BEDTIME 100 tablet 2   sodium hypochlorite (DAKIN'S 1/2 STRENGTH) external solution Irrigate with 1 Application as directed daily. 437 mL 0   vitamin B-12 (CYANOCOBALAMIN ) 1000 MCG tablet Take 1,000 mcg by mouth daily.     celecoxib  (CELEBREX ) 200 MG capsule Take 1 capsule (200 mg total) by mouth 2 (two) times daily as needed. TAKE 1 CAPSULE TWICE DAILY  AS NEEDED FOR PAIN WITH FOOD 60 capsule 0   clonazePAM  (KLONOPIN ) 0.5 MG tablet Take 1 tablet (0.5 mg total) by mouth 2 (two) times daily as needed for anxiety. 60 tablet 1   FLUoxetine  (PROZAC ) 10 MG capsule TAKE 1 CAPSULE BY MOUTH EVERY DAY (Patient taking differently: Take 10 mg by mouth daily.) 90 capsule 2   gabapentin  (NEURONTIN ) 800 MG tablet TAKE 1 TABLET BY MOUTH 4 TIMES  DAILY 400 tablet 2   oxyCODONE  (ROXICODONE ) 15 MG immediate release tablet Take 1 tablet (15 mg total) by mouth every 6 (six) hours as needed for pain. 120 tablet 0   oxyCODONE -acetaminophen  (PERCOCET) 5-325 MG tablet Take 1 tablet by mouth every 4 (four) hours as needed for severe pain (pain score 7-10). 30 tablet 0   No facility-administered medications prior to visit.    ROS: Review of Systems  Objective:  BP 109/71   Pulse  79   Temp 98 F (36.7 C) (Oral)   Ht 5' 9 (1.753 m)   Wt 209 lb (94.8 kg)   SpO2 99%   BMI 30.86 kg/m   BP Readings from Last 3 Encounters:  05/22/24 109/71  02/21/24 132/68  10/04/23 120/62    Wt Readings from Last 3 Encounters:  05/22/24 209 lb (94.8 kg)  02/21/24 209 lb 3.2 oz (94.9 kg)  01/03/24 212 lb (96.2 kg)    Physical Exam  Lab Results  Component Value Date   WBC 8.1 01/25/2023   HGB 12.2 01/25/2023   HCT 37.4 01/25/2023   PLT 227 01/25/2023   GLUCOSE 256 (H) 01/25/2023   CHOL 215 (H) 02/23/2022   TRIG 301.0 (H) 02/23/2022   HDL 34.70 (L) 02/23/2022   LDLDIRECT 135.0 02/23/2022   LDLCALC 81 01/02/2013   ALT 16 01/22/2023   AST 17 01/22/2023   NA 132 (L) 01/25/2023   K 4.7 01/25/2023   CL 100 01/25/2023   CREATININE 1.13 (H) 01/25/2023   BUN 26 (H) 01/25/2023   CO2 26 01/25/2023   TSH 0.99 09/09/2021   INR 1.0 07/19/2019   HGBA1C 10.6 (H) 01/23/2023   MICROALBUR 0.50 01/13/2014    MR FOOT RIGHT W WO CONTRAST Result Date: 01/23/2023 CLINICAL DATA:  Foot swelling, diabetic, osteomyelitis suspected, xray done lateral midfoot ulcer EXAM: MRI OF THE RIGHT FOREFOOT WITHOUT AND WITH CONTRAST TECHNIQUE: Multiplanar, multisequence MR imaging of the right forefoot was performed before and after the administration of intravenous contrast. CONTRAST:  8mL GADAVIST  GADOBUTROL  1 MMOL/ML IV SOLN COMPARISON:  Multiple prior right foot radiographs, most recently 01/22/2023, MRI 03/11/2022 FINDINGS: Bones/Joint/Cartilage Postsurgical changes of prior fifth metatarsal base resection. There are sclerotic changes within the residual base of fifth metatarsal with mild marrow edema and enhancement. There is also mild marrow edema and enhancement within the adjacent lateral aspect of the cuboid with chronic cortical erosion of the cuboid. Marrow signal abnormality is slightly worsened in comparison to prior MRI in May 2023. Ligaments Ankle ligaments are poorly evaluated due to scan  obliquity. Muscles and Tendons Postsurgical changes of the peroneal brevis. Peroneal longus tendon appears intact. Soft tissues Lateral midfoot ulcer with adjacent skin thickening and underlying soft tissue swelling. There is no organized fluid collection. IMPRESSION: Lateral midfoot ulcer with postsurgical changes of prior base of fifth metatarsal resection. Chronic osseous changes in the residual base of fifth metatarsal and lateral cuboid, with underlying marrow edema and mild enhancement suggesting reactive marrow change or early acute on chronic osteomyelitis. No evidence of soft tissue  abscess. Electronically Signed   By: Jacob  Kahn M.D.   On: 01/23/2023 15:48   DG Tibia/Fibula Right Result Date: 01/22/2023 CLINICAL DATA:  Ulcer and infection EXAM: RIGHT TIBIA AND FIBULA - 2 VIEW COMPARISON:  None Available. FINDINGS: Diffuse bone demineralization. No evidence of acute fracture or dislocation. No focal bone lesion or bone erosion. Soft tissues are unremarkable. IMPRESSION: No acute bony abnormalities. Electronically Signed   By: Elsie Gravely M.D.   On: 01/22/2023 22:47   DG Foot Complete Right Result Date: 01/22/2023 CLINICAL DATA:  Ulcer and infection. EXAM: RIGHT FOOT COMPLETE - 3+ VIEW COMPARISON:  04/08/2022 FINDINGS: Diffuse bone demineralization. Previous resection or resorption of the distal right fifth metatarsal bone, unchanged. Bone erosion and sclerosis involving the base of the fifth metatarsal in the adjacent cuboidal bone likely representing osteomyelitis. Appearance is similar to prior study. No new areas of bone resorption or appreciated. Degenerative changes in the interphalangeal and intertarsal joints. No radiopaque soft tissue foreign bodies or soft tissue gas. IMPRESSION: Changes of chronic osteomyelitis suggested at the base of the fifth metatarsal and adjacent cuboidal bone similar to prior study. Soft tissue changes are improving since the prior study. Electronically Signed    By: Elsie Gravely M.D.   On: 01/22/2023 22:46    Assessment & Plan:   Problem List Items Addressed This Visit     Anxiety disorder   Relevant Medications   FLUoxetine  (PROZAC ) 10 MG capsule   B12 deficiency   Chronic pain - Primary   On OXYcodone  prn  Potential benefits of a long term opioids use as well as potential risks (i.e. addiction risk, apnea etc) and complications (i.e. Somnolence, constipation and others) were explained to the patient and were aknowledged. Aaleyah is going to [facility] for 2 months next week      Relevant Medications   clonazePAM  (KLONOPIN ) 0.5 MG tablet   FLUoxetine  (PROZAC ) 10 MG capsule   gabapentin  (NEURONTIN ) 800 MG tablet   oxyCODONE  (ROXICODONE ) 15 MG immediate release tablet      Meds ordered this encounter  Medications   clonazePAM  (KLONOPIN ) 0.5 MG tablet    Sig: Take 1 tablet (0.5 mg total) by mouth 2 (two) times daily as needed for anxiety.    Dispense:  180 tablet    Refill:  1    Mikaiah is going to [facility] for 2 months next week   FLUoxetine  (PROZAC ) 10 MG capsule    Sig: Take 1 capsule (10 mg total) by mouth daily.    Dispense:  90 capsule    Refill:  3   gabapentin  (NEURONTIN ) 800 MG tablet    Sig: Take 1 tablet (800 mg total) by mouth 4 (four) times daily.    Dispense:  400 tablet    Refill:  2   oxyCODONE  (ROXICODONE ) 15 MG immediate release tablet    Sig: Take 1 tablet (15 mg total) by mouth every 6 (six) hours as needed for pain.    Dispense:  240 tablet    Refill:  0    Code: G89.29. Please fill on or after 05/22/24. Tujuana is going to [facility] for 2 months next week      Follow-up: Return in about 2 months (around 07/23/2024) for a follow-up visit.  Marolyn Noel, MD

## 2024-05-22 NOTE — Assessment & Plan Note (Signed)
 On OXYcodone  prn  Potential benefits of a long term opioids use as well as potential risks (i.e. addiction risk, apnea etc) and complications (i.e. Somnolence, constipation and others) were explained to the patient and were aknowledged. Katherine Herrera is going to [facility] for 2 months next week

## 2024-05-22 NOTE — Telephone Encounter (Signed)
 Copied from CRM [id number]. Topic: Clinical - Prescription Issue >> May 22, 2024  4:39 PM Tiffany S wrote: Reason for CRM: oxyCODONE  (ROXICODONE ) 15 MG immediate release tablet [[id number]]  Has 2 red flags and need ICD 10 code  [id number]

## 2024-05-23 ENCOUNTER — Other Ambulatory Visit: Payer: Self-pay | Admitting: Internal Medicine

## 2024-05-23 NOTE — Addendum Note (Signed)
 Addended by: Alexei Ey V on: 05/23/2024 12:07 AM   Modules accepted: Level of Service

## 2024-05-24 NOTE — Telephone Encounter (Signed)
 Na

## 2024-05-24 NOTE — Telephone Encounter (Signed)
 Copied from CRM [id number]. Topic: Clinical - Medication Question >> May 24, 2024  1:15 PM Jasmin G wrote: Reason for CRM: Mr.Kim called from preferred pharmacy for some clarification, first she needs a  ICD20 diagnosis code on oxyCODONE  (ROXICODONE ) 15 MG immediate release tablet due to it being a high dose medication, and she would also like to know if PCP is aware that one of the medications prescribed will be taken with an opiod. Please call at [phone] to provide clarification.

## 2024-05-27 MED ORDER — OXYCODONE HCL 15 MG PO TABS
15.0000 mg | ORAL_TABLET | Freq: Four times a day (QID) | ORAL | 0 refills | Status: DC | PRN
Start: 2024-05-27 — End: 2024-07-04

## 2024-05-27 NOTE — Telephone Encounter (Signed)
 Spoke with representative at pharmacy. They were still requiring the diagnosis codes for the medications listed. They also wanted to inform us  (and will also inform patient) that patient will only be allowed half of the oxyCODONE  script as that is all that is allowed at one time.  This will cancel out the other half of the script and so patient will have to contact us  once this is up. [place]

## 2024-05-27 NOTE — Telephone Encounter (Signed)
 There was a cold on the prescription already.  I added another code-code: M54.50  Yes  Thank you

## 2024-05-27 NOTE — Addendum Note (Signed)
 Addended by: Notnamed Croucher V on: 05/27/2024 07:37 AM   Modules accepted: Orders

## 2024-05-28 NOTE — Telephone Encounter (Signed)
 Spoke with Pharmacy and was able to inform them of Dx code needed.

## 2024-05-28 NOTE — Telephone Encounter (Signed)
 Noted.  What medications do they require ICD10 code for?  Thanks

## 2024-05-31 ENCOUNTER — Other Ambulatory Visit: Payer: Self-pay | Admitting: Internal Medicine

## 2024-06-08 DIAGNOSIS — E1165 Type 2 diabetes mellitus with hyperglycemia: Secondary | ICD-10-CM | POA: Diagnosis not present

## 2024-06-12 ENCOUNTER — Ambulatory Visit: Admitting: Podiatry

## 2024-06-22 ENCOUNTER — Encounter: Payer: Self-pay | Admitting: Podiatry

## 2024-06-22 MED ORDER — DOXYCYCLINE HYCLATE 100 MG PO TABS: 100.0000 mg | ORAL_TABLET | Freq: Two times a day (BID) | ORAL | 0 refills | Status: AC

## 2024-07-04 ENCOUNTER — Other Ambulatory Visit: Payer: Self-pay | Admitting: Internal Medicine

## 2024-07-04 NOTE — Telephone Encounter (Unsigned)
 Copied from CRM [id number]. Topic: Clinical - Medication Refill >> Jul 04, 2024  1:50 PM Macario HERO wrote: Medication: oxyCODONE  (ROXICODONE ) 15 MG immediate release tablet [[id number]]  Has the patient contacted their pharmacy? No (Agent: If no, request that the patient contact the pharmacy for the refill. If patient does not wish to contact the pharmacy document the reason why and proceed with request.) (Agent: If yes, when and what did the pharmacy advise?)  This is the patient's preferred pharmacy:  [id number] [address], [place] - [address] [place] [address] Phone: [phone] Fax: [phone]    Is this the correct pharmacy for this prescription? Yes If no, delete pharmacy and type the correct one.   Has the prescription been filled recently? Yes  Is the patient out of the medication? Yes  Has the patient been seen for an appointment in the last year OR does the patient have an upcoming appointment? Yes  Can we respond through MyChart? No  Agent: Please be advised that Rx refills may take up to 3 business days. We ask that you follow-up with your pharmacy.

## 2024-07-05 MED ORDER — OXYCODONE HCL 15 MG PO TABS: 15.0000 mg | ORAL_TABLET | Freq: Four times a day (QID) | ORAL | 0 refills | Status: DC | PRN

## 2024-07-09 DIAGNOSIS — E1165 Type 2 diabetes mellitus with hyperglycemia: Secondary | ICD-10-CM | POA: Diagnosis not present

## 2024-07-15 ENCOUNTER — Other Ambulatory Visit: Payer: Self-pay | Admitting: Internal Medicine

## 2024-07-15 NOTE — Telephone Encounter (Unsigned)
 Copied from CRM [id number]. Topic: Clinical - Medication Refill >> Jul 15, 2024 10:23 AM [place] wrote: Medication: oxyCODONE  (ROXICODONE ) 15 MG immediate release tablet  Has the patient contacted their pharmacy? Yes (Agent: If no, request that the patient contact the pharmacy for the refill. If patient does not wish to contact the pharmacy document the reason why and proceed with request.) (Agent: If yes, when and what did the pharmacy advise?)  This is the patient's preferred pharmacy:  [id number], [place] - [address] [place] [address] Phone: [phone] Fax: [phone]   Is this the correct pharmacy for this prescription? Yes If no, delete pharmacy and type the correct one.   Has the prescription been filled recently? Yes  Is the patient out of the medication? Yes  Has the patient been seen for an appointment in the last year OR does the patient have an upcoming appointment? Yes  Can we respond through MyChart? Yes  Agent: Please be advised that Rx refills may take up to 3 business days. We ask that you follow-up with your pharmacy.

## 2024-07-16 MED ORDER — OXYCODONE HCL 15 MG PO TABS: 15.0000 mg | ORAL_TABLET | Freq: Four times a day (QID) | ORAL | 0 refills | Status: DC | PRN

## 2024-07-26 ENCOUNTER — Ambulatory Visit: Admitting: Podiatry

## 2024-08-08 ENCOUNTER — Other Ambulatory Visit: Payer: Self-pay | Admitting: Internal Medicine

## 2024-08-08 DIAGNOSIS — E1165 Type 2 diabetes mellitus with hyperglycemia: Secondary | ICD-10-CM | POA: Diagnosis not present

## 2024-08-08 NOTE — Telephone Encounter (Signed)
 Copied from CRM [id number]. Topic: Clinical - Medication Refill >> Aug 08, 2024  3:12 PM Paige D wrote: Medication: oxyCODONE  (ROXICODONE ) 15 MG immediate release tablet   ClonazePAM  (KLONOPIN ) 0.5 MG tablet   Has the patient contacted their pharmacy? No (Agent: If no, request that the patient contact the pharmacy for the refill. If patient does not wish to contact the pharmacy document the reason why and proceed with request.) (Agent: If yes, when and what did the pharmacy advise?)  This is the patient's preferred pharmacy:  [id number] [place],  - [address] [place] [address] Phone: [phone] Fax: [phone]  Is this the correct pharmacy for this prescription? Yes If no, delete pharmacy and type the correct one.   Has the prescription been filled recently? No  Is the patient out of the medication? Yes  Has the patient been seen for an appointment in the last year OR does the patient have an upcoming appointment? Yes  Can we respond through MyChart? No, prefer phone call   Agent: Please be advised that Rx refills may take up to 3 business days. We ask that you follow-up with your pharmacy.

## 2024-08-10 MED ORDER — CLONAZEPAM 0.5 MG PO TABS: 0.5000 mg | ORAL_TABLET | Freq: Two times a day (BID) | ORAL | 1 refills | Status: AC | PRN

## 2024-08-10 MED ORDER — OXYCODONE HCL 15 MG PO TABS: 15.0000 mg | ORAL_TABLET | Freq: Four times a day (QID) | ORAL | 0 refills | Status: DC | PRN

## 2024-08-21 ENCOUNTER — Ambulatory Visit: Admitting: Podiatry

## 2024-09-04 ENCOUNTER — Ambulatory Visit: Admitting: Podiatry

## 2024-09-13 ENCOUNTER — Ambulatory Visit (INDEPENDENT_AMBULATORY_CARE_PROVIDER_SITE_OTHER): Admitting: Podiatry

## 2024-09-13 DIAGNOSIS — E11621 Type 2 diabetes mellitus with foot ulcer: Secondary | ICD-10-CM

## 2024-09-13 DIAGNOSIS — L97411 Non-pressure chronic ulcer of right heel and midfoot limited to breakdown of skin: Secondary | ICD-10-CM | POA: Diagnosis not present

## 2024-09-13 NOTE — Progress Notes (Signed)
 Subjective:  Patient ID: Katherine Herrera, female    DOB: 08-28-1966,  MRN: [id number]  Chief Complaint  Patient presents with   Foot Ulcer    Right foot ulcer follow up, pt would also like to have her nails trimmed down     58 y.o. female presents for wound care.  Patient presents with complaint of right fifth metatarsal base ulceration.  Patient states the wound started opening back up again.  She was doing good but started opening it back up wanted to get it evaluated denies any other acute complaints   Review of Systems: Negative except as noted in the HPI. Denies N/V/F/Ch.  Past Medical History:  Diagnosis Date   Anxiety    Arthritis    hands   B12 deficiency    Chronic constipation    Chronic pain syndrome    neck, lower back, and foot   COPD (chronic obstructive pulmonary disease) (HCC)    Diabetic ulcer of right foot (HCC)    Gout    possible   History of seizure    05-26-2020  per pt as child and last one in late teen's   History of sepsis    due to lower extremity cellulits 2019 left , right 03/ 2021   History of syncope    Hyperlipidemia    Hypothyroidism    Impaired range of motion of cervical spine    per pt hx cervical fusion C4 -- 7,  has to be careful about turning her head to the right can cause her to pass out   Insulin  dependent type 2 diabetes mellitus [facility])    endocrinologist--- dr tommas    (05-26-2020 per pt check's blood sugar twice dialy and at times a third time,  fasting am blood sugar's---- 200 -- 250)   LBP (low back pain)    DR GUST   Migraine    Peripheral neuropathy    RBBB (right bundle branch block)    Tenosynovitis of foot and ankle 09/03/2013   TTS (tarsal tunnel syndrome) 2010   Left Foot [facility] in [place]   Vitamin D  deficiency    Wears glasses     Current Outpatient Medications:    albuterol  (VENTOLIN  HFA) 108 (90 Base) MCG/ACT inhaler, Inhale 1 puff into the lungs every 6 (six) hours as needed for shortness of breath., Disp: , Rfl:     Alcohol Swabs (PHARMACIST CHOICE ALCOHOL) PADS, , Disp: , Rfl:    aspirin  325 MG tablet, Take 325 mg by mouth daily., Disp: , Rfl:    B-D INS SYR ULTRAFINE 1CC/31G 31G X 5/16 1 ML MISC, USE AS DIRECTED, Disp: 100 each, Rfl: 0   baclofen  (LIORESAL ) 10 MG tablet, TAKE 2 TABLETS BY MOUTH TWICE  DAILY, Disp: 180 tablet, Rfl: 1   bisacodyl  (DULCOLAX) 5 MG EC tablet, Take 10 mg by mouth at bedtime., Disp: , Rfl:    Blood Glucose Monitoring Suppl (ONETOUCH VERIO) w/Device KIT, , Disp: , Rfl:    cholecalciferol  (VITAMIN D3) 25 MCG (1000 UNIT) tablet, Take 1,000 Units by mouth daily., Disp: , Rfl:    clonazePAM  (KLONOPIN ) 0.5 MG tablet, Take 1 tablet (0.5 mg total) by mouth 2 (two) times daily as needed for anxiety., Disp: 180 tablet, Rfl: 1   collagenase  (SANTYL ) 250 UNIT/GM ointment, Apply 1 Application topically daily., Disp: 15 g, Rfl: 0   Continuous Blood Gluc Sensor (FREESTYLE LIBRE 2 SENSOR) MISC, as directed subcutaneous every 14 days, Disp: , Rfl:  docusate sodium  (COLACE) 100 MG capsule, Take 100 mg by mouth at bedtime., Disp: , Rfl:    doxycycline  (VIBRA -TABS) 100 MG tablet, Take 1 tablet (100 mg total) by mouth 2 (two) times daily., Disp: 20 tablet, Rfl: 0   ezetimibe  (ZETIA ) 10 MG tablet, TAKE 1 TABLET BY MOUTH DAILY, Disp: 100 tablet, Rfl: 2   FLUoxetine  (PROZAC ) 10 MG capsule, Take 1 capsule (10 mg total) by mouth daily., Disp: 90 capsule, Rfl: 3   gabapentin  (NEURONTIN ) 800 MG tablet, Take 1 tablet (800 mg total) by mouth 4 (four) times daily., Disp: 400 tablet, Rfl: 2   ibuprofen  (ADVIL ) 800 MG tablet, Take 1 tablet (800 mg total) by mouth every 6 (six) hours as needed., Disp: 60 tablet, Rfl: 1   ibuprofen  (ADVIL ) 800 MG tablet, Take 1 tablet (800 mg total) by mouth every 6 (six) hours as needed., Disp: 60 tablet, Rfl: 1   insulin  NPH-regular Human (70-30) 100 UNIT/ML injection, Inject 20 Units into the skin 2 (two) times daily with a meal. (Patient taking differently: Inject 30-50  Units into the skin See admin instructions. Inject 30 units SQ in the morning and afternoon then inject 50 units SQ at night), Disp: 10 mL, Rfl: 11   lamoTRIgine  (LAMICTAL ) 25 MG tablet, TAKE 1 TABLET BY MOUTH TWICE  DAILY, Disp: 200 tablet, Rfl: 2   Lancet Devices (ACCU-CHEK SOFTCLIX) lancets, Test two times daily, Disp: 100 each, Rfl: 12   Lancets (ONETOUCH DELICA PLUS LANCET33G) MISC, , Disp: , Rfl:    levothyroxine  (SYNTHROID ) 75 MCG tablet, TAKE 1 TABLET BY MOUTH DAILY  BEFORE BREAKFAST, Disp: 100 tablet, Rfl: 2   lisinopril  (ZESTRIL ) 10 MG tablet, TAKE 1 TABLET BY MOUTH DAILY, Disp: 60 tablet, Rfl: 5   ondansetron  (ZOFRAN ) 4 MG tablet, Take 4 mg by mouth every 8 (eight) hours as needed for nausea or vomiting., Disp: , Rfl:    ONETOUCH VERIO test strip, 1 each 3 (three) times daily., Disp: , Rfl:    oxyCODONE  (ROXICODONE ) 15 MG immediate release tablet, Take 1 tablet (15 mg total) by mouth every 6 (six) hours as needed for pain., Disp: 120 tablet, Rfl: 0   pseudoephedrine  (SUDAFED) 120 MG 12 hr tablet, Take 1 tablet (120 mg total) by mouth every 12 (twelve) hours as needed for congestion., Disp: 60 tablet, Rfl: 3   rOPINIRole  (REQUIP ) 0.5 MG tablet, TAKE 1 TABLET BY MOUTH AT  BEDTIME, Disp: 100 tablet, Rfl: 2   sodium hypochlorite (DAKIN'S 1/2 STRENGTH) external solution, Irrigate with 1 Application as directed daily., Disp: 437 mL, Rfl: 0   vitamin B-12 (CYANOCOBALAMIN ) 1000 MCG tablet, Take 1,000 mcg by mouth daily., Disp: , Rfl:   Social History   Tobacco Use  Smoking Status Every Day   Current packs/day: 1.00   Average packs/day: 1 pack/day for 10.0 years (10.0 ttl pk-yrs)   Types: Cigarettes   Passive exposure: Current  Smokeless Tobacco Never  Tobacco Comments   Trying to quit, smoking 1 ppd    Allergies  Allergen Reactions   Cephalexin Nausea And Vomiting   Demerol   [Meperidine  Hcl] Rash   Lovastatin Other (See Comments)    Possible myalgia    Metformin And Related  Nausea And Vomiting   Sulfamethoxazole-Trimethoprim     Other reaction(s): Unknown DILI, pancreatitis   Crestor  [Rosuvastatin  Calcium ]     myalgia   Hydromorphone      Other reaction(s): Unknown   Niacin Other (See Comments)    Unknown  Other Other (See Comments)   Hydromorphone  Hcl Itching    Patient has been tolerating Hydromorphone  tablets without adverse effect (07/09/19) Other reaction(s): Unknown   Paroxetine Other (See Comments)    Unknown    Objective:  There were no vitals filed for this visit. There is no height or weight on file to calculate BMI. Constitutional Well developed. Well nourished.  Vascular Dorsalis pedis pulses palpable bilaterally. Posterior tibial pulses palpable bilaterally. Capillary refill normal to all digits.  No cyanosis or clubbing noted. Pedal hair growth normal.  Neurologic Normal speech. Oriented to person, place, and time. Protective sensation absent  Dermatologic Wound Location: Right plantar midfoot ulceration fat layer exposed.  No probe to bone noted.  Fibrogranular wound base noted.  No purulent drainage noted Wound Base: Mixed Granular/Fibrotic Peri-wound: Calloused Exudate: Scant/small amount Serosanguinous exudate Wound Measurements: - See below  Orthopedic: No pain to palpation either foot.   Radiographs: None Assessment:   No diagnosis found.  Plan:  Patient was evaluated and treated and all questions answered.  Ulcer right plantar midfoot ulcer with fat layer exposed -Debridement as below. -Dressed with Betadine wet-to-dry dressing, DSD. -Continue off-loading with surgical shoe.  Procedure: Excisional Debridement of Wound Tool: Sharp chisel blade/tissue nipper Rationale: Removal of non-viable soft tissue from the wound to promote healing.  Anesthesia: none Pre-Debridement Wound Measurements: 3 cm x 2.5 cm x 0.3 cm  Post-Debridement Wound Measurements: 3.2 cm x 2.7 cm x 0.3 cm  Type of Debridement: Sharp  Excisional Tissue Removed: Non-viable soft tissue Blood loss: Minimal (<50cc) Depth of Debridement: subcutaneous tissue. Technique: Sharp excisional debridement to bleeding, viable wound base.  Wound Progress: This my initial evaluation will continue to monitor the progression of the wound Site healing conversation 7 Dressing: Dry, sterile, compression dressing. Disposition: Patient tolerated procedure well. Patient to return in 1 week for follow-up.  No follow-ups on file.

## 2024-09-28 ENCOUNTER — Other Ambulatory Visit: Payer: Self-pay | Admitting: Internal Medicine

## 2024-10-09 ENCOUNTER — Ambulatory Visit: Admitting: Podiatry

## 2024-10-09 DIAGNOSIS — L97412 Non-pressure chronic ulcer of right heel and midfoot with fat layer exposed: Secondary | ICD-10-CM | POA: Diagnosis not present

## 2024-10-09 DIAGNOSIS — E11621 Type 2 diabetes mellitus with foot ulcer: Secondary | ICD-10-CM

## 2024-10-09 NOTE — Progress Notes (Signed)
 Subjective:  Patient ID: Katherine Herrera, female    DOB: Aug 10, 1966,  MRN: [id number]  Chief Complaint  Patient presents with   Diabetic Ulcer    Right foot ulcer follow up     58 y.o. female presents for wound care.  Patient presents with complaint of right fifth metatarsal base ulceration.  Patient states the wound started opening back up again.  She was doing good but started opening it back up wanted to get it evaluated denies any other acute complaints   Review of Systems: Negative except as noted in the HPI. Denies N/V/F/Ch.  Past Medical History:  Diagnosis Date   Anxiety    Arthritis    hands   B12 deficiency    Chronic constipation    Chronic pain syndrome    neck, lower back, and foot   COPD (chronic obstructive pulmonary disease) (HCC)    Diabetic ulcer of right foot (HCC)    Gout    possible   History of seizure    05-26-2020  per pt as child and last one in late teen's   History of sepsis    due to lower extremity cellulits 2019 left , right 03/ 2021   History of syncope    Hyperlipidemia    Hypothyroidism    Impaired range of motion of cervical spine    per pt hx cervical fusion C4 -- 7,  has to be careful about turning her head to the right can cause her to pass out   Insulin  dependent type 2 diabetes mellitus [facility])    endocrinologist--- dr tommas    (05-26-2020 per pt check's blood sugar twice dialy and at times a third time,  fasting am blood sugar's---- 200 -- 250)   LBP (low back pain)    DR GUST   Migraine    Peripheral neuropathy    RBBB (right bundle branch block)    Tenosynovitis of foot and ankle 09/03/2013   TTS (tarsal tunnel syndrome) 2010   Left Foot [place] in [place]   Vitamin D  deficiency    Wears glasses     Current Outpatient Medications:    albuterol  (VENTOLIN  HFA) 108 (90 Base) MCG/ACT inhaler, Inhale 1 puff into the lungs every 6 (six) hours as needed for shortness of breath., Disp: , Rfl:    Alcohol Swabs (PHARMACIST CHOICE ALCOHOL)  PADS, , Disp: , Rfl:    aspirin  325 MG tablet, Take 325 mg by mouth daily., Disp: , Rfl:    B-D INS SYR ULTRAFINE 1CC/31G 31G X 5/16 1 ML MISC, USE AS DIRECTED, Disp: 100 each, Rfl: 0   baclofen  (LIORESAL ) 10 MG tablet, TAKE 2 TABLETS BY MOUTH TWICE  DAILY, Disp: 180 tablet, Rfl: 1   bisacodyl  (DULCOLAX) 5 MG EC tablet, Take 10 mg by mouth at bedtime., Disp: , Rfl:    Blood Glucose Monitoring Suppl (ONETOUCH VERIO) w/Device KIT, , Disp: , Rfl:    cholecalciferol  (VITAMIN D3) 25 MCG (1000 UNIT) tablet, Take 1,000 Units by mouth daily., Disp: , Rfl:    clonazePAM  (KLONOPIN ) 0.5 MG tablet, Take 1 tablet (0.5 mg total) by mouth 2 (two) times daily as needed for anxiety., Disp: 180 tablet, Rfl: 1   collagenase  (SANTYL ) 250 UNIT/GM ointment, Apply 1 Application topically daily., Disp: 15 g, Rfl: 0   Continuous Blood Gluc Sensor (FREESTYLE LIBRE 2 SENSOR) MISC, as directed subcutaneous every 14 days, Disp: , Rfl:    docusate sodium  (COLACE) 100 MG capsule, Take 100 mg by  mouth at bedtime., Disp: , Rfl:    doxycycline  (VIBRA -TABS) 100 MG tablet, Take 1 tablet (100 mg total) by mouth 2 (two) times daily., Disp: 20 tablet, Rfl: 0   ezetimibe  (ZETIA ) 10 MG tablet, TAKE 1 TABLET BY MOUTH DAILY, Disp: 100 tablet, Rfl: 2   FLUoxetine  (PROZAC ) 10 MG capsule, Take 1 capsule (10 mg total) by mouth daily., Disp: 90 capsule, Rfl: 3   gabapentin  (NEURONTIN ) 800 MG tablet, Take 1 tablet (800 mg total) by mouth 4 (four) times daily., Disp: 400 tablet, Rfl: 2   ibuprofen  (ADVIL ) 800 MG tablet, Take 1 tablet (800 mg total) by mouth every 6 (six) hours as needed., Disp: 60 tablet, Rfl: 1   ibuprofen  (ADVIL ) 800 MG tablet, Take 1 tablet (800 mg total) by mouth every 6 (six) hours as needed., Disp: 60 tablet, Rfl: 1   insulin  NPH-regular Human (70-30) 100 UNIT/ML injection, Inject 20 Units into the skin 2 (two) times daily with a meal. (Patient taking differently: Inject 30-50 Units into the skin See admin instructions.  Inject 30 units SQ in the morning and afternoon then inject 50 units SQ at night), Disp: 10 mL, Rfl: 11   lamoTRIgine  (LAMICTAL ) 25 MG tablet, TAKE 1 TABLET BY MOUTH TWICE  DAILY, Disp: 200 tablet, Rfl: 2   Lancet Devices (ACCU-CHEK SOFTCLIX) lancets, Test two times daily, Disp: 100 each, Rfl: 12   Lancets (ONETOUCH DELICA PLUS LANCET33G) MISC, , Disp: , Rfl:    levothyroxine  (SYNTHROID ) 75 MCG tablet, TAKE 1 TABLET BY MOUTH DAILY  BEFORE BREAKFAST, Disp: 100 tablet, Rfl: 2   lisinopril  (ZESTRIL ) 10 MG tablet, TAKE 1 TABLET BY MOUTH DAILY, Disp: 60 tablet, Rfl: 5   ondansetron  (ZOFRAN ) 4 MG tablet, Take 4 mg by mouth every 8 (eight) hours as needed for nausea or vomiting., Disp: , Rfl:    ONETOUCH VERIO test strip, 1 each 3 (three) times daily., Disp: , Rfl:    oxyCODONE  (ROXICODONE ) 15 MG immediate release tablet, Take 1 tablet (15 mg total) by mouth every 6 (six) hours as needed for pain., Disp: 120 tablet, Rfl: 0   pseudoephedrine  (SUDAFED) 120 MG 12 hr tablet, Take 1 tablet (120 mg total) by mouth every 12 (twelve) hours as needed for congestion., Disp: 60 tablet, Rfl: 3   rOPINIRole  (REQUIP ) 0.5 MG tablet, TAKE 1 TABLET BY MOUTH AT  BEDTIME, Disp: 100 tablet, Rfl: 2   sodium hypochlorite (DAKIN'S 1/2 STRENGTH) external solution, Irrigate with 1 Application as directed daily., Disp: 437 mL, Rfl: 0   vitamin B-12 (CYANOCOBALAMIN ) 1000 MCG tablet, Take 1,000 mcg by mouth daily., Disp: , Rfl:   Social History   Tobacco Use  Smoking Status Every Day   Current packs/day: 1.00   Average packs/day: 1 pack/day for 10.0 years (10.0 ttl pk-yrs)   Types: Cigarettes   Passive exposure: Current  Smokeless Tobacco Never  Tobacco Comments   Trying to quit, smoking 1 ppd    Allergies  Allergen Reactions   Cephalexin Nausea And Vomiting   Demerol   [Meperidine  Hcl] Rash   Lovastatin Other (See Comments)    Possible myalgia    Metformin And Related Nausea And Vomiting    Sulfamethoxazole-Trimethoprim     Other reaction(s): Unknown DILI, pancreatitis   Crestor  [Rosuvastatin  Calcium ]     myalgia   Hydromorphone      Other reaction(s): Unknown   Niacin Other (See Comments)    Unknown    Other Other (See Comments)   Hydromorphone  Hcl Itching  Patient has been tolerating Hydromorphone  tablets without adverse effect (07/09/19) Other reaction(s): Unknown   Paroxetine Other (See Comments)    Unknown    Objective:  There were no vitals filed for this visit. There is no height or weight on file to calculate BMI. Constitutional Well developed. Well nourished.  Vascular Dorsalis pedis pulses palpable bilaterally. Posterior tibial pulses palpable bilaterally. Capillary refill normal to all digits.  No cyanosis or clubbing noted. Pedal hair growth normal.  Neurologic Normal speech. Oriented to person, place, and time. Protective sensation absent  Dermatologic Wound Location: Right plantar midfoot ulceration fat layer exposed.  No probe to bone noted.  Fibrogranular wound base noted.  No purulent drainage noted Wound Base: Mixed Granular/Fibrotic Peri-wound: Calloused Exudate: Scant/small amount Serosanguinous exudate Wound Measurements: - See below  Orthopedic: No pain to palpation either foot.   Radiographs: None Assessment:   1. Diabetic ulcer of right midfoot associated with type 2 diabetes mellitus, with fat layer exposed (HCC)     Plan:  Patient was evaluated and treated and all questions answered.  Ulcer right plantar midfoot ulcer with fat layer exposed -Debridement as below. -Dressed with Betadine wet-to-dry dressing, DSD. -Continue off-loading with surgical shoe.  Procedure: Excisional Debridement of Wound Tool: Sharp chisel blade/tissue nipper Rationale: Removal of non-viable soft tissue from the wound to promote healing.  Anesthesia: none Pre-Debridement Wound Measurements: 2 cm x 1.5 cm x 0.3 cm  Post-Debridement Wound  Measurements: 2.2 cm x 2.0 cm x 0.3 cm  Type of Debridement: Sharp Excisional Tissue Removed: Non-viable soft tissue Blood loss: Minimal (<50cc) Depth of Debridement: subcutaneous tissue. Technique: Sharp excisional debridement to bleeding, viable wound base.  Wound Progress: The wound decreased slightly.  Will continue to clinically monitor Site healing conversation 7 Dressing: Dry, sterile, compression dressing. Disposition: Patient tolerated procedure well. Patient to return in 1 week for follow-up.  No follow-ups on file.

## 2024-10-15 ENCOUNTER — Encounter: Payer: Self-pay | Admitting: Podiatry

## 2024-10-21 ENCOUNTER — Telehealth: Payer: Self-pay

## 2024-10-21 NOTE — Telephone Encounter (Signed)
 Copied from CRM [id number]. Topic: Clinical - Medication Question >> Oct 21, 2024  3:54 PM Alfonso ORN wrote: Reason for CRM: grandchildren has flu A was advised to get medication to help with if get it . Please call to advise [phone]

## 2024-10-22 ENCOUNTER — Other Ambulatory Visit: Payer: Self-pay | Admitting: Family

## 2024-10-22 MED ORDER — OSELTAMIVIR PHOSPHATE 75 MG PO CAPS: 75.0000 mg | ORAL_CAPSULE | Freq: Two times a day (BID) | ORAL | 0 refills | Status: AC

## 2024-10-22 NOTE — Telephone Encounter (Unsigned)
 Copied from CRM [id number]. Topic: Clinical - Medication Question >> Oct 21, 2024  3:54 PM Alfonso ORN wrote: Reason for CRM: grandchildren has flu A was advised to get medication to help with if get it . Please call to advise [phone] >> Oct 22, 2024 12:36 PM Alfonso ORN wrote: Pt called back for f/u on medication request. Advised waiting for pcp response. Please contact pt to update.

## 2024-10-22 NOTE — Telephone Encounter (Signed)
 Per covering provider Tamiflu  75 mg twice a day has been sent.

## 2024-10-26 ENCOUNTER — Other Ambulatory Visit: Payer: Self-pay | Admitting: Podiatry

## 2024-10-29 ENCOUNTER — Other Ambulatory Visit: Payer: Self-pay | Admitting: Internal Medicine

## 2024-10-29 NOTE — Telephone Encounter (Unsigned)
 Copied from CRM #[id number]. Topic: Clinical - Medication Refill >> Oct 29, 2024 11:49 AM Delon T wrote: Medication: oxyCODONE  (ROXICODONE ) 15 MG immediate release table  Has the patient contacted their pharmacy? No (Agent: If no, request that the patient contact the pharmacy for the refill. If patient does not wish to contact the pharmacy document the reason why and proceed with request.) (Agent: If yes, when and what did the pharmacy advise?)  This is the patient's preferred pharmacy:  [id number] [place], [place] - [address] [place] [address] Phone: [phone] Fax: [phone]    Is this the correct pharmacy for this prescription? Yes If no, delete pharmacy and type the correct one.   Has the prescription been filled recently? Yes  Is the patient out of the medication? Yes  Has the patient been seen for an appointment in the last year OR does the patient have an upcoming appointment? Yes  Can we respond through MyChart? Yes  Agent: Please be advised that Rx refills may take up to 3 business days. We ask that you follow-up with your pharmacy.

## 2024-10-30 MED ORDER — OXYCODONE HCL 15 MG PO TABS: 15.0000 mg | ORAL_TABLET | Freq: Four times a day (QID) | ORAL | 0 refills | Status: AC | PRN

## 2024-11-13 ENCOUNTER — Ambulatory Visit: Admitting: Podiatry

## 2024-11-15 ENCOUNTER — Other Ambulatory Visit: Payer: Self-pay | Admitting: Internal Medicine

## 2024-11-27 ENCOUNTER — Ambulatory Visit: Admitting: Podiatry

## 2024-12-04 ENCOUNTER — Telehealth: Payer: Self-pay

## 2024-12-04 NOTE — Telephone Encounter (Signed)
 Copied from CRM [id number]. Topic: Clinical - Medication Refill >> Dec 04, 2024 11:26 AM Alfonso ORN wrote: Medication:  oxyCODONE  (ROXICODONE ) 15 MG immediate release tablet    Has the patient contacted their pharmacy? yes   This is the patient's preferred pharmacy:  [id number] [place], [place] - [address] [facility] [place] [address] Phone: [phone] Fax: [phone]   Is this the correct pharmacy for this prescription? Yes If no, delete pharmacy and type the correct one.   Has the prescription been filled recently? No  Is the patient out of the medication? Yes  Has the patient been seen for an appointment in the last year OR does the patient have an upcoming appointment? Yes  Can we respond through MyChart? no  Agent: Please be advised that Rx refills may take up to 3 business days. We ask that you follow-up with your pharmacy.

## 2024-12-11 ENCOUNTER — Ambulatory Visit: Admitting: Podiatry

## 2025-02-24 ENCOUNTER — Ambulatory Visit

## 2025-02-24 ENCOUNTER — Encounter: Admitting: Internal Medicine

## 2025-05-08 ENCOUNTER — Ambulatory Visit
